# Patient Record
Sex: Male | Born: 1970 | Race: White | Hispanic: Yes | Smoking: Former smoker
Health system: Southern US, Community
[De-identification: ages and names within clinical notes are randomized; demographics above are authoritative.]

## PROBLEM LIST (undated history)

## (undated) DIAGNOSIS — A539 Syphilis, unspecified: Secondary | ICD-10-CM

## (undated) DIAGNOSIS — N186 End stage renal disease: Secondary | ICD-10-CM

## (undated) DIAGNOSIS — G479 Sleep disorder, unspecified: Secondary | ICD-10-CM

## (undated) DIAGNOSIS — K219 Gastro-esophageal reflux disease without esophagitis: Secondary | ICD-10-CM

## (undated) DIAGNOSIS — Z7189 Other specified counseling: Secondary | ICD-10-CM

## (undated) DIAGNOSIS — E1165 Type 2 diabetes mellitus with hyperglycemia: Secondary | ICD-10-CM

## (undated) DIAGNOSIS — R0989 Other specified symptoms and signs involving the circulatory and respiratory systems: Secondary | ICD-10-CM

## (undated) DIAGNOSIS — L299 Pruritus, unspecified: Secondary | ICD-10-CM

## (undated) DIAGNOSIS — F329 Major depressive disorder, single episode, unspecified: Secondary | ICD-10-CM

## (undated) DIAGNOSIS — R5381 Other malaise: Secondary | ICD-10-CM

## (undated) DIAGNOSIS — D72829 Elevated white blood cell count, unspecified: Secondary | ICD-10-CM

## (undated) DIAGNOSIS — I10 Essential (primary) hypertension: Secondary | ICD-10-CM

## (undated) DIAGNOSIS — D638 Anemia in other chronic diseases classified elsewhere: Secondary | ICD-10-CM

## (undated) DIAGNOSIS — F32A Depression, unspecified: Secondary | ICD-10-CM

## (undated) DIAGNOSIS — I639 Cerebral infarction, unspecified: Secondary | ICD-10-CM

## (undated) DIAGNOSIS — E785 Hyperlipidemia, unspecified: Secondary | ICD-10-CM

## (undated) DIAGNOSIS — D649 Anemia, unspecified: Secondary | ICD-10-CM

## (undated) DIAGNOSIS — R519 Headache, unspecified: Secondary | ICD-10-CM

## (undated) DIAGNOSIS — Z515 Encounter for palliative care: Secondary | ICD-10-CM

## (undated) DIAGNOSIS — F419 Anxiety disorder, unspecified: Secondary | ICD-10-CM

## (undated) DIAGNOSIS — R0600 Dyspnea, unspecified: Secondary | ICD-10-CM

## (undated) DIAGNOSIS — Z89512 Acquired absence of left leg below knee: Secondary | ICD-10-CM

## (undated) DIAGNOSIS — N5082 Scrotal pain: Secondary | ICD-10-CM

## (undated) DIAGNOSIS — E1151 Type 2 diabetes mellitus with diabetic peripheral angiopathy without gangrene: Secondary | ICD-10-CM

## (undated) DIAGNOSIS — K5901 Slow transit constipation: Secondary | ICD-10-CM

## (undated) DIAGNOSIS — G4730 Sleep apnea, unspecified: Secondary | ICD-10-CM

## (undated) DIAGNOSIS — N5089 Other specified disorders of the male genital organs: Secondary | ICD-10-CM

## (undated) DIAGNOSIS — R339 Retention of urine, unspecified: Secondary | ICD-10-CM

## (undated) HISTORY — DX: Acquired absence of left leg below knee: Z89.512

## (undated) HISTORY — DX: Pruritus, unspecified: L29.9

## (undated) HISTORY — DX: Other specified symptoms and signs involving the circulatory and respiratory systems: R09.89

## (undated) HISTORY — DX: Retention of urine, unspecified: R33.9

## (undated) HISTORY — DX: End stage renal disease: N18.6

## (undated) HISTORY — DX: Encounter for palliative care: Z51.5

## (undated) HISTORY — DX: Other disorders of calcium metabolism: E83.59

## (undated) HISTORY — DX: Sleep disorder, unspecified: G47.9

## (undated) HISTORY — DX: Essential (primary) hypertension: I10

## (undated) HISTORY — PX: EYE SURGERY: SHX253

## (undated) HISTORY — DX: Elevated white blood cell count, unspecified: D72.829

## (undated) HISTORY — DX: Anemia in other chronic diseases classified elsewhere: D63.8

## (undated) HISTORY — DX: Other specified counseling: Z71.89

## (undated) HISTORY — DX: Other specified disorders of the male genital organs: N50.89

## (undated) HISTORY — DX: Slow transit constipation: K59.01

## (undated) HISTORY — DX: Scrotal pain: N50.82

## (undated) HISTORY — DX: Type 2 diabetes mellitus with hyperglycemia: E11.65

## (undated) HISTORY — PX: OTHER SURGICAL HISTORY: SHX169

---
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
## 2013-07-04 DIAGNOSIS — F1090 Alcohol use, unspecified, uncomplicated: Secondary | ICD-10-CM

## 2013-07-04 DIAGNOSIS — Z7289 Other problems related to lifestyle: Secondary | ICD-10-CM | POA: Insufficient documentation

## 2013-07-04 DIAGNOSIS — IMO0001 Reserved for inherently not codable concepts without codable children: Secondary | ICD-10-CM

## 2013-07-04 DIAGNOSIS — E1169 Type 2 diabetes mellitus with other specified complication: Secondary | ICD-10-CM

## 2013-07-04 DIAGNOSIS — E669 Obesity, unspecified: Secondary | ICD-10-CM

## 2013-07-04 HISTORY — DX: Reserved for inherently not codable concepts without codable children: IMO0001

## 2013-07-04 HISTORY — DX: Alcohol use, unspecified, uncomplicated: F10.90

## 2013-07-04 HISTORY — DX: Type 2 diabetes mellitus with other specified complication: E11.69

## 2013-07-04 HISTORY — DX: Other problems related to lifestyle: Z72.89

## 2013-07-04 HISTORY — DX: Type 2 diabetes mellitus with other specified complication: E66.9

## 2013-07-20 DIAGNOSIS — Z89512 Acquired absence of left leg below knee: Secondary | ICD-10-CM | POA: Insufficient documentation

## 2013-09-22 DIAGNOSIS — I87309 Chronic venous hypertension (idiopathic) without complications of unspecified lower extremity: Secondary | ICD-10-CM

## 2013-09-22 HISTORY — DX: Chronic venous hypertension (idiopathic) without complications of unspecified lower extremity: I87.309

## 2013-12-14 DIAGNOSIS — E113599 Type 2 diabetes mellitus with proliferative diabetic retinopathy without macular edema, unspecified eye: Secondary | ICD-10-CM

## 2013-12-14 HISTORY — DX: Type 2 diabetes mellitus with proliferative diabetic retinopathy without macular edema, unspecified eye: E11.3599

## 2015-08-21 DIAGNOSIS — D509 Iron deficiency anemia, unspecified: Secondary | ICD-10-CM | POA: Diagnosis not present

## 2015-08-21 DIAGNOSIS — E119 Type 2 diabetes mellitus without complications: Secondary | ICD-10-CM | POA: Diagnosis not present

## 2017-02-12 ENCOUNTER — Ambulatory Visit (INDEPENDENT_AMBULATORY_CARE_PROVIDER_SITE_OTHER): Payer: Medicaid Other | Admitting: Sports Medicine

## 2017-02-12 ENCOUNTER — Encounter: Payer: Self-pay | Admitting: Sports Medicine

## 2017-02-12 VITALS — Ht 70.0 in | Wt 280.0 lb

## 2017-02-12 DIAGNOSIS — Z89512 Acquired absence of left leg below knee: Secondary | ICD-10-CM | POA: Diagnosis not present

## 2017-02-12 DIAGNOSIS — L601 Onycholysis: Secondary | ICD-10-CM

## 2017-02-12 DIAGNOSIS — B351 Tinea unguium: Secondary | ICD-10-CM

## 2017-02-12 DIAGNOSIS — M79676 Pain in unspecified toe(s): Secondary | ICD-10-CM

## 2017-02-12 DIAGNOSIS — E1142 Type 2 diabetes mellitus with diabetic polyneuropathy: Secondary | ICD-10-CM | POA: Diagnosis not present

## 2017-02-12 DIAGNOSIS — M79674 Pain in right toe(s): Secondary | ICD-10-CM

## 2017-02-12 NOTE — Progress Notes (Signed)
   Subjective:    [name], male    DOB: [birth year], 46 y.o.   MRN: [id number]  HPI   I taking a shower yesterday when I noticed my right great toe nail had turned black when I touched it the whole nail moved I am Diabetic so I panicked and came start here this morning for an appointment.     Review of Systems  Constitutional: Positive for fatigue.  HENT: Positive for ear pain.   Gastrointestinal: Positive for diarrhea.  Musculoskeletal: Positive for back pain.  Neurological: Positive for headaches.  All other systems reviewed and are negative.      Objective:   Physical Exam        Assessment & Plan:

## 2017-02-12 NOTE — Progress Notes (Signed)
Subjective: Michael Riggs is a 46 y.o. male patient with history of diabetes who presents to office today complaining of long, painful nails especially right 1st toenail that is loose while ambulating in shoes; unable to trim. Patient states that the glucose reading this morning was not recorded. Patient denies any new changes in medication or new problems. Patient admits to some numbness on the right and a history of Left BKA 2 years ago. States that when he saw the nail was loose he became worried and wanted to have it checked.  There are no active problems to display for this patient.  No current outpatient prescriptions on file prior to visit.   No current facility-administered medications on file prior to visit.    No Known Allergies  No results found for this or any previous visit (from the past 2160 hour(s)).  Objective: General: Patient is awake, alert, and oriented x 3 and in no acute distress.  Integument: Skin is warm, dry and supple on right.  Nails are tender, long, thickened and dystrophic with subungual debris, consistent with onychomycosis, 1-5 on right with distal lysis and dry blood at right 1st toe. No signs of infection. No open lesions or preulcerative lesions present on right. Remaining integument unremarkable.  Vasculature:  Dorsalis Pedis pulse 1/4 right. Posterior Tibial pulse  1/4 right.  Capillary fill time <3 sec 1-5 on right. Positive hair growth to the level of the digits.Temperature gradient within normal limits. No varicosities present on right. No edema present on right.  Neurology: The patient has absent sensation measured with a 5.07/10g Semmes Weinstein Monofilament at all pedal sites on right. Vibratory sensation absent on right with tuning fork.  Musculoskeletal: Left BKA.  Assessment and Plan: Problem List Items Addressed This Visit    None    Visit Diagnoses    Onycholysis of toenail    -  Primary   Dermatophytosis of nail       Toe  pain, right       Diabetic polyneuropathy associated with type 2 diabetes mellitus (HCC)       Relevant Medications   glipiZIDE (GLUCOTROL) 10 MG tablet   citalopram (CELEXA) 40 MG tablet   losartan-hydrochlorothiazide (HYZAAR) 100-12.5 MG tablet   Hx of BKA, left (HCC)         -Examined patient. -Discussed and educated patient on diabetic foot care, especially with  regards to the vascular, neurological and musculoskeletal systems.  -Stressed the importance of good glycemic control and the detriment of not  controlling glucose levels in relation to the foot. -Mechanically debrided all nails 1-5 on right using sterile nail nipper and filed with dremel without incident  -Applied antibiotic cream to right 1st toenail bed for preventative measures and advised patient to do the same for 1 week -Answered all patient questions -Patient to return  in 2.5 months for at risk foot care -Patient advised to call the office if any problems or questions arise in the meantime.  Landis Martins, DPM

## 2017-04-21 ENCOUNTER — Encounter (INDEPENDENT_AMBULATORY_CARE_PROVIDER_SITE_OTHER): Payer: Self-pay

## 2017-04-21 ENCOUNTER — Ambulatory Visit (INDEPENDENT_AMBULATORY_CARE_PROVIDER_SITE_OTHER): Payer: Medicaid Other | Admitting: Sports Medicine

## 2017-04-21 DIAGNOSIS — B351 Tinea unguium: Secondary | ICD-10-CM | POA: Diagnosis not present

## 2017-04-21 DIAGNOSIS — Z89512 Acquired absence of left leg below knee: Secondary | ICD-10-CM

## 2017-04-21 DIAGNOSIS — E1142 Type 2 diabetes mellitus with diabetic polyneuropathy: Secondary | ICD-10-CM

## 2017-04-21 DIAGNOSIS — M79676 Pain in unspecified toe(s): Secondary | ICD-10-CM | POA: Diagnosis not present

## 2017-04-21 DIAGNOSIS — M79674 Pain in right toe(s): Secondary | ICD-10-CM

## 2017-04-21 NOTE — Progress Notes (Signed)
Subjective: Michael Riggs is a 46 y.o. male patient with history of diabetes who returns to office today complaining of long, painful nail while ambulating in shoes; unable to trim. Patient states that the glucose reading this morning was 387, on insulin now at bedtime. Patient denies any new changes in medication or new problems.   There are no active problems to display for this patient.  Current Outpatient Prescriptions on File Prior to Visit  Medication Sig Dispense Refill  . citalopram (CELEXA) 40 MG tablet TK 1 T PO ONCE D  0  . glipiZIDE (GLUCOTROL) 10 MG tablet TK 1 T PO BID  2  . losartan-hydrochlorothiazide (HYZAAR) 100-12.5 MG tablet TK 1 T PO QD  2  . omeprazole (PRILOSEC) 40 MG capsule Take 40 mg by mouth daily.     No current facility-administered medications on file prior to visit.    No Known Allergies  No results found for this or any previous visit (from the past 2160 hour(s)).  Objective: General: Patient is awake, alert, and oriented x 3 and in no acute distress.  Integument: Skin is warm, dry and supple on right.  Nails are tender, long, thickened and dystrophic with subungual debris, consistent with onychomycosis, 1-5 on right. There is dry blood in nail bed at right 1st toe from previous lysis of nail. No signs of infection. No open lesions or preulcerative lesions present on right. Remaining integument unremarkable.  Vasculature:  Dorsalis Pedis pulse 1/4 right. Posterior Tibial pulse  1/4 right.  Capillary fill time <3 sec 1-5 on right. Positive hair growth to the level of the digits.Temperature gradient within normal limits. No varicosities present on right. No edema present on right.  Neurology: The patient has absent sensation measured with a 5.07/10g Semmes Weinstein Monofilament at all pedal sites on right. Vibratory sensation absent on right with tuning fork.  Musculoskeletal: Left BKA.  Assessment and Plan: Problem List Items Addressed This Visit     None    Visit Diagnoses    Dermatophytosis of nail    -  Primary   Toe pain, right       Diabetic polyneuropathy associated with type 2 diabetes mellitus (HCC)       Hx of BKA, left ([place])         -Examined patient. -Discussed and educated patient on diabetic foot care, especially with  regards to the vascular, neurological and musculoskeletal systems.  -Stressed the importance of good glycemic control and the detriment of not  controlling glucose levels in relation to the foot. -Mechanically debrided all nails 1-5 on right using sterile nail nipper and filed with dremel without incident  -Answered all patient questions -Patient to return  in 2.5 months for at risk foot care -Patient advised to call the office if any problems or questions arise in the meantime.  Landis Martins, DPM

## 2017-06-23 ENCOUNTER — Ambulatory Visit: Payer: Medicaid Other | Admitting: Sports Medicine

## 2017-08-26 ENCOUNTER — Ambulatory Visit (INDEPENDENT_AMBULATORY_CARE_PROVIDER_SITE_OTHER): Payer: Medicare Other | Admitting: Sports Medicine

## 2017-08-26 ENCOUNTER — Encounter: Payer: Self-pay | Admitting: Sports Medicine

## 2017-08-26 DIAGNOSIS — B351 Tinea unguium: Secondary | ICD-10-CM

## 2017-08-26 DIAGNOSIS — M79674 Pain in right toe(s): Secondary | ICD-10-CM

## 2017-08-26 DIAGNOSIS — Z89512 Acquired absence of left leg below knee: Secondary | ICD-10-CM

## 2017-08-26 DIAGNOSIS — E1142 Type 2 diabetes mellitus with diabetic polyneuropathy: Secondary | ICD-10-CM

## 2017-08-26 NOTE — Progress Notes (Signed)
Subjective: Michael Riggs is a 47 y.o. male patient with history of diabetes who returns to office today complaining of long, painful nail while ambulating in shoes; unable to trim. Patient states that the glucose reading this morning was not recorded, on victoza after having high blood sugars and went to ER because of it and peeing blood. Patient denies any other issues.  There are no active problems to display for this patient.  Current Outpatient Medications on File Prior to Visit  Medication Sig Dispense Refill  . citalopram (CELEXA) 40 MG tablet TK 1 T PO ONCE D  0  . gabapentin (NEURONTIN) 300 MG capsule TK 1 C PO TID  2  . glipiZIDE (GLUCOTROL) 10 MG tablet TK 1 T PO BID  2  . losartan-hydrochlorothiazide (HYZAAR) 100-12.5 MG tablet TK 1 T PO QD  2  . omeprazole (PRILOSEC) 40 MG capsule Take 40 mg by mouth daily.    Marland Kitchen LEVEMIR 100 UNIT/ML injection   0   No current facility-administered medications on file prior to visit.    No Known Allergies  No results found for this or any previous visit (from the past 2160 hour(s)).  Objective: General: Patient is awake, alert, and oriented x 3 and in no acute distress.  Integument: Skin is warm, dry and supple on right.  Nails are tender, long, thickened and dystrophic with subungual debris, consistent with onychomycosis, 1-5 on right. There is dry blood in nail bed at right 1st toe from previous lysis of nail. No signs of infection. No open lesions or preulcerative lesions present on right. Remaining integument unremarkable.  Vasculature:  Dorsalis Pedis pulse 1/4 right. Posterior Tibial pulse  1/4 right.  Capillary fill time <3 sec 1-5 on right. Positive hair growth to the level of the digits.Temperature gradient within normal limits. No varicosities present on right. No edema present on right.  Neurology: The patient has absent sensation measured with a 5.07/10g Semmes Weinstein Monofilament at all pedal sites on right. Vibratory  sensation absent on right with tuning fork.  Musculoskeletal: Left BKA.  Assessment and Plan: Problem List Items Addressed This Visit    None    Visit Diagnoses    Dermatophytosis of nail    -  Primary   Toe pain, right       Diabetic polyneuropathy associated with type 2 diabetes mellitus (HCC)       Hx of BKA, left ([place])         -Examined patient. -Discussed and educated patient on diabetic foot care, especially with  regards to the vascular, neurological and musculoskeletal systems.  -Stressed the importance of good glycemic control and the detriment of not  controlling glucose levels in relation to the foot. -Mechanically debrided all nails 1-5 on right using sterile nail nipper and filed with dremel without incident  -Answered all patient questions -Patient to return  in 2.5 months for at risk foot care -Patient advised to call the office if any problems or questions arise in the meantime.  Landis Martins, DPM

## 2017-11-04 ENCOUNTER — Ambulatory Visit: Payer: Medicare Other | Admitting: Sports Medicine

## 2017-11-18 ENCOUNTER — Ambulatory Visit (INDEPENDENT_AMBULATORY_CARE_PROVIDER_SITE_OTHER): Payer: Medicare Other | Admitting: Sports Medicine

## 2017-11-18 ENCOUNTER — Encounter: Payer: Self-pay | Admitting: Sports Medicine

## 2017-11-18 DIAGNOSIS — E1142 Type 2 diabetes mellitus with diabetic polyneuropathy: Secondary | ICD-10-CM | POA: Diagnosis not present

## 2017-11-18 DIAGNOSIS — B351 Tinea unguium: Secondary | ICD-10-CM

## 2017-11-18 DIAGNOSIS — Z89512 Acquired absence of left leg below knee: Secondary | ICD-10-CM | POA: Diagnosis not present

## 2017-11-18 DIAGNOSIS — M79674 Pain in right toe(s): Secondary | ICD-10-CM

## 2017-11-18 NOTE — Progress Notes (Signed)
Subjective: Michael Riggs is a 47 y.o. male patient with history of diabetes who returns to office today complaining of long, painful nail while ambulating in shoes; unable to trim. Patient states that the glucose reading this morning was 120 and last saw his primary care doctor 2 months ago.  Admits to a history of a fall and scraping the right great toe which is healing very well. Patient denies any other issues.  There are no active problems to display for this patient.  Current Outpatient Medications on File Prior to Visit  Medication Sig Dispense Refill  . citalopram (CELEXA) 40 MG tablet TK 1 T PO ONCE D  0  . gabapentin (NEURONTIN) 300 MG capsule TK 1 C PO TID  2  . glipiZIDE (GLUCOTROL) 10 MG tablet TK 1 T PO BID  2  . LEVEMIR 100 UNIT/ML injection   0  . losartan-hydrochlorothiazide (HYZAAR) 100-12.5 MG tablet TK 1 T PO QD  2  . omeprazole (PRILOSEC) 40 MG capsule Take 40 mg by mouth daily.    Marland Kitchen VICTOZA 18 MG/3ML SOPN   2   No current facility-administered medications on file prior to visit.    No Known Allergies  No results found for this or any previous visit (from the past 2160 hour(s)).  Objective: General: Patient is awake, alert, and oriented x 3 and in no acute distress.  Integument: Skin is warm, dry and supple on right.  Nails are tender, long, thickened and dystrophic with subungual debris, consistent with onychomycosis, 1-5 on right. There is a small scrape the right great toe with no signs of infection.  Moderate dry skin.  Remaining integument unremarkable.  Vasculature:  Dorsalis Pedis pulse 1/4 right. Posterior Tibial pulse  1/4 right.  Capillary fill time <3 sec 1-5 on right. Positive hair growth to the level of the digits.Temperature gradient within normal limits. No varicosities present on right. No edema present on right.  Neurology: The patient has absent sensation measured with a 5.07/10g Semmes Weinstein Monofilament at all pedal sites on right.  Vibratory sensation absent on right with tuning fork.  Musculoskeletal: Left BKA.  Assessment and Plan: Problem List Items Addressed This Visit    None    Visit Diagnoses    Dermatophytosis of nail    -  Primary   Toe pain, right       Diabetic polyneuropathy associated with type 2 diabetes mellitus (HCC)       Hx of BKA, left ([place])         -Examined patient. -Discussed and educated patient on diabetic foot care, especially with  regards to the vascular, neurological and musculoskeletal systems.  -Stressed the importance of good glycemic control and the detriment of not  controlling glucose levels in relation to the foot. -Mechanically debrided all nails 1-5 on right using sterile nail nipper and filed with dremel without incident  -Applied antibiotic cream and Band-Aid to right great toe advised patient to do the same for the next week -Recommend patient to use daily skin emollients for dry skin -Answered all patient questions -Patient to return  in 3 months for at risk foot care -Patient advised to call the office if any problems or questions arise in the meantime.  Landis Martins, DPM

## 2018-02-17 ENCOUNTER — Ambulatory Visit (INDEPENDENT_AMBULATORY_CARE_PROVIDER_SITE_OTHER): Payer: Medicare Other | Admitting: Sports Medicine

## 2018-02-17 ENCOUNTER — Encounter: Payer: Self-pay | Admitting: Sports Medicine

## 2018-02-17 DIAGNOSIS — B351 Tinea unguium: Secondary | ICD-10-CM

## 2018-02-17 DIAGNOSIS — M79674 Pain in right toe(s): Secondary | ICD-10-CM | POA: Diagnosis not present

## 2018-02-17 DIAGNOSIS — Z89512 Acquired absence of left leg below knee: Secondary | ICD-10-CM

## 2018-02-17 DIAGNOSIS — E1142 Type 2 diabetes mellitus with diabetic polyneuropathy: Secondary | ICD-10-CM | POA: Diagnosis not present

## 2018-02-17 NOTE — Progress Notes (Signed)
Subjective: Laroy Mustard is a 47 y.o. male patient with history of diabetes who returns to office today complaining of long, painful nail while ambulating in shoes; unable to trim. Patient states that the glucose reading this morning was 114 and last saw his primary care doctor 1 month ago, a1c 6.5. States that he stopped drinking since 2 sundays ago. Patient denies any other issues.  There are no active problems to display for this patient.  Current Outpatient Medications on File Prior to Visit  Medication Sig Dispense Refill  . citalopram (CELEXA) 40 MG tablet TK 1 T PO ONCE D  0  . gabapentin (NEURONTIN) 300 MG capsule TK 1 C PO TID  2  . glipiZIDE (GLUCOTROL) 10 MG tablet TK 1 T PO BID  2  . LEVEMIR 100 UNIT/ML injection   0  . losartan-hydrochlorothiazide (HYZAAR) 100-12.5 MG tablet TK 1 T PO QD  2  . omeprazole (PRILOSEC) 40 MG capsule Take 40 mg by mouth daily.    Marland Kitchen VICTOZA 18 MG/3ML SOPN   2   No current facility-administered medications on file prior to visit.    No Known Allergies  No results found for this or any previous visit (from the past 2160 hour(s)).  Objective: General: Patient is awake, alert, and oriented x 3 and in no acute distress.  Integument: Skin is warm and dry on right.  Nails are tender, long, thickened and dystrophic with subungual debris, consistent with onychomycosis, 1-5 on right.   Remaining integument unremarkable.  Vasculature:  Dorsalis Pedis pulse 1/4 right. Posterior Tibial pulse  0/4 right.  Capillary fill time <3 sec 1-5 on right. Diminished hair growth to the level of the digits.Temperature gradient within normal limits. No varicosities present on right. No edema present on right but trophic skin changes.  Neurology: The patient has absent sensation measured with a 5.07/10g Semmes Weinstein Monofilament at all pedal sites on right. Vibratory sensation absent on right with tuning fork.  Musculoskeletal: Left BKA.  Assessment and  Plan: Problem List Items Addressed This Visit    None    Visit Diagnoses    Dermatophytosis of nail    -  Primary   Toe pain, right       Diabetic polyneuropathy associated with type 2 diabetes mellitus (HCC)       Hx of BKA, left ([place])         -Examined patient. -Discussed and educated patient on diabetic foot care, especially with  regards to the vascular, neurological and musculoskeletal systems.  -Stressed the importance of good glycemic control and the detriment of not  controlling glucose levels in relation to the foot. -Mechanically debrided all nails 1-5 on right using sterile nail nipper and filed with dremel without incident  -Recommend patient to use daily skin emollients for dry skin -Patient to return  in 3 months for at risk foot care -Patient advised to call the office if any problems or questions arise in the meantime.  Landis Martins, DPM

## 2018-05-19 ENCOUNTER — Ambulatory Visit (INDEPENDENT_AMBULATORY_CARE_PROVIDER_SITE_OTHER): Payer: Medicare Other | Admitting: Sports Medicine

## 2018-05-19 ENCOUNTER — Encounter: Payer: Self-pay | Admitting: Sports Medicine

## 2018-05-19 VITALS — BP 176/115 | HR 93 | Resp 16

## 2018-05-19 DIAGNOSIS — E1142 Type 2 diabetes mellitus with diabetic polyneuropathy: Secondary | ICD-10-CM

## 2018-05-19 DIAGNOSIS — M79674 Pain in right toe(s): Secondary | ICD-10-CM | POA: Diagnosis not present

## 2018-05-19 DIAGNOSIS — Z89512 Acquired absence of left leg below knee: Secondary | ICD-10-CM | POA: Diagnosis not present

## 2018-05-19 DIAGNOSIS — B351 Tinea unguium: Secondary | ICD-10-CM

## 2018-05-19 NOTE — Progress Notes (Signed)
Subjective: Michael Riggs is a 47 y.o. male patient with history of diabetes who returns to office today complaining of long, painful nail while ambulating in shoes; unable to trim. Patient states that the glucose reading this morning was 118 and last saw his primary care doctor a few weeks ago, a1c 6.5. Patient denies any other issues.  Patient Active Problem List   Diagnosis Date Noted  . PDR (proliferative diabetic retinopathy) ([place]) 12/14/2013  . Venous hypertension 09/22/2013  . Diabetes mellitus type 2 in obese ([place]) 07/04/2013  . Habitual alcohol use 07/04/2013  . 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 citalopram (CELEXA) 40 MG tablet TK 1 T PO ONCE D  0  . gabapentin (NEURONTIN) 300 MG capsule TK 1 C PO TID  2  . glipiZIDE (GLUCOTROL) 10 MG tablet TK 1 T PO BID  2  . hydrOXYzine (ATARAX/VISTARIL) 25 MG tablet TK 1 T PO Q 8 H PRA  0  . LEVEMIR 100 UNIT/ML injection   0  . losartan-hydrochlorothiazide (HYZAAR) 100-12.5 MG tablet TK 1 T PO QD  2  . omeprazole (PRILOSEC) 40 MG capsule Take 40 mg by mouth daily.    Marland Kitchen VICTOZA 18 MG/3ML SOPN   2   No current facility-administered medications on file prior to visit.    No Known Allergies  No results found for this or any previous visit (from the past 2160 hour(s)).  Objective: General: Patient is awake, alert, and oriented x 3 and in no acute distress.  Integument: Skin is warm and dry on right.  Nails are tender, long, thickened and dystrophic with subungual debris, consistent with onychomycosis, 1-5 on right.   Remaining integument unremarkable.  Vasculature:  Dorsalis Pedis pulse 1/4 right. Posterior Tibial pulse  0/4 right.  Capillary fill time <3 sec 1-5 on right. Diminished hair growth to the level of the digits.Temperature gradient within normal limits. No varicosities present on right. No edema present on  right but trophic skin changes.  Neurology: The patient has absent sensation measured with a 5.07/10g Semmes Weinstein Monofilament at all pedal sites on right. Vibratory sensation absent on right with tuning fork.  Musculoskeletal: Left BKA.  Assessment and Plan: Problem List Items Addressed This Visit    None    Visit Diagnoses    Dermatophytosis of nail    -  Primary   Toe pain, right       Diabetic polyneuropathy associated with type 2 diabetes mellitus (HCC)       Relevant Medications   hydrOXYzine (ATARAX/VISTARIL) 25 MG tablet   Hx of BKA, left ([place])         -Examined patient. -Discussed and educated patient on diabetic foot care, especially with  regards to the vascular, neurological and musculoskeletal systems.  -Mechanically debrided all nails 1-5 on right using sterile nail nipper and filed with dremel without incident  -Recommend patient to use daily skin emollients for dry skin as previously recommended -Patient to return  in 3 months for at risk foot care -Patient advised to call the office if any problems or questions arise in the meantime.  Landis Martins, DPM

## 2018-08-18 ENCOUNTER — Ambulatory Visit: Payer: Medicare Other | Admitting: Sports Medicine

## 2018-08-19 ENCOUNTER — Encounter: Payer: Self-pay | Admitting: Sports Medicine

## 2018-08-19 ENCOUNTER — Telehealth: Payer: Self-pay | Admitting: *Deleted

## 2018-08-19 ENCOUNTER — Ambulatory Visit (INDEPENDENT_AMBULATORY_CARE_PROVIDER_SITE_OTHER): Payer: Medicare Other | Admitting: Sports Medicine

## 2018-08-19 VITALS — BP 163/106 | HR 98 | Resp 16

## 2018-08-19 DIAGNOSIS — Z89512 Acquired absence of left leg below knee: Secondary | ICD-10-CM

## 2018-08-19 DIAGNOSIS — E1142 Type 2 diabetes mellitus with diabetic polyneuropathy: Secondary | ICD-10-CM | POA: Diagnosis not present

## 2018-08-19 DIAGNOSIS — B351 Tinea unguium: Secondary | ICD-10-CM | POA: Diagnosis not present

## 2018-08-19 DIAGNOSIS — M79674 Pain in right toe(s): Secondary | ICD-10-CM

## 2018-08-19 DIAGNOSIS — I739 Peripheral vascular disease, unspecified: Secondary | ICD-10-CM

## 2018-08-19 NOTE — Telephone Encounter (Signed)
Faxed referral, clinicals and demographics [facility].

## 2018-08-19 NOTE — Telephone Encounter (Signed)
-----   Message from Landis Martins, [place] sent at 08/19/2018 11:36 AM EST ----- Regarding: Vascular consult Patient would like to see Vascular in [place] for check on his right leg, hx of left BKA and PVD -Dr. Cannon Kettle

## 2018-08-19 NOTE — Progress Notes (Signed)
Subjective: Michael Riggs is a 48 y.o. male patient with history of diabetes who returns to office today complaining of long, painful nail while ambulating in shoes; unable to trim. Patient states that the glucose reading this morning was 122 yesterday and last saw his primary care doctor a few weeks ago in Dec, a1c 6.5. Patient denies any other issues.  Patient Active Problem List   Diagnosis Date Noted  . PDR (proliferative diabetic retinopathy) ([place]) 12/14/2013  . Venous hypertension 09/22/2013  . Diabetes mellitus type 2 in obese ([place]) 07/04/2013  . Habitual alcohol use 07/04/2013  . 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 atorvastatin (LIPITOR) 40 MG tablet TK 1 T PO D    . citalopram (CELEXA) 40 MG tablet TK 1 T PO ONCE D  0  . gabapentin (NEURONTIN) 300 MG capsule TK 1 C PO TID  2  . glipiZIDE (GLUCOTROL) 10 MG tablet TK 1 T PO BID  2  . hydrOXYzine (ATARAX/VISTARIL) 25 MG tablet TK 1 T PO Q 8 H PRA  0  . LEVEMIR 100 UNIT/ML injection   0  . losartan-hydrochlorothiazide (HYZAAR) 100-12.5 MG tablet TK 1 T PO QD  2  . NOVOFINE PLUS 32G X 4 MM MISC     . omeprazole (PRILOSEC) 40 MG capsule Take 40 mg by mouth daily.    . ondansetron (ZOFRAN) 4 MG tablet TK 1 T PO D PRN    . VICTOZA 18 MG/3ML SOPN   2   No current facility-administered medications on file prior to visit.    No Known Allergies  No results found for this or any previous visit (from the past 2160 hour(s)).  Objective: General: Patient is awake, alert, and oriented x 3 and in no acute distress.  Integument: Skin is warm and dry on right.  Nails are tender, long, thickened and dystrophic with subungual debris, consistent with onychomycosis, 1-5 on right.   Remaining integument unremarkable.  Vasculature:  Dorsalis Pedis pulse 1/4 right. Posterior Tibial pulse  0/4 right.  Capillary fill time <3 sec  1-5 on right. Diminished hair growth to the level of the digits.Temperature gradient within normal limits. No varicosities present on right. No edema present on right but trophic skin changes.  Neurology: The patient has absent sensation measured with a 5.07/10g Semmes Weinstein Monofilament at all pedal sites on right. Vibratory sensation absent on right with tuning fork.  Musculoskeletal: Left BKA.  Assessment and Plan: Problem List Items Addressed This Visit    None    Visit Diagnoses    Dermatophytosis of nail    -  Primary   Toe pain, right       Diabetic polyneuropathy associated with type 2 diabetes mellitus (HCC)       Relevant Medications   atorvastatin (LIPITOR) 40 MG tablet   Hx of BKA, left ([place])         -Examined patient. -Discussed and educated patient on diabetic foot care, especially with  regards to the vascular, neurological and musculoskeletal systems.  -Mechanically debrided all nails 1-5 on right using sterile nail nipper and filed with dremel without incident  -Recommend patient to use daily skin emollients for dry skin as previously recommended -Consult placed to vascular for preventative measures due to hx of BKA and past ETOH abuse -Patient to return  in 3 months for at risk foot care -Patient advised  to call the office if any problems or questions arise in the meantime.  Landis Martins, DPM

## 2018-08-22 DIAGNOSIS — E118 Type 2 diabetes mellitus with unspecified complications: Secondary | ICD-10-CM | POA: Diagnosis not present

## 2018-08-31 DIAGNOSIS — Z89512 Acquired absence of left leg below knee: Secondary | ICD-10-CM | POA: Diagnosis not present

## 2018-08-31 DIAGNOSIS — E118 Type 2 diabetes mellitus with unspecified complications: Secondary | ICD-10-CM | POA: Diagnosis not present

## 2018-08-31 DIAGNOSIS — M79604 Pain in right leg: Secondary | ICD-10-CM | POA: Diagnosis not present

## 2018-08-31 DIAGNOSIS — E785 Hyperlipidemia, unspecified: Secondary | ICD-10-CM | POA: Diagnosis not present

## 2018-09-05 DIAGNOSIS — E118 Type 2 diabetes mellitus with unspecified complications: Secondary | ICD-10-CM | POA: Diagnosis not present

## 2018-09-05 DIAGNOSIS — E785 Hyperlipidemia, unspecified: Secondary | ICD-10-CM | POA: Diagnosis not present

## 2018-09-05 DIAGNOSIS — M79604 Pain in right leg: Secondary | ICD-10-CM | POA: Diagnosis not present

## 2018-09-05 DIAGNOSIS — Z89512 Acquired absence of left leg below knee: Secondary | ICD-10-CM | POA: Diagnosis not present

## 2018-09-20 DIAGNOSIS — M79604 Pain in right leg: Secondary | ICD-10-CM | POA: Diagnosis not present

## 2018-09-20 DIAGNOSIS — E785 Hyperlipidemia, unspecified: Secondary | ICD-10-CM | POA: Diagnosis not present

## 2018-09-20 DIAGNOSIS — E118 Type 2 diabetes mellitus with unspecified complications: Secondary | ICD-10-CM | POA: Diagnosis not present

## 2018-10-26 DIAGNOSIS — E118 Type 2 diabetes mellitus with unspecified complications: Secondary | ICD-10-CM | POA: Diagnosis not present

## 2018-10-26 DIAGNOSIS — E785 Hyperlipidemia, unspecified: Secondary | ICD-10-CM | POA: Diagnosis not present

## 2018-10-26 DIAGNOSIS — M79604 Pain in right leg: Secondary | ICD-10-CM | POA: Diagnosis not present

## 2018-10-27 DIAGNOSIS — E118 Type 2 diabetes mellitus with unspecified complications: Secondary | ICD-10-CM | POA: Diagnosis not present

## 2018-11-23 DIAGNOSIS — L0390 Cellulitis, unspecified: Secondary | ICD-10-CM | POA: Diagnosis not present

## 2018-11-23 DIAGNOSIS — M79604 Pain in right leg: Secondary | ICD-10-CM | POA: Diagnosis not present

## 2018-11-23 DIAGNOSIS — E785 Hyperlipidemia, unspecified: Secondary | ICD-10-CM | POA: Diagnosis not present

## 2018-11-23 DIAGNOSIS — Z89512 Acquired absence of left leg below knee: Secondary | ICD-10-CM | POA: Diagnosis not present

## 2018-11-23 DIAGNOSIS — E118 Type 2 diabetes mellitus with unspecified complications: Secondary | ICD-10-CM | POA: Diagnosis not present

## 2018-11-30 ENCOUNTER — Other Ambulatory Visit: Payer: Self-pay

## 2018-11-30 NOTE — Patient Outreach (Signed)
[facility]) Care Management  11/30/2018  Michael Riggs 1970-11-14 [id number]   Medication Adherence call to Michael Riggs [place] Identifiers Verify patient is past due on Atorvastatin 40 mg and Losartan/Hctz 100/25 mg patient explain he is taking 1 tablet daily on both medication and needs some he ask if we can call his pharmacy an order both medication patient was inform of 90 days supply he ask to be fill for 90 days [facility] will fill both prescription for 90 days supply and will have it ready ,patient also have question on how can he get help on a nurse for personal hygiene patient felt and can not do many of his normal activities  I refer him to the [place] line and told him that he could call back if he need it more help.Michael Riggs is showing past due under [place].   [facility] Direct Dial [phone]  Fax [phone] [email or web address]

## 2018-12-02 DIAGNOSIS — I10 Essential (primary) hypertension: Secondary | ICD-10-CM | POA: Diagnosis not present

## 2018-12-02 DIAGNOSIS — Z89512 Acquired absence of left leg below knee: Secondary | ICD-10-CM | POA: Diagnosis not present

## 2018-12-02 DIAGNOSIS — E118 Type 2 diabetes mellitus with unspecified complications: Secondary | ICD-10-CM | POA: Diagnosis not present

## 2018-12-02 DIAGNOSIS — R0600 Dyspnea, unspecified: Secondary | ICD-10-CM | POA: Diagnosis not present

## 2018-12-02 DIAGNOSIS — E785 Hyperlipidemia, unspecified: Secondary | ICD-10-CM | POA: Diagnosis not present

## 2018-12-02 DIAGNOSIS — M79604 Pain in right leg: Secondary | ICD-10-CM | POA: Diagnosis not present

## 2018-12-07 DIAGNOSIS — E1151 Type 2 diabetes mellitus with diabetic peripheral angiopathy without gangrene: Secondary | ICD-10-CM | POA: Diagnosis not present

## 2018-12-07 DIAGNOSIS — N184 Chronic kidney disease, stage 4 (severe): Secondary | ICD-10-CM | POA: Diagnosis not present

## 2018-12-07 DIAGNOSIS — Z9119 Patient's noncompliance with other medical treatment and regimen: Secondary | ICD-10-CM | POA: Diagnosis not present

## 2018-12-07 DIAGNOSIS — Z9181 History of falling: Secondary | ICD-10-CM | POA: Diagnosis not present

## 2018-12-07 DIAGNOSIS — I129 Hypertensive chronic kidney disease with stage 1 through stage 4 chronic kidney disease, or unspecified chronic kidney disease: Secondary | ICD-10-CM | POA: Diagnosis not present

## 2018-12-07 DIAGNOSIS — K219 Gastro-esophageal reflux disease without esophagitis: Secondary | ICD-10-CM | POA: Diagnosis not present

## 2018-12-07 DIAGNOSIS — Z89512 Acquired absence of left leg below knee: Secondary | ICD-10-CM | POA: Diagnosis not present

## 2018-12-07 DIAGNOSIS — L89890 Pressure ulcer of other site, unstageable: Secondary | ICD-10-CM | POA: Diagnosis not present

## 2018-12-07 DIAGNOSIS — Z7984 Long term (current) use of oral hypoglycemic drugs: Secondary | ICD-10-CM | POA: Diagnosis not present

## 2018-12-07 DIAGNOSIS — E1122 Type 2 diabetes mellitus with diabetic chronic kidney disease: Secondary | ICD-10-CM | POA: Diagnosis not present

## 2018-12-07 DIAGNOSIS — E785 Hyperlipidemia, unspecified: Secondary | ICD-10-CM | POA: Diagnosis not present

## 2018-12-09 DIAGNOSIS — E785 Hyperlipidemia, unspecified: Secondary | ICD-10-CM | POA: Diagnosis not present

## 2018-12-09 DIAGNOSIS — E1151 Type 2 diabetes mellitus with diabetic peripheral angiopathy without gangrene: Secondary | ICD-10-CM | POA: Diagnosis not present

## 2018-12-09 DIAGNOSIS — Z9119 Patient's noncompliance with other medical treatment and regimen: Secondary | ICD-10-CM | POA: Diagnosis not present

## 2018-12-09 DIAGNOSIS — L89890 Pressure ulcer of other site, unstageable: Secondary | ICD-10-CM | POA: Diagnosis not present

## 2018-12-09 DIAGNOSIS — Z9181 History of falling: Secondary | ICD-10-CM | POA: Diagnosis not present

## 2018-12-09 DIAGNOSIS — K219 Gastro-esophageal reflux disease without esophagitis: Secondary | ICD-10-CM | POA: Diagnosis not present

## 2018-12-09 DIAGNOSIS — N184 Chronic kidney disease, stage 4 (severe): Secondary | ICD-10-CM | POA: Diagnosis not present

## 2018-12-09 DIAGNOSIS — Z7984 Long term (current) use of oral hypoglycemic drugs: Secondary | ICD-10-CM | POA: Diagnosis not present

## 2018-12-09 DIAGNOSIS — I129 Hypertensive chronic kidney disease with stage 1 through stage 4 chronic kidney disease, or unspecified chronic kidney disease: Secondary | ICD-10-CM | POA: Diagnosis not present

## 2018-12-09 DIAGNOSIS — E1122 Type 2 diabetes mellitus with diabetic chronic kidney disease: Secondary | ICD-10-CM | POA: Diagnosis not present

## 2018-12-09 DIAGNOSIS — Z89512 Acquired absence of left leg below knee: Secondary | ICD-10-CM | POA: Diagnosis not present

## 2018-12-10 DIAGNOSIS — R404 Transient alteration of awareness: Secondary | ICD-10-CM | POA: Diagnosis not present

## 2018-12-10 DIAGNOSIS — E162 Hypoglycemia, unspecified: Secondary | ICD-10-CM | POA: Diagnosis not present

## 2018-12-10 DIAGNOSIS — E161 Other hypoglycemia: Secondary | ICD-10-CM | POA: Diagnosis not present

## 2018-12-10 DIAGNOSIS — R410 Disorientation, unspecified: Secondary | ICD-10-CM | POA: Diagnosis not present

## 2018-12-13 DIAGNOSIS — E118 Type 2 diabetes mellitus with unspecified complications: Secondary | ICD-10-CM | POA: Diagnosis not present

## 2018-12-13 DIAGNOSIS — N185 Chronic kidney disease, stage 5: Secondary | ICD-10-CM | POA: Diagnosis not present

## 2018-12-13 DIAGNOSIS — M79604 Pain in right leg: Secondary | ICD-10-CM | POA: Diagnosis not present

## 2018-12-14 DIAGNOSIS — Z7984 Long term (current) use of oral hypoglycemic drugs: Secondary | ICD-10-CM | POA: Diagnosis not present

## 2018-12-14 DIAGNOSIS — E1122 Type 2 diabetes mellitus with diabetic chronic kidney disease: Secondary | ICD-10-CM | POA: Diagnosis not present

## 2018-12-14 DIAGNOSIS — N184 Chronic kidney disease, stage 4 (severe): Secondary | ICD-10-CM | POA: Diagnosis not present

## 2018-12-14 DIAGNOSIS — E785 Hyperlipidemia, unspecified: Secondary | ICD-10-CM | POA: Diagnosis not present

## 2018-12-14 DIAGNOSIS — K219 Gastro-esophageal reflux disease without esophagitis: Secondary | ICD-10-CM | POA: Diagnosis not present

## 2018-12-14 DIAGNOSIS — Z89512 Acquired absence of left leg below knee: Secondary | ICD-10-CM | POA: Diagnosis not present

## 2018-12-14 DIAGNOSIS — I129 Hypertensive chronic kidney disease with stage 1 through stage 4 chronic kidney disease, or unspecified chronic kidney disease: Secondary | ICD-10-CM | POA: Diagnosis not present

## 2018-12-14 DIAGNOSIS — Z9181 History of falling: Secondary | ICD-10-CM | POA: Diagnosis not present

## 2018-12-14 DIAGNOSIS — Z9119 Patient's noncompliance with other medical treatment and regimen: Secondary | ICD-10-CM | POA: Diagnosis not present

## 2018-12-14 DIAGNOSIS — E1151 Type 2 diabetes mellitus with diabetic peripheral angiopathy without gangrene: Secondary | ICD-10-CM | POA: Diagnosis not present

## 2018-12-14 DIAGNOSIS — L89890 Pressure ulcer of other site, unstageable: Secondary | ICD-10-CM | POA: Diagnosis not present

## 2018-12-19 DIAGNOSIS — M79604 Pain in right leg: Secondary | ICD-10-CM | POA: Diagnosis not present

## 2018-12-19 DIAGNOSIS — E118 Type 2 diabetes mellitus with unspecified complications: Secondary | ICD-10-CM | POA: Diagnosis not present

## 2018-12-19 DIAGNOSIS — N185 Chronic kidney disease, stage 5: Secondary | ICD-10-CM | POA: Diagnosis not present

## 2018-12-20 DIAGNOSIS — N179 Acute kidney failure, unspecified: Secondary | ICD-10-CM | POA: Diagnosis not present

## 2018-12-20 DIAGNOSIS — E118 Type 2 diabetes mellitus with unspecified complications: Secondary | ICD-10-CM | POA: Diagnosis not present

## 2018-12-21 DIAGNOSIS — Z89512 Acquired absence of left leg below knee: Secondary | ICD-10-CM | POA: Diagnosis not present

## 2018-12-21 DIAGNOSIS — K219 Gastro-esophageal reflux disease without esophagitis: Secondary | ICD-10-CM | POA: Diagnosis not present

## 2018-12-21 DIAGNOSIS — Z9119 Patient's noncompliance with other medical treatment and regimen: Secondary | ICD-10-CM | POA: Diagnosis not present

## 2018-12-21 DIAGNOSIS — Z7984 Long term (current) use of oral hypoglycemic drugs: Secondary | ICD-10-CM | POA: Diagnosis not present

## 2018-12-21 DIAGNOSIS — E1151 Type 2 diabetes mellitus with diabetic peripheral angiopathy without gangrene: Secondary | ICD-10-CM | POA: Diagnosis not present

## 2018-12-21 DIAGNOSIS — Z9181 History of falling: Secondary | ICD-10-CM | POA: Diagnosis not present

## 2018-12-21 DIAGNOSIS — I129 Hypertensive chronic kidney disease with stage 1 through stage 4 chronic kidney disease, or unspecified chronic kidney disease: Secondary | ICD-10-CM | POA: Diagnosis not present

## 2018-12-21 DIAGNOSIS — L89890 Pressure ulcer of other site, unstageable: Secondary | ICD-10-CM | POA: Diagnosis not present

## 2018-12-21 DIAGNOSIS — N184 Chronic kidney disease, stage 4 (severe): Secondary | ICD-10-CM | POA: Diagnosis not present

## 2018-12-21 DIAGNOSIS — E785 Hyperlipidemia, unspecified: Secondary | ICD-10-CM | POA: Diagnosis not present

## 2018-12-21 DIAGNOSIS — E1122 Type 2 diabetes mellitus with diabetic chronic kidney disease: Secondary | ICD-10-CM | POA: Diagnosis not present

## 2018-12-22 DIAGNOSIS — I129 Hypertensive chronic kidney disease with stage 1 through stage 4 chronic kidney disease, or unspecified chronic kidney disease: Secondary | ICD-10-CM | POA: Diagnosis not present

## 2018-12-22 DIAGNOSIS — R809 Proteinuria, unspecified: Secondary | ICD-10-CM | POA: Diagnosis not present

## 2018-12-22 DIAGNOSIS — N189 Chronic kidney disease, unspecified: Secondary | ICD-10-CM | POA: Diagnosis not present

## 2018-12-22 DIAGNOSIS — N179 Acute kidney failure, unspecified: Secondary | ICD-10-CM | POA: Diagnosis not present

## 2018-12-22 DIAGNOSIS — N183 Chronic kidney disease, stage 3 (moderate): Secondary | ICD-10-CM | POA: Diagnosis not present

## 2018-12-22 DIAGNOSIS — D631 Anemia in chronic kidney disease: Secondary | ICD-10-CM | POA: Diagnosis not present

## 2018-12-22 DIAGNOSIS — N185 Chronic kidney disease, stage 5: Secondary | ICD-10-CM | POA: Diagnosis not present

## 2018-12-23 DIAGNOSIS — N184 Chronic kidney disease, stage 4 (severe): Secondary | ICD-10-CM | POA: Diagnosis not present

## 2018-12-23 DIAGNOSIS — E1151 Type 2 diabetes mellitus with diabetic peripheral angiopathy without gangrene: Secondary | ICD-10-CM | POA: Diagnosis not present

## 2018-12-23 DIAGNOSIS — I129 Hypertensive chronic kidney disease with stage 1 through stage 4 chronic kidney disease, or unspecified chronic kidney disease: Secondary | ICD-10-CM | POA: Diagnosis not present

## 2018-12-23 DIAGNOSIS — E785 Hyperlipidemia, unspecified: Secondary | ICD-10-CM | POA: Diagnosis not present

## 2018-12-23 DIAGNOSIS — Z89512 Acquired absence of left leg below knee: Secondary | ICD-10-CM | POA: Diagnosis not present

## 2018-12-23 DIAGNOSIS — K219 Gastro-esophageal reflux disease without esophagitis: Secondary | ICD-10-CM | POA: Diagnosis not present

## 2018-12-23 DIAGNOSIS — N179 Acute kidney failure, unspecified: Secondary | ICD-10-CM | POA: Diagnosis not present

## 2018-12-23 DIAGNOSIS — Z9119 Patient's noncompliance with other medical treatment and regimen: Secondary | ICD-10-CM | POA: Diagnosis not present

## 2018-12-23 DIAGNOSIS — Z9181 History of falling: Secondary | ICD-10-CM | POA: Diagnosis not present

## 2018-12-23 DIAGNOSIS — Z7984 Long term (current) use of oral hypoglycemic drugs: Secondary | ICD-10-CM | POA: Diagnosis not present

## 2018-12-23 DIAGNOSIS — L89890 Pressure ulcer of other site, unstageable: Secondary | ICD-10-CM | POA: Diagnosis not present

## 2018-12-23 DIAGNOSIS — E1122 Type 2 diabetes mellitus with diabetic chronic kidney disease: Secondary | ICD-10-CM | POA: Diagnosis not present

## 2018-12-23 DIAGNOSIS — E118 Type 2 diabetes mellitus with unspecified complications: Secondary | ICD-10-CM | POA: Diagnosis not present

## 2018-12-26 DIAGNOSIS — N185 Chronic kidney disease, stage 5: Secondary | ICD-10-CM | POA: Diagnosis not present

## 2018-12-26 DIAGNOSIS — N179 Acute kidney failure, unspecified: Secondary | ICD-10-CM | POA: Diagnosis not present

## 2018-12-26 DIAGNOSIS — E118 Type 2 diabetes mellitus with unspecified complications: Secondary | ICD-10-CM | POA: Diagnosis not present

## 2018-12-27 ENCOUNTER — Other Ambulatory Visit (HOSPITAL_COMMUNITY): Payer: Self-pay | Admitting: Nephrology

## 2018-12-27 ENCOUNTER — Other Ambulatory Visit: Payer: Self-pay

## 2018-12-27 DIAGNOSIS — N186 End stage renal disease: Secondary | ICD-10-CM

## 2018-12-27 DIAGNOSIS — N179 Acute kidney failure, unspecified: Secondary | ICD-10-CM

## 2018-12-29 ENCOUNTER — Encounter (HOSPITAL_COMMUNITY): Payer: Self-pay

## 2018-12-29 ENCOUNTER — Encounter: Payer: Self-pay | Admitting: Vascular Surgery

## 2018-12-29 ENCOUNTER — Encounter: Payer: Self-pay | Admitting: Family

## 2018-12-29 ENCOUNTER — Other Ambulatory Visit (HOSPITAL_COMMUNITY): Payer: Self-pay

## 2018-12-29 DIAGNOSIS — L89890 Pressure ulcer of other site, unstageable: Secondary | ICD-10-CM | POA: Diagnosis not present

## 2018-12-29 DIAGNOSIS — Z89512 Acquired absence of left leg below knee: Secondary | ICD-10-CM | POA: Diagnosis not present

## 2018-12-29 DIAGNOSIS — N184 Chronic kidney disease, stage 4 (severe): Secondary | ICD-10-CM | POA: Diagnosis not present

## 2018-12-29 DIAGNOSIS — E1151 Type 2 diabetes mellitus with diabetic peripheral angiopathy without gangrene: Secondary | ICD-10-CM | POA: Diagnosis not present

## 2018-12-29 DIAGNOSIS — Z7984 Long term (current) use of oral hypoglycemic drugs: Secondary | ICD-10-CM | POA: Diagnosis not present

## 2018-12-29 DIAGNOSIS — E785 Hyperlipidemia, unspecified: Secondary | ICD-10-CM | POA: Diagnosis not present

## 2018-12-29 DIAGNOSIS — I129 Hypertensive chronic kidney disease with stage 1 through stage 4 chronic kidney disease, or unspecified chronic kidney disease: Secondary | ICD-10-CM | POA: Diagnosis not present

## 2018-12-29 DIAGNOSIS — Z9181 History of falling: Secondary | ICD-10-CM | POA: Diagnosis not present

## 2018-12-29 DIAGNOSIS — Z9119 Patient's noncompliance with other medical treatment and regimen: Secondary | ICD-10-CM | POA: Diagnosis not present

## 2018-12-29 DIAGNOSIS — E1122 Type 2 diabetes mellitus with diabetic chronic kidney disease: Secondary | ICD-10-CM | POA: Diagnosis not present

## 2018-12-29 DIAGNOSIS — K219 Gastro-esophageal reflux disease without esophagitis: Secondary | ICD-10-CM | POA: Diagnosis not present

## 2018-12-30 ENCOUNTER — Emergency Department (HOSPITAL_COMMUNITY): Payer: Medicare Other

## 2018-12-30 ENCOUNTER — Encounter (HOSPITAL_COMMUNITY): Payer: Self-pay | Admitting: Emergency Medicine

## 2018-12-30 ENCOUNTER — Emergency Department (HOSPITAL_COMMUNITY)
Admission: EM | Admit: 2018-12-30 | Discharge: 2018-12-30 | Disposition: A | Discharge status: Home / Self Care | Payer: Medicare Other | Attending: Emergency Medicine | Admitting: Emergency Medicine

## 2018-12-30 ENCOUNTER — Other Ambulatory Visit: Payer: Self-pay

## 2018-12-30 DIAGNOSIS — N185 Chronic kidney disease, stage 5: Secondary | ICD-10-CM | POA: Diagnosis not present

## 2018-12-30 DIAGNOSIS — R509 Fever, unspecified: Secondary | ICD-10-CM | POA: Diagnosis present

## 2018-12-30 DIAGNOSIS — E119 Type 2 diabetes mellitus without complications: Secondary | ICD-10-CM | POA: Diagnosis not present

## 2018-12-30 DIAGNOSIS — Z79899 Other long term (current) drug therapy: Secondary | ICD-10-CM | POA: Insufficient documentation

## 2018-12-30 DIAGNOSIS — R918 Other nonspecific abnormal finding of lung field: Secondary | ICD-10-CM | POA: Diagnosis not present

## 2018-12-30 DIAGNOSIS — Z20828 Contact with and (suspected) exposure to other viral communicable diseases: Secondary | ICD-10-CM | POA: Insufficient documentation

## 2018-12-30 DIAGNOSIS — J189 Pneumonia, unspecified organism: Secondary | ICD-10-CM | POA: Insufficient documentation

## 2018-12-30 LAB — BASIC METABOLIC PANEL
Anion gap: 12 (ref 5–15)
BUN: 65 mg/dL — ABNORMAL HIGH (ref 6–20)
CO2: 15 mmol/L — ABNORMAL LOW (ref 22–32)
Calcium: 7.5 mg/dL — ABNORMAL LOW (ref 8.9–10.3)
Chloride: 111 mmol/L (ref 98–111)
Creatinine, Ser: 9.32 mg/dL — ABNORMAL HIGH (ref 0.61–1.24)
GFR calc Af Amer: 7 mL/min — ABNORMAL LOW (ref >60)
GFR calc non Af Amer: 6 mL/min — ABNORMAL LOW (ref >60)
Glucose, Bld: 124 mg/dL — ABNORMAL HIGH (ref 70–99)
Potassium: 4.3 mmol/L (ref 3.5–5.1)
Sodium: 138 mmol/L (ref 135–145)

## 2018-12-30 LAB — CBC
HCT: 32.8 % — ABNORMAL LOW (ref 39.0–52.0)
Hemoglobin: 10.4 g/dL — ABNORMAL LOW (ref 13.0–17.0)
MCH: 30.4 pg (ref 26.0–34.0)
MCHC: 31.7 g/dL (ref 30.0–36.0)
MCV: 95.9 fL (ref 80.0–100.0)
Platelets: 260 10*3/uL (ref 150–400)
RBC: 3.42 MIL/uL — ABNORMAL LOW (ref 4.22–5.81)
RDW: 13.5 % (ref 11.5–15.5)
WBC: 9.7 10*3/uL (ref 4.0–10.5)
nRBC: 0 % (ref 0.0–0.2)

## 2018-12-30 LAB — URINALYSIS, ROUTINE W REFLEX MICROSCOPIC
Bilirubin Urine: NEGATIVE
Glucose, UA: 150 mg/dL — AB
Ketones, ur: NEGATIVE mg/dL
Leukocytes,Ua: NEGATIVE
Nitrite: NEGATIVE
Protein, ur: >=300 mg/dL — AB
Specific Gravity, Urine: 1.019 (ref 1.005–1.030)
pH: 6 (ref 5.0–8.0)

## 2018-12-30 LAB — SARS CORONAVIRUS 2 BY RT PCR (HOSPITAL ORDER, PERFORMED IN ~~LOC~~ HOSPITAL LAB): SARS Coronavirus 2: NEGATIVE

## 2018-12-30 IMAGING — DX PORTABLE CHEST - 1 VIEW
1 series · 1 of 1 positions shown · non-contrast
Comparison: [DATE]

CLINICAL DATA: Possible admission

EXAM:
PORTABLE CHEST 1 VIEW

[chest ap]
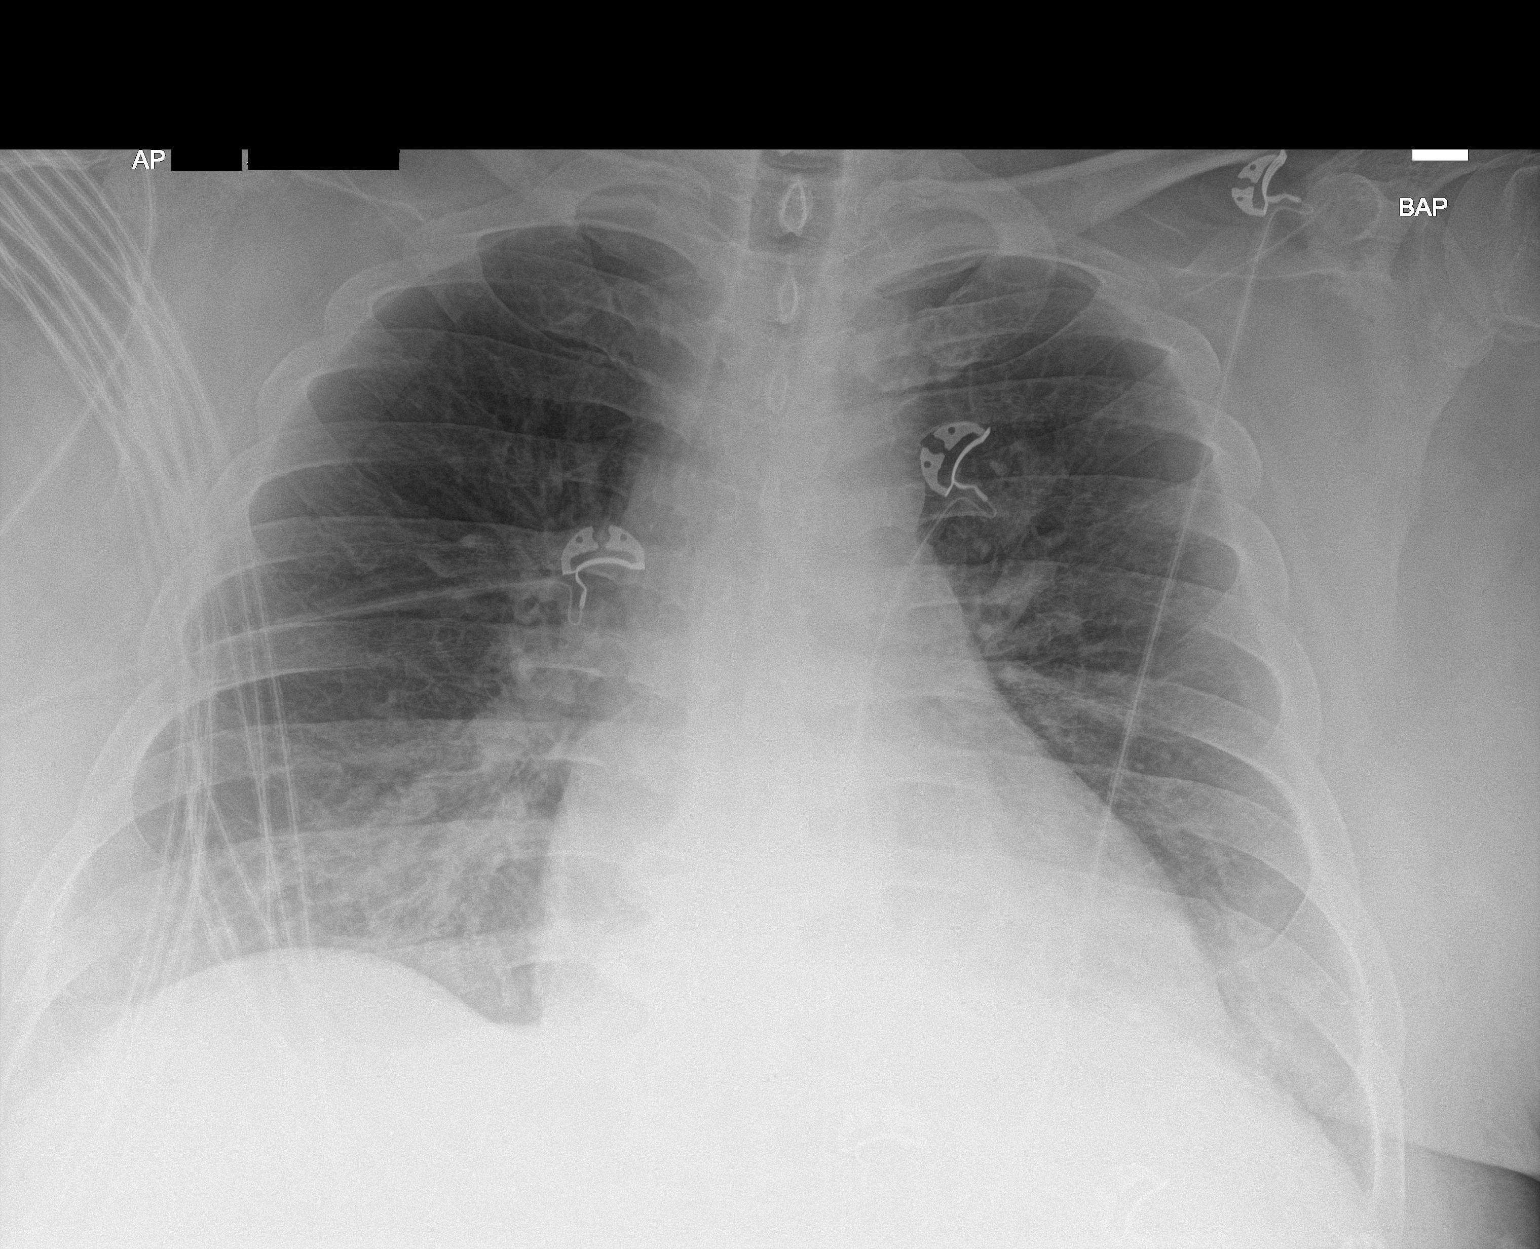

[1 of 1 positions shown; findings below may reference images not displayed]

FINDINGS: Heart is borderline in size. Left basilar airspace opacity could
reflect atelectasis or pneumonia. Right lung clear. No effusions or
acute bony abnormality.
IMPRESSION: Heart upper limits normal in size. Left basilar atelectasis or
infiltrate/pneumonia.

## 2018-12-30 MED ORDER — DOXYCYCLINE HYCLATE 100 MG PO TABS
100.0000 mg | ORAL_TABLET | Freq: Once | ORAL | Status: AC
  Administered 2018-12-30: 100 mg via ORAL
  Filled 2018-12-30: qty 1

## 2018-12-30 MED ORDER — DOXYCYCLINE HYCLATE 100 MG PO CAPS: 100.0000 mg | ORAL_CAPSULE | Freq: Two times a day (BID) | ORAL | 0 refills | Status: DC

## 2018-12-30 NOTE — Discharge Instructions (Signed)
You were seen in the ED today for a fever that you had yesterday; your chest xray showed a possible pneumonia; please take the antibiotic as prescribed for the next 10 days. Please follow up with your PCP regarding this  Your kidney function was 9.32 today but your electrolytes are within normal limits; please follow up with nephrology to start dialysis.

## 2018-12-30 NOTE — ED Notes (Signed)
Handoff report given to Bobby, RN 

## 2018-12-30 NOTE — ED Provider Notes (Signed)
[facility] EMERGENCY DEPARTMENT Provider Note   CSN: [id number] Arrival date & time: 12/30/18  1550    History   Chief Complaint Chief Complaint  Patient presents with  . Fever  . Dialysis set up    HPI Michael Riggs is a 48 y.o. male with PMHx DM who presents to the ED for workup of subjective fever that pt experienced yesterday. Pt was supposed to see nephrologist Dr. Hollie Salk yesterday to begin workup to start dialysis as pt was recently diagnosed with CKD; when he mentioned the fever they did not want to see patient and suggested he be evaluated in the ED first. Pt reports he took Nyquil last night and felt much better this morning. He has no complaints at this time. Denies chills, cough, shortness of breath, URI symptoms, abdominal pain, nausea, vomiting.       History reviewed. No pertinent past medical history.  Patient Active Problem List   Diagnosis Date Noted  . PDR (proliferative diabetic retinopathy) ([place]) 12/14/2013  . Venous hypertension 09/22/2013  . Diabetes mellitus type 2 in obese ([place]) 07/04/2013  . Habitual alcohol use 07/04/2013  . Obesity, Class II, BMI 35-39.9, with comorbidity 07/04/2013    History reviewed. No pertinent surgical history.      Home Medications    Prior to Admission medications   Medication Sig Start Date End Date Taking? Authorizing Provider  atorvastatin (LIPITOR) 40 MG tablet TK 1 T PO D 07/19/18   [provider]  citalopram (CELEXA) 40 MG tablet TK 1 T PO ONCE D 12/13/16   [provider]  doxycycline (VIBRAMYCIN) 100 MG capsule Take 1 capsule (100 mg total) by mouth 2 (two) times daily. 12/30/18   Alroy Bailiff, Jorge Amparo, PA-C  gabapentin (NEURONTIN) 300 MG capsule TK 1 C PO TID 02/15/17   [provider]  glipiZIDE (GLUCOTROL) 10 MG tablet TK 1 T PO BID 12/13/16   [provider]  hydrOXYzine (ATARAX/VISTARIL) 25 MG tablet TK 1 T PO Q 8 H PRA 04/26/18   [provider]   LEVEMIR 100 UNIT/ML injection  03/16/17   [provider]  losartan-hydrochlorothiazide (HYZAAR) 100-12.5 MG tablet TK 1 T PO QD 12/13/16   [provider]  NOVOFINE PLUS 32G X 4 MM Hand  08/17/18   [provider]  omeprazole (PRILOSEC) 40 MG capsule Take 40 mg by mouth daily.    [provider]  ondansetron (ZOFRAN) 4 MG tablet TK 1 T PO D PRN 07/19/18   [provider]  [place] 18 MG/3ML SOPN  08/16/17   [provider]    Family History No family history on file.  Social History Social History   Tobacco Use  . Smoking status: Never Smoker  . Smokeless tobacco: Never Used  Substance Use Topics  . Alcohol use: Not on file  . Drug use: Not on file     Allergies   Patient has no known allergies.   Review of Systems Review of Systems  Constitutional: Positive for fever (subjective; resolved). Negative for chills.  HENT: Negative for congestion and sore throat.   Eyes: Negative for redness.  Respiratory: Negative for cough and shortness of breath.   Cardiovascular: Negative for chest pain.  Gastrointestinal: Negative for abdominal pain, constipation, diarrhea, nausea and vomiting.  Genitourinary: Negative for difficulty urinating.  Musculoskeletal: Negative for myalgias.  Skin: Negative for rash.  Neurological: Negative for headaches.     Physical Exam Updated Vital Signs BP (!) 159/95  Pulse 96   Temp 98.7 F (37.1 C) (Oral)   Resp 18   Ht 5\' 10"  (1.778 m)   Wt 130.2 kg   SpO2 99%   BMI 41.18 kg/m   Physical Exam Vitals signs and nursing note reviewed.  Constitutional:      Appearance: He is not ill-appearing.  HENT:     Head: Normocephalic and atraumatic.     Right Ear: Tympanic membrane normal.     Left Ear: Tympanic membrane normal.     Nose: Nose normal.     Mouth/Throat:     Mouth: Mucous membranes are moist.     Pharynx: No oropharyngeal exudate or posterior oropharyngeal erythema.  Eyes:      General: No scleral icterus.    Conjunctiva/sclera: Conjunctivae normal.  Neck:     Musculoskeletal: Neck supple.  Cardiovascular:     Rate and Rhythm: Normal rate and regular rhythm.     Pulses: Normal pulses.  Pulmonary:     Effort: Pulmonary effort is normal.     Breath sounds: Normal breath sounds. No wheezing, rhonchi or rales.  Abdominal:     Palpations: Abdomen is soft.     Tenderness: There is no abdominal tenderness. There is no guarding or rebound.  Skin:    General: Skin is warm and dry.  Neurological:     Mental Status: He is alert.      ED Treatments / Results  Labs (all labs ordered are listed, but only abnormal results are displayed) Labs Reviewed  URINALYSIS, ROUTINE W REFLEX MICROSCOPIC - Abnormal; Notable for the following components:      Result Value   APPearance HAZY (*)    Glucose, UA 150 (*)    Hgb urine dipstick SMALL (*)    Protein, ur >=300 (*)    Bacteria, UA RARE (*)    All other components within normal limits  BASIC METABOLIC PANEL - Abnormal; Notable for the following components:   CO2 15 (*)    Glucose, Bld 124 (*)    BUN 65 (*)    Creatinine, Ser 9.32 (*)    Calcium 7.5 (*)    GFR calc non Af Amer 6 (*)    GFR calc Af Amer 7 (*)    All other components within normal limits  CBC - Abnormal; Notable for the following components:   RBC 3.42 (*)    Hemoglobin 10.4 (*)    HCT 32.8 (*)    All other components within normal limits  SARS CORONAVIRUS 2 (HOSPITAL ORDER, [place] [facility])    EKG EKG Interpretation  Date/Time:  Friday December 30 2018 19:03:02 EDT Ventricular Rate:  85 PR Interval:    QRS Duration: 96 QT Interval:  408 QTC Calculation: 486 R Axis:   96 Text Interpretation:  Sinus rhythm Atrial premature complex Anteroseptal infarct, acute Baseline wander in lead(s) V1 V2 Partial missing lead(s): V1 V2 poor baseline, will repeat  Confirmed by Wandra Arthurs [phone]) on 12/30/2018 7:09:00 PM   Radiology  Dg Chest Port 1 View  Result Date: 12/30/2018 CLINICAL DATA:  Possible admission EXAM: PORTABLE CHEST 1 VIEW COMPARISON:  03/04/2015 FINDINGS: Heart is borderline in size. Left basilar airspace opacity could reflect atelectasis or pneumonia. Right lung clear. No effusions or acute bony abnormality. IMPRESSION: Heart upper limits normal in size. Left basilar atelectasis or infiltrate/pneumonia. Electronically Signed   By: Rolm Baptise M.D.   On: 12/30/2018 19:38    Procedures Procedures (including  critical care time)  Medications Ordered in ED Medications  doxycycline (VIBRA-TABS) tablet 100 mg (100 mg Oral Given 12/30/18 2101)     Initial Impression / Assessment and Plan / ED Course  I have reviewed the triage vital signs and the nursing notes.  Pertinent labs & imaging results that were available during my care of the patient were reviewed by me and considered in my medical decision making (see chart for details).    Pt is a 48 year old male who presents with complaints of subjective fever that was present yesterday, resolved currently. He is currently being worked up by nephrologist with plans for dialysis very soon; he had an appointment with Dr. Hollie Salk yesterday but when he mentioned the fever he was told to come to the ED for further eval. Pt has no complaints currently.   Baseline bloodwork obtained in triage; creatinine at 9.32 with GFR 6; potassium within normal limits. EKG normal as well. Given complaint of fever will obtain CXR in the ED today and reevaluate; likely patient will be discharged home with close nephro follow up.   CXR with findings of possible pneumonia; given CKD patient is at high risk; will treat with doxycycline outpatient. He is to follow up with PCP for repeat CXR after antibiotics. Also advised to follow up with nephrology; the sooner patient gets started on dialysis the better. Patient is in agreement with plan at this time and stable for discharge home.         Final Clinical Impressions(s) / ED Diagnoses   Final diagnoses:  Community acquired pneumonia of left lower lobe of lung ([place])  Stage 5 chronic kidney disease not on chronic dialysis [facility])    ED Discharge Orders         Ordered    doxycycline (VIBRAMYCIN) 100 MG capsule  2 times daily     12/30/18 2117           Eustaquio Maize, PA-C 12/31/18 0001    Drenda Freeze, MD 12/31/18 1452

## 2018-12-30 NOTE — ED Triage Notes (Signed)
Pt reports he was supposed to go to outpatient set up for dialysis, not a current dialysis patient but is supposed to get set up for it with access. States he was told to come here for work up due to pt reporting feeling like he had a fever and a cough. PT afebrile at triage.

## 2019-01-02 ENCOUNTER — Other Ambulatory Visit: Payer: Self-pay

## 2019-01-02 ENCOUNTER — Ambulatory Visit (HOSPITAL_COMMUNITY): Payer: Medicare Other

## 2019-01-02 DIAGNOSIS — N185 Chronic kidney disease, stage 5: Secondary | ICD-10-CM | POA: Diagnosis not present

## 2019-01-02 DIAGNOSIS — N186 End stage renal disease: Secondary | ICD-10-CM

## 2019-01-02 DIAGNOSIS — N179 Acute kidney failure, unspecified: Secondary | ICD-10-CM | POA: Diagnosis not present

## 2019-01-02 DIAGNOSIS — E118 Type 2 diabetes mellitus with unspecified complications: Secondary | ICD-10-CM | POA: Diagnosis not present

## 2019-01-03 ENCOUNTER — Ambulatory Visit (INDEPENDENT_AMBULATORY_CARE_PROVIDER_SITE_OTHER): Payer: Medicare Other | Admitting: Vascular Surgery

## 2019-01-03 ENCOUNTER — Encounter: Payer: Self-pay | Admitting: *Deleted

## 2019-01-03 ENCOUNTER — Ambulatory Visit (HOSPITAL_COMMUNITY)
Admission: RE | Admit: 2019-01-03 | Discharge: 2019-01-03 | Disposition: A | Discharge status: Home / Self Care | Payer: Medicare Other | Source: Ambulatory Visit | Attending: Vascular Surgery | Admitting: Vascular Surgery

## 2019-01-03 ENCOUNTER — Ambulatory Visit (INDEPENDENT_AMBULATORY_CARE_PROVIDER_SITE_OTHER)
Admission: RE | Admit: 2019-01-03 | Discharge: 2019-01-03 | Disposition: A | Discharge status: Home / Self Care | Payer: Medicare Other | Source: Ambulatory Visit | Attending: Vascular Surgery | Admitting: Vascular Surgery

## 2019-01-03 ENCOUNTER — Other Ambulatory Visit: Payer: Self-pay

## 2019-01-03 ENCOUNTER — Encounter: Payer: Self-pay | Admitting: Vascular Surgery

## 2019-01-03 VITALS — BP 166/100 | HR 86 | Temp 97.4°F | Resp 20 | Ht 70.0 in | Wt 304.0 lb

## 2019-01-03 DIAGNOSIS — N186 End stage renal disease: Secondary | ICD-10-CM | POA: Diagnosis not present

## 2019-01-03 NOTE — H&P (View-Only) (Signed)
[facility] of [place]  Patient name: Michael Riggs MRN: [id number] DOB: 18-Apr-1971 Sex: male  REASON FOR CONSULT: Discuss access for hemodialysis  HPI: Michael Riggs is a 48 y.o. male, who is here today for discussion of hemodialysis access.  He has progressive renal insufficiency and is approaching need for hemodialysis.  He is right-handed.  Never had central access and does not have a pacemaker.  History reviewed. No pertinent past medical history.  History reviewed. No pertinent family history.  SOCIAL HISTORY: Social History   Socioeconomic History  . Marital status: Divorced    Spouse name: Not on file  . Number of children: Not on file  . Years of education: Not on file  . Highest education level: Not on file  Occupational History  . Not on file  Social Needs  . Financial resource strain: Not on file  . Food insecurity    Worry: Not on file    Inability: Not on file  . Transportation needs    Medical: Not on file    Non-medical: Not on file  Tobacco Use  . Smoking status: Never Smoker  . Smokeless tobacco: Never Used  Substance and Sexual Activity  . Alcohol use: Not Currently  . Drug use: Not Currently  . Sexual activity: Not on file  Lifestyle  . Physical activity    Days per week: Not on file    Minutes per session: Not on file  . Stress: Not on file  Relationships  . Social Herbalist on phone: Not on file    Gets together: Not on file    Attends religious service: Not on file    Active member of club or organization: Not on file    Attends meetings of clubs or organizations: Not on file    Relationship status: Not on file  . Intimate partner violence    Fear of current or ex partner: Not on file    Emotionally abused: Not on file    Physically abused: Not on file    Forced sexual activity: Not on file  Other Topics Concern  . Not on file  Social History Narrative   . Not on file    No Known Allergies  Current Outpatient Medications  Medication Sig Dispense Refill  . atorvastatin (LIPITOR) 40 MG tablet TK 1 T PO D    . citalopram (CELEXA) 40 MG tablet TK 1 T PO ONCE D  0  . doxycycline (VIBRAMYCIN) 100 MG capsule Take 1 capsule (100 mg total) by mouth 2 (two) times daily. 20 capsule 0  . gabapentin (NEURONTIN) 300 MG capsule TK 1 C PO TID  2  . hydrOXYzine (ATARAX/VISTARIL) 25 MG tablet TK 1 T PO Q 8 H PRA  0  . losartan-hydrochlorothiazide (HYZAAR) 100-12.5 MG tablet TK 1 T PO QD  2  . NOVOFINE PLUS 32G X 4 MM MISC     . omeprazole (PRILOSEC) 40 MG capsule Take 40 mg by mouth daily.    . ondansetron (ZOFRAN) 4 MG tablet TK 1 T PO D PRN    . glipiZIDE (GLUCOTROL) 10 MG tablet TK 1 T PO BID  2  . LEVEMIR 100 UNIT/ML injection   0  . VICTOZA 18 MG/3ML SOPN   2   No current facility-administered medications for this visit.     REVIEW OF SYSTEMS:  [X]  denotes positive finding, [ ]  denotes negative finding Cardiac  Comments:  Chest  pain or chest pressure: x   Shortness of breath upon exertion: x   Short of breath when lying flat:    Irregular heart rhythm:        Vascular    Pain in calf, thigh, or hip brought on by ambulation: x   Pain in feet at night that wakes you up from your sleep:     Blood clot in your veins:    Leg swelling:  x       Pulmonary    Oxygen at home:    Productive cough:     Wheezing:  x       Neurologic    Sudden weakness in arms or legs:     Sudden numbness in arms or legs:     Sudden onset of difficulty speaking or slurred speech:    Temporary loss of vision in one eye:     Problems with dizziness:         Gastrointestinal    Blood in stool:     Vomited blood:         Genitourinary    Burning when urinating:     Blood in urine:        Psychiatric    Major depression:         Hematologic    Bleeding problems:    Problems with blood clotting too easily:        Skin    Rashes or ulcers:         Constitutional    Fever or chills:      PHYSICAL EXAM: Vitals:   01/03/19 1600  BP: (!) 166/100  Pulse: 86  Resp: 20  Temp: (!) 97.4 F (36.3 C)  SpO2: 98%  Weight: (!) 137.9 kg  Height: 5\' 10"  (1.778 m)    GENERAL: The patient is a well-nourished male, in no acute distress. The vital signs are documented above. CARDIOVASCULAR: Plus radial pulses bilaterally. PULMONARY: There is good air exchange  ABDOMEN: Soft and non-tender  MUSCULOSKELETAL: There are no major deformities or cyanosis.  Left below-knee amputation NEUROLOGIC: No focal weakness or paresthesias are detected. SKIN: There are no ulcers or rashes noted. PSYCHIATRIC: The patient has a normal affect.  DATA:  Moderate size cephalic vein bilaterally.  Large left basilic vein  MEDICAL ISSUES: Discussed options regarding hemodialysis access.  Discussed temporary catheter and AV fistula and AV graft placement.  Would recommend left arm AV fistula since he is right-hand dominant.  We will proceed at his earliest convenience.  Will image his veins at the time of surgery to decide between cephalic vein and basilic vein fistula.  Also discussed the potential need for graft if his veins are inadequate.  We will schedule this at his earliest [place]. Elyana Grabski, MD [facility] V[facility] Specialists of [facility] Tel [phone] Pager [phone]

## 2019-01-03 NOTE — Progress Notes (Signed)
[facility] Specialist of [place]  Patient name: Michael Riggs MRN: [id number] DOB: [birth year] Sex: male  REASON FOR CONSULT: Discuss access for hemodialysis  HPI: Michael Riggs is a 48 y.o. male, who is here today for discussion of hemodialysis access.  He has progressive renal insufficiency and is approaching need for hemodialysis.  He is right-handed.  Never had central access and does not have a pacemaker.  History reviewed. No pertinent past medical history.  History reviewed. No pertinent family history.  SOCIAL HISTORY: Social History   Socioeconomic History  . Marital status: Divorced    Spouse name: Not on file  . Number of children: Not on file  . Years of education: Not on file  . Highest education level: Not on file  Occupational History  . Not on file  Social Needs  . Financial resource strain: Not on file  . Food insecurity    Worry: Not on file    Inability: Not on file  . Transportation needs    Medical: Not on file    Non-medical: Not on file  Tobacco Use  . Smoking status: Never Smoker  . Smokeless tobacco: Never Used  Substance and Sexual Activity  . Alcohol use: Not Currently  . Drug use: Not Currently  . Sexual activity: Not on file  Lifestyle  . Physical activity    Days per week: Not on file    Minutes per session: Not on file  . Stress: Not on file  Relationships  . Social Herbalist on phone: Not on file    Gets together: Not on file    Attends religious service: Not on file    Active member of club or organization: Not on file    Attends meetings of clubs or organizations: Not on file    Relationship status: Not on file  . Intimate partner violence    Fear of current or ex partner: Not on file    Emotionally abused: Not on file    Physically abused: Not on file    Forced sexual activity: Not on file  Other Topics Concern  . Not on file  Social History Narrative   . Not on file    No Known Allergies  Current Outpatient Medications  Medication Sig Dispense Refill  . atorvastatin (LIPITOR) 40 MG tablet TK 1 T PO D    . citalopram (CELEXA) 40 MG tablet TK 1 T PO ONCE D  0  . doxycycline (VIBRAMYCIN) 100 MG capsule Take 1 capsule (100 mg total) by mouth 2 (two) times daily. 20 capsule 0  . gabapentin (NEURONTIN) 300 MG capsule TK 1 C PO TID  2  . hydrOXYzine (ATARAX/VISTARIL) 25 MG tablet TK 1 T PO Q 8 H PRA  0  . losartan-hydrochlorothiazide (HYZAAR) 100-12.5 MG tablet TK 1 T PO QD  2  . NOVOFINE PLUS 32G X 4 MM MISC     . omeprazole (PRILOSEC) 40 MG capsule Take 40 mg by mouth daily.    . ondansetron (ZOFRAN) 4 MG tablet TK 1 T PO D PRN    . glipiZIDE (GLUCOTROL) 10 MG tablet TK 1 T PO BID  2  . LEVEMIR 100 UNIT/ML injection   0  . VICTOZA 18 MG/3ML SOPN   2   No current facility-administered medications for this visit.     REVIEW OF SYSTEMS:  [X]  denotes positive finding, [ ]  denotes negative finding Cardiac  Comments:  Chest  pain or chest pressure: x   Shortness of breath upon exertion: x   Short of breath when lying flat:    Irregular heart rhythm:        Vascular    Pain in calf, thigh, or hip brought on by ambulation: x   Pain in feet at night that wakes you up from your sleep:     Blood clot in your veins:    Leg swelling:  x       Pulmonary    Oxygen at home:    Productive cough:     Wheezing:  x       Neurologic    Sudden weakness in arms or legs:     Sudden numbness in arms or legs:     Sudden onset of difficulty speaking or slurred speech:    Temporary loss of vision in one eye:     Problems with dizziness:         Gastrointestinal    Blood in stool:     Vomited blood:         Genitourinary    Burning when urinating:     Blood in urine:        Psychiatric    Major depression:         Hematologic    Bleeding problems:    Problems with blood clotting too easily:        Skin    Rashes or ulcers:         Constitutional    Fever or chills:      PHYSICAL EXAM: Vitals:   01/03/19 1600  BP: (!) 166/100  Pulse: 86  Resp: 20  Temp: (!) 97.4 F (36.3 C)  SpO2: 98%  Weight: (!) 137.9 kg  Height: 5\' 10"  (1.778 m)    GENERAL: The patient is a well-nourished male, in no acute distress. The vital signs are documented above. CARDIOVASCULAR: Plus radial pulses bilaterally. PULMONARY: There is good air exchange  ABDOMEN: Soft and non-tender  MUSCULOSKELETAL: There are no major deformities or cyanosis.  Left below-knee amputation NEUROLOGIC: No focal weakness or paresthesias are detected. SKIN: There are no ulcers or rashes noted. PSYCHIATRIC: The patient has a normal affect.  DATA:  Moderate size cephalic vein bilaterally.  Large left basilic vein  MEDICAL ISSUES: Discussed options regarding hemodialysis access.  Discussed temporary catheter and AV fistula and AV graft placement.  Would recommend left arm AV fistula since he is right-hand dominant.  We will proceed at his earliest convenience.  Will image his veins at the time of surgery to decide between cephalic vein and basilic vein fistula.  Also discussed the potential need for graft if his veins are inadequate.  We will schedule this at his earliest [place]. Seretha Estabrooks, MD [facility] Vascular and Vein Specialists of [facility] Tel [phone] Pager [phone]

## 2019-01-04 ENCOUNTER — Telehealth: Payer: Self-pay | Admitting: *Deleted

## 2019-01-04 ENCOUNTER — Other Ambulatory Visit: Payer: Self-pay | Admitting: *Deleted

## 2019-01-04 ENCOUNTER — Ambulatory Visit (HOSPITAL_COMMUNITY): Payer: Self-pay

## 2019-01-04 NOTE — Telephone Encounter (Signed)
Voice mail message left with time and doctor change for 01/09/2019 surgery. Instructed to be at [facility] admitting at 7:30 am and surgeon will be Dr. Carlis Abbott. Asked patient to call back to confirm.

## 2019-01-05 ENCOUNTER — Ambulatory Visit (HOSPITAL_COMMUNITY): Payer: Medicaid Other

## 2019-01-05 ENCOUNTER — Encounter (HOSPITAL_COMMUNITY): Payer: Self-pay

## 2019-01-05 DIAGNOSIS — E785 Hyperlipidemia, unspecified: Secondary | ICD-10-CM | POA: Diagnosis not present

## 2019-01-05 DIAGNOSIS — I129 Hypertensive chronic kidney disease with stage 1 through stage 4 chronic kidney disease, or unspecified chronic kidney disease: Secondary | ICD-10-CM | POA: Diagnosis not present

## 2019-01-05 DIAGNOSIS — Z7984 Long term (current) use of oral hypoglycemic drugs: Secondary | ICD-10-CM | POA: Diagnosis not present

## 2019-01-05 DIAGNOSIS — N184 Chronic kidney disease, stage 4 (severe): Secondary | ICD-10-CM | POA: Diagnosis not present

## 2019-01-05 DIAGNOSIS — Z9181 History of falling: Secondary | ICD-10-CM | POA: Diagnosis not present

## 2019-01-05 DIAGNOSIS — Z89512 Acquired absence of left leg below knee: Secondary | ICD-10-CM | POA: Diagnosis not present

## 2019-01-05 DIAGNOSIS — L89890 Pressure ulcer of other site, unstageable: Secondary | ICD-10-CM | POA: Diagnosis not present

## 2019-01-05 DIAGNOSIS — Z9119 Patient's noncompliance with other medical treatment and regimen: Secondary | ICD-10-CM | POA: Diagnosis not present

## 2019-01-05 DIAGNOSIS — K219 Gastro-esophageal reflux disease without esophagitis: Secondary | ICD-10-CM | POA: Diagnosis not present

## 2019-01-05 DIAGNOSIS — E1122 Type 2 diabetes mellitus with diabetic chronic kidney disease: Secondary | ICD-10-CM | POA: Diagnosis not present

## 2019-01-05 DIAGNOSIS — E1151 Type 2 diabetes mellitus with diabetic peripheral angiopathy without gangrene: Secondary | ICD-10-CM | POA: Diagnosis not present

## 2019-01-06 ENCOUNTER — Other Ambulatory Visit: Payer: Self-pay

## 2019-01-06 ENCOUNTER — Encounter (HOSPITAL_COMMUNITY): Payer: Self-pay | Admitting: *Deleted

## 2019-01-06 MED ORDER — DEXTROSE 5 % IV SOLN
3.0000 g | INTRAVENOUS | Status: AC
  Administered 2019-01-09: 3 g via INTRAVENOUS
  Filled 2019-01-06: qty 3

## 2019-01-06 NOTE — Progress Notes (Signed)
Spoke with pt for pre-op call. Pt denies cardiac history. Pt is treated for HTN and is a type 2 Diabetic. Pt doesn't know what or when he had an A1C done. Pt states he is no longer taking his Tresiba insulin or the Glipizide because his blood sugar was dropping to the 30's. Instructed pt to check his blood sugar Monday AM when he gets up and every 2 hours until he leaves for the hospital. If blood sugar is 70 or below, treat with 1/2 cup of clear juice (apple or cranberry) and recheck blood sugar 15 minutes after drinking juice. If blood sugar continues to be 70 or below, call the Short Stay department and ask to speak to a nurse. Pt voiced understanding. Pt states he had a Covid test on 12/30/18 and it was negative. I informed him that he would be getting another one day of surgery.    Coronavirus Screening  Have you experienced the following symptoms:  Cough NO Fever (>100.27F) NO Runny nose NO Sore throat NO Difficulty breathing/shortness of breath  NO  Have you or a family member traveled in the last 14 days and where? NO   Patient reminded that hospital visitation restrictions are in effect and the importance of the restrictions.

## 2019-01-06 NOTE — Progress Notes (Signed)
   01/06/19 1825  OBSTRUCTIVE SLEEP APNEA  Have you ever been diagnosed with sleep apnea through a sleep study? No  Do you snore loudly (loud enough to be heard through closed doors)?  1  Do you often feel tired, fatigued, or sleepy during the daytime (such as falling asleep during driving or talking to someone)? 1  Has anyone observed you stop breathing during your sleep? 0  Do you have, or are you being treated for high blood pressure? 1  BMI more than 35 kg/m2? 1  Age > 46 (1-yes) 0  Male Gender (Yes=1) 1  Obstructive Sleep Apnea Score 5

## 2019-01-09 ENCOUNTER — Encounter (HOSPITAL_COMMUNITY): Payer: Self-pay | Admitting: *Deleted

## 2019-01-09 ENCOUNTER — Other Ambulatory Visit: Payer: Self-pay

## 2019-01-09 ENCOUNTER — Ambulatory Visit (HOSPITAL_COMMUNITY)
Admission: RE | Admit: 2019-01-09 | Discharge: 2019-01-09 | Disposition: A | Discharge status: Home / Self Care | Payer: Medicare Other | Attending: Vascular Surgery | Admitting: Vascular Surgery

## 2019-01-09 ENCOUNTER — Ambulatory Visit (HOSPITAL_COMMUNITY): Payer: Medicare Other | Admitting: Registered Nurse

## 2019-01-09 ENCOUNTER — Encounter (HOSPITAL_COMMUNITY)
Admission: RE | Disposition: A | Discharge status: Home / Self Care | Payer: Self-pay | Source: Home / Self Care | Attending: Vascular Surgery

## 2019-01-09 DIAGNOSIS — I129 Hypertensive chronic kidney disease with stage 1 through stage 4 chronic kidney disease, or unspecified chronic kidney disease: Secondary | ICD-10-CM | POA: Insufficient documentation

## 2019-01-09 DIAGNOSIS — D631 Anemia in chronic kidney disease: Secondary | ICD-10-CM | POA: Diagnosis not present

## 2019-01-09 DIAGNOSIS — Z79899 Other long term (current) drug therapy: Secondary | ICD-10-CM | POA: Insufficient documentation

## 2019-01-09 DIAGNOSIS — R062 Wheezing: Secondary | ICD-10-CM | POA: Diagnosis not present

## 2019-01-09 DIAGNOSIS — F1910 Other psychoactive substance abuse, uncomplicated: Secondary | ICD-10-CM | POA: Insufficient documentation

## 2019-01-09 DIAGNOSIS — M7989 Other specified soft tissue disorders: Secondary | ICD-10-CM | POA: Insufficient documentation

## 2019-01-09 DIAGNOSIS — Z794 Long term (current) use of insulin: Secondary | ICD-10-CM | POA: Insufficient documentation

## 2019-01-09 DIAGNOSIS — E1122 Type 2 diabetes mellitus with diabetic chronic kidney disease: Secondary | ICD-10-CM | POA: Insufficient documentation

## 2019-01-09 DIAGNOSIS — Z6841 Body Mass Index (BMI) 40.0 and over, adult: Secondary | ICD-10-CM | POA: Diagnosis not present

## 2019-01-09 DIAGNOSIS — Z87891 Personal history of nicotine dependence: Secondary | ICD-10-CM | POA: Insufficient documentation

## 2019-01-09 DIAGNOSIS — N185 Chronic kidney disease, stage 5: Secondary | ICD-10-CM | POA: Diagnosis not present

## 2019-01-09 DIAGNOSIS — N189 Chronic kidney disease, unspecified: Secondary | ICD-10-CM | POA: Diagnosis not present

## 2019-01-09 DIAGNOSIS — D649 Anemia, unspecified: Secondary | ICD-10-CM | POA: Diagnosis not present

## 2019-01-09 DIAGNOSIS — Z1159 Encounter for screening for other viral diseases: Secondary | ICD-10-CM | POA: Insufficient documentation

## 2019-01-09 DIAGNOSIS — N186 End stage renal disease: Secondary | ICD-10-CM | POA: Diagnosis not present

## 2019-01-09 DIAGNOSIS — I120 Hypertensive chronic kidney disease with stage 5 chronic kidney disease or end stage renal disease: Secondary | ICD-10-CM | POA: Diagnosis not present

## 2019-01-09 HISTORY — DX: Essential (primary) hypertension: I10

## 2019-01-09 HISTORY — DX: Dyspnea, unspecified: R06.00

## 2019-01-09 HISTORY — DX: Depression, unspecified: F32.A

## 2019-01-09 HISTORY — DX: Anemia, unspecified: D64.9

## 2019-01-09 HISTORY — DX: Gastro-esophageal reflux disease without esophagitis: K21.9

## 2019-01-09 HISTORY — PX: AV FISTULA PLACEMENT: SHX1204

## 2019-01-09 LAB — GLUCOSE, CAPILLARY
Glucose-Capillary: 70 mg/dL (ref 70–99)
Glucose-Capillary: 74 mg/dL (ref 70–99)
Glucose-Capillary: 72 mg/dL (ref 70–99)
Glucose-Capillary: 89 mg/dL (ref 70–99)

## 2019-01-09 LAB — POCT I-STAT 4, (NA,K, GLUC, HGB,HCT)
Glucose, Bld: 76 mg/dL (ref 70–99)
HCT: 29 % — ABNORMAL LOW (ref 39.0–52.0)
Hemoglobin: 9.9 g/dL — ABNORMAL LOW (ref 13.0–17.0)
Potassium: 4.4 mmol/L (ref 3.5–5.1)
Sodium: 140 mmol/L (ref 135–145)

## 2019-01-09 LAB — SARS CORONAVIRUS 2 BY RT PCR (HOSPITAL ORDER, PERFORMED IN ~~LOC~~ HOSPITAL LAB): SARS Coronavirus 2: NEGATIVE

## 2019-01-09 SURGERY — ARTERIOVENOUS (AV) FISTULA CREATION
Anesthesia: Monitor Anesthesia Care | Site: Arm Upper | Laterality: Left

## 2019-01-09 MED ORDER — CHLORHEXIDINE GLUCONATE 4 % EX LIQD: 60.0000 mL | Freq: Once | CUTANEOUS | Status: DC

## 2019-01-09 MED ORDER — LIDOCAINE-EPINEPHRINE 0.5 %-1:200000 IJ SOLN
INTRAMUSCULAR | Status: DC | PRN
  Administered 2019-01-09: 50 mL

## 2019-01-09 MED ORDER — PROTAMINE SULFATE 10 MG/ML IV SOLN
INTRAVENOUS | Status: AC
  Filled 2019-01-09: qty 5

## 2019-01-09 MED ORDER — SODIUM CHLORIDE 0.9 % IV SOLN
INTRAVENOUS | Status: DC | PRN
  Administered 2019-01-09: 500 mL

## 2019-01-09 MED ORDER — PROPOFOL 500 MG/50ML IV EMUL
INTRAVENOUS | Status: DC | PRN
  Administered 2019-01-09: 25 ug/kg/min via INTRAVENOUS

## 2019-01-09 MED ORDER — SODIUM CHLORIDE 0.9 % IV SOLN
INTRAVENOUS | Status: DC
  Administered 2019-01-09: 12:00:00 via INTRAVENOUS

## 2019-01-09 MED ORDER — DEXTROSE 50 % IV SOLN
INTRAVENOUS | Status: DC | PRN
  Administered 2019-01-09: 25 mL via INTRAVENOUS

## 2019-01-09 MED ORDER — SUGAMMADEX SODIUM 500 MG/5ML IV SOLN
INTRAVENOUS | Status: AC
  Filled 2019-01-09: qty 5

## 2019-01-09 MED ORDER — LIDOCAINE 2% (20 MG/ML) 5 ML SYRINGE
INTRAMUSCULAR | Status: AC
  Filled 2019-01-09: qty 5

## 2019-01-09 MED ORDER — FENTANYL CITRATE (PF) 100 MCG/2ML IJ SOLN: 25.0000 ug | INTRAMUSCULAR | Status: DC | PRN

## 2019-01-09 MED ORDER — PROPOFOL 10 MG/ML IV BOLUS
INTRAVENOUS | Status: AC
  Filled 2019-01-09: qty 20

## 2019-01-09 MED ORDER — PROMETHAZINE HCL 25 MG/ML IJ SOLN: 6.2500 mg | INTRAMUSCULAR | Status: DC | PRN

## 2019-01-09 MED ORDER — SUCCINYLCHOLINE CHLORIDE 200 MG/10ML IV SOSY
PREFILLED_SYRINGE | INTRAVENOUS | Status: AC
  Filled 2019-01-09: qty 10

## 2019-01-09 MED ORDER — HEPARIN SODIUM (PORCINE) 1000 UNIT/ML IJ SOLN
INTRAMUSCULAR | Status: AC
  Filled 2019-01-09: qty 1

## 2019-01-09 MED ORDER — MIDAZOLAM HCL 2 MG/2ML IJ SOLN
INTRAMUSCULAR | Status: AC
  Filled 2019-01-09: qty 2

## 2019-01-09 MED ORDER — DEXTROSE 50 % IV SOLN
INTRAVENOUS | Status: AC
  Filled 2019-01-09: qty 50

## 2019-01-09 MED ORDER — FENTANYL CITRATE (PF) 250 MCG/5ML IJ SOLN
INTRAMUSCULAR | Status: AC
  Filled 2019-01-09: qty 5

## 2019-01-09 MED ORDER — OXYCODONE-ACETAMINOPHEN 5-325 MG PO TABS
1.0000 | ORAL_TABLET | ORAL | Status: DC | PRN
  Administered 2019-01-09: 1 via ORAL

## 2019-01-09 MED ORDER — ONDANSETRON HCL 4 MG/2ML IJ SOLN
INTRAMUSCULAR | Status: AC
  Filled 2019-01-09: qty 2

## 2019-01-09 MED ORDER — 0.9 % SODIUM CHLORIDE (POUR BTL) OPTIME
TOPICAL | Status: DC | PRN
  Administered 2019-01-09: 1000 mL

## 2019-01-09 MED ORDER — LIDOCAINE-EPINEPHRINE 0.5 %-1:200000 IJ SOLN
INTRAMUSCULAR | Status: AC
  Filled 2019-01-09: qty 1

## 2019-01-09 MED ORDER — MIDAZOLAM HCL 2 MG/2ML IJ SOLN
INTRAMUSCULAR | Status: AC
  Administered 2019-01-09: 1 mg via INTRAVENOUS
  Filled 2019-01-09: qty 2

## 2019-01-09 MED ORDER — OXYCODONE-ACETAMINOPHEN 5-325 MG PO TABS: 1.0000 | ORAL_TABLET | Freq: Four times a day (QID) | ORAL | 0 refills | Status: DC | PRN

## 2019-01-09 MED ORDER — FENTANYL CITRATE (PF) 100 MCG/2ML IJ SOLN
INTRAMUSCULAR | Status: AC
  Administered 2019-01-09: 50 ug via INTRAVENOUS
  Filled 2019-01-09: qty 2

## 2019-01-09 MED ORDER — DEXAMETHASONE SODIUM PHOSPHATE 10 MG/ML IJ SOLN
INTRAMUSCULAR | Status: AC
  Filled 2019-01-09: qty 1

## 2019-01-09 MED ORDER — SODIUM CHLORIDE 0.9 % IV SOLN
INTRAVENOUS | Status: DC
  Administered 2019-01-09: 10:00:00 via INTRAVENOUS

## 2019-01-09 MED ORDER — ONDANSETRON HCL 4 MG/2ML IJ SOLN
INTRAMUSCULAR | Status: DC | PRN
  Administered 2019-01-09: 4 mg via INTRAVENOUS

## 2019-01-09 MED ORDER — MIDAZOLAM HCL 2 MG/2ML IJ SOLN
1.0000 mg | Freq: Once | INTRAMUSCULAR | Status: AC
  Administered 2019-01-09: 1 mg via INTRAVENOUS

## 2019-01-09 MED ORDER — SODIUM CHLORIDE 0.9 % IV SOLN
INTRAVENOUS | Status: AC
  Filled 2019-01-09: qty 1.2

## 2019-01-09 MED ORDER — PHENYLEPHRINE 40 MCG/ML (10ML) SYRINGE FOR IV PUSH (FOR BLOOD PRESSURE SUPPORT)
PREFILLED_SYRINGE | INTRAVENOUS | Status: AC
  Filled 2019-01-09: qty 10

## 2019-01-09 MED ORDER — EPHEDRINE 5 MG/ML INJ
INTRAVENOUS | Status: AC
  Filled 2019-01-09: qty 10

## 2019-01-09 MED ORDER — ROCURONIUM BROMIDE 10 MG/ML (PF) SYRINGE
PREFILLED_SYRINGE | INTRAVENOUS | Status: AC
  Filled 2019-01-09: qty 10

## 2019-01-09 MED ORDER — OXYCODONE-ACETAMINOPHEN 5-325 MG PO TABS
ORAL_TABLET | ORAL | Status: AC
  Filled 2019-01-09: qty 1

## 2019-01-09 MED ORDER — FENTANYL CITRATE (PF) 100 MCG/2ML IJ SOLN
50.0000 ug | Freq: Once | INTRAMUSCULAR | Status: AC
  Administered 2019-01-09: 50 ug via INTRAVENOUS

## 2019-01-09 SURGICAL SUPPLY — 36 items
ARMBAND PINK RESTRICT EXTREMIT (MISCELLANEOUS) ×4 IMPLANT
CANISTER SUCT 3000ML PPV (MISCELLANEOUS) ×2 IMPLANT
CANNULA VESSEL 3MM 2 BLNT TIP (CANNULA) ×2 IMPLANT
CLIP LIGATING EXTRA MED SLVR (CLIP) ×2 IMPLANT
CLIP LIGATING EXTRA SM BLUE (MISCELLANEOUS) ×2 IMPLANT
COVER PROBE W GEL 5X96 (DRAPES) ×2 IMPLANT
COVER WAND RF STERILE (DRAPES) ×2 IMPLANT
DECANTER SPIKE VIAL GLASS SM (MISCELLANEOUS) ×2 IMPLANT
DERMABOND ADVANCED (GAUZE/BANDAGES/DRESSINGS) ×1
DERMABOND ADVANCED .7 DNX12 (GAUZE/BANDAGES/DRESSINGS) ×1 IMPLANT
ELECT REM PT RETURN 9FT ADLT (ELECTROSURGICAL) ×2
ELECTRODE REM PT RTRN 9FT ADLT (ELECTROSURGICAL) ×1 IMPLANT
GLOVE BIO SURGEON STRL SZ 6.5 (GLOVE) ×1 IMPLANT
GLOVE BIO SURGEON STRL SZ7 (GLOVE) ×1 IMPLANT
GLOVE BIOGEL PI IND STRL 6.5 (GLOVE) IMPLANT
GLOVE BIOGEL PI IND STRL 7.0 (GLOVE) IMPLANT
GLOVE BIOGEL PI IND STRL 8 (GLOVE) IMPLANT
GLOVE BIOGEL PI INDICATOR 6.5 (GLOVE) ×2
GLOVE BIOGEL PI INDICATOR 7.0 (GLOVE) ×1
GLOVE BIOGEL PI INDICATOR 8 (GLOVE) ×1
GLOVE ECLIPSE 6.5 STRL STRAW (GLOVE) ×1 IMPLANT
GLOVE SS BIOGEL STRL SZ 7.5 (GLOVE) ×1 IMPLANT
GLOVE SUPERSENSE BIOGEL SZ 7.5 (GLOVE) ×1
GOWN STRL REUS W/ TWL LRG LVL3 (GOWN DISPOSABLE) ×3 IMPLANT
GOWN STRL REUS W/TWL LRG LVL3 (GOWN DISPOSABLE) ×3
KIT BASIN OR (CUSTOM PROCEDURE TRAY) ×2 IMPLANT
KIT TURNOVER KIT B (KITS) ×2 IMPLANT
NS IRRIG 1000ML POUR BTL (IV SOLUTION) ×2 IMPLANT
PACK CV ACCESS (CUSTOM PROCEDURE TRAY) ×2 IMPLANT
PAD ARMBOARD 7.5X6 YLW CONV (MISCELLANEOUS) ×4 IMPLANT
SUT PROLENE 6 0 CC (SUTURE) ×2 IMPLANT
SUT VIC AB 3-0 SH 27 (SUTURE) ×1
SUT VIC AB 3-0 SH 27X BRD (SUTURE) ×1 IMPLANT
TOWEL GREEN STERILE (TOWEL DISPOSABLE) ×2 IMPLANT
UNDERPAD 30X30 (UNDERPADS AND DIAPERS) ×2 IMPLANT
WATER STERILE IRR 1000ML POUR (IV SOLUTION) ×2 IMPLANT

## 2019-01-09 NOTE — Interval H&P Note (Signed)
History and Physical Interval Note:  01/09/2019 11:49 AM  Michael Riggs  has presented today for surgery, with the diagnosis of CHRONIC KIDNEY DISEASE FOR HEMODIALYSIS ACCESS.  The various methods of treatment have been discussed with the patient and family. After consideration of risks, benefits and other options for treatment, the patient has consented to  Procedure(s): ARTERIOVENOUS (AV) FISTULA CREATION VERSUS INSERTION OF ARTERIOVENOUS GRAFT LEFT ARM (Left) as a surgical intervention.  The patient's history has been reviewed, patient examined, no change in status, stable for surgery.  I have reviewed the patient's chart and labs.  Questions were answered to the patient's satisfaction.     [name]

## 2019-01-09 NOTE — Op Note (Signed)
    OPERATIVE REPORT  DATE OF SURGERY: 01/09/2019  PATIENT: Michael Riggs, 48 y.o. male MRN: [id number]  DOB: [birth year]  PRE-OPERATIVE DIAGNOSIS: Chronic renal insufficiency  POST-OPERATIVE DIAGNOSIS:  Same  PROCEDURE: First stage brachiobasilic fistula left arm  SURGEON:  [name], M.D.  PHYSICIAN ASSISTANT: Nurse  ANESTHESIA: Axillary block and local anesthesia  EBL: per anesthesia record  Total I/O In: 200 [I.V.:200] Out: -   BLOOD ADMINISTERED: none  DRAINS: none  SPECIMEN: none  COUNTS CORRECT:  YES  PATIENT DISPOSITION:  PACU - hemodynamically stable  PROCEDURE DETAILS: Patient was taken the operating placed to position where the area of the left arm left axilla were prepped and draped in usual sterile fashion.  SonoSite ultrasound was used to visualize the veins.  The cephalic vein was small the patient did have a good caliber basilic vein.  The vein branches just above the antecubital space and the branch coursing towards the antecubital area was chosen.  Incision was made transversely over the antecubital space and the basilic vein branch was isolated.  Tributary branches were ligated with 3-0 and 4-0 silk ties and divided.  The brachial artery was exposed through the same incision.  The artery was of good caliber did have atherosclerotic change.  The basilic vein was dissected further proximally to the branch above the antecubital space and the other branch that was not used was clipped with a Hemoclip.  The basilic vein was ligated distally and was mobilized to the level of the brachial artery.  The brachial artery was occluded proximally distally with fistula clamps and was opened with an 11 blade and sent longitudinally with Potts scissors.  The vein was brought into approximation with the artery and was spatulated and sewn end-to-side to the artery with a running 6-0 Prolene suture.  Clamps were removed and excellent thrill was noted.  The wounds were  irrigated with saline.  Hemostasis with cautery.  Wounds were closed with 3-0 Vicryl in the subcutaneous and subcuticular tissue.  Sterile dressing was applied and the patient was transferred to the recovery room in stable condition   [name], M.D., [facility] 01/09/2019 1:34 PM

## 2019-01-09 NOTE — Anesthesia Procedure Notes (Signed)
Anesthesia Procedure Image    

## 2019-01-09 NOTE — Discharge Instructions (Signed)
° °  [facility]  Discharge Instructions  AV Fistula or Graft Surgery for Dialysis Access  Please refer to the following instructions for your post-procedure care. Your surgeon or physician assistant will discuss any changes with you.  Activity  You may drive the day following your surgery, if you are comfortable and no longer taking prescription pain medication. Resume full activity as the soreness in your incision resolves.  Bathing/Showering  You may shower after you go home. Keep your incision dry for 48 hours. Do not soak in a bathtub, hot tub, or swim until the incision heals completely. You may not shower if you have a hemodialysis catheter.  Incision Care  Clean your incision with mild soap and water after 48 hours. Pat the area dry with a clean towel. You do not need a bandage unless otherwise instructed. Do not apply any ointments or creams to your incision. You may have skin glue on your incision. Do not peel it off. It will come off on its own in about one week. Your arm may swell a bit after surgery. To reduce swelling use pillows to elevate your arm so it is above your heart. Your doctor will tell you if you need to lightly wrap your arm with an ACE bandage.  Diet  Resume your normal diet. There are not special food restrictions following this procedure. In order to heal from your surgery, it is CRITICAL to get adequate nutrition. Your body requires vitamins, minerals, and protein. Vegetables are the best source of vitamins and minerals. Vegetables also provide the perfect balance of protein. Processed food has little nutritional value, so try to avoid this.  Medications  Resume taking all of your medications. If your incision is causing pain, you may take over-the counter pain relievers such as acetaminophen (Tylenol). If you were prescribed a stronger pain medication, please be aware these medications can cause nausea and constipation. Prevent  nausea by taking the medication with a snack or meal. Avoid constipation by drinking plenty of fluids and eating foods with high amount of fiber, such as fruits, vegetables, and grains.  Do not take Tylenol if you are taking prescription pain medications.  Follow up Your surgeon may want to see you in the office following your access surgery. If so, this will be arranged at the time of your surgery.  Please call us immediately for any of the following conditions:  Increased pain, redness, drainage (pus) from your incision site Fever of 101 degrees or higher Severe or worsening pain at your incision site Hand pain or numbness.  Reduce your risk of vascular disease:  Stop smoking. If you would like help, call [facility] at 1-800-QUIT-NOW (307)571-8988) or [place] [facility] [place] your cholesterol Maintain a desired weight Control your diabetes Keep your blood pressure down  Dialysis  It will take several weeks to several months for your new dialysis access to be ready for use. Your surgeon will determine when it is okay to use it. Your nephrologist will continue to direct your dialysis. You can continue to use your Permcath until your new access is ready for use.   01/09/2019 Sanjith Siwek [id number] [birth year]  Surgeon(s): Early, Arvilla Meres, MD  Procedure(s): Creation of left 1st stage basilic vein transposition  x Do not stick fistula for 12 weeks    If you have any questions, please call the office at [phone].

## 2019-01-09 NOTE — Transfer of Care (Signed)
Immediate Anesthesia Transfer of Care Note  Patient: Khair Chasteen  Procedure(s) Performed: ARTERIOVENOUS (AV) FISTULA CREATION LEFT UPPER ARM (Left Arm Upper)  Patient Location: PACU  Anesthesia Type:MAC and Regional  Level of Consciousness: drowsy and patient cooperative  Airway & Oxygen Therapy: Patient Spontanous Breathing and Patient connected to face mask oxygen  Post-op Assessment: Report given to RN and Post -op Vital signs reviewed and stable  Post vital signs: Reviewed and stable  Last Vitals:  Vitals Value Taken Time  BP 128/83   Temp    Pulse 88   Resp 16   SpO2 95     Last Pain:  Vitals:   01/09/19 0946  TempSrc:   PainSc: 0-No pain      Patients Stated Pain Goal: 0 (00/37/04 8889)  Complications: No apparent anesthesia complications

## 2019-01-09 NOTE — Anesthesia Procedure Notes (Signed)
Anesthesia Regional Block: Supraclavicular block   Pre-Anesthetic Checklist: ,, timeout performed, Correct Patient, Correct Site, Correct Laterality, Correct Procedure, Correct Position, site marked, Risks and benefits discussed,  Surgical consent,  Pre-op evaluation,  At surgeon's request and post-op pain management  Laterality: Left  Prep: chloraprep       Needles:  Injection technique: Single-shot  Needle Type: Echogenic Needle     Needle Length: 9cm      Additional Needles:   Procedures:,,,, ultrasound used (permanent image in chart),,,,  Narrative:  Start time: 01/09/2019 11:50 AM End time: 01/09/2019 12:03 PM Injection made incrementally with aspirations every 5 mL.  Performed by: Personally  Anesthesiologist: Myrtie Soman, MD  Additional Notes: Patient tolerated the procedure well without complications

## 2019-01-09 NOTE — Anesthesia Preprocedure Evaluation (Addendum)
Anesthesia Evaluation  Patient identified by MRN, date of birth, ID band Patient awake    Reviewed: Allergy & Precautions, NPO status , Patient's Chart, lab work & pertinent test results  History of Anesthesia Complications (+) history of anesthetic complications  Airway Mallampati: II  TM Distance: <3 FB Neck ROM: Full    Dental no notable dental hx.    Pulmonary neg pulmonary ROS, former smoker,    Pulmonary exam normal breath sounds clear to auscultation + decreased breath sounds      Cardiovascular hypertension, Normal cardiovascular exam Rhythm:Regular Rate:Normal     Neuro/Psych negative neurological ROS  negative psych ROS   GI/Hepatic negative GI ROS, (+)     substance abuse  alcohol use,   Endo/Other  diabetesMorbid obesity  Renal/GU ESRFRenal disease  negative genitourinary   Musculoskeletal negative musculoskeletal ROS (+)   Abdominal (+) + obese,   Peds negative pediatric ROS (+)  Hematology negative hematology ROS (+) anemia ,   Anesthesia Other Findings   Reproductive/Obstetrics negative OB ROS                            Anesthesia Physical Anesthesia Plan  ASA: IV  Anesthesia Plan: MAC   Post-op Pain Management:  Regional for Post-op pain   Induction: Intravenous  PONV Risk Score and Plan: 1 and Ondansetron  Airway Management Planned: Simple Face Mask  Additional Equipment:   Intra-op Plan:   Post-operative Plan:   Informed Consent: I have reviewed the patients History and Physical, chart, labs and discussed the procedure including the risks, benefits and alternatives for the proposed anesthesia with the patient or authorized representative who has indicated his/her understanding and acceptance.     Dental advisory given  Plan Discussed with: CRNA and Surgeon  Anesthesia Plan Comments: (Patient agreeable to a SCB with MAC)       Anesthesia  Quick Evaluation

## 2019-01-09 NOTE — Anesthesia Postprocedure Evaluation (Signed)
Anesthesia Post Note  Patient: Michael Riggs  Procedure(s) Performed: ARTERIOVENOUS (AV) FISTULA CREATION LEFT UPPER ARM (Left Arm Upper)     Patient location during evaluation: PACU Anesthesia Type: MAC Level of consciousness: awake and alert Pain management: pain level controlled Vital Signs Assessment: post-procedure vital signs reviewed and stable Respiratory status: spontaneous breathing, nonlabored ventilation, respiratory function stable and patient connected to nasal cannula oxygen Cardiovascular status: stable and blood pressure returned to baseline Postop Assessment: no apparent nausea or vomiting Anesthetic complications: no    Last Vitals:  Vitals:   01/09/19 0911  BP: (!) 167/101  Pulse: 91  Resp: 20  Temp: 36.8 C  SpO2: 99%    Last Pain:  Vitals:   01/09/19 0946  TempSrc:   PainSc: 0-No pain                 Aceson Labell S

## 2019-01-10 ENCOUNTER — Encounter (HOSPITAL_COMMUNITY): Payer: Self-pay | Admitting: Vascular Surgery

## 2019-01-11 ENCOUNTER — Other Ambulatory Visit: Payer: Self-pay | Admitting: Radiology

## 2019-01-11 ENCOUNTER — Other Ambulatory Visit: Payer: Self-pay | Admitting: Student

## 2019-01-12 ENCOUNTER — Ambulatory Visit (HOSPITAL_COMMUNITY): Admission: RE | Admit: 2019-01-12 | Payer: Medicare Other | Source: Ambulatory Visit

## 2019-01-12 DIAGNOSIS — E1122 Type 2 diabetes mellitus with diabetic chronic kidney disease: Secondary | ICD-10-CM | POA: Diagnosis not present

## 2019-01-12 DIAGNOSIS — Z9181 History of falling: Secondary | ICD-10-CM | POA: Diagnosis not present

## 2019-01-12 DIAGNOSIS — N184 Chronic kidney disease, stage 4 (severe): Secondary | ICD-10-CM | POA: Diagnosis not present

## 2019-01-12 DIAGNOSIS — Z9119 Patient's noncompliance with other medical treatment and regimen: Secondary | ICD-10-CM | POA: Diagnosis not present

## 2019-01-12 DIAGNOSIS — L89890 Pressure ulcer of other site, unstageable: Secondary | ICD-10-CM | POA: Diagnosis not present

## 2019-01-12 DIAGNOSIS — Z89512 Acquired absence of left leg below knee: Secondary | ICD-10-CM | POA: Diagnosis not present

## 2019-01-12 DIAGNOSIS — K219 Gastro-esophageal reflux disease without esophagitis: Secondary | ICD-10-CM | POA: Diagnosis not present

## 2019-01-12 DIAGNOSIS — Z7984 Long term (current) use of oral hypoglycemic drugs: Secondary | ICD-10-CM | POA: Diagnosis not present

## 2019-01-12 DIAGNOSIS — E1151 Type 2 diabetes mellitus with diabetic peripheral angiopathy without gangrene: Secondary | ICD-10-CM | POA: Diagnosis not present

## 2019-01-12 DIAGNOSIS — E785 Hyperlipidemia, unspecified: Secondary | ICD-10-CM | POA: Diagnosis not present

## 2019-01-12 DIAGNOSIS — I129 Hypertensive chronic kidney disease with stage 1 through stage 4 chronic kidney disease, or unspecified chronic kidney disease: Secondary | ICD-10-CM | POA: Diagnosis not present

## 2019-01-16 DIAGNOSIS — E118 Type 2 diabetes mellitus with unspecified complications: Secondary | ICD-10-CM | POA: Diagnosis not present

## 2019-01-16 DIAGNOSIS — N179 Acute kidney failure, unspecified: Secondary | ICD-10-CM | POA: Diagnosis not present

## 2019-01-16 DIAGNOSIS — J208 Acute bronchitis due to other specified organisms: Secondary | ICD-10-CM | POA: Diagnosis not present

## 2019-01-17 ENCOUNTER — Other Ambulatory Visit: Payer: Self-pay

## 2019-01-17 DIAGNOSIS — N186 End stage renal disease: Secondary | ICD-10-CM

## 2019-01-18 DIAGNOSIS — Z992 Dependence on renal dialysis: Secondary | ICD-10-CM

## 2019-01-18 DIAGNOSIS — N186 End stage renal disease: Secondary | ICD-10-CM

## 2019-01-18 DIAGNOSIS — A539 Syphilis, unspecified: Secondary | ICD-10-CM

## 2019-01-18 HISTORY — DX: Syphilis, unspecified: A53.9

## 2019-01-18 HISTORY — DX: End stage renal disease: N18.6

## 2019-01-18 HISTORY — DX: Dependence on renal dialysis: Z99.2

## 2019-01-19 DIAGNOSIS — Z9181 History of falling: Secondary | ICD-10-CM | POA: Diagnosis not present

## 2019-01-19 DIAGNOSIS — I129 Hypertensive chronic kidney disease with stage 1 through stage 4 chronic kidney disease, or unspecified chronic kidney disease: Secondary | ICD-10-CM | POA: Diagnosis not present

## 2019-01-19 DIAGNOSIS — Z9119 Patient's noncompliance with other medical treatment and regimen: Secondary | ICD-10-CM | POA: Diagnosis not present

## 2019-01-19 DIAGNOSIS — Z7984 Long term (current) use of oral hypoglycemic drugs: Secondary | ICD-10-CM | POA: Diagnosis not present

## 2019-01-19 DIAGNOSIS — L89890 Pressure ulcer of other site, unstageable: Secondary | ICD-10-CM | POA: Diagnosis not present

## 2019-01-19 DIAGNOSIS — Z89512 Acquired absence of left leg below knee: Secondary | ICD-10-CM | POA: Diagnosis not present

## 2019-01-19 DIAGNOSIS — K219 Gastro-esophageal reflux disease without esophagitis: Secondary | ICD-10-CM | POA: Diagnosis not present

## 2019-01-19 DIAGNOSIS — E785 Hyperlipidemia, unspecified: Secondary | ICD-10-CM | POA: Diagnosis not present

## 2019-01-19 DIAGNOSIS — E1151 Type 2 diabetes mellitus with diabetic peripheral angiopathy without gangrene: Secondary | ICD-10-CM | POA: Diagnosis not present

## 2019-01-19 DIAGNOSIS — E1122 Type 2 diabetes mellitus with diabetic chronic kidney disease: Secondary | ICD-10-CM | POA: Diagnosis not present

## 2019-01-19 DIAGNOSIS — N184 Chronic kidney disease, stage 4 (severe): Secondary | ICD-10-CM | POA: Diagnosis not present

## 2019-01-26 DIAGNOSIS — Z89512 Acquired absence of left leg below knee: Secondary | ICD-10-CM | POA: Diagnosis not present

## 2019-01-26 DIAGNOSIS — E1151 Type 2 diabetes mellitus with diabetic peripheral angiopathy without gangrene: Secondary | ICD-10-CM | POA: Diagnosis not present

## 2019-01-26 DIAGNOSIS — L89890 Pressure ulcer of other site, unstageable: Secondary | ICD-10-CM | POA: Diagnosis not present

## 2019-01-26 DIAGNOSIS — Z9181 History of falling: Secondary | ICD-10-CM | POA: Diagnosis not present

## 2019-01-26 DIAGNOSIS — I129 Hypertensive chronic kidney disease with stage 1 through stage 4 chronic kidney disease, or unspecified chronic kidney disease: Secondary | ICD-10-CM | POA: Diagnosis not present

## 2019-01-26 DIAGNOSIS — E1122 Type 2 diabetes mellitus with diabetic chronic kidney disease: Secondary | ICD-10-CM | POA: Diagnosis not present

## 2019-01-26 DIAGNOSIS — N184 Chronic kidney disease, stage 4 (severe): Secondary | ICD-10-CM | POA: Diagnosis not present

## 2019-01-26 DIAGNOSIS — K219 Gastro-esophageal reflux disease without esophagitis: Secondary | ICD-10-CM | POA: Diagnosis not present

## 2019-01-26 DIAGNOSIS — E785 Hyperlipidemia, unspecified: Secondary | ICD-10-CM | POA: Diagnosis not present

## 2019-01-26 DIAGNOSIS — Z9119 Patient's noncompliance with other medical treatment and regimen: Secondary | ICD-10-CM | POA: Diagnosis not present

## 2019-01-26 DIAGNOSIS — Z7984 Long term (current) use of oral hypoglycemic drugs: Secondary | ICD-10-CM | POA: Diagnosis not present

## 2019-01-30 ENCOUNTER — Other Ambulatory Visit: Payer: Self-pay

## 2019-01-30 ENCOUNTER — Encounter (HOSPITAL_COMMUNITY): Payer: Self-pay

## 2019-01-30 ENCOUNTER — Inpatient Hospital Stay (HOSPITAL_COMMUNITY)
Admission: EM | Admit: 2019-01-30 | Discharge: 2019-02-03 | DRG: 673 | Disposition: A | Discharge status: Home / Self Care | Payer: Medicare Other | Attending: Internal Medicine | Admitting: Internal Medicine

## 2019-01-30 ENCOUNTER — Emergency Department (HOSPITAL_COMMUNITY): Payer: Medicare Other

## 2019-01-30 ENCOUNTER — Inpatient Hospital Stay (HOSPITAL_COMMUNITY): Payer: Medicare Other

## 2019-01-30 DIAGNOSIS — Z4901 Encounter for fitting and adjustment of extracorporeal dialysis catheter: Secondary | ICD-10-CM | POA: Diagnosis not present

## 2019-01-30 DIAGNOSIS — E8770 Fluid overload, unspecified: Secondary | ICD-10-CM | POA: Diagnosis not present

## 2019-01-30 DIAGNOSIS — E785 Hyperlipidemia, unspecified: Secondary | ICD-10-CM | POA: Diagnosis not present

## 2019-01-30 DIAGNOSIS — R339 Retention of urine, unspecified: Secondary | ICD-10-CM | POA: Diagnosis not present

## 2019-01-30 DIAGNOSIS — E162 Hypoglycemia, unspecified: Secondary | ICD-10-CM

## 2019-01-30 DIAGNOSIS — E119 Type 2 diabetes mellitus without complications: Secondary | ICD-10-CM | POA: Diagnosis not present

## 2019-01-30 DIAGNOSIS — Z7984 Long term (current) use of oral hypoglycemic drugs: Secondary | ICD-10-CM

## 2019-01-30 DIAGNOSIS — Z87891 Personal history of nicotine dependence: Secondary | ICD-10-CM

## 2019-01-30 DIAGNOSIS — E669 Obesity, unspecified: Secondary | ICD-10-CM | POA: Diagnosis present

## 2019-01-30 DIAGNOSIS — K219 Gastro-esophageal reflux disease without esophagitis: Secondary | ICD-10-CM | POA: Diagnosis present

## 2019-01-30 DIAGNOSIS — A539 Syphilis, unspecified: Secondary | ICD-10-CM | POA: Diagnosis present

## 2019-01-30 DIAGNOSIS — I10 Essential (primary) hypertension: Secondary | ICD-10-CM | POA: Diagnosis not present

## 2019-01-30 DIAGNOSIS — E662 Morbid (severe) obesity with alveolar hypoventilation: Secondary | ICD-10-CM | POA: Diagnosis present

## 2019-01-30 DIAGNOSIS — I120 Hypertensive chronic kidney disease with stage 5 chronic kidney disease or end stage renal disease: Secondary | ICD-10-CM | POA: Diagnosis not present

## 2019-01-30 DIAGNOSIS — E875 Hyperkalemia: Secondary | ICD-10-CM | POA: Diagnosis present

## 2019-01-30 DIAGNOSIS — E11649 Type 2 diabetes mellitus with hypoglycemia without coma: Secondary | ICD-10-CM | POA: Diagnosis not present

## 2019-01-30 DIAGNOSIS — A530 Latent syphilis, unspecified as early or late: Secondary | ICD-10-CM | POA: Diagnosis present

## 2019-01-30 DIAGNOSIS — E1169 Type 2 diabetes mellitus with other specified complication: Secondary | ICD-10-CM | POA: Diagnosis not present

## 2019-01-30 DIAGNOSIS — I129 Hypertensive chronic kidney disease with stage 1 through stage 4 chronic kidney disease, or unspecified chronic kidney disease: Secondary | ICD-10-CM | POA: Diagnosis not present

## 2019-01-30 DIAGNOSIS — Z9119 Patient's noncompliance with other medical treatment and regimen: Secondary | ICD-10-CM | POA: Diagnosis not present

## 2019-01-30 DIAGNOSIS — Z6841 Body Mass Index (BMI) 40.0 and over, adult: Secondary | ICD-10-CM | POA: Diagnosis not present

## 2019-01-30 DIAGNOSIS — R39198 Other difficulties with micturition: Secondary | ICD-10-CM | POA: Diagnosis not present

## 2019-01-30 DIAGNOSIS — L89890 Pressure ulcer of other site, unstageable: Secondary | ICD-10-CM | POA: Diagnosis not present

## 2019-01-30 DIAGNOSIS — I517 Cardiomegaly: Secondary | ICD-10-CM | POA: Diagnosis not present

## 2019-01-30 DIAGNOSIS — N186 End stage renal disease: Secondary | ICD-10-CM | POA: Diagnosis not present

## 2019-01-30 DIAGNOSIS — R34 Anuria and oliguria: Secondary | ICD-10-CM

## 2019-01-30 DIAGNOSIS — Z79899 Other long term (current) drug therapy: Secondary | ICD-10-CM | POA: Diagnosis not present

## 2019-01-30 DIAGNOSIS — Z89512 Acquired absence of left leg below knee: Secondary | ICD-10-CM | POA: Diagnosis not present

## 2019-01-30 DIAGNOSIS — E1151 Type 2 diabetes mellitus with diabetic peripheral angiopathy without gangrene: Secondary | ICD-10-CM | POA: Diagnosis present

## 2019-01-30 DIAGNOSIS — Z9181 History of falling: Secondary | ICD-10-CM | POA: Diagnosis not present

## 2019-01-30 DIAGNOSIS — R52 Pain, unspecified: Secondary | ICD-10-CM | POA: Diagnosis not present

## 2019-01-30 DIAGNOSIS — J811 Chronic pulmonary edema: Secondary | ICD-10-CM | POA: Diagnosis not present

## 2019-01-30 DIAGNOSIS — N19 Unspecified kidney failure: Secondary | ICD-10-CM | POA: Diagnosis not present

## 2019-01-30 DIAGNOSIS — N179 Acute kidney failure, unspecified: Secondary | ICD-10-CM

## 2019-01-30 DIAGNOSIS — E876 Hypokalemia: Secondary | ICD-10-CM

## 2019-01-30 DIAGNOSIS — Z20828 Contact with and (suspected) exposure to other viral communicable diseases: Secondary | ICD-10-CM | POA: Diagnosis present

## 2019-01-30 DIAGNOSIS — E872 Acidosis: Secondary | ICD-10-CM | POA: Diagnosis not present

## 2019-01-30 DIAGNOSIS — F329 Major depressive disorder, single episode, unspecified: Secondary | ICD-10-CM | POA: Diagnosis present

## 2019-01-30 DIAGNOSIS — G9341 Metabolic encephalopathy: Secondary | ICD-10-CM | POA: Diagnosis not present

## 2019-01-30 DIAGNOSIS — R601 Generalized edema: Secondary | ICD-10-CM | POA: Diagnosis not present

## 2019-01-30 DIAGNOSIS — E1122 Type 2 diabetes mellitus with diabetic chronic kidney disease: Secondary | ICD-10-CM | POA: Diagnosis present

## 2019-01-30 DIAGNOSIS — D72829 Elevated white blood cell count, unspecified: Secondary | ICD-10-CM | POA: Diagnosis not present

## 2019-01-30 DIAGNOSIS — N185 Chronic kidney disease, stage 5: Secondary | ICD-10-CM | POA: Diagnosis not present

## 2019-01-30 DIAGNOSIS — W19XXXA Unspecified fall, initial encounter: Secondary | ICD-10-CM | POA: Diagnosis not present

## 2019-01-30 DIAGNOSIS — N184 Chronic kidney disease, stage 4 (severe): Secondary | ICD-10-CM | POA: Diagnosis not present

## 2019-01-30 DIAGNOSIS — R531 Weakness: Secondary | ICD-10-CM | POA: Diagnosis not present

## 2019-01-30 DIAGNOSIS — R4020 Unspecified coma: Secondary | ICD-10-CM | POA: Diagnosis not present

## 2019-01-30 DIAGNOSIS — D539 Nutritional anemia, unspecified: Secondary | ICD-10-CM

## 2019-01-30 DIAGNOSIS — R609 Edema, unspecified: Secondary | ICD-10-CM | POA: Diagnosis not present

## 2019-01-30 HISTORY — DX: Hypoglycemia, unspecified: E16.2

## 2019-01-30 HISTORY — DX: Nutritional anemia, unspecified: D53.9

## 2019-01-30 HISTORY — DX: Acute kidney failure, unspecified: N17.9

## 2019-01-30 HISTORY — DX: Hypokalemia: E87.6

## 2019-01-30 HISTORY — DX: Hyperkalemia: E87.5

## 2019-01-30 LAB — POCT I-STAT 7, (LYTES, BLD GAS, ICA,H+H)
Acid-base deficit: 17 mmol/L — ABNORMAL HIGH (ref 0.0–2.0)
Bicarbonate: 10.7 mmol/L — ABNORMAL LOW (ref 20.0–28.0)
Calcium, Ion: 1.01 mmol/L — ABNORMAL LOW (ref 1.15–1.40)
HCT: 24 % — ABNORMAL LOW (ref 39.0–52.0)
Hemoglobin: 8.2 g/dL — ABNORMAL LOW (ref 13.0–17.0)
O2 Saturation: 93 %
Potassium: 6.1 mmol/L — ABNORMAL HIGH (ref 3.5–5.1)
Sodium: 136 mmol/L (ref 135–145)
TCO2: 12 mmol/L — ABNORMAL LOW (ref 22–32)
pCO2 arterial: 31 mmHg — ABNORMAL LOW (ref 32.0–48.0)
pH, Arterial: 7.148 — CL (ref 7.350–7.450)
pO2, Arterial: 87 mmHg (ref 83.0–108.0)

## 2019-01-30 LAB — BASIC METABOLIC PANEL
Anion gap: 18 — ABNORMAL HIGH (ref 5–15)
BUN: 154 mg/dL — ABNORMAL HIGH (ref 6–20)
CO2: 9 mmol/L — ABNORMAL LOW (ref 22–32)
Calcium: 6.9 mg/dL — ABNORMAL LOW (ref 8.9–10.3)
Chloride: 109 mmol/L (ref 98–111)
Creatinine, Ser: 19.78 mg/dL — ABNORMAL HIGH (ref 0.61–1.24)
GFR calc Af Amer: 3 mL/min — ABNORMAL LOW (ref >60)
GFR calc non Af Amer: 2 mL/min — ABNORMAL LOW (ref >60)
Glucose, Bld: 73 mg/dL (ref 70–99)
Potassium: 6.4 mmol/L (ref 3.5–5.1)
Sodium: 136 mmol/L (ref 135–145)

## 2019-01-30 LAB — POCT I-STAT EG7
Acid-base deficit: 18 mmol/L — ABNORMAL HIGH (ref 0.0–2.0)
Bicarbonate: 8.9 mmol/L — ABNORMAL LOW (ref 20.0–28.0)
Calcium, Ion: 0.87 mmol/L — CL (ref 1.15–1.40)
HCT: 25 % — ABNORMAL LOW (ref 39.0–52.0)
Hemoglobin: 8.5 g/dL — ABNORMAL LOW (ref 13.0–17.0)
O2 Saturation: 98 %
Potassium: 6.3 mmol/L (ref 3.5–5.1)
Sodium: 136 mmol/L (ref 135–145)
TCO2: 10 mmol/L — ABNORMAL LOW (ref 22–32)
pCO2, Ven: 23 mmHg — ABNORMAL LOW (ref 44.0–60.0)
pH, Ven: 7.196 — CL (ref 7.250–7.430)
pO2, Ven: 125 mmHg — ABNORMAL HIGH (ref 32.0–45.0)

## 2019-01-30 LAB — HEPATIC FUNCTION PANEL
ALT: 10 U/L (ref 0–44)
AST: 17 U/L (ref 15–41)
Albumin: 2 g/dL — ABNORMAL LOW (ref 3.5–5.0)
Alkaline Phosphatase: 68 U/L (ref 38–126)
Bilirubin, Direct: 0.2 mg/dL (ref 0.0–0.2)
Indirect Bilirubin: 0.5 mg/dL (ref 0.3–0.9)
Total Bilirubin: 0.7 mg/dL (ref 0.3–1.2)
Total Protein: 6.8 g/dL (ref 6.5–8.1)

## 2019-01-30 LAB — CBC
HCT: 28.4 % — ABNORMAL LOW (ref 39.0–52.0)
Hemoglobin: 8.3 g/dL — ABNORMAL LOW (ref 13.0–17.0)
MCH: 30.3 pg (ref 26.0–34.0)
MCHC: 29.2 g/dL — ABNORMAL LOW (ref 30.0–36.0)
MCV: 103.6 fL — ABNORMAL HIGH (ref 80.0–100.0)
Platelets: 221 10*3/uL (ref 150–400)
RBC: 2.74 MIL/uL — ABNORMAL LOW (ref 4.22–5.81)
RDW: 13.8 % (ref 11.5–15.5)
WBC: 14.6 10*3/uL — ABNORMAL HIGH (ref 4.0–10.5)
nRBC: 0 % (ref 0.0–0.2)

## 2019-01-30 LAB — SARS CORONAVIRUS 2 BY RT PCR (HOSPITAL ORDER, PERFORMED IN ~~LOC~~ HOSPITAL LAB): SARS Coronavirus 2: NEGATIVE

## 2019-01-30 LAB — AMMONIA: Ammonia: 28 umol/L (ref 9–35)

## 2019-01-30 LAB — CBG MONITORING, ED
Glucose-Capillary: 66 mg/dL — ABNORMAL LOW (ref 70–99)
Glucose-Capillary: 92 mg/dL (ref 70–99)
Glucose-Capillary: 83 mg/dL (ref 70–99)
Glucose-Capillary: 76 mg/dL (ref 70–99)
Glucose-Capillary: 152 mg/dL — ABNORMAL HIGH (ref 70–99)
Glucose-Capillary: 64 mg/dL — ABNORMAL LOW (ref 70–99)

## 2019-01-30 LAB — LACTIC ACID, PLASMA: Lactic Acid, Venous: 0.6 mmol/L (ref 0.5–1.9)

## 2019-01-30 IMAGING — DX PORTABLE CHEST - 1 VIEW
1 series · 1 of 1 positions shown · non-contrast
Comparison: [DATE]

CLINICAL DATA: Weakness

EXAM:
PORTABLE CHEST 1 VIEW

[chest]
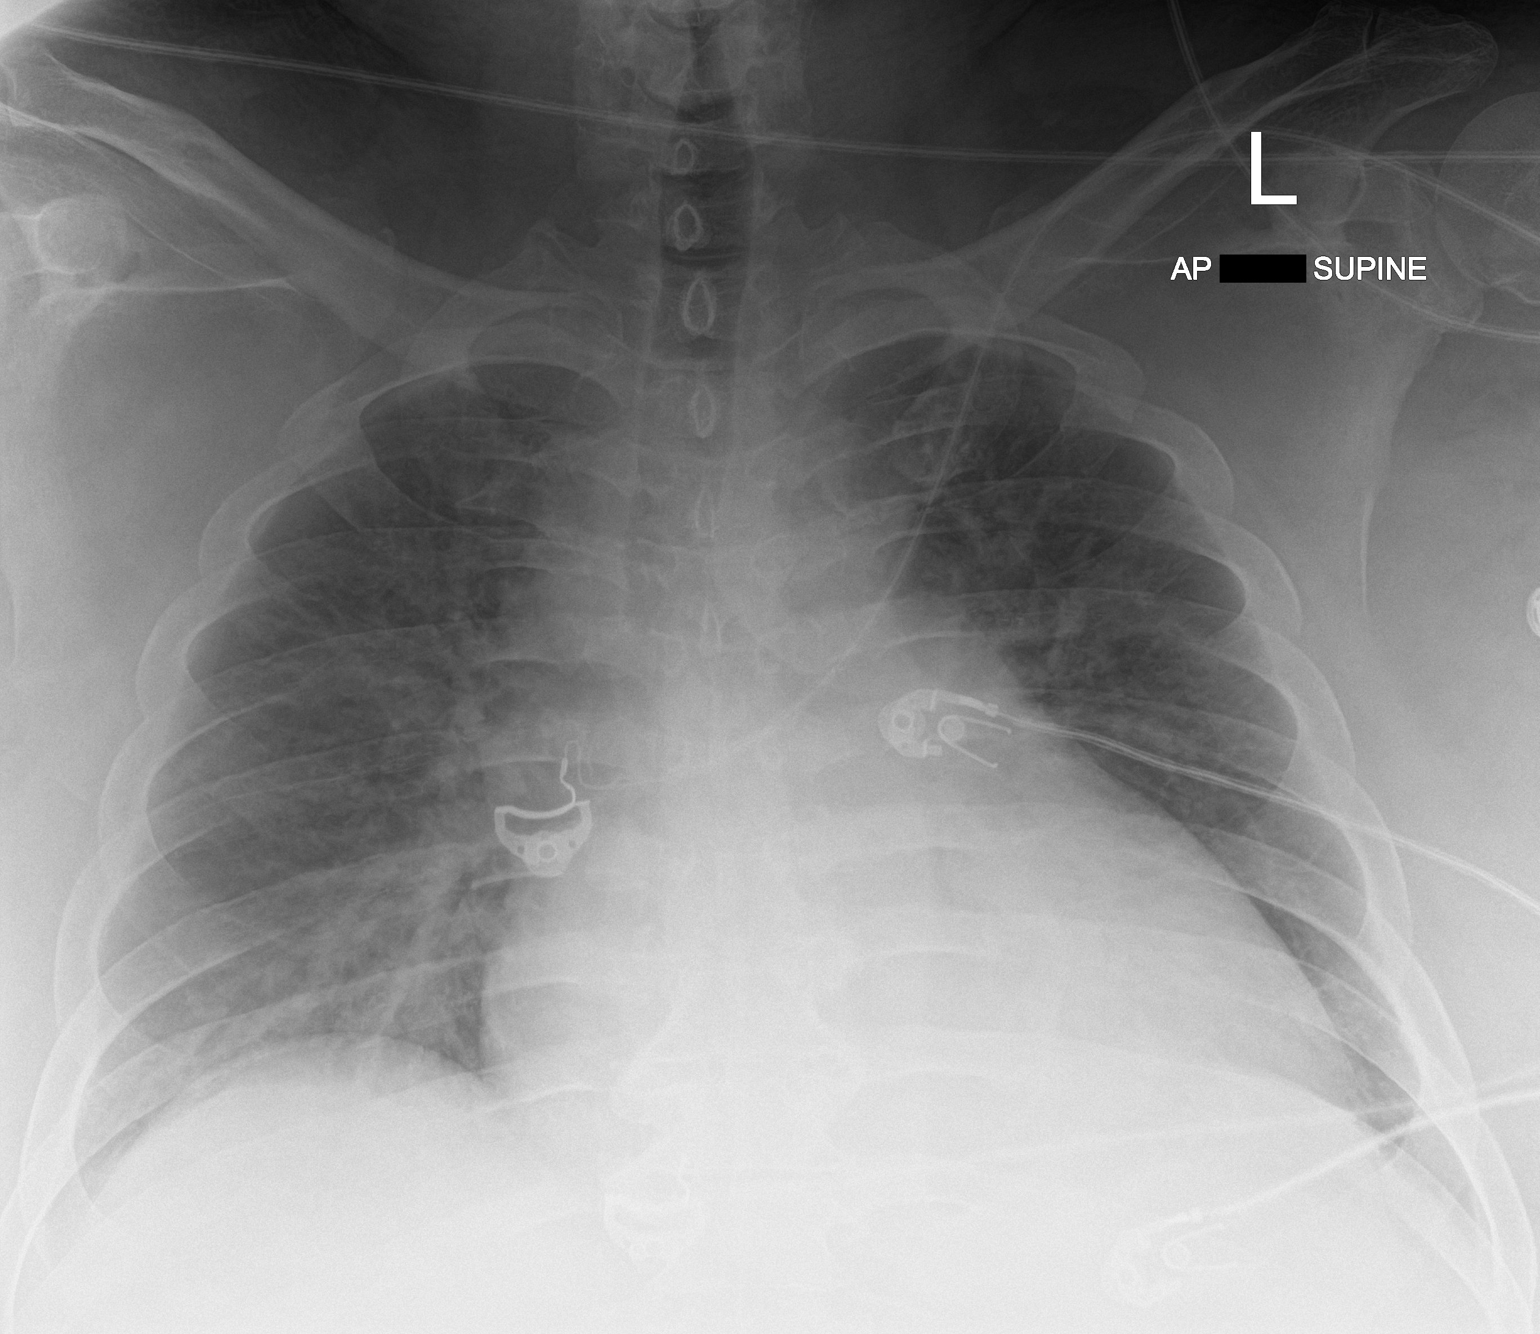

[1 of 1 positions shown; findings below may reference images not displayed]

FINDINGS: Cardiomegaly with vascular congestion. Mild perihilar and bibasilar
opacities concerning for early edema. No effusions. No acute bony
abnormality.
IMPRESSION: Cardiomegaly with vascular congestion and possible early perihilar
edema.

## 2019-01-30 MED ORDER — SODIUM CHLORIDE 0.9 % IV SOLN
1.0000 g | Freq: Once | INTRAVENOUS | Status: AC
  Administered 2019-01-30: 1 g via INTRAVENOUS
  Filled 2019-01-30: qty 10

## 2019-01-30 MED ORDER — DEXTROSE 50 % IV SOLN
25.0000 mL | Freq: Once | INTRAVENOUS | Status: AC
  Administered 2019-01-30: 25 mL via INTRAVENOUS

## 2019-01-30 MED ORDER — ACETAMINOPHEN 650 MG RE SUPP: 650.0000 mg | Freq: Four times a day (QID) | RECTAL | Status: DC | PRN

## 2019-01-30 MED ORDER — SODIUM BICARBONATE 8.4 % IV SOLN
INTRAVENOUS | Status: DC
  Administered 2019-01-30 – 2019-02-01 (×3): via INTRAVENOUS
  Filled 2019-01-30 (×5): qty 150

## 2019-01-30 MED ORDER — ACETAMINOPHEN 325 MG PO TABS
650.0000 mg | ORAL_TABLET | Freq: Four times a day (QID) | ORAL | Status: DC | PRN
  Administered 2019-02-02 (×2): 650 mg via ORAL
  Filled 2019-01-30 (×2): qty 2

## 2019-01-30 MED ORDER — SODIUM ZIRCONIUM CYCLOSILICATE 10 G PO PACK
10.0000 g | PACK | Freq: Three times a day (TID) | ORAL | Status: AC
  Filled 2019-01-30 (×3): qty 1

## 2019-01-30 MED ORDER — HYDRALAZINE HCL 20 MG/ML IJ SOLN
10.0000 mg | INTRAMUSCULAR | Status: DC | PRN
  Administered 2019-01-31: 10 mg via INTRAVENOUS

## 2019-01-30 MED ORDER — SODIUM ZIRCONIUM CYCLOSILICATE 10 G PO PACK
10.0000 g | PACK | Freq: Once | ORAL | Status: DC
  Filled 2019-01-30: qty 1

## 2019-01-30 MED ORDER — SODIUM BICARBONATE 8.4 % IV SOLN
50.0000 meq | Freq: Once | INTRAVENOUS | Status: AC
  Administered 2019-01-30: 50 meq via INTRAVENOUS
  Filled 2019-01-30: qty 50

## 2019-01-30 MED ORDER — DEXTROSE 50 % IV SOLN
INTRAVENOUS | Status: AC
  Administered 2019-01-30: 25 mL via INTRAVENOUS
  Filled 2019-01-30: qty 50

## 2019-01-30 MED ORDER — AMLODIPINE BESYLATE 5 MG PO TABS: 10.0000 mg | ORAL_TABLET | Freq: Every day | ORAL | Status: DC

## 2019-01-30 MED ORDER — CHLORHEXIDINE GLUCONATE CLOTH 2 % EX PADS
6.0000 | MEDICATED_PAD | Freq: Every day | CUTANEOUS | Status: DC
  Administered 2019-01-31 – 2019-02-03 (×4): 6 via TOPICAL

## 2019-01-30 MED ORDER — DEXTROSE 50 % IV SOLN
0.5000 | Freq: Once | INTRAVENOUS | Status: AC
  Administered 2019-01-30: 25 mL via INTRAVENOUS
  Filled 2019-01-30: qty 50

## 2019-01-30 MED ORDER — DARBEPOETIN ALFA 60 MCG/0.3ML IJ SOSY
60.0000 ug | PREFILLED_SYRINGE | INTRAMUSCULAR | Status: DC
  Administered 2019-01-31: 60 ug via INTRAVENOUS

## 2019-01-30 NOTE — ED Notes (Signed)
Attempted to place urethral cath and was unsuccessful.

## 2019-01-30 NOTE — ED Triage Notes (Signed)
Per [place], pt from home with complaint of lethargy x 2 days. Pt had fistula placed in left arm recently in preparation for dialysis next week. Pt has bite mark on side of his tongue, denies seizure or hx. Also has dried blood to nose from recent nosebleed, but no active bleeding now. Family states "he just hasn't been acting right for the past 2 days" Pt has left BKA.

## 2019-01-30 NOTE — H&P (Signed)
History and Physical    Michael Riggs JME:[id number] DOB: [birth year] DOA: 01/30/2019  PCP: [name], MD  Patient coming from: Home.  History obtained from patient's sister-in-law ER physician and previous records.  Chief Complaint: Altered mental status.  HPI: Michael Riggs is a 48 y.o. male with history of poorly controlled diabetes mellitus type 2, chronic kidney disease stage V, hypertension, chronic anemia recently diagnosed syphilis was brought to the ER after patient's family found that patient was getting increasingly confused.  Per family patient has become increasingly weak over the last 2 weeks.  Had a recent left upper extremity fistula placed.  Patient also recently was taken to health care department about a week ago for RPR being positive and was given penicillin injection.  Has 2 more doses scheduled as per the family.  Since patient's weakness was getting more profound and at this time also getting confused and patient hardly eating anything last few days and not taking his medications patient was brought to the ER.  ED Course: In the ER patient has responding to some questions and is oriented to his name moves all extremities.  Patient is afebrile.  Labs show creatinine of 19.7 which is increased from 9.3 last month BUN of 154 which is increased 165.  Potassium was 6.4 EKG was showing normal sinus rhythm.  Hemoglobin was 8.3 which is slightly decreased from previous.  Chest x-ray shows chronic congestion EKG shows sinus rhythm.  On-call nephrology was consulted.  For the hyperkalemia patient was given calcium gluconate 1 ampoule of bicarb.  Patient was having recurrent episodes of hypoglycemia for which patient was given D50.  Patient was started on bicarb drip and admitted for uremic encephalopathy with hyperkalemia and hypoglycemia.  Review of Systems: As per HPI, rest all negative.   Past Medical History:  Diagnosis Date  . Anemia   . Chronic kidney  disease    Stage 5  . Depression   . Diabetes mellitus without complication ([place])   . Dyspnea   . GERD (gastroesophageal reflux disease)   . Hypertension     Past Surgical History:  Procedure Laterality Date  . AV FISTULA PLACEMENT Left 01/09/2019   Procedure: ARTERIOVENOUS (AV) FISTULA CREATION LEFT UPPER ARM;  Surgeon: [name], MD;  Location: [facility] OR;  Service: Vascular;  Laterality: Left;  . below the knee amputation Left   . EYE SURGERY       reports that he has quit smoking. He has never used smokeless tobacco. He reports previous alcohol use. He reports previous drug use.  No Known Allergies  History reviewed. No pertinent family history.  Prior to Admission medications   Medication Sig Start Date End Date Taking? Authorizing Provider  albuterol (VENTOLIN HFA) 108 (90 Base) MCG/ACT inhaler Inhale into the lungs every 6 (six) hours as needed for wheezing or shortness of breath.    [provider]  amLODipine (NORVASC) 10 MG tablet Take 10 mg by mouth daily.    [provider]  atorvastatin (LIPITOR) 40 MG tablet Take 40 mg by mouth daily.  07/19/18   [provider]  calcium carbonate (TUMS - DOSED IN MG ELEMENTAL CALCIUM) 500 MG chewable tablet Chew 1 tablet by mouth daily. As needed    [provider]  citalopram (CELEXA) 40 MG tablet Take 40 mg by mouth daily.  12/13/16   [provider]  doxycycline (VIBRAMYCIN) 100 MG capsule Take 1 capsule (100 mg total) by mouth 2 (two)  times daily. 12/30/18   Alroy Bailiff, Margaux, PA-C  gabapentin (NEURONTIN) 300 MG capsule Take 300 mg by mouth 3 (three) times daily.  02/15/17   [provider]  glipiZIDE (GLUCOTROL) 10 MG tablet Take 10 mg by mouth 2 (two) times daily before a meal.  12/13/16   [provider]  hydrOXYzine (ATARAX/VISTARIL) 25 MG tablet Take 25 mg by mouth every 8 (eight) hours as needed for anxiety.  04/26/18   [provider]  LEVEMIR 100 UNIT/ML  injection  03/16/17   [provider]  liraglutide (VICTOZA) 18 MG/3ML SOPN Inject 1.8 mg into the skin daily.    [provider]  losartan-hydrochlorothiazide (HYZAAR) 100-12.5 MG tablet Take 1 tablet by mouth daily.  12/13/16   [provider]  Melatonin 5 MG CAPS Take 1 capsule by mouth at bedtime.    [provider]  NOVOFINE PLUS 32G X 4 MM MISC  08/17/18   [provider]  omeprazole (PRILOSEC) 40 MG capsule Take 40 mg by mouth daily.    [provider]  ondansetron (ZOFRAN) 4 MG tablet Take 4 mg by mouth daily as needed for nausea or vomiting.  07/19/18   [provider]  oxyCODONE-acetaminophen (PERCOCET) 5-325 MG tablet Take 1 tablet by mouth every 6 (six) hours as needed for severe pain. 01/09/19   Rhyne, Hulen Shouts, PA-C  sodium bicarbonate 650 MG tablet Take 650 mg by mouth 2 (two) times daily.    [provider]  traMADol (ULTRAM) 50 MG tablet Take by mouth every 6 (six) hours as needed.    [provider]    Physical Exam: Constitutional: Moderately built and nourished. Vitals:   01/30/19 2136 01/30/19 2145 01/30/19 2300 01/30/19 2345  BP: (!) 133/104 (!) 139/97 (!) 144/101 (!) 150/104  Pulse: 80 81 80 81  Resp: 12 10 12 11   Temp:      TempSrc:      SpO2: 98% 95% 100% 98%   Eyes: Anicteric no pallor. ENMT: No discharge from the ears eyes nose or mouth. Neck: No mass or.  No neck rigidity. Respiratory: No rhonchi or crepitations. Cardiovascular: S1-S2 heard. Abdomen: Soft nontender bowel sounds present.  Slight pain on exam in the scrotum. Musculoskeletal: Left BKA. Skin: Left BKA some chronic skin changes. Neurologic: Patient is oriented to his name otherwise confused but moves all extremities. Psychiatric: Oriented to his name.   Labs on Admission: I have personally reviewed following labs and imaging studies  CBC: Recent Labs  Lab 01/30/19 1756 01/30/19 2058 01/30/19 2339  WBC 14.6*   --   --   HGB 8.3* 8.5* 8.2*  HCT 28.4* 25.0* 24.0*  MCV 103.6*  --   --   PLT 221  --   --    Basic Metabolic Panel: Recent Labs  Lab 01/30/19 1756 01/30/19 2058 01/30/19 2339  NA 136 136 136  K 6.4* 6.3* 6.1*  CL 109  --   --   CO2 9*  --   --   GLUCOSE 73  --   --   BUN 154*  --   --   CREATININE 19.78*  --   --   CALCIUM 6.9*  --   --    GFR: CrCl cannot be calculated (Unknown ideal weight.). Liver Function Tests: Recent Labs  Lab 01/30/19 2042  AST 17  ALT 10  ALKPHOS 68  BILITOT 0.7  PROT 6.8  ALBUMIN 2.0*   No results for input(s): LIPASE, AMYLASE  in the last 168 hours. Recent Labs  Lab 01/30/19 2042  AMMONIA 28   Coagulation Profile: No results for input(s): INR, PROTIME in the last 168 hours. Cardiac Enzymes: No results for input(s): CKTOTAL, CKMB, CKMBINDEX, TROPONINI in the last 168 hours. BNP (last 3 results) No results for input(s): PROBNP in the last 8760 hours. HbA1C: No results for input(s): HGBA1C in the last 72 hours. CBG: Recent Labs  Lab 01/30/19 1835 01/30/19 1931 01/30/19 2121 01/30/19 2309 01/30/19 2328  GLUCAP 92 83 76 64* 152*   Lipid Profile: No results for input(s): CHOL, HDL, LDLCALC, TRIG, CHOLHDL, LDLDIRECT in the last 72 hours. Thyroid Function Tests: No results for input(s): TSH, T4TOTAL, FREET4, T3FREE, THYROIDAB in the last 72 hours. Anemia Panel: No results for input(s): VITAMINB12, FOLATE, FERRITIN, TIBC, IRON, RETICCTPCT in the last 72 hours. Urine analysis:    Component Value Date/Time   COLORURINE YELLOW 12/30/2018 1939   APPEARANCEUR HAZY (A) 12/30/2018 1939   LABSPEC 1.019 12/30/2018 1939   PHURINE 6.0 12/30/2018 1939   GLUCOSEU 150 (A) 12/30/2018 1939   HGBUR SMALL (A) 12/30/2018 1939   BILIRUBINUR NEGATIVE 12/30/2018 [place] NEGATIVE 12/30/2018 1939   PROTEINUR >=300 (A) 12/30/2018 1939   NITRITE NEGATIVE 12/30/2018 1939   LEUKOCYTESUR NEGATIVE 12/30/2018 1939   Sepsis  Labs: @LABRCNTIP (procalcitonin:4,lacticidven:4) ) Recent Results (from the past 240 hour(s))  SARS Coronavirus 2 (CEPHEID - Performed in [place] hospital lab), Hosp Order     Status: None   Collection Time: 01/30/19  7:58 PM   Specimen: Nasopharyngeal Swab  Result Value Ref Range Status   SARS Coronavirus 2 NEGATIVE NEGATIVE Final    Comment: (NOTE) If result is NEGATIVE SARS-CoV-2 target nucleic acids are NOT DETECTED. The SARS-CoV-2 RNA is generally detectable in upper and lower  respiratory specimens during the acute phase of infection. The lowest  concentration of SARS-CoV-2 viral copies this assay can detect is 250  copies / mL. A negative result does not preclude SARS-CoV-2 infection  and should not be used as the sole basis for treatment or other  patient management decisions.  A negative result may occur with  improper specimen collection / handling, submission of specimen other  than nasopharyngeal swab, presence of viral mutation(s) within the  areas targeted by this assay, and inadequate number of viral copies  (<250 copies / mL). A negative result must be combined with clinical  observations, patient history, and epidemiological information. If result is POSITIVE SARS-CoV-2 target nucleic acids are DETECTED. The SARS-CoV-2 RNA is generally detectable in upper and lower  respiratory specimens dur ing the acute phase of infection.  Positive  results are indicative of active infection with SARS-CoV-2.  Clinical  correlation with patient history and other diagnostic information is  necessary to determine patient infection status.  Positive results do  not rule out bacterial infection or co-infection with other viruses. If result is PRESUMPTIVE POSTIVE SARS-CoV-2 nucleic acids MAY BE PRESENT.   A presumptive positive result was obtained on the submitted specimen  and confirmed on repeat testing.  While 2019 novel coronavirus  (SARS-CoV-2) nucleic acids may be present in  the submitted sample  additional confirmatory testing may be necessary for epidemiological  and / or clinical management purposes  to differentiate between  SARS-CoV-2 and other Sarbecovirus currently known to infect humans.  If clinically indicated additional testing with an alternate test  methodology [phone]) is advised. The SARS-CoV-2 RNA is generally  detectable in upper and lower respiratory sp  ecimens during the acute  phase of infection. The expected result is Negative. Fact Sheet for Patients:  StrictlyIdeas.no Fact Sheet for Healthcare Providers: BankingDealers.co.za This test is not yet approved or cleared by the [place] FDA and has been authorized for detection and/or diagnosis of SARS-CoV-2 by FDA under an Emergency Use Authorization (EUA).  This EUA will remain in effect (meaning this test can be used) for the duration of the COVID-19 declaration under Section 564(b)(1) of the Act, 21 U.S.C. section 360bbb-3(b)(1), unless the authorization is terminated or revoked sooner. Performed at [facility], [place] [address] [place], [place] [address]      Radiological Exams on Admission: Dg Chest Port 1 View  Result Date: 01/30/2019 CLINICAL DATA:  Weakness EXAM: PORTABLE CHEST 1 VIEW COMPARISON:  December 30, 2018 FINDINGS: Cardiomegaly with vascular congestion. Mild perihilar and bibasilar opacities concerning for early edema. No effusions. No acute bony abnormality. IMPRESSION: Cardiomegaly with vascular congestion and possible early perihilar edema. Electronically Signed   By: Rolm Baptise M.D.   On: 01/30/2019 18:50    EKG: Independently reviewed.  Normal sinus rhythm.  Assessment/Plan Principal Problem:   ARF (acute renal failure) (HCC) Active Problems:   Diabetes mellitus type 2 in obese (HCC)   Hyperkalemia   Hypokalemia   Hypoglycemia   Syphilis   Macrocytic anemia    1. Acute renal failure with  hyperkalemia metabolic acidosis in a patient with chronic and disease stage V with a recent left upper extremity fistula placed -appreciate nephrology consult.  For patient's hyperkalemia with no EKG changes patient was given calcium gluconate IV bicarbonate bolus 1 ampoule and started on bicarbonate drip.  Nephrology planning to get dialysis.  Closely follow respiratory status mental status. 2. Acute encephalopathy likely from uremia.  Closely observe. 3. Recurrent hypoglycemic episodes likely from poor oral intake with history of diabetes mellitus type 2 but patient's family states he has not taken his medicines for last few days including insulin.  Patient is on D5 bicarb drip closely follow CBGs every hour. 4. Hypertension we will keep patient on PRN IV hydralazine since patient is slightly confused not sure if he will be able to take his oral amlodipine. 5. Anemia likely from renal disease follow CBC. 6. Recently diagnosed syphilis on treatment per health department.  Had received intramuscular penicillin last week. 7. Left BKA. 8. Leukocytosis cause not clear.  Patient is afebrile COVID-19 test is negative.  Closely observe.  Given the complexity of patient's condition including worsening renal function uremic encephalopathy recurrent hypoglycemia patient will need inpatient status management.   DVT prophylaxis: SCDs in anticipation of procedure. Code Status: Full code. Family Communication: Patient's sister-in-law. Disposition Plan: To be determined. Consults called: Nephrology and critical care. Admission status: Inpatient.   Rise Patience MD [facility] Pager [phone].  If 7PM-7AM, please contact night-coverage [email or web address] Password [facility]  01/30/2019, 11:59 PM

## 2019-01-30 NOTE — ED Notes (Signed)
Michael Salisbury, RN on 5 w was updated by crystal, rn on pt condition. Dr. Hal Hope notified of pt status, abg results and unable to insert foley catheter on previous shift by provider and nurse per [place] to see pt again to determine if pt needs higher level of care.

## 2019-01-30 NOTE — ED Notes (Signed)
Pt continues to remove gown and monitors. Placed pt back on monitor.

## 2019-01-30 NOTE — Progress Notes (Signed)
Abnormal abg results given to RN. RN made MD aware.

## 2019-01-30 NOTE — ED Provider Notes (Signed)
[place] EMERGENCY DEPARTMENT Provider Note   CSN: [id number] Arrival date & time: 01/30/19  1702     History   Chief Complaint Chief Complaint  Patient presents with  . Fatigue    HPI Michael Riggs is a 48 y.o. male.     HPI  Patient is a 48 year old male with obesity, past medical history of DM, HTN, CKD, chronic anemia, syphilis who presented to the emergency department today via [facility] EMS from home with lethargy/altered mental status x2 days.  Patient recently had a left upper extremity fistula placed in preparation for initiation of dialysis, though has not started HD as of yet.  Patient unable to provide further pertinent history, and multiple attempts at reaching the patient's mother were unsuccessful.   Past Medical History:  Diagnosis Date  . Anemia   . Chronic kidney disease    Stage 5  . Depression   . Diabetes mellitus without complication ([place])   . Dyspnea   . GERD (gastroesophageal reflux disease)   . Hypertension     Patient Active Problem List   Diagnosis Date Noted  . PDR (proliferative diabetic retinopathy) ([place]) 12/14/2013  . Venous hypertension 09/22/2013  . Diabetes mellitus type 2 in obese ([place]) 07/04/2013  . Habitual alcohol use 07/04/2013  . Obesity, Class II, BMI 35-39.9, with comorbidity 07/04/2013    Past Surgical History:  Procedure Laterality Date  . AV FISTULA PLACEMENT Left 01/09/2019   Procedure: ARTERIOVENOUS (AV) FISTULA CREATION LEFT UPPER ARM;  Surgeon: [name], MD;  Location: [facility] OR;  Service: Vascular;  Laterality: Left;  . below the knee amputation Left   . EYE SURGERY          Home Medications    Prior to Admission medications   Medication Sig Start Date End Date Taking? Authorizing Provider  albuterol (VENTOLIN HFA) 108 (90 Base) MCG/ACT inhaler Inhale into the lungs every 6 (six) hours as needed for wheezing or shortness of breath.    [provider]  amLODipine  (NORVASC) 10 MG tablet Take 10 mg by mouth daily.    [provider]  atorvastatin (LIPITOR) 40 MG tablet TK 1 T PO D 07/19/18   [provider]  calcium carbonate (TUMS - DOSED IN MG ELEMENTAL CALCIUM) 500 MG chewable tablet Chew 1 tablet by mouth daily. As needed    [provider]  citalopram (CELEXA) 40 MG tablet TK 1 T PO ONCE D 12/13/16   [provider]  doxycycline (VIBRAMYCIN) 100 MG capsule Take 1 capsule (100 mg total) by mouth 2 (two) times daily. 12/30/18   Alroy Bailiff, Margaux, PA-C  gabapentin (NEURONTIN) 300 MG capsule TK 1 C PO TID 02/15/17   [provider]  glipiZIDE (GLUCOTROL) 10 MG tablet TK 1 T PO BID 12/13/16   [provider]  hydrOXYzine (ATARAX/VISTARIL) 25 MG tablet TK 1 T PO Q 8 H PRA 04/26/18   [provider]  LEVEMIR 100 UNIT/ML injection  03/16/17   [provider]  losartan-hydrochlorothiazide (HYZAAR) 100-12.5 MG tablet TK 1 T PO QD 12/13/16   [provider]  Melatonin 5 MG CAPS Take 1 capsule by mouth at bedtime.    [provider]  NOVOFINE PLUS 32G X 4 MM MISC  08/17/18   [provider]  omeprazole (PRILOSEC) 40 MG capsule Take 40 mg by mouth daily.    [provider]  ondansetron (ZOFRAN) 4 MG tablet TK 1 T PO D PRN  07/19/18   [provider]  oxyCODONE-acetaminophen (PERCOCET) 5-325 MG tablet Take 1 tablet by mouth every 6 (six) hours as needed for severe pain. 01/09/19   Rhyne, Samantha J, PA-C  VICTOZA 18 MG/3ML SOPN 12 mg at bedtime.  08/16/17   [provider]    Family History History reviewed. No pertinent family history.  Social History Social History   Tobacco Use  . Smoking status: Former Research scientist (life sciences)  . Smokeless tobacco: Never Used  Substance Use Topics  . Alcohol use: Not Currently    Comment: heavy drinker in the past, none since 07/19/18  . Drug use: Not Currently     Allergies   Patient has no known allergies.   Review  of Systems Review of Systems  Unable to perform ROS: Mental status change     Physical Exam Updated Vital Signs BP (!) 151/111   Pulse 82   Temp 97.8 F (36.6 C) (Oral)   Resp 11   SpO2 97%   Physical Exam Vitals signs and nursing note reviewed.  Constitutional:      Appearance: He is obese. He is ill-appearing.  HENT:     Head: Normocephalic and atraumatic.     Right Ear: External ear normal.     Left Ear: External ear normal.     Nose: Nose normal.     Mouth/Throat:     Mouth: Mucous membranes are moist.     Comments: Dried saliva vs debris of dried emesis seen in facial hair surrounding his mouth. Eyes:     General: No scleral icterus.    Pupils: Pupils are equal, round, and reactive to light.  Neck:     Musculoskeletal: No neck rigidity or injury.  Cardiovascular:     Rate and Rhythm: Normal rate and regular rhythm.     Pulses: Normal pulses.  Pulmonary:     Effort: No accessory muscle usage.     Breath sounds: Normal air entry. Decreased breath sounds present.     Comments: Diminished lung sounds throughout, likely due to low tidal volume respirations, though no obvious rhonchi, wheezes or rales.  Genitourinary:    Penis: Uncircumcised.      Scrotum/Testes:        Right: Swelling present.        Left: Swelling present.     Comments: Significantly enlarged/edematous scrotum bilaterally.  Uncircumcised penis with very small, approximately 1 cm opening at the tip of the foreskin that is unable to be retracted over the glans penis. Musculoskeletal:     Comments: Left BKA  Skin:    General: Skin is warm and dry.     Comments: RLE with dry skin, flaky skin of ankle and foot. DP 1+ RLE      ED Treatments / Results  Labs (all labs ordered are listed, but only abnormal results are displayed) Labs Reviewed  BASIC METABOLIC PANEL - Abnormal; Notable for the following components:      Result Value   Potassium 6.4 (*)    CO2 9 (*)    BUN 154 (*)    Creatinine,  Ser 19.78 (*)    Calcium 6.9 (*)    GFR calc non Af Amer 2 (*)    GFR calc Af Amer 3 (*)    Anion gap 18 (*)    All other components within normal limits  CBC - Abnormal; Notable for the following components:   WBC 14.6 (*)    RBC 2.74 (*)    Hemoglobin  8.3 (*)    HCT 28.4 (*)    MCV 103.6 (*)    MCHC 29.2 (*)    All other components within normal limits  HEPATIC FUNCTION PANEL - Abnormal; Notable for the following components:   Albumin 2.0 (*)    All other components within normal limits  IRON AND TIBC - Abnormal; Notable for the following components:   TIBC 119 (*)    Saturation Ratios 55 (*)    All other components within normal limits  PROTIME-INR - Abnormal; Notable for the following components:   Prothrombin Time 16.7 (*)    INR 1.4 (*)    All other components within normal limits  COMPREHENSIVE METABOLIC PANEL - Abnormal; Notable for the following components:   Potassium 5.9 (*)    CO2 15 (*)    Glucose, Bld 209 (*)    BUN 152 (*)    Creatinine, Ser 19.14 (*)    Calcium 6.7 (*)    Total Protein 6.3 (*)    Albumin 1.9 (*)    GFR calc non Af Amer 3 (*)    GFR calc Af Amer 3 (*)    Anion gap 17 (*)    All other components within normal limits  CBC WITH DIFFERENTIAL/PLATELET - Abnormal; Notable for the following components:   WBC 14.3 (*)    RBC 2.55 (*)    Hemoglobin 7.8 (*)    HCT 24.7 (*)    Neutro Abs 11.8 (*)    Monocytes Absolute 1.2 (*)    Abs Immature Granulocytes 0.40 (*)    All other components within normal limits  GLUCOSE, CAPILLARY - Abnormal; Notable for the following components:   Glucose-Capillary 101 (*)    All other components within normal limits  PHOSPHORUS - Abnormal; Notable for the following components:   Phosphorus >30.0 (*)    All other components within normal limits  GLUCOSE, CAPILLARY - Abnormal; Notable for the following components:   Glucose-Capillary 187 (*)    All other components within normal limits  BLOOD GAS, ARTERIAL -  Abnormal; Notable for the following components:   pH, Arterial 7.221 (*)    pO2, Arterial 80.0 (*)    Bicarbonate 13.8 (*)    Acid-base deficit 12.4 (*)    All other components within normal limits  CBC - Abnormal; Notable for the following components:   WBC 14.8 (*)    RBC 2.60 (*)    Hemoglobin 7.9 (*)    HCT 25.5 (*)    All other components within normal limits  CBG MONITORING, ED - Abnormal; Notable for the following components:   Glucose-Capillary 66 (*)    All other components within normal limits  POCT I-STAT EG7 - Abnormal; Notable for the following components:   pH, Ven 7.196 (*)    pCO2, Ven 23.0 (*)    pO2, Ven 125.0 (*)    Bicarbonate 8.9 (*)    TCO2 10 (*)    Acid-base deficit 18.0 (*)    Potassium 6.3 (*)    Calcium, Ion 0.87 (*)    HCT 25.0 (*)    Hemoglobin 8.5 (*)    All other components within normal limits  CBG MONITORING, ED - Abnormal; Notable for the following components:   Glucose-Capillary 64 (*)    All other components within normal limits  CBG MONITORING, ED - Abnormal; Notable for the following components:   Glucose-Capillary 152 (*)    All other components within normal limits  POCT I-STAT 7, (LYTES,  BLD GAS, ICA,H+H) - Abnormal; Notable for the following components:   pH, Arterial 7.148 (*)    pCO2 arterial 31.0 (*)    Bicarbonate 10.7 (*)    TCO2 12 (*)    Acid-base deficit 17.0 (*)    Potassium 6.1 (*)    Calcium, Ion 1.01 (*)    HCT 24.0 (*)    Hemoglobin 8.2 (*)    All other components within normal limits  CBG MONITORING, ED - Abnormal; Notable for the following components:   Glucose-Capillary 103 (*)    All other components within normal limits  SARS CORONAVIRUS 2 (HOSPITAL ORDER, [facility])  MRSA PCR SCREENING  AMMONIA  LACTIC ACID, PLASMA  LACTIC ACID, PLASMA  FERRITIN  HIV ANTIBODY (ROUTINE TESTING W REFLEX)  GLUCOSE, CAPILLARY  GLUCOSE, CAPILLARY  GLUCOSE, CAPILLARY  GLUCOSE, CAPILLARY   HEMOGLOBIN A1C  GLUCOSE, CAPILLARY  URINALYSIS, ROUTINE W REFLEX MICROSCOPIC  HEPATITIS B SURFACE ANTIGEN  HEPATITIS B CORE ANTIBODY, TOTAL  HEPATITIS B SURFACE ANTIBODY,QUALITATIVE  RENAL FUNCTION PANEL  CBG MONITORING, ED  CBG MONITORING, ED  I-STAT VENOUS BLOOD GAS, ED  CBG MONITORING, ED    EKG EKG Interpretation  Date/Time:  Monday January 30 2019 17:06:25 EDT Ventricular Rate:  83 PR Interval:    QRS Duration: 119 QT Interval:  433 QTC Calculation: 509 R Axis:   102 Text Interpretation:  Sinus rhythm Nonspecific intraventricular conduction delay Low voltage, extremity and precordial leads Nonspecific T wave abnormality Confirmed by Lajean Saver [phone]) on 01/30/2019 6:25:51 PM   Radiology US Renal  Result Date: 01/31/2019 CLINICAL DATA:  Acute renal failure. History of diabetes, hypertension, chronic kidney disease. EXAM: RENAL / URINARY TRACT ULTRASOUND COMPLETE COMPARISON:  None. FINDINGS: Right Kidney: Renal measurements: 10.7 x 6.2 x 6.2 cm = volume: 214 mL. Suboptimal visualization. Echogenicity within normal limits. No mass or hydronephrosis visualized. Left Kidney: Renal measurements: 11.7 x 7.3 x 5.8 cm = volume: 256 mL. Suboptimal visualization. Echogenicity within normal limits. No mass or hydronephrosis visualized. Bladder: Appears normal for degree of bladder distention. IMPRESSION: Suboptimal visualization of both kidneys. No gross abnormality. No hydronephrosis. Electronically Signed   By: Franki Cabot M.D.   On: 01/31/2019 15:59   Ir Fluoro Guide Cv Line Right  Result Date: 01/31/2019 CLINICAL DATA:  Renal failure, needs hemodialysis access. Elevated white blood cell count of indeterminate significance. EXAM: EXAM RIGHT IJ CATHETER PLACEMENT UNDER ULTRASOUND AND FLUOROSCOPIC GUIDANCE TECHNIQUE: The procedure, risks (including but not limited to bleeding, infection, organ damage, pneumothorax), benefits, and alternatives were explained to the family. Questions  regarding the procedure were encouraged and answered. The family understands and consents to the procedure. Patency of the right IJ vein was confirmed with ultrasound with image documentation. An appropriate skin site was determined. Skin site was marked. Region was prepped using maximum barrier technique including cap and mask, sterile gown, sterile gloves, large sterile sheet, and Chlorhexidine as cutaneous antisepsis. The region was infiltrated locally with 1% lidocaine. Under real-time ultrasound guidance, the right IJ vein was accessed with a 21 gauge needle; the needle tip within the vein was confirmed with ultrasound image documentation. The needle exchanged over a 018 guidewire for vascular dilator which allowed advancement of a 16 cm Mahurkar catheter. This was positioned with the tip at the cavoatrial junction. Spot chest radiograph shows good positioning and no pneumothorax. Catheter was flushed and sutured externally with 0-Prolene sutures. Patient tolerated the procedure well. FLUOROSCOPY TIME:  Less than 6 seconds;  10 uGym2 DAP COMPLICATIONS: COMPLICATIONS none IMPRESSION: 1. Technically successful right IJ Mahurkar catheter placement. Electronically Signed   By: Lucrezia Europe M.D.   On: 01/31/2019 12:33   Ir US Guide Vasc Access Right  Result Date: 01/31/2019 CLINICAL DATA:  Renal failure, needs hemodialysis access. Elevated white blood cell count of indeterminate significance.  EXAM: EXAM RIGHT IJ CATHETER PLACEMENT UNDER ULTRASOUND AND FLUOROSCOPIC GUIDANCE  TECHNIQUE: The procedure, risks (including but not limited to bleeding, infection, organ damage, pneumothorax), benefits, and alternatives were explained to the family. Questions regarding the procedure were encouraged and answered. The family understands and consents to the procedure. Patency of the right IJ vein was confirmed with ultrasound with image documentation. An appropriate skin site was determined. Skin site was marked. Region was  prepped using maximum barrier technique including cap and mask, sterile gown, sterile gloves, large sterile sheet, and Chlorhexidine as cutaneous antisepsis. The region was infiltrated locally with 1% lidocaine. Under real-time ultrasound guidance, the right IJ vein was accessed with a 21 gauge needle; the needle tip within the vein was confirmed with ultrasound image documentation. The needle exchanged over a 018 guidewire for vascular dilator which allowed advancement of a 16 cm Mahurkar catheter. This was positioned with the tip at the cavoatrial junction. Spot chest radiograph shows good positioning and no pneumothorax. Catheter was flushed and sutured externally with 0-Prolene sutures. Patient tolerated the procedure well.  FLUOROSCOPY TIME:  Less than 6 seconds; 10 uGym2 DAP  COMPLICATIONS: COMPLICATIONS none  IMPRESSION: 1. Technically successful right IJ Mahurkar catheter placement.   Electronically Signed   By: Lucrezia Europe M.D.   On: 01/31/2019 12:33  Dg Chest Port 1 View  Result Date: 01/30/2019 CLINICAL DATA:  Weakness EXAM: PORTABLE CHEST 1 VIEW COMPARISON:  December 30, 2018 FINDINGS: Cardiomegaly with vascular congestion. Mild perihilar and bibasilar opacities concerning for early edema. No effusions. No acute bony abnormality. IMPRESSION: Cardiomegaly with vascular congestion and possible early perihilar edema. Electronically Signed   By: Rolm Baptise M.D.   On: 01/30/2019 18:50   US Scrotum W/doppler  Result Date: 01/31/2019 CLINICAL DATA:  48 year old male with pain and swelling. EXAM: SCROTAL ULTRASOUND DOPPLER ULTRASOUND OF THE TESTICLES TECHNIQUE: Complete ultrasound examination of the testicles, epididymis, and other scrotal structures was performed. Color and spectral Doppler ultrasound were also utilized to evaluate blood flow to the testicles. COMPARISON:  CT Abdomen and Pelvis 07/31/2017. FINDINGS: Right testicle Measurements: 3.0 x 2.1 x 1.9 centimeters. No mass or microlithiasis  visualized. Left testicle Measurements: 2.8 x 1.9 x 2.2 centimeters. No mass or microlithiasis visualized. Right epididymis:  Normal in size and appearance. Left epididymis:  Normal in size and appearance. Hydrocele:  None visualized. Varicocele:  None visualized. Pulsed Doppler interrogation of both testes demonstrates normal low resistance arterial and venous waveforms bilaterally. Other findings: Generalized scrotal soft tissue thickening or edema. No ultrasound evidence of soft tissue gas. IMPRESSION: No evidence of testicular torsion, and negative aside from generalized scrotal soft tissue thickening/edema. Electronically Signed   By: Genevie Ann M.D.   On: 01/31/2019 00:34    Procedures Procedures (including critical care time)  Medications Ordered in ED Medications  sodium zirconium cyclosilicate (LOKELMA) packet 10 g (has no administration in time range)  sodium bicarbonate 150 mEq in dextrose 5 % 1,000 mL infusion ( Intravenous New Bag/Given 01/31/19 1439)  dextrose 50 % solution 25 mL (25 mLs Intravenous Given 01/30/19 1817)  calcium gluconate 1 g in sodium chloride 0.9 % 100 mL IVPB (  0 g Intravenous Stopped 01/30/19 2224)  sodium bicarbonate injection 50 mEq (50 mEq Intravenous Given 01/30/19 2120)  dextrose 50 % solution 25 mL (25 mLs Intravenous Given 01/30/19 2325)   Of note, this patient was evaluated in the Emergency Department for the symptoms described in the history of present illness. He was evaluated in the context of the global COVID-19 pandemic, which necessitated consideration that the patient might be at risk for infection with the SARS-CoV-2 virus that causes COVID-19. Institutional protocols and algorithms that pertain to the evaluation of patients at risk for COVID-19 are in a state of rapid change based on information released by regulatory bodies including the CDC and federal and state organizations. These policies and algorithms were followed during the patient's care in the ED.   During this patient encounter, the patient was wearing a mask, and throughout this encounter I was wearing at least a surgical mask.  I was not within 6 feet of this patient for more than 15 minutes without eye protection when they were not wearing mask.    Initial Impression / Assessment and Plan / ED Course  I have reviewed the triage vital signs and the nursing notes.  Pertinent labs & imaging results that were available during my care of the patient were reviewed by me and considered in my medical decision making (see chart for details).     Differentials considered: Uremic encephalopathy, infectious encephalopathy, acute renal failure, hypoglycemia, SIRS, acute urinary retention  EM Physician interpretation of Labs & Imaging: . Leukocytosis is present with HC 14.6 . Chronic anemia is present . With Hgb 8.3, HCT 28.4 . BMP with hyperkalemia with serum potassium 6.4 without hemolysis. . Decreased serum bicarb at 9 . Significance AKI on CKD with BUN 154 and serum creatinine 19.7 . Elevated anion gap of 18 . Normal serum ammonia, not elevated . I-STAT venous blood gas with pH 7.19, PCO2 is 23, PO2 125, bicarb 8.9 . I-STAT arterial blood gas with pH 7.14, PCO2 31, PO2 87, bicarb 10.7 and potassium was 6.1 . Single view portable chest x-ray with cardiomegaly and poor lung volume with possible interstitial edema, see radiology interpretation for further  Medical Decision Making:  Michael Riggs is a 48 y.o. male with the above past medical history significant for CKD stage V, uncontrolled diabetes, HTN who presented to the emergency department today via EMS from home for 2 days of progressive lethargy and confusion.  GCS 13-14 on arrival to this facility.  He was afebrile and hemodynamically stable.  He continuously would fall asleep easily and in the middle of conversation, having to be managed or encouraged to wake up to answer questions.  Laboratory and imaging work-up shows  evidence of significant progression of the patient's underlying chronic kidney disease necessitating urgent initiation of hemodialysis.  He had hyperkalemia with a serum potassium of 6.4 without hemolysis.  He was given calcium gluconate, we did not give insulin and dextrose as he was actually hypoglycemic and had to be given several pushes of dextrose in the emergency department.  I contacted the on-call nephrology specialist who requested starting Lokelma, giving calcium as discussed and possibly bicarb, they will plan on starting hemodialysis in the morning after IR can establish permacath or vascular access for dialysis.  Nursing attempted to place a foley catheter to obtain urine sample, however, given the combination of his significantly edematous scrotum and edematous penile tissue with the fold and unable to be retracted over the penis they were  unable to place this.  I attempted to place a urinary catheter as well at the bedside and found that the foreskin was actually attached along the anterior aspect at the tip of the glans penis, I suspect a hypospadia's urethral opening, though was unable to identify its exact location to place a urethral catheter.  He will need urology consult for assistance with this and patient.  His state of lethargy and confusion at this time is most likely due to uremic encephalopathy as he has a wide anion gap metabolic acidosis with hyperkalemia and BUN >150, which has nearly tripled since his last labs one month ago.   While he is lethargic, he is easily arousable and answers some questions appropriately when aroused, he is maintaining his own airway at this time and does not require intubation.  Additionally, at this time he is hemodynamically stable.  Tract hospitalist was contacted for admission of this patient admission orders and recommendations were placed.  The plan for this patient was discussed with my attending physician, Dr. Lajean Saver, who voiced  agreement and who oversaw evaluation and treatment of this patient.    CLINICAL IMPRESSION: 1. ESRD (end stage renal disease) ([place])   2. Pain   3. Acute renal failure ([place])   4. Decreased urine output      Disposition: Admit   Syndey Jaskolski A. Jimmye Norman, MD Resident Physician, PGY-3 Emergency Medicine [facility]    Jefm Petty, MD 01/31/19 1731    Lajean Saver, MD 01/31/19 2352

## 2019-01-30 NOTE — ED Notes (Signed)
ED TO INPATIENT HANDOFF REPORT  ED Nurse Name and Phone #:  Damiel Barthold [phone]  S Name/Age/Gender Michael Riggs 48 y.o. male Room/Bed: TRAAC/TRAAC  Code Status   Code Status: Not on file  Home/SNF/Other Home Patient oriented to: self Is this baseline? No   Triage Complete: Triage complete  Chief Complaint Lethargic  Triage Note Per [facility], pt from home with complaint of lethargy x 2 days. Pt had fistula placed in left arm recently in preparation for dialysis next week. Pt has bite mark on side of his tongue, denies seizure or hx. Also has dried blood to nose from recent nosebleed, but no active bleeding now. Family states "he just hasn't been acting right for the past 2 days" Pt has left BKA.    Allergies No Known Allergies  Level of Care/Admitting Diagnosis ED Disposition    ED Disposition Condition Comment   Admit  Hospital Area: [facility] [[id number]]  Level of Care: Progressive [102]  Covid Evaluation: Asymptomatic Screening Protocol (No Symptoms)  Diagnosis: ARF (acute renal failure) [facility]) [703500]  Admitting Physician: Rise Patience [phone]  Attending Physician: Rise Patience [phone]  Estimated length of stay: past midnight tomorrow  Certification:: I certify this patient will need inpatient services for at least 2 midnights  PT Class (Do Not Modify): Inpatient [101]  PT Acc Code (Do Not Modify): Private [1]       B Medical/Surgery History Past Medical History:  Diagnosis Date  . Anemia   . Chronic kidney disease    Stage 5  . Depression   . Diabetes mellitus without complication ([place])   . Dyspnea   . GERD (gastroesophageal reflux disease)   . Hypertension    Past Surgical History:  Procedure Laterality Date  . AV FISTULA PLACEMENT Left 01/09/2019   Procedure: ARTERIOVENOUS (AV) FISTULA CREATION LEFT UPPER ARM;  Surgeon: [name], MD;  Location: [facility] OR;  Service: Vascular;  Laterality: Left;  . below the  knee amputation Left   . EYE SURGERY       A IV Location/Drains/Wounds Patient Lines/Drains/Airways Status   Active Line/Drains/Airways    Name:   Placement date:   Placement time:   Site:   Days:   Peripheral IV 01/09/19 Right Hand   01/09/19    0948    Hand   21   Peripheral IV 01/30/19 Right Antecubital   01/30/19    1806    Antecubital   less than 1   Fistula / Graft Left Upper arm Arteriovenous fistula   01/09/19    1256    Upper arm   21   Incision (Closed) 01/09/19 Arm Left   01/09/19    1225     21          Intake/Output Last 24 hours No intake or output data in the 24 hours ending 01/30/19 2229  Labs/Imaging Results for orders placed or performed during the hospital encounter of 01/30/19 (from the past 48 hour(s))  Basic metabolic panel     Status: Abnormal   Collection Time: 01/30/19  5:56 PM  Result Value Ref Range   Sodium 136 135 - 145 mmol/L   Potassium 6.4 (HH) 3.5 - 5.1 mmol/L    Comment: NO VISIBLE HEMOLYSIS CRITICAL RESULT CALLED TO, READ BACK BY AND VERIFIED WITH:  C BAINS,RN 1853 01/30/2019 WBOND    Chloride 109 98 - 111 mmol/L   CO2 9 (L) 22 - 32 mmol/L   Glucose,  Bld 73 70 - 99 mg/dL   BUN 154 (H) 6 - 20 mg/dL   Creatinine, Ser 19.78 (H) 0.61 - 1.24 mg/dL   Calcium 6.9 (L) 8.9 - 10.3 mg/dL   GFR calc non Af Amer 2 (L) >60 mL/min   GFR calc Af Amer 3 (L) >60 mL/min   Anion gap 18 (H) 5 - 15    Comment: Performed at [facility] [address] [place], [place] [address]  CBC     Status: Abnormal   Collection Time: 01/30/19  5:56 PM  Result Value Ref Range   WBC 14.6 (H) 4.0 - 10.5 K/uL   RBC 2.74 (L) 4.22 - 5.81 MIL/uL   Hemoglobin 8.3 (L) 13.0 - 17.0 g/dL   HCT 28.4 (L) 39.0 - 52.0 %   MCV 103.6 (H) 80.0 - 100.0 fL   MCH 30.3 26.0 - 34.0 pg   MCHC 29.2 (L) 30.0 - 36.0 g/dL   RDW 13.8 11.5 - 15.5 %   Platelets 221 150 - 400 K/uL   nRBC 0.0 0.0 - 0.2 %    Comment: Performed at [facility], [place] [address] [place], [place]  [address]  CBG monitoring, ED     Status: Abnormal   Collection Time: 01/30/19  6:03 PM  Result Value Ref Range   Glucose-Capillary 66 (L) 70 - 99 mg/dL  CBG monitoring, ED     Status: None   Collection Time: 01/30/19  6:35 PM  Result Value Ref Range   Glucose-Capillary 92 70 - 99 mg/dL  CBG monitoring, ED     Status: None   Collection Time: 01/30/19  7:31 PM  Result Value Ref Range   Glucose-Capillary 83 70 - 99 mg/dL  SARS Coronavirus 2 (CEPHEID - Performed in [place] hospital lab), Hosp Order     Status: None   Collection Time: 01/30/19  7:58 PM   Specimen: Nasopharyngeal Swab  Result Value Ref Range   SARS Coronavirus 2 NEGATIVE NEGATIVE    Comment: (NOTE) If result is NEGATIVE SARS-CoV-2 target nucleic acids are NOT DETECTED. The SARS-CoV-2 RNA is generally detectable in upper and lower  respiratory specimens during the acute phase of infection. The lowest  concentration of SARS-CoV-2 viral copies this assay can detect is 250  copies / mL. A negative result does not preclude SARS-CoV-2 infection  and should not be used as the sole basis for treatment or other  patient management decisions.  A negative result may occur with  improper specimen collection / handling, submission of specimen other  than nasopharyngeal swab, presence of viral mutation(s) within the  areas targeted by this assay, and inadequate number of viral copies  (<250 copies / mL). A negative result must be combined with clinical  observations, patient history, and epidemiological information. If result is POSITIVE SARS-CoV-2 target nucleic acids are DETECTED. The SARS-CoV-2 RNA is generally detectable in upper and lower  respiratory specimens dur ing the acute phase of infection.  Positive  results are indicative of active infection with SARS-CoV-2.  Clinical  correlation with patient history and other diagnostic information is  necessary to determine patient infection status.  Positive results do  not  rule out bacterial infection or co-infection with other viruses. If result is PRESUMPTIVE POSTIVE SARS-CoV-2 nucleic acids MAY BE PRESENT.   A presumptive positive result was obtained on the submitted specimen  and confirmed on repeat testing.  While 2019 novel coronavirus  (SARS-CoV-2) nucleic acids may be present  in the submitted sample  additional confirmatory testing may be necessary for epidemiological  and / or clinical management purposes  to differentiate between  SARS-CoV-2 and other Sarbecovirus currently known to infect humans.  If clinically indicated additional testing with an alternate test  methodology [phone]) is advised. The SARS-CoV-2 RNA is generally  detectable in upper and lower respiratory sp ecimens during the acute  phase of infection. The expected result is Negative. Fact Sheet for Patients:  StrictlyIdeas.no Fact Sheet for Healthcare Providers: BankingDealers.co.za This test is not yet approved or cleared by the [place] FDA and has been authorized for detection and/or diagnosis of SARS-CoV-2 by FDA under an Emergency Use Authorization (EUA).  This EUA will remain in effect (meaning this test can be used) for the duration of the COVID-19 declaration under Section 564(b)(1) of the Act, 21 U.S.C. section 360bbb-3(b)(1), unless the authorization is terminated or revoked sooner. Performed at [facility], [place] [address], [place], [place] [address]   Hepatic function panel     Status: Abnormal   Collection Time: 01/30/19  8:42 PM  Result Value Ref Range   Total Protein 6.8 6.5 - 8.1 g/dL   Albumin 2.0 (L) 3.5 - 5.0 g/dL   AST 17 15 - 41 U/L   ALT 10 0 - 44 U/L   Alkaline Phosphatase 68 38 - 126 U/L   Total Bilirubin 0.7 0.3 - 1.2 mg/dL   Bilirubin, Direct 0.2 0.0 - 0.2 mg/dL   Indirect Bilirubin 0.5 0.3 - 0.9 mg/dL    Comment: Performed at [facility] [address] [place], [place]  [address]  Ammonia     Status: None   Collection Time: 01/30/19  8:42 PM  Result Value Ref Range   Ammonia 28 9 - 35 umol/L    Comment: Performed at [facility], [place] [address], [place], [place] [address]  Lactic acid, plasma     Status: None   Collection Time: 01/30/19  8:44 PM  Result Value Ref Range   Lactic Acid, Venous 0.6 0.5 - 1.9 mmol/L    Comment: Performed at [facility] [address], ,  [address]  POCT I-Stat EG7     Status: Abnormal   Collection Time: 01/30/19  8:58 PM  Result Value Ref Range   pH, Ven 7.196 (LL) 7.250 - 7.430   pCO2, Ven 23.0 (L) 44.0 - 60.0 mmHg   pO2, Ven 125.0 (H) 32.0 - 45.0 mmHg   Bicarbonate 8.9 (L) 20.0 - 28.0 mmol/L   TCO2 10 (L) 22 - 32 mmol/L   O2 Saturation 98.0 %   Acid-base deficit 18.0 (H) 0.0 - 2.0 mmol/L   Sodium 136 135 - 145 mmol/L   Potassium 6.3 (HH) 3.5 - 5.1 mmol/L   Calcium, Ion 0.87 (LL) 1.15 - 1.40 mmol/L   HCT 25.0 (L) 39.0 - 52.0 %   Hemoglobin 8.5 (L) 13.0 - 17.0 g/dL   Patient temperature HIDE    Sample type VENOUS    Comment NOTIFIED PHYSICIAN   CBG monitoring, ED     Status: None   Collection Time: 01/30/19  9:21 PM  Result Value Ref Range   Glucose-Capillary 76 70 - 99 mg/dL   Dg Chest Port 1 View  Result Date: 01/30/2019 CLINICAL DATA:  Weakness EXAM: PORTABLE CHEST 1 VIEW COMPARISON:  December 30, 2018 FINDINGS: Cardiomegaly with vascular congestion. Mild perihilar and bibasilar opacities concerning for early edema. No effusions. No acute  bony abnormality. IMPRESSION: Cardiomegaly with vascular congestion and possible early perihilar edema. Electronically Signed   By: Rolm Baptise M.D.   On: 01/30/2019 18:50    Pending Labs Unresulted Labs (From admission, onward)    Start     Ordered   01/31/19 0500  Renal function panel  Tomorrow morning,   R     01/30/19 2156   01/31/19 0500  CBC  Tomorrow morning,   R     01/30/19 2156   01/31/19 0500  Ferritin  Tomorrow morning,   R     01/30/19  2156   01/31/19 0500  Iron and TIBC  Tomorrow morning,   R     01/30/19 2156   01/31/19 0500  Protime-INR  Tomorrow morning,   R     01/30/19 2157   01/30/19 2044  Lactic acid, plasma  Now then every 2 hours,   STAT     01/30/19 2043   01/30/19 1756  Urinalysis, Routine w reflex microscopic  Once,   STAT     01/30/19 1756   Signed and Held  Hepatitis B surface antigen  (New Patient Hemo Labs (Hepatitis B))  Once,   R     Signed and Held   Signed and Held  Hepatitis B surface antibody  (New Patient [facility] (Hepatitis B))  Once,   R     Signed and Held   Signed and Held  Hepatitis B core antibody, total  (New Patient [facility] (Hepatitis B))  Once,   R     Signed and Held          Vitals/Pain Today's Vitals   01/30/19 1930 01/30/19 2030 01/30/19 2136 01/30/19 2145  BP:   (!) 133/104 (!) 139/97  Pulse:   80 81  Resp: 11 13 12 10   Temp:      TempSrc:      SpO2:   98% 95%  PainSc:        Isolation Precautions No active isolations  Medications Medications  sodium zirconium cyclosilicate (LOKELMA) packet 10 g (has no administration in time range)  amLODipine (NORVASC) tablet 10 mg (has no administration in time range)  sodium bicarbonate 150 mEq in dextrose 5 % 1,000 mL infusion (has no administration in time range)  Chlorhexidine Gluconate Cloth 2 % PADS 6 each (has no administration in time range)  sodium zirconium cyclosilicate (LOKELMA) packet 10 g (has no administration in time range)  Darbepoetin Alfa (ARANESP) injection 60 mcg (has no administration in time range)  dextrose 50 % solution 25 mL (25 mLs Intravenous Given 01/30/19 1817)  calcium gluconate 1 g in sodium chloride 0.9 % 100 mL IVPB (1 g Intravenous New Bag/Given 01/30/19 2124)  sodium bicarbonate injection 50 mEq (50 mEq Intravenous Given 01/30/19 2120)    Mobility manual wheelchair High fall risk   Focused Assessments Neuro Assessment Handoff:  Swallow screen pass? Yes  Cardiac Rhythm: Normal sinus  rhythm       Neuro Assessment:   Neuro Checks:      Last Documented NIHSS Modified Score:   Has TPA been given? No If patient is a Neuro Trauma and patient is going to OR before floor call report to [facility] nurse: [phone] or [phone]     R Recommendations: See Admitting Provider Note  Report given to:   Additional Notes: pt extremely somnolent

## 2019-01-30 NOTE — Consult Note (Addendum)
[name]  Reason for Consultation: uremia, hyperkalemia Requesting Provider: Dr. Darvin Neighbours  HPI: Michael Riggs is an 48 y.o. male with obesity, DM, HTN, CKD, recent + RPR and reactive TP-PA, PVD s/p L BKA who is seen for evaluation and management of uremia.   06/2018 Cr 2.9, in setting of stump infection and hypoglycemia labs at PCPs end of 11/2018 showed Cr 5.7 prompting urgent nephrology referral and he est care with Dr. Hollie Salk at [facility] early 12/2018 at which time Cr 8.9.  Sediment with some acanthocytes, granular casts, OFB, WBCs.  Up/C ~22.  Serologic eval pursued.  ANA + 1:80, ANCA PR3 + 7.1. RPR reactive 1:1.    Referred for VVS access placement and had 1st stage BB AVF LUE on 01/09/19.  It appears per chart multiple attempts were made to f/u (no shows and cancelled appts) and arrange renal biopsy for patient but he did not follow up in clinic.    He presented to the ED today with confusion.  Labs show K 6.4 (no EKG changes), Bicarb 9, BUN 154, Cr 19.8, Hb 8.3.  Hypertensive to 165, O2 sat 98% RA.  Hypoglycemic requiring dextrose administration.  CXR with early congestion.  C/o inability to urinate currently, timeline unclear.  No f/c.    He recently had an appt at [facility] for treatment of +RPR.  He says he went but cannot tell me any details.   Says lives with mom but cannot give me further details about ADLs, etc.    PMH: Past Medical History:  Diagnosis Date  . Anemia   . Chronic kidney disease    Stage 5  . Depression   . Diabetes mellitus without complication ([place])   . Dyspnea   . GERD (gastroesophageal reflux disease)   . Hypertension    PSH: Past Surgical History:  Procedure Laterality Date  . AV FISTULA PLACEMENT Left 01/09/2019   Procedure: ARTERIOVENOUS (AV) FISTULA CREATION LEFT UPPER ARM;  Surgeon: [name], MD;  Location: [facility] OR;  Service: Vascular;  Laterality: Left;  . below the knee amputation Left    . EYE SURGERY      Past Medical History:  Diagnosis Date  . Anemia   . Chronic kidney disease    Stage 5  . Depression   . Diabetes mellitus without complication ([place])   . Dyspnea   . GERD (gastroesophageal reflux disease)   . Hypertension     Medications:  I have reviewed the patient's current medications.  ALLERGIES:  No Known Allergies  FAM HX: History reviewed. No pertinent family history.  Social History:   reports that he has quit smoking. He has never used smokeless tobacco. He reports previous alcohol use. He reports previous drug use.  ROS: 12 system ROS negative except per HPI  Blood pressure (!) 151/111, pulse 82, temperature 97.8 F (36.6 C), temperature source Oral, resp. rate 13, SpO2 97 %. PHYSICAL EXAM: Gen: obese man lying flat on stretcher Eyes: anicteric ENT: MM dry Neck: supple, thick CV:  RRR, no rub Abd: soft, obese Lungs: clear ant and lat, normal WOB GU: no foley - bladder scan 598mL, scrotal edema Extr: trace LE edema, L BKA with what appears to be healing lesion; LUE AVF t/b+ Neuro: somewhat slurred speech, difficulty with concentration. Cannot cooperate enough to check for asterixis.    Results for orders placed or performed during the hospital encounter of 01/30/19 (from the past 48 hour(s))  Basic  metabolic panel     Status: Abnormal   Collection Time: 01/30/19  5:56 PM  Result Value Ref Range   Sodium 136 135 - 145 mmol/L   Potassium 6.4 (HH) 3.5 - 5.1 mmol/L    Comment: NO VISIBLE HEMOLYSIS CRITICAL RESULT CALLED TO, READ BACK BY AND VERIFIED WITH:  C BAINS,RN 1853 01/30/2019 WBOND    Chloride 109 98 - 111 mmol/L   CO2 9 (L) 22 - 32 mmol/L   Glucose, Bld 73 70 - 99 mg/dL   BUN 154 (H) 6 - 20 mg/dL   Creatinine, Ser 19.78 (H) 0.61 - 1.24 mg/dL   Calcium 6.9 (L) 8.9 - 10.3 mg/dL   GFR calc non Af Amer 2 (L) >60 mL/min   GFR calc Af Amer 3 (L) >60 mL/min   Anion gap 18 (H) 5 - 15    Comment: Performed at [facility] [address], [place], [place] [address]  CBC     Status: Abnormal   Collection Time: 01/30/19  5:56 PM  Result Value Ref Range   WBC 14.6 (H) 4.0 - 10.5 K/uL   RBC 2.74 (L) 4.22 - 5.81 MIL/uL   Hemoglobin 8.3 (L) 13.0 - 17.0 g/dL   HCT 28.4 (L) 39.0 - 52.0 %   MCV 103.6 (H) 80.0 - 100.0 fL   MCH 30.3 26.0 - 34.0 pg   MCHC 29.2 (L) 30.0 - 36.0 g/dL   RDW 13.8 11.5 - 15.5 %   Platelets 221 150 - 400 K/uL   nRBC 0.0 0.0 - 0.2 %    Comment: Performed at [facility], [place] [address], [place], [place] [address]  CBG monitoring, ED     Status: Abnormal   Collection Time: 01/30/19  6:03 PM  Result Value Ref Range   Glucose-Capillary 66 (L) 70 - 99 mg/dL  CBG monitoring, ED     Status: None   Collection Time: 01/30/19  6:35 PM  Result Value Ref Range   Glucose-Capillary 92 70 - 99 mg/dL  CBG monitoring, ED     Status: None   Collection Time: 01/30/19  7:31 PM  Result Value Ref Range   Glucose-Capillary 83 70 - 99 mg/dL    Dg Chest Port 1 View  Result Date: 01/30/2019 CLINICAL DATA:  Weakness EXAM: PORTABLE CHEST 1 VIEW COMPARISON:  December 30, 2018 FINDINGS: Cardiomegaly with vascular congestion. Mild perihilar and bibasilar opacities concerning for early edema. No effusions. No acute bony abnormality. IMPRESSION: Cardiomegaly with vascular congestion and possible early perihilar edema. Electronically Signed   By: Rolm Baptise M.D.   On: 01/30/2019 18:50    Assessment/Plan **AMS secondary to uremia:  Will need to start HD ASAP.  AVF not mature for use so will need TDC placed; NPOpMN in prep.  Plan HD #1 tomorrow after catheter placement, #2 Wednesday.  I suspect this is most likely diabetic nephropathy and is ESRD at this point and I don't feel compelled to biopsy however will d/w primary nephrologist as well.   Start CLIP tomorrow.   **Hyperkalemia:  K 6.4 without EKG changes.  S/p IV calcium, ED planning to give dextrose and insulin when BG improved.  Give lokelma 10 TID for now,  1st dose now.    **AGMA:  Serum bicarb 9, ABG with 7.19 / 23.  Secondary to renal failure.  S/p 1 amp bicarb.  Bicarb gtt overnight 75/hr, start after IV calcium given.   **Anemia:  Hb 8.3, iron replete  with T sat 28% in 12/2018.  In light of falling Hb repeat iron studies.  Initiate ESA while admitted - 45mcg qTues.   **DM:  H/o poor control but in setting of worsening renal function recent A1cs have been ok.  Hypoglycemic on presentation.  Per primary.    **HTN:  Moderately hypertensive here.  Home med amlodipine 10.  Continue.    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  Will need to track down health dept records tomorrow.  Unclear to me that he truly went to visit.   **urinary retention:  Unable to void and bladder scan 562mL; per RN patient refused foley earlier.  Discussed and he's agreeable - place foley.  Justin Mend 01/30/2019, 8:47 PM

## 2019-01-31 ENCOUNTER — Inpatient Hospital Stay (HOSPITAL_COMMUNITY): Payer: Medicare Other

## 2019-01-31 ENCOUNTER — Encounter (HOSPITAL_COMMUNITY): Payer: Self-pay | Admitting: Interventional Radiology

## 2019-01-31 DIAGNOSIS — A539 Syphilis, unspecified: Secondary | ICD-10-CM

## 2019-01-31 DIAGNOSIS — E1169 Type 2 diabetes mellitus with other specified complication: Secondary | ICD-10-CM

## 2019-01-31 DIAGNOSIS — E872 Acidosis: Secondary | ICD-10-CM

## 2019-01-31 DIAGNOSIS — E669 Obesity, unspecified: Secondary | ICD-10-CM

## 2019-01-31 DIAGNOSIS — D539 Nutritional anemia, unspecified: Secondary | ICD-10-CM

## 2019-01-31 DIAGNOSIS — E876 Hypokalemia: Secondary | ICD-10-CM

## 2019-01-31 HISTORY — PX: IR US GUIDE VASC ACCESS RIGHT: IMG2390

## 2019-01-31 HISTORY — PX: IR FLUORO GUIDE CV LINE RIGHT: IMG2283

## 2019-01-31 LAB — BLOOD GAS, ARTERIAL
Acid-base deficit: 12.4 mmol/L — ABNORMAL HIGH (ref 0.0–2.0)
Bicarbonate: 13.8 mmol/L — ABNORMAL LOW (ref 20.0–28.0)
Drawn by: 519031
FIO2: 21
O2 Saturation: 93.8 %
Patient temperature: 98.3
pCO2 arterial: 34.9 mmHg (ref 32.0–48.0)
pH, Arterial: 7.221 — ABNORMAL LOW (ref 7.350–7.450)
pO2, Arterial: 80 mmHg — ABNORMAL LOW (ref 83.0–108.0)

## 2019-01-31 LAB — CBC
HCT: 25.5 % — ABNORMAL LOW (ref 39.0–52.0)
Hemoglobin: 7.9 g/dL — ABNORMAL LOW (ref 13.0–17.0)
MCH: 30.4 pg (ref 26.0–34.0)
MCHC: 31 g/dL (ref 30.0–36.0)
MCV: 98.1 fL (ref 80.0–100.0)
Platelets: 219 10*3/uL (ref 150–400)
RBC: 2.6 MIL/uL — ABNORMAL LOW (ref 4.22–5.81)
RDW: 13.7 % (ref 11.5–15.5)
WBC: 14.8 10*3/uL — ABNORMAL HIGH (ref 4.0–10.5)
nRBC: 0 % (ref 0.0–0.2)

## 2019-01-31 LAB — COMPREHENSIVE METABOLIC PANEL
ALT: 11 U/L (ref 0–44)
AST: 16 U/L (ref 15–41)
Albumin: 1.9 g/dL — ABNORMAL LOW (ref 3.5–5.0)
Alkaline Phosphatase: 67 U/L (ref 38–126)
Anion gap: 17 — ABNORMAL HIGH (ref 5–15)
BUN: 152 mg/dL — ABNORMAL HIGH (ref 6–20)
CO2: 15 mmol/L — ABNORMAL LOW (ref 22–32)
Calcium: 6.7 mg/dL — ABNORMAL LOW (ref 8.9–10.3)
Chloride: 105 mmol/L (ref 98–111)
Creatinine, Ser: 19.14 mg/dL — ABNORMAL HIGH (ref 0.61–1.24)
GFR calc Af Amer: 3 mL/min — ABNORMAL LOW (ref >60)
GFR calc non Af Amer: 3 mL/min — ABNORMAL LOW (ref >60)
Glucose, Bld: 209 mg/dL — ABNORMAL HIGH (ref 70–99)
Potassium: 5.9 mmol/L — ABNORMAL HIGH (ref 3.5–5.1)
Sodium: 137 mmol/L (ref 135–145)
Total Bilirubin: 0.7 mg/dL (ref 0.3–1.2)
Total Protein: 6.3 g/dL — ABNORMAL LOW (ref 6.5–8.1)

## 2019-01-31 LAB — HEMOGLOBIN A1C
Hgb A1c MFr Bld: 5 % (ref 4.8–5.6)
Mean Plasma Glucose: 96.8 mg/dL

## 2019-01-31 LAB — CBC WITH DIFFERENTIAL/PLATELET
Abs Immature Granulocytes: 0.4 10*3/uL — ABNORMAL HIGH (ref 0.00–0.07)
Basophils Absolute: 0.1 10*3/uL (ref 0.0–0.1)
Basophils Relative: 0 %
Eosinophils Absolute: 0.2 10*3/uL (ref 0.0–0.5)
Eosinophils Relative: 2 %
HCT: 24.7 % — ABNORMAL LOW (ref 39.0–52.0)
Hemoglobin: 7.8 g/dL — ABNORMAL LOW (ref 13.0–17.0)
Immature Granulocytes: 3 %
Lymphocytes Relative: 5 %
Lymphs Abs: 0.7 10*3/uL (ref 0.7–4.0)
MCH: 30.6 pg (ref 26.0–34.0)
MCHC: 31.6 g/dL (ref 30.0–36.0)
MCV: 96.9 fL (ref 80.0–100.0)
Monocytes Absolute: 1.2 10*3/uL — ABNORMAL HIGH (ref 0.1–1.0)
Monocytes Relative: 8 %
Neutro Abs: 11.8 10*3/uL — ABNORMAL HIGH (ref 1.7–7.7)
Neutrophils Relative %: 82 %
Platelets: 218 10*3/uL (ref 150–400)
RBC: 2.55 MIL/uL — ABNORMAL LOW (ref 4.22–5.81)
RDW: 13.6 % (ref 11.5–15.5)
WBC: 14.3 10*3/uL — ABNORMAL HIGH (ref 4.0–10.5)
nRBC: 0 % (ref 0.0–0.2)

## 2019-01-31 LAB — FERRITIN: Ferritin: 284 ng/mL (ref 24–336)

## 2019-01-31 LAB — GLUCOSE, CAPILLARY
Glucose-Capillary: 101 mg/dL — ABNORMAL HIGH (ref 70–99)
Glucose-Capillary: 187 mg/dL — ABNORMAL HIGH (ref 70–99)
Glucose-Capillary: 89 mg/dL (ref 70–99)
Glucose-Capillary: 96 mg/dL (ref 70–99)
Glucose-Capillary: 93 mg/dL (ref 70–99)
Glucose-Capillary: 89 mg/dL (ref 70–99)
Glucose-Capillary: 79 mg/dL (ref 70–99)
Glucose-Capillary: 79 mg/dL (ref 70–99)
Glucose-Capillary: 84 mg/dL (ref 70–99)

## 2019-01-31 LAB — URINALYSIS, ROUTINE W REFLEX MICROSCOPIC
Bilirubin Urine: NEGATIVE
Glucose, UA: 50 mg/dL — AB
Ketones, ur: NEGATIVE mg/dL
Leukocytes,Ua: NEGATIVE
Nitrite: NEGATIVE
Protein, ur: >=300 mg/dL — AB
Specific Gravity, Urine: 1.012 (ref 1.005–1.030)
pH: 5 (ref 5.0–8.0)

## 2019-01-31 LAB — PROTIME-INR
INR: 1.4 — ABNORMAL HIGH (ref 0.8–1.2)
Prothrombin Time: 16.7 seconds — ABNORMAL HIGH (ref 11.4–15.2)

## 2019-01-31 LAB — IRON AND TIBC
Iron: 65 ug/dL (ref 45–182)
Saturation Ratios: 55 % — ABNORMAL HIGH (ref 17.9–39.5)
TIBC: 119 ug/dL — ABNORMAL LOW (ref 250–450)
UIBC: 54 ug/dL

## 2019-01-31 LAB — PHOSPHORUS: Phosphorus: >30 mg/dL — ABNORMAL HIGH (ref 2.5–4.6)

## 2019-01-31 LAB — HIV ANTIBODY (ROUTINE TESTING W REFLEX): HIV Screen 4th Generation wRfx: NONREACTIVE

## 2019-01-31 LAB — MRSA PCR SCREENING: MRSA by PCR: NEGATIVE

## 2019-01-31 LAB — CBG MONITORING, ED: Glucose-Capillary: 103 mg/dL — ABNORMAL HIGH (ref 70–99)

## 2019-01-31 LAB — LACTIC ACID, PLASMA: Lactic Acid, Venous: 0.7 mmol/L (ref 0.5–1.9)

## 2019-01-31 IMAGING — US ULTRASOUND SCROTUM DOPPLER COMPLETE
1 series · 14 of 25 positions shown · non-contrast
Comparison: CT Abdomen and Pelvis [DATE].

CLINICAL DATA: 47-year-old male with pain and swelling.

EXAM:
SCROTAL ULTRASOUND
DOPPLER ULTRASOUND OF THE TESTICLES
TECHNIQUE: Complete ultrasound examination of the testicles, epididymis, and
other scrotal structures was performed. Color and spectral Doppler
ultrasound were also utilized to evaluate blood flow to the
testicles.

[Series 1: ultrasound scrotum doppler complete · 14 of 55 slices shown]
[im 1/55]
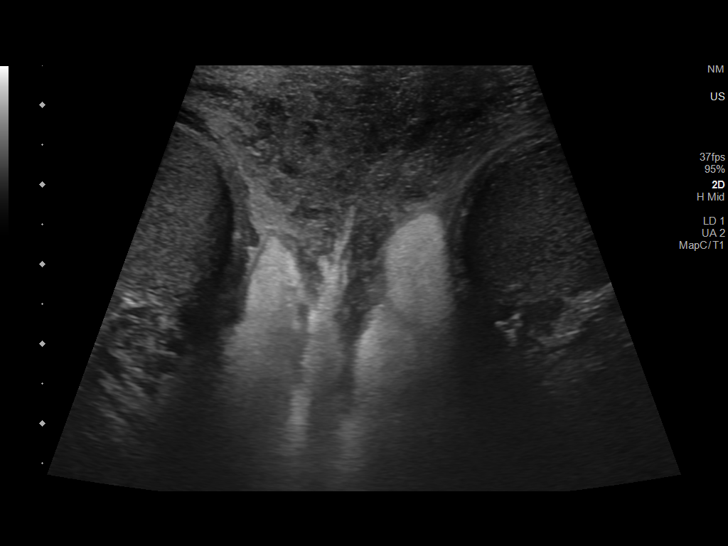
[im 5/55]
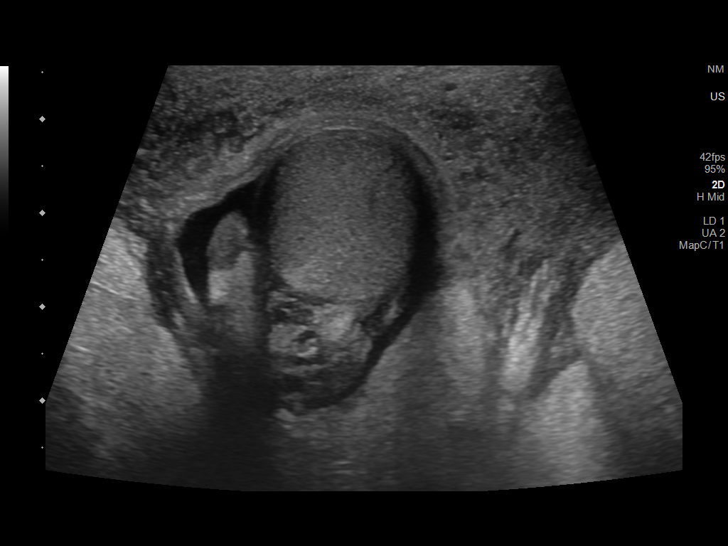
[im 10/55]
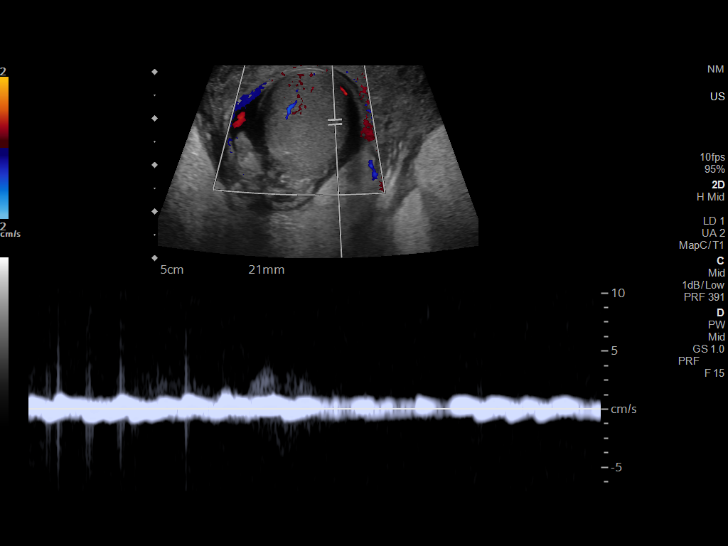
[im 14/55]
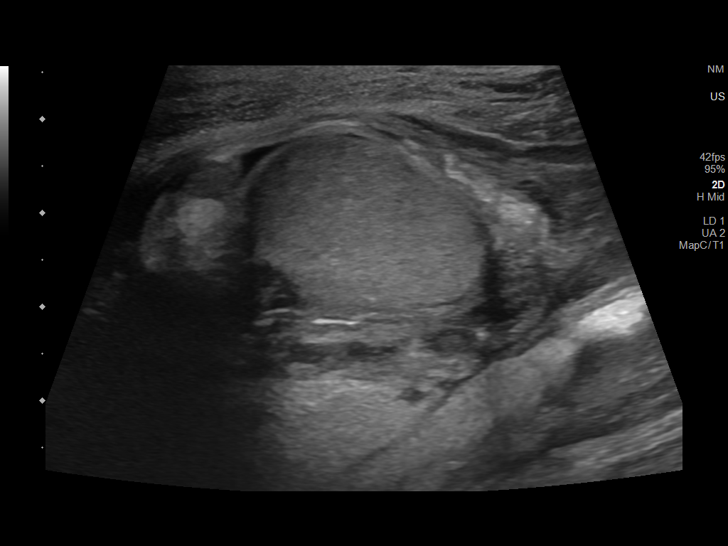
[im 19/55]
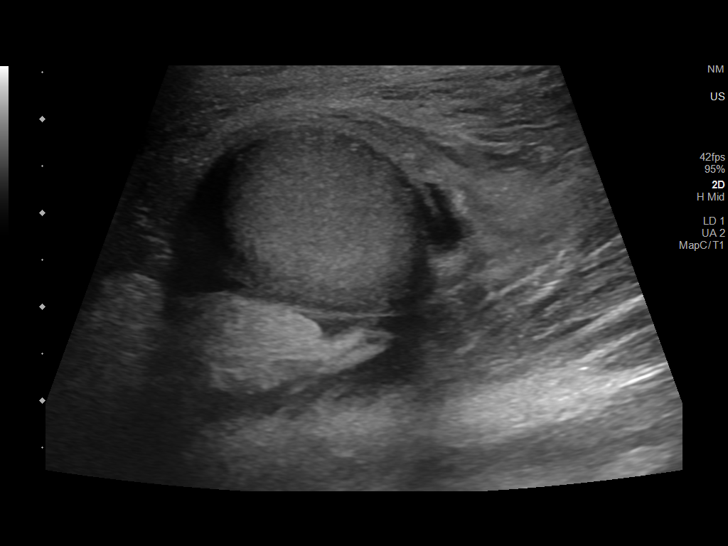
[im 21/55]
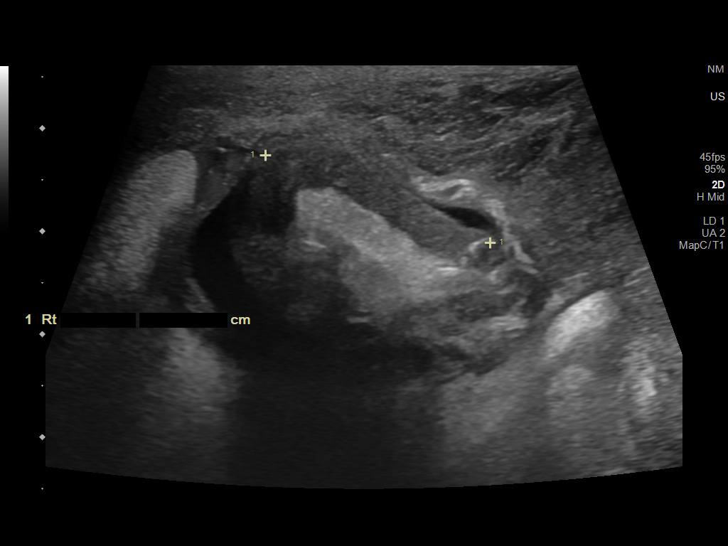
[im 25/55]
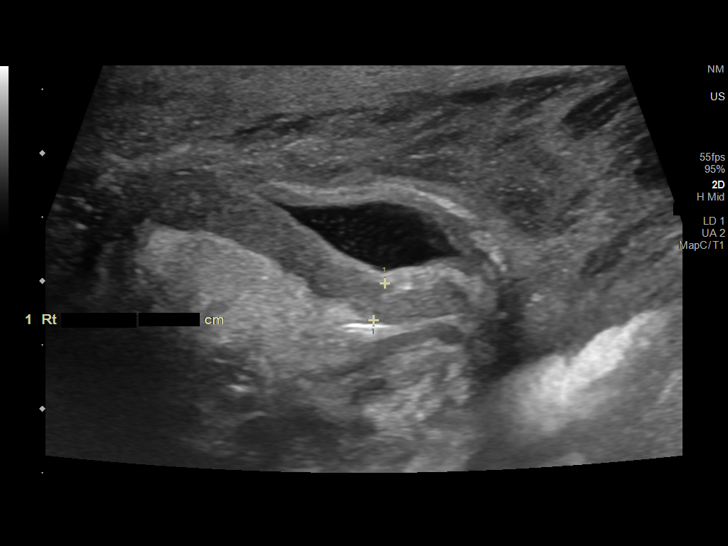
[im 30/55]
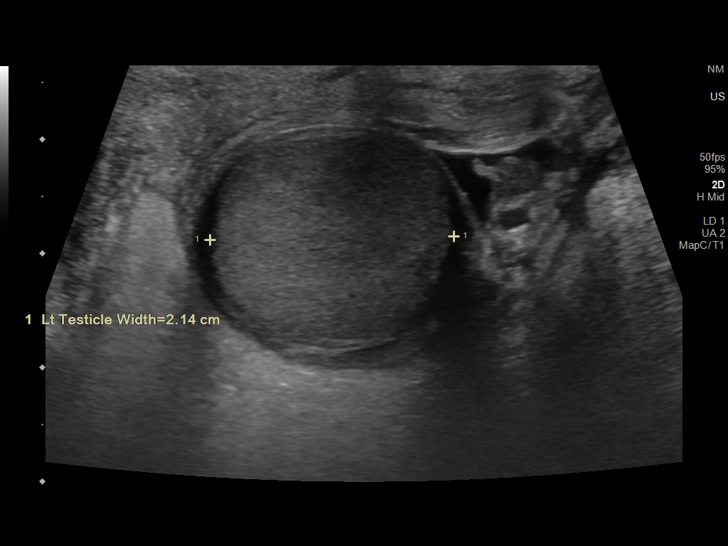
[im 34/55]
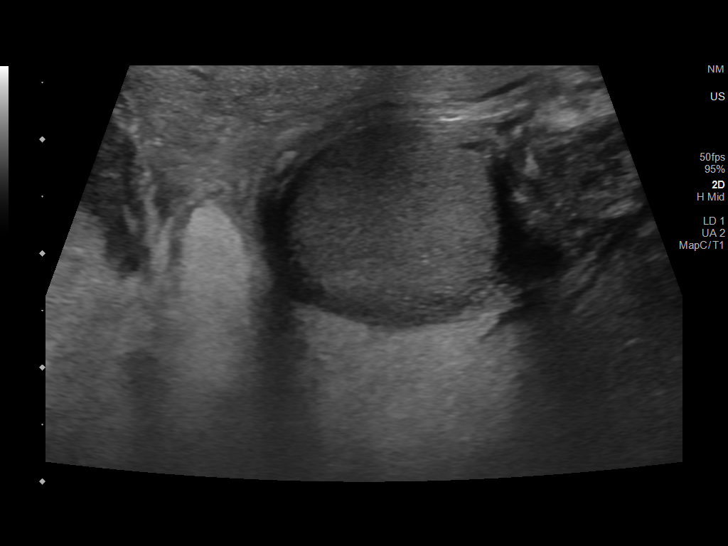
[im 37/55]
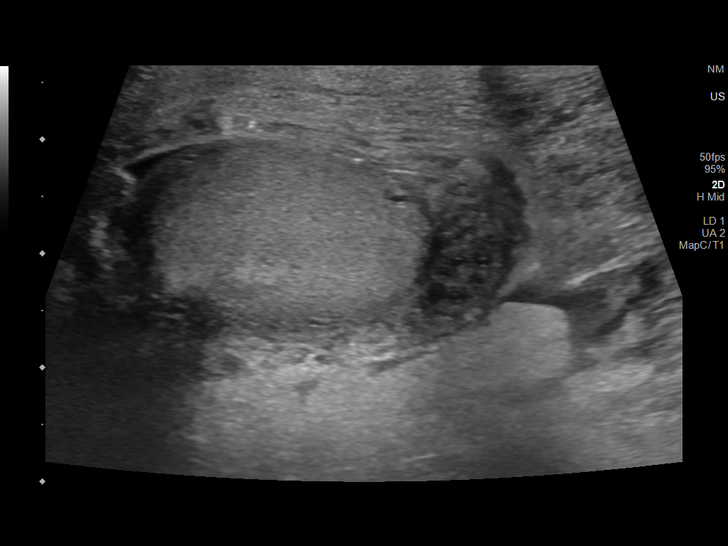
[im 41/55]
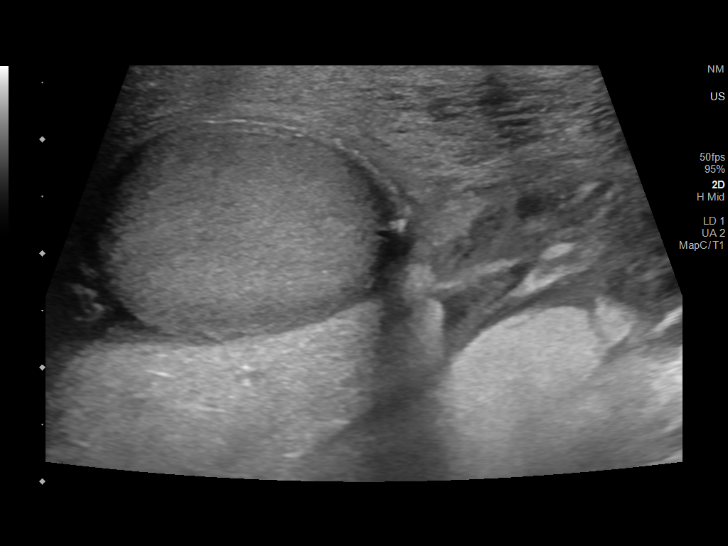
[im 46/55]
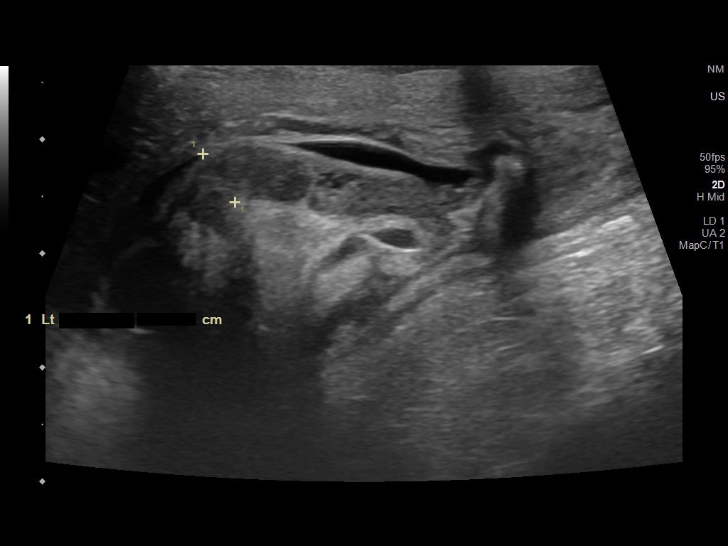
[im 50/55]
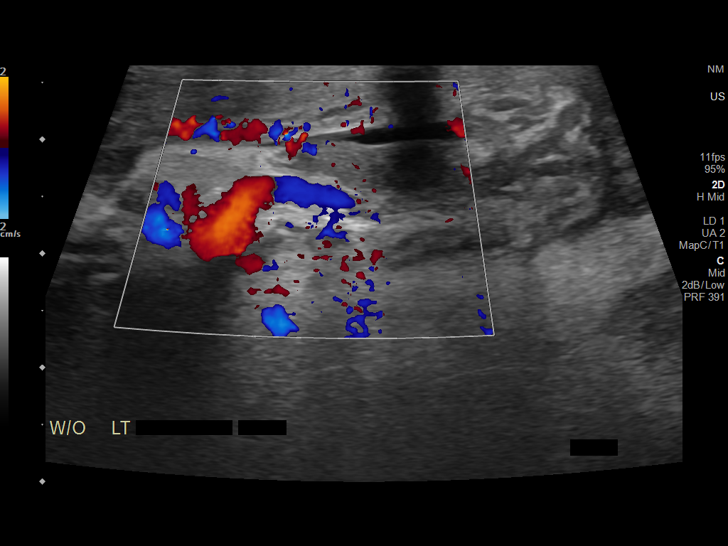
[im 55/55]
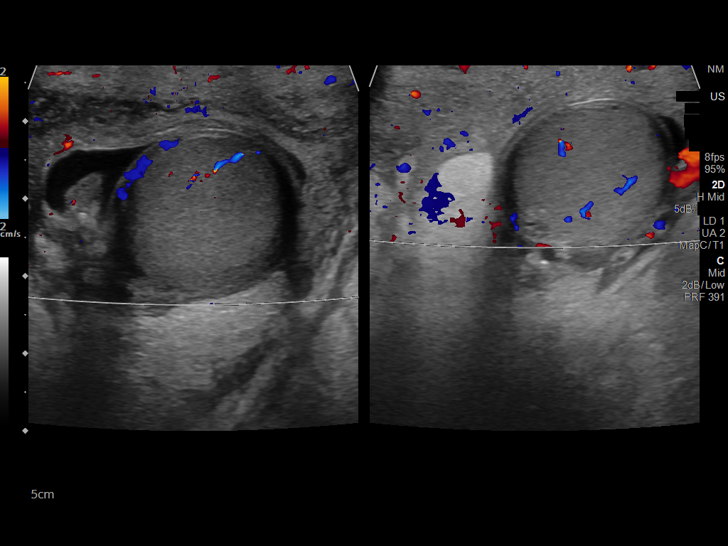

[14 of 25 positions shown; findings below may reference images not displayed]

FINDINGS: Right testicle

Measurements: 3.0 x 2.1 x 1.9 centimeters. No mass or microlithiasis
visualized.

Left testicle

Measurements: 2.8 x 1.9 x 2.2 centimeters. No mass or microlithiasis
visualized.

Right epididymis:  Normal in size and appearance.

Left epididymis:  Normal in size and appearance.

Hydrocele:  None visualized.

Varicocele:  None visualized.

Pulsed Doppler interrogation of both testes demonstrates normal low
resistance arterial and venous waveforms bilaterally.

Other findings: Generalized scrotal soft tissue thickening or edema.
No ultrasound evidence of soft tissue gas.
IMPRESSION: No evidence of testicular torsion, and negative aside from
generalized scrotal soft tissue thickening/edema.

## 2019-01-31 IMAGING — US IR RIGHT FLUORO GUIDE CV LINE
2 series · 3 of 3 positions shown · non-contrast
Comparison: none

CLINICAL DATA: Renal failure, needs hemodialysis access. Elevated
white blood cell count of indeterminate significance.

EXAM:
EXAM
RIGHT IJ CATHETER PLACEMENT UNDER ULTRASOUND AND FLUOROSCOPIC
GUIDANCE
TECHNIQUE: The procedure, risks (including but not limited to bleeding,
infection, organ damage, pneumothorax), benefits, and alternatives
were explained to the family. Questions regarding the procedure were
encouraged and answered. The family understands and consents to the
procedure. Patency of the right IJ vein was confirmed with
ultrasound with image documentation. An appropriate skin site was
determined. Skin site was marked. Region was prepped using maximum
barrier technique including cap and mask, sterile gown, sterile
gloves, large sterile sheet, and Chlorhexidine as cutaneous
antisepsis. The region was infiltrated locally with 1% lidocaine.
Under real-time ultrasound guidance, the right IJ vein was accessed
with a 21 gauge needle; the needle tip within the vein was confirmed
with ultrasound image documentation. The needle exchanged over a 018
guidewire for vascular dilator which allowed advancement of a 16 cm
Mahurkar catheter. This was positioned with the tip at the
cavoatrial junction. Spot chest radiograph shows good positioning
and no pneumothorax. Catheter was flushed and sutured externally
with 0-Prolene sutures. Patient tolerated the procedure well.
FLUOROSCOPY TIME:  Less than 6 seconds; 10 [U0] DAP
COMPLICATIONS:
COMPLICATIONS
none

[Series 1: ir right fluoro guide cv line · 1 of 1 slices shown]
[im 1/1]
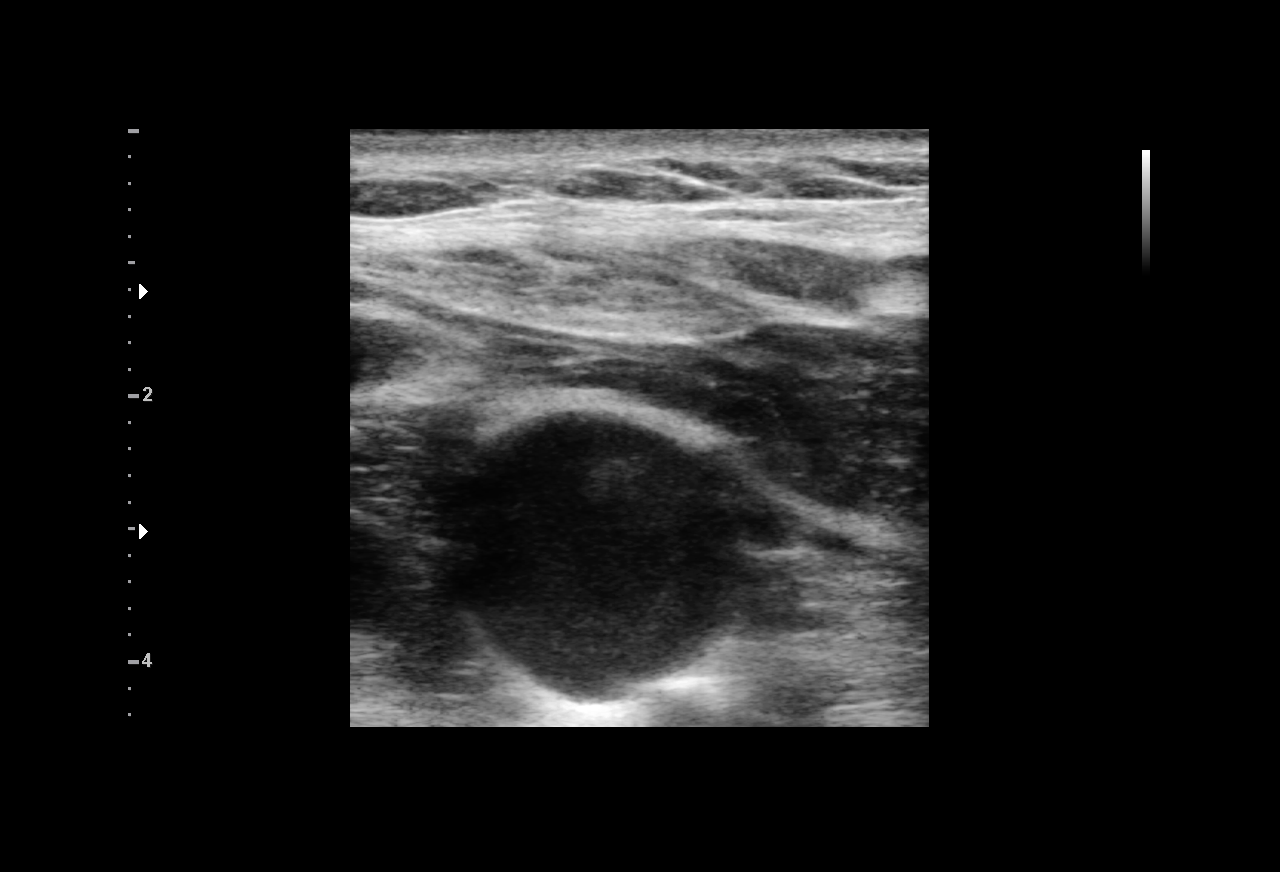

[Series 1: fl (-) angio · 2 of 2 slices shown]
[im 1/2]
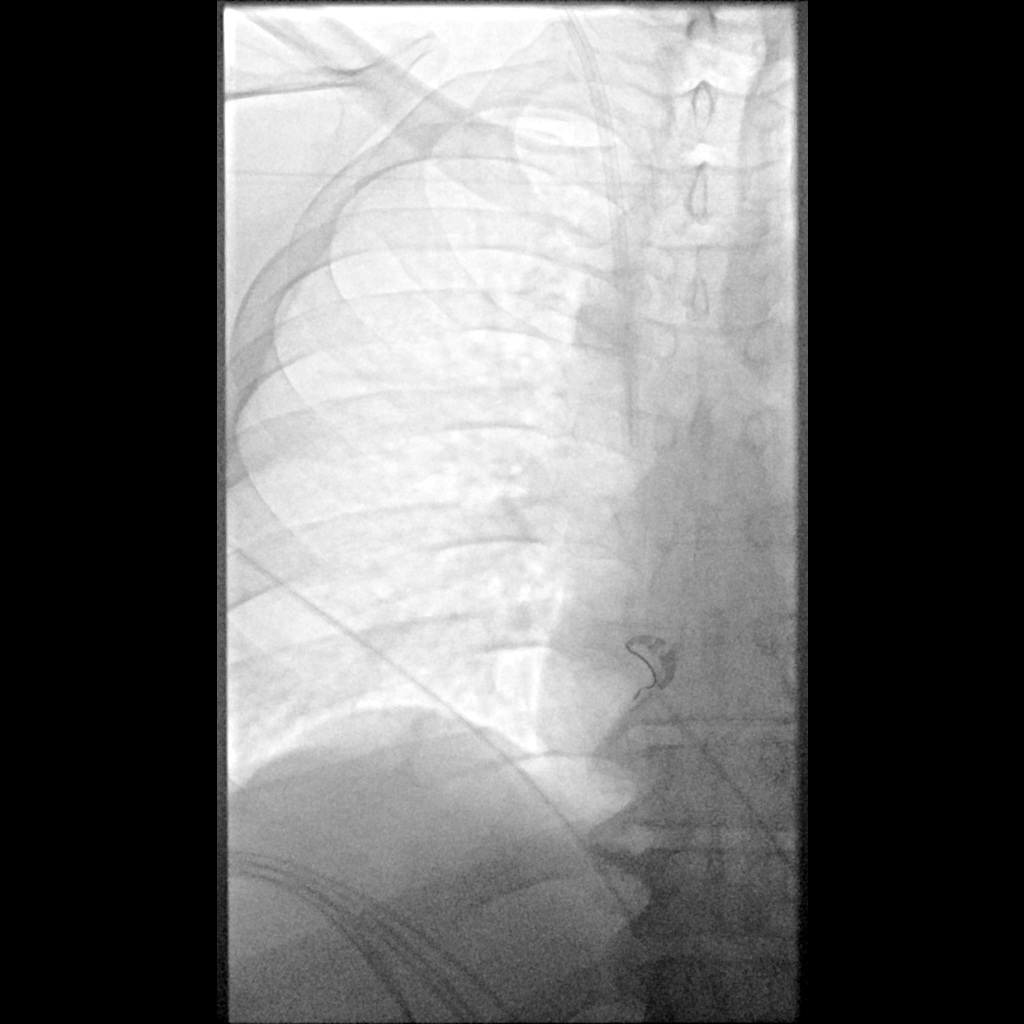
[im 2/2]
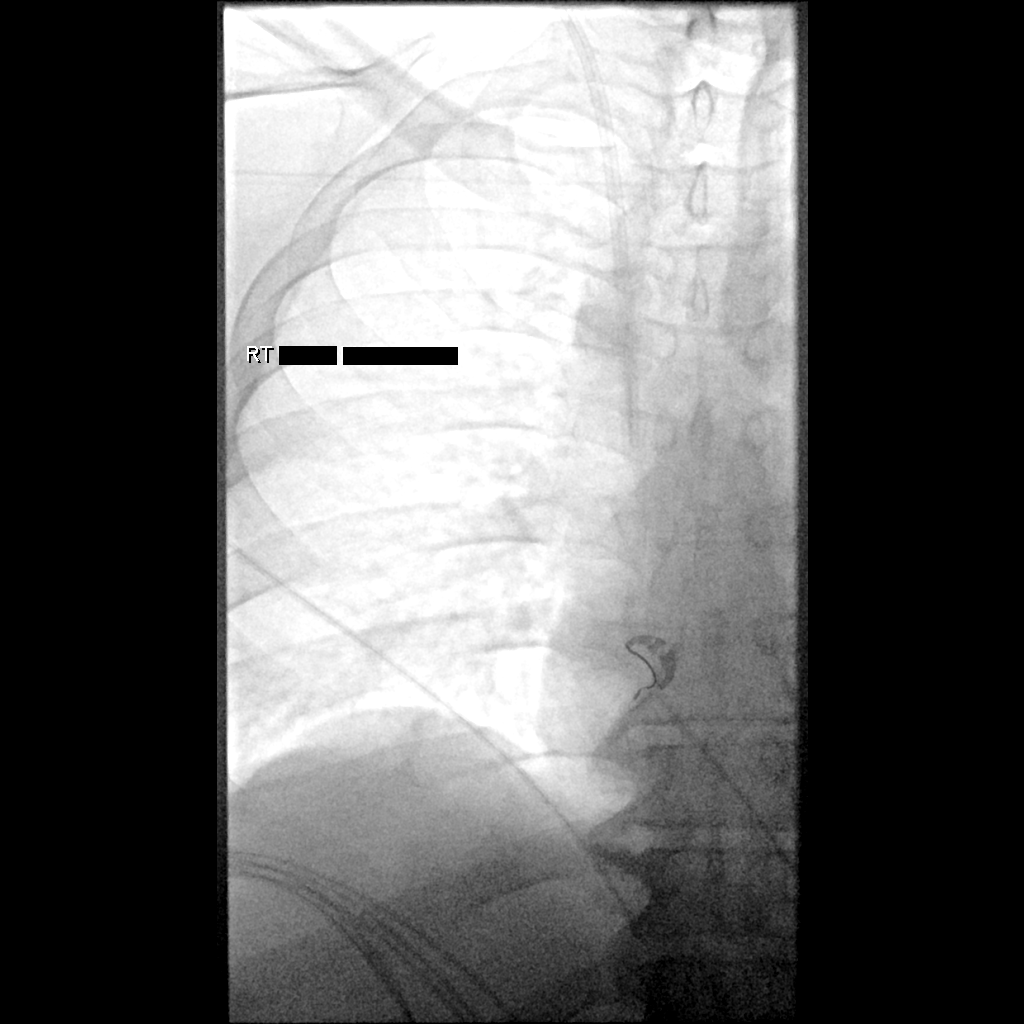

[3 of 3 positions shown; findings below may reference images not displayed]

IMPRESSION: 1. Technically successful right IJ Mahurkar catheter placement.

## 2019-01-31 IMAGING — US US RENAL
1 series · 14 of 25 positions shown · non-contrast
Comparison: None.

CLINICAL DATA: Acute renal failure. History of diabetes,
hypertension, chronic kidney disease.

EXAM:
RENAL / URINARY TRACT ULTRASOUND COMPLETE

[Series 1: us renal · 14 of 34 slices shown]
[im 1/34]
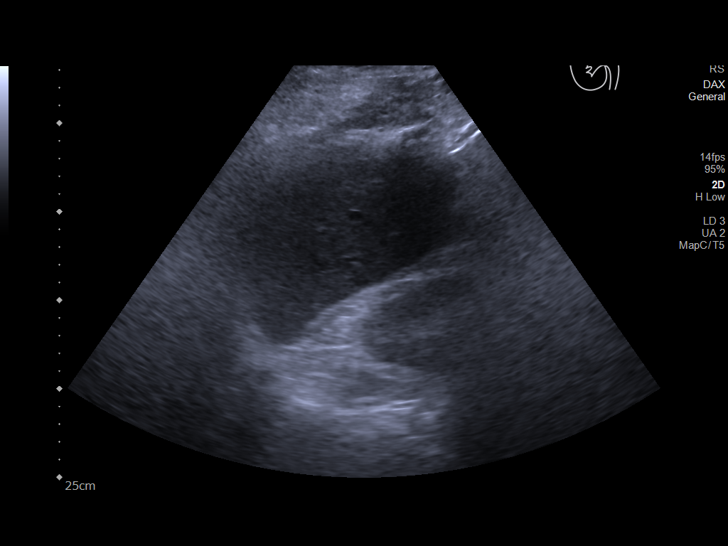
[im 3/34]
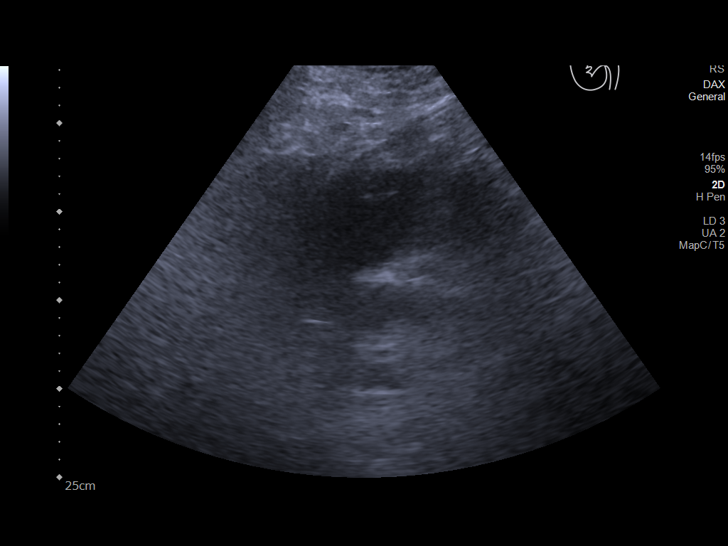
[im 6/34]
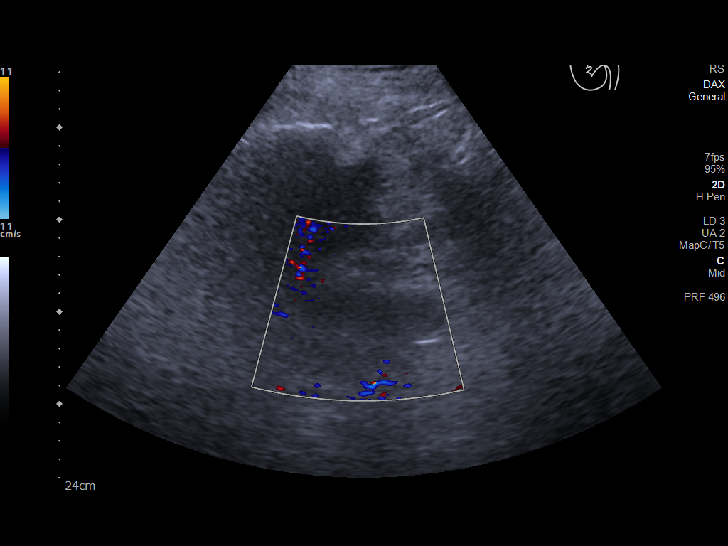
[im 9/34]
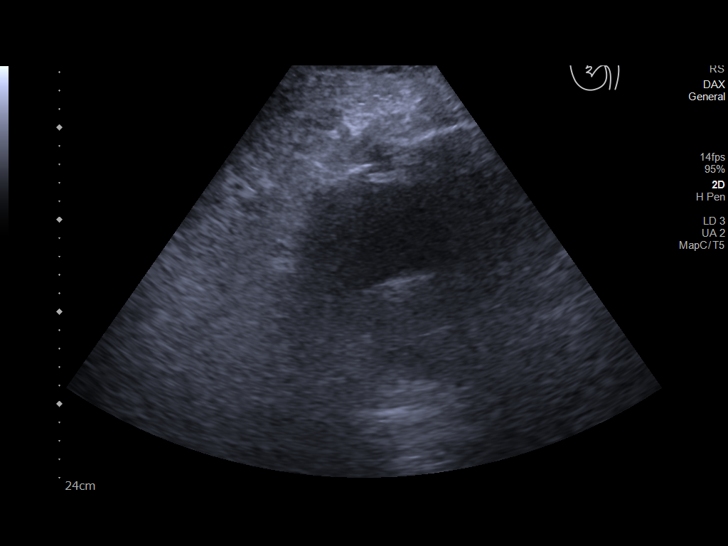
[im 12/34]
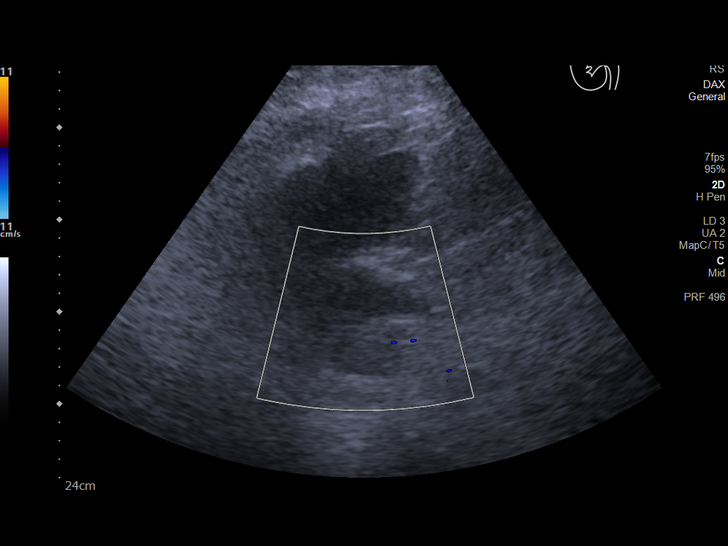
[im 13/34]
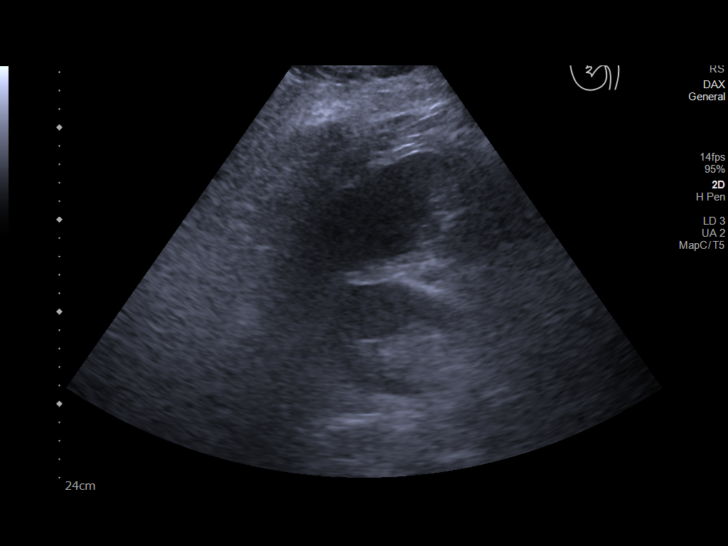
[im 16/34]
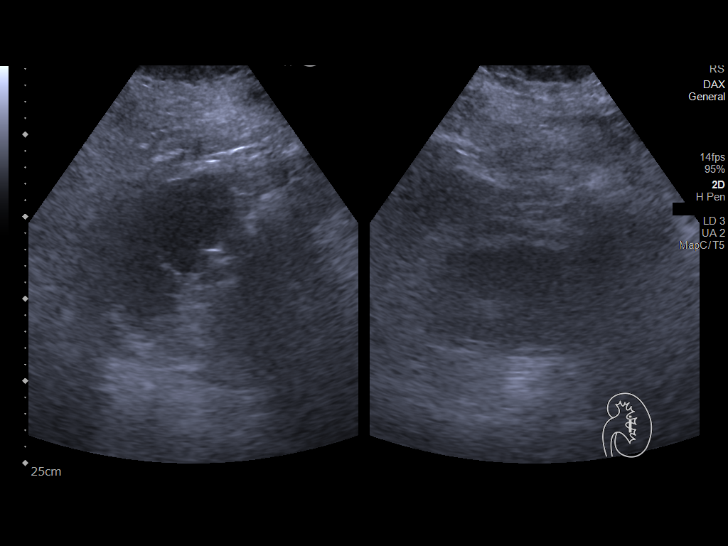
[im 18/34]
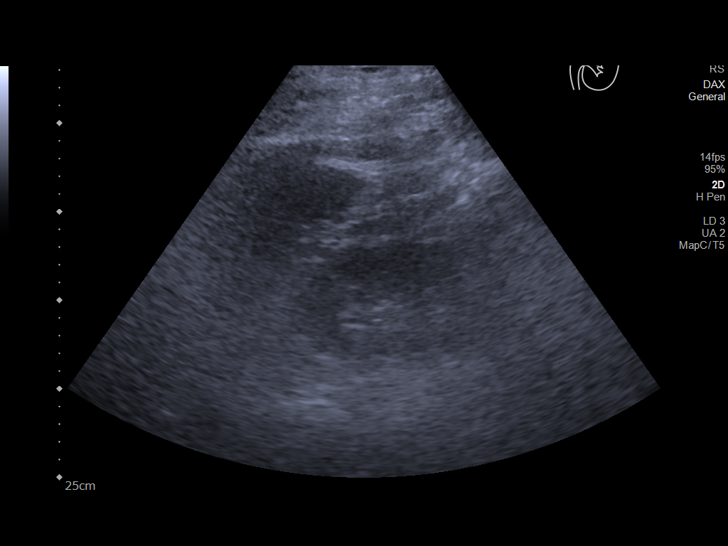
[im 21/34]
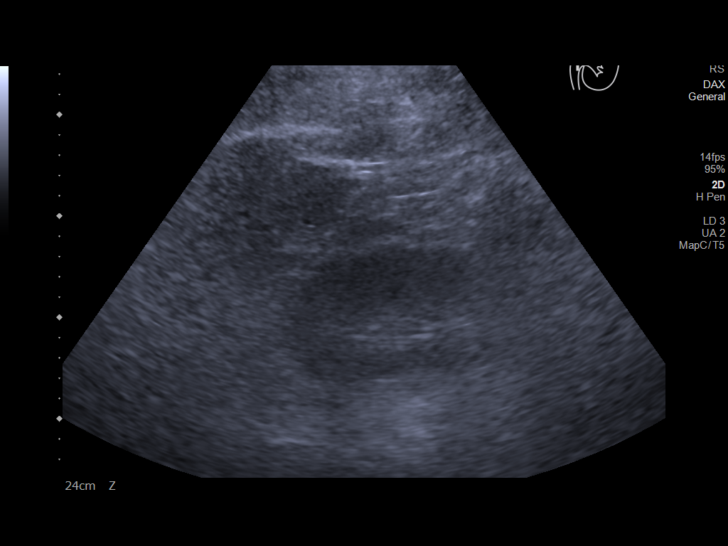
[im 23/34]
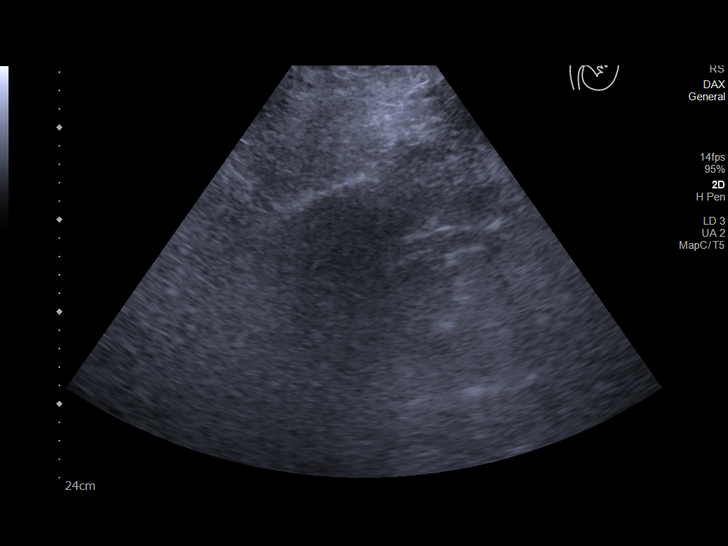
[im 25/34]
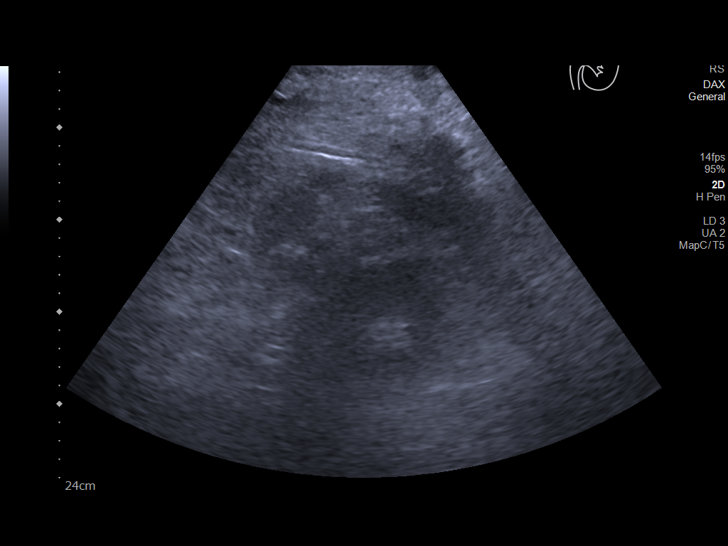
[im 28/34]
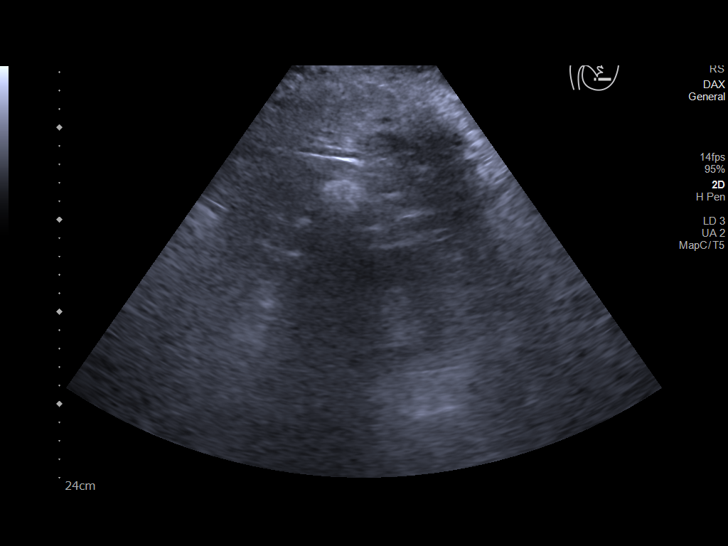
[im 31/34]
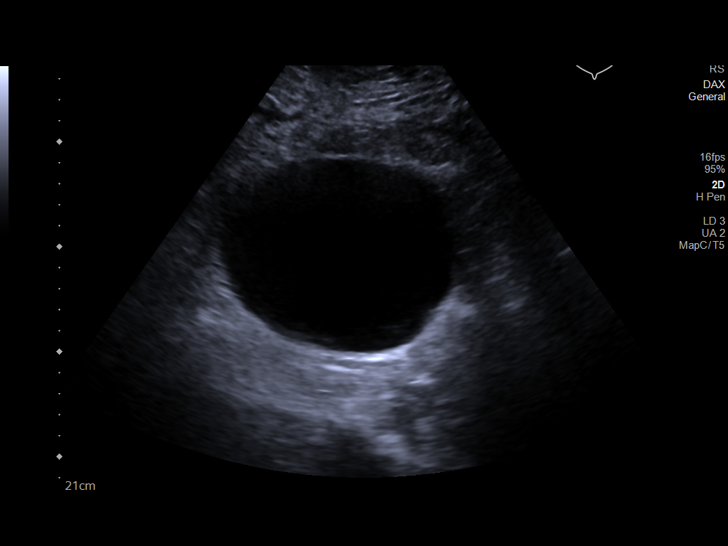
[im 34/34]
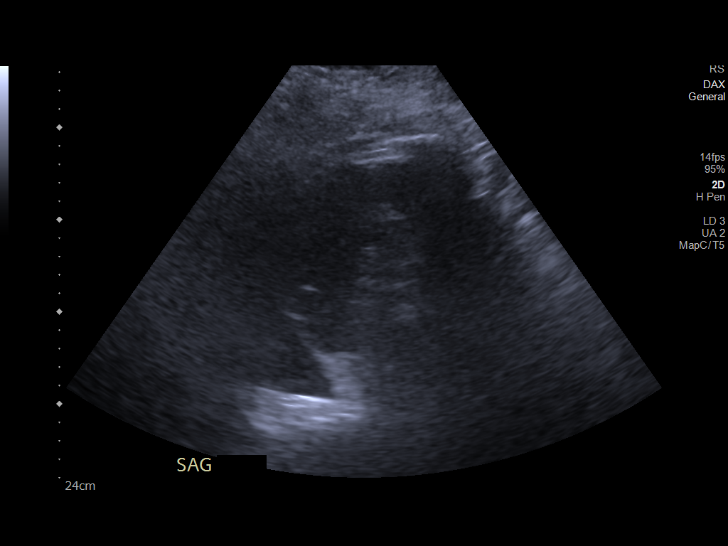

[14 of 25 positions shown; findings below may reference images not displayed]

FINDINGS: Right Kidney:

Renal measurements: 10.7 x 6.2 x 6.2 cm = volume: 214 mL. Suboptimal
visualization. Echogenicity within normal limits. No mass or
hydronephrosis visualized.

Left Kidney:

Renal measurements: 11.7 x 7.3 x 5.8 cm = volume: 256 mL. Suboptimal
visualization. Echogenicity within normal limits. No mass or
hydronephrosis visualized.

Bladder:

Appears normal for degree of bladder distention.
IMPRESSION: Suboptimal visualization of both kidneys. No gross abnormality. No
hydronephrosis.

## 2019-01-31 MED ORDER — SODIUM CHLORIDE 0.9 % IV SOLN: 100.0000 mL | INTRAVENOUS | Status: DC | PRN

## 2019-01-31 MED ORDER — CLONIDINE HCL 0.1 MG PO TABS
0.1000 mg | ORAL_TABLET | Freq: Once | ORAL | Status: AC
  Administered 2019-01-31: 0.1 mg via ORAL
  Filled 2019-01-31 (×2): qty 1

## 2019-01-31 MED ORDER — LIDOCAINE HCL (PF) 1 % IJ SOLN: 5.0000 mL | INTRAMUSCULAR | Status: DC | PRN

## 2019-01-31 MED ORDER — HEPARIN SODIUM (PORCINE) 1000 UNIT/ML IJ SOLN
INTRAMUSCULAR | Status: AC
  Administered 2019-01-31: 2.6 mL
  Filled 2019-01-31: qty 1

## 2019-01-31 MED ORDER — LIDOCAINE HCL 1 % IJ SOLN
INTRAMUSCULAR | Status: AC
  Filled 2019-01-31: qty 20

## 2019-01-31 MED ORDER — HYDRALAZINE HCL 20 MG/ML IJ SOLN
INTRAMUSCULAR | Status: AC
  Filled 2019-01-31: qty 1

## 2019-01-31 MED ORDER — LIDOCAINE HCL (PF) 1 % IJ SOLN
INTRAMUSCULAR | Status: AC | PRN
  Administered 2019-01-31: 5 mL

## 2019-01-31 MED ORDER — PENTAFLUOROPROP-TETRAFLUOROETH EX AERO: 1.0000 "application " | INHALATION_SPRAY | CUTANEOUS | Status: DC | PRN

## 2019-01-31 MED ORDER — HEPARIN SODIUM (PORCINE) 1000 UNIT/ML DIALYSIS
1000.0000 [IU] | INTRAMUSCULAR | Status: DC | PRN
  Administered 2019-01-31 – 2019-02-01 (×2): 2600 [IU] via INTRAVENOUS_CENTRAL
  Filled 2019-01-31: qty 1

## 2019-01-31 MED ORDER — HEPARIN SODIUM (PORCINE) 1000 UNIT/ML IJ SOLN
INTRAMUSCULAR | Status: AC
  Filled 2019-01-31: qty 3

## 2019-01-31 MED ORDER — CALCIUM GLUCONATE-NACL 1-0.675 GM/50ML-% IV SOLN
1.0000 g | Freq: Once | INTRAVENOUS | Status: AC
  Administered 2019-01-31: 1000 mg via INTRAVENOUS
  Filled 2019-01-31: qty 50

## 2019-01-31 MED ORDER — LIDOCAINE-PRILOCAINE 2.5-2.5 % EX CREA: 1.0000 "application " | TOPICAL_CREAM | CUTANEOUS | Status: DC | PRN

## 2019-01-31 MED ORDER — INSULIN ASPART 100 UNIT/ML ~~LOC~~ SOLN
0.0000 [IU] | SUBCUTANEOUS | Status: DC
  Administered 2019-02-01 – 2019-02-02 (×2): 1 [IU] via SUBCUTANEOUS

## 2019-01-31 MED ORDER — ALTEPLASE 2 MG IJ SOLR: 2.0000 mg | Freq: Once | INTRAMUSCULAR | Status: DC | PRN

## 2019-01-31 MED ORDER — DARBEPOETIN ALFA 60 MCG/0.3ML IJ SOSY
PREFILLED_SYRINGE | INTRAMUSCULAR | Status: AC
  Filled 2019-01-31: qty 0.3

## 2019-01-31 MED ORDER — SODIUM BICARBONATE 8.4 % IV SOLN
100.0000 meq | Freq: Once | INTRAVENOUS | Status: AC
  Administered 2019-01-31: 100 meq via INTRAVENOUS
  Filled 2019-01-31: qty 50

## 2019-01-31 NOTE — Consult Note (Signed)
Chief Complaint: Patient was seen in consultation today for renal failure  Referring Physician(s): Dr. Hal Hope  Supervising Physician: Arne Cleveland  Patient Status: [facility] - In-pt  History of Present Illness: Michael Riggs is a 48 y.o. male with past medical history of DM, HTN, GERD, recent + RPR, and chronic kidney disease stage 5 who presented to [facility] ED with altered mental status. Patient with progressive renal failure with ongoing work-up as an outpatient.  Recently seen by vascular surgery with creation of a L arm AV fistula 01/09/19.  Fistula not yet mature or ready for use. Patient now with SCr of 19.1, uremic, in need of urgent dialysis.  IR consulted for temporary vs. Tunneled catheter placement.   Patient NPO. No blood thinners.   Past Medical History:  Diagnosis Date  . Anemia   . Chronic kidney disease    Stage 5  . Depression   . Diabetes mellitus without complication ([place])   . Dyspnea   . GERD (gastroesophageal reflux disease)   . Hypertension     Past Surgical History:  Procedure Laterality Date  . AV FISTULA PLACEMENT Left 01/09/2019   Procedure: ARTERIOVENOUS (AV) FISTULA CREATION LEFT UPPER ARM;  Surgeon: [name], MD;  Location: [facility] OR;  Service: Vascular;  Laterality: Left;  . below the knee amputation Left   . EYE SURGERY      Allergies: Patient has no known allergies.  Medications: Prior to Admission medications   Medication Sig Start Date End Date Taking? Authorizing Provider  albuterol (VENTOLIN HFA) 108 (90 Base) MCG/ACT inhaler Inhale into the lungs every 6 (six) hours as needed for wheezing or shortness of breath.    [provider]  amLODipine (NORVASC) 10 MG tablet Take 10 mg by mouth daily.    [provider]  atorvastatin (LIPITOR) 40 MG tablet Take 40 mg by mouth daily.  07/19/18   [provider]  calcium carbonate (TUMS - DOSED IN MG ELEMENTAL CALCIUM) 500 MG chewable tablet Chew 1 tablet by  mouth daily. As needed    [provider]  citalopram (CELEXA) 40 MG tablet Take 40 mg by mouth daily.  12/13/16   [provider]  doxycycline (VIBRAMYCIN) 100 MG capsule Take 1 capsule (100 mg total) by mouth 2 (two) times daily. 12/30/18   Alroy Bailiff, Margaux, PA-C  gabapentin (NEURONTIN) 300 MG capsule Take 300 mg by mouth 3 (three) times daily.  02/15/17   [provider]  glipiZIDE (GLUCOTROL) 10 MG tablet Take 10 mg by mouth 2 (two) times daily before a meal.  12/13/16   [provider]  hydrOXYzine (ATARAX/VISTARIL) 25 MG tablet Take 25 mg by mouth every 8 (eight) hours as needed for anxiety.  04/26/18   [provider]  LEVEMIR 100 UNIT/ML injection  03/16/17   [provider]  liraglutide (VICTOZA) 18 MG/3ML SOPN Inject 1.8 mg into the skin daily.    [provider]  losartan-hydrochlorothiazide (HYZAAR) 100-12.5 MG tablet Take 1 tablet by mouth daily.  12/13/16   [provider]  Melatonin 5 MG CAPS Take 1 capsule by mouth at bedtime.    [provider]  NOVOFINE PLUS 32G X 4 MM MISC  08/17/18   [provider]  omeprazole (PRILOSEC) 40 MG capsule Take 40 mg by mouth daily.    [provider]  ondansetron (ZOFRAN) 4 MG tablet Take 4 mg by mouth daily as needed for nausea or vomiting.  07/19/18   [provider]  oxyCODONE-acetaminophen (PERCOCET) 5-325 MG tablet Take 1 tablet by mouth every 6 (six) hours as needed for severe pain. 01/09/19   Rhyne, Hulen Shouts, PA-C  sodium bicarbonate 650 MG tablet Take 650 mg by mouth 2 (two) times daily.    [provider]  traMADol (ULTRAM) 50 MG tablet Take by mouth every 6 (six) hours as needed.    [provider]     History reviewed. No pertinent family history.  Social History   Socioeconomic History  . Marital status: Divorced    Spouse name: Not on file  . Number of children: Not on file  . Years of education: Not on file  .  Highest education level: Not on file  Occupational History  . Not on file  Social Needs  . Financial resource strain: Not on file  . Food insecurity    Worry: Not on file    Inability: Not on file  . Transportation needs    Medical: Not on file    Non-medical: Not on file  Tobacco Use  . Smoking status: Former Research scientist (life sciences)  . Smokeless tobacco: Never Used  Substance and Sexual Activity  . Alcohol use: Not Currently    Comment: heavy drinker in the past, none since 07/19/18  . Drug use: Not Currently  . Sexual activity: Not on file  Lifestyle  . Physical activity    Days per week: Not on file    Minutes per session: Not on file  . Stress: Not on file  Relationships  . Social Herbalist on phone: Not on file    Gets together: Not on file    Attends religious service: Not on file    Active member of club or organization: Not on file    Attends meetings of clubs or organizations: Not on file    Relationship status: Not on file  Other Topics Concern  . Not on file  Social History Narrative  . Not on file     Review of Systems: A 12 point ROS discussed and pertinent positives are indicated in the HPI above.  All other systems are negative.  Review of Systems  Unable to perform ROS: Mental status change    Vital Signs: BP (!) 144/92   Pulse 81   Temp (!) 97.5 F (36.4 C) (Axillary)   Resp 11   Wt (!) 304 lb 7.3 oz (138.1 kg)   SpO2 99%   BMI 43.68 kg/m   Physical Exam Vitals signs and nursing note reviewed.  Constitutional:      Appearance: Normal appearance. He is normal weight.  HENT:     Mouth/Throat:     Mouth: Mucous membranes are moist.     Pharynx: Oropharynx is clear.  Cardiovascular:     Rate and Rhythm: Normal rate and regular rhythm.     Pulses: Normal pulses.     Heart sounds: Normal heart sounds. No murmur.  Pulmonary:     Effort: No respiratory distress.     Breath sounds: Normal breath sounds.     Comments: Obtunded.  Periods of  apnea. Deep snores.  Skin:    General: Skin is warm and dry.  Neurological:     General: No focal deficit present.     Mental Status: He is alert and oriented to person, place, and time. Mental status is at baseline.  Psychiatric:        Mood and Affect: Mood normal.  Behavior: Behavior normal.        Thought Content: Thought content normal.        Judgment: Judgment normal.          Imaging: Dg Chest Port 1 View  Result Date: 01/30/2019 CLINICAL DATA:  Weakness EXAM: PORTABLE CHEST 1 VIEW COMPARISON:  December 30, 2018 FINDINGS: Cardiomegaly with vascular congestion. Mild perihilar and bibasilar opacities concerning for early edema. No effusions. No acute bony abnormality. IMPRESSION: Cardiomegaly with vascular congestion and possible early perihilar edema. Electronically Signed   By: Rolm Baptise M.D.   On: 01/30/2019 18:50   Vas [place] Upper Extremity Arterial Duplex  Result Date: 01/03/2019 UPPER EXTREMITY DUPLEX STUDY Indications: Evaluation prior to placement of AVF.  Risk Factors: Hypertension, Diabetes, no history of smoking. Performing Technologist: Delorise Shiner RVT  Examination Guidelines: A complete evaluation includes B-mode imaging, spectral Doppler, color Doppler, and power Doppler as needed of all accessible portions of each vessel. Bilateral testing is considered an integral part of a complete examination. Limited examinations for reoccurring indications may be performed as noted.  Right Pre-Dialysis Findings: +-----------------------+----------+--------------------+---------+--------+ Location               PSV (cm/s)Intralum. Diam. (cm)Waveform Comments +-----------------------+----------+--------------------+---------+--------+ Brachial Antecub. fossa64        0.61                triphasic         +-----------------------+----------+--------------------+---------+--------+ Radial Art at Wrist    62        0.23                triphasic          +-----------------------+----------+--------------------+---------+--------+ Ulnar Art at Wrist     68        0.23                triphasic         +-----------------------+----------+--------------------+---------+--------+ Left Pre-Dialysis Findings: +-----------------------+----------+--------------------+---------+--------+ Location               PSV (cm/s)Intralum. Diam. (cm)Waveform Comments +-----------------------+----------+--------------------+---------+--------+ Brachial Antecub. fossa67        0.61                triphasic         +-----------------------+----------+--------------------+---------+--------+ Radial Art at Wrist    65        0.23                triphasic         +-----------------------+----------+--------------------+---------+--------+ Ulnar Art at Wrist     94        0.30                triphasic         +-----------------------+----------+--------------------+---------+--------+  Summary:  Right: No obstruction visualized in the right upper extremity. Left: No obstruction visualized in the left upper extremity. *See table(s) above for measurements and observations. Electronically signed by [name] MD on 01/03/2019 at 4:07:19 PM.    Final    US Scrotum W/doppler  Result Date: 01/31/2019 CLINICAL DATA:  48 year old male with pain and swelling. EXAM: SCROTAL ULTRASOUND DOPPLER ULTRASOUND OF THE TESTICLES TECHNIQUE: Complete ultrasound examination of the testicles, epididymis, and other scrotal structures was performed. Color and spectral Doppler ultrasound were also utilized to evaluate blood flow to the testicles. COMPARISON:  CT Abdomen and Pelvis 07/31/2017. FINDINGS: Right testicle Measurements: 3.0 x 2.1 x 1.9 centimeters. No mass or microlithiasis visualized. Left testicle Measurements: 2.8  x 1.9 x 2.2 centimeters. No mass or microlithiasis visualized. Right epididymis:  Normal in size and appearance. Left epididymis:  Normal in size and  appearance. Hydrocele:  None visualized. Varicocele:  None visualized. Pulsed Doppler interrogation of both testes demonstrates normal low resistance arterial and venous waveforms bilaterally. Other findings: Generalized scrotal soft tissue thickening or edema. No ultrasound evidence of soft tissue gas. IMPRESSION: No evidence of testicular torsion, and negative aside from generalized scrotal soft tissue thickening/edema. Electronically Signed   By: Genevie Ann M.D.   On: 01/31/2019 00:34   Vas [place] Upper Extremity Vein Mapping  Result Date: 01/03/2019 UPPER EXTREMITY VEIN MAPPING  Indications: Pre-access. History: Evaluation prior to placement of AVF.  Performing Technologist: Delorise Shiner RVT  Examination Guidelines: A complete evaluation includes B-mode imaging, spectral Doppler, color Doppler, and power Doppler as needed of all accessible portions of each vessel. Bilateral testing is considered an integral part of a complete examination. Limited examinations for reoccurring indications may be performed as noted. +-----------------+-------------+----------+--------+ Right Cephalic   Diameter (cm)Depth (cm)Findings +-----------------+-------------+----------+--------+ Shoulder             0.49                        +-----------------+-------------+----------+--------+ Prox upper arm       0.39                        +-----------------+-------------+----------+--------+ Mid upper arm        0.30                        +-----------------+-------------+----------+--------+ Dist upper arm       0.39                        +-----------------+-------------+----------+--------+ Antecubital fossa    0.62                        +-----------------+-------------+----------+--------+ Prox forearm         0.31                        +-----------------+-------------+----------+--------+ Mid forearm          0.35                        +-----------------+-------------+----------+--------+  Dist forearm         0.25                        +-----------------+-------------+----------+--------+ +-----------------+-------------+----------+--------+ Right Basilic    Diameter (cm)Depth (cm)Findings +-----------------+-------------+----------+--------+ Prox upper arm       0.64                        +-----------------+-------------+----------+--------+ Mid upper arm        0.39                        +-----------------+-------------+----------+--------+ Dist upper arm       0.37                        +-----------------+-------------+----------+--------+ Antecubital fossa    0.31                        +-----------------+-------------+----------+--------+  Prox forearm         0.29                        +-----------------+-------------+----------+--------+ +-----------------+-------------+----------+--------+ Left Cephalic    Diameter (cm)Depth (cm)Findings +-----------------+-------------+----------+--------+ Shoulder             0.32                        +-----------------+-------------+----------+--------+ Prox upper arm       0.25                        +-----------------+-------------+----------+--------+ Mid upper arm        0.22                        +-----------------+-------------+----------+--------+ Dist upper arm       0.21                        +-----------------+-------------+----------+--------+ Antecubital fossa    0.25                        +-----------------+-------------+----------+--------+ Prox forearm         0.25                        +-----------------+-------------+----------+--------+ Mid forearm          0.28                        +-----------------+-------------+----------+--------+ Dist forearm         0.34                        +-----------------+-------------+----------+--------+ +-----------------+-------------+----------+--------+ Left Basilic     Diameter (cm)Depth (cm)Findings  +-----------------+-------------+----------+--------+ Prox upper arm       0.43                        +-----------------+-------------+----------+--------+ Mid upper arm        0.50                        +-----------------+-------------+----------+--------+ Dist upper arm       0.57                        +-----------------+-------------+----------+--------+ Antecubital fossa    0.27                        +-----------------+-------------+----------+--------+ There appears to be chronic, 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 Summary: Right: Patent cephalic and basilic veins with diameters as described        above. Left: Patent cephalic vein with diameters as described above. The       basilic vein appears to have chronic, non occlusive thrombus       in distal upper arm/ antecubital fossa segment. *See table(s) above for measurements and observations.  Diagnosing physician: [name] MD Electronically signed by [name] MD on 01/03/2019 at 4:29:13 PM.    Final     Labs:  CBC: Recent Labs    12/30/18 1602  01/30/19 1756 01/30/19 0867 01/30/19 2339 01/31/19 0248  WBC 9.7  --  14.6*  --   --  14.3*  HGB 10.4*   < > 8.3* 8.5* 8.2* 7.8*  HCT 32.8*   < > 28.4* 25.0* 24.0* 24.7*  PLT 260  --  221  --   --  218   < > = values in this interval not displayed.    COAGS: Recent Labs    01/31/19 0248  INR 1.4*    BMP: Recent Labs    12/30/18 1602 01/09/19 0948 01/30/19 1756 01/30/19 2058 01/30/19 2339 01/31/19 0248  NA 138 140 136 136 136 137  K 4.3 4.4 6.4* 6.3* 6.1* 5.9*  CL 111  --  109  --   --  105  CO2 15*  --  9*  --   --  15*  GLUCOSE 124* 76 73  --   --  209*  BUN 65*  --  154*  --   --  152*  CALCIUM 7.5*  --  6.9*  --   --  6.7*  CREATININE 9.32*  --  19.78*  --   --  19.14*  GFRNONAA 6*  --  2*  --   --  3*  GFRAA 7*  --  3*  --   --  3*    LIVER FUNCTION TESTS: Recent Labs     01/30/19 2042 01/31/19 0248  BILITOT 0.7 0.7  AST 17 16  ALT 10 11  ALKPHOS 68 67  PROT 6.8 6.3*  ALBUMIN 2.0* 1.9*    TUMOR MARKERS: No results for input(s): AFPTM, CEA, CA199, CHROMGRNA in the last 8760 hours.  Assessment and Plan: Renal failure Patient admitted with uremia, obtunded.  Has a fistula, however ready for use.   IR consulted for dialysis catheter placement.  Patient also with elevated WBC, however LA WNL, afebrile.  OSA evident on physical exam today.  Advise against sedation at this time.  Discussed with Dr. Vernard Gambles.  Plan to proceed with temporary dialysis catheter placement at this time.  Discussed with brother, Loistine Simas for consent.   Risks and benefits discussed with the patient including, but not limited to bleeding, infection, vascular injury, pneumothorax which may require chest tube placement, air embolism or even death  All of the patient's questions were answered, patient is agreeable to proceed. Consent signed and in chart.  Thank you for this interesting consult.  I greatly enjoyed meeting Byrl Latin and look forward to participating in their care.  A copy of this report was sent to the requesting provider on this date.  Electronically Signed: Docia Barrier, PA 01/31/2019, 8:56 AM   I spent a total of 40 Minutes    in face to face in clinical consultation, greater than 50% of which was counseling/coordinating care for renal failure.

## 2019-01-31 NOTE — Procedures (Signed)
  Procedure: R IJ [place] HD cath 16cm placed to svc/ra jct, ok to use EBL:   minimal Complications:  none immediate  See full dictation in [facility].  Dillard Cannon MD Main # [phone] Pager  [phone]

## 2019-01-31 NOTE — Progress Notes (Signed)
Pt arrived to [facility]. Identified appropriately. Very lethargic, response to voice, oriented to self only. Rumbling speech, pt able to fallow simple commands, moves all extremities. VS stable, no signs of acute distress.  Cardiac monitor and continuous puls ox in place and CCMD notified.  Unable to complete adm ition hx due to pt mental status, very limited when answering question.  Unable to complete education, due to pt mental status. Will continue to monitor and treat pt per MD orders.

## 2019-01-31 NOTE — Progress Notes (Signed)
Renal Navigator received notification to initiate OP HD referral for patient. Renal Navigator attempted to meet with patient in his room, but he was lethargic and somewhat difficult to arouse. Per RN, his family has been contacted regarding medical consents and updates. Patient pleasantly gave permission for Renal Navigator to speak with his family regarding OP HD referral.  Renal Navigator called number listed in chart and patient's brother/Moises Freddrick March answered. He and his wife/Dawn spoke with Renal Navigator regarding referral for patient's OP HD. They state they have a neighbor who receives dialysis at the [facility] clinic, and therefore, are somewhat familiar with it. Patient's sister in law asked, "will he get dialysis through the port until his fistula matures." Renal Navigator confirmed. She states concerns about caring for patient at home, as she states she and patient's brother already come over every day to care for her mother-in-law, who patient lives with. She states concern that he came home from the hospital in the past and was supposed to get PT and "they came a few times and never came back." Renal Navigator suggests she speak with the medical team about all concerns they have regarding patient's needs at discharge. Renal Navigator noted that PT ordered at discharge is not indefinite, but rather ordered for a certain number of visits and then stops, but obviously cannot speak to past orders. Patient's sister in law stated understanding.  Renal Navigator explained that OP HD will be three times per week and asked how patient will get to his appointments. Patient's sister in law stated that patient drives when he is well and that she and patient's brother will be staying with patient for as long as needed once he is discharged. She states they will provide transportation as needed. Renal Navigator also suggests that they may look into [facility] as a transportation supplemental service. She  states their neighbor uses [facility] and they can talk with the neighbor about this service.  OP HD referral submitted to [facility]. Renal Navigator will follow up with Nephrologist/Dr. Johnney Ou and patient/family once seat schedule has been obtained.  Alphonzo Cruise, [place] Renal Navigator [phone]

## 2019-01-31 NOTE — Progress Notes (Signed)
Pharmacy called to get update on calcium gluconate. It will be send shortly.  Sodium bicarb not given, waiting for calcium gluconate to get to the floor.

## 2019-01-31 NOTE — Progress Notes (Signed)
RRT at bedside to assess patient readiness for CPAP for tonight, on my arrival patient is currently unresponsive to voice commands. This RRT shouted loud to follow simple commands and Michael Riggs was unable and unresponsive to my commands. Out of the blue patient shouts "mommie". RN aware. RN/NT currently at bedside w/ me. Staff currently cleaning patient up. Due to confusion and AMS, CPAP will NOT be used per [facility] protocol. Spoken w/ RN to consider oxygen therapy if patient need it throughout the night.

## 2019-01-31 NOTE — Progress Notes (Signed)
[facility] Progress Note   Subjective:   No complaints, lethargic.  Had temp HD catheter placed - due to MS and habitus not felt safe to sedate for TDC.   Objective Vitals:   01/31/19 0204 01/31/19 0408 01/31/19 0808 01/31/19 1045  BP: (!) 144/92  (!) 156/81 (!) 156/78  Pulse:   80 80  Resp:   12 13  Temp: (!) 97.4 F (36.3 C) (!) 97.5 F (36.4 C)  98.3 F (36.8 C)  TempSrc: Oral Axillary    SpO2:   99% 98%  Weight: (!) 138.1 kg      Physical Exam General: lying flat in bed, arousable but lethargic Heart: RRR, no rub Lungs: clear ant Abdomen: soft, obese Extremities: no edema Dialysis Access:  R IJ temp cath  Additional Objective Labs: Basic Metabolic Panel: Recent Labs  Lab 01/30/19 1756 01/30/19 2058 01/30/19 2339 01/31/19 0248  NA 136 136 136 137  K 6.4* 6.3* 6.1* 5.9*  CL 109  --   --  105  CO2 9*  --   --  15*  GLUCOSE 73  --   --  209*  BUN 154*  --   --  152*  CREATININE 19.78*  --   --  19.14*  CALCIUM 6.9*  --   --  6.7*  PHOS  --   --   --  >30.0*   Liver Function Tests: Recent Labs  Lab 01/30/19 2042 01/31/19 0248  AST 17 16  ALT 10 11  ALKPHOS 68 67  BILITOT 0.7 0.7  PROT 6.8 6.3*  ALBUMIN 2.0* 1.9*   No results for input(s): LIPASE, AMYLASE in the last 168 hours. CBC: Recent Labs  Lab 01/30/19 1756 01/30/19 2058 01/30/19 2339 01/31/19 0248  WBC 14.6*  --   --  14.3*  NEUTROABS  --   --   --  11.8*  HGB 8.3* 8.5* 8.2* 7.8*  HCT 28.4* 25.0* 24.0* 24.7*  MCV 103.6*  --   --  96.9  PLT 221  --   --  218   Blood Culture No results found for: SDES, SPECREQUEST, CULT, REPTSTATUS  Cardiac Enzymes: No results for input(s): CKTOTAL, CKMB, CKMBINDEX, TROPONINI in the last 168 hours. CBG: Recent Labs  Lab 01/31/19 0320 01/31/19 0503 01/31/19 0611 01/31/19 0706 01/31/19 0822  GLUCAP 187* 89 96 93 89   Iron Studies:  Recent Labs    01/31/19 0248  IRON 65  TIBC 119*  FERRITIN 284    @lablastinr3 @ Studies/Results: Dg Chest Port 1 View  Result Date: 01/30/2019 CLINICAL DATA:  Weakness EXAM: PORTABLE CHEST 1 VIEW COMPARISON:  December 30, 2018 FINDINGS: Cardiomegaly with vascular congestion. Mild perihilar and bibasilar opacities concerning for early edema. No effusions. No acute bony abnormality. IMPRESSION: Cardiomegaly with vascular congestion and possible early perihilar edema. Electronically Signed   By: Rolm Baptise M.D.   On: 01/30/2019 18:50   US Scrotum W/doppler  Result Date: 01/31/2019 CLINICAL DATA:  48 year old male with pain and swelling. EXAM: SCROTAL ULTRASOUND DOPPLER ULTRASOUND OF THE TESTICLES TECHNIQUE: Complete ultrasound examination of the testicles, epididymis, and other scrotal structures was performed. Color and spectral Doppler ultrasound were also utilized to evaluate blood flow to the testicles. COMPARISON:  CT Abdomen and Pelvis 07/31/2017. FINDINGS: Right testicle Measurements: 3.0 x 2.1 x 1.9 centimeters. No mass or microlithiasis visualized. Left testicle Measurements: 2.8 x 1.9 x 2.2 centimeters. No mass or microlithiasis visualized. Right epididymis:  Normal in size and appearance. Left epididymis:  Normal in size and appearance. Hydrocele:  None visualized. Varicocele:  None visualized. Pulsed Doppler interrogation of both testes demonstrates normal low resistance arterial and venous waveforms bilaterally. Other findings: Generalized scrotal soft tissue thickening or edema. No ultrasound evidence of soft tissue gas. IMPRESSION: No evidence of testicular torsion, and negative aside from generalized scrotal soft tissue thickening/edema. Electronically Signed   By: Genevie Ann M.D.   On: 01/31/2019 00:34   Medications: .  sodium bicarbonate  infusion 1000 mL 75 mL/hr at 01/31/19 0916   . Chlorhexidine Gluconate Cloth  6 each Topical Q0600  . darbepoetin (ARANESP) injection - DIALYSIS  60 mcg Intravenous Q Tue-HD  . lidocaine      . sodium zirconium  cyclosilicate  10 g Oral Once  . sodium zirconium cyclosilicate  10 g Oral TID    Assessment/Plan **AMS secondary to uremia:  AVF immature so s/p temp HD catheter placement - plan HD 1 today, 2 tomorrow. No UF.  Will need TDC conversion when safe.  Start CLIP today.   **Hyperkalemia:  K 6.4 without EKG changes.  Improved to 5.9 with lokelma and shifting.  HD should correct.   **AGMA:  Serum bicarb 9, ABG with 7.19 / 23.  Secondary to renal failure.  On bicarb gtt for now until dialysis.  **Anemia:  Hb 8.3 now 7.8.  Initiate ESA while admitted - 107mcg qTues.  Iron replete 7/14.  **DM:  H/o poor control but in setting of worsening renal function recent A1cs have been ok.  Hypoglycemic on presentation.  Per primary.    **HTN:  Moderately hypertensive here.  Home med amlodipine 10.  Continue.    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  Will need to track down health dept records.  Unclear to me that he truly went to visit.   **urinary retention:  Unable to void and bladder scan 561mL - foley.  TOV prior to discharge.   Jannifer Hick MD 01/31/2019, 11:10 AM  [facility] Pager: [phone]

## 2019-01-31 NOTE — Consult Note (Signed)
NAME:  Michael Riggs, MRN:  [id number], DOB:  Jun 30, 1971, LOS: 1 ADMISSION DATE:  01/30/2019, CONSULTATION DATE:  01/31/19 REFERRING MD:  Hal Hope  CHIEF COMPLAINT:  Hyperkalemia   Brief History   Michael Riggs is a 48 y.o. male who was admitted 7/14 with confusion.  Felt to be due to uremia.  Seen by nephrology who recommended bicarb gtt and TDC placement 7/14 for HD (LUE AVF not matured yet, placed 01/09/19).  History of present illness   Michael Riggs is a 48 y.o. male who has a PMH including but not limited to obesity, DM, HTN, CKD s/p recent LUE AVF placed 01/09/19 in anticipation of dialysis (see "past medical history" for rest).  He presented to [facility] ED 7/14 with confusion.  Found to be uremic, hypoglycemic, acidemic (AGMA).  Apparently recently had appt at health dept and was found to have + RPR, reportedly received syphilis.  Evaluated by nephrology who recommended bicarb infusion with TDC placed 7/14 by IR for HD.  Admitted by [facility]; however, PCCM consulted later due to concern for worsening encephalopathy.  Past Medical History  HTN, GERD, DM, CKD.  Significant Hospital Events   7/13 > admit.  Consults:  Nephrology. PCCM.  Procedures:  None.  Significant Diagnostic Tests:  CXR 7/13 > mild edema.  Micro Data:  SARS CoV 2 7/13 > neg.  Antimicrobials:  None.   Interim history/subjective:  HD stable. No complaints.  Objective:  Blood pressure (!) 150/104, pulse 81, temperature 97.8 F (36.6 C), temperature source Oral, resp. rate 11, SpO2 98 %.       No intake or output data in the 24 hours ending 01/31/19 0014 There were no vitals filed for this visit.  Examination: General: Obese male, in NAD. Neuro: Sleepy but easily arouseable. Answers questions appropriately. HEENT: [place]/AT. Sclerae anicteric.  MM dry. Cardiovascular: RRR, no M/R/G.  Lungs: Respirations even and unlabored.  CTA bilaterally, No W/R/R.  Abdomen: Obese, BS x 4, soft,  NT/ND.  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   LUE AVF with + thrill/bruit. Skin: Intact, warm, no rashes.  Assessment & Plan:   Acute encephalopathy - felt to be due to uremia. - Evaluated by nephrology who recommends bicarb gtt and TDC placement in AM 7/14 for HD (LUE AVF not matured yet). - Avoid sedating meds.  Hyperkalemia - s/p temporizing measures. - Additional 1g Ca gluconate now. - Additional 2 amps HCO3 now, then continue bicarb gtt. - HD per nephrology. - Follow BMP.  AGMA. - Continue bicarb per nephrology.  Presumed OSA / OHS. - Nocturnal CPAP.  Rest per primary team.  Continue supportive care as above.  No indication for ICU admission at this time.  Nothing further to add.  PCCM will sign off.  Please do not hesitate to call us back if we can be of any further assistance.   Labs   CBC: Recent Labs  Lab 01/30/19 1756 01/30/19 2058 01/30/19 2339  WBC 14.6*  --   --   HGB 8.3* 8.5* 8.2*  HCT 28.4* 25.0* 24.0*  MCV 103.6*  --   --   PLT 221  --   --    Basic Metabolic Panel: Recent Labs  Lab 01/30/19 1756 01/30/19 2058 01/30/19 2339  NA 136 136 136  K 6.4* 6.3* 6.1*  CL 109  --   --   CO2 9*  --   --   GLUCOSE 73  --   --   BUN 154*  --   --  CREATININE 19.78*  --   --   CALCIUM 6.9*  --   --    GFR: CrCl cannot be calculated (Unknown ideal weight.). Recent Labs  Lab 01/30/19 1756 01/30/19 2044  WBC 14.6*  --   LATICACIDVEN  --  0.6   Liver Function Tests: Recent Labs  Lab 01/30/19 2042  AST 17  ALT 10  ALKPHOS 68  BILITOT 0.7  PROT 6.8  ALBUMIN 2.0*   No results for input(s): LIPASE, AMYLASE in the last 168 hours. Recent Labs  Lab 01/30/19 2042  AMMONIA 28   ABG    Component Value Date/Time   PHART 7.148 (LL) 01/30/2019 2339   PCO2ART 31.0 (L) 01/30/2019 2339   PO2ART 87.0 01/30/2019 2339   HCO3 10.7 (L) 01/30/2019 2339   TCO2 12 (L) 01/30/2019 2339   ACIDBASEDEF 17.0 (H) 01/30/2019 2339   O2SAT 93.0 01/30/2019 2339     Coagulation Profile: No results for input(s): INR, PROTIME in the last 168 hours. Cardiac Enzymes: No results for input(s): CKTOTAL, CKMB, CKMBINDEX, TROPONINI in the last 168 hours. HbA1C: No results found for: HGBA1C CBG: Recent Labs  Lab 01/30/19 1835 01/30/19 1931 01/30/19 2121 01/30/19 2309 01/30/19 2328  GLUCAP 92 83 76 64* 152*    Review of Systems:   All negative; except for those that are bolded, which indicate positives.  Constitutional: weight loss, weight gain, night sweats, fevers, chills, fatigue, weakness.  HEENT: headaches, sore throat, sneezing, nasal congestion, post nasal drip, difficulty swallowing, tooth/dental problems, visual complaints, visual changes, ear aches. Neuro: difficulty with speech, weakness, numbness, ataxia. CV:  chest pain, orthopnea, PND, swelling in lower extremities, dizziness, palpitations, syncope.  Resp: cough, hemoptysis, dyspnea, wheezing. GI: heartburn, indigestion, abdominal pain, nausea, vomiting, diarrhea, constipation, change in bowel habits, loss of appetite, hematemesis, melena, hematochezia.  GU: dysuria, change in color of urine, urgency or frequency, flank pain, hematuria. MSK: joint pain or swelling, decreased range of motion. Psych: change in mood or affect, depression, anxiety, suicidal ideations, homicidal ideations. Skin: rash, itching, bruising.  Past medical history  He,  has a past medical history of Anemia, Chronic kidney disease, Depression, Diabetes mellitus without complication ([place]), Dyspnea, GERD (gastroesophageal reflux disease), and Hypertension.   Surgical History    Past Surgical History:  Procedure Laterality Date  . AV FISTULA PLACEMENT Left 01/09/2019   Procedure: ARTERIOVENOUS (AV) FISTULA CREATION LEFT UPPER ARM;  Surgeon: [name], MD;  Location: [facility] OR;  Service: Vascular;  Laterality: Left;  . below the knee amputation Left   . EYE SURGERY       Social History   reports that he has quit  smoking. He has never used smokeless tobacco. He reports previous alcohol use. He reports previous drug use.   Family history   His family history is not on file.   Allergies No Known Allergies   Home meds  Prior to Admission medications   Medication Sig Start Date End Date Taking? Authorizing Provider  albuterol (VENTOLIN HFA) 108 (90 Base) MCG/ACT inhaler Inhale into the lungs every 6 (six) hours as needed for wheezing or shortness of breath.    [provider]  amLODipine (NORVASC) 10 MG tablet Take 10 mg by mouth daily.    [provider]  atorvastatin (LIPITOR) 40 MG tablet Take 40 mg by mouth daily.  07/19/18   [provider]  calcium carbonate (TUMS - DOSED IN MG ELEMENTAL CALCIUM) 500 MG chewable tablet Chew 1 tablet by mouth daily. As  needed    [provider]  citalopram (CELEXA) 40 MG tablet Take 40 mg by mouth daily.  12/13/16   [provider]  doxycycline (VIBRAMYCIN) 100 MG capsule Take 1 capsule (100 mg total) by mouth 2 (two) times daily. 12/30/18   Alroy Bailiff, Marg[place], PA-C  gabapentin (NEURONTIN) 300 MG capsule Take 300 mg by mouth 3 (three) times daily.  02/15/17   [provider]  glipiZIDE (GLUCOTROL) 10 MG tablet Take 10 mg by mouth 2 (two) times daily before a meal.  12/13/16   [provider]  hydrOXYzine (ATARAX/VISTARIL) 25 MG tablet Take 25 mg by mouth every 8 (eight) hours as needed for anxiety.  04/26/18   [provider]  LEVEMIR 100 UNIT/ML injection  03/16/17   [provider]  liraglutide (VICTOZA) 18 MG/3ML SOPN Inject 1.8 mg into the skin daily.    [provider]  losartan-hydrochlorothiazide (HYZAAR) 100-12.5 MG tablet Take 1 tablet by mouth daily.  12/13/16   [provider]  Melatonin 5 MG CAPS Take 1 capsule by mouth at bedtime.    [provider]  NOVOFINE PLUS 32G X 4 MM MISC  08/17/18   [provider]  omeprazole (PRILOSEC) 40 MG capsule Take  40 mg by mouth daily.    [provider]  ondansetron (ZOFRAN) 4 MG tablet Take 4 mg by mouth daily as needed for nausea or vomiting.  07/19/18   [provider]  oxyCODONE-acetaminophen (PERCOCET) 5-325 MG tablet Take 1 tablet by mouth every 6 (six) hours as needed for severe pain. 01/09/19   Rhyne, Hulen Shouts, PA-C  sodium bicarbonate 650 MG tablet Take 650 mg by mouth 2 (two) times daily.    [provider]  traMADol (ULTRAM) 50 MG tablet Take by mouth every 6 (six) hours as needed.    [provider]     Montey Hora, [facility] Pager: [phone].  If no answer, [phone] 01/31/2019, 12:14 AM

## 2019-01-31 NOTE — Progress Notes (Signed)
PROGRESS NOTE    Josef Tourigny  BTD:[id number] DOB: [birth year] DOA: 01/30/2019 PCP: [name], MD   Brief Narrative: Leeandre Nordling is a 48 y.o. male with a history of diabetes mellitus type 2, CKD stage V, hypertension, chronic anemia, syphilis.  Patient presented secondary to confusion and found to have acute renal failure in addition to metabolic acidosis.  Plan for patient to start hemodialysis to treat kidney failure.   Assessment & Plan:   Principal Problem:   ARF (acute renal failure) (HCC) Active Problems:   Diabetes mellitus type 2 in obese (HCC)   Hyperkalemia   Hypokalemia   Hypoglycemia   Syphilis   Macrocytic anemia   Acute renal failure on CKD stage V Patient has a recently left upper extremity fistula.  Unknown etiology of acute renal failure at this time.  Patient independent with potassium 6.4, BUN of 154, creatinine of 19.78.  Renal function panel today with a phosphorus of greater than 30.  Anion gap is elevated with continued metabolic acidosis.  Potassium is improved to 5.9 after calcium gluconate, Lokelma.  Nephrology is consulted and plans for hemodialysis. -Nephrology recommendations: Interventional radiology consulted for tunneled dialysis catheter placement; hemodialysis planned for today -Avoid nephrotoxic agents -Daily weights and strict in  Acute metabolic encephalopathy In setting of acute renal failure.  Likely uremia.  Also with significant acidemia.  Will likely improve with management of renal failure and resolution of acidemia.  Acidemia secondary to metabolic acidosis In setting of severe acute renal failure.  pH on last ABG of 7.148.  Patient started on sodium bicarb fluids.  Acidosis improved on BMP but not resolved. -Repeat ABG -IV fluids per nephrology  Anasarca Secondary to renal failure and severe fluid overload.  Patient with decreased urine output as well.  Several attempts at Foley have been unsuccessful.  Unsure if  patient is retaining fluid in bladder.  Plan for hemodialysis today per nephrology  Diabetes mellitus  Patient with hypoglycemia.  Patient with lowest CBG of 64.  Started on D5 fluids with now normal blood sugars.  Patient is on glipizide, Levemir, Victoza as an outpatient. -CBG every 4 hours while n.p.o. -Sliding scale insulin every 4 hours while n.p.o. -Would recommend discontinuing glipizide as an outpatient secondary to concomitant Levemir prescription  History of syphilis Per chart review, patient treated with penicillin as an outpatient  History of left BKA Unknown history behind this.  Patient unable to provide history at this time.  Appears old and well-healed.  Essential hypertension Slightly uncontrol worsened led.  Patient is on amlodipine, Hyzaar as an outpatient. -Hydralazine IV PRN while n.p.o.  Anemia Baseline hemoglobin appears to be around 10.  Acute decreased to 7.8 today from 8.3 on admission.  Possibly related to kidney disease.  No evidence of bleeding at this time.  Leukocytosis WBC mildly elevated and currently stable.  No overt evidence of infection at this time. -Monitor  Hyperlipidemia Patient is on atorvastatin as an outpatient.  Can resume once patient is taking p.o. In the.  Morbid obesity Body mass index is 43.68 kg/m.  DVT prophylaxis: SCDs Code Status:   Code Status: Full Code Family Communication: None at bedside Disposition Plan: Discharge pending management of acute renal failure in addition to improvement of mental status   Consultants:   Nephrology  Interventional radiology  Procedures:   None  Antimicrobials:  None   Subjective: Patient repeats that he cannot pee.  Patient not very clear with his speech or his thoughts.  Objective: Vitals:   01/31/19 0204 01/31/19 0408 01/31/19 0808 01/31/19 1045  BP: (!) 144/92  (!) 156/81 (!) 156/78  Pulse:   80 80  Resp:   12 13  Temp: (!) 97.4 F (36.3 C) (!) 97.5 F (36.4 C)   98.3 F (36.8 C)  TempSrc: Oral Axillary    SpO2:   99% 98%  Weight: (!) 138.1 kg       Intake/Output Summary (Last 24 hours) at 01/31/2019 1058 Last data filed at 01/31/2019 0600 Gross per 24 hour  Intake 546.25 ml  Output -  Net 546.25 ml   Filed Weights   01/31/19 0204  Weight: (!) 138.1 kg    Examination:  General exam: Appears calm and comfortable Respiratory system: Clear to auscultation. Respiratory effort normal. Cardiovascular system: S1 & S2 heard, RRR. No murmurs, rubs, gallops or clicks. Gastrointestinal system: Abdomen is nondistended, soft and nontender.  Obese.  No organomegaly or masses felt. Normal bowel sounds heard. Central nervous system: Alert and oriented to person. No focal neurological deficits. Extremities: No calf tenderness.  2+ pitting edema up to hips bilaterally and in upper extremity Skin: No cyanosis. No rashes Psychiatry: Judgement and insight appear impaired. Mood & affect appropriate.     Data Reviewed: I have personally reviewed following labs and imaging studies  CBC: Recent Labs  Lab 01/30/19 1756 01/30/19 2058 01/30/19 2339 01/31/19 0248  WBC 14.6*  --   --  14.3*  NEUTROABS  --   --   --  11.8*  HGB 8.3* 8.5* 8.2* 7.8*  HCT 28.4* 25.0* 24.0* 24.7*  MCV 103.6*  --   --  96.9  PLT 221  --   --  884   Basic Metabolic Panel: Recent Labs  Lab 01/30/19 1756 01/30/19 2058 01/30/19 2339 01/31/19 0248  NA 136 136 136 137  K 6.4* 6.3* 6.1* 5.9*  CL 109  --   --  105  CO2 9*  --   --  15*  GLUCOSE 73  --   --  209*  BUN 154*  --   --  152*  CREATININE 19.78*  --   --  19.14*  CALCIUM 6.9*  --   --  6.7*  PHOS  --   --   --  >30.0*   GFR: Estimated Creatinine Clearance: 6.8 mL/min (A) (by C-G formula based on SCr of 19.14 mg/dL (H)). Liver Function Tests: Recent Labs  Lab 01/30/19 2042 01/31/19 0248  AST 17 16  ALT 10 11  ALKPHOS 68 67  BILITOT 0.7 0.7  PROT 6.8 6.3*  ALBUMIN 2.0* 1.9*   No results for input(s):  LIPASE, AMYLASE in the last 168 hours. Recent Labs  Lab 01/30/19 2042  AMMONIA 28   Coagulation Profile: Recent Labs  Lab 01/31/19 0248  INR 1.4*   Cardiac Enzymes: No results for input(s): CKTOTAL, CKMB, CKMBINDEX, TROPONINI in the last 168 hours. BNP (last 3 results) No results for input(s): PROBNP in the last 8760 hours. HbA1C: No results for input(s): HGBA1C in the last 72 hours. CBG: Recent Labs  Lab 01/31/19 0320 01/31/19 0503 01/31/19 0611 01/31/19 0706 01/31/19 0822  GLUCAP 187* 89 96 93 89   Lipid Profile: No results for input(s): CHOL, HDL, LDLCALC, TRIG, CHOLHDL, LDLDIRECT in the last 72 hours. Thyroid Function Tests: No results for input(s): TSH, T4TOTAL, FREET4, T3FREE, THYROIDAB in the last 72 hours. Anemia Panel: Recent Labs    01/31/19 0248  FERRITIN 284  TIBC 119*  IRON 65   Sepsis Labs: Recent Labs  Lab 01/30/19 2044 01/31/19 0248  LATICACIDVEN 0.6 0.7    Recent Results (from the past 240 hour(s))  SARS Coronavirus 2 (CEPHEID - Performed in [facility] hospital lab), Hosp Order     Status: None   Collection Time: 01/30/19  7:58 PM   Specimen: Nasopharyngeal Swab  Result Value Ref Range Status   SARS Coronavirus 2 NEGATIVE NEGATIVE Final    Comment: (NOTE) If result is NEGATIVE SARS-CoV-2 target nucleic acids are NOT DETECTED. The SARS-CoV-2 RNA is generally detectable in upper and lower  respiratory specimens during the acute phase of infection. The lowest  concentration of SARS-CoV-2 viral copies this assay can detect is 250  copies / mL. A negative result does not preclude SARS-CoV-2 infection  and should not be used as the sole basis for treatment or other  patient management decisions.  A negative result may occur with  improper specimen collection / handling, submission of specimen other  than nasopharyngeal swab, presence of viral mutation(s) within the  areas targeted by this assay, and inadequate number of viral copies  (<250  copies / mL). A negative result must be combined with clinical  observations, patient history, and epidemiological information. If result is POSITIVE SARS-CoV-2 target nucleic acids are DETECTED. The SARS-CoV-2 RNA is generally detectable in upper and lower  respiratory specimens dur ing the acute phase of infection.  Positive  results are indicative of active infection with SARS-CoV-2.  Clinical  correlation with patient history and other diagnostic information is  necessary to determine patient infection status.  Positive results do  not rule out bacterial infection or co-infection with other viruses. If result is PRESUMPTIVE POSTIVE SARS-CoV-2 nucleic acids MAY BE PRESENT.   A presumptive positive result was obtained on the submitted specimen  and confirmed on repeat testing.  While 2019 novel coronavirus  (SARS-CoV-2) nucleic acids may be present in the submitted sample  additional confirmatory testing may be necessary for epidemiological  and / or clinical management purposes  to differentiate between  SARS-CoV-2 and other Sarbecovirus currently known to infect humans.  If clinically indicated additional testing with an alternate test  methodology [phone]) is advised. The SARS-CoV-2 RNA is generally  detectable in upper and lower respiratory sp ecimens during the acute  phase of infection. The expected result is Negative. Fact Sheet for Patients:  StrictlyIdeas.no Fact Sheet for Healthcare Providers: BankingDealers.co.za This test is not yet approved or cleared by the [place] FDA and has been authorized for detection and/or diagnosis of SARS-CoV-2 by FDA under an Emergency Use Authorization (EUA).  This EUA will remain in effect (meaning this test can be used) for the duration of the COVID-19 declaration under Section 564(b)(1) of the Act, 21 U.S.C. section 360bbb-3(b)(1), unless the authorization is terminated or revoked  sooner. Performed at [facility], [place] [address], [place], [name] [address]   MRSA PCR Screening     Status: None   Collection Time: 01/31/19  3:24 AM   Specimen: Nasopharyngeal  Result Value Ref Range Status   MRSA by PCR NEGATIVE NEGATIVE Final    Comment:        The GeneXpert MRSA Assay (FDA approved for NASAL specimens only), is one component of a comprehensive MRSA colonization surveillance program. It is not intended to diagnose MRSA infection nor to guide or monitor treatment for MRSA infections. Performed at [facility], [place] [address] [place], [place] [address]  Radiology Studies: Dg Chest Port 1 View  Result Date: 01/30/2019 CLINICAL DATA:  Weakness EXAM: PORTABLE CHEST 1 VIEW COMPARISON:  December 30, 2018 FINDINGS: Cardiomegaly with vascular congestion. Mild perihilar and bibasilar opacities concerning for early edema. No effusions. No acute bony abnormality. IMPRESSION: Cardiomegaly with vascular congestion and possible early perihilar edema. Electronically Signed   By: Rolm Baptise M.D.   On: 01/30/2019 18:50   US Scrotum W/doppler  Result Date: 01/31/2019 CLINICAL DATA:  48 year old male with pain and swelling. EXAM: SCROTAL ULTRASOUND DOPPLER ULTRASOUND OF THE TESTICLES TECHNIQUE: Complete ultrasound examination of the testicles, epididymis, and other scrotal structures was performed. Color and spectral Doppler ultrasound were also utilized to evaluate blood flow to the testicles. COMPARISON:  CT Abdomen and Pelvis 07/31/2017. FINDINGS: Right testicle Measurements: 3.0 x 2.1 x 1.9 centimeters. No mass or microlithiasis visualized. Left testicle Measurements: 2.8 x 1.9 x 2.2 centimeters. No mass or microlithiasis visualized. Right epididymis:  Normal in size and appearance. Left epididymis:  Normal in size and appearance. Hydrocele:  None visualized. Varicocele:  None visualized. Pulsed Doppler interrogation of both testes demonstrates normal low  resistance arterial and venous waveforms bilaterally. Other findings: Generalized scrotal soft tissue thickening or edema. No ultrasound evidence of soft tissue gas. IMPRESSION: No evidence of testicular torsion, and negative aside from generalized scrotal soft tissue thickening/edema. Electronically Signed   By: Genevie Ann M.D.   On: 01/31/2019 00:34        Scheduled Meds: . Chlorhexidine Gluconate Cloth  6 each Topical Q0600  . darbepoetin (ARANESP) injection - DIALYSIS  60 mcg Intravenous Q Tue-HD  . lidocaine      . sodium zirconium cyclosilicate  10 g Oral Once  . sodium zirconium cyclosilicate  10 g Oral TID   Continuous Infusions: .  sodium bicarbonate  infusion 1000 mL 75 mL/hr at 01/31/19 0916     LOS: 1 day     Cordelia Poche, MD [facility] 01/31/2019, 10:58 AM  If 7PM-7AM, please contact night-coverage [email or web address]

## 2019-01-31 NOTE — ED Notes (Signed)
[place] complete, pt still groggy, responsive to touch. Awaiting new bed placement

## 2019-01-31 NOTE — Progress Notes (Signed)
Pharmacy notified to send calcium gluconate.

## 2019-02-01 LAB — CBC
HCT: 23.4 % — ABNORMAL LOW (ref 39.0–52.0)
Hemoglobin: 7.6 g/dL — ABNORMAL LOW (ref 13.0–17.0)
MCH: 30.3 pg (ref 26.0–34.0)
MCHC: 32.5 g/dL (ref 30.0–36.0)
MCV: 93.2 fL (ref 80.0–100.0)
Platelets: 190 10*3/uL (ref 150–400)
RBC: 2.51 MIL/uL — ABNORMAL LOW (ref 4.22–5.81)
RDW: 13.4 % (ref 11.5–15.5)
WBC: 15.6 10*3/uL — ABNORMAL HIGH (ref 4.0–10.5)
nRBC: 0.1 % (ref 0.0–0.2)

## 2019-02-01 LAB — GLUCOSE, CAPILLARY
Glucose-Capillary: 100 mg/dL — ABNORMAL HIGH (ref 70–99)
Glucose-Capillary: 102 mg/dL — ABNORMAL HIGH (ref 70–99)
Glucose-Capillary: 125 mg/dL — ABNORMAL HIGH (ref 70–99)
Glucose-Capillary: 93 mg/dL (ref 70–99)
Glucose-Capillary: 95 mg/dL (ref 70–99)

## 2019-02-01 LAB — RENAL FUNCTION PANEL
Albumin: 1.6 g/dL — ABNORMAL LOW (ref 3.5–5.0)
Anion gap: 15 (ref 5–15)
BUN: 122 mg/dL — ABNORMAL HIGH (ref 6–20)
CO2: 19 mmol/L — ABNORMAL LOW (ref 22–32)
Calcium: 6.2 mg/dL — CL (ref 8.9–10.3)
Chloride: 107 mmol/L (ref 98–111)
Creatinine, Ser: 16.01 mg/dL — ABNORMAL HIGH (ref 0.61–1.24)
GFR calc Af Amer: 4 mL/min — ABNORMAL LOW (ref >60)
GFR calc non Af Amer: 3 mL/min — ABNORMAL LOW (ref >60)
Glucose, Bld: 105 mg/dL — ABNORMAL HIGH (ref 70–99)
Phosphorus: 8.9 mg/dL — ABNORMAL HIGH (ref 2.5–4.6)
Potassium: 5 mmol/L (ref 3.5–5.1)
Sodium: 141 mmol/L (ref 135–145)

## 2019-02-01 LAB — HEPATITIS B SURFACE ANTIBODY,QUALITATIVE: Hep B S Ab: NONREACTIVE

## 2019-02-01 LAB — HEPATITIS B SURFACE ANTIGEN: Hepatitis B Surface Ag: NEGATIVE

## 2019-02-01 LAB — HEPATITIS B CORE ANTIBODY, TOTAL: Hep B Core Total Ab: NEGATIVE

## 2019-02-01 MED ORDER — CEFAZOLIN SODIUM-DEXTROSE 2-4 GM/100ML-% IV SOLN
2.0000 g | INTRAVENOUS | Status: AC
  Administered 2019-02-02: 2 g via INTRAVENOUS
  Filled 2019-02-01: qty 100

## 2019-02-01 MED ORDER — HEPARIN SODIUM (PORCINE) 1000 UNIT/ML IJ SOLN
INTRAMUSCULAR | Status: AC
  Administered 2019-02-01: 2600 [IU] via INTRAVENOUS_CENTRAL
  Filled 2019-02-01: qty 3

## 2019-02-01 MED ORDER — AMLODIPINE BESYLATE 10 MG PO TABS
10.0000 mg | ORAL_TABLET | Freq: Every day | ORAL | Status: DC
  Administered 2019-02-01 – 2019-02-03 (×3): 10 mg via ORAL
  Filled 2019-02-01 (×3): qty 1

## 2019-02-01 MED ORDER — ATORVASTATIN CALCIUM 40 MG PO TABS
40.0000 mg | ORAL_TABLET | Freq: Every day | ORAL | Status: DC
  Administered 2019-02-01 – 2019-02-03 (×3): 40 mg via ORAL
  Filled 2019-02-01 (×3): qty 1

## 2019-02-01 MED ORDER — CITALOPRAM HYDROBROMIDE 40 MG PO TABS
40.0000 mg | ORAL_TABLET | Freq: Every day | ORAL | Status: DC
  Administered 2019-02-01 – 2019-02-03 (×3): 40 mg via ORAL
  Filled 2019-02-01 (×4): qty 1

## 2019-02-01 NOTE — Progress Notes (Signed)
Spoke with brother and sister-in-law and provided update per patient request

## 2019-02-01 NOTE — Progress Notes (Signed)
PROGRESS NOTE    Michael Riggs  GNF:[id number] DOB: 03/03/1971 DOA: 01/30/2019 PCP: [name], MD     Brief Narrative:  Michael Riggs is a 48 year old man with history of type 2 diabetes, CKD stage V, hypertension, chronic anemia, latent syphilis.  Patient presented altered mental status, found to have acute kidney injury, metabolic acidosis.  Patient underwent temporary hemodialysis catheter placement on 7/14 and started on dialysis.  New events last 24 hours / Subjective: Patient alert and awake this morning.  Complains of being very thirsty.  States that he usually sleeps during the day instead of at nighttime.  Assessment & Plan:   Principal Problem:   ARF (acute renal failure) (HCC) Active Problems:   Diabetes mellitus type 2 in obese (HCC)   Hyperkalemia   Hypokalemia   Hypoglycemia   Syphilis   Macrocytic anemia  Acute kidney injury on CKD stage IV, now progressed to ESRD -Has recently placed left upper extremity fistula -Temporary dialysis catheter placed 7/14, started on dialysis -Outpatient dialysis established at [facility] clinic Tuesday Thursday Saturday -Per nephrology  Acute metabolic encephalopathy -Likely due to uremia -Improved  Metabolic acidosis -Started on sodium bicarb  Anasarca -Fluid management per hemodialysis  Type 2 diabetes -Hemoglobin A1c 5 -Sliding-scale insulin  History of syphilis -Treated as an outpatient with penicillin   Essential hypertension  -Amlodipine and hydralazine as needed  Hyperlipidemia -Lipitor  Morbid obesity -Body mass index is 44.79 kg/m.  Leukocytosis -No source or evidence of infection at this time.  Continue to trend CBC   DVT prophylaxis: Subcutaneous heparin Code Status: Full Family Communication: None Disposition Plan: Pending improvement   Consultants:   Nephrology  Interventional radiology  Procedures:   Right IJ dialysis catheter placed  7/14  Antimicrobials:  Anti-infectives (From admission, onward)   None       Objective: Vitals:   02/01/19 0329 02/01/19 0525 02/01/19 0755 02/01/19 1147  BP: (!) 150/81  (!) 142/78 132/75  Pulse: 80  79   Resp: 12  20 19   Temp: 98.7 F (37.1 C)  98.4 F (36.9 C) 98.6 F (37 C)  TempSrc: Oral  Oral Oral  SpO2: 98%  98% 97%  Weight:  (!) 141.6 kg      Intake/Output Summary (Last 24 hours) at 02/01/2019 1319 Last data filed at 02/01/2019 1131 Gross per 24 hour  Intake 2115.29 ml  Output 1400 ml  Net 715.29 ml   Filed Weights   01/31/19 1121 01/31/19 1344 02/01/19 0525  Weight: (!) 139.1 kg (!) 139 kg (!) 141.6 kg    Examination:  General exam: Appears calm and comfortable  Respiratory system: Clear to auscultation. Respiratory effort normal. Cardiovascular system: S1 & S2 heard, RRR. No JVD, murmurs, rubs, gallops or clicks.  Gastrointestinal system: Abdomen is nondistended, soft and nontender. No organomegaly or masses felt. Normal bowel sounds heard. Central nervous system: Alert and oriented. No focal neurological deficits. Extremities: Left BKA Skin: No rashes, lesions or ulcers Psychiatry: Judgement and insight appear stable  Data Reviewed: I have personally reviewed following labs and imaging studies  CBC: Recent Labs  Lab 01/30/19 1756 01/30/19 2058 01/30/19 2339 01/31/19 0248 01/31/19 1120 02/01/19 1002  WBC 14.6*  --   --  14.3* 14.8* 15.6*  NEUTROABS  --   --   --  11.8*  --   --   HGB 8.3* 8.5* 8.2* 7.8* 7.9* 7.6*  HCT 28.4* 25.0* 24.0* 24.7* 25.5* 23.4*  MCV 103.6*  --   --  96.9 98.1 93.2  PLT 221  --   --  218 219 892   Basic Metabolic Panel: Recent Labs  Lab 01/30/19 1756 01/30/19 2058 01/30/19 2339 01/31/19 0248 02/01/19 0718  NA 136 136 136 137 141  K 6.4* 6.3* 6.1* 5.9* 5.0  CL 109  --   --  105 107  CO2 9*  --   --  15* 19*  GLUCOSE 73  --   --  209* 105*  BUN 154*  --   --  152* 122*  CREATININE 19.78*  --   --  19.14*  16.01*  CALCIUM 6.9*  --   --  6.7* 6.2*  PHOS  --   --   --  >30.0* 8.9*   GFR: Estimated Creatinine Clearance: 8.2 mL/min (A) (by C-G formula based on SCr of 16.01 mg/dL (H)). Liver Function Tests: Recent Labs  Lab 01/30/19 2042 01/31/19 0248 02/01/19 0718  AST 17 16  --   ALT 10 11  --   ALKPHOS 68 67  --   BILITOT 0.7 0.7  --   PROT 6.8 6.3*  --   ALBUMIN 2.0* 1.9* 1.6*   No results for input(s): LIPASE, AMYLASE in the last 168 hours. Recent Labs  Lab 01/30/19 2042  AMMONIA 28   Coagulation Profile: Recent Labs  Lab 01/31/19 0248  INR 1.4*   Cardiac Enzymes: No results for input(s): CKTOTAL, CKMB, CKMBINDEX, TROPONINI in the last 168 hours. BNP (last 3 results) No results for input(s): PROBNP in the last 8760 hours. HbA1C: Recent Labs    01/31/19 1118  HGBA1C 5.0   CBG: Recent Labs  Lab 01/31/19 2029 02/01/19 0002 02/01/19 0419 02/01/19 0754 02/01/19 1146  GLUCAP 84 93 100* 95 102*   Lipid Profile: No results for input(s): CHOL, HDL, LDLCALC, TRIG, CHOLHDL, LDLDIRECT in the last 72 hours. Thyroid Function Tests: No results for input(s): TSH, T4TOTAL, FREET4, T3FREE, THYROIDAB in the last 72 hours. Anemia Panel: Recent Labs    01/31/19 0248  FERRITIN 284  TIBC 119*  IRON 65   Sepsis Labs: Recent Labs  Lab 01/30/19 2044 01/31/19 0248  LATICACIDVEN 0.6 0.7    Recent Results (from the past 240 hour(s))  SARS Coronavirus 2 (CEPHEID - Performed in [place] hospital lab), Hosp Order     Status: None   Collection Time: 01/30/19  7:58 PM   Specimen: Nasopharyngeal Swab  Result Value Ref Range Status   SARS Coronavirus 2 NEGATIVE NEGATIVE Final    Comment: (NOTE) If result is NEGATIVE SARS-CoV-2 target nucleic acids are NOT DETECTED. The SARS-CoV-2 RNA is generally detectable in upper and lower  respiratory specimens during the acute phase of infection. The lowest  concentration of SARS-CoV-2 viral copies this assay can detect is 250   copies / mL. A negative result does not preclude SARS-CoV-2 infection  and should not be used as the sole basis for treatment or other  patient management decisions.  A negative result may occur with  improper specimen collection / handling, submission of specimen other  than nasopharyngeal swab, presence of viral mutation(s) within the  areas targeted by this assay, and inadequate number of viral copies  (<250 copies / mL). A negative result must be combined with clinical  observations, patient history, and epidemiological information. If result is POSITIVE SARS-CoV-2 target nucleic acids are DETECTED. The SARS-CoV-2 RNA is generally detectable in upper and lower  respiratory specimens dur ing the acute phase of infection.  Positive  results are indicative of active infection with SARS-CoV-2.  Clinical  correlation with patient history and other diagnostic information is  necessary to determine patient infection status.  Positive results do  not rule out bacterial infection or co-infection with other viruses. If result is PRESUMPTIVE POSTIVE SARS-CoV-2 nucleic acids MAY BE PRESENT.   A presumptive positive result was obtained on the submitted specimen  and confirmed on repeat testing.  While 2019 novel coronavirus  (SARS-CoV-2) nucleic acids may be present in the submitted sample  additional confirmatory testing may be necessary for epidemiological  and / or clinical management purposes  to differentiate between  SARS-CoV-2 and other Sarbecovirus currently known to infect humans.  If clinically indicated additional testing with an alternate test  methodology [phone]) is advised. The SARS-CoV-2 RNA is generally  detectable in upper and lower respiratory sp ecimens during the acute  phase of infection. The expected result is Negative. Fact Sheet for Patients:  StrictlyIdeas.no Fact Sheet for Healthcare  Providers: BankingDealers.co.za This test is not yet approved or cleared by the [place] FDA and has been authorized for detection and/or diagnosis of SARS-CoV-2 by FDA under an Emergency Use Authorization (EUA).  This EUA will remain in effect (meaning this test can be used) for the duration of the COVID-19 declaration under Section 564(b)(1) of the Act, 21 U.S.C. section 360bbb-3(b)(1), unless the authorization is terminated or revoked sooner. Performed at [facility], [place] [address] [place], [place] [address]   MRSA PCR Screening     Status: None   Collection Time: 01/31/19  3:24 AM   Specimen: Nasopharyngeal  Result Value Ref Range Status   MRSA by PCR NEGATIVE NEGATIVE Final    Comment:        The GeneXpert MRSA Assay (FDA approved for NASAL specimens only), is one component of a comprehensive MRSA colonization surveillance program. It is not intended to diagnose MRSA infection nor to guide or monitor treatment for MRSA infections. Performed at [facility], [place] [address], [place],  [address]       Radiology Studies: US Renal  Result Date: 01/31/2019 CLINICAL DATA:  Acute renal failure. History of diabetes, hypertension, chronic kidney disease. EXAM: RENAL / URINARY TRACT ULTRASOUND COMPLETE COMPARISON:  None. FINDINGS: Right Kidney: Renal measurements: 10.7 x 6.2 x 6.2 cm = volume: 214 mL. Suboptimal visualization. Echogenicity within normal limits. No mass or hydronephrosis visualized. Left Kidney: Renal measurements: 11.7 x 7.3 x 5.8 cm = volume: 256 mL. Suboptimal visualization. Echogenicity within normal limits. No mass or hydronephrosis visualized. Bladder: Appears normal for degree of bladder distention. IMPRESSION: Suboptimal visualization of both kidneys. No gross abnormality. No hydronephrosis. Electronically Signed   By: Franki Cabot M.D.   On: 01/31/2019 15:59   Ir Fluoro Guide Cv Line Right  Result Date:  01/31/2019 CLINICAL DATA:  Renal failure, needs hemodialysis access. Elevated white blood cell count of indeterminate significance. EXAM: EXAM RIGHT IJ CATHETER PLACEMENT UNDER ULTRASOUND AND FLUOROSCOPIC GUIDANCE TECHNIQUE: The procedure, risks (including but not limited to bleeding, infection, organ damage, pneumothorax), benefits, and alternatives were explained to the family. Questions regarding the procedure were encouraged and answered. The family understands and consents to the procedure. Patency of the right IJ vein was confirmed with ultrasound with image documentation. An appropriate skin site was determined. Skin site was marked. Region was prepped using maximum barrier technique including cap and mask, sterile gown, sterile gloves, large sterile sheet, and Chlorhexidine as cutaneous antisepsis. The region was infiltrated  locally with 1% lidocaine. Under real-time ultrasound guidance, the right IJ vein was accessed with a 21 gauge needle; the needle tip within the vein was confirmed with ultrasound image documentation. The needle exchanged over a 018 guidewire for vascular dilator which allowed advancement of a 16 cm Mahurkar catheter. This was positioned with the tip at the cavoatrial junction. Spot chest radiograph shows good positioning and no pneumothorax. Catheter was flushed and sutured externally with 0-Prolene sutures. Patient tolerated the procedure well. FLUOROSCOPY TIME:  Less than 6 seconds; 10 uGym2 DAP COMPLICATIONS: COMPLICATIONS none IMPRESSION: 1. Technically successful right IJ Mahurkar catheter placement. Electronically Signed   By: Lucrezia Europe M.D.   On: 01/31/2019 12:33   Ir US Guide Vasc Access Right  Result Date: 01/31/2019 CLINICAL DATA:  Renal failure, needs hemodialysis access. Elevated white blood cell count of indeterminate significance.  EXAM: EXAM RIGHT IJ CATHETER PLACEMENT UNDER ULTRASOUND AND FLUOROSCOPIC GUIDANCE  TECHNIQUE: The procedure, risks (including but not  limited to bleeding, infection, organ damage, pneumothorax), benefits, and alternatives were explained to the family. Questions regarding the procedure were encouraged and answered. The family understands and consents to the procedure. Patency of the right IJ vein was confirmed with ultrasound with image documentation. An appropriate skin site was determined. Skin site was marked. Region was prepped using maximum barrier technique including cap and mask, sterile gown, sterile gloves, large sterile sheet, and Chlorhexidine as cutaneous antisepsis. The region was infiltrated locally with 1% lidocaine. Under real-time ultrasound guidance, the right IJ vein was accessed with a 21 gauge needle; the needle tip within the vein was confirmed with ultrasound image documentation. The needle exchanged over a 018 guidewire for vascular dilator which allowed advancement of a 16 cm Mahurkar catheter. This was positioned with the tip at the cavoatrial junction. Spot chest radiograph shows good positioning and no pneumothorax. Catheter was flushed and sutured externally with 0-Prolene sutures. Patient tolerated the procedure well.  FLUOROSCOPY TIME:  Less than 6 seconds; 10 uGym2 DAP  COMPLICATIONS: COMPLICATIONS none  IMPRESSION: 1. Technically successful right IJ Mahurkar catheter placement.   Electronically Signed   By: Lucrezia Europe M.D.   On: 01/31/2019 12:33  Dg Chest Port 1 View  Result Date: 01/30/2019 CLINICAL DATA:  Weakness EXAM: PORTABLE CHEST 1 VIEW COMPARISON:  December 30, 2018 FINDINGS: Cardiomegaly with vascular congestion. Mild perihilar and bibasilar opacities concerning for early edema. No effusions. No acute bony abnormality. IMPRESSION: Cardiomegaly with vascular congestion and possible early perihilar edema. Electronically Signed   By: Rolm Baptise M.D.   On: 01/30/2019 18:50   US Scrotum W/doppler  Result Date: 01/31/2019 CLINICAL DATA:  48 year old male with pain and swelling. EXAM: SCROTAL ULTRASOUND  DOPPLER ULTRASOUND OF THE TESTICLES TECHNIQUE: Complete ultrasound examination of the testicles, epididymis, and other scrotal structures was performed. Color and spectral Doppler ultrasound were also utilized to evaluate blood flow to the testicles. COMPARISON:  CT Abdomen and Pelvis 07/31/2017. FINDINGS: Right testicle Measurements: 3.0 x 2.1 x 1.9 centimeters. No mass or microlithiasis visualized. Left testicle Measurements: 2.8 x 1.9 x 2.2 centimeters. No mass or microlithiasis visualized. Right epididymis:  Normal in size and appearance. Left epididymis:  Normal in size and appearance. Hydrocele:  None visualized. Varicocele:  None visualized. Pulsed Doppler interrogation of both testes demonstrates normal low resistance arterial and venous waveforms bilaterally. Other findings: Generalized scrotal soft tissue thickening or edema. No ultrasound evidence of soft tissue gas. IMPRESSION: No evidence of testicular torsion, and negative aside from generalized scrotal  soft tissue thickening/edema. Electronically Signed   By: Genevie Ann M.D.   On: 01/31/2019 00:34      Scheduled Meds: . amLODipine  10 mg Oral Daily  . atorvastatin  40 mg Oral Daily  . Chlorhexidine Gluconate Cloth  6 each Topical Q0600  . citalopram  40 mg Oral Daily  . darbepoetin (ARANESP) injection - DIALYSIS  60 mcg Intravenous Q Tue-HD  . insulin aspart  0-9 Units Subcutaneous Q4H   Continuous Infusions: .  sodium bicarbonate  infusion 1000 mL 75 mL/hr at 02/01/19 0323     LOS: 2 days      Time spent: 35 minutes   Dessa Phi, DO [facility] [email or web address] 02/01/2019, 1:19 PM

## 2019-02-01 NOTE — Consult Note (Signed)
Chief Complaint: Patient was seen in consultation today for tunneled dialysis catheter placememtn Chief Complaint  Patient presents with  . Fatigue   at the request of Dr Johnney Ou    Supervising Physician: Jacqulynn Cadet  Patient Status: [facility] - In-pt  History of Present Illness: Michael Riggs is a 48 y.o. male   AMS-- uremia AVF immature (01/14/19) Temp cath placed in IR yesterday Much better today after dialysis x 2  Will need tunneled catheter placement per Nephro MD Long term dialysis   Pt is afeb; no signs of infection But Wbc trending higher From 14.6 to 15.6 today  Discussed with Dr Luna Kitchens pt has no source of infection known Will recheck cbc in am If trending down will likely be able to move ahead with tunneled catheter in IR If signes of increasing wbc May need Blood cultures  Past Medical History:  Diagnosis Date  . Anemia   . Chronic kidney disease    Stage 5  . Depression   . Diabetes mellitus without complication ([place])   . Dyspnea   . GERD (gastroesophageal reflux disease)   . Hypertension     Past Surgical History:  Procedure Laterality Date  . AV FISTULA PLACEMENT Left 01/09/2019   Procedure: ARTERIOVENOUS (AV) FISTULA CREATION LEFT UPPER ARM;  Surgeon: [name], MD;  Location: [facility] OR;  Service: Vascular;  Laterality: Left;  . below the knee amputation Left   . EYE SURGERY    . IR FLUORO GUIDE CV LINE RIGHT  01/31/2019  . IR US GUIDE VASC ACCESS RIGHT  01/31/2019    Allergies: Patient has no known allergies.  Medications: Prior to Admission medications   Medication Sig Start Date End Date Taking? Authorizing Provider  albuterol (VENTOLIN HFA) 108 (90 Base) MCG/ACT inhaler Inhale into the lungs every 6 (six) hours as needed for wheezing or shortness of breath.   Yes [provider]  amLODipine (NORVASC) 10 MG tablet Take 10 mg by mouth daily.   Yes [provider]  atorvastatin (LIPITOR) 40 MG tablet  Take 40 mg by mouth daily.  07/19/18  Yes [provider]  calcium carbonate (TUMS - DOSED IN MG ELEMENTAL CALCIUM) 500 MG chewable tablet Chew 1 tablet by mouth daily as needed for indigestion or heartburn.    Yes [provider]  citalopram (CELEXA) 40 MG tablet Take 40 mg by mouth daily.  12/13/16  Yes [provider]  doxycycline (VIBRAMYCIN) 100 MG capsule Take 1 capsule (100 mg total) by mouth 2 (two) times daily. 12/30/18  Yes Venter, Margaux, PA-C  gabapentin (NEURONTIN) 300 MG capsule Take 300 mg by mouth 3 (three) times daily.  02/15/17  Yes [provider]  glipiZIDE (GLUCOTROL) 10 MG tablet Take 10 mg by mouth 2 (two) times daily before a meal.  12/13/16  Yes [provider]  guaiFENesin-dextromethorphan (ROBITUSSIN DM) 100-10 MG/5ML syrup Take 10 mLs by mouth every 4 (four) hours as needed for cough.   Yes [provider]  hydrOXYzine (ATARAX/VISTARIL) 25 MG tablet Take 25 mg by mouth every 8 (eight) hours as needed for anxiety.  04/26/18  Yes [provider]  liraglutide (VICTOZA) 18 MG/3ML SOPN Inject 1.8 mg into the skin daily.   Yes [provider]  losartan-hydrochlorothiazide (HYZAAR) 100-12.5 MG tablet Take 1 tablet by mouth daily.  12/13/16  Yes [provider]  Melatonin 5 MG CAPS Take 1 capsule by mouth at bedtime.   Yes [provider]  NOVOFINE PLUS 32G X 4 MM MISC  08/17/18  Yes [provider]  omeprazole (PRILOSEC) 40 MG capsule Take 40 mg by mouth daily.   Yes [provider]  ondansetron (ZOFRAN) 4 MG tablet Take 4 mg by mouth daily as needed for nausea or vomiting.  07/19/18  Yes [provider]  oxyCODONE-acetaminophen (PERCOCET) 5-325 MG tablet Take 1 tablet by mouth every 6 (six) hours as needed for severe pain. 01/09/19  Yes Rhyne, Samantha J, PA-C  sodium bicarbonate 650 MG tablet Take 650 mg by mouth 2 (two) times daily.   Yes [provider]   traMADol (ULTRAM) 50 MG tablet Take by mouth every 6 (six) hours as needed.   Yes [provider]  LEVEMIR 100 UNIT/ML injection  03/16/17   [provider]     History reviewed. No pertinent family history.  Social History   Socioeconomic History  . Marital status: Divorced    Spouse name: Not on file  . Number of children: Not on file  . Years of education: Not on file  . Highest education level: Not on file  Occupational History  . Not on file  Social Needs  . Financial resource strain: Not on file  . Food insecurity    Worry: Not on file    Inability: Not on file  . Transportation needs    Medical: Not on file    Non-medical: Not on file  Tobacco Use  . Smoking status: Former Research scientist (life sciences)  . Smokeless tobacco: Never Used  Substance and Sexual Activity  . Alcohol use: Not Currently    Comment: heavy drinker in the past, none since 07/19/18  . Drug use: Not Currently  . Sexual activity: Not on file  Lifestyle  . Physical activity    Days per week: Not on file    Minutes per session: Not on file  . Stress: Not on file  Relationships  . Social Herbalist on phone: Not on file    Gets together: Not on file    Attends religious service: Not on file    Active member of club or organization: Not on file    Attends meetings of clubs or organizations: Not on file    Relationship status: Not on file  Other Topics Concern  . Not on file  Social History Narrative  . Not on file    Review of Systems: A 12 point ROS discussed and pertinent positives are indicated in the HPI above.  All other systems are negative.  Review of Systems  Constitutional: Negative for activity change, fatigue and fever.  Respiratory: Negative for cough and shortness of breath.   Cardiovascular: Negative for chest pain.  Psychiatric/Behavioral: Positive for confusion and decreased concentration.    Vital Signs: BP 132/75   Pulse 79   Temp 98.6 F (37 C) (Oral)    Resp 19   Wt (!) 312 lb 2.7 oz (141.6 kg)   SpO2 97%   BMI 44.79 kg/m   Physical Exam Vitals signs reviewed.  Cardiovascular:     Rate and Rhythm: Normal rate and regular rhythm.     Heart sounds: Normal heart sounds.  Pulmonary:     Effort: Pulmonary effort is normal.     Breath sounds: Normal breath sounds.  Abdominal:     Palpations: Abdomen is soft.  Musculoskeletal: Normal range of motion.  Skin:    General: Skin is warm and dry.  Neurological:  Mental Status: He is alert. Mental status is at baseline.  Psychiatric:     Comments: Consented with brother Michael via phone     Imaging: US Renal  Result Date: 01/31/2019 CLINICAL DATA:  Acute renal failure. History of diabetes, hypertension, chronic kidney disease. EXAM: RENAL / URINARY TRACT ULTRASOUND COMPLETE COMPARISON:  None. FINDINGS: Right Kidney: Renal measurements: 10.7 x 6.2 x 6.2 cm = volume: 214 mL. Suboptimal visualization. Echogenicity within normal limits. No mass or hydronephrosis visualized. Left Kidney: Renal measurements: 11.7 x 7.3 x 5.8 cm = volume: 256 mL. Suboptimal visualization. Echogenicity within normal limits. No mass or hydronephrosis visualized. Bladder: Appears normal for degree of bladder distention. IMPRESSION: Suboptimal visualization of both kidneys. No gross abnormality. No hydronephrosis. Electronically Signed   By: Franki Cabot M.D.   On: 01/31/2019 15:59   Ir Fluoro Guide Cv Line Right  Result Date: 01/31/2019 CLINICAL DATA:  Renal failure, needs hemodialysis access. Elevated white blood cell count of indeterminate significance. EXAM: EXAM RIGHT IJ CATHETER PLACEMENT UNDER ULTRASOUND AND FLUOROSCOPIC GUIDANCE TECHNIQUE: The procedure, risks (including but not limited to bleeding, infection, organ damage, pneumothorax), benefits, and alternatives were explained to the family. Questions regarding the procedure were encouraged and answered. The family understands and consents to the procedure.  Patency of the right IJ vein was confirmed with ultrasound with image documentation. An appropriate skin site was determined. Skin site was marked. Region was prepped using maximum barrier technique including cap and mask, sterile gown, sterile gloves, large sterile sheet, and Chlorhexidine as cutaneous antisepsis. The region was infiltrated locally with 1% lidocaine. Under real-time ultrasound guidance, the right IJ vein was accessed with a 21 gauge needle; the needle tip within the vein was confirmed with ultrasound image documentation. The needle exchanged over a 018 guidewire for vascular dilator which allowed advancement of a 16 cm Mahurkar catheter. This was positioned with the tip at the cavoatrial junction. Spot chest radiograph shows good positioning and no pneumothorax. Catheter was flushed and sutured externally with 0-Prolene sutures. Patient tolerated the procedure well. FLUOROSCOPY TIME:  Less than 6 seconds; 10 uGym2 DAP COMPLICATIONS: COMPLICATIONS none IMPRESSION: 1. Technically successful right IJ Mahurkar catheter placement. Electronically Signed   By: Lucrezia Europe M.D.   On: 01/31/2019 12:33   Ir US Guide Vasc Access Right  Result Date: 01/31/2019 CLINICAL DATA:  Renal failure, needs hemodialysis access. Elevated white blood cell count of indeterminate significance.  EXAM: EXAM RIGHT IJ CATHETER PLACEMENT UNDER ULTRASOUND AND FLUOROSCOPIC GUIDANCE  TECHNIQUE: The procedure, risks (including but not limited to bleeding, infection, organ damage, pneumothorax), benefits, and alternatives were explained to the family. Questions regarding the procedure were encouraged and answered. The family understands and consents to the procedure. Patency of the right IJ vein was confirmed with ultrasound with image documentation. An appropriate skin site was determined. Skin site was marked. Region was prepped using maximum barrier technique including cap and mask, sterile gown, sterile gloves, large sterile  sheet, and Chlorhexidine as cutaneous antisepsis. The region was infiltrated locally with 1% lidocaine. Under real-time ultrasound guidance, the right IJ vein was accessed with a 21 gauge needle; the needle tip within the vein was confirmed with ultrasound image documentation. The needle exchanged over a 018 guidewire for vascular dilator which allowed advancement of a 16 cm Mahurkar catheter. This was positioned with the tip at the cavoatrial junction. Spot chest radiograph shows good positioning and no pneumothorax. Catheter was flushed and sutured externally with 0-Prolene sutures. Patient tolerated the procedure  well.  FLUOROSCOPY TIME:  Less than 6 seconds; 10 uGym2 DAP  COMPLICATIONS: COMPLICATIONS none  IMPRESSION: 1. Technically successful right IJ Mahurkar catheter placement.   Electronically Signed   By: Lucrezia Europe M.D.   On: 01/31/2019 12:33  Dg Chest Port 1 View  Result Date: 01/30/2019 CLINICAL DATA:  Weakness EXAM: PORTABLE CHEST 1 VIEW COMPARISON:  December 30, 2018 FINDINGS: Cardiomegaly with vascular congestion. Mild perihilar and bibasilar opacities concerning for early edema. No effusions. No acute bony abnormality. IMPRESSION: Cardiomegaly with vascular congestion and possible early perihilar edema. Electronically Signed   By: Rolm Baptise M.D.   On: 01/30/2019 18:50   Vas [place] Upper Extremity Arterial Duplex  Result Date: 01/03/2019 UPPER EXTREMITY DUPLEX STUDY Indications: Evaluation prior to placement of AVF.  Risk Factors: Hypertension, Diabetes, no history of smoking. Performing Technologist: Delorise Shiner RVT  Examination Guidelines: A complete evaluation includes B-mode imaging, spectral Doppler, color Doppler, and power Doppler as needed of all accessible portions of each vessel. Bilateral testing is considered an integral part of a complete examination. Limited examinations for reoccurring indications may be performed as noted.  Right Pre-Dialysis Findings:  +-----------------------+----------+--------------------+---------+--------+ Location               PSV (cm/s)Intralum. Diam. (cm)Waveform Comments +-----------------------+----------+--------------------+---------+--------+ Brachial Antecub. fossa64        0.61                triphasic         +-----------------------+----------+--------------------+---------+--------+ Radial Art at Wrist    62        0.23                triphasic         +-----------------------+----------+--------------------+---------+--------+ Ulnar Art at Wrist     68        0.23                triphasic         +-----------------------+----------+--------------------+---------+--------+ Left Pre-Dialysis Findings: +-----------------------+----------+--------------------+---------+--------+ Location               PSV (cm/s)Intralum. Diam. (cm)Waveform Comments +-----------------------+----------+--------------------+---------+--------+ Brachial Antecub. fossa67        0.61                triphasic         +-----------------------+----------+--------------------+---------+--------+ Radial Art at Wrist    65        0.23                triphasic         +-----------------------+----------+--------------------+---------+--------+ Ulnar Art at Wrist     94        0.30                triphasic         +-----------------------+----------+--------------------+---------+--------+  Summary:  Right: No obstruction visualized in the right upper extremity. Left: No obstruction visualized in the left upper extremity. *See table(s) above for measurements and observations. Electronically signed by [name] MD on 01/03/2019 at 4:07:19 PM.    Final    US Scrotum W/doppler  Result Date: 01/31/2019 CLINICAL DATA:  48 year old male with pain and swelling. EXAM: SCROTAL ULTRASOUND DOPPLER ULTRASOUND OF THE TESTICLES TECHNIQUE: Complete ultrasound examination of the testicles, epididymis, and other scrotal  structures was performed. Color and spectral Doppler ultrasound were also utilized to evaluate blood flow to the testicles. COMPARISON:  CT Abdomen and Pelvis 07/31/2017. FINDINGS: Right testicle Measurements: 3.0 x 2.1 x 1.9 centimeters.  No mass or microlithiasis visualized. Left testicle Measurements: 2.8 x 1.9 x 2.2 centimeters. No mass or microlithiasis visualized. Right epididymis:  Normal in size and appearance. Left epididymis:  Normal in size and appearance. Hydrocele:  None visualized. Varicocele:  None visualized. Pulsed Doppler interrogation of both testes demonstrates normal low resistance arterial and venous waveforms bilaterally. Other findings: Generalized scrotal soft tissue thickening or edema. No ultrasound evidence of soft tissue gas. IMPRESSION: No evidence of testicular torsion, and negative aside from generalized scrotal soft tissue thickening/edema. Electronically Signed   By: Genevie Ann M.D.   On: 01/31/2019 00:34   Vas [place] Upper Extremity Vein Mapping  Result Date: 01/03/2019 UPPER EXTREMITY VEIN MAPPING  Indications: Pre-access. History: Evaluation prior to placement of AVF.  Performing Technologist: Delorise Shiner RVT  Examination Guidelines: A complete evaluation includes B-mode imaging, spectral Doppler, color Doppler, and power Doppler as needed of all accessible portions of each vessel. Bilateral testing is considered an integral part of a complete examination. Limited examinations for reoccurring indications may be performed as noted. +-----------------+-------------+----------+--------+ Right Cephalic   Diameter (cm)Depth (cm)Findings +-----------------+-------------+----------+--------+ Shoulder             0.49                        +-----------------+-------------+----------+--------+ Prox upper arm       0.39                        +-----------------+-------------+----------+--------+ Mid upper arm        0.30                         +-----------------+-------------+----------+--------+ Dist upper arm       0.39                        +-----------------+-------------+----------+--------+ Antecubital fossa    0.62                        +-----------------+-------------+----------+--------+ Prox forearm         0.31                        +-----------------+-------------+----------+--------+ Mid forearm          0.35                        +-----------------+-------------+----------+--------+ Dist forearm         0.25                        +-----------------+-------------+----------+--------+ +-----------------+-------------+----------+--------+ Right Basilic    Diameter (cm)Depth (cm)Findings +-----------------+-------------+----------+--------+ Prox upper arm       0.64                        +-----------------+-------------+----------+--------+ Mid upper arm        0.39                        +-----------------+-------------+----------+--------+ Dist upper arm       0.37                        +-----------------+-------------+----------+--------+ Antecubital fossa    0.31                        +-----------------+-------------+----------+--------+  Prox forearm         0.29                        +-----------------+-------------+----------+--------+ +-----------------+-------------+----------+--------+ Left Cephalic    Diameter (cm)Depth (cm)Findings +-----------------+-------------+----------+--------+ Shoulder             0.32                        +-----------------+-------------+----------+--------+ Prox upper arm       0.25                        +-----------------+-------------+----------+--------+ Mid upper arm        0.22                        +-----------------+-------------+----------+--------+ Dist upper arm       0.21                        +-----------------+-------------+----------+--------+ Antecubital fossa    0.25                         +-----------------+-------------+----------+--------+ Prox forearm         0.25                        +-----------------+-------------+----------+--------+ Mid forearm          0.28                        +-----------------+-------------+----------+--------+ Dist forearm         0.34                        +-----------------+-------------+----------+--------+ +-----------------+-------------+----------+--------+ Left Basilic     Diameter (cm)Depth (cm)Findings +-----------------+-------------+----------+--------+ Prox upper arm       0.43                        +-----------------+-------------+----------+--------+ Mid upper arm        0.50                        +-----------------+-------------+----------+--------+ Dist upper arm       0.57                        +-----------------+-------------+----------+--------+ Antecubital fossa    0.27                        +-----------------+-------------+----------+--------+ There appears to be chronic, 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 Summary: Right: Patent cephalic and basilic veins with diameters as described        above. Left: Patent cephalic vein with diameters as described above. The       basilic vein appears to have chronic, non occlusive thrombus       in distal upper arm/ antecubital fossa segment. *See table(s) above for measurements and observations.  Diagnosing physician: [name] MD Electronically signed by [name] MD on 01/03/2019 at 4:29:13 PM.    Final     Labs:  CBC: Recent Labs    01/30/19 1756  01/30/19 2339 01/31/19 0248 01/31/19 1120 02/01/19 1002  WBC 14.6*  --   --  14.3* 14.8* 15.6*  HGB 8.3*   < > 8.2* 7.8* 7.9* 7.6*  HCT 28.4*   < > 24.0* 24.7* 25.5* 23.4*  PLT 221  --   --  218 219 190   < > = values in this interval not displayed.    COAGS: Recent Labs    01/31/19 0248  INR 1.4*    BMP: Recent Labs     12/30/18 1602 01/09/19 0948 01/30/19 1756 01/30/19 2058 01/30/19 2339 01/31/19 0248 02/01/19 0718  NA 138 140 136 136 136 137 141  K 4.3 4.4 6.4* 6.3* 6.1* 5.9* 5.0  CL 111  --  109  --   --  105 107  CO2 15*  --  9*  --   --  15* 19*  GLUCOSE 124* 76 73  --   --  209* 105*  BUN 65*  --  154*  --   --  152* 122*  CALCIUM 7.5*  --  6.9*  --   --  6.7* 6.2*  CREATININE 9.32*  --  19.78*  --   --  19.14* 16.01*  GFRNONAA 6*  --  2*  --   --  3* 3*  GFRAA 7*  --  3*  --   --  3* 4*    LIVER FUNCTION TESTS: Recent Labs    01/30/19 2042 01/31/19 0248 02/01/19 0718  BILITOT 0.7 0.7  --   AST 17 16  --   ALT 10 11  --   ALKPHOS 68 67  --   PROT 6.8 6.3*  --   ALBUMIN 2.0* 1.9* 1.6*    TUMOR MARKERS: No results for input(s): AFPTM, CEA, CA199, CHROMGRNA in the last 8760 hours.  Assessment and Plan:  Renal failure Temp cath placed in IR yesterday Has improved uremia with dialysis x 2 Now requesting tunneled catheter  Placement Plan for 7/16 in IR Will recheck wbc in am-- need to be trending down Afeb  Risks and benefits discussed with the patient's brother Michael via phone including, but not limited to bleeding, infection, vascular injury, pneumothorax which may require chest tube placement, air embolism or even death  All  questions were answered, brother  is agreeable to proceed. Conf the patient'ssent signed and in chart.   Thank you for this interesting consult.  I greatly enjoyed meeting Michael Riggs and look forward to participating in their care.  A copy of this report was sent to the requesting provider on this date.  Electronically Signed: Lavonia Drafts, PA-C 02/01/2019, 3:11 PM   I spent a total of 20 Minutes    in face to face in clinical consultation, greater than 50% of which was counseling/coordinating care for tunneled HD

## 2019-02-01 NOTE — Progress Notes (Signed)
[facility] Progress Note   Subjective:   S/p 1st HD yesterday - remained confused yesterday.  This morning much improved. On for 2nd HD today.   Objective Vitals:   02/01/19 0329 02/01/19 0525 02/01/19 0755 02/01/19 1147  BP: (!) 150/81  (!) 142/78 132/75  Pulse: 80  79   Resp: 12  20 19   Temp: 98.7 F (37.1 C)  98.4 F (36.9 C) 98.6 F (37 C)  TempSrc: Oral  Oral Oral  SpO2: 98%  98% 97%  Weight:  (!) 141.6 kg     Physical Exam General: lying flat in bed, arousable and conversant this morning Heart: RRR, no rub Lungs: clear ant Abdomen: soft, obese Extremities: no edema Dialysis Access:  R IJ temp cath  Additional Objective Labs: Basic Metabolic Panel: Recent Labs  Lab 01/30/19 1756  01/30/19 2339 01/31/19 0248 02/01/19 0718  NA 136   < > 136 137 141  K 6.4*   < > 6.1* 5.9* 5.0  CL 109  --   --  105 107  CO2 9*  --   --  15* 19*  GLUCOSE 73  --   --  209* 105*  BUN 154*  --   --  152* 122*  CREATININE 19.78*  --   --  19.14* 16.01*  CALCIUM 6.9*  --   --  6.7* 6.2*  PHOS  --   --   --  >30.0* 8.9*   < > = values in this interval not displayed.   Liver Function Tests: Recent Labs  Lab 01/30/19 2042 01/31/19 0248 02/01/19 0718  AST 17 16  --   ALT 10 11  --   ALKPHOS 68 67  --   BILITOT 0.7 0.7  --   PROT 6.8 6.3*  --   ALBUMIN 2.0* 1.9* 1.6*   No results for input(s): LIPASE, AMYLASE in the last 168 hours. CBC: Recent Labs  Lab 01/30/19 1756  01/31/19 0248 01/31/19 1120 02/01/19 1002  WBC 14.6*  --  14.3* 14.8* 15.6*  NEUTROABS  --   --  11.8*  --   --   HGB 8.3*   < > 7.8* 7.9* 7.6*  HCT 28.4*   < > 24.7* 25.5* 23.4*  MCV 103.6*  --  96.9 98.1 93.2  PLT 221  --  218 219 190   < > = values in this interval not displayed.   Blood Culture No results found for: SDES, SPECREQUEST, CULT, REPTSTATUS  Cardiac Enzymes: No results for input(s): CKTOTAL, CKMB, CKMBINDEX, TROPONINI in the last 168 hours. CBG: Recent Labs  Lab  01/31/19 2029 02/01/19 0002 02/01/19 0419 02/01/19 0754 02/01/19 1146  GLUCAP 84 93 100* 95 102*   Iron Studies:  Recent Labs    01/31/19 0248  IRON 65  TIBC 119*  FERRITIN 284   @[email or web address] @ Studies/Results: US Renal  Result Date: 01/31/2019 CLINICAL DATA:  Acute renal failure. History of diabetes, hypertension, chronic kidney disease. EXAM: RENAL / URINARY TRACT ULTRASOUND COMPLETE COMPARISON:  None. FINDINGS: Right Kidney: Renal measurements: 10.7 x 6.2 x 6.2 cm = volume: 214 mL. Suboptimal visualization. Echogenicity within normal limits. No mass or hydronephrosis visualized. Left Kidney: Renal measurements: 11.7 x 7.3 x 5.8 cm = volume: 256 mL. Suboptimal visualization. Echogenicity within normal limits. No mass or hydronephrosis visualized. Bladder: Appears normal for degree of bladder distention. IMPRESSION: Suboptimal visualization of both kidneys. No gross abnormality. No hydronephrosis. Electronically Signed   By: [name].D.  On: 01/31/2019 15:59   Ir Fluoro Guide Cv Line Right  Result Date: 01/31/2019 CLINICAL DATA:  Renal failure, needs hemodialysis access. Elevated white blood cell count of indeterminate significance. EXAM: EXAM RIGHT IJ CATHETER PLACEMENT UNDER ULTRASOUND AND FLUOROSCOPIC GUIDANCE TECHNIQUE: The procedure, risks (including but not limited to bleeding, infection, organ damage, pneumothorax), benefits, and alternatives were explained to the family. Questions regarding the procedure were encouraged and answered. The family understands and consents to the procedure. Patency of the right IJ vein was confirmed with ultrasound with image documentation. An appropriate skin site was determined. Skin site was marked. Region was prepped using maximum barrier technique including cap and mask, sterile gown, sterile gloves, large sterile sheet, and Chlorhexidine as cutaneous antisepsis. The region was infiltrated locally with 1% lidocaine. Under real-time ultrasound  guidance, the right IJ vein was accessed with a 21 gauge needle; the needle tip within the vein was confirmed with ultrasound image documentation. The needle exchanged over a 018 guidewire for vascular dilator which allowed advancement of a 16 cm Mahurkar catheter. This was positioned with the tip at the cavoatrial junction. Spot chest radiograph shows good positioning and no pneumothorax. Catheter was flushed and sutured externally with 0-Prolene sutures. Patient tolerated the procedure well. FLUOROSCOPY TIME:  Less than 6 seconds; 10 uGym2 DAP COMPLICATIONS: COMPLICATIONS none IMPRESSION: 1. Technically successful right IJ Mahurkar catheter placement. Electronically Signed   By: Lucrezia Europe M.D.   On: 01/31/2019 12:33   Ir US Guide Vasc Access Right  Result Date: 01/31/2019 CLINICAL DATA:  Renal failure, needs hemodialysis access. Elevated white blood cell count of indeterminate significance.  EXAM: EXAM RIGHT IJ CATHETER PLACEMENT UNDER ULTRASOUND AND FLUOROSCOPIC GUIDANCE  TECHNIQUE: The procedure, risks (including but not limited to bleeding, infection, organ damage, pneumothorax), benefits, and alternatives were explained to the family. Questions regarding the procedure were encouraged and answered. The family understands and consents to the procedure. Patency of the right IJ vein was confirmed with ultrasound with image documentation. An appropriate skin site was determined. Skin site was marked. Region was prepped using maximum barrier technique including cap and mask, sterile gown, sterile gloves, large sterile sheet, and Chlorhexidine as cutaneous antisepsis. The region was infiltrated locally with 1% lidocaine. Under real-time ultrasound guidance, the right IJ vein was accessed with a 21 gauge needle; the needle tip within the vein was confirmed with ultrasound image documentation. The needle exchanged over a 018 guidewire for vascular dilator which allowed advancement of a 16 cm Mahurkar catheter.  This was positioned with the tip at the cavoatrial junction. Spot chest radiograph shows good positioning and no pneumothorax. Catheter was flushed and sutured externally with 0-Prolene sutures. Patient tolerated the procedure well.  FLUOROSCOPY TIME:  Less than 6 seconds; 10 uGym2 DAP  COMPLICATIONS: COMPLICATIONS none  IMPRESSION: 1. Technically successful right IJ Mahurkar catheter placement.   Electronically Signed   By: Lucrezia Europe M.D.   On: 01/31/2019 12:33  Dg Chest Port 1 View  Result Date: 01/30/2019 CLINICAL DATA:  Weakness EXAM: PORTABLE CHEST 1 VIEW COMPARISON:  December 30, 2018 FINDINGS: Cardiomegaly with vascular congestion. Mild perihilar and bibasilar opacities concerning for early edema. No effusions. No acute bony abnormality. IMPRESSION: Cardiomegaly with vascular congestion and possible early perihilar edema. Electronically Signed   By: Rolm Baptise M.D.   On: 01/30/2019 18:50   US Scrotum W/doppler  Result Date: 01/31/2019 CLINICAL DATA:  48 year old male with pain and swelling. EXAM: SCROTAL ULTRASOUND DOPPLER ULTRASOUND OF THE TESTICLES TECHNIQUE: Complete  ultrasound examination of the testicles, epididymis, and other scrotal structures was performed. Color and spectral Doppler ultrasound were also utilized to evaluate blood flow to the testicles. COMPARISON:  CT Abdomen and Pelvis 07/31/2017. FINDINGS: Right testicle Measurements: 3.0 x 2.1 x 1.9 centimeters. No mass or microlithiasis visualized. Left testicle Measurements: 2.8 x 1.9 x 2.2 centimeters. No mass or microlithiasis visualized. Right epididymis:  Normal in size and appearance. Left epididymis:  Normal in size and appearance. Hydrocele:  None visualized. Varicocele:  None visualized. Pulsed Doppler interrogation of both testes demonstrates normal low resistance arterial and venous waveforms bilaterally. Other findings: Generalized scrotal soft tissue thickening or edema. No ultrasound evidence of soft tissue gas.  IMPRESSION: No evidence of testicular torsion, and negative aside from generalized scrotal soft tissue thickening/edema. Electronically Signed   By: Genevie Ann M.D.   On: 01/31/2019 00:34   Medications: .  sodium bicarbonate  infusion 1000 mL 75 mL/hr at 02/01/19 0323   . amLODipine  10 mg Oral Daily  . atorvastatin  40 mg Oral Daily  . Chlorhexidine Gluconate Cloth  6 each Topical Q0600  . citalopram  40 mg Oral Daily  . darbepoetin (ARANESP) injection - DIALYSIS  60 mcg Intravenous Q Tue-HD  . insulin aspart  0-9 Units Subcutaneous Q4H    Assessment/Plan **AMS secondary to uremia:  AVF immature so s/p temp HD catheter placement - plan HD 1 7/15, 2nd today. No UF.  Will need TDC conversion - ordered, NPOpMN in prep.  CLIP to [place] - TTS 7am spot.   **Hyperkalemia: resolved with HD.   **AGMA:  resolving with dialysis. Stop bicarb.   **Anemia:  Hb 8.3 > 7.8 > 7.6.  Initiate ESA while admitted - 58mcg qTues.  Iron replete 7/14.  **DM:  H/o poor control but in setting of worsening renal function recent A1cs have been ok.  Hypoglycemic on presentation.  Per primary.    **HTN:  Normotensive now.  Home med amlodipine 10.  Continue.    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  Will need to track down health dept records --> asked my office staff to call.  Unclear to me that he truly went to visit.   **Urinary retention:  At admission unable to void and bladder scan 53mL - foley.  TOV prior to discharge > will remove foley and check PVRs tomorrow to allow another HD treatment to clear uremia a bit more.   Jannifer Hick MD 02/01/2019, 1:50 PM  [facility] Pager: [phone]

## 2019-02-01 NOTE — Progress Notes (Signed)
Patient has been accepted at [facility] for OP HD treatment on a TTS scheduled with a seat time of 7:00am.  If possible, reporting to the clinic the day prior to starting to complete intake paperwork will be best, but Renal Navigator will follow patient through hospitalization until discharge to help coordinate.  Renal Navigator notified Nephrologist/Dr. Johnney Ou of patient's OP HD schedule as well as patient's family. Patient's family stated understanding and appreciation.  Alphonzo Cruise, Deputy Renal Navigator [phone]

## 2019-02-01 NOTE — Progress Notes (Signed)
CRITICAL VALUE ALERT  Critical Value:  Calcium 6.2  Date & Time Notied:  02/01/19 at 0853  Provider Notified: Dr. Maylene Roes  Orders Received/Actions taken: patient should go to dialysis today

## 2019-02-02 LAB — RENAL FUNCTION PANEL
Albumin: 1.6 g/dL — ABNORMAL LOW (ref 3.5–5.0)
Anion gap: 15 (ref 5–15)
BUN: 84 mg/dL — ABNORMAL HIGH (ref 6–20)
CO2: 20 mmol/L — ABNORMAL LOW (ref 22–32)
Calcium: 6.1 mg/dL — CL (ref 8.9–10.3)
Chloride: 102 mmol/L (ref 98–111)
Creatinine, Ser: 11.77 mg/dL — ABNORMAL HIGH (ref 0.61–1.24)
GFR calc Af Amer: 5 mL/min — ABNORMAL LOW (ref >60)
GFR calc non Af Amer: 5 mL/min — ABNORMAL LOW (ref >60)
Glucose, Bld: 116 mg/dL — ABNORMAL HIGH (ref 70–99)
Phosphorus: 6.1 mg/dL — ABNORMAL HIGH (ref 2.5–4.6)
Potassium: 4.1 mmol/L (ref 3.5–5.1)
Sodium: 137 mmol/L (ref 135–145)

## 2019-02-02 LAB — CBC
HCT: 23.3 % — ABNORMAL LOW (ref 39.0–52.0)
Hemoglobin: 7.5 g/dL — ABNORMAL LOW (ref 13.0–17.0)
MCH: 30 pg (ref 26.0–34.0)
MCHC: 32.2 g/dL (ref 30.0–36.0)
MCV: 93.2 fL (ref 80.0–100.0)
Platelets: 201 10*3/uL (ref 150–400)
RBC: 2.5 MIL/uL — ABNORMAL LOW (ref 4.22–5.81)
RDW: 13.4 % (ref 11.5–15.5)
WBC: 14.9 10*3/uL — ABNORMAL HIGH (ref 4.0–10.5)
nRBC: 0.1 % (ref 0.0–0.2)

## 2019-02-02 LAB — GLUCOSE, CAPILLARY
Glucose-Capillary: 104 mg/dL — ABNORMAL HIGH (ref 70–99)
Glucose-Capillary: 116 mg/dL — ABNORMAL HIGH (ref 70–99)
Glucose-Capillary: 124 mg/dL — ABNORMAL HIGH (ref 70–99)
Glucose-Capillary: 44 mg/dL — CL (ref 70–99)
Glucose-Capillary: 90 mg/dL (ref 70–99)
Glucose-Capillary: 97 mg/dL (ref 70–99)

## 2019-02-02 MED ORDER — CHLORHEXIDINE GLUCONATE CLOTH 2 % EX PADS: 6.0000 | MEDICATED_PAD | Freq: Every day | CUTANEOUS | Status: DC

## 2019-02-02 MED ORDER — HEPARIN SODIUM (PORCINE) 1000 UNIT/ML IJ SOLN
INTRAMUSCULAR | Status: AC
  Filled 2019-02-02: qty 3

## 2019-02-02 MED ORDER — DEXTROSE 50 % IV SOLN
INTRAVENOUS | Status: AC
  Administered 2019-02-02: 25 mL
  Filled 2019-02-02: qty 50

## 2019-02-02 MED ORDER — NON FORMULARY: 6.0000 mg | Freq: Once | Status: DC

## 2019-02-02 MED ORDER — CALCIUM CARBONATE ANTACID 500 MG PO CHEW
1.0000 | CHEWABLE_TABLET | Freq: Two times a day (BID) | ORAL | Status: DC
  Administered 2019-02-02 – 2019-02-03 (×3): 200 mg via ORAL
  Filled 2019-02-02 (×3): qty 1

## 2019-02-02 MED ORDER — MELATONIN 3 MG PO TABS
6.0000 mg | ORAL_TABLET | Freq: Every day | ORAL | Status: DC
  Administered 2019-02-02: 6 mg via ORAL
  Filled 2019-02-02 (×2): qty 2

## 2019-02-02 MED ORDER — TRAMADOL HCL 50 MG PO TABS
50.0000 mg | ORAL_TABLET | Freq: Once | ORAL | Status: AC
  Administered 2019-02-02: 50 mg via ORAL
  Filled 2019-02-02: qty 1

## 2019-02-02 MED ORDER — BISACODYL 5 MG PO TBEC
5.0000 mg | DELAYED_RELEASE_TABLET | Freq: Every day | ORAL | Status: DC | PRN
  Administered 2019-02-02: 5 mg via ORAL
  Filled 2019-02-02: qty 1

## 2019-02-02 MED ORDER — CALCITRIOL 0.25 MCG PO CAPS
0.2500 ug | ORAL_CAPSULE | Freq: Every day | ORAL | Status: DC
  Administered 2019-02-02 – 2019-02-03 (×2): 0.25 ug via ORAL
  Filled 2019-02-02 (×2): qty 1

## 2019-02-02 NOTE — Progress Notes (Addendum)
[facility] Progress Note   Subjective:   S/p 2nd HD yesterday - no issues.  Feels ok this AM.  Anxious to go home.  NPO for [facility] placement today.  Remains afebrile.  Objective Vitals:   02/01/19 1858 02/01/19 2226 02/02/19 0412 02/02/19 0533  BP: 136/73 123/67 (!) 146/75   Pulse: 83 83 83   Resp: 12  18   Temp: 98.7 F (37.1 C) 98.7 F (37.1 C) 98.3 F (36.8 C)   TempSrc: Oral Oral Oral   SpO2: 97% 96% 98%   Weight: (!) 140.4 kg   (!) 141.1 kg   Physical Exam General: lying flat in bed, normally conversant this AM Heart: RRR, no rub Lungs: clear ant Abdomen: soft, obese Extremities: no edema, LUE AVF thrill and bruit Dialysis Access:  R IJ temp cath  Additional Objective Labs: Basic Metabolic Panel: Recent Labs  Lab 01/31/19 0248 02/01/19 0718 02/02/19 0322  NA 137 141 137  K 5.9* 5.0 4.1  CL 105 107 102  CO2 15* 19* 20*  GLUCOSE 209* 105* 116*  BUN 152* 122* 84*  CREATININE 19.14* 16.01* 11.77*  CALCIUM 6.7* 6.2* 6.1*  PHOS >30.0* 8.9* 6.1*   Liver Function Tests: Recent Labs  Lab 01/30/19 2042 01/31/19 0248 02/01/19 0718 02/02/19 0322  AST 17 16  --   --   ALT 10 11  --   --   ALKPHOS 68 67  --   --   BILITOT 0.7 0.7  --   --   PROT 6.8 6.3*  --   --   ALBUMIN 2.0* 1.9* 1.6* 1.6*   No results for input(s): LIPASE, AMYLASE in the last 168 hours. CBC: Recent Labs  Lab 01/30/19 1756  01/31/19 0248 01/31/19 1120 02/01/19 1002 02/02/19 0322  WBC 14.6*  --  14.3* 14.8* 15.6* 14.9*  NEUTROABS  --   --  11.8*  --   --   --   HGB 8.3*   < > 7.8* 7.9* 7.6* 7.5*  HCT 28.4*   < > 24.7* 25.5* 23.4* 23.3*  MCV 103.6*  --  96.9 98.1 93.2 93.2  PLT 221  --  218 219 190 201   < > = values in this interval not displayed.   Blood Culture No results found for: SDES, SPECREQUEST, CULT, REPTSTATUS  Cardiac Enzymes: No results for input(s): CKTOTAL, CKMB, CKMBINDEX, TROPONINI in the last 168 hours. CBG: Recent Labs  Lab 02/01/19 2057  02/02/19 0008 02/02/19 0409 02/02/19 0756 02/02/19 0846  GLUCAP 125* 124* 104* 44* 116*   Iron Studies:  Recent Labs    01/31/19 0248  IRON 65  TIBC 119*  FERRITIN 284   @[email or web address] @ Studies/Results: US Renal  Result Date: 01/31/2019 CLINICAL DATA:  Acute renal failure. History of diabetes, hypertension, chronic kidney disease. EXAM: RENAL / URINARY TRACT ULTRASOUND COMPLETE COMPARISON:  None. FINDINGS: Right Kidney: Renal measurements: 10.7 x 6.2 x 6.2 cm = volume: 214 mL. Suboptimal visualization. Echogenicity within normal limits. No mass or hydronephrosis visualized. Left Kidney: Renal measurements: 11.7 x 7.3 x 5.8 cm = volume: 256 mL. Suboptimal visualization. Echogenicity within normal limits. No mass or hydronephrosis visualized. Bladder: Appears normal for degree of bladder distention. IMPRESSION: Suboptimal visualization of both kidneys. No gross abnormality. No hydronephrosis. Electronically Signed   By: Franki Cabot M.D.   On: 01/31/2019 15:59   Ir Fluoro Guide Cv Line Right  Result Date: 01/31/2019 CLINICAL DATA:  Renal failure, needs hemodialysis access. Elevated white blood  cell count of indeterminate significance. EXAM: EXAM RIGHT IJ CATHETER PLACEMENT UNDER ULTRASOUND AND FLUOROSCOPIC GUIDANCE TECHNIQUE: The procedure, risks (including but not limited to bleeding, infection, organ damage, pneumothorax), benefits, and alternatives were explained to the family. Questions regarding the procedure were encouraged and answered. The family understands and consents to the procedure. Patency of the right IJ vein was confirmed with ultrasound with image documentation. An appropriate skin site was determined. Skin site was marked. Region was prepped using maximum barrier technique including cap and mask, sterile gown, sterile gloves, large sterile sheet, and Chlorhexidine as cutaneous antisepsis. The region was infiltrated locally with 1% lidocaine. Under real-time ultrasound guidance,  the right IJ vein was accessed with a 21 gauge needle; the needle tip within the vein was confirmed with ultrasound image documentation. The needle exchanged over a 018 guidewire for vascular dilator which allowed advancement of a 16 cm Mahurkar catheter. This was positioned with the tip at the cavoatrial junction. Spot chest radiograph shows good positioning and no pneumothorax. Catheter was flushed and sutured externally with 0-Prolene sutures. Patient tolerated the procedure well. FLUOROSCOPY TIME:  Less than 6 seconds; 10 uGym2 DAP COMPLICATIONS: COMPLICATIONS none IMPRESSION: 1. Technically successful right IJ Mahurkar catheter placement. Electronically Signed   By: Lucrezia Europe M.D.   On: 01/31/2019 12:33   Ir US Guide Vasc Access Right  Result Date: 01/31/2019 CLINICAL DATA:  Renal failure, needs hemodialysis access. Elevated white blood cell count of indeterminate significance.  EXAM: EXAM RIGHT IJ CATHETER PLACEMENT UNDER ULTRASOUND AND FLUOROSCOPIC GUIDANCE  TECHNIQUE: The procedure, risks (including but not limited to bleeding, infection, organ damage, pneumothorax), benefits, and alternatives were explained to the family. Questions regarding the procedure were encouraged and answered. The family understands and consents to the procedure. Patency of the right IJ vein was confirmed with ultrasound with image documentation. An appropriate skin site was determined. Skin site was marked. Region was prepped using maximum barrier technique including cap and mask, sterile gown, sterile gloves, large sterile sheet, and Chlorhexidine as cutaneous antisepsis. The region was infiltrated locally with 1% lidocaine. Under real-time ultrasound guidance, the right IJ vein was accessed with a 21 gauge needle; the needle tip within the vein was confirmed with ultrasound image documentation. The needle exchanged over a 018 guidewire for vascular dilator which allowed advancement of a 16 cm Mahurkar catheter. This was  positioned with the tip at the cavoatrial junction. Spot chest radiograph shows good positioning and no pneumothorax. Catheter was flushed and sutured externally with 0-Prolene sutures. Patient tolerated the procedure well.  FLUOROSCOPY TIME:  Less than 6 seconds; 10 uGym2 DAP  COMPLICATIONS: COMPLICATIONS none  IMPRESSION: 1. Technically successful right IJ Mahurkar catheter placement.   Electronically Signed   By: Lucrezia Europe M.D.   On: 01/31/2019 12:33  Medications:  . amLODipine  10 mg Oral Daily  . atorvastatin  40 mg Oral Daily  . Chlorhexidine Gluconate Cloth  6 each Topical Q0600  . citalopram  40 mg Oral Daily  . darbepoetin (ARANESP) injection - DIALYSIS  60 mcg Intravenous Q Tue-HD  . insulin aspart  0-9 Units Subcutaneous Q4H    Assessment/Plan **AMS secondary to uremia:  AVF immature so s/p temp HD catheter placement - plan HD 1 - 7/15, 2 - 7/16. No UF.  TDC placement today.  CLIP to [place] - TTS 7am spot.   Plan for HD today after [facility] placement so he can discharge tomorrow (allow time for TOV given urinary retention on presentation).   **  AGMA:  resolving with dialysis. Stopped bicarb.   **Anemia:  Hb 8.3 > 7.8 > 7.6 > 7.5.  Initiate ESA while admitted - 86mcg qTues.  Iron replete 7/14.  **DM:  H/o poor control but in setting of worsening renal function recent A1cs have been ok.  Hypoglycemic on presentation.  Per primary.    **HTN:  Normotensive now.  Home med amlodipine 10.  Continue.    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  Will need to track down health dept records --> asked my office staff to call.  He is more coherent today and says he went to health dept last week and had IM penicillin injection, had f/u today.  Will attempt to find dose and give while admitted.  **Urinary retention:  At admission unable to void and bladder scan 543mL - foley.  TOV prior to discharge > will remove foley now and check PVR.  Hopefully won't need to d/c with foley.    **Hypocalcemia:  Start calcitriol 0.25 daily, ca carbonate BID.  Watch outpatient.  PTH hasn't been checked here - will check tomorrow AM so can be followed up outpt.   **Dispo:  Will plan for HD today after [facility] placement, TOV today and hopefully will discharge tomorrow Morning.  He will need to sign paperwork at [facility] Friday afternoon to guarantee a start time Sat AM.  Jannifer Hick MD 02/02/2019, 9:50 AM  [place] Pager: [phone]

## 2019-02-02 NOTE — Progress Notes (Signed)
PROGRESS NOTE    Michael Riggs  CWU:[id number] DOB: 1971/05/19 DOA: 01/30/2019 PCP: [name], MD     Brief Narrative:  Michael Riggs is a 48 year old man with history of type 2 diabetes, CKD stage V, hypertension, chronic anemia, latent syphilis.  Patient presented altered mental status, found to have acute kidney injury, metabolic acidosis.  Patient underwent temporary hemodialysis catheter placement on 7/14 and started on dialysis.  New events last 24 hours / Subjective: No physical complaints today, joking around in the room, complains of being hungry  Assessment & Plan:   Principal Problem:   ARF (acute renal failure) ([place]) Active Problems:   Diabetes mellitus type 2 in obese ([place])   Hyperkalemia   Hypokalemia   Hypoglycemia   Syphilis   Macrocytic anemia  Acute kidney injury on CKD stage IV, now progressed to ESRD -Has recently placed left upper extremity fistula -Temporary dialysis catheter placed 7/14, started on dialysis  -Plan for tunneled dialysis catheter today 7/16 -Outpatient dialysis established at [facility] clinic Tuesday Thursday Saturday -Per nephrology  Acute metabolic encephalopathy -Likely due to uremia -Resolved  Metabolic acidosis -Resolved  Anasarca -Fluid management per hemodialysis  Type 2 diabetes -Hemoglobin A1c 5 -Had hypoglycemic episode this morning, stop sliding scale insulin and monitor off  History of syphilis -Treated as an outpatient with penicillin at the health department last week, appreciate nephrology assistance with tracking down information from health department   Essential hypertension  -Amlodipine and hydralazine as needed  Hyperlipidemia -Lipitor  Morbid obesity -Body mass index is 44.63 kg/m.  Leukocytosis -No source or evidence of infection at this time.  Continue to trend CBC  Acute urinary retention -Remove Foley today, trial void   DVT prophylaxis: Subcutaneous heparin Code  Status: Full Family Communication: None Disposition Plan: Tunneled dialysis catheter placement today, dialysis today, hopeful discharge home 7/17   Consultants:   Nephrology  Interventional radiology  Procedures:   Right IJ dialysis catheter placed 7/14  Antimicrobials:  Anti-infectives (From admission, onward)   Start     Dose/Rate Route Frequency Ordered Stop   02/02/19 0800  ceFAZolin (ANCEF) IVPB 2g/100 mL premix     2 g 200 mL/hr over 30 Minutes Intravenous To Radiology 02/01/19 1510 02/02/19 0825       Objective: Vitals:   02/01/19 1858 02/01/19 2226 02/02/19 0412 02/02/19 0533  BP: 136/73 123/67 (!) 146/75   Pulse: 83 83 83   Resp: 12  18   Temp: 98.7 F (37.1 C) 98.7 F (37.1 C) 98.3 F (36.8 C)   TempSrc: Oral Oral Oral   SpO2: 97% 96% 98%   Weight: (!) 140.4 kg   (!) 141.1 kg    Intake/Output Summary (Last 24 hours) at 02/02/2019 1015 Last data filed at 02/01/2019 1858 Gross per 24 hour  Intake 1210 ml  Output 700 ml  Net 510 ml   Filed Weights   02/01/19 1620 02/01/19 1858 02/02/19 0533  Weight: (!) 140.7 kg (!) 140.4 kg (!) 141.1 kg    Examination: General exam: Appears calm and comfortable  Respiratory system: Clear to auscultation. Respiratory effort normal. Cardiovascular system: S1 & S2 heard, RRR. No JVD, murmurs, rubs, gallops or clicks.  Gastrointestinal system: Abdomen is nondistended, soft and nontender. No organomegaly or masses felt. Normal bowel sounds heard. Central nervous system: Alert and oriented. No focal neurological deficits. Extremities: Symmetric 5 x 5 power. Skin: No rashes, lesions or ulcers Psychiatry: Judgement and insight appear normal. Mood & affect appropriate.  Data Reviewed: I have personally reviewed following labs and imaging studies  CBC: Recent Labs  Lab 01/30/19 1756  01/30/19 2339 01/31/19 0248 01/31/19 1120 02/01/19 1002 02/02/19 0322  WBC 14.6*  --   --  14.3* 14.8* 15.6* 14.9*  NEUTROABS  --    --   --  11.8*  --   --   --   HGB 8.3*   < > 8.2* 7.8* 7.9* 7.6* 7.5*  HCT 28.4*   < > 24.0* 24.7* 25.5* 23.4* 23.3*  MCV 103.6*  --   --  96.9 98.1 93.2 93.2  PLT 221  --   --  218 219 190 201   < > = values in this interval not displayed.   Basic Metabolic Panel: Recent Labs  Lab 01/30/19 1756 01/30/19 2058 01/30/19 2339 01/31/19 0248 02/01/19 0718 02/02/19 0322  NA 136 136 136 137 141 137  K 6.4* 6.3* 6.1* 5.9* 5.0 4.1  CL 109  --   --  105 107 102  CO2 9*  --   --  15* 19* 20*  GLUCOSE 73  --   --  209* 105* 116*  BUN 154*  --   --  152* 122* 84*  CREATININE 19.78*  --   --  19.14* 16.01* 11.77*  CALCIUM 6.9*  --   --  6.7* 6.2* 6.1*  PHOS  --   --   --  >30.0* 8.9* 6.1*   GFR: Estimated Creatinine Clearance: 11.1 mL/min (A) (by C-G formula based on SCr of 11.77 mg/dL (H)). Liver Function Tests: Recent Labs  Lab 01/30/19 2042 01/31/19 0248 02/01/19 0718 02/02/19 0322  AST 17 16  --   --   ALT 10 11  --   --   ALKPHOS 68 67  --   --   BILITOT 0.7 0.7  --   --   PROT 6.8 6.3*  --   --   ALBUMIN 2.0* 1.9* 1.6* 1.6*   No results for input(s): LIPASE, AMYLASE in the last 168 hours. Recent Labs  Lab 01/30/19 2042  AMMONIA 28   Coagulation Profile: Recent Labs  Lab 01/31/19 0248  INR 1.4*   Cardiac Enzymes: No results for input(s): CKTOTAL, CKMB, CKMBINDEX, TROPONINI in the last 168 hours. BNP (last 3 results) No results for input(s): PROBNP in the last 8760 hours. HbA1C: Recent Labs    01/31/19 1118  HGBA1C 5.0   CBG: Recent Labs  Lab 02/01/19 2057 02/02/19 0008 02/02/19 0409 02/02/19 0756 02/02/19 0846  GLUCAP 125* 124* 104* 44* 116*   Lipid Profile: No results for input(s): CHOL, HDL, LDLCALC, TRIG, CHOLHDL, LDLDIRECT in the last 72 hours. Thyroid Function Tests: No results for input(s): TSH, T4TOTAL, FREET4, T3FREE, THYROIDAB in the last 72 hours. Anemia Panel: Recent Labs    01/31/19 0248  FERRITIN 284  TIBC 119*  IRON 65    Sepsis Labs: Recent Labs  Lab 01/30/19 2044 01/31/19 0248  LATICACIDVEN 0.6 0.7    Recent Results (from the past 240 hour(s))  SARS Coronavirus 2 (CEPHEID - Performed in [place] hospital lab), Hosp Order     Status: None   Collection Time: 01/30/19  7:58 PM   Specimen: Nasopharyngeal Swab  Result Value Ref Range Status   SARS Coronavirus 2 NEGATIVE NEGATIVE Final    Comment: (NOTE) If result is NEGATIVE SARS-CoV-2 target nucleic acids are NOT DETECTED. The SARS-CoV-2 RNA is generally detectable in upper and lower  respiratory specimens during the acute  phase of infection. The lowest  concentration of SARS-CoV-2 viral copies this assay can detect is 250  copies / mL. A negative result does not preclude SARS-CoV-2 infection  and should not be used as the sole basis for treatment or other  patient management decisions.  A negative result may occur with  improper specimen collection / handling, submission of specimen other  than nasopharyngeal swab, presence of viral mutation(s) within the  areas targeted by this assay, and inadequate number of viral copies  (<250 copies / mL). A negative result must be combined with clinical  observations, patient history, and epidemiological information. If result is POSITIVE SARS-CoV-2 target nucleic acids are DETECTED. The SARS-CoV-2 RNA is generally detectable in upper and lower  respiratory specimens dur ing the acute phase of infection.  Positive  results are indicative of active infection with SARS-CoV-2.  Clinical  correlation with patient history and other diagnostic information is  necessary to determine patient infection status.  Positive results do  not rule out bacterial infection or co-infection with other viruses. If result is PRESUMPTIVE POSTIVE SARS-CoV-2 nucleic acids MAY BE PRESENT.   A presumptive positive result was obtained on the submitted specimen  and confirmed on repeat testing.  While 2019 novel coronavirus   (SARS-CoV-2) nucleic acids may be present in the submitted sample  additional confirmatory testing may be necessary for epidemiological  and / or clinical management purposes  to differentiate between  SARS-CoV-2 and other Sarbecovirus currently known to infect humans.  If clinically indicated additional testing with an alternate test  methodology [phone]) is advised. The SARS-CoV-2 RNA is generally  detectable in upper and lower respiratory sp ecimens during the acute  phase of infection. The expected result is Negative. Fact Sheet for Patients:  StrictlyIdeas.no Fact Sheet for Healthcare Providers: BankingDealers.co.za This test is not yet approved or cleared by the [place] FDA and has been authorized for detection and/or diagnosis of SARS-CoV-2 by FDA under an Emergency Use Authorization (EUA).  This EUA will remain in effect (meaning this test can be used) for the duration of the COVID-19 declaration under Section 564(b)(1) of the Act, 21 U.S.C. section 360bbb-3(b)(1), unless the authorization is terminated or revoked sooner. Performed at [facility], [place] [address], [place], [place] [address]   MRSA PCR Screening     Status: None   Collection Time: 01/31/19  3:24 AM   Specimen: Nasopharyngeal  Result Value Ref Range Status   MRSA by PCR NEGATIVE NEGATIVE Final    Comment:        The GeneXpert MRSA Assay (FDA approved for NASAL specimens only), is one component of a comprehensive MRSA colonization surveillance program. It is not intended to diagnose MRSA infection nor to guide or monitor treatment for MRSA infections. Performed at [facility], [place] [address] [place], [place] [address]       Radiology Studies: US Renal  Result Date: 01/31/2019 CLINICAL DATA:  Acute renal failure. History of diabetes, hypertension, chronic kidney disease. EXAM: RENAL / URINARY TRACT ULTRASOUND COMPLETE COMPARISON:   None. FINDINGS: Right Kidney: Renal measurements: 10.7 x 6.2 x 6.2 cm = volume: 214 mL. Suboptimal visualization. Echogenicity within normal limits. No mass or hydronephrosis visualized. Left Kidney: Renal measurements: 11.7 x 7.3 x 5.8 cm = volume: 256 mL. Suboptimal visualization. Echogenicity within normal limits. No mass or hydronephrosis visualized. Bladder: Appears normal for degree of bladder distention. IMPRESSION: Suboptimal visualization of both kidneys. No gross abnormality. No hydronephrosis. Electronically Signed  By: Franki Cabot M.D.   On: 01/31/2019 15:59      Scheduled Meds: . amLODipine  10 mg Oral Daily  . atorvastatin  40 mg Oral Daily  . calcitRIOL  0.25 mcg Oral Daily  . calcium carbonate  1 tablet Oral BID  . Chlorhexidine Gluconate Cloth  6 each Topical Q0600  . citalopram  40 mg Oral Daily  . darbepoetin (ARANESP) injection - DIALYSIS  60 mcg Intravenous Q Tue-HD  . insulin aspart  0-9 Units Subcutaneous Q4H   Continuous Infusions:    LOS: 3 days      Time spent: 25 minutes   Dessa Phi, DO [facility] [email or web address] 02/02/2019, 10:15 AM

## 2019-02-02 NOTE — Progress Notes (Signed)
Renal Navigator received call from Nephrologist/Dr. Johnney Ou with tentative plan for discharge tomorrow. Per Clinic Manager, patient can start in the OP HD clinic/[place] on Saturday, 02/04/19 if he is able to sign paperwork in the clinic tomorrow after discharge, 02/03/19. Therefore, he will need to be discharged fairly early in the day to go to the [place] clinic after discharge. Renal Navigator has communicated this plan to CM/A. [name] and has spoken with patient's sister-in-law/Dawn. Michael Riggs, [facility] Renal Navigator [phone]

## 2019-02-03 ENCOUNTER — Inpatient Hospital Stay (HOSPITAL_COMMUNITY): Payer: Medicare Other

## 2019-02-03 ENCOUNTER — Encounter (HOSPITAL_COMMUNITY): Payer: Self-pay | Admitting: Interventional Radiology

## 2019-02-03 DIAGNOSIS — N179 Acute kidney failure, unspecified: Secondary | ICD-10-CM | POA: Diagnosis not present

## 2019-02-03 DIAGNOSIS — D509 Iron deficiency anemia, unspecified: Secondary | ICD-10-CM | POA: Insufficient documentation

## 2019-02-03 DIAGNOSIS — R0600 Dyspnea, unspecified: Secondary | ICD-10-CM | POA: Insufficient documentation

## 2019-02-03 DIAGNOSIS — K219 Gastro-esophageal reflux disease without esophagitis: Secondary | ICD-10-CM | POA: Insufficient documentation

## 2019-02-03 DIAGNOSIS — E8770 Fluid overload, unspecified: Secondary | ICD-10-CM | POA: Diagnosis not present

## 2019-02-03 DIAGNOSIS — M79604 Pain in right leg: Secondary | ICD-10-CM | POA: Insufficient documentation

## 2019-02-03 DIAGNOSIS — I10 Essential (primary) hypertension: Secondary | ICD-10-CM | POA: Diagnosis not present

## 2019-02-03 DIAGNOSIS — J811 Chronic pulmonary edema: Secondary | ICD-10-CM | POA: Diagnosis not present

## 2019-02-03 DIAGNOSIS — Z87891 Personal history of nicotine dependence: Secondary | ICD-10-CM | POA: Diagnosis not present

## 2019-02-03 DIAGNOSIS — E291 Testicular hypofunction: Secondary | ICD-10-CM | POA: Insufficient documentation

## 2019-02-03 DIAGNOSIS — Z79899 Other long term (current) drug therapy: Secondary | ICD-10-CM | POA: Diagnosis not present

## 2019-02-03 DIAGNOSIS — N2581 Secondary hyperparathyroidism of renal origin: Secondary | ICD-10-CM | POA: Insufficient documentation

## 2019-02-03 DIAGNOSIS — R52 Pain, unspecified: Secondary | ICD-10-CM | POA: Insufficient documentation

## 2019-02-03 DIAGNOSIS — F3341 Major depressive disorder, recurrent, in partial remission: Secondary | ICD-10-CM | POA: Insufficient documentation

## 2019-02-03 DIAGNOSIS — R601 Generalized edema: Secondary | ICD-10-CM | POA: Diagnosis not present

## 2019-02-03 DIAGNOSIS — E119 Type 2 diabetes mellitus without complications: Secondary | ICD-10-CM | POA: Diagnosis not present

## 2019-02-03 DIAGNOSIS — Z7289 Other problems related to lifestyle: Secondary | ICD-10-CM | POA: Insufficient documentation

## 2019-02-03 DIAGNOSIS — E785 Hyperlipidemia, unspecified: Secondary | ICD-10-CM | POA: Insufficient documentation

## 2019-02-03 DIAGNOSIS — F5104 Psychophysiologic insomnia: Secondary | ICD-10-CM | POA: Insufficient documentation

## 2019-02-03 DIAGNOSIS — R531 Weakness: Secondary | ICD-10-CM | POA: Diagnosis not present

## 2019-02-03 DIAGNOSIS — Z7984 Long term (current) use of oral hypoglycemic drugs: Secondary | ICD-10-CM | POA: Diagnosis not present

## 2019-02-03 HISTORY — PX: IR FLUORO GUIDE CV LINE RIGHT: IMG2283

## 2019-02-03 HISTORY — PX: IR US GUIDE VASC ACCESS RIGHT: IMG2390

## 2019-02-03 LAB — CBC
HCT: 25.1 % — ABNORMAL LOW (ref 39.0–52.0)
Hemoglobin: 7.9 g/dL — ABNORMAL LOW (ref 13.0–17.0)
MCH: 29.9 pg (ref 26.0–34.0)
MCHC: 31.5 g/dL (ref 30.0–36.0)
MCV: 95.1 fL (ref 80.0–100.0)
Platelets: 196 10*3/uL (ref 150–400)
RBC: 2.64 MIL/uL — ABNORMAL LOW (ref 4.22–5.81)
RDW: 13.3 % (ref 11.5–15.5)
WBC: 12.6 10*3/uL — ABNORMAL HIGH (ref 4.0–10.5)
nRBC: 0 % (ref 0.0–0.2)

## 2019-02-03 LAB — RENAL FUNCTION PANEL
Albumin: 1.5 g/dL — ABNORMAL LOW (ref 3.5–5.0)
Anion gap: 10 (ref 5–15)
BUN: 50 mg/dL — ABNORMAL HIGH (ref 6–20)
CO2: 24 mmol/L (ref 22–32)
Calcium: 6.4 mg/dL — CL (ref 8.9–10.3)
Chloride: 100 mmol/L (ref 98–111)
Creatinine, Ser: 8.04 mg/dL — ABNORMAL HIGH (ref 0.61–1.24)
GFR calc Af Amer: 8 mL/min — ABNORMAL LOW (ref >60)
GFR calc non Af Amer: 7 mL/min — ABNORMAL LOW (ref >60)
Glucose, Bld: 126 mg/dL — ABNORMAL HIGH (ref 70–99)
Phosphorus: 4.2 mg/dL (ref 2.5–4.6)
Potassium: 4 mmol/L (ref 3.5–5.1)
Sodium: 134 mmol/L — ABNORMAL LOW (ref 135–145)

## 2019-02-03 IMAGING — XA IR RIGHT FLUORO GUIDE CV LINE
1 series · 1 of 1 positions shown · non-contrast
Comparison: None

CLINICAL DATA: Renal failure needs durable venous access for
hemodialysis. Temporary IJ catheter in place.

EXAM:
TUNNELED HEMODIALYSIS CATHETER PLACEMENT WITH ULTRASOUND AND
FLUOROSCOPIC GUIDANCE
TECHNIQUE: The procedure, risks, benefits, and alternatives were explained to
the patient. Questions regarding the procedure were encouraged and
answered. The patient understands and consents to the procedure. As
antibiotic prophylaxis, cefazolin 2 g was ordered pre-procedure and
administered intravenously within one hour of incision. Previously
placed right IJ catheter and surrounding skin prepped using maximum
barrier technique including cap and mask, sterile gown, sterile
gloves, large sterile sheet, and Chlorhexidine as cutaneous
antisepsis. The region was infiltrated locally with 1% lidocaine.

[Series 3: fl (-) angio · 1 of 1 slices shown]
[im 1/1]
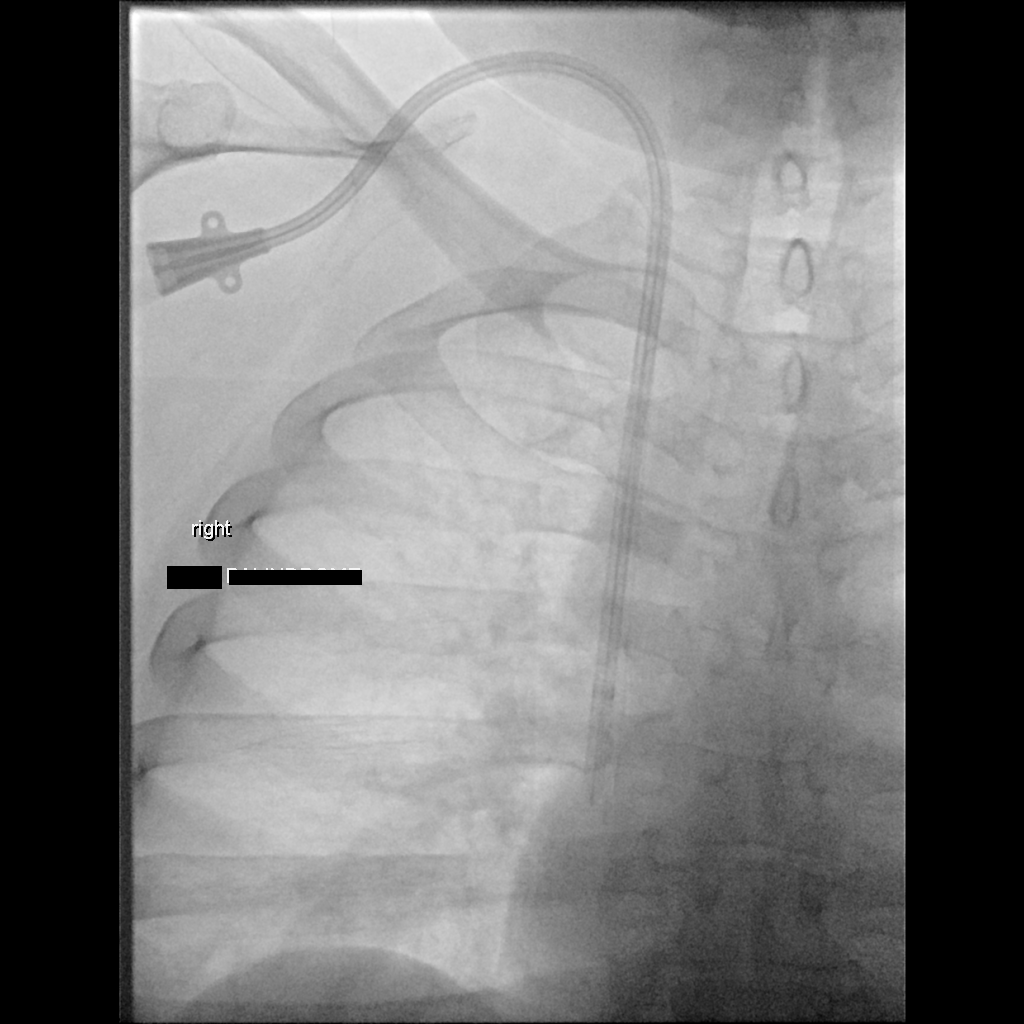

[1 of 1 positions shown; findings below may reference images not displayed]

Intravenous Fentanyl [JU] and Versed 2mg were administered as
conscious sedation during continuous monitoring of the patient's
level of consciousness and physiological / cardiorespiratory status
by the radiology RN, with a total moderate sedation time of 15
minutes.

A Bentson wire was advanced through the catheter into the IVC,
exchanged for an MPA catheter . A Palindrome 23 hemodialysis
catheter was tunneled from the right anterior chest wall approach to
the right IJ dermatotomy site. The MPA catheter was exchanged over
an Amplatz wire for serial vascular dilators which allow placement
of a peel-away sheath, through which the catheter was advanced under
intermittent fluoroscopy, positioned with its tips at the cavoatrial
junction. Spot chest radiograph confirms good catheter position. No
pneumothorax. Catheter was flushed and primed per protocol. Catheter
secured externally with O Prolene sutures.

COMPLICATIONS:
COMPLICATIONS
None immediate

FLUOROSCOPY TIME:  1 minutes; 257  [JU] DAP
IMPRESSION: 1. Technically successful placement of tunneled right IJ
hemodialysis catheter with ultrasound and fluoroscopic guidance.
Ready for routine use.

ACCESS:
Remains approachable for percutaneous intervention as needed.

## 2019-02-03 MED ORDER — CEFAZOLIN SODIUM-DEXTROSE 2-4 GM/100ML-% IV SOLN
INTRAVENOUS | Status: AC
  Filled 2019-02-03: qty 100

## 2019-02-03 MED ORDER — MIDAZOLAM HCL 2 MG/2ML IJ SOLN
INTRAMUSCULAR | Status: AC
  Filled 2019-02-03: qty 4

## 2019-02-03 MED ORDER — SODIUM CHLORIDE 0.9 % IV SOLN
INTRAVENOUS | Status: AC | PRN
  Administered 2019-02-03: 10 mL/h via INTRAVENOUS

## 2019-02-03 MED ORDER — CEFAZOLIN SODIUM-DEXTROSE 1-4 GM/50ML-% IV SOLN
INTRAVENOUS | Status: AC | PRN
  Administered 2019-02-03: 2 g via INTRAVENOUS

## 2019-02-03 MED ORDER — CHLORHEXIDINE GLUCONATE 4 % EX LIQD
CUTANEOUS | Status: AC
  Filled 2019-02-03: qty 15

## 2019-02-03 MED ORDER — FENTANYL CITRATE (PF) 100 MCG/2ML IJ SOLN
INTRAMUSCULAR | Status: AC
  Filled 2019-02-03: qty 4

## 2019-02-03 MED ORDER — HEPARIN SODIUM (PORCINE) 1000 UNIT/ML IJ SOLN
INTRAMUSCULAR | Status: AC
  Filled 2019-02-03: qty 1

## 2019-02-03 MED ORDER — FENTANYL CITRATE (PF) 100 MCG/2ML IJ SOLN
INTRAMUSCULAR | Status: AC | PRN
  Administered 2019-02-03: 50 ug via INTRAVENOUS
  Administered 2019-02-03: 25 ug via INTRAVENOUS

## 2019-02-03 MED ORDER — LIDOCAINE-EPINEPHRINE (PF) 1 %-1:200000 IJ SOLN
INTRAMUSCULAR | Status: AC
  Filled 2019-02-03: qty 30

## 2019-02-03 MED ORDER — LIDOCAINE-EPINEPHRINE 2 %-1:100000 IJ SOLN
INTRAMUSCULAR | Status: AC | PRN
  Administered 2019-02-03: 20 mL

## 2019-02-03 MED ORDER — MIDAZOLAM HCL 2 MG/2ML IJ SOLN
INTRAMUSCULAR | Status: AC | PRN
  Administered 2019-02-03 (×2): 1 mg via INTRAVENOUS

## 2019-02-03 NOTE — Progress Notes (Signed)
RT placed pt on CPAP dream station for the night on auto titrate max 16 min 8 with no oxygen bled into the system. Pt respiratory status stable at this time on 21% RA CPAP. RT will continue to monitor.

## 2019-02-03 NOTE — Discharge Summary (Signed)
Physician Discharge Summary  Michael Riggs ASN:[id number] DOB: 1971-06-21 DOA: 01/30/2019  PCP: [name], MD  Admit date: 01/30/2019 Discharge date: 02/03/2019  Admitted From: Home Disposition:  Home  Recommendations for Outpatient Follow-up:  1. Follow up with PCP in 1 week 2. Follow up with hemodialysis as scheduled Tuesday, Thursday, Saturday  Discharge Condition: Stable CODE STATUS: Full  Diet recommendation: Renal diet   Brief/Interim Summary: Michael Riggs is a 48 year old man with history of type 2 diabetes, CKD stage V, hypertension, chronic anemia, latent syphilis.  Patient presented altered mental status, found to have acute kidney injury, metabolic acidosis.  Patient underwent temporary hemodialysis catheter placement on 7/14 and started on dialysis.  Patient's mentation resolved back to his baseline.  He was able to undergo tunneled dialysis catheter placement on 7/17.  Outpatient hemodialysis established prior to discharge.  Discharge Diagnoses:  Principal Problem:   ARF (acute renal failure) (HCC) Active Problems:   Diabetes mellitus type 2 in obese (HCC)   Hyperkalemia   Hypokalemia   Hypoglycemia   Syphilis   Macrocytic anemia   Acute kidney injury on CKD stage IV, now progressed to ESRD -Has recently placed left upper extremity fistula -Temporary dialysis catheter placed 7/14, started on dialysis  -Tunneled dialysis catheter 7/16 -Outpatient dialysis established at [facility] clinic Tuesday Thursday Saturday -Per nephrology  Acute metabolic encephalopathy -Likely due to uremia -Resolved  Metabolic acidosis -Resolved  Anasarca -Fluid management per hemodialysis  Type 2 diabetes -Hemoglobin A1c 5 -Blood sugars well controlled without sliding scale insulin  History of syphilis -Treated as an outpatient with penicillin at the health department last week, appreciate nephrology assistance with tracking down information from  health department   Essential hypertension  -Amlodipine   Hyperlipidemia -Lipitor  Morbid obesity -Body mass index is 44.63 kg/m.  Leukocytosis -No source or evidence of infection at this time.  Leukocytosis improving without antibiotics  Acute urinary retention -Removed Foley    Discharge Instructions  Discharge Instructions    Call MD for:  difficulty breathing, headache or visual disturbances   Complete by: As directed    Call MD for:  extreme fatigue   Complete by: As directed    Call MD for:  hives   Complete by: As directed    Call MD for:  persistant dizziness or light-headedness   Complete by: As directed    Call MD for:  persistant nausea and vomiting   Complete by: As directed    Call MD for:  severe uncontrolled pain   Complete by: As directed    Call MD for:  temperature >100.4   Complete by: As directed    Increase activity slowly   Complete by: As directed      Allergies as of 02/03/2019   No Known Allergies     Medication List    STOP taking these medications   doxycycline 100 MG capsule Commonly known as: VIBRAMYCIN   glipiZIDE 10 MG tablet Commonly known as: GLUCOTROL   Levemir 100 UNIT/ML injection Generic drug: insulin detemir   liraglutide 18 MG/3ML Sopn Commonly known as: VICTOZA   losartan-hydrochlorothiazide 100-12.5 MG tablet Commonly known as: HYZAAR   NovoFine Plus 32G X 4 MM Misc Generic drug: Insulin Pen Needle   sodium bicarbonate 650 MG tablet     TAKE these medications   albuterol 108 (90 Base) MCG/ACT inhaler Commonly known as: VENTOLIN HFA Inhale into the lungs every 6 (six) hours as needed for wheezing or shortness of breath.  amLODipine 10 MG tablet Commonly known as: NORVASC Take 10 mg by mouth daily.   atorvastatin 40 MG tablet Commonly known as: LIPITOR Take 40 mg by mouth daily.   calcium carbonate 500 MG chewable tablet Commonly known as: TUMS - dosed in mg elemental calcium Chew 1 tablet  by mouth daily as needed for indigestion or heartburn.   citalopram 40 MG tablet Commonly known as: CELEXA Take 40 mg by mouth daily.   gabapentin 300 MG capsule Commonly known as: NEURONTIN Take 300 mg by mouth 3 (three) times daily.   guaiFENesin-dextromethorphan 100-10 MG/5ML syrup Commonly known as: ROBITUSSIN DM Take 10 mLs by mouth every 4 (four) hours as needed for cough.   hydrOXYzine 25 MG tablet Commonly known as: ATARAX/VISTARIL Take 25 mg by mouth every 8 (eight) hours as needed for anxiety.   Melatonin 5 MG Caps Take 1 capsule by mouth at bedtime.   omeprazole 40 MG capsule Commonly known as: PRILOSEC Take 40 mg by mouth daily.   ondansetron 4 MG tablet Commonly known as: ZOFRAN Take 4 mg by mouth daily as needed for nausea or vomiting.   oxyCODONE-acetaminophen 5-325 MG tablet Commonly known as: Percocet Take 1 tablet by mouth every 6 (six) hours as needed for severe pain.   traMADol 50 MG tablet Commonly known as: ULTRAM Take by mouth every 6 (six) hours as needed.      Follow-up Information    [facility] on 02/27/2019.   Why: 9:30 am with Dr. Karle Plumber Contact information: [place] [address] [phone]         No Known Allergies  Consultations:  Nephrology  IR   Procedures/Studies: US Renal  Result Date: 01/31/2019 CLINICAL DATA:  Acute renal failure. History of diabetes, hypertension, chronic kidney disease. EXAM: RENAL / URINARY TRACT ULTRASOUND COMPLETE COMPARISON:  None. FINDINGS: Right Kidney: Renal measurements: 10.7 x 6.2 x 6.2 cm = volume: 214 mL. Suboptimal visualization. Echogenicity within normal limits. No mass or hydronephrosis visualized. Left Kidney: Renal measurements: 11.7 x 7.3 x 5.8 cm = volume: 256 mL. Suboptimal visualization. Echogenicity within normal limits. No mass or hydronephrosis visualized. Bladder: Appears normal for degree of  bladder distention. IMPRESSION: Suboptimal visualization of both kidneys. No gross abnormality. No hydronephrosis. Electronically Signed   By: Franki Cabot M.D.   On: 01/31/2019 15:59   Ir Fluoro Guide Cv Line Right  Result Date: 01/31/2019 CLINICAL DATA:  Renal failure, needs hemodialysis access. Elevated white blood cell count of indeterminate significance. EXAM: EXAM RIGHT IJ CATHETER PLACEMENT UNDER ULTRASOUND AND FLUOROSCOPIC GUIDANCE TECHNIQUE: The procedure, risks (including but not limited to bleeding, infection, organ damage, pneumothorax), benefits, and alternatives were explained to the family. Questions regarding the procedure were encouraged and answered. The family understands and consents to the procedure. Patency of the right IJ vein was confirmed with ultrasound with image documentation. An appropriate skin site was determined. Skin site was marked. Region was prepped using maximum barrier technique including cap and mask, sterile gown, sterile gloves, large sterile sheet, and Chlorhexidine as cutaneous antisepsis. The region was infiltrated locally with 1% lidocaine. Under real-time ultrasound guidance, the right IJ vein was accessed with a 21 gauge needle; the needle tip within the vein was confirmed with ultrasound image documentation. The needle exchanged over a 018 guidewire for vascular dilator which allowed advancement of a 16 cm Mahurkar catheter. This was positioned with the tip at the cavoatrial junction. Spot chest radiograph  shows good positioning and no pneumothorax. Catheter was flushed and sutured externally with 0-Prolene sutures. Patient tolerated the procedure well. FLUOROSCOPY TIME:  Less than 6 seconds; 10 uGym2 DAP COMPLICATIONS: COMPLICATIONS none IMPRESSION: 1. Technically successful right IJ Mahurkar catheter placement. Electronically Signed   By: Lucrezia Europe M.D.   On: 01/31/2019 12:33   Ir US Guide Vasc Access Right  Result Date: 01/31/2019 CLINICAL DATA:  Renal  failure, needs hemodialysis access. Elevated white blood cell count of indeterminate significance.  EXAM: EXAM RIGHT IJ CATHETER PLACEMENT UNDER ULTRASOUND AND FLUOROSCOPIC GUIDANCE  TECHNIQUE: The procedure, risks (including but not limited to bleeding, infection, organ damage, pneumothorax), benefits, and alternatives were explained to the family. Questions regarding the procedure were encouraged and answered. The family understands and consents to the procedure. Patency of the right IJ vein was confirmed with ultrasound with image documentation. An appropriate skin site was determined. Skin site was marked. Region was prepped using maximum barrier technique including cap and mask, sterile gown, sterile gloves, large sterile sheet, and Chlorhexidine as cutaneous antisepsis. The region was infiltrated locally with 1% lidocaine. Under real-time ultrasound guidance, the right IJ vein was accessed with a 21 gauge needle; the needle tip within the vein was confirmed with ultrasound image documentation. The needle exchanged over a 018 guidewire for vascular dilator which allowed advancement of a 16 cm Mahurkar catheter. This was positioned with the tip at the cavoatrial junction. Spot chest radiograph shows good positioning and no pneumothorax. Catheter was flushed and sutured externally with 0-Prolene sutures. Patient tolerated the procedure well.  FLUOROSCOPY TIME:  Less than 6 seconds; 10 uGym2 DAP  COMPLICATIONS: COMPLICATIONS none  IMPRESSION: 1. Technically successful right IJ Mahurkar catheter placement.   Electronically Signed   By: Lucrezia Europe M.D.   On: 01/31/2019 12:33  Dg Chest Port 1 View  Result Date: 01/30/2019 CLINICAL DATA:  Weakness EXAM: PORTABLE CHEST 1 VIEW COMPARISON:  December 30, 2018 FINDINGS: Cardiomegaly with vascular congestion. Mild perihilar and bibasilar opacities concerning for early edema. No effusions. No acute bony abnormality. IMPRESSION: Cardiomegaly with vascular congestion  and possible early perihilar edema. Electronically Signed   By: Rolm Baptise M.D.   On: 01/30/2019 18:50   US Scrotum W/doppler  Result Date: 01/31/2019 CLINICAL DATA:  48 year old male with pain and swelling. EXAM: SCROTAL ULTRASOUND DOPPLER ULTRASOUND OF THE TESTICLES TECHNIQUE: Complete ultrasound examination of the testicles, epididymis, and other scrotal structures was performed. Color and spectral Doppler ultrasound were also utilized to evaluate blood flow to the testicles. COMPARISON:  CT Abdomen and Pelvis 07/31/2017. FINDINGS: Right testicle Measurements: 3.0 x 2.1 x 1.9 centimeters. No mass or microlithiasis visualized. Left testicle Measurements: 2.8 x 1.9 x 2.2 centimeters. No mass or microlithiasis visualized. Right epididymis:  Normal in size and appearance. Left epididymis:  Normal in size and appearance. Hydrocele:  None visualized. Varicocele:  None visualized. Pulsed Doppler interrogation of both testes demonstrates normal low resistance arterial and venous waveforms bilaterally. Other findings: Generalized scrotal soft tissue thickening or edema. No ultrasound evidence of soft tissue gas. IMPRESSION: No evidence of testicular torsion, and negative aside from generalized scrotal soft tissue thickening/edema. Electronically Signed   By: Genevie Ann M.D.   On: 01/31/2019 00:34       Discharge Exam: Vitals:   02/03/19 0946 02/03/19 1023  BP: (!) 143/82 136/76  Pulse: 88 84  Resp: 11 18  Temp:  97.7 F (36.5 C)  SpO2: 100% 97%     General: Pt is  alert, awake, not in acute distress Cardiovascular: RRR, S1/S2 +, no rubs, no gallops Respiratory: CTA bilaterally, no wheezing, no rhonchi Abdominal: Soft, NT, ND, bowel sounds + Extremities: no edema, no cyanosis    The results of significant diagnostics from this hospitalization (including imaging, microbiology, ancillary and laboratory) are listed below for reference.     Microbiology: Recent Results (from the past 240  hour(s))  SARS Coronavirus 2 (CEPHEID - Performed in [facility] hospital lab), Hosp Order     Status: None   Collection Time: 01/30/19  7:58 PM   Specimen: Nasopharyngeal Swab  Result Value Ref Range Status   SARS Coronavirus 2 NEGATIVE NEGATIVE Final    Comment: (NOTE) If result is NEGATIVE SARS-CoV-2 target nucleic acids are NOT DETECTED. The SARS-CoV-2 RNA is generally detectable in upper and lower  respiratory specimens during the acute phase of infection. The lowest  concentration of SARS-CoV-2 viral copies this assay can detect is 250  copies / mL. A negative result does not preclude SARS-CoV-2 infection  and should not be used as the sole basis for treatment or other  patient management decisions.  A negative result may occur with  improper specimen collection / handling, submission of specimen other  than nasopharyngeal swab, presence of viral mutation(s) within the  areas targeted by this assay, and inadequate number of viral copies  (<250 copies / mL). A negative result must be combined with clinical  observations, patient history, and epidemiological information. If result is POSITIVE SARS-CoV-2 target nucleic acids are DETECTED. The SARS-CoV-2 RNA is generally detectable in upper and lower  respiratory specimens dur ing the acute phase of infection.  Positive  results are indicative of active infection with SARS-CoV-2.  Clinical  correlation with patient history and other diagnostic information is  necessary to determine patient infection status.  Positive results do  not rule out bacterial infection or co-infection with other viruses. If result is PRESUMPTIVE POSTIVE SARS-CoV-2 nucleic acids MAY BE PRESENT.   A presumptive positive result was obtained on the submitted specimen  and confirmed on repeat testing.  While 2019 novel coronavirus  (SARS-CoV-2) nucleic acids may be present in the submitted sample  additional confirmatory testing may be necessary for  epidemiological  and / or clinical management purposes  to differentiate between  SARS-CoV-2 and other Sarbecovirus currently known to infect humans.  If clinically indicated additional testing with an alternate test  methodology [phone]) is advised. The SARS-CoV-2 RNA is generally  detectable in upper and lower respiratory sp ecimens during the acute  phase of infection. The expected result is Negative. Fact Sheet for Patients:  StrictlyIdeas.no Fact Sheet for Healthcare Providers: BankingDealers.co.za This test is not yet approved or cleared by the [place] FDA and has been authorized for detection and/or diagnosis of SARS-CoV-2 by FDA under an Emergency Use Authorization (EUA).  This EUA will remain in effect (meaning this test can be used) for the duration of the COVID-19 declaration under Section 564(b)(1) of the Act, 21 U.S.C. section 360bbb-3(b)(1), unless the authorization is terminated or revoked sooner. Performed at [facility], [employer] [address], [place], [place] [address]   MRSA PCR Screening     Status: None   Collection Time: 01/31/19  3:24 AM   Specimen: Nasopharyngeal  Result Value Ref Range Status   MRSA by PCR NEGATIVE NEGATIVE Final    Comment:        The GeneXpert MRSA Assay (FDA approved for NASAL specimens only), is one component  of a comprehensive MRSA colonization surveillance program. It is not intended to diagnose MRSA infection nor to guide or monitor treatment for MRSA infections. Performed at [facility], [place] [address], [place], [place] [address]      Labs: BNP (last 3 results) No results for input(s): BNP in the last 8760 hours. Basic Metabolic Panel: Recent Labs  Lab 01/30/19 1756  01/30/19 2339 01/31/19 0248 02/01/19 0718 02/02/19 0322 02/03/19 0342  NA 136   < > 136 137 141 137 134*  K 6.4*   < > 6.1* 5.9* 5.0 4.1 4.0  CL 109  --   --  105 107 102 100  CO2 9*  --    --  15* 19* 20* 24  GLUCOSE 73  --   --  209* 105* 116* 126*  BUN 154*  --   --  152* 122* 84* 50*  CREATININE 19.78*  --   --  19.14* 16.01* 11.77* 8.04*  CALCIUM 6.9*  --   --  6.7* 6.2* 6.1* 6.4*  PHOS  --   --   --  >30.0* 8.9* 6.1* 4.2   < > = values in this interval not displayed.   Liver Function Tests: Recent Labs  Lab 01/30/19 2042 01/31/19 0248 02/01/19 0718 02/02/19 0322 02/03/19 0342  AST 17 16  --   --   --   ALT 10 11  --   --   --   ALKPHOS 68 67  --   --   --   BILITOT 0.7 0.7  --   --   --   PROT 6.8 6.3*  --   --   --   ALBUMIN 2.0* 1.9* 1.6* 1.6* 1.5*   No results for input(s): LIPASE, AMYLASE in the last 168 hours. Recent Labs  Lab 01/30/19 2042  AMMONIA 28   CBC: Recent Labs  Lab 01/31/19 0248 01/31/19 1120 02/01/19 1002 02/02/19 0322 02/03/19 0342  WBC 14.3* 14.8* 15.6* 14.9* 12.6*  NEUTROABS 11.8*  --   --   --   --   HGB 7.8* 7.9* 7.6* 7.5* 7.9*  HCT 24.7* 25.5* 23.4* 23.3* 25.1*  MCV 96.9 98.1 93.2 93.2 95.1  PLT 218 219 190 201 196   Cardiac Enzymes: No results for input(s): CKTOTAL, CKMB, CKMBINDEX, TROPONINI in the last 168 hours. BNP: Invalid input(s): POCBNP CBG: Recent Labs  Lab 02/02/19 0409 02/02/19 0756 02/02/19 0846 02/02/19 1146 02/02/19 1638  GLUCAP 104* 44* 116* 97 90   D-Dimer No results for input(s): DDIMER in the last 72 hours. Hgb A1c Recent Labs    01/31/19 1118  HGBA1C 5.0   Lipid Profile No results for input(s): CHOL, HDL, LDLCALC, TRIG, CHOLHDL, LDLDIRECT in the last 72 hours. Thyroid function studies No results for input(s): TSH, T4TOTAL, T3FREE, THYROIDAB in the last 72 hours.  Invalid input(s): FREET3 Anemia work up No results for input(s): VITAMINB12, FOLATE, FERRITIN, TIBC, IRON, RETICCTPCT in the last 72 hours. Urinalysis    Component Value Date/Time   COLORURINE YELLOW 01/31/2019 2315   APPEARANCEUR CLOUDY (A) 01/31/2019 2315   LABSPEC 1.012 01/31/2019 2315   PHURINE 5.0 01/31/2019  2315   GLUCOSEU 50 (A) 01/31/2019 2315   HGBUR SMALL (A) 01/31/2019 2315   BILIRUBINUR NEGATIVE 01/31/2019 2315   KETONESUR NEGATIVE 01/31/2019 2315   PROTEINUR >=300 (A) 01/31/2019 2315   NITRITE NEGATIVE 01/31/2019 2315   LEUKOCYTESUR NEGATIVE 01/31/2019 2315   Sepsis Labs Invalid input(s): PROCALCITONIN,  WBC,  LACTICIDVEN  Microbiology Recent Results (from the past 240 hour(s))  SARS Coronavirus 2 (CEPHEID - Performed in [facility] hospital lab), Hosp Order     Status: None   Collection Time: 01/30/19  7:58 PM   Specimen: Nasopharyngeal Swab  Result Value Ref Range Status   SARS Coronavirus 2 NEGATIVE NEGATIVE Final    Comment: (NOTE) If result is NEGATIVE SARS-CoV-2 target nucleic acids are NOT DETECTED. The SARS-CoV-2 RNA is generally detectable in upper and lower  respiratory specimens during the acute phase of infection. The lowest  concentration of SARS-CoV-2 viral copies this assay can detect is 250  copies / mL. A negative result does not preclude SARS-CoV-2 infection  and should not be used as the sole basis for treatment or other  patient management decisions.  A negative result may occur with  improper specimen collection / handling, submission of specimen other  than nasopharyngeal swab, presence of viral mutation(s) within the  areas targeted by this assay, and inadequate number of viral copies  (<250 copies / mL). A negative result must be combined with clinical  observations, patient history, and epidemiological information. If result is POSITIVE SARS-CoV-2 target nucleic acids are DETECTED. The SARS-CoV-2 RNA is generally detectable in upper and lower  respiratory specimens dur ing the acute phase of infection.  Positive  results are indicative of active infection with SARS-CoV-2.  Clinical  correlation with patient history and other diagnostic information is  necessary to determine patient infection status.  Positive results do  not rule out bacterial  infection or co-infection with other viruses. If result is PRESUMPTIVE POSTIVE SARS-CoV-2 nucleic acids MAY BE PRESENT.   A presumptive positive result was obtained on the submitted specimen  and confirmed on repeat testing.  While 2019 novel coronavirus  (SARS-CoV-2) nucleic acids may be present in the submitted sample  additional confirmatory testing may be necessary for epidemiological  and / or clinical management purposes  to differentiate between  SARS-CoV-2 and other Sarbecovirus currently known to infect humans.  If clinically indicated additional testing with an alternate test  methodology [phone]) is advised. The SARS-CoV-2 RNA is generally  detectable in upper and lower respiratory sp ecimens during the acute  phase of infection. The expected result is Negative. Fact Sheet for Patients:  StrictlyIdeas.no Fact Sheet for Healthcare Providers: BankingDealers.co.za This test is not yet approved or cleared by the [place] FDA and has been authorized for detection and/or diagnosis of SARS-CoV-2 by FDA under an Emergency Use Authorization (EUA).  This EUA will remain in effect (meaning this test can be used) for the duration of the COVID-19 declaration under Section 564(b)(1) of the Act, 21 U.S.C. section 360bbb-3(b)(1), unless the authorization is terminated or revoked sooner. Performed at [facility], [place] [address], [place], [place] [address]   MRSA PCR Screening     Status: None   Collection Time: 01/31/19  3:24 AM   Specimen: Nasopharyngeal  Result Value Ref Range Status   MRSA by PCR NEGATIVE NEGATIVE Final    Comment:        The GeneXpert MRSA Assay (FDA approved for NASAL specimens only), is one component of a comprehensive MRSA colonization surveillance program. It is not intended to diagnose MRSA infection nor to guide or monitor treatment for MRSA infections. Performed at [facility], [place] [address], [place], [place] [address]       Patient was seen and examined on the day of discharge and was found to be in  stable condition. Time coordinating discharge: 35 minutes including assessment and coordination of care, as well as examination of the patient.   SIGNED:  Dessa Phi, DO [facility] [email or web address] 02/03/2019, 10:33 AM

## 2019-02-03 NOTE — Procedures (Signed)
  Procedure: R IJ cath revision to tunneled HD 23cm EBL:   minimal Complications:  none immediate  See full dictation in [facility].  Dillard Cannon MD Main # [phone] Pager  [phone]

## 2019-02-03 NOTE — Plan of Care (Signed)

## 2019-02-03 NOTE — Progress Notes (Signed)
Pt expected to discharge with his brother. Pt currently a/o, VSS, showing no signs of distress. All belongings sent with patient. MD aware of discharge. RN to continue to monitor.

## 2019-02-03 NOTE — Progress Notes (Signed)
Bladder scan completed, zero noted. MD made ware.

## 2019-02-04 ENCOUNTER — Observation Stay (HOSPITAL_COMMUNITY): Payer: Medicare Other

## 2019-02-04 ENCOUNTER — Encounter (HOSPITAL_COMMUNITY): Payer: Self-pay | Admitting: *Deleted

## 2019-02-04 ENCOUNTER — Other Ambulatory Visit: Payer: Self-pay

## 2019-02-04 ENCOUNTER — Inpatient Hospital Stay (HOSPITAL_COMMUNITY)
Admission: AD | Admit: 2019-02-04 | Discharge: 2019-02-08 | DRG: 640 | Disposition: A | Discharge status: Rehabilitation Facility | Payer: Medicare Other | Source: Other Acute Inpatient Hospital | Attending: Internal Medicine | Admitting: Internal Medicine

## 2019-02-04 DIAGNOSIS — D631 Anemia in chronic kidney disease: Secondary | ICD-10-CM | POA: Diagnosis not present

## 2019-02-04 DIAGNOSIS — Z6841 Body Mass Index (BMI) 40.0 and over, adult: Secondary | ICD-10-CM

## 2019-02-04 DIAGNOSIS — I10 Essential (primary) hypertension: Secondary | ICD-10-CM | POA: Diagnosis not present

## 2019-02-04 DIAGNOSIS — N186 End stage renal disease: Secondary | ICD-10-CM | POA: Diagnosis not present

## 2019-02-04 DIAGNOSIS — R0602 Shortness of breath: Secondary | ICD-10-CM | POA: Diagnosis not present

## 2019-02-04 DIAGNOSIS — Z87891 Personal history of nicotine dependence: Secondary | ICD-10-CM

## 2019-02-04 DIAGNOSIS — D72829 Elevated white blood cell count, unspecified: Secondary | ICD-10-CM | POA: Diagnosis not present

## 2019-02-04 DIAGNOSIS — K219 Gastro-esophageal reflux disease without esophagitis: Secondary | ICD-10-CM | POA: Diagnosis present

## 2019-02-04 DIAGNOSIS — A539 Syphilis, unspecified: Secondary | ICD-10-CM | POA: Diagnosis present

## 2019-02-04 DIAGNOSIS — E871 Hypo-osmolality and hyponatremia: Secondary | ICD-10-CM | POA: Diagnosis present

## 2019-02-04 DIAGNOSIS — Z992 Dependence on renal dialysis: Secondary | ICD-10-CM | POA: Diagnosis not present

## 2019-02-04 DIAGNOSIS — Z89512 Acquired absence of left leg below knee: Secondary | ICD-10-CM | POA: Diagnosis not present

## 2019-02-04 DIAGNOSIS — E785 Hyperlipidemia, unspecified: Secondary | ICD-10-CM | POA: Diagnosis not present

## 2019-02-04 DIAGNOSIS — Z9181 History of falling: Secondary | ICD-10-CM

## 2019-02-04 DIAGNOSIS — Z7984 Long term (current) use of oral hypoglycemic drugs: Secondary | ICD-10-CM | POA: Diagnosis not present

## 2019-02-04 DIAGNOSIS — Z1159 Encounter for screening for other viral diseases: Secondary | ICD-10-CM | POA: Diagnosis not present

## 2019-02-04 DIAGNOSIS — I120 Hypertensive chronic kidney disease with stage 5 chronic kidney disease or end stage renal disease: Secondary | ICD-10-CM | POA: Diagnosis not present

## 2019-02-04 DIAGNOSIS — E119 Type 2 diabetes mellitus without complications: Secondary | ICD-10-CM | POA: Diagnosis not present

## 2019-02-04 DIAGNOSIS — E1169 Type 2 diabetes mellitus with other specified complication: Secondary | ICD-10-CM | POA: Diagnosis present

## 2019-02-04 DIAGNOSIS — E669 Obesity, unspecified: Secondary | ICD-10-CM | POA: Diagnosis present

## 2019-02-04 DIAGNOSIS — D539 Nutritional anemia, unspecified: Secondary | ICD-10-CM | POA: Diagnosis not present

## 2019-02-04 DIAGNOSIS — Z79899 Other long term (current) drug therapy: Secondary | ICD-10-CM | POA: Diagnosis not present

## 2019-02-04 DIAGNOSIS — E1122 Type 2 diabetes mellitus with diabetic chronic kidney disease: Secondary | ICD-10-CM | POA: Diagnosis not present

## 2019-02-04 DIAGNOSIS — E8770 Fluid overload, unspecified: Principal | ICD-10-CM | POA: Diagnosis present

## 2019-02-04 DIAGNOSIS — N179 Acute kidney failure, unspecified: Secondary | ICD-10-CM | POA: Diagnosis not present

## 2019-02-04 DIAGNOSIS — L97919 Non-pressure chronic ulcer of unspecified part of right lower leg with unspecified severity: Secondary | ICD-10-CM | POA: Diagnosis present

## 2019-02-04 DIAGNOSIS — N5089 Other specified disorders of the male genital organs: Secondary | ICD-10-CM | POA: Diagnosis not present

## 2019-02-04 DIAGNOSIS — R531 Weakness: Secondary | ICD-10-CM | POA: Diagnosis not present

## 2019-02-04 HISTORY — DX: Fluid overload, unspecified: E87.70

## 2019-02-04 LAB — COMPREHENSIVE METABOLIC PANEL
ALT: 7 U/L (ref 0–44)
AST: 26 U/L (ref 15–41)
Albumin: 1.6 g/dL — ABNORMAL LOW (ref 3.5–5.0)
Alkaline Phosphatase: 74 U/L (ref 38–126)
Anion gap: 12 (ref 5–15)
BUN: 57 mg/dL — ABNORMAL HIGH (ref 6–20)
CO2: 21 mmol/L — ABNORMAL LOW (ref 22–32)
Calcium: 6.3 mg/dL — CL (ref 8.9–10.3)
Chloride: 100 mmol/L (ref 98–111)
Creatinine, Ser: 8.82 mg/dL — ABNORMAL HIGH (ref 0.61–1.24)
GFR calc Af Amer: 7 mL/min — ABNORMAL LOW (ref >60)
GFR calc non Af Amer: 6 mL/min — ABNORMAL LOW (ref >60)
Glucose, Bld: 98 mg/dL (ref 70–99)
Potassium: 3.8 mmol/L (ref 3.5–5.1)
Sodium: 133 mmol/L — ABNORMAL LOW (ref 135–145)
Total Bilirubin: 0.3 mg/dL (ref 0.3–1.2)
Total Protein: 5.6 g/dL — ABNORMAL LOW (ref 6.5–8.1)

## 2019-02-04 LAB — CBC WITH DIFFERENTIAL/PLATELET
Abs Immature Granulocytes: 0.48 10*3/uL — ABNORMAL HIGH (ref 0.00–0.07)
Basophils Absolute: 0.1 10*3/uL (ref 0.0–0.1)
Basophils Relative: 0 %
Eosinophils Absolute: 0.6 10*3/uL — ABNORMAL HIGH (ref 0.0–0.5)
Eosinophils Relative: 4 %
HCT: 25.2 % — ABNORMAL LOW (ref 39.0–52.0)
Hemoglobin: 7.8 g/dL — ABNORMAL LOW (ref 13.0–17.0)
Immature Granulocytes: 4 %
Lymphocytes Relative: 8 %
Lymphs Abs: 1.1 10*3/uL (ref 0.7–4.0)
MCH: 29.9 pg (ref 26.0–34.0)
MCHC: 31 g/dL (ref 30.0–36.0)
MCV: 96.6 fL (ref 80.0–100.0)
Monocytes Absolute: 1.4 10*3/uL — ABNORMAL HIGH (ref 0.1–1.0)
Monocytes Relative: 11 %
Neutro Abs: 9.8 10*3/uL — ABNORMAL HIGH (ref 1.7–7.7)
Neutrophils Relative %: 73 %
Platelets: 200 10*3/uL (ref 150–400)
RBC: 2.61 MIL/uL — ABNORMAL LOW (ref 4.22–5.81)
RDW: 13.1 % (ref 11.5–15.5)
WBC: 13.5 10*3/uL — ABNORMAL HIGH (ref 4.0–10.5)
nRBC: 0 % (ref 0.0–0.2)

## 2019-02-04 LAB — CBC
HCT: 26 % — ABNORMAL LOW (ref 39.0–52.0)
Hemoglobin: 8 g/dL — ABNORMAL LOW (ref 13.0–17.0)
MCH: 29.9 pg (ref 26.0–34.0)
MCHC: 30.8 g/dL (ref 30.0–36.0)
MCV: 97 fL (ref 80.0–100.0)
Platelets: 202 10*3/uL (ref 150–400)
RBC: 2.68 MIL/uL — ABNORMAL LOW (ref 4.22–5.81)
RDW: 13.2 % (ref 11.5–15.5)
WBC: 13.3 10*3/uL — ABNORMAL HIGH (ref 4.0–10.5)
nRBC: 0 % (ref 0.0–0.2)

## 2019-02-04 LAB — GLUCOSE, CAPILLARY
Glucose-Capillary: 83 mg/dL (ref 70–99)
Glucose-Capillary: 105 mg/dL — ABNORMAL HIGH (ref 70–99)
Glucose-Capillary: 120 mg/dL — ABNORMAL HIGH (ref 70–99)
Glucose-Capillary: 118 mg/dL — ABNORMAL HIGH (ref 70–99)
Glucose-Capillary: 152 mg/dL — ABNORMAL HIGH (ref 70–99)

## 2019-02-04 LAB — CREATININE, SERUM
Creatinine, Ser: 8.81 mg/dL — ABNORMAL HIGH (ref 0.61–1.24)
GFR calc Af Amer: 8 mL/min — ABNORMAL LOW (ref >60)
GFR calc non Af Amer: 6 mL/min — ABNORMAL LOW (ref >60)

## 2019-02-04 LAB — PARATHYROID HORMONE, INTACT (NO CA): PTH: 144 pg/mL — ABNORMAL HIGH (ref 15–65)

## 2019-02-04 IMAGING — DX PORTABLE CHEST - 1 VIEW
1 series · 1 of 1 positions shown · non-contrast
Comparison: Prior radiograph from [DATE].

CLINICAL DATA: Initial evaluation for acute shortness of breath.

EXAM:
PORTABLE CHEST 1 VIEW

[chest]
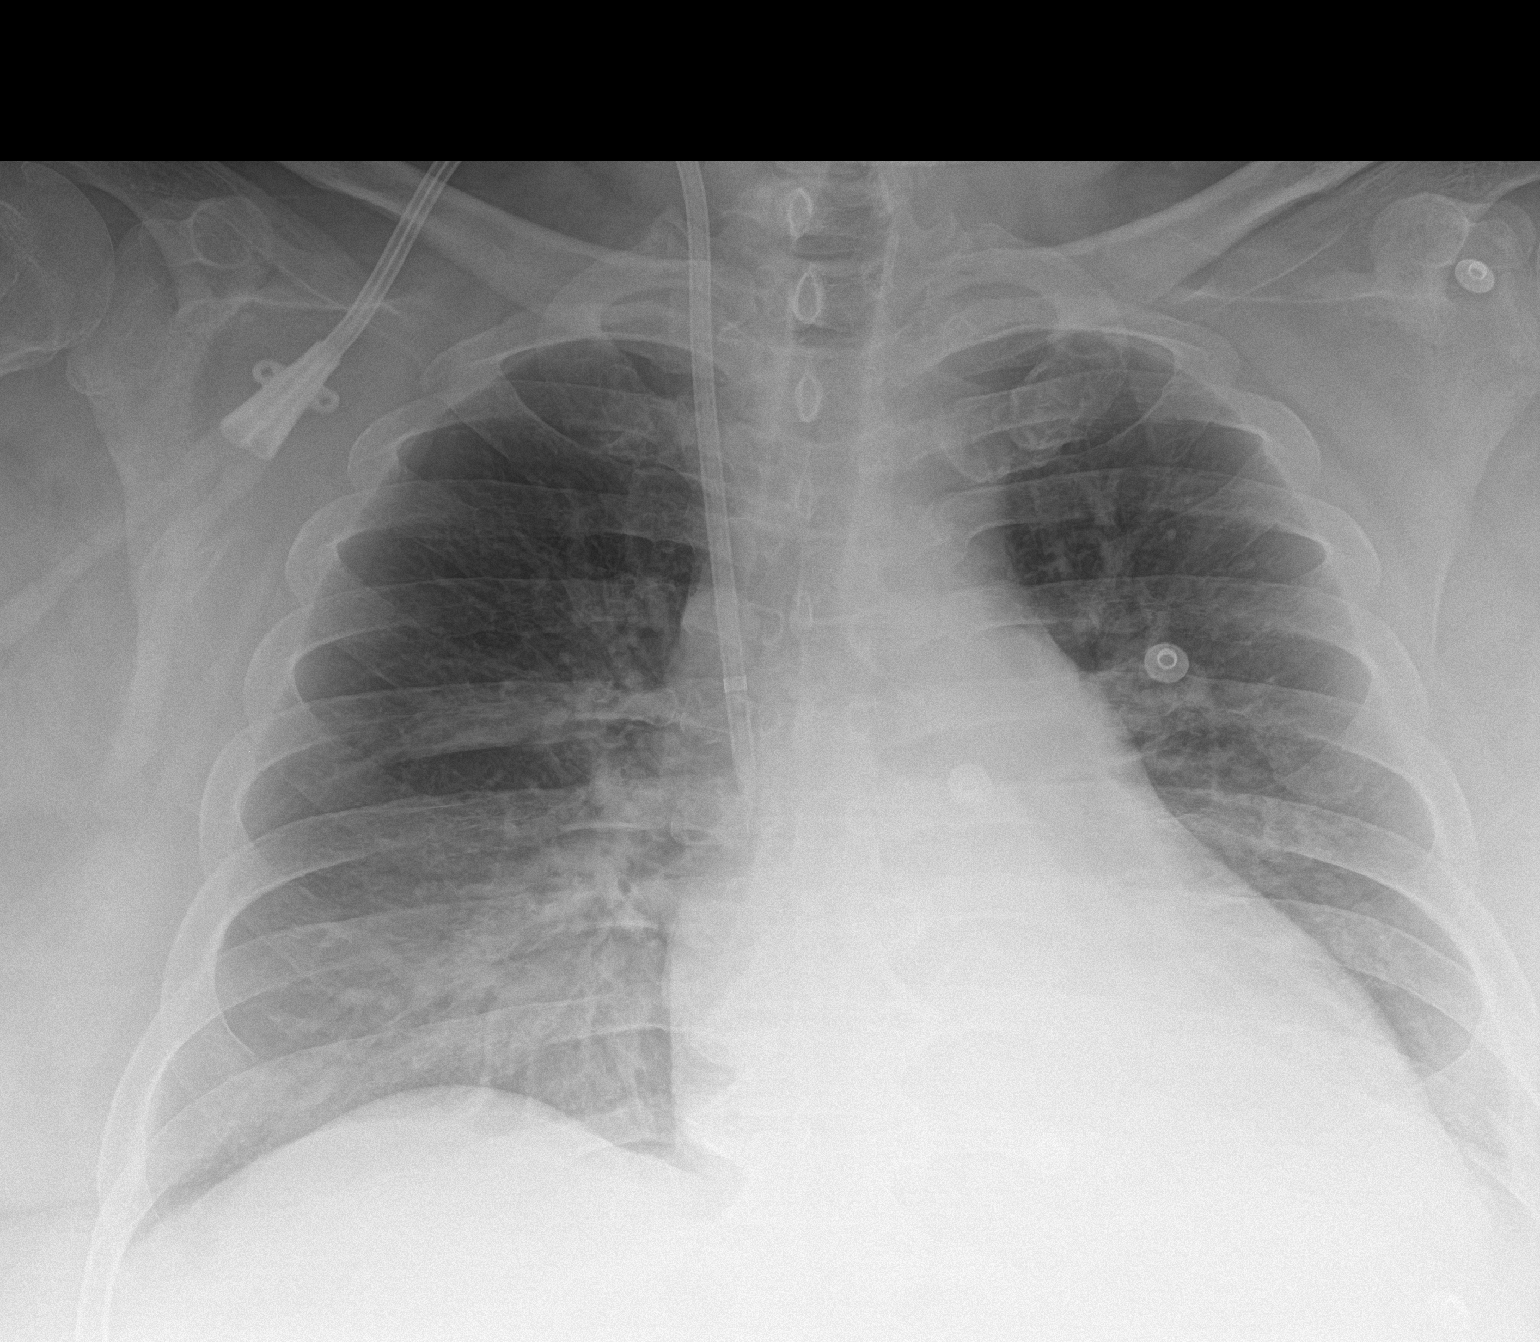

[1 of 1 positions shown; findings below may reference images not displayed]

FINDINGS: Right-sided hemodialysis catheter in place with tip overlying the
cavoatrial junction. Cardiomegaly, stable. Mediastinal silhouette
within normal limits.

Lungs normally inflated. Perihilar vascular congestion without frank
pulmonary edema. Small left pleural effusion. Hazy and patchy
bibasilar opacities could reflect atelectasis, infiltrate, or edema.
No pneumothorax.

No acute osseous finding.
IMPRESSION: 1. Cardiomegaly with mild perihilar vascular congestion without
frank pulmonary edema.
2. Associated small left pleural effusion.
3. Superimposed hazy and patchy bibasilar opacities, favored to
reflect atelectasis and/or edema. Superimposed infiltrates could be
considered in the correct clinical setting.

## 2019-02-04 MED ORDER — ACETAMINOPHEN 650 MG RE SUPP: 650.0000 mg | Freq: Four times a day (QID) | RECTAL | Status: DC | PRN

## 2019-02-04 MED ORDER — MELATONIN 3 MG PO TABS
4.5000 mg | ORAL_TABLET | Freq: Every day | ORAL | Status: DC
  Administered 2019-02-04 – 2019-02-07 (×4): 4.5 mg via ORAL
  Filled 2019-02-04 (×5): qty 1.5

## 2019-02-04 MED ORDER — HEPARIN SODIUM (PORCINE) 1000 UNIT/ML IJ SOLN
INTRAMUSCULAR | Status: AC
  Administered 2019-02-04: 3800 [IU]/h via INTRAVENOUS_CENTRAL
  Filled 2019-02-04: qty 4

## 2019-02-04 MED ORDER — CALCIUM CARBONATE ANTACID 500 MG PO CHEW: 1.0000 | CHEWABLE_TABLET | Freq: Every day | ORAL | Status: DC | PRN

## 2019-02-04 MED ORDER — CHLORHEXIDINE GLUCONATE CLOTH 2 % EX PADS
6.0000 | MEDICATED_PAD | Freq: Every day | CUTANEOUS | Status: DC
  Administered 2019-02-04 – 2019-02-07 (×4): 6 via TOPICAL

## 2019-02-04 MED ORDER — ALTEPLASE 2 MG IJ SOLR: 2.0000 mg | Freq: Once | INTRAMUSCULAR | Status: DC | PRN

## 2019-02-04 MED ORDER — INSULIN ASPART 100 UNIT/ML ~~LOC~~ SOLN: 0.0000 [IU] | Freq: Three times a day (TID) | SUBCUTANEOUS | Status: DC

## 2019-02-04 MED ORDER — ALBUTEROL SULFATE (2.5 MG/3ML) 0.083% IN NEBU: 3.0000 mL | INHALATION_SOLUTION | Freq: Four times a day (QID) | RESPIRATORY_TRACT | Status: DC | PRN

## 2019-02-04 MED ORDER — HEPARIN SODIUM (PORCINE) 1000 UNIT/ML DIALYSIS
1000.0000 [IU] | INTRAMUSCULAR | Status: DC | PRN
  Administered 2019-02-04: 3800 [IU]/h via INTRAVENOUS_CENTRAL

## 2019-02-04 MED ORDER — ONDANSETRON HCL 4 MG PO TABS: 4.0000 mg | ORAL_TABLET | Freq: Four times a day (QID) | ORAL | Status: DC | PRN

## 2019-02-04 MED ORDER — CALCITRIOL 0.25 MCG PO CAPS
0.2500 ug | ORAL_CAPSULE | Freq: Every day | ORAL | Status: DC
  Administered 2019-02-04 – 2019-02-08 (×5): 0.25 ug via ORAL
  Filled 2019-02-04 (×4): qty 1

## 2019-02-04 MED ORDER — SODIUM CHLORIDE 0.9 % IV SOLN: 100.0000 mL | INTRAVENOUS | Status: DC | PRN

## 2019-02-04 MED ORDER — CALCIUM GLUCONATE-NACL 2-0.675 GM/100ML-% IV SOLN
2.0000 g | Freq: Once | INTRAVENOUS | Status: AC
  Administered 2019-02-04: 2000 mg via INTRAVENOUS
  Filled 2019-02-04: qty 100

## 2019-02-04 MED ORDER — HYDROXYZINE HCL 25 MG PO TABS
25.0000 mg | ORAL_TABLET | Freq: Three times a day (TID) | ORAL | Status: DC | PRN
  Administered 2019-02-08: 25 mg via ORAL
  Filled 2019-02-04: qty 1

## 2019-02-04 MED ORDER — ACETAMINOPHEN 325 MG PO TABS
650.0000 mg | ORAL_TABLET | Freq: Four times a day (QID) | ORAL | Status: DC | PRN
  Administered 2019-02-04 – 2019-02-06 (×4): 650 mg via ORAL
  Filled 2019-02-04 (×4): qty 2

## 2019-02-04 MED ORDER — CITALOPRAM HYDROBROMIDE 40 MG PO TABS
40.0000 mg | ORAL_TABLET | Freq: Every day | ORAL | Status: DC
  Administered 2019-02-04 – 2019-02-08 (×5): 40 mg via ORAL
  Filled 2019-02-04 (×5): qty 1

## 2019-02-04 MED ORDER — GABAPENTIN 300 MG PO CAPS
300.0000 mg | ORAL_CAPSULE | Freq: Every day | ORAL | Status: DC
  Administered 2019-02-04 – 2019-02-07 (×4): 300 mg via ORAL
  Filled 2019-02-04 (×4): qty 1

## 2019-02-04 MED ORDER — ATORVASTATIN CALCIUM 40 MG PO TABS
40.0000 mg | ORAL_TABLET | Freq: Every day | ORAL | Status: DC
  Administered 2019-02-04 – 2019-02-08 (×5): 40 mg via ORAL
  Filled 2019-02-04 (×5): qty 1

## 2019-02-04 MED ORDER — AMLODIPINE BESYLATE 10 MG PO TABS
10.0000 mg | ORAL_TABLET | Freq: Every day | ORAL | Status: DC
  Administered 2019-02-04 – 2019-02-08 (×5): 10 mg via ORAL
  Filled 2019-02-04 (×5): qty 1

## 2019-02-04 MED ORDER — HEPARIN SODIUM (PORCINE) 5000 UNIT/ML IJ SOLN
5000.0000 [IU] | Freq: Three times a day (TID) | INTRAMUSCULAR | Status: DC
  Administered 2019-02-04 – 2019-02-08 (×12): 5000 [IU] via SUBCUTANEOUS
  Filled 2019-02-04 (×11): qty 1

## 2019-02-04 MED ORDER — ONDANSETRON HCL 4 MG/2ML IJ SOLN: 4.0000 mg | Freq: Four times a day (QID) | INTRAMUSCULAR | Status: DC | PRN

## 2019-02-04 MED ORDER — PANTOPRAZOLE SODIUM 40 MG PO TBEC
40.0000 mg | DELAYED_RELEASE_TABLET | Freq: Every day | ORAL | Status: DC
  Administered 2019-02-04 – 2019-02-08 (×5): 40 mg via ORAL
  Filled 2019-02-04 (×5): qty 1

## 2019-02-04 NOTE — Progress Notes (Signed)
Results for BEREN, YNIGUEZ (MRN [id number]) as of 02/04/2019 06:26  Ref. Range 02/04/2019 05:32  Calcium Latest Ref Range: 8.9 - 10.3 mg/dL 6.3 (LL)  MD on call paged.

## 2019-02-04 NOTE — Evaluation (Signed)
Physical Therapy Evaluation Patient Details Name: Michael Riggs MRN: [id number] DOB: 02-08-1971 Today's Date: 02/04/2019   History of Present Illness  Pt is a 48 y/o male admitted secondary to increased SOB weakness. Found to be in volume overload and in need of HD. PMH includes CKD, DM, HTN, L AV fistula, and s/p L BKA.   Clinical Impression  Pt admitted secondary to problem above with deficits below. Pt with increased swelling in scrotal area and in BLE that restricted ROM in BLE. Pt is L BKA at baseline and reports he has been unable to use prosthetic secondary to swelling. Pt requiring mod A to roll this session, however, pt with increased pain and "tightness" in scrotal area and LEs which prevented further mobility. Anticipate pt will increase tolerance once swelling and pain improves. Pt very motivated to regain independence and to be able to walk independently again. Feel he would be excellent candidate for CIR. Will continue to follow acutely to maximize functional mobility independence and safety.     Follow Up Recommendations CIR    Equipment Recommendations  None recommended by PT    Recommendations for Other Services Rehab consult;OT consult     Precautions / Restrictions Precautions Precautions: Fall;Other (comment) Precaution Comments: L BKA; prosthetic is at home  Restrictions Weight Bearing Restrictions: No      Mobility  Bed Mobility Overal bed mobility: Needs Assistance Bed Mobility: Rolling Rolling: Mod assist         General bed mobility comments: Mod A to roll this session. Heavy use of bed rails and pt reporting increased pain secondary to swelling in LE and scrotal area, so further mobility deferred.   Transfers                    Ambulation/Gait                Stairs            Wheelchair Mobility    Modified Rankin (Stroke Patients Only)       Balance                                              Pertinent Vitals/Pain Pain Assessment: Faces Faces Pain Scale: Hurts whole lot Pain Location: BLE, scrotal area with movement  Pain Descriptors / Indicators: Grimacing;Guarding;Sharp Pain Intervention(s): Limited activity within patient's tolerance;Monitored during session;Repositioned    Home Living Family/patient expects to be discharged to:: Private residence Living Arrangements: Parent Available Help at Discharge: Family Type of Home: House Home Access: Stairs to enter   Technical brewer of Steps: 1 Home Layout: Two level;Able to live on main level with bedroom/bathroom Home Equipment: Wheelchair - manual Additional Comments: Has prosthetic but reports it is at home. Reports he has not fit in prosthetic lately secondary to swelling.     Prior Function Level of Independence: Independent with assistive device(s)         Comments: Pt reports he used WC for primary mobility since swelling started, and was able to transfer independently. Prior to onset of swelling, pt able to ambulate independently with use of prosthetic.      Hand Dominance        Extremity/Trunk Assessment   Upper Extremity Assessment Upper Extremity Assessment: Defer to OT evaluation    Lower Extremity Assessment Lower Extremity Assessment: RLE deficits/detail;LLE deficits/detail RLE Deficits /  Details: Increased swelling noted in RLE. Unable to perform ankle pump secondary to swelling. Very limited knee ROM secondary to swelling.  RLE Sensation: decreased light touch LLE Deficits / Details: L BKA at baseline. Increased swelling noted which limited knee and hip ROM.        Communication   Communication: No difficulties  Cognition Arousal/Alertness: Awake/alert Behavior During Therapy: WFL for tasks assessed/performed Overall Cognitive Status: Within Functional Limits for tasks assessed                                        General Comments General comments (skin  integrity, edema, etc.): Pt asking multiple questions about current diet and discussed goals that he wants to be able to get back to doing.     Exercises     Assessment/Plan    PT Assessment Patient needs continued PT services  PT Problem List Decreased strength;Decreased balance;Decreased activity tolerance;Decreased range of motion;Decreased mobility;Decreased knowledge of precautions;Pain       PT Treatment Interventions DME instruction;Gait training;Functional mobility training;Therapeutic activities;Therapeutic exercise;Balance training;Patient/family education    PT Goals (Current goals can be found in the Care Plan section)  Acute Rehab PT Goals Patient Stated Goal: "to be able to walk to the bathroom and be able to take care of myself" PT Goal Formulation: With patient Time For Goal Achievement: 02/18/19 Potential to Achieve Goals: Good    Frequency Min 3X/week   Barriers to discharge        Co-evaluation               AM-PAC PT "6 Clicks" Mobility  Outcome Measure Help needed turning from your back to your side while in a flat bed without using bedrails?: A Lot Help needed moving from lying on your back to sitting on the side of a flat bed without using bedrails?: A Lot Help needed moving to and from a bed to a chair (including a wheelchair)?: Total Help needed standing up from a chair using your arms (e.g., wheelchair or bedside chair)?: Total Help needed to walk in hospital room?: Total Help needed climbing 3-5 steps with a railing? : Total 6 Click Score: 8    End of Session   Activity Tolerance: Patient limited by pain Patient left: in bed;with call bell/phone within reach;with bed alarm set Nurse Communication: Mobility status PT Visit Diagnosis: Muscle weakness (generalized) (M62.81);Difficulty in walking, not elsewhere classified (R26.2)    Time: 5643-3295 PT Time Calculation (min) (ACUTE ONLY): 20 min   Charges:   PT Evaluation $PT Eval  Moderate Complexity: [place], PT, DPT  Acute Rehabilitation Services  Pager: [phone] Office: [phone]   Rudean Hitt 02/04/2019, 12:25 PM

## 2019-02-04 NOTE — Procedures (Signed)
I evaluated the patient while on hemodialysis.  I reviewed the patient's treatment plan.  Patient tolerating dialysis well.  Discussed with RN.  

## 2019-02-04 NOTE — H&P (Signed)
History and Physical    Michael Riggs KVQ:[id number] DOB: [birth year] DOA: 02/04/2019  PCP: [name], MD   Patient coming from: Patient was [facility].  Chief Complaint: Shortness of breath and weakness.  HPI: Michael Riggs is a 48 y.o. male with history of chronic kidney disease was recently admitted a week ago for acute encephalopathy in the setting of uremia was started on dialysis at the time patient also was found to be hyperglycemic eventually discharged home yesterday with family was found to have increasing shortness of breath difficult to ambulate feeling weak presented back to the ER at [facility].  In the ER patient was appearing nonfocal afebrile labs were at baseline.  Chest x-ray showed mild vascular congestion.  Patient has skin tears on the right lower extremity.  Patient given that he has anasarca fluid overload will need dialysis and was transferred to [facility].  On exam patient is not in distress.  Appears anasarca.  Labs reviewed done at [facility] appears to be at baseline with anemia.  Patient otherwise denies any chest pain nausea vomiting abdominal pain or diarrhea.  COVID-19 test was negative.  ED Course: Patient was a direct admit.  Review of Systems: As per HPI, rest all negative.   Past Medical History:  Diagnosis Date  . Anemia   . Chronic kidney disease    Stage 5  . Depression   . Diabetes mellitus without complication ([place])   . Dyspnea   . GERD (gastroesophageal reflux disease)   . Hypertension     Past Surgical History:  Procedure Laterality Date  . AV FISTULA PLACEMENT Left 01/09/2019   Procedure: ARTERIOVENOUS (AV) FISTULA CREATION LEFT UPPER ARM;  Surgeon: [name], MD;  Location: [facility] OR;  Service: Vascular;  Laterality: Left;  . below the knee amputation Left   . EYE SURGERY    . IR FLUORO GUIDE CV LINE RIGHT  01/31/2019  . IR FLUORO GUIDE CV LINE RIGHT  02/03/2019  . IR US GUIDE VASC  ACCESS RIGHT  01/31/2019     reports that he has quit smoking. He has never used smokeless tobacco. He reports previous alcohol use. He reports previous drug use.  No Known Allergies  History reviewed. No pertinent family history.  Prior to Admission medications   Medication Sig Start Date End Date Taking? Authorizing Provider  albuterol (VENTOLIN HFA) 108 (90 Base) MCG/ACT inhaler Inhale into the lungs every 6 (six) hours as needed for wheezing or shortness of breath.    [provider]  amLODipine (NORVASC) 10 MG tablet Take 10 mg by mouth daily.    [provider]  atorvastatin (LIPITOR) 40 MG tablet Take 40 mg by mouth daily.  07/19/18   [provider]  calcium carbonate (TUMS - DOSED IN MG ELEMENTAL CALCIUM) 500 MG chewable tablet Chew 1 tablet by mouth daily as needed for indigestion or heartburn.     [provider]  citalopram (CELEXA) 40 MG tablet Take 40 mg by mouth daily.  12/13/16   [provider]  gabapentin (NEURONTIN) 300 MG capsule Take 300 mg by mouth 3 (three) times daily.  02/15/17   [provider]  guaiFENesin-dextromethorphan (ROBITUSSIN DM) 100-10 MG/5ML syrup Take 10 mLs by mouth every 4 (four) hours as needed for cough.    [provider]  hydrOXYzine (ATARAX/VISTARIL) 25 MG tablet Take 25 mg by mouth every 8 (eight) hours as needed for anxiety.  04/26/18  [provider]  Melatonin 5 MG CAPS Take 1 capsule by mouth at bedtime.    [provider]  omeprazole (PRILOSEC) 40 MG capsule Take 40 mg by mouth daily.    [provider]  ondansetron (ZOFRAN) 4 MG tablet Take 4 mg by mouth daily as needed for nausea or vomiting.  07/19/18   [provider]  oxyCODONE-acetaminophen (PERCOCET) 5-325 MG tablet Take 1 tablet by mouth every 6 (six) hours as needed for severe pain. 01/09/19   Rhyne, Hulen Shouts, PA-C  traMADol (ULTRAM) 50 MG tablet Take by mouth every 6 (six) hours as  needed.    [provider]    Physical Exam: Constitutional: Moderately built and nourished. Vitals:   02/04/19 0434  BP: 132/79  Pulse: 82  Resp: (!) 22  Temp: 98.4 F (36.9 C)  TempSrc: Oral  SpO2: 100%  Weight: (!) 142.3 kg  Height: 5\' 10"  (1.778 m)   Eyes: Anicteric no pallor. ENMT: No discharge from the ears eyes nose or mouth. Neck: No mass felt.  No neck rigidity. Respiratory: No rhonchi or crepitations. Cardiovascular: S1-S2 heard. Abdomen: Soft nontender bowel sounds present.  Scrotal edema. Musculoskeletal: Bilateral lower extremity mild left BKA. Skin: Multiple skin tears on the right lower extremity below the knee. Neurologic: Alert awake oriented to time place and person.  Moves all extremities. Psychiatric: Appears normal.  Normal affect.   Labs on Admission: I have personally reviewed following labs and imaging studies  CBC: Recent Labs  Lab 01/31/19 0248 01/31/19 1120 02/01/19 1002 02/02/19 0322 02/03/19 0342  WBC 14.3* 14.8* 15.6* 14.9* 12.6*  NEUTROABS 11.8*  --   --   --   --   HGB 7.8* 7.9* 7.6* 7.5* 7.9*  HCT 24.7* 25.5* 23.4* 23.3* 25.1*  MCV 96.9 98.1 93.2 93.2 95.1  PLT 218 219 190 201 254   Basic Metabolic Panel: Recent Labs  Lab 01/30/19 1756  01/30/19 2339 01/31/19 0248 02/01/19 0718 02/02/19 0322 02/03/19 0342  NA 136   < > 136 137 141 137 134*  K 6.4*   < > 6.1* 5.9* 5.0 4.1 4.0  CL 109  --   --  105 107 102 100  CO2 9*  --   --  15* 19* 20* 24  GLUCOSE 73  --   --  209* 105* 116* 126*  BUN 154*  --   --  152* 122* 84* 50*  CREATININE 19.78*  --   --  19.14* 16.01* 11.77* 8.04*  CALCIUM 6.9*  --   --  6.7* 6.2* 6.1* 6.4*  PHOS  --   --   --  >30.0* 8.9* 6.1* 4.2   < > = values in this interval not displayed.   GFR: Estimated Creatinine Clearance: 16.4 mL/min (A) (by C-G formula based on SCr of 8.04 mg/dL (H)). Liver Function Tests: Recent Labs  Lab 01/30/19 2042 01/31/19 0248 02/01/19 0718 02/02/19 0322  02/03/19 0342  AST 17 16  --   --   --   ALT 10 11  --   --   --   ALKPHOS 68 67  --   --   --   BILITOT 0.7 0.7  --   --   --   PROT 6.8 6.3*  --   --   --   ALBUMIN 2.0* 1.9* 1.6* 1.6* 1.5*   No results for input(s): LIPASE, AMYLASE in the last 168 hours. Recent Labs  Lab 01/30/19 2042  AMMONIA  28   Coagulation Profile: Recent Labs  Lab 01/31/19 0248  INR 1.4*   Cardiac Enzymes: No results for input(s): CKTOTAL, CKMB, CKMBINDEX, TROPONINI in the last 168 hours. BNP (last 3 results) No results for input(s): PROBNP in the last 8760 hours. HbA1C: No results for input(s): HGBA1C in the last 72 hours. CBG: Recent Labs  Lab 02/02/19 0756 02/02/19 0846 02/02/19 1146 02/02/19 1638 02/04/19 0439  GLUCAP 44* 116* 97 90 83   Lipid Profile: No results for input(s): CHOL, HDL, LDLCALC, TRIG, CHOLHDL, LDLDIRECT in the last 72 hours. Thyroid Function Tests: No results for input(s): TSH, T4TOTAL, FREET4, T3FREE, THYROIDAB in the last 72 hours. Anemia Panel: No results for input(s): VITAMINB12, FOLATE, FERRITIN, TIBC, IRON, RETICCTPCT in the last 72 hours. Urine analysis:    Component Value Date/Time   COLORURINE YELLOW 01/31/2019 2315   APPEARANCEUR CLOUDY (A) 01/31/2019 2315   LABSPEC 1.012 01/31/2019 2315   PHURINE 5.0 01/31/2019 2315   GLUCOSEU 50 (A) 01/31/2019 2315   HGBUR SMALL (A) 01/31/2019 2315   BILIRUBINUR NEGATIVE 01/31/2019 2315   KETONESUR NEGATIVE 01/31/2019 2315   PROTEINUR >=300 (A) 01/31/2019 2315   NITRITE NEGATIVE 01/31/2019 2315   LEUKOCYTESUR NEGATIVE 01/31/2019 2315   Sepsis Labs: @LABRCNTIP (procalcitonin:4,lacticidven:4) ) Recent Results (from the past 240 hour(s))  SARS Coronavirus 2 (CEPHEID - Performed in [place] hospital lab), Hosp Order     Status: None   Collection Time: 01/30/19  7:58 PM   Specimen: Nasopharyngeal Swab  Result Value Ref Range Status   SARS Coronavirus 2 NEGATIVE NEGATIVE Final    Comment: (NOTE) If result is  NEGATIVE SARS-CoV-2 target nucleic acids are NOT DETECTED. The SARS-CoV-2 RNA is generally detectable in upper and lower  respiratory specimens during the acute phase of infection. The lowest  concentration of SARS-CoV-2 viral copies this assay can detect is 250  copies / mL. A negative result does not preclude SARS-CoV-2 infection  and should not be used as the sole basis for treatment or other  patient management decisions.  A negative result may occur with  improper specimen collection / handling, submission of specimen other  than nasopharyngeal swab, presence of viral mutation(s) within the  areas targeted by this assay, and inadequate number of viral copies  (<250 copies / mL). A negative result must be combined with clinical  observations, patient history, and epidemiological information. If result is POSITIVE SARS-CoV-2 target nucleic acids are DETECTED. The SARS-CoV-2 RNA is generally detectable in upper and lower  respiratory specimens dur ing the acute phase of infection.  Positive  results are indicative of active infection with SARS-CoV-2.  Clinical  correlation with patient history and other diagnostic information is  necessary to determine patient infection status.  Positive results do  not rule out bacterial infection or co-infection with other viruses. If result is PRESUMPTIVE POSTIVE SARS-CoV-2 nucleic acids MAY BE PRESENT.   A presumptive positive result was obtained on the submitted specimen  and confirmed on repeat testing.  While 2019 novel coronavirus  (SARS-CoV-2) nucleic acids may be present in the submitted sample  additional confirmatory testing may be necessary for epidemiological  and / or clinical management purposes  to differentiate between  SARS-CoV-2 and other Sarbecovirus currently known to infect humans.  If clinically indicated additional testing with an alternate test  methodology [phone]) is advised. The SARS-CoV-2 RNA is generally  detectable  in upper and lower respiratory sp ecimens during the acute  phase of infection. The expected result is Negative.  Fact Sheet for Patients:  StrictlyIdeas.no Fact Sheet for Healthcare Providers: BankingDealers.co.za This test is not yet approved or cleared by the [place] FDA and has been authorized for detection and/or diagnosis of SARS-CoV-2 by FDA under an Emergency Use Authorization (EUA).  This EUA will remain in effect (meaning this test can be used) for the duration of the COVID-19 declaration under Section 564(b)(1) of the Act, 21 U.S.C. section 360bbb-3(b)(1), unless the authorization is terminated or revoked sooner. Performed at [facility], [place] [address] [place], [place] [address]   MRSA PCR Screening     Status: None   Collection Time: 01/31/19  3:24 AM   Specimen: Nasopharyngeal  Result Value Ref Range Status   MRSA by PCR NEGATIVE NEGATIVE Final    Comment:        The GeneXpert MRSA Assay (FDA approved for NASAL specimens only), is one component of a comprehensive MRSA colonization surveillance program. It is not intended to diagnose MRSA infection nor to guide or monitor treatment for MRSA infections. Performed at [facility], [place] [address], [place], [place] [address]      Radiological Exams on Admission: Ir Fluoro Guide Cv Line Right  Result Date: 02/03/2019 CLINICAL DATA:  Renal failure needs durable venous access for hemodialysis. Temporary IJ catheter in place. EXAM: TUNNELED HEMODIALYSIS CATHETER PLACEMENT WITH ULTRASOUND AND FLUOROSCOPIC GUIDANCE TECHNIQUE: The procedure, risks, benefits, and alternatives were explained to the patient. Questions regarding the procedure were encouraged and answered. The patient understands and consents to the procedure. As antibiotic prophylaxis, cefazolin 2 g was ordered pre-procedure and administered intravenously within one hour of incision. Previously  placed right IJ catheter and surrounding skin prepped using maximum barrier technique including cap and mask, sterile gown, sterile gloves, large sterile sheet, and Chlorhexidine as cutaneous antisepsis. The region was infiltrated locally with 1% lidocaine. Intravenous Fentanyl 82mcg and Versed 2mg  were administered as conscious sedation during continuous monitoring of the patient's level of consciousness and physiological / cardiorespiratory status by the radiology RN, with a total moderate sedation time of 15 minutes. A Bentson wire was advanced through the catheter into the IVC, exchanged for an MPA catheter . A Palindrome 23 hemodialysis catheter was tunneled from the right anterior chest wall approach to the right IJ dermatotomy site. The MPA catheter was exchanged over an Amplatz wire for serial vascular dilators which allow placement of a peel-away sheath, through which the catheter was advanced under intermittent fluoroscopy, positioned with its tips at the cavoatrial junction. Spot chest radiograph confirms good catheter position. No pneumothorax. Catheter was flushed and primed per protocol. Catheter secured externally with O Prolene sutures. COMPLICATIONS: COMPLICATIONS None immediate FLUOROSCOPY TIME:  1 minutes; 257  uGym2 DAP COMPARISON:  None IMPRESSION: 1. Technically successful placement of tunneled right IJ hemodialysis catheter with ultrasound and fluoroscopic guidance. Ready for routine use. ACCESS: Remains approachable for percutaneous intervention as needed. Electronically Signed   By: Lucrezia Europe M.D.   On: 02/03/2019 10:34    EKG: Independently reviewed.  Normal sinus rhythm prolonged QTC.  Assessment/Plan Principal Problem:   Fluid overload Active Problems:   Diabetes mellitus type 2 in obese (HCC)   Syphilis   Macrocytic anemia   ESRD (end stage renal disease) ([place])    1. Anasarca fluid overload in a patient with ESRD newly started on dialysis last week -consult nephrology for  dialysis.  Fluid management per nephrology.  Labs reviewed no hyperkalemia and patient is not in acute respiratory distress.  2. Multiple skin tears and ulceration of the right lower extremity for which we will consult wound team.  Does not look infected. 3. Hypertension on amlodipine. 4. Hyperlipidemia on statins. 5. History of diabetes mellitus type 2 with [place] admission patient was hypoglycemic hemoglobin A1c was 5 was managed without any sliding scale coverage during last admission.  Closely observe. 6. Anemia likely from renal disease follow CBC. 7. Recently diagnosed syphilis had received 1 dose of insulin about 2 weeks ago prior to last admission and will need further management for this.   DVT prophylaxis: Heparin. Code Status: Full code. Family Communication: We will need to discuss with family. Disposition Plan: To be determined. Consults called: Physical therapy. Admission status: Observation.   Rise Patience MD [facility] Pager [phone].  If 7PM-7AM, please contact night-coverage [email or web address] Password [facility]  02/04/2019, 5:47 AM

## 2019-02-04 NOTE — Progress Notes (Signed)
New Admission Note:  Arrival Method: Stretcher Mental Orientation: Alert and oriented x 4 Telemetry: N/A Assessment: Completed Skin: Warm and dry. Left BKA, Rt leg with multiple ulcers anterior and posterior, Blister to inner side on rt leg. Very swollen scrotum.  IV: NSL Pain: Denies Tubes: N/s Safety Measures: Safety Fall Prevention Plan initiated.  Admission: Completed 5 M  Orientation: Patient has been orientated to the room, unit and the staff. Welcome booklet given.  Family:N/A Orders have been reviewed and implemented. Will continue to monitor the patient. Call light has been placed within reach and bed alarm has been activated.   Sima Matas BSN, RN  Phone Number: [phone]

## 2019-02-04 NOTE — Progress Notes (Signed)
[facility]    NEPHROLOGY PROGRESS NOTE  SUBJECTIVE: Patient seen and examined.  Patient discharged yesterday morning, and ultimately reported he was having difficulty walking.  He also had increased shortness of breath and generalized weakness, and he went to the ER at [facility].  He was transferred here for dialysis.  He was noted to have skin tears on his lower extremities.  A chest x-ray at [facility] revealed mild vascular congestion.  He reported that he is unable to walk at home due to scrotal swelling.     OBJECTIVE:  Vitals:   02/04/19 0434 02/04/19 1002  BP: 132/79 127/72  Pulse: 82 81  Resp: (!) 22 18  Temp: 98.4 F (36.9 C) 98.7 F (37.1 C)  SpO2: 100% 97%    Intake/Output Summary (Last 24 hours) at 02/04/2019 1152 Last data filed at 02/04/2019 0700 Gross per 24 hour  Intake 420 ml  Output 0 ml  Net 420 ml      General:  AAOx3 NAD HEENT: MMM [place] AT anicteric sclera Neck:  No JVD, no adenopathy CV:  Heart RRR  Lungs:  L/S CTA bilaterally Abd:  abd SNT/ND with normal BS GU:  Bladder non-palpable, positive scrotal edema Extremities: +1 bilateral lower extremity edema, multiple skin tears noted to bilateral lower extremities.  Brawny skin changes noted to bilateral lower extremities. Skin:  No skin rash  MEDICATIONS:  . amLODipine  10 mg Oral Daily  . atorvastatin  40 mg Oral Daily  . Chlorhexidine Gluconate Cloth  6 each Topical Q0600  . citalopram  40 mg Oral Daily  . gabapentin  300 mg Oral QHS  . heparin  5,000 Units Subcutaneous Q8H  . Melatonin  4.5 mg Oral QHS  . pantoprazole  40 mg Oral Daily       LABS:   CBC Latest Ref Rng & Units 02/04/2019 02/04/2019 02/03/2019  WBC 4.0 - 10.5 K/uL 13.3(H) 13.5(H) 12.6(H)  Hemoglobin 13.0 - 17.0 g/dL 8.0(L) 7.8(L) 7.9(L)  Hematocrit 39.0 - 52.0 % 26.0(L) 25.2(L) 25.1(L)  Platelets 150 - 400 K/uL 202 200 196    CMP Latest Ref Rng & Units 02/04/2019 02/04/2019 02/03/2019  Glucose 70 - 99  mg/dL - 98 126(H)  BUN 6 - 20 mg/dL - 57(H) 50(H)  Creatinine 0.61 - 1.24 mg/dL 8.81(H) 8.82(H) 8.04(H)  Sodium 135 - 145 mmol/L - 133(L) 134(L)  Potassium 3.5 - 5.1 mmol/L - 3.8 4.0  Chloride 98 - 111 mmol/L - 100 100  CO2 22 - 32 mmol/L - 21(L) 24  Calcium 8.9 - 10.3 mg/dL - 6.3(LL) 6.4(LL)  Total Protein 6.5 - 8.1 g/dL - 5.6(L) -  Total Bilirubin 0.3 - 1.2 mg/dL - 0.3 -  Alkaline Phos 38 - 126 U/L - 74 -  AST 15 - 41 U/L - 26 -  ALT 0 - 44 U/L - 7 -    Lab Results  Component Value Date   PTH 144 (H) 02/03/2019   CALCIUM 6.3 (LL) 02/04/2019   CAION 1.01 (L) 01/30/2019   PHOS 4.2 02/03/2019       Component Value Date/Time   COLORURINE YELLOW 01/31/2019 2315   APPEARANCEUR CLOUDY (A) 01/31/2019 2315   LABSPEC 1.012 01/31/2019 2315   PHURINE 5.0 01/31/2019 2315   GLUCOSEU 50 (A) 01/31/2019 2315   HGBUR SMALL (A) 01/31/2019 2315   BILIRUBINUR NEGATIVE 01/31/2019 2315   KETONESUR NEGATIVE 01/31/2019 2315   PROTEINUR >=300 (A) 01/31/2019 2315   NITRITE NEGATIVE 01/31/2019 2315  LEUKOCYTESUR NEGATIVE 01/31/2019 2315      Component Value Date/Time   PHART 7.221 (L) 01/31/2019 1115   PCO2ART 34.9 01/31/2019 1115   PO2ART 80.0 (L) 01/31/2019 1115   HCO3 13.8 (L) 01/31/2019 1115   TCO2 12 (L) 01/30/2019 2339   ACIDBASEDEF 12.4 (H) 01/31/2019 1115   O2SAT 93.8 01/31/2019 1115       Component Value Date/Time   IRON 65 01/31/2019 0248   TIBC 119 (L) 01/31/2019 0248   FERRITIN 284 01/31/2019 0248   IRONPCTSAT 55 (H) 01/31/2019 0248       ASSESSMENT/PLAN:    1.  End-stage renal disease on hemodialysis.  Plan for Tuesday, Thursday, Saturday dialysis.  Has dialysis planned in [place].  Is dialyzing via a tunneled catheter while AV fistula matures.  Will attempt to ultrafilter more on hemodialysis.  2.  Scrotal edema.  Hopefully will improve with ultrafiltration.  Would plan to elevate while in bed.  3.  Ambulatory dysfunction.  Recommend PT evaluation.  May need transfer  to rehab.  4.  Hypocalcemia.  Continue calcitriol and calcium carbonate.  PTH on 02/03/2019 was 144.    [place], DO, [place]

## 2019-02-04 NOTE — Progress Notes (Signed)
[name], male   DOB: Jun 16, 1971, 48 y.o.   MRN: [id number] Patient was admitted early this morning for shortness of breath and weakness.  He was discharged on 02/03/2019 but presented back to [facility] ER and subsequently transferred to [facility] for probable need for dialysis.  Patient seen and examined at bedside and plan of care discussed with him.  I have reviewed patient's medical records and this morning is unremarkable.  I have spoken to Dr. Andres Labrum and requested a nephrology consult.  I have reviewed current medications and labs myself.  We will repeat a.m. labs.  Patient is hypocalcemic.  This is being replaced.  Will get physical therapy evaluation for extreme weakness.

## 2019-02-05 DIAGNOSIS — A539 Syphilis, unspecified: Secondary | ICD-10-CM | POA: Diagnosis present

## 2019-02-05 DIAGNOSIS — I120 Hypertensive chronic kidney disease with stage 5 chronic kidney disease or end stage renal disease: Secondary | ICD-10-CM | POA: Diagnosis present

## 2019-02-05 DIAGNOSIS — R5381 Other malaise: Secondary | ICD-10-CM | POA: Diagnosis not present

## 2019-02-05 DIAGNOSIS — F329 Major depressive disorder, single episode, unspecified: Secondary | ICD-10-CM | POA: Diagnosis present

## 2019-02-05 DIAGNOSIS — L97911 Non-pressure chronic ulcer of unspecified part of right lower leg limited to breakdown of skin: Secondary | ICD-10-CM | POA: Diagnosis not present

## 2019-02-05 DIAGNOSIS — D539 Nutritional anemia, unspecified: Secondary | ICD-10-CM | POA: Diagnosis not present

## 2019-02-05 DIAGNOSIS — E1165 Type 2 diabetes mellitus with hyperglycemia: Secondary | ICD-10-CM | POA: Diagnosis not present

## 2019-02-05 DIAGNOSIS — Z87891 Personal history of nicotine dependence: Secondary | ICD-10-CM | POA: Diagnosis not present

## 2019-02-05 DIAGNOSIS — E785 Hyperlipidemia, unspecified: Secondary | ICD-10-CM | POA: Diagnosis not present

## 2019-02-05 DIAGNOSIS — L97919 Non-pressure chronic ulcer of unspecified part of right lower leg with unspecified severity: Secondary | ICD-10-CM | POA: Diagnosis present

## 2019-02-05 DIAGNOSIS — Z9181 History of falling: Secondary | ICD-10-CM | POA: Diagnosis not present

## 2019-02-05 DIAGNOSIS — E669 Obesity, unspecified: Secondary | ICD-10-CM | POA: Diagnosis not present

## 2019-02-05 DIAGNOSIS — N390 Urinary tract infection, site not specified: Secondary | ICD-10-CM | POA: Diagnosis not present

## 2019-02-05 DIAGNOSIS — E1169 Type 2 diabetes mellitus with other specified complication: Secondary | ICD-10-CM | POA: Diagnosis not present

## 2019-02-05 DIAGNOSIS — N5082 Scrotal pain: Secondary | ICD-10-CM | POA: Diagnosis not present

## 2019-02-05 DIAGNOSIS — I10 Essential (primary) hypertension: Secondary | ICD-10-CM | POA: Diagnosis not present

## 2019-02-05 DIAGNOSIS — D638 Anemia in other chronic diseases classified elsewhere: Secondary | ICD-10-CM | POA: Diagnosis not present

## 2019-02-05 DIAGNOSIS — R0989 Other specified symptoms and signs involving the circulatory and respiratory systems: Secondary | ICD-10-CM | POA: Diagnosis not present

## 2019-02-05 DIAGNOSIS — E871 Hypo-osmolality and hyponatremia: Secondary | ICD-10-CM | POA: Diagnosis not present

## 2019-02-05 DIAGNOSIS — I70232 Atherosclerosis of native arteries of right leg with ulceration of calf: Secondary | ICD-10-CM | POA: Diagnosis not present

## 2019-02-05 DIAGNOSIS — N186 End stage renal disease: Secondary | ICD-10-CM | POA: Diagnosis not present

## 2019-02-05 DIAGNOSIS — K5900 Constipation, unspecified: Secondary | ICD-10-CM | POA: Diagnosis not present

## 2019-02-05 DIAGNOSIS — K5901 Slow transit constipation: Secondary | ICD-10-CM | POA: Diagnosis not present

## 2019-02-05 DIAGNOSIS — G4733 Obstructive sleep apnea (adult) (pediatric): Secondary | ICD-10-CM | POA: Diagnosis not present

## 2019-02-05 DIAGNOSIS — Z6841 Body Mass Index (BMI) 40.0 and over, adult: Secondary | ICD-10-CM | POA: Diagnosis not present

## 2019-02-05 DIAGNOSIS — D631 Anemia in chronic kidney disease: Secondary | ICD-10-CM | POA: Diagnosis not present

## 2019-02-05 DIAGNOSIS — E1122 Type 2 diabetes mellitus with diabetic chronic kidney disease: Secondary | ICD-10-CM | POA: Diagnosis not present

## 2019-02-05 DIAGNOSIS — N5089 Other specified disorders of the male genital organs: Secondary | ICD-10-CM | POA: Diagnosis present

## 2019-02-05 DIAGNOSIS — E8770 Fluid overload, unspecified: Secondary | ICD-10-CM | POA: Diagnosis not present

## 2019-02-05 DIAGNOSIS — E1152 Type 2 diabetes mellitus with diabetic peripheral angiopathy with gangrene: Secondary | ICD-10-CM | POA: Diagnosis not present

## 2019-02-05 DIAGNOSIS — Z89512 Acquired absence of left leg below knee: Secondary | ICD-10-CM | POA: Diagnosis not present

## 2019-02-05 DIAGNOSIS — N2581 Secondary hyperparathyroidism of renal origin: Secondary | ICD-10-CM | POA: Diagnosis not present

## 2019-02-05 DIAGNOSIS — Z992 Dependence on renal dialysis: Secondary | ICD-10-CM | POA: Diagnosis not present

## 2019-02-05 DIAGNOSIS — Z1159 Encounter for screening for other viral diseases: Secondary | ICD-10-CM | POA: Diagnosis not present

## 2019-02-05 DIAGNOSIS — R339 Retention of urine, unspecified: Secondary | ICD-10-CM | POA: Diagnosis not present

## 2019-02-05 DIAGNOSIS — Z79899 Other long term (current) drug therapy: Secondary | ICD-10-CM | POA: Diagnosis not present

## 2019-02-05 DIAGNOSIS — A530 Latent syphilis, unspecified as early or late: Secondary | ICD-10-CM | POA: Diagnosis present

## 2019-02-05 DIAGNOSIS — K219 Gastro-esophageal reflux disease without esophagitis: Secondary | ICD-10-CM | POA: Diagnosis not present

## 2019-02-05 DIAGNOSIS — G479 Sleep disorder, unspecified: Secondary | ICD-10-CM | POA: Diagnosis not present

## 2019-02-05 DIAGNOSIS — N471 Phimosis: Secondary | ICD-10-CM | POA: Diagnosis not present

## 2019-02-05 DIAGNOSIS — D72829 Elevated white blood cell count, unspecified: Secondary | ICD-10-CM | POA: Diagnosis present

## 2019-02-05 LAB — BASIC METABOLIC PANEL
Anion gap: 10 (ref 5–15)
BUN: 32 mg/dL — ABNORMAL HIGH (ref 6–20)
CO2: 25 mmol/L (ref 22–32)
Calcium: 6.3 mg/dL — CL (ref 8.9–10.3)
Chloride: 98 mmol/L (ref 98–111)
Creatinine, Ser: 5.96 mg/dL — ABNORMAL HIGH (ref 0.61–1.24)
GFR calc Af Amer: 12 mL/min — ABNORMAL LOW (ref >60)
GFR calc non Af Amer: 10 mL/min — ABNORMAL LOW (ref >60)
Glucose, Bld: 134 mg/dL — ABNORMAL HIGH (ref 70–99)
Potassium: 3.6 mmol/L (ref 3.5–5.1)
Sodium: 133 mmol/L — ABNORMAL LOW (ref 135–145)

## 2019-02-05 LAB — CBC WITH DIFFERENTIAL/PLATELET
Abs Immature Granulocytes: 0.45 10*3/uL — ABNORMAL HIGH (ref 0.00–0.07)
Basophils Absolute: 0 10*3/uL (ref 0.0–0.1)
Basophils Relative: 0 %
Eosinophils Absolute: 0.5 10*3/uL (ref 0.0–0.5)
Eosinophils Relative: 5 %
HCT: 23.6 % — ABNORMAL LOW (ref 39.0–52.0)
Hemoglobin: 7.3 g/dL — ABNORMAL LOW (ref 13.0–17.0)
Immature Granulocytes: 4 %
Lymphocytes Relative: 10 %
Lymphs Abs: 1.2 10*3/uL (ref 0.7–4.0)
MCH: 29.8 pg (ref 26.0–34.0)
MCHC: 30.9 g/dL (ref 30.0–36.0)
MCV: 96.3 fL (ref 80.0–100.0)
Monocytes Absolute: 1.4 10*3/uL — ABNORMAL HIGH (ref 0.1–1.0)
Monocytes Relative: 12 %
Neutro Abs: 8.3 10*3/uL — ABNORMAL HIGH (ref 1.7–7.7)
Neutrophils Relative %: 69 %
Platelets: 146 10*3/uL — ABNORMAL LOW (ref 150–400)
RBC: 2.45 MIL/uL — ABNORMAL LOW (ref 4.22–5.81)
RDW: 12.8 % (ref 11.5–15.5)
WBC: 11.8 10*3/uL — ABNORMAL HIGH (ref 4.0–10.5)
nRBC: 0 % (ref 0.0–0.2)

## 2019-02-05 LAB — GLUCOSE, CAPILLARY
Glucose-Capillary: 157 mg/dL — ABNORMAL HIGH (ref 70–99)
Glucose-Capillary: 126 mg/dL — ABNORMAL HIGH (ref 70–99)
Glucose-Capillary: 109 mg/dL — ABNORMAL HIGH (ref 70–99)
Glucose-Capillary: 135 mg/dL — ABNORMAL HIGH (ref 70–99)

## 2019-02-05 MED ORDER — SODIUM CHLORIDE 0.9 % IV SOLN
62.5000 mg | INTRAVENOUS | Status: DC
  Administered 2019-02-07: 62.5 mg via INTRAVENOUS
  Filled 2019-02-05 (×2): qty 5

## 2019-02-05 MED ORDER — CALCIUM GLUCONATE-NACL 2-0.675 GM/100ML-% IV SOLN
2.0000 g | Freq: Once | INTRAVENOUS | Status: AC
  Administered 2019-02-05: 2000 mg via INTRAVENOUS
  Filled 2019-02-05: qty 100

## 2019-02-05 MED ORDER — GUAIFENESIN-DM 100-10 MG/5ML PO SYRP
10.0000 mL | ORAL_SOLUTION | ORAL | Status: DC | PRN
  Administered 2019-02-05 – 2019-02-06 (×3): 10 mL via ORAL
  Filled 2019-02-05 (×4): qty 10

## 2019-02-05 MED ORDER — DARBEPOETIN ALFA 100 MCG/0.5ML IJ SOSY
100.0000 ug | PREFILLED_SYRINGE | INTRAMUSCULAR | Status: DC
  Administered 2019-02-07: 16:00:00 100 ug via INTRAVENOUS
  Filled 2019-02-05: qty 0.5

## 2019-02-05 NOTE — Progress Notes (Signed)
[name], male   DOB: 08-02-1970, 48 y.o.   MRN: [id number]  PROGRESS NOTE    Michael Riggs  [name] DOB: 04-14-1971 DOA: 02/04/2019 PCP: [name], MD   Brief Narrative:  48 year old male with history of chronic renal disease who was recently admitted for acute metabolic encephalopathy in the setting of uremia, was started on dialysis and discharged home on 02/03/2019 presented on 02/03/2021 [facility] for shortness of breath and weakness and was transferred to [facility] for the need for requiring dialysis.  He was found to have mild vascular congestion on chest x-ray.  Initial COVID-19 testing was negative at [facility].  Assessment & Plan:   End-stage renal disease recently started on dialysis Anasarca along with volume overload -Nephrology following.  Dialysis as per nephrology schedule.  Hypocalcemia -Replace.  Repeat a.m. labs  Generalized deconditioning with ambulatory dysfunction -PT recommends CIR.  CIR consulted.  Hypertension -Continue amlodipine.  Blood pressure stable  Hyperlipidemia -Continue statin  Diabetes mellitus type 2 -Recent hemoglobin was 5.  Blood sugars stable.  Continue CBGs with SSI  Leukocytosis-probably reactive.  Monitor  Multiple skin tears and ulceration of the right lower extremity -Consult wound care team.  Anemia of chronic disease-hemoglobin stable.  Monitor  History of syphilis -Treated as an outpatient with penicillin [facility] recently.  Outpatient follow-up.  Morbid obesity  -Outpatient follow-up  DVT prophylaxis: Heparin Code Status: Full Family Communication: Spoke to patient at bedside Disposition Plan: Probable CIR in 1 to 2 days once cleared by nephrology  Consultants: Nephrology  Procedures: None  Antimicrobials: None   Subjective: Patient seen and examined at bedside.  She feels slightly better.  Complains of intermittent cough.  No overnight fever or  vomiting.  Objective: Vitals:   02/04/19 1743 02/04/19 2004 02/05/19 0333 02/05/19 0853  BP: 130/75 135/78 112/67 115/75  Pulse: 80 81 78 75  Resp: 18 16 16 16   Temp: 98.5 F (36.9 C) 98.2 F (36.8 C) 98.7 F (37.1 C) (!) 97.5 F (36.4 C)  TempSrc: Oral Oral Oral Oral  SpO2: 99% 100% 98% 98%  Weight:   (!) 141.1 kg   Height:        Intake/Output Summary (Last 24 hours) at 02/05/2019 1024 Last data filed at 02/05/2019 0900 Gross per 24 hour  Intake 1100 ml  Output 4000 ml  Net -2900 ml   Filed Weights   02/04/19 1257 02/04/19 1712 02/05/19 0333  Weight: (!) 144.1 kg (!) 140.9 kg (!) 141.1 kg    Examination:  General exam: Appears calm and comfortable.  Looks older than stated age.  Respiratory system: Bilateral decreased breath sounds at bases with basilar crackles Cardiovascular system: S1 & S2 heard, Rate controlled Gastrointestinal system: Abdomen is nondistended, soft and nontender. Normal bowel sounds heard. Extremities: No cyanosis, clubbing; lower extremity edema present Central nervous system: Alert and oriented. No focal neurological deficits. Moving extremities Skin: Multiple skin tears on the right lower extremity below the knee. Psychiatry: Judgement and insight appear normal. Mood & affect appropriate.     Data Reviewed: I have personally reviewed following labs and imaging studies  CBC: Recent Labs  Lab 01/31/19 0248  02/02/19 0322 02/03/19 0342 02/04/19 0532 02/04/19 0625 02/05/19 0310  WBC 14.3*   < > 14.9* 12.6* 13.5* 13.3* 11.8*  NEUTROABS 11.8*  --   --   --  9.8*  --  8.3*  HGB 7.8*   < > 7.5* 7.9* 7.8* 8.0* 7.3*  HCT 24.7*   < > 23.3* 25.1* 25.2* 26.0* 23.6*  MCV 96.9   < > 93.2 95.1 96.6 97.0 96.3  PLT 218   < > 201 196 200 202 146*   < > = values in this interval not displayed.   Basic Metabolic Panel: Recent Labs  Lab 01/31/19 0248 02/01/19 0718 02/02/19 0322 02/03/19 0342 02/04/19 0532 02/04/19 0625 02/05/19 0310  NA 137  141 137 134* 133*  --  133*  K 5.9* 5.0 4.1 4.0 3.8  --  3.6  CL 105 107 102 100 100  --  98  CO2 15* 19* 20* 24 21*  --  25  GLUCOSE 209* 105* 116* 126* 98  --  134*  BUN 152* 122* 84* 50* 57*  --  32*  CREATININE 19.14* 16.01* 11.77* 8.04* 8.82* 8.81* 5.96*  CALCIUM 6.7* 6.2* 6.1* 6.4* 6.3*  --  6.3*  PHOS >30.0* 8.9* 6.1* 4.2  --   --   --    GFR: Estimated Creatinine Clearance: 21.9 mL/min (A) (by C-G formula based on SCr of 5.96 mg/dL (H)). Liver Function Tests: Recent Labs  Lab 01/30/19 2042 01/31/19 0248 02/01/19 0718 02/02/19 0322 02/03/19 0342 02/04/19 0532  AST 17 16  --   --   --  26  ALT 10 11  --   --   --  7  ALKPHOS 68 67  --   --   --  74  BILITOT 0.7 0.7  --   --   --  0.3  PROT 6.8 6.3*  --   --   --  5.6*  ALBUMIN 2.0* 1.9* 1.6* 1.6* 1.5* 1.6*   No results for input(s): LIPASE, AMYLASE in the last 168 hours. Recent Labs  Lab 01/30/19 2042  AMMONIA 28   Coagulation Profile: Recent Labs  Lab 01/31/19 0248  INR 1.4*   Cardiac Enzymes: No results for input(s): CKTOTAL, CKMB, CKMBINDEX, TROPONINI in the last 168 hours. BNP (last 3 results) No results for input(s): PROBNP in the last 8760 hours. HbA1C: No results for input(s): HGBA1C in the last 72 hours. CBG: Recent Labs  Lab 02/04/19 0654 02/04/19 1133 02/04/19 1740 02/04/19 2130 02/05/19 0658  GLUCAP 105* 120* 118* 152* 157*   Lipid Profile: No results for input(s): CHOL, HDL, LDLCALC, TRIG, CHOLHDL, LDLDIRECT in the last 72 hours. Thyroid Function Tests: No results for input(s): TSH, T4TOTAL, FREET4, T3FREE, THYROIDAB in the last 72 hours. Anemia Panel: No results for input(s): VITAMINB12, FOLATE, FERRITIN, TIBC, IRON, RETICCTPCT in the last 72 hours. Sepsis Labs: Recent Labs  Lab 01/30/19 2044 01/31/19 0248  LATICACIDVEN 0.6 0.7    Recent Results (from the past 240 hour(s))  SARS Coronavirus 2 (CEPHEID - Performed in [facility] hospital lab), Hosp Order     Status: None    Collection Time: 01/30/19  7:58 PM   Specimen: Nasopharyngeal Swab  Result Value Ref Range Status   SARS Coronavirus 2 NEGATIVE NEGATIVE Final    Comment: (NOTE) If result is NEGATIVE SARS-CoV-2 target nucleic acids are NOT DETECTED. The SARS-CoV-2 RNA is generally detectable in upper and lower  respiratory specimens during the acute phase of infection. The lowest  concentration of SARS-CoV-2 viral copies this assay can detect is 250  copies / mL. A negative result does not preclude SARS-CoV-2 infection  and should not be used as the sole basis for treatment or other  patient management decisions.  A negative result may occur with  improper specimen  collection / handling, submission of specimen other  than nasopharyngeal swab, presence of viral mutation(s) within the  areas targeted by this assay, and inadequate number of viral copies  (<250 copies / mL). A negative result must be combined with clinical  observations, patient history, and epidemiological information. If result is POSITIVE SARS-CoV-2 target nucleic acids are DETECTED. The SARS-CoV-2 RNA is generally detectable in upper and lower  respiratory specimens dur ing the acute phase of infection.  Positive  results are indicative of active infection with SARS-CoV-2.  Clinical  correlation with patient history and other diagnostic information is  necessary to determine patient infection status.  Positive results do  not rule out bacterial infection or co-infection with other viruses. If result is PRESUMPTIVE POSTIVE SARS-CoV-2 nucleic acids MAY BE PRESENT.   A presumptive positive result was obtained on the submitted specimen  and confirmed on repeat testing.  While 2019 novel coronavirus  (SARS-CoV-2) nucleic acids may be present in the submitted sample  additional confirmatory testing may be necessary for epidemiological  and / or clinical management purposes  to differentiate between  SARS-CoV-2 and other Sarbecovirus  currently known to infect humans.  If clinically indicated additional testing with an alternate test  methodology [phone]) is advised. The SARS-CoV-2 RNA is generally  detectable in upper and lower respiratory sp ecimens during the acute  phase of infection. The expected result is Negative. Fact Sheet for Patients:  StrictlyIdeas.no Fact Sheet for Healthcare Providers: BankingDealers.co.za This test is not yet approved or cleared by the [place] FDA and has been authorized for detection and/or diagnosis of SARS-CoV-2 by FDA under an Emergency Use Authorization (EUA).  This EUA will remain in effect (meaning this test can be used) for the duration of the COVID-19 declaration under Section 564(b)(1) of the Act, 21 U.S.C. section 360bbb-3(b)(1), unless the authorization is terminated or revoked sooner. Performed at [facility], [place] [address] [place], [place] [address]   MRSA PCR Screening     Status: None   Collection Time: 01/31/19  3:24 AM   Specimen: Nasopharyngeal  Result Value Ref Range Status   MRSA by PCR NEGATIVE NEGATIVE Final    Comment:        The GeneXpert MRSA Assay (FDA approved for NASAL specimens only), is one component of a comprehensive MRSA colonization surveillance program. It is not intended to diagnose MRSA infection nor to guide or monitor treatment for MRSA infections. Performed at [facility], [place] [address] [place], [place] [address]          Radiology Studies: Dg Chest Port 1 View  Result Date: 02/04/2019 CLINICAL DATA:  Initial evaluation for acute shortness of breath. EXAM: PORTABLE CHEST 1 VIEW COMPARISON:  Prior radiograph from 02/03/2019. FINDINGS: Right-sided hemodialysis catheter in place with tip overlying the cavoatrial junction. Cardiomegaly, stable. Mediastinal silhouette within normal limits. Lungs normally inflated. Perihilar vascular congestion without frank  pulmonary edema. Small left pleural effusion. Hazy and patchy bibasilar opacities could reflect atelectasis, infiltrate, or edema. No pneumothorax. No acute osseous finding. IMPRESSION: 1. Cardiomegaly with mild perihilar vascular congestion without frank pulmonary edema. 2. Associated small left pleural effusion. 3. Superimposed hazy and patchy bibasilar opacities, favored to reflect atelectasis and/or edema. Superimposed infiltrates could be considered in the correct clinical setting. Electronically Signed   By: Jeannine Boga M.D.   On: 02/04/2019 05:52        Scheduled Meds: . amLODipine  10 mg Oral Daily  . atorvastatin  40 mg Oral Daily  . calcitRIOL  0.25 mcg Oral Daily  . Chlorhexidine Gluconate Cloth  6 each Topical Q0600  . citalopram  40 mg Oral Daily  . gabapentin  300 mg Oral QHS  . heparin  5,000 Units Subcutaneous Q8H  . Melatonin  4.5 mg Oral QHS  . pantoprazole  40 mg Oral Daily   Continuous Infusions:   LOS: 0 days        Aline August, MD [facility] 02/05/2019, 10:24 AM

## 2019-02-05 NOTE — Evaluation (Signed)
Occupational Therapy Evaluation Patient Details Name: Michael Riggs MRN: [id number] DOB: 1971-07-08 Today's Date: 02/05/2019    History of Present Illness Pt is a 48 y/o male admitted secondary to increased SOB weakness. Found to be in volume overload and in need of HD. PMH includes CKD, DM, HTN, L AV fistula, and s/p L BKA.    Clinical Impression   Pt PTA: pt reports independence with ADL and mobility with SPC in bathroom, mostly w/c use depending on prosthetic fitting. Pt currently performing minimal bed mobility and allowed OTR to apply scrotal sling for severely swollen scrotum. Pt reporting pain 9/10 with movement of LB and it finally settled to 7/10- RN in room for tylenol. Pt unable to sit to EOB for fitting and pt is without prosthetic for LLE for sit to stand and severe pain with any movement.Set-upA for UB ADL and maxA to Kevin for LB ADL due to pain. OT hoping that sling will assist with reducing swelling in hopes to increase mobility in the coming therapy sessions. Pt tolerating session well and stating that he is very motivated to return to PLOF. Pt would greatly benefit from continued OT skilled services for ADL, mobility and HEP progression. OT following acutely.  24/7 scrotal sling to be worn and taken off only for hygiene- wash with soap and water. (directions on whiteboard)      Follow Up Recommendations  CIR    Equipment Recommendations  None recommended by OT    Recommendations for Other Services       Precautions / Restrictions Precautions Precautions: Fall;Other (comment) Precaution Comments: L BKA; prosthetic is at home  Restrictions Weight Bearing Restrictions: No      Mobility Bed Mobility Overal bed mobility: Needs Assistance             General bed mobility comments: Pt attempting to lift bottom up to allow for scrotum to be lifted for sling. deferred for scrotal sling placement  Transfers                 General transfer  comment: pt in too severe of pain    Balance                                           ADL either performed or assessed with clinical judgement   ADL Overall ADL's : Needs assistance/impaired Eating/Feeding: Set up;Bed level   Grooming: Set up;Bed level   Upper Body Bathing: Set up;Bed level   Lower Body Bathing: Maximal assistance;Bed level Lower Body Bathing Details (indicate cue type and reason): unable to tolerate movement of LB for scrotal sling Upper Body Dressing : Set up;Sitting   Lower Body Dressing: Maximal assistance;Bed level Lower Body Dressing Details (indicate cue type and reason): unable to tolerate movement of LB for scrotal sling Toilet Transfer: Total assistance   Toileting- Clothing Manipulation and Hygiene: Maximal assistance;+2 for physical assistance;+2 for safety/equipment;Bed level Toileting - Clothing Manipulation Details (indicate cue type and reason): pt reports using bed pain - severe pain     Functional mobility during ADLs: Total assistance General ADL Comments: set-upA for UB ADL and severe pain- pt unable to tolerate movement of LB for scrotal sling     Vision Baseline Vision/History: Wears glasses Wears Glasses: At all times Vision Assessment?: No apparent visual deficits     Perception     Praxis  Pertinent Vitals/Pain Pain Assessment: 0-10 Pain Score: 9  Pain Location: BLE, scrotal area with movement  Pain Descriptors / Indicators: Grimacing;Guarding;Sharp Pain Intervention(s): Monitored during session;RN gave pain meds during session;Limited activity within patient's tolerance     Hand Dominance Right   Extremity/Trunk Assessment Upper Extremity Assessment Upper Extremity Assessment: Generalized weakness   Lower Extremity Assessment Lower Extremity Assessment: Defer to PT evaluation RLE Deficits / Details: swelling LLE Deficits / Details: increased swelling       Communication  Communication Communication: No difficulties   Cognition Arousal/Alertness: Awake/alert Behavior During Therapy: WFL for tasks assessed/performed Overall Cognitive Status: Within Functional Limits for tasks assessed                                 General Comments: Pt fatigued earlier in the day, but more alert at this time- following all commands.   General Comments  Pt reports "I really want to be able to get up and finally pee standing up when I am not in so much pain."    Exercises     Shoulder Instructions      Home Living Family/patient expects to be discharged to:: Private residence Living Arrangements: Parent Available Help at Discharge: Family Type of Home: House Home Access: Stairs to enter Entrance Stairs-Number of Steps: 1   Home Layout: Two level;Able to live on main level with bedroom/bathroom               Home Equipment: Wheelchair - manual   Additional Comments: Has prosthetic but reports it is at home. Reports he has not fit in prosthetic lately secondary to swelling.       Prior Functioning/Environment Level of Independence: Independent with assistive device(s)        Comments: Pt reports he used WC for primary mobility since swelling started, and was able to transfer independently. Prior to onset of swelling, pt able to ambulate independently with use of prosthetic.         OT Problem List: Decreased strength;Decreased activity tolerance;Pain;Increased edema      OT Treatment/Interventions: Self-care/ADL training;Therapeutic exercise;Neuromuscular education;Energy conservation;DME and/or AE instruction;Patient/family education;Balance training;Therapeutic activities;Modalities;Splinting;Other (comment)(scrotal sling maintenance)    OT Goals(Current goals can be found in the care plan section) Acute Rehab OT Goals Patient Stated Goal: "to be able to walk to the bathroom and be able to take care of myself" OT Goal Formulation:  With patient Time For Goal Achievement: 02/19/19 Potential to Achieve Goals: Good ADL Goals Pt Will Perform Grooming: with modified independence;sitting Pt Will Perform Upper Body Dressing: with modified independence;sitting Pt Will Perform Lower Body Dressing: with modified independence;sitting/lateral leans;sit to/from stand Pt Will Transfer to Toilet: with min assist;squat pivot transfer;bedside commode Pt Will Perform Toileting - Clothing Manipulation and hygiene: with min guard assist;sit to/from stand Additional ADL Goal #1: Pt will tolerate scrotal sling 24/7 besides hygiene with ability to direct donning/doffing instructions to staff.  OT Frequency: Min 3X/week   Barriers to D/C: Decreased caregiver support          Co-evaluation              AM-PAC OT "6 Clicks" Daily Activity     Outcome Measure Help from another person eating meals?: None Help from another person taking care of personal grooming?: None Help from another person toileting, which includes using toliet, bedpan, or urinal?: Total Help from another person bathing (including washing, rinsing, drying)?: A Lot  Help from another person to put on and taking off regular upper body clothing?: A Little Help from another person to put on and taking off regular lower body clothing?: Total 6 Click Score: 15   End of Session Nurse Communication: Other (comment)(scrotal sling, wearing schedule on board with directions.)  Activity Tolerance: Patient limited by pain Patient left: in bed;with call bell/phone within reach  OT Visit Diagnosis: Pain;Muscle weakness (generalized) (M62.81) Pain - part of body: (scrotum)                Time: 8375-4237 OT Time Calculation (min): 24 min Charges:  OT General Charges $OT Visit: 1 Visit OT Evaluation $OT Eval Moderate Complexity: 1 Mod OT Treatments $Therapeutic Activity: 8-22 mins  Ebony Hail Harold Hedge) Marsa Aris OTR/L Acute Rehabilitation Services Pager:  [phone] Office: [place] 02/05/2019, 4:18 PM

## 2019-02-05 NOTE — Progress Notes (Signed)
Blount,NP, texted Ca 6.3 result. No change from yesterday.

## 2019-02-05 NOTE — Progress Notes (Addendum)
[facility]    NEPHROLOGY PROGRESS NOTE  SUBJECTIVE: Patient seen and examined. He reports having a cough but no shortness of breath.  He denies any fevers or chills, chest pain, nausea, or other associated symptoms.  The remained of his ROS was negative.  OBJECTIVE:  Vitals:   02/05/19 0333 02/05/19 0853  BP: 112/67 115/75  Pulse: 78 75  Resp: 16 16  Temp: 98.7 F (37.1 C) (!) 97.5 F (36.4 C)  SpO2: 98% 98%    Intake/Output Summary (Last 24 hours) at 02/05/2019 1603 Last data filed at 02/05/2019 0900 Gross per 24 hour  Intake 680 ml  Output 4000 ml  Net -3320 ml      General:  AAOx3 NAD HEENT: MMM [place] AT anicteric sclera Neck:  No JVD, no adenopathy CV:  Heart RRR  Lungs:  L/S CTA bilaterally Abd:  abd SNT/ND with normal BS GU:  Bladder non-palpable, positive scrotal edema Extremities: +1 bilateral lower extremity edema, multiple skin tears noted to bilateral lower extremities.  Brawny skin changes noted to bilateral lower extremities. Skin:  No skin rash  MEDICATIONS:  . amLODipine  10 mg Oral Daily  . atorvastatin  40 mg Oral Daily  . calcitRIOL  0.25 mcg Oral Daily  . Chlorhexidine Gluconate Cloth  6 each Topical Q0600  . citalopram  40 mg Oral Daily  . gabapentin  300 mg Oral QHS  . heparin  5,000 Units Subcutaneous Q8H  . Melatonin  4.5 mg Oral QHS  . pantoprazole  40 mg Oral Daily       LABS:   CBC Latest Ref Rng & Units 02/05/2019 02/04/2019 02/04/2019  WBC 4.0 - 10.5 K/uL 11.8(H) 13.3(H) 13.5(H)  Hemoglobin 13.0 - 17.0 g/dL 7.3(L) 8.0(L) 7.8(L)  Hematocrit 39.0 - 52.0 % 23.6(L) 26.0(L) 25.2(L)  Platelets 150 - 400 K/uL 146(L) 202 200    CMP Latest Ref Rng & Units 02/05/2019 02/04/2019 02/04/2019  Glucose 70 - 99 mg/dL 134(H) - 98  BUN 6 - 20 mg/dL 32(H) - 57(H)  Creatinine 0.61 - 1.24 mg/dL 5.96(H) 8.81(H) 8.82(H)  Sodium 135 - 145 mmol/L 133(L) - 133(L)  Potassium 3.5 - 5.1 mmol/L 3.6 - 3.8  Chloride 98 - 111 mmol/L 98 - 100  CO2  22 - 32 mmol/L 25 - 21(L)  Calcium 8.9 - 10.3 mg/dL 6.3(LL) - 6.3(LL)  Total Protein 6.5 - 8.1 g/dL - - 5.6(L)  Total Bilirubin 0.3 - 1.2 mg/dL - - 0.3  Alkaline Phos 38 - 126 U/L - - 74  AST 15 - 41 U/L - - 26  ALT 0 - 44 U/L - - 7    Lab Results  Component Value Date   PTH 144 (H) 02/03/2019   CALCIUM 6.3 (LL) 02/05/2019   CAION 1.01 (L) 01/30/2019   PHOS 4.2 02/03/2019       Component Value Date/Time   COLORURINE YELLOW 01/31/2019 2315   APPEARANCEUR CLOUDY (A) 01/31/2019 2315   LABSPEC 1.012 01/31/2019 2315   PHURINE 5.0 01/31/2019 2315   GLUCOSEU 50 (A) 01/31/2019 2315   HGBUR SMALL (A) 01/31/2019 2315   BILIRUBINUR NEGATIVE 01/31/2019 2315   KETONESUR NEGATIVE 01/31/2019 2315   PROTEINUR >=300 (A) 01/31/2019 2315   NITRITE NEGATIVE 01/31/2019 2315   LEUKOCYTESUR NEGATIVE 01/31/2019 2315      Component Value Date/Time   PHART 7.221 (L) 01/31/2019 1115   PCO2ART 34.9 01/31/2019 1115   PO2ART 80.0 (L) 01/31/2019 1115   HCO3 13.8 (L) 01/31/2019 1115  TCO2 12 (L) 01/30/2019 2339   ACIDBASEDEF 12.4 (H) 01/31/2019 1115   O2SAT 93.8 01/31/2019 1115       Component Value Date/Time   IRON 65 01/31/2019 0248   TIBC 119 (L) 01/31/2019 0248   FERRITIN 284 01/31/2019 0248   IRONPCTSAT 55 (H) 01/31/2019 0248       ASSESSMENT/PLAN:    1.  End-stage renal disease on hemodialysis.  Plan for Tuesday, Thursday, Saturday dialysis.  Has dialysis planned in [place].  Is dialyzing via a tunneled catheter while AV fistula matures.  Will attempt to ultrafilter more on hemodialysis.  Will plan additional treatment tomorrow (is a TRS patient).  2.  Scrotal edema.  Hopefully will improve with ultrafiltration.  Would plan to elevate while in bed.  3.  Ambulatory dysfunction.  Recommend PT evaluation.  May need transfer to rehab.  4.  Hypocalcemia.  Continue calcitriol and calcium carbonate.  PTH on 02/03/2019 was 144.  5.  Anemia of CKD.  Will add IV iron and Aranesp on  HD.    [place], DO, [place]

## 2019-02-06 ENCOUNTER — Encounter (HOSPITAL_COMMUNITY): Payer: Self-pay

## 2019-02-06 LAB — CBC WITH DIFFERENTIAL/PLATELET
Abs Immature Granulocytes: 0.34 10*3/uL — ABNORMAL HIGH (ref 0.00–0.07)
Basophils Absolute: 0.1 10*3/uL (ref 0.0–0.1)
Basophils Relative: 0 %
Eosinophils Absolute: 0.5 10*3/uL (ref 0.0–0.5)
Eosinophils Relative: 3 %
HCT: 24.4 % — ABNORMAL LOW (ref 39.0–52.0)
Hemoglobin: 7.5 g/dL — ABNORMAL LOW (ref 13.0–17.0)
Immature Granulocytes: 2 %
Lymphocytes Relative: 7 %
Lymphs Abs: 1 10*3/uL (ref 0.7–4.0)
MCH: 30.2 pg (ref 26.0–34.0)
MCHC: 30.7 g/dL (ref 30.0–36.0)
MCV: 98.4 fL (ref 80.0–100.0)
Monocytes Absolute: 1.4 10*3/uL — ABNORMAL HIGH (ref 0.1–1.0)
Monocytes Relative: 9 %
Neutro Abs: 11.9 10*3/uL — ABNORMAL HIGH (ref 1.7–7.7)
Neutrophils Relative %: 79 %
Platelets: 150 10*3/uL (ref 150–400)
RBC: 2.48 MIL/uL — ABNORMAL LOW (ref 4.22–5.81)
RDW: 12.6 % (ref 11.5–15.5)
WBC: 15.2 10*3/uL — ABNORMAL HIGH (ref 4.0–10.5)
nRBC: 0 % (ref 0.0–0.2)

## 2019-02-06 LAB — GLUCOSE, CAPILLARY
Glucose-Capillary: 77 mg/dL (ref 70–99)
Glucose-Capillary: 145 mg/dL — ABNORMAL HIGH (ref 70–99)
Glucose-Capillary: 167 mg/dL — ABNORMAL HIGH (ref 70–99)

## 2019-02-06 LAB — RENAL FUNCTION PANEL
Albumin: 1.5 g/dL — ABNORMAL LOW (ref 3.5–5.0)
Anion gap: 10 (ref 5–15)
BUN: 40 mg/dL — ABNORMAL HIGH (ref 6–20)
CO2: 25 mmol/L (ref 22–32)
Calcium: 6.5 mg/dL — ABNORMAL LOW (ref 8.9–10.3)
Chloride: 96 mmol/L — ABNORMAL LOW (ref 98–111)
Creatinine, Ser: 7.31 mg/dL — ABNORMAL HIGH (ref 0.61–1.24)
GFR calc Af Amer: 9 mL/min — ABNORMAL LOW (ref >60)
GFR calc non Af Amer: 8 mL/min — ABNORMAL LOW (ref >60)
Glucose, Bld: 106 mg/dL — ABNORMAL HIGH (ref 70–99)
Phosphorus: 3.7 mg/dL (ref 2.5–4.6)
Potassium: 3.9 mmol/L (ref 3.5–5.1)
Sodium: 131 mmol/L — ABNORMAL LOW (ref 135–145)

## 2019-02-06 LAB — SARS CORONAVIRUS 2 BY RT PCR (HOSPITAL ORDER, PERFORMED IN ~~LOC~~ HOSPITAL LAB): SARS Coronavirus 2: NEGATIVE

## 2019-02-06 MED ORDER — ALTEPLASE 2 MG IJ SOLR: 2.0000 mg | Freq: Once | INTRAMUSCULAR | Status: DC | PRN

## 2019-02-06 MED ORDER — ACETAMINOPHEN 325 MG PO TABS
ORAL_TABLET | ORAL | Status: AC
  Filled 2019-02-06: qty 2

## 2019-02-06 MED ORDER — TRAMADOL HCL 50 MG PO TABS
50.0000 mg | ORAL_TABLET | Freq: Four times a day (QID) | ORAL | Status: DC | PRN
  Administered 2019-02-06 – 2019-02-08 (×4): 50 mg via ORAL
  Filled 2019-02-06 (×5): qty 1

## 2019-02-06 MED ORDER — CALCITRIOL 0.25 MCG PO CAPS
ORAL_CAPSULE | ORAL | Status: AC
  Administered 2019-02-06: 10:00:00
  Filled 2019-02-06: qty 1

## 2019-02-06 MED ORDER — SODIUM CHLORIDE 0.9 % IV SOLN: 100.0000 mL | INTRAVENOUS | Status: DC | PRN

## 2019-02-06 MED ORDER — CALCIUM GLUCONATE-NACL 2-0.675 GM/100ML-% IV SOLN
2.0000 g | INTRAVENOUS | Status: AC
  Administered 2019-02-06: 2000 mg via INTRAVENOUS
  Filled 2019-02-06: qty 100

## 2019-02-06 MED ORDER — COLLAGENASE 250 UNIT/GM EX OINT
TOPICAL_OINTMENT | Freq: Every day | CUTANEOUS | Status: DC
  Administered 2019-02-06 – 2019-02-08 (×3): via TOPICAL
  Filled 2019-02-06: qty 30

## 2019-02-06 MED ORDER — HEPARIN SODIUM (PORCINE) 1000 UNIT/ML IJ SOLN
INTRAMUSCULAR | Status: AC
  Administered 2019-02-06: 3800 [IU] via INTRAVENOUS_CENTRAL
  Filled 2019-02-06: qty 4

## 2019-02-06 MED ORDER — HEPARIN SODIUM (PORCINE) 1000 UNIT/ML DIALYSIS
1000.0000 [IU] | INTRAMUSCULAR | Status: DC | PRN
  Administered 2019-02-06 – 2019-02-07 (×2): 3800 [IU] via INTRAVENOUS_CENTRAL

## 2019-02-06 NOTE — Consult Note (Signed)
   [facility] CM Inpatient Consult   02/06/2019  Michael Riggs 1971-01-20 [id number]    Patient reviewed for 7 day and 30 day readmission with 24% high risk score for unplanned readmission; has 2 hospitalizations and 1 ED visit in the past 6 months; and to check for potential [facility] care management service needs as benefit from his [employer].   Chart review and MD history and physical dated 02/04/19 show as follows: [name] is a 48 y.o. male with history of chronic kidney disease was recently admitted a week ago for acute encephalopathy in the setting of uremia was started on dialysis at the time, patient also was found to be hyperglycemic eventually discharged home with family 7/17 and on 7/18, was found to have increasing shortness of breath, difficult to ambulate and feeling weak, presented back to the ER at [facility]. He was transferred to [facility] for the need for requiring dialysis.  He was found to have mild vascular congestion on chest x-ray.  Nephrology was consulted.   (End-stage renal disease recently started on dialysis Anasarca along with volume overload, hypocalcemia)  Primary care provider is Dr. [name] with [facility], listed to provide transition of care.    Chart reveals PT and OT note recommending patient for CIR [facility]).   Will followforprogress and disposition, and ifthere are any changesin dispositionand needs for appropriate community follow-up,please referto [facility] care management.  Of note, [facility] Care Management services does not replace or interfere with any servicesarranged by transition of care CM or social work.    For questions and additional information, please contact:  Toluwani Ruder A. Kaiesha Tonner, BSN, RN-BC [facility] Liaison Cell: [phone]

## 2019-02-06 NOTE — Consult Note (Addendum)
[facility] Nurse wound consult note Reason for Consult:Falls at home/debility/resulting in trauma wounds to right lower leg circumferentially.  Wound type:trauma Scrotal edema Pressure Injury POA: NA Measurement: Right posterior lower leg:  2 cm x 2 cm wound with eschar to wound bed Right lateral and anterior lower leg, scattered 1 cm x 1 cm  Wounds with eschar  Wound SMO:[id number] tissue Drainage (amount, consistency, odor) minimal serosanguinous  No odor.  Periwound:Dark dry skin, edema,  Dressing procedure/placement/frequency: Elevate scrotum for edema Cleanse right leg with soap and water and pat dry.  Moisturize leg with barrier creams. Clean  wounds to right lower leg with NS and pat dry.  Apply Santyl to open wounds.  Cover with NS moist 2x2.  Wrap legs with kerlix and tape.  Change daily.  Will not follow at this time.  Please re-consult if needed.  Domenic Moras MSN, RN, FNP-BC CWON Wound, Ostomy, Continence Nurse Pager [phone]

## 2019-02-06 NOTE — Progress Notes (Signed)
Occupational Therapy Treatment Patient Details Name: Michael Riggs MRN: [id number] DOB: [birth year] Today's Date: 02/06/2019    History of present illness Pt is a 48 y/o male admitted secondary to increased SOB weakness. Found to be in volume overload and in need of HD. PMH includes CKD, DM, HTN, L AV fistula, and s/p L BKA.    OT comments  Pt seen for scrotal sling management. Pt mod A for bed mobility and Testicles put in scrotal sling with max A (+2 helpful) also placed towel under scrotum to elevate further. Pt educated on importance of elevation, slight compression, and wearing with sling schedule. RN aware and in room assisting. Pt also set up for grooming tasks at bed level. OT will continue to follow and CIR remains essential to maximize safety and independence in ADL and functional transfers.    Follow Up Recommendations  CIR    Equipment Recommendations  Other (comment)(defer to next venue of care)    Recommendations for Other Services      Precautions / Restrictions Precautions Precautions: Fall;Other (comment) Precaution Comments: L BKA; prosthetic is at home        Mobility Bed Mobility Overal bed mobility: Needs Assistance Bed Mobility: Rolling Rolling: Mod assist         General bed mobility comments: cues for sequencing  Transfers                 General transfer comment: NT this session    Balance                                           ADL either performed or assessed with clinical judgement   ADL Overall ADL's : Needs assistance/impaired     Grooming: Set up;Bed level;Wash/dry hands;Wash/dry face               Lower Body Dressing: Maximal assistance;Bed level Lower Body Dressing Details (indicate cue type and reason): max A to don Scrotal sling in supine                     Vision       Perception     Praxis      Cognition Arousal/Alertness: Awake/alert Behavior During Therapy: WFL for  tasks assessed/performed Overall Cognitive Status: Within Functional Limits for tasks assessed                                          Exercises     Shoulder Instructions       General Comments      Pertinent Vitals/ Pain       Pain Assessment: Faces Faces Pain Scale: Hurts whole lot Pain Location: BLE, scrotal area with movement  Pain Descriptors / Indicators: Grimacing;Guarding;Sharp Pain Intervention(s): Limited activity within patient's tolerance;Repositioned;Other (comment)(elevation and application of scrotal sling)  Home Living                                          Prior Functioning/Environment              Frequency  Min 3X/week        Progress Toward Goals  OT Goals(current goals  can now be found in the care plan section)  Progress towards OT goals: Progressing toward goals  Acute Rehab OT Goals Patient Stated Goal: "to be able to walk to the bathroom and be able to take care of myself" OT Goal Formulation: With patient Time For Goal Achievement: 02/19/19 Potential to Achieve Goals: Good  Plan Discharge plan remains appropriate;Frequency remains appropriate    Co-evaluation                 AM-PAC OT "6 Clicks" Daily Activity     Outcome Measure   Help from another person eating meals?: None Help from another person taking care of personal grooming?: A Little Help from another person toileting, which includes using toliet, bedpan, or urinal?: Total Help from another person bathing (including washing, rinsing, drying)?: A Lot Help from another person to put on and taking off regular upper body clothing?: A Lot Help from another person to put on and taking off regular lower body clothing?: Total 6 Click Score: 13    End of Session    OT Visit Diagnosis: Pain;Muscle weakness (generalized) (M62.81) Pain - Right/Left: Right Pain - part of body: Leg(scrotum)   Activity Tolerance Patient limited by  pain   Patient Left in bed;with call bell/phone within reach   Nurse Communication Other (comment)(scrotal sling, wearing schedule on board with directions.)        Time: 1552-0802 OT Time Calculation (min): 12 min  Charges: OT General Charges $OT Visit: 1 Visit OT Treatments $Self Care/Home Management : 8-22 mins  [name] OTR/L Acute Rehabilitation Services Pager: [phone] Office: [place] 02/06/2019, 5:18 PM

## 2019-02-06 NOTE — Progress Notes (Signed)
[name], male   DOB: 12-28-1970, 48 y.o.   MRN: [id number]  PROGRESS NOTE    Michael Riggs  OHY:[id number] DOB: 21-Feb-1971 DOA: 02/04/2019 PCP: [name], MD   Brief Narrative:  48 year old male with history of chronic renal disease who was recently admitted for acute metabolic encephalopathy in the setting of uremia, was started on dialysis and discharged home on 02/03/2019 presented on 02/03/2021 [facility] for shortness of breath and weakness and was transferred to [facility] for the need for requiring dialysis.  He was found to have mild vascular congestion on chest x-ray.  Initial COVID-19 testing was negative at [place].  Nephrology was consulted.  PT recommended CIR patient.  Assessment & Plan:   End-stage renal disease recently started on dialysis Anasarca along with volume overload -Nephrology following.  Dialysis as per nephrology schedule.  Hypocalcemia -Replace.  Repeat a.m. labs  Generalized deconditioning with ambulatory dysfunction -PT recommends CIR.  CIR consulted.  Hypertension -Continue amlodipine.  Blood pressure stable  Hyperlipidemia -Continue statin  Diabetes mellitus type 2 -Recent hemoglobin was 5.  Blood sugars stable.  Continue CBGs with SSI  Leukocytosis-probably reactive.  Slightly worse today.  Monitor  Multiple skin tears and ulceration of the right lower extremity -Consult wound care team.  Anemia of chronic disease-hemoglobin stable.  Monitor.  Transfuse if hemoglobin is less than 7.  History of syphilis -Treated as an outpatient with penicillin at health department recently.  Outpatient follow-up.  Morbid obesity  -Outpatient follow-up  DVT prophylaxis: Heparin Code Status: Full Family Communication: Spoke to patient at bedside Disposition Plan: Probable CIR once bed is available.  Consultants: Nephrology  Procedures: None  Antimicrobials: None   Subjective: Patient seen and  examined at bedside undergoing dialysis.  Complains of intermittent cough.  No worsening shortness of breath, fever or vomiting.  Objective: Vitals:   02/06/19 0730 02/06/19 0800 02/06/19 0830 02/06/19 0900  BP: 102/73 129/76 129/79 127/89  Pulse: 77 79 78 79  Resp: 14 16 15 16   Temp:      TempSrc:      SpO2:      Weight:      Height:        Intake/Output Summary (Last 24 hours) at 02/06/2019 1025 Last data filed at 02/06/2019 0600 Gross per 24 hour  Intake 660 ml  Output 0 ml  Net 660 ml   Filed Weights   02/04/19 1257 02/04/19 1712 02/05/19 0333  Weight: (!) 144.1 kg (!) 140.9 kg (!) 141.1 kg    Examination:  General exam: No acute distress.  Looks older than stated age.   Respiratory system: Bilateral decreased breath sounds at bases with basilar crackles.  No wheezing Cardiovascular system: Rate controlled, S1-S2 heard Gastrointestinal system: Abdomen is nondistended, soft and nontender. Normal bowel sounds heard. Extremities: No cyanosis; lower extremity edema present Skin: Multiple skin tears on the right lower extremity below the knee.   Data Reviewed: I have personally reviewed following labs and imaging studies  CBC: Recent Labs  Lab 01/31/19 0248  02/03/19 0342 02/04/19 0532 02/04/19 0625 02/05/19 0310 02/06/19 0636  WBC 14.3*   < > 12.6* 13.5* 13.3* 11.8* 15.2*  NEUTROABS 11.8*  --   --  9.8*  --  8.3* 11.9*  HGB 7.8*   < > 7.9* 7.8* 8.0* 7.3* 7.5*  HCT 24.7*   < > 25.1* 25.2* 26.0* 23.6* 24.4*  MCV 96.9   < > 95.1 96.6 97.0 96.3 98.4  PLT 218   < > 196 200 202 146* 150   < > = values in this interval not displayed.   Basic Metabolic Panel: Recent Labs  Lab 01/31/19 0248 02/01/19 0718 02/02/19 0322 02/03/19 0342 02/04/19 0532 02/04/19 0625 02/05/19 0310 02/06/19 0742  NA 137 141 137 134* 133*  --  133* 131*  K 5.9* 5.0 4.1 4.0 3.8  --  3.6 3.9  CL 105 107 102 100 100  --  98 96*  CO2 15* 19* 20* 24 21*  --  25 25  GLUCOSE 209* 105* 116*  126* 98  --  134* 106*  BUN 152* 122* 84* 50* 57*  --  32* 40*  CREATININE 19.14* 16.01* 11.77* 8.04* 8.82* 8.81* 5.96* 7.31*  CALCIUM 6.7* 6.2* 6.1* 6.4* 6.3*  --  6.3* 6.5*  PHOS >30.0* 8.9* 6.1* 4.2  --   --   --  3.7   GFR: Estimated Creatinine Clearance: 17.9 mL/min (A) (by C-G formula based on SCr of 7.31 mg/dL (H)). Liver Function Tests: Recent Labs  Lab 01/30/19 2042 01/31/19 0248 02/01/19 0718 02/02/19 0322 02/03/19 0342 02/04/19 0532 02/06/19 0742  AST 17 16  --   --   --  26  --   ALT 10 11  --   --   --  7  --   ALKPHOS 68 67  --   --   --  74  --   BILITOT 0.7 0.7  --   --   --  0.3  --   PROT 6.8 6.3*  --   --   --  5.6*  --   ALBUMIN 2.0* 1.9* 1.6* 1.6* 1.5* 1.6* 1.5*   No results for input(s): LIPASE, AMYLASE in the last 168 hours. Recent Labs  Lab 01/30/19 2042  AMMONIA 28   Coagulation Profile: Recent Labs  Lab 01/31/19 0248  INR 1.4*   Cardiac Enzymes: No results for input(s): CKTOTAL, CKMB, CKMBINDEX, TROPONINI in the last 168 hours. BNP (last 3 results) No results for input(s): PROBNP in the last 8760 hours. HbA1C: No results for input(s): HGBA1C in the last 72 hours. CBG: Recent Labs  Lab 02/04/19 2130 02/05/19 0658 02/05/19 1116 02/05/19 1606 02/05/19 2118  GLUCAP 152* 157* 126* 109* 135*   Lipid Profile: No results for input(s): CHOL, HDL, LDLCALC, TRIG, CHOLHDL, LDLDIRECT in the last 72 hours. Thyroid Function Tests: No results for input(s): TSH, T4TOTAL, FREET4, T3FREE, THYROIDAB in the last 72 hours. Anemia Panel: No results for input(s): VITAMINB12, FOLATE, FERRITIN, TIBC, IRON, RETICCTPCT in the last 72 hours. Sepsis Labs: Recent Labs  Lab 01/30/19 2044 01/31/19 0248  LATICACIDVEN 0.6 0.7    Recent Results (from the past 240 hour(s))  SARS Coronavirus 2 (CEPHEID - Performed in [facility] hospital lab), Hosp Order     Status: None   Collection Time: 01/30/19  7:58 PM   Specimen: Nasopharyngeal Swab  Result Value Ref  Range Status   SARS Coronavirus 2 NEGATIVE NEGATIVE Final    Comment: (NOTE) If result is NEGATIVE SARS-CoV-2 target nucleic acids are NOT DETECTED. The SARS-CoV-2 RNA is generally detectable in upper and lower  respiratory specimens during the acute phase of infection. The lowest  concentration of SARS-CoV-2 viral copies this assay can detect is 250  copies / mL. A negative result does not preclude SARS-CoV-2 infection  and should not be used as the sole basis for treatment or other  patient management decisions.  A negative result may  occur with  improper specimen collection / handling, submission of specimen other  than nasopharyngeal swab, presence of viral mutation(s) within the  areas targeted by this assay, and inadequate number of viral copies  (<250 copies / mL). A negative result must be combined with clinical  observations, patient history, and epidemiological information. If result is POSITIVE SARS-CoV-2 target nucleic acids are DETECTED. The SARS-CoV-2 RNA is generally detectable in upper and lower  respiratory specimens dur ing the acute phase of infection.  Positive  results are indicative of active infection with SARS-CoV-2.  Clinical  correlation with patient history and other diagnostic information is  necessary to determine patient infection status.  Positive results do  not rule out bacterial infection or co-infection with other viruses. If result is PRESUMPTIVE POSTIVE SARS-CoV-2 nucleic acids MAY BE PRESENT.   A presumptive positive result was obtained on the submitted specimen  and confirmed on repeat testing.  While 2019 novel coronavirus  (SARS-CoV-2) nucleic acids may be present in the submitted sample  additional confirmatory testing may be necessary for epidemiological  and / or clinical management purposes  to differentiate between  SARS-CoV-2 and other Sarbecovirus currently known to infect humans.  If clinically indicated additional testing with an  alternate test  methodology [phone]) is advised. The SARS-CoV-2 RNA is generally  detectable in upper and lower respiratory sp ecimens during the acute  phase of infection. The expected result is Negative. Fact Sheet for Patients:  StrictlyIdeas.no Fact Sheet for Healthcare Providers: BankingDealers.co.za This test is not yet approved or cleared by the [place] FDA and has been authorized for detection and/or diagnosis of SARS-CoV-2 by FDA under an Emergency Use Authorization (EUA).  This EUA will remain in effect (meaning this test can be used) for the duration of the COVID-19 declaration under Section 564(b)(1) of the Act, 21 U.S.C. section 360bbb-3(b)(1), unless the authorization is terminated or revoked sooner. Performed at [facility], [place] [address] [place], [place] [address]   MRSA PCR Screening     Status: None   Collection Time: 01/31/19  3:24 AM   Specimen: Nasopharyngeal  Result Value Ref Range Status   MRSA by PCR NEGATIVE NEGATIVE Final    Comment:        The GeneXpert MRSA Assay (FDA approved for NASAL specimens only), is one component of a comprehensive MRSA colonization surveillance program. It is not intended to diagnose MRSA infection nor to guide or monitor treatment for MRSA infections. Performed at [facility], [place] [address], [place], [place] [address]          Radiology Studies: No results found.      Scheduled Meds: . acetaminophen      . amLODipine  10 mg Oral Daily  . atorvastatin  40 mg Oral Daily  . calcitRIOL      . calcitRIOL  0.25 mcg Oral Daily  . Chlorhexidine Gluconate Cloth  6 each Topical Q0600  . citalopram  40 mg Oral Daily  . [START ON 02/07/2019] darbepoetin (ARANESP) injection - DIALYSIS  100 mcg Intravenous Q Tue-HD  . gabapentin  300 mg Oral QHS  . heparin  5,000 Units Subcutaneous Q8H  . Melatonin  4.5 mg Oral QHS  . pantoprazole  40 mg Oral Daily    Continuous Infusions: . sodium chloride    . sodium chloride    . calcium gluconate    . [START ON 02/07/2019] ferric gluconate (FERRLECIT/NULECIT) IV       LOS:  1 day        Aline August, MD [facility] 02/06/2019, 10:25 AM

## 2019-02-06 NOTE — Plan of Care (Signed)
  Problem: Activity: Goal: Risk for activity intolerance will decrease Outcome: Progressing   

## 2019-02-06 NOTE — PMR Pre-admission (Signed)
PMR Admission Coordinator Pre-Admission Assessment  Patient: Michael Riggs is an 48 y.o., male MRN: [id number] DOB: 03/21/1971 Height: 5' 10" (177.8 cm) Weight: (!) 139.7 kg  Insurance Information HMO: yes    PPO:      PCP:      IPA:      80/20:      OTHER: PRIMARY: UHC Medicare      Policy#: [id number]      Subscriber: Patient CM Name: Michael Riggs      Phone#: [phone] [id number]     Fax#: [phone] Pre-Cert#: [id number]      Employer:  [employer]auf[employer] provided by Michael Riggs on 7/22 for admit to CIR. Pt is approved for 7 days (7/22-7/28). Follow up CM is Princess (p): [phone] Benefits:  Phone #: online     Name: [email or web address] Eff. Date: 01/18/2019     Deduct: $0      Out of Pocket Max: $4,500 [phone] met)      Life Max: NA CIR: $325/day co pay for days 1-5, $0/day for co-pay for days 6+      SNF: $0/day co pay for days 1-20, $160/day co-pay for days 21-49, $0/day co-pay for days 50-100; limited to 100 days/cal yr.  Outpatient: limited by medical necessity     Co-Pay: $35/visit Home Health: 100%; limited by medical necessity       Co-Pay: 0% DME: 80%     Co-Pay: 20% Providers:  SECONDARY: Medicaid [place]      Policy#: [id number]      Subscriber: Patient **Pt is QMB ONLY Coverage Code: MQBQN CM Name:       Phone#:      Fax#:  Pre-Cert#:       Employer:  Benefits:  Phone #: verified via automated system: [phone]     Name: automated system Eff. Date: verified eligibility on 02/07/2019     Deduct:       Out of Pocket Max:       Life Max:  CIR:       SNF:  Outpatient:      Co-Pay:  Home Health:       Co-Pay:  DME:      Co-Pay:   Medicaid Application Date:       Case Manager:  Disability Application Date:       Case Worker:   The "Data Collection Information Summary" for patients in Inpatient Rehabilitation Facilities with attached "Privacy Act [place] Records" was provided and verbally reviewed with: Patient  Emergency Contact Information Contact  Information    Name Relation Home Work Mobile   Michael Riggs Brother [phone]     Michael Riggs, Michael Riggs Mother [phone]        Current Medical History  Patient Admitting Diagnosis: Debility History of Present Illness: Michael Riggs is a 48 year old male history of syphilis, anemia of chronic disease, chronic kidney disease with recent dialysis initiated, diabetes mellitus, morbid obesity with BMI of 43.78, hypertension and left BKA 5 years ago in [facility]. Recent admission 01/30/2019 to 02/03/2019 for uremia with acute kidney injury receiving tunneled dialysis catheter 02/02/2019 and was to begin outpatient hemodialysis at [facility] clinic Tuesday Thursday Saturday.  Patient presented 02/04/2019 with increasing shortness of breath as well as scrotal edema.  Chest x-ray showed mild vascular congestion.  There was noted skin tears of the right lower extremity patient had noted fall since recent discharge home.  Hemoglobin 7.9, creatinine 8.82, calcium 6.3, COVID negative.  Hemodialysis again follow-up and did initially  receive an extra treatment of hemodialysis for suspect volume overload.WOC follow-up for wounds to right lower leg with dressing changes as directed.  Subcutaneous heparin for DVT prophylaxis.  Therapy evaluations completed with recommendations made for CIR. Patient is to be admitted for a comprehensive rehab program on 02/08/2019.    Patient's medical record from [facility] has been reviewed by the rehabilitation admission coordinator and physician.  Past Medical History  Past Medical History:  Diagnosis Date  . Anemia   . Chronic kidney disease    Stage 5  . Depression   . Diabetes mellitus without complication ([place])   . Dyspnea   . GERD (gastroesophageal reflux disease)   . Hypertension     Family History   family history is not on file.  Prior Rehab/Hospitalizations Has the patient had prior rehab or hospitalizations prior to  admission? Yes  Has the patient had major surgery during 100 days prior to admission? Yes   Current Medications  Current Facility-Administered Medications:  .  acetaminophen (TYLENOL) tablet 650 mg, 650 mg, Oral, Q6H PRN, 650 mg at 02/06/19 0848 **OR** acetaminophen (TYLENOL) suppository 650 mg, 650 mg, Rectal, Q6H PRN, Rise Patience, MD .  albuterol (PROVENTIL) (2.5 MG/3ML) 0.083% nebulizer solution 3 mL, 3 mL, Inhalation, Q6H PRN, Rise Patience, MD .  amLODipine (NORVASC) tablet 10 mg, 10 mg, Oral, Daily, Rise Patience, MD, 10 mg at 02/08/19 0913 .  atorvastatin (LIPITOR) tablet 40 mg, 40 mg, Oral, Daily, Rise Patience, MD, 40 mg at 02/08/19 0913 .  calcitRIOL (ROCALTROL) capsule 0.25 mcg, 0.25 mcg, Oral, Daily, Finnigan, Nancy A, DO, 0.25 mcg at 02/08/19 0913 .  calcium carbonate (TUMS - dosed in mg elemental calcium) chewable tablet 200 mg of elemental calcium, 1 tablet, Oral, BID WC, Alekh, Kshitiz, MD, 200 mg of elemental calcium at 02/08/19 1655 .  Chlorhexidine Gluconate Cloth 2 % PADS 6 each, 6 each, Topical, Q0600, Finnigan, Nancy A, DO, 6 each at 02/07/19 [phone] .  Chlorhexidine Gluconate Cloth 2 % PADS 6 each, 6 each, Topical, Q0600, Rexene Agent, MD, 6 each at 02/08/19 0600 .  citalopram (CELEXA) tablet 40 mg, 40 mg, Oral, Daily, Rise Patience, MD, 40 mg at 02/08/19 0913 .  collagenase (SANTYL) ointment, , Topical, Daily, Alekh, Kshitiz, MD .  Darbepoetin Alfa (ARANESP) injection 100 mcg, 100 mcg, Intravenous, Q Tue-HD, Finnigan, Nancy A, DO, 100 mcg at 02/07/19 1546 .  ferric gluconate (NULECIT) 62.5 mg in sodium chloride 0.9 % 100 mL IVPB, 62.5 mg, Intravenous, Q T,Th,Sa-HD, Finnigan, Nancy A, DO, Last Rate: 105 mL/hr at 02/07/19 1500, 62.5 mg at 02/07/19 1500 .  gabapentin (NEURONTIN) capsule 300 mg, 300 mg, Oral, QHS, Rise Patience, MD, 300 mg at 02/07/19 2144 .  guaiFENesin-dextromethorphan (ROBITUSSIN DM) 100-10 MG/5ML syrup 10 mL, 10  mL, Oral, Q4H PRN, Alekh, Kshitiz, MD, 10 mL at 02/06/19 0930 .  heparin injection 5,000 Units, 5,000 Units, Subcutaneous, Q8H, Rise Patience, MD, 5,000 Units at 02/08/19 1259 .  hydrOXYzine (ATARAX/VISTARIL) tablet 25 mg, 25 mg, Oral, Q8H PRN, Rise Patience, MD, 25 mg at 02/08/19 0913 .  Melatonin TABS 4.5 mg, 4.5 mg, Oral, QHS, Rise Patience, MD, 4.5 mg at 02/07/19 2144 .  pantoprazole (PROTONIX) EC tablet 40 mg, 40 mg, Oral, Daily, Rise Patience, MD, 40 mg at 02/08/19 0913 .  polyethylene glycol (MIRALAX / GLYCOLAX) packet 17 g, 17 g, Oral, Daily, Horris Latino, Adline Peals, MD, 17 g at  02/08/19 1118 .  senna-docusate (Senokot-S) tablet 1 tablet, 1 tablet, Oral, BID, Alma Friendly, MD, 1 tablet at 02/08/19 1118 .  traMADol (ULTRAM) tablet 50 mg, 50 mg, Oral, Q6H PRN, Starla Link, Kshitiz, MD, 50 mg at 02/08/19 0913  Patients Current Diet:  Diet Order            Diet renal/carb modified with fluid restriction Diet-HS Snack? Nothing; Fluid restriction: 1200 mL Fluid; Room service appropriate? Yes; Fluid consistency: Thin  Diet effective now              Precautions / Restrictions Precautions Precautions: Fall, Other (comment) Precaution Comments: L BKA; prosthetic is in room Restrictions Weight Bearing Restrictions: No   Has the patient had 2 or more falls or a fall with injury in the past year? Yes  Prior Activity Level Community (5-7x/wk): would go out of apartment to get food for himself and his mother; able to walk with prothesis without AD; but recently had to use wc since recent decline  Prior Functional Level Self Care: Did the patient need help bathing, dressing, using the toilet or eating? Independent  Indoor Mobility: Did the patient need assistance with walking from room to room (with or without device)? Independent  Stairs: Did the patient need assistance with internal or external stairs (with or without device)? Dependent  Functional  Cognition: Did the patient need help planning regular tasks such as shopping or remembering to take medications? Independent  Home Assistive Devices / Equipment Home Assistive Devices/Equipment: CBG Meter, Wheelchair Home Equipment: Wheelchair - manual  Prior Device Use: Indicate devices/aids used by the patient prior to current illness, exacerbation or injury? no AD for ambulation (besides prothesis); has wc at home as needed (has had to use more recently, but unsure if he fits now)  Current Functional Level Cognition  Overall Cognitive Status: Within Functional Limits for tasks assessed Orientation Level: Oriented X4 General Comments: Participating well    Extremity Assessment (includes Sensation/Coordination)  Upper Extremity Assessment: Generalized weakness  Lower Extremity Assessment: Defer to PT evaluation RLE Deficits / Details: swelling RLE Sensation: decreased light touch LLE Deficits / Details: increased swelling    ADLs  Overall ADL's : Needs assistance/impaired Eating/Feeding: Set up, Bed level Grooming: Set up, Bed level, Wash/dry hands, Wash/dry face Grooming Details (indicate cue type and reason): bed more in chair position Upper Body Bathing: Set up, Bed level Lower Body Bathing: Maximal assistance, Bed level Lower Body Bathing Details (indicate cue type and reason): unable to tolerate movement of LB for scrotal sling Upper Body Dressing : Set up, Sitting Lower Body Dressing: Maximal assistance, Bed level Lower Body Dressing Details (indicate cue type and reason): max A to don Scrotal sling in supine Toilet Transfer: Maximal assistance, +2 for physical assistance, +2 for safety/equipment Toileting- Clothing Manipulation and Hygiene: Maximal assistance, +2 for physical assistance, +2 for safety/equipment, Bed level Toileting - Clothing Manipulation Details (indicate cue type and reason): pt reports using bed pain - severe pain Functional mobility during ADLs: Total  assistance General ADL Comments: set-upA for UB ADL and severe pain- pt unable to tolerate movement of LB for scrotal sling    Mobility  Overal bed mobility: Needs Assistance Bed Mobility: Supine to Sit, Sit to Supine Rolling: Mod assist Supine to sit: Max assist, +2 for physical assistance Sit to supine: Max assist, +2 for physical assistance, +2 for safety/equipment General bed mobility comments: max A +2 today due to pain, tolerated short duration of sitting EOB with posterior  lean    Transfers  General transfer comment: unable today due to pain, patient reports eagerness to attempt     Ambulation / Gait / Stairs / Office manager / Balance Dynamic Sitting Balance Sitting balance - Comments: Tending to lean back with difficulty flexing hips to lean forward due to scrotal edema and general body habitus; able to reach and support self with bil UE support on seated surface and holding foot board; very uncomfortable, and requested to lay back down Balance Overall balance assessment: Needs assistance Sitting balance-Leahy Scale: Poor(approaching Fair) Sitting balance - Comments: Tending to lean back with difficulty flexing hips to lean forward due to scrotal edema and general body habitus; able to reach and support self with bil UE support on seated surface and holding foot board; very uncomfortable, and requested to lay back down Postural control: Posterior lean    Special needs/care consideration BiPAP/CPAP : reports use of CPAP during hospital admission  CPM : no Continuous Drip IV : no Dialysis : HD (new start -please call Terri Piedra prior to DC to get seat set up (7am start time)       Days : T/TH/Sat Life Vest : no Oxygen : no Special Bed : no Trach Size : no Wound Vac (area) : no      Location : no Skin : abrasion to right anterior (multiple open wounds) and posterior foot, amputation to LLE, serous blister to right leg, MASD to lower mid abdomen, rash to  mid left chest; swollen, enlarged scrotum                    Bowel mgmt: continent, last BM: 02/05/2019 Bladder mgmt: oliguria  Diabetic mgmt: yes Behavioral consideration : can be inappropriate at times (makes adult jokes often) Chemo/radiation : no   Previous Home Environment (from acute therapy documentation) Living Arrangements: Parent Available Help at Discharge: Family Type of Home: House Home Layout: Two level, Able to live on main level with bedroom/bathroom Home Access: Stairs to enter Entrance Stairs-Number of Steps: 1 Home Care Services: Yes Type of Home Care Services: [place] (if known): unable to recollect Additional Comments: Has prosthetic but reports it is at home. Reports he has not fit in prosthetic lately secondary to swelling.   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, food delivery, supervision)) Contact Information: main contact: sister in law Arrie Aran): [phone] (pt's brother and mother do not speak english and this is pt's preferred contact for exchange of information) Anticipated Caregiver: Sister in law Engineer, manufacturing systems), brother (Moises), neice, other family members Anticipated Ambulance person Information: see above Ability/Limitations of Caregiver: Min A Caregiver Availability: 24/7 Discharge Plan Discussed with Primary Caregiver: Yes Is Caregiver In Agreement with Plan?: Yes Does Caregiver/Family have  Issues with Lodging/Transportation while Pt is in Rehab?: No  Goals/Additional Needs Patient/Family Goal for Rehab: PT/OT: Min A; SLP: NA Expected length of stay: 10-14 days Cultural Considerations: NA Dietary Needs: renal/carb modified with fluid restriction; Diet-HS snack: nothing; fluid restriction 1200 mL fluid. Fluid consistency: thin.  Equipment  Needs: TBD Special Service Needs: HD: T/Th/Sat -has been clipped -contact Terri Piedra for details Pt/Family Agrees to Admission and willing to participate: Yes Program Orientation Provided & Reviewed with Pt/Caregiver Including Roles  & Responsibilities: Yes(with pt's daughter in law (dawn) who is to translate for fam)  Barriers to Discharge: Medical stability, Incontinence, Weight, Hemodialysis, Weight bearing restrictions  Barriers to Discharge Comments: will need to be able to stand and pivot with min G/min A; has not yet started OP HD, old BKA on LLE-hoping to fit into prothesis once swelling goes down to assist with transfers  Decrease burden of Care through IP rehab admission: NA  Possible need for SNF placement upon discharge: Not anticipated. Pt has necessary support at DC to support short CIR stay. Pt has wc accessible DC environment to support safe return home.   Patient Condition: I have reviewed medical records from [facility], spoken with Terri Piedra (Dialysis coordinator), and patient and family member. I met with patient at the bedside for inpatient rehabilitation assessment.  Patient will benefit from ongoing PT and OT, can actively participate in 3 hours of therapy a day 5 days of the week, and can make measurable gains during the admission.  Patient will also benefit from the coordinated team approach during an Inpatient Acute Rehabilitation admission.  The patient will receive intensive therapy as well as Rehabilitation physician, nursing, social worker, and care management interventions.  Due to bladder  management, bowel management, safety, skin/wound care, disease management, medication administration, pain management and patient education the patient requires 24 hour a day rehabilitation nursing.  The patient is currently Max A x2 with bed mobility and Max A x2 for bed level toileting ADLs.  Discharge setting and therapy post discharge at home with home health is anticipated.  Patient has agreed to participate in the Acute Inpatient Rehabilitation Program and will admit 02/08/2019.  Preadmission Screen Completed By:  Jhonnie Garner, 02/08/2019 4:59 PM ______________________________________________________________________   Discussed status with Dr. Letta Pate on 02/08/2019 at 5:00PM and received approval for admission today.  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 Does the need for close, 24 hr/day Medical supervision in concert with the patient's rehab needs make it unreasonable for this patient to be served in a less intensive setting? Yes 2. Co-Morbidities requiring supervision/potential complications: Right BKA, severe scrotal edema 3. Due to bladder management, bowel management, safety, skin/wound care, disease management, medication administration, pain management and patient education, does the patient require 24 hr/day rehab nursing? Yes 4. Does the patient require coordinated care of a physician, rehab nurse, PT (1-2 hrs/day, 5 days/week) and OT (1-2 hrs/day, 5 days/week) to address physical and functional deficits in the context of the above medical diagnosis(es)? Yes Addressing deficits in the following areas: balance, endurance, locomotion, strength, transferring, bowel/bladder control, bathing, dressing, toileting and psychosocial support 5. Can the patient actively participate in an intensive therapy program of at least 3 hrs of therapy 5 days a week? Yes 6. The potential for patient to make measurable gains  while on inpatient rehab is good 7. Anticipated functional outcomes upon discharge from inpatients are: min assist PT, min assist OT, n/a SLP 8. Estimated rehab length of stay to reach the above functional goals is: 10-14d 9. Anticipated D/C setting: Home 10. Anticipated post D/C treatments: [place] therapy 11. Overall Rehab/Functional Prognosis: good  MD Signature: Charlett Blake M.D. [facility] FAAPM&R (Sports Med, Neuromuscular Med)  Diplomate Am Board of Electrodiagnostic Med

## 2019-02-06 NOTE — Procedures (Signed)
I evaluated the patient while on hemodialysis.  I reviewed the patient's treatment plan.  Patient tolerating dialysis well.  Discussed with RN.  

## 2019-02-06 NOTE — Progress Notes (Signed)
Inpatient Rehab Admissions:  Inpatient Rehab Consult received.  I met with pt at the bedside for rehabilitation assessment and to discuss goals and expectations of an inpatient rehab admission. Pt highly interested in CIR. With permission, [facility] contacted his sister-in-law [facility]) to confirm caregiver support. Support confirmed and family would really like for pt to go to CIR to improve endurance, mobility, and safety. They are also concerned about his cognition at this time and are hopeful that can be addressed.   AC will begin insurance authorization process for possible admit.    **Also noted that Covid-19 test is still pending from 7/18. This will need to be completed prior to potential CIR admission.   Jhonnie Garner, OTR/L  Rehab Admissions Coordinator  [phone] 02/06/2019 4:39 PM

## 2019-02-06 NOTE — Progress Notes (Signed)
PT Cancellation Note  Patient Details Name: Michael Riggs MRN: [id number] DOB: 1971/05/17   Cancelled Treatment:    Reason Eval/Treat Not Completed: Patient at procedure or test/unavailable   Currently in HD;  Will follow up later today as time allows;  Otherwise, will follow up for PT tomorrow;   Thank you,  Roney Marion, PT  Acute Rehabilitation Services Pager [phone] Office [place] 02/06/2019, 8:15 AM

## 2019-02-06 NOTE — Progress Notes (Signed)
[facility]    NEPHROLOGY PROGRESS NOTE  SUBJECTIVE: Patient seen and examined on dialysis today.  He denies any fevers or chills, chest pain, nausea, or other associated symptoms.  The remained of his ROS was negative.  OBJECTIVE:  Vitals:   02/06/19 1000 02/06/19 1030  BP: 110/70 (!) 99/40  Pulse: 80 63  Resp: 15 14  Temp:    SpO2:      Intake/Output Summary (Last 24 hours) at 02/06/2019 1117 Last data filed at 02/06/2019 0600 Gross per 24 hour  Intake 660 ml  Output 0 ml  Net 660 ml      General:  AAOx3 NAD HEENT: MMM [place] AT anicteric sclera Neck:  No JVD, no adenopathy CV:  Heart RRR  Lungs:  L/S CTA bilaterally Abd:  abd SNT/ND with normal BS GU:  Bladder non-palpable, positive scrotal edema Extremities: +1 bilateral lower extremity edema, multiple skin tears noted to bilateral lower extremities.  Brawny skin changes noted to bilateral lower extremities. Skin:  No skin rash  MEDICATIONS:  . acetaminophen      . amLODipine  10 mg Oral Daily  . atorvastatin  40 mg Oral Daily  . calcitRIOL      . calcitRIOL  0.25 mcg Oral Daily  . Chlorhexidine Gluconate Cloth  6 each Topical Q0600  . citalopram  40 mg Oral Daily  . [START ON 02/07/2019] darbepoetin (ARANESP) injection - DIALYSIS  100 mcg Intravenous Q Tue-HD  . gabapentin  300 mg Oral QHS  . heparin  5,000 Units Subcutaneous Q8H  . Melatonin  4.5 mg Oral QHS  . pantoprazole  40 mg Oral Daily       LABS:   CBC Latest Ref Rng & Units 02/06/2019 02/05/2019 02/04/2019  WBC 4.0 - 10.5 K/uL 15.2(H) 11.8(H) 13.3(H)  Hemoglobin 13.0 - 17.0 g/dL 7.5(L) 7.3(L) 8.0(L)  Hematocrit 39.0 - 52.0 % 24.4(L) 23.6(L) 26.0(L)  Platelets 150 - 400 K/uL 150 146(L) 202    CMP Latest Ref Rng & Units 02/06/2019 02/05/2019 02/04/2019  Glucose 70 - 99 mg/dL 106(H) 134(H) -  BUN 6 - 20 mg/dL 40(H) 32(H) -  Creatinine 0.61 - 1.24 mg/dL 7.31(H) 5.96(H) 8.81(H)  Sodium 135 - 145 mmol/L 131(L) 133(L) -  Potassium 3.5 - 5.1  mmol/L 3.9 3.6 -  Chloride 98 - 111 mmol/L 96(L) 98 -  CO2 22 - 32 mmol/L 25 25 -  Calcium 8.9 - 10.3 mg/dL 6.5(L) 6.3(LL) -  Total Protein 6.5 - 8.1 g/dL - - -  Total Bilirubin 0.3 - 1.2 mg/dL - - -  Alkaline Phos 38 - 126 U/L - - -  AST 15 - 41 U/L - - -  ALT 0 - 44 U/L - - -    Lab Results  Component Value Date   PTH 144 (H) 02/03/2019   CALCIUM 6.5 (L) 02/06/2019   CAION 1.01 (L) 01/30/2019   PHOS 3.7 02/06/2019       Component Value Date/Time   COLORURINE YELLOW 01/31/2019 2315   APPEARANCEUR CLOUDY (A) 01/31/2019 2315   LABSPEC 1.012 01/31/2019 2315   PHURINE 5.0 01/31/2019 2315   GLUCOSEU 50 (A) 01/31/2019 2315   HGBUR SMALL (A) 01/31/2019 2315   BILIRUBINUR NEGATIVE 01/31/2019 2315   KETONESUR NEGATIVE 01/31/2019 2315   PROTEINUR >=300 (A) 01/31/2019 2315   NITRITE NEGATIVE 01/31/2019 2315   LEUKOCYTESUR NEGATIVE 01/31/2019 2315      Component Value Date/Time   PHART 7.221 (L) 01/31/2019 1115   PCO2ART 34.9  01/31/2019 1115   PO2ART 80.0 (L) 01/31/2019 1115   HCO3 13.8 (L) 01/31/2019 1115   TCO2 12 (L) 01/30/2019 2339   ACIDBASEDEF 12.4 (H) 01/31/2019 1115   O2SAT 93.8 01/31/2019 1115       Component Value Date/Time   IRON 65 01/31/2019 0248   TIBC 119 (L) 01/31/2019 0248   FERRITIN 284 01/31/2019 0248   IRONPCTSAT 55 (H) 01/31/2019 0248       ASSESSMENT/PLAN:    1.  End-stage renal disease on hemodialysis.  Plan for Tuesday, Thursday, Saturday dialysis.  Has dialysis planned in [place].  Is dialyzing via a tunneled catheter while AV fistula matures.  Will attempt to ultrafilter more on hemodialysis.  Receiving an additional treatment today, and will resume Tuesday, Thursday, Saturday Alysis schedule tomorrow.   2.  Scrotal edema.  Hopefully will improve with ultrafiltration.  Would plan to elevate while in bed.  3.  Ambulatory dysfunction.  Recommend PT evaluation.  May need transfer to rehab.  4.  Hypocalcemia.  Continue calcitriol and calcium  carbonate.  PTH on 02/03/2019 was 144.  5.  Anemia of CKD.  Will add IV iron and Aranesp on HD.    [place], DO, [place]

## 2019-02-06 NOTE — Progress Notes (Signed)
OT Cancellation Note  Patient Details Name: Michael Riggs MRN: [id number] DOB: 01-16-1971   Cancelled Treatment:    Reason Eval/Treat Not Completed: Patient at procedure or test/ unavailable(HD).  [name] [name] 02/06/2019, 8:27 AM   [name] OTR/L Acute Rehabilitation Services Pager: [phone] Office: [phone]

## 2019-02-07 ENCOUNTER — Inpatient Hospital Stay: Payer: Medicaid Other | Admitting: Vascular Surgery

## 2019-02-07 ENCOUNTER — Inpatient Hospital Stay (HOSPITAL_COMMUNITY): Admit: 2019-02-07 | Payer: Medicaid Other

## 2019-02-07 DIAGNOSIS — E669 Obesity, unspecified: Secondary | ICD-10-CM

## 2019-02-07 DIAGNOSIS — E8770 Fluid overload, unspecified: Principal | ICD-10-CM

## 2019-02-07 DIAGNOSIS — N186 End stage renal disease: Secondary | ICD-10-CM

## 2019-02-07 DIAGNOSIS — E1169 Type 2 diabetes mellitus with other specified complication: Secondary | ICD-10-CM

## 2019-02-07 LAB — MAGNESIUM: Magnesium: 1.6 mg/dL — ABNORMAL LOW (ref 1.7–2.4)

## 2019-02-07 LAB — BASIC METABOLIC PANEL
Anion gap: 9 (ref 5–15)
BUN: 27 mg/dL — ABNORMAL HIGH (ref 6–20)
CO2: 24 mmol/L (ref 22–32)
Calcium: 6.8 mg/dL — ABNORMAL LOW (ref 8.9–10.3)
Chloride: 96 mmol/L — ABNORMAL LOW (ref 98–111)
Creatinine, Ser: 5.2 mg/dL — ABNORMAL HIGH (ref 0.61–1.24)
GFR calc Af Amer: 14 mL/min — ABNORMAL LOW (ref >60)
GFR calc non Af Amer: 12 mL/min — ABNORMAL LOW (ref >60)
Glucose, Bld: 168 mg/dL — ABNORMAL HIGH (ref 70–99)
Potassium: 3.9 mmol/L (ref 3.5–5.1)
Sodium: 129 mmol/L — ABNORMAL LOW (ref 135–145)

## 2019-02-07 LAB — CBC WITH DIFFERENTIAL/PLATELET
Abs Immature Granulocytes: 0.35 10*3/uL — ABNORMAL HIGH (ref 0.00–0.07)
Basophils Absolute: 0.1 10*3/uL (ref 0.0–0.1)
Basophils Relative: 0 %
Eosinophils Absolute: 0.5 10*3/uL (ref 0.0–0.5)
Eosinophils Relative: 4 %
HCT: 22.9 % — ABNORMAL LOW (ref 39.0–52.0)
Hemoglobin: 7 g/dL — ABNORMAL LOW (ref 13.0–17.0)
Immature Granulocytes: 2 %
Lymphocytes Relative: 7 %
Lymphs Abs: 1.1 10*3/uL (ref 0.7–4.0)
MCH: 29.5 pg (ref 26.0–34.0)
MCHC: 30.6 g/dL (ref 30.0–36.0)
MCV: 96.6 fL (ref 80.0–100.0)
Monocytes Absolute: 1.5 10*3/uL — ABNORMAL HIGH (ref 0.1–1.0)
Monocytes Relative: 10 %
Neutro Abs: 11.3 10*3/uL — ABNORMAL HIGH (ref 1.7–7.7)
Neutrophils Relative %: 77 %
Platelets: 120 10*3/uL — ABNORMAL LOW (ref 150–400)
RBC: 2.37 MIL/uL — ABNORMAL LOW (ref 4.22–5.81)
RDW: 12.4 % (ref 11.5–15.5)
WBC: 14.8 10*3/uL — ABNORMAL HIGH (ref 4.0–10.5)
nRBC: 0 % (ref 0.0–0.2)

## 2019-02-07 LAB — GLUCOSE, CAPILLARY
Glucose-Capillary: 114 mg/dL — ABNORMAL HIGH (ref 70–99)
Glucose-Capillary: 114 mg/dL — ABNORMAL HIGH (ref 70–99)
Glucose-Capillary: 338 mg/dL — ABNORMAL HIGH (ref 70–99)
Glucose-Capillary: 160 mg/dL — ABNORMAL HIGH (ref 70–99)

## 2019-02-07 MED ORDER — MAGNESIUM SULFATE 2 GM/50ML IV SOLN
2.0000 g | Freq: Once | INTRAVENOUS | Status: AC
  Administered 2019-02-07: 2 g via INTRAVENOUS
  Filled 2019-02-07: qty 50

## 2019-02-07 MED ORDER — DARBEPOETIN ALFA 100 MCG/0.5ML IJ SOSY
PREFILLED_SYRINGE | INTRAMUSCULAR | Status: AC
  Administered 2019-02-07: 100 ug via INTRAVENOUS
  Filled 2019-02-07: qty 0.5

## 2019-02-07 MED ORDER — CHLORHEXIDINE GLUCONATE CLOTH 2 % EX PADS
6.0000 | MEDICATED_PAD | Freq: Every day | CUTANEOUS | Status: DC
  Administered 2019-02-08: 6 via TOPICAL

## 2019-02-07 MED ORDER — CALCIUM CARBONATE ANTACID 500 MG PO CHEW
1.0000 | CHEWABLE_TABLET | Freq: Two times a day (BID) | ORAL | Status: DC
  Administered 2019-02-07 – 2019-02-08 (×4): 200 mg via ORAL
  Filled 2019-02-07 (×4): qty 1

## 2019-02-07 MED ORDER — HEPARIN SODIUM (PORCINE) 1000 UNIT/ML IJ SOLN
INTRAMUSCULAR | Status: AC
  Administered 2019-02-07: 3800 [IU] via INTRAVENOUS_CENTRAL
  Filled 2019-02-07: qty 4

## 2019-02-07 MED ORDER — CALCIUM GLUCONATE-NACL 2-0.675 GM/100ML-% IV SOLN
2.0000 g | INTRAVENOUS | Status: AC
  Administered 2019-02-07: 2000 mg via INTRAVENOUS
  Filled 2019-02-07: qty 100

## 2019-02-07 NOTE — Progress Notes (Signed)
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  Pt now resting well on 8.0 CM H20

## 2019-02-07 NOTE — Progress Notes (Signed)
Inpatient Rehabilitation-Admissions Coordinator   [facility] still waiting on determination from insurance regarding CIR request. Will follow up tomorrow.   Jhonnie Garner, OTR/L  Rehab Admissions Coordinator  [phone] 02/07/2019 5:08 PM

## 2019-02-07 NOTE — Progress Notes (Signed)
VAST consulted to place IV. VAST RN has gone by pt's room 4 times since order placed and pt has been out of room each time. VAST RN called and spoke to pt's nurse who stated he is still in Dialysis at this time. VAST RN educated that [facility] currently has no active orders for IV meds or fluids. Advised unit nurse to contact IVT if pt needs an IV placed upon return from dialysis.

## 2019-02-07 NOTE — Progress Notes (Signed)
[name], male   DOB: 08-21-1970, 48 y.o.   MRN: [id number]  PROGRESS NOTE    Michael Riggs  ZHG:[id number] DOB: Nov 06, 1970 DOA: 02/04/2019 PCP: [name], MD   Brief Narrative:  48 year old male with history of chronic renal disease who was recently admitted for acute metabolic encephalopathy in the setting of uremia, was started on dialysis and discharged home on 02/03/2019 presented on 02/03/2021 [facility] for shortness of breath and weakness and was transferred to [facility] for the need for requiring dialysis.  He was found to have mild vascular congestion on chest x-ray.  Initial COVID-19 testing was negative at [place].  Nephrology was consulted.  PT recommended CIR patient.  Assessment & Plan:   End-stage renal disease recently started on dialysis Anasarca along with volume overload -Nephrology following.  Dialysis as per nephrology schedule.  Hypocalcemia -Replace.  Repeat a.m. labs  Generalized deconditioning with ambulatory dysfunction -PT recommends CIR.  CIR consulted.  Hypertension -Continue amlodipine.  Blood pressure stable  Hyperlipidemia -Continue statin  Diabetes mellitus type 2 -Recent hemoglobin was 5.  Blood sugars stable.  Continue CBGs with SSI  Leukocytosis-probably reactive.  Improving.  Monitor  Multiple skin tears and ulceration of the right lower extremity -Wound care as per wound care recommendations.  Anemia of chronic disease-hemoglobin stable.  Monitor.  Transfuse if hemoglobin is less than 7.  History of syphilis -[facility] as an outpatient with penicillin [facility] recently.  Outpatient follow-up.  Morbid obesity  -Outpatient follow-up  DVT prophylaxis: Heparin Code Status: Full Family Communication: Spoke to patient at bedside Disposition Plan: Probable CIR once bed is available.  Consultants: Nephrology  Procedures: None  Antimicrobials: None   Subjective: Patient seen  and examined at bedside.  He denies any worsening shortness of breath, fever, abdominal pain or diarrhea.  Still has intermittent cough.  Objective: Vitals:   02/06/19 2123 02/07/19 0333 02/07/19 0529 02/07/19 0848  BP: 123/80  117/76 123/69  Pulse: 81  79 81  Resp: 20  20 16   Temp: 98.7 F (37.1 C)  98.3 F (36.8 C) 98.4 F (36.9 C)  TempSrc: Oral  Oral Oral  SpO2: 97%  100% 97%  Weight: (!) 138.4 kg (!) 138.4 kg    Height:        Intake/Output Summary (Last 24 hours) at 02/07/2019 1040 Last data filed at 02/07/2019 0558 Gross per 24 hour  Intake 1080 ml  Output 4000 ml  Net -2920 ml   Filed Weights   02/06/19 1123 02/06/19 2123 02/07/19 0333  Weight: (!) 138.5 kg (!) 138.4 kg (!) 138.4 kg    Examination:  General exam: No distress.  Looks older than stated age.   Respiratory system: Bilateral decreased breath sounds at bases with some basilar crackles.   Cardiovascular system: S1-S2 heard, rate controlled. Gastrointestinal system: Abdomen is morbidly obese, nondistended, soft and nontender. Normal bowel sounds heard. Extremities: No cyanosis; right lower extremity dressing present.  Left BKA present.     Data Reviewed: I have personally reviewed following labs and imaging studies  CBC: Recent Labs  Lab 02/04/19 0532 02/04/19 0625 02/05/19 0310 02/06/19 0636 02/07/19 0034  WBC 13.5* 13.3* 11.8* 15.2* 14.8*  NEUTROABS 9.8*  --  8.3* 11.9* 11.3*  HGB 7.8* 8.0* 7.3* 7.5* 7.0*  HCT 25.2* 26.0* 23.6* 24.4* 22.9*  MCV 96.6 97.0 96.3 98.4 96.6  PLT 200 202 146* 150 196*   Basic Metabolic Panel: Recent Labs  Lab 02/01/19 [phone]  02/02/19 0322 02/03/19 0342 02/04/19 0532 02/04/19 0625 02/05/19 0310 02/06/19 0742 02/07/19 0034  NA 141 137 134* 133*  --  133* 131* 129*  K 5.0 4.1 4.0 3.8  --  3.6 3.9 3.9  CL 107 102 100 100  --  98 96* 96*  CO2 19* 20* 24 21*  --  25 25 24   GLUCOSE 105* 116* 126* 98  --  134* 106* 168*  BUN 122* 84* 50* 57*  --  32* 40* 27*   CREATININE 16.01* 11.77* 8.04* 8.82* 8.81* 5.96* 7.31* 5.20*  CALCIUM 6.2* 6.1* 6.4* 6.3*  --  6.3* 6.5* 6.8*  MG  --   --   --   --   --   --   --  1.6*  PHOS 8.9* 6.1* 4.2  --   --   --  3.7  --    GFR: Estimated Creatinine Clearance: 24.9 mL/min (A) (by C-G formula based on SCr of 5.2 mg/dL (H)). Liver Function Tests: Recent Labs  Lab 02/01/19 0718 02/02/19 0322 02/03/19 0342 02/04/19 0532 02/06/19 0742  AST  --   --   --  26  --   ALT  --   --   --  7  --   ALKPHOS  --   --   --  74  --   BILITOT  --   --   --  0.3  --   PROT  --   --   --  5.6*  --   ALBUMIN 1.6* 1.6* 1.5* 1.6* 1.5*   No results for input(s): LIPASE, AMYLASE in the last 168 hours. No results for input(s): AMMONIA in the last 168 hours. Coagulation Profile: No results for input(s): INR, PROTIME in the last 168 hours. Cardiac Enzymes: No results for input(s): CKTOTAL, CKMB, CKMBINDEX, TROPONINI in the last 168 hours. BNP (last 3 results) No results for input(s): PROBNP in the last 8760 hours. HbA1C: No results for input(s): HGBA1C in the last 72 hours. CBG: Recent Labs  Lab 02/05/19 2118 02/06/19 1208 02/06/19 1615 02/06/19 2123 02/07/19 0659  GLUCAP 135* 77 145* 167* 114*   Lipid Profile: No results for input(s): CHOL, HDL, LDLCALC, TRIG, CHOLHDL, LDLDIRECT in the last 72 hours. Thyroid Function Tests: No results for input(s): TSH, T4TOTAL, FREET4, T3FREE, THYROIDAB in the last 72 hours. Anemia Panel: No results for input(s): VITAMINB12, FOLATE, FERRITIN, TIBC, IRON, RETICCTPCT in the last 72 hours. Sepsis Labs: No results for input(s): PROCALCITON, LATICACIDVEN in the last 168 hours.  Recent Results (from the past 240 hour(s))  SARS Coronavirus 2 (CEPHEID - Performed in [place] hospital lab), Hosp Order     Status: None   Collection Time: 01/30/19  7:58 PM   Specimen: Nasopharyngeal Swab  Result Value Ref Range Status   SARS Coronavirus 2 NEGATIVE NEGATIVE Final    Comment:  (NOTE) If result is NEGATIVE SARS-CoV-2 target nucleic acids are NOT DETECTED. The SARS-CoV-2 RNA is generally detectable in upper and lower  respiratory specimens during the acute phase of infection. The lowest  concentration of SARS-CoV-2 viral copies this assay can detect is 250  copies / mL. A negative result does not preclude SARS-CoV-2 infection  and should not be used as the sole basis for treatment or other  patient management decisions.  A negative result may occur with  improper specimen collection / handling, submission of specimen other  than nasopharyngeal swab, presence of viral mutation(s) within the  areas targeted by this  assay, and inadequate number of viral copies  (<250 copies / mL). A negative result must be combined with clinical  observations, patient history, and epidemiological information. If result is POSITIVE SARS-CoV-2 target nucleic acids are DETECTED. The SARS-CoV-2 RNA is generally detectable in upper and lower  respiratory specimens dur ing the acute phase of infection.  Positive  results are indicative of active infection with SARS-CoV-2.  Clinical  correlation with patient history and other diagnostic information is  necessary to determine patient infection status.  Positive results do  not rule out bacterial infection or co-infection with other viruses. If result is PRESUMPTIVE POSTIVE SARS-CoV-2 nucleic acids MAY BE PRESENT.   A presumptive positive result was obtained on the submitted specimen  and confirmed on repeat testing.  While 2019 novel coronavirus  (SARS-CoV-2) nucleic acids may be present in the submitted sample  additional confirmatory testing may be necessary for epidemiological  and / or clinical management purposes  to differentiate between  SARS-CoV-2 and other Sarbecovirus currently known to infect humans.  If clinically indicated additional testing with an alternate test  methodology [phone]) is advised. The SARS-CoV-2 RNA is  generally  detectable in upper and lower respiratory sp ecimens during the acute  phase of infection. The expected result is Negative. Fact Sheet for Patients:  StrictlyIdeas.no Fact Sheet for Healthcare Providers: BankingDealers.co.za This test is not yet approved or cleared by the [place] FDA and has been authorized for detection and/or diagnosis of SARS-CoV-2 by FDA under an Emergency Use Authorization (EUA).  This EUA will remain in effect (meaning this test can be used) for the duration of the COVID-19 declaration under Section 564(b)(1) of the Act, 21 U.S.C. section 360bbb-3(b)(1), unless the authorization is terminated or revoked sooner. Performed at [facility], [place] [address], [place], [place] [address]   MRSA PCR Screening     Status: None   Collection Time: 01/31/19  3:24 AM   Specimen: Nasopharyngeal  Result Value Ref Range Status   MRSA by PCR NEGATIVE NEGATIVE Final    Comment:        The GeneXpert MRSA Assay (FDA approved for NASAL specimens only), is one component of a comprehensive MRSA colonization surveillance program. It is not intended to diagnose MRSA infection nor to guide or monitor treatment for MRSA infections. Performed at [facility], [place] [address], [place], [place] [address]   SARS Coronavirus 2 (CEPHEID - Performed in [place] hospital lab), Hosp Order     Status: None   Collection Time: 02/06/19  6:10 PM   Specimen: Nasopharyngeal Swab  Result Value Ref Range Status   SARS Coronavirus 2 NEGATIVE NEGATIVE Final    Comment: (NOTE) If result is NEGATIVE SARS-CoV-2 target nucleic acids are NOT DETECTED. The SARS-CoV-2 RNA is generally detectable in upper and lower  respiratory specimens during the acute phase of infection. The lowest  concentration of SARS-CoV-2 viral copies this assay can detect is 250  copies / mL. A negative result does not preclude SARS-CoV-2 infection   and should not be used as the sole basis for treatment or other  patient management decisions.  A negative result may occur with  improper specimen collection / handling, submission of specimen other  than nasopharyngeal swab, presence of viral mutation(s) within the  areas targeted by this assay, and inadequate number of viral copies  (<250 copies / mL). A negative result must be combined with clinical  observations, patient history, and epidemiological information. If result  is POSITIVE SARS-CoV-2 target nucleic acids are DETECTED. The SARS-CoV-2 RNA is generally detectable in upper and lower  respiratory specimens dur ing the acute phase of infection.  Positive  results are indicative of active infection with SARS-CoV-2.  Clinical  correlation with patient history and other diagnostic information is  necessary to determine patient infection status.  Positive results do  not rule out bacterial infection or co-infection with other viruses. If result is PRESUMPTIVE POSTIVE SARS-CoV-2 nucleic acids MAY BE PRESENT.   A presumptive positive result was obtained on the submitted specimen  and confirmed on repeat testing.  While 2019 novel coronavirus  (SARS-CoV-2) nucleic acids may be present in the submitted sample  additional confirmatory testing may be necessary for epidemiological  and / or clinical management purposes  to differentiate between  SARS-CoV-2 and other Sarbecovirus currently known to infect humans.  If clinically indicated additional testing with an alternate test  methodology [phone]) is advised. The SARS-CoV-2 RNA is generally  detectable in upper and lower respiratory sp ecimens during the acute  phase of infection. The expected result is Negative. Fact Sheet for Patients:  StrictlyIdeas.no Fact Sheet for Healthcare Providers: BankingDealers.co.za This test is not yet approved or cleared by the [place] FDA  and has been authorized for detection and/or diagnosis of SARS-CoV-2 by FDA under an Emergency Use Authorization (EUA).  This EUA will remain in effect (meaning this test can be used) for the duration of the COVID-19 declaration under Section 564(b)(1) of the Act, 21 U.S.C. section 360bbb-3(b)(1), unless the authorization is terminated or revoked sooner. Performed at [facility], [place] [address] [place], [place] [address]          Radiology Studies: No results found.      Scheduled Meds: . amLODipine  10 mg Oral Daily  . atorvastatin  40 mg Oral Daily  . calcitRIOL  0.25 mcg Oral Daily  . calcium carbonate  1 tablet Oral BID WC  . Chlorhexidine Gluconate Cloth  6 each Topical Q0600  . citalopram  40 mg Oral Daily  . collagenase   Topical Daily  . darbepoetin (ARANESP) injection - DIALYSIS  100 mcg Intravenous Q Tue-HD  . gabapentin  300 mg Oral QHS  . heparin  5,000 Units Subcutaneous Q8H  . Melatonin  4.5 mg Oral QHS  . pantoprazole  40 mg Oral Daily   Continuous Infusions: . calcium gluconate    . ferric gluconate (FERRLECIT/NULECIT) IV       LOS: 2 days        Aline August, MD [facility] 02/07/2019, 10:40 AM

## 2019-02-07 NOTE — Procedures (Signed)
I was present at this dialysis session. I have reviewed the session itself and made appropriate changes.   TOl HD, goal UF 4L.  BP stable. TDC.    Filed Weights   02/06/19 2123 02/07/19 0333 02/07/19 1200  Weight: (!) 138.4 kg (!) 138.4 kg (!) 143.4 kg    Recent Labs  Lab 02/06/19 0742 02/07/19 0034  NA 131* 129*  K 3.9 3.9  CL 96* 96*  CO2 25 24  GLUCOSE 106* 168*  BUN 40* 27*  CREATININE 7.31* 5.20*  CALCIUM 6.5* 6.8*  PHOS 3.7  --     Recent Labs  Lab 02/05/19 0310 02/06/19 0636 02/07/19 0034  WBC 11.8* 15.2* 14.8*  NEUTROABS 8.3* 11.9* 11.3*  HGB 7.3* 7.5* 7.0*  HCT 23.6* 24.4* 22.9*  MCV 96.3 98.4 96.6  PLT 146* 150 120*    Scheduled Meds: . amLODipine  10 mg Oral Daily  . atorvastatin  40 mg Oral Daily  . calcitRIOL  0.25 mcg Oral Daily  . calcium carbonate  1 tablet Oral BID WC  . Chlorhexidine Gluconate Cloth  6 each Topical Q0600  . Chlorhexidine Gluconate Cloth  6 each Topical Q0600  . citalopram  40 mg Oral Daily  . collagenase   Topical Daily  . darbepoetin (ARANESP) injection - DIALYSIS  100 mcg Intravenous Q Tue-HD  . gabapentin  300 mg Oral QHS  . heparin  5,000 Units Subcutaneous Q8H  . Melatonin  4.5 mg Oral QHS  . pantoprazole  40 mg Oral Daily   Continuous Infusions: . ferric gluconate (FERRLECIT/NULECIT) IV     PRN Meds:.acetaminophen **OR** acetaminophen, albuterol, guaiFENesin-dextromethorphan, hydrOXYzine, traMADol   Pearson Grippe  MD 02/07/2019, 2:56 PM

## 2019-02-07 NOTE — H&P (Signed)
Physical Medicine and Rehabilitation Admission H&P    Chief complaint: Weakness HPI: Benjerman Molinelli. Beckford is a 48 year old right-handed male history of syphilis, anemia of chronic disease, chronic kidney disease with recent dialysis initiated, diabetes mellitus, morbid obesity with BMI of 43.78, hypertension and left BKA 5 years ago in [facility].  Per chart review patient lives with his elderly mother.  Reportedly independent with prosthesis and wheelchair prior to admission.  1 level home.  Mother is limited physically and receives CAPS assistance for herself 5 hours a day.  He has a brother that works nights.  Recent admission 01/30/2019 to 02/03/2019 for uremia with acute kidney injury receiving tunneled dialysis catheter 02/02/2019 and was to begin outpatient hemodialysis at [facility] clinic Tuesday Thursday Saturday.  Patient presented 02/04/2019 with increasing shortness of breath as well as scrotal edema.  Chest x-ray showed mild vascular congestion.  There was noted skin tears of the right lower extremity patient had noted fall since recent discharge home.  Hemoglobin 7.9, creatinine 8.82, calcium 6.3, COVID negative.  Hemodialysis again follow-up and did initially receive an extra treatment of hemodialysis for suspect volume overload.WOC follow-up for wounds to right lower leg with dressing changes as directed.  Subcutaneous heparin for DVT prophylaxis.  Therapy evaluations completed and patient was admitted for a comprehensive rehab program.  Review of Systems  Constitutional: Negative for chills and fever.  HENT: Negative for hearing loss.   Eyes: Negative for blurred vision and double vision.  Respiratory: Positive for shortness of breath. Negative for cough and sputum production.   Cardiovascular: Positive for leg swelling. Negative for chest pain and palpitations.  Gastrointestinal: Positive for constipation. Negative for heartburn, nausea and vomiting.       GERD   Genitourinary: Negative for dysuria, flank pain and hematuria.  Musculoskeletal: Positive for falls and myalgias.  Skin: Negative for rash.  Psychiatric/Behavioral: Positive for depression. The patient has insomnia.   All other systems reviewed and are negative.  Past Medical History:  Diagnosis Date  . Anemia   . Chronic kidney disease    Stage 5  . Depression   . Diabetes mellitus without complication ([place])   . Dyspnea   . GERD (gastroesophageal reflux disease)   . Hypertension    Past Surgical History:  Procedure Laterality Date  . AV FISTULA PLACEMENT Left 01/09/2019   Procedure: ARTERIOVENOUS (AV) FISTULA CREATION LEFT UPPER ARM;  Surgeon: [name], MD;  Location: [facility] OR;  Service: Vascular;  Laterality: Left;  . below the knee amputation Left   . EYE SURGERY    . IR FLUORO GUIDE CV LINE RIGHT  01/31/2019  . IR FLUORO GUIDE CV LINE RIGHT  02/03/2019  . IR US GUIDE VASC ACCESS RIGHT  01/31/2019  . IR US GUIDE VASC ACCESS RIGHT  02/03/2019   History reviewed. No pertinent family history. Social History:  reports that he has quit smoking. He has never used smokeless tobacco. He reports previous alcohol use. He reports previous drug use. Allergies: No Known Allergies Medications Prior to Admission  Medication Sig Dispense Refill  . albuterol (VENTOLIN HFA) 108 (90 Base) MCG/ACT inhaler Inhale into the lungs every 6 (six) hours as needed for wheezing or shortness of breath.    Marland Kitchen amLODipine (NORVASC) 10 MG tablet Take 10 mg by mouth daily.    Marland Kitchen atorvastatin (LIPITOR) 40 MG tablet Take 40 mg by mouth daily.     . calcium carbonate (TUMS - DOSED IN MG ELEMENTAL  CALCIUM) 500 MG chewable tablet Chew 1 tablet by mouth daily as needed for indigestion or heartburn.     . citalopram (CELEXA) 40 MG tablet Take 40 mg by mouth daily.   0  . gabapentin (NEURONTIN) 300 MG capsule Take 300 mg by mouth 3 (three) times daily.   2  . guaiFENesin-dextromethorphan (ROBITUSSIN DM) 100-10 MG/5ML  syrup Take 10 mLs by mouth every 4 (four) hours as needed for cough.    . hydrOXYzine (ATARAX/VISTARIL) 25 MG tablet Take 25 mg by mouth every 8 (eight) hours as needed for anxiety.   0  . Melatonin 5 MG CAPS Take 1 capsule by mouth at bedtime.    Marland Kitchen omeprazole (PRILOSEC) 40 MG capsule Take 40 mg by mouth daily.    . ondansetron (ZOFRAN) 4 MG tablet Take 4 mg by mouth daily as needed for nausea or vomiting.     Marland Kitchen oxyCODONE-acetaminophen (PERCOCET) 5-325 MG tablet Take 1 tablet by mouth every 6 (six) hours as needed for severe pain. 8 tablet 0  . traMADol (ULTRAM) 50 MG tablet Take by mouth every 6 (six) hours as needed.      Drug Regimen Review Drug regimen was reviewed and remains appropriate with no significant issues identified  Home: Home Living Family/patient expects to be discharged to:: Private residence Living Arrangements: Parent Available Help at Discharge: Family Type of Home: House Home Access: Stairs to enter Technical brewer of Steps: 1 Home Layout: Two level, Able to live on main level with bedroom/bathroom Home Equipment: Wheelchair - manual Additional Comments: Has prosthetic but reports it is at home. Reports he has not fit in prosthetic lately secondary to swelling.    Functional History: Prior Function Level of Independence: Independent with assistive device(s) Comments: Pt reports he used WC for primary mobility since swelling started, and was able to transfer independently. Prior to onset of swelling, pt able to ambulate independently with use of prosthetic.   Functional Status:  Mobility: Bed Mobility Overal bed mobility: Needs Assistance Bed Mobility: Supine to Sit, Sit to Supine Rolling: Mod assist Supine to sit: Mod assist Sit to supine: Mod assist General bed mobility comments: Maintained bil hips in abduction throughout movement because of scrotal edema; Cues for technique; initiated by using bed pads to scoot hips to EOB; he performed a kind of  modified bridging to scoot as well; Heavy mod handheld assist to pull to sit, and [place] also used bed rails; cues for hand positioning; mod assist for positioning back in teh bed; he is able to hod to head board and assist in scooting up to [place] transfer comment: Hopeful to try next session      ADL: ADL Overall ADL's : Needs assistance/impaired Eating/Feeding: Set up, Bed level Grooming: Set up, Bed level, Wash/dry hands, Wash/dry face Upper Body Bathing: Set up, Bed level Lower Body Bathing: Maximal assistance, Bed level Lower Body Bathing Details (indicate cue type and reason): unable to tolerate movement of LB for scrotal sling Upper Body Dressing : Set up, Sitting Lower Body Dressing: Maximal assistance, Bed level Lower Body Dressing Details (indicate cue type and reason): max A to don Scrotal sling in supine Toilet Transfer: Total assistance Toileting- Clothing Manipulation and Hygiene: Maximal assistance, +2 for physical assistance, +2 for safety/equipment, Bed level Toileting - Clothing Manipulation Details (indicate cue type and reason): pt reports using bed pain - severe pain Functional mobility during ADLs: Total assistance General ADL Comments: set-upA for UB ADL and severe pain- pt  unable to tolerate movement of LB for scrotal sling  Cognition: Cognition Overall Cognitive Status: Within Functional Limits for tasks assessed Orientation Level: Oriented X4 Cognition Arousal/Alertness: Awake/alert Behavior During Therapy: WFL for tasks assessed/performed Overall Cognitive Status: Within Functional Limits for tasks assessed General Comments: Participating well  Physical Exam: Blood pressure 94/64, pulse 86, temperature 98.5 F (36.9 C), temperature source Oral, resp. rate (!) 21, height 5\' 10"  (1.778 m), weight (!) 139.7 kg, SpO2 99 %. Physical Exam  Constitutional: He is oriented to person, place, and time. No distress.  Morbidly obese  HENT:  Head:  Normocephalic and atraumatic.  Eyes: Pupils are equal, round, and reactive to light. EOM are normal.  Neck: Normal range of motion. No thyromegaly present.  Cardiovascular: Normal rate. Exam reveals no friction rub.  No murmur heard. Respiratory: Effort normal. No respiratory distress. He has no wheezes.  GI: Soft. He exhibits no distension. There is no abdominal tenderness.  Musculoskeletal:     Comments: 1+ RLE edema, decreased edema left stump, shrinker on. Scrotum decr to size of large soft ball in size  Neurological: He is alert and oriented to person, place, and time. No cranial nerve deficit. Coordination normal.  UE 5/5. RLE 2-3/5 prox to 3/5 distal. Decreased LT right foot. Good insight and awareness  Skin: Skin is warm. He is not diaphoretic.  Left BKA healed. Skin wound RLE with borken bullae, one remaining bulla on inner thigh. Some other scattered lesions. Callus on patella, tibia  Psychiatric: He has a normal mood and affect. His behavior is normal. Judgment and thought content normal.    Results for orders placed or performed during the hospital encounter of 02/04/19 (from the past 48 hour(s))  CBC with Differential/Platelet     Status: Abnormal   Collection Time: 02/06/19  6:36 AM  Result Value Ref Range   WBC 15.2 (H) 4.0 - 10.5 K/uL   RBC 2.48 (L) 4.22 - 5.81 MIL/uL   Hemoglobin 7.5 (L) 13.0 - 17.0 g/dL   HCT 24.4 (L) 39.0 - 52.0 %   MCV 98.4 80.0 - 100.0 fL   MCH 30.2 26.0 - 34.0 pg   MCHC 30.7 30.0 - 36.0 g/dL   RDW 12.6 11.5 - 15.5 %   Platelets 150 150 - 400 K/uL   nRBC 0.0 0.0 - 0.2 %   Neutrophils Relative % 79 %   Neutro Abs 11.9 (H) 1.7 - 7.7 K/uL   Lymphocytes Relative 7 %   Lymphs Abs 1.0 0.7 - 4.0 K/uL   Monocytes Relative 9 %   Monocytes Absolute 1.4 (H) 0.1 - 1.0 K/uL   Eosinophils Relative 3 %   Eosinophils Absolute 0.5 0.0 - 0.5 K/uL   Basophils Relative 0 %   Basophils Absolute 0.1 0.0 - 0.1 K/uL   Immature Granulocytes 2 %   Abs Immature  Granulocytes 0.34 (H) 0.00 - 0.07 K/uL    Comment: Performed at [facility], [name] [place], [place] [address]  Renal function panel     Status: Abnormal   Collection Time: 02/06/19  7:42 AM  Result Value Ref Range   Sodium 131 (L) 135 - 145 mmol/L   Potassium 3.9 3.5 - 5.1 mmol/L   Chloride 96 (L) 98 - 111 mmol/L   CO2 25 22 - 32 mmol/L   Glucose, Bld 106 (H) 70 - 99 mg/dL   BUN 40 (H) 6 - 20 mg/dL   Creatinine, Ser 7.31 (H) 0.61 - 1.24 mg/dL  Calcium 6.5 (L) 8.9 - 10.3 mg/dL   Phosphorus 3.7 2.5 - 4.6 mg/dL   Albumin 1.5 (L) 3.5 - 5.0 g/dL   GFR calc non Af Amer 8 (L) >60 mL/min   GFR calc Af Amer 9 (L) >60 mL/min   Anion gap 10 5 - 15    Comment: Performed at [facility] [address] [place], [place] [address]  Glucose, capillary     Status: None   Collection Time: 02/06/19 12:08 PM  Result Value Ref Range   Glucose-Capillary 77 70 - 99 mg/dL  Glucose, capillary     Status: Abnormal   Collection Time: 02/06/19  4:15 PM  Result Value Ref Range   Glucose-Capillary 145 (H) 70 - 99 mg/dL  SARS Coronavirus 2 (CEPHEID - Performed in [place] hospital lab), Hosp Order     Status: None   Collection Time: 02/06/19  6:10 PM   Specimen: Nasopharyngeal Swab  Result Value Ref Range   SARS Coronavirus 2 NEGATIVE NEGATIVE    Comment: (NOTE) If result is NEGATIVE SARS-CoV-2 target nucleic acids are NOT DETECTED. The SARS-CoV-2 RNA is generally detectable in upper and lower  respiratory specimens during the acute phase of infection. The lowest  concentration of SARS-CoV-2 viral copies this assay can detect is 250  copies / mL. A negative result does not preclude SARS-CoV-2 infection  and should not be used as the sole basis for treatment or other  patient management decisions.  A negative result may occur with  improper specimen collection / handling, submission of specimen other  than nasopharyngeal swab, presence of viral mutation(s) within the  areas  targeted by this assay, and inadequate number of viral copies  (<250 copies / mL). A negative result must be combined with clinical  observations, patient history, and epidemiological information. If result is POSITIVE SARS-CoV-2 target nucleic acids are DETECTED. The SARS-CoV-2 RNA is generally detectable in upper and lower  respiratory specimens dur ing the acute phase of infection.  Positive  results are indicative of active infection with SARS-CoV-2.  Clinical  correlation with patient history and other diagnostic information is  necessary to determine patient infection status.  Positive results do  not rule out bacterial infection or co-infection with other viruses. If result is PRESUMPTIVE POSTIVE SARS-CoV-2 nucleic acids MAY BE PRESENT.   A presumptive positive result was obtained on the submitted specimen  and confirmed on repeat testing.  While 2019 novel coronavirus  (SARS-CoV-2) nucleic acids may be present in the submitted sample  additional confirmatory testing may be necessary for epidemiological  and / or clinical management purposes  to differentiate between  SARS-CoV-2 and other Sarbecovirus currently known to infect humans.  If clinically indicated additional testing with an alternate test  methodology [phone]) is advised. The SARS-CoV-2 RNA is generally  detectable in upper and lower respiratory sp ecimens during the acute  phase of infection. The expected result is Negative. Fact Sheet for Patients:  StrictlyIdeas.no Fact Sheet for Healthcare Providers: BankingDealers.co.za This test is not yet approved or cleared by the [place] FDA and has been authorized for detection and/or diagnosis of SARS-CoV-2 by FDA under an Emergency Use Authorization (EUA).  This EUA will remain in effect (meaning this test can be used) for the duration of the COVID-19 declaration under Section 564(b)(1) of the Act, 21 U.S.C. section  360bbb-3(b)(1), unless the authorization is terminated or revoked sooner. Performed at [facility], [place] [address] [place], [place] [address]  Glucose, capillary     Status: Abnormal   Collection Time: 02/06/19  9:23 PM  Result Value Ref Range   Glucose-Capillary 167 (H) 70 - 99 mg/dL  CBC with Differential/Platelet     Status: Abnormal   Collection Time: 02/07/19 12:34 AM  Result Value Ref Range   WBC 14.8 (H) 4.0 - 10.5 K/uL   RBC 2.37 (L) 4.22 - 5.81 MIL/uL   Hemoglobin 7.0 (L) 13.0 - 17.0 g/dL   HCT 22.9 (L) 39.0 - 52.0 %   MCV 96.6 80.0 - 100.0 fL   MCH 29.5 26.0 - 34.0 pg   MCHC 30.6 30.0 - 36.0 g/dL   RDW 12.4 11.5 - 15.5 %   Platelets 120 (L) 150 - 400 K/uL   nRBC 0.0 0.0 - 0.2 %   Neutrophils Relative % 77 %   Neutro Abs 11.3 (H) 1.7 - 7.7 K/uL   Lymphocytes Relative 7 %   Lymphs Abs 1.1 0.7 - 4.0 K/uL   Monocytes Relative 10 %   Monocytes Absolute 1.5 (H) 0.1 - 1.0 K/uL   Eosinophils Relative 4 %   Eosinophils Absolute 0.5 0.0 - 0.5 K/uL   Basophils Relative 0 %   Basophils Absolute 0.1 0.0 - 0.1 K/uL   Immature Granulocytes 2 %   Abs Immature Granulocytes 0.35 (H) 0.00 - 0.07 K/uL    Comment: Performed at [facility], [address], [place], [place] [address]  Basic metabolic panel     Status: Abnormal   Collection Time: 02/07/19 12:34 AM  Result Value Ref Range   Sodium 129 (L) 135 - 145 mmol/L   Potassium 3.9 3.5 - 5.1 mmol/L   Chloride 96 (L) 98 - 111 mmol/L   CO2 24 22 - 32 mmol/L   Glucose, Bld 168 (H) 70 - 99 mg/dL   BUN 27 (H) 6 - 20 mg/dL   Creatinine, Ser 5.20 (H) 0.61 - 1.24 mg/dL   Calcium 6.8 (L) 8.9 - 10.3 mg/dL   GFR calc non Af Amer 12 (L) >60 mL/min   GFR calc Af Amer 14 (L) >60 mL/min   Anion gap 9 5 - 15    Comment: Performed at [facility] [address] [place], [place] [address]  Magnesium     Status: Abnormal   Collection Time: 02/07/19 12:34 AM  Result Value Ref Range   Magnesium 1.6 (L) 1.7 - 2.4 mg/dL     Comment: Performed at [facility] [address] [place], [place] [address]  Glucose, capillary     Status: Abnormal   Collection Time: 02/07/19  6:59 AM  Result Value Ref Range   Glucose-Capillary 114 (H) 70 - 99 mg/dL  Glucose, capillary     Status: Abnormal   Collection Time: 02/07/19 11:39 AM  Result Value Ref Range   Glucose-Capillary 114 (H) 70 - 99 mg/dL  Glucose, capillary     Status: Abnormal   Collection Time: 02/07/19  4:54 PM  Result Value Ref Range   Glucose-Capillary 338 (H) 70 - 99 mg/dL  Glucose, capillary     Status: Abnormal   Collection Time: 02/07/19  9:08 PM  Result Value Ref Range   Glucose-Capillary 160 (H) 70 - 99 mg/dL   No results found.     Medical Problem List and Plan: 1.  Debility secondary to end-stage renal disease/multi-medical.  Hemodialysis recently initiated.  -Patient is beginning CIR therapies today including PT and OT   -will ask  [employer] to assess socket, needs new liner 2.  Antithrombotics: -DVT/anticoagulation: Subcutaneous heparin  -antiplatelet therapy: N/A 3. Pain Management: Neurontin 300 mg nightly, Ultram as needed 4. Mood: Melatonin 4.5 mg nightly, Celexa 40 mg daily  -antipsychotic agents: N/A 5. Neuropsych: This patient is capable of making decisions on his own behalf. 6. Skin/Wound Care:  Scrotal sling  -local care to bullae 7. Fluids/Electrolytes/Nutrition: Routine in and outs with follow-up chemistries 8.  End-stage renal disease.  Status post AV fistula 01/09/2019.  Hemodialysis directed.  Danley Danker Weights   02/09/19 0510  Weight: (!) 145 kg    9.  Diabetes mellitus.  Recent hemoglobin A1c 5.0.  Blood sugar stable and discontinued. 10.  Anemia of chronic disease.  Continue Aranesp  -hgb 7.0 11.  History of left BKA 5 years ago in [facility].  Patient does have a prosthesis followed by [employer]. (see above) 12.  Hypertension.  Norvasc 10 mg daily.  Monitor with increased mobility 13.  Morbid  obesity.  BMI 43.78.  Dietary follow-up 14.  Hyperlipidemia.  Lipitor 15.  History of syphilis.  Treated outpatient with penicillin to the healthcare department recently.  Outpatient follow-up     Elizabeth Sauer 02/08/2019

## 2019-02-07 NOTE — Progress Notes (Signed)
Physical Therapy Treatment Patient Details Name: Katie Moch MRN: [id number] DOB: [birth year] Today's Date: 02/07/2019    History of Present Illness Pt is a 48 y/o male admitted secondary to increased SOB weakness. Found to be in volume overload and in need of HD. PMH includes CKD, DM, HTN, L AV fistula, and s/p L BKA.     PT Comments    Continuing work on functional mobility and activity tolerance;  Able to get up to EOB with moderate assist; Anxious with anticipation of pain/discomfort, but still participating; Hopeful to work on standing next session -- even if it's R single limb stance in prep for transfers, and when his prosthesis arrives;   I contacted [employer] to see if a shrinker can be delivered for Floyd to sleep in to help manage LLE swelling for a better prosthesis fit when his brother brings his in; Contacted Dr. Starla Link for an order (Thanks!)  Follow Up Recommendations  CIR     Equipment Recommendations  None recommended by PT    Recommendations for Other Services       Precautions / Restrictions Precautions Precautions: Fall;Other (comment) Precaution Comments: L BKA; prosthetic is at home     Mobility  Bed Mobility Overal bed mobility: Needs Assistance Bed Mobility: Supine to Sit;Sit to Supine     Supine to sit: Mod assist Sit to supine: Mod assist   General bed mobility comments: Maintained bil hips in abduction throughout movement because of scrotal edema; Cues for technique; initiated by using bed pads to scoot hips to EOB; he performed a kind of modified bridging to scoot as well; Heavy mod handheld assist to pull to sit, and [place] also used bed rails; cues for hand positioning; mod assist for positioning back in teh bed; he is able to hod to head board and assist in scooting up to [facility]  Transfers                 General transfer comment: Hopeful to try next session  Ambulation/Gait                 Stairs              Wheelchair Mobility    Modified Rankin (Stroke Patients Only)       Balance Overall balance assessment: Needs assistance   Sitting balance-Leahy Scale: Poor(approaching Fair) Sitting balance - Comments: Tending to lean back with difficulty flexing hips to lean forward due to scrotal edema and general body habitus; able to reach and support self with bil UE support on seated surface and holding foot board; very uncomfortable, and requested to lay back down Postural control: Posterior lean                                  Cognition Arousal/Alertness: Awake/alert Behavior During Therapy: WFL for tasks assessed/performed Overall Cognitive Status: Within Functional Limits for tasks assessed                                 General Comments: Participating well      Exercises      General Comments General comments (skin integrity, edema, etc.): Took slack out of scrotal sling, and added using a pillow case to support scrotum while moving; Ended session with scrotum elevated on blanket roll inside pillow case; we explored options for positioning in the  bed, and Brayden told me the bed to chair position will work ok for eating      Pertinent Vitals/Pain Pain Assessment: 0-10 Pain Score: 10-Worst pain ever Pain Location: BLE, scrotal area with movement  Pain Descriptors / Indicators: Grimacing;Guarding;Sharp Pain Intervention(s): Monitored during session;Repositioned    Home Living                      Prior Function            PT Goals (current goals can now be found in the care plan section) Acute Rehab PT Goals Patient Stated Goal: "to be able to walk to the bathroom and be able to take care of myself" PT Goal Formulation: With patient Time For Goal Achievement: 02/18/19 Potential to Achieve Goals: Good Progress towards PT goals: Progressing toward goals    Frequency    Min 3X/week      PT Plan Current plan remains  appropriate    Co-evaluation              AM-PAC PT "6 Clicks" Mobility   Outcome Measure  Help needed turning from your back to your side while in a flat bed without using bedrails?: A Lot Help needed moving from lying on your back to sitting on the side of a flat bed without using bedrails?: A Lot Help needed moving to and from a bed to a chair (including a wheelchair)?: A Lot Help needed standing up from a chair using your arms (e.g., wheelchair or bedside chair)?: A Lot Help needed to walk in hospital room?: Total Help needed climbing 3-5 steps with a railing? : Total 6 Click Score: 10    End of Session   Activity Tolerance: Patient limited by pain Patient left: in bed;with call bell/phone within reach;with bed alarm set Nurse Communication: Mobility status PT Visit Diagnosis: Muscle weakness (generalized) (M62.81);Difficulty in walking, not elsewhere classified (R26.2)     Time: 1020-1055 PT Time Calculation (min) (ACUTE ONLY): 35 min  Charges:  $Therapeutic Activity: 23-37 mins                     Roney Marion, PT  Acute Rehabilitation Services Pager [phone] Office [place] 02/07/2019, 11:58 AM

## 2019-02-07 NOTE — Plan of Care (Signed)

## 2019-02-08 ENCOUNTER — Inpatient Hospital Stay (HOSPITAL_COMMUNITY)
Admission: RE | Admit: 2019-02-08 | Discharge: 2019-03-14 | DRG: 939 | Disposition: A | Discharge status: Skilled Nursing Facility | Payer: Medicare Other | Source: Intra-hospital | Attending: Physical Medicine & Rehabilitation | Admitting: Physical Medicine & Rehabilitation

## 2019-02-08 ENCOUNTER — Encounter (HOSPITAL_COMMUNITY): Payer: Medicare Other

## 2019-02-08 ENCOUNTER — Other Ambulatory Visit: Payer: Self-pay

## 2019-02-08 ENCOUNTER — Inpatient Hospital Stay (HOSPITAL_COMMUNITY): Payer: Medicare Other

## 2019-02-08 DIAGNOSIS — R5381 Other malaise: Secondary | ICD-10-CM | POA: Diagnosis not present

## 2019-02-08 DIAGNOSIS — B952 Enterococcus as the cause of diseases classified elsewhere: Secondary | ICD-10-CM | POA: Diagnosis not present

## 2019-02-08 DIAGNOSIS — I10 Essential (primary) hypertension: Secondary | ICD-10-CM | POA: Diagnosis not present

## 2019-02-08 DIAGNOSIS — R339 Retention of urine, unspecified: Secondary | ICD-10-CM | POA: Diagnosis not present

## 2019-02-08 DIAGNOSIS — R609 Edema, unspecified: Secondary | ICD-10-CM | POA: Diagnosis not present

## 2019-02-08 DIAGNOSIS — E1122 Type 2 diabetes mellitus with diabetic chronic kidney disease: Secondary | ICD-10-CM | POA: Diagnosis not present

## 2019-02-08 DIAGNOSIS — I96 Gangrene, not elsewhere classified: Secondary | ICD-10-CM | POA: Diagnosis not present

## 2019-02-08 DIAGNOSIS — R0602 Shortness of breath: Secondary | ICD-10-CM | POA: Diagnosis not present

## 2019-02-08 DIAGNOSIS — E1152 Type 2 diabetes mellitus with diabetic peripheral angiopathy with gangrene: Secondary | ICD-10-CM | POA: Diagnosis not present

## 2019-02-08 DIAGNOSIS — Z992 Dependence on renal dialysis: Secondary | ICD-10-CM

## 2019-02-08 DIAGNOSIS — A530 Latent syphilis, unspecified as early or late: Secondary | ICD-10-CM | POA: Diagnosis present

## 2019-02-08 DIAGNOSIS — E785 Hyperlipidemia, unspecified: Secondary | ICD-10-CM | POA: Diagnosis present

## 2019-02-08 DIAGNOSIS — L97911 Non-pressure chronic ulcer of unspecified part of right lower leg limited to breakdown of skin: Secondary | ICD-10-CM | POA: Diagnosis present

## 2019-02-08 DIAGNOSIS — R34 Anuria and oliguria: Secondary | ICD-10-CM | POA: Diagnosis not present

## 2019-02-08 DIAGNOSIS — R338 Other retention of urine: Secondary | ICD-10-CM | POA: Diagnosis not present

## 2019-02-08 DIAGNOSIS — Z1159 Encounter for screening for other viral diseases: Secondary | ICD-10-CM | POA: Diagnosis not present

## 2019-02-08 DIAGNOSIS — E1165 Type 2 diabetes mellitus with hyperglycemia: Secondary | ICD-10-CM | POA: Diagnosis present

## 2019-02-08 DIAGNOSIS — Z87891 Personal history of nicotine dependence: Secondary | ICD-10-CM

## 2019-02-08 DIAGNOSIS — R0989 Other specified symptoms and signs involving the circulatory and respiratory systems: Secondary | ICD-10-CM

## 2019-02-08 DIAGNOSIS — R05 Cough: Secondary | ICD-10-CM

## 2019-02-08 DIAGNOSIS — M2550 Pain in unspecified joint: Secondary | ICD-10-CM | POA: Diagnosis not present

## 2019-02-08 DIAGNOSIS — G4730 Sleep apnea, unspecified: Secondary | ICD-10-CM | POA: Diagnosis not present

## 2019-02-08 DIAGNOSIS — E8770 Fluid overload, unspecified: Secondary | ICD-10-CM | POA: Diagnosis not present

## 2019-02-08 DIAGNOSIS — R238 Other skin changes: Secondary | ICD-10-CM | POA: Diagnosis not present

## 2019-02-08 DIAGNOSIS — G8929 Other chronic pain: Secondary | ICD-10-CM | POA: Diagnosis present

## 2019-02-08 DIAGNOSIS — E11622 Type 2 diabetes mellitus with other skin ulcer: Secondary | ICD-10-CM | POA: Diagnosis present

## 2019-02-08 DIAGNOSIS — Z7401 Bed confinement status: Secondary | ICD-10-CM | POA: Diagnosis not present

## 2019-02-08 DIAGNOSIS — N320 Bladder-neck obstruction: Secondary | ICD-10-CM | POA: Diagnosis not present

## 2019-02-08 DIAGNOSIS — E1129 Type 2 diabetes mellitus with other diabetic kidney complication: Secondary | ICD-10-CM | POA: Diagnosis not present

## 2019-02-08 DIAGNOSIS — K5900 Constipation, unspecified: Secondary | ICD-10-CM | POA: Diagnosis not present

## 2019-02-08 DIAGNOSIS — E871 Hypo-osmolality and hyponatremia: Secondary | ICD-10-CM | POA: Diagnosis not present

## 2019-02-08 DIAGNOSIS — K8020 Calculus of gallbladder without cholecystitis without obstruction: Secondary | ICD-10-CM | POA: Diagnosis not present

## 2019-02-08 DIAGNOSIS — L299 Pruritus, unspecified: Secondary | ICD-10-CM | POA: Diagnosis not present

## 2019-02-08 DIAGNOSIS — G479 Sleep disorder, unspecified: Secondary | ICD-10-CM | POA: Diagnosis not present

## 2019-02-08 DIAGNOSIS — D631 Anemia in chronic kidney disease: Secondary | ICD-10-CM | POA: Diagnosis not present

## 2019-02-08 DIAGNOSIS — G4733 Obstructive sleep apnea (adult) (pediatric): Secondary | ICD-10-CM | POA: Diagnosis not present

## 2019-02-08 DIAGNOSIS — L989 Disorder of the skin and subcutaneous tissue, unspecified: Secondary | ICD-10-CM | POA: Diagnosis not present

## 2019-02-08 DIAGNOSIS — R601 Generalized edema: Secondary | ICD-10-CM | POA: Diagnosis not present

## 2019-02-08 DIAGNOSIS — N471 Phimosis: Secondary | ICD-10-CM | POA: Diagnosis present

## 2019-02-08 DIAGNOSIS — K219 Gastro-esophageal reflux disease without esophagitis: Secondary | ICD-10-CM | POA: Diagnosis not present

## 2019-02-08 DIAGNOSIS — Z6841 Body Mass Index (BMI) 40.0 and over, adult: Secondary | ICD-10-CM

## 2019-02-08 DIAGNOSIS — R262 Difficulty in walking, not elsewhere classified: Secondary | ICD-10-CM | POA: Diagnosis not present

## 2019-02-08 DIAGNOSIS — E669 Obesity, unspecified: Secondary | ICD-10-CM | POA: Diagnosis not present

## 2019-02-08 DIAGNOSIS — I120 Hypertensive chronic kidney disease with stage 5 chronic kidney disease or end stage renal disease: Secondary | ICD-10-CM | POA: Diagnosis not present

## 2019-02-08 DIAGNOSIS — N186 End stage renal disease: Secondary | ICD-10-CM

## 2019-02-08 DIAGNOSIS — N390 Urinary tract infection, site not specified: Secondary | ICD-10-CM | POA: Diagnosis not present

## 2019-02-08 DIAGNOSIS — M6281 Muscle weakness (generalized): Secondary | ICD-10-CM | POA: Diagnosis not present

## 2019-02-08 DIAGNOSIS — D638 Anemia in other chronic diseases classified elsewhere: Secondary | ICD-10-CM | POA: Diagnosis not present

## 2019-02-08 DIAGNOSIS — Z89512 Acquired absence of left leg below knee: Secondary | ICD-10-CM | POA: Diagnosis not present

## 2019-02-08 DIAGNOSIS — E872 Acidosis: Secondary | ICD-10-CM | POA: Diagnosis not present

## 2019-02-08 DIAGNOSIS — D539 Nutritional anemia, unspecified: Secondary | ICD-10-CM

## 2019-02-08 DIAGNOSIS — N2581 Secondary hyperparathyroidism of renal origin: Secondary | ICD-10-CM | POA: Diagnosis present

## 2019-02-08 DIAGNOSIS — R531 Weakness: Secondary | ICD-10-CM | POA: Diagnosis not present

## 2019-02-08 DIAGNOSIS — R279 Unspecified lack of coordination: Secondary | ICD-10-CM | POA: Diagnosis not present

## 2019-02-08 DIAGNOSIS — N185 Chronic kidney disease, stage 5: Secondary | ICD-10-CM | POA: Diagnosis not present

## 2019-02-08 DIAGNOSIS — R278 Other lack of coordination: Secondary | ICD-10-CM | POA: Diagnosis not present

## 2019-02-08 DIAGNOSIS — E875 Hyperkalemia: Secondary | ICD-10-CM | POA: Diagnosis not present

## 2019-02-08 DIAGNOSIS — N5089 Other specified disorders of the male genital organs: Secondary | ICD-10-CM | POA: Diagnosis not present

## 2019-02-08 DIAGNOSIS — N5082 Scrotal pain: Secondary | ICD-10-CM | POA: Diagnosis not present

## 2019-02-08 DIAGNOSIS — K5901 Slow transit constipation: Secondary | ICD-10-CM | POA: Diagnosis not present

## 2019-02-08 DIAGNOSIS — R059 Cough, unspecified: Secondary | ICD-10-CM

## 2019-02-08 DIAGNOSIS — F329 Major depressive disorder, single episode, unspecified: Secondary | ICD-10-CM | POA: Diagnosis present

## 2019-02-08 DIAGNOSIS — R269 Unspecified abnormalities of gait and mobility: Secondary | ICD-10-CM | POA: Diagnosis not present

## 2019-02-08 DIAGNOSIS — D72829 Elevated white blood cell count, unspecified: Secondary | ICD-10-CM | POA: Diagnosis not present

## 2019-02-08 DIAGNOSIS — I70232 Atherosclerosis of native arteries of right leg with ulceration of calf: Secondary | ICD-10-CM | POA: Diagnosis not present

## 2019-02-08 DIAGNOSIS — A539 Syphilis, unspecified: Secondary | ICD-10-CM

## 2019-02-08 DIAGNOSIS — E1169 Type 2 diabetes mellitus with other specified complication: Secondary | ICD-10-CM | POA: Diagnosis not present

## 2019-02-08 LAB — CBC WITH DIFFERENTIAL/PLATELET
Abs Immature Granulocytes: 0.41 10*3/uL — ABNORMAL HIGH (ref 0.00–0.07)
Basophils Absolute: 0.1 10*3/uL (ref 0.0–0.1)
Basophils Relative: 0 %
Eosinophils Absolute: 0.6 10*3/uL — ABNORMAL HIGH (ref 0.0–0.5)
Eosinophils Relative: 5 %
HCT: 22.8 % — ABNORMAL LOW (ref 39.0–52.0)
Hemoglobin: 7 g/dL — ABNORMAL LOW (ref 13.0–17.0)
Immature Granulocytes: 3 %
Lymphocytes Relative: 11 %
Lymphs Abs: 1.5 10*3/uL (ref 0.7–4.0)
MCH: 30.3 pg (ref 26.0–34.0)
MCHC: 30.7 g/dL (ref 30.0–36.0)
MCV: 98.7 fL (ref 80.0–100.0)
Monocytes Absolute: 1.7 10*3/uL — ABNORMAL HIGH (ref 0.1–1.0)
Monocytes Relative: 12 %
Neutro Abs: 9.7 10*3/uL — ABNORMAL HIGH (ref 1.7–7.7)
Neutrophils Relative %: 69 %
Platelets: 138 10*3/uL — ABNORMAL LOW (ref 150–400)
RBC: 2.31 MIL/uL — ABNORMAL LOW (ref 4.22–5.81)
RDW: 12.4 % (ref 11.5–15.5)
WBC: 14 10*3/uL — ABNORMAL HIGH (ref 4.0–10.5)
nRBC: 0 % (ref 0.0–0.2)

## 2019-02-08 LAB — BASIC METABOLIC PANEL
Anion gap: 9 (ref 5–15)
BUN: 22 mg/dL — ABNORMAL HIGH (ref 6–20)
CO2: 24 mmol/L (ref 22–32)
Calcium: 7.1 mg/dL — ABNORMAL LOW (ref 8.9–10.3)
Chloride: 97 mmol/L — ABNORMAL LOW (ref 98–111)
Creatinine, Ser: 4.48 mg/dL — ABNORMAL HIGH (ref 0.61–1.24)
GFR calc Af Amer: 17 mL/min — ABNORMAL LOW (ref >60)
GFR calc non Af Amer: 15 mL/min — ABNORMAL LOW (ref >60)
Glucose, Bld: 137 mg/dL — ABNORMAL HIGH (ref 70–99)
Potassium: 4 mmol/L (ref 3.5–5.1)
Sodium: 130 mmol/L — ABNORMAL LOW (ref 135–145)

## 2019-02-08 LAB — GLUCOSE, CAPILLARY
Glucose-Capillary: 116 mg/dL — ABNORMAL HIGH (ref 70–99)
Glucose-Capillary: 132 mg/dL — ABNORMAL HIGH (ref 70–99)
Glucose-Capillary: 147 mg/dL — ABNORMAL HIGH (ref 70–99)

## 2019-02-08 LAB — MAGNESIUM: Magnesium: 1.6 mg/dL — ABNORMAL LOW (ref 1.7–2.4)

## 2019-02-08 MED ORDER — CHLORHEXIDINE GLUCONATE CLOTH 2 % EX PADS: 6.0000 | MEDICATED_PAD | Freq: Every day | CUTANEOUS | Status: DC

## 2019-02-08 MED ORDER — HEPARIN SODIUM (PORCINE) 5000 UNIT/ML IJ SOLN: 5000.0000 [IU] | Freq: Three times a day (TID) | INTRAMUSCULAR | Status: DC

## 2019-02-08 MED ORDER — CALCIUM CARBONATE ANTACID 500 MG PO CHEW: 1.0000 | CHEWABLE_TABLET | Freq: Two times a day (BID) | ORAL | Status: DC

## 2019-02-08 MED ORDER — PANTOPRAZOLE SODIUM 40 MG PO TBEC: 40.0000 mg | DELAYED_RELEASE_TABLET | Freq: Every day | ORAL | Status: DC

## 2019-02-08 MED ORDER — DARBEPOETIN ALFA 100 MCG/0.5ML IJ SOSY: 100.0000 ug | PREFILLED_SYRINGE | INTRAMUSCULAR | Status: DC

## 2019-02-08 MED ORDER — SENNOSIDES-DOCUSATE SODIUM 8.6-50 MG PO TABS
1.0000 | ORAL_TABLET | Freq: Two times a day (BID) | ORAL | Status: DC
  Administered 2019-02-08: 1 via ORAL
  Filled 2019-02-08: qty 1

## 2019-02-08 MED ORDER — MELATONIN 3 MG PO TABS: 4.5000 mg | ORAL_TABLET | Freq: Every day | ORAL | 0 refills | Status: DC

## 2019-02-08 MED ORDER — INSULIN ASPART 100 UNIT/ML ~~LOC~~ SOLN: 0.0000 [IU] | Freq: Every day | SUBCUTANEOUS | Status: DC

## 2019-02-08 MED ORDER — POLYETHYLENE GLYCOL 3350 17 G PO PACK: 17.0000 g | PACK | Freq: Every day | ORAL | 0 refills | Status: DC

## 2019-02-08 MED ORDER — SENNOSIDES-DOCUSATE SODIUM 8.6-50 MG PO TABS: 1.0000 | ORAL_TABLET | Freq: Two times a day (BID) | ORAL | Status: DC

## 2019-02-08 MED ORDER — COLLAGENASE 250 UNIT/GM EX OINT: TOPICAL_OINTMENT | Freq: Every day | CUTANEOUS | 0 refills | Status: DC

## 2019-02-08 MED ORDER — SODIUM CHLORIDE 0.9 % IV SOLN: 62.5000 mg | INTRAVENOUS | Status: DC

## 2019-02-08 MED ORDER — MAGNESIUM SULFATE 2 GM/50ML IV SOLN
2.0000 g | Freq: Once | INTRAVENOUS | Status: AC
  Administered 2019-02-08: 2 g via INTRAVENOUS
  Filled 2019-02-08: qty 50

## 2019-02-08 MED ORDER — INSULIN ASPART 100 UNIT/ML ~~LOC~~ SOLN: 0.0000 [IU] | Freq: Three times a day (TID) | SUBCUTANEOUS | Status: DC

## 2019-02-08 MED ORDER — GABAPENTIN 300 MG PO CAPS: 300.0000 mg | ORAL_CAPSULE | Freq: Every day | ORAL | Status: DC

## 2019-02-08 MED ORDER — CALCITRIOL 0.25 MCG PO CAPS: 0.2500 ug | ORAL_CAPSULE | Freq: Every day | ORAL | Status: DC

## 2019-02-08 MED ORDER — POLYETHYLENE GLYCOL 3350 17 G PO PACK
17.0000 g | PACK | Freq: Every day | ORAL | Status: DC
  Administered 2019-02-08: 17 g via ORAL
  Filled 2019-02-08: qty 1

## 2019-02-08 MED ORDER — ALBUTEROL SULFATE (2.5 MG/3ML) 0.083% IN NEBU: 3.0000 mL | INHALATION_SOLUTION | Freq: Four times a day (QID) | RESPIRATORY_TRACT | 12 refills | Status: DC | PRN

## 2019-02-08 NOTE — Plan of Care (Signed)

## 2019-02-08 NOTE — Progress Notes (Addendum)
[facility]  HPI: 48 y/o male with ESRD s/p left radio basilic fistula creation 6/ 22/20.    Subjective  - He denise symptoms of steal without weakness, loss of sensation or pain.      Objective 110/69 82 99.6 F (37.6 C) (Oral) 18 95%  Intake/Output Summary (Last 24 hours) at 02/08/2019 0958 Last data filed at 02/08/2019 0900 Gross per 24 hour  Intake 810 ml  Output 4000 ml  Net -3190 ml    Palpable Left radial pulse and palpable thrill in fistula Left UE motor and sensation intact Gen NAD Lungs with mild labored breathing when talking  Assessment/Planning: S/P fistula stage basilic fistula creations. We will order a fistula duplex and plan seconds stage basilic procedure when he has stabilization of his COPD/respiratory issues possibly this hospitalization.    [name] 02/08/2019 9:58 AM --  Laboratory Lab Results: Recent Labs    02/07/19 0034 02/08/19 0622  WBC 14.8* 14.0*  HGB 7.0* 7.0*  HCT 22.9* 22.8*  PLT 120* 138*   BMET Recent Labs    02/07/19 0034 02/08/19 0622  NA 129* 130*  K 3.9 4.0  CL 96* 97*  CO2 24 24  GLUCOSE 168* 137*  BUN 27* 22*  CREATININE 5.20* 4.48*  CALCIUM 6.8* 7.1*    COAG Lab Results  Component Value Date   INR 1.4 (H) 01/31/2019   No results found for: PTT  I have examined the patient, reviewed and agree with above.  The patient was to see me as an outpatient yesterday in the office but had been admitted with volume overload.  He does have an excellent thrill in his brachiobasilic fistula.  Will obtain duplex of the fistula.  Will need second stage basilic vein transposition.  Can be done as an outpatient or scheduled while inpatient as he stabilized.  Will follow with you.  [name], MD 02/08/2019 10:23 AM

## 2019-02-08 NOTE — Progress Notes (Signed)
Admit: 02/04/2019 LOS: 3  74M recent start ESRD (at [facility]) admitted with hypervolemia, poor physical stamina  Subjective:  . HD yesterday, 4L UF, . Feels better . Possibly to CIR . Edema still present  07/21 0701 - 07/22 0700 In: 390 [P.O.:240; IV Piggyback:150] Out: 4000   Filed Weights   02/07/19 1608 02/07/19 2108 02/08/19 0500  Weight: (!) 139.7 kg (!) 139.7 kg (!) 139.7 kg    Scheduled Meds: . amLODipine  10 mg Oral Daily  . atorvastatin  40 mg Oral Daily  . calcitRIOL  0.25 mcg Oral Daily  . calcium carbonate  1 tablet Oral BID WC  . Chlorhexidine Gluconate Cloth  6 each Topical Q0600  . Chlorhexidine Gluconate Cloth  6 each Topical Q0600  . citalopram  40 mg Oral Daily  . collagenase   Topical Daily  . darbepoetin (ARANESP) injection - DIALYSIS  100 mcg Intravenous Q Tue-HD  . gabapentin  300 mg Oral QHS  . heparin  5,000 Units Subcutaneous Q8H  . Melatonin  4.5 mg Oral QHS  . pantoprazole  40 mg Oral Daily  . polyethylene glycol  17 g Oral Daily  . senna-docusate  1 tablet Oral BID   Continuous Infusions: . ferric gluconate (FERRLECIT/NULECIT) IV 62.5 mg (02/07/19 1500)  . magnesium sulfate bolus IVPB 2 g (02/08/19 1121)   PRN Meds:.acetaminophen **OR** acetaminophen, albuterol, guaiFENesin-dextromethorphan, hydrOXYzine, traMADol  Current Labs: reviewed   Physical Exam:  Blood pressure 110/69, pulse 82, temperature 99.6 F (37.6 C), temperature source Oral, resp. rate 18, height 5\' 10"  (1.778 m), weight (!) 139.7 kg, SpO2 95 %. Pleasant nad RRR nl s1s2 L 1st stage BC AVF +B/T 3+ LEE, pitting in RLE, stump swollen CTAB Nonfocal, AAO x3 EOMI  A 1. ESRD, new, for [facility] upon DC 2. HTN / Vol: sig edema, cont to push down post HD weights 3. S/p L BC AVF, will need 2nd stag,e using TDC 4. Ambulatory dysfunction, PT/OT, ?CIR 5. CKD BMD: cont C3, PTH ok 6. Anemia: aranesp 100 qTues, CTM  P . HD tomorrow on schedule: 5L UF, TDC, 4h, 2K, Tight  heparin . Medication Issues; o Preferred narcotic agents for pain control are hydromorphone, fentanyl, and methadone. Morphine should not be used.  o Baclofen should be avoided o Avoid oral sodium phosphate and magnesium citrate based laxatives / bowel preps    Pearson Grippe MD 02/08/2019, 11:25 AM  Recent Labs  Lab 02/02/19 0322 02/03/19 0342  02/06/19 0742 02/07/19 0034 02/08/19 0622  NA 137 134*   < > 131* 129* 130*  K 4.1 4.0   < > 3.9 3.9 4.0  CL 102 100   < > 96* 96* 97*  CO2 20* 24   < > 25 24 24   GLUCOSE 116* 126*   < > 106* 168* 137*  BUN 84* 50*   < > 40* 27* 22*  CREATININE 11.77* 8.04*   < > 7.31* 5.20* 4.48*  CALCIUM 6.1* 6.4*   < > 6.5* 6.8* 7.1*  PHOS 6.1* 4.2  --  3.7  --   --    < > = values in this interval not displayed.   Recent Labs  Lab 02/06/19 0636 02/07/19 0034 02/08/19 0622  WBC 15.2* 14.8* 14.0*  NEUTROABS 11.9* 11.3* 9.7*  HGB 7.5* 7.0* 7.0*  HCT 24.4* 22.9* 22.8*  MCV 98.4 96.6 98.7  PLT 150 120* 138*

## 2019-02-08 NOTE — Care Management Important Message (Signed)
Important Message  Patient Details  Name: Michael Riggs MRN: [id number] Date of Birth: [birth year]   Medicare Important Message Given:  Yes     Orbie Pyo 02/08/2019, 1:13 PM

## 2019-02-08 NOTE — Discharge Summary (Signed)
Discharge Summary  Sullivan Jacuinde TKW:[id number] DOB: [birth year]  PCP: [name], MD  Admit date: 02/04/2019 Discharge date: 02/08/2019  Time spent: 40 mins  Recommendations for Outpatient Follow-up:  1. PCP upon discharge 2. Nephrology upon d/c  Discharge Diagnoses:  Active Hospital Problems   Diagnosis Date Noted  . Fluid overload 02/04/2019  . Macrocytic anemia 01/30/2019  . Syphilis 01/30/2019  . ESRD (end stage renal disease) (Early)   . Diabetes mellitus type 2 in obese [facility]) 07/04/2013    Resolved Hospital Problems  No resolved problems to display.    Discharge Condition: Stable  Diet recommendation: Renal diet  Vitals:   02/08/19 0901 02/08/19 1628  BP: 110/69 119/80  Pulse: 82 84  Resp: 18 18  Temp: 99.6 F (37.6 C) 98.8 F (37.1 C)  SpO2: 95% 100%    History of present illness:  48 year old male with history of chronic renal disease who was recently admitted for acute metabolic encephalopathy in the setting of uremia, was started on dialysis and discharged home on 02/03/2019 presented on 02/03/2021 [facility] for shortness of breath and weakness and was transferred to [facility] for the need for requiring dialysis.  He was found to have mild vascular congestion on chest x-ray.  Initial COVID-19 testing was negative at [facility].  Nephrology was consulted.  PT recommended CIR. Patient accepted at [facility].   Today, patient denies any new complaints.  Stable to be transferred to CIR for further rehab needs.  Hospital Course:  Principal Problem:   Fluid overload Active Problems:   Diabetes mellitus type 2 in obese (HCC)   Syphilis   Macrocytic anemia   ESRD (end stage renal disease) (HCC)  End-stage renal disease recently started on dialysis Anasarca along with volume overload -Nephrology following.  Dialysis as per nephrology schedule.  Hypocalcemia/hyponatremia/hypomagnesemia -Nephrology on board, replace PRN -Daily  BMP  Generalized deconditioning with ambulatory dysfunction -Transfer to CIR  Hypertension -Continue amlodipine.  Blood pressure stable  Hyperlipidemia -Continue statin  Diabetes mellitus type 2 -Recent hemoglobin was 5 Continue CBGs with SSI  Leukocytosis Probably reactive. Monitor closely  Multiple skin tears and ulceration of the right lower extremity Wound care as per wound care recommendations.  Anemia of chronic kidney disease hemoglobin stable.  Monitor.  Transfuse if hemoglobin is less than 7 Type and screen pending  History of syphilis -Treated as an outpatient with penicillin at health department recently.  Outpatient follow-up.  Morbid obesity  -Lifestyle modification advised  -outpatient follow-up         Malnutrition Type:      Malnutrition Characteristics:      Nutrition Interventions:      Estimated body mass index is 44.19 kg/m as calculated from the following:   Height as of this encounter: 5\' 10"  (1.778 m).   Weight as of this encounter: 139.7 kg.    Procedures:  None  Consultations:  Nephrology  Vascular surgery  Discharge Exam: BP 119/80 (BP Location: Right Arm)   Pulse 84   Temp 98.8 F (37.1 C) (Oral)   Resp 18   Ht 5\' 10"  (1.778 m)   Wt (!) 139.7 kg   SpO2 100%   BMI 44.19 kg/m   General: NAD Cardiovascular: S1, S2 present Respiratory: Decreased breath sounds bilaterally with some bibasilar crackles  Discharge Instructions You were cared for by a hospitalist during your hospital stay. If you have any questions about your discharge medications or the care you received while you were in the  hospital after you are discharged, you can call the unit and asked to speak with the hospitalist on call if the hospitalist that took care of you is not available. Once you are discharged, your primary care physician will handle any further medical issues. Please note that NO REFILLS for any discharge medications  will be authorized once you are discharged, as it is imperative that you return to your primary care physician (or establish a relationship with a primary care physician if you do not have one) for your aftercare needs so that they can reassess your need for medications and monitor your lab values.   Allergies as of 02/08/2019   No Known Allergies     Medication List    STOP taking these medications   albuterol 108 (90 Base) MCG/ACT inhaler Commonly known as: VENTOLIN HFA Replaced by: albuterol (2.5 MG/3ML) 0.083% nebulizer solution   guaiFENesin-dextromethorphan 100-10 MG/5ML syrup Commonly known as: ROBITUSSIN DM   Melatonin 5 MG Caps Replaced by: Melatonin 3 MG Tabs   omeprazole 40 MG capsule Commonly known as: PRILOSEC Replaced by: pantoprazole 40 MG tablet   oxyCODONE-acetaminophen 5-325 MG tablet Commonly known as: Percocet     TAKE these medications   albuterol (2.5 MG/3ML) 0.083% nebulizer solution Commonly known as: PROVENTIL Inhale 3 mLs into the lungs every 6 (six) hours as needed for wheezing or shortness of breath. Replaces: albuterol 108 (90 Base) MCG/ACT inhaler   amLODipine 10 MG tablet Commonly known as: NORVASC Take 10 mg by mouth daily.   atorvastatin 40 MG tablet Commonly known as: LIPITOR Take 40 mg by mouth daily.   calcitRIOL 0.25 MCG capsule Commonly known as: ROCALTROL Take 1 capsule (0.25 mcg total) by mouth daily. Start taking on: February 09, 2019   calcium carbonate 500 MG chewable tablet Commonly known as: TUMS - dosed in mg elemental calcium Chew 1 tablet (200 mg of elemental calcium total) by mouth 2 (two) times daily with a meal. Start taking on: February 09, 2019 What changed:   when to take this  reasons to take this   Chlorhexidine Gluconate Cloth 2 % Pads Apply 6 each topically daily at 6 (six) AM. Start taking on: February 09, 2019   citalopram 40 MG tablet Commonly known as: CELEXA Take 40 mg by mouth daily.   collagenase  ointment Commonly known as: SANTYL Apply topically daily. Start taking on: February 09, 2019   Darbepoetin Alfa 100 MCG/0.5ML Sosy injection Commonly known as: ARANESP Inject 0.5 mLs (100 mcg total) into the vein every Tuesday with hemodialysis. Start taking on: February 14, 2019   ferric gluconate 62.5 mg in sodium chloride 0.9 % 100 mL Inject 62.5 mg into the vein Every Tuesday,Thursday,and Saturday with dialysis. Start taking on: February 09, 2019   gabapentin 300 MG capsule Commonly known as: NEURONTIN Take 1 capsule (300 mg total) by mouth at bedtime. What changed: when to take this   heparin 5000 UNIT/ML injection Inject 1 mL (5,000 Units total) into the skin every 8 (eight) hours.   hydrOXYzine 25 MG tablet Commonly known as: ATARAX/VISTARIL Take 25 mg by mouth every 8 (eight) hours as needed for anxiety.   insulin aspart 100 UNIT/ML injection Commonly known as: novoLOG Inject 0-5 Units into the skin at bedtime.   insulin aspart 100 UNIT/ML injection Commonly known as: novoLOG Inject 0-9 Units into the skin 3 (three) times daily with meals. Start taking on: February 09, 2019   Melatonin 3 MG Tabs Take 1.5 tablets (4.5  mg total) by mouth at bedtime. Replaces: Melatonin 5 MG Caps   ondansetron 4 MG tablet Commonly known as: ZOFRAN Take 4 mg by mouth daily as needed for nausea or vomiting.   pantoprazole 40 MG tablet Commonly known as: PROTONIX Take 1 tablet (40 mg total) by mouth daily. Start taking on: February 09, 2019 Replaces: omeprazole 40 MG capsule   polyethylene glycol 17 g packet Commonly known as: MIRALAX / GLYCOLAX Take 17 g by mouth daily. Start taking on: February 09, 2019   senna-docusate 8.6-50 MG tablet Commonly known as: Senokot-S Take 1 tablet by mouth 2 (two) times daily.   traMADol 50 MG tablet Commonly known as: ULTRAM Take by mouth every 6 (six) hours as needed.      No Known Allergies    The results of significant diagnostics from this  hospitalization (including imaging, microbiology, ancillary and laboratory) are listed below for reference.    Significant Diagnostic Studies: US Renal  Result Date: 01/31/2019 CLINICAL DATA:  Acute renal failure. History of diabetes, hypertension, chronic kidney disease. EXAM: RENAL / URINARY TRACT ULTRASOUND COMPLETE COMPARISON:  None. FINDINGS: Right Kidney: Renal measurements: 10.7 x 6.2 x 6.2 cm = volume: 214 mL. Suboptimal visualization. Echogenicity within normal limits. No mass or hydronephrosis visualized. Left Kidney: Renal measurements: 11.7 x 7.3 x 5.8 cm = volume: 256 mL. Suboptimal visualization. Echogenicity within normal limits. No mass or hydronephrosis visualized. Bladder: Appears normal for degree of bladder distention. IMPRESSION: Suboptimal visualization of both kidneys. No gross abnormality. No hydronephrosis. Electronically Signed   By: Franki Cabot M.D.   On: 01/31/2019 15:59   Ir Fluoro Guide Cv Line Right  Result Date: 02/03/2019 CLINICAL DATA:  Renal failure needs durable venous access for hemodialysis. Temporary IJ catheter in place. EXAM: TUNNELED HEMODIALYSIS CATHETER PLACEMENT WITH ULTRASOUND AND FLUOROSCOPIC GUIDANCE TECHNIQUE: The procedure, risks, benefits, and alternatives were explained to the patient. Questions regarding the procedure were encouraged and answered. The patient understands and consents to the procedure. As antibiotic prophylaxis, cefazolin 2 g was ordered pre-procedure and administered intravenously within one hour of incision. Previously placed right IJ catheter and surrounding skin prepped using maximum barrier technique including cap and mask, sterile gown, sterile gloves, large sterile sheet, and Chlorhexidine as cutaneous antisepsis. The region was infiltrated locally with 1% lidocaine. Intravenous Fentanyl 53mcg and Versed 2mg  were administered as conscious sedation during continuous monitoring of the patient's level of consciousness and  physiological / cardiorespiratory status by the radiology RN, with a total moderate sedation time of 15 minutes. A Bentson wire was advanced through the catheter into the IVC, exchanged for an MPA catheter . A Palindrome 23 hemodialysis catheter was tunneled from the right anterior chest wall approach to the right IJ dermatotomy site. The MPA catheter was exchanged over an Amplatz wire for serial vascular dilators which allow placement of a peel-away sheath, through which the catheter was advanced under intermittent fluoroscopy, positioned with its tips at the cavoatrial junction. Spot chest radiograph confirms good catheter position. No pneumothorax. Catheter was flushed and primed per protocol. Catheter secured externally with O Prolene sutures. COMPLICATIONS: COMPLICATIONS None immediate FLUOROSCOPY TIME:  1 minutes; 257  uGym2 DAP COMPARISON:  None IMPRESSION: 1. Technically successful placement of tunneled right IJ hemodialysis catheter with ultrasound and fluoroscopic guidance. Ready for routine use. ACCESS: Remains approachable for percutaneous intervention as needed. Electronically Signed   By: Lucrezia Europe M.D.   On: 02/03/2019 10:34   Ir Fluoro Guide Cv Line Right  Result  Date: 01/31/2019 CLINICAL DATA:  Renal failure, needs hemodialysis access. Elevated white blood cell count of indeterminate significance. EXAM: EXAM RIGHT IJ CATHETER PLACEMENT UNDER ULTRASOUND AND FLUOROSCOPIC GUIDANCE TECHNIQUE: The procedure, risks (including but not limited to bleeding, infection, organ damage, pneumothorax), benefits, and alternatives were explained to the family. Questions regarding the procedure were encouraged and answered. The family understands and consents to the procedure. Patency of the right IJ vein was confirmed with ultrasound with image documentation. An appropriate skin site was determined. Skin site was marked. Region was prepped using maximum barrier technique including cap and mask, sterile gown,  sterile gloves, large sterile sheet, and Chlorhexidine as cutaneous antisepsis. The region was infiltrated locally with 1% lidocaine. Under real-time ultrasound guidance, the right IJ vein was accessed with a 21 gauge needle; the needle tip within the vein was confirmed with ultrasound image documentation. The needle exchanged over a 018 guidewire for vascular dilator which allowed advancement of a 16 cm Mahurkar catheter. This was positioned with the tip at the cavoatrial junction. Spot chest radiograph shows good positioning and no pneumothorax. Catheter was flushed and sutured externally with 0-Prolene sutures. Patient tolerated the procedure well. FLUOROSCOPY TIME:  Less than 6 seconds; 10 uGym2 DAP COMPLICATIONS: COMPLICATIONS none IMPRESSION: 1. Technically successful right IJ Mahurkar catheter placement. Electronically Signed   By: Lucrezia Europe M.D.   On: 01/31/2019 12:33   Ir US Guide Vasc Access Right  Result Date: 02/06/2019 CLINICAL DATA:  Renal failure needs durable venous access for hemodialysis. Temporary IJ catheter in place.  EXAM: TUNNELED HEMODIALYSIS CATHETER PLACEMENT WITH ULTRASOUND AND FLUOROSCOPIC GUIDANCE  TECHNIQUE: The procedure, risks, benefits, and alternatives were explained to the patient. Questions regarding the procedure were encouraged and answered. The patient understands and consents to the procedure. As antibiotic prophylaxis, cefazolin 2 g was ordered pre-procedure and administered intravenously within one hour of incision. Previously placed right IJ catheter and surrounding skin prepped using maximum barrier technique including cap and mask, sterile gown, sterile gloves, large sterile sheet, and Chlorhexidine as cutaneous antisepsis. The region was infiltrated locally with 1% lidocaine.  Intravenous Fentanyl 32mcg and Versed 2mg  were administered as conscious sedation during continuous monitoring of the patient's level of consciousness and physiological / cardiorespiratory  status by the radiology RN, with a total moderate sedation time of 15 minutes.  A Bentson wire was advanced through the catheter into the IVC, exchanged for an MPA catheter . A Palindrome 23 hemodialysis catheter was tunneled from the right anterior chest wall approach to the right IJ dermatotomy site. The MPA catheter was exchanged over an Amplatz wire for serial vascular dilators which allow placement of a peel-away sheath, through which the catheter was advanced under intermittent fluoroscopy, positioned with its tips at the cavoatrial junction. Spot chest radiograph confirms good catheter position. No pneumothorax. Catheter was flushed and primed per protocol. Catheter secured externally with O Prolene sutures.  COMPLICATIONS: COMPLICATIONS None immediate  FLUOROSCOPY TIME:  1 minutes; 257  uGym2 DAP  COMPARISON:  None  IMPRESSION: 1. Technically successful placement of tunneled right IJ hemodialysis catheter with ultrasound and fluoroscopic guidance. Ready for routine use.  ACCESS: Remains approachable for percutaneous intervention as needed.   Electronically Signed   By: Lucrezia Europe M.D.   On: 02/03/2019 10:34   Ir US Guide Vasc Access Right  Result Date: 01/31/2019 CLINICAL DATA:  Renal failure, needs hemodialysis access. Elevated white blood cell count of indeterminate significance.  EXAM: EXAM RIGHT IJ CATHETER PLACEMENT UNDER ULTRASOUND AND  FLUOROSCOPIC GUIDANCE  TECHNIQUE: The procedure, risks (including but not limited to bleeding, infection, organ damage, pneumothorax), benefits, and alternatives were explained to the family. Questions regarding the procedure were encouraged and answered. The family understands and consents to the procedure. Patency of the right IJ vein was confirmed with ultrasound with image documentation. An appropriate skin site was determined. Skin site was marked. Region was prepped using maximum barrier technique including cap and mask, sterile gown, sterile gloves,  large sterile sheet, and Chlorhexidine as cutaneous antisepsis. The region was infiltrated locally with 1% lidocaine. Under real-time ultrasound guidance, the right IJ vein was accessed with a 21 gauge needle; the needle tip within the vein was confirmed with ultrasound image documentation. The needle exchanged over a 018 guidewire for vascular dilator which allowed advancement of a 16 cm Mahurkar catheter. This was positioned with the tip at the cavoatrial junction. Spot chest radiograph shows good positioning and no pneumothorax. Catheter was flushed and sutured externally with 0-Prolene sutures. Patient tolerated the procedure well.  FLUOROSCOPY TIME:  Less than 6 seconds; 10 uGym2 DAP  COMPLICATIONS: COMPLICATIONS none  IMPRESSION: 1. Technically successful right IJ Mahurkar catheter placement.   Electronically Signed   By: Lucrezia Europe M.D.   On: 01/31/2019 12:33  Dg Chest Port 1 View  Result Date: 02/04/2019 CLINICAL DATA:  Initial evaluation for acute shortness of breath. EXAM: PORTABLE CHEST 1 VIEW COMPARISON:  Prior radiograph from 02/03/2019. FINDINGS: Right-sided hemodialysis catheter in place with tip overlying the cavoatrial junction. Cardiomegaly, stable. Mediastinal silhouette within normal limits. Lungs normally inflated. Perihilar vascular congestion without frank pulmonary edema. Small left pleural effusion. Hazy and patchy bibasilar opacities could reflect atelectasis, infiltrate, or edema. No pneumothorax. No acute osseous finding. IMPRESSION: 1. Cardiomegaly with mild perihilar vascular congestion without frank pulmonary edema. 2. Associated small left pleural effusion. 3. Superimposed hazy and patchy bibasilar opacities, favored to reflect atelectasis and/or edema. Superimposed infiltrates could be considered in the correct clinical setting. Electronically Signed   By: Jeannine Boga M.D.   On: 02/04/2019 05:52   Dg Chest Port 1 View  Result Date: 01/30/2019 CLINICAL DATA:   Weakness EXAM: PORTABLE CHEST 1 VIEW COMPARISON:  December 30, 2018 FINDINGS: Cardiomegaly with vascular congestion. Mild perihilar and bibasilar opacities concerning for early edema. No effusions. No acute bony abnormality. IMPRESSION: Cardiomegaly with vascular congestion and possible early perihilar edema. Electronically Signed   By: Rolm Baptise M.D.   On: 01/30/2019 18:50   US Scrotum W/doppler  Result Date: 01/31/2019 CLINICAL DATA:  48 year old male with pain and swelling. EXAM: SCROTAL ULTRASOUND DOPPLER ULTRASOUND OF THE TESTICLES TECHNIQUE: Complete ultrasound examination of the testicles, epididymis, and other scrotal structures was performed. Color and spectral Doppler ultrasound were also utilized to evaluate blood flow to the testicles. COMPARISON:  CT Abdomen and Pelvis 07/31/2017. FINDINGS: Right testicle Measurements: 3.0 x 2.1 x 1.9 centimeters. No mass or microlithiasis visualized. Left testicle Measurements: 2.8 x 1.9 x 2.2 centimeters. No mass or microlithiasis visualized. Right epididymis:  Normal in size and appearance. Left epididymis:  Normal in size and appearance. Hydrocele:  None visualized. Varicocele:  None visualized. Pulsed Doppler interrogation of both testes demonstrates normal low resistance arterial and venous waveforms bilaterally. Other findings: Generalized scrotal soft tissue thickening or edema. No ultrasound evidence of soft tissue gas. IMPRESSION: No evidence of testicular torsion, and negative aside from generalized scrotal soft tissue thickening/edema. Electronically Signed   By: Genevie Ann M.D.   On: 01/31/2019 00:34  Microbiology: Recent Results (from the past 240 hour(s))  SARS Coronavirus 2 (CEPHEID - Performed in [place] hospital lab), Hosp Order     Status: None   Collection Time: 01/30/19  7:58 PM   Specimen: Nasopharyngeal Swab  Result Value Ref Range Status   SARS Coronavirus 2 NEGATIVE NEGATIVE Final    Comment: (NOTE) If result is  NEGATIVE SARS-CoV-2 target nucleic acids are NOT DETECTED. The SARS-CoV-2 RNA is generally detectable in upper and lower  respiratory specimens during the acute phase of infection. The lowest  concentration of SARS-CoV-2 viral copies this assay can detect is 250  copies / mL. A negative result does not preclude SARS-CoV-2 infection  and should not be used as the sole basis for treatment or other  patient management decisions.  A negative result may occur with  improper specimen collection / handling, submission of specimen other  than nasopharyngeal swab, presence of viral mutation(s) within the  areas targeted by this assay, and inadequate number of viral copies  (<250 copies / mL). A negative result must be combined with clinical  observations, patient history, and epidemiological information. If result is POSITIVE SARS-CoV-2 target nucleic acids are DETECTED. The SARS-CoV-2 RNA is generally detectable in upper and lower  respiratory specimens dur ing the acute phase of infection.  Positive  results are indicative of active infection with SARS-CoV-2.  Clinical  correlation with patient history and other diagnostic information is  necessary to determine patient infection status.  Positive results do  not rule out bacterial infection or co-infection with other viruses. If result is PRESUMPTIVE POSTIVE SARS-CoV-2 nucleic acids MAY BE PRESENT.   A presumptive positive result was obtained on the submitted specimen  and confirmed on repeat testing.  While 2019 novel coronavirus  (SARS-CoV-2) nucleic acids may be present in the submitted sample  additional confirmatory testing may be necessary for epidemiological  and / or clinical management purposes  to differentiate between  SARS-CoV-2 and other Sarbecovirus currently known to infect humans.  If clinically indicated additional testing with an alternate test  methodology [phone]) is advised. The SARS-CoV-2 RNA is generally  detectable  in upper and lower respiratory sp ecimens during the acute  phase of infection. The expected result is Negative. Fact Sheet for Patients:  StrictlyIdeas.no Fact Sheet for Healthcare Providers: BankingDealers.co.za This test is not yet approved or cleared by the [place] FDA and has been authorized for detection and/or diagnosis of SARS-CoV-2 by FDA under an Emergency Use Authorization (EUA).  This EUA will remain in effect (meaning this test can be used) for the duration of the COVID-19 declaration under Section 564(b)(1) of the Act, 21 U.S.C. section 360bbb-3(b)(1), unless the authorization is terminated or revoked sooner. Performed at [facility], [place] [address] [place], [place] [address]   MRSA PCR Screening     Status: None   Collection Time: 01/31/19  3:24 AM   Specimen: Nasopharyngeal  Result Value Ref Range Status   MRSA by PCR NEGATIVE NEGATIVE Final    Comment:        The GeneXpert MRSA Assay (FDA approved for NASAL specimens only), is one component of a comprehensive MRSA colonization surveillance program. It is not intended to diagnose MRSA infection nor to guide or monitor treatment for MRSA infections. Performed at [facility], [place] [address] [place], [place] [address]   SARS Coronavirus 2 (CEPHEID - Performed in [place] hospital lab), [facility] Order     Status: None  Collection Time: 02/06/19  6:10 PM   Specimen: Nasopharyngeal Swab  Result Value Ref Range Status   SARS Coronavirus 2 NEGATIVE NEGATIVE Final    Comment: (NOTE) If result is NEGATIVE SARS-CoV-2 target nucleic acids are NOT DETECTED. The SARS-CoV-2 RNA is generally detectable in upper and lower  respiratory specimens during the acute phase of infection. The lowest  concentration of SARS-CoV-2 viral copies this assay can detect is 250  copies / mL. A negative result does not preclude SARS-CoV-2 infection  and should not be used  as the sole basis for treatment or other  patient management decisions.  A negative result may occur with  improper specimen collection / handling, submission of specimen other  than nasopharyngeal swab, presence of viral mutation(s) within the  areas targeted by this assay, and inadequate number of viral copies  (<250 copies / mL). A negative result must be combined with clinical  observations, patient history, and epidemiological information. If result is POSITIVE SARS-CoV-2 target nucleic acids are DETECTED. The SARS-CoV-2 RNA is generally detectable in upper and lower  respiratory specimens dur ing the acute phase of infection.  Positive  results are indicative of active infection with SARS-CoV-2.  Clinical  correlation with patient history and other diagnostic information is  necessary to determine patient infection status.  Positive results do  not rule out bacterial infection or co-infection with other viruses. If result is PRESUMPTIVE POSTIVE SARS-CoV-2 nucleic acids MAY BE PRESENT.   A presumptive positive result was obtained on the submitted specimen  and confirmed on repeat testing.  While 2019 novel coronavirus  (SARS-CoV-2) nucleic acids may be present in the submitted sample  additional confirmatory testing may be necessary for epidemiological  and / or clinical management purposes  to differentiate between  SARS-CoV-2 and other Sarbecovirus currently known to infect humans.  If clinically indicated additional testing with an alternate test  methodology [phone]) is advised. The SARS-CoV-2 RNA is generally  detectable in upper and lower respiratory sp ecimens during the acute  phase of infection. The expected result is Negative. Fact Sheet for Patients:  StrictlyIdeas.no Fact Sheet for Healthcare Providers: BankingDealers.co.za This test is not yet approved or cleared by the [place] FDA and has been authorized for  detection and/or diagnosis of SARS-CoV-2 by FDA under an Emergency Use Authorization (EUA).  This EUA will remain in effect (meaning this test can be used) for the duration of the COVID-19 declaration under Section 564(b)(1) of the Act, 21 U.S.C. section 360bbb-3(b)(1), unless the authorization is terminated or revoked sooner. Performed at [facility], [place] [address], [place], [place] [address]      Labs: Basic Metabolic Panel: Recent Labs  Lab 02/02/19 0962 02/03/19 0342 02/04/19 0532 02/04/19 8366 02/05/19 0310 02/06/19 0742 02/07/19 0034 02/08/19 0622  NA 137 134* 133*  --  133* 131* 129* 130*  K 4.1 4.0 3.8  --  3.6 3.9 3.9 4.0  CL 102 100 100  --  98 96* 96* 97*  CO2 20* 24 21*  --  25 25 24 24   GLUCOSE 116* 126* 98  --  134* 106* 168* 137*  BUN 84* 50* 57*  --  32* 40* 27* 22*  CREATININE 11.77* 8.04* 8.82* 8.81* 5.96* 7.31* 5.20* 4.48*  CALCIUM 6.1* 6.4* 6.3*  --  6.3* 6.5* 6.8* 7.1*  MG  --   --   --   --   --   --  1.6* 1.6*  PHOS 6.1* 4.2  --   --   --  3.7  --   --    Liver Function Tests: Recent Labs  Lab 02/02/19 0322 02/03/19 0342 02/04/19 0532 02/06/19 0742  AST  --   --  26  --   ALT  --   --  7  --   ALKPHOS  --   --  74  --   BILITOT  --   --  0.3  --   PROT  --   --  5.6*  --   ALBUMIN 1.6* 1.5* 1.6* 1.5*   No results for input(s): LIPASE, AMYLASE in the last 168 hours. No results for input(s): AMMONIA in the last 168 hours. CBC: Recent Labs  Lab 02/04/19 0532 02/04/19 0625 02/05/19 0310 02/06/19 0636 02/07/19 0034 02/08/19 0622  WBC 13.5* 13.3* 11.8* 15.2* 14.8* 14.0*  NEUTROABS 9.8*  --  8.3* 11.9* 11.3* 9.7*  HGB 7.8* 8.0* 7.3* 7.5* 7.0* 7.0*  HCT 25.2* 26.0* 23.6* 24.4* 22.9* 22.8*  MCV 96.6 97.0 96.3 98.4 96.6 98.7  PLT 200 202 146* 150 120* 138*   Cardiac Enzymes: No results for input(s): CKTOTAL, CKMB, CKMBINDEX, TROPONINI in the last 168 hours. BNP: BNP (last 3 results) No results for input(s): BNP in the last  8760 hours.  ProBNP (last 3 results) No results for input(s): PROBNP in the last 8760 hours.  CBG: Recent Labs  Lab 02/07/19 1654 02/07/19 2108 02/08/19 0721 02/08/19 1123 02/08/19 1629  GLUCAP 338* 160* 116* 132* 147*       Signed:  Alma Friendly, MD [facility] 02/08/2019, 5:17 PM

## 2019-02-08 NOTE — Progress Notes (Addendum)
DISCHARGE NOTE  Michael Riggs to be discharged to In patient  Rehab per MD order. Patient verbalized understanding.  Alert and oriented x 4. Skin warm and dry. Denies pain or any discomfort. Report given to receiving RN.  All belongings moved to his new room.    Viviano Simas, RN

## 2019-02-08 NOTE — Progress Notes (Signed)
Inpatient Diabetes Program Recommendations  AACE/ADA: New Consensus Statement on Inpatient Glycemic Control   Target Ranges:  Prepandial:   less than 140 mg/dL      Peak postprandial:   less than 180 mg/dL (1-2 hours)      Critically ill patients:  140 - 180 mg/dL  Results for VIRGEL, HARO (MRN [id number]) as of 02/08/2019 13:01  Ref. Range 02/07/2019 06:59 02/07/2019 11:39 02/07/2019 16:54 02/07/2019 21:08 02/08/2019 07:21 02/08/2019 11:23  Glucose-Capillary Latest Ref Range: 70 - 99 mg/dL 114 (H) 114 (H) 338 (H) 160 (H) 116 (H) 132 (H)   Results for LERAY, GARVERICK (MRN [id number]) as of 02/08/2019 13:01  Ref. Range 01/31/2019 11:18  Hemoglobin A1C Latest Ref Range: 4.8 - 5.6 % 5.0   Review of Glycemic Control  Diabetes history: DM2 Outpatient Diabetes medications: None Current orders for Inpatient glycemic control: CBGs AC&HS  Inpatient Diabetes Program Recommendations:   Correction (SSI):  CBGs being checked AC&HS but no insulin correction ordered. Please consider ordering Novolog 0-9 units TID with meals and Novolog 0-5 units QHS.  Thanks, Barnie Alderman, RN, MSN, CDE Diabetes Coordinator Inpatient Diabetes Program [phone] (Team Pager from 8am to 5pm)

## 2019-02-08 NOTE — Progress Notes (Signed)
Left dialysis access duplex       has been completed. Preliminary results can be found under CV proc through chart review. June Leap, BS, RDMS, RVT

## 2019-02-08 NOTE — Plan of Care (Signed)
  Problem: Education: Goal: Knowledge of General Education information will improve Description: Including pain rating scale, medication(s)/side effects and non-pharmacologic comfort measures Outcome: Adequate for Discharge   Problem: Health Behavior/Discharge Planning: Goal: Ability to manage health-related needs will improve Outcome: Adequate for Discharge   Problem: Clinical Measurements: Goal: Ability to maintain clinical measurements within normal limits will improve Outcome: Adequate for Discharge Goal: Diagnostic test results will improve Outcome: Adequate for Discharge Goal: Respiratory complications will improve Outcome: Adequate for Discharge Goal: Cardiovascular complication will be avoided Outcome: Adequate for Discharge   Problem: Activity: Goal: Risk for activity intolerance will decrease Outcome: Adequate for Discharge   Problem: Nutrition: Goal: Adequate nutrition will be maintained Outcome: Adequate for Discharge   Problem: Coping: Goal: Level of anxiety will decrease Outcome: Adequate for Discharge   Problem: Elimination: Goal: Will not experience complications related to bowel motility Outcome: Adequate for Discharge Goal: Will not experience complications related to urinary retention Outcome: Adequate for Discharge   Problem: Safety: Goal: Ability to remain free from injury will improve Outcome: Adequate for Discharge   Problem: Skin Integrity: Goal: Risk for impaired skin integrity will decrease Outcome: Adequate for Discharge   Problem: Education: Goal: Knowledge of disease and its progression will improve Outcome: Adequate for Discharge Goal: Individualized Educational Video(s) Outcome: Adequate for Discharge   Problem: Fluid Volume: Goal: Compliance with measures to maintain balanced fluid volume will improve Outcome: Adequate for Discharge   Problem: Health Behavior/Discharge Planning: Goal: Ability to manage health-related needs will  improve Outcome: Adequate for Discharge   Problem: Nutritional: Goal: Ability to make healthy dietary choices will improve Outcome: Adequate for Discharge   Problem: Clinical Measurements: Goal: Complications related to the disease process, condition or treatment will be avoided or minimized Outcome: Adequate for Discharge

## 2019-02-08 NOTE — Progress Notes (Signed)
Inpatient Rehabilitation-Admissions Coordinator   Spoke to in house HD nurse via phone- notified her that this patient will be admitted to [facility] today.   Jhonnie Garner, OTR/L  Rehab Admissions Coordinator  [phone] 02/08/2019 7:06 PM

## 2019-02-08 NOTE — Progress Notes (Signed)
Inpatient Rehabilitation-Admissions Coordinator   Pt's insurance has issued an initial denial for CIR request. PM&R MD, Dr. Naaman Plummer has agreed to complete peer to peer as an attempt to overturn the decision. Will follow up once peer to peer completed and final determination has been made.   Jhonnie Garner, OTR/L  Rehab Admissions Coordinator  [phone] 02/08/2019 10:56 AM

## 2019-02-08 NOTE — Progress Notes (Signed)
Inpatient Rehabilitation-Admissions Coordinator   [facility] has received insurance approval and medical clearance by Dr. Horris Latino. Pt will admit to CIR today. RN updated on plan. Pt would like to proceed with CIR. All paperwork reviewed and consents signed.   Please call if questions.   Jhonnie Garner, OTR/L  Rehab Admissions Coordinator  [phone] 02/08/2019 5:53 PM

## 2019-02-08 NOTE — Progress Notes (Signed)
Occupational Therapy Treatment Patient Details Name: Michael Riggs MRN: [id number] DOB: 05-31-1971 Today's Date: 02/08/2019    History of present illness Pt is a 48 y/o male admitted secondary to increased SOB weakness. Found to be in volume overload and in need of HD. PMH includes CKD, DM, HTN, L AV fistula, and s/p L BKA.    OT comments  Pt progressing towards OT goals this session. Initiated session with donning scrotal sling in supine. Significant pain, but stated improved with sling. Educated Pt with use of ice to assist as well and he declined at this time. Pt then was able to come EOB with max A +2, Pt initiating all aspects of movement despite pain and eager to work with therapy. Able to sit briefly EOB with strong posterior lean and requiring BUE support and use of bed rail so required return to supine before engaging in grooming at bed level. OT will continue to follow - CIR remains appropriate dc venue and essential to maximize independence and safety.    Follow Up Recommendations  CIR    Equipment Recommendations  Other (comment)(defer to next venue of care)    Recommendations for Other Services      Precautions / Restrictions Precautions Precautions: Fall;Other (comment) Precaution Comments: L BKA; prosthetic is in room Restrictions Weight Bearing Restrictions: No       Mobility Bed Mobility Overal bed mobility: Needs Assistance Bed Mobility: Supine to Sit;Sit to Supine     Supine to sit: Max assist;+2 for physical assistance Sit to supine: Max assist;+2 for physical assistance;+2 for safety/equipment   General bed mobility comments: max A +2 today due to pain, tolerated short duration of sitting EOB with posterior lean  Transfers                 General transfer comment: unable today due to pain, patient reports eagerness to attempt     Balance Overall balance assessment: Needs assistance   Sitting balance-Leahy Scale: Poor(approaching  Fair) Sitting balance - Comments: Tending to lean back with difficulty flexing hips to lean forward due to scrotal edema and general body habitus; able to reach and support self with bil UE support on seated surface and holding foot board; very uncomfortable, and requested to lay back down Postural control: Posterior lean                                 ADL either performed or assessed with clinical judgement   ADL Overall ADL's : Needs assistance/impaired     Grooming: Set up;Bed level;Wash/dry hands;Wash/dry face Grooming Details (indicate cue type and reason): bed more in chair position                 Toilet Transfer: Maximal assistance;+2 for physical assistance;+2 for safety/equipment   Toileting- Clothing Manipulation and Hygiene: Maximal assistance;+2 for physical assistance;+2 for safety/equipment;Bed level       Functional mobility during ADLs: Total assistance       Vision       Perception     Praxis      Cognition Arousal/Alertness: Awake/alert Behavior During Therapy: WFL for tasks assessed/performed Overall Cognitive Status: Within Functional Limits for tasks assessed                                 General Comments: Participating well  Exercises     Shoulder Instructions       General Comments      Pertinent Vitals/ Pain       Pain Assessment: Faces Faces Pain Scale: Hurts whole lot Pain Location: BLE, scrotal area with movement, back hips and stomach  Pain Descriptors / Indicators: Grimacing;Guarding;Sharp Pain Intervention(s): Monitored during session;Repositioned;Other (comment)(scrotal sling - declined ice)  Home Living                                          Prior Functioning/Environment              Frequency  Min 3X/week        Progress Toward Goals  OT Goals(current goals can now be found in the care plan section)  Progress towards OT goals: Progressing toward  goals  Acute Rehab OT Goals Patient Stated Goal: "to be able to walk to the bathroom and be able to take care of myself" OT Goal Formulation: With patient Time For Goal Achievement: 02/19/19 Potential to Achieve Goals: Good  Plan Discharge plan remains appropriate;Frequency remains appropriate    Co-evaluation    PT/OT/SLP Co-Evaluation/Treatment: Yes Reason for Co-Treatment: For patient/therapist safety;To address functional/ADL transfers PT goals addressed during session: Balance;Strengthening/ROM OT goals addressed during session: ADL's and self-care;Proper use of Adaptive equipment and DME;Strengthening/ROM;Other (comment)(scrotal sling management)      AM-PAC OT "6 Clicks" Daily Activity     Outcome Measure   Help from another person eating meals?: None Help from another person taking care of personal grooming?: A Little Help from another person toileting, which includes using toliet, bedpan, or urinal?: A Lot Help from another person bathing (including washing, rinsing, drying)?: A Lot Help from another person to put on and taking off regular upper body clothing?: A Lot Help from another person to put on and taking off regular lower body clothing?: Total 6 Click Score: 14    End of Session Equipment Utilized During Treatment: Other (comment)(scrotal sling)  OT Visit Diagnosis: Pain;Muscle weakness (generalized) (M62.81) Pain - Right/Left: Right Pain - part of body: Leg(abdomen, scrotum)   Activity Tolerance Patient limited by pain   Patient Left in bed;with call bell/phone within reach   Nurse Communication Other (comment);Mobility status(scrotal sling back on)        Time: 1400-1430 OT Time Calculation (min): 30 min  Charges: OT General Charges $OT Visit: 1 Visit OT Treatments $Self Care/Home Management : 8-22 mins  [name] OTR/L Acute Rehabilitation Services Pager: [phone] Office: [place] 02/08/2019, 4:48 PM

## 2019-02-08 NOTE — Progress Notes (Signed)
Physical Therapy Treatment Patient Details Name: Michael Riggs MRN: [id number] DOB: 05/23/1971 Today's Date: 02/08/2019    History of Present Illness Pt is a 48 y/o male admitted secondary to increased SOB weakness. Found to be in volume overload and in need of HD. PMH includes CKD, DM, HTN, L AV fistula, and s/p L BKA.     PT Comments    Patient eager to work with therapy today, expressing he wishes to get out of bed and walk again. Unfortunately limited greatly by ongoing pain now in back/stomach/hips and scrotal area. Max A to bring to sitting and not tolerating upright posture at this time. Repositioned in bed with chair mode to patients comfort.    Follow Up Recommendations  CIR     Equipment Recommendations  None recommended by PT    Recommendations for Other Services       Precautions / Restrictions Precautions Precautions: Fall;Other (comment) Precaution Comments: L BKA; prosthetic is at home  Restrictions Weight Bearing Restrictions: No    Mobility  Bed Mobility Overal bed mobility: Needs Assistance Bed Mobility: Supine to Sit;Sit to Supine     Supine to sit: Max assist Sit to supine: Max assist   General bed mobility comments: max A today due to pain, tolerated short duration of sitting EOB with posterior lean  Transfers                 General transfer comment: unable today due to pain, patient reports eagerness to attempt   Ambulation/Gait                 Stairs             Wheelchair Mobility    Modified Rankin (Stroke Patients Only)       Balance Overall balance assessment: Needs assistance   Sitting balance-Leahy Scale: Poor(approaching Fair) Sitting balance - Comments: Tending to lean back with difficulty flexing hips to lean forward due to scrotal edema and general body habitus; able to reach and support self with bil UE support on seated surface and holding foot board; very uncomfortable, and requested to lay  back down Postural control: Posterior lean                                  Cognition Arousal/Alertness: Awake/alert Behavior During Therapy: WFL for tasks assessed/performed Overall Cognitive Status: Within Functional Limits for tasks assessed                                 General Comments: Participating well      Exercises      General Comments        Pertinent Vitals/Pain Pain Assessment: Faces Faces Pain Scale: Hurts whole lot Pain Location: BLE, scrotal area with movement, back hips and stomach  Pain Descriptors / Indicators: Grimacing;Guarding;[place]                      Prior Function            PT Goals (current goals can now be found in the care plan section) Acute Rehab PT Goals Patient Stated Goal: "to be able to walk to the bathroom and be able to take care of myself" PT Goal Formulation: With patient Time For Goal Achievement: 02/18/19 Potential to Achieve Goals: Good  Frequency    Min 3X/week      PT Plan Current plan remains appropriate    Co-evaluation              AM-PAC PT "6 Clicks" Mobility   Outcome Measure  Help needed turning from your back to your side while in a flat bed without using bedrails?: A Lot Help needed moving from lying on your back to sitting on the side of a flat bed without using bedrails?: A Lot Help needed moving to and from a bed to a chair (including a wheelchair)?: A Lot Help needed standing up from a chair using your arms (e.g., wheelchair or bedside chair)?: A Lot Help needed to walk in hospital room?: Total Help needed climbing 3-5 steps with a railing? : Total 6 Click Score: 10    End of Session   Activity Tolerance: Patient limited by pain Patient left: in bed;with call bell/phone within reach;with bed alarm set Nurse Communication: Mobility status PT Visit Diagnosis: Muscle weakness (generalized) (M62.81);Difficulty in walking, not elsewhere  classified (R26.2)     Time: 2919-1660 PT Time Calculation (min) (ACUTE ONLY): 23 min  Charges:  $Therapeutic Activity: 8-22 mins                    Reinaldo Berber, PT, DPT Acute Rehabilitation Services Pager: [phone] Office: [phone]     Reinaldo Berber 02/08/2019, 3:19 PM

## 2019-02-09 ENCOUNTER — Other Ambulatory Visit: Payer: Self-pay

## 2019-02-09 ENCOUNTER — Inpatient Hospital Stay (HOSPITAL_COMMUNITY): Payer: Medicare Other

## 2019-02-09 ENCOUNTER — Encounter (HOSPITAL_COMMUNITY): Payer: Self-pay

## 2019-02-09 DIAGNOSIS — R238 Other skin changes: Secondary | ICD-10-CM

## 2019-02-09 DIAGNOSIS — I10 Essential (primary) hypertension: Secondary | ICD-10-CM

## 2019-02-09 DIAGNOSIS — E669 Obesity, unspecified: Secondary | ICD-10-CM

## 2019-02-09 DIAGNOSIS — E1169 Type 2 diabetes mellitus with other specified complication: Secondary | ICD-10-CM

## 2019-02-09 DIAGNOSIS — R601 Generalized edema: Secondary | ICD-10-CM

## 2019-02-09 DIAGNOSIS — R5381 Other malaise: Secondary | ICD-10-CM | POA: Diagnosis present

## 2019-02-09 HISTORY — DX: Other malaise: R53.81

## 2019-02-09 LAB — CBC WITH DIFFERENTIAL/PLATELET
Abs Immature Granulocytes: 0.32 10*3/uL — ABNORMAL HIGH (ref 0.00–0.07)
Basophils Absolute: 0.1 10*3/uL (ref 0.0–0.1)
Basophils Relative: 0 %
Eosinophils Absolute: 0.8 10*3/uL — ABNORMAL HIGH (ref 0.0–0.5)
Eosinophils Relative: 5 %
HCT: 21.8 % — ABNORMAL LOW (ref 39.0–52.0)
Hemoglobin: 6.6 g/dL — CL (ref 13.0–17.0)
Immature Granulocytes: 2 %
Lymphocytes Relative: 9 %
Lymphs Abs: 1.4 10*3/uL (ref 0.7–4.0)
MCH: 30.3 pg (ref 26.0–34.0)
MCHC: 30.3 g/dL (ref 30.0–36.0)
MCV: 100 fL (ref 80.0–100.0)
Monocytes Absolute: 1.6 10*3/uL — ABNORMAL HIGH (ref 0.1–1.0)
Monocytes Relative: 10 %
Neutro Abs: 11.7 10*3/uL — ABNORMAL HIGH (ref 1.7–7.7)
Neutrophils Relative %: 74 %
Platelets: 188 10*3/uL (ref 150–400)
RBC: 2.18 MIL/uL — ABNORMAL LOW (ref 4.22–5.81)
RDW: 12.4 % (ref 11.5–15.5)
WBC: 15.8 10*3/uL — ABNORMAL HIGH (ref 4.0–10.5)
nRBC: 0 % (ref 0.0–0.2)

## 2019-02-09 LAB — GLUCOSE, CAPILLARY
Glucose-Capillary: 143 mg/dL — ABNORMAL HIGH (ref 70–99)
Glucose-Capillary: 123 mg/dL — ABNORMAL HIGH (ref 70–99)
Glucose-Capillary: 125 mg/dL — ABNORMAL HIGH (ref 70–99)
Glucose-Capillary: 159 mg/dL — ABNORMAL HIGH (ref 70–99)

## 2019-02-09 LAB — CREATININE, SERUM
Creatinine, Ser: 6.42 mg/dL — ABNORMAL HIGH (ref 0.61–1.24)
GFR calc Af Amer: 11 mL/min — ABNORMAL LOW (ref >60)
GFR calc non Af Amer: 9 mL/min — ABNORMAL LOW (ref >60)

## 2019-02-09 LAB — ABO/RH: ABO/RH(D): O POS

## 2019-02-09 LAB — PREPARE RBC (CROSSMATCH)

## 2019-02-09 MED ORDER — HYDROXYZINE HCL 25 MG PO TABS
25.0000 mg | ORAL_TABLET | Freq: Three times a day (TID) | ORAL | Status: DC | PRN
  Administered 2019-03-09 – 2019-03-10 (×2): 25 mg via ORAL
  Filled 2019-02-09 (×4): qty 1

## 2019-02-09 MED ORDER — COLLAGENASE 250 UNIT/GM EX OINT
TOPICAL_OINTMENT | Freq: Every day | CUTANEOUS | Status: DC
  Administered 2019-02-09 – 2019-03-14 (×30): via TOPICAL
  Filled 2019-02-09 (×6): qty 30

## 2019-02-09 MED ORDER — ACETAMINOPHEN 325 MG PO TABS
650.0000 mg | ORAL_TABLET | Freq: Four times a day (QID) | ORAL | Status: DC | PRN
  Administered 2019-02-09 – 2019-03-14 (×41): 650 mg via ORAL
  Filled 2019-02-09 (×37): qty 2

## 2019-02-09 MED ORDER — TRAMADOL HCL 50 MG PO TABS
50.0000 mg | ORAL_TABLET | Freq: Four times a day (QID) | ORAL | Status: DC | PRN
  Administered 2019-02-09 – 2019-02-12 (×7): 50 mg via ORAL
  Filled 2019-02-09 (×7): qty 1

## 2019-02-09 MED ORDER — SODIUM CHLORIDE 0.9 % IV SOLN
62.5000 mg | INTRAVENOUS | Status: DC
  Administered 2019-02-11: 62.5 mg via INTRAVENOUS
  Filled 2019-02-09 (×2): qty 5

## 2019-02-09 MED ORDER — PANTOPRAZOLE SODIUM 40 MG PO TBEC
40.0000 mg | DELAYED_RELEASE_TABLET | Freq: Every day | ORAL | Status: DC
  Administered 2019-02-09 – 2019-03-14 (×33): 40 mg via ORAL
  Filled 2019-02-09 (×34): qty 1

## 2019-02-09 MED ORDER — GABAPENTIN 300 MG PO CAPS
300.0000 mg | ORAL_CAPSULE | Freq: Every day | ORAL | Status: DC
  Administered 2019-02-09 – 2019-03-13 (×34): 300 mg via ORAL
  Filled 2019-02-09 (×34): qty 1

## 2019-02-09 MED ORDER — HEPARIN SODIUM (PORCINE) 5000 UNIT/ML IJ SOLN: 5000.0000 [IU] | Freq: Three times a day (TID) | INTRAMUSCULAR | Status: DC

## 2019-02-09 MED ORDER — HEPARIN SODIUM (PORCINE) 1000 UNIT/ML IJ SOLN
INTRAMUSCULAR | Status: AC
  Filled 2019-02-09: qty 1

## 2019-02-09 MED ORDER — CITALOPRAM HYDROBROMIDE 10 MG PO TABS
40.0000 mg | ORAL_TABLET | Freq: Every day | ORAL | Status: DC
  Administered 2019-02-09 – 2019-03-14 (×33): 40 mg via ORAL
  Filled 2019-02-09 (×37): qty 4

## 2019-02-09 MED ORDER — MELATONIN 3 MG PO TABS
4.5000 mg | ORAL_TABLET | Freq: Every day | ORAL | Status: DC
  Administered 2019-02-09 – 2019-03-13 (×32): 4.5 mg via ORAL
  Filled 2019-02-09 (×34): qty 1.5

## 2019-02-09 MED ORDER — HEPARIN SODIUM (PORCINE) 5000 UNIT/ML IJ SOLN
5000.0000 [IU] | Freq: Three times a day (TID) | INTRAMUSCULAR | Status: DC
  Administered 2019-02-09 – 2019-03-14 (×82): 5000 [IU] via SUBCUTANEOUS
  Filled 2019-02-09 (×84): qty 1

## 2019-02-09 MED ORDER — AMLODIPINE BESYLATE 10 MG PO TABS
10.0000 mg | ORAL_TABLET | Freq: Every day | ORAL | Status: DC
  Administered 2019-02-09 – 2019-02-16 (×7): 10 mg via ORAL
  Filled 2019-02-09 (×8): qty 1

## 2019-02-09 MED ORDER — CALCITRIOL 0.25 MCG PO CAPS
0.2500 ug | ORAL_CAPSULE | Freq: Every day | ORAL | Status: DC
  Administered 2019-02-09 – 2019-03-07 (×27): 0.25 ug via ORAL
  Filled 2019-02-09 (×27): qty 1

## 2019-02-09 MED ORDER — ACETAMINOPHEN 325 MG PO TABS
ORAL_TABLET | ORAL | Status: AC
  Filled 2019-02-09: qty 2

## 2019-02-09 MED ORDER — SODIUM CHLORIDE 0.9% IV SOLUTION: Freq: Once | INTRAVENOUS | Status: DC

## 2019-02-09 MED ORDER — ATORVASTATIN CALCIUM 40 MG PO TABS
40.0000 mg | ORAL_TABLET | Freq: Every day | ORAL | Status: DC
  Administered 2019-02-09 – 2019-03-14 (×33): 40 mg via ORAL
  Filled 2019-02-09 (×34): qty 1

## 2019-02-09 MED ORDER — SORBITOL 70 % SOLN
30.0000 mL | Freq: Every day | Status: DC | PRN
  Administered 2019-02-09 – 2019-02-13 (×4): 30 mL via ORAL
  Filled 2019-02-09 (×4): qty 30

## 2019-02-09 MED ORDER — DARBEPOETIN ALFA 100 MCG/0.5ML IJ SOSY
100.0000 ug | PREFILLED_SYRINGE | INTRAMUSCULAR | Status: DC
  Filled 2019-02-09: qty 0.5

## 2019-02-09 MED ORDER — HEPARIN SODIUM (PORCINE) 1000 UNIT/ML DIALYSIS
20.0000 [IU]/kg | INTRAMUSCULAR | Status: DC | PRN
  Filled 2019-02-09: qty 3

## 2019-02-09 MED ORDER — ALBUTEROL SULFATE (2.5 MG/3ML) 0.083% IN NEBU
3.0000 mL | INHALATION_SOLUTION | Freq: Four times a day (QID) | RESPIRATORY_TRACT | Status: DC | PRN
  Administered 2019-03-06: 14:00:00 3 mL via RESPIRATORY_TRACT
  Filled 2019-02-09: qty 3

## 2019-02-09 MED ORDER — CALCIUM CARBONATE ANTACID 500 MG PO CHEW
1.0000 | CHEWABLE_TABLET | Freq: Two times a day (BID) | ORAL | Status: DC
  Administered 2019-02-09 – 2019-03-04 (×41): 200 mg via ORAL
  Filled 2019-02-09 (×45): qty 1

## 2019-02-09 MED ORDER — ACETAMINOPHEN 650 MG RE SUPP: 650.0000 mg | Freq: Four times a day (QID) | RECTAL | Status: DC | PRN

## 2019-02-09 NOTE — Evaluation (Signed)
Occupational Therapy Assessment and Plan  Patient Details  Name: Michael Riggs MRN: [id number] Date of Birth: 1971/07/13  OT Diagnosis: abnormal posture, acute pain, muscle weakness (generalized), swelling of limb and swelling of scrotum Rehab Potential:   ELOS: 10-14   Today's Date: 02/09/2019 OT Individual Time: 0700-0800 OT Individual Time Calculation (min): 60 min     Problem List:  Patient Active Problem List   Diagnosis Date Noted  . Debility 02/09/2019  . Fluid overload 02/04/2019  . Hyperkalemia 01/30/2019  . ARF (acute renal failure) ([place]) 01/30/2019  . Hypokalemia 01/30/2019  . Hypoglycemia 01/30/2019  . Syphilis 01/30/2019  . Macrocytic anemia 01/30/2019  . ESRD (end stage renal disease) ([place])   . PDR (proliferative diabetic retinopathy) ([place]) 12/14/2013  . Venous hypertension 09/22/2013  . Diabetes mellitus type 2 in obese ([place]) 07/04/2013  . Habitual alcohol use 07/04/2013  . Obesity, Class II, BMI 35-39.9, with comorbidity 07/04/2013    Past Medical History:  Past Medical History:  Diagnosis Date  . Anemia   . Chronic kidney disease    Stage 5  . Depression   . Diabetes mellitus without complication ([place])   . Dyspnea   . GERD (gastroesophageal reflux disease)   . Hypertension    Past Surgical History:  Past Surgical History:  Procedure Laterality Date  . AV FISTULA PLACEMENT Left 01/09/2019   Procedure: ARTERIOVENOUS (AV) FISTULA CREATION LEFT UPPER ARM;  Surgeon: [name], MD;  Location: [facility] OR;  Service: Vascular;  Laterality: Left;  . below the knee amputation Left   . EYE SURGERY    . IR FLUORO GUIDE CV LINE RIGHT  01/31/2019  . IR FLUORO GUIDE CV LINE RIGHT  02/03/2019  . IR US GUIDE VASC ACCESS RIGHT  01/31/2019  . IR US GUIDE VASC ACCESS RIGHT  02/03/2019    Assessment & Plan Clinical Impression:   Michael Riggs is a 48 year old right-handed male history of syphilis, anemia of chronic disease, chronic kidney disease with recent  dialysis initiated, diabetes mellitus, morbid obesity with BMI of 43.78, hypertension and left BKA 5 years ago in [facility]. Per chart review patient lives with his elderly mother. Reportedly independent with prosthesis and wheelchair prior to admission. 1 level home. Mother is limited physically and receives CAPSassistance for herself 5 hours a day. He has a brother that works nights. Recent admission 01/30/2019 to 02/03/2019 for uremia with acute kidney injury receiving tunneled dialysis catheter 02/02/2019 and was to begin outpatient hemodialysis at [facility] clinic Tuesday Thursday Saturday. Patient presented 02/04/2019 with increasing shortness of breath as well as scrotal edema. Chest x-ray showed mild vascular congestion. There was noted skin tears of the right lower extremity patient had noted fall since recent discharge home. Hemoglobin 7.9, creatinine 8.82, calcium 6.3, COVID negative. Hemodialysis again follow-up and did initially receive an extra treatment of hemodialysis for suspect volume overload.WOCfollow-up for wounds to right lower leg with dressing changes as directed. Subcutaneous heparin for DVT prophylaxis.   Patient currently requires max with basic self-care skills secondary to muscle weakness, decreased cardiorespiratoy endurance and decreased sitting balance, decreased standing balance and decreased balance strategies.  Prior to hospitalization, patient could complete BADL with modified independent .  Patient will benefit from skilled intervention to decrease level of assist with basic self-care skills and increase independence with basic self-care skills prior to discharge home with care partner.  Anticipate patient will require 24 hour supervision and follow up home health.  OT - End of  Session Activity Tolerance: Tolerates 10 - 20 min activity with multiple rests Endurance Deficit: Yes OT Assessment Rehab Potential (ACUTE ONLY): Good OT  Barriers to Discharge: Inaccessible home environment;Decreased caregiver support;Lack of/limited family support OT Patient demonstrates impairments in the following area(s): Balance;Cognition;Edema;Endurance;Nutrition;Pain;Safety OT Basic ADL's Functional Problem(s): Grooming;Bathing;Dressing;Toileting OT Advanced ADL's Functional Problem(s): Simple Meal Preparation OT Transfers Functional Problem(s): Tub/Shower;Toilet OT Additional Impairment(s): Fuctional Use of Upper Extremity OT Plan OT Intensity: Minimum of 1-2 x/day, 45 to 90 minutes OT Frequency: 5 out of 7 days OT Duration/Estimated Length of Stay: 10-14 OT Treatment/Interventions: Balance/vestibular training;Discharge planning;Pain management;Self Care/advanced ADL retraining;UE/LE Coordination activities;Therapeutic Activities;Functional mobility training;Disease mangement/prevention;Patient/family education;Skin care/wound managment;Therapeutic Exercise;Visual/perceptual remediation/compensation;DME/adaptive equipment instruction;Community reintegration;Neuromuscular re-education;Psychosocial support;Splinting/orthotics;UE/LE Strength taining/ROM OT Self Feeding Anticipated Outcome(s): S OT Basic Self-Care Anticipated Outcome(s): S OT Toileting Anticipated Outcome(s): S OT Bathroom Transfers Anticipated Outcome(s): S OT Recommendation Patient destination: Home Follow Up Recommendations: Home health OT Equipment Recommended: 3 in 1 bedside comode;Tub/shower bench;To be determined   Skilled Therapeutic Intervention 1:1. Pt received in bed with 10/10 pain in scrotum. RN aware. Pt very verbose throughout session and insistent that he knows how to do everything, he isnt able to because he is in pain. OT continues to educate on role/purpose of OT, CIR, ELOS, edema management, and goal setting. Pt completes bathing and oral care with total A for LB bathing and washing back. Set up for oral care. Pt dons new hospital gown with mod A for  fastening in back. Pt rolls using bed rails with MOD A while OT washes back and buttocks and fastens gown in behind. Pt sits EOB with MAX A pulling up on bed rails and OT despite cues for sequencing. Pt requires significantly long rest breaks d/t pain with VC for deep breathing after all mobility. Exited session with pt seate din bed,exit alarm on and call light in reach and RN in room  OT Evaluation Precautions/Restrictions  Precautions Precautions: Fall;Other (comment) Precaution Comments: L BKA; prosthetic is in room Restrictions Weight Bearing Restrictions: No General Chart Reviewed: Yes Family/Caregiver Present: No Vital Signs Therapy Vitals Temp: 98.5 F (36.9 C) Pulse Rate: 87 Resp: 20 BP: 132/66 Patient Position (if appropriate): Lying Oxygen Therapy SpO2: 98 % O2 Device: Room Air Pain Pain Assessment Pain Score: 10-Worst pain ever Pain Location: Scrotum Home Living/Prior Functioning Home Living Available Help at Discharge: Family Type of Home: House Home Access: Stairs to enter Technical brewer of Steps: 1 Home Layout: Two level, Able to live on main level with bedroom/bathroom Prior Function Comments: Pt reports he used WC for primary mobility since swelling started, and was able to transfer independently. Prior to onset of swelling, pt able to ambulate independently with use of prosthetic.  ADL   Vision Baseline Vision/History: Wears glasses Wears Glasses: At all times Vision Assessment?: No apparent visual deficits Perception  Perception: Within Functional Limits Praxis Praxis: Intact Cognition Overall Cognitive Status: Within Functional Limits for tasks assessed Orientation Level: Person;Place;Situation Person: Oriented Place: Oriented Situation: Oriented Year: 2020 Month: July Day of Week: Incorrect Memory: Appears intact Immediate Memory Recall: Sock;Blue;Bed Memory Recall Sock: Without Cue Memory Recall Blue: Without Cue Memory Recall  Bed: With Cue Awareness: Appears intact Problem Solving: Appears intact Safety/Judgment: Impaired Sensation Sensation Light Touch: Impaired Detail Light Touch Impaired Details: Impaired RLE Coordination Gross Motor Movements are Fluid and Coordinated: Yes Fine Motor Movements are Fluid and Coordinated: Yes Motor  Motor Motor: Within Functional Limits Mobility  Bed Mobility Bed Mobility: Rolling Right;Rolling Left;Right Sidelying to  Sit Rolling Right: Maximal Assistance - Patient 25-49% Rolling Left: Maximal Assistance - Patient 25-49% Right Sidelying to Sit: Maximal Assistance - Patient 25-49%  Trunk/Postural Assessment  Cervical Assessment Cervical Assessment: Exceptions to WFL(head forward) Thoracic Assessment Thoracic Assessment: Exceptions to WFL(rounded shoulders) Lumbar Assessment Lumbar Assessment: Exceptions to WFL(posterior pelvic tilt) Postural Control Postural Control: Deficits on evaluation Righting Reactions: delayed Protective Responses: delayed  Balance Static Sitting Balance Static Sitting - Balance Support: Bilateral upper extremity supported Static Sitting - Level of Assistance: 2: Max assist Static Sitting - Comment/# of Minutes: difficulty flexing hips d/t scrotal edema and pain; only able to maintain position for 30 seconds Extremity/Trunk Assessment RUE Assessment RUE Assessment: Within Functional Limits General Strength Comments: swollen LUE Assessment LUE Assessment: Within Functional Limits General Strength Comments: swollen     Refer to Care Plan for Long Term Goals  Recommendations for other services: None    Discharge Criteria: Patient will be discharged from OT if patient refuses treatment 3 consecutive times without medical reason, if treatment goals not met, if there is a change in medical status, if patient makes no progress towards goals or if patient is discharged from hospital.  The above assessment, treatment plan, treatment  alternatives and goals were discussed and mutually agreed upon: by patient  Tonny Branch 02/09/2019, 7:25 AM

## 2019-02-09 NOTE — Progress Notes (Signed)
Inpatient Rehabilitation  Patient information reviewed and entered into eRehab system by Amro Winebarger M. Iyesha Such, M.A., CCC/SLP, PPS Coordinator.  Information including medical coding, functional ability and quality indicators will be reviewed and updated through discharge.    

## 2019-02-09 NOTE — Progress Notes (Signed)
Was given report by 71M nurse, Michael Riggs. Pt came from 70M to 49M @2000 . RN called ED registration top get pt into system but they were unable to do so bc pt was apparently discharged to home on 7/18 and was no longer in epic. Was instructed to call bed placement, which I did, and they  called ED registration to resolve issue. They got in contact with my charge nurse, and RN was informed  to call [phone] for Michael Riggs, the [facility]ad nurse. RN spoke with Michael Riggs and told her the events that led up to this, and she made call to Michael Riggs who works floor coverage for [facility]. Michael Riggs called RN and was told the situation. Was told that problem should be resolved when pt gets new medical record number. RN to call night shift doctor to get pt admitted but doctor was unable to do so since she wasn't the original one to put in orders. RN called doctor who was on day shift about the issue, and he was unable to get pt admitted bc couldn't find pt in epic. Charge nurse got involved again and made several calls. Was told to call [phone] to get in contact with Michael Riggs who is the team lead for pt access for 3rd shift. Michael Riggs was able to get pt into system @0000 . Michael Riggs had RN recount the entire night of events to possible understand what went wrong with the admission process. Will get in contact with RN if any more questions needed to be answered. Charge nurse is aware of the events  Pt is fine and resting, albeit irritated because of the delay in care. Will continue to monitor.

## 2019-02-09 NOTE — Progress Notes (Signed)
Orthopedic Tech Progress Note Patient Details:  Michael Riggs 06-Jul-1971 [id number] Called in order to [name] for patient [name], male   DOB: July 12, 1971, 48 y.o.   MRN: [id number]   Michael Riggs 02/09/2019, 9:31 AM

## 2019-02-09 NOTE — Care Management (Signed)
[place] Individual Statement of Services  Patient Name:  Michael Riggs  Date:  02/09/2019  Welcome to the [place].  Our goal is to provide you with an individualized program based on your diagnosis and situation, designed to meet your specific needs.  With this comprehensive rehabilitation program, you will be expected to participate in at least 3 hours of rehabilitation therapies Monday-Friday, with modified therapy programming on the weekends.  Your rehabilitation program will include the following services:  Physical Therapy (PT), Occupational Therapy (OT), 24 hour per day rehabilitation nursing, Therapeutic Recreaction (TR), Neuropsychology, Case Management (Social Worker), Rehabilitation Medicine, Nutrition Services and Pharmacy Services  Weekly team conferences will be held on Wednesdays to discuss your progress.  Your Social Worker will talk with you frequently to get your input and to update you on team discussions.  Team conferences with you and your family in attendance may also be held.  Expected length of stay: 10-14 days   Overall anticipated outcome: supervision @ wheelchair  Depending on your progress and recovery, your program may change. Your Social Worker will coordinate services and will keep you informed of any changes. Your Social Worker's name and contact numbers are listed  below.  The following services may also be recommended but are not provided by the [place] will be made to provide these services after discharge if needed.  Arrangements include referral to agencies that provide these services.  Your insurance has been verified to be:  [facility] Medicare; Medicaid Your primary doctor is:  Joylene Igo  Pertinent information will be shared with your doctor and your insurance  company.  Social Worker:  [place], [place] or [phone]   Information discussed with and copy given to patient by: Lennart Pall, 02/09/2019, 3:57 PM

## 2019-02-09 NOTE — Patient Outreach (Signed)
[facility]) Care Management  02/09/2019  Rushawn Capshaw [birth year] [id number]   Medication Adherence call to Mr. Katrina Brosh patient was admitted to the hospital patient is showing past due under [facility] ins.  [place] Management Direct Dial [phone]  Fax [phone] [email or web address]

## 2019-02-09 NOTE — Progress Notes (Signed)
Michael Blake, MD  Physician  Physical Medicine and Rehabilitation  PMR Pre-admission  Signed  Date of Service:  02/06/2019 4:55 PM      Related encounter: Admission (Discharged) from 02/04/2019 in [facility]         PMR Admission Coordinator Pre-Admission Assessment  Patient: Michael Riggs is an 48 y.o., male MRN: [id number] DOB: [birth year] Height: 5' 10" (177.8 cm) Weight: (!) 139.7 kg  Insurance Information HMO: yes    PPO:      PCP:      IPA:      80/20:      OTHER: PRIMARY: UHC Medicare      Policy#: [id number]      Subscriber: Patient CM Name: Michael Riggs      Phone#: [phone] [id number]     Fax#: [phone] Pre-Cert#: [id number]      Employer:  [employer] provided by Michael Riggs on 7/22 for admit to CIR. Pt is approved for 7 days (7/22-7/28). Follow up CM is Princess (p): [phone] Benefits:  Phone #: online     Name: [email or web address] Eff. Date: 01/18/2019     Deduct: $0      Out of Pocket Max: $4,500 [phone] met)      Life Max: NA CIR: $325/day co pay for days 1-5, $0/day for co-pay for days 6+      SNF: $0/day co pay for days 1-20, $160/day co-pay for days 21-49, $0/day co-pay for days 50-100; limited to 100 days/cal yr.  Outpatient: limited by medical necessity     Co-Pay: $35/visit Home Health: 100%; limited by medical necessity       Co-Pay: 0% DME: 80%     Co-Pay: 20% Providers:  SECONDARY: Medicaid [place]      Policy#: [id number]      Subscriber: Patient **Pt is QMB ONLY Coverage Code: MQBQN CM Name:       Phone#:      Fax#:  Pre-Cert#:       Employer:  Benefits:  Phone #: verified via automated system: [phone]     Name: automated system Eff. Date: verified eligibility on 02/07/2019     Deduct:       Out of Pocket Max:       Life Max:  CIR:       SNF:  Outpatient:      Co-Pay:  Home Health:       Co-Pay:  DME:      Co-Pay:   Medicaid Application Date:       Case Manager:  Disability Application  Date:       Case Worker:   The "Data Collection Information Summary" for patients in Inpatient Rehabilitation Facilities with attached "Privacy Act [place] Records" was provided and verbally reviewed with: Patient  Emergency Contact Information         Contact Information    Name Relation Home Work Mobile   Michael Riggs Brother [phone]     Michael Riggs, Horsey Mother [phone]        Current Medical History  Patient Admitting Diagnosis: Debility History of Present Illness: [name] is a 48 year old male history of syphilis, anemia of chronic disease, chronic kidney disease with recent dialysis initiated, diabetes mellitus, morbid obesity with BMI of 43.78, hypertension and left BKA 5 years ago in [facility]. Recent admission 01/30/2019 to 02/03/2019 for uremia with acute kidney injury receiving tunneled dialysis catheter 02/02/2019 and was to begin outpatient hemodialysis  at [facility] clinic Tuesday Thursday Saturday. Patient presented 02/04/2019 with increasing shortness of breath as well as scrotal edema. Chest x-ray showed mild vascular congestion. There was noted skin tears of the right lower extremity patient had noted fall since recent discharge home. Hemoglobin 7.9, creatinine 8.82, calcium 6.3, COVID negative. Hemodialysis again follow-up and did initially receive an extra treatment of hemodialysis for suspect volume overload.WOCfollow-up for wounds to right lower leg with dressing changes as directed. Subcutaneous heparin for DVT prophylaxis. Therapy evaluations completed with recommendations made for CIR. Patient is to be admitted for a comprehensive rehab program on 02/08/2019.    Patient's medical record from [facility] has been reviewed by the rehabilitation admission coordinator and physician.  Past Medical History      Past Medical History:  Diagnosis Date  . Anemia   . Chronic kidney disease     Stage 5  . Depression   . Diabetes mellitus without complication ([place])   . Dyspnea   . GERD (gastroesophageal reflux disease)   . Hypertension     Family History   family history is not on file.  Prior Rehab/Hospitalizations Has the patient had prior rehab or hospitalizations prior to admission? Yes  Has the patient had major surgery during 100 days prior to admission? Yes             Current Medications  Current Facility-Administered Medications:  .  acetaminophen (TYLENOL) tablet 650 mg, 650 mg, Oral, Q6H PRN, 650 mg at 02/06/19 0848 **OR** acetaminophen (TYLENOL) suppository 650 mg, 650 mg, Rectal, Q6H PRN, Rise Patience, MD .  albuterol (PROVENTIL) (2.5 MG/3ML) 0.083% nebulizer solution 3 mL, 3 mL, Inhalation, Q6H PRN, Rise Patience, MD .  amLODipine (NORVASC) tablet 10 mg, 10 mg, Oral, Daily, Rise Patience, MD, 10 mg at 02/08/19 0913 .  atorvastatin (LIPITOR) tablet 40 mg, 40 mg, Oral, Daily, Rise Patience, MD, 40 mg at 02/08/19 0913 .  calcitRIOL (ROCALTROL) capsule 0.25 mcg, 0.25 mcg, Oral, Daily, Finnigan, Nancy A, DO, 0.25 mcg at 02/08/19 0913 .  calcium carbonate (TUMS - dosed in mg elemental calcium) chewable tablet 200 mg of elemental calcium, 1 tablet, Oral, BID WC, Alekh, Kshitiz, MD, 200 mg of elemental calcium at 02/08/19 1655 .  Chlorhexidine Gluconate Cloth 2 % PADS 6 each, 6 each, Topical, Q0600, Finnigan, Nancy A, DO, 6 each at 02/07/19 [id number] .  Chlorhexidine Gluconate Cloth 2 % PADS 6 each, 6 each, Topical, Q0600, Rexene Agent, MD, 6 each at 02/08/19 0600 .  citalopram (CELEXA) tablet 40 mg, 40 mg, Oral, Daily, Rise Patience, MD, 40 mg at 02/08/19 0913 .  collagenase (SANTYL) ointment, , Topical, Daily, Alekh, Kshitiz, MD .  Darbepoetin Alfa (ARANESP) injection 100 mcg, 100 mcg, Intravenous, Q Tue-HD, Finnigan, Nancy A, DO, 100 mcg at 02/07/19 1546 .  ferric gluconate (NULECIT) 62.5 mg in sodium chloride 0.9 % 100 mL  IVPB, 62.5 mg, Intravenous, Q T,Th,Sa-HD, Finnigan, Nancy A, DO, Last Rate: 105 mL/hr at 02/07/19 1500, 62.5 mg at 02/07/19 1500 .  gabapentin (NEURONTIN) capsule 300 mg, 300 mg, Oral, QHS, Rise Patience, MD, 300 mg at 02/07/19 2144 .  guaiFENesin-dextromethorphan (ROBITUSSIN DM) 100-10 MG/5ML syrup 10 mL, 10 mL, Oral, Q4H PRN, Alekh, Kshitiz, MD, 10 mL at 02/06/19 0930 .  heparin injection 5,000 Units, 5,000 Units, Subcutaneous, Q8H, Rise Patience, MD, 5,000 Units at 02/08/19 1259 .  hydrOXYzine (ATARAX/VISTARIL) tablet 25 mg, 25 mg, Oral, Q8H PRN, Gean Birchwood  N, MD, 25 mg at 02/08/19 0913 .  Melatonin TABS 4.5 mg, 4.5 mg, Oral, QHS, Rise Patience, MD, 4.5 mg at 02/07/19 2144 .  pantoprazole (PROTONIX) EC tablet 40 mg, 40 mg, Oral, Daily, Rise Patience, MD, 40 mg at 02/08/19 0913 .  polyethylene glycol (MIRALAX / GLYCOLAX) packet 17 g, 17 g, Oral, Daily, Alma Friendly, MD, 17 g at 02/08/19 1118 .  senna-docusate (Senokot-S) tablet 1 tablet, 1 tablet, Oral, BID, Alma Friendly, MD, 1 tablet at 02/08/19 1118 .  traMADol (ULTRAM) tablet 50 mg, 50 mg, Oral, Q6H PRN, Starla Link, Kshitiz, MD, 50 mg at 02/08/19 0913  Patients Current Diet:     Diet Order                  Diet renal/carb modified with fluid restriction Diet-HS Snack? Nothing; Fluid restriction: 1200 mL Fluid; Room service appropriate? Yes; Fluid consistency: Thin  Diet effective now               Precautions / Restrictions Precautions Precautions: Fall, Other (comment) Precaution Comments: L BKA; prosthetic is in room Restrictions Weight Bearing Restrictions: No   Has the patient had 2 or more falls or a fall with injury in the past year? Yes  Prior Activity Level Community (5-7x/wk): would go out of apartment to get food for himself and his mother; able to walk with prothesis without AD; but recently had to use wc since recent decline  Prior Functional Level Self  Care: Did the patient need help bathing, dressing, using the toilet or eating? Independent  Indoor Mobility: Did the patient need assistance with walking from room to room (with or without device)? Independent  Stairs: Did the patient need assistance with internal or external stairs (with or without device)? Dependent  Functional Cognition: Did the patient need help planning regular tasks such as shopping or remembering to take medications? Independent  Home Assistive Devices / Equipment Home Assistive Devices/Equipment: CBG Meter, Wheelchair Home Equipment: Wheelchair - manual  Prior Device Use: Indicate devices/aids used by the patient prior to current illness, exacerbation or injury? no AD for ambulation (besides prothesis); has wc at home as needed (has had to use more recently, but unsure if he fits now)  Current Functional Level Cognition  Overall Cognitive Status: Within Functional Limits for tasks assessed Orientation Level: Oriented X4 General Comments: Participating well    Extremity Assessment (includes Sensation/Coordination)  Upper Extremity Assessment: Generalized weakness  Lower Extremity Assessment: Defer to PT evaluation RLE Deficits / Details: swelling RLE Sensation: decreased light touch LLE Deficits / Details: increased swelling    ADLs  Overall ADL's : Needs assistance/impaired Eating/Feeding: Set up, Bed level Grooming: Set up, Bed level, Wash/dry hands, Wash/dry face Grooming Details (indicate cue type and reason): bed more in chair position Upper Body Bathing: Set up, Bed level Lower Body Bathing: Maximal assistance, Bed level Lower Body Bathing Details (indicate cue type and reason): unable to tolerate movement of LB for scrotal sling Upper Body Dressing : Set up, Sitting Lower Body Dressing: Maximal assistance, Bed level Lower Body Dressing Details (indicate cue type and reason): max A to don Scrotal sling in supine Toilet Transfer: Maximal  assistance, +2 for physical assistance, +2 for safety/equipment Toileting- Clothing Manipulation and Hygiene: Maximal assistance, +2 for physical assistance, +2 for safety/equipment, Bed level Toileting - Clothing Manipulation Details (indicate cue type and reason): pt reports using bed pain - severe pain Functional mobility during ADLs: Total assistance General ADL Comments:  set-upA for UB ADL and severe pain- pt unable to tolerate movement of LB for scrotal sling    Mobility  Overal bed mobility: Needs Assistance Bed Mobility: Supine to Sit, Sit to Supine Rolling: Mod assist Supine to sit: Max assist, +2 for physical assistance Sit to supine: Max assist, +2 for physical assistance, +2 for safety/equipment General bed mobility comments: max A +2 today due to pain, tolerated short duration of sitting EOB with posterior lean    Transfers  General transfer comment: unable today due to pain, patient reports eagerness to attempt     Ambulation / Gait / Stairs / Office manager / Balance Dynamic Sitting Balance Sitting balance - Comments: Tending to lean back with difficulty flexing hips to lean forward due to scrotal edema and general body habitus; able to reach and support self with bil UE support on seated surface and holding foot board; very uncomfortable, and requested to lay back down Balance Overall balance assessment: Needs assistance Sitting balance-Leahy Scale: Poor(approaching Fair) Sitting balance - Comments: Tending to lean back with difficulty flexing hips to lean forward due to scrotal edema and general body habitus; able to reach and support self with bil UE support on seated surface and holding foot board; very uncomfortable, and requested to lay back down Postural control: Posterior lean    Special needs/care consideration BiPAP/CPAP : reports use of CPAP during hospital admission  CPM : no Continuous Drip IV : no Dialysis : HD (new start -please  call Terri Piedra prior to DC to get seat set up (7am start time)       Days : T/TH/Sat Life Vest : no Oxygen : no Special Bed : no Trach Size : no Wound Vac (area) : no      Location : no Skin : abrasion to right anterior (multiple open wounds) and posterior foot, amputation to LLE, serous blister to right leg, MASD to lower mid abdomen, rash to mid left chest; swollen, enlarged scrotum                    Bowel mgmt: continent, last BM: 02/05/2019 Bladder mgmt: oliguria  Diabetic mgmt: yes Behavioral consideration : can be inappropriate at times (makes adult jokes often) Chemo/radiation : no   Previous Home Environment (from acute therapy documentation) Living Arrangements: Parent Available Help at Discharge: Family Type of Home: House Home Layout: Two level, Able to live on main level with bedroom/bathroom Home Access: Stairs to enter Entrance Stairs-Number of Steps: 1 Home Care Services: Yes Type of Home Care Services: [place] (if known): unable to recollect Additional Comments: Has prosthetic but reports it is at home. Reports he has not fit in prosthetic lately secondary to swelling.   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, food delivery, supervision)) Contact Information: main contact: sister in law Arrie Aran): [phone] (pt's brother and mother do not speak english and this is pt's preferred contact for exchange of  information) Anticipated Caregiver: Sister in law ([place]), brother ([place]), neice, other family members Anticipated Ambulance person  Information: see above Ability/Limitations of Caregiver: Min A Caregiver Availability: 24/7 Discharge Plan Discussed with Primary Caregiver: Yes Is Caregiver In Agreement with Plan?: Yes Does Caregiver/Family have Issues with Lodging/Transportation while Pt is in Rehab?: No  Goals/Additional Needs Patient/Family Goal for Rehab: PT/OT: Min A; SLP: NA Expected length of stay: 10-14 days Cultural Considerations: NA Dietary Needs: renal/carb modified with fluid restriction; Diet-HS snack: nothing; fluid restriction 1200 mL fluid. Fluid consistency: thin.  Equipment Needs: TBD Special Service Needs: HD: T/Th/Sat -has been clipped -contact Terri Piedra for details Pt/Family Agrees to Admission and willing to participate: Yes Program Orientation Provided & Reviewed with Pt/Caregiver Including Roles  & Responsibilities: Yes(with pt's daughter in law (dawn) who is to translate for fam)  Barriers to Discharge: Medical stability, Incontinence, Weight, Hemodialysis, Weight bearing restrictions  Barriers to Discharge Comments: will need to be able to stand and pivot with min G/min A; has not yet started OP HD, old BKA on LLE-hoping to fit into prothesis once swelling goes down to assist with transfers  Decrease burden of Care through IP rehab admission: NA  Possible need for SNF placement upon discharge: Not anticipated. Pt has necessary support at DC to support short CIR stay. Pt has wc accessible DC environment to support safe return home.   Patient Condition: I have reviewed medical records from [facility], spoken with Terri Piedra (Dialysis coordinator), and patient and family member. I met with patient at the bedside for inpatient rehabilitation assessment.  Patient will benefit from ongoing PT and OT, can actively participate in 3 hours of  therapy a day 5 days of the week, and can make measurable gains during the admission.  Patient will also benefit from the coordinated team approach during an Inpatient Acute Rehabilitation admission.  The patient will receive intensive therapy as well as Rehabilitation physician, nursing, social worker, and care management interventions.  Due to bladder management, bowel management, safety, skin/wound care, disease management, medication administration, pain management and patient education the patient requires 24 hour a day rehabilitation nursing.  The patient is currently Max A x2 with bed mobility and Max A x2 for bed level toileting ADLs.  Discharge setting and therapy post discharge at home with home health is anticipated.  Patient has agreed to participate in the Acute Inpatient Rehabilitation Program and will admit 02/08/2019.  Preadmission Screen Completed By:  Jhonnie Garner, 02/08/2019 4:59 PM ______________________________________________________________________   Discussed status with Dr. Letta Pate on 02/08/2019 at 5:00PM and received approval for admission today.  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 Does the need for close, 24 hr/day Medical supervision in concert with the patient's rehab needs make it unreasonable for this patient to be served in a less intensive setting? Yes 2. Co-Morbidities requiring supervision/potential complications: Right BKA, severe scrotal edema 3. Due to bladder management, bowel management, safety, skin/wound care, disease management, medication administration, pain management and patient education, does the patient require 24 hr/day rehab nursing? Yes 4. Does the patient require coordinated care of a physician, rehab nurse, PT (1-2 hrs/day, 5 days/week) and OT (1-2 hrs/day, 5 days/week) to address physical and functional deficits in the context of the above medical diagnosis(es)?  Yes Addressing deficits in the following areas: balance, endurance, locomotion, strength, transferring, bowel/bladder control, bathing, dressing, toileting and psychosocial support 5. Can the patient actively participate in an intensive therapy program of at least 3 hrs of therapy 5 days a week? Yes 6.  The potential for patient to make measurable gains while on inpatient rehab is good 7. Anticipated functional outcomes upon discharge from inpatients are: min assist PT, min assist OT, n/a SLP 8. Estimated rehab length of stay to reach the above functional goals is: 10-14d 9. Anticipated D/C setting: Home 10. Anticipated post D/C treatments: [place] therapy 11. Overall Rehab/Functional Prognosis: good  MD Signature: Michael Riggs M.D. [place] FAAPM&R (Sports Med, Neuromuscular Med) Diplomate Am Board of Electrodiagnostic Med         Revision History Date/Time User Provider Type Action  02/08/2019 6:58 PM Kirsteins, Luanna Salk, MD Physician Sign  02/08/2019 5:02 PM Jhonnie Garner, OT Rehab Admission Coordinator Share  02/08/2019 5:01 PM Jhonnie Garner, [place] Rehab Admission Coordinator Share  View Details Report

## 2019-02-09 NOTE — Progress Notes (Signed)
Admit: 02/08/2019 LOS: 1  54M recent start ESRD (at [facility]) admitted with hypervolemia, poor physical stamina  Subjective:  . Now in CIR  07/22 0701 - 07/23 0700 In: 70  Out: -   Filed Weights   02/09/19 0510  Weight: (!) 145 kg    Scheduled Meds: . amLODipine  10 mg Oral Daily  . atorvastatin  40 mg Oral Daily  . calcitRIOL  0.25 mcg Oral Daily  . calcium carbonate  1 tablet Oral BID WC  . citalopram  40 mg Oral Daily  . collagenase   Topical Daily  . [START ON 02/14/2019] darbepoetin (ARANESP) injection - DIALYSIS  100 mcg Intravenous Q Tue-HD  . gabapentin  300 mg Oral QHS  . heparin  5,000 Units Subcutaneous Q8H  . Melatonin  4.5 mg Oral QHS  . pantoprazole  40 mg Oral Daily   Continuous Infusions: . [START ON 02/11/2019] ferric gluconate (FERRLECIT/NULECIT) IV     PRN Meds:.acetaminophen **OR** acetaminophen, albuterol, heparin, heparin, hydrOXYzine, sorbitol, traMADol  Current Labs: reviewed   Physical Exam:  Blood pressure 132/66, pulse 87, temperature 98.5 F (36.9 C), resp. rate 20, height 5\' 10"  (1.778 m), weight (!) 145 kg, SpO2 98 %. Pleasant nad RRR nl s1s2 L 1st stage BC AVF +B/T 3+ LEE, pitting in RLE, stump swollen CTAB Nonfocal, AAO x3 EOMI  A 1. ESRD, new, for [facility] THS upon DC 2. HTN / Vol: sig edema, cont to push down post HD weights 3. S/p L BC AVF, will need 2nd stag,e using TDC 4. Ambulatory dysfunction, PT/OT, in CIR 5. CKD BMD: cont C3, PTH ok 6. Anemia: aranesp 100 qTues, CTM  P . HD today on THS schedule: 5L UF, TDC, 4h, 2K, Tight heparin . Medication Issues; o Preferred narcotic agents for pain control are hydromorphone, fentanyl, and methadone. Morphine should not be used.  o Baclofen should be avoided o Avoid oral sodium phosphate and magnesium citrate based laxatives / bowel preps    Pearson Grippe MD 02/09/2019, 11:27 AM  Recent Labs  Lab 02/03/19 0342  02/06/19 0742 02/07/19 0034 02/08/19 0622  NA 134*   < > 131* 129* 130*   K 4.0   < > 3.9 3.9 4.0  CL 100   < > 96* 96* 97*  CO2 24   < > 25 24 24   GLUCOSE 126*   < > 106* 168* 137*  BUN 50*   < > 40* 27* 22*  CREATININE 8.04*   < > 7.31* 5.20* 4.48*  CALCIUM 6.4*   < > 6.5* 6.8* 7.1*  PHOS 4.2  --  3.7  --   --    < > = values in this interval not displayed.   Recent Labs  Lab 02/06/19 0636 02/07/19 0034 02/08/19 0622  WBC 15.2* 14.8* 14.0*  NEUTROABS 11.9* 11.3* 9.7*  HGB 7.5* 7.0* 7.0*  HCT 24.4* 22.9* 22.8*  MCV 98.4 96.6 98.7  PLT 150 120* 138*

## 2019-02-09 NOTE — Progress Notes (Signed)
Patient given additional dose of sorbitol per instruction by Judeth Porch due to constipation with reported LBM-7/19. Dan Angiulli-PA notified of patient's inability to void with bladder scan greater than 600 ml. Multiple attempts made to catheterize patient with no success. Urology contacted per Linna Hoff, awaiting arrival at this time. Pt continues to c/o pain to scrotum, perineal, and abdomen areas, given tylenol and ultram x1 with minimal effects. Pt presently off unit at dialysis.

## 2019-02-09 NOTE — Progress Notes (Signed)
Social Work Assessment and Plan   Patient Details  Name: Michael Riggs MRN: [id number] Date of Birth: [birth year]  Today's Date: 02/09/2019  Problem List:  Patient Active Problem List   Diagnosis Date Noted  . Debility 02/09/2019  . Fluid overload 02/04/2019  . Hyperkalemia 01/30/2019  . ARF (acute renal failure) ([place]) 01/30/2019  . Hypokalemia 01/30/2019  . Hypoglycemia 01/30/2019  . Syphilis 01/30/2019  . Macrocytic anemia 01/30/2019  . ESRD (end stage renal disease) ([place])   . PDR (proliferative diabetic retinopathy) ([place]) 12/14/2013  . Venous hypertension 09/22/2013  . Diabetes mellitus type 2 in obese ([place]) 07/04/2013  . Habitual alcohol use 07/04/2013  . Obesity, Class II, BMI 35-39.9, with comorbidity 07/04/2013   Past Medical History:  Past Medical History:  Diagnosis Date  . Anemia   . Chronic kidney disease    Stage 5  . Depression   . Diabetes mellitus without complication ([place])   . Dyspnea   . GERD (gastroesophageal reflux disease)   . Hypertension    Past Surgical History:  Past Surgical History:  Procedure Laterality Date  . AV FISTULA PLACEMENT Left 01/09/2019   Procedure: ARTERIOVENOUS (AV) FISTULA CREATION LEFT UPPER ARM;  Surgeon: [name], MD;  Location: [facility] OR;  Service: Vascular;  Laterality: Left;  . below the knee amputation Left   . EYE SURGERY    . IR FLUORO GUIDE CV LINE RIGHT  01/31/2019  . IR FLUORO GUIDE CV LINE RIGHT  02/03/2019  . IR US GUIDE VASC ACCESS RIGHT  01/31/2019  . IR US GUIDE VASC ACCESS RIGHT  02/03/2019   Social History:  reports that he quit smoking about 3 weeks ago. His smoking use included cigarettes. He started smoking about 2 months ago. He has never used smokeless tobacco. He reports previous alcohol use. He reports previous drug use.  Family / Support Systems Marital Status: Single Patient Roles: Other (Comment)(has local mother and siblings) Other Supports: mother, Daylon Lafavor, living in the home with pt  but is not able to provide assistance. Anticipated Caregiver: Sister in law Engineer, manufacturing systems), brother (Moises), neice, other family members Ability/Limitations of Caregiver: Min A Caregiver Availability: 24/7 Family Dynamics: Pt describes brother and sister-in-law as supportive.  Social History Preferred language: English Religion:  Cultural Background: NA Read: Yes Write: Yes Employment Status: Disabled Public relations account executive Issues: None Guardian/Conservator: None - per MD, pt is capable of making decisions on his own behalf.   Abuse/Neglect Abuse/Neglect Assessment Can Be Completed: Yes Physical Abuse: Denies Verbal Abuse: Denies Sexual Abuse: Denies Exploitation of patient/patient's resources: Denies Self-Neglect: Denies  Emotional Status Pt's affect, behavior and adjustment status: Pt lying in bed and c/o much pain "all over" but agreeable to complete assessment interview.  Pt clearly in pain with grimacing and strained voicing of answers at times.  Offered to come back, however, pt insists on "let's get this done."  Pt expressing frustration that he feels medical staff "don't believe" the level of pain he is having.  He is grateful to have "someone actually listen to me."  Provided encouragement, however, will need to monitor mood while here.  Hopeful that medical issues and swelling will improve and pain will be more controlled. Recent Psychosocial Issues: Caring for his mother;  medical issues/ decline. Psychiatric History: None Substance Abuse History: None  Patient / Family Perceptions, Expectations & Goals Pt/Family understanding of illness & functional limitations: Pt with good, general understanding of his medical issues.  Feels that the excessive swelling  is the primary barrier to his mobility and cause of his pain. Premorbid pt/family roles/activities: Pt was independent overall.  Assisting mother with home management, meals, etc. Anticipated changes in  roles/activities/participation: Awaiting goals from team.  Brother and sister-in-law likely to be primary caregivers. Pt/family expectations/goals: "I want the pain to stop."  [employer]: Other (Comment)(new pt and Reader) Transportation available at discharge: yes - per pt, need to confirm Resource referrals recommended: Neuropsychology  Discharge Planning Living Arrangements: Parent Support Systems: Other relatives Type of Residence: Private residence Insurance Resources: Medicare, [place] (specify county) Pensions consultant: SSD, SSI Financial Screen Referred: No Living Expenses: Education officer, community Management: Patient Does the patient have any problems obtaining your medications?: No Home Management: pt primarily responsible Patient/Family Preliminary Plans: Pt to return to his own home with family to assist as needed. Social Work Anticipated Follow Up Needs: HH/OP Expected length of stay: 10-14 days  Clinical Impression Very unfortunate gentleman here for debility r/t CKD, new HD pt ad volume overload causing significant pain and physical movement restriction.  He is able to complete interview, however, obvious that he is experiencing pain throughout.  Good family support.  Awaiting goals of team and need to confirm if they can truly provide 24/7 support.  Will follow for d/c planning needs.  Deshane Cotroneo 02/09/2019, 3:53 PM

## 2019-02-09 NOTE — H&P (Signed)
Physical Medicine and Rehabilitation Admission H&P     Chief complaint: Weakness HPI: Michael Haro. Salguero is a 48 year old right-handed male history of syphilis, anemia of chronic disease, chronic kidney disease with recent dialysis initiated, diabetes mellitus, morbid obesity with BMI of 43.78, hypertension and left BKA 5 years ago in [facility].  Per chart review patient lives with his elderly mother.  Reportedly independent with prosthesis and wheelchair prior to admission.  1 level home.  Mother is limited physically and receives CAPS assistance for herself 5 hours a day.  He has a brother that works nights.  Recent admission 01/30/2019 to 02/03/2019 for uremia with acute kidney injury receiving tunneled dialysis catheter 02/02/2019 and was to begin outpatient hemodialysis at [facility] clinic Tuesday Thursday Saturday.  Patient presented 02/04/2019 with increasing shortness of breath as well as scrotal edema.  Chest x-ray showed mild vascular congestion.  There was noted skin tears of the right lower extremity patient had noted fall since recent discharge home.  Hemoglobin 7.9, creatinine 8.82, calcium 6.3, COVID negative.  Hemodialysis again follow-up and did initially receive an extra treatment of hemodialysis for suspect volume overload.WOC follow-up for wounds to right lower leg with dressing changes as directed.  Subcutaneous heparin for DVT prophylaxis.  Therapy evaluations completed and patient was admitted for a comprehensive rehab program.   Review of Systems  Constitutional: Negative for chills and fever.  HENT: Negative for hearing loss.   Eyes: Negative for blurred vision and double vision.  Respiratory: Positive for shortness of breath. Negative for cough and sputum production.   Cardiovascular: Positive for leg swelling. Negative for chest pain and palpitations.  Gastrointestinal: Positive for constipation. Negative for heartburn, nausea and vomiting.       GERD   Genitourinary: Negative for dysuria, flank pain and hematuria.  Musculoskeletal: Positive for falls and myalgias.  Skin: Negative for rash.  Psychiatric/Behavioral: Positive for depression. The patient has insomnia.   All other systems reviewed and are negative.       Past Medical History:  Diagnosis Date  . Anemia    . Chronic kidney disease      Stage 5  . Depression    . Diabetes mellitus without complication ([place])    . Dyspnea    . GERD (gastroesophageal reflux disease)    . Hypertension          Past Surgical History:  Procedure Laterality Date  . AV FISTULA PLACEMENT Left 01/09/2019    Procedure: ARTERIOVENOUS (AV) FISTULA CREATION LEFT UPPER ARM;  Surgeon: [name], MD;  Location: [facility] OR;  Service: Vascular;  Laterality: Left;  . below the knee amputation Left    . EYE SURGERY      . IR FLUORO GUIDE CV LINE RIGHT   01/31/2019  . IR FLUORO GUIDE CV LINE RIGHT   02/03/2019  . IR US GUIDE VASC ACCESS RIGHT   01/31/2019  . IR US GUIDE VASC ACCESS RIGHT   02/03/2019   History reviewed. No pertinent family history. Social History:  reports that he has quit smoking. He has never used smokeless tobacco. He reports previous alcohol use. He reports previous drug use. Allergies: No Known Allergies Medications Prior to Admission  Medication Sig Dispense Refill  . albuterol (VENTOLIN HFA) 108 (90 Base) MCG/ACT inhaler Inhale into the lungs every 6 (six) hours as needed for wheezing or shortness of breath.      Marland Kitchen amLODipine (NORVASC) 10 MG tablet Take 10  mg by mouth daily.      Marland Kitchen atorvastatin (LIPITOR) 40 MG tablet Take 40 mg by mouth daily.       . calcium carbonate (TUMS - DOSED IN MG ELEMENTAL CALCIUM) 500 MG chewable tablet Chew 1 tablet by mouth daily as needed for indigestion or heartburn.       . citalopram (CELEXA) 40 MG tablet Take 40 mg by mouth daily.    0  . gabapentin (NEURONTIN) 300 MG capsule Take 300 mg by mouth 3 (three) times daily.    2  .  guaiFENesin-dextromethorphan (ROBITUSSIN DM) 100-10 MG/5ML syrup Take 10 mLs by mouth every 4 (four) hours as needed for cough.      . hydrOXYzine (ATARAX/VISTARIL) 25 MG tablet Take 25 mg by mouth every 8 (eight) hours as needed for anxiety.    0  . Melatonin 5 MG CAPS Take 1 capsule by mouth at bedtime.      Marland Kitchen omeprazole (PRILOSEC) 40 MG capsule Take 40 mg by mouth daily.      . ondansetron (ZOFRAN) 4 MG tablet Take 4 mg by mouth daily as needed for nausea or vomiting.       Marland Kitchen oxyCODONE-acetaminophen (PERCOCET) 5-325 MG tablet Take 1 tablet by mouth every 6 (six) hours as needed for severe pain. 8 tablet 0  . traMADol (ULTRAM) 50 MG tablet Take by mouth every 6 (six) hours as needed.         Drug Regimen Review Drug regimen was reviewed and remains appropriate with no significant issues identified   Home: Home Living Family/patient expects to be discharged to:: Private residence Living Arrangements: Parent Available Help at Discharge: Family Type of Home: House Home Access: Stairs to enter Technical brewer of Steps: 1 Home Layout: Two level, Able to live on main level with bedroom/bathroom Home Equipment: Wheelchair - manual Additional Comments: Has prosthetic but reports it is at home. Reports he has not fit in prosthetic lately secondary to swelling.    Functional History: Prior Function Level of Independence: Independent with assistive device(s) Comments: Pt reports he used WC for primary mobility since swelling started, and was able to transfer independently. Prior to onset of swelling, pt able to ambulate independently with use of prosthetic.    Functional Status:  Mobility: Bed Mobility Overal bed mobility: Needs Assistance Bed Mobility: Supine to Sit, Sit to Supine Rolling: Mod assist Supine to sit: Mod assist Sit to supine: Mod assist General bed mobility comments: Maintained bil hips in abduction throughout movement because of scrotal edema; Cues for technique;  initiated by using bed pads to scoot hips to EOB; he performed a kind of modified bridging to scoot as well; Heavy mod handheld assist to pull to sit, and Emiel also used bed rails; cues for hand positioning; mod assist for positioning back in teh bed; he is able to hod to head board and assist in scooting up to [place] transfer comment: Hopeful to try next session       ADL: ADL Overall ADL's : Needs assistance/impaired Eating/Feeding: Set up, Bed level Grooming: Set up, Bed level, Wash/dry hands, Wash/dry face Upper Body Bathing: Set up, Bed level Lower Body Bathing: Maximal assistance, Bed level Lower Body Bathing Details (indicate cue type and reason): unable to tolerate movement of LB for scrotal sling Upper Body Dressing : Set up, Sitting Lower Body Dressing: Maximal assistance, Bed level Lower Body Dressing Details (indicate cue type and reason): max A to don Scrotal sling in supine  Toilet Transfer: Total assistance Toileting- Clothing Manipulation and Hygiene: Maximal assistance, +2 for physical assistance, +2 for safety/equipment, Bed level Toileting - Clothing Manipulation Details (indicate cue type and reason): pt reports using bed pain - severe pain Functional mobility during ADLs: Total assistance General ADL Comments: set-upA for UB ADL and severe pain- pt unable to tolerate movement of LB for scrotal sling   Cognition: Cognition Overall Cognitive Status: Within Functional Limits for tasks assessed Orientation Level: Oriented X4 Cognition Arousal/Alertness: Awake/alert Behavior During Therapy: WFL for tasks assessed/performed Overall Cognitive Status: Within Functional Limits for tasks assessed General Comments: Participating well   Physical Exam: Blood pressure 94/64, pulse 86, temperature 98.5 F (36.9 C), temperature source Oral, resp. rate (!) 21, height 5\' 10"  (1.778 m), weight (!) 139.7 kg, SpO2 99 %. Physical Exam  Constitutional: He is  oriented to person, place, and time. No distress.  Morbidly obese  HENT:  Head: Normocephalic and atraumatic.  Eyes: Pupils are equal, round, and reactive to light. EOM are normal.  Neck: Normal range of motion. No thyromegaly present.  Cardiovascular: Normal rate. Exam reveals no friction rub.  No murmur heard. Respiratory: Effort normal. No respiratory distress. He has no wheezes.  GI: Soft. He exhibits no distension. There is no abdominal tenderness.  Musculoskeletal:     Comments: 1+ RLE edema, decreased edema left stump, shrinker on. Scrotum decr to size of large soft ball in size  Neurological: He is alert and oriented to person, place, and time. No cranial nerve deficit. Coordination normal.  UE 5/5. RLE 2-3/5 prox to 3/5 distal. Decreased LT right foot. Good insight and awareness  Skin: Skin is warm. He is not diaphoretic.  Left BKA healed. Skin wound RLE with borken bullae, one remaining bulla on inner thigh. Some other scattered lesions. Callus on patella, tibia  Psychiatric: He has a normal mood and affect. His behavior is normal. Judgment and thought content normal.      Lab Results Last 48 Hours  Results for orders placed or performed during the hospital encounter of 02/04/19 (from the past 48 hour(s))  CBC with Differential/Platelet     Status: Abnormal    Collection Time: 02/06/19  6:36 AM  Result Value Ref Range    WBC 15.2 (H) 4.0 - 10.5 K/uL    RBC 2.48 (L) 4.22 - 5.81 MIL/uL    Hemoglobin 7.5 (L) 13.0 - 17.0 g/dL    HCT 24.4 (L) 39.0 - 52.0 %    MCV 98.4 80.0 - 100.0 fL    MCH 30.2 26.0 - 34.0 pg    MCHC 30.7 30.0 - 36.0 g/dL    RDW 12.6 11.5 - 15.5 %    Platelets 150 150 - 400 K/uL    nRBC 0.0 0.0 - 0.2 %    Neutrophils Relative % 79 %    Neutro Abs 11.9 (H) 1.7 - 7.7 K/uL    Lymphocytes Relative 7 %    Lymphs Abs 1.0 0.7 - 4.0 K/uL    Monocytes Relative 9 %    Monocytes Absolute 1.4 (H) 0.1 - 1.0 K/uL    Eosinophils Relative 3 %    Eosinophils Absolute  0.5 0.0 - 0.5 K/uL    Basophils Relative 0 %    Basophils Absolute 0.1 0.0 - 0.1 K/uL    Immature Granulocytes 2 %    Abs Immature Granulocytes 0.34 (H) 0.00 - 0.07 K/uL      Comment: Performed at [facility], [address] [place], [place] [address]  Renal function panel     Status: Abnormal    Collection Time: 02/06/19  7:42 AM  Result Value Ref Range    Sodium 131 (L) 135 - 145 mmol/L    Potassium 3.9 3.5 - 5.1 mmol/L    Chloride 96 (L) 98 - 111 mmol/L    CO2 25 22 - 32 mmol/L    Glucose, Bld 106 (H) 70 - 99 mg/dL    BUN 40 (H) 6 - 20 mg/dL    Creatinine, Ser 7.31 (H) 0.61 - 1.24 mg/dL    Calcium 6.5 (L) 8.9 - 10.3 mg/dL    Phosphorus 3.7 2.5 - 4.6 mg/dL    Albumin 1.5 (L) 3.5 - 5.0 g/dL    GFR calc non Af Amer 8 (L) >60 mL/min    GFR calc Af Amer 9 (L) >60 mL/min    Anion gap 10 5 - 15      Comment: Performed at [facility], [address] [place], [place] [address]  Glucose, capillary     Status: None    Collection Time: 02/06/19 12:08 PM  Result Value Ref Range    Glucose-Capillary 77 70 - 99 mg/dL  Glucose, capillary     Status: Abnormal    Collection Time: 02/06/19  4:15 PM  Result Value Ref Range    Glucose-Capillary 145 (H) 70 - 99 mg/dL  SARS Coronavirus 2 (CEPHEID - Performed in [facility] hospital lab), Hosp Order     Status: None    Collection Time: 02/06/19  6:10 PM    Specimen: Nasopharyngeal Swab  Result Value Ref Range    SARS Coronavirus 2 NEGATIVE NEGATIVE      Comment: (NOTE) If result is NEGATIVE SARS-CoV-2 target nucleic acids are NOT DETECTED. The SARS-CoV-2 RNA is generally detectable in upper and lower  respiratory specimens during the acute phase of infection. The lowest  concentration of SARS-CoV-2 viral copies this assay can detect is 250  copies / mL. A negative result does not preclude SARS-CoV-2 infection  and should not be used as the sole basis for treatment or other  patient management decisions.  A negative result may  occur with  improper specimen collection / handling, submission of specimen other  than nasopharyngeal swab, presence of viral mutation(s) within the  areas targeted by this assay, and inadequate number of viral copies  (<250 copies / mL). A negative result must be combined with clinical  observations, patient history, and epidemiological information. If result is POSITIVE SARS-CoV-2 target nucleic acids are DETECTED. The SARS-CoV-2 RNA is generally detectable in upper and lower  respiratory specimens dur ing the acute phase of infection.  Positive  results are indicative of active infection with SARS-CoV-2.  Clinical  correlation with patient history and other diagnostic information is  necessary to determine patient infection status.  Positive results do  not rule out bacterial infection or co-infection with other viruses. If result is PRESUMPTIVE POSTIVE SARS-CoV-2 nucleic acids MAY BE PRESENT.   A presumptive positive result was obtained on the submitted specimen  and confirmed on repeat testing.  While 2019 novel coronavirus  (SARS-CoV-2) nucleic acids may be present in the submitted sample  additional confirmatory testing may be necessary for epidemiological  and / or clinical management purposes  to differentiate between  SARS-CoV-2 and other Sarbecovirus currently known to infect humans.  If clinically indicated additional testing with an alternate test  methodology [phone]) is advised. The SARS-CoV-2 RNA is  generally  detectable in upper and lower respiratory sp ecimens during the acute  phase of infection. The expected result is Negative. Fact Sheet for Patients:  StrictlyIdeas.no Fact Sheet for Healthcare Providers: BankingDealers.co.za This test is not yet approved or cleared by the [place] FDA and has been authorized for detection and/or diagnosis of SARS-CoV-2 by FDA under an Emergency Use Authorization (EUA).  This  EUA will remain in effect (meaning this test can be used) for the duration of the COVID-19 declaration under Section 564(b)(1) of the Act, 21 U.S.C. section 360bbb-3(b)(1), unless the authorization is terminated or revoked sooner. Performed at [facility], [place] [address], [place], [place] [address]    Glucose, capillary     Status: Abnormal    Collection Time: 02/06/19  9:23 PM  Result Value Ref Range    Glucose-Capillary 167 (H) 70 - 99 mg/dL  CBC with Differential/Platelet     Status: Abnormal    Collection Time: 02/07/19 12:34 AM  Result Value Ref Range    WBC 14.8 (H) 4.0 - 10.5 K/uL    RBC 2.37 (L) 4.22 - 5.81 MIL/uL    Hemoglobin 7.0 (L) 13.0 - 17.0 g/dL    HCT 22.9 (L) 39.0 - 52.0 %    MCV 96.6 80.0 - 100.0 fL    MCH 29.5 26.0 - 34.0 pg    MCHC 30.6 30.0 - 36.0 g/dL    RDW 12.4 11.5 - 15.5 %    Platelets 120 (L) 150 - 400 K/uL    nRBC 0.0 0.0 - 0.2 %    Neutrophils Relative % 77 %    Neutro Abs 11.3 (H) 1.7 - 7.7 K/uL    Lymphocytes Relative 7 %    Lymphs Abs 1.1 0.7 - 4.0 K/uL    Monocytes Relative 10 %    Monocytes Absolute 1.5 (H) 0.1 - 1.0 K/uL    Eosinophils Relative 4 %    Eosinophils Absolute 0.5 0.0 - 0.5 K/uL    Basophils Relative 0 %    Basophils Absolute 0.1 0.0 - 0.1 K/uL    Immature Granulocytes 2 %    Abs Immature Granulocytes 0.35 (H) 0.00 - 0.07 K/uL      Comment: Performed at [facility], [address], [place], [place] [address]  Basic metabolic panel     Status: Abnormal    Collection Time: 02/07/19 12:34 AM  Result Value Ref Range    Sodium 129 (L) 135 - 145 mmol/L    Potassium 3.9 3.5 - 5.1 mmol/L    Chloride 96 (L) 98 - 111 mmol/L    CO2 24 22 - 32 mmol/L    Glucose, Bld 168 (H) 70 - 99 mg/dL    BUN 27 (H) 6 - 20 mg/dL    Creatinine, Ser 5.20 (H) 0.61 - 1.24 mg/dL    Calcium 6.8 (L) 8.9 - 10.3 mg/dL    GFR calc non Af Amer 12 (L) >60 mL/min    GFR calc Af Amer 14 (L) >60 mL/min    Anion gap 9 5 - 15      Comment:  Performed at [facility] [address] [place], [place] [address]  Magnesium     Status: Abnormal    Collection Time: 02/07/19 12:34 AM  Result Value Ref Range    Magnesium 1.6 (L) 1.7 - 2.4 mg/dL      Comment: Performed at [facility] [address], [place], [place]  [address]  Glucose, capillary     Status: Abnormal    Collection Time: 02/07/19  6:59 AM  Result Value Ref Range    Glucose-Capillary 114 (H) 70 - 99 mg/dL  Glucose, capillary     Status: Abnormal    Collection Time: 02/07/19 11:39 AM  Result Value Ref Range    Glucose-Capillary 114 (H) 70 - 99 mg/dL  Glucose, capillary     Status: Abnormal    Collection Time: 02/07/19  4:54 PM  Result Value Ref Range    Glucose-Capillary 338 (H) 70 - 99 mg/dL  Glucose, capillary     Status: Abnormal    Collection Time: 02/07/19  9:08 PM  Result Value Ref Range    Glucose-Capillary 160 (H) 70 - 99 mg/dL     Imaging Results (Last 48 hours)  No results found.           Medical Problem List and Plan: 1.  Debility secondary to end-stage renal disease/multi-medical.  Hemodialysis recently initiated.             -Patient is beginning CIR therapies today including PT and OT              -will ask [employer] to assess socket, needs new liner 2.  Antithrombotics: -DVT/anticoagulation: Subcutaneous heparin             -antiplatelet therapy: N/A 3. Pain Management: Neurontin 300 mg nightly, Ultram as needed 4. Mood: Melatonin 4.5 mg nightly, Celexa 40 mg daily             -antipsychotic agents: N/A 5. Neuropsych: This patient is capable of making decisions on his own behalf. 6. Skin/Wound Care:  Scrotal sling             -local care to bullae 7. Fluids/Electrolytes/Nutrition: Routine in and outs with follow-up chemistries 8.  End-stage renal disease.  Status post AV fistula 01/09/2019.  Hemodialysis directed.             -continue to diurese Filed Weights    02/09/19 0510  Weight: (!) 145 kg    9.  Diabetes  mellitus.  Recent hemoglobin A1c 5.0.  Blood sugar stable and discontinued. 10.  Anemia of chronic disease.  Continue Aranesp             -hgb 7.0 11.  History of left BKA 5 years ago in [facility].  Patient does have a prosthesis followed by [employer]. (see above) 12.  Hypertension.  Norvasc 10 mg daily.  Monitor with increased mobility 13.  Morbid obesity.  BMI 43.78.  Dietary follow-up 14.  Hyperlipidemia.  Lipitor 15.  History of syphilis.  Treated outpatient with penicillin to the healthcare department recently.  Outpatient follow-up      Post Admission Physician Evaluation: 1. Functional deficits secondary  to debility. 2. Patient is admitted to receive collaborative, interdisciplinary care between the physiatrist, rehab nursing staff, and therapy team. 3. Patient's level of medical complexity and substantial therapy needs in context of that medical necessity cannot be provided at a lesser intensity of care such as a SNF. 4. Patient has experienced substantial functional loss from his/her baseline which was documented above under the "Functional History" and "Functional Status" headings.  Judging by the patient's diagnosis, physical exam, and functional history, the patient has potential for functional progress which will result in measurable gains while on inpatient rehab.  These gains will be of substantial and practical use upon discharge  in facilitating mobility and self-care  at the household level. 5. Physiatrist will provide 24 hour management of medical needs as well as oversight of the therapy plan/treatment and provide guidance as appropriate regarding the interaction of the two. 6. The Preadmission Screening has been reviewed and patient status is unchanged unless otherwise stated above. 7. 24 hour rehab nursing will assist with bladder management, bowel management, safety, skin/wound care, disease management, medication administration, pain management and patient  education  and help integrate therapy concepts, techniques,education, etc. 8. PT will assess and treat for/with: Lower extremity strength, range of motion, stamina, balance, functional mobility, safety, adaptive techniques and equipment, pain mgt, wound care.   Goals are: min assist to supervision. 9. OT will assess and treat for/with: ADL's, functional mobility, safety, upper extremity strength, adaptive techniques and equipment, pain mgt, wound care.   Goals are: min assist to supervision. Therapy may proceed with showering this patient. 10. SLP will assess and treat for/with: n/a.  Goals are: n/a. 11. Case Management and Social Worker will assess and treat for psychological issues and discharge planning. 12. Team conference will be held weekly to assess progress toward goals and to determine barriers to discharge. 13. Patient will receive at least 3 hours of therapy per day at least 5 days per week. 14. ELOS: 10-14 days       15. Prognosis:  excellent   I have personally performed a face to face diagnostic evaluation of this patient and formulated the key components of the plan.  Additionally, I have personally reviewed laboratory data, imaging studies, as well as relevant notes and concur with the physician assistant's documentation above.  Meredith Staggers, MD, FAAPMR     Lavon Paganini Sherman, PA-C 02/08/2019

## 2019-02-09 NOTE — Evaluation (Signed)
Physical Therapy Assessment and Plan  Patient Details  Name: Michael Riggs MRN: [id number] Date of Birth: 1971-03-19  PT Diagnosis: Difficulty walking, Edema, Impaired sensation, Muscle weakness and Pain in abdomen/scrotum Rehab Potential: Fair ELOS: 10-14 days   Today's Date: 02/09/2019  Session #1: PT Individual Time: 845-915 (30 min); missed 30 min due to medical issues with catheterization attempt and 10/10 pain   Problem List:  Patient Active Problem List   Diagnosis Date Noted  . Debility 02/09/2019  . Fluid overload 02/04/2019  . Hyperkalemia 01/30/2019  . ARF (acute renal failure) ([place]) 01/30/2019  . Hypokalemia 01/30/2019  . Hypoglycemia 01/30/2019  . Syphilis 01/30/2019  . Macrocytic anemia 01/30/2019  . ESRD (end stage renal disease) ([place])   . PDR (proliferative diabetic retinopathy) ([place]) 12/14/2013  . Venous hypertension 09/22/2013  . Diabetes mellitus type 2 in obese ([place]) 07/04/2013  . Habitual alcohol use 07/04/2013  . Obesity, Class II, BMI 35-39.9, with comorbidity 07/04/2013    Past Medical History:  Past Medical History:  Diagnosis Date  . Anemia   . Chronic kidney disease    Stage 5  . Depression   . Diabetes mellitus without complication ([place])   . Dyspnea   . GERD (gastroesophageal reflux disease)   . Hypertension    Past Surgical History:  Past Surgical History:  Procedure Laterality Date  . AV FISTULA PLACEMENT Left 01/09/2019   Procedure: ARTERIOVENOUS (AV) FISTULA CREATION LEFT UPPER ARM;  Surgeon: [name], MD;  Location: [facility] OR;  Service: Vascular;  Laterality: Left;  . below the knee amputation Left   . EYE SURGERY    . IR FLUORO GUIDE CV LINE RIGHT  01/31/2019  . IR FLUORO GUIDE CV LINE RIGHT  02/03/2019  . IR US GUIDE VASC ACCESS RIGHT  01/31/2019  . IR US GUIDE VASC ACCESS RIGHT  02/03/2019    Assessment & Plan Clinical Impression: Patient is a 48 year old male history of syphilis, anemia of chronic disease, chronic  kidney disease with recent dialysis initiated, diabetes mellitus, morbid obesity with BMI of 43.78, hypertension and left BKA 5 years ago in [facility]. Recent admission 01/30/2019 to 02/03/2019 for uremia with acute kidney injury receiving tunneled dialysis catheter 02/02/2019 and was to begin outpatient hemodialysis at [facility] clinic Tuesday Thursday Saturday. Patient presented 02/04/2019 with increasing shortness of breath as well as scrotal edema. Chest x-ray showed mild vascular congestion. There was noted skin tears of the right lower extremity patient had noted fall since recent discharge home. Hemoglobin 7.9, creatinine 8.82, calcium 6.3, COVID negative. Hemodialysis again follow-up and did initially receive an extra treatment of hemodialysis for suspect volume overload.WOCfollow-up for wounds to right lower leg with dressing changes as directed. Subcutaneous heparin for DVT prophylaxis. Therapy evaluations completed with recommendations made for CIR. Patient is to be admitted for a comprehensive rehab program on 02/08/2019. Patient transferred to CIR on 02/08/2019 .   Patient currently requires total with mobility secondary to muscle weakness and muscle joint tightness, decreased cardiorespiratoy endurance and decreased sitting balance, decreased standing balance, decreased balance strategies and significant pain and scrotal edema.  Prior to hospitalization, patient was modified independent  with mobility and lived with Family in a [place] home.  Home access is 1Level entry.  Patient will benefit from skilled PT intervention to maximize safe functional mobility, minimize fall risk and decrease caregiver burden for planned discharge home with intermittent assist.  Anticipate patient will benefit from follow up [facility] at discharge.  PT -  End of Session Activity Tolerance: Decreased this session Endurance Deficit: Yes PT Assessment Rehab Potential (ACUTE/IP ONLY): Fair PT  Barriers to Discharge: Decreased caregiver support;Medical stability;Hemodialysis PT Barriers to Discharge Comments: mother requires care; brother intermittenlty available to assist PT Patient demonstrates impairments in the following area(s): Balance;Edema;Endurance;Motor;Pain;Sensory;Skin Integrity PT Transfers Functional Problem(s): Bed Mobility;Bed to Chair;Car;Furniture PT Locomotion Functional Problem(s): Ambulation;Wheelchair Mobility PT Plan PT Intensity: Minimum of 1-2 x/day ,45 to 90 minutes PT Frequency: 5 out of 7 days PT Duration Estimated Length of Stay: 10-14 days PT Treatment/Interventions: Ambulation/gait training;Balance/vestibular training;Community reintegration;Discharge planning;Disease management/prevention;DME/adaptive equipment instruction;Functional mobility training;Neuromuscular re-education;Pain management;Patient/family education;Psychosocial support;Skin care/wound management;Splinting/orthotics;Stair training;Therapeutic Activities;Therapeutic Exercise;UE/LE Strength taining/ROM;UE/LE Coordination activities;Wheelchair propulsion/positioning PT Transfers Anticipated Outcome(s): supervision w/c level; CGA sit <> stand and car PT Locomotion Anticipated Outcome(s): mod I w/c mobility PT Recommendation Recommendations for Other Services: Neuropsych consult Follow Up Recommendations: Home health PT Patient destination: Home Equipment Recommended: Wheelchair cushion (measurements);Wheelchair (measurements)(RW?)  Skilled Therapeutic Intervention Evaluation initiated (see details above and below)  Pt presents in bed reporting he is "ticked off" due to not getting honey with oatmeal and being in so much pain. Pt unable to even tolerate sitting with HOB elevated enough to get to breakfast tray reporting stomach pain, spasms through his anus and pressure in his scrotum. In questioning and discussion with patient about prior level of function and hospital course/admission,  asked about toileting. Pt reports not being able to urinate in 3 days and appears to be having what seems like bladder spasms. Pt reporting he is a new HD patient and had no issues with bowel/bladder function prior to admission. Pt reporting he feels the urge to urinate but cannot and is yelling out in pain. Discussed with RN and asked about scanning bladder. Pt scanned for 591 mL. RN attempting to perform catheterization with PT assisting with positioning due to difficulty and ultimately unsuccessful. PT will return to complete evaluation once urination issue resolved.  Scrotal sling present in room but not on patient (in bed) and consulted with OT who has experience with various wrapping techniques for scrotal edema management to see later today potentially to address and advise. PT obtained w/c with L amputee pad and cushion for improved positioning and OOB tolerance in preparation for OOB attempt in later session. Pt missed 30 min skilled PT due to medical issues with catheterization and 10/10 pain.  PT Evaluation Precautions/Restrictions Precautions Precautions: Fall;Other (comment) Precaution Comments: L BKA with prosthesis; significant scrotal edema Restrictions Weight Bearing Restrictions: No' \Pain'  Pain Assessment Pain Scale: 0-10 Pain Score: 10-Worst pain ever Pain Location: Bladder Pain Orientation: Lower Pain Descriptors / Indicators: Aching Pain Onset: On-going Pain Intervention(s): Repositioned Home Living/Prior Functioning Home Living Available Help at Discharge: Family;Available PRN/intermittently Type of Home: Apartment Home Access: Level entry Additional Comments: pt reports no stairs; chart states 1  Lives With: Family Prior Function Level of Independence: Independent with basic ADLs;Independent with transfers;Independent with gait;Requires assistive device for independence(uses prosthesis and w/c) Driving: Yes Comments: Pt reports he used WC for primary mobility since  swelling started, and was able to transfer independently. Prior to onset of swelling, pt able to ambulate independently with use of prosthetic.  Vision/Perception  Perception Perception: Within Functional Limits Praxis Praxis: Intact  Cognition Overall Cognitive Status: Within Functional Limits for tasks assessed Memory: Appears intact Awareness: Appears intact Problem Solving: Appears intact Safety/Judgment: Appears intact Sensation Sensation Light Touch: Impaired Detail Peripheral sensation comments: reporting tingling/numbness Light Touch Impaired Details: Impaired RLE Coordination Gross Motor Movements  are Fluid and Coordinated: No(so limited by pain) Motor  Motor Motor: Other (comment) Motor - Skilled Clinical Observations: generalized weakness and significant pain and scrotal edema limiting mobility  Mobility Bed Mobility Bed Mobility: Rolling Right;Rolling Left;Right Sidelying to Sit Rolling Right: Maximal Assistance - Patient 25-49% Rolling Left: Maximal Assistance - Patient 25-49% Right Sidelying to Sit: Maximal Assistance - Patient 25-49% Transfers Transfers: (unable due to pain and scrotal and bladder issues) Transfer (Assistive device): None  Info per OT eval. Pt unable to perform any functional mobility during PT eval to second attempt to be seen Locomotion  Stairs / Additional Locomotion Stairs: No(unable due to pain and scrotal and bladder issues)  Trunk/Postural Assessment  Cervical Assessment Cervical Assessment: Within Functional Limits Thoracic Assessment Thoracic Assessment: Exceptions to WFL(rounded shoulders) Lumbar Assessment Lumbar Assessment: Exceptions to WFL(posterior pelvic tilt) Postural Control Postural Control: Deficits on evaluation Righting Reactions: delayed Protective Responses: delayed  Balance Static Sitting Balance Static Sitting - Balance Support: Bilateral upper extremity supported Static Sitting - Level of Assistance: 2: Max  assist Static Sitting - Comment/# of Minutes: difficulty flexing hips d/t scrotal edema and pain; only able to maintain position for 30 seconds  Info per OT eval. Unable to attempt during PT eval or session. Extremity Assessment  RUE Assessment RUE Assessment: Within Functional Limits General Strength Comments: swollen LUE Assessment LUE Assessment: Within Functional Limits General Strength Comments: swollen RLE Assessment General Strength Comments: difficult to assess due to pain due to scrotal edema and bladder pain(grossly able to initiate movement in supine at hip/knee/ankl) LLE Assessment General Strength Comments: difficult to assess due to pain due to scrotal edema and bladder pain(grossly able to move extremity against gravity (h/o BKA))    Session #2: PT Individual Time: 9030-1499 PT Individual Time Calculation (min): 9 min ; missed 21 min due to medical issues and 10/10 pain Attempted to see patient again to complete evaluation but still unable to be catheterized by nursing and awaiting nephrology. Discussed general goals for PT and plan of care. Pt's prosthetic in room and asked about liner and socks. Pt reports he has not been able to try it on since being in the hospital but due to swelling, does not anticipate it to fit. Will need to assess in future session when pt able to participate. Educated on w/c level goals at this time and can adjust as pt able to tolerate more participation.    Refer to Care Plan for Long Term Goals  Recommendations for other services: Neuropsych  Discharge Criteria: Patient will be discharged from PT if patient refuses treatment 3 consecutive times without medical reason, if treatment goals not met, if there is a change in medical status, if patient makes no progress towards goals or if patient is discharged from hospital.  The above assessment, treatment plan, treatment alternatives and goals were discussed and mutually agreed upon: by  patient  Allayne Gitelman 02/09/2019, 1:02 PM

## 2019-02-09 NOTE — Plan of Care (Signed)
  Problem: Consults Goal: RH GENERAL PATIENT EDUCATION Description: See Patient Education module for education specifics. Outcome: Progressing   Problem: RH SAFETY Goal: RH STG ADHERE TO SAFETY PRECAUTIONS W/ASSISTANCE/DEVICE Description: STG Adhere to Safety Precautions With Assistance/Device. Outcome: Progressing   Problem: Consults Goal: RH GENERAL PATIENT EDUCATION Description: See Patient Education module for education specifics. Outcome: Progressing   Problem: RH SAFETY Goal: RH STG ADHERE TO SAFETY PRECAUTIONS W/ASSISTANCE/DEVICE Description: STG Adhere to Safety Precautions With Assistance/Device. Outcome: Progressing

## 2019-02-09 NOTE — Progress Notes (Signed)
Occupational Therapy Session Note  Patient Details  Name: Michael Riggs MRN: [id number] Date of Birth: [birth year]  Today's Date: 02/09/2019 OT Individual Time: 1130-1153 OT Individual Time Calculation (min): 23 min    Short Term Goals: Week 1:  OT Short Term Goal 1 (Week 1): Pt will tolerate sitting EOB wiht MIN A for sitting balance for 10 min during functional activity OT Short Term Goal 2 (Week 1): Pt will transfer to [facility] with MOD A of 1 caregiver to decrease caregiver burden OT Short Term Goal 3 (Week 1): Pt will don sock with AE PRN and min VC for technique OT Short Term Goal 4 (Week 1): Pt will bathe with MIN A  Skilled Therapeutic Interventions/Progress Updates:  Balance/vestibular training;Discharge planning;Pain management;Self Care/advanced ADL retraining;UE/LE Coordination activities;Therapeutic Activities;Functional mobility training;Disease mangement/prevention;Patient/family education;Skin care/wound managment;Therapeutic Exercise;Visual/perceptual remediation/compensation;DME/adaptive equipment instruction;Community reintegration;Neuromuscular re-education;Psychosocial support;Splinting/orthotics;UE/LE Strength taining/ROM   OT treatment session focused on scrotal edema management. Pt greeted semi-reclined in bed in 10/10 pain. Pt could hardly make out words he was in so much pain in bladder. Pt agreeable to OT making compression sock for scrotal edema management. OT utilized tubigrip to fashion sock and placed on scrotum for gentle compression. OT also elevated scrotum using towel fold with pt tolerating well. Pt left semi-reclined in bed with needs met.  Therapy Documentation Precautions:  Precautions Precautions: Fall, Other (comment) Precaution Comments: L BKA with prosthesis; significant scrotal edema Restrictions Weight Bearing Restrictions: No Pain: Pain Assessment Pain Scale: 0-10 Pain Score: 10-Worst pain ever Pain Location: Bladder Pain Orientation:  Lower Pain Descriptors / Indicators: Aching Pain Onset: On-going Pain Intervention(s): Repositioned Other Treatments:     Therapy/Group: Individual Therapy  Valma Cava 02/09/2019, 12:37 PM

## 2019-02-10 ENCOUNTER — Inpatient Hospital Stay (HOSPITAL_COMMUNITY): Payer: Medicare Other | Admitting: Occupational Therapy

## 2019-02-10 ENCOUNTER — Inpatient Hospital Stay (HOSPITAL_COMMUNITY): Payer: Medicare Other

## 2019-02-10 ENCOUNTER — Inpatient Hospital Stay (HOSPITAL_COMMUNITY): Payer: Medicare Other | Admitting: Physical Therapy

## 2019-02-10 DIAGNOSIS — Z89512 Acquired absence of left leg below knee: Secondary | ICD-10-CM

## 2019-02-10 DIAGNOSIS — Z992 Dependence on renal dialysis: Secondary | ICD-10-CM

## 2019-02-10 DIAGNOSIS — N186 End stage renal disease: Secondary | ICD-10-CM

## 2019-02-10 DIAGNOSIS — R339 Retention of urine, unspecified: Secondary | ICD-10-CM

## 2019-02-10 LAB — GLUCOSE, CAPILLARY
Glucose-Capillary: 121 mg/dL — ABNORMAL HIGH (ref 70–99)
Glucose-Capillary: 142 mg/dL — ABNORMAL HIGH (ref 70–99)

## 2019-02-10 LAB — BPAM RBC
Blood Product Expiration Date: 202008202359
Blood Product Expiration Date: 202008202359
ISSUE DATE / TIME: 202007231607
ISSUE DATE / TIME: 202007231607
Unit Type and Rh: 5100
Unit Type and Rh: 5100

## 2019-02-10 LAB — TYPE AND SCREEN
ABO/RH(D): O POS
Antibody Screen: NEGATIVE
Unit division: 0
Unit division: 0

## 2019-02-10 IMAGING — US US PELVIS COMPLETE
1 series · 8 of 8 positions shown · non-contrast
Comparison: None.

CLINICAL DATA: Urinary retention.  Assess for bladder volume.

EXAM:
ULTRASOUND OF THE MALE PELVIS

[Series 1: us pelvis complete · 8 acquisitions, 8 frames shown]
[im 1/8]
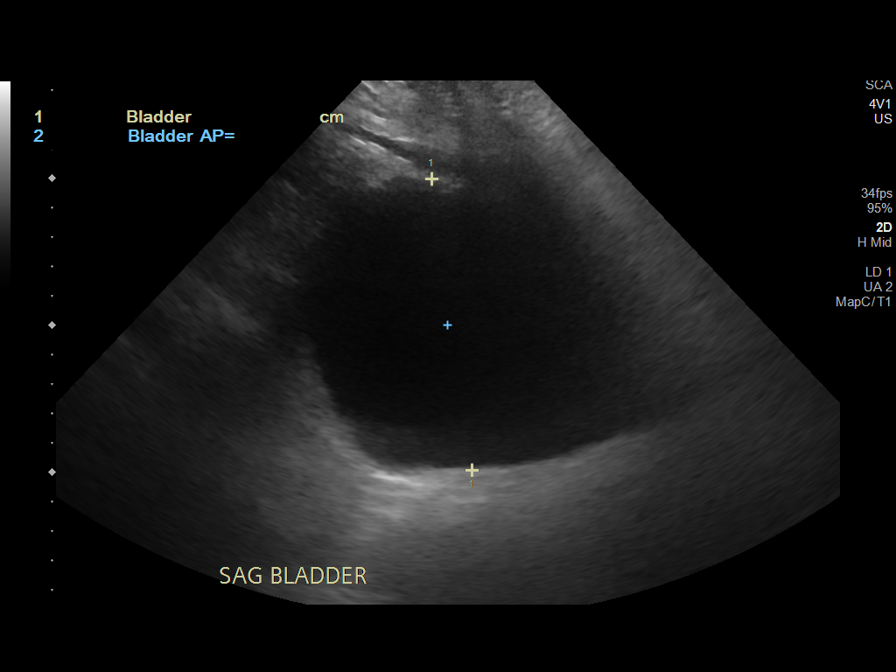
[im 2/8]
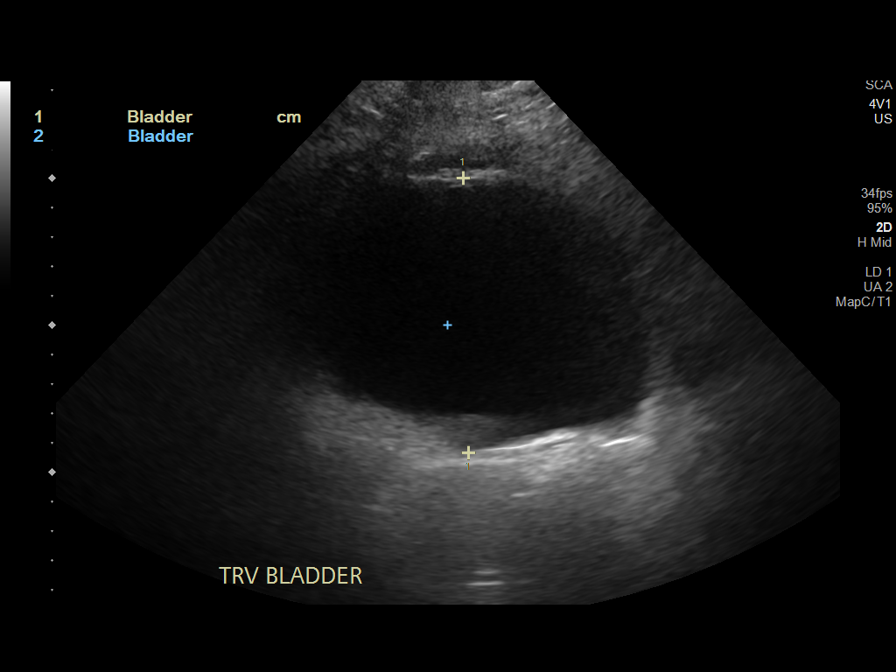
[im 3/8]
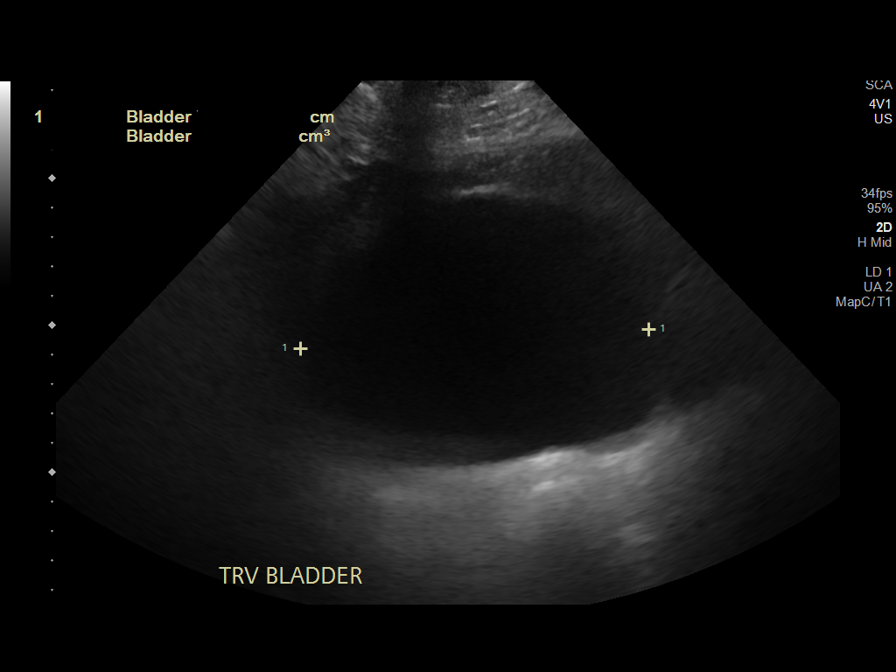
[im 4/8]
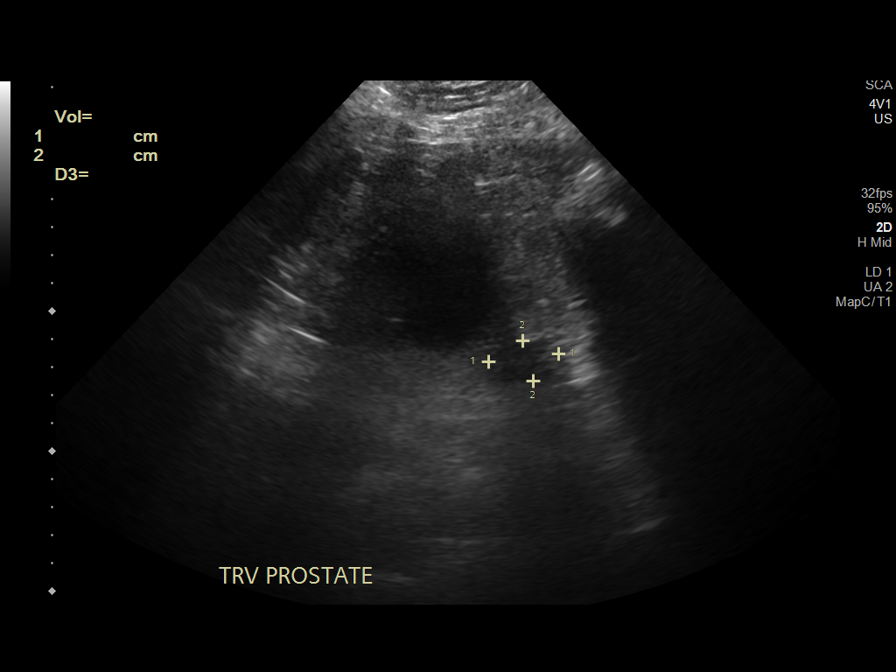
[im 5/8]
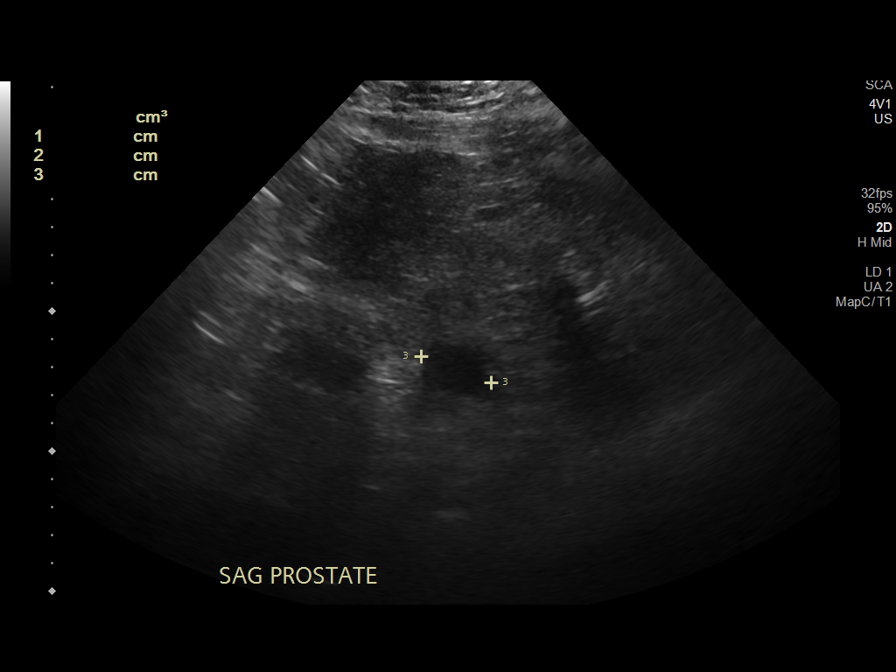
[im 6/8]
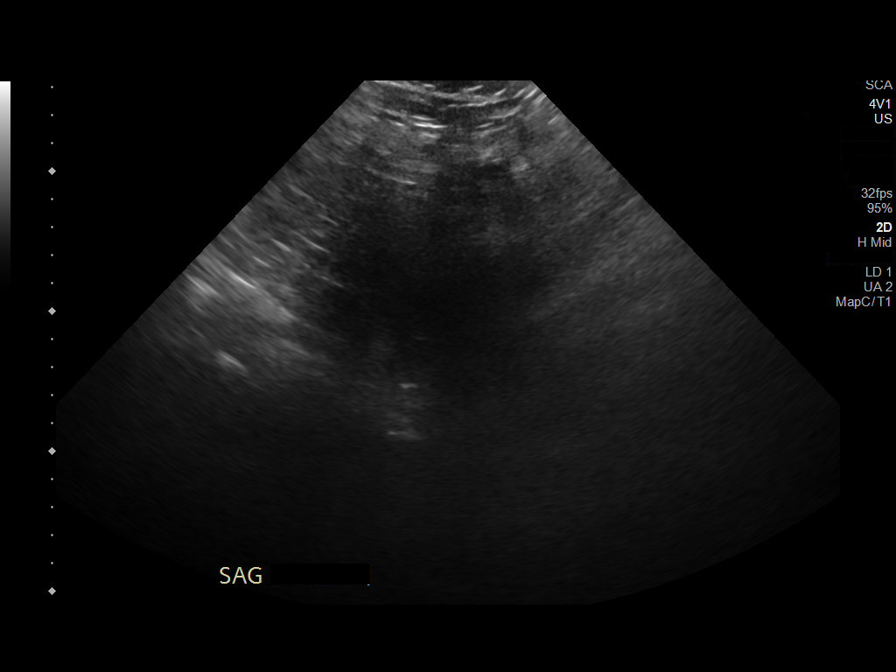
[im 7/8]
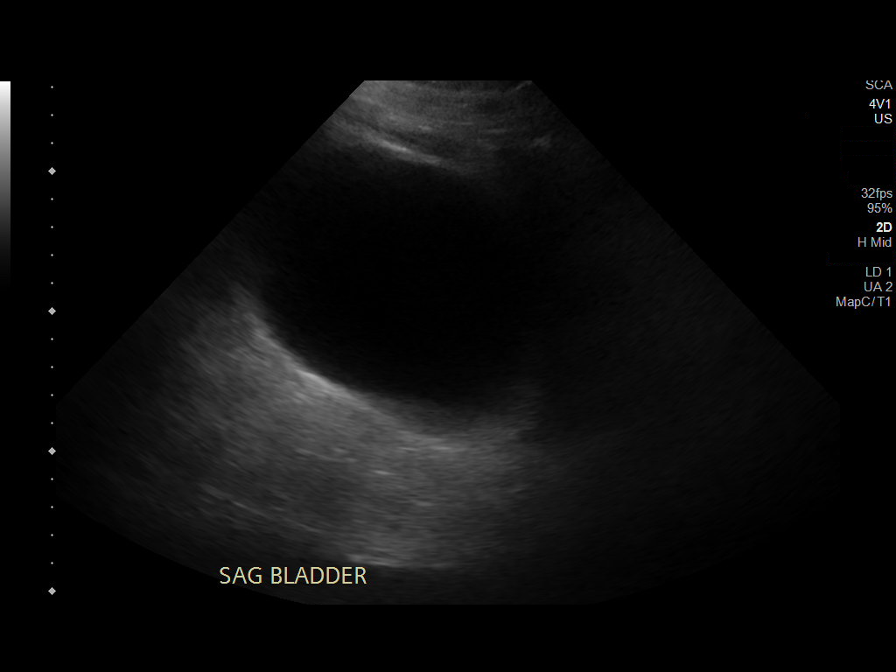
[im 8/8]
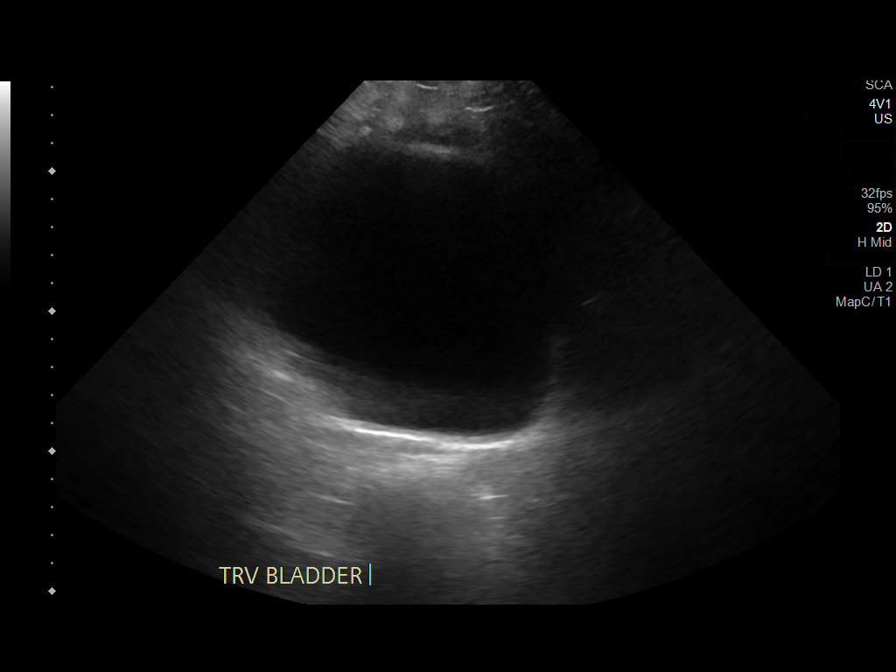

[8 of 8 positions shown; findings below may reference images not displayed]

FINDINGS: Bladder: Bladder volume 580 cc.

Prostate gland: 2.7 x 2.5 x 1.5 cm, volume 5.3 mm.

Seminal vesicles:  Not visualized.
IMPRESSION: Bladder volume of 580 cc.

## 2019-02-10 MED ORDER — POLYETHYLENE GLYCOL 3350 17 G PO PACK
17.0000 g | PACK | Freq: Every day | ORAL | Status: DC
  Administered 2019-02-11 – 2019-02-14 (×3): 17 g via ORAL
  Filled 2019-02-10 (×5): qty 1

## 2019-02-10 MED ORDER — FLEET ENEMA 7-19 GM/118ML RE ENEM: 1.0000 | ENEMA | Freq: Every day | RECTAL | Status: DC | PRN

## 2019-02-10 MED ORDER — SORBITOL 70 % SOLN
60.0000 mL | Status: AC
  Administered 2019-02-10: 60 mL via ORAL
  Filled 2019-02-10: qty 60

## 2019-02-10 MED ORDER — DEXTROSE 5 % IV SOLN
3.0000 g | Freq: Once | INTRAVENOUS | Status: AC
  Administered 2019-02-10: 3 g via INTRAVENOUS
  Filled 2019-02-10 (×2): qty 3000

## 2019-02-10 NOTE — Discharge Instructions (Signed)
Inpatient Rehab Discharge Instructions  Michael Riggs Discharge date and time: No discharge date for patient encounter.   Activities/Precautions/ Functional Status: Activity: activity as tolerated Diet: Renal diet Wound Care: keep wound clean and dry Functional status:  ___ No restrictions     ___ Walk up steps independently ___ 24/7 supervision/assistance   ___ Walk up steps with assistance ___ Intermittent supervision/assistance  ___ Bathe/dress independently ___ Walk with walker     _x__ Bathe/dress with assistance ___ Walk Independently    ___ Shower independently ___ Walk with assistance    ___ Shower with assistance ___ No alcohol     ___ Return to work/school ________  Special Instructions: No driving smoking or alcohol  Continue hemodialysis as directed  Continue Foley tube until follow-up with urology services   My questions have been answered and I understand these instructions. I will adhere to these goals and the provided educational materials after my discharge from the hospital.  Patient/Caregiver Signature _______________________________ Date __________  Clinician Signature _______________________________________ Date __________  Please bring this form and your medication list with you to all your follow-up doctor's appointments.

## 2019-02-10 NOTE — Consult Note (Addendum)
Urology Consult Note   Requesting Attending Physician:  Default, Provider, MD Service Providing Consult: Urology  Consulting Attending: Estoria Geary  Reason for Consult: Urinary retention  HPI: Michael Riggs is seen in consultation for reasons noted above at the request of Default, Provider, MD   This is a 48 y.o. male with with history of end-stage renal disease, diabetes, morbid obesity, hypertension, GERD, OSA, who is dialysis dependent.  He was recently admitted to [facility] and is now transferred to rehab.  During his hospital stay between 02/04/2019 and 02/08/2019 there was no documented urine output and patient was thought to be anuric.   Complained of bladder fullness on 02/09/19.  Urology was consulted.  Bladder scan showed 651mls but due to morbid obesity a pelvic ultrasound was obtained for more accurate measurement.  This showed 580 cc. RUS during recent admission showed no hydro.   After sterile prepping and draping I attempted to retract the foreskin and expose the glans of the penis.  Unable to do so due to phimosis.  After several attempts at blind catheterization decision was made to proceed with cystoscopy.  Again after prepping and draping a 16 French flexible cystoscope was advanced into the opening of the foreskin. Visualization was very difficult due to edematous tissues an pannus. Once inside the foreskin we visualized abnormal penile anatomy with pale glans tissue that was adherent to the foreskin at the bottom of the glans.    With much difficulty we were able to eventually visualize the meatus by pulling up upward traction on the foreskin. A sensor wire was advanced under direct vision into the meatus which caused the patient significant discomfort.  Attempted to pass a 17 [place] council over the wire but due to either meatal or urethral stricture we were unable to place this catheter.  Using a needle we poked a hole in a 12 French catheter and then were able to pass  the 12 French catheter over the wire and into the bladder with minimal resistance.  Return of light yellow urine indicated proper placement and the wire was removed.  10 cc were placed in the balloon and the catheter was attached to a drainage bag.  The patient was given Ancef for procedure prophylaxis  Past Medical History: Past Medical History:  Diagnosis Date  . Anemia   . Chronic kidney disease    Stage 5  . Depression   . Diabetes mellitus without complication ([place])   . Dyspnea   . GERD (gastroesophageal reflux disease)   . Hypertension     Past Surgical History:  Past Surgical History:  Procedure Laterality Date  . AV FISTULA PLACEMENT Left 01/09/2019   Procedure: ARTERIOVENOUS (AV) FISTULA CREATION LEFT UPPER ARM;  Surgeon: [name], MD;  Location: [facility] OR;  Service: Vascular;  Laterality: Left;  . below the knee amputation Left   . EYE SURGERY    . IR FLUORO GUIDE CV LINE RIGHT  01/31/2019  . IR FLUORO GUIDE CV LINE RIGHT  02/03/2019  . IR US GUIDE VASC ACCESS RIGHT  01/31/2019  . IR US GUIDE VASC ACCESS RIGHT  02/03/2019    Medication: Current Facility-Administered Medications  Medication Dose Route Frequency Provider Last Rate Last Dose  . 0.9 %  sodium chloride infusion (Manually program via Guardrails IV Fluids)   Intravenous Once Elmarie Shiley, MD      . acetaminophen (TYLENOL) 325 MG tablet           . acetaminophen (TYLENOL) tablet  650 mg  650 mg Oral Q6H PRN Cathlyn Parsons, PA-C   650 mg at 02/09/19 3382   Or  . acetaminophen (TYLENOL) suppository 650 mg  650 mg Rectal Q6H PRN Angiulli, Lavon Paganini, PA-C      . albuterol (PROVENTIL) (2.5 MG/3ML) 0.083% nebulizer solution 3 mL  3 mL Inhalation Q6H PRN Angiulli, Lavon Paganini, PA-C      . amLODipine (NORVASC) tablet 10 mg  10 mg Oral Daily AngiulliLavon Paganini, PA-C   10 mg at 02/09/19 0800  . atorvastatin (LIPITOR) tablet 40 mg  40 mg Oral Daily Cathlyn Parsons, PA-C   40 mg at 02/09/19 0800  . calcitRIOL (ROCALTROL)  capsule 0.25 mcg  0.25 mcg Oral Daily Cathlyn Parsons, PA-C   0.25 mcg at 02/09/19 0801  . calcium carbonate (TUMS - dosed in mg elemental calcium) chewable tablet 200 mg of elemental calcium  1 tablet Oral BID WC Angiulli, Daniel J, PA-C   200 mg of elemental calcium at 02/09/19 0800  . ceFAZolin (ANCEF) 3 g in dextrose 5 % 50 mL IVPB  3 g Intravenous Once Tharon Aquas, MD      . citalopram (CELEXA) tablet 40 mg  40 mg Oral Daily Cathlyn Parsons, PA-C   40 mg at 02/09/19 0759  . collagenase (SANTYL) ointment   Topical Daily Angiulli, Lavon Paganini, PA-C      . [START ON 02/14/2019] Darbepoetin Alfa (ARANESP) injection 100 mcg  100 mcg Intravenous Q Tue-HD Angiulli, Lavon Paganini, PA-C      . [START ON 02/11/2019] ferric gluconate (NULECIT) 62.5 mg in sodium chloride 0.9 % 100 mL IVPB  62.5 mg Intravenous Q T,Th,Sa-HD Angiulli, Lavon Paganini, PA-C      . gabapentin (NEURONTIN) capsule 300 mg  300 mg Oral QHS AngiulliLavon Paganini, PA-C   300 mg at 02/09/19 2307  . heparin 1000 UNIT/ML injection           . heparin injection 2,800 Units  20 Units/kg Dialysis PRN Rexene Agent, MD      . heparin injection 2,800 Units  20 Units/kg Dialysis PRN Rexene Agent, MD      . heparin injection 5,000 Units  5,000 Units Subcutaneous Q8H Cathlyn Parsons, PA-C   5,000 Units at 02/09/19 2307  . hydrOXYzine (ATARAX/VISTARIL) tablet 25 mg  25 mg Oral Q8H PRN Angiulli, Lavon Paganini, PA-C      . Melatonin TABS 4.5 mg  4.5 mg Oral QHS AngiulliLavon Paganini, PA-C   4.5 mg at 02/09/19 2308  . pantoprazole (PROTONIX) EC tablet 40 mg  40 mg Oral Daily Cathlyn Parsons, PA-C   40 mg at 02/09/19 0759  . sorbitol 70 % solution 30 mL  30 mL Oral Daily PRN Cathlyn Parsons, PA-C   30 mL at 02/09/19 1422  . traMADol (ULTRAM) tablet 50 mg  50 mg Oral Q6H PRN Cathlyn Parsons, PA-C   50 mg at 02/09/19 2311    Allergies: No Known Allergies  Social History: Social History   Tobacco Use  . Smoking status: Former Smoker    Types:  Cigarettes    Start date: 11/18/2018    Quit date: 01/18/2019    Years since quitting: 0.0  . Smokeless tobacco: Never Used  . Tobacco comment: pt stated got bored with it  Substance Use Topics  . Alcohol use: Not Currently    Comment: heavy drinker in the past, none since 07/19/18  . Drug  use: Not Currently    Family History History reviewed. No pertinent family history.  Review of Systems 10 systems were reviewed and are negative except as noted specifically in the HPI.  Objective   Vital signs in last 24 hours: BP 104/83 (BP Location: Right Wrist)   Pulse 88   Temp 98.5 F (36.9 C)   Resp 20   Ht 5\' 10"  (1.778 m)   Wt (!) 146.2 kg   SpO2 98%   BMI 46.25 kg/m   Physical Exam General Appearance: Morbidly obese male.  English speaking.  Alternated between sleeping and yelling in pain during the procedure which was unusual  Pulmonary: Increased respiratory effort.  Snoring. Cardiovascular: Regular rate Abdomen: Obese Musculoskeletal: Left leg amputation below the knee GU: Uncircumcised, swollen foreskin and scrotum. Neurologic:  No motor abnormalities noted.   Most Recent Labs: Lab Results  Component Value Date   WBC 15.8 (H) 02/09/2019   HGB 6.6 (LL) 02/09/2019   HCT 21.8 (L) 02/09/2019   PLT 188 02/09/2019    Lab Results  Component Value Date   NA 130 (L) 02/08/2019   K 4.0 02/08/2019   CL 97 (L) 02/08/2019   CO2 24 02/08/2019   BUN 22 (H) 02/08/2019   CREATININE 6.42 (H) 02/09/2019   CALCIUM 7.1 (L) 02/08/2019   MG 1.6 (L) 02/08/2019   PHOS 3.7 02/06/2019    Lab Results  Component Value Date   INR 1.4 (H) 01/31/2019     IMAGING: Pelvic ultrasound 7/24: 580 mL's  ------  Assessment:  48 y.o. male with dialysis dependent renal disease.  Has several urologic problems including urinary retention, phimosis, meatal stenosis/possible urethral stricture  Underwent bedside cystoscopy with difficult placement of 12 French catheter over wire on  02/10/19  Recommendations: -Foley catheter drainage, do not remove without speaking to urology  - Trend urine output over the next several days to determine how much urine the patient makes. This will help determine duration of foley  -Periprocedural Ancef given  -We will arrange follow-up for urinary retention and discussion of circumcision as an outpatient. Would be hesitent to pursue circ because of risk of buried penis when it heals  - If another catheter is needed in the future: Pull upward on foreskin (cannot expose glans but pushing back) and then use a 44fr coude catheter to find meatus.   Thank you for this consult. Please contact the urology consult pager with any further questions/concerns.  I participated in all aspects of this pt's procedure. Agree with note/assessment/plan

## 2019-02-10 NOTE — Progress Notes (Signed)
Physical Therapy Session Note  Patient Details  Name: Michael Riggs MRN: [id number] Date of Birth: 03/01/1971  Today's Date: 02/10/2019   Short Term Goals: Week 1:  PT Short Term Goal 1 (Week 1): Pt will be able to perform bed mobility with max assist PT Short Term Goal 2 (Week 1): Pt will be able to perform functional transfer bed <> w/c with max assist PT Short Term Goal 3 (Week 1): Pt will be able to perform w/c propulsion for functional mobility with supervision x 50'  Skilled Therapeutic Interventions/Progress Updates:   Pt received supine in bed, asleep. Pt aroused with significant effort from PT and then immediately fell back asleep. Pt aroused again with great effort and begins to speak unintelligibly and fall back asleep mid sentence. Pt left in bed with all needs met, as unable to stay awake at this time for PT. Re re-attempt at later time/date pending medical stability.       Therapy Documentation Precautions:  Precautions Precautions: Fall, Other (comment) Precaution Comments: L BKA with prosthesis; significant scrotal edema Restrictions Weight Bearing Restrictions: No General: PT Amount of Missed Time (min): 60 Minutes PT Missed Treatment Reason: Patient fatigue Vital Signs: Therapy Vitals Temp: 98.6 F (37 C) Pulse Rate: 84 Resp: 18 BP: (!) 150/75 Patient Position (if appropriate): Lying Oxygen Therapy SpO2: 92 % O2 Device: Room Air   Therapy/Group: Individual Therapy  Lorie Phenix 02/10/2019, 7:57 AM

## 2019-02-10 NOTE — Progress Notes (Signed)
Physical Therapy Session Note  Patient Details  Name: Yoshimi Sarr MRN: [id number] Date of Birth: 03/18/1971  Today's Date: 02/10/2019 PT Missed Time: 70 Minutes Missed Time Reason: Pain;Patient fatigue  PT asleep upon arrival. Per OT and RN, pt unable to stay awake to talk and was up all night, pain also continues to be 10/10 in scrotum. Per OT, pt requesting a day off from therapies 2/2 pain/fatigue. Missed 60 min of skilled PT.   Maelyn Berrey [name] 02/10/2019, 10:32 AM

## 2019-02-10 NOTE — IPOC Note (Signed)
Overall Plan of Care [facility]) Patient Details Name: Michael Riggs MRN: [id number] DOB: 1970-12-13  Admitting Diagnosis: <principal problem not specified>  Hospital Problems: Active Problems:   Debility     Functional Problem List: Nursing    PT Balance, Edema, Endurance, Motor, Pain, Sensory, Skin Integrity  OT Balance, Cognition, Edema, Endurance, Nutrition, Pain, Safety  SLP    TR         Basic ADL's: OT Grooming, Bathing, Dressing, Toileting     Advanced  ADL's: OT Simple Meal Preparation     Transfers: PT Bed Mobility, Bed to Chair, Car, Patent attorney, Agricultural engineer: PT Ambulation, Emergency planning/management officer     Additional Impairments: OT Fuctional Use of Upper Extremity  SLP        TR      Anticipated Outcomes Item Anticipated Outcome  Self Feeding S  Swallowing      Basic self-care  S  Toileting  S   Bathroom Transfers S  Bowel/Bladder     Transfers  supervision w/c level; CGA sit <> stand and car  Locomotion  mod I w/c mobility  Communication     Cognition     Pain     Safety/Judgment      Therapy Plan: PT Intensity: Minimum of 1-2 x/day ,45 to 90 minutes PT Frequency: 5 out of 7 days PT Duration Estimated Length of Stay: 10-14 days OT Intensity: Minimum of 1-2 x/day, 45 to 90 minutes OT Frequency: 5 out of 7 days OT Duration/Estimated Length of Stay: 10-14     Due to the current state of emergency, patients may not be receiving their 3-hours of Medicare-mandated therapy.   Team Interventions: Nursing Interventions    PT interventions Ambulation/gait training, Training and development officer, Community reintegration, Discharge planning, Disease management/prevention, DME/adaptive equipment instruction, Functional mobility training, Neuromuscular re-education, Pain management, Patient/family education, Psychosocial support, Skin care/wound management, Splinting/orthotics, Stair training, Therapeutic Activities,  Therapeutic Exercise, UE/LE Strength taining/ROM, UE/LE Coordination activities, Wheelchair propulsion/positioning  OT Interventions Balance/vestibular training, Discharge planning, Pain management, Self Care/advanced ADL retraining, UE/LE Coordination activities, Therapeutic Activities, Functional mobility training, Disease mangement/prevention, Patient/family education, Skin care/wound managment, Therapeutic Exercise, Visual/perceptual remediation/compensation, DME/adaptive equipment instruction, Community reintegration, Neuromuscular re-education, Psychosocial support, Splinting/orthotics, UE/LE Strength taining/ROM  SLP Interventions    TR Interventions    SW/CM Interventions Discharge Planning, Psychosocial Support, Patient/Family Education   Barriers to Discharge MD  Medical stability  Nursing      PT Decreased caregiver support, Medical stability, Hemodialysis mother requires care; brother intermittenlty available to assist  OT Inaccessible home environment, Decreased caregiver support, Lack of/limited family support    SLP      SW       Team Discharge Planning: Destination: PT-Home ,OT- Home , SLP-  Projected Follow-up: PT-Home health PT, OT-  Home health OT, SLP-  Projected Equipment Needs: PT-Wheelchair cushion (measurements), Wheelchair (measurements)(RW?), OT- 3 in 1 bedside comode, Tub/shower bench, To be determined, SLP-  Equipment Details: PT- , OT-  Patient/family involved in discharge planning: PT- Patient,  OT-Patient, SLP-   MD ELOS: 10-14 days Medical Rehab Prognosis:  Excellent Assessment: The patient has been admitted for CIR therapies with the diagnosis of debility. The team will be addressing functional mobility, strength, stamina, balance, safety, adaptive techniques and equipment, self-care, bowel and bladder mgt, patient and caregiver education, wound care, pain mgt, community reentry, prosthetic mgt. Goals have been set at supervision with self-care and  transfers and mod I with w/c mobility.  Prosthetist to assess for revisions to prosthesis.   Due to the current state of emergency, patients may not be receiving their 3 hours per day of Medicare-mandated therapy.    Meredith Staggers, MD, FAAPMR      See Team Conference Notes for weekly updates to the plan of care

## 2019-02-10 NOTE — Progress Notes (Signed)
[place] PHYSICAL MEDICINE & REHABILITATION PROGRESS NOTE   Subjective/Complaints: Pt had a miserable night with urine retention with inability to pass catheter. Pt really struggled d/t pain but was apologetic "for what I said"  ROS: Patient denies fever, rash, sore throat, blurred vision, nausea, vomiting, diarrhea, cough, shortness of breath or chest pain, joint or back pain, headache, or mood change.    Objective:   US Pelvis (transabdominal Only)  Result Date: 02/10/2019 CLINICAL DATA:  Urinary retention.  Assess for bladder volume. EXAM: ULTRASOUND OF THE MALE PELVIS COMPARISON:  None. FINDINGS: Bladder: Bladder volume 580 cc. Prostate gland: 2.7 x 2.5 x 1.5 cm, volume 5.3 mm. Seminal vesicles:  Not visualized. IMPRESSION: Bladder volume of 580 cc. Electronically Signed   By: Keith Rake M.D.   On: 02/10/2019 01:15   Vas US Duplex Dialysis Access (avf, Avg)  Result Date: 02/08/2019 DIALYSIS ACCESS Reason for Exam: Non-maturation of AVF. Access Site: Left Upper Extremity. Access Type: Radiobasilic AVF. Performing Technologist: June Leap RDMS, RVT  Examination Guidelines: A complete evaluation includes B-mode imaging, spectral Doppler, color Doppler, and power Doppler as needed of all accessible portions of each vessel. Unilateral testing is considered an integral part of a complete examination. Limited examinations for reoccurring indications may be performed as noted.  Findings: +--------------------+----------+-----------------+--------+ AVF                 PSV (cm/s)Flow Vol (mL/min)Comments +--------------------+----------+-----------------+--------+ Native artery inflow   120           720                +--------------------+----------+-----------------+--------+  +------------+----------+-------------+----------+--------+ OUTFLOW VEINPSV (cm/s)Diameter (cm)Depth (cm)Describe +------------+----------+-------------+----------+--------+ Shoulder       106         0.86        2.11            +------------+----------+-------------+----------+--------+ Prox UA         88        0.69        1.98            +------------+----------+-------------+----------+--------+ Mid UA         161        0.81        1.69            +------------+----------+-------------+----------+--------+ Dist UA        403        0.55        1.32            +------------+----------+-------------+----------+--------+ AC Fossa       496        0.27        1.15            +------------+----------+-------------+----------+--------+   Summary: Patent left AVF. No stenosis or obstruction noted.  *See table(s) above for measurements and observations.  Diagnosing physician: [name] MD Electronically signed by [name] MD on 02/08/2019 at 6:06:33 PM.   --------------------------------------------------------------------------------   Final    Recent Labs    02/08/19 0622 02/09/19 1500  WBC 14.0* 15.8*  HGB 7.0* 6.6*  HCT 22.8* 21.8*  PLT 138* 188   Recent Labs    02/08/19 0622 02/09/19 1500  NA 130*  --   K 4.0  --   CL 97*  --   CO2 24  --   GLUCOSE 137*  --   BUN 22*  --   CREATININE 4.48* 6.42*  CALCIUM  7.1*  --     Intake/Output Summary (Last 24 hours) at 02/10/2019 0946 Last data filed at 02/10/2019 0500 Gross per 24 hour  Intake 1043 ml  Output 5604 ml  Net -4561 ml     Physical Exam: Vital Signs Blood pressure (!) 150/75, pulse 84, temperature 98.6 F (37 C), resp. rate 18, height 5\' 10"  (1.778 m), weight (!) 139.4 kg, SpO2 92 %. Constitutional: No distress . Vital signs reviewed. Morbidly obese HEENT: EOMI, oral membranes moist Neck: supple Cardiovascular: RRR without murmur. No JVD    Respiratory: CTA Bilaterally without wheezes or rales. Normal effort    GI: BS +, non-tender, non-distended   Musculoskeletal:  Comments: 1+ RLE edema, trace edema left lower residual limb.  Scrotum softball size +, tender Neurological: He is  alertand oriented to person, place, and time. Nocranial nerve deficit.Coordinationnormal.  UE 5/5. RLE 2-3/5 prox to 3/5 distal. Decreased LT right foot.  No changes Skin: Skin iswarm. He isnot diaphoretic. Left BKA healed. Skin wound RLE with borken bullae, one remaining bulla on inner thigh. Some other scattered lesions. Callus on patella, tibia---skin stable Urology: has foley in place, rose colored urine, scrotal edema Psychiatric: pleasant    Assessment/Plan: 1. Functional deficits secondary to debility which require 3+ hours per day of interdisciplinary therapy in a comprehensive inpatient rehab setting.  Physiatrist is providing close team supervision and 24 hour management of active medical problems listed below.  Physiatrist and rehab team continue to assess barriers to discharge/monitor patient progress toward functional and medical goals  Care Tool:  Bathing    Body parts bathed by patient: Right arm, Left arm, Chest, Abdomen, Face   Body parts bathed by helper: Front perineal area, Buttocks, Right upper leg, Left upper leg, Right lower leg Body parts n/a: Left lower leg   Bathing assist Assist Level: Maximal Assistance - Patient 24 - 49%     Upper Body Dressing/Undressing Upper body dressing   What is the patient wearing?: Hospital gown only    Upper body assist Assist Level: Moderate Assistance - Patient 50 - 74%    Lower Body Dressing/Undressing Lower body dressing      What is the patient wearing?: Hospital gown only     Lower body assist       Toileting Toileting    Toileting assist       Transfers Chair/bed transfer  Transfers assist  Chair/bed transfer activity did not occur: Safety/medical concerns(unable due to pain and scrotal and bladder issues)        Locomotion Ambulation   Ambulation assist   Ambulation activity did not occur: Safety/medical concerns(unable due to pain and scrotal and bladder issues)          Walk  10 feet activity   Assist  Walk 10 feet activity did not occur: Safety/medical concerns(unable due to pain and scrotal and bladder issues)        Walk 50 feet activity   Assist Walk 50 feet with 2 turns activity did not occur: Safety/medical concerns(unable due to pain and scrotal and bladder issues)         Walk 150 feet activity   Assist Walk 150 feet activity did not occur: Safety/medical concerns(unable due to pain and scrotal and bladder issues)         Walk 10 feet on uneven surface  activity   Assist Walk 10 feet on uneven surfaces activity did not occur: Safety/medical concerns(unable due to pain and scrotal and bladder issues)  Programmer, multimedia activity did not occur: Safety/medical concerns(unable due to pain and scrotal and bladder issues)         Wheelchair 50 feet with 2 turns activity    Assist    Wheelchair 50 feet with 2 turns activity did not occur: Safety/medical concerns(unable due to pain and scrotal and bladder issues)       Wheelchair 150 feet activity     Assist Wheelchair 150 feet activity did not occur: Safety/medical concerns(unable due to pain and scrotal and bladder issues)        Medical Problem List and Plan: 1.Debilitysecondary to end-stage renal disease/multi-medical. Hemodialysis recently initiated. --Continue CIR therapies including PT, OT  -have asked bi[employer] to assess socket, needs new liner 2. Antithrombotics: -DVT/anticoagulation:Subcutaneous heparin -antiplatelet therapy: N/A 3. Pain Management:Neurontin 300 mg nightly, Ultram as needed 4. Mood:Melatonin 4.5 mg nightly, Celexa 40 mg daily -antipsychotic agents: N/A 5. Neuropsych: This patientiscapable of making decisions on hisown behalf. 6. Skin/Wound Care:Scrotal sling -local care to bullae, foam dressings 7.  Fluids/Electrolytes/Nutrition:Routine in and outs with follow-up chemistries 8. End-stage renal disease. Status post AV fistula 01/09/2019. Hemodialysis directed. -continue to diurese with HD   Filed Weights   02/09/19 1951 02/09/19 2108 02/10/19 0458  Weight: (!) 140.1 kg (!) 146.2 kg (!) 139.4 kg   9. Diabetes mellitus. Recent hemoglobin A1c 5.0. Blood sugar stable and discontinued. 10. Anemia of chronic disease. Continue Aranesp -hgb down to 6.6 yesterday  -defer potential transfusion to nephrology 11. History of left BKA 5 years ago in [place].   12. Hypertension. Norvasc 10 mg daily.    -should improve with decreased pain, foley in place 13. Morbid obesity. BMI 43.78. Dietary follow-up 14. Hyperlipidemia. Lipitor 15. History of syphilis. Treated outpatient with penicillin to the healthcare department recently. Outpatient follow-up 16. Urine retention:   -uncircumcised, difficult to pass catheter with edema  -urology finally was able to pass 12 Fr coude cath early this morning  -Foley to be left in place per urology, outpt follow up  -appreciate urology assistance  -wbc's 15k, but not too far off baseline, afebrile---observe      LOS: 2 days A FACE TO FACE EVALUATION WAS PERFORMED  Meredith Staggers 02/10/2019, 9:46 AM

## 2019-02-10 NOTE — Progress Notes (Addendum)
Pt c/o bladder pain/pressure. Bladder scan showed >600. Unable to in and out cath patient due to edema. Reesa Chew PA-C notified via telephone. Orders received to notify urology. On call Urologist Dr. Tharon Aquas, MD notified. Pelvic US ordered. Dr. Anthony Sar made aware that Pelvic US was done. Dr. Anthony Sar stated he will be in to place urinary catheter.

## 2019-02-10 NOTE — Progress Notes (Signed)
Occupational Therapy Session Note  Patient Details  Name: Michael Riggs MRN: [id number] Date of Birth: [birth year]  Today's Date: 02/10/2019  Session 1 OT Individual Time: 1517-6160 OT Individual Time Calculation (min): 39 min   Session 2 OT Individual Time: 1415-1430 OT Individual Time Calculation (min): 15 min    Short Term Goals: Week 1:  OT Short Term Goal 1 (Week 1): Pt will tolerate sitting EOB wiht MIN A for sitting balance for 10 min during functional activity OT Short Term Goal 2 (Week 1): Pt will transfer to [facility] with MOD A of 1 caregiver to decrease caregiver burden OT Short Term Goal 3 (Week 1): Pt will don sock with AE PRN and min VC for technique OT Short Term Goal 4 (Week 1): Pt will bathe with MIN A  Skilled Therapeutic Interventions/Progress Updates:  Session 1   Pt greeted semi-reclined in bed asleep. OT able to wake pt, but he constantly falls back asleep. OT changed environment with turning on lights and raised HOB with pt then able to maintain alertness. Pt reported 10/10 pain in his bottom 2/2 not being able to have a BM, although he feels like he has to. Encouraged pt to try to sit on [facility] with OT, but he refused. OT educated pt on importance of mobility to help with bowel issues as well as scrotal edema. With encouragement, pt agreeable to try to sit EOB. On first attempt, pt only able to get his R LE off the bed, and body 1/2 way towards EOB before refusing to go further 2/2 scrotal pain. Pt agreeable to OT placing scrotal sling prior to bed mobility to decrease discomfort. Scrotal sling placed on scrotum and around neck. Pt able to get a little further toward EOB, but again stopped when he was almost there and said he couldn't do it 2/2 pain. Bed placed in trendelenburg and pt able to pull himself up in bed. Pt requests to eat breakfast, so breakfast tray set-up in bed and pt able to self-feed. OT left scrotal sling placed around scrotum but not tied up in prep  for next therapy session. Pt left with bed alarm on and needs met.   Session 2 Pt greeted semi-reclined in bed asleep. OT able to wake patient with him stating "not you again, I told you I wouldn't do anything today." Pt upset that he hasn't pooped and his scrotum hurts. For the second time today, OT explained the importance of mobility and encouraged him to try to get to [facility] to try to go to the bathroom. Pt continued to refuse and became agitated with OT. Pt agreeable to OT elevating scrotum, however, he was not able to tolerate elevation 2/2 pain. OT set pt up for lunch and he was left semi-reclined in bed with bed alarm on eating lunch.   Therapy Documentation Precautions:  Precautions Precautions: Fall, Other (comment) Precaution Comments: L BKA with prosthesis; significant scrotal edema Restrictions Weight Bearing Restrictions: No General: General OT Amount of Missed Time: 60 Minutes PT Missed Treatment Reason: Patient fatigue, Pain Pain: Pain Assessment Pain Scale: 0-10 Pain Score: 10-Worst pain ever Pain Type: Acute pain Pain Location: Scrotum Pain Orientation: Left Pain Descriptors / Indicators: Aching Pain Onset: On-going Pain Intervention(s): Repositioned   Therapy/Group: Individual Therapy  Valma Cava 02/10/2019, 2:44 PM

## 2019-02-10 NOTE — Progress Notes (Signed)
Came back at 10 pm as patient had requested earlier, but patient did not want to go on CPAP at that time.  Patient asked if he could wait, I informed patient that I had to do rounds on another floor at 11p and that it may take till midnight before I could come back.  Patient stated that would be fine.  Will try again at midnight to get patient to wear CPAP.

## 2019-02-10 NOTE — Progress Notes (Signed)
Admit: 02/08/2019 LOS: 2  16M recent start ESRD (at [facility]) admitted with hypervolemia, poor physical stamina  Subjective:   Difficult Foley placement yesterday  HD yesterday, 5 L UF; post weight 140.1 kg  07/23 0701 - 07/24 0700 In: 1523 [P.O.:1200; Blood:323] Out: 5604 [Urine:600]  Filed Weights   02/09/19 1951 02/09/19 2108 02/10/19 0458  Weight: (!) 140.1 kg (!) 146.2 kg (!) 139.4 kg    Scheduled Meds: . sodium chloride   Intravenous Once  . amLODipine  10 mg Oral Daily  . atorvastatin  40 mg Oral Daily  . calcitRIOL  0.25 mcg Oral Daily  . calcium carbonate  1 tablet Oral BID WC  . citalopram  40 mg Oral Daily  . collagenase   Topical Daily  . [START ON 02/14/2019] darbepoetin (ARANESP) injection - DIALYSIS  100 mcg Intravenous Q Tue-HD  . gabapentin  300 mg Oral QHS  . heparin  5,000 Units Subcutaneous Q8H  . Melatonin  4.5 mg Oral QHS  . pantoprazole  40 mg Oral Daily   Continuous Infusions: . [START ON 02/11/2019] ferric gluconate (FERRLECIT/NULECIT) IV     PRN Meds:.acetaminophen **OR** acetaminophen, albuterol, heparin, heparin, hydrOXYzine, sorbitol, traMADol  Current Labs: reviewed   Physical Exam:  Blood pressure (!) 150/75, pulse 84, temperature 98.6 F (37 C), resp. rate 18, height 5\' 10"  (1.778 m), weight (!) 139.4 kg, SpO2 92 %. Pleasant somnolent, RRR nl s1s2 L 1st stage BC AVF +B/T 3+ LEE, pitting in RLE, stump swollen CTAB Nonfocal, AAO x3 EOMI  A 1. ESRD, new, for [facility] THS upon DC 2. HTN / Vol: sig edema, cont to push down post HD weights 3. S/p L BC AVF, will need 2nd stag,e using TDC 4. Ambulatory dysfunction, PT/OT, in CIR 5. CKD BMD: cont C3, PTH ok 6. Anemia: On Aranesp  P . HD tomorrow on THS schedule: 5-6L UF, TDC, 4.5h, 2K, Tight heparin . Increase Aranesp to 200 mcg a week . Transfuse during dialysis if hemoglobin less than 7 . Medication Issues; o Preferred narcotic agents for pain control are hydromorphone, fentanyl, and  methadone. Morphine should not be used.  o Baclofen should be avoided o Avoid oral sodium phosphate and magnesium citrate based laxatives / bowel preps    Pearson Grippe MD 02/10/2019, 12:38 PM  Recent Labs  Lab 02/06/19 0742 02/07/19 0034 02/08/19 0622 02/09/19 1500  NA 131* 129* 130*  --   K 3.9 3.9 4.0  --   CL 96* 96* 97*  --   CO2 25 24 24   --   GLUCOSE 106* 168* 137*  --   BUN 40* 27* 22*  --   CREATININE 7.31* 5.20* 4.48* 6.42*  CALCIUM 6.5* 6.8* 7.1*  --   PHOS 3.7  --   --   --    Recent Labs  Lab 02/07/19 0034 02/08/19 0622 02/09/19 1500  WBC 14.8* 14.0* 15.8*  NEUTROABS 11.3* 9.7* 11.7*  HGB 7.0* 7.0* 6.6*  HCT 22.9* 22.8* 21.8*  MCV 96.6 98.7 100.0  PLT 120* 138* 188

## 2019-02-11 ENCOUNTER — Inpatient Hospital Stay (HOSPITAL_COMMUNITY): Payer: Medicare Other

## 2019-02-11 ENCOUNTER — Inpatient Hospital Stay (HOSPITAL_COMMUNITY): Payer: Medicare Other | Admitting: Occupational Therapy

## 2019-02-11 ENCOUNTER — Inpatient Hospital Stay (HOSPITAL_COMMUNITY): Payer: Medicare Other | Admitting: Rehabilitation

## 2019-02-11 DIAGNOSIS — E1129 Type 2 diabetes mellitus with other diabetic kidney complication: Secondary | ICD-10-CM

## 2019-02-11 DIAGNOSIS — R34 Anuria and oliguria: Secondary | ICD-10-CM

## 2019-02-11 LAB — CBC
HCT: 25.4 % — ABNORMAL LOW (ref 39.0–52.0)
Hemoglobin: 7.7 g/dL — ABNORMAL LOW (ref 13.0–17.0)
MCH: 29.1 pg (ref 26.0–34.0)
MCHC: 30.3 g/dL (ref 30.0–36.0)
MCV: 95.8 fL (ref 80.0–100.0)
Platelets: 220 10*3/uL (ref 150–400)
RBC: 2.65 MIL/uL — ABNORMAL LOW (ref 4.22–5.81)
RDW: 13.8 % (ref 11.5–15.5)
WBC: 14 10*3/uL — ABNORMAL HIGH (ref 4.0–10.5)
nRBC: 0 % (ref 0.0–0.2)

## 2019-02-11 LAB — RENAL FUNCTION PANEL
Albumin: 1.5 g/dL — ABNORMAL LOW (ref 3.5–5.0)
Anion gap: 10 (ref 5–15)
BUN: 32 mg/dL — ABNORMAL HIGH (ref 6–20)
CO2: 25 mmol/L (ref 22–32)
Calcium: 7.6 mg/dL — ABNORMAL LOW (ref 8.9–10.3)
Chloride: 94 mmol/L — ABNORMAL LOW (ref 98–111)
Creatinine, Ser: 5.88 mg/dL — ABNORMAL HIGH (ref 0.61–1.24)
GFR calc Af Amer: 12 mL/min — ABNORMAL LOW (ref >60)
GFR calc non Af Amer: 11 mL/min — ABNORMAL LOW (ref >60)
Glucose, Bld: 168 mg/dL — ABNORMAL HIGH (ref 70–99)
Phosphorus: 3.6 mg/dL (ref 2.5–4.6)
Potassium: 3.9 mmol/L (ref 3.5–5.1)
Sodium: 129 mmol/L — ABNORMAL LOW (ref 135–145)

## 2019-02-11 IMAGING — CT CT ABDOMEN AND PELVIS WITHOUT CONTRAST
2 of 4 series · 16 of 46 positions shown, 18 images · non-contrast
Comparison: CT the abdomen and pelvis [DATE].

CLINICAL DATA: 47-year-old male with history of diffuse abdominal
pain.

EXAM:
CT ABDOMEN AND PELVIS WITHOUT CONTRAST
TECHNIQUE: Multidetector CT imaging of the abdomen and pelvis was performed
following the standard protocol without IV contrast.

[Series 3: ap without · axial · non-contrast · 0.98mm/px · z∈[+757,+1287]mm · 13 of 120 slices shown, 15 images]
[im 7/120  soft-tissue]
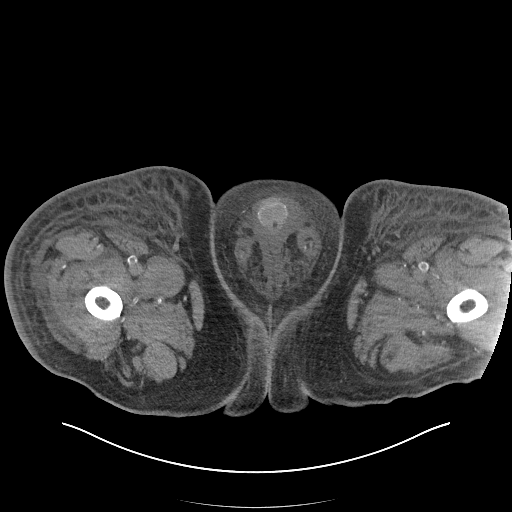
[im 7/120  bone]
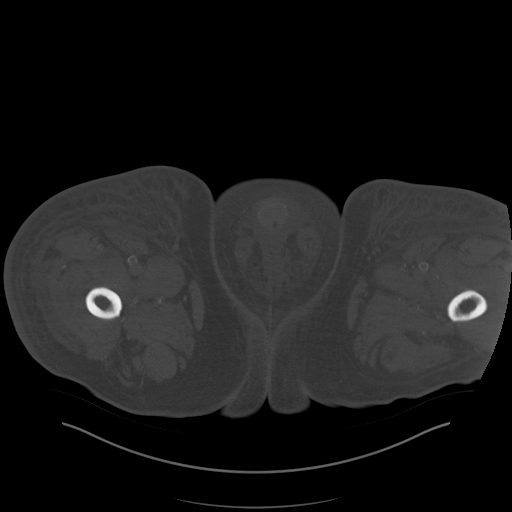
[im 14/120  soft-tissue]
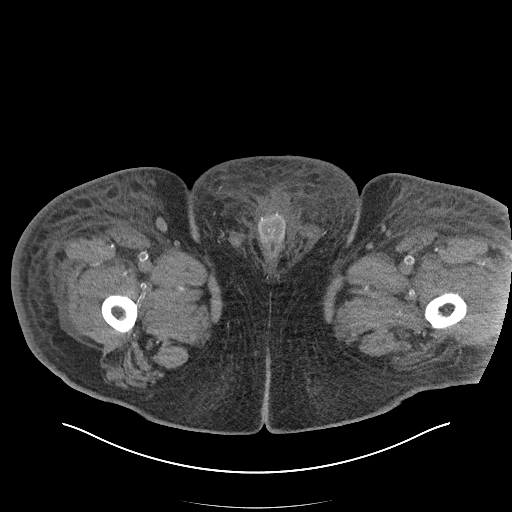
[im 27/120  soft-tissue]
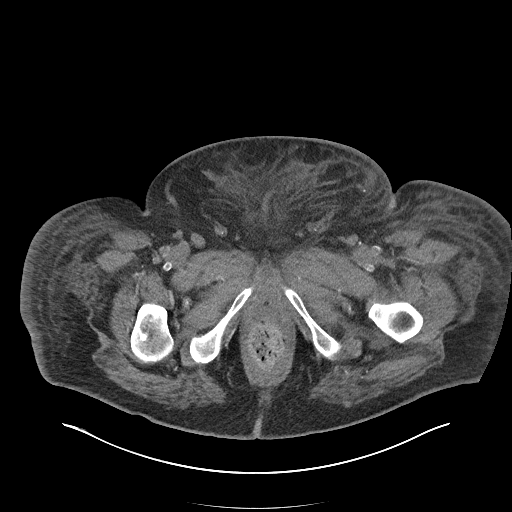
[im 34/120  soft-tissue]
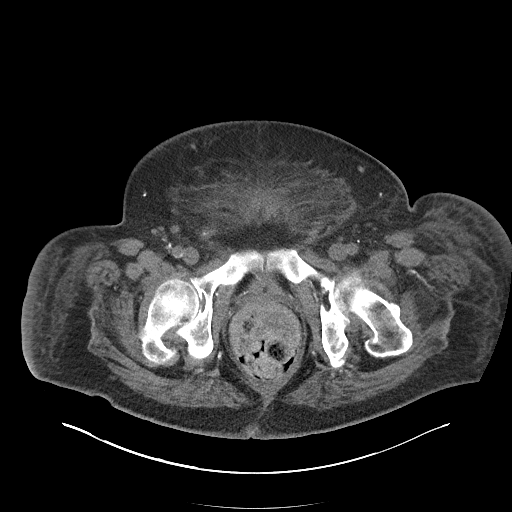
[im 40/120  soft-tissue]
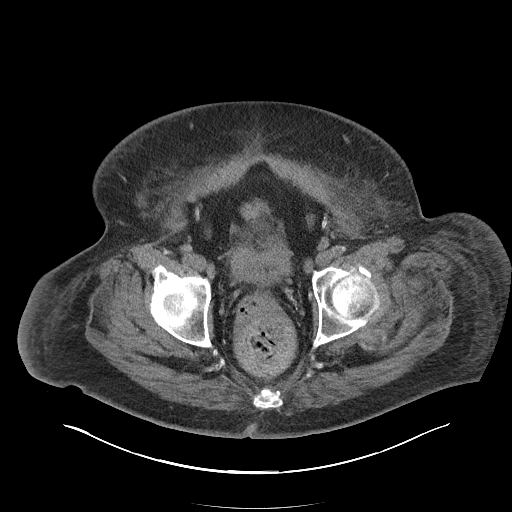
[im 53/120  soft-tissue]
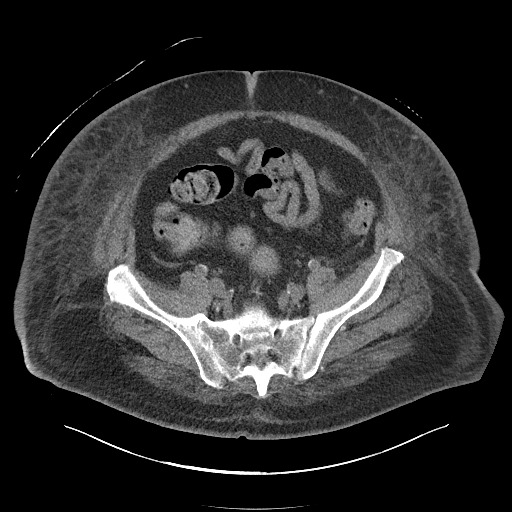
[im 60/120  soft-tissue]
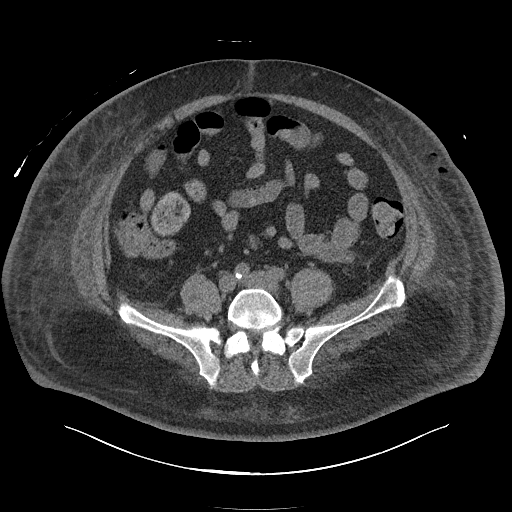
[im 67/120  soft-tissue]
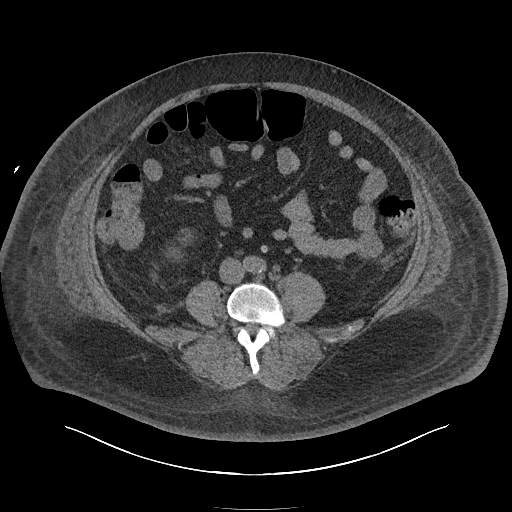
[im 80/120  soft-tissue]
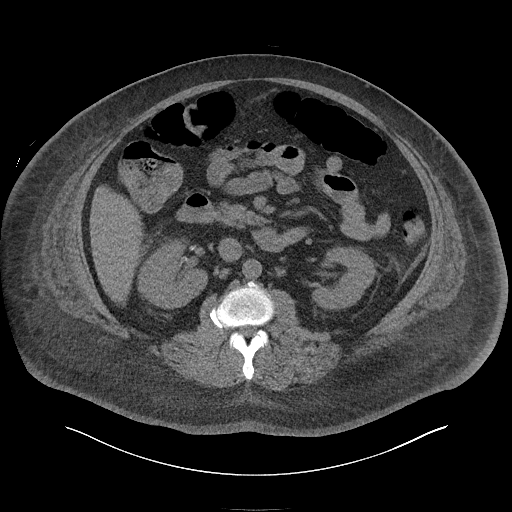
[im 80/120  bone]
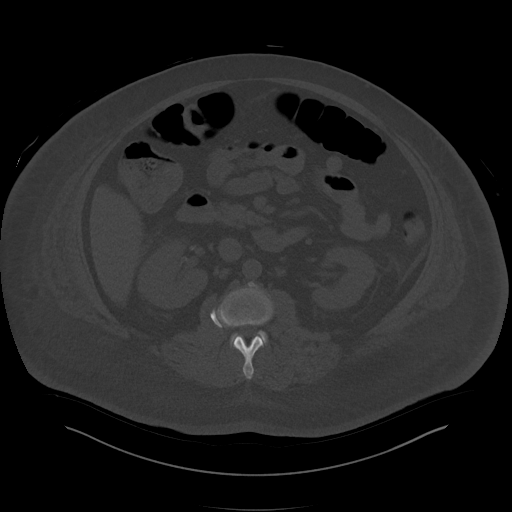
[im 86/120  soft-tissue]
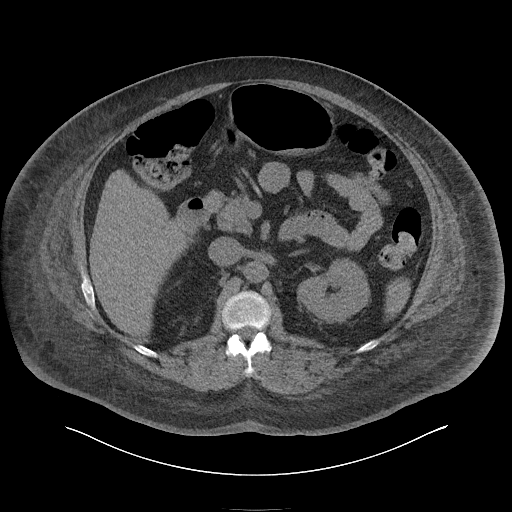
[im 93/120  soft-tissue]
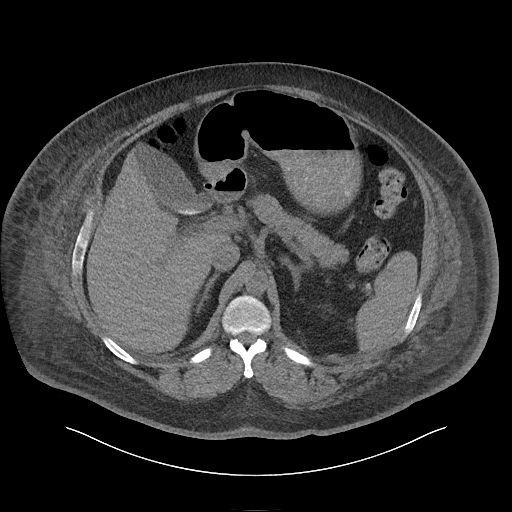
[im 106/120  soft-tissue]
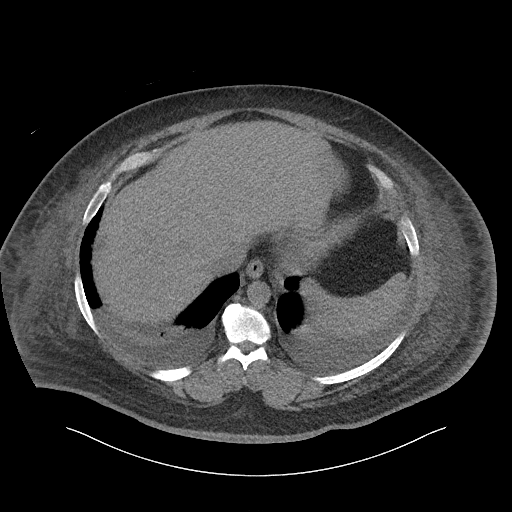
[im 113/120  soft-tissue]
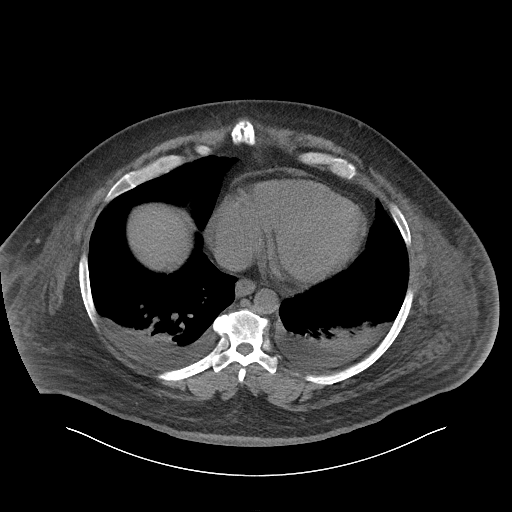

[Series 6: cor · coronal · 1.09mm/px · 3 of 110 slices shown]
[im 37/110  soft-tissue]
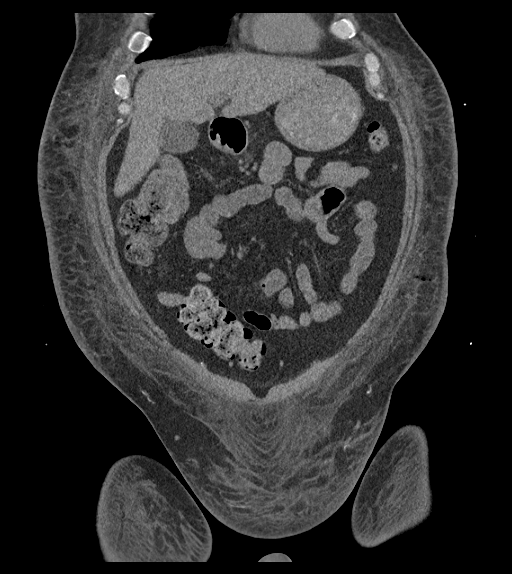
[im 49/110  soft-tissue]
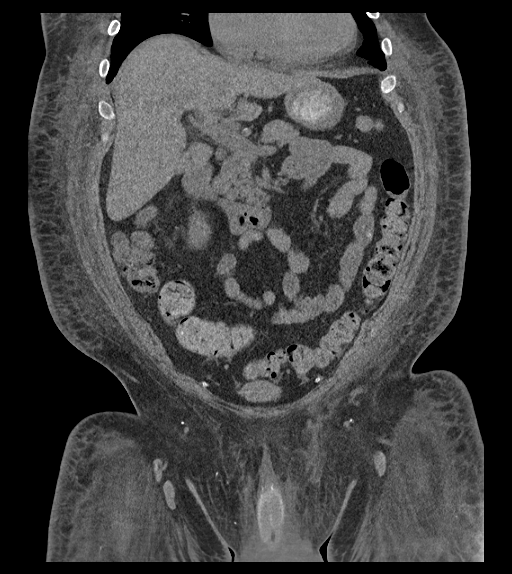
[im 61/110  soft-tissue]
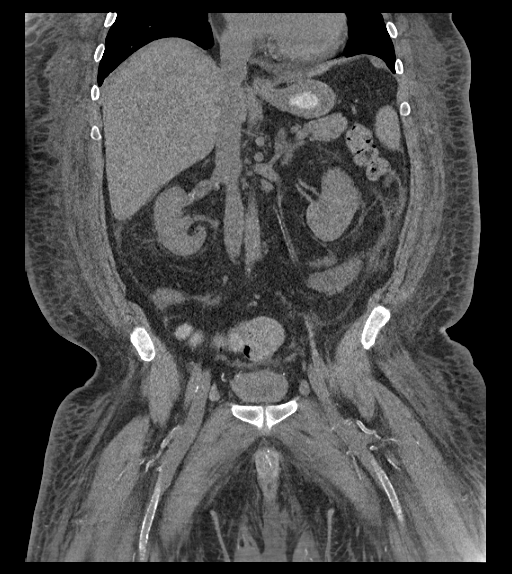

[16 of 46 positions shown; findings below may reference images not displayed]

FINDINGS: Lower chest: Small bilateral pleural effusions lying dependently
with associated passive subsegmental atelectasis in the lower lobes
of the lungs bilaterally.

Hepatobiliary: No definite suspicious cystic or solid hepatic
lesions are confidently identified on today's noncontrast CT
examination. Amorphous dependent high attenuation material in the
gallbladder, compatible with tiny calcified gallstones. Gallbladder
is not distended. No pericholecystic fluid or inflammatory changes.

Pancreas: No pancreatic mass. No pancreatic ductal dilatation. No
pancreatic or peripancreatic fluid collections or inflammatory
changes.

Spleen: Unremarkable.

Adrenals/Urinary Tract: No calcifications are identified within the
collecting system of either kidney, along the course of either
ureter, or within the lumen of the urinary bladder. Unenhanced
appearance of the kidneys and bilateral adrenal glands is normal.
Foley balloon catheter within the urinary bladder which appears
nearly completely decompressed. Small amount of gas in the urinary
bladder is iatrogenic.

Stomach/Bowel: Normal appearance of the stomach. No pathologic
dilatation of small bowel or colon. Normal appendix.

Vascular/Lymphatic: Aortic atherosclerosis. No lymphadenopathy noted
in the abdomen or pelvis.

Reproductive: Prostate gland and seminal vesicles are unremarkable
in appearance.

Other: Trace volume of ascites. Mild diffuse mesenteric edema. No
pneumoperitoneum.

Musculoskeletal: Severe diffuse body wall edema. Severe scrotal
edema (incompletely imaged). There are no aggressive appearing lytic
or blastic lesions noted in the visualized portions of the skeleton.
IMPRESSION: 1. There is a spectrum of findings, compatible with a state of
anasarca, including bilateral pleural effusions, diffuse body wall
edema, scrotal edema and trace volume of ascites.
2. No acute findings are noted in the abdomen or pelvis. Small
amount of tiny calcified gallstones lying dependently in the
gallbladder. No findings to suggest an acute cholecystitis at this
time.
3. Normal appendix.
4. Aortic atherosclerosis.
5. Additional incidental findings, as above.

## 2019-02-11 MED ORDER — HEPARIN SODIUM (PORCINE) 1000 UNIT/ML DIALYSIS
20.0000 [IU]/kg | INTRAMUSCULAR | Status: DC | PRN
  Administered 2019-02-11: 2800 [IU] via INTRAVENOUS_CENTRAL
  Filled 2019-02-11: qty 2.8

## 2019-02-11 MED ORDER — HEPARIN SODIUM (PORCINE) 1000 UNIT/ML IJ SOLN
INTRAMUSCULAR | Status: AC
  Administered 2019-02-11: 2800 [IU] via INTRAVENOUS_CENTRAL
  Filled 2019-02-11: qty 3

## 2019-02-11 MED ORDER — TRAMADOL HCL 50 MG PO TABS
ORAL_TABLET | ORAL | Status: AC
  Filled 2019-02-11: qty 1

## 2019-02-11 MED ORDER — HEPARIN SODIUM (PORCINE) 1000 UNIT/ML IJ SOLN
INTRAMUSCULAR | Status: AC
  Administered 2019-02-11: 3800 [IU] via ARTERIOVENOUS_FISTULA
  Filled 2019-02-11: qty 4

## 2019-02-11 NOTE — Progress Notes (Signed)
Patient had c/o stomach pain, full bladder, and inability to pass gas. Last void was 02/10/2019 @ 1930 for 150 mL, tea colored. No further output.   (0449 vitals: Temp 99.2, BP 125/88, Pulse 79, SpO2 88)  Called doctor on call (Dr. Inda Merlin) @ [phone]. Doctor ordered irrigation of foley catheter and sorbitol 54mL.   Irrigated foley catheter, saline solution returned clear with no resistance. Sorbitol given. Patient in bed in lowest position, call bell within reach.  Follow up call to Dr. Inda Merlin. Instructed to call urologist on call for further assistance.

## 2019-02-11 NOTE — Progress Notes (Signed)
Occupational Therapy Session Note  Patient Details  Name: Michael Riggs MRN: [id number] Date of Birth: 1971-04-22  Today's Date: 02/11/2019 OT Individual Time: 3729-0211 OT Individual Time Calculation (min): 75 min   Short Term Goals: Week 1:  OT Short Term Goal 1 (Week 1): Pt will tolerate sitting EOB wiht MIN A for sitting balance for 10 min during functional activity OT Short Term Goal 2 (Week 1): Pt will transfer to [facility] with MOD A of 1 caregiver to decrease caregiver burden OT Short Term Goal 3 (Week 1): Pt will don sock with AE PRN and min VC for technique OT Short Term Goal 4 (Week 1): Pt will bathe with MIN A  Skilled Therapeutic Interventions/Progress Updates:  Session 1   Pt greeted semi-reclined in bed, again, refusing therapy 2/2 abdominal/scrotal pain and bladder fullness. Pt stated the nursing staff got him on the [facility] using the lift last night and was able to have a BM. OT encouraged pt to try to get to EOB and transfer to [facility] with OT assist and to try without lift today. Pt became agitated stating "How can you expect me to do that right now when I am hurting this bad?" Pt agreeable to get to [facility] using maxisky lift. Pt able to roll L and R with min A to place sling. Dependent lift to bariatric BSC. Pt encouraged not to strain when trying to have a BM, he was eventually successful. OT lifted him off BSC for dependent peri-care. Pt initially agreeable to then get in the wc, but stated he was in too much pain and requested to return to bed. Dependent lift back to bed. Pt left semi-reclined in bed with breakfast tray set-up and needs met.   Therapy Documentation Precautions:  Precautions Precautions: Fall, Other (comment) Precaution Comments: L BKA with prosthesis; significant scrotal edema Restrictions Weight Bearing Restrictions: No Pain:  10/10 pain in scrotum- OT repositioned pt for comfort.      Therapy/Group: Individual Therapy  Valma Cava 02/11/2019, 8:33  AM

## 2019-02-11 NOTE — Progress Notes (Signed)
Physical Therapy Session Note  Patient Details  Name: Michael Riggs MRN: [id number] Date of Birth: May 14, 1971  Today's Date: 02/11/2019 PT Individual Time: 1000-1030 PT Individual Time Calculation (min): 30 min   Short Term Goals: Week 1:  PT Short Term Goal 1 (Week 1): Pt will be able to perform bed mobility with max assist PT Short Term Goal 2 (Week 1): Pt will be able to perform functional transfer bed <> w/c with max assist PT Short Term Goal 3 (Week 1): Pt will be able to perform w/c propulsion for functional mobility with supervision x 50'  Skilled Therapeutic Interventions/Progress Updates:   Pt received lying in bed, breakfast tray in front of him.  Pt reports he wants to eat and needs to rest as he just got done with OT and is in pain.  Gave pt 30 mins per request and returned to room.  Pt still had not eaten reporting he will wait until we are done.  Pt attempted to get EOB at max A, however had to return to supine due to pain.  He requested to don prosthesis (PT was going to wait until at [facility]), therefore PT assisted with donning prosthesis.  Again, max A with assist to scoot bed pad to get hips to EOB.  Pt performed lateral scooting at mod A towards w/c.  Transferred to w/c via squat pivot at mod A level.  Needed cues and assist for safety with assist for placement of prosthesis during transfer.  Pt left in w/c with all needs in reach.  RN informed to maybe try and get bari recliner so that he may be safer/more comfortable.    Therapy Documentation Precautions:  Precautions Precautions: Fall, Other (comment) Precaution Comments: L BKA with prosthesis; significant scrotal edema Restrictions Weight Bearing Restrictions: No    Pain: 10/10 when moving legs closer together.     Therapy/Group: Individual Therapy  Denice Bors 02/11/2019, 9:50 AM

## 2019-02-11 NOTE — Progress Notes (Signed)
Admit: 02/08/2019 LOS: 3  77M recent start ESRD (at [facility]) admitted with hypervolemia, poor physical stamina  Subjective:   Had loss of UOP, imaging showing good function  Finished with therapy for today  07/24 0701 - 07/25 0700 In: 438 [P.O.:438] Out: 150 [Urine:150]  Filed Weights   02/09/19 1951 02/09/19 2108 02/10/19 0458  Weight: (!) 140.1 kg (!) 146.2 kg (!) 139.4 kg    Scheduled Meds: . sodium chloride   Intravenous Once  . amLODipine  10 mg Oral Daily  . atorvastatin  40 mg Oral Daily  . calcitRIOL  0.25 mcg Oral Daily  . calcium carbonate  1 tablet Oral BID WC  . citalopram  40 mg Oral Daily  . collagenase   Topical Daily  . [START ON 02/14/2019] darbepoetin (ARANESP) injection - DIALYSIS  100 mcg Intravenous Q Tue-HD  . gabapentin  300 mg Oral QHS  . heparin  5,000 Units Subcutaneous Q8H  . Melatonin  4.5 mg Oral QHS  . pantoprazole  40 mg Oral Daily  . polyethylene glycol  17 g Oral Daily   Continuous Infusions: . ferric gluconate (FERRLECIT/NULECIT) IV     PRN Meds:.acetaminophen **OR** acetaminophen, albuterol, heparin, heparin, hydrOXYzine, sodium phosphate, sorbitol, traMADol  Current Labs: reviewed   Physical Exam:  Blood pressure 122/82, pulse 79, temperature 99.2 F (37.3 C), temperature source Oral, resp. rate 18, height 5\' 10"  (1.778 m), weight (!) 139.4 kg, SpO2 (!) 88 %. Pleasant somnolent, RRR nl s1s2 L 1st stage BC AVF +B/T 3+ LEE, pitting in RLE, stump swollen CTAB Nonfocal, AAO x3 EOMI  A 1. ESRD, new, for [facility] THS upon DC 2. HTN / Vol: sig edema, cont to push down post HD weights 3. S/p L BC AVF, will need 2nd stage using TDC 4. Ambulatory dysfunction, PT/OT, in CIR 5. CKD BMD: cont C3, PTH ok 6. Anemia: On Aranesp  P . HD today on THS schedule: 5-6L UF, TDC, 4.5h, 2K, Tight heparin . Increase Aranesp to 200 mcg a week . Transfuse during dialysis if hemoglobin less than 7 . Medication Issues; o Preferred narcotic agents for pain  control are hydromorphone, fentanyl, and methadone. Morphine should not be used.  o Baclofen should be avoided o Avoid oral sodium phosphate and magnesium citrate based laxatives / bowel preps    Pearson Grippe MD 02/11/2019, 11:39 AM  Recent Labs  Lab 02/06/19 0742 02/07/19 0034 02/08/19 0622 02/09/19 1500  NA 131* 129* 130*  --   K 3.9 3.9 4.0  --   CL 96* 96* 97*  --   CO2 25 24 24   --   GLUCOSE 106* 168* 137*  --   BUN 40* 27* 22*  --   CREATININE 7.31* 5.20* 4.48* 6.42*  CALCIUM 6.5* 6.8* 7.1*  --   PHOS 3.7  --   --   --    Recent Labs  Lab 02/07/19 0034 02/08/19 0622 02/09/19 1500  WBC 14.8* 14.0* 15.8*  NEUTROABS 11.3* 9.7* 11.7*  HGB 7.0* 7.0* 6.6*  HCT 22.9* 22.8* 21.8*  MCV 96.6 98.7 100.0  PLT 120* 138* 188

## 2019-02-11 NOTE — Progress Notes (Signed)
Subjective: I was called this morning regarding a lack of urine output and an [place] that showed an elevated PVR.  The catheter irrigated quantitatively.  I suggested a CT pelvis to insure proper foley placement.  The foley is properly positioned and the bladder is decompressed.  Obstruction is not the cause of the oliguria.  ROS:  ROS  Anti-infectives: Anti-infectives (From admission, onward)   Start     Dose/Rate Route Frequency Ordered Stop   02/10/19 0330  ceFAZolin (ANCEF) 3 g in dextrose 5 % 50 mL IVPB     3 g 100 mL/hr over 30 Minutes Intravenous  Once 02/10/19 0324 02/10/19 1254      Current Facility-Administered Medications  Medication Dose Route Frequency Provider Last Rate Last Dose  . 0.9 %  sodium chloride infusion (Manually program via Guardrails IV Fluids)   Intravenous Once Elmarie Shiley, MD      . acetaminophen (TYLENOL) tablet 650 mg  650 mg Oral Q6H PRN Cathlyn Parsons, PA-C   650 mg at 02/09/19 9390   Or  . acetaminophen (TYLENOL) suppository 650 mg  650 mg Rectal Q6H PRN Angiulli, Lavon Paganini, PA-C      . albuterol (PROVENTIL) (2.5 MG/3ML) 0.083% nebulizer solution 3 mL  3 mL Inhalation Q6H PRN Angiulli, Lavon Paganini, PA-C      . amLODipine (NORVASC) tablet 10 mg  10 mg Oral Daily Cathlyn Parsons, PA-C   10 mg at 02/11/19 0805  . atorvastatin (LIPITOR) tablet 40 mg  40 mg Oral Daily Cathlyn Parsons, PA-C   40 mg at 02/11/19 0804  . calcitRIOL (ROCALTROL) capsule 0.25 mcg  0.25 mcg Oral Daily Cathlyn Parsons, PA-C   0.25 mcg at 02/11/19 3009  . calcium carbonate (TUMS - dosed in mg elemental calcium) chewable tablet 200 mg of elemental calcium  1 tablet Oral BID WC Angiulli, Daniel J, PA-C   200 mg of elemental calcium at 02/10/19 1842  . citalopram (CELEXA) tablet 40 mg  40 mg Oral Daily Cathlyn Parsons, PA-C   40 mg at 02/11/19 0805  . collagenase (SANTYL) ointment   Topical Daily Angiulli, Lavon Paganini, PA-C      . [START ON 02/14/2019] Darbepoetin Alfa (ARANESP)  injection 100 mcg  100 mcg Intravenous Q Tue-HD Angiulli, Lavon Paganini, PA-C      . ferric gluconate (NULECIT) 62.5 mg in sodium chloride 0.9 % 100 mL IVPB  62.5 mg Intravenous Q T,Th,Sa-HD Angiulli, Lavon Paganini, PA-C      . gabapentin (NEURONTIN) capsule 300 mg  300 mg Oral QHS AngiulliLavon Paganini, PA-C   300 mg at 02/10/19 2136  . heparin injection 2,800 Units  20 Units/kg Dialysis PRN Rexene Agent, MD      . heparin injection 2,800 Units  20 Units/kg Dialysis PRN Rexene Agent, MD      . heparin injection 5,000 Units  5,000 Units Subcutaneous Q8H Cathlyn Parsons, PA-C   5,000 Units at 02/10/19 2136  . hydrOXYzine (ATARAX/VISTARIL) tablet 25 mg  25 mg Oral Q8H PRN Cathlyn Parsons, PA-C      . Melatonin TABS 4.5 mg  4.5 mg Oral QHS AngiulliLavon Paganini, PA-C   4.5 mg at 02/10/19 2135  . pantoprazole (PROTONIX) EC tablet 40 mg  40 mg Oral Daily Cathlyn Parsons, PA-C   40 mg at 02/11/19 0805  . polyethylene glycol (MIRALAX / GLYCOLAX) packet 17 g  17 g Oral Daily Love, Ivan Anchors, PA-C  17 g at 02/11/19 0045  . sodium phosphate (FLEET) 7-19 GM/118ML enema 1 enema  1 enema Rectal Daily PRN Love, Pamela S, PA-C      . sorbitol 70 % solution 30 mL  30 mL Oral Daily PRN Cathlyn Parsons, PA-C   30 mL at 02/11/19 0517  . traMADol (ULTRAM) tablet 50 mg  50 mg Oral Q6H PRN Cathlyn Parsons, PA-C   50 mg at 02/10/19 2136     Objective: Vital signs in last 24 hours: Temp:  [98.2 F (36.8 C)-99.2 F (37.3 C)] 99.2 F (37.3 C) (07/25 0449) Pulse Rate:  [79-81] 79 (07/25 0449) Resp:  [16-20] 18 (07/25 0024) BP: (116-128)/(71-88) 122/82 (07/25 0804) SpO2:  [88 %-96 %] 88 % (07/25 0449)  Intake/Output from previous day: 07/24 0701 - 07/25 0700 In: 438 [P.O.:438] Out: 150 [Urine:150] Intake/Output this shift: Total I/O In: 240 [P.O.:240] Out: -    Physical Exam Vitals signs reviewed.  Constitutional:      Appearance: Normal appearance. He is obese.  Genitourinary:    Comments: Marked  penoscrotal edema with a foley at the meatus and a small amount of clear urine in the bag.  Neurological:     Mental Status: He is alert.     Lab Results:  Recent Labs    02/09/19 1500  WBC 15.8*  HGB 6.6*  HCT 21.8*  PLT 188   BMET Recent Labs    02/09/19 1500  CREATININE 6.42*   PT/INR No results for input(s): LABPROT, INR in the last 72 hours. ABG No results for input(s): PHART, HCO3 in the last 72 hours.  Invalid input(s): PCO2, PO2  Studies/Results: Ct Abdomen Pelvis Wo Contrast  Result Date: 02/11/2019 CLINICAL DATA:  48 year old male with history of diffuse abdominal pain. EXAM: CT ABDOMEN AND PELVIS WITHOUT CONTRAST TECHNIQUE: Multidetector CT imaging of the abdomen and pelvis was performed following the standard protocol without IV contrast. COMPARISON:  CT the abdomen and pelvis 07/31/2017. FINDINGS: Lower chest: Small bilateral pleural effusions lying dependently with associated passive subsegmental atelectasis in the lower lobes of the lungs bilaterally. Hepatobiliary: No definite suspicious cystic or solid hepatic lesions are confidently identified on today's noncontrast CT examination. Amorphous dependent high attenuation material in the gallbladder, compatible with tiny calcified gallstones. Gallbladder is not distended. No pericholecystic fluid or inflammatory changes. Pancreas: No pancreatic mass. No pancreatic ductal dilatation. No pancreatic or peripancreatic fluid collections or inflammatory changes. Spleen: Unremarkable. Adrenals/Urinary Tract: No calcifications are identified within the collecting system of either kidney, along the course of either ureter, or within the lumen of the urinary bladder. Unenhanced appearance of the kidneys and bilateral adrenal glands is normal. Foley balloon catheter within the urinary bladder which appears nearly completely decompressed. Small amount of gas in the urinary bladder is iatrogenic. Stomach/Bowel: Normal appearance of  the stomach. No pathologic dilatation of small bowel or colon. Normal appendix. Vascular/Lymphatic: Aortic atherosclerosis. No lymphadenopathy noted in the abdomen or pelvis. Reproductive: Prostate gland and seminal vesicles are unremarkable in appearance. Other: Trace volume of ascites. Mild diffuse mesenteric edema. No pneumoperitoneum. Musculoskeletal: Severe diffuse body wall edema. Severe scrotal edema (incompletely imaged). There are no aggressive appearing lytic or blastic lesions noted in the visualized portions of the skeleton. IMPRESSION: 1. There is a spectrum of findings, compatible with a state of anasarca, including bilateral pleural effusions, diffuse body wall edema, scrotal edema and trace volume of ascites. 2. No acute findings are noted in the abdomen or pelvis. Small amount  of tiny calcified gallstones lying dependently in the gallbladder. No findings to suggest an acute cholecystitis at this time. 3. Normal appendix. 4. Aortic atherosclerosis. 5. Additional incidental findings, as above. Electronically Signed   By: Vinnie Langton M.D.   On: 02/11/2019 07:33   US Pelvis (transabdominal Only)  Result Date: 02/10/2019 CLINICAL DATA:  Urinary retention.  Assess for bladder volume. EXAM: ULTRASOUND OF THE MALE PELVIS COMPARISON:  None. FINDINGS: Bladder: Bladder volume 580 cc. Prostate gland: 2.7 x 2.5 x 1.5 cm, volume 5.3 mm. Seminal vesicles:  Not visualized. IMPRESSION: Bladder volume of 580 cc. Electronically Signed   By: Keith Rake M.D.   On: 02/10/2019 01:15     Assessment and Plan:  Difficult foley placement with concern for improper placement.  CT confirms the foley is in good position and the bladder is decompressed.       LOS: 3 days    Irine Seal 02/11/2019 [phone]

## 2019-02-11 NOTE — Progress Notes (Signed)
Occupational Therapy Session Note  Patient Details  Name: Michael Riggs MRN: [id number] Date of Birth: Nov 03, 1970  Today's Date: 02/11/2019 OT Individual Time: 1300-1309 OT Individual Time Calculation (min): 9 min  and Today's Date: 02/11/2019 OT Missed Time: 36 Minutes Missed Time Reason: Pain   Short Term Goals: Week 1:  OT Short Term Goal 1 (Week 1): Pt will tolerate sitting EOB wiht MIN A for sitting balance for 10 min during functional activity OT Short Term Goal 2 (Week 1): Pt will transfer to [facility] with MOD A of 1 caregiver to decrease caregiver burden OT Short Term Goal 3 (Week 1): Pt will don sock with AE PRN and min VC for technique OT Short Term Goal 4 (Week 1): Pt will bathe with MIN A  Skilled Therapeutic Interventions/Progress Updates:    Pt greeted semi-reclined in bed, still reporting 10/10 pain in scrotum and bladder. Pt declined OOB activity. OT cut tubigrip to size and placed gentle compression on scrotum for scrotal edema. HOB lowered to pt comfort and lower part of bed raised to elevate scrotum slightly. Pt left in this position in comfort with bed alarm on and needs  Met. Pt missed 36 minutes of skill OT treatment session. Will follow up per plan of care.   Therapy Documentation Precautions:  Precautions Precautions: Fall, Other (comment) Precaution Comments: L BKA with prosthesis; significant scrotal edema Restrictions Weight Bearing Restrictions: No General: General OT Amount of Missed Time: 36 Minutes OT Missed Treatment Reason: Pain and fatigue Pain: Pain Assessment Pain Scale: 0-10 Pain Score: 10 Pain in scrotum and bottom. Repositioned and compression applied.   Therapy/Group: Individual Therapy  Valma Cava 02/11/2019, 1:30 PM

## 2019-02-11 NOTE — Progress Notes (Signed)
Michael Riggs is a 48 y.o. male 12/20/1970 [id number]  Subjective: No new complaints. The patient is still very upset about his bladder problem.  Urinary output is decreased.  Abdomen feels bloated.  He had a bowel movement this morning  Objective: Vital signs in last 24 hours: Temp:  [98.2 F (36.8 C)-99.2 F (37.3 C)] 99.2 F (37.3 C) (07/25 0449) Pulse Rate:  [79-81] 79 (07/25 0449) Resp:  [16-20] 18 (07/25 0024) BP: (116-128)/(71-88) 122/82 (07/25 0804) SpO2:  [88 %-96 %] 88 % (07/25 0449) Weight change:  Last BM Date: 02/11/19  Intake/Output from previous day: 07/24 0701 - 07/25 0700 In: 438 [P.O.:438] Out: 150 [Urine:150] Last cbgs: CBG (last 3)  Recent Labs    02/09/19 2109 02/10/19 0633 02/10/19 1612  GLUCAP 159* 121* 142*     Physical Exam General: No apparent distress.  Obese HEENT: Somewhat dry Lungs: Normal effort. Lungs clear to auscultation, no crackles or wheezes. Cardiovascular: Regular rate and rhythm, no edema Abdomen: S/ND; BS(+).  The abdomen is obese, sensitive to palpation in the lower half.  Foley catheter is present.  Genitals are swollen, especially scrotum.  Scant amount of pink urine in the Foley bag Musculoskeletal:  unchanged Neurological: No new neurological deficits Wounds: Clean, dressed Skin: clear  Mental state: Alert, oriented, cooperative    Lab Results: BMET    Component Value Date/Time   NA 130 (L) 02/08/2019 0622   K 4.0 02/08/2019 0622   CL 97 (L) 02/08/2019 0622   CO2 24 02/08/2019 0622   GLUCOSE 137 (H) 02/08/2019 0622   BUN 22 (H) 02/08/2019 0622   CREATININE 6.42 (H) 02/09/2019 1500   CALCIUM 7.1 (L) 02/08/2019 0622   GFRNONAA 9 (L) 02/09/2019 1500   GFRAA 11 (L) 02/09/2019 1500   CBC    Component Value Date/Time   WBC 15.8 (H) 02/09/2019 1500   RBC 2.18 (L) 02/09/2019 1500   HGB 6.6 (LL) 02/09/2019 1500   HCT 21.8 (L) 02/09/2019 1500   PLT 188 02/09/2019 1500   MCV 100.0 02/09/2019 1500   MCH  30.3 02/09/2019 1500   MCHC 30.3 02/09/2019 1500   RDW 12.4 02/09/2019 1500   LYMPHSABS 1.4 02/09/2019 1500   MONOABS 1.6 (H) 02/09/2019 1500   EOSABS 0.8 (H) 02/09/2019 1500   BASOSABS 0.1 02/09/2019 1500    Studies/Results: Ct Abdomen Pelvis Wo Contrast  Result Date: 02/11/2019 CLINICAL DATA:  48 year old male with history of diffuse abdominal pain. EXAM: CT ABDOMEN AND PELVIS WITHOUT CONTRAST TECHNIQUE: Multidetector CT imaging of the abdomen and pelvis was performed following the standard protocol without IV contrast. COMPARISON:  CT the abdomen and pelvis 07/31/2017. FINDINGS: Lower chest: Small bilateral pleural effusions lying dependently with associated passive subsegmental atelectasis in the lower lobes of the lungs bilaterally. Hepatobiliary: No definite suspicious cystic or solid hepatic lesions are confidently identified on today's noncontrast CT examination. Amorphous dependent high attenuation material in the gallbladder, compatible with tiny calcified gallstones. Gallbladder is not distended. No pericholecystic fluid or inflammatory changes. Pancreas: No pancreatic mass. No pancreatic ductal dilatation. No pancreatic or peripancreatic fluid collections or inflammatory changes. Spleen: Unremarkable. Adrenals/Urinary Tract: No calcifications are identified within the collecting system of either kidney, along the course of either ureter, or within the lumen of the urinary bladder. Unenhanced appearance of the kidneys and bilateral adrenal glands is normal. Foley balloon catheter within the urinary bladder which appears nearly completely decompressed. Small amount of gas in the urinary bladder is iatrogenic. Stomach/Bowel: Normal appearance  of the stomach. No pathologic dilatation of small bowel or colon. Normal appendix. Vascular/Lymphatic: Aortic atherosclerosis. No lymphadenopathy noted in the abdomen or pelvis. Reproductive: Prostate gland and seminal vesicles are unremarkable in  appearance. Other: Trace volume of ascites. Mild diffuse mesenteric edema. No pneumoperitoneum. Musculoskeletal: Severe diffuse body wall edema. Severe scrotal edema (incompletely imaged). There are no aggressive appearing lytic or blastic lesions noted in the visualized portions of the skeleton. IMPRESSION: 1. There is a spectrum of findings, compatible with a state of anasarca, including bilateral pleural effusions, diffuse body wall edema, scrotal edema and trace volume of ascites. 2. No acute findings are noted in the abdomen or pelvis. Small amount of tiny calcified gallstones lying dependently in the gallbladder. No findings to suggest an acute cholecystitis at this time. 3. Normal appendix. 4. Aortic atherosclerosis. 5. Additional incidental findings, as above. Electronically Signed   By: Vinnie Langton M.D.   On: 02/11/2019 07:33   US Pelvis (transabdominal Only)  Result Date: 02/10/2019 CLINICAL DATA:  Urinary retention.  Assess for bladder volume. EXAM: ULTRASOUND OF THE MALE PELVIS COMPARISON:  None. FINDINGS: Bladder: Bladder volume 580 cc. Prostate gland: 2.7 x 2.5 x 1.5 cm, volume 5.3 mm. Seminal vesicles:  Not visualized. IMPRESSION: Bladder volume of 580 cc. Electronically Signed   By: Keith Rake M.D.   On: 02/10/2019 01:15    Medications: I have reviewed the patient's current medications.  Assessment/Plan:  1.  Debility secondary to end-stage renal disease and other medical problems.  Currently on hemodialysis.  Continue CIR 2.  Anticoagulation with the subcu heparin for DVT prophylaxis 3.  Pain management with gabapentin and tramadol 4.  Depression.  On Celexa.  Melatonin at night 5.  The patient is capable of making his own decisions 6.  Wound care.  Skin care 7.  End-stage renal disease.  Status post IV fistula on 01/09/2019.  On hemodialysis.  Oliguria 8.  Diabetes mellitus.  Recent hemoglobin A1c was 5.0 9.  Anemia of chronic disease.  On Aranesp.  Transfusion if  needed per nephrology 10.  Status post left BKA 5 years ago 70.  Hypertension.  He is on Norvasc 10 mg daily 12.  Morbid obesity.  BMI is 43.78.  Dietary follow-up 13.  Hyperlipidemia on Lipitor 14.  History of syphilis.  Treated outpatient with penicillin 15.  Urinary retention.  He had a catheter placed with difficulty.  He was seen by Dr. Jeffie Pollock this morning who confirmed a proper placement of the catheter.  His help is very appreciated 16.  Elevated white blood cell count.  Continue to monitor 17.  Severe scrotal edema.  Fluid management per nephrology   Length of stay, days: 3  Walker Kehr , MD 02/11/2019, 10:43 AM

## 2019-02-11 NOTE — Progress Notes (Signed)
Dr. Jeffie Pollock, urologist on call contacted per Dr. Inda Merlin and updated on patient's current condition. Received order for CT of abdomen/pelvis without contrast to verify foley catheter placement and verify bladder volume. Per Dr. Jeffie Pollock, will be in later today to followup with patient.

## 2019-02-12 DIAGNOSIS — R339 Retention of urine, unspecified: Secondary | ICD-10-CM

## 2019-02-12 LAB — GLUCOSE, CAPILLARY: Glucose-Capillary: 153 mg/dL — ABNORMAL HIGH (ref 70–99)

## 2019-02-12 MED ORDER — TRAMADOL HCL 50 MG PO TABS
50.0000 mg | ORAL_TABLET | Freq: Four times a day (QID) | ORAL | Status: DC | PRN
  Administered 2019-02-12 – 2019-02-13 (×4): 50 mg via ORAL
  Filled 2019-02-12 (×4): qty 1

## 2019-02-12 MED ORDER — SIMETHICONE 80 MG PO CHEW
80.0000 mg | CHEWABLE_TABLET | Freq: Four times a day (QID) | ORAL | Status: DC | PRN
  Administered 2019-02-12 – 2019-02-23 (×3): 80 mg via ORAL
  Filled 2019-02-12 (×3): qty 1

## 2019-02-12 NOTE — Progress Notes (Signed)
Admit: 02/08/2019 LOS: 4  6M recent start ESRD (at [facility]) admitted with hypervolemia, poor physical stamina  Subjective:   HD yesterday, 6L UF  C/o pain and abd bloating today, upset  07/25 0701 - 07/26 0700 In: 720 [P.O.:720] Out: 6075 [Urine:75]  Filed Weights   02/09/19 2108 02/10/19 0458 02/11/19 1825  Weight: (!) 146.2 kg (!) 139.4 kg 134 kg    Scheduled Meds: . sodium chloride   Intravenous Once  . amLODipine  10 mg Oral Daily  . atorvastatin  40 mg Oral Daily  . calcitRIOL  0.25 mcg Oral Daily  . calcium carbonate  1 tablet Oral BID WC  . citalopram  40 mg Oral Daily  . collagenase   Topical Daily  . [START ON 02/14/2019] darbepoetin (ARANESP) injection - DIALYSIS  100 mcg Intravenous Q Tue-HD  . gabapentin  300 mg Oral QHS  . heparin  5,000 Units Subcutaneous Q8H  . Melatonin  4.5 mg Oral QHS  . pantoprazole  40 mg Oral Daily  . polyethylene glycol  17 g Oral Daily   Continuous Infusions: . ferric gluconate (FERRLECIT/NULECIT) IV 62.5 mg (02/11/19 1657)   PRN Meds:.acetaminophen **OR** acetaminophen, albuterol, hydrOXYzine, simethicone, sodium phosphate, sorbitol, traMADol  Current Labs: reviewed   Physical Exam:  Blood pressure (!) 148/98, pulse 78, temperature 98.9 F (37.2 C), temperature source Oral, resp. rate 16, height 5\' 10"  (1.778 m), weight 134 kg, SpO2 98 %. Pleasant somnolent, RRR nl s1s2 L 1st stage BC AVF +B/T 3+ LEE, pitting in RLE, stump swollen CTAB Nonfocal, AAO x3 EOMI  A 1. ESRD, new, for [facility] upon DC 2. HTN / Vol: sig edema, cont to push down post HD weights -- not consistently recorded 3. S/p L BC AVF, will need 2nd stage using TDC 4. Ambulatory dysfunction, PT/OT, in CIR 5. CKD BMD: cont C3, PTH ok 6. Anemia: On Aranesp  P . HD on THS schedule: 5-6L UF, TDC, 4.5h, 2K, Tight heparin . Cont Aranesp to 200 mcg a week . Rx simethicone for him . Medication Issues; o Preferred narcotic agents for pain control are  hydromorphone, fentanyl, and methadone. Morphine should not be used.  o Baclofen should be avoided o Avoid oral sodium phosphate and magnesium citrate based laxatives / bowel preps    Pearson Grippe MD 02/12/2019, 10:51 AM  Recent Labs  Lab 02/06/19 0742 02/07/19 0034 02/08/19 0622 02/09/19 1500 02/11/19 1523  NA 131* 129* 130*  --  129*  K 3.9 3.9 4.0  --  3.9  CL 96* 96* 97*  --  94*  CO2 25 24 24   --  25  GLUCOSE 106* 168* 137*  --  168*  BUN 40* 27* 22*  --  32*  CREATININE 7.31* 5.20* 4.48* 6.42* 5.88*  CALCIUM 6.5* 6.8* 7.1*  --  7.6*  PHOS 3.7  --   --   --  3.6   Recent Labs  Lab 02/07/19 0034 02/08/19 0622 02/09/19 1500 02/11/19 1523  WBC 14.8* 14.0* 15.8* 14.0*  NEUTROABS 11.3* 9.7* 11.7*  --   HGB 7.0* 7.0* 6.6* 7.7*  HCT 22.9* 22.8* 21.8* 25.4*  MCV 96.6 98.7 100.0 95.8  PLT 120* 138* 188 220

## 2019-02-12 NOTE — Progress Notes (Addendum)
Pt has had <50 ml of urine output during the shift. Urology was informed yesterday and verified placement of foley with no obstruction. Pt is continuing to complain of lower abdominal pain and pain around the recal area. Will continue to monitor. Doy Hutching, LPN

## 2019-02-12 NOTE — Progress Notes (Addendum)
Patient refuses to keep CPAP on.   Education given of importance and safety  of using CPAP.   Respiratory called. Respiratory came to readjust machine settings and bring new mask to try to make patient more comfortable.   Patient wears CPAP for a few minutes and then removes it.   SPO2: 92   Bed in lowest position, Call bell within reach. Will continue to monitor and collect SPO2.

## 2019-02-12 NOTE — Progress Notes (Signed)
Pt refusing to wear cpap tonight. Pt stated "he would rather not." RT will continue to monitor as needed.

## 2019-02-12 NOTE — Progress Notes (Signed)
Michael Riggs is a 48 y.o. male 06-27-1971 [id number]  Subjective: The patient is complaining of severe pain all over.  He is talkative.  He is still fixated on his "horrible bladder catheterization" and his "6 pound dump that I took" and how it made "my A hole throb".  He continues to use foul language, however, does not seem to be angry.  He appears comfortable.  He tends to repeat the same story over and over again.  Objective: Vital signs in last 24 hours: Temp:  [98.4 F (36.9 C)-98.9 F (37.2 C)] 98.9 F (37.2 C) (07/26 0434) Pulse Rate:  [63-96] 78 (07/26 0434) Resp:  [16-20] 16 (07/26 0434) BP: (108-151)/(47-131) 148/98 (07/26 0747) SpO2:  [92 %-98 %] 98 % (07/26 0434) Weight:  [134 kg] 134 kg (07/25 1825) Weight change:  Last BM Date: 02/11/19  Intake/Output from previous day: 07/25 0701 - 07/26 0700 In: 720 [P.O.:720] Out: 6075 [Urine:75] Last cbgs: CBG (last 3)  Recent Labs    02/10/19 0633 02/10/19 1612 02/12/19 0611  GLUCAP 121* 142* 153*     Physical Exam General: No apparent distress.  Morbidly obese HEENT: not dry Lungs: Normal effort. Lungs clear to auscultation, no crackles or wheezes. Cardiovascular: Regular rate and rhythm, no edema Abdomen: S/NT/ND; BS(+) Musculoskeletal:  unchanged Neurological: No new neurological deficits Wounds: Clean Skin: clear   Mental state: Alert, oriented, cooperative    Lab Results: BMET    Component Value Date/Time   NA 129 (L) 02/11/2019 1523   K 3.9 02/11/2019 1523   CL 94 (L) 02/11/2019 1523   CO2 25 02/11/2019 1523   GLUCOSE 168 (H) 02/11/2019 1523   BUN 32 (H) 02/11/2019 1523   CREATININE 5.88 (H) 02/11/2019 1523   CALCIUM 7.6 (L) 02/11/2019 1523   GFRNONAA 11 (L) 02/11/2019 1523   GFRAA 12 (L) 02/11/2019 1523   CBC    Component Value Date/Time   WBC 14.0 (H) 02/11/2019 1523   RBC 2.65 (L) 02/11/2019 1523   HGB 7.7 (L) 02/11/2019 1523   HCT 25.4 (L) 02/11/2019 1523   PLT 220 02/11/2019  1523   MCV 95.8 02/11/2019 1523   MCH 29.1 02/11/2019 1523   MCHC 30.3 02/11/2019 1523   RDW 13.8 02/11/2019 1523   LYMPHSABS 1.4 02/09/2019 1500   MONOABS 1.6 (H) 02/09/2019 1500   EOSABS 0.8 (H) 02/09/2019 1500   BASOSABS 0.1 02/09/2019 1500    Studies/Results: Ct Abdomen Pelvis Wo Contrast  Result Date: 02/11/2019 CLINICAL DATA:  48 year old male with history of diffuse abdominal pain. EXAM: CT ABDOMEN AND PELVIS WITHOUT CONTRAST TECHNIQUE: Multidetector CT imaging of the abdomen and pelvis was performed following the standard protocol without IV contrast. COMPARISON:  CT the abdomen and pelvis 07/31/2017. FINDINGS: Lower chest: Small bilateral pleural effusions lying dependently with associated passive subsegmental atelectasis in the lower lobes of the lungs bilaterally. Hepatobiliary: No definite suspicious cystic or solid hepatic lesions are confidently identified on today's noncontrast CT examination. Amorphous dependent high attenuation material in the gallbladder, compatible with tiny calcified gallstones. Gallbladder is not distended. No pericholecystic fluid or inflammatory changes. Pancreas: No pancreatic mass. No pancreatic ductal dilatation. No pancreatic or peripancreatic fluid collections or inflammatory changes. Spleen: Unremarkable. Adrenals/Urinary Tract: No calcifications are identified within the collecting system of either kidney, along the course of either ureter, or within the lumen of the urinary bladder. Unenhanced appearance of the kidneys and bilateral adrenal glands is normal. Foley balloon catheter within the urinary bladder which appears nearly  completely decompressed. Small amount of gas in the urinary bladder is iatrogenic. Stomach/Bowel: Normal appearance of the stomach. No pathologic dilatation of small bowel or colon. Normal appendix. Vascular/Lymphatic: Aortic atherosclerosis. No lymphadenopathy noted in the abdomen or pelvis. Reproductive: Prostate gland and  seminal vesicles are unremarkable in appearance. Other: Trace volume of ascites. Mild diffuse mesenteric edema. No pneumoperitoneum. Musculoskeletal: Severe diffuse body wall edema. Severe scrotal edema (incompletely imaged). There are no aggressive appearing lytic or blastic lesions noted in the visualized portions of the skeleton. IMPRESSION: 1. There is a spectrum of findings, compatible with a state of anasarca, including bilateral pleural effusions, diffuse body wall edema, scrotal edema and trace volume of ascites. 2. No acute findings are noted in the abdomen or pelvis. Small amount of tiny calcified gallstones lying dependently in the gallbladder. No findings to suggest an acute cholecystitis at this time. 3. Normal appendix. 4. Aortic atherosclerosis. 5. Additional incidental findings, as above. Electronically Signed   By: Vinnie Langton M.D.   On: 02/11/2019 07:33    Medications: I have reviewed the patient's current medications.  Assessment/Plan:  1.  Debility secondary to end-stage renal disease and other medical problems.  Currently on hemodialysis.  Continue CIR 2.  Anticoagulation with heparin for DVT prophylaxis 3.  Pain management with gabapentin and tramadol. Per nephrology: Preferred narcotic agents for pain control are hydromorphone, fentanyl, and methadone. Morphine should not be used.  Baclofen should be avoided 4.  Depression.  On citalopram.  Melatonin as needed 5.  The patient is capable of making his own decisions 6.  Wound care.  Skin care per routine 7.  End-stage renal disease.  Status post IV fistula placement on 01/09/2019.  On hemodialysis.  Oliguria  is persistent and related to his renal failure 8.  Type 2 diabetes.  His recent A1c was 5.0% 9.  Anemia of chronic disease.  On Aranesp.  Transfusions if needed per nephrology 10.  Status post left BKA 5 years ago 35.  Hypertension.  He is on Norvasc 10 mg daily 12.  Morbid obesity his BMI is 43.78.  Dietary  follow-up 13.  Hyperlipidemia.  On Lipitor 14.  History of syphilis.  Treated outpatient with penicillin 15.  Urinary retention.  He had a catheter placed with difficulty.  He was seen by Dr. Jeffie Pollock yesterday who confirmed appropriate placement of the catheter.  Dr. Ralene Muskrat help is very appreciated.  There is no urinary obstruction present any longer.  The patient has decreased urinary output is related to his renal failure 16.  Elevated white cell count.  Continue to monitor 17.  Severe scrotal edema.  Fluid management per nephrology     Length of stay, days: [place] , MD 02/12/2019, 10:34 AM

## 2019-02-13 ENCOUNTER — Inpatient Hospital Stay (HOSPITAL_COMMUNITY): Payer: Medicare Other | Admitting: Physical Therapy

## 2019-02-13 ENCOUNTER — Inpatient Hospital Stay (HOSPITAL_COMMUNITY): Payer: Medicare Other | Admitting: Occupational Therapy

## 2019-02-13 LAB — URINALYSIS, COMPLETE (UACMP) WITH MICROSCOPIC
Bilirubin Urine: NEGATIVE
Glucose, UA: 150 mg/dL — AB
Ketones, ur: NEGATIVE mg/dL
Nitrite: NEGATIVE
Protein, ur: >=300 mg/dL — AB
RBC / HPF: >50 RBC/hpf — ABNORMAL HIGH (ref 0–5)
Specific Gravity, Urine: 1.022 (ref 1.005–1.030)
pH: 5 (ref 5.0–8.0)

## 2019-02-13 MED ORDER — SODIUM CHLORIDE 0.9 % IV SOLN
1.0000 g | INTRAVENOUS | Status: DC
  Administered 2019-02-13 – 2019-02-20 (×8): 1 g via INTRAVENOUS
  Filled 2019-02-13 (×7): qty 1
  Filled 2019-02-13: qty 10
  Filled 2019-02-13: qty 1

## 2019-02-13 NOTE — Progress Notes (Signed)
Patient refused CPAP for tonight 

## 2019-02-13 NOTE — Progress Notes (Signed)
Occupational Therapy Note  Patient Details  Name: Michael Riggs MRN: [id number] Date of Birth: 1971-02-18  Today's Date: 02/13/2019 OT Missed Time: 15 Minutes Missed Time Reason: Pain  Pt resting in bed upon arrival.  Pt c/o ongoing pain in scrotum and RLE.  Attempted to engage pt in EOB tasks and/or bathing at bed level.  Pt persistent that he couldn't because of his pain.  Pt missed 20 mins skilled OT services 2/2 pain.   Leotis Shames [facility] 02/13/2019, 10:07 AM

## 2019-02-13 NOTE — Progress Notes (Addendum)
Physical Therapy Session Note  Patient Details  Name: Michael Riggs MRN: [id number] Date of Birth: 14-Jan-1971  Today's Date: 02/13/2019 PT Individual Time: 1300-1325 PT Individual Time Calculation (min): 25 min  and Today's Date: 02/13/2019 PT Missed Time: 35 Minutes Missed Time Reason: Pain  Short Term Goals: Week 1:  PT Short Term Goal 1 (Week 1): Pt will be able to perform bed mobility with max assist PT Short Term Goal 2 (Week 1): Pt will be able to perform functional transfer bed <> w/c with max assist PT Short Term Goal 3 (Week 1): Pt will be able to perform w/c propulsion for functional mobility with supervision x 50'  Skilled Therapeutic Interventions/Progress Updates:   Pt asleep in supine, easily woken up. Pt also noted to be slurring his speech and he was difficult to understand at first. Pt more alert and speech more clear as conversation went on. Per RN and PA, this is pt's baseline mental status. Pt denied pain at rest but stated "as soon as I move, my butt will be hurting a whole lot". Pt described pain felt to be in gluteal fold. Educated on bed positioning techniques for pain relief including supported sidelying and hooklying. During discussion, pt began yelling out in pain. Attempted to reposition into sidelying for comfort, however pt unable to tolerate position 2/2 scrotal edema. Relief achieved in hooklying position. Also educated on positioning and supine therex for LE/scrotal swelling management. Pt verbalized understanding and in agreement to try whatever would relieve his pain. Once pain subsided, pt refused further intervention 2/2 fear of pain w/ sitting up to EOB despite encouragement. Pt stated "I can't do anything when I'm in this pain". Attempted supine therex, however pt falling asleep and unable to meaningfully participate despite stimulation. Missed 35 min of skilled PT 2/2 pain.   Therapy Documentation Precautions:  Precautions Precautions: Fall, Other  (comment) Precaution Comments: L BKA with prosthesis; significant scrotal edema Restrictions Weight Bearing Restrictions: No Pain: Pain Assessment Pain Scale: 0-10 Pain Score: 10-Worst pain ever Faces Pain Scale: Hurts worst Pain Type: Acute pain Pain Location: Buttocks Pain Orientation: Medial Multiple Pain Sites: No  Therapy/Group: Individual Therapy  [name] [name] 02/13/2019, 1:27 PM

## 2019-02-13 NOTE — Progress Notes (Signed)
Occupational Therapy Session Note  Patient Details  Name: Michael Riggs MRN: [id number] Date of Birth: 1970/08/04  Today's Date: 02/13/2019 OT Individual Time: 0721-8288 OT Individual Time Calculation (min): 27 min    Short Term Goals: Week 1:  OT Short Term Goal 1 (Week 1): Pt will tolerate sitting EOB wiht MIN A for sitting balance for 10 min during functional activity OT Short Term Goal 2 (Week 1): Pt will transfer to [facility] with MOD A of 1 caregiver to decrease caregiver burden OT Short Term Goal 3 (Week 1): Pt will don sock with AE PRN and min VC for technique OT Short Term Goal 4 (Week 1): Pt will bathe with MIN A  Skilled Therapeutic Interventions/Progress Updates:    Pt greeted semi-reclined in bed, reporting 10/10 pain in his bottom and abdomen. Pt initially agreeable to try to sit EOB with max encouragement. Pt began advancing towards EOB, then stopped and began yelling in pain saying it was gas pain. Pt declined to attempt sitting EOB again despite encouragement. Pt agreeable to bed level UB there-ex. OT administered orange level 2 theraband. Pt completed 3 sets of 10 bicep curls, chest pull, and horizontal shoulder pulls. Pt left semi-reclined in bed with neeeds met.  Therapy Documentation Precautions:  Precautions Precautions: Fall, Other (comment) Precaution Comments: L BKA with prosthesis; significant scrotal edema Restrictions Weight Bearing Restrictions: No Pain: Pain Assessment Pain Scale: 0-10 Pain Score: 10  Pain Type: Acute pain Pain Location: Bottom Pain Descriptors / Indicators: Aching;Discomfort Pain Frequency: Constant Pain Onset: On-going Patients Stated Pain Goal: 3 Pain Intervention(s): Repositioned  Therapy/Group: Individual Therapy  Valma Cava 02/13/2019, 8:51 AM

## 2019-02-13 NOTE — Progress Notes (Signed)
Occupational Therapy Session Note  Patient Details  Name: Michael Riggs MRN: [id number] Date of Birth: 12-29-1970  Today's Date: 02/13/2019 OT Individual Time: 4388-8757 OT Individual Time Calculation (min): 10 min  and Today's Date: 02/13/2019 OT Missed Time: 65 Minutes Missed Time Reason: Pain   Short Term Goals: Week 1:  OT Short Term Goal 1 (Week 1): Pt will tolerate sitting EOB wiht MIN A for sitting balance for 10 min during functional activity OT Short Term Goal 2 (Week 1): Pt will transfer to [facility] with MOD A of 1 caregiver to decrease caregiver burden OT Short Term Goal 3 (Week 1): Pt will don sock with AE PRN and min VC for technique OT Short Term Goal 4 (Week 1): Pt will bathe with MIN A  Skilled Therapeutic Interventions/Progress Updates:    Attempted to engage pt in any functional activity from bathing at bed level to any EOB or OOB activity.  Pt reports extreme pain and refusing any functional tasks, despite encouragement.  Discussed pain management with pt willing to attempt tylenol until able to take next dose of tramadol to attempt to decrease pain.  Pt continuing to refuse any mobility even at bed level.    Returned later to attempt to engage pt in therapeutic activity.  Pt continuing to refuse at this time due to pain.  Discussed with RN.  Therapy Documentation Precautions:  Precautions Precautions: Fall, Other (comment) Precaution Comments: L BKA with prosthesis; significant scrotal edema Restrictions Weight Bearing Restrictions: No General: General OT Amount of Missed Time: 65 Minutes Pain: Pain Assessment Pain Scale: 0-10 Pain Score: 10-Worst pain ever Faces Pain Scale: Hurts worst Pain Type: Acute pain Pain Location: Buttocks Pain Orientation: Medial Multiple Pain Sites: No   Therapy/Group: Individual Therapy  [name] 02/13/2019, 11:57 AM

## 2019-02-13 NOTE — Progress Notes (Signed)
Cross covered for patient this morning after being informed of ongoing pain. When I saw the patient at 1300 he told me he was comfortable but was "seeing birds." I had a very difficult time redirecting him or having a meaningful conversation. RN told me that tramadol was covering his pain when it became worse. He has had an ongoing leukocytosis since acute but has developed a low grade temp over 24+ hours.    Assessment:  Increased confusion/delirium  Plan:  1. Will order portable cxr and UA/UCX 2. Continue to monitor pain levels and response to tramadol 3. Spoke with nephrology today re: pain levels. Dr. Elmarie Shiley agreed that he was being very vague regarding pain and that his mental status appeared to be playing a role.   Reviewed plan with PA Dan Angiulli. Dr. Delice Lesch to see in AM  Meredith Staggers, MD, [facility] 02/13/2019

## 2019-02-13 NOTE — Progress Notes (Signed)
[name], male   DOB: 1971/05/19, 48 y.o.   MRN: [id number]  [facility] Progress Note   Assessment/ Plan:   1.  Physical debilitation/deconditioning following hospitalization resulting in initiation of hemodialysis: Currently ongoing inpatient rehabilitation. 2. ESRD: New start to hemodialysis, continue dialysis on a TTS schedule via right IJ TDC.  He is status post left first stage BBT.  Continue efforts at trying to lower dry weight by aggressive ultrafiltration with hemodialysis. 3. Anemia: Hemoglobin/hematocrit improved status post PRBC, continue to monitor trend on ESA/IV iron. 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  Do not use sodium phosphate enema. 5. Nutrition: Continue renal diet with renal multivitamin. 6.  Chronic pain: Management per PM&R service.  Subjective:   Complains of pain all over and rectal pain.  Recounts history that he has given to other providers before.   Objective:   BP 116/74 (BP Location: Right Arm)   Pulse 80   Temp 99.8 F (37.7 C) (Oral)   Resp 20   Ht 5\' 10"  (1.778 m)   Wt 134.2 kg   SpO2 94%   BMI 42.45 kg/m   Physical Exam: Gen: Comfortably resting in bed CVS: Pulse regular rhythm, normal rate, S1 and S2 normal Resp: Anteriorly clear to auscultation, no rales/rhonchi Abd: Soft, obese, nontender Ext: 2-3+ lower extremity/dependent edema  Labs: BMET Recent Labs  Lab 02/07/19 0034 02/08/19 0622 02/09/19 1500 02/11/19 1523  NA 129* 130*  --  129*  K 3.9 4.0  --  3.9  CL 96* 97*  --  94*  CO2 24 24  --  25  GLUCOSE 168* 137*  --  168*  BUN 27* 22*  --  32*  CREATININE 5.20* 4.48* 6.42* 5.88*  CALCIUM 6.8* 7.1*  --  7.6*  PHOS  --   --   --  3.6   CBC Recent Labs  Lab 02/07/19 0034 02/08/19 0622 02/09/19 1500 02/11/19 1523  WBC 14.8* 14.0* 15.8* 14.0*  NEUTROABS 11.3* 9.7* 11.7*  --   HGB 7.0* 7.0* 6.6* 7.7*  HCT 22.9* 22.8*  21.8* 25.4*  MCV 96.6 98.7 100.0 95.8  PLT 120* 138* 188 220   Medications:    . sodium chloride   Intravenous Once  . amLODipine  10 mg Oral Daily  . atorvastatin  40 mg Oral Daily  . calcitRIOL  0.25 mcg Oral Daily  . calcium carbonate  1 tablet Oral BID WC  . citalopram  40 mg Oral Daily  . collagenase   Topical Daily  . [START ON 02/14/2019] darbepoetin (ARANESP) injection - DIALYSIS  100 mcg Intravenous Q Tue-HD  . gabapentin  300 mg Oral QHS  . heparin  5,000 Units Subcutaneous Q8H  . Melatonin  4.5 mg Oral QHS  . pantoprazole  40 mg Oral Daily  . polyethylene glycol  17 g Oral Daily   Elmarie Shiley, MD 02/13/2019, 9:48 AM

## 2019-02-14 ENCOUNTER — Inpatient Hospital Stay (HOSPITAL_COMMUNITY): Payer: Medicare Other | Admitting: Physical Therapy

## 2019-02-14 ENCOUNTER — Inpatient Hospital Stay (HOSPITAL_COMMUNITY): Payer: Medicare Other

## 2019-02-14 ENCOUNTER — Inpatient Hospital Stay (HOSPITAL_COMMUNITY): Payer: Medicare Other | Admitting: Occupational Therapy

## 2019-02-14 DIAGNOSIS — I10 Essential (primary) hypertension: Secondary | ICD-10-CM

## 2019-02-14 DIAGNOSIS — N186 End stage renal disease: Secondary | ICD-10-CM

## 2019-02-14 DIAGNOSIS — G479 Sleep disorder, unspecified: Secondary | ICD-10-CM

## 2019-02-14 DIAGNOSIS — D72829 Elevated white blood cell count, unspecified: Secondary | ICD-10-CM

## 2019-02-14 DIAGNOSIS — E1165 Type 2 diabetes mellitus with hyperglycemia: Secondary | ICD-10-CM

## 2019-02-14 DIAGNOSIS — K5901 Slow transit constipation: Secondary | ICD-10-CM

## 2019-02-14 DIAGNOSIS — D638 Anemia in other chronic diseases classified elsewhere: Secondary | ICD-10-CM

## 2019-02-14 LAB — CBC
HCT: 25.6 % — ABNORMAL LOW (ref 39.0–52.0)
Hemoglobin: 7.9 g/dL — ABNORMAL LOW (ref 13.0–17.0)
MCH: 28.9 pg (ref 26.0–34.0)
MCHC: 30.9 g/dL (ref 30.0–36.0)
MCV: 93.8 fL (ref 80.0–100.0)
Platelets: 283 10*3/uL (ref 150–400)
RBC: 2.73 MIL/uL — ABNORMAL LOW (ref 4.22–5.81)
RDW: 13.2 % (ref 11.5–15.5)
WBC: 13.8 10*3/uL — ABNORMAL HIGH (ref 4.0–10.5)
nRBC: 0 % (ref 0.0–0.2)

## 2019-02-14 LAB — RENAL FUNCTION PANEL
Albumin: 1.6 g/dL — ABNORMAL LOW (ref 3.5–5.0)
Anion gap: 9 (ref 5–15)
BUN: 22 mg/dL — ABNORMAL HIGH (ref 6–20)
CO2: 27 mmol/L (ref 22–32)
Calcium: 7.9 mg/dL — ABNORMAL LOW (ref 8.9–10.3)
Chloride: 96 mmol/L — ABNORMAL LOW (ref 98–111)
Creatinine, Ser: 4.66 mg/dL — ABNORMAL HIGH (ref 0.61–1.24)
GFR calc Af Amer: 16 mL/min — ABNORMAL LOW (ref >60)
GFR calc non Af Amer: 14 mL/min — ABNORMAL LOW (ref >60)
Glucose, Bld: 120 mg/dL — ABNORMAL HIGH (ref 70–99)
Phosphorus: 2.4 mg/dL — ABNORMAL LOW (ref 2.5–4.6)
Potassium: 3.8 mmol/L (ref 3.5–5.1)
Sodium: 132 mmol/L — ABNORMAL LOW (ref 135–145)

## 2019-02-14 LAB — URINE CULTURE: Culture: <10000 — AB

## 2019-02-14 MED ORDER — POLYETHYLENE GLYCOL 3350 17 G PO PACK
17.0000 g | PACK | Freq: Two times a day (BID) | ORAL | Status: DC
  Administered 2019-02-14 – 2019-02-17 (×5): 17 g via ORAL
  Filled 2019-02-14 (×6): qty 1

## 2019-02-14 MED ORDER — HEPARIN SODIUM (PORCINE) 1000 UNIT/ML IJ SOLN
INTRAMUSCULAR | Status: AC
  Administered 2019-02-14: 3800 [IU]
  Filled 2019-02-14: qty 4

## 2019-02-14 MED ORDER — DARBEPOETIN ALFA 150 MCG/0.3ML IJ SOSY
150.0000 ug | PREFILLED_SYRINGE | INTRAMUSCULAR | Status: DC
  Administered 2019-02-14 – 2019-03-07 (×4): 150 ug via INTRAVENOUS
  Filled 2019-02-14 (×3): qty 0.3

## 2019-02-14 MED ORDER — BISACODYL 10 MG RE SUPP
10.0000 mg | Freq: Every day | RECTAL | Status: DC | PRN
  Administered 2019-02-14: 10 mg via RECTAL
  Filled 2019-02-14 (×2): qty 1

## 2019-02-14 MED ORDER — BISACODYL 5 MG PO TBEC
10.0000 mg | DELAYED_RELEASE_TABLET | Freq: Once | ORAL | Status: AC
  Administered 2019-02-14: 10 mg via ORAL
  Filled 2019-02-14: qty 2

## 2019-02-14 MED ORDER — DARBEPOETIN ALFA 150 MCG/0.3ML IJ SOSY
PREFILLED_SYRINGE | INTRAMUSCULAR | Status: AC
  Filled 2019-02-14: qty 0.3

## 2019-02-14 MED ORDER — SENNOSIDES-DOCUSATE SODIUM 8.6-50 MG PO TABS
2.0000 | ORAL_TABLET | Freq: Two times a day (BID) | ORAL | Status: DC
  Administered 2019-02-14 – 2019-02-17 (×6): 2 via ORAL
  Filled 2019-02-14 (×6): qty 2

## 2019-02-14 MED ORDER — TRAMADOL HCL 50 MG PO TABS
50.0000 mg | ORAL_TABLET | Freq: Four times a day (QID) | ORAL | Status: DC | PRN
  Administered 2019-02-15 – 2019-03-14 (×73): 50 mg via ORAL
  Filled 2019-02-14 (×72): qty 1

## 2019-02-14 NOTE — Plan of Care (Signed)
Pt therapy frequency changed to QD due to poor participation and frequent refusal of any OOB activity secondary to pain and LE and scrotal edema. Will reassess frequency of therapies if pt participation improves.   Burnard Bunting, PT, DPT

## 2019-02-14 NOTE — Progress Notes (Signed)
Pt pulled out the IV in R Forearm, blood noted on the sheets. This nurse and the tech tried to change the sheets for the pt. This nurse attempted to give the pt a suppository as requested to aid with a BM. Pt yelled at staff to leave him alone and get the hell out of his room. This nurse explained the risk and benefits to the pt about refusing the medication. Pt did not verbalize understanding, just continued to yell at staff.

## 2019-02-14 NOTE — Progress Notes (Signed)
Physical Therapy Session Note  Patient Details  Name: Michael Riggs MRN: [id number] Date of Birth: 04-07-1971  Today's Date: 02/14/2019 PT Individual Time: 0800-0840 PT Individual Time Calculation (min): 40 min  and Today's Date: 02/14/2019 PT Missed Time: 47 Minutes Missed Time Reason: Patient unwilling to participate;Pain  Short Term Goals: Week 1:  PT Short Term Goal 1 (Week 1): Pt will be able to perform bed mobility with max assist PT Short Term Goal 2 (Week 1): Pt will be able to perform functional transfer bed <> w/c with max assist PT Short Term Goal 3 (Week 1): Pt will be able to perform w/c propulsion for functional mobility with supervision x 50'  Skilled Therapeutic Interventions/Progress Updates:   Pt asleep in supine, easily woken up. Pt denies pain at rest but would then yell out in pain w/ any active movement. Pt very tangential in his speech today, perseverating on his pain and why no one is helping him. Reminded pt of the multiple options that have been offered to him to assist w/ pain likely related to constipation. Pt eventually agreeable to take suppository from RN. Pt also agreeable to attempt OOB activity w/ encouragement. Supine>sit w/ max assist, max assist to maintain static sitting w/ heavy posterior lean - suspect 2/2 scrotal edema. Pt yelling out in pain, stating "lay me down" repeatedly. Returned to supine and pt stated he felt he was having a BM. R/L rolling w/ min assist while therapist performed pericare, pt incontinent of BM. After pericare, pt continued to refuse any activity 2/2 pain in bottom. Pt would not rate pain. Ended session in R sidelying, all needs in reach.   Therapy Documentation Precautions:  Precautions Precautions: Fall, Other (comment) Precaution Comments: L BKA with prosthesis; significant scrotal edema Restrictions Weight Bearing Restrictions: No General: PT Amount of Missed Time (min): 35 Minutes PT Missed Treatment Reason:  Patient unwilling to participate;Pain  Therapy/Group: Individual Therapy  Ladasia Sircy [name] 02/14/2019, 12:50 PM

## 2019-02-14 NOTE — Progress Notes (Signed)
[name], male   DOB: 11/13/1970, 48 y.o.   MRN: [id number]  [facility] Progress Note   Assessment/ Plan:   1.  Physical debilitation/deconditioning following hospitalization resulting in initiation of hemodialysis: Currently ongoing inpatient rehabilitation, some difficulties noted with pain control/management of his mood. 2. ESRD: New start to hemodialysis, continue dialysis on a TTS schedule via right IJ TDC as his left first stage BBF matures (created 01/09/2019 by Dr. early).  Next hemodialysis will be today and will continue efforts at lowering dry weight. 3. Anemia: Hemoglobin/hematocrit improved status post PRBC, iron stores replete, will re-dose ESA today. 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  Do not use phosphate/magnesium based laxatives or enemas. 5. Nutrition: Continue renal diet with renal multivitamin. 6.  Chronic pain: Management per PM&R service.  Subjective:   States that he slept poorly overnight due to discomfort but feels better at this time after pain medications earlier.   Objective:   BP 120/70 (BP Location: Left Arm)   Pulse 82   Temp 98.3 F (36.8 C) (Oral)   Resp 20   Ht 5\' 10"  (1.778 m)   Wt 134.2 kg   SpO2 94%   BMI 42.45 kg/m   Physical Exam: Gen: Comfortably resting in bed CVS: Pulse regular rhythm, normal rate, S1 and S2 normal Resp: Anteriorly clear to auscultation, no rales/rhonchi Abd: Soft, obese, nontender Ext: 2+-3+ edema over lower back/pretibial.  Left BBT with audible bruit  Labs: BMET Recent Labs  Lab 02/08/19 0622 02/09/19 1500 02/11/19 1523  NA 130*  --  129*  K 4.0  --  3.9  CL 97*  --  94*  CO2 24  --  25  GLUCOSE 137*  --  168*  BUN 22*  --  32*  CREATININE 4.48* 6.42* 5.88*  CALCIUM 7.1*  --  7.6*  PHOS  --   --  3.6   CBC Recent Labs  Lab 02/08/19 0622 02/09/19 1500 02/11/19 1523  WBC 14.0* 15.8* 14.0*   NEUTROABS 9.7* 11.7*  --   HGB 7.0* 6.6* 7.7*  HCT 22.8* 21.8* 25.4*  MCV 98.7 100.0 95.8  PLT 138* 188 220   Medications:    . sodium chloride   Intravenous Once  . amLODipine  10 mg Oral Daily  . atorvastatin  40 mg Oral Daily  . bisacodyl  10 mg Oral Once  . calcitRIOL  0.25 mcg Oral Daily  . calcium carbonate  1 tablet Oral BID WC  . citalopram  40 mg Oral Daily  . collagenase   Topical Daily  . darbepoetin (ARANESP) injection - DIALYSIS  100 mcg Intravenous Q Tue-HD  . gabapentin  300 mg Oral QHS  . heparin  5,000 Units Subcutaneous Q8H  . Melatonin  4.5 mg Oral QHS  . pantoprazole  40 mg Oral Daily  . polyethylene glycol  17 g Oral BID  . senna-docusate  2 tablet Oral BID   Elmarie Shiley, MD 02/14/2019, 12:06 PM

## 2019-02-14 NOTE — Progress Notes (Addendum)
Physical Therapy Session Note  Patient Details  Name: Michael Riggs MRN: [id number] Date of Birth: October 29, 1970  Today's Date: 02/14/2019 PT Individual Time: 2355-7322 PT Individual Time Calculation (min): 28 min   Short Term Goals: Week 1:  PT Short Term Goal 1 (Week 1): Pt will be able to perform bed mobility with max assist PT Short Term Goal 2 (Week 1): Pt will be able to perform functional transfer bed <> w/c with max assist PT Short Term Goal 3 (Week 1): Pt will be able to perform w/c propulsion for functional mobility with supervision x 50'  Skilled Therapeutic Interventions/Progress Updates:    Pt on phone call upon therapist entering room and ignoring therapist at first when trying to engage patient to ask if he needed a few min. Pt got off the phone and starts going off tangentially in various directions about care for him (contrafictory in that "no one is helping" him but "everyone is "So great", his phone being lost, people asking him to do things all the time, why everyone wants him to get up, why he has 5 therapies a day, and intermittently throughout session appearing to hallucinate about things in his bed ("I thought there was a sponge next to my head" when nothing was there and having a conversation during bed mobility with "someone"). Attempted to redirect and educate on role of rehab unit/therapies, goals of care, and importance of mobility while encouraging to participate in EOB activities or try to get up to w/c to be able to get out of the room. Pt states " I know all this and I walked earlier to the door several times." when asked how he did it and with who, patient starts laughing hysterically and states later "I didn't really walk anywhere". Focused on rolling in the bed for pressure relief and education on risk of skin breakdown if not getting OOB regularly. Engaged in sidelying hip abduction exercises x 6 reps each side. Pt able to perform bridge with cues and roll to  either side with use of bedrails with CGA and verbal cues. In supine, performed scooting up in bed with bridging technique and using UE's to pull up on the bedrail. Agreeable to stay in sidelying for pressure relief and positioned with pillows. NT notified that after pt eats lunch in seated position in bed, to encourage R sidelying.   Therapy Documentation Precautions:  Precautions Precautions: Fall, Other (comment) Precaution Comments: L BKA with prosthesis; significant scrotal edema Restrictions Weight Bearing Restrictions: No General: PT Amount of Missed Time (min): 17 Minutes PT Missed Treatment Reason: Patient unwilling to participate;Pain   Pain:  reports 10/10 pain still in scrotum and anus. Premedicated per report and states nothing is helping.     Therapy/Group: Individual Therapy  Canary Brim Ivory Broad, PT, DPT, CBIS  02/14/2019, 12:04 PM

## 2019-02-14 NOTE — Progress Notes (Signed)
Patient returned from hemodialysis continues to be fatigued. Denies abdominal or rectal pain at time. Resting in bed at time will continue to monitor. Adria Devon, LPN

## 2019-02-14 NOTE — Progress Notes (Addendum)
Urology Progress Note   48 year old male with end-stage renal disease, morbid obesity, GERD, OSA, who was recently admitted for initiation of dialysis.  Now in rehab.  Was initially thought to be newly anuric but he is still making about 100 cc of urine daily.    Urology was consulted on 02/09/2019 due to abdominal pain and bladder scan showed 600 cc.  79 French catheter was eventually placed over wire but catheterization was extremely difficult due to edematous foreskin, edematous scrotum, pannus, severe phimosis, meatal stenosis.   Interval: Doing well with catheter.  Able to get to wheelchair today which is closer to his baseline (does not ambulation).  Normally voids sitting in his wheelchair with aid of a urinal.  Making approximately 100 cc of urine per day currently   Objective: Vital signs in last 24 hours: Temp:  [98.1 F (36.7 C)-99 F (37.2 C)] 98.1 F (36.7 C) (07/28 1927) Pulse Rate:  [77-119] 79 (07/28 1927) Resp:  [16-20] 18 (07/28 1927) BP: (96-149)/(28-118) 136/83 (07/28 1927) SpO2:  [92 %-100 %] 92 % (07/28 1927) Weight:  [131.2 kg-137.7 kg] 131.2 kg (07/28 1757)  Intake/Output from previous day: 07/27 0701 - 07/28 0700 In: 660 [P.O.:660] Out: 200 [Urine:200] Intake/Output this shift: No intake/output data recorded.  Physical Exam:  GU: Scrotal edema improved but still moderate to severe.  Edematous foreskin.  Unable to visualize meatus.  12 French catheter with clear yellow urine in the bag  Lab Results: Recent Labs    02/14/19 1946  HGB 7.9*  HCT 25.6*   BMET Recent Labs    02/14/19 1946  NA 132*  K 3.8  CL 96*  CO2 27  GLUCOSE 120*  BUN 22*  CREATININE 4.66*  CALCIUM 7.9*     Studies/Results: No results found.  Assessment/Plan:  48 y.o. male with low volume urine output in the setting of end-stage renal disease.  Extremely difficult catheterization with now 12 French Foley in place  -Recommend keeping Foley until edema improves and  patient is back at his functional baseline  -Tentative plan to remove catheter on 02/20/2019.  Patient was be agreeable to catheter placement should he fail trial void which may take 24 to 48 hours given low urine output  - If patient is discharged prior to 8/3 please page urology resident at [phone] to arrange for outpatient follow-up with [facility].   Dispo: Floor   LOS: 6 days   Tharon Aquas 02/14/2019, 9:36 PM

## 2019-02-14 NOTE — Progress Notes (Signed)
Occupational Therapy Session Note  Patient Details  Name: Michael Riggs MRN: [id number] Date of Birth: 1970-09-09  Today's Date: 02/14/2019 OT Individual Time: 6599-3570 OT Individual Time Calculation (min): 54 min    Short Term Goals: Week 1:  OT Short Term Goal 1 (Week 1): Pt will tolerate sitting EOB wiht MIN A for sitting balance for 10 min during functional activity OT Short Term Goal 2 (Week 1): Pt will transfer to [facility] with MOD A of 1 caregiver to decrease caregiver burden OT Short Term Goal 3 (Week 1): Pt will don sock with AE PRN and min VC for technique OT Short Term Goal 4 (Week 1): Pt will bathe with MIN A  Skilled Therapeutic Interventions/Progress Updates:    Treatment session with focus on activity tolerance and participation. Pt received in sidelying asleep, requiring increased time to fully arouse.  Pt speaking garbled until fully aroused.  Pt initially attempting to refuse therapy due to need to have BM.  Max encouragement to participate in mobility to assist with evacuating bowels.  Ultimately agreeable to utilize lift to transfer OOB to toilet.  Pt with increased complaints of pain in scrotum during transfer to toilet, however able to have continent BM on BSC.  Returned to bed and engaged in rolling Mod assist, cues for hand placement, to complete hygiene total assist.  Pt more alert and willing to attempt eating.  Left semi-reclined in bed (to tolerance) to engage in eating breakfast.    Therapy Documentation Precautions:  Precautions Precautions: Fall, Other (comment) Precaution Comments: L BKA with prosthesis; significant scrotal edema Restrictions Weight Bearing Restrictions: No Pain:  Pt with c/o pain in scrotum and buttocks, however tolerated mobility and hygiene post BM.   Therapy/Group: Individual Therapy  [name] 02/14/2019, 11:14 AM

## 2019-02-14 NOTE — Progress Notes (Signed)
Pt continues to yell loudly from his room. Pt is requesting to talk to any doctor in the hospital because he wants to leave to go to another facility where the people care. Pt also stated " I would rather kill my self than be in this pain". This nurse informed the pt about contacting the on call provider for a solution to the problem and pain control. Pt does not have a plan to self hard at this time. Will continue to monitor, and wait for further instructions from the on call provider.

## 2019-02-14 NOTE — Progress Notes (Addendum)
[place] PHYSICAL MEDICINE & REHABILITATION PROGRESS NOTE   Subjective/Complaints: Patient seen laying in bed this morning.  He states he did not sleep well overnight with limited vocalization and mumbling initially.  Then stated that he wished to kill himself.  No other reported mention of this, and patient appears more agitated and angry versus willing to inflict self-harm.  Then, patient became immediately agitated and cursing regarding his bottom hurting.  Discussed with therapies as well.  Prolonged discussion with sister-in-law yesterday regarding several psychosocial as well as medical issues.  ROS: + Bottom pain today.  Denies CP, SOB, N/V/D  Objective:   No results found. Recent Labs    02/11/19 1523  WBC 14.0*  HGB 7.7*  HCT 25.4*  PLT 220   Recent Labs    02/11/19 1523  NA 129*  K 3.9  CL 94*  CO2 25  GLUCOSE 168*  BUN 32*  CREATININE 5.88*  CALCIUM 7.6*    Intake/Output Summary (Last 24 hours) at 02/14/2019 0827 Last data filed at 02/14/2019 1308 Gross per 24 hour  Intake 420 ml  Output 200 ml  Net 220 ml     Physical Exam: Vital Signs Blood pressure 120/70, pulse 82, temperature 98.3 F (36.8 C), temperature source Oral, resp. rate 20, height 5\' 10"  (1.778 m), weight 134.2 kg, SpO2 94 %. Constitutional: No distress . Vital signs reviewed.  Morbidly obese. HENT: Normocephalic.  Atraumatic. Eyes: EOMI. No discharge. Cardiovascular: No JVD. Respiratory: Normal effort. GI: Non-distended. GU: Foley in place Musc: Bilateral lower extremity and scrotal edema Neurological: Alert and oriented Motor: Bilateral upper extremities 4/5 (limited by participation).  RLE: 4-/5 proximal to distal Left lower extremity: Hip flexion, knee extension 4/5 Skin: Skin iswarm. He isnot diaphoretic. Left BKA healed.  RLE wound with dressing C/D/I  Psychiatric: Agitated   Assessment/Plan: 1. Functional deficits secondary to debility which require 3+ hours per day of  interdisciplinary therapy in a comprehensive inpatient rehab setting.  Physiatrist is providing close team supervision and 24 hour management of active medical problems listed below.  Physiatrist and rehab team continue to assess barriers to discharge/monitor patient progress toward functional and medical goals  Care Tool:  Bathing    Body parts bathed by patient: Right arm, Left arm, Chest, Abdomen, Face   Body parts bathed by helper: Front perineal area, Buttocks, Right upper leg, Left upper leg, Right lower leg Body parts n/a: Left lower leg   Bathing assist Assist Level: Maximal Assistance - Patient 24 - 49%     Upper Body Dressing/Undressing Upper body dressing Upper body dressing/undressing activity did not occur (including orthotics): N/A(patient already had a gown) What is the patient wearing?: Hospital gown only    Upper body assist Assist Level: Minimal Assistance - Patient > 75%    Lower Body Dressing/Undressing Lower body dressing    Lower body dressing activity did not occur: Refused What is the patient wearing?: Hospital gown only     Lower body assist       Toileting Toileting Toileting Activity did not occur (Clothing management and hygiene only): Safety/medical concerns  Toileting assist Assist for toileting: Dependent - Patient 0%     Transfers Chair/bed transfer  Transfers assist  Chair/bed transfer activity did not occur: Safety/medical concerns  Chair/bed transfer assist level: Moderate Assistance - Patient 50 - 74%     Locomotion Ambulation   Ambulation assist   Ambulation activity did not occur: Safety/medical concerns(unable due to pain and scrotal and bladder issues)  Walk 10 feet activity   Assist  Walk 10 feet activity did not occur: Safety/medical concerns(unable due to pain and scrotal and bladder issues)        Walk 50 feet activity   Assist Walk 50 feet with 2 turns activity did not occur: Safety/medical  concerns(unable due to pain and scrotal and bladder issues)         Walk 150 feet activity   Assist Walk 150 feet activity did not occur: Safety/medical concerns(unable due to pain and scrotal and bladder issues)         Walk 10 feet on uneven surface  activity   Assist Walk 10 feet on uneven surfaces activity did not occur: Safety/medical concerns(unable due to pain and scrotal and bladder issues)         Wheelchair     Assist     Wheelchair activity did not occur: Safety/medical concerns(unable due to pain and scrotal and bladder issues)         Wheelchair 50 feet with 2 turns activity    Assist    Wheelchair 50 feet with 2 turns activity did not occur: Safety/medical concerns(unable due to pain and scrotal and bladder issues)       Wheelchair 150 feet activity     Assist Wheelchair 150 feet activity did not occur: Safety/medical concerns(unable due to pain and scrotal and bladder issues)        Medical Problem List and Plan: 1.Debilitysecondary to end-stage renal disease/multi-medical. Hemodialysis recently initiated.  Continue CIR  Notes reviewed, including weekend notes as well as history from family- debility due to kidney disease and wound issues, labs personally reviewed. 2. Antithrombotics: -DVT/anticoagulation:Subcutaneous heparin -antiplatelet therapy: N/A 3. Pain Management:Neurontin 300 mg nightly, Ultram as needed  Pain appears to be persistent issue, however transient in location 4. Mood:Celexa 40 mg daily -antipsychotic agents: N/A  Does not appear to have true suicidal ideation without a plan and more agitated and irritated, however will consult psych. 5. Neuropsych: This patientiscapable of making decisions on hisown behalf. 6. Skin/Wound Care:Scrotal sling -local care to bullae, foam dressings 7. Fluids/Electrolytes/Nutrition:Routine in and outs 8. End-stage renal disease.  Status post AV fistula 01/09/2019. Hemodialysis directed. -continue to diurese with HD, recs per nephro Filed Weights   02/10/19 0458 02/11/19 1825 02/13/19 0500  Weight: (!) 139.4 kg 134 kg 134.2 kg   9. Diabetes mellitus. Recent hemoglobin A1c 5.0.   Elevated on 7/26,?  Secondary to dextrose  Continue to monitor 10. Anemia of chronic disease. Continue Aranesp  Hemoglobin 7.7 on 7/25, recs per nephro 11. History of left BKA 5 years ago in [place].   12. Hypertension. Norvasc 10 mg daily.    Controlled on 7/28 13. Morbid obesity. BMI 43.78. Dietary follow-up 14. Hyperlipidemia. Lipitor 15. History of syphilis. Treated outpatient with penicillin to the healthcare department recently. Outpatient follow-up 16. Urine retention:   Urology finally was able to pass 12 Fr coude cath   Foley to be left in place per urology, outpt follow up  UA equivocal, urine culture pending 17.  Sleep disturbance  Melatonin 4.5 mg nightly   18.  Leukocytosis  WBCs 14.0 on 7/25  Afebrile  Continue to monitor 19.  Constipation  Bowel meds increased on 7/28  >35 minutes spent with patient and family with >30 minutes in counseling and coordination of care regarding pain, cognition, and psychosocial issues  LOS: 6 days A FACE TO FACE EVALUATION WAS PERFORMED  Michael Riggs Michael Riggs Michael Riggs Michael Riggs 02/14/2019, 8:27 AM

## 2019-02-14 NOTE — Progress Notes (Signed)
Received a call from [place] at 03:30 am, reporting Michael Riggs complaining of constipation, She stated there was order for a fleets enema , but it was cancelled by Nephrology. Note was reviewed, Dr. Elmarie Shiley stated do not use sodium phosphate enema. This provider spoke with pharmacy and dulcolax suppository ordered. Michael Riggs verbalizes understanding.

## 2019-02-14 NOTE — Procedures (Signed)
Patient seen on Hemodialysis. BP 120/70 (BP Location: Left Arm)   Pulse 82   Temp 98.3 F (36.8 C) (Oral)   Resp 20   Ht 5\' 10"  (1.778 m)   Wt 134.2 kg   SpO2 94%   BMI 42.45 kg/m   QB 350, UF goal 6L Tolerating treatment without complaints at this time.   Elmarie Shiley MD [facility]. Office # [phone] Pager # [phone] 1:33 PM

## 2019-02-14 NOTE — Progress Notes (Signed)
Pt continued to c/o of buttock pain. This nurse tried to assess the area, pt was yelling in pain saying to stop when barely touching the left side of buttock. Pt states he feels like he has to have a bowel movement but it will not come out. Sorbitol was given to the pt. Will continue to monitor.

## 2019-02-14 NOTE — Consult Note (Signed)
Attempted to see patient but in dialysis until 5 per nurse. Will attempt to see patient again tomorrow.   Buford Dresser, DO 02/14/19 4:01 PM

## 2019-02-15 ENCOUNTER — Inpatient Hospital Stay (HOSPITAL_COMMUNITY): Payer: Medicare Other | Admitting: Physical Therapy

## 2019-02-15 ENCOUNTER — Encounter (HOSPITAL_COMMUNITY): Payer: Medicare Other | Admitting: Psychology

## 2019-02-15 ENCOUNTER — Inpatient Hospital Stay (HOSPITAL_COMMUNITY): Payer: Medicare Other

## 2019-02-15 DIAGNOSIS — Z89512 Acquired absence of left leg below knee: Secondary | ICD-10-CM

## 2019-02-15 NOTE — Progress Notes (Signed)
Physical Therapy Session Note  Patient Details  Name: Michael Riggs MRN: [id number] Date of Birth: 17-Mar-1971  Today's Date: 02/15/2019 PT Individual Time: 1300-1327 PT Individual Time Calculation (min): 27 min   Short Term Goals: Week 1:  PT Short Term Goal 1 (Week 1): Pt will be able to perform bed mobility with max assist PT Short Term Goal 2 (Week 1): Pt will be able to perform functional transfer bed <> w/c with max assist PT Short Term Goal 3 (Week 1): Pt will be able to perform w/c propulsion for functional mobility with supervision x 50'  Skilled Therapeutic Interventions/Progress Updates:   Pt in supine and agreeable to therapy, denies pain at rest. Pt much more engaging and willing to participate this sessions. Pt stated "I need to learn to get up and move around". Supine>sit w/ max assist and maintained static sitting balance for 30 sec w/ mod-max assist. Attempted to don prosthesis, however pt unable to tolerate 2/2 increased LLE swelling. Will contact prosthesis vendor to discuss adjusting size of prosthesis to allow for fluctuations in swelling. Pt then returning to supine impulsively 2/2 scrotal pain in sitting. Pt agreeable to re-attempt. Placed slide board prior to transferring to sitting. Slide board placed well using this technique, however pt unwilling to try once back sitting EOB. Pt fearful of falling and states "getting up to the side of the bed was enough for today". Impulsively returned to supine despite encouragement from this therapist. Min assist to boost up in bed using hospital bed features. Ended session in supine, all needs in reach.   Therapy Documentation Precautions:  Precautions Precautions: Fall, Other (comment) Precaution Comments: L BKA with prosthesis; significant scrotal edema Restrictions Weight Bearing Restrictions: No  Therapy/Group: Individual Therapy  Amandajo Gonder K Henning Ehle 02/15/2019, 1:28 PM

## 2019-02-15 NOTE — Progress Notes (Signed)
Pt continues to ask where his personal cell phone is. Pt states "the last time i seen it was when they put the foley in". This nurse looked in the room, did not find the cell phone. Dept Admin will be notified.

## 2019-02-15 NOTE — Progress Notes (Signed)
Occupational Therapy Session Note  Patient Details  Name: Kamau Weatherall MRN: [id number] Date of Birth: 08-22-1970  Today's Date: 02/15/2019 OT Individual Time: 1030-1048 OT Individual Time Calculation (min): 18 min    Short Term Goals: Week 1:  OT Short Term Goal 1 (Week 1): Pt will tolerate sitting EOB wiht MIN A for sitting balance for 10 min during functional activity OT Short Term Goal 2 (Week 1): Pt will transfer to [facility] with MOD A of 1 caregiver to decrease caregiver burden OT Short Term Goal 3 (Week 1): Pt will don sock with AE PRN and min VC for technique OT Short Term Goal 4 (Week 1): Pt will bathe with MIN A  Skilled Therapeutic Interventions/Progress Updates:    1:1. Pt received in bed reporting 10/10 pain in scrotum. Pt tangential in nature when asked how things were going in therapy. Pt agreeable to sit up on EOB with MAX A "only because its you [OT]." Pt maintains position EOB with MIN A and ignores cues for weight shifting to improve alignment/posterior bias for ~30 seconds  Before lying back down. Pt requst RN look at dressing on leg and refuses other tx missing 12 min. Exited session with pt seated in bed, exit alarm on and call light in reach  Therapy Documentation Precautions:  Precautions Precautions: Fall, Other (comment) Precaution Comments: L BKA with prosthesis; significant scrotal edema Restrictions Weight Bearing Restrictions: No General: General OT Amount of Missed Time: 12 Minutes Vital Signs:  Pain: Pain Assessment Pain Scale: 0-10 Pain Score: 6  Pain Type: Acute pain Pain Location: Leg Pain Orientation: Right Pain Descriptors / Indicators: Discomfort Pain Intervention(s): Repositioned ADL: ADL Eating: Independent Grooming: Setup Upper Body Bathing: Minimal assistance Where Assessed-Upper Body Bathing: Bed level Lower Body Bathing: Maximal assistance Where Assessed-Lower Body Bathing: Bed level Upper Body Dressing: Moderate  assistance Where Assessed-Upper Body Dressing: Bed level Toileting: Not assessed Toilet Transfer: Not assessed Vision   Perception    Praxis   Exercises:   Other Treatments:     Therapy/Group: Individual Therapy  Tonny Branch 02/15/2019, 10:50 AM

## 2019-02-15 NOTE — Consult Note (Signed)
Neuropsychological Consultation   Patient:   Mekhai Riggs   DOB:   July 19, 1971  MR Number:  [id number]  Location:  [facility] [address] CENTER [address] [place] [id number] [place] [id number] Dept: [place]: [phone]           Date of Service:   02/15/2019  Start Time:   9 AM End Time:   10:10 AM  Provider/Observer:  Ilean Skill, Psy.D.       Clinical Neuropsychologist       Billing Code/Service: Diagnostic Clinical Interview  Chief Complaint:    Michael Riggs is a 48 year old male with a history of syphilis, anemia of chronic disease, chronic kidney disease, diabetes, morbid obesity, hypertension and left BKA 5 years ago.  The patient had been independent prior to admission with prosthesis and wheelchair prior to admission.  The patient had a recent admission on 01/29/2021 02/03/2023 uremia with acute kidney injury receiving tunneled dialysis catheter 02/02/2019 and was to begin outpatient hemodialysis on Tuesday, Thursday and Saturdays.  The patient re-presented on 02/04/2019 with increased shortness of breath as well as scrotal edema.  Chest CT showed mild vascular congestion.  The patient had apparently fallen after the previous discharge and there were some skin tears to the right lower extremity.  The patient is dealing with debility and numerous medical complications.  Today, during visit the patient was asleep resting between therapy sessions.  He was showing signs of OSA and trouble moving air.  He did not have his CPAP on.  He was very hard to arouse and once awake it took time for him to become oriented and alert.  The patient did fully awake and was oriented once awake.  The patient had on prior day verbalized that he wished to kill self and was agitated and angry.  Prolonged discussion today with patient regarding issues of possible suicidal ideation, possible plans or intent.  Patient reports continued  pain and frustration with everything he is dealing with.  Struggling with BKA, CKD, and other medical issues along with new issues related to overall debility.    Reason for Service:  Patient was referred for neuropsychological consultation due to coping and adjustment issuses and recent expression of SI/possible intent.  Below is the HPI for the current admission.  [name] is a 48 year old right-handed male history of syphilis, anemia of chronic disease, chronic kidney disease with recent dialysis initiated, diabetes mellitus, morbid obesity with BMI of 43.78, hypertension and left BKA 5 years ago in [facility]. Per chart review patient lives with his elderly mother. Reportedly independent with prosthesis and wheelchair prior to admission. 1 level home. Mother is limited physically and receives CAPSassistance for herself 5 hours a day. He has a brother that works nights. Recent admission 01/30/2019 to 02/03/2019 for uremia with acute kidney injury receiving tunneled dialysis catheter 02/02/2019 and was to begin outpatient hemodialysis at [facility] clinic Tuesday Thursday Saturday. Patient presented 02/04/2019 with increasing shortness of breath as well as scrotal edema. Chest x-ray showed mild vascular congestion. There was noted skin tears of the right lower extremity patient had noted fall since recent discharge home. Hemoglobin 7.9, creatinine 8.82, calcium 6.3, COVID negative. Hemodialysis again follow-up and did initially receive an extra treatment of hemodialysis for suspect volume overload.WOCfollow-up for wounds to right lower leg with dressing changes as directed. Subcutaneous heparin for DVT prophylaxis. Therapy evaluations completed and patient was admitted for a comprehensive rehab program.  Current Status:  Once the patient fully awoke, it was well oriented but remained reclined in bed.  He was hard to awake and orient initially.  Breathing was  labored and visible signs of difficulty moving air.  Did not have CPAP on but was in his room.  Patient very clearly stated that he did not have intent, goal or plans to harm self.  Clearly stating desire to live and get back to his family.  Stated that he was in a lot of pain prior day and while pain continues he is coping a little better with pain.  Stated that the prior days statement was more of expression of agitation, anger, pain, lack of sleep from long medical procedure stating at 3 AM that morning and lasting for 3 hours.  Patient is not suicidal or an immenant risk to self or others.     Behavioral Observation: Michael Riggs  presents as a 48 y.o.-year-old Right Hispanic Male who appeared his stated age. his dress was Appropriate, inappropriate and he was sparsely dressed and his manners were Appropriate to the situation.  his participation was indicative of Appropriate and Redirectable behaviors.  There were any physical disabilities noted.  he displayed an appropriate level of cooperation and motivation.     Interactions:    Active Appropriate and Redirectable  Attention:   abnormal and attention span appeared shorter than expected for age  Memory:   within normal limits; recent and remote memory intact  Visuo-spatial:  not examined  Speech (Volume):  normal  Speech:   normal; normal  Thought Process:  Coherent and Circumstantial  Though Content:  Rumination and hyper focus on pain and was congruent with situation; not suicidal and not homicidal  Orientation:   person, place, time/date and situation  Judgment:   Fair  Planning:   Poor  Affect:    Lethargic initially with congruent mood once awake.  Mood:    Dysphoric  Insight:   Fair  Medical History:   Past Medical History:  Diagnosis Date  . Anemia   . Chronic kidney disease    Stage 5  . Depression   . Diabetes mellitus without complication ([place])   . Dyspnea   . GERD (gastroesophageal reflux disease)    . Hypertension        Psychiatric History:  Patient does have history of depression.  Family Med/Psych History: History reviewed. No pertinent family history.  Risk of Suicide/Violence: low Patient denies SI or HI but has stated wanted to kill self yesterday but reports that was due to his acute frustration, pain, and being up from 3 AM to 6 AM having painful procedure.    Impression/DX:  Michael Riggs is a 48 year old male with a history of syphilis, anemia of chronic disease, chronic kidney disease, diabetes, morbid obesity, hypertension and left BKA 5 years ago.  The patient had been independent prior to admission with prosthesis and wheelchair prior to admission.  The patient had a recent admission on 01/29/2021 02/03/2023 uremia with acute kidney injury receiving tunneled dialysis catheter 02/02/2019 and was to begin outpatient hemodialysis on Tuesday, Thursday and Saturdays.  The patient re-presented on 02/04/2019 with increased shortness of breath as well as scrotal edema.  Chest CT showed mild vascular congestion.  The patient had apparently fallen after the previous discharge and there were some skin tears to the right lower extremity.  The patient is dealing with debility and numerous medical complications.  Today, during visit the patient was asleep  resting between therapy sessions.  He was showing signs of OSA and trouble moving air.  He did not have his CPAP on.  He was very hard to arouse and once awake it took time for him to become oriented and alert.  The patient did fully awake and was oriented once awake.  The patient had on prior day verbalized that he wished to kill self and was agitated and angry.  Prolonged discussion today with patient regarding issues of possible suicidal ideation, possible plans or intent.  Patient reports continued pain and frustration with everything he is dealing with.  Struggling with BKA, CKD, and other medical issues along with new issues related to overall  debility.    Once the patient fully awoke, it was well oriented but remained reclined in bed.  He was hard to awake and orient initially.  Breathing was labored and visible signs of difficulty moving air.  Did not have CPAP on but was in his room.  Patient very clearly stated that he did not have intent, goal or plans to harm self.  Clearly stating desire to live and get back to his family.  Stated that he was in a lot of pain prior day and while pain continues he is coping a little better with pain.  Stated that the prior days statement was more of expression of agitation, anger, pain, lack of sleep from long medical procedure stating at 3 AM that morning and lasting for 3 hours.  Patient is not suicidal or an immenant risk to self or others.    Disposition/Plan:  Will follow-up with patient if needed.           Electronically Signed   _______________________ Ilean Skill, Psy.D.

## 2019-02-15 NOTE — Progress Notes (Signed)
[place] PHYSICAL MEDICINE & REHABILITATION PROGRESS NOTE   Subjective/Complaints: P patient seen laying in bed this morning.  He states he slept well overnight, he is still sleepy this morning.  He denies complaints however.  Several bowel movements noted yesterday.  He had HD yesterday.  He was seen by urology yesterday, discussed plans for foley. Discussed with Psych as well, plan to see patient today.   ROS: Denies CP, SOB, N/V/D  Objective:   No results found. Recent Labs    02/14/19 1946  WBC 13.8*  HGB 7.9*  HCT 25.6*  PLT 283   Recent Labs    02/14/19 1946  NA 132*  K 3.8  CL 96*  CO2 27  GLUCOSE 120*  BUN 22*  CREATININE 4.66*  CALCIUM 7.9*    Intake/Output Summary (Last 24 hours) at 02/15/2019 0825 Last data filed at 02/15/2019 0500 Gross per 24 hour  Intake 440 ml  Output 6275 ml  Net -5835 ml     Physical Exam: Vital Signs Blood pressure (!) 141/91, pulse 82, temperature 98.1 F (36.7 C), temperature source Oral, resp. rate 18, height 5\' 10"  (1.778 m), weight 131 kg, SpO2 92 %. Constitutional: No distress . Vital signs reviewed.  Morbidly obese. HENT: Normocephalic.  Atraumatic. Eyes: EOMI.  No discharge. Cardiovascular: No JVD. Respiratory: Normal effort. GI: Non-distended. GU: Foley in place Musc: Bilateral lower extremity and scrotal edema Neurological: Alert and oriented Motor: Bilateral upper extremities 4/5 (limited by participation).  RLE: 4/5 proximal to distal Left lower extremity: Hip flexion, knee extension 4+/5 Skin: Skin iswarm. He isnot diaphoretic. Left BKA healed.  RLE wound with dressing C/D/I  Psychiatric: Normal mood.  Normal affect.  Assessment/Plan: 1. Functional deficits secondary to debility which require 3+ hours per day of interdisciplinary therapy in a comprehensive inpatient rehab setting.  Physiatrist is providing close team supervision and 24 hour management of active medical problems listed  below.  Physiatrist and rehab team continue to assess barriers to discharge/monitor patient progress toward functional and medical goals  Care Tool:  Bathing    Body parts bathed by patient: Right arm, Left arm, Chest, Abdomen, Face   Body parts bathed by helper: Front perineal area, Buttocks, Right upper leg, Left upper leg, Right lower leg Body parts n/a: Left lower leg   Bathing assist Assist Level: Maximal Assistance - Patient 24 - 49%     Upper Body Dressing/Undressing Upper body dressing Upper body dressing/undressing activity did not occur (including orthotics): N/A(patient already had a gown) What is the patient wearing?: Hospital gown only    Upper body assist Assist Level: Total Assistance - Patient < 25%    Lower Body Dressing/Undressing Lower body dressing    Lower body dressing activity did not occur: Refused What is the patient wearing?: Hospital gown only     Lower body assist Assist for lower body dressing: Total Assistance - Patient < 25%     Toileting Toileting Toileting Activity did not occur Landscape architect and hygiene only): Safety/medical concerns  Toileting assist Assist for toileting: Dependent - Patient 0%     Transfers Chair/bed transfer  Transfers assist  Chair/bed transfer activity did not occur: Safety/medical concerns  Chair/bed transfer assist level: 2 Helpers(3+maxi )     Locomotion Ambulation   Ambulation assist   Ambulation activity did not occur: Safety/medical concerns(unable due to pain and scrotal and bladder issues)          Walk 10 feet activity   Assist  Walk  10 feet activity did not occur: Safety/medical concerns(unable due to pain and scrotal and bladder issues)        Walk 50 feet activity   Assist Walk 50 feet with 2 turns activity did not occur: Safety/medical concerns(unable due to pain and scrotal and bladder issues)         Walk 150 feet activity   Assist Walk 150 feet activity did  not occur: Safety/medical concerns(unable due to pain and scrotal and bladder issues)         Walk 10 feet on uneven surface  activity   Assist Walk 10 feet on uneven surfaces activity did not occur: Safety/medical concerns(unable due to pain and scrotal and bladder issues)         Wheelchair     Assist     Wheelchair activity did not occur: Safety/medical concerns(unable due to pain and scrotal and bladder issues)         Wheelchair 50 feet with 2 turns activity    Assist    Wheelchair 50 feet with 2 turns activity did not occur: Safety/medical concerns(unable due to pain and scrotal and bladder issues)       Wheelchair 150 feet activity     Assist Wheelchair 150 feet activity did not occur: Safety/medical concerns(unable due to pain and scrotal and bladder issues)        Medical Problem List and Plan: 1.Debilitysecondary to end-stage renal disease/multi-medical. Hemodialysis recently initiated.  Continue CIR  Team conference today to discuss current and goals and coordination of care, home and environmental barriers, and discharge planning with nursing, case manager, and therapies.  2. Antithrombotics: -DVT/anticoagulation:Subcutaneous heparin -antiplatelet therapy: N/A 3. Pain Management:Neurontin 300 mg nightly, Ultram as needed  Stable with medications the same. 4. Mood:Celexa 40 mg daily -antipsychotic agents: N/A  Does not appear to have true suicidal ideation without a plan and more agitated and irritated, however will consult psych, discussed with Psych, plan to see pt today. 5. Neuropsych: This patientiscapable of making decisions on hisown behalf. 6. Skin/Wound Care:Scrotal sling -local care to bullae, foam dressings 7. Fluids/Electrolytes/Nutrition:Routine in and outs 8. End-stage renal disease. Status post AV fistula 01/09/2019. Hemodialysis directed. -continue to diurese  with HD, recs per nephro Filed Weights   02/14/19 1315 02/14/19 1757 02/15/19 0311  Weight: (!) 137.7 kg 131.2 kg 131 kg   9. Diabetes mellitus. Recent hemoglobin A1c 5.0.   Labile on 7/29  Continue to monitor 10. Anemia of chronic disease. Continue Aranesp  Hemoglobin 7.9 on 7/28, recs per nephro 11. History of left BKA 5 years ago in [place].   12. Hypertension. Norvasc 10 mg daily.    Labile on 7/29 13. Morbid obesity. BMI 43.78. Dietary follow-up 14. Hyperlipidemia. Lipitor 15. History of syphilis. Treated outpatient with penicillin to the healthcare department recently. Outpatient follow-up 16. Urine retention:   Urology finally was able to pass 12 Fr coude cath   Foley to be left in place per urology, discussed with urology, plan for voiding trial on Monday if patient more mobile and less edematous  UA equivocal, urine culture with insignificant growth 17.  Sleep disturbance  Melatonin 4.5 mg nightly   18.  Leukocytosis  WBCs 13.8 on 7/28  Afebrile  Continue to monitor 19.  Constipation  Bowel meds increased on 7/28  Improving  LOS: 7 days A FACE TO FACE EVALUATION WAS PERFORMED  Michael Riggs Lorie Phenix 02/15/2019, 8:25 AM

## 2019-02-15 NOTE — Progress Notes (Signed)
[name], male   DOB: Dec 03, 1970, 48 y.o.   MRN: [id number]  [facility] Progress Note   Assessment/ Plan:   1.  Physical debilitation/deconditioning following hospitalization resulting in initiation of hemodialysis: Currently ongoing inpatient rehabilitation, appears to be gradually making progress with improved participation.  Neuropsychology note reviewed. 2. ESRD: New start to hemodialysis, continue dialysis on a TTS schedule via right IJ TDC as his left first stage BBF matures (created 01/09/2019 by Dr. Donnetta Hutching).  Continue hemodialysis on a Tuesday/Thursday/Saturday schedule with his next dialysis tomorrow with efforts to lower dry weight with ultrafiltration.  Plans noted for removal of Foley catheter on 02/20/2019. 3. Anemia: Hemoglobin/hematocrit improved status post PRBC, iron stores replete, will re-dose ESA today. 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  Do not use phosphate/magnesium based laxatives or enemas. 5. Nutrition: Continue renal diet with renal multivitamin. 6.  Chronic pain: Management per PM&R service.  Subjective:   Reports to have slept better last night but still feeling tired this morning.  Seen earlier by neuropsychology.   Objective:   BP (!) 141/91 (BP Location: Right Arm)   Pulse 82   Temp 98.1 F (36.7 C) (Oral)   Resp 18   Ht 5\' 10"  (1.778 m)   Wt 131 kg   SpO2 92%   BMI 41.44 kg/m   Physical Exam: Gen: Comfortably resting in bed (seemingly talking to himself), awakens to voice CVS: Pulse regular rhythm, normal rate, S1 and S2 normal Resp: Anteriorly clear to auscultation, no rales/rhonchi Abd: Soft, obese, nontender Ext: 2+ edema over lower back/pretibial area.  Left BBT with audible bruit  Labs: BMET Recent Labs  Lab 02/09/19 1500 02/11/19 1523 02/14/19 1946  NA  --  129* 132*  K  --  3.9 3.8  CL  --  94* 96*  CO2  --  25 27  GLUCOSE  --  168*  120*  BUN  --  32* 22*  CREATININE 6.42* 5.88* 4.66*  CALCIUM  --  7.6* 7.9*  PHOS  --  3.6 2.4*   CBC Recent Labs  Lab 02/09/19 1500 02/11/19 1523 02/14/19 1946  WBC 15.8* 14.0* 13.8*  NEUTROABS 11.7*  --   --   HGB 6.6* 7.7* 7.9*  HCT 21.8* 25.4* 25.6*  MCV 100.0 95.8 93.8  PLT 188 220 283   Medications:    . sodium chloride   Intravenous Once  . amLODipine  10 mg Oral Daily  . atorvastatin  40 mg Oral Daily  . calcitRIOL  0.25 mcg Oral Daily  . calcium carbonate  1 tablet Oral BID WC  . citalopram  40 mg Oral Daily  . collagenase   Topical Daily  . darbepoetin (ARANESP) injection - DIALYSIS  150 mcg Intravenous Q Tue-HD  . gabapentin  300 mg Oral QHS  . heparin  5,000 Units Subcutaneous Q8H  . Melatonin  4.5 mg Oral QHS  . pantoprazole  40 mg Oral Daily  . polyethylene glycol  17 g Oral BID  . senna-docusate  2 tablet Oral BID   Elmarie Shiley, MD 02/15/2019, 12:06 PM

## 2019-02-16 ENCOUNTER — Inpatient Hospital Stay (HOSPITAL_COMMUNITY): Payer: Medicare Other | Admitting: Occupational Therapy

## 2019-02-16 ENCOUNTER — Inpatient Hospital Stay (HOSPITAL_COMMUNITY): Payer: Medicare Other | Admitting: Physical Therapy

## 2019-02-16 DIAGNOSIS — R0989 Other specified symptoms and signs involving the circulatory and respiratory systems: Secondary | ICD-10-CM

## 2019-02-16 DIAGNOSIS — N5089 Other specified disorders of the male genital organs: Secondary | ICD-10-CM

## 2019-02-16 LAB — RENAL FUNCTION PANEL
Albumin: 1.6 g/dL — ABNORMAL LOW (ref 3.5–5.0)
Anion gap: 12 (ref 5–15)
BUN: 41 mg/dL — ABNORMAL HIGH (ref 6–20)
CO2: 23 mmol/L (ref 22–32)
Calcium: 7.7 mg/dL — ABNORMAL LOW (ref 8.9–10.3)
Chloride: 91 mmol/L — ABNORMAL LOW (ref 98–111)
Creatinine, Ser: 6.76 mg/dL — ABNORMAL HIGH (ref 0.61–1.24)
GFR calc Af Amer: 10 mL/min — ABNORMAL LOW (ref >60)
GFR calc non Af Amer: 9 mL/min — ABNORMAL LOW (ref >60)
Glucose, Bld: 143 mg/dL — ABNORMAL HIGH (ref 70–99)
Phosphorus: 3.1 mg/dL (ref 2.5–4.6)
Potassium: 4 mmol/L (ref 3.5–5.1)
Sodium: 126 mmol/L — ABNORMAL LOW (ref 135–145)

## 2019-02-16 LAB — CBC
HCT: 26.1 % — ABNORMAL LOW (ref 39.0–52.0)
Hemoglobin: 7.9 g/dL — ABNORMAL LOW (ref 13.0–17.0)
MCH: 29 pg (ref 26.0–34.0)
MCHC: 30.3 g/dL (ref 30.0–36.0)
MCV: 96 fL (ref 80.0–100.0)
Platelets: 369 10*3/uL (ref 150–400)
RBC: 2.72 MIL/uL — ABNORMAL LOW (ref 4.22–5.81)
RDW: 13.2 % (ref 11.5–15.5)
WBC: 12.4 10*3/uL — ABNORMAL HIGH (ref 4.0–10.5)
nRBC: 0 % (ref 0.0–0.2)

## 2019-02-16 MED ORDER — AMLODIPINE BESYLATE 5 MG PO TABS
5.0000 mg | ORAL_TABLET | Freq: Every day | ORAL | Status: DC
  Administered 2019-02-17 – 2019-02-20 (×4): 5 mg via ORAL
  Filled 2019-02-16 (×4): qty 1

## 2019-02-16 MED ORDER — ALBUMIN HUMAN 25 % IV SOLN
25.0000 g | Freq: Once | INTRAVENOUS | Status: AC
  Administered 2019-02-16: 14:00:00 25 g via INTRAVENOUS

## 2019-02-16 MED ORDER — ALBUMIN HUMAN 25 % IV SOLN
INTRAVENOUS | Status: AC
  Administered 2019-02-16: 25 g via INTRAVENOUS
  Filled 2019-02-16: qty 100

## 2019-02-16 MED ORDER — HEPARIN SODIUM (PORCINE) 1000 UNIT/ML DIALYSIS: 40.0000 [IU]/kg | INTRAMUSCULAR | Status: DC | PRN

## 2019-02-16 MED ORDER — HEPARIN SODIUM (PORCINE) 1000 UNIT/ML IJ SOLN
INTRAMUSCULAR | Status: AC
  Filled 2019-02-16: qty 4

## 2019-02-16 NOTE — Progress Notes (Signed)
[name], male   DOB: 10-27-1970, 48 y.o.   MRN: [id number]  [facility] Progress Note   Assessment/ Plan:   1.  Physical debilitation/deconditioning following hospitalization resulting in initiation of hemodialysis: Currently ongoing inpatient rehabilitation, appearing to make progress with restful sleep overnight and improving mentation/mood. 2. ESRD: New start to hemodialysis, continue dialysis on a TTS schedule via right IJ TDC as his left first stage BBF matures (created 01/09/2019 by Dr. Donnetta Hutching).  He is on schedule for hemodialysis later today.  Plans noted for removal of Foley catheter on 02/20/2019. 3. Anemia: Hemoglobin/hematocrit appear stable status post PRBC without overt loss, iron stores replete, titrate ESA. 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  Do not use phosphate/magnesium based laxatives or enemas. 5. Nutrition: Continue renal diet with renal multivitamin. 6.  Chronic pain: Management per PM&R service.  Subjective:   Reports feeling better today with restful sleep overnight on CPAP, some scrotal discomfort secondary to edema.   Objective:   BP 138/87 (BP Location: Right Arm)   Pulse 75   Temp 98.7 F (37.1 C) (Axillary)   Resp 18   Ht 5\' 10"  (1.778 m)   Wt 101.1 kg Comment: possible incorrect reading on previous day.   SpO2 96%   BMI 31.98 kg/m   Physical Exam: Gen: Sitting up in wheelchair, getting ready for therapy CVS: Pulse regular rhythm, normal rate, S1 and S2 normal Resp: Anteriorly clear to auscultation, no rales/rhonchi Abd: Soft, obese, nontender Ext: 1-2+ edema of thigh/lower back/scrotum.  Status post left BKA.  Left BBT with audible bruit  Labs: BMET Recent Labs  Lab 02/09/19 1500 02/11/19 1523 02/14/19 1946  NA  --  129* 132*  K  --  3.9 3.8  CL  --  94* 96*  CO2  --  25 27  GLUCOSE  --  168* 120*  BUN  --  32* 22*  CREATININE 6.42* 5.88*  4.66*  CALCIUM  --  7.6* 7.9*  PHOS  --  3.6 2.4*   CBC Recent Labs  Lab 02/09/19 1500 02/11/19 1523 02/14/19 1946  WBC 15.8* 14.0* 13.8*  NEUTROABS 11.7*  --   --   HGB 6.6* 7.7* 7.9*  HCT 21.8* 25.4* 25.6*  MCV 100.0 95.8 93.8  PLT 188 220 283   Medications:    . sodium chloride   Intravenous Once  . amLODipine  10 mg Oral Daily  . atorvastatin  40 mg Oral Daily  . calcitRIOL  0.25 mcg Oral Daily  . calcium carbonate  1 tablet Oral BID WC  . citalopram  40 mg Oral Daily  . collagenase   Topical Daily  . darbepoetin (ARANESP) injection - DIALYSIS  150 mcg Intravenous Q Tue-HD  . gabapentin  300 mg Oral QHS  . heparin  5,000 Units Subcutaneous Q8H  . Melatonin  4.5 mg Oral QHS  . pantoprazole  40 mg Oral Daily  . polyethylene glycol  17 g Oral BID  . senna-docusate  2 tablet Oral BID   Elmarie Shiley, MD 02/16/2019, 10:35 AM

## 2019-02-16 NOTE — Procedures (Signed)
Patient was seen on dialysis and the procedure was supervised.  BFR 400  Via TDC BP is  030 systolic.   Patient immediately dropped his BP on machine with nausea and diarrhea, appears much uf last several  Treatments- will give albumin and dec goal from 4000 to 3000- also will dec amlodipine at night to 5 mg as still has dep pitting edema  Michael Riggs 02/16/2019'

## 2019-02-16 NOTE — Plan of Care (Signed)
  Problem: RH Balance Goal: LTG Patient will maintain dynamic sitting balance (PT) Description: LTG:  Patient will maintain dynamic sitting balance with assistance during mobility activities (PT) Flowsheets (Taken 02/16/2019 1240) LTG: Pt will maintain dynamic sitting balance during mobility activities with:: (goal downgraded 7/30 due to lack of progress - AAT) Supervision/Verbal cueing Note: Goal downgraded 7/30 due to lack of progress - AAT Goal: LTG Patient will maintain dynamic standing balance (PT) Description: LTG:  Patient will maintain dynamic standing balance with assistance during mobility activities (PT) Outcome: Not Applicable Flowsheets (Taken 02/16/2019 1240) LTG: Pt will maintain dynamic standing balance during mobility activities with:: (Goal discontinued 7/30 due to lack of progress - AAT) -- Note: Goal discontinued 7/30 due to lack of progress - AAT   Problem: RH Balance Goal: LTG Patient will maintain dynamic standing balance (PT) Description: LTG:  Patient will maintain dynamic standing balance with assistance during mobility activities (PT) Outcome: Not Applicable Flowsheets (Taken 02/16/2019 1240) LTG: Pt will maintain dynamic standing balance during mobility activities with:: (Goal discontinued 7/30 due to lack of progress - AAT) -- Note: Goal discontinued 7/30 due to lack of progress - AAT   Problem: Sit to Stand Goal: LTG:  Patient will perform sit to stand with assistance level (PT) Description: LTG:  Patient will perform sit to stand with assistance level (PT) Flowsheets (Taken 02/16/2019 1240) LTG: PT will perform sit to stand in preparation for functional mobility with assistance level: (Goal downgraded 7/30 due to lack of progress - AAT) Minimal Assistance - Patient > 75% Note: Goal downgraded 7/30 due to lack of progress - AAT

## 2019-02-16 NOTE — Progress Notes (Addendum)
Report given to [place], RN in HD. Pt transported to HD via bed, side rails x 4. Will f/up when pt returns back to unit.   Erie Noe, RN

## 2019-02-16 NOTE — Patient Care Conference (Signed)
Inpatient RehabilitationTeam Conference and Plan of Care Update Date: 02/15/2019   Time: 2:45 PM    Patient Name: Michael Riggs      Medical Record Number: [id number]  Date of Birth: August 09, 1970 Sex: Male         Room/Bed: HD04C/HD04C Payor Info: Payor: Marine scientist / Plan: [employer] MEDICARE / Product Type: *No Product type* /    Admitting Diagnosis: 6. ABI Team  Debility, malaise; 17-19days  Admit Date/Time:  02/08/2019 11:54 PM Admission Comments: No comment available   Primary Diagnosis:  <principal problem not specified> Principal Problem: <principal problem not specified>  Patient Active Problem List   Diagnosis Date Noted  . Labile blood pressure   . Scrotal edema   . Status post below-knee amputation of left lower extremity ([place])   . Slow transit constipation   . Leukocytosis   . Sleep disturbance   . Essential hypertension   . Anemia of chronic disease   . Controlled type 2 diabetes mellitus with hyperglycemia, without long-term current use of insulin ([place])   . ESRD on dialysis ([place])   . Urinary retention   . Debility 02/09/2019  . Fluid overload 02/04/2019  . Hyperkalemia 01/30/2019  . ARF (acute renal failure) ([place]) 01/30/2019  . Hypokalemia 01/30/2019  . Hypoglycemia 01/30/2019  . Syphilis 01/30/2019  . Macrocytic anemia 01/30/2019  . End stage renal disease ([place])   . PDR (proliferative diabetic retinopathy) ([place]) 12/14/2013  . Venous hypertension 09/22/2013  . Diabetes mellitus type 2 in obese ([place]) 07/04/2013  . Habitual alcohol use 07/04/2013  . Obesity, Class II, BMI 35-39.9, with comorbidity 07/04/2013    Expected Discharge Date: Expected Discharge Date: (TBD)  Team Members Present: Physician leading conference: Dr. Delice Lesch Social Worker Present: Lennart Pall, LCSW Nurse Present: Rosita Fire, RN PT Present: Burnard Bunting, PT OT Present: Mariane Masters, OT SLP Present: Weston Anna, SLP PPS Coordinator present : Gunnar Fusi,  SLP     Current Status/Progress Goal Weekly Team [place] secondary to end-stage renal disease/multi-medical.  Hemodialysis recently initiated.  Improve mobility, pain, cognition, lethargy, DM, HTN, leukocytosis, constipation, urinary retention, ESRD  See above   Bowel/Bladder   Pt is cont/incont of bowel, LBm 02/13/19, Pt has foley cath in with little output.  Pt will be continent of B/B, with no pain during elimination.  Timed toileting, proper foley care   Swallow/Nutrition/ Hydration             ADL's   Max A overall- refuses to participate most of the time 2/2 pain and discomfort  Supervision- may have to downgrade pending pt participation  participation, bed mobility, transfers, self-care retraining, scrotal edema management, dc planning   Mobility   max-total assist bed mobility, maximove transfers, pt continues to refuse most OOB activity 2/2 pain in scrotum and bottom  supervision-mod/i, w/c level  OOB tolerance, edema management, endurance, all functional mobility, global strengthening   Communication             Safety/Cognition/ Behavioral Observations            Pain   Pt c/o of 8/10 pain on multiple areas (buttocks, scrotum, back, R leg)  Pt will have pain level <3  Assess pain Qshift/PRN, address needs as needed with medication and repositioning.   Skin   Pt has blisters/dry skin/ulcer like areas on RLE. Daily dressing change with santyl cream. Pts sacrum has small discoloration.  Prevent further skin breakdown, and prevent infection  on the open areas.  Assess skin Qshift/ PRN, dressing changes as ordered.    Rehab Goals Patient on target to meet rehab goals: No Rehab Goals Revised: limited participation so far *See Care Plan and progress notes for long and short-term goals.     Barriers to Discharge  Current Status/Progress Possible Resolutions Date Resolved   Physician    Medical stability;Weight;Other (comments)  Hx of BKA, cognitive issues   See above  Therapies, optimize pain, bowel meds      Nursing                  PT                    OT                  SLP                SW                Discharge Planning/Teaching Needs:  Pt to return home his mother who cannot assist.  Per pt, brother and sister-in-law to assist but still need to confirm LOA they can provide.  Teaching needs TBD.   Team Discussion:  Behavioral/ mental health issues over the past few days - neuropsychology consulted.  Better today/ stable.  Plan for foley to remain with possible removal by urology on Monday.  Encourage CPAP hs and when napping.  Poor initiation to lear dsg changes.  Poor participation;  EOB with max assist;  Poor tolerance for transfers.  Decreased schedule to qd.  Doubtful if will reach supervision goals.  Refusing to use scrotal sling.  SW to confirm LOC available at home.  Revisions to Treatment Plan:  NA - possible downgrading of goals.    Continued Need for Acute Rehabilitation Level of Care: The patient requires daily medical management by a physician with specialized training in physical medicine and rehabilitation for the following conditions: Daily direction of a multidisciplinary physical rehabilitation program to ensure safe treatment while eliciting the highest outcome that is of practical value to the patient.: Yes Daily medical management of patient stability for increased activity during participation in an intensive rehabilitation regime.: Yes Daily analysis of laboratory values and/or radiology reports with any subsequent need for medication adjustment of medical intervention for : Renal problems;Urological problems;Diabetes problems;Blood pressure problems;Mood/behavior problems   I attest that I was present, lead the team conference, and concur with the assessment and plan of the team.   Lennart Pall 02/16/2019, 2:18 PM    Team conference was held via web/ teleconference due to [place] - 19

## 2019-02-16 NOTE — Progress Notes (Signed)
[place] PHYSICAL MEDICINE & REHABILITATION PROGRESS NOTE   Subjective/Complaints: Patient seen laying in bed this morning.  He states he slept well overnight.  He states he wore his CPAP.  He is much more alert and comfortable appearing this morning.  Educated patient on compliance with scrotal sling.  He states he had a large bowel movement overnight and feels better, noting that, "it was the size of a baby".  He was seen by nephrology yesterday, notes reviewed.  ROS: + Scrotal edema.  Denies CP, SOB, N/V/D  Objective:   No results found. Recent Labs    02/14/19 1946  WBC 13.8*  HGB 7.9*  HCT 25.6*  PLT 283   Recent Labs    02/14/19 1946  NA 132*  K 3.8  CL 96*  CO2 27  GLUCOSE 120*  BUN 22*  CREATININE 4.66*  CALCIUM 7.9*    Intake/Output Summary (Last 24 hours) at 02/16/2019 0915 Last data filed at 02/16/2019 0512 Gross per 24 hour  Intake 720 ml  Output 250 ml  Net 470 ml     Physical Exam: Vital Signs Blood pressure 138/87, pulse 75, temperature 98.7 F (37.1 C), temperature source Axillary, resp. rate 18, height 5\' 10"  (1.778 m), weight 101.1 kg, SpO2 96 %. Constitutional: No distress . Vital signs reviewed.  Morbidly obese. HENT: Normocephalic.  Atraumatic. Eyes: EOMI.  No discharge. Cardiovascular: No JVD. Respiratory: Normal effort. GI: Non-distended. GU: Foley in place Musc: Bilateral lower extremity and scrotal edema, stable Neurological: Alert and oriented Motor: Bilateral upper extremities 4-4+/5  RLE: 4-4+/5 proximal to distal Left lower extremity: Hip flexion, knee extension 4+/5 Skin: Skin iswarm. He isnot diaphoretic. Left BKA healed.  RLE wound with dressing C/D/I  Psychiatric: Normal mood.  Normal affect.  Assessment/Plan: 1. Functional deficits secondary to debility which require 3+ hours per day of interdisciplinary therapy in a comprehensive inpatient rehab setting.  Physiatrist is providing close team supervision and 24 hour  management of active medical problems listed below.  Physiatrist and rehab team continue to assess barriers to discharge/monitor patient progress toward functional and medical goals  Care Tool:  Bathing    Body parts bathed by patient: Right arm, Left arm, Chest, Abdomen, Face   Body parts bathed by helper: Front perineal area, Buttocks, Right upper leg, Left upper leg, Right lower leg Body parts n/a: Left lower leg   Bathing assist Assist Level: Maximal Assistance - Patient 24 - 49%     Upper Body Dressing/Undressing Upper body dressing Upper body dressing/undressing activity did not occur (including orthotics): N/A(patient already had a gown) What is the patient wearing?: Hospital gown only    Upper body assist Assist Level: Total Assistance - Patient < 25%    Lower Body Dressing/Undressing Lower body dressing    Lower body dressing activity did not occur: Refused What is the patient wearing?: Hospital gown only     Lower body assist Assist for lower body dressing: Total Assistance - Patient < 25%     Toileting Toileting Toileting Activity did not occur Landscape architect and hygiene only): Safety/medical concerns  Toileting assist Assist for toileting: Dependent - Patient 0%     Transfers Chair/bed transfer  Transfers assist  Chair/bed transfer activity did not occur: Safety/medical concerns  Chair/bed transfer assist level: 2 Helpers(3+maxi )     Locomotion Ambulation   Ambulation assist   Ambulation activity did not occur: Safety/medical concerns(unable due to pain and scrotal and bladder issues)  Walk 10 feet activity   Assist  Walk 10 feet activity did not occur: Safety/medical concerns(unable due to pain and scrotal and bladder issues)        Walk 50 feet activity   Assist Walk 50 feet with 2 turns activity did not occur: Safety/medical concerns(unable due to pain and scrotal and bladder issues)         Walk 150 feet  activity   Assist Walk 150 feet activity did not occur: Safety/medical concerns(unable due to pain and scrotal and bladder issues)         Walk 10 feet on uneven surface  activity   Assist Walk 10 feet on uneven surfaces activity did not occur: Safety/medical concerns(unable due to pain and scrotal and bladder issues)         Wheelchair     Assist     Wheelchair activity did not occur: Safety/medical concerns(unable due to pain and scrotal and bladder issues)         Wheelchair 50 feet with 2 turns activity    Assist    Wheelchair 50 feet with 2 turns activity did not occur: Safety/medical concerns(unable due to pain and scrotal and bladder issues)       Wheelchair 150 feet activity     Assist Wheelchair 150 feet activity did not occur: Safety/medical concerns(unable due to pain and scrotal and bladder issues)        Medical Problem List and Plan: 1.Debilitysecondary to end-stage renal disease/multi-medical. Hemodialysis recently initiated.  Continue CIR 2. Antithrombotics: -DVT/anticoagulation:Subcutaneous heparin -antiplatelet therapy: N/A 3. Pain Management:Neurontin 300 mg nightly, Ultram as needed  Stable with medications the same. 4. Mood:Celexa 40 mg daily -antipsychotic agents: N/A  Does not appear to have true suicidal ideation without a plan and more agitated and irritated, however will consult psych, discussed with Psych, plan to see pt today. 5. Neuropsych: This patientiscapable of making decisions on hisown behalf.  Discussed with neuropsychology-appreciate recs, no SI 6. Skin/Wound Care:Scrotal sling -local care to bullae, foam dressings 7. Fluids/Electrolytes/Nutrition:Routine in and outs 8. End-stage renal disease. Status post AV fistula 01/09/2019. Hemodialysis directed. -continue to diurese with HD, recs per nephro Filed Weights   02/14/19 1757 02/15/19 0311  02/16/19 0500  Weight: 131.2 kg 131 kg 101.1 kg   9. Diabetes mellitus. Recent hemoglobin A1c 5.0.   Elevated on 7/29  Continue to monitor 10. Anemia of chronic disease. Continue Aranesp  Hemoglobin 7.9 on 7/28, recs per nephro, labs with HD 11. History of left BKA 5 years ago in [place].   12. Hypertension. Norvasc 10 mg daily.    Labile on 7/30 13. Morbid obesity. BMI 43.78. Dietary follow-up 14. Hyperlipidemia. Lipitor 15. History of syphilis. Treated outpatient with penicillin to the healthcare department recently. Outpatient follow-up 16. Urine retention:   Urology finally was able to pass 12 Fr coude cath   Foley to be left in place per urology, discussed with urology, plan for voiding trial on Monday if patient more mobile and less edematous  UA equivocal, urine culture with insignificant growth 17.  Sleep disturbance  Melatonin 4.5 mg nightly   18.  Leukocytosis  WBCs 13.8 on 7/28, labs with HD today  Afebrile  Continue to monitor 19.  Constipation  Bowel meds increased on 7/28  Improving  LOS: 8 days A FACE TO FACE EVALUATION WAS PERFORMED  Michael Riggs Lorie Phenix 02/16/2019, 9:15 AM

## 2019-02-16 NOTE — Progress Notes (Signed)
Physical Therapy Weekly Progress Note  Patient Details  Name: Michael Riggs MRN: [id number] Date of Birth: 1970/09/15  Beginning of progress report period: February 09, 2019 End of progress report period: February 16, 2019  Today's Date: 02/16/2019 PT Individual Time: 1005-1035 PT Individual Time Calculation (min): 30 min  P Patient has met 2 of 3 short term goals. Pt has made minimal progress towards LTGs over last week 2/2 ongoing scrotal pain/edema and buttocks pain. Pt refused most therapy sessions at beginning of reporting period, therefore frequency of therapy was downgraded to QD. Over last day, pt has increased participation and willingness to push through pain. He is performing bed mobility w/ max assist and slide board transfers w/ max assist. He has also initiated w/c mobility.   Patient continues to demonstrate the following deficits muscle weakness and muscle joint tightness, decreased cardiorespiratoy endurance, decreased awareness, decreased safety awareness and decreased memory and decreased sitting balance, decreased postural control and decreased balance strategies and therefore will continue to benefit from skilled PT intervention to increase functional independence with mobility.  Patient not progressing toward long term goals.  See goal revision..  Plan of care revisions:   Standing goals downgraded 2/2 lack of progress.   PT Short Term Goals Week 1:  PT Short Term Goal 1 (Week 1): Pt will be able to perform bed mobility w/ max assist PT Short Term Goal 1 - Progress (Week 1): Met PT Short Term Goal 2 (Week 1): Pt will be able to perform functional transfer bed<>w/c w/ max assist PT Short Term Goal 2 - Progress (Week 1): Met PT Short Term Goal 3 (Week 1): Pt will be able to perform w/c propulsion for functional mobility w/ supervision x50' PT Short Term Goal 3 - Progress (Week 1): Partly met Week 2:  PT Short Term Goal 1 (Week 2): Pt will perform bed<>chair transfer w/  mod assist PT Short Term Goal 2 (Week 2): Pt will perform bed mobility w/ mod assist PT Short Term Goal 3 (Week 2): Pt will participate in 60 min of activity  Skilled Therapeutic Interventions/Progress Updates:   Pt in supine and agreeable to therapy, pain as detailed below. Pt overall much more alert and engaging with this therapist today. Supine>sit w/ max assist and mod assist to maintain static sitting balance 2/2 posterior lean bias w/ scrotal edema. Set-up to perform slide board transfer w/ pt on bed pad to minimize skin breakdown. Slide board transfer to w/c w/ max assist overall and frequent verbal, tactile, and visual cues for head/hips relationship and technique. Manual assist needed to scoot and prevent sliding forward on board. Once in chair, max assist to reposition and scoot back, although pt able to utilize UEs on armrests well. Worked on w/c mobility in hallway, pt self-propelled 25' x2 w/ supervision. Pt limited in distance by fatigue. Returned to room total assist and repositioned again in chair. Pt felt as if he was sliding forward in chair 2/2 posterior lean and body habitus. Secured pt's hips back in chair w/ gait belt, no pain w/ this technique at rest and pt felt better as he was no longer sliding forward. Step placed under RLE for improved R foot contact w/ floor and pt's ability to scoot himself up in chair. Ended session in w/c, all needs in reach. RN and NT agreeable w/ plan to stay up in chair and plan to check in on pt frequently.    Therapy Documentation Precautions:  Precautions Precautions: Fall, Other (  comment) Precaution Comments: L BKA with prosthesis; significant scrotal edema Restrictions Weight Bearing Restrictions: No Pain: Pain Assessment Pain Scale: 0-10 Pain Score: 9  Pain Type: Acute pain Pain Location: Leg Pain Orientation: Right Pain Descriptors / Indicators: Discomfort Pain Onset: On-going Patients Stated Pain Goal: 6 Pain Intervention(s):  Medication (See eMAR)  Therapy/Group: Individual Therapy   [name] 02/16/2019, 12:38 PM

## 2019-02-16 NOTE — Progress Notes (Addendum)
Occupational Therapy Note  [employer] [phone] Total individual minutes=41 (2 minoutes chart review)  Upon approach for OT patient was seated in w/c and complaining that his bottom was hurting and that he needed to get back in bed.     He was educated that when in w/c staff can assist with w/c tilts to relief pressure on buttocks when staff feels it is safe to do so.   Patient was fastened in chair with a gait belt and with right foot positioned on a non skid stool.  He stated his number one priority was to get onto bed for pressure relief, even though his lunch was to arrive soon.   He also stated he was too fatigued to try a sliding board transfer; so, this clinician, patient nurse and rehab tech assisted him back to bed via the lift in his bathroom.  He completed bed mobility with moderate assistance x2.    This clinican and nursing staff changed his slightly soiled brief, giving him opportunities to work on core strength (back and abdominals, along with hip/glute muscles).  Next this clinician educated paitent on using bed controls to help position his top, middle and lower bed.  He was able to pull himself up in bed with the bed was positioned in a tilt position.  Then he utilized controls to adjust head, hips and feet to allow pressure off buttocks but to also position his upper body for safe 'close to upright' self feeding position.   This clinician assisted patient with trayset up such as cutting meat (He was positioned rather far from his tray in which his hands could not reach top of plate for cutting meat.)   Hwever, he was able to complete scooping,jabbing and hand to mouth once tray was set up.  This clinician retrieved an extra drink as requested by patient before exiting the session.  His bed alrm was engaged; however, soon after he started eating, transport for hemodialysis came in.   He was allowed to complete his lunch before being transported.  Continue OT plan of care.

## 2019-02-17 ENCOUNTER — Inpatient Hospital Stay (HOSPITAL_COMMUNITY): Payer: Medicare Other | Admitting: Physical Therapy

## 2019-02-17 ENCOUNTER — Inpatient Hospital Stay (HOSPITAL_COMMUNITY): Payer: Medicare Other | Admitting: Occupational Therapy

## 2019-02-17 MED ORDER — SENNOSIDES-DOCUSATE SODIUM 8.6-50 MG PO TABS
1.0000 | ORAL_TABLET | Freq: Every day | ORAL | Status: DC
  Administered 2019-02-18 – 2019-02-21 (×4): 1 via ORAL
  Filled 2019-02-17 (×4): qty 1

## 2019-02-17 MED ORDER — POLYETHYLENE GLYCOL 3350 17 G PO PACK
17.0000 g | PACK | Freq: Every day | ORAL | Status: DC
  Administered 2019-02-18 – 2019-03-03 (×11): 17 g via ORAL
  Filled 2019-02-17 (×14): qty 1

## 2019-02-17 NOTE — Progress Notes (Signed)
Social Work [name], male   DOB: 10-Oct-1970, 48 y.o.   MRN: [id number]  Spoke with pt's sister-in-law, Arrie Aran, yesterday to review team conference.  She confirms that she and pt's brother are staying in the home with pt and his mother and currently providing support to mother.  They fully intend to remain there when pt returns home and can provide 24/7 light assistance. Brother works 3rd shift and sister-in-law is on SSD but states she can assist.  Discussed participation concerns team has had about pt but, per notes, maybe this is improving?  She understands that no d/c date set but will plan to follow up with all on Monday to determine if this can be done and if progress is being made.  As I am out of office, my co-workers will follow up with pt about conference information.  Continue to follow.  Yurika Pereda, LCSW

## 2019-02-17 NOTE — Progress Notes (Signed)
Physical Therapy Session Note  Patient Details  Name: Michael Riggs MRN: [id number] Date of Birth: 06/09/1971  Today's Date: 02/17/2019 PT Individual Time: 1345-1440 PT Individual Time Calculation (min): 55 min   Short Term Goals: Week 2:  PT Short Term Goal 1 (Week 2): Pt will perform bed<>chair transfer w/ mod assist PT Short Term Goal 2 (Week 2): Pt will perform bed mobility w/ mod assist PT Short Term Goal 3 (Week 2): Pt will participate in 60 min of activity  Skilled Therapeutic Interventions/Progress Updates:   Pt in supine and agreeable to attempt OOB activity w/ max encouragement. Pt stated "I already did that today". Explained policies of CIR regarding participation and importance of fully participating in order to d/c home. Supine>sit w/ mod assist and supervision for static sitting balance at EOB. Attempted to don prosthesis at EOB, however it continues to be too tight and pt's L residual limb too swollen to don completely. Performed multiple sit<>stands w/ min-mod assist and use of bed rails for support, however prosthesis still unable to don. Therapist suggesting pt perform slide board transfer towards w/c, at that point pt impulsively stood and transferred to w/c via stand pivot, using prosthesis as leverage. Educated pt on importance of waiting until therapist was ready and discussed safety issues. Pt stated "well I made it didn't I?" Pt in TIS and tilted back for pressure relief off bottom and scrotum. No pain at rest, pain only felt when in full upright sitting. Total assist w/c transport to/from therapy gym. Attempted to perform squat/stand pivot from w/c to mat, however w/o prosthesis. Pt unable to lift bottom high enough off w/c and pt stated "I don't feel safe". Returned to room and performed slide board transfer back to EOB, max-total assist to boost, and pt impulsively laying down in bed prior to getting all the way to seated EOB. Ongoing education regarding safety,  protecting himself, protecting caregivers, etc. Pt verbalized understanding, however continues to demonstrate little awareness of his deficits. Ended session in supine, all needs in reach.   Therapy Documentation Precautions:  Precautions Precautions: Fall, Other (comment) Precaution Comments: L BKA with prosthesis; significant scrotal edema Restrictions Weight Bearing Restrictions: No Vital Signs: Therapy Vitals Temp: 98.4 F (36.9 C) Temp Source: Oral Pulse Rate: 83 Resp: 20 BP: (!) 141/80 Patient Position (if appropriate): Lying Oxygen Therapy SpO2: 97 % O2 Device: Room Air  Therapy/Group: Individual Therapy  Alianna Wurster [name] 02/17/2019, 3:56 PM

## 2019-02-17 NOTE — Progress Notes (Signed)
[facility] PHYSICAL MEDICINE & REHABILITATION PROGRESS NOTE   Subjective/Complaints: Patient seen sitting up in bed this morning.  He states he slept fairly overnight.  Discussed CPAP and patient's request for different machine.  He is upset that he was asked to do morning therapies yesterday, educated on importance of participation.  He has questions regarding his phone.  Encourage patient to search for follow-through online access as well.  Patient states he had a large bowel movement again.  Encourage compliance with scrotal sling again.  He was seen by nephro yesterday and had HD, notes reviewed.  ROS: + Scrotal edema.  Denies CP, SOB, N/V/D  Objective:   No results found. Recent Labs    02/14/19 1946 02/16/19 1400  WBC 13.8* 12.4*  HGB 7.9* 7.9*  HCT 25.6* 26.1*  PLT 283 369   Recent Labs    02/14/19 1946 02/16/19 1405  NA 132* 126*  K 3.8 4.0  CL 96* 91*  CO2 27 23  GLUCOSE 120* 143*  BUN 22* 41*  CREATININE 4.66* 6.76*  CALCIUM 7.9* 7.7*    Intake/Output Summary (Last 24 hours) at 02/17/2019 1000 Last data filed at 02/17/2019 0342 Gross per 24 hour  Intake 440 ml  Output 3275 ml  Net -2835 ml     Physical Exam: Vital Signs Blood pressure 135/76, pulse 80, temperature (!) 96.9 F (36.1 C), resp. rate 19, height 5\' 10"  (1.778 m), weight (!) 137.7 kg, SpO2 96 %. Constitutional: No distress . Vital signs reviewed.  Morbidly obese. HENT: Normocephalic.  Atraumatic. Eyes: EOMI.  No discharge. Cardiovascular: No JVD. Respiratory: Normal effort. GI: Non-distended. GU: Foley in place Musc: Bilateral lower extremity and scrotal edema, unchanged Neurological: Alert and oriented Motor: Bilateral upper extremities 4-4+/5, stable RLE: 4-4+/5 proximal to distal, stable Left lower extremity: Hip flexion, knee extension 4+/5, stable Skin: Skin iswarm. He isnot diaphoretic. Left BKA healed.  RLE wound with dressing C/D/I  Psychiatric: Normal mood.  Normal  affect.  Assessment/Plan: 1. Functional deficits secondary to debility which require 3+ hours per day of interdisciplinary therapy in a comprehensive inpatient rehab setting.  Physiatrist is providing close team supervision and 24 hour management of active medical problems listed below.  Physiatrist and rehab team continue to assess barriers to discharge/monitor patient progress toward functional and medical goals  Care Tool:  Bathing    Body parts bathed by patient: Right arm, Left arm, Chest, Abdomen, Face   Body parts bathed by helper: Front perineal area, Buttocks, Right upper leg, Left upper leg, Right lower leg Body parts n/a: Left lower leg   Bathing assist Assist Level: Maximal Assistance - Patient 24 - 49%     Upper Body Dressing/Undressing Upper body dressing Upper body dressing/undressing activity did not occur (including orthotics): N/A(patient already had a gown) What is the patient wearing?: Hospital gown only    Upper body assist Assist Level: Total Assistance - Patient < 25%    Lower Body Dressing/Undressing Lower body dressing    Lower body dressing activity did not occur: Refused What is the patient wearing?: Hospital gown only     Lower body assist Assist for lower body dressing: Total Assistance - Patient < 25%     Toileting Toileting Toileting Activity did not occur Landscape architect and hygiene only): Safety/medical concerns  Toileting assist Assist for toileting: Dependent - Patient 0%     Transfers Chair/bed transfer  Transfers assist  Chair/bed transfer activity did not occur: Safety/medical concerns  Chair/bed transfer assist level:  Maximal Assistance - Patient 25 - 49%(slide board)     Locomotion Ambulation   Ambulation assist   Ambulation activity did not occur: Safety/medical concerns(unable due to pain and scrotal and bladder issues)          Walk 10 feet activity   Assist  Walk 10 feet activity did not occur:  Safety/medical concerns(unable due to pain and scrotal and bladder issues)        Walk 50 feet activity   Assist Walk 50 feet with 2 turns activity did not occur: Safety/medical concerns(unable due to pain and scrotal and bladder issues)         Walk 150 feet activity   Assist Walk 150 feet activity did not occur: Safety/medical concerns(unable due to pain and scrotal and bladder issues)         Walk 10 feet on uneven surface  activity   Assist Walk 10 feet on uneven surfaces activity did not occur: Safety/medical concerns(unable due to pain and scrotal and bladder issues)         Wheelchair     Assist   Type of Wheelchair: Manual Wheelchair activity did not occur: Safety/medical concerns(unable due to pain and scrotal and bladder issues)  Wheelchair assist level: Supervision/Verbal cueing Max wheelchair distance: 25'    Wheelchair 50 feet with 2 turns activity    Assist    Wheelchair 50 feet with 2 turns activity did not occur: Safety/medical concerns(unable due to pain and scrotal and bladder issues)       Wheelchair 150 feet activity     Assist Wheelchair 150 feet activity did not occur: Safety/medical concerns(unable due to pain and scrotal and bladder issues)        Medical Problem List and Plan: 1.Debilitysecondary to end-stage renal disease/multi-medical. Hemodialysis recently initiated.  Continue CIR 2. Antithrombotics: -DVT/anticoagulation:Subcutaneous heparin -antiplatelet therapy: N/A 3. Pain Management:Neurontin 300 mg nightly, Ultram as needed  Stable with medications on 7/31. 4. Mood:Celexa 40 mg daily -antipsychotic agents: N/A 5. Neuropsych: This patientiscapable of making decisions on hisown behalf.  Discussed with neuropsychology-appreciate recs, no SI 6. Skin/Wound Care:Scrotal sling, encourage compliance -local care to bullae, foam dressings 7.  Fluids/Electrolytes/Nutrition:Routine in and outs 8. End-stage renal disease. Status post AV fistula 01/09/2019. Hemodialysis directed. -continue to diurese with HD, recs per nephro Filed Weights   02/16/19 1247 02/16/19 1711 02/17/19 0343  Weight: (!) 139.8 kg (!) 136.9 kg (!) 137.7 kg   9. Diabetes mellitus. Recent hemoglobin A1c 5.0.   Elevated on 7/30  Continue to monitor 10. Anemia of chronic disease. Continue Aranesp  Hemoglobin 7.9 on 7/30, recs per nephro, labs with HD 11. History of left BKA 5 years ago in [place].   12. Hypertension. Norvasc 10 mg daily.    Labile on 7/31 13. Morbid obesity. BMI 43.78. Dietary follow-up 14. Hyperlipidemia. Lipitor 15. History of syphilis. Treated outpatient with penicillin to the healthcare department recently. Outpatient follow-up 16. Urine retention:   Urology finally was able to pass 12 Fr coude cath   Foley to be left in place per urology, discussed with urology, plan for voiding trial on Monday if patient more mobile and less edematous  UA equivocal, urine culture with insignificant growth 17.  Sleep disturbance  Melatonin 4.5 mg nightly   18.  Leukocytosis  WBCs 12.4 on 7/30, labs with HD today  Afebrile  Continue to monitor 19.  Constipation  Bowel meds increased on 7/28, decreased on 7/31  Improving  LOS: 9 days A  FACE TO FACE EVALUATION WAS PERFORMED  Raynah Gomes Lorie Phenix 02/17/2019, 10:00 AM

## 2019-02-17 NOTE — Progress Notes (Signed)
[name], male   DOB: [birth year], 48 y.o.   MRN: [id number]  [facility] Progress Note   Assessment/ Plan:   1.  Physical debilitation/deconditioning following hospitalization resulting in initiation of hemodialysis: Currently ongoing inpatient rehabilitation, appearing to make progress with restful sleep overnight and improving mentation/mood. 2. ESRD: New start to hemodialysis, continue dialysis on a TTS schedule via right IJ TDC as his left first stage BBF matures (created 01/09/2019 by Dr. Donnetta Hutching).  Will order for next hemodialysis tomorrow.  Plans noted for removal of Foley catheter on 02/20/2019. 3. Anemia: Hemoglobin/hematocrit appear stable status post PRBC without overt loss, iron stores replete, titrate ESA. 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  Do not use phosphate/magnesium based laxatives or enemas. 5. Nutrition: Continue renal diet with renal multivitamin.  Consider discontinuing scheduled MiraLAX. 6.  Chronic pain: Management per PM&R service.  Subjective:   Reports that dialysis went well yesterday and he is concerned that his bowel regimen may be too strong as he had a large watery bowel movement after dialysis yesterday.   Objective:   BP 135/76 (BP Location: Left Arm)   Pulse 80   Temp (!) 96.9 F (36.1 C)   Resp 19   Ht 5\' 10"  (1.778 m)   Wt (!) 137.7 kg   SpO2 96%   BMI 43.56 kg/m   Physical Exam: Gen: Sleeping in bed watching cartoons on laptop CVS: Pulse regular rhythm, normal rate, S1 and S2 normal Resp: Anteriorly clear to auscultation, no rales/rhonchi Abd: Soft, obese, nontender Ext: Improving scrotal/thigh edema.  Status post left BKA.  Left BBT with audible bruit  Labs: BMET Recent Labs  Lab 02/11/19 1523 02/14/19 1946 02/16/19 1405  NA 129* 132* 126*  K 3.9 3.8 4.0  CL 94* 96* 91*  CO2 25 27 23   GLUCOSE 168* 120* 143*  BUN 32* 22* 41*   CREATININE 5.88* 4.66* 6.76*  CALCIUM 7.6* 7.9* 7.7*  PHOS 3.6 2.4* 3.1   CBC Recent Labs  Lab 02/11/19 1523 02/14/19 1946 02/16/19 1400  WBC 14.0* 13.8* 12.4*  HGB 7.7* 7.9* 7.9*  HCT 25.4* 25.6* 26.1*  MCV 95.8 93.8 96.0  PLT 220 283 369   Medications:    . sodium chloride   Intravenous Once  . amLODipine  5 mg Oral Daily  . atorvastatin  40 mg Oral Daily  . calcitRIOL  0.25 mcg Oral Daily  . calcium carbonate  1 tablet Oral BID WC  . citalopram  40 mg Oral Daily  . collagenase   Topical Daily  . darbepoetin (ARANESP) injection - DIALYSIS  150 mcg Intravenous Q Tue-HD  . gabapentin  300 mg Oral QHS  . heparin  5,000 Units Subcutaneous Q8H  . Melatonin  4.5 mg Oral QHS  . pantoprazole  40 mg Oral Daily  . polyethylene glycol  17 g Oral BID  . senna-docusate  2 tablet Oral BID   Elmarie Shiley, MD 02/17/2019, 9:30 AM

## 2019-02-17 NOTE — Progress Notes (Signed)
Occupational Therapy Weekly Progress Note  Patient Details  Name: Michael Riggs MRN: [id number] Date of Birth: [birth year]  Beginning of progress report period: February 09, 2019 End of progress report period: February 17, 2019  Today's Date: 02/17/2019 OT Individual Time: 5993-5701 OT Individual Time Calculation (min): 39 min    Patient has met 0 of 4 short term goals.  Pt has made minimal progress towards goals.  At the beginning of the week pt was refusing OOB therapy and participating in very limited bed level therapy - therefore therapy schedule modified to QD to allow for increased participation.  Pt has been able to tolerate increased OOB activity, however still complains of pain in scrotum impacting sitting tolerance.  Pt continues to require +2 total assist for slide board transfer as residual limb is swollen and prosthesis does not fit appropriately.  Pt will continue to benefit from OT services, now that he is demonstrating increased participation with hope that he will continue to improve.  Patient continues to demonstrate the following deficits: muscle weakness, decreased cardiorespiratoy endurance and decreased sitting balance, decreased standing balance and decreased balance strategies, edema and therefore will continue to benefit from skilled OT intervention to enhance overall performance with BADL and Reduce care partner burden.  Patient progressing toward long term goals..  Continue plan of care.  OT Short Term Goals Week 1:  OT Short Term Goal 1 (Week 1): Pt will tolerate sitting EOB wiht MIN A for sitting balance for 10 min during functional activity OT Short Term Goal 1 - Progress (Week 1): Progressing toward goal OT Short Term Goal 2 (Week 1): Pt will transfer to [facility] with MOD A of 1 caregiver to decrease caregiver burden OT Short Term Goal 2 - Progress (Week 1): Progressing toward goal OT Short Term Goal 3 (Week 1): Pt will don sock with AE PRN and min VC for technique OT  Short Term Goal 3 - Progress (Week 1): Progressing toward goal OT Short Term Goal 4 (Week 1): Pt will bathe with MIN A OT Short Term Goal 4 - Progress (Week 1): Progressing toward goal Week 2:  OT Short Term Goal 1 (Week 2): Pt will tolerate sitting EOB with MIN A for sitting balance for 10 min during functional activity OT Short Term Goal 2 (Week 2): Pt will transfer to [facility] with MOD A of 1 caregiver to decrease caregiver burden OT Short Term Goal 3 (Week 2): Pt will don sock with AE PRN and min VC for technique OT Short Term Goal 4 (Week 2): Pt will bathe with MIN A  Skilled Therapeutic Interventions/Progress Updates:    Treatment session with focus on activity tolerance and participation in self-care tasks and OOB mobility.  Pt received supine in bed agreeable to therapy session.  Pt more alert and oriented this session (compared to previous sessions with this therapist) and willing to engage in activity OOB.  Mod assist rolling and sidelying to sitting with 2nd person present for safety due to body habitus and prior decreased participation.  Completed slide board transfer with +2 to w/c towards Rt.  Max multimodal cues for anterior weight shift to allow for increased participation in weight shift and transfer.  Engaged in bathing seated in w/c with pt completing UB bathing with min cues for attention, as pt easily distracted.  Due to posterior lean, pt unsafe to remain upright in w/c (PT to obtain tilt in space to attempt).  Utilized maximove for transfer back to bed due  to increase in lean and fatigue.  Pt left in supine with RN arriving to administer meds.  Therapy Documentation Precautions:  Precautions Precautions: Fall, Other (comment) Precaution Comments: L BKA with prosthesis; significant scrotal edema Restrictions Weight Bearing Restrictions: No Pain: Pain Assessment Pain Scale: 0-10 Pain Score: 6  Faces Pain Scale: Hurts even more Pain Intervention(s): Medication (See  eMAR) ADL: ADL Eating: Independent Grooming: Setup Upper Body Bathing: Minimal assistance Where Assessed-Upper Body Bathing: Bed level Lower Body Bathing: Maximal assistance Where Assessed-Lower Body Bathing: Bed level Upper Body Dressing: Moderate assistance Where Assessed-Upper Body Dressing: Bed level Toileting: Not assessed Toilet Transfer: Not assessed Vision   Perception    Praxis   Exercises:   Other Treatments:     Therapy/Group: Individual Therapy  [name] 02/17/2019, 12:02 PM

## 2019-02-18 ENCOUNTER — Inpatient Hospital Stay (HOSPITAL_COMMUNITY): Payer: Medicare Other | Admitting: Occupational Therapy

## 2019-02-18 LAB — CBC
HCT: 24.4 % — ABNORMAL LOW (ref 39.0–52.0)
Hemoglobin: 7.5 g/dL — ABNORMAL LOW (ref 13.0–17.0)
MCH: 29.2 pg (ref 26.0–34.0)
MCHC: 30.7 g/dL (ref 30.0–36.0)
MCV: 94.9 fL (ref 80.0–100.0)
Platelets: 387 10*3/uL (ref 150–400)
RBC: 2.57 MIL/uL — ABNORMAL LOW (ref 4.22–5.81)
RDW: 13.2 % (ref 11.5–15.5)
WBC: 12.7 10*3/uL — ABNORMAL HIGH (ref 4.0–10.5)
nRBC: 0 % (ref 0.0–0.2)

## 2019-02-18 LAB — RENAL FUNCTION PANEL
Albumin: 1.7 g/dL — ABNORMAL LOW (ref 3.5–5.0)
Anion gap: 10 (ref 5–15)
BUN: 43 mg/dL — ABNORMAL HIGH (ref 6–20)
CO2: 25 mmol/L (ref 22–32)
Calcium: 7.9 mg/dL — ABNORMAL LOW (ref 8.9–10.3)
Chloride: 91 mmol/L — ABNORMAL LOW (ref 98–111)
Creatinine, Ser: 6.68 mg/dL — ABNORMAL HIGH (ref 0.61–1.24)
GFR calc Af Amer: 10 mL/min — ABNORMAL LOW (ref >60)
GFR calc non Af Amer: 9 mL/min — ABNORMAL LOW (ref >60)
Glucose, Bld: 124 mg/dL — ABNORMAL HIGH (ref 70–99)
Phosphorus: 3.2 mg/dL (ref 2.5–4.6)
Potassium: 4.1 mmol/L (ref 3.5–5.1)
Sodium: 126 mmol/L — ABNORMAL LOW (ref 135–145)

## 2019-02-18 MED ORDER — HEPARIN SODIUM (PORCINE) 1000 UNIT/ML IJ SOLN
INTRAMUSCULAR | Status: AC
  Filled 2019-02-18: qty 4

## 2019-02-18 MED ORDER — HEPARIN SODIUM (PORCINE) 1000 UNIT/ML DIALYSIS
40.0000 [IU]/kg | INTRAMUSCULAR | Status: DC | PRN
  Administered 2019-02-18: 5500 [IU] via INTRAVENOUS_CENTRAL
  Filled 2019-02-18: qty 5.5

## 2019-02-18 NOTE — Progress Notes (Addendum)
Rt note: patient refused cpap qhs for the reasoning of the mask is not what he wants patient is stable on room air but snores while asleep patient refuses to use xl mask

## 2019-02-18 NOTE — Progress Notes (Signed)
Occupational Therapy Session Note  Patient Details  Name: Michael Riggs MRN: [id number] Date of Birth: July 14, 1971  Today's Date: 02/18/2019 OT Individual Time: 0930-1002 OT Individual Time Calculation (min): 32 min    Short Term Goals: Week 2:  OT Short Term Goal 1 (Week 2): Pt will tolerate sitting EOB with MIN A for sitting balance for 10 min during functional activity OT Short Term Goal 2 (Week 2): Pt will transfer to [facility] with MOD A of 1 caregiver to decrease caregiver burden OT Short Term Goal 3 (Week 2): Pt will don sock with AE PRN and min VC for technique OT Short Term Goal 4 (Week 2): Pt will bathe with MIN A  Skilled Therapeutic Interventions/Progress Updates:    Treatment session with focus on bed mobility and participation in self-care tasks.  Pt received supine in bed asleep, requiring increased time to fully arouse.  Pt refusing OOB activity, but asking to have scrotal sling donned.  Required +2 assist to ensure proper placement and fit.  Pt reports improved sensation and relief.  Engaged in [place] and [place] to allow for donning of sling and to engage in UB bathing and donning clean hospital gown.  Pt tolerated rolling this session, requiring only min assist to complete.  Pt remained semi-reclined in bed with all needs in reach.  Therapy Documentation Precautions:  Precautions Precautions: Fall, Other (comment) Precaution Comments: L BKA with prosthesis; significant scrotal edema Restrictions Weight Bearing Restrictions: No Pain: Pain Assessment Pain Scale: 0-10 Pain Score: 0-No pain   Therapy/Group: Individual Therapy  [name] 02/18/2019, 11:24 AM

## 2019-02-18 NOTE — Procedures (Signed)
Patient seen on Hemodialysis. BP (!) 128/92 (BP Location: Right Arm)   Pulse 95   Temp 98.9 F (37.2 C) (Oral)   Resp 16   Ht 5\' 10"  (1.778 m)   Wt (!) 141.3 kg   SpO2 99%   BMI 44.70 kg/m   QB 390, UF goal 5L Tolerating treatment without complaints at this time.   Elmarie Shiley MD [facility]. Office # [phone] Pager # [phone] 2:56 PM

## 2019-02-18 NOTE — Progress Notes (Signed)
Patient is requesting a whole face mask because even the XL Full face mask is not working for him.  I told him that I would put in a note.  No distress noted at this time.

## 2019-02-18 NOTE — Progress Notes (Signed)
[facility] PHYSICAL MEDICINE & REHABILITATION PROGRESS NOTE   Subjective/Complaints: Patient seen laying in bed this morning.  He states he slept fairly overnight.  He states he cannot tolerate the CPAP and discussed it with respiratory therapy.  He notes decrease in scrotal edema.  He states that he does not have a scrotal sling, but is willing to wear it, discussed with therapies.  ROS: + Scrotal edema, mild SOB.  Denies CP, N/V/D  Objective:   No results found. Recent Labs    02/16/19 1400 02/18/19 1332  WBC 12.4* 12.7*  HGB 7.9* 7.5*  HCT 26.1* 24.4*  PLT 369 387   Recent Labs    02/16/19 1405 02/18/19 1332  NA 126* 126*  K 4.0 4.1  CL 91* 91*  CO2 23 25  GLUCOSE 143* 124*  BUN 41* 43*  CREATININE 6.76* 6.68*  CALCIUM 7.7* 7.9*    Intake/Output Summary (Last 24 hours) at 02/18/2019 1534 Last data filed at 02/18/2019 1241 Gross per 24 hour  Intake 1012 ml  Output 495 ml  Net 517 ml     Physical Exam: Vital Signs Blood pressure (!) 128/92, pulse 95, temperature 98.9 F (37.2 C), temperature source Oral, resp. rate 16, height 5\' 10"  (1.778 m), weight (!) 141.3 kg, SpO2 99 %. Constitutional: No distress . Vital signs reviewed.  Morbidly obese. HENT: Normocephalic.  Atraumatic. Eyes: EOMI.  No discharge. Cardiovascular: No JVD. Respiratory: Normal effort. GI: Non-distended. GU: Foley in place Musc: Bilateral lower extremity and scrotal edema, improving Neurological: Alert and oriented Motor: Bilateral upper extremities 4-4+/5, unchanged RLE: 4-4+/5 proximal to distal, unchanged Left lower extremity: Hip flexion, knee extension 4+/5, stable Skin: Skin iswarm. He isnot diaphoretic. Left BKA healed.  RLE wound with dressing C/D/I  Psychiatric: Normal mood.  Normal affect.  Assessment/Plan: 1. Functional deficits secondary to debility which require 3+ hours per day of interdisciplinary therapy in a comprehensive inpatient rehab setting.  Physiatrist is  providing close team supervision and 24 hour management of active medical problems listed below.  Physiatrist and rehab team continue to assess barriers to discharge/monitor patient progress toward functional and medical goals  Care Tool:  Bathing    Body parts bathed by patient: Right arm, Left arm, Chest, Abdomen, Face   Body parts bathed by helper: Front perineal area, Buttocks, Right upper leg, Left upper leg, Right lower leg Body parts n/a: Left lower leg   Bathing assist Assist Level: Maximal Assistance - Patient 24 - 49%     Upper Body Dressing/Undressing Upper body dressing Upper body dressing/undressing activity did not occur (including orthotics): N/A(patient already had a gown) What is the patient wearing?: Hospital gown only    Upper body assist Assist Level: Total Assistance - Patient < 25%    Lower Body Dressing/Undressing Lower body dressing    Lower body dressing activity did not occur: Refused What is the patient wearing?: Hospital gown only     Lower body assist Assist for lower body dressing: Total Assistance - Patient < 25%     Toileting Toileting Toileting Activity did not occur Landscape architect and hygiene only): Safety/medical concerns  Toileting assist Assist for toileting: Dependent - Patient 0%     Transfers Chair/bed transfer  Transfers assist  Chair/bed transfer activity did not occur: Safety/medical concerns  Chair/bed transfer assist level: Moderate Assistance - Patient 50 - 74%     Locomotion Ambulation   Ambulation assist   Ambulation activity did not occur: Safety/medical concerns(unable due to pain and  scrotal and bladder issues)          Walk 10 feet activity   Assist  Walk 10 feet activity did not occur: Safety/medical concerns(unable due to pain and scrotal and bladder issues)        Walk 50 feet activity   Assist Walk 50 feet with 2 turns activity did not occur: Safety/medical concerns(unable due to pain  and scrotal and bladder issues)         Walk 150 feet activity   Assist Walk 150 feet activity did not occur: Safety/medical concerns(unable due to pain and scrotal and bladder issues)         Walk 10 feet on uneven surface  activity   Assist Walk 10 feet on uneven surfaces activity did not occur: Safety/medical concerns(unable due to pain and scrotal and bladder issues)         Wheelchair     Assist   Type of Wheelchair: Manual Wheelchair activity did not occur: Safety/medical concerns(unable due to pain and scrotal and bladder issues)  Wheelchair assist level: Supervision/Verbal cueing Max wheelchair distance: 25'    Wheelchair 50 feet with 2 turns activity    Assist    Wheelchair 50 feet with 2 turns activity did not occur: Safety/medical concerns(unable due to pain and scrotal and bladder issues)       Wheelchair 150 feet activity     Assist Wheelchair 150 feet activity did not occur: Safety/medical concerns(unable due to pain and scrotal and bladder issues)        Medical Problem List and Plan: 1.Debilitysecondary to end-stage renal disease/multi-medical. Hemodialysis recently initiated.  Continue CIR 2. Antithrombotics: -DVT/anticoagulation:Subcutaneous heparin -antiplatelet therapy: N/A 3. Pain Management:Neurontin 300 mg nightly, Ultram as needed  Stable with medications on 7/31. 4. Mood:Celexa 40 mg daily -antipsychotic agents: N/A 5. Neuropsych: This patientiscapable of making decisions on hisown behalf.  Discussed with neuropsychology-appreciate recs, no SI 6. Skin/Wound Care:Scrotal sling, encourage compliance -local care to bullae, foam dressings 7. Fluids/Electrolytes/Nutrition:Routine in and outs 8. ESRD. Status post AV fistula 01/09/2019. Hemodialysis directed. -continue to diurese with HD, recs per nephro Filed Weights   02/17/19 0343 02/18/19 0456 02/18/19  1305  Weight: (!) 137.7 kg (!) 138.8 kg (!) 141.3 kg   ?  Reliability on 8/1 9. Diabetes mellitus. Recent hemoglobin A1c 5.0.   Mildly elevated on 8/1  Continue to monitor 10. Anemia of chronic disease. Continue Aranesp  Hemoglobin 7.9 on 7/30, recs per nephro, labs with HD 11. History of left BKA 5 years ago in [place].   12. Hypertension. Norvasc 10 mg daily.    Relatively stable on 8/1 13. Morbid obesity. BMI 43.78. Dietary follow-up 14. Hyperlipidemia. Lipitor 15. History of syphilis. Treated outpatient with penicillin to the healthcare department recently. Outpatient follow-up 16. Urine retention:   Urology finally was able to pass 12 Fr coude cath   Foley to be left in place per urology, discussed with urology, plan for voiding trial on Monday if patient more mobile and less edematous  UA equivocal, urine culture with insignificant growth 17.  Sleep disturbance  Melatonin 4.5 mg nightly    Unable to tolerate CPAP 18.  Leukocytosis  WBCs 12.4 on 7/30, labs with HD today  Afebrile  Continue to monitor 19.  Constipation  Bowel meds increased on 7/28, decreased on 7/31  Improving  LOS: 10 days A FACE TO FACE EVALUATION WAS PERFORMED  Tahmid Stonehocker Lorie Phenix 02/18/2019, 3:34 PM

## 2019-02-19 ENCOUNTER — Inpatient Hospital Stay (HOSPITAL_COMMUNITY): Payer: Medicare Other

## 2019-02-19 NOTE — Progress Notes (Addendum)
[place] PHYSICAL MEDICINE & REHABILITATION PROGRESS NOTE   Subjective/Complaints: Patient seen laying in bed this morning.  He states he slept well overnight.  He is questions regarding his Foley.  He notes decreasing pain swelling and pain.  He was seen by nephrology yesterday, appreciate recs.  ROS: + Scrotal edema, improving.  Denies CP, shortness of breath, N/V/D  Objective:   No results found. Recent Labs    02/16/19 1400 02/18/19 1332  WBC 12.4* 12.7*  HGB 7.9* 7.5*  HCT 26.1* 24.4*  PLT 369 387   Recent Labs    02/16/19 1405 02/18/19 1332  NA 126* 126*  K 4.0 4.1  CL 91* 91*  CO2 23 25  GLUCOSE 143* 124*  BUN 41* 43*  CREATININE 6.76* 6.68*  CALCIUM 7.7* 7.9*    Intake/Output Summary (Last 24 hours) at 02/19/2019 1251 Last data filed at 02/19/2019 0730 Gross per 24 hour  Intake 820 ml  Output 5050 ml  Net -4230 ml     Physical Exam: Vital Signs Blood pressure 138/78, pulse 87, temperature 99.2 F (37.3 C), resp. rate 20, height 5\' 10"  (1.778 m), weight (!) 136.2 kg, SpO2 95 %. Constitutional: No distress . Vital signs reviewed.  Morbidly obese. HENT: Normocephalic.  Atraumatic. Eyes: EOMI.  No discharge. Cardiovascular: No JVD. Respiratory: Normal effort. GI: Non-distended. GU: Foley in place Musc: Bilateral lower extremity and scrotal edema, improving Neurological: Alert and oriented Motor: Bilateral upper extremities 4-4+/5, stable RLE: 4-4+/5 proximal to distal, stable Left lower extremity: Hip flexion, knee extension 4+/5, stable Skin: Skin iswarm. He isnot diaphoretic. Left BKA healed.  RLE wound with vascular changes and scattered ulcers on anterior surface Psychiatric: Normal mood.  Normal affect.  Assessment/Plan: 1. Functional deficits secondary to debility which require 3+ hours per day of interdisciplinary therapy in a comprehensive inpatient rehab setting.  Physiatrist is providing close team supervision and 24 hour management of  active medical problems listed below.  Physiatrist and rehab team continue to assess barriers to discharge/monitor patient progress toward functional and medical goals  Care Tool:  Bathing    Body parts bathed by patient: Right arm, Left arm, Chest, Abdomen, Face   Body parts bathed by helper: Front perineal area, Buttocks, Right upper leg, Left upper leg, Right lower leg Body parts n/a: Left lower leg   Bathing assist Assist Level: Maximal Assistance - Patient 24 - 49%     Upper Body Dressing/Undressing Upper body dressing Upper body dressing/undressing activity did not occur (including orthotics): N/A(patient already had a gown) What is the patient wearing?: Hospital gown only    Upper body assist Assist Level: Total Assistance - Patient < 25%    Lower Body Dressing/Undressing Lower body dressing    Lower body dressing activity did not occur: Refused What is the patient wearing?: Hospital gown only     Lower body assist Assist for lower body dressing: Total Assistance - Patient < 25%     Toileting Toileting Toileting Activity did not occur Landscape architect and hygiene only): Safety/medical concerns  Toileting assist Assist for toileting: Dependent - Patient 0%     Transfers Chair/bed transfer  Transfers assist  Chair/bed transfer activity did not occur: Safety/medical concerns  Chair/bed transfer assist level: Moderate Assistance - Patient 50 - 74%     Locomotion Ambulation   Ambulation assist   Ambulation activity did not occur: Safety/medical concerns(unable due to pain and scrotal and bladder issues)          Walk  10 feet activity   Assist  Walk 10 feet activity did not occur: Safety/medical concerns(unable due to pain and scrotal and bladder issues)        Walk 50 feet activity   Assist Walk 50 feet with 2 turns activity did not occur: Safety/medical concerns(unable due to pain and scrotal and bladder issues)         Walk 150 feet  activity   Assist Walk 150 feet activity did not occur: Safety/medical concerns(unable due to pain and scrotal and bladder issues)         Walk 10 feet on uneven surface  activity   Assist Walk 10 feet on uneven surfaces activity did not occur: Safety/medical concerns(unable due to pain and scrotal and bladder issues)         Wheelchair     Assist   Type of Wheelchair: Manual Wheelchair activity did not occur: Safety/medical concerns(unable due to pain and scrotal and bladder issues)  Wheelchair assist level: Supervision/Verbal cueing Max wheelchair distance: 25'    Wheelchair 50 feet with 2 turns activity    Assist    Wheelchair 50 feet with 2 turns activity did not occur: Safety/medical concerns(unable due to pain and scrotal and bladder issues)       Wheelchair 150 feet activity     Assist Wheelchair 150 feet activity did not occur: Safety/medical concerns(unable due to pain and scrotal and bladder issues)        Medical Problem List and Plan: 1.Debilitysecondary to end-stage renal disease/multi-medical. Hemodialysis recently initiated.  Continue CIR 2. Antithrombotics: -DVT/anticoagulation:Subcutaneous heparin -antiplatelet therapy: N/A 3. Pain Management:Neurontin 300 mg nightly, Ultram as needed  Stable with medications on 8/2. 4. Mood:Celexa 40 mg daily -antipsychotic agents: N/A 5. Neuropsych: This patientiscapable of making decisions on hisown behalf.  Discussed with neuropsychology-appreciate recs, no SI 6. Skin/Wound Care:Scrotal sling, encourage compliance -local care to bullae, foam dressings 7. Fluids/Electrolytes/Nutrition:Routine in and outs 8. ESRD. Status post AV fistula 01/09/2019. Hemodialysis directed. -continue to diurese with HD, recs per nephro Filed Weights   02/18/19 1305 02/18/19 1715 02/19/19 0514  Weight: (!) 141.3 kg 134.9 kg (!) 136.2 kg   ?   Reliability on 8/2 9. Diabetes mellitus. Recent hemoglobin A1c 5.0.   Mildly elevated on 8/1  Continue to monitor 10. Anemia of chronic disease. Continue Aranesp  Hemoglobin 7.5 on 8/1, recs per nephro, labs with HD 11. History of left BKA 5 years ago in [place].   12. Hypertension. Norvasc 10 mg daily.    Relatively stable on 8/2 13. Morbid obesity. BMI 43.78. Dietary follow-up 14. Hyperlipidemia. Lipitor 15. History of syphilis. Treated outpatient with penicillin to the healthcare department recently. Outpatient follow-up 16. Urine retention:   Urology finally was able to pass 12 Fr coude cath   Foley to be left in place per urology, discussed with urology, plan for voiding trial on tomorrow if patient more mobile and less edematous  UA equivocal, urine culture with insignificant growth 17.  Sleep disturbance  Melatonin 4.5 mg nightly    Unable to tolerate CPAP 18.  Leukocytosis  WBCs 12.7 on 8/1, labs with HD  Afebrile  Continue to monitor 19.  Constipation  Bowel meds increased on 7/28, decreased on 7/31  Improving  LOS: 11 days A FACE TO FACE EVALUATION WAS PERFORMED  Ipek Westra Lorie Phenix 02/19/2019, 12:51 PM

## 2019-02-19 NOTE — Progress Notes (Signed)
Occupational Therapy Session Note  Patient Details  Name: [name] MRN: [id number] Date of Birth: 1971-03-31  Today's Date: 02/19/2019 OT Individual Time: 1500-1530 OT Individual Time Calculation (min): 30 min    Short Term Goals: Week 2:  OT Short Term Goal 1 (Week 2): Pt will tolerate sitting EOB with MIN A for sitting balance for 10 min during functional activity OT Short Term Goal 2 (Week 2): Pt will transfer to [facility] with MOD A of 1 caregiver to decrease caregiver burden OT Short Term Goal 3 (Week 2): Pt will don sock with AE PRN and min VC for technique OT Short Term Goal 4 (Week 2): Pt will bathe with MIN A  Skilled Therapeutic Interventions/Progress Updates:    Pt received at end of bathing session with RN and NT.Discussed scrotal swelling and need for sling. Initially donned small sling with thin strap but this sling did not provide enough support and NT reported it left indentation on scrotum yesterday. Obtained more flexible scrotal sling and it was donned. Edu pt on use and indication. Pt completed bed mobility to EOB, demonstrating unsafe behaviors, grabbing at therapists back to stedy/pull. Pt edu on keeping therapist safe during transfers. Pt able to sit EOB for 3-4 minutes with min A before requesting to return supine. Pt brushed teeth supine with set up assistance. Pt left supine with all needs met, bed alarm set.   Therapy Documentation Precautions:  Precautions Precautions: Fall, Other (comment) Precaution Comments: L BKA with prosthesis; significant scrotal edema Restrictions Weight Bearing Restrictions: No   Therapy/Group: Individual Therapy  Curtis Sites 02/19/2019, 4:11 PM

## 2019-02-19 NOTE — Plan of Care (Signed)
  Problem: Consults Goal: RH GENERAL PATIENT EDUCATION Description: See Patient Education module for education specifics. Outcome: Progressing   Problem: RH SKIN INTEGRITY Goal: RH STG SKIN FREE OF INFECTION/BREAKDOWN Description: Min assist Outcome: Progressing Goal: RH STG MAINTAIN SKIN INTEGRITY WITH ASSISTANCE Description: STG Maintain Skin Integrity With min Assistance. Outcome: Progressing   Problem: RH SAFETY Goal: RH STG ADHERE TO SAFETY PRECAUTIONS W/ASSISTANCE/DEVICE Description: STG Adhere to Safety Precautions With min Assistance/Device. Outcome: Progressing

## 2019-02-19 NOTE — Progress Notes (Signed)
[name], male   DOB: 04-06-1971, 48 y.o.   MRN: [id number]  [facility] Progress Note   Assessment/ Plan:   1.  Physical debilitation/deconditioning following hospitalization resulting in initiation of hemodialysis: Ongoing physical therapy/Occupational Therapy with inpatient rehabilitation, continues to require coaxing. 2. ESRD: New start to hemodialysis, continue dialysis on a TTS schedule via right IJ TDC as his left first stage BBF matures (created 01/09/2019 by Dr. Donnetta Hutching). Plans noted for removal of Foley catheter tomorrow per urology.  He does not have any acute indications for dialysis today. 3. Anemia: Hemoglobin/hematocrit appear stable status post PRBC without overt loss, will continue to adjust ESA dosing and give PRBCs when hemoglobin is <7.0. 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  Do not use phosphate/magnesium based laxatives or enemas. 5. Nutrition: Continue renal diet with renal multivitamin.  6.  Chronic pain: Management per PM&R service.  Subjective:   Reports an uneventful dialysis and slept well overnight without CPAP (dissatisfied with mask)..   Objective:   BP 138/78 (BP Location: Right Arm)   Pulse 87   Temp 99.2 F (37.3 C)   Resp 20   Ht 5\' 10"  (1.778 m)   Wt (!) 136.2 kg   SpO2 95%   BMI 43.08 kg/m   Physical Exam: Gen: Resting in bed, watching videos on laptop CVS: Pulse regular rhythm, normal rate, S1 and S2 normal Resp: Anteriorly clear to auscultation, no rales/rhonchi Abd: Soft, obese, nontender Ext: Improving scrotal/thigh edema.  Status post left BKA.  Left BBT with audible bruit  Labs: BMET Recent Labs  Lab 02/14/19 1946 02/16/19 1405 02/18/19 1332  NA 132* 126* 126*  K 3.8 4.0 4.1  CL 96* 91* 91*  CO2 27 23 25   GLUCOSE 120* 143* 124*  BUN 22* 41* 43*  CREATININE 4.66* 6.76* 6.68*  CALCIUM 7.9* 7.7* 7.9*  PHOS 2.4* 3.1 3.2    CBC Recent Labs  Lab 02/14/19 1946 02/16/19 1400 02/18/19 1332  WBC 13.8* 12.4* 12.7*  HGB 7.9* 7.9* 7.5*  HCT 25.6* 26.1* 24.4*  MCV 93.8 96.0 94.9  PLT 283 369 387   Medications:    . sodium chloride   Intravenous Once  . amLODipine  5 mg Oral Daily  . atorvastatin  40 mg Oral Daily  . calcitRIOL  0.25 mcg Oral Daily  . calcium carbonate  1 tablet Oral BID WC  . citalopram  40 mg Oral Daily  . collagenase   Topical Daily  . darbepoetin (ARANESP) injection - DIALYSIS  150 mcg Intravenous Q Tue-HD  . gabapentin  300 mg Oral QHS  . heparin  5,000 Units Subcutaneous Q8H  . Melatonin  4.5 mg Oral QHS  . pantoprazole  40 mg Oral Daily  . polyethylene glycol  17 g Oral Daily  . senna-docusate  1 tablet Oral QHS   Elmarie Shiley, MD 02/19/2019, 9:58 AM

## 2019-02-19 NOTE — Progress Notes (Signed)
Slept well throughout the night. Medicated x2 with ultram for c/o right leg and scrotal pain-partially effective. CHG bath completed. Continues on IV ABT for UTI, no adverse effects noted. Refused CPAP at hs, no respiratory distress noted, O2 sats maintained above 90% on room air.

## 2019-02-20 ENCOUNTER — Inpatient Hospital Stay (HOSPITAL_COMMUNITY): Payer: Medicare Other

## 2019-02-20 ENCOUNTER — Inpatient Hospital Stay (HOSPITAL_COMMUNITY): Payer: Medicare Other | Admitting: Occupational Therapy

## 2019-02-20 ENCOUNTER — Inpatient Hospital Stay (HOSPITAL_COMMUNITY): Payer: Medicare Other | Admitting: Physical Therapy

## 2019-02-20 DIAGNOSIS — I70232 Atherosclerosis of native arteries of right leg with ulceration of calf: Secondary | ICD-10-CM

## 2019-02-20 DIAGNOSIS — N5082 Scrotal pain: Secondary | ICD-10-CM

## 2019-02-20 LAB — CBC
HCT: 24.6 % — ABNORMAL LOW (ref 39.0–52.0)
Hemoglobin: 7.3 g/dL — ABNORMAL LOW (ref 13.0–17.0)
MCH: 28.9 pg (ref 26.0–34.0)
MCHC: 29.7 g/dL — ABNORMAL LOW (ref 30.0–36.0)
MCV: 97.2 fL (ref 80.0–100.0)
Platelets: 435 10*3/uL — ABNORMAL HIGH (ref 150–400)
RBC: 2.53 MIL/uL — ABNORMAL LOW (ref 4.22–5.81)
RDW: 13.4 % (ref 11.5–15.5)
WBC: 12.7 10*3/uL — ABNORMAL HIGH (ref 4.0–10.5)
nRBC: 0 % (ref 0.0–0.2)

## 2019-02-20 LAB — RENAL FUNCTION PANEL
Albumin: 1.5 g/dL — ABNORMAL LOW (ref 3.5–5.0)
Anion gap: 12 (ref 5–15)
BUN: 42 mg/dL — ABNORMAL HIGH (ref 6–20)
CO2: 23 mmol/L (ref 22–32)
Calcium: 7.7 mg/dL — ABNORMAL LOW (ref 8.9–10.3)
Chloride: 91 mmol/L — ABNORMAL LOW (ref 98–111)
Creatinine, Ser: 6.94 mg/dL — ABNORMAL HIGH (ref 0.61–1.24)
GFR calc Af Amer: 10 mL/min — ABNORMAL LOW (ref >60)
GFR calc non Af Amer: 9 mL/min — ABNORMAL LOW (ref >60)
Glucose, Bld: 140 mg/dL — ABNORMAL HIGH (ref 70–99)
Phosphorus: 4 mg/dL (ref 2.5–4.6)
Potassium: 4 mmol/L (ref 3.5–5.1)
Sodium: 126 mmol/L — ABNORMAL LOW (ref 135–145)

## 2019-02-20 MED ORDER — HEPARIN SODIUM (PORCINE) 1000 UNIT/ML IJ SOLN
INTRAMUSCULAR | Status: AC
  Filled 2019-02-20: qty 4

## 2019-02-20 MED ORDER — CHLORHEXIDINE GLUCONATE CLOTH 2 % EX PADS
6.0000 | MEDICATED_PAD | Freq: Every day | CUTANEOUS | Status: DC
  Administered 2019-02-20: 6 via TOPICAL

## 2019-02-20 MED ORDER — HEPARIN SODIUM (PORCINE) 1000 UNIT/ML IJ SOLN
INTRAMUSCULAR | Status: AC
  Administered 2019-02-20: 2800 [IU] via INTRAVENOUS_CENTRAL
  Filled 2019-02-20: qty 3

## 2019-02-20 MED ORDER — HEPARIN SODIUM (PORCINE) 1000 UNIT/ML DIALYSIS
20.0000 [IU]/kg | INTRAMUSCULAR | Status: DC | PRN
  Administered 2019-02-20: 15:00:00 2800 [IU] via INTRAVENOUS_CENTRAL
  Filled 2019-02-20: qty 2.8

## 2019-02-20 NOTE — Progress Notes (Signed)
Physical Therapy Session Note  Patient Details  Name: Michael Riggs MRN: [id number] Date of Birth: July 25, 1970  Today's Date: 02/20/2019  Short Term Goals: Week 2:  PT Short Term Goal 1 (Week 2): Pt will perform bed<>chair transfer w/ mod assist PT Short Term Goal 2 (Week 2): Pt will perform bed mobility w/ mod assist PT Short Term Goal 3 (Week 2): Pt will participate in 60 min of activity  Skilled Therapeutic Interventions/Progress Updates:  Pt received in bed, with therapist encouraging pt to get OOB to w/c or sit EOB as pt reports he completed upper body, lower body, and core exercises during prior session. Pt reluctantly willing to transfer to sitting EOB and upon being uncovered therapist observed pt was incontinent of large BM. Pt left in care of NT for hygiene purposes. Pt missed 30 minutes of skilled PT treatment, will f/u per POC.   Therapy Documentation Precautions:  Precautions Precautions: Fall, Other (comment) Precaution Comments: L BKA with prosthesis; significant scrotal edema Restrictions Weight Bearing Restrictions: No General: PT Amount of Missed Time (min): 30 Minutes PT Missed Treatment Reason: Bowel/bladder accident   Therapy/Group: Individual Therapy  [name] 02/20/2019, 10:09 AM

## 2019-02-20 NOTE — Procedures (Signed)
Patient was seen on dialysis and the procedure was supervised.  BFR 350  Via TDC BP is  158/92.   Patient appears to be tolerating treatment well- have pulled over 4 already   [employer] [facility] 02/20/2019

## 2019-02-20 NOTE — Progress Notes (Signed)
Occupational Therapy Session Note  Patient Details  Name: Michael Riggs MRN: [id number] Date of Birth: 1970-08-22  Today's Date: 02/20/2019 OT Individual Time: 4920-1007 OT Individual Time Calculation (min): 34 min    Short Term Goals: Week 2:  OT Short Term Goal 1 (Week 2): Pt will tolerate sitting EOB with MIN A for sitting balance for 10 min during functional activity OT Short Term Goal 2 (Week 2): Pt will transfer to [facility] with MOD A of 1 caregiver to decrease caregiver burden OT Short Term Goal 3 (Week 2): Pt will don sock with AE PRN and min VC for technique OT Short Term Goal 4 (Week 2): Pt will bathe with MIN A  Skilled Therapeutic Interventions/Progress Updates:    Patient supine in bed, alert, declines bathing or dressing.  Declines transfer to w/c as he is unable to tolerate sitting OOB.  Completed repositioning in bed, UB/LB conditioning activities, core strength and mobility exercises with focus on increasing upright/forward lean/tolerance.  Patient remained in bed at close of session with bed alarm set and encouraged to continue with UB conditioning activities t/o the day.  Therapy Documentation Precautions:  Precautions Precautions: Fall, Other (comment) Precaution Comments: L BKA with prosthesis; significant scrotal edema Restrictions Weight Bearing Restrictions: No General:   Vital Signs: Therapy Vitals Temp: 98.9 F (37.2 C) Temp Source: Oral Pulse Rate: 79 Resp: 20 BP: 127/81 Patient Position (if appropriate): Sitting Oxygen Therapy SpO2: 98 % O2 Device: Room Air Pain: Pain Assessment Pain Score: 3  Pain Location: Scrotum Pain Descriptors / Indicators: Constant Pain Intervention(s): Repositioned   Other Treatments:     Therapy/Group: Individual Therapy  Carlos Levering 02/20/2019, 8:24 AM

## 2019-02-20 NOTE — Consult Note (Addendum)
Hospital Consult    Reason for Consult:  Non healing wounds right leg Requesting Physician:  Love MRN #:  [id number]  History of Present Illness: This is a 48 y.o. male who was admitted 11 days ago with confusion.  His cxr revealed early congestion.  He also had complaints of not being able to void.  He has hx of CKD and recently underwent 1st stage BVT on the left by Dr. Donnetta Hutching on 01/09/2019.   He denies any pain in his left hand.   He underwent placement of TDC by IR on 02/03/2019.  VVS is consulted for RLE wounds.  Pt states he had some blisters on his right lower leg.  The day before he had his access surgery, he states he was in his wheelchair and went to stand and fell.  His sister said to him that he was bleeding everywhere.   His wounds have worsened.  He has hx of left BKA about 5 years ago.  He states that he gets around with wheelchair and does have a prosthesis.    The pt is  on a statin for cholesterol management.  The pt is not on a daily aspirin.   Other AC:  Sq heparin The pt was on CCB for hypertension PTA The pt is diabetic.  On insulin Tobacco hx:  Recently quit   Past Medical History:  Diagnosis Date  . Anemia   . Chronic kidney disease    Stage 5  . Depression   . Diabetes mellitus without complication ([place])   . Dyspnea   . GERD (gastroesophageal reflux disease)   . Hypertension     Past Surgical History:  Procedure Laterality Date  . AV FISTULA PLACEMENT Left 01/09/2019   Procedure: ARTERIOVENOUS (AV) FISTULA CREATION LEFT UPPER ARM;  Surgeon: [name], MD;  Location: [facility] OR;  Service: Vascular;  Laterality: Left;  . below the knee amputation Left   . EYE SURGERY    . IR FLUORO GUIDE CV LINE RIGHT  01/31/2019  . IR FLUORO GUIDE CV LINE RIGHT  02/03/2019  . IR US GUIDE VASC ACCESS RIGHT  01/31/2019  . IR US GUIDE VASC ACCESS RIGHT  02/03/2019    No Known Allergies  Prior to Admission medications   Medication Sig Start Date End Date Taking? Authorizing  Provider  albuterol (PROVENTIL) (2.5 MG/3ML) 0.083% nebulizer solution Inhale 3 mLs into the lungs every 6 (six) hours as needed for wheezing or shortness of breath. 02/08/19   Alma Friendly, MD  amLODipine (NORVASC) 10 MG tablet Take 10 mg by mouth daily.    [provider]  atorvastatin (LIPITOR) 40 MG tablet Take 40 mg by mouth daily.  07/19/18   [provider]  calcitRIOL (ROCALTROL) 0.25 MCG capsule Take 1 capsule (0.25 mcg total) by mouth daily. 02/09/19   Alma Friendly, MD  calcium carbonate (TUMS - DOSED IN MG ELEMENTAL CALCIUM) 500 MG chewable tablet Chew 1 tablet (200 mg of elemental calcium total) by mouth 2 (two) times daily with a meal. 02/09/19   Alma Friendly, MD  Chlorhexidine Gluconate Cloth 2 % PADS Apply 6 each topically daily at 6 (six) AM. 02/09/19   Alma Friendly, MD  citalopram (CELEXA) 40 MG tablet Take 40 mg by mouth daily.  12/13/16   [provider]  collagenase (SANTYL) ointment Apply topically daily. 02/09/19   Alma Friendly, MD  Darbepoetin Alfa (ARANESP) 100 MCG/0.5ML SOSY injection Inject 0.5 mLs (100 mcg  total) into the vein every Tuesday with hemodialysis. 02/14/19   Alma Friendly, MD  ferric gluconate 62.5 mg in sodium chloride 0.9 % 100 mL Inject 62.5 mg into the vein Every Tuesday,Thursday,and Saturday with dialysis. 02/09/19   Alma Friendly, MD  gabapentin (NEURONTIN) 300 MG capsule Take 1 capsule (300 mg total) by mouth at bedtime. 02/08/19   Alma Friendly, MD  heparin 5000 UNIT/ML injection Inject 1 mL (5,000 Units total) into the skin every 8 (eight) hours. 02/08/19   Alma Friendly, MD  hydrOXYzine (ATARAX/VISTARIL) 25 MG tablet Take 25 mg by mouth every 8 (eight) hours as needed for anxiety.  04/26/18   [provider]  insulin aspart (NOVOLOG) 100 UNIT/ML injection Inject 0-9 Units into the skin 3 (three) times daily with meals. 02/09/19   Alma Friendly, MD  insulin  aspart (NOVOLOG) 100 UNIT/ML injection Inject 0-5 Units into the skin at bedtime. 02/08/19   Alma Friendly, MD  Melatonin 3 MG TABS Take 1.5 tablets (4.5 mg total) by mouth at bedtime. 02/08/19   Alma Friendly, MD  ondansetron (ZOFRAN) 4 MG tablet Take 4 mg by mouth daily as needed for nausea or vomiting.  07/19/18   [provider]  pantoprazole (PROTONIX) 40 MG tablet Take 1 tablet (40 mg total) by mouth daily. 02/09/19   Alma Friendly, MD  polyethylene glycol (MIRALAX / GLYCOLAX) 17 g packet Take 17 g by mouth daily. 02/09/19   Alma Friendly, MD  senna-docusate (SENOKOT-S) 8.6-50 MG tablet Take 1 tablet by mouth 2 (two) times daily. 02/08/19   Alma Friendly, MD  traMADol (ULTRAM) 50 MG tablet Take by mouth every 6 (six) hours as needed.    [provider]    Social History   Socioeconomic History  . Marital status: Divorced    Spouse name: Not on file  . Number of children: Not on file  . Years of education: Not on file  . Highest education level: Not on file  Occupational History  . Not on file  Social Needs  . Financial resource strain: Not on file  . Food insecurity    Worry: Not on file    Inability: Not on file  . Transportation needs    Medical: Not on file    Non-medical: Not on file  Tobacco Use  . Smoking status: Former Smoker    Types: Cigarettes    Start date: 11/18/2018    Quit date: 01/18/2019    Years since quitting: 0.0  . Smokeless tobacco: Never Used  . Tobacco comment: pt stated got bored with it  Substance and Sexual Activity  . Alcohol use: Not Currently    Comment: heavy drinker in the past, none since 07/19/18  . Drug use: Not Currently  . Sexual activity: Not Currently  Lifestyle  . Physical activity    Days per week: Not on file    Minutes per session: Not on file  . Stress: Not on file  Relationships  . Social Herbalist on phone: Not on file    Gets together: Not on file    Attends  religious service: Not on file    Active member of club or organization: Not on file    Attends meetings of clubs or organizations: Not on file    Relationship status: Not on file  . Intimate partner violence    Fear of current or ex partner: Not on file  Emotionally abused: Not on file    Physically abused: Not on file    Forced sexual activity: Not on file  Other Topics Concern  . Not on file  Social History Narrative  . Not on file     ROS: [x]  Positive   [ ]  Negative   [ ]  All sytems reviewed and are negative Cardiac: [x]  SOB [x]  high blood pressure  Vascular: [x]  non-healing ulcers  Pulmonary: []  productive cough []  asthma/wheezing []  home O2  Neurologic: []  hx of CVA []  mini stroke  Hematologic: [x]  anemia  Endocrine:   [x]  diabetes []  thyroid disease  GI [x]  GERD   GU: [x]  CKD/renal failure [x]  HD--[]  M/W/F or []  T/T/S  Psychiatric: [x]  depression  Musculoskeletal: []  arthritis []  joint pain  Integumentary: []  rashes []  ulcers  Constitutional: [x]  confusion   Physical Examination  Vitals:   02/19/19 1941 02/20/19 0526  BP: 132/82 127/81  Pulse: 85 79  Resp: 19 20  Temp: 99.5 F (37.5 C) 98.9 F (37.2 C)  SpO2: 97% 98%   Body mass index is 43.53 kg/m.  General:  WDWN in NAD seen on HD Gait: Not observed HENT: WNL, normocephalic Pulmonary: normal non-labored breathing Cardiac: regular2 Abdomen:  soft, NT/ND, no masses Skin: without rashes Vascular Exam/Pulses:  Right Left  Radial 2+ (normal) Unable to palpate   DP 2+ (normal) BKA  PT Unable to palpate  BKA   Extremities: discoloration of right leg below the knee with area with some eschar; well healed left BKA; excellent thrill in left arm fistula Musculoskeletal: no muscle wasting or atrophy  Neurologic: A&O X 3;  No focal weakness or paresthesias are detected; speech is fluent/normal Psychiatric:  The pt has Normal affect.  CBC    Component Value Date/Time   WBC  12.7 (H) 02/18/2019 1332   RBC 2.57 (L) 02/18/2019 1332   HGB 7.5 (L) 02/18/2019 1332   HCT 24.4 (L) 02/18/2019 1332   PLT 387 02/18/2019 1332   MCV 94.9 02/18/2019 1332   MCH 29.2 02/18/2019 1332   MCHC 30.7 02/18/2019 1332   RDW 13.2 02/18/2019 1332   LYMPHSABS 1.4 02/09/2019 1500   MONOABS 1.6 (H) 02/09/2019 1500   EOSABS 0.8 (H) 02/09/2019 1500   BASOSABS 0.1 02/09/2019 1500    BMET    Component Value Date/Time   NA 126 (L) 02/18/2019 1332   K 4.1 02/18/2019 1332   CL 91 (L) 02/18/2019 1332   CO2 25 02/18/2019 1332   GLUCOSE 124 (H) 02/18/2019 1332   BUN 43 (H) 02/18/2019 1332   CREATININE 6.68 (H) 02/18/2019 1332   CALCIUM 7.9 (L) 02/18/2019 1332   GFRNONAA 9 (L) 02/18/2019 1332   GFRAA 10 (L) 02/18/2019 1332    COAGS: Lab Results  Component Value Date   INR 1.4 (H) 01/31/2019     Non-Invasive Vascular Imaging:   CT abdomen/pelvis 02/11/2019: IMPRESSION: 1. There is a spectrum of findings, compatible with a state of anasarca, including bilateral pleural effusions, diffuse body wall edema, scrotal edema and trace volume of ascites. 2. No acute findings are noted in the abdomen or pelvis. Small amount of tiny calcified gallstones lying dependently in the gallbladder. No findings to suggest an acute cholecystitis at this time. 3. Normal appendix. 4. Aortic atherosclerosis. 5. Additional incidental findings, as above.   ASSESSMENT/PLAN: This is a 48 y.o. male with ESRD who now has non healing wounds on RLE  -pt has easily palpable right DP pulse.  PT may  have small vessel disease due to his diabetes.  He may need arteriogram to evaluate arterial anatomy.  Dr. Oneida Alar to see pt later today or tomorrow. -pt 1st stage left BVT with excellent thrill.  Will need appointment to f/u in clinic to evaluate maturity of fistula for 2nd stage BVT.  No evidence of steal sx as motor and sensory are in tact and he does not have pain in left hand.    Leontine Locket,  PA-C [facility] [phone]  Agree with above  Ruta Hinds, MD [facility] [place] Office: [phone] Pager: [phone]

## 2019-02-20 NOTE — Consult Note (Signed)
Referring Physician:Dr Posey Pronto  Patient name: Michael Riggs MRN: [id number] DOB: 1971-05-10 Sex: male  REASON FOR CONSULT: right leg wounds  HPI: Michael Riggs is a 48 y.o. male, wityh a several month history of painful right leg ulcers.  He had prior toe infection in left leg that eventually resulted in amputation of left leg.  Other medical problems include ESRD, diabetes hypertension, obesity.  Past Medical History:  Diagnosis Date  . Anemia   . Chronic kidney disease    Stage 5  . Depression   . Diabetes mellitus without complication ([place])   . Dyspnea   . GERD (gastroesophageal reflux disease)   . Hypertension    Past Surgical History:  Procedure Laterality Date  . AV FISTULA PLACEMENT Left 01/09/2019   Procedure: ARTERIOVENOUS (AV) FISTULA CREATION LEFT UPPER ARM;  Surgeon: [name], MD;  Location: [facility];  Service: Vascular;  Laterality: Left;  . below the knee amputation Left   . EYE SURGERY    . IR FLUORO GUIDE CV LINE RIGHT  01/31/2019  . IR FLUORO GUIDE CV LINE RIGHT  02/03/2019  . IR US GUIDE VASC ACCESS RIGHT  01/31/2019  . IR US GUIDE VASC ACCESS RIGHT  02/03/2019    History reviewed. No pertinent family history.  SOCIAL HISTORY: Social History   Socioeconomic History  . Marital status: Divorced    Spouse name: Not on file  . Number of children: Not on file  . Years of education: Not on file  . Highest education level: Not on file  Occupational History  . Not on file  Social Needs  . Financial resource strain: Not on file  . Food insecurity    Worry: Not on file    Inability: Not on file  . Transportation needs    Medical: Not on file    Non-medical: Not on file  Tobacco Use  . Smoking status: Former Smoker    Types: Cigarettes    Start date: 11/18/2018    Quit date: 01/18/2019    Years since quitting: 0.0  . Smokeless tobacco: Never Used  . Tobacco comment: pt stated got bored with it  Substance and Sexual Activity  . Alcohol  use: Not Currently    Comment: heavy drinker in the past, none since 07/19/18  . Drug use: Not Currently  . Sexual activity: Not Currently  Lifestyle  . Physical activity    Days per week: Not on file    Minutes per session: Not on file  . Stress: Not on file  Relationships  . Social Herbalist on phone: Not on file    Gets together: Not on file    Attends religious service: Not on file    Active member of club or organization: Not on file    Attends meetings of clubs or organizations: Not on file    Relationship status: Not on file  . Intimate partner violence    Fear of current or ex partner: Not on file    Emotionally abused: Not on file    Physically abused: Not on file    Forced sexual activity: Not on file  Other Topics Concern  . Not on file  Social History Narrative  . Not on file    No Known Allergies  Current Facility-Administered Medications  Medication Dose Route Frequency Provider Last Rate Last Dose  . 0.9 %  sodium chloride infusion (Manually program via Guardrails IV Fluids)   Intravenous  Once Elmarie Shiley, MD      . acetaminophen (TYLENOL) tablet 650 mg  650 mg Oral Q6H PRN Cathlyn Parsons, PA-C   650 mg at 02/20/19 7209   Or  . acetaminophen (TYLENOL) suppository 650 mg  650 mg Rectal Q6H PRN Angiulli, Lavon Paganini, PA-C      . albuterol (PROVENTIL) (2.5 MG/3ML) 0.083% nebulizer solution 3 mL  3 mL Inhalation Q6H PRN Angiulli, Lavon Paganini, PA-C      . atorvastatin (LIPITOR) tablet 40 mg  40 mg Oral Daily Cathlyn Parsons, PA-C   40 mg at 02/20/19 0810  . bisacodyl (DULCOLAX) suppository 10 mg  10 mg Rectal Daily PRN Bayard Hugger, NP   10 mg at 02/14/19 0837  . calcitRIOL (ROCALTROL) capsule 0.25 mcg  0.25 mcg Oral Daily Cathlyn Parsons, PA-C   0.25 mcg at 02/20/19 0810  . calcium carbonate (TUMS - dosed in mg elemental calcium) chewable tablet 200 mg of elemental calcium  1 tablet Oral BID WC Angiulli, Daniel J, PA-C   200 mg of elemental calcium  at 02/20/19 0810  . cefTRIAXone (ROCEPHIN) 1 g in sodium chloride 0.9 % 100 mL IVPB  1 g Intravenous Q24H Alger Simons T, MD 200 mL/hr at 02/19/19 2110 1 g at 02/19/19 2110  . Chlorhexidine Gluconate Cloth 2 % PADS 6 each  6 each Topical Q0600 Corliss Parish, MD      . Chlorhexidine Gluconate Cloth 2 % PADS 6 each  6 each Topical Q0600 Corliss Parish, MD      . citalopram (CELEXA) tablet 40 mg  40 mg Oral Daily Cathlyn Parsons, PA-C   40 mg at 02/20/19 0810  . collagenase (SANTYL) ointment   Topical Daily Angiulli, Lavon Paganini, PA-C      . Darbepoetin Alfa (ARANESP) injection 150 mcg  150 mcg Intravenous Q Jayme Cloud, MD   150 mcg at 02/14/19 1816  . gabapentin (NEURONTIN) capsule 300 mg  300 mg Oral QHS AngiulliLavon Paganini, PA-C   300 mg at 02/19/19 2117  . heparin 1000 UNIT/ML injection           . heparin injection 2,800 Units  20 Units/kg Dialysis PRN Corliss Parish, MD      . heparin injection 5,000 Units  5,000 Units Subcutaneous Q8H Cathlyn Parsons, PA-C   5,000 Units at 02/20/19 4709  . hydrOXYzine (ATARAX/VISTARIL) tablet 25 mg  25 mg Oral Q8H PRN Cathlyn Parsons, PA-C      . Melatonin TABS 4.5 mg  4.5 mg Oral QHS AngiulliLavon Paganini, PA-C   4.5 mg at 02/19/19 2116  . pantoprazole (PROTONIX) EC tablet 40 mg  40 mg Oral Daily Cathlyn Parsons, PA-C   40 mg at 02/20/19 0810  . polyethylene glycol (MIRALAX / GLYCOLAX) packet 17 g  17 g Oral Daily Jamse Arn, MD   17 g at 02/20/19 0810  . senna-docusate (Senokot-S) tablet 1 tablet  1 tablet Oral QHS Jamse Arn, MD   1 tablet at 02/19/19 2115  . simethicone (MYLICON) chewable tablet 80 mg  80 mg Oral Q6H PRN Rexene Agent, MD   80 mg at 02/12/19 2139  . sorbitol 70 % solution 30 mL  30 mL Oral Daily PRN Cathlyn Parsons, PA-C   30 mL at 02/13/19 2044  . traMADol (ULTRAM) tablet 50 mg  50 mg Oral Q6H PRN Cathlyn Parsons, PA-C   50 mg at 02/20/19 1056  ROS:   General:  No weight loss,  Fever, chills  Cardiac: No recent episodes of chest pain/pressure, no shortness of breath at rest.  +shortness of breath with exertion.  Denies history of atrial fibrillation or irregular heartbeat  Vascular: No history of rest pain in feet.  No history of claudication.  + history of non-healing ulcer, No history of DVT   Pulmonary: No home oxygen, no productive cough, no hemoptysis,  No asthma or wheezing   Physical Examination  Vitals:   02/20/19 1315 02/20/19 1320 02/20/19 1330 02/20/19 1430  BP: 136/74 130/74 112/64 131/71  Pulse: 80 81 84 87  Resp: 14     Temp: 98.9 F (37.2 C)     TempSrc: Oral     SpO2: 98%     Weight: (!) 139.3 kg     Height:        Body mass index is 44.06 kg/m.  General:  Alert and oriented, no acute distress Abdomen: obese Skin: large areas of dry black eschar covering most of posterior right calf and anterior calf, foot is spared Extremity Pulses:  2+femoral, dorsalis pedis right leg Musculoskeletal: left BKA Neurologic: Upper and lower extremity motor 5/5 and symmetric  DATA:  CBC    Component Value Date/Time   WBC 12.7 (H) 02/20/2019 1430   RBC 2.53 (L) 02/20/2019 1430   HGB 7.3 (L) 02/20/2019 1430   HCT 24.6 (L) 02/20/2019 1430   PLT 435 (H) 02/20/2019 1430   MCV 97.2 02/20/2019 1430   MCH 28.9 02/20/2019 1430   MCHC 29.7 (L) 02/20/2019 1430   RDW 13.4 02/20/2019 1430   LYMPHSABS 1.4 02/09/2019 1500   MONOABS 1.6 (H) 02/09/2019 1500   EOSABS 0.8 (H) 02/09/2019 1500   BASOSABS 0.1 02/09/2019 1500    BMET    Component Value Date/Time   NA 126 (L) 02/20/2019 1430   K 4.0 02/20/2019 1430   CL 91 (L) 02/20/2019 1430   CO2 23 02/20/2019 1430   GLUCOSE 140 (H) 02/20/2019 1430   BUN 42 (H) 02/20/2019 1430   CREATININE 6.94 (H) 02/20/2019 1430   CALCIUM 7.7 (L) 02/20/2019 1430   GFRNONAA 9 (L) 02/20/2019 1430   GFRAA 10 (L) 02/20/2019 1430     ASSESSMENT:  Pt with necrotic skin ulcer right lateral and posterior leg with pain.   These do not look like typical venous ulcers.  Doubt arterial etiology as he has a palpable DP pulse.   PLAN:  Right leg wounds non vascular in etiology.  Not sure how long he has had renal failure but certainly calciphylaxis could be considered or other diabetes skin manifestations  Would suggest referral to wound care center on d/c   No further vascular intervention at this point   Call if questions   Ruta Hinds, MD [facility] [place]: [phone] Pager: [phone]

## 2019-02-20 NOTE — Progress Notes (Signed)
[name], male   DOB: 11-02-1970, 48 y.o.   MRN: [id number]  [facility] Progress Note   Assessment/ Plan:   1.  Physical debilitation/deconditioning following hospitalization resulting in initiation of hemodialysis: Ongoing physical therapy/Occupational Therapy with inpatient rehabilitation, continues to require coaxing. 2. ESRD: New start to hemodialysis, continue dialysis on a TTS schedule via right IJ TDC as his left first stage BBF matures (created 01/09/2019 by Dr. Donnetta Hutching). Wants to run MTTS this week 3. Anemia: Hemoglobin/hematocrit appear stable status post PRBC without overt loss, will continue to adjust ESA dosing and give PRBCs when hemoglobin is <7.0. 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  Do not use phosphate/magnesium based laxatives or enemas. 5. Nutrition: Continue renal diet with renal multivitamin.  6.  Chronic pain: Management per PM&R service.  Subjective:   Very talkative...Marland KitchenMarland KitchenMarland Kitchen says scrotum is less swollen-  Really wants to run HD today to get this fluid off- consents to run 4 days a week   Objective:   BP 127/81 (BP Location: Right Arm)   Pulse 79   Temp 98.9 F (37.2 C) (Oral)   Resp 20   Ht 5\' 10"  (1.778 m)   Wt (!) 137.6 kg   SpO2 98%   BMI 43.53 kg/m   Physical Exam: Gen: Resting in bed, watching videos on laptop CVS: Pulse regular rhythm, normal rate, S1 and S2 normal Resp: Anteriorly clear to auscultation, no rales/rhonchi Abd: Soft, obese, nontender Ext: Improving scrotal/thigh edema but still there .  Status post left BKA.  Left BBT with audible bruit  Labs: BMET Recent Labs  Lab 02/14/19 1946 02/16/19 1405 02/18/19 1332  NA 132* 126* 126*  K 3.8 4.0 4.1  CL 96* 91* 91*  CO2 27 23 25   GLUCOSE 120* 143* 124*  BUN 22* 41* 43*  CREATININE 4.66* 6.76* 6.68*  CALCIUM 7.9* 7.7* 7.9*  PHOS 2.4* 3.1 3.2   CBC Recent Labs   Lab 02/14/19 1946 02/16/19 1400 02/18/19 1332  WBC 13.8* 12.4* 12.7*  HGB 7.9* 7.9* 7.5*  HCT 25.6* 26.1* 24.4*  MCV 93.8 96.0 94.9  PLT 283 369 387   Medications:    . sodium chloride   Intravenous Once  . amLODipine  5 mg Oral Daily  . atorvastatin  40 mg Oral Daily  . calcitRIOL  0.25 mcg Oral Daily  . calcium carbonate  1 tablet Oral BID WC  . citalopram  40 mg Oral Daily  . collagenase   Topical Daily  . darbepoetin (ARANESP) injection - DIALYSIS  150 mcg Intravenous Q Tue-HD  . gabapentin  300 mg Oral QHS  . heparin  5,000 Units Subcutaneous Q8H  . Melatonin  4.5 mg Oral QHS  . pantoprazole  40 mg Oral Daily  . polyethylene glycol  17 g Oral Daily  . senna-docusate  1 tablet Oral QHS  Cutberto Winfree A Sevanna Ballengee  02/20/2019, 10:57 AM

## 2019-02-20 NOTE — Consult Note (Addendum)
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 I do feel a weak dorsalis pedis and the foot is cool but no other color changes   Wound type: Unclear etiology; reported trauma however I do not think the wounds he presents with today are related to trauma.  More likely related to arterial compromise.  Pressure Injury POA: NA Measurement: Scattered areas over the right lateral calf but one larger area that is evolving to eschar. Unable to measure due to location and patient not able to hold leg up for assistance to [facility] nurse  Wound bed: eschar; the posterior tibia has a small area with yellow base  Drainage (amount, consistency, odor) minimal  Periwound: 1+ edema  Dressing procedure/placement/frequency: Current orders are for enzymatic debridement ointment however I have notified the MD the wounds are worsening and appear to be more extensive.  Consider vascular consultation given the fact the etiology is unclear and the patient has a limb amputation on the left lower extremity.   The penile wound should be evaluated by urologist since they are following this patient  Discussed POC with patient and bedside nurse.  Re consult if needed, will not follow at this time. Thanks  Carlene Bickley [employer], RN,CWOCN, CNS, [place] [phone])

## 2019-02-20 NOTE — Progress Notes (Addendum)
[place] PHYSICAL MEDICINE & REHABILITATION PROGRESS NOTE   Subjective/Complaints: Patient seen laying in bed this morning eating breakfast.  He states he slept well overnight.  He states that he was seen by urology this a.m. and was told to keep Foley in place for another 1 to 2 weeks.  Urology notes reviewed- follow-up in 1 week.  Discussed with patient again how he may be able to track his cell phone.  ROS: + Scrotal edema, stable.  Denies CP, shortness of breath, N/V/D  Objective:   No results found. Recent Labs    02/18/19 1332  WBC 12.7*  HGB 7.5*  HCT 24.4*  PLT 387   Recent Labs    02/18/19 1332  NA 126*  K 4.1  CL 91*  CO2 25  GLUCOSE 124*  BUN 43*  CREATININE 6.68*  CALCIUM 7.9*    Intake/Output Summary (Last 24 hours) at 02/20/2019 0835 Last data filed at 02/20/2019 0527 Gross per 24 hour  Intake 560 ml  Output 250 ml  Net 310 ml     Physical Exam: Vital Signs Blood pressure 127/81, pulse 79, temperature 98.9 F (37.2 C), temperature source Oral, resp. rate 20, height 5\' 10"  (1.778 m), weight (!) 137.6 kg, SpO2 98 %. Constitutional: No distress . Vital signs reviewed.  Morbidly obese. HENT: Normocephalic.  Atraumatic. Eyes: EOMI.  No discharge. Cardiovascular: No JVD. Respiratory: Normal effort. GI: Non-distended. GU: Foley in place Musc: Bilateral lower extremity and scrotal edema, stable Neurological: Alert Motor:  RLE: 4-4+/5 proximal to distal, unchanged Left lower extremity: Hip flexion, knee extension 4+/5, unchanged Skin: Skin iswarm. He isnot diaphoretic. Left BKA healed.  RLE wound with vascular changes and scattered ulcers with dressing Psychiatric: Normal mood.  Normal affect.  Assessment/Plan: 1. Functional deficits secondary to debility which require 3+ hours per day of interdisciplinary therapy in a comprehensive inpatient rehab setting.  Physiatrist is providing close team supervision and 24 hour management of active medical  problems listed below.  Physiatrist and rehab team continue to assess barriers to discharge/monitor patient progress toward functional and medical goals  Care Tool:  Bathing    Body parts bathed by patient: Right arm, Left arm, Chest, Abdomen, Face   Body parts bathed by helper: Front perineal area, Buttocks, Right upper leg, Left upper leg, Right lower leg Body parts n/a: Left lower leg   Bathing assist Assist Level: Maximal Assistance - Patient 24 - 49%     Upper Body Dressing/Undressing Upper body dressing Upper body dressing/undressing activity did not occur (including orthotics): N/A(patient already had a gown) What is the patient wearing?: Hospital gown only    Upper body assist Assist Level: Total Assistance - Patient < 25%    Lower Body Dressing/Undressing Lower body dressing    Lower body dressing activity did not occur: Refused What is the patient wearing?: Hospital gown only     Lower body assist Assist for lower body dressing: Total Assistance - Patient < 25%     Toileting Toileting Toileting Activity did not occur Landscape architect and hygiene only): Safety/medical concerns  Toileting assist Assist for toileting: Dependent - Patient 0%     Transfers Chair/bed transfer  Transfers assist  Chair/bed transfer activity did not occur: Safety/medical concerns  Chair/bed transfer assist level: Moderate Assistance - Patient 50 - 74%     Locomotion Ambulation   Ambulation assist   Ambulation activity did not occur: Safety/medical concerns(unable due to pain and scrotal and bladder issues)  Walk 10 feet activity   Assist  Walk 10 feet activity did not occur: Safety/medical concerns(unable due to pain and scrotal and bladder issues)        Walk 50 feet activity   Assist Walk 50 feet with 2 turns activity did not occur: Safety/medical concerns(unable due to pain and scrotal and bladder issues)         Walk 150 feet  activity   Assist Walk 150 feet activity did not occur: Safety/medical concerns(unable due to pain and scrotal and bladder issues)         Walk 10 feet on uneven surface  activity   Assist Walk 10 feet on uneven surfaces activity did not occur: Safety/medical concerns(unable due to pain and scrotal and bladder issues)         Wheelchair     Assist   Type of Wheelchair: Manual Wheelchair activity did not occur: Safety/medical concerns(unable due to pain and scrotal and bladder issues)  Wheelchair assist level: Supervision/Verbal cueing Max wheelchair distance: 25'    Wheelchair 50 feet with 2 turns activity    Assist    Wheelchair 50 feet with 2 turns activity did not occur: Safety/medical concerns(unable due to pain and scrotal and bladder issues)       Wheelchair 150 feet activity     Assist Wheelchair 150 feet activity did not occur: Safety/medical concerns(unable due to pain and scrotal and bladder issues)        Medical Problem List and Plan: 1.Debilitysecondary to end-stage renal disease/multi-medical. Hemodialysis recently initiated.  Continue CIR 2. Antithrombotics: -DVT/anticoagulation:Subcutaneous heparin -antiplatelet therapy: N/A 3. Pain Management:Neurontin 300 mg nightly, Ultram as needed  Stable with medications on 8/3 4. Mood:Celexa 40 mg daily -antipsychotic agents: N/A 5. Neuropsych: This patientiscapable of making decisions on hisown behalf.  Discussed with neuropsychology-appreciate recs, no SI 6. Skin/Wound Care:Scrotal sling, encourage compliance -local care to bullae, foam dressings 7. Fluids/Electrolytes/Nutrition:Routine in and outs 8.  ESRD:. Status post AV fistula 01/09/2019. Hemodialysis directed. -continue to diurese with HD, recs per nephro Filed Weights   02/18/19 1715 02/19/19 0514 02/20/19 0526  Weight: 134.9 kg (!) 136.2 kg (!) 137.6 kg   ? Trending  up on 8/3 9. Diabetes mellitus. Recent hemoglobin A1c 5.0.   Mildly elevated on 8/1  Continue to monitor 10. Anemia of chronic disease. Continue Aranesp  Hemoglobin 7.5 on 8/1, recs per nephro, labs with HD 11. History of left BKA 5 years ago in [place].   12. Hypertension. Norvasc 10 mg daily.    Relatively stable on 8/3 13. Morbid obesity. BMI 43.78. Dietary follow-up 14. Hyperlipidemia. Lipitor 15. History of syphilis. Treated outpatient with penicillin to the healthcare department recently. Outpatient follow-up 16. Urine retention:   Urology finally was able to pass 12 Fr coude cath   Foley to be left in place per urology, discussed with urology, due to ongoing edema and patient insistence, Foley to stay in place with follow-up on 8/10 by urology  UA equivocal, urine culture with insignificant growth 17.  Sleep disturbance  Melatonin 4.5 mg nightly    Unable to tolerate CPAP at present 18.  Leukocytosis  WBCs 12.7 on 8/1, labs with HD  Afebrile  Continue to monitor 19.  Constipation  Bowel meds increased on 7/28, decreased on 7/31  Improving  LOS: 12 days A FACE TO FACE EVALUATION WAS PERFORMED  Sofiya Ezelle Lorie Phenix 02/20/2019, 8:35 AM

## 2019-02-20 NOTE — Progress Notes (Addendum)
Urology Progress Note   48 year old male with end-stage renal disease, morbid obesity, GERD, OSA, who was admitted in July 2020 for initiation of dialysis.  Now in rehab.  Was initially thought to be newly anuric but was still making urine despite being on dialysis.  Urology was consulted on 02/09/2019 due to abdominal pain and bladder scan showed 600 cc.  34 French catheter was eventually placed over wire but catheterization was extremely difficult due to edematous foreskin, edematous scrotum, pannus, severe phimosis, meatal stenosis.  Interval: Upon entering the room the patient immediatly said that "he would kill me if I touched him". I asked for his permission to exam him visually to assess his swelling and he agreed to this.  Foreskin swelling has decreases but scrotal swelling has not yet improved.  Doing well with catheter.  Urine output most days is about 250cc.At baseline he is unable to retract his foreskin and pannus enough to visualize his meatus but does says he normally voids volitionally.   Objective: Vital signs in last 24 hours: Temp:  [98.8 F (37.1 C)-99.5 F (37.5 C)] 98.9 F (37.2 C) (08/03 0526) Pulse Rate:  [79-85] 79 (08/03 0526) Resp:  [19-20] 20 (08/03 0526) BP: (126-132)/(69-82) 127/81 (08/03 0526) SpO2:  [97 %-98 %] 98 % (08/03 0526) Weight:  [137.6 kg] 137.6 kg (08/03 0526)  Intake/Output from previous day: 08/02 0701 - 08/03 0700 In: 920 [P.O.:920] Out: 250 [Urine:250] Intake/Output this shift: Total I/O In: 120 [P.O.:120] Out: 250 [Urine:250]  Physical Exam:  GU: Scrotal edema moderate. Less foreskin edema but exam limited by patient not being agreeable to a full exam.    Lab Results: Recent Labs    02/18/19 1332  HGB 7.5*  HCT 24.4*   BMET Recent Labs    02/18/19 1332  NA 126*  K 4.1  CL 91*  CO2 25  GLUCOSE 124*  BUN 43*  CREATININE 6.68*  CALCIUM 7.9*     Studies/Results: No results found.  Assessment/Plan:  48 y.o. male  with low volume urine output in the setting of end-stage renal disease.  Extremely difficult catheterization, now 36 French Foley placed over a wire.  Difficult situation with low urine output and anatomy that would make re-catheterization difficult. Additionally the patient is very aggressive toward me and would be unwilling to attempt any type of bedside urologic procedure again.   Plan:  -Recommend keeping Foley until edema improves further and patient is willing to have foley removed.   - Urology can reassess in about a week, 02/27/19. Foley should be removed or replaced by 03/12/19, 1 month after initial placement.  - If patient is planning to be discharged prior to 8/10 please page urology resident at [phone] to discuss catheter plan and potentially arrage outpatient follow-up with [facility].   We will need to followup in office as discussed above.  Dispo: Floor   LOS: 12 days   Tharon Aquas 02/20/2019, 6:35 AM

## 2019-02-21 ENCOUNTER — Inpatient Hospital Stay (HOSPITAL_COMMUNITY): Payer: Medicare Other | Admitting: Physical Therapy

## 2019-02-21 ENCOUNTER — Inpatient Hospital Stay (HOSPITAL_COMMUNITY): Payer: Medicare Other | Admitting: Rehabilitation

## 2019-02-21 ENCOUNTER — Encounter: Payer: Self-pay | Admitting: *Deleted

## 2019-02-21 ENCOUNTER — Inpatient Hospital Stay (HOSPITAL_COMMUNITY): Payer: Medicare Other | Admitting: Occupational Therapy

## 2019-02-21 LAB — RENAL FUNCTION PANEL
Albumin: 1.6 g/dL — ABNORMAL LOW (ref 3.5–5.0)
Anion gap: 10 (ref 5–15)
BUN: 32 mg/dL — ABNORMAL HIGH (ref 6–20)
CO2: 25 mmol/L (ref 22–32)
Calcium: 7.7 mg/dL — ABNORMAL LOW (ref 8.9–10.3)
Chloride: 94 mmol/L — ABNORMAL LOW (ref 98–111)
Creatinine, Ser: 5.45 mg/dL — ABNORMAL HIGH (ref 0.61–1.24)
GFR calc Af Amer: 13 mL/min — ABNORMAL LOW (ref >60)
GFR calc non Af Amer: 12 mL/min — ABNORMAL LOW (ref >60)
Glucose, Bld: 144 mg/dL — ABNORMAL HIGH (ref 70–99)
Phosphorus: 3.1 mg/dL (ref 2.5–4.6)
Potassium: 4 mmol/L (ref 3.5–5.1)
Sodium: 129 mmol/L — ABNORMAL LOW (ref 135–145)

## 2019-02-21 LAB — CBC
HCT: 24 % — ABNORMAL LOW (ref 39.0–52.0)
Hemoglobin: 7.3 g/dL — ABNORMAL LOW (ref 13.0–17.0)
MCH: 28.9 pg (ref 26.0–34.0)
MCHC: 30.4 g/dL (ref 30.0–36.0)
MCV: 94.9 fL (ref 80.0–100.0)
Platelets: 399 10*3/uL (ref 150–400)
RBC: 2.53 MIL/uL — ABNORMAL LOW (ref 4.22–5.81)
RDW: 13.2 % (ref 11.5–15.5)
WBC: 12.4 10*3/uL — ABNORMAL HIGH (ref 4.0–10.5)
nRBC: 0 % (ref 0.0–0.2)

## 2019-02-21 MED ORDER — HEPARIN SODIUM (PORCINE) 1000 UNIT/ML DIALYSIS: 20.0000 [IU]/kg | INTRAMUSCULAR | Status: DC | PRN

## 2019-02-21 MED ORDER — DARBEPOETIN ALFA 150 MCG/0.3ML IJ SOSY
PREFILLED_SYRINGE | INTRAMUSCULAR | Status: AC
  Administered 2019-02-21: 150 ug via INTRAVENOUS
  Filled 2019-02-21: qty 0.3

## 2019-02-21 NOTE — Procedures (Signed)
Patient was seen on dialysis and the procedure was supervised.  BFR 400  Via TDC BP is  159/69.   Patient appears to be tolerating treatment well  Louis Meckel 02/21/2019

## 2019-02-21 NOTE — Consult Note (Signed)
WOC reviewed vascular note, does not feel arterial in nature. If confirmed dx desired would need punch bx from general surgery.   Will need long term follow up in wound care center of the patient's choice as well.   Continue santyl and dry dressings daily.    Re consult if needed, will not follow at this time. Thanks  Lajune Perine [employer], RN,CWOCN, CNS, [place] [phone])

## 2019-02-21 NOTE — Progress Notes (Signed)
[place] PHYSICAL MEDICINE & REHABILITATION PROGRESS NOTE   Subjective/Complaints: Patient seen sitting up in bed this AM.  He states he slept well overnight.  He notes decrease in scrotal edema.  He states he was not seen by Vasc yesterday, however, notes reviewed in chart - suggesting nonvascular etiology for wounds. Discussed scrotal sling with therapies.  Discussed CPAP and shaving with patient and nursing as well.  Patient states he was seen by Nephro yesterday.   ROS: + Scrotal edema, improving.  Denies CP, shortness of breath, N/V/D  Objective:   No results found. Recent Labs    02/18/19 1332 02/20/19 1430  WBC 12.7* 12.7*  HGB 7.5* 7.3*  HCT 24.4* 24.6*  PLT 387 435*   Recent Labs    02/18/19 1332 02/20/19 1430  NA 126* 126*  K 4.1 4.0  CL 91* 91*  CO2 25 23  GLUCOSE 124* 140*  BUN 43* 42*  CREATININE 6.68* 6.94*  CALCIUM 7.9* 7.7*    Intake/Output Summary (Last 24 hours) at 02/21/2019 0921 Last data filed at 02/21/2019 0811 Gross per 24 hour  Intake 820 ml  Output 4200 ml  Net -3380 ml     Physical Exam: Vital Signs Blood pressure 131/73, pulse 73, temperature 98.2 F (36.8 C), temperature source Oral, resp. rate 19, height 5\' 10"  (1.778 m), weight (!) 136.9 kg, SpO2 98 %. Constitutional: No distress . Vital signs reviewed. HENT: Normocephalic.  Atraumatic. Eyes: EOMI. No discharge. Cardiovascular: No JVD. Respiratory: Normal effort.  No stridor. GI: Non-distended. Musc: RLE and scrotal edema, improving Skin: Warm and dry.  Intact. Psych: Normal mood.  Normal behavior. Neurological: Alert Motor:  RLE: 4-4+/5 proximal to distal, stable (pain inhibition) Left lower extremity: Hip flexion, knee extension 4+/5, stable Skin: Skin iswarm. He isnot diaphoretic. Left BKA healed.  RLE wound with wounds with dressing c/d/i Psychiatric: Normal mood.  Normal affect.  Assessment/Plan: 1. Functional deficits secondary to debility which require 3+ hours  per day of interdisciplinary therapy in a comprehensive inpatient rehab setting.  Physiatrist is providing close team supervision and 24 hour management of active medical problems listed below.  Physiatrist and rehab team continue to assess barriers to discharge/monitor patient progress toward functional and medical goals  Care Tool:  Bathing    Body parts bathed by patient: Right arm, Left arm, Chest, Abdomen, Face   Body parts bathed by helper: Front perineal area, Buttocks, Right upper leg, Left upper leg, Right lower leg Body parts n/a: Left lower leg   Bathing assist Assist Level: Maximal Assistance - Patient 24 - 49%     Upper Body Dressing/Undressing Upper body dressing Upper body dressing/undressing activity did not occur (including orthotics): N/A(patient already had a gown) What is the patient wearing?: Hospital gown only    Upper body assist Assist Level: Total Assistance - Patient < 25%    Lower Body Dressing/Undressing Lower body dressing    Lower body dressing activity did not occur: Refused What is the patient wearing?: Hospital gown only     Lower body assist Assist for lower body dressing: Total Assistance - Patient < 25%     Toileting Toileting Toileting Activity did not occur Landscape architect and hygiene only): Safety/medical concerns  Toileting assist Assist for toileting: Dependent - Patient 0%     Transfers Chair/bed transfer  Transfers assist  Chair/bed transfer activity did not occur: Safety/medical concerns  Chair/bed transfer assist level: Moderate Assistance - Patient 50 - 74%     Locomotion Ambulation  Ambulation assist   Ambulation activity did not occur: Safety/medical concerns(unable due to pain and scrotal and bladder issues)          Walk 10 feet activity   Assist  Walk 10 feet activity did not occur: Safety/medical concerns(unable due to pain and scrotal and bladder issues)        Walk 50 feet  activity   Assist Walk 50 feet with 2 turns activity did not occur: Safety/medical concerns(unable due to pain and scrotal and bladder issues)         Walk 150 feet activity   Assist Walk 150 feet activity did not occur: Safety/medical concerns(unable due to pain and scrotal and bladder issues)         Walk 10 feet on uneven surface  activity   Assist Walk 10 feet on uneven surfaces activity did not occur: Safety/medical concerns(unable due to pain and scrotal and bladder issues)         Wheelchair     Assist   Type of Wheelchair: Manual Wheelchair activity did not occur: Safety/medical concerns(unable due to pain and scrotal and bladder issues)  Wheelchair assist level: Supervision/Verbal cueing Max wheelchair distance: 25'    Wheelchair 50 feet with 2 turns activity    Assist    Wheelchair 50 feet with 2 turns activity did not occur: Safety/medical concerns(unable due to pain and scrotal and bladder issues)       Wheelchair 150 feet activity     Assist Wheelchair 150 feet activity did not occur: Safety/medical concerns(unable due to pain and scrotal and bladder issues)        Medical Problem List and Plan: 1.Debilitysecondary to end-stage renal disease/multi-medical. Hemodialysis recently initiated.  Continue CIR 2. Antithrombotics: -DVT/anticoagulation:Subcutaneous heparin -antiplatelet therapy: N/A 3. Pain Management:Neurontin 300 mg nightly, Ultram as needed  Stable with medications on 8/4 4. Mood:Celexa 40 mg daily -antipsychotic agents: N/A 5. Neuropsych: This patientiscapable of making decisions on hisown behalf.  Discussed with neuropsychology-appreciate recs, no SI 6. Skin/Wound Care:Scrotal sling, discussed with therapies Local care to bullae, foam dressings  Appreciate Vasc recs - nonvascular RLE wounds  Appreciate further WOC recs 7. Fluids/Electrolytes/Nutrition:Routine in and  outs 8.  ESRD:. Status post AV fistula 01/09/2019. Hemodialysis directed. -continue to diurese with HD, recs per nephro Filed Weights   02/20/19 1315 02/20/19 1724 02/21/19 0518  Weight: (!) 139.3 kg 135 kg (!) 136.9 kg   Stable on 8/4 9. Diabetes mellitus. Recent hemoglobin A1c 5.0.   Elevated on 8/3  Continue to monitor 10. Anemia of chronic disease. Continue Aranesp  Hemoglobin 7.3 on 8/3, recs per nephro, labs with HD 11. History of left BKA 5 years ago in [place].   12. Hypertension. Norvasc 10 mg daily.    Relatively stable on 8/4 13. Morbid obesity. BMI 43.78. Dietary follow-up 14. Hyperlipidemia. Lipitor 15. History of syphilis. Treated outpatient with penicillin to the healthcare department recently. Outpatient follow-up 16. Urine retention:   Urology finally was able to pass 12 Fr coude cath   Foley to be left in place per urology, discussed with urology, due to ongoing edema and patient insistence, Foley to stay in place with follow-up on 8/10 by urology  UA equivocal, urine culture with insignificant growth 17.  Sleep disturbance  Melatonin 4.5 mg nightly    Unable to tolerate CPAP at present, trial with mask after shaving 18.  Leukocytosis  WBCs 12.7 on 8/3, labs with HD  Afebrile  Continue to monitor 19.  Constipation  Bowel meds increased on 7/28, decreased on 7/31  Improving  LOS: 13 days A FACE TO FACE EVALUATION WAS PERFORMED  Josefa Syracuse Lorie Phenix 02/21/2019, 9:21 AM

## 2019-02-21 NOTE — Progress Notes (Signed)
Patient refused use of CPAP for the evening. Patient asking for a full face mask and will not wear the XL mask that he has available.

## 2019-02-21 NOTE — Progress Notes (Addendum)
Physical Therapy Session Note  Patient Details  Name: Michael Riggs MRN: [id number] Date of Birth: 11-Jan-1971  Today's Date: 02/21/2019 PT Individual Time: [phone] (Addendum: corrected time) PT Individual Time Calculation (min): 32 min   Short Term Goals: Week 2:  PT Short Term Goal 1 (Week 2): Pt will perform bed<>chair transfer w/ mod assist PT Short Term Goal 2 (Week 2): Pt will perform bed mobility w/ mod assist PT Short Term Goal 3 (Week 2): Pt will participate in 60 min of activity  Skilled Therapeutic Interventions/Progress Updates:   Pt received supine in bed and with encouragement agreeable to therapy session. Pt wearing L LE shrinker and appeared to be assisting edema management. Performed supine>sit mod A for trunk upright and mod cuing for sequencing - pt reaching for therapist's hands in assistance but encouraged to use bedrails for increased independence. Pt spontaneously laid back sideways on the bed after getting into sitting reporting increased scrotal pain and requesting to rest for a minute prior to continuing. Therapist donned L LE prosthetic total assist - appeared to fit this session. Sit<>stand using bedrails with mod A for balance and lifting; however, pt unable to come fully to standing reporting pain and feeling as though his R LE cannot support him. Pt requesting to return to supine deferring any further standing or transfer to TIS w/c - supine>sit with min A for R LE management onto bed. In supine pt able to use bedrails and slide towards [facility] without assistance.  Performed the following supine exercises with cuing for proper form/technique: - R LE heel slides, ankle pumps, short arc quads 2x10 reps each - L LE hip/knee flexion/extension Pt left supine in bed with needs in reach, bed alarm on, and nursing staff notified of pt's increased pain levels.  Therapy Documentation Precautions:  Precautions Precautions: Fall, Other (comment) Precaution Comments: L  BKA with prosthesis; significant scrotal edema Restrictions Weight Bearing Restrictions: No  Pain:   Reports pain level in scrotum of 10/10 when moving and especially when sitting on EOB - provided rest breaks and repositioning throughout session for pain management - RN notified of increase pain at end.   Therapy/Group: Individual Therapy  Tawana Scale, PT, DPT 02/21/2019, 7:57 AM

## 2019-02-21 NOTE — Progress Notes (Signed)
Occupational Therapy Session Note  Patient Details  Name: Michael Riggs MRN: [id number] Date of Birth: Jul 02, 1971  Today's Date: 02/21/2019 OT Individual Time: 0830-0900 OT Individual Time Calculation (min): 30 min    Short Term Goals: Week 2:  OT Short Term Goal 1 (Week 2): Pt will tolerate sitting EOB with MIN A for sitting balance for 10 min during functional activity OT Short Term Goal 2 (Week 2): Pt will transfer to [facility] with MOD A of 1 caregiver to decrease caregiver burden OT Short Term Goal 3 (Week 2): Pt will don sock with AE PRN and min VC for technique OT Short Term Goal 4 (Week 2): Pt will bathe with MIN A  Skilled Therapeutic Interventions/Progress Updates:    Pt greeted semi-reclined in bed watching a movie on his laptop. Pt agreeable to OT treatment session. OT fashioned pt new scrotal sling and compression for scrotal edema. OT went to don sling, and pt noted to be incontinent of large BM. Pt able to roll in bed with min A for total A peri-care and chuck pad change. OT attempted to don sling again, but material was not stretchy enough to provide adequate support/compression. OT will search for new material to try for next session. Pt left semi-reclined in bed with bed alarm on and needs met.   Therapy Documentation Precautions:  Precautions Precautions: Fall, Other (comment) Precaution Comments: L BKA with prosthesis; significant scrotal edema Restrictions Weight Bearing Restrictions: No Pain: Pain Assessment Pain Scale: 0-10 Pain Score: 0-No pain   Therapy/Group: Individual Therapy  Valma Cava 02/21/2019, 9:31 AM

## 2019-02-21 NOTE — Progress Notes (Signed)
Patient stated that he wanted to leave unit because his brother was not allowed to visit. Patient had had fiancee visit day before. Educated patient about visitor access.

## 2019-02-21 NOTE — Progress Notes (Signed)
[name], male   DOB: 06-12-1971, 48 y.o.   MRN: [id number]  [name]   Assessment/ Plan:   1.  Physical debilitation/deconditioning following hospitalization resulting in initiation of hemodialysis: Ongoing physical therapy/Occupational Therapy with inpatient rehabilitation, continues to require coaxing. 2. ESRD: New start to hemodialysis, continue dialysis on a TTS schedule via right IJ TDC as his left first stage BBF matures (created 01/09/2019 by Dr. Donnetta Hutching). Wants to run MTTS this week.  Will eventually be going to [place]  3. Anemia: Hemoglobin/hematocrit appear stable status post PRBC without overt loss, will continue to adjust ESA dosing and give PRBCs when hemoglobin is <7.0. 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  Do not use phosphate/magnesium based laxatives or enemas. 5. Nutrition: Continue renal diet with renal multivitamin.  6.  Chronic pain: Management per PM&R service.  Subjective:   Seen on HD- removed 4000 with HD yest- going for  4000 to 5000 today with HD   Objective:   BP (!) 159/69   Pulse 85   Temp 99 F (37.2 C) (Oral)   Resp (!) 83   Ht 5\' 10"  (1.778 m)   Wt (!) 137.3 kg   SpO2 98%   BMI 43.43 kg/m   Physical Exam: Gen: Resting in bed, watching videos on laptop CVS: Pulse regular rhythm, normal rate, S1 and S2 normal Resp: Anteriorly clear to auscultation, no rales/rhonchi Abd: Soft, obese, nontender Ext: Improving scrotal/thigh edema but still there .  Status post left BKA.  Left BVT with audible bruit  Labs: BMET Recent Labs  Lab 02/14/19 1946 02/16/19 1405 02/18/19 1332 02/20/19 1430 02/21/19 1256  NA 132* 126* 126* 126* 129*  K 3.8 4.0 4.1 4.0 4.0  CL 96* 91* 91* 91* 94*  CO2 27 23 25 23 25   GLUCOSE 120* 143* 124* 140* 144*  BUN 22* 41* 43* 42* 32*  CREATININE 4.66* 6.76* 6.68* 6.94* 5.45*   CALCIUM 7.9* 7.7* 7.9* 7.7* 7.7*  PHOS 2.4* 3.1 3.2 4.0 3.1   CBC Recent Labs  Lab 02/16/19 1400 02/18/19 1332 02/20/19 1430 02/21/19 1256  WBC 12.4* 12.7* 12.7* 12.4*  HGB 7.9* 7.5* 7.3* 7.3*  HCT 26.1* 24.4* 24.6* 24.0*  MCV 96.0 94.9 97.2 94.9  PLT 369 387 435* 399   Medications:    . sodium chloride   Intravenous Once  . atorvastatin  40 mg Oral Daily  . calcitRIOL  0.25 mcg Oral Daily  . calcium carbonate  1 tablet Oral BID WC  . Chlorhexidine Gluconate Cloth  6 each Topical Q0600  . Chlorhexidine Gluconate Cloth  6 each Topical Q0600  . citalopram  40 mg Oral Daily  . collagenase   Topical Daily  . darbepoetin (ARANESP) injection - DIALYSIS  150 mcg Intravenous Q Tue-HD  . gabapentin  300 mg Oral QHS  . heparin  5,000 Units Subcutaneous Q8H  . Melatonin  4.5 mg Oral QHS  . pantoprazole  40 mg Oral Daily  . polyethylene glycol  17 g Oral Daily  . senna-docusate  1 tablet Oral QHS  Michael Riggs A Michael Riggs  02/21/2019, 2:44 PM

## 2019-02-21 NOTE — Progress Notes (Signed)
Pt placed on CPAP XL full face mask.  This is the only mask patient can tolerate at this time.  When he states he needs a mask that covers his entire face this it what he is relating to.  Please do not try to put a medium mask back on patient.  This makes him uncomfortable and he is less likely to wear all night.

## 2019-02-21 NOTE — Progress Notes (Signed)
Per Dr.Early. Patient may be scheduled for second stage BVT without office appointment when medical condition improved from current admission status.

## 2019-02-22 ENCOUNTER — Inpatient Hospital Stay (HOSPITAL_COMMUNITY): Payer: Medicare Other | Admitting: Occupational Therapy

## 2019-02-22 ENCOUNTER — Inpatient Hospital Stay (HOSPITAL_COMMUNITY): Payer: Medicare Other | Admitting: Physical Therapy

## 2019-02-22 DIAGNOSIS — I96 Gangrene, not elsewhere classified: Secondary | ICD-10-CM | POA: Diagnosis not present

## 2019-02-22 LAB — GLUCOSE, CAPILLARY
Glucose-Capillary: 98 mg/dL (ref 70–99)
Glucose-Capillary: 131 mg/dL — ABNORMAL HIGH (ref 70–99)

## 2019-02-22 MED ORDER — CHLORHEXIDINE GLUCONATE CLOTH 2 % EX PADS
6.0000 | MEDICATED_PAD | Freq: Every day | CUTANEOUS | Status: DC
  Administered 2019-02-23 – 2019-03-08 (×14): 6 via TOPICAL

## 2019-02-22 MED ORDER — LIDOCAINE-EPINEPHRINE 2 %-1:100000 IJ SOLN
20.0000 mL | Freq: Once | INTRAMUSCULAR | Status: DC
  Filled 2019-02-22: qty 20

## 2019-02-22 NOTE — Progress Notes (Signed)
[facility] PHYSICAL MEDICINE & REHABILITATION PROGRESS NOTE   Subjective/Complaints: Patient seen laying in bed this morning.  He states he slept fairly overnight due to issues with his mask slipping off.  He notes he received his razor, with plans for shaving with OT today, discussed with OT.  Also discussed with OT scrotal sling, ordering from acute due to size required. She was seen by Nephro yesterday, notes reviewed.    ROS: +Scrotal edema, improving.  Denies CP, shortness of breath, N/V/D  Objective:   No results found. Recent Labs    02/20/19 1430 02/21/19 1256  WBC 12.7* 12.4*  HGB 7.3* 7.3*  HCT 24.6* 24.0*  PLT 435* 399   Recent Labs    02/20/19 1430 02/21/19 1256  NA 126* 129*  K 4.0 4.0  CL 91* 94*  CO2 23 25  GLUCOSE 140* 144*  BUN 42* 32*  CREATININE 6.94* 5.45*  CALCIUM 7.7* 7.7*    Intake/Output Summary (Last 24 hours) at 02/22/2019 0834 Last data filed at 02/22/2019 0440 Gross per 24 hour  Intake 460 ml  Output 5251 ml  Net -4791 ml     Physical Exam: Vital Signs Blood pressure (!) 144/83, pulse 93, temperature 98.7 F (37.1 C), temperature source Oral, resp. rate 18, height 5\' 10"  (1.778 m), weight 133.5 kg, SpO2 98 %. Constitutional: No distress . Vital signs reviewed. HENT: Normocephalic.  Atraumatic. Eyes: EOMI. No discharge. Cardiovascular: No JVD. Respiratory: Normal effort.  No stridor. GI: Non-distended. Skin: Warm and dry.  Intact. Psych: Normal mood.  Normal behavior. Musc: RLE and scrotal edema, improving Psych: Normal mood.  Normal behavior. Neurological: Alert Motor:  RLE: 4-4+/5 proximal to distal, unchanged (pain inhibition) Left lower extremity: Hip flexion, knee extension 4+/5, unchanged Skin: Left BKA healed.  RLE wound with wounds with dressing c/d/i Psychiatric: Normal mood.  Normal affect.  Assessment/Plan: 1. Functional deficits secondary to debility which require 3+ hours per day of interdisciplinary therapy in a  comprehensive inpatient rehab setting.  Physiatrist is providing close team supervision and 24 hour management of active medical problems listed below.  Physiatrist and rehab team continue to assess barriers to discharge/monitor patient progress toward functional and medical goals  Care Tool:  Bathing    Body parts bathed by patient: Right arm, Left arm, Chest, Abdomen, Face   Body parts bathed by helper: Front perineal area, Buttocks, Right upper leg, Left upper leg, Right lower leg Body parts n/a: Left lower leg   Bathing assist Assist Level: Maximal Assistance - Patient 24 - 49%     Upper Body Dressing/Undressing Upper body dressing Upper body dressing/undressing activity did not occur (including orthotics): N/A(patient already had a gown) What is the patient wearing?: Hospital gown only    Upper body assist Assist Level: Total Assistance - Patient < 25%    Lower Body Dressing/Undressing Lower body dressing    Lower body dressing activity did not occur: Refused What is the patient wearing?: Hospital gown only     Lower body assist Assist for lower body dressing: Total Assistance - Patient < 25%     Toileting Toileting Toileting Activity did not occur Landscape architect and hygiene only): Safety/medical concerns  Toileting assist Assist for toileting: Dependent - Patient 0%     Transfers Chair/bed transfer  Transfers assist  Chair/bed transfer activity did not occur: Safety/medical concerns  Chair/bed transfer assist level: Moderate Assistance - Patient 50 - 74%     Locomotion Ambulation   Ambulation assist  Ambulation activity did not occur: Safety/medical concerns(unable due to pain and scrotal and bladder issues)          Walk 10 feet activity   Assist  Walk 10 feet activity did not occur: Safety/medical concerns(unable due to pain and scrotal and bladder issues)        Walk 50 feet activity   Assist Walk 50 feet with 2 turns activity  did not occur: Safety/medical concerns(unable due to pain and scrotal and bladder issues)         Walk 150 feet activity   Assist Walk 150 feet activity did not occur: Safety/medical concerns(unable due to pain and scrotal and bladder issues)         Walk 10 feet on uneven surface  activity   Assist Walk 10 feet on uneven surfaces activity did not occur: Safety/medical concerns(unable due to pain and scrotal and bladder issues)         Wheelchair     Assist   Type of Wheelchair: Manual Wheelchair activity did not occur: Safety/medical concerns(unable due to pain and scrotal and bladder issues)  Wheelchair assist level: Supervision/Verbal cueing Max wheelchair distance: 25'    Wheelchair 50 feet with 2 turns activity    Assist    Wheelchair 50 feet with 2 turns activity did not occur: Safety/medical concerns(unable due to pain and scrotal and bladder issues)       Wheelchair 150 feet activity     Assist Wheelchair 150 feet activity did not occur: Safety/medical concerns(unable due to pain and scrotal and bladder issues)        Medical Problem List and Plan: 1.Debilitysecondary to end-stage renal disease/multi-medical. Hemodialysis recently initiated.  Continue CIR  Team conference today to discuss current and goals and coordination of care, home and environmental barriers, and discharge planning with nursing, case manager, and therapies.  2. Antithrombotics: -DVT/anticoagulation:Subcutaneous heparin -antiplatelet therapy: N/A 3. Pain Management:Neurontin 300 mg nightly, Ultram as needed  Stable with medications on 8/5 4. Mood:Celexa 40 mg daily -antipsychotic agents: N/A 5. Neuropsych: This patientiscapable of making decisions on hisown behalf.  Discussed with neuropsychology-appreciate recs, no SI 6. Skin/Wound Care:Scrotal sling, discussed with therapies again, working on new sling Local care to  bullae, foam dressings  Appreciate Vasc recs - nonvascular RLE wounds  Appreciate further WOC recs, will consult surgery for bx 7. Fluids/Electrolytes/Nutrition:Routine in and outs 8.  ESRD:. Status post AV fistula 01/09/2019. Hemodialysis directed. -continue to diurese with HD, recs per nephro Filed Weights   02/21/19 1235 02/21/19 1643 02/22/19 0440  Weight: (!) 137.3 kg 133.2 kg 133.5 kg   Stable on 8/5 9. Diabetes mellitus. Recent hemoglobin A1c 5.0.   Elevated on 8/3  Continue to monitor 10. Anemia of chronic disease. Continue Aranesp  Hemoglobin 7.3 on 8/4, recs per nephro, labs with HD 11. History of left BKA 5 years ago in [place].   12. Hypertension. Norvasc 10 mg daily.    Relatively stable on 8/5 13. Morbid obesity. BMI 43.78. Dietary follow-up 14. Hyperlipidemia. Lipitor 15. History of syphilis. Treated outpatient with penicillin to the healthcare department recently. Outpatient follow-up 16. Urine retention:   Urology finally was able to pass 12 Fr coude cath   Foley to be left in place per urology, discussed with urology, due to ongoing edema and patient insistence, Foley to stay in place with follow-up on 8/10 by urology  UA equivocal, urine culture with insignificant growth 17.  Sleep disturbance  Melatonin 4.5 mg nightly  Unable to tolerate CPAP at present, trial with mask after shaving today 18.  Leukocytosis  WBCs 12.7 on 8/3, labs with HD  Afebrile  Continue to monitor 19.  Constipation  Bowel meds increased on 7/28, decreased on 7/31, decreased on 8/5  LOS: 14 days A FACE TO FACE EVALUATION WAS PERFORMED  Cleatus Gabriel Lorie Phenix 02/22/2019, 8:34 AM

## 2019-02-22 NOTE — Progress Notes (Signed)
[name], male   DOB: 07/13/1971, 48 y.o.   MRN: [id number]   [facility] Progress Note   Assessment/ Plan:   1.  Physical debilitation/deconditioning following hospitalization resulting in initiation of hemodialysis: Ongoing physical therapy/Occupational Therapy with inpatient rehabilitation, continues to require coaxing. 2. ESRD: New start to hemodialysis, continue dialysis on a TTS schedule via right IJ TDC as his left first stage BBF matures (created 01/09/2019 by Dr. Donnetta Hutching). Wants to run MTTS this week.  Will eventually be going to [place] [facility] 3. Anemia: Hemoglobin/hematocrit appear stable status post PRBC without overt loss, will continue to adjust ESA dosing- on darbe 150 q week and give PRBCs when hemoglobin is <7.0. 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  Do not use phosphate/magnesium based laxatives or enemas. 5. Nutrition: Continue renal diet with renal multivitamin.  6.  Chronic pain: Management per PM&R service.  Subjective:   HD yest- removed 5000 tolerated well- wants to go today but we do not need to    Objective:   BP (!) 144/83 (BP Location: Right Arm)   Pulse 93   Temp 98.7 F (37.1 C) (Oral)   Resp 18   Ht 5\' 10"  (1.778 m)   Wt 133.5 kg   SpO2 98%   BMI 42.23 kg/m   Physical Exam: Gen: Resting in bed, no new c/o's  CVS: Pulse regular rhythm, normal rate, S1 and S2 normal Resp: Anteriorly clear to auscultation, no rales/rhonchi Abd: Soft, obese, nontender Ext: Improving scrotal/thigh edema but still there .  Status post left BKA.  Left BVT with audible bruit  Labs: BMET Recent Labs  Lab 02/16/19 1405 02/18/19 1332 02/20/19 1430 02/21/19 1256  NA 126* 126* 126* 129*  K 4.0 4.1 4.0 4.0  CL 91* 91* 91* 94*  CO2 23 25 23 25   GLUCOSE 143* 124* 140* 144*  BUN 41* 43* 42* 32*  CREATININE 6.76* 6.68* 6.94* 5.45*   CALCIUM 7.7* 7.9* 7.7* 7.7*  PHOS 3.1 3.2 4.0 3.1   CBC Recent Labs  Lab 02/16/19 1400 02/18/19 1332 02/20/19 1430 02/21/19 1256  WBC 12.4* 12.7* 12.7* 12.4*  HGB 7.9* 7.5* 7.3* 7.3*  HCT 26.1* 24.4* 24.6* 24.0*  MCV 96.0 94.9 97.2 94.9  PLT 369 387 435* 399   Medications:    . sodium chloride   Intravenous Once  . atorvastatin  40 mg Oral Daily  . calcitRIOL  0.25 mcg Oral Daily  . calcium carbonate  1 tablet Oral BID WC  . Chlorhexidine Gluconate Cloth  6 each Topical Q0600  . Chlorhexidine Gluconate Cloth  6 each Topical Q0600  . citalopram  40 mg Oral Daily  . collagenase   Topical Daily  . darbepoetin (ARANESP) injection - DIALYSIS  150 mcg Intravenous Q Tue-HD  . gabapentin  300 mg Oral QHS  . heparin  5,000 Units Subcutaneous Q8H  . lidocaine-EPINEPHrine  20 mL Infiltration Once  . Melatonin  4.5 mg Oral QHS  . pantoprazole  40 mg Oral Daily  . polyethylene glycol  17 g Oral Daily  Tennelle Taflinger A Greg Eckrich  02/22/2019, 2:13 PM

## 2019-02-22 NOTE — Progress Notes (Signed)
Occupational Therapy Session Note  Patient Details  Name: Michael Riggs MRN: [id number] Date of Birth: 05/28/1971  Today's Date: 02/22/2019 OT Individual Time: 8242-3536 OT Individual Time Calculation (min): 53 min    Short Term Goals: Week 2:  OT Short Term Goal 1 (Week 2): Pt will tolerate sitting EOB with MIN A for sitting balance for 10 min during functional activity OT Short Term Goal 2 (Week 2): Pt will transfer to [facility] with MOD A of 1 caregiver to decrease caregiver burden OT Short Term Goal 3 (Week 2): Pt will don sock with AE PRN and min VC for technique OT Short Term Goal 4 (Week 2): Pt will bathe with MIN A  Skilled Therapeutic Interventions/Progress Updates:    Pt greeted semi-reclined in bed, complaining of R LE pain, but agreeable to therapy. Pt states he wants to shave today. Pt agreeable to get up to wc and go to the sink with encouragement. Pt very particular about how you help him move. HOB raised all the way up, then pt able to bring LEs to EOB. OT placed prosthetic sleeve on LLE and L prosthesis donned in sitting with OT assist. Pt declined using slideboard to transfer despite OT encouragement. Pt stating "see you don't listen to me on how I like to do things." Pt then completed stand-pivot with mod A, but lost his balance and almost did not make it to the chair. LEs raised and pillow placed under R LE for comfort. Pt brought to the sink for shaving task. Pt needed set-up A 2/2 unable to reach the sink with LEs elevated and poor core strength to lean forward. Pt took a long time shaving .2/2 only using regular razors. Pt handoff to PT for next session.  Therapy Documentation Precautions:  Precautions Precautions: Fall, Other (comment) Precaution Comments: L BKA with prosthesis; significant scrotal edema Restrictions Weight Bearing Restrictions: No Pain: Reports pain in R LE , unable to give number, states it is manageable   Therapy/Group: Individual  Therapy  Valma Cava 02/22/2019, 9:25 AM

## 2019-02-22 NOTE — Progress Notes (Signed)
Occupational Therapy Session Note  Patient Details  Name: Michael Riggs MRN: [id number] Date of Birth: [birth year]  Today's Date: 02/22/2019 OT Individual Time: 6895-7022 OT Individual Time Calculation (min): 9 min    Short Term Goals: Week 2:  OT Short Term Goal 1 (Week 2): Pt will tolerate sitting EOB with MIN A for sitting balance for 10 min during functional activity OT Short Term Goal 2 (Week 2): Pt will transfer to [facility] with MOD A of 1 caregiver to decrease caregiver burden OT Short Term Goal 3 (Week 2): Pt will don sock with AE PRN and min VC for technique OT Short Term Goal 4 (Week 2): Pt will bathe with MIN A  Skilled Therapeutic Interventions/Progress Updates:    Pt greeted semi-reclined in bed. OT fashioned pt new scrotal sling to be tried in therapy tomorrow as physician entering room to assess R LE wounds. Pt left semi-reclined in bed with needs met.   Therapy Documentation Precautions:  Precautions Precautions: Fall, Other (comment) Precaution Comments: L BKA with prosthesis; significant scrotal edema Restrictions Weight Bearing Restrictions: No Pain: No pain reported at this time  Therapy/Group: Individual Therapy  Valma Cava 02/22/2019, 3:19 PM

## 2019-02-22 NOTE — Consult Note (Signed)
[facility] Surgery Consult Note  Michael Riggs 12/07/1970  [id number].    Requesting MD: Olin Hauser love PA-C Chief Complaint: Nonhealing ulcer Reason for Consult: Skin biopsy   HPI: Patient is a 48 year old male with chronic kidney disease on dialysis, left BKA, morbid obesity, who was admitted on 01/30/2019 with a history of acute encephalopathy in the settings of uremia; he was placed on dialysis at that time.  He returned on 02/04/2019 with increasing shortness of breath and difficulty ambulating, feeling weak.  He was seen and [facility] with a nonfocal exam he was afebrile.  At baseline chest x-ray showed some mild vascular congestion.  He had skin tears the right lower extremity and severe fluid overload with anasarca.  He was readmitted for dialysis.  He was also treated for his additional problems including hypertension, hyperlipidemia type 2 diabetes, anemia, chronic pain and a recent diagnosis of syphilis.  He was transferred to rehab on 02/09/2019, with debility secondary to end-stage renal disease and multiple medical problems.  The skin breakdowns on his right lower extremity has not healed.  He was evaluated by Dr. Murrell Converse vascular surgery on 02/20/2019.  It was his opinion that the right leg wound was nonvascular in etiology calciphylaxis was considered a possibility.  No further vascular work-up was required. We are asked to see and do a skin biopsy to rule out calciphylaxis.     ROS: Review of Systems  All other systems reviewed and are negative.   History reviewed. No pertinent family history.  Past Medical History:  Diagnosis Date  . Anemia   . Chronic kidney disease    Stage 5  . Depression   . Diabetes mellitus without complication ([place])   . Dyspnea   . GERD (gastroesophageal reflux disease)   . Hypertension     Past Surgical History:  Procedure Laterality Date  . AV FISTULA PLACEMENT Left 01/09/2019   Procedure: ARTERIOVENOUS (AV) FISTULA  CREATION LEFT UPPER ARM;  Surgeon: [name], MD;  Location: [facility] OR;  Service: Vascular;  Laterality: Left;  . below the knee amputation Left   . EYE SURGERY    . IR FLUORO GUIDE CV LINE RIGHT  01/31/2019  . IR FLUORO GUIDE CV LINE RIGHT  02/03/2019  . IR US GUIDE VASC ACCESS RIGHT  01/31/2019  . IR US GUIDE VASC ACCESS RIGHT  02/03/2019    Social History:  reports that he quit smoking about 5 weeks ago. His smoking use included cigarettes. He started smoking about 3 months ago. He has never used smokeless tobacco. He reports previous alcohol use. He reports previous drug use.  Allergies: No Known Allergies  Medications Prior to Admission  Medication Sig Dispense Refill  . albuterol (PROVENTIL) (2.5 MG/3ML) 0.083% nebulizer solution Inhale 3 mLs into the lungs every 6 (six) hours as needed for wheezing or shortness of breath. 75 mL 12  . amLODipine (NORVASC) 10 MG tablet Take 10 mg by mouth daily.    Marland Kitchen atorvastatin (LIPITOR) 40 MG tablet Take 40 mg by mouth daily.     . calcitRIOL (ROCALTROL) 0.25 MCG capsule Take 1 capsule (0.25 mcg total) by mouth daily.    . calcium carbonate (TUMS - DOSED IN MG ELEMENTAL CALCIUM) 500 MG chewable tablet Chew 1 tablet (200 mg of elemental calcium total) by mouth 2 (two) times daily with a meal.    . Chlorhexidine Gluconate Cloth 2 % PADS Apply 6 each topically daily at 6 (six) AM.    .  citalopram (CELEXA) 40 MG tablet Take 40 mg by mouth daily.   0  . collagenase (SANTYL) ointment Apply topically daily. 15 g 0  . Darbepoetin Alfa (ARANESP) 100 MCG/0.5ML SOSY injection Inject 0.5 mLs (100 mcg total) into the vein every Tuesday with hemodialysis. 4.2 mL   . ferric gluconate 62.5 mg in sodium chloride 0.9 % 100 mL Inject 62.5 mg into the vein Every Tuesday,Thursday,and Saturday with dialysis.    Marland Kitchen gabapentin (NEURONTIN) 300 MG capsule Take 1 capsule (300 mg total) by mouth at bedtime.    . heparin 5000 UNIT/ML injection Inject 1 mL (5,000 Units total) into  the skin every 8 (eight) hours. 1 mL   . hydrOXYzine (ATARAX/VISTARIL) 25 MG tablet Take 25 mg by mouth every 8 (eight) hours as needed for anxiety.   0  . insulin aspart (NOVOLOG) 100 UNIT/ML injection Inject 0-9 Units into the skin 3 (three) times daily with meals. 10 mL   . insulin aspart (NOVOLOG) 100 UNIT/ML injection Inject 0-5 Units into the skin at bedtime. 10 mL   . Melatonin 3 MG TABS Take 1.5 tablets (4.5 mg total) by mouth at bedtime.  0  . ondansetron (ZOFRAN) 4 MG tablet Take 4 mg by mouth daily as needed for nausea or vomiting.     . pantoprazole (PROTONIX) 40 MG tablet Take 1 tablet (40 mg total) by mouth daily.    . polyethylene glycol (MIRALAX / GLYCOLAX) 17 g packet Take 17 g by mouth daily. 14 each 0  . senna-docusate (SENOKOT-S) 8.6-50 MG tablet Take 1 tablet by mouth 2 (two) times daily.    . traMADol (ULTRAM) 50 MG tablet Take by mouth every 6 (six) hours as needed.      Blood pressure (!) 144/83, pulse 93, temperature 98.7 F (37.1 C), temperature source Oral, resp. rate 18, height 5\' 10"  (1.778 m), weight 133.5 kg, SpO2 98 %. Physical Exam: Physical Exam Constitutional:      General: He is not in acute distress.    Appearance: He is obese. He is not ill-appearing, toxic-appearing or diaphoretic.  HENT:     Head: Normocephalic and atraumatic.     Nose: Nose normal. No congestion or rhinorrhea.  Eyes:     General: No scleral icterus.    Conjunctiva/sclera: Conjunctivae normal.     Comments: Pupils are equal  Neck:     Musculoskeletal: Normal range of motion and neck supple.  Cardiovascular:     Rate and Rhythm: Normal rate and regular rhythm.     Heart sounds: No murmur.  Pulmonary:     Effort: Pulmonary effort is normal. No respiratory distress.     Breath sounds: Normal breath sounds. No stridor. No wheezing, rhonchi or rales.  Chest:     Chest wall: No tenderness.  Abdominal:     General: Bowel sounds are normal. There is no distension.     Palpations:  Abdomen is soft. There is no mass.     Tenderness: There is no abdominal tenderness. There is no right CVA tenderness, left CVA tenderness, guarding or rebound.     Hernia: No hernia is present.  Musculoskeletal:     Comments: S/p left BKA  Skin:    General: Skin is warm and dry.     Capillary Refill: Capillary refill takes less than 2 seconds.     Comments: See picture of right lower extremity below.  Neurological:     General: No focal deficit present.     Mental  Status: He is alert and oriented to person, place, and time.     Cranial Nerves: No cranial nerve deficit.  Psychiatric:        Mood and Affect: Mood normal.        Behavior: Behavior normal.        Thought Content: Thought content normal.        Judgment: Judgment normal.    Biopsy taken at the top right site      Results for orders placed or performed during the hospital encounter of 02/08/19 (from the past 48 hour(s))  Renal function panel     Status: Abnormal   Collection Time: 02/20/19  2:30 PM  Result Value Ref Range   Sodium 126 (L) 135 - 145 mmol/L   Potassium 4.0 3.5 - 5.1 mmol/L   Chloride 91 (L) 98 - 111 mmol/L   CO2 23 22 - 32 mmol/L   Glucose, Bld 140 (H) 70 - 99 mg/dL   BUN 42 (H) 6 - 20 mg/dL   Creatinine, Ser 6.94 (H) 0.61 - 1.24 mg/dL   Calcium 7.7 (L) 8.9 - 10.3 mg/dL   Phosphorus 4.0 2.5 - 4.6 mg/dL   Albumin 1.5 (L) 3.5 - 5.0 g/dL   GFR calc non Af Amer 9 (L) >60 mL/min   GFR calc Af Amer 10 (L) >60 mL/min   Anion gap 12 5 - 15    Comment: Performed at [facility], [address] [place], [place] [address]  CBC     Status: Abnormal   Collection Time: 02/20/19  2:30 PM  Result Value Ref Range   WBC 12.7 (H) 4.0 - 10.5 K/uL   RBC 2.53 (L) 4.22 - 5.81 MIL/uL   Hemoglobin 7.3 (L) 13.0 - 17.0 g/dL   HCT 24.6 (L) 39.0 - 52.0 %   MCV 97.2 80.0 - 100.0 fL   MCH 28.9 26.0 - 34.0 pg   MCHC 29.7 (L) 30.0 - 36.0 g/dL   RDW 13.4 11.5 - 15.5 %   Platelets 435 (H) 150 - 400 K/uL   nRBC  0.0 0.0 - 0.2 %    Comment: Performed at [facility] [address] [place], [place] [address]  Renal function panel     Status: Abnormal   Collection Time: 02/21/19 12:56 PM  Result Value Ref Range   Sodium 129 (L) 135 - 145 mmol/L   Potassium 4.0 3.5 - 5.1 mmol/L   Chloride 94 (L) 98 - 111 mmol/L   CO2 25 22 - 32 mmol/L   Glucose, Bld 144 (H) 70 - 99 mg/dL   BUN 32 (H) 6 - 20 mg/dL   Creatinine, Ser 5.45 (H) 0.61 - 1.24 mg/dL   Calcium 7.7 (L) 8.9 - 10.3 mg/dL   Phosphorus 3.1 2.5 - 4.6 mg/dL   Albumin 1.6 (L) 3.5 - 5.0 g/dL   GFR calc non Af Amer 12 (L) >60 mL/min   GFR calc Af Amer 13 (L) >60 mL/min   Anion gap 10 5 - 15    Comment: Performed at [facility], [address] [place] [address]  CBC     Status: Abnormal   Collection Time: 02/21/19 12:56 PM  Result Value Ref Range   WBC 12.4 (H) 4.0 - 10.5 K/uL   RBC 2.53 (L) 4.22 - 5.81 MIL/uL   Hemoglobin 7.3 (L) 13.0 - 17.0 g/dL   HCT 24.0 (L) 39.0 - 52.0 %   MCV 94.9 80.0 -  100.0 fL   MCH 28.9 26.0 - 34.0 pg   MCHC 30.4 30.0 - 36.0 g/dL   RDW 13.2 11.5 - 15.5 %   Platelets 399 150 - 400 K/uL   nRBC 0.0 0.0 - 0.2 %    Comment: Performed at [facility], [place] [address], [place], [place] [address]  Glucose, capillary     Status: None   Collection Time: 02/22/19 12:33 PM  Result Value Ref Range   Glucose-Capillary 98 70 - 99 mg/dL   No results found.    Assessment/Plan  Non healing skin ulcer right lower extremity, rule out calciphylaxis CKD on dialysis  Type 2 diabetes S/p left BKA Severe disability placed on rehab Hypertension Chronic pain Morbid obesity BMI 42.23 Hx syphilis GERD  Plan: Patient was seen with a request for skin biopsy.  This was completed see note below.  Procedure: Skin biopsy right lower extremity  Patient was in bed.  A side right lower extremity was chosen.  We explained the procedure to the patient in detail and obtained operative consent.  The site was  cleaned with Betadine.  1% Xylocaine with epinephrine was infiltrated into the site.  Anesthesia was obtained.  Using a 4 mm skin punch a specimen was taken.  This was placed in formalin.  There is no significant bleeding from the site.  It was cleaned and dressed with Betadine.  At the completion of the procedure we dressed the entire leg with Xeroform gauze and Kerlix.  Patient tolerated procedure well, and the specimen was delivered to the histology lab.   [facility] [place] Surgery Pager:  [phone]    02/22/2019 2:22 PM

## 2019-02-22 NOTE — Progress Notes (Signed)
Physical Therapy Session Note  Patient Details  Name: Michael Riggs MRN: [id number] Date of Birth: 10/20/1970  Today's Date: 02/22/2019 PT Individual Time: 0830-0900 AND 1005-1030 PT Individual Time Calculation (min): 30 min AND 25 min  Short Term Goals: Week 2:  PT Short Term Goal 1 (Week 2): Pt will perform bed<>chair transfer w/ mod assist PT Short Term Goal 2 (Week 2): Pt will perform bed mobility w/ mod assist PT Short Term Goal 3 (Week 2): Pt will participate in 60 min of activity  Skilled Therapeutic Interventions/Progress Updates:   Session 1: Pt received in TIS at sink, requesting assistance to finish shaving. Needed total assist to rinse razor as pt unable to perform any anterior weight shifting to reach sink/water. Pt able to shave most of his face w/o physical assist, total assist needed for parts at bottom of neck. Pt very reluctant to try things himself, things he perceives that he is unable to do such as reaching forward to sink or reaching high enough to shave parts of his face. Pt continually states "If I could do it, I would, that's what I keep telling you people". Educated on PT/OT's role in facilitating increased independence and pushing pt past his comfort zone. After this education, pt agreeable to attempt slide board transfer back to EOB instead of maximove. Performed slide board transfer w/ mod assist overall for LE placement and slide board management. Once to EOB, pt impulsively laid down instead of finishing slide board transfer, mod assist to bring legs up onto bed. Ended session in supine, all needs in reach.   Session 2:  Pt in supine, agreeable to therapy, pain 10/10 in RLE and scrotum (premedicated). PA-C present to perform dressing change on R lower leg, assisted w/ lifting up leg, pt able to perform ~50% himself. Session focused on BLE strengthening. Performed supine therex as detailed below. Intermittent rest 2/2 pain w/ motions. Ended session in supine,  all needs in reach. Encouraged pt to perform similar motions outside of therapy, educated on benefits of muscle pump in RLE for fluid return. Pt verbalized understanding.   BLE strengthening -R SAQ 2x10 -R ankle pump 3x10 -L SLR 2x10 -L knee-to-chest  2x10 -R assisted heel slide 2x10  Therapy Documentation Precautions:  Precautions Precautions: Fall, Other (comment) Precaution Comments: L BKA with prosthesis; significant scrotal edema Restrictions Weight Bearing Restrictions: No Pain: Pain Assessment Pain Score: 0-No pain  Therapy/Group: Individual Therapy  [name] [name] 02/22/2019, 9:27 AM

## 2019-02-22 NOTE — Progress Notes (Signed)
[facility] consulted for skin biopsy of lesions on right shin due to question calciphylaxis.  RLE--laterally     RLE- medially

## 2019-02-23 ENCOUNTER — Inpatient Hospital Stay (HOSPITAL_COMMUNITY): Payer: Medicare Other

## 2019-02-23 ENCOUNTER — Inpatient Hospital Stay (HOSPITAL_COMMUNITY): Payer: Medicare Other | Admitting: Physical Therapy

## 2019-02-23 ENCOUNTER — Inpatient Hospital Stay (HOSPITAL_COMMUNITY): Payer: Medicare Other | Admitting: Occupational Therapy

## 2019-02-23 LAB — CBC
HCT: 24.7 % — ABNORMAL LOW (ref 39.0–52.0)
Hemoglobin: 7.5 g/dL — ABNORMAL LOW (ref 13.0–17.0)
MCH: 29 pg (ref 26.0–34.0)
MCHC: 30.4 g/dL (ref 30.0–36.0)
MCV: 95.4 fL (ref 80.0–100.0)
Platelets: 402 10*3/uL — ABNORMAL HIGH (ref 150–400)
RBC: 2.59 MIL/uL — ABNORMAL LOW (ref 4.22–5.81)
RDW: 13.6 % (ref 11.5–15.5)
WBC: 13.7 10*3/uL — ABNORMAL HIGH (ref 4.0–10.5)
nRBC: 0 % (ref 0.0–0.2)

## 2019-02-23 LAB — RENAL FUNCTION PANEL
Albumin: 1.6 g/dL — ABNORMAL LOW (ref 3.5–5.0)
Anion gap: 11 (ref 5–15)
BUN: 36 mg/dL — ABNORMAL HIGH (ref 6–20)
CO2: 24 mmol/L (ref 22–32)
Calcium: 7.9 mg/dL — ABNORMAL LOW (ref 8.9–10.3)
Chloride: 90 mmol/L — ABNORMAL LOW (ref 98–111)
Creatinine, Ser: 6.18 mg/dL — ABNORMAL HIGH (ref 0.61–1.24)
GFR calc Af Amer: 12 mL/min — ABNORMAL LOW (ref >60)
GFR calc non Af Amer: 10 mL/min — ABNORMAL LOW (ref >60)
Glucose, Bld: 133 mg/dL — ABNORMAL HIGH (ref 70–99)
Phosphorus: 3.6 mg/dL (ref 2.5–4.6)
Potassium: 4.6 mmol/L (ref 3.5–5.1)
Sodium: 125 mmol/L — ABNORMAL LOW (ref 135–145)

## 2019-02-23 MED ORDER — HEPARIN SODIUM (PORCINE) 1000 UNIT/ML IJ SOLN
INTRAMUSCULAR | Status: AC
  Administered 2019-02-23: 3800 [IU] via INTRAVENOUS_CENTRAL
  Filled 2019-02-23: qty 4

## 2019-02-23 MED ORDER — HEPARIN SODIUM (PORCINE) 1000 UNIT/ML DIALYSIS
20.0000 [IU]/kg | INTRAMUSCULAR | Status: DC | PRN
  Administered 2019-02-23: 3800 [IU] via INTRAVENOUS_CENTRAL
  Filled 2019-02-23: qty 2.7

## 2019-02-23 NOTE — Progress Notes (Signed)
Occupational Therapy Note  Patient Details  Name: Eldridge Rielly MRN: [id number] Date of Birth: [birth year]  Today's Date: 02/23/2019 OT Missed Time: 99 Minutes Missed Time Reason: Patient fatigue  Pt greeted semi-reclined in bed asleep with C-PAP on. OT woke pt and tried to engage him in therapy session. Pt refused to participate stating " I am sleeping, come back later." OT continued to encourage pt but he would not participate. OT to follow up as time allows.   Daneen Schick Taiyo Kozma 02/23/2019, 9:20 AM

## 2019-02-23 NOTE — Progress Notes (Signed)
Physical Therapy Weekly Progress Note  Patient Details  Name: Michael Riggs MRN: [id number] Date of Birth: 1971/05/18  Beginning of progress report period: February 16, 2019 End of progress report period: February 23, 2019  Today's Date: 02/23/2019 PT Individual Time: 0905-1000 PT Individual Time Calculation (min): 55 min   Patient has met 3 of 3 short term goals. Pt has made more steady and consistent progress towards LTGs over last week. He is participating and making every effort to perform functional tasks requested of him. He continues to be primarily limited by significant scrotal and abdominal edema, making upright sitting intolerable for more than a few seconds. He also requires max assist to maintain static sitting 2/2 posterior lean bias w/ edema. Therapy staff has been focusing on edema management w/ OOB activity, global exercises, and use of scrotal sling. As a result, pt requires mod-max assist for bed mobility and slide board transfers. He has performed 2 stand pivots using RW and use of LLE prosthetic w/ min-mod assist, however requires +2 assist for safety 2/2 pt size.   Patient continues to demonstrate the following deficits muscle weakness and muscle joint tightness, decreased cardiorespiratoy endurance and decreased sitting balance, decreased standing balance, decreased postural control and decreased balance strategies and therefore will continue to benefit from skilled PT intervention to increase functional independence with mobility.  Patient progressing toward long term goals..  Continue plan of care. Plan of care remains appropriate given pt's diagnosis and likelihood of rapid progression in function should abdominal and scrotal edema resolve. Pt upgraded to 15/7 therapies 2/2 increased participation.   PT Short Term Goals Week 2:  PT Short Term Goal 1 (Week 2): Pt will perform bed<>chair transfer w/ mod assist PT Short Term Goal 1 - Progress (Week 2): Met PT Short Term  Goal 2 (Week 2): Pt will perform bed mobility w/ mod assist PT Short Term Goal 2 - Progress (Week 2): Met PT Short Term Goal 3 (Week 2): Pt will participate in 60 min of activity PT Short Term Goal 3 - Progress (Week 2): Met Week 3:  PT Short Term Goal 1 (Week 3): =LTGs due to ELOS  Skilled Therapeutic Interventions/Progress Updates:   Pt in supine and agreeable to therapy w/ encouragement, pain in scrotum but does not rate. Pt reports being incontinent of bowel. R/L rolling w/ CGA while therapist performs pericare and brief management. Pt reports he cannot sense when he needs to have a BM. Remainder of session focused on problem solving transfer technique. Multiple supine>sit at EOB w/ mod-max assist, however pt unable to maintain static sitting >5 sec before needing to return to supine 2/2 scrotal pain in sitting. Made multiple attempts at slide board transfer to recliner, however pt unable to boost up enough, even w/ utilizing LLE prosthesis. Discussed using RW for transfer and demonstrated technique to pt. Sit>stand w/ pt pulling up on RW w/ 2 helpers bracing RW. Otherwise no physical assist needed for pt to boost into standing. Stand pivot using RW and extremely wide stance within bariatric RW 2/2 scrotal edema. Both of pt's LEs able to hold his weight and take multiple pivot steps. Min assist x2 for upright balance and RW management. Stand pivot to recliner and reclined back w/ LEs elevated, pain relieved in scrotum. Ended session in recliner, all needs in reach. Pt agreeable to stay up in recliner until next therapy session.   Therapy Documentation Precautions:  Precautions Precautions: Fall, Other (comment) Precaution Comments: L BKA with prosthesis;  significant scrotal edema Restrictions Weight Bearing Restrictions: No Vital Signs: Therapy Vitals Temp: 99.1 F (37.3 C) Temp Source: Oral Pulse Rate: 87 Resp: 16 BP: (!) 159/87 Patient Position (if appropriate): Lying Oxygen  Therapy SpO2: 100 % O2 Device: Room Air Pain: Pain Assessment Pain Scale: 0-10 Pain Score: 0-No pain  Therapy/Group: Individual Therapy  Nico Rogness K Kaleiyah Polsky 02/23/2019, 5:10 PM

## 2019-02-23 NOTE — Progress Notes (Signed)
[name], male   DOB: 09/18/1970, 48 y.o.   MRN: [id number]  [facility] Progress Note   Assessment/ Plan:   1.  Physical debilitation/deconditioning following hospitalization resulting in initiation of hemodialysis: Ongoing physical therapy/Occupational Therapy with inpatient rehabilitation, continues to require coaxing. 2. ESRD: New start to hemodialysis, continue dialysis on a TTS schedule via right IJ TDC as his left first stage BBF matures (created 01/09/2019 by Dr. Donnetta Hutching). Wants to run MTTS this week.  Will eventually be going to [place] TTS 3. Anemia: Hemoglobin/hematocrit appear stable status post PRBC without overt loss, will continue to adjust ESA dosing- on darbe 150 q week and give PRBCs when hemoglobin is <7.0. 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  Do not use phosphate/magnesium based laxatives or enemas. 5. Nutrition: Continue renal diet with renal multivitamin.  6.  Chronic pain: Management per PM&R service.  Subjective:   NO new c/o's - reminded about fluid restriction    Objective:   BP (!) 156/96 (BP Location: Right Arm)   Pulse 80   Temp (!) 97.4 F (36.3 C) (Oral)   Resp 18   Ht 5\' 10"  (1.778 m)   Wt (!) 137 kg   SpO2 97%   BMI 43.34 kg/m   Physical Exam: Gen: Resting in bed, no new c/o's  CVS: Pulse regular rhythm, normal rate, S1 and S2 normal Resp: Anteriorly clear to auscultation, no rales/rhonchi Abd: Soft, obese, nontender Ext: Improving scrotal/thigh edema but still there .  Status post left BKA.  Left BVT with audible bruit  Labs: BMET Recent Labs  Lab 02/16/19 1405 02/18/19 1332 02/20/19 1430 02/21/19 1256  NA 126* 126* 126* 129*  K 4.0 4.1 4.0 4.0  CL 91* 91* 91* 94*  CO2 23 25 23 25   GLUCOSE 143* 124* 140* 144*  BUN 41* 43* 42* 32*  CREATININE 6.76* 6.68* 6.94* 5.45*  CALCIUM 7.7* 7.9*  7.7* 7.7*  PHOS 3.1 3.2 4.0 3.1   CBC Recent Labs  Lab 02/16/19 1400 02/18/19 1332 02/20/19 1430 02/21/19 1256  WBC 12.4* 12.7* 12.7* 12.4*  HGB 7.9* 7.5* 7.3* 7.3*  HCT 26.1* 24.4* 24.6* 24.0*  MCV 96.0 94.9 97.2 94.9  PLT 369 387 435* 399   Medications:    . sodium chloride   Intravenous Once  . atorvastatin  40 mg Oral Daily  . calcitRIOL  0.25 mcg Oral Daily  . calcium carbonate  1 tablet Oral BID WC  . Chlorhexidine Gluconate Cloth  6 each Topical Q0600  . citalopram  40 mg Oral Daily  . collagenase   Topical Daily  . darbepoetin (ARANESP) injection - DIALYSIS  150 mcg Intravenous Q Tue-HD  . gabapentin  300 mg Oral QHS  . heparin  5,000 Units Subcutaneous Q8H  . lidocaine-EPINEPHrine  20 mL Infiltration Once  . Melatonin  4.5 mg Oral QHS  . pantoprazole  40 mg Oral Daily  . polyethylene glycol  17 g Oral Daily  Erma Raiche A Shya Kovatch  02/23/2019, 10:45 AM

## 2019-02-23 NOTE — Progress Notes (Signed)
Pt placed on CPAP tolerating well. 

## 2019-02-23 NOTE — Progress Notes (Signed)
Occupational Therapy Session Note  Patient Details  Name: Michael Riggs MRN: [id number] Date of Birth: [birth year]  Today's Date: 02/23/2019 OT Individual Time: 1030-1100 OT Individual Time Calculation (min): 30 min    Short Term Goals: Week 2:  OT Short Term Goal 1 (Week 2): Pt will tolerate sitting EOB with MIN A for sitting balance for 10 min during functional activity OT Short Term Goal 2 (Week 2): Pt will transfer to [facility] with MOD A of 1 caregiver to decrease caregiver burden OT Short Term Goal 3 (Week 2): Pt will don sock with AE PRN and min VC for technique OT Short Term Goal 4 (Week 2): Pt will bathe with MIN A  Skilled Therapeutic Interventions/Progress Updates:    Pt resting in recliner upon arrival and requested return to bed to prepare for HD. Pt performed sit<>stand with +2 to steady RW but pt able to power up without assistance. Pt required +2 for RW management.  Pt required assistance placing RLE onto bed but able to reposition in bed without assistance. Pt remained in bed with bed alarm activated and all needs within reach.  Therapy Documentation Precautions:  Precautions Precautions: Fall, Other (comment) Precaution Comments: L BKA with prosthesis; significant scrotal edema Restrictions Weight Bearing Restrictions: No Pain:  Pt c/o increased pain in scrotum when sitting upright in chair; repositioned in bed  Therapy/Group: Individual Therapy  Leroy Libman 02/23/2019, 1:47 PM

## 2019-02-23 NOTE — Progress Notes (Signed)
[facility] PHYSICAL MEDICINE & REHABILITATION PROGRESS NOTE   Subjective/Complaints: Patient seen laying in bed this AM.  He states he slept well overnight and feels better.  He notes decrease in edema and pain.  He said he was seen by surgery yesterday and bx performed.   ROS: +Scrotal edema and pain, improving.  Denies CP, shortness of breath, N/V/D  Objective:   No results found. Recent Labs    02/20/19 1430 02/21/19 1256  WBC 12.7* 12.4*  HGB 7.3* 7.3*  HCT 24.6* 24.0*  PLT 435* 399   Recent Labs    02/20/19 1430 02/21/19 1256  NA 126* 129*  K 4.0 4.0  CL 91* 94*  CO2 23 25  GLUCOSE 140* 144*  BUN 42* 32*  CREATININE 6.94* 5.45*  CALCIUM 7.7* 7.7*    Intake/Output Summary (Last 24 hours) at 02/23/2019 1121 Last data filed at 02/23/2019 0730 Gross per 24 hour  Intake 840 ml  Output 300 ml  Net 540 ml     Physical Exam: Vital Signs Blood pressure (!) 156/96, pulse 80, temperature (!) 97.4 F (36.3 C), temperature source Oral, resp. rate 18, height 5\' 10"  (1.778 m), weight (!) 137 kg, SpO2 97 %. Constitutional: No distress . Vital signs reviewed. HENT: Normocephalic. Atraumatic. Eyes: EOMI. No discharge. Cardiovascular: No JVD. Respiratory: Normal effort.  No stridor. GI: Non-distended. Psych: Normal mood.  Normal behavior. Musc: RLE and scrotal edema Neurological: Alert Motor:  RLE: 4-4+/5 proximal to distal, stable (pain inhibition) Left lower extremity: Hip flexion, knee extension 4+/5, unchanged, stable Skin: Left BKA healed.  RLE wound with wounds with dressing c/d/i (please see PA note for pictures) Psychiatric: Normal mood. Normal affect.  Assessment/Plan: 1. Functional deficits secondary to debility which require 3+ hours per day of interdisciplinary therapy in a comprehensive inpatient rehab setting.  Physiatrist is providing close team supervision and 24 hour management of active medical problems listed below.  Physiatrist and rehab team  continue to assess barriers to discharge/monitor patient progress toward functional and medical goals  Care Tool:  Bathing    Body parts bathed by patient: Right arm, Left arm, Chest, Abdomen, Face   Body parts bathed by helper: Front perineal area, Buttocks, Right upper leg, Left upper leg, Right lower leg Body parts n/a: Left lower leg   Bathing assist Assist Level: Maximal Assistance - Patient 24 - 49%     Upper Body Dressing/Undressing Upper body dressing Upper body dressing/undressing activity did not occur (including orthotics): N/A(patient already had a gown) What is the patient wearing?: Hospital gown only    Upper body assist Assist Level: Total Assistance - Patient < 25%    Lower Body Dressing/Undressing Lower body dressing    Lower body dressing activity did not occur: Refused What is the patient wearing?: Hospital gown only     Lower body assist Assist for lower body dressing: Total Assistance - Patient < 25%     Toileting Toileting Toileting Activity did not occur Landscape architect and hygiene only): Safety/medical concerns  Toileting assist Assist for toileting: Dependent - Patient 0%     Transfers Chair/bed transfer  Transfers assist  Chair/bed transfer activity did not occur: Safety/medical concerns  Chair/bed transfer assist level: Moderate Assistance - Patient 50 - 74%(slide board)     Locomotion Ambulation   Ambulation assist   Ambulation activity did not occur: Safety/medical concerns(unable due to pain and scrotal and bladder issues)          Walk 10 feet activity  Assist  Walk 10 feet activity did not occur: Safety/medical concerns(unable due to pain and scrotal and bladder issues)        Walk 50 feet activity   Assist Walk 50 feet with 2 turns activity did not occur: Safety/medical concerns(unable due to pain and scrotal and bladder issues)         Walk 150 feet activity   Assist Walk 150 feet activity did not  occur: Safety/medical concerns(unable due to pain and scrotal and bladder issues)         Walk 10 feet on uneven surface  activity   Assist Walk 10 feet on uneven surfaces activity did not occur: Safety/medical concerns(unable due to pain and scrotal and bladder issues)         Wheelchair     Assist   Type of Wheelchair: Manual Wheelchair activity did not occur: Safety/medical concerns(unable due to pain and scrotal and bladder issues)  Wheelchair assist level: Supervision/Verbal cueing Max wheelchair distance: 25'    Wheelchair 50 feet with 2 turns activity    Assist    Wheelchair 50 feet with 2 turns activity did not occur: Safety/medical concerns(unable due to pain and scrotal and bladder issues)       Wheelchair 150 feet activity     Assist Wheelchair 150 feet activity did not occur: Safety/medical concerns(unable due to pain and scrotal and bladder issues)        Medical Problem List and Plan: 1.Debilitysecondary to end-stage renal disease/multi-medical. Hemodialysis recently initiated.  Continue CIR 2. Antithrombotics: -DVT/anticoagulation:Subcutaneous heparin -antiplatelet therapy: N/A 3. Pain Management:Neurontin 300 mg nightly, Ultram as needed  Stable with medications on 8/6 4. Mood:Celexa 40 mg daily -antipsychotic agents: N/A 5. Neuropsych: This patientiscapable of making decisions on hisown behalf.  Discussed with neuropsychology-appreciate recs, no SI 6. Skin/Wound Care:Scrotal sling Local care to bullae, foam dressings  Appreciate Vasc recs - nonvascular RLE wounds  Appreciate further [place] recs  Surgery performed bx, results ?pending - discussed with surgery 7. Fluids/Electrolytes/Nutrition:Routine in and outs 8.  ESRD:. Status post AV fistula 01/09/2019. Hemodialysis directed. -continue to diurese with HD, recs per nephro Filed Weights   02/21/19 1643 02/22/19 0440  02/23/19 0519  Weight: 133.2 kg 133.5 kg (!) 137 kg   ?Reliability on 8/7 9. Diabetes mellitus. Recent hemoglobin A1c 5.0.   Labile 8/5  Continue to monitor 10. Anemia of chronic disease. Continue Aranesp  Hemoglobin 7.3 on 8/4, recs per nephro, labs with HD 11. History of left BKA 5 years ago in [place].   12. Hypertension. Norvasc 10 mg daily.    Elevated on 8/6 13. Morbid obesity. BMI 43.78. Dietary follow-up 14. Hyperlipidemia. Lipitor 15. History of syphilis. Treated outpatient with penicillin to the healthcare department recently. Outpatient follow-up 16. Urine retention:   Urology finally was able to pass 12 Fr coude cath   Foley to be left in place per urology, discussed with urology, due to ongoing edema and patient insistence, Foley to stay in place with follow-up on 8/10 by urology  UA equivocal, urine culture with insignificant growth 17.  Sleep disturbance  Melatonin 4.5 mg nightly    Cont CPAP 18.  Leukocytosis  WBCs 12.7 on 8/3, labs with HD  Afebrile  Continue to monitor 19.  Constipation  Bowel meds increased on 7/28, decreased on 7/31, decreased on 8/5  LOS: 15 days A FACE TO FACE EVALUATION WAS PERFORMED  Danyella Mcginty Lorie Phenix 02/23/2019, 11:21 AM

## 2019-02-23 NOTE — Progress Notes (Signed)
Patient placed on auto titrate CPAP at this time. Patient is currently on a pressure of 10 and is tolerating well.

## 2019-02-23 NOTE — Final Consult Note (Signed)
Consultant Final Sign-Off Note    Assessment/Final recommendations  Michael Riggs is a 48 y.o. male followed by me for skin biopsy of right lower extremity.    Wound care (if applicable): Can continue Xeroform dressings   Diet at discharge: per primary team   Activity at discharge: per primary team   Follow-up appointment:  PRN   Pending results:  Unresulted Labs (From admission, onward)    Start     Ordered   Signed and Held  Renal function panel  Once,   R    Question:  Specimen collection method  Answer:  IV Team=IV Team collect   Signed and Held   Signed and Held  CBC  Once,   R    Question:  Specimen collection method  Answer:  IV Team=IV Team collect   Signed and Held           Medication recommendations: N/A   Other recommendations: Primary team to follow up on biopsy results    Thank you for allowing [place] to participate in the care of your patient!  Please consult [place] again if you have further needs for your patient.  Barth Kirks [facility] 02/23/2019 8:50 AM    Subjective   On CPAP. No complaints.   Objective  Vital signs in last 24 hours: Temp:  [97.4 F (36.3 C)-99.4 F (37.4 C)] 97.4 F (36.3 C) (08/06 0519) Pulse Rate:  [80-87] 80 (08/06 0519) Resp:  [18] 18 (08/06 0519) BP: (148-156)/(83-96) 156/96 (08/06 0519) SpO2:  [95 %-97 %] 97 % (08/06 0519) Weight:  [id number] kg] 137 kg (08/06 0519)  General: On CPAP MSK: Right lower extremity biopsy site visualized and without signs of bleeding or infection.   Pertinent labs and Studies: Recent Labs    02/20/19 1430 02/21/19 1256  WBC 12.7* 12.4*  HGB 7.3* 7.3*  HCT 24.6* 24.0*   BMET Recent Labs    02/20/19 1430 02/21/19 1256  NA 126* 129*  K 4.0 4.0  CL 91* 94*  CO2 23 25  GLUCOSE 140* 144*  BUN 42* 32*  CREATININE 6.94* 5.45*  CALCIUM 7.7* 7.7*   No results for input(s): LABURIN in the last 72 hours. Results for orders placed or performed during the hospital encounter of 02/08/19   Urine Culture     Status: Abnormal   Collection Time: 02/13/19  1:25 PM   Specimen: Urine, Random  Result Value Ref Range Status   Specimen Description URINE, RANDOM  Final   Special Requests NONE  Final   Culture (A)  Final    <10,000 COLONIES/mL INSIGNIFICANT GROWTH Performed at [facility], [place] [address] [place], [place] [address]    Report Status 02/14/2019 FINAL  Final    Imaging: No results found.

## 2019-02-24 ENCOUNTER — Inpatient Hospital Stay (HOSPITAL_COMMUNITY): Payer: Medicare Other | Admitting: Occupational Therapy

## 2019-02-24 ENCOUNTER — Inpatient Hospital Stay (HOSPITAL_COMMUNITY): Payer: Medicare Other | Admitting: Physical Therapy

## 2019-02-24 ENCOUNTER — Other Ambulatory Visit: Payer: Self-pay

## 2019-02-24 ENCOUNTER — Inpatient Hospital Stay (HOSPITAL_COMMUNITY): Payer: Medicare Other

## 2019-02-24 DIAGNOSIS — L299 Pruritus, unspecified: Secondary | ICD-10-CM

## 2019-02-24 MED ORDER — DIPHENHYDRAMINE-ZINC ACETATE 2-0.1 % EX CREA
TOPICAL_CREAM | Freq: Two times a day (BID) | CUTANEOUS | Status: DC | PRN
  Filled 2019-02-24: qty 28

## 2019-02-24 MED ORDER — FUROSEMIDE 40 MG PO TABS
160.0000 mg | ORAL_TABLET | Freq: Two times a day (BID) | ORAL | Status: DC
  Administered 2019-02-24 – 2019-03-01 (×8): 160 mg via ORAL
  Filled 2019-02-24 (×9): qty 4

## 2019-02-24 MED ORDER — CHLORHEXIDINE GLUCONATE CLOTH 2 % EX PADS: 6.0000 | MEDICATED_PAD | Freq: Every day | CUTANEOUS | Status: DC

## 2019-02-24 NOTE — Progress Notes (Signed)
Occupational Therapy Weekly Progress Note  Patient Details  Name: Michael Riggs MRN: [id number] Date of Birth: 1970/11/08  Beginning of progress report period: February 09, 2019 End of progress report period: February 24, 2019  Today's Date: 02/24/2019 OT Individual Time: 2919-1660 OT Individual Time Calculation (min): 43 min   Patient has met 0 of 4 short term goals.  Pt has made progress this week, despite not meeting his short term goals. Pt has tolerated being out of bed in the recliner more this week, although he can only tolerate being out of bed for short periods of time 2/2 scrotal edema and R LE pain. Pt has had difficulty maintaining compression on scrotum 2/2 bowel incontinence and it coming off when he moves. Pt has been able to tolerate leaning forward a little more this week within functional tasks. He completed stand-pivot transfer with RW and +2 assist one day this week, but at times, is unable to don L prosthesis 2/2 swelling, so the maximove is still needed to transfer him when he can't don his prosthesis. Mental status has greatly improved and pt is more willing to participate in therapies.  Patient continues to demonstrate the following deficits: muscle weakness, decreased cardiorespiratoy endurance and decreased sitting balance, decreased standing balance, decreased postural control and decreased balance strategies and therefore will continue to benefit from skilled OT intervention to enhance overall performance with BADL and Reduce care partner burden.  Patient not progressing toward long term goals.  See goal revision..  Plan of care revisions: Goals downgraded to min A overall.  OT Short Term Goals Week 2:  OT Short Term Goal 1 (Week 2): Pt will tolerate sitting EOB with MIN A for sitting balance for 10 min during functional activity OT Short Term Goal 1 - Progress (Week 2): Progressing toward goal OT Short Term Goal 2 (Week 2): Pt will transfer to [facility] with MOD A of 1  caregiver to decrease caregiver burden OT Short Term Goal 2 - Progress (Week 2): Progressing toward goal OT Short Term Goal 3 (Week 2): Pt will don sock with AE PRN and min VC for technique OT Short Term Goal 3 - Progress (Week 2): Progressing toward goal OT Short Term Goal 4 (Week 2): Pt will bathe with MIN A OT Short Term Goal 4 - Progress (Week 2): Progressing toward goal Week 3:  OT Short Term Goal 1 (Week 3): Pt will tolerate sitting in wc or recliner for 1 hour in between therapy sessions OT Short Term Goal 2 (Week 3): Pt will complete transfer to commode with LRAD and Mod A of 1 caregiver OT Short Term Goal 3 (Week 3): Pt will tolerate leaning forward at the sink to shave OT Short Term Goal 4 (Week 3): Pt will tolerate wearing scrotal sling during transfer.  Skilled Therapeutic Interventions/Progress Updates:      Pt greeted sitting in recliner and agreeable to OT treatment session. Pt requests to go to the sink in recliner to shave. Pt with improved forward weight shift and anterior leaning tolerance today to reach the sink on 7/10 opportunities. Pt declined bathing at the sink, but agreeable to don new gown with set-up A. Pt requests to return to bed s/p shaving. Discussed options for transfer including slideboard transfer. OT attempted to don L prosthesis, but L LE too swollen for prosthesis to click into place. Pt declined slideboard transfer despite max encourgement. Only agreeable to using the lift. Discussed use of lift at home and how this  would greatly increase burden of care upon family. Discussed also that he would have to leave the house multiple times and week for dialysis, and logistically how this would work. Pt stated " I don;t know if my brother could do this." Discussed dc options and possible need for SNF for continued rehabilitation s/p CIR stay, pt agreed. Will discuss options with care team. Maximove transfer back to bed dependently. Pt left semi-reclined in bed with needs  met.   Therapy Documentation Precautions:  Precautions Precautions: Fall, Other (comment) Precaution Comments: L BKA with prosthesis; significant scrotal edema Restrictions Weight Bearing Restrictions: No Pain: Pain Assessment Pain Scale: 0-10 Pain Score: 8  Pain Type: Acute pain Pain Location: Leg Pain Orientation: Right Pain Radiating Towards: scrotum  Pain Descriptors / Indicators: Aching;Discomfort Pain Frequency: Constant Pain Onset: On-going Pain Intervention(s): Repositioned;Rest  Therapy/Group: Individual Therapy  Valma Cava 02/24/2019, 12:33 PM

## 2019-02-24 NOTE — Progress Notes (Signed)
Occupational Therapy Session Note  Patient Details  Name: Michael Riggs MRN: [id number] Date of Birth: [birth year]  Today's Date: 02/24/2019 OT Individual Time: 0930-1000 OT Individual Time Calculation (min): 30 min    Short Term Goals: Week 2:  OT Short Term Goal 1 (Week 2): Pt will tolerate sitting EOB with MIN A for sitting balance for 10 min during functional activity OT Short Term Goal 2 (Week 2): Pt will transfer to [facility] with MOD A of 1 caregiver to decrease caregiver burden OT Short Term Goal 3 (Week 2): Pt will don sock with AE PRN and min VC for technique OT Short Term Goal 4 (Week 2): Pt will bathe with MIN A  Skilled Therapeutic Interventions/Progress Updates:    Pt resting in bed upon arrival. OT intervention with focus on bed mobility and transfer to recliner.  Pt very direct in directing care and requesting assistance, often using very colorful language.  Pt directed donning prosthesis.  Pt required max A for supine>sit EOB to don prosthesis.  Pt required max A for squat pivot to recliner.  Pt required assistance repositioning in recliner.  Pt remained in recliner with all needs within reach.   Therapy Documentation Precautions:  Precautions Precautions: Fall, Other (comment) Precaution Comments: L BKA with prosthesis; significant scrotal edema Restrictions Weight Bearing Restrictions: No   Pain: Pain Assessment Pain Scale: 0-10 Pain Score: 8  Pain Type: Acute pain Pain Location: Leg Pain Orientation: Right Pain Radiating Towards: scrotum  Pain Descriptors / Indicators: Aching;Discomfort Pain Frequency: Constant Pain Onset: On-going Pain Intervention(s): Repositioned;Rest   Therapy/Group: Individual Therapy  Leroy Libman 02/24/2019, 10:39 AM

## 2019-02-24 NOTE — Plan of Care (Signed)
  Problem: RH Dressing Goal: LTG Patient will perform lower body dressing w/assist (OT) Description: LTG: Patient will perform lower body dressing with assist, with/without cues in positioning using equipment (OT) Outcome: Not Applicable Flowsheets (Taken 02/24/2019 1251) LTG: Pt will perform lower body dressing with assistance level of: (d/c goal 8/7) -- Note: Dc goal 8/7 2/2 lack of progress and pt unable to wear pants 2/2 scrotal edema   Problem: RH Tub/Shower Transfers Goal: LTG Patient will perform tub/shower transfers w/assist (OT) Description: LTG: Patient will perform tub/shower transfers with assist, with/without cues using equipment (OT) Outcome: Not Applicable Flowsheets (Taken 02/24/2019 1251) LTG: Pt will perform tub/shower stall transfers with assistance level of: (dc goal 8/7) -- Note: D/c goal 8/7 2/2 pt unable to shower with dialysis port-ESD

## 2019-02-24 NOTE — Progress Notes (Signed)
[name], male   DOB: [birth year], 48 y.o.   MRN: [id number]  [facility] Progress Note   Assessment/ Plan:   1.  Physical debilitation/deconditioning following hospitalization resulting in initiation of hemodialysis: Ongoing physical therapy/Occupational Therapy with inpatient rehabilitation, continues to require coaxing. 2. ESRD: New start to hemodialysis, continue dialysis on a TTS schedule via right IJ TDC as his left first stage BBF matures (created 01/09/2019 by Dr. Donnetta Hutching). Wants to run MTTS this week- may need to do next week as well ?  Will eventually be going to [place] TTS.  Take off a lot with each treatment but dont really get anywhere- he claims compliance with fluid restriction but something isnt adding up- just third spacing I guess   3. Anemia: Hemoglobin/hematocrit appear stable status post PRBC without overt loss, will continue to adjust ESA dosing- on darbe 150 q week and give PRBCs when hemoglobin is <7.0. 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  Do not use phosphate/magnesium based laxatives or enemas. 5. Nutrition: Continue renal diet with renal multivitamin.  6.  Chronic pain: Management per PM&R service. 7. BOO-  Foley in place-  Followed by urology- still making some urine - will try some lasix   Subjective:   HD yest- removed 3500   Objective:   BP (!) 147/83 (BP Location: Right Arm)   Pulse 87   Temp 98 F (36.7 C)   Resp 18   Ht 5\' 10"  (1.778 m)   Wt (!) 136.6 kg   SpO2 95%   BMI 43.21 kg/m   Physical Exam: Gen: Resting in bed, no new c/o's  CVS: Pulse regular rhythm, normal rate, S1 and S2 normal Resp: Anteriorly clear to auscultation, no rales/rhonchi Abd: Soft, obese, nontender Ext: Improving scrotal/thigh edema but still there .  Status post left BKA.  Left BVT with audible bruit  Labs: BMET Recent Labs   Lab 02/18/19 1332 02/20/19 1430 02/21/19 1256 02/23/19 1338  NA 126* 126* 129* 125*  K 4.1 4.0 4.0 4.6  CL 91* 91* 94* 90*  CO2 25 23 25 24   GLUCOSE 124* 140* 144* 133*  BUN 43* 42* 32* 36*  CREATININE 6.68* 6.94* 5.45* 6.18*  CALCIUM 7.9* 7.7* 7.7* 7.9*  PHOS 3.2 4.0 3.1 3.6   CBC Recent Labs  Lab 02/18/19 1332 02/20/19 1430 02/21/19 1256 02/23/19 1338  WBC 12.7* 12.7* 12.4* 13.7*  HGB 7.5* 7.3* 7.3* 7.5*  HCT 24.4* 24.6* 24.0* 24.7*  MCV 94.9 97.2 94.9 95.4  PLT 387 435* 399 402*   Medications:    . sodium chloride   Intravenous Once  . atorvastatin  40 mg Oral Daily  . calcitRIOL  0.25 mcg Oral Daily  . calcium carbonate  1 tablet Oral BID WC  . Chlorhexidine Gluconate Cloth  6 each Topical Q0600  . citalopram  40 mg Oral Daily  . collagenase   Topical Daily  . darbepoetin (ARANESP) injection - DIALYSIS  150 mcg Intravenous Q Tue-HD  . gabapentin  300 mg Oral QHS  . heparin  5,000 Units Subcutaneous Q8H  . lidocaine-EPINEPHrine  20 mL Infiltration Once  . Melatonin  4.5 mg Oral QHS  . pantoprazole  40 mg Oral Daily  . polyethylene glycol  17 g Oral Daily  Michael Riggs A Michael Riggs  02/24/2019, 11:36 AM

## 2019-02-24 NOTE — Patient Outreach (Signed)
[facility]) Care Management  02/24/2019  Dani Mikrut 12-24-1970 [id number]   Medication Adherence call to Mr. Altonio Kuznia HIPPA Compliant Voice message left with a call back number. Mr. Scoggins is showing past due on Victoza under [place].  [facility] Management Direct Dial [phone]  Fax [phone] [email or web address]

## 2019-02-24 NOTE — Progress Notes (Addendum)
[place] PHYSICAL MEDICINE & REHABILITATION PROGRESS NOTE   Subjective/Complaints: Patient seen sitting up in bed this morning.  He states he slept fairly well overnight.  He is upset about his breakfast being incorrect and then delayed this morning.  He notes decreasing edema.  ROS: +Scrotal edema and pain in right leg pain, pruritus on back.  Denies CP, shortness of breath, N/V/D  Objective:   No results found. Recent Labs    02/21/19 1256 02/23/19 1338  WBC 12.4* 13.7*  HGB 7.3* 7.5*  HCT 24.0* 24.7*  PLT 399 402*   Recent Labs    02/21/19 1256 02/23/19 1338  NA 129* 125*  K 4.0 4.6  CL 94* 90*  CO2 25 24  GLUCOSE 144* 133*  BUN 32* 36*  CREATININE 5.45* 6.18*  CALCIUM 7.7* 7.9*    Intake/Output Summary (Last 24 hours) at 02/24/2019 0919 Last data filed at 02/24/2019 0506 Gross per 24 hour  Intake 240 ml  Output 3705 ml  Net -3465 ml     Physical Exam: Vital Signs Blood pressure (!) 147/83, pulse 87, temperature 98 F (36.7 C), resp. rate 18, height 5\' 10"  (1.778 m), weight (!) 136.6 kg, SpO2 95 %. Constitutional: No distress . Vital signs reviewed. HENT: Normocephalic.  Atraumatic. Eyes: EOMI. No discharge. Cardiovascular: No JVD. Respiratory: Normal effort.  No stridor. GI: Non-distended. Skin: Right lower extremity with dressing C/D/I Left BKA healed No rash on back Psych: Normal mood.  Normal behavior. Musc: Scrotal edema Neurological: Alert Motor:  RLE: 4-4+/5 proximal to distal, unchanged (pain inhibition) Left lower extremity: Hip flexion, knee extension 4+/5, unchanged, unchanged  Assessment/Plan: 1. Functional deficits secondary to debility which require 3+ hours per day of interdisciplinary therapy in a comprehensive inpatient rehab setting.  Physiatrist is providing close team supervision and 24 hour management of active medical problems listed below.  Physiatrist and rehab team continue to assess barriers to discharge/monitor patient  progress toward functional and medical goals  Care Tool:  Bathing    Body parts bathed by patient: Right arm, Left arm, Chest, Abdomen, Face   Body parts bathed by helper: Front perineal area, Buttocks, Right upper leg, Left upper leg, Right lower leg Body parts n/a: Left lower leg   Bathing assist Assist Level: Maximal Assistance - Patient 24 - 49%     Upper Body Dressing/Undressing Upper body dressing Upper body dressing/undressing activity did not occur (including orthotics): N/A(patient already had a gown) What is the patient wearing?: Hospital gown only    Upper body assist Assist Level: Total Assistance - Patient < 25%    Lower Body Dressing/Undressing Lower body dressing    Lower body dressing activity did not occur: Refused What is the patient wearing?: Hospital gown only     Lower body assist Assist for lower body dressing: Total Assistance - Patient < 25%     Toileting Toileting Toileting Activity did not occur Landscape architect and hygiene only): Safety/medical concerns  Toileting assist Assist for toileting: Dependent - Patient 0%     Transfers Chair/bed transfer  Transfers assist  Chair/bed transfer activity did not occur: Safety/medical concerns  Chair/bed transfer assist level: 2 Helpers     Locomotion Ambulation   Ambulation assist   Ambulation activity did not occur: Safety/medical concerns(unable due to pain and scrotal and bladder issues)          Walk 10 feet activity   Assist  Walk 10 feet activity did not occur: Safety/medical concerns(unable due to pain and  scrotal and bladder issues)        Walk 50 feet activity   Assist Walk 50 feet with 2 turns activity did not occur: Safety/medical concerns(unable due to pain and scrotal and bladder issues)         Walk 150 feet activity   Assist Walk 150 feet activity did not occur: Safety/medical concerns(unable due to pain and scrotal and bladder issues)         Walk  10 feet on uneven surface  activity   Assist Walk 10 feet on uneven surfaces activity did not occur: Safety/medical concerns(unable due to pain and scrotal and bladder issues)         Wheelchair     Assist   Type of Wheelchair: Manual Wheelchair activity did not occur: Safety/medical concerns(unable due to pain and scrotal and bladder issues)  Wheelchair assist level: Supervision/Verbal cueing Max wheelchair distance: 25'    Wheelchair 50 feet with 2 turns activity    Assist    Wheelchair 50 feet with 2 turns activity did not occur: Safety/medical concerns(unable due to pain and scrotal and bladder issues)       Wheelchair 150 feet activity     Assist Wheelchair 150 feet activity did not occur: Safety/medical concerns(unable due to pain and scrotal and bladder issues)        Medical Problem List and Plan: 1.Debilitysecondary to end-stage renal disease/multi-medical. Hemodialysis recently initiated.  Continue CIR 2. Antithrombotics: -DVT/anticoagulation:Subcutaneous heparin -antiplatelet therapy: N/A 3. Pain Management:Neurontin 300 mg nightly, Ultram as needed  Stable with medications on 8/7 4. Mood:Celexa 40 mg daily -antipsychotic agents: N/A  Overall poor coping mechanisms, cont to attempt to provide support 5. Neuropsych: This patientiscapable of making decisions on hisown behalf.  Discussed with neuropsychology-appreciate recs, no SI 6. Skin/Wound Care:Scrotal sling Local care to bullae, foam dressings  Appreciate Vasc recs - nonvascular RLE wounds  Appreciate further [place] recs  Surgery performed bx, results ?pending - discussed with surgery previously 7. Fluids/Electrolytes/Nutrition:Routine in and outs 8.  ESRD:. Status post AV fistula 01/09/2019. Hemodialysis directed. -continue to diurese with HD, recs per nephro Filed Weights   02/23/19 1240 02/23/19 1645 02/24/19 0506  Weight:  (!) 137.8 kg 135.7 kg (!) 136.6 kg   Relatively stable on 8/7 9. Diabetes mellitus. Recent hemoglobin A1c 5.0.   Elevated on 8/6  Continue to monitor 10. Anemia of chronic disease. Continue Aranesp  Hemoglobin 7.5 on 8/6, recs per nephro, labs with HD 11. History of left BKA 5 years ago in [place].   12. Hypertension. Norvasc 10 mg daily.    Slightly elevated on 8/6 13. Morbid obesity. BMI 43.78. Dietary follow-up 14. Hyperlipidemia. Lipitor 15. History of syphilis. Treated outpatient with penicillin to the healthcare department recently. Outpatient follow-up 16. Urine retention:   Urology finally was able to pass 12 Fr coude cath   Foley to be left in place per urology, discussed with urology, due to ongoing edema and patient insistence, Foley to stay in place with follow-up on 8/10 by urology  UA equivocal, urine culture with insignificant growth 17.  Sleep disturbance  Melatonin 4.5 mg nightly    Cont CPAP 18.  Leukocytosis  WBCs 13.7 on 8/6, labs with HD  Afebrile  Continue to monitor 19.  Constipation  Bowel meds increased on 7/28, decreased on 7/31, decreased on 8/5 20.  Pruritus  Hygiene for back  Benadryl cream ordered  LOS: 16 days A FACE TO FACE EVALUATION WAS PERFORMED  Michael Riggs Michael Riggs  02/24/2019, 9:19 AM

## 2019-02-24 NOTE — Patient Care Conference (Signed)
Inpatient RehabilitationTeam Conference and Plan of Care Update Date: 02/22/2019   Time: 11:40 AM    Patient Name: Michael Riggs      Medical Record Number: [id number]  Date of Birth: June 28, 1971 Sex: Male         Room/Bed: 4M03C/4M03C-01 Payor Info: Payor: [employer] scientist / Plan: [employer] MEDICARE / Product Type: *No Product type* /    Admitting Diagnosis: 2. ABI Team  Debility, malaise; 17-19days  Admit Date/Time:  02/08/2019 11:54 PM Admission Comments: No comment available   Primary Diagnosis:  <principal problem not specified> Principal Problem: <principal problem not specified>  Patient Active Problem List   Diagnosis Date Noted  . Pruritus   . Scrotal pain   . Labile blood pressure   . Scrotal edema   . Status post below-knee amputation of left lower extremity ([place])   . Slow transit constipation   . Leukocytosis   . Sleep disturbance   . Essential hypertension   . Anemia of chronic disease   . Controlled type 2 diabetes mellitus with hyperglycemia, without long-term current use of insulin ([place])   . ESRD on dialysis ([place])   . Urinary retention   . Debility 02/09/2019  . Fluid overload 02/04/2019  . Hyperkalemia 01/30/2019  . ARF (acute renal failure) ([place]) 01/30/2019  . Hypokalemia 01/30/2019  . Hypoglycemia 01/30/2019  . Syphilis 01/30/2019  . Macrocytic anemia 01/30/2019  . End stage renal disease ([place])   . PDR (proliferative diabetic retinopathy) ([place]) 12/14/2013  . Venous hypertension 09/22/2013  . Diabetes mellitus type 2 in obese ([place]) 07/04/2013  . Habitual alcohol use 07/04/2013  . Obesity, Class II, BMI 35-39.9, with comorbidity 07/04/2013    Expected Discharge Date: Expected Discharge Date: 03/02/19  Team Members Present: Physician leading conference: Dr. Delice Lesch Social Worker Present: Lennart Pall, LCSW Nurse Present: Judee Clara, LPN PT Present: Burnard Bunting, PT OT Present: Cherylynn Ridges, OT SLP Present: Weston Anna, SLP PPS  Coordinator present : Gunnar Fusi, SLP     Current Status/Progress Goal Weekly Team [employer] secondary to end-stage renal disease/multi-medical.  Hemodialysis recently initiated.  Improve mobility, pain, cognition, lethargy, DM, HTN, leukocytosis, constipation, ESRD, urinary retention, wounds, scrotal edema  See above   Bowel/Bladder   Patient is cont/incont of bowel. LBM 02/24/19. Pt has foley cath small amount of output 276ml 7p-7a shift  pt will be continent of bowel/bladder  proper foley care will continue to assess  qshift/prn   Swallow/Nutrition/ Hydration             ADL's   Continues to be Max A for BADLs, participation improving, change to 15/7  Min A wc level  bed mobility, transfers, scrotal edema, self-care retraining, scrotal edema management, dc planning   Mobility   mod-max assist overall, stand pivot or slide board transfers, remains limited by scrotal edema causing a posterior lean bias w/ all activity and decreased upright tolerance, participation slowly improving  CGA-S at w/c level  OOB tolerance, bed mobility and transfers, BLE strengthening and LLE prosthetic use   Communication             Safety/Cognition/ Behavioral Observations            Pain   Pt c/o of pain 10/10 to Rt leg and scrotum.  Pt pain level <3  Assess pain qshift/prn   Skin   Pt has blisters/dry skin/ulcers on RLE. Daily dressing change with santyl.  prevent further skin breakdown and prevent  infection  assess skin qshift/prn    Rehab Goals Rehab Goals Revised: swelling continues to interfere with pt's ability to do therapy *See Care Plan and progress notes for long and short-term goals.     Barriers to Discharge  Current Status/Progress Possible Resolutions Date Resolved   Physician    Medical stability;Weight;Other (comments)  Hx of left BKA  See above  Therapies, optimize DM/HTN meds, optimize bowel meds, bx of wounds, Urology recs      Nursing                  PT                     OT                  SLP                SW                Discharge Planning/Teaching Needs:  Pt to return home with mother who is unable to assist.  Brother and sister-in-law are now in the home as well and able to provide 24/7 assistance.  Teaching needs TBD.   Team Discussion:  Some behavioral issues continue but better participation overall.  Plans to remove foley next week?  Pt resistent to having this removed and potentially will d/c with foley per MD.  Cont bowel.  Mod assist with transfers.  Inconsistent with wearing CPAP.  At this point, tx keeping CGA w/c level goals.  Still addressing scrotal edema/ management.    Revisions to Treatment Plan:  NA    Continued Need for Acute Rehabilitation Level of Care: The patient requires daily medical management by a physician with specialized training in physical medicine and rehabilitation for the following conditions: Daily direction of a multidisciplinary physical rehabilitation program to ensure safe treatment while eliciting the highest outcome that is of practical value to the patient.: Yes Daily medical management of patient stability for increased activity during participation in an intensive rehabilitation regime.: Yes Daily analysis of laboratory values and/or radiology reports with any subsequent need for medication adjustment of medical intervention for : Renal problems;Urological problems;Diabetes problems;Blood pressure problems;Mood/behavior problems;Other   I attest that I was present, lead the team conference, and concur with the assessment and plan of the team.   [name], Janeen Watson 02/24/2019, 12:20 PM    Team conference was held via web/ teleconference due to [place] - 19

## 2019-02-24 NOTE — Progress Notes (Signed)
Physical Therapy Session Note  Patient Details  Name: Dayyan Sahin MRN: [id number] Date of Birth: 27-Jan-1971  Today's Date: 02/24/2019 PT Individual Time: 1120-1130 AND 1255-1340 PT Individual Time Calculation (min): 10 min AND 45 min  Short Term Goals: Week 3: PT Short Term Goals = LTGs due to ELOS   Skilled Therapeutic Interventions/Progress Updates:   Session 1:  Pt received in supine, unwilling to participate in therapy at this time, agreeable to try OOB activity during afternoon session. Pt c/o increasing L residual limb swelling today, ongoing education regarding swelling management and importance of using stump shrinker at all times. Pt immediately stating it was not his fault that he didn't have it on last night. Encouraged pt to take ownership on his medical care and advocate for himself, and to ask nursing to put it on for him when necessary. Pt verbalized understanding and in agreement.   Session 2:  Pt in supine and continued to decline OOB activity 2/2 L residual limb swelling/pain. Had prolonged discussion regarding discharge planning and education of his deficits. Pt agreeable to SNF option, however agrees that he would not be able to tolerate sitting fully upright for transport to/from dialysis. Therapist reminded him that he would need to do that at home too when going to/from dialysis. At this time, pt simply does not tolerate any sitting of fully upright as in a w/c. Pt continues to state that we are pushing him too hard and asking too much of him, unwilling to push through increased pain even briefly while sitting up. Discussed making achievable, but short term goals for him to work on daily or weekly in order to reach a safe functional status to go home. This primarily has to do with decreasing scrotal/abdominal swelling or increasing his tolerance to upright sitting. This therapist and pt made plan together that would including a goal to bring HOB up to 60 deg for 1  hour x4 daily, sit EOB x2 daily, wear stump shrinker at all times, and to perform LE exercises throughout the day including ankle pumps, heel slides, and SLRs. Pt agreeable to this. Ended session in supine, all needs in reach.   Therapy Documentation Precautions:  Precautions Precautions: Fall, Other (comment) Precaution Comments: L BKA with prosthesis; significant scrotal edema Restrictions Weight Bearing Restrictions: No Pain: Pain Assessment Pain Scale: 0-10 Pain Score: 10-Worst pain ever Pain Type: Acute pain Pain Location: Leg Pain Orientation: Right Pain Radiating Towards: scrotum Pain Descriptors / Indicators: Aching;Discomfort Pain Frequency: Constant Pain Onset: On-going Pain Intervention(s): Medication (See eMAR)  Therapy/Group: Individual Therapy  Raysa Bosak K Edlyn Rosenburg 02/24/2019, 10:26 AM

## 2019-02-24 NOTE — Progress Notes (Signed)
Social Work [name], male   DOB: Dec 04, 1970, 48 y.o.   MRN: [id number]  Met with pt today to discuss current functional status and conference discussions.  Per therapy reports, pt is actively participating in sessions, however, the swelling continues to be a major inhibitor with any mobility.  Swelling may be slightly down but still a major issue.  There had been some discussion between therapies and pt this morning about possible SNF placement to allow more time for swelling to decrease.  I have discussed with all, however, that the current issues will still be there for a SNF as pt would have to be able to sit up in a w/c for transport to the HD center.  He would need to be able to transfer to a recliner once at the center and stay up for treatment session.  I will follow up with the HD SW coordinator to further discuss the set up at the [facility] site just to make sure I am not making incorrect assumptions and will update team as needed.   Pt very much wishes to have HD tx here 5 days/ week and reports he has discussed this with Renal MD.  Will plan to follow up with HD SW and again with pt on Monday.  Lark Langenfeld, LCSW

## 2019-02-25 ENCOUNTER — Inpatient Hospital Stay (HOSPITAL_COMMUNITY): Payer: Medicare Other | Admitting: Physical Therapy

## 2019-02-25 ENCOUNTER — Inpatient Hospital Stay (HOSPITAL_COMMUNITY): Payer: Medicare Other | Admitting: Occupational Therapy

## 2019-02-25 LAB — RENAL FUNCTION PANEL
Albumin: 1.6 g/dL — ABNORMAL LOW (ref 3.5–5.0)
Anion gap: 12 (ref 5–15)
BUN: 40 mg/dL — ABNORMAL HIGH (ref 6–20)
CO2: 23 mmol/L (ref 22–32)
Calcium: 7.9 mg/dL — ABNORMAL LOW (ref 8.9–10.3)
Chloride: 91 mmol/L — ABNORMAL LOW (ref 98–111)
Creatinine, Ser: 6.13 mg/dL — ABNORMAL HIGH (ref 0.61–1.24)
GFR calc Af Amer: 12 mL/min — ABNORMAL LOW (ref >60)
GFR calc non Af Amer: 10 mL/min — ABNORMAL LOW (ref >60)
Glucose, Bld: 135 mg/dL — ABNORMAL HIGH (ref 70–99)
Phosphorus: 3.9 mg/dL (ref 2.5–4.6)
Potassium: 4.3 mmol/L (ref 3.5–5.1)
Sodium: 126 mmol/L — ABNORMAL LOW (ref 135–145)

## 2019-02-25 LAB — CBC
HCT: 24.8 % — ABNORMAL LOW (ref 39.0–52.0)
Hemoglobin: 7.6 g/dL — ABNORMAL LOW (ref 13.0–17.0)
MCH: 28.6 pg (ref 26.0–34.0)
MCHC: 30.6 g/dL (ref 30.0–36.0)
MCV: 93.2 fL (ref 80.0–100.0)
Platelets: 380 10*3/uL (ref 150–400)
RBC: 2.66 MIL/uL — ABNORMAL LOW (ref 4.22–5.81)
RDW: 13.7 % (ref 11.5–15.5)
WBC: 13.3 10*3/uL — ABNORMAL HIGH (ref 4.0–10.5)
nRBC: 0 % (ref 0.0–0.2)

## 2019-02-25 MED ORDER — HEPARIN SODIUM (PORCINE) 1000 UNIT/ML IJ SOLN
INTRAMUSCULAR | Status: AC
  Filled 2019-02-25: qty 2

## 2019-02-25 MED ORDER — HEPARIN SODIUM (PORCINE) 1000 UNIT/ML IJ SOLN
INTRAMUSCULAR | Status: AC
  Administered 2019-02-25: 1000 [IU]
  Filled 2019-02-25: qty 4

## 2019-02-25 NOTE — Progress Notes (Signed)
Physical Therapy Session Note  Patient Details  Name: Michael Riggs MRN: [id number] Date of Birth: March 22, 1971  Today's Date: 02/25/2019 PT Individual Time: 0900-0945 PT Individual Time Calculation (min): 45 min   Short Term Goals: Week 3:  PT Short Term Goal 1 (Week 3): =LTGs due to ELOS  Skilled Therapeutic Interventions/Progress Updates:    Pt received seated in bed, agreeable with encouragement to participate in therapy session. Pt reports 10/10 pain in scrotum. Encouraged pt to perform sit to stands at EOB, pt declines to perform standing unless he is able to don his scrotal sling. Pt declines to don sling that is in his room because it is too small, per primary PT his OT is working on obtaining material for a larger sling. Pt agreeable to come to EOB. Supine to sit with min A for trunk control with HOB maximally elevated. Pt tolerates sitting EOB x 10 sec then returns to supine with min A for RLE management. Pt declines any further bed mobility. Pt agreeable to bed level therex. Supine hip abd and quad sets 3 x 10 reps B. Pt left semi-reclined in bed with needs in reach at end of session.  Therapy Documentation Precautions:  Precautions Precautions: Fall, Other (comment) Precaution Comments: L BKA with prosthesis; significant scrotal edema Restrictions Weight Bearing Restrictions: No    Therapy/Group: Individual Therapy   Excell Seltzer, PT, DPT  02/25/2019, 12:45 PM

## 2019-02-25 NOTE — Progress Notes (Signed)
Occupational Therapy Session Note  Patient Details  Name: Darran Franklin MRN: [id number] Date of Birth: 07/23/1970  Today's Date: 02/25/2019 OT Individual Time: 1045-1110 OT Individual Time Calculation (min): 25 min  50 minutes missed due to HD  Skilled Therapeutic Interventions/Progress Updates:    Pt greeted in bed with a pain rating of "10.5". RN notified and in to provide pain medicine during session. He described at length his physical limitations, including how scrotal pain impedes function. Adamant that he would not attempt transfer OOB. Pt requested bedlevel therapy and was agreeable to brush his teeth. Pt able to tolerate HOB elevation to 51 degrees to complete oral care with setup and increased time. He reported his record so far has been tolerating HOB elevation to 45 degrees. At this time HD transport came to take him to HD. Therefore tx time missed.   Therapy Documentation Precautions:  Precautions Precautions: Fall, Other (comment) Precaution Comments: L BKA with prosthesis; significant scrotal edema Restrictions Weight Bearing Restrictions: No ADL: ADL Eating: Independent Grooming: Setup Upper Body Bathing: Minimal assistance Where Assessed-Upper Body Bathing: Bed level Lower Body Bathing: Maximal assistance Where Assessed-Lower Body Bathing: Bed level Upper Body Dressing: Moderate assistance Where Assessed-Upper Body Dressing: Bed level Toileting: Not assessed Toilet Transfer: Not assessed     Therapy/Group: Individual Therapy  Cardale Dorer A Yasira Engelson 02/25/2019, 11:21 AM

## 2019-02-25 NOTE — Progress Notes (Signed)
[facility] PHYSICAL MEDICINE & REHABILITATION PROGRESS NOTE   Subjective/Complaints: C/o of back rash and ongoing pain in leg and scrotum.   ROS: Patient denies fever, rash, sore throat, blurred vision, nausea, vomiting, diarrhea, cough, shortness of breath or chest pain,   headache, or mood change.    Objective:   No results found. Recent Labs    02/23/19 1338  WBC 13.7*  HGB 7.5*  HCT 24.7*  PLT 402*   Recent Labs    02/23/19 1338  NA 125*  K 4.6  CL 90*  CO2 24  GLUCOSE 133*  BUN 36*  CREATININE 6.18*  CALCIUM 7.9*    Intake/Output Summary (Last 24 hours) at 02/25/2019 0820 Last data filed at 02/24/2019 1922 Gross per 24 hour  Intake 682 ml  Output 200 ml  Net 482 ml     Physical Exam: Vital Signs Blood pressure (!) 135/93, pulse 87, temperature 98.2 F (36.8 C), temperature source Oral, resp. rate 18, height 5\' 10"  (1.778 m), weight (!) 137 kg, SpO2 97 %. Constitutional: No distress . Vital signs reviewed. HEENT: EOMI, oral membranes moist Neck: supple Cardiovascular: RRR without murmur. No JVD    Respiratory: CTA Bilaterally without wheezes or rales. Normal effort    GI: BS +, non-tender, non-distended . Skin: Right lower extremity with dressing C/D/I Left BKA healed   Psych: Normal mood.  Normal behavior. Musc: Scrotal edema ongoing Neurological: Alert Motor:  RLE: 4-4+/5 proximal to distal, unchanged (pain inhibition) Left lower extremity: Hip flexion, knee extension 4+/5, unchanged, unchanged Psych: anxious   Assessment/Plan: 1. Functional deficits secondary to debility which require 3+ hours per day of interdisciplinary therapy in a comprehensive inpatient rehab setting.  Physiatrist is providing close team supervision and 24 hour management of active medical problems listed below.  Physiatrist and rehab team continue to assess barriers to discharge/monitor patient progress toward functional and medical goals  Care Tool:  Bathing    Body  parts bathed by patient: Right arm, Left arm, Chest, Abdomen, Face   Body parts bathed by helper: Front perineal area, Buttocks, Right upper leg, Left upper leg, Right lower leg Body parts n/a: Left lower leg   Bathing assist Assist Level: Maximal Assistance - Patient 24 - 49%     Upper Body Dressing/Undressing Upper body dressing Upper body dressing/undressing activity did not occur (including orthotics): N/A(patient already had a gown) What is the patient wearing?: Hospital gown only    Upper body assist Assist Level: Total Assistance - Patient < 25%    Lower Body Dressing/Undressing Lower body dressing    Lower body dressing activity did not occur: Refused What is the patient wearing?: Hospital gown only     Lower body assist Assist for lower body dressing: Total Assistance - Patient < 25%     Toileting Toileting Toileting Activity did not occur Landscape architect and hygiene only): Safety/medical concerns  Toileting assist Assist for toileting: Dependent - Patient 0%     Transfers Chair/bed transfer  Transfers assist  Chair/bed transfer activity did not occur: Safety/medical concerns  Chair/bed transfer assist level: 2 Helpers     Locomotion Ambulation   Ambulation assist   Ambulation activity did not occur: Safety/medical concerns(unable due to pain and scrotal and bladder issues)          Walk 10 feet activity   Assist  Walk 10 feet activity did not occur: Safety/medical concerns(unable due to pain and scrotal and bladder issues)  Walk 50 feet activity   Assist Walk 50 feet with 2 turns activity did not occur: Safety/medical concerns(unable due to pain and scrotal and bladder issues)         Walk 150 feet activity   Assist Walk 150 feet activity did not occur: Safety/medical concerns(unable due to pain and scrotal and bladder issues)         Walk 10 feet on uneven surface  activity   Assist Walk 10 feet on uneven surfaces  activity did not occur: Safety/medical concerns(unable due to pain and scrotal and bladder issues)         Wheelchair     Assist   Type of Wheelchair: Manual Wheelchair activity did not occur: Safety/medical concerns(unable due to pain and scrotal and bladder issues)  Wheelchair assist level: Supervision/Verbal cueing Max wheelchair distance: 25'    Wheelchair 50 feet with 2 turns activity    Assist    Wheelchair 50 feet with 2 turns activity did not occur: Safety/medical concerns(unable due to pain and scrotal and bladder issues)       Wheelchair 150 feet activity     Assist Wheelchair 150 feet activity did not occur: Safety/medical concerns(unable due to pain and scrotal and bladder issues)        Medical Problem List and Plan: 1.Debilitysecondary to end-stage renal disease/multi-medical. Hemodialysis recently initiated.  Continue CIR 2. Antithrombotics: -DVT/anticoagulation:Subcutaneous heparin -antiplatelet therapy: N/A 3. Pain Management:Neurontin 300 mg nightly, Ultram as needed  Stable 8/8. Education provided today to patient re: pain control 4. Mood:Celexa 40 mg daily -antipsychotic agents: N/A  -team providing support 5. Neuropsych: This patientiscapable of making decisions on hisown behalf.  Discussed with neuropsychology-appreciate recs, no SI 6. Skin/Wound Care:Scrotal sling Local care to bullae, foam dressings--assume these are related to chronic edema  Appreciate Vasc recs - nonvascular RLE wounds  Appreciate further WOC recs  Surgery performed bx: "necrotic tissue with acute inflammation"===consvt mgt likely---will defer to surgery recs 7. Fluids/Electrolytes/Nutrition:Routine in and outs 8.  ESRD:. Status post AV fistula 01/09/2019. Hemodialysis directed. -continue to diurese with HD, recs per nephro  Filed Weights   02/23/19 1645 02/24/19 0506 02/25/19 0556  Weight: 135.7 kg  (!) 136.6 kg (!) 137 kg   Relatively stable on 8/8 9. Diabetes mellitus. Recent hemoglobin A1c 5.0.   controlled  Continue to monitor 10. Anemia of chronic disease. Continue Aranesp  Hemoglobin 7.5 on 8/6, recs per nephro, labs with HD 11. History of left BKA 5 years ago in [place].   12. Hypertension. Norvasc 10 mg daily.    Slightly elevated on 8/6 13. Morbid obesity. BMI 43.78. Dietary follow-up 14. Hyperlipidemia. Lipitor 15. History of syphilis. Treated outpatient with penicillin to the healthcare department recently. Outpatient follow-up 16. Urine retention:   Urology finally was able to pass 12 Fr coude cath   Foley to be left in place per urology, discussed with urology, due to ongoing edema and patient insistence, Foley to stay in place with follow-up on 8/10 by urology    17.  Sleep disturbance  Melatonin 4.5 mg nightly    Cont CPAP 18.  Leukocytosis  WBCs 13.7 on 8/6, labs with HD  Afebrile  Continue to monitor 19.  Constipation  Bowel meds increased on 7/28, decreased on 7/31, decreased on 8/5 20.  Pruritus  Hygiene for back  Benadryl cream ordered  -encouraged pt to be out of bed  LOS: 17 days A FACE TO FACE EVALUATION WAS PERFORMED  Meredith Staggers 02/25/2019,  8:20 AM

## 2019-02-25 NOTE — Progress Notes (Signed)
[name], male   DOB: Oct 05, 1970, 48 y.o.   MRN: [id number]   [facility] Progress Note   Assessment/ Plan:   1.  Physical debilitation/deconditioning following hospitalization resulting in initiation of hemodialysis: Ongoing physical therapy/Occupational Therapy with inpatient rehabilitation, continues to require coaxing. Not meeting goals- per pt he may need to go to SNF 2. ESRD: New start to hemodialysis, continue dialysis on a TTS schedule via right IJ TDC as his left first stage BBF matures (created 01/09/2019 by Dr. Donnetta Hutching).  Ran MTTS this past week- will do next week as well .  Will eventually be going to [facility].  Take off a lot with each treatment but dont really get anywhere- he claims compliance with fluid restriction but something isnt adding up- just third spacing I guess   3. Anemia: Hemoglobin/hematocrit appear stable status post PRBC without overt loss, will continue to adjust ESA dosing- on darbe 150 q week and give PRBCs when hemoglobin is <7.0. 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  Do not use phosphate/magnesium based laxatives or enemas. 5. Nutrition: Continue renal diet with renal multivitamin.  6.  Chronic pain: Management per PM&R service. 7. BOO-  Foley in place-  Followed by urology- still making some urine - will try some lasix  8. Hyponatremia- due to excess volume-  Aggressive UF with HD   Will not see pt tomorrow, will see him on Monday-  Hd will be arranged for Monday.  Call if any renal issues tomorrow   Subjective:   Had 200 of uop with lasix, still with many complaints/issues-- due for HD later today    Objective:   BP (!) 135/93 (BP Location: Right Arm)   Pulse 87   Temp 98.2 F (36.8 C) (Oral)   Resp 18   Ht 5\' 10"  (1.778 m)   Wt (!) 137 kg   SpO2 97%   BMI 43.34 kg/m   Physical Exam: Gen: Resting in  bed, no new c/o's  CVS: Pulse regular rhythm, normal rate, S1 and S2 normal Resp: Anteriorly clear to auscultation, no rales/rhonchi Abd: Soft, obese, nontender Ext: Improving scrotal/thigh edema but still there .  Status post left BKA.  Left BVT with audible bruit  Labs: BMET Recent Labs  Lab 02/18/19 1332 02/20/19 1430 02/21/19 1256 02/23/19 1338  NA 126* 126* 129* 125*  K 4.1 4.0 4.0 4.6  CL 91* 91* 94* 90*  CO2 25 23 25 24   GLUCOSE 124* 140* 144* 133*  BUN 43* 42* 32* 36*  CREATININE 6.68* 6.94* 5.45* 6.18*  CALCIUM 7.9* 7.7* 7.7* 7.9*  PHOS 3.2 4.0 3.1 3.6   CBC Recent Labs  Lab 02/18/19 1332 02/20/19 1430 02/21/19 1256 02/23/19 1338  WBC 12.7* 12.7* 12.4* 13.7*  HGB 7.5* 7.3* 7.3* 7.5*  HCT 24.4* 24.6* 24.0* 24.7*  MCV 94.9 97.2 94.9 95.4  PLT 387 435* 399 402*   Medications:    . sodium chloride   Intravenous Once  . atorvastatin  40 mg Oral Daily  . calcitRIOL  0.25 mcg Oral Daily  . calcium carbonate  1 tablet Oral BID WC  . Chlorhexidine Gluconate Cloth  6 each Topical Q0600  . citalopram  40 mg Oral Daily  . collagenase   Topical Daily  . darbepoetin (ARANESP) injection - DIALYSIS  150 mcg Intravenous Q Tue-HD  . furosemide  160 mg  Oral BID  . gabapentin  300 mg Oral QHS  . heparin  5,000 Units Subcutaneous Q8H  . lidocaine-EPINEPHrine  20 mL Infiltration Once  . Melatonin  4.5 mg Oral QHS  . pantoprazole  40 mg Oral Daily  . polyethylene glycol  17 g Oral Daily  Yazmina Pareja A Truth Wolaver  02/25/2019, 10:32 AM

## 2019-02-26 ENCOUNTER — Inpatient Hospital Stay (HOSPITAL_COMMUNITY): Payer: Medicare Other | Admitting: Physical Therapy

## 2019-02-26 ENCOUNTER — Inpatient Hospital Stay (HOSPITAL_COMMUNITY): Payer: Medicare Other

## 2019-02-26 MED ORDER — CHLORHEXIDINE GLUCONATE CLOTH 2 % EX PADS: 6.0000 | MEDICATED_PAD | Freq: Every day | CUTANEOUS | Status: DC

## 2019-02-26 MED ORDER — LIDOCAINE 5 % EX OINT
TOPICAL_OINTMENT | Freq: Three times a day (TID) | CUTANEOUS | Status: DC
  Administered 2019-02-26 – 2019-02-27 (×4): via TOPICAL
  Filled 2019-02-26: qty 35.44

## 2019-02-26 NOTE — Progress Notes (Addendum)
Physical Therapy Session Note  Patient Details  Name: Michael Riggs MRN: [id number] Date of Birth: September 22, 1970  Today's Date: 02/26/2019 PT Individual Time: 1013-1036 PT Individual Time Calculation (min): 23 min   Short Term Goals: Week 3:  PT Short Term Goal 1 (Week 3): =LTGs due to ELOS  Skilled Therapeutic Interventions/Progress Updates:  Pt received in bed & reporting inability to get OOB or perform BLE exercises 2/2 scrotal & penile pain. Pt willing to perform BUE exercises & performs bicep curls, chest press, & overhead press all 3 sets x 10-15 reps with 5# weighted bar with cuing for technique. Pt c/o LUE pain (unrated) with exercises & rest breaks provided. Pt reports "I"m exhausted" after BUE exercises. Reviewed weekend goal sheet (as posted on bathroom door) with pt reporting he's been sitting up in bed frequently & is wearing LLE shrinker at all times; educated pt on need to sit up in w/c as much as possible & encouraged him to ask nurses to assist him with sitting EOB later today if he felt he could. Pt left in bed with alarm set & all needs at hand.   Therapy Documentation Precautions:  Precautions Precautions: Fall, Other (comment) Precaution Comments: L BKA with prosthesis; significant scrotal edema Restrictions Weight Bearing Restrictions: No   General: PT Amount of Missed Time (min): 37 Minutes PT Missed Treatment Reason: Patient unwilling to participate;Pain(pt reports inability to participate 2/2 scrotal & penile pain)  Pain: Pt reports scrotal & penile pain (does not rate) but states he has medication applied to area.    Therapy/Group: Individual Therapy  [name] 02/26/2019, 12:21 PM

## 2019-02-26 NOTE — Progress Notes (Signed)
Michael Riggs PHYSICAL MEDICINE & REHABILITATION PROGRESS NOTE   Subjective/Complaints: Anxious about multiple things including pain in groin/penile area.   ROS: Limited due to cognitive/behavioral     Objective:   No results found. Recent Labs    02/23/19 1338 02/25/19 1157  WBC 13.7* 13.3*  HGB 7.5* 7.6*  HCT 24.7* 24.8*  PLT 402* 380   Recent Labs    02/23/19 1338 02/25/19 1157  NA 125* 126*  K 4.6 4.3  CL 90* 91*  CO2 24 23  GLUCOSE 133* 135*  BUN 36* 40*  CREATININE 6.18* 6.13*  CALCIUM 7.9* 7.9*    Intake/Output Summary (Last 24 hours) at 02/26/2019 0810 Last data filed at 02/25/2019 2056 Gross per 24 hour  Intake 220 ml  Output 5455 ml  Net -5235 ml     Physical Exam: Vital Signs Blood pressure (!) 151/80, pulse 82, temperature 98.5 F (36.9 C), temperature source Oral, resp. rate 19, height 5\' 10"  (1.778 m), weight (!) 138.1 kg, SpO2 96 %. Constitutional: No distress . Vital signs reviewed. HEENT: EOMI, oral membranes moist Neck: supple Cardiovascular: RRR without murmur. No JVD    Respiratory: CTA Bilaterally without wheezes or rales. Normal effort    GI: BS +, non-tender, non-distended  Skin: Right lower extremity with dressing C/D/I Left BKA healed Uro: scrotum still edematous but decreased from 2 weeks ago, some irritation where cath is entering urethral meatus Psych: Normal mood.  Normal behavior. Musc: LE edema Neurological: Alert Motor:  RLE: 4-4+/5 proximal to distal, unchanged (still with pain inhibition) Left lower extremity: Hip flexion, knee extension 4+/5, unchanged, unchanged Psych: anxious, distracted, paranoid   Assessment/Plan: 1. Functional deficits secondary to debility which require 3+ hours per day of interdisciplinary therapy in a comprehensive inpatient rehab setting.  Physiatrist is providing close team supervision and 24 hour management of active medical problems listed below.  Physiatrist and rehab team continue to  assess barriers to discharge/monitor patient progress toward functional and medical goals  Care Tool:  Bathing    Body parts bathed by patient: Right arm, Left arm, Chest, Abdomen, Face   Body parts bathed by helper: Front perineal area, Buttocks, Right upper leg, Left upper leg, Right lower leg Body parts n/a: Left lower leg   Bathing assist Assist Level: Maximal Assistance - Patient 24 - 49%     Upper Body Dressing/Undressing Upper body dressing Upper body dressing/undressing activity did not occur (including orthotics): N/A(patient already had a gown) What is the patient wearing?: Hospital gown only    Upper body assist Assist Level: Total Assistance - Patient < 25%    Lower Body Dressing/Undressing Lower body dressing    Lower body dressing activity did not occur: Refused What is the patient wearing?: Hospital gown only     Lower body assist Assist for lower body dressing: Total Assistance - Patient < 25%     Toileting Toileting Toileting Activity did not occur Landscape architect and hygiene only): Safety/medical concerns  Toileting assist Assist for toileting: Dependent - Patient 0%     Transfers Chair/bed transfer  Transfers assist  Chair/bed transfer activity did not occur: Safety/medical concerns  Chair/bed transfer assist level: 2 Helpers     Locomotion Ambulation   Ambulation assist   Ambulation activity did not occur: Safety/medical concerns(unable due to pain and scrotal and bladder issues)          Walk 10 feet activity   Assist  Walk 10 feet activity did not occur: Safety/medical concerns(unable due  to pain and scrotal and bladder issues)        Walk 50 feet activity   Assist Walk 50 feet with 2 turns activity did not occur: Safety/medical concerns(unable due to pain and scrotal and bladder issues)         Walk 150 feet activity   Assist Walk 150 feet activity did not occur: Safety/medical concerns(unable due to pain and  scrotal and bladder issues)         Walk 10 feet on uneven surface  activity   Assist Walk 10 feet on uneven surfaces activity did not occur: Safety/medical concerns(unable due to pain and scrotal and bladder issues)         Wheelchair     Assist   Type of Wheelchair: Manual Wheelchair activity did not occur: Safety/medical concerns(unable due to pain and scrotal and bladder issues)  Wheelchair assist level: Supervision/Verbal cueing Max wheelchair distance: 25'    Wheelchair 50 feet with 2 turns activity    Assist    Wheelchair 50 feet with 2 turns activity did not occur: Safety/medical concerns(unable due to pain and scrotal and bladder issues)       Wheelchair 150 feet activity     Assist Wheelchair 150 feet activity did not occur: Safety/medical concerns(unable due to pain and scrotal and bladder issues)        Medical Problem List and Plan: 1.Debilitysecondary to end-stage renal disease/multi-medical. Hemodialysis recently initiated.  Continue CIR  -pt needs daily Positive reinforcement 2. Antithrombotics: -DVT/anticoagulation:Subcutaneous heparin -antiplatelet therapy: N/A 3. Pain Management:Neurontin 300 mg nightly, Ultram as needed  Stable 8/8. Education provided today to patient re: pain control 4. Mood:Celexa 40 mg daily -antipsychotic agents: N/A  -team providing support 5. Neuropsych: This patientiscapable of making decisions on hisown behalf.  Discussed with neuropsychology-appreciate recs, no SI 6. Skin/Wound Care:Scrotal sling Local care to bullae, foam dressings--assume these are related to chronic edema  Appreciate Vasc recs - nonvascular RLE wounds  Appreciate further WOC recs  Surgery performed bx: "necrotic tissue with acute inflammation"===consvt mgt likely---will defer to surgery recs 7. Fluids/Electrolytes/Nutrition:Routine in and outs 8.  ESRD:. Status post AV fistula  01/09/2019. Hemodialysis directed. -continue to diurese with HD, recs per nephro  Filed Weights   02/25/19 1135 02/25/19 1550 02/26/19 0652  Weight: (!) 139.9 kg 134.9 kg (!) 138.1 kg   Weight up today despite being dialyzed yesterday 9. Diabetes mellitus. Recent hemoglobin A1c 5.0.   controlled  Continue to monitor 10. Anemia of chronic disease. Continue Aranesp  Hemoglobin 7.5 on 8/6, recs per nephro, labs with HD 11. History of left BKA 5 years ago in [place].   12. Hypertension. Norvasc 10 mg daily.    Slightly elevated on 8/9 13. Morbid obesity. BMI 43.78. Dietary follow-up 14. Hyperlipidemia. Lipitor 15. History of syphilis. Treated outpatient with penicillin to the healthcare department recently. Outpatient follow-up 16. Urine retention:   Urology finally was able to pass 12 Fr coude cath   Foley to be left in place per urology, discussed with urology, due to ongoing edema and patient insistence, Foley to stay in place with follow-up on 8/10 by urology   -add lidocaine gel for urethral meatus  -encouraged pt that scrotum is improved 17.  Sleep disturbance  Melatonin 4.5 mg nightly    Cont CPAP 18.  Leukocytosis  WBCs 13.7 on 8/6, labs with HD  Afebrile  Continue to monitor 19.  Constipation  Bowel meds increased on 7/28, decreased on 7/31, decreased on 8/5  20.  Pruritus  Hygiene for back  Benadryl cream ordered  -encouraged pt to be out of bed  LOS: 18 days A FACE TO [place] 02/26/2019, 8:10 AM

## 2019-02-26 NOTE — Progress Notes (Signed)
Occupational Therapy Session Note  Patient Details  Name: Michael Riggs MRN: [id number] Date of Birth: September 15, 1970  75 min skilled OT missed.   Skilled Therapeutic Interventions/Progress Updates:     Upon entering room, pt adamantly refusing therapy. Stated penis pain was too high to attempt any activity- RN aware. Pt reporting MD was putting in a med medication that would be arriving soon. Asked therapist to return "around 2". OT will follow up if schedule allows.     Update: attempted to see pt again in afternoon, 2:15 pm. Pt still declining any participation in therapy.    Therapy/Group: Individual Therapy  Curtis Sites 02/26/2019, 7:01 AM

## 2019-02-27 ENCOUNTER — Inpatient Hospital Stay: Payer: Medicaid Other | Admitting: Internal Medicine

## 2019-02-27 ENCOUNTER — Inpatient Hospital Stay (HOSPITAL_COMMUNITY): Payer: Medicare Other | Admitting: Physical Therapy

## 2019-02-27 ENCOUNTER — Inpatient Hospital Stay (HOSPITAL_COMMUNITY): Payer: Medicare Other | Admitting: Occupational Therapy

## 2019-02-27 ENCOUNTER — Inpatient Hospital Stay (HOSPITAL_COMMUNITY): Payer: Medicare Other

## 2019-02-27 LAB — RENAL FUNCTION PANEL
Albumin: 1.6 g/dL — ABNORMAL LOW (ref 3.5–5.0)
Anion gap: 12 (ref 5–15)
BUN: 42 mg/dL — ABNORMAL HIGH (ref 6–20)
CO2: 24 mmol/L (ref 22–32)
Calcium: 8 mg/dL — ABNORMAL LOW (ref 8.9–10.3)
Chloride: 90 mmol/L — ABNORMAL LOW (ref 98–111)
Creatinine, Ser: 6.42 mg/dL — ABNORMAL HIGH (ref 0.61–1.24)
GFR calc Af Amer: 11 mL/min — ABNORMAL LOW (ref >60)
GFR calc non Af Amer: 9 mL/min — ABNORMAL LOW (ref >60)
Glucose, Bld: 139 mg/dL — ABNORMAL HIGH (ref 70–99)
Phosphorus: 4.3 mg/dL (ref 2.5–4.6)
Potassium: 4.5 mmol/L (ref 3.5–5.1)
Sodium: 126 mmol/L — ABNORMAL LOW (ref 135–145)

## 2019-02-27 LAB — CBC
HCT: 24.5 % — ABNORMAL LOW (ref 39.0–52.0)
Hemoglobin: 7.6 g/dL — ABNORMAL LOW (ref 13.0–17.0)
MCH: 29 pg (ref 26.0–34.0)
MCHC: 31 g/dL (ref 30.0–36.0)
MCV: 93.5 fL (ref 80.0–100.0)
Platelets: 323 10*3/uL (ref 150–400)
RBC: 2.62 MIL/uL — ABNORMAL LOW (ref 4.22–5.81)
RDW: 13.8 % (ref 11.5–15.5)
WBC: 13 10*3/uL — ABNORMAL HIGH (ref 4.0–10.5)
nRBC: 0 % (ref 0.0–0.2)

## 2019-02-27 MED ORDER — LIDOCAINE 5 % EX OINT
TOPICAL_OINTMENT | Freq: Every day | CUTANEOUS | Status: DC
  Administered 2019-02-27 – 2019-03-04 (×15): via TOPICAL
  Administered 2019-03-05 (×2): 1 via TOPICAL
  Administered 2019-03-05 (×3): via TOPICAL
  Administered 2019-03-06: 1 via TOPICAL
  Administered 2019-03-06 – 2019-03-14 (×18): via TOPICAL
  Filled 2019-02-27 (×2): qty 35.44

## 2019-02-27 MED ORDER — TAMSULOSIN HCL 0.4 MG PO CAPS
0.4000 mg | ORAL_CAPSULE | Freq: Every day | ORAL | Status: DC
  Administered 2019-02-27 – 2019-03-14 (×15): 0.4 mg via ORAL
  Filled 2019-02-27 (×16): qty 1

## 2019-02-27 MED ORDER — HEPARIN SODIUM (PORCINE) 1000 UNIT/ML DIALYSIS
20.0000 [IU]/kg | INTRAMUSCULAR | Status: DC | PRN
  Administered 2019-02-28: 17:00:00 2800 [IU] via INTRAVENOUS_CENTRAL
  Filled 2019-02-27 (×2): qty 3

## 2019-02-27 MED ORDER — HEPARIN SODIUM (PORCINE) 1000 UNIT/ML IJ SOLN
INTRAMUSCULAR | Status: AC
  Filled 2019-02-27: qty 4

## 2019-02-27 MED ORDER — CEPHALEXIN 250 MG PO CAPS
250.0000 mg | ORAL_CAPSULE | Freq: Two times a day (BID) | ORAL | Status: AC
  Administered 2019-02-27 – 2019-03-03 (×9): 250 mg via ORAL
  Filled 2019-02-27 (×10): qty 1

## 2019-02-27 MED ORDER — HEPARIN SODIUM (PORCINE) 1000 UNIT/ML DIALYSIS
20.0000 [IU]/kg | INTRAMUSCULAR | Status: DC | PRN
  Filled 2019-02-27: qty 3

## 2019-02-27 NOTE — Progress Notes (Signed)
Patient is on CPAP at this time and tolerating it well.

## 2019-02-27 NOTE — Progress Notes (Signed)
Physical Therapy Session Note  Patient Details  Name: Michael Riggs MRN: [id number] Date of Birth: 1971-05-26  Today's Date: 02/27/2019 PT Individual Time: 1300-1320 PT Individual Time Calculation (min): 20 min   Short Term Goals:  Week 3:  PT Short Term Goal 1 (Week 3): =LTGs due to ELOS  Skilled Therapeutic Interventions/Progress Updates:   Pt resting in bed.  He was unwilling to get OOB due to scrotal and penile pain.  Pt willing to participate in LLE strengthening : 10 x 2 R quad set,  10 x 1 R small excursion ankle pumps.  Pt verbose and inappropriate regarding edema/pain.  At end of session, [name] entering to change RLE dressing.  She stated that he is awaiting HD; today is not his usual day, but has been added due to edema.     Therapy Documentation Precautions:  Precautions Precautions: Fall, Other (comment) Precaution Comments: L BKA with prosthesis; significant scrotal edema Restrictions Weight Bearing Restrictions: No General: PT Amount of Missed Time (min): 25 Minutes PT Missed Treatment Reason: Other (Comment)(HD)  Pain: 5/10 scrotum and penis, resting; premedicated         Therapy/Group: Individual Therapy  Shailen Thielen 02/27/2019, 2:58 PM

## 2019-02-27 NOTE — Progress Notes (Signed)
[name], male   DOB: [birth year], 48 y.o.   MRN: [id number] S:He is very upset and anxious about removing the foley catheter as its placement was very painful O:BP (!) 153/87 (BP Location: Right Arm)   Pulse 88   Temp 98 F (36.7 C) (Oral)   Resp 16   Ht 5\' 10"  (1.778 m)   Wt (!) 139.5 kg   SpO2 96%   BMI 44.13 kg/m   Intake/Output Summary (Last 24 hours) at 02/27/2019 1202 Last data filed at 02/27/2019 0900 Gross per 24 hour  Intake 720 ml  Output 200 ml  Net 520 ml   Intake/Output: I/O last 3 completed shifts: In: 680 [P.O.:680] Out: 425 [Urine:425]  Intake/Output this shift:  Total I/O In: 240 [P.O.:240] Out: -  Weight change: -0.4 kg Gen: NAD CVS: no rub Resp: cta Abd: benign Ext: LUE AVF +T/B, +scrotal edema  Recent Labs  Lab 02/20/19 1430 02/21/19 1256 02/23/19 1338 02/25/19 1157  NA 126* 129* 125* 126*  K 4.0 4.0 4.6 4.3  CL 91* 94* 90* 91*  CO2 23 25 24 23   GLUCOSE 140* 144* 133* 135*  BUN 42* 32* 36* 40*  CREATININE 6.94* 5.45* 6.18* 6.13*  ALBUMIN 1.5* 1.6* 1.6* 1.6*  CALCIUM 7.7* 7.7* 7.9* 7.9*  PHOS 4.0 3.1 3.6 3.9   Liver Function Tests: Recent Labs  Lab 02/21/19 1256 02/23/19 1338 02/25/19 1157  ALBUMIN 1.6* 1.6* 1.6*   No results for input(s): LIPASE, AMYLASE in the last 168 hours. No results for input(s): AMMONIA in the last 168 hours. CBC: Recent Labs  Lab 02/20/19 1430 02/21/19 1256 02/23/19 1338 02/25/19 1157  WBC 12.7* 12.4* 13.7* 13.3*  HGB 7.3* 7.3* 7.5* 7.6*  HCT 24.6* 24.0* 24.7* 24.8*  MCV 97.2 94.9 95.4 93.2  PLT 435* 399 402* 380   Cardiac Enzymes: No results for input(s): CKTOTAL, CKMB, CKMBINDEX, TROPONINI in the last 168 hours. CBG: Recent Labs  Lab 02/22/19 1233 02/22/19 1712  GLUCAP 98 131*    Iron Studies: No results for input(s): IRON, TIBC, TRANSFERRIN, FERRITIN in the last 72 hours. Studies/Results: No results found. . sodium chloride   Intravenous Once  . atorvastatin  40  mg Oral Daily  . calcitRIOL  0.25 mcg Oral Daily  . calcium carbonate  1 tablet Oral BID WC  . cephALEXin  250 mg Oral Q12H  . Chlorhexidine Gluconate Cloth  6 each Topical Q0600  . citalopram  40 mg Oral Daily  . collagenase   Topical Daily  . darbepoetin (ARANESP) injection - DIALYSIS  150 mcg Intravenous Q Tue-HD  . furosemide  160 mg Oral BID  . gabapentin  300 mg Oral QHS  . heparin  5,000 Units Subcutaneous Q8H  . lidocaine   Topical 5 X Daily  . lidocaine-EPINEPHrine  20 mL Infiltration Once  . Melatonin  4.5 mg Oral QHS  . pantoprazole  40 mg Oral Daily  . polyethylene glycol  17 g Oral Daily  . tamsulosin  0.4 mg Oral QPC breakfast    BMET    Component Value Date/Time   NA 126 (L) 02/25/2019 1157   K 4.3 02/25/2019 1157   CL 91 (L) 02/25/2019 1157   CO2 23 02/25/2019 1157   GLUCOSE 135 (H) 02/25/2019 1157   BUN 40 (H) 02/25/2019 1157   CREATININE 6.13 (H) 02/25/2019 1157   CALCIUM 7.9 (L) 02/25/2019 1157   GFRNONAA 10 (L) 02/25/2019 1157   GFRAA 12 (L) 02/25/2019 1157  CBC    Component Value Date/Time   WBC 13.3 (H) 02/25/2019 1157   RBC 2.66 (L) 02/25/2019 1157   HGB 7.6 (L) 02/25/2019 1157   HCT 24.8 (L) 02/25/2019 1157   PLT 380 02/25/2019 1157   MCV 93.2 02/25/2019 1157   MCH 28.6 02/25/2019 1157   MCHC 30.6 02/25/2019 1157   RDW 13.7 02/25/2019 1157   LYMPHSABS 1.4 02/09/2019 1500   MONOABS 1.6 (H) 02/09/2019 1500   EOSABS 0.8 (H) 02/09/2019 1500   BASOSABS 0.1 02/09/2019 1500     Assessment/Plan:  1. ESRD- Initiated HD on 01/31/19 and has been tolerating HD well and will eventually go to [facility] after discharge on a TTS schedule.  Plan for HD today per his schedule but will require daily HD for volume management for a few treatments. 2. Volume overload- has significant idwg and scrotal edema.  Will plan for daily HD to help with volume management but I did stress the need for fluid restriction 3. Bladder outlet obstruction and  edema of foreskin and scrotum.  Urology has been consulted and plan for removal of foley catheter today.  If he fails trial off of cath will likely require operative procedure.  Will plan for daily HD to help improve edema 4. Anemia of ESRD- h/h stable.  On ESA 5. SHPTH- on calcitriol 6. Latent syphilis- per primary svc 7. 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  Wound care consulted. 8. Deconditioning- cont with PT/OT  Donetta Potts, MD [facility] [phone]

## 2019-02-27 NOTE — Progress Notes (Signed)
[facility] PHYSICAL MEDICINE & REHABILITATION PROGRESS NOTE   Subjective/Complaints: Had questions about foley. Ok with it coming out today. Lidocaine gel really helpful.   ROS: Patient denies fever, rash, sore throat, blurred vision, nausea, vomiting, diarrhea, cough, shortness of breath or chest pain, joint or back pain, headache, or mood change.      Objective:   No results found. Recent Labs    02/25/19 1157  WBC 13.3*  HGB 7.6*  HCT 24.8*  PLT 380   Recent Labs    02/25/19 1157  NA 126*  K 4.3  CL 91*  CO2 23  GLUCOSE 135*  BUN 40*  CREATININE 6.13*  CALCIUM 7.9*    Intake/Output Summary (Last 24 hours) at 02/27/2019 0850 Last data filed at 02/26/2019 1958 Gross per 24 hour  Intake 480 ml  Output 200 ml  Net 280 ml     Physical Exam: Vital Signs Blood pressure (!) 153/87, pulse 88, temperature 98 F (36.7 C), temperature source Oral, resp. rate 16, height 5\' 10"  (1.778 m), weight (!) 139.5 kg, SpO2 96 %. Constitutional: No distress . Vital signs reviewed. HEENT: EOMI, oral membranes moist Neck: supple Cardiovascular: RRR without murmur. No JVD    Respiratory: CTA Bilaterally without wheezes or rales. Normal effort    GI: BS +, non-tender, non-distended  Skin: Right lower extremity with dressing C/D/I Left BKA healed Uro: scrotum a little more edematous today. Urethral meatus still raw.  Psych: Normal mood.  Normal behavior. Musc: LE edema ongoing Neurological: Alert Motor:  RLE: 4-4+/5 proximal to distal, unchanged Left lower extremity: Hip flexion, knee extension 4+/5, unchanged  Psych: anxious   Assessment/Plan: 1. Functional deficits secondary to debility which require 3+ hours per day of interdisciplinary therapy in a comprehensive inpatient rehab setting.  Physiatrist is providing close team supervision and 24 hour management of active medical problems listed below.  Physiatrist and rehab team continue to assess barriers to  discharge/monitor patient progress toward functional and medical goals  Care Tool:  Bathing    Body parts bathed by patient: Right arm, Left arm, Chest, Abdomen, Face   Body parts bathed by helper: Front perineal area, Buttocks, Right upper leg, Left upper leg, Right lower leg Body parts n/a: Left lower leg   Bathing assist Assist Level: Maximal Assistance - Patient 24 - 49%     Upper Body Dressing/Undressing Upper body dressing Upper body dressing/undressing activity did not occur (including orthotics): N/A(patient already had a gown) What is the patient wearing?: Hospital gown only    Upper body assist Assist Level: Total Assistance - Patient < 25%    Lower Body Dressing/Undressing Lower body dressing    Lower body dressing activity did not occur: Refused What is the patient wearing?: Hospital gown only     Lower body assist Assist for lower body dressing: Total Assistance - Patient < 25%     Toileting Toileting Toileting Activity did not occur Landscape architect and hygiene only): Safety/medical concerns  Toileting assist Assist for toileting: Dependent - Patient 0%     Transfers Chair/bed transfer  Transfers assist  Chair/bed transfer activity did not occur: Safety/medical concerns  Chair/bed transfer assist level: 2 Helpers     Locomotion Ambulation   Ambulation assist   Ambulation activity did not occur: Safety/medical concerns(unable due to pain and scrotal and bladder issues)          Walk 10 feet activity   Assist  Walk 10 feet activity did not occur: Safety/medical concerns(unable  due to pain and scrotal and bladder issues)        Walk 50 feet activity   Assist Walk 50 feet with 2 turns activity did not occur: Safety/medical concerns(unable due to pain and scrotal and bladder issues)         Walk 150 feet activity   Assist Walk 150 feet activity did not occur: Safety/medical concerns(unable due to pain and scrotal and bladder  issues)         Walk 10 feet on uneven surface  activity   Assist Walk 10 feet on uneven surfaces activity did not occur: Safety/medical concerns(unable due to pain and scrotal and bladder issues)         Wheelchair     Assist   Type of Wheelchair: Manual Wheelchair activity did not occur: Safety/medical concerns(unable due to pain and scrotal and bladder issues)  Wheelchair assist level: Supervision/Verbal cueing Max wheelchair distance: 25'    Wheelchair 50 feet with 2 turns activity    Assist    Wheelchair 50 feet with 2 turns activity did not occur: Safety/medical concerns(unable due to pain and scrotal and bladder issues)       Wheelchair 150 feet activity     Assist Wheelchair 150 feet activity did not occur: Safety/medical concerns(unable due to pain and scrotal and bladder issues)        Medical Problem List and Plan: 1.Debilitysecondary to end-stage renal disease/multi-medical. Hemodialysis recently initiated.  Continue CIR  -pt requires daily positive reinforcement  -dispo plan? 2. Antithrombotics: -DVT/anticoagulation:Subcutaneous heparin -antiplatelet therapy: N/A 3. Pain Management:Neurontin 300 mg nightly, Ultram as needed  Stable 8/8. Education provided today to patient re: pain control 4. Mood:Celexa 40 mg daily -antipsychotic agents: N/A  -team providing support 5. Neuropsych: This patientiscapable of making decisions on hisown behalf.  Discussed with neuropsychology-appreciate recs, no SI 6. Skin/Wound Care:Scrotal sling Local care to bullae, foam dressings--assume these are related to chronic edema  Appreciate Vasc recs - nonvascular RLE wounds  Appreciate further WOC recs  Surgery performed bx: "necrotic tissue with acute inflammation" ---will defer to surgery recs 7. Fluids/Electrolytes/Nutrition:Routine in and outs 8.  ESRD:. Status post AV fistula 01/09/2019. Hemodialysis  directed. -continue to diurese per nephro Filed Weights   02/25/19 1550 02/26/19 0652 02/27/19 0500  Weight: 134.9 kg (!) 138.1 kg (!) 139.5 kg   Weight up again today, 9. Diabetes mellitus. Recent hemoglobin A1c 5.0.   controlled  Continue to monitor 10. Anemia of chronic disease. Continue Aranesp  Hemoglobin 7.5 on 8/6, recs per nephro, labs with HD 11. History of left BKA 5 years ago in [place].   12. Hypertension. Norvasc 10 mg daily.    Slightly elevated on 8/10 13. Morbid obesity. BMI 43.78. Dietary follow-up 14. Hyperlipidemia. Lipitor 15. History of syphilis. Treated outpatient with penicillin to the healthcare department recently. Outpatient follow-up 16. Urine retention:    12 Fr coude cath presently  Urology saw pt, I connected with resident.   -dc foley today, see what he's able to put out  -replace foley in am tomorrow under sedation if no output  -begin keflex for bladder proph  -flomax to help with emptying   -continue lidocaine gel for urethral meatus---this helped  -reviewed with patient. He's ok with plan 17.  Sleep disturbance  Melatonin 4.5 mg nightly    Cont CPAP 18.  Leukocytosis  WBCs 13.7 on 8/6, labs with HD  Afebrile  Continue to monitor 19.  Constipation  Bowel meds increased on 7/28,  decreased on 7/31, decreased on 8/5 20.  Pruritus  Local care, air to back  Benadryl    LOS: 19 days A FACE TO [place] 02/27/2019, 8:50 AM

## 2019-02-27 NOTE — Progress Notes (Signed)
Foley catheter removed as per order. Pressure injury/skin tear noted at head of penis, between 9 and 12 o'clock. Painful. Scant serous drainage. Per PA, urology aware and following. Patient understands he is NPO from midnight. No voids after foley removed; patient in dialysis and 6 L removed. BSC upon return to unit 50 mL.

## 2019-02-27 NOTE — Progress Notes (Addendum)
Urology Progress Note   **New recommendations at end of note**  48 year old male with end-stage renal disease, morbid obesity, GERD, OSA, who was admitted in July 2020 for initiation of dialysis.  Now in rehab.  Was initially thought to be newly anuric but was still making urine despite being on dialysis.  Urology was consulted on 02/09/2019 due to abdominal pain and bladder scan showed 600 cc.  48 French catheter was eventually placed over wire but catheterization was extremely difficult due to edematous foreskin, edematous scrotum, pannus, severe phimosis, meatal stenosis.  Interval 8/10: Catheter is been in place for approximately 2 weeks.  Still has penoscrotal swelling but it has improved slightly.  He is much closer to his baseline status both mentally and physically.   I had a discussion with him this morning regarding possible Foley catheter removal.  I specifically explained that he is not able to pee on his own catheter would likely need to be replaced.  He will likely not be amenable to doing this at the bedside should he fail TOV   Making between 300-500 mL's of urine per day   Objective: Vital signs in last 24 hours: Temp:  [98 F (36.7 C)-98.6 F (37 C)] 98 F (36.7 C) (08/10 0540) Pulse Rate:  [79-91] 88 (08/10 0540) Resp:  [16-19] 16 (08/10 0540) BP: (143-153)/(81-87) 153/87 (08/10 0540) SpO2:  [96 %-100 %] 96 % (08/10 0540) Weight:  [139.5 kg] 139.5 kg (08/10 0500)  Intake/Output from previous day: 08/09 0701 - 08/10 0700 In: 480 [P.O.:480] Out: 200 [Urine:200] Intake/Output this shift: No intake/output data recorded.  Physical Exam:  GU: Scrotal edema moderate. Less foreskin edema but exam limited by patient not being agreeable to a full exam.    Lab Results: Recent Labs    02/25/19 1157  HGB 7.6*  HCT 24.8*   BMET Recent Labs    02/25/19 1157  NA 126*  K 4.3  CL 91*  CO2 23  GLUCOSE 135*  BUN 40*  CREATININE 6.13*  CALCIUM 7.9*      Studies/Results: No results found.  Assessment/Plan:  48 y.o. male with low volume urine output in the setting of end-stage renal disease.  Extremely difficult catheterization, now 57 French Foley placed over a wire.  Difficult situation with low urine output and anatomy that would make re-catheterization difficult. Additionally the patient is very aggressive toward me and would be unwilling to attempt any type of bedside urologic procedure again.   Plan:  -Foley on the morning of 8/10.  Will likely take 24 hours for the patient to accumulate enough urine to void.  -Please obtain post void residual bladder scan and measure voided output if able  -Make patient n.p.o. at 1159PM on 8/10.  In case he needs a procedure to replace his catheter on 8/11.  Should his trial void continue 3/11 please make n.p.o. at midnight at 11:59 PM on 8/11 as well  -Start tamsulosin 0.4 mg daily to assist with trial of void  -Start Keflex for 5 days today  given longstanding duration of catheter and possible bacterial colonization  Will continue to follow along  Dispo: Floor   LOS: 19 days   Tharon Aquas 02/27/2019, 7:15 AM

## 2019-02-27 NOTE — Progress Notes (Signed)
Physical Therapy Session Note  Patient Details  Name: Michael Riggs MRN: [id number] Date of Birth: [birth year]  Today's Date: 02/27/2019 PT Individual Time: [id number] PT Individual Time Calculation (min): 10 min  and Today's Date: 02/27/2019 PT Missed Time: (35) Missed Time Reason: Patient unwilling to participate  Short Term Goals: Week 3:  PT Short Term Goal 1 (Week 3): =LTGs due to ELOS  Skilled Therapeutic Interventions/Progress Updates:   Pt in supine and reports increased scrotal edema and pain today. Reports pain at penis and scrotum. Therapist visually inspected pt's penis and scrotum, scrotum noticeably larger in size since last seen by this therapist a few days ago. Penis also noted to have skin break down on skin of R lateral side. RN and MD already aware. Pt states he cannot perform any OOB activity or supine exercises today 2/2 pain, unable to tolerate HOB past 30-45 deg 2/2 scrotal edema/pain. He reports compliance w/ weekend goals including progessively bring HOB up to tolerate upright positioning more, wearing L residual limb shrinker, and BLE therex. Missed 35 min of skilled PT 2/2 pain/refusal.   Therapy Documentation Precautions:  Precautions Precautions: Fall, Other (comment) Precaution Comments: L BKA with prosthesis; significant scrotal edema Restrictions Weight Bearing Restrictions: No General: PT Amount of Missed Time (min): (35) PT Missed Treatment Reason: Patient unwilling to participate Pain: Pain Assessment Pain Scale: 0-10 Pain Score: 10-Worst pain ever Pain Type: Acute pain Pain Location: Scrotum Pain Descriptors / Indicators: Aching;Burning Pain Onset: On-going Pain Intervention(s): Repositioned  Therapy/Group: Individual Therapy  Shakeena Kafer [name] 02/27/2019, 11:37 AM

## 2019-02-27 NOTE — Progress Notes (Signed)
Occupational Therapy Session Note  Patient Details  Name: Michael Riggs MRN: [id number] Date of Birth: August 31, 1970  Today's Date: 02/27/2019 OT Individual Time: 1001-1031 OT Individual Time Calculation (min): 30 min  and Today's Date: 02/27/2019 OT Missed Time: 15 Minutes Missed Time Reason: Pain   Short Term Goals: Week 3:  OT Short Term Goal 1 (Week 3): Pt will tolerate sitting in wc or recliner for 1 hour in between therapy sessions OT Short Term Goal 2 (Week 3): Pt will complete transfer to commode with LRAD and Mod A of 1 caregiver OT Short Term Goal 3 (Week 3): Pt will tolerate leaning forward at the sink to shave OT Short Term Goal 4 (Week 3): Pt will tolerate wearing scrotal sling during transfer.  Skilled Therapeutic Interventions/Progress Updates:    Pt greeted semi-reclined in bed. Pt reports 10/10+ pain in penis/scrotal area after Foley catheter removed this morning. Pt declined any OOB activity 2/2 pain. Pt reports feeling like is scortum in bigger today. Pt declined OT to provide compression with stockinette. OT placed towel roll under scrotum to elevate slightly. Discussed trying to sit up higher in the bed today, pt at 35 degrees during therapy session. Pt completed UB there-ex, 4 sets of 20 overhead reaching. Pt left semi-reclined in bed with bed alarm on and needs met.   Therapy Documentation Precautions:  Precautions Precautions: Fall, Other (comment) Precaution Comments: L BKA with prosthesis; significant scrotal edema Restrictions Weight Bearing Restrictions: No General: General OT Amount of Missed Time: 15 Minutes Pain: Pain Assessment Pain Scale: 0-10 Pain Score: 10-Worst pain ever Pain Type: Acute pain Pain Location: Scrotum Pain Descriptors / Indicators: Aching;Burning Pain Onset: On-going Pain Intervention(s): Repositioned   Therapy/Group: Individual Therapy  Valma Cava 02/27/2019, 10:35 AM

## 2019-02-28 ENCOUNTER — Inpatient Hospital Stay (HOSPITAL_COMMUNITY): Payer: Medicare Other | Admitting: Occupational Therapy

## 2019-02-28 ENCOUNTER — Inpatient Hospital Stay (HOSPITAL_COMMUNITY): Payer: Medicare Other | Admitting: Physical Therapy

## 2019-02-28 LAB — CBC
HCT: 25.5 % — ABNORMAL LOW (ref 39.0–52.0)
Hemoglobin: 8 g/dL — ABNORMAL LOW (ref 13.0–17.0)
MCH: 28.8 pg (ref 26.0–34.0)
MCHC: 31.4 g/dL (ref 30.0–36.0)
MCV: 91.7 fL (ref 80.0–100.0)
Platelets: 319 K/uL (ref 150–400)
RBC: 2.78 MIL/uL — ABNORMAL LOW (ref 4.22–5.81)
RDW: 13.7 % (ref 11.5–15.5)
WBC: 11.7 K/uL — ABNORMAL HIGH (ref 4.0–10.5)
nRBC: 0 % (ref 0.0–0.2)

## 2019-02-28 MED ORDER — LIDOCAINE HCL 1 % IJ SOLN
20.0000 mL | Freq: Once | INTRAMUSCULAR | Status: DC
  Filled 2019-02-28: qty 20

## 2019-02-28 MED ORDER — HEPARIN SODIUM (PORCINE) 1000 UNIT/ML IJ SOLN
INTRAMUSCULAR | Status: AC
  Administered 2019-02-28: 2800 [IU] via INTRAVENOUS_CENTRAL
  Filled 2019-02-28: qty 4

## 2019-02-28 MED ORDER — SODIUM CHLORIDE 0.9 % IR SOLN: 3000.0000 mL | Status: DC

## 2019-02-28 MED ORDER — LIDOCAINE 1% INJECTION FOR CIRCUMCISION
20.0000 mL | INJECTION | Freq: Once | INTRAVENOUS | Status: DC
  Filled 2019-02-28: qty 20

## 2019-02-28 MED ORDER — DARBEPOETIN ALFA 150 MCG/0.3ML IJ SOSY
PREFILLED_SYRINGE | INTRAMUSCULAR | Status: AC
  Filled 2019-02-28: qty 0.3

## 2019-02-28 MED ORDER — CALCITRIOL 0.25 MCG PO CAPS
ORAL_CAPSULE | ORAL | Status: AC
  Filled 2019-02-28: qty 1

## 2019-02-28 MED ORDER — TRAMADOL HCL 50 MG PO TABS
ORAL_TABLET | ORAL | Status: AC
  Filled 2019-02-28: qty 1

## 2019-02-28 NOTE — Progress Notes (Signed)
[place] PHYSICAL MEDICINE & REHABILITATION PROGRESS NOTE   Subjective/Complaints: Pt reports no voiding since foley out. Had 390 on scan this morning. He's NPO and hungry. Still some penile pain.   ROS: Patient denies fever, rash, sore throat, blurred vision, nausea, vomiting, diarrhea, cough, shortness of breath or chest pain, joint or back pain, headache, or mood change.      Objective:   No results found. Recent Labs    02/27/19 1529 02/28/19 0556  WBC 13.0* 11.7*  HGB 7.6* 8.0*  HCT 24.5* 25.5*  PLT 323 319   Recent Labs    02/25/19 1157 02/27/19 1528  NA 126* 126*  K 4.3 4.5  CL 91* 90*  CO2 23 24  GLUCOSE 135* 139*  BUN 40* 42*  CREATININE 6.13* 6.42*  CALCIUM 7.9* 8.0*    Intake/Output Summary (Last 24 hours) at 02/28/2019 0908 Last data filed at 02/28/2019 [id number] Gross per 24 hour  Intake 400 ml  Output 6105 ml  Net -5705 ml     Physical Exam: Vital Signs Blood pressure 138/78, pulse 84, temperature 98.4 F (36.9 C), temperature source Oral, resp. rate 18, height 5\' 10"  (1.778 m), weight (!) 143.9 kg, SpO2 97 %. Constitutional: No distress . Vital signs reviewed. HEENT: EOMI, oral membranes moist Neck: supple Cardiovascular: RRR without murmur. No JVD    Respiratory: CTA Bilaterally without wheezes or rales. Normal effort    GI: BS +, mild hypogastric pain, non-distended  Skin: Right lower extremity with dressing C/D/I, sl serous drainage Left BKA healed Uro: scrotum remains swollen, no change. Urethral meatus still raw.  Psych: Normal mood.  Normal behavior. Musc: LE edema ongoing Neurological: Alert Motor:  RLE: 4-4+/5 proximal to distal, stabler Left lower extremity: Hip flexion, knee extension 4+/5, unchanged  Psych: remains anxious   Assessment/Plan: 1. Functional deficits secondary to debility which require 3+ hours per day of interdisciplinary therapy in a comprehensive inpatient rehab setting.  Physiatrist is providing close team  supervision and 24 hour management of active medical problems listed below.  Physiatrist and rehab team continue to assess barriers to discharge/monitor patient progress toward functional and medical goals  Care Tool:  Bathing    Body parts bathed by patient: Right arm, Left arm, Chest, Abdomen, Face   Body parts bathed by helper: Front perineal area, Buttocks, Right upper leg, Left upper leg, Right lower leg Body parts n/a: Left lower leg   Bathing assist Assist Level: Maximal Assistance - Patient 24 - 49%     Upper Body Dressing/Undressing Upper body dressing Upper body dressing/undressing activity did not occur (including orthotics): N/A(patient already had a gown) What is the patient wearing?: Hospital gown only    Upper body assist Assist Level: Total Assistance - Patient < 25%    Lower Body Dressing/Undressing Lower body dressing    Lower body dressing activity did not occur: Refused What is the patient wearing?: Hospital gown only     Lower body assist Assist for lower body dressing: Total Assistance - Patient < 25%     Toileting Toileting Toileting Activity did not occur Landscape architect and hygiene only): Safety/medical concerns  Toileting assist Assist for toileting: Dependent - Patient 0%     Transfers Chair/bed transfer  Transfers assist  Chair/bed transfer activity did not occur: Safety/medical concerns  Chair/bed transfer assist level: 2 Helpers     Locomotion Ambulation   Ambulation assist   Ambulation activity did not occur: Safety/medical concerns(unable due to pain and scrotal and bladder  issues)          Walk 10 feet activity   Assist  Walk 10 feet activity did not occur: Safety/medical concerns(unable due to pain and scrotal and bladder issues)        Walk 50 feet activity   Assist Walk 50 feet with 2 turns activity did not occur: Safety/medical concerns(unable due to pain and scrotal and bladder issues)         Walk  150 feet activity   Assist Walk 150 feet activity did not occur: Safety/medical concerns(unable due to pain and scrotal and bladder issues)         Walk 10 feet on uneven surface  activity   Assist Walk 10 feet on uneven surfaces activity did not occur: Safety/medical concerns(unable due to pain and scrotal and bladder issues)         Wheelchair     Assist   Type of Wheelchair: Manual Wheelchair activity did not occur: Safety/medical concerns(unable due to pain and scrotal and bladder issues)  Wheelchair assist level: Supervision/Verbal cueing Max wheelchair distance: 25'    Wheelchair 50 feet with 2 turns activity    Assist    Wheelchair 50 feet with 2 turns activity did not occur: Safety/medical concerns(unable due to pain and scrotal and bladder issues)       Wheelchair 150 feet activity     Assist Wheelchair 150 feet activity did not occur: Safety/medical concerns(unable due to pain and scrotal and bladder issues)        Medical Problem List and Plan: 1.Debilitysecondary to end-stage renal disease/multi-medical. Hemodialysis recently initiated.  Continue CIR  -pt requires daily positive reinforcement  -dispo plan?, team conference today 2. Antithrombotics: -DVT/anticoagulation:Subcutaneous heparin -antiplatelet therapy: N/A 3. Pain Management:Neurontin 300 mg nightly, Ultram as needed  Stable 8/8. Education provided today to patient re: pain control 4. Mood:Celexa 40 mg daily -antipsychotic agents: N/A  -team providing support 5. Neuropsych: This patientiscapable of making decisions on hisown behalf.  Discussed with neuropsychology-appreciate recs, no SI 6. Skin/Wound Care:Scrotal sling Local care to bullae, foam dressings--assume these are related to chronic edema  Appreciate Vasc recs - nonvascular RLE wounds  Appreciate further WOC recs  Surgery performed bx: "necrotic tissue with acute  inflammation" ---will defer to surgery recs 7. Fluids/Electrolytes/Nutrition:Routine in and outs 8.  ESRD:. Status post AV fistula 01/09/2019. Hemodialysis directed. -continue to diurese per nephro Filed Weights   02/27/19 1415 02/27/19 1820 02/28/19 0657  Weight: (!) 140.3 kg 134.3 kg (!) 143.9 kg   Weight up again today, 9. Diabetes mellitus. Recent hemoglobin A1c 5.0.   controlled  Continue to monitor 10. Anemia of chronic disease. Continue Aranesp  Hemoglobin 7.5 on 8/6, recs per nephro, labs with HD 11. History of left BKA 5 years ago in [address].   12. Hypertension. Norvasc 10 mg daily.    Slightly elevated on 8/10 13. Morbid obesity. BMI 43.78. Dietary follow-up 14. Hyperlipidemia. Lipitor 15. History of syphilis. Treated outpatient with penicillin to the healthcare department recently. Outpatient follow-up 16. Urine retention:    foley out. No void, scan for 400cc  -urology wants him to reach 600cc before considering replacement of cath  -keep NPO  -continues on keflex for bladder proph  -flomax to help with emptying   -continue lidocaine gel for urethral meatus---this has helped   17.  Sleep disturbance  Melatonin 4.5 mg nightly    Cont CPAP 18.  Leukocytosis  WBCs 11.7  Afebrile  Continue to monitor 19.  Constipation  Bowel meds increased on 7/28, decreased on 7/31, decreased on 8/5 20.  Pruritus--generally improved  Local care, air to back  Benadryl    LOS: 20 days A FACE TO [place] 02/28/2019, 9:08 AM

## 2019-02-28 NOTE — Progress Notes (Addendum)
Urology Progress Note:   Evaluated patient this evening after he got back from dialysis. He was upset about being NPO and I explained why we did this (in case he needed a procedure in the OR).  Bladder scan at 5:30PM was 464mls. He has no bladder discomfort and admitted to me that before coming into the hospital he usually only voided once every 1-2 days. He previously told me that he voided multiple times a day (says he lied before because he was mad at me).  I spoke to him in detail about our plan outlined below.  He was somewhat agreeable to performing catheter placement at bedside if we used topical and injectable lidocaine   Plan: - Urology cart can go back to the [place] for diet now - No need for patient to be NPO - Bladder scan next at 6am on 8/12 - If bladder scans become unsafe levels (>800cc) or patient is in pain from bladder fullness, next stop would likely be a procedure at bedside.   We will evaluate him next tomorrow morning.

## 2019-02-28 NOTE — Progress Notes (Addendum)
Renal Navigator received call from CIR CSW/L. Hoyle yesterday afternoon to discuss concerns that patient's swelling and pain in groin area prevent him from being able to sit. She reports that they are considering SNF placement after CIR discharge to further allow for swelling to decrease. We discussed patient's need to sit in recliner for OP HD treatment. (This should be tried inpatient to ensure he is able).  She reports he cannot sit up in a wheelchair for transport. Renal Navigator to investigate the possibility for ambulance transportation so that patient can transport to OP HD on a stretcher instead of a wheelchair while he is unable to sit.  Renal Navigator called OP HD clinic/[facility] MSW A. Clifton James to inform of current situation. She is currently not aware of any patients being transported by ambulance, but will look in to this possibility and get back to Renal Navigator. Renal Navigator is aware that [facility] [employer]) has provided ambulance transport to OP HD treatment for patients in [place] in the past and called [facility] to speak with Chief/P. Andres Labrum. She reports that they have a truck in [place] and that ambulance transport for patient is a possibility. She reports that they would need him to be on a MWF OP HD schedule and to work with hospital staff to provide medical records in order for their Financial Personnel to get prior authorization from Medicare.  Renal Navigator left message to update clinic MSW and spoke with CIR CSW to discuss. Renal Navigator available to assist as needed to provide support and services to meet patient's needs.  Alphonzo Cruise, [place] Renal Navigator [phone]

## 2019-02-28 NOTE — Progress Notes (Signed)
Physical Therapy Session Note  Patient Details  Name: Michael Riggs MRN: [id number] Date of Birth: 05/22/1971  Pt declining any activity today. States he is NPO, waiting to go to surgery to insert new catheter. Per MD and RN, waiting until pt is >600 mL of urine in bladder. Pt continues to decline activity 2/2 being NPO, fatigue, and scrotal pain. Missed 60 min of skilled PT.   Lawonda Pretlow [name] 02/28/2019, 11:49 AM

## 2019-02-28 NOTE — Progress Notes (Addendum)
Urology Progress Note:  Patient foley was removed at 10:00 on 8/10. Has not yet voided. Bladder scan 394cc this AM.  Plan: - Bladder scan Q3hrs - Once scan >600cc, we can decide if patient needs bedside vs OR foley (likely OR as patient has said he would refuse bedside foley - Keep NPO until we can decide about OR  Contact 7AM to 5PM - [place], Urology Resident

## 2019-02-28 NOTE — Progress Notes (Addendum)
Verbal order per Linna Hoff ok to administer PRN pain medication. Pt asleep when returned to give medication

## 2019-02-28 NOTE — Progress Notes (Addendum)
Occupational Therapy Session Note  Patient Details  Name: Michael Riggs MRN: [id number] Date of Birth: 02/28/1971  Today's Date: 02/28/2019 OT Individual Time: 9290-9030 OT Individual Time Calculation (min): 10 min  and Today's Date: 02/28/2019 OT Missed Time: 50 Minutes Missed Time Reason: Patient fatigue   Session 2  OT Missed Time: 30 Minutes Missed Time Reason: Patient fatigue  Short Term Goals: Week 3:  OT Short Term Goal 1 (Week 3): Pt will tolerate sitting in wc or recliner for 1 hour in between therapy sessions OT Short Term Goal 2 (Week 3): Pt will complete transfer to commode with LRAD and Mod A of 1 caregiver OT Short Term Goal 3 (Week 3): Pt will tolerate leaning forward at the sink to shave OT Short Term Goal 4 (Week 3): Pt will tolerate wearing scrotal sling during transfer.  Skilled Therapeutic Interventions/Progress Updates:    Pt greeted semi-reclined in bed asleep, easy to wake, but complains of scrotal pain, R LE pain, and being NPO for possible urology procedure. Pt very frustrated that he has not eaten and refuses to participate in therapy. OT reviewed goals for therapy and pt stated "sweetie, you're not getting me to do a damn thing today." OT donned LLE shrinker and left pt semi-reclined in bed with needs met.   Session 2 Pt declined to participate in therapy 2/2 pain, fatigue, and being NPO. OT to follow up per plan of care.  Therapy Documentation Precautions:  Precautions Precautions: Fall, Other (comment) Precaution Comments: L BKA with prosthesis; significant scrotal edema Restrictions Weight Bearing Restrictions: No General: Session 1 General OT Amount of Missed Time: 49 Minutes Session 2 General OT Amount of Missed Time: 30 Minutes Pain: Pain Assessment Pain Scale: 0-10 Pain Score: 8  Pain Type: Acute pain Pain Location: Leg Pain Orientation: Right Pain Intervention(s): REPOSITIONED);Emotional support   Therapy/Group: Individual  Therapy  Valma Cava 02/28/2019, 9:59 AM

## 2019-02-28 NOTE — Progress Notes (Signed)
Patient ID: Mieczyslaw Zappa, male   DOB: 1970/07/29, 48 y.o.   MRN: [id number] S: Mr. Breaux was not happy today as he has been npo since midnight and is "starving".  He declined PT due to being uncomfortable.  Bladder scan was 325 cc this am and is getting bladder scans every 3 hours per urology O:BP 138/78 (BP Location: Right Arm)   Pulse 84   Temp 98.4 F (36.9 C) (Oral)   Resp 18   Ht 5\' 10"  (1.778 m)   Wt (!) 143.9 kg   SpO2 97%   BMI 45.52 kg/m   Intake/Output Summary (Last 24 hours) at 02/28/2019 1231 Last data filed at 02/28/2019 [id number] Gross per 24 hour  Intake 400 ml  Output 6000 ml  Net -5600 ml   Intake/Output: I/O last 3 completed shifts: In: 67 [P.O.:880] Out: 6105 [Urine:105; Other:6000]  Intake/Output this shift:  No intake/output data recorded. Weight change: 0.8 kg Gen: obese HM in NAD CVS: no rub Resp: decreased BS at bases Abd: +BS, + abd wall edema Ext: right lower ext with dressing, LBKA, 1+ edema  Recent Labs  Lab 02/21/19 1256 02/23/19 1338 02/25/19 1157 02/27/19 1528  NA 129* 125* 126* 126*  K 4.0 4.6 4.3 4.5  CL 94* 90* 91* 90*  CO2 25 24 23 24   GLUCOSE 144* 133* 135* 139*  BUN 32* 36* 40* 42*  CREATININE 5.45* 6.18* 6.13* 6.42*  ALBUMIN 1.6* 1.6* 1.6* 1.6*  CALCIUM 7.7* 7.9* 7.9* 8.0*  PHOS 3.1 3.6 3.9 4.3   Liver Function Tests: Recent Labs  Lab 02/23/19 1338 02/25/19 1157 02/27/19 1528  ALBUMIN 1.6* 1.6* 1.6*   No results for input(s): LIPASE, AMYLASE in the last 168 hours. No results for input(s): AMMONIA in the last 168 hours. CBC: Recent Labs  Lab 02/21/19 1256 02/23/19 1338 02/25/19 1157 02/27/19 1529 02/28/19 0556  WBC 12.4* 13.7* 13.3* 13.0* 11.7*  HGB 7.3* 7.5* 7.6* 7.6* 8.0*  HCT 24.0* 24.7* 24.8* 24.5* 25.5*  MCV 94.9 95.4 93.2 93.5 91.7  PLT 399 402* 380 323 319   Cardiac Enzymes: No results for input(s): CKTOTAL, CKMB, CKMBINDEX, TROPONINI in the last 168 hours. CBG: Recent Labs  Lab 02/22/19 1233  02/22/19 1712  GLUCAP 98 131*    Iron Studies: No results for input(s): IRON, TIBC, TRANSFERRIN, FERRITIN in the last 72 hours. Studies/Results: No results found. . sodium chloride   Intravenous Once  . atorvastatin  40 mg Oral Daily  . calcitRIOL  0.25 mcg Oral Daily  . calcium carbonate  1 tablet Oral BID WC  . cephALEXin  250 mg Oral Q12H  . Chlorhexidine Gluconate Cloth  6 each Topical Q0600  . citalopram  40 mg Oral Daily  . collagenase   Topical Daily  . darbepoetin (ARANESP) injection - DIALYSIS  150 mcg Intravenous Q Tue-HD  . furosemide  160 mg Oral BID  . gabapentin  300 mg Oral QHS  . heparin  5,000 Units Subcutaneous Q8H  . lidocaine   Topical 5 X Daily  . lidocaine-EPINEPHrine  20 mL Infiltration Once  . Melatonin  4.5 mg Oral QHS  . pantoprazole  40 mg Oral Daily  . polyethylene glycol  17 g Oral Daily  . tamsulosin  0.4 mg Oral QPC breakfast    BMET    Component Value Date/Time   NA 126 (L) 02/27/2019 1528   K 4.5 02/27/2019 1528   CL 90 (L) 02/27/2019 1528   CO2 24 02/27/2019 1528  GLUCOSE 139 (H) 02/27/2019 1528   BUN 42 (H) 02/27/2019 1528   CREATININE 6.42 (H) 02/27/2019 1528   CALCIUM 8.0 (L) 02/27/2019 1528   GFRNONAA 9 (L) 02/27/2019 1528   GFRAA 11 (L) 02/27/2019 1528   CBC    Component Value Date/Time   WBC 11.7 (H) 02/28/2019 0556   RBC 2.78 (L) 02/28/2019 0556   HGB 8.0 (L) 02/28/2019 0556   HCT 25.5 (L) 02/28/2019 0556   PLT 319 02/28/2019 0556   MCV 91.7 02/28/2019 0556   MCH 28.8 02/28/2019 0556   MCHC 31.4 02/28/2019 0556   RDW 13.7 02/28/2019 0556   LYMPHSABS 1.4 02/09/2019 1500   MONOABS 1.6 (H) 02/09/2019 1500   EOSABS 0.8 (H) 02/09/2019 1500   BASOSABS 0.1 02/09/2019 1500     Assessment/Plan:  1. ESRD- Initiated HD on 01/31/19 and has been tolerating HD well and will eventually go to [facility] after discharge on a TTS schedule.   1. Given anasarca will continue with daily HD for volume management  2. Will  get back on TTS schedule by this weekend 2. Volume overload- has significant idwg and scrotal and abdominal wall edema.  his weight is up 9kg since HD yesterday.  Question bed scale and will need to weigh him standing if possible 1. Will plan for daily HD to help with volume management but I did stress the need for fluid restriction 3. 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  Urology has been consulted and plan for removal of foley catheter today.  If he fails trial off of cath will likely require operative procedure.  Will plan for daily HD to help improve edema 4. Anemia of ESRD- h/h stable.  On ESA 5. SHPTH- on calcitriol 6. Latent syphilis- per primary svc 7. 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  Wound care consulted. 8. Deconditioning- cont with PT/OT  Donetta Potts, MD [facility] [phone]

## 2019-03-01 ENCOUNTER — Inpatient Hospital Stay (HOSPITAL_COMMUNITY): Payer: Medicare Other

## 2019-03-01 ENCOUNTER — Inpatient Hospital Stay (HOSPITAL_COMMUNITY): Payer: Medicare Other | Admitting: Physical Therapy

## 2019-03-01 LAB — RENAL FUNCTION PANEL
Albumin: 1.8 g/dL — ABNORMAL LOW (ref 3.5–5.0)
Anion gap: 12 (ref 5–15)
BUN: 23 mg/dL — ABNORMAL HIGH (ref 6–20)
CO2: 24 mmol/L (ref 22–32)
Calcium: 8.1 mg/dL — ABNORMAL LOW (ref 8.9–10.3)
Chloride: 94 mmol/L — ABNORMAL LOW (ref 98–111)
Creatinine, Ser: 4.49 mg/dL — ABNORMAL HIGH (ref 0.61–1.24)
GFR calc Af Amer: 17 mL/min — ABNORMAL LOW (ref >60)
GFR calc non Af Amer: 15 mL/min — ABNORMAL LOW (ref >60)
Glucose, Bld: 158 mg/dL — ABNORMAL HIGH (ref 70–99)
Phosphorus: 3.4 mg/dL (ref 2.5–4.6)
Potassium: 3.7 mmol/L (ref 3.5–5.1)
Sodium: 130 mmol/L — ABNORMAL LOW (ref 135–145)

## 2019-03-01 LAB — CBC
HCT: 27.5 % — ABNORMAL LOW (ref 39.0–52.0)
Hemoglobin: 8.5 g/dL — ABNORMAL LOW (ref 13.0–17.0)
MCH: 28.9 pg (ref 26.0–34.0)
MCHC: 30.9 g/dL (ref 30.0–36.0)
MCV: 93.5 fL (ref 80.0–100.0)
Platelets: 302 10*3/uL (ref 150–400)
RBC: 2.94 MIL/uL — ABNORMAL LOW (ref 4.22–5.81)
RDW: 13.7 % (ref 11.5–15.5)
WBC: 10.2 10*3/uL (ref 4.0–10.5)
nRBC: 0 % (ref 0.0–0.2)

## 2019-03-01 MED ORDER — HEPARIN SODIUM (PORCINE) 1000 UNIT/ML IJ SOLN
INTRAMUSCULAR | Status: AC
  Administered 2019-03-01: 3800 [IU] via INTRAVENOUS_CENTRAL
  Filled 2019-03-01: qty 4

## 2019-03-01 MED ORDER — HEPARIN SODIUM (PORCINE) 1000 UNIT/ML DIALYSIS: 20.0000 [IU]/kg | INTRAMUSCULAR | Status: DC | PRN

## 2019-03-01 MED ORDER — HEPARIN SODIUM (PORCINE) 1000 UNIT/ML DIALYSIS
20.0000 [IU]/kg | INTRAMUSCULAR | Status: DC | PRN
  Administered 2019-03-01: 17:00:00 3800 [IU] via INTRAVENOUS_CENTRAL
  Filled 2019-03-01 (×2): qty 3

## 2019-03-01 MED ORDER — HEPARIN SODIUM (PORCINE) 1000 UNIT/ML DIALYSIS
20.0000 [IU]/kg | INTRAMUSCULAR | Status: DC | PRN
  Filled 2019-03-01: qty 3

## 2019-03-01 MED ORDER — BETHANECHOL CHLORIDE 25 MG PO TABS
25.0000 mg | ORAL_TABLET | Freq: Three times a day (TID) | ORAL | Status: DC
  Administered 2019-03-01 – 2019-03-14 (×32): 25 mg via ORAL
  Filled 2019-03-01 (×33): qty 1

## 2019-03-01 NOTE — Progress Notes (Signed)
  Subjective: Patient reports no urgency to urinate currently. Not in pain. Just finished UF in HDU. Objective: Vital signs in last 24 hours: Temp:  [98.4 F (36.9 C)-99 F (37.2 C)] 98.8 F (37.1 C) (08/12 1728) Pulse Rate:  [80-95] 86 (08/12 1728) Resp:  [16-20] 19 (08/12 1728) BP: (113-148)/(69-102) 118/80 (08/12 1728) SpO2:  [96 %] 96 % (08/12 1728) Weight:  [128.7 kg-131.8 kg] 128.7 kg (08/12 1728)  Intake/Output from previous day: 08/11 0701 - 08/12 0700 In: 0  Out: 3500  Intake/Output this shift: Total I/O In: 360 [P.O.:360] Out: 3500 [Other:3500]  Physical Exam:  Constitutional: Vital signs reviewed. WD WN in NAD   Eyes: PERRL, No scleral icterus.   Pulmonary/Chest: Normal effort Abdominal: Soft, obese. Mild urgency w/ sp pressure but no discomfort. Genitourinary: Buried penis. Some irritation of distal foreskin.     Lab Results: Recent Labs    02/27/19 1529 02/28/19 0556 03/01/19 0953  HGB 7.6* 8.0* 8.5*  HCT 24.5* 25.5* 27.5*   BMET Recent Labs    02/27/19 1528 03/01/19 0953  NA 126* 130*  K 4.5 3.7  CL 90* 94*  CO2 24 24  GLUCOSE 139* 158*  BUN 42* 23*  CREATININE 6.42* 4.49*  CALCIUM 8.0* 8.1*   No results for input(s): LABPT, INR in the last 72 hours. No results for input(s): LABURIN in the last 72 hours. Results for orders placed or performed during the hospital encounter of 02/08/19  Urine Culture     Status: Abnormal   Collection Time: 02/13/19  1:25 PM   Specimen: Urine, Random  Result Value Ref Range Status   Specimen Description URINE, RANDOM  Final   Special Requests NONE  Final   Culture (A)  Final    <10,000 COLONIES/mL INSIGNIFICANT GROWTH Performed at [facility], [place] [address], [place],  [address]    Report Status 02/14/2019 FINAL  Final    Studies/Results: No results found.  Assessment/Plan:   Still no voluntary void after cath removal, elevated bladder volume but no discomfort.   Will start  bethanechol to hopefully stimulate bladder contraction  I'll check in on him in am. 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 Seeing that he is on chronic HD, no physiologic reason @ present to urgently intervene   LOS: 21 days   Jorja Loa 03/01/2019, 5:50 PM

## 2019-03-01 NOTE — Progress Notes (Signed)
[name], male   DOB: 30-Apr-1971, 48 y.o.   MRN: [id number] S: Feeling a little better today O:BP 134/74 (BP Location: Right Arm)   Pulse 84   Temp 99 F (37.2 C)   Resp 20   Ht 5\' 10"  (1.778 m)   Wt 130.4 kg   SpO2 96%   BMI 41.25 kg/m   Intake/Output Summary (Last 24 hours) at 03/01/2019 1214 Last data filed at 03/01/2019 0750 Gross per 24 hour  Intake 360 ml  Output 3500 ml  Net -3140 ml   Intake/Output: I/O last 3 completed shifts: In: 0  Out: 3500 [Other:3500]  Intake/Output this shift:  Total I/O In: 360 [P.O.:360] Out: -  Weight change: -8.2 kg Gen: NAD CVS: no rub Resp: cta Abd: +BS, soft, NT Ext: s/p L BKA with edema, right leg dressing in place, 2+ presacral edema/abdominal wall edema  Recent Labs  Lab 02/23/19 1338 02/25/19 1157 02/27/19 1528 03/01/19 0953  NA 125* 126* 126* 130*  K 4.6 4.3 4.5 3.7  CL 90* 91* 90* 94*  CO2 24 23 24 24   GLUCOSE 133* 135* 139* 158*  BUN 36* 40* 42* 23*  CREATININE 6.18* 6.13* 6.42* 4.49*  ALBUMIN 1.6* 1.6* 1.6* 1.8*  CALCIUM 7.9* 7.9* 8.0* 8.1*  PHOS 3.6 3.9 4.3 3.4   Liver Function Tests: Recent Labs  Lab 02/25/19 1157 02/27/19 1528 03/01/19 0953  ALBUMIN 1.6* 1.6* 1.8*   No results for input(s): LIPASE, AMYLASE in the last 168 hours. No results for input(s): AMMONIA in the last 168 hours. CBC: Recent Labs  Lab 02/23/19 1338 02/25/19 1157 02/27/19 1529 02/28/19 0556 03/01/19 0953  WBC 13.7* 13.3* 13.0* 11.7* 10.2  HGB 7.5* 7.6* 7.6* 8.0* 8.5*  HCT 24.7* 24.8* 24.5* 25.5* 27.5*  MCV 95.4 93.2 93.5 91.7 93.5  PLT 402* 380 323 319 302   Cardiac Enzymes: No results for input(s): CKTOTAL, CKMB, CKMBINDEX, TROPONINI in the last 168 hours. CBG: Recent Labs  Lab 02/22/19 1233 02/22/19 1712  GLUCAP 98 131*    Iron Studies: No results for input(s): IRON, TIBC, TRANSFERRIN, FERRITIN in the last 72 hours. Studies/Results: No results found. . sodium chloride   Intravenous Once   . atorvastatin  40 mg Oral Daily  . calcitRIOL  0.25 mcg Oral Daily  . calcium carbonate  1 tablet Oral BID WC  . cephALEXin  250 mg Oral Q12H  . Chlorhexidine Gluconate Cloth  6 each Topical Q0600  . citalopram  40 mg Oral Daily  . collagenase   Topical Daily  . darbepoetin (ARANESP) injection - DIALYSIS  150 mcg Intravenous Q Tue-HD  . gabapentin  300 mg Oral QHS  . heparin  5,000 Units Subcutaneous Q8H  . lidocaine  20 mL Other Once  . lidocaine   Topical 5 X Daily  . Melatonin  4.5 mg Oral QHS  . pantoprazole  40 mg Oral Daily  . polyethylene glycol  17 g Oral Daily  . tamsulosin  0.4 mg Oral QPC breakfast    BMET    Component Value Date/Time   NA 130 (L) 03/01/2019 0953   K 3.7 03/01/2019 0953   CL 94 (L) 03/01/2019 0953   CO2 24 03/01/2019 0953   GLUCOSE 158 (H) 03/01/2019 0953   BUN 23 (H) 03/01/2019 0953   CREATININE 4.49 (H) 03/01/2019 0953   CALCIUM 8.1 (L) 03/01/2019 0953   GFRNONAA 15 (L) 03/01/2019 0953   GFRAA 17 (L) 03/01/2019 0953   CBC  Component Value Date/Time   WBC 10.2 03/01/2019 0953   RBC 2.94 (L) 03/01/2019 0953   HGB 8.5 (L) 03/01/2019 0953   HCT 27.5 (L) 03/01/2019 0953   PLT 302 03/01/2019 0953   MCV 93.5 03/01/2019 0953   MCH 28.9 03/01/2019 0953   MCHC 30.9 03/01/2019 0953   RDW 13.7 03/01/2019 0953   LYMPHSABS 1.4 02/09/2019 1500   MONOABS 1.6 (H) 02/09/2019 1500   EOSABS 0.8 (H) 02/09/2019 1500   BASOSABS 0.1 02/09/2019 1500     Assessment/Plan:  1. ESRD- Initiated HD on 01/31/19 and has been tolerating HD well and will eventually go to [facility] after discharge on a TTS schedule.  1. Given anasarca will continue with daily HD for volume management  2. Will get back on TTS schedule by this weekend 2. Volume overload- has significant idwg and scrotal and abdominal wall edema. his weight is up 9kg since HD yesterday.  Question bed scale and will need to weigh him standing if possible 1. Will plan for daily HD to  help with volume management but I did stress the need for fluid restriction 2. He needs to be able to sit up in a wheelchair for outpatient dialysis and he reports that his scrotal edema prevents this, hence aggressive HD/UF. 3. 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 Urology has been consulted and plan for removal of foley catheter today. If he fails trial off of cath will likely require operative procedure. Will plan for daily HD to help improve edema 1. Will stop IV lasix as his UOP has declined since starting on dialysis. 4. Anemia of ESRD- h/h stable. On ESA 5. SHPTH- on calcitriol 6. Latent syphilis- per primary svc 7. 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 Wound care consulted. 8. Deconditioning- cont with PT/OT Donetta Potts, MD [facility] [phone]

## 2019-03-01 NOTE — Patient Care Conference (Signed)
Inpatient RehabilitationTeam Conference and Plan of Care Update Date: 02/28/2019   Time: 10:45 AM    Patient Name: Michael Riggs      Medical Record Number: [id number]  Date of Birth: 12/23/1970 Sex: Male         Room/Bed: 4M03C/4M03C-01 Payor Info: Payor: [employer] scientist / Plan: [employer] MEDICARE / Product Type: *No Product type* /    Admitting Diagnosis: 2. ABI Team  Debility, malaise; 17-19days  Admit Date/Time:  02/08/2019 11:54 PM Admission Comments: No comment available   Primary Diagnosis:  <principal problem not specified> Principal Problem: <principal problem not specified>  Patient Active Problem List   Diagnosis Date Noted  . Pruritus   . Scrotal pain   . Labile blood pressure   . Scrotal edema   . Status post below-knee amputation of left lower extremity ([place])   . Slow transit constipation   . Leukocytosis   . Sleep disturbance   . Essential hypertension   . Anemia of chronic disease   . Controlled type 2 diabetes mellitus with hyperglycemia, without long-term current use of insulin ([place])   . ESRD on dialysis ([place])   . Urinary retention   . Debility 02/09/2019  . Fluid overload 02/04/2019  . Hyperkalemia 01/30/2019  . ARF (acute renal failure) ([place]) 01/30/2019  . Hypokalemia 01/30/2019  . Hypoglycemia 01/30/2019  . Syphilis 01/30/2019  . Macrocytic anemia 01/30/2019  . End stage renal disease ([place])   . PDR (proliferative diabetic retinopathy) ([place]) 12/14/2013  . Venous hypertension 09/22/2013  . Diabetes mellitus type 2 in obese ([place]) 07/04/2013  . Habitual alcohol use 07/04/2013  . Obesity, Class II, BMI 35-39.9, with comorbidity 07/04/2013    Expected Discharge Date: Expected Discharge Date: (TBD)  Team Members Present: Physician leading conference: Dr. Alger Simons Social Worker Present: Lennart Pall, LCSW Nurse Present: Judee Clara, LPN PT Present: Burnard Bunting, PT OT Present: Cherylynn Ridges, OT PPS Coordinator present : Gunnar Fusi,  SLP     Current Status/Progress Goal Weekly Team Focus  Medical   ongoing pain, edema/scrotal edema remains present. foley removed, no voiding---likely will require foley replacement  bladder/voiding plan finalization  see above.   Bowel/Bladder   continent of bowel, scan less than <500cc with daily dialysis  continent of bowel and bladder, not requiring I+O cath or daily dialysis  assess urinary retention and need for replacement of foley once on regular dialysis   Swallow/Nutrition/ Hydration             ADL's   Max A overall, scrotal edema and scrotal pain not improving  Min A wc level  working on upright tolerance, scrotal edema, and stand/pivot or slideboard transfers, dc planning   Mobility   max assist overall - bed mobility and transfers via slide board or stand pivot (assist primarily 2/2 scrotal/abdominal edema creating significant posterior lean bias)  CGA-supervision, w/c level  OOB tolerance, getting to full 90 deg upright in order to sit in w/c, edema mangement   Communication             Safety/Cognition/ Behavioral Observations            Pain   Pain in rt leg/scrotum scheduled ultram and prn tylenol  Pain at or below level 4  assess pain q shift and offer prn medication   Skin   continue daily dressing changes to ulcers with santyl  no new skin issues and healing ulcers on leg no longer requiring santyl ointment  assess skin  q shift and complete daily dressing changes    Rehab Goals Patient on target to meet rehab goals: No Rehab Goals Revised: swelling continues to interfere with pt's ability to do therapy *See Care Plan and progress notes for long and short-term goals.     Barriers to Discharge  Current Status/Progress Possible Resolutions Date Resolved   Physician    Medical stability  pain, anxiety, ongoing edema     see medical progress notes      Nursing                  PT  Wound Care;Hemodialysis;Weight;Insurance for SNF coverage;Behavior                  OT                  SLP                SW                Discharge Planning/Teaching Needs:  Pt to return home with mother who is unable to assist.  Brother and sister-in-law are now in the home as well and able to provide 24/7 assistance.  Teaching needs TBD.   Team Discussion:  Foley out yesterday;  Still NPO for urology may need to replace - monitoring.  Scrotal edema continues to be significant.  Only able to tolerate ~ 30 degree elevation of head.  Goals is at 72.  Reluctant to use scrotal sling.  Hopeful that receiving qd HD will encourage decreased swelling.  Revisions to Treatment Plan:  NA    Continued Need for Acute Rehabilitation Level of Care: The patient requires daily medical management by a physician with specialized training in physical medicine and rehabilitation for the following conditions: Daily direction of a multidisciplinary physical rehabilitation program to ensure safe treatment while eliciting the highest outcome that is of practical value to the patient.: Yes Daily medical management of patient stability for increased activity during participation in an intensive rehabilitation regime.: Yes Daily analysis of laboratory values and/or radiology reports with any subsequent need for medication adjustment of medical intervention for : Mood/behavior problems;Renal problems;Urological problems   I attest that I was present, lead the team conference, and concur with the assessment and plan of the team.   Lennart Pall 03/02/2019, 2:08 PM    Team conference was held via web/ teleconference due to [place] - 19

## 2019-03-01 NOTE — Progress Notes (Signed)
Physical Therapy Session Note  Patient Details  Name: Michael Riggs MRN: [id number] Date of Birth: 1971-05-30  Today's Date: 03/01/2019 PT Individual Time: 1030-1145 PT Individual Time Calculation (min): 75 min   Short Term Goals: Week 3:  PT Short Term Goal 1 (Week 3): =LTGs due to ELOS  Skilled Therapeutic Interventions/Progress Updates:   Pt in supine and agreeable to therapy w/ encouragement, pain 10/10 in scrotum. After prolonged discussion regarding the benefits, pt agreeable to attempt EOB sitting. Supine>sit w/ min assist and maintain static sitting w/ min assist for ~25 sec. Pt yelling out in pain, however able to maintain good breathing w/ cues. 25 sec was his max, min assist back to supine. Pt agreeable to get OOB only if he can use maximove. R/L rolling w/ supervision while therapist donned sling. Maximove transfer to recliner after which point pt stated "I think I've pooped". Maximove back to supine, pt incontinent of bowel, R/L rolling w/ supervision while therapist performed pericare and brief management. With max encouragement, pt agreeable to attempt OOB activity again if he can shave. Maximove transfer to recliner and set-up at sink to shave face. Set-up in recliner sideways to sink so pt can reach water and sink, handheld mirror in other hand to see his face. Pt performed all shaving tasks w/ supervision in reclined position in recliner ~45-50 deg angle. Total assist for very bottom of neck 2/2 body habitus. Pt agreeable to stay up in recliner until dialysis. Ended session in recliner, all needs in reach.   Therapy Documentation Precautions:  Precautions Precautions: Fall, Other (comment) Precaution Comments: L BKA with prosthesis; significant scrotal edema Restrictions Weight Bearing Restrictions: No  Therapy/Group: Individual Therapy  Osias Resnick [name] 03/01/2019, 11:56 AM

## 2019-03-01 NOTE — Progress Notes (Signed)
Occupational Therapy Session Note  Patient Details  Name: Michael Riggs MRN: [id number] Date of Birth: 19-Mar-1971  Pt was taken down to HD during scheduled OT session. 60 minutes missed. OT will continue to follow.   Laverle Hobby, OTR/L

## 2019-03-01 NOTE — Progress Notes (Signed)
[place] PHYSICAL MEDICINE & REHABILITATION PROGRESS NOTE   Subjective/Complaints: Patient seen sitting up in bed this morning, he states he slept well overnight.  He is very upset regarding being n.p.o. yesterday regarding not having his Foley placed.  Discussed with nursing retaining 550 cc of urine, but patient refusing to be cathed.  He states the only way that he will allow catheterization is under anesthesia.  ROS: Denies CP, SOB, N/V/D Friday Objective:   No results found. Recent Labs    02/27/19 1529 02/28/19 0556  WBC 13.0* 11.7*  HGB 7.6* 8.0*  HCT 24.5* 25.5*  PLT 323 319   Recent Labs    02/27/19 1528  NA 126*  K 4.5  CL 90*  CO2 24  GLUCOSE 139*  BUN 42*  CREATININE 6.42*  CALCIUM 8.0*    Intake/Output Summary (Last 24 hours) at 03/01/2019 0908 Last data filed at 02/28/2019 1649 Gross per 24 hour  Intake 0 ml  Output 3500 ml  Net -3500 ml     Physical Exam: Vital Signs Blood pressure 134/74, pulse 84, temperature 99 F (37.2 C), resp. rate 20, height 5\' 10"  (1.778 m), weight 130.4 kg, SpO2 96 %. Constitutional: No distress . Vital signs reviewed. HENT: Normocephalic.  Atraumatic. Eyes: EOMI. No discharge. Cardiovascular: No JVD. RRR Respiratory: Normal effort.  No stridor. Clear to auscultation.  GI: Distended. BS+ Skin: RLE dressing c/d/i. Left BKA healed. Psych: Normal mood.  Normal behavior. Musc: Scrotal edema. Psych: Easily agitated Neurological: Alert. Motor:  RLE: 4-4+/5 proximal to distal, stable Left lower extremity: Hip flexion, knee extension 4+/5, stable  Assessment/Plan: 1. Functional deficits secondary to debility which require 3+ hours per day of interdisciplinary therapy in a comprehensive inpatient rehab setting.  Physiatrist is providing close team supervision and 24 hour management of active medical problems listed below.  Physiatrist and rehab team continue to assess barriers to discharge/monitor patient progress  toward functional and medical goals  Care Tool:  Bathing    Body parts bathed by patient: Right arm, Left arm, Chest, Abdomen, Face   Body parts bathed by helper: Front perineal area, Buttocks, Right upper leg, Left upper leg, Right lower leg Body parts n/a: Left lower leg   Bathing assist Assist Level: Maximal Assistance - Patient 24 - 49%     Upper Body Dressing/Undressing Upper body dressing Upper body dressing/undressing activity did not occur (including orthotics): N/A(patient already had a gown) What is the patient wearing?: Hospital gown only    Upper body assist Assist Level: Total Assistance - Patient < 25%    Lower Body Dressing/Undressing Lower body dressing    Lower body dressing activity did not occur: Refused What is the patient wearing?: Hospital gown only     Lower body assist Assist for lower body dressing: Total Assistance - Patient < 25%     Toileting Toileting Toileting Activity did not occur Landscape architect and hygiene only): Safety/medical concerns  Toileting assist Assist for toileting: Dependent - Patient 0%     Transfers Chair/bed transfer  Transfers assist  Chair/bed transfer activity did not occur: Safety/medical concerns  Chair/bed transfer assist level: 2 Helpers     Locomotion Ambulation   Ambulation assist   Ambulation activity did not occur: Safety/medical concerns(unable due to pain and scrotal and bladder issues)          Walk 10 feet activity   Assist  Walk 10 feet activity did not occur: Safety/medical concerns(unable due to pain and scrotal and bladder issues)  Walk 50 feet activity   Assist Walk 50 feet with 2 turns activity did not occur: Safety/medical concerns(unable due to pain and scrotal and bladder issues)         Walk 150 feet activity   Assist Walk 150 feet activity did not occur: Safety/medical concerns(unable due to pain and scrotal and bladder issues)         Walk 10 feet  on uneven surface  activity   Assist Walk 10 feet on uneven surfaces activity did not occur: Safety/medical concerns(unable due to pain and scrotal and bladder issues)         Wheelchair     Assist   Type of Wheelchair: Manual Wheelchair activity did not occur: Safety/medical concerns(unable due to pain and scrotal and bladder issues)  Wheelchair assist level: Supervision/Verbal cueing Max wheelchair distance: 25'    Wheelchair 50 feet with 2 turns activity    Assist    Wheelchair 50 feet with 2 turns activity did not occur: Safety/medical concerns(unable due to pain and scrotal and bladder issues)       Wheelchair 150 feet activity     Assist Wheelchair 150 feet activity did not occur: Safety/medical concerns(unable due to pain and scrotal and bladder issues)        Medical Problem List and Plan: 1.Debilitysecondary to end-stage renal disease/multi-medical. Hemodialysis recently initiated.  Cont CIR 2. Antithrombotics: -DVT/anticoagulation:Subcutaneous heparin -antiplatelet therapy: N/A 3. Pain Management:Neurontin 300 mg nightly, Ultram as needed  Stable 8/12 4. Mood:Celexa 40 mg daily -antipsychotic agents: N/A  -team providing support 5. Neuropsych: This patientiscapable of making decisions on hisown behalf.  Discussed with neuropsychology-appreciate recs, no SI 6. Skin/Wound Care:Scrotal sling Local care to bullae, foam dressings--assume these are related to chronic edema  Appreciate Vasc recs - nonvascular RLE wounds  Appreciate further WOC recs  Surgery performed bx: "necrotic tissue with acute inflammation" - defer to surgery recs 7. Fluids/Electrolytes/Nutrition:Routine in and outs 8.  ESRD:. Status post AV fistula 01/09/2019. Hemodialysis directed. -continue to diurese per nephro Filed Weights   02/28/19 1310 02/28/19 1649 03/01/19 0500  Weight: 132.1 kg 128.8 kg 130.4 kg    ?Reliability on 8/12 9. Diabetes mellitus. Recent hemoglobin A1c 5.0.   Controlled on 8/10  Continue to monitor 10. Anemia of chronic disease. Continue Aranesp  Hemoglobin 8.0 on 8/11, recs per nephro, labs with HD 11. History of left BKA 5 years ago in [place].   12. Hypertension. Norvasc 10 mg daily.    Labile on 8/12 13. Morbid obesity. BMI 43.78. Dietary follow-up 14. Hyperlipidemia. Lipitor 15. History of syphilis. Treated outpatient with penicillin to the healthcare department recently. Outpatient follow-up 16. Urine retention:   Foley out. No void, scan for 550cc  urology wants him to reach 800cc, per note review today, before considering replacement of cath, await further recs today  continues on keflex for bladder proph  flomax to help with emptying  continue lidocaine gel for urethral meatus 17.  Sleep disturbance  Melatonin 4.5 mg nightly   Cont CPAP 18.  Leukocytosis  WBCs 11.7 on 8/11  Afebrile  Continue to monitor 19.  Constipation  Bowel meds increased on 7/28, decreased on 7/31, decreased on 8/5  Improving 20.  Pruritus--generally improved  Local care, air to back  Benadryl   Improving  LOS: 21 days A FACE TO FACE EVALUATION WAS PERFORMED  Takyra Cantrall Lorie Phenix 03/01/2019, 9:08 AM

## 2019-03-01 NOTE — Progress Notes (Signed)
Physical Therapy Session Note  Patient Details  Name: Michael Riggs MRN: [id number] Date of Birth: [birth year]  Today's Date: 03/01/2019 PT Individual Time: 0850-0900 PT Individual Time Calculation (min): 10 min   Short Term Goals: Week 3:  PT Short Term Goal 1 (Week 3): =LTGs due to ELOS  Skilled Therapeutic Interventions/Progress Updates: Upon entering room pt eating breakfast, indicating currently in too much pain to participate in therapy. Pt did have HOB at max height (64degrees) and pt indicating is trying to increase tolerance in upright position. Pt set up for oral care after breakfast which pt performed independently and PTA donned L shrinker. Pt missed 35 min skilled therapy due to pain.      Therapy Documentation Precautions:  Precautions Precautions: Fall, Other (comment) Precaution Comments: L BKA with prosthesis; significant scrotal edema Restrictions Weight Bearing Restrictions: No    Therapy/Group: Individual Therapy  Michael Riggs  Charissa Knowles, PTA  03/01/2019, 4:14 PM

## 2019-03-01 NOTE — Progress Notes (Signed)
Patient refused in/out cath for no void because of excessive pain in the area. Continue plan of care.  Audie Clear, LPN

## 2019-03-01 NOTE — Progress Notes (Addendum)
Urology Progress Note:   Catheter was removed at 10 AM on 02/27/2019.  The patient has not yet voided.  Last bladder scan was yesterday evening, about 460 cc.  He was making between 250-500 cc of urine per day prior to Foley removal  This morning the patient says he may have dribble some urine overnight but had no specific void.  Does NOT feel any suprapubic fullness or pressure.  Still needs bladder scan this morning.    Plan: - Ok for diet, no need for patient n.p.o. - Bladder scan now - If bladder scans become unsafe levels (>800cc) or patient is in pain from bladder fullness, next stop would likely be a procedure at bedside.  TBD  Please call [phone] with questions or concerns  -Michael Riggs, urology resident I have seen pt and agree with note

## 2019-03-02 ENCOUNTER — Inpatient Hospital Stay (HOSPITAL_COMMUNITY): Payer: Medicare Other | Admitting: Physical Therapy

## 2019-03-02 ENCOUNTER — Inpatient Hospital Stay (HOSPITAL_COMMUNITY): Payer: Medicare Other | Admitting: Occupational Therapy

## 2019-03-02 ENCOUNTER — Inpatient Hospital Stay (HOSPITAL_COMMUNITY): Payer: Medicare Other

## 2019-03-02 LAB — HEPATITIS B SURFACE ANTIGEN: Hepatitis B Surface Ag: NEGATIVE

## 2019-03-02 LAB — CBC
HCT: 25.6 % — ABNORMAL LOW (ref 39.0–52.0)
Hemoglobin: 7.6 g/dL — ABNORMAL LOW (ref 13.0–17.0)
MCH: 28.5 pg (ref 26.0–34.0)
MCHC: 29.7 g/dL — ABNORMAL LOW (ref 30.0–36.0)
MCV: 95.9 fL (ref 80.0–100.0)
Platelets: 324 10*3/uL (ref 150–400)
RBC: 2.67 MIL/uL — ABNORMAL LOW (ref 4.22–5.81)
RDW: 14 % (ref 11.5–15.5)
WBC: 12.6 10*3/uL — ABNORMAL HIGH (ref 4.0–10.5)
nRBC: 0 % (ref 0.0–0.2)

## 2019-03-02 LAB — RENAL FUNCTION PANEL
Albumin: 1.7 g/dL — ABNORMAL LOW (ref 3.5–5.0)
Anion gap: 12 (ref 5–15)
BUN: 23 mg/dL — ABNORMAL HIGH (ref 6–20)
CO2: 24 mmol/L (ref 22–32)
Calcium: 7.9 mg/dL — ABNORMAL LOW (ref 8.9–10.3)
Chloride: 93 mmol/L — ABNORMAL LOW (ref 98–111)
Creatinine, Ser: 4.28 mg/dL — ABNORMAL HIGH (ref 0.61–1.24)
GFR calc Af Amer: 18 mL/min — ABNORMAL LOW (ref >60)
GFR calc non Af Amer: 15 mL/min — ABNORMAL LOW (ref >60)
Glucose, Bld: 180 mg/dL — ABNORMAL HIGH (ref 70–99)
Phosphorus: 2.8 mg/dL (ref 2.5–4.6)
Potassium: 4 mmol/L (ref 3.5–5.1)
Sodium: 129 mmol/L — ABNORMAL LOW (ref 135–145)

## 2019-03-02 MED ORDER — TRAMADOL HCL 50 MG PO TABS
ORAL_TABLET | ORAL | Status: AC
  Filled 2019-03-02: qty 1

## 2019-03-02 MED ORDER — HEPARIN SODIUM (PORCINE) 1000 UNIT/ML IJ SOLN
INTRAMUSCULAR | Status: AC
  Administered 2019-03-02: 3800 [IU] via INTRAVENOUS_CENTRAL
  Filled 2019-03-02: qty 4

## 2019-03-02 MED ORDER — HEPARIN SODIUM (PORCINE) 1000 UNIT/ML DIALYSIS
20.0000 [IU]/kg | INTRAMUSCULAR | Status: DC | PRN
  Administered 2019-03-02: 18:00:00 3800 [IU] via INTRAVENOUS_CENTRAL
  Filled 2019-03-02 (×2): qty 3

## 2019-03-02 MED ORDER — CAMPHOR-MENTHOL 0.5-0.5 % EX LOTN
TOPICAL_LOTION | CUTANEOUS | Status: DC | PRN
  Administered 2019-03-02 – 2019-03-03 (×2): via TOPICAL
  Administered 2019-03-04: 1 via TOPICAL
  Administered 2019-03-06: 21:00:00 via TOPICAL
  Filled 2019-03-02: qty 222

## 2019-03-02 MED ORDER — HYDROCERIN EX CREA
TOPICAL_CREAM | Freq: Two times a day (BID) | CUTANEOUS | Status: DC
  Administered 2019-03-02 – 2019-03-14 (×23): via TOPICAL
  Filled 2019-03-02: qty 113

## 2019-03-02 NOTE — Progress Notes (Signed)
03/02/2019 2030: Jordan Hawks, LPN approaches charge nurse with issue concerning pt. LPN reports that pt is making lewd and inappropriate comments while she is attempting to provide patient care. Charge asks LPN how long has this occurred. LPN states, "for the past 3 nights." Charge asks LPN has she expressed to pt that the comments are inappropriate and make her feel uncomfortable. LPN states, "Yes, I did tonight." Charge enters pt room and reiterates that comments made to his nurse made her feel uncomfortable and inappropriate language will not be tolerated on this unit. Charge continued to educate pt on verbal boundaries. Pt stated that he understood. Charge tells LPN to use the "buddy system" when entering the pt's room and to alert if behavior continues.   Will suggest a behavior modification plan for pt to day shift. Charge will continue to educate and monitor.

## 2019-03-02 NOTE — Plan of Care (Signed)
LTGs updated to supervision assist overall at w/c level for consistency across all LTGs  Burnard Bunting, PT, DPT  Problem: Sit to Stand Goal: LTG:  Patient will perform sit to stand with assistance level (PT) Description: LTG:  Patient will perform sit to stand with assistance level (PT) Flowsheets (Taken 03/02/2019 1146) LTG: PT will perform sit to stand in preparation for functional mobility with assistance level: (upgraded 8/13 - AAT) Supervision/Verbal cueing Note: Upgraded 8/13 -AAT   Problem: RH Bed Mobility Goal: LTG Patient will perform bed mobility with assist (PT) Description: LTG: Patient will perform bed mobility with assistance, with/without cues (PT). Flowsheets (Taken 03/02/2019 1146) LTG: Pt will perform bed mobility with assistance level of: (downgraded 8/13 - AAT) Supervision/Verbal cueing Note: Downgraded 8/13 - AAT   Problem: RH Wheelchair Mobility Goal: LTG Patient will propel w/c in controlled environment (PT) Description: LTG: Patient will propel wheelchair in controlled environment, # of feet with assist (PT) Flowsheets (Taken 03/02/2019 1146) LTG: Pt will propel w/c in controlled environ  assist needed:: (downgraded 8/13 - AAT) Supervision/Verbal cueing Note: Downgraded 8/13 - AAT Goal: LTG Patient will propel w/c in home environment (PT) Description: LTG: Patient will propel wheelchair in home environment, # of feet with assistance (PT). Flowsheets (Taken 03/02/2019 1146) LTG: Pt will propel w/c in home environ  assist needed:: (downgraded 8/13 -AAT) Supervision/Verbal cueing Note: Downgraded 8/13 - AAT   Problem: RH Wheelchair Mobility Goal: LTG Patient will propel w/c in home environment (PT) Description: LTG: Patient will propel wheelchair in home environment, # of feet with assistance (PT). Flowsheets (Taken 03/02/2019 1146) LTG: Pt will propel w/c in home environ  assist needed:: (downgraded 8/13 -AAT) Supervision/Verbal cueing Note: Downgraded 8/13 - AAT

## 2019-03-02 NOTE — Progress Notes (Signed)
Occupational Therapy Session Note  Patient Details  Name: Morry Veiga MRN: [id number] Date of Birth: 06-01-1971  Today's Date: 03/02/2019 OT Individual Time: 1130-1200 OT Individual Time Calculation (min): 30 min    Short Term Goals: Week 1:  OT Short Term Goal 1 (Week 1): Pt will tolerate sitting EOB wiht MIN A for sitting balance for 10 min during functional activity OT Short Term Goal 1 - Progress (Week 1): Progressing toward goal OT Short Term Goal 2 (Week 1): Pt will transfer to [facility] with MOD A of 1 caregiver to decrease caregiver burden OT Short Term Goal 2 - Progress (Week 1): Progressing toward goal OT Short Term Goal 3 (Week 1): Pt will don sock with AE PRN and min VC for technique OT Short Term Goal 3 - Progress (Week 1): Progressing toward goal OT Short Term Goal 4 (Week 1): Pt will bathe with MIN A OT Short Term Goal 4 - Progress (Week 1): Progressing toward goal  Skilled Therapeutic Interventions/Progress Updates:    1:1. Pt received in bed agreeable to supine therex. Ed on prioritizing LE therex v UB therex to work muscles to move fluid out of LEs. Pt completes 2x15 hip flex/ext, ab/adduct and knee flex/ext with manual resistance applied to each motion. Pt with prolonged rest breaks d/t verbosity with VC for redirection to task. Exited session with pt seated in w/c, call light in reach and all needs met  Therapy Documentation Precautions:  Precautions Precautions: Fall, Other (comment) Precaution Comments: L BKA with prosthesis; significant scrotal edema Restrictions Weight Bearing Restrictions: No General: General OT Amount of Missed Time: 17 Minutes Vital Signs:  Pain: Pain Assessment Pain Scale: 0-10 Pain Score: 5  Pain Type: Acute pain Pain Location: Leg Pain Orientation: Right Pain Descriptors / Indicators: Aching Pain Frequency: Intermittent Pain Onset: Gradual Patients Stated Pain Goal: 0 Pain Intervention(s): Medication (See  eMAR);Repositioned Multiple Pain Sites: No ADL: ADL Eating: Independent Grooming: Setup Upper Body Bathing: Minimal assistance Where Assessed-Upper Body Bathing: Bed level Lower Body Bathing: Maximal assistance Where Assessed-Lower Body Bathing: Bed level Upper Body Dressing: Moderate assistance Where Assessed-Upper Body Dressing: Bed level Toileting: Not assessed Toilet Transfer: Not assessed Vision   Perception    Praxis   Exercises:   Other Treatments:     Therapy/Group: Individual Therapy  Tonny Branch 03/02/2019, 12:05 PM

## 2019-03-02 NOTE — Progress Notes (Signed)
Patient placed on BIPAP for night time rest.  Tolerating well at this time.

## 2019-03-02 NOTE — Progress Notes (Signed)
Physical Therapy Weekly Progress Note  Patient Details  Name: Eliot Popper MRN: [id number] Date of Birth: 1971/02/09  Beginning of progress report period: February 23, 2019 End of progress report period: March 02, 2019  Today's Date: 03/02/2019 PT Individual Time: 0940-1050 PT Individual Time Calculation (min): 70 min   Patient has met 1 of 7 long term goals. Pt continues to make slow and steady progress towards LTGs. Pt remains limited by increased scrotal and abdominal edema, although it has improved since last reporting period. He is consistently performing bed mobility w/ min assist and, when edema and pain are management, performing stand pivot transfers w/ RW and CGA-min assist. When edema and pain are not management, he cannot tolerate any OOB activity. Goals of therapy remain to increase tolerance to upright and sitting in w/c for transport to/from dialysis on outpatient basis. He tolerated sitting in w/c for 20 min safely today, goal would be to tolerate ~1 hour.   Patient continues to demonstrate the following deficits muscle weakness and muscle joint tightness, decreased cardiorespiratoy endurance and decreased sitting balance, decreased postural control and decreased balance strategies and therefore will continue to benefit from skilled PT intervention to increase functional independence with mobility and to increase tolerance to OOB activity.   Patient progressing toward long term goals..  Plan of care revisions: LTGs updated to supervision overall, w/c level.  PT Short Term Goals Week 3:  PT Short Term Goal 1 (Week 3): =LTGs due to ELOS Week 4:  PT Short Term Goal 1 (Week 4): =LTGs due to ELOS  Skilled Therapeutic Interventions/Progress Updates:   Pt in supine and immediately getting upset at this therapist for "leaving him" up in the recliner after yesterday's session. Had prolonged discussion regarding events of yesterday's session and pt's agreement to sit up in  recliner until dialysis. Pt verbalized understanding, however is very tangential bout how he has been wronged and "used as a piece of meat". Requires ongoing education regarding benefits of OOB activity, edema management including compression on scrotum, and intentions/motivations of all hospital staff trying to ultimately help him and make him better. Pt eventually agreeable to work with this therapist and willing to attempt OOB activity. Supine>sit w/ min assist and maintain static sitting balance w/ min assist while therapist donned LLE prosthesis. Sit>stand from elevated bed surface w/ CGA and stand pivot transfer to w/c w/ min assist x2 for safety. Pt taking steps and managing RW w/ min assist this session. Pt stated he felt better overall sitting in w/c this week vs last week (although scrotal pain continues), pt also noticeably more comfortable, sitting more upright, and BLEs less confined 2/2 decreased swelling. Pt self-propelled w/c in hallway w/ BUEs, 20-40' x5 in total w/ rest breaks 2/2 fatigue and moderate increase in work of breathing. Tolerated ~20 min in total of sitting safely in w/c. Returned to room total assist and performed stand pivot transfer back to EOB w/ CGA. Needed min assist to boost into standing with pt pulling up on therapist's hands.  Ended session in supine, all needs in reach. Dicussed w/ pt getting measurements for doorways at home to make sure a w/c would fit, should he go home.   Therapy Documentation Precautions:  Precautions Precautions: Fall, Other (comment) Precaution Comments: L BKA with prosthesis; significant scrotal edema Restrictions Weight Bearing Restrictions: No Pain: Pain Assessment Pain Scale: 0-10 Pain Score: 5  Pain Type: Acute pain Pain Location: Leg Pain Orientation: Right Pain Descriptors / Indicators: Aching Pain  Frequency: Intermittent Pain Onset: Gradual Patients Stated Pain Goal: 0 Pain Intervention(s): Medication (See  eMAR);Repositioned Multiple Pain Sites: No  Therapy/Group: Individual Therapy  Micca Matura K Donnald Tabar 03/02/2019, 11:11 AM

## 2019-03-02 NOTE — Progress Notes (Signed)
Social Work [name], male   DOB: 07-27-1970, 48 y.o.   MRN: [id number]   Have reviewed team conference with pt and discussed PT report that morning session/ transfer went much better today with overall decreased swelling.  Pt very hopeful the HD MD will continue to do daily HD treatments and that his scrotal swelling will continue to improve.  "I'm trying very hard."  Will monitor progress.  Continue to follow.  Derin Granquist, LCSW

## 2019-03-02 NOTE — Progress Notes (Signed)
[facility] PHYSICAL MEDICINE & REHABILITATION PROGRESS NOTE   Subjective/Complaints: Happy that he emptied his bladder with bm early this morning. However was scanned for 469 at 0845  ROS: Patient denies fever, rash, sore throat, blurred vision, nausea, vomiting, diarrhea, cough, shortness of breath or chest pain, joint or back pain, headache, or mood change.     Objective:   No results found. Recent Labs    02/28/19 0556 03/01/19 0953  WBC 11.7* 10.2  HGB 8.0* 8.5*  HCT 25.5* 27.5*  PLT 319 302   Recent Labs    02/27/19 1528 03/01/19 0953  NA 126* 130*  K 4.5 3.7  CL 90* 94*  CO2 24 24  GLUCOSE 139* 158*  BUN 42* 23*  CREATININE 6.42* 4.49*  CALCIUM 8.0* 8.1*    Intake/Output Summary (Last 24 hours) at 03/02/2019 1105 Last data filed at 03/02/2019 1000 Gross per 24 hour  Intake 600 ml  Output 3500 ml  Net -2900 ml     Physical Exam: Vital Signs Blood pressure (!) 159/86, pulse 92, temperature 98.5 F (36.9 C), temperature source Oral, resp. rate 17, height 5\' 10"  (1.778 m), weight 128.7 kg, SpO2 99 %. Constitutional: No distress . Vital signs reviewed. HENT: Normocephalic.  Atraumatic. Eyes: EOMI. No discharge. Cardiovascular: No JVD. RRR Respiratory: Normal effort.  No stridor. Clear to auscultation.  GI: Distended. BS+ Skin: RLE dressing c/d/i. Left BKA healed. Psych: Normal mood.  Normal behavior. Musc: Scrotal edema. Psych: Easily agitated Neurological: Alert. Motor:  RLE: 4-4+/5 proximal to distal, stable Left lower extremity: Hip flexion, knee extension 4+/5, stable  Assessment/Plan: 1. Functional deficits secondary to debility which require 3+ hours per day of interdisciplinary therapy in a comprehensive inpatient rehab setting.  Physiatrist is providing close team supervision and 24 hour management of active medical problems listed below.  Physiatrist and rehab team continue to assess barriers to discharge/monitor patient progress toward  functional and medical goals  Care Tool:  Bathing    Body parts bathed by patient: Right arm, Left arm, Chest, Abdomen, Face   Body parts bathed by helper: Front perineal area, Buttocks, Right upper leg, Left upper leg, Right lower leg Body parts n/a: Left lower leg   Bathing assist Assist Level: Maximal Assistance - Patient 24 - 49%     Upper Body Dressing/Undressing Upper body dressing Upper body dressing/undressing activity did not occur (including orthotics): N/A(patient already had a gown) What is the patient wearing?: Hospital gown only    Upper body assist Assist Level: Total Assistance - Patient < 25%    Lower Body Dressing/Undressing Lower body dressing    Lower body dressing activity did not occur: Refused What is the patient wearing?: Hospital gown only     Lower body assist Assist for lower body dressing: Total Assistance - Patient < 25%     Toileting Toileting Toileting Activity did not occur Landscape architect and hygiene only): Safety/medical concerns  Toileting assist Assist for toileting: Dependent - Patient 0%     Transfers Chair/bed transfer  Transfers assist  Chair/bed transfer activity did not occur: Safety/medical concerns  Chair/bed transfer assist level: Dependent - mechanical lift     Locomotion Ambulation   Ambulation assist   Ambulation activity did not occur: Safety/medical concerns(unable due to pain and scrotal and bladder issues)          Walk 10 feet activity   Assist  Walk 10 feet activity did not occur: Safety/medical concerns(unable due to pain and scrotal and bladder  issues)        Walk 50 feet activity   Assist Walk 50 feet with 2 turns activity did not occur: Safety/medical concerns(unable due to pain and scrotal and bladder issues)         Walk 150 feet activity   Assist Walk 150 feet activity did not occur: Safety/medical concerns(unable due to pain and scrotal and bladder issues)          Walk 10 feet on uneven surface  activity   Assist Walk 10 feet on uneven surfaces activity did not occur: Safety/medical concerns(unable due to pain and scrotal and bladder issues)         Wheelchair     Assist   Type of Wheelchair: Manual Wheelchair activity did not occur: Safety/medical concerns(unable due to pain and scrotal and bladder issues)  Wheelchair assist level: Supervision/Verbal cueing Max wheelchair distance: 25'    Wheelchair 50 feet with 2 turns activity    Assist    Wheelchair 50 feet with 2 turns activity did not occur: Safety/medical concerns(unable due to pain and scrotal and bladder issues)       Wheelchair 150 feet activity     Assist Wheelchair 150 feet activity did not occur: Safety/medical concerns(unable due to pain and scrotal and bladder issues)        Medical Problem List and Plan: 1.Debilitysecondary to end-stage renal disease/multi-medical. Hemodialysis recently initiated.  Cont CIR therapies  -dispo pending 2. Antithrombotics: -DVT/anticoagulation:Subcutaneous heparin -antiplatelet therapy: N/A 3. Pain Management:Neurontin 300 mg nightly, Ultram as needed  Stable 8/12 4. Mood:Celexa 40 mg daily -antipsychotic agents: N/A  -team providing support 5. Neuropsych: This patientiscapable of making decisions on hisown behalf.  Discussed with neuropsychology-appreciate recs, no SI 6. Skin/Wound Care:Scrotal sling Local care to bullae, foam dressings--assume these are related to chronic edema  Appreciate Vasc recs - nonvascular RLE wounds  Appreciate further WOC recs  Surgery performed bx: "necrotic tissue with acute inflammation" - defer to surgery recs 7. Fluids/Electrolytes/Nutrition:Routine in and outs 8.  ESRD:. Status post AV fistula 01/09/2019. Hemodialysis directed. -continue to diurese per nephro Filed Weights   03/01/19 0500 03/01/19 1350 03/01/19 1728   Weight: 130.4 kg 131.8 kg 128.7 kg   ?weights inconsistent 9. Diabetes mellitus. Recent hemoglobin A1c 5.0.   Controlled on 8/13  Continue to monitor 10. Anemia of chronic disease. Continue Aranesp  Hemoglobin 8.0 on 8/11, recs per nephro, labs with HD 11. History of left BKA 5 years ago in [place].   12. Hypertension. Norvasc 10 mg daily.    Labile on 8/12 13. Morbid obesity. BMI 43.78. Dietary follow-up 14. Hyperlipidemia. Lipitor 15. History of syphilis. Treated outpatient with penicillin to the healthcare department recently. Outpatient follow-up 16. Urine retention:   Foley out. Voided some overnight. Still with 500cc on scan today  Urology following for now. May not need cysto,cath  continues on keflex for bladder proph  flomax to help with emptying, urecholine 25mg  tid, increased yesterday  continue lidocaine gel for urethral meatus 17.  Sleep disturbance  Melatonin 4.5 mg nightly   Cont CPAP 18.  Leukocytosis  WBCs 10.2  Afebrile  Continue to monitor 19.  Constipation  Bowel meds increased on 7/28, decreased on 7/31, decreased on 8/5  Had bm today, mushy 20.  Pruritus--generally improved  Local care, air to back  Benadryl   Add sarna and eucerin  LOS: 22 days A FACE TO [place] 03/02/2019, 11:05 AM

## 2019-03-02 NOTE — Progress Notes (Signed)
[name], male   DOB: [birth year], 48 y.o.   MRN: [id number] S: Feels better today and able to sit up for a brief time.  O:BP (!) 159/86 (BP Location: Right Arm)   Pulse 92   Temp 98.5 F (36.9 C) (Oral)   Resp 17   Ht 5\' 10"  (1.778 m)   Wt 128.7 kg   SpO2 99%   BMI 40.71 kg/m   Intake/Output Summary (Last 24 hours) at 03/02/2019 1202 Last data filed at 03/02/2019 1000 Gross per 24 hour  Intake 600 ml  Output 3500 ml  Net -2900 ml   Intake/Output: I/O last 3 completed shifts: In: 840 [P.O.:840] Out: 3500 [Other:3500]  Intake/Output this shift:  Total I/O In: 120 [P.O.:120] Out: -  Weight change: -0.3 kg Gen: NAD CVS: no rub Resp: cta Abd: +BS, soft, NT, + abd wall edema Ext: s/p L BKA, right leg dressing in place, 2+ presacral edema, trace edema of lower extremities  Recent Labs  Lab 02/23/19 1338 02/25/19 1157 02/27/19 1528 03/01/19 0953  NA 125* 126* 126* 130*  K 4.6 4.3 4.5 3.7  CL 90* 91* 90* 94*  CO2 24 23 24 24   GLUCOSE 133* 135* 139* 158*  BUN 36* 40* 42* 23*  CREATININE 6.18* 6.13* 6.42* 4.49*  ALBUMIN 1.6* 1.6* 1.6* 1.8*  CALCIUM 7.9* 7.9* 8.0* 8.1*  PHOS 3.6 3.9 4.3 3.4   Liver Function Tests: Recent Labs  Lab 02/25/19 1157 02/27/19 1528 03/01/19 0953  ALBUMIN 1.6* 1.6* 1.8*   No results for input(s): LIPASE, AMYLASE in the last 168 hours. No results for input(s): AMMONIA in the last 168 hours. CBC: Recent Labs  Lab 02/23/19 1338 02/25/19 1157 02/27/19 1529 02/28/19 0556 03/01/19 0953  WBC 13.7* 13.3* 13.0* 11.7* 10.2  HGB 7.5* 7.6* 7.6* 8.0* 8.5*  HCT 24.7* 24.8* 24.5* 25.5* 27.5*  MCV 95.4 93.2 93.5 91.7 93.5  PLT 402* 380 323 319 302   Cardiac Enzymes: No results for input(s): CKTOTAL, CKMB, CKMBINDEX, TROPONINI in the last 168 hours. CBG: No results for input(s): GLUCAP in the last 168 hours.  Iron Studies: No results for input(s): IRON, TIBC, TRANSFERRIN, FERRITIN in the last 72  hours. Studies/Results: No results found. . sodium chloride   Intravenous Once  . atorvastatin  40 mg Oral Daily  . bethanechol  25 mg Oral TID  . calcitRIOL  0.25 mcg Oral Daily  . calcium carbonate  1 tablet Oral BID WC  . cephALEXin  250 mg Oral Q12H  . Chlorhexidine Gluconate Cloth  6 each Topical Q0600  . citalopram  40 mg Oral Daily  . collagenase   Topical Daily  . darbepoetin (ARANESP) injection - DIALYSIS  150 mcg Intravenous Q Tue-HD  . gabapentin  300 mg Oral QHS  . heparin  5,000 Units Subcutaneous Q8H  . hydrocerin   Topical BID  . lidocaine  20 mL Other Once  . lidocaine   Topical 5 X Daily  . Melatonin  4.5 mg Oral QHS  . pantoprazole  40 mg Oral Daily  . polyethylene glycol  17 g Oral Daily  . tamsulosin  0.4 mg Oral QPC breakfast    BMET    Component Value Date/Time   NA 130 (L) 03/01/2019 0953   K 3.7 03/01/2019 0953   CL 94 (L) 03/01/2019 0953   CO2 24 03/01/2019 0953   GLUCOSE 158 (H) 03/01/2019 0953   BUN 23 (H) 03/01/2019 0953   CREATININE 4.49 (H)  03/01/2019 0953   CALCIUM 8.1 (L) 03/01/2019 0953   GFRNONAA 15 (L) 03/01/2019 0953   GFRAA 17 (L) 03/01/2019 0953   CBC    Component Value Date/Time   WBC 10.2 03/01/2019 0953   RBC 2.94 (L) 03/01/2019 0953   HGB 8.5 (L) 03/01/2019 0953   HCT 27.5 (L) 03/01/2019 0953   PLT 302 03/01/2019 0953   MCV 93.5 03/01/2019 0953   MCH 28.9 03/01/2019 0953   MCHC 30.9 03/01/2019 0953   RDW 13.7 03/01/2019 0953   LYMPHSABS 1.4 02/09/2019 1500   MONOABS 1.6 (H) 02/09/2019 1500   EOSABS 0.8 (H) 02/09/2019 1500   BASOSABS 0.1 02/09/2019 1500     Assessment/Plan:  1. ESRD- Initiated HD on 01/31/19 and has been tolerating HD well and will eventually go to [facility] after discharge on a TTS schedule. 1. Given anasarca will continue with dailyHD for volume management  2. Will get back on TTS schedule when swelling has improved for him to sit up in a wheelchair/recliner 2. Volume overload-  has significant idwg and scrotaland abdominal edema. Question bed scale and will need to weigh him standing if possible or on hoyer lift. 1. Will plan for daily HD to help with volume management but I did stress the need for fluid restriction 2. He needs to be able to sit up in a wheelchair for outpatient dialysis and he reports that his scrotal edema prevents this, hence aggressive HD/UF. 3. 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 Urology has been consulted and plan for removal of foley catheter today. If he fails trial off of cath will likely require operative procedure. Will plan for daily HD to help improve edema 1. Will stop IV lasix as his UOP has declined since starting on dialysis. 4. Anemia of ESRD- h/h stable. On ESA 5. SHPTH- on calcitriol 6. Latent syphilis- per primary svc 7. 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 Wound care consulted. 8. Deconditioning- cont with PT/OT 9. Disposition- pending ability to sit in a chair for prolonged periods of time to facilitate transfer to and from HD from SNF.  Donetta Potts, MD [employer] [phone]

## 2019-03-02 NOTE — Progress Notes (Signed)
Occupational Therapy Session Note  Patient Details  Name: Michael Riggs MRN: [id number] Date of Birth: 06/28/1971  Today's Date: 03/02/2019 OT Individual Time: [id number] OT Individual Time Calculation (min): 28 min  and Today's Date: 03/02/2019 OT Missed Time: 17 Minutes Missed Time Reason: Other (comment)(Pt on the phone, then Urologist in with patient)  Short Term Goals: Week 3:  OT Short Term Goal 1 (Week 3): Pt will tolerate sitting in wc or recliner for 1 hour in between therapy sessions OT Short Term Goal 2 (Week 3): Pt will complete transfer to commode with LRAD and Mod A of 1 caregiver OT Short Term Goal 3 (Week 3): Pt will tolerate leaning forward at the sink to shave OT Short Term Goal 4 (Week 3): Pt will tolerate wearing scrotal sling during transfer.  Skilled Therapeutic Interventions/Progress Updates:    Pt on the phone when OT arrived, requesting to finish conversation with his mother, the Urology in with pt. OT fashioned pt compression for scrotum using stockinette. Pt's scrotal edema was much better today and OT was able to place compression for continued edema management. Pt completed UB there-ex 3 sets of 20 arm raises. Discussed getting OOB with PT at 10:00, then returned to bed with next OT session at 11:30. Pt agreeable. Pt tolerated HOB raised to 30 degrees. Pt left with nurse present administering medications.   Therapy Documentation Precautions:  Precautions Precautions: Fall, Other (comment) Precaution Comments: L BKA with prosthesis; significant scrotal edema Restrictions Weight Bearing Restrictions: No General: General OT Amount of Missed Time: 17 Minutes Pain: Pain Assessment Pain Scale: 0-10 Pain Score: 10-Worst pain ever Pain Type: Acute pain Pain Location: Scrotum Pain Descriptors / Indicators: Constant;Burning;Discomfort Pain Frequency: Constant Pain Onset: On-going Patients Stated Pain Goal: 1 Pain Intervention(s):  Repositioned  Therapy/Group: Individual Therapy  Valma Cava 03/02/2019, 9:23 AM

## 2019-03-02 NOTE — Progress Notes (Signed)
Asked patient if he planned to wear CPAP tonight.  He stated that he was planning on wearing it, but did not want it right now, stated closer to midnight.  I asked him to have his RN call when he was ready to go on.  He stated that he would and was waiting on medications from his RN.  No distress noted, will continue to monitor.

## 2019-03-02 NOTE — Progress Notes (Signed)
  Subjective: Patient reports having a saturated diaper early this am w/ BM. Amt of urine unmeasured. He does not feel urge at all now.  Objective: Vital signs in last 24 hours: Temp:  [98 F (36.7 C)-98.8 F (37.1 C)] 98.5 F (36.9 C) (08/13 0413) Pulse Rate:  [80-98] 92 (08/13 0413) Resp:  [16-19] 17 (08/13 0413) BP: (113-159)/(69-102) 159/86 (08/13 0413) SpO2:  [96 %-99 %] 99 % (08/13 0413) Weight:  [128.7 kg-131.8 kg] 128.7 kg (08/12 1728)  Intake/Output from previous day: 08/12 0701 - 08/13 0700 In: 600 [P.O.:600] Out: 3500  Intake/Output this shift: No intake/output data recorded.    Lab Results: Recent Labs    02/27/19 1529 02/28/19 0556 03/01/19 0953  HGB 7.6* 8.0* 8.5*  HCT 24.5* 25.5* 27.5*   BMET Recent Labs    02/27/19 1528 03/01/19 0953  NA 126* 130*  K 4.5 3.7  CL 90* 94*  CO2 24 24  GLUCOSE 139* 158*  BUN 42* 23*  CREATININE 6.42* 4.49*  CALCIUM 8.0* 8.1*   No results for input(s): LABPT, INR in the last 72 hours. No results for input(s): LABURIN in the last 72 hours. Results for orders placed or performed during the hospital encounter of 02/08/19  Urine Culture     Status: Abnormal   Collection Time: 02/13/19  1:25 PM   Specimen: Urine, Random  Result Value Ref Range Status   Specimen Description URINE, RANDOM  Final   Special Requests NONE  Final   Culture (A)  Final    <10,000 COLONIES/mL INSIGNIFICANT GROWTH Performed at [facility], [place] [address] [place], [place] [address]    Report Status 02/14/2019 FINAL  Final    Studies/Results: No results found.  Assessment/Plan:   Some urine out overnight? Volume. May be from bethanechol.  Will scan bladder q 12 hrs.  If repeat cath needed consider I/o cath following premed w/ valium, opioid and topical agent. Spent a long time reassuring him about this--may not need though   LOS: 22 days   Jorja Loa 03/02/2019, 7:59 AM

## 2019-03-03 ENCOUNTER — Inpatient Hospital Stay (HOSPITAL_COMMUNITY): Payer: Medicare Other | Admitting: Occupational Therapy

## 2019-03-03 ENCOUNTER — Inpatient Hospital Stay (HOSPITAL_COMMUNITY): Payer: Medicare Other | Admitting: Physical Therapy

## 2019-03-03 LAB — GLUCOSE, CAPILLARY: Glucose-Capillary: 135 mg/dL — ABNORMAL HIGH (ref 70–99)

## 2019-03-03 LAB — HEPATITIS B SURFACE ANTIGEN: Hepatitis B Surface Ag: NEGATIVE

## 2019-03-03 MED ORDER — ALTEPLASE 2 MG IJ SOLR
2.0000 mg | Freq: Once | INTRAMUSCULAR | Status: DC | PRN
  Filled 2019-03-03: qty 2

## 2019-03-03 MED ORDER — HEPARIN SODIUM (PORCINE) 1000 UNIT/ML DIALYSIS
1000.0000 [IU] | INTRAMUSCULAR | Status: DC | PRN
  Administered 2019-03-03: 4000 [IU] via INTRAVENOUS_CENTRAL
  Filled 2019-03-03 (×2): qty 1

## 2019-03-03 MED ORDER — HEPARIN SODIUM (PORCINE) 1000 UNIT/ML DIALYSIS
1000.0000 [IU] | INTRAMUSCULAR | Status: DC | PRN
  Filled 2019-03-03: qty 1

## 2019-03-03 MED ORDER — ACETAMINOPHEN 325 MG PO TABS
ORAL_TABLET | ORAL | Status: AC
  Filled 2019-03-03: qty 2

## 2019-03-03 MED ORDER — SODIUM CHLORIDE 0.9 % IV SOLN: 100.0000 mL | INTRAVENOUS | Status: DC | PRN

## 2019-03-03 NOTE — Progress Notes (Signed)
[facility]    NEPHROLOGY PROGRESS NOTE  SUBJECTIVE: Patient seen and examined.  Working with physical therapy to get out of bed.  Denies any chest pain, shortness of breath, nausea, vomiting, diarrhea or dysuria.  No other specific complaints noted.  Continues to have significant scrotal and sacral edema limiting movement.  For dialysis again today.    OBJECTIVE:  Vitals:   03/03/19 0034 03/03/19 0616  BP:  (!) 146/85  Pulse: 83 88  Resp: 18 18  Temp:  98 F (36.7 C)  SpO2: 98% 100%    Intake/Output Summary (Last 24 hours) at 03/03/2019 1215 Last data filed at 03/03/2019 0810 Gross per 24 hour  Intake 360 ml  Output 4000 ml  Net -3640 ml      General:  AAOx3 NAD HEENT: MMM [place] AT anicteric sclera Neck:  No JVD, no adenopathy CV:  Heart RRR  Lungs:  L/S CTA bilaterally Abd:  abd SNT/ND with normal BS, obese GU:  Bladder non-palpable, positive scrotal edema Extremities: Left BKA, trace bilateral edema, right lower extremity wound bandaged Skin:  No skin rash  MEDICATIONS:  . sodium chloride   Intravenous Once  . atorvastatin  40 mg Oral Daily  . bethanechol  25 mg Oral TID  . calcitRIOL  0.25 mcg Oral Daily  . calcium carbonate  1 tablet Oral BID WC  . cephALEXin  250 mg Oral Q12H  . Chlorhexidine Gluconate Cloth  6 each Topical Q0600  . citalopram  40 mg Oral Daily  . collagenase   Topical Daily  . darbepoetin (ARANESP) injection - DIALYSIS  150 mcg Intravenous Q Tue-HD  . gabapentin  300 mg Oral QHS  . heparin  5,000 Units Subcutaneous Q8H  . hydrocerin   Topical BID  . lidocaine  20 mL Other Once  . lidocaine   Topical 5 X Daily  . Melatonin  4.5 mg Oral QHS  . pantoprazole  40 mg Oral Daily  . polyethylene glycol  17 g Oral Daily  . tamsulosin  0.4 mg Oral QPC breakfast       LABS:   CBC Latest Ref Rng & Units 03/02/2019 03/01/2019 02/28/2019  WBC 4.0 - 10.5 K/uL 12.6(H) 10.2 11.7(H)  Hemoglobin 13.0 - 17.0 g/dL 7.6(L) 8.5(L) 8.0(L)   Hematocrit 39.0 - 52.0 % 25.6(L) 27.5(L) 25.5(L)  Platelets 150 - 400 K/uL 324 302 319    CMP Latest Ref Rng & Units 03/02/2019 03/01/2019 02/27/2019  Glucose 70 - 99 mg/dL 180(H) 158(H) 139(H)  BUN 6 - 20 mg/dL 23(H) 23(H) 42(H)  Creatinine 0.61 - 1.24 mg/dL 4.28(H) 4.49(H) 6.42(H)  Sodium 135 - 145 mmol/L 129(L) 130(L) 126(L)  Potassium 3.5 - 5.1 mmol/L 4.0 3.7 4.5  Chloride 98 - 111 mmol/L 93(L) 94(L) 90(L)  CO2 22 - 32 mmol/L 24 24 24   Calcium 8.9 - 10.3 mg/dL 7.9(L) 8.1(L) 8.0(L)  Total Protein 6.5 - 8.1 g/dL - - -  Total Bilirubin 0.3 - 1.2 mg/dL - - -  Alkaline Phos 38 - 126 U/L - - -  AST 15 - 41 U/L - - -  ALT 0 - 44 U/L - - -    Lab Results  Component Value Date   PTH 144 (H) 02/03/2019   CALCIUM 7.9 (L) 03/02/2019   CAION 1.01 (L) 01/30/2019   PHOS 2.8 03/02/2019       Component Value Date/Time   COLORURINE AMBER (A) 02/13/2019 1325   APPEARANCEUR CLOUDY (A) 02/13/2019 1325   LABSPEC  1.022 02/13/2019 1325   PHURINE 5.0 02/13/2019 1325   GLUCOSEU 150 (A) 02/13/2019 1325   HGBUR LARGE (A) 02/13/2019 1325   BILIRUBINUR NEGATIVE 02/13/2019 1325   KETONESUR NEGATIVE 02/13/2019 1325   PROTEINUR >=300 (A) 02/13/2019 1325   NITRITE NEGATIVE 02/13/2019 1325   LEUKOCYTESUR TRACE (A) 02/13/2019 1325      Component Value Date/Time   PHART 7.221 (L) 01/31/2019 1115   PCO2ART 34.9 01/31/2019 1115   PO2ART 80.0 (L) 01/31/2019 1115   HCO3 13.8 (L) 01/31/2019 1115   TCO2 12 (L) 01/30/2019 2339   ACIDBASEDEF 12.4 (H) 01/31/2019 1115   O2SAT 93.8 01/31/2019 1115       Component Value Date/Time   IRON 65 01/31/2019 0248   TIBC 119 (L) 01/31/2019 0248   FERRITIN 284 01/31/2019 0248   IRONPCTSAT 55 (H) 01/31/2019 0248       ASSESSMENT/PLAN:    1. ESRD- Initiated HD on 01/31/19 and has been tolerating HD well and will eventually go to [facility] after discharge on a TTS schedule. 1. Given anasarca will continue with dailyHD for volume management  2. Will  get back on TTS schedule when swelling has improved for him to sit up in a wheelchair/recliner 2. Volume overload- has significant idwg and scrotaland abdominal edema. Question bed scale and will need to weigh him standing if possible or on hoyer lift. 1. Will plan for daily HD to help with volume management but continue to reinforce the need for fluid restriction 2. He needs to be able to sit up in a wheelchair for outpatient dialysis and he reports that his scrotal edema prevents this, hence aggressive HD/UF. 3. 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 Urology has been consulted and plan for removal of foley catheter today. If he fails trial off of cath will likely require operative procedure. Will plan for daily HD to help improve edema 4. Anemia of ESRD- h/h stable. On ESA.  Recheck iron studies 5. SHPTH- on calcitriol.  Recheck PTH.  Phos stable 6. Latent syphilis- per primary svc 7. 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 Wound care consulted. 8. Deconditioning- cont with PT/OT 9. Disposition- pending ability to sit in a chair for prolonged periods of time to facilitate transfer to and from HD from SNF.    [place], DO, [place]

## 2019-03-03 NOTE — Procedures (Signed)
I evaluated the patient while on hemodialysis.  I reviewed the patient's treatment plan.  Patient tolerating dialysis well.  Discussed with RN.  

## 2019-03-03 NOTE — Progress Notes (Signed)
Pt called for pain medication and to clean bowel movement, and  for CPAP.Marland Kitchen  Pain medication administered, Peri care completed. Respiratory called for CPAP. Pt in bed, fowlers position watching computer. Call bell within reach. Denies further needs.

## 2019-03-03 NOTE — Consult Note (Signed)
Asked to review documentation on possible device related pressure injury to the penis.  I have seen this patient in consultation also for his LE wounds.  Feel based on urology notes and multiple FC placement this a wound related to trauma rather than constant pressure from a medical device. 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   [place], [place], [place]

## 2019-03-03 NOTE — Progress Notes (Signed)
Occupational Therapy Weekly Progress Note  Patient Details  Name: Michael Riggs MRN: [id number] Date of Birth: 1971/03/16  Beginning of progress report period: February 09, 2019 End of progress report period: March 03, 2019  Patient has met 3 of 3 short term goals.  Pt has made good progress towards OT goals this week. Pt has been getting OOB to wc more consistently and has tolerated sitting up in wc for about 1 hour in between therapies. Pt's scrotal edema has also decreased and he has been able to even wear pants. Still working on upright tolerance for pt to be able to tolerate outpatient dialysis. Continue current POC.  Patient continues to demonstrate the following deficits: muscle weakness and decreased sitting balance, decreased standing balance and difficulty tolerating upright position 2/2 scrotal edema and therefore will continue to benefit from skilled OT intervention to enhance overall performance with BADL and Reduce care partner burden.  Patient progressing toward long term goals..  Continue plan of care.  OT Short Term Goals Week 3:  OT Short Term Goal 1 (Week 3): Pt will tolerate sitting in wc or recliner for 1 hour in between therapy sessions OT Short Term Goal 2 (Week 3): Pt will complete transfer to commode with LRAD and Mod A of 1 caregiver OT Short Term Goal 2 - Progress (Week 3): Met OT Short Term Goal 3 (Week 3): Pt will tolerate leaning forward at the sink to shave OT Short Term Goal 3 - Progress (Week 3): Met OT Short Term Goal 4 (Week 3): Pt will tolerate wearing scrotal sling during transfer. OT Short Term Goal 4 - Progress (Week 3): Met Week 4:  OT Short Term Goal 1 (Week 4): Pt will tolerate wearing scrotal edema stockinette for 5 hours during the day OT Short Term Goal 2 (Week 4): Pt will complete grooming tasks from wc at the sink with set-up A OT Short Term Goal 3 (Week 4): Pt will tolerate sitting EOB for 2 mins in preparation for BADL tasks  Therapy  Documentation Precautions:  Precautions Precautions: Fall, Other (comment) Precaution Comments: L BKA with prosthesis; significant scrotal edema Restrictions Weight Bearing Restrictions: No Pain: Pain Assessment Pain Scale: 0-10 Pain Score: 6  Pain Type: Acute pain Pain Location: Leg Pain Orientation: Right Pain Descriptors / Indicators: Aching Pain Frequency: Intermittent Pain Onset: On-going Patients Stated Pain Goal: 0 Pain Intervention(s):Repositioned Multiple Pain Sites: No   Therapy/Group: Individual Therapy  Valma Cava 03/03/2019, 12:42 PM

## 2019-03-03 NOTE — Progress Notes (Signed)
Upon entering room at shift change with   Maudry Mayhew, RN to greet Mr. Michael Riggs, patient responded with inappropriate comment. Educated patient on appropriate language to use toward staff and inappropriate behavior and comments would not be tolerated.   During medication pass, entered room with NT's, patient expressed that he was not allowed to joke, and talk. Educated patient on patient/staff interaction,  to notify staff of all needs, and concerns without hesitation. Patient continued to notify LPN that he would not speak to her and that nurse was not able to take a joke. Would not cooperate with med pass. Pt was asked if he felt that he needed a different nurse. Pt responds that he is not doing anything and that myself and other females on the staff could not take jokes and would not let him "cuss".   LPN left room to speak to charge nurse. Suggested to charge nurse that a NT would be asked to accompany into room.   Patient care continued with patient no longer making lewd, inappropriate remarks.

## 2019-03-03 NOTE — Progress Notes (Signed)
Occupational Therapy Session Note  Patient Details  Name: Michael Riggs MRN: [id number] Date of Birth: Oct 24, 1970  Today's Date: 03/03/2019  Session 1 OT Individual Time: 3875-6433 OT Individual Time Calculation (min): 38 min   Session 2 OT Individual Time: 2951-8841 OT Individual Time Calculation (min): 40 min   Short Term Goals: Week 3:  OT Short Term Goal 1 (Week 3): Pt will tolerate sitting in wc or recliner for 1 hour in between therapy sessions OT Short Term Goal 2 (Week 3): Pt will complete transfer to commode with LRAD and Mod A of 1 caregiver OT Short Term Goal 3 (Week 3): Pt will tolerate leaning forward at the sink to shave OT Short Term Goal 4 (Week 3): Pt will tolerate wearing scrotal sling during transfer.  Skilled Therapeutic Interventions/Progress Updates:  Session 1   Pt greeted semi-reclined in bed, pt requesting to finish eating breakfast. OT discussed OT goals and POC while pt eating breakfast. Pt tolerated HOB at 50 degrees today. OT washed stockinette used for compression yesterday, and fashioned a second stockinette to be traded in and out as one is being washed. Compression applied to scrotum with scrotal edema greatly improved!. Pt agreeable to even put on shorts today. Pt needed OT assist to thread pant legs, then he was able to bridge and roll enough to pull pants up himself. Pt agreeable to get up at next session, then plan for OT to get him back to bed at 11:15 session. Pt left sitting in bed with HOB elevated to 52 degrees.   Session 2 Pt greeted sitting in wc and agreeable to OT treatment session. Pt tolerated being OOB in wc for ~1 hour in between therapies and ready to get back in the bed. Pt completed stand-pivot back to bed with min A And RW. Min A to stand w/ RW by pulling up and refused to try to push up from wc hand rests for more safe technique. OT had to hold RW steady when standing. Pt needed OT assist to doff L prosthesis, then returned to bed  with supervision. OT assist to doff pants, then pt reported feeling like he had had a BM. Pt rolled to the L with supervision and had been incontinent of BM. Total A for peri-care and brief change. OT re-adjusted scrotal stockinette for edema management. Pt given shampoo cap and washed hair semi-reclined in bed. Pt left semi-reclined in bed at end of session with needs met.  Therapy Documentation Precautions:  Precautions Precautions: Fall, Other (comment) Precaution Comments: L BKA with prosthesis; significant scrotal edema Restrictions Weight Bearing Restrictions: No Pain: Pain Assessment Pain Scale: 0-10 Pain Score: 6  Pain Type: Acute pain Pain Location: Leg Pain Orientation: Right Pain Descriptors / Indicators: Aching Pain Frequency: Intermittent Pain Onset: On-going Patients Stated Pain Goal: 0 Pain Intervention(s):Repositioned Multiple Pain Sites: No   Therapy/Group: Individual Therapy  Valma Cava 03/03/2019, 12:28 PM

## 2019-03-03 NOTE — Progress Notes (Signed)
[place] PHYSICAL MEDICINE & REHABILITATION PROGRESS NOTE   Subjective/Complaints: Moved better with therapy yesterday. Scrotum feels better. Apparently had urination with BM.   ROS: Patient denies fever, rash, sore throat, blurred vision, nausea, vomiting, diarrhea, cough, shortness of breath or chest pain, headache, or mood change.    Objective:   No results found. Recent Labs    03/01/19 0953 03/02/19 1400  WBC 10.2 12.6*  HGB 8.5* 7.6*  HCT 27.5* 25.6*  PLT 302 324   Recent Labs    03/01/19 0953 03/02/19 1400  NA 130* 129*  K 3.7 4.0  CL 94* 93*  CO2 24 24  GLUCOSE 158* 180*  BUN 23* 23*  CREATININE 4.49* 4.28*  CALCIUM 8.1* 7.9*    Intake/Output Summary (Last 24 hours) at 03/03/2019 0853 Last data filed at 03/02/2019 1737 Gross per 24 hour  Intake 240 ml  Output 4000 ml  Net -3760 ml     Physical Exam: Vital Signs Blood pressure (!) 146/85, pulse 88, temperature 98 F (36.7 C), temperature source Oral, resp. rate 18, height 5\' 10"  (1.778 m), weight 127.7 kg, SpO2 100 %. Constitutional: No distress . Vital signs reviewed. HEENT: EOMI, oral membranes moist Neck: supple Cardiovascular: RRR without murmur. No JVD    Respiratory: CTA Bilaterally without wheezes or rales. Normal effort    GI: BS +, non-tender, non-distended  Skin: RLE clean with minimal s/s drainage, dressed Uro: scrotum swelling much improved Left BKA healed. Psych: Normal mood.  Normal behavior. Musc: Scrotal edema. Psych: still anxious but in better spirits Neurological: Alert. Motor:  RLE: 4-4+/5 proximal to distal, no changes Left lower extremity: Hip flexion, knee extension 4+/5, stable  Assessment/Plan: 1. Functional deficits secondary to debility which require 3+ hours per day of interdisciplinary therapy in a comprehensive inpatient rehab setting.  Physiatrist is providing close team supervision and 24 hour management of active medical problems listed below.  Physiatrist  and rehab team continue to assess barriers to discharge/monitor patient progress toward functional and medical goals  Care Tool:  Bathing    Body parts bathed by patient: Right arm, Left arm, Chest, Abdomen, Face   Body parts bathed by helper: Front perineal area, Buttocks, Right upper leg, Left upper leg, Right lower leg Body parts n/a: Left lower leg   Bathing assist Assist Level: Maximal Assistance - Patient 24 - 49%     Upper Body Dressing/Undressing Upper body dressing Upper body dressing/undressing activity did not occur (including orthotics): N/A(patient already had a gown) What is the patient wearing?: Hospital gown only    Upper body assist Assist Level: Total Assistance - Patient < 25%    Lower Body Dressing/Undressing Lower body dressing    Lower body dressing activity did not occur: Refused What is the patient wearing?: Hospital gown only     Lower body assist Assist for lower body dressing: Total Assistance - Patient < 25%     Toileting Toileting Toileting Activity did not occur Landscape architect and hygiene only): Safety/medical concerns  Toileting assist Assist for toileting: Dependent - Patient 0%     Transfers Chair/bed transfer  Transfers assist  Chair/bed transfer activity did not occur: Safety/medical concerns  Chair/bed transfer assist level: Minimal Assistance - Patient > 75%     Locomotion Ambulation   Ambulation assist   Ambulation activity did not occur: Safety/medical concerns(unable due to pain and scrotal and bladder issues)          Walk 10 feet activity   Assist  Walk 10 feet activity did not occur: Safety/medical concerns(unable due to pain and scrotal and bladder issues)        Walk 50 feet activity   Assist Walk 50 feet with 2 turns activity did not occur: Safety/medical concerns(unable due to pain and scrotal and bladder issues)         Walk 150 feet activity   Assist Walk 150 feet activity did not  occur: Safety/medical concerns(unable due to pain and scrotal and bladder issues)         Walk 10 feet on uneven surface  activity   Assist Walk 10 feet on uneven surfaces activity did not occur: Safety/medical concerns(unable due to pain and scrotal and bladder issues)         Wheelchair     Assist Will patient use wheelchair at discharge?: Yes Type of Wheelchair: Manual Wheelchair activity did not occur: Safety/medical concerns(unable due to pain and scrotal and bladder issues)  Wheelchair assist level: Supervision/Verbal cueing Max wheelchair distance: 40'    Wheelchair 50 feet with 2 turns activity    Assist    Wheelchair 50 feet with 2 turns activity did not occur: Safety/medical concerns(unable due to pain and scrotal and bladder issues)       Wheelchair 150 feet activity     Assist Wheelchair 150 feet activity did not occur: Safety/medical concerns(unable due to pain and scrotal and bladder issues)        Medical Problem List and Plan: 1.Debilitysecondary to end-stage renal disease/multi-medical. Hemodialysis recently initiated.  Cont CIR therapies  -with improvement in scrotal edema, decreased pain he's able to do more with therapies 2. Antithrombotics: -DVT/anticoagulation:Subcutaneous heparin -antiplatelet therapy: N/A 3. Pain Management:Neurontin 300 mg nightly, Ultram as needed  Overall improving 4. Mood:Celexa 40 mg daily -antipsychotic agents: N/A  -needs daily ego support 5. Neuropsych: This patientiscapable of making decisions on hisown behalf.  Discussed with neuropsychology-appreciate recs, no SI 6. Skin/Wound Care:Scrotal sling Local care to bullae, foam dressings--assume these are related to chronic edema  Appreciate Vasc recs - nonvascular RLE wounds  Appreciate further WOC recs  Surgery performed bx: "necrotic tissue with acute inflammation" - defer to surgery recs 7.  Fluids/Electrolytes/Nutrition:Routine in and outs 8.  ESRD:. Status post AV fistula 01/09/2019. Hemodialysis directed. -continue to diurese per nephro Filed Weights   03/02/19 1321 03/02/19 1737 03/03/19 0500  Weight: 130.4 kg 126.2 kg 127.7 kg   ?weights stable 9. Diabetes mellitus. Recent hemoglobin A1c 5.0.   Controlled on 8/14  Continue to monitor 10. Anemia of chronic disease. Continue Aranesp  Hemoglobin 8.0 on 8/11, recs per nephro, labs with HD 11. History of left BKA 5 years ago in [place].   12. Hypertension. Norvasc 10 mg daily.    Fair control 8/14 13. Morbid obesity. BMI 43.78. Dietary follow-up 14. Hyperlipidemia. Lipitor 15. History of syphilis. Treated outpatient with penicillin to the healthcare department recently. Outpatient follow-up 16. Urine retention:   Foley remains out  Urology following for now. May not need cysto,cath  continues on keflex for bladder proph  flomax to help with emptying, urecholine 25mg  tid---continue  -hopeful increased OOB will help with emptying  -continue lidocaine gel for urethral meatus for now 17.  Sleep disturbance  Melatonin 4.5 mg nightly   Cont CPAP 18.  Leukocytosis  WBCs 10.2  Afebrile  Continue to monitor 19.  Constipation  Bowel meds increased on 7/28, decreased on 7/31, decreased on 8/5  Had bm last night.   -still incontinent however  20.  Pruritus--generally improved  Local care, air to back  Benadryl   Added sarna and eucerin with good results  LOS: 23 days A FACE TO [place] 03/03/2019, 8:53 AM

## 2019-03-03 NOTE — Progress Notes (Signed)
Physical Therapy Session Note  Patient Details  Name: Michael Riggs MRN: [id number] Date of Birth: [birth year]  Today's Date: 03/03/2019 PT Individual Time: [id number] PT Individual Time Calculation (min): 38 min   Short Term Goals: Week 4:  PT Short Term Goal 1 (Week 4): =LTGs due to ELOS  Skilled Therapeutic Interventions/Progress Updates:   Pt in supine and agreeable to therapy, ongoing c/o R leg pain and scrotum pain as detailed below. Agreeable to get OOB this session. Supine>sit w/ supervision and heavy bedrail use. Static sitting balance w/ supervision as well. Therapist donned LLE prosthesis. Sit>stand from slightly elevated bed surface w/ CGA and therapist bracing RW. Stand pivot to w/c w/ CGA. Pt sat up in w/c to brush teeth and perform other self-care tasks at sink w/ supervision. Total assist w/c transport to/from gym. Worked on w/c parts management and mobility. Able to reposition him self in the chair minimally, total assist needed from therapist. Independent w/ brakes. Self-propelled w/c 25' x2 w/ BUEs. Ended session in w/c, all needs in reach. Agreeable to stay in w/c until 11:15 when next therapist comes.   Therapy Documentation Precautions:  Precautions Precautions: Fall, Other (comment) Precaution Comments: L BKA with prosthesis; significant scrotal edema Restrictions Weight Bearing Restrictions: No Vital Signs:  Pain: Pain Assessment Pain Scale: 0-10 Pain Score: 6  Pain Type: Acute pain Pain Location: Leg Pain Orientation: Right Pain Descriptors / Indicators: Aching Pain Frequency: Intermittent Pain Onset: On-going Patients Stated Pain Goal: 0 Pain Intervention(s): Medication (See eMAR);Repositioned Multiple Pain Sites: No  Therapy/Group: Individual Therapy  Lamoine Fredricksen [name] 03/03/2019, 11:45 AM

## 2019-03-04 ENCOUNTER — Inpatient Hospital Stay (HOSPITAL_COMMUNITY): Payer: Medicare Other

## 2019-03-04 ENCOUNTER — Inpatient Hospital Stay (HOSPITAL_COMMUNITY): Payer: Medicare Other | Admitting: Occupational Therapy

## 2019-03-04 ENCOUNTER — Inpatient Hospital Stay (HOSPITAL_COMMUNITY): Payer: Medicare Other | Admitting: Physical Therapy

## 2019-03-04 LAB — RENAL FUNCTION PANEL
Albumin: 1.8 g/dL — ABNORMAL LOW (ref 3.5–5.0)
Anion gap: 11 (ref 5–15)
BUN: 26 mg/dL — ABNORMAL HIGH (ref 6–20)
CO2: 24 mmol/L (ref 22–32)
Calcium: 8 mg/dL — ABNORMAL LOW (ref 8.9–10.3)
Chloride: 95 mmol/L — ABNORMAL LOW (ref 98–111)
Creatinine, Ser: 4.14 mg/dL — ABNORMAL HIGH (ref 0.61–1.24)
GFR calc Af Amer: 19 mL/min — ABNORMAL LOW (ref >60)
GFR calc non Af Amer: 16 mL/min — ABNORMAL LOW (ref >60)
Glucose, Bld: 174 mg/dL — ABNORMAL HIGH (ref 70–99)
Phosphorus: 2.2 mg/dL — ABNORMAL LOW (ref 2.5–4.6)
Potassium: 4.2 mmol/L (ref 3.5–5.1)
Sodium: 130 mmol/L — ABNORMAL LOW (ref 135–145)

## 2019-03-04 LAB — CBC
HCT: 27.1 % — ABNORMAL LOW (ref 39.0–52.0)
Hemoglobin: 8.2 g/dL — ABNORMAL LOW (ref 13.0–17.0)
MCH: 28.4 pg (ref 26.0–34.0)
MCHC: 30.3 g/dL (ref 30.0–36.0)
MCV: 93.8 fL (ref 80.0–100.0)
Platelets: 270 10*3/uL (ref 150–400)
RBC: 2.89 MIL/uL — ABNORMAL LOW (ref 4.22–5.81)
RDW: 14.2 % (ref 11.5–15.5)
WBC: 12 10*3/uL — ABNORMAL HIGH (ref 4.0–10.5)
nRBC: 0 % (ref 0.0–0.2)

## 2019-03-04 LAB — IRON AND TIBC
Iron: 19 ug/dL — ABNORMAL LOW (ref 45–182)
Saturation Ratios: 10 % — ABNORMAL LOW (ref 17.9–39.5)
TIBC: 199 ug/dL — ABNORMAL LOW (ref 250–450)
UIBC: 180 ug/dL

## 2019-03-04 LAB — FERRITIN: Ferritin: 244 ng/mL (ref 24–336)

## 2019-03-04 IMAGING — DX CHEST - 2 VIEW
2 series · 2 of 2 positions shown · non-contrast
Comparison: Chest x-rays dated [DATE] and [DATE].

CLINICAL DATA: Severe cough and weakness.

EXAM:
CHEST - 2 VIEW

[chest lat]
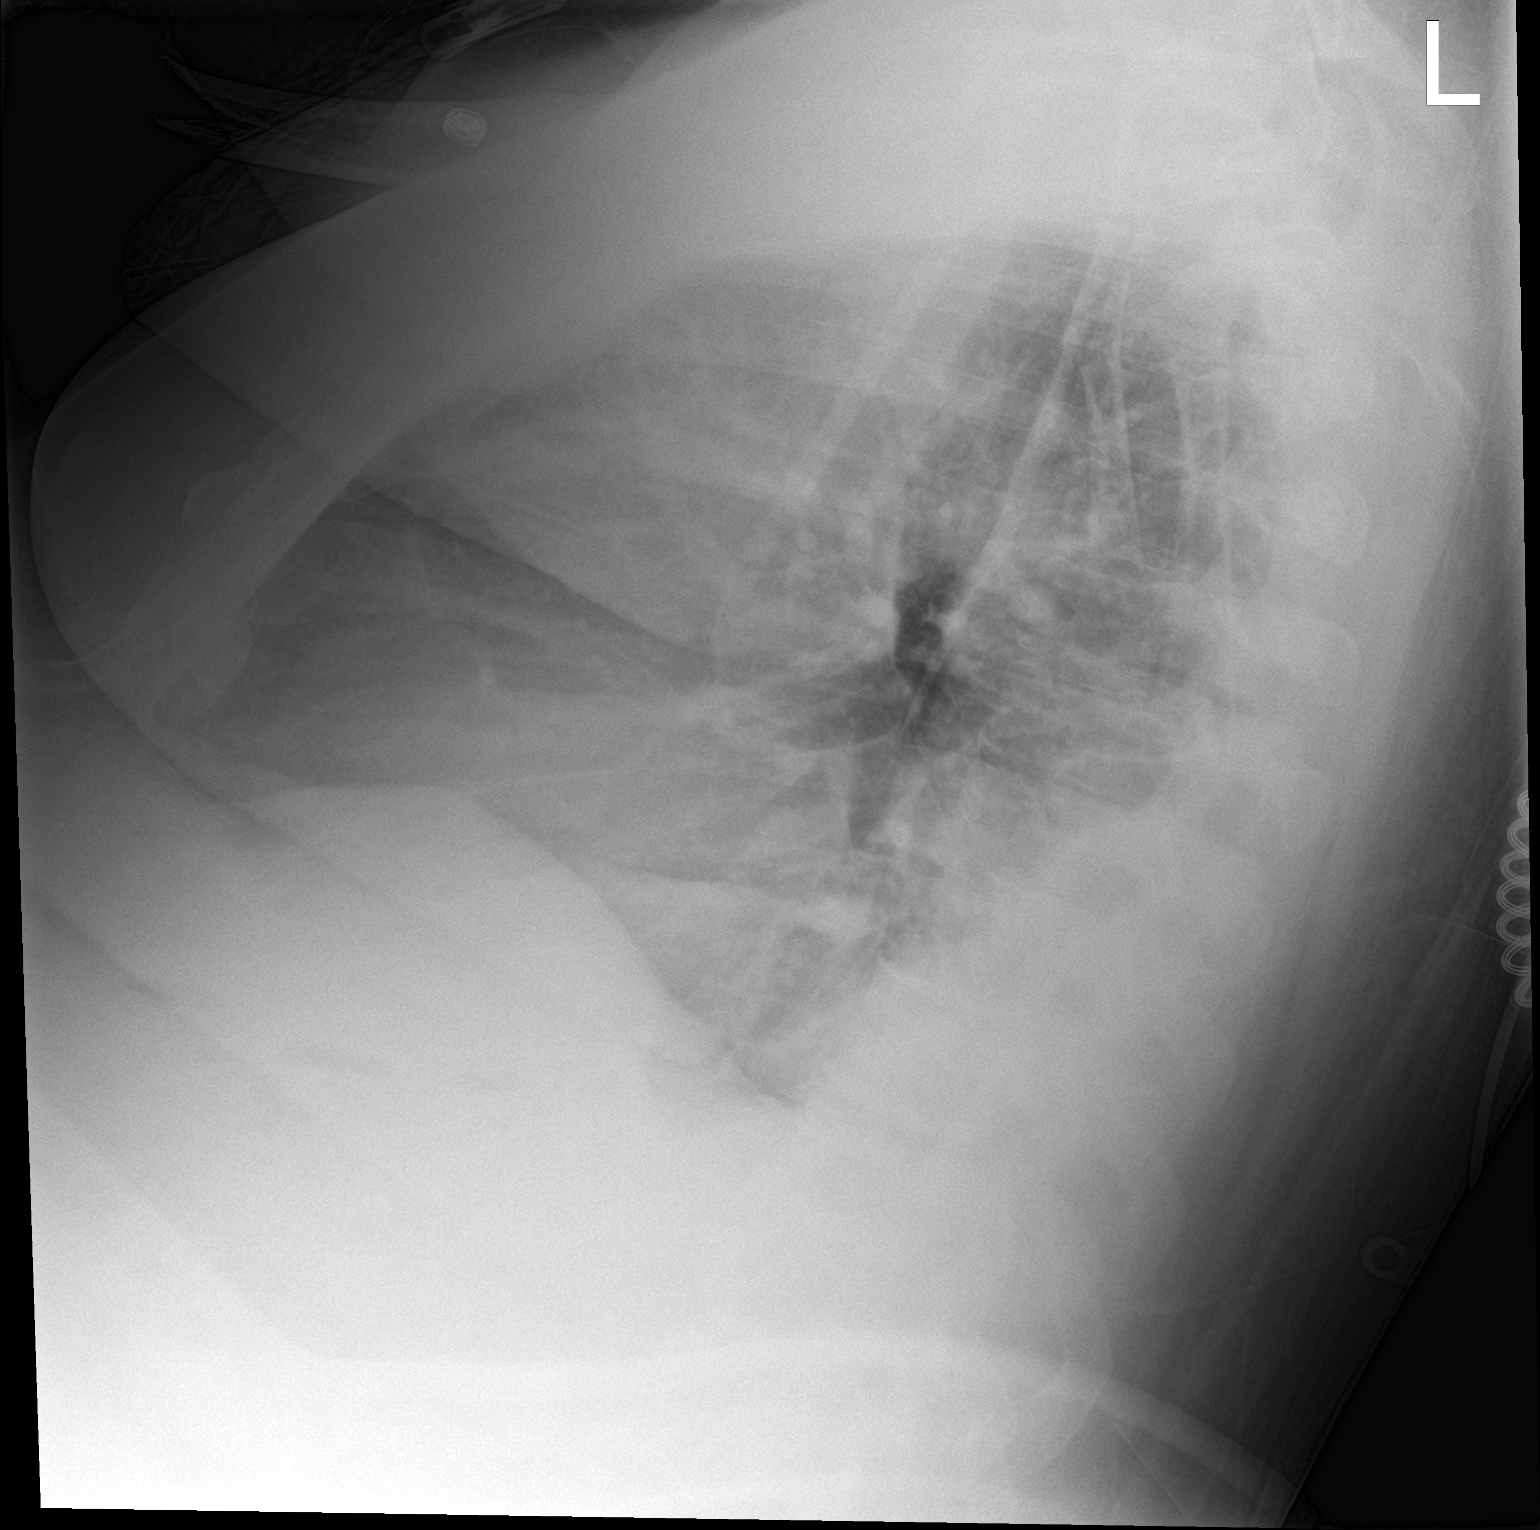

[chest ap]
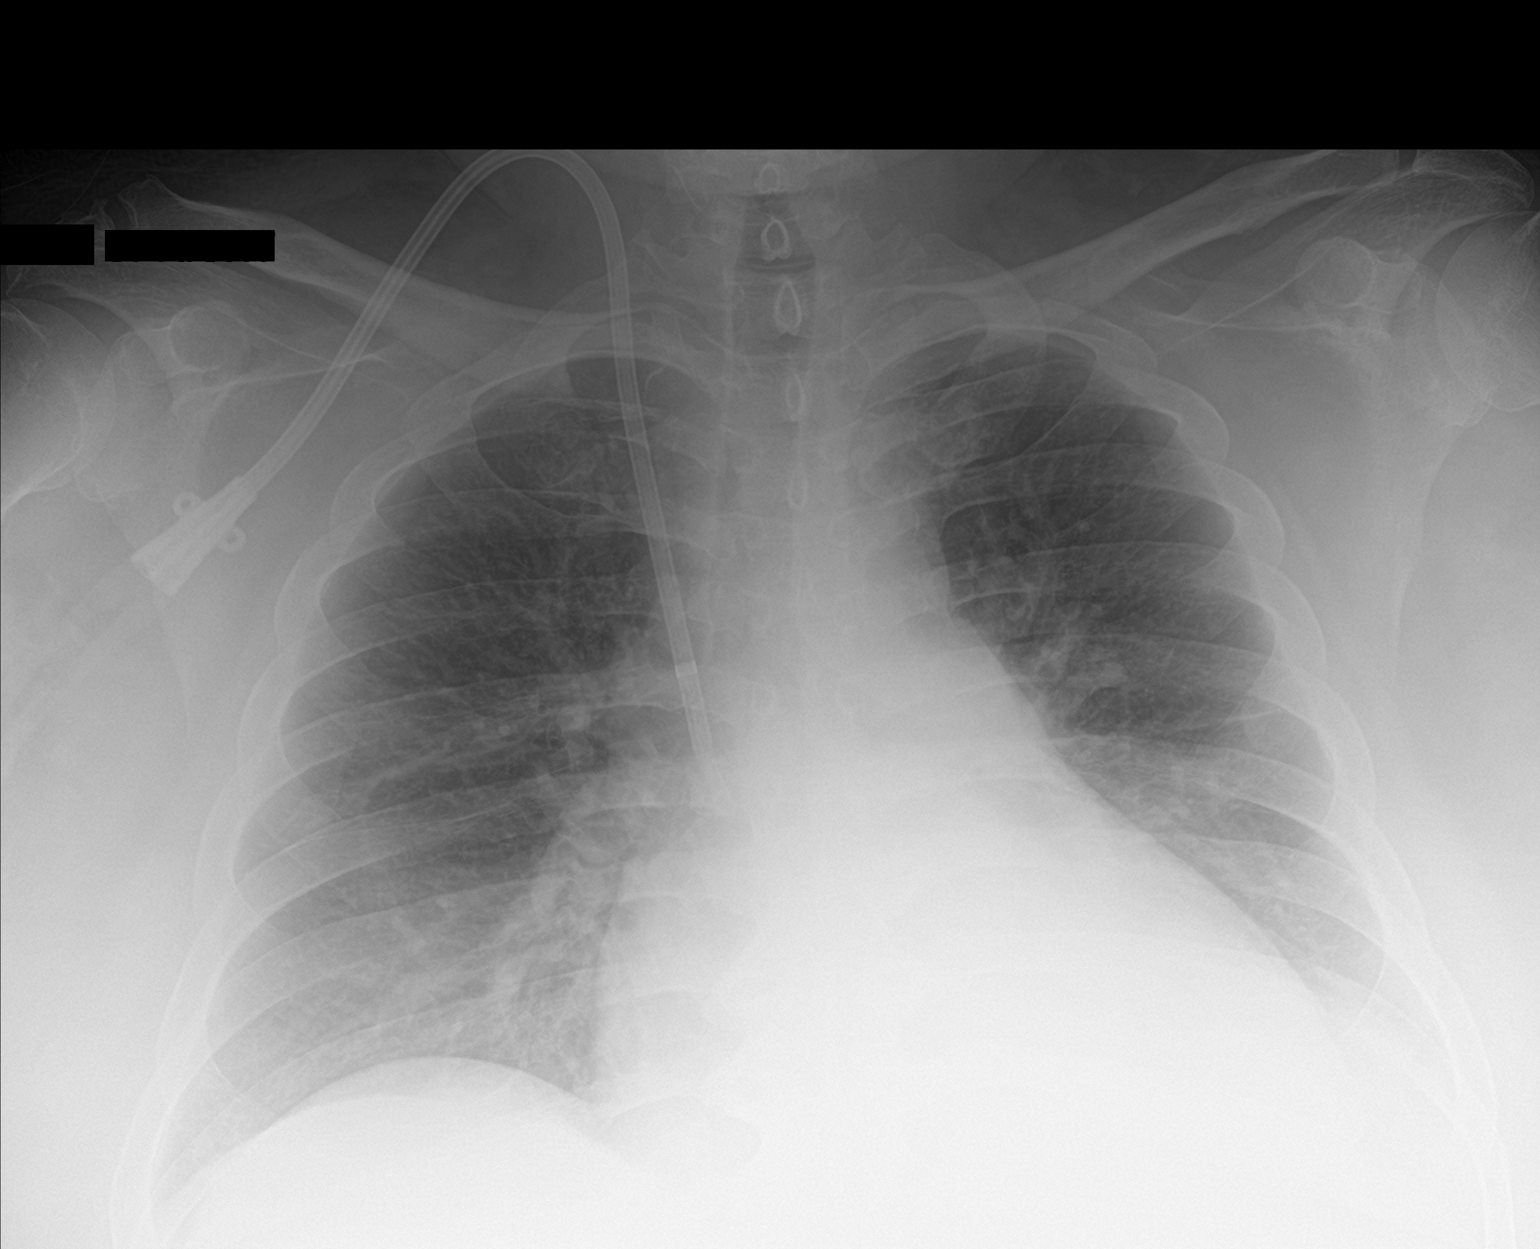

[2 of 2 positions shown; findings below may reference images not displayed]

FINDINGS: Stable cardiomegaly. LEFT basilar opacity, compatible with the small
pleural effusion and passive atelectasis demonstrated on earlier CT
abdomen. No new lung findings. RIGHT-sided central catheter is
stable in position with tip at the level of the mid/lower SVC.
IMPRESSION: Stable chest x-ray. Small LEFT pleural effusion and passive
atelectasis, as also demonstrated on earlier CT abdomen. No new lung
findings. Stable cardiomegaly.

## 2019-03-04 MED ORDER — FUROSEMIDE 40 MG PO TABS
80.0000 mg | ORAL_TABLET | Freq: Every day | ORAL | Status: DC
  Administered 2019-03-04 – 2019-03-14 (×11): 80 mg via ORAL
  Filled 2019-03-04 (×11): qty 2

## 2019-03-04 MED ORDER — POLYETHYLENE GLYCOL 3350 17 G PO PACK: 17.0000 g | PACK | Freq: Every day | ORAL | Status: DC | PRN

## 2019-03-04 MED ORDER — ACETAMINOPHEN 325 MG PO TABS
ORAL_TABLET | ORAL | Status: AC
  Filled 2019-03-04: qty 2

## 2019-03-04 NOTE — Progress Notes (Signed)
Patient alert and oriented; medications and treatment as ordered this shift; CPAP on as ordered. Patient removed CPAP throughout shift and writer educated patient about importance of keeping CPAP on. Per  patient requested RT called and assisted patient with CPAP placement. Pain medications as requested this shift.Patient in bed resting, no distress observed and CPAP on and intact.Will continue to monitor.

## 2019-03-04 NOTE — Progress Notes (Signed)
Physical Therapy Session Note  Patient Details  Name: Michael Riggs MRN: [id number] Date of Birth: [birth year]  Today's Date: 03/04/2019 PT Individual Time: 1049-1200 PT Individual Time Calculation (min): 71 min   Short Term Goals: Week 3:  PT Short Term Goal 1 (Week 3): =LTGs due to ELOS  Skilled Therapeutic Interventions/Progress Updates:    Pt received asleep, supine in bed easily awakens and agreeable to therapy session with encouragement. Supine>sit, HOB partially elevated and heavy reliance on bedrails, with min assist for trunk upright. Therapist donned R LE prosthetic. Pt tolerated sitting on EOB ~6-74minutes with supervision reporting less pain in his scrotum than he has had in the past. Pt agreeable to performing bed>w/c stand pivot transfer but then reported he had a BM. Pt not agreeable to attempt stand pivot to [facility] despite max encouragement and education on the similarities between [facility] transfer and w/c transfer. Therapist doffed R LE prosthetic. Sit>supine with supervision. Rolling R using bedrails with supervision. Pt able to maintain R sidelying using bedrails without assistance while therapist performed total assist LB clothing management and peri-care with increased time as pt incontinent of large BM.   Pt performed the following supine exercises with multimodal cuing throughout for proper technique: - R LE ankle PF/DF focusing on increased DF ROM 2x10 reps - L and R LE hip flexion with therapist providing manual resistance against L LE 2x10 reps - L LE quad/glute sets with 5 second hold 2x10 reps - R LE short arc quads 2x10 reps - pt reports increased pain with this exercise Provided rest breaks between exercises for pain management. Pt left supine in bed, HOB maximally elevated, meal tray set-up, and needs in reach.   Therapy Documentation Precautions:  Precautions Precautions: Fall, Other (comment) Precaution Comments: L BKA with prosthesis; significant scrotal  edema Restrictions Weight Bearing Restrictions: No  Pain: Pt reports pain in R LE is greater than in scrotum and that the scrotum pain while sitting in w/c has significantly improved. Reports R LE pain rated as 10/10 at rest increasing to 10+/10 during activity - RN notified and present for medication administration.   Therapy/Group: Individual Therapy  Tawana Scale, PT, DPT 03/04/2019, 8:02 AM

## 2019-03-04 NOTE — Progress Notes (Addendum)
[place] PHYSICAL MEDICINE & REHABILITATION PROGRESS NOTE   Subjective/Complaints: Pt c/o 2-3 BMs per day , would like to reduce miralax , no abd pain   Right big toe numbness and pain   C/o cough at night , no reflux, non productive   ROS: Patient denies  sore throat, blurred vision, nausea, vomiting, diarrhea, cough, shortness of breath or chest pain, headache, or mood change.    Objective:   No results found. Recent Labs    03/01/19 0953 03/02/19 1400  WBC 10.2 12.6*  HGB 8.5* 7.6*  HCT 27.5* 25.6*  PLT 302 324   Recent Labs    03/01/19 0953 03/02/19 1400  NA 130* 129*  K 3.7 4.0  CL 94* 93*  CO2 24 24  GLUCOSE 158* 180*  BUN 23* 23*  CREATININE 4.49* 4.28*  CALCIUM 8.1* 7.9*    Intake/Output Summary (Last 24 hours) at 03/04/2019 0754 Last data filed at 03/03/2019 1900 Gross per 24 hour  Intake 1320 ml  Output 4000 ml  Net -2680 ml     Physical Exam: Vital Signs Blood pressure (!) 145/88, pulse 87, temperature 97.6 F (36.4 C), resp. rate 16, height 5\' 10"  (1.778 m), weight 124.1 kg, SpO2 100 %. Constitutional: No distress . Vital signs reviewed. HEENT: EOMI, oral membranes moist Neck: supple Cardiovascular: RRR without murmur. No JVD    Respiratory: CTA Bilaterally without wheezes or rales. Normal effort    GI: BS +, non-tender, non-distended  Skin: RLE clean with minimal s/s drainage, dressed Uro: scrotum swelling much improved Left BKA healed. Psych: Normal mood.  Normal behavior. Musc: Scrotal edema. Psych: still anxious but in better spirits Neurological: Alert. Motor:  RLE: 4-4+/5 proximal to distal, no changes Left lower extremity: Hip flexion, knee extension 4+/5, stable  Assessment/Plan: 1. Functional deficits secondary to debility which require 3+ hours per day of interdisciplinary therapy in a comprehensive inpatient rehab setting.  Physiatrist is providing close team supervision and 24 hour management of active medical problems  listed below.  Physiatrist and rehab team continue to assess barriers to discharge/monitor patient progress toward functional and medical goals  Care Tool:  Bathing    Body parts bathed by patient: Right arm, Left arm, Chest, Abdomen, Face   Body parts bathed by helper: Front perineal area, Buttocks, Right upper leg, Left upper leg, Right lower leg Body parts n/a: Left lower leg   Bathing assist Assist Level: Maximal Assistance - Patient 24 - 49%     Upper Body Dressing/Undressing Upper body dressing Upper body dressing/undressing activity did not occur (including orthotics): N/A(patient already had a gown) What is the patient wearing?: Hospital gown only    Upper body assist Assist Level: Total Assistance - Patient < 25%    Lower Body Dressing/Undressing Lower body dressing    Lower body dressing activity did not occur: Refused What is the patient wearing?: Hospital gown only     Lower body assist Assist for lower body dressing: Total Assistance - Patient < 25%     Toileting Toileting Toileting Activity did not occur Landscape architect and hygiene only): Safety/medical concerns  Toileting assist Assist for toileting: Dependent - Patient 0%     Transfers Chair/bed transfer  Transfers assist  Chair/bed transfer activity did not occur: Safety/medical concerns  Chair/bed transfer assist level: Contact Guard/Touching assist     Locomotion Ambulation   Ambulation assist   Ambulation activity did not occur: Safety/medical concerns(unable due to pain and scrotal and bladder issues)  Walk 10 feet activity   Assist  Walk 10 feet activity did not occur: Safety/medical concerns(unable due to pain and scrotal and bladder issues)        Walk 50 feet activity   Assist Walk 50 feet with 2 turns activity did not occur: Safety/medical concerns(unable due to pain and scrotal and bladder issues)         Walk 150 feet activity   Assist Walk 150  feet activity did not occur: Safety/medical concerns(unable due to pain and scrotal and bladder issues)         Walk 10 feet on uneven surface  activity   Assist Walk 10 feet on uneven surfaces activity did not occur: Safety/medical concerns(unable due to pain and scrotal and bladder issues)         Wheelchair     Assist Will patient use wheelchair at discharge?: Yes Type of Wheelchair: Manual Wheelchair activity did not occur: Safety/medical concerns(unable due to pain and scrotal and bladder issues)  Wheelchair assist level: Supervision/Verbal cueing Max wheelchair distance: 40'    Wheelchair 50 feet with 2 turns activity    Assist    Wheelchair 50 feet with 2 turns activity did not occur: Safety/medical concerns(unable due to pain and scrotal and bladder issues)       Wheelchair 150 feet activity     Assist Wheelchair 150 feet activity did not occur: Safety/medical concerns(unable due to pain and scrotal and bladder issues)        Medical Problem List and Plan: 1.Debilitysecondary to end-stage renal disease/multi-medical. Hemodialysis recently initiated.  Cont CIR therapies PT, OT,   2. Antithrombotics: -DVT/anticoagulation:Subcutaneous heparin -antiplatelet therapy: N/A 3. Pain Management:Neurontin 300 mg nightly, Ultram as needed  Overall improving 4. Mood:Celexa 40 mg daily -antipsychotic agents: N/A  -needs daily ego support 5. Neuropsych: This patientiscapable of making decisions on hisown behalf.  Discussed with neuropsychology-appreciate recs, no SI 6. Skin/Wound Care:Scrotal sling Local care to bullae, foam dressings--assume these are related to chronic edema  Appreciate Vasc recs - nonvascular RLE wounds  Appreciate further WOC recs  Surgery performed bx: "necrotic tissue with acute inflammation" - defer to surgery recs 7. Fluids/Electrolytes/Nutrition:Routine in and outs 8.  ESRD:.  Status post AV fistula 01/09/2019. Hemodialysis directed. -continue to diurese per nephro Filed Weights   03/03/19 1400 03/03/19 1740 03/04/19 0320  Weight: 129.5 kg 125.5 kg 124.1 kg   ?weights stable 9. Diabetes mellitus. Recent hemoglobin A1c 5.0.   Controlled on 8/14  Continue to monitor 10. Anemia of chronic disease. Continue Aranesp  Hemoglobin 8.0 on 8/11, recs per nephro, labs with HD 11. History of left BKA 5 years ago in [place].   12. Hypertension. Norvasc 10 mg daily.    Fair control 8/14 13. Morbid obesity. BMI 43.78. Dietary follow-up 14. Hyperlipidemia. Lipitor 15. History of syphilis. Treated outpatient with penicillin to the healthcare department recently. Outpatient follow-up 16. Urine retention:   Foley remains out  Urology following for now. May not need cysto,cath  continues on keflex for bladder proph  flomax to help with emptying, urecholine 25mg  tid---continue  -hopeful increased OOB will help with emptying  -continue lidocaine gel for urethral meatus for now 17.  Sleep disturbance  Melatonin 4.5 mg nightly   Cont CPAP 18.  Leukocytosis  WBCs 12K  Afebrile  Continue to monitor 19.  Constipation  Bowel meds increased on 7/28, decreased on 7/31, decreased on 8/5  Had bm several times, change miralax to prn 20.  Pruritus--generally improved  Local care, air to back  Benadryl   Added sarna and eucerin with good results 21 Hyponatremia as per nephro LOS: 24 days A FACE TO [place] Brillhart 03/04/2019, 7:54 AM

## 2019-03-04 NOTE — Procedures (Signed)
I evaluated the patient while on hemodialysis.  I reviewed the patient's treatment plan.  Patient tolerating dialysis well.  Discussed with RN.  

## 2019-03-04 NOTE — Progress Notes (Signed)
Occupational Therapy Session Note  Patient Details  Name: Michael Riggs MRN: [id number] Date of Birth: Dec 02, 1970  Today's Date: 03/04/2019 OT Individual Time: [id number] OT Individual Time Calculation (min): 54 min    Short Term Goals: Week 2:  OT Short Term Goal 1 (Week 2): Pt will tolerate sitting EOB with MIN A for sitting balance for 10 min during functional activity OT Short Term Goal 1 - Progress (Week 2): Progressing toward goal OT Short Term Goal 2 (Week 2): Pt will transfer to [facility] with MOD A of 1 caregiver to decrease caregiver burden OT Short Term Goal 2 - Progress (Week 2): Progressing toward goal OT Short Term Goal 3 (Week 2): Pt will don sock with AE PRN and min VC for technique OT Short Term Goal 3 - Progress (Week 2): Progressing toward goal OT Short Term Goal 4 (Week 2): Pt will bathe with MIN A OT Short Term Goal 4 - Progress (Week 2): Progressing toward goal  Skilled Therapeutic Interventions/Progress Updates: Patient stated he was fatigued and really was not up to therapy today and that he was fatigued from not enough rest or comfort and further stated his body would hurt of feel uncomfortable moving without his ,  'scrotum protective slings'  He was able to wash a portion of his upper body with Min A due to body girth and patient stating movements were uncomfortable for his scrotum and right leg.   He refused to work edge of bed again concern of moving without protective slings (They were in the bathroom drying as he stated they were soiled from a BM that occurred over night or early morning). Some discussion was made that he slings would shrink in dryer; so he asked that they remain in the bathroom hanging to dry.  He did complete oral care sitting supine in bed as he stated he could not sit edge of bed without slings.    He voiced concerns that he has difficulty moving himself about in bed and concurred to work on that.    However as he started to move  around, a foul odor was noticed.    He needed a brief change.    This clinician provided cues and encouragement to wash his front periarea.   He was cued for washing his buttocks and this clinician to clean 'in the back middle' but his arms were too short for his girth to thoroughly clean.    He required min A for lateral leans to ease caregiver burden of cleanup.   He was able to wash a portion of upper legs and asked this clinician to apply ointment on his right quadricep which he stated was sore.     He reached a portion with cues on how to use his bed controls and move his right leg into positioning.  He stated it was difficult and fatiguing.     Primary therapist gave suggestion of working on bed mobility, and since patient stated he has some difficulty with this task.    This clinician allowed him more opportunities to work on core strengthening to help him increase strength and ability to pull up in bed and roll and attempt to move trunk forward (including a bit of hip flexion) to help with independence with moving in bed and lateral rolls for mobility.    He demonstrated understanding already on utilizing bed controls.       Patient coughed during session and spoke with the MD regarding the cough  during MD around this am.     Patient stated he was fatigued and needed to rest after the bed mobility     His call bell and phone were within reach at the end of the session.  He was left with his nurse for care  Continue OT Plan of Care  Therapy Documentation Precautions:  Precautions Precautions: Fall, Other (comment) Precaution Comments: L BKA with prosthesis; significant scrotal edema Restrictions Weight Bearing Restrictions: No General: Amount of OT missed time=11 minutes  Pain:denied but stated, his leg was sore with all his movements but did not request pain meds.   He asked this clinician to not touch his leg except to help him apply ointment to the area all around his quadricep  area   Therapy/Group: Individual Therapy  Alfredia Ferguson [facility] 03/04/2019, 12:29 PM

## 2019-03-04 NOTE — Progress Notes (Signed)
[facility]    NEPHROLOGY PROGRESS NOTE  SUBJECTIVE: Patient seen and examined.  Working with physical therapy to get out of bed.  Denies any chest pain, shortness of breath, nausea, vomiting, diarrhea or dysuria.  No other specific complaints noted.  Continues to have significant scrotal and sacral edema limiting movement but reports it is significantly improved.  For dialysis again today.    OBJECTIVE:  Vitals:   03/04/19 0320 03/04/19 1222  BP: (!) 145/88 (!) 131/105  Pulse: 87 92  Resp: 16 20  Temp: 97.6 F (36.4 C) 98 F (36.7 C)  SpO2: 100% 99%    Intake/Output Summary (Last 24 hours) at 03/04/2019 1332 Last data filed at 03/04/2019 [id number] Gross per 24 hour  Intake 720 ml  Output 4000 ml  Net -3280 ml      General:  AAOx3 NAD HEENT: MMM [place] AT anicteric sclera Neck:  No JVD, no adenopathy CV:  Heart RRR  Lungs:  L/S CTA bilaterally Abd:  abd SNT/ND with normal BS, obese GU:  Bladder non-palpable, positive scrotal edema Extremities: Left BKA, trace bilateral edema, right lower extremity wound bandaged Skin:  No skin rash  MEDICATIONS:  . sodium chloride   Intravenous Once  . atorvastatin  40 mg Oral Daily  . bethanechol  25 mg Oral TID  . calcitRIOL  0.25 mcg Oral Daily  . calcium carbonate  1 tablet Oral BID WC  . Chlorhexidine Gluconate Cloth  6 each Topical Q0600  . citalopram  40 mg Oral Daily  . collagenase   Topical Daily  . darbepoetin (ARANESP) injection - DIALYSIS  150 mcg Intravenous Q Tue-HD  . gabapentin  300 mg Oral QHS  . heparin  5,000 Units Subcutaneous Q8H  . hydrocerin   Topical BID  . lidocaine  20 mL Other Once  . lidocaine   Topical 5 X Daily  . Melatonin  4.5 mg Oral QHS  . pantoprazole  40 mg Oral Daily  . tamsulosin  0.4 mg Oral QPC breakfast       LABS:   CBC Latest Ref Rng & Units 03/04/2019 03/02/2019 03/01/2019  WBC 4.0 - 10.5 K/uL 12.0(H) 12.6(H) 10.2  Hemoglobin 13.0 - 17.0 g/dL 8.2(L) 7.6(L) 8.5(L)   Hematocrit 39.0 - 52.0 % 27.1(L) 25.6(L) 27.5(L)  Platelets 150 - 400 K/uL 270 324 302    CMP Latest Ref Rng & Units 03/04/2019 03/02/2019 03/01/2019  Glucose 70 - 99 mg/dL 174(H) 180(H) 158(H)  BUN 6 - 20 mg/dL 26(H) 23(H) 23(H)  Creatinine 0.61 - 1.24 mg/dL 4.14(H) 4.28(H) 4.49(H)  Sodium 135 - 145 mmol/L 130(L) 129(L) 130(L)  Potassium 3.5 - 5.1 mmol/L 4.2 4.0 3.7  Chloride 98 - 111 mmol/L 95(L) 93(L) 94(L)  CO2 22 - 32 mmol/L 24 24 24   Calcium 8.9 - 10.3 mg/dL 8.0(L) 7.9(L) 8.1(L)  Total Protein 6.5 - 8.1 g/dL - - -  Total Bilirubin 0.3 - 1.2 mg/dL - - -  Alkaline Phos 38 - 126 U/L - - -  AST 15 - 41 U/L - - -  ALT 0 - 44 U/L - - -    Lab Results  Component Value Date   PTH 144 (H) 02/03/2019   CALCIUM 8.0 (L) 03/04/2019   CAION 1.01 (L) 01/30/2019   PHOS 2.2 (L) 03/04/2019       Component Value Date/Time   COLORURINE AMBER (A) 02/13/2019 1325   APPEARANCEUR CLOUDY (A) 02/13/2019 1325   LABSPEC 1.022 02/13/2019 [place]  5.0 02/13/2019 1325   GLUCOSEU 150 (A) 02/13/2019 1325   HGBUR LARGE (A) 02/13/2019 1325   BILIRUBINUR NEGATIVE 02/13/2019 1325   KETONESUR NEGATIVE 02/13/2019 1325   PROTEINUR >=300 (A) 02/13/2019 1325   NITRITE NEGATIVE 02/13/2019 1325   LEUKOCYTESUR TRACE (A) 02/13/2019 1325      Component Value Date/Time   PHART 7.221 (L) 01/31/2019 1115   PCO2ART 34.9 01/31/2019 1115   PO2ART 80.0 (L) 01/31/2019 1115   HCO3 13.8 (L) 01/31/2019 1115   TCO2 12 (L) 01/30/2019 2339   ACIDBASEDEF 12.4 (H) 01/31/2019 1115   O2SAT 93.8 01/31/2019 1115       Component Value Date/Time   IRON 19 (L) 03/04/2019 1013   TIBC 199 (L) 03/04/2019 1013   FERRITIN 244 03/04/2019 1013   IRONPCTSAT 10 (L) 03/04/2019 1013       ASSESSMENT/PLAN:    1. ESRD- Initiated HD on 01/31/19 and has been tolerating HD well and will eventually go to [place] [facility] after discharge on a TTS schedule. 1. Given anasarca will continue with dailyHD for volume  management 2. We will add furosemide orally to assist with volume removal. 3. Will get back on TTS schedule when swelling has improved for him to sit up in a wheelchair/recliner 2. Volume overload- has significant idwg and scrotaland abdominal edema. Question bed scale and will need to weigh him standing if possible or on hoyer lift. 1. Will plan for daily HD to help with volume management but continue to reinforce the need for fluid restriction 2. He needs to be able to sit up in a wheelchair for outpatient dialysis and he reports that his scrotal edema prevents this, hence aggressive HD/UF. 3. We will add furosemide. 3. 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 Urology has been consulted and plan for removal of foley catheter today. If he fails trial off of cath will likely require operative procedure. Will plan for daily HD to help improve edema 4. Anemia of ESRD- h/h stable. On ESA.  Recheck iron studies 5. SHPTH- on calcitriol.  Recheck PTH (pending).  Phos stable 6. Latent syphilis- per primary svc 7. 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 Wound care consulted. 8. Deconditioning- cont with PT/OT 9. Disposition- pending ability to sit in a chair for prolonged periods of time to facilitate transfer to and from HD from SNF.    [place], DO, [place]

## 2019-03-05 ENCOUNTER — Inpatient Hospital Stay (HOSPITAL_COMMUNITY): Payer: Medicare Other

## 2019-03-05 LAB — PARATHYROID HORMONE, INTACT (NO CA): PTH: 100 pg/mL — ABNORMAL HIGH (ref 15–65)

## 2019-03-05 NOTE — Progress Notes (Signed)
[facility] PHYSICAL MEDICINE & REHABILITATION PROGRESS NOTE   Subjective/Complaints: Received Miralx yesterday am , now changed to prn.  Pt states BM trigers incont urination at night   C/o cough at night , no reflux, non productive   ROS: Patient denies   nausea, vomiting, diarrhea, cough, shortness of breath or chest pain, headache, or mood change.    Objective:   Dg Chest 2 View  Result Date: 03/04/2019 CLINICAL DATA:  Severe cough and weakness. EXAM: CHEST - 2 VIEW COMPARISON:  Chest x-rays dated 02/04/2019 and 02/03/2019. FINDINGS: Stable cardiomegaly. LEFT basilar opacity, compatible with the small pleural effusion and passive atelectasis demonstrated on earlier CT abdomen. No new lung findings. RIGHT-sided central catheter is stable in position with tip at the level of the mid/lower SVC. IMPRESSION: Stable chest x-ray. Small LEFT pleural effusion and passive atelectasis, as also demonstrated on earlier CT abdomen. No new lung findings. Stable cardiomegaly. Electronically Signed   By: Franki Cabot M.D.   On: 03/04/2019 11:33   Recent Labs    03/02/19 1400 03/04/19 1013  WBC 12.6* 12.0*  HGB 7.6* 8.2*  HCT 25.6* 27.1*  PLT 324 270   Recent Labs    03/02/19 1400 03/04/19 1013  NA 129* 130*  K 4.0 4.2  CL 93* 95*  CO2 24 24  GLUCOSE 180* 174*  BUN 23* 26*  CREATININE 4.28* 4.14*  CALCIUM 7.9* 8.0*    Intake/Output Summary (Last 24 hours) at 03/05/2019 0806 Last data filed at 03/04/2019 1853 Gross per 24 hour  Intake 600 ml  Output 5200 ml  Net -4600 ml     Physical Exam: Vital Signs Blood pressure (!) 144/86, pulse 88, temperature 98.3 F (36.8 C), temperature source Oral, resp. rate 20, height 5\' 10"  (1.778 m), weight 122.7 kg, SpO2 100 %. Constitutional: No distress . Vital signs reviewed. HEENT: EOMI, oral membranes moist Neck: supple Cardiovascular: RRR without murmur. No JVD    Respiratory: CTA Bilaterally without wheezes or rales. Normal effort    GI:  BS +, non-tender, non-distended  Skin: RLE clean with minimal s/s drainage, dressed Uro: scrotum swelling much improved Left BKA healed. Psych: Normal mood.  Normal behavior. Musc: Scrotal edema. Psych: still anxious but in better spirits Neurological: Alert. Motor:  RLE: 4-4+/5 proximal to distal, no changes Left lower extremity: Hip flexion, knee extension 4+/5, stable  Assessment/Plan: 1. Functional deficits secondary to debility which require 3+ hours per day of interdisciplinary therapy in a comprehensive inpatient rehab setting.  Physiatrist is providing close team supervision and 24 hour management of active medical problems listed below.  Physiatrist and rehab team continue to assess barriers to discharge/monitor patient progress toward functional and medical goals  Care Tool:  Bathing    Body parts bathed by patient: Right arm, Left arm, Chest, Abdomen, Face   Body parts bathed by helper: Front perineal area, Buttocks, Right upper leg, Left upper leg, Right lower leg Body parts n/a: Left lower leg   Bathing assist Assist Level: Maximal Assistance - Patient 24 - 49%     Upper Body Dressing/Undressing Upper body dressing Upper body dressing/undressing activity did not occur (including orthotics): N/A(patient already had a gown) What is the patient wearing?: Hospital gown only    Upper body assist Assist Level: Total Assistance - Patient < 25%    Lower Body Dressing/Undressing Lower body dressing    Lower body dressing activity did not occur: Refused What is the patient wearing?: Hospital gown only  Lower body assist Assist for lower body dressing: Total Assistance - Patient < 25%     Toileting Toileting Toileting Activity did not occur Landscape architect and hygiene only): Safety/medical concerns  Toileting assist Assist for toileting: Dependent - Patient 0%     Transfers Chair/bed transfer  Transfers assist  Chair/bed transfer activity did not  occur: Safety/medical concerns  Chair/bed transfer assist level: Contact Guard/Touching assist     Locomotion Ambulation   Ambulation assist   Ambulation activity did not occur: Safety/medical concerns(unable due to pain and scrotal and bladder issues)          Walk 10 feet activity   Assist  Walk 10 feet activity did not occur: Safety/medical concerns(unable due to pain and scrotal and bladder issues)        Walk 50 feet activity   Assist Walk 50 feet with 2 turns activity did not occur: Safety/medical concerns(unable due to pain and scrotal and bladder issues)         Walk 150 feet activity   Assist Walk 150 feet activity did not occur: Safety/medical concerns(unable due to pain and scrotal and bladder issues)         Walk 10 feet on uneven surface  activity   Assist Walk 10 feet on uneven surfaces activity did not occur: Safety/medical concerns(unable due to pain and scrotal and bladder issues)         Wheelchair     Assist Will patient use wheelchair at discharge?: Yes Type of Wheelchair: Manual Wheelchair activity did not occur: Safety/medical concerns(unable due to pain and scrotal and bladder issues)  Wheelchair assist level: Supervision/Verbal cueing Max wheelchair distance: 40'    Wheelchair 50 feet with 2 turns activity    Assist    Wheelchair 50 feet with 2 turns activity did not occur: Safety/medical concerns(unable due to pain and scrotal and bladder issues)       Wheelchair 150 feet activity     Assist Wheelchair 150 feet activity did not occur: Safety/medical concerns(unable due to pain and scrotal and bladder issues)        Medical Problem List and Plan: 1.Debilitysecondary to end-stage renal disease/multi-medical. Hemodialysis recently initiated.  Cont CIR therapies PT, OT,   2. Antithrombotics: -DVT/anticoagulation:Subcutaneous heparin -antiplatelet therapy: N/A 3. Pain  Management:Neurontin 300 mg nightly, Ultram as needed  Overall improving 4. Mood:Celexa 40 mg daily -antipsychotic agents: N/A  -needs daily ego support 5. Neuropsych: This patientiscapable of making decisions on hisown behalf.  Discussed with neuropsychology-appreciate recs, no SI 6. Skin/Wound Care:Scrotal sling Local care to bullae, foam dressings--assume these are related to chronic edema  Appreciate Vasc recs - nonvascular RLE wounds  Appreciate further WOC recs  Surgery performed bx: "necrotic tissue with acute inflammation" - defer to surgery recs 7. Fluids/Electrolytes/Nutrition:Routine in and outs 8.  ESRD:. Status post AV fistula 01/09/2019. Hemodialysis daily with large volume drawn off  -continue to diurese per nephro Filed Weights   03/04/19 1340 03/04/19 1745 03/05/19 0500  Weight: 126.7 kg 121.5 kg 122.7 kg   ?weights stable 9. Diabetes mellitus. Recent hemoglobin A1c 5.0.   Controlled on 8/14  Continue to monitor 10. Anemia of chronic disease. Continue Aranesp  Hemoglobin 8.0 on 8/11, recs per nephro, labs with HD 11. History of left BKA 5 years ago in [place].   12. Hypertension. Norvasc 10 mg daily.    Fair control 8/14 13. Morbid obesity. BMI 43.78. Dietary follow-up 14. Hyperlipidemia. Lipitor 15. History of syphilis. Treated outpatient with  penicillin to the healthcare department recently. Outpatient follow-up 16. Urine retention:   Foley remains out  Urology following for now. May not need cysto,cath  continues on keflex for bladder proph  flomax to help with emptying, urecholine 25mg  tid---continue   increased OOB will help with emptying- bladder scans <232ml since 8/13  -continue lidocaine gel for urethral meatus for now 17.  Sleep disturbance  Melatonin 4.5 mg nightly   Cont CPAP 18.  Leukocytosis  WBCs 12K  Afebrile  Continue to monitor 19.  Constipation  Bowel meds  increased on 7/28, decreased on 7/31, decreased on 8/5  Had bm several times, change miralax to prn 20.  Pruritus--generally improved  Local care, air to back  Benadryl   Added sarna and eucerin with good results 21 Hyponatremia as per nephro LOS: 25 days A FACE TO Ila E [facility] 03/05/2019, 8:06 AM

## 2019-03-05 NOTE — Progress Notes (Signed)
[facility]    NEPHROLOGY PROGRESS NOTE  SUBJECTIVE: Patient seen and examined. Denies any chest pain, shortness of breath, nausea, vomiting, diarrhea or dysuria.  No other specific complaints noted.  Continues to have significant scrotal and sacral edema limiting movement but reports it is significantly improved.  Had dialysis yesterday without difficulty.    OBJECTIVE:  Vitals:   03/04/19 2016 03/05/19 0442  BP: 130/80 (!) 144/86  Pulse: 86 88  Resp: 20 20  Temp: 98.7 F (37.1 C) 98.3 F (36.8 C)  SpO2: 100% 100%    Intake/Output Summary (Last 24 hours) at 03/05/2019 1148 Last data filed at 03/05/2019 0745 Gross per 24 hour  Intake 480 ml  Output 5200 ml  Net -4720 ml      General:  AAOx3 NAD HEENT: MMM [name] AT anicteric sclera Neck:  No JVD, no adenopathy CV:  Heart RRR  Lungs:  L/S CTA bilaterally Abd:  abd SNT/ND with normal BS, obese GU:  Bladder non-palpable, positive scrotal edema Extremities: Left BKA, trace bilateral edema, right lower extremity wound bandaged Skin:  No skin rash  MEDICATIONS:  . sodium chloride   Intravenous Once  . atorvastatin  40 mg Oral Daily  . bethanechol  25 mg Oral TID  . calcitRIOL  0.25 mcg Oral Daily  . Chlorhexidine Gluconate Cloth  6 each Topical Q0600  . citalopram  40 mg Oral Daily  . collagenase   Topical Daily  . darbepoetin (ARANESP) injection - DIALYSIS  150 mcg Intravenous Q Tue-HD  . furosemide  80 mg Oral Daily  . gabapentin  300 mg Oral QHS  . heparin  5,000 Units Subcutaneous Q8H  . hydrocerin   Topical BID  . lidocaine  20 mL Other Once  . lidocaine   Topical 5 X Daily  . Melatonin  4.5 mg Oral QHS  . pantoprazole  40 mg Oral Daily  . tamsulosin  0.4 mg Oral QPC breakfast       LABS:   CBC Latest Ref Rng & Units 03/04/2019 03/02/2019 03/01/2019  WBC 4.0 - 10.5 K/uL 12.0(H) 12.6(H) 10.2  Hemoglobin 13.0 - 17.0 g/dL 8.2(L) 7.6(L) 8.5(L)  Hematocrit 39.0 - 52.0 % 27.1(L) 25.6(L) 27.5(L)   Platelets 150 - 400 K/uL 270 324 302    CMP Latest Ref Rng & Units 03/04/2019 03/02/2019 03/01/2019  Glucose 70 - 99 mg/dL 174(H) 180(H) 158(H)  BUN 6 - 20 mg/dL 26(H) 23(H) 23(H)  Creatinine 0.61 - 1.24 mg/dL 4.14(H) 4.28(H) 4.49(H)  Sodium 135 - 145 mmol/L 130(L) 129(L) 130(L)  Potassium 3.5 - 5.1 mmol/L 4.2 4.0 3.7  Chloride 98 - 111 mmol/L 95(L) 93(L) 94(L)  CO2 22 - 32 mmol/L 24 24 24   Calcium 8.9 - 10.3 mg/dL 8.0(L) 7.9(L) 8.1(L)  Total Protein 6.5 - 8.1 g/dL - - -  Total Bilirubin 0.3 - 1.2 mg/dL - - -  Alkaline Phos 38 - 126 U/L - - -  AST 15 - 41 U/L - - -  ALT 0 - 44 U/L - - -    Lab Results  Component Value Date   PTH 144 (H) 02/03/2019   CALCIUM 8.0 (L) 03/04/2019   CAION 1.01 (L) 01/30/2019   PHOS 2.2 (L) 03/04/2019       Component Value Date/Time   COLORURINE AMBER (A) 02/13/2019 1325   APPEARANCEUR CLOUDY (A) 02/13/2019 1325   LABSPEC 1.022 02/13/2019 1325   PHURINE 5.0 02/13/2019 1325   GLUCOSEU 150 (A) 02/13/2019 1325   HGBUR  LARGE (A) 02/13/2019 1325   BILIRUBINUR NEGATIVE 02/13/2019 1325   KETONESUR NEGATIVE 02/13/2019 1325   PROTEINUR >=300 (A) 02/13/2019 1325   NITRITE NEGATIVE 02/13/2019 1325   LEUKOCYTESUR TRACE (A) 02/13/2019 1325      Component Value Date/Time   PHART 7.221 (L) 01/31/2019 1115   PCO2ART 34.9 01/31/2019 1115   PO2ART 80.0 (L) 01/31/2019 1115   HCO3 13.8 (L) 01/31/2019 1115   TCO2 12 (L) 01/30/2019 2339   ACIDBASEDEF 12.4 (H) 01/31/2019 1115   O2SAT 93.8 01/31/2019 1115       Component Value Date/Time   IRON 19 (L) 03/04/2019 1013   TIBC 199 (L) 03/04/2019 1013   FERRITIN 244 03/04/2019 1013   IRONPCTSAT 10 (L) 03/04/2019 1013       ASSESSMENT/PLAN:    1. ESRD- Initiated HD on 01/31/19 and has been tolerating HD well and will eventually go to [place] [facility] after discharge on a TTS schedule. 1. Given anasarca will continue with dailyHD for volume management 2. Added furosemide orally to assist with volume  removal. 3. Will get back on TTS schedule when swelling has improved for him to sit up in a wheelchair/recliner 4. Will plan dialysis again tomorrow 2. Volume overload- has significant idwg and scrotaland abdominal edema. Question bed scale and will need to weigh him standing if possible or on hoyer lift. 1. Will plan for daily HD to help with volume management but continue to reinforce the need for fluid restriction 2. He needs to be able to sit up in a wheelchair for outpatient dialysis and he reports that his scrotal edema prevents this, hence aggressive HD/UF. 3. Added furosemide 3. 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 Urology managing.  Will plan for daily HD to help improve edema 4. Anemia of ESRD- h/h stable. On ESA.  Recheck iron studies 5. SHPTH- on calcitriol.  Recheck PTH (pending).  Phos stable off binder 6. Latent syphilis- per primary svc 7. 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 Wound care consulted. 8. Deconditioning- cont with PT/OT 9. Disposition- pending ability to sit in a chair for prolonged periods of time to facilitate transfer to and from HD from SNF.    [place], DO, [place]

## 2019-03-05 NOTE — Progress Notes (Signed)
Physical Therapy Note  Patient Details  Name: Adryen Johansson MRN: [id number] Date of Birth: [birth year] Today's Date: 03/05/2019    Patient missed 60 minutes of skilled PT due to refusal secondary to it's his birthday.  Unable to redirect or encourage.  Stated possibly would allow ADL later with OT.   Reginia Naas  [place], PT 03/05/2019, 10:45 AM

## 2019-03-05 NOTE — Progress Notes (Signed)
Occupational Therapy Note  Patient Details  Name: Zakory Zucconi MRN: [id number] Date of Birth: 08-Apr-1971  Today's Date: 03/05/2019 OT Missed Time: 11 Minutes Missed Time Reason: Patient unwilling/refused to participate without medical reason  Pt proudly stating that because it is his birthday he is "taking the day off." Refused any participation in scheduled 75 min session.    Curtis Sites 03/05/2019, 1:23 PM

## 2019-03-06 ENCOUNTER — Inpatient Hospital Stay (HOSPITAL_COMMUNITY): Payer: Medicare Other | Admitting: Occupational Therapy

## 2019-03-06 ENCOUNTER — Inpatient Hospital Stay (HOSPITAL_COMMUNITY): Payer: Medicare Other | Admitting: Physical Therapy

## 2019-03-06 MED ORDER — SODIUM CHLORIDE 0.9 % IV SOLN: 100.0000 mL | INTRAVENOUS | Status: DC | PRN

## 2019-03-06 MED ORDER — TRAMADOL HCL 50 MG PO TABS
ORAL_TABLET | ORAL | Status: AC
  Administered 2019-03-06: 50 mg via ORAL
  Filled 2019-03-06: qty 1

## 2019-03-06 MED ORDER — HEPARIN SODIUM (PORCINE) 1000 UNIT/ML DIALYSIS: 1000.0000 [IU] | INTRAMUSCULAR | Status: DC | PRN

## 2019-03-06 MED ORDER — ACETAMINOPHEN 325 MG PO TABS
ORAL_TABLET | ORAL | Status: AC
  Administered 2019-03-06: 650 mg via ORAL
  Filled 2019-03-06: qty 2

## 2019-03-06 MED ORDER — HEPARIN SODIUM (PORCINE) 1000 UNIT/ML IJ SOLN
INTRAMUSCULAR | Status: AC
  Filled 2019-03-06: qty 4

## 2019-03-06 MED ORDER — SODIUM CHLORIDE 0.9 % IV SOLN
125.0000 mg | INTRAVENOUS | Status: DC
  Administered 2019-03-06 – 2019-03-13 (×2): 125 mg via INTRAVENOUS
  Filled 2019-03-06 (×6): qty 10

## 2019-03-06 MED ORDER — ALTEPLASE 2 MG IJ SOLR: 2.0000 mg | Freq: Once | INTRAMUSCULAR | Status: DC | PRN

## 2019-03-06 MED ORDER — HEPARIN SODIUM (PORCINE) 1000 UNIT/ML IJ SOLN
INTRAMUSCULAR | Status: AC
  Filled 2019-03-06: qty 3

## 2019-03-06 NOTE — Progress Notes (Signed)
[facility]    NEPHROLOGY PROGRESS NOTE  SUBJECTIVE: Patient seen and examined. Denies any chest pain, shortness of breath, nausea, vomiting, diarrhea or dysuria.  No other specific complaints noted.  Continues to have significant scrotal and sacral edema limiting movement but reports it is significantly improved.  Scheduled for dialysis today.    OBJECTIVE:  Vitals:   03/05/19 1944 03/06/19 0402  BP: (!) 144/87 (!) 127/93  Pulse: 93 89  Resp: 18 18  Temp: 98 F (36.7 C) 98.3 F (36.8 C)  SpO2: 100% 100%    Intake/Output Summary (Last 24 hours) at 03/06/2019 1139 Last data filed at 03/06/2019 0740 Gross per 24 hour  Intake 600 ml  Output 1 ml  Net 599 ml      General:  AAOx3 NAD HEENT: MMM [place] AT anicteric sclera Neck:  No JVD, no adenopathy CV:  Heart RRR  Lungs:  L/S CTA bilaterally Abd:  abd SNT/ND with normal BS, obese GU:  Bladder non-palpable, positive scrotal edema Extremities: Left BKA, trace bilateral edema, right lower extremity wound bandaged Skin:  No skin rash  MEDICATIONS:  . sodium chloride   Intravenous Once  . atorvastatin  40 mg Oral Daily  . bethanechol  25 mg Oral TID  . calcitRIOL  0.25 mcg Oral Daily  . Chlorhexidine Gluconate Cloth  6 each Topical Q0600  . citalopram  40 mg Oral Daily  . collagenase   Topical Daily  . darbepoetin (ARANESP) injection - DIALYSIS  150 mcg Intravenous Q Tue-HD  . furosemide  80 mg Oral Daily  . gabapentin  300 mg Oral QHS  . heparin  5,000 Units Subcutaneous Q8H  . hydrocerin   Topical BID  . lidocaine  20 mL Other Once  . lidocaine   Topical 5 X Daily  . Melatonin  4.5 mg Oral QHS  . pantoprazole  40 mg Oral Daily  . tamsulosin  0.4 mg Oral QPC breakfast       LABS:   CBC Latest Ref Rng & Units 03/04/2019 03/02/2019 03/01/2019  WBC 4.0 - 10.5 K/uL 12.0(H) 12.6(H) 10.2  Hemoglobin 13.0 - 17.0 g/dL 8.2(L) 7.6(L) 8.5(L)  Hematocrit 39.0 - 52.0 % 27.1(L) 25.6(L) 27.5(L)  Platelets 150 -  400 K/uL 270 324 302    CMP Latest Ref Rng & Units 03/04/2019 03/02/2019 03/01/2019  Glucose 70 - 99 mg/dL 174(H) 180(H) 158(H)  BUN 6 - 20 mg/dL 26(H) 23(H) 23(H)  Creatinine 0.61 - 1.24 mg/dL 4.14(H) 4.28(H) 4.49(H)  Sodium 135 - 145 mmol/L 130(L) 129(L) 130(L)  Potassium 3.5 - 5.1 mmol/L 4.2 4.0 3.7  Chloride 98 - 111 mmol/L 95(L) 93(L) 94(L)  CO2 22 - 32 mmol/L 24 24 24   Calcium 8.9 - 10.3 mg/dL 8.0(L) 7.9(L) 8.1(L)  Total Protein 6.5 - 8.1 g/dL - - -  Total Bilirubin 0.3 - 1.2 mg/dL - - -  Alkaline Phos 38 - 126 U/L - - -  AST 15 - 41 U/L - - -  ALT 0 - 44 U/L - - -    Lab Results  Component Value Date   PTH 100 (H) 03/04/2019   CALCIUM 8.0 (L) 03/04/2019   CAION 1.01 (L) 01/30/2019   PHOS 2.2 (L) 03/04/2019       Component Value Date/Time   COLORURINE AMBER (A) 02/13/2019 1325   APPEARANCEUR CLOUDY (A) 02/13/2019 1325   LABSPEC 1.022 02/13/2019 1325   PHURINE 5.0 02/13/2019 1325   GLUCOSEU 150 (A) 02/13/2019 1325   HGBUR  LARGE (A) 02/13/2019 1325   BILIRUBINUR NEGATIVE 02/13/2019 1325   KETONESUR NEGATIVE 02/13/2019 1325   PROTEINUR >=300 (A) 02/13/2019 1325   NITRITE NEGATIVE 02/13/2019 1325   LEUKOCYTESUR TRACE (A) 02/13/2019 1325      Component Value Date/Time   PHART 7.221 (L) 01/31/2019 1115   PCO2ART 34.9 01/31/2019 1115   PO2ART 80.0 (L) 01/31/2019 1115   HCO3 13.8 (L) 01/31/2019 1115   TCO2 12 (L) 01/30/2019 2339   ACIDBASEDEF 12.4 (H) 01/31/2019 1115   O2SAT 93.8 01/31/2019 1115       Component Value Date/Time   IRON 19 (L) 03/04/2019 1013   TIBC 199 (L) 03/04/2019 1013   FERRITIN 244 03/04/2019 1013   IRONPCTSAT 10 (L) 03/04/2019 1013       ASSESSMENT/PLAN:    1. ESRD- Initiated HD on 01/31/19 and has been tolerating HD well and will eventually go to [place] [facility] after discharge on a TTS schedule. 1. Given anasarca will continue with dailyHD for volume management 2. Added furosemide orally to assist with volume removal. 3. Will get  back on TTS schedule when swelling has improved for him to sit up in a wheelchair/recliner 4. Will plan dialysis again today and tomorrow 2. Volume overload- has significant idwg and scrotaland abdominal edema. Question bed scale and will need to weigh him standing if possible or on hoyer lift. 1. Will plan for daily HD to help with volume management but continue to reinforce the need for fluid restriction 2. He needs to be able to sit up in a wheelchair for outpatient dialysis and he reports that his scrotal edema prevents this, hence aggressive HD/UF. 3. Added furosemide over the weekend. 3. 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 Urology managing.  Will plan for daily HD to help improve edema 4. Anemia of ESRD- h/h stable. On ESA.  Add iron with dialysis. 5. SHPTH- on calcitriol.  Recheck PTH (pending).  Phos stable off binder 6. Latent syphilis- per primary svc 7. 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 Wound care consulted. 8. Deconditioning- cont with PT/OT 9. Disposition- pending ability to sit in a chair for prolonged periods of time to facilitate transfer to and from HD from SNF.    [place], DO, [place]

## 2019-03-06 NOTE — Progress Notes (Signed)
Occupational Therapy Session Note  Patient Details  Name: Michael Riggs MRN: [id number] Date of Birth: 02-23-1971  Today's Date: 03/06/2019  Session 1 OT Individual Time: 3212-2482 OT Individual Time Calculation (min): 44 min   Session 2 OT Individual Time: 5003-7048 OT Individual Time Calculation (min): 28 min    Short Term Goals: Week 4:  OT Short Term Goal 1 (Week 4): Pt will tolerate wearing scrotal edema stockinette for 5 hours during the day OT Short Term Goal 2 (Week 4): Pt will complete grooming tasks from wc at the sink with set-up A OT Short Term Goal 3 (Week 4): Pt will tolerate sitting EOB for 2 mins in preparation for BADL tasks  Skilled Therapeutic Interventions/Progress Updates:  Session 1   Pt greeted semi-reclined in bed and agreeable to OT treatment session. Pt reports feeling like he has had a BM. Pt rolled to the R in bed with supervision for brief change, but pt had not had a BM. OT washed buttocks and donned clean brief. Pt with much improved scrotal edema today. OT applied compression stockinette to scrotum for continued edema control. Pt declined putting pants on today. Pt came to sitting EOB with min A and HOB elevated. Pt then needed OT assistance to doff shrinker sock and don prosthetic sleeve and prosthesis. OT encouraged pt to try to assist but he said he would fall back and had to hold himself up. OT tried to encourage pt to try while therapist assisted with balance but he refused. Pt completed sit<>stand from raised bed and RW with heavy pull on RW despite cues to push up from bed. Min A to then pivot to the wc, but did not control his descent, falling backwards into wc. Pt set-up for breakfast and ate breakfast sitting in wc.   Session 2 Pt greeted semi-reclined in bed. Pt reported he had a coughing spell and had an "accident." Pt incontinent of BM. Pt rolled L and R with supervision while OT provided total A peri-care and brief change. Pt also soiled  is compression on scrotum so OT washed out stockinette and placed it in the bathroom to dry. While pt rolled on his side, pt with incontinent episode of bladder all over the bed. OT provided pt with new clean gown as he had urine on his old one. Supervision rolling L and R to place new fitted bed sheet. Pt left semi-reclined in bed with bed alarm on and needs met.   Therapy Documentation Precautions:  Precautions Precautions: Fall, Other (comment) Precaution Comments: L BKA with prosthesis; significant scrotal edema Restrictions Weight Bearing Restrictions: No Pain: Pain Assessment Pain Scale: 0-10 Pain Score: 10-Worst pain ever Pain Type: Acute pain Pain Location: Leg Pain Orientation: Right Pain Descriptors / Indicators: Aching Pain Frequency: Constant Pain Onset: On-going Patients Stated Pain Goal: 6 Pain Intervention(s): Medication (See eMAR)   Therapy/Group: Individual Therapy  Valma Cava 03/06/2019, 12:29 PM

## 2019-03-06 NOTE — Progress Notes (Signed)
Physical Therapy Session Note  Patient Details  Name: Michael Riggs MRN: [id number] Date of Birth: July 13, 1971  Today's Date: 03/06/2019 PT Individual Time: [id number] PT Individual Time Calculation (min): 55 min   Short Term Goals: Week 4:  PT Short Term Goal 1 (Week 4): =LTGs due to ELOS  Skilled Therapeutic Interventions/Progress Updates:   Pt in w/c and agreeable to therapy, pain as detailed below. Set-up assist to brush teeth and shave face from seated level at sink. Total assist to reach lower parts of neck 2/2 body habitus in seated position preventing him from reaching. Discussed readiness to d/c to SNF, pt prefers going to SNF vs home so he can continue to get rehab and get stronger prior to d/c to home where he will only receive PRN assist. He is consistently CGA-min assist for stand pivot transfers and is tolerating sitting up in w/c for >1 hour. Set-up assist to doff and don new gown. Min assist sit<>stand after pt made several attempts at standing w/o assist from therapist. Pt pulled up on therapist's hands to stand, transferred his hands to RW w/o assist from therapist, CGA stand pivot to EOB. Sit>supine w/ supervision. Pt performed 10+ R SLR for therapist to remove lower leg bandages, per RN request. Educated pt on performing these and ankle pumps outside of therapy to facilitate increased blood flow to RLE for wound healing. Both are painful for pt but tolerable and he states he will do them. Ended session in supine, all needs in reach.   Therapy Documentation Precautions:  Precautions Precautions: Fall, Other (comment) Precaution Comments: L BKA with prosthesis; significant scrotal edema Restrictions Weight Bearing Restrictions: No Pain: Pain Assessment Pain Scale: 0-10 Pain Score: 10-Worst pain ever Pain Type: Acute pain Pain Location: Leg Pain Orientation: Right Pain Descriptors / Indicators: Aching;Discomfort Pain Frequency: Constant Pain Onset:  On-going Patients Stated Pain Goal: 5 Pain Intervention(s): Medication (See eMAR);Repositioned  Therapy/Group: Individual Therapy  Kina Shiffman K Dijon Kohlman 03/06/2019, 10:05 AM

## 2019-03-06 NOTE — Progress Notes (Signed)
[place] PHYSICAL MEDICINE & REHABILITATION PROGRESS NOTE   Subjective/Complaints: Up in chair. Concerned that wound dressing hasn't been changed yet. Scrotum feels better.   ROS: Patient denies fever, rash, sore throat, blurred vision, nausea, vomiting, diarrhea, cough, shortness of breath or chest pain, headache, or mood change.   Objective:   Dg Chest 2 View  Result Date: 03/04/2019 CLINICAL DATA:  Severe cough and weakness. EXAM: CHEST - 2 VIEW COMPARISON:  Chest x-rays dated 02/04/2019 and 02/03/2019. FINDINGS: Stable cardiomegaly. LEFT basilar opacity, compatible with the small pleural effusion and passive atelectasis demonstrated on earlier CT abdomen. No new lung findings. RIGHT-sided central catheter is stable in position with tip at the level of the mid/lower SVC. IMPRESSION: Stable chest x-ray. Small LEFT pleural effusion and passive atelectasis, as also demonstrated on earlier CT abdomen. No new lung findings. Stable cardiomegaly. Electronically Signed   By: Franki Cabot M.D.   On: 03/04/2019 11:33   Recent Labs    03/04/19 1013  WBC 12.0*  HGB 8.2*  HCT 27.1*  PLT 270   Recent Labs    03/04/19 1013  NA 130*  K 4.2  CL 95*  CO2 24  GLUCOSE 174*  BUN 26*  CREATININE 4.14*  CALCIUM 8.0*    Intake/Output Summary (Last 24 hours) at 03/06/2019 0844 Last data filed at 03/06/2019 0412 Gross per 24 hour  Intake 240 ml  Output 1 ml  Net 239 ml     Physical Exam: Vital Signs Blood pressure (!) 127/93, pulse 89, temperature 98.3 F (36.8 C), temperature source Oral, resp. rate 18, height 5\' 10"  (1.778 m), weight 126.4 kg, SpO2 100 %. Constitutional: No distress . Vital signs reviewed. HEENT: EOMI, oral membranes moist Neck: supple Cardiovascular: RRR without murmur. No JVD    Respiratory: CTA Bilaterally without wheezes or rales. Normal effort    GI: BS +, non-tender, non-distended  Skin: RLE clean with minimal s/s drainage, dressed Uro: scrotum swelling much  improved Left BKA healed. Psych: Normal mood.  Normal behavior. Musc: Scrotal edema. Psych: still anxious but in better spirits Neurological: Alert. Motor:  RLE: 4-4+/5 proximal to distal, no changes Left lower extremity: Hip flexion, knee extension 4+/5, no change  Assessment/Plan: 1. Functional deficits secondary to debility which require 3+ hours per day of interdisciplinary therapy in a comprehensive inpatient rehab setting.  Physiatrist is providing close team supervision and 24 hour management of active medical problems listed below.  Physiatrist and rehab team continue to assess barriers to discharge/monitor patient progress toward functional and medical goals  Care Tool:  Bathing    Body parts bathed by patient: Right arm, Left arm, Chest, Abdomen, Face   Body parts bathed by helper: Front perineal area, Buttocks, Right upper leg, Left upper leg, Right lower leg Body parts n/a: Left lower leg   Bathing assist Assist Level: Maximal Assistance - Patient 24 - 49%     Upper Body Dressing/Undressing Upper body dressing Upper body dressing/undressing activity did not occur (including orthotics): N/A(patient already had a gown) What is the patient wearing?: Hospital gown only    Upper body assist Assist Level: Total Assistance - Patient < 25%    Lower Body Dressing/Undressing Lower body dressing    Lower body dressing activity did not occur: Refused What is the patient wearing?: Hospital gown only     Lower body assist Assist for lower body dressing: Total Assistance - Patient < 25%     Toileting Toileting Toileting Activity did not occur Arboriculturist  management and hygiene only): Safety/medical concerns  Toileting assist Assist for toileting: Dependent - Patient 0%     Transfers Chair/bed transfer  Transfers assist  Chair/bed transfer activity did not occur: Safety/medical concerns  Chair/bed transfer assist level: Contact Guard/Touching assist      Locomotion Ambulation   Ambulation assist   Ambulation activity did not occur: Safety/medical concerns(unable due to pain and scrotal and bladder issues)          Walk 10 feet activity   Assist  Walk 10 feet activity did not occur: Safety/medical concerns(unable due to pain and scrotal and bladder issues)        Walk 50 feet activity   Assist Walk 50 feet with 2 turns activity did not occur: Safety/medical concerns(unable due to pain and scrotal and bladder issues)         Walk 150 feet activity   Assist Walk 150 feet activity did not occur: Safety/medical concerns(unable due to pain and scrotal and bladder issues)         Walk 10 feet on uneven surface  activity   Assist Walk 10 feet on uneven surfaces activity did not occur: Safety/medical concerns(unable due to pain and scrotal and bladder issues)         Wheelchair     Assist Will patient use wheelchair at discharge?: Yes Type of Wheelchair: Manual Wheelchair activity did not occur: Safety/medical concerns(unable due to pain and scrotal and bladder issues)  Wheelchair assist level: Supervision/Verbal cueing Max wheelchair distance: 40'    Wheelchair 50 feet with 2 turns activity    Assist    Wheelchair 50 feet with 2 turns activity did not occur: Safety/medical concerns(unable due to pain and scrotal and bladder issues)       Wheelchair 150 feet activity     Assist Wheelchair 150 feet activity did not occur: Safety/medical concerns(unable due to pain and scrotal and bladder issues)        Medical Problem List and Plan: 1.Debilitysecondary to end-stage renal disease/multi-medical. Hemodialysis recently initiated.  Cont CIR therapies PT, OT,   2. Antithrombotics: -DVT/anticoagulation:Subcutaneous heparin -antiplatelet therapy: N/A 3. Pain Management:Neurontin 300 mg nightly, Ultram as needed  Overall improved 4. Mood:Celexa 40 mg  daily -antipsychotic agents: N/A  -needs daily ego support 5. Neuropsych: This patientiscapable of making decisions on hisown behalf.  Discussed with neuropsychology-appreciate recs, no SI 6. Skin/Wound Care:Scrotal sling Local care to bullae--examine today when RN changes dressings  Appreciate Vasc recs - nonvascular RLE wounds  Appreciate further WOC recs  Surgery performed bx: "necrotic tissue with acute inflammation" - defer to surgery recs 7. Fluids/Electrolytes/Nutrition:Routine in and outs 8.  ESRD:. Status post AV fistula 01/09/2019. Hemodialysis daily with large volume drawn off  -continue to diurese per nephro Filed Weights   03/04/19 1745 03/05/19 0500 03/06/19 0402  Weight: 121.5 kg 122.7 kg 126.4 kg   weights stable 9. Diabetes mellitus. Recent hemoglobin A1c 5.0.   Controlled on 8/14  Continue to monitor 10. Anemia of chronic disease. Continue Aranesp  Hemoglobin 8.0 on 8/11, recs per nephro, labs with HD 11. History of left BKA 5 years ago in [place].   12. Hypertension. Norvasc 10 mg daily.    Fair control 8/14 13. Morbid obesity. BMI 43.78. Dietary follow-up 14. Hyperlipidemia. Lipitor 15. History of syphilis. Treated outpatient with penicillin to the healthcare department recently. Outpatient follow-up 16. Urine retention: has been voiding, usually incontinent  Foley remains out  Urology following for now.    continues  on keflex for bladder proph  flomax to help with emptying, urecholine 25mg  tid---continue   increased OOB will help with emptying- bladder scans <29ml since 8/13  -continue lidocaine gel for urethral meatus for now 17.  Sleep disturbance  Melatonin 4.5 mg nightly   Cont CPAP 18.  Leukocytosis  WBCs 12K  Afebrile  Continue to monitor 19.  Constipation  Bowel meds increased on 7/28, decreased on 7/31, decreased on 8/5  Multiple bm's-- changed miralax to prn 20.   Pruritus--generally improved  Local care, air to back  Benadryl   Added sarna and eucerin with good results 21.  Hyponatremia as per nephro    LOS: 26 days A FACE TO [place] 03/06/2019, 8:44 AM

## 2019-03-06 NOTE — Plan of Care (Signed)
  Problem: Consults Goal: RH GENERAL PATIENT EDUCATION Description: See Patient Education module for education specifics. Outcome: Progressing   Problem: RH BOWEL ELIMINATION Goal: RH STG MANAGE BOWEL WITH ASSISTANCE Description: STG Manage Bowel with mod Assistance. Outcome: Progressing   Problem: RH SKIN INTEGRITY Goal: RH STG SKIN FREE OF INFECTION/BREAKDOWN Description: Min assist Outcome: Progressing Goal: RH STG MAINTAIN SKIN INTEGRITY WITH ASSISTANCE Description: STG Maintain Skin Integrity With min Assistance. Outcome: Progressing   Problem: RH SAFETY Goal: RH STG ADHERE TO SAFETY PRECAUTIONS W/ASSISTANCE/DEVICE Description: STG Adhere to Safety Precautions With min Assistance/Device. Outcome: Progressing   Problem: RH PAIN MANAGEMENT Goal: RH STG PAIN MANAGED AT OR BELOW PT'S PAIN GOAL Description: Less than 4 on 0-10 scale Outcome: Not Progressing

## 2019-03-07 ENCOUNTER — Ambulatory Visit: Payer: Medicare Other | Admitting: Vascular Surgery

## 2019-03-07 ENCOUNTER — Inpatient Hospital Stay (HOSPITAL_COMMUNITY): Payer: Medicare Other | Admitting: Occupational Therapy

## 2019-03-07 ENCOUNTER — Inpatient Hospital Stay (HOSPITAL_COMMUNITY): Payer: Medicare Other | Admitting: Physical Therapy

## 2019-03-07 ENCOUNTER — Inpatient Hospital Stay (HOSPITAL_COMMUNITY): Payer: Medicare Other

## 2019-03-07 LAB — RENAL FUNCTION PANEL
Albumin: 1.3 g/dL — ABNORMAL LOW (ref 3.5–5.0)
Anion gap: 12 (ref 5–15)
BUN: 25 mg/dL — ABNORMAL HIGH (ref 6–20)
CO2: 20 mmol/L — ABNORMAL LOW (ref 22–32)
Calcium: 6.1 mg/dL — CL (ref 8.9–10.3)
Chloride: 106 mmol/L (ref 98–111)
Creatinine, Ser: 3.83 mg/dL — ABNORMAL HIGH (ref 0.61–1.24)
GFR calc Af Amer: 20 mL/min — ABNORMAL LOW (ref >60)
GFR calc non Af Amer: 18 mL/min — ABNORMAL LOW (ref >60)
Glucose, Bld: 125 mg/dL — ABNORMAL HIGH (ref 70–99)
Phosphorus: 2.2 mg/dL — ABNORMAL LOW (ref 2.5–4.6)
Potassium: 3 mmol/L — ABNORMAL LOW (ref 3.5–5.1)
Sodium: 138 mmol/L (ref 135–145)

## 2019-03-07 LAB — CBC
HCT: 25.4 % — ABNORMAL LOW (ref 39.0–52.0)
Hemoglobin: 7.7 g/dL — ABNORMAL LOW (ref 13.0–17.0)
MCH: 27.7 pg (ref 26.0–34.0)
MCHC: 30.3 g/dL (ref 30.0–36.0)
MCV: 91.4 fL (ref 80.0–100.0)
Platelets: 244 10*3/uL (ref 150–400)
RBC: 2.78 MIL/uL — ABNORMAL LOW (ref 4.22–5.81)
RDW: 14.3 % (ref 11.5–15.5)
WBC: 15.4 10*3/uL — ABNORMAL HIGH (ref 4.0–10.5)
nRBC: 0 % (ref 0.0–0.2)

## 2019-03-07 MED ORDER — TRAMADOL HCL 50 MG PO TABS
ORAL_TABLET | ORAL | Status: AC
  Filled 2019-03-07: qty 1

## 2019-03-07 MED ORDER — POTASSIUM CHLORIDE CRYS ER 20 MEQ PO TBCR
20.0000 meq | EXTENDED_RELEASE_TABLET | Freq: Once | ORAL | Status: AC
  Administered 2019-03-07: 19:00:00 20 meq via ORAL
  Filled 2019-03-07: qty 1

## 2019-03-07 MED ORDER — CALCIUM GLUCONATE-NACL 1-0.675 GM/50ML-% IV SOLN
1.0000 g | Freq: Once | INTRAVENOUS | Status: AC
  Administered 2019-03-07: 1000 mg via INTRAVENOUS
  Filled 2019-03-07: qty 50

## 2019-03-07 MED ORDER — HEPARIN SODIUM (PORCINE) 1000 UNIT/ML IJ SOLN
INTRAMUSCULAR | Status: AC
  Filled 2019-03-07: qty 4

## 2019-03-07 MED ORDER — CHLORHEXIDINE GLUCONATE CLOTH 2 % EX PADS
6.0000 | MEDICATED_PAD | Freq: Every day | CUTANEOUS | Status: DC
  Administered 2019-03-08: 06:00:00 6 via TOPICAL

## 2019-03-07 MED ORDER — DARBEPOETIN ALFA 150 MCG/0.3ML IJ SOSY
PREFILLED_SYRINGE | INTRAMUSCULAR | Status: AC
  Filled 2019-03-07: qty 0.3

## 2019-03-07 MED ORDER — METHOCARBAMOL 500 MG PO TABS
500.0000 mg | ORAL_TABLET | Freq: Three times a day (TID) | ORAL | Status: DC | PRN
  Administered 2019-03-07 – 2019-03-14 (×16): 500 mg via ORAL
  Filled 2019-03-07 (×16): qty 1

## 2019-03-07 MED ORDER — HEPARIN SODIUM (PORCINE) 1000 UNIT/ML IJ SOLN
3.8000 mL | Freq: Once | INTRAMUSCULAR | Status: AC
  Administered 2019-03-07: 3800 [IU] via INTRAVENOUS

## 2019-03-07 MED ORDER — CALCITRIOL 0.25 MCG PO CAPS
0.2500 ug | ORAL_CAPSULE | ORAL | Status: DC
  Administered 2019-03-08 – 2019-03-13 (×3): 0.25 ug via ORAL
  Filled 2019-03-07 (×3): qty 1

## 2019-03-07 NOTE — Progress Notes (Signed)
[facility]    NEPHROLOGY PROGRESS NOTE  SUBJECTIVE: Patient seen and examined. Denies any chest pain, shortness of breath, nausea, vomiting, diarrhea or dysuria.  No other specific complaints noted.  Continues to have significant scrotal and sacral edema limiting movement but reports it is significantly improved.  Scheduled for dialysis today.  Had HD on 8/17 with 5 kg removed.  Per HD SW he may eventually be discharged from rehab to a SNF.   Seen on HD at 2:24 pm with HR 127/78 and HR 88.  Still not able to sit comfortably.  Thinks that another treatment tomorrow would help.    OBJECTIVE:  Vitals:   03/06/19 1916 03/07/19 0613  BP: 130/63 125/79  Pulse: 100 88  Resp: 18 20  Temp: 99.8 F (37.7 C) 98.2 F (36.8 C)  SpO2: 97% 97%    Intake/Output Summary (Last 24 hours) at 03/07/2019 1407 Last data filed at 03/07/2019 1330 Gross per 24 hour  Intake 1060 ml  Output 5000 ml  Net -3940 ml      General:  AAOx3 NAD HEENT: MMM [place] AT anicteric sclera Neck:  Increased circumference  CV:  Heart RRR  Lungs:  Clear to auscultation bilaterally  Abd:  abd SNT/distended obese habitus  GU:  positive scrotal edema Extremities: Left BKA, trace to 1+ bilateral edema, right lower extremity wound bandaged Skin:  No skin rash Access: RIJ tunneled catheter in place; LUE AVF with bruit and thrill   MEDICATIONS:  . sodium chloride   Intravenous Once  . atorvastatin  40 mg Oral Daily  . bethanechol  25 mg Oral TID  . calcitRIOL  0.25 mcg Oral Daily  . Chlorhexidine Gluconate Cloth  6 each Topical Q0600  . citalopram  40 mg Oral Daily  . collagenase   Topical Daily  . darbepoetin (ARANESP) injection - DIALYSIS  150 mcg Intravenous Q Tue-HD  . furosemide  80 mg Oral Daily  . gabapentin  300 mg Oral QHS  . heparin  5,000 Units Subcutaneous Q8H  . hydrocerin   Topical BID  . lidocaine  20 mL Other Once  . lidocaine   Topical 5 X Daily  . Melatonin  4.5 mg Oral QHS  .  pantoprazole  40 mg Oral Daily  . tamsulosin  0.4 mg Oral QPC breakfast       LABS:   CBC Latest Ref Rng & Units 03/07/2019 03/04/2019 03/02/2019  WBC 4.0 - 10.5 K/uL 15.4(H) 12.0(H) 12.6(H)  Hemoglobin 13.0 - 17.0 g/dL 7.7(L) 8.2(L) 7.6(L)  Hematocrit 39.0 - 52.0 % 25.4(L) 27.1(L) 25.6(L)  Platelets 150 - 400 K/uL 244 270 324    CMP Latest Ref Rng & Units 03/04/2019 03/02/2019 03/01/2019  Glucose 70 - 99 mg/dL 174(H) 180(H) 158(H)  BUN 6 - 20 mg/dL 26(H) 23(H) 23(H)  Creatinine 0.61 - 1.24 mg/dL 4.14(H) 4.28(H) 4.49(H)  Sodium 135 - 145 mmol/L 130(L) 129(L) 130(L)  Potassium 3.5 - 5.1 mmol/L 4.2 4.0 3.7  Chloride 98 - 111 mmol/L 95(L) 93(L) 94(L)  CO2 22 - 32 mmol/L 24 24 24   Calcium 8.9 - 10.3 mg/dL 8.0(L) 7.9(L) 8.1(L)  Total Protein 6.5 - 8.1 g/dL - - -  Total Bilirubin 0.3 - 1.2 mg/dL - - -  Alkaline Phos 38 - 126 U/L - - -  AST 15 - 41 U/L - - -  ALT 0 - 44 U/L - - -    Lab Results  Component Value Date   PTH 100 (H) 03/04/2019  CALCIUM 8.0 (L) 03/04/2019   CAION 1.01 (L) 01/30/2019   PHOS 2.2 (L) 03/04/2019       Component Value Date/Time   COLORURINE AMBER (A) 02/13/2019 1325   APPEARANCEUR CLOUDY (A) 02/13/2019 1325   LABSPEC 1.022 02/13/2019 1325   PHURINE 5.0 02/13/2019 1325   GLUCOSEU 150 (A) 02/13/2019 1325   HGBUR LARGE (A) 02/13/2019 1325   BILIRUBINUR NEGATIVE 02/13/2019 1325   KETONESUR NEGATIVE 02/13/2019 1325   PROTEINUR >=300 (A) 02/13/2019 1325   NITRITE NEGATIVE 02/13/2019 1325   LEUKOCYTESUR TRACE (A) 02/13/2019 1325      Component Value Date/Time   PHART 7.221 (L) 01/31/2019 1115   PCO2ART 34.9 01/31/2019 1115   PO2ART 80.0 (L) 01/31/2019 1115   HCO3 13.8 (L) 01/31/2019 1115   TCO2 12 (L) 01/30/2019 2339   ACIDBASEDEF 12.4 (H) 01/31/2019 1115   O2SAT 93.8 01/31/2019 1115       Component Value Date/Time   IRON 19 (L) 03/04/2019 1013   TIBC 199 (L) 03/04/2019 1013   FERRITIN 244 03/04/2019 1013   IRONPCTSAT 10 (L) 03/04/2019 1013        ASSESSMENT/PLAN:    1. ESRD- Initiated HD on 01/31/19 and has been tolerating HD well and will eventually go to [place] [facility] after discharge on a TTS schedule. Confirmed he does have a TTS spot in   1. HD today, 8/18 and plan for UF only treatment on 8/19 as well.  Still has an outpatient TTS schedule as of 8/18.  This may need to change depending on his final dispo plan.  (To SNF etc)  Discussed with HD social worker 2. Will get back on TTS schedule when volume status improved (when swelling has improved for him to sit up in a wheelchair/recliner) 3. Renal panel ordered  2. Volume overload- has significant idwg and scrotaland abdominal edema. Question bed scale and will need to weigh him standing if possible or on hoyer lift. 1. On lasix PO daily  2. Has been getting daily HD/UF  3. He needs to be able to sit up in a wheelchair for outpatient dialysis and he reports that his scrotal edema prevents this, hence aggressive HD/UF. 3. 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 Urology managing.  Optimize volume status as above   4. Anemia of ESRD- h/h stable. On ESA.  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  Decreased calcitriol to 0.25 mcg MWF dosing (from daily).  Phos stable off binder 6. Latent syphilis- per primary svc 7. 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 Wound care consulted. 8. Deconditioning- cont with PT/OT 9. Disposition- pending ability to sit in a chair for prolonged periods of time to facilitate transfer to and from HD from SNF.  If discharged to a SNF will need to ensure that he has transportation to and from HD   Claudia Desanctis 03/07/2019 2:07 PM

## 2019-03-07 NOTE — Plan of Care (Signed)
  Problem: Consults Goal: RH GENERAL PATIENT EDUCATION Description: See Patient Education module for education specifics. 03/07/2019 1547 by Gerald Stabs, RN Outcome: Progressing 03/07/2019 1336 by Gerald Stabs, RN Outcome: Progressing   Problem: RH BOWEL ELIMINATION Goal: RH STG MANAGE BOWEL WITH ASSISTANCE Description: STG Manage Bowel with mod Assistance. 03/07/2019 1547 by Gerald Stabs, RN Outcome: Progressing 03/07/2019 1336 by Gerald Stabs, RN Outcome: Progressing   Problem: RH SKIN INTEGRITY Goal: RH STG SKIN FREE OF INFECTION/BREAKDOWN Description: Min assist 03/07/2019 1547 by Gerald Stabs, RN Outcome: Progressing 03/07/2019 1336 by Gerald Stabs, RN Outcome: Progressing Goal: RH STG MAINTAIN SKIN INTEGRITY WITH ASSISTANCE Description: STG Maintain Skin Integrity With min Assistance. 03/07/2019 1547 by Gerald Stabs, RN Outcome: Progressing 03/07/2019 1336 by Gerald Stabs, RN Outcome: Progressing   Problem: RH SAFETY Goal: RH STG ADHERE TO SAFETY PRECAUTIONS W/ASSISTANCE/DEVICE Description: STG Adhere to Safety Precautions With min Assistance/Device. 03/07/2019 1547 by Gerald Stabs, RN Outcome: Progressing 03/07/2019 1336 by Gerald Stabs, RN Outcome: Progressing   Problem: RH PAIN MANAGEMENT Goal: RH STG PAIN MANAGED AT OR BELOW PT'S PAIN GOAL Description: Less than 4 on 0-10 scale 03/07/2019 1547 by Gerald Stabs, RN Outcome: Progressing 03/07/2019 1336 by Gerald Stabs, RN Outcome: Progressing

## 2019-03-07 NOTE — Progress Notes (Signed)
Occupational Therapy Session Note  Patient Details  Name: Rohit Cisney MRN: [id number] Date of Birth: 10/09/1970  Today's Date: 03/07/2019 OT Individual Time: [id number] OT Individual Time Calculation (min): 40 min    Short Term Goals: Week 4:  OT Short Term Goal 1 (Week 4): Pt will tolerate wearing scrotal edema stockinette for 5 hours during the day OT Short Term Goal 2 (Week 4): Pt will complete grooming tasks from wc at the sink with set-up A OT Short Term Goal 3 (Week 4): Pt will tolerate sitting EOB for 2 mins in preparation for BADL tasks  Skilled Therapeutic Interventions/Progress Updates:    Pt stated he "couldn't" get OOB today because of increased BLE pain this morning (see Pain note below). Pt agreeable to brushing teeth and BUE therex.  Pt provided supplies for washing face and brushing teeth.  Pt issued green theraband and demonstrated exercises-diagonals, chest presses, curls, chest pulls 3X 10 each. Pt remained in bed with all needs within reach and bed alarm activated.  Therapy Documentation Precautions:  Precautions Precautions: Fall, Other (comment) Precaution Comments: L BKA with prosthesis; significant scrotal edema Restrictions Weight Bearing Restrictions: No Pain:   Pt c/o increase BLE pain which was addressed but MD earlier     Therapy/Group: Individual Therapy  Leroy Libman 03/07/2019, 8:48 AM

## 2019-03-07 NOTE — Plan of Care (Signed)
  Problem: Consults Goal: RH GENERAL PATIENT EDUCATION Description: See Patient Education module for education specifics. Outcome: Progressing   Problem: RH BOWEL ELIMINATION Goal: RH STG MANAGE BOWEL WITH ASSISTANCE Description: STG Manage Bowel with mod Assistance. Outcome: Progressing   Problem: RH SKIN INTEGRITY Goal: RH STG SKIN FREE OF INFECTION/BREAKDOWN Description: Min assist Outcome: Progressing Goal: RH STG MAINTAIN SKIN INTEGRITY WITH ASSISTANCE Description: STG Maintain Skin Integrity With min Assistance. Outcome: Progressing   Problem: RH SAFETY Goal: RH STG ADHERE TO SAFETY PRECAUTIONS W/ASSISTANCE/DEVICE Description: STG Adhere to Safety Precautions With min Assistance/Device. Outcome: Progressing   Problem: RH PAIN MANAGEMENT Goal: RH STG PAIN MANAGED AT OR BELOW PT'S PAIN GOAL Description: Less than 4 on 0-10 scale Outcome: Progressing

## 2019-03-07 NOTE — Progress Notes (Signed)
Occupational Therapy Session Note  Patient Details  Name: Michael Riggs MRN: [id number] Date of Birth: 1970-12-04  Today's Date: 03/07/2019 OT Individual Time: [id number] OT Individual Time Calculation (min): 39 min    Short Term Goals: Week 4:  OT Short Term Goal 1 (Week 4): Pt will tolerate wearing scrotal edema stockinette for 5 hours during the day OT Short Term Goal 2 (Week 4): Pt will complete grooming tasks from wc at the sink with set-up A OT Short Term Goal 3 (Week 4): Pt will tolerate sitting EOB for 2 mins in preparation for BADL tasks  Skilled Therapeutic Interventions/Progress Updates:    Pt in bed to start session stating that he cannot get up secondary to increased leg pain and stiffness.  He agreed to work on washing his hair with shampoo cap as well as BUE exercises with medium resistance green therapy band.  Setup for washing his hair in supine with HOB elevated using cap.  He was also able to comb his hair with setup as well.  He then completed 1 set of 15 repetitions for shoulder horizontal abduction as well as elbow flexion and elbow extension bilaterally.  Min instructional cueing for technique.  Finished session with pt still in bed as he declined even transferring to the EOB for any activity.  Call button and phone in reach with safety alarm in place.    Therapy Documentation Precautions:  Precautions Precautions: Fall, Other (comment) Precaution Comments: L BKA with prosthesis; significant scrotal edema Restrictions Weight Bearing Restrictions: No   Pain: Pain Assessment Pain Scale: Faces Faces Pain Scale: Hurts little more Pain Type: Acute pain Pain Location: Leg Pain Orientation: Right Pain Descriptors / Indicators: Discomfort Pain Onset: On-going Pain Intervention(s): Repositioned ADL: See Care Tool for some details of ADL  Therapy/Group: Individual Therapy  Parks Czajkowski OTR/L 03/07/2019, 10:23 AM

## 2019-03-07 NOTE — Progress Notes (Signed)
Received a critical lab value of Ca= 6.1, Nephrologist made aware.

## 2019-03-07 NOTE — Progress Notes (Signed)
[place] PHYSICAL MEDICINE & REHABILITATION PROGRESS NOTE   Subjective/Complaints: In bed. Having leg pain, cramps. Not getting tylenol timed like he wants  ROS: Patient denies fever, rash, sore throat, blurred vision, nausea, vomiting, diarrhea, cough, shortness of breath or chest pain,      Objective:   No results found. Recent Labs    03/04/19 1013 03/07/19 0545  WBC 12.0* 15.4*  HGB 8.2* 7.7*  HCT 27.1* 25.4*  PLT 270 244   Recent Labs    03/04/19 1013  NA 130*  K 4.2  CL 95*  CO2 24  GLUCOSE 174*  BUN 26*  CREATININE 4.14*  CALCIUM 8.0*    Intake/Output Summary (Last 24 hours) at 03/07/2019 0918 Last data filed at 03/07/2019 0800 Gross per 24 hour  Intake 700 ml  Output 5000 ml  Net -4300 ml     Physical Exam: Vital Signs Blood pressure 125/79, pulse 88, temperature 98.2 F (36.8 C), temperature source Oral, resp. rate 20, height 5\' 10"  (1.778 m), weight 122 kg, SpO2 97 %. Constitutional: No distress . Vital signs reviewed. HEENT: EOMI, oral membranes moist Neck: supple Cardiovascular: RRR without murmur. No JVD    Respiratory: CTA Bilaterally without wheezes or rales. Normal effort    GI: BS +, non-tender, non-distended  Skin: RLE clean with ongoing eschar. Mild drainage s/s       Uro: scrotum swelling  improved Left BKA healed. RLE edema resolved Psych: Normal mood.  Normal behavior. Musc: legs tight, tender with PROM. Psych: still anxious but in better spirits Neurological: Alert. Motor:  RLE: 4-4+/5 proximal to distal, no changes Left lower extremity: Hip flexion, knee extension 4+/5==decreased today d/t pain  Assessment/Plan: 1. Functional deficits secondary to debility which require 3+ hours per day of interdisciplinary therapy in a comprehensive inpatient rehab setting.  Physiatrist is providing close team supervision and 24 hour management of active medical problems listed below.  Physiatrist and rehab team continue to assess  barriers to discharge/monitor patient progress toward functional and medical goals  Care Tool:  Bathing    Body parts bathed by patient: Right arm, Left arm, Chest, Abdomen, Face   Body parts bathed by helper: Front perineal area, Buttocks, Right upper leg, Left upper leg, Right lower leg Body parts n/a: Left lower leg   Bathing assist Assist Level: Maximal Assistance - Patient 24 - 49%     Upper Body Dressing/Undressing Upper body dressing Upper body dressing/undressing activity did not occur (including orthotics): N/A(patient already had a gown) What is the patient wearing?: Hospital gown only    Upper body assist Assist Level: Total Assistance - Patient < 25%    Lower Body Dressing/Undressing Lower body dressing    Lower body dressing activity did not occur: Refused What is the patient wearing?: Hospital gown only     Lower body assist Assist for lower body dressing: Total Assistance - Patient < 25%     Toileting Toileting Toileting Activity did not occur Landscape architect and hygiene only): Safety/medical concerns  Toileting assist Assist for toileting: Dependent - Patient 0%     Transfers Chair/bed transfer  Transfers assist  Chair/bed transfer activity did not occur: Safety/medical concerns  Chair/bed transfer assist level: Contact Guard/Touching assist     Locomotion Ambulation   Ambulation assist   Ambulation activity did not occur: Safety/medical concerns(unable due to pain and scrotal and bladder issues)          Walk 10 feet activity   Assist  Walk 10  feet activity did not occur: Safety/medical concerns(unable due to pain and scrotal and bladder issues)        Walk 50 feet activity   Assist Walk 50 feet with 2 turns activity did not occur: Safety/medical concerns(unable due to pain and scrotal and bladder issues)         Walk 150 feet activity   Assist Walk 150 feet activity did not occur: Safety/medical concerns(unable due  to pain and scrotal and bladder issues)         Walk 10 feet on uneven surface  activity   Assist Walk 10 feet on uneven surfaces activity did not occur: Safety/medical concerns(unable due to pain and scrotal and bladder issues)         Wheelchair     Assist Will patient use wheelchair at discharge?: Yes Type of Wheelchair: Manual Wheelchair activity did not occur: Safety/medical concerns(unable due to pain and scrotal and bladder issues)  Wheelchair assist level: Supervision/Verbal cueing Max wheelchair distance: 40'    Wheelchair 50 feet with 2 turns activity    Assist    Wheelchair 50 feet with 2 turns activity did not occur: Safety/medical concerns(unable due to pain and scrotal and bladder issues)       Wheelchair 150 feet activity     Assist Wheelchair 150 feet activity did not occur: Safety/medical concerns(unable due to pain and scrotal and bladder issues)        Medical Problem List and Plan: 1.Debilitysecondary to end-stage renal disease/multi-medical. Hemodialysis recently initiated.  Cont CIR therapies PT, OT  -team conference today, finalizing dc planning--->SNF 2. Antithrombotics: -DVT/anticoagulation:Subcutaneous heparin -antiplatelet therapy: N/A 3. Pain Management:Neurontin 300 mg nightly---at max given ESRD  -Ultram as needed  -Will add low dose robaxin  -encouraged pt to continue working on ROM 4. Mood:Celexa 40 mg daily -antipsychotic agents: N/A  -needs daily ego support 5. Neuropsych: This patientiscapable of making decisions on hisown behalf.  Discussed with neuropsychology-appreciate recs, no SI 6. Skin/Wound Care:Scrotal sling Local care to bullae--examine today when RN changes dressings  Appreciate Vasc recs - nonvascular RLE wounds  Appreciate further WOC recs  Surgery performed bx: "necrotic tissue with acute inflammation" - defer to surgery recs 7.  Fluids/Electrolytes/Nutrition:Routine in and outs 8.  ESRD:. Status post AV fistula 01/09/2019. Hemodialysis daily with large volume drawn off  -continue to diurese per nephro Filed Weights   03/06/19 0402 03/06/19 1313 03/06/19 1724  Weight: 126.4 kg 127.1 kg 122 kg   Weights trending down  -change to T TH S schedule? 9. Diabetes mellitus. Recent hemoglobin A1c 5.0.   Controlled on 8/18  Continue to monitor 10. Anemia of chronic disease. Continue Aranesp  Hemoglobin 8.0 on 8/11, recs per nephro, labs with HD 11. History of left BKA 5 years ago in [place].   12. Hypertension. Norvasc 10 mg daily.    Fair control 8/18 13. Morbid obesity. BMI 43.78. Dietary follow-up 14. Hyperlipidemia. Lipitor 15. History of syphilis. Treated outpatient with penicillin to the healthcare department recently. Outpatient follow-up 16. Urine retention: has been voiding, usually incontinent  Foley remains out  Urology following for now.    continues on keflex for bladder proph  flomax to help with emptying, urecholine 25mg  tid---continue   increased OOB will help with emptying- bladder scans <23ml since 8/13  -continue lidocaine gel for urethral meatus for now 17.  Sleep disturbance  Melatonin 4.5 mg nightly   Cont CPAP 18.  Leukocytosis  WBCs 12K--up to 15k today but no clinical  changes  Afebrile  Continue to monitor 19.  Constipation  Bowel meds increased on 7/28, decreased on 7/31, decreased on 8/5  Multiple bm's-- changed miralax to prn 20.  Pruritus--generally improved  Local care, air to back  Benadryl   Added sarna and eucerin with good results 21.  Hyponatremia as per nephro    LOS: 27 days A FACE TO FACE EVALUATION WAS PERFORMED  Meredith Staggers 03/07/2019, 9:18 AM

## 2019-03-07 NOTE — Progress Notes (Signed)
Physical Therapy Note  Patient Details  Name: Michael Riggs MRN: [id number] Date of Birth: [birth year] Today's Date: 03/07/2019    Pt refusing to participate in PT session, reports that he is in too much pain with activity. Nursing confirms that the patient did received pain medication earlier. Despite encouragement pt continued to refuse any mobility or bed activity. Pt left resting comfortably in bed, all needs in reach and denies any needs.    [name] 03/07/2019, 11:33 AM

## 2019-03-08 ENCOUNTER — Inpatient Hospital Stay (HOSPITAL_COMMUNITY): Payer: Medicare Other | Admitting: Physical Therapy

## 2019-03-08 ENCOUNTER — Inpatient Hospital Stay (HOSPITAL_COMMUNITY): Payer: Medicare Other | Admitting: Occupational Therapy

## 2019-03-08 LAB — RENAL FUNCTION PANEL
Albumin: 1.9 g/dL — ABNORMAL LOW (ref 3.5–5.0)
Anion gap: 11 (ref 5–15)
BUN: 26 mg/dL — ABNORMAL HIGH (ref 6–20)
CO2: 25 mmol/L (ref 22–32)
Calcium: 8.2 mg/dL — ABNORMAL LOW (ref 8.9–10.3)
Chloride: 94 mmol/L — ABNORMAL LOW (ref 98–111)
Creatinine, Ser: 4.04 mg/dL — ABNORMAL HIGH (ref 0.61–1.24)
GFR calc Af Amer: 19 mL/min — ABNORMAL LOW (ref >60)
GFR calc non Af Amer: 16 mL/min — ABNORMAL LOW (ref >60)
Glucose, Bld: 133 mg/dL — ABNORMAL HIGH (ref 70–99)
Phosphorus: 3.1 mg/dL (ref 2.5–4.6)
Potassium: 4.2 mmol/L (ref 3.5–5.1)
Sodium: 130 mmol/L — ABNORMAL LOW (ref 135–145)

## 2019-03-08 LAB — CBC
HCT: 24.6 % — ABNORMAL LOW (ref 39.0–52.0)
Hemoglobin: 7.5 g/dL — ABNORMAL LOW (ref 13.0–17.0)
MCH: 28.8 pg (ref 26.0–34.0)
MCHC: 30.5 g/dL (ref 30.0–36.0)
MCV: 94.6 fL (ref 80.0–100.0)
Platelets: 261 10*3/uL (ref 150–400)
RBC: 2.6 MIL/uL — ABNORMAL LOW (ref 4.22–5.81)
RDW: 14.4 % (ref 11.5–15.5)
WBC: 12.1 10*3/uL — ABNORMAL HIGH (ref 4.0–10.5)
nRBC: 0 % (ref 0.0–0.2)

## 2019-03-08 MED ORDER — ACETAMINOPHEN 325 MG PO TABS
ORAL_TABLET | ORAL | Status: AC
  Filled 2019-03-08: qty 2

## 2019-03-08 MED ORDER — HEPARIN SODIUM (PORCINE) 1000 UNIT/ML IJ SOLN
INTRAMUSCULAR | Status: AC
  Filled 2019-03-08: qty 4

## 2019-03-08 MED ORDER — CHLORHEXIDINE GLUCONATE CLOTH 2 % EX PADS
6.0000 | MEDICATED_PAD | Freq: Every day | CUTANEOUS | Status: DC
  Administered 2019-03-09 – 2019-03-14 (×3): 6 via TOPICAL

## 2019-03-08 NOTE — Progress Notes (Signed)
[place] PHYSICAL MEDICINE & REHABILITATION PROGRESS NOTE   Subjective/Complaints: Up in bed. Legs better but right thigh still sensitive.   ROS: Patient denies fever, rash, sore throat, blurred vision, nausea, vomiting, diarrhea, cough, shortness of breath or chest pain,  headache, or mood change.    Objective:   No results found. Recent Labs    03/07/19 0545  WBC 15.4*  HGB 7.7*  HCT 25.4*  PLT 244   Recent Labs    03/07/19 1422 03/08/19 0549  NA 138 130*  K 3.0* 4.2  CL 106 94*  CO2 20* 25  GLUCOSE 125* 133*  BUN 25* 26*  CREATININE 3.83* 4.04*  CALCIUM 6.1* 8.2*    Intake/Output Summary (Last 24 hours) at 03/08/2019 0800 Last data filed at 03/07/2019 1936 Gross per 24 hour  Intake 480 ml  Output 4250 ml  Net -3770 ml     Physical Exam: Vital Signs Blood pressure (!) 142/80, pulse 97, temperature 99.3 F (37.4 C), temperature source Oral, resp. rate 10, height 5\' 10"  (1.778 m), weight 119.9 kg, SpO2 97 %. Constitutional: No distress . Vital signs reviewed. HEENT: EOMI, oral membranes moist Neck: supple Cardiovascular: RRR without murmur. No JVD    Respiratory: CTA Bilaterally without wheezes or rales. Normal effort    GI: BS +, non-tender, non-distended   Skin: RLE clean with ongoing eschar. Mild drainage s/s only   Uro: scrotum swelling  improved Left BKA healed. RLE edema resolved Psych: Normal mood.  Normal behavior. Musc: muscles more relaxed in legs. Right thigh with sq, indurated area still Psych: anxious Neurological: Alert. Motor:  RLE: 4-4+/5 proximal to distal, no changes Left lower extremity: Hip flexion, knee extension 4+/5==decreased today d/t pain  Assessment/Plan: 1. Functional deficits secondary to debility which require 3+ hours per day of interdisciplinary therapy in a comprehensive inpatient rehab setting.  Physiatrist is providing close team supervision and 24 hour management of active medical problems listed  below.  Physiatrist and rehab team continue to assess barriers to discharge/monitor patient progress toward functional and medical goals  Care Tool:  Bathing    Body parts bathed by patient: Right arm, Left arm, Chest, Abdomen, Face   Body parts bathed by helper: Front perineal area, Buttocks, Right upper leg, Left upper leg, Right lower leg Body parts n/a: Left lower leg   Bathing assist Assist Level: Maximal Assistance - Patient 24 - 49%     Upper Body Dressing/Undressing Upper body dressing Upper body dressing/undressing activity did not occur (including orthotics): N/A(patient already had a gown) What is the patient wearing?: Hospital gown only    Upper body assist Assist Level: Total Assistance - Patient < 25%    Lower Body Dressing/Undressing Lower body dressing    Lower body dressing activity did not occur: Refused What is the patient wearing?: Hospital gown only     Lower body assist Assist for lower body dressing: Total Assistance - Patient < 25%     Toileting Toileting Toileting Activity did not occur Landscape architect and hygiene only): Safety/medical concerns  Toileting assist Assist for toileting: Dependent - Patient 0%     Transfers Chair/bed transfer  Transfers assist  Chair/bed transfer activity did not occur: Safety/medical concerns  Chair/bed transfer assist level: Contact Guard/Touching assist     Locomotion Ambulation   Ambulation assist   Ambulation activity did not occur: Safety/medical concerns(unable due to pain and scrotal and bladder issues)          Walk 10 feet activity  Assist  Walk 10 feet activity did not occur: Safety/medical concerns(unable due to pain and scrotal and bladder issues)        Walk 50 feet activity   Assist Walk 50 feet with 2 turns activity did not occur: Safety/medical concerns(unable due to pain and scrotal and bladder issues)         Walk 150 feet activity   Assist Walk 150 feet  activity did not occur: Safety/medical concerns(unable due to pain and scrotal and bladder issues)         Walk 10 feet on uneven surface  activity   Assist Walk 10 feet on uneven surfaces activity did not occur: Safety/medical concerns(unable due to pain and scrotal and bladder issues)         Wheelchair     Assist Will patient use wheelchair at discharge?: Yes Type of Wheelchair: Manual Wheelchair activity did not occur: Safety/medical concerns(unable due to pain and scrotal and bladder issues)  Wheelchair assist level: Supervision/Verbal cueing Max wheelchair distance: 40'    Wheelchair 50 feet with 2 turns activity    Assist    Wheelchair 50 feet with 2 turns activity did not occur: Safety/medical concerns(unable due to pain and scrotal and bladder issues)       Wheelchair 150 feet activity     Assist Wheelchair 150 feet activity did not occur: Safety/medical concerns(unable due to pain and scrotal and bladder issues)        Medical Problem List and Plan: 1.Debilitysecondary to end-stage renal disease/multi-medical. Hemodialysis recently initiated.  Cont CIR therapies PT, OT  -SNF pending 2. Antithrombotics: -DVT/anticoagulation:Subcutaneous heparin -antiplatelet therapy: N/A 3. Pain Management:Neurontin 300 mg nightly---at max given ESRD  -Ultram as needed  -added low dose robaxin with improved  -encouraged pt to continue working on ROM and that pain will take some time to truly resolve 4. Mood:Celexa 40 mg daily -antipsychotic agents: N/A  -needs daily ego support 5. Neuropsych: This patientiscapable of making decisions on hisown behalf.  Discussed with neuropsychology-appreciate recs, no SI 6. Skin/Wound Care:Scrotal sling Local care to bullae--examine today when RN changes dressings  Appreciate Vasc recs - nonvascular RLE wounds  Appreciate further WOC recs    bx: "necrotic tissue with acute  inflammation"  7. Fluids/Electrolytes/Nutrition:Routine in and outs 8.  ESRD:. Status post AV fistula 01/09/2019. Hemodialysis daily with large volume drawn off  -continue to diurese per nephro Filed Weights   03/07/19 1340 03/07/19 1750 03/08/19 0500  Weight: 123.7 kg 119.4 kg 119.9 kg   Weights trending down  -changed to T TH S schedule as of 8/18  -appreciate nephro help 9. Diabetes mellitus. Recent hemoglobin A1c 5.0.   Controlled on 8/18  Continue to monitor 10. Anemia of chronic disease. Continue Aranesp  Hemoglobin 8.0 on 8/11, recs per nephro, labs with HD 11. History of left BKA 5 years ago in [place].   12. Hypertension. Norvasc 10 mg daily.    Fair control 8/19 13. Morbid obesity. BMI 43.78. Dietary follow-up 14. Hyperlipidemia. Lipitor 15. History of syphilis. Treated outpatient with penicillin to the healthcare department recently. Outpatient follow-up 16. Urine retention: has been voiding, usually incontinent  Foley remains out  Urology following for now.    continues on keflex for bladder proph  flomax to help with emptying, urecholine 25mg  tid---continue   increased OOB will help with emptying- bladder scans <252ml since 8/13  -continue lidocaine gel for urethral meatus for now 17.  Sleep disturbance  Melatonin 4.5 mg nightly  Cont CPAP 18.  Leukocytosis  WBCs 12K--up to 15k today but no clinical changes  Afebrile  Continue to monitor 19.  Constipation  Bowel meds increased on 7/28, decreased on 7/31, decreased on 8/5  Multiple bm's-- changed miralax to prn 20.  Pruritus--generally improved  Local care, air to back  Benadryl   Added sarna and eucerin with good results 21.  Hyponatremia as per nephro    LOS: 28 days A FACE TO [place] 03/08/2019, 8:00 AM

## 2019-03-08 NOTE — Progress Notes (Signed)
Occupational Therapy Session Note  Patient Details  Name: Michael Riggs MRN: [id number] Date of Birth: 04-Dec-1970  Today's Date: 03/08/2019 OT Individual Time: 0921-1001 OT Individual Time Calculation (min): 40 min    Short Term Goals: Week 4:  OT Short Term Goal 1 (Week 4): Pt will tolerate wearing scrotal edema stockinette for 5 hours during the day OT Short Term Goal 2 (Week 4): Pt will complete grooming tasks from wc at the sink with set-up A OT Short Term Goal 3 (Week 4): Pt will tolerate sitting EOB for 2 mins in preparation for BADL tasks  Skilled Therapeutic Interventions/Progress Updates:    Pt still with increased pain in his upper and lower right leg and slight pain in the upper left.  Initially, resistant to sitting EOB but finally agreed to try.  Supervision for transfer from supine to sitting EOB with HOB elevated 30 degrees.  He was able to sit with supervision for approximately 15 mins while engaged in oral hygiene and therapist assist to wash his back.  Transitioned back to supine after donning new gown in order to donn new brief.  Pt was able to roll side to side with supervision in order for therapist to donn new brief.  Slight blood noted on bed sheet from pt scratching his buttocks and upper back.  Pt left in bed with call button and phone in reach with pt back in the bed.    Therapy Documentation Precautions:  Precautions Precautions: Fall, Other (comment) Precaution Comments: L BKA with prosthesis; significant scrotal edema Restrictions Weight Bearing Restrictions: No  Pain: Pain Assessment Pain Scale: Faces Faces Pain Scale: Hurts little more Pain Type: Chronic pain Pain Location: Leg Pain Orientation: Right;Left Pain Descriptors / Indicators: Discomfort Pain Onset: On-going Pain Intervention(s): Repositioned ADL: See Care Tool for some details of ADL  Therapy/Group: Individual Therapy  Karesa Maultsby OTR/L 03/08/2019, 10:02 AM

## 2019-03-08 NOTE — Progress Notes (Signed)
Physical Therapy Session Note  Patient Details  Name: Michael Riggs MRN: [id number] Date of Birth: [birth year]  Today's Date: 03/08/2019 PT Individual Time: 0258-5277 PT Individual Time Calculation (min): 40 min   Short Term Goals: Week 2:  PT Short Term Goal 1 (Week 2): Pt will perform bed<>chair transfer w/ mod assist PT Short Term Goal 1 - Progress (Week 2): Met PT Short Term Goal 2 (Week 2): Pt will perform bed mobility w/ mod assist PT Short Term Goal 2 - Progress (Week 2): Met PT Short Term Goal 3 (Week 2): Pt will participate in 60 min of activity PT Short Term Goal 3 - Progress (Week 2): Met  Skilled Therapeutic Interventions/Progress Updates:    Pt in bed upon arrival, reports that he is still in too much pain to do anything with his LEs or try sitting EOB. Focus was on trunk/UE strengthening. TE: 5lb straight weight bilateral curls, shoulder flexion, overhead press - 2x20, unilateral triceps overhead press 1X20, single arm shoulder press 1X10. Sitting in bed - working on trunk activation with reaching bilaterally - activity included reaching for bean bags and tossing into bin. Performed bilaterally with focus and cues for reaching to maximal range for core activation into flexion and rotation. Following session, pt remaining in bed with all needs in reach. Denies any concerns or needs.   Therapy Documentation Precautions:  Precautions Precautions: Fall, Other (comment) Precaution Comments: L BKA with prosthesis; significant scrotal edema Restrictions Weight Bearing Restrictions: No General:   Vital Signs:  Pain: 9/10 - nursing giving pain meds during session. Monitored throughout.    Therapy/Group: Individual Therapy  [name] 03/08/2019, 12:01 PM

## 2019-03-08 NOTE — Progress Notes (Signed)
[facility]    NEPHROLOGY PROGRESS NOTE  SUBJECTIVE: Patient seen and examined on UF treatment at 15:10 with BP 135/77 and HR 92.  UF decreased to 2 kg as tolerated.  He had hypotension earlier this treatment and UF was decreased.  Had cramping and nausea as well.  Still feels like he has fluid.  States he is able to sit now but does have leg pain.  His primary team is managing.   OBJECTIVE:  Vitals:   03/08/19 1500 03/08/19 1505  BP: (!) 78/56 (!) 92/51  Pulse: 79 92  Resp:    Temp:    SpO2:      Intake/Output Summary (Last 24 hours) at 03/08/2019 1526 Last data filed at 03/08/2019 1300 Gross per 24 hour  Intake 600 ml  Output 4250 ml  Net -3650 ml      General:  AAOx3 NAD HEENT: MMM [place] AT anicteric sclera Neck:  Increased circumference  CV:  Heart RRR  Lungs:  Clear to auscultation bilaterally  Abd:  abd SNT/distended obese habitus  GU:  Has scrotal edema Extremities: Left BKA, trace to 1+ bilateral edema, right lower extremity wound bandaged Skin:  No skin rash Access: RIJ tunneled catheter in place; LUE AVF with bruit and thrill   MEDICATIONS:  . sodium chloride   Intravenous Once  . atorvastatin  40 mg Oral Daily  . bethanechol  25 mg Oral TID  . calcitRIOL  0.25 mcg Oral Q M,W,F-1800  . Chlorhexidine Gluconate Cloth  6 each Topical Q0600  . Chlorhexidine Gluconate Cloth  6 each Topical Q0600  . citalopram  40 mg Oral Daily  . collagenase   Topical Daily  . darbepoetin (ARANESP) injection - DIALYSIS  150 mcg Intravenous Q Tue-HD  . furosemide  80 mg Oral Daily  . gabapentin  300 mg Oral QHS  . heparin  5,000 Units Subcutaneous Q8H  . hydrocerin   Topical BID  . lidocaine  20 mL Other Once  . lidocaine   Topical 5 X Daily  . Melatonin  4.5 mg Oral QHS  . pantoprazole  40 mg Oral Daily  . tamsulosin  0.4 mg Oral QPC breakfast       LABS:   CBC Latest Ref Rng & Units 03/08/2019 03/07/2019 03/04/2019  WBC 4.0 - 10.5 K/uL 12.1(H) 15.4(H)  12.0(H)  Hemoglobin 13.0 - 17.0 g/dL 7.5(L) 7.7(L) 8.2(L)  Hematocrit 39.0 - 52.0 % 24.6(L) 25.4(L) 27.1(L)  Platelets 150 - 400 K/uL 261 244 270    CMP Latest Ref Rng & Units 03/08/2019 03/07/2019 03/04/2019  Glucose 70 - 99 mg/dL 133(H) 125(H) 174(H)  BUN 6 - 20 mg/dL 26(H) 25(H) 26(H)  Creatinine 0.61 - 1.24 mg/dL 4.04(H) 3.83(H) 4.14(H)  Sodium 135 - 145 mmol/L 130(L) 138 130(L)  Potassium 3.5 - 5.1 mmol/L 4.2 3.0(L) 4.2  Chloride 98 - 111 mmol/L 94(L) 106 95(L)  CO2 22 - 32 mmol/L 25 20(L) 24  Calcium 8.9 - 10.3 mg/dL 8.2(L) 6.1(LL) 8.0(L)  Total Protein 6.5 - 8.1 g/dL - - -  Total Bilirubin 0.3 - 1.2 mg/dL - - -  Alkaline Phos 38 - 126 U/L - - -  AST 15 - 41 U/L - - -  ALT 0 - 44 U/L - - -    Lab Results  Component Value Date   PTH 100 (H) 03/04/2019   CALCIUM 8.2 (L) 03/08/2019   CAION 1.01 (L) 01/30/2019   PHOS 3.1 03/08/2019  Component Value Date/Time   COLORURINE AMBER (A) 02/13/2019 1325   APPEARANCEUR CLOUDY (A) 02/13/2019 1325   LABSPEC 1.022 02/13/2019 1325   PHURINE 5.0 02/13/2019 1325   GLUCOSEU 150 (A) 02/13/2019 1325   HGBUR LARGE (A) 02/13/2019 1325   BILIRUBINUR NEGATIVE 02/13/2019 1325   KETONESUR NEGATIVE 02/13/2019 1325   PROTEINUR >=300 (A) 02/13/2019 1325   NITRITE NEGATIVE 02/13/2019 1325   LEUKOCYTESUR TRACE (A) 02/13/2019 1325      Component Value Date/Time   PHART 7.221 (L) 01/31/2019 1115   PCO2ART 34.9 01/31/2019 1115   PO2ART 80.0 (L) 01/31/2019 1115   HCO3 13.8 (L) 01/31/2019 1115   TCO2 12 (L) 01/30/2019 2339   ACIDBASEDEF 12.4 (H) 01/31/2019 1115   O2SAT 93.8 01/31/2019 1115       Component Value Date/Time   IRON 19 (L) 03/04/2019 1013   TIBC 199 (L) 03/04/2019 1013   FERRITIN 244 03/04/2019 1013   IRONPCTSAT 10 (L) 03/04/2019 1013       ASSESSMENT/PLAN:    1. ESRD- Initiated HD on 01/31/19 and has been tolerating HD well and will eventually go to [facility] after discharge on a TTS schedule. Confirmed he  does have a TTS spot in [place]   UF only treatment on 8/19 and then transition off of aggressive UF and back to TTS schedule.  Still has an outpatient TTS spot as of 8/18.  This may need to change depending on his final dispo plan.  (To SNF etc)  Discussed with HD social worker 2. Volume overload- improving; does have scrotaland abdominal edema.  Optimize volume with HD.  On lasix.  3. 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 Urology managing.  Optimize volume status as above   4. Anemia of ESRD- On ESA.  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  Decreased calcitriol to 0.25 mcg MWF dosing (from daily).  Phos stable off binder 6. Latent syphilis- per primary svc 7. 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 Wound care consulted. 8. Deconditioning- cont with PT/OT 9. Disposition- pending ability to sit in a chair for prolonged periods of time to facilitate transfer to and from HD from SNF.  If discharged to a SNF will need to ensure that he has transportation to and from HD   Claudia Desanctis 03/08/2019 3:26 PM

## 2019-03-08 NOTE — NC FL2 (Signed)
[place] LEVEL OF CARE SCREENING TOOL     IDENTIFICATION  Patient Name: Michael Riggs Birthdate: Jun 12, 1971 Sex: male Admission Date (Current Location): 02/08/2019  [place] and [place] Number:  [id number] [place] and Address:  The [place]. [facility], [place] [address], [place], [place] [address]      Provider Number: [id number]  Attending Physician Name and Address:  Meredith Staggers, MD  Relative Name and Phone Number:       Current Level of Care: Other (Comment)(Acute Inpatient Rehabilitation) Recommended Level of Care: [place] Prior Approval Number:    Date Approved/Denied:   PASRR Number: [id number]  Discharge Plan: SNF    Current Diagnoses: Patient Active Problem List   Diagnosis Date Noted  . Pruritus   . Scrotal pain   . Labile blood pressure   . Scrotal edema   . Status post below-knee amputation of left lower extremity ([place])   . Slow transit constipation   . Leukocytosis   . Sleep disturbance   . Essential hypertension   . Anemia of chronic disease   . Controlled type 2 diabetes mellitus with hyperglycemia, without long-term current use of insulin ([place])   . ESRD on dialysis ([place])   . Urinary retention   . Debility 02/09/2019  . Fluid overload 02/04/2019  . Hyperkalemia 01/30/2019  . ARF (acute renal failure) ([place]) 01/30/2019  . Hypokalemia 01/30/2019  . Hypoglycemia 01/30/2019  . Syphilis 01/30/2019  . Macrocytic anemia 01/30/2019  . End stage renal disease ([place])   . PDR (proliferative diabetic retinopathy) ([place]) 12/14/2013  . Venous hypertension 09/22/2013  . Diabetes mellitus type 2 in obese ([place]) 07/04/2013  . Habitual alcohol use 07/04/2013  . Obesity, Class II, BMI 35-39.9, with comorbidity 07/04/2013    Orientation RESPIRATION BLADDER Height & Weight     Self, Time, Situation, Place  Normal Continent(but HD patient) Weight: 266 lb 12.1 oz (121 kg) Height:  5\' 10"  (177.8 cm)   BEHAVIORAL SYMPTOMS/MOOD NEUROLOGICAL BOWEL NUTRITION STATUS      Continent Diet(renal)  AMBULATORY STATUS COMMUNICATION OF NEEDS Skin   Total Care   Other (Comment)(Pt has blisters/dry skin/ulcers on RLE. Daily dressing change with santyl.)                       Personal Care Assistance Level of Assistance  Bathing, Dressing Bathing Assistance: Limited assistance   Dressing Assistance: Limited assistance     Functional Limitations Info             SPECIAL CARE FACTORS FREQUENCY  PT (By licensed PT), OT (By licensed OT)     PT Frequency: 5x/wk OT Frequency: 5x/wk            Contractures Contractures Info: Not present    Additional Factors Info                  Current Medications (03/08/2019):  This is the current hospital active medication list Current Facility-Administered Medications  Medication Dose Route Frequency Provider Last Rate Last Dose  . 0.9 %  sodium chloride infusion (Manually program via Guardrails IV Fluids)   Intravenous Once Elmarie Shiley, MD      . acetaminophen (TYLENOL) tablet 650 mg  650 mg Oral Q6H PRN Cathlyn Parsons, PA-C   650 mg at 03/08/19 [id number]   Or  . acetaminophen (TYLENOL) suppository 650 mg  650 mg Rectal Q6H PRN Cathlyn Parsons, PA-C      .  albuterol (PROVENTIL) (2.5 MG/3ML) 0.083% nebulizer solution 3 mL  3 mL Inhalation Q6H PRN Cathlyn Parsons, PA-C   3 mL at 03/06/19 1423  . atorvastatin (LIPITOR) tablet 40 mg  40 mg Oral Daily Cathlyn Parsons, PA-C   40 mg at 03/08/19 0905  . bethanechol (URECHOLINE) tablet 25 mg  25 mg Oral TID Franchot Gallo, MD   25 mg at 03/08/19 0905  . bisacodyl (DULCOLAX) suppository 10 mg  10 mg Rectal Daily PRN Bayard Hugger, NP   10 mg at 02/14/19 0837  . calcitRIOL (ROCALTROL) capsule 0.25 mcg  0.25 mcg Oral Q M,W,F-1800 Claudia Desanctis, MD      . camphor-menthol [facility]) lotion   Topical PRN Meredith Staggers, MD      . Chlorhexidine Gluconate Cloth 2 % PADS 6 each  6 each  Topical Q0600 Corliss Parish, MD   6 each at 03/08/19 0535  . Chlorhexidine Gluconate Cloth 2 % PADS 6 each  6 each Topical Q0600 Claudia Desanctis, MD   6 each at 03/08/19 0535  . citalopram (CELEXA) tablet 40 mg  40 mg Oral Daily Cathlyn Parsons, PA-C   40 mg at 03/08/19 0905  . collagenase (SANTYL) ointment   Topical Daily Angiulli, Lavon Paganini, PA-C      . Darbepoetin Alfa (ARANESP) injection 150 mcg  150 mcg Intravenous Q Jayme Cloud, MD   150 mcg at 03/07/19 1533  . diphenhydrAMINE-zinc acetate (BENADRYL) 2-0.1 % cream   Topical BID PRN Jamse Arn, MD      . ferric gluconate (NULECIT) 125 mg in sodium chloride 0.9 % 100 mL IVPB  125 mg Intravenous Q M,W,F-HD Finnigan, Nancy A, DO 110 mL/hr at 03/06/19 1600 125 mg at 03/06/19 1600  . furosemide (LASIX) tablet 80 mg  80 mg Oral Daily Finnigan, Nancy A, DO   80 mg at 03/08/19 0905  . gabapentin (NEURONTIN) capsule 300 mg  300 mg Oral QHS Cathlyn Parsons, PA-C   300 mg at 03/07/19 2227  . heparin injection 5,000 Units  5,000 Units Subcutaneous Q8H Cathlyn Parsons, PA-C   5,000 Units at 03/08/19 [id number]  . hydrocerin (EUCERIN) cream   Topical BID Meredith Staggers, MD      . hydrOXYzine (ATARAX/VISTARIL) tablet 25 mg  25 mg Oral Q8H PRN Angiulli, Lavon Paganini, PA-C      . lidocaine (XYLOCAINE) 1 % (with pres) injection 20 mL  20 mL Other Once Tharon Aquas, MD      . lidocaine (XYLOCAINE) 5 % ointment   Topical 5 X Daily Meredith Staggers, MD      . Melatonin TABS 4.5 mg  4.5 mg Oral QHS AngiulliLavon Paganini, PA-C   4.5 mg at 03/07/19 2227  . methocarbamol (ROBAXIN) tablet 500 mg  500 mg Oral Q8H PRN Meredith Staggers, MD   500 mg at 03/08/19 1122  . pantoprazole (PROTONIX) EC tablet 40 mg  40 mg Oral Daily Cathlyn Parsons, PA-C   40 mg at 03/08/19 [id number]  . polyethylene glycol (MIRALAX / GLYCOLAX) packet 17 g  17 g Oral Daily PRN Kirsteins, Luanna Salk, MD      . simethicone [facility]) chewable tablet 80 mg  80 mg Oral Q6H PRN Rexene Agent, MD   80 mg at 02/23/19 0955  . sorbitol 70 % solution 30 mL  30 mL Oral Daily PRN Cathlyn Parsons, PA-C   30 mL at 02/13/19 2044  .  tamsulosin (FLOMAX) capsule 0.4 mg  0.4 mg Oral QPC breakfast Alger Simons T, MD   0.4 mg at 03/08/19 0904  . traMADol (ULTRAM) tablet 50 mg  50 mg Oral Q6H PRN Cathlyn Parsons, PA-C   50 mg at 03/08/19 1120     Discharge Medications: Please see discharge summary for a list of discharge medications.  Relevant Imaging Results:  Relevant Lab Results:   Additional Information significant scrotal and sacral edema limiting movement but it is significantly improved  Mecca Guitron, LCSW

## 2019-03-08 NOTE — Progress Notes (Signed)
Physical Therapy Weekly Progress Note  Patient Details  Name: Michael Riggs MRN: [id number] Date of Birth: Dec 18, 1970  Beginning of progress report period: March 02, 2019 End of progress report period: March 09, 2019  Patient has met 1 of 7 long term goals. Pt has demonstrated improved OOB tolerance over last reporting period as he is now able to tolerate >1 hr sitting in w/c when he can tolerate standing to get to w/c w/ RLE pain. Scrotal and BLE edema has significantly improved, allowing for increased tolerance to upright and 90 deg hip positioning in a chair. He is performing bed mobility w/ supervision, sit<>stands w/ min assist to RW, and CGA-min assist stand pivot transfers with RW. It appears that daily dialysis, combined w/ daily OOB activity and scrotal compression sling use, has assisted w/ edema management and thus has improved his functional independence. He remains mostly limited by RLE pain in WB and in standing.   Patient continues to demonstrate the following deficits muscle weakness and muscle joint tightness, decreased cardiorespiratoy endurance and decreased sitting balance, decreased standing balance, decreased postural control and decreased balance strategies and therefore will continue to benefit from skilled PT intervention to increase functional independence with mobility.  Patient progressing toward long term goals..  Continue plan of care. Pt wishes to pursue SNF instead of d/c to home at this time. Will continue to work towards [place] of supervision overall to decrease burden of care at next facility and to be able to tolerate sitting up in a chair for longer periods of time at outpatient dialysis.   PT Short Term Goals Week 4:  PT Short Term Goal 1 (Week 4): =LTGs due to ELOS Week 5:  PT Short Term Goal 1 (Week 5): =LTGs due to ELOS  [name] [name] 03/09/2019, 7:48 AM

## 2019-03-08 NOTE — Patient Care Conference (Signed)
Inpatient RehabilitationTeam Conference and Plan of Care Update Date: 03/07/2019   Time: 10:35 AM    Patient Name: Michael Riggs      Medical Record Number: [id number]  Date of Birth: 01-08-1971 Sex: Male         Room/Bed: 4M03C/4M03C-01 Payor Info: Payor: Marine scientist / Plan: [employer]HC MEDICARE / Product Type: *No Product type* /    Admitting Diagnosis: 2. ABI Team  Debility, malaise; 17-19days  Admit Date/Time:  02/08/2019 11:54 PM Admission Comments: No comment available   Primary Diagnosis:  <principal problem not specified> Principal Problem: <principal problem not specified>  Patient Active Problem List   Diagnosis Date Noted  . Pruritus   . Scrotal pain   . Labile blood pressure   . Scrotal edema   . Status post below-knee amputation of left lower extremity ([place])   . Slow transit constipation   . Leukocytosis   . Sleep disturbance   . Essential hypertension   . Anemia of chronic disease   . Controlled type 2 diabetes mellitus with hyperglycemia, without long-term current use of insulin ([place])   . ESRD on dialysis ([place])   . Urinary retention   . Debility 02/09/2019  . Fluid overload 02/04/2019  . Hyperkalemia 01/30/2019  . ARF (acute renal failure) ([place]) 01/30/2019  . Hypokalemia 01/30/2019  . Hypoglycemia 01/30/2019  . Syphilis 01/30/2019  . Macrocytic anemia 01/30/2019  . End stage renal disease ([place])   . PDR (proliferative diabetic retinopathy) ([place]) 12/14/2013  . Venous hypertension 09/22/2013  . Diabetes mellitus type 2 in obese ([place]) 07/04/2013  . Habitual alcohol use 07/04/2013  . Obesity, Class II, BMI 35-39.9, with comorbidity 07/04/2013    Expected Discharge Date: Expected Discharge Date: (SNF)  Team Members Present: Physician leading conference: Dr. Alger Simons Social Worker Present: Lennart Pall, LCSW Nurse Present: Mohammed Kindle, RN PT Present: Burnard Bunting, PT OT Present: Cherylynn Ridges, OT SLP Present: Weston Anna, SLP PPS  Coordinator present : Gunnar Fusi, SLP     Current Status/Progress Goal Weekly Team Focus  Medical   swelling better, weight down, skin stable to improved. hd daily  finalize medical mgt for discharge  see medical progress notes   Bowel/Bladder             Swallow/Nutrition/ Hydration             ADL's   Scrotal Edema improved, Min A stand-pivots more consistently with L prosthesis donned, Mod A BADL tasks  Min A wc level  Improved upright tolerance in wc, scrotal edema, stand-pivots, dc planning, general strengthening   Mobility   supervision bed mobility, CGA-min assist stand pivot w/ RW, supervision w/c mobility, tolerating >1 hr in w/c  supervision overall  OOB tolerance, transfers, edema management and education, discharge planning   Communication             Safety/Cognition/ Behavioral Observations            Pain             Skin              Rehab Goals Patient on target to meet rehab goals: Yes *See Care Plan and progress notes for long and short-term goals.     Barriers to Discharge  Current Status/Progress Possible Resolutions Date Resolved   Physician    Medical stability        see medical progress note      Nursing  PT                    OT                  SLP                SW                Discharge Planning/Teaching Needs:  Plan changed  to SNF.  NA   Team Discussion:  Better emptying of bladder;  Swelling decreased but still with daily HD.  Chronic right leg wound.  CG - min for standing and min with ADLs.  Improved overall tolerance for sitting up in w/c and feel he could manage w/c transport van.  SW notes plan changed to SNF.  Revisions to Treatment Plan:  Dc plan changed.    Continued Need for Acute Rehabilitation Level of Care: The patient requires daily medical management by a physician with specialized training in physical medicine and rehabilitation for the following conditions: Daily direction of a  multidisciplinary physical rehabilitation program to ensure safe treatment while eliciting the highest outcome that is of practical value to the patient.: Yes Daily medical management of patient stability for increased activity during participation in an intensive rehabilitation regime.: Yes Daily analysis of laboratory values and/or radiology reports with any subsequent need for medication adjustment of medical intervention for : Neurological problems   I attest that I was present, lead the team conference, and concur with the assessment and plan of the team.   Misty Foutz 03/09/2019, 11:53 AM    Team conference was held via web/ teleconference due to [place] - 19

## 2019-03-09 ENCOUNTER — Inpatient Hospital Stay (HOSPITAL_COMMUNITY): Payer: Medicare Other | Admitting: Physical Therapy

## 2019-03-09 ENCOUNTER — Inpatient Hospital Stay (HOSPITAL_COMMUNITY): Payer: Medicare Other | Admitting: Occupational Therapy

## 2019-03-09 LAB — RENAL FUNCTION PANEL
Albumin: 2 g/dL — ABNORMAL LOW (ref 3.5–5.0)
Anion gap: 12 (ref 5–15)
BUN: 45 mg/dL — ABNORMAL HIGH (ref 6–20)
CO2: 22 mmol/L (ref 22–32)
Calcium: 8.6 mg/dL — ABNORMAL LOW (ref 8.9–10.3)
Chloride: 96 mmol/L — ABNORMAL LOW (ref 98–111)
Creatinine, Ser: 5.92 mg/dL — ABNORMAL HIGH (ref 0.61–1.24)
GFR calc Af Amer: 12 mL/min — ABNORMAL LOW (ref >60)
GFR calc non Af Amer: 10 mL/min — ABNORMAL LOW (ref >60)
Glucose, Bld: 145 mg/dL — ABNORMAL HIGH (ref 70–99)
Phosphorus: 3.9 mg/dL (ref 2.5–4.6)
Potassium: 4.3 mmol/L (ref 3.5–5.1)
Sodium: 130 mmol/L — ABNORMAL LOW (ref 135–145)

## 2019-03-09 MED ORDER — HEPARIN SODIUM (PORCINE) 1000 UNIT/ML DIALYSIS
1000.0000 [IU] | INTRAMUSCULAR | Status: DC | PRN
  Administered 2019-03-11: 3800 [IU] via INTRAVENOUS_CENTRAL

## 2019-03-09 MED ORDER — HEPARIN SODIUM (PORCINE) 1000 UNIT/ML IJ SOLN
INTRAMUSCULAR | Status: AC
  Administered 2019-03-09: 18:00:00
  Filled 2019-03-09: qty 6

## 2019-03-09 MED ORDER — ACETAMINOPHEN 325 MG PO TABS
ORAL_TABLET | ORAL | Status: AC
  Administered 2019-03-09: 19:00:00
  Filled 2019-03-09: qty 2

## 2019-03-09 MED ORDER — SODIUM CHLORIDE 0.9 % IV SOLN: 100.0000 mL | INTRAVENOUS | Status: DC | PRN

## 2019-03-09 MED ORDER — ALTEPLASE 2 MG IJ SOLR: 2.0000 mg | Freq: Once | INTRAMUSCULAR | Status: DC | PRN

## 2019-03-09 MED ORDER — HEPARIN SODIUM (PORCINE) 1000 UNIT/ML DIALYSIS
1000.0000 [IU] | INTRAMUSCULAR | Status: DC | PRN
  Administered 2019-03-09: 18:00:00 1000 [IU] via INTRAVENOUS_CENTRAL

## 2019-03-09 NOTE — Progress Notes (Signed)
[facility]    NEPHROLOGY PROGRESS NOTE  SUBJECTIVE:  Patient had 2.5 kg UF with HD on 8/19.  UF was reduced with hypotension and nausea.  He tried to sit in a chair three days ago and did this for an hour and a half or so.  Limited by scrotal edema and pain; scrotal edema is improving   Review of systems:  Denies current n/v No shortness of breath  Edema improving   OBJECTIVE:  Vitals:   03/09/19 0615 03/09/19 0909  BP: 126/82 118/75  Pulse: 85 84  Resp: 18   Temp: 98.6 F (37 C)   SpO2: 98%     Intake/Output Summary (Last 24 hours) at 03/09/2019 1415 Last data filed at 03/09/2019 1351 Gross per 24 hour  Intake 360 ml  Output 2500 ml  Net -2140 ml      General:  AAOx3 NAD  HEENT: MMM [place] AT anicteric sclera Neck:  Increased circumference  CV:  Heart RRR  Lungs:  Clear to auscultation bilaterally  Abd:  abd SNT/distended obese habitus  GU:  Has scrotal edema Extremities: Left BKA, trace to 1+ edema, right lower extremity wound bandaged Skin:  No skin rash Access: RIJ tunneled catheter in place; LUE AVF with bruit and thrill   MEDICATIONS:  . sodium chloride   Intravenous Once  . atorvastatin  40 mg Oral Daily  . bethanechol  25 mg Oral TID  . calcitRIOL  0.25 mcg Oral Q M,W,F-1800  . Chlorhexidine Gluconate Cloth  6 each Topical Q0600  . Chlorhexidine Gluconate Cloth  6 each Topical Q0600  . Chlorhexidine Gluconate Cloth  6 each Topical Q0600  . citalopram  40 mg Oral Daily  . collagenase   Topical Daily  . darbepoetin (ARANESP) injection - DIALYSIS  150 mcg Intravenous Q Tue-HD  . furosemide  80 mg Oral Daily  . gabapentin  300 mg Oral QHS  . heparin  5,000 Units Subcutaneous Q8H  . hydrocerin   Topical BID  . lidocaine  20 mL Other Once  . lidocaine   Topical 5 X Daily  . Melatonin  4.5 mg Oral QHS  . pantoprazole  40 mg Oral Daily  . tamsulosin  0.4 mg Oral QPC breakfast       LABS:   CBC Latest Ref Rng & Units 03/08/2019 03/07/2019  03/04/2019  WBC 4.0 - 10.5 K/uL 12.1(H) 15.4(H) 12.0(H)  Hemoglobin 13.0 - 17.0 g/dL 7.5(L) 7.7(L) 8.2(L)  Hematocrit 39.0 - 52.0 % 24.6(L) 25.4(L) 27.1(L)  Platelets 150 - 400 K/uL 261 244 270    CMP Latest Ref Rng & Units 03/09/2019 03/08/2019 03/07/2019  Glucose 70 - 99 mg/dL 145(H) 133(H) 125(H)  BUN 6 - 20 mg/dL 45(H) 26(H) 25(H)  Creatinine 0.61 - 1.24 mg/dL 5.92(H) 4.04(H) 3.83(H)  Sodium 135 - 145 mmol/L 130(L) 130(L) 138  Potassium 3.5 - 5.1 mmol/L 4.3 4.2 3.0(L)  Chloride 98 - 111 mmol/L 96(L) 94(L) 106  CO2 22 - 32 mmol/L 22 25 20(L)  Calcium 8.9 - 10.3 mg/dL 8.6(L) 8.2(L) 6.1(LL)  Total Protein 6.5 - 8.1 g/dL - - -  Total Bilirubin 0.3 - 1.2 mg/dL - - -  Alkaline Phos 38 - 126 U/L - - -  AST 15 - 41 U/L - - -  ALT 0 - 44 U/L - - -    Lab Results  Component Value Date   PTH 100 (H) 03/04/2019   CALCIUM 8.6 (L) 03/09/2019   CAION 1.01 (L) 01/30/2019  PHOS 3.9 03/09/2019       Component Value Date/Time   COLORURINE AMBER (A) 02/13/2019 1325   APPEARANCEUR CLOUDY (A) 02/13/2019 1325   LABSPEC 1.022 02/13/2019 1325   PHURINE 5.0 02/13/2019 1325   GLUCOSEU 150 (A) 02/13/2019 1325   HGBUR LARGE (A) 02/13/2019 1325   BILIRUBINUR NEGATIVE 02/13/2019 1325   KETONESUR NEGATIVE 02/13/2019 1325   PROTEINUR >=300 (A) 02/13/2019 1325   NITRITE NEGATIVE 02/13/2019 1325   LEUKOCYTESUR TRACE (A) 02/13/2019 1325      Component Value Date/Time   PHART 7.221 (L) 01/31/2019 1115   PCO2ART 34.9 01/31/2019 1115   PO2ART 80.0 (L) 01/31/2019 1115   HCO3 13.8 (L) 01/31/2019 1115   TCO2 12 (L) 01/30/2019 2339   ACIDBASEDEF 12.4 (H) 01/31/2019 1115   O2SAT 93.8 01/31/2019 1115       Component Value Date/Time   IRON 19 (L) 03/04/2019 1013   TIBC 199 (L) 03/04/2019 1013   FERRITIN 244 03/04/2019 1013   IRONPCTSAT 10 (L) 03/04/2019 1013       ASSESSMENT/PLAN:    1. ESRD- Initiated HD on 01/31/19 and has been tolerating HD well and will eventually go to [facility]  after discharge on a TTS schedule. Confirmed he does have a TTS spot in [place] as of 8/18 - Transition back to his routine TTS schedule  This may need to change depending on his final dispo plan.  (I.e. if goes to SNF etc)  Discussed with HD social worker 2. Volume overload- improving; does have scrotaland abdominal edema.  Optimize volume with HD.  On lasix.  S/p several sessions of aggressive UF and we are now at or approaching his EDW 3. 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 Urology managing.  Optimize volume status as above   4. Anemia of ESRD- On ESA.  Iron with dialysis x 5 doses.  No acute indication for PRBC's.  5. 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  Decreased calcitriol to 0.25 mcg MWF dosing (from daily).  Phos stable off binder 6. Latent syphilis- per primary svc 7. 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 Wound care consulted. 8. Deconditioning- cont with PT/OT 9. Disposition- pending ability to sit in a chair for prolonged periods of time to facilitate transfer to and from HD from SNF.  He is going to work on sitting in a chair.  If discharged to a SNF will need to ensure that he has transportation to and from HD   Claudia Desanctis 03/09/2019 2:15 PM

## 2019-03-09 NOTE — Progress Notes (Signed)
Occupational Therapy Session Note  Patient Details  Name: Michael Riggs MRN: [id number] Date of Birth: 1970-11-12  Today's Date: 03/09/2019 OT Individual Time: [id number] OT Individual Time Calculation (min): 54 min    Short Term Goals: Week 4:  OT Short Term Goal 1 (Week 4): Pt will tolerate wearing scrotal edema stockinette for 5 hours during the day OT Short Term Goal 2 (Week 4): Pt will complete grooming tasks from wc at the sink with set-up A OT Short Term Goal 3 (Week 4): Pt will tolerate sitting EOB for 2 mins in preparation for BADL tasks  Skilled Therapeutic Interventions/Progress Updates:    Pt in bed to start session sleeping.  Increased drowsiness noted as pt would start talking and then drift off to sleep.  He reports just getting muscle relaxer around 8:30, but therapist did not confirm this with nursing.  Pt declining to transfer or try sitting EOB but agreeable to completion of BUE strengthening exercises with HOB elevated.  Pt completed 2 sets of 15 repetitions of each of the exercises listed below with mod instructional cueing for technique.  Noted pt falling asleep at times during completion of exercises.  Pt left in the bed with call button and phone in reach and safety bed alarm in place.    Therapy Documentation Precautions:  Precautions Precautions: Fall, Other (comment) Precaution Comments: L BKA with prosthesis; significant scrotal edema Restrictions Weight Bearing Restrictions: No   Vital Signs: Therapy Vitals Temp: 98.6 F (37 C) Temp Source: Oral Pulse Rate: 84 Resp: 18 BP: 118/75 Patient Position (if appropriate): Lying Oxygen Therapy SpO2: 98 % O2 Device: Room Air Pain: Pain Assessment Pain Scale: Faces Faces Pain Scale: Hurts little more Pain Type: Acute pain Pain Location: Leg Pain Orientation: Right;Left Pain Descriptors / Indicators: Discomfort;Grimacing Pain Onset: On-going Pain Intervention(s): Emotional  support  Exercises: General Exercises - Upper Extremity Shoulder Flexion: Strengthening;15 reps;Both;Supine;Theraband Theraband Level (Shoulder Flexion): Level 3 (Green) Elbow Flexion: Strengthening;Both;15 reps;Theraband Theraband Level (Elbow Flexion): Level 3 (Green) Elbow Extension: Strengthening;Both;15 reps;Supine;Theraband Theraband Level (Elbow Extension): Level 3 (Green) Shoulder Exercises Shoulder Extension: Strengthening;Both;15 reps;Supine;Theraband Theraband Level (Shoulder Extension): Level 3 (Green) Shoulder External Rotation: Strengthening;Both;15 reps;Supine;Theraband Theraband Level (Shoulder External Rotation): Level 3 (Green) Other Treatments:     Therapy/Group: Individual Therapy  Henri Guedes OTR/L 03/09/2019, 9:55 AM

## 2019-03-09 NOTE — Progress Notes (Signed)
[facility] PHYSICAL MEDICINE & REHABILITATION PROGRESS NOTE   Subjective/Complaints: Had a fair night. Still with leg pain. Emptying bladder with bm's. Saying that he might want to try to go home now  ROS: Patient denies fever, rash, sore throat, blurred vision, nausea, vomiting, diarrhea, cough, shortness of breath or chest pain,  headache, or mood change.     Objective:   No results found. Recent Labs    03/07/19 0545 03/08/19 1410  WBC 15.4* 12.1*  HGB 7.7* 7.5*  HCT 25.4* 24.6*  PLT 244 261   Recent Labs    03/07/19 1422 03/08/19 0549  NA 138 130*  K 3.0* 4.2  CL 106 94*  CO2 20* 25  GLUCOSE 125* 133*  BUN 25* 26*  CREATININE 3.83* 4.04*  CALCIUM 6.1* 8.2*    Intake/Output Summary (Last 24 hours) at 03/09/2019 0710 Last data filed at 03/08/2019 1647 Gross per 24 hour  Intake 480 ml  Output 2500 ml  Net -2020 ml     Physical Exam: Vital Signs Blood pressure 126/81, pulse 94, temperature 98.8 F (37.1 C), temperature source Oral, resp. rate 19, height 5\' 10"  (1.778 m), weight 119 kg, SpO2 98 %. Constitutional: No distress . Vital signs reviewed. HEENT: EOMI, oral membranes moist Neck: supple Cardiovascular: RRR without murmur. No JVD    Respiratory: CTA Bilaterally without wheezes or rales. Normal effort    GI: BS +, non-tender, non-distended   Skin: RLE clean with ongoing eschar. Slow improvement   Uro: scrotum swelling  improved Left BKA healed. RLE edema resolved Psych: Normal mood.  Normal behavior. Musc: muscles more relaxed in legs. Right thigh with sq, indurated area still Psych: anxious Neurological: Alert. Motor:  RLE: 4-4+/5 proximal to distal, no changes Left lower extremity: Hip flexion, knee extension 4+/5==decreased today d/t pain  Assessment/Plan: 1. Functional deficits secondary to debility which require 3+ hours per day of interdisciplinary therapy in a comprehensive inpatient rehab setting.  Physiatrist is providing close team  supervision and 24 hour management of active medical problems listed below.  Physiatrist and rehab team continue to assess barriers to discharge/monitor patient progress toward functional and medical goals  Care Tool:  Bathing    Body parts bathed by patient: Right arm, Left arm, Chest, Abdomen, Face   Body parts bathed by helper: Front perineal area, Buttocks, Right upper leg, Left upper leg, Right lower leg Body parts n/a: Left lower leg   Bathing assist Assist Level: Maximal Assistance - Patient 24 - 49%     Upper Body Dressing/Undressing Upper body dressing Upper body dressing/undressing activity did not occur (including orthotics): N/A(patient already had a gown) What is the patient wearing?: Hospital gown only    Upper body assist Assist Level: Total Assistance - Patient < 25%    Lower Body Dressing/Undressing Lower body dressing    Lower body dressing activity did not occur: Refused What is the patient wearing?: Hospital gown only, Incontinence brief     Lower body assist Assist for lower body dressing: Moderate Assistance - Patient 50 - 74%     Toileting Toileting Toileting Activity did not occur Landscape architect and hygiene only): Safety/medical concerns  Toileting assist Assist for toileting: Dependent - Patient 0%     Transfers Chair/bed transfer  Transfers assist  Chair/bed transfer activity did not occur: Safety/medical concerns  Chair/bed transfer assist level: Contact Guard/Touching assist     Locomotion Ambulation   Ambulation assist   Ambulation activity did not occur: Safety/medical concerns(unable due to  pain and scrotal and bladder issues)          Walk 10 feet activity   Assist  Walk 10 feet activity did not occur: Safety/medical concerns(unable due to pain and scrotal and bladder issues)        Walk 50 feet activity   Assist Walk 50 feet with 2 turns activity did not occur: Safety/medical concerns(unable due to pain and  scrotal and bladder issues)         Walk 150 feet activity   Assist Walk 150 feet activity did not occur: Safety/medical concerns(unable due to pain and scrotal and bladder issues)         Walk 10 feet on uneven surface  activity   Assist Walk 10 feet on uneven surfaces activity did not occur: Safety/medical concerns(unable due to pain and scrotal and bladder issues)         Wheelchair     Assist Will patient use wheelchair at discharge?: Yes Type of Wheelchair: Manual Wheelchair activity did not occur: Safety/medical concerns(unable due to pain and scrotal and bladder issues)  Wheelchair assist level: Supervision/Verbal cueing Max wheelchair distance: 40'    Wheelchair 50 feet with 2 turns activity    Assist    Wheelchair 50 feet with 2 turns activity did not occur: Safety/medical concerns(unable due to pain and scrotal and bladder issues)       Wheelchair 150 feet activity     Assist Wheelchair 150 feet activity did not occur: Safety/medical concerns(unable due to pain and scrotal and bladder issues)        Medical Problem List and Plan: 1.Debilitysecondary to end-stage renal disease/multi-medical. Hemodialysis recently initiated.  Cont CIR therapies PT, OT  -SNF is now pending, told him that if he plans to go home he would need to have a plan in place for care 2. Antithrombotics: -DVT/anticoagulation:Subcutaneous heparin -antiplatelet therapy: N/A 3. Pain Management:Neurontin 300 mg nightly---at max given ESRD  -Ultram as needed  -continue low dose robaxin prn   - pt to continue working on ROM on own and with therapy 4. Mood:Celexa 40 mg daily -antipsychotic agents: N/A  -needs daily ego support 5. Neuropsych: This patientiscapable of making decisions on hisown behalf.  Discussed with neuropsychology-appreciate recs, no SI 6. Skin/Wound Care:Scrotal sling Local care to bullae--examine today  when RN changes dressings  Appreciate Vasc recs - nonvascular RLE wounds  Appreciate further WOC recs    bx: "necrotic tissue with acute inflammation"  7. Fluids/Electrolytes/Nutrition:Routine in and outs 8.  ESRD:. Status post AV fistula 01/09/2019.   Filed Weights   03/08/19 1647 03/09/19 0004 03/09/19 0500  Weight: 120 kg 121 kg 119 kg   Weights trending down 8/20  -changed to T TH S schedule as of 8/18  -appreciate nephro help 9. Diabetes mellitus. Recent hemoglobin A1c 5.0.   Controlled    Continue to monitor 10. Anemia of chronic disease. Continue Aranesp  Hemoglobin 8.0 on 8/11, recs per nephro, labs with HD 11. History of left BKA 5 years ago in [place].   12. Hypertension. Norvasc 10 mg daily.    Fair control 8/20 13. Morbid obesity. BMI 43.78. Dietary follow-up 14. Hyperlipidemia. Lipitor 15. History of syphilis. Treated outpatient with penicillin to the healthcare department recently. Outpatient follow-up 16. Urine retention:    -now voiding, usually incontinently with bm  -flomax to help with emptying, urecholine 25mg  tid     17.  Sleep disturbance  Melatonin 4.5 mg nightly   Cont CPAP 18.  Leukocytosis  WBCs 12K as of 8/19  Afebrile  Continue to monitor 19.  Constipation   large bm this morning  -   miralax prn 20.  Pruritus--generally improved  Local care, air to back  Benadryl   Added sarna and eucerin with good results 21.  Hyponatremia as per nephro    LOS: 29 days A FACE TO [place] 03/09/2019, 7:10 AM

## 2019-03-09 NOTE — Progress Notes (Signed)
Urology Treatment Plan Note:  Patient has been voiding multiple times a day with post void residuals now less than 172mls (as of 8/17).  Urology Plan: - Continue tamsulosin - Continue bethanechol - Patient should call [facility] for follow up 1 month after discharge.

## 2019-03-09 NOTE — Progress Notes (Addendum)
Occupational Therapy Weekly Progress Note  Patient Details  Name: Michael Riggs MRN: [id number] Date of Birth: 1971/07/04  Beginning of progress report period: February 09, 2019 End of progress report period: March 09, 2019   Patient has met 1 of 4 long term goals.  Short term goals not set due to estimated length of stay.  Pt made progress in the beginning of the week and was transferring OOB to wc more consistently with min A, RW and LLE prosthesis. Scrotal edema had greatly improved as well, which allowed pt to transfer with less pain and tolerate sitting in wc for over an hour at a time. However, in the last few days, pt has refused to transfer OOB 2/2 burning pain in R LE now. Continuing to work towards goals and hopeful pt can dc to SNF for continued OT services in next venue of care.  Patient continues to demonstrate the following deficits: muscle weakness, decreased cardiorespiratoy endurance and decreased sitting balance, decreased standing balance, decreased postural control and decreased balance strategies and therefore will continue to benefit from skilled OT intervention to enhance overall performance with BADL and Reduce care partner burden.  See Patient's Care Plan for progression toward long term goals.    Therapy/Group: Individual Therapy  Valma Cava 03/09/2019, 12:52 PM

## 2019-03-09 NOTE — Progress Notes (Signed)
Physical Therapy Session Note  Patient Details  Name: Michael Riggs MRN: [id number] Date of Birth: [birth year]  Today's Date: 03/09/2019 PT Individual Time: 1120-1129 PT Individual Time Calculation (min): 9 min   Short Term Goals: Week 3:  PT Short Term Goal 1 (Week 3): =LTGs due to ELOS  Skilled Therapeutic Interventions/Progress Updates:   Pt received supine in bed, asleep. Aroused with effort from PT. Pt refusing PT reporting extreme pain in BLE, mid thigh LLE and ankle and foot in the LLE. PT offered pt to perform UE or LE at bed level for pain control and continued strengthening, but pt continued to decline. PT then instructed pt in scooting to [facility] with heavy use of BUE and cues for improved use of LE as tolerated. Throughout conversation, pt noted to be falling asleep mid sentence, but aroused easily each time. Pt left in bed with call bell in reach and all needs met.          Therapy Documentation Precautions:  Precautions Precautions: Fall, Other (comment) Precaution Comments: L BKA with prosthesis; significant scrotal edema Restrictions Weight Bearing Restrictions: No General: PT Amount of Missed Time (min): 36 Minutes PT Missed Treatment Reason: Patient unwilling to participate Vital Signs: Therapy Vitals Temp: 97.9 F (36.6 C) Temp Source: Oral Pulse Rate: 85 Resp: 18 BP: 120/72 Patient Position (if appropriate): Lying Oxygen Therapy SpO2: 98 % O2 Device: Room Air Pain: Pain Assessment Pain Scale: 0-10 Pain Score: Asleep    Therapy/Group: Individual Therapy  Lorie Phenix 03/09/2019, 5:26 PM

## 2019-03-09 NOTE — Plan of Care (Signed)
  Problem: RH Balance Goal: LTG: Patient will maintain dynamic sitting balance (OT) Description: LTG:  Patient will maintain dynamic sitting balance with assistance during activities of daily living (OT) Outcome: Completed/Met   Problem: RH Toileting Goal: LTG Patient will perform toileting task (3/3 steps) with assistance level (OT) Description: LTG: Patient will perform toileting task (3/3 steps) with assistance level (OT)  Outcome: Not Applicable Flowsheets (Taken 03/09/2019 1250) LTG: Pt will perform toileting task (3/3 steps) with assistance level: (dc goal 8/20) -- Note: D/c goal 2/2 lack of progress -ESD 8/20   Problem: RH Toilet Transfers Goal: LTG Patient will perform toilet transfers w/assist (OT) Description: LTG: Patient will perform toilet transfers with assist, with/without cues using equipment (OT) Outcome: Not Applicable Flowsheets (Taken 03/09/2019 1250) LTG: Pt will perform toilet transfers with assistance level of: (dc goal 8/20) -- Note: D/c goal 8/20 2/2 lack of progress and motivation-ESD

## 2019-03-10 ENCOUNTER — Inpatient Hospital Stay (HOSPITAL_COMMUNITY): Payer: Medicare Other | Admitting: Occupational Therapy

## 2019-03-10 ENCOUNTER — Inpatient Hospital Stay (HOSPITAL_COMMUNITY): Payer: Medicare Other

## 2019-03-10 ENCOUNTER — Inpatient Hospital Stay (HOSPITAL_COMMUNITY): Payer: Medicare Other | Admitting: Physical Therapy

## 2019-03-10 LAB — RENAL FUNCTION PANEL
Albumin: 1.9 g/dL — ABNORMAL LOW (ref 3.5–5.0)
Anion gap: 12 (ref 5–15)
BUN: 32 mg/dL — ABNORMAL HIGH (ref 6–20)
CO2: 24 mmol/L (ref 22–32)
Calcium: 8.3 mg/dL — ABNORMAL LOW (ref 8.9–10.3)
Chloride: 93 mmol/L — ABNORMAL LOW (ref 98–111)
Creatinine, Ser: 4.8 mg/dL — ABNORMAL HIGH (ref 0.61–1.24)
GFR calc Af Amer: 16 mL/min — ABNORMAL LOW
GFR calc non Af Amer: 13 mL/min — ABNORMAL LOW
Glucose, Bld: 136 mg/dL — ABNORMAL HIGH (ref 70–99)
Phosphorus: 3.2 mg/dL (ref 2.5–4.6)
Potassium: 4.1 mmol/L (ref 3.5–5.1)
Sodium: 129 mmol/L — ABNORMAL LOW (ref 135–145)

## 2019-03-10 LAB — URINALYSIS, COMPLETE (UACMP) WITH MICROSCOPIC
Bilirubin Urine: NEGATIVE
Glucose, UA: 50 mg/dL — AB
Ketones, ur: NEGATIVE mg/dL
Nitrite: NEGATIVE
Protein, ur: >=300 mg/dL — AB
Specific Gravity, Urine: 1.02 (ref 1.005–1.030)
WBC, UA: >50 WBC/hpf — ABNORMAL HIGH (ref 0–5)
pH: 5 (ref 5.0–8.0)

## 2019-03-10 MED ORDER — CHLORHEXIDINE GLUCONATE CLOTH 2 % EX PADS
6.0000 | MEDICATED_PAD | Freq: Every day | CUTANEOUS | Status: DC
  Administered 2019-03-11 – 2019-03-12 (×2): 6 via TOPICAL

## 2019-03-10 MED ORDER — SODIUM CHLORIDE 0.9 % IV SOLN
1.0000 g | INTRAVENOUS | Status: DC
  Administered 2019-03-10 – 2019-03-11 (×2): 1 g via INTRAVENOUS
  Filled 2019-03-10: qty 1
  Filled 2019-03-10: qty 10
  Filled 2019-03-10: qty 1

## 2019-03-10 NOTE — Progress Notes (Signed)
Renal Navigator received call from CIR CSW/L. Hoyle who reports plan for patient to discharge to [facility] in [place] from [facility]. She reports he will need a MWF OP HD schedule in order for SNF to accommodate transportation to appointments. Renal Navigator informed Nephrologist/Dr. Royce Macadamia of change in schedule and sent message to OP HD clinic/[facility] for request to change to MWF schedule due to change in disposition. Renal Navigator spoke with CSW, Nephrologist, and HD unit Charge RN regarding patient's need to complete HD treatments in the hospital in a recliner to ensure he can tolerate this prior to discharge since this is necessary to be appropriate for treatment in the outpatient setting. All in agreement that patient will trial all upcoming inpt HD treatments in a recliner to ensure tolerance prior to discharge. Patient also needs to be able to sit in wheelchair in order for transport from SNF to OP HD clinic. Renal Navigator to follow closely.  Alphonzo Cruise, [place] Renal Navigator [phone]

## 2019-03-10 NOTE — Plan of Care (Signed)
  Problem: Consults Goal: RH GENERAL PATIENT EDUCATION Description: See Patient Education module for education specifics. Outcome: Progressing   Problem: RH BOWEL ELIMINATION Goal: RH STG MANAGE BOWEL WITH ASSISTANCE Description: STG Manage Bowel with mod Assistance. Outcome: Progressing   Problem: RH SKIN INTEGRITY Goal: RH STG SKIN FREE OF INFECTION/BREAKDOWN Description: Min assist Outcome: Progressing Goal: RH STG MAINTAIN SKIN INTEGRITY WITH ASSISTANCE Description: STG Maintain Skin Integrity With min Assistance. Outcome: Progressing   Problem: RH SAFETY Goal: RH STG ADHERE TO SAFETY PRECAUTIONS W/ASSISTANCE/DEVICE Description: STG Adhere to Safety Precautions With min Assistance/Device. Outcome: Progressing   Problem: RH PAIN MANAGEMENT Goal: RH STG PAIN MANAGED AT OR BELOW PT'S PAIN GOAL Description: Less than 4 on 0-10 scale Outcome: Not Progressing pain level at 8-10 range even with pain meds.

## 2019-03-10 NOTE — Progress Notes (Signed)
Occupational Therapy Session Note  Patient Details  Name: Michael Riggs MRN: [id number] Date of Birth: 09/24/1970  Today's Date: 03/10/2019 OT Individual Time: [id number] OT Individual Time Calculation (min): 41 min    Short Term Goals: Week 4:  OT Short Term Goal 1 (Week 4): Pt will tolerate wearing scrotal edema stockinette for 5 hours during the day OT Short Term Goal 2 (Week 4): Pt will complete grooming tasks from wc at the sink with set-up A OT Short Term Goal 3 (Week 4): Pt will tolerate sitting EOB for 2 mins in preparation for BADL tasks  Skilled Therapeutic Interventions/Progress Updates:    Pt with limited participation in therapy session secondary to increased bilateral LE pain in his medial upper legs.  He continues to delcline sitting EOB or attempted transfer OOB via stand pivot or sliding board.  He agreed to try to get OOB with use of the Maximove.  When attempted, he was unable to tolerate the pressure on his legs.  Finished session with pt rolling side to side with supervision to remove sling as well as slightly soiled brief.  Max assist for toilet hygiene and for donning new brief as well.  Pt left in the bed with call button and phone in reach.  Discussed pt's current level of progress and lack of ability to get OOB with SW.    Therapy Documentation Precautions:  Precautions Precautions: Fall Precaution Comments: L BKA with prosthesis; significant scrotal edema Restrictions Weight Bearing Restrictions: No  Pain: Pain Assessment Pain Scale: Faces Faces Pain Scale: Hurts little more Pain Type: Acute pain Pain Location: Leg Pain Orientation: Right;Left Pain Descriptors / Indicators: Discomfort;Penetrating Pain Onset: With Activity Pain Intervention(s): Repositioned;Emotional support   Therapy/Group: Individual Therapy  Abdulaziz Toman OTR/L 03/10/2019, 12:15 PM

## 2019-03-10 NOTE — Progress Notes (Signed)
[facility]    NEPHROLOGY PROGRESS NOTE  SUBJECTIVE:  Patient is to be discharged ultimately to SNF per HD SW.  He had 1.2 kg UF on 8/20 with HD.  Still difficult to sit in a chair due to leg pain.  Scrotal edema is better.  His leg pain is better today than yesterday.   Review of systems:  Denies current n/v Some shortness of breath  Edema improving overall  BM today    OBJECTIVE:  Vitals:   03/10/19 0434 03/10/19 1428  BP: 140/81 126/73  Pulse: 94 89  Resp: 18 18  Temp: (!) 100.4 F (38 C) 98.5 F (36.9 C)  SpO2: 100% 96%    Intake/Output Summary (Last 24 hours) at 03/10/2019 1551 Last data filed at 03/10/2019 1330 Gross per 24 hour  Intake 960 ml  Output 1331 ml  Net -371 ml      General:  AAOx3 NAD  HEENT: MMM [place] AT anicteric sclera Neck:  Increased neck circumference  CV:  Heart RRR  Lungs:  Clear to auscultation bilaterally  Abd:  abd SNT/distended obese habitus  GU:  Has scrotal edema Extremities: Left BKA, trace to 1+ edema, right lower extremity wound bandaged Skin:  No skin rash Access: RIJ tunneled catheter in place; LUE AVF with bruit and thrill   MEDICATIONS:  . sodium chloride   Intravenous Once  . atorvastatin  40 mg Oral Daily  . bethanechol  25 mg Oral TID  . calcitRIOL  0.25 mcg Oral Q M,W,F-1800  . Chlorhexidine Gluconate Cloth  6 each Topical Q0600  . Chlorhexidine Gluconate Cloth  6 each Topical Q0600  . Chlorhexidine Gluconate Cloth  6 each Topical Q0600  . citalopram  40 mg Oral Daily  . collagenase   Topical Daily  . darbepoetin (ARANESP) injection - DIALYSIS  150 mcg Intravenous Q Tue-HD  . furosemide  80 mg Oral Daily  . gabapentin  300 mg Oral QHS  . heparin  5,000 Units Subcutaneous Q8H  . hydrocerin   Topical BID  . lidocaine  20 mL Other Once  . lidocaine   Topical 5 X Daily  . Melatonin  4.5 mg Oral QHS  . pantoprazole  40 mg Oral Daily  . tamsulosin  0.4 mg Oral QPC breakfast       LABS:   CBC  Latest Ref Rng & Units 03/08/2019 03/07/2019 03/04/2019  WBC 4.0 - 10.5 K/uL 12.1(H) 15.4(H) 12.0(H)  Hemoglobin 13.0 - 17.0 g/dL 7.5(L) 7.7(L) 8.2(L)  Hematocrit 39.0 - 52.0 % 24.6(L) 25.4(L) 27.1(L)  Platelets 150 - 400 K/uL 261 244 270    CMP Latest Ref Rng & Units 03/10/2019 03/09/2019 03/08/2019  Glucose 70 - 99 mg/dL 136(H) 145(H) 133(H)  BUN 6 - 20 mg/dL 32(H) 45(H) 26(H)  Creatinine 0.61 - 1.24 mg/dL 4.80(H) 5.92(H) 4.04(H)  Sodium 135 - 145 mmol/L 129(L) 130(L) 130(L)  Potassium 3.5 - 5.1 mmol/L 4.1 4.3 4.2  Chloride 98 - 111 mmol/L 93(L) 96(L) 94(L)  CO2 22 - 32 mmol/L 24 22 25   Calcium 8.9 - 10.3 mg/dL 8.3(L) 8.6(L) 8.2(L)  Total Protein 6.5 - 8.1 g/dL - - -  Total Bilirubin 0.3 - 1.2 mg/dL - - -  Alkaline Phos 38 - 126 U/L - - -  AST 15 - 41 U/L - - -  ALT 0 - 44 U/L - - -    Lab Results  Component Value Date   PTH 100 (H) 03/04/2019   CALCIUM 8.3 (L)  03/10/2019   CAION 1.01 (L) 01/30/2019   PHOS 3.2 03/10/2019       Component Value Date/Time   COLORURINE YELLOW 03/10/2019 1054   APPEARANCEUR TURBID (A) 03/10/2019 1054   LABSPEC 1.020 03/10/2019 1054   PHURINE 5.0 03/10/2019 1054   GLUCOSEU 50 (A) 03/10/2019 1054   HGBUR SMALL (A) 03/10/2019 1054   BILIRUBINUR NEGATIVE 03/10/2019 1054   [place] 03/10/2019 1054   PROTEINUR >=300 (A) 03/10/2019 1054   NITRITE NEGATIVE 03/10/2019 1054   LEUKOCYTESUR LARGE (A) 03/10/2019 1054      Component Value Date/Time   PHART 7.221 (L) 01/31/2019 1115   PCO2ART 34.9 01/31/2019 1115   PO2ART 80.0 (L) 01/31/2019 1115   HCO3 13.8 (L) 01/31/2019 1115   TCO2 12 (L) 01/30/2019 2339   ACIDBASEDEF 12.4 (H) 01/31/2019 1115   O2SAT 93.8 01/31/2019 1115       Component Value Date/Time   IRON 19 (L) 03/04/2019 1013   TIBC 199 (L) 03/04/2019 1013   FERRITIN 244 03/04/2019 1013   IRONPCTSAT 10 (L) 03/04/2019 1013       ASSESSMENT/PLAN:    ESRD- Initiated HD on 01/31/19.  Had been on a TTS schedule here then increased  to daily HD to optimize volume -He is to be discharged ultimately to SNF and has now been set up at [facility] on MWF schedule.  - HD on 8/22 per recent TTS schedule then transition to MWF for a treatment on 8/24  1. Fever - noted.  Blood cultures obtained x 2.  UTI suggested per UA.  Start ceftriaxone - defer to primary if alternate agent preferred.  Culture pending. CBC in AM on 8/22  2. Volume overload- improving; does have scrotaland abdominal edema.  Optimizing volume with HD.  On lasix.  S/p several sessions of aggressive UF and we are now at or approaching his EDW 3. 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 Urology managing.  Optimize volume status as above   4. Anemia of ESRD- On ESA.  Iron with dialysis x 5 doses.  No acute indication for PRBC's.  5. 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  Decreased calcitriol to 0.25 mcg MWF dosing (from daily).  Phos stable off binder 6. Latent syphilis- per primary svc 7. 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 Wound care consulted. 8. Deconditioning- cont with PT/OT 9. Disposition- pending ability to sit in a chair for prolonged periods of time to facilitate transfer to and from HD from SNF.  He is going to work on sitting in a chair.  If discharged to a SNF will need to ensure that he has transportation to and from HD   Claudia Desanctis 03/10/2019 3:51 PM

## 2019-03-10 NOTE — Progress Notes (Signed)
Physical Therapy Discharge Summary  Patient Details  Name: Michael Riggs MRN: [id number] Date of Birth: [birth year]  Patient has met 4 of 7 long term goals due to improved activity tolerance, increased strength, increased range of motion, ability to compensate for deficits and functional use of  right lower extremity and left lower extremity.  Patient to discharge at a wheelchair level Supervision for mobility and min assist for transfers.    Reasons goals not met: Pt w/ limited OOB tolerance during this admission 2/2 significant scrotal edema and BLE limiting his participation in therapies.   Recommendation:  Patient will benefit from ongoing skilled PT services in skilled nursing facility setting to continue to advance safe functional mobility, address ongoing impairments in functional endurance and strength, OOB tolerance, and , and minimize fall risk.  Equipment: No equipment provided  Reasons for discharge: treatment goals met and discharge from hospital  Patient/family agrees with progress made and goals achieved: Yes   SESSION:  Pt refusing any activity today, reporting that he is leaving and wants to rest. Despite encouragement, pt continuing to refuse all mobility. Pt denies any questions or concerns.   PT Discharge Precautions/Restrictions Precautions Precautions: (P) Fall Restrictions Weight Bearing Restrictions: (P) No   Pain Reports no pain at rest.  Vision/Perception    WFL Cognition Overall Cognitive Status: (P) Within Functional Limits for tasks assessed Sensation Sensation Light Touch: (P) Appears Intact Coordination Gross Motor Movements are Fluid and Coordinated: (P) Yes Motor  Motor Motor: (P) Within Functional Limits  Mobility Bed Mobility Bed Mobility: (P) Rolling Right;Rolling Left Rolling Right: (P) Supervision/verbal cueing Rolling Left: (P) Supervision/Verbal cueing Right Sidelying to Sit: (P) Minimal Assistance - Patient > 75% Sit to  Supine: (P) Supervision/Verbal cueing Transfers Transfers: (P) Sit to Stand;Stand Pivot Transfers Sit to Stand: (P) Minimal Assistance - Patient > 75% Stand Pivot Transfers: (P) Minimal Assistance - Patient > 75% Transfer (Assistive device): (P) Rolling walker  Trunk/Postural Assessment  Postural Control Postural Control: (P) Within Functional Limits  Balance   Extremity Assessment  RLE Assessment General Strength Comments: (P) (Pt refusing testing) LLE Assessment General Strength Comments: (P) (Pt refusing testing)    [name], PT, CSCS 03/14/2019, 3:45 PM

## 2019-03-10 NOTE — Progress Notes (Signed)
Social Work [name], male   DOB: Jun 19, 1971, 48 y.o.   MRN: [id number]  Have received  SNF bed offer today from [place] for possible admit on Monday.  Have alerted therapies, MD/ PA and have spoken with HD SW who will alert renal MD.  Must be on a M-W-F HD schedule to accommodate transportation service.  HD SW requests that our unit insure he is in a recliner for all remaining HD txs here to make sure he can tolerate the OP set up.  Will alert nursing.  Pt aware of all this info and will alert his family.  Facility to begin process of insurance authorization.  Milford Cilento, LCSW

## 2019-03-10 NOTE — Progress Notes (Signed)
Physical Therapy Note  Patient Details  Name: Michael Riggs MRN: [id number] Date of Birth: 1971/02/06 Today's Date: 03/10/2019    Pt refusing any therapy at this time. Various options offered to modify activity and exercise but all options refused. Pt reports that his legs hurt when trying to move. States he is in no pain at rest though. Discussed option of using sliding board to get to chair to minimize LE weightbearing. Pt verbalized understand but continued to refuse participation. Pt left in bed with all need in reach.    [name] 03/10/2019, 11:25 AM

## 2019-03-10 NOTE — Progress Notes (Signed)
Renal Navigator received notification from OP HD clinic/[facility] that they can accommodate patient on a MWF schedule with a seat time of 9:45am/9:30am arrival while patient is in SNF. Renal Navigator faxed updated hospital records to [facility] Admissions. Patient will need to sign new consents for OP HD treatment since his previous consents will be greater than 21 days old by the time he arrives for outpatient treatment. He will need to arrive to the clinic on his first day of treatment at 8:30am to sign intake paperwork.  Alphonzo Cruise, [place] Renal Navigator [phone]

## 2019-03-10 NOTE — Progress Notes (Signed)
Pt noted with temp of 100.4 this morning. C/o pain to lower right leg-given tylenol and robaxin this morning-resting quietly upon reassessment. Dan Angiulli-PA notified. No new orders noted.

## 2019-03-10 NOTE — Progress Notes (Signed)
[place] PHYSICAL MEDICINE & REHABILITATION PROGRESS NOTE   Subjective/Complaints: Says right leg still sensitive. Low grade temp this morning 100.4.   ROS: Patient denies fever, rash, sore throat, blurred vision, nausea, vomiting, diarrhea, cough, shortness of breath or chest pain,  headache, or mood change.    Objective:   No results found. Recent Labs    03/08/19 1410  WBC 12.1*  HGB 7.5*  HCT 24.6*  PLT 261   Recent Labs    03/09/19 0824 03/10/19 0631  NA 130* 129*  K 4.3 4.1  CL 96* 93*  CO2 22 24  GLUCOSE 145* 136*  BUN 45* 32*  CREATININE 5.92* 4.80*  CALCIUM 8.6* 8.3*    Intake/Output Summary (Last 24 hours) at 03/10/2019 0827 Last data filed at 03/10/2019 0748 Gross per 24 hour  Intake 840 ml  Output 1231 ml  Net -391 ml     Physical Exam: Vital Signs Blood pressure 140/81, pulse 94, temperature (!) 100.4 F (38 C), temperature source Oral, resp. rate 18, height 5\' 10"  (1.778 m), weight 119.5 kg, SpO2 100 %. Constitutional: No distress . Vital signs reviewed. HEENT: EOMI, oral membranes moist Neck: supple Cardiovascular: RRR without murmur. No JVD    Respiratory: CTA Bilaterally without wheezes or rales. Normal effort    GI: BS +, non-tender, non-distended   Skin: RLE wound was just dressed prior to me coming in. Did not remove dressing.  Uro: scrotum swelling  improved Left BKA healed. RLE edema essentially resolved Psych: Normal mood.  Normal behavior. Musc:  . Right thigh with sq, indurated area still esp medially. Right leg really was not overly sensitive to touch Psych: anxious Neurological: Alert. Motor:  LLE: 4-4+/5 proximal to distal, no changes Right lower extremity: Hip flexion, knee extension 2/5---distally 3/5==decreased still somewhat d/t pain  Assessment/Plan: 1. Functional deficits secondary to debility which require 3+ hours per day of interdisciplinary therapy in a comprehensive inpatient rehab setting.  Physiatrist is  providing close team supervision and 24 hour management of active medical problems listed below.  Physiatrist and rehab team continue to assess barriers to discharge/monitor patient progress toward functional and medical goals  Care Tool:  Bathing    Body parts bathed by patient: Right arm, Left arm, Chest, Abdomen, Face   Body parts bathed by helper: Front perineal area, Buttocks, Right upper leg, Left upper leg, Right lower leg Body parts n/a: Left lower leg   Bathing assist Assist Level: Maximal Assistance - Patient 24 - 49%     Upper Body Dressing/Undressing Upper body dressing Upper body dressing/undressing activity did not occur (including orthotics): N/A(patient already had a gown) What is the patient wearing?: Hospital gown only    Upper body assist Assist Level: Total Assistance - Patient < 25%    Lower Body Dressing/Undressing Lower body dressing    Lower body dressing activity did not occur: Refused What is the patient wearing?: Hospital gown only, Incontinence brief     Lower body assist Assist for lower body dressing: Moderate Assistance - Patient 50 - 74%     Toileting Toileting Toileting Activity did not occur Landscape architect and hygiene only): Safety/medical concerns  Toileting assist Assist for toileting: Dependent - Patient 0%     Transfers Chair/bed transfer  Transfers assist  Chair/bed transfer activity did not occur: Safety/medical concerns  Chair/bed transfer assist level: Contact Guard/Touching assist     Locomotion Ambulation   Ambulation assist   Ambulation activity did not occur: Safety/medical concerns(unable due to  pain and scrotal and bladder issues)          Walk 10 feet activity   Assist  Walk 10 feet activity did not occur: Safety/medical concerns(unable due to pain and scrotal and bladder issues)        Walk 50 feet activity   Assist Walk 50 feet with 2 turns activity did not occur: Safety/medical  concerns(unable due to pain and scrotal and bladder issues)         Walk 150 feet activity   Assist Walk 150 feet activity did not occur: Safety/medical concerns(unable due to pain and scrotal and bladder issues)         Walk 10 feet on uneven surface  activity   Assist Walk 10 feet on uneven surfaces activity did not occur: Safety/medical concerns(unable due to pain and scrotal and bladder issues)         Wheelchair     Assist Will patient use wheelchair at discharge?: Yes Type of Wheelchair: Manual Wheelchair activity did not occur: Safety/medical concerns(unable due to pain and scrotal and bladder issues)  Wheelchair assist level: Supervision/Verbal cueing Max wheelchair distance: 40'    Wheelchair 50 feet with 2 turns activity    Assist    Wheelchair 50 feet with 2 turns activity did not occur: Safety/medical concerns(unable due to pain and scrotal and bladder issues)       Wheelchair 150 feet activity     Assist Wheelchair 150 feet activity did not occur: Safety/medical concerns(unable due to pain and scrotal and bladder issues)        Medical Problem List and Plan: 1.Debilitysecondary to end-stage renal disease/multi-medical. Hemodialysis recently initiated.  Cont CIR therapies PT, OT  -SNF placement still pending 2. Antithrombotics: -DVT/anticoagulation:Subcutaneous heparin -antiplatelet therapy: N/A 3. Pain Management:Neurontin 300 mg nightly---at max given ESRD  -Ultram as needed  -continue low dose robaxin prn   - pt to continue working on ROM on own and with therapy 4. Mood:Celexa 40 mg daily -antipsychotic agents: N/A  -needs daily ego support 5. Neuropsych: This patientiscapable of making decisions on hisown behalf.  Discussed with neuropsychology-appreciate recs, no SI 6. Skin/Wound Care:Scrotal sling Local care to bullae--examine today when RN changes dressings  Appreciate Vasc  recs - nonvascular RLE wounds  Appreciate further WOC recs    bx: "necrotic tissue with acute inflammation"  7. Fluids/Electrolytes/Nutrition:Routine in and outs 8.  ESRD:. Status post AV fistula 01/09/2019.   Filed Weights   03/09/19 1425 03/09/19 1800 03/10/19 0434  Weight: 120.7 kg 119.8 kg 119.5 kg   Weights trending down 8/20  -changed to T TH S schedule as of 8/18  -appreciate nephro help 9. Diabetes mellitus. Recent hemoglobin A1c 5.0.   Controlled    Continue to monitor 10. Anemia of chronic disease. Continue Aranesp  Hemoglobin 8.0 on 8/11, recs per nephro, labs with HD 11. History of left BKA 5 years ago in [place].   12. Hypertension. Norvasc 10 mg daily.    Fair control 8/21 13. Morbid obesity. BMI 43.78. Dietary follow-up 14. Hyperlipidemia. Lipitor 15. History of syphilis. Treated outpatient with penicillin to the healthcare department recently. Outpatient follow-up 16. Urine retention:    -now voiding, usually incontinently with bm  -flomax to help with emptying, urecholine 25mg  tid   -check ua ucx  17.  Sleep disturbance  Melatonin 4.5 mg nightly   Cont CPAP 18.  Leukocytosis  WBCs 12K as of 8/19  Low grade temp today  -check ua,ucx  -lungs sound  clear, no changes in skin 19.  Constipation   moving bowels daily  - miralax prn 20.  Pruritus--generally improved  Local care, air to back  Benadryl   Added sarna and eucerin with good results 21.  Hyponatremia as per nephro    LOS: 30 days A FACE TO [place] 03/10/2019, 8:27 AM

## 2019-03-10 NOTE — Progress Notes (Signed)
Occupational Therapy Session Note  Patient Details  Name: Ebenezer Bruinsma MRN: [id number] Date of Birth: [birth year]  Today's Date: 03/10/2019 OT Individual Time: 1045-1110 OT Individual Time Calculation (min): 25 min    Short Term Goals: Week 4:  OT Short Term Goal 1 (Week 4): Pt will tolerate wearing scrotal edema stockinette for 5 hours during the day OT Short Term Goal 2 (Week 4): Pt will complete grooming tasks from wc at the sink with set-up A OT Short Term Goal 3 (Week 4): Pt will tolerate sitting EOB for 2 mins in preparation for BADL tasks  Skilled Therapeutic Interventions/Progress Updates:    Pt resting in bed upon arrival.  Pt states his BLE "hurt too much" to move them.  Pt agreed to BUE therex whle supine with 5# bar-chest presses 3x10, curls 3x10; and batting ball 3x20. Pt required increased rest breaks during session.  Pt remained in bed with all needs within reach and bed alarm activated.   Therapy Documentation Precautions:  Precautions Precautions: Fall Precaution Comments: L BKA with prosthesis; significant scrotal edema Restrictions Weight Bearing Restrictions: No  Pain:  Pt c/o increased BLE discomfort; RN admin meds prior to therapy   Therapy/Group: Individual Therapy  Leroy Libman 03/10/2019, 12:58 PM

## 2019-03-11 ENCOUNTER — Inpatient Hospital Stay (HOSPITAL_COMMUNITY): Payer: Medicare Other | Admitting: Occupational Therapy

## 2019-03-11 ENCOUNTER — Inpatient Hospital Stay (HOSPITAL_COMMUNITY): Payer: Medicare Other

## 2019-03-11 LAB — CBC
HCT: 25 % — ABNORMAL LOW (ref 39.0–52.0)
Hemoglobin: 7.5 g/dL — ABNORMAL LOW (ref 13.0–17.0)
MCH: 27.9 pg (ref 26.0–34.0)
MCHC: 30 g/dL (ref 30.0–36.0)
MCV: 92.9 fL (ref 80.0–100.0)
Platelets: 333 10*3/uL (ref 150–400)
RBC: 2.69 MIL/uL — ABNORMAL LOW (ref 4.22–5.81)
RDW: 15 % (ref 11.5–15.5)
WBC: 13 10*3/uL — ABNORMAL HIGH (ref 4.0–10.5)
nRBC: 0 % (ref 0.0–0.2)

## 2019-03-11 LAB — RENAL FUNCTION PANEL
Albumin: 2 g/dL — ABNORMAL LOW (ref 3.5–5.0)
Anion gap: 14 (ref 5–15)
BUN: 53 mg/dL — ABNORMAL HIGH (ref 6–20)
CO2: 22 mmol/L (ref 22–32)
Calcium: 8.3 mg/dL — ABNORMAL LOW (ref 8.9–10.3)
Chloride: 90 mmol/L — ABNORMAL LOW (ref 98–111)
Creatinine, Ser: 6.64 mg/dL — ABNORMAL HIGH (ref 0.61–1.24)
GFR calc Af Amer: 10 mL/min — ABNORMAL LOW (ref >60)
GFR calc non Af Amer: 9 mL/min — ABNORMAL LOW (ref >60)
Glucose, Bld: 123 mg/dL — ABNORMAL HIGH (ref 70–99)
Phosphorus: 4.1 mg/dL (ref 2.5–4.6)
Potassium: 4.4 mmol/L (ref 3.5–5.1)
Sodium: 126 mmol/L — ABNORMAL LOW (ref 135–145)

## 2019-03-11 MED ORDER — TRAMADOL HCL 50 MG PO TABS
ORAL_TABLET | ORAL | Status: AC
  Administered 2019-03-11: 19:00:00 50 mg via ORAL
  Filled 2019-03-11: qty 1

## 2019-03-11 MED ORDER — HEPARIN SODIUM (PORCINE) 1000 UNIT/ML IJ SOLN
INTRAMUSCULAR | Status: AC
  Administered 2019-03-11: 19:00:00 3800 [IU] via INTRAVENOUS_CENTRAL
  Filled 2019-03-11: qty 4

## 2019-03-11 NOTE — Progress Notes (Signed)
Occupational Therapy Session Note  Patient Details  Name: Michael Riggs MRN: [id number] Date of Birth: [birth year]  Today's Date: 03/11/2019 OT Individual Time: 0815-0900 OT Individual Time Calculation (min): 45 min  and Today's Date: 03/11/2019 OT Missed Time: 15 Minutes Missed Time Reason: Pain  Short Term Goals: Week 5:  OT Short Term Goal 1 (Week 5): STG=LTG 2/2 ELOS  Skilled Therapeutic Interventions/Progress Updates:    Pt greeted semi-reclined in bed watching TV on his computer. Pt reported high pain in R LE. He says it is a burning, stabbing pain all of the time and hurts even more when he puts pressure on it. Pt adamantly refuses to get OOB. Majority of session spent discussing dc plan. OT discussed the need for pt to be able to tolerate sitting in recliner and being able to transfer to wc to get to dialysis, and then to the recliner once he gets to dialysis. Pt stated that he feels he can still transfer to recliner with min A using his RW and L prosthesis, even though he refuses to do it with therapy. Explained importance of practicing this transfer prior, but he stated he will only do the transfer to get to dialysis and back. He says he will only do it if he HAS to and does not want to practice 2/2 pain. Pt feels tolerating sitting up in recliner for dialysis will not be an issue now as his scrotal edema is much improved. UB there ex-3 sets of 15 triceps press, bicep curls, and chest pull using level 3 green theraband. Pt eventually agreeable to try to at least stand with next therapy.   Therapy Documentation Precautions:  Precautions Precautions: Fall Precaution Comments: L BKA with prosthesis; significant scrotal edema Restrictions Weight Bearing Restrictions: No General: General OT Amount of Missed Time: 15 Minutes Pain: Pain Assessment Pain Scale: 0-10 Pain Score: 8 Pain Type: Acute pain Pain Location: Leg Pain Orientation: Right Pain Descriptors / Indicators:  Stabbing Pain Frequency: Intermittent Pain Onset: On-going Pain Intervention(s): Repositioned   Therapy/Group: Individual Therapy  Valma Cava 03/11/2019, 9:19 AM

## 2019-03-11 NOTE — Progress Notes (Signed)
[employer] PHYSICAL MEDICINE & REHABILITATION PROGRESS NOTE   Subjective/Complaints: Continues with some right leg discomfort.  It is worse with physical therapy.  May be slightly improving.   Objective:   No results found. Recent Labs    03/08/19 1410 03/11/19 0645  WBC 12.1* 13.0*  HGB 7.5* 7.5*  HCT 24.6* 25.0*  PLT 261 333   Recent Labs    03/10/19 0631 03/11/19 0645  NA 129* 126*  K 4.1 4.4  CL 93* 90*  CO2 24 22  GLUCOSE 136* 123*  BUN 32* 53*  CREATININE 4.80* 6.64*  CALCIUM 8.3* 8.3*     Physical Exam: Vital Signs Blood pressure 129/83, pulse 92, temperature 98.1 F (36.7 C), temperature source Oral, resp. rate 18, height 5\' 10"  (1.778 m), weight 121.6 kg, SpO2 98 %.  Morbidly obese male in no acute distress. HEENT exam atraumatic, normocephalic, extraocular muscles are intact Chest is clear to auscultation Cardiac exam S1 and S2 are regular Abdominal exam overweight, active bowel sounds, soft, nontender. Extremities: Right lower extremity below the knee is wrapped.  There are clear signs of venous insufficiency and hyperpigmentation of the right lower extremity.  Left lower extremity with BKA.  There is no significant right lower extremity edema. Assessment/Plan: 1. Functional deficits secondary to debility    Medical Problem List and Plan: 1.Debilitysecondary to end-stage renal disease/multi-medical. Hemodialysis recently initiated.  Cont CIR therapies PT, OT  -SNF placement still pending 2. Antithrombotics: -DVT/anticoagulation:Subcutaneous heparin -antiplatelet therapy: N/A 3. Pain Management: Continue current pain management.  He does describe some right lower extremity sensitivity it is likely that that discomfort will not be really resolved with more medications. 4. Mood: Continue antidepressants. 5. Neuropsych: This patientiscapable of making decisions on hisown behalf.  Discussed with neuropsychology-appreciate recs, no  SI 6. Skin/Wound Care:Continue current therapies. 7. Fluids/Electrolytes/Nutrition: Basic Metabolic Panel:    Component Value Date/Time   NA 126 (L) 03/11/2019 0645   K 4.4 03/11/2019 0645   CL 90 (L) 03/11/2019 0645   CO2 22 03/11/2019 0645   BUN 53 (H) 03/11/2019 0645   CREATININE 6.64 (H) 03/11/2019 0645   GLUCOSE 123 (H) 03/11/2019 0645   CALCIUM 8.3 (L) 03/11/2019 0645   Electrolytes followed and managed by nephrology. 8.  ESRD:. Status post AV fistula 01/09/2019.   Filed Weights   03/09/19 1800 03/10/19 0434 03/11/19 0504  Weight: 119.8 kg 119.5 kg 121.6 kg    9. Diabetes mellitus. Recent hemoglobin A1c 5.0.   Controlled    Continue to monitor  10. Anemia of chronic disease- ESRD. Continue Aranesp  Hemoglobin 8.0 on 8/11, recs per nephro, labs with HD 11. History of left BKA 5 years ago in [place].   12. Hypertension. Norvasc 10 mg daily.    Fair control 8/21 13. Morbid obesity. BMI 43.78. Discussed.  He has multiple fatty, sugary snacks on his table. 14. Hyperlipidemia. Lipitor 15. History of syphilis. Treated outpatient with penicillin to the healthcare department recently. Outpatient follow-up 16. Urine retention:    Resolved. 17.  Sleep disturbance  Continue melatonin.  Continue CPAP.  Patient has obstructive sleep apnea. 18.  Leukocytosis Low-grade fever resolved. 19.  Constipation  Resolved. 20.  Pruritus--patient denies 21.  Hyponatremia as per nephro    LOS: 31 days A FACE TO FACE EVALUATION WAS PERFORMED  Michael Riggs H Rourke Mcquitty 03/11/2019, 11:13 AM

## 2019-03-11 NOTE — Progress Notes (Signed)
Physical Therapy Session Note  Patient Details  Name: Michael Riggs MRN: [id number] Date of Birth: [birth year]  Today's Date: 03/11/2019 PT Individual Time: 1100-1129 PT Individual Time Calculation (min): 29 min   Short Term Goals: Week 4:  PT Short Term Goal 1 (Week 4): =LTGs due to ELOS  Skilled Therapeutic Interventions/Progress Updates:   Pt states " I just defecated and was going to call the nurse."  Assisted pt with hygiene with total assist and pt performed rolling to R and L with supervision using bedrails. Pt declining any OOB activity or exercise due to "my leg being so messed up, don't you know!" education provided on importance of OOB due to HD schedule and overall activity tolerance/strengthening and health but just continues to provide various reasons as to why he can't participate. Finally agreeable to sit up to EOB and noted the sheet was wet, so with significant persuasion, performed min assist sit <> stand with RW (total assist to don L prosthetic) while NT assisted with changing bed linen. Pt declined anything further including placement of the chuck pad underneath him in the bed. Repositioned with supervision and left with all needs in reach. Missed 31 min due to unwilling to participate in functional therapy.   Therapy Documentation Precautions:  Precautions Precautions: Fall Precaution Comments: L BKA with prosthesis; significant scrotal edema Restrictions Weight Bearing Restrictions: No General: PT Amount of Missed Time (min): 31 Minutes PT Missed Treatment Reason: Pain;Patient unwilling to participate Pain: Pain Assessment Pain Scale: 0-10 Pain Score: 10-Worst pain ever Pain Type: Acute pain Pain Location: Leg Pain Orientation: Right;Left Pain Descriptors / Indicators: Aching Pain Frequency: Constant Pain Onset: On-going Pain Intervention(s): Medication (See eMAR)    Therapy/Group: Individual Therapy  Canary Brim Ivory Broad, PT,  DPT, CBIS  03/11/2019, 12:11 PM

## 2019-03-11 NOTE — Plan of Care (Signed)
  Problem: RH BOWEL ELIMINATION Goal: RH STG MANAGE BOWEL WITH ASSISTANCE Description: STG Manage Bowel with mod Assistance. Outcome: Not Progressing; incontinent   Problem: RH PAIN MANAGEMENT Goal: RH STG PAIN MANAGED AT OR BELOW PT'S PAIN GOAL Description: Less than 4 on 0-10 scale Outcome: Not Progressing; pt call for pain med constantly c/o leg pain

## 2019-03-11 NOTE — Progress Notes (Signed)
[facility]    NEPHROLOGY PROGRESS NOTE  SUBJECTIVE:  States hasn't sat in a chair in four days due to leg pain.  He reports that his scrotal swelling is improving and is less painful.    Review of systems:  Denies current n/v Some shortness of breath  Edema improving overall  BM today    OBJECTIVE:  Vitals:   03/10/19 2231 03/11/19 0504  BP:  129/83  Pulse: 92 92  Resp: 20 18  Temp:  98.1 F (36.7 C)  SpO2: 96% 98%    Intake/Output Summary (Last 24 hours) at 03/11/2019 1413 Last data filed at 03/11/2019 0700 Gross per 24 hour  Intake 960 ml  Output -  Net 960 ml      General:  AAOx3 NAD  HEENT: MMM [place] AT anicteric sclera Neck:  Increased neck circumference  CV:  Heart RRR  Lungs:  Clear to auscultation bilaterally  Abd:  abd SNT/distended obese habitus  GU:  Has scrotal edema Extremities: Left BKA, trace to 1+ edema, right lower extremity wound bandaged Skin:  No skin rash Access: RIJ tunneled catheter in place; LUE AVF with bruit and thrill   MEDICATIONS:  . sodium chloride   Intravenous Once  . atorvastatin  40 mg Oral Daily  . bethanechol  25 mg Oral TID  . calcitRIOL  0.25 mcg Oral Q M,W,F-1800  . Chlorhexidine Gluconate Cloth  6 each Topical Q0600  . Chlorhexidine Gluconate Cloth  6 each Topical Q0600  . Chlorhexidine Gluconate Cloth  6 each Topical Q0600  . Chlorhexidine Gluconate Cloth  6 each Topical Q0600  . citalopram  40 mg Oral Daily  . collagenase   Topical Daily  . darbepoetin (ARANESP) injection - DIALYSIS  150 mcg Intravenous Q Tue-HD  . furosemide  80 mg Oral Daily  . gabapentin  300 mg Oral QHS  . heparin  5,000 Units Subcutaneous Q8H  . hydrocerin   Topical BID  . lidocaine  20 mL Other Once  . lidocaine   Topical 5 X Daily  . Melatonin  4.5 mg Oral QHS  . pantoprazole  40 mg Oral Daily  . tamsulosin  0.4 mg Oral QPC breakfast       LABS:   CBC Latest Ref Rng & Units 03/11/2019 03/08/2019 03/07/2019  WBC 4.0 -  10.5 K/uL 13.0(H) 12.1(H) 15.4(H)  Hemoglobin 13.0 - 17.0 g/dL 7.5(L) 7.5(L) 7.7(L)  Hematocrit 39.0 - 52.0 % 25.0(L) 24.6(L) 25.4(L)  Platelets 150 - 400 K/uL 333 261 244    CMP Latest Ref Rng & Units 03/11/2019 03/10/2019 03/09/2019  Glucose 70 - 99 mg/dL 123(H) 136(H) 145(H)  BUN 6 - 20 mg/dL 53(H) 32(H) 45(H)  Creatinine 0.61 - 1.24 mg/dL 6.64(H) 4.80(H) 5.92(H)  Sodium 135 - 145 mmol/L 126(L) 129(L) 130(L)  Potassium 3.5 - 5.1 mmol/L 4.4 4.1 4.3  Chloride 98 - 111 mmol/L 90(L) 93(L) 96(L)  CO2 22 - 32 mmol/L 22 24 22   Calcium 8.9 - 10.3 mg/dL 8.3(L) 8.3(L) 8.6(L)  Total Protein 6.5 - 8.1 g/dL - - -  Total Bilirubin 0.3 - 1.2 mg/dL - - -  Alkaline Phos 38 - 126 U/L - - -  AST 15 - 41 U/L - - -  ALT 0 - 44 U/L - - -    Lab Results  Component Value Date   PTH 100 (H) 03/04/2019   CALCIUM 8.3 (L) 03/11/2019   CAION 1.01 (L) 01/30/2019   PHOS 4.1 03/11/2019  Component Value Date/Time   COLORURINE YELLOW 03/10/2019 1054   APPEARANCEUR TURBID (A) 03/10/2019 1054   LABSPEC 1.020 03/10/2019 1054   PHURINE 5.0 03/10/2019 1054   GLUCOSEU 50 (A) 03/10/2019 1054   HGBUR SMALL (A) 03/10/2019 1054   BILIRUBINUR NEGATIVE 03/10/2019 [place] 03/10/2019 1054   PROTEINUR >=300 (A) 03/10/2019 1054   NITRITE NEGATIVE 03/10/2019 1054   LEUKOCYTESUR LARGE (A) 03/10/2019 1054      Component Value Date/Time   PHART 7.221 (L) 01/31/2019 1115   PCO2ART 34.9 01/31/2019 1115   PO2ART 80.0 (L) 01/31/2019 1115   HCO3 13.8 (L) 01/31/2019 1115   TCO2 12 (L) 01/30/2019 2339   ACIDBASEDEF 12.4 (H) 01/31/2019 1115   O2SAT 93.8 01/31/2019 1115       Component Value Date/Time   IRON 19 (L) 03/04/2019 1013   TIBC 199 (L) 03/04/2019 1013   FERRITIN 244 03/04/2019 1013   IRONPCTSAT 10 (L) 03/04/2019 1013       ASSESSMENT/PLAN:     1. Initiated HD on 01/31/19.  Had been on a TTS schedule here then increased to daily HD to optimize volume -He is to be discharged ultimately  to SNF and has now been set up at [facility] on MWF schedule.  - HD on 8/22 per recent TTS schedule then transition to MWF for a treatment on 8/24  2. Fever - noted.  Blood cultures obtained x 2 on 8/21 and NGTD.  UTI suggested per UA.  Started ceftriaxone - defer to primary if alternate agent preferred.  Urine culture with enterococcus.   3. Leg pain - per primary team  4. Volume overload- improving; does have scrotaland abdominal edema.  Optimizing volume with HD.  On lasix.  S/p several sessions of aggressive UF and we are now at or approaching his EDW 5. 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 Urology managing.  Optimize volume status as above   6. Anemia of ESRD- On ESA.  Iron with dialysis x 5 doses.  No acute indication for PRBC's.  7. 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  Decreased calcitriol to 0.25 mcg MWF dosing (from daily).  Phos stable off binder 8. Latent syphilis- per primary svc 9. 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 Wound care consulted. 10. Deconditioning- cont with PT/OT 11. Disposition- pending ability to sit in a chair for prolonged periods of time to facilitate transfer to and from HD from SNF.  He is going to work on sitting in a chair.  If discharged to a SNF will need to ensure that he has transportation to and from HD   Claudia Desanctis 03/11/2019 2:13 PM

## 2019-03-11 NOTE — Progress Notes (Signed)
Patient went to HD per bed; Per report this morning pt needs to go per recliner but HD RN said he needs to be in bed this time.

## 2019-03-12 ENCOUNTER — Inpatient Hospital Stay (HOSPITAL_COMMUNITY): Payer: Medicare Other | Admitting: Physical Therapy

## 2019-03-12 ENCOUNTER — Inpatient Hospital Stay (HOSPITAL_COMMUNITY): Payer: Medicare Other

## 2019-03-12 LAB — GLUCOSE, CAPILLARY: Glucose-Capillary: 129 mg/dL — ABNORMAL HIGH (ref 70–99)

## 2019-03-12 LAB — URINE CULTURE: Culture: >=100000 — AB

## 2019-03-12 LAB — RENAL FUNCTION PANEL
Albumin: 2 g/dL — ABNORMAL LOW (ref 3.5–5.0)
Anion gap: 12 (ref 5–15)
BUN: 37 mg/dL — ABNORMAL HIGH (ref 6–20)
CO2: 26 mmol/L (ref 22–32)
Calcium: 8.5 mg/dL — ABNORMAL LOW (ref 8.9–10.3)
Chloride: 93 mmol/L — ABNORMAL LOW (ref 98–111)
Creatinine, Ser: 5.16 mg/dL — ABNORMAL HIGH (ref 0.61–1.24)
GFR calc Af Amer: 14 mL/min — ABNORMAL LOW (ref >60)
GFR calc non Af Amer: 12 mL/min — ABNORMAL LOW (ref >60)
Glucose, Bld: 129 mg/dL — ABNORMAL HIGH (ref 70–99)
Phosphorus: 4.1 mg/dL (ref 2.5–4.6)
Potassium: 4.2 mmol/L (ref 3.5–5.1)
Sodium: 131 mmol/L — ABNORMAL LOW (ref 135–145)

## 2019-03-12 MED ORDER — CHLORHEXIDINE GLUCONATE CLOTH 2 % EX PADS
6.0000 | MEDICATED_PAD | Freq: Every day | CUTANEOUS | Status: DC
  Administered 2019-03-13: 07:00:00 6 via TOPICAL

## 2019-03-12 MED ORDER — VANCOMYCIN HCL IN DEXTROSE 1-5 GM/200ML-% IV SOLN
1000.0000 mg | INTRAVENOUS | Status: DC
  Administered 2019-03-13: 15:00:00 1000 mg via INTRAVENOUS
  Filled 2019-03-12: qty 200

## 2019-03-12 MED ORDER — VANCOMYCIN HCL 10 G IV SOLR
2000.0000 mg | Freq: Once | INTRAVENOUS | Status: AC
  Administered 2019-03-12: 2000 mg via INTRAVENOUS
  Filled 2019-03-12: qty 2000

## 2019-03-12 NOTE — Progress Notes (Signed)
[facility]    NEPHROLOGY PROGRESS NOTE  SUBJECTIVE:  He states that "dialysis went great yesterday".  Had 3 kg UF with HD on 8/22 and denied any dizziness or cramping.  Transitioned to vanc for UTI and with adjustment recommended per pharmacy and first dose today.  He sat in a wheelchair for 15 minutes yesterday and sat on the edge of the bed; he is trying to be more active.  Last post-void 76 mL.  Review of systems: Swelling improving  Denies shortness of breath  Denies n/v  OBJECTIVE:  Vitals:   03/11/19 2350 03/12/19 0545  BP:  134/82  Pulse: 92 85  Resp: 18 16  Temp:  97.8 F (36.6 C)  SpO2: 98% 99%    Intake/Output Summary (Last 24 hours) at 03/12/2019 1442 Last data filed at 03/12/2019 0730 Gross per 24 hour  Intake 240 ml  Output 3000 ml  Net -2760 ml      General:  AAOx3 NAD  HEENT: MMM [place] AT anicteric sclera Neck:  Increased neck circumference  CV:  Heart RRR  Lungs:  Clear to auscultation bilaterally  Abd:  abd SNT/distended obese habitus  GU:  scrotal edema improving Extremities: Left BKA, trace to 1+ edema, right lower extremity wound bandaged 1+ edema; tender to palpation Skin:  No skin rash Access: RIJ tunneled catheter in place; LUE AVF with bruit and thrill   MEDICATIONS:  . sodium chloride   Intravenous Once  . atorvastatin  40 mg Oral Daily  . bethanechol  25 mg Oral TID  . calcitRIOL  0.25 mcg Oral Q M,W,F-1800  . Chlorhexidine Gluconate Cloth  6 each Topical Q0600  . Chlorhexidine Gluconate Cloth  6 each Topical Q0600  . Chlorhexidine Gluconate Cloth  6 each Topical Q0600  . Chlorhexidine Gluconate Cloth  6 each Topical Q0600  . citalopram  40 mg Oral Daily  . collagenase   Topical Daily  . darbepoetin (ARANESP) injection - DIALYSIS  150 mcg Intravenous Q Tue-HD  . furosemide  80 mg Oral Daily  . gabapentin  300 mg Oral QHS  . heparin  5,000 Units Subcutaneous Q8H  . hydrocerin   Topical BID  . lidocaine  20 mL Other Once   . lidocaine   Topical 5 X Daily  . Melatonin  4.5 mg Oral QHS  . pantoprazole  40 mg Oral Daily  . tamsulosin  0.4 mg Oral QPC breakfast       LABS:   CBC Latest Ref Rng & Units 03/11/2019 03/08/2019 03/07/2019  WBC 4.0 - 10.5 K/uL 13.0(H) 12.1(H) 15.4(H)  Hemoglobin 13.0 - 17.0 g/dL 7.5(L) 7.5(L) 7.7(L)  Hematocrit 39.0 - 52.0 % 25.0(L) 24.6(L) 25.4(L)  Platelets 150 - 400 K/uL 333 261 244    CMP Latest Ref Rng & Units 03/12/2019 03/11/2019 03/10/2019  Glucose 70 - 99 mg/dL 129(H) 123(H) 136(H)  BUN 6 - 20 mg/dL 37(H) 53(H) 32(H)  Creatinine 0.61 - 1.24 mg/dL 5.16(H) 6.64(H) 4.80(H)  Sodium 135 - 145 mmol/L 131(L) 126(L) 129(L)  Potassium 3.5 - 5.1 mmol/L 4.2 4.4 4.1  Chloride 98 - 111 mmol/L 93(L) 90(L) 93(L)  CO2 22 - 32 mmol/L 26 22 24   Calcium 8.9 - 10.3 mg/dL 8.5(L) 8.3(L) 8.3(L)  Total Protein 6.5 - 8.1 g/dL - - -  Total Bilirubin 0.3 - 1.2 mg/dL - - -  Alkaline Phos 38 - 126 U/L - - -  AST 15 - 41 U/L - - -  ALT 0 - 44  U/L - - -    Lab Results  Component Value Date   PTH 100 (H) 03/04/2019   CALCIUM 8.5 (L) 03/12/2019   CAION 1.01 (L) 01/30/2019   PHOS 4.1 03/12/2019       Component Value Date/Time   COLORURINE YELLOW 03/10/2019 1054   APPEARANCEUR TURBID (A) 03/10/2019 1054   LABSPEC 1.020 03/10/2019 1054   PHURINE 5.0 03/10/2019 1054   GLUCOSEU 50 (A) 03/10/2019 1054   HGBUR SMALL (A) 03/10/2019 1054   BILIRUBINUR NEGATIVE 03/10/2019 [place] 03/10/2019 1054   PROTEINUR >=300 (A) 03/10/2019 1054   NITRITE NEGATIVE 03/10/2019 1054   LEUKOCYTESUR LARGE (A) 03/10/2019 1054      Component Value Date/Time   PHART 7.221 (L) 01/31/2019 1115   PCO2ART 34.9 01/31/2019 1115   PO2ART 80.0 (L) 01/31/2019 1115   HCO3 13.8 (L) 01/31/2019 1115   TCO2 12 (L) 01/30/2019 2339   ACIDBASEDEF 12.4 (H) 01/31/2019 1115   O2SAT 93.8 01/31/2019 1115       Component Value Date/Time   IRON 19 (L) 03/04/2019 1013   TIBC 199 (L) 03/04/2019 1013   FERRITIN  244 03/04/2019 1013   IRONPCTSAT 10 (L) 03/04/2019 1013       ASSESSMENT/PLAN:     1. Initiated HD on 01/31/19.  Had been on a TTS schedule here then increased to daily HD to optimize volume -He is to be discharged ultimately to SNF and has now been set up at [facility] on MWF schedule.  -Transition to a MWF for next treatment on 8/24  2. Enterococcus UTI - transitioned to vanc - end date 8/31.  Blood cultures obtained x 2 on 8/21 and NGTD.   3. Leg pain - per primary team  4. Volume overload- improving; does have scrotaland abdominal edema.  Optimizing volume with HD.  On lasix.  S/p several sessions of aggressive UF and we are now at or approaching his EDW 5. 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 Urology managing.  Optimize volume status as above.  Last PVR negligible as above  6. Anemia of ESRD- On ESA. Iron with dialysis x 5 doses.  No acute indication for PRBC's.  7. 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  Decreased calcitriol to 0.25 mcg MWF dosing (from daily).  Phos stable off binder 8. Latent syphilis- per primary svc 9. 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 Wound care consulted. 10. Deconditioning- cont with PT/OT  11. Disposition-  pending ability to sit in a chair for prolonged periods of time to facilitate transfer to and from HD from SNF.  He is going to work on sitting in a chair.  If discharged to a SNF will need to ensure that he has transportation to and from HD.   Claudia Desanctis 03/12/2019 2:42 PM

## 2019-03-12 NOTE — Discharge Summary (Signed)
Physician Discharge Summary  [name] MRN: [id number] DOB/AGE: 47-03-1971 47 y.o.  Admit date: 02/08/2019 Discharge date: 03/14/2019  Discharge Diagnoses:  Active Problems:   End stage renal disease (HCC)   Debility   Urinary retention   Slow transit constipation   Leukocytosis   Sleep disturbance   Essential hypertension   Anemia of chronic disease   Controlled type 2 diabetes mellitus with hyperglycemia, without long-term current use of insulin (HCC)   ESRD on dialysis [facility])   Status post below-knee amputation of left lower extremity (HCC)   Labile blood pressure   Scrotal edema   Scrotal pain   Pruritus Enterococcus UTI  Discharged Condition: Stable  Significant Diagnostic Studies: Ct Abdomen Pelvis Wo Contrast  Result Date: 02/11/2019 CLINICAL DATA:  48 year old male with history of diffuse abdominal pain. EXAM: CT ABDOMEN AND PELVIS WITHOUT CONTRAST TECHNIQUE: Multidetector CT imaging of the abdomen and pelvis was performed following the standard protocol without IV contrast. COMPARISON:  CT the abdomen and pelvis 07/31/2017. FINDINGS: Lower chest: Small bilateral pleural effusions lying dependently with associated passive subsegmental atelectasis in the lower lobes of the lungs bilaterally. Hepatobiliary: No definite suspicious cystic or solid hepatic lesions are confidently identified on today's noncontrast CT examination. Amorphous dependent high attenuation material in the gallbladder, compatible with tiny calcified gallstones. Gallbladder is not distended. No pericholecystic fluid or inflammatory changes. Pancreas: No pancreatic mass. No pancreatic ductal dilatation. No pancreatic or peripancreatic fluid collections or inflammatory changes. Spleen: Unremarkable. Adrenals/Urinary Tract: No calcifications are identified within the collecting system of either kidney, along the course of either ureter, or within the lumen of the urinary bladder. Unenhanced  appearance of the kidneys and bilateral adrenal glands is normal. Foley balloon catheter within the urinary bladder which appears nearly completely decompressed. Small amount of gas in the urinary bladder is iatrogenic. Stomach/Bowel: Normal appearance of the stomach. No pathologic dilatation of small bowel or colon. Normal appendix. Vascular/Lymphatic: Aortic atherosclerosis. No lymphadenopathy noted in the abdomen or pelvis. Reproductive: Prostate gland and seminal vesicles are unremarkable in appearance. Other: Trace volume of ascites. Mild diffuse mesenteric edema. No pneumoperitoneum. Musculoskeletal: Severe diffuse body wall edema. Severe scrotal edema (incompletely imaged). There are no aggressive appearing lytic or blastic lesions noted in the visualized portions of the skeleton. IMPRESSION: 1. There is a spectrum of findings, compatible with a state of anasarca, including bilateral pleural effusions, diffuse body wall edema, scrotal edema and trace volume of ascites. 2. No acute findings are noted in the abdomen or pelvis. Small amount of tiny calcified gallstones lying dependently in the gallbladder. No findings to suggest an acute cholecystitis at this time. 3. Normal appendix. 4. Aortic atherosclerosis. 5. Additional incidental findings, as above. Electronically Signed   By: Vinnie Langton M.D.   On: 02/11/2019 07:33   Dg Chest 2 View  Result Date: 03/04/2019 CLINICAL DATA:  Severe cough and weakness. EXAM: CHEST - 2 VIEW COMPARISON:  Chest x-rays dated 02/04/2019 and 02/03/2019. FINDINGS: Stable cardiomegaly. LEFT basilar opacity, compatible with the small pleural effusion and passive atelectasis demonstrated on earlier CT abdomen. No new lung findings. RIGHT-sided central catheter is stable in position with tip at the level of the mid/lower SVC. IMPRESSION: Stable chest x-ray. Small LEFT pleural effusion and passive atelectasis, as also demonstrated on earlier CT abdomen. No new lung findings.  Stable cardiomegaly. Electronically Signed   By: Franki Cabot M.D.   On: 03/04/2019 11:33    Labs:  Basic Metabolic Panel: Recent Labs  Lab 03/07/19 1422 03/08/19 0549 03/09/19 0824 03/10/19 0631 03/11/19 0645 03/12/19 0712  NA 138 130* 130* 129* 126* 131*  K 3.0* 4.2 4.3 4.1 4.4 4.2  CL 106 94* 96* 93* 90* 93*  CO2 20* 25 22 24 22 26   GLUCOSE 125* 133* 145* 136* 123* 129*  BUN 25* 26* 45* 32* 53* 37*  CREATININE 3.83* 4.04* 5.92* 4.80* 6.64* 5.16*  CALCIUM 6.1* 8.2* 8.6* 8.3* 8.3* 8.5*  PHOS 2.2* 3.1 3.9 3.2 4.1 4.1    CBC: Recent Labs  Lab 03/07/19 0545 03/08/19 1410 03/11/19 0645  WBC 15.4* 12.1* 13.0*  HGB 7.7* 7.5* 7.5*  HCT 25.4* 24.6* 25.0*  MCV 91.4 94.6 92.9  PLT 244 261 333    CBG: No results for input(s): GLUCAP in the last 168 hours.  Family history.  Mother and father with hypertension hyperlipidemia.  Grandmother with diabetes.  Denies any colon cancer  Brief HPI:   Michael Riggs is a 48 year old right-handed male with history of syphilis, anemia of chronic disease, chronic kidney disease with recent dialysis initiated, diabetes mellitus, morbid obesity with BMI 43.78, hypertension and left BKA 5 years ago in [facility].  Per chart review lives with elderly mother.  Reportedly independent with prosthesis and wheelchair prior to admission.  Mother is limited physically.  He has a brother that works nights.  Recent admission 01/30/2019 to 02/03/2019 for uremia with acute kidney injury receiving tunneled catheter 02/02/2019 and was to begin outpatient hemodialysis.  Patient presented 02/04/2019 with increasing shortness of breath as well as scrotal edema.  Chest x-ray showed mild vascular congestion.  There was noted skin tears of the right lower extremity patient had noted recent fall since his recent discharge.  Hemoglobin 7.9, creatinine 8.82, calcium 6.3, COVID negative.  Hemodialysis again follow-up and did initially receive an extra treatment  of hemodialysis for suspect volume overload.  Wound care nurse follow-up for wounds to right lower extremity with dressing changes as directed.  Subcutaneous heparin for DVT prophylaxis.  A Foley catheter tube was in place.  Patient was admitted for a comprehensive rehab program  Hospital Course: Michael Riggs was admitted to rehab 02/08/2019 for inpatient therapies to consist of PT, ST and OT at least three hours five days a week. Past admission physiatrist, therapy team and rehab RN have worked together to provide customized collaborative inpatient rehab.  Pertaining to patient's debility secondary to end-stage renal disease/multi-medical.  Renal service follow-up hemodialysis now scheduled and monitoring for any fluid overload as patient remained on Lasix.  Anemia of chronic disease hemoglobin 6.9-8.2 over the past 10 days.  Patient asymptomatic.  Followed closely by hemodialysis can transfuse as needed with hemodialysis and patient remained on Aranesp weekly with hemodialysis.  Patient scrotal edema had greatly improved with scheduled hemodialysis.  Patient had recently had AV fistula placed 01/09/2019.  Subcutaneous heparin for DVT prophylaxis.  Pain management with the use of Neurontin 300 mg nightly and Ultram as needed as well as Robaxin as needed with good results.  Mood stabilization with Celexa as well as melatonin and follow-up per neuropsychology.  Patient did need encouragement at times to participate with therapies.  Nonvascular right lower extremity wounds with wound care and nurse follow-up with skin dressing changes as directed.  Blood sugars overall monitored hemoglobin A1c of 5.0.  Sliding scale insulin is since been discontinued.  Noted upon admission to rehab services elevated postvoid residuals with urinary retention Flomax initiated.  Patient was refusing in and  out catheterization there was some difficulty in passing the catheter.  Urology services consulted with Foley tube placed  under fluoroscopy.  As of 03/17/2019 patient voiding multiple times post void residual is much improved he continued on Flomax as well as low-dose Urecholine and plan was to follow-up outpatient urology.  Patient with low-grade fever blood cultures no growth to date 03/10/2019.  Most recent urinalysis and urine culture 03/10/2019 greater than 100,000 enterococcus placed on vancomycin x7 days   Physical exam.  Blood pressure 94/64 pulse 86 temperature 98.5 respiration 21 oxygen saturation 99% room air Constitutional.  Alert and oriented morbidly obese HEENT Head.  Normocephalic and atraumatic Eyes.  Pupils round and reactive to light EOMs normal no nystagmus Neck.  Supple nontender no JVD no thyromegaly present Cardiovascular normal rate exam reveals no friction rub or murmur heard Respiratory effort normal no respiratory distress no wheezes GI.  Soft exhibits no distention nontender Musculoskeletal +1 right lower extremity edema decreased edema left stump shrinker in place.  Scrotal edema size of a large softball Neurological alert and oriented coordination normal upper extremities 5 out of 5 right lower extremity 2- to 3 out of 5 proximal to 3 out of 5 distal.  Decreased light touch right foot.  Good insight and awareness.  Left BKA healed.  Skin wound right lower extremity with broken bulla one remaining bulla on inner thigh some other scattered lesions callus on patella and tibia  Rehab course: During patient's stay in rehab weekly team conferences were held to monitor patient's progress, set goals and discuss barriers to discharge. At admission, patient required moderate assist supine to sit moderate assist sit to supine.  Max assist lower body bathing set up upper body bathing max assist lower body dressing set up upper body dressing  He/She  has had improvement in activity tolerance, balance, postural control as well as ability to compensate for deficits. He/She has had improvement in functional  use RUE/LUE  and RLE/LLE as well as improvement in awareness.  Assisted patient with hygiene with total assist and patient performed rolling to the right and the left with supervision using bed rails.  He would decline some out of bed activities needed ongoing encouragement.  He would sit edge of bed with supervision.  Perform minimal assist sit to stand with rolling walker total assist to don left prosthetic.  Occupational Therapy had ongoing discussions with patient to be able to tolerate sitting in recliner being able to transfer to wheelchair to get his dialysis.  He was able to transfer out of bed to wheelchair more consistently with minimal assist rolling walker left lower extremity prosthesis however he did need heavy encouragement.  Due to limited assistance at home was felt skilled nursing facility was needed bed become available 03/13/2019.       Disposition: Discharge to skilled nursing facility   Diet: Carb modified renal diet with 1200 mL fluid restriction  Special Instructions: Continue hemodialysis as scheduled per renal services and has been set up at [facility]  Wound care.   Cleanse right leg with soap and water and pat dry.  Moisturize leg with barrier cream.  Clean wounds to right lower leg with normal saline and pat dry.  Apply Santyl to open wounds.  Cover with normal saline moist 2 x 2.  Wrap legs with Kerlix and tape.  Change daily  Medications at discharge. 1.  Tylenol as needed 2.  Lipitor 40 mg p.o. daily 3.  Urecholine 25 mg p.o.  3 times daily 4.  Dulcolax suppository 10 mg daily as needed moderate constipation 5.  Rocalatrol 0.25 mcg p.o. Monday Wednesday Friday 6.  Celexa 40 mg p.o. daily 7.  Aranesp 150 mcg every Tuesday with hemodialysis 8.  Lasix 80 mg p.o. daily 9.  Neurontin 300 mg p.o. nightly 10.  Melatonin 4.5 mg p.o. nightly 11.  Robaxin 500 mg every 8 hours as needed muscle spasms 12.  Protonix 40 mg p.o. daily 13.  MiraLAX daily as  needed hold for loose stools 14.  Flomax 0.4 mg p.o. daily after breakfast 15.  Tramadol 50 mg every 6 as needed moderate pain 16.  Vancomycin 1000 mg intravenous every Monday Wednesday Friday with hemodialysis beginning 03/13/2019 x6 days     Contact information for follow-up providers    Rexene Agent, MD Follow up.   Specialty: Nephrology Why: Call for appointment Contact information: [address] [place] [address] [phone]        [name], MD Follow up.   Specialties: Vascular Surgery, Cardiology Why: Call for appointment Contact information: [place] [address] [phone]        Jamse Arn, MD Follow up.   Specialty: Physical Medicine and Rehabilitation Why: office to call for appointment Contact information: [place] [address] [phone]        Franchot Gallo, MD Follow up.   Specialty: Urology Why: Call for appointment Contact information: [place] [address] [phone]            Contact information for after-discharge care    Destination    [facility] Preferred SNF .   Service: Skilled Nursing Contact information: [address] [place] [address] [phone]                  Signed: Lavon Paganini [place] 03/12/2019, 10:02 AM

## 2019-03-12 NOTE — Progress Notes (Signed)
RT placed pt on CPAP dream station for the night on auto titrate w/settings of 10 max 5 min for pt comfort w/no oxygen bled into the system. Pt respiratory status is stable at this time. RT will continue to monitor.

## 2019-03-12 NOTE — Progress Notes (Signed)
[place] PHYSICAL MEDICINE & REHABILITATION PROGRESS NOTE   Subjective/Complaints: Patient continues to complain of some right leg discomfort.  He is also noticed bruise on his left upper thigh.  That area is somewhat uncomfortable.  He states it is difficult for him to participate in all therapies secondary to pain.  Objective:   No results found. Recent Labs    03/11/19 0645  WBC 13.0*  HGB 7.5*  HCT 25.0*  PLT 333   Recent Labs    03/10/19 0631 03/11/19 0645  NA 129* 126*  K 4.1 4.4  CL 93* 90*  CO2 24 22  GLUCOSE 136* 123*  BUN 32* 53*  CREATININE 4.80* 6.64*  CALCIUM 8.3* 8.3*     Physical Exam: Vital Signs Blood pressure 134/82, pulse 85, temperature 97.8 F (36.6 C), temperature source Oral, resp. rate 16, height 5\' 10"  (1.778 m), weight 119.5 kg, SpO2 99 %.   Chronically ill-appearing male in no acute distress. HEENT exam atraumatic, normocephalic, neck supple without jugular venous distention.  Extraocular muscles are intact.  Chest clear to auscultation cardiac exam S1-S2 are regular. Abdominal exam morbidly obese with bowel sounds, soft and nontender. Extremities : Right lower extremity below the knee is wrapped with Kerlix.  There is significant thickening and hyperpigmentation of the skin.  There is tenderness to light touch of the right calf.  Status post left BKA.  There is a 2 x 2 centimeter ecchymosis on the medial aspect of the left thigh.  On palpation there is some thickening beneath the ecchymosis.  Most likely hematoma.  Neurologic exam is alert   Assessment/Plan: 1. Functional deficits secondary to debility    Medical Problem List and Plan: 1.Debilitysecondary to end-stage renal disease/multi-medical. Hemodialysis recently initiated.  Continue inpatient rehab.  At a long discussion with the patient about his need to participate actively in therapy.  He complains of discomfort.  We had a discussion that he will need to tolerate some of the  discomfort.  He seems to understand and be engaged in therapy. Plan is for skilled nursing facility.  Reviewed nephrology note. 2. Antithrombotics: -DVT/anticoagulation:Subcutaneous heparin -antiplatelet therapy: N/A 3. Pain Management: Discussed pain in detail with the patient.  I do not think there is any reason to change his pain medication.  He understands that the pain is related to his current medical condition.  Chronic pain will be relieved with aggressive weight loss and active participation in physical therapy. 4. Mood: Continue antidepressants. 5. Neuropsych: Patient can make his own decisions. 6. Skin/Wound Care:Continue current therapies. 7. Fluids/Electrolytes/Nutrition: Basic Metabolic Panel:    Component Value Date/Time   NA 126 (L) 03/11/2019 0645   K 4.4 03/11/2019 0645   CL 90 (L) 03/11/2019 0645   CO2 22 03/11/2019 0645   BUN 53 (H) 03/11/2019 0645   CREATININE 6.64 (H) 03/11/2019 0645   GLUCOSE 123 (H) 03/11/2019 0645   CALCIUM 8.3 (L) 03/11/2019 0645   Patient on hemodialysis and followed by nephrology. 8.  ESRD:. Status post AV fistula 01/09/2019.   Filed Weights   03/11/19 1527 03/11/19 1905 03/12/19 0545  Weight: 121.4 kg 118.5 kg 119.5 kg    9. Diabetes mellitus. Recent hemoglobin A1c 5.0.   Controlled    Continue to monitor  10. Anemia of chronic disease- ESRD. Continue Aranesp  Hemoglobin 8.0 on 8/11, recs per nephro, labs with HD 11. History of left BKA 5 years ago in [place].   12. Hypertension. Norvasc 10 mg daily.  113/72-182/88    Patient had 1 markedly elevated blood pressure yesterday.  Previous blood pressures and repeat blood pressure seem to be okay.  I will not adjust medications at this time. 13. Morbid obesity. BMI 37.8.  Discussed need for aggressive weight loss 14. Hyperlipidemia. Lipitor 15. History of syphilis. Treated outpatient with penicillin to the healthcare  department recently. Outpatient follow-up 16. Urine retention:    Resolved. 17.  Sleep disturbance  Patient states he sleeps well.  We will continue CPAP. 18.  Leukocytosis Lab Results  Component Value Date   WBC 13.0 (H) 03/11/2019   No signs or symptoms of infection.  We will continue to monitor. 19.  Constipation  Resolved. 20.  Pruritus--patient denies 21.  Hyponatremia as per nephro    LOS: 32 days A FACE TO FACE EVALUATION WAS PERFORMED  Danyelle Brookover H Taevin Mcferran 03/12/2019, 8:18 AM

## 2019-03-12 NOTE — Progress Notes (Signed)
Physical Therapy Session Note  Patient Details  Name: Michael Riggs MRN: [id number] Date of Birth: 02/11/1971  Today's Date: 03/12/2019 PT Individual Time: 1345-1415 PT Individual Time Calculation (min): 30 min   Short Term Goals: Week 1:  PT Short Term Goal 1 (Week 1): Pt will be able to perform bed mobility w/ max assist PT Short Term Goal 1 - Progress (Week 1): Met PT Short Term Goal 2 (Week 1): Pt will be able to perform functional transfer bed<>w/c w/ max assist PT Short Term Goal 2 - Progress (Week 1): Met PT Short Term Goal 3 (Week 1): Pt will be able to perform w/c propulsion for functional mobility w/ supervision x50' PT Short Term Goal 3 - Progress (Week 1): Partly met  Skilled Therapeutic Interventions/Progress Updates:  Pt was seen bedside in the pm. Pt initially reluctant to participate but agreed to only demonstrate transfer from bed to w/c with rolling walker. Pt transferred supine to edge of bed with side rail, head of bed elevated and min A with verbal cues. Pt dependent with donning and doffing prosthesis. Pt transferred sit to stand from edge of bed with rolling walker and min A with verbal cues. Pt performed stand pivot transfer with rolling walker and min to mod A and verbal cues. Pt transferred w/c to stand with rolling walker and min to mod A with verbal cues. Pt transferred to edge of bed with rolling walker and min to mod A. Pt transferred edge of bed to supine with S. Pt left sitting up in bed with call bell within reach.  Therapy Documentation Precautions:  Precautions Precautions: Fall Precaution Comments: L BKA with prosthesis; significant scrotal edema Restrictions Weight Bearing Restrictions: No General: PT Amount of Missed Time (min): 30 Minutes PT Missed Treatment Reason: Patient fatigue;Pain;Patient unwilling to participate Vital Signs:   Pain: Pt c/o burning B LEs with mobility.    Therapy/Group: Individual Therapy  Dub Amis 03/12/2019, 2:20 PM

## 2019-03-12 NOTE — Progress Notes (Addendum)
Occupational Therapy Session Note  Patient Details  Name: Sneijder Strey MRN: [id number] Date of Birth: Mar 31, 1971  Today's Date: 03/12/2019 OT Individual Time: [id number] Individual Time Calculation (min): 59 min    Short Term Goals: Week 1:  OT Short Term Goal 1 (Week 1): Pt will tolerate sitting EOB wiht MIN A for sitting balance for 10 min during functional activity OT Short Term Goal 1 - Progress (Week 1): Progressing toward goal OT Short Term Goal 2 (Week 1): Pt will transfer to [facility] with MOD A of 1 caregiver to decrease caregiver burden OT Short Term Goal 2 - Progress (Week 1): Progressing toward goal OT Short Term Goal 3 (Week 1): Pt will don sock with AE PRN and min VC for technique OT Short Term Goal 3 - Progress (Week 1): Progressing toward goal OT Short Term Goal 4 (Week 1): Pt will bathe with MIN A OT Short Term Goal 4 - Progress (Week 1): Progressing toward goal  Skilled Therapeutic Interventions/Progress Updates:    1;1. Pt received in bed with pain 7/10 pain in RLE which is better than yesterday. Pt agreeable to sitting EOB for shaving and changing gowns. Pt declines using prosthesis to transfer into chair via stand pivot with RW d/t RLE pain despite max encouragement. Pt states, "im only doing that in an emergency or until I have to." Pt supine>sitting EOB with A for trunk elevation and HOB in most upright position. Pt shaves with set up seated EOB and OT demo use of wide sock aide to don sock while pt is shaving. Pt completes grooming seated EOB for oral care with setup. Pt washes UB with S and LB with A for foot. Pt dons new gown with set up. Pt dons sock with wide sock aide with VC. Exited session with pt seated in bed, exit alarm on and call light in reach. Pt missed 16 min d/t refusal to participate in any other activity OOB.  Therapy Documentation Precautions:  Precautions Precautions: Fall Precaution Comments: L BKA with prosthesis; significant scrotal  edema Restrictions Weight Bearing Restrictions: No General:   Vital Signs:   Pain: Pain Assessment Pain Scale: 0-10 Pain Score: 10-Worst pain ever Pain Type: Chronic pain Pain Location: Leg Pain Orientation: Right Pain Descriptors / Indicators: Aching Pain Frequency: Constant Pain Onset: On-going Pain Intervention(s): Medication (See eMAR) ADL: ADL Eating: Independent Grooming: Setup Upper Body Bathing: Minimal assistance Where Assessed-Upper Body Bathing: Bed level Lower Body Bathing: Maximal assistance Where Assessed-Lower Body Bathing: Bed level Upper Body Dressing: Moderate assistance Where Assessed-Upper Body Dressing: Bed level Toileting: Not assessed Toilet Transfer: Not assessed Vision   Perception    Praxis   Exercises:   Other Treatments:     Therapy/Group: Individual Therapy  Tonny Branch 03/12/2019, 9:59 AM

## 2019-03-13 ENCOUNTER — Inpatient Hospital Stay (HOSPITAL_COMMUNITY): Payer: Medicare Other

## 2019-03-13 ENCOUNTER — Inpatient Hospital Stay (HOSPITAL_COMMUNITY): Payer: Medicare Other | Admitting: Occupational Therapy

## 2019-03-13 LAB — CBC
HCT: 23.5 % — ABNORMAL LOW (ref 39.0–52.0)
Hemoglobin: 7 g/dL — ABNORMAL LOW (ref 13.0–17.0)
MCH: 27.9 pg (ref 26.0–34.0)
MCHC: 29.8 g/dL — ABNORMAL LOW (ref 30.0–36.0)
MCV: 93.6 fL (ref 80.0–100.0)
Platelets: 357 10*3/uL (ref 150–400)
RBC: 2.51 MIL/uL — ABNORMAL LOW (ref 4.22–5.81)
RDW: 14.7 % (ref 11.5–15.5)
WBC: 12 10*3/uL — ABNORMAL HIGH (ref 4.0–10.5)
nRBC: 0 % (ref 0.0–0.2)

## 2019-03-13 LAB — RENAL FUNCTION PANEL
Albumin: 1.9 g/dL — ABNORMAL LOW (ref 3.5–5.0)
Anion gap: 14 (ref 5–15)
BUN: 59 mg/dL — ABNORMAL HIGH (ref 6–20)
CO2: 24 mmol/L (ref 22–32)
Calcium: 8.5 mg/dL — ABNORMAL LOW (ref 8.9–10.3)
Chloride: 92 mmol/L — ABNORMAL LOW (ref 98–111)
Creatinine, Ser: 6.79 mg/dL — ABNORMAL HIGH (ref 0.61–1.24)
GFR calc Af Amer: 10 mL/min — ABNORMAL LOW (ref >60)
GFR calc non Af Amer: 9 mL/min — ABNORMAL LOW (ref >60)
Glucose, Bld: 114 mg/dL — ABNORMAL HIGH (ref 70–99)
Phosphorus: 4.4 mg/dL (ref 2.5–4.6)
Potassium: 4.6 mmol/L (ref 3.5–5.1)
Sodium: 130 mmol/L — ABNORMAL LOW (ref 135–145)

## 2019-03-13 LAB — PREPARE RBC (CROSSMATCH)

## 2019-03-13 LAB — SARS CORONAVIRUS 2 (TAT 6-24 HRS): SARS Coronavirus 2: NEGATIVE

## 2019-03-13 MED ORDER — SODIUM CHLORIDE 0.9% IV SOLUTION: Freq: Once | INTRAVENOUS | Status: DC

## 2019-03-13 MED ORDER — ACETAMINOPHEN 325 MG PO TABS: 650.0000 mg | ORAL_TABLET | Freq: Four times a day (QID) | ORAL | Status: DC | PRN

## 2019-03-13 MED ORDER — METHOCARBAMOL 500 MG PO TABS: 500.0000 mg | ORAL_TABLET | Freq: Three times a day (TID) | ORAL | Status: DC | PRN

## 2019-03-13 MED ORDER — CALCITRIOL 0.25 MCG PO CAPS: 0.2500 ug | ORAL_CAPSULE | ORAL | Status: DC

## 2019-03-13 MED ORDER — VANCOMYCIN HCL IN DEXTROSE 1-5 GM/200ML-% IV SOLN
INTRAVENOUS | Status: AC
  Administered 2019-03-13: 1000 mg via INTRAVENOUS
  Filled 2019-03-13: qty 200

## 2019-03-13 MED ORDER — ATORVASTATIN CALCIUM 40 MG PO TABS: 40.0000 mg | ORAL_TABLET | Freq: Every day | ORAL | Status: DC

## 2019-03-13 MED ORDER — TAMSULOSIN HCL 0.4 MG PO CAPS: 0.4000 mg | ORAL_CAPSULE | Freq: Every day | ORAL | Status: DC

## 2019-03-13 MED ORDER — BISACODYL 10 MG RE SUPP: 10.0000 mg | Freq: Every day | RECTAL | 0 refills | Status: DC | PRN

## 2019-03-13 MED ORDER — PANTOPRAZOLE SODIUM 40 MG PO TBEC: 40.0000 mg | DELAYED_RELEASE_TABLET | Freq: Every day | ORAL | Status: DC

## 2019-03-13 MED ORDER — HEPARIN SODIUM (PORCINE) 1000 UNIT/ML IJ SOLN
INTRAMUSCULAR | Status: AC
  Administered 2019-03-13: 3800 [IU]
  Filled 2019-03-13: qty 3

## 2019-03-13 MED ORDER — HEPARIN SODIUM (PORCINE) 1000 UNIT/ML IJ SOLN
INTRAMUSCULAR | Status: AC
  Filled 2019-03-13: qty 4

## 2019-03-13 MED ORDER — TRAMADOL HCL 50 MG PO TABS: 50.0000 mg | ORAL_TABLET | Freq: Four times a day (QID) | ORAL | 0 refills | Status: DC | PRN

## 2019-03-13 MED ORDER — FUROSEMIDE 80 MG PO TABS: 80.0000 mg | ORAL_TABLET | Freq: Every day | ORAL | Status: DC

## 2019-03-13 MED ORDER — DARBEPOETIN ALFA 150 MCG/0.3ML IJ SOSY: 150.0000 ug | PREFILLED_SYRINGE | INTRAMUSCULAR | Status: DC

## 2019-03-13 MED ORDER — VANCOMYCIN HCL IN DEXTROSE 1-5 GM/200ML-% IV SOLN: 1000.0000 mg | INTRAVENOUS | Status: DC

## 2019-03-13 MED ORDER — BETHANECHOL CHLORIDE 25 MG PO TABS: 25.0000 mg | ORAL_TABLET | Freq: Three times a day (TID) | ORAL | Status: DC

## 2019-03-13 NOTE — Progress Notes (Signed)
RT placed patient on CPAP. Patient tolerating well at this time. RT will monitor as needed. 

## 2019-03-13 NOTE — Progress Notes (Signed)
Pharmacy Antibiotic Note  Michael Riggs is a 48 y.o. male admitted on 02/08/2019 to CIR now found to have Enterococcal UTI with plans to treat for 7 days with Vancomycin.  The patient is ESRD - transitioning to MWF HD schedule. Doses currently entered appropriate, stop date in place.   Plan: - Cont Vancomycin 1g/HD-MWF - Stop date s/p dose with HD on 8/28 - Will continue to follow HD schedule/duration, culture results, LOT, and antibiotic de-escalation plans   Height: 5\' 10"  (177.8 cm) Weight: (pt in HD chair) IBW/kg (Calculated) : 73  Temp (24hrs), Avg:99 F (37.2 C), Min:98.5 F (36.9 C), Max:99.9 F (37.7 C)  Recent Labs  Lab 03/07/19 0545  03/08/19 1410 03/09/19 0824 03/10/19 0631 03/11/19 0645 03/12/19 0712 03/13/19 0716 03/13/19 1256  WBC 15.4*  --  12.1*  --   --  13.0*  --   --  12.0*  CREATININE  --    < >  --  5.92* 4.80* 6.64* 5.16* 6.79*  --    < > = values in this interval not displayed.    Estimated Creatinine Clearance: 17.7 mL/min (A) (by C-G formula based on SCr of 6.79 mg/dL (H)).    No Known Allergies   Thank you for allowing pharmacy to be a part of this patient's care.  Alycia Rossetti, PharmD, BCPS Clinical Pharmacist Clinical phone for 03/13/2019: [phone] 03/13/2019 1:35 PM   **Pharmacist phone directory can now be found on [email or web address] ([email or web address]).  Listed under [place].

## 2019-03-13 NOTE — Progress Notes (Signed)
Occupational Therapy Session Note  Patient Details  Name: Michael Riggs MRN: [id number] Date of Birth: July 17, 1971  Today's Date: 03/13/2019 OT Individual Time: [id number] OT Individual Time Calculation (min): 42 min    Short Term Goals: Week 5:  OT Short Term Goal 1 (Week 5): STG=LTG 2/2 ELOS  Skilled Therapeutic Interventions/Progress Updates:    Pt reluctantly agreed to participate in bathing session and grooming tasks at the EOB.  Supervision with increased time for transfer from supine HOB elevated 50 degrees to sitting EOB.  Increased pain in his right leg noted during the transition.  He was then able to complete all UB bathing with setup as well as grooming task of brushing his teeth.  Pt sat EOB for approximately 30 mins and agreed to try and transfer out of the bed next session with OT at around 10:30 or so.  Pt transferred back to supine with supervision and was left in bed with call button and phone in reach with safety belt in place.  Therapy Documentation Precautions:  Precautions Precautions: Fall Precaution Comments: L BKA with prosthesis Restrictions Weight Bearing Restrictions: No  Pain: Pain Assessment Pain Scale: 0-10 Pain Score: 0-No pain Faces Pain Scale: Hurts a little bit Pain Type: Acute pain Pain Location: Leg Pain Orientation: Right Pain Descriptors / Indicators: Discomfort Pain Frequency: Constant Pain Onset: With Activity Patients Stated Pain Goal: 6 Pain Intervention(s): Repositioned;Emotional support ADL: See Care Tool Section for some details of ADL  Therapy/Group: Individual Therapy  Nelta Caudill OTR/L 03/13/2019, 12:10 PM

## 2019-03-13 NOTE — Progress Notes (Signed)
[facility] ROUNDING NOTE   Subjective:   This is a very pleasant 67 gentleman with a recent start to dialysis 01/31/2019 TTS schedule.  He is tentatively set up for discharge to [facility] on a Monday Wednesday Friday dialysis schedule.  Unfortunately he has been weak and immobile and is now been sitting on dialysis.  He needs to do this prior to being able to proceed with outpatient dialysis treatments.  He has been receiving aggressive ultrafiltration.  His last dialysis treatment is 03/11/2019 with 3 L removed.  He has bladder outlet obstruction and edema of his genitalia.  Urology has been following.  He has a right lower extremity nonhealing wound with concern for calciphylaxis in the skin punch biopsy 02/22/2019 with no calciphylaxis noted.  He is to be discharged to SNF when appropriate  Blood pressure 130/86 pulse 97 temperature 99.5 O2 sats 98% room air  Sodium 130 potassium 4.6 chloride 92 CO2 24 BUN 59 creatinine 6.79 glucose 114 calcium 8.5 phosphorus 4.4 albumin 1.9 WBC 13.0 hemoglobin 7.5 platelets  333  Lipitor 40 mg daily calcitriol 0.25 mcg Monday Wednesday Friday, Celexa 40 mg daily, darbepoetin 150 mcg every Tuesday Lasix 80 mg daily, Neurontin 300 mg daily, Protonix 40 mg daily, Flomax 0.4 mg daily, ferrilicit 0000000 mg Monday Wednesday Friday x 5 doses, vancomycin dosing per pharmacy   Objective:  Vital signs in last 24 hours:  Temp:  [98.6 F (37 C)-99.9 F (37.7 C)] 99.5 F (37.5 C) (08/24 0400) Pulse Rate:  [90-100] 97 (08/24 0400) Resp:  [18-20] 20 (08/24 0400) BP: (118-130)/(73-86) 130/86 (08/24 0400) SpO2:  [97 %-100 %] 98 % (08/24 0400) FiO2 (%):  [21 %] 21 % (08/23 2314) Weight:  [122.8 kg] 122.8 kg (08/24 0410)  Weight change: 1.4 kg Filed Weights   03/11/19 1905 03/12/19 0545 03/13/19 0410  Weight: 118.5 kg 119.5 kg 122.8 kg    Intake/Output: I/O last 3 completed shifts: In: 480 [P.O.:480] Out: 3000 [Other:3000]   Intake/Output  this shift:  Total I/O In: 400 [P.O.:400] Out: -   General:  AAOx3 NAD  HEENT: MMM [place] AT anicteric sclera Neck:  Increased neck circumference  CV:  Heart RRR  Lungs:  Clear to auscultation bilaterally  Abd:  abd SNT/distended obese habitus  GU:  scrotal edema improving Extremities: Left BKA, trace to 1+ edema, right lower extremity wound bandaged 1+ edema; tender to palpation Skin:  No skin rash Access: RIJ tunneled catheter in place; LUE AVF with bruit and thrill    Basic Metabolic Panel: Recent Labs  Lab 03/09/19 0824 03/10/19 0631 03/11/19 0645 03/12/19 0712 03/13/19 0716  NA 130* 129* 126* 131* 130*  K 4.3 4.1 4.4 4.2 4.6  CL 96* 93* 90* 93* 92*  CO2 22 24 22 26 24   GLUCOSE 145* 136* 123* 129* 114*  BUN 45* 32* 53* 37* 59*  CREATININE 5.92* 4.80* 6.64* 5.16* 6.79*  CALCIUM 8.6* 8.3* 8.3* 8.5* 8.5*  PHOS 3.9 3.2 4.1 4.1 4.4    Liver Function Tests: Recent Labs  Lab 03/09/19 0824 03/10/19 0631 03/11/19 0645 03/12/19 0712 03/13/19 0716  ALBUMIN 2.0* 1.9* 2.0* 2.0* 1.9*   No results for input(s): LIPASE, AMYLASE in the last 168 hours. No results for input(s): AMMONIA in the last 168 hours.  CBC: Recent Labs  Lab 03/07/19 0545 03/08/19 1410 03/11/19 0645  WBC 15.4* 12.1* 13.0*  HGB 7.7* 7.5* 7.5*  HCT 25.4* 24.6* 25.0*  MCV 91.4 94.6 92.9  PLT 244  261 333    Cardiac Enzymes: No results for input(s): CKTOTAL, CKMB, CKMBINDEX, TROPONINI in the last 168 hours.  BNP: Invalid input(s): POCBNP  CBG: Recent Labs  Lab 03/12/19 1611  GLUCAP 129*    Microbiology: Results for orders placed or performed during the hospital encounter of 02/08/19  Urine Culture     Status: Abnormal   Collection Time: 02/13/19  1:25 PM   Specimen: Urine, Random  Result Value Ref Range Status   Specimen Description URINE, RANDOM  Final   Special Requests NONE  Final   Culture (A)  Final    <10,000 COLONIES/mL INSIGNIFICANT GROWTH Performed at [place], [place] [address], [place], [place] [address]    Report Status 02/14/2019 FINAL  Final  Urine Culture     Status: Abnormal   Collection Time: 03/10/19  8:36 AM   Specimen: Urine, Random  Result Value Ref Range Status   Specimen Description URINE, RANDOM  Final   Special Requests   Final    NONE Performed at [facility], [place] [address] [place], [place] [address]    Culture >=100,000 COLONIES/mL ENTEROCOCCUS FAECIUM (A)  Final   Report Status 03/12/2019 FINAL  Final   Organism ID, Bacteria ENTEROCOCCUS FAECIUM (A)  Final      Susceptibility   Enterococcus faecium - MIC*    AMPICILLIN >=32 RESISTANT Resistant     LEVOFLOXACIN 4 INTERMEDIATE Intermediate     NITROFURANTOIN 64 INTERMEDIATE Intermediate     VANCOMYCIN <=0.5 SENSITIVE Sensitive     * >=100,000 COLONIES/mL ENTEROCOCCUS FAECIUM  Culture, blood (routine x 2)     Status: None (Preliminary result)   Collection Time: 03/10/19  2:08 PM   Specimen: BLOOD  Result Value Ref Range Status   Specimen Description BLOOD RIGHT ANTECUBITAL  Final   Special Requests   Final    BOTTLES DRAWN AEROBIC AND ANAEROBIC Blood Culture adequate volume   Culture   Final    NO GROWTH 2 DAYS Performed at [facility], [place] [address] [place], [place] [address]    Report Status PENDING  Incomplete  Culture, blood (routine x 2)     Status: None (Preliminary result)   Collection Time: 03/10/19  2:15 PM   Specimen: BLOOD RIGHT HAND  Result Value Ref Range Status   Specimen Description BLOOD RIGHT HAND  Final   Special Requests   Final    BOTTLES DRAWN AEROBIC ONLY Blood Culture results may not be optimal due to an inadequate volume of blood received in culture bottles   Culture   Final    NO GROWTH 2 DAYS Performed at [facility], [place] [address], [place], [place] [address]    Report Status PENDING  Incomplete    Coagulation Studies: No results for input(s): LABPROT, INR in the last 72 hours.  Urinalysis: Recent Labs     03/10/19 1054  COLORURINE YELLOW  LABSPEC 1.020  PHURINE 5.0  GLUCOSEU 50*  HGBUR SMALL*  BILIRUBINUR NEGATIVE  KETONESUR NEGATIVE  PROTEINUR >=300*  NITRITE NEGATIVE  LEUKOCYTESUR LARGE*      Imaging: No results found.   Medications:   . ferric gluconate (FERRLECIT/NULECIT) IV 125 mg (03/06/19 1600)  . vancomycin     . sodium chloride   Intravenous Once  . atorvastatin  40 mg Oral Daily  . bethanechol  25 mg Oral TID  . calcitRIOL  0.25 mcg Oral Q M,W,F-1800  . Chlorhexidine Gluconate Cloth  6  each Topical [id number]  . Chlorhexidine Gluconate Cloth  6 each Topical Q0600  . Chlorhexidine Gluconate Cloth  6 each Topical Q0600  . Chlorhexidine Gluconate Cloth  6 each Topical Q0600  . citalopram  40 mg Oral Daily  . collagenase   Topical Daily  . darbepoetin (ARANESP) injection - DIALYSIS  150 mcg Intravenous Q Tue-HD  . furosemide  80 mg Oral Daily  . gabapentin  300 mg Oral QHS  . heparin  5,000 Units Subcutaneous Q8H  . hydrocerin   Topical BID  . lidocaine  20 mL Other Once  . lidocaine   Topical 5 X Daily  . Melatonin  4.5 mg Oral QHS  . pantoprazole  40 mg Oral Daily  . tamsulosin  0.4 mg Oral QPC breakfast   acetaminophen **OR** acetaminophen, albuterol, bisacodyl, camphor-menthol, diphenhydrAMINE-zinc acetate, hydrOXYzine, methocarbamol, polyethylene glycol, simethicone, sorbitol, traMADol  Assessment/ Plan:   End-stage renal disease dialysis schedule has been changed to Monday Wednesday Friday dialysis at [facility] he will continue dialysis treatments 03/13/2019 to getting back on schedule.  He will need to be stronger and be able to sit for his dialysis treatment today reclining chair.  He will need to do this prior to being able to be discharged.  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

## 2019-03-13 NOTE — Progress Notes (Signed)
[place] PHYSICAL MEDICINE & REHABILITATION PROGRESS NOTE   Subjective/Complaints: No new issues. Has concerns about HD/transferring to chair. Ongoing RLE discomfort. Low grade temp  ROS: Patient denies fever, rash, sore throat, blurred vision, nausea, vomiting, diarrhea, cough, shortness of breath or chest pain,   headache, or mood change.    Objective:   No results found. Recent Labs    03/11/19 0645  WBC 13.0*  HGB 7.5*  HCT 25.0*  PLT 333   Recent Labs    03/12/19 0712 03/13/19 0716  NA 131* 130*  K 4.2 4.6  CL 93* 92*  CO2 26 24  GLUCOSE 129* 114*  BUN 37* 59*  CREATININE 5.16* 6.79*  CALCIUM 8.5* 8.5*    Intake/Output Summary (Last 24 hours) at 03/13/2019 0850 Last data filed at 03/13/2019 0830 Gross per 24 hour  Intake 640 ml  Output -  Net 640 ml     Physical Exam: Vital Signs Blood pressure 130/86, pulse 97, temperature 99.5 F (37.5 C), temperature source Oral, resp. rate 20, height 5\' 10"  (1.778 m), weight 122.8 kg, SpO2 98 %. Constitutional: No distress . Vital signs reviewed. HEENT: EOMI, oral membranes moist Neck: supple Cardiovascular: RRR without murmur. No JVD    Respiratory: CTA Bilaterally without wheezes or rales. Normal effort    GI: BS +, non-tender, non-distended   Skin: RLE with chronic areas of eschar, sl drainage.  Uro: scrotum swelling  improved Left BKA healed. RLE edema essentially resolved Psych: Normal mood.  Normal behavior. Musc:  . Right thigh with sq, indurated area still esp medially. Right leg really was not overly sensitive to touch Psych: anxious Neurological: Alert. Motor:  LLE: 4-4+/5 proximal to distal, no changes Right lower extremity: Hip flexion, knee extension 2/5---distally 3/5==decreased still somewhat d/t pain--no change  Assessment/Plan: 1. Functional deficits secondary to debility which require 3+ hours per day of interdisciplinary therapy in a comprehensive inpatient rehab setting.  Physiatrist is  providing close team supervision and 24 hour management of active medical problems listed below.  Physiatrist and rehab team continue to assess barriers to discharge/monitor patient progress toward functional and medical goals  Care Tool:  Bathing    Body parts bathed by patient: Right arm, Left arm, Chest, Abdomen, Face, Front perineal area, Right upper leg, Left upper leg   Body parts bathed by helper: Buttocks, Right lower leg Body parts n/a: Left lower leg   Bathing assist Assist Level: Minimal Assistance - Patient > 75%     Upper Body Dressing/Undressing Upper body dressing Upper body dressing/undressing activity did not occur (including orthotics): N/A(patient already had a gown) What is the patient wearing?: Hospital gown only    Upper body assist Assist Level: Set up assist    Lower Body Dressing/Undressing Lower body dressing    Lower body dressing activity did not occur: Refused What is the patient wearing?: Hospital gown only, Incontinence brief     Lower body assist Assist for lower body dressing: Moderate Assistance - Patient 50 - 74%     Toileting Toileting Toileting Activity did not occur Landscape architect and hygiene only): Safety/medical concerns  Toileting assist Assist for toileting: Dependent - Patient 0%     Transfers Chair/bed transfer  Transfers assist  Chair/bed transfer activity did not occur: Safety/medical concerns  Chair/bed transfer assist level: Moderate Assistance - Patient 50 - 74%     Locomotion Ambulation   Ambulation assist   Ambulation activity did not occur: Safety/medical concerns(unable due to pain and scrotal  and bladder issues)          Walk 10 feet activity   Assist  Walk 10 feet activity did not occur: Safety/medical concerns(unable due to pain and scrotal and bladder issues)        Walk 50 feet activity   Assist Walk 50 feet with 2 turns activity did not occur: Safety/medical concerns(unable due to  pain and scrotal and bladder issues)         Walk 150 feet activity   Assist Walk 150 feet activity did not occur: Safety/medical concerns(unable due to pain and scrotal and bladder issues)         Walk 10 feet on uneven surface  activity   Assist Walk 10 feet on uneven surfaces activity did not occur: Safety/medical concerns(unable due to pain and scrotal and bladder issues)         Wheelchair     Assist Will patient use wheelchair at discharge?: Yes Type of Wheelchair: Manual Wheelchair activity did not occur: Safety/medical concerns(unable due to pain and scrotal and bladder issues)  Wheelchair assist level: Supervision/Verbal cueing Max wheelchair distance: 40'    Wheelchair 50 feet with 2 turns activity    Assist    Wheelchair 50 feet with 2 turns activity did not occur: Safety/medical concerns(unable due to pain and scrotal and bladder issues)       Wheelchair 150 feet activity     Assist Wheelchair 150 feet activity did not occur: Safety/medical concerns(unable due to pain and scrotal and bladder issues)        Medical Problem List and Plan: 1.Debilitysecondary to end-stage renal disease/multi-medical. Hemodialysis recently initiated.     -SNF placement today?.  HD MWF sched. Has to be able to tolerate/transfer to HD chair. Can get IV vanc in outpt HD  -follow up with SW re: plan 2. Antithrombotics: -DVT/anticoagulation:Subcutaneous heparin -antiplatelet therapy: N/A 3. Pain Management:Neurontin 300 mg nightly---at max given ESRD  -Ultram as needed  -continue low dose robaxin prn   - pt to continue working on ROM on own and with therapy 4. Mood:Celexa 40 mg daily -antipsychotic agents: N/A  -needs daily ego support 5. Neuropsych: This patientiscapable of making decisions on hisown behalf.  Discussed with neuropsychology-appreciate recs, no SI 6. Skin/Wound Care:Scrotal sling Local care  to bullae--examine today when RN changes dressings  Appreciate Vasc recs - nonvascular RLE wounds  Appreciate further WOC recs    bx: "necrotic tissue with acute inflammation"  7. Fluids/Electrolytes/Nutrition:Routine in and outs 8.  ESRD:. Status post AV fistula 01/09/2019.   Filed Weights   03/11/19 1905 03/12/19 0545 03/13/19 0410  Weight: 118.5 kg 119.5 kg 122.8 kg   Weights stable  -MWF HD 9. Diabetes mellitus. Recent hemoglobin A1c 5.0.   Controlled    Continue to monitor 10. Anemia of chronic disease. Continue Aranesp  Hemoglobin 8.0 on 8/11, recs per nephro, labs with HD 11. History of left BKA 5 years ago in [place].   12. Hypertension. Norvasc 10 mg daily.    Fair control 8/21 13. Morbid obesity. BMI 43.78. Dietary follow-up 14. Hyperlipidemia. Lipitor 15. History of syphilis. Treated outpatient with penicillin to the healthcare department recently. Outpatient follow-up 16. Urine retention:    -now voiding, usually incontinently with bm  -flomax to help with emptying, urecholine 25mg  tid   -check ua ucx  17.  Sleep disturbance  Melatonin 4.5 mg nightly   Cont CPAP 18.  Low grade temp. Enterococcus UTI  -on IV vanc with HD  -  temp 99 19.  Constipation   moving bowels daily  - miralax prn 20.  Pruritus--generally improved  Local care, air to back  Benadryl   Added sarna and eucerin with good results      LOS: 33 days A FACE TO [place] 03/13/2019, 8:50 AM

## 2019-03-13 NOTE — Plan of Care (Signed)
  Problem: Consults Goal: RH GENERAL PATIENT EDUCATION Description: See Patient Education module for education specifics. Outcome: Progressing   Problem: RH BOWEL ELIMINATION Goal: RH STG MANAGE BOWEL WITH ASSISTANCE Description: STG Manage Bowel with mod Assistance. Outcome: Progressing   Problem: RH SKIN INTEGRITY Goal: RH STG SKIN FREE OF INFECTION/BREAKDOWN Description: Min assist Outcome: Progressing Goal: RH STG MAINTAIN SKIN INTEGRITY WITH ASSISTANCE Description: STG Maintain Skin Integrity With min Assistance. Outcome: Progressing   Problem: RH SAFETY Goal: RH STG ADHERE TO SAFETY PRECAUTIONS W/ASSISTANCE/DEVICE Description: STG Adhere to Safety Precautions With min Assistance/Device. Outcome: Progressing   Problem: RH PAIN MANAGEMENT Goal: RH STG PAIN MANAGED AT OR BELOW PT'S PAIN GOAL Description: Less than 4 on 0-10 scale Outcome: Not Progressing pain level above 7

## 2019-03-13 NOTE — Progress Notes (Signed)
Physical Therapy Session Note  Patient Details  Name: Michael Riggs MRN: [id number] Date of Birth: June 29, 1971  Today's Date: 03/13/2019     Short Term Goals:  Week 3:  PT Short Term Goal 1 (Week 3): =LTGs due to ELOS  Skilled Therapeutic Interventions/Progress Updates:  Pt gone from floor; to HD.  Pt missed 30 min PT tx.     Therapy Documentation Precautions:  Precautions Precautions: Fall Precaution Comments: L BKA with prosthesis; significant scrotal edema Restrictions Weight Bearing Restrictions: No           Therapy/Group: Individual Therapy  Wava Kildow 03/13/2019, 10:16 AM

## 2019-03-13 NOTE — Progress Notes (Signed)
Renal Navigator received call from CIR CSW/L. [name] stating that patient did not receive HD in recliner over the weekend as we discussed with team on Friday. Renal Navigator spoke with Charge RN in HD unit this morning to request that patient be in recliner for HD today to see if he can tolerate. Renal Navigator again stressed to team that patient needs to be able to tolerate HD in chair in order to be appropriate for discharge/treatment in OP HD clinic. Renal Navigator discussed with Dr. Webb/Nephrologist.  [name], [name], [name] Renal Navigator [phone]

## 2019-03-13 NOTE — Progress Notes (Addendum)
Occupational Therapy Session Note  Patient Details  Name: Michael Riggs MRN: [id number] Date of Birth: 1971/07/11  Today's Date: 03/13/2019 OT Individual Time: [id number] OT Individual Time Calculation (min): 30 min  and Today's Date: 03/13/2019 OT Missed Time: 30 Minutes Missed Time Reason: Patient unwilling/refused to participate without medical reason   Short Term Goals: Week 5:  OT Short Term Goal 1 (Week 5): STG=LTG 2/2 ELOS  Skilled Therapeutic Interventions/Progress Updates:    Pt resting in bed upon arrival.  Pt verbalized understanding of plan to don prosthesis and transfer to w/c for dialysis.  Pt required tot A for donning LLE prosthesis once seated EOB.  Pt supine>sit EOB with HOB elevated and use of bed rails. Pt required mod A for sit<>stand from EOB and CGA for stand pivot transfer to w/c with RW. Pt stated he wasn't going to do anything else because he was waiting to dialysis to arrive.  Pt remained seated in w/c with all needs within reach and table placed in front.  Pt missed 30 mins skilled OT services.   Therapy Documentation Precautions:  Precautions Precautions: Fall Precaution Comments: L BKA with prosthesis; significant scrotal edema Restrictions Weight Bearing Restrictions: No General: General OT Amount of Missed Time: 30 Minutes   Pain: Pain Assessment Pain Scale: 0-10 Pain Score: 9  Pain Type: Chronic pain Pain Location: Leg Pain Orientation: Right Pain Descriptors / Indicators: Aching Pain Frequency: Constant Pain Onset: On-going Patients Stated Pain Goal: 6 Pain Intervention(s): repositioned  Therapy/Group: Individual Therapy  Leroy Libman 03/13/2019, 10:42 AM

## 2019-03-14 ENCOUNTER — Inpatient Hospital Stay (HOSPITAL_COMMUNITY): Payer: Medicare Other | Admitting: Physical Therapy

## 2019-03-14 ENCOUNTER — Inpatient Hospital Stay (HOSPITAL_COMMUNITY): Payer: Medicare Other | Admitting: Occupational Therapy

## 2019-03-14 DIAGNOSIS — M2550 Pain in unspecified joint: Secondary | ICD-10-CM | POA: Diagnosis not present

## 2019-03-14 DIAGNOSIS — M6281 Muscle weakness (generalized): Secondary | ICD-10-CM | POA: Diagnosis not present

## 2019-03-14 DIAGNOSIS — N5089 Other specified disorders of the male genital organs: Secondary | ICD-10-CM | POA: Insufficient documentation

## 2019-03-14 DIAGNOSIS — R4182 Altered mental status, unspecified: Secondary | ICD-10-CM | POA: Diagnosis not present

## 2019-03-14 DIAGNOSIS — N2581 Secondary hyperparathyroidism of renal origin: Secondary | ICD-10-CM | POA: Diagnosis not present

## 2019-03-14 DIAGNOSIS — K5900 Constipation, unspecified: Secondary | ICD-10-CM | POA: Diagnosis not present

## 2019-03-14 DIAGNOSIS — D638 Anemia in other chronic diseases classified elsewhere: Secondary | ICD-10-CM | POA: Diagnosis not present

## 2019-03-14 DIAGNOSIS — N179 Acute kidney failure, unspecified: Secondary | ICD-10-CM | POA: Diagnosis not present

## 2019-03-14 DIAGNOSIS — E46 Unspecified protein-calorie malnutrition: Secondary | ICD-10-CM | POA: Insufficient documentation

## 2019-03-14 DIAGNOSIS — R0902 Hypoxemia: Secondary | ICD-10-CM | POA: Diagnosis not present

## 2019-03-14 DIAGNOSIS — E1151 Type 2 diabetes mellitus with diabetic peripheral angiopathy without gangrene: Secondary | ICD-10-CM | POA: Diagnosis not present

## 2019-03-14 DIAGNOSIS — Z515 Encounter for palliative care: Secondary | ICD-10-CM | POA: Diagnosis not present

## 2019-03-14 DIAGNOSIS — M79661 Pain in right lower leg: Secondary | ICD-10-CM | POA: Diagnosis not present

## 2019-03-14 DIAGNOSIS — L03115 Cellulitis of right lower limb: Secondary | ICD-10-CM | POA: Diagnosis not present

## 2019-03-14 DIAGNOSIS — T148XXA Other injury of unspecified body region, initial encounter: Secondary | ICD-10-CM | POA: Diagnosis not present

## 2019-03-14 DIAGNOSIS — R6511 Systemic inflammatory response syndrome (SIRS) of non-infectious origin with acute organ dysfunction: Secondary | ICD-10-CM | POA: Diagnosis not present

## 2019-03-14 DIAGNOSIS — R05 Cough: Secondary | ICD-10-CM | POA: Diagnosis not present

## 2019-03-14 DIAGNOSIS — E872 Acidosis: Secondary | ICD-10-CM | POA: Diagnosis not present

## 2019-03-14 DIAGNOSIS — E875 Hyperkalemia: Secondary | ICD-10-CM | POA: Diagnosis not present

## 2019-03-14 DIAGNOSIS — E1165 Type 2 diabetes mellitus with hyperglycemia: Secondary | ICD-10-CM | POA: Diagnosis not present

## 2019-03-14 DIAGNOSIS — Z20828 Contact with and (suspected) exposure to other viral communicable diseases: Secondary | ICD-10-CM | POA: Diagnosis present

## 2019-03-14 DIAGNOSIS — R509 Fever, unspecified: Secondary | ICD-10-CM | POA: Diagnosis not present

## 2019-03-14 DIAGNOSIS — R52 Pain, unspecified: Secondary | ICD-10-CM | POA: Diagnosis not present

## 2019-03-14 DIAGNOSIS — D689 Coagulation defect, unspecified: Secondary | ICD-10-CM | POA: Diagnosis not present

## 2019-03-14 DIAGNOSIS — R262 Difficulty in walking, not elsewhere classified: Secondary | ICD-10-CM | POA: Diagnosis not present

## 2019-03-14 DIAGNOSIS — D509 Iron deficiency anemia, unspecified: Secondary | ICD-10-CM | POA: Diagnosis not present

## 2019-03-14 DIAGNOSIS — Z992 Dependence on renal dialysis: Secondary | ICD-10-CM | POA: Diagnosis not present

## 2019-03-14 DIAGNOSIS — R5381 Other malaise: Secondary | ICD-10-CM | POA: Diagnosis not present

## 2019-03-14 DIAGNOSIS — N186 End stage renal disease: Secondary | ICD-10-CM | POA: Diagnosis not present

## 2019-03-14 DIAGNOSIS — R278 Other lack of coordination: Secondary | ICD-10-CM | POA: Diagnosis not present

## 2019-03-14 DIAGNOSIS — L988 Other specified disorders of the skin and subcutaneous tissue: Secondary | ICD-10-CM | POA: Diagnosis not present

## 2019-03-14 DIAGNOSIS — I120 Hypertensive chronic kidney disease with stage 5 chronic kidney disease or end stage renal disease: Secondary | ICD-10-CM | POA: Diagnosis not present

## 2019-03-14 DIAGNOSIS — Z23 Encounter for immunization: Secondary | ICD-10-CM | POA: Diagnosis not present

## 2019-03-14 DIAGNOSIS — G253 Myoclonus: Secondary | ICD-10-CM | POA: Diagnosis not present

## 2019-03-14 DIAGNOSIS — E662 Morbid (severe) obesity with alveolar hypoventilation: Secondary | ICD-10-CM | POA: Diagnosis present

## 2019-03-14 DIAGNOSIS — R531 Weakness: Secondary | ICD-10-CM | POA: Diagnosis not present

## 2019-03-14 DIAGNOSIS — M79604 Pain in right leg: Secondary | ICD-10-CM | POA: Diagnosis not present

## 2019-03-14 DIAGNOSIS — R269 Unspecified abnormalities of gait and mobility: Secondary | ICD-10-CM | POA: Diagnosis not present

## 2019-03-14 DIAGNOSIS — D631 Anemia in chronic kidney disease: Secondary | ICD-10-CM | POA: Diagnosis not present

## 2019-03-14 DIAGNOSIS — I469 Cardiac arrest, cause unspecified: Secondary | ICD-10-CM | POA: Diagnosis not present

## 2019-03-14 DIAGNOSIS — Z7189 Other specified counseling: Secondary | ICD-10-CM | POA: Diagnosis not present

## 2019-03-14 DIAGNOSIS — G8929 Other chronic pain: Secondary | ICD-10-CM | POA: Diagnosis not present

## 2019-03-14 DIAGNOSIS — K219 Gastro-esophageal reflux disease without esophagitis: Secondary | ICD-10-CM | POA: Diagnosis not present

## 2019-03-14 DIAGNOSIS — J90 Pleural effusion, not elsewhere classified: Secondary | ICD-10-CM | POA: Diagnosis not present

## 2019-03-14 DIAGNOSIS — J9601 Acute respiratory failure with hypoxia: Secondary | ICD-10-CM | POA: Diagnosis not present

## 2019-03-14 DIAGNOSIS — R61 Generalized hyperhidrosis: Secondary | ICD-10-CM | POA: Diagnosis not present

## 2019-03-14 DIAGNOSIS — L299 Pruritus, unspecified: Secondary | ICD-10-CM | POA: Diagnosis not present

## 2019-03-14 DIAGNOSIS — E119 Type 2 diabetes mellitus without complications: Secondary | ICD-10-CM | POA: Diagnosis not present

## 2019-03-14 DIAGNOSIS — R0989 Other specified symptoms and signs involving the circulatory and respiratory systems: Secondary | ICD-10-CM | POA: Insufficient documentation

## 2019-03-14 DIAGNOSIS — B952 Enterococcus as the cause of diseases classified elsewhere: Secondary | ICD-10-CM | POA: Diagnosis not present

## 2019-03-14 DIAGNOSIS — R609 Edema, unspecified: Secondary | ICD-10-CM | POA: Diagnosis not present

## 2019-03-14 DIAGNOSIS — E1121 Type 2 diabetes mellitus with diabetic nephropathy: Secondary | ICD-10-CM | POA: Diagnosis not present

## 2019-03-14 DIAGNOSIS — G92 Toxic encephalopathy: Secondary | ICD-10-CM | POA: Diagnosis not present

## 2019-03-14 DIAGNOSIS — R197 Diarrhea, unspecified: Secondary | ICD-10-CM | POA: Diagnosis not present

## 2019-03-14 DIAGNOSIS — J9811 Atelectasis: Secondary | ICD-10-CM | POA: Diagnosis not present

## 2019-03-14 DIAGNOSIS — E1122 Type 2 diabetes mellitus with diabetic chronic kidney disease: Secondary | ICD-10-CM | POA: Diagnosis not present

## 2019-03-14 DIAGNOSIS — Z89512 Acquired absence of left leg below knee: Secondary | ICD-10-CM | POA: Diagnosis not present

## 2019-03-14 DIAGNOSIS — R339 Retention of urine, unspecified: Secondary | ICD-10-CM | POA: Diagnosis not present

## 2019-03-14 DIAGNOSIS — N390 Urinary tract infection, site not specified: Secondary | ICD-10-CM | POA: Diagnosis not present

## 2019-03-14 DIAGNOSIS — G4730 Sleep apnea, unspecified: Secondary | ICD-10-CM | POA: Diagnosis not present

## 2019-03-14 DIAGNOSIS — L089 Local infection of the skin and subcutaneous tissue, unspecified: Secondary | ICD-10-CM | POA: Diagnosis not present

## 2019-03-14 DIAGNOSIS — I10 Essential (primary) hypertension: Secondary | ICD-10-CM | POA: Diagnosis not present

## 2019-03-14 DIAGNOSIS — I517 Cardiomegaly: Secondary | ICD-10-CM | POA: Diagnosis not present

## 2019-03-14 DIAGNOSIS — E43 Unspecified severe protein-calorie malnutrition: Secondary | ICD-10-CM | POA: Diagnosis not present

## 2019-03-14 DIAGNOSIS — E113599 Type 2 diabetes mellitus with proliferative diabetic retinopathy without macular edema, unspecified eye: Secondary | ICD-10-CM | POA: Diagnosis not present

## 2019-03-14 DIAGNOSIS — F05 Delirium due to known physiological condition: Secondary | ICD-10-CM | POA: Diagnosis not present

## 2019-03-14 DIAGNOSIS — N185 Chronic kidney disease, stage 5: Secondary | ICD-10-CM | POA: Diagnosis not present

## 2019-03-14 DIAGNOSIS — Z7401 Bed confinement status: Secondary | ICD-10-CM | POA: Diagnosis not present

## 2019-03-14 DIAGNOSIS — R279 Unspecified lack of coordination: Secondary | ICD-10-CM | POA: Diagnosis not present

## 2019-03-14 DIAGNOSIS — E785 Hyperlipidemia, unspecified: Secondary | ICD-10-CM | POA: Diagnosis not present

## 2019-03-14 DIAGNOSIS — A4181 Sepsis due to Enterococcus: Secondary | ICD-10-CM | POA: Diagnosis not present

## 2019-03-14 DIAGNOSIS — R6521 Severe sepsis with septic shock: Secondary | ICD-10-CM | POA: Diagnosis not present

## 2019-03-14 DIAGNOSIS — R0602 Shortness of breath: Secondary | ICD-10-CM | POA: Diagnosis not present

## 2019-03-14 LAB — CBC
HCT: 22.5 % — ABNORMAL LOW (ref 39.0–52.0)
Hemoglobin: 6.9 g/dL — CL (ref 13.0–17.0)
MCH: 27.8 pg (ref 26.0–34.0)
MCHC: 30.7 g/dL (ref 30.0–36.0)
MCV: 90.7 fL (ref 80.0–100.0)
Platelets: 323 10*3/uL (ref 150–400)
RBC: 2.48 MIL/uL — ABNORMAL LOW (ref 4.22–5.81)
RDW: 14.6 % (ref 11.5–15.5)
WBC: 10.6 10*3/uL — ABNORMAL HIGH (ref 4.0–10.5)
nRBC: 0 % (ref 0.0–0.2)

## 2019-03-14 LAB — RENAL FUNCTION PANEL
Albumin: 1.9 g/dL — ABNORMAL LOW (ref 3.5–5.0)
Anion gap: 11 (ref 5–15)
BUN: 37 mg/dL — ABNORMAL HIGH (ref 6–20)
CO2: 26 mmol/L (ref 22–32)
Calcium: 8.2 mg/dL — ABNORMAL LOW (ref 8.9–10.3)
Chloride: 93 mmol/L — ABNORMAL LOW (ref 98–111)
Creatinine, Ser: 4.64 mg/dL — ABNORMAL HIGH (ref 0.61–1.24)
GFR calc Af Amer: 16 mL/min — ABNORMAL LOW (ref >60)
GFR calc non Af Amer: 14 mL/min — ABNORMAL LOW (ref >60)
Glucose, Bld: 134 mg/dL — ABNORMAL HIGH (ref 70–99)
Phosphorus: 4.1 mg/dL (ref 2.5–4.6)
Potassium: 3.6 mmol/L (ref 3.5–5.1)
Sodium: 130 mmol/L — ABNORMAL LOW (ref 135–145)

## 2019-03-14 NOTE — Progress Notes (Signed)
Occupational Therapy Discharge Summary  Patient Details  Name: Michael Riggs MRN: [id number] Date of Birth: October 28, 1970  Today's Date: 03/14/2019    Session Note:  Pt declined therapy this am stating "I'm leaving today so you don't need to see me, and I'm eating breakfast anyway."  Pt told that he would likely not leave until late morning or afternoon and that it wouldn't be soon.  He still declined stating he wanted to be "fresh" for his trip.  Pt left with call button and phone in reach, he missed 30 mins OT secondary to refusal.  Patient has met 3 of 3 long term goals due to improved activity tolerance, improved balance and postural control.  Patient to discharge at [facility] Assist level.  Patient's care partner unavailable to provide the necessary physical assistance at discharge.    Reasons goals not met: NA  Recommendation:  Patient will benefit from ongoing skilled OT services in skilled nursing facility setting to continue to advance functional skills in the area of BADL and Reduce care partner burden.  Pt still with increased pain in his right and left thighs which limit his ability to participate actively and complete OOB tasks or functional transfers.  Recommend continued OT secondary to pt currently needing min assist or more for basic selfcare tasks.    Equipment: No equipment provided  Reasons for discharge: treatment goals met and discharge from hospital  Patient/family agrees with progress made and goals achieved: Yes  OT Discharge Precautions/Restrictions  Precautions Precautions: Fall Restrictions Weight Bearing Restrictions: No General OT Amount of Missed Time: 30 Minutes  Pain Pain Assessment Pain Scale: Faces Pain Score: Asleep Faces Pain Scale: Hurts a little bit Pain Type: Acute pain Pain Location: Leg Pain Orientation: Right Pain Descriptors / Indicators: Discomfort Pain Onset: Unable to tell Pain Intervention(s): Emotional  support ADL ADL Eating: Independent Where Assessed-Eating: Bed level Grooming: Independent Where Assessed-Grooming: Wheelchair Upper Body Bathing: Setup Where Assessed-Upper Body Bathing: Edge of bed Lower Body Bathing: Minimal assistance Where Assessed-Lower Body Bathing: Bed level, Edge of bed Upper Body Dressing: Setup Where Assessed-Upper Body Dressing: Edge of bed, Wheelchair Lower Body Dressing: Maximal assistance Where Assessed-Lower Body Dressing: Bed level Toileting: Maximal assistance Where Assessed-Toileting: Bed level Toilet Transfer: Moderate assistance Toilet Transfer Method: Stand pivot Toilet Transfer Equipment: Bedside commode Vision Baseline Vision/History: Wears glasses Wears Glasses: At all times Vision Assessment?: No apparent visual deficits Perception  Perception: Within Functional Limits Praxis Praxis: Intact Cognition Overall Cognitive Status: Within Functional Limits for tasks assessed Orientation Level: Oriented X4 Memory: Appears intact Safety/Judgment: Appears intact Sensation Sensation Light Touch: Appears Intact(sensation intact in BUEs) Hot/Cold: Appears Intact Proprioception: Appears Intact Stereognosis: Appears Intact Coordination Gross Motor Movements are Fluid and Coordinated: Yes(coordination WFLS in BUEs) Fine Motor Movements are Fluid and Coordinated: Yes Motor  Motor Motor: Within Functional Limits Motor - Discharge Observations: Generalized weakness throughout Mobility  Bed Mobility Bed Mobility: Rolling Right;Rolling Left;Right Sidelying to Sit Rolling Right: Supervision/verbal cueing Rolling Left: Supervision/Verbal cueing Right Sidelying to Sit: Supervision/Verbal cueing  Trunk/Postural Assessment  Cervical Assessment Cervical Assessment: Within Functional Limits Thoracic Assessment Thoracic Assessment: Exceptions to WFL(rounded posture) Lumbar Assessment Lumbar Assessment: Within Functional Limits Postural  Control Postural Control: Within Functional Limits  Balance Balance Balance Assessed: Yes Static Sitting Balance Static Sitting - Balance Support: Bilateral upper extremity supported Static Sitting - Level of Assistance: 5: Stand by assistance Dynamic Sitting Balance Dynamic Sitting - Balance Support: Bilateral upper extremity supported Dynamic Sitting - Level of  Assistance: 5: Stand by assistance Extremity/Trunk Assessment RUE Assessment RUE Assessment: Within Functional Limits LUE Assessment LUE Assessment: Within Functional Limits   [name] OTR/L 03/14/2019, 8:19 AM

## 2019-03-14 NOTE — Significant Event (Signed)
CRITICAL VALUE ALERT  Critical Value: Hgb  6.9  Date & Time Notied:  03/14/19 [id number]  Provider Notified: Linna Hoff Angiulli-PA  Orders Received/Actions taken: Will follow up with Dr. Naaman Plummer this am. No new orders noted at this time.

## 2019-03-14 NOTE — Progress Notes (Signed)
Report was given to [place] at nursing facility.Family got pt's belongings.Pt. ready to be transfer.

## 2019-03-14 NOTE — Progress Notes (Signed)
[facility] PHYSICAL MEDICINE & REHABILITATION PROGRESS NOTE   Subjective/Complaints: Eating breakfast. Won't get up for therapy because he's leaving. No issues with HD yesterday  ROS: Patient denies fever, rash, sore throat, blurred vision, nausea, vomiting, diarrhea, cough, shortness of breath or chest pain,  headache, or mood change.     Objective:   No results found. Recent Labs    03/13/19 1256 03/14/19 0611  WBC 12.0* 10.6*  HGB 7.0* 6.9*  HCT 23.5* 22.5*  PLT 357 323   Recent Labs    03/13/19 0716 03/14/19 0611  NA 130* 130*  K 4.6 3.6  CL 92* 93*  CO2 24 26  GLUCOSE 114* 134*  BUN 59* 37*  CREATININE 6.79* 4.64*  CALCIUM 8.5* 8.2*    Intake/Output Summary (Last 24 hours) at 03/14/2019 0823 Last data filed at 03/14/2019 0549 Gross per 24 hour  Intake 960 ml  Output 3300 ml  Net -2340 ml     Physical Exam: Vital Signs Blood pressure 136/75, pulse 93, temperature 98.6 F (37 C), temperature source Oral, resp. rate 19, height 5\' 10"  (1.778 m), weight 120.6 kg, SpO2 94 %. Constitutional: No distress . Vital signs reviewed. HEENT: EOMI, oral membranes moist Neck: supple Cardiovascular: RRR without murmur. No JVD    Respiratory: CTA Bilaterally without wheezes or rales. Normal effort    GI: BS +, non-tender, non-distended   Skin: RLE with chronic areas of eschar, areas drying. No draiange Uro: scrotum swelling resolved  Left BKA healed. RLE edema essentially resolved Psych: Normal mood.  Normal behavior. Musc:  . Right thigh with sq, indurated area still esp medially. Right leg really was not overly sensitive to touch Psych: anxious Neurological: Alert. Motor:  LLE: 4-4+/5 proximal to distal, no changes Right lower extremity: Hip flexion, knee extension 2/5---distally 3/5==decreased still somewhat d/t pain--stable  Assessment/Plan: 1. Functional deficits secondary to debility which require 3+ hours per day of interdisciplinary therapy in a comprehensive  inpatient rehab setting.  Physiatrist is providing close team supervision and 24 hour management of active medical problems listed below.  Physiatrist and rehab team continue to assess barriers to discharge/monitor patient progress toward functional and medical goals  Care Tool:  Bathing    Body parts bathed by patient: Right arm, Left arm, Chest, Abdomen, Face, Front perineal area, Right upper leg, Left upper leg, Right lower leg   Body parts bathed by helper: Left lower leg, Buttocks Body parts n/a: Front perineal area, Buttocks, Right upper leg, Left lower leg, Right lower leg, Left upper leg   Bathing assist Assist Level: Minimal Assistance - Patient > 75%     Upper Body Dressing/Undressing Upper body dressing Upper body dressing/undressing activity did not occur (including orthotics): N/A(patient already had a gown) What is the patient wearing?: Hospital gown only    Upper body assist Assist Level: Set up assist    Lower Body Dressing/Undressing Lower body dressing    Lower body dressing activity did not occur: Refused What is the patient wearing?: Hospital gown only, Incontinence brief     Lower body assist Assist for lower body dressing: Maximal Assistance - Patient 25 - 49%     Toileting Toileting Toileting Activity did not occur (Clothing management and hygiene only): Safety/medical concerns  Toileting assist Assist for toileting: Maximal Assistance - Patient 25 - 49%     Transfers Chair/bed transfer  Transfers assist  Chair/bed transfer activity did not occur: Safety/medical concerns  Chair/bed transfer assist level: Moderate Assistance - Patient 50 -  74%     Locomotion Ambulation   Ambulation assist   Ambulation activity did not occur: Safety/medical concerns(unable due to pain and scrotal and bladder issues)          Walk 10 feet activity   Assist  Walk 10 feet activity did not occur: Safety/medical concerns(unable due to pain and scrotal  and bladder issues)        Walk 50 feet activity   Assist Walk 50 feet with 2 turns activity did not occur: Safety/medical concerns(unable due to pain and scrotal and bladder issues)         Walk 150 feet activity   Assist Walk 150 feet activity did not occur: Safety/medical concerns(unable due to pain and scrotal and bladder issues)         Walk 10 feet on uneven surface  activity   Assist Walk 10 feet on uneven surfaces activity did not occur: Safety/medical concerns(unable due to pain and scrotal and bladder issues)         Wheelchair     Assist Will patient use wheelchair at discharge?: Yes Type of Wheelchair: Manual Wheelchair activity did not occur: Safety/medical concerns(unable due to pain and scrotal and bladder issues)  Wheelchair assist level: Supervision/Verbal cueing Max wheelchair distance: 40'    Wheelchair 50 feet with 2 turns activity    Assist    Wheelchair 50 feet with 2 turns activity did not occur: Safety/medical concerns(unable due to pain and scrotal and bladder issues)       Wheelchair 150 feet activity     Assist Wheelchair 150 feet activity did not occur: Safety/medical concerns(unable due to pain and scrotal and bladder issues)        Medical Problem List and Plan: 1.Debilitysecondary to end-stage renal disease/multi-medical. Hemodialysis recently initiated.     -SNF placement today   HD MWF sched. Has to be able to tolerate/transfer to HD chair. Can get IV vanc in outpt HD  -follow up with nephro, primary 2. Antithrombotics: -DVT/anticoagulation:Subcutaneous heparin -antiplatelet therapy: N/A 3. Pain Management:Neurontin 300 mg nightly---at max given ESRD  -Ultram as needed  -continue low dose robaxin prn   - pt to continue working on ROM on own and with therapy 4. Mood:Celexa 40 mg daily -antipsychotic agents: N/A  -needs daily ego support 5. Neuropsych: This  patientiscapable of making decisions on hisown behalf.  Discussed with neuropsychology-appreciate recs, no SI 6. Skin/Wound Care:Scrotal sling Local care to bullae--examine today when RN changes dressings  Appreciate Vasc recs - nonvascular RLE wounds  Appreciate further WOC recs    bx: "necrotic tissue with acute inflammation"   -areas examined and gradually improving 7. Fluids/Electrolytes/Nutrition:Routine in and outs 8.  ESRD:. Status post AV fistula 01/09/2019.   Filed Weights   03/12/19 0545 03/13/19 0410 03/14/19 0512  Weight: 119.5 kg 122.8 kg 120.6 kg   Weights stable  -MWF HD 9. Diabetes mellitus. Recent hemoglobin A1c 5.0.   Controlled    Continue to monitor 10. Anemia of chronic disease. Continue Aranesp  Hemoglobin 6.9 (7 yesterday)  -for some reason wasn't transfused in HD yesterday  -asymptomatic  -rec 1uPRBC with HD tomorrow 11. History of left BKA 5 years ago in [place].   12. Hypertension. Norvasc 10 mg daily.    Fair control 8/25 13. Morbid obesity. BMI 43.78. Dietary follow-up 14. Hyperlipidemia. Lipitor 15. History of syphilis. Treated outpatient with penicillin to the healthcare department recently. Outpatient follow-up 16. Urine retention:    -now voiding, usually incontinently with  bm  -flomax to help with emptying, urecholine 25mg  tid   -check ua ucx  17.  Sleep disturbance  Melatonin 4.5 mg nightly   Cont CPAP 18.  Low grade temp. Enterococcus UTI  -on IV vanc with HD  -currently afebrile 19.  Constipation   moving bowels daily  - miralax prn 20.  Pruritus--generally improved  Local care, air to back  Benadryl   Added sarna and eucerin with good results      LOS: 34 days A FACE TO [place] 03/14/2019, 8:23 AM

## 2019-03-14 NOTE — Progress Notes (Signed)
Social Work Patient ID: Bon Reason, male   DOB: 07-25-1970, 48 y.o.   MRN: [id number]   Let message for SNF yesterday about need to confirm they received pre-auth from insurance.  Reached out again this morning and they are trying to confirm in order for [place] to proceed with transfer today.  Will keep team posted.  Emmamarie Kluender, LCSW

## 2019-03-14 NOTE — Progress Notes (Signed)
Social Work Discharge Note   The overall goal for the admission was met for:   Discharge location: No - plan changed to SNF due to care needs  Length of Stay: No - extended LOS due to medical issues, poor tolerance for therapy and changing d/c plans.  Final LOS = 34 days  Discharge activity level: No - did not meet original goals of supervision w/c level  Home/community participation: No  Services provided included: MD, RD, PT, OT, RN, TR, Pharmacy, [place]: [employer] Medicare and Medicaid  Follow-up services arranged: Other: SNF @ [facility]  Comments (or additional information):    Contact info:: pt @ [phone]  Patient/Family verbalized understanding of follow-up arrangements: Yes  Individual responsible for coordination of the follow-up plan: pt  Confirmed correct DME delivered:  NA    Kyrstan Gotwalt

## 2019-03-14 NOTE — Progress Notes (Signed)
[facility] ROUNDING NOTE   Subjective:   This is a very pleasant 61 gentleman with a recent start to dialysis 01/31/2019 TTS schedule.  He is tentatively set up for discharge to [place] [facility] on a Monday Wednesday Friday dialysis schedule.  Unfortunately he has been weak and immobile and is now been sitting on dialysis.  He needs to do this prior to being able to proceed with outpatient dialysis treatments.  He has been receiving aggressive ultrafiltration.  His last dialysis treatment was 03/13/2019 with 3.3 L removed he has bladder outlet obstruction and edema of his genitalia.  Urology has been following.  He has a right lower extremity nonhealing wound with concern for calciphylaxis in the skin punch biopsy 02/22/2019 with no calciphylaxis noted.  He is to be discharged to SNF when appropriate.  Blood pressure 136/75 pulse 93 temperature 98.6 O2 sats 94% on room air  Sodium 130 potassium 3.6 chloride 93 CO2 26 BUN 37 creatinine 4.64 glucose 134 calcium 8.2 phosphorus 4.9 albumin 1.9 WBC 10.6 hemoglobin 6.9 platelets 323  Lipitor 40 mg daily calcitriol 0.25 mcg Monday Wednesday Friday, Celexa 40 mg daily, darbepoetin 150 mcg every Wednesday Lasix 80 mg daily, Neurontin 300 mg daily, Protonix 40 mg daily, Flomax 0.4 mg daily, ferrilicit 0000000 mg Monday Wednesday Friday x 5 doses, vancomycin dosing per pharmacy   Objective:  Vital signs in last 24 hours:  Temp:  [98.2 F (36.8 C)-99.9 F (37.7 C)] 98.6 F (37 C) (08/25 0512) Pulse Rate:  [78-96] 93 (08/25 0512) Resp:  [12-19] 19 (08/25 0512) BP: (108-143)/(57-87) 136/75 (08/25 0512) SpO2:  [94 %-96 %] 94 % (08/25 0512) Weight:  [120.6 kg] 120.6 kg (08/25 0512)  Weight change: -2.2 kg Filed Weights   03/12/19 0545 03/13/19 0410 03/14/19 0512  Weight: 119.5 kg 122.8 kg 120.6 kg    Intake/Output: I/O last 3 completed shifts: In: 960 [P.O.:960] Out: 3300 [Other:3300]   Intake/Output this shift:  No intake/output  data recorded.  General:  AAOx3 NAD  HEENT: MMM [place] AT anicteric sclera Neck:  Increased neck circumference  CV:  Heart RRR  Lungs:  Clear to auscultation bilaterally  Abd:  abd SNT/distended obese habitus  GU:  scrotal edema improving Extremities: Left BKA, trace to 1+ edema, right lower extremity wound bandaged 1+ edema; tender to palpation Skin:  No skin rash Access: RIJ tunneled catheter in place; LUE AVF with bruit and thrill    Basic Metabolic Panel: Recent Labs  Lab 03/10/19 0631 03/11/19 0645 03/12/19 0712 03/13/19 0716 03/14/19 0611  NA 129* 126* 131* 130* 130*  K 4.1 4.4 4.2 4.6 3.6  CL 93* 90* 93* 92* 93*  CO2 24 22 26 24 26   GLUCOSE 136* 123* 129* 114* 134*  BUN 32* 53* 37* 59* 37*  CREATININE 4.80* 6.64* 5.16* 6.79* 4.64*  CALCIUM 8.3* 8.3* 8.5* 8.5* 8.2*  PHOS 3.2 4.1 4.1 4.4 4.1    Liver Function Tests: Recent Labs  Lab 03/10/19 0631 03/11/19 0645 03/12/19 0712 03/13/19 0716 03/14/19 0611  ALBUMIN 1.9* 2.0* 2.0* 1.9* 1.9*   No results for input(s): LIPASE, AMYLASE in the last 168 hours. No results for input(s): AMMONIA in the last 168 hours.  CBC: Recent Labs  Lab 03/08/19 1410 03/11/19 0645 03/13/19 1256 03/14/19 0611  WBC 12.1* 13.0* 12.0* 10.6*  HGB 7.5* 7.5* 7.0* 6.9*  HCT 24.6* 25.0* 23.5* 22.5*  MCV 94.6 92.9 93.6 90.7  PLT 261 333 357 323    Cardiac Enzymes:  No results for input(s): CKTOTAL, CKMB, CKMBINDEX, TROPONINI in the last 168 hours.  BNP: Invalid input(s): POCBNP  CBG: Recent Labs  Lab 03/12/19 1611  GLUCAP 129*    Microbiology: Results for orders placed or performed during the hospital encounter of 02/08/19  Urine Culture     Status: Abnormal   Collection Time: 02/13/19  1:25 PM   Specimen: Urine, Random  Result Value Ref Range Status   Specimen Description URINE, RANDOM  Final   Special Requests NONE  Final   Culture (A)  Final    <10,000 COLONIES/mL INSIGNIFICANT GROWTH Performed at [facility], [place] [address] [place], [place] [address]    Report Status 02/14/2019 FINAL  Final  Urine Culture     Status: Abnormal   Collection Time: 03/10/19  8:36 AM   Specimen: Urine, Random  Result Value Ref Range Status   Specimen Description URINE, RANDOM  Final   Special Requests   Final    NONE Performed at [facility], [place] [address] [place], [place] [address]    Culture >=100,000 COLONIES/mL ENTEROCOCCUS FAECIUM (A)  Final   Report Status 03/12/2019 FINAL  Final   Organism ID, Bacteria ENTEROCOCCUS FAECIUM (A)  Final      Susceptibility   Enterococcus faecium - MIC*    AMPICILLIN >=32 RESISTANT Resistant     LEVOFLOXACIN 4 INTERMEDIATE Intermediate     NITROFURANTOIN 64 INTERMEDIATE Intermediate     VANCOMYCIN <=0.5 SENSITIVE Sensitive     * >=100,000 COLONIES/mL ENTEROCOCCUS FAECIUM  Culture, blood (routine x 2)     Status: None (Preliminary result)   Collection Time: 03/10/19  2:08 PM   Specimen: BLOOD  Result Value Ref Range Status   Specimen Description BLOOD RIGHT ANTECUBITAL  Final   Special Requests   Final    BOTTLES DRAWN AEROBIC AND ANAEROBIC Blood Culture adequate volume   Culture   Final    NO GROWTH 3 DAYS Performed at [facility], [address], [place], [place] [address]    Report Status PENDING  Incomplete  Culture, blood (routine x 2)     Status: None (Preliminary result)   Collection Time: 03/10/19  2:15 PM   Specimen: BLOOD RIGHT HAND  Result Value Ref Range Status   Specimen Description BLOOD RIGHT HAND  Final   Special Requests   Final    BOTTLES DRAWN AEROBIC ONLY Blood Culture results may not be optimal due to an inadequate volume of blood received in culture bottles   Culture   Final    NO GROWTH 3 DAYS Performed at [facility], [place] [address] [place], [place] [address]    Report Status PENDING  Incomplete  SARS CORONAVIRUS 2 Nasal Swab Aptima Multi Swab     Status: None   Collection Time: 03/13/19  8:14 AM   Specimen:  Aptima Multi Swab; Nasal Swab  Result Value Ref Range Status   SARS Coronavirus 2 NEGATIVE NEGATIVE Final    Comment: (NOTE) SARS-CoV-2 target nucleic acids are NOT DETECTED. The SARS-CoV-2 RNA is generally detectable in upper and lower respiratory specimens during the acute phase of infection. Negative results do not preclude SARS-CoV-2 infection, do not rule out co-infections with other pathogens, and should not be used as the sole basis for treatment or other patient management decisions. Negative results must be combined with clinical observations, patient history, and epidemiological information. The expected result is Negative. Fact Sheet for Patients: SugarRoll.be Fact Sheet  for Healthcare Providers: https://www.woods-mathews.com/ This test is not yet approved or cleared by the [place] and  has been authorized for detection and/or diagnosis of SARS-CoV-2 by FDA under an Emergency Use Authorization (EUA). This EUA will remain  in effect (meaning this test can be used) for the duration of the COVID-19 declaration under Section 56 4(b)(1) of the Act, 21 U.S.C. section 360bbb-3(b)(1), unless the authorization is terminated or revoked sooner. Performed at [facility], [place] [address] [place], [place] [address]     Coagulation Studies: No results for input(s): LABPROT, INR in the last 72 hours.  Urinalysis: No results for input(s): COLORURINE, LABSPEC, PHURINE, GLUCOSEU, HGBUR, BILIRUBINUR, KETONESUR, PROTEINUR, UROBILINOGEN, NITRITE, LEUKOCYTESUR in the last 72 hours.  Invalid input(s): APPERANCEUR    Imaging: No results found.   Medications:   . ferric gluconate (FERRLECIT/NULECIT) IV 125 mg (03/13/19 1310)  . vancomycin 1,000 mg (03/13/19 1435)   . sodium chloride   Intravenous Once  . sodium chloride   Intravenous Once  . atorvastatin  40 mg Oral Daily  . bethanechol  25 mg Oral TID  . calcitRIOL  0.25  mcg Oral Q M,W,F-1800  . Chlorhexidine Gluconate Cloth  6 each Topical Q0600  . citalopram  40 mg Oral Daily  . collagenase   Topical Daily  . [START ON 03/15/2019] darbepoetin (ARANESP) injection - DIALYSIS  150 mcg Intravenous Q Wed-HD  . furosemide  80 mg Oral Daily  . gabapentin  300 mg Oral QHS  . heparin  5,000 Units Subcutaneous Q8H  . hydrocerin   Topical BID  . lidocaine  20 mL Other Once  . lidocaine   Topical 5 X Daily  . Melatonin  4.5 mg Oral QHS  . pantoprazole  40 mg Oral Daily  . tamsulosin  0.4 mg Oral QPC breakfast   acetaminophen **OR** acetaminophen, albuterol, bisacodyl, camphor-menthol, diphenhydrAMINE-zinc acetate, hydrOXYzine, methocarbamol, polyethylene glycol, simethicone, sorbitol, traMADol  Assessment/ Plan:   End-stage renal disease dialysis schedule has been changed to Monday Wednesday Friday dialysis at [facility] kidney center he had a successful dialysis treatment.  With ultrafiltration of 3.3 L 03/13/2019.  He is ready for discharge from a renal standpoint.  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  As well as darbepoetin which was to be administered 03/15/2019.  He will receive iron and ESA at his dialysis unit  Disposition patient ready for discharge from renal standpoint   LOS: [place] @TODAY @8 :38 AM

## 2019-03-14 NOTE — Progress Notes (Signed)
Patient ready for discharge from renal standpoint per Dr. Webb/Nephrologist. Renal Navigator discussed with CIR CSW/L. Hoyle. Renal Navigator notified OP HD clinic/[place]. Patient to start in OP HD clinic tomorrow, 03/15/19. He needs to arrive at clinic at 8:30am to sign new consents prior to first HD treatment.   Alphonzo Cruise, [place] Renal Navigator [phone]

## 2019-03-15 DIAGNOSIS — N390 Urinary tract infection, site not specified: Secondary | ICD-10-CM | POA: Diagnosis not present

## 2019-03-15 DIAGNOSIS — R52 Pain, unspecified: Secondary | ICD-10-CM | POA: Diagnosis not present

## 2019-03-15 DIAGNOSIS — N2581 Secondary hyperparathyroidism of renal origin: Secondary | ICD-10-CM | POA: Diagnosis not present

## 2019-03-15 DIAGNOSIS — D689 Coagulation defect, unspecified: Secondary | ICD-10-CM | POA: Diagnosis not present

## 2019-03-15 DIAGNOSIS — N186 End stage renal disease: Secondary | ICD-10-CM | POA: Diagnosis not present

## 2019-03-15 DIAGNOSIS — G8929 Other chronic pain: Secondary | ICD-10-CM | POA: Diagnosis not present

## 2019-03-15 DIAGNOSIS — B952 Enterococcus as the cause of diseases classified elsewhere: Secondary | ICD-10-CM | POA: Diagnosis not present

## 2019-03-15 DIAGNOSIS — E1121 Type 2 diabetes mellitus with diabetic nephropathy: Secondary | ICD-10-CM | POA: Diagnosis not present

## 2019-03-15 DIAGNOSIS — E785 Hyperlipidemia, unspecified: Secondary | ICD-10-CM | POA: Diagnosis not present

## 2019-03-15 DIAGNOSIS — D631 Anemia in chronic kidney disease: Secondary | ICD-10-CM | POA: Diagnosis not present

## 2019-03-15 DIAGNOSIS — D509 Iron deficiency anemia, unspecified: Secondary | ICD-10-CM | POA: Diagnosis not present

## 2019-03-15 DIAGNOSIS — Z992 Dependence on renal dialysis: Secondary | ICD-10-CM | POA: Diagnosis not present

## 2019-03-15 LAB — BPAM RBC
Blood Product Expiration Date: 202009222359
Unit Type and Rh: 5100

## 2019-03-15 LAB — CULTURE, BLOOD (ROUTINE X 2)
Culture: NO GROWTH
Culture: NO GROWTH
Special Requests: ADEQUATE

## 2019-03-15 LAB — TYPE AND SCREEN
ABO/RH(D): O POS
Antibody Screen: NEGATIVE
Unit division: 0

## 2019-03-17 DIAGNOSIS — D509 Iron deficiency anemia, unspecified: Secondary | ICD-10-CM | POA: Diagnosis not present

## 2019-03-17 DIAGNOSIS — N186 End stage renal disease: Secondary | ICD-10-CM | POA: Diagnosis not present

## 2019-03-17 DIAGNOSIS — E1121 Type 2 diabetes mellitus with diabetic nephropathy: Secondary | ICD-10-CM | POA: Diagnosis not present

## 2019-03-17 DIAGNOSIS — R52 Pain, unspecified: Secondary | ICD-10-CM | POA: Diagnosis not present

## 2019-03-17 DIAGNOSIS — N2581 Secondary hyperparathyroidism of renal origin: Secondary | ICD-10-CM | POA: Diagnosis not present

## 2019-03-17 DIAGNOSIS — D689 Coagulation defect, unspecified: Secondary | ICD-10-CM | POA: Diagnosis not present

## 2019-03-17 DIAGNOSIS — N390 Urinary tract infection, site not specified: Secondary | ICD-10-CM | POA: Diagnosis not present

## 2019-03-17 DIAGNOSIS — B952 Enterococcus as the cause of diseases classified elsewhere: Secondary | ICD-10-CM | POA: Diagnosis not present

## 2019-03-17 DIAGNOSIS — D631 Anemia in chronic kidney disease: Secondary | ICD-10-CM | POA: Diagnosis not present

## 2019-03-17 DIAGNOSIS — Z992 Dependence on renal dialysis: Secondary | ICD-10-CM | POA: Diagnosis not present

## 2019-03-19 DIAGNOSIS — E785 Hyperlipidemia, unspecified: Secondary | ICD-10-CM | POA: Diagnosis not present

## 2019-03-19 DIAGNOSIS — I10 Essential (primary) hypertension: Secondary | ICD-10-CM | POA: Diagnosis not present

## 2019-03-19 DIAGNOSIS — K219 Gastro-esophageal reflux disease without esophagitis: Secondary | ICD-10-CM | POA: Diagnosis not present

## 2019-03-19 DIAGNOSIS — E119 Type 2 diabetes mellitus without complications: Secondary | ICD-10-CM | POA: Diagnosis not present

## 2019-03-20 DIAGNOSIS — E1121 Type 2 diabetes mellitus with diabetic nephropathy: Secondary | ICD-10-CM | POA: Diagnosis not present

## 2019-03-20 DIAGNOSIS — N186 End stage renal disease: Secondary | ICD-10-CM | POA: Diagnosis not present

## 2019-03-20 DIAGNOSIS — R52 Pain, unspecified: Secondary | ICD-10-CM | POA: Diagnosis not present

## 2019-03-20 DIAGNOSIS — B952 Enterococcus as the cause of diseases classified elsewhere: Secondary | ICD-10-CM | POA: Diagnosis not present

## 2019-03-20 DIAGNOSIS — D631 Anemia in chronic kidney disease: Secondary | ICD-10-CM | POA: Diagnosis not present

## 2019-03-20 DIAGNOSIS — N2581 Secondary hyperparathyroidism of renal origin: Secondary | ICD-10-CM | POA: Diagnosis not present

## 2019-03-20 DIAGNOSIS — E1122 Type 2 diabetes mellitus with diabetic chronic kidney disease: Secondary | ICD-10-CM | POA: Diagnosis not present

## 2019-03-20 DIAGNOSIS — D689 Coagulation defect, unspecified: Secondary | ICD-10-CM | POA: Diagnosis not present

## 2019-03-20 DIAGNOSIS — N390 Urinary tract infection, site not specified: Secondary | ICD-10-CM | POA: Diagnosis not present

## 2019-03-20 DIAGNOSIS — Z992 Dependence on renal dialysis: Secondary | ICD-10-CM | POA: Diagnosis not present

## 2019-03-20 DIAGNOSIS — D509 Iron deficiency anemia, unspecified: Secondary | ICD-10-CM | POA: Diagnosis not present

## 2019-03-21 ENCOUNTER — Other Ambulatory Visit: Payer: Self-pay

## 2019-03-21 NOTE — Patient Outreach (Signed)
[facility]) Care Management  03/21/2019  Michael Riggs Sep 10, 1970 [id number]   Medication Adherence call to [name] HIPPA Compliant Voice message left with a call back number. [name] is showing past due on Losartan/Hctz 100/12.5 mg and Atorvastatin 40 mg under [facility].   [place] Management Direct Dial [phone]  Fax [phone] [name]

## 2019-03-22 DIAGNOSIS — Z992 Dependence on renal dialysis: Secondary | ICD-10-CM | POA: Diagnosis not present

## 2019-03-22 DIAGNOSIS — D689 Coagulation defect, unspecified: Secondary | ICD-10-CM | POA: Diagnosis not present

## 2019-03-22 DIAGNOSIS — D509 Iron deficiency anemia, unspecified: Secondary | ICD-10-CM | POA: Diagnosis not present

## 2019-03-22 DIAGNOSIS — R52 Pain, unspecified: Secondary | ICD-10-CM | POA: Diagnosis not present

## 2019-03-22 DIAGNOSIS — N2581 Secondary hyperparathyroidism of renal origin: Secondary | ICD-10-CM | POA: Diagnosis not present

## 2019-03-22 DIAGNOSIS — N186 End stage renal disease: Secondary | ICD-10-CM | POA: Diagnosis not present

## 2019-03-23 ENCOUNTER — Other Ambulatory Visit: Payer: Self-pay | Admitting: *Deleted

## 2019-03-23 ENCOUNTER — Encounter: Payer: Self-pay | Admitting: *Deleted

## 2019-03-23 DIAGNOSIS — E785 Hyperlipidemia, unspecified: Secondary | ICD-10-CM | POA: Diagnosis not present

## 2019-03-23 DIAGNOSIS — K219 Gastro-esophageal reflux disease without esophagitis: Secondary | ICD-10-CM | POA: Diagnosis not present

## 2019-03-23 DIAGNOSIS — N186 End stage renal disease: Secondary | ICD-10-CM | POA: Diagnosis not present

## 2019-03-23 NOTE — Progress Notes (Signed)
Spoke with Michael Riggs at [facility] to schedule and confirm pre-op letter received for 04/20/2019 surgery for second stage BVT left arm. They will review instruction letter with patient.

## 2019-03-24 DIAGNOSIS — N186 End stage renal disease: Secondary | ICD-10-CM | POA: Diagnosis not present

## 2019-03-24 DIAGNOSIS — R52 Pain, unspecified: Secondary | ICD-10-CM | POA: Diagnosis not present

## 2019-03-24 DIAGNOSIS — Z992 Dependence on renal dialysis: Secondary | ICD-10-CM | POA: Diagnosis not present

## 2019-03-24 DIAGNOSIS — D689 Coagulation defect, unspecified: Secondary | ICD-10-CM | POA: Diagnosis not present

## 2019-03-24 DIAGNOSIS — D509 Iron deficiency anemia, unspecified: Secondary | ICD-10-CM | POA: Diagnosis not present

## 2019-03-24 DIAGNOSIS — N2581 Secondary hyperparathyroidism of renal origin: Secondary | ICD-10-CM | POA: Diagnosis not present

## 2019-03-28 ENCOUNTER — Encounter (HOSPITAL_COMMUNITY): Payer: Self-pay | Admitting: Internal Medicine

## 2019-03-28 ENCOUNTER — Emergency Department (HOSPITAL_COMMUNITY): Payer: Medicare Other

## 2019-03-28 ENCOUNTER — Inpatient Hospital Stay (HOSPITAL_COMMUNITY)
Admission: EM | Admit: 2019-03-28 | Discharge: 2019-04-27 | DRG: 871 | Disposition: A | Discharge status: Skilled Nursing Facility | Payer: Medicare Other | Source: Skilled Nursing Facility | Attending: Internal Medicine | Admitting: Internal Medicine

## 2019-03-28 ENCOUNTER — Other Ambulatory Visit: Payer: Self-pay

## 2019-03-28 DIAGNOSIS — M79604 Pain in right leg: Secondary | ICD-10-CM | POA: Diagnosis not present

## 2019-03-28 DIAGNOSIS — F05 Delirium due to known physiological condition: Secondary | ICD-10-CM | POA: Diagnosis not present

## 2019-03-28 DIAGNOSIS — Z6835 Body mass index (BMI) 35.0-35.9, adult: Secondary | ICD-10-CM

## 2019-03-28 DIAGNOSIS — J9811 Atelectasis: Secondary | ICD-10-CM | POA: Diagnosis not present

## 2019-03-28 DIAGNOSIS — F329 Major depressive disorder, single episode, unspecified: Secondary | ICD-10-CM | POA: Diagnosis present

## 2019-03-28 DIAGNOSIS — N179 Acute kidney failure, unspecified: Secondary | ICD-10-CM | POA: Diagnosis not present

## 2019-03-28 DIAGNOSIS — R4182 Altered mental status, unspecified: Secondary | ICD-10-CM | POA: Diagnosis not present

## 2019-03-28 DIAGNOSIS — A4181 Sepsis due to Enterococcus: Secondary | ICD-10-CM | POA: Diagnosis not present

## 2019-03-28 DIAGNOSIS — K5900 Constipation, unspecified: Secondary | ICD-10-CM | POA: Diagnosis not present

## 2019-03-28 DIAGNOSIS — E113599 Type 2 diabetes mellitus with proliferative diabetic retinopathy without macular edema, unspecified eye: Secondary | ICD-10-CM | POA: Diagnosis present

## 2019-03-28 DIAGNOSIS — Z23 Encounter for immunization: Secondary | ICD-10-CM

## 2019-03-28 DIAGNOSIS — E43 Unspecified severe protein-calorie malnutrition: Secondary | ICD-10-CM | POA: Diagnosis not present

## 2019-03-28 DIAGNOSIS — E662 Morbid (severe) obesity with alveolar hypoventilation: Secondary | ICD-10-CM | POA: Diagnosis present

## 2019-03-28 DIAGNOSIS — G4730 Sleep apnea, unspecified: Secondary | ICD-10-CM | POA: Diagnosis not present

## 2019-03-28 DIAGNOSIS — E785 Hyperlipidemia, unspecified: Secondary | ICD-10-CM | POA: Diagnosis present

## 2019-03-28 DIAGNOSIS — J90 Pleural effusion, not elsewhere classified: Secondary | ICD-10-CM | POA: Diagnosis not present

## 2019-03-28 DIAGNOSIS — R197 Diarrhea, unspecified: Secondary | ICD-10-CM | POA: Diagnosis not present

## 2019-03-28 DIAGNOSIS — N2581 Secondary hyperparathyroidism of renal origin: Secondary | ICD-10-CM | POA: Diagnosis not present

## 2019-03-28 DIAGNOSIS — N390 Urinary tract infection, site not specified: Secondary | ICD-10-CM | POA: Diagnosis not present

## 2019-03-28 DIAGNOSIS — Z4682 Encounter for fitting and adjustment of non-vascular catheter: Secondary | ICD-10-CM | POA: Diagnosis not present

## 2019-03-28 DIAGNOSIS — E1165 Type 2 diabetes mellitus with hyperglycemia: Secondary | ICD-10-CM | POA: Diagnosis not present

## 2019-03-28 DIAGNOSIS — L988 Other specified disorders of the skin and subcutaneous tissue: Secondary | ICD-10-CM | POA: Diagnosis not present

## 2019-03-28 DIAGNOSIS — Z79891 Long term (current) use of opiate analgesic: Secondary | ICD-10-CM

## 2019-03-28 DIAGNOSIS — Z89512 Acquired absence of left leg below knee: Secondary | ICD-10-CM

## 2019-03-28 DIAGNOSIS — Z20828 Contact with and (suspected) exposure to other viral communicable diseases: Secondary | ICD-10-CM | POA: Diagnosis present

## 2019-03-28 DIAGNOSIS — I10 Essential (primary) hypertension: Secondary | ICD-10-CM | POA: Diagnosis present

## 2019-03-28 DIAGNOSIS — R109 Unspecified abdominal pain: Secondary | ICD-10-CM

## 2019-03-28 DIAGNOSIS — M2550 Pain in unspecified joint: Secondary | ICD-10-CM | POA: Diagnosis not present

## 2019-03-28 DIAGNOSIS — J9690 Respiratory failure, unspecified, unspecified whether with hypoxia or hypercapnia: Secondary | ICD-10-CM

## 2019-03-28 DIAGNOSIS — E1122 Type 2 diabetes mellitus with diabetic chronic kidney disease: Secondary | ICD-10-CM | POA: Diagnosis not present

## 2019-03-28 DIAGNOSIS — D631 Anemia in chronic kidney disease: Secondary | ICD-10-CM | POA: Diagnosis not present

## 2019-03-28 DIAGNOSIS — R61 Generalized hyperhidrosis: Secondary | ICD-10-CM | POA: Diagnosis not present

## 2019-03-28 DIAGNOSIS — Z87891 Personal history of nicotine dependence: Secondary | ICD-10-CM

## 2019-03-28 DIAGNOSIS — R339 Retention of urine, unspecified: Secondary | ICD-10-CM | POA: Diagnosis present

## 2019-03-28 DIAGNOSIS — J181 Lobar pneumonia, unspecified organism: Secondary | ICD-10-CM | POA: Diagnosis not present

## 2019-03-28 DIAGNOSIS — K219 Gastro-esophageal reflux disease without esophagitis: Secondary | ICD-10-CM | POA: Diagnosis not present

## 2019-03-28 DIAGNOSIS — R609 Edema, unspecified: Secondary | ICD-10-CM | POA: Diagnosis not present

## 2019-03-28 DIAGNOSIS — L8990 Pressure ulcer of unspecified site, unspecified stage: Secondary | ICD-10-CM | POA: Insufficient documentation

## 2019-03-28 DIAGNOSIS — Z992 Dependence on renal dialysis: Secondary | ICD-10-CM | POA: Diagnosis not present

## 2019-03-28 DIAGNOSIS — D638 Anemia in other chronic diseases classified elsewhere: Secondary | ICD-10-CM | POA: Diagnosis not present

## 2019-03-28 DIAGNOSIS — L03115 Cellulitis of right lower limb: Secondary | ICD-10-CM | POA: Diagnosis not present

## 2019-03-28 DIAGNOSIS — M79661 Pain in right lower leg: Secondary | ICD-10-CM | POA: Diagnosis not present

## 2019-03-28 DIAGNOSIS — Z515 Encounter for palliative care: Secondary | ICD-10-CM | POA: Diagnosis not present

## 2019-03-28 DIAGNOSIS — E1151 Type 2 diabetes mellitus with diabetic peripheral angiopathy without gangrene: Secondary | ICD-10-CM | POA: Diagnosis not present

## 2019-03-28 DIAGNOSIS — I4581 Long QT syndrome: Secondary | ICD-10-CM | POA: Diagnosis present

## 2019-03-28 DIAGNOSIS — Z01818 Encounter for other preprocedural examination: Secondary | ICD-10-CM

## 2019-03-28 DIAGNOSIS — R52 Pain, unspecified: Secondary | ICD-10-CM | POA: Diagnosis not present

## 2019-03-28 DIAGNOSIS — G253 Myoclonus: Secondary | ICD-10-CM | POA: Diagnosis present

## 2019-03-28 DIAGNOSIS — Z79899 Other long term (current) drug therapy: Secondary | ICD-10-CM

## 2019-03-28 DIAGNOSIS — R5381 Other malaise: Secondary | ICD-10-CM | POA: Diagnosis not present

## 2019-03-28 DIAGNOSIS — N186 End stage renal disease: Secondary | ICD-10-CM

## 2019-03-28 DIAGNOSIS — R6521 Severe sepsis with septic shock: Secondary | ICD-10-CM | POA: Diagnosis not present

## 2019-03-28 DIAGNOSIS — Z8619 Personal history of other infectious and parasitic diseases: Secondary | ICD-10-CM

## 2019-03-28 DIAGNOSIS — Z79890 Hormone replacement therapy (postmenopausal): Secondary | ICD-10-CM

## 2019-03-28 DIAGNOSIS — J9601 Acute respiratory failure with hypoxia: Secondary | ICD-10-CM | POA: Diagnosis not present

## 2019-03-28 DIAGNOSIS — Z7401 Bed confinement status: Secondary | ICD-10-CM | POA: Diagnosis not present

## 2019-03-28 DIAGNOSIS — I469 Cardiac arrest, cause unspecified: Secondary | ICD-10-CM | POA: Diagnosis not present

## 2019-03-28 DIAGNOSIS — R262 Difficulty in walking, not elsewhere classified: Secondary | ICD-10-CM | POA: Diagnosis not present

## 2019-03-28 DIAGNOSIS — R6511 Systemic inflammatory response syndrome (SIRS) of non-infectious origin with acute organ dysfunction: Secondary | ICD-10-CM | POA: Diagnosis not present

## 2019-03-28 DIAGNOSIS — I517 Cardiomegaly: Secondary | ICD-10-CM | POA: Diagnosis not present

## 2019-03-28 DIAGNOSIS — E1142 Type 2 diabetes mellitus with diabetic polyneuropathy: Secondary | ICD-10-CM | POA: Diagnosis present

## 2019-03-28 DIAGNOSIS — E875 Hyperkalemia: Secondary | ICD-10-CM | POA: Diagnosis not present

## 2019-03-28 DIAGNOSIS — I872 Venous insufficiency (chronic) (peripheral): Secondary | ICD-10-CM | POA: Diagnosis present

## 2019-03-28 DIAGNOSIS — K5901 Slow transit constipation: Secondary | ICD-10-CM | POA: Diagnosis present

## 2019-03-28 DIAGNOSIS — R0902 Hypoxemia: Secondary | ICD-10-CM | POA: Diagnosis not present

## 2019-03-28 DIAGNOSIS — E876 Hypokalemia: Secondary | ICD-10-CM | POA: Diagnosis not present

## 2019-03-28 DIAGNOSIS — G92 Toxic encephalopathy: Secondary | ICD-10-CM | POA: Diagnosis not present

## 2019-03-28 DIAGNOSIS — Z7189 Other specified counseling: Secondary | ICD-10-CM

## 2019-03-28 DIAGNOSIS — L089 Local infection of the skin and subcutaneous tissue, unspecified: Secondary | ICD-10-CM | POA: Diagnosis not present

## 2019-03-28 DIAGNOSIS — F32A Depression, unspecified: Secondary | ICD-10-CM | POA: Diagnosis present

## 2019-03-28 DIAGNOSIS — Z452 Encounter for adjustment and management of vascular access device: Secondary | ICD-10-CM

## 2019-03-28 DIAGNOSIS — L299 Pruritus, unspecified: Secondary | ICD-10-CM | POA: Diagnosis not present

## 2019-03-28 DIAGNOSIS — R05 Cough: Secondary | ICD-10-CM | POA: Diagnosis not present

## 2019-03-28 DIAGNOSIS — R509 Fever, unspecified: Secondary | ICD-10-CM | POA: Diagnosis not present

## 2019-03-28 DIAGNOSIS — A419 Sepsis, unspecified organism: Secondary | ICD-10-CM | POA: Diagnosis not present

## 2019-03-28 DIAGNOSIS — R531 Weakness: Secondary | ICD-10-CM | POA: Diagnosis not present

## 2019-03-28 DIAGNOSIS — I82401 Acute embolism and thrombosis of unspecified deep veins of right lower extremity: Secondary | ICD-10-CM | POA: Diagnosis not present

## 2019-03-28 DIAGNOSIS — T148XXA Other injury of unspecified body region, initial encounter: Secondary | ICD-10-CM | POA: Diagnosis not present

## 2019-03-28 DIAGNOSIS — L298 Other pruritus: Secondary | ICD-10-CM | POA: Diagnosis present

## 2019-03-28 DIAGNOSIS — I120 Hypertensive chronic kidney disease with stage 5 chronic kidney disease or end stage renal disease: Secondary | ICD-10-CM | POA: Diagnosis not present

## 2019-03-28 DIAGNOSIS — R0602 Shortness of breath: Secondary | ICD-10-CM | POA: Diagnosis not present

## 2019-03-28 DIAGNOSIS — R6510 Systemic inflammatory response syndrome (SIRS) of non-infectious origin without acute organ dysfunction: Secondary | ICD-10-CM

## 2019-03-28 DIAGNOSIS — Z87440 Personal history of urinary (tract) infections: Secondary | ICD-10-CM

## 2019-03-28 DIAGNOSIS — J811 Chronic pulmonary edema: Secondary | ICD-10-CM | POA: Diagnosis not present

## 2019-03-28 DIAGNOSIS — Z0189 Encounter for other specified special examinations: Secondary | ICD-10-CM

## 2019-03-28 HISTORY — DX: Local infection of the skin and subcutaneous tissue, unspecified: L08.9

## 2019-03-28 HISTORY — DX: Hyperlipidemia, unspecified: E78.5

## 2019-03-28 HISTORY — DX: Type 2 diabetes mellitus with diabetic peripheral angiopathy without gangrene: E11.51

## 2019-03-28 HISTORY — DX: Other malaise: R53.81

## 2019-03-28 LAB — CBC WITH DIFFERENTIAL/PLATELET
Abs Immature Granulocytes: 0.09 10*3/uL — ABNORMAL HIGH (ref 0.00–0.07)
Basophils Absolute: 0.1 10*3/uL (ref 0.0–0.1)
Basophils Relative: 1 %
Eosinophils Absolute: 0.8 10*3/uL — ABNORMAL HIGH (ref 0.0–0.5)
Eosinophils Relative: 5 %
HCT: 23.8 % — ABNORMAL LOW (ref 39.0–52.0)
Hemoglobin: 7.1 g/dL — ABNORMAL LOW (ref 13.0–17.0)
Immature Granulocytes: 1 %
Lymphocytes Relative: 16 %
Lymphs Abs: 2.3 10*3/uL (ref 0.7–4.0)
MCH: 27.3 pg (ref 26.0–34.0)
MCHC: 29.8 g/dL — ABNORMAL LOW (ref 30.0–36.0)
MCV: 91.5 fL (ref 80.0–100.0)
Monocytes Absolute: 1.3 10*3/uL — ABNORMAL HIGH (ref 0.1–1.0)
Monocytes Relative: 9 %
Neutro Abs: 10.1 10*3/uL — ABNORMAL HIGH (ref 1.7–7.7)
Neutrophils Relative %: 68 %
Platelets: 360 10*3/uL (ref 150–400)
RBC: 2.6 MIL/uL — ABNORMAL LOW (ref 4.22–5.81)
RDW: 15.3 % (ref 11.5–15.5)
WBC: 14.6 10*3/uL — ABNORMAL HIGH (ref 4.0–10.5)
nRBC: 0 % (ref 0.0–0.2)

## 2019-03-28 LAB — LACTIC ACID, PLASMA
Lactic Acid, Venous: 1.4 mmol/L (ref 0.5–1.9)
Lactic Acid, Venous: 2.2 mmol/L (ref 0.5–1.9)
Lactic Acid, Venous: 0.9 mmol/L (ref 0.5–1.9)

## 2019-03-28 LAB — COMPREHENSIVE METABOLIC PANEL
ALT: 23 U/L (ref 0–44)
AST: 15 U/L (ref 15–41)
Albumin: 2.3 g/dL — ABNORMAL LOW (ref 3.5–5.0)
Alkaline Phosphatase: 125 U/L (ref 38–126)
Anion gap: 19 — ABNORMAL HIGH (ref 5–15)
BUN: 74 mg/dL — ABNORMAL HIGH (ref 6–20)
CO2: 21 mmol/L — ABNORMAL LOW (ref 22–32)
Calcium: 8.5 mg/dL — ABNORMAL LOW (ref 8.9–10.3)
Chloride: 95 mmol/L — ABNORMAL LOW (ref 98–111)
Creatinine, Ser: 9.93 mg/dL — ABNORMAL HIGH (ref 0.61–1.24)
GFR calc Af Amer: 6 mL/min — ABNORMAL LOW (ref >60)
GFR calc non Af Amer: 6 mL/min — ABNORMAL LOW (ref >60)
Glucose, Bld: 78 mg/dL (ref 70–99)
Potassium: 4.1 mmol/L (ref 3.5–5.1)
Sodium: 135 mmol/L (ref 135–145)
Total Bilirubin: 1 mg/dL (ref 0.3–1.2)
Total Protein: 9.2 g/dL — ABNORMAL HIGH (ref 6.5–8.1)

## 2019-03-28 LAB — VANCOMYCIN, RANDOM
Vancomycin Rm: 7
Vancomycin Rm: 5

## 2019-03-28 LAB — PREALBUMIN: Prealbumin: 9.1 mg/dL — ABNORMAL LOW (ref 18–38)

## 2019-03-28 LAB — PROTIME-INR
INR: 1.3 — ABNORMAL HIGH (ref 0.8–1.2)
Prothrombin Time: 16.2 seconds — ABNORMAL HIGH (ref 11.4–15.2)

## 2019-03-28 LAB — C-REACTIVE PROTEIN: CRP: 19.4 mg/dL — ABNORMAL HIGH (ref <1.0)

## 2019-03-28 LAB — SARS CORONAVIRUS 2 BY RT PCR (HOSPITAL ORDER, PERFORMED IN ~~LOC~~ HOSPITAL LAB): SARS Coronavirus 2: NEGATIVE

## 2019-03-28 LAB — SEDIMENTATION RATE: Sed Rate: >140 mm/hr — ABNORMAL HIGH (ref 0–16)

## 2019-03-28 LAB — GLUCOSE, CAPILLARY: Glucose-Capillary: 88 mg/dL (ref 70–99)

## 2019-03-28 LAB — APTT: aPTT: 33 seconds (ref 24–36)

## 2019-03-28 IMAGING — DX DG TIBIA/FIBULA 2V*R*
2 series · 2 of 2 positions shown · non-contrast
Comparison: None.

CLINICAL DATA: Right leg pain and burning sensation

EXAM:
RIGHT TIBIA AND FIBULA - 2 VIEW

[tibia ap]
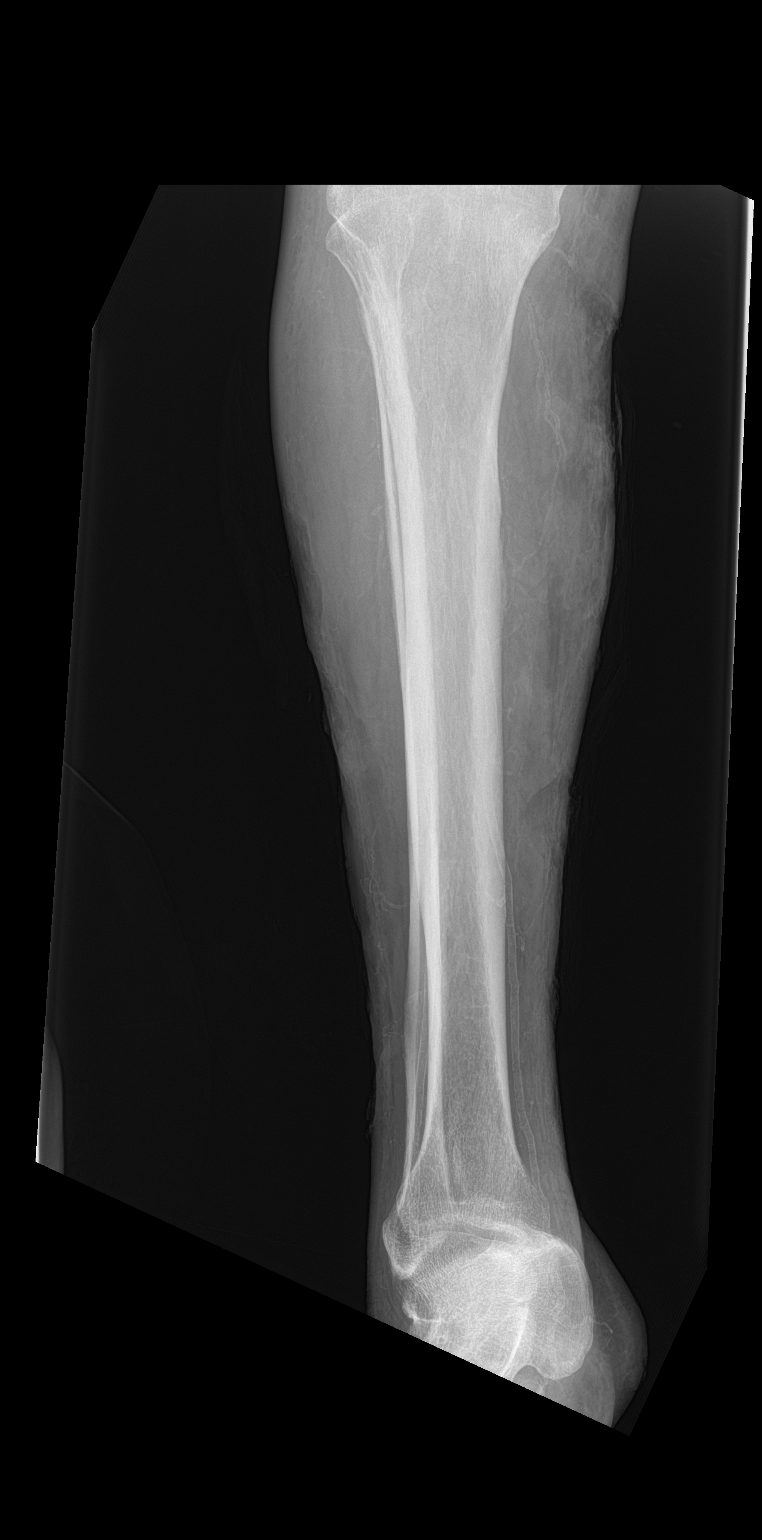

[tibia lat]
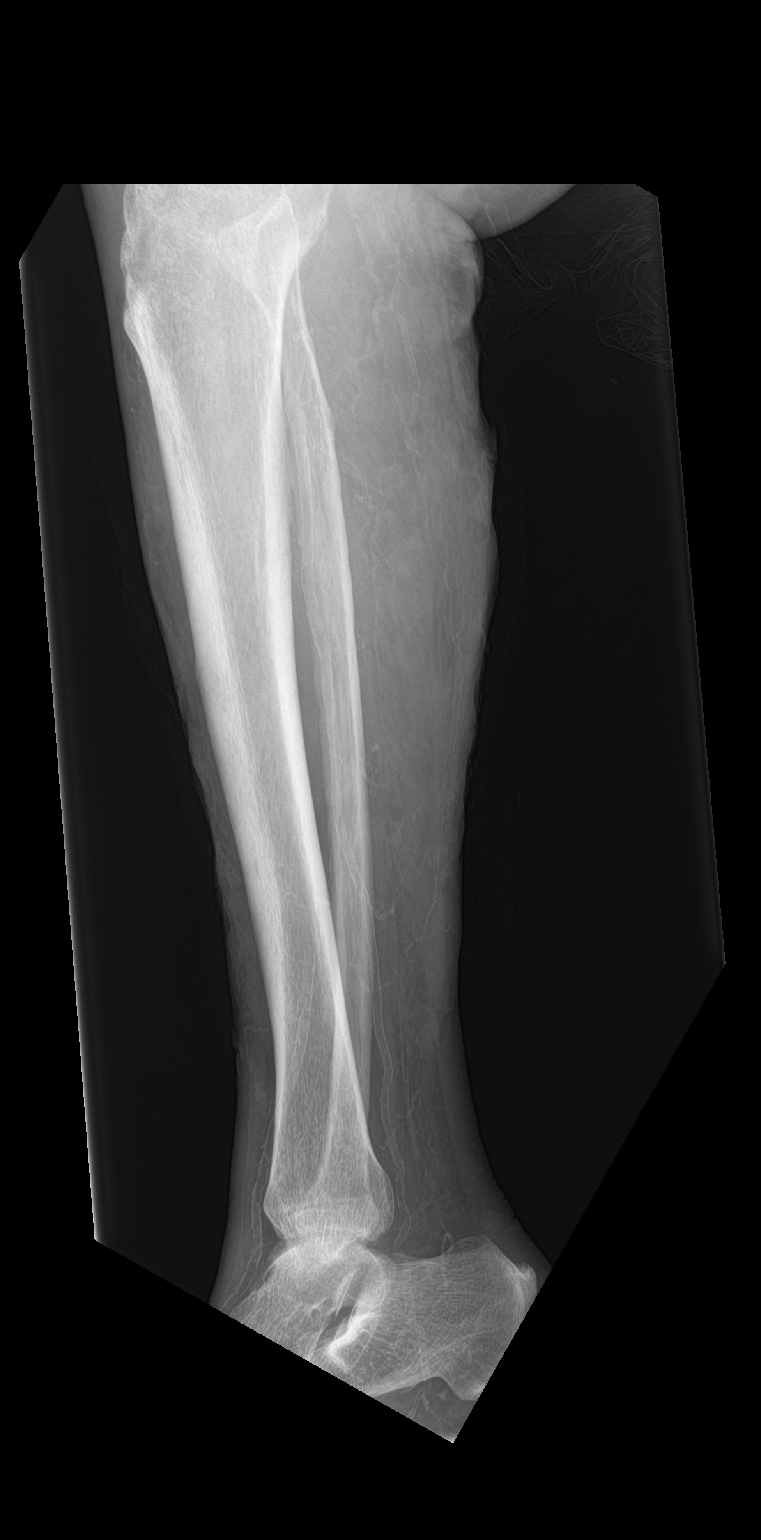

[2 of 2 positions shown; findings below may reference images not displayed]

FINDINGS: There is no evidence of fracture or other focal bone lesions. Soft
tissues are unremarkable. No soft tissue emphysema. Peripheral
vascular atherosclerotic disease.
IMPRESSION: No acute osseous abnormality of the right tibia and fibula.

## 2019-03-28 IMAGING — DX DG CHEST 1V PORT
2 series · 2 of 2 positions shown · non-contrast
Comparison: [DATE]

CLINICAL DATA: Acute dyspnea, malaise, cough, chest discomfort

EXAM:
PORTABLE CHEST 1 VIEW

[chest ap (1 of 2)]
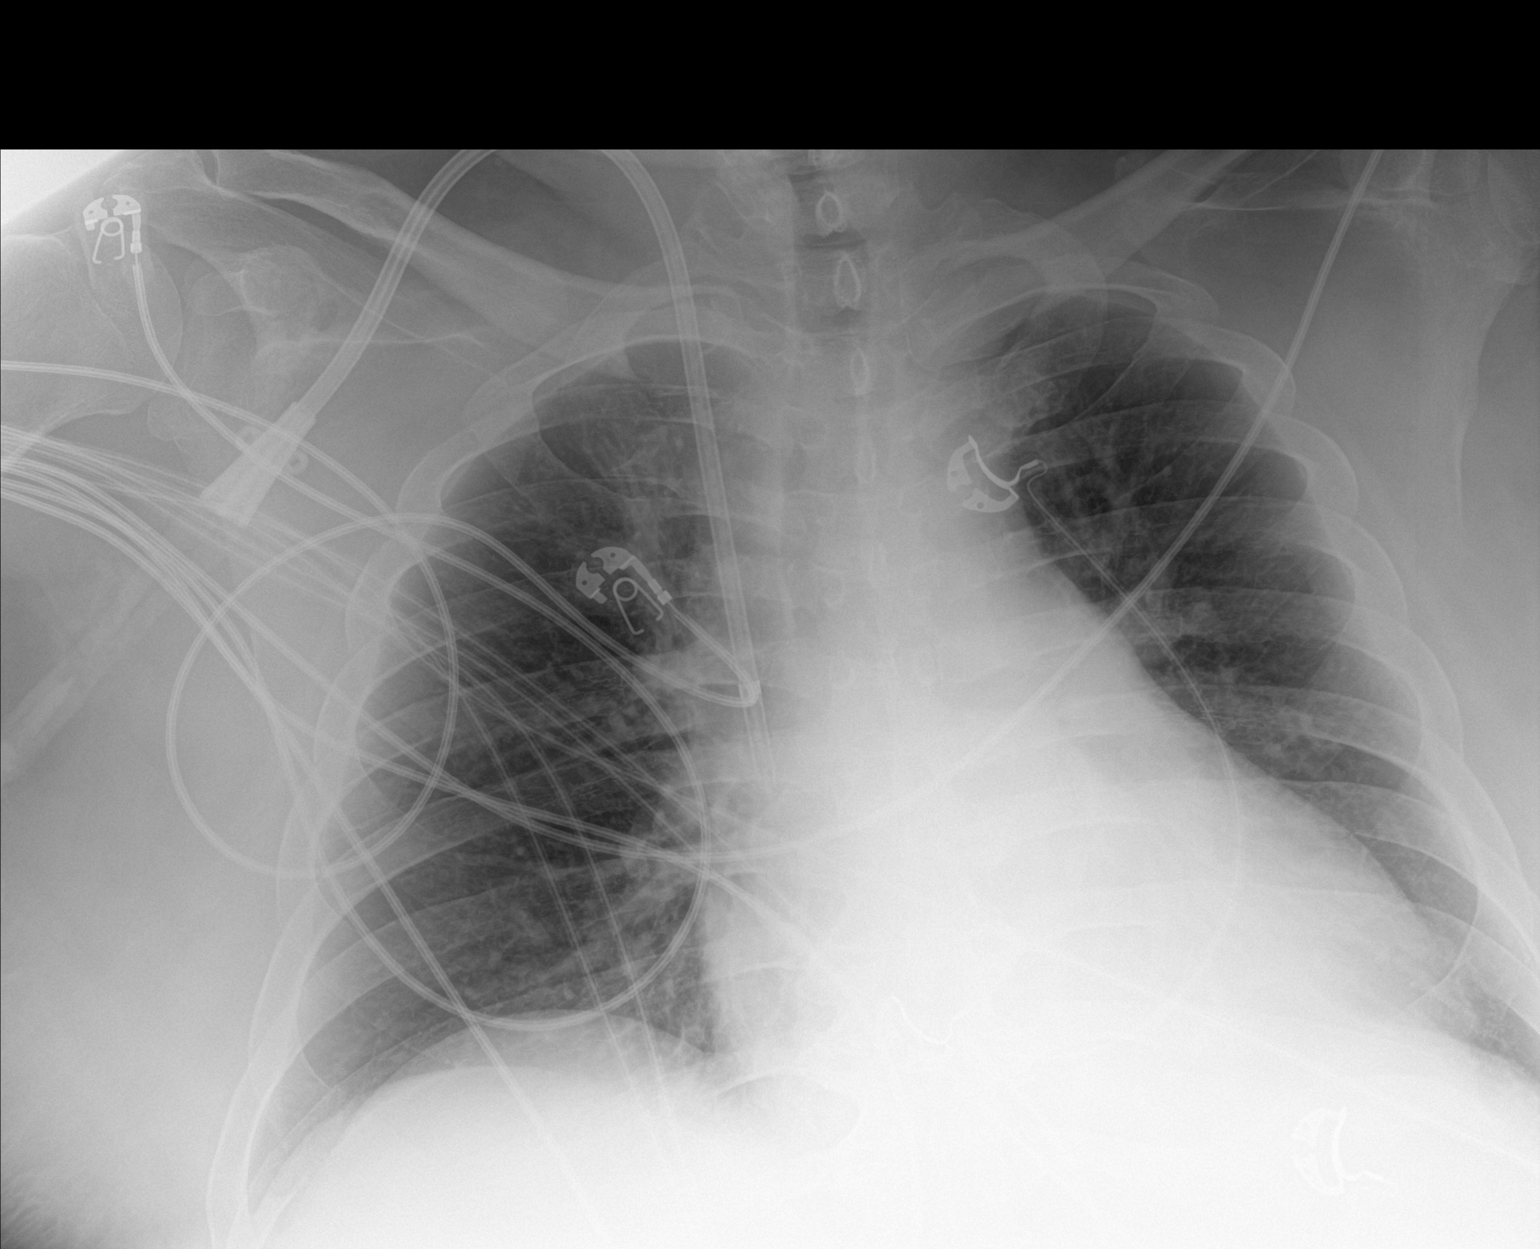

[chest ap (2 of 2)]
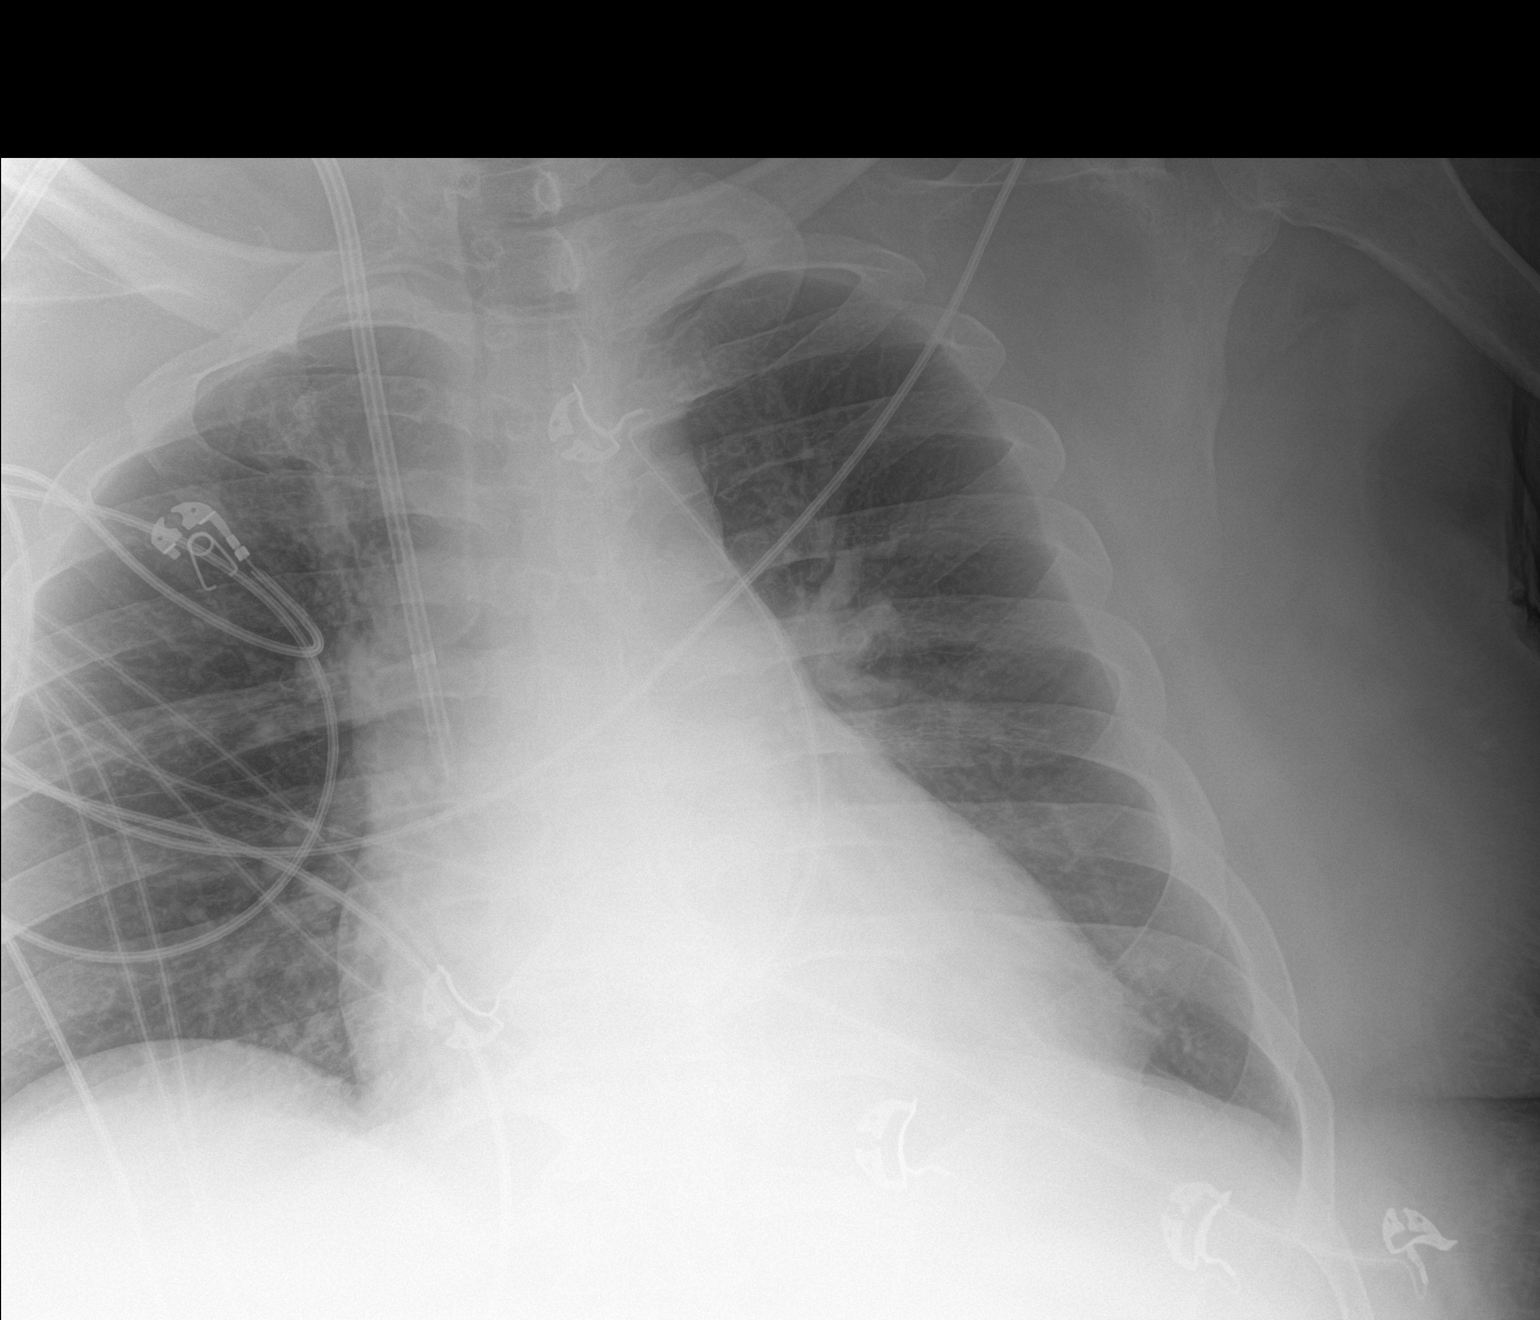

[2 of 2 positions shown; findings below may reference images not displayed]

FINDINGS: There is a dual lumen right-sided central venous catheter with the
tip projecting over the SVC. There is no focal consolidation. There
is no pleural effusion or pneumothorax. The heart and mediastinal
contours are stable.

There is no acute osseous abnormality.
IMPRESSION: No active disease.

## 2019-03-28 MED ORDER — TRAMADOL HCL 50 MG PO TABS: 50.0000 mg | ORAL_TABLET | Freq: Four times a day (QID) | ORAL | Status: DC | PRN

## 2019-03-28 MED ORDER — DOCUSATE SODIUM 283 MG RE ENEM
1.0000 | ENEMA | RECTAL | Status: DC | PRN
  Filled 2019-03-28: qty 1

## 2019-03-28 MED ORDER — ATORVASTATIN CALCIUM 40 MG PO TABS
40.0000 mg | ORAL_TABLET | Freq: Every day | ORAL | Status: DC
  Administered 2019-03-28 – 2019-04-26 (×29): 40 mg via ORAL
  Filled 2019-03-28 (×30): qty 1

## 2019-03-28 MED ORDER — SODIUM CHLORIDE 0.9 % IV SOLN
1.0000 g | INTRAVENOUS | Status: DC
  Filled 2019-03-28: qty 1

## 2019-03-28 MED ORDER — NEPRO/CARBSTEADY PO LIQD: 237.0000 mL | Freq: Three times a day (TID) | ORAL | Status: DC | PRN

## 2019-03-28 MED ORDER — DARBEPOETIN ALFA 150 MCG/0.3ML IJ SOSY
150.0000 ug | PREFILLED_SYRINGE | INTRAMUSCULAR | Status: DC
  Administered 2019-03-29 – 2019-04-26 (×4): 150 ug via INTRAVENOUS
  Filled 2019-03-28 (×4): qty 0.3

## 2019-03-28 MED ORDER — MELATONIN 3 MG PO TABS
4.5000 mg | ORAL_TABLET | Freq: Every day | ORAL | Status: DC
  Administered 2019-03-28 – 2019-04-10 (×13): 4.5 mg via ORAL
  Filled 2019-03-28 (×17): qty 1.5

## 2019-03-28 MED ORDER — BISACODYL 10 MG RE SUPP: 10.0000 mg | Freq: Every day | RECTAL | Status: DC | PRN

## 2019-03-28 MED ORDER — POLYETHYLENE GLYCOL 3350 17 G PO PACK
17.0000 g | PACK | Freq: Every day | ORAL | Status: DC
  Filled 2019-03-28: qty 1

## 2019-03-28 MED ORDER — HEPARIN SODIUM (PORCINE) 1000 UNIT/ML IJ SOLN
3.8000 mL | Freq: Once | INTRAMUSCULAR | Status: AC
  Administered 2019-03-28: 3800 [IU] via INTRAVENOUS

## 2019-03-28 MED ORDER — ONDANSETRON HCL 4 MG/2ML IJ SOLN: 4.0000 mg | Freq: Four times a day (QID) | INTRAMUSCULAR | Status: DC | PRN

## 2019-03-28 MED ORDER — SODIUM CHLORIDE 0.9 % IV SOLN
2.0000 g | INTRAVENOUS | Status: DC
  Administered 2019-03-29 – 2019-04-07 (×5): 2 g via INTRAVENOUS
  Filled 2019-03-28 (×5): qty 2

## 2019-03-28 MED ORDER — SODIUM CHLORIDE 0.9 % IV SOLN
2.0000 g | INTRAVENOUS | Status: DC
  Filled 2019-03-28: qty 2

## 2019-03-28 MED ORDER — METHOCARBAMOL 500 MG PO TABS
500.0000 mg | ORAL_TABLET | Freq: Three times a day (TID) | ORAL | Status: DC | PRN
  Administered 2019-03-31 – 2019-04-07 (×3): 500 mg via ORAL
  Filled 2019-03-28 (×3): qty 1

## 2019-03-28 MED ORDER — TAMSULOSIN HCL 0.4 MG PO CAPS
0.4000 mg | ORAL_CAPSULE | Freq: Every day | ORAL | Status: DC
  Administered 2019-03-29 – 2019-04-13 (×15): 0.4 mg via ORAL
  Filled 2019-03-28 (×14): qty 1

## 2019-03-28 MED ORDER — RENA-VITE PO TABS
1.0000 | ORAL_TABLET | Freq: Every day | ORAL | Status: DC
  Administered 2019-03-28 – 2019-04-09 (×13): 1 via ORAL
  Filled 2019-03-28 (×13): qty 1

## 2019-03-28 MED ORDER — CALCIUM CARBONATE ANTACID 1250 MG/5ML PO SUSP
500.0000 mg | Freq: Four times a day (QID) | ORAL | Status: DC | PRN
  Filled 2019-03-28: qty 5

## 2019-03-28 MED ORDER — SODIUM CHLORIDE 0.9 % IV SOLN
2.0000 g | Freq: Once | INTRAVENOUS | Status: AC
  Administered 2019-03-28: 2 g via INTRAVENOUS
  Filled 2019-03-28: qty 20

## 2019-03-28 MED ORDER — CHLORHEXIDINE GLUCONATE CLOTH 2 % EX PADS: 6.0000 | MEDICATED_PAD | Freq: Every day | CUTANEOUS | Status: DC

## 2019-03-28 MED ORDER — SORBITOL 70 % SOLN: 30.0000 mL | Status: DC | PRN

## 2019-03-28 MED ORDER — PANTOPRAZOLE SODIUM 40 MG PO TBEC
40.0000 mg | DELAYED_RELEASE_TABLET | Freq: Every day | ORAL | Status: DC
  Administered 2019-03-28 – 2019-04-27 (×31): 40 mg via ORAL
  Filled 2019-03-28 (×31): qty 1

## 2019-03-28 MED ORDER — METRONIDAZOLE IN NACL 5-0.79 MG/ML-% IV SOLN
500.0000 mg | Freq: Three times a day (TID) | INTRAVENOUS | Status: DC
  Administered 2019-03-28 – 2019-03-31 (×10): 500 mg via INTRAVENOUS
  Filled 2019-03-28 (×10): qty 100

## 2019-03-28 MED ORDER — ACETAMINOPHEN 325 MG PO TABS
650.0000 mg | ORAL_TABLET | Freq: Four times a day (QID) | ORAL | Status: DC | PRN
  Administered 2019-03-28 – 2019-04-24 (×17): 650 mg via ORAL
  Filled 2019-03-28 (×20): qty 2

## 2019-03-28 MED ORDER — CALCITRIOL 0.25 MCG PO CAPS
0.2500 ug | ORAL_CAPSULE | ORAL | Status: DC
  Administered 2019-03-29 (×2): 0.25 ug via ORAL
  Filled 2019-03-28: qty 1

## 2019-03-28 MED ORDER — CITALOPRAM HYDROBROMIDE 20 MG PO TABS
40.0000 mg | ORAL_TABLET | Freq: Every day | ORAL | Status: DC
  Administered 2019-03-28 – 2019-04-15 (×19): 40 mg via ORAL
  Filled 2019-03-28: qty 1
  Filled 2019-03-28: qty 4
  Filled 2019-03-28: qty 1
  Filled 2019-03-28: qty 2
  Filled 2019-03-28 (×3): qty 1
  Filled 2019-03-28: qty 4
  Filled 2019-03-28: qty 1
  Filled 2019-03-28: qty 2
  Filled 2019-03-28 (×2): qty 1
  Filled 2019-03-28: qty 4
  Filled 2019-03-28 (×6): qty 1

## 2019-03-28 MED ORDER — OXYCODONE HCL 5 MG PO TABS
5.0000 mg | ORAL_TABLET | Freq: Three times a day (TID) | ORAL | Status: DC
  Administered 2019-03-29 – 2019-03-31 (×6): 5 mg via ORAL
  Filled 2019-03-28 (×7): qty 1

## 2019-03-28 MED ORDER — ACETAMINOPHEN 650 MG RE SUPP: 650.0000 mg | Freq: Four times a day (QID) | RECTAL | Status: DC | PRN

## 2019-03-28 MED ORDER — HEPARIN SODIUM (PORCINE) 1000 UNIT/ML IJ SOLN
INTRAMUSCULAR | Status: AC
  Filled 2019-03-28: qty 4

## 2019-03-28 MED ORDER — HYDROXYZINE HCL 25 MG PO TABS
25.0000 mg | ORAL_TABLET | Freq: Three times a day (TID) | ORAL | Status: DC | PRN
  Administered 2019-03-29 – 2019-03-31 (×2): 25 mg via ORAL
  Filled 2019-03-28 (×3): qty 1

## 2019-03-28 MED ORDER — CHLORHEXIDINE GLUCONATE CLOTH 2 % EX PADS
6.0000 | MEDICATED_PAD | Freq: Every day | CUTANEOUS | Status: DC
  Administered 2019-03-29 – 2019-04-01 (×4): 6 via TOPICAL

## 2019-03-28 MED ORDER — ZOLPIDEM TARTRATE 5 MG PO TABS
5.0000 mg | ORAL_TABLET | Freq: Every evening | ORAL | Status: DC | PRN
  Administered 2019-04-02: 5 mg via ORAL
  Filled 2019-03-28: qty 1

## 2019-03-28 MED ORDER — BETHANECHOL CHLORIDE 25 MG PO TABS
25.0000 mg | ORAL_TABLET | Freq: Three times a day (TID) | ORAL | Status: DC
  Administered 2019-03-28 – 2019-04-10 (×39): 25 mg via ORAL
  Filled 2019-03-28 (×43): qty 1

## 2019-03-28 MED ORDER — ALBUTEROL SULFATE HFA 108 (90 BASE) MCG/ACT IN AERS: 2.0000 | INHALATION_SPRAY | Freq: Four times a day (QID) | RESPIRATORY_TRACT | Status: DC | PRN

## 2019-03-28 MED ORDER — LORAZEPAM 2 MG/ML IJ SOLN
INTRAMUSCULAR | Status: AC
  Filled 2019-03-28: qty 1

## 2019-03-28 MED ORDER — VANCOMYCIN HCL 10 G IV SOLR
2000.0000 mg | INTRAVENOUS | Status: AC
  Administered 2019-03-28: 2000 mg via INTRAVENOUS
  Filled 2019-03-28: qty 2000

## 2019-03-28 MED ORDER — GABAPENTIN 300 MG PO CAPS
300.0000 mg | ORAL_CAPSULE | Freq: Every day | ORAL | Status: DC
  Administered 2019-03-28 – 2019-04-12 (×15): 300 mg via ORAL
  Filled 2019-03-28 (×16): qty 1

## 2019-03-28 MED ORDER — SENNOSIDES-DOCUSATE SODIUM 8.6-50 MG PO TABS
1.0000 | ORAL_TABLET | Freq: Two times a day (BID) | ORAL | Status: DC
  Administered 2019-03-28 – 2019-03-29 (×2): 1 via ORAL
  Filled 2019-03-28 (×5): qty 1

## 2019-03-28 MED ORDER — LORAZEPAM 2 MG/ML IJ SOLN
0.5000 mg | Freq: Once | INTRAMUSCULAR | Status: AC
  Administered 2019-03-28: 0.5 mg via INTRAVENOUS

## 2019-03-28 MED ORDER — TRAMADOL HCL 50 MG PO TABS
ORAL_TABLET | ORAL | Status: AC
  Filled 2019-03-28: qty 1

## 2019-03-28 MED ORDER — HEPARIN SODIUM (PORCINE) 5000 UNIT/ML IJ SOLN
5000.0000 [IU] | Freq: Three times a day (TID) | INTRAMUSCULAR | Status: DC
  Administered 2019-03-28 – 2019-04-09 (×31): 5000 [IU] via SUBCUTANEOUS
  Filled 2019-03-28 (×31): qty 1

## 2019-03-28 MED ORDER — ALBUTEROL SULFATE (2.5 MG/3ML) 0.083% IN NEBU
2.5000 mg | INHALATION_SOLUTION | Freq: Four times a day (QID) | RESPIRATORY_TRACT | Status: DC | PRN
  Filled 2019-03-28: qty 3

## 2019-03-28 MED ORDER — ONDANSETRON HCL 4 MG PO TABS: 4.0000 mg | ORAL_TABLET | Freq: Four times a day (QID) | ORAL | Status: DC | PRN

## 2019-03-28 MED ORDER — VANCOMYCIN HCL IN DEXTROSE 1-5 GM/200ML-% IV SOLN
1000.0000 mg | INTRAVENOUS | Status: DC
  Administered 2019-03-29 – 2019-03-31 (×2): 1000 mg via INTRAVENOUS
  Filled 2019-03-28 (×3): qty 200

## 2019-03-28 MED ORDER — CAMPHOR-MENTHOL 0.5-0.5 % EX LOTN
1.0000 "application " | TOPICAL_LOTION | Freq: Three times a day (TID) | CUTANEOUS | Status: DC | PRN
  Administered 2019-04-04: 1 via TOPICAL
  Filled 2019-03-28: qty 222

## 2019-03-28 MED ORDER — FENTANYL CITRATE (PF) 100 MCG/2ML IJ SOLN
50.0000 ug | Freq: Once | INTRAMUSCULAR | Status: AC
  Administered 2019-03-28: 50 ug via INTRAVENOUS
  Filled 2019-03-28: qty 2

## 2019-03-28 MED ORDER — SODIUM CHLORIDE 0.9 % IV SOLN
2.0000 g | INTRAVENOUS | Status: AC
  Administered 2019-03-28: 2 g via INTRAVENOUS
  Filled 2019-03-28 (×2): qty 2

## 2019-03-28 MED ORDER — IPRATROPIUM-ALBUTEROL 0.5-2.5 (3) MG/3ML IN SOLN
RESPIRATORY_TRACT | Status: AC
  Administered 2019-03-28: 3 mL
  Filled 2019-03-28: qty 3

## 2019-03-28 NOTE — Progress Notes (Signed)
Dr. Lorin Mercy notified of patient jerking and slurring/stuttering speech. Dr. Lorin Mercy requested contacting nephrology/dialysis so he can go to dialysis ASAP.

## 2019-03-28 NOTE — Progress Notes (Signed)
Received patient from ED. Patient alert and oriented. Complaining of being hungry. Patient made comfortable in bed oriented to call bell. Will continue to monitor.

## 2019-03-28 NOTE — Consult Note (Signed)
[place] Nurse wound consult note Consultation was completed by review of records, images and assistance from the bedside nurse/clinical staff.  Reason for Consult: LE wounds This patient is known to the [facility] service from previous admission and I last saw the the patient in Aug.  The wounds have progressed and are extensive over the RLE and it is noted that the patient has similar presentation noted on the left inner thigh seen in the images taken during this admission Vascular did not feel the wounds were arterial in nature at the time of the last admission.  I requested a punch bx, to consider the confirmation of calciphylaxis in ESRD patient.  This was performed by general surgery 02/23/19: Skin , right lower extremity - NECROTIC TISSUE WITH ACUTE INFLAMMATION  Wound type: Unclear etiology; progressive necrotizing ulcerations of the right LE and left thigh. 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 Thanks  Dalonte Hardage [employer], RN,CWOCN, CNS, [place] [phone])

## 2019-03-28 NOTE — Progress Notes (Signed)
Initial Nutrition Assessment  DOCUMENTATION CODES:   Morbid obesity  INTERVENTION:   -Continue renal MVI daily -30 ml Prostat TID, each supplement provides 100 kcals and 15 grams protein  NUTRITION DIAGNOSIS:   Increased nutrient needs related to wound healing as evidenced by estimated needs.  GOAL:   Patient will meet greater than or equal to 90% of their needs  MONITOR:   PO intake, Supplement acceptance, Labs, Weight trends, Skin, I & O's  REASON FOR ASSESSMENT:   Consult Wound healing  ASSESSMENT:   Michael Riggs is a 48 y.o. male with medical history significant of HTN; HLD; and ESRD on TTS HD presenting with SOB.  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   He wanted pain medicine that day due to R leg pain.  +SOB, starting last night.  +cough.  +fever.  R leg infection.  Pt admitted with wound infection with concern for sepsis.   Per [facility] notes, pt with necrotic ulcerations on lt thigh and RLE with concern for calciphylaxis (per PV navigator, pt may require another punch biopsy). Per orthopedics notes, pt may require rt BKA.   Pt out of room at time of visit. Unable to obtain further nutrition-related history at this time.   Reviewed wt hx; noted pt has experienced a 5.5% wt loss over the past 3 months.   Lab Results  Component Value Date   HGBA1C 5.0 01/31/2019   PTA DM medications are none.   Labs reviewed.  Diet Order:   Diet Order            Diet renal with fluid restriction Fluid restriction: 1200 mL Fluid; Room service appropriate? Yes; Fluid consistency: Thin  Diet effective now              EDUCATION NEEDS:   No education needs have been identified at this time  Skin:  Skin Assessment: Skin Integrity Issues: Skin Integrity Issues:: Other (Comment) Other: RLE and lt thigh necrotic tissue with acute inflammation, possible calciphylaxis  Last BM:  03/28/19  Height:   Ht Readings  from Last 1 Encounters:  03/28/19 5\' 10"  (1.778 m)    Weight:   Wt Readings from Last 1 Encounters:  03/28/19 123.1 kg    Ideal Body Weight:  70.6 kg  BMI:  Body mass index is 38.94 kg/m.  Estimated Nutritional Needs:   Kcal:  1900-2100  Protein:  115-130 grams  Fluid:  1000 ml +UOP    Ade Stmarie A. Jimmye Norman, RD, LDN, [place] Registered Dietitian II Certified Diabetes Care and Education Specialist Pager: [phone] After hours Pager: [phone]

## 2019-03-28 NOTE — Procedures (Signed)
Patient seen on Hemodialysis. BP 111/87 (BP Location: Right Arm)   Pulse 86   Temp 98.9 F (37.2 C) (Oral)   Resp 18   Ht 5\' 10"  (1.778 m)   Wt 120.2 kg   SpO2 100%   BMI 38.02 kg/m   QB 400, UF goal 5L Tolerating treatment without complaints at this time.   Elmarie Shiley MD [facility]. Office # [phone] Pager # [phone] 1:07 PM

## 2019-03-28 NOTE — ED Provider Notes (Signed)
[facility] EMERGENCY DEPARTMENT Provider Note   CSN: [id number] Arrival date & time:        History   Chief Complaint Chief Complaint  Patient presents with  . malaise, right leg pain, cough    HPI Michael Riggs is a 48 y.o. male.     HPI  48 year old with history of diabetes, hypertension, CKD/ESRD comes in a chief complaint of weakness, right leg pain and cough.  Patient is residing at a nursing facility.  According to the nursing home staff patient has been getting confused and had a fever.  Patient reports that he has been having some pain in his leg and that the nursing home staff did not change the dressing in his leg as ordered.  Patient admits to having a cough that is mainly dry. No n/v/f/c.   Past Medical History:  Diagnosis Date  . Anemia   . Chronic kidney disease    Stage 5  . Depression   . Diabetes mellitus without complication ([place])   . Dyspnea   . GERD (gastroesophageal reflux disease)   . Hypertension     Patient Active Problem List   Diagnosis Date Noted  . Pruritus   . Scrotal pain   . Labile blood pressure   . Scrotal edema   . Status post below-knee amputation of left lower extremity ([place])   . Slow transit constipation   . Leukocytosis   . Sleep disturbance   . Essential hypertension   . Anemia of chronic disease   . Controlled type 2 diabetes mellitus with hyperglycemia, without long-term current use of insulin ([place])   . ESRD on dialysis ([place])   . Urinary retention   . Debility 02/09/2019  . Fluid overload 02/04/2019  . Hyperkalemia 01/30/2019  . ARF (acute renal failure) ([place]) 01/30/2019  . Hypokalemia 01/30/2019  . Hypoglycemia 01/30/2019  . Syphilis 01/30/2019  . Macrocytic anemia 01/30/2019  . End stage renal disease ([place])   . PDR (proliferative diabetic retinopathy) ([place]) 12/14/2013  . Venous hypertension 09/22/2013  . Diabetes mellitus type 2 in obese ([place]) 07/04/2013  . Habitual alcohol use  07/04/2013  . Obesity, Class II, BMI 35-39.9, with comorbidity 07/04/2013    Past Surgical History:  Procedure Laterality Date  . AV FISTULA PLACEMENT Left 01/09/2019   Procedure: ARTERIOVENOUS (AV) FISTULA CREATION LEFT UPPER ARM;  Surgeon: [name], MD;  Location: [facility] OR;  Service: Vascular;  Laterality: Left;  . below the knee amputation Left   . EYE SURGERY    . IR FLUORO GUIDE CV LINE RIGHT  01/31/2019  . IR FLUORO GUIDE CV LINE RIGHT  02/03/2019  . IR US GUIDE VASC ACCESS RIGHT  01/31/2019  . IR US GUIDE VASC ACCESS RIGHT  02/03/2019        Home Medications    Prior to Admission medications   Medication Sig Start Date End Date Taking? Authorizing Provider  acetaminophen (TYLENOL) 325 MG tablet Take 2 tablets (650 mg total) by mouth every 6 (six) hours as needed for mild pain (or Fever >/= 101). 03/13/19   Angiulli, Lavon Paganini, PA-C  atorvastatin (LIPITOR) 40 MG tablet Take 1 tablet (40 mg total) by mouth daily. 03/13/19   Angiulli, Lavon Paganini, PA-C  bethanechol (URECHOLINE) 25 MG tablet Take 1 tablet (25 mg total) by mouth 3 (three) times daily. 03/13/19   Angiulli, Lavon Paganini, PA-C  bisacodyl (DULCOLAX) 10 MG suppository Place 1 suppository (10 mg total) rectally daily as needed  for moderate constipation. 03/13/19   Angiulli, Lavon Paganini, PA-C  calcitRIOL (ROCALTROL) 0.25 MCG capsule Take 1 capsule (0.25 mcg total) by mouth every Monday, Wednesday, and Friday at 6 PM. 03/13/19   Angiulli, Lavon Paganini, PA-C  citalopram (CELEXA) 40 MG tablet Take 40 mg by mouth daily.  12/13/16   [provider]  furosemide (LASIX) 80 MG tablet Take 1 tablet (80 mg total) by mouth daily. 03/13/19   Angiulli, Lavon Paganini, PA-C  gabapentin (NEURONTIN) 300 MG capsule Take 1 capsule (300 mg total) by mouth at bedtime. 02/08/19   Alma Friendly, MD  Melatonin 3 MG TABS Take 1.5 tablets (4.5 mg total) by mouth at bedtime. 02/08/19   Alma Friendly, MD  methocarbamol (ROBAXIN) 500 MG tablet Take 1 tablet  (500 mg total) by mouth every 8 (eight) hours as needed for muscle spasms. 03/13/19   Angiulli, Lavon Paganini, PA-C  pantoprazole (PROTONIX) 40 MG tablet Take 1 tablet (40 mg total) by mouth daily. 03/13/19   Angiulli, Lavon Paganini, PA-C  polyethylene glycol (MIRALAX / GLYCOLAX) 17 g packet Take 17 g by mouth daily. 02/09/19   Alma Friendly, MD  senna-docusate (SENOKOT-S) 8.6-50 MG tablet Take 1 tablet by mouth 2 (two) times daily. 02/08/19   Alma Friendly, MD  tamsulosin (FLOMAX) 0.4 MG CAPS capsule Take 1 capsule (0.4 mg total) by mouth daily after breakfast. 03/13/19   Angiulli, Lavon Paganini, PA-C  traMADol (ULTRAM) 50 MG tablet Take 1 tablet (50 mg total) by mouth every 6 (six) hours as needed for moderate pain or severe pain. 03/13/19   Angiulli, Lavon Paganini, PA-C  vancomycin (VANCOCIN) 1-5 GM/200ML-% SOLN Inject 200 mLs (1,000 mg total) into the vein every Monday, Wednesday, and Friday with hemodialysis. 03/13/19   Angiulli, Lavon Paganini, PA-C    Family History No family history on file.  Social History Social History   Tobacco Use  . Smoking status: Former Smoker    Types: Cigarettes    Start date: 11/18/2018    Quit date: 01/18/2019    Years since quitting: 0.1  . Smokeless tobacco: Never Used  . Tobacco comment: pt stated got bored with it  Substance Use Topics  . Alcohol use: Not Currently    Comment: heavy drinker in the past, none since 07/19/18  . Drug use: Not Currently     Allergies   Patient has no known allergies.   Review of Systems Review of Systems  Constitutional: Positive for activity change.  Respiratory: Positive for cough. Negative for shortness of breath.   Cardiovascular: Negative for chest pain.  Gastrointestinal: Negative for abdominal pain.  Skin: Positive for rash and wound.  Allergic/Immunologic: Positive for immunocompromised state.  Hematological: Does not bruise/bleed easily.  All other systems reviewed and are negative.    Physical Exam Updated Vital  Signs BP (!) 169/151   Pulse 86   Temp 100.1 F (37.8 C) (Oral)   Resp 18   Ht 5\' 10"  (1.778 m)   Wt 120.2 kg   SpO2 (!) 85%   BMI 38.02 kg/m   Physical Exam Vitals signs and nursing note reviewed.  Constitutional:      Appearance: He is well-developed.  HENT:     Head: Atraumatic.  Neck:     Musculoskeletal: Neck supple.  Cardiovascular:     Rate and Rhythm: Normal rate.  Pulmonary:     Effort: Pulmonary effort is normal.  Musculoskeletal:     Comments: Patient has left lower extremity  BKA. The right lower extremity has a large open wound.  There is necrotic appearing tissue that we can appreciate.  There is also drainage that appears exudative.  Patient has 2+ dorsalis pedis.  Skin:    General: Skin is warm.  Neurological:     Mental Status: He is alert and oriented to person, place, and time.      ED Treatments / Results  Labs (all labs ordered are listed, but only abnormal results are displayed) Labs Reviewed  COMPREHENSIVE METABOLIC PANEL - Abnormal; Notable for the following components:      Result Value   Chloride 95 (*)    CO2 21 (*)    BUN 74 (*)    Creatinine, Ser 9.93 (*)    Calcium 8.5 (*)    Total Protein 9.2 (*)    Albumin 2.3 (*)    GFR calc non Af Amer 6 (*)    GFR calc Af Amer 6 (*)    Anion gap 19 (*)    All other components within normal limits  CBC WITH DIFFERENTIAL/PLATELET - Abnormal; Notable for the following components:   WBC 14.6 (*)    RBC 2.60 (*)    Hemoglobin 7.1 (*)    HCT 23.8 (*)    MCHC 29.8 (*)    Neutro Abs 10.1 (*)    Monocytes Absolute 1.3 (*)    Eosinophils Absolute 0.8 (*)    Abs Immature Granulocytes 0.09 (*)    All other components within normal limits  PROTIME-INR - Abnormal; Notable for the following components:   Prothrombin Time 16.2 (*)    INR 1.3 (*)    All other components within normal limits  CULTURE, BLOOD (ROUTINE X 2)  CULTURE, BLOOD (ROUTINE X 2)  SARS CORONAVIRUS 2 (HOSPITAL ORDER, [facility])  URINE CULTURE  LACTIC ACID, PLASMA  APTT  VANCOMYCIN, RANDOM  LACTIC ACID, PLASMA  URINALYSIS, ROUTINE W REFLEX MICROSCOPIC  TYPE AND SCREEN    EKG EKG Interpretation  Date/Time:  Tuesday March 28 2019 06:37:47 EDT Ventricular Rate:  86 PR Interval:    QRS Duration: 94 QT Interval:  413 QTC Calculation: 494 R Axis:   96 Text Interpretation:  Sinus rhythm Borderline right axis deviation Borderline low voltage, extremity leads Borderline prolonged QT interval No acute changes No significant change since last tracing Confirmed by Varney Biles [phone]) on 03/28/2019 7:04:03 AM   Radiology Dg Tibia/fibula Right  Result Date: 03/28/2019 CLINICAL DATA:  Right leg pain and burning sensation EXAM: RIGHT TIBIA AND FIBULA - 2 VIEW COMPARISON:  None. FINDINGS: There is no evidence of fracture or other focal bone lesions. Soft tissues are unremarkable. No soft tissue emphysema. Peripheral vascular atherosclerotic disease. IMPRESSION: No acute osseous abnormality of the right tibia and fibula. Electronically Signed   By: Kathreen Devoid   On: 03/28/2019 06:54   Dg Chest Port 1 View  Result Date: 03/28/2019 CLINICAL DATA:  Acute dyspnea, malaise, cough, chest discomfort EXAM: PORTABLE CHEST 1 VIEW COMPARISON:  03/04/2019 FINDINGS: There is a dual lumen right-sided central venous catheter with the tip projecting over the SVC. There is no focal consolidation. There is no pleural effusion or pneumothorax. The heart and mediastinal contours are stable. There is no acute osseous abnormality. IMPRESSION: No active disease. Electronically Signed   By: Kathreen Devoid   On: 03/28/2019 06:52    Procedures .Critical Care Performed by: Varney Biles, MD Authorized by: Varney Biles, MD   Critical care  provider statement:    Critical care time (minutes):  36   Critical care time was exclusive of:  Separately billable procedures and treating other patients   Critical care  was necessary to treat or prevent imminent or life-threatening deterioration of the following conditions:  Renal failure, sepsis and CNS failure or compromise   Critical care was time spent personally by me on the following activities:  Discussions with consultants, evaluation of patient's response to treatment, examination of patient, ordering and performing treatments and interventions, ordering and review of laboratory studies, ordering and review of radiographic studies, pulse oximetry, re-evaluation of patient's condition, obtaining history from patient or surrogate and review of old charts   (including critical care time)  Medications Ordered in ED Medications  cefTRIAXone (ROCEPHIN) 2 g in sodium chloride 0.9 % 100 mL IVPB (2 g Intravenous New Bag/Given 03/28/19 0636)  fentaNYL (SUBLIMAZE) injection 50 mcg (50 mcg Intravenous Given 03/28/19 0707)     Initial Impression / Assessment and Plan / ED Course  I have reviewed the triage vital signs and the nursing notes.  Pertinent labs & imaging results that were available during my care of the patient were reviewed by me and considered in my medical decision making (see chart for details).        48 year old male comes in a chief complaint of cough, fever, confusion. He has history of diabetes, ESRD on hemodialysis and left lower extremity BKA with open wound in the right lower extremity.  Differential diagnosis includes cellulitis, osteomyelitis, pneumonia, bronchitis. Patient is actually AO x3 for me.  He has no meningismus.  The wound in the leg looks dirty, but the dialysis site looks fine.  Sepsis work-up initiated.  Final Clinical Impressions(s) / ED Diagnoses   Final diagnoses:  None    ED Discharge Orders    None       Varney Biles, MD 03/28/19 0730

## 2019-03-28 NOTE — Consult Note (Signed)
PV Navigator Consult acknowledged and chart reviewed. Patient is in dialysis at this time.   Admitted through [facility] emergency dept 03/28/2019 with wound infection right lower extremity. Pictures show full thickness wounds right lower leg with eschar and slough. Etilology unknown although wounds were not felt to be arterial in origin by Vascular from last admission. Patient also received a punch biopsy to rule out calcyphylaxis last admission due to ESRD and results were negative. I noted that [facility] requested additional punch biopsy this admission due to new evolving wound to left inner thigh per wound pictures obtained on this admission.   Past medical history to include DM, ESRD on HD, HTN, anemia, habitual alcohol use and left below the knee amputation.   Pertinent Labs: WBC- 14.6  RBC- 2.60  Hgb- 7.1  HCT- 23.8  BUN-74  Creatinine- 9.93  Albumin - 2.3 Last hemoglobin a1c 01/31/2019- 5.0  Active consults in place include Social work, [place], Nutrition and ortho. He already receives [facility]reach for medication adherence.   Will continue to follow. Thank you for this consult.  Cletis Media RN BSN CWS [place] [phone]

## 2019-03-28 NOTE — Progress Notes (Signed)
Patient transferred to [facility] from HD. Report given to Mariea Clonts, [place].

## 2019-03-28 NOTE — Progress Notes (Signed)
Received patient from Dialysis.  Iv running. Dressing on right leg intact.  Will continue to monitor.

## 2019-03-28 NOTE — H&P (Signed)
History and Physical    Michael Riggs LSL:[id number] DOB: 1970-12-09 DOA: 03/28/2019  PCP: [name], MD Consultants:  None Patient coming from: [facility]; NOK: [place], [place], [phone]  Chief Complaint: Fever  HPI: Michael Riggs is a 48 y.o. male with medical history significant of HTN; HLD; and ESRD on TTS HD presenting with SOB.  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   He wanted pain medicine that day due to R leg pain.  +SOB, starting last night.  +cough.  +fever.  R leg infection.  Patient was cantankerous at the time of evaluation.  His English is somewhat difficult to understand, and an interpreter was offered which was highly offensive to the patient.  History was limited.  He was admitted from 7/13-17 for progressive renal dysfunction, new ESRD with uremia-induced metabolic encephalopathy.  He had TDC placed on 7/14 and started on HD.  He was discharged to home on 7/17 and returned to the ER and was admitted from 7/18-22 for anasarca and volume overload and was discharged to CIR on 7/22 because of generalized deconditioning with ambulatory dysfunction.  He was noted to have multiple skin tears and ulceration of the RLE and received wound care while inpatient.  He was admitted to CIR from 7/22-8/25 due to decreased mobility.  At the time of d/c, he had limited assistance at home and so the decision was made to discharge to SNF.  He went to [facility] on 8/25. His wounds appeared to be healing reasonably well at that time.  ED Course:  Fever, confusion starting yesterday with worsening cough, 85% with EMS and now ok on 2-3L.  Also worsening leg pain - has drainage with necrotic tissue, no apparent cellulitis and no gas on xray.  No apparent deep space infection.  COVID pending, none there at this time.  Now more alert and answering questions appropriately.  Review of Systems: As per HPI; otherwise  review of systems reviewed and negative.     Past Medical History:  Diagnosis Date  . Anemia   . Depression   . Diabetes mellitus with peripheral vascular disease ([place])   . Dyslipidemia   . Dyspnea   . ESRD (end stage renal disease) on dialysis ([place]) 01/2019   MWF  . GERD (gastroesophageal reflux disease)   . Hypertension   . Physical deconditioning   . Syphilis 01/2019    Past Surgical History:  Procedure Laterality Date  . AV FISTULA PLACEMENT Left 01/09/2019   Procedure: ARTERIOVENOUS (AV) FISTULA CREATION LEFT UPPER ARM;  Surgeon: [name], MD;  Location: [facility] OR;  Service: Vascular;  Laterality: Left;  . below the knee amputation Left   . EYE SURGERY    . IR FLUORO GUIDE CV LINE RIGHT  01/31/2019  . IR FLUORO GUIDE CV LINE RIGHT  02/03/2019  . IR US GUIDE VASC ACCESS RIGHT  01/31/2019  . IR US GUIDE VASC ACCESS RIGHT  02/03/2019    Social History   Socioeconomic History  . Marital status: Divorced    Spouse name: Not on file  . Number of children: Not on file  . Years of education: Not on file  . Highest education level: Not on file  Occupational History  . Not on file  Social Needs  . Financial resource strain: Not on file  . Food insecurity    Worry: Not on file    Inability: Not on  file  . Transportation needs    Medical: Not on file    Non-medical: Not on file  Tobacco Use  . Smoking status: Former Smoker    Types: Cigarettes    Start date: 11/18/2018    Quit date: 01/18/2019    Years since quitting: 0.1  . Smokeless tobacco: Never Used  . Tobacco comment: pt stated got bored with it  Substance and Sexual Activity  . Alcohol use: Not Currently    Comment: heavy drinker in the past, none since 07/19/18  . Drug use: Not Currently  . Sexual activity: Not Currently  Lifestyle  . Physical activity    Days per week: Not on file    Minutes per session: Not on file  . Stress: Not on file  Relationships  . Social Herbalist on phone: Not on  file    Gets together: Not on file    Attends religious service: Not on file    Active member of club or organization: Not on file    Attends meetings of clubs or organizations: Not on file    Relationship status: Not on file  . Intimate partner violence    Fear of current or ex partner: Not on file    Emotionally abused: Not on file    Physically abused: Not on file    Forced sexual activity: Not on file  Other Topics Concern  . Not on file  Social History Narrative  . Not on file    No Known Allergies  No family history on file.  Prior to Admission medications   Medication Sig Start Date End Date Taking? Authorizing Provider  acetaminophen (TYLENOL) 325 MG tablet Take 2 tablets (650 mg total) by mouth every 6 (six) hours as needed for mild pain (or Fever >/= 101). 03/13/19   Angiulli, Lavon Paganini, PA-C  atorvastatin (LIPITOR) 40 MG tablet Take 1 tablet (40 mg total) by mouth daily. 03/13/19   Angiulli, Lavon Paganini, PA-C  bethanechol (URECHOLINE) 25 MG tablet Take 1 tablet (25 mg total) by mouth 3 (three) times daily. 03/13/19   Angiulli, Lavon Paganini, PA-C  bisacodyl (DULCOLAX) 10 MG suppository Place 1 suppository (10 mg total) rectally daily as needed for moderate constipation. 03/13/19   Angiulli, Lavon Paganini, PA-C  calcitRIOL (ROCALTROL) 0.25 MCG capsule Take 1 capsule (0.25 mcg total) by mouth every Monday, Wednesday, and Friday at 6 PM. 03/13/19   Angiulli, Lavon Paganini, PA-C  citalopram (CELEXA) 40 MG tablet Take 40 mg by mouth daily.  12/13/16   [provider]  furosemide (LASIX) 80 MG tablet Take 1 tablet (80 mg total) by mouth daily. 03/13/19   Angiulli, Lavon Paganini, PA-C  gabapentin (NEURONTIN) 300 MG capsule Take 1 capsule (300 mg total) by mouth at bedtime. 02/08/19   Alma Friendly, MD  Melatonin 3 MG TABS Take 1.5 tablets (4.5 mg total) by mouth at bedtime. 02/08/19   Alma Friendly, MD  methocarbamol (ROBAXIN) 500 MG tablet Take 1 tablet (500 mg total) by mouth every 8  (eight) hours as needed for muscle spasms. 03/13/19   Angiulli, Lavon Paganini, PA-C  pantoprazole (PROTONIX) 40 MG tablet Take 1 tablet (40 mg total) by mouth daily. 03/13/19   Angiulli, Lavon Paganini, PA-C  polyethylene glycol (MIRALAX / GLYCOLAX) 17 g packet Take 17 g by mouth daily. 02/09/19   Alma Friendly, MD  senna-docusate (SENOKOT-S) 8.6-50 MG tablet Take 1 tablet by mouth 2 (two) times daily. 02/08/19  Alma Friendly, MD  tamsulosin [facility]) 0.4 MG CAPS capsule Take 1 capsule (0.4 mg total) by mouth daily after breakfast. 03/13/19   Angiulli, Lavon Paganini, PA-C  traMADol (ULTRAM) 50 MG tablet Take 1 tablet (50 mg total) by mouth every 6 (six) hours as needed for moderate pain or severe pain. 03/13/19   Angiulli, Lavon Paganini, PA-C  vancomycin (VANCOCIN) 1-5 GM/200ML-% SOLN Inject 200 mLs (1,000 mg total) into the vein every Monday, Wednesday, and Friday with hemodialysis. 03/13/19   Cathlyn Parsons, PA-C    Physical Exam: Vitals:   03/28/19 0730 03/28/19 0800 03/28/19 0815 03/28/19 0944  BP: 94/78 107/72 117/70 111/87  Pulse: 85   86  Resp: _0 Temp:    98.9 F (37.2 C)  TempSrc:    Oral  SpO2: 99%   100%  Weight:      Height:         . General:  Appears calm and comfortable and is NAD; he is quite cantankerous . Eyes:  PERRL, EOMI, normal lids, iris . ENT:  grossly normal hearing, lips & tongue, mmm . Neck:  no LAD, masses or thyromegaly; no carotid bruits . Cardiovascular:  RRR, no m/r/g. No LE edema.  Marland Kitchen Respiratory:   CTA bilaterally with no wheezes/rales/rhonchi.  Normal respiratory effort.  No hypoxia on [place] O2. . Abdomen:  soft, NT, ND, NABS . Skin:  See MSK below Musculoskeletal:  S/p L BKA with well healing stump RLE with diffuse eschar of most of the lower leg with purulent drainage that is foul-smelling         . Lower extremity:  No LE edema.  Limited foot exam with no ulcerations.  2+ distal pulses. Marland Kitchen Psychiatric:  grossly normal mood and affect, speech  fluent and appropriate, AOx3 . Neurologic:  CN 2-12 grossly intact, moves all extremities in coordinated fashion, sensation intact    Radiological Exams on Admission: Dg Tibia/fibula Right  Result Date: 03/28/2019 CLINICAL DATA:  Right leg pain and burning sensation EXAM: RIGHT TIBIA AND FIBULA - 2 VIEW COMPARISON:  None. FINDINGS: There is no evidence of fracture or other focal bone lesions. Soft tissues are unremarkable. No soft tissue emphysema. Peripheral vascular atherosclerotic disease. IMPRESSION: No acute osseous abnormality of the right tibia and fibula. Electronically Signed   By: Kathreen Devoid   On: 03/28/2019 06:54   Dg Chest Port 1 View  Result Date: 03/28/2019 CLINICAL DATA:  Acute dyspnea, malaise, cough, chest discomfort EXAM: PORTABLE CHEST 1 VIEW COMPARISON:  03/04/2019 FINDINGS: There is a dual lumen right-sided central venous catheter with the tip projecting over the SVC. There is no focal consolidation. There is no pleural effusion or pneumothorax. The heart and mediastinal contours are stable. There is no acute osseous abnormality. IMPRESSION: No active disease. Electronically Signed   By: Kathreen Devoid   On: 03/28/2019 06:52    EKG: Independently reviewed.  NSR with rate 86; nonspecific ST changes with no evidence of acute ischemia; NSCSLT   Labs on Admission: I have personally reviewed the available labs and imaging studies at the time of the admission.  Pertinent labs:   CO2 21 BUN 74/Creatinine 9.93/GFR 6 Anion gap 19 Lactate 1.4 WBC 14.6 Hgb 7.1 INR 1.3 COVID negative today, 8/24, 7/20, 7/13, 6/22, 6/12   Assessment/Plan Principal Problem:   Wound infection Active Problems:   Hypertension   ESRD (end stage renal disease) on dialysis ([place])   Dyslipidemia   Diabetes mellitus with  peripheral vascular disease ([place])   Depression   Physical deconditioning   Wound infection -Patient presented with concern for sepsis- reported fever, hypoxia requiring new [place]  O2, leukocytosis -Initial lactate was normal but repeat was 2.2 -His SOB and hypoxia may be associated with wound infection, as his lung sound clear and his CXR was normal -COVID negative -For now, source of infection appears to be his RLE with purulent and foul-smelling drainage -Xray is negative for obvious osteo or subcutaneous air but the LE is quite concerning -Will treat with IV antibiotics (Rocephin, Flagyl, Vanc as per the lower extremity wound infection algorithm) -Orthopedics consulted, although vascular would also be a reasonable consideration -I am concerned that the patient may be heading for additional amputation -LE wound order set utilized including labs (CRP, ESR, prealbumin, and blood cultures) and consults (CM; diabetes coordinator; peripheral vascular navigator; SW; wound care; and nutrition) -He is hemodynamically stable and also stable on 2L  O2 at this time; will admit to Med Surg at this time -His poor history skills appear to be the result of language barrier, acute on chronic illness, and possibly depression; if there are ongoing concerns, consider further neurologic evaluation - currently low concern for acute encephalopathy  ESRD -Patient on chronic MWF HD -Nephrology prn order set utilized -He does not appear to be volume overloaded or otherwise in need of acute HD -Maintenance HD telephone line called and left message that patient will need HD -AV fistula placed on 6/22, second stage surgery currently planned for 10/1  DM -Recent A1c 5 -He does not appear to require surveillance or treatment for DM at this time  HTN -Continue Norvasc  HLD -Continue Lipitor  Recent syphilis -Treated with PCN at the health department in early July -Will need ongoing monitoring -If ongoing concern about mental status, consider evaluation for neurosyphilis  Depression   Deconditioning   Note: This patient has been tested and is negative for the novel coronavirus  COVID-19.  DVT prophylaxis: Heparin Code Status:  Full - confirmed with patient Family Communication: None present Disposition Plan: Likely back to SNF Consults called: Orthopedics; nephrology; peripheral vascular navigator; SW; wound care; and nutrition Admission status: Admit - It is my clinical opinion that admission to INPATIENT is reasonable and necessary because of the expectation that this patient will require hospital care that crosses at least 2 midnights to treat this condition based on the medical complexity of the problems presented.  Given the aforementioned information, the predictability of an adverse outcome is felt to be significant.     Karmen Bongo MD [facility]   How to contact the [facility] Attending or Consulting provider [place] or covering provider during after hours [place], for this patient?  1. Check the care team in [facility] and look for a) attending/consulting [facility] provider listed and b) the [facility] team listed 2. Log into [email or web address] and use [place]'s universal password to access. If you do not have the password, please contact the hospital operator. 3. Locate the [facility] provider you are looking for under [facility] and page to a number that you can be directly reached. 4. If you still have difficulty reaching the provider, please page the [facility] (Director on Call) for the Hospitalists listed on amion for assistance.   03/28/2019, 10:53 AM

## 2019-03-28 NOTE — Progress Notes (Signed)
Patient has  Eschar on right leg from knee to ankle.  Has wound on inner and outer thigh.  Wrapped lower leg with non adhesive wrap and gauze.

## 2019-03-28 NOTE — ED Triage Notes (Signed)
Pt arrived via [place] from [facility] complaining of acute dyspnea, malaise, cough, chest discomfort, and right leg pain/burning sensation. VS WNL except for SPO2 90% RA. Nursing home wants him evaluated for sepsis workup. Due for dialysis this AM.

## 2019-03-28 NOTE — Progress Notes (Signed)
Patient transported to HD. Report given to [facility], RN

## 2019-03-28 NOTE — Progress Notes (Signed)
Patient with progressive stuttering speech.  Had mild myoclonic jerks at the time of my evaluation and this appears to be progressing, as well.  Likely needs ASAP HD due to developing uremia.  Nephrology has been notified.   Carlyon Shadow, M.D.

## 2019-03-28 NOTE — Progress Notes (Signed)
CRITICAL VALUE ALERT  Critical Value:  Lactic Acid 2.2  Date & Time Notied:  03/28/19, 1019  Provider Notified: Dr. Lorin Mercy   Orders Received/Actions taken: Continue to monitor. Will repeat lactic.

## 2019-03-28 NOTE — Consult Note (Signed)
Pharmacy Antibiotic Note  Michael Riggs is a 48 y.o. male admitted on 03/28/2019 with LE wound infection.  Pharmacy has been consulted for vanc/cefepime dosing.  PT has CKD/ESRD, CrCl 12.1 mL/min. WBC 14.6, afeb  Plan: Vanc 2000 mg IV x1 then vanc 1000 mg MWF qHD Cefepime 2 g IV x1 then cefepime 2 g MWF qHD  Height: 5\' 10"  (177.8 cm) Weight: 271 lb 6.2 oz (123.1 kg) IBW/kg (Calculated) : 73  Temp (24hrs), Avg:99.4 F (37.4 C), Min:98.9 F (37.2 C), Max:100.1 F (37.8 C)  Recent Labs  Lab 03/28/19 0538 03/28/19 0933 03/28/19 1352  WBC 14.6*  --   --   CREATININE 9.93*  --   --   LATICACIDVEN 1.4 2.2*  --   VANCORANDOM 7  --  5    Estimated Creatinine Clearance: 12.1 mL/min (A) (by C-G formula based on SCr of 9.93 mg/dL (H)).    No Known Allergies  Antimicrobials this admission: 9/8 Flagy >>  9/8 Cefepime >>  9/8 Vanc >>  Dose adjustments this admission:   Microbiology results: 9/8 BCx: pending 9/8 UCx: sent  Thank you for allowing pharmacy to be a part of this patient's care.  Gwenlyn Fudge 03/28/2019 2:53 PM

## 2019-03-28 NOTE — Consult Note (Signed)
WOC will hold off on any topical care until after orthopedic evaluation occurs and recommendations from that service occur  [place], [place], [place]

## 2019-03-28 NOTE — Consult Note (Addendum)
[facility] Renal Consultation Note    Indication for Consultation:  Management of ESRD/hemodialysis; anemia, hypertension/volume and secondary hyperparathyroidism PCP:  HPI: Michael Riggs is a 48 y.o. male with ESRD relatively new start on HD since July of this year s/p recent d/c from CIR to SNF who presents today with worsening right leg pain from wounds and c/o of SOB, cough and fever.   Punch bx 02/22/19 showed necrotic tissue with inflammation but no calciphylaxis per se therefore he continued on calcitriol. Marland Kitchen PMHx also significant forDM, HTN,  left BKA, recent enterococcal UTI - last Vanc 8/31, scrotal edema and anasarca last admission, bladder outlet obstruction and edema of foreskin/;/scrotum. Latent syphilis, severe deconditioning with d/c to SNF.  Last dialysis treatment was 9/4 and he ran his full time with a net UF of 5 L and post wt 119.5 (EDW 120- staff titrating volume down). His lowest BP was 109/85.  He missed his Monday HD treatment. He has been prone to high IDWG 4-5 kg at least.    History is somewhat difficult to obtain.  He in summary was a recent d/c from CIR to SNF having previously lved at home with his mother prior to events of July/August. He tells me he was d/c to [facility]. The admission H & P indicates [facility]. During my discussion with him, his O2 was off "I don't need it now" he was supine in bed. It sounds like from what he describes he has been on CPAP in the past.  He was noted to have some myoclonus. Upon query. He indicates that is normal but I'm not sure that is true.  Past Medical History:  Diagnosis Date  . Anemia   . Depression   . Diabetes mellitus with peripheral vascular disease ([place])   . Dyslipidemia   . Dyspnea   . ESRD (end stage renal disease) on dialysis ([place]) 01/2019   MWF  . GERD (gastroesophageal reflux disease)   . Hypertension   . Physical deconditioning   . Syphilis 01/2019   Past Surgical History:  Procedure  Laterality Date  . AV FISTULA PLACEMENT Left 01/09/2019   Procedure: ARTERIOVENOUS (AV) FISTULA CREATION LEFT UPPER ARM;  Surgeon: [name], MD;  Location: [facility] OR;  Service: Vascular;  Laterality: Left;  . below the knee amputation Left   . EYE SURGERY    . IR FLUORO GUIDE CV LINE RIGHT  01/31/2019  . IR FLUORO GUIDE CV LINE RIGHT  02/03/2019  . IR US GUIDE VASC ACCESS RIGHT  01/31/2019  . IR US GUIDE VASC ACCESS RIGHT  02/03/2019   No family history on file. Social History:  reports that he quit smoking about 2 months ago. His smoking use included cigarettes. He started smoking about 4 months ago. He has never used smokeless tobacco. He reports previous alcohol use. He reports previous drug use. No Known Allergies Prior to Admission medications   Medication Sig Start Date End Date Taking? Authorizing Provider  acetaminophen (TYLENOL) 325 MG tablet Take 2 tablets (650 mg total) by mouth every 6 (six) hours as needed for mild pain (or Fever >/= 101). 03/13/19   Angiulli, Lavon Paganini, PA-C  atorvastatin (LIPITOR) 40 MG tablet Take 1 tablet (40 mg total) by mouth daily. 03/13/19   Angiulli, Lavon Paganini, PA-C  bethanechol (URECHOLINE) 25 MG tablet Take 1 tablet (25 mg total) by mouth 3 (three) times daily. 03/13/19   Angiulli, Lavon Paganini, PA-C  bisacodyl (DULCOLAX) 10 MG suppository Place  1 suppository (10 mg total) rectally daily as needed for moderate constipation. 03/13/19   Angiulli, Lavon Paganini, PA-C  calcitRIOL (ROCALTROL) 0.25 MCG capsule Take 1 capsule (0.25 mcg total) by mouth every Monday, Wednesday, and Friday at 6 PM. 03/13/19   Angiulli, Lavon Paganini, PA-C  citalopram (CELEXA) 40 MG tablet Take 40 mg by mouth daily.  12/13/16   [provider]  furosemide (LASIX) 80 MG tablet Take 1 tablet (80 mg total) by mouth daily. 03/13/19   Angiulli, Lavon Paganini, PA-C  gabapentin (NEURONTIN) 300 MG capsule Take 1 capsule (300 mg total) by mouth at bedtime. 02/08/19   Alma Friendly, MD  Melatonin 3 MG TABS  Take 1.5 tablets (4.5 mg total) by mouth at bedtime. 02/08/19   Alma Friendly, MD  methocarbamol (ROBAXIN) 500 MG tablet Take 1 tablet (500 mg total) by mouth every 8 (eight) hours as needed for muscle spasms. 03/13/19   Angiulli, Lavon Paganini, PA-C  pantoprazole (PROTONIX) 40 MG tablet Take 1 tablet (40 mg total) by mouth daily. 03/13/19   Angiulli, Lavon Paganini, PA-C  polyethylene glycol (MIRALAX / GLYCOLAX) 17 g packet Take 17 g by mouth daily. 02/09/19   Alma Friendly, MD  senna-docusate (SENOKOT-S) 8.6-50 MG tablet Take 1 tablet by mouth 2 (two) times daily. 02/08/19   Alma Friendly, MD  tamsulosin (FLOMAX) 0.4 MG CAPS capsule Take 1 capsule (0.4 mg total) by mouth daily after breakfast. 03/13/19   Angiulli, Lavon Paganini, PA-C  traMADol (ULTRAM) 50 MG tablet Take 1 tablet (50 mg total) by mouth every 6 (six) hours as needed for moderate pain or severe pain. 03/13/19   Angiulli, Lavon Paganini, PA-C  vancomycin (VANCOCIN) 1-5 GM/200ML-% SOLN Inject 200 mLs (1,000 mg total) into the vein every Monday, Wednesday, and Friday with hemodialysis. 03/13/19   Angiulli, Lavon Paganini, PA-C   Current Facility-Administered Medications  Medication Dose Route Frequency Provider Last Rate Last Dose  . acetaminophen (TYLENOL) tablet 650 mg  650 mg Oral Q6H PRN Karmen Bongo, MD       Or  . acetaminophen (TYLENOL) suppository 650 mg  650 mg Rectal Q6H PRN Karmen Bongo, MD      . atorvastatin (LIPITOR) tablet 40 mg  40 mg Oral Daily Karmen Bongo, MD      . bethanechol (URECHOLINE) tablet 25 mg  25 mg Oral TID Karmen Bongo, MD      . bisacodyl (DULCOLAX) suppository 10 mg  10 mg Rectal Daily PRN Karmen Bongo, MD      . Derrill Memo ON 03/29/2019] calcitRIOL (ROCALTROL) capsule 0.25 mcg  0.25 mcg Oral Q M,W,F-1800 Karmen Bongo, MD      . calcium carbonate (dosed in mg elemental calcium) suspension 500 mg of elemental calcium  500 mg of elemental calcium Oral Q6H PRN Karmen Bongo, MD      . camphor-menthol  [facility]) lotion 1 application  1 application Topical [id number] PRN Karmen Bongo, MD       And  . hydrOXYzine (ATARAX/VISTARIL) tablet 25 mg  25 mg Oral Q8H PRN Karmen Bongo, MD      . ceFEPIme (MAXIPIME) 1 g in sodium chloride 0.9 % 100 mL IVPB  1 g Intravenous NOW Karren Cobble, RPH      . [START ON 03/29/2019] ceFEPIme (MAXIPIME) 2 g in sodium chloride 0.9 % 100 mL IVPB  2 g Intravenous Q M,W,F-HD Karren Cobble, RPH      . citalopram (CELEXA) tablet 40 mg  40 mg  Oral Daily Karmen Bongo, MD      . docusate sodium [facility]) enema 283 mg  1 enema Rectal PRN Karmen Bongo, MD      . feeding supplement (NEPRO CARB STEADY) liquid 237 mL  237 mL Oral TID PRN Karmen Bongo, MD      . gabapentin (NEURONTIN) capsule 300 mg  300 mg Oral QHS Karmen Bongo, MD      . heparin injection 5,000 Units  5,000 Units Subcutaneous Camelia Phenes Karmen Bongo, MD      . Melatonin TABS 4.5 mg  4.5 mg Oral QHS Karmen Bongo, MD      . methocarbamol (ROBAXIN) tablet 500 mg  500 mg Oral Q8H PRN Karmen Bongo, MD      . metroNIDAZOLE (FLAGYL) IVPB 500 mg  500 mg Intravenous Lynne Logan, MD      . ondansetron [facility]) tablet 4 mg  4 mg Oral Q6H PRN Karmen Bongo, MD       Or  . ondansetron [facility]) injection 4 mg  4 mg Intravenous Q6H PRN Karmen Bongo, MD      . pantoprazole (PROTONIX) EC tablet 40 mg  40 mg Oral Daily Karmen Bongo, MD      . polyethylene glycol (MIRALAX / GLYCOLAX) packet 17 g  17 g Oral Daily Karmen Bongo, MD      . senna-docusate (Senokot-S) tablet 1 tablet  1 tablet Oral BID Karmen Bongo, MD      . sorbitol 70 % solution 30 mL  30 mL Oral PRN Karmen Bongo, MD      . tamsulosin [facility]) capsule 0.4 mg  0.4 mg Oral QPC breakfast Karmen Bongo, MD      . traMADol Veat[facility] Bourbon) tablet 50 mg  50 mg Oral Q6H PRN Karmen Bongo, MD      . zolpidem (AMBIEN) tablet 5 mg  5 mg Oral QHS PRN Karmen Bongo, MD       Labs: Basic Metabolic Panel: Recent Labs  Lab  03/28/19 0538  NA 135  K 4.1  CL 95*  CO2 21*  GLUCOSE 78  BUN 74*  CREATININE 9.93*  CALCIUM 8.5*   Liver Function Tests: Recent Labs  Lab 03/28/19 0538  AST 15  ALT 23  ALKPHOS 125  BILITOT 1.0  PROT 9.2*  ALBUMIN 2.3*   No results for input(s): LIPASE, AMYLASE in the last 168 hours. No results for input(s): AMMONIA in the last 168 hours. CBC: Recent Labs  Lab 03/28/19 0538  WBC 14.6*  NEUTROABS 10.1*  HGB 7.1*  HCT 23.8*  MCV 91.5  PLT 360   Cardiac Enzymes: No results for input(s): CKTOTAL, CKMB, CKMBINDEX, TROPONINI in the last 168 hours. CBG: No results for input(s): GLUCAP in the last 168 hours. Iron Studies: No results for input(s): IRON, TIBC, TRANSFERRIN, FERRITIN in the last 72 hours. Studies/Results: Dg Tibia/fibula Right  Result Date: 03/28/2019 CLINICAL DATA:  Right leg pain and burning sensation EXAM: RIGHT TIBIA AND FIBULA - 2 VIEW COMPARISON:  None. FINDINGS: There is no evidence of fracture or other focal bone lesions. Soft tissues are unremarkable. No soft tissue emphysema. Peripheral vascular atherosclerotic disease. IMPRESSION: No acute osseous abnormality of the right tibia and fibula. Electronically Signed   By: Kathreen Devoid   On: 03/28/2019 06:54   Dg Chest Port 1 View  Result Date: 03/28/2019 CLINICAL DATA:  Acute dyspnea, malaise, cough, chest discomfort EXAM: PORTABLE CHEST 1 VIEW COMPARISON:  03/04/2019 FINDINGS: There is a dual lumen right-sided central venous  catheter with the tip projecting over the SVC. There is no focal consolidation. There is no pleural effusion or pneumothorax. The heart and mediastinal contours are stable. There is no acute osseous abnormality. IMPRESSION: No active disease. Electronically Signed   By: Kathreen Devoid   On: 03/28/2019 06:52    ROS: As per HPI otherwise negative.  Physical Exam: Vitals:   03/28/19 0730 03/28/19 0800 03/28/19 0815 03/28/19 0944  BP: 94/78 107/72 117/70 111/87  Pulse: 85   86  Resp:  16 18 19 18   Temp:    98.9 F (37.2 C)  TempSrc:    Oral  SpO2: 99%   100%  Weight:      Height:         General: WDWN NAD Head: NCAT sclera not icteric MMM Neck: thick Lungs: CTA bilaterally without wheezes, rales, or rhonchi. Breathing is unlabored.(he took his O2 off) Heart: RRR with S1 S2.  Abdomen: soft distended obese + BS + anasarca with some dependent edema Lower extremities: left BKA well healed - some left medial thigh wound and diffuse RLE wounds (see admission H and P)  exquisitely painful. Neuro: A & O  X 3. Moves all extremities spontaneously. Intermittent myoclonic jerks noted Dialysis Access: right IJ TDC and left upper AVF maturing + bruit (placed 6/22) very medial - has IV below AVF in left arm  Dialysis Orders: [facility] MWF 4 hr EDW [phone]  left upper AVF maturing and right IJ TDC heaprin 4K venofer 100 x 2 more doses Micrea 100 q 2 wk last 8/.26 calcitriol 0.25  Recent labs: hgb 7.7 14% sat iPTH 258 Ca/P ok  Assessment/Plan: 1. RLE wounds - prior path from punch bx 8/5 showed necrotic tissue with acute inflammation-seen by ortho - needs plastics consult/abtx per primary BC ordered. 2. ESRD -  MWF --missed last HD - plan HD today and tomorrow back on schedule- continue to titrate EDW down  while here - AVF maturing - have discussed placement of IV with student RN who will notify primary RN for removal- also has right IV 3. Hypertension/volume  - still has some anasarca/ titrate volume down - prone to high IDWG - no BP meds only lasix 80 mg per day 4. Anemia  - hgb 7.1 declining -d/c on 100 Mircera - last dose 8/26  - tsat low  14% - repleting due two more doses- hold for now will ^ Aranesp to 150 - suspect some ESA resistance due to inflammation 5. Metabolic bone disease -  Continue low dose calcitriol 0.25 - no calciphylaxis on bx and iPTH not high but wounds and degree of pain parallel this 6. Nutrition - renal carb mod diet/vits 7. Hx left BKA  8. DM - per primary   9.   Urinary retention issues - d/c on flomax and low dose urecholine - w/ instruction to f/u with urology after last d/c  Myriam Jacobson, PA-C [place] [phone] 03/28/2019, 11:05 AM

## 2019-03-28 NOTE — Consult Note (Addendum)
I examined the patient and discussed the case with Hilbert Odor, PA-C.  I agree with his assessment and recommendations.  At this juncture I do not see any need for urgent operative intervention.  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  This is very diffuse in nature but stable in appearance.  I do not see any obvious signs of infection.  There is likely some low-grade cellulitis at a minimum.  That being said I think if limb salvage is an option we would need the plastic surgeons to weigh in.  The eschars in general are protective of the immature tissue underneath.  At this time my recommendation would be to follow-up with their recommendations and then if he has a situation that is not salvageable I would recommend [place] consulting with Meridee Score next week when he returns.  Furthermore, I agree with the nutrition and wound care consults.  Also agree with elevating the heels and low pressure positioning to the posterior leg and ankles.  The bulk of his pain currently is neuropathic in nature due to peripheral neuropathy.  Nicholes Stairs, MD Orthopedic Surgery   Reason for Consult:RLE wounds Referring Physician: [name] is an 48 y.o. male.  HPI: Michael Riggs was admitted today with SOB. He has a complicated medical history including DM with complications and ESRD on HD. 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 He was recently admitted on 7/18 with anasarca. He notes that he began to develop lower extremity wounds about 2 weeks prior to that and they have continued to worsen. He c/o significant burning pain in the lower leg that is persistent. He denies fevers, chills, sweats, N/V.  Past Medical History:  Diagnosis Date  . Anemia   . Chronic kidney disease    Stage 5  . Depression   . Diabetes mellitus without complication ([place])   . Dyspnea   . GERD (gastroesophageal reflux disease)   . Hypertension      Past Surgical History:  Procedure Laterality Date  . AV FISTULA PLACEMENT Left 01/09/2019   Procedure: ARTERIOVENOUS (AV) FISTULA CREATION LEFT UPPER ARM;  Surgeon: [name], MD;  Location: [facility] OR;  Service: Vascular;  Laterality: Left;  . below the knee amputation Left   . EYE SURGERY    . IR FLUORO GUIDE CV LINE RIGHT  01/31/2019  . IR FLUORO GUIDE CV LINE RIGHT  02/03/2019  . IR US GUIDE VASC ACCESS RIGHT  01/31/2019  . IR US GUIDE VASC ACCESS RIGHT  02/03/2019    No family history on file.  Social History:  reports that he quit smoking about 2 months ago. His smoking use included cigarettes. He started smoking about 4 months ago. He has never used smokeless tobacco. He reports previous alcohol use. He reports previous drug use.  Allergies: No Known Allergies  Medications: I have reviewed the patient's current medications.  Results for orders placed or performed during the hospital encounter of 03/28/19 (from the past 48 hour(s))  Culture, blood (Routine x 2)     Status: None (Preliminary result)   Collection Time: 03/28/19  5:20 AM   Specimen: BLOOD LEFT ARM  Result Value Ref Range   Specimen Description BLOOD LEFT ARM    Special Requests      BOTTLES DRAWN AEROBIC AND ANAEROBIC Blood Culture adequate volume Performed at [facility] [address], [place], [place] [address]  Culture PENDING    Report Status PENDING   Culture, blood (Routine x 2)     Status: None (Preliminary result)   Collection Time: 03/28/19  5:30 AM   Specimen: BLOOD LEFT ARM  Result Value Ref Range   Specimen Description BLOOD LEFT ARM    Special Requests      BOTTLES DRAWN AEROBIC AND ANAEROBIC Blood Culture results may not be optimal due to an excessive volume of blood received in culture bottles Performed at [facility], [place] [address], [place], [place] [address]    Culture PENDING    Report Status PENDING   Comprehensive metabolic panel     Status: Abnormal   Collection  Time: 03/28/19  5:38 AM  Result Value Ref Range   Sodium 135 135 - 145 mmol/L   Potassium 4.1 3.5 - 5.1 mmol/L   Chloride 95 (L) 98 - 111 mmol/L   CO2 21 (L) 22 - 32 mmol/L   Glucose, Bld 78 70 - 99 mg/dL   BUN 74 (H) 6 - 20 mg/dL   Creatinine, Ser 9.93 (H) 0.61 - 1.24 mg/dL   Calcium 8.5 (L) 8.9 - 10.3 mg/dL   Total Protein 9.2 (H) 6.5 - 8.1 g/dL   Albumin 2.3 (L) 3.5 - 5.0 g/dL   AST 15 15 - 41 U/L   ALT 23 0 - 44 U/L   Alkaline Phosphatase 125 38 - 126 U/L   Total Bilirubin 1.0 0.3 - 1.2 mg/dL   GFR calc non Af Amer 6 (L) >60 mL/min   GFR calc Af Amer 6 (L) >60 mL/min   Anion gap 19 (H) 5 - 15    Comment: Performed at [facility], [place] [address] [place], [place] [address]  Lactic acid, plasma     Status: None   Collection Time: 03/28/19  5:38 AM  Result Value Ref Range   Lactic Acid, Venous 1.4 0.5 - 1.9 mmol/L    Comment: Performed at [facility] [address] [place], [place] [address]  CBC with Differential     Status: Abnormal   Collection Time: 03/28/19  5:38 AM  Result Value Ref Range   WBC 14.6 (H) 4.0 - 10.5 K/uL   RBC 2.60 (L) 4.22 - 5.81 MIL/uL   Hemoglobin 7.1 (L) 13.0 - 17.0 g/dL   HCT 23.8 (L) 39.0 - 52.0 %   MCV 91.5 80.0 - 100.0 fL   MCH 27.3 26.0 - 34.0 pg   MCHC 29.8 (L) 30.0 - 36.0 g/dL   RDW 15.3 11.5 - 15.5 %   Platelets 360 150 - 400 K/uL   nRBC 0.0 0.0 - 0.2 %   Neutrophils Relative % 68 %   Neutro Abs 10.1 (H) 1.7 - 7.7 K/uL   Lymphocytes Relative 16 %   Lymphs Abs 2.3 0.7 - 4.0 K/uL   Monocytes Relative 9 %   Monocytes Absolute 1.3 (H) 0.1 - 1.0 K/uL   Eosinophils Relative 5 %   Eosinophils Absolute 0.8 (H) 0.0 - 0.5 K/uL   Basophils Relative 1 %   Basophils Absolute 0.1 0.0 - 0.1 K/uL   Immature Granulocytes 1 %   Abs Immature Granulocytes 0.09 (H) 0.00 - 0.07 K/uL    Comment: Performed at [facility] [address] [place], [place] [address]  Protime-INR     Status: Abnormal   Collection Time: 03/28/19  5:38  AM  Result Value Ref Range   Prothrombin Time 16.2 (  H) 11.4 - 15.2 seconds   INR 1.3 (H) 0.8 - 1.2    Comment: (NOTE) INR goal varies based on device and disease states. Performed at [facility], [place] [address] [place], [place] [address]   APTT     Status: None   Collection Time: 03/28/19  5:38 AM  Result Value Ref Range   aPTT 33 24 - 36 seconds    Comment: Performed at [facility] [address], [place], [place] [address]  Vancomycin, random     Status: None   Collection Time: 03/28/19  5:38 AM  Result Value Ref Range   Vancomycin Rm 7     Comment:        Random Vancomycin therapeutic range is dependent on dosage and time of specimen collection. A peak range is 20.0-40.0 ug/mL A trough range is 5.0-15.0 ug/mL        Performed at [facility] [address] [place],  [address]   SARS Coronavirus 2 [facility] order, Performed in [facility] hospital lab) Nasopharyngeal Nasopharyngeal Swab     Status: None   Collection Time: 03/28/19  6:05 AM   Specimen: Nasopharyngeal Swab  Result Value Ref Range   SARS Coronavirus 2 NEGATIVE NEGATIVE    Comment: (NOTE) If result is NEGATIVE SARS-CoV-2 target nucleic acids are NOT DETECTED. The SARS-CoV-2 RNA is generally detectable in upper and lower  respiratory specimens during the acute phase of infection. The lowest  concentration of SARS-CoV-2 viral copies this assay can detect is 250  copies / mL. A negative result does not preclude SARS-CoV-2 infection  and should not be used as the sole basis for treatment or other  patient management decisions.  A negative result may occur with  improper specimen collection / handling, submission of specimen other  than nasopharyngeal swab, presence of viral mutation(s) within the  areas targeted by this assay, and inadequate number of viral copies  (<250 copies / mL). A negative result must be combined with clinical  observations, patient history, and  epidemiological information. If result is POSITIVE SARS-CoV-2 target nucleic acids are DETECTED. The SARS-CoV-2 RNA is generally detectable in upper and lower  respiratory specimens dur ing the acute phase of infection.  Positive  results are indicative of active infection with SARS-CoV-2.  Clinical  correlation with patient history and other diagnostic information is  necessary to determine patient infection status.  Positive results do  not rule out bacterial infection or co-infection with other viruses. If result is PRESUMPTIVE POSTIVE SARS-CoV-2 nucleic acids MAY BE PRESENT.   A presumptive positive result was obtained on the submitted specimen  and confirmed on repeat testing.  While 2019 novel coronavirus  (SARS-CoV-2) nucleic acids may be present in the submitted sample  additional confirmatory testing may be necessary for epidemiological  and / or clinical management purposes  to differentiate between  SARS-CoV-2 and other Sarbecovirus currently known to infect humans.  If clinically indicated additional testing with an alternate test  methodology [phone]) is advised. The SARS-CoV-2 RNA is generally  detectable in upper and lower respiratory sp ecimens during the acute  phase of infection. The expected result is Negative. Fact Sheet for Patients:  StrictlyIdeas.no Fact Sheet for Healthcare Providers: BankingDealers.co.za This test is not yet approved or cleared by the [place] FDA and has been authorized for detection and/or diagnosis of SARS-CoV-2 by FDA under an Emergency Use Authorization (EUA).  This EUA will remain in effect (meaning this  test can be used) for the duration of the COVID-19 declaration under Section 564(b)(1) of the Act, 21 U.S.C. section 360bbb-3(b)(1), unless the authorization is terminated or revoked sooner. Performed at [facility], [place] [address], [place], [place] [address]   Type and  screen     Status: None   Collection Time: 03/28/19  6:20 AM  Result Value Ref Range   ABO/RH(D) O POS    Antibody Screen NEG    Sample Expiration      03/31/2019,2359 Performed at [facility], [place] [address] [place], [place] [address]   Lactic acid, plasma     Status: Abnormal   Collection Time: 03/28/19  9:33 AM  Result Value Ref Range   Lactic Acid, Venous 2.2 (HH) 0.5 - 1.9 mmol/L    Comment: CRITICAL RESULT CALLED TO, READ BACK BY AND VERIFIED WITH: L CROVIER RN [phone] FD:[id number] BY A BENNETT Performed at [facility], [place] [address], [place], [place] [address]     Dg Tibia/fibula Right  Result Date: 03/28/2019 CLINICAL DATA:  Right leg pain and burning sensation EXAM: RIGHT TIBIA AND FIBULA - 2 VIEW COMPARISON:  None. FINDINGS: There is no evidence of fracture or other focal bone lesions. Soft tissues are unremarkable. No soft tissue emphysema. Peripheral vascular atherosclerotic disease. IMPRESSION: No acute osseous abnormality of the right tibia and fibula. Electronically Signed   By: Kathreen Devoid   On: 03/28/2019 06:54   Dg Chest Port 1 View  Result Date: 03/28/2019 CLINICAL DATA:  Acute dyspnea, malaise, cough, chest discomfort EXAM: PORTABLE CHEST 1 VIEW COMPARISON:  03/04/2019 FINDINGS: There is a dual lumen right-sided central venous catheter with the tip projecting over the SVC. There is no focal consolidation. There is no pleural effusion or pneumothorax. The heart and mediastinal contours are stable. There is no acute osseous abnormality. IMPRESSION: No active disease. Electronically Signed   By: Kathreen Devoid   On: 03/28/2019 06:52    Review of Systems  Constitutional: Negative for chills, fever and weight loss.  HENT: Negative for ear discharge, ear pain, hearing loss and tinnitus.   Eyes: Negative for blurred vision, double vision, photophobia and pain.  Respiratory: Negative for cough, sputum production and shortness of breath.   Cardiovascular: Negative  for chest pain.  Gastrointestinal: Negative for abdominal pain, nausea and vomiting.  Genitourinary: Negative for dysuria, flank pain, frequency and urgency.  Musculoskeletal: Positive for myalgias (BLE). Negative for back pain, falls, joint pain and neck pain.  Neurological: Negative for dizziness, tingling, sensory change, focal weakness, loss of consciousness and headaches.  Endo/Heme/Allergies: Does not bruise/bleed easily.  Psychiatric/Behavioral: Negative for depression, memory loss and substance abuse. The patient is not nervous/anxious.    Blood pressure 111/87, pulse 86, temperature 98.9 F (37.2 C), temperature source Oral, resp. rate 18, height 5\' 10"  (1.778 m), weight 120.2 kg, SpO2 100 %. Physical Exam  Constitutional: He appears well-developed and well-nourished. No distress.  HENT:  Head: Normocephalic and atraumatic.  Eyes: Conjunctivae are normal. Right eye exhibits no discharge. Left eye exhibits no discharge. No scleral icterus.  Neck: Normal range of motion.  Cardiovascular: Normal rate and regular rhythm.  Respiratory: Effort normal. No respiratory distress.  Musculoskeletal:     Comments: RLE No traumatic wounds or rash  Lower leg circumferential ulcerations, many necrotic with malodor, mod TTP proximally, scattered discrete petechial lesions noted medial thigh  No knee or ankle effusion  Knee stable to varus/ valgus and anterior/posterior stress  Sens DPN, SPN absent, TN intact  Motor EHL, ext, flex, evers 5/5  DP 2+, PT 1+, No significant edema  LLE No traumatic wounds or rash  S/p BKA, petechial lesions noted thigh  No knee effusion  Neurological: He is alert.  Skin: Skin is warm and dry. He is not diaphoretic.  Psychiatric: He has a normal mood and affect. His behavior is normal.    Assessment/Plan: RLE wounds -- I think at minimum this will need surgical debridement with plastics involvement for skin grafts. This would need to be done in multiple staged  procedures. Given his poorly controlled DM and ESRD there would be additional issues with healing STSG sites. It may be that his best option at this point is another BKA. Dr. Stann Mainland to evaluate later today or tomorrow. Multiple medical problems including DM, ESRD on HD, and obesity -- per primary service    Lisette Abu, PA-C Orthopedic Surgery [phone] 03/28/2019, 10:43 AM

## 2019-03-28 NOTE — ED Notes (Signed)
Pt cleansed of stool pulled up in bed and placed back on O2

## 2019-03-29 LAB — BASIC METABOLIC PANEL
Anion gap: 12 (ref 5–15)
BUN: 37 mg/dL — ABNORMAL HIGH (ref 6–20)
CO2: 23 mmol/L (ref 22–32)
Calcium: 7.9 mg/dL — ABNORMAL LOW (ref 8.9–10.3)
Chloride: 99 mmol/L (ref 98–111)
Creatinine, Ser: 6.03 mg/dL — ABNORMAL HIGH (ref 0.61–1.24)
GFR calc Af Amer: 12 mL/min — ABNORMAL LOW (ref >60)
GFR calc non Af Amer: 10 mL/min — ABNORMAL LOW (ref >60)
Glucose, Bld: 112 mg/dL — ABNORMAL HIGH (ref 70–99)
Potassium: 3.9 mmol/L (ref 3.5–5.1)
Sodium: 134 mmol/L — ABNORMAL LOW (ref 135–145)

## 2019-03-29 LAB — CBC
HCT: 22 % — ABNORMAL LOW (ref 39.0–52.0)
Hemoglobin: 6.6 g/dL — CL (ref 13.0–17.0)
MCH: 27.5 pg (ref 26.0–34.0)
MCHC: 30 g/dL (ref 30.0–36.0)
MCV: 91.7 fL (ref 80.0–100.0)
Platelets: 320 10*3/uL (ref 150–400)
RBC: 2.4 MIL/uL — ABNORMAL LOW (ref 4.22–5.81)
RDW: 15.5 % (ref 11.5–15.5)
WBC: 14.5 10*3/uL — ABNORMAL HIGH (ref 4.0–10.5)
nRBC: 0 % (ref 0.0–0.2)

## 2019-03-29 LAB — PREPARE RBC (CROSSMATCH)

## 2019-03-29 LAB — LACTIC ACID, PLASMA: Lactic Acid, Venous: 1 mmol/L (ref 0.5–1.9)

## 2019-03-29 MED ORDER — ACETAMINOPHEN 325 MG PO TABS
ORAL_TABLET | ORAL | Status: AC
  Filled 2019-03-29: qty 2

## 2019-03-29 MED ORDER — CALCITRIOL 0.25 MCG PO CAPS
ORAL_CAPSULE | ORAL | Status: AC
  Filled 2019-03-29: qty 1

## 2019-03-29 MED ORDER — SODIUM CHLORIDE 0.9 % IV SOLN: 100.0000 mL | INTRAVENOUS | Status: DC | PRN

## 2019-03-29 MED ORDER — VANCOMYCIN HCL IN DEXTROSE 1-5 GM/200ML-% IV SOLN
INTRAVENOUS | Status: AC
  Filled 2019-03-29: qty 200

## 2019-03-29 MED ORDER — ALTEPLASE 2 MG IJ SOLR: 2.0000 mg | Freq: Once | INTRAMUSCULAR | Status: DC | PRN

## 2019-03-29 MED ORDER — LIDOCAINE HCL (PF) 1 % IJ SOLN: 5.0000 mL | INTRAMUSCULAR | Status: DC | PRN

## 2019-03-29 MED ORDER — HEPARIN SODIUM (PORCINE) 1000 UNIT/ML DIALYSIS: 20.0000 [IU]/kg | INTRAMUSCULAR | Status: DC | PRN

## 2019-03-29 MED ORDER — SODIUM CHLORIDE 0.9% IV SOLUTION: Freq: Once | INTRAVENOUS | Status: DC

## 2019-03-29 MED ORDER — PENTAFLUOROPROP-TETRAFLUOROETH EX AERO: 1.0000 "application " | INHALATION_SPRAY | CUTANEOUS | Status: DC | PRN

## 2019-03-29 MED ORDER — HEPARIN SODIUM (PORCINE) 1000 UNIT/ML DIALYSIS: 1000.0000 [IU] | INTRAMUSCULAR | Status: DC | PRN

## 2019-03-29 MED ORDER — LIDOCAINE-PRILOCAINE 2.5-2.5 % EX CREA: 1.0000 "application " | TOPICAL_CREAM | CUTANEOUS | Status: DC | PRN

## 2019-03-29 MED ORDER — HEPARIN SODIUM (PORCINE) 1000 UNIT/ML IJ SOLN
INTRAMUSCULAR | Status: AC
  Filled 2019-03-29: qty 4

## 2019-03-29 MED ORDER — OXYCODONE HCL 5 MG PO TABS
ORAL_TABLET | ORAL | Status: AC
  Filled 2019-03-29: qty 1

## 2019-03-29 MED ORDER — DARBEPOETIN ALFA 150 MCG/0.3ML IJ SOSY
PREFILLED_SYRINGE | INTRAMUSCULAR | Status: AC
  Filled 2019-03-29: qty 0.3

## 2019-03-29 NOTE — Progress Notes (Signed)
Temperature at initiation of blood 101.1 oral.  Andrew Au, PA on the unit and notified.  Orders received to give Tylenol 650mg  po.  Orders followed as given.

## 2019-03-29 NOTE — Progress Notes (Signed)
PROGRESS NOTE    Michael Riggs  [name] DOB: 12-13-1970 DOA: 03/28/2019 PCP: [name], MD   Brief Narrative:  Michael Riggs is a 48 y.o. male with medical history significant of HTN; HLD; and ESRD on TTS HD presented to ED with SOB starting the night before presentation,  +cough.  +fever.  R leg infection.  Essentially, he was admitted from 7/13-17 for progressive renal dysfunction, new ESRD with uremia-induced metabolic encephalopathy.  He had TDC placed on 7/14 and started on HD.  He was discharged to home on 7/17 and returned to the ER and was admitted from 7/18-22 for anasarca and volume overload and was discharged to CIR on 7/22 because of generalized deconditioning with ambulatory dysfunction.  He was noted to have multiple skin tears and ulceration of the RLE and received wound care while inpatient.  He was admitted to CIR from 7/22-8/25 due to decreased mobility.  At the time of d/c, he had limited assistance at home and so the decision was made to discharge to SNF.  He went to [facility] on 8/25. His wounds appeared to be healing reasonably well at that time.  He was sent from his skilled nursing facility for fever and confusion.  Upon arrival to the ED, he was febrile, and hypoxic with oxygen saturation of 85% with EMS requiring 2 to 3 L of oxygen. Also worsening leg pain - has drainage with necrotic tissue, no apparent cellulitis and no gas on xray.  No apparent deep space infection.  COVID negative.  Patient was admitted secondary to sepsis likely secondary to right lower extremity wound infection/cellulitis.  Initial lactic acid was normal but repeat was 2.2.  Chest x-ray was normal.  Patient was started on Rocephin, Flagyl and vancomycin.  He was evaluated by orthopedics in the ED who had said that patient does not seem to need emergent surgical intervention and they had recommended and eventually consulted plastic surgeon to be on board.  Assessment &  Plan:   Principal Problem:   Wound infection Active Problems:   Hypertension   ESRD (end stage renal disease) on dialysis (HCC)   Dyslipidemia   Diabetes mellitus with peripheral vascular disease ([place])   Depression   Physical deconditioning  Sepsis likely secondary to right lower extremity wound infection/cellulitis: He has remained afebrile since having fever in the emergency department.  Feels much better.  Leukocytosis stable.  Lactic acidosis resolved.  Blood culture negative.  Continue cefepime, Flagyl and vancomycin per pharmacy and follow blood culture and tailor antibiotics accordingly.  Await plastic surgery evaluation and further recommendations.  I spoke to Hilbert Odor, PA from orthopedics and according to him, patient can be discharged anytime if medically cleared.  He also mentioned that Dr. Sharol Given will be the one who usually takes care of cases like this and he is on vacation this week and will return on Monday the earliest to see the patient.  ESRD: On MWF HD schedule.  Nephrology consulted.  Management per them.  Essential hypertension: Controlled.  Continue Norvasc.  Hyperlipidemia: Continue Lipitor.  Recent syphilis: Treated with penicillin at health department in early July.  DVT prophylaxis: Heparin Code Status: Full code Family Communication:  None present at bedside.  Plan of care discussed with patient in length and he verbalized understanding and agreed with it. Disposition Plan: Back to SNF when medically stable and cleared by consultants.  Consultants:   Orthopedics  Plastic surgery  Procedures:   None  Antimicrobials:  IV cefepime 03/28/2019>  IV Flagyl 03/28/2019>  IV vancomycin 03/28/2019>   Subjective: Patient seen and examined in dialysis unit.  Although he is alert and oriented but he asked me " I do not understand why I am in the hospital, I just asked for pain medication from the nurse and I have excruciating right lower extremity pain".    Objective: Vitals:   03/29/19 1000 03/29/19 1030 03/29/19 1100 03/29/19 1130  BP: 133/90 106/64 124/68 114/71  Pulse: 63 93 86 81  Resp:      Temp:      TempSrc:      SpO2:      Weight:      Height:        Intake/Output Summary (Last 24 hours) at 03/29/2019 1146 Last data filed at 03/29/2019 0940 Gross per 24 hour  Intake 1055 ml  Output 4300 ml  Net -3245 ml   Filed Weights   03/28/19 1215 03/28/19 1700 03/29/19 0745  Weight: 123.1 kg 118.8 kg 119.6 kg    Examination:  General exam: Appears calm and comfortable, morbidly obese Respiratory system: Clear to auscultation. Respiratory effort normal. Cardiovascular system: S1 & S2 heard, RRR. No JVD, murmurs, rubs, gallops or clicks. No pedal edema. Gastrointestinal system: Abdomen is nondistended, soft and nontender. No organomegaly or masses felt. Normal bowel sounds heard. Central nervous system: Alert and oriented. No focal neurological deficits. Extremities: Status post left BKA with well-healing stump. Skin: Right lower extremity has diffuse Isharani of most of the right leg with some drainage.  No more foul-smelling.  Also has black eschar on the medial thigh of the left leg.  This is tender to palpation. Psychiatry: Judgement and insight appear normal. Mood & affect appropriate.    Data Reviewed: I have personally reviewed following labs and imaging studies  CBC: Recent Labs  Lab 03/28/19 0538 03/29/19 0459  WBC 14.6* 14.5*  NEUTROABS 10.1*  --   HGB 7.1* 6.6*  HCT 23.8* 22.0*  MCV 91.5 91.7  PLT 360 [id number]   Basic Metabolic Panel: Recent Labs  Lab 03/28/19 0538 03/29/19 0459  NA 135 134*  K 4.1 3.9  CL 95* 99  CO2 21* 23  GLUCOSE 78 112*  BUN 74* 37*  CREATININE 9.93* 6.03*  CALCIUM 8.5* 7.9*   GFR: Estimated Creatinine Clearance: 19.6 mL/min (A) (by C-G formula based on SCr of 6.03 mg/dL (H)). Liver Function Tests: Recent Labs  Lab 03/28/19 0538  AST 15  ALT 23  ALKPHOS 125  BILITOT 1.0   PROT 9.2*  ALBUMIN 2.3*   No results for input(s): LIPASE, AMYLASE in the last 168 hours. No results for input(s): AMMONIA in the last 168 hours. Coagulation Profile: Recent Labs  Lab 03/28/19 0538  INR 1.3*   Cardiac Enzymes: No results for input(s): CKTOTAL, CKMB, CKMBINDEX, TROPONINI in the last 168 hours. BNP (last 3 results) No results for input(s): PROBNP in the last 8760 hours. HbA1C: No results for input(s): HGBA1C in the last 72 hours. CBG: Recent Labs  Lab 03/28/19 2238  GLUCAP 88   Lipid Profile: No results for input(s): CHOL, HDL, LDLCALC, TRIG, CHOLHDL, LDLDIRECT in the last 72 hours. Thyroid Function Tests: No results for input(s): TSH, T4TOTAL, FREET4, T3FREE, THYROIDAB in the last 72 hours. Anemia Panel: No results for input(s): VITAMINB12, FOLATE, FERRITIN, TIBC, IRON, RETICCTPCT in the last 72 hours. Sepsis Labs: Recent Labs  Lab 03/28/19 0538 03/28/19 0933 03/28/19 2028 03/28/19 2349  LATICACIDVEN 1.4 2.2* 0.9 1.0  Recent Results (from the past 240 hour(s))  Culture, blood (Routine x 2)     Status: None (Preliminary result)   Collection Time: 03/28/19  5:20 AM   Specimen: BLOOD LEFT ARM  Result Value Ref Range Status   Specimen Description BLOOD LEFT ARM  Final   Special Requests   Final    BOTTLES DRAWN AEROBIC AND ANAEROBIC Blood Culture adequate volume   Culture   Final    NO GROWTH 1 DAY Performed at [facility], [address] [place], [place] [address]    Report Status PENDING  Incomplete  Culture, blood (Routine x 2)     Status: None (Preliminary result)   Collection Time: 03/28/19  5:30 AM   Specimen: BLOOD LEFT ARM  Result Value Ref Range Status   Specimen Description BLOOD LEFT ARM  Final   Special Requests   Final    BOTTLES DRAWN AEROBIC AND ANAEROBIC Blood Culture results may not be optimal due to an excessive volume of blood received in culture bottles   Culture   Final    NO GROWTH 1 DAY Performed at [facility], [place] [address], [place], [place] [address]    Report Status PENDING  Incomplete  SARS Coronavirus 2 [facility] order, Performed in [facility] hospital lab) Nasopharyngeal Nasopharyngeal Swab     Status: None   Collection Time: 03/28/19  6:05 AM   Specimen: Nasopharyngeal Swab  Result Value Ref Range Status   SARS Coronavirus 2 NEGATIVE NEGATIVE Final    Comment: (NOTE) If result is NEGATIVE SARS-CoV-2 target nucleic acids are NOT DETECTED. The SARS-CoV-2 RNA is generally detectable in upper and lower  respiratory specimens during the acute phase of infection. The lowest  concentration of SARS-CoV-2 viral copies this assay can detect is 250  copies / mL. A negative result does not preclude SARS-CoV-2 infection  and should not be used as the sole basis for treatment or other  patient management decisions.  A negative result may occur with  improper specimen collection / handling, submission of specimen other  than nasopharyngeal swab, presence of viral mutation(s) within the  areas targeted by this assay, and inadequate number of viral copies  (<250 copies / mL). A negative result must be combined with clinical  observations, patient history, and epidemiological information. If result is POSITIVE SARS-CoV-2 target nucleic acids are DETECTED. The SARS-CoV-2 RNA is generally detectable in upper and lower  respiratory specimens dur ing the acute phase of infection.  Positive  results are indicative of active infection with SARS-CoV-2.  Clinical  correlation with patient history and other diagnostic information is  necessary to determine patient infection status.  Positive results do  not rule out bacterial infection or co-infection with other viruses. If result is PRESUMPTIVE POSTIVE SARS-CoV-2 nucleic acids MAY BE PRESENT.   A presumptive positive result was obtained on the submitted specimen  and confirmed on repeat testing.  While 2019 novel coronavirus  (SARS-CoV-2) nucleic  acids may be present in the submitted sample  additional confirmatory testing may be necessary for epidemiological  and / or clinical management purposes  to differentiate between  SARS-CoV-2 and other Sarbecovirus currently known to infect humans.  If clinically indicated additional testing with an alternate test  methodology [phone]) is advised. The SARS-CoV-2 RNA is generally  detectable in upper and lower respiratory sp ecimens during the acute  phase of infection. The expected result is Negative. Fact Sheet for Patients:  StrictlyIdeas.no Fact Sheet  for Healthcare Providers: BankingDealers.co.za This test is not yet approved or cleared by the [place] and has been authorized for detection and/or diagnosis of SARS-CoV-2 by FDA under an Emergency Use Authorization (EUA).  This EUA will remain in effect (meaning this test can be used) for the duration of the COVID-19 declaration under Section 564(b)(1) of the Act, 21 U.S.C. section 360bbb-3(b)(1), unless the authorization is terminated or revoked sooner. Performed at [facility], [place] [address], [place], [place] [address]       Radiology Studies: Dg Tibia/fibula Right  Result Date: 03/28/2019 CLINICAL DATA:  Right leg pain and burning sensation EXAM: RIGHT TIBIA AND FIBULA - 2 VIEW COMPARISON:  None. FINDINGS: There is no evidence of fracture or other focal bone lesions. Soft tissues are unremarkable. No soft tissue emphysema. Peripheral vascular atherosclerotic disease. IMPRESSION: No acute osseous abnormality of the right tibia and fibula. Electronically Signed   By: Kathreen Devoid   On: 03/28/2019 06:54   Dg Chest Port 1 View  Result Date: 03/28/2019 CLINICAL DATA:  Acute dyspnea, malaise, cough, chest discomfort EXAM: PORTABLE CHEST 1 VIEW COMPARISON:  03/04/2019 FINDINGS: There is a dual lumen right-sided central venous catheter with the tip projecting over the SVC.  There is no focal consolidation. There is no pleural effusion or pneumothorax. The heart and mediastinal contours are stable. There is no acute osseous abnormality. IMPRESSION: No active disease. Electronically Signed   By: Kathreen Devoid   On: 03/28/2019 06:52    Scheduled Meds: . sodium chloride   Intravenous Once  . sodium chloride   Intravenous Once  . atorvastatin  40 mg Oral Q2000  . bethanechol  25 mg Oral TID  . calcitRIOL  0.25 mcg Oral Q M,W,F-1800  . Chlorhexidine Gluconate Cloth  6 each Topical Q0600  . citalopram  40 mg Oral Daily  . darbepoetin (ARANESP) injection - DIALYSIS  150 mcg Intravenous Q Wed-HD  . gabapentin  300 mg Oral QHS  . heparin  5,000 Units Subcutaneous Q8H  . Melatonin  4.5 mg Oral QHS  . multivitamin  1 tablet Oral QHS  . oxyCODONE  5 mg Oral TID  . pantoprazole  40 mg Oral Daily  . polyethylene glycol  17 g Oral Daily  . senna-docusate  1 tablet Oral BID  . tamsulosin  0.4 mg Oral QPC breakfast   Continuous Infusions: . sodium chloride    . sodium chloride    . sodium chloride    . sodium chloride    . ceFEPime (MAXIPIME) IV    . metronidazole 500 mg (03/29/19 0336)  . vancomycin    . vancomycin    . vancomycin 1,000 mg (03/29/19 1116)     LOS: 1 day   Time spent: 41 minutes  Darliss Cheney, MD [facility] Pager [phone]  If 7PM-7AM, please contact night-coverage [email or web address] Password [email or web address] 03/29/2019, 11:46 AM

## 2019-03-29 NOTE — Plan of Care (Signed)
  Problem: Education: Goal: Knowledge of General Education information will improve Description Including pain rating scale, medication(s)/side effects and non-pharmacologic comfort measures Outcome: Progressing   

## 2019-03-29 NOTE — Progress Notes (Signed)
During hemodialysis patient had a very large stool that soiled all bed linen.  Patient was cleaned and new bed linen applied.

## 2019-03-29 NOTE — Consult Note (Signed)
Orthopedics consulted, recommends plastic surgery evaluation and if limb salvage is not an option to have Dr. Sharol Given evaluate patient next week when he returns.  Much worse in apperance since last admission with new onset of necrotic tissue on the left inner thigh.    I do not believe topical wound care is the answer at this time will order conservative tx at this time.   Will sign off [place], [place], [place]

## 2019-03-29 NOTE — Progress Notes (Signed)
[facility] Progress Note   Dialysis Orders: [facility] MWF 4 hr EDW 120 499.800  left upper AVF maturing and right IJ TDC heaprin 4K venofer 100 x 2 more doses Micrea 100 q 2 wk last 8/.26 calcitriol 0.25  Recent labs: hgb 7.7 14% sat iPTH 258 Ca/P ok  Assessment/Plan: 1. RLE wounds - prior path from punch bx 8/5 showed necrotic tissue with acute inflammation-seen by ortho - needs plastics consult/abtx per primary BC ordered.- given hx of DM/prior BKA - surgical consult for BKA might be more appropriate ? wound healing issues  2. ESRD -  MWF --back on schedule today  3.9 - use 3 K bath 3. Hypertension/volume  -  prone to high IDWG - no BP meds only lasix 80 mg per day - net UF 4.3 Tuesday - back on schedule today - lowering volume 4. Anemia  - hgb 7.1on admission declining -d/c on 100 Mircera - last dose 8/26  - tsat low  14% - repleting due two more doses- hold for now will ^ Aranesp to 150 - suspect some ESA resistance due to inflammation; hgb to 6.6 this am - plan transfuse one unit PRBC - check FOBT -  5. Metabolic bone disease -  Continue low dose calcitriol 0.25 - no calciphylaxis on bx and iPTH not high but wounds and degree of pain parallel this 6. Nutrition - renal carb mod diet/vits 7. Hx left BKA  8. DM - per primary  9.   Urinary retention issues - d/c on flomax and low dose urecholine - w/ instruction to f/u with urology after last d/c  Myriam Jacobson, PA-C [facility] Beeper [phone] 03/29/2019,8:19 AM  LOS: 1 day   Subjective:   Coughing some but supine on HD - no SOB  Objective Vitals:   03/28/19 1812 03/28/19 2052 03/29/19 0354 03/29/19 0745  BP: 130/72 131/79 122/72   Pulse: 90 100 87   Resp:  18 16   Temp: 98.5 F (36.9 C) 98.4 F (36.9 C) 98.2 F (36.8 C)   TempSrc: Oral Oral Oral   SpO2: (!) 78% 98% 98% 97%  Weight:      Height:       Physical Exam General: obese male chronically ill supine on HD Heart: RRR Lungs: clear  anteriorly  Abdomen: obese with anasarca/dependent flank edema Extremities: right LE wounds wrapped  Left BKA no edema Dialysis Access:  Right IJ TDC left upper AVF + bruit   Additional Objective Labs: Basic Metabolic Panel: Recent Labs  Lab 03/28/19 0538 03/29/19 0459  NA 135 134*  K 4.1 3.9  CL 95* 99  CO2 21* 23  GLUCOSE 78 112*  BUN 74* 37*  CREATININE 9.93* 6.03*  CALCIUM 8.5* 7.9*   Liver Function Tests: Recent Labs  Lab 03/28/19 0538  AST 15  ALT 23  ALKPHOS 125  BILITOT 1.0  PROT 9.2*  ALBUMIN 2.3*   No results for input(s): LIPASE, AMYLASE in the last 168 hours. CBC: Recent Labs  Lab 03/28/19 0538 03/29/19 0459  WBC 14.6* 14.5*  NEUTROABS 10.1*  --   HGB 7.1* 6.6*  HCT 23.8* 22.0*  MCV 91.5 91.7  PLT 360 320   Blood Culture    Component Value Date/Time   SDES BLOOD LEFT ARM 03/28/2019 0530   SPECREQUEST  03/28/2019 0530    BOTTLES DRAWN AEROBIC AND ANAEROBIC Blood Culture results may not be optimal due to an excessive volume of blood received in culture bottles  CULT  03/28/2019 0530    NO GROWTH 1 DAY Performed at [facility] [address], [place], [place] [address]    REPTSTATUS PENDING 03/28/2019 0530    Cardiac Enzymes: No results for input(s): CKTOTAL, CKMB, CKMBINDEX, TROPONINI in the last 168 hours. CBG: Recent Labs  Lab 03/28/19 2238  GLUCAP 88   Iron Studies: No results for input(s): IRON, TIBC, TRANSFERRIN, FERRITIN in the last 72 hours. Lab Results  Component Value Date   INR 1.3 (H) 03/28/2019   INR 1.4 (H) 01/31/2019   Studies/Results: Dg Tibia/fibula Right  Result Date: 03/28/2019 CLINICAL DATA:  Right leg pain and burning sensation EXAM: RIGHT TIBIA AND FIBULA - 2 VIEW COMPARISON:  None. FINDINGS: There is no evidence of fracture or other focal bone lesions. Soft tissues are unremarkable. No soft tissue emphysema. Peripheral vascular atherosclerotic disease. IMPRESSION: No acute osseous abnormality of the  right tibia and fibula. Electronically Signed   By: Kathreen Devoid   On: 03/28/2019 06:54   Dg Chest Port 1 View  Result Date: 03/28/2019 CLINICAL DATA:  Acute dyspnea, malaise, cough, chest discomfort EXAM: PORTABLE CHEST 1 VIEW COMPARISON:  03/04/2019 FINDINGS: There is a dual lumen right-sided central venous catheter with the tip projecting over the SVC. There is no focal consolidation. There is no pleural effusion or pneumothorax. The heart and mediastinal contours are stable. There is no acute osseous abnormality. IMPRESSION: No active disease. Electronically Signed   By: Kathreen Devoid   On: 03/28/2019 06:52   Medications: . ceFEPime (MAXIPIME) IV    . metronidazole 500 mg (03/29/19 0336)  . vancomycin     . sodium chloride   Intravenous Once  . sodium chloride   Intravenous Once  . atorvastatin  40 mg Oral Q2000  . bethanechol  25 mg Oral TID  . calcitRIOL  0.25 mcg Oral Q M,W,F-1800  . Chlorhexidine Gluconate Cloth  6 each Topical Q0600  . citalopram  40 mg Oral Daily  . darbepoetin (ARANESP) injection - DIALYSIS  150 mcg Intravenous Q Wed-HD  . gabapentin  300 mg Oral QHS  . heparin  5,000 Units Subcutaneous Q8H  . Melatonin  4.5 mg Oral QHS  . multivitamin  1 tablet Oral QHS  . oxyCODONE  5 mg Oral TID  . pantoprazole  40 mg Oral Daily  . polyethylene glycol  17 g Oral Daily  . senna-docusate  1 tablet Oral BID  . tamsulosin  0.4 mg Oral QPC breakfast

## 2019-03-29 NOTE — Procedures (Signed)
Patient seen on Hemodialysis. BP 132/72   Pulse 89   Temp 97.7 F (36.5 C) (Oral)   Resp 16   Ht 5\' 10"  (1.778 m)   Wt 119.6 kg   SpO2 97%   BMI 37.83 kg/m   QB 400, UF goal 4.85L Tolerating treatment without complaints at this time.   Elmarie Shiley MD [facility]. Office # [phone] Pager # [phone] 10:03 AM

## 2019-03-30 LAB — COMPREHENSIVE METABOLIC PANEL
ALT: 15 U/L (ref 0–44)
AST: 16 U/L (ref 15–41)
Albumin: 2 g/dL — ABNORMAL LOW (ref 3.5–5.0)
Alkaline Phosphatase: 91 U/L (ref 38–126)
Anion gap: 10 (ref 5–15)
BUN: 24 mg/dL — ABNORMAL HIGH (ref 6–20)
CO2: 26 mmol/L (ref 22–32)
Calcium: 8.4 mg/dL — ABNORMAL LOW (ref 8.9–10.3)
Chloride: 98 mmol/L (ref 98–111)
Creatinine, Ser: 4.97 mg/dL — ABNORMAL HIGH (ref 0.61–1.24)
GFR calc Af Amer: 15 mL/min — ABNORMAL LOW (ref >60)
GFR calc non Af Amer: 13 mL/min — ABNORMAL LOW (ref >60)
Glucose, Bld: 86 mg/dL (ref 70–99)
Potassium: 3.4 mmol/L — ABNORMAL LOW (ref 3.5–5.1)
Sodium: 134 mmol/L — ABNORMAL LOW (ref 135–145)
Total Bilirubin: 1 mg/dL (ref 0.3–1.2)
Total Protein: 8.1 g/dL (ref 6.5–8.1)

## 2019-03-30 LAB — TYPE AND SCREEN
ABO/RH(D): O POS
Antibody Screen: NEGATIVE
Unit division: 0

## 2019-03-30 LAB — BPAM RBC
Blood Product Expiration Date: 202010132359
ISSUE DATE / TIME: 202009090849
Unit Type and Rh: 5100

## 2019-03-30 MED ORDER — SODIUM CHLORIDE 0.9 % IV SOLN
125.0000 mg | INTRAVENOUS | Status: DC
  Filled 2019-03-30 (×2): qty 10

## 2019-03-30 MED ORDER — CHLORHEXIDINE GLUCONATE CLOTH 2 % EX PADS
6.0000 | MEDICATED_PAD | Freq: Every day | CUTANEOUS | Status: DC
  Administered 2019-03-30 – 2019-04-02 (×4): 6 via TOPICAL

## 2019-03-30 MED ORDER — PRO-STAT SUGAR FREE PO LIQD
30.0000 mL | Freq: Two times a day (BID) | ORAL | Status: DC
  Administered 2019-03-30 – 2019-04-10 (×22): 30 mL via ORAL
  Filled 2019-03-30 (×23): qty 30

## 2019-03-30 NOTE — Progress Notes (Signed)
MD notified that patient has had three episodes of diarrhea. MD notified. MD stated to hold all medications that cause diarrhea. Orders followed. Will continue to monitor.

## 2019-03-30 NOTE — Consult Note (Signed)
   [facility] CM Inpatient Consult   03/30/2019  [name] September 11, 1970 [id number]    [facility] needed with a39%extreme high risk score forunplanned readmission, and has 4 hospitalizations and 1 ED visit in the past 6 months, under his[employer] Health[employer] Medicare plan.  Patient had been outreached by Sandy tech for medication adherence.  Chart review andMD history and physical on9/8/20 revealas follows: Michael Riggs is a 48 y.o. male with medical history significant of HTN; HLD; and ESRD on TTS HD presenting with SOB, R leg infection. He was admitted from 7/13-17 for progressive renal dysfunction, new ESRD with uremia-induced metabolic encephalopathy.  He had TDC placed on 7/14 and started on HD.  He was discharged to home on 7/17 and returned to the ER and was admitted from 7/18-22 for anasarca and volume overload and was discharged to CIR on 7/22 because of generalized deconditioning with ambulatory dysfunction.  He was noted to have multiple skin tears and ulceration of the RLE and received wound care while inpatient.  He was admitted to CIR from 7/22-8/25 due to decreased mobility.  At the time of d/c, he had limited assistance at home and so the decision was made to discharge to SNF. He went to [facility] on 8/25. His wounds appeared to be healing reasonably well at that time.   [Sepsis likely secondary to right lower extremity wound infection/ cellulitis]   Primary care providerisDr. [name] with [facility], listed as providing transition of care.  Per MD note, disposition plan is to go back to SNF (skilled nursing facility) when medically stable and cleared by consultants.  Per medical record review, [place] is from [place].   If there are any changes in disposition or needs, please refer to [facility] care management for follow-up as  appropriate.   For questions and referral, please call:  Edwena Felty A. Starr Urias, BSN, RN-BC [facility] Liaison Cell: [phone]

## 2019-03-30 NOTE — Progress Notes (Addendum)
[facility] Progress Note   Dialysis Orders: [facility] MWF 4 hr EDW 120 400/800  left upper AVF maturing and right IJ TDC heaprin 4K venofer 100 x 2 more doses Micrea 100 q 2 wk last 8/.26 calcitriol 0.25  Recent labs: hgb 7.7 14% sat iPTH 258 Ca/P ok  Assessment/Plan: 1. RLE wounds - prior path from punch bx 8/5 showed necrotic tissue with acute inflammation-seen by ortho - needs plastics consult/abtx per primary.- given hx of prior BKA - surgical consult for BKA might be more appropriate ? wound healing issues BC pending - low grade temp 2. ESRD -  MWF -next HD Friday 3. Hypertension/volume  -  prone to high IDWG/ with anasarca - no BP meds only lasix 80 mg per day - net UF 4.3 Tuesday  And 4 L Wed - continue to lower volume serially - re-establish EDW  4. Anemia  - hgb 7.1 on admission declining -d/c on 100 Mircera - last dose 8/26  - tsat low  14% - repleting - will resume IV Fe and complete doses as he is not overtly septicl ^ Aranesp to 150 - suspect some ESA resistance due to inflammation; hgb to 6.6 9/9  -  transfused one unit PRBC - check FOBT - no labs today yet 5. Metabolic bone disease -  Continue low dose calcitriol 0.25 - no calciphylaxis on bx and iPTH not high but wounds and degree of pain parallel this 6. Nutrition - renal diet/vits/restrict fluids alb low - add prostat 7. Hx left BKA    8.   Urinary retention issues - d/c on flomax and low dose urecholine - w/ instruction to f/u with urology after last d/c  Myriam Jacobson, PA-C [place] [phone] 03/30/2019,10:04 AM  LOS: 2 days   Subjective:   Denies SOB or any leg pain  Objective Vitals:   03/29/19 1816 03/29/19 2117 03/30/19 0429 03/30/19 0917  BP: 123/70 130/66 118/68 116/68  Pulse: 88 88 81 85  Resp: 18 16 20 18   Temp: 100.3 F (37.9 C) 99.2 F (37.3 C) 99.1 F (37.3 C) 98.7 F (37.1 C)  TempSrc: Oral Oral Oral Oral  SpO2: 93% 95% 93% 94%  Weight:  115.9 kg    Height:        Physical Exam General: sleeping supine in room no O2 rouses easily - breathing easily Heart: RRR Lungs: clear anteriorly  Abdomen: obese with anasarca/dependent flank edema improving + BS Extremities: right LE wounds wrapped  Left BKA no edema some dependent bilateral upper thigh edema Dialysis Access:  Right IJ TDC left upper AVF + bruit   Additional Objective Labs: Basic Metabolic Panel: Recent Labs  Lab 03/28/19 0538 03/29/19 0459 03/30/19 0824  NA 135 134* 134*  K 4.1 3.9 3.4*  CL 95* 99 98  CO2 21* 23 26  GLUCOSE 78 112* 86  BUN 74* 37* 24*  CREATININE 9.93* 6.03* 4.97*  CALCIUM 8.5* 7.9* 8.4*   Liver Function Tests: Recent Labs  Lab 03/28/19 0538 03/30/19 0824  AST 15 16  ALT 23 15  ALKPHOS 125 91  BILITOT 1.0 1.0  PROT 9.2* 8.1  ALBUMIN 2.3* 2.0*   No results for input(s): LIPASE, AMYLASE in the last 168 hours. CBC: Recent Labs  Lab 03/28/19 0538 03/29/19 0459  WBC 14.6* 14.5*  NEUTROABS 10.1*  --   HGB 7.1* 6.6*  HCT 23.8* 22.0*  MCV 91.5 91.7  PLT 360 320   Blood Culture  Component Value Date/Time   SDES BLOOD LEFT ARM 03/28/2019 0530   SPECREQUEST  03/28/2019 0530    BOTTLES DRAWN AEROBIC AND ANAEROBIC Blood Culture results may not be optimal due to an excessive volume of blood received in culture bottles   CULT  03/28/2019 0530    NO GROWTH 1 DAY Performed at [facility], [place] [address] [place], [place] [address]    REPTSTATUS PENDING 03/28/2019 0530    Cardiac Enzymes: No results for input(s): CKTOTAL, CKMB, CKMBINDEX, TROPONINI in the last 168 hours. CBG: Recent Labs  Lab 03/28/19 2238  GLUCAP 88   Iron Studies: No results for input(s): IRON, TIBC, TRANSFERRIN, FERRITIN in the last 72 hours. Lab Results  Component Value Date   INR 1.3 (H) 03/28/2019   INR 1.4 (H) 01/31/2019   Studies/Results: No results found. Medications: . ceFEPime (MAXIPIME) IV 2 g (03/29/19 1648)  . metronidazole 500 mg (03/30/19 0535)   . vancomycin 1,000 mg (03/29/19 1116)   . sodium chloride   Intravenous Once  . sodium chloride   Intravenous Once  . atorvastatin  40 mg Oral Q2000  . bethanechol  25 mg Oral TID  . calcitRIOL  0.25 mcg Oral Q M,W,F-1800  . Chlorhexidine Gluconate Cloth  6 each Topical Q0600  . citalopram  40 mg Oral Daily  . darbepoetin (ARANESP) injection - DIALYSIS  150 mcg Intravenous Q Wed-HD  . gabapentin  300 mg Oral QHS  . heparin  5,000 Units Subcutaneous Q8H  . Melatonin  4.5 mg Oral QHS  . multivitamin  1 tablet Oral QHS  . oxyCODONE  5 mg Oral TID  . pantoprazole  40 mg Oral Daily  . polyethylene glycol  17 g Oral Daily  . senna-docusate  1 tablet Oral BID  . tamsulosin  0.4 mg Oral QPC breakfast

## 2019-03-30 NOTE — Progress Notes (Signed)
PROGRESS NOTE    Michael Riggs  [name] DOB: 1971/06/12 DOA: 03/28/2019 PCP: [name], MD   Brief Narrative:  Michael Riggs is a 48 y.o. male with medical history significant of HTN; HLD; and ESRD on TTS HD presented to ED with SOB starting the night before presentation,  +cough.  +fever.  R leg infection.  Essentially, he was admitted from 7/13-17 for progressive renal dysfunction, new ESRD with uremia-induced metabolic encephalopathy.  He had TDC placed on 7/14 and started on HD.  He was discharged to home on 7/17 and returned to the ER and was admitted from 7/18-22 for anasarca and volume overload and was discharged to CIR on 7/22 because of generalized deconditioning with ambulatory dysfunction.  He was noted to have multiple skin tears and ulceration of the RLE and received wound care while inpatient.  He was admitted to CIR from 7/22-8/25 due to decreased mobility.  At the time of d/c, he had limited assistance at home and so the decision was made to discharge to SNF.  He went to [facility] on 8/25. His wounds appeared to be healing reasonably well at that time.  He was sent from his skilled nursing facility for fever and confusion.  Upon arrival to the ED, he was febrile, and hypoxic with oxygen saturation of 85% with EMS requiring 2 to 3 L of oxygen. Also worsening leg pain - has drainage with necrotic tissue, no apparent cellulitis and no gas on xray.  No apparent deep space infection.  COVID negative.  Patient was admitted secondary to sepsis likely secondary to right lower extremity wound infection/cellulitis.  Initial lactic acid was normal but repeat was 2.2.  Chest x-ray was normal.  Patient was started on Rocephin, Flagyl and vancomycin.  He was evaluated by orthopedics in the ED who had said that patient does not seem to need emergent surgical intervention and consulted plastic surgeon to be on board.  Assessment & Plan:   Principal Problem:   Wound  infection Active Problems:   Hypertension   ESRD (end stage renal disease) on dialysis (HCC)   Dyslipidemia   Diabetes mellitus with peripheral vascular disease ([place])   Depression   Physical deconditioning  Sepsis likely secondary to right lower extremity wound infection/cellulitis: He was febrile in the emergency department and once again he had fever on the morning of 03/29/2019 which was 101.1.  Continues to complain of right lower extremity pain.  CBC this morning pending.  Lactic acidosis resolved.  Leukocytosis stable.  Lactic acidosis resolved.  Blood culture negative.  Continue cefepime, Flagyl and vancomycin per pharmacy and follow blood culture and tailor antibiotics accordingly.  Await plastic surgery evaluation and further recommendations.  Left a message to orthopedics about his fever.  I spoke to Michael Odor, PA from orthopedics on 03/29/2019 and according to him, patient can be discharged anytime if medically cleared.  He also mentioned that Michael Riggs will be the one who usually takes care of cases like this and he is on vacation this week and will return on Monday the earliest to see the patient.  ESRD: On MWF HD schedule.  Nephrology consulted.  Management per them.  Essential hypertension: Controlled.  Continue Norvasc.  Hyperlipidemia: Continue Lipitor.  Recent syphilis: Treated with penicillin [facility] department in early July.  DVT prophylaxis: Heparin Code Status: Full code Family Communication:  None present at bedside.  Plan of care discussed with patient in length and he verbalized understanding and agreed with  it. Disposition Plan: Back to SNF when medically stable and cleared by consultants.  Consultants:   Orthopedics  Plastic surgery  Procedures:   None  Antimicrobials:   IV cefepime 03/28/2019>  IV Flagyl 03/28/2019>  IV vancomycin 03/28/2019>   Subjective: Seen and examined.  Continues to complain of right lower extremity pain.  No new  complaint.  Objective: Vitals:   03/29/19 1816 03/29/19 2117 03/30/19 0429 03/30/19 0917  BP: 123/70 130/66 118/68 116/68  Pulse: 88 88 81 85  Resp: 18 16 20 18   Temp: 100.3 F (37.9 C) 99.2 F (37.3 C) 99.1 F (37.3 C) 98.7 F (37.1 C)  TempSrc: Oral Oral Oral Oral  SpO2: 93% 95% 93% 94%  Weight:  115.9 kg    Height:        Intake/Output Summary (Last 24 hours) at 03/30/2019 1137 Last data filed at 03/30/2019 1030 Gross per 24 hour  Intake 1060 ml  Output 4045 ml  Net -2985 ml   Filed Weights   03/29/19 0745 03/29/19 1217 03/29/19 2117  Weight: 119.6 kg 114.6 kg 115.9 kg    Examination:  General exam: Appears calm and comfortable, morbidly obese Respiratory system: Clear to auscultation. Respiratory effort normal. Cardiovascular system: S1 & S2 heard, RRR. No JVD, murmurs, rubs, gallops or clicks. No pedal edema. Gastrointestinal system: Abdomen is nondistended, soft and nontender. No organomegaly or masses felt. Normal bowel sounds heard. Central nervous system: Alert and oriented. No focal neurological deficits. Extremities: Status post left BKA with well-healing stump. Skin: Right lower extremity has diffuse eschar of the most of the right leg with some drainage.  Also has black eschar on the medial side of the left leg.  No drainage from there.  Slightly tender to palpation. Psychiatry: Judgement and insight appear poor, mood & affect flat.   Data Reviewed: I have personally reviewed following labs and imaging studies  CBC: Recent Labs  Lab 03/28/19 0538 03/29/19 0459  WBC 14.6* 14.5*  NEUTROABS 10.1*  --   HGB 7.1* 6.6*  HCT 23.8* 22.0*  MCV 91.5 91.7  PLT 360 [id number]   Basic Metabolic Panel: Recent Labs  Lab 03/28/19 0538 03/29/19 0459 03/30/19 0824  NA 135 134* 134*  K 4.1 3.9 3.4*  CL 95* 99 98  CO2 21* 23 26  GLUCOSE 78 112* 86  BUN 74* 37* 24*  CREATININE 9.93* 6.03* 4.97*  CALCIUM 8.5* 7.9* 8.4*   GFR: Estimated Creatinine Clearance: 23.4  mL/min (A) (by C-G formula based on SCr of 4.97 mg/dL (H)). Liver Function Tests: Recent Labs  Lab 03/28/19 0538 03/30/19 0824  AST 15 16  ALT 23 15  ALKPHOS 125 91  BILITOT 1.0 1.0  PROT 9.2* 8.1  ALBUMIN 2.3* 2.0*   No results for input(s): LIPASE, AMYLASE in the last 168 hours. No results for input(s): AMMONIA in the last 168 hours. Coagulation Profile: Recent Labs  Lab 03/28/19 0538  INR 1.3*   Cardiac Enzymes: No results for input(s): CKTOTAL, CKMB, CKMBINDEX, TROPONINI in the last 168 hours. BNP (last 3 results) No results for input(s): PROBNP in the last 8760 hours. HbA1C: No results for input(s): HGBA1C in the last 72 hours. CBG: Recent Labs  Lab 03/28/19 2238  GLUCAP 88   Lipid Profile: No results for input(s): CHOL, HDL, LDLCALC, TRIG, CHOLHDL, LDLDIRECT in the last 72 hours. Thyroid Function Tests: No results for input(s): TSH, T4TOTAL, FREET4, T3FREE, THYROIDAB in the last 72 hours. Anemia Panel: No results for input(s): VITAMINB12, FOLATE,  FERRITIN, TIBC, IRON, RETICCTPCT in the last 72 hours. Sepsis Labs: Recent Labs  Lab 03/28/19 0538 03/28/19 0933 03/28/19 2028 03/28/19 2349  LATICACIDVEN 1.4 2.2* 0.9 1.0    Recent Results (from the past 240 hour(s))  Culture, blood (Routine x 2)     Status: None (Preliminary result)   Collection Time: 03/28/19  5:20 AM   Specimen: BLOOD LEFT ARM  Result Value Ref Range Status   Specimen Description BLOOD LEFT ARM  Final   Special Requests   Final    BOTTLES DRAWN AEROBIC AND ANAEROBIC Blood Culture adequate volume   Culture   Final    NO GROWTH 1 DAY Performed at [facility], [place] [address], [place], [place] [address]    Report Status PENDING  Incomplete  Culture, blood (Routine x 2)     Status: None (Preliminary result)   Collection Time: 03/28/19  5:30 AM   Specimen: BLOOD LEFT ARM  Result Value Ref Range Status   Specimen Description BLOOD LEFT ARM  Final   Special Requests   Final     BOTTLES DRAWN AEROBIC AND ANAEROBIC Blood Culture results may not be optimal due to an excessive volume of blood received in culture bottles   Culture   Final    NO GROWTH 1 DAY Performed at [facility], [place] [address], [place], [place] [address]    Report Status PENDING  Incomplete  SARS Coronavirus 2 [facility] order, Performed in [facility] hospital lab) Nasopharyngeal Nasopharyngeal Swab     Status: None   Collection Time: 03/28/19  6:05 AM   Specimen: Nasopharyngeal Swab  Result Value Ref Range Status   SARS Coronavirus 2 NEGATIVE NEGATIVE Final    Comment: (NOTE) If result is NEGATIVE SARS-CoV-2 target nucleic acids are NOT DETECTED. The SARS-CoV-2 RNA is generally detectable in upper and lower  respiratory specimens during the acute phase of infection. The lowest  concentration of SARS-CoV-2 viral copies this assay can detect is 250  copies / mL. A negative result does not preclude SARS-CoV-2 infection  and should not be used as the sole basis for treatment or other  patient management decisions.  A negative result may occur with  improper specimen collection / handling, submission of specimen other  than nasopharyngeal swab, presence of viral mutation(s) within the  areas targeted by this assay, and inadequate number of viral copies  (<250 copies / mL). A negative result must be combined with clinical  observations, patient history, and epidemiological information. If result is POSITIVE SARS-CoV-2 target nucleic acids are DETECTED. The SARS-CoV-2 RNA is generally detectable in upper and lower  respiratory specimens dur ing the acute phase of infection.  Positive  results are indicative of active infection with SARS-CoV-2.  Clinical  correlation with patient history and other diagnostic information is  necessary to determine patient infection status.  Positive results do  not rule out bacterial infection or co-infection with other viruses. If result is PRESUMPTIVE  POSTIVE SARS-CoV-2 nucleic acids MAY BE PRESENT.   A presumptive positive result was obtained on the submitted specimen  and confirmed on repeat testing.  While 2019 novel coronavirus  (SARS-CoV-2) nucleic acids may be present in the submitted sample  additional confirmatory testing may be necessary for epidemiological  and / or clinical management purposes  to differentiate between  SARS-CoV-2 and other Sarbecovirus currently known to infect humans.  If clinically indicated additional testing with an alternate test  methodology [id number]) is advised. The SARS-CoV-2  RNA is generally  detectable in upper and lower respiratory sp ecimens during the acute  phase of infection. The expected result is Negative. Fact Sheet for Patients:  StrictlyIdeas.no Fact Sheet for Healthcare Providers: BankingDealers.co.za This test is not yet approved or cleared by the [place] FDA and has been authorized for detection and/or diagnosis of SARS-CoV-2 by FDA under an Emergency Use Authorization (EUA).  This EUA will remain in effect (meaning this test can be used) for the duration of the COVID-19 declaration under Section 564(b)(1) of the Act, 21 U.S.C. section 360bbb-3(b)(1), unless the authorization is terminated or revoked sooner. Performed at [facility], [place] [address], [place], [place] [address]       Radiology Studies: No results found.  Scheduled Meds: . sodium chloride   Intravenous Once  . sodium chloride   Intravenous Once  . atorvastatin  40 mg Oral Q2000  . bethanechol  25 mg Oral TID  . calcitRIOL  0.25 mcg Oral Q M,W,F-1800  . Chlorhexidine Gluconate Cloth  6 each Topical Q0600  . Chlorhexidine Gluconate Cloth  6 each Topical Q0600  . citalopram  40 mg Oral Daily  . darbepoetin (ARANESP) injection - DIALYSIS  150 mcg Intravenous Q Wed-HD  . feeding supplement (PRO-STAT SUGAR FREE 64)  30 mL Oral BID  . gabapentin  300 mg  Oral QHS  . heparin  5,000 Units Subcutaneous Q8H  . Melatonin  4.5 mg Oral QHS  . multivitamin  1 tablet Oral QHS  . oxyCODONE  5 mg Oral TID  . pantoprazole  40 mg Oral Daily  . polyethylene glycol  17 g Oral Daily  . senna-docusate  1 tablet Oral BID  . tamsulosin  0.4 mg Oral QPC breakfast   Continuous Infusions: . ceFEPime (MAXIPIME) IV 2 g (03/29/19 1648)  . [START ON 03/31/2019] ferric gluconate (FERRLECIT/NULECIT) IV    . metronidazole 500 mg (03/30/19 1047)  . vancomycin 1,000 mg (03/29/19 1116)     LOS: 2 days   Time spent: 32 minutes  Darliss Cheney, MD [facility] Pager [phone]  If 7PM-7AM, please contact night-coverage [email or web address] Password [facility] 03/30/2019, 11:37 AM

## 2019-03-30 NOTE — Plan of Care (Signed)
  Problem: Education: Goal: Knowledge of General Education information will improve Description: Including pain rating scale, medication(s)/side effects and non-pharmacologic comfort measures Outcome: Progressing   Problem: Pain Managment: Goal: General experience of comfort will improve Outcome: Progressing   

## 2019-03-31 LAB — CBC WITH DIFFERENTIAL/PLATELET
Abs Immature Granulocytes: 0.13 10*3/uL — ABNORMAL HIGH (ref 0.00–0.07)
Basophils Absolute: 0.1 10*3/uL (ref 0.0–0.1)
Basophils Relative: 1 %
Eosinophils Absolute: 1.1 10*3/uL — ABNORMAL HIGH (ref 0.0–0.5)
Eosinophils Relative: 9 %
HCT: 24.5 % — ABNORMAL LOW (ref 39.0–52.0)
Hemoglobin: 7.4 g/dL — ABNORMAL LOW (ref 13.0–17.0)
Immature Granulocytes: 1 %
Lymphocytes Relative: 14 %
Lymphs Abs: 1.7 10*3/uL (ref 0.7–4.0)
MCH: 27.3 pg (ref 26.0–34.0)
MCHC: 30.2 g/dL (ref 30.0–36.0)
MCV: 90.4 fL (ref 80.0–100.0)
Monocytes Absolute: 1.2 10*3/uL — ABNORMAL HIGH (ref 0.1–1.0)
Monocytes Relative: 10 %
Neutro Abs: 7.9 10*3/uL — ABNORMAL HIGH (ref 1.7–7.7)
Neutrophils Relative %: 65 %
Platelets: 325 10*3/uL (ref 150–400)
RBC: 2.71 MIL/uL — ABNORMAL LOW (ref 4.22–5.81)
RDW: 15.1 % (ref 11.5–15.5)
WBC: 12 10*3/uL — ABNORMAL HIGH (ref 4.0–10.5)
nRBC: 0 % (ref 0.0–0.2)

## 2019-03-31 LAB — COMPREHENSIVE METABOLIC PANEL
ALT: 14 U/L (ref 0–44)
AST: 16 U/L (ref 15–41)
Albumin: 1.9 g/dL — ABNORMAL LOW (ref 3.5–5.0)
Alkaline Phosphatase: 93 U/L (ref 38–126)
Anion gap: 12 (ref 5–15)
BUN: 39 mg/dL — ABNORMAL HIGH (ref 6–20)
CO2: 23 mmol/L (ref 22–32)
Calcium: 8.3 mg/dL — ABNORMAL LOW (ref 8.9–10.3)
Chloride: 98 mmol/L (ref 98–111)
Creatinine, Ser: 6.88 mg/dL — ABNORMAL HIGH (ref 0.61–1.24)
GFR calc Af Amer: 10 mL/min — ABNORMAL LOW (ref >60)
GFR calc non Af Amer: 9 mL/min — ABNORMAL LOW (ref >60)
Glucose, Bld: 92 mg/dL (ref 70–99)
Potassium: 3.2 mmol/L — ABNORMAL LOW (ref 3.5–5.1)
Sodium: 133 mmol/L — ABNORMAL LOW (ref 135–145)
Total Bilirubin: 0.9 mg/dL (ref 0.3–1.2)
Total Protein: 8.2 g/dL — ABNORMAL HIGH (ref 6.5–8.1)

## 2019-03-31 MED ORDER — HEPARIN SODIUM (PORCINE) 1000 UNIT/ML IJ SOLN
INTRAMUSCULAR | Status: AC
  Filled 2019-03-31: qty 4

## 2019-03-31 MED ORDER — OXYCODONE HCL 5 MG PO TABS
10.0000 mg | ORAL_TABLET | Freq: Three times a day (TID) | ORAL | Status: DC | PRN
  Administered 2019-03-31 – 2019-04-05 (×9): 10 mg via ORAL
  Filled 2019-03-31 (×9): qty 2

## 2019-03-31 MED ORDER — OXYCODONE HCL 5 MG PO TABS: 10.0000 mg | ORAL_TABLET | Freq: Three times a day (TID) | ORAL | Status: DC

## 2019-03-31 MED ORDER — METRONIDAZOLE 500 MG PO TABS
500.0000 mg | ORAL_TABLET | Freq: Three times a day (TID) | ORAL | Status: DC
  Administered 2019-03-31 – 2019-04-02 (×5): 500 mg via ORAL
  Filled 2019-03-31 (×5): qty 1

## 2019-03-31 NOTE — Plan of Care (Signed)
  Problem: Education: Goal: Knowledge of General Education information will improve Description: Including pain rating scale, medication(s)/side effects and non-pharmacologic comfort measures Outcome: Progressing   Problem: Pain Managment: Goal: General experience of comfort will improve Outcome: Progressing   

## 2019-03-31 NOTE — Progress Notes (Signed)
PROGRESS NOTE    Michael Riggs  [name] DOB: July 19, 1971 DOA: 03/28/2019 PCP: [name], MD   Brief Narrative:  Michael Riggs is a 48 y.o. male with medical history significant of HTN; HLD; and ESRD on TTS HD presented to ED with SOB starting the night before presentation,  +cough.  +fever.  R leg infection.  Essentially, he was admitted from 7/13-17 for progressive renal dysfunction, new ESRD with uremia-induced metabolic encephalopathy.  He had TDC placed on 7/14 and started on HD.  He was discharged to home on 7/17 and returned to the ER and was admitted from 7/18-22 for anasarca and volume overload and was discharged to CIR on 7/22 because of generalized deconditioning with ambulatory dysfunction.  He was noted to have multiple skin tears and ulceration of the RLE and received wound care while inpatient.  He was admitted to CIR from 7/22-8/25 due to decreased mobility.  At the time of d/c, he had limited assistance at home and so the decision was made to discharge to SNF.  He went to [facility] on 8/25. His wounds appeared to be healing reasonably well at that time.  He was sent from his skilled nursing facility for fever and confusion.  Upon arrival to the ED, he was febrile, and hypoxic with oxygen saturation of 85% with EMS requiring 2 to 3 L of oxygen. Also worsening leg pain - has drainage with necrotic tissue, no apparent cellulitis and no gas on xray.  No apparent deep space infection.  COVID negative.  Patient was admitted secondary to sepsis likely secondary to right lower extremity wound infection/cellulitis.  Initial lactic acid was normal but repeat was 2.2.  Chest x-ray was normal.  Patient was started on Rocephin, Flagyl and vancomycin.  He was evaluated by orthopedics in the ED who had said that patient does not seem to need emergent surgical intervention and consulted plastic surgeon to be on board however no plastic surgery has seen this patient since  admission which has been more than 2 days now.  Assessment & Plan:   Principal Problem:   Wound infection Active Problems:   Hypertension   ESRD (end stage renal disease) on dialysis (HCC)   Dyslipidemia   Diabetes mellitus with peripheral vascular disease ([place])   Depression   Physical deconditioning  Sepsis likely secondary to right lower extremity wound infection/cellulitis: He was febrile in the emergency department and once again he had fever on the morning of 03/29/2019 which was 101.1 but he has remained afebrile for 24 hours now.  Continues to complain of right lower extremity pain.  CBC shows a stable leukocytosis.  Lactic acidosis resolved.  Leukocytosis stable.  Lactic acidosis resolved.  Blood culture negative.  Continue cefepime and vancomycin per pharmacy and discontinue Flagyl and follow blood culture and tailor antibiotics accordingly.  Await plastic surgery evaluation and further recommendations.  Left a message to orthopedics about his fever and  spoke to Hilbert Odor, PA from orthopedics on 03/29/2019 and again on 03/30/2019 and again today that plastic surgery has not seen the patient yet according to him, patient can be discharged anytime if medically cleared.  He also mentioned that Dr. Sharol Given will be the one who usually takes care of cases like this and he is on vacation this week and will return on Monday the earliest to see the patient.  Since patient had fever yesterday and we do not have any solid plan for his right lower extremity, I am going  to keep him here at least for next 24 hours which will provide ample time for consulting physicians to come up with the plans.  Will increase his oxycodone to 10 mg but may get every 8 as needed instead of scheduled.  ESRD: On MWF HD schedule.  Nephrology consulted.  Management per them.  Essential hypertension: Controlled.  Continue Norvasc.  Hyperlipidemia: Continue Lipitor.  Recent syphilis: Treated with penicillin at health  department in early July.  DVT prophylaxis: Heparin Code Status: Full code Family Communication:  None present at bedside.  Plan of care discussed with patient in length and he verbalized understanding and agreed with it. Disposition Plan: Back to SNF when medically stable and cleared by consultants.  Consultants:   Orthopedics  Plastic surgery  Procedures:   None  Antimicrobials:   IV cefepime 03/28/2019>  IV Flagyl 03/28/2019>  IV vancomycin 03/28/2019>   Subjective: Patient seen and examined.  Continues to complain of right lower extremity pain.  Continues to complain of the care that he is receiving from nurses here and also the care that he received from nurses at his nursing home.  Requesting to change his medications.  Objective: Vitals:   03/31/19 0428 03/31/19 0436 03/31/19 1016 03/31/19 1300  BP: 98/81 107/68 104/63 (P) 121/76  Pulse: 79 81 79 (P) 75  Resp: 18 18 18    Temp: 98.4 F (36.9 C)  98.7 F (37.1 C)   TempSrc: Oral  Oral   SpO2: 100% 94% 96%   Weight:      Height:        Intake/Output Summary (Last 24 hours) at 03/31/2019 1309 Last data filed at 03/31/2019 1230 Gross per 24 hour  Intake 1440.04 ml  Output 0 ml  Net 1440.04 ml   Filed Weights   03/29/19 0745 03/29/19 1217 03/29/19 2117  Weight: 119.6 kg 114.6 kg 115.9 kg    Examination:  General exam: Appears calm and comfortable, obese Respiratory system: Clear to auscultation. Respiratory effort normal. Cardiovascular system: S1 & S2 heard, RRR. No JVD, murmurs, rubs, gallops or clicks. No pedal edema. Gastrointestinal system: Abdomen is nondistended, soft and nontender. No organomegaly or masses felt. Normal bowel sounds heard. Central nervous system: Alert and oriented. No focal neurological deficits. Extremities: Symmetric 5 x 5 power. Skin: Right lower extremity has diffuse eschar of most of the right leg.  He also has some eschar on the medial side of left leg.  He has dressing in the  right lower extremity now. Psychiatry: Judgement and insight appear poor. Mood & affect appropriate.   Data Reviewed: I have personally reviewed following labs and imaging studies  CBC: Recent Labs  Lab 03/28/19 0538 03/29/19 0459 03/31/19 0537  WBC 14.6* 14.5* 12.0*  NEUTROABS 10.1*  --  7.9*  HGB 7.1* 6.6* 7.4*  HCT 23.8* 22.0* 24.5*  MCV 91.5 91.7 90.4  PLT 360 320 [id number]   Basic Metabolic Panel: Recent Labs  Lab 03/28/19 0538 03/29/19 0459 03/30/19 0824 03/31/19 0537  NA 135 134* 134* 133*  K 4.1 3.9 3.4* 3.2*  CL 95* 99 98 98  CO2 21* 23 26 23   GLUCOSE 78 112* 86 92  BUN 74* 37* 24* 39*  CREATININE 9.93* 6.03* 4.97* 6.88*  CALCIUM 8.5* 7.9* 8.4* 8.3*   GFR: Estimated Creatinine Clearance: 16.9 mL/min (A) (by C-G formula based on SCr of 6.88 mg/dL (H)). Liver Function Tests: Recent Labs  Lab 03/28/19 0538 03/30/19 0824 03/31/19 0537  AST 15 16 16   ALT  23 15 14   ALKPHOS 125 91 93  BILITOT 1.0 1.0 0.9  PROT 9.2* 8.1 8.2*  ALBUMIN 2.3* 2.0* 1.9*   No results for input(s): LIPASE, AMYLASE in the last 168 hours. No results for input(s): AMMONIA in the last 168 hours. Coagulation Profile: Recent Labs  Lab 03/28/19 0538  INR 1.3*   Cardiac Enzymes: No results for input(s): CKTOTAL, CKMB, CKMBINDEX, TROPONINI in the last 168 hours. BNP (last 3 results) No results for input(s): PROBNP in the last 8760 hours. HbA1C: No results for input(s): HGBA1C in the last 72 hours. CBG: Recent Labs  Lab 03/28/19 2238  GLUCAP 88   Lipid Profile: No results for input(s): CHOL, HDL, LDLCALC, TRIG, CHOLHDL, LDLDIRECT in the last 72 hours. Thyroid Function Tests: No results for input(s): TSH, T4TOTAL, FREET4, T3FREE, THYROIDAB in the last 72 hours. Anemia Panel: No results for input(s): VITAMINB12, FOLATE, FERRITIN, TIBC, IRON, RETICCTPCT in the last 72 hours. Sepsis Labs: Recent Labs  Lab 03/28/19 0538 03/28/19 0933 03/28/19 2028 03/28/19 2349  LATICACIDVEN 1.4  2.2* 0.9 1.0    Recent Results (from the past 240 hour(s))  Culture, blood (Routine x 2)     Status: None (Preliminary result)   Collection Time: 03/28/19  5:20 AM   Specimen: BLOOD LEFT ARM  Result Value Ref Range Status   Specimen Description BLOOD LEFT ARM  Final   Special Requests   Final    BOTTLES DRAWN AEROBIC AND ANAEROBIC Blood Culture adequate volume   Culture   Final    NO GROWTH 3 DAYS Performed at [facility], [place] [address] [place], [place] [address]    Report Status PENDING  Incomplete  Culture, blood (Routine x 2)     Status: None (Preliminary result)   Collection Time: 03/28/19  5:30 AM   Specimen: BLOOD LEFT ARM  Result Value Ref Range Status   Specimen Description BLOOD LEFT ARM  Final   Special Requests   Final    BOTTLES DRAWN AEROBIC AND ANAEROBIC Blood Culture results may not be optimal due to an excessive volume of blood received in culture bottles   Culture   Final    NO GROWTH 3 DAYS Performed at [facility], [place] [address], [place], [place] [address]    Report Status PENDING  Incomplete  SARS Coronavirus 2 [facility] order, Performed in [facility] hospital lab) Nasopharyngeal Nasopharyngeal Swab     Status: None   Collection Time: 03/28/19  6:05 AM   Specimen: Nasopharyngeal Swab  Result Value Ref Range Status   SARS Coronavirus 2 NEGATIVE NEGATIVE Final    Comment: (NOTE) If result is NEGATIVE SARS-CoV-2 target nucleic acids are NOT DETECTED. The SARS-CoV-2 RNA is generally detectable in upper and lower  respiratory specimens during the acute phase of infection. The lowest  concentration of SARS-CoV-2 viral copies this assay can detect is 250  copies / mL. A negative result does not preclude SARS-CoV-2 infection  and should not be used as the sole basis for treatment or other  patient management decisions.  A negative result may occur with  improper specimen collection / handling, submission of specimen other  than nasopharyngeal  swab, presence of viral mutation(s) within the  areas targeted by this assay, and inadequate number of viral copies  (<250 copies / mL). A negative result must be combined with clinical  observations, patient history, and epidemiological information. If result is POSITIVE SARS-CoV-2 target nucleic acids are DETECTED. The SARS-CoV-2 RNA is  generally detectable in upper and lower  respiratory specimens dur ing the acute phase of infection.  Positive  results are indicative of active infection with SARS-CoV-2.  Clinical  correlation with patient history and other diagnostic information is  necessary to determine patient infection status.  Positive results do  not rule out bacterial infection or co-infection with other viruses. If result is PRESUMPTIVE POSTIVE SARS-CoV-2 nucleic acids MAY BE PRESENT.   A presumptive positive result was obtained on the submitted specimen  and confirmed on repeat testing.  While 2019 novel coronavirus  (SARS-CoV-2) nucleic acids may be present in the submitted sample  additional confirmatory testing may be necessary for epidemiological  and / or clinical management purposes  to differentiate between  SARS-CoV-2 and other Sarbecovirus currently known to infect humans.  If clinically indicated additional testing with an alternate test  methodology [phone]) is advised. The SARS-CoV-2 RNA is generally  detectable in upper and lower respiratory sp ecimens during the acute  phase of infection. The expected result is Negative. Fact Sheet for Patients:  StrictlyIdeas.no Fact Sheet for Healthcare Providers: BankingDealers.co.za This test is not yet approved or cleared by the [place] FDA and has been authorized for detection and/or diagnosis of SARS-CoV-2 by FDA under an Emergency Use Authorization (EUA).  This EUA will remain in effect (meaning this test can be used) for the duration of the COVID-19 declaration  under Section 564(b)(1) of the Act, 21 U.S.C. section 360bbb-3(b)(1), unless the authorization is terminated or revoked sooner. Performed at [facility], [place] [address], [place], [place] [address]       Radiology Studies: No results found.  Scheduled Meds: . sodium chloride   Intravenous Once  . sodium chloride   Intravenous Once  . atorvastatin  40 mg Oral Q2000  . bethanechol  25 mg Oral TID  . calcitRIOL  0.25 mcg Oral Q M,W,F-1800  . Chlorhexidine Gluconate Cloth  6 each Topical Q0600  . Chlorhexidine Gluconate Cloth  6 each Topical Q0600  . citalopram  40 mg Oral Daily  . darbepoetin (ARANESP) injection - DIALYSIS  150 mcg Intravenous Q Wed-HD  . feeding supplement (PRO-STAT SUGAR FREE 64)  30 mL Oral BID  . gabapentin  300 mg Oral QHS  . heparin  5,000 Units Subcutaneous Q8H  . Melatonin  4.5 mg Oral QHS  . metroNIDAZOLE  500 mg Oral Q8H  . multivitamin  1 tablet Oral QHS  . pantoprazole  40 mg Oral Daily  . polyethylene glycol  17 g Oral Daily  . senna-docusate  1 tablet Oral BID  . tamsulosin  0.4 mg Oral QPC breakfast   Continuous Infusions: . ceFEPime (MAXIPIME) IV 2 g (03/29/19 1648)  . ferric gluconate (FERRLECIT/NULECIT) IV    . vancomycin 1,000 mg (03/29/19 1116)     LOS: 3 days   Time spent: 34 minutes  Darliss Cheney, MD [facility] Pager [phone]  If 7PM-7AM, please contact night-coverage [email or web address] Password [email or web address] 03/31/2019, 1:09 PM

## 2019-03-31 NOTE — Progress Notes (Signed)
Reason for Consult: Leg wound Referring Physician: Hilbert Odor, PA-C  Michael Riggs is an 48 y.o. male  HPI: Michael Riggs is a 48 yo male with a medical history significant for HTN, HLD, ESRD on TTS HD.   Briefly, he was admitted on 7/13-7/17 for renal dysfunction and uremia-induced encephalopathy.  He was discharged from that visit on 17 July and then return to the emergency room the following day (7/18) and remained in the hospital until 7/22.  During the visit from 7/18 to 7/22 he was noted to have multiple skin tears and ulceration of his right lower extremity.  He then spent approximately 1 month from 7/22-8/25 in inpatient rehab for decreased mobility. He reported the wound was improving at that time.  Patient discharged to [facility] on 8/25 and had been there until admission. He reported the wounds were doing fine and then suddenly it felt as if things began to worsen. He noted fevers and that he began to feel sick.  Patient arrived to the ED on 03/28/2019 with a chief complaint of weakness, right leg pain and cough. Sent by SNF for fever and confusion.  Currently he is seen at hemodialysis. He is resting in bed on evaluation. No current fevers or chills. He has significant right leg pain. Kerlix bandage wrapped over RLE. Visible eschar/scabbing noted over medial right thigh.  Patient sleepy on evaluation.    Past Medical History:  Diagnosis Date  . Anemia   . Depression   . Diabetes mellitus with peripheral vascular disease ([place])   . Dyslipidemia   . Dyspnea   . ESRD (end stage renal disease) on dialysis ([place]) 01/2019   MWF  . GERD (gastroesophageal reflux disease)   . Hypertension   . Physical deconditioning   . Syphilis 01/2019    Past Surgical History:  Procedure Laterality Date  . AV FISTULA PLACEMENT Left 01/09/2019   Procedure: ARTERIOVENOUS (AV) FISTULA CREATION LEFT UPPER ARM;  Surgeon: [name], MD;  Location: [facility] OR;  Service: Vascular;   Laterality: Left;  . below the knee amputation Left   . EYE SURGERY    . IR FLUORO GUIDE CV LINE RIGHT  01/31/2019  . IR FLUORO GUIDE CV LINE RIGHT  02/03/2019  . IR US GUIDE VASC ACCESS RIGHT  01/31/2019  . IR US GUIDE VASC ACCESS RIGHT  02/03/2019    No family history on file.  Social History:  reports that he quit smoking about 2 months ago. His smoking use included cigarettes. He started smoking about 4 months ago. He has never used smokeless tobacco. He reports previous alcohol use. He reports previous drug use.  Allergies: No Known Allergies  Medications: I have reviewed the patient's current medications.  Results for orders placed or performed during the hospital encounter of 03/28/19 (from the past 48 hour(s))  Comprehensive metabolic panel     Status: Abnormal   Collection Time: 03/30/19  8:24 AM  Result Value Ref Range   Sodium 134 (L) 135 - 145 mmol/L   Potassium 3.4 (L) 3.5 - 5.1 mmol/L   Chloride 98 98 - 111 mmol/L   CO2 26 22 - 32 mmol/L   Glucose, Bld 86 70 - 99 mg/dL   BUN 24 (H) 6 - 20 mg/dL   Creatinine, Ser 4.97 (H) 0.61 - 1.24 mg/dL   Calcium 8.4 (L) 8.9 - 10.3 mg/dL   Total Protein 8.1 6.5 - 8.1 g/dL   Albumin 2.0 (L) 3.5 - 5.0 g/dL  AST 16 15 - 41 U/L   ALT 15 0 - 44 U/L   Alkaline Phosphatase 91 38 - 126 U/L   Total Bilirubin 1.0 0.3 - 1.2 mg/dL   GFR calc non Af Amer 13 (L) >60 mL/min   GFR calc Af Amer 15 (L) >60 mL/min   Anion gap 10 5 - 15    Comment: Performed at [facility] [address], [place], [place] [address]  CBC with Differential/Platelet     Status: Abnormal   Collection Time: 03/31/19  5:37 AM  Result Value Ref Range   WBC 12.0 (H) 4.0 - 10.5 K/uL   RBC 2.71 (L) 4.22 - 5.81 MIL/uL   Hemoglobin 7.4 (L) 13.0 - 17.0 g/dL   HCT 24.5 (L) 39.0 - 52.0 %   MCV 90.4 80.0 - 100.0 fL   MCH 27.3 26.0 - 34.0 pg   MCHC 30.2 30.0 - 36.0 g/dL   RDW 15.1 11.5 - 15.5 %   Platelets 325 150 - 400 K/uL   nRBC 0.0 0.0 - 0.2 %   Neutrophils  Relative % 65 %   Neutro Abs 7.9 (H) 1.7 - 7.7 K/uL   Lymphocytes Relative 14 %   Lymphs Abs 1.7 0.7 - 4.0 K/uL   Monocytes Relative 10 %   Monocytes Absolute 1.2 (H) 0.1 - 1.0 K/uL   Eosinophils Relative 9 %   Eosinophils Absolute 1.1 (H) 0.0 - 0.5 K/uL   Basophils Relative 1 %   Basophils Absolute 0.1 0.0 - 0.1 K/uL   Immature Granulocytes 1 %   Abs Immature Granulocytes 0.13 (H) 0.00 - 0.07 K/uL    Comment: Performed at [facility] [address] [place], [place] [address]  Comprehensive metabolic panel     Status: Abnormal   Collection Time: 03/31/19  5:37 AM  Result Value Ref Range   Sodium 133 (L) 135 - 145 mmol/L   Potassium 3.2 (L) 3.5 - 5.1 mmol/L   Chloride 98 98 - 111 mmol/L   CO2 23 22 - 32 mmol/L   Glucose, Bld 92 70 - 99 mg/dL   BUN 39 (H) 6 - 20 mg/dL   Creatinine, Ser 6.88 (H) 0.61 - 1.24 mg/dL   Calcium 8.3 (L) 8.9 - 10.3 mg/dL   Total Protein 8.2 (H) 6.5 - 8.1 g/dL   Albumin 1.9 (L) 3.5 - 5.0 g/dL   AST 16 15 - 41 U/L   ALT 14 0 - 44 U/L   Alkaline Phosphatase 93 38 - 126 U/L   Total Bilirubin 0.9 0.3 - 1.2 mg/dL   GFR calc non Af Amer 9 (L) >60 mL/min   GFR calc Af Amer 10 (L) >60 mL/min   Anion gap 12 5 - 15    Comment: Performed at [facility], [place] [address] [place],  [address]    No results found.  Review of Systems  Constitutional: Negative for chills and fever.  Respiratory: Negative for cough, shortness of breath and wheezing.   Cardiovascular: Negative for chest pain, palpitations and orthopnea.  Musculoskeletal: Positive for myalgias.  Skin: Negative for itching and rash.       + wound   Neurological: Positive for weakness. Negative for dizziness and headaches.   Blood pressure 121/76, pulse 75, temperature 98.7 F (37.1 C), temperature source Oral, resp. rate 16, height 5\' 10"  (1.778 m), weight 117.6 kg, SpO2 96 %. Physical Exam  Constitutional: He is oriented to person, place, and  time. He appears well-developed.   HENT:  Head: Normocephalic and atraumatic.  Cardiovascular: Normal rate.  Pulses:      Dorsalis pedis pulses are 2+ on the right side.  Respiratory: Effort normal. No respiratory distress.  GI: Soft. He exhibits no distension.  Musculoskeletal:        General: Deformity (Left BKA) present.       Legs:  Neurological: He is alert and oriented to person, place, and time.  Skin: Skin is warm. He is not diaphoretic.  Psychiatric: He has a normal mood and affect. His behavior is normal.    Assessment/Plan:  No plan for surgical intervention at this time.  Maximize nutrition status for optimal healing. Recommended Elevation as able. Recommendations based on wound care nurse.  If something changes, feel free to consult. We remain available if situation changes.  Carola Rhine Shanayah Kaffenberger 03/31/2019, 2:03 PM

## 2019-03-31 NOTE — Procedures (Signed)
Patient seen on Hemodialysis. BP 121/76   Pulse 75   Temp 98.7 F (37.1 C) (Oral)   Resp 16   Ht 5\' 10"  (1.778 m)   Wt 117.6 kg   SpO2 96%   BMI 37.20 kg/m   QB 400, UF goal 4L Tolerating treatment without complaints at this time.   Elmarie Shiley MD [facility]. Office # [phone] Pager # [phone] 1:35 PM

## 2019-03-31 NOTE — Progress Notes (Signed)
PROGRESS NOTE    Michael Riggs  [name]  DOB: 10-07-1970  DOA: 03/28/2019 PCP: [name], MD  Brief Narrative:  48 y.o.malewith medical history significant ofHTN; HLD; and ESRD on TTS HD presented to ED from SNF with fever, confusion,worsening right leg pain cough/SOB and noted to have R leg infection.  Patient has had a complicated clinical course since July. He was admitted from 7/13-17 for progressive renal dysfunction, new ESRD with uremia-induced metabolic encephalopathy. He had TDC placed on 7/14 and started on HD. He was discharged to home on 7/17 and returned to the ER and was admitted from 7/18-22 for anasarca and volume overload and was discharged to CIR on 7/22 because of generalized deconditioning with ambulatory dysfunction. He was noted to have multiple skin tears and ulceration of the RLE and received wound care while inpatient.At the time of d/c from CIR on 8/25, he had limited assistance at home, hence  discharged to SNF. He went to [facility] on 8/25. His wounds reportedly were healing reasonably well at that time.  Upon arrival to the ED, he was febrile 101.1, and hypoxic with oxygen saturation of 85% with EMS requiring 2 to 3 L of oxygen. He had RLE drainage with necrotic tissue, no apparent cellulitis and no gas on xray. No apparent deep space infection. COVID negative.  Patient was admitted for sepsis likely secondary to right lower extremity wound infection.  Chest x-ray was normal.  Patient was started on Rocephin, Flagyl and vancomycin.  He was evaluated by orthopedics in the ED who had said that patient does not seem to need emergent surgical intervention and consulted plastic surgeon to be on board however no plastic surgeon has seen yet. Dr Sharol Given on vacation, antibiotics tapered , ?been spiking temp with persistent pain limiting discharge.Leukocytosis stable.  Lactic acidosis resolved.  Blood culture negative.  Subjective: Patient reporting  several loose stools overnight.  Objective: Vitals:   03/31/19 1630 03/31/19 1648 03/31/19 1805 03/31/19 1934  BP: (!) 134/49 (!) 131/49 (!) 114/58 121/73  Pulse: 77 79 87 84  Resp:  14 18 18   Temp:  98.2 F (36.8 C) 99 F (37.2 C) 98.9 F (37.2 C)  TempSrc:  Oral Oral Oral  SpO2:  96% 98% 96%  Weight:  113.8 kg    Height:        Intake/Output Summary (Last 24 hours) at 03/31/2019 2140 Last data filed at 03/31/2019 1829 Gross per 24 hour  Intake 1274.48 ml  Output 4000 ml  Net -2725.52 ml   Filed Weights   03/29/19 2117 03/31/19 1240 03/31/19 1648  Weight: 115.9 kg 117.6 kg 113.8 kg    Physical Examination:  General exam: Appears calm and comfortable  Respiratory system: Clear to auscultation. Respiratory effort normal. Cardiovascular system: S1 & S2 heard, RRR. No JVD, murmurs, rubs, gallops or clicks. No pedal edema. Gastrointestinal system: Abdomen is nondistended, soft and nontender. No organomegaly or masses felt. Normal bowel sounds heard. Central nervous system: Alert and oriented. No focal neurological deficits. Extremities:Right lower extremity has diffuse eschar of most of the right leg.  He also has some eschar on the medial side of left leg.  He has dressing in the right lower extremity now Dressing along. Psychiatry: Judgement and insight appear normal. Mood & affect appropriate.     Data Reviewed: I have personally reviewed following labs and imaging studies  CBC: Recent Labs  Lab 03/28/19 0538 03/29/19 0459 03/31/19 0537  WBC 14.6* 14.5* 12.0*  NEUTROABS  10.1*  --  7.9*  HGB 7.1* 6.6* 7.4*  HCT 23.8* 22.0* 24.5*  MCV 91.5 91.7 90.4  PLT 360 320 [id number]   Basic Metabolic Panel: Recent Labs  Lab 03/28/19 0538 03/29/19 0459 03/30/19 0824 03/31/19 0537  NA 135 134* 134* 133*  K 4.1 3.9 3.4* 3.2*  CL 95* 99 98 98  CO2 21* 23 26 23   GLUCOSE 78 112* 86 92  BUN 74* 37* 24* 39*  CREATININE 9.93* 6.03* 4.97* 6.88*  CALCIUM 8.5* 7.9* 8.4* 8.3*    GFR: Estimated Creatinine Clearance: 16.8 mL/min (A) (by C-G formula based on SCr of 6.88 mg/dL (H)). Liver Function Tests: Recent Labs  Lab 03/28/19 0538 03/30/19 0824 03/31/19 0537  AST 15 16 16   ALT 23 15 14   ALKPHOS 125 91 93  BILITOT 1.0 1.0 0.9  PROT 9.2* 8.1 8.2*  ALBUMIN 2.3* 2.0* 1.9*   No results for input(s): LIPASE, AMYLASE in the last 168 hours. No results for input(s): AMMONIA in the last 168 hours. Coagulation Profile: Recent Labs  Lab 03/28/19 0538  INR 1.3*   Cardiac Enzymes: No results for input(s): CKTOTAL, CKMB, CKMBINDEX, TROPONINI in the last 168 hours. BNP (last 3 results) No results for input(s): PROBNP in the last 8760 hours. HbA1C: No results for input(s): HGBA1C in the last 72 hours. CBG: Recent Labs  Lab 03/28/19 2238  GLUCAP 88   Lipid Profile: No results for input(s): CHOL, HDL, LDLCALC, TRIG, CHOLHDL, LDLDIRECT in the last 72 hours. Thyroid Function Tests: No results for input(s): TSH, T4TOTAL, FREET4, T3FREE, THYROIDAB in the last 72 hours. Anemia Panel: No results for input(s): VITAMINB12, FOLATE, FERRITIN, TIBC, IRON, RETICCTPCT in the last 72 hours. Sepsis Labs: Recent Labs  Lab 03/28/19 0538 03/28/19 0933 03/28/19 2028 03/28/19 2349  LATICACIDVEN 1.4 2.2* 0.9 1.0    Recent Results (from the past 240 hour(s))  Culture, blood (Routine x 2)     Status: None (Preliminary result)   Collection Time: 03/28/19  5:20 AM   Specimen: BLOOD LEFT ARM  Result Value Ref Range Status   Specimen Description BLOOD LEFT ARM  Final   Special Requests   Final    BOTTLES DRAWN AEROBIC AND ANAEROBIC Blood Culture adequate volume   Culture   Final    NO GROWTH 3 DAYS Performed at [facility], [place] [address], [place], [place] [address]    Report Status PENDING  Incomplete  Culture, blood (Routine x 2)     Status: None (Preliminary result)   Collection Time: 03/28/19  5:30 AM   Specimen: BLOOD LEFT ARM  Result Value Ref Range  Status   Specimen Description BLOOD LEFT ARM  Final   Special Requests   Final    BOTTLES DRAWN AEROBIC AND ANAEROBIC Blood Culture results may not be optimal due to an excessive volume of blood received in culture bottles   Culture   Final    NO GROWTH 3 DAYS Performed at [facility], [place] [address] [place], [place] [address]    Report Status PENDING  Incomplete  SARS Coronavirus 2 [facility] order, Performed in [facility] hospital lab) Nasopharyngeal Nasopharyngeal Swab     Status: None   Collection Time: 03/28/19  6:05 AM   Specimen: Nasopharyngeal Swab  Result Value Ref Range Status   SARS Coronavirus 2 NEGATIVE NEGATIVE Final    Comment: (NOTE) If result is NEGATIVE SARS-CoV-2 target nucleic acids are NOT DETECTED. The SARS-CoV-2 RNA is generally detectable in  upper and lower  respiratory specimens during the acute phase of infection. The lowest  concentration of SARS-CoV-2 viral copies this assay can detect is 250  copies / mL. A negative result does not preclude SARS-CoV-2 infection  and should not be used as the sole basis for treatment or other  patient management decisions.  A negative result may occur with  improper specimen collection / handling, submission of specimen other  than nasopharyngeal swab, presence of viral mutation(s) within the  areas targeted by this assay, and inadequate number of viral copies  (<250 copies / mL). A negative result must be combined with clinical  observations, patient history, and epidemiological information. If result is POSITIVE SARS-CoV-2 target nucleic acids are DETECTED. The SARS-CoV-2 RNA is generally detectable in upper and lower  respiratory specimens dur ing the acute phase of infection.  Positive  results are indicative of active infection with SARS-CoV-2.  Clinical  correlation with patient history and other diagnostic information is  necessary to determine patient infection status.  Positive results do  not rule out  bacterial infection or co-infection with other viruses. If result is PRESUMPTIVE POSTIVE SARS-CoV-2 nucleic acids MAY BE PRESENT.   A presumptive positive result was obtained on the submitted specimen  and confirmed on repeat testing.  While 2019 novel coronavirus  (SARS-CoV-2) nucleic acids may be present in the submitted sample  additional confirmatory testing may be necessary for epidemiological  and / or clinical management purposes  to differentiate between  SARS-CoV-2 and other Sarbecovirus currently known to infect humans.  If clinically indicated additional testing with an alternate test  methodology [phone]) is advised. The SARS-CoV-2 RNA is generally  detectable in upper and lower respiratory sp ecimens during the acute  phase of infection. The expected result is Negative. Fact Sheet for Patients:  StrictlyIdeas.no Fact Sheet for Healthcare Providers: BankingDealers.co.za This test is not yet approved or cleared by the [place] FDA and has been authorized for detection and/or diagnosis of SARS-CoV-2 by FDA under an Emergency Use Authorization (EUA).  This EUA will remain in effect (meaning this test can be used) for the duration of the COVID-19 declaration under Section 564(b)(1) of the Act, 21 U.S.C. section 360bbb-3(b)(1), unless the authorization is terminated or revoked sooner. Performed at [facility], [place] [address], [place], [place] [address]       Radiology Studies: No results found.      Scheduled Meds: . sodium chloride   Intravenous Once  . sodium chloride   Intravenous Once  . atorvastatin  40 mg Oral Q2000  . bethanechol  25 mg Oral TID  . Chlorhexidine Gluconate Cloth  6 each Topical Q0600  . Chlorhexidine Gluconate Cloth  6 each Topical Q0600  . citalopram  40 mg Oral Daily  . darbepoetin (ARANESP) injection - DIALYSIS  150 mcg Intravenous Q Wed-HD  . feeding supplement (PRO-STAT SUGAR  FREE 64)  30 mL Oral BID  . gabapentin  300 mg Oral QHS  . heparin  5,000 Units Subcutaneous Q8H  . Melatonin  4.5 mg Oral QHS  . metroNIDAZOLE  500 mg Oral Q8H  . multivitamin  1 tablet Oral QHS  . pantoprazole  40 mg Oral Daily  . polyethylene glycol  17 g Oral Daily  . senna-docusate  1 tablet Oral BID  . tamsulosin  0.4 mg Oral QPC breakfast   Continuous Infusions: . ceFEPime (MAXIPIME) IV 2 g (03/31/19 1752)  . vancomycin 1,000 mg (03/31/19 2022)  Assessment & Plan:    1.Sepsis likely secondary to right lower extremity wound infection/cellulitis: Patient had temp spikes during the hospital course with T-max of 101.84F.  He is complaining of severe right lower extremity pain.  States Neurontin does not help.CBC shows a stable leukocytosis.  Lactic acidosis resolved.Blood culture negative.  Continue cefepime and vancomycin per pharmacy and discontinue Flagyl and follow blood culture and tailor antibiotics accordingly.  Await plastic surgery evaluation and further recommendations.  Oxycodone was changed to PRN use on 9/11.  2.ESRD: On MWF HD schedule.  Nephrology consulted.  Management per them.  3. Essential hypertension: Controlled.  Continue Norvasc.  4. Hyperlipidemia: Continue Lipitor.  5. Recent syphilis: [facility] July.  6.  Diarrhea: Hold laxatives (MiraLAX and Colace) and monitor.  If continues to have diarrhea, will check C. difficile  DVT prophylaxis: Heparin Code Status: Full code Family / Patient Communication: Discussed with patient and bedside nurse Disposition Plan: Home when medically cleared     LOS: 3 days    Time spent: [place]    Guilford Shi, MD [facility] Pager [phone]  If 7PM-7AM, please contact night-coverage [email or web address] Password [facility] 03/31/2019, 9:40 PM

## 2019-03-31 NOTE — Progress Notes (Signed)
[facility] Progress Note   Subjective:  Seen in room - for HD later today. C/o frequent soft BMs overnight, no abd pain currently. No CP/dyspnea at the moment. Wants to speak to his brother - assisted him to call.  Objective Vitals:   03/30/19 2145 03/31/19 0428 03/31/19 0436 03/31/19 1016  BP: (!) 149/81 98/81 107/68 104/63  Pulse: 79 79 81 79  Resp: 18 18 18 18   Temp: 99.3 F (37.4 C) 98.4 F (36.9 C)  98.7 F (37.1 C)  TempSrc: Oral Oral  Oral  SpO2: 96% 100% 94% 96%  Weight:      Height:       Physical Exam General: Obese man, NAD Heart: RRR; no murmur Lungs: CTA anteriorly Abdomen: soft, non-tender; near resolved flank edema Extremities: RLE wound bandaged; L BKA without stump edema Dialysis Access: TDC in R chest; no erythema. Maturing LUE AVF + thrill  Additional Objective Labs: Basic Metabolic Panel: Recent Labs  Lab 03/29/19 0459 03/30/19 0824 03/31/19 0537  NA 134* 134* 133*  K 3.9 3.4* 3.2*  CL 99 98 98  CO2 23 26 23   GLUCOSE 112* 86 92  BUN 37* 24* 39*  CREATININE 6.03* 4.97* 6.88*  CALCIUM 7.9* 8.4* 8.3*   Liver Function Tests: Recent Labs  Lab 03/28/19 0538 03/30/19 0824 03/31/19 0537  AST 15 16 16   ALT 23 15 14   ALKPHOS 125 91 93  BILITOT 1.0 1.0 0.9  PROT 9.2* 8.1 8.2*  ALBUMIN 2.3* 2.0* 1.9*   CBC: Recent Labs  Lab 03/28/19 0538 03/29/19 0459 03/31/19 0537  WBC 14.6* 14.5* 12.0*  NEUTROABS 10.1*  --  7.9*  HGB 7.1* 6.6* 7.4*  HCT 23.8* 22.0* 24.5*  MCV 91.5 91.7 90.4  PLT 360 320 325   Blood Culture    Component Value Date/Time   SDES BLOOD LEFT ARM 03/28/2019 0530   SPECREQUEST  03/28/2019 0530    BOTTLES DRAWN AEROBIC AND ANAEROBIC Blood Culture results may not be optimal due to an excessive volume of blood received in culture bottles   CULT  03/28/2019 0530    NO GROWTH 1 DAY Performed at [facility], [place] [address] [place], [place] [address]    REPTSTATUS PENDING 03/28/2019 0530     Medications: . ceFEPime (MAXIPIME) IV 2 g (03/29/19 1648)  . ferric gluconate (FERRLECIT/NULECIT) IV    . vancomycin 1,000 mg (03/29/19 1116)   . sodium chloride   Intravenous Once  . sodium chloride   Intravenous Once  . atorvastatin  40 mg Oral Q2000  . bethanechol  25 mg Oral TID  . calcitRIOL  0.25 mcg Oral Q M,W,F-1800  . Chlorhexidine Gluconate Cloth  6 each Topical Q0600  . Chlorhexidine Gluconate Cloth  6 each Topical Q0600  . citalopram  40 mg Oral Daily  . darbepoetin (ARANESP) injection - DIALYSIS  150 mcg Intravenous Q Wed-HD  . feeding supplement (PRO-STAT SUGAR FREE 64)  30 mL Oral BID  . gabapentin  300 mg Oral QHS  . heparin  5,000 Units Subcutaneous Q8H  . Melatonin  4.5 mg Oral QHS  . metroNIDAZOLE  500 mg Oral Q8H  . multivitamin  1 tablet Oral QHS  . oxyCODONE  5 mg Oral TID  . pantoprazole  40 mg Oral Daily  . polyethylene glycol  17 g Oral Daily  . senna-docusate  1 tablet Oral BID  . tamsulosin  0.4 mg Oral QPC breakfast    Dialysis Orders: [facility] MWF  4 hr EDW 120 400/800 left upper AVF maturing and right IJ TDC heaprin 4K venofer 100 x 2 more doses Micrea 100 q 2 wk last 8/.26 calcitriol 0.25  Recent labs: hgb 7.7 14% sat iPTH 258 Ca/P ok  Assessment/Plan: 1. RLE wounds - prior path from punch bx 8/5 showed necrotic tissue with acute inflammation. Wound care, ortho following. Remains on Vanc/Cefepime/Flagyl. 2. ESRD - Continue HD per MWF schedule - for HD today. 3. Hypertension/volume-  prone to high IDWG/ with anasarca, lowering EDW as tolerated. 4. Anemia- Hgb 7.4 - finishing course ofIV iron and giving Aranesp weekly, follow. FOBT pending. 5. Metabolic bone disease: Corr Ca ok, Phos pending. No calciphylaxis on wound Bx - fine to continue low dose calcitriol for now - but have low threshold to hold.  6. Nutrition- Alb low, continue supplements/MVI. 7. Hx left BKA    8. Urinary retention issues- d/c on flomax and low dose urecholine - w/  instruction to f/u with urology after last d/c  Veneta Penton, PA-C 03/31/2019, 10:28 AM  [place] Pager: [phone]

## 2019-03-31 NOTE — Consult Note (Signed)
Pharmacy Antibiotic Note  Michael Riggs is a 48 y.o. male admitted on 03/28/2019 with LE wound infection.   Continues on Cefepime / Vancomycin  Blood cultures negative to date ESRD - HD on schedule  Plan: Continue  vanc 1000 mg MWF qHD Continue cefepime 2 g MWF qHD  Height: 5\' 10"  (177.8 cm) Weight: 255 lb 8.2 oz (115.9 kg) IBW/kg (Calculated) : 73  Temp (24hrs), Avg:99.1 F (37.3 C), Min:98.4 F (36.9 C), Max:99.5 F (37.5 C)  Recent Labs  Lab 03/28/19 0538 03/28/19 0933 03/28/19 1352 03/28/19 2028 03/28/19 2349 03/29/19 0459 03/30/19 0824 03/31/19 0537  WBC 14.6*  --   --   --   --  14.5*  --  12.0*  CREATININE 9.93*  --   --   --   --  6.03* 4.97* 6.88*  LATICACIDVEN 1.4 2.2*  --  0.9 1.0  --   --   --   VANCORANDOM 7  --  5  --   --   --   --   --     Estimated Creatinine Clearance: 16.9 mL/min (A) (by C-G formula based on SCr of 6.88 mg/dL (H)).    No Known Allergies   Thank you for allowing pharmacy to be a part of this patient's care.  Tad Moore 03/31/2019 10:15 AM

## 2019-04-01 DIAGNOSIS — E785 Hyperlipidemia, unspecified: Secondary | ICD-10-CM

## 2019-04-01 DIAGNOSIS — I10 Essential (primary) hypertension: Secondary | ICD-10-CM

## 2019-04-01 DIAGNOSIS — R197 Diarrhea, unspecified: Secondary | ICD-10-CM

## 2019-04-01 LAB — CBC WITH DIFFERENTIAL/PLATELET
Abs Immature Granulocytes: 0.19 10*3/uL — ABNORMAL HIGH (ref 0.00–0.07)
Basophils Absolute: 0.1 10*3/uL (ref 0.0–0.1)
Basophils Relative: 1 %
Eosinophils Absolute: 1.1 10*3/uL — ABNORMAL HIGH (ref 0.0–0.5)
Eosinophils Relative: 8 %
HCT: 27 % — ABNORMAL LOW (ref 39.0–52.0)
Hemoglobin: 8.1 g/dL — ABNORMAL LOW (ref 13.0–17.0)
Immature Granulocytes: 1 %
Lymphocytes Relative: 15 %
Lymphs Abs: 2.1 10*3/uL (ref 0.7–4.0)
MCH: 27.1 pg (ref 26.0–34.0)
MCHC: 30 g/dL (ref 30.0–36.0)
MCV: 90.3 fL (ref 80.0–100.0)
Monocytes Absolute: 1.6 10*3/uL — ABNORMAL HIGH (ref 0.1–1.0)
Monocytes Relative: 12 %
Neutro Abs: 8.4 10*3/uL — ABNORMAL HIGH (ref 1.7–7.7)
Neutrophils Relative %: 63 %
Platelets: 308 10*3/uL (ref 150–400)
RBC: 2.99 MIL/uL — ABNORMAL LOW (ref 4.22–5.81)
RDW: 15 % (ref 11.5–15.5)
WBC: 13.5 10*3/uL — ABNORMAL HIGH (ref 4.0–10.5)
nRBC: 0 % (ref 0.0–0.2)

## 2019-04-01 LAB — COMPREHENSIVE METABOLIC PANEL
ALT: 12 U/L (ref 0–44)
AST: 17 U/L (ref 15–41)
Albumin: 1.9 g/dL — ABNORMAL LOW (ref 3.5–5.0)
Alkaline Phosphatase: 100 U/L (ref 38–126)
Anion gap: 11 (ref 5–15)
BUN: 25 mg/dL — ABNORMAL HIGH (ref 6–20)
CO2: 24 mmol/L (ref 22–32)
Calcium: 8 mg/dL — ABNORMAL LOW (ref 8.9–10.3)
Chloride: 95 mmol/L — ABNORMAL LOW (ref 98–111)
Creatinine, Ser: 4.87 mg/dL — ABNORMAL HIGH (ref 0.61–1.24)
GFR calc Af Amer: 15 mL/min — ABNORMAL LOW (ref >60)
GFR calc non Af Amer: 13 mL/min — ABNORMAL LOW (ref >60)
Glucose, Bld: 110 mg/dL — ABNORMAL HIGH (ref 70–99)
Potassium: 3.2 mmol/L — ABNORMAL LOW (ref 3.5–5.1)
Sodium: 130 mmol/L — ABNORMAL LOW (ref 135–145)
Total Bilirubin: 0.9 mg/dL (ref 0.3–1.2)
Total Protein: 8.5 g/dL — ABNORMAL HIGH (ref 6.5–8.1)

## 2019-04-01 MED ORDER — SODIUM THIOSULFATE 25 % IV SOLN
25.0000 g | INTRAVENOUS | Status: DC
  Administered 2019-04-03 – 2019-04-26 (×9): 25 g via INTRAVENOUS
  Filled 2019-04-01 (×15): qty 100

## 2019-04-01 MED ORDER — HYDROCORTISONE ACETATE 25 MG RE SUPP
25.0000 mg | Freq: Two times a day (BID) | RECTAL | Status: DC | PRN
  Administered 2019-04-01: 25 mg via RECTAL
  Filled 2019-04-01 (×2): qty 1

## 2019-04-01 NOTE — Progress Notes (Signed)
Pt keeps calling out asking for hemorrhoid cream. RN applied barrier cream to bottom due to patient stating that his butt is sore from having frequent BMs. Pt stated that is helped some but it was sore inside, hurting and burning. Messaged on call to see if we can get an order.   Eleanora Neighbor, RN

## 2019-04-01 NOTE — Progress Notes (Signed)
[facility] Progress Note   Dialysis Orders: [facility] MWF 4 hr EDW 120 400/800  left upper AVF maturing and right IJ TDC heaprin 4K venofer 100 x 2 more doses Micrea 100 q 2 wk last 8/.26 calcitriol 0.25  Recent labs: hgb 7.7 14% sat iPTH 258 Ca/P ok  Assessment/Plan: 1. RLE wounds and left thigh wound - prior path from punch bx 8/5 showed necrotic tissue with acute inflammation; though not deemed calciphylaxis, appearance and degree of pain very similar -seen by ortho -Plastics saw 9/11 - indicates no plan for surgical intervention and recommends care as per wound care RN. given hx of prior BKA - surgical consult for BKA might be more appropriate  wound healing issues- would ask Ortho to reconsult given then prior opinions.  BC pending - low grade temp on Vanc/Maxepime 2. ESRD -  MWF -next HD Monday - K 3.2  3. Hypertension/volume  -  prone to high IDWG/ with anasarca - no BP meds only lasix 80 mg per day - net UF 4 L Friday  10 113.8 post HD - continue to lower volume serially - re-establish EDW at d/c  4. Anemia  - hgb 9.4 transfused 1 unit PRBC 9/9  -d/c on 100 Mircera - last dose 8/26  - ^ Aranesp to 150 - suspect some ESA resistance due to inflammation;  5. Metabolic bone disease -   no calciphylaxis on bx, but wounds similar to that; holding calcitriol for now "just in case"  and iPTH not high but wounds and degree of pain parallel this 6. Nutrition - renal diet/vits/restrict fluids alb low - add prostat 7. Hx left BKA    8. Urinary retention issues - d/c on flomax and low dose urecholine - w/ instruction to f/u with urology after last d/c 9.     Disp - not clear of d/c plans   Myriam Jacobson, PA-C [place] 04/01/2019,9:09 AM  LOS: 4 days   Subjective:   Wounds are painful.  Feels like needles sticking into legs.  Objective Vitals:   03/31/19 1648 03/31/19 1805 03/31/19 1934 04/01/19 0344  BP: (!) 131/49 (!) 114/58 121/73 122/72   Pulse: 79 87 84 77  Resp: 14 18 18 18   Temp: 98.2 F (36.8 C) 99 F (37.2 C) 98.9 F (37.2 C) 99.4 F (37.4 C)  TempSrc: Oral Oral Oral Oral  SpO2: 96% 98% 96% 98%  Weight: 113.8 kg   113.5 kg  Height:       Physical Exam General: sleeping supine in room no O2 rouses easily - breathing easily Heart: RRR Lungs: clear anteriorly  Abdomen: obese with anasarca/dependent flank edema improving + BS Extremities: right LE wounds wrapped  Left BKA no edema some dependent bilateral upper thigh edema Dialysis Access:  Right IJ TDC left upper AVF + bruit   Additional Objective Labs: Basic Metabolic Panel: Recent Labs  Lab 03/30/19 0824 03/31/19 0537 04/01/19 0523  NA 134* 133* 130*  K 3.4* 3.2* 3.2*  CL 98 98 95*  CO2 26 23 24   GLUCOSE 86 92 110*  BUN 24* 39* 25*  CREATININE 4.97* 6.88* 4.87*  CALCIUM 8.4* 8.3* 8.0*   Liver Function Tests: Recent Labs  Lab 03/30/19 0824 03/31/19 0537 04/01/19 0523  AST 16 16 17   ALT 15 14 12   ALKPHOS 91 93 100  BILITOT 1.0 0.9 0.9  PROT 8.1 8.2* 8.5*  ALBUMIN 2.0* 1.9* 1.9*   No results for input(s): LIPASE,  AMYLASE in the last 168 hours. CBC: Recent Labs  Lab 03/28/19 0538 03/29/19 0459 03/31/19 0537 04/01/19 0523  WBC 14.6* 14.5* 12.0* 13.5*  NEUTROABS 10.1*  --  7.9* 8.4*  HGB 7.1* 6.6* 7.4* 8.1*  HCT 23.8* 22.0* 24.5* 27.0*  MCV 91.5 91.7 90.4 90.3  PLT 360 320 325 308   Blood Culture    Component Value Date/Time   SDES BLOOD LEFT ARM 03/28/2019 0530   SPECREQUEST  03/28/2019 0530    BOTTLES DRAWN AEROBIC AND ANAEROBIC Blood Culture results may not be optimal due to an excessive volume of blood received in culture bottles   CULT  03/28/2019 0530    NO GROWTH 4 DAYS Performed at [facility] [address] [place], [place] [address]    REPTSTATUS PENDING 03/28/2019 0530    Cardiac Enzymes: No results for input(s): CKTOTAL, CKMB, CKMBINDEX, TROPONINI in the last 168 hours. CBG: Recent Labs  Lab  03/28/19 2238  GLUCAP 88   Iron Studies: No results for input(s): IRON, TIBC, TRANSFERRIN, FERRITIN in the last 72 hours. Lab Results  Component Value Date   INR 1.3 (H) 03/28/2019   INR 1.4 (H) 01/31/2019   Studies/Results: No results found. Medications: . ceFEPime (MAXIPIME) IV 2 g (03/31/19 1752)  . vancomycin 1,000 mg (03/31/19 2022)   . sodium chloride   Intravenous Once  . sodium chloride   Intravenous Once  . atorvastatin  40 mg Oral Q2000  . bethanechol  25 mg Oral TID  . Chlorhexidine Gluconate Cloth  6 each Topical Q0600  . Chlorhexidine Gluconate Cloth  6 each Topical Q0600  . citalopram  40 mg Oral Daily  . darbepoetin (ARANESP) injection - DIALYSIS  150 mcg Intravenous Q Wed-HD  . feeding supplement (PRO-STAT SUGAR FREE 64)  30 mL Oral BID  . gabapentin  300 mg Oral QHS  . heparin  5,000 Units Subcutaneous Q8H  . Melatonin  4.5 mg Oral QHS  . metroNIDAZOLE  500 mg Oral Q8H  . multivitamin  1 tablet Oral QHS  . pantoprazole  40 mg Oral Daily  . polyethylene glycol  17 g Oral Daily  . senna-docusate  1 tablet Oral BID  . tamsulosin  0.4 mg Oral QPC breakfast

## 2019-04-02 DIAGNOSIS — R5381 Other malaise: Secondary | ICD-10-CM

## 2019-04-02 DIAGNOSIS — M79604 Pain in right leg: Secondary | ICD-10-CM

## 2019-04-02 LAB — CBC WITH DIFFERENTIAL/PLATELET
Abs Immature Granulocytes: 0.4 10*3/uL — ABNORMAL HIGH (ref 0.00–0.07)
Basophils Absolute: 0.1 10*3/uL (ref 0.0–0.1)
Basophils Relative: 1 %
Eosinophils Absolute: 1 10*3/uL — ABNORMAL HIGH (ref 0.0–0.5)
Eosinophils Relative: 6 %
HCT: 28.4 % — ABNORMAL LOW (ref 39.0–52.0)
Hemoglobin: 8.4 g/dL — ABNORMAL LOW (ref 13.0–17.0)
Immature Granulocytes: 2 %
Lymphocytes Relative: 12 %
Lymphs Abs: 2 10*3/uL (ref 0.7–4.0)
MCH: 26.8 pg (ref 26.0–34.0)
MCHC: 29.6 g/dL — ABNORMAL LOW (ref 30.0–36.0)
MCV: 90.7 fL (ref 80.0–100.0)
Monocytes Absolute: 1.7 10*3/uL — ABNORMAL HIGH (ref 0.1–1.0)
Monocytes Relative: 10 %
Neutro Abs: 11.2 10*3/uL — ABNORMAL HIGH (ref 1.7–7.7)
Neutrophils Relative %: 69 %
Platelets: 350 10*3/uL (ref 150–400)
RBC: 3.13 MIL/uL — ABNORMAL LOW (ref 4.22–5.81)
RDW: 15.1 % (ref 11.5–15.5)
WBC: 16.4 10*3/uL — ABNORMAL HIGH (ref 4.0–10.5)
nRBC: 0 % (ref 0.0–0.2)

## 2019-04-02 LAB — CULTURE, BLOOD (ROUTINE X 2)
Culture: NO GROWTH
Culture: NO GROWTH
Special Requests: ADEQUATE

## 2019-04-02 LAB — C DIFFICILE QUICK SCREEN W PCR REFLEX
C Diff antigen: NEGATIVE
C Diff interpretation: NOT DETECTED
C Diff toxin: NEGATIVE

## 2019-04-02 MED ORDER — CHLORHEXIDINE GLUCONATE CLOTH 2 % EX PADS
6.0000 | MEDICATED_PAD | Freq: Every day | CUTANEOUS | Status: DC
  Administered 2019-04-04: 6 via TOPICAL

## 2019-04-02 MED ORDER — LOPERAMIDE HCL 2 MG PO CAPS
2.0000 mg | ORAL_CAPSULE | ORAL | Status: DC | PRN
  Administered 2019-04-02 – 2019-04-22 (×17): 2 mg via ORAL
  Filled 2019-04-02 (×17): qty 1

## 2019-04-02 NOTE — Progress Notes (Signed)
Changed dressing to right leg per orders.

## 2019-04-02 NOTE — Progress Notes (Signed)
[facility] Progress Note   Dialysis Orders: [facility] MWF 4 hr EDW 120 400/800  left upper AVF maturing and right IJ TDC heaprin 4K venofer 100 x 2 more doses Micrea 100 q 2 wk last 8/.26 calcitriol 0.25  Recent labs: hgb 7.7 14% sat iPTH 258 Ca/P ok  Assessment/Plan: 1. RLE wounds and left thigh wound - prior path from punch bx 8/5 showed necrotic tissue with acute inflammation; though not deemed calciphylaxis, appearance and degree of pain very similar -seen by ortho -Plastics saw 9/11 - indicates no plan for surgical intervention and recommends care as per wound care RN. given hx of prior BKA - surgical consult for BKA might be more appropriate  wound healing issues- would ask Ortho to reconsult given then prior opinions.  BC no growth  on Vanc/Maxepime/flagyl; based on appearance of wounds and severity of pain, will empirically begina Na thiosulfate MWF with HD to limit further calcific uremic arteriopathy by avoiding calcitriol/Ca containing binders  2. ESRD -  MWF -next HD Monday - K 3.2 9/12- start on 3 K bath 3. Hypertension/volume  -  prone to high IDWG/ with anasarca - no BP meds only lasix 80 mg per day - net UF 4 L Friday  10 113.8 post HD - continue to lower volume serially - re-establish EDW at d/c  4. Anemia  - hgb 9.4 transfused 1 unit PRBC 9/9  -d/c on 100 Mircera - last dose 8/26  - ^ Aranesp to 150 q Wed - suspect some ESA resistance due to inflammation; hgb 8.4  9/13 5. Metabolic bone disease -   no calciphylaxis on bx, but wounds similar to that; holding calcitriol for now iPTH not high but wound exam  and degree of pain mimic calciphylaxis- see above plans 6. Nutrition - renal diet/vits/restrict fluids alb low - add prostat 7. Hx left BKA    8. Urinary retention issues - d/c on flomax and low dose urecholine - w/ instruction to f/u with urology after last d/c 9.     Disp - not clear of d/c plans - return to SNF vs home  Michael Jacobson, PA-C [place] [phone] 04/02/2019,8:21 AM  LOS: 5 days   Subjective:   Wounds are painful. Bottoms hurts from frequent BMs- nursing addressing - says not diarrhea - just soft  - customer complaints today.   Objective Vitals:   04/01/19 0344 04/01/19 0900 04/01/19 2021 04/02/19 0630  BP: 122/72 128/76 107/77 120/78  Pulse: 77 78 82 80  Resp: 18 18 18 18   Temp: 99.4 F (37.4 C) 98 F (36.7 C) 98.8 F (37.1 C) 98.8 F (37.1 C)  TempSrc: Oral Oral Oral Oral  SpO2: 98% 99% 95% 99%  Weight: 113.5 kg     Height:       Physical Exam General: alert and talkative Heart: RRR Lungs: clear anteriorly  Abdomen: obese with anasarca/dependent flank edema improving + BS Extremities: right LE wounds wrapped  Left BKA no edema - wounds on left thigh hard and with eschar.  some dependent bilateral upper thigh edema Dialysis Access:  Right IJ TDC left upper AVF + bruit placed 6/22   Additional Objective Labs: Basic Metabolic Panel: Recent Labs  Lab 03/30/19 0824 03/31/19 0537 04/01/19 0523  NA 134* 133* 130*  K 3.4* 3.2* 3.2*  CL 98 98 95*  CO2 26 23 24   GLUCOSE 86 92 110*  BUN 24* 39* 25*  CREATININE 4.97* 6.88* 4.87*  CALCIUM 8.4* 8.3* 8.0*   Liver Function Tests: Recent Labs  Lab 03/30/19 0824 03/31/19 0537 04/01/19 0523  AST 16 16 17   ALT 15 14 12   ALKPHOS 91 93 100  BILITOT 1.0 0.9 0.9  PROT 8.1 8.2* 8.5*  ALBUMIN 2.0* 1.9* 1.9*   No results for input(s): LIPASE, AMYLASE in the last 168 hours. CBC: Recent Labs  Lab 03/28/19 0538 03/29/19 0459 03/31/19 0537 04/01/19 0523 04/02/19 0401  WBC 14.6* 14.5* 12.0* 13.5* 16.4*  NEUTROABS 10.1*  --  7.9* 8.4* 11.2*  HGB 7.1* 6.6* 7.4* 8.1* 8.4*  HCT 23.8* 22.0* 24.5* 27.0* 28.4*  MCV 91.5 91.7 90.4 90.3 90.7  PLT 360 320 325 308 350   Blood Culture    Component Value Date/Time   SDES BLOOD LEFT ARM 03/28/2019 0530   SPECREQUEST  03/28/2019 0530    BOTTLES DRAWN AEROBIC AND ANAEROBIC Blood Culture results  may not be optimal due to an excessive volume of blood received in culture bottles   CULT  03/28/2019 0530    NO GROWTH 5 DAYS Performed at [facility], [place] [address], [place], [place] [address]    REPTSTATUS 04/02/2019 FINAL 03/28/2019 0530    Cardiac Enzymes: No results for input(s): CKTOTAL, CKMB, CKMBINDEX, TROPONINI in the last 168 hours. CBG: Recent Labs  Lab 03/28/19 2238  GLUCAP 88   Iron Studies: No results for input(s): IRON, TIBC, TRANSFERRIN, FERRITIN in the last 72 hours. Lab Results  Component Value Date   INR 1.3 (H) 03/28/2019   INR 1.4 (H) 01/31/2019   Studies/Results: No results found. Medications: . ceFEPime (MAXIPIME) IV 2 g (03/31/19 1752)  . [START ON 04/03/2019] sodium thiosulfate infusion for calciphylaxis    . vancomycin 1,000 mg (03/31/19 2022)   . sodium chloride   Intravenous Once  . sodium chloride   Intravenous Once  . atorvastatin  40 mg Oral Q2000  . bethanechol  25 mg Oral TID  . Chlorhexidine Gluconate Cloth  6 each Topical Q0600  . Chlorhexidine Gluconate Cloth  6 each Topical Q0600  . citalopram  40 mg Oral Daily  . darbepoetin (ARANESP) injection - DIALYSIS  150 mcg Intravenous Q Wed-HD  . feeding supplement (PRO-STAT SUGAR FREE 64)  30 mL Oral BID  . gabapentin  300 mg Oral QHS  . heparin  5,000 Units Subcutaneous Q8H  . Melatonin  4.5 mg Oral QHS  . metroNIDAZOLE  500 mg Oral Q8H  . multivitamin  1 tablet Oral QHS  . pantoprazole  40 mg Oral Daily  . tamsulosin  0.4 mg Oral QPC breakfast

## 2019-04-02 NOTE — Progress Notes (Addendum)
PROGRESS NOTE    Michael Riggs  [name]  DOB: 1971/01/03  DOA: 03/28/2019 PCP: [name], MD  Brief Narrative:  48 y.o.malewith medical history significant ofHTN; HLD; and ESRD on TTS HD presented to ED from SNF with fever, confusion,worsening right leg pain cough/SOB and noted to have R leg infection.  Patient has had a complicated clinical course since July. He was admitted from 7/13-17 for progressive renal dysfunction, new ESRD with uremia-induced metabolic encephalopathy. He had TDC placed on 7/14 and started on HD. He was discharged to home on 7/17 and returned to the ER and was admitted from 7/18-22 for anasarca and volume overload and was discharged to CIR on 7/22 because of generalized deconditioning with ambulatory dysfunction. He was noted to have multiple skin tears and ulceration of the RLE and received wound care while inpatient.At the time of d/c from CIR on 8/25, he had limited assistance at home, hence  discharged to SNF. He went to [facility] on 8/25. His wounds reportedly were healing reasonably well at that time.  Upon arrival to the ED, he was febrile 101.1, and hypoxic with oxygen saturation of 85% with EMS requiring 2 to 3 L of oxygen. He had RLE drainage with necrotic tissue, no apparent cellulitis and no gas on xray. No apparent deep space infection. COVID negative.  Patient was admitted for sepsis likely secondary to right lower extremity wound infection.  Chest x-ray was normal.  Patient was started on Rocephin, Flagyl and vancomycin.  He was evaluated by orthopedics in the ED who had said that patient does not seem to need emergent surgical intervention and consulted plastic surgeon to be on board however no plastic surgeon has seen yet. Dr Sharol Given on vacation, antibiotics tapered , ?been spiking temp with persistent pain limiting discharge.Leukocytosis stable.  Lactic acidosis resolved.  Blood culture negative.  Subjective: Patient continues to  c/o severe pain in RLE 10/10 and also reports persistent diarrhea albeit improved from yesterday. Seen by Plastic surgery yesterday  Objective: Vitals:   04/01/19 0900 04/01/19 2021 04/02/19 0630 04/02/19 0905  BP: 128/76 107/77 120/78 123/80  Pulse: 78 82 80 72  Resp: 18 18 18 18   Temp: 98 F (36.7 C) 98.8 F (37.1 C) 98.8 F (37.1 C) 98.6 F (37 C)  TempSrc: Oral Oral Oral Oral  SpO2: 99% 95% 99% 99%  Weight:      Height:        Intake/Output Summary (Last 24 hours) at 04/02/2019 1337 Last data filed at 04/02/2019 1231 Gross per 24 hour  Intake 1080 ml  Output 0 ml  Net 1080 ml   Filed Weights   03/31/19 1240 03/31/19 1648 04/01/19 0344  Weight: 117.6 kg 113.8 kg 113.5 kg    Physical Examination:  General exam: Appears calm and comfortable  Respiratory system: Clear to auscultation. Respiratory effort normal. Cardiovascular system: S1 & S2 heard, RRR. No JVD, murmurs, rubs, gallops or clicks. Gastrointestinal system: Abdomen is nondistended, soft and nontender. No organomegaly or masses felt. Normal bowel sounds heard. Central nervous system: Alert and oriented. No focal neurological deficits. Extremities:Right lower extremity has diffuse eschar of most of the right leg.  He also has some eschar on the medial side of left leg.  He has dressing in the right lower extremity now. S/P left BKA. Psychiatry: Judgement and insight appear normal. Mood & affect anxious     Data Reviewed: I have personally reviewed following labs and imaging studies  CBC: Recent Labs  Lab 03/28/19 0538 03/29/19 0459 03/31/19 0537 04/01/19 0523 04/02/19 0401  WBC 14.6* 14.5* 12.0* 13.5* 16.4*  NEUTROABS 10.1*  --  7.9* 8.4* 11.2*  HGB 7.1* 6.6* 7.4* 8.1* 8.4*  HCT 23.8* 22.0* 24.5* 27.0* 28.4*  MCV 91.5 91.7 90.4 90.3 90.7  PLT 360 320 325 308 [id number]   Basic Metabolic Panel: Recent Labs  Lab 03/28/19 0538 03/29/19 0459 03/30/19 0824 03/31/19 0537 04/01/19 0523  NA 135 134* 134*  133* 130*  K 4.1 3.9 3.4* 3.2* 3.2*  CL 95* 99 98 98 95*  CO2 21* 23 26 23 24   GLUCOSE 78 112* 86 92 110*  BUN 74* 37* 24* 39* 25*  CREATININE 9.93* 6.03* 4.97* 6.88* 4.87*  CALCIUM 8.5* 7.9* 8.4* 8.3* 8.0*   GFR: Estimated Creatinine Clearance: 23.7 mL/min (A) (by C-G formula based on SCr of 4.87 mg/dL (H)). Liver Function Tests: Recent Labs  Lab 03/28/19 0538 03/30/19 0824 03/31/19 0537 04/01/19 0523  AST 15 16 16 17   ALT 23 15 14 12   ALKPHOS 125 91 93 100  BILITOT 1.0 1.0 0.9 0.9  PROT 9.2* 8.1 8.2* 8.5*  ALBUMIN 2.3* 2.0* 1.9* 1.9*   No results for input(s): LIPASE, AMYLASE in the last 168 hours. No results for input(s): AMMONIA in the last 168 hours. Coagulation Profile: Recent Labs  Lab 03/28/19 0538  INR 1.3*   Cardiac Enzymes: No results for input(s): CKTOTAL, CKMB, CKMBINDEX, TROPONINI in the last 168 hours. BNP (last 3 results) No results for input(s): PROBNP in the last 8760 hours. HbA1C: No results for input(s): HGBA1C in the last 72 hours. CBG: Recent Labs  Lab 03/28/19 2238  GLUCAP 88   Lipid Profile: No results for input(s): CHOL, HDL, LDLCALC, TRIG, CHOLHDL, LDLDIRECT in the last 72 hours. Thyroid Function Tests: No results for input(s): TSH, T4TOTAL, FREET4, T3FREE, THYROIDAB in the last 72 hours. Anemia Panel: No results for input(s): VITAMINB12, FOLATE, FERRITIN, TIBC, IRON, RETICCTPCT in the last 72 hours. Sepsis Labs: Recent Labs  Lab 03/28/19 0538 03/28/19 0933 03/28/19 2028 03/28/19 2349  LATICACIDVEN 1.4 2.2* 0.9 1.0    Recent Results (from the past 240 hour(s))  Culture, blood (Routine x 2)     Status: None   Collection Time: 03/28/19  5:20 AM   Specimen: BLOOD LEFT ARM  Result Value Ref Range Status   Specimen Description BLOOD LEFT ARM  Final   Special Requests   Final    BOTTLES DRAWN AEROBIC AND ANAEROBIC Blood Culture adequate volume   Culture   Final    NO GROWTH 5 DAYS Performed at [facility], [address] [place], [place] [address]    Report Status 04/02/2019 FINAL  Final  Culture, blood (Routine x 2)     Status: None   Collection Time: 03/28/19  5:30 AM   Specimen: BLOOD LEFT ARM  Result Value Ref Range Status   Specimen Description BLOOD LEFT ARM  Final   Special Requests   Final    BOTTLES DRAWN AEROBIC AND ANAEROBIC Blood Culture results may not be optimal due to an excessive volume of blood received in culture bottles   Culture   Final    NO GROWTH 5 DAYS Performed at [facility], [place] [address] [place],  [address]    Report Status 04/02/2019 FINAL  Final  SARS Coronavirus 2 [facility] order, Performed in [facility] hospital lab) Nasopharyngeal Nasopharyngeal Swab     Status: None   Collection Time:  03/28/19  6:05 AM   Specimen: Nasopharyngeal Swab  Result Value Ref Range Status   SARS Coronavirus 2 NEGATIVE NEGATIVE Final    Comment: (NOTE) If result is NEGATIVE SARS-CoV-2 target nucleic acids are NOT DETECTED. The SARS-CoV-2 RNA is generally detectable in upper and lower  respiratory specimens during the acute phase of infection. The lowest  concentration of SARS-CoV-2 viral copies this assay can detect is 250  copies / mL. A negative result does not preclude SARS-CoV-2 infection  and should not be used as the sole basis for treatment or other  patient management decisions.  A negative result may occur with  improper specimen collection / handling, submission of specimen other  than nasopharyngeal swab, presence of viral mutation(s) within the  areas targeted by this assay, and inadequate number of viral copies  (<250 copies / mL). A negative result must be combined with clinical  observations, patient history, and epidemiological information. If result is POSITIVE SARS-CoV-2 target nucleic acids are DETECTED. The SARS-CoV-2 RNA is generally detectable in upper and lower  respiratory specimens dur ing the acute phase of infection.  Positive  results are  indicative of active infection with SARS-CoV-2.  Clinical  correlation with patient history and other diagnostic information is  necessary to determine patient infection status.  Positive results do  not rule out bacterial infection or co-infection with other viruses. If result is PRESUMPTIVE POSTIVE SARS-CoV-2 nucleic acids MAY BE PRESENT.   A presumptive positive result was obtained on the submitted specimen  and confirmed on repeat testing.  While 2019 novel coronavirus  (SARS-CoV-2) nucleic acids may be present in the submitted sample  additional confirmatory testing may be necessary for epidemiological  and / or clinical management purposes  to differentiate between  SARS-CoV-2 and other Sarbecovirus currently known to infect humans.  If clinically indicated additional testing with an alternate test  methodology [phone]) is advised. The SARS-CoV-2 RNA is generally  detectable in upper and lower respiratory sp ecimens during the acute  phase of infection. The expected result is Negative. Fact Sheet for Patients:  StrictlyIdeas.no Fact Sheet for Healthcare Providers: BankingDealers.co.za This test is not yet approved or cleared by the [place] FDA and has been authorized for detection and/or diagnosis of SARS-CoV-2 by FDA under an Emergency Use Authorization (EUA).  This EUA will remain in effect (meaning this test can be used) for the duration of the COVID-19 declaration under Section 564(b)(1) of the Act, 21 U.S.C. section 360bbb-3(b)(1), unless the authorization is terminated or revoked sooner. Performed at [facility], [place] [address] [place], [place] [address]   C difficile quick scan w PCR reflex     Status: None   Collection Time: 04/02/19  6:35 AM   Specimen: STOOL  Result Value Ref Range Status   C Diff antigen NEGATIVE NEGATIVE Final   C Diff toxin NEGATIVE NEGATIVE Final   C Diff interpretation No C.  difficile detected.  Final    Comment: Performed at [facility], [place] [address], [place], [place] [address]      Radiology Studies: No results found.      Scheduled Meds: . sodium chloride   Intravenous Once  . sodium chloride   Intravenous Once  . atorvastatin  40 mg Oral Q2000  . bethanechol  25 mg Oral TID  . Chlorhexidine Gluconate Cloth  6 each Topical Q0600  . citalopram  40 mg Oral Daily  . darbepoetin (ARANESP) injection - DIALYSIS  150  mcg Intravenous Q Wed-HD  . feeding supplement (PRO-STAT SUGAR FREE 64)  30 mL Oral BID  . gabapentin  300 mg Oral QHS  . heparin  5,000 Units Subcutaneous Q8H  . Melatonin  4.5 mg Oral QHS  . multivitamin  1 tablet Oral QHS  . pantoprazole  40 mg Oral Daily  . tamsulosin  0.4 mg Oral QPC breakfast   Continuous Infusions: . ceFEPime (MAXIPIME) IV 2 g (03/31/19 1752)  . [START ON 04/03/2019] sodium thiosulfate infusion for calciphylaxis      Assessment & Plan:    1.Sepsis likely secondary to right lower extremity wound infection/cellulitis: Patient had temp spike of 101.94F on 9/9. Afebrile since then. He is still complaining of severe right lower extremity pain.  Seen by Nephrology who raise the possibility of calciphylaxis given severe pain and clinical appearance. Patient was seen by Ortho Dr Stann Mainland on 9/8 who felt there was minimal cellulitis if any around the chronic eschars. He recommended Plastic surgery eval, continued local wound care and abx for mild infection. CBC shows persistent leukocytosis which could be inflammatory as well.  Lactic acidosis resolved.Blood culture negative.Continue cefepime and will d/c vancomycin/Flagyl. Appreciate plastic surgery evaluation who do not recommend any intervention from their stand pont. Accordingly, per last Ortho note and per nephrology suggestion, Hospitalist team will consult Dr Sharol Given in am (returns from vacation tomorrow) for further recommendations.    2.ESRD: On MWF HD schedule.   Nephrology consulted.  Management per them.  3. Severe Right Lower extremity pain: Nephrology suspects calciphylaxis. States Neurontin does not help.Oxycodone was changed to PRN use on 9/11.Will add long acting scheduled meds for better pain control. Nephrology will empirically begina Na thiosulfate MWF with HD to limit further calcific uremic arteriopathy.  4. Diarrhea: Discontinued laxatives (MiraLAX and Colace) yesterday and monitoring. Patient reports 2 small loose stools today. Check C. Difficile, if -ve, will add loperamide.  5. Recent syphilis: [facility] in early July.  6.  Essential hypertension: Controlled.  Continue Norvasc.  7. Hyperlipidemia: Continue Lipitor  DVT prophylaxis: Heparin Code Status: Full code Family / Patient Communication: Discussed with patient and bedside nurse Disposition Plan: Back to SNF when medically cleared. Will repeat COVID screening in case he is cleared for discharge in next 72 hours.     LOS: 5 days    Time spent: [place]    Guilford Shi, MD [facility] Pager [phone]  If 7PM-7AM, please contact night-coverage [email or web address] Password [facility] 04/02/2019, 1:37 PM

## 2019-04-02 NOTE — TOC Initial Note (Signed)
Transition of Care (TOC) - Initial/Assessment Note    Patient Details  Name: Michael Riggs MRN: [id number] Date of Birth: 08/17/1970  Transition of Care (TOC) CM/SW Contact:     B , LCSWA Phone Number: 04/02/2019, 10:45 AM  Clinical Narrative:              CSW met with the patient at bedside. CSW introduced herself and explained her role. CSW asked if the patient was agreeable to returning to [facility] once he is medically stable. The patient stated that he was agreeable. CSW asked if he had any other questions, he stated no.   CSW will continue to follow and assist with disposition planning.        Expected Discharge Plan: Skilled Nursing Facility Barriers to Discharge: Continued Medical Work up   Patient Goals and CMS Choice Patient states their goals for this hospitalization and ongoing recovery are:: Pt is agreeable to return back to [facility] once he is medically stable. CMS Medicare.gov Compare Post Acute Care list provided to:: (Pt to return back to his SNF) Choice offered to / list presented to : NA  Expected Discharge Plan and Services Expected Discharge Plan: Skilled Nursing Facility In-house Referral: Clinical Social Work Discharge Planning Services: NA Post Acute Care Choice: Skilled Nursing Facility Living arrangements for the past 2 months: Skilled Nursing Facility                 DME Arranged: N/A DME Agency: NA       HH Arranged: NA HH Agency: NA        Prior Living Arrangements/Services Living arrangements for the past 2 months: Skilled Nursing Facility Lives with:: Facility Resident Patient language and need for interpreter reviewed:: No Do you feel safe going back to the place where you live?: Yes      Need for Family Participation in Patient Care: No (Comment) Care giver support system in place?: Yes (comment)   Criminal Activity/Legal Involvement Pertinent to Current Situation/Hospitalization: No - Comment  as needed  Activities of Daily Living      Permission Sought/Granted Permission sought to share information with : Facility Contact Representative Permission granted to share information with : Yes, Verbal Permission Granted  Share Information with NAME: Maria  Permission granted to share info w AGENCY: [facility]  Permission granted to share info w Relationship: Mother     Emotional Assessment Appearance:: Appears stated age Attitude/Demeanor/Rapport: Engaged Affect (typically observed): Calm Orientation: : Oriented to Self, Oriented to Place, Oriented to  Time, Oriented to Situation Alcohol / Substance Use: Not Applicable Psych Involvement: No (comment)  Admission diagnosis:  Hypoxia [R09.02] SIRS (systemic inflammatory response syndrome) (HCC) [R65.10] Febrile illness [R50.9] Patient Active Problem List   Diagnosis Date Noted  . Wound infection 03/28/2019  . Hypertension   . Dyslipidemia   . Diabetes mellitus with peripheral vascular disease (HCC)   . Depression   . Physical deconditioning   . Pruritus   . Scrotal pain   . Labile blood pressure   . Scrotal edema   . Status post below-knee amputation of left lower extremity (HCC)   . Slow transit constipation   . Leukocytosis   . Sleep disturbance   . Essential hypertension   . Anemia of chronic disease   . Controlled type 2 diabetes mellitus with hyperglycemia, without long-term current use of insulin (HCC)   . ESRD on dialysis (HCC)   . Urinary retention   . Debility 02/09/2019  .   Fluid overload 02/04/2019  . Hyperkalemia 01/30/2019  . ARF (acute renal failure) ([place]) 01/30/2019  . Hypokalemia 01/30/2019  . Hypoglycemia 01/30/2019  . Syphilis 01/30/2019  . Macrocytic anemia 01/30/2019  . End stage renal disease ([place])   . ESRD (end stage renal disease) on dialysis ([place]) 01/2019  . PDR (proliferative diabetic retinopathy) ([place]) 12/14/2013  . Venous hypertension 09/22/2013  . Diabetes mellitus  type 2 in obese ([place]) 07/04/2013  . Habitual alcohol use 07/04/2013  . Obesity, Class II, BMI 35-39.9, with comorbidity 07/04/2013   PCP:  [name], MD Pharmacy:  No Pharmacies Listed    Social Determinants of Health (SDOH) Interventions    Readmission Risk Interventions No flowsheet data found.

## 2019-04-02 NOTE — NC FL2 (Signed)
[place] LEVEL OF CARE SCREENING TOOL     IDENTIFICATION  Patient Name: Michael Riggs Birthdate: 04-25-1971 Sex: male Admission Date (Current Location): 03/28/2019  [place] and [place] Number:  [place]val Linsey [id number] [place] and Address:  [facility], [place] [address] [place], [place] [address]      Provider Number: [id number]  Attending Physician Name and Address:  Guilford Shi, MD  Relative Name and Phone Number:  Nikoloz Kurowski, Mother, [place], Brother, [phone]    Current Level of Care: Hospital Recommended Level of Care: [place] Prior Approval Number:    Date Approved/Denied:   PASRR Number: [id number]  Discharge Plan: SNF    Current Diagnoses: Patient Active Problem List   Diagnosis Date Noted  . Wound infection 03/28/2019  . Hypertension   . Dyslipidemia   . Diabetes mellitus with peripheral vascular disease ([place])   . Depression   . Physical deconditioning   . Pruritus   . Scrotal pain   . Labile blood pressure   . Scrotal edema   . Status post below-knee amputation of left lower extremity ([place])   . Slow transit constipation   . Leukocytosis   . Sleep disturbance   . Essential hypertension   . Anemia of chronic disease   . Controlled type 2 diabetes mellitus with hyperglycemia, without long-term current use of insulin ([place])   . ESRD on dialysis ([place])   . Urinary retention   . Debility 02/09/2019  . Fluid overload 02/04/2019  . Hyperkalemia 01/30/2019  . ARF (acute renal failure) ([place]) 01/30/2019  . Hypokalemia 01/30/2019  . Hypoglycemia 01/30/2019  . Syphilis 01/30/2019  . Macrocytic anemia 01/30/2019  . End stage renal disease ([place])   . ESRD (end stage renal disease) on dialysis ([place]) 01/2019  . PDR (proliferative diabetic retinopathy) ([place]) 12/14/2013  . Venous hypertension 09/22/2013  . Diabetes mellitus type 2 in obese ([place]) 07/04/2013  . Habitual  alcohol use 07/04/2013  . Obesity, Class II, BMI 35-39.9, with comorbidity 07/04/2013    Orientation RESPIRATION BLADDER Height & Weight     Self, Time, Situation, Place  Normal Incontinent Weight: 250 lb 3.6 oz (113.5 kg) Height:  5\' 10"  (177.8 cm)  BEHAVIORAL SYMPTOMS/MOOD NEUROLOGICAL BOWEL NUTRITION STATUS      Incontinent Diet(Renal diet, thin liquids, fluid restriciton 1224ml)  AMBULATORY STATUS COMMUNICATION OF NEEDS Skin   Limited Assist Verbally Skin abrasions(abrasion on leg/hand)                       Personal Care Assistance Level of Assistance  Bathing, Feeding, Dressing, Total care Bathing Assistance: Limited assistance Feeding assistance: Independent Dressing Assistance: Limited assistance Total Care Assistance: Limited assistance   Functional Limitations Info  Sight, Hearing, Speech Sight Info: Adequate Hearing Info: Adequate Speech Info: Adequate    SPECIAL CARE FACTORS FREQUENCY                       Contractures Contractures Info: Not present    Additional Factors Info  Code Status, Allergies, Psychotropic Code Status Info: Full Code Allergies Info: No Known Allergies Psychotropic Info: celexa tablet 40mg  daily         Current Medications (04/02/2019):  This is the current hospital active medication list Current Facility-Administered Medications  Medication Dose Route Frequency Provider Last Rate Last Dose  . 0.9 %  sodium chloride infusion (Manually program via Guardrails IV Fluids)   Intravenous Once  Schorr, Rhetta Mura, NP      . 0.9 %  sodium chloride infusion (Manually program via Guardrails IV Fluids)   Intravenous Once Schorr, Rhetta Mura, NP      . acetaminophen (TYLENOL) tablet 650 mg  650 mg Oral Q6H PRN Karmen Bongo, MD   650 mg at 03/31/19 2023   Or  . acetaminophen (TYLENOL) suppository 650 mg  650 mg Rectal Q6H PRN Karmen Bongo, MD      . albuterol (PROVENTIL) (2.5 MG/3ML) 0.083% nebulizer solution 2.5 mg  2.5 mg  Nebulization Q6H PRN Steenwyk, Yujing Z, RPH      . atorvastatin (LIPITOR) tablet 40 mg  40 mg Oral Q2000 Karmen Bongo, MD   40 mg at 04/01/19 2238  . bethanechol (URECHOLINE) tablet 25 mg  25 mg Oral TID Karmen Bongo, MD   25 mg at 04/02/19 [id number]  . bisacodyl (DULCOLAX) suppository 10 mg  10 mg Rectal Daily PRN Karmen Bongo, MD      . camphor-menthol [facility]) lotion 1 application  1 application Topical [id number] PRN Karmen Bongo, MD       And  . hydrOXYzine (ATARAX/VISTARIL) tablet 25 mg  25 mg Oral Q8H PRN Karmen Bongo, MD   25 mg at 03/31/19 2026  . ceFEPIme (MAXIPIME) 2 g in sodium chloride 0.9 % 100 mL IVPB  2 g Intravenous Q M,W,F-1800 Rolla Flatten, RPH 200 mL/hr at 03/31/19 1752 2 g at 03/31/19 1752  . Chlorhexidine Gluconate Cloth 2 % PADS 6 each  6 each Topical Q0600 Alric Seton, PA-C      . citalopram (CELEXA) tablet 40 mg  40 mg Oral Daily Karmen Bongo, MD   40 mg at 04/02/19 [id number]  . Darbepoetin Alfa (ARANESP) injection 150 mcg  150 mcg Intravenous Q Wed-HD Alric Seton, PA-C   150 mcg at 03/29/19 0906  . docusate sodium (ENEMEEZ) enema 283 mg  1 enema Rectal PRN Karmen Bongo, MD      . feeding supplement (NEPRO CARB STEADY) liquid 237 mL  237 mL Oral TID PRN Karmen Bongo, MD      . feeding supplement (PRO-STAT SUGAR FREE 64) liquid 30 mL  30 mL Oral BID Alric Seton, PA-C   30 mL at 04/02/19 0812  . gabapentin (NEURONTIN) capsule 300 mg  300 mg Oral QHS Karmen Bongo, MD   300 mg at 04/01/19 2238  . heparin injection 5,000 Units  5,000 Units Subcutaneous Lynne Logan, MD   5,000 Units at 04/02/19 [id number]  . hydrocortisone (ANUSOL-HC) suppository 25 mg  25 mg Rectal BID PRN Lovey Newcomer T, NP   25 mg at 04/01/19 2306  . Melatonin TABS 4.5 mg  4.5 mg Oral QHS Karmen Bongo, MD   4.5 mg at 04/01/19 2238  . methocarbamol (ROBAXIN) tablet 500 mg  500 mg Oral Q8H PRN Karmen Bongo, MD   500 mg at 03/31/19 1537  . metroNIDAZOLE (FLAGYL) tablet 500  mg  500 mg Oral Q8H Darliss Cheney, MD   500 mg at 04/02/19 [id number]  . multivitamin (RENA-VIT) tablet 1 tablet  1 tablet Oral QHS Alric Seton, PA-C   1 tablet at 04/01/19 2238  . ondansetron (ZOFRAN) tablet 4 mg  4 mg Oral Q6H PRN Karmen Bongo, MD       Or  . ondansetron [facility]) injection 4 mg  4 mg Intravenous Q6H PRN Karmen Bongo, MD      . oxyCODONE (Oxy IR/ROXICODONE) immediate release tablet 10 mg  10 mg Oral  Q8H PRN Darliss Cheney, MD   10 mg at 04/02/19 0406  . pantoprazole (PROTONIX) EC tablet 40 mg  40 mg Oral Daily Karmen Bongo, MD   40 mg at 04/02/19 [id number]  . 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 sorbitol 70 % solution 30 mL  30 mL Oral PRN Karmen Bongo, MD      . tamsulosin [facility]) capsule 0.4 mg  0.4 mg Oral QPC breakfast Karmen Bongo, MD   0.4 mg at 04/02/19 [id number]  . vancomycin (VANCOCIN) IVPB 1000 mg/200 mL premix  1,000 mg Intravenous Q M,W,F-HD Karren Cobble, RPH 200 mL/hr at 03/31/19 2022 1,000 mg at 03/31/19 2022  . zolpidem (AMBIEN) tablet 5 mg  5 mg Oral QHS PRN Karmen Bongo, MD         Discharge Medications: Please see discharge summary for a list of discharge medications.  Relevant Imaging Results:  Relevant Lab Results:   Additional Information SSN: [id number]; HD schedule M, W, F  Violett Hobbs B Dhillon [place], LCSWA

## 2019-04-03 LAB — RENAL FUNCTION PANEL
Albumin: 1.8 g/dL — ABNORMAL LOW (ref 3.5–5.0)
Anion gap: 14 (ref 5–15)
BUN: 57 mg/dL — ABNORMAL HIGH (ref 6–20)
CO2: 23 mmol/L (ref 22–32)
Calcium: 8.2 mg/dL — ABNORMAL LOW (ref 8.9–10.3)
Chloride: 93 mmol/L — ABNORMAL LOW (ref 98–111)
Creatinine, Ser: 8.38 mg/dL — ABNORMAL HIGH (ref 0.61–1.24)
GFR calc Af Amer: 8 mL/min — ABNORMAL LOW (ref >60)
GFR calc non Af Amer: 7 mL/min — ABNORMAL LOW (ref >60)
Glucose, Bld: 153 mg/dL — ABNORMAL HIGH (ref 70–99)
Phosphorus: 6.4 mg/dL — ABNORMAL HIGH (ref 2.5–4.6)
Potassium: 3.3 mmol/L — ABNORMAL LOW (ref 3.5–5.1)
Sodium: 130 mmol/L — ABNORMAL LOW (ref 135–145)

## 2019-04-03 LAB — CBC WITH DIFFERENTIAL/PLATELET
Abs Immature Granulocytes: 0.46 10*3/uL — ABNORMAL HIGH (ref 0.00–0.07)
Basophils Absolute: 0.1 10*3/uL (ref 0.0–0.1)
Basophils Relative: 1 %
Eosinophils Absolute: 1 10*3/uL — ABNORMAL HIGH (ref 0.0–0.5)
Eosinophils Relative: 6 %
HCT: 27.5 % — ABNORMAL LOW (ref 39.0–52.0)
Hemoglobin: 8.2 g/dL — ABNORMAL LOW (ref 13.0–17.0)
Immature Granulocytes: 3 %
Lymphocytes Relative: 12 %
Lymphs Abs: 2.1 10*3/uL (ref 0.7–4.0)
MCH: 27.1 pg (ref 26.0–34.0)
MCHC: 29.8 g/dL — ABNORMAL LOW (ref 30.0–36.0)
MCV: 90.8 fL (ref 80.0–100.0)
Monocytes Absolute: 1.8 10*3/uL — ABNORMAL HIGH (ref 0.1–1.0)
Monocytes Relative: 10 %
Neutro Abs: 12.6 10*3/uL — ABNORMAL HIGH (ref 1.7–7.7)
Neutrophils Relative %: 68 %
Platelets: 342 10*3/uL (ref 150–400)
RBC: 3.03 MIL/uL — ABNORMAL LOW (ref 4.22–5.81)
RDW: 15.1 % (ref 11.5–15.5)
WBC: 18.1 10*3/uL — ABNORMAL HIGH (ref 4.0–10.5)
nRBC: 0.1 % (ref 0.0–0.2)

## 2019-04-03 LAB — NOVEL CORONAVIRUS, NAA (HOSP ORDER, SEND-OUT TO REF LAB; TAT 18-24 HRS): SARS-CoV-2, NAA: NOT DETECTED

## 2019-04-03 LAB — GLUCOSE, CAPILLARY
Glucose-Capillary: 153 mg/dL — ABNORMAL HIGH (ref 70–99)
Glucose-Capillary: 138 mg/dL — ABNORMAL HIGH (ref 70–99)

## 2019-04-03 MED ORDER — PENTAFLUOROPROP-TETRAFLUOROETH EX AERO: 1.0000 "application " | INHALATION_SPRAY | CUTANEOUS | Status: DC | PRN

## 2019-04-03 MED ORDER — SODIUM CHLORIDE 0.9 % IV SOLN: 100.0000 mL | INTRAVENOUS | Status: DC | PRN

## 2019-04-03 MED ORDER — LIDOCAINE HCL (PF) 1 % IJ SOLN: 5.0000 mL | INTRAMUSCULAR | Status: DC | PRN

## 2019-04-03 MED ORDER — LIDOCAINE-PRILOCAINE 2.5-2.5 % EX CREA: 1.0000 "application " | TOPICAL_CREAM | CUTANEOUS | Status: DC | PRN

## 2019-04-03 MED ORDER — HEPARIN SODIUM (PORCINE) 1000 UNIT/ML DIALYSIS
1000.0000 [IU] | INTRAMUSCULAR | Status: DC | PRN
  Administered 2019-04-03: 1000 [IU] via INTRAVENOUS_CENTRAL

## 2019-04-03 MED ORDER — HEPARIN SODIUM (PORCINE) 1000 UNIT/ML IJ SOLN
INTRAMUSCULAR | Status: AC
  Administered 2019-04-03: 1000 [IU]
  Filled 2019-04-03: qty 4

## 2019-04-03 MED ORDER — OXYCODONE HCL 5 MG PO TABS
ORAL_TABLET | ORAL | Status: AC
  Filled 2019-04-03: qty 2

## 2019-04-03 MED ORDER — ALTEPLASE 2 MG IJ SOLR: 2.0000 mg | Freq: Once | INTRAMUSCULAR | Status: DC | PRN

## 2019-04-03 NOTE — Progress Notes (Signed)
RN found pt lying in bed with spit all around mouth. RN called pt and he responded. Pt's voice sounded a little slurred. Pt is alert and oriented. Vitals are WNL. Pt states that he is fine, however he is clammy, shaking, and he has a bad cough. Pt will cough and seem like he is about to throw up. CBG is in 150s. Pt has been awake all night as well, so he is very sleepy at this time. On call was notified to make aware. RN has made HD aware. Will continue to monitor.    Eleanora Neighbor, RN

## 2019-04-03 NOTE — Progress Notes (Signed)
PROGRESS NOTE    Michael Riggs  [name] DOB: 1970-09-18 DOA: 03/28/2019 PCP: [name], MD   Brief Narrative:  48 year old with history of ESRD on TTS HD, hypertension hyperlipidemia sent from SNF for evaluation of fever, confusion right lower extremity pain concerning for infection.  Within last 2 months he had been admitting in the hospital for worsening of renal function and fluid overload.  Started on dialysis and eventually sent to rehab.  This time admitted for sepsis secondary to right lower extremity cellulitis with concerns for necrotic tissue.  Orthopedic and plastic surgery consulted who recommended medical management and have Dr. Sharol Given follow-up once he returns from his vacation.   Assessment & Plan:   Principal Problem:   Wound infection Active Problems:   Hypertension   ESRD (end stage renal disease) on dialysis (HCC)   Dyslipidemia   Diabetes mellitus with peripheral vascular disease (HCC)   Depression   Physical deconditioning   Sepsis secondary to right lower extremity cellulitis with some pruritus Chronic Eschar - Does have chronic shortness with low-grade infection -Continue cefepime, discontinued vancomycin and Flagyl -Seen by orthopedic and plastic surgery -We will consult Dr. Sharol Given as previously recommended by orthopedic.  He will see the patient tomorrow morning -Pain control  ESRD on hemodialysis -Nephrology team following.  Essential hypertension - Appears to be on Lasix at home otherwise diet controlled?  Hyperlipidemia - On Lipitor 40 mg daily  History of recent syphilis treated with penicillin  Diabetes mellitus type 2 Peripheral neuropathy -Recent hemoglobin A1c 5.0. -Insulin sliding scale.  Continue gabapentin  DVT prophylaxis: Subcutaneous heparin Code Status: Full code Family Communication: None at bedside Disposition Plan: Maintain in hospital stay for further evaluation of right lower extremity cellulitis  Consultants:    Plastic surgery  Orthopedic  Procedures:   None  Antimicrobials:   Cefepime   Subjective: Reporting of right lower extremity pain especially with movement. Seen and examined on dialysis.  Review of Systems Otherwise negative except as per HPI, including: General: Denies fever, chills, night sweats or unintended weight loss. Resp: Denies cough, wheezing, shortness of breath. Cardiac: Denies chest pain, palpitations, orthopnea, paroxysmal nocturnal dyspnea. GI: Denies abdominal pain, nausea, vomiting, diarrhea or constipation GU: Denies dysuria, frequency, hesitancy or incontinence MS: Denies muscle aches, joint pain or swelling Neuro: Denies headache, neurologic deficits (focal weakness, numbness, tingling), abnormal gait Psych: Denies anxiety, depression, SI/HI/AVH Skin: Denies new rashes or lesions ID: Denies sick contacts, exotic exposures, travel  Objective: Vitals:   04/03/19 1200 04/03/19 1230 04/03/19 1250 04/03/19 1325  BP: 112/68 (!) 107/56 101/66 116/62  Pulse: 60 99 84 82  Resp:   18 18  Temp:   97.9 F (36.6 C) 98.2 F (36.8 C)  TempSrc:   Oral Oral  SpO2:   96% 92%  Weight:   110.9 kg   Height:        Intake/Output Summary (Last 24 hours) at 04/03/2019 1327 Last data filed at 04/03/2019 1250 Gross per 24 hour  Intake 360 ml  Output 3900 ml  Net -3540 ml   Filed Weights   04/01/19 0344 04/03/19 0820 04/03/19 1250  Weight: 113.5 kg 114.7 kg 110.9 kg    Examination:  General exam: Appears calm and comfortable  Respiratory system: Clear to auscultation. Respiratory effort normal. Cardiovascular system: S1 & S2 heard, RRR. No JVD, murmurs, rubs, gallops or clicks. No pedal edema. Gastrointestinal system: Abdomen is nondistended, soft and nontender. No organomegaly or masses felt. Normal bowel sounds heard.  Central nervous system: Alert and oriented. No focal neurological deficits. Extremities: Left-sided BKA noted.  Right lower extremity  dressing in place slightly tender to palpation. Skin: No rashes, lesions or ulcers Psychiatry: Judgement and insight appear normal. Mood & affect appropriate.   Hemodialysis catheter noted in his right neck Left upper extremity AVF  Data Reviewed:   CBC: Recent Labs  Lab 03/28/19 0538 03/29/19 0459 03/31/19 0537 04/01/19 0523 04/02/19 0401 04/03/19 0447  WBC 14.6* 14.5* 12.0* 13.5* 16.4* 18.1*  NEUTROABS 10.1*  --  7.9* 8.4* 11.2* 12.6*  HGB 7.1* 6.6* 7.4* 8.1* 8.4* 8.2*  HCT 23.8* 22.0* 24.5* 27.0* 28.4* 27.5*  MCV 91.5 91.7 90.4 90.3 90.7 90.8  PLT 360 320 325 308 350 [id number]   Basic Metabolic Panel: Recent Labs  Lab 03/29/19 0459 03/30/19 0824 03/31/19 0537 04/01/19 0523 04/03/19 0934  NA 134* 134* 133* 130* 130*  K 3.9 3.4* 3.2* 3.2* 3.3*  CL 99 98 98 95* 93*  CO2 23 26 23 24 23   GLUCOSE 112* 86 92 110* 153*  BUN 37* 24* 39* 25* 57*  CREATININE 6.03* 4.97* 6.88* 4.87* 8.38*  CALCIUM 7.9* 8.4* 8.3* 8.0* 8.2*  PHOS  --   --   --   --  6.4*   GFR: Estimated Creatinine Clearance: 13.6 mL/min (A) (by C-G formula based on SCr of 8.38 mg/dL (H)). Liver Function Tests: Recent Labs  Lab 03/28/19 0538 03/30/19 0824 03/31/19 0537 04/01/19 0523 04/03/19 0934  AST 15 16 16 17   --   ALT 23 15 14 12   --   ALKPHOS 125 91 93 100  --   BILITOT 1.0 1.0 0.9 0.9  --   PROT 9.2* 8.1 8.2* 8.5*  --   ALBUMIN 2.3* 2.0* 1.9* 1.9* 1.8*   No results for input(s): LIPASE, AMYLASE in the last 168 hours. No results for input(s): AMMONIA in the last 168 hours. Coagulation Profile: Recent Labs  Lab 03/28/19 0538  INR 1.3*   Cardiac Enzymes: No results for input(s): CKTOTAL, CKMB, CKMBINDEX, TROPONINI in the last 168 hours. BNP (last 3 results) No results for input(s): PROBNP in the last 8760 hours. HbA1C: No results for input(s): HGBA1C in the last 72 hours. CBG: Recent Labs  Lab 03/28/19 2238 04/03/19 0623 04/03/19 0839  GLUCAP 88 153* 138*   Lipid Profile: No  results for input(s): CHOL, HDL, LDLCALC, TRIG, CHOLHDL, LDLDIRECT in the last 72 hours. Thyroid Function Tests: No results for input(s): TSH, T4TOTAL, FREET4, T3FREE, THYROIDAB in the last 72 hours. Anemia Panel: No results for input(s): VITAMINB12, FOLATE, FERRITIN, TIBC, IRON, RETICCTPCT in the last 72 hours. Sepsis Labs: Recent Labs  Lab 03/28/19 0538 03/28/19 0933 03/28/19 2028 03/28/19 2349  LATICACIDVEN 1.4 2.2* 0.9 1.0    Recent Results (from the past 240 hour(s))  Culture, blood (Routine x 2)     Status: None   Collection Time: 03/28/19  5:20 AM   Specimen: BLOOD LEFT ARM  Result Value Ref Range Status   Specimen Description BLOOD LEFT ARM  Final   Special Requests   Final    BOTTLES DRAWN AEROBIC AND ANAEROBIC Blood Culture adequate volume   Culture   Final    NO GROWTH 5 DAYS Performed at [facility], [address], [place], [place] [address]    Report Status 04/02/2019 FINAL  Final  Culture, blood (Routine x 2)     Status: None   Collection Time: 03/28/19  5:30 AM   Specimen: BLOOD  LEFT ARM  Result Value Ref Range Status   Specimen Description BLOOD LEFT ARM  Final   Special Requests   Final    BOTTLES DRAWN AEROBIC AND ANAEROBIC Blood Culture results may not be optimal due to an excessive volume of blood received in culture bottles   Culture   Final    NO GROWTH 5 DAYS Performed at [facility], [place] [address] [place], [place] [address]    Report Status 04/02/2019 FINAL  Final  SARS Coronavirus 2 [facility] order, Performed in [facility] hospital lab) Nasopharyngeal Nasopharyngeal Swab     Status: None   Collection Time: 03/28/19  6:05 AM   Specimen: Nasopharyngeal Swab  Result Value Ref Range Status   SARS Coronavirus 2 NEGATIVE NEGATIVE Final    Comment: (NOTE) If result is NEGATIVE SARS-CoV-2 target nucleic acids are NOT DETECTED. The SARS-CoV-2 RNA is generally detectable in upper and lower  respiratory specimens during the acute phase  of infection. The lowest  concentration of SARS-CoV-2 viral copies this assay can detect is 250  copies / mL. A negative result does not preclude SARS-CoV-2 infection  and should not be used as the sole basis for treatment or other  patient management decisions.  A negative result may occur with  improper specimen collection / handling, submission of specimen other  than nasopharyngeal swab, presence of viral mutation(s) within the  areas targeted by this assay, and inadequate number of viral copies  (<250 copies / mL). A negative result must be combined with clinical  observations, patient history, and epidemiological information. If result is POSITIVE SARS-CoV-2 target nucleic acids are DETECTED. The SARS-CoV-2 RNA is generally detectable in upper and lower  respiratory specimens dur ing the acute phase of infection.  Positive  results are indicative of active infection with SARS-CoV-2.  Clinical  correlation with patient history and other diagnostic information is  necessary to determine patient infection status.  Positive results do  not rule out bacterial infection or co-infection with other viruses. If result is PRESUMPTIVE POSTIVE SARS-CoV-2 nucleic acids MAY BE PRESENT.   A presumptive positive result was obtained on the submitted specimen  and confirmed on repeat testing.  While 2019 novel coronavirus  (SARS-CoV-2) nucleic acids may be present in the submitted sample  additional confirmatory testing may be necessary for epidemiological  and / or clinical management purposes  to differentiate between  SARS-CoV-2 and other Sarbecovirus currently known to infect humans.  If clinically indicated additional testing with an alternate test  methodology [phone]) is advised. The SARS-CoV-2 RNA is generally  detectable in upper and lower respiratory sp ecimens during the acute  phase of infection. The expected result is Negative. Fact Sheet for Patients:   StrictlyIdeas.no Fact Sheet for Healthcare Providers: BankingDealers.co.za This test is not yet approved or cleared by the [place] FDA and has been authorized for detection and/or diagnosis of SARS-CoV-2 by FDA under an Emergency Use Authorization (EUA).  This EUA will remain in effect (meaning this test can be used) for the duration of the COVID-19 declaration under Section 564(b)(1) of the Act, 21 U.S.C. section 360bbb-3(b)(1), unless the authorization is terminated or revoked sooner. Performed at [facility], [place] [address] [place], [place] [address]   C difficile quick scan w PCR reflex     Status: None   Collection Time: 04/02/19  6:35 AM   Specimen: STOOL  Result Value Ref Range Status   C Diff antigen NEGATIVE NEGATIVE Final  C Diff toxin NEGATIVE NEGATIVE Final   C Diff interpretation No C. difficile detected.  Final    Comment: Performed at [facility], [place] [address], [place], [place] [address]         Radiology Studies: No results found.      Scheduled Meds: . sodium chloride   Intravenous Once  . sodium chloride   Intravenous Once  . atorvastatin  40 mg Oral Q2000  . bethanechol  25 mg Oral TID  . Chlorhexidine Gluconate Cloth  6 each Topical Q0600  . citalopram  40 mg Oral Daily  . darbepoetin (ARANESP) injection - DIALYSIS  150 mcg Intravenous Q Wed-HD  . feeding supplement (PRO-STAT SUGAR FREE 64)  30 mL Oral BID  . gabapentin  300 mg Oral QHS  . heparin  5,000 Units Subcutaneous Q8H  . Melatonin  4.5 mg Oral QHS  . multivitamin  1 tablet Oral QHS  . oxyCODONE      . pantoprazole  40 mg Oral Daily  . tamsulosin  0.4 mg Oral QPC breakfast   Continuous Infusions: . ceFEPime (MAXIPIME) IV 2 g (03/31/19 1752)  . sodium thiosulfate infusion for calciphylaxis 25 g (04/03/19 1133)     LOS: 6 days   Time spent= 35 mins    Thadeus Gandolfi Arsenio Loader, MD [facility]  If 7PM-7AM,  please contact night-coverage [email or web address] 04/03/2019, 1:27 PM

## 2019-04-03 NOTE — Plan of Care (Signed)
  Problem: Health Behavior/Discharge Planning: Goal: Ability to manage health-related needs will improve Outcome: Progressing   Problem: Clinical Measurements: Goal: Diagnostic test results will improve Outcome: Progressing   Problem: Elimination: Goal: Will not experience complications related to bowel motility Outcome: Progressing   

## 2019-04-03 NOTE — Progress Notes (Addendum)
Subjective:  Seen in Hd, Co Right Lower Extremity discomfort  Controlled with meds , tolerating UF  On HD  Now   Objective Vital signs in last 24 hours: Vitals:   04/03/19 1200 04/03/19 1230 04/03/19 1250 04/03/19 1325  BP: 112/68 (!) 107/56 101/66 116/62  Pulse: 60 99 84 82  Resp:   18 18  Temp:   97.9 F (36.6 C) 98.2 F (36.8 C)  TempSrc:   Oral Oral  SpO2:   96% 92%  Weight:   110.9 kg   Height:       Weight change:   Physical Exam: General: Alert,  on HD Chronically ill appearing Male, currently NAD   Heart: RRR no rub Lungs: CTA  Anter. , non labored Abdomen: obese, soft, NT, ND  Withsome  abd wall edema  Extremities: R LE Wounds wrapped dressing clean /dry/ Left  BKA  No stump edema , Left inner thigh hard dark Eschar wound . Some bilat thigh edema  Dialysis Access: R IJ Perm cath  Patent on HD / LUA AVF  + bruit     Dialysis Orders: [facility] MWF 4 hr EDW 120 400/800 left upper AVF maturing and right IJ TDC heaprin 4K venofer 100 x 2 more doses Micrea 100 q 2 wk last 8/.26 calcitriol 0.25  Recent labs: hgb 7.7 14% sat iPTH 258 Ca/P ok  Problem/Plan: 1. RLE wounds and left thigh wound - prior path from punch bx 8/5 showed necrotic tissue with acute inflammation. Seen by ortho -Plastics saw 9/11 - indicates no plan for surgical intervention and recommends care as per wound care RN .  BC no growth  on Vanc/Maxepime/flagyl; based on appearance of wounds and severity of pain, will empirically begin Na thiosulfate MWF with HD to limit further calcific uremic arteriopathy by avoiding calcitriol/Ca containing binders. 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 Will start Na thio, avoid all Ca/ Vit D products, use low Ca (2.0) bath and hope that these wounds heal.  Antibiotics generally speaking aren't usually needed for this unless there is an obvious secondary infection.  2. ESRD- MWF -next HD Monday - K 3.3 3. Hypertension/volume-   prone to high IDWG/ with anasarca - no BP meds only lasix 80 mg per day ( LASIX Dc IN ESRD pt with Vol ^  Not helping ) - net UF 4 L Friday  10 113.8 post HD - continue to lower volume serially - re-establish EDW at d/c  4. Anemia- hgb 8.2 was  transfused 1 unit PRBC 9/9  -d/c on 100 Mircera - last dose 8/26 - ^ Aranesp to 150 q Wed - suspect some ESA resistance due to inflammation 5. Metabolic bone disease-  no calciphylaxis on bx, but wounds similar to that; holding calcitriol for now iPTH not high but wound exam  and degree of pain mimic calciphylaxis- see above plans 6. Nutrition- renal diet/vits/restrict fluids alb low - add prostat 7. Hx left BKA    8.Urinary retention issues- d/c on flomax and low dose urecholine - w/ instruction to f/u with urology after last d/c 9.     Disp  - return to SNF vs home  Ernest Haber, PA-C [place] [phone] 04/03/2019,2:19 PM  LOS: 6 days   Pt seen, examined and agree w assess/plan as above with additions as indicated.  [facility] 04/03/2019, 3:30 PM     Labs: Basic Metabolic Panel: Recent Labs  Lab 03/31/19 0537 04/01/19 0523 04/03/19 0934  NA 133* 130* 130*  K 3.2* 3.2* 3.3*  CL 98 95* 93*  CO2 23 24 23   GLUCOSE 92 110* 153*  BUN 39* 25* 57*  CREATININE 6.88* 4.87* 8.38*  CALCIUM 8.3* 8.0* 8.2*  PHOS  --   --  6.4*   Liver Function Tests: Recent Labs  Lab 03/30/19 0824 03/31/19 0537 04/01/19 0523 04/03/19 0934  AST 16 16 17   --   ALT 15 14 12   --   ALKPHOS 91 93 100  --   BILITOT 1.0 0.9 0.9  --   PROT 8.1 8.2* 8.5*  --   ALBUMIN 2.0* 1.9* 1.9* 1.8*   No results for input(s): LIPASE, AMYLASE in the last 168 hours. No results for input(s): AMMONIA in the last 168 hours. CBC: Recent Labs  Lab 03/29/19 0459 03/31/19 0537 04/01/19 0523 04/02/19 0401 04/03/19 0447  WBC 14.5* 12.0* 13.5* 16.4* 18.1*  NEUTROABS  --  7.9* 8.4* 11.2* 12.6*  HGB 6.6* 7.4* 8.1* 8.4*  8.2*  HCT 22.0* 24.5* 27.0* 28.4* 27.5*  MCV 91.7 90.4 90.3 90.7 90.8  PLT 320 325 308 350 342   Cardiac Enzymes: No results for input(s): CKTOTAL, CKMB, CKMBINDEX, TROPONINI in the last 168 hours. CBG: Recent Labs  Lab 03/28/19 2238 04/03/19 0623 04/03/19 0839  GLUCAP 88 153* 138*    Studies/Results: No results found. Medications: . ceFEPime (MAXIPIME) IV 2 g (03/31/19 1752)  . sodium thiosulfate infusion for calciphylaxis 25 g (04/03/19 1133)   . sodium chloride   Intravenous Once  . sodium chloride   Intravenous Once  . atorvastatin  40 mg Oral Q2000  . bethanechol  25 mg Oral TID  . Chlorhexidine Gluconate Cloth  6 each Topical Q0600  . citalopram  40 mg Oral Daily  . darbepoetin (ARANESP) injection - DIALYSIS  150 mcg Intravenous Q Wed-HD  . feeding supplement (PRO-STAT SUGAR FREE 64)  30 mL Oral BID  . gabapentin  300 mg Oral QHS  . heparin  5,000 Units Subcutaneous Q8H  . Melatonin  4.5 mg Oral QHS  . multivitamin  1 tablet Oral QHS  . oxyCODONE      . pantoprazole  40 mg Oral Daily  . tamsulosin  0.4 mg Oral QPC breakfast

## 2019-04-03 NOTE — Consult Note (Signed)
Pharmacy Antibiotic Note  Michael Riggs is a 48 y.o. male admitted on 03/28/2019 with LE wound infection.   Metronidazole / Vancomycin stopped  Plan: Continue cefepime 2 g MWF qHD Awaiting re-evaluation of leg wounds Pharmacy to sign off  Height: 5\' 10"  (177.8 cm) Weight: 244 lb 7.8 oz (110.9 kg) IBW/kg (Calculated) : 73  Temp (24hrs), Avg:98.2 F (36.8 C), Min:97.9 F (36.6 C), Max:98.8 F (37.1 C)  Recent Labs  Lab 03/28/19 0538 03/28/19 0933 03/28/19 1352 03/28/19 2028 03/28/19 2349 03/29/19 0459 03/30/19 0824 03/31/19 0537 04/01/19 0523 04/02/19 0401 04/03/19 0447 04/03/19 0934  WBC 14.6*  --   --   --   --  14.5*  --  12.0* 13.5* 16.4* 18.1*  --   CREATININE 9.93*  --   --   --   --  6.03* 4.97* 6.88* 4.87*  --   --  8.38*  LATICACIDVEN 1.4 2.2*  --  0.9 1.0  --   --   --   --   --   --   --   VANCORANDOM 7  --  5  --   --   --   --   --   --   --   --   --     Estimated Creatinine Clearance: 13.6 mL/min (A) (by C-G formula based on SCr of 8.38 mg/dL (H)).    No Known Allergies   Thank you for allowing pharmacy to be a part of this patient's care.  Tad Moore 04/03/2019 1:57 PM

## 2019-04-03 NOTE — Progress Notes (Signed)
Spoke with floor Rn Stated patient was stable for HD. Pt was up all night and was sleeping hard.

## 2019-04-04 ENCOUNTER — Other Ambulatory Visit: Payer: Self-pay | Admitting: Family

## 2019-04-04 DIAGNOSIS — Z992 Dependence on renal dialysis: Secondary | ICD-10-CM

## 2019-04-04 DIAGNOSIS — E1151 Type 2 diabetes mellitus with diabetic peripheral angiopathy without gangrene: Secondary | ICD-10-CM

## 2019-04-04 DIAGNOSIS — T148XXA Other injury of unspecified body region, initial encounter: Secondary | ICD-10-CM

## 2019-04-04 DIAGNOSIS — N186 End stage renal disease: Secondary | ICD-10-CM

## 2019-04-04 DIAGNOSIS — L089 Local infection of the skin and subcutaneous tissue, unspecified: Secondary | ICD-10-CM

## 2019-04-04 LAB — CBC WITH DIFFERENTIAL/PLATELET
Abs Immature Granulocytes: 0.85 10*3/uL — ABNORMAL HIGH (ref 0.00–0.07)
Basophils Absolute: 0.1 10*3/uL (ref 0.0–0.1)
Basophils Relative: 1 %
Eosinophils Absolute: 0.9 10*3/uL — ABNORMAL HIGH (ref 0.0–0.5)
Eosinophils Relative: 5 %
HCT: 26 % — ABNORMAL LOW (ref 39.0–52.0)
Hemoglobin: 8.2 g/dL — ABNORMAL LOW (ref 13.0–17.0)
Immature Granulocytes: 4 %
Lymphocytes Relative: 16 %
Lymphs Abs: 3.3 10*3/uL (ref 0.7–4.0)
MCH: 27.8 pg (ref 26.0–34.0)
MCHC: 31.5 g/dL (ref 30.0–36.0)
MCV: 88.1 fL (ref 80.0–100.0)
Monocytes Absolute: 2.3 10*3/uL — ABNORMAL HIGH (ref 0.1–1.0)
Monocytes Relative: 11 %
Neutro Abs: 12.9 10*3/uL — ABNORMAL HIGH (ref 1.7–7.7)
Neutrophils Relative %: 63 %
Platelets: 361 10*3/uL (ref 150–400)
RBC: 2.95 MIL/uL — ABNORMAL LOW (ref 4.22–5.81)
RDW: 15.6 % — ABNORMAL HIGH (ref 11.5–15.5)
WBC: 20.3 10*3/uL — ABNORMAL HIGH (ref 4.0–10.5)
nRBC: 0 % (ref 0.0–0.2)

## 2019-04-04 LAB — BASIC METABOLIC PANEL
Anion gap: 20 — ABNORMAL HIGH (ref 5–15)
BUN: 37 mg/dL — ABNORMAL HIGH (ref 6–20)
CO2: 21 mmol/L — ABNORMAL LOW (ref 22–32)
Calcium: 8.7 mg/dL — ABNORMAL LOW (ref 8.9–10.3)
Chloride: 94 mmol/L — ABNORMAL LOW (ref 98–111)
Creatinine, Ser: 5.98 mg/dL — ABNORMAL HIGH (ref 0.61–1.24)
GFR calc Af Amer: 12 mL/min — ABNORMAL LOW (ref >60)
GFR calc non Af Amer: 10 mL/min — ABNORMAL LOW (ref >60)
Glucose, Bld: 125 mg/dL — ABNORMAL HIGH (ref 70–99)
Potassium: 3.2 mmol/L — ABNORMAL LOW (ref 3.5–5.1)
Sodium: 135 mmol/L (ref 135–145)

## 2019-04-04 LAB — MAGNESIUM: Magnesium: 1.8 mg/dL (ref 1.7–2.4)

## 2019-04-04 MED ORDER — CHLORHEXIDINE GLUCONATE CLOTH 2 % EX PADS
6.0000 | MEDICATED_PAD | Freq: Every day | CUTANEOUS | Status: DC
  Administered 2019-04-04 – 2019-04-08 (×5): 6 via TOPICAL

## 2019-04-04 MED ORDER — FENTANYL 50 MCG/HR TD PT72
1.0000 | MEDICATED_PATCH | TRANSDERMAL | Status: DC
  Administered 2019-04-04 – 2019-04-10 (×3): 1 via TRANSDERMAL
  Filled 2019-04-04 (×4): qty 1

## 2019-04-04 MED ORDER — NEPRO/CARBSTEADY PO LIQD
237.0000 mL | ORAL | Status: DC
  Administered 2019-04-04 – 2019-04-10 (×5): 237 mL via ORAL

## 2019-04-04 MED ORDER — POTASSIUM CHLORIDE CRYS ER 20 MEQ PO TBCR
20.0000 meq | EXTENDED_RELEASE_TABLET | Freq: Once | ORAL | Status: AC
  Administered 2019-04-04: 20 meq via ORAL
  Filled 2019-04-04: qty 1

## 2019-04-04 NOTE — Plan of Care (Signed)
  Problem: Education: Goal: Knowledge of General Education information will improve Description Including pain rating scale, medication(s)/side effects and non-pharmacologic comfort measures Outcome: Progressing   

## 2019-04-04 NOTE — TOC Progression Note (Signed)
Transition of Care [facility]) - Progression Note    Patient Details  Name: Cristen Lebeau MRN: [id number] Date of Birth: 08-28-1970  Transition of Care [name]) CM/SW Contact  Sharlet Salina Mila Homer, LCSW Phone Number: 04/04/2019, 11:53 AM  Clinical Narrative:  CSW talked with the patient at bedside regarding his return to SNF vs home and contact with family. Patient still plans to return to [facility] if he needs to and he expressed awareness that he is not medically ready for discharge at this time. Mr. Beatley gave CSW permission to talk with his sister-in-law Arrie Aran [phone]), even though he commented that he "can't stand her".   Call made [facility] and she was informed that patient not medically stable for discharge at this time. Dawn explained that they are working on getting  POA and this was discussed with patient  and agreed upon by him per [employer] CSW advised that she and her husband (patient's brother) moved into the mother's home to help take care of her and they are still paying bills for their own home.     Expected Discharge Plan: [place] Barriers to Discharge: Continued Medical Work up  Expected Discharge Plan and Services Expected Discharge Plan: [place] In-house Referral: Clinical Social Work Discharge Planning Services: NA Post Acute Care Choice: [place] Living arrangements for the past 2 months: [place]                 DME Arranged: N/A DME Agency: NA       HH Arranged: NA HH Agency: NA         Social Determinants of Health (SDOH) Interventions  No SDOH interventions needed at this time  Readmission Risk Interventions No flowsheet data found.

## 2019-04-04 NOTE — Consult Note (Signed)
ORTHOPAEDIC CONSULTATION  REQUESTING PHYSICIAN: Damita Lack, MD  Chief Complaint: Necrotic painful ulceration right leg.  HPI: Michael Riggs is a 48 y.o. male who presents with type 2 diabetes end-stage renal disease on dialysis who states that he has had prolonged ulcerations on the right leg he states there is been weeping draining blisters.  He states that he did not notice this himself but it was noticed by family members several weeks ago.  Past Medical History:  Diagnosis Date  . Anemia   . Depression   . Diabetes mellitus with peripheral vascular disease ([place])   . Dyslipidemia   . Dyspnea   . ESRD (end stage renal disease) on dialysis ([place]) 01/2019   MWF  . GERD (gastroesophageal reflux disease)   . Hypertension   . Physical deconditioning   . Syphilis 01/2019   Past Surgical History:  Procedure Laterality Date  . AV FISTULA PLACEMENT Left 01/09/2019   Procedure: ARTERIOVENOUS (AV) FISTULA CREATION LEFT UPPER ARM;  Surgeon: [name], MD;  Location: [facility] OR;  Service: Vascular;  Laterality: Left;  . below the knee amputation Left   . EYE SURGERY    . IR FLUORO GUIDE CV LINE RIGHT  01/31/2019  . IR FLUORO GUIDE CV LINE RIGHT  02/03/2019  . IR US GUIDE VASC ACCESS RIGHT  01/31/2019  . IR US GUIDE VASC ACCESS RIGHT  02/03/2019   Social History   Socioeconomic History  . Marital status: Divorced    Spouse name: Not on file  . Number of children: Not on file  . Years of education: Not on file  . Highest education level: Not on file  Occupational History  . Not on file  Social Needs  . Financial resource strain: Not on file  . Food insecurity    Worry: Not on file    Inability: Not on file  . Transportation needs    Medical: Not on file    Non-medical: Not on file  Tobacco Use  . Smoking status: Former Smoker    Types: Cigarettes    Start date: 11/18/2018    Quit date: 01/18/2019    Years since quitting: 0.2  . Smokeless tobacco: Never Used  .  Tobacco comment: pt stated got bored with it  Substance and Sexual Activity  . Alcohol use: Not Currently    Comment: heavy drinker in the past, none since 07/19/18  . Drug use: Not Currently  . Sexual activity: Not Currently  Lifestyle  . Physical activity    Days per week: Not on file    Minutes per session: Not on file  . Stress: Not on file  Relationships  . Social Herbalist on phone: Not on file    Gets together: Not on file    Attends religious service: Not on file    Active member of club or organization: Not on file    Attends meetings of clubs or organizations: Not on file    Relationship status: Not on file  Other Topics Concern  . Not on file  Social History Narrative  . Not on file   No family history on file. - negative except otherwise stated in the family history section No Known Allergies Prior to Admission medications   Medication Sig Start Date End Date Taking? Authorizing Provider  acetaminophen (TYLENOL) 325 MG tablet Take 2 tablets (650 mg total) by mouth every 6 (six) hours as needed for mild pain (or Fever >/=  101). 03/13/19  Yes Angiulli, Lavon Paganini, PA-C  albuterol (VENTOLIN HFA) 108 (90 Base) MCG/ACT inhaler Inhale 2 puffs into the lungs every 6 (six) hours as needed for wheezing or shortness of breath.   Yes [provider]  atorvastatin (LIPITOR) 40 MG tablet Take 1 tablet (40 mg total) by mouth daily. 03/13/19  Yes Angiulli, Lavon Paganini, PA-C  bethanechol (URECHOLINE) 25 MG tablet Take 1 tablet (25 mg total) by mouth 3 (three) times daily. 03/13/19  Yes Angiulli, Lavon Paganini, PA-C  bisacodyl (DULCOLAX) 5 MG EC tablet Take 10 mg by mouth daily.   Yes [provider]  citalopram (CELEXA) 40 MG tablet Take 40 mg by mouth daily.  12/13/16  Yes [provider]  furosemide (LASIX) 80 MG tablet Take 1 tablet (80 mg total) by mouth daily. 03/13/19  Yes Angiulli, Lavon Paganini, PA-C  gabapentin (NEURONTIN) 300 MG capsule Take 1 capsule (300  mg total) by mouth at bedtime. Patient taking differently: Take 200 mg by mouth 3 (three) times daily.  02/08/19  Yes Alma Friendly, MD  Lidocaine-Prilocaine &Lido HCl 2.5-2.5 & 3.88 % KIT Apply 1 application topically every Monday, Wednesday, and Friday. Apply to fistula and wrap with saran wrap site prior to dialysys   Yes [provider]  Melatonin 3 MG TABS Take 1.5 tablets (4.5 mg total) by mouth at bedtime. 02/08/19  Yes Alma Friendly, MD  methocarbamol (ROBAXIN) 500 MG tablet Take 1 tablet (500 mg total) by mouth every 8 (eight) hours as needed for muscle spasms. 03/13/19  Yes Angiulli, Lavon Paganini, PA-C  oxyCODONE (OXY IR/ROXICODONE) 5 MG immediate release tablet Take 5 mg by mouth 3 (three) times daily. Take an additional 5 mg every 8 hours as needed.   Yes [provider]  pantoprazole (PROTONIX) 40 MG tablet Take 1 tablet (40 mg total) by mouth daily. 03/13/19  Yes Angiulli, Lavon Paganini, PA-C  polyethylene glycol (MIRALAX / GLYCOLAX) 17 g packet Take 17 g by mouth daily. Patient taking differently: Take 17 g by mouth daily as needed for mild constipation or moderate constipation. 4-8 ounces 02/09/19  Yes Alma Friendly, MD  tamsulosin [facility]) 0.4 MG CAPS capsule Take 1 capsule (0.4 mg total) by mouth daily after breakfast. 03/13/19  Yes Angiulli, Lavon Paganini, PA-C   No results found. - pertinent xrays, CT, MRI studies were reviewed and independently interpreted  Positive ROS: All other systems have been reviewed and were otherwise negative with the exception of those mentioned in the HPI and as above.  Physical Exam: General: Alert, no acute distress Psychiatric: Patient is competent for consent with normal mood and affect Lymphatic: No axillary or cervical lymphadenopathy Cardiovascular: No pedal edema Respiratory: No cyanosis, no use of accessory musculature GI: No organomegaly, abdomen is soft and non-tender    Images:  _0 @  Labs:  Lab  Results  Component Value Date   HGBA1C 5.0 01/31/2019   ESRSEDRATE >140 (H) 03/28/2019   CRP 19.4 (H) 03/28/2019   REPTSTATUS 04/02/2019 FINAL 03/28/2019   CULT  03/28/2019    NO GROWTH 5 DAYS Performed at [facility], [place] [address], [place], [place] [address]    Doy Hutching ENTEROCOCCUS FAECIUM (A) 03/10/2019    Lab Results  Component Value Date   ALBUMIN 1.8 (L) 04/03/2019   ALBUMIN 1.9 (L) 04/01/2019   ALBUMIN 1.9 (L) 03/31/2019   PREALBUMIN 9.1 (L) 03/28/2019    Neurologic: Patient does not have protective sensation bilateral lower extremities.  MUSCULOSKELETAL:   Skin: Examination patient has black indurated ulceration of the right leg and medial right thigh.  The right thigh has hard indurated fascia which is tender to palpation there is no crepitation.  Patient has a palpable dorsalis pedis pulse he does have venous stasis changes in his leg but does not have pitting edema  Patient's recent hemoglobin A1c was 5.0 and his albumin was 1.8 with severe protein caloric malnutrition.  Assessment: Assessment: End-stage renal disease on dialysis with diabetes and severe protein caloric malnutrition with necrotic ulceration of the right thigh and right leg.  Plan: Recommended proceeding with surgery for debridement of the ulceration with the hard induration of the thigh ulcer I do not feel this will heal with conservative wound care.  Discussed with the patient that he may require several surgical interventions for limb salvage and patient may require an amputation of the entire right lower extremity.  Will plan to proceed with initial surgery debridement on Friday.  Thank you for the consult and the opportunity to see Michael Riggs, [place] [phone] 7:17 AM

## 2019-04-04 NOTE — Progress Notes (Addendum)
Subjective:  Seen in room co leg pain / tolerated uf on HD yesterday   Objective Vital signs in last 24 hours: Vitals:   04/03/19 1250 04/03/19 1325 04/04/19 0322 04/04/19 0942  BP: 101/66 116/62 108/71 114/68  Pulse: 84 82 83 80  Resp: 18 18 16 18   Temp: 97.9 F (36.6 C) 98.2 F (36.8 C) 97.9 F (36.6 C) 98 F (36.7 C)  TempSrc: Oral Oral Oral Oral  SpO2: 96% 92% 91% 93%  Weight: 110.9 kg     Height:       Weight change:   Physical Exam: General: Alert, Chronically ill appearing Male, NAD   Heart: RRR no rub Lungs: CTA  Anter. , non labored Abdomen: obese, soft, NT, ND  Withsome  abd wall edema  Extremities: R LE Wounds  black indurated ulcerations /dry eschar with edema trace / Left  BKA  No stump edema , Left inner thigh hard dark Eschar wound . Some bilat thigh edema  Dialysis Access: R IJ Perm cath  Patent on HD / LUA AVF  + bruit     Dialysis Orders: [facility] MWF 4 hr EDW 120 400/800 left upper AVF maturing and right IJ TDC heaprin 4K venofer 100 x 2 more doses Micrea 100 q 2 wk last 8/.26 calcitriol 0.25  Recent labs: hgb 7.7 14% sat iPTH 258 Ca/P ok  Problem/Plan: 1. RLE wounds and left thigh wound - prior path from punch bx 8/5 showed necrotic tissue with acute  inflammation. Seen by ortho -Plastics saw 9/11 - indicates no plan for surgical intervention and recommends care as per wound care RN .  And Dr DUDA today noted for Debridement  of ulcers this Friday/BC no growthon Vanc/Maxepime/flagyl; based on appearance of wounds and severity of pain, Have  empirically begin Na thiosulfate MWF with HD to limit further calcific uremic arteriopathy by avoiding calcitriol/Ca containing binders. 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 Will start Na thio, avoid all Ca/ Vit D products, use low Ca (2.0) bath and hope that these wounds heal.  Antibiotics generally speaking aren't usually needed for this unless there is an obvious  secondary infection. Also surgical debridement should be reserved for active secondary infection w/ calciphylaxis, can make it worse otherwise. Have d/w Dr Sharol Given and agreed we will call him if infection becomes a concern.     Also needing more daily  HD  for his Calciphylaxis  Therapy  2. ESRD- MWF -next HD  Tomorrow on schedule / will dw pt tomr more hd for tx of his Calciphylaxis  3. Hypertension/volume- prone to high IDWG/ with anasarca - no BP meds  - net UF 4 L Friday /and 3.9 l yesterday  - continue to lower volume serially- re-establish EDW at d/c  4. Anemia- hgb8.2 was  transfused 1 unit PRBC 9/9 -d/c on 100 Mircera - last dose 8/26 - ^ Aranesp to 150q Wed- suspect some ESA resistance due to inflammation 5. Metabolic bone disease- no calciphylaxis on bx, but wounds similar to that; holding calcitriol for now iPTH not high but wound examand degree of pain mimic calciphylaxis- see above plans 6. Nutrition- renal diet/vits/restrict fluids alb low - add prostat 7. Hx left BKA  8.Urinary retention issues- d/c on flomax and low dose urecholine - w/ instruction to f/u with urology after last d/c 9. Disp - return to SNF vs home  BUT will need rehab  probably   Ernest Haber, PA-C  [place] [phone] 04/04/2019,3:15 PM  LOS: 7 days   Pt seen, examined and agree w assess/plan as above with additions as indicated.  [facility] 04/04/2019, 4:45 PM     Labs: Basic Metabolic Panel: Recent Labs  Lab 04/01/19 0523 04/03/19 0934 04/04/19 0504  NA 130* 130* 135  K 3.2* 3.3* 3.2*  CL 95* 93* 94*  CO2 24 23 21*  GLUCOSE 110* 153* 125*  BUN 25* 57* 37*  CREATININE 4.87* 8.38* 5.98*  CALCIUM 8.0* 8.2* 8.7*  PHOS  --  6.4*  --    Liver Function Tests: Recent Labs  Lab 03/30/19 0824 03/31/19 0537 04/01/19 0523 04/03/19 0934  AST 16 16 17   --   ALT 15 14 12   --   ALKPHOS 91 93 100  --   BILITOT 1.0 0.9 0.9  --    PROT 8.1 8.2* 8.5*  --   ALBUMIN 2.0* 1.9* 1.9* 1.8*   No results for input(s): LIPASE, AMYLASE in the last 168 hours. No results for input(s): AMMONIA in the last 168 hours. CBC: Recent Labs  Lab 03/31/19 0537 04/01/19 0523 04/02/19 0401 04/03/19 0447 04/04/19 0504  WBC 12.0* 13.5* 16.4* 18.1* 20.3*  NEUTROABS 7.9* 8.4* 11.2* 12.6* 12.9*  HGB 7.4* 8.1* 8.4* 8.2* 8.2*  HCT 24.5* 27.0* 28.4* 27.5* 26.0*  MCV 90.4 90.3 90.7 90.8 88.1  PLT 325 308 350 342 361   Cardiac Enzymes: No results for input(s): CKTOTAL, CKMB, CKMBINDEX, TROPONINI in the last 168 hours. CBG: Recent Labs  Lab 03/28/19 2238 04/03/19 0623 04/03/19 0839  GLUCAP 88 153* 138*    Studies/Results: No results found. Medications: . ceFEPime (MAXIPIME) IV 2 g (04/03/19 1731)  . sodium thiosulfate infusion for calciphylaxis Stopped (04/03/19 1239)   . sodium chloride   Intravenous Once  . sodium chloride   Intravenous Once  . atorvastatin  40 mg Oral Q2000  . bethanechol  25 mg Oral TID  . Chlorhexidine Gluconate Cloth  6 each Topical Q0600  . citalopram  40 mg Oral Daily  . darbepoetin (ARANESP) injection - DIALYSIS  150 mcg Intravenous Q Wed-HD  . feeding supplement (NEPRO CARB STEADY)  237 mL Oral Q24H  . feeding supplement (PRO-STAT SUGAR FREE 64)  30 mL Oral BID  . fentaNYL  1 patch Transdermal Q72H  . gabapentin  300 mg Oral QHS  . heparin  5,000 Units Subcutaneous Q8H  . Melatonin  4.5 mg Oral QHS  . multivitamin  1 tablet Oral QHS  . pantoprazole  40 mg Oral Daily  . tamsulosin  0.4 mg Oral QPC breakfast

## 2019-04-04 NOTE — Progress Notes (Signed)
PROGRESS NOTE    Michael Riggs  [name] DOB: [birth year] DOA: 03/28/2019 PCP: [name], MD   Brief Narrative:  48 year old with history of ESRD on TTS HD, hypertension hyperlipidemia sent from SNF for evaluation of fever, confusion right lower extremity pain concerning for infection.  Within last 2 months he had been admitting in the hospital for worsening of renal function and fluid overload.  Started on dialysis and eventually sent to rehab.  This time admitted for sepsis secondary to right lower extremity cellulitis with concerns for necrotic tissue.  Orthopedic and plastic surgery consulted who recommended medical management and have Dr. Sharol Given follow-up once he returns from his vacation.   Assessment & Plan:   Principal Problem:   Wound infection Active Problems:   Hypertension   ESRD (end stage renal disease) on dialysis (HCC)   Dyslipidemia   Diabetes mellitus with peripheral vascular disease (HCC)   Depression   Physical deconditioning   Sepsis secondary to right lower extremity cellulitis with some pruritus Chronic Eschar Acute calciphylaxis with pain - Does have chronic shortness with low-grade infection -Continue cefepime, discontinued vancomycin and Flagyl -Seen by orthopedic and plastic surgery -Orthopedic seeing the patient, plans for surgery on Friday -Pain control-continue oral oxycodone, I will also add fentanyl patch.  ESRD on hemodialysis -Nephrology team following.  Essential hypertension - Appears to be on Lasix at home otherwise diet controlled?  Hyperlipidemia - On Lipitor 40 mg daily  History of recent syphilis treated with penicillin  Diabetes mellitus type 2 Peripheral neuropathy -Recent hemoglobin A1c 5.0. -Insulin sliding scale.  Continue gabapentin  DVT prophylaxis: Subcutaneous heparin Code Status: Full code Family Communication: None at bedside Disposition Plan: Maintain hospital stay for surgical intervention on upcoming  Friday  Consultants:   Plastic surgery  Orthopedic  Procedures:   None  Antimicrobials:   Cefepime   Subjective: Sleeping but when woken up he was complaining of lots of right lower extremity pain.  Review of Systems Otherwise negative except as per HPI, including: General = no fevers, chills, dizziness, malaise, fatigue HEENT/EYES = negative for pain, redness, loss of vision, double vision, blurred vision, loss of hearing, sore throat, hoarseness, dysphagia Cardiovascular= negative for chest pain, palpitation, murmurs, lower extremity swelling Respiratory/lungs= negative for shortness of breath, cough, hemoptysis, wheezing, mucus production Gastrointestinal= negative for nausea, vomiting,, abdominal pain, melena, hematemesis Genitourinary= negative for Dysuria, Hematuria, Change in Urinary Frequency MSK = right lower extremity pain Neurology= Negative for headache, seizures, numbness, tingling  Psychiatry= Negative for anxiety, depression, suicidal and homocidal ideation Allergy/Immunology= Medication/Food allergy as listed  Skin= Negative for Rash, lesions, ulcers, itching   Objective: Vitals:   04/03/19 1250 04/03/19 1325 04/04/19 0322 04/04/19 0942  BP: 101/66 116/62 108/71 114/68  Pulse: 84 82 83 80  Resp: 18 18 16 18   Temp: 97.9 F (36.6 C) 98.2 F (36.8 C) 97.9 F (36.6 C) 98 F (36.7 C)  TempSrc: Oral Oral Oral Oral  SpO2: 96% 92% 91% 93%  Weight: 110.9 kg     Height:        Intake/Output Summary (Last 24 hours) at 04/04/2019 1103 Last data filed at 04/04/2019 0900 Gross per 24 hour  Intake 1078.03 ml  Output 3903 ml  Net -2824.97 ml   Filed Weights   04/01/19 0344 04/03/19 0820 04/03/19 1250  Weight: 113.5 kg 114.7 kg 110.9 kg    Examination:  Constitutional: NAD, calm, comfortable Eyes: PERRL, lids and conjunctivae normal ENMT: Mucous membranes are moist. Posterior pharynx clear  of any exudate or lesions.Normal dentition.  Neck: normal,  supple, no masses, no thyromegaly Respiratory: clear to auscultation bilaterally, no wheezing, no crackles. Normal respiratory effort. No accessory muscle use.  Cardiovascular: Regular rate and rhythm, no murmurs / rubs / gallops. No extremity edema. 2+ pedal pulses. No carotid bruits.  Abdomen: no tenderness, no masses palpated. No hepatosplenomegaly. Bowel sounds positive.  Musculoskeletal: Left-sided BKA noted Skin: Right lower extremity shows black indurated ulceration of the right leg and medial thigh.  Indurated fascia tender to palpation. Neurologic: CN 2-12 grossly intact. Sensation intact, DTR normal. Strength 5/5 in all 4.  Psychiatric: Normal judgment and insight. Alert and oriented x 3. Normal mood.   Hemodialysis catheter noted in his right neck Left upper extremity AVF  Data Reviewed:   CBC: Recent Labs  Lab 03/31/19 0537 04/01/19 0523 04/02/19 0401 04/03/19 0447 04/04/19 0504  WBC 12.0* 13.5* 16.4* 18.1* 20.3*  NEUTROABS 7.9* 8.4* 11.2* 12.6* 12.9*  HGB 7.4* 8.1* 8.4* 8.2* 8.2*  HCT 24.5* 27.0* 28.4* 27.5* 26.0*  MCV 90.4 90.3 90.7 90.8 88.1  PLT 325 308 350 342 [id number]   Basic Metabolic Panel: Recent Labs  Lab 03/30/19 0824 03/31/19 0537 04/01/19 0523 04/03/19 0934 04/04/19 0504  NA 134* 133* 130* 130* 135  K 3.4* 3.2* 3.2* 3.3* 3.2*  CL 98 98 95* 93* 94*  CO2 26 23 24 23  21*  GLUCOSE 86 92 110* 153* 125*  BUN 24* 39* 25* 57* 37*  CREATININE 4.97* 6.88* 4.87* 8.38* 5.98*  CALCIUM 8.4* 8.3* 8.0* 8.2* 8.7*  MG  --   --   --   --  1.8  PHOS  --   --   --  6.4*  --    GFR: Estimated Creatinine Clearance: 19.1 mL/min (A) (by C-G formula based on SCr of 5.98 mg/dL (H)). Liver Function Tests: Recent Labs  Lab 03/30/19 0824 03/31/19 0537 04/01/19 0523 04/03/19 0934  AST 16 16 17   --   ALT 15 14 12   --   ALKPHOS 91 93 100  --   BILITOT 1.0 0.9 0.9  --   PROT 8.1 8.2* 8.5*  --   ALBUMIN 2.0* 1.9* 1.9* 1.8*   No results for input(s): LIPASE, AMYLASE in  the last 168 hours. No results for input(s): AMMONIA in the last 168 hours. Coagulation Profile: No results for input(s): INR, PROTIME in the last 168 hours. Cardiac Enzymes: No results for input(s): CKTOTAL, CKMB, CKMBINDEX, TROPONINI in the last 168 hours. BNP (last 3 results) No results for input(s): PROBNP in the last 8760 hours. HbA1C: No results for input(s): HGBA1C in the last 72 hours. CBG: Recent Labs  Lab 03/28/19 2238 04/03/19 0623 04/03/19 0839  GLUCAP 88 153* 138*   Lipid Profile: No results for input(s): CHOL, HDL, LDLCALC, TRIG, CHOLHDL, LDLDIRECT in the last 72 hours. Thyroid Function Tests: No results for input(s): TSH, T4TOTAL, FREET4, T3FREE, THYROIDAB in the last 72 hours. Anemia Panel: No results for input(s): VITAMINB12, FOLATE, FERRITIN, TIBC, IRON, RETICCTPCT in the last 72 hours. Sepsis Labs: Recent Labs  Lab 03/28/19 2028 03/28/19 2349  LATICACIDVEN 0.9 1.0    Recent Results (from the past 240 hour(s))  Culture, blood (Routine x 2)     Status: None   Collection Time: 03/28/19  5:20 AM   Specimen: BLOOD LEFT ARM  Result Value Ref Range Status   Specimen Description BLOOD LEFT ARM  Final   Special Requests   Final    BOTTLES DRAWN  AEROBIC AND ANAEROBIC Blood Culture adequate volume   Culture   Final    NO GROWTH 5 DAYS Performed at [facility], [place] [address] [name], [place] [address]    Report Status 04/02/2019 FINAL  Final  Culture, blood (Routine x 2)     Status: None   Collection Time: 03/28/19  5:30 AM   Specimen: BLOOD LEFT ARM  Result Value Ref Range Status   Specimen Description BLOOD LEFT ARM  Final   Special Requests   Final    BOTTLES DRAWN AEROBIC AND ANAEROBIC Blood Culture results may not be optimal due to an excessive volume of blood received in culture bottles   Culture   Final    NO GROWTH 5 DAYS Performed at [facility], [place] [address], [place], [place] [address]    Report Status 04/02/2019 FINAL  Final   SARS Coronavirus 2 [facility] order, Performed in [facility] hospital lab) Nasopharyngeal Nasopharyngeal Swab     Status: None   Collection Time: 03/28/19  6:05 AM   Specimen: Nasopharyngeal Swab  Result Value Ref Range Status   SARS Coronavirus 2 NEGATIVE NEGATIVE Final    Comment: (NOTE) If result is NEGATIVE SARS-CoV-2 target nucleic acids are NOT DETECTED. The SARS-CoV-2 RNA is generally detectable in upper and lower  respiratory specimens during the acute phase of infection. The lowest  concentration of SARS-CoV-2 viral copies this assay can detect is 250  copies / mL. A negative result does not preclude SARS-CoV-2 infection  and should not be used as the sole basis for treatment or other  patient management decisions.  A negative result may occur with  improper specimen collection / handling, submission of specimen other  than nasopharyngeal swab, presence of viral mutation(s) within the  areas targeted by this assay, and inadequate number of viral copies  (<250 copies / mL). A negative result must be combined with clinical  observations, patient history, and epidemiological information. If result is POSITIVE SARS-CoV-2 target nucleic acids are DETECTED. The SARS-CoV-2 RNA is generally detectable in upper and lower  respiratory specimens dur ing the acute phase of infection.  Positive  results are indicative of active infection with SARS-CoV-2.  Clinical  correlation with patient history and other diagnostic information is  necessary to determine patient infection status.  Positive results do  not rule out bacterial infection or co-infection with other viruses. If result is PRESUMPTIVE POSTIVE SARS-CoV-2 nucleic acids MAY BE PRESENT.   A presumptive positive result was obtained on the submitted specimen  and confirmed on repeat testing.  While 2019 novel coronavirus  (SARS-CoV-2) nucleic acids may be present in the submitted sample  additional confirmatory testing may be  necessary for epidemiological  and / or clinical management purposes  to differentiate between  SARS-CoV-2 and other Sarbecovirus currently known to infect humans.  If clinically indicated additional testing with an alternate test  methodology [phone]) is advised. The SARS-CoV-2 RNA is generally  detectable in upper and lower respiratory sp ecimens during the acute  phase of infection. The expected result is Negative. Fact Sheet for Patients:  StrictlyIdeas.no Fact Sheet for Healthcare Providers: BankingDealers.co.za This test is not yet approved or cleared by the [place] FDA and has been authorized for detection and/or diagnosis of SARS-CoV-2 by FDA under an Emergency Use Authorization (EUA).  This EUA will remain in effect (meaning this test can be used) for the duration of the COVID-19 declaration under Section 564(b)(1) of the Act, 21  U.S.C. section 360bbb-3(b)(1), unless the authorization is terminated or revoked sooner. Performed at [facility], [place] [address] [place], [place] [address]   C difficile quick scan w PCR reflex     Status: None   Collection Time: 04/02/19  6:35 AM   Specimen: STOOL  Result Value Ref Range Status   C Diff antigen NEGATIVE NEGATIVE Final   C Diff toxin NEGATIVE NEGATIVE Final   C Diff interpretation No C. difficile detected.  Final    Comment: Performed at [facility], [place] [address] [place], [place] [address]  Novel Coronavirus, NAA (hospital order; send-out to ref lab)     Status: None   Collection Time: 04/02/19  3:38 PM   Specimen: Nasopharyngeal Swab; Respiratory  Result Value Ref Range Status   SARS-CoV-2, NAA NOT DETECTED NOT DETECTED Final    Comment: (NOTE) This nucleic acid amplification test was developed and its performance characteristics determined by [employer]. Nucleic acid amplification tests include PCR and TMA. This test has not been FDA cleared or  approved. This test has been authorized by FDA under an Emergency Use Authorization (EUA). This test is only authorized for the duration of time the declaration that circumstances exist justifying the authorization of the emergency use of in vitro diagnostic tests for detection of SARS-CoV-2 virus and/or diagnosis of COVID-19 infection under section 564(b)(1) of the Act, 21 U.S.C. [id number]) (1), unless the authorization is terminated or revoked sooner. When diagnostic testing is negative, the possibility of a false negative result should be considered in the context of a patient's recent exposures and the presence of clinical signs and symptoms consistent with COVID-19. An individual without symptoms of COVID- 19 and who is not shedding SARS-CoV-2 vi rus would expect to have a negative (not detected) result in this assay. Performed At: [facility] [address] [place], [place] [id number] Rush Farmer MD [id number]    [place]  Final    Comment: Performed at [facility], [place] [address] [place], [place] [address]         Radiology Studies: No results found.      Scheduled Meds: . sodium chloride   Intravenous Once  . sodium chloride   Intravenous Once  . atorvastatin  40 mg Oral Q2000  . bethanechol  25 mg Oral TID  . Chlorhexidine Gluconate Cloth  6 each Topical Q0600  . citalopram  40 mg Oral Daily  . darbepoetin (ARANESP) injection - DIALYSIS  150 mcg Intravenous Q Wed-HD  . feeding supplement (PRO-STAT SUGAR FREE 64)  30 mL Oral BID  . fentaNYL  1 patch Transdermal Q72H  . gabapentin  300 mg Oral QHS  . heparin  5,000 Units Subcutaneous Q8H  . Melatonin  4.5 mg Oral QHS  . multivitamin  1 tablet Oral QHS  . pantoprazole  40 mg Oral Daily  . tamsulosin  0.4 mg Oral QPC breakfast   Continuous Infusions: . ceFEPime (MAXIPIME) IV 2 g (04/03/19 1731)  . sodium thiosulfate infusion for calciphylaxis Stopped (04/03/19  1239)     LOS: 7 days   Time spent= 35 mins    Gaelen Brager Arsenio Loader, MD [facility]  If 7PM-7AM, please contact night-coverage [email or web address] 04/04/2019, 11:03 AM

## 2019-04-04 NOTE — Progress Notes (Signed)
Nutrition Follow-up  DOCUMENTATION CODES:   Morbid obesity  INTERVENTION:   -Continue renal MVI daily -Add Nepro Shake po daily, each supplement provides 425 kcal and 19 grams protein -Continue ml Prostat TID, each supplement provides 100 kcals and 15 grams protein  NUTRITION DIAGNOSIS:   Increased nutrient needs related to wound healing as evidenced by estimated needs.  Ongoing  GOAL:   Patient will meet greater than or equal to 90% of their needs  Progressing  MONITOR:   PO intake, Supplement acceptance, Labs, Weight trends, Skin, I & O's  REASON FOR ASSESSMENT:   Consult Wound healing  ASSESSMENT:   Michael Riggs is a 48 y.o. male with medical history significant of HTN; HLD; and ESRD on TTS HD presenting with SOB.  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   He wanted pain medicine that day due to R leg pain.  +SOB, starting last night.  +cough.  +fever.  R leg infection.   Plan for debridement of right lower extremity Friday. L thigh lesion consistent with calciphylaxis.   Spoke with pt at bedside. Going in and out of sleep during follow up. States appetite is poor. Meal completions charted as 50-100% for pt's last six meals. Discussed the importance of protein intake for preservation of lean body mass and promote healing. Will continue with Prostat and trial Nepro.   EDW: 120 kg Current weight: 110.9 kg  I/O: -8,635 ml since admit Last HD yesterday: 3900 ml net UF  Drips: sodium thiosulfate Medications: aranesp, rena-vit Labs: K 3.2 (L) Phosphorus 6.4 (H) corrected calcium 10 (wdl)   Diet Order:   Diet Order            Diet renal with fluid restriction Fluid restriction: 1200 mL Fluid; Room service appropriate? Yes; Fluid consistency: Thin  Diet effective now              EDUCATION NEEDS:   No education needs have been identified at this time  Skin:  Skin Assessment: Reviewed RN  Assessment Skin Integrity Issues:: Other (Comment) Other: R leg/R pretibial wound, L leg lesion (calciphylaxis), pressure injury penis  Last BM:  9/14  Height:   Ht Readings from Last 1 Encounters:  03/28/19 5\' 10"  (1.778 m)    Weight:   Wt Readings from Last 1 Encounters:  04/03/19 110.9 kg    Ideal Body Weight:  70.6 kg  BMI:  Body mass index is 35.08 kg/m.  Estimated Nutritional Needs:   Kcal:  1900-2100  Protein:  115-130 grams  Fluid:  1000 ml +UOP   Mariana Single RD, LDN Clinical Nutrition Pager # [phone]

## 2019-04-05 LAB — BASIC METABOLIC PANEL
Anion gap: 18 — ABNORMAL HIGH (ref 5–15)
BUN: 50 mg/dL — ABNORMAL HIGH (ref 6–20)
CO2: 18 mmol/L — ABNORMAL LOW (ref 22–32)
Calcium: 8.7 mg/dL — ABNORMAL LOW (ref 8.9–10.3)
Chloride: 96 mmol/L — ABNORMAL LOW (ref 98–111)
Creatinine, Ser: 7.34 mg/dL — ABNORMAL HIGH (ref 0.61–1.24)
GFR calc Af Amer: 9 mL/min — ABNORMAL LOW (ref >60)
GFR calc non Af Amer: 8 mL/min — ABNORMAL LOW (ref >60)
Glucose, Bld: 138 mg/dL — ABNORMAL HIGH (ref 70–99)
Potassium: 3.8 mmol/L (ref 3.5–5.1)
Sodium: 132 mmol/L — ABNORMAL LOW (ref 135–145)

## 2019-04-05 LAB — CBC
HCT: 26.1 % — ABNORMAL LOW (ref 39.0–52.0)
Hemoglobin: 7.9 g/dL — ABNORMAL LOW (ref 13.0–17.0)
MCH: 26.9 pg (ref 26.0–34.0)
MCHC: 30.3 g/dL (ref 30.0–36.0)
MCV: 88.8 fL (ref 80.0–100.0)
Platelets: 372 10*3/uL (ref 150–400)
RBC: 2.94 MIL/uL — ABNORMAL LOW (ref 4.22–5.81)
RDW: 15.9 % — ABNORMAL HIGH (ref 11.5–15.5)
WBC: 19.4 10*3/uL — ABNORMAL HIGH (ref 4.0–10.5)
nRBC: 0 % (ref 0.0–0.2)

## 2019-04-05 LAB — MAGNESIUM: Magnesium: 2 mg/dL (ref 1.7–2.4)

## 2019-04-05 MED ORDER — DARBEPOETIN ALFA 150 MCG/0.3ML IJ SOSY
PREFILLED_SYRINGE | INTRAMUSCULAR | Status: AC
  Administered 2019-04-05: 150 ug via INTRAVENOUS
  Filled 2019-04-05: qty 0.3

## 2019-04-05 MED ORDER — POTASSIUM CHLORIDE CRYS ER 20 MEQ PO TBCR
20.0000 meq | EXTENDED_RELEASE_TABLET | Freq: Two times a day (BID) | ORAL | Status: DC
  Administered 2019-04-05 – 2019-04-09 (×10): 20 meq via ORAL
  Filled 2019-04-05 (×12): qty 1

## 2019-04-05 MED ORDER — OXYCODONE HCL 5 MG PO TABS
10.0000 mg | ORAL_TABLET | Freq: Four times a day (QID) | ORAL | Status: DC | PRN
  Administered 2019-04-06 – 2019-04-11 (×9): 10 mg via ORAL
  Filled 2019-04-05 (×8): qty 2

## 2019-04-05 MED ORDER — HEPARIN SODIUM (PORCINE) 1000 UNIT/ML IJ SOLN
INTRAMUSCULAR | Status: AC
  Administered 2019-04-05: 3800 [IU] via ARTERIOVENOUS_FISTULA
  Filled 2019-04-05: qty 1

## 2019-04-05 MED ORDER — MORPHINE SULFATE (PF) 2 MG/ML IV SOLN
2.0000 mg | Freq: Once | INTRAVENOUS | Status: AC
  Administered 2019-04-05: 11:00:00 2 mg via INTRAVENOUS

## 2019-04-05 MED ORDER — MORPHINE SULFATE (PF) 2 MG/ML IV SOLN
2.0000 mg | INTRAVENOUS | Status: DC | PRN
  Administered 2019-04-05 – 2019-04-12 (×7): 2 mg via INTRAVENOUS
  Filled 2019-04-05 (×6): qty 1

## 2019-04-05 MED ORDER — CHLORHEXIDINE GLUCONATE CLOTH 2 % EX PADS: 6.0000 | MEDICATED_PAD | Freq: Every day | CUTANEOUS | Status: DC

## 2019-04-05 MED ORDER — MORPHINE SULFATE (PF) 2 MG/ML IV SOLN
INTRAVENOUS | Status: AC
  Administered 2019-04-05: 2 mg via INTRAVENOUS
  Filled 2019-04-05: qty 1

## 2019-04-05 NOTE — Plan of Care (Signed)
  Problem: Education: Goal: Knowledge of General Education information will improve Description: Including pain rating scale, medication(s)/side effects and non-pharmacologic comfort measures Outcome: Progressing   Problem: Pain Managment: Goal: General experience of comfort will improve Outcome: Progressing   

## 2019-04-05 NOTE — Progress Notes (Addendum)
Subjective:  Seen on hd , leg pain now appears stable with current pain med's, dw pt need for HD Increase daily  for Calciphylaxis tx  He agrees  Objective Vital signs in last 24 hours: Vitals:   04/05/19 1115 04/05/19 1130 04/05/19 1200 04/05/19 1222  BP: (!) 89/62 106/66 107/68 (!) 118/91  Pulse: 79 78 79 78  Resp:    16  Temp:    98.9 F (37.2 C)  TempSrc:    Oral  SpO2:    94%  Weight:      Height:       Weight change: -4.2 kg  Physical Exam: General:Alert, Chronically ill appearing Male, NAD  Heart:RRR no rub Lungs:CTA Anter. , non labored Abdomen:obese, soft, NT, ND With some abd wall edema resolving   Extremities:R LE Wounds  black indurated ulcerations /dry eschar with edema trace / Left BKA No stump edema , Left inner thigh hard dark Eschar wound . Some bilat thigh edema  Dialysis Access:R IJ Perm cathPatent on HD / LUA AVF + bruit   Dialysis Orders: [facility] MWF 4 hr EDW 120 400/800 left upper AVF maturing and right IJ TDC heaprin 4K venofer 100 x 2 more doses Micrea 100 q 2 wk last 8/.26 calcitriol 0.25  Recent labs: hgb 7.7 14% sat iPTH 258 Ca/P ok  Problem/Plan: 1. RLE wounds and left thigh wound - prior path from punch bx 8/5 showed necrotic tissue with acute  inflammation. Seen by ortho -Plastics saw 9/11 -   And Dr DUDA 9/15 consulted  noted for Debridement  of ulcers this Friday but see note below /BC no growthon Vanc/Maxepime/flagyl; based on appearance of wounds and severity of pain, Have  empirically begin Na thiosulfate MWF with HD to limit further calcific uremic arteriopathy by avoiding calcitriol/Ca containing binders also increase to daily hd  To lower ca load this week  . Leg lesions looks very much like calciphylaxis. We have started patient on IV Na thio tiw w/ HD. Cont to avoid all Ca/ Vit D products, and use low Ca (2.0) bath. Avoid abx or surgical I&D unless obvious secondary infection. Pain control is important and can be difficult.  Extra dialysis is recommended as well, as long as pt can tolerate, see below.   2. ESRD- MWF -next HD  Tomorrow on schedule /more hd for tx of his Calciphylaxis  3. Hypertension/volume- as op  high IDWG/ with anasarca on admit  - no BP meds  - net UF 4 L Friday /and 3.9 l9/14  Today bp dropping some=? ? Getting closer to new lower edw Vs pain med effect /back off uf today as he will have serial hd this week   - re-establish EDW at d/c  4. Anemia- hgb8.2 >7.9 wastransfused 1 unit PRBC 9/9 -d/c on 100 Mircera - last dose 8/26 - ^ Aranesp to 150q Wed- suspect some ESA resistance due to inflammation 5. Metabolic bone disease- no calciphylaxis on bx, but wounds similar to that; holding calcitriol for now iPTH not high but wound examand degree of pain mimic calciphylaxis- see above plans 6. Nutrition- renal diet/vits/restrict fluids alb low - added prostat 7. Hx left BKA  8.Urinary retention issues- d/c on flomax and low dose urecholine - w/ instruction to f/u with urology after last d/c 9. Disp - return to SNF vs home  BUT will need rehab  probably   Ernest Haber, PA-C [place] [phone] 04/05/2019,1:23 PM  LOS: 8 days   Pt  seen, examined and agree w assess/plan as above with additions as indicated.  [facility] 04/05/2019, 4:16 PM     Labs: Basic Metabolic Panel: Recent Labs  Lab 04/03/19 0934 04/04/19 0504 04/05/19 0459  NA 130* 135 132*  K 3.3* 3.2* 3.8  CL 93* 94* 96*  CO2 23 21* 18*  GLUCOSE 153* 125* 138*  BUN 57* 37* 50*  CREATININE 8.38* 5.98* 7.34*  CALCIUM 8.2* 8.7* 8.7*  PHOS 6.4*  --   --    Liver Function Tests: Recent Labs  Lab 03/30/19 0824 03/31/19 0537 04/01/19 0523 04/03/19 0934  AST 16 16 17   --   ALT 15 14 12   --   ALKPHOS 91 93 100  --   BILITOT 1.0 0.9 0.9  --   PROT 8.1 8.2* 8.5*  --   ALBUMIN 2.0* 1.9* 1.9* 1.8*   No results for input(s): LIPASE, AMYLASE in the last 168  hours. No results for input(s): AMMONIA in the last 168 hours. CBC: Recent Labs  Lab 04/01/19 0523 04/02/19 0401 04/03/19 0447 04/04/19 0504 04/05/19 0459  WBC 13.5* 16.4* 18.1* 20.3* 19.4*  NEUTROABS 8.4* 11.2* 12.6* 12.9*  --   HGB 8.1* 8.4* 8.2* 8.2* 7.9*  HCT 27.0* 28.4* 27.5* 26.0* 26.1*  MCV 90.3 90.7 90.8 88.1 88.8  PLT 308 350 342 361 372   Cardiac Enzymes: No results for input(s): CKTOTAL, CKMB, CKMBINDEX, TROPONINI in the last 168 hours. CBG: Recent Labs  Lab 04/03/19 0623 04/03/19 0839  GLUCAP 153* 138*    Studies/Results: No results found. Medications: . ceFEPime (MAXIPIME) IV 2 g (04/03/19 1731)  . sodium thiosulfate infusion for calciphylaxis 25 g (04/05/19 1149)   . atorvastatin  40 mg Oral Q2000  . bethanechol  25 mg Oral TID  . Chlorhexidine Gluconate Cloth  6 each Topical Q0600  . citalopram  40 mg Oral Daily  . darbepoetin (ARANESP) injection - DIALYSIS  150 mcg Intravenous Q Wed-HD  . feeding supplement (NEPRO CARB STEADY)  237 mL Oral Q24H  . feeding supplement (PRO-STAT SUGAR FREE 64)  30 mL Oral BID  . fentaNYL  1 patch Transdermal Q72H  . gabapentin  300 mg Oral QHS  . heparin  5,000 Units Subcutaneous Q8H  . Melatonin  4.5 mg Oral QHS  . multivitamin  1 tablet Oral QHS  . pantoprazole  40 mg Oral Daily  . potassium chloride  20 mEq Oral BID  . tamsulosin  0.4 mg Oral QPC breakfast

## 2019-04-05 NOTE — Progress Notes (Signed)
PROGRESS NOTE    Michael Riggs  [id number] DOB: 1971/03/26 DOA: 03/28/2019 PCP: [name], MD   Brief Narrative:  48 year old with history of ESRD on TTS HD, hypertension hyperlipidemia sent from SNF for evaluation of fever, confusion right lower extremity pain concerning for infection.  Within last 2 months he had been admitting in the hospital for worsening of renal function and fluid overload.  Started on dialysis and eventually sent to rehab.  This time admitted for sepsis secondary to right lower extremity cellulitis with concerns for necrotic tissue.  Orthopedic and plastic surgery consulted who recommended medical management and have Dr. Sharol Given follow-up once he returns from his vacation.  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  Continue cefepime - Orthopedic following.  Seen by plastic surgery -Surgical plan to be discussed with an orthopedic and nephrology -Pain control, bowel regimen. -Continue fentanyl patch, oxycodone IR.  Will add IV morphine.  ESRD on hemodialysis -Nephrology team following.  Essential hypertension - Appears to be on Lasix at home otherwise diet controlled?  Hyperlipidemia - On Lipitor 40 mg daily  History of recent syphilis treated with penicillin  Diabetes mellitus type 2 Peripheral neuropathy -Recent hemoglobin A1c 5.0. -Insulin sliding scale.  Continue gabapentin  DVT prophylaxis: Subcutaneous heparin Code Status: Full code Family Communication: None at bedside Disposition Plan: Maintain hospital stay for surgical  intervention and aggressive management of the foot  Consultants:   Plastic surgery  Orthopedic  Procedures:   None  Antimicrobials:   Cefepime   Subjective: Seen at dialysis.  Complaining of significant amount of right lower extremity pain.  Review of Systems Otherwise negative except as per HPI, including: General = no fevers, chills, dizziness, malaise, fatigue HEENT/EYES = negative for pain, redness, loss of vision, double vision, blurred vision, loss of hearing, sore throat, hoarseness, dysphagia Cardiovascular= negative for chest pain, palpitation, murmurs, lower extremity swelling Respiratory/lungs= negative for shortness of breath, cough, hemoptysis, wheezing, mucus production Gastrointestinal= negative for nausea, vomiting,, abdominal pain, melena, hematemesis Genitourinary= negative for Dysuria, Hematuria, Change in Urinary Frequency MSK = Negative for arthralgia, myalgias, Back Pain, Joint swelling  Neurology= Negative for headache, seizures, numbness, tingling  Psychiatry= Negative for anxiety, depression, suicidal and homocidal ideation Allergy/Immunology= Medication/Food allergy as listed  Skin= Negative for Rash, lesions, ulcers, itching   Objective: Vitals:   04/05/19 0750 04/05/19 0800 04/05/19 0830 04/05/19 0900  BP: 121/73 (!) 86/68 106/65 100/69  Pulse: 78 77 78 64  Resp:      Temp:      TempSrc:      SpO2:      Weight:      Height:        Intake/Output Summary (Last 24 hours) at 04/05/2019 1140 Last data filed at 04/05/2019 0545 Gross per 24 hour  Intake 1497 ml  Output 0 ml  Net 1497 ml   Filed Weights   04/03/19 1250 04/04/19 2028 04/05/19 0746  Weight: 110.9 kg 110.5 kg 111.8 kg    Examination:  Constitutional: NAD, calm, comfortable Eyes: PERRL, lids and conjunctivae normal ENMT: Mucous membranes are moist. Posterior pharynx  clear of any exudate or lesions.Normal dentition.  Neck: normal, supple, no masses, no  thyromegaly Respiratory: clear to auscultation bilaterally, no wheezing, no crackles. Normal respiratory effort. No accessory muscle use.  Cardiovascular: Regular rate and rhythm, no murmurs / rubs / gallops. No extremity edema. 2+ pedal pulses. No carotid bruits.  Abdomen: no tenderness, no masses palpated. No hepatosplenomegaly. Bowel sounds positive.  Musculoskeletal: no clubbing / cyanosis. No joint deformity upper and lower extremities. Good ROM, no contractures. Normal muscle tone.  Skin: Left sided AKA noted.  Significant amount of right lower extremity rash are noted.  Significant tender to palpation. Neurologic: CN 2-12 grossly intact. Sensation intact, DTR normal. Strength 4/5 in all 4.  Psychiatric: Normal judgment and insight. Alert and oriented x 3. Normal mood.   Hemodialysis catheter noted in his right neck Left upper extremity AVF  Data Reviewed:   CBC: Recent Labs  Lab 03/31/19 0537 04/01/19 0523 04/02/19 0401 04/03/19 0447 04/04/19 0504 04/05/19 0459  WBC 12.0* 13.5* 16.4* 18.1* 20.3* 19.4*  NEUTROABS 7.9* 8.4* 11.2* 12.6* 12.9*  --   HGB 7.4* 8.1* 8.4* 8.2* 8.2* 7.9*  HCT 24.5* 27.0* 28.4* 27.5* 26.0* 26.1*  MCV 90.4 90.3 90.7 90.8 88.1 88.8  PLT 325 308 350 342 361 [id number]   Basic Metabolic Panel: Recent Labs  Lab 03/31/19 0537 04/01/19 0523 04/03/19 0934 04/04/19 0504 04/05/19 0459  NA 133* 130* 130* 135 132*  K 3.2* 3.2* 3.3* 3.2* 3.8  CL 98 95* 93* 94* 96*  CO2 23 24 23  21* 18*  GLUCOSE 92 110* 153* 125* 138*  BUN 39* 25* 57* 37* 50*  CREATININE 6.88* 4.87* 8.38* 5.98* 7.34*  CALCIUM 8.3* 8.0* 8.2* 8.7* 8.7*  MG  --   --   --  1.8 2.0  PHOS  --   --  6.4*  --   --    GFR: Estimated Creatinine Clearance: 15.6 mL/min (A) (by C-G formula based on SCr of 7.34 mg/dL (H)). Liver Function Tests: Recent Labs  Lab 03/30/19 0824 03/31/19 0537 04/01/19 0523 04/03/19 0934  AST 16 16 17   --   ALT 15 14 12   --   ALKPHOS 91 93 100  --   BILITOT 1.0 0.9  0.9  --   PROT 8.1 8.2* 8.5*  --   ALBUMIN 2.0* 1.9* 1.9* 1.8*   No results for input(s): LIPASE, AMYLASE in the last 168 hours. No results for input(s): AMMONIA in the last 168 hours. Coagulation Profile: No results for input(s): INR, PROTIME in the last 168 hours. Cardiac Enzymes: No results for input(s): CKTOTAL, CKMB, CKMBINDEX, TROPONINI in the last 168 hours. BNP (last 3 results) No results for input(s): PROBNP in the last 8760 hours. HbA1C: No results for input(s): HGBA1C in the last 72 hours. CBG: Recent Labs  Lab 04/03/19 0623 04/03/19 0839  GLUCAP 153* 138*   Lipid Profile: No results for input(s): CHOL, HDL, LDLCALC, TRIG, CHOLHDL, LDLDIRECT in the last 72 hours. Thyroid Function Tests: No results for input(s): TSH, T4TOTAL, FREET4, T3FREE, THYROIDAB in the last 72 hours. Anemia Panel: No results for input(s): VITAMINB12, FOLATE, FERRITIN, TIBC, IRON, RETICCTPCT in the last 72 hours. Sepsis Labs: No results for input(s): PROCALCITON, LATICACIDVEN in the last 168 hours.  Recent Results (from the past 240 hour(s))  Culture, blood (Routine x 2)     Status: None   Collection Time: 03/28/19  5:20 AM   Specimen: BLOOD LEFT ARM  Result Value Ref Range Status   Specimen  Description BLOOD LEFT ARM  Final   Special Requests   Final    BOTTLES DRAWN AEROBIC AND ANAEROBIC Blood Culture adequate volume   Culture   Final    NO GROWTH 5 DAYS Performed at [facility], [address], [place], [place] [address]    Report Status 04/02/2019 FINAL  Final  Culture, blood (Routine x 2)     Status: None   Collection Time: 03/28/19  5:30 AM   Specimen: BLOOD LEFT ARM  Result Value Ref Range Status   Specimen Description BLOOD LEFT ARM  Final   Special Requests   Final    BOTTLES DRAWN AEROBIC AND ANAEROBIC Blood Culture results may not be optimal due to an excessive volume of blood received in culture bottles   Culture   Final    NO GROWTH 5 DAYS Performed at [facility], [place] [address], [place], [place] [address]    Report Status 04/02/2019 FINAL  Final  SARS Coronavirus 2 [facility] order, Performed in [facility] hospital lab) Nasopharyngeal Nasopharyngeal Swab     Status: None   Collection Time: 03/28/19  6:05 AM   Specimen: Nasopharyngeal Swab  Result Value Ref Range Status   SARS Coronavirus 2 NEGATIVE NEGATIVE Final    Comment: (NOTE) If result is NEGATIVE SARS-CoV-2 target nucleic acids are NOT DETECTED. The SARS-CoV-2 RNA is generally detectable in upper and lower  respiratory specimens during the acute phase of infection. The lowest  concentration of SARS-CoV-2 viral copies this assay can detect is 250  copies / mL. A negative result does not preclude SARS-CoV-2 infection  and should not be used as the sole basis for treatment or other  patient management decisions.  A negative result may occur with  improper specimen collection / handling, submission of specimen other  than nasopharyngeal swab, presence of viral mutation(s) within the  areas targeted by this assay, and inadequate number of viral copies  (<250 copies / mL). A negative result must be combined with clinical  observations, patient history, and epidemiological information. If result is POSITIVE SARS-CoV-2 target nucleic acids are DETECTED. The SARS-CoV-2 RNA is generally detectable in upper and lower  respiratory specimens dur ing the acute phase of infection.  Positive  results are indicative of active infection with SARS-CoV-2.  Clinical  correlation with patient history and other diagnostic information is  necessary to determine patient infection status.  Positive results do  not rule out bacterial infection or co-infection with other viruses. If result is PRESUMPTIVE POSTIVE SARS-CoV-2 nucleic acids MAY BE PRESENT.   A presumptive positive result was obtained on the submitted specimen  and confirmed on repeat testing.  While 2019 novel coronavirus  (SARS-CoV-2)  nucleic acids may be present in the submitted sample  additional confirmatory testing may be necessary for epidemiological  and / or clinical management purposes  to differentiate between  SARS-CoV-2 and other Sarbecovirus currently known to infect humans.  If clinically indicated additional testing with an alternate test  methodology [phone]) is advised. The SARS-CoV-2 RNA is generally  detectable in upper and lower respiratory sp ecimens during the acute  phase of infection. The expected result is Negative. Fact Sheet for Patients:  StrictlyIdeas.no Fact Sheet for Healthcare Providers: BankingDealers.co.za This test is not yet approved or cleared by the [place] FDA and has been authorized for detection and/or diagnosis of SARS-CoV-2 by FDA under an Emergency Use Authorization (EUA).  This EUA will remain in effect (meaning this  test can be used) for the duration of the COVID-19 declaration under Section 564(b)(1) of the Act, 21 U.S.C. section 360bbb-3(b)(1), unless the authorization is terminated or revoked sooner. Performed at [facility], [place] [address], [place], [place] [address]   C difficile quick scan w PCR reflex     Status: None   Collection Time: 04/02/19  6:35 AM   Specimen: STOOL  Result Value Ref Range Status   C Diff antigen NEGATIVE NEGATIVE Final   C Diff toxin NEGATIVE NEGATIVE Final   C Diff interpretation No C. difficile detected.  Final    Comment: Performed at [facility], [place] [address], [place], [place] [address]  Novel Coronavirus, NAA (hospital order; send-out to ref lab)     Status: None   Collection Time: 04/02/19  3:38 PM   Specimen: Nasopharyngeal Swab; Respiratory  Result Value Ref Range Status   SARS-CoV-2, NAA NOT DETECTED NOT DETECTED Final    Comment: (NOTE) This nucleic acid amplification test was developed and its performance characteristics determined by [employer]. Nucleic acid amplification tests include PCR and TMA. This test has not been FDA cleared or approved. This test has been authorized by FDA under an Emergency Use Authorization (EUA). This test is only authorized for the duration of time the declaration that circumstances exist justifying the authorization of the emergency use of in vitro diagnostic tests for detection of SARS-CoV-2 virus and/or diagnosis of COVID-19 infection under section 564(b)(1) of the Act, 21 U.S.C. [id number]) (1), unless the authorization is terminated or revoked sooner. When diagnostic testing is negative, the possibility of a false negative result should be considered in the context of a patient's recent exposures and the presence of clinical signs and symptoms consistent with COVID-19. An individual without symptoms of COVID- 19 and who is not shedding SARS-CoV-2 vi rus would expect to have a negative (not detected) result in this assay. Performed At: [facility] [address] [place], [place] [id number] Rush Farmer MD [id number]    [place]  Final    Comment: Performed at [facility], [place] [address], [place], [place] [address]         Radiology Studies: No results found.      Scheduled Meds: . atorvastatin  40 mg Oral Q2000  . bethanechol  25 mg Oral TID  . Chlorhexidine Gluconate Cloth  6 each Topical Q0600  . citalopram  40 mg Oral Daily  . darbepoetin (ARANESP) injection - DIALYSIS  150 mcg Intravenous Q Wed-HD  . feeding supplement (NEPRO CARB STEADY)  237 mL Oral Q24H  . feeding supplement (PRO-STAT SUGAR FREE 64)  30 mL Oral BID  . fentaNYL  1 patch Transdermal Q72H  . gabapentin  300 mg Oral QHS  . heparin  5,000 Units Subcutaneous Q8H  . Melatonin  4.5 mg Oral QHS  . multivitamin  1 tablet Oral QHS  . pantoprazole  40 mg Oral Daily  . potassium chloride  20 mEq Oral BID  . tamsulosin  0.4 mg Oral QPC breakfast    Continuous Infusions: . ceFEPime (MAXIPIME) IV 2 g (04/03/19 1731)  . sodium thiosulfate infusion for calciphylaxis Stopped (04/03/19 1239)     LOS: 8 days   Time spent= 35 mins    Tylyn Stankovich Arsenio Loader, MD [facility]  If 7PM-7AM, please contact night-coverage [email or web address] 04/05/2019, 11:40 AM

## 2019-04-05 NOTE — Care Management Important Message (Signed)
Important Message  Patient Details  Name: Michael Riggs MRN: [id number] Date of Birth: 1971-06-14   Medicare Important Message Given:  Yes     Orbie Pyo 04/05/2019, 12:37 PM

## 2019-04-06 LAB — BASIC METABOLIC PANEL
Anion gap: 20 — ABNORMAL HIGH (ref 5–15)
BUN: 30 mg/dL — ABNORMAL HIGH (ref 6–20)
CO2: 20 mmol/L — ABNORMAL LOW (ref 22–32)
Calcium: 8.6 mg/dL — ABNORMAL LOW (ref 8.9–10.3)
Chloride: 96 mmol/L — ABNORMAL LOW (ref 98–111)
Creatinine, Ser: 5.19 mg/dL — ABNORMAL HIGH (ref 0.61–1.24)
GFR calc Af Amer: 14 mL/min — ABNORMAL LOW (ref >60)
GFR calc non Af Amer: 12 mL/min — ABNORMAL LOW (ref >60)
Glucose, Bld: 113 mg/dL — ABNORMAL HIGH (ref 70–99)
Potassium: 3.6 mmol/L (ref 3.5–5.1)
Sodium: 136 mmol/L (ref 135–145)

## 2019-04-06 LAB — MAGNESIUM: Magnesium: 1.9 mg/dL (ref 1.7–2.4)

## 2019-04-06 LAB — CBC
HCT: 25.5 % — ABNORMAL LOW (ref 39.0–52.0)
Hemoglobin: 8.1 g/dL — ABNORMAL LOW (ref 13.0–17.0)
MCH: 27.6 pg (ref 26.0–34.0)
MCHC: 31.8 g/dL (ref 30.0–36.0)
MCV: 86.7 fL (ref 80.0–100.0)
Platelets: 335 10*3/uL (ref 150–400)
RBC: 2.94 MIL/uL — ABNORMAL LOW (ref 4.22–5.81)
RDW: 15.9 % — ABNORMAL HIGH (ref 11.5–15.5)
WBC: 16.8 10*3/uL — ABNORMAL HIGH (ref 4.0–10.5)
nRBC: 0 % (ref 0.0–0.2)

## 2019-04-06 MED ORDER — MORPHINE SULFATE (PF) 2 MG/ML IV SOLN
INTRAVENOUS | Status: AC
  Filled 2019-04-06: qty 1

## 2019-04-06 MED ORDER — POLYETHYLENE GLYCOL 3350 17 G PO PACK: 17.0000 g | PACK | Freq: Every day | ORAL | Status: DC | PRN

## 2019-04-06 MED ORDER — FERRIC CITRATE 1 GM 210 MG(FE) PO TABS
420.0000 mg | ORAL_TABLET | Freq: Three times a day (TID) | ORAL | Status: DC
  Administered 2019-04-06 – 2019-04-26 (×50): 420 mg via ORAL
  Filled 2019-04-06 (×21): qty 2
  Filled 2019-04-06: qty 1
  Filled 2019-04-06 (×46): qty 2

## 2019-04-06 MED ORDER — HEPARIN SODIUM (PORCINE) 1000 UNIT/ML IJ SOLN
INTRAMUSCULAR | Status: AC
  Filled 2019-04-06: qty 4

## 2019-04-06 MED ORDER — CHLORHEXIDINE GLUCONATE 4 % EX LIQD
60.0000 mL | Freq: Once | CUTANEOUS | Status: DC
  Filled 2019-04-06: qty 60

## 2019-04-06 MED ORDER — HYDROXYZINE HCL 25 MG PO TABS
25.0000 mg | ORAL_TABLET | Freq: Once | ORAL | Status: AC
  Administered 2019-04-06: 25 mg via ORAL

## 2019-04-06 MED ORDER — HYDROXYZINE HCL 25 MG PO TABS
ORAL_TABLET | ORAL | Status: AC
  Filled 2019-04-06: qty 1

## 2019-04-06 MED ORDER — SENNOSIDES-DOCUSATE SODIUM 8.6-50 MG PO TABS: 2.0000 | ORAL_TABLET | Freq: Every evening | ORAL | Status: DC | PRN

## 2019-04-06 NOTE — Progress Notes (Signed)
Patient has refused to go on CPAP at this time.  He stated that he wants to wait awhile.  I let patient know to have RN to call me when he is ready to go on.  No distress noted at this time.  Will continue to monitor.

## 2019-04-06 NOTE — Progress Notes (Addendum)
Subjective:  Co some  Leg discomfort  And customer service cos'  Objective Vital signs in last 24 hours: Vitals:   04/05/19 1611 04/05/19 2059 04/05/19 2224 04/06/19 0627  BP: 110/74 113/76  116/68  Pulse: 80 82 84 88  Resp: 18 18 16 18   Temp: 98.6 F (37 C) 99.3 F (37.4 C)  98.6 F (37 C)  TempSrc: Oral Oral  Oral  SpO2: 96% 95% 96% 97%  Weight:      Height:       Weight change: 1.3 kg   Physical Exam: General:Alert, Chronically ill appearing Male, NAD  Heart:RRR no rub Lungs:CTA Anter. , non labored Abdomen:obese, soft, NT, ND With some abd wall edema resolving  / rectal tube  present  Extremities:R LE Woundsblack indurated ulcerations/dryeschar with edema trace/ Left BKA No stump edema , Left inner thigh hard dark Eschar wound . Some bilat thigh edema /  Dialysis Access:R IJ Perm cath / LUA AVF + bruit (developing  )  Dialysis Orders: [facility] MWF 4 hr EDW 120 400/800 left upper AVF maturing and right IJ TDC heaprin 4K venofer 100 x 2 more doses Micrea 100 q 2 wk last 8/.26 calcitriol 0.25  Recent labs: hgb 7.7 14% sat iPTH 258 Ca/P ok  Problem/Plan: 1. RLE wounds and left thigh wounds cw Calciphylaxis - prior path from punch bx 8/5 showed necrotic tissue with acute inflammation. Seen by ortho -Plastics saw 9/11 -  And Dr DUDA 9/15 consulted  possible  Debridement of ulcers  But . Leg lesions looks very much like calciphylaxis. We have started patient on IV Na thio tiw w/ HD. Cont to avoid all Ca/ Vit D products, and use low Ca (2.0) bath. Avoid surgical I&D unless obvious secondary infection. Pain control is important and can be difficult. Extra dialysis is recommended as well, as long as pt can tolerate currently q day hd until this sat then 4 days hd stating next week / Havestarted  Na thiosulfate with HD to limit further calcific uremic arteriopathy by avoiding calcitriol/Ca containing binders also increase to daily hd  To lower ca load  //BC no  growthcurrerntly on/Maxepime with UTI  ;recent Vanco /flagyl; based on appearance of wounds and severity of pain,   2. ESRD- Usual MWF - but for Calciphylaxis RX =daily hd as above and then 4x/week starting  9/21 / because of need for Dialysis bath lowest ca / has needed now  to add po k replacement  kdur 20 bid (2/2 hd bath with low ca /low k )t  Am K 3.6  3. Hypertension/volume- as op  high IDWG/ with anasarca on admit  - no BP meds - net UF 4 L Friday /and 3.9 l9/14  Today bp dropping some=? ? Getting closer to new lower edw Vs pain med effect /back off uf today as he will have serial hd this week  - re-establish EDW at d/c  4. Anemia- hgb8.2 >7.9 >8.1 wastransfused 1 unit PRBC 9/9 -d/c on 100 Mircera - last dose 8/26 - ^ Aranesp to 150q Wed- suspect some ESA resistance due to inflammation 5. Metabolic bone disease- no calciphylaxis on bx, but wounds similar to that; holding calcitriol for now/ iPTH not high but wound examand degree of pain mimic calciphylaxis- see above plans phos 6.4  No binder ,add Auryxia as binder  6. Nutrition- renal diet/vits/restrict fluids alb low - added prostat 7. Hx left BKA  8.Urinary retention issues-  uti Enterococcus  facialis  On  antib ,d/c on flomax and low dose urecholine - w/ instruction to f/u with urology after last d/c 9. Disp - return to SNF vs homeBUT will need rehab probably    Ernest Haber, PA-C [place] [phone] 04/06/2019,10:16 AM  LOS: 9 days   Pt seen, examined and agree w A/P as above.  Kelly Splinter  MD 04/06/2019, 1:44 PM    Labs: Basic Metabolic Panel: Recent Labs  Lab 04/03/19 0934 04/04/19 0504 04/05/19 0459 04/06/19 0404  NA 130* 135 132* 136  K 3.3* 3.2* 3.8 3.6  CL 93* 94* 96* 96*  CO2 23 21* 18* 20*  GLUCOSE 153* 125* 138* 113*  BUN 57* 37* 50* 30*  CREATININE 8.38* 5.98* 7.34* 5.19*  CALCIUM 8.2* 8.7* 8.7* 8.6*  PHOS 6.4*  --   --   --    Liver Function  Tests: Recent Labs  Lab 03/31/19 0537 04/01/19 0523 04/03/19 0934  AST 16 17  --   ALT 14 12  --   ALKPHOS 93 100  --   BILITOT 0.9 0.9  --   PROT 8.2* 8.5*  --   ALBUMIN 1.9* 1.9* 1.8*   No results for input(s): LIPASE, AMYLASE in the last 168 hours. No results for input(s): AMMONIA in the last 168 hours. CBC: Recent Labs  Lab 04/02/19 0401 04/03/19 0447 04/04/19 0504 04/05/19 0459 04/06/19 0404  WBC 16.4* 18.1* 20.3* 19.4* 16.8*  NEUTROABS 11.2* 12.6* 12.9*  --   --   HGB 8.4* 8.2* 8.2* 7.9* 8.1*  HCT 28.4* 27.5* 26.0* 26.1* 25.5*  MCV 90.7 90.8 88.1 88.8 86.7  PLT 350 342 361 372 335   Cardiac Enzymes: No results for input(s): CKTOTAL, CKMB, CKMBINDEX, TROPONINI in the last 168 hours. CBG: Recent Labs  Lab 04/03/19 0623 04/03/19 0839  GLUCAP 153* 138*    Studies/Results: No results found. Medications: . ceFEPime (MAXIPIME) IV 2 g (04/05/19 2104)  . sodium thiosulfate infusion for calciphylaxis 25 g (04/05/19 1149)   . atorvastatin  40 mg Oral Q2000  . bethanechol  25 mg Oral TID  . Chlorhexidine Gluconate Cloth  6 each Topical Q0600  . citalopram  40 mg Oral Daily  . darbepoetin (ARANESP) injection - DIALYSIS  150 mcg Intravenous Q Wed-HD  . feeding supplement (NEPRO CARB STEADY)  237 mL Oral Q24H  . feeding supplement (PRO-STAT SUGAR FREE 64)  30 mL Oral BID  . fentaNYL  1 patch Transdermal Q72H  . gabapentin  300 mg Oral QHS  . heparin  5,000 Units Subcutaneous Q8H  . Melatonin  4.5 mg Oral QHS  . multivitamin  1 tablet Oral QHS  . pantoprazole  40 mg Oral Daily  . potassium chloride  20 mEq Oral BID  . tamsulosin  0.4 mg Oral QPC breakfast

## 2019-04-06 NOTE — Plan of Care (Signed)
  Problem: Education: Goal: Knowledge of General Education information will improve Description Including pain rating scale, medication(s)/side effects and non-pharmacologic comfort measures Outcome: Progressing   

## 2019-04-06 NOTE — Progress Notes (Signed)
PROGRESS NOTE    Michael Riggs  [id number] DOB: [birth year] DOA: 03/28/2019 PCP: [name], MD   Brief Narrative:  48 year old with history of ESRD on TTS HD, hypertension hyperlipidemia sent from SNF for evaluation of fever, confusion right lower extremity pain concerning for infection.  Within last 2 months he had been admitting in the hospital for worsening of renal function and fluid overload.  Started on dialysis and eventually sent to rehab.  This time admitted for sepsis secondary to right lower extremity cellulitis with concerns for necrotic tissue.  Orthopedic and plastic surgery consulted who recommended medical management and have Dr. Sharol Given follow-up once he returns from his vacation.  Surgical plans per orthopedic and nephrology   Assessment & Plan:   Principal Problem:   Wound infection Active Problems:   Hypertension   ESRD (end stage renal disease) on dialysis (HCC)   Dyslipidemia   Diabetes mellitus with peripheral vascular disease (HCC)   Depression   Physical deconditioning   Sepsis secondary to right lower extremity cellulitis with some pruritus Chronic Eschar Acute calciphylaxis with pain - Concern for low-grade infection, on cefepime. - Orthopedic following.  Seen by plastic surgery -Surgical plan to be discussed with an orthopedic and nephrology -Pain control, bowel regimen. -Fentanyl patch, oxycodone IR, IV morphine  ESRD on hemodialysis -Nephrology team following.  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  Hyperlipidemia - On Lipitor 40 mg daily  History of recent syphilis treated with penicillin  Diabetes mellitus type 2 Peripheral neuropathy -Recent hemoglobin A1c 5.0. -Insulin sliding scale.  Continue gabapentin  DVT prophylaxis: Subcutaneous heparin Code Status: Full code Family Communication: None at bedside Disposition Plan: Maintain  hospital stay for aggressive diuresis for calciphylaxis  Consultants:   Plastic surgery  Orthopedic  Procedures:   None  Antimicrobials:   Cefepime   Subjective: Still complaining of right lower extremity pain with better.  This morning when I saw him he complained of thirst and demanded to drink water doing allow any further treatment.  I gave him couple glasses of water and he felt better.  Review of Systems Otherwise negative except as per HPI, including: General = no fevers, chills, dizziness, malaise, fatigue HEENT/EYES = negative for pain, redness, loss of vision, double vision, blurred vision, loss of hearing, sore throat, hoarseness, dysphagia Cardiovascular= negative for chest pain, palpitation, murmurs, lower extremity swelling Respiratory/lungs= negative for shortness of breath, cough, hemoptysis, wheezing, mucus production Gastrointestinal= negative for nausea, vomiting,, abdominal pain, melena, hematemesis Genitourinary= negative for Dysuria, Hematuria, Change in Urinary Frequency MSK = Negative for arthralgia, myalgias, Back Pain, Joint swelling  Neurology= Negative for headache, seizures, numbness, tingling  Psychiatry= Negative for anxiety, depression, suicidal and homocidal ideation Allergy/Immunology= Medication/Food allergy as listed  Skin= Negative for Rash, lesions, ulcers, itching  Objective: Vitals:   04/06/19 1015 04/06/19 1238 04/06/19 1244 04/06/19 1300  BP: 109/74 113/71 118/80 108/70  Pulse: 83 83 82 79  Resp: 18 16    Temp: 99.1 F (37.3 C) 98.9 F (37.2 C)    TempSrc: Oral Oral    SpO2: 95% 96%    Weight:  113.7 kg    Height:        Intake/Output Summary (Last 24 hours) at 04/06/2019 1342 Last data filed at 04/06/2019 1224 Gross per 24 hour  Intake 1831.28 ml  Output 245 ml  Net 1586.28 ml   Filed Weights   04/05/19 0746 04/05/19  1222 04/06/19 1238  Weight: 111.8 kg 110.6 kg 113.7 kg    Examination:  Constitutional: NAD, calm,  comfortable Eyes: PERRL, lids and conjunctivae normal ENMT: Mucous membranes are moist. Posterior pharynx clear of any exudate or lesions.Normal dentition.  Neck: normal, supple, no masses, no thyromegaly Respiratory: clear to auscultation bilaterally, no wheezing, no crackles. Normal respiratory effort. No accessory muscle use.  Cardiovascular: Regular rate and rhythm, no murmurs / rubs / gallops. No extremity edema. 2+ pedal pulses. No carotid bruits.  Abdomen: no tenderness, no masses palpated. No hepatosplenomegaly. Bowel sounds positive.  Musculoskeletal: no clubbing / cyanosis. No joint deformity upper and lower extremities. Good ROM, no contractures. Normal muscle tone.  Skin: Left-sided BKA right lower extremity dressing in place Neurologic: CN 2-12 grossly intact. Sensation intact, DTR normal.  Psychiatric: Normal judgment and insight. Alert and oriented x 3. Normal mood.   Hemodialysis catheter noted in his right neck Left upper extremity AVF  Data Reviewed:   CBC: Recent Labs  Lab 03/31/19 0537 04/01/19 0523 04/02/19 0401 04/03/19 0447 04/04/19 0504 04/05/19 0459 04/06/19 0404  WBC 12.0* 13.5* 16.4* 18.1* 20.3* 19.4* 16.8*  NEUTROABS 7.9* 8.4* 11.2* 12.6* 12.9*  --   --   HGB 7.4* 8.1* 8.4* 8.2* 8.2* 7.9* 8.1*  HCT 24.5* 27.0* 28.4* 27.5* 26.0* 26.1* 25.5*  MCV 90.4 90.3 90.7 90.8 88.1 88.8 86.7  PLT 325 308 350 342 361 372 [id number]   Basic Metabolic Panel: Recent Labs  Lab 04/01/19 0523 04/03/19 0934 04/04/19 0504 04/05/19 0459 04/06/19 0404  NA 130* 130* 135 132* 136  K 3.2* 3.3* 3.2* 3.8 3.6  CL 95* 93* 94* 96* 96*  CO2 24 23 21* 18* 20*  GLUCOSE 110* 153* 125* 138* 113*  BUN 25* 57* 37* 50* 30*  CREATININE 4.87* 8.38* 5.98* 7.34* 5.19*  CALCIUM 8.0* 8.2* 8.7* 8.7* 8.6*  MG  --   --  1.8 2.0 1.9  PHOS  --  6.4*  --   --   --    GFR: Estimated Creatinine Clearance: 22.2 mL/min (A) (by C-G formula based on SCr of 5.19 mg/dL (H)). Liver Function  Tests: Recent Labs  Lab 03/31/19 0537 04/01/19 0523 04/03/19 0934  AST 16 17  --   ALT 14 12  --   ALKPHOS 93 100  --   BILITOT 0.9 0.9  --   PROT 8.2* 8.5*  --   ALBUMIN 1.9* 1.9* 1.8*   No results for input(s): LIPASE, AMYLASE in the last 168 hours. No results for input(s): AMMONIA in the last 168 hours. Coagulation Profile: No results for input(s): INR, PROTIME in the last 168 hours. Cardiac Enzymes: No results for input(s): CKTOTAL, CKMB, CKMBINDEX, TROPONINI in the last 168 hours. BNP (last 3 results) No results for input(s): PROBNP in the last 8760 hours. HbA1C: No results for input(s): HGBA1C in the last 72 hours. CBG: Recent Labs  Lab 04/03/19 0623 04/03/19 0839  GLUCAP 153* 138*   Lipid Profile: No results for input(s): CHOL, HDL, LDLCALC, TRIG, CHOLHDL, LDLDIRECT in the last 72 hours. Thyroid Function Tests: No results for input(s): TSH, T4TOTAL, FREET4, T3FREE, THYROIDAB in the last 72 hours. Anemia Panel: No results for input(s): VITAMINB12, FOLATE, FERRITIN, TIBC, IRON, RETICCTPCT in the last 72 hours. Sepsis Labs: No results for input(s): PROCALCITON, LATICACIDVEN in the last 168 hours.  Recent Results (from the past 240 hour(s))  Culture, blood (Routine x 2)     Status: None   Collection Time: 03/28/19  5:20 AM   Specimen: BLOOD LEFT ARM  Result Value Ref Range Status   Specimen Description BLOOD LEFT ARM  Final   Special Requests   Final    BOTTLES DRAWN AEROBIC AND ANAEROBIC Blood Culture adequate volume   Culture   Final    NO GROWTH 5 DAYS Performed at [facility], [address] [place], [place] [address]    Report Status 04/02/2019 FINAL  Final  Culture, blood (Routine x 2)     Status: None   Collection Time: 03/28/19  5:30 AM   Specimen: BLOOD LEFT ARM  Result Value Ref Range Status   Specimen Description BLOOD LEFT ARM  Final   Special Requests   Final    BOTTLES DRAWN AEROBIC AND ANAEROBIC Blood Culture results may not be  optimal due to an excessive volume of blood received in culture bottles   Culture   Final    NO GROWTH 5 DAYS Performed at [facility], [place] [address], [place], [place] [address]    Report Status 04/02/2019 FINAL  Final  SARS Coronavirus 2 [facility] order, Performed in [facility] hospital lab) Nasopharyngeal Nasopharyngeal Swab     Status: None   Collection Time: 03/28/19  6:05 AM   Specimen: Nasopharyngeal Swab  Result Value Ref Range Status   SARS Coronavirus 2 NEGATIVE NEGATIVE Final    Comment: (NOTE) If result is NEGATIVE SARS-CoV-2 target nucleic acids are NOT DETECTED. The SARS-CoV-2 RNA is generally detectable in upper and lower  respiratory specimens during the acute phase of infection. The lowest  concentration of SARS-CoV-2 viral copies this assay can detect is 250  copies / mL. A negative result does not preclude SARS-CoV-2 infection  and should not be used as the sole basis for treatment or other  patient management decisions.  A negative result may occur with  improper specimen collection / handling, submission of specimen other  than nasopharyngeal swab, presence of viral mutation(s) within the  areas targeted by this assay, and inadequate number of viral copies  (<250 copies / mL). A negative result must be combined with clinical  observations, patient history, and epidemiological information. If result is POSITIVE SARS-CoV-2 target nucleic acids are DETECTED. The SARS-CoV-2 RNA is generally detectable in upper and lower  respiratory specimens dur ing the acute phase of infection.  Positive  results are indicative of active infection with SARS-CoV-2.  Clinical  correlation with patient history and other diagnostic information is  necessary to determine patient infection status.  Positive results do  not rule out bacterial infection or co-infection with other viruses. If result is PRESUMPTIVE POSTIVE SARS-CoV-2 nucleic acids MAY BE PRESENT.   A presumptive  positive result was obtained on the submitted specimen  and confirmed on repeat testing.  While 2019 novel coronavirus  (SARS-CoV-2) nucleic acids may be present in the submitted sample  additional confirmatory testing may be necessary for epidemiological  and / or clinical management purposes  to differentiate between  SARS-CoV-2 and other Sarbecovirus currently known to infect humans.  If clinically indicated additional testing with an alternate test  methodology [phone]) is advised. The SARS-CoV-2 RNA is generally  detectable in upper and lower respiratory sp ecimens during the acute  phase of infection. The expected result is Negative. Fact Sheet for Patients:  StrictlyIdeas.no Fact Sheet for Healthcare Providers: BankingDealers.co.za This test is not yet approved or cleared by the [place] FDA and has been authorized for detection and/or diagnosis of SARS-CoV-2  by FDA under an Emergency Use Authorization (EUA).  This EUA will remain in effect (meaning this test can be used) for the duration of the COVID-19 declaration under Section 564(b)(1) of the Act, 21 U.S.C. section 360bbb-3(b)(1), unless the authorization is terminated or revoked sooner. Performed at [facility], [place] [address] [place], [place] [address]   C difficile quick scan w PCR reflex     Status: None   Collection Time: 04/02/19  6:35 AM   Specimen: STOOL  Result Value Ref Range Status   C Diff antigen NEGATIVE NEGATIVE Final   C Diff toxin NEGATIVE NEGATIVE Final   C Diff interpretation No C. difficile detected.  Final    Comment: Performed at [facility], [place] [address], [place], [place] [address]  Novel Coronavirus, NAA (hospital order; send-out to ref lab)     Status: None   Collection Time: 04/02/19  3:38 PM   Specimen: Nasopharyngeal Swab; Respiratory  Result Value Ref Range Status   SARS-CoV-2, NAA NOT DETECTED NOT DETECTED Final     Comment: (NOTE) This nucleic acid amplification test was developed and its performance characteristics determined by [employer]. Nucleic acid amplification tests include PCR and TMA. This test has not been FDA cleared or approved. This test has been authorized by FDA under an Emergency Use Authorization (EUA). This test is only authorized for the duration of time the declaration that circumstances exist justifying the authorization of the emergency use of in vitro diagnostic tests for detection of SARS-CoV-2 virus and/or diagnosis of COVID-19 infection under section 564(b)(1) of the Act, 21 U.S.C. [id number]) (1), unless the authorization is terminated or revoked sooner. When diagnostic testing is negative, the possibility of a false negative result should be considered in the context of a patient's recent exposures and the presence of clinical signs and symptoms consistent with COVID-19. An individual without symptoms of COVID- 19 and who is not shedding SARS-CoV-2 vi rus would expect to have a negative (not detected) result in this assay. Performed At: [facility] [address] [place], [place] [id number] Rush Farmer MD [id number]    [place]  Final    Comment: Performed at [facility], [place] [address], [place], [place] [address]         Radiology Studies: No results found.      Scheduled Meds: . atorvastatin  40 mg Oral Q2000  . bethanechol  25 mg Oral TID  . chlorhexidine  60 mL Topical Once  . Chlorhexidine Gluconate Cloth  6 each Topical Q0600  . citalopram  40 mg Oral Daily  . darbepoetin (ARANESP) injection - DIALYSIS  150 mcg Intravenous Q Wed-HD  . feeding supplement (NEPRO CARB STEADY)  237 mL Oral Q24H  . feeding supplement (PRO-STAT SUGAR FREE 64)  30 mL Oral BID  . fentaNYL  1 patch Transdermal Q72H  . ferric citrate  420 mg Oral TID WC  . gabapentin  300 mg Oral QHS  . heparin  5,000 Units  Subcutaneous Q8H  . Melatonin  4.5 mg Oral QHS  . multivitamin  1 tablet Oral QHS  . pantoprazole  40 mg Oral Daily  . potassium chloride  20 mEq Oral BID  . tamsulosin  0.4 mg Oral QPC breakfast   Continuous Infusions: . ceFEPime (MAXIPIME) IV 2 g (04/05/19 2104)  . sodium thiosulfate infusion for calciphylaxis 25 g (04/05/19 1149)     LOS: 9 days   Time spent= 35 mins  Dejan Angert Arsenio Loader, MD [facility]  If 7PM-7AM, please contact night-coverage [email or web address] 04/06/2019, 1:42 PM

## 2019-04-06 NOTE — Progress Notes (Signed)
Bedside shift report, fentanyl patch intact to RUE

## 2019-04-07 ENCOUNTER — Encounter (HOSPITAL_COMMUNITY)
Admission: EM | Disposition: A | Discharge status: Skilled Nursing Facility | Payer: Self-pay | Source: Skilled Nursing Facility | Attending: Internal Medicine

## 2019-04-07 LAB — BASIC METABOLIC PANEL
Anion gap: 13 (ref 5–15)
BUN: 20 mg/dL (ref 6–20)
CO2: 25 mmol/L (ref 22–32)
Calcium: 8.6 mg/dL — ABNORMAL LOW (ref 8.9–10.3)
Chloride: 96 mmol/L — ABNORMAL LOW (ref 98–111)
Creatinine, Ser: 4.28 mg/dL — ABNORMAL HIGH (ref 0.61–1.24)
GFR calc Af Amer: 18 mL/min — ABNORMAL LOW (ref >60)
GFR calc non Af Amer: 15 mL/min — ABNORMAL LOW (ref >60)
Glucose, Bld: 121 mg/dL — ABNORMAL HIGH (ref 70–99)
Potassium: 3.6 mmol/L (ref 3.5–5.1)
Sodium: 134 mmol/L — ABNORMAL LOW (ref 135–145)

## 2019-04-07 LAB — CBC
HCT: 26.2 % — ABNORMAL LOW (ref 39.0–52.0)
Hemoglobin: 8.1 g/dL — ABNORMAL LOW (ref 13.0–17.0)
MCH: 27.7 pg (ref 26.0–34.0)
MCHC: 30.9 g/dL (ref 30.0–36.0)
MCV: 89.7 fL (ref 80.0–100.0)
Platelets: 332 10*3/uL (ref 150–400)
RBC: 2.92 MIL/uL — ABNORMAL LOW (ref 4.22–5.81)
RDW: 15.8 % — ABNORMAL HIGH (ref 11.5–15.5)
WBC: 13.6 10*3/uL — ABNORMAL HIGH (ref 4.0–10.5)
nRBC: 0 % (ref 0.0–0.2)

## 2019-04-07 LAB — MAGNESIUM: Magnesium: 1.9 mg/dL (ref 1.7–2.4)

## 2019-04-07 SURGERY — IRRIGATION AND DEBRIDEMENT EXTREMITY
Anesthesia: General | Laterality: Right

## 2019-04-07 MED ORDER — CHLORHEXIDINE GLUCONATE CLOTH 2 % EX PADS
6.0000 | MEDICATED_PAD | Freq: Every day | CUTANEOUS | Status: DC
  Administered 2019-04-08 – 2019-04-11 (×3): 6 via TOPICAL

## 2019-04-07 MED ORDER — MORPHINE SULFATE (PF) 2 MG/ML IV SOLN
INTRAVENOUS | Status: AC
  Filled 2019-04-07: qty 1

## 2019-04-07 MED ORDER — OXYCODONE HCL 5 MG PO TABS
ORAL_TABLET | ORAL | Status: AC
  Filled 2019-04-07: qty 2

## 2019-04-07 MED ORDER — HEPARIN SODIUM (PORCINE) 1000 UNIT/ML IJ SOLN
INTRAMUSCULAR | Status: AC
  Filled 2019-04-07: qty 4

## 2019-04-07 MED ORDER — HEPARIN SODIUM (PORCINE) 1000 UNIT/ML IJ SOLN
2.8000 mL | Freq: Once | INTRAMUSCULAR | Status: AC
  Administered 2019-04-07: 2800 [IU] via INTRAVENOUS

## 2019-04-07 MED ORDER — CHLORHEXIDINE GLUCONATE CLOTH 2 % EX PADS
6.0000 | MEDICATED_PAD | Freq: Every day | CUTANEOUS | Status: DC
  Administered 2019-04-07: 6 via TOPICAL

## 2019-04-07 NOTE — Progress Notes (Addendum)
[facility] Progress Note   Subjective: Seen earlier today in room. Rated pain from leg wounds 10/10. Denied SOB. Now on HD tolerating well.   Objective Vitals:   04/07/19 1155 04/07/19 1204 04/07/19 1230 04/07/19 1300  BP: 121/72 128/75 113/62 119/67  Pulse: 78 73 72 73  Resp: 16  14 15   Temp: 99.1 F (37.3 C)     TempSrc: Oral     SpO2: 96%     Weight: 112.7 kg     Height:       Physical Exam General: Chronically ill appearing male in NAD Heart: S1,S2 RRR Lungs: CTAB Abdomen: obese, active BS Extremities: Blackened ulcerations RLE, L BKA with blackened ulcerations L thigh. Exquisitely painful to touch. No RLE edema, no stump edema.  Dialysis Access: RIJ TDC Drsg-blood lines connected. CDI. L AVF + bruit   Additional Objective Labs: Basic Metabolic Panel: Recent Labs  Lab 04/03/19 0934  04/05/19 0459 04/06/19 0404 04/07/19 0402  NA 130*   < > 132* 136 134*  K 3.3*   < > 3.8 3.6 3.6  CL 93*   < > 96* 96* 96*  CO2 23   < > 18* 20* 25  GLUCOSE 153*   < > 138* 113* 121*  BUN 57*   < > 50* 30* 20  CREATININE 8.38*   < > 7.34* 5.19* 4.28*  CALCIUM 8.2*   < > 8.7* 8.6* 8.6*  PHOS 6.4*  --   --   --   --    < > = values in this interval not displayed.   Liver Function Tests: Recent Labs  Lab 04/01/19 0523 04/03/19 0934  AST 17  --   ALT 12  --   ALKPHOS 100  --   BILITOT 0.9  --   PROT 8.5*  --   ALBUMIN 1.9* 1.8*   No results for input(s): LIPASE, AMYLASE in the last 168 hours. CBC: Recent Labs  Lab 04/02/19 0401 04/03/19 0447 04/04/19 0504 04/05/19 0459 04/06/19 0404 04/07/19 0402  WBC 16.4* 18.1* 20.3* 19.4* 16.8* 13.6*  NEUTROABS 11.2* 12.6* 12.9*  --   --   --   HGB 8.4* 8.2* 8.2* 7.9* 8.1* 8.1*  HCT 28.4* 27.5* 26.0* 26.1* 25.5* 26.2*  MCV 90.7 90.8 88.1 88.8 86.7 89.7  PLT 350 342 361 372 335 332   Blood Culture    Component Value Date/Time   SDES BLOOD LEFT ARM 03/28/2019 0530   SPECREQUEST  03/28/2019 0530    BOTTLES  DRAWN AEROBIC AND ANAEROBIC Blood Culture results may not be optimal due to an excessive volume of blood received in culture bottles   CULT  03/28/2019 0530    NO GROWTH 5 DAYS Performed at [facility], [place] [address] [place], [place] [address]    REPTSTATUS 04/02/2019 FINAL 03/28/2019 0530    Cardiac Enzymes: No results for input(s): CKTOTAL, CKMB, CKMBINDEX, TROPONINI in the last 168 hours. CBG: Recent Labs  Lab 04/03/19 0623 04/03/19 0839  GLUCAP 153* 138*   Iron Studies: No results for input(s): IRON, TIBC, TRANSFERRIN, FERRITIN in the last 72 hours. @lablastinr3 @ Studies/Results: No results found. Medications: . ceFEPime (MAXIPIME) IV 2 g (04/05/19 2104)  . sodium thiosulfate infusion for calciphylaxis 25 g (04/05/19 1149)   . atorvastatin  40 mg Oral Q2000  . bethanechol  25 mg Oral TID  . chlorhexidine  60 mL Topical Once  . Chlorhexidine Gluconate Cloth  6 each Topical Q0600  . Chlorhexidine  Gluconate Cloth  6 each Topical [id number]  . citalopram  40 mg Oral Daily  . darbepoetin (ARANESP) injection - DIALYSIS  150 mcg Intravenous Q Wed-HD  . feeding supplement (NEPRO CARB STEADY)  237 mL Oral Q24H  . feeding supplement (PRO-STAT SUGAR FREE 64)  30 mL Oral BID  . fentaNYL  1 patch Transdermal Q72H  . ferric citrate  420 mg Oral TID WC  . gabapentin  300 mg Oral QHS  . heparin      . heparin  5,000 Units Subcutaneous Q8H  . Melatonin  4.5 mg Oral QHS  . multivitamin  1 tablet Oral QHS  . pantoprazole  40 mg Oral Daily  . potassium chloride  20 mEq Oral BID  . tamsulosin  0.4 mg Oral QPC breakfast   HD orders: Colon MWF 4 hrs 180NRe 400/800 120 kg 2.0 K/ 2.25 Ca  -Heparin 4000 units IV TIW -Calcitriol 0.25 mcg PO TIW  Assessment/Plan: 1. 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 Daily HD this week, low Ca+ bath, avoid VDRA and calcium based binders. Start on IV sodium thiosulfate 25 grams IV TIW. Pain control per  primary.  2. ESRD -MWF HD today and again tomorrow, then resume MWF schedule next week. Use 4.0 K/2.0 Ca bath.  3. Anemia - HGB 8.1 Rec'd Aranesp 150 mcg IV 04/05/19. Follow HGB.  4. Secondary hyperparathyroidism - Needs RFP. Avoid calcium based binders, use 2.0 Ca bath. Ca 8.6 C Ca 10.3.  5. HTN/volume - BP and volume well controlled. Now on HD. Pre wt 112.7. Well under OP EDW. Will need lower EDW on discharge. Minimal UF goal tomorrow, avoid hypotension.  6. Nutrition - renal diet, fld restrictions, prostat. 7. UTI-Enterocococcus facialis. Per primary 8. DMT2-per primary  Jimmye Norman. Brown NP-C 04/07/2019, 1:48 PM  [facility] [phone]  Pt seen, examined and agree w A/P as above.  Kelly Splinter  MD 04/07/2019, 3:12 PM

## 2019-04-07 NOTE — Plan of Care (Signed)
  Problem: Education: Goal: Knowledge of General Education information will improve Description: Including pain rating scale, medication(s)/side effects and non-pharmacologic comfort measures Outcome: Progressing   Problem: Pain Managment: Goal: General experience of comfort will improve Outcome: Progressing   

## 2019-04-07 NOTE — Progress Notes (Signed)
[name], male   DOB: 09-04-1970, 48 y.o.   MRN: [id number]   Seen for follow up of right lower leg ulcers which have worsened over the past month- Patient reports burning pains of both legs.   History of left BKA and reports burning of the residual limb.  He is taking gabapentin at bedtime and would increase if possible to TID.   The right lower leg ulcers are dark black necrotic ulcers with scant drainage, some odor of necrosis noted.Palpable dorsalis pedis pulse.  He has black necrotic firm areas of bilateral thighs as well.   The leg ulcers are felt to be consistent with calciphylaxis per renal and  treating with Na thiosulfate in HD.  No plans for debridement currently unless develops obvious infection as noted.   Will follow at a distance. Please re-consult prn for worsening/poor response to conservative management as may require surgical intervention in the near future with possible need for debridement/grafting vs. transtibial amputation.  Thank you  Sadler Teschner,PA-C [facility] [phone]

## 2019-04-07 NOTE — Progress Notes (Signed)
PROGRESS NOTE    Michael Riggs  [id number] DOB: 11-12-1970 DOA: 03/28/2019 PCP: [name], MD   Brief Narrative:  48 year old with history of ESRD on TTS HD, hypertension hyperlipidemia sent from SNF for evaluation of fever, confusion right lower extremity pain concerning for infection.  Within last 2 months he had been admitting in the hospital for worsening of renal function and fluid overload.  Started on dialysis and eventually sent to rehab.  This time admitted for sepsis secondary to right lower extremity cellulitis with concerns for necrotic tissue.  Orthopedic and plastic surgery consulted who recommended medical management and have Dr. Sharol Given follow-up once he returns from his vacation.  Surgical plans per orthopedic and nephrology   Assessment & Plan:   Principal Problem:   Wound infection Active Problems:   Hypertension   ESRD (end stage renal disease) on dialysis (HCC)   Dyslipidemia   Diabetes mellitus with peripheral vascular disease (HCC)   Depression   Physical deconditioning   Sepsis secondary to right lower extremity cellulitis with some pruritus improved Chronic Eschar Acute calciphylaxis with pain - Would like to stop cefepime: We will await nephrology - Orthopedic following.  Seen by plastic surgery -Surgical plan to be discussed with an orthopedic and nephrology -Pain control, bowel regimen. -Continue fentanyl patch, IV morphine and oxycodone IR  ESRD on hemodialysis -Neurology back regardless as per their service.  Currently undergoing aggressive dialysis  Essential hypertension - Not on medications besides dialysis and Lasix  Hyperlipidemia - On Lipitor 40 mg daily  History of recent syphilis treated with penicillin  Diabetes mellitus type 2 Peripheral neuropathy -Recent hemoglobin A1c 5.0. -Insulin sliding scale.  Continue gabapentin  DVT prophylaxis: Subcutaneous heparin Code Status: Full code Family Communication: None at  bedside Disposition Plan: During hospital stay for aggressive dialysis  Consultants:   Plastic surgery  Orthopedic  Procedures:   None  Antimicrobials:   Cefepime   Subjective: Still complains of right lower extremity pain but no other complaints.  Review of Systems Otherwise negative except as per HPI, including: General = no fevers, chills, dizziness, malaise, fatigue HEENT/EYES = negative for pain, redness, loss of vision, double vision, blurred vision, loss of hearing, sore throat, hoarseness, dysphagia Cardiovascular= negative for chest pain, palpitation, murmurs, lower extremity swelling Respiratory/lungs= negative for shortness of breath, cough, hemoptysis, wheezing, mucus production Gastrointestinal= negative for nausea, vomiting,, abdominal pain, melena, hematemesis Genitourinary= negative for Dysuria, Hematuria, Change in Urinary Frequency MSK = Negative for arthralgia, myalgias, Back Pain, Joint swelling  Neurology= Negative for headache, seizures, numbness, tingling  Psychiatry= Negative for anxiety, depression, suicidal and homocidal ideation Allergy/Immunology= Medication/Food allergy as listed  Skin= Negative for Rash, lesions, ulcers, itching   Objective: Vitals:   04/06/19 2147 04/07/19 0100 04/07/19 0608 04/07/19 1032  BP: 119/75  110/75 107/67  Pulse: 80 77 81 79  Resp: 19 18 18 18   Temp: 98.5 F (36.9 C)  98 F (36.7 C) 99.3 F (37.4 C)  TempSrc:   Oral Oral  SpO2: 91% 96% 92% 95%  Weight: 112.5 kg     Height:        Intake/Output Summary (Last 24 hours) at 04/07/2019 1210 Last data filed at 04/07/2019 0800 Gross per 24 hour  Intake 660 ml  Output 1000 ml  Net -340 ml   Filed Weights   04/06/19 1238 04/06/19 1620 04/06/19 2147  Weight: 113.7 kg 112.5 kg 112.5 kg    Examination:  Constitutional: NAD, calm, comfortable Eyes: PERRL, lids and  conjunctivae normal ENMT: Mucous membranes are moist. Posterior pharynx clear of any exudate  or lesions.Normal dentition.  Neck: normal, supple, no masses, no thyromegaly Respiratory: clear to auscultation bilaterally, no wheezing, no crackles. Normal respiratory effort. No accessory muscle use.  Cardiovascular: Regular rate and rhythm, no murmurs / rubs / gallops. No extremity edema. 2+ pedal pulses. No carotid bruits.  Abdomen: no tenderness, no masses palpated. No hepatosplenomegaly. Bowel sounds positive.  Musculoskeletal: Left lower extremity BKA Skin: Left lower extremity BKA, right lower extremity dressing in place Neurologic: CN 2-12 grossly intact. Sensation intact, DTR normal. Strength 5/5 in all 4.  Psychiatric: Normal judgment and insight. Alert and oriented x 3. Normal mood.    Hemodialysis catheter noted in his right neck Left upper extremity AVF  Data Reviewed:   CBC: Recent Labs  Lab 04/01/19 0523 04/02/19 0401 04/03/19 0447 04/04/19 0504 04/05/19 0459 04/06/19 0404 04/07/19 0402  WBC 13.5* 16.4* 18.1* 20.3* 19.4* 16.8* 13.6*  NEUTROABS 8.4* 11.2* 12.6* 12.9*  --   --   --   HGB 8.1* 8.4* 8.2* 8.2* 7.9* 8.1* 8.1*  HCT 27.0* 28.4* 27.5* 26.0* 26.1* 25.5* 26.2*  MCV 90.3 90.7 90.8 88.1 88.8 86.7 89.7  PLT 308 350 342 361 372 335 [id number]   Basic Metabolic Panel: Recent Labs  Lab 04/03/19 0934 04/04/19 0504 04/05/19 0459 04/06/19 0404 04/07/19 0402  NA 130* 135 132* 136 134*  K 3.3* 3.2* 3.8 3.6 3.6  CL 93* 94* 96* 96* 96*  CO2 23 21* 18* 20* 25  GLUCOSE 153* 125* 138* 113* 121*  BUN 57* 37* 50* 30* 20  CREATININE 8.38* 5.98* 7.34* 5.19* 4.28*  CALCIUM 8.2* 8.7* 8.7* 8.6* 8.6*  MG  --  1.8 2.0 1.9 1.9  PHOS 6.4*  --   --   --   --    GFR: Estimated Creatinine Clearance: 26.8 mL/min (A) (by C-G formula based on SCr of 4.28 mg/dL (H)). Liver Function Tests: Recent Labs  Lab 04/01/19 0523 04/03/19 0934  AST 17  --   ALT 12  --   ALKPHOS 100  --   BILITOT 0.9  --   PROT 8.5*  --   ALBUMIN 1.9* 1.8*   No results for input(s): LIPASE,  AMYLASE in the last 168 hours. No results for input(s): AMMONIA in the last 168 hours. Coagulation Profile: No results for input(s): INR, PROTIME in the last 168 hours. Cardiac Enzymes: No results for input(s): CKTOTAL, CKMB, CKMBINDEX, TROPONINI in the last 168 hours. BNP (last 3 results) No results for input(s): PROBNP in the last 8760 hours. HbA1C: No results for input(s): HGBA1C in the last 72 hours. CBG: Recent Labs  Lab 04/03/19 0623 04/03/19 0839  GLUCAP 153* 138*   Lipid Profile: No results for input(s): CHOL, HDL, LDLCALC, TRIG, CHOLHDL, LDLDIRECT in the last 72 hours. Thyroid Function Tests: No results for input(s): TSH, T4TOTAL, FREET4, T3FREE, THYROIDAB in the last 72 hours. Anemia Panel: No results for input(s): VITAMINB12, FOLATE, FERRITIN, TIBC, IRON, RETICCTPCT in the last 72 hours. Sepsis Labs: No results for input(s): PROCALCITON, LATICACIDVEN in the last 168 hours.  Recent Results (from the past 240 hour(s))  C difficile quick scan w PCR reflex     Status: None   Collection Time: 04/02/19  6:35 AM   Specimen: STOOL  Result Value Ref Range Status   C Diff antigen NEGATIVE NEGATIVE Final   C Diff toxin NEGATIVE NEGATIVE Final   C Diff interpretation No C. difficile detected.  Final    Comment: Performed at [facility], [place] [address], [place], [place] [address]  Novel Coronavirus, NAA (hospital order; send-out to ref lab)     Status: None   Collection Time: 04/02/19  3:38 PM   Specimen: Nasopharyngeal Swab; Respiratory  Result Value Ref Range Status   SARS-CoV-2, NAA NOT DETECTED NOT DETECTED Final    Comment: (NOTE) This nucleic acid amplification test was developed and its performance characteristics determined by [employer]. Nucleic acid amplification tests include PCR and TMA. This test has not been FDA cleared or approved. This test has been authorized by FDA under an Emergency Use Authorization (EUA). This test is only authorized  for the duration of time the declaration that circumstances exist justifying the authorization of the emergency use of in vitro diagnostic tests for detection of SARS-CoV-2 virus and/or diagnosis of COVID-19 infection under section 564(b)(1) of the Act, 21 U.S.C. [id number]) (1), unless the authorization is terminated or revoked sooner. When diagnostic testing is negative, the possibility of a false negative result should be considered in the context of a patient's recent exposures and the presence of clinical signs and symptoms consistent with COVID-19. An individual without symptoms of COVID- 19 and who is not shedding SARS-CoV-2 vi rus would expect to have a negative (not detected) result in this assay. Performed At: [facility] [address] [place], [place] [id number] Rush Farmer MD [id number]    [place]  Final    Comment: Performed at [facility], [place] [address], [place], [place] [address]         Radiology Studies: No results found.      Scheduled Meds: . atorvastatin  40 mg Oral Q2000  . bethanechol  25 mg Oral TID  . chlorhexidine  60 mL Topical Once  . Chlorhexidine Gluconate Cloth  6 each Topical Q0600  . Chlorhexidine Gluconate Cloth  6 each Topical Q0600  . citalopram  40 mg Oral Daily  . darbepoetin (ARANESP) injection - DIALYSIS  150 mcg Intravenous Q Wed-HD  . feeding supplement (NEPRO CARB STEADY)  237 mL Oral Q24H  . feeding supplement (PRO-STAT SUGAR FREE 64)  30 mL Oral BID  . fentaNYL  1 patch Transdermal Q72H  . ferric citrate  420 mg Oral TID WC  . gabapentin  300 mg Oral QHS  . heparin  5,000 Units Subcutaneous Q8H  . Melatonin  4.5 mg Oral QHS  . multivitamin  1 tablet Oral QHS  . pantoprazole  40 mg Oral Daily  . potassium chloride  20 mEq Oral BID  . tamsulosin  0.4 mg Oral QPC breakfast   Continuous Infusions: . ceFEPime (MAXIPIME) IV 2 g (04/05/19 2104)  . sodium thiosulfate infusion  for calciphylaxis 25 g (04/05/19 1149)     LOS: 10 days   Time spent= 25 mins    Chrystine Frogge Arsenio Loader, MD [facility]  If 7PM-7AM, please contact night-coverage [email or web address] 04/07/2019, 12:10 PM

## 2019-04-07 NOTE — Care Management Important Message (Signed)
Important Message  Patient Details  Name: Michael Riggs MRN: [id number] Date of Birth: 1970/12/06   Medicare Important Message Given:  Yes     Memory Argue 04/07/2019, 4:14 PM

## 2019-04-07 NOTE — Clinical Social Work Note (Signed)
Patient is from [facility] and plans to return once medically stable. CSW continuing to follow, will provide SW intervention services as needed and will facilitate discharge back to SNF once medically stable.  Oryan Winterton Givens, MSW, LCSW Licensed Clinical Social Worker [place] [phone]

## 2019-04-08 LAB — RENAL FUNCTION PANEL
Albumin: 1.7 g/dL — ABNORMAL LOW (ref 3.5–5.0)
Anion gap: 13 (ref 5–15)
BUN: 20 mg/dL (ref 6–20)
CO2: 25 mmol/L (ref 22–32)
Calcium: 8.6 mg/dL — ABNORMAL LOW (ref 8.9–10.3)
Chloride: 96 mmol/L — ABNORMAL LOW (ref 98–111)
Creatinine, Ser: 4.02 mg/dL — ABNORMAL HIGH (ref 0.61–1.24)
GFR calc Af Amer: 19 mL/min — ABNORMAL LOW (ref >60)
GFR calc non Af Amer: 17 mL/min — ABNORMAL LOW (ref >60)
Glucose, Bld: 99 mg/dL (ref 70–99)
Phosphorus: 3.4 mg/dL (ref 2.5–4.6)
Potassium: 4.2 mmol/L (ref 3.5–5.1)
Sodium: 134 mmol/L — ABNORMAL LOW (ref 135–145)

## 2019-04-08 LAB — CBC
HCT: 24.4 % — ABNORMAL LOW (ref 39.0–52.0)
Hemoglobin: 7.2 g/dL — ABNORMAL LOW (ref 13.0–17.0)
MCH: 27 pg (ref 26.0–34.0)
MCHC: 29.5 g/dL — ABNORMAL LOW (ref 30.0–36.0)
MCV: 91.4 fL (ref 80.0–100.0)
Platelets: 291 10*3/uL (ref 150–400)
RBC: 2.67 MIL/uL — ABNORMAL LOW (ref 4.22–5.81)
RDW: 15.9 % — ABNORMAL HIGH (ref 11.5–15.5)
WBC: 13 10*3/uL — ABNORMAL HIGH (ref 4.0–10.5)
nRBC: 0 % (ref 0.0–0.2)

## 2019-04-08 LAB — PREPARE RBC (CROSSMATCH)

## 2019-04-08 LAB — MAGNESIUM: Magnesium: 2 mg/dL (ref 1.7–2.4)

## 2019-04-08 MED ORDER — HEPARIN SODIUM (PORCINE) 1000 UNIT/ML DIALYSIS: 1000.0000 [IU] | INTRAMUSCULAR | Status: DC | PRN

## 2019-04-08 MED ORDER — LIDOCAINE-PRILOCAINE 2.5-2.5 % EX CREA: 1.0000 "application " | TOPICAL_CREAM | CUTANEOUS | Status: DC | PRN

## 2019-04-08 MED ORDER — HEPARIN SODIUM (PORCINE) 1000 UNIT/ML IJ SOLN
INTRAMUSCULAR | Status: AC
  Filled 2019-04-08: qty 4

## 2019-04-08 MED ORDER — ALTEPLASE 2 MG IJ SOLR: 2.0000 mg | Freq: Once | INTRAMUSCULAR | Status: DC | PRN

## 2019-04-08 MED ORDER — SODIUM CHLORIDE 0.9 % IV SOLN: 100.0000 mL | INTRAVENOUS | Status: DC | PRN

## 2019-04-08 MED ORDER — SODIUM CHLORIDE 0.9% IV SOLUTION
Freq: Once | INTRAVENOUS | Status: AC
  Administered 2019-04-08: 14:00:00 via INTRAVENOUS

## 2019-04-08 MED ORDER — PENTAFLUOROPROP-TETRAFLUOROETH EX AERO: 1.0000 "application " | INHALATION_SPRAY | CUTANEOUS | Status: DC | PRN

## 2019-04-08 MED ORDER — LIDOCAINE HCL (PF) 1 % IJ SOLN: 5.0000 mL | INTRAMUSCULAR | Status: DC | PRN

## 2019-04-08 MED ORDER — HEPARIN SODIUM (PORCINE) 1000 UNIT/ML DIALYSIS: 4000.0000 [IU] | Freq: Once | INTRAMUSCULAR | Status: DC

## 2019-04-08 NOTE — Progress Notes (Signed)
PROGRESS NOTE    Michael Riggs  [name] DOB: Jun 11, 1971 DOA: 03/28/2019 PCP: [name], MD   Brief Narrative:  48 year old with history of ESRD on TTS HD, hypertension hyperlipidemia sent from SNF for evaluation of fever, confusion right lower extremity pain concerning for infection.  Within last 2 months he had been admitting in the hospital for worsening of renal function and fluid overload.  Started on dialysis and eventually sent to rehab.  This time admitted for sepsis secondary to right lower extremity cellulitis with concerns for necrotic tissue.  Orthopedic and plastic surgery consulted who recommended medical management and have Dr. Sharol Given follow-up once he returns from his vacation.  Surgical plans per orthopedic and nephrology   Assessment & Plan:   Principal Problem:   Wound infection Active Problems:   Hypertension   ESRD (end stage renal disease) on dialysis (HCC)   Dyslipidemia   Diabetes mellitus with peripheral vascular disease (HCC)   Depression   Physical deconditioning   Sepsis secondary to right lower extremity cellulitis with some pruritus improved Chronic Eschar Acute calciphylaxis with pain - Discontinue antibiotics. - Orthopedic following.  Seen by plastic surgery -Surgical plan to be discussed with an orthopedic and nephrology -Pain control, bowel regimen. -Continue fentanyl patch, IV morphine and oxycodone IR  ESRD on hemodialysis -Ongoing dialysis per nephrology team  Essential hypertension - Not on medications besides dialysis and Lasix  Acute on chronic anemia -PRBC transfusion ordered.  No obvious signs of blood loss.  Hyperlipidemia - On Lipitor 40 mg daily  History of recent syphilis treated with penicillin  Diabetes mellitus type 2 Peripheral neuropathy -Recent hemoglobin A1c 5.0. -Insulin sliding scale.  Continue gabapentin  We will consult PT/OT for safe disposition  DVT prophylaxis: Subcutaneous heparin Code  Status: Full code Family Communication: None at bedside Disposition Plan: Maintain hospital stay for dialysis until cleared by nephrology  Consultants:   Plastic surgery  Orthopedic  Procedures:   None  Antimicrobials:   Cefepime stopped 9/19   Subjective: Still complaining of right lower extremity pain especially with movement.  Tolerating dialysis during my evaluation.  Review of Systems Otherwise negative except as per HPI, including: General = no fevers, chills, dizziness, malaise, fatigue HEENT/EYES = negative for pain, redness, loss of vision, double vision, blurred vision, loss of hearing, sore throat, hoarseness, dysphagia Cardiovascular= negative for chest pain, palpitation, murmurs, lower extremity swelling Respiratory/lungs= negative for shortness of breath, cough, hemoptysis, wheezing, mucus production Gastrointestinal= negative for nausea, vomiting,, abdominal pain, melena, hematemesis Genitourinary= negative for Dysuria, Hematuria, Change in Urinary Frequency MSK = Negative for arthralgia, myalgias Neurology= Negative for headache, seizures, numbness, tingling  Psychiatry= Negative for anxiety, depression, suicidal and homocidal ideation Allergy/Immunology= Medication/Food allergy as listed  Skin= Negative  itching   Objective: Vitals:   04/08/19 0702 04/08/19 0730 04/08/19 0800 04/08/19 0830  BP:  114/72 111/69 (!) 117/44  Pulse: 78 79 79 (!) 55  Resp:      Temp:      TempSrc:      SpO2:      Weight:      Height:        Intake/Output Summary (Last 24 hours) at 04/08/2019 1212 Last data filed at 04/08/2019 0600 Gross per 24 hour  Intake 625.69 ml  Output 350 ml  Net 275.69 ml   Filed Weights   04/07/19 1534 04/08/19 0522 04/08/19 0653  Weight: 112.7 kg 113.1 kg 113.1 kg    Examination: Constitutional: NAD, calm, comfortable Eyes: PERRL,  lids and conjunctivae normal ENMT: Mucous membranes are moist. Posterior pharynx clear of any exudate or  lesions.Normal dentition.  Neck: normal, supple, no masses, no thyromegaly Respiratory: clear to auscultation bilaterally, no wheezing, no crackles. Normal respiratory effort. No accessory muscle use.  Cardiovascular: Regular rate and rhythm, no murmurs / rubs / gallops. No extremity edema. 2+ pedal pulses. No carotid bruits.  Abdomen: no tenderness, no masses palpated. No hepatosplenomegaly. Bowel sounds positive.  Musculoskeletal: Left-sided BKA Skin: Right lower extremity dark skin/necrosis noted. Neurologic: CN 2-12 grossly intact. Sensation intact, DTR normal. Strength 4/5 in all 4.  Psychiatric: Normal judgment and insight. Alert and oriented x 3. Normal mood.     Hemodialysis catheter noted in his right neck Left upper extremity AVF  Data Reviewed:   CBC: Recent Labs  Lab 04/02/19 0401 04/03/19 0447 04/04/19 0504 04/05/19 0459 04/06/19 0404 04/07/19 0402 04/08/19 0932  WBC 16.4* 18.1* 20.3* 19.4* 16.8* 13.6* 13.0*  NEUTROABS 11.2* 12.6* 12.9*  --   --   --   --   HGB 8.4* 8.2* 8.2* 7.9* 8.1* 8.1* 7.2*  HCT 28.4* 27.5* 26.0* 26.1* 25.5* 26.2* 24.4*  MCV 90.7 90.8 88.1 88.8 86.7 89.7 91.4  PLT 350 342 361 372 335 332 [id number]   Basic Metabolic Panel: Recent Labs  Lab 04/03/19 0934 04/04/19 0504 04/05/19 0459 04/06/19 0404 04/07/19 0402 04/08/19 0932  NA 130* 135 132* 136 134* 134*  K 3.3* 3.2* 3.8 3.6 3.6 4.2  CL 93* 94* 96* 96* 96* 96*  CO2 23 21* 18* 20* 25 25  GLUCOSE 153* 125* 138* 113* 121* 99  BUN 57* 37* 50* 30* 20 20  CREATININE 8.38* 5.98* 7.34* 5.19* 4.28* 4.02*  CALCIUM 8.2* 8.7* 8.7* 8.6* 8.6* 8.6*  MG  --  1.8 2.0 1.9 1.9 2.0  PHOS 6.4*  --   --   --   --  3.4   GFR: Estimated Creatinine Clearance: 28.6 mL/min (A) (by C-G formula based on SCr of 4.02 mg/dL (H)). Liver Function Tests: Recent Labs  Lab 04/03/19 0934 04/08/19 0932  ALBUMIN 1.8* 1.7*   No results for input(s): LIPASE, AMYLASE in the last 168 hours. No results for input(s):  AMMONIA in the last 168 hours. Coagulation Profile: No results for input(s): INR, PROTIME in the last 168 hours. Cardiac Enzymes: No results for input(s): CKTOTAL, CKMB, CKMBINDEX, TROPONINI in the last 168 hours. BNP (last 3 results) No results for input(s): PROBNP in the last 8760 hours. HbA1C: No results for input(s): HGBA1C in the last 72 hours. CBG: Recent Labs  Lab 04/03/19 0623 04/03/19 0839  GLUCAP 153* 138*   Lipid Profile: No results for input(s): CHOL, HDL, LDLCALC, TRIG, CHOLHDL, LDLDIRECT in the last 72 hours. Thyroid Function Tests: No results for input(s): TSH, T4TOTAL, FREET4, T3FREE, THYROIDAB in the last 72 hours. Anemia Panel: No results for input(s): VITAMINB12, FOLATE, FERRITIN, TIBC, IRON, RETICCTPCT in the last 72 hours. Sepsis Labs: No results for input(s): PROCALCITON, LATICACIDVEN in the last 168 hours.  Recent Results (from the past 240 hour(s))  C difficile quick scan w PCR reflex     Status: None   Collection Time: 04/02/19  6:35 AM   Specimen: STOOL  Result Value Ref Range Status   C Diff antigen NEGATIVE NEGATIVE Final   C Diff toxin NEGATIVE NEGATIVE Final   C Diff interpretation No C. difficile detected.  Final    Comment: Performed at [facility], [place] [address] [place], [place] [address]  Novel Coronavirus, NAA (hospital order; send-out to ref lab)     Status: None   Collection Time: 04/02/19  3:38 PM   Specimen: Nasopharyngeal Swab; Respiratory  Result Value Ref Range Status   SARS-CoV-2, NAA NOT DETECTED NOT DETECTED Final    Comment: (NOTE) This nucleic acid amplification test was developed and its performance characteristics determined by [employer]. Nucleic acid amplification tests include PCR and TMA. This test has not been FDA cleared or approved. This test has been authorized by FDA under an Emergency Use Authorization (EUA). This test is only authorized for the duration of time the declaration  that circumstances exist justifying the authorization of the emergency use of in vitro diagnostic tests for detection of SARS-CoV-2 virus and/or diagnosis of COVID-19 infection under section 564(b)(1) of the Act, 21 U.S.C. [id number]) (1), unless the authorization is terminated or revoked sooner. When diagnostic testing is negative, the possibility of a false negative result should be considered in the context of a patient's recent exposures and the presence of clinical signs and symptoms consistent with COVID-19. An individual without symptoms of COVID- 19 and who is not shedding SARS-CoV-2 vi rus would expect to have a negative (not detected) result in this assay. Performed At: [facility] [address] [place], [place] [id number] Rush Farmer MD [id number]    [place]  Final    Comment: Performed at [facility], [place] [address] [place], [place] [address]         Radiology Studies: No results found.      Scheduled Meds: . sodium chloride   Intravenous Once  . atorvastatin  40 mg Oral Q2000  . bethanechol  25 mg Oral TID  . chlorhexidine  60 mL Topical Once  . Chlorhexidine Gluconate Cloth  6 each Topical Q0600  . Chlorhexidine Gluconate Cloth  6 each Topical Q0600  . Chlorhexidine Gluconate Cloth  6 each Topical Q0600  . citalopram  40 mg Oral Daily  . darbepoetin (ARANESP) injection - DIALYSIS  150 mcg Intravenous Q Wed-HD  . feeding supplement (NEPRO CARB STEADY)  237 mL Oral Q24H  . feeding supplement (PRO-STAT SUGAR FREE 64)  30 mL Oral BID  . fentaNYL  1 patch Transdermal Q72H  . ferric citrate  420 mg Oral TID WC  . gabapentin  300 mg Oral QHS  . heparin      . heparin  5,000 Units Subcutaneous Q8H  . Melatonin  4.5 mg Oral QHS  . multivitamin  1 tablet Oral QHS  . pantoprazole  40 mg Oral Daily  . potassium chloride  20 mEq Oral BID  . tamsulosin  0.4 mg Oral QPC breakfast   Continuous Infusions: .  ceFEPime (MAXIPIME) IV 2 g (04/07/19 1710)  . sodium thiosulfate infusion for calciphylaxis 25 g (04/07/19 1434)     LOS: 11 days   Time spent= 25 mins    Keili Hasten Arsenio Loader, MD [facility]  If 7PM-7AM, please contact night-coverage [email or web address] 04/08/2019, 12:12 PM

## 2019-04-08 NOTE — Plan of Care (Signed)
  Problem: Education: Goal: Knowledge of General Education information will improve Description Including pain rating scale, medication(s)/side effects and non-pharmacologic comfort measures Outcome: Progressing   Problem: Nutrition: Goal: Adequate nutrition will be maintained Outcome: Progressing   Problem: Pain Managment: Goal: General experience of comfort will improve Outcome: Progressing   

## 2019-04-08 NOTE — Progress Notes (Addendum)
[facility] Progress Note   Subjective: Seen on HD. C/O pain and burning in LE. 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 Continue pain meds.  Emotional support to patient.   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 Exquisitely painful to touch. No RLE edema, no stump edema.  Dialysis Access: RIJ TDC Drsg-blood lines connected. CDI. L AVF + bruit   Additional Objective Labs: Basic Metabolic Panel: Recent Labs  Lab 04/03/19 0934  04/06/19 0404 04/07/19 0402 04/08/19 0932  NA 130*   < > 136 134* 134*  K 3.3*   < > 3.6 3.6 4.2  CL 93*   < > 96* 96* 96*  CO2 23   < > 20* 25 25  GLUCOSE 153*   < > 113* 121* 99  BUN 57*   < > 30* 20 20  CREATININE 8.38*   < > 5.19* 4.28* 4.02*  CALCIUM 8.2*   < > 8.6* 8.6* 8.6*  PHOS 6.4*  --   --   --  3.4   < > = values in this interval not displayed.   Liver Function Tests: Recent Labs  Lab 04/03/19 0934 04/08/19 0932  ALBUMIN 1.8* 1.7*   No results for input(s): LIPASE, AMYLASE in the last 168 hours. CBC: Recent Labs  Lab 04/02/19 0401 04/03/19 0447 04/04/19 0504 04/05/19 0459 04/06/19 0404 04/07/19 0402 04/08/19 0932  WBC 16.4* 18.1* 20.3* 19.4* 16.8* 13.6* 13.0*  NEUTROABS 11.2* 12.6* 12.9*  --   --   --   --   HGB 8.4* 8.2* 8.2* 7.9* 8.1* 8.1* 7.2*  HCT 28.4* 27.5* 26.0* 26.1* 25.5* 26.2* 24.4*  MCV 90.7 90.8 88.1 88.8 86.7 89.7 91.4  PLT 350 342 361 372 335 332 291   Blood Culture    Component Value Date/Time   SDES BLOOD LEFT ARM 03/28/2019 0530   SPECREQUEST  03/28/2019 0530    BOTTLES DRAWN  AEROBIC AND ANAEROBIC Blood Culture results may not be optimal due to an excessive volume of blood received in culture bottles   CULT  03/28/2019 0530    NO GROWTH 5 DAYS Performed at [facility], [place] [address] [place], [place] [address]    REPTSTATUS 04/02/2019 FINAL 03/28/2019 0530    Cardiac Enzymes: No results for input(s): CKTOTAL, CKMB, CKMBINDEX, TROPONINI in the last 168 hours. CBG: Recent Labs  Lab 04/03/19 0623 04/03/19 0839  GLUCAP 153* 138*   Iron Studies: No results for input(s): IRON, TIBC, TRANSFERRIN, FERRITIN in the last 72 hours. @lablastinr3 @ Studies/Results: No results found. Medications: . sodium chloride    . sodium chloride    . ceFEPime (MAXIPIME) IV 2 g (04/07/19 1710)  . sodium thiosulfate infusion for calciphylaxis 25 g (04/07/19 1434)   . atorvastatin  40 mg Oral Q2000  . bethanechol  25 mg Oral TID  . chlorhexidine  60 mL Topical Once  . Chlorhexidine Gluconate Cloth  6 each Topical Q0600  . Chlorhexidine Gluconate Cloth  6 each Topical Q0600  . Chlorhexidine Gluconate Cloth  6 each Topical Q0600  . citalopram  40 mg Oral Daily  . darbepoetin (ARANESP) injection - DIALYSIS  150 mcg Intravenous Q Wed-HD  . feeding supplement (NEPRO CARB STEADY)  237 mL Oral Q24H  . feeding supplement (PRO-STAT SUGAR FREE 64)  30 mL Oral BID  . fentaNYL  1 patch Transdermal Q72H  . ferric citrate  420 mg Oral TID WC  . gabapentin  300 mg Oral QHS  . heparin  4,000 Units Dialysis Once in dialysis  . heparin  5,000 Units Subcutaneous Q8H  . Melatonin  4.5 mg Oral QHS  . multivitamin  1 tablet Oral QHS  . pantoprazole  40 mg Oral Daily  . potassium chloride  20 mEq Oral BID  . 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 Daily HD  this week, low Ca+ bath, avoid VDRA and calcium based binders. Start on IV sodium thiosulfate 25 grams IV TIW. Pain control per primary.  2. ESRD -MWF HD. Has had serial HD D/T calciphylaxis. HD again today then resume MWF schedule next week and will consider adding 4th HD on Sat.  Use 4.0 K/2.0 Ca bath.  3. Anemia - HGB 7.2. Transfuse 1 unit PRBCs today. Rec'd Aranesp 150 mcg IV 04/05/19. Follow HGB.  4. Secondary hyperparathyroidism -Avoid calcium based binders, use 2.0 Ca bath. Ca 8.6 C Ca 10.3. Phos at goal. Continue Auryxia.  5. HTN/volume - BP and volume well controlled. Now on HD. Pre wt 113.1 kg  Well under OP EDW. Will need lower EDW on discharge. Minimal UF today 6. Nutrition - renal diet, fld restrictions, prostat. 7. UTI-Enterocococcus facialis. Per primary 8. DMT2-per primary  Jimmye Norman. Brown NP-C 04/08/2019, 10:56 AM  [facility] [phone]  Pt seen, examined and agree w assess/plan as above with additions as indicated.  [facility] 04/08/2019, 5:42 PM

## 2019-04-08 NOTE — Progress Notes (Signed)
Attempted to place on CPAP two times, patient stated he wants to continue to wait at this time, patient understands to contact respiratory when he is ready, no distress noted.

## 2019-04-08 NOTE — Progress Notes (Signed)
Patient refused CPAP for tonight. RT instructed patient to have RT called if he changes his mind. RT will monitor as needed. 

## 2019-04-09 ENCOUNTER — Encounter (HOSPITAL_COMMUNITY): Payer: Self-pay | Admitting: Anesthesiology

## 2019-04-09 LAB — GLUCOSE, CAPILLARY: Glucose-Capillary: 128 mg/dL — ABNORMAL HIGH (ref 70–99)

## 2019-04-09 LAB — RENAL FUNCTION PANEL
Albumin: 1.9 g/dL — ABNORMAL LOW (ref 3.5–5.0)
Anion gap: 13 (ref 5–15)
BUN: 25 mg/dL — ABNORMAL HIGH (ref 6–20)
CO2: 24 mmol/L (ref 22–32)
Calcium: 8.3 mg/dL — ABNORMAL LOW (ref 8.9–10.3)
Chloride: 94 mmol/L — ABNORMAL LOW (ref 98–111)
Creatinine, Ser: 4.1 mg/dL — ABNORMAL HIGH (ref 0.61–1.24)
GFR calc Af Amer: 19 mL/min — ABNORMAL LOW (ref >60)
GFR calc non Af Amer: 16 mL/min — ABNORMAL LOW (ref >60)
Glucose, Bld: 116 mg/dL — ABNORMAL HIGH (ref 70–99)
Phosphorus: 3.7 mg/dL (ref 2.5–4.6)
Potassium: 3.9 mmol/L (ref 3.5–5.1)
Sodium: 131 mmol/L — ABNORMAL LOW (ref 135–145)

## 2019-04-09 LAB — CBC
HCT: 28.3 % — ABNORMAL LOW (ref 39.0–52.0)
Hemoglobin: 8.2 g/dL — ABNORMAL LOW (ref 13.0–17.0)
MCH: 27.2 pg (ref 26.0–34.0)
MCHC: 29 g/dL — ABNORMAL LOW (ref 30.0–36.0)
MCV: 94 fL (ref 80.0–100.0)
Platelets: 288 10*3/uL (ref 150–400)
RBC: 3.01 MIL/uL — ABNORMAL LOW (ref 4.22–5.81)
RDW: 16 % — ABNORMAL HIGH (ref 11.5–15.5)
WBC: 14.5 10*3/uL — ABNORMAL HIGH (ref 4.0–10.5)
nRBC: 0 % (ref 0.0–0.2)

## 2019-04-09 LAB — MAGNESIUM: Magnesium: 1.8 mg/dL (ref 1.7–2.4)

## 2019-04-09 MED ORDER — CHLORHEXIDINE GLUCONATE CLOTH 2 % EX PADS
6.0000 | MEDICATED_PAD | Freq: Every day | CUTANEOUS | Status: DC
  Administered 2019-04-09 – 2019-04-11 (×3): 6 via TOPICAL

## 2019-04-09 MED ORDER — ACETAMINOPHEN 325 MG PO TABS
325.0000 mg | ORAL_TABLET | Freq: Once | ORAL | Status: AC
  Administered 2019-04-09: 325 mg via ORAL
  Filled 2019-04-09: qty 1

## 2019-04-09 MED ORDER — KETOROLAC TROMETHAMINE 30 MG/ML IJ SOLN
30.0000 mg | Freq: Once | INTRAMUSCULAR | Status: AC
  Administered 2019-04-09: 30 mg via INTRAVENOUS
  Filled 2019-04-09: qty 1

## 2019-04-09 NOTE — Anesthesia Preprocedure Evaluation (Deleted)
Anesthesia Evaluation    Airway        Dental   Pulmonary COPD,  COPD inhaler, former smoker (quit 01/2019),  04/02/2019 novel coronavirus NEG          Cardiovascular hypertension, + Peripheral Vascular Disease       Neuro/Psych Depression    GI/Hepatic GERD  Medicated,  Endo/Other  diabetesobese  Renal/GU Dialysis and ESRFRenal disease (MWF dialysis)     Musculoskeletal   Abdominal   Peds  Hematology   Anesthesia Other Findings   Reproductive/Obstetrics                             Anesthesia Physical Anesthesia Plan  ASA: III  Anesthesia Plan: MAC   Post-op Pain Management:    Induction:   PONV Risk Score and Plan: 1 and Treatment may vary due to age or medical condition  Airway Management Planned:   Additional Equipment:   Intra-op Plan:   Post-operative Plan:   Informed Consent:   Plan Discussed with:   Anesthesia Plan Comments:         Anesthesia Quick Evaluation

## 2019-04-09 NOTE — Progress Notes (Addendum)
[facility] Progress Note   Subjective: Appears more comfortable today but still C/Os pain. 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 Exquisitely painful to touch. No RLE edema, no stump edema. Dialysis Access:RIJ TDC Drsg CDI. L AVF + bruit   Additional Objective Labs: Basic Metabolic Panel: Recent Labs  Lab 04/03/19 0934  04/07/19 0402 04/08/19 0932 04/09/19 0802  NA 130*   < > 134* 134* 131*  K 3.3*   < > 3.6 4.2 3.9  CL 93*   < > 96* 96* 94*  CO2 23   < > 25 25 24   GLUCOSE 153*   < > 121* 99 116*  BUN 57*   < > 20 20 25*  CREATININE 8.38*   < > 4.28* 4.02* 4.10*  CALCIUM 8.2*   < > 8.6* 8.6* 8.3*  PHOS 6.4*  --   --  3.4 3.7   < > = values in this interval not displayed.   Liver Function Tests: Recent Labs  Lab 04/03/19 0934 04/08/19 0932 04/09/19 0802  ALBUMIN 1.8* 1.7* 1.9*   No results for input(s): LIPASE, AMYLASE in the last 168 hours. CBC: Recent Labs  Lab 04/03/19 0447 04/04/19 0504 04/05/19 0459 04/06/19 0404 04/07/19 0402 04/08/19 0932 04/09/19 0802  WBC 18.1* 20.3* 19.4* 16.8* 13.6* 13.0* 14.5*  NEUTROABS 12.6* 12.9*  --   --   --   --   --   HGB 8.2* 8.2* 7.9* 8.1* 8.1* 7.2* 8.2*  HCT 27.5* 26.0* 26.1* 25.5* 26.2* 24.4* 28.3*  MCV 90.8 88.1 88.8 86.7 89.7 91.4 94.0  PLT 342 361 372 335 332 291 288   Blood Culture    Component Value Date/Time   SDES BLOOD LEFT ARM 03/28/2019 0530   SPECREQUEST  03/28/2019 0530    BOTTLES DRAWN AEROBIC AND ANAEROBIC Blood Culture results may not be optimal due to an excessive volume of blood received in culture bottles   CULT  03/28/2019 0530    NO GROWTH 5 DAYS Performed at [facility], [place] [address], [place], [place] [address]    REPTSTATUS 04/02/2019 FINAL 03/28/2019 0530    Cardiac Enzymes: No results for input(s): CKTOTAL, CKMB, CKMBINDEX, TROPONINI in the last 168 hours. CBG: Recent Labs  Lab 04/03/19 0623 04/03/19 0839  GLUCAP 153* 138*   Iron Studies: No results for input(s): IRON, TIBC, TRANSFERRIN, FERRITIN in the last 72 hours. @lablastinr3 @ Studies/Results: No results found. Medications: . sodium thiosulfate infusion for calciphylaxis 25 g (04/07/19 1434)   . atorvastatin  40 mg Oral Q2000  . bethanechol  25 mg Oral TID  . chlorhexidine  60 mL Topical Once  . Chlorhexidine Gluconate Cloth  6 each Topical Q0600  . Chlorhexidine Gluconate Cloth  6 each Topical Q0600  . Chlorhexidine Gluconate Cloth  6 each Topical Q0600  . citalopram  40 mg Oral Daily  . darbepoetin (ARANESP) injection -  DIALYSIS  150 mcg Intravenous Q Wed-HD  . feeding supplement (NEPRO CARB STEADY)  237 mL Oral Q24H  . feeding supplement (PRO-STAT SUGAR FREE 64)  30 mL Oral BID  . fentaNYL  1 patch Transdermal Q72H  . ferric citrate  420 mg Oral TID WC  . gabapentin  300 mg Oral QHS  . heparin  5,000 Units Subcutaneous Q8H  . Melatonin  4.5 mg Oral QHS  . multivitamin  1 tablet Oral QHS  . pantoprazole  40 mg Oral Daily  . potassium chloride  20 mEq Oral BID  . 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 Daily HD this week, low Ca+ bath, avoid VDRA and calcium based  binders. Start on IV sodium thiosulfate 25 grams IV TIW. Pain control per primary.Will need wound care/pain management consults on DC.  2. ESRD -MWF HD. Has had serial HD D/T calciphylaxis. HD again today then resume MWF schedule next week and add 4th HD on Sat.  Use 4.0 K/2.0 Ca bath.Will order HD here tomorrow in case he does not go home today. Do 1st shift.  3. Anemia -HGB 8.2. Transfused 1 unit PRBCs 09/19 HGB 7.2. Rec'd Aranesp 150 mcg IV 04/05/19. Follow HGB. 4. Secondary hyperparathyroidism -Avoid calcium based binders, use 2.0 Ca bath. Ca 8.6 C Ca 10.3.Phos at goal. Continue Auryxia.  5. HTN/volume -BP and volume well controlled. Serial HD last week, weight down to 111.2 kg. Lower EDW on DC.  6. Nutrition -renal diet, fld restrictions, prostat. 7. UTI-Enterocococcus facialis. No ABX Per primary 8. DMT2-per primary   Disposition: Discussed with primary. Plans to DC today to SNF. Will need HD MWFS at OP clinic. Primary to arrange wound care and pain management.   Rita H. Brown NP-C 04/09/2019, 10:08 AM  [facility] [phone]  Pt seen, examined and agree w A/P as above.  Kelly Splinter  MD 04/09/2019, 7:00 PM

## 2019-04-09 NOTE — Progress Notes (Signed)
PT Cancellation Note  Patient Details Name: Michael Riggs MRN: [id number] DOB: 1970/11/25   Cancelled Treatment:    Reason Eval/Treat Not Completed: Other (comment)   Declining PT eval at this time;   Will reattempt,   Roney Marion, [place] Pager [phone] Office [place] 04/09/2019, 4:00 PM

## 2019-04-09 NOTE — Progress Notes (Addendum)
Notified by bedside RN RN that patient's temp was 103.1.  Patient tachycardic into the 120s as well but believe this is fever driven.  On entering room patient experiencing some chills and rigor as well.  He is alert to self able to tell me he is in the hospital but unable to state [facility].  He does follow commands.  Will order 1 g of Tylenol x1.  If fever persist and tachycardia persist RN informed to repage provider  Arby Barrette, APRN-C [facility] Pager [phone]   Addendum: Pt still febrile at 102.9. Ordered a CBC and Lactic Acid as well as Toradol 30mg  IV X1

## 2019-04-09 NOTE — Progress Notes (Signed)
Pt has refused cpap for seberal days.  RT removed and Dc'd at this time.  Circuit left in room in case pt decides to have device brought back.  RT will monitor.

## 2019-04-09 NOTE — Progress Notes (Signed)
PROGRESS NOTE    Michael Riggs  [name] DOB: [birth year] DOA: 03/28/2019 PCP: [name], MD   Brief Narrative:  48 year old with history of ESRD on TTS HD, hypertension hyperlipidemia sent from SNF for evaluation of fever, confusion right lower extremity pain concerning for infection.  Within last 2 months he had been admitting in the hospital for worsening of renal function and fluid overload.  Started on dialysis and eventually sent to rehab.  This time admitted for sepsis secondary to right lower extremity cellulitis with concerns for necrotic tissue.  Orthopedic and plastic surgery consulted who recommended medical management and have Dr. Sharol Given follow-up once he returns from his vacation.  Surgical plans per orthopedic and nephrology   Assessment & Plan:   Principal Problem:   Wound infection Active Problems:   Hypertension   ESRD (end stage renal disease) on dialysis (HCC)   Dyslipidemia   Diabetes mellitus with peripheral vascular disease (HCC)   Depression   Physical deconditioning   Sepsis secondary to right lower extremity cellulitis with some pruritus improved Chronic Eschar Acute calciphylaxis with pain -Hold Abx.  - Orthopedic following.  Seen by plastic surgery -Surgical plan to be discussed with an orthopedic and nephrology -Pain control, bowel regimen. -Continue fentanyl patch, IV morphine and oxycodone IR. Will need outpatient Pain management especially if he will continue to need high doses of Pain medications.   ESRD on hemodialysis -Ongoing dialysis per nephrology team  Essential hypertension - Not on medications besides dialysis and Lasix  Acute on chronic anemia -PRBC transfusion ordered.  No obvious signs of blood loss.  Hyperlipidemia - On Lipitor 40 mg daily  History of recent syphilis treated with penicillin  Diabetes mellitus type 2 Peripheral neuropathy -Recent hemoglobin A1c 5.0. -Insulin sliding scale.  Continue  gabapentin  Will transfer him back to SNF  DVT prophylaxis: Subcutaneous heparin Code Status: Full code Family Communication: None at bedside Disposition Plan: Placement back to his SNF. Repeat COVID ordered.   Consultants:   Plastic surgery  Orthopedic  Procedures:   None  Antimicrobials:   Cefepime stopped 9/19   Subjective: Still complaining of right leg pain but better.   Review of Systems Otherwise negative except as per HPI, including: General = no fevers, chills, dizziness, malaise, fatigue HEENT/EYES = negative for pain, redness, loss of vision, double vision, blurred vision, loss of hearing, sore throat, hoarseness, dysphagia Cardiovascular= negative for chest pain, palpitation, murmurs, lower extremity swelling Respiratory/lungs= negative for shortness of breath, cough, hemoptysis, wheezing, mucus production Gastrointestinal= negative for nausea, vomiting,, abdominal pain, melena, hematemesis Genitourinary= negative for Dysuria, Hematuria, Change in Urinary Frequency MSK = right leg pain.  Neurology= Negative for headache, seizures, numbness, tingling  Psychiatry= Negative for anxiety, depression, suicidal and homocidal ideation Allergy/Immunology= Medication/Food allergy as listed  Skin= Negative for Rash, lesions, ulcers, itching   Objective: Vitals:   04/08/19 1729 04/08/19 1951 04/09/19 0345 04/09/19 0816  BP: 126/82 124/78 126/79 132/74  Pulse: 89 85 86 84  Resp: 18 18 18 20   Temp: 99.4 F (37.4 C) 99.1 F (37.3 C) 100 F (37.8 C) 99.4 F (37.4 C)  TempSrc: Oral Oral Oral Oral  SpO2: 95% 92% 94% 98%  Weight:   113 kg   Height:        Intake/Output Summary (Last 24 hours) at 04/09/2019 1206 Last data filed at 04/09/2019 0600 Gross per 24 hour  Intake 798 ml  Output 0 ml  Net 798 ml   [employer]  04/08/19 0653 04/08/19 1106 04/09/19 0345  Weight: 113.1 kg 111.9 kg 113 kg    Examination: Constitutional: NAD, calm,  comfortable Eyes: PERRL, lids and conjunctivae normal ENMT: Mucous membranes are moist. Posterior pharynx clear of any exudate or lesions.Normal dentition.  Neck: normal, supple, no masses, no thyromegaly Respiratory: bibasilar crackles.  Cardiovascular: Regular rate and rhythm, no murmurs / rubs / gallops. No extremity edema. 2+ pedal pulses. No carotid bruits.  Abdomen: no tenderness, no masses palpated. No hepatosplenomegaly. Bowel sounds positive.  Musculoskeletal: no clubbing / cyanosis. No joint deformity upper and lower extremities. Good ROM, no contractures. Normal muscle tone.  Skin: L BKA, RLE has necrotic dark appearing scars- improved.  Neurologic: CN 2-12 grossly intact. Sensation intact, DTR normal. Strength 4/5 in all 4.  Psychiatric: Normal judgment and insight. Alert and oriented x 3. Normal mood.    Hemodialysis catheter noted in his right neck Left upper extremity AVF  Data Reviewed:   CBC: Recent Labs  Lab 04/03/19 0447 04/04/19 0504 04/05/19 0459 04/06/19 0404 04/07/19 0402 04/08/19 0932 04/09/19 0802  WBC 18.1* 20.3* 19.4* 16.8* 13.6* 13.0* 14.5*  NEUTROABS 12.6* 12.9*  --   --   --   --   --   HGB 8.2* 8.2* 7.9* 8.1* 8.1* 7.2* 8.2*  HCT 27.5* 26.0* 26.1* 25.5* 26.2* 24.4* 28.3*  MCV 90.8 88.1 88.8 86.7 89.7 91.4 94.0  PLT 342 361 372 335 332 291 [id number]   Basic Metabolic Panel: Recent Labs  Lab 04/03/19 0934  04/05/19 0459 04/06/19 0404 04/07/19 0402 04/08/19 0932 04/09/19 0802  NA 130*   < > 132* 136 134* 134* 131*  K 3.3*   < > 3.8 3.6 3.6 4.2 3.9  CL 93*   < > 96* 96* 96* 96* 94*  CO2 23   < > 18* 20* 25 25 24   GLUCOSE 153*   < > 138* 113* 121* 99 116*  BUN 57*   < > 50* 30* 20 20 25*  CREATININE 8.38*   < > 7.34* 5.19* 4.28* 4.02* 4.10*  CALCIUM 8.2*   < > 8.7* 8.6* 8.6* 8.6* 8.3*  MG  --    < > 2.0 1.9 1.9 2.0 1.8  PHOS 6.4*  --   --   --   --  3.4 3.7   < > = values in this interval not displayed.   GFR: Estimated Creatinine Clearance:  28 mL/min (A) (by C-G formula based on SCr of 4.1 mg/dL (H)). Liver Function Tests: Recent Labs  Lab 04/03/19 0934 04/08/19 0932 04/09/19 0802  ALBUMIN 1.8* 1.7* 1.9*   No results for input(s): LIPASE, AMYLASE in the last 168 hours. No results for input(s): AMMONIA in the last 168 hours. Coagulation Profile: No results for input(s): INR, PROTIME in the last 168 hours. Cardiac Enzymes: No results for input(s): CKTOTAL, CKMB, CKMBINDEX, TROPONINI in the last 168 hours. BNP (last 3 results) No results for input(s): PROBNP in the last 8760 hours. HbA1C: No results for input(s): HGBA1C in the last 72 hours. CBG: Recent Labs  Lab 04/03/19 0623 04/03/19 0839  GLUCAP 153* 138*   Lipid Profile: No results for input(s): CHOL, HDL, LDLCALC, TRIG, CHOLHDL, LDLDIRECT in the last 72 hours. Thyroid Function Tests: No results for input(s): TSH, T4TOTAL, FREET4, T3FREE, THYROIDAB in the last 72 hours. Anemia Panel: No results for input(s): VITAMINB12, FOLATE, FERRITIN, TIBC, IRON, RETICCTPCT in the last 72 hours. Sepsis Labs: No results for input(s): PROCALCITON, LATICACIDVEN in the last 168  hours.  Recent Results (from the past 240 hour(s))  C difficile quick scan w PCR reflex     Status: None   Collection Time: 04/02/19  6:35 AM   Specimen: STOOL  Result Value Ref Range Status   C Diff antigen NEGATIVE NEGATIVE Final   C Diff toxin NEGATIVE NEGATIVE Final   C Diff interpretation No C. difficile detected.  Final    Comment: Performed at [facility], [place] [address], [place], [place] [address]  Novel Coronavirus, NAA (hospital order; send-out to ref lab)     Status: None   Collection Time: 04/02/19  3:38 PM   Specimen: Nasopharyngeal Swab; Respiratory  Result Value Ref Range Status   SARS-CoV-2, NAA NOT DETECTED NOT DETECTED Final    Comment: (NOTE) This nucleic acid amplification test was developed and its performance characteristics determined by [employer]. Nucleic acid amplification tests include PCR and TMA. This test has not been FDA cleared or approved. This test has been authorized by FDA under an Emergency Use Authorization (EUA). This test is only authorized for the duration of time the declaration that circumstances exist justifying the authorization of the emergency use of in vitro diagnostic tests for detection of SARS-CoV-2 virus and/or diagnosis of COVID-19 infection under section 564(b)(1) of the Act, 21 U.S.C. [id number]) (1), unless the authorization is terminated or revoked sooner. When diagnostic testing is negative, the possibility of a false negative result should be considered in the context of a patient's recent exposures and the presence of clinical signs and symptoms consistent with COVID-19. An individual without symptoms of COVID- 19 and who is not shedding SARS-CoV-2 vi rus would expect to have a negative (not detected) result in this assay. Performed At: [facility] [address] [place], [place] [id number] Rush Farmer MD [id number]    [place]  Final    Comment: Performed at [facility], [place] [address], [place], [place] [address]         Radiology Studies: No results found.      Scheduled Meds: . atorvastatin  40 mg Oral Q2000  . bethanechol  25 mg Oral TID  . chlorhexidine  60 mL Topical Once  . Chlorhexidine Gluconate Cloth  6 each Topical Q0600  . Chlorhexidine Gluconate Cloth  6 each Topical Q0600  . citalopram  40 mg Oral Daily  . darbepoetin (ARANESP) injection - DIALYSIS  150 mcg Intravenous Q Wed-HD  . feeding supplement (NEPRO CARB STEADY)  237 mL Oral Q24H  . feeding supplement (PRO-STAT SUGAR FREE 64)  30 mL Oral BID  . fentaNYL  1 patch Transdermal Q72H  . ferric citrate  420 mg Oral TID WC  . gabapentin  300 mg Oral QHS  . heparin  5,000 Units Subcutaneous Q8H  . Melatonin  4.5 mg Oral QHS  . multivitamin  1 tablet  Oral QHS  . pantoprazole  40 mg Oral Daily  . potassium chloride  20 mEq Oral BID  . tamsulosin  0.4 mg Oral QPC breakfast   Continuous Infusions: . sodium thiosulfate infusion for calciphylaxis 25 g (04/07/19 1434)     LOS: 12 days   Time spent= 25 mins    Niurka Benecke Arsenio Loader, MD [facility]  If 7PM-7AM, please contact night-coverage [email or web address] 04/09/2019, 12:06 PM

## 2019-04-09 NOTE — Plan of Care (Signed)
  Problem: Health Behavior/Discharge Planning: Goal: Ability to manage health-related needs will improve Outcome: Progressing   Problem: Activity: Goal: Risk for activity intolerance will decrease Outcome: Progressing   Problem: Pain Managment: Goal: General experience of comfort will improve Outcome: Progressing   Problem: Skin Integrity: Goal: Risk for impaired skin integrity will decrease Outcome: Progressing   

## 2019-04-10 ENCOUNTER — Inpatient Hospital Stay (HOSPITAL_COMMUNITY): Payer: Medicare Other | Admitting: Registered Nurse

## 2019-04-10 ENCOUNTER — Inpatient Hospital Stay (HOSPITAL_COMMUNITY): Payer: Medicare Other

## 2019-04-10 ENCOUNTER — Encounter (HOSPITAL_COMMUNITY): Payer: Self-pay | Admitting: General Practice

## 2019-04-10 ENCOUNTER — Ambulatory Visit (HOSPITAL_COMMUNITY): Admission: RE | Admit: 2019-04-10 | Payer: Medicare Other | Source: Home / Self Care | Admitting: Vascular Surgery

## 2019-04-10 ENCOUNTER — Encounter (HOSPITAL_COMMUNITY)
Admission: EM | Disposition: A | Discharge status: Skilled Nursing Facility | Payer: Self-pay | Source: Skilled Nursing Facility | Attending: Internal Medicine

## 2019-04-10 DIAGNOSIS — R6521 Severe sepsis with septic shock: Secondary | ICD-10-CM

## 2019-04-10 DIAGNOSIS — A419 Sepsis, unspecified organism: Secondary | ICD-10-CM

## 2019-04-10 DIAGNOSIS — J9601 Acute respiratory failure with hypoxia: Secondary | ICD-10-CM

## 2019-04-10 LAB — RENAL FUNCTION PANEL
Albumin: 1.8 g/dL — ABNORMAL LOW (ref 3.5–5.0)
Albumin: 1.7 g/dL — ABNORMAL LOW (ref 3.5–5.0)
Anion gap: 12 (ref 5–15)
Anion gap: 12 (ref 5–15)
BUN: 38 mg/dL — ABNORMAL HIGH (ref 6–20)
BUN: 39 mg/dL — ABNORMAL HIGH (ref 6–20)
CO2: 23 mmol/L (ref 22–32)
CO2: 20 mmol/L — ABNORMAL LOW (ref 22–32)
Calcium: 8.2 mg/dL — ABNORMAL LOW (ref 8.9–10.3)
Calcium: 8.3 mg/dL — ABNORMAL LOW (ref 8.9–10.3)
Chloride: 95 mmol/L — ABNORMAL LOW (ref 98–111)
Chloride: 103 mmol/L (ref 98–111)
Creatinine, Ser: 5.54 mg/dL — ABNORMAL HIGH (ref 0.61–1.24)
Creatinine, Ser: 5.7 mg/dL — ABNORMAL HIGH (ref 0.61–1.24)
GFR calc Af Amer: 13 mL/min — ABNORMAL LOW (ref >60)
GFR calc Af Amer: 13 mL/min — ABNORMAL LOW (ref >60)
GFR calc non Af Amer: 11 mL/min — ABNORMAL LOW (ref >60)
GFR calc non Af Amer: 11 mL/min — ABNORMAL LOW (ref >60)
Glucose, Bld: 130 mg/dL — ABNORMAL HIGH (ref 70–99)
Glucose, Bld: 152 mg/dL — ABNORMAL HIGH (ref 70–99)
Phosphorus: 3.4 mg/dL (ref 2.5–4.6)
Phosphorus: 4.4 mg/dL (ref 2.5–4.6)
Potassium: 4.3 mmol/L (ref 3.5–5.1)
Potassium: 4.4 mmol/L (ref 3.5–5.1)
Sodium: 130 mmol/L — ABNORMAL LOW (ref 135–145)
Sodium: 135 mmol/L (ref 135–145)

## 2019-04-10 LAB — CBC
HCT: 27.7 % — ABNORMAL LOW (ref 39.0–52.0)
HCT: 28.1 % — ABNORMAL LOW (ref 39.0–52.0)
HCT: 29 % — ABNORMAL LOW (ref 39.0–52.0)
Hemoglobin: 8.5 g/dL — ABNORMAL LOW (ref 13.0–17.0)
Hemoglobin: 8.6 g/dL — ABNORMAL LOW (ref 13.0–17.0)
Hemoglobin: 8.8 g/dL — ABNORMAL LOW (ref 13.0–17.0)
MCH: 28 pg (ref 26.0–34.0)
MCH: 28 pg (ref 26.0–34.0)
MCH: 28.3 pg (ref 26.0–34.0)
MCHC: 30.2 g/dL (ref 30.0–36.0)
MCHC: 30.3 g/dL (ref 30.0–36.0)
MCHC: 31 g/dL (ref 30.0–36.0)
MCV: 91.1 fL (ref 80.0–100.0)
MCV: 92.4 fL (ref 80.0–100.0)
MCV: 92.4 fL (ref 80.0–100.0)
Platelets: 292 10*3/uL (ref 150–400)
Platelets: 300 10*3/uL (ref 150–400)
Platelets: 333 10*3/uL (ref 150–400)
RBC: 3.04 MIL/uL — ABNORMAL LOW (ref 4.22–5.81)
RBC: 3.04 MIL/uL — ABNORMAL LOW (ref 4.22–5.81)
RBC: 3.14 MIL/uL — ABNORMAL LOW (ref 4.22–5.81)
RDW: 16.4 % — ABNORMAL HIGH (ref 11.5–15.5)
RDW: 16.5 % — ABNORMAL HIGH (ref 11.5–15.5)
RDW: 16.6 % — ABNORMAL HIGH (ref 11.5–15.5)
WBC: 29.9 10*3/uL — ABNORMAL HIGH (ref 4.0–10.5)
WBC: 33 10*3/uL — ABNORMAL HIGH (ref 4.0–10.5)
WBC: 35.1 10*3/uL — ABNORMAL HIGH (ref 4.0–10.5)
nRBC: 0 % (ref 0.0–0.2)
nRBC: 0 % (ref 0.0–0.2)
nRBC: 0 % (ref 0.0–0.2)

## 2019-04-10 LAB — GLUCOSE, CAPILLARY
Glucose-Capillary: 127 mg/dL — ABNORMAL HIGH (ref 70–99)
Glucose-Capillary: 133 mg/dL — ABNORMAL HIGH (ref 70–99)
Glucose-Capillary: 127 mg/dL — ABNORMAL HIGH (ref 70–99)
Glucose-Capillary: 106 mg/dL — ABNORMAL HIGH (ref 70–99)
Glucose-Capillary: 147 mg/dL — ABNORMAL HIGH (ref 70–99)

## 2019-04-10 LAB — POCT I-STAT 7, (LYTES, BLD GAS, ICA,H+H)
Bicarbonate: 24 mmol/L (ref 20.0–28.0)
Calcium, Ion: 1.16 mmol/L (ref 1.15–1.40)
HCT: 29 % — ABNORMAL LOW (ref 39.0–52.0)
Hemoglobin: 9.9 g/dL — ABNORMAL LOW (ref 13.0–17.0)
O2 Saturation: 100 %
Patient temperature: 98
Potassium: 4.5 mmol/L (ref 3.5–5.1)
Sodium: 137 mmol/L (ref 135–145)
TCO2: 25 mmol/L (ref 22–32)
pCO2 arterial: 37.4 mmHg (ref 32.0–48.0)
pH, Arterial: 7.415 (ref 7.350–7.450)
pO2, Arterial: 257 mmHg — ABNORMAL HIGH (ref 83.0–108.0)

## 2019-04-10 LAB — BLOOD GAS, ARTERIAL
Acid-base deficit: 2.8 mmol/L — ABNORMAL HIGH (ref 0.0–2.0)
Bicarbonate: 22.4 mmol/L (ref 20.0–28.0)
Drawn by: 362771
FIO2: 100
O2 Saturation: 97.5 %
Patient temperature: 102
pCO2 arterial: 48.6 mmHg — ABNORMAL HIGH (ref 32.0–48.0)
pH, Arterial: 7.296 — ABNORMAL LOW (ref 7.350–7.450)
pO2, Arterial: 132 mmHg — ABNORMAL HIGH (ref 83.0–108.0)

## 2019-04-10 LAB — PROTIME-INR
INR: 1.6 — ABNORMAL HIGH (ref 0.8–1.2)
Prothrombin Time: 18.5 seconds — ABNORMAL HIGH (ref 11.4–15.2)

## 2019-04-10 LAB — HEPARIN LEVEL (UNFRACTIONATED)
Heparin Unfractionated: 0.12 IU/mL — ABNORMAL LOW (ref 0.30–0.70)
Heparin Unfractionated: <0.1 IU/mL — ABNORMAL LOW (ref 0.30–0.70)

## 2019-04-10 LAB — MAGNESIUM: Magnesium: 1.7 mg/dL (ref 1.7–2.4)

## 2019-04-10 LAB — SARS CORONAVIRUS 2 (TAT 6-24 HRS): SARS Coronavirus 2: NEGATIVE

## 2019-04-10 LAB — LACTIC ACID, PLASMA
Lactic Acid, Venous: 1.6 mmol/L (ref 0.5–1.9)
Lactic Acid, Venous: 1.7 mmol/L (ref 0.5–1.9)

## 2019-04-10 LAB — MRSA PCR SCREENING: MRSA by PCR: NEGATIVE

## 2019-04-10 IMAGING — CT CT HEAD W/O CM
4 series · 16 of 47 positions shown, 18 images · non-contrast
Comparison: None.

CLINICAL DATA: Altered mental status with unclear cause

EXAM:
CT HEAD WITHOUT CONTRAST
TECHNIQUE: Contiguous axial images were obtained from the base of the skull
through the vertex without intravenous contrast.

[Series 3: head without · axial · non-contrast · 0.47mm/px · z∈[-109,+26]mm · 7 of 37 slices shown, 9 images]
[im 5/37  brain]
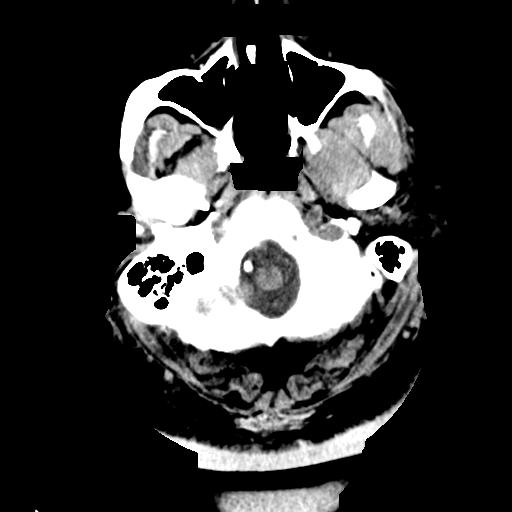
[im 5/37  bone]
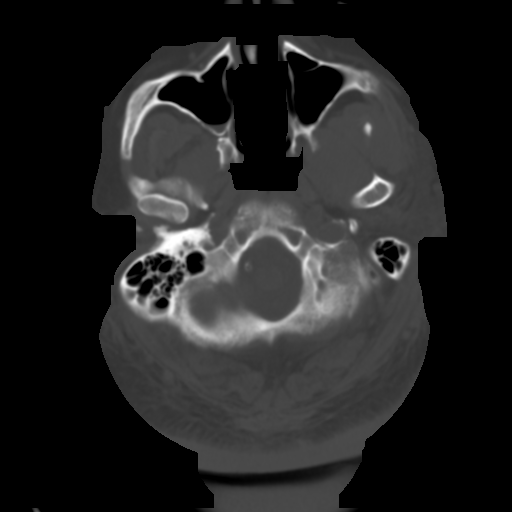
[im 10/37  brain]
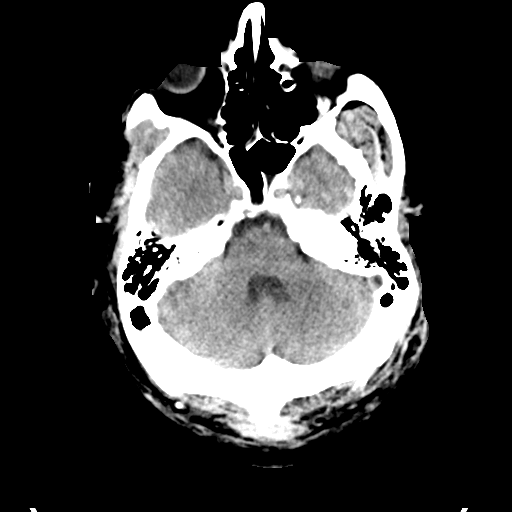
[im 14/37  brain]
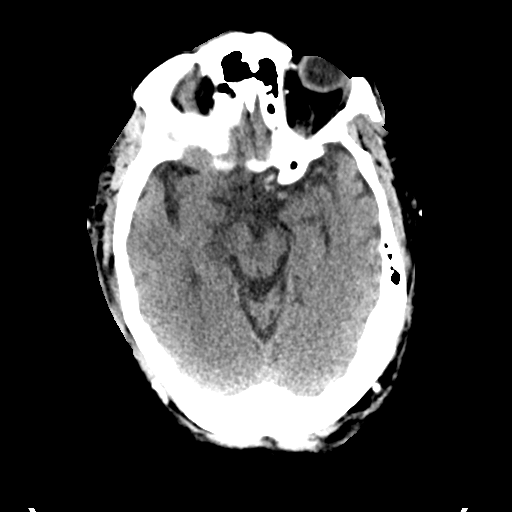
[im 19/37  brain]
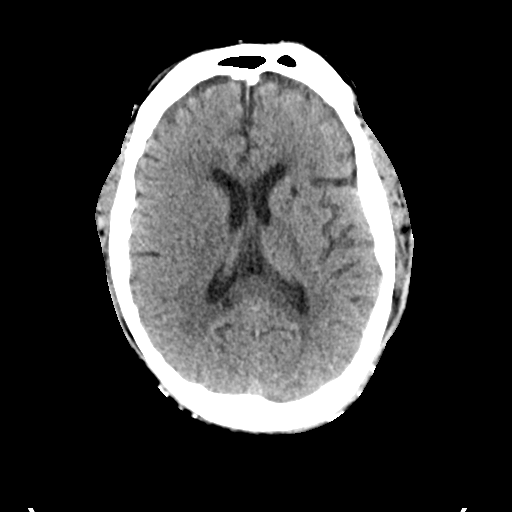
[im 23/37  brain]
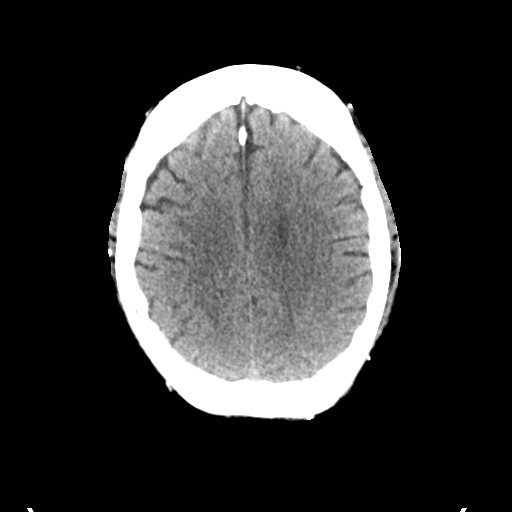
[im 23/37  bone]
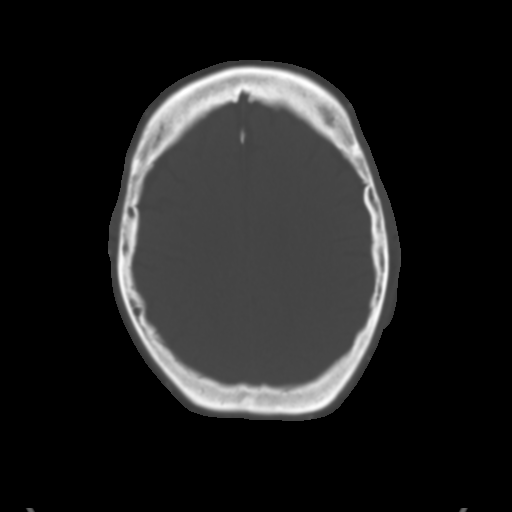
[im 28/37  brain]
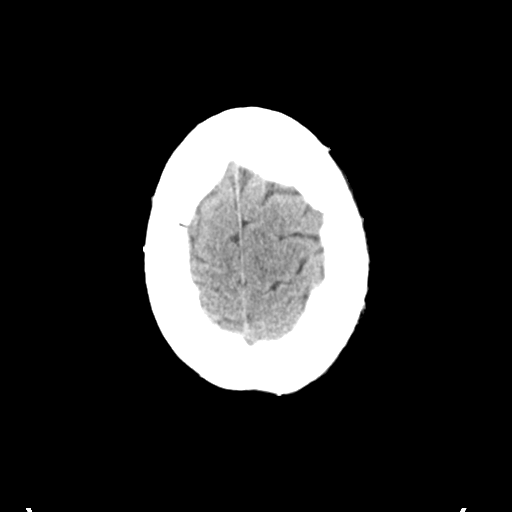
[im 32/37  brain]
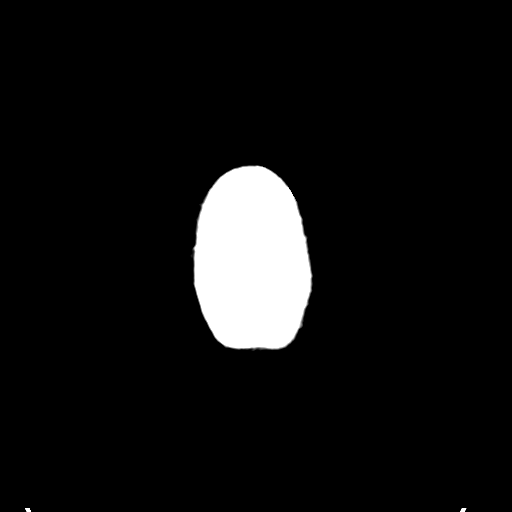

[Series 4: head bone · axial · 0.47mm/px · z∈[-111,-75]mm · 3 of 91 slices shown]
[im 10/91  bone]
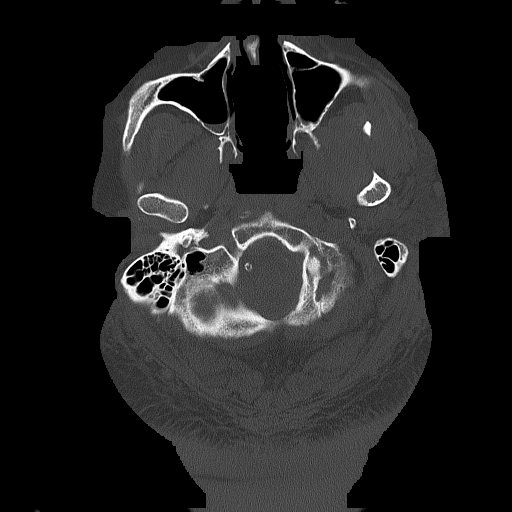
[im 19/91  bone]
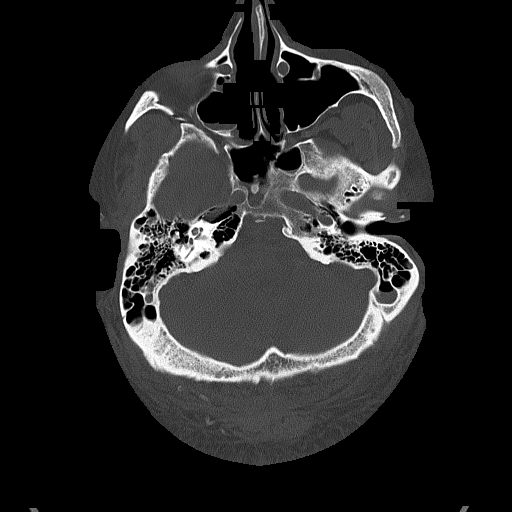
[im 28/91  bone]
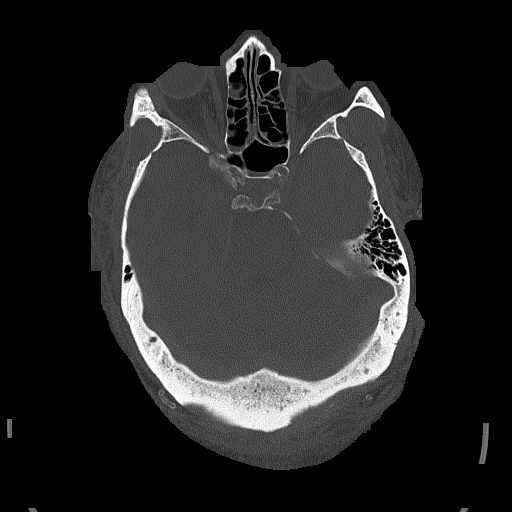

[Series 5: head without cor · coronal · non-contrast · 0.36mm/px · 3 of 78 slices shown]
[im 26/78  brain]
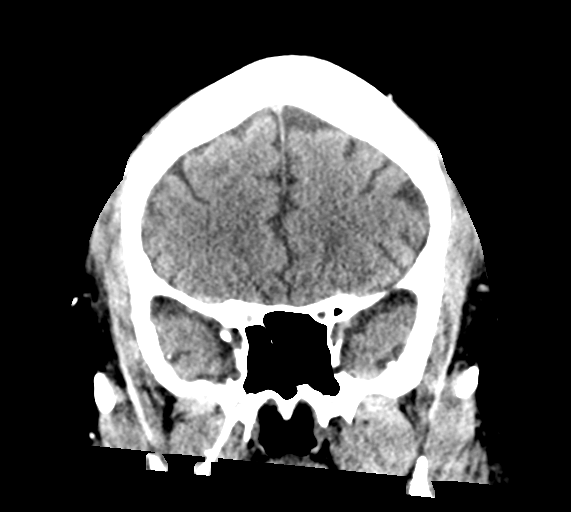
[im 35/78  brain]
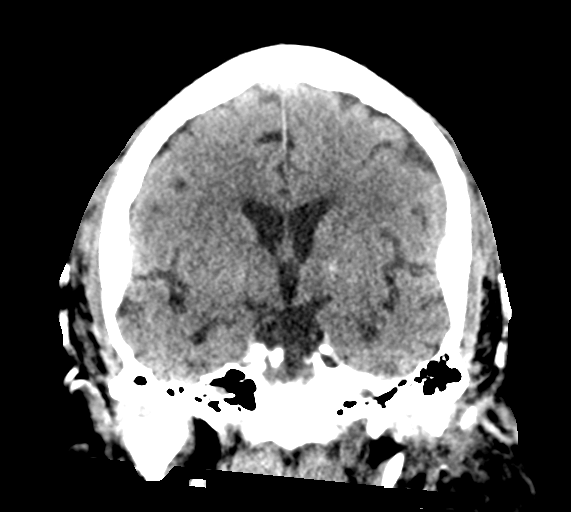
[im 43/78  brain]
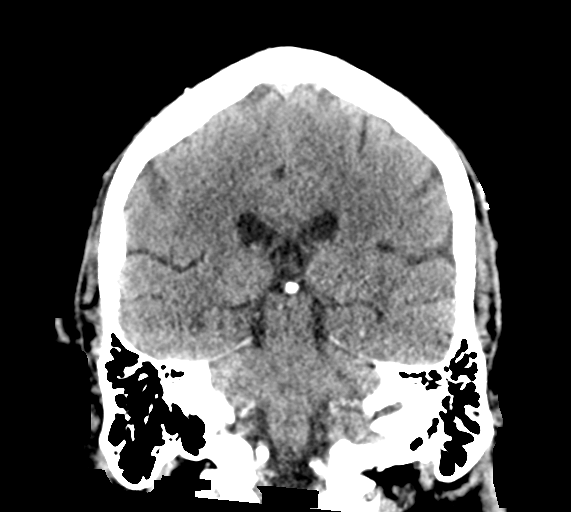

[Series 6: head without sag · sagittal · non-contrast · 0.37mm/px · 3 of 71 slices shown]
[im 25/71  brain]
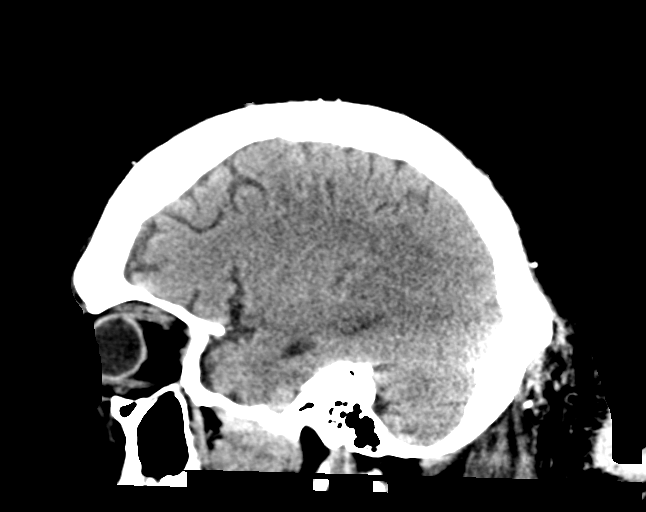
[im 35/71  brain]
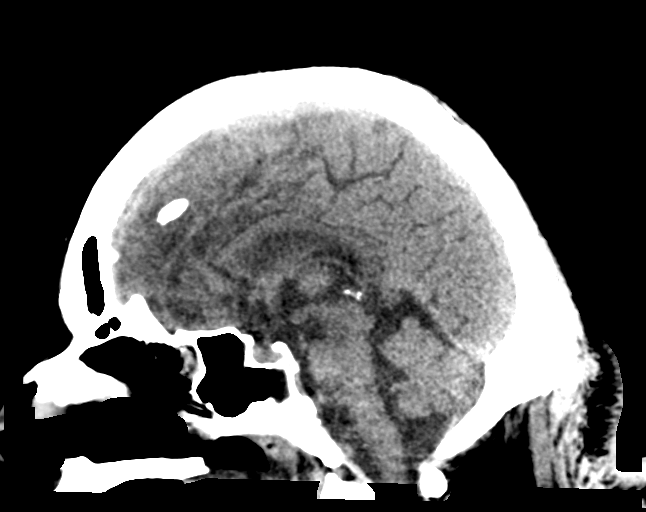
[im 46/71  brain]
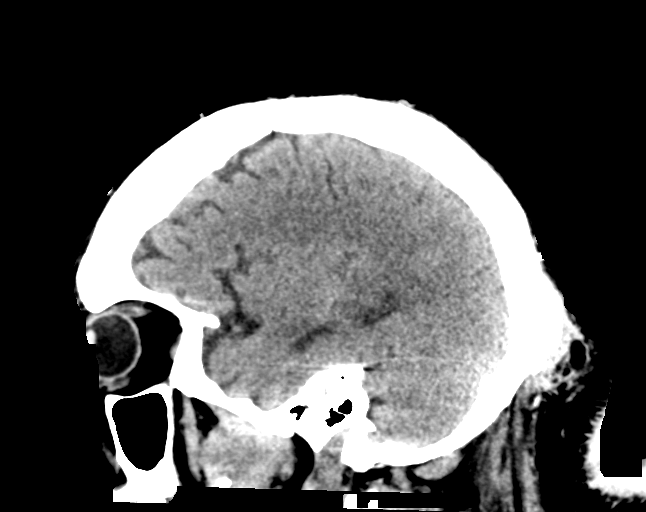

[16 of 47 positions shown; findings below may reference images not displayed]

FINDINGS: Brain: No evidence of acute infarction, hemorrhage, hydrocephalus,
extra-axial collection or mass lesion/mass effect. Remote lacunar
infarct the left anterior corona radiata.

Vascular: No hyperdense vessel. End-stage renal disease with
extensive vessel calcification in the scalp.

Skull: Normal. Negative for fracture or focal lesion.

Sinuses/Orbits: No acute finding.
IMPRESSION: No acute finding.

## 2019-04-10 IMAGING — DX DG CHEST 1V PORT
2 series · 2 of 2 positions shown · non-contrast
Comparison: Radiograph [DATE]

CLINICAL DATA: Intubation.

EXAM:
PORTABLE CHEST 1 VIEW

[chest ap (1 of 2)]
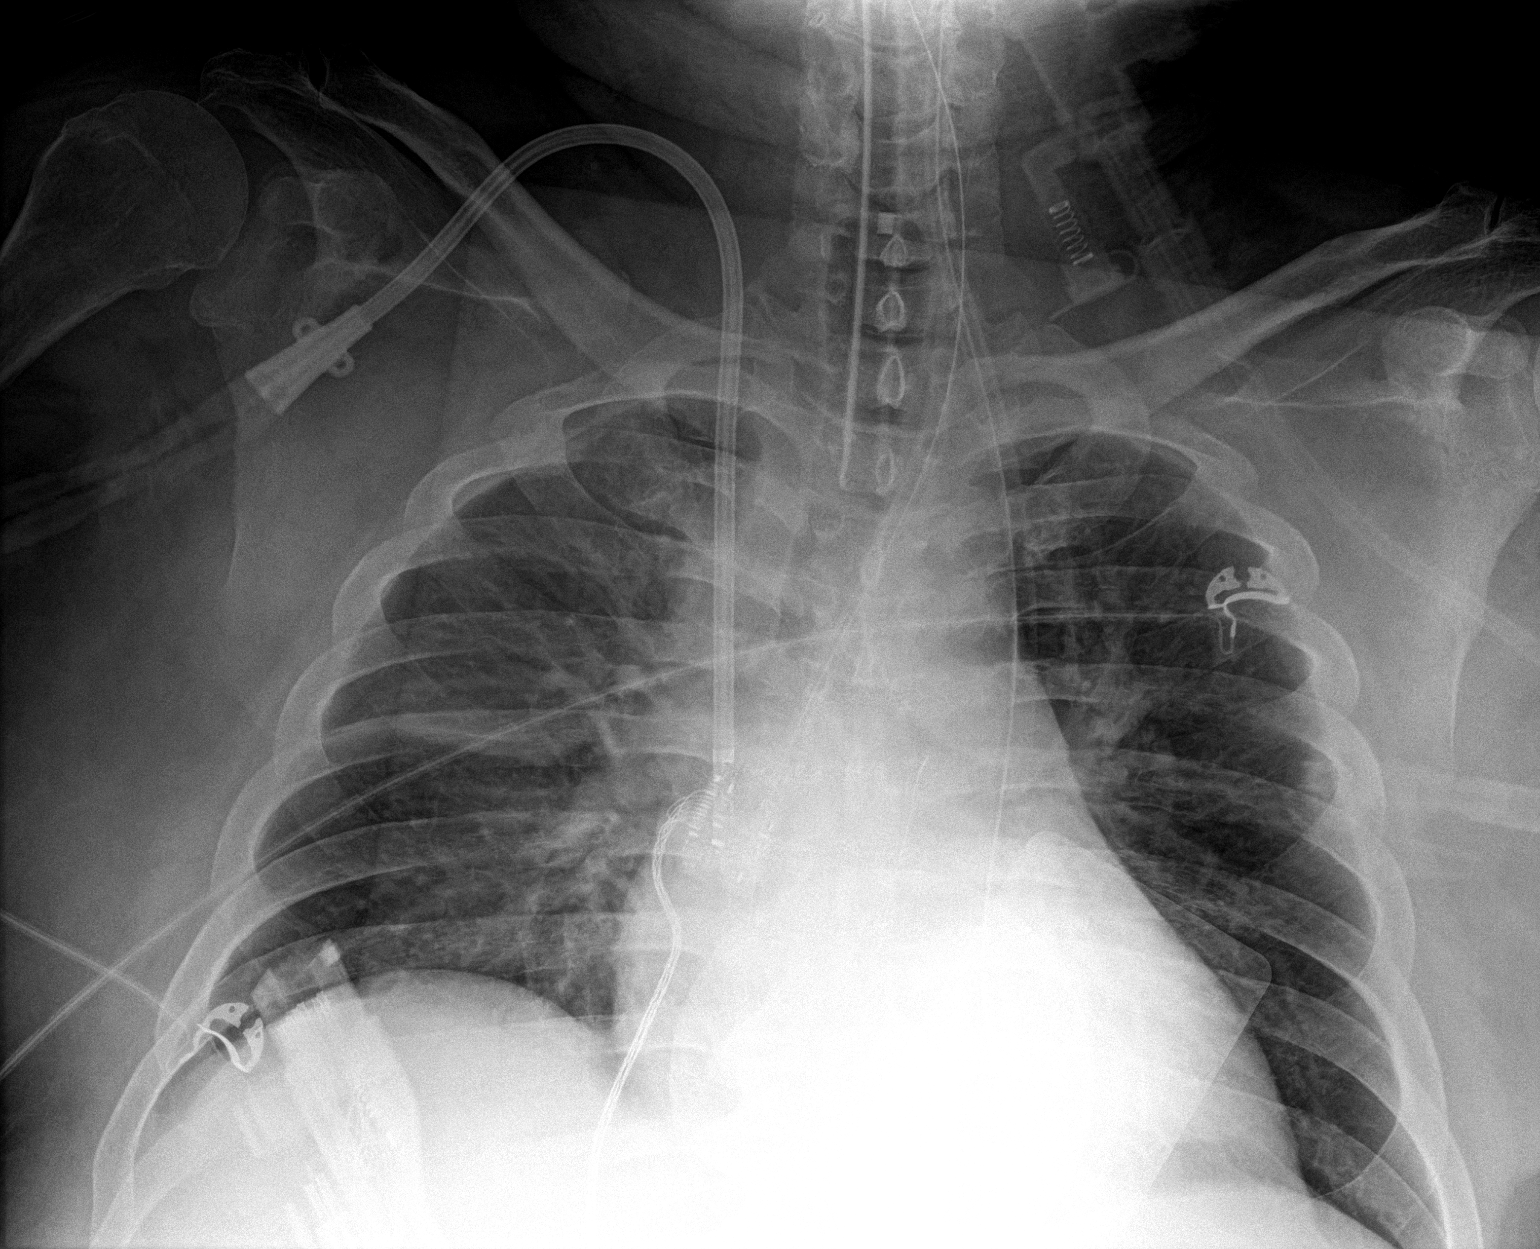

[chest ap (2 of 2)]
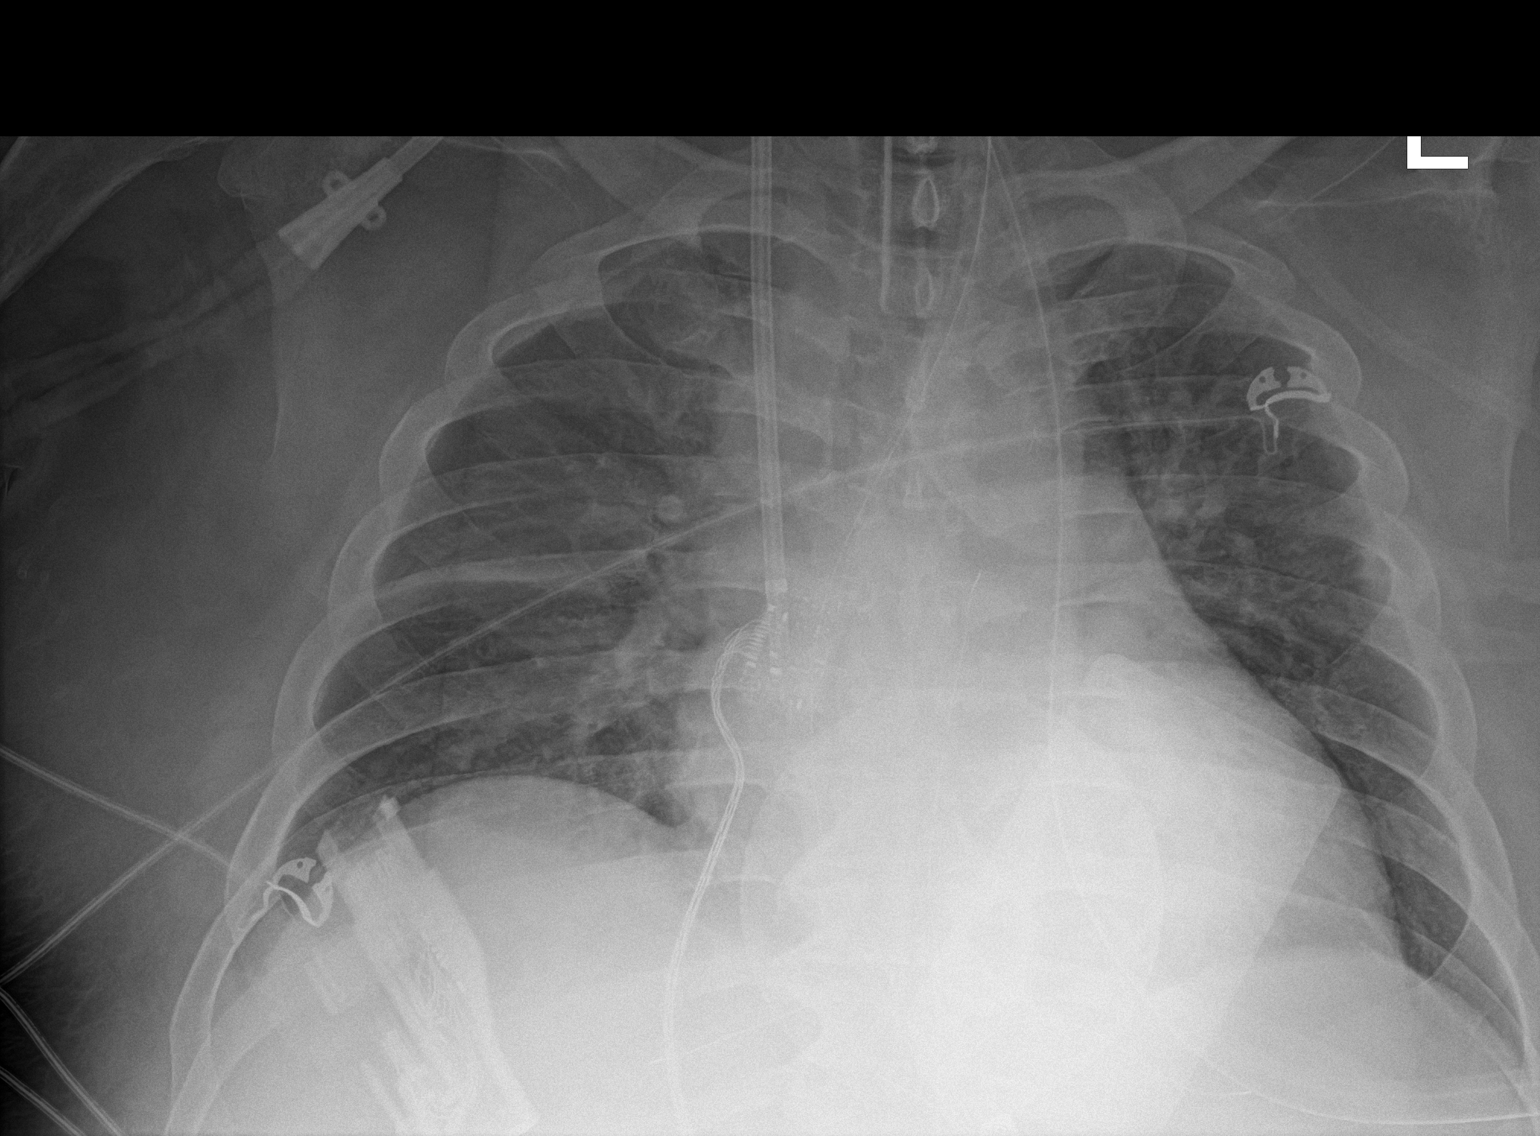

[2 of 2 positions shown; findings below may reference images not displayed]

FINDINGS: Endotracheal tube tip 4.9 cm from the carina. Enteric tube in place,
tip below the diaphragm not included in the field of view.
Right-sided dialysis catheter tip in the SVC. Stable cardiomegaly.
Unchanged mediastinal contours. Linear opacity in the right mid lung
likely fluid in the fissure. Blunting of the right costophrenic
angle. Small peripheral opacity in the left mid lung zone. Overlying
artifact limits detailed assessment. Suspect central vascular
congestion. No pneumothorax.
IMPRESSION: 1. Endotracheal tube tip 4.9 cm from the carina.
2. Enteric tube in place, tip below the diaphragm not included in
the field of view.
3. Vascular congestion with right pleural effusion of fluid in the
fissures. Cardiomegaly is stable.
4. Peripheral opacity in the left mid lung zone nonspecific. This
may be infectious, atelectasis, or pleural fluid in the fissure.

## 2019-04-10 IMAGING — DX DG ABD PORTABLE 1V
1 series · 1 of 1 positions shown · non-contrast
Comparison: CT [DATE]

CLINICAL DATA: NG tube placement.

EXAM:
PORTABLE ABDOMEN - 1 VIEW

[abdomen kub]
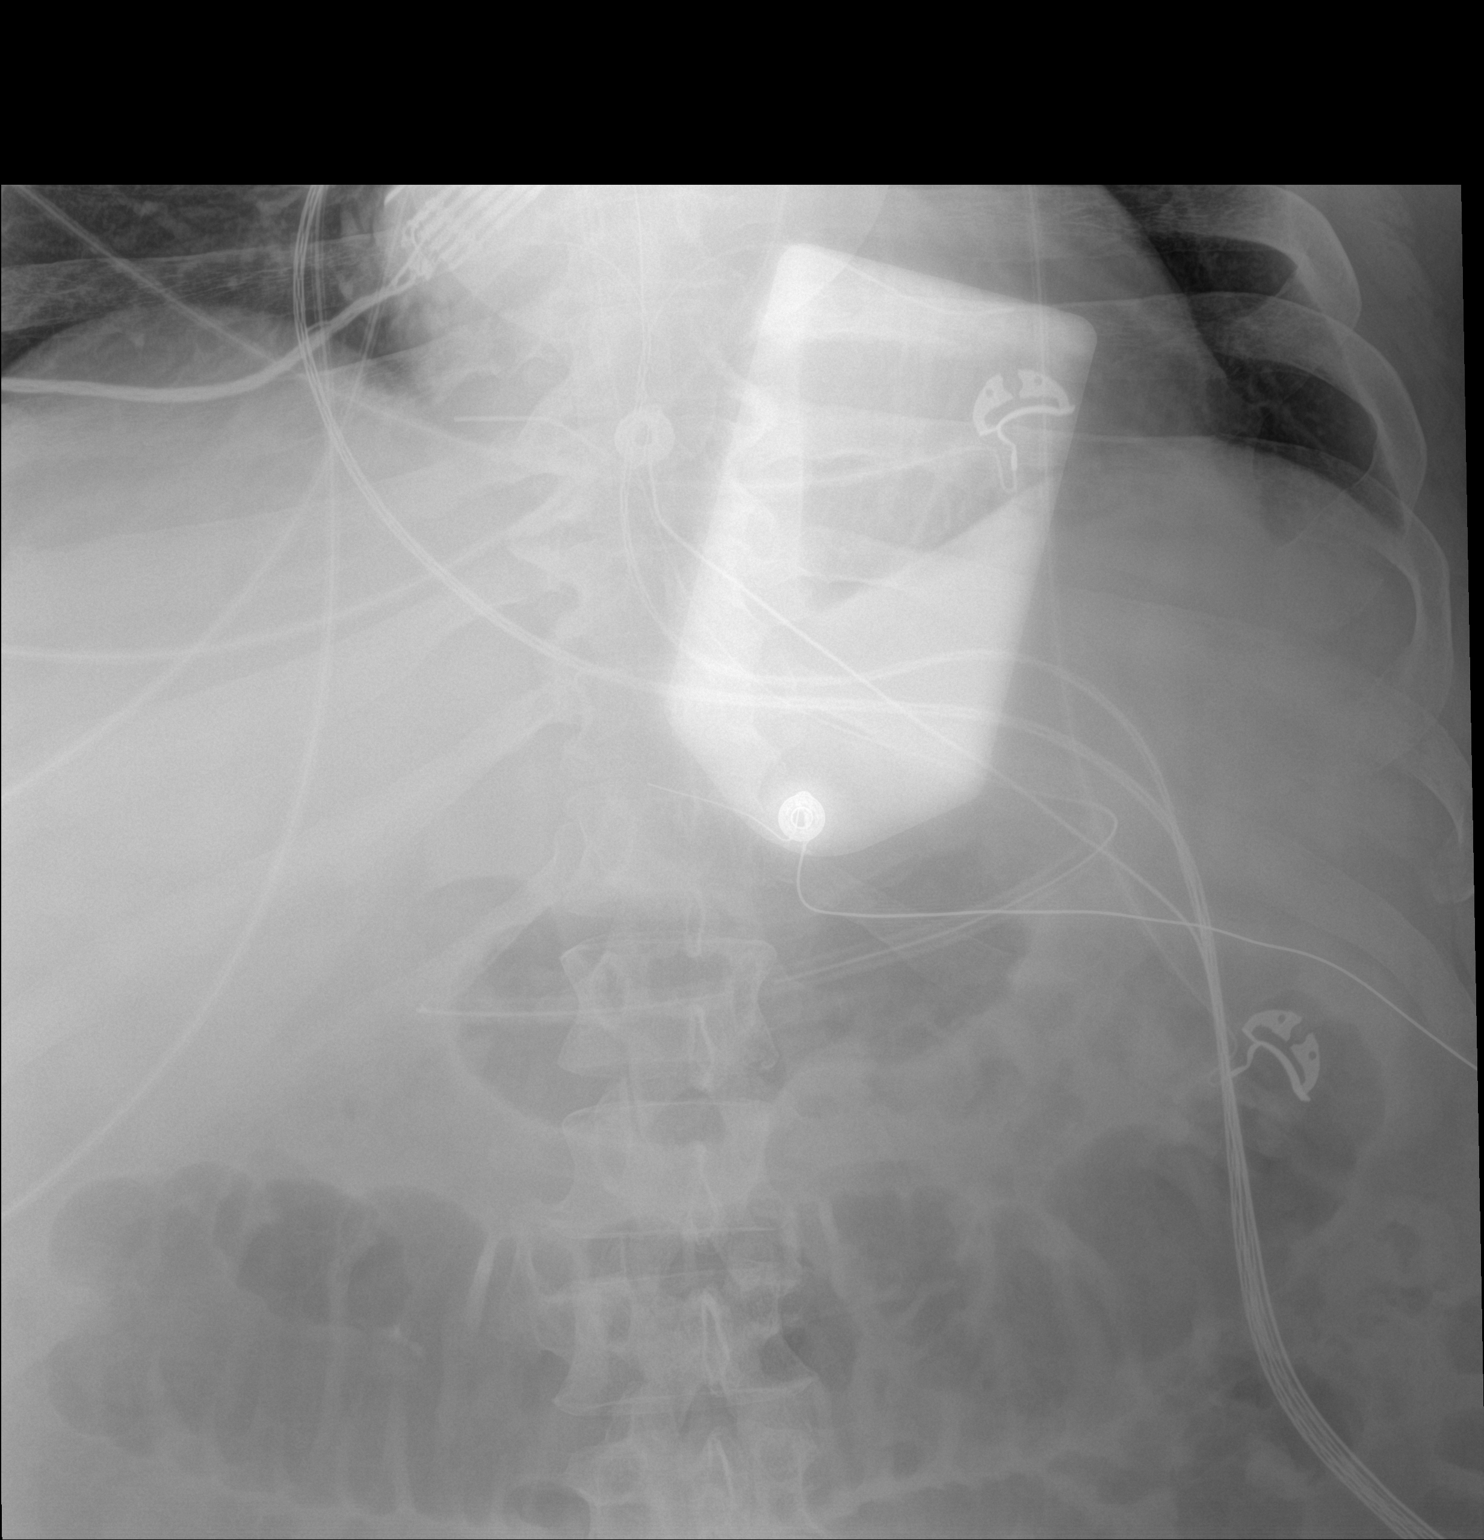

[1 of 1 positions shown; findings below may reference images not displayed]

FINDINGS: Tip and side port of the enteric tube below the diaphragm in the
stomach. No small bowel dilatation in the upper abdomen. Mild
gaseous distension of transverse colon which appears similar to
prior CT. No evidence of free air. Multiple monitoring devices
overlie the lower chest.
IMPRESSION: Tip and side port of the enteric tube below the diaphragm in the
stomach.

## 2019-04-10 IMAGING — DX DG CHEST 1V PORT
1 series · 1 of 1 positions shown · non-contrast
Comparison: [DATE], [DATE]

CLINICAL DATA: Central line placement

EXAM:
PORTABLE CHEST 1 VIEW

[chest ap]
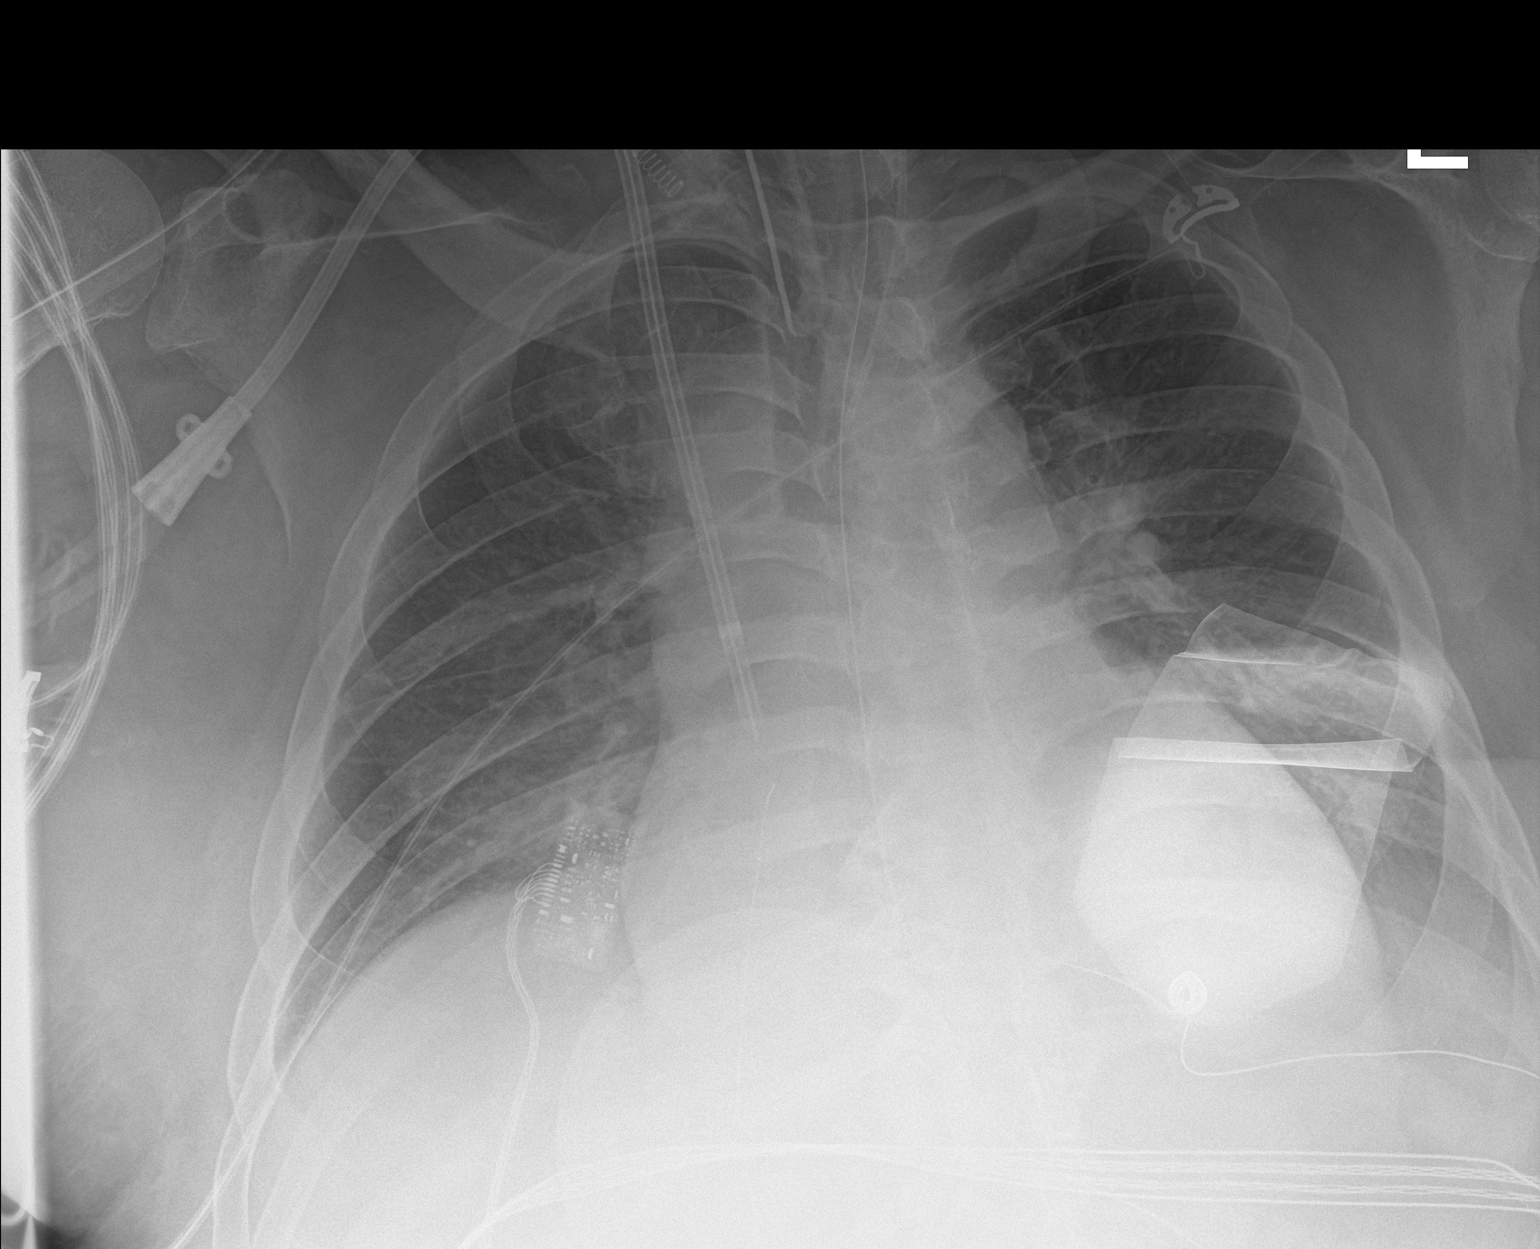

[1 of 1 positions shown; findings below may reference images not displayed]

FINDINGS: Endotracheal tube tip is about 4.5 cm superior to the carina.
Esophageal tube tip is below the diaphragm but non included.
Right-sided central venous catheter tip at the cavoatrial region.
New left IJ central venous catheter with tip visible medial to the
aortic arch, presumably over brachiocephalic region. No left
pneumothorax.

Cardiomegaly with vascular congestion and hazy perihilar edema. New
left lung base consolidation, atelectasis versus pneumonia.
IMPRESSION: 1. New left IJ central venous catheter with tip visible to the right
of the aortic arch, presumably over brachiocephalic region. No left
pneumothorax.
2. Support lines and tubes as above
3. Cardiomegaly with vascular congestion and hazy perihilar edema.
New consolidation in the left lung base, favor atelectasis.

## 2019-04-10 SURGERY — TRANSPOSITION, VEIN, BASILIC
Anesthesia: Monitor Anesthesia Care | Laterality: Left

## 2019-04-10 MED ORDER — LIDOCAINE-PRILOCAINE 2.5-2.5 % EX CREA
1.0000 "application " | TOPICAL_CREAM | CUTANEOUS | Status: DC | PRN
  Filled 2019-04-10: qty 5

## 2019-04-10 MED ORDER — ADULT MULTIVITAMIN LIQUID CH
15.0000 mL | Freq: Every day | ORAL | Status: DC
  Administered 2019-04-10 – 2019-04-12 (×2): 15 mL
  Filled 2019-04-10 (×4): qty 15

## 2019-04-10 MED ORDER — FENTANYL CITRATE (PF) 100 MCG/2ML IJ SOLN
50.0000 ug | INTRAMUSCULAR | Status: DC | PRN
  Administered 2019-04-10: 100 ug via INTRAVENOUS
  Administered 2019-04-10: 200 ug via INTRAVENOUS
  Administered 2019-04-10 (×3): 100 ug via INTRAVENOUS
  Administered 2019-04-11: 50 ug via INTRAVENOUS
  Administered 2019-04-11: 100 ug via INTRAVENOUS
  Filled 2019-04-10 (×3): qty 2
  Filled 2019-04-10: qty 4
  Filled 2019-04-10 (×3): qty 2

## 2019-04-10 MED ORDER — CHLORHEXIDINE GLUCONATE 4 % EX LIQD
60.0000 mL | Freq: Once | CUTANEOUS | Status: DC
  Filled 2019-04-10: qty 60

## 2019-04-10 MED ORDER — FENTANYL CITRATE (PF) 100 MCG/2ML IJ SOLN
50.0000 ug | INTRAMUSCULAR | Status: DC | PRN
  Administered 2019-04-10: 50 ug via INTRAVENOUS
  Filled 2019-04-10: qty 2

## 2019-04-10 MED ORDER — SODIUM CHLORIDE 0.9 % IV BOLUS
500.0000 mL | Freq: Once | INTRAVENOUS | Status: AC
  Administered 2019-04-10: 500 mL via INTRAVENOUS

## 2019-04-10 MED ORDER — PENTAFLUOROPROP-TETRAFLUOROETH EX AERO: 1.0000 "application " | INHALATION_SPRAY | CUTANEOUS | Status: DC | PRN

## 2019-04-10 MED ORDER — HEPARIN BOLUS VIA INFUSION
3000.0000 [IU] | Freq: Once | INTRAVENOUS | Status: AC
  Administered 2019-04-10: 14:00:00 3000 [IU] via INTRAVENOUS
  Filled 2019-04-10: qty 3000

## 2019-04-10 MED ORDER — JUVEN PO PACK: 1.0000 | PACK | Freq: Two times a day (BID) | ORAL | Status: DC

## 2019-04-10 MED ORDER — PIPERACILLIN-TAZOBACTAM IN DEX 2-0.25 GM/50ML IV SOLN
2.2500 g | Freq: Four times a day (QID) | INTRAVENOUS | Status: DC
  Administered 2019-04-10 – 2019-04-11 (×4): 2.25 g via INTRAVENOUS
  Filled 2019-04-10 (×5): qty 50

## 2019-04-10 MED ORDER — SODIUM CHLORIDE 0.9 % IV SOLN: 100.0000 mL | INTRAVENOUS | Status: DC | PRN

## 2019-04-10 MED ORDER — HEPARIN SODIUM (PORCINE) 1000 UNIT/ML DIALYSIS: 1000.0000 [IU] | INTRAMUSCULAR | Status: DC | PRN

## 2019-04-10 MED ORDER — PRO-STAT SUGAR FREE PO LIQD: 30.0000 mL | Freq: Three times a day (TID) | ORAL | Status: DC

## 2019-04-10 MED ORDER — HEPARIN SODIUM (PORCINE) 1000 UNIT/ML IJ SOLN
INTRAMUSCULAR | Status: AC
  Filled 2019-04-10: qty 4

## 2019-04-10 MED ORDER — B COMPLEX-C PO TABS
1.0000 | ORAL_TABLET | Freq: Every day | ORAL | Status: DC
  Administered 2019-04-10 – 2019-04-12 (×2): 1
  Filled 2019-04-10 (×2): qty 1

## 2019-04-10 MED ORDER — PIPERACILLIN-TAZOBACTAM 3.375 G IVPB
3.3750 g | Freq: Two times a day (BID) | INTRAVENOUS | Status: DC
  Filled 2019-04-10: qty 50

## 2019-04-10 MED ORDER — HEPARIN SODIUM (PORCINE) 1000 UNIT/ML DIALYSIS
4000.0000 [IU] | Freq: Once | INTRAMUSCULAR | Status: AC
  Administered 2019-04-10: 12:00:00 4000 [IU] via INTRAVENOUS_CENTRAL

## 2019-04-10 MED ORDER — VANCOMYCIN HCL 10 G IV SOLR
2000.0000 mg | Freq: Once | INTRAVENOUS | Status: AC
  Administered 2019-04-10: 2000 mg via INTRAVENOUS
  Filled 2019-04-10: qty 2000

## 2019-04-10 MED ORDER — VITAL HIGH PROTEIN PO LIQD: 1000.0000 mL | ORAL | Status: DC

## 2019-04-10 MED ORDER — FAMOTIDINE IN NACL 20-0.9 MG/50ML-% IV SOLN
20.0000 mg | Freq: Two times a day (BID) | INTRAVENOUS | Status: DC
  Administered 2019-04-10: 20 mg via INTRAVENOUS
  Filled 2019-04-10 (×2): qty 50

## 2019-04-10 MED ORDER — VANCOMYCIN HCL IN DEXTROSE 1-5 GM/200ML-% IV SOLN
1000.0000 mg | INTRAVENOUS | Status: DC
  Administered 2019-04-10: 1000 mg via INTRAVENOUS
  Filled 2019-04-10 (×2): qty 200

## 2019-04-10 MED ORDER — HEPARIN BOLUS VIA INFUSION
3000.0000 [IU] | Freq: Once | INTRAVENOUS | Status: AC
  Administered 2019-04-10: 3000 [IU] via INTRAVENOUS
  Filled 2019-04-10: qty 3000

## 2019-04-10 MED ORDER — LIDOCAINE HCL (PF) 1 % IJ SOLN: 5.0000 mL | INTRAMUSCULAR | Status: DC | PRN

## 2019-04-10 MED ORDER — CHLORHEXIDINE GLUCONATE 0.12% ORAL RINSE (MEDLINE KIT)
15.0000 mL | Freq: Two times a day (BID) | OROMUCOSAL | Status: DC
  Administered 2019-04-10 – 2019-04-11 (×3): 15 mL via OROMUCOSAL

## 2019-04-10 MED ORDER — PIPERACILLIN-TAZOBACTAM 3.375 G IVPB 30 MIN
3.3750 g | Freq: Once | INTRAVENOUS | Status: AC
  Administered 2019-04-10: 3.375 g via INTRAVENOUS
  Filled 2019-04-10: qty 50

## 2019-04-10 MED ORDER — ALTEPLASE 2 MG IJ SOLR: 2.0000 mg | Freq: Once | INTRAMUSCULAR | Status: DC | PRN

## 2019-04-10 MED ORDER — ORAL CARE MOUTH RINSE
15.0000 mL | OROMUCOSAL | Status: DC
  Administered 2019-04-10 – 2019-04-11 (×14): 15 mL via OROMUCOSAL

## 2019-04-10 MED ORDER — SODIUM CHLORIDE 0.9 % IV SOLN
INTRAVENOUS | Status: DC
  Administered 2019-04-10: 125 mL/h via INTRAVENOUS
  Administered 2019-04-10 (×3): via INTRAVENOUS
  Administered 2019-04-11: 05:00:00 125 mL/h via INTRAVENOUS

## 2019-04-10 MED ORDER — FAMOTIDINE IN NACL 20-0.9 MG/50ML-% IV SOLN
20.0000 mg | INTRAVENOUS | Status: DC
  Administered 2019-04-10: 20 mg via INTRAVENOUS

## 2019-04-10 MED ORDER — HEPARIN BOLUS VIA INFUSION
4000.0000 [IU] | Freq: Once | INTRAVENOUS | Status: AC
  Administered 2019-04-10: 21:00:00 4000 [IU] via INTRAVENOUS
  Filled 2019-04-10: qty 4000

## 2019-04-10 MED ORDER — PHENYLEPHRINE HCL-NACL 10-0.9 MG/250ML-% IV SOLN
0.0000 ug/min | INTRAVENOUS | Status: DC
  Administered 2019-04-10: 40 ug/min via INTRAVENOUS
  Administered 2019-04-10 (×2): 50 ug/min via INTRAVENOUS
  Filled 2019-04-10 (×2): qty 250

## 2019-04-10 MED ORDER — HEPARIN (PORCINE) 25000 UT/250ML-% IV SOLN
2750.0000 [IU]/h | INTRAVENOUS | Status: DC
  Administered 2019-04-10: 1600 [IU]/h via INTRAVENOUS
  Administered 2019-04-10: 1800 [IU]/h via INTRAVENOUS
  Administered 2019-04-11: 2200 [IU]/h via INTRAVENOUS
  Administered 2019-04-11: 2500 [IU]/h via INTRAVENOUS
  Administered 2019-04-12: 2750 [IU]/h via INTRAVENOUS
  Administered 2019-04-12: 2700 [IU]/h via INTRAVENOUS
  Administered 2019-04-12 – 2019-04-13 (×2): 2750 [IU]/h via INTRAVENOUS
  Filled 2019-04-10 (×8): qty 250

## 2019-04-10 MED FILL — Medication: Qty: 1 | Status: AC

## 2019-04-10 NOTE — Plan of Care (Signed)
  Problem: Clinical Measurements: Goal: Respiratory complications will improve Outcome: Progressing   Problem: Safety: Goal: Ability to remain free from injury will improve Outcome: Progressing   Problem: Pain Managment: Goal: General experience of comfort will improve Outcome: Not Progressing   Problem: Skin Integrity: Goal: Risk for impaired skin integrity will decrease Outcome: Not Progressing

## 2019-04-10 NOTE — Progress Notes (Addendum)
Nutrition Follow-up  DOCUMENTATION CODES:   Morbid obesity  INTERVENTION:   Recommend Vital HP _0 /hr + Prostat 3m TID via tube  Recommend free water flushes 313mq4 hours to maintain tube patency   Regimen provides 1500kcal/day, 150g/day protein, 118328may free water  B-complex with C daily via tube   Liquid MVI daily via tube   Recommend Juven Fruit Punch BID via tube, each serving provides 95kcal and 2.5g of protein (amino acids glutamine and arginine)  NUTRITION DIAGNOSIS:   Increased nutrient needs related to wound healing as evidenced by increased estimated needs. Ongoing  GOAL:   Provide needs based on ASPEN/SCCM guidelines -Not met  MONITOR:   Vent status, Labs, Weight trends, Skin, I & O's  ASSESSMENT:   [age]ar old with history of ESRD on TTS HD, hypertension, hyperlipidemia sent from SNF for evaluation of fever, confusion right lower extremity pain concerning for infection.   Pt sedated and ventilated. OGT in place. No plans to initiate tube feeds today. Recommend add supplements and vitamins to support wound healing and replace losses from HD. Per chart, pt down ~25lbs since admit; pt's weight stable for the past week.   Medications reviewed and include: Celexa, darbopoetin, ferric citrate, fentanyl, melatonin, rena-vite, protonix, pepcid, heparin, neo-synephrine, zosyn, Na thiosulfate, vancomycin    Labs reviewed: K 4.4 wnl, BUN 39(H), creat 5.70(H), P 4.4 wnl Wbc- 29.9(H), Hgb 8.6(L), Hct 27.7(L) iPTH- 100(H)- 8/15  Patient is currently intubated on ventilator support MV: 9.8 L/min Temp (24hrs), Avg:99.7 F (37.6 C), Min:98.2 F (36.8 C), Max:103.2 F (39.6 C)  Propofol: none   MAP- >70m4m Diet Order:   Diet Order            Diet NPO time specified  Diet effective midnight        Diet NPO time specified  Diet effective now             EDUCATION NEEDS:   No education needs have been identified at this time  Skin:  Skin  Assessment: Reviewed RN Assessment Skin Integrity Issues:: Other (Comment) Other: R leg/R pretibial wound, L leg lesion (calciphylaxis), pressure injury penis  Last BM:  9/21  Height:   Ht Readings from Last 1 Encounters:  04/10/19 _1  (1.778 m)    Weight:   Wt Readings from Last 1 Encounters:  04/10/19 111.5 kg    Ideal Body Weight:  71 kg(Adjusted for L BKA)  BMI:  Body mass index is 35.27 kg/m.  Estimated Nutritional Needs:   Kcal:  1225-1559kcal/day  Protein:  >150g/day  Fluid:  UOP + 1L  CaseKoleen Distance RD, LDN Pager #- [phone]#- [phone] Hours Pager: [phone]

## 2019-04-10 NOTE — Progress Notes (Signed)
[facility] NEPHROLOGY PROGRESS NOTE  Assessment/ Plan: Pt is a 48 y.o. yo male with ESRD on HD, hypertension, admitted with sepsis due to right lower extremity cellulitis, calciphylaxis wound.  Patient had cardiac arrest on 9/21 and moved to ICU. HD orders: [facility] MWF 4 hrs 180NRe 400/800 120 kg 2.0 K/ 2.25 Ca (Change to 4.0 K /2.0 Ca bath on DC) -Heparin 4000 units IV TIW -Calcitriol 0.25 mcg PO TIW  #Shock suspect septic shock: Currently on vancomycin and Zosyn pending culture data.  Also receiving heparin infusion.  Plan for CT chest angiogram noted.  Only small dose of phenylephrine.  # ESRD: Patient had serial dialysis in the beginning now on MWF schedule.  Patient on low-dose phenylephrine with acceptable blood pressure.  Hopefully he can tolerate intermittent hemodialysis.  If blood pressure drops then he will need CRRT.  I have discussed with ICU team.  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  Continue low calcium bath, avoid VDRA.  Continue sodium thiosulfate.  Wound care and pain management per primary team.  #Acute respiratory failure with hypoxia: Currently on mechanical ventilator.  PCCM is following.  # Anemia: Monitor hemoglobin.  Continue ESA.  # Secondary hyperparathyroidism: Use low calcium bath, avoid calcium based binders.  Continue Auryxia able to take orally.  #Hypotension/volume: Currently on low-dose pressor.  Monitor blood pressure.  Subjective: Seen and examined in ICU.  Overnight event noted.  Currently on mechanical ventilator and on pressor.  FiO2 40%.  Patient is sedated. Objective Vital signs in last 24 hours: Vitals:   04/10/19 0824 04/10/19 0845 04/10/19 0900 04/10/19 0912  BP: 111/73 124/77 121/77 121/82  Pulse: 68 68 68   Resp: '16 16 16   ' Temp:    98.5 F (36.9 C)  TempSrc:    Axillary  SpO2: 100% 100% 100%   Weight:   111.5 kg   Height:       Weight change: -0.4  kg  Intake/Output Summary (Last 24 hours) at 04/10/2019 0957 Last data filed at 04/10/2019 0700 Gross per 24 hour  Intake 2446.83 ml  Output 0 ml  Net 2446.83 ml       Labs: Basic Metabolic Panel: Recent Labs  Lab 04/09/19 0802 04/10/19 0007 04/10/19 0340 04/10/19 0518  NA 131* 130* 137 135  K 3.9 4.3 4.5 4.4  CL 94* 95*  --  103  CO2 24 23  --  20*  GLUCOSE 116* 130*  --  152*  BUN 25* 38*  --  39*  CREATININE 4.10* 5.54*  --  5.70*  CALCIUM 8.3* 8.2*  --  8.3*  PHOS 3.7 3.4  --  4.4   Liver Function Tests: Recent Labs  Lab 04/09/19 0802 04/10/19 0007 04/10/19 0518  ALBUMIN 1.9* 1.8* 1.7*   No results for input(s): LIPASE, AMYLASE in the last 168 hours. No results for input(s): AMMONIA in the last 168 hours. CBC: Recent Labs  Lab 04/04/19 0504  04/07/19 0402 04/08/19 0932 04/09/19 0802 04/10/19 0007 04/10/19 0340 04/10/19 0518  WBC 20.3*   < > 13.6* 13.0* 14.5* 35.1*  --  33.0*  NEUTROABS 12.9*  --   --   --   --   --   --   --   HGB 8.2*   < > 8.1* 7.2* 8.2* 8.8* 9.9* 8.5*  HCT 26.0*   < > 26.2* 24.4* 28.3* 29.0* 29.0* 28.1*  MCV 88.1   < > 89.7 91.4 94.0  92.4  --  92.4  PLT 361   < > 332 291 288 333  --  300   < > = values in this interval not displayed.   Cardiac Enzymes: No results for input(s): CKTOTAL, CKMB, CKMBINDEX, TROPONINI in the last 168 hours. CBG: Recent Labs  Lab 04/09/19 2259 04/10/19 0356 04/10/19 0753  GLUCAP 128* 127* 133*    Iron Studies: No results for input(s): IRON, TIBC, TRANSFERRIN, FERRITIN in the last 72 hours. Studies/Results: Ct Head Wo Contrast  Result Date: 04/10/2019 CLINICAL DATA:  Altered mental status with unclear cause EXAM: CT HEAD WITHOUT CONTRAST TECHNIQUE: Contiguous axial images were obtained from the base of the skull through the vertex without intravenous contrast. COMPARISON:  None. FINDINGS: Brain: No evidence of acute infarction, hemorrhage, hydrocephalus, extra-axial collection or mass lesion/mass  effect. 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 Vascular: No hyperdense vessel. End-stage renal disease with extensive vessel calcification in the scalp. Skull: Normal. Negative for fracture or focal lesion. Sinuses/Orbits: No acute finding. IMPRESSION: No acute finding. Electronically Signed   By: Monte Fantasia M.D.   On: 04/10/2019 06:02   Dg Chest Port 1 View  Result Date: 04/10/2019 CLINICAL DATA:  Central line placement EXAM: PORTABLE CHEST 1 VIEW COMPARISON:  04/10/2019, 03/28/2019 FINDINGS: Endotracheal tube tip is about 4.5 cm superior to the carina. Esophageal tube tip is below the diaphragm but non included. Right-sided central venous catheter tip at the cavoatrial region. New left IJ central venous catheter with tip visible medial to the aortic arch, presumably over brachiocephalic region. No left pneumothorax. Cardiomegaly with vascular congestion and hazy perihilar edema. New left lung base consolidation, atelectasis versus pneumonia. IMPRESSION: 1. New left IJ central venous catheter with tip visible to the right of the aortic arch, presumably over brachiocephalic region. No left pneumothorax. 2. Support lines and tubes as above 3. Cardiomegaly with vascular congestion and hazy perihilar edema. New consolidation in the left lung base, favor atelectasis. Electronically Signed   By: Donavan Foil M.D.   On: 04/10/2019 03:18   Dg Chest Port 1 View  Result Date: 04/10/2019 CLINICAL DATA:  Intubation. EXAM: PORTABLE CHEST 1 VIEW COMPARISON:  Radiograph 03/28/2019 FINDINGS: Endotracheal tube tip 4.9 cm from the carina. Enteric tube in place, tip below the diaphragm not included in the field of view. Right-sided dialysis catheter tip in the SVC. Stable cardiomegaly. Unchanged mediastinal contours. Linear opacity in the right mid lung likely fluid in the fissure. Blunting of the right costophrenic angle. Small peripheral opacity in the left mid lung zone. Overlying artifact limits  detailed assessment. Suspect central vascular congestion. No pneumothorax. IMPRESSION: 1. Endotracheal tube tip 4.9 cm from the carina. 2. Enteric tube in place, tip below the diaphragm not included in the field of view. 3. Vascular congestion with right pleural effusion of fluid in the fissures. Cardiomegaly is stable. 4. Peripheral opacity in the left mid lung zone nonspecific. This may be infectious, atelectasis, or pleural fluid in the fissure. Electronically Signed   By: Keith Rake M.D.   On: 04/10/2019 01:36   Dg Abd Portable 1v  Result Date: 04/10/2019 CLINICAL DATA:  NG tube placement. EXAM: PORTABLE ABDOMEN - 1 VIEW COMPARISON:  CT 02/11/2019 FINDINGS: Tip and side port of the enteric tube below the diaphragm in the stomach. No small bowel dilatation in the upper abdomen. Mild gaseous distension of transverse colon which appears similar to prior CT. No evidence of free air. Multiple monitoring devices overlie the lower chest.  IMPRESSION: Tip and side port of the enteric tube below the diaphragm in the stomach. Electronically Signed   By: Keith Rake M.D.   On: 04/10/2019 01:37    Medications: Infusions: . sodium chloride    . sodium chloride    . sodium chloride 125 mL/hr at 04/10/19 0700  . famotidine (PEPCID) IV 20 mg (04/10/19 0225)  . heparin 1,600 Units/hr (04/10/19 0700)  . phenylephrine (NEO-SYNEPHRINE) Adult infusion 10 mcg/min (04/10/19 0915)  . piperacillin-tazobactam (ZOSYN)  IV    . sodium thiosulfate infusion for calciphylaxis 25 g (04/07/19 1434)  . vancomycin      Scheduled Medications: . atorvastatin  40 mg Oral Q2000  . bethanechol  25 mg Oral TID  . chlorhexidine  60 mL Topical Once  . chlorhexidine  60 mL Topical Once   And  . [START ON 04/11/2019] chlorhexidine  60 mL Topical Once  . chlorhexidine gluconate (MEDLINE KIT)  15 mL Mouth Rinse BID  . Chlorhexidine Gluconate Cloth  6 each Topical Q0600  . Chlorhexidine Gluconate Cloth  6 each Topical  Q0600  . citalopram  40 mg Oral Daily  . darbepoetin (ARANESP) injection - DIALYSIS  150 mcg Intravenous Q Wed-HD  . feeding supplement (NEPRO CARB STEADY)  237 mL Oral Q24H  . feeding supplement (PRO-STAT SUGAR FREE 64)  30 mL Oral BID  . fentaNYL  1 patch Transdermal Q72H  . ferric citrate  420 mg Oral TID WC  . gabapentin  300 mg Oral QHS  . heparin      . heparin  4,000 Units Dialysis Once in dialysis  . mouth rinse  15 mL Mouth Rinse 10 times per day  . Melatonin  4.5 mg Oral QHS  . multivitamin  1 tablet Oral QHS  . pantoprazole  40 mg Oral Daily  . potassium chloride  20 mEq Oral BID  . tamsulosin  0.4 mg Oral QPC breakfast    have reviewed scheduled and prn medications.  Physical Exam: General: Intubated and sedated. Heart:RRR, s1s2 nl Lungs: Coarse breath sound bilateral, no wheezing Abdomen:soft,  non-distended Extremities: Left BKA with dressing applied, no edema. Dialysis Access: Right IJ TDC, left aVF   Antara Brecheisen Prasad Lorry Anastasi 04/10/2019,9:57 AM  LOS: 13 days  Pager: [phone]

## 2019-04-10 NOTE — Progress Notes (Signed)
OT Cancellation Note  Patient Details Name: Michael Riggs MRN: [id number] DOB: [birth year]   Cancelled Treatment:    Reason Eval/Treat Not Completed: Medical issues which prohibited therapy; pt newly intubated. Will follow up for OT eval as able.  Lou Cal, OT [facility] Pager [phone] Office [phone]   Raymondo Band 04/10/2019, 11:32 AM

## 2019-04-10 NOTE — Consult Note (Signed)
NAME:  Michael Riggs, MRN:  [id number], DOB:  Sep 10, 1970, LOS: 20 ADMISSION DATE:  03/28/2019, CONSULTATION DATE: 04/10/2019 REFERRING MD: [facility] hospitalist, CHIEF COMPLAINT: Fever hypotension arrest  Brief History   Patient is a 48 year old male with a complex recent medical history essentially admitted 3 months ago almost continuous stay in the hospital with complications related to renal failure initiation of dialysis.  History of present illness   Patient is a 48 year old male admitted in late July end-stage renal disease and started on dialysis at that time.  Time in early August he developed some blistering on his right leg.  Over the last 2 months the right lower extremity has become more necrotic felt secondary to a combination of chronic infection, diabetes mellitus, peripheral vascular disease.  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  He was discharged to rehab for further physical therapy with severe deconditioning and intermittent confusion felt secondary to uremia sometime in late August.  He was readmitted earlier this month with worsening in the right lower extremity wound infection.  Earlier this evening he became progressively delirious, spiked a temperature to 103 with sudden hypotension and complete unresponsiveness.  He was resuscitated intubated moved to the ICU where I am seeing him.  At the time of my evaluation the patient is on phenylephrine with a systolic blood pressure of 80.  He only has peripheral IV access at this time.  The etiology of his fever and acute decompensation is not entirely clear.  Due to worsening hypotension will be unable to get a CT scan of the chest to rule out the possibility of pulmonary embolus.  We are in the process of culturing him and resuming vancomycin and Zosyn empirically.  We will also start full dose heparinization for possible pulmonary embolus.  If more stable in the morning we can confirm her disavow a PE at that time.  If there is clinical  situation will deteriorate through the night.  Past Medical History   Past Medical History:  Diagnosis Date  . Anemia   . Chronic kidney disease    Stage 5  . Depression   . Diabetes mellitus without complication ([place])   . Dyspnea   . GERD (gastroesophageal reflux disease)   . Hypertension    Patient Active Problem List   Diagnosis Date Noted  . Pruritus   . Scrotal pain   . Labile blood pressure   . Scrotal edema   . Status post below-knee amputation of left lower extremity ([place])   . Slow transit constipation   . Leukocytosis   . Sleep disturbance   . Essential hypertension   . Anemia of chronic disease   . Controlled type 2 diabetes mellitus with hyperglycemia, without long-term current use of insulin ([place])   . ESRD on dialysis ([place])   . Urinary retention   . Debility 02/09/2019  . Fluid overload 02/04/2019  . Hyperkalemia 01/30/2019  . ARF (acute renal failure) ([place]) 01/30/2019  . Hypokalemia 01/30/2019  . Hypoglycemia 01/30/2019  . Syphilis 01/30/2019  . Macrocytic anemia 01/30/2019  . End stage renal disease ([place])   . PDR (proliferative diabetic retinopathy) ([place]) 12/14/2013  . Venous hypertension 09/22/2013  . Diabetes mellitus type 2 in obese ([place]) 07/04/2013  . Habitual alcohol use 07/04/2013  . Obesity, Class II, BMI 35-39.9, with comorbidity 07/04/2013     Significant Hospital Events   Resuscitation and admission to the ICU 04/10/2019  Consults:  Vascular Nephrology  Procedures:  NA  Significant  Diagnostic Tests:  NA  Micro Data:  NA  Antimicrobials:  We will initiate vancomycin and Zosyn  Interim history/subjective:  NA  Objective   Blood pressure (!) 172/145, pulse (!) 129, temperature (!) 103.2 F (39.6 C), temperature source Axillary, resp. rate (!) 22, height _0  (1.778 m), weight 111.5 kg, SpO2 92 %.    Vent Mode: PRVC FiO2 (%):  [100 %] 100 % Set Rate:  [16 bmp] 16 bmp Vt Set:  [580 mL] 580 mL PEEP:   [5 cmH20] 5 cmH20 Plateau Pressure:  [21 cmH20] 21 cmH20   Intake/Output Summary (Last 24 hours) at 04/10/2019 9244 Last data filed at 04/09/2019 2200 Gross per 24 hour  Intake 560 ml  Output 0 ml  Net 560 ml   Filed Weights   04/08/19 1106 04/09/19 0345 04/10/19 0105  Weight: 111.9 kg 113 kg 111.5 kg    Examination: General: Pale clammy but diaphoretic obese male intubated and unresponsive HENT: Within normal limits Lungs: Diminished bilateral breath sounds Cardiovascular: Regular rhythm no murmurs or gallops Abdomen: Benign bowel sounds positive Extremities: Left AKA right leg with necrotic appearing wounds over the lower one half of the leg Neuro: Unresponsive except to deep pain GU: Unremarkable  Resolved Hospital Problem list   NA  Assessment & Plan:  1.  Status post arrest: Outlined above the most likely 2 etiologies are line sepsis or a pulmonary embolus.  Will be unable to get a CT scan at this time due to his tenuous blood pressure.  If this improves we could get when then.  Empirically start heparin vancomycin and Zosyn.  Best practice:  Diet: N.p.o. Pain/Anxiety/Delirium protocol (if indicated): Fentanyl PRN VAP protocol (if indicated): Yes DVT prophylaxis: Full dose heparin GI prophylaxis: Yes Glucose control: CBG Mobility: Bedrest Code Status: Full Family Communication: Discussed with patient's next of kin his brother and his sister-in-law Disposition: ICU  Labs   CBC: Recent Labs  Lab 04/03/19 0447 04/04/19 0504  04/06/19 0404 04/07/19 0402 04/08/19 0932 04/09/19 0802 04/10/19 0007  WBC 18.1* 20.3*   < > 16.8* 13.6* 13.0* 14.5* 35.1*  NEUTROABS 12.6* 12.9*  --   --   --   --   --   --   HGB 8.2* 8.2*   < > 8.1* 8.1* 7.2* 8.2* 8.8*  HCT 27.5* 26.0*   < > 25.5* 26.2* 24.4* 28.3* 29.0*  MCV 90.8 88.1   < > 86.7 89.7 91.4 94.0 92.4  PLT 342 361   < > 335 332 291 288 333   < > = values in this interval not displayed.    Basic Metabolic  Panel: Recent Labs  Lab 04/03/19 0934  04/06/19 0404 04/07/19 0402 04/08/19 0932 04/09/19 0802 04/10/19 0007  NA 130*   < > 136 134* 134* 131* 130*  K 3.3*   < > 3.6 3.6 4.2 3.9 4.3  CL 93*   < > 96* 96* 96* 94* 95*  CO2 23   < > 20* _1 GLUCOSE 153*   < > 113* 121* 99 116* 130*  BUN 57*   < > 30* 20 20 25* 38*  CREATININE 8.38*   < > 5.19* 4.28* 4.02* 4.10* 5.54*  CALCIUM 8.2*   < > 8.6* 8.6* 8.6* 8.3* 8.2*  MG  --    < > 1.9 1.9 2.0 1.8 1.7  PHOS 6.4*  --   --   --  3.4 3.7 3.4   < > =  values in this interval not displayed.   GFR: Estimated Creatinine Clearance: 20.6 mL/min (A) (by C-G formula based on SCr of 5.54 mg/dL (H)). Recent Labs  Lab 04/07/19 0402 04/08/19 0932 04/09/19 0802 04/10/19 0007  WBC 13.6* 13.0* 14.5* 35.1*  LATICACIDVEN  --   --   --  1.6    Liver Function Tests: Recent Labs  Lab 04/03/19 0934 04/08/19 0932 04/09/19 0802 04/10/19 0007  ALBUMIN 1.8* 1.7* 1.9* 1.8*   No results for input(s): LIPASE, AMYLASE in the last 168 hours. No results for input(s): AMMONIA in the last 168 hours.  ABG    Component Value Date/Time   PHART 7.296 (L) 04/10/2019 0045   PCO2ART 48.6 (H) 04/10/2019 0045   PO2ART 132 (H) 04/10/2019 0045   HCO3 22.4 04/10/2019 0045   TCO2 12 (L) 01/30/2019 2339   ACIDBASEDEF 2.8 (H) 04/10/2019 0045   O2SAT 97.5 04/10/2019 0045     Coagulation Profile: No results for input(s): INR, PROTIME in the last 168 hours.  Cardiac Enzymes: No results for input(s): CKTOTAL, CKMB, CKMBINDEX, TROPONINI in the last 168 hours.  HbA1C: Hgb A1c MFr Bld  Date/Time Value Ref Range Status  01/31/2019 11:18 AM 5.0 4.8 - 5.6 % Final    Comment:    (NOTE) Pre diabetes:          5.7%-6.4% Diabetes:              >6.4% Glycemic control for   <7.0% adults with diabetes     CBG: Recent Labs  Lab 04/03/19 0623 04/03/19 0839 04/09/19 2259  GLUCAP 153* 138* 128*    Review of Systems:   Unable to obtain  Past Medical  History  He,  has a past medical history of Anemia, Depression, Diabetes mellitus with peripheral vascular disease ([place]), Dyslipidemia, Dyspnea, ESRD (end stage renal disease) on dialysis ([place]) (01/2019), GERD (gastroesophageal reflux disease), Hypertension, Physical deconditioning, and Syphilis (01/2019).   Surgical History    Past Surgical History:  Procedure Laterality Date  . AV FISTULA PLACEMENT Left 01/09/2019   Procedure: ARTERIOVENOUS (AV) FISTULA CREATION LEFT UPPER ARM;  Surgeon: [name], MD;  Location: [facility] OR;  Service: Vascular;  Laterality: Left;  . below the knee amputation Left   . EYE SURGERY    . IR FLUORO GUIDE CV LINE RIGHT  01/31/2019  . IR FLUORO GUIDE CV LINE RIGHT  02/03/2019  . IR US GUIDE VASC ACCESS RIGHT  01/31/2019  . IR US GUIDE VASC ACCESS RIGHT  02/03/2019     Social History   reports that he quit smoking about 2 months ago. His smoking use included cigarettes. He started smoking about 4 months ago. He has never used smokeless tobacco. He reports previous alcohol use. He reports previous drug use.   Family History   His family history is not on file.   Allergies No Known Allergies   Home Medications  Prior to Admission medications   Medication Sig Start Date End Date Taking? Authorizing Provider  acetaminophen (TYLENOL) 325 MG tablet Take 2 tablets (650 mg total) by mouth every 6 (six) hours as needed for mild pain (or Fever >/= 101). 03/13/19  Yes Angiulli, Lavon Paganini, PA-C  albuterol (VENTOLIN HFA) 108 (90 Base) MCG/ACT inhaler Inhale 2 puffs into the lungs every 6 (six) hours as needed for wheezing or shortness of breath.   Yes [provider]  atorvastatin (LIPITOR) 40 MG tablet Take 1 tablet (40 mg total) by mouth daily. 03/13/19  Yes Angiulli, Lavon Paganini, PA-C  bethanechol (URECHOLINE) 25 MG tablet Take 1 tablet (25 mg total) by mouth 3 (three) times daily. 03/13/19  Yes Angiulli, Lavon Paganini, PA-C  bisacodyl (DULCOLAX) 5 MG EC tablet Take 10 mg by  mouth daily.   Yes [provider]  citalopram (CELEXA) 40 MG tablet Take 40 mg by mouth daily.  12/13/16  Yes [provider]  furosemide (LASIX) 80 MG tablet Take 1 tablet (80 mg total) by mouth daily. 03/13/19  Yes Angiulli, Lavon Paganini, PA-C  gabapentin (NEURONTIN) 300 MG capsule Take 1 capsule (300 mg total) by mouth at bedtime. Patient taking differently: Take 200 mg by mouth 3 (three) times daily.  02/08/19  Yes Alma Friendly, MD  Lidocaine-Prilocaine &Lido HCl 2.5-2.5 & 3.88 % KIT Apply 1 application topically every Monday, Wednesday, and Friday. Apply to fistula and wrap with saran wrap site prior to dialysys   Yes [provider]  Melatonin 3 MG TABS Take 1.5 tablets (4.5 mg total) by mouth at bedtime. 02/08/19  Yes Alma Friendly, MD  methocarbamol (ROBAXIN) 500 MG tablet Take 1 tablet (500 mg total) by mouth every 8 (eight) hours as needed for muscle spasms. 03/13/19  Yes Angiulli, Lavon Paganini, PA-C  oxyCODONE (OXY IR/ROXICODONE) 5 MG immediate release tablet Take 5 mg by mouth 3 (three) times daily. Take an additional 5 mg every 8 hours as needed.   Yes [provider]  pantoprazole (PROTONIX) 40 MG tablet Take 1 tablet (40 mg total) by mouth daily. 03/13/19  Yes Angiulli, Lavon Paganini, PA-C  polyethylene glycol (MIRALAX / GLYCOLAX) 17 g packet Take 17 g by mouth daily. Patient taking differently: Take 17 g by mouth daily as needed for mild constipation or moderate constipation. 4-8 ounces 02/09/19  Yes Alma Friendly, MD  tamsulosin [facility]) 0.4 MG CAPS capsule Take 1 capsule (0.4 mg total) by mouth daily after breakfast. 03/13/19  Yes Angiulli, Lavon Paganini, PA-C     Critical care time: Over 35 minutes was spent in chart review, bedside evaluation and critical care planning.  Patient's prognosis seems poor at this point time.  Case was also discussed as above with his next of kin.

## 2019-04-10 NOTE — Progress Notes (Signed)
Since the beginning of the shift, pt has not looked well. Pt has been pale, confused, and running a fever. NP Bodenheimer was called to the bedside to assess pt earlier in the shift and again around 2300 and had ordered stat labs.   RN was called by NT to come into room quickly because pt was not responding. RN comes to assess and finds pt with frothy sputum coming out of mouth, pale, shaking, and having agonal breathing. A pulse was not felt on assessment. A code blue was called and CPR started. Rapid was also notified by RN. After 2 minutes of CPR, pulse was regained and pt was intubated. Pt was then stabilized and transferred to ICU room 3M11 around 0100. Family (brother and sister-in-law) were notified and given room number, as well as, number to the unit.   Prior to code, family (brother and sister-in-law) had just called this RN around 0000, voicing concerns of patients pain level and how he is always yelling out in pain when they call or see him. RN informed family that we are trying to manage his pain while, not making him anymore confused. RN explained that giving a patient a strong narcotic while they are already confused and lethargic can make those symptoms worse. Family stated that they do not care about his altered mental status and the risk of oversedation when it comes to him being in pain. Family stated that they wanted pt to be comfortable because it is very nerve racking to them to hear him in pain. RN explained that the MD would be made aware of family wanting comfort more than anything right now. Family was satisfied with this.   Eleanora Neighbor, RN

## 2019-04-10 NOTE — Progress Notes (Signed)
ANTICOAGULATION CONSULT NOTE - Initial Consult  Pharmacy Consult for heparin Indication: r/o PE  No Known Allergies  Patient Measurements: Height: 5\' 10"  (177.8 cm) Weight: 245 lb 13 oz (111.5 kg) IBW/kg (Calculated) : 73 Heparin Dosing Weight: 100kg  Vital Signs: Temp: 99.9 F (37.7 C) (09/21 1958) Temp Source: Oral (09/21 1958) BP: 99/60 (09/21 2030) Pulse Rate: 74 (09/21 2030)  Labs: Recent Labs    04/09/19 0802 04/10/19 0007 04/10/19 0309 04/10/19 0340 04/10/19 0518 04/10/19 0953 04/10/19 1130 04/10/19 2030  HGB 8.2* 8.8*  --  9.9* 8.5* 8.6*  --   --   HCT 28.3* 29.0*  --  29.0* 28.1* 27.7*  --   --   PLT 288 333  --   --  300 292  --   --   LABPROT  --   --  18.5*  --   --   --   --   --   INR  --   --  1.6*  --   --   --   --   --   HEPARINUNFRC  --   --   --   --   --   --  0.12* <0.10*  CREATININE 4.10* 5.54*  --   --  5.70*  --   --   --     Estimated Creatinine Clearance: 20 mL/min (A) (by C-G formula based on SCr of 5.7 mg/dL (H)).   Assessment: [id number] male w/ complicated hospital stay decompensated overnight requiring intubation and tx to ICU, concern for PE and awaiting imaging when more stable, to begin heparin.  Follow up heparin level undetectable after rate adjustment this morning.  No bleeding or IV issues noted per RN.  Goal of Therapy:  Heparin level 0.3-0.7 units/ml Monitor platelets by anticoagulation protocol: Yes   Plan:  Repeat heparin 4000 units iv bolus x 1 Increase rate to 2200 8 hour heparin level  Thank you, Erin Hearing PharmD., BCPS Clinical Pharmacist 04/10/2019 9:08 PM

## 2019-04-10 NOTE — Progress Notes (Signed)
PCCM interval note  Subjective/interval events: Started on heparin infusion as per Dr. Earlie Raveling note overnight Left IJ CVC placed Head CT early morning 9/21 without any acute findings to explain altered mental status Phenylephrine being weaned, currently at 42 and planning for hemodialysis today  Vitals:   04/10/19 0645 04/10/19 0700 04/10/19 0800 04/10/19 0824  BP: 108/69 108/70  111/73  Pulse: 69 69  68  Resp: 16 16  16   Temp:   98.5 F (36.9 C)   TempSrc:   Oral   SpO2: 100% 100%  100%  Weight:      Height:      Ill-appearing obese man, ventilated Wakes to voice, tracks, pupils equal and react, did follow commands.  Question impaired sensation right lower extremity, foot Coarse bilateral breath sounds Heart regular, borderline tachycardic, no murmur Abdomen obese, soft, nondistended with positive bowel sounds Left AKA with dressed left thigh wound.  Right lower extremity wound dressed with some serous drainage.  Not undressed to examine this morning  CBC Latest Ref Rng & Units 04/10/2019 04/10/2019 04/10/2019  WBC 4.0 - 10.5 K/uL 33.0(H) - 35.1(H)  Hemoglobin 13.0 - 17.0 g/dL 8.5(L) 9.9(L) 8.8(L)  Hematocrit 39.0 - 52.0 % 28.1(L) 29.0(L) 29.0(L)  Platelets 150 - 400 K/uL 300 - 333   BMP Latest Ref Rng & Units 04/10/2019 04/10/2019 04/10/2019  Glucose 70 - 99 mg/dL 152(H) - 130(H)  BUN 6 - 20 mg/dL 39(H) - 38(H)  Creatinine 0.61 - 1.24 mg/dL 5.70(H) - 5.54(H)  Sodium 135 - 145 mmol/L 135 137 130(L)  Potassium 3.5 - 5.1 mmol/L 4.4 4.5 4.3  Chloride 98 - 111 mmol/L 103 - 95(L)  CO2 22 - 32 mmol/L 20(L) - 23  Calcium 8.9 - 10.3 mg/dL 8.3(L) - 8.2(L)    Shock and overall decompensation 9/21 early morning.  Etiology unclear but with associated fever suspect septic shock.  Pulmonary embolism also considered -Vancomycin and Zosyn initiated, continue pending any culture data. -Will need to review plans regarding right lower extremity wounds with orthopedics, has been evaluated and  followed peripherally by Dr. Sharol Given -Continue heparin infusion for now.  Should be able to go for CT-PA as he stabilizes hemodynamically  Acute respiratory failure in the setting of shock.  Question some associated evolving left lower lobe infiltrate versus vascular congestion -Continue current PRVC, has been weaned to 0.40, PEEP 5 -Assess for SBT once stable hemodynamically  Acute renal failure. -Nephrology planning for intermittent HD 9/21 even on low-dose phenylephrine.  Hopefully will tolerate, may have to consider transition to CVVHD -Was planning for dialysis access with vascular surgery, currently on hold given his acute decompensation  Independent CC time 32 minutes  Baltazar Apo, MD, PhD 04/10/2019, 9:16 AM [facility] [phone] or if no answer [phone]

## 2019-04-10 NOTE — Progress Notes (Signed)
Patient had been scheduled for elective second stage basilic vein transposition.  Events noted with altered mental status and intubation.  Will obviously cancel elective surgery for today.  Please reconsult when patient stable for reconsideration

## 2019-04-10 NOTE — Anesthesia Procedure Notes (Addendum)
Procedure Name: Intubation Date/Time: 04/10/2019 12:36 AM Performed by: Jearld Pies, CRNA Pre-anesthesia Checklist: Patient identified, Emergency Drugs available, Suction available and Patient being monitored Patient Re-evaluated:Patient Re-evaluated prior to induction Oxygen Delivery Method: Ambu bag Preoxygenation: Pre-oxygenation with 100% oxygen Laryngoscope Size: Mac and 4 (Copious frothy secretions noted on DL) Grade View: Grade II Tube type: Oral Tube size: 7.5 mm Number of attempts: 1 Airway Equipment and Method: Stylet Placement Confirmation: ETT inserted through vocal cords under direct vision,  breath sounds checked- equal and bilateral and CO2 detector Secured at: 24 cm Tube secured with: Tape Dental Injury: Teeth and Oropharynx as per pre-operative assessment

## 2019-04-10 NOTE — TOC Benefit Eligibility Note (Signed)
Transition of Care [facility]) Benefit Eligibility Note    Patient Details  Name: Michael Riggs MRN: [id number] Date of Birth: 1971/04/19   Medication/Dose: Enoxaparin  Covered?: Yes     Spoke with Person/Company/Phone Number:: [employer]  Co-Pay: $3.60  Prior Approval: No   Additional Notes: Name brand NOT covered, but generic is at co-pay listed   Delorse Lek Phone Number: 04/10/2019, 11:12 AM

## 2019-04-10 NOTE — Progress Notes (Addendum)
Pharmacy Antibiotic Note  Michael Riggs is a 48 y.o. male admitted [facility]/28/2019 from [facility] with fever, extremity pain and wound infection.  Now with concern for sepsis s/p code blue.  Pharmacy has been consulted for vanc and zosyn dosing.  ESRD - last HD 9/19, planned MWF  Plan: Vancomycin 2000mg  IV x1, then 1000mg  IV qHD Zosyn 3.375g IV every 12 hours Monitor HD schedule, Cx and clinical progression to narrow Vancomycin levels as needed  Height: 5\' 10"  (177.8 cm) Weight: 249 lb 1.9 oz (113 kg) IBW/kg (Calculated) : 73  Temp (24hrs), Avg:101 F (38.3 C), Min:99 F (37.2 C), Max:103.2 F (39.6 C)  Recent Labs  Lab 04/06/19 0404 04/07/19 0402 04/08/19 0932 04/09/19 0802 04/10/19 0007  WBC 16.8* 13.6* 13.0* 14.5* 35.1*  CREATININE 5.19* 4.28* 4.02* 4.10* 5.54*  LATICACIDVEN  --   --   --   --  1.6    Estimated Creatinine Clearance: 20.8 mL/min (A) (by C-G formula based on SCr of 5.54 mg/dL (H)).    No Known Allergies  Bertis Ruddy, PharmD Clinical Pharmacist Please check AMION for all [facility] numbers 04/10/2019 1:28 AM

## 2019-04-10 NOTE — Procedures (Signed)
Central Venous Catheter Insertion Procedure Note Gareth Porteous [id number] 1971-02-27  Procedure: Insertion of Central Venous Catheter Indications: Drug and/or fluid administration  Procedure Details Consent: Risks of procedure as well as the alternatives and risks of each were explained to the (patient/caregiver).  Consent for procedure obtained.from brother. Time Out: Verified patient identification, verified procedure, site/side was marked, verified correct patient position, special equipment/implants available, medications/allergies/relevent history reviewed, required imaging and test results available.  Performed using US guidance  Maximum sterile technique was used including antiseptics, cap, gloves, gown, hand hygiene, mask and sheet. Skin prep: Chlorhexidine; local anesthetic administered A antimicrobial bonded/coated triple lumen catheter was placed in the left internal jugular vein using the Seldinger technique.  Evaluation Blood flow good Complications: No apparent complications Patient did tolerate procedure well. Chest X-ray ordered to verify placement.  CXR: pending.  Shellia Cleverly 04/10/2019, 3:05 AM

## 2019-04-10 NOTE — Progress Notes (Signed)
ANTICOAGULATION CONSULT NOTE - Initial Consult  Pharmacy Consult for heparin Indication: r/o PE  No Known Allergies  Patient Measurements: Height: 5' 10" (177.8 cm) Weight: 245 lb 13 oz (111.5 kg) IBW/kg (Calculated) : 73 Heparin Dosing Weight: 100kg  Vital Signs: Temp: 103.2 F (39.6 C) (09/21 0105) Temp Source: Axillary (09/21 0105) BP: 172/145 (09/20 2020) Pulse Rate: 129 (09/20 2020)  Labs: Recent Labs    04/08/19 0932 04/09/19 0802 04/10/19 0007  HGB 7.2* 8.2* 8.8*  HCT 24.4* 28.3* 29.0*  PLT 291 288 333  CREATININE 4.02* 4.10* 5.54*    Estimated Creatinine Clearance: 20.6 mL/min (A) (by C-G formula based on SCr of 5.54 mg/dL (H)).   Medical History: Past Medical History:  Diagnosis Date  . Anemia   . Depression   . Diabetes mellitus with peripheral vascular disease ([place])   . Dyslipidemia   . Dyspnea   . ESRD (end stage renal disease) on dialysis ([place]) 01/2019   MWF  . GERD (gastroesophageal reflux disease)   . Hypertension   . Physical deconditioning   . Syphilis 01/2019    Medications:  Medications Prior to Admission  Medication Sig Dispense Refill Last Dose  . acetaminophen (TYLENOL) 325 MG tablet Take 2 tablets (650 mg total) by mouth every 6 (six) hours as needed for mild pain (or Fever >/= 101).   unknown at prn  . albuterol (VENTOLIN HFA) 108 (90 Base) MCG/ACT inhaler Inhale 2 puffs into the lungs every 6 (six) hours as needed for wheezing or shortness of breath.   unknown at prn  . atorvastatin (LIPITOR) 40 MG tablet Take 1 tablet (40 mg total) by mouth daily.   03/27/2019 at Unknown time  . bethanechol (URECHOLINE) 25 MG tablet Take 1 tablet (25 mg total) by mouth 3 (three) times daily.   03/27/2019 at Unknown time  . bisacodyl (DULCOLAX) 5 MG EC tablet Take 10 mg by mouth daily.   03/27/2019 at Unknown time  . citalopram (CELEXA) 40 MG tablet Take 40 mg by mouth daily.   0 03/27/2019 at Unknown time  . furosemide (LASIX) 80 MG tablet Take 1 tablet  (80 mg total) by mouth daily. 30 tablet  03/27/2019 at Unknown time  . gabapentin (NEURONTIN) 300 MG capsule Take 1 capsule (300 mg total) by mouth at bedtime. (Patient taking differently: Take 200 mg by mouth 3 (three) times daily. )   03/27/2019 at Unknown time  . Lidocaine-Prilocaine &Lido HCl 2.5-2.5 & 3.88 % KIT Apply 1 application topically every Monday, Wednesday, and Friday. Apply to fistula and wrap with saran wrap site prior to dialysys   03/27/2019 at Unknown time  . Melatonin 3 MG TABS Take 1.5 tablets (4.5 mg total) by mouth at bedtime.  0 03/27/2019 at Unknown time  . methocarbamol (ROBAXIN) 500 MG tablet Take 1 tablet (500 mg total) by mouth every 8 (eight) hours as needed for muscle spasms.   03/27/2019 at prn  . oxyCODONE (OXY IR/ROXICODONE) 5 MG immediate release tablet Take 5 mg by mouth 3 (three) times daily. Take an additional 5 mg every 8 hours as needed.   03/27/2019 at Unknown time  . pantoprazole (PROTONIX) 40 MG tablet Take 1 tablet (40 mg total) by mouth daily.   03/27/2019 at Unknown time  . polyethylene glycol (MIRALAX / GLYCOLAX) 17 g packet Take 17 g by mouth daily. (Patient taking differently: Take 17 g by mouth daily as needed for mild constipation or moderate constipation. 4-8 ounces) 14 each 0 unknown  at prn  . tamsulosin (FLOMAX) 0.4 MG CAPS capsule Take 1 capsule (0.4 mg total) by mouth daily after breakfast. 30 capsule  03/27/2019 at Unknown time   Scheduled:  . atorvastatin  40 mg Oral Q2000  . bethanechol  25 mg Oral TID  . chlorhexidine  60 mL Topical Once  . chlorhexidine gluconate (MEDLINE KIT)  15 mL Mouth Rinse BID  . Chlorhexidine Gluconate Cloth  6 each Topical Q0600  . Chlorhexidine Gluconate Cloth  6 each Topical Q0600  . citalopram  40 mg Oral Daily  . darbepoetin (ARANESP) injection - DIALYSIS  150 mcg Intravenous Q Wed-HD  . feeding supplement (NEPRO CARB STEADY)  237 mL Oral Q24H  . feeding supplement (PRO-STAT SUGAR FREE 64)  30 mL Oral BID  . fentaNYL  1  patch Transdermal Q72H  . ferric citrate  420 mg Oral TID WC  . gabapentin  300 mg Oral QHS  . mouth rinse  15 mL Mouth Rinse 10 times per day  . Melatonin  4.5 mg Oral QHS  . multivitamin  1 tablet Oral QHS  . pantoprazole  40 mg Oral Daily  . potassium chloride  20 mEq Oral BID  . tamsulosin  0.4 mg Oral QPC breakfast   Infusions:  . sodium chloride 125 mL/hr at 04/10/19 0228  . famotidine (PEPCID) IV 20 mg (04/10/19 0225)  . phenylephrine (NEO-SYNEPHRINE) Adult infusion 50 mcg/min (04/10/19 0216)  . piperacillin-tazobactam 3.375 g (04/10/19 0256)  . piperacillin-tazobactam (ZOSYN)  IV    . sodium thiosulfate infusion for calciphylaxis 25 g (04/07/19 1434)  . vancomycin    . vancomycin      Assessment: 48yo male w/ complicated hospital stay decompensated overnight requiring intubation and tx to ICU, concern for PE and awaiting imaging when more stable, to begin heparin.  Goal of Therapy:  Heparin level 0.3-0.7 units/ml Monitor platelets by anticoagulation protocol: Yes   Plan:  Rec'd SQ heparin at ~10p; will give heparin bolus of 3000 units followed by gtt at 1600 units/hr and monitor heparin levels and CBC.   , PharmD, BCPS  04/10/2019,3:13 AM  

## 2019-04-10 NOTE — Progress Notes (Signed)
Responded to Code Blue.  Michael Riggs nurse from [facility] called his brother and sister-in-law.  Family was given number to [facility] unit and told to call to find out visiting possibilities and Michael Riggs's current state. Family was made aware that Michael Riggs is intubated. Let nurses and medical staff on [facility] know that family had been called and to please page chaplain as needed to follow-up with family and offer support.

## 2019-04-10 NOTE — Progress Notes (Signed)
ANTICOAGULATION CONSULT NOTE - Initial Consult  Pharmacy Consult for heparin Indication: r/o PE  No Known Allergies  Patient Measurements: Height: 5\' 10"  (177.8 cm) Weight: 245 lb 13 oz (111.5 kg) IBW/kg (Calculated) : 73 Heparin Dosing Weight: 100kg  Vital Signs: Temp: 98.5 F (36.9 C) (09/21 0912) Temp Source: Axillary (09/21 0912) BP: 102/67 (09/21 1145) Pulse Rate: 74 (09/21 1214)  Labs: Recent Labs    04/09/19 0802 04/10/19 0007 04/10/19 0309 04/10/19 0340 04/10/19 0518 04/10/19 0953 04/10/19 1130  HGB 8.2* 8.8*  --  9.9* 8.5* 8.6*  --   HCT 28.3* 29.0*  --  29.0* 28.1* 27.7*  --   PLT 288 333  --   --  300 292  --   LABPROT  --   --  18.5*  --   --   --   --   INR  --   --  1.6*  --   --   --   --   HEPARINUNFRC  --   --   --   --   --   --  0.12*  CREATININE 4.10* 5.54*  --   --  5.70*  --   --     Estimated Creatinine Clearance: 20 mL/min (A) (by C-G formula based on SCr of 5.7 mg/dL (H)).   Assessment: [id number] male w/ complicated hospital stay decompensated overnight requiring intubation and tx to ICU, concern for PE and awaiting imaging when more stable, to begin heparin.  Initial heparin level low at 0.12  Goal of Therapy:  Heparin level 0.3-0.7 units/ml Monitor platelets by anticoagulation protocol: Yes   Plan:  Repeat heparin 3000 units iv bolus x 1 Drip to 1800 units / hr 8 hour heparin level  Thank you Anette Guarneri, PharmD [phone] 04/10/2019,12:38 PM

## 2019-04-10 NOTE — Code Documentation (Signed)
  Patient Name: Michael Riggs   MRN: [id number]   Date of Birth/ Sex: 1971/04/07 , male      Admission Date: 03/28/2019  Attending Provider: Damita Lack, MD  Primary Diagnosis: Wound infection   Indication: Pt was in his usual state of health until this AM, when he was noted to be unresponsive. Code blue was subsequently called. At the time of arrival on scene, ACLS protocol was underway.   Technical Description:  - CPR performance duration:  6 minutes  - Was defibrillation or cardioversion used? No   - Was external pacer placed? No  - Was patient intubated pre/post CPR? Yes   Medications Administered: Y = Yes; Blank = No Amiodarone    Atropine    Calcium    Epinephrine    Lidocaine    Magnesium    Norepinephrine    Phenylephrine    Sodium bicarbonate    Vasopressin     Post CPR evaluation:  - Final Status - Was patient successfully resuscitated ? Yes - What is current rhythm? NSR - What is current hemodynamic status? Stable  Miscellaneous Information:  - Labs sent, including: ABG (BMP, CBC, Mag already pending)  - Primary team notified?  Yes  - Family Notified? No  - Additional notes/ transfer status: None     Ina Homes, MD  04/10/2019, 1:11 AM

## 2019-04-10 NOTE — Progress Notes (Signed)
PT Cancellation Note  Patient Details Name: Michael Riggs MRN: [id number] DOB: Nov 16, 1970   Cancelled Treatment:    Reason Eval/Treat Not Completed: Patient not medically ready.  Intubated, AMS.  RN asks to hold today. 04/10/2019  [name], PT Acute Rehabilitation Services [phone]  (pager) [phone]  (office)   [name] 04/10/2019, 11:32 AM

## 2019-04-11 ENCOUNTER — Inpatient Hospital Stay (HOSPITAL_COMMUNITY): Payer: Medicare Other

## 2019-04-11 DIAGNOSIS — R0902 Hypoxemia: Secondary | ICD-10-CM

## 2019-04-11 DIAGNOSIS — I469 Cardiac arrest, cause unspecified: Secondary | ICD-10-CM

## 2019-04-11 LAB — HEPATIC FUNCTION PANEL
ALT: 15 U/L (ref 0–44)
AST: 22 U/L (ref 15–41)
Albumin: 1.6 g/dL — ABNORMAL LOW (ref 3.5–5.0)
Alkaline Phosphatase: 84 U/L (ref 38–126)
Bilirubin, Direct: 0.2 mg/dL (ref 0.0–0.2)
Indirect Bilirubin: 0.2 mg/dL — ABNORMAL LOW (ref 0.3–0.9)
Total Bilirubin: 0.4 mg/dL (ref 0.3–1.2)
Total Protein: 7.3 g/dL (ref 6.5–8.1)

## 2019-04-11 LAB — TYPE AND SCREEN
ABO/RH(D): O POS
Antibody Screen: NEGATIVE
Unit division: 0
Unit division: 0

## 2019-04-11 LAB — RENAL FUNCTION PANEL
Albumin: 1.5 g/dL — ABNORMAL LOW (ref 3.5–5.0)
Anion gap: 8 (ref 5–15)
BUN: 28 mg/dL — ABNORMAL HIGH (ref 6–20)
CO2: 24 mmol/L (ref 22–32)
Calcium: 7.7 mg/dL — ABNORMAL LOW (ref 8.9–10.3)
Chloride: 102 mmol/L (ref 98–111)
Creatinine, Ser: 4.54 mg/dL — ABNORMAL HIGH (ref 0.61–1.24)
GFR calc Af Amer: 17 mL/min — ABNORMAL LOW (ref >60)
GFR calc non Af Amer: 14 mL/min — ABNORMAL LOW (ref >60)
Glucose, Bld: 95 mg/dL (ref 70–99)
Phosphorus: 3.5 mg/dL (ref 2.5–4.6)
Potassium: 3.4 mmol/L — ABNORMAL LOW (ref 3.5–5.1)
Sodium: 134 mmol/L — ABNORMAL LOW (ref 135–145)

## 2019-04-11 LAB — BPAM RBC
Blood Product Expiration Date: 202010222359
Blood Product Expiration Date: 202010242359
ISSUE DATE / TIME: 202009191431
Unit Type and Rh: 5100
Unit Type and Rh: 5100

## 2019-04-11 LAB — BLOOD CULTURE ID PANEL (REFLEXED)

## 2019-04-11 LAB — CBC
HCT: 25.9 % — ABNORMAL LOW (ref 39.0–52.0)
Hemoglobin: 7.8 g/dL — ABNORMAL LOW (ref 13.0–17.0)
MCH: 28.1 pg (ref 26.0–34.0)
MCHC: 30.1 g/dL (ref 30.0–36.0)
MCV: 93.2 fL (ref 80.0–100.0)
Platelets: 279 10*3/uL (ref 150–400)
RBC: 2.78 MIL/uL — ABNORMAL LOW (ref 4.22–5.81)
RDW: 16.5 % — ABNORMAL HIGH (ref 11.5–15.5)
WBC: 26.3 10*3/uL — ABNORMAL HIGH (ref 4.0–10.5)
nRBC: 0 % (ref 0.0–0.2)

## 2019-04-11 LAB — ECHOCARDIOGRAM COMPLETE
Height: 70 in
Weight: 3933.01 oz

## 2019-04-11 LAB — GLUCOSE, CAPILLARY
Glucose-Capillary: 106 mg/dL — ABNORMAL HIGH (ref 70–99)
Glucose-Capillary: 69 mg/dL — ABNORMAL LOW (ref 70–99)
Glucose-Capillary: 78 mg/dL (ref 70–99)

## 2019-04-11 LAB — PROTIME-INR
INR: 1.5 — ABNORMAL HIGH (ref 0.8–1.2)
Prothrombin Time: 18.3 seconds — ABNORMAL HIGH (ref 11.4–15.2)

## 2019-04-11 LAB — HEPARIN LEVEL (UNFRACTIONATED)
Heparin Unfractionated: 0.14 IU/mL — ABNORMAL LOW (ref 0.30–0.70)
Heparin Unfractionated: 0.23 IU/mL — ABNORMAL LOW (ref 0.30–0.70)

## 2019-04-11 LAB — MAGNESIUM: Magnesium: 1.8 mg/dL (ref 1.7–2.4)

## 2019-04-11 MED ORDER — PIPERACILLIN-TAZOBACTAM 3.375 G IVPB
3.3750 g | Freq: Two times a day (BID) | INTRAVENOUS | Status: DC
  Administered 2019-04-11 – 2019-04-13 (×4): 3.375 g via INTRAVENOUS
  Filled 2019-04-11 (×6): qty 50

## 2019-04-11 MED ORDER — DEXTROSE 50 % IV SOLN
INTRAVENOUS | Status: AC
  Administered 2019-04-11: 25 mL
  Filled 2019-04-11: qty 50

## 2019-04-11 MED ORDER — PERFLUTREN LIPID MICROSPHERE
1.0000 mL | INTRAVENOUS | Status: AC | PRN
  Administered 2019-04-11: 2 mL via INTRAVENOUS
  Filled 2019-04-11: qty 10

## 2019-04-11 MED ORDER — POTASSIUM CHLORIDE 20 MEQ/15ML (10%) PO SOLN
20.0000 meq | Freq: Once | ORAL | Status: AC
  Administered 2019-04-11: 20 meq
  Filled 2019-04-11: qty 15

## 2019-04-11 MED ORDER — ORAL CARE MOUTH RINSE
15.0000 mL | Freq: Two times a day (BID) | OROMUCOSAL | Status: DC
  Administered 2019-04-12 – 2019-04-27 (×27): 15 mL via OROMUCOSAL

## 2019-04-11 MED ORDER — INFLUENZA VAC SPLIT QUAD 0.5 ML IM SUSY
0.5000 mL | PREFILLED_SYRINGE | INTRAMUSCULAR | Status: AC
  Administered 2019-04-12: 0.5 mL via INTRAMUSCULAR
  Filled 2019-04-11: qty 0.5

## 2019-04-11 MED ORDER — CHLORHEXIDINE GLUCONATE CLOTH 2 % EX PADS
6.0000 | MEDICATED_PAD | Freq: Every day | CUTANEOUS | Status: DC
  Administered 2019-04-11 – 2019-04-22 (×9): 6 via TOPICAL

## 2019-04-11 NOTE — Progress Notes (Addendum)
Pharmacy Antibiotic Note  Michael Riggs is a 48 y.o. male with a complicated hospital admission. Pt is s/p PEA arrest on 9/21, presumed etiology of sepsis or PE. Empirically started on vancomycin and Zosyn.  Patient has ESRD on MWF HD.  Tmax 100, WBC improving.  Plan: Increase Zosyn to 3.375gm EID IV Q12H Continue vancomycin 1gm IV qHD MWF Monitor HD schedule/tolerane, clinical progress, vanc level as indicated  Height: 5\' 10"  (177.8 cm) Weight: 245 lb 13 oz (111.5 kg) IBW/kg (Calculated) : 73  Temp (24hrs), Avg:99.2 F (37.3 C), Min:98.2 F (36.8 C), Max:100 F (37.8 C)  Recent Labs  Lab 04/08/19 0932 04/09/19 0802 04/10/19 0007 04/10/19 0309 04/10/19 0518 04/10/19 0953 04/11/19 0500  WBC 13.0* 14.5* 35.1*  --  33.0* 29.9* 26.3*  CREATININE 4.02* 4.10* 5.54*  --  5.70*  --  4.54*  LATICACIDVEN  --   --  1.6 1.7  --   --   --     Estimated Creatinine Clearance: 25.2 mL/min (A) (by C-G formula based on SCr of 4.54 mg/dL (H)).    No Known Allergies  Zosyn 9/21 >> Vancomycin 9/21 >>  9/8 BCx: ngtd 9/21 Bcx x2: ngx12 hours  Thank you for allowing pharmacy to be a part of this patient's care.  Jonathon Bellows, PharmD Candidate 04/11/2019 10:11 AM   Michael Riggs, PharmD, BCPS, [place] 04/11/2019, 1:53 PM

## 2019-04-11 NOTE — TOC Progression Note (Signed)
Transition of Care [facility]) - Progression Note    Patient Details  Name: Rameek Grasser MRN: [id number] Date of Birth: [birth year]  Transition of Care [facility]) CM/SW Contact  Bartholomew Crews, RN Phone Number: [phone] 04/11/2019, 11:49 AM  Clinical Narrative:    CM currently following at a distance. Patient moved to ICU yesterday. Remains on vent, but may extubate later today.    Expected Discharge Plan: [place] Barriers to Discharge: Continued Medical Work up  Expected Discharge Plan and Services Expected Discharge Plan: [place] In-house Referral: Clinical Social Work Discharge Planning Services: NA Post Acute Care Choice: [place] Living arrangements for the past 2 months: [place]                 DME Arranged: N/A DME Agency: NA       HH Arranged: NA HH Agency: NA         Social Determinants of Health (SDOH) Interventions    Readmission Risk Interventions No flowsheet data found.

## 2019-04-11 NOTE — Progress Notes (Signed)
PHARMACY - PHYSICIAN COMMUNICATION CRITICAL VALUE ALERT - BLOOD CULTURE IDENTIFICATION (BCID)  Michael Riggs is an 48 y.o. male who presented to [facility] on 03/28/[facility] from SNF with fever, extremity pain and wound infection.    Assessment:  1/4 coag negative staph, likely contaminant  Name of physician (or Provider) Contacted: Oletta Darter   Current antibiotics: Vanc/Zosyn  Changes to prescribed antibiotics recommended:  None, f/u infectious workup and further Cx data  Results for orders placed or performed during the hospital encounter of 03/28/19  Blood Culture ID Panel (Reflexed) (Collected: 04/10/2019  2:07 AM)  Result Value Ref Range   Enterococcus species NOT DETECTED NOT DETECTED   Listeria monocytogenes NOT DETECTED NOT DETECTED   Staphylococcus species DETECTED (A) NOT DETECTED   Staphylococcus aureus (BCID) NOT DETECTED NOT DETECTED   Methicillin resistance DETECTED (A) NOT DETECTED   Streptococcus species NOT DETECTED NOT DETECTED   Streptococcus agalactiae NOT DETECTED NOT DETECTED   Streptococcus pneumoniae NOT DETECTED NOT DETECTED   Streptococcus pyogenes NOT DETECTED NOT DETECTED   Acinetobacter baumannii NOT DETECTED NOT DETECTED   Enterobacteriaceae species NOT DETECTED NOT DETECTED   Enterobacter cloacae complex NOT DETECTED NOT DETECTED   Escherichia coli NOT DETECTED NOT DETECTED   Klebsiella oxytoca NOT DETECTED NOT DETECTED   Klebsiella pneumoniae NOT DETECTED NOT DETECTED   Proteus species NOT DETECTED NOT DETECTED   Serratia marcescens NOT DETECTED NOT DETECTED   Haemophilus influenzae NOT DETECTED NOT DETECTED   Neisseria meningitidis NOT DETECTED NOT DETECTED   Pseudomonas aeruginosa NOT DETECTED NOT DETECTED   Candida albicans NOT DETECTED NOT DETECTED   Candida glabrata NOT DETECTED NOT DETECTED   Candida krusei NOT DETECTED NOT DETECTED   Candida parapsilosis NOT DETECTED NOT DETECTED   Candida tropicalis NOT DETECTED NOT DETECTED     Bertis Ruddy 04/11/2019  6:18 AM

## 2019-04-11 NOTE — Procedures (Signed)
Extubation Procedure Note  Patient Details:   Name: Elioenai Hinkle DOB: January 24, 1971 MRN: [id number]   Airway Documentation:  Airway 7.5 mm (Active)  Secured at (cm) 25 cm 04/11/19 0742  Measured From Lips 04/11/19 [place] 04/11/19 0742  Secured By [employer] 04/11/19 0742  Tube Holder Repositioned Yes 04/11/19 0742  Cuff Pressure (cm H2O) 26 cm H2O 04/11/19 0742  Site Condition Dry 04/11/19 0742   Vent end date: (not recorded) Vent end time: (not recorded)   Evaluation  O2 sats: stable throughout Complications: No apparent complications Patient did tolerate procedure well. Bilateral Breath Sounds: Clear, Diminished   Yes   Patient had a positive cuff leak prior to extubation. Patient was extubated to 2 LPM nasal cannula. Strong productive cough and able to speak name. No stridor noted. BBS were clear; diminished. Vitals stable. RN at bedside. RT will continue to monitor.    Rayn Enderson M 04/11/2019, 11:52 AM

## 2019-04-11 NOTE — Progress Notes (Signed)
ANTICOAGULATION CONSULT NOTE  Pharmacy Consult:  Heparin Indication:  Rule out  No Known Allergies  Patient Measurements: Height: 5\' 10"  (177.8 cm) Weight: 245 lb 13 oz (111.5 kg) IBW/kg (Calculated) : 73 Heparin Dosing Weight: 100 kg  Vital Signs: Temp: 99 F (37.2 C) (09/22 1200) Temp Source: Oral (09/22 1200) BP: 128/46 (09/22 1400) Pulse Rate: 72 (09/22 1400)  Labs: Recent Labs    04/10/19 0007 04/10/19 0309  04/10/19 0518 04/10/19 0953  04/10/19 2030 04/11/19 0500 04/11/19 1430  HGB 8.8*  --    < > 8.5* 8.6*  --   --  7.8*  --   HCT 29.0*  --    < > 28.1* 27.7*  --   --  25.9*  --   PLT 333  --   --  300 292  --   --  279  --   LABPROT  --  18.5*  --   --   --   --   --  18.3*  --   INR  --  1.6*  --   --   --   --   --  1.5*  --   HEPARINUNFRC  --   --   --   --   --    < > <0.10* 0.14* 0.23*  CREATININE 5.54*  --   --  5.70*  --   --   --  4.54*  --    < > = values in this interval not displayed.    Estimated Creatinine Clearance: 25.2 mL/min (A) (by C-G formula based on SCr of 4.54 mg/dL (H)).   Assessment: 68 YOM with complicated hospital stay decompensated, requiring intubation and transfer to the ICU on 04/10/19.  Pharmacy consulted to dose IV heparin for rule out PE.  Unable to do CTA given renal dysfunction.  Doppler ordered.  Heparin level is sub-therapeutic but trending up.  No issue with infusion nor bleeding per RN.  Goal of Therapy:  Heparin level 0.3-0.7 units/ml Monitor platelets by anticoagulation protocol: Yes   Plan:  Increase heparin gtt to 2700 units/hr Check 8 hr heparin level Daily heparin level and CBC  Michael Riggs, PharmD, BCPS, [place] 04/11/2019, 3:15 PM

## 2019-04-11 NOTE — Progress Notes (Signed)
  Echocardiogram 2D Echocardiogram with definity has been performed.  Darlina Sicilian M 04/11/2019, 11:40 AM

## 2019-04-11 NOTE — Progress Notes (Signed)
Pt PT Cancellation Note  Patient Details Name: Michael Riggs MRN: [id number] DOB: 1971-02-21   Cancelled Treatment:    Reason Eval/Treat Not Completed: Medical issues which prohibited therapy, remains on ventilator. R/O PE, Check back when medically ready.   Claretha Cooper 04/11/2019, 8:23 AM [place] Pager [phone] Office [phone]

## 2019-04-11 NOTE — Progress Notes (Signed)
Hypoglycemic Event  CBG: 69  Treatment: D50 25 mL (12.5 gm)  Symptoms: None  Follow-up CBG: Time: 2148 CBG Result:106   Possible Reasons for Event: Inadequate meal intake  Comments/MD notified:     Michael Riggs

## 2019-04-11 NOTE — Progress Notes (Signed)
ANTICOAGULATION CONSULT NOTE - Follow Up Consult  Pharmacy Consult for heparin Indication: r/o PE  Labs: Recent Labs    04/09/19 0802 04/10/19 0007 04/10/19 0309  04/10/19 0518 04/10/19 0953 04/10/19 1130 04/10/19 2030 04/11/19 0500  HGB 8.2* 8.8*  --    < > 8.5* 8.6*  --   --  7.8*  HCT 28.3* 29.0*  --    < > 28.1* 27.7*  --   --  25.9*  PLT 288 333  --   --  300 292  --   --  279  LABPROT  --   --  18.5*  --   --   --   --   --  18.3*  INR  --   --  1.6*  --   --   --   --   --  1.5*  HEPARINUNFRC  --   --   --   --   --   --  0.12* <0.10* 0.14*  CREATININE 4.10* 5.54*  --   --  5.70*  --   --   --   --    < > = values in this interval not displayed.    Assessment: 48yo male remains subtherapeutic on heparin after rate increases; no gtt issues or signs of bleeding per RN.  Goal of Therapy:  Heparin level 0.3-0.7 units/ml   Plan:  Will increase heparin gtt by 3 units/kg/hr to 2500 units/hr and check level in 8 hours.    Wynona Neat, PharmD, BCPS  04/11/2019,6:25 AM

## 2019-04-11 NOTE — Progress Notes (Signed)
NAME:  Michael Riggs, MRN:  [id number], DOB:  [birth year], LOS: 36 ADMISSION DATE:  03/28/2019, CONSULTATION DATE: 04/10/2019 REFERRING MD: [facility] hospitalist, CHIEF COMPLAINT: Fever hypotension arrest  Brief History   Patient is a 48 year old male with a complex recent medical history essentially admitted 3 months ago almost continuous stay in the hospital with complications related to renal failure initiation of dialysis.  History of present illness   Patient is a 48 year old male admitted in late July end-stage renal disease and started on dialysis at that time.  Time in early August he developed some blistering on his right leg.  Over the last 2 months the right lower extremity has become more necrotic felt secondary to a combination of chronic infection, diabetes mellitus, peripheral vascular disease.  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  He was discharged to rehab for further physical therapy with severe deconditioning and intermittent confusion felt secondary to uremia sometime in late August.  He was readmitted earlier this month with worsening in the right lower extremity wound infection.  Earlier this evening he became progressively delirious, spiked a temperature to 103 with sudden hypotension and complete unresponsiveness.  He was resuscitated intubated moved to the ICU where I am seeing him.  At the time of my evaluation the patient is on phenylephrine with a systolic blood pressure of 80.  He only has peripheral IV access at this time.  The etiology of his fever and acute decompensation is not entirely clear.  Due to worsening hypotension will be unable to get a CT scan of the chest to rule out the possibility of pulmonary embolus.  We are in the process of culturing him and resuming vancomycin and Zosyn empirically.  We will also start full dose heparinization for possible pulmonary embolus.  If more stable in the morning we can confirm her disavow a PE at that time.  If there is clinical  situation will deteriorate through the night.  Past Medical History   Past Medical History:  Diagnosis Date  . Anemia   . Chronic kidney disease    Stage 5  . Depression   . Diabetes mellitus without complication ([place])   . Dyspnea   . GERD (gastroesophageal reflux disease)   . Hypertension    Patient Active Problem List   Diagnosis Date Noted  . Pruritus   . Scrotal pain   . Labile blood pressure   . Scrotal edema   . Status post below-knee amputation of left lower extremity ([place])   . Slow transit constipation   . Leukocytosis   . Sleep disturbance   . Essential hypertension   . Anemia of chronic disease   . Controlled type 2 diabetes mellitus with hyperglycemia, without long-term current use of insulin ([place])   . ESRD on dialysis ([place])   . Urinary retention   . Debility 02/09/2019  . Fluid overload 02/04/2019  . Hyperkalemia 01/30/2019  . ARF (acute renal failure) ([place]) 01/30/2019  . Hypokalemia 01/30/2019  . Hypoglycemia 01/30/2019  . Syphilis 01/30/2019  . Macrocytic anemia 01/30/2019  . End stage renal disease ([place])   . PDR (proliferative diabetic retinopathy) ([place]) 12/14/2013  . Venous hypertension 09/22/2013  . Diabetes mellitus type 2 in obese ([place]) 07/04/2013  . Habitual alcohol use 07/04/2013  . Obesity, Class II, BMI 35-39.9, with comorbidity 07/04/2013     Significant Hospital Events   Resuscitation and admission to the ICU 04/10/2019  Consults:  Vascular Nephrology  Procedures:  NA  Significant  Diagnostic Tests:  NA  Micro Data:  04/08/2002 blood cultures x2>> 04/09/2019 sputum>>  Antimicrobials:  04/10/2019 vancomycin>> 04/09/2019 Zosyn>>  Interim history/subjective:  Awake alert off vasopressor support.  Objective   Blood pressure (!) 115/91, pulse 79, temperature 98.9 F (37.2 C), temperature source Oral, resp. rate 15, height 5\' 10"  (1.778 m), weight 111.5 kg, SpO2 100 %.    Vent Mode: PSV;CPAP FiO2 (%):  [40  %] 40 % Set Rate:  [16 bmp] 16 bmp Vt Set:  [580 mL] 580 mL PEEP:  [5 cmH20] 5 cmH20 Pressure Support:  [8 cmH20] 8 cmH20 Plateau Pressure:  [19 cmH20-28 cmH20] 24 cmH20   Intake/Output Summary (Last 24 hours) at 04/11/2019 [id number] Last data filed at 04/11/2019 0900 Gross per 24 hour  Intake 3750.96 ml  Output 450 ml  Net 3300.96 ml   Filed Weights   04/10/19 0105 04/10/19 0900 04/10/19 1215  Weight: 111.5 kg 111.5 kg 111.5 kg    Examination: General: Morbid obese male who is awake alert on full mechanical ventilatory support HEENT: Orogastric tube in place endotracheal tube in place pupils equal reactive to light Neuro: Tracks follows moves all extremities x3 t1/2 CV: Heart sounds are regular regular rate and rhythm PULM: Creased breath sounds in the bases GI: soft, bsx4 active  Extremities: Left basilic AV graft positive pulse, left BKA is noted.,  Right lower extremity foot mottled dark dressings dry and intact Skin: no rashes or lesions  04/11/2019 chest x-ray reviewed.  Support apparatus was in good position.  Improved aeration. Resolved Hospital Problem list   NA  Assessment & Plan:  Ventilator dependent respiratory failure intubated 0 300 04/10/2019 due to cardiac arrest and inability to protect airway. Wean per protocol Serial chest x-ray Pulmonary toilet 04/11/2019 awake and alert may be asked debatable.   Status post cardiac arrest 04/09/99 with CPR performed for 6 minutes no defibrillation no drugs.  Questionable etiology.  Currently on heparin drip for suspected PE. Not requiring vasopressor support Awake and alert Continue to monitor electrolytes Consider 2D echo to evaluate cardiac function With chronic kidney disease CT PE protocol not recommended Therefore continue heparin for now can consider VQ scan Check lower extremity Doppler studies   Chronic kidney disease with left AV graft maturing, right tunneled catheter in place.  Questionable she will need CRRT  or hemodialysis he does have a low blood pressure at this time. Lab Results  Component Value Date   CREATININE 4.54 (H) 04/11/2019   CREATININE 5.70 (H) 04/10/2019   CREATININE 5.54 (H) 04/10/2019   Recent Labs  Lab 04/10/19 0340 04/10/19 0518 04/11/19 0500  K 4.5 4.4 3.4*  Replete potassium 20 mEq potassium per feeding tube CRRT or hemodialysis per renal Vascular surgery was planning on second stage of AV graft this is on hold currently.  Questionable sepsis currently on empirical antimicrobial therapy Check procalcitonin Panculture 1 of 4 blood cultures most likely contaminant Check sputum culture Wean pressor support as tolerated   Peripheral vascular disease status post left AKA chronic changes to right lower extremity. Continue to monitor     Best practice:  Diet: N.p.o./nutrition consult Pain/Anxiety/Delirium protocol (if indicated): Fentanyl PRN VAP protocol (if indicated): Yes DVT prophylaxis: Full dose heparin GI prophylaxis: Yes Glucose control: CBG Mobility: Bedrest Code Status: Full Family Communication: Discussed with patient's next of kin his brother and his sister-in-law Disposition: ICU  Labs   CBC: Recent Labs  Lab 04/09/19 0802 04/10/19 0007 04/10/19 0340 04/10/19 0518 04/10/19 [id number] 04/11/19  0500  WBC 14.5* 35.1*  --  33.0* 29.9* 26.3*  HGB 8.2* 8.8* 9.9* 8.5* 8.6* 7.8*  HCT 28.3* 29.0* 29.0* 28.1* 27.7* 25.9*  MCV 94.0 92.4  --  92.4 91.1 93.2  PLT 288 333  --  300 292 [id number]    Basic Metabolic Panel: Recent Labs  Lab 04/06/19 0404 04/07/19 0402 04/08/19 0932 04/09/19 0802 04/10/19 0007 04/10/19 0340 04/10/19 0518 04/11/19 0500  NA 136 134* 134* 131* 130* 137 135 134*  K 3.6 3.6 4.2 3.9 4.3 4.5 4.4 3.4*  CL 96* 96* 96* 94* 95*  --  103 102  CO2 20* 25 25 24 23   --  20* 24  GLUCOSE 113* 121* 99 116* 130*  --  152* 95  BUN 30* 20 20 25* 38*  --  39* 28*  CREATININE 5.19* 4.28* 4.02* 4.10* 5.54*  --  5.70* 4.54*  CALCIUM 8.6*  8.6* 8.6* 8.3* 8.2*  --  8.3* 7.7*  MG 1.9 1.9 2.0 1.8 1.7  --   --   --   PHOS  --   --  3.4 3.7 3.4  --  4.4 3.5   GFR: Estimated Creatinine Clearance: 25.2 mL/min (A) (by C-G formula based on SCr of 4.54 mg/dL (H)). Recent Labs  Lab 04/10/19 0007 04/10/19 0309 04/10/19 0518 04/10/19 0953 04/11/19 0500  WBC 35.1*  --  33.0* 29.9* 26.3*  LATICACIDVEN 1.6 1.7  --   --   --     Liver Function Tests: Recent Labs  Lab 04/08/19 0932 04/09/19 0802 04/10/19 0007 04/10/19 0518 04/11/19 0500  ALBUMIN 1.7* 1.9* 1.8* 1.7* 1.5*   No results for input(s): LIPASE, AMYLASE in the last 168 hours. No results for input(s): AMMONIA in the last 168 hours.  ABG    Component Value Date/Time   PHART 7.415 04/10/2019 0340   PCO2ART 37.4 04/10/2019 0340   PO2ART 257.0 (H) 04/10/2019 0340   HCO3 24.0 04/10/2019 0340   TCO2 25 04/10/2019 0340   ACIDBASEDEF 2.8 (H) 04/10/2019 0045   O2SAT 100.0 04/10/2019 0340     Coagulation Profile: Recent Labs  Lab 04/10/19 0309 04/11/19 0500  INR 1.6* 1.5*    Cardiac Enzymes: No results for input(s): CKTOTAL, CKMB, CKMBINDEX, TROPONINI in the last 168 hours.  HbA1C: Hgb A1c MFr Bld  Date/Time Value Ref Range Status  01/31/2019 11:18 AM 5.0 4.8 - 5.6 % Final    Comment:    (NOTE) Pre diabetes:          5.7%-6.4% Diabetes:              >6.4% Glycemic control for   <7.0% adults with diabetes     CBG: Recent Labs  Lab 04/10/19 0055 04/10/19 0356 04/10/19 0753 04/10/19 1155 04/10/19 1548  GLUCAP 127* 127* 133* 106* 147*     Social History   reports that he quit smoking about 2 months ago. His smoking use included cigarettes. He started smoking about 4 months ago. He has never used smokeless tobacco. He reports previous alcohol use. He reports previous drug use.    App cct 36 min      Richardson Landry Arthuro Canelo ACNP Maryanna Shape PCCM Pager [phone] till 1 pm If no answer page [phone] 04/11/2019, 9:25 AM

## 2019-04-11 NOTE — Progress Notes (Addendum)
[facility] NEPHROLOGY PROGRESS NOTE  Assessment/ Plan: Pt is a 48 y.o. yo male with ESRD on HD, hypertension, admitted with sepsis due to right lower extremity cellulitis, calciphylaxis wound.  Patient had cardiac arrest on 9/21 and moved to ICU. HD orders: [facility] MWF 4 hrs 180NRe 400/800 120 kg 2.0 K/ 2.25 Ca (Change to 4.0 K /2.0 Ca bath on DC) -Heparin 4000 units IV TIW -Calcitriol 0.25 mcg PO TIW  #Shock suspect septic shock: Currently on vancomycin and Zosyn pending culture data.  1 bottle is growing coag negative methicillin-resistant staph.  Also receiving heparin infusion.  Patient is off of pressors with acceptable blood pressure reading this morning.  # ESRD: Patient had serial dialysis in the beginning now on MWF schedule.  Status post dialysis yesterday with no net UF.  Tolerated well.  No indication for dialysis today.  We will assess tomorrow morning for intermittent hemodialysis versus CRRT depending on blood pressure.  Discontinue IVF.  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  Continue low calcium bath, avoid VDRA.  Continue sodium thiosulfate.  Wound care and pain management per primary team.  #Acute respiratory failure with hypoxia: Currently on mechanical ventilator.  PCCM is following.  # Anemia: Monitor hemoglobin.  Continue ESA.  Transfuse as needed.  # Secondary hyperparathyroidism: Use low calcium bath, avoid calcium based binders.  Continue Auryxia able to take orally.  #Hypotension/volume: Blood pressure improved, off pressor today.  #Dialysis Access: Patient was scheduled for elective second stage basilic vein transposition on 9/21 which was postponed because of cardiac arrest.  [facility] is following  Subjective: Seen and examined in ICU.  Patient is alert awake and following simple commands.  Remains on mechanical ventilator.  Off sedation.  Objective Vital signs in last 24  hours: Vitals:   04/11/19 0700 04/11/19 0742 04/11/19 0800 04/11/19 0900  BP:   93/60 (!) 115/91  Pulse: 80 79  79  Resp: _0 Temp:  98.9 F (37.2 C)    TempSrc:  Oral    SpO2: 100% 100% 100% 100%  Weight:      Height:       Weight change: 0 kg  Intake/Output Summary (Last 24 hours) at 04/11/2019 0955 Last data filed at 04/11/2019 0900 Gross per 24 hour  Intake 3750.96 ml  Output 450 ml  Net 3300.96 ml       Labs: Basic Metabolic Panel: Recent Labs  Lab 04/10/19 0007 04/10/19 0340 04/10/19 0518 04/11/19 0500  NA 130* 137 135 134*  K 4.3 4.5 4.4 3.4*  CL 95*  --  103 102  CO2 23  --  20* 24  GLUCOSE 130*  --  152* 95  BUN 38*  --  39* 28*  CREATININE 5.54*  --  5.70* 4.54*  CALCIUM 8.2*  --  8.3* 7.7*  PHOS 3.4  --  4.4 3.5   Liver Function Tests: Recent Labs  Lab 04/10/19 0007 04/10/19 0518 04/11/19 0500  ALBUMIN 1.8* 1.7* 1.5*   No results for input(s): LIPASE, AMYLASE in the last 168 hours. No results for input(s): AMMONIA in the last 168 hours. CBC: Recent Labs  Lab 04/09/19 0802 04/10/19 0007  04/10/19 0518 04/10/19 0953 04/11/19 0500  WBC 14.5* 35.1*  --  33.0* 29.9* 26.3*  HGB 8.2* 8.8*   < > 8.5* 8.6* 7.8*  HCT 28.3* 29.0*   < > 28.1* 27.7* 25.9*  MCV 94.0 92.4  --  92.4  91.1 93.2  PLT 288 333  --  300 292 279   < > = values in this interval not displayed.   Cardiac Enzymes: No results for input(s): CKTOTAL, CKMB, CKMBINDEX, TROPONINI in the last 168 hours. CBG: Recent Labs  Lab 04/10/19 0055 04/10/19 0356 04/10/19 0753 04/10/19 1155 04/10/19 1548  GLUCAP 127* 127* 133* 106* 147*    Iron Studies: No results for input(s): IRON, TIBC, TRANSFERRIN, FERRITIN in the last 72 hours. Studies/Results: Ct Head Wo Contrast  Result Date: 04/10/2019 CLINICAL DATA:  Altered mental status with unclear cause EXAM: CT HEAD WITHOUT CONTRAST TECHNIQUE: Contiguous axial images were obtained from the base of the skull through the vertex  without intravenous contrast. COMPARISON:  None. FINDINGS: Brain: No evidence of acute infarction, hemorrhage, hydrocephalus, extra-axial collection or mass lesion/mass effect. 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 Vascular: No hyperdense vessel. End-stage renal disease with extensive vessel calcification in the scalp. Skull: Normal. Negative for fracture or focal lesion. Sinuses/Orbits: No acute finding. IMPRESSION: No acute finding. Electronically Signed   By: Monte Fantasia M.D.   On: 04/10/2019 06:02   Dg Chest Port 1 View  Result Date: 04/10/2019 CLINICAL DATA:  Central line placement EXAM: PORTABLE CHEST 1 VIEW COMPARISON:  04/10/2019, 03/28/2019 FINDINGS: Endotracheal tube tip is about 4.5 cm superior to the carina. Esophageal tube tip is below the diaphragm but non included. Right-sided central venous catheter tip at the cavoatrial region. New left IJ central venous catheter with tip visible medial to the aortic arch, presumably over brachiocephalic region. No left pneumothorax. Cardiomegaly with vascular congestion and hazy perihilar edema. New left lung base consolidation, atelectasis versus pneumonia. IMPRESSION: 1. New left IJ central venous catheter with tip visible to the right of the aortic arch, presumably over brachiocephalic region. No left pneumothorax. 2. Support lines and tubes as above 3. Cardiomegaly with vascular congestion and hazy perihilar edema. New consolidation in the left lung base, favor atelectasis. Electronically Signed   By: Donavan Foil M.D.   On: 04/10/2019 03:18   Dg Chest Port 1 View  Result Date: 04/10/2019 CLINICAL DATA:  Intubation. EXAM: PORTABLE CHEST 1 VIEW COMPARISON:  Radiograph 03/28/2019 FINDINGS: Endotracheal tube tip 4.9 cm from the carina. Enteric tube in place, tip below the diaphragm not included in the field of view. Right-sided dialysis catheter tip in the SVC. Stable cardiomegaly. Unchanged mediastinal contours. Linear opacity in  the right mid lung likely fluid in the fissure. Blunting of the right costophrenic angle. Small peripheral opacity in the left mid lung zone. Overlying artifact limits detailed assessment. Suspect central vascular congestion. No pneumothorax. IMPRESSION: 1. Endotracheal tube tip 4.9 cm from the carina. 2. Enteric tube in place, tip below the diaphragm not included in the field of view. 3. Vascular congestion with right pleural effusion of fluid in the fissures. Cardiomegaly is stable. 4. Peripheral opacity in the left mid lung zone nonspecific. This may be infectious, atelectasis, or pleural fluid in the fissure. Electronically Signed   By: Keith Rake M.D.   On: 04/10/2019 01:36   Dg Abd Portable 1v  Result Date: 04/10/2019 CLINICAL DATA:  NG tube placement. EXAM: PORTABLE ABDOMEN - 1 VIEW COMPARISON:  CT 02/11/2019 FINDINGS: Tip and side port of the enteric tube below the diaphragm in the stomach. No small bowel dilatation in the upper abdomen. Mild gaseous distension of transverse colon which appears similar to prior CT. No evidence of free air. Multiple monitoring devices overlie the lower chest. IMPRESSION: Tip  and side port of the enteric tube below the diaphragm in the stomach. Electronically Signed   By: Keith Rake M.D.   On: 04/10/2019 01:37    Medications: Infusions: . sodium chloride    . sodium chloride    . sodium chloride 125 mL/hr at 04/11/19 0900  . famotidine (PEPCID) IV Stopped (04/10/19 1249)  . heparin 2,500 Units/hr (04/11/19 0900)  . phenylephrine (NEO-SYNEPHRINE) Adult infusion Stopped (04/10/19 1118)  . piperacillin-tazobactam (ZOSYN)  IV    . sodium thiosulfate infusion for calciphylaxis 25 g (04/07/19 1434)  . vancomycin 1,000 mg (04/10/19 1143)    Scheduled Medications: . atorvastatin  40 mg Oral Q2000  . B-complex with vitamin C  1 tablet Per Tube Daily  . bethanechol  25 mg Oral TID  . chlorhexidine  60 mL Topical Once  . chlorhexidine  60 mL Topical  Once   And  . chlorhexidine  60 mL Topical Once  . chlorhexidine gluconate (MEDLINE KIT)  15 mL Mouth Rinse BID  . Chlorhexidine Gluconate Cloth  6 each Topical Q0600  . Chlorhexidine Gluconate Cloth  6 each Topical Q0600  . citalopram  40 mg Oral Daily  . darbepoetin (ARANESP) injection - DIALYSIS  150 mcg Intravenous Q Wed-HD  . fentaNYL  1 patch Transdermal Q72H  . ferric citrate  420 mg Oral TID WC  . gabapentin  300 mg Oral QHS  . mouth rinse  15 mL Mouth Rinse 10 times per day  . Melatonin  4.5 mg Oral QHS  . multivitamin  15 mL Per Tube Daily  . pantoprazole  40 mg Oral Daily  . potassium chloride  20 mEq Per Tube Once  . tamsulosin  0.4 mg Oral QPC breakfast    have reviewed scheduled and prn medications.  Physical Exam: General: Intubated, alert awake and following simple commands Heart:RRR, s1s2 nl Lungs: Coarse breath sound bilateral, no wheezing Abdomen:soft,  non-distended Extremities: Left BKA with dressing applied, no edema. Dialysis Access: Right IJ TDC, left aVF   Kamel Haven Prasad Lilly Gasser 04/11/2019,9:55 AM  LOS: 14 days  Pager: [phone]

## 2019-04-12 ENCOUNTER — Inpatient Hospital Stay (HOSPITAL_COMMUNITY): Payer: Medicare Other

## 2019-04-12 DIAGNOSIS — R509 Fever, unspecified: Secondary | ICD-10-CM

## 2019-04-12 DIAGNOSIS — I82401 Acute embolism and thrombosis of unspecified deep veins of right lower extremity: Secondary | ICD-10-CM

## 2019-04-12 LAB — CBC WITH DIFFERENTIAL/PLATELET
Abs Immature Granulocytes: 0.88 10*3/uL — ABNORMAL HIGH (ref 0.00–0.07)
Basophils Absolute: 0.2 10*3/uL — ABNORMAL HIGH (ref 0.0–0.1)
Basophils Relative: 1 %
Eosinophils Absolute: 0.6 10*3/uL — ABNORMAL HIGH (ref 0.0–0.5)
Eosinophils Relative: 2 %
HCT: 27.1 % — ABNORMAL LOW (ref 39.0–52.0)
Hemoglobin: 7.6 g/dL — ABNORMAL LOW (ref 13.0–17.0)
Immature Granulocytes: 3 %
Lymphocytes Relative: 9 %
Lymphs Abs: 2.4 10*3/uL (ref 0.7–4.0)
MCH: 27.2 pg (ref 26.0–34.0)
MCHC: 28 g/dL — ABNORMAL LOW (ref 30.0–36.0)
MCV: 97.1 fL (ref 80.0–100.0)
Monocytes Absolute: 1.7 10*3/uL — ABNORMAL HIGH (ref 0.1–1.0)
Monocytes Relative: 7 %
Neutro Abs: 20 10*3/uL — ABNORMAL HIGH (ref 1.7–7.7)
Neutrophils Relative %: 78 %
Platelets: 307 10*3/uL (ref 150–400)
RBC: 2.79 MIL/uL — ABNORMAL LOW (ref 4.22–5.81)
RDW: 16.7 % — ABNORMAL HIGH (ref 11.5–15.5)
WBC: 25.2 10*3/uL — ABNORMAL HIGH (ref 4.0–10.5)
nRBC: 0 % (ref 0.0–0.2)

## 2019-04-12 LAB — PROTIME-INR
INR: 1.5 — ABNORMAL HIGH (ref 0.8–1.2)
Prothrombin Time: 17.5 seconds — ABNORMAL HIGH (ref 11.4–15.2)

## 2019-04-12 LAB — CBC
HCT: 26.7 % — ABNORMAL LOW (ref 39.0–52.0)
Hemoglobin: 8.1 g/dL — ABNORMAL LOW (ref 13.0–17.0)
MCH: 28.2 pg (ref 26.0–34.0)
MCHC: 30.3 g/dL (ref 30.0–36.0)
MCV: 93 fL (ref 80.0–100.0)
Platelets: 285 10*3/uL (ref 150–400)
RBC: 2.87 MIL/uL — ABNORMAL LOW (ref 4.22–5.81)
RDW: 16.5 % — ABNORMAL HIGH (ref 11.5–15.5)
WBC: 19.9 10*3/uL — ABNORMAL HIGH (ref 4.0–10.5)
nRBC: 0 % (ref 0.0–0.2)

## 2019-04-12 LAB — GLUCOSE, CAPILLARY
Glucose-Capillary: 78 mg/dL (ref 70–99)
Glucose-Capillary: 61 mg/dL — ABNORMAL LOW (ref 70–99)
Glucose-Capillary: 80 mg/dL (ref 70–99)
Glucose-Capillary: 77 mg/dL (ref 70–99)

## 2019-04-12 LAB — CULTURE, BLOOD (ROUTINE X 2): Special Requests: ADEQUATE

## 2019-04-12 LAB — RENAL FUNCTION PANEL
Albumin: 1.6 g/dL — ABNORMAL LOW (ref 3.5–5.0)
Anion gap: 12 (ref 5–15)
BUN: 36 mg/dL — ABNORMAL HIGH (ref 6–20)
CO2: 22 mmol/L (ref 22–32)
Calcium: 8.1 mg/dL — ABNORMAL LOW (ref 8.9–10.3)
Chloride: 101 mmol/L (ref 98–111)
Creatinine, Ser: 6.02 mg/dL — ABNORMAL HIGH (ref 0.61–1.24)
GFR calc Af Amer: 12 mL/min — ABNORMAL LOW (ref >60)
GFR calc non Af Amer: 10 mL/min — ABNORMAL LOW (ref >60)
Glucose, Bld: 77 mg/dL (ref 70–99)
Phosphorus: 5.3 mg/dL — ABNORMAL HIGH (ref 2.5–4.6)
Potassium: 3.7 mmol/L (ref 3.5–5.1)
Sodium: 135 mmol/L (ref 135–145)

## 2019-04-12 LAB — MAGNESIUM: Magnesium: 1.8 mg/dL (ref 1.7–2.4)

## 2019-04-12 LAB — PROCALCITONIN: Procalcitonin: 31.65 ng/mL

## 2019-04-12 LAB — HEPARIN LEVEL (UNFRACTIONATED)
Heparin Unfractionated: 0.33 IU/mL (ref 0.30–0.70)
Heparin Unfractionated: 0.47 IU/mL (ref 0.30–0.70)

## 2019-04-12 IMAGING — CT CT ANGIO CHEST
2 of 7 series · 17 of 46 positions shown · IV contrast (APPLIED)
Comparison: None.

CLINICAL DATA: Shortness of breath, chest pain

EXAM:
CT ANGIOGRAPHY CHEST WITH CONTRAST
TECHNIQUE: Multidetector CT imaging of the chest was performed using the
standard protocol during bolus administration of intravenous
contrast. Multiplanar CT image reconstructions and MIPs were
obtained to evaluate the vascular anatomy.
CONTRAST:  100mL OMNIPAQUE IOHEXOL 350 MG/ML SOLN

[Series 9: thins · axial · 0.87mm/px · z∈[-136,+133]mm · 14 of 433 slices shown]
[im 25/433  lung]
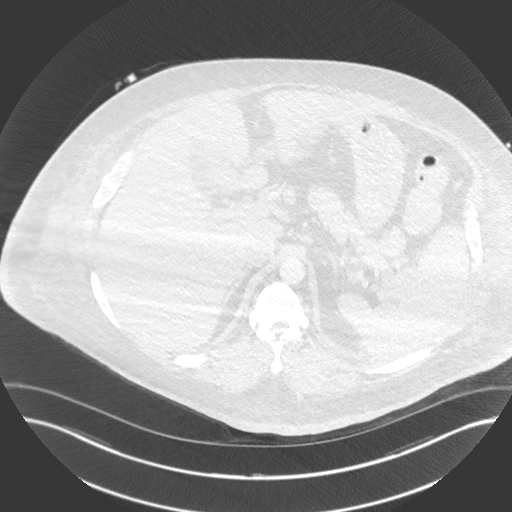
[im 49/433  soft-tissue]
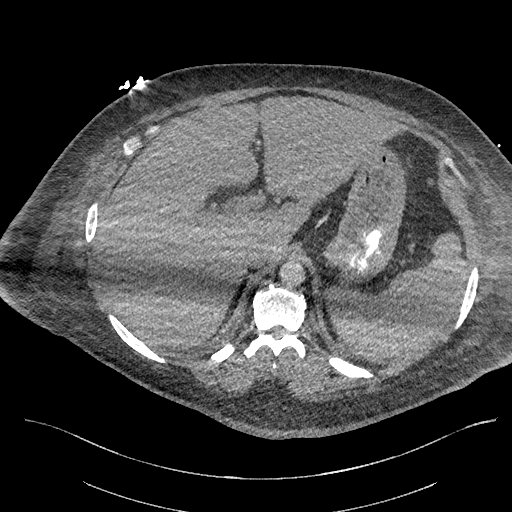
[im 97/433  lung]
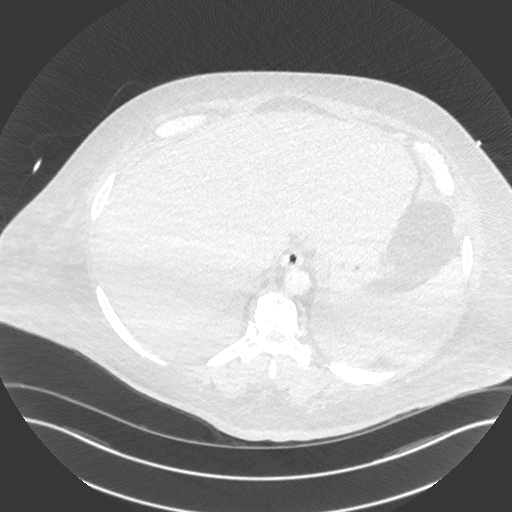
[im 121/433  soft-tissue]
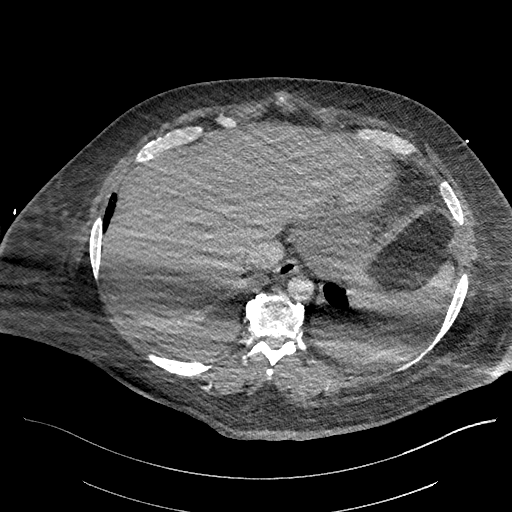
[im 145/433  lung]
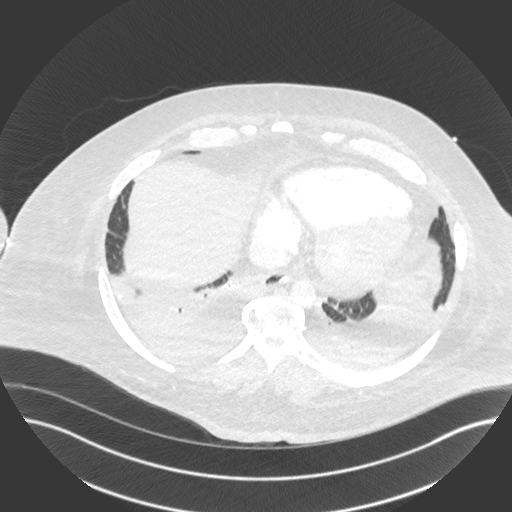
[im 169/433  soft-tissue]
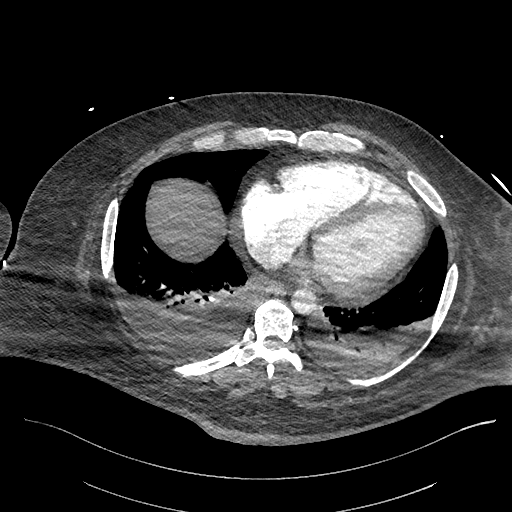
[im 193/433  lung]
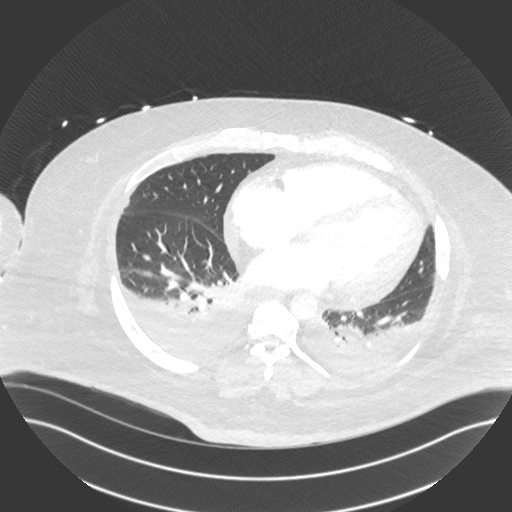
[im 241/433  soft-tissue]
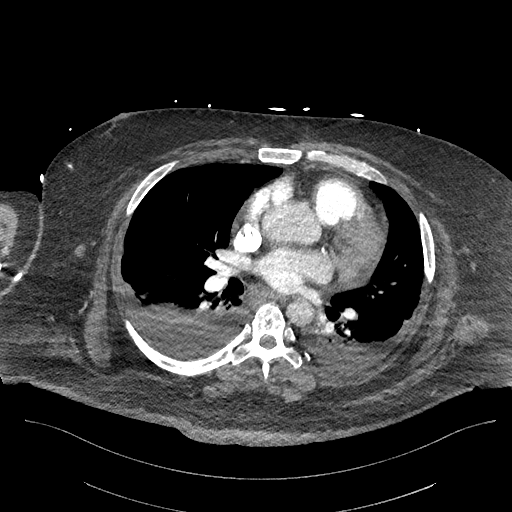
[im 265/433  lung]
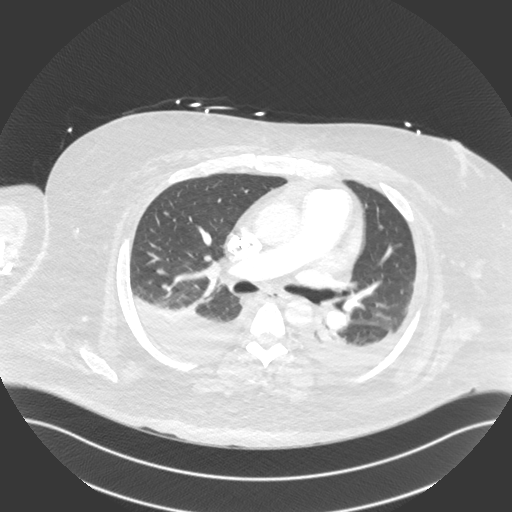
[im 289/433  soft-tissue]
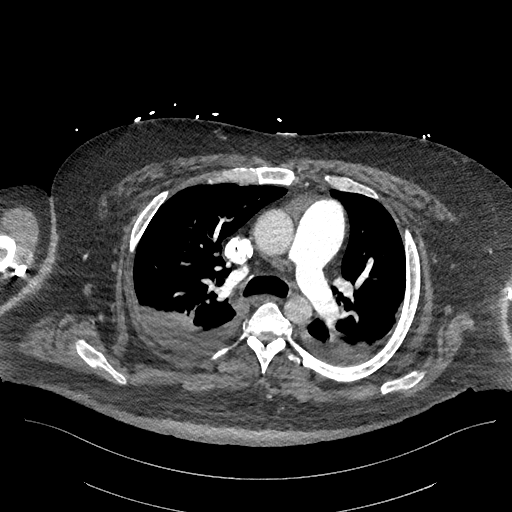
[im 313/433  lung]
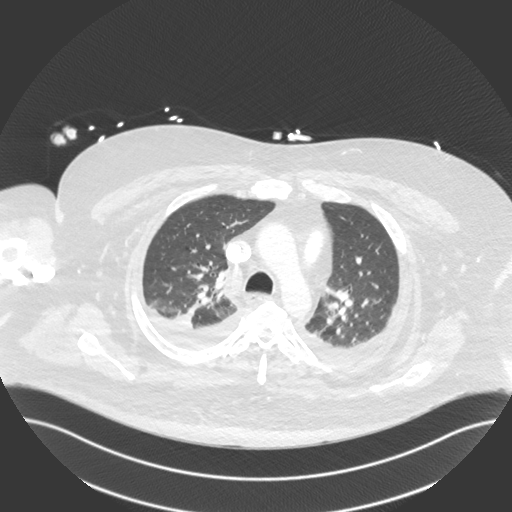
[im 337/433  soft-tissue]
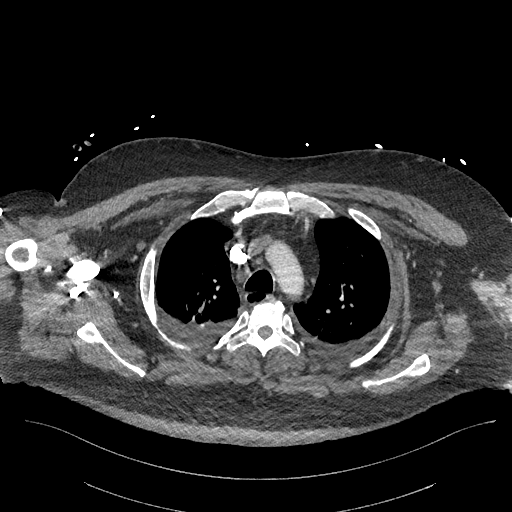
[im 385/433  lung]
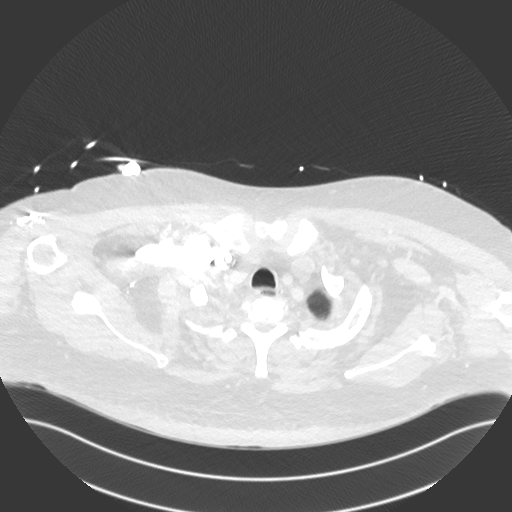
[im 409/433  soft-tissue]
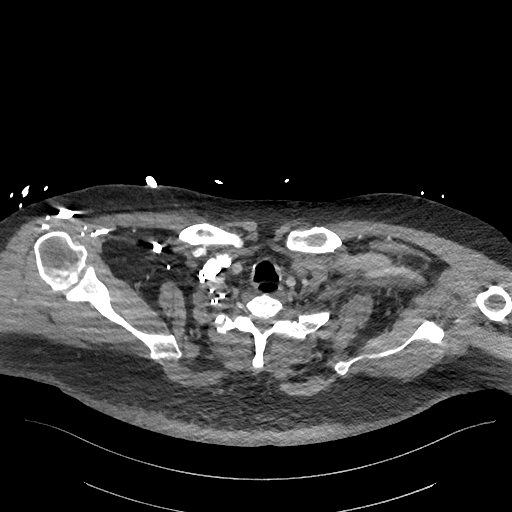

[Series 10: cor · coronal · 0.66mm/px · 3 of 171 slices shown]
[im 43/171  soft-tissue]
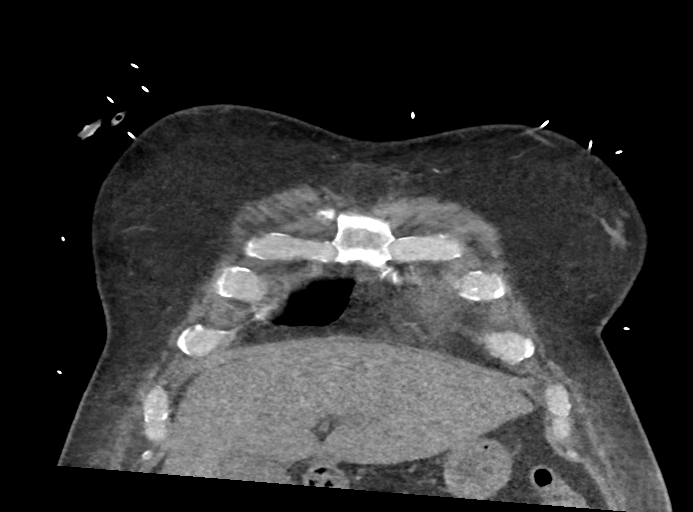
[im 86/171  soft-tissue]
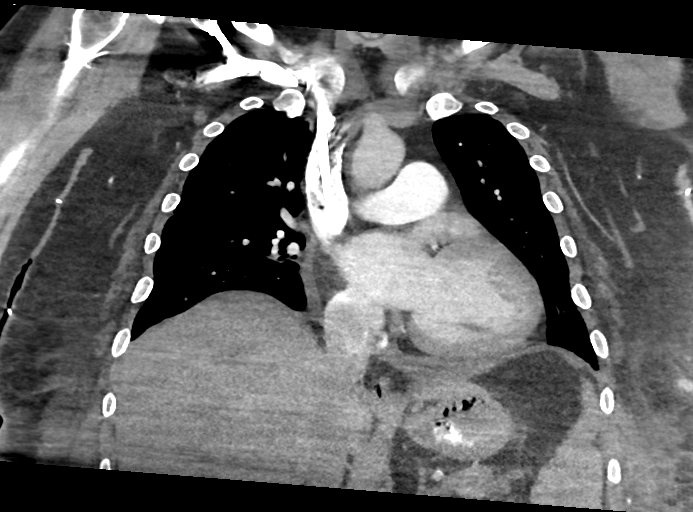
[im 128/171  soft-tissue]
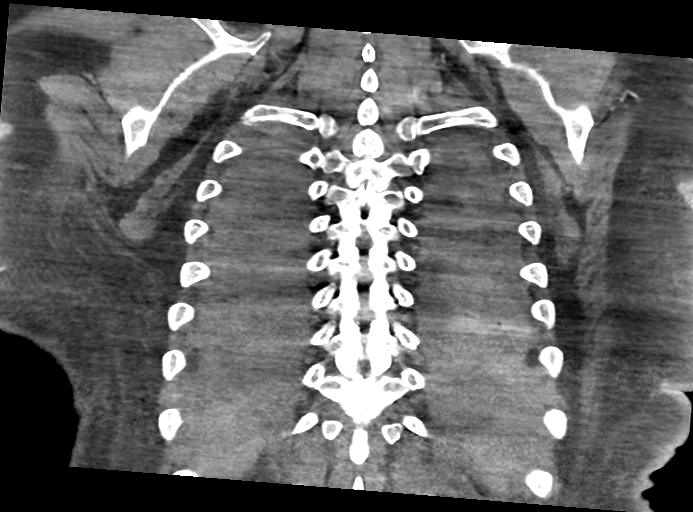

[17 of 46 positions shown; findings below may reference images not displayed]

FINDINGS: Cardiovascular: Hemodialysis catheter extends to the distal SVC.
Heart size upper limits normal. Small pericardial effusion. Dilated
main pulmonary artery. Satisfactory opacification of pulmonary
arteries noted, and there is no evidence of pulmonary emboli. Motion
degradation degrades some of the images. Coronary calcifications.
Adequate contrast opacification of the thoracic aorta with no
evidence of dissection, aneurysm, or stenosis. There is classic
3-vessel brachiocephalic arch anatomy without proximal stenosis.

Mediastinum/Nodes: No hilar or mediastinal adenopathy.

Lungs/Pleura: Small pleural effusions right greater than left.
Dependent atelectasis/consolidation in both lung bases. No overt
interstitial edema.

Upper Abdomen: Multiple small partially calcified stones layer in
the dependent aspect the gallbladder. No acute findings.

Musculoskeletal: No chest wall abnormality. No acute or significant
osseous findings.

Review of the MIP images confirms the above findings.
IMPRESSION: 1. Negative for acute PE or thoracic aortic dissection.
2. Small bilateral pleural effusions right greater than left with
dependent atelectasis/consolidation in both lung bases.
3. Coronary artery disease. Please note that although the presence
of coronary artery calcium documents the presence of coronary artery
disease, the severity of this disease and any potential stenosis
cannot be assessed on this non-gated CT examination. Assessment for
potential risk factor modification, dietary therapy or pharmacologic
therapy may be warranted, if clinically indicated.
4. Cholelithiasis.

## 2019-04-12 IMAGING — DX DG CHEST 1V PORT
1 series · 1 of 1 positions shown · non-contrast
Comparison: Radiograph [DATE].

CLINICAL DATA: Respiratory failure.

EXAM:
PORTABLE CHEST 1 VIEW

[chest]
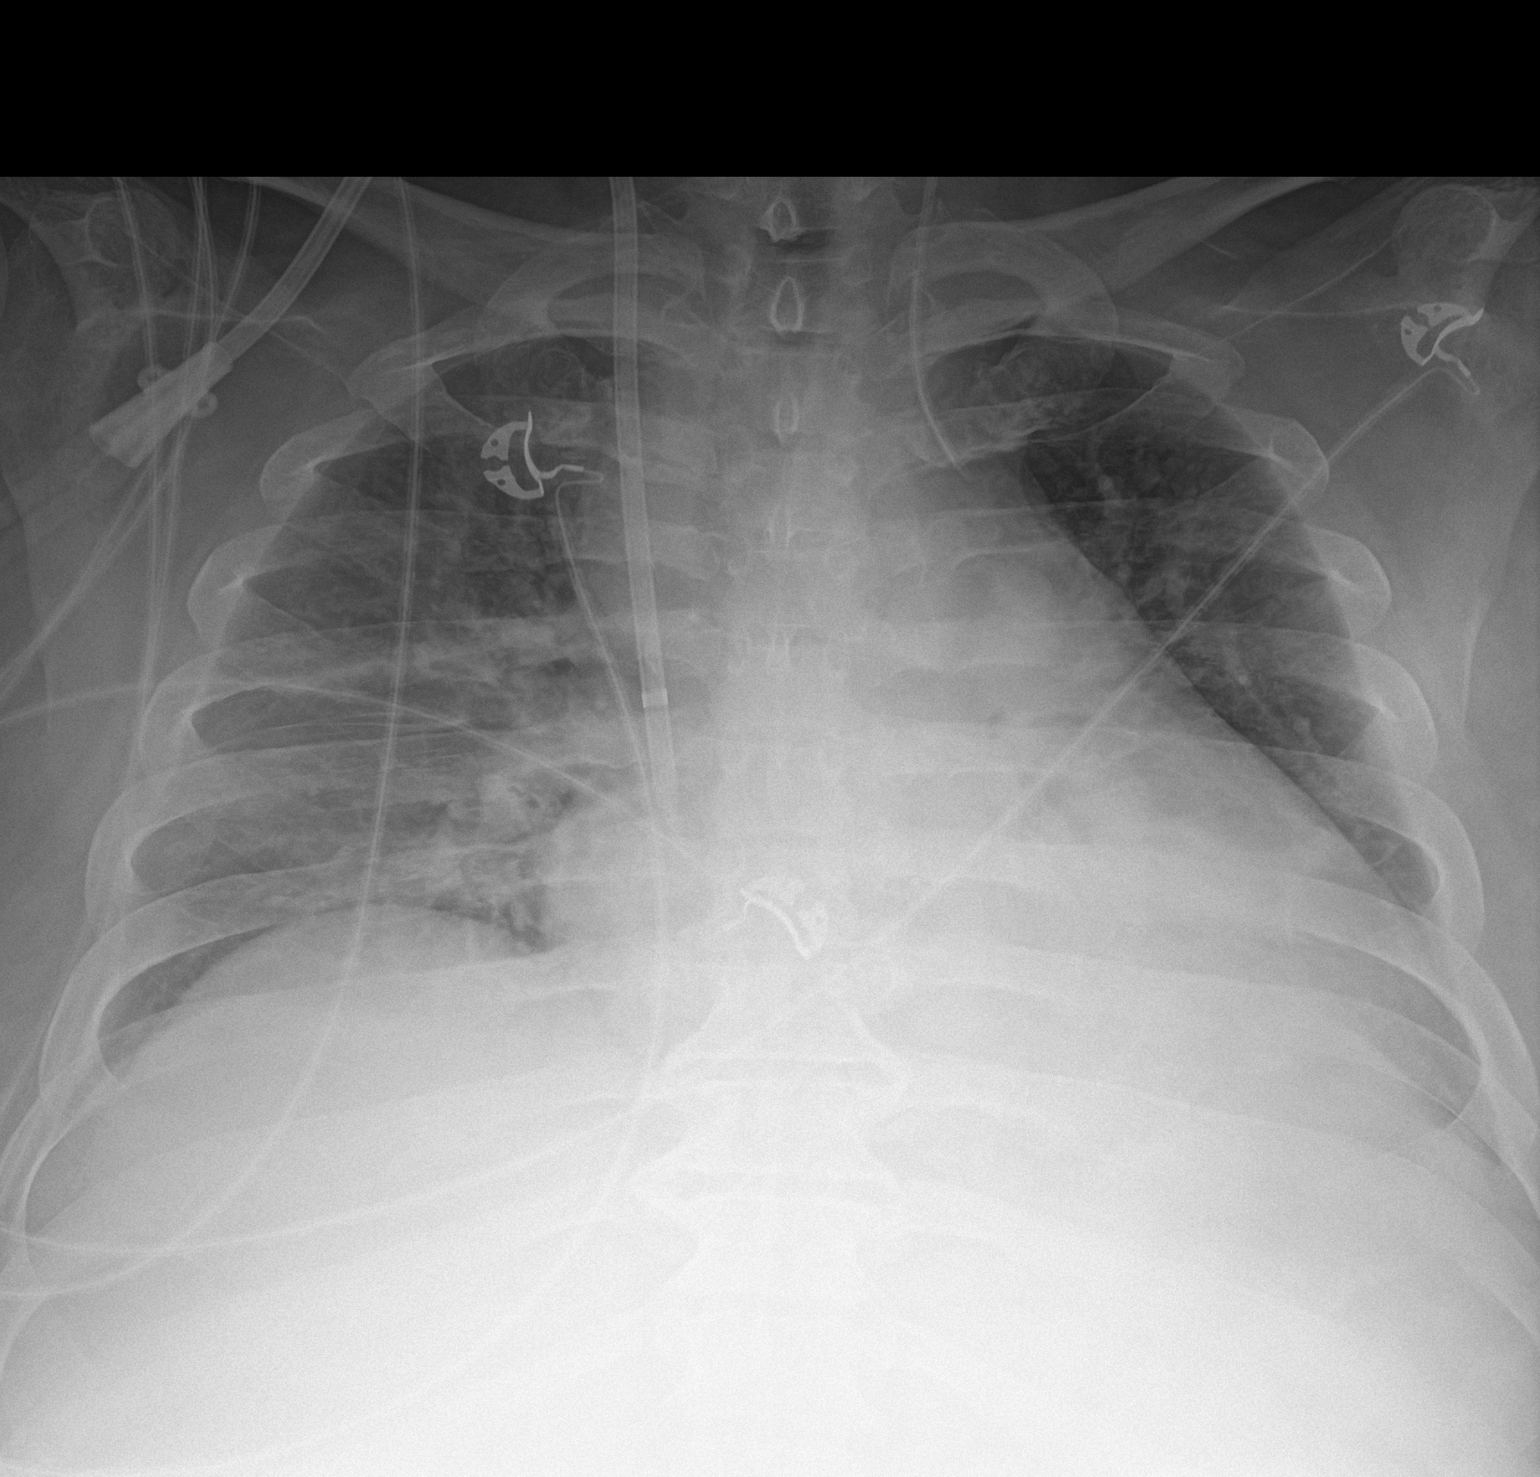

[1 of 1 positions shown; findings below may reference images not displayed]

FINDINGS: Stable cardiomegaly. Endotracheal and nasogastric tubes have been
removed. Right internal jugular dialysis catheter is unchanged. Left
internal jugular catheter is noted with tip in left subclavian vein.
No pneumothorax or pleural effusion is noted. Mild bibasilar
subsegmental atelectasis is noted. Bony thorax unremarkable.
IMPRESSION: Endotracheal and nasogastric tubes have been removed. Mild bibasilar
subsegmental atelectasis.

## 2019-04-12 MED ORDER — MAGNESIUM SULFATE 2 GM/50ML IV SOLN
2.0000 g | Freq: Once | INTRAVENOUS | Status: AC
  Administered 2019-04-12: 2 g via INTRAVENOUS
  Filled 2019-04-12: qty 50

## 2019-04-12 MED ORDER — OXYCODONE HCL 5 MG PO TABS
5.0000 mg | ORAL_TABLET | Freq: Four times a day (QID) | ORAL | Status: DC | PRN
  Administered 2019-04-13 – 2019-04-14 (×2): 5 mg via ORAL
  Filled 2019-04-12: qty 1

## 2019-04-12 MED ORDER — VANCOMYCIN HCL IN DEXTROSE 1-5 GM/200ML-% IV SOLN
1000.0000 mg | INTRAVENOUS | Status: DC
  Administered 2019-04-12: 1000 mg via INTRAVENOUS
  Filled 2019-04-12 (×2): qty 200

## 2019-04-12 MED ORDER — DEXTROSE 50 % IV SOLN
INTRAVENOUS | Status: AC
Start: 2019-04-12 — End: 2019-04-12
  Administered 2019-04-12: 50 mL via INTRAVENOUS
  Filled 2019-04-12: qty 50

## 2019-04-12 MED ORDER — DEXTROSE 50 % IV SOLN
50.0000 mL | Freq: Once | INTRAVENOUS | Status: AC
  Administered 2019-04-12: 09:00:00 50 mL via INTRAVENOUS

## 2019-04-12 MED ORDER — LIDOCAINE HCL (PF) 1 % IJ SOLN: 5.0000 mL | INTRAMUSCULAR | Status: DC | PRN

## 2019-04-12 MED ORDER — MIDODRINE HCL 5 MG PO TABS
10.0000 mg | ORAL_TABLET | ORAL | Status: DC
  Administered 2019-04-12 – 2019-04-26 (×7): 10 mg via ORAL
  Filled 2019-04-12 (×15): qty 2

## 2019-04-12 MED ORDER — SODIUM CHLORIDE 0.9 % IV SOLN: 100.0000 mL | INTRAVENOUS | Status: DC | PRN

## 2019-04-12 MED ORDER — LORAZEPAM 2 MG/ML IJ SOLN
1.0000 mg | Freq: Once | INTRAMUSCULAR | Status: AC
  Administered 2019-04-12: 1 mg via INTRAVENOUS
  Filled 2019-04-12: qty 1

## 2019-04-12 MED ORDER — HEPARIN SODIUM (PORCINE) 1000 UNIT/ML DIALYSIS
20.0000 [IU]/kg | INTRAMUSCULAR | Status: DC | PRN
  Filled 2019-04-12: qty 3

## 2019-04-12 MED ORDER — IOHEXOL 350 MG/ML SOLN
100.0000 mL | Freq: Once | INTRAVENOUS | Status: AC | PRN
  Administered 2019-04-12: 100 mL via INTRAVENOUS

## 2019-04-12 MED ORDER — LIDOCAINE-PRILOCAINE 2.5-2.5 % EX CREA: 1.0000 "application " | TOPICAL_CREAM | CUTANEOUS | Status: DC | PRN

## 2019-04-12 MED ORDER — DARBEPOETIN ALFA 150 MCG/0.3ML IJ SOSY
PREFILLED_SYRINGE | INTRAMUSCULAR | Status: AC
Start: 2019-04-12 — End: 2019-04-12
  Administered 2019-04-12: 150 ug
  Filled 2019-04-12: qty 0.3

## 2019-04-12 MED ORDER — HEPARIN SODIUM (PORCINE) 1000 UNIT/ML IJ SOLN
INTRAMUSCULAR | Status: AC
  Filled 2019-04-12: qty 4

## 2019-04-12 MED ORDER — PENTAFLUOROPROP-TETRAFLUOROETH EX AERO: 1.0000 "application " | INHALATION_SPRAY | CUTANEOUS | Status: DC | PRN

## 2019-04-12 MED ORDER — ALTEPLASE 2 MG IJ SOLR: 2.0000 mg | Freq: Once | INTRAMUSCULAR | Status: DC | PRN

## 2019-04-12 MED ORDER — HEPARIN SODIUM (PORCINE) 1000 UNIT/ML DIALYSIS
1000.0000 [IU] | INTRAMUSCULAR | Status: DC | PRN
  Filled 2019-04-12: qty 1

## 2019-04-12 MED ORDER — FENTANYL CITRATE (PF) 100 MCG/2ML IJ SOLN
25.0000 ug | INTRAMUSCULAR | Status: DC | PRN
  Administered 2019-04-12 (×2): 25 ug via INTRAVENOUS
  Filled 2019-04-12 (×3): qty 2

## 2019-04-12 MED ORDER — DARBEPOETIN ALFA 150 MCG/0.3ML IJ SOSY
PREFILLED_SYRINGE | INTRAMUSCULAR | Status: AC
  Filled 2019-04-12: qty 0.3

## 2019-04-12 NOTE — Progress Notes (Signed)
ANTICOAGULATION CONSULT NOTE  Pharmacy Consult:  Heparin Indication:  Rule out  No Known Allergies  Patient Measurements: Height: 5\' 10"  (177.8 cm) Weight: 245 lb 13 oz (111.5 kg) IBW/kg (Calculated) : 73 Heparin Dosing Weight: 100 kg  Vital Signs: Temp: 97.7 F (36.5 C) (09/22 2347) Temp Source: Oral (09/22 2347) BP: 95/68 (09/23 0000) Pulse Rate: 44 (09/23 0000)  Labs: Recent Labs    04/10/19 0007 04/10/19 0309  04/10/19 0518 04/10/19 0953  04/11/19 0500 04/11/19 1430 04/11/19 2359  HGB 8.8*  --    < > 8.5* 8.6*  --  7.8*  --   --   HCT 29.0*  --    < > 28.1* 27.7*  --  25.9*  --   --   PLT 333  --   --  300 292  --  279  --   --   LABPROT  --  18.5*  --   --   --   --  18.3*  --   --   INR  --  1.6*  --   --   --   --  1.5*  --   --   HEPARINUNFRC  --   --   --   --   --    < > 0.14* 0.23* 0.33  CREATININE 5.54*  --   --  5.70*  --   --  4.54*  --   --    < > = values in this interval not displayed.    Estimated Creatinine Clearance: 25.2 mL/min (A) (by C-G formula based on SCr of 4.54 mg/dL (H)).   Assessment: 12 YOM with complicated hospital stay decompensated, requiring intubation and transfer to the ICU on 04/10/19.  Pharmacy consulted to dose IV heparin for rule out PE.  Unable to do CTA given renal dysfunction.  Doppler ordered.  Heparin level therapeutic at 0.33 s/p rate increase to 2700 units/hr  Goal of Therapy:  Heparin level 0.3-0.7 units/ml Monitor platelets by anticoagulation protocol: Yes   Plan:  Increase heparin gtt to 2750 units/hr Confirm with AM labs  Bertis Ruddy, PharmD Clinical Pharmacist Please check AMION for all [place] numbers 04/12/2019 12:52 AM

## 2019-04-12 NOTE — Progress Notes (Signed)
Notified Kirby,NP that patient is trying to  jump out of bed and pull his IV out stating "I want to go". Attempt to redirect patient failed. Patient requested to speak to his brother Loistine Simas  and is c on the phone with him right now. Will continue to monitor.   Erandi Lemma,RN.

## 2019-04-12 NOTE — Progress Notes (Signed)
[facility] NEPHROLOGY PROGRESS NOTE  Assessment/ Plan: Pt is a 48 y.o. yo male with ESRD on HD, hypertension, admitted with sepsis due to right lower extremity cellulitis, calciphylaxis wound.  Patient had cardiac arrest on 9/21 and moved to ICU. HD orders: [facility] MWF 4 hrs 180NRe 400/800 120 kg 2.0 K/ 2.25 Ca (Change to 4.0 K /2.0 Ca bath on DC) -Heparin 4000 units IV TIW -Calcitriol 0.25 mcg PO TIW  #Shock suspect septic shock: Currently on vancomycin and Zosyn pending culture data.  1 bottle is growing coag negative methicillin-resistant staph.  Also receiving heparin infusion.  Patient is off of pressors with acceptable BP this morning.  # ESRD: Patient had serial dialysis in the beginning now on MWF schedule.  He tolerated intermittent dialysis on Monday.  He is now extubated with acceptable blood pressure reading.  Plan for intermittent hemodialysis today at bedside.  UF goal 1 to 1.5 kg, order a dose of midodrine.  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  Continue low calcium bath, avoid VDRA.  Continue sodium thiosulfate.  Wound care and pain management per primary team.  Orthopedics was consulted.  #Acute respiratory failure with hypoxia: Extubated now on nasal cannula.  PCCM is following.  # Anemia: Monitor hemoglobin.  Continue ESA.  Transfuse as needed.  # Secondary hyperparathyroidism: Use low calcium bath, avoid calcium based binders.  Continue Auryxia able to take orally.  #Hypotension/volume: Blood pressure improved, off pressor today.  #Dialysis Access: Patient was scheduled for elective second stage basilic vein transposition on 9/21 which was postponed because of cardiac arrest.  [facility] is following  Subjective: Seen and examined in ICU.  He is extubated.  Off pressor.  Blood pressure acceptable. He is alert awake.  Has some shortness of breath.  Denies chest pain, nausea vomiting.  Concern  about leg wounds. Objective Vital signs in last 24 hours: Vitals:   04/12/19 0630 04/12/19 0700 04/12/19 0730 04/12/19 0800  BP: 109/60 105/60 92/61   Pulse: 78 75 68   Resp: 17 15 15    Temp:    98.8 F (37.1 C)  TempSrc:    Oral  SpO2: 95% 100% 98%   Weight:      Height:       Weight change:   Intake/Output Summary (Last 24 hours) at 04/12/2019 1033 Last data filed at 04/12/2019 0600 Gross per 24 hour  Intake 1129.26 ml  Output 125 ml  Net 1004.26 ml       Labs: Basic Metabolic Panel: Recent Labs  Lab 04/10/19 0518 04/11/19 0500 04/12/19 0522  NA 135 134* 135  K 4.4 3.4* 3.7  CL 103 102 101  CO2 20* 24 22  GLUCOSE 152* 95 77  BUN 39* 28* 36*  CREATININE 5.70* 4.54* 6.02*  CALCIUM 8.3* 7.7* 8.1*  PHOS 4.4 3.5 5.3*   Liver Function Tests: Recent Labs  Lab 04/11/19 0500 04/11/19 1001 04/12/19 0522  AST  --  22  --   ALT  --  15  --   ALKPHOS  --  84  --   BILITOT  --  0.4  --   PROT  --  7.3  --   ALBUMIN 1.5* 1.6* 1.6*   No results for input(s): LIPASE, AMYLASE in the last 168 hours. No results for input(s): AMMONIA in the last 168 hours. CBC: Recent Labs  Lab 04/10/19 0007  04/10/19 0518 04/10/19 0953 04/11/19 0500 04/12/19 0549  WBC 35.1*  --  33.0* 29.9* 26.3* 25.2*  NEUTROABS  --   --   --   --   --  20.0*  HGB 8.8*   < > 8.5* 8.6* 7.8* 7.6*  HCT 29.0*   < > 28.1* 27.7* 25.9* 27.1*  MCV 92.4  --  92.4 91.1 93.2 97.1  PLT 333  --  300 292 279 307   < > = values in this interval not displayed.   Cardiac Enzymes: No results for input(s): CKTOTAL, CKMB, CKMBINDEX, TROPONINI in the last 168 hours. CBG: Recent Labs  Lab 04/11/19 2116 04/11/19 2148 04/11/19 2353 04/12/19 0347 04/12/19 0823  GLUCAP 69* 106* 78 78 61*    Iron Studies: No results for input(s): IRON, TIBC, TRANSFERRIN, FERRITIN in the last 72 hours. Studies/Results: Dg Chest Port 1 View  Result Date: 04/12/2019 CLINICAL DATA:  Respiratory failure. EXAM: PORTABLE CHEST  1 VIEW COMPARISON:  Radiograph April 10, 2019. FINDINGS: Stable cardiomegaly. Endotracheal and nasogastric tubes have been removed. Right internal jugular dialysis catheter is unchanged. Left internal jugular catheter is noted with tip in left subclavian vein. No pneumothorax or pleural effusion is noted. Mild bibasilar subsegmental atelectasis is noted. Bony thorax unremarkable. IMPRESSION: Endotracheal and nasogastric tubes have been removed. Mild bibasilar subsegmental atelectasis. Electronically Signed   By: Marijo Conception M.D.   On: 04/12/2019 07:32    Medications: Infusions: . sodium chloride    . sodium chloride    . heparin 2,750 Units/hr (04/12/19 1025)  . piperacillin-tazobactam (ZOSYN)  IV 12.5 mL/hr at 04/12/19 0600  . sodium thiosulfate infusion for calciphylaxis 25 g (04/07/19 1434)  . vancomycin 1,000 mg (04/10/19 1143)    Scheduled Medications: . atorvastatin  40 mg Oral Q2000  . B-complex with vitamin C  1 tablet Per Tube Daily  . chlorhexidine  60 mL Topical Once  . chlorhexidine  60 mL Topical Once   And  . chlorhexidine  60 mL Topical Once  . Chlorhexidine Gluconate Cloth  6 each Topical Q0600  . citalopram  40 mg Oral Daily  . darbepoetin (ARANESP) injection - DIALYSIS  150 mcg Intravenous Q Wed-HD  . fentaNYL  1 patch Transdermal Q72H  . ferric citrate  420 mg Oral TID WC  . gabapentin  300 mg Oral QHS  . heparin      . mouth rinse  15 mL Mouth Rinse BID  . Melatonin  4.5 mg Oral QHS  . multivitamin  15 mL Per Tube Daily  . pantoprazole  40 mg Oral Daily  . tamsulosin  0.4 mg Oral QPC breakfast    have reviewed scheduled and prn medications.  Physical Exam: General: Extubated, on nasal cannula, lying in bed, not in distress Heart:RRR, s1s2 nl Lungs: Coarse breath sound bilateral, no wheezing Abdomen:soft,  non-distended Extremities: Bilateral legs with multiple calciphylaxis wounds and excoriation. Dialysis Access: Right IJ TDC, left aVF   Lynesha Bango  Prasad Raylan Troiani 04/12/2019,10:33 AM  LOS: 15 days  Pager: [phone]

## 2019-04-12 NOTE — Progress Notes (Signed)
Notified Dr. Tyrell Antonio that pt. cannot go to CT stat due to dialysis just started. MD Aware.

## 2019-04-12 NOTE — Progress Notes (Signed)
[name], male   DOB: 05-Sep-1970, 48 y.o.   MRN: [id number] Patient is seen in follow-up for the calciphylaxis involving both lower extremities.  Examination of the right lower extremity patient has had interval healing since the last exam with improvement of the calciphylaxis.  The black gangrenous eschar has decreased.  There is no purulent drainage no cellulitis he does have tenderness to palpation.  I feel that we can continue to watch these lesions and continue with the current care which seems to be working.

## 2019-04-12 NOTE — Progress Notes (Signed)
Bilateral lower extremity venous duplex has been completed. Preliminary results can be found in CV Proc through chart review.   04/12/19 11:19 AM Carlos Levering RVT

## 2019-04-12 NOTE — Progress Notes (Addendum)
PROGRESS NOTE    Michael Riggs  [id number] DOB: 02-Nov-1970 DOA: 03/28/2019 PCP: [name], MD   Brief Narrative: 48 year old with past medical history significant for end-stage renal disease on hemodialysis TTS, hypertension, hyperlipidemia who was sent from a skilled nursing facility for evaluation of fever, confusion and right lower extremity pain concerning for infection.  Over the last 2 months he had been admitted to the hospital for worsening renal function and fluid overload.  Patient was a started on dialysis and eventually sent to rehab.  This time he was admitted for sepsis secondary to right lower extremity cellulitis with concern for necrotic tissue.  Orthopedic and plastic surgery was consulted they recommend medical management and have Dr. Sharol Given follow once he returns from his vacation. Hospital course complicated by profound hypotension and shock with loss of consciousness and require intubation.  Patient received CPR, required intubation.  Transfer to the ICU.  He was a started on IV pressors subsequently weaned off IV pressors.  He was extubated on 04/11/2019.  Patient continues to have severe lower extremity pain, Dr. Sharol Given will follow-up in consultation today.    Assessment & Plan:   Principal Problem:   Wound infection Active Problems:   Hypertension   ESRD (end stage renal disease) on dialysis ([place])   Dyslipidemia   Diabetes mellitus with peripheral vascular disease ([place])   Depression   Physical deconditioning   1-Acute hypoxic respiratory failure; Patient lost consciousness on 9/21, became severely hypotensive.  CODE BLUE was called and he was intubated and received CPR. Patient was extubated on 9/22. Respiratory status stable. He was a started on IV heparin to cover for PE.  Echo with mild dilation of right ventricle.  CCM is recommending to continue with heparin and to discuss with nephrology possibility of performing CTA to rule out PE Doppler lower  extremity limited status, negative for DVT Discussed with nephrology, okay to proceed with ordering CT angios to rule out PE  2-acute metabolic encephalopathy: Patient loss of consciousness, might be related to hypotension versus pain medication. Alert and oriented x3. Improved.  Shock,  Patient requiring IV pressors. Now off of pressors. Continue with IV antibiotics for now. 1 of 2 blood cultures positive for staph, awaiting final culture report.  Chronic normocytic anemia: Continue to monitor closely.  End-stage renal disease on hemodialysis. Per nephrology. Immature fistula getting dialysis through hemodialysis tunneled catheter.  Right lower extremity wound, likely calciphylaxis: He continued to have leukocytosis mild fever.  He continues to have lot of pain. I have consulted Dr. Sharol Given  Today.  Nutrition: Speech swallow evaluation. Diarrhea: C. difficile test -10 days ago. Peripheral vascular disease, status post left AKA.  Pressure Injury 02/27/19 Penis WOC evaluation day of PIP data, this wound is trauma related to [facility] insertions and removals and documented abnormal male penile tissue per urology (Active)  02/27/19 1000  Location: Penis  Location Orientation:   Staging:   Wound Description (Comments): WOC evaluation day of PIP data, this wound is trauma related to [facility] insertions and removals and documented abnormal male penile tissue per urology  Present on Admission: No     Nutrition Problem: Increased nutrient needs Etiology: wound healing    Signs/Symptoms: estimated needs    Interventions: MVI, Prostat  Estimated body mass index is 35.27 kg/m as calculated from the following:   Height as of this encounter: 5\' 10"  (1.778 m).   Weight as of this encounter: 111.5 kg.   DVT prophylaxis: On heparin drip Code  Status: Full code Family Communication: Care discussed with patient Disposition Plan: Transfer to the stepdown unit Consultants:   Dr.  Sharol Given  Nephrology  CCM  Procedures:   Echo: Normal ejection fraction mild dilated right ventricle  Antimicrobials:  Vancomycin and Zosyn  Subjective: He is alert, he is complaining of generalized pain, he report worsening right lower extremity pain.  He reported mild abdominal discomfort.  Objective: Vitals:   04/12/19 0500 04/12/19 0530 04/12/19 0609 04/12/19 0630  BP: (!) 109/56 106/72 (!) 95/30 109/60  Pulse:  77 75 78  Resp: 15 16 14 17   Temp:      TempSrc:      SpO2:  99% 93% 95%  Weight:      Height:        Intake/Output Summary (Last 24 hours) at 04/12/2019 0741 Last data filed at 04/12/2019 0600 Gross per 24 hour  Intake 1566.61 ml  Output 125 ml  Net 1441.61 ml   Filed Weights   04/10/19 0105 04/10/19 0900 04/10/19 1215  Weight: 111.5 kg 111.5 kg 111.5 kg    Examination:  General exam: Appears calm and comfortable  Respiratory system: Clear to auscultation. Respiratory effort normal. Cardiovascular system: S1 & S2 heard, RRR. No JVD, murmurs, rubs, gallops or clicks. No pedal edema. Gastrointestinal system: Abdomen is nondistended, soft and nontender. No organomegaly or masses felt. Normal bowel sounds heard. Central nervous system: Alert and oriented.  Extremities: Dressing right lower extremity, left AKA. Skin: Necrotic skin of right lower extremity   Data Reviewed: I have personally reviewed following labs and imaging studies  CBC: Recent Labs  Lab 04/10/19 0007 04/10/19 0340 04/10/19 0518 04/10/19 0953 04/11/19 0500 04/12/19 0549  WBC 35.1*  --  33.0* 29.9* 26.3* 25.2*  NEUTROABS  --   --   --   --   --  20.0*  HGB 8.8* 9.9* 8.5* 8.6* 7.8* 7.6*  HCT 29.0* 29.0* 28.1* 27.7* 25.9* 27.1*  MCV 92.4  --  92.4 91.1 93.2 97.1  PLT 333  --  300 292 279 [id number]   Basic Metabolic Panel: Recent Labs  Lab 04/08/19 0932 04/09/19 0802 04/10/19 0007 04/10/19 0340 04/10/19 0518 04/11/19 0500 04/11/19 1001 04/12/19 0522  NA 134* 131* 130* 137 135  134*  --  135  K 4.2 3.9 4.3 4.5 4.4 3.4*  --  3.7  CL 96* 94* 95*  --  103 102  --  101  CO2 25 24 23   --  20* 24  --  22  GLUCOSE 99 116* 130*  --  152* 95  --  77  BUN 20 25* 38*  --  39* 28*  --  36*  CREATININE 4.02* 4.10* 5.54*  --  5.70* 4.54*  --  6.02*  CALCIUM 8.6* 8.3* 8.2*  --  8.3* 7.7*  --  8.1*  MG 2.0 1.8 1.7  --   --   --  1.8 1.8  PHOS 3.4 3.7 3.4  --  4.4 3.5  --  5.3*   GFR: Estimated Creatinine Clearance: 19 mL/min (A) (by C-G formula based on SCr of 6.02 mg/dL (H)). Liver Function Tests: Recent Labs  Lab 04/10/19 0007 04/10/19 0518 04/11/19 0500 04/11/19 1001 04/12/19 0522  AST  --   --   --  22  --   ALT  --   --   --  15  --   ALKPHOS  --   --   --  84  --  BILITOT  --   --   --  0.4  --   PROT  --   --   --  7.3  --   ALBUMIN 1.8* 1.7* 1.5* 1.6* 1.6*   No results for input(s): LIPASE, AMYLASE in the last 168 hours. No results for input(s): AMMONIA in the last 168 hours. Coagulation Profile: Recent Labs  Lab 04/10/19 0309 04/11/19 0500 04/12/19 0522  INR 1.6* 1.5* 1.5*   Cardiac Enzymes: No results for input(s): CKTOTAL, CKMB, CKMBINDEX, TROPONINI in the last 168 hours. BNP (last 3 results) No results for input(s): PROBNP in the last 8760 hours. HbA1C: No results for input(s): HGBA1C in the last 72 hours. CBG: Recent Labs  Lab 04/10/19 1548 04/11/19 2116 04/11/19 2148 04/11/19 2353 04/12/19 0347  GLUCAP 147* 69* 106* 78 78   Lipid Profile: No results for input(s): CHOL, HDL, LDLCALC, TRIG, CHOLHDL, LDLDIRECT in the last 72 hours. Thyroid Function Tests: No results for input(s): TSH, T4TOTAL, FREET4, T3FREE, THYROIDAB in the last 72 hours. Anemia Panel: No results for input(s): VITAMINB12, FOLATE, FERRITIN, TIBC, IRON, RETICCTPCT in the last 72 hours. Sepsis Labs: Recent Labs  Lab 04/10/19 0007 04/10/19 0309  LATICACIDVEN 1.6 1.7    Recent Results (from the past 240 hour(s))  Novel Coronavirus, NAA (hospital order; send-out  to ref lab)     Status: None   Collection Time: 04/02/19  3:38 PM   Specimen: Nasopharyngeal Swab; Respiratory  Result Value Ref Range Status   SARS-CoV-2, NAA NOT DETECTED NOT DETECTED Final    Comment: (NOTE) This nucleic acid amplification test was developed and its performance characteristics determined by [employer]. Nucleic acid amplification tests include PCR and TMA. This test has not been FDA cleared or approved. This test has been authorized by FDA under an Emergency Use Authorization (EUA). This test is only authorized for the duration of time the declaration that circumstances exist justifying the authorization of the emergency use of in vitro diagnostic tests for detection of SARS-CoV-2 virus and/or diagnosis of COVID-19 infection under section 564(b)(1) of the Act, 21 U.S.C. [id number]) (1), unless the authorization is terminated or revoked sooner. When diagnostic testing is negative, the possibility of a false negative result should be considered in the context of a patient's recent exposures and the presence of clinical signs and symptoms consistent with COVID-19. An individual without symptoms of COVID- 19 and who is not shedding SARS-CoV-2 vi rus would expect to have a negative (not detected) result in this assay. Performed At: [facility] [address] [place], [place] [id number] Rush Farmer MD [id number]    [place]  Final    Comment: Performed at [facility], [place] [address], [place], [place] [address]  SARS CORONAVIRUS 2 (TAT 6-24 HRS) Nasopharyngeal Nasopharyngeal Swab     Status: None   Collection Time: 04/09/19  3:15 PM   Specimen: Nasopharyngeal Swab  Result Value Ref Range Status   SARS Coronavirus 2 NEGATIVE NEGATIVE Final    Comment: (NOTE) SARS-CoV-2 target nucleic acids are NOT DETECTED. The SARS-CoV-2 RNA is generally detectable in upper and lower respiratory specimens during the acute phase  of infection. Negative results do not preclude SARS-CoV-2 infection, do not rule out co-infections with other pathogens, and should not be used as the sole basis for treatment or other patient management decisions. Negative results must be combined with clinical observations, patient history, and epidemiological information. The expected result is Negative. Fact Sheet for Patients: SugarRoll.be Fact Sheet for  Healthcare Providers: https://www.woods-mathews.com/ This test is not yet approved or cleared by the [place] and  has been authorized for detection and/or diagnosis of SARS-CoV-2 by FDA under an Emergency Use Authorization (EUA). This EUA will remain  in effect (meaning this test can be used) for the duration of the COVID-19 declaration under Section 56 4(b)(1) of the Act, 21 U.S.C. section 360bbb-3(b)(1), unless the authorization is terminated or revoked sooner. Performed at [facility], [place] [address], [place], [place] [address]   MRSA PCR Screening     Status: None   Collection Time: 04/10/19  1:48 AM   Specimen: Nasal Mucosa; Nasopharyngeal  Result Value Ref Range Status   MRSA by PCR NEGATIVE NEGATIVE Final    Comment:        The GeneXpert MRSA Assay (FDA approved for NASAL specimens only), is one component of a comprehensive MRSA colonization surveillance program. It is not intended to diagnose MRSA infection nor to guide or monitor treatment for MRSA infections. Performed at [facility], [place] [address] [place], [place] [address]   Culture, blood (Routine X 2) w Reflex to ID Panel     Status: None (Preliminary result)   Collection Time: 04/10/19  2:07 AM   Specimen: BLOOD RIGHT HAND  Result Value Ref Range Status   Specimen Description BLOOD RIGHT HAND  Final   Special Requests   Final    BOTTLES DRAWN AEROBIC AND ANAEROBIC Blood Culture adequate volume   Culture  Setup Time   Final    GRAM  POSITIVE COCCI AEROBIC BOTTLE ONLY Organism ID to follow CRITICAL RESULT CALLED TO, READ BACK BY AND VERIFIED WITH: PHRMD J ORIET @ 0612 04/11/19 BY S GEZAHEGN    Culture   Final    NO GROWTH 1 DAY Performed at [facility], [place] [address], [place], [place] [address]    Report Status PENDING  Incomplete  Blood Culture ID Panel (Reflexed)     Status: Abnormal   Collection Time: 04/10/19  2:07 AM  Result Value Ref Range Status   Enterococcus species NOT DETECTED NOT DETECTED Final   Listeria monocytogenes NOT DETECTED NOT DETECTED Final   Staphylococcus species DETECTED (A) NOT DETECTED Final    Comment: Methicillin (oxacillin) resistant coagulase negative staphylococcus. Possible blood culture contaminant (unless isolated from more than one blood culture draw or clinical case suggests pathogenicity). No antibiotic treatment is indicated for blood  culture contaminants. CRITICAL RESULT CALLED TO, READ BACK BY AND VERIFIED WITH: PHRMD J ORIET @ 0612 04/11/19 BY S GEZAHEGN    Staphylococcus aureus (BCID) NOT DETECTED NOT DETECTED Final   Methicillin resistance DETECTED (A) NOT DETECTED Final    Comment: CRITICAL RESULT CALLED TO, READ BACK BY AND VERIFIED WITH: PHRMD J ORIET @ [id number] 04/11/19 BY S GEZAHEGN    Streptococcus species NOT DETECTED NOT DETECTED Final   Streptococcus agalactiae NOT DETECTED NOT DETECTED Final   Streptococcus pneumoniae NOT DETECTED NOT DETECTED Final   Streptococcus pyogenes NOT DETECTED NOT DETECTED Final   Acinetobacter baumannii NOT DETECTED NOT DETECTED Final   Enterobacteriaceae species NOT DETECTED NOT DETECTED Final   Enterobacter cloacae complex NOT DETECTED NOT DETECTED Final   Escherichia coli NOT DETECTED NOT DETECTED Final   Klebsiella oxytoca NOT DETECTED NOT DETECTED Final   Klebsiella pneumoniae NOT DETECTED NOT DETECTED Final   Proteus species NOT DETECTED NOT DETECTED Final   Serratia marcescens NOT DETECTED NOT DETECTED Final    Haemophilus influenzae  NOT DETECTED NOT DETECTED Final   Neisseria meningitidis NOT DETECTED NOT DETECTED Final   Pseudomonas aeruginosa NOT DETECTED NOT DETECTED Final   Candida albicans NOT DETECTED NOT DETECTED Final   Candida glabrata NOT DETECTED NOT DETECTED Final   Candida krusei NOT DETECTED NOT DETECTED Final   Candida parapsilosis NOT DETECTED NOT DETECTED Final   Candida tropicalis NOT DETECTED NOT DETECTED Final    Comment: Performed at [facility], [place] [address], [place], [place] [address]  Culture, blood (Routine X 2) w Reflex to ID Panel     Status: None (Preliminary result)   Collection Time: 04/10/19  4:00 AM   Specimen: BLOOD  Result Value Ref Range Status   Specimen Description BLOOD CENTRAL LINE  Final   Special Requests   Final    BOTTLES DRAWN AEROBIC AND ANAEROBIC Blood Culture adequate volume   Culture   Final    NO GROWTH 1 DAY Performed at [facility], [place] [address] [place], [place] [address]    Report Status PENDING  Incomplete  Culture, respiratory (non-expectorated)     Status: None (Preliminary result)   Collection Time: 04/11/19 10:20 AM   Specimen: Tracheal Aspirate; Respiratory  Result Value Ref Range Status   Specimen Description TRACHEAL ASPIRATE  Final   Special Requests NONE  Final   Gram Stain   Final    RARE WBC PRESENT,BOTH PMN AND MONONUCLEAR NO ORGANISMS SEEN Performed at [facility], [address] [place], [place] [address]    Culture PENDING  Incomplete   Report Status PENDING  Incomplete         Radiology Studies: Dg Chest Port 1 View  Result Date: 04/12/2019 CLINICAL DATA:  Respiratory failure. EXAM: PORTABLE CHEST 1 VIEW COMPARISON:  Radiograph April 10, 2019. FINDINGS: Stable cardiomegaly. Endotracheal and nasogastric tubes have been removed. Right internal jugular dialysis catheter is unchanged. Left internal jugular catheter is noted with tip in left subclavian vein. No pneumothorax or pleural  effusion is noted. Mild bibasilar subsegmental atelectasis is noted. Bony thorax unremarkable. IMPRESSION: Endotracheal and nasogastric tubes have been removed. Mild bibasilar subsegmental atelectasis. Electronically Signed   By: Marijo Conception M.D.   On: 04/12/2019 07:32        Scheduled Meds: . atorvastatin  40 mg Oral Q2000  . B-complex with vitamin C  1 tablet Per Tube Daily  . chlorhexidine  60 mL Topical Once  . chlorhexidine  60 mL Topical Once   And  . chlorhexidine  60 mL Topical Once  . Chlorhexidine Gluconate Cloth  6 each Topical Q0600  . citalopram  40 mg Oral Daily  . darbepoetin (ARANESP) injection - DIALYSIS  150 mcg Intravenous Q Wed-HD  . fentaNYL  1 patch Transdermal Q72H  . ferric citrate  420 mg Oral TID WC  . gabapentin  300 mg Oral QHS  . heparin      . influenza vac split quadrivalent PF  0.5 mL Intramuscular Tomorrow-1000  . mouth rinse  15 mL Mouth Rinse BID  . Melatonin  4.5 mg Oral QHS  . multivitamin  15 mL Per Tube Daily  . pantoprazole  40 mg Oral Daily  . tamsulosin  0.4 mg Oral QPC breakfast   Continuous Infusions: . sodium chloride    . sodium chloride    . heparin 2,750 Units/hr (04/12/19 0600)  . phenylephrine (NEO-SYNEPHRINE) Adult infusion Stopped (04/10/19 1118)  . piperacillin-tazobactam (ZOSYN)  IV 12.5 mL/hr at 04/12/19  0600  . sodium thiosulfate infusion for calciphylaxis 25 g (04/07/19 1434)  . vancomycin 1,000 mg (04/10/19 1143)     LOS: 15 days    Time spent: 35 minutes.     Elmarie Shiley, MD [facility] Pager [phone]  If 7PM-7AM, please contact night-coverage [email or web address] Password [facility] 04/12/2019, 7:41 AM

## 2019-04-12 NOTE — Evaluation (Signed)
Occupational Therapy Evaluation Patient Details Name: Michael Riggs MRN: [id number] DOB: 04-Oct-1970 Today's Date: 04/12/2019    History of Present Illness Patient is a 48 year old male with a complex recent medical history essentially admitted 3 months ago almost continuous stay in the hospital with complications related to renal failure initiation of dialysis. Intubated from 9/21 to 9/22.    Clinical Impression   Pt re-admit from CIR, today presents with decreased cognition, activity tolerance, balance, and overall requiring at least mod A for UB ADL, max to total A for LB ADL. At this time recommending SNF post-acute and continued OT services while hospitalized to maximize safety and independence in ADL. Next session to focus on direction following, seated balance as precursor to participation in ADL and activity tolerance.     Follow Up Recommendations  SNF;Supervision/Assistance - 24 hour    Equipment Recommendations  Other (comment)(defer to next venue of care)    Recommendations for Other Services Other (comment)(Palliative)     Precautions / Restrictions Precautions Precautions: Fall Precaution Comments: L BKA with prosthesis Restrictions Weight Bearing Restrictions: No      Mobility Bed Mobility Overal bed mobility: Needs Assistance Bed Mobility: Rolling;Supine to Sit;Sit to Sidelying Rolling: Mod assist;+2 for safety/equipment   Supine to sit: Max assist;+2 for physical assistance;+2 for safety/equipment;HOB elevated   Sit to sidelying: Max assist;+2 for physical assistance;+2 for safety/equipment General bed mobility comments: multimodal cues for all aspects of bed mobility, assist for BLE, use of pad to assist with hips, trunk elevation and support for back down- total A for legs to return to bed, use of bed pad to assist with turning. Pt does attempt to physically assist with bed rails  Transfers                 General transfer comment: Nt this  session    Balance Overall balance assessment: Needs assistance Sitting-balance support: Single extremity supported;Bilateral upper extremity supported(RLE supported) Sitting balance-Leahy Scale: Poor Sitting balance - Comments: dependent on at least mod A to sit EOB                                   ADL either performed or assessed with clinical judgement   ADL Overall ADL's : Needs assistance/impaired Eating/Feeding: NPO   Grooming: Minimal assistance;Bed level   Upper Body Bathing: Moderate assistance;Bed level   Lower Body Bathing: Total assistance   Upper Body Dressing : Maximal assistance   Lower Body Dressing: Total assistance   Toilet Transfer: Total assistance Toilet Transfer Details (indicate cue type and reason): currently with flexiseal Toileting- Clothing Manipulation and Hygiene: Total assistance         General ADL Comments: confused throughout session, requires multimodal cues for all aspects of bed mobility and ADL     Vision Baseline Vision/History: Wears glasses Wears Glasses: At all times       Perception     Praxis      Pertinent Vitals/Pain Pain Assessment: Faces Faces Pain Scale: Hurts little more Pain Location: legs, throat Pain Descriptors / Indicators: Discomfort;Grimacing;Moaning;Sore;Tender Pain Intervention(s): Limited activity within patient's tolerance;Monitored during session;Repositioned;Premedicated before session     Hand Dominance Right   Extremity/Trunk Assessment Upper Extremity Assessment Upper Extremity Assessment: Generalized weakness(overall 4-/5)   Lower Extremity Assessment Lower Extremity Assessment: Generalized weakness(BKA LLE, wounds BLE)   Cervical / Trunk Assessment Cervical / Trunk Assessment: Other exceptions Cervical / Trunk Exceptions: excess  body habitus   Communication Communication Communication: No difficulties   Cognition Arousal/Alertness: Awake/alert Behavior During Therapy:  WFL for tasks assessed/performed Overall Cognitive Status: Impaired/Different from baseline Area of Impairment: Orientation;Attention;Memory;Following commands;Safety/judgement;Awareness;Problem solving                 Orientation Level: Disoriented to;Place Current Attention Level: Selective Memory: Decreased recall of precautions;Decreased short-term memory Following Commands: Follows one step commands with increased time;Follows one step commands inconsistently Safety/Judgement: Decreased awareness of safety;Decreased awareness of deficits Awareness: Emergent Problem Solving: Slow processing;Decreased initiation;Difficulty sequencing;Requires verbal cues General Comments: Pt disoriented, "where am I?" requires multimodal cues often performing (or attempting to perform) the opposite of what was requested   General Comments       Exercises     Shoulder Instructions      Home Living Family/patient expects to be discharged to:: Skilled nursing facility                                        Prior Functioning/Environment Level of Independence: Independent with assistive device(s)        Comments: prior to admission 3 months ago        OT Problem List: Decreased strength;Decreased activity tolerance;Impaired balance (sitting and/or standing);Decreased cognition;Decreased safety awareness;Decreased knowledge of precautions;Obesity;Pain;Increased edema      OT Treatment/Interventions: Self-care/ADL training;Therapeutic exercise;DME and/or AE instruction;Therapeutic activities;Patient/family education;Balance training    OT Goals(Current goals can be found in the care plan section) Acute Rehab OT Goals Patient Stated Goal: decrease pain OT Goal Formulation: With patient Time For Goal Achievement: 04/26/19 Potential to Achieve Goals: Fair ADL Goals Pt Will Perform Grooming: with set-up;sitting Pt Will Transfer to Toilet: with mod assist;with +2  assist;stand pivot transfer;bedside commode Pt Will Perform Toileting - Clothing Manipulation and hygiene: sitting/lateral leans;with max assist Additional ADL Goal #1: Pt will perform bed mobility at min A prior to engaging in ADL  OT Frequency: Min 2X/week   Barriers to D/C:            Co-evaluation              AM-PAC OT "6 Clicks" Daily Activity     Outcome Measure Help from another person eating meals?: Total(NPO) Help from another person taking care of personal grooming?: A Lot Help from another person toileting, which includes using toliet, bedpan, or urinal?: Total Help from another person bathing (including washing, rinsing, drying)?: A Lot Help from another person to put on and taking off regular upper body clothing?: A Lot Help from another person to put on and taking off regular lower body clothing?: Total 6 Click Score: 9   End of Session Equipment Utilized During Treatment: Oxygen(2L) Nurse Communication: Mobility status  Activity Tolerance: Patient tolerated treatment well Patient left: in bed;with call bell/phone within reach;with bed alarm set  OT Visit Diagnosis: Unsteadiness on feet (R26.81);Other abnormalities of gait and mobility (R26.89);Muscle weakness (generalized) (M62.81);Other symptoms and signs involving cognitive function;Pain Pain - Right/Left: Right(bilateral) Pain - part of body: Leg                Time: [id number] OT Time Calculation (min): 30 min Charges:  OT General Charges $OT Visit: 1 Visit OT Evaluation $OT Eval Moderate Complexity: [place] OTR/L Acute Rehabilitation Services Pager: [phone] Office: [place] 04/12/2019, 1:11 PM

## 2019-04-12 NOTE — Progress Notes (Addendum)
Physical Therapy Evaluation Patient Details Name: Michael Riggs MRN: [id number] DOB: 1970/08/28 Today's Date: 04/12/2019   History of Present Illness  Pt adm from SNF with acute hypoxic respiratory failure and metabolic encephalopathy. Patient is a 48 year old male with a complex recent medical history essentially admitted 3 months ago almost continuous stay in the hospital with complications related to renal failure initiation of dialysis.  Intubated from 9/21 to 9/22.  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   Clinical Impression  Pt admitted with above diagnosis and presents to PT with functional limitations due to deficits listed below (See PT problem list). Pt needs skilled PT to maximize independence and safety to allow discharge back to SNF. Recommend palliative medicine consult for chronic illnesses.      Follow Up Recommendations SNF    Equipment Recommendations  Other (comment)(TBD at next venue)    Recommendations for Other Services       Precautions / Restrictions Precautions Precautions: Fall Precaution Comments: L BKA with prosthesis Restrictions Weight Bearing Restrictions: No      Mobility  Bed Mobility Overal bed mobility: Needs Assistance Bed Mobility: Rolling;Supine to Sit;Sit to Sidelying Rolling: Mod assist;+2 for safety/equipment   Supine to sit: Max assist;+2 for physical assistance;+2 for safety/equipment;HOB elevated   Sit to sidelying: Max assist;+2 for physical assistance;+2 for safety/equipment General bed mobility comments: multimodal cues for all aspects of bed mobility, assist for BLE, use of pad to assist with hips, trunk elevation and support for back down- total A for legs to return to bed, use of bed pad to assist with turning. Pt does attempt to physically assist with bed rails  Transfers                 General transfer comment: Nt this session  Ambulation/Gait                Stairs             Wheelchair Mobility    Modified Rankin (Stroke Patients Only)       Balance Overall balance assessment: Needs assistance Sitting-balance support: Single extremity supported;Bilateral upper extremity supported(RLE supported) Sitting balance-Leahy Scale: Poor Sitting balance - Comments: sat EOB x 5-6 minutes with mod assist. Verbal/tactile cue to shift weight forward  Postural control: Posterior lean                                   Pertinent Vitals/Pain Pain Assessment: Faces Faces Pain Scale: Hurts even more Pain Location: legs, back, scrotum Pain Descriptors / Indicators: Grimacing;Moaning;Sore;Tender Pain Intervention(s): Limited activity within patient's tolerance;Monitored during session;Repositioned    Home Living Family/patient expects to be discharged to:: Skilled nursing facility                      Prior Function Level of Independence: Needs assistance   Gait / Transfers Assistance Needed: During rehab in August pt was performing bed to chair transfers with rolling walker with min assist. Unsure of pt's function at SNF since that time.     Comments: prior to admission 3 months ago     Hand Dominance   Dominant Hand: Right    Extremity/Trunk Assessment   Upper Extremity Assessment Upper Extremity Assessment: Defer to OT evaluation    Lower Extremity Assessment Lower Extremity Assessment: RLE deficits/detail;LLE deficits/detail RLE Deficits / Details: strength <3/5. Pt with decr knee and  hip ROM LLE Deficits / Details: BKA, Strength <3/5. Decr knee/hip ROM.    Cervical / Trunk Assessment Cervical / Trunk Assessment: Other exceptions Cervical / Trunk Exceptions: obese  Communication   Communication: No difficulties  Cognition Arousal/Alertness: Awake/alert Behavior During Therapy: WFL for tasks assessed/performed Overall Cognitive Status: Impaired/Different from baseline Area of Impairment:  Orientation;Attention;Memory;Following commands;Safety/judgement;Awareness;Problem solving                 Orientation Level: Disoriented to;Place;Time;Situation Current Attention Level: Selective Memory: Decreased recall of precautions;Decreased short-term memory Following Commands: Follows one step commands with increased time;Follows one step commands inconsistently Safety/Judgement: Decreased awareness of safety;Decreased awareness of deficits Awareness: Emergent Problem Solving: Slow processing;Decreased initiation;Difficulty sequencing;Requires verbal cues General Comments: Pt disoriented, "where am I?" requires multimodal cues often performing (or attempting to perform) the opposite of what was requested      General Comments      Exercises     Assessment/Plan    PT Assessment Patient needs continued PT services  PT Problem List Decreased strength;Decreased range of motion;Decreased activity tolerance;Decreased balance;Decreased mobility;Decreased cognition;Decreased safety awareness;Obesity;Pain       PT Treatment Interventions DME instruction;Functional mobility training;Therapeutic activities;Therapeutic exercise;Balance training;Patient/family education;Cognitive remediation    PT Goals (Current goals can be found in the Care Plan section)  Acute Rehab PT Goals Patient Stated Goal: decrease pain PT Goal Formulation: Patient unable to participate in goal setting Time For Goal Achievement: 04/26/19 Potential to Achieve Goals: Fair    Frequency Min 2X/week   Barriers to discharge        Co-evaluation               AM-PAC PT "6 Clicks" Mobility  Outcome Measure Help needed turning from your back to your side while in a flat bed without using bedrails?: Total Help needed moving from lying on your back to sitting on the side of a flat bed without using bedrails?: Total Help needed moving to and from a bed to a chair (including a wheelchair)?:  Total Help needed standing up from a chair using your arms (e.g., wheelchair or bedside chair)?: Total Help needed to walk in hospital room?: Total Help needed climbing 3-5 steps with a railing? : Total 6 Click Score: 6    End of Session Equipment Utilized During Treatment: Oxygen Activity Tolerance: Patient limited by pain;Patient limited by fatigue Patient left: in bed;with call bell/phone within reach;with bed alarm set Nurse Communication: Mobility status PT Visit Diagnosis: Other abnormalities of gait and mobility (R26.89);Muscle weakness (generalized) (M62.81);Pain Pain - Right/Left: (bilateral ) Pain - part of body: Leg(back)    Time: [id number] PT Time Calculation (min) (ACUTE ONLY): 29 min   Charges:   PT Evaluation $PT Eval Moderate Complexity: 1 [place] Pager [phone] Office [place] 04/12/2019, 2:24 PM

## 2019-04-12 NOTE — Progress Notes (Signed)
ANTICOAGULATION CONSULT NOTE  Pharmacy Consult:  Heparin Indication:  Rule out PE  No Known Allergies  Patient Measurements: Height: 5\' 10"  (177.8 cm) Weight: 245 lb 13 oz (111.5 kg) IBW/kg (Calculated) : 73 Heparin Dosing Weight: 100 kg  Vital Signs: Temp: 98.8 F (37.1 C) (09/23 0800) Temp Source: Oral (09/23 0800) BP: 92/61 (09/23 0730) Pulse Rate: 68 (09/23 0730)  Labs: Recent Labs    04/10/19 0309  04/10/19 0518 04/10/19 0953  04/11/19 0500 04/11/19 1430 04/11/19 2359 04/12/19 0522 04/12/19 0549  HGB  --    < > 8.5* 8.6*  --  7.8*  --   --   --  7.6*  HCT  --    < > 28.1* 27.7*  --  25.9*  --   --   --  27.1*  PLT  --   --  300 292  --  279  --   --   --  307  LABPROT 18.5*  --   --   --   --  18.3*  --   --  17.5*  --   INR 1.6*  --   --   --   --  1.5*  --   --  1.5*  --   HEPARINUNFRC  --   --   --   --    < > 0.14* 0.23* 0.33 0.47  --   CREATININE  --   --  5.70*  --   --  4.54*  --   --  6.02*  --    < > = values in this interval not displayed.    Estimated Creatinine Clearance: 19 mL/min (A) (by C-G formula based on SCr of 6.02 mg/dL (H)).   Assessment: 24 YOM with complicated hospital stay decompensated, requiring intubation and transfer to the ICU on 04/10/19.  Pharmacy consulted to dose IV heparin for rule out PE.  Unable to do CTA given renal dysfunction.  LE Doppler shows no sign of DVT.  Confirmatory heparin level is therapeutic.H/H low, but stable. No issue with infusion nor bleeding per RN.  Goal of Therapy:  Heparin level 0.3-0.7 units/ml Monitor platelets by anticoagulation protocol: Yes   Plan:  Continue heparin gtt at 2750 units/hr Daily heparin level and CBC Monitor for signs of bleeding  Jonathon Bellows, PharmD Candidate 04/12/2019 10:44 AM

## 2019-04-12 NOTE — Evaluation (Signed)
Clinical/Bedside Swallow Evaluation Patient Details  Name: Michael Riggs MRN: [id number] Date of Birth: 08-10-1970  Today's Date: 04/12/2019 Time: SLP Start Time (ACUTE ONLY): 0940 SLP Stop Time (ACUTE ONLY): 0950 SLP Time Calculation (min) (ACUTE ONLY): 10 min  Past Medical History:  Past Medical History:  Diagnosis Date  . Anemia   . Depression   . Diabetes mellitus with peripheral vascular disease ([place])   . Dyslipidemia   . Dyspnea   . ESRD (end stage renal disease) on dialysis ([place]) 01/2019   MWF  . GERD (gastroesophageal reflux disease)   . Hypertension   . Physical deconditioning   . Syphilis 01/2019   Past Surgical History:  Past Surgical History:  Procedure Laterality Date  . AV FISTULA PLACEMENT Left 01/09/2019   Procedure: ARTERIOVENOUS (AV) FISTULA CREATION LEFT UPPER ARM;  Surgeon: [name], MD;  Location: [facility] OR;  Service: Vascular;  Laterality: Left;  . below the knee amputation Left   . EYE SURGERY    . IR FLUORO GUIDE CV LINE RIGHT  01/31/2019  . IR FLUORO GUIDE CV LINE RIGHT  02/03/2019  . IR US GUIDE VASC ACCESS RIGHT  01/31/2019  . IR US GUIDE VASC ACCESS RIGHT  02/03/2019   HPI:  Patient is a 48 year old male with a complex recent medical history essentially admitted 3 months ago almost continuous stay in the hospital with complications related to renal failure initiation of dialysis. Intubated from 9/21 to 9/22.    Assessment / Plan / Recommendation Clinical Impression  Pt demonstrates impaired cognition which make subjective assessment difficult; pt has been hacking and coughing all morning, to the point where it seems perseverative. Hi voice was clear and he was very briefly intubated and is relatively young, so there are few clinical indicators for aspiration. Regardless pt has immediate and delayed coughing after all bites and sips, with occasional expectoration of puree or water. The more PO that is given, the more pts coughs. He also endorses  very sore throat. He is currently on CRRT and was just extubated yesterday. Pt may have meds in puree and bites of ice today. SLP will f/u tomorrow to hopefully advance diet if pt baseline coughing has improved and better subjective results are available. If not, and pt continues on CRRT, FEES may be needed.  SLP Visit Diagnosis: Dysphagia, oropharyngeal phase (R13.12)    Aspiration Risk  Mild aspiration risk    Diet Recommendation NPO;NPO except meds;Ice chips PRN after oral care   Medication Administration: Whole meds with puree    Other  Recommendations     Follow up Recommendations Skilled Nursing facility      Frequency and Duration min 2x/week  2 weeks       Prognosis Prognosis for Safe Diet Advancement: Good      Swallow Study   General HPI: Patient is a 48 year old male with a complex recent medical history essentially admitted 3 months ago almost continuous stay in the hospital with complications related to renal failure initiation of dialysis. Intubated from 9/21 to 9/22.  Type of Study: Bedside Swallow Evaluation Diet Prior to this Study: NPO Temperature Spikes Noted: No Respiratory Status: Room air History of Recent Intubation: No Behavior/Cognition: Alert;Cooperative;Confused Oral Cavity Assessment: Within Functional Limits Oral Care Completed by SLP: No Oral Cavity - Dentition: Adequate natural dentition Vision: Functional for self-feeding Self-Feeding Abilities: Able to feed self Patient Positioning: Upright in bed Baseline Vocal Quality: Normal Volitional Cough: Strong Volitional Swallow: Able to elicit  Oral/Motor/Sensory Function Overall Oral Motor/Sensory Function: Within functional limits   Ice Chips Ice chips: Not tested   Thin Liquid Thin Liquid: Impaired Presentation: Straw Oral Phase Functional Implications: Left anterior spillage;Right anterior spillage Pharyngeal  Phase Impairments: Cough - Delayed;Cough - Immediate;Throat Clearing -  Immediate;Throat Clearing - Delayed    Nectar Thick Nectar Thick Liquid: Not tested   Honey Thick Honey Thick Liquid: Not tested   Puree Puree: Impaired Pharyngeal Phase Impairments: Cough - Immediate   Solid     Solid: Not tested     Herbie [place], MA CCC-SLP  Acute Rehabilitation Services Pager [phone] Office [phone]  Lynann Beaver 04/12/2019,10:44 AM

## 2019-04-12 NOTE — Progress Notes (Signed)
NAME:  Michael Riggs, MRN:  [id number], DOB:  [birth year], LOS: 17 ADMISSION DATE:  03/28/2019, CONSULTATION DATE: 04/10/2019 REFERRING MD: [facility] hospitalist, CHIEF COMPLAINT: Fever hypotension arrest  Brief History   Patient is a 48 year old male with a complex recent medical history essentially admitted 3 months ago almost continuous stay in the hospital with complications related to renal failure initiation of dialysis.  History of present illness   Patient is a 48 year old male admitted in late July end-stage renal disease and started on dialysis at that time.  Time in early August he developed some blistering on his right leg.  Over the last 2 months the right lower extremity has become more necrotic felt secondary to a combination of chronic infection, diabetes mellitus, peripheral vascular disease.  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  He was discharged to rehab for further physical therapy with severe deconditioning and intermittent confusion felt secondary to uremia sometime in late August.  He was readmitted earlier this month with worsening in the right lower extremity wound infection.  Earlier this evening he became progressively delirious, spiked a temperature to 103 with sudden hypotension and complete unresponsiveness.  He was resuscitated intubated moved to the ICU where I am seeing him.  At the time of my evaluation the patient is on phenylephrine with a systolic blood pressure of 80.  He only has peripheral IV access at this time.  The etiology of his fever and acute decompensation is not entirely clear.  Due to worsening hypotension will be unable to get a CT scan of the chest to rule out the possibility of pulmonary embolus.  We are in the process of culturing him and resuming vancomycin and Zosyn empirically.  We will also start full dose heparinization for possible pulmonary embolus.  If more stable in the morning we can confirm her disavow a PE at that time.  If there is clinical  situation will deteriorate through the night.  Past Medical History   Past Medical History:  Diagnosis Date  . Anemia   . Chronic kidney disease    Stage 5  . Depression   . Diabetes mellitus without complication ([place])   . Dyspnea   . GERD (gastroesophageal reflux disease)   . Hypertension    Patient Active Problem List   Diagnosis Date Noted  . Pruritus   . Scrotal pain   . Labile blood pressure   . Scrotal edema   . Status post below-knee amputation of left lower extremity ([place])   . Slow transit constipation   . Leukocytosis   . Sleep disturbance   . Essential hypertension   . Anemia of chronic disease   . Controlled type 2 diabetes mellitus with hyperglycemia, without long-term current use of insulin ([place])   . ESRD on dialysis ([place])   . Urinary retention   . Debility 02/09/2019  . Fluid overload 02/04/2019  . Hyperkalemia 01/30/2019  . ARF (acute renal failure) ([place]) 01/30/2019  . Hypokalemia 01/30/2019  . Hypoglycemia 01/30/2019  . Syphilis 01/30/2019  . Macrocytic anemia 01/30/2019  . End stage renal disease ([place])   . PDR (proliferative diabetic retinopathy) ([place]) 12/14/2013  . Venous hypertension 09/22/2013  . Diabetes mellitus type 2 in obese ([place]) 07/04/2013  . Habitual alcohol use 07/04/2013  . Obesity, Class II, BMI 35-39.9, with comorbidity 07/04/2013     Significant Hospital Events   Resuscitation and admission to the ICU 04/10/2019  Consults:  Vascular Nephrology  Procedures:  NA  Significant  Diagnostic Tests:  NA  Micro Data:  04/08/2002 blood cultures x2>> 04/09/2019 sputum>>  Antimicrobials:  04/10/2019 vancomycin>> 04/09/2019 Zosyn>>  Interim history/subjective:  Remains extubated.  Complains of pain with these extensive vascular necrosis of lower extremity  Objective   Blood pressure 92/61, pulse 68, temperature 98.8 F (37.1 C), temperature source Oral, resp. rate 15, height 5\' 10"  (1.778 m), weight 111.5 kg,  SpO2 98 %.        Intake/Output Summary (Last 24 hours) at 04/12/2019 0939 Last data filed at 04/12/2019 0600 Gross per 24 hour  Intake 1279.26 ml  Output 125 ml  Net 1154.26 ml   Filed Weights   04/10/19 0105 04/10/19 0900 04/10/19 1215  Weight: 111.5 kg 111.5 kg 111.5 kg    Examination: General: Obese male who is awake and alert HEENT: Short neck no JVD lymphadenopathy is appreciated.  Left internal jugular CVL in place right tunneled HD catheter in place Neuro: Follows commands complains of significant pain in the lower extremity CV: Heart sounds are regular regular rate and rhythm PULM: Decreased breath sounds throughout.  Noted to have gurgling at times GI: Obese soft positive bowel sounds Extremities: Left lower extremity with BKA noted to have vascular insufficiency wounds on the left and in the right lower extremity and ri ght thigh Skin: No pictures         Resolved Hospital Problem list   NA  Assessment & Plan:  Ventilator dependent respiratory failure intubated 0 300 04/10/2019 due to cardiac arrest and inability to protect airway. Extubated 04/11/2019 Follow-up chest x-ray Pulmonary toilet Unfortunately due to the pain he suffers from his vascular disease he requires narcotics with stents leads to respiratory distress.  Suspect he will be continuously in that cycle of pain medicines intubation and extubation. Suggest palliative care consult   Status post cardiac arrest 04/09/99 with CPR performed for 6 minutes no defibrillation no drugs.  Questionable etiology.  Currently on heparin drip for suspected PE. Vasopressor support Awake and alert ED echo was performed on 04/11/2019 demonstrates an EF 55 to 60%.  Right ventricular size mildly enlarged. With chronic kidney disease CT PE protocol not recommended Therefore continue heparin for now can consider VQ scan Lower extremity Doppler studies remain pending   Chronic kidney disease with left AV graft  maturing, right tunneled catheter in place.  Questionable she will need CRRT or hemodialysis he does have a low blood pressure at this time. Lab Results  Component Value Date   CREATININE 6.02 (H) 04/12/2019   CREATININE 4.54 (H) 04/11/2019   CREATININE 5.70 (H) 04/10/2019   Recent Labs  Lab 04/10/19 0518 04/11/19 0500 04/12/19 0522  K 4.4 3.4* 3.7  Potassium is adequate Per renal Left bicep AV graft is non-maturing  .  Questionable sepsis currently on empirical antimicrobial therapy Currently off pressor support Monitor culture data, no growth to date Currently on vancomycin and Zosyn with multiple antibiotics in the past   Peripheral vascular disease status post left AKA chronic changes to right lower extremity. Calciphylaxis with wounds Multiple wounds on right leg and left leg that has a BKA Wounds are poorly healing Is being followed by vascular surgery Suggest palliative care consult  Suspect the cycle of being intubated extubated treated with pain medications and then reintubated will continue.  May be time to consider palliative care with goal of care being comfort.   Best practice:  Diet: N.p.o./nutrition consult, swallow evaluation pending Pain/Anxiety/Delirium protocol (if indicated): Fentanyl PRN VAP protocol (if indicated):  Yes DVT prophylaxis: Full dose heparin GI prophylaxis: Yes Glucose control: CBG Mobility: Bedrest Code Status: Full Family Communication: 04/12/2019 updated patient at bedside Disposition: ICU  Labs   CBC: Recent Labs  Lab 04/10/19 0007 04/10/19 0340 04/10/19 0518 04/10/19 0953 04/11/19 0500 04/12/19 0549  WBC 35.1*  --  33.0* 29.9* 26.3* 25.2*  NEUTROABS  --   --   --   --   --  20.0*  HGB 8.8* 9.9* 8.5* 8.6* 7.8* 7.6*  HCT 29.0* 29.0* 28.1* 27.7* 25.9* 27.1*  MCV 92.4  --  92.4 91.1 93.2 97.1  PLT 333  --  300 292 279 [id number]    Basic Metabolic Panel: Recent Labs  Lab 04/08/19 0932 04/09/19 0802 04/10/19 0007  04/10/19 0340 04/10/19 0518 04/11/19 0500 04/11/19 1001 04/12/19 0522  NA 134* 131* 130* 137 135 134*  --  135  K 4.2 3.9 4.3 4.5 4.4 3.4*  --  3.7  CL 96* 94* 95*  --  103 102  --  101  CO2 25 24 23   --  20* 24  --  22  GLUCOSE 99 116* 130*  --  152* 95  --  77  BUN 20 25* 38*  --  39* 28*  --  36*  CREATININE 4.02* 4.10* 5.54*  --  5.70* 4.54*  --  6.02*  CALCIUM 8.6* 8.3* 8.2*  --  8.3* 7.7*  --  8.1*  MG 2.0 1.8 1.7  --   --   --  1.8 1.8  PHOS 3.4 3.7 3.4  --  4.4 3.5  --  5.3*   GFR: Estimated Creatinine Clearance: 19 mL/min (A) (by C-G formula based on SCr of 6.02 mg/dL (H)). Recent Labs  Lab 04/10/19 0007 04/10/19 0309 04/10/19 0518 04/10/19 0953 04/11/19 0500 04/12/19 0549  WBC 35.1*  --  33.0* 29.9* 26.3* 25.2*  LATICACIDVEN 1.6 1.7  --   --   --   --     Liver Function Tests: Recent Labs  Lab 04/10/19 0007 04/10/19 0518 04/11/19 0500 04/11/19 1001 04/12/19 0522  AST  --   --   --  22  --   ALT  --   --   --  15  --   ALKPHOS  --   --   --  84  --   BILITOT  --   --   --  0.4  --   PROT  --   --   --  7.3  --   ALBUMIN 1.8* 1.7* 1.5* 1.6* 1.6*   No results for input(s): LIPASE, AMYLASE in the last 168 hours. No results for input(s): AMMONIA in the last 168 hours.  ABG    Component Value Date/Time   PHART 7.415 04/10/2019 0340   PCO2ART 37.4 04/10/2019 0340   PO2ART 257.0 (H) 04/10/2019 0340   HCO3 24.0 04/10/2019 0340   TCO2 25 04/10/2019 0340   ACIDBASEDEF 2.8 (H) 04/10/2019 0045   O2SAT 100.0 04/10/2019 0340     Coagulation Profile: Recent Labs  Lab 04/10/19 0309 04/11/19 0500 04/12/19 0522  INR 1.6* 1.5* 1.5*    Cardiac Enzymes: No results for input(s): CKTOTAL, CKMB, CKMBINDEX, TROPONINI in the last 168 hours.  HbA1C: Hgb A1c MFr Bld  Date/Time Value Ref Range Status  01/31/2019 11:18 AM 5.0 4.8 - 5.6 % Final    Comment:    (NOTE) Pre diabetes:          5.7%-6.4% Diabetes:              >  6.4% Glycemic control for    <7.0% adults with diabetes     CBG: Recent Labs  Lab 04/11/19 2116 04/11/19 2148 04/11/19 2353 04/12/19 0347 04/12/19 0823  GLUCAP 69* 106* 78 78 61*      App cct 36 min      Richardson Landry Pooja Camuso ACNP [facility] Pager [phone] till 1 pm If no answer page [phone] 04/12/2019, 9:39 AM

## 2019-04-13 ENCOUNTER — Inpatient Hospital Stay (HOSPITAL_COMMUNITY)
Admit: 2019-04-13 | Discharge: 2019-04-13 | Disposition: A | Discharge status: Home / Self Care | Payer: Medicare Other | Attending: Internal Medicine | Admitting: Internal Medicine

## 2019-04-13 ENCOUNTER — Inpatient Hospital Stay (HOSPITAL_COMMUNITY): Payer: Medicare Other

## 2019-04-13 DIAGNOSIS — R4182 Altered mental status, unspecified: Secondary | ICD-10-CM

## 2019-04-13 LAB — RENAL FUNCTION PANEL
Albumin: 1.6 g/dL — ABNORMAL LOW (ref 3.5–5.0)
Albumin: 1.6 g/dL — ABNORMAL LOW (ref 3.5–5.0)
Albumin: 1.6 g/dL — ABNORMAL LOW (ref 3.5–5.0)
Anion gap: 13 (ref 5–15)
Anion gap: 16 — ABNORMAL HIGH (ref 5–15)
Anion gap: 17 — ABNORMAL HIGH (ref 5–15)
BUN: 15 mg/dL (ref 6–20)
BUN: 18 mg/dL (ref 6–20)
BUN: 23 mg/dL — ABNORMAL HIGH (ref 6–20)
CO2: 24 mmol/L (ref 22–32)
CO2: 21 mmol/L — ABNORMAL LOW (ref 22–32)
CO2: 20 mmol/L — ABNORMAL LOW (ref 22–32)
Calcium: 7.8 mg/dL — ABNORMAL LOW (ref 8.9–10.3)
Calcium: 8.1 mg/dL — ABNORMAL LOW (ref 8.9–10.3)
Calcium: 8.3 mg/dL — ABNORMAL LOW (ref 8.9–10.3)
Chloride: 99 mmol/L (ref 98–111)
Chloride: 98 mmol/L (ref 98–111)
Chloride: 97 mmol/L — ABNORMAL LOW (ref 98–111)
Creatinine, Ser: 3.46 mg/dL — ABNORMAL HIGH (ref 0.61–1.24)
Creatinine, Ser: 4.44 mg/dL — ABNORMAL HIGH (ref 0.61–1.24)
Creatinine, Ser: 5.52 mg/dL — ABNORMAL HIGH (ref 0.61–1.24)
GFR calc Af Amer: 23 mL/min — ABNORMAL LOW (ref >60)
GFR calc Af Amer: 17 mL/min — ABNORMAL LOW (ref >60)
GFR calc Af Amer: 13 mL/min — ABNORMAL LOW (ref >60)
GFR calc non Af Amer: 20 mL/min — ABNORMAL LOW (ref >60)
GFR calc non Af Amer: 15 mL/min — ABNORMAL LOW (ref >60)
GFR calc non Af Amer: 11 mL/min — ABNORMAL LOW (ref >60)
Glucose, Bld: 80 mg/dL (ref 70–99)
Glucose, Bld: 73 mg/dL (ref 70–99)
Glucose, Bld: 74 mg/dL (ref 70–99)
Phosphorus: 2.3 mg/dL — ABNORMAL LOW (ref 2.5–4.6)
Phosphorus: 2.9 mg/dL (ref 2.5–4.6)
Phosphorus: 3.8 mg/dL (ref 2.5–4.6)
Potassium: 3.2 mmol/L — ABNORMAL LOW (ref 3.5–5.1)
Potassium: 3.3 mmol/L — ABNORMAL LOW (ref 3.5–5.1)
Potassium: 3.3 mmol/L — ABNORMAL LOW (ref 3.5–5.1)
Sodium: 136 mmol/L (ref 135–145)
Sodium: 135 mmol/L (ref 135–145)
Sodium: 134 mmol/L — ABNORMAL LOW (ref 135–145)

## 2019-04-13 LAB — BLOOD GAS, ARTERIAL
Acid-base deficit: 3.1 mmol/L — ABNORMAL HIGH (ref 0.0–2.0)
Bicarbonate: 21.1 mmol/L (ref 20.0–28.0)
FIO2: 0.21
O2 Saturation: 84.5 %
Patient temperature: 98.6
pCO2 arterial: 35.7 mmHg (ref 32.0–48.0)
pH, Arterial: 7.388 (ref 7.350–7.450)
pO2, Arterial: 55.2 mmHg — ABNORMAL LOW (ref 83.0–108.0)

## 2019-04-13 LAB — CBC
HCT: 24.7 % — ABNORMAL LOW (ref 39.0–52.0)
HCT: 25.6 % — ABNORMAL LOW (ref 39.0–52.0)
Hemoglobin: 7.6 g/dL — ABNORMAL LOW (ref 13.0–17.0)
Hemoglobin: 7.8 g/dL — ABNORMAL LOW (ref 13.0–17.0)
MCH: 28 pg (ref 26.0–34.0)
MCH: 27.9 pg (ref 26.0–34.0)
MCHC: 30.8 g/dL (ref 30.0–36.0)
MCHC: 30.5 g/dL (ref 30.0–36.0)
MCV: 91.1 fL (ref 80.0–100.0)
MCV: 91.4 fL (ref 80.0–100.0)
Platelets: 304 10*3/uL (ref 150–400)
Platelets: 311 10*3/uL (ref 150–400)
RBC: 2.71 MIL/uL — ABNORMAL LOW (ref 4.22–5.81)
RBC: 2.8 MIL/uL — ABNORMAL LOW (ref 4.22–5.81)
RDW: 16.8 % — ABNORMAL HIGH (ref 11.5–15.5)
RDW: 17.4 % — ABNORMAL HIGH (ref 11.5–15.5)
WBC: 20.1 10*3/uL — ABNORMAL HIGH (ref 4.0–10.5)
WBC: 17.8 10*3/uL — ABNORMAL HIGH (ref 4.0–10.5)
nRBC: 0.1 % (ref 0.0–0.2)
nRBC: 0 % (ref 0.0–0.2)

## 2019-04-13 LAB — PROTIME-INR
INR: 1.6 — ABNORMAL HIGH (ref 0.8–1.2)
Prothrombin Time: 19.2 seconds — ABNORMAL HIGH (ref 11.4–15.2)

## 2019-04-13 LAB — HEPARIN LEVEL (UNFRACTIONATED): Heparin Unfractionated: 0.34 IU/mL (ref 0.30–0.70)

## 2019-04-13 LAB — GLUCOSE, CAPILLARY: Glucose-Capillary: 70 mg/dL (ref 70–99)

## 2019-04-13 LAB — PROCALCITONIN: Procalcitonin: 25.13 ng/mL

## 2019-04-13 IMAGING — DX DG CHEST 1V PORT
1 series · 1 of 1 positions shown · non-contrast
Comparison: Radiographs [DATE].

CLINICAL DATA: Hypoxemia, shortness of breath.

EXAM:
PORTABLE CHEST 1 VIEW

[chest ap]
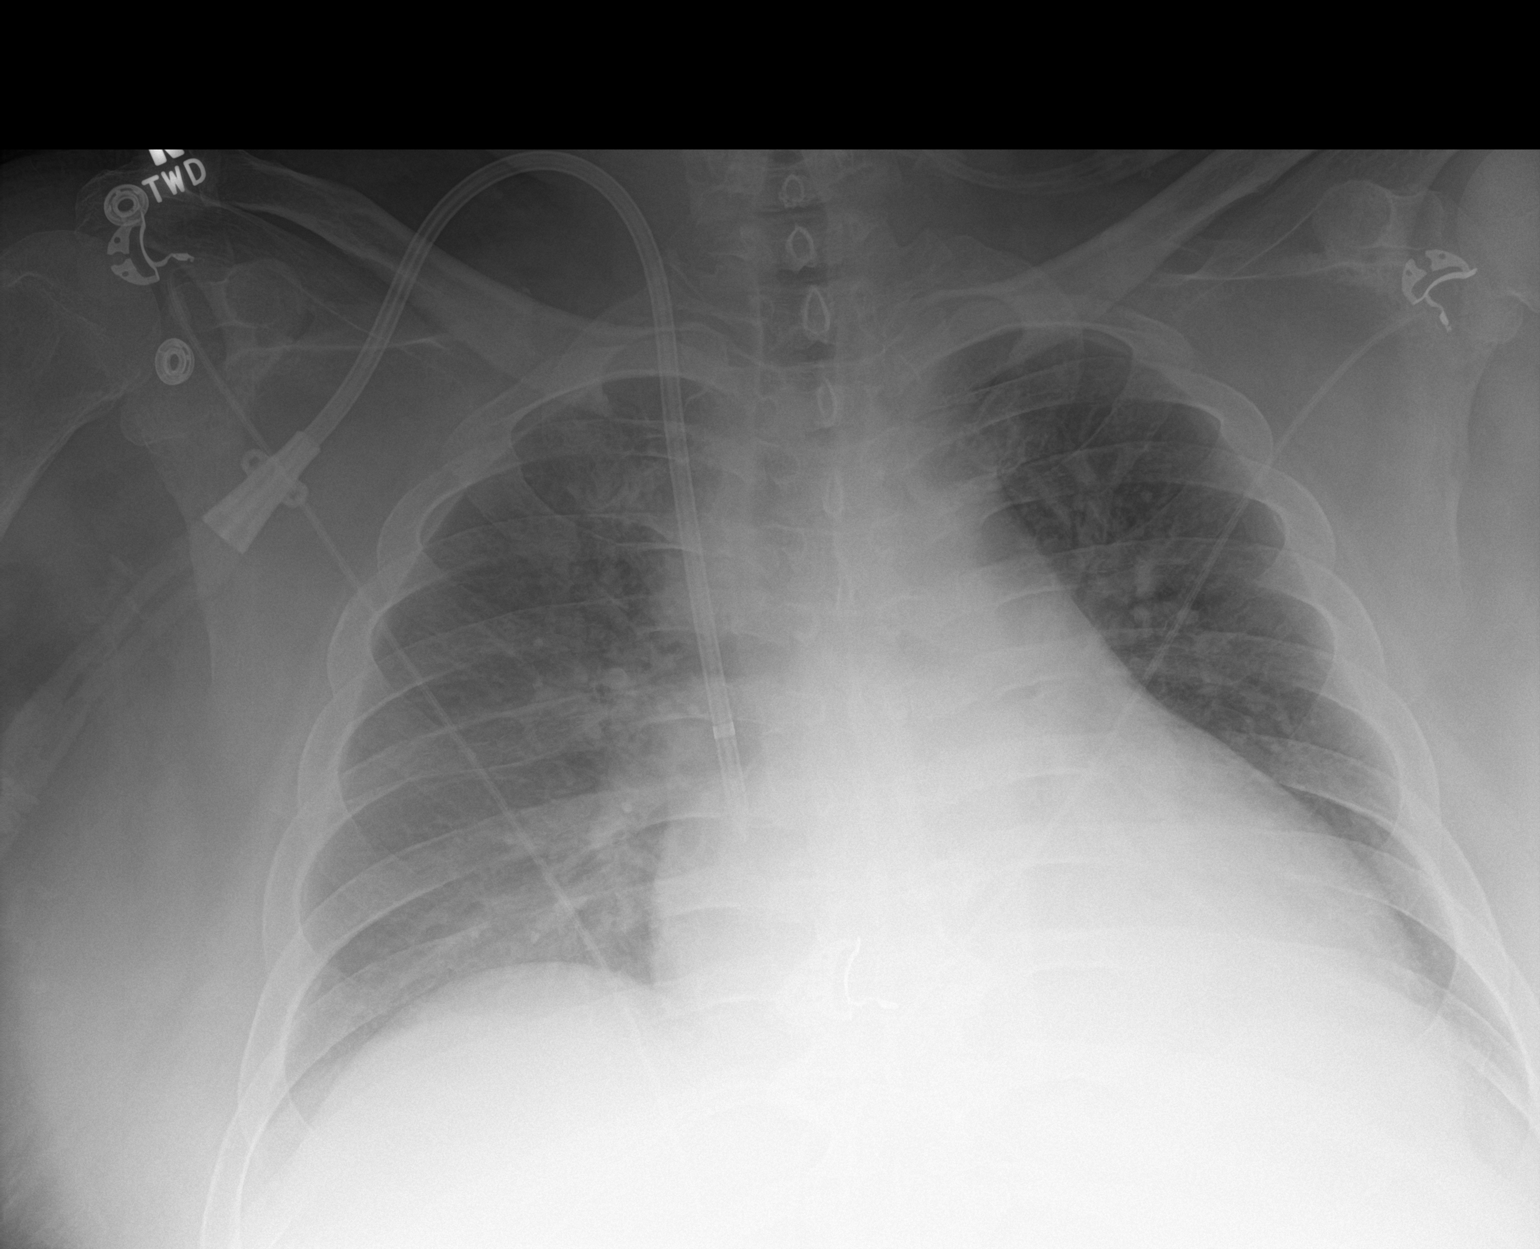

[1 of 1 positions shown; findings below may reference images not displayed]

FINDINGS: Stable cardiomegaly. No pneumothorax or pleural effusion is noted.
Minimal bibasilar subsegmental atelectasis is noted. Right internal
jugular dialysis catheter is unchanged in position. Bony thorax is
unremarkable.
IMPRESSION: Minimal bibasilar subsegmental atelectasis.

## 2019-04-13 MED ORDER — RENA-VITE PO TABS
1.0000 | ORAL_TABLET | Freq: Every day | ORAL | Status: DC
  Administered 2019-04-13 – 2019-04-26 (×14): 1 via ORAL
  Filled 2019-04-13 (×14): qty 1

## 2019-04-13 MED ORDER — LIDOCAINE-PRILOCAINE 2.5-2.5 % EX CREA
1.0000 "application " | TOPICAL_CREAM | CUTANEOUS | Status: DC | PRN
  Filled 2019-04-13: qty 5

## 2019-04-13 MED ORDER — PENTAFLUOROPROP-TETRAFLUOROETH EX AERO: 1.0000 "application " | INHALATION_SPRAY | CUTANEOUS | Status: DC | PRN

## 2019-04-13 MED ORDER — ALTEPLASE 2 MG IJ SOLR: 2.0000 mg | Freq: Once | INTRAMUSCULAR | Status: DC | PRN

## 2019-04-13 MED ORDER — NEPRO/CARBSTEADY PO LIQD: 237.0000 mL | Freq: Three times a day (TID) | ORAL | Status: DC

## 2019-04-13 MED ORDER — HEPARIN SODIUM (PORCINE) 1000 UNIT/ML DIALYSIS
1000.0000 [IU] | INTRAMUSCULAR | Status: DC | PRN
  Filled 2019-04-13: qty 1

## 2019-04-13 MED ORDER — GABAPENTIN 100 MG PO CAPS: 100.0000 mg | ORAL_CAPSULE | Freq: Every day | ORAL | Status: DC

## 2019-04-13 MED ORDER — HEPARIN SODIUM (PORCINE) 1000 UNIT/ML DIALYSIS
4000.0000 [IU] | Freq: Once | INTRAMUSCULAR | Status: DC
  Filled 2019-04-13: qty 4

## 2019-04-13 MED ORDER — SODIUM CHLORIDE 0.9 % IV SOLN: 100.0000 mL | INTRAVENOUS | Status: DC | PRN

## 2019-04-13 MED ORDER — NEPRO/CARBSTEADY PO LIQD
237.0000 mL | Freq: Two times a day (BID) | ORAL | Status: DC
  Administered 2019-04-15 – 2019-04-25 (×11): 237 mL via ORAL
  Filled 2019-04-13: qty 237

## 2019-04-13 MED ORDER — HEPARIN SODIUM (PORCINE) 5000 UNIT/ML IJ SOLN
5000.0000 [IU] | Freq: Three times a day (TID) | INTRAMUSCULAR | Status: DC
  Administered 2019-04-13 – 2019-04-27 (×35): 5000 [IU] via SUBCUTANEOUS
  Filled 2019-04-13 (×36): qty 1

## 2019-04-13 MED ORDER — PRO-STAT SUGAR FREE PO LIQD
30.0000 mL | Freq: Two times a day (BID) | ORAL | Status: DC
  Administered 2019-04-13 – 2019-04-26 (×26): 30 mL via ORAL
  Filled 2019-04-13 (×27): qty 30

## 2019-04-13 MED ORDER — LIDOCAINE HCL (PF) 1 % IJ SOLN: 5.0000 mL | INTRAMUSCULAR | Status: DC | PRN

## 2019-04-13 MED ORDER — KETOROLAC TROMETHAMINE 15 MG/ML IJ SOLN: 15.0000 mg | Freq: Three times a day (TID) | INTRAMUSCULAR | Status: DC | PRN

## 2019-04-13 MED ORDER — CHLORHEXIDINE GLUCONATE CLOTH 2 % EX PADS
6.0000 | MEDICATED_PAD | Freq: Every day | CUTANEOUS | Status: DC
  Administered 2019-04-13 – 2019-04-16 (×4): 6 via TOPICAL

## 2019-04-13 NOTE — Progress Notes (Signed)
Nutrition Follow-up  DOCUMENTATION CODES:   Morbid obesity  INTERVENTION:   -D/C B complex with vitamin C  -D/C liquid MVI   Add Renal MVI daily  Add Nepro Shake po BID, each supplement provides 425 kcal and 19 grams protein  Add 30 ml Prostat BID, each supplement provides 100 kcals and 15 grams protein.   NUTRITION DIAGNOSIS:   Increased nutrient needs related to wound healing as evidenced by increased estimated needs.  Ongoing  GOAL:   Provide needs based on ASPEN/SCCM guidelines  Diet advanced   MONITOR:   Vent status, Labs, Weight trends, Skin, I & O's  ASSESSMENT:   49 year old with history of ESRD on TTS HD, hypertension, hyperlipidemia sent from SNF for evaluation of fever, confusion right lower extremity pain concerning for infection.   9/21- cardiac arrest, intubated 9/22- extubated   Diet advanced to regular consistency with thin liquids this am. Mental status remains a barrier to adequate PO intake. No meal completions charted s/p extubation. RD to provide supplements to maximize kcal and protein. Cnsider Cortrak with initiation of enteral nutrition if PO intake remains poor given severity of wounds.   EDW: 120 kg   Current weight: 115 kg   I/O: +5,773 ml since 9/10 Last HD today: 1550 ml net UF Stool: 505 ml x 24 hrs   Medications: aranesp, liquid MVI Labs: K 3.3 (L) corrected calcium 10 (wdl)   Diet Order:   Diet Order            Diet renal/carb modified with fluid restriction Diet-HS Snack? Nothing; Fluid restriction: 1200 mL Fluid; Room service appropriate? Yes; Fluid consistency: Thin  Diet effective now             EDUCATION NEEDS:   No education needs have been identified at this time  Skin:  Skin Assessment: Reviewed RN Assessment Skin Integrity Issues:: Other (Comment) Other: R leg/R pretibial wound, L leg lesion (calciphylaxis), pressure injury penis  Last BM:  9/23  Height:   Ht Readings from Last 1 Encounters:   04/10/19 5\' 10"  (1.778 m)    Weight:   Wt Readings from Last 1 Encounters:  04/12/19 115 kg    Ideal Body Weight:  71 kg(Adjusted for L BKA)  BMI:  Body mass index is 36.38 kg/m.  Estimated Nutritional Needs:   Kcal:  2200-2400 kcal  Protein:  110-130 grams  Fluid:  1000 ml + UOP  Mariana Single RD, LDN Clinical Nutrition Pager # [phone]

## 2019-04-13 NOTE — Progress Notes (Signed)
EEG complete - results pending 

## 2019-04-13 NOTE — Progress Notes (Signed)
ABG results called Regalado, Belkys A, MD. Placed on 4lpm [place] SpO2 now 92%.

## 2019-04-13 NOTE — Progress Notes (Signed)
  Speech Language Pathology Treatment: Dysphagia  Patient Details Name: Michael Riggs MRN: [id number] DOB: October 05, 1970 Today's Date: 04/13/2019 Time: [id number] SLP Time Calculation (min) (ACUTE ONLY): 13 min  Assessment / Plan / Recommendation Clinical Impression  Pt alert, but seemingly confused on SLP arrival.  Tangential responses to social conversation noted.  RN describes waxing and waning mental status and level of alertness.  Consider Cognitive-Linguistic Evaluation should symptoms persist.  Pt tolerated all consistencies trialed with no clinical s/s of aspiration and exhibited good oral clearance of regular solids.  Pt tolerated serial straw sips of thin liquid.  Pt took multiple pills with thin liquid as well. There was no baseline cough or coughing with trials during today's session.  Pt did intermittently belch following PO trials. Recommend regular texture diet with thin liquid.    HPI HPI: Patient is a 48 year old male with a complex recent medical history essentially admitted 3 months ago almost continuous stay in the hospital with complications related to renal failure initiation of dialysis. Intubated from 9/21 to 9/22.       SLP Plan  Continue with current plan of care       Recommendations  Diet recommendations: Regular;Thin liquid Liquids provided via: Cup;Straw Medication Administration: Whole meds with liquid Supervision: Staff to assist with self feeding Compensations: Slow rate;Small sips/bites Postural Changes and/or Swallow Maneuvers: Seated upright 90 degrees                General recommendations: (Consider Cognitive-Linguistic Evaluation) Oral Care Recommendations: Oral care BID Follow up Recommendations: Skilled Nursing facility SLP Visit Diagnosis: Dysphagia, oropharyngeal phase (R13.12) Plan: Continue with current plan of care       [place], [place], [place] Office:  [phone] 04/13/2019, 11:23 AM

## 2019-04-13 NOTE — Progress Notes (Signed)
Pt arrived from [place] at 6pm. Pt lethargic, wakes to repeated verbal and physical stimuli. Pain with any movement. MASD to buttocks noted along with wounds to right lower extremity. Low loss air mattress ordered.

## 2019-04-13 NOTE — Plan of Care (Signed)
  Problem: Education: Goal: Knowledge of General Education information will improve Description Including pain rating scale, medication(s)/side effects and non-pharmacologic comfort measures Outcome: Progressing   

## 2019-04-13 NOTE — Progress Notes (Addendum)
NAME:  Michael Riggs, MRN:  [id number], DOB:  18-Apr-1971, LOS: 5 ADMISSION DATE:  03/28/2019, CONSULTATION DATE: 04/10/2019 REFERRING MD: [facility] hospitalist, CHIEF COMPLAINT: Fever hypotension arrest  Brief History   Patient is a 48 year old male with a complex recent medical history admitted 3 months ago with complications related to renal failure initiation of dialysis.  History of present illness   48 year old male admitted in late July end-stage renal disease and started on dialysis at that time. In early August, he developed some blistering on his right leg.  Over the last 2 months the right lower extremity has become more necrotic felt secondary to a combination of chronic infection, diabetes mellitus, peripheral vascular disease.  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  He was discharged to rehab for further physical therapy with severe deconditioning and intermittent confusion felt secondary to uremia sometime in late August.  He was readmitted earlier this month with worsening in the right lower extremity wound infection.  On 9/20, he became progressively delirious, spiked a temperature to 103 with sudden hypotension and complete unresponsiveness.  He was resuscitated intubated moved to the ICU. Broad spectrum abx: vancomycin and Zosyn empirically. Transferred out of ICU 9/23.  Today he is being transferred to prog after being found minimally responsive by primary team.  ABG obtained with normal pCo2.  O2: 55.  He was placed on 5 L [place].  On arrival to evaluate patient, he is alert and interactive, delirious.   Past Medical History   Past Medical History:  Diagnosis Date  . Anemia   . Chronic kidney disease    Stage 5  . Depression   . Diabetes mellitus without complication ([place])   . Dyspnea   . GERD (gastroesophageal reflux disease)   . Hypertension    Patient Active Problem List   Diagnosis Date Noted  . Pruritus   . Scrotal pain   . Labile blood pressure   . Scrotal  edema   . Status post below-knee amputation of left lower extremity ([place])   . Slow transit constipation   . Leukocytosis   . Sleep disturbance   . Essential hypertension   . Anemia of chronic disease   . Controlled type 2 diabetes mellitus with hyperglycemia, without long-term current use of insulin ([place])   . ESRD on dialysis ([place])   . Urinary retention   . Debility 02/09/2019  . Fluid overload 02/04/2019  . Hyperkalemia 01/30/2019  . ARF (acute renal failure) ([place]) 01/30/2019  . Hypokalemia 01/30/2019  . Hypoglycemia 01/30/2019  . Syphilis 01/30/2019  . Macrocytic anemia 01/30/2019  . End stage renal disease ([place])   . PDR (proliferative diabetic retinopathy) ([place]) 12/14/2013  . Venous hypertension 09/22/2013  . Diabetes mellitus type 2 in obese ([place]) 07/04/2013  . Habitual alcohol use 07/04/2013  . Obesity, Class II, BMI 35-39.9, with comorbidity 07/04/2013     Significant Hospital Events   Resuscitation and admission to the ICU 04/10/2019  Consults:  Vascular Nephrology  Procedures:  NA  Significant Diagnostic Tests:  NA  Micro Data:  04/08/2002 blood cultures x2>> 04/09/2019 sputum>>  Antimicrobials:  04/10/2019 vancomycin>> 04/09/2019 Zosyn>>  Interim history/subjective:  Hemodynamics stable, alert and interactive. Delirious.  Pale. Scattered rhonchi throughout  Objective   Blood pressure (!) 144/83, pulse 84, temperature 98.4 F (36.9 C), temperature source Oral, resp. rate 18, height 5\' 10"  (1.778 m), weight 115 kg, SpO2 98 %.        Intake/Output Summary (Last 24 hours) at  04/13/2019 1259 Last data filed at 04/13/2019 [id number] Gross per 24 hour  Intake 758.49 ml  Output 1580 ml  Net -821.51 ml   Filed Weights   04/12/19 1402 04/12/19 1748 04/12/19 2011  Weight: 116.5 kg 115 kg 115 kg    Examination: General: alert but delirious HEENT: No JVD  NT/[place] Neuro: Alert, follows commands, unable to appreciate focal deficits. CV: S1, S2, no  edema PULM: Lungs scattered rhonchi, symmetric expansion GI: Abdomen soft and non-distended Extremities: LLE BKA, vascular insufficiency wounds on the left and in the right lower extremity and right thigh  Resolved Hospital Problem list   NA  Assessment & Plan:  Acute respiratory insufficiency with 5 L [place] Suspected OSA Intubation 9/21-9/22 - CXR pending - Pulmonary hygiene with flutter and IS - SLP evaluated and no evidence of dysphagia - Recommend overnight SpO2 monitoring or am ABG once medically stable to evaluate for OSA and possible qualification of CPAP machine - Patient continues to require PRN narcotics and suspect he has underlying OSA. Unfortunately this will continue to lead to episodes of respiratory insufficiency and hypercarbia. D/c fentanyl patch, melatonin, and PRN fentanyl.  Reduce neurontin dose.  Use multimodal pain management. Recommend palliative care consult  Delirium - Multifactorial: medications, hospitalization/ICU stay - Continue to encourage appropriate sleep wake cycle - Limit narcotics as able. - Check Glucose, BG 73 this am  Status post cardiac arrest 04/09/99 with CPR performed for 6 minutes no defibrillation no drugs.   - Suspect respiratory etiology   ESRD Hypokalemia - Nephrology following  - Access: R Permcath - s/p HD 9/23,  Plan for HD 9/25 per Neph  Sepsis, unclear source  Leukocytosis, improving - Cx data unremarkable thus far: C Diff Negative,  - Procalcitonin level improving - Empiric abx: Vanc/Zosyn.  Continue.  - Hemodynamics stable  Peripheral vascular disease status post left AKA chronic changes to right lower extremity. Calciphylaxis with wounds (non bx proven) - Wound care and ortho following - Pain management, palliative care consult  Anemia - Goal Hgb > 7   Best practice:  Diet: Diet as tolerated if remains alert Pain/Anxiety/Delirium protocol (if indicated):  VAP protocol (if indicated): Yes DVT prophylaxis:  Heparin subq GI prophylaxis: Yes Glucose control: CBG Mobility: Bedrest Code Status: Full Family Communication:  Disposition: Prog status  Labs   CBC: Recent Labs  Lab 04/10/19 0953 04/11/19 0500 04/12/19 0549 04/12/19 1922 04/13/19 0520  WBC 29.9* 26.3* 25.2* 19.9* 20.1*  NEUTROABS  --   --  20.0*  --   --   HGB 8.6* 7.8* 7.6* 8.1* 7.6*  HCT 27.7* 25.9* 27.1* 26.7* 24.7*  MCV 91.1 93.2 97.1 93.0 91.1  PLT 292 279 307 [id number]    Basic Metabolic Panel: Recent Labs  Lab 04/08/19 0932 04/09/19 0802 04/10/19 0007  04/10/19 0518 04/11/19 0500 04/11/19 1001 04/12/19 0522 04/12/19 1922 04/13/19 0520  NA 134* 131* 130*   < > 135 134*  --  135 136 135  K 4.2 3.9 4.3   < > 4.4 3.4*  --  3.7 3.2* 3.3*  CL 96* 94* 95*  --  103 102  --  101 99 98  CO2 25 24 23   --  20* 24  --  22 24 21*  GLUCOSE 99 116* 130*  --  152* 95  --  77 80 73  BUN 20 25* 38*  --  39* 28*  --  36* 15 18  CREATININE 4.02* 4.10* 5.54*  --  5.70* 4.54*  --  6.02* 3.46* 4.44*  CALCIUM 8.6* 8.3* 8.2*  --  8.3* 7.7*  --  8.1* 7.8* 8.1*  MG 2.0 1.8 1.7  --   --   --  1.8 1.8  --   --   PHOS 3.4 3.7 3.4  --  4.4 3.5  --  5.3* 2.3* 2.9   < > = values in this interval not displayed.   GFR: Estimated Creatinine Clearance: 26.1 mL/min (A) (by C-G formula based on SCr of 4.44 mg/dL (H)). Recent Labs  Lab 04/10/19 0007 04/10/19 0309  04/11/19 0500 04/12/19 0549 04/12/19 0955 04/12/19 1922 04/13/19 0520  PROCALCITON  --   --   --   --   --  31.65  --  25.13  WBC 35.1*  --    < > 26.3* 25.2*  --  19.9* 20.1*  LATICACIDVEN 1.6 1.7  --   --   --   --   --   --    < > = values in this interval not displayed.    Liver Function Tests: Recent Labs  Lab 04/11/19 0500 04/11/19 1001 04/12/19 0522 04/12/19 1922 04/13/19 0520  AST  --  22  --   --   --   ALT  --  15  --   --   --   ALKPHOS  --  84  --   --   --   BILITOT  --  0.4  --   --   --   PROT  --  7.3  --   --   --   ALBUMIN 1.5* 1.6* 1.6* 1.6*  1.6*   No results for input(s): LIPASE, AMYLASE in the last 168 hours. No results for input(s): AMMONIA in the last 168 hours.  ABG    Component Value Date/Time   PHART 7.388 04/13/2019 1205   PCO2ART 35.7 04/13/2019 1205   PO2ART 55.2 (L) 04/13/2019 1205   HCO3 21.1 04/13/2019 1205   TCO2 25 04/10/2019 0340   ACIDBASEDEF 3.1 (H) 04/13/2019 1205   O2SAT 84.5 04/13/2019 1205     Coagulation Profile: Recent Labs  Lab 04/10/19 0309 04/11/19 0500 04/12/19 0522 04/13/19 0520  INR 1.6* 1.5* 1.5* 1.6*    Cardiac Enzymes: No results for input(s): CKTOTAL, CKMB, CKMBINDEX, TROPONINI in the last 168 hours.  HbA1C: Hgb A1c MFr Bld  Date/Time Value Ref Range Status  01/31/2019 11:18 AM 5.0 4.8 - 5.6 % Final    Comment:    (NOTE) Pre diabetes:          5.7%-6.4% Diabetes:              >6.4% Glycemic control for   <7.0% adults with diabetes     CBG: Recent Labs  Lab 04/11/19 2353 04/12/19 0347 04/12/19 0823 04/12/19 1231 04/12/19 1551  GLUCAP 78 78 61* 80 77    Paulita Fujita, ACNP [facility]

## 2019-04-13 NOTE — Progress Notes (Addendum)
PROGRESS NOTE    Michael Riggs  [id number] DOB: Dec 12, 1970 DOA: 03/28/2019 PCP: [name], MD   Brief Narrative: 48 year old with past medical history significant for end-stage renal disease on hemodialysis TTS, hypertension, hyperlipidemia who was sent from a skilled nursing facility for evaluation of fever, confusion and right lower extremity pain concerning for infection.  Over the last 2 months he had been admitted to the hospital for worsening renal function and fluid overload.  Patient was a started on dialysis and eventually sent to rehab.  This time he was admitted for sepsis secondary to right lower extremity cellulitis with concern for necrotic tissue.  Orthopedic and plastic surgery was consulted they recommend medical management and have Dr. Sharol Given follow once he returns from his vacation. Hospital course complicated by profound hypotension and shock with loss of consciousness and require intubation.  Patient received CPR, required intubation.  Transfer to the ICU.  He was a started on IV pressors subsequently weaned off IV pressors.  He was extubated on 04/11/2019.  Patient continues to have severe lower extremity pain, Dr. Sharol Given evaluated wound on 9/23, notice some improvement. Recommended medical management.   Patient continue to have a lot of pain. He received pain medication overnight and ativan. He is more lethargic this am and confuse. ABG was ordered and showed P02 at  50. Place on oxygen. CCM consulted again. I have ordered CPAP at HS. He wake up some after been place on oxygen. Transfer to step down unit. Will consult palliative care for goals of care.      Assessment & Plan:   Principal Problem:   Wound infection Active Problems:   Hypertension   ESRD (end stage renal disease) on dialysis ([place])   Dyslipidemia   Diabetes mellitus with peripheral vascular disease ([place])   Depression   Physical deconditioning   Calciphylaxis   1-Acute Hypoxic respiratory  failure; Patient lost consciousness on 9/21, became severely hypotensive.  [facility] was called and he was intubated and received CPR. Patient was extubated on 9/22. He was a started on IV heparin to cover for PE.  Echo with mild dilation of right ventricle. CTA negative for PE. Doppler lower extremity limited status, negative for DVT CT angio negative for PE.  Hypoxemia, worse today. IV fentanyl discontinue. Transfer to step down unit. Place on oxygen, mentation improved.  Place of CPAP at hs.  CCM recommending palliative care consult for goals of care, high risk deterioration. difficult situation with patient in pain, requiring pain medication and resp. status.   2-Acute metabolic encephalopathy: Patient loss of consciousness, might be related to hypotension versus pain medication. Worse today, worsening hypoxemia. Place on oxygen.   Shock,  Patient requiring IV pressors. Now off of pressors. 1 of 2 blood cultures positive for staph coagulase negative.  Patient was on IV antibiotics since 9/08---Case was discussed with ID and orthopedic. Plan to hold IV antibiotics.    Right lower extremity wound, likely calciphylaxis: He continued to have leukocytosis .  He continues to have lot of pain. Dr Sharol Given wants to continue with conservative management at this time. He notice some improvement of the calciphylaxis.  Patient continue to have pain. He gets lethargic and hypoventilation with IV pain medications due to underline OSA. Palliative care consulted.   Chronic normocytic anemia: Continue to monitor closely.  End-stage renal disease on hemodialysis. Per nephrology. Immature fistula getting dialysis through hemodialysis tunneled catheter.   Nutrition: pass swallow.  Diarrhea: C. difficile test -10 days ago.  He denies abdominal pain.  Peripheral vascular disease, status post left AKA.  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   Weight as of this encounter: 115 kg.   DVT prophylaxis: On heparin drip Code Status: Full code Family Communication: I was able to contact patient's mother through brother. I explain to her that Tia is very sick, due to his wound L:E and multiples medical problems. I recommended palliative care consult for goals of care.  Disposition Plan: Transfer to the stepdown unit Consultants:   Dr. Sharol Given  Nephrology  CCM  Procedures:   Echo: Normal ejection fraction mild dilated right ventricle  Antimicrobials:  Vancomycin and Zosyn  Subjective: He is more lethargic today.  He is more confuse.  Still having a lot of pain LE.   Objective: Vitals:   04/13/19 0613 04/13/19 0903 04/13/19 0915 04/13/19 1237  BP: 133/72 128/75    Pulse: 70 84 86   Resp: 20 18    Temp: 98.3 F (36.8 C) 99.3 F (37.4 C)    TempSrc:  Oral    SpO2:  (!) 86% 90% 92%  Weight:      Height:        Intake/Output Summary (Last 24 hours) at 04/13/2019 1244 Last data filed at 04/13/2019 [id number] Gross per 24 hour  Intake 758.49 ml  Output 1580 ml  Net -821.51 ml   Filed Weights   04/12/19 1402 04/12/19 1748 04/12/19 2011  Weight: 116.5 kg 115 kg 115 kg    Examination:  General exam: Lethargic Respiratory system: Decrease breath sounds Cardiovascular system: S 1, S 2  RRR Gastrointestinal system: BS present, soft, nt Central nervous system: sleepy, confuse Extremities: Dressing right lower extremity, left AKA. Skin: Necrotic skin of right lower extremity   Data Reviewed: I have personally reviewed following labs and imaging studies  CBC: Recent Labs  Lab 04/10/19 0953 04/11/19 0500 04/12/19 0549 04/12/19 1922 04/13/19 0520  WBC 29.9* 26.3* 25.2* 19.9* 20.1*  NEUTROABS  --   --  20.0*  --   --   HGB 8.6* 7.8* 7.6* 8.1* 7.6*  HCT 27.7* 25.9* 27.1* 26.7* 24.7*  MCV 91.1 93.2 97.1 93.0 91.1  PLT 292 279 307 285 [id number]   Basic Metabolic Panel: Recent Labs  Lab 04/08/19 0932 04/09/19 0802 04/10/19 0007  04/10/19 0518 04/11/19 0500 04/11/19 1001 04/12/19 0522 04/12/19 1922 04/13/19 0520  NA 134* 131* 130*   < > 135 134*  --  135 136 135  K 4.2 3.9 4.3   < > 4.4 3.4*  --  3.7 3.2* 3.3*  CL 96* 94* 95*  --  103 102  --  101 99 98  CO2 25 24 23   --  20* 24  --  22 24 21*  GLUCOSE 99 116* 130*  --  152*  95  --  77 80 73  BUN 20 25* 38*  --  39* 28*  --  36* 15 18  CREATININE 4.02* 4.10* 5.54*  --  5.70* 4.54*  --  6.02* 3.46* 4.44*  CALCIUM 8.6* 8.3* 8.2*  --  8.3* 7.7*  --  8.1* 7.8* 8.1*  MG 2.0 1.8 1.7  --   --   --  1.8 1.8  --   --   PHOS 3.4 3.7 3.4  --  4.4 3.5  --  5.3* 2.3* 2.9   < > = values in this interval not displayed.   GFR: Estimated Creatinine Clearance: 26.1 mL/min (A) (by C-G formula based on SCr of 4.44 mg/dL (H)). Liver Function Tests: Recent Labs  Lab 04/11/19 0500 04/11/19 1001 04/12/19 0522 04/12/19 1922 04/13/19 0520  AST  --  22  --   --   --   ALT  --  15  --   --   --   ALKPHOS  --  84  --   --   --   BILITOT  --  0.4  --   --   --   PROT  --  7.3  --   --   --   ALBUMIN 1.5* 1.6* 1.6* 1.6* 1.6*   No results for input(s): LIPASE, AMYLASE in the last 168 hours. No results for input(s): AMMONIA in the last 168 hours. Coagulation Profile: Recent Labs  Lab 04/10/19 0309 04/11/19 0500 04/12/19 0522  04/13/19 0520  INR 1.6* 1.5* 1.5* 1.6*   Cardiac Enzymes: No results for input(s): CKTOTAL, CKMB, CKMBINDEX, TROPONINI in the last 168 hours. BNP (last 3 results) No results for input(s): PROBNP in the last 8760 hours. HbA1C: No results for input(s): HGBA1C in the last 72 hours. CBG: Recent Labs  Lab 04/11/19 2353 04/12/19 0347 04/12/19 0823 04/12/19 1231 04/12/19 1551  GLUCAP 78 78 61* 80 77   Lipid Profile: No results for input(s): CHOL, HDL, LDLCALC, TRIG, CHOLHDL, LDLDIRECT in the last 72 hours. Thyroid Function Tests: No results for input(s): TSH, T4TOTAL, FREET4, T3FREE, THYROIDAB in the last 72 hours. Anemia Panel: No results for input(s): VITAMINB12, FOLATE, FERRITIN, TIBC, IRON, RETICCTPCT in the last 72 hours. Sepsis Labs: Recent Labs  Lab 04/10/19 0007 04/10/19 0309 04/12/19 0955 04/13/19 0520  PROCALCITON  --   --  31.65 25.13  LATICACIDVEN 1.6 1.7  --   --     Recent Results (from the past 240 hour(s))  SARS CORONAVIRUS 2 (TAT 6-24 HRS) Nasopharyngeal Nasopharyngeal Swab     Status: None   Collection Time: 04/09/19  3:15 PM   Specimen: Nasopharyngeal Swab  Result Value Ref Range Status   SARS Coronavirus 2 NEGATIVE NEGATIVE Final    Comment: (NOTE) SARS-CoV-2 target nucleic acids are NOT DETECTED. The SARS-CoV-2 RNA is generally detectable in upper and lower respiratory specimens during the acute phase of infection. Negative results do not preclude SARS-CoV-2 infection, do not rule out co-infections with other pathogens, and should not be used as the sole basis for treatment or other patient management decisions. Negative results must be combined with clinical observations, patient history, and epidemiological information. The expected result is Negative. Fact Sheet for Patients: SugarRoll.be Fact Sheet for Healthcare Providers: https://www.woods-mathews.com/ This test is not yet approved or cleared by the  [place] FDA and  has been authorized for detection and/or diagnosis of SARS-CoV-2 by FDA under an Emergency Use Authorization (EUA). This EUA will remain  in effect (  meaning this test can be used) for the duration of the COVID-19 declaration under Section 56 4(b)(1) of the Act, 21 U.S.C. section 360bbb-3(b)(1), unless the authorization is terminated or revoked sooner. Performed at [facility], [place] [address], [place], [place] [address]   MRSA PCR Screening     Status: None   Collection Time: 04/10/19  1:48 AM   Specimen: Nasal Mucosa; Nasopharyngeal  Result Value Ref Range Status   MRSA by PCR NEGATIVE NEGATIVE Final    Comment:        The GeneXpert MRSA Assay (FDA approved for NASAL specimens only), is one component of a comprehensive MRSA colonization surveillance program. It is not intended to diagnose MRSA infection nor to guide or monitor treatment for MRSA infections. Performed at [facility], [place] [address] [place], [place] [address]   Culture, blood (Routine X 2) w Reflex to ID Panel     Status: Abnormal   Collection Time: 04/10/19  2:07 AM   Specimen: BLOOD RIGHT HAND  Result Value Ref Range Status   Specimen Description BLOOD RIGHT HAND  Final   Special Requests   Final    BOTTLES DRAWN AEROBIC AND ANAEROBIC Blood Culture adequate volume   Culture  Setup Time   Final    GRAM POSITIVE COCCI AEROBIC BOTTLE ONLY CRITICAL RESULT CALLED TO, READ BACK BY AND VERIFIED WITH: PHRMD J ORIET @ [id number] 04/11/19 BY S GEZAHEGN    Culture (A)  Final    STAPHYLOCOCCUS SPECIES (COAGULASE NEGATIVE) THE SIGNIFICANCE OF ISOLATING THIS ORGANISM FROM A SINGLE SET OF BLOOD CULTURES WHEN MULTIPLE SETS ARE DRAWN IS UNCERTAIN. PLEASE NOTIFY THE MICROBIOLOGY DEPARTMENT WITHIN ONE WEEK IF SPECIATION AND SENSITIVITIES ARE REQUIRED. Performed at [facility], [place] [address] [place], [place] [address]    Report Status 04/12/2019 FINAL  Final  Blood Culture ID Panel  (Reflexed)     Status: Abnormal   Collection Time: 04/10/19  2:07 AM  Result Value Ref Range Status   Enterococcus species NOT DETECTED NOT DETECTED Final   Listeria monocytogenes NOT DETECTED NOT DETECTED Final   Staphylococcus species DETECTED (A) NOT DETECTED Final    Comment: Methicillin (oxacillin) resistant coagulase negative staphylococcus. Possible blood culture contaminant (unless isolated from more than one blood culture draw or clinical case suggests pathogenicity). No antibiotic treatment is indicated for blood  culture contaminants. CRITICAL RESULT CALLED TO, READ BACK BY AND VERIFIED WITH: PHRMD J ORIET @ 0612 04/11/19 BY S GEZAHEGN    Staphylococcus aureus (BCID) NOT DETECTED NOT DETECTED Final   Methicillin resistance DETECTED (A) NOT DETECTED Final    Comment: CRITICAL RESULT CALLED TO, READ BACK BY AND VERIFIED WITH: PHRMD J ORIET @ [id number] 04/11/19 BY S GEZAHEGN    Streptococcus species NOT DETECTED NOT DETECTED Final   Streptococcus agalactiae NOT DETECTED NOT DETECTED Final   Streptococcus pneumoniae NOT DETECTED NOT DETECTED Final   Streptococcus pyogenes NOT DETECTED NOT DETECTED Final   Acinetobacter baumannii NOT DETECTED NOT DETECTED Final   Enterobacteriaceae species NOT DETECTED NOT DETECTED Final   Enterobacter cloacae complex NOT DETECTED NOT DETECTED Final   Escherichia coli NOT DETECTED NOT DETECTED Final   Klebsiella oxytoca NOT DETECTED NOT DETECTED Final   Klebsiella pneumoniae NOT DETECTED NOT DETECTED Final   Proteus species NOT DETECTED NOT DETECTED Final   Serratia marcescens NOT DETECTED NOT DETECTED Final   Haemophilus influenzae NOT DETECTED NOT DETECTED Final   Neisseria meningitidis NOT DETECTED NOT DETECTED Final  Pseudomonas aeruginosa NOT DETECTED NOT DETECTED Final   Candida albicans NOT DETECTED NOT DETECTED Final   Candida glabrata NOT DETECTED NOT DETECTED Final   Candida krusei NOT DETECTED NOT DETECTED Final   Candida parapsilosis NOT  DETECTED NOT DETECTED Final   Candida tropicalis NOT DETECTED NOT DETECTED Final    Comment: Performed at [facility], [place] [address], [place], [place] [address]  Culture, blood (Routine X 2) w Reflex to ID Panel     Status: None (Preliminary result)   Collection Time: 04/10/19  4:00 AM   Specimen: BLOOD  Result Value Ref Range Status   Specimen Description BLOOD CENTRAL LINE  Final   Special Requests   Final    BOTTLES DRAWN AEROBIC AND ANAEROBIC Blood Culture adequate volume   Culture   Final    NO GROWTH 3 DAYS Performed at [facility], [place] [address], [place], [place] [address]    Report Status PENDING  Incomplete  Culture, respiratory (non-expectorated)     Status: None (Preliminary result)   Collection Time: 04/11/19 10:20 AM   Specimen: Tracheal Aspirate; Respiratory  Result Value Ref Range Status   Specimen Description TRACHEAL ASPIRATE  Final   Special Requests NONE  Final   Gram Stain   Final    RARE WBC PRESENT,BOTH PMN AND MONONUCLEAR NO ORGANISMS SEEN    Culture   Final    CULTURE REINCUBATED FOR BETTER GROWTH Performed at [facility], [place] [address], [place], [place] [address]    Report Status PENDING  Incomplete         Radiology Studies: Ct Angio Chest Pe W Or Wo Contrast  Result Date: 04/12/2019 CLINICAL DATA:  Shortness of breath, chest pain EXAM: CT ANGIOGRAPHY CHEST WITH CONTRAST TECHNIQUE: Multidetector CT imaging of the chest was performed using the standard protocol during bolus administration of intravenous contrast. Multiplanar CT image reconstructions and MIPs were obtained to evaluate the vascular anatomy. CONTRAST:  156mL OMNIPAQUE IOHEXOL 350 MG/ML SOLN COMPARISON:  None. FINDINGS: Cardiovascular: Hemodialysis catheter extends to the distal SVC. Heart size upper limits normal. Small pericardial effusion. Dilated main pulmonary artery. Satisfactory opacification of pulmonary arteries noted, and there is no evidence of pulmonary  emboli. Motion degradation degrades some of the images. Coronary calcifications. Adequate contrast opacification of the thoracic aorta with no evidence of dissection, aneurysm, or stenosis. There is classic 3-vessel brachiocephalic arch anatomy without proximal stenosis. Mediastinum/Nodes: No hilar or mediastinal adenopathy. Lungs/Pleura: Small pleural effusions right greater than left. Dependent atelectasis/consolidation in both lung bases. No overt interstitial edema. Upper Abdomen: Multiple small partially calcified stones layer in the dependent aspect the gallbladder. No acute findings. Musculoskeletal: No chest wall abnormality. No acute or significant osseous findings. Review of the MIP images confirms the above findings. IMPRESSION: 1. Negative for acute PE or thoracic aortic dissection. 2. Small bilateral pleural effusions right greater than left with dependent atelectasis/consolidation in both lung bases. 3. Coronary artery disease. Please note that although the presence of coronary artery calcium documents the presence of coronary artery disease, the severity of this disease and any potential stenosis cannot be assessed on this non-gated CT examination. Assessment for potential risk factor modification, dietary therapy or pharmacologic therapy may be warranted, if clinically indicated. 4. Cholelithiasis. Electronically Signed   By: Lucrezia Europe M.D.   On: 04/12/2019 19:28   Dg Chest Port 1 View  Result Date: 04/12/2019 CLINICAL DATA:  Respiratory failure. EXAM: PORTABLE CHEST 1 VIEW COMPARISON:  Radiograph April 10, 2019. FINDINGS: Stable cardiomegaly. Endotracheal and nasogastric tubes have been removed. Right internal jugular dialysis catheter is unchanged. Left internal jugular catheter is noted with tip in left subclavian vein. No pneumothorax or pleural effusion is noted. Mild bibasilar subsegmental atelectasis is noted. Bony thorax unremarkable. IMPRESSION: Endotracheal and nasogastric tubes  have been removed. Mild bibasilar subsegmental atelectasis. Electronically Signed   By: Marijo Conception M.D.   On: 04/12/2019 07:32   Vas [place] Lower Extremity Venous (dvt)  Result Date: 04/12/2019  Lower Venous Study Indications: Edema.  Risk Factors: None identified. Limitations: Body habitus, poor ultrasound/tissue interface, bandages and open wound. Comparison Study: No prior studies. Performing Technologist: Oliver Hum RVT  Examination Guidelines: A complete evaluation includes B-mode imaging, spectral Doppler, color Doppler, and power Doppler as needed of all accessible portions of each vessel. Bilateral testing is considered an integral part of a complete examination. Limited examinations for reoccurring indications may be performed as noted.  +---------+---------------+---------+-----------+----------+--------------+ RIGHT    CompressibilityPhasicitySpontaneityPropertiesThrombus Aging +---------+---------------+---------+-----------+----------+--------------+ CFV      Full           Yes      Yes                                 +---------+---------------+---------+-----------+----------+--------------+ SFJ      Full                                                        +---------+---------------+---------+-----------+----------+--------------+ FV Prox  Full                                                        +---------+---------------+---------+-----------+----------+--------------+ FV Mid                                                Not visualized +---------+---------------+---------+-----------+----------+--------------+ FV Distal                                             Not visualized +---------+---------------+---------+-----------+----------+--------------+ PFV      Full                                                        +---------+---------------+---------+-----------+----------+--------------+ POP      Full           Yes      Yes                                  +---------+---------------+---------+-----------+----------+--------------+ PTV      Full                                                        +---------+---------------+---------+-----------+----------+--------------+  PERO     Full                                                        +---------+---------------+---------+-----------+----------+--------------+   +----+---------------+---------+-----------+----------+--------------+ LEFTCompressibilityPhasicitySpontaneityPropertiesThrombus Aging +----+---------------+---------+-----------+----------+--------------+ CFV Full           Yes      Yes                                 +----+---------------+---------+-----------+----------+--------------+     Summary: Right: There is no evidence of deep vein thrombosis in the lower extremity. However, portions of this examination were limited- see technologist comments above. No cystic structure found in the popliteal fossa. Left: No evidence of common femoral vein obstruction.  *See table(s) above for measurements and observations. Electronically signed by Harold Barban MD on 04/12/2019 at 2:13:57 PM.    Final         Scheduled Meds: . atorvastatin  40 mg Oral Q2000  . B-complex with vitamin C  1 tablet Per Tube Daily  . Chlorhexidine Gluconate Cloth  6 each Topical Q0600  . Chlorhexidine Gluconate Cloth  6 each Topical Q0600  . citalopram  40 mg Oral Daily  . darbepoetin (ARANESP) injection - DIALYSIS  150 mcg Intravenous Q Wed-HD  . feeding supplement (NEPRO CARB STEADY)  237 mL Oral TID BM  . ferric citrate  420 mg Oral TID WC  . gabapentin  300 mg Oral QHS  . heparin injection (subcutaneous)  5,000 Units Subcutaneous Q8H  . mouth rinse  15 mL Mouth Rinse BID  . Melatonin  4.5 mg Oral QHS  . midodrine  10 mg Oral Q M,W,F  . multivitamin  15 mL Per Tube Daily  . pantoprazole  40 mg Oral Daily  . tamsulosin  0.4 mg Oral QPC breakfast    Continuous Infusions: . sodium chloride    . sodium chloride    . sodium thiosulfate infusion for calciphylaxis Stopped (04/12/19 1744)     LOS: 16 days    Time spent: 35 minutes.     Elmarie Shiley, MD [facility] Pager [phone]  If 7PM-7AM, please contact night-coverage [email or web address] Password [facility] 04/13/2019, 12:44 PM

## 2019-04-13 NOTE — Progress Notes (Signed)
Michael Riggs  Assessment/ Plan: Pt is a 48 y.o. yo male with ESRD on HD, hypertension, admitted with sepsis due to right lower extremity cellulitis, calciphylaxis wound.  Patient had cardiac arrest on 9/21 and moved to ICU. HD orders: [facility] MWF 4 hrs 180NRe 400/800 120 kg 2.0 K/ 2.25 Ca (Change to 4.0 K /2.0 Ca bath on DC) -Heparin 4000 units IV TIW -Calcitriol 0.25 mcg PO TIW  #Shock suspect septic shock: Currently on vancomycin and Zosyn pending culture data.  1 bottle is growing coag negative methicillin-resistant staph.  CT scan negative for PE.  Blood pressure is better.  # ESRD: Patient had serial dialysis in the beginning now on MWF schedule.  Status post dialysis yesterday with 1.5 L UF, tolerated well.  Plan for next HD tomorrow.  Order  a dose of midodrine.  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  Continue low calcium bath, avoid VDRA.  Continue sodium thiosulfate.  Wound care and pain management per primary team.  Seen by orthopedics, plan to continue current wound management.  #Acute respiratory failure with hypoxia: Extubated now on nasal cannula.   # Anemia: Monitor hemoglobin.  Continue ESA.  Transfuse as needed.  # Secondary hyperparathyroidism: Use low calcium bath, avoid calcium based binders.  Continue Auryxia able to take orally.  #Hypotension/volume: Blood pressure improved, off pressor today.  #Dialysis Access: Patient was scheduled for elective second stage basilic vein transposition on 9/21 which was postponed because of cardiac arrest.  [facility] is following.  #Hypokalemia: No need to replete.  We will use 4K bath during dialysis.  Subjective: Seen and examined in the floor.  Denies nausea vomiting chest pain.  Some delirium overnight.  No new event.    Objective Vital signs in last 24 hours: Vitals:   04/12/19 2011 04/13/19 0613 04/13/19 0903 04/13/19 0915   BP: 108/68 133/72 128/75   Pulse: 79 70 84 86  Resp:  20 18   Temp:  98.3 F (36.8 C) 99.3 F (37.4 C)   TempSrc:   Oral   SpO2:   (!) 86% 90%  Weight: 115 kg     Height:       Weight change:   Intake/Output Summary (Last 24 hours) at 04/13/2019 0915 Last data filed at 04/13/2019 [id number] Gross per 24 hour  Intake 840.37 ml  Output 2055 ml  Net -1214.63 ml       Labs: Basic Metabolic Panel: Recent Labs  Lab 04/12/19 0522 04/12/19 1922 04/13/19 0520  NA 135 136 135  K 3.7 3.2* 3.3*  CL 101 99 98  CO2 22 24 21*  GLUCOSE 77 80 73  BUN 36* 15 18  CREATININE 6.02* 3.46* 4.44*  CALCIUM 8.1* 7.8* 8.1*  PHOS 5.3* 2.3* 2.9   Liver Function Tests: Recent Labs  Lab 04/11/19 1001 04/12/19 0522 04/12/19 1922 04/13/19 0520  AST 22  --   --   --   ALT 15  --   --   --   ALKPHOS 84  --   --   --   BILITOT 0.4  --   --   --   PROT 7.3  --   --   --   ALBUMIN 1.6* 1.6* 1.6* 1.6*   No results for input(s): LIPASE, AMYLASE in the last 168 hours. No results for input(s): AMMONIA in the last 168 hours. CBC: Recent Labs  Lab 04/10/19 0953 04/11/19 0500 04/12/19 0549 04/12/19  1922 04/13/19 0520  WBC 29.9* 26.3* 25.2* 19.9* 20.1*  NEUTROABS  --   --  20.0*  --   --   HGB 8.6* 7.8* 7.6* 8.1* 7.6*  HCT 27.7* 25.9* 27.1* 26.7* 24.7*  MCV 91.1 93.2 97.1 93.0 91.1  PLT 292 279 307 285 304   Cardiac Enzymes: No results for input(s): CKTOTAL, CKMB, CKMBINDEX, TROPONINI in the last 168 hours. CBG: Recent Labs  Lab 04/11/19 2353 04/12/19 0347 04/12/19 0823 04/12/19 1231 04/12/19 1551  GLUCAP 78 78 61* 80 77    Iron Studies: No results for input(s): IRON, TIBC, TRANSFERRIN, FERRITIN in the last 72 hours. Studies/Results: Ct Angio Chest Pe W Or Wo Contrast  Result Date: 04/12/2019 CLINICAL DATA:  Shortness of breath, chest pain EXAM: CT ANGIOGRAPHY CHEST WITH CONTRAST TECHNIQUE: Multidetector CT imaging of the chest was performed using the standard protocol during  bolus administration of intravenous contrast. Multiplanar CT image reconstructions and MIPs were obtained to evaluate the vascular anatomy. CONTRAST:  138mL OMNIPAQUE IOHEXOL 350 MG/ML SOLN COMPARISON:  None. FINDINGS: Cardiovascular: Hemodialysis catheter extends to the distal SVC. Heart size upper limits normal. Small pericardial effusion. Dilated main pulmonary artery. Satisfactory opacification of pulmonary arteries noted, and there is no evidence of pulmonary emboli. Motion degradation degrades some of the images. Coronary calcifications. Adequate contrast opacification of the thoracic aorta with no evidence of dissection, aneurysm, or stenosis. There is classic 3-vessel brachiocephalic arch anatomy without proximal stenosis. Mediastinum/Nodes: No hilar or mediastinal adenopathy. Lungs/Pleura: Small pleural effusions right greater than left. Dependent atelectasis/consolidation in both lung bases. No overt interstitial edema. Upper Abdomen: Multiple small partially calcified stones layer in the dependent aspect the gallbladder. No acute findings. Musculoskeletal: No chest wall abnormality. No acute or significant osseous findings. Review of the MIP images confirms the above findings. IMPRESSION: 1. Negative for acute PE or thoracic aortic dissection. 2. Small bilateral pleural effusions right greater than left with dependent atelectasis/consolidation in both lung bases. 3. Coronary artery disease. Please Riggs that although the presence of coronary artery calcium documents the presence of coronary artery disease, the severity of this disease and any potential stenosis cannot be assessed on this non-gated CT examination. Assessment for potential risk factor modification, dietary therapy or pharmacologic therapy may be warranted, if clinically indicated. 4. Cholelithiasis. Electronically Signed   By: Lucrezia Europe M.D.   On: 04/12/2019 19:28   Dg Chest Port 1 View  Result Date: 04/12/2019 CLINICAL DATA:   Respiratory failure. EXAM: PORTABLE CHEST 1 VIEW COMPARISON:  Radiograph April 10, 2019. FINDINGS: Stable cardiomegaly. Endotracheal and nasogastric tubes have been removed. Right internal jugular dialysis catheter is unchanged. Left internal jugular catheter is noted with tip in left subclavian vein. No pneumothorax or pleural effusion is noted. Mild bibasilar subsegmental atelectasis is noted. Bony thorax unremarkable. IMPRESSION: Endotracheal and nasogastric tubes have been removed. Mild bibasilar subsegmental atelectasis. Electronically Signed   By: Marijo Conception M.D.   On: 04/12/2019 07:32   Vas [place] Lower Extremity Venous (dvt)  Result Date: 04/12/2019  Lower Venous Study Indications: Edema.  Risk Factors: None identified. Limitations: Body habitus, poor ultrasound/tissue interface, bandages and open wound. Comparison Study: No prior studies. Performing Technologist: Oliver Hum RVT  Examination Guidelines: A complete evaluation includes B-mode imaging, spectral Doppler, color Doppler, and power Doppler as needed of all accessible portions of each vessel. Bilateral testing is considered an integral part of a complete examination. Limited examinations for reoccurring indications may be performed as noted.  +---------+---------------+---------+-----------+----------+--------------+ RIGHT  CompressibilityPhasicitySpontaneityPropertiesThrombus Aging +---------+---------------+---------+-----------+----------+--------------+ CFV      Full           Yes      Yes                                 +---------+---------------+---------+-----------+----------+--------------+ SFJ      Full                                                        +---------+---------------+---------+-----------+----------+--------------+ FV Prox  Full                                                        +---------+---------------+---------+-----------+----------+--------------+ FV Mid                                                 Not visualized +---------+---------------+---------+-----------+----------+--------------+ FV Distal                                             Not visualized +---------+---------------+---------+-----------+----------+--------------+ PFV      Full                                                        +---------+---------------+---------+-----------+----------+--------------+ POP      Full           Yes      Yes                                 +---------+---------------+---------+-----------+----------+--------------+ PTV      Full                                                        +---------+---------------+---------+-----------+----------+--------------+ PERO     Full                                                        +---------+---------------+---------+-----------+----------+--------------+   +----+---------------+---------+-----------+----------+--------------+ LEFTCompressibilityPhasicitySpontaneityPropertiesThrombus Aging +----+---------------+---------+-----------+----------+--------------+ CFV Full           Yes      Yes                                 +----+---------------+---------+-----------+----------+--------------+     Summary: Right: There is no evidence of deep  vein thrombosis in the lower extremity. However, portions of this examination were limited- see technologist comments above. No cystic structure found in the popliteal fossa. Left: No evidence of common femoral vein obstruction.  *See table(s) above for measurements and observations. Electronically signed by Harold Barban MD on 04/12/2019 at 2:13:57 PM.    Final     Medications: Infusions: . sodium chloride    . sodium chloride    . piperacillin-tazobactam (ZOSYN)  IV 3.375 g (04/13/19 0244)  . sodium thiosulfate infusion for calciphylaxis Stopped (04/12/19 1744)  . vancomycin 1,000 mg (04/12/19 2040)    Scheduled Medications: .  atorvastatin  40 mg Oral Q2000  . B-complex with vitamin C  1 tablet Per Tube Daily  . Chlorhexidine Gluconate Cloth  6 each Topical Q0600  . citalopram  40 mg Oral Daily  . darbepoetin (ARANESP) injection - DIALYSIS  150 mcg Intravenous Q Wed-HD  . fentaNYL  1 patch Transdermal Q72H  . ferric citrate  420 mg Oral TID WC  . gabapentin  300 mg Oral QHS  . heparin injection (subcutaneous)  5,000 Units Subcutaneous Q8H  . mouth rinse  15 mL Mouth Rinse BID  . Melatonin  4.5 mg Oral QHS  . midodrine  10 mg Oral Q M,W,F  . multivitamin  15 mL Per Tube Daily  . pantoprazole  40 mg Oral Daily  . tamsulosin  0.4 mg Oral QPC breakfast    have reviewed scheduled and prn medications.  Physical Exam: General: Lying in bed comfortable, not in distress Heart:RRR, s1s2 nl, no rub Lungs: Coarse breath sound bilateral, no wheezing Abdomen:soft,  non-distended Extremities: Bilateral legs with multiple calciphylaxis wounds and excoriation. Dialysis Access: Right IJ TDC, left aVF   Carrissa Taitano Prasad Leotha Voeltz 04/13/2019,9:15 AM  LOS: 16 days  Pager: ID:[phone]

## 2019-04-14 DIAGNOSIS — Z515 Encounter for palliative care: Secondary | ICD-10-CM

## 2019-04-14 LAB — RENAL FUNCTION PANEL
Albumin: 1.6 g/dL — ABNORMAL LOW (ref 3.5–5.0)
Anion gap: 18 — ABNORMAL HIGH (ref 5–15)
BUN: 26 mg/dL — ABNORMAL HIGH (ref 6–20)
CO2: 20 mmol/L — ABNORMAL LOW (ref 22–32)
Calcium: 8.6 mg/dL — ABNORMAL LOW (ref 8.9–10.3)
Chloride: 98 mmol/L (ref 98–111)
Creatinine, Ser: 6.09 mg/dL — ABNORMAL HIGH (ref 0.61–1.24)
GFR calc Af Amer: 12 mL/min — ABNORMAL LOW (ref >60)
GFR calc non Af Amer: 10 mL/min — ABNORMAL LOW (ref >60)
Glucose, Bld: 109 mg/dL — ABNORMAL HIGH (ref 70–99)
Phosphorus: 4.8 mg/dL — ABNORMAL HIGH (ref 2.5–4.6)
Potassium: 3.3 mmol/L — ABNORMAL LOW (ref 3.5–5.1)
Sodium: 136 mmol/L (ref 135–145)

## 2019-04-14 LAB — CULTURE, RESPIRATORY W GRAM STAIN: Culture: NORMAL

## 2019-04-14 LAB — CBC
HCT: 27 % — ABNORMAL LOW (ref 39.0–52.0)
Hemoglobin: 8 g/dL — ABNORMAL LOW (ref 13.0–17.0)
MCH: 27.8 pg (ref 26.0–34.0)
MCHC: 29.6 g/dL — ABNORMAL LOW (ref 30.0–36.0)
MCV: 93.8 fL (ref 80.0–100.0)
Platelets: 319 10*3/uL (ref 150–400)
RBC: 2.88 MIL/uL — ABNORMAL LOW (ref 4.22–5.81)
RDW: 17.5 % — ABNORMAL HIGH (ref 11.5–15.5)
WBC: 16.5 10*3/uL — ABNORMAL HIGH (ref 4.0–10.5)
nRBC: 0 % (ref 0.0–0.2)

## 2019-04-14 LAB — GLUCOSE, CAPILLARY
Glucose-Capillary: 104 mg/dL — ABNORMAL HIGH (ref 70–99)
Glucose-Capillary: 105 mg/dL — ABNORMAL HIGH (ref 70–99)
Glucose-Capillary: 110 mg/dL — ABNORMAL HIGH (ref 70–99)
Glucose-Capillary: 112 mg/dL — ABNORMAL HIGH (ref 70–99)
Glucose-Capillary: 192 mg/dL — ABNORMAL HIGH (ref 70–99)
Glucose-Capillary: 84 mg/dL (ref 70–99)

## 2019-04-14 LAB — PROCALCITONIN: Procalcitonin: 19.37 ng/mL

## 2019-04-14 MED ORDER — SACCHAROMYCES BOULARDII 250 MG PO CAPS
250.0000 mg | ORAL_CAPSULE | Freq: Two times a day (BID) | ORAL | Status: DC
  Administered 2019-04-14 – 2019-04-27 (×26): 250 mg via ORAL
  Filled 2019-04-14 (×26): qty 1

## 2019-04-14 MED ORDER — OXYCODONE HCL 5 MG PO TABS
ORAL_TABLET | ORAL | Status: AC
  Filled 2019-04-14: qty 1

## 2019-04-14 MED ORDER — HYDROMORPHONE HCL 2 MG PO TABS: 2.0000 mg | ORAL_TABLET | Freq: Four times a day (QID) | ORAL | Status: DC | PRN

## 2019-04-14 MED ORDER — LIDOCAINE HCL URETHRAL/MUCOSAL 2 % EX GEL
1.0000 "application " | Freq: Once | CUTANEOUS | Status: DC
  Filled 2019-04-14 (×2): qty 20

## 2019-04-14 MED ORDER — KETOROLAC TROMETHAMINE 15 MG/ML IJ SOLN
15.0000 mg | Freq: Four times a day (QID) | INTRAMUSCULAR | Status: AC | PRN
  Administered 2019-04-14 – 2019-04-16 (×5): 15 mg via INTRAVENOUS
  Filled 2019-04-14 (×5): qty 1

## 2019-04-14 MED ORDER — HEPARIN SODIUM (PORCINE) 1000 UNIT/ML IJ SOLN
INTRAMUSCULAR | Status: AC
  Filled 2019-04-14: qty 4

## 2019-04-14 MED ORDER — LIDOCAINE HCL URETHRAL/MUCOSAL 2 % EX GEL
1.0000 "application " | Freq: Once | CUTANEOUS | Status: AC
  Administered 2019-04-15: 1 via TOPICAL
  Filled 2019-04-14 (×2): qty 20

## 2019-04-14 NOTE — Progress Notes (Signed)
PROGRESS NOTE    Michael Riggs  [id number] DOB: 1971-02-08 DOA: 03/28/2019 PCP: [name], MD   Brief Narrative: 48 year old with past medical history significant for end-stage renal disease on hemodialysis TTS, hypertension, hyperlipidemia who was sent from a skilled nursing facility for evaluation of fever, confusion and right lower extremity pain concerning for infection.  Over the last 2 months he had been admitted to the hospital for worsening renal function and fluid overload.  Patient was a started on dialysis and eventually sent to rehab.  This time he was admitted for sepsis secondary to right lower extremity cellulitis with concern for necrotic tissue.  Orthopedic and plastic surgery was consulted they recommend medical management and have Dr. Sharol Given follow once he returns from his vacation. Hospital course complicated by profound hypotension and shock with loss of consciousness and require intubation.  Patient received CPR, required intubation.  Transfer to the ICU.  He was a started on IV pressors subsequently weaned off IV pressors.  He was extubated on 04/11/2019.  Patient continues to have severe lower extremity pain, Dr. Sharol Given evaluated wound on 9/23, notice some improvement. Recommended medical management.   Patient continue to have a lot of pain. He received pain medication overnight and ativan. He is more lethargic this am and confuse. ABG was ordered and showed P02 at  50. Place on oxygen. CCM consulted again. I have ordered CPAP at HS. He wake up some after been place on oxygen. Transfer to step down unit. Will consult palliative care for goals of care.      Assessment & Plan:   Principal Problem:   Wound infection Active Problems:   Hypertension   ESRD (end stage renal disease) on dialysis ([place])   Dyslipidemia   Diabetes mellitus with peripheral vascular disease ([place])   Depression   Physical deconditioning   Calciphylaxis   1-Acute Hypoxic Respiratory  Failure; Patient lost consciousness on 9/21, became severely hypotensive.  [facility] was called and he was intubated and received CPR. Patient was extubated on 9/22. He was a started on IV heparin to cover for PE.  Echo with mild dilation of right ventricle. CTA negative for PE. Doppler lower extremity limited status, negative for DVT CT angio negative for PE.  Hypoxemia, worse today. IV fentanyl discontinue. Transfer to step down unit. Place on oxygen, mentation improved.  Place of CPAP at hs.  CCM recommending palliative care consult for goals of care, high risk deterioration. difficult situation with patient in pain, requiring pain medication and resp. status.  He is more alert today, and following command. he did well with 4 L oxygen.  CPAP at HS ordered.   2-Acute metabolic, toxic. Encephalopathy: related to hypoxemia.  Patient loss of consciousness, might be related to hypotension versus pain medication. Worse today, worsening hypoxemia. Place on oxygen.  He is more alert today.   Shock,  Patient requiring IV pressors. Now off of pressors. 1 of 2 blood cultures positive for staph coagulase negative.  Patient was on IV antibiotics since 9/08---Case was discussed with ID and orthopedic. Plan to hold IV antibiotics.  Vitals stable.  White blood cell count is starting to trend down.  Right lower extremity wound, likely calciphylaxis: He continued to have leukocytosis .  He continues to have lot of pain. Dr Sharol Given wants to continue with conservative management at this time. He notice some improvement of the calciphylaxis.  Patient continue to have pain. He gets lethargic and hypoventilation with IV pain medications due to  underline OSA. Palliative care consulted.   Chronic normocytic anemia: Continue to monitor closely.  End-stage renal disease on hemodialysis. Per nephrology. Immature fistula getting dialysis through hemodialysis tunneled catheter.   Nutrition: pass swallow.   Diarrhea: C. difficile test -10 days ago. He denies abdominal pain.  imodium PRN.   Peripheral vascular disease, status post left AKA.  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   Weight as of this encounter: 117.5 kg.   DVT prophylaxis: On heparin drip Code Status: Full code Family Communication: Family updated 9-24 Disposition Plan: Transfer to the stepdown unit Consultants:   Dr. Sharol Given  Nephrology  CCM  Procedures:   Echo: Normal ejection fraction mild dilated right ventricle  Antimicrobials:  Vancomycin and Zosyn  Subjective: He is more alert today, conversant and able to carry conversation.  He does not remember what happened yesterday.  He still having a lot of pain in lower extremities.  Objective: Vitals:   04/13/19 1931 04/14/19 0005 04/14/19 0439 04/14/19 0806  BP: 137/77 129/78 128/78 128/81  Pulse: 80 75 74 72  Resp:    13  Temp: 99.5 F (37.5 C) 98.7 F (37.1 C) 98.7 F (37.1 C) 98.5 F (36.9 C)  TempSrc: Oral Oral Oral   SpO2: 100% 100% 100% 100%  Weight:   117.5 kg   Height:        Intake/Output Summary (Last 24 hours) at 04/14/2019 0811 Last data filed at 04/14/2019 0615 Gross per 24 hour  Intake 270 ml  Output 800 ml  Net -530 ml   Filed Weights   04/12/19  1748 04/12/19 2011 04/14/19 0439  Weight: 115 kg 115 kg 117.5 kg    Examination:  General exam: Alert following commands Respiratory system: Bilateral air movement crackles at the bases Cardiovascular system: S1-S2, regular rhythm and rate Gastrointestinal system: Bowel sounds present, soft nontender obese Central nervous system: Alert, following commands Extremities: AKA, dressing on right lower extremity Skin: Thin skin lesions right lower extremity and left thigh   Data Reviewed: I have personally reviewed following labs and imaging studies  CBC: Recent Labs  Lab 04/12/19 0549 04/12/19 1922 04/13/19 0520 04/13/19 1936 04/14/19 0354  WBC 25.2* 19.9* 20.1* 17.8* 16.5*  NEUTROABS 20.0*  --   --   --   --   HGB 7.6* 8.1* 7.6* 7.8* 8.0*  HCT 27.1* 26.7* 24.7* 25.6* 27.0*  MCV 97.1 93.0 91.1 91.4 93.8  PLT 307 285 304 311 [id number]   Basic Metabolic Panel: Recent Labs  Lab 04/08/19 0932 04/09/19 0802 04/10/19 0007  04/11/19 1001 04/12/19 0522 04/12/19 1922 04/13/19 0520 04/13/19 1936 04/14/19 0354  NA 134* 131* 130*   < >  --  135 136 135 134* 136  K 4.2 3.9 4.3   < >  --  3.7 3.2* 3.3* 3.3*  3.3*  CL 96* 94* 95*   < >  --  101 99 98 97* 98  CO2 25 24 23    < >  --  22 24 21* 20* 20*  GLUCOSE 99 116* 130*   < >  --  77 80 73 74 109*  BUN 20 25* 38*   < >  --  36* 15 18 23* 26*  CREATININE 4.02* 4.10* 5.54*   < >  --  6.02* 3.46* 4.44* 5.52* 6.09*  CALCIUM 8.6* 8.3* 8.2*   < >  --  8.1* 7.8* 8.1* 8.3* 8.6*  MG 2.0 1.8 1.7  --  1.8 1.8  --   --   --   --   PHOS 3.4 3.7 3.4   < >  --  5.3* 2.3* 2.9 3.8 4.8*   < > = values in this interval not displayed.   GFR: Estimated Creatinine Clearance: 19.3 mL/min (A) (by C-G formula based on SCr of 6.09 mg/dL (H)). Liver Function Tests: Recent Labs  Lab 04/11/19 1001 04/12/19 0522 04/12/19 1922 04/13/19 0520 04/13/19 1936 04/14/19 0354  AST 22  --   --   --   --   --   ALT 15  --   --   --   --   --   ALKPHOS 84  --   --    --   --   --   BILITOT 0.4  --   --   --   --   --   PROT 7.3  --   --   --   --   --   ALBUMIN 1.6* 1.6* 1.6* 1.6* 1.6* 1.6*   No results for input(s): LIPASE, AMYLASE in the last 168 hours. No results for input(s): AMMONIA in the last 168 hours. Coagulation Profile: Recent Labs  Lab 04/10/19 0309 04/11/19 0500 04/12/19 0522 04/13/19 0520  INR 1.6* 1.5* 1.5* 1.6*   Cardiac Enzymes: No results for input(s): CKTOTAL, CKMB, CKMBINDEX, TROPONINI in the last 168 hours. BNP (last 3 results) No results for input(s): PROBNP in the last 8760 hours. HbA1C: No results for input(s): HGBA1C in the last 72 hours. CBG: Recent Labs  Lab 04/12/19 1551 04/13/19 2108 04/14/19 0049 04/14/19 0420 04/14/19 0759  GLUCAP 77 70 110* 104* 84   Lipid Profile: No results for input(s): CHOL, HDL, LDLCALC, TRIG, CHOLHDL, LDLDIRECT in the last 72 hours. Thyroid Function Tests: No results for input(s): TSH, T4TOTAL, FREET4, T3FREE, THYROIDAB in the last 72 hours. Anemia Panel: No results for input(s): VITAMINB12, FOLATE, FERRITIN, TIBC, IRON, RETICCTPCT in the last 72 hours. Sepsis Labs: Recent Labs  Lab 04/10/19 0007 04/10/19 0309 04/12/19 0955 04/13/19 0520 04/14/19 0354  PROCALCITON  --   --  31.65 25.13 19.37  LATICACIDVEN 1.6 1.7  --   --   --     Recent Results (from the past 240 hour(s))  SARS CORONAVIRUS 2 (TAT 6-24 HRS) Nasopharyngeal Nasopharyngeal Swab     Status: None   Collection Time: 04/09/19  3:15 PM   Specimen: Nasopharyngeal Swab  Result Value Ref Range Status   SARS Coronavirus 2 NEGATIVE NEGATIVE Final    Comment: (NOTE) SARS-CoV-2 target nucleic acids are NOT DETECTED. The SARS-CoV-2 RNA is generally detectable in upper and lower respiratory specimens during the acute phase of infection. Negative results do not preclude SARS-CoV-2 infection, do not rule out co-infections with other pathogens, and should not be used as the sole basis for treatment or  other patient  management decisions. Negative results must be combined with clinical observations, patient history, and epidemiological information. The expected result is Negative. Fact Sheet for Patients: SugarRoll.be Fact Sheet for Healthcare Providers: https://www.woods-mathews.com/ This test is not yet approved or cleared by the [place] FDA and  has been authorized for detection and/or diagnosis of SARS-CoV-2 by FDA under an Emergency Use Authorization (EUA). This EUA will remain  in effect (meaning this test can be used) for the duration of the COVID-19 declaration under Section 56 4(b)(1) of the Act, 21 U.S.C. section 360bbb-3(b)(1), unless the authorization is terminated or revoked sooner. Performed at [facility], [place] [address] [place], [place] [address]   MRSA PCR Screening     Status: None   Collection Time: 04/10/19  1:48 AM   Specimen: Nasal Mucosa; Nasopharyngeal  Result Value Ref Range Status   MRSA by PCR NEGATIVE NEGATIVE Final    Comment:        The GeneXpert MRSA Assay (FDA approved for NASAL specimens only), is one component of a comprehensive MRSA colonization surveillance program. It is not intended to diagnose MRSA infection nor to guide or monitor treatment for MRSA infections. Performed at [facility], [place] [address] [place],  [address]   Culture, blood (Routine X 2) w Reflex to ID Panel     Status: Abnormal   Collection Time: 04/10/19  2:07 AM   Specimen: BLOOD RIGHT HAND  Result Value Ref Range Status   Specimen Description BLOOD RIGHT HAND  Final   Special Requests   Final    BOTTLES DRAWN AEROBIC AND ANAEROBIC Blood Culture adequate volume   Culture  Setup Time   Final    GRAM POSITIVE COCCI AEROBIC BOTTLE ONLY CRITICAL RESULT CALLED TO, READ BACK BY AND VERIFIED WITH: PHRMD J ORIET @ [id number] 04/11/19 BY S GEZAHEGN    Culture (A)  Final    STAPHYLOCOCCUS SPECIES (COAGULASE NEGATIVE) THE  SIGNIFICANCE OF ISOLATING THIS ORGANISM FROM A SINGLE SET OF BLOOD CULTURES WHEN MULTIPLE SETS ARE DRAWN IS UNCERTAIN. PLEASE NOTIFY THE MICROBIOLOGY DEPARTMENT WITHIN ONE WEEK IF SPECIATION AND SENSITIVITIES ARE REQUIRED. Performed at [facility], [place] [address] [place],  [address]    Report Status 04/12/2019 FINAL  Final  Blood Culture ID Panel (Reflexed)     Status: Abnormal   Collection Time: 04/10/19  2:07 AM  Result Value Ref Range Status   Enterococcus species NOT DETECTED NOT DETECTED Final   Listeria monocytogenes NOT DETECTED NOT DETECTED Final   Staphylococcus species DETECTED (A) NOT DETECTED Final    Comment: Methicillin (oxacillin) resistant coagulase negative staphylococcus. Possible blood culture contaminant (unless isolated from more than one blood culture draw or clinical case suggests pathogenicity). No antibiotic treatment is indicated for blood  culture contaminants. CRITICAL RESULT CALLED TO, READ BACK BY AND VERIFIED WITH: PHRMD J ORIET @ 0612 04/11/19 BY S GEZAHEGN    Staphylococcus aureus (BCID) NOT DETECTED NOT DETECTED Final   Methicillin resistance DETECTED (A) NOT DETECTED Final    Comment: CRITICAL RESULT CALLED TO, READ BACK BY AND VERIFIED WITH: PHRMD J ORIET @ [id number] 04/11/19 BY S GEZAHEGN    Streptococcus species NOT DETECTED NOT DETECTED Final   Streptococcus agalactiae NOT DETECTED NOT DETECTED Final   Streptococcus pneumoniae NOT DETECTED NOT DETECTED Final   Streptococcus pyogenes NOT DETECTED NOT DETECTED Final   Acinetobacter baumannii NOT DETECTED NOT DETECTED Final   Enterobacteriaceae species NOT DETECTED NOT DETECTED Final  Enterobacter cloacae complex NOT DETECTED NOT DETECTED Final   Escherichia coli NOT DETECTED NOT DETECTED Final   Klebsiella oxytoca NOT DETECTED NOT DETECTED Final   Klebsiella pneumoniae NOT DETECTED NOT DETECTED Final   Proteus species NOT DETECTED NOT DETECTED Final   Serratia marcescens NOT DETECTED NOT  DETECTED Final   Haemophilus influenzae NOT DETECTED NOT DETECTED Final   Neisseria meningitidis NOT DETECTED NOT DETECTED Final   Pseudomonas aeruginosa NOT DETECTED NOT DETECTED Final   Candida albicans NOT DETECTED NOT DETECTED Final   Candida glabrata NOT DETECTED NOT DETECTED Final   Candida krusei NOT DETECTED NOT DETECTED Final   Candida parapsilosis NOT DETECTED NOT DETECTED Final   Candida tropicalis NOT DETECTED NOT DETECTED Final    Comment: Performed at [facility], [place] [address] [place], [place] [address]  Culture, blood (Routine X 2) w Reflex to ID Panel     Status: None (Preliminary result)   Collection Time: 04/10/19  4:00 AM   Specimen: BLOOD  Result Value Ref Range Status   Specimen Description BLOOD CENTRAL LINE  Final   Special Requests   Final    BOTTLES DRAWN AEROBIC AND ANAEROBIC Blood Culture adequate volume   Culture   Final    NO GROWTH 3 DAYS Performed at [facility], [place] [address], [place], [place] [address]    Report Status PENDING  Incomplete  Culture, respiratory (non-expectorated)     Status: None (Preliminary result)   Collection Time: 04/11/19 10:20 AM   Specimen: Tracheal Aspirate; Respiratory  Result Value Ref Range Status   Specimen Description TRACHEAL ASPIRATE  Final   Special Requests NONE  Final   Gram Stain   Final    RARE WBC PRESENT,BOTH PMN AND MONONUCLEAR NO ORGANISMS SEEN    Culture   Final    CULTURE REINCUBATED FOR BETTER GROWTH Performed at [facility], [place] [address], [place],  [address]    Report Status PENDING  Incomplete         Radiology Studies: Ct Angio Chest Pe W Or Wo Contrast  Result Date: 04/12/2019 CLINICAL DATA:  Shortness of breath, chest pain EXAM: CT ANGIOGRAPHY CHEST WITH CONTRAST TECHNIQUE: Multidetector CT imaging of the chest was performed using the standard protocol during bolus administration of intravenous contrast. Multiplanar CT image reconstructions and MIPs were  obtained to evaluate the vascular anatomy. CONTRAST:  129mL OMNIPAQUE IOHEXOL 350 MG/ML SOLN COMPARISON:  None. FINDINGS: Cardiovascular: Hemodialysis catheter extends to the distal SVC. Heart size upper limits normal. Small pericardial effusion. Dilated main pulmonary artery. Satisfactory opacification of pulmonary arteries noted, and there is no evidence of pulmonary emboli. Motion degradation degrades some of the images. Coronary calcifications. Adequate contrast opacification of the thoracic aorta with no evidence of dissection, aneurysm, or stenosis. There is classic 3-vessel brachiocephalic arch anatomy without proximal stenosis. Mediastinum/Nodes: No hilar or mediastinal adenopathy. Lungs/Pleura: Small pleural effusions right greater than left. Dependent atelectasis/consolidation in both lung bases. No overt interstitial edema. Upper Abdomen: Multiple small partially calcified stones layer in the dependent aspect the gallbladder. No acute findings. Musculoskeletal: No chest wall abnormality. No acute or significant osseous findings. Review of the MIP images confirms the above findings. IMPRESSION: 1. Negative for acute PE or thoracic aortic dissection. 2. Small bilateral pleural effusions right greater than left with dependent atelectasis/consolidation in both lung bases. 3. Coronary artery disease. Please note that although the presence of coronary artery calcium documents the presence of coronary artery disease,  the severity of this disease and any potential stenosis cannot be assessed on this non-gated CT examination. Assessment for potential risk factor modification, dietary therapy or pharmacologic therapy may be warranted, if clinically indicated. 4. Cholelithiasis. Electronically Signed   By: Lucrezia Europe M.D.   On: 04/12/2019 19:28   Dg Chest Port 1 View  Result Date: 04/13/2019 CLINICAL DATA:  Hypoxemia, shortness of breath. EXAM: PORTABLE CHEST 1 VIEW COMPARISON:  Radiographs of April 12, 2019. FINDINGS: Stable cardiomegaly. No pneumothorax or pleural effusion is noted. Minimal bibasilar subsegmental atelectasis is noted. Right internal jugular dialysis catheter is unchanged in position. Bony thorax is unremarkable. IMPRESSION: Minimal bibasilar subsegmental atelectasis. Electronically Signed   By: Marijo Conception M.D.   On: 04/13/2019 13:38   Vas [place] Lower Extremity Venous (dvt)  Result Date: 04/12/2019  Lower Venous Study Indications: Edema.  Risk Factors: None identified. Limitations: Body habitus, poor ultrasound/tissue interface, bandages and open wound. Comparison Study: No prior studies. Performing Technologist: Oliver Hum RVT  Examination Guidelines: A complete evaluation includes B-mode imaging, spectral Doppler, color Doppler, and power Doppler as needed of all accessible portions of each vessel. Bilateral testing is considered an integral part of a complete examination. Limited examinations for reoccurring indications may be performed as noted.  +---------+---------------+---------+-----------+----------+--------------+ RIGHT    CompressibilityPhasicitySpontaneityPropertiesThrombus Aging +---------+---------------+---------+-----------+----------+--------------+ CFV      Full           Yes      Yes                                 +---------+---------------+---------+-----------+----------+--------------+ SFJ      Full                                                        +---------+---------------+---------+-----------+----------+--------------+ FV Prox  Full                                                        +---------+---------------+---------+-----------+----------+--------------+ FV Mid                                                Not visualized +---------+---------------+---------+-----------+----------+--------------+ FV Distal                                             Not visualized  +---------+---------------+---------+-----------+----------+--------------+ PFV      Full                                                        +---------+---------------+---------+-----------+----------+--------------+ POP      Full           Yes      Yes                                 +---------+---------------+---------+-----------+----------+--------------+  PTV      Full                                                        +---------+---------------+---------+-----------+----------+--------------+ PERO     Full                                                        +---------+---------------+---------+-----------+----------+--------------+   +----+---------------+---------+-----------+----------+--------------+ LEFTCompressibilityPhasicitySpontaneityPropertiesThrombus Aging +----+---------------+---------+-----------+----------+--------------+ CFV Full           Yes      Yes                                 +----+---------------+---------+-----------+----------+--------------+     Summary: Right: There is no evidence of deep vein thrombosis in the lower extremity. However, portions of this examination were limited- see technologist comments above. No cystic structure found in the popliteal fossa. Left: No evidence of common femoral vein obstruction.  *See table(s) above for measurements and observations. Electronically signed by Harold Barban MD on 04/12/2019 at 2:13:57 PM.    Final         Scheduled Meds: . atorvastatin  40 mg Oral Q2000  . Chlorhexidine Gluconate Cloth  6 each Topical Q0600  . Chlorhexidine Gluconate Cloth  6 each Topical Q0600  . citalopram  40 mg Oral Daily  . darbepoetin (ARANESP) injection - DIALYSIS  150 mcg Intravenous Q Wed-HD  . feeding supplement (NEPRO CARB STEADY)  237 mL Oral BID BM  . feeding supplement (PRO-STAT SUGAR FREE 64)  30 mL Oral BID  . ferric citrate  420 mg Oral TID WC  . heparin  4,000 Units Dialysis Once in  dialysis  . heparin injection (subcutaneous)  5,000 Units Subcutaneous Q8H  . mouth rinse  15 mL Mouth Rinse BID  . midodrine  10 mg Oral Q M,W,F  . multivitamin  1 tablet Oral QHS  . pantoprazole  40 mg Oral Daily   Continuous Infusions: . sodium chloride    . sodium chloride    . sodium thiosulfate infusion for calciphylaxis Stopped (04/12/19 1744)     LOS: 17 days    Time spent: 35 minutes.     Elmarie Shiley, MD [facility] Pager [phone]  If 7PM-7AM, please contact night-coverage [email or web address] Password [email or web address] 04/14/2019, 8:11 AM

## 2019-04-14 NOTE — Progress Notes (Signed)
[facility] NEPHROLOGY PROGRESS NOTE  Assessment/ Plan: Pt is a 48 y.o. yo male with ESRD on HD, hypertension, admitted with sepsis due to right lower extremity cellulitis, calciphylaxis wound.  Patient had cardiac arrest on 9/21 and moved to ICU. HD orders: [facility] MWF 4 hrs 180NRe 400/800 120 kg 2.0 K/ 2.25 Ca (Change to 4.0 K /2.0 Ca bath on DC) -Heparin 4000 units IV TIW -Calcitriol 0.25 mcg PO TIW  #Shock suspect septic shock: Clinically improved.  Antibiotics stopped by primary team after discussion with ID.  CT scan negative for PE.  Blood pressure is better.  # ESRD: Patient had serial dialysis in the beginning now on MWF schedule.  Plan for dialysis today, 4K bath.  He needs midodrine before HD.  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  Continue low calcium bath, avoid VDRA.  Continue sodium thiosulfate.  Wound care and pain management per primary team.  Seen by orthopedics, plan to continue current wound management.  #Acute respiratory failure with hypoxia: Initially required mechanical ventilation, now on nasal cannula.  # Anemia: Monitor hemoglobin.  Continue ESA.  Transfuse as needed.  # Secondary hyperparathyroidism: Use low calcium bath, avoid calcium based binders.  Continue Auryxia able to take orally.  #Hypotension/volume: Blood pressure improved, off pressor today.  #Dialysis Access: Patient was scheduled for elective second stage basilic vein transposition on 9/21 which was postponed because of cardiac arrest.  [facility] is following.  #Hypokalemia: No need to replete.  We will use 4K bath during dialysis.  Subjective: Seen and examined in the floor.  Overnight became lethargic therefore reevaluated by PCCM.  Today he looks more alert awake and following commands.  Denies chest pain or shortness of breath.  Plan for dialysis today.   Objective Vital signs in last 24 hours: Vitals:   04/13/19  1931 04/14/19 0005 04/14/19 0439 04/14/19 0806  BP: 137/77 129/78 128/78 128/81  Pulse: 80 75 74 72  Resp:    13  Temp: 99.5 F (37.5 C) 98.7 F (37.1 C) 98.7 F (37.1 C) 98.5 F (36.9 C)  TempSrc: Oral Oral Oral   SpO2: 100% 100% 100% 100%  Weight:   117.5 kg   Height:       Weight change: 0.982 kg  Intake/Output Summary (Last 24 hours) at 04/14/2019 1130 Last data filed at 04/14/2019 1100 Gross per 24 hour  Intake 1230 ml  Output 800 ml  Net 430 ml       Labs: Basic Metabolic Panel: Recent Labs  Lab 04/13/19 0520 04/13/19 1936 04/14/19 0354  NA 135 134* 136  K 3.3* 3.3* 3.3*  CL 98 97* 98  CO2 21* 20* 20*  GLUCOSE 73 74 109*  BUN 18 23* 26*  CREATININE 4.44* 5.52* 6.09*  CALCIUM 8.1* 8.3* 8.6*  PHOS 2.9 3.8 4.8*   Liver Function Tests: Recent Labs  Lab 04/11/19 1001  04/13/19 0520 04/13/19 1936 04/14/19 0354  AST 22  --   --   --   --   ALT 15  --   --   --   --   ALKPHOS 84  --   --   --   --   BILITOT 0.4  --   --   --   --   PROT 7.3  --   --   --   --   ALBUMIN 1.6*   < > 1.6* 1.6* 1.6*   < > = values in  this interval not displayed.   No results for input(s): LIPASE, AMYLASE in the last 168 hours. No results for input(s): AMMONIA in the last 168 hours. CBC: Recent Labs  Lab 04/12/19 0549 04/12/19 1922 04/13/19 0520 04/13/19 1936 04/14/19 0354  WBC 25.2* 19.9* 20.1* 17.8* 16.5*  NEUTROABS 20.0*  --   --   --   --   HGB 7.6* 8.1* 7.6* 7.8* 8.0*  HCT 27.1* 26.7* 24.7* 25.6* 27.0*  MCV 97.1 93.0 91.1 91.4 93.8  PLT 307 285 304 311 319   Cardiac Enzymes: No results for input(s): CKTOTAL, CKMB, CKMBINDEX, TROPONINI in the last 168 hours. CBG: Recent Labs  Lab 04/13/19 2108 04/14/19 0049 04/14/19 0420 04/14/19 0759 04/14/19 1109  GLUCAP 70 110* 104* 84 105*    Iron Studies: No results for input(s): IRON, TIBC, TRANSFERRIN, FERRITIN in the last 72 hours. Studies/Results: Ct Angio Chest Pe W Or Wo Contrast  Result Date:  04/12/2019 CLINICAL DATA:  Shortness of breath, chest pain EXAM: CT ANGIOGRAPHY CHEST WITH CONTRAST TECHNIQUE: Multidetector CT imaging of the chest was performed using the standard protocol during bolus administration of intravenous contrast. Multiplanar CT image reconstructions and MIPs were obtained to evaluate the vascular anatomy. CONTRAST:  183mL OMNIPAQUE IOHEXOL 350 MG/ML SOLN COMPARISON:  None. FINDINGS: Cardiovascular: Hemodialysis catheter extends to the distal SVC. Heart size upper limits normal. Small pericardial effusion. Dilated main pulmonary artery. Satisfactory opacification of pulmonary arteries noted, and there is no evidence of pulmonary emboli. Motion degradation degrades some of the images. Coronary calcifications. Adequate contrast opacification of the thoracic aorta with no evidence of dissection, aneurysm, or stenosis. There is classic 3-vessel brachiocephalic arch anatomy without proximal stenosis. Mediastinum/Nodes: No hilar or mediastinal adenopathy. Lungs/Pleura: Small pleural effusions right greater than left. Dependent atelectasis/consolidation in both lung bases. No overt interstitial edema. Upper Abdomen: Multiple small partially calcified stones layer in the dependent aspect the gallbladder. No acute findings. Musculoskeletal: No chest wall abnormality. No acute or significant osseous findings. Review of the MIP images confirms the above findings. IMPRESSION: 1. Negative for acute PE or thoracic aortic dissection. 2. Small bilateral pleural effusions right greater than left with dependent atelectasis/consolidation in both lung bases. 3. Coronary artery disease. Please note that although the presence of coronary artery calcium documents the presence of coronary artery disease, the severity of this disease and any potential stenosis cannot be assessed on this non-gated CT examination. Assessment for potential risk factor modification, dietary therapy or pharmacologic therapy may be  warranted, if clinically indicated. 4. Cholelithiasis. Electronically Signed   By: Lucrezia Europe M.D.   On: 04/12/2019 19:28   Dg Chest Port 1 View  Result Date: 04/13/2019 CLINICAL DATA:  Hypoxemia, shortness of breath. EXAM: PORTABLE CHEST 1 VIEW COMPARISON:  Radiographs of April 12, 2019. FINDINGS: Stable cardiomegaly. No pneumothorax or pleural effusion is noted. Minimal bibasilar subsegmental atelectasis is noted. Right internal jugular dialysis catheter is unchanged in position. Bony thorax is unremarkable. IMPRESSION: Minimal bibasilar subsegmental atelectasis. Electronically Signed   By: Marijo Conception M.D.   On: 04/13/2019 13:38    Medications: Infusions: . sodium chloride    . sodium chloride    . sodium thiosulfate infusion for calciphylaxis Stopped (04/12/19 1744)    Scheduled Medications: . atorvastatin  40 mg Oral Q2000  . Chlorhexidine Gluconate Cloth  6 each Topical Q0600  . Chlorhexidine Gluconate Cloth  6 each Topical Q0600  . citalopram  40 mg Oral Daily  . darbepoetin (ARANESP) injection - DIALYSIS  150 mcg Intravenous Q Wed-HD  . feeding supplement (NEPRO CARB STEADY)  237 mL Oral BID BM  . feeding supplement (PRO-STAT SUGAR FREE 64)  30 mL Oral BID  . ferric citrate  420 mg Oral TID WC  . heparin  4,000 Units Dialysis Once in dialysis  . heparin injection (subcutaneous)  5,000 Units Subcutaneous Q8H  . mouth rinse  15 mL Mouth Rinse BID  . midodrine  10 mg Oral Q M,W,F  . multivitamin  1 tablet Oral QHS  . pantoprazole  40 mg Oral Daily  . saccharomyces boulardii  250 mg Oral BID    have reviewed scheduled and prn medications.  Physical Exam: General: Lying in bed comfortable, not in distress Heart:RRR, s1s2 nl, no rub Lungs: Coarse breath sound bilateral, no wheezing Abdomen:soft,  non-distended Extremities: Bilateral legs with multiple calciphylaxis wounds and excoriation. Neurology: Alert, awake, following commands and pleasant. Dialysis Access:  Right IJ TDC, left aVF   Michael Riggs 04/14/2019,11:30 AM  LOS: 17 days  Pager: [phone]

## 2019-04-14 NOTE — Care Management Important Message (Signed)
Important Message  Patient Details  Name: Michael Riggs MRN: [id number] Date of Birth: Nov 28, 1970   Medicare Important Message Given:  Yes     [name] 04/14/2019, 11:53 AM

## 2019-04-14 NOTE — Progress Notes (Signed)
Subjective: Seen for f/u of lower extremity calciphylaxis.   Alert and appropriate. Reports pain with movement/dressing change to right lower leg.   Objective: Vital signs in last 24 hours: Temp:  [98.4 F (36.9 C)-99.5 F (37.5 C)] 98.5 F (36.9 C) (09/25 0806) Pulse Rate:  [72-86] 72 (09/25 0806) Resp:  [13-21] 13 (09/25 0806) BP: (128-144)/(75-86) 128/81 (09/25 0806) SpO2:  [86 %-100 %] 100 % (09/25 0806) Weight:  [117.5 kg] 117.5 kg (09/25 0439)  Intake/Output from previous day: 09/24 0701 - 09/25 0700 In: 270 [P.O.:270] Out: 800 [Stool:800] Intake/Output this shift: No intake/output data recorded.  Recent Labs    04/12/19 0549 04/12/19 1922 04/13/19 0520 04/13/19 1936 04/14/19 0354  HGB 7.6* 8.1* 7.6* 7.8* 8.0*   Recent Labs    04/13/19 1936 04/14/19 0354  WBC 17.8* 16.5*  RBC 2.80* 2.88*  HCT 25.6* 27.0*  PLT 311 319   Recent Labs    04/13/19 1936 04/14/19 0354  NA 134* 136  K 3.3* 3.3*  CL 97* 98  CO2 20* 20*  BUN 23* 26*  CREATININE 5.52* 6.09*  GLUCOSE 74 109*  CALCIUM 8.3* 8.6*   Recent Labs    04/12/19 0522 04/13/19 0520  INR 1.5* 1.6*    Right lower leg and bilateral upper legs with black eschar as noted with some healing of lower leg . No signs of cellulitis/infection. Palpable dorsalis pedis pulse right foot.    Assessment/Plan: Bilateral lower extremity calciphylaxis- Continue local care with xeroform dressing changes daily.  Will continue to monitor periodically.   Erlinda Hong, PA-C 04/14/2019, 8:26 AM  [facility] [phone]

## 2019-04-14 NOTE — Consult Note (Signed)
Consultation Note Date: 04/14/2019   Patient Name: Michael Riggs  DOB: 02-20-1971  MRN: [id number]  Age / Sex: 48 y.o., male  PCP: Michael Nakai, MD Referring Physician: Elmarie Shiley, MD  Reason for Consultation: Establishing goals of care  HPI/Patient Profile: 48 y.o. male  with past medical history of DM, PVD, Left BKA, who started hemodialysis in July 2020, now with calciphylaxis who was admitted on 03/28/2019 with respiratory failure.  His hospitalization has been complicated by fever, delirium, and cardiac arrest requiring 6 min of CPR on 9/21.  Clinical Assessment and Goals of Care:  I have reviewed medical records including EPIC notes, labs and imaging, received report from the bedside RN, assessed the patient and set a time  to discuss diagnosis prognosis, GOC, EOL wishes, disposition and options with he and his loved ones.  I introduced Palliative Medicine as specialized medical care for people living with serious illness. It focuses on providing relief from the symptoms and stress of a serious illness. The goal is to improve quality of life for both the patient and the family.  We discussed a brief life review of the patient. He is originally from [place], and grew up in [place].  He is not married and has no children but has a very close relationship with his siblings and mother.  [name] was in hemodialysis when I spoke with him.  Consequently we did not have an indepth conversation.  He was very concerned with the discomfort being caused by his rectal tube and requested that it be exchanged.  We also talked about his wound pain on both legs.  It feels like a deep burning.  It is intermittent and worse with any movement.  [name] found temporary relief when the lidocaine jelly was used in his wounds prior to dressing them today.    Unfortunately [name] had respiratory  depression and arrest on 9/21 while receiving fentanyl for pain. This makes his pain very challenging to address.  He had a 50 mcg patch on and was receiving 25 - 50 mcg IV PRN.     Primary Decision Maker:  PATIENT    SUMMARY OF RECOMMENDATIONS    Will talk with psych about substituting cymbalta for celexa to help with neuropathic pain.  May try low dose gabapentin as well if ok with nephrology.  Thiosulfate appears to have been stopped on 9/23.  Will follow up to see if this medication can be re-started with hemodialysis.  Will arrange a [place] meeting with patient's family present for support.  Hopefully to take place 9/26.  Recommend he be followed by Palliative Care outpatient at discharge.  Code Status/Advance Care Planning:  Full code   Symptom Management:   Lidocaine jelly applied directly to wounds.  Switch oxycodone for low dose oral dilaudid as dilaudid has less side effects in renal patients (less likely to cause delirium) and may have a better effect.  Will consider adding cymbalta and low dose gabapentin.  Thiosulfate with HD.  Excellent  wound care.  Can he receive follow up at the [facility] outpatient?  Palliative Prophylaxis:   Frequent Pain Assessment  Psycho-social/Spiritual:   Desire for further Chaplaincy support: Patient is Catholic  Prognosis:  Given rapid decline, ESRD with Calciphylaxis and recent septic shock it would not be unexpected for [name] to have a prognosis of less than 1 year.  Discharge [facility] for rehab with Palliative care service follow-up      Primary Diagnoses: Present on Admission: . Wound infection . Hypertension . Dyslipidemia . Diabetes mellitus with peripheral vascular disease ([place]) . Depression . Physical deconditioning   I have reviewed the medical record, interviewed the patient and family, and examined the patient. The following aspects are pertinent.  Past Medical  History:  Diagnosis Date  . Anemia   . Depression   . Diabetes mellitus with peripheral vascular disease ([place])   . Dyslipidemia   . Dyspnea   . ESRD (end stage renal disease) on dialysis ([place]) 01/2019   MWF  . GERD (gastroesophageal reflux disease)   . Hypertension   . Physical deconditioning   . Syphilis 01/2019   Social History   Socioeconomic History  . Marital status: Divorced    Spouse name: Not on file  . Number of children: Not on file  . Years of education: Not on file  . Highest education level: Not on file  Occupational History  . Not on file  Social Needs  . Financial resource strain: Not on file  . Food insecurity    Worry: Not on file    Inability: Not on file  . Transportation needs    Medical: Not on file    Non-medical: Not on file  Tobacco Use  . Smoking status: Former Smoker    Types: Cigarettes    Start date: 11/18/2018    Quit date: 01/18/2019    Years since quitting: 0.2  . Smokeless tobacco: Never Used  . Tobacco comment: pt stated got bored with it  Substance and Sexual Activity  . Alcohol use: Not Currently    Comment: heavy drinker in the past, none since 07/19/18  . Drug use: Not Currently  . Sexual activity: Not Currently  Lifestyle  . Physical activity    Days per week: Not on file    Minutes per session: Not on file  . Stress: Not on file  Relationships  . Social Herbalist on phone: Not on file    Gets together: Not on file    Attends religious service: Not on file    Active member of club or organization: Not on file    Attends meetings of clubs or organizations: Not on file    Relationship status: Not on file  Other Topics Concern  . Not on file  Social History Narrative  . Not on file   No family history on file. Scheduled Meds: . atorvastatin  40 mg Oral Q2000  . Chlorhexidine Gluconate Cloth  6 each Topical Q0600  . Chlorhexidine Gluconate Cloth  6 each Topical Q0600  . citalopram  40 mg Oral Daily  .  darbepoetin (ARANESP) injection - DIALYSIS  150 mcg Intravenous Q Wed-HD  . feeding supplement (NEPRO CARB STEADY)  237 mL Oral BID BM  . feeding supplement (PRO-STAT SUGAR FREE 64)  30 mL Oral BID  . ferric citrate  420 mg Oral TID WC  . heparin  4,000 Units Dialysis Once in dialysis  . heparin  injection (subcutaneous)  5,000 Units Subcutaneous Q8H  . mouth rinse  15 mL Mouth Rinse BID  . midodrine  10 mg Oral Q M,W,F  . multivitamin  1 tablet Oral QHS  . pantoprazole  40 mg Oral Daily  . saccharomyces boulardii  250 mg Oral BID   Continuous Infusions: . sodium chloride    . sodium chloride    . sodium thiosulfate infusion for calciphylaxis Stopped (04/12/19 1744)   PRN Meds:.sodium chloride, sodium chloride, acetaminophen **OR** acetaminophen, albuterol, alteplase, bisacodyl, docusate sodium, heparin, heparin, hydrocortisone, ketorolac, lidocaine (PF), lidocaine-prilocaine, loperamide, ondansetron **OR** ondansetron (ZOFRAN) IV, oxyCODONE, pentafluoroprop-tetrafluoroeth, polyethylene glycol, senna-docusate No Known Allergies Review of Systems complains of rectal tube pain, diarrhea, wound pain, hunger.  Physical Exam  Awake, alert, orientated, friendly. CV rrr resp no distress  Abdomen soft, nt, nd LLE BKA Skin/wounds not examined in HD  Vital Signs: BP 128/81   Pulse 72   Temp 98.5 F (36.9 C)   Resp 13   Ht 5\' 10"  (1.778 m)   Wt 117.5 kg   SpO2 100%   BMI 37.16 kg/m  Pain Scale: 0-10 POSS *See Group Information*: S-Acceptable,Sleep, easy to arouse Pain Score: Asleep   SpO2: SpO2: 100 % O2 Device:SpO2: 100 % O2 Flow Rate: .O2 Flow Rate (L/min): 3 L/min  IO: Intake/output summary:   Intake/Output Summary (Last 24 hours) at 04/14/2019 0836 Last data filed at 04/14/2019 0615 Gross per 24 hour  Intake 270 ml  Output 800 ml  Net -530 ml    LBM: Last BM Date: 04/13/19 Baseline Weight: Weight: 120.2 kg Most recent weight: Weight: 117.5 kg     Palliative  Assessment/Data: 30%     Time In: 4:00 Time Out: 4:50 Time Total: 50 min Visit consisted of counseling and education dealing with the complex and emotionally intense issues surrounding the need for palliative care and symptom management in the setting of serious and potentially life-threatening illness. Greater than 50%  of this time was spent counseling and coordinating care related to the above assessment and plan.  Signed by: Florentina Jenny, PA-C Palliative Medicine Pager: [phone]  Please contact Palliative Medicine Team phone at [phone] for questions and concerns.  For individual provider: See Shea Evans

## 2019-04-14 NOTE — Procedures (Addendum)
Patient Name: Michael Riggs  MRN: [id number]  Epilepsy Attending: Lora Havens  Referring Physician/Provider: Dr Niel Hummer Date: 04/13/2019 Duration: 26.12 mins  Patient history: 48 yo M with ams. EEG to evaluate for seizure.   Level of alertness: obtunded  AEDs during EEG study: None  Technical aspects: This EEG study was done with scalp electrodes positioned according to the 10-20 International system of electrode placement. Electrical activity was acquired at a sampling rate of 500Hz  and reviewed with a high frequency filter of 70Hz  and a low frequency filter of 1Hz . EEG data were recorded continuously and digitally stored.   DESCRIPTION: EEG showed continuous generalized 2-6Hz  theta-delta slowing. EEG was reactive to tactile stimulation. Photic driving was not seen during photic stimulation. Hyperventilation was not performed.  Abnormality 1.  Continuous slow, generalized  IMPRESSION: This study is suggestive of severe diffuse encephalopathy, nonspecific to etiology. No seizures or epileptiform discharges were seen throughout the recording.  Maysin Carstens Barbra Sarks

## 2019-04-14 NOTE — Progress Notes (Signed)
PT Cancellation Note  Patient Details Name: Michael Riggs MRN: [id number] DOB: 11-17-1970   Cancelled Treatment:    Reason Eval/Treat Not Completed: (P) Patient at procedure or test/unavailable Pt off floor for HD. PT will follow back for Re-Eval tomorrow.   Avaiah Stempel B. Migdalia Dk PT, DPT Acute Rehabilitation Services Pager [phone] Office [phone]  [place] 04/14/2019, 1:30 PM

## 2019-04-14 NOTE — Progress Notes (Signed)
OT Cancellation Note  Patient Details Name: Michael Riggs MRN: [id number] DOB: [birth year]   Cancelled Treatment:    Reason Eval/Treat Not Completed: Patient at procedure or test/ unavailable(HD)  [name] Joda Braatz 04/14/2019, 6:57 PM   [name] OTR/L Acute Rehabilitation Services Pager: [phone] Office: [phone]

## 2019-04-15 DIAGNOSIS — Z7189 Other specified counseling: Secondary | ICD-10-CM

## 2019-04-15 DIAGNOSIS — Z515 Encounter for palliative care: Secondary | ICD-10-CM

## 2019-04-15 LAB — CBC
HCT: 27.5 % — ABNORMAL LOW (ref 39.0–52.0)
Hemoglobin: 8.3 g/dL — ABNORMAL LOW (ref 13.0–17.0)
MCH: 27.8 pg (ref 26.0–34.0)
MCHC: 30.2 g/dL (ref 30.0–36.0)
MCV: 92 fL (ref 80.0–100.0)
Platelets: 309 10*3/uL (ref 150–400)
RBC: 2.99 MIL/uL — ABNORMAL LOW (ref 4.22–5.81)
RDW: 17.4 % — ABNORMAL HIGH (ref 11.5–15.5)
WBC: 13 10*3/uL — ABNORMAL HIGH (ref 4.0–10.5)
nRBC: 0.2 % (ref 0.0–0.2)

## 2019-04-15 LAB — GLUCOSE, CAPILLARY
Glucose-Capillary: 107 mg/dL — ABNORMAL HIGH (ref 70–99)
Glucose-Capillary: 134 mg/dL — ABNORMAL HIGH (ref 70–99)
Glucose-Capillary: 136 mg/dL — ABNORMAL HIGH (ref 70–99)
Glucose-Capillary: 137 mg/dL — ABNORMAL HIGH (ref 70–99)
Glucose-Capillary: 140 mg/dL — ABNORMAL HIGH (ref 70–99)
Glucose-Capillary: 142 mg/dL — ABNORMAL HIGH (ref 70–99)

## 2019-04-15 LAB — CULTURE, BLOOD (ROUTINE X 2)
Culture: NO GROWTH
Special Requests: ADEQUATE

## 2019-04-15 MED ORDER — OXYCODONE HCL 5 MG PO TABS
5.0000 mg | ORAL_TABLET | Freq: Four times a day (QID) | ORAL | Status: DC | PRN
  Administered 2019-04-16 – 2019-04-27 (×23): 5 mg via ORAL
  Filled 2019-04-15 (×23): qty 1

## 2019-04-15 MED ORDER — TRAMADOL HCL 50 MG PO TABS
50.0000 mg | ORAL_TABLET | Freq: Four times a day (QID) | ORAL | Status: DC | PRN
  Administered 2019-04-16 – 2019-04-24 (×10): 50 mg via ORAL
  Filled 2019-04-15 (×10): qty 1

## 2019-04-15 MED ORDER — DULOXETINE HCL 30 MG PO CPEP
30.0000 mg | ORAL_CAPSULE | Freq: Every day | ORAL | Status: DC
  Administered 2019-04-16 – 2019-04-22 (×7): 30 mg via ORAL
  Filled 2019-04-15 (×7): qty 1

## 2019-04-15 NOTE — Progress Notes (Signed)
PROGRESS NOTE    Michael Riggs  [name] DOB: 03-28-1971 DOA: 03/28/2019 PCP: [name], MD   Brief Narrative: 48 year old with past medical history significant for end-stage renal disease on hemodialysis TTS, hypertension, hyperlipidemia who was sent from a skilled nursing facility for evaluation of fever, confusion and right lower extremity pain concerning for infection.  Over the last 2 months he had been admitted to the hospital for worsening renal function and fluid overload.  Patient was a started on dialysis and eventually sent to rehab.  This time he was admitted for sepsis secondary to right lower extremity cellulitis with concern for necrotic tissue.  Orthopedic and plastic surgery was consulted they recommend medical management and have Dr. Sharol Given follow once he returns from his vacation. Hospital course complicated by profound hypotension and shock with loss of consciousness and require intubation.  Patient received CPR, required intubation.  Transfer to the ICU.  He was a started on IV pressors subsequently weaned off IV pressors.  He was extubated on 04/11/2019.  Patient continues to have severe lower extremity pain, Dr. Sharol Given evaluated wound on 9/23, notice some improvement. Recommended medical management.   Patient continue to have a lot of pain. He received pain medication overnight and ativan. He is more lethargic this am and confuse. ABG was ordered and showed P02 at  50. Place on oxygen. CCM consulted again. I have ordered CPAP at HS. He wake up some after been place on oxygen. Transfer to step down unit. Will consult palliative care for goals of care.      Assessment & Plan:   Principal Problem:   Wound infection Active Problems:   Hypertension   ESRD (end stage renal disease) on dialysis ([place])   Dyslipidemia   Diabetes mellitus with peripheral vascular disease ([place])   Depression   Physical deconditioning   Calciphylaxis   Palliative care  encounter   1-Acute Hypoxic Respiratory Failure; Patient lost consciousness on 9/21, became severely hypotensive.  [facility] was called and he was intubated and received CPR. Patient was extubated on 9/22. He was a started on IV heparin to cover for PE.  Echo with mild dilation of right ventricle. CTA negative for PE. Doppler lower extremity limited status, negative for DVT CT angio negative for PE.  Hypoxemia, worse 9-24. IV fentanyl discontinue. Transfer to step down unit. Place on oxygen, mentation improved.  Place of CPAP at hs.  CCM recommending palliative care consult for goals of care, high risk deterioration. difficult situation with patient in pain, requiring pain medication and resp. status.  CPAP at HS ordered.   2-Acute metabolic, toxic. Encephalopathy: related to hypoxemia.  Patient loss of consciousness, might be related to hypotension versus pain medication. worsening hypoxemia 9-24. Place on oxygen.  He is more alert and conversant. Needs to use oxygen. Needs to be cautious with pain medications.   Shock,  Patient requiring IV pressors. Now off of pressors. 1 of 2 blood cultures positive for staph coagulase negative.  Patient was on IV antibiotics since 9/08---Case was discussed with ID and orthopedic. Plan to hold IV antibiotics.  Vitals stable.  White blood cell count is starting to trend down.  Right lower extremity wound, likely calciphylaxis: He continues to have lot of pain. Dr Sharol Given wants to continue with conservative management at this time. He notice some improvement of the calciphylaxis.  Patient continue to have pain. He gets lethargic and hypoventilation with IV pain medications due to underline OSA. Palliative care consulted.  Chronic normocytic anemia: Continue to monitor closely.  End-stage renal disease on hemodialysis. Per nephrology. Immature fistula getting dialysis through hemodialysis tunneled catheter.   Nutrition: pass swallow.  Diarrhea:  C. difficile test -10 days ago. He denies abdominal pain.  imodium PRN. Improved.   Peripheral vascular disease, status post left AKA.  Pressure Injury 02/27/19 Penis WOC evaluation day of PIP data, this wound is trauma related to [facility] insertions and removals and documented abnormal male penile tissue per urology (Active)  02/27/19 1000  Location: Penis  Location Orientation:   Staging:   Wound Description (Comments): WOC evaluation day of PIP data, this wound is trauma related to [facility] insertions and removals and documented abnormal male penile tissue per urology  Present on Admission: No     Nutrition Problem: Increased nutrient needs Etiology: wound healing    Signs/Symptoms: estimated needs    Interventions: MVI, Prostat  Estimated body mass index is 36.45 kg/m as calculated from the following:   Height as of this encounter: 5\' 10"  (1.778 m).   Weight as of this encounter: 115.2 kg.   DVT prophylaxis: On heparin drip Code Status: Full code Family Communication: Family updated 9-24 Disposition Plan: Transfer to the stepdown unit Consultants:   Dr. Sharol Given  Nephrology  CCM  Procedures:   Echo: Normal ejection fraction mild dilated right ventricle  Antimicrobials:  Vancomycin and Zosyn  Subjective: He is alert and conversant. He is complaining of pain LE.  Diarrhea has more consistency.   Objective: Vitals:   04/15/19 0436 04/15/19 0804 04/15/19 0855 04/15/19 1139  BP: 139/83 133/71 139/79 135/74  Pulse: 76 80 78 80  Resp: 14 14 14 16   Temp: 99.3 F (37.4 C) 98.8 F (37.1 C)  98.4 F (36.9 C)  TempSrc: Oral Oral  Oral  SpO2: 100% 94% 100%   Weight: 115.2 kg     Height:        Intake/Output Summary (Last 24 hours) at 04/15/2019 1311 Last data filed at 04/15/2019 0915 Gross per 24 hour  Intake 360 ml  Output 3000 ml  Net -2640 ml   Filed Weights   04/12/19 2011 04/14/19 0439 04/15/19 0436  Weight: 115 kg 117.5 kg 115.2 kg     Examination:  General exam: Alert, following command Respiratory system: Decreased breath sounds.  Cardiovascular system: S 1, S 2 RRR Gastrointestinal system: BS present, soft, nt Central nervous system: Alert, following command Extremities: S/P AKA, right LE with dressing.  Skin: Thin skin lesions right lower extremity and left thigh   Data Reviewed: I have personally reviewed following labs and imaging studies  CBC: Recent Labs  Lab 04/12/19 0549 04/12/19 1922 04/13/19 0520 04/13/19 1936 04/14/19 0354 04/15/19 0301  WBC 25.2* 19.9* 20.1* 17.8* 16.5* 13.0*  NEUTROABS 20.0*  --   --   --   --   --   HGB 7.6* 8.1* 7.6* 7.8* 8.0* 8.3*  HCT 27.1* 26.7* 24.7* 25.6* 27.0* 27.5*  MCV 97.1 93.0 91.1 91.4 93.8 92.0  PLT 307 285 304 311 319 [id number]   Basic Metabolic Panel: Recent Labs  Lab 04/09/19 0802 04/10/19 0007  04/11/19 1001 04/12/19 0522 04/12/19 1922 04/13/19 0520 04/13/19 1936 04/14/19 0354  NA 131* 130*   < >  --  135 136 135 134* 136  K 3.9 4.3   < >  --  3.7 3.2* 3.3* 3.3* 3.3*  CL 94* 95*   < >  --  101 99 98 97* 98  CO2 24  23   < >  --  22 24 21* 20* 20*  GLUCOSE 116* 130*   < >  --  77 80 73 74 109*  BUN 25* 38*   < >  --  36* 15 18 23* 26*  CREATININE 4.10* 5.54*   < >  --  6.02* 3.46* 4.44* 5.52* 6.09*  CALCIUM 8.3* 8.2*   < >  --  8.1* 7.8* 8.1* 8.3* 8.6*  MG 1.8 1.7  --  1.8 1.8  --   --   --   --   PHOS 3.7 3.4   < >  --  5.3* 2.3* 2.9 3.8 4.8*   < > = values in this interval not displayed.   GFR: Estimated Creatinine Clearance: 19.1 mL/min (A) (by C-G formula based on SCr of 6.09 mg/dL (H)). Liver Function Tests: Recent Labs  Lab 04/11/19 1001 04/12/19 0522 04/12/19 1922 04/13/19 0520 04/13/19 1936 04/14/19 0354  AST 22  --   --   --   --   --   ALT 15  --   --   --   --   --   ALKPHOS 84  --   --   --   --   --   BILITOT 0.4  --   --   --   --   --   PROT 7.3  --   --   --   --   --   ALBUMIN 1.6* 1.6* 1.6* 1.6* 1.6* 1.6*   No  results for input(s): LIPASE, AMYLASE in the last 168 hours. No results for input(s): AMMONIA in the last 168 hours. Coagulation Profile: Recent Labs  Lab 04/10/19 0309 04/11/19 0500 04/12/19 0522 04/13/19 0520  INR 1.6* 1.5* 1.5* 1.6*   Cardiac Enzymes: No results for input(s): CKTOTAL, CKMB, CKMBINDEX, TROPONINI in the last 168 hours. BNP (last 3 results) No results for input(s): PROBNP in the last 8760 hours. HbA1C: No results for input(s): HGBA1C in the last 72 hours. CBG: Recent Labs  Lab 04/14/19 2033 04/14/19 2359 04/15/19 0438 04/15/19 0803 04/15/19 1137  GLUCAP 192* 134* 137* 107* 136*   Lipid Profile: No results for input(s): CHOL, HDL, LDLCALC, TRIG, CHOLHDL, LDLDIRECT in the last 72 hours. Thyroid Function Tests: No results for input(s): TSH, T4TOTAL, FREET4, T3FREE, THYROIDAB in the last 72 hours. Anemia Panel: No results for input(s): VITAMINB12, FOLATE, FERRITIN, TIBC, IRON, RETICCTPCT in the last 72 hours. Sepsis Labs: Recent Labs  Lab 04/10/19 0007 04/10/19 0309 04/12/19 0955 04/13/19 0520 04/14/19 0354  PROCALCITON  --   --  31.65 25.13 19.37  LATICACIDVEN 1.6 1.7  --   --   --     Recent Results (from the past 240 hour(s))  SARS CORONAVIRUS 2 (TAT 6-24 HRS) Nasopharyngeal Nasopharyngeal Swab     Status: None   Collection Time: 04/09/19  3:15 PM   Specimen: Nasopharyngeal Swab  Result Value Ref Range Status   SARS Coronavirus 2 NEGATIVE NEGATIVE Final    Comment: (NOTE) SARS-CoV-2 target nucleic acids are NOT DETECTED. The SARS-CoV-2 RNA is generally detectable in upper and lower respiratory specimens during the acute phase of infection. Negative results do not preclude SARS-CoV-2 infection, do not rule out co-infections with other pathogens, and should not be used as the sole basis for treatment or other patient management decisions. Negative results must be combined with clinical observations, patient history, and epidemiological  information. The expected result is Negative. Fact Sheet for Patients: SugarRoll.be  Fact Sheet for Healthcare Providers: https://www.woods-mathews.com/ This test is not yet approved or cleared by the [place] FDA and  has been authorized for detection and/or diagnosis of SARS-CoV-2 by FDA under an Emergency Use Authorization (EUA). This EUA will remain  in effect (meaning this test can be used) for the duration of the COVID-19 declaration under Section 56 4(b)(1) of the Act, 21 U.S.C. section 360bbb-3(b)(1), unless the authorization is terminated or revoked sooner. Performed at [facility], [place] [address], [place], [place] [address]   MRSA PCR Screening     Status: None   Collection Time: 04/10/19  1:48 AM   Specimen: Nasal Mucosa; Nasopharyngeal  Result Value Ref Range Status   MRSA by PCR NEGATIVE NEGATIVE Final    Comment:        The GeneXpert MRSA Assay (FDA approved for NASAL specimens only), is one component of a comprehensive MRSA colonization surveillance program. It is not intended to diagnose MRSA infection nor to guide or monitor treatment for MRSA infections. Performed at [facility], [place] [address] [place], [place] [address]   Culture, blood (Routine X 2) w Reflex to ID Panel     Status: Abnormal   Collection Time: 04/10/19  2:07 AM   Specimen: BLOOD RIGHT HAND  Result Value Ref Range Status   Specimen Description BLOOD RIGHT HAND  Final   Special Requests   Final    BOTTLES DRAWN AEROBIC AND ANAEROBIC Blood Culture adequate volume   Culture  Setup Time   Final    GRAM POSITIVE COCCI AEROBIC BOTTLE ONLY CRITICAL RESULT CALLED TO, READ BACK BY AND VERIFIED WITH: PHRMD J ORIET @ [id number] 04/11/19 BY S GEZAHEGN    Culture (A)  Final    STAPHYLOCOCCUS SPECIES (COAGULASE NEGATIVE) THE SIGNIFICANCE OF ISOLATING THIS ORGANISM FROM A SINGLE SET OF BLOOD CULTURES WHEN MULTIPLE SETS ARE DRAWN IS UNCERTAIN.  PLEASE NOTIFY THE MICROBIOLOGY DEPARTMENT WITHIN ONE WEEK IF SPECIATION AND SENSITIVITIES ARE REQUIRED. Performed at [facility], [place] [address] [place], [place] [address]    Report Status 04/12/2019 FINAL  Final  Blood Culture ID Panel (Reflexed)     Status: Abnormal   Collection Time: 04/10/19  2:07 AM  Result Value Ref Range Status   Enterococcus species NOT DETECTED NOT DETECTED Final   Listeria monocytogenes NOT DETECTED NOT DETECTED Final   Staphylococcus species DETECTED (A) NOT DETECTED Final    Comment: Methicillin (oxacillin) resistant coagulase negative staphylococcus. Possible blood culture contaminant (unless isolated from more than one blood culture draw or clinical case suggests pathogenicity). No antibiotic treatment is indicated for blood  culture contaminants. CRITICAL RESULT CALLED TO, READ BACK BY AND VERIFIED WITH: PHRMD J ORIET @ 0612 04/11/19 BY S GEZAHEGN    Staphylococcus aureus (BCID) NOT DETECTED NOT DETECTED Final   Methicillin resistance DETECTED (A) NOT DETECTED Final    Comment: CRITICAL RESULT CALLED TO, READ BACK BY AND VERIFIED WITH: PHRMD J ORIET @ [id number] 04/11/19 BY S GEZAHEGN    Streptococcus species NOT DETECTED NOT DETECTED Final   Streptococcus agalactiae NOT DETECTED NOT DETECTED Final   Streptococcus pneumoniae NOT DETECTED NOT DETECTED Final   Streptococcus pyogenes NOT DETECTED NOT DETECTED Final   Acinetobacter baumannii NOT DETECTED NOT DETECTED Final   Enterobacteriaceae species NOT DETECTED NOT DETECTED Final   Enterobacter cloacae complex NOT DETECTED NOT DETECTED Final   Escherichia coli NOT DETECTED NOT DETECTED Final   Klebsiella oxytoca NOT DETECTED NOT DETECTED Final  Klebsiella pneumoniae NOT DETECTED NOT DETECTED Final   Proteus species NOT DETECTED NOT DETECTED Final   Serratia marcescens NOT DETECTED NOT DETECTED Final   Haemophilus influenzae NOT DETECTED NOT DETECTED Final   Neisseria meningitidis NOT DETECTED NOT  DETECTED Final   Pseudomonas aeruginosa NOT DETECTED NOT DETECTED Final   Candida albicans NOT DETECTED NOT DETECTED Final   Candida glabrata NOT DETECTED NOT DETECTED Final   Candida krusei NOT DETECTED NOT DETECTED Final   Candida parapsilosis NOT DETECTED NOT DETECTED Final   Candida tropicalis NOT DETECTED NOT DETECTED Final    Comment: Performed at [facility], [place] [address], [place], [place] [address]  Culture, blood (Routine X 2) w Reflex to ID Panel     Status: None   Collection Time: 04/10/19  4:00 AM   Specimen: BLOOD  Result Value Ref Range Status   Specimen Description BLOOD CENTRAL LINE  Final   Special Requests   Final    BOTTLES DRAWN AEROBIC AND ANAEROBIC Blood Culture adequate volume   Culture   Final    NO GROWTH 5 DAYS Performed at [facility], [place] [address], [place], [place] [address]    Report Status 04/15/2019 FINAL  Final  Culture, respiratory (non-expectorated)     Status: None   Collection Time: 04/11/19 10:20 AM   Specimen: Tracheal Aspirate; Respiratory  Result Value Ref Range Status   Specimen Description TRACHEAL ASPIRATE  Final   Special Requests NONE  Final   Gram Stain   Final    RARE WBC PRESENT,BOTH PMN AND MONONUCLEAR NO ORGANISMS SEEN    Culture   Final    RARE Consistent with normal respiratory flora. Performed at [facility], [place] [address], [place], [place] [address]    Report Status 04/14/2019 FINAL  Final         Radiology Studies: Dg Chest Port 1 View  Result Date: 04/13/2019 CLINICAL DATA:  Hypoxemia, shortness of breath. EXAM: PORTABLE CHEST 1 VIEW COMPARISON:  Radiographs of April 12, 2019. FINDINGS: Stable cardiomegaly. No pneumothorax or pleural effusion is noted. Minimal bibasilar subsegmental atelectasis is noted. Right internal jugular dialysis catheter is unchanged in position. Bony thorax is unremarkable. IMPRESSION: Minimal bibasilar subsegmental atelectasis. Electronically Signed   By: Marijo Conception M.D.   On: 04/13/2019 13:38        Scheduled Meds: . atorvastatin  40 mg Oral Q2000  . Chlorhexidine Gluconate Cloth  6 each Topical Q0600  . Chlorhexidine Gluconate Cloth  6 each Topical Q0600  . darbepoetin (ARANESP) injection - DIALYSIS  150 mcg Intravenous Q Wed-HD  . DULoxetine  30 mg Oral Daily  . feeding supplement (NEPRO CARB STEADY)  237 mL Oral BID BM  . feeding supplement (PRO-STAT SUGAR FREE 64)  30 mL Oral BID  . ferric citrate  420 mg Oral TID WC  . heparin injection (subcutaneous)  5,000 Units Subcutaneous Q8H  . lidocaine  1 application Topical Once  . mouth rinse  15 mL Mouth Rinse BID  . midodrine  10 mg Oral Q M,W,F  . multivitamin  1 tablet Oral QHS  . pantoprazole  40 mg Oral Daily  . saccharomyces boulardii  250 mg Oral BID   Continuous Infusions: . sodium thiosulfate infusion for calciphylaxis Stopped (04/12/19 1744)     LOS: 18 days    Time spent: 35 minutes.     Elmarie Shiley, MD [facility] Pager [phone]  If 7PM-7AM, please  contact night-coverage [email or web address] Password [email or web address] 04/15/2019, 1:11 PM

## 2019-04-15 NOTE — Progress Notes (Addendum)
[facility] Progress Note   Subjective:   Patient seen in room. Reports pain is significantly improved. Denies any issues with HD yesterday. Denies SOB, dyspnea, CP, palpitations, abdominal pain, N/V. Has rectal tube and reports diarrhea.   Objective Vitals:   04/15/19 0436 04/15/19 0804 04/15/19 0855 04/15/19 1139  BP: 139/83 133/71 139/79 135/74  Pulse: 76 80 78 80  Resp: 14 14 14 16   Temp: 99.3 F (37.4 C) 98.8 F (37.1 C)  98.4 F (36.9 C)  TempSrc: Oral Oral  Oral  SpO2: 100% 94% 100%   Weight: 115.2 kg     Height:       Physical Exam General: Alert, well developed, in NAD Heart: RRR, no murmurs, rubs or gallops Lungs: Lungs CTA b/l without wheezing, rhonchi or rales Abdomen: Soft, non-tender, non-distended. + stool in ned Extremities: B/l legs with multiple bandaged calciphylaxis wounds. No edema Dialysis Access: R IJ TDC, L aVF + bruit  Additional Objective Labs: Basic Metabolic Panel: Recent Labs  Lab 04/13/19 0520 04/13/19 1936 04/14/19 0354  NA 135 134* 136  K 3.3* 3.3* 3.3*  CL 98 97* 98  CO2 21* 20* 20*  GLUCOSE 73 74 109*  BUN 18 23* 26*  CREATININE 4.44* 5.52* 6.09*  CALCIUM 8.1* 8.3* 8.6*  PHOS 2.9 3.8 4.8*   Liver Function Tests: Recent Labs  Lab 04/11/19 1001  04/13/19 0520 04/13/19 1936 04/14/19 0354  AST 22  --   --   --   --   ALT 15  --   --   --   --   ALKPHOS 84  --   --   --   --   BILITOT 0.4  --   --   --   --   PROT 7.3  --   --   --   --   ALBUMIN 1.6*   < > 1.6* 1.6* 1.6*   < > = values in this interval not displayed.   No results for input(s): LIPASE, AMYLASE in the last 168 hours. CBC: Recent Labs  Lab 04/12/19 0549 04/12/19 1922 04/13/19 0520 04/13/19 1936 04/14/19 0354 04/15/19 0301  WBC 25.2* 19.9* 20.1* 17.8* 16.5* 13.0*  NEUTROABS 20.0*  --   --   --   --   --   HGB 7.6* 8.1* 7.6* 7.8* 8.0* 8.3*  HCT 27.1* 26.7* 24.7* 25.6* 27.0* 27.5*  MCV 97.1 93.0 91.1 91.4 93.8 92.0  PLT 307 285 304  311 319 309   Blood Culture    Component Value Date/Time   SDES TRACHEAL ASPIRATE 04/11/2019 1020   SPECREQUEST NONE 04/11/2019 1020   CULT  04/11/2019 1020    RARE Consistent with normal respiratory flora. Performed at [facility], [place] [address] [place], [place] [address]    REPTSTATUS 04/14/2019 FINAL 04/11/2019 1020    CBG: Recent Labs  Lab 04/14/19 2033 04/14/19 2359 04/15/19 0438 04/15/19 0803 04/15/19 1137  GLUCAP 192* 134* 137* 107* 136*   Medications: . sodium thiosulfate infusion for calciphylaxis Stopped (04/12/19 1744)   . atorvastatin  40 mg Oral Q2000  . Chlorhexidine Gluconate Cloth  6 each Topical Q0600  . Chlorhexidine Gluconate Cloth  6 each Topical Q0600  . darbepoetin (ARANESP) injection - DIALYSIS  150 mcg Intravenous Q Wed-HD  . DULoxetine  30 mg Oral Daily  . feeding supplement (NEPRO CARB STEADY)  237 mL Oral BID BM  . feeding supplement (PRO-STAT SUGAR FREE 64)  30 mL Oral  BID  . ferric citrate  420 mg Oral TID WC  . heparin injection (subcutaneous)  5,000 Units Subcutaneous Q8H  . lidocaine  1 application Topical Once  . mouth rinse  15 mL Mouth Rinse BID  . midodrine  10 mg Oral Q M,W,F  . multivitamin  1 tablet Oral QHS  . pantoprazole  40 mg Oral Daily  . saccharomyces boulardii  250 mg Oral BID    Dialysis Orders:  MWF 4 hrs 180NRe 400/800 120 kg 2.0 K/ 2.25 Ca(Change to 4.0 K /2.0 Ca bath on DC) -Heparin 4000 units IV TIW -Calcitriol 0.25 mcg PO TIW  Assessment/Plan: 1. Septic shock: Clinically improved.  Antibiotics stopped by primary team/ ID.  CT scan negative for PE.  Blood pressure is stable. Afebrile.  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  Continue low calcium bath, avoid VDRA.  Continue sodium thiosulfate.  Wound care and pain management per primary team. Got just over 2 weeks IV abx from 9/8- 9/23. WBC improving.  Seen by orthopedics, plan to continue  current wound management.  3. ESRD: Initially required serial dialysis, now on MWF schedule. Completed HD yesterday with 4K bath, K+ 3.3 yesterday. Check RFP in the AM, will likely need to continue added K+ bath with HD on Monday.  4. Acute respiratory failure with hypoxia: Initially required mechanical ventilation, now improved. CPAP ordered at night.  5. HTN/volume:   BP stable on midodrine. No evidence of volume overload on exam. Net UF 3L with HD yesterday, tolerated well. Continue volume management with HD.  6. Anemia: Hemoglobin 8.3. Continue aranesp 150 mcg q Wednesday. Follow hemoglobin.  7. Secondary hyperparathyroidism:  Avoid calcium/vit D in setting of calciphylaxis. Phos 4.8. Continue auryxia.  8. Dialysis Access: Patient was scheduled for elective second stage basilic vein transposition on 9/21 which was postponed because of cardiac arrest.  [facility] is following.  Anice Paganini, PA-C 04/15/2019, 3:14 PM  [facility] Pager: [phone]  Pt seen, examined and agree w A/P as above.  Kelly Splinter  MD 04/15/2019, 4:37 PM

## 2019-04-15 NOTE — Progress Notes (Signed)
Daily Progress Note   Patient Name: Michael Riggs       Date: 04/15/2019 DOB: 24-May-1971  Age: 48 y.o. MRN#: [id number] Attending Physician: Elmarie Shiley, MD Primary Care Physician: [name], MD Admit Date: 03/28/2019  Reason for Consultation/Follow-up: Establishing goals of care  Subjective: Talked with patient's SIL Michael Riggs.  She tells me that Michael Riggs had a very bad night and was ready to "give up" this morning.  She encouraged him to hang on to get a kidney transplant.  She states he has "almost PTSD" regarding his previous amputation.  She and the patient's brother are now living in the patients house and caring for his mother (who has dementia).  If the patient is able to transfer on his own from bed to wheel chair and wheel chair to toilet they want him to come home.  Otherwise he will need SNF.  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  I spoke with Michael Riggs at bedside.  He would like for his bother and SIL (Moses and Michael Riggs) to be his joint HCPOA.  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  He wants to get out of the hospital and get a job.   I explained that his family was coming to visit him at 1:00 today and at that time we would fill out HCPOA paper work and talk in detail about his health.   Mr. Dicristina was happy his family was coming in to visit.  Brother (Moses), SIL (Michael Riggs), and Mother arrived.  Family was very concerned that patient's mother not be told just how sick he is as she has dementia and they felt it would upset her.  Patient's mother  only speaks spanish.  Dr. Tyrell Antonio was present, she provided great re-assurance to the patient's mother and answered multiple pointed medical questions posed by Michael Riggs and Michael Riggs.   Afterward we completed a Living Will.   The patient designated his bother Michael Riggs and his SIL Michael Riggs as Land.  We regard to the living will he does not want to receive life support in any of the  scenarios mentioned.  He does not want a feeding tube.  He did give the option for his HCPOA to make decisions that vary from his documented choices.  We discussed his condition and specifically calciphylaxis.  After talking with nephrology I explained to the family that the condition is very painful.  Sometimes we are fortunate and can get it to heal.  Other times we are not fortunate and it worsens or spreads.  I advised the family to give it 3 to 6 months to be able to make a decision.  If after 3-6 months the calciphylaxis is spreading and worse then it is unlikely it will ever improve and decisions should be made with that knowledge in mind.   The family and [name] seemed to understand and accept this.  We talked a lot about oxygen and CPAP.  Mr. Greiwe will quickly become confused when he does not wear oxygen.  He will likely need it on-going.    Assessment:  48 yo gentleman with DM, PVD, depression, ESRD, Calciphylaxis, acute respiratory failure with hypoxia.    Patient Profile/HPI:  48 y.o. male  with past medical history of DM, PVD, Left BKA, who started hemodialysis in July 2020, now with calciphylaxis who was admitted on 03/28/2019 with respiratory failure.  His hospitalization has been complicated by fever, delirium, and cardiac arrest requiring 6 min of CPR on 9/21.  Length of Stay: 18  Current Medications: Scheduled Meds:  . atorvastatin  40 mg Oral Q2000  . Chlorhexidine Gluconate Cloth  6 each Topical Q0600  . Chlorhexidine Gluconate Cloth  6 each Topical Q0600  . darbepoetin (ARANESP) injection -  DIALYSIS  150 mcg Intravenous Q Wed-HD  . DULoxetine  30 mg Oral Daily  . feeding supplement (NEPRO CARB STEADY)  237 mL Oral BID BM  . feeding supplement (PRO-STAT SUGAR FREE 64)  30 mL Oral BID  . ferric citrate  420 mg Oral TID WC  . heparin injection (subcutaneous)  5,000 Units Subcutaneous Q8H  . lidocaine  1 application Topical Once  . mouth rinse  15 mL Mouth Rinse BID  . midodrine  10 mg Oral Q M,W,F  . multivitamin  1 tablet Oral QHS  . pantoprazole  40 mg Oral Daily  . saccharomyces boulardii  250 mg Oral BID    Continuous Infusions: . sodium thiosulfate infusion for calciphylaxis Stopped (04/12/19 1744)    PRN Meds: acetaminophen **OR** acetaminophen, albuterol, bisacodyl, docusate sodium, heparin, hydrocortisone, ketorolac, loperamide, ondansetron **OR** ondansetron (ZOFRAN) IV, oxyCODONE, polyethylene glycol, senna-docusate, traMADol  Physical Exam        Pleasant, obese, chronically ill gentleman.  Family at bedside CV rrr resp no distress with N/C at 3L Abdomen soft, nt, nd LLE Bka.  All extremities cold with poor color.  Vital Signs: BP 139/79   Pulse 78   Temp 98.8 F (37.1 C) (Oral)   Resp 14   Ht 5\' 10"  (1.778 m)   Wt 115.2 kg   SpO2 100%   BMI 36.45 kg/m  SpO2: SpO2: 100 % O2 Device: O2 Device: Room Air O2 Flow Rate: O2 Flow Rate (L/min): 3 L/min  Intake/output summary:   Intake/Output Summary (Last 24 hours) at 04/15/2019 1029 Last data filed at 04/15/2019 0915 Gross per 24 hour  Intake 1680 ml  Output 3000 ml  Net -1320 ml   LBM: Last BM Date: 04/15/19 Baseline Weight: Weight: 120.2 kg Most recent weight: Weight: 115.2 kg       Palliative  Assessment/Data: 30%      Patient Active Problem List   Diagnosis Date Noted  . Palliative care encounter   . Calciphylaxis   . Wound infection 03/28/2019  . Hypertension   . Dyslipidemia   . Diabetes mellitus with peripheral vascular disease ([place])   . Depression   . Physical deconditioning    . Pruritus   . Scrotal pain   . Labile blood pressure   . Scrotal edema   . Status post below-knee amputation of left lower extremity ([place])   . Slow transit constipation   . Leukocytosis   . Sleep disturbance   . Essential hypertension   . Anemia of chronic disease   . Controlled type 2 diabetes mellitus with hyperglycemia, without long-term current use of insulin ([place])   . ESRD on dialysis ([place])   . Urinary retention   . Debility 02/09/2019  . Fluid overload 02/04/2019  . Hyperkalemia 01/30/2019  . ARF (acute renal failure) ([place]) 01/30/2019  . Hypokalemia 01/30/2019  . Hypoglycemia 01/30/2019  . Syphilis 01/30/2019  . Macrocytic anemia 01/30/2019  . End stage renal disease ([place])   . ESRD (end stage renal disease) on dialysis ([place]) 01/2019  . PDR (proliferative diabetic retinopathy) ([place]) 12/14/2013  . Venous hypertension 09/22/2013  . Diabetes mellitus type 2 in obese ([place]) 07/04/2013  . Habitual alcohol use 07/04/2013  . Obesity, Class II, BMI 35-39.9, with comorbidity 07/04/2013    Palliative Care Plan    Recommendations/Plan: Full code, full scope.  No feeding Tube, No Trach, No long term life support, if he is clearly near EOL he would not want life support. Chaplain requested to notarize HCPOA and Living Will.  This document is particularly important for Mr. Freddrick March. Patient still with severe pain but it appears more tolerable. Tramadol added for moderate pain.   After talking to Psychiatry, I dc'd Celexa 40 mg and started Cymbalta 30 mg.  Would titrate Cymbalta up to 60 mg over the next month if he has no adverse effects. Recommend palliative to follow at SNF  Goals of Care and Additional Recommendations: Limitations on Scope of Treatment: Full Scope Treatment, No Artificial Feeding and No Tracheostomy  Code Status:  Full code  Prognosis:  Unable to determine.  Patient with recurrent encephalopathy, decreased PO intake, Calciphylaxis wounds.  It would not be  unexpected for him to die within the year.  He is at high risk of acute decompensation and death as he has suffered rapid decline over the last 3 months.   Discharge Planning: [place] for rehab with Palliative care service follow-up  Care plan was discussed with Family, RN, Dr. Tyrell Antonio.  Thank you for allowing the Palliative Medicine Team to assist in the care of this patient.  Total time spent:  60 min.     Greater than 50%  of this time was spent counseling and coordinating care related to the above assessment and plan.  Florentina Jenny, PA-C Palliative Medicine  Please contact Palliative MedicineTeam phone at [phone] for questions and concerns between 7 am - 7 pm.   Please see AMION for individual provider pager numbers.

## 2019-04-15 NOTE — Plan of Care (Signed)
  Problem: Clinical Measurements: Goal: Ability to maintain clinical measurements within normal limits will improve Outcome: Not Progressing  Pt had slight fever over night at 99 orally.

## 2019-04-16 ENCOUNTER — Encounter (HOSPITAL_COMMUNITY): Payer: Self-pay | Admitting: *Deleted

## 2019-04-16 LAB — RENAL FUNCTION PANEL
Albumin: 1.7 g/dL — ABNORMAL LOW (ref 3.5–5.0)
Anion gap: 19 — ABNORMAL HIGH (ref 5–15)
BUN: 29 mg/dL — ABNORMAL HIGH (ref 6–20)
CO2: 20 mmol/L — ABNORMAL LOW (ref 22–32)
Calcium: 8.5 mg/dL — ABNORMAL LOW (ref 8.9–10.3)
Chloride: 95 mmol/L — ABNORMAL LOW (ref 98–111)
Creatinine, Ser: 6.63 mg/dL — ABNORMAL HIGH (ref 0.61–1.24)
GFR calc Af Amer: 11 mL/min — ABNORMAL LOW (ref >60)
GFR calc non Af Amer: 9 mL/min — ABNORMAL LOW (ref >60)
Glucose, Bld: 114 mg/dL — ABNORMAL HIGH (ref 70–99)
Phosphorus: 4.3 mg/dL (ref 2.5–4.6)
Potassium: 2.9 mmol/L — ABNORMAL LOW (ref 3.5–5.1)
Sodium: 134 mmol/L — ABNORMAL LOW (ref 135–145)

## 2019-04-16 LAB — GLUCOSE, CAPILLARY
Glucose-Capillary: 142 mg/dL — ABNORMAL HIGH (ref 70–99)
Glucose-Capillary: 113 mg/dL — ABNORMAL HIGH (ref 70–99)
Glucose-Capillary: 107 mg/dL — ABNORMAL HIGH (ref 70–99)

## 2019-04-16 MED ORDER — GUAIFENESIN-DM 100-10 MG/5ML PO SYRP
5.0000 mL | ORAL_SOLUTION | ORAL | Status: DC | PRN
  Administered 2019-04-22: 5 mL via ORAL
  Filled 2019-04-16 (×2): qty 5

## 2019-04-16 MED ORDER — CHOLESTYRAMINE 4 G PO PACK
4.0000 g | PACK | Freq: Every day | ORAL | Status: DC
  Administered 2019-04-16: 4 g via ORAL
  Filled 2019-04-16 (×2): qty 1

## 2019-04-16 MED ORDER — GERHARDT'S BUTT CREAM
TOPICAL_CREAM | Freq: Three times a day (TID) | CUTANEOUS | Status: DC | PRN
  Administered 2019-04-17: 1 via TOPICAL
  Administered 2019-04-26: 07:00:00 via TOPICAL
  Filled 2019-04-16: qty 1

## 2019-04-16 MED ORDER — POTASSIUM CHLORIDE CRYS ER 10 MEQ PO TBCR
10.0000 meq | EXTENDED_RELEASE_TABLET | Freq: Once | ORAL | Status: AC
  Administered 2019-04-16: 10 meq via ORAL
  Filled 2019-04-16: qty 1

## 2019-04-16 NOTE — Progress Notes (Signed)
PROGRESS NOTE    Michael Riggs  [name] DOB: [birth year] DOA: 03/28/2019 PCP: [name], MD   Brief Narrative: 48 year old with past medical history significant for end-stage renal disease on hemodialysis TTS, hypertension, hyperlipidemia who was sent from a skilled nursing facility for evaluation of fever, confusion and right lower extremity pain concerning for infection.  Over the last 2 months he had been admitted to the hospital for worsening renal function and fluid overload.  Patient was a started on dialysis and eventually sent to rehab.  This time he was admitted for sepsis secondary to right lower extremity cellulitis with concern for necrotic tissue.  Orthopedic and plastic surgery was consulted they recommend medical management and have Dr. Sharol Given follow once he returns from his vacation. Hospital course complicated by profound hypotension and shock with loss of consciousness and require intubation.  Patient received CPR, required intubation.  Transfer to the ICU.  He was a started on IV pressors subsequently weaned off IV pressors.  He was extubated on 04/11/2019.  Patient continues to have severe lower extremity pain, Dr. Sharol Given evaluated wound on 9/23, notice some improvement. Recommended medical management.   Patient continue to have a lot of pain. He received pain medication overnight and ativan. He is more lethargic this am and confuse. ABG was ordered and showed P02 at  50. Place on oxygen. CCM consulted again. I have ordered CPAP at HS. He wake up some after been place on oxygen. Transfer to step down unit. Will consult palliative care for goals of care.      Assessment & Plan:   Principal Problem:   Wound infection Active Problems:   Hypertension   ESRD (end stage renal disease) on dialysis ([place])   Dyslipidemia   Diabetes mellitus with peripheral vascular disease ([place])   Depression   Physical deconditioning   Calciphylaxis   Palliative care encounter   Goals  of care, counseling/discussion   1-Acute Hypoxic Respiratory Failure; Patient lost consciousness on 9/21, became severely hypotensive.  [facility] was called and he was intubated and received CPR. Patient was extubated on 9/22. He was a started on IV heparin to cover for PE.  Echo with mild dilation of right ventricle. CTA negative for PE. Doppler lower extremity limited status, negative for DVT CT angio negative for PE.  Hypoxemia, worse 9-24. IV fentanyl discontinue. Transfer to step down unit. Place on oxygen, mentation improved.  Place of CPAP at hs.  CCM recommending palliative care consult for goals of care, high risk deterioration. difficult situation with patient in pain, requiring pain medication and resp. status.  CPAP at HS ordered.  Stable on oxygen supplementation.   2-Acute metabolic, toxic. Encephalopathy: related to hypoxemia.  Patient loss of consciousness, might be related to hypotension versus pain medication. worsening hypoxemia 9-24. Place on oxygen.  He is more alert and conversant. Needs to use oxygen. Needs to be cautious with pain medications.   Shock,  Patient requiring IV pressors. Now off of pressors. 1 of 2 blood cultures positive for staph coagulase negative.  Patient was on IV antibiotics since 9/08---Case was discussed with ID and orthopedic. Plan to hold IV antibiotics.  Vitals stable.  WBC trending down   Right lower extremity wound, likely calciphylaxis: He continues to have lot of pain. Dr Sharol Given wants to continue with conservative management at this time. He notice some improvement of the calciphylaxis.  Patient continue to have pain. He gets lethargic and hypoventilation with IV pain medications due to underline  OSA. Palliative care consulted. Appreciate Palliative help.    Chronic normocytic anemia: Continue to monitor closely.  End-stage renal disease on hemodialysis. Per nephrology. Immature fistula getting dialysis through hemodialysis  tunneled catheter.   Nutrition: pass swallow.  Diarrhea: C. difficile test -10 days ago. He denies abdominal pain.  imodium PRN. Improved.  Will add cholestyramine.   Peripheral vascular disease, status post left AKA.  Pressure Injury 02/27/19 Penis WOC evaluation day of PIP data, this wound is trauma related to [facility] insertions and removals and documented abnormal male penile tissue per urology (Active)  02/27/19 1000  Location: Penis  Location Orientation:   Staging:   Wound Description (Comments): WOC evaluation day of PIP data, this wound is trauma related to [facility] insertions and removals and documented abnormal male penile tissue per urology  Present on Admission: No     Nutrition Problem: Increased nutrient needs Etiology: wound healing    Signs/Symptoms: estimated needs    Interventions: MVI, Prostat  Estimated body mass index is 36.45 kg/m as calculated from the following:   Height as of this encounter: 5\' 10"  (1.778 m).   Weight as of this encounter: 115.2 kg.   DVT prophylaxis: On heparin drip Code Status: Full code Family Communication: Family updated 9-26 Disposition Plan: Transfer to the stepdown unit Consultants:   Dr. Sharol Given  Nephrology  CCM  Procedures:   Echo: Normal ejection fraction mild dilated right ventricle  Antimicrobials:  Vancomycin and Zosyn  Subjective: Alert and conversant, report pain is better 8/10 on RLE 9 on the left  Objective: Vitals:   04/16/19 0731 04/16/19 0755 04/16/19 0855 04/16/19 1140  BP: 139/74 (!) 156/106 (!) 146/87 (!) 162/94  Pulse:  74 80 80  Resp:  15 16 18   Temp: 98.7 F (37.1 C)   98.7 F (37.1 C)  TempSrc: Oral   Oral  SpO2:  100% 100% 100%  Weight:      Height:        Intake/Output Summary (Last 24 hours) at 04/16/2019 1358 Last data filed at 04/16/2019 0100 Gross per 24 hour  Intake 417 ml  Output 800 ml  Net -383 ml   Filed Weights   04/12/19 2011 04/14/19 0439 04/15/19 0436  Weight: 115 kg  117.5 kg 115.2 kg    Examination:  General exam: NAD Respiratory system: Decreased breath sounds.  Cardiovascular system: S 1, S 2 RRR Gastrointestinal system: BS present, soft, nt Central nervous system: alert, following command Extremities: S/P AKA, Righ LE with dressing  Skin: necrosis LE    Data Reviewed: I have personally reviewed following labs and imaging studies  CBC: Recent Labs  Lab 04/12/19 0549 04/12/19 1922 04/13/19 0520 04/13/19 1936 04/14/19 0354 04/15/19 0301  WBC 25.2* 19.9* 20.1* 17.8* 16.5* 13.0*  NEUTROABS 20.0*  --   --   --   --   --   HGB 7.6* 8.1* 7.6* 7.8* 8.0* 8.3*  HCT 27.1* 26.7* 24.7* 25.6* 27.0* 27.5*  MCV 97.1 93.0 91.1 91.4 93.8 92.0  PLT 307 285 304 311 319 [id number]   Basic Metabolic Panel: Recent Labs  Lab 04/10/19 0007  04/11/19 1001 04/12/19 0522 04/12/19 1922 04/13/19 0520 04/13/19 1936 04/14/19 0354 04/16/19 1300  NA 130*   < >  --  135 136 135 134* 136 134*  K 4.3   < >  --  3.7 3.2* 3.3* 3.3* 3.3* 2.9*  CL 95*   < >  --  101 99 98 97* 98 95*  CO2 23   < >  --  22 24 21* 20* 20* 20*  GLUCOSE 130*   < >  --  77 80 73 74 109* 114*  BUN 38*   < >  --  36* 15 18 23* 26* 29*  CREATININE 5.54*   < >  --  6.02* 3.46* 4.44* 5.52* 6.09* 6.63*  CALCIUM 8.2*   < >  --  8.1* 7.8* 8.1* 8.3* 8.6* 8.5*  MG 1.7  --  1.8 1.8  --   --   --   --   --   PHOS 3.4   < >  --  5.3* 2.3* 2.9 3.8 4.8* 4.3   < > = values in this interval not displayed.   GFR: Estimated Creatinine Clearance: 17.5 mL/min (A) (by C-G formula based on SCr of 6.63 mg/dL (H)). Liver Function Tests: Recent Labs  Lab 04/11/19 1001  04/12/19 1922 04/13/19 0520 04/13/19 1936 04/14/19 0354 04/16/19 1300  AST 22  --   --   --   --   --   --   ALT 15  --   --   --   --   --   --   ALKPHOS 84  --   --   --   --   --   --   BILITOT 0.4  --   --   --   --   --   --   PROT 7.3  --   --   --   --   --   --   ALBUMIN 1.6*   < > 1.6* 1.6* 1.6* 1.6* 1.7*   < > = values in  this interval not displayed.   No results for input(s): LIPASE, AMYLASE in the last 168 hours. No results for input(s): AMMONIA in the last 168 hours. Coagulation Profile: Recent Labs  Lab 04/10/19 0309 04/11/19 0500 04/12/19 0522 04/13/19 0520  INR 1.6* 1.5* 1.5* 1.6*   Cardiac Enzymes: No results for input(s): CKTOTAL, CKMB, CKMBINDEX, TROPONINI in the last 168 hours. BNP (last 3 results) No results for input(s): PROBNP in the last 8760 hours. HbA1C: No results for input(s): HGBA1C in the last 72 hours. CBG: Recent Labs  Lab 04/15/19 1639 04/15/19 2009 04/16/19 0512 04/16/19 0728 04/16/19 1138  GLUCAP 142* 140* 142* 113* 107*   Lipid Profile: No results for input(s): CHOL, HDL, LDLCALC, TRIG, CHOLHDL, LDLDIRECT in the last 72 hours. Thyroid Function Tests: No results for input(s): TSH, T4TOTAL, FREET4, T3FREE, THYROIDAB in the last 72 hours. Anemia Panel: No results for input(s): VITAMINB12, FOLATE, FERRITIN, TIBC, IRON, RETICCTPCT in the last 72 hours. Sepsis Labs: Recent Labs  Lab 04/10/19 0007 04/10/19 0309 04/12/19 0955 04/13/19 0520 04/14/19 0354  PROCALCITON  --   --  31.65 25.13 19.37  LATICACIDVEN 1.6 1.7  --   --   --     Recent Results (from the past 240 hour(s))  SARS CORONAVIRUS 2 (TAT 6-24 HRS) Nasopharyngeal Nasopharyngeal Swab     Status: None   Collection Time: 04/09/19  3:15 PM   Specimen: Nasopharyngeal Swab  Result Value Ref Range Status   SARS Coronavirus 2 NEGATIVE NEGATIVE Final    Comment: (NOTE) SARS-CoV-2 target nucleic acids are NOT DETECTED. The SARS-CoV-2 RNA is generally detectable in upper and lower respiratory specimens during the acute phase of infection. Negative results do not preclude SARS-CoV-2 infection, do not rule out co-infections with other pathogens, and should not be used  as the sole basis for treatment or other patient management decisions. Negative results must be combined with clinical observations, patient  history, and epidemiological information. The expected result is Negative. Fact Sheet for Patients: SugarRoll.be Fact Sheet for Healthcare Providers: https://www.woods-mathews.com/ This test is not yet approved or cleared by the [place] FDA and  has been authorized for detection and/or diagnosis of SARS-CoV-2 by FDA under an Emergency Use Authorization (EUA). This EUA will remain  in effect (meaning this test can be used) for the duration of the COVID-19 declaration under Section 56 4(b)(1) of the Act, 21 U.S.C. section 360bbb-3(b)(1), unless the authorization is terminated or revoked sooner. Performed at [facility], [place] [address] [place], [place] [address]   MRSA PCR Screening     Status: None   Collection Time: 04/10/19  1:48 AM   Specimen: Nasal Mucosa; Nasopharyngeal  Result Value Ref Range Status   MRSA by PCR NEGATIVE NEGATIVE Final    Comment:        The GeneXpert MRSA Assay (FDA approved for NASAL specimens only), is one component of a comprehensive MRSA colonization surveillance program. It is not intended to diagnose MRSA infection nor to guide or monitor treatment for MRSA infections. Performed at [facility], [place] [address], [place], [place] [address]   Culture, blood (Routine X 2) w Reflex to ID Panel     Status: Abnormal   Collection Time: 04/10/19  2:07 AM   Specimen: BLOOD RIGHT HAND  Result Value Ref Range Status   Specimen Description BLOOD RIGHT HAND  Final   Special Requests   Final    BOTTLES DRAWN AEROBIC AND ANAEROBIC Blood Culture adequate volume   Culture  Setup Time   Final    GRAM POSITIVE COCCI AEROBIC BOTTLE ONLY CRITICAL RESULT CALLED TO, READ BACK BY AND VERIFIED WITH: PHRMD J ORIET @ [id number] 04/11/19 BY S GEZAHEGN    Culture (A)  Final    STAPHYLOCOCCUS SPECIES (COAGULASE NEGATIVE) THE SIGNIFICANCE OF ISOLATING THIS ORGANISM FROM A SINGLE SET OF BLOOD CULTURES WHEN MULTIPLE SETS  ARE DRAWN IS UNCERTAIN. PLEASE NOTIFY THE MICROBIOLOGY DEPARTMENT WITHIN ONE WEEK IF SPECIATION AND SENSITIVITIES ARE REQUIRED. Performed at [facility], [place] [address], [place], [place] [address]    Report Status 04/12/2019 FINAL  Final  Blood Culture ID Panel (Reflexed)     Status: Abnormal   Collection Time: 04/10/19  2:07 AM  Result Value Ref Range Status   Enterococcus species NOT DETECTED NOT DETECTED Final   Listeria monocytogenes NOT DETECTED NOT DETECTED Final   Staphylococcus species DETECTED (A) NOT DETECTED Final    Comment: Methicillin (oxacillin) resistant coagulase negative staphylococcus. Possible blood culture contaminant (unless isolated from more than one blood culture draw or clinical case suggests pathogenicity). No antibiotic treatment is indicated for blood  culture contaminants. CRITICAL RESULT CALLED TO, READ BACK BY AND VERIFIED WITH: PHRMD J ORIET @ 0612 04/11/19 BY S GEZAHEGN    Staphylococcus aureus (BCID) NOT DETECTED NOT DETECTED Final   Methicillin resistance DETECTED (A) NOT DETECTED Final    Comment: CRITICAL RESULT CALLED TO, READ BACK BY AND VERIFIED WITH: PHRMD J ORIET @ [id number] 04/11/19 BY S GEZAHEGN    Streptococcus species NOT DETECTED NOT DETECTED Final   Streptococcus agalactiae NOT DETECTED NOT DETECTED Final   Streptococcus pneumoniae NOT DETECTED NOT DETECTED Final   Streptococcus pyogenes NOT DETECTED NOT DETECTED Final   Acinetobacter baumannii NOT DETECTED NOT DETECTED Final  Enterobacteriaceae species NOT DETECTED NOT DETECTED Final   Enterobacter cloacae complex NOT DETECTED NOT DETECTED Final   Escherichia coli NOT DETECTED NOT DETECTED Final   Klebsiella oxytoca NOT DETECTED NOT DETECTED Final   Klebsiella pneumoniae NOT DETECTED NOT DETECTED Final   Proteus species NOT DETECTED NOT DETECTED Final   Serratia marcescens NOT DETECTED NOT DETECTED Final   Haemophilus influenzae NOT DETECTED NOT DETECTED Final   Neisseria  meningitidis NOT DETECTED NOT DETECTED Final   Pseudomonas aeruginosa NOT DETECTED NOT DETECTED Final   Candida albicans NOT DETECTED NOT DETECTED Final   Candida glabrata NOT DETECTED NOT DETECTED Final   Candida krusei NOT DETECTED NOT DETECTED Final   Candida parapsilosis NOT DETECTED NOT DETECTED Final   Candida tropicalis NOT DETECTED NOT DETECTED Final    Comment: Performed at [facility], [place] [address] [place], [place] [address]  Culture, blood (Routine X 2) w Reflex to ID Panel     Status: None   Collection Time: 04/10/19  4:00 AM   Specimen: BLOOD  Result Value Ref Range Status   Specimen Description BLOOD CENTRAL LINE  Final   Special Requests   Final    BOTTLES DRAWN AEROBIC AND ANAEROBIC Blood Culture adequate volume   Culture   Final    NO GROWTH 5 DAYS Performed at [facility], [place] [address], [place], [place] [address]    Report Status 04/15/2019 FINAL  Final  Culture, respiratory (non-expectorated)     Status: None   Collection Time: 04/11/19 10:20 AM   Specimen: Tracheal Aspirate; Respiratory  Result Value Ref Range Status   Specimen Description TRACHEAL ASPIRATE  Final   Special Requests NONE  Final   Gram Stain   Final    RARE WBC PRESENT,BOTH PMN AND MONONUCLEAR NO ORGANISMS SEEN    Culture   Final    RARE Consistent with normal respiratory flora. Performed at [facility], [place] [address] [place], [place] [address]    Report Status 04/14/2019 FINAL  Final         Radiology Studies: No results found.      Scheduled Meds: . atorvastatin  40 mg Oral Q2000  . Chlorhexidine Gluconate Cloth  6 each Topical Q0600  . cholestyramine  4 g Oral Daily  . darbepoetin (ARANESP) injection - DIALYSIS  150 mcg Intravenous Q Wed-HD  . DULoxetine  30 mg Oral Daily  . feeding supplement (NEPRO CARB STEADY)  237 mL Oral BID BM  . feeding supplement (PRO-STAT SUGAR FREE 64)  30 mL Oral BID  . ferric citrate  420 mg Oral TID WC  . heparin  injection (subcutaneous)  5,000 Units Subcutaneous Q8H  . lidocaine  1 application Topical Once  . mouth rinse  15 mL Mouth Rinse BID  . midodrine  10 mg Oral Q M,W,F  . multivitamin  1 tablet Oral QHS  . pantoprazole  40 mg Oral Daily  . saccharomyces boulardii  250 mg Oral BID   Continuous Infusions: . sodium thiosulfate infusion for calciphylaxis Stopped (04/12/19 1744)     LOS: 19 days    Time spent: 35 minutes.     Elmarie Shiley, MD [facility] Pager [phone]  If 7PM-7AM, please contact night-coverage [email or web address] Password [email or web address] 04/16/2019, 1:58 PM

## 2019-04-16 NOTE — Progress Notes (Addendum)
[facility] Progress Note   Subjective:   Patient seen in room. Feeling well, no new concerns. LLE wound examined, appears to be stable without surrounding erythema/edema. Pain overall improved. Denies SOB, dyspnea, cough, CP, abdominal pain, N/V.   Objective Vitals:   04/16/19 0018 04/16/19 0440 04/16/19 0731 04/16/19 1140  BP: (!) 155/87 (!) 144/86 139/74   Pulse: 75 76    Resp: 13 16    Temp: 97.9 F (36.6 C) 98.7 F (37.1 C) 98.7 F (37.1 C) 98.7 F (37.1 C)  TempSrc: Oral Oral Oral Oral  SpO2: 100% 100%    Weight:      Height:       Physical Exam General: Well developed, alert male in NAD Heart: RRR, no murmurs, rubs or gallops Lungs: CTA b/l without wheezing, rhonchi or rales Abdomen: Soft, non-tender, non-distended. +BS Extremities: L anterior thigh with stable eschar, no drainage or surrounding erythema noted. No peripheral edema.  Dialysis Access: R IJ TDC, L aVF   Additional Objective Labs: Basic Metabolic Panel: Recent Labs  Lab 04/13/19 0520 04/13/19 1936 04/14/19 0354  NA 135 134* 136  K 3.3* 3.3* 3.3*  CL 98 97* 98  CO2 21* 20* 20*  GLUCOSE 73 74 109*  BUN 18 23* 26*  CREATININE 4.44* 5.52* 6.09*  CALCIUM 8.1* 8.3* 8.6*  PHOS 2.9 3.8 4.8*   Liver Function Tests: Recent Labs  Lab 04/11/19 1001  04/13/19 0520 04/13/19 1936 04/14/19 0354  AST 22  --   --   --   --   ALT 15  --   --   --   --   ALKPHOS 84  --   --   --   --   BILITOT 0.4  --   --   --   --   PROT 7.3  --   --   --   --   ALBUMIN 1.6*   < > 1.6* 1.6* 1.6*   < > = values in this interval not displayed.   CBC: Recent Labs  Lab 04/12/19 0549 04/12/19 1922 04/13/19 0520 04/13/19 1936 04/14/19 0354 04/15/19 0301  WBC 25.2* 19.9* 20.1* 17.8* 16.5* 13.0*  NEUTROABS 20.0*  --   --   --   --   --   HGB 7.6* 8.1* 7.6* 7.8* 8.0* 8.3*  HCT 27.1* 26.7* 24.7* 25.6* 27.0* 27.5*  MCV 97.1 93.0 91.1 91.4 93.8 92.0  PLT 307 285 304 311 319 309   Blood Culture     Component Value Date/Time   SDES TRACHEAL ASPIRATE 04/11/2019 1020   SPECREQUEST NONE 04/11/2019 1020   CULT  04/11/2019 1020    RARE Consistent with normal respiratory flora. Performed at [facility], [place] [address] [place], [place] [address]    REPTSTATUS 04/14/2019 FINAL 04/11/2019 1020    CBG: Recent Labs  Lab 04/15/19 1639 04/15/19 2009 04/16/19 0512 04/16/19 0728 04/16/19 1138  GLUCAP 142* 140* 142* 113* 107*    Studies/Results: No results found. Medications: . sodium thiosulfate infusion for calciphylaxis Stopped (04/12/19 1744)   . atorvastatin  40 mg Oral Q2000  . Chlorhexidine Gluconate Cloth  6 each Topical Q0600  . Chlorhexidine Gluconate Cloth  6 each Topical Q0600  . cholestyramine  4 g Oral Daily  . darbepoetin (ARANESP) injection - DIALYSIS  150 mcg Intravenous Q Wed-HD  . DULoxetine  30 mg Oral Daily  . feeding supplement (NEPRO CARB STEADY)  237 mL Oral BID BM  .  feeding supplement (PRO-STAT SUGAR FREE 64)  30 mL Oral BID  . ferric citrate  420 mg Oral TID WC  . heparin injection (subcutaneous)  5,000 Units Subcutaneous Q8H  . lidocaine  1 application Topical Once  . mouth rinse  15 mL Mouth Rinse BID  . midodrine  10 mg Oral Q M,W,F  . multivitamin  1 tablet Oral QHS  . pantoprazole  40 mg Oral Daily  . saccharomyces boulardii  250 mg Oral BID    Dialysis Orders: [facility] MWF 4 hrs 180NRe 400/800 120 kg 2.0 K/ 2.25 Ca -Heparin 4000 units IV TIW -Calcitriol 0.25 mcg PO TIW  Assessment/Plan: 1. Septic shock: Clinically improved. Antibiotics stopped by primary team/ ID. CT scan negative for PE. Blood pressure is stable. Afebrile.  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 Lesions do not appear to be worsening. Continue low calcium bath, avoid VDRA. Continue sodium thiosulfate. Received over 2 weeks IV abx from 9/8- 9/23. WBC improving. Seen by orthopedics, plan to continue current  wound management.  3. ESRD: Initially required serial dialysis, now on MWF schedule. K+ 3.3 but last checked 9/25- will order RFP and plan to use high K bath with HD tomorrow.  4. Acute respiratory failure with hypoxia: Initially required mechanical ventilation, now improved. CPAP ordered at night.  5. HTN/volume:   BP stable on midodrine. No evidence of volume overload on exam. Continue volume management with HD.  6. Anemia: Hemoglobin 8.3. Continue aranesp 150 mcg q Wednesday. Follow hemoglobin.  7. Secondary hyperparathyroidism:  Avoid calcium/vit D in setting of calciphylaxis. Phos 4.8. Continue auryxia.  8. Dialysis Access: Patient was scheduled for elective second stage basilic vein transposition on 9/21 which was postponed because of cardiac arrest. [facility] is following.  Anice Paganini, PA-C 04/16/2019, 12:21 PM  [facility] Pager: [phone]

## 2019-04-17 LAB — CBC
HCT: 31.4 % — ABNORMAL LOW (ref 39.0–52.0)
Hemoglobin: 9.6 g/dL — ABNORMAL LOW (ref 13.0–17.0)
MCH: 28.7 pg (ref 26.0–34.0)
MCHC: 30.6 g/dL (ref 30.0–36.0)
MCV: 93.7 fL (ref 80.0–100.0)
Platelets: 346 10*3/uL (ref 150–400)
RBC: 3.35 MIL/uL — ABNORMAL LOW (ref 4.22–5.81)
RDW: 19.2 % — ABNORMAL HIGH (ref 11.5–15.5)
WBC: 14.8 10*3/uL — ABNORMAL HIGH (ref 4.0–10.5)
nRBC: 0 % (ref 0.0–0.2)

## 2019-04-17 MED ORDER — CHOLESTYRAMINE 4 G PO PACK
4.0000 g | PACK | Freq: Two times a day (BID) | ORAL | Status: DC
  Administered 2019-04-17 – 2019-04-19 (×5): 4 g via ORAL
  Filled 2019-04-17 (×4): qty 1

## 2019-04-17 MED ORDER — OXYCODONE HCL 5 MG PO TABS
ORAL_TABLET | ORAL | Status: AC
  Administered 2019-04-17: 5 mg via ORAL
  Filled 2019-04-17: qty 1

## 2019-04-17 MED ORDER — HEPARIN SODIUM (PORCINE) 1000 UNIT/ML IJ SOLN
INTRAMUSCULAR | Status: AC
  Filled 2019-04-17: qty 4

## 2019-04-17 NOTE — Progress Notes (Signed)
PROGRESS NOTE    Michael Riggs  [name] DOB: 1970-08-01 DOA: 03/28/2019 PCP: [name], MD   Brief Narrative: 48 year old with past medical history significant for end-stage renal disease on hemodialysis TTS, hypertension, hyperlipidemia who was sent from a skilled nursing facility for evaluation of fever, confusion and right lower extremity pain concerning for infection.  Over the last 2 months he had been admitted to the hospital for worsening renal function and fluid overload.  Patient was a started on dialysis and eventually sent to rehab.  This time he was admitted for sepsis secondary to right lower extremity cellulitis with concern for necrotic tissue.  Orthopedic and plastic surgery was consulted they recommend medical management and have Dr. Sharol Given follow once he returns from his vacation. Hospital course complicated by profound hypotension and shock with loss of consciousness and require intubation.  Patient received CPR, required intubation.  Transfer to the ICU.  He was a started on IV pressors subsequently weaned off IV pressors.  He was extubated on 04/11/2019.  Patient continues to have severe lower extremity pain, Dr. Sharol Given evaluated wound on 9/23, notice some improvement. Recommended medical management.   Patient continue to have a lot of pain. He received pain medication overnight and ativan. He is more lethargic this am and confuse. ABG was ordered and showed P02 at  50. Place on oxygen. CCM consulted again. I have ordered CPAP at HS. He wake up some after been place on oxygen. Transfer to step down unit. Will consult palliative care for goals of care.      Assessment & Plan:   Principal Problem:   Wound infection Active Problems:   Hypertension   ESRD (end stage renal disease) on dialysis ([place])   Dyslipidemia   Diabetes mellitus with peripheral vascular disease ([place])   Depression   Physical deconditioning   Calciphylaxis   Palliative care encounter   Goals  of care, counseling/discussion   1-Acute Hypoxic Respiratory Failure; Patient lost consciousness on 9/21, became severely hypotensive.  [facility] was called and he was intubated and received CPR. Patient was extubated on 9/22. He was a started on IV heparin to cover for PE.  Echo with mild dilation of right ventricle. CTA negative for PE. Doppler lower extremity limited status, negative for DVT CT angio negative for PE.  Hypoxemia, worse 9-24. IV fentanyl discontinue. Transfer to step down unit. Place on oxygen, mentation improved.  Place of CPAP at hs.  CCM recommending palliative care consult for goals of care, high risk deterioration. difficult situation with patient in pain, requiring pain medication and resp. status.  CPAP at HS ordered. Ask Care management to help arrange CPAP at discharge Stable on oxygen supplementation.   2-Acute metabolic, toxic. Encephalopathy: related to hypoxemia.  Patient loss of consciousness, might be related to hypotension versus pain medication. worsening hypoxemia 9-24. Place on oxygen.  He is more alert and conversant. Needs to be on  oxygen. Needs to be cautious with pain medications.   Diarrhea: C. difficile test -10 days ago. He denies abdominal pain.  imodium PRN. Adde cholestyramine.  Persist, report incontinence.  Will ask GI to evaluate. Check GI pathogen.   Shock,  Patient requiring IV pressors. Now off of pressors. 1 of 2 blood cultures positive for staph coagulase negative.  Patient was on IV antibiotics since 9/08---Case was discussed with ID and orthopedic. Plan to hold IV antibiotics.  Vitals stable.  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 Dr Sharol Given wants to continue with conservative management at this time. He notice some improvement of the calciphylaxis.  Patient continue to have pain. He gets lethargic and hypoventilation with IV pain medications due to underline  OSA. Palliative care consulted. Appreciate Palliative help.    Chronic normocytic anemia: Continue to monitor closely.  End-stage renal disease on hemodialysis. Per nephrology. Immature fistula getting dialysis through hemodialysis tunneled catheter.   Nutrition: pass swallow.    Peripheral vascular disease, status post left AKA.  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   Weight as of this encounter: 117 kg.   DVT prophylaxis: On heparin drip Code Status: Full code Family Communication: Family updated 9-26 Disposition Plan: Transfer to the stepdown unit Consultants:   Dr. Sharol Given  Nephrology  CCM  Procedures:   Echo: Normal ejection fraction mild dilated right ventricle  Antimicrobials:  Vancomycin and Zosyn  Subjective: Alert, conversant, denies abdominal pain. Complaining pain BL lower extremities, feels dressing right thigh is too tight.   Objective: Vitals:   04/17/19 1400 04/17/19 1430 04/17/19 1500 04/17/19 1600  BP: 130/87 135/83 (!) 144/86 (!) 144/97  Pulse: 80 84 83 81  Resp: 14 14 15 14   Temp:      TempSrc:      SpO2: 98% 98% 100% 100%  Weight:      Height:        Intake/Output Summary (Last 24 hours) at 04/17/2019 1629 Last data filed at 04/17/2019 [id number] Gross per 24  hour  Intake 537 ml  Output 300 ml  Net 237 ml   Filed Weights   04/14/19 0439 04/15/19 0436 04/17/19 1254  Weight: 117.5 kg 115.2 kg 117 kg    Examination:  General exam: NAD, Obesed Respiratory system: CTA  Cardiovascular system: S 1,S 2 RRR Gastrointestinal system: BS presents, soft, mild tender Central nervous system: Alert, following command Extremities: S/P left AKA, right LE with dressing Skin: necrosis LE    Data Reviewed: I have personally reviewed following labs and imaging studies  CBC: Recent Labs  Lab 04/12/19 0549  04/13/19 0520 04/13/19 1936 04/14/19 0354 04/15/19 0301 04/17/19 0843  WBC 25.2*   < > 20.1* 17.8* 16.5* 13.0* 14.8*  NEUTROABS 20.0*  --   --   --   --   --   --   HGB 7.6*   < > 7.6* 7.8* 8.0* 8.3* 9.6*  HCT 27.1*   < > 24.7* 25.6* 27.0* 27.5* 31.4*  MCV 97.1   < > 91.1 91.4 93.8 92.0 93.7  PLT 307   < > 304 311 319 309 346   < > = values in this interval not displayed.   Basic Metabolic Panel: Recent Labs  Lab 04/11/19 1001 04/12/19 0522 04/12/19 1922 04/13/19 0520 04/13/19 1936 04/14/19 0354 04/16/19  1300  NA  --  135 136 135 134* 136 134*  K  --  3.7 3.2* 3.3* 3.3* 3.3* 2.9*  CL  --  101 99 98 97* 98 95*  CO2  --  22 24 21* 20* 20* 20*  GLUCOSE  --  77 80 73 74 109* 114*  BUN  --  36* 15 18 23* 26* 29*  CREATININE  --  6.02* 3.46* 4.44* 5.52* 6.09* 6.63*  CALCIUM  --  8.1* 7.8* 8.1* 8.3* 8.6* 8.5*  MG 1.8 1.8  --   --   --   --   --   PHOS  --  5.3* 2.3* 2.9 3.8 4.8* 4.3   GFR: Estimated Creatinine Clearance: 17.7 mL/min (A) (by C-G formula based on SCr of 6.63 mg/dL (H)). Liver Function Tests: Recent Labs  Lab 04/11/19 1001  04/12/19 1922 04/13/19 0520 04/13/19 1936 04/14/19 0354 04/16/19 1300  AST 22  --   --   --   --   --   --   ALT 15  --   --   --   --   --   --   ALKPHOS 84  --   --   --   --   --   --   BILITOT 0.4  --   --   --   --   --   --   PROT 7.3  --   --   --   --   --   --   ALBUMIN 1.6*   < >  1.6* 1.6* 1.6* 1.6* 1.7*   < > = values in this interval not displayed.   No results for input(s): LIPASE, AMYLASE in the last 168 hours. No results for input(s): AMMONIA in the last 168 hours. Coagulation Profile: Recent Labs  Lab 04/11/19 0500 04/12/19 0522 04/13/19 0520  INR 1.5* 1.5* 1.6*   Cardiac Enzymes: No results for input(s): CKTOTAL, CKMB, CKMBINDEX, TROPONINI in the last 168 hours. BNP (last 3 results) No results for input(s): PROBNP in the last 8760 hours. HbA1C: No results for input(s): HGBA1C in the last 72 hours. CBG: Recent Labs  Lab 04/15/19 1639 04/15/19 2009 04/16/19 0512 04/16/19 0728 04/16/19 1138  GLUCAP 142* 140* 142* 113* 107*   Lipid Profile: No results for input(s): CHOL, HDL, LDLCALC, TRIG, CHOLHDL, LDLDIRECT in the last 72 hours. Thyroid Function Tests: No results for input(s): TSH, T4TOTAL, FREET4, T3FREE, THYROIDAB in the last 72 hours. Anemia Panel: No results for input(s): VITAMINB12, FOLATE, FERRITIN, TIBC, IRON, RETICCTPCT in the last 72 hours. Sepsis Labs: Recent Labs  Lab 04/12/19 0955 04/13/19 0520 04/14/19 0354  PROCALCITON 31.65 25.13 19.37    Recent Results (from the past 240 hour(s))  SARS CORONAVIRUS 2 (TAT 6-24 HRS) Nasopharyngeal Nasopharyngeal Swab     Status: None   Collection Time: 04/09/19  3:15 PM   Specimen: Nasopharyngeal Swab  Result Value Ref Range Status   SARS Coronavirus 2 NEGATIVE NEGATIVE Final    Comment: (NOTE) SARS-CoV-2 target nucleic acids are NOT DETECTED. The SARS-CoV-2 RNA is generally detectable in upper and lower respiratory specimens during the acute phase of infection. Negative results do not preclude SARS-CoV-2 infection, do not rule out co-infections with other pathogens, and should not be used as the sole basis for treatment or other patient management decisions. Negative results must be combined with clinical observations, patient history, and epidemiological information. The  expected result is Negative. Fact Sheet for Patients: SugarRoll.be Fact  Sheet for Healthcare Providers: https://www.woods-mathews.com/ This test is not yet approved or cleared by the [place] FDA and  has been authorized for detection and/or diagnosis of SARS-CoV-2 by FDA under an Emergency Use Authorization (EUA). This EUA will remain  in effect (meaning this test can be used) for the duration of the COVID-19 declaration under Section 56 4(b)(1) of the Act, 21 U.S.C. section 360bbb-3(b)(1), unless the authorization is terminated or revoked sooner. Performed at [facility], [place] [address] [place], [place] [address]   MRSA PCR Screening     Status: None   Collection Time: 04/10/19  1:48 AM   Specimen: Nasal Mucosa; Nasopharyngeal  Result Value Ref Range Status   MRSA by PCR NEGATIVE NEGATIVE Final    Comment:        The GeneXpert MRSA Assay (FDA approved for NASAL specimens only), is one component of a comprehensive MRSA colonization surveillance program. It is not intended to diagnose MRSA infection nor to guide or monitor treatment for MRSA infections. Performed at [facility], [place] [address], [place], [place] [address]   Culture, blood (Routine X 2) w Reflex to ID Panel     Status: Abnormal   Collection Time: 04/10/19  2:07 AM   Specimen: BLOOD RIGHT HAND  Result Value Ref Range Status   Specimen Description BLOOD RIGHT HAND  Final   Special Requests   Final    BOTTLES DRAWN AEROBIC AND ANAEROBIC Blood Culture adequate volume   Culture  Setup Time   Final    GRAM POSITIVE COCCI AEROBIC BOTTLE ONLY CRITICAL RESULT CALLED TO, READ BACK BY AND VERIFIED WITH: PHRMD J ORIET @ [id number] 04/11/19 BY S GEZAHEGN    Culture (A)  Final    STAPHYLOCOCCUS SPECIES (COAGULASE NEGATIVE) THE SIGNIFICANCE OF ISOLATING THIS ORGANISM FROM A SINGLE SET OF BLOOD CULTURES WHEN MULTIPLE SETS ARE DRAWN IS UNCERTAIN. PLEASE NOTIFY THE  MICROBIOLOGY DEPARTMENT WITHIN ONE WEEK IF SPECIATION AND SENSITIVITIES ARE REQUIRED. Performed at [facility], [place] [address] [place], [place] [address]    Report Status 04/12/2019 FINAL  Final  Blood Culture ID Panel (Reflexed)     Status: Abnormal   Collection Time: 04/10/19  2:07 AM  Result Value Ref Range Status   Enterococcus species NOT DETECTED NOT DETECTED Final   Listeria monocytogenes NOT DETECTED NOT DETECTED Final   Staphylococcus species DETECTED (A) NOT DETECTED Final    Comment: Methicillin (oxacillin) resistant coagulase negative staphylococcus. Possible blood culture contaminant (unless isolated from more than one blood culture draw or clinical case suggests pathogenicity). No antibiotic treatment is indicated for blood  culture contaminants. CRITICAL RESULT CALLED TO, READ BACK BY AND VERIFIED WITH: PHRMD J ORIET @ 0612 04/11/19 BY S GEZAHEGN    Staphylococcus aureus (BCID) NOT DETECTED NOT DETECTED Final   Methicillin resistance DETECTED (A) NOT DETECTED Final    Comment: CRITICAL RESULT CALLED TO, READ BACK BY AND VERIFIED WITH: PHRMD J ORIET @ [id number] 04/11/19 BY S GEZAHEGN    Streptococcus species NOT DETECTED NOT DETECTED Final   Streptococcus agalactiae NOT DETECTED NOT DETECTED Final   Streptococcus pneumoniae NOT DETECTED NOT DETECTED Final   Streptococcus pyogenes NOT DETECTED NOT DETECTED Final   Acinetobacter baumannii NOT DETECTED NOT DETECTED Final   Enterobacteriaceae species NOT DETECTED NOT DETECTED Final   Enterobacter cloacae complex NOT DETECTED NOT DETECTED Final   Escherichia coli NOT DETECTED NOT DETECTED Final   Klebsiella oxytoca NOT DETECTED NOT DETECTED Final  Klebsiella pneumoniae NOT DETECTED NOT DETECTED Final   Proteus species NOT DETECTED NOT DETECTED Final   Serratia marcescens NOT DETECTED NOT DETECTED Final   Haemophilus influenzae NOT DETECTED NOT DETECTED Final   Neisseria meningitidis NOT DETECTED NOT DETECTED Final    Pseudomonas aeruginosa NOT DETECTED NOT DETECTED Final   Candida albicans NOT DETECTED NOT DETECTED Final   Candida glabrata NOT DETECTED NOT DETECTED Final   Candida krusei NOT DETECTED NOT DETECTED Final   Candida parapsilosis NOT DETECTED NOT DETECTED Final   Candida tropicalis NOT DETECTED NOT DETECTED Final    Comment: Performed at [facility], [place] [address] [place], [place] [address]  Culture, blood (Routine X 2) w Reflex to ID Panel     Status: None   Collection Time: 04/10/19  4:00 AM   Specimen: BLOOD  Result Value Ref Range Status   Specimen Description BLOOD CENTRAL LINE  Final   Special Requests   Final    BOTTLES DRAWN AEROBIC AND ANAEROBIC Blood Culture adequate volume   Culture   Final    NO GROWTH 5 DAYS Performed at [facility], [place] [address], [place], [place] [address]    Report Status 04/15/2019 FINAL  Final  Culture, respiratory (non-expectorated)     Status: None   Collection Time: 04/11/19 10:20 AM   Specimen: Tracheal Aspirate; Respiratory  Result Value Ref Range Status   Specimen Description TRACHEAL ASPIRATE  Final   Special Requests NONE  Final   Gram Stain   Final    RARE WBC PRESENT,BOTH PMN AND MONONUCLEAR NO ORGANISMS SEEN    Culture   Final    RARE Consistent with normal respiratory flora. Performed at [facility], [place] [address] [place], [place] [address]    Report Status 04/14/2019 FINAL  Final         Radiology Studies: No results found.      Scheduled Meds: . atorvastatin  40 mg Oral Q2000  . Chlorhexidine Gluconate Cloth  6 each Topical Q0600  . cholestyramine  4 g Oral BID  . darbepoetin (ARANESP) injection - DIALYSIS  150 mcg Intravenous Q Wed-HD  . DULoxetine  30 mg Oral Daily  . feeding supplement (NEPRO CARB STEADY)  237 mL Oral BID BM  . feeding supplement (PRO-STAT SUGAR FREE 64)  30 mL Oral BID  . ferric citrate  420 mg Oral TID WC  . heparin injection (subcutaneous)  5,000 Units Subcutaneous  Q8H  . mouth rinse  15 mL Mouth Rinse BID  . midodrine  10 mg Oral Q M,W,F  . multivitamin  1 tablet Oral QHS  . pantoprazole  40 mg Oral Daily  . saccharomyces boulardii  250 mg Oral BID   Continuous Infusions: . sodium thiosulfate infusion for calciphylaxis 25 g (04/17/19 1600)     LOS: 20 days    Time spent: 35 minutes.     Elmarie Shiley, MD [facility] Pager [phone]  If 7PM-7AM, please contact night-coverage [email or web address] Password [facility] 04/17/2019, 4:29 PM

## 2019-04-17 NOTE — Progress Notes (Signed)
Placed patient on CPAP for the night via auto-mode with oxygen set at 2lpm  

## 2019-04-17 NOTE — Progress Notes (Signed)
Physical Therapy Re-Evaluation and Treatment Patient Details Name: Michael Riggs MRN: [id number] DOB: [birth year] Today's Date: 04/17/2019    History of Present Illness Pt adm from SNF with acute hypoxic respiratory failure and metabolic encephalopathy. Patient is a 48 year old male with a complex recent medical history essentially admitted 3 months ago almost continuous stay in the hospital with complications related to renal failure initiation of dialysis.  Intubated from 9/21 to 9/22.  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     PT Comments    Pt agreeable to therapy once he is "cleaned up" despite placement of flexiseal pt reports he is dirty. Pt requires modA for rolling L and R, for clean pad placement. Pt not found to be soiled but has layer of buttock creme that may make it feel like he is dirty. Pt with c/o of pain in bilateral LE but especially in buttocks today. Pt requires maxAx2 and use of bed pad for helicopter transfer to come to [place]. Pt only able to tolerate 4 min sitting EoB due to increased buttock pain. Pt returned to bed with maxAx2. Educated pt on need to sit in wheelchair for dialysis when he goes to SNF. Pt reports he doesn't think he will be able to sit that long anytime soon. D/c plans remain appropriate. PT will continue to follow acutely.    Follow Up Recommendations  SNF     Equipment Recommendations  Other (comment)(TBD at next venue)       Precautions / Restrictions Precautions Precautions: Fall Precaution Comments: L BKA with prosthesis Restrictions Weight Bearing Restrictions: No    Mobility  Bed Mobility Overal bed mobility: Needs Assistance Bed Mobility: Rolling;Supine to Sit;Sit to Sidelying Rolling: Mod assist   Supine to sit: Max assist;+2 for physical assistance;+2 for safety/equipment;HOB elevated   Sit to sidelying: Max assist;+2 for physical assistance;+2 for safety/equipment General bed mobility comments:  modA for pericare, pt with feeling of being soiled, despite flexiseal, determined that the "dirty" feeling he has is from the buttock cream, pt able to utilize bed rails for rolling R and L, utilized bed pad to helicopter transfer supine/sit to decrease shear on bottom. pt able to assist with core activation to bring trunk to upright, pt able to return LE to bed with maxAx2   Transfers                 General transfer comment: Nt this session        Balance Overall balance assessment: Needs assistance Sitting-balance support: Single extremity supported;Bilateral upper extremity supported(RLE supported) Sitting balance-Leahy Scale: Poor Sitting balance - Comments: sat EOB x 4-5 minutes with min guard assist, pt with c/o of increased pain in sitting on his buttocks                                    Cognition Arousal/Alertness: Awake/alert Behavior During Therapy: WFL for tasks assessed/performed Overall Cognitive Status: Within Functional Limits for tasks assessed                                           General Comments General comments (skin integrity, edema, etc.): VSS, both L residual limb and R LE wrapped in clean, dry gauze, pt with c/o dressings being too tight, RN notified. Discussed with pt  the need for him to be able to tolerate sitting in W/c for dialysis in order to go to SNF. Pt replies he doesn't think that he will be able to do that anytime soon.       Pertinent Vitals/Pain Pain Assessment: Faces Faces Pain Scale: Hurts even more Pain Location: buttocks. legs Pain Descriptors / Indicators: Grimacing;Moaning;Sore;Tender Pain Intervention(s): Limited activity within patient's tolerance;Monitored during session;Repositioned    Home Living Family/patient expects to be discharged to:: Skilled nursing facility                    Prior Function Level of Independence: Needs assistance  Gait / Transfers Assistance Needed:  During rehab in August pt was performing bed to chair transfers with rolling walker with min assist. Unsure of pt's function at SNF since that time.       PT Goals (current goals can now be found in the care plan section) Acute Rehab PT Goals Patient Stated Goal: decrease pain PT Goal Formulation: Patient unable to participate in goal setting Time For Goal Achievement: 04/26/19 Potential to Achieve Goals: Fair    Frequency    Min 2X/week      PT Plan  Discharge plans remain appropriate.        AM-PAC PT "6 Clicks" Mobility   Outcome Measure  Help needed turning from your back to your side while in a flat bed without using bedrails?: Total Help needed moving from lying on your back to sitting on the side of a flat bed without using bedrails?: Total Help needed moving to and from a bed to a chair (including a wheelchair)?: Total Help needed standing up from a chair using your arms (e.g., wheelchair or bedside chair)?: Total Help needed to walk in hospital room?: Total Help needed climbing 3-5 steps with a railing? : Total 6 Click Score: 6    End of Session Equipment Utilized During Treatment: Oxygen Activity Tolerance: Patient limited by pain;Patient limited by fatigue Patient left: in bed;with call bell/phone within reach;with bed alarm set Nurse Communication: Mobility status PT Visit Diagnosis: Other abnormalities of gait and mobility (R26.89);Muscle weakness (generalized) (M62.81);Pain Pain - Right/Left: (bilateral ) Pain - part of body: Leg(back)     Time: [id number] PT Time Calculation (min) (ACUTE ONLY): 28 min  Charges:  $Therapeutic Activity: 8-22 mins                     Alylah Blakney B. Migdalia Dk PT, DPT Acute Rehabilitation Services Pager [phone] Office [phone]    [place] 04/17/2019, 10:21 AM

## 2019-04-17 NOTE — Progress Notes (Signed)
Responded to spiritual care consult.  Mr. Michael Riggs has been notarized.

## 2019-04-17 NOTE — Progress Notes (Signed)
Reviewed chart.  Appreciate Chaplain for their assistance in completing HCPOA.  GOALS:  He is full code/full scope but would not want life support if near EOL and would not want feeding tube.  Does not want long term ventilator or trach.  Talked with patient in HD.  He is very sleepy but reports pain is controlled.  Recommendations:  1.  Continue cymbalta.  Titrate up to 40 mg at discharge from hospital 2.  Continue lidocaine jelly on wounds at dressing change 3.  Continue low dose oxycodone. 4.  He is being dialyzed 4 times a week for calciphylaxis 5.  Please have Palliative follow after discharge.  He will need support.    6.  PMT will sign off for now.  Please call us back if we can be of assistance inpatient.  Family understands that if wounds are not healing in 3 months then they will likely not improve - pain will continue to spread and become more severe.  Florentina Jenny, PA-C Palliative Medicine Pager: [phone]  15 min.

## 2019-04-17 NOTE — Progress Notes (Signed)
Placed patient on CPAP for the night via auto-mode with minimum pressure set at 5cm and maximum pressure set at 20cm. Oxygen set at 2lpm with Sp02=95%

## 2019-04-17 NOTE — Progress Notes (Signed)
[facility] Progress Note   Subjective:    Seen in room. Good spirits. Pain controlled.  For HD today.   Objective Vitals:   04/16/19 2355 04/17/19 0005 04/17/19 0428 04/17/19 0724  BP:  129/81 (!) 151/94 (!) 149/90  Pulse: 75 73 70 78  Resp: 19 11 13 14   Temp:    98 F (36.7 C)  TempSrc:    Oral  SpO2: 95% 99% 100% 100%  Weight:      Height:       Physical Exam General: Well developed, alert male in NAD Heart: RRR, no murmurs, rubs or gallops Lungs: CTA b/l without wheezing, rhonchi or rales Abdomen: Soft, non-tender, non-distended. Extremities:  Bilateral leg wounds, bandaged s/p L BKA  Dialysis Access: R IJ TDC, L AVF maturing +bruit   Additional Objective Labs: Basic Metabolic Panel: Recent Labs  Lab 04/13/19 1936 04/14/19 0354 04/16/19 1300  NA 134* 136 134*  K 3.3* 3.3* 2.9*  CL 97* 98 95*  CO2 20* 20* 20*  GLUCOSE 74 109* 114*  BUN 23* 26* 29*  CREATININE 5.52* 6.09* 6.63*  CALCIUM 8.3* 8.6* 8.5*  PHOS 3.8 4.8* 4.3   Liver Function Tests: Recent Labs  Lab 04/11/19 1001  04/13/19 1936 04/14/19 0354 04/16/19 1300  AST 22  --   --   --   --   ALT 15  --   --   --   --   ALKPHOS 84  --   --   --   --   BILITOT 0.4  --   --   --   --   PROT 7.3  --   --   --   --   ALBUMIN 1.6*   < > 1.6* 1.6* 1.7*   < > = values in this interval not displayed.   CBC: Recent Labs  Lab 04/12/19 0549  04/13/19 0520 04/13/19 1936 04/14/19 0354 04/15/19 0301 04/17/19 0843  WBC 25.2*   < > 20.1* 17.8* 16.5* 13.0* 14.8*  NEUTROABS 20.0*  --   --   --   --   --   --   HGB 7.6*   < > 7.6* 7.8* 8.0* 8.3* 9.6*  HCT 27.1*   < > 24.7* 25.6* 27.0* 27.5* 31.4*  MCV 97.1   < > 91.1 91.4 93.8 92.0 93.7  PLT 307   < > 304 311 319 309 346   < > = values in this interval not displayed.   Blood Culture    Component Value Date/Time   SDES TRACHEAL ASPIRATE 04/11/2019 1020   SPECREQUEST NONE 04/11/2019 1020   CULT  04/11/2019 1020    RARE Consistent  with normal respiratory flora. Performed at [facility], [place] [address], [place], [place] [address]    REPTSTATUS 04/14/2019 FINAL 04/11/2019 1020    CBG: Recent Labs  Lab 04/15/19 1639 04/15/19 2009 04/16/19 0512 04/16/19 0728 04/16/19 1138  GLUCAP 142* 140* 142* 113* 107*    Studies/Results: No results found. Medications: . sodium thiosulfate infusion for calciphylaxis Stopped (04/12/19 1744)   . atorvastatin  40 mg Oral Q2000  . Chlorhexidine Gluconate Cloth  6 each Topical Q0600  . cholestyramine  4 g Oral BID  . darbepoetin (ARANESP) injection - DIALYSIS  150 mcg Intravenous Q Wed-HD  . DULoxetine  30 mg Oral Daily  . feeding supplement (NEPRO CARB STEADY)  237 mL Oral BID BM  . feeding supplement (PRO-STAT SUGAR FREE 64)  30  mL Oral BID  . ferric citrate  420 mg Oral TID WC  . heparin injection (subcutaneous)  5,000 Units Subcutaneous Q8H  . mouth rinse  15 mL Mouth Rinse BID  . midodrine  10 mg Oral Q M,W,F  . multivitamin  1 tablet Oral QHS  . pantoprazole  40 mg Oral Daily  . saccharomyces boulardii  250 mg Oral BID    Dialysis Orders: [facility] MWF 4 hrs 180NRe 400/800 120 kg 2.0 K/ 2.25 Ca -Heparin 4000 units IV TIW -Calcitriol 0.25 mcg PO TIW  Assessment/Plan: 1. Shock/Cardiac arrest:  Required intubation/pressor support. Clinically improved. Off pressors. Received antibiotics 9/8-9/23. Per primary.  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 Lesions do not appear to be worsening. Continue low calcium bath, avoid VDRA. Continue sodium thiosulfate, wound care.   ESRD: MWF HD. Back on schedule today. Using added K+ bath. Was receiving serial HD on admission d/t calciphylaxis.  Will add 4th HD on Sat as outpatient.. Plan to continue 4 days/week dialysis as outpatient to improve healing of calciphylaxis wounds.  4. Acute respiratory failure with hypoxia.  Extubated 9/22. Remains on CPAP at night.   5.  HTN/volume:   BP stable on midodrine. No evidence of volume overload on exam. Continue volume management with HD.  6. Anemia: Hemoglobin 9.6. Continue aranesp 150 mcg q Wednesday. 7. Secondary hyperparathyroidism:  Avoid calcium/vit D in setting of calciphylaxis Continue auryxia.  8. Dialysis Access: Patient was scheduled for elective second stage basilic vein transposition on 9/21 which was postponed because of cardiac arrest.  9. GOC - Seen by palliative care 9/26 - Remains full code, full scope of care   Omari Koslosky Larina Earthly PA-C [place] Pager [phone] 04/17/2019,1:22 PM

## 2019-04-17 NOTE — Consult Note (Signed)
Referring Provider: Dr. Tyrell Antonio Primary Care Physician:  [name], MD Primary Gastroenterologist:  Althia Forts  Reason for Consultation:  Diarrhea  HPI: Michael Riggs is a 48 y.o. male with multiple medical problems as stated below who was admitted for a wound infection and managed for septic shock being seen for a consult due to diarrhea. Denies diarrhea prior to admit but during his admission he has been having profuse nonbloody diarrhea and has a rectal tube in place with 800 cc reported from it on 04/15/19 and 300 cc on 04/16/19. He currently is on Cholestyramine 4 g PO QD that was started yesterday. Also on Florastor 250 mg BID. Denies abdominal pain, melena, or hematochezia. Denies any previous colonoscopy. C. Diff negative on 04/02/19. GI pathogen panel pending. Complains of irritation of skin perianally.  Past Medical History:  Diagnosis Date  . Anemia   . Depression   . Diabetes mellitus with peripheral vascular disease ([place])   . Dyslipidemia   . Dyspnea   . ESRD (end stage renal disease) on dialysis ([place]) 01/2019   MWF  . GERD (gastroesophageal reflux disease)   . Hypertension   . Physical deconditioning   . Syphilis 01/2019    Past Surgical History:  Procedure Laterality Date  . AV FISTULA PLACEMENT Left 01/09/2019   Procedure: ARTERIOVENOUS (AV) FISTULA CREATION LEFT UPPER ARM;  Surgeon: [name], MD;  Location: [facility] OR;  Service: Vascular;  Laterality: Left;  . below the knee amputation Left   . EYE SURGERY    . IR FLUORO GUIDE CV LINE RIGHT  01/31/2019  . IR FLUORO GUIDE CV LINE RIGHT  02/03/2019  . IR US GUIDE VASC ACCESS RIGHT  01/31/2019  . IR US GUIDE VASC ACCESS RIGHT  02/03/2019    Prior to Admission medications   Medication Sig Start Date End Date Taking? Authorizing Provider  acetaminophen (TYLENOL) 325 MG tablet Take 2 tablets (650 mg total) by mouth every 6 (six) hours as needed for mild pain (or Fever >/= 101). 03/13/19  Yes Angiulli, Lavon Paganini, PA-C   albuterol (VENTOLIN HFA) 108 (90 Base) MCG/ACT inhaler Inhale 2 puffs into the lungs every 6 (six) hours as needed for wheezing or shortness of breath.   Yes [provider]  atorvastatin (LIPITOR) 40 MG tablet Take 1 tablet (40 mg total) by mouth daily. 03/13/19  Yes Angiulli, Lavon Paganini, PA-C  bethanechol (URECHOLINE) 25 MG tablet Take 1 tablet (25 mg total) by mouth 3 (three) times daily. 03/13/19  Yes Angiulli, Lavon Paganini, PA-C  bisacodyl (DULCOLAX) 5 MG EC tablet Take 10 mg by mouth daily.   Yes [provider]  citalopram (CELEXA) 40 MG tablet Take 40 mg by mouth daily.  12/13/16  Yes [provider]  furosemide (LASIX) 80 MG tablet Take 1 tablet (80 mg total) by mouth daily. 03/13/19  Yes Angiulli, Lavon Paganini, PA-C  gabapentin (NEURONTIN) 300 MG capsule Take 1 capsule (300 mg total) by mouth at bedtime. Patient taking differently: Take 200 mg by mouth 3 (three) times daily.  02/08/19  Yes Alma Friendly, MD  Lidocaine-Prilocaine &Lido HCl 2.5-2.5 & 3.88 % KIT Apply 1 application topically every Monday, Wednesday, and Friday. Apply to fistula and wrap with saran wrap site prior to dialysys   Yes [provider]  Melatonin 3 MG TABS Take 1.5 tablets (4.5 mg total) by mouth at bedtime. 02/08/19  Yes Alma Friendly, MD  methocarbamol (ROBAXIN) 500 MG tablet Take 1 tablet (500 mg  total) by mouth every 8 (eight) hours as needed for muscle spasms. 03/13/19  Yes Angiulli, Lavon Paganini, PA-C  oxyCODONE (OXY IR/ROXICODONE) 5 MG immediate release tablet Take 5 mg by mouth 3 (three) times daily. Take an additional 5 mg every 8 hours as needed.   Yes [provider]  pantoprazole (PROTONIX) 40 MG tablet Take 1 tablet (40 mg total) by mouth daily. 03/13/19  Yes Angiulli, Lavon Paganini, PA-C  polyethylene glycol (MIRALAX / GLYCOLAX) 17 g packet Take 17 g by mouth daily. Patient taking differently: Take 17 g by mouth daily as needed for mild constipation or moderate  constipation. 4-8 ounces 02/09/19  Yes Alma Friendly, MD  tamsulosin [facility]) 0.4 MG CAPS capsule Take 1 capsule (0.4 mg total) by mouth daily after breakfast. 03/13/19  Yes Angiulli, Lavon Paganini, PA-C    Scheduled Meds: . atorvastatin  40 mg Oral Q2000  . Chlorhexidine Gluconate Cloth  6 each Topical Q0600  . cholestyramine  4 g Oral Daily  . darbepoetin (ARANESP) injection - DIALYSIS  150 mcg Intravenous Q Wed-HD  . DULoxetine  30 mg Oral Daily  . feeding supplement (NEPRO CARB STEADY)  237 mL Oral BID BM  . feeding supplement (PRO-STAT SUGAR FREE 64)  30 mL Oral BID  . ferric citrate  420 mg Oral TID WC  . heparin injection (subcutaneous)  5,000 Units Subcutaneous Q8H  . mouth rinse  15 mL Mouth Rinse BID  . midodrine  10 mg Oral Q M,W,F  . multivitamin  1 tablet Oral QHS  . pantoprazole  40 mg Oral Daily  . saccharomyces boulardii  250 mg Oral BID   Continuous Infusions: . sodium thiosulfate infusion for calciphylaxis Stopped (04/12/19 1744)   PRN Meds:.acetaminophen **OR** acetaminophen, albuterol, bisacodyl, docusate sodium, Gerhardt's butt cream, guaiFENesin-dextromethorphan, hydrocortisone, ketorolac, loperamide, ondansetron **OR** ondansetron (ZOFRAN) IV, oxyCODONE, traMADol  Allergies as of 03/28/2019  . (No Known Allergies)    History reviewed. No pertinent family history.  Social History   Socioeconomic History  . Marital status: Divorced    Spouse name: Not on file  . Number of children: Not on file  . Years of education: Not on file  . Highest education level: Not on file  Occupational History  . Not on file  Social Needs  . Financial resource strain: Not on file  . Food insecurity    Worry: Not on file    Inability: Not on file  . Transportation needs    Medical: Not on file    Non-medical: Not on file  Tobacco Use  . Smoking status: Former Smoker    Types: Cigarettes    Start date: 11/18/2018    Quit date: 01/18/2019    Years since quitting: 0.2  .  Smokeless tobacco: Never Used  . Tobacco comment: pt stated got bored with it  Substance and Sexual Activity  . Alcohol use: Not Currently    Comment: heavy drinker in the past, none since 07/19/18  . Drug use: Not Currently  . Sexual activity: Not Currently  Lifestyle  . Physical activity    Days per week: Not on file    Minutes per session: Not on file  . Stress: Not on file  Relationships  . Social Herbalist on phone: Not on file    Gets together: Not on file    Attends religious service: Not on file    Active member of club or organization: Not on file  Attends meetings of clubs or organizations: Not on file    Relationship status: Not on file  . Intimate partner violence    Fear of current or ex partner: Not on file    Emotionally abused: Not on file    Physically abused: Not on file    Forced sexual activity: Not on file  Other Topics Concern  . Not on file  Social History Narrative  . Not on file    Review of Systems: All negative except as stated above in HPI.  Physical Exam: Vital signs: Vitals:   04/17/19 0428 04/17/19 0724  BP: (!) 151/94 (!) 149/90  Pulse: 70 78  Resp: 13 14  Temp:  98 F (36.7 C)  SpO2: 100% 100%   Last BM Date: 04/16/19 General:  Lethargic, obese, pleasant, no acute distress  Head: normocephalic, atraumatic Eyes: anicteric sclera ENT: oropharynx clear Neck: supple, nontender Lungs:  Clear throughout to auscultation.   No wheezes, crackles, or rhonchi. No acute distress. Heart:  Regular rate and rhythm; no murmurs, clicks, rubs,  or gallops. Abdomen: soft, nontender, nondistended, +BS, obese  Rectal:  Deferred Ext: L BKA, R - no edema (wrapped in dressing)  GI:  Lab Results: Recent Labs    04/15/19 0301 04/17/19 0843  WBC 13.0* 14.8*  HGB 8.3* 9.6*  HCT 27.5* 31.4*  PLT 309 346   BMET Recent Labs    04/16/19 1300  NA 134*  K 2.9*  CL 95*  CO2 20*  GLUCOSE 114*  BUN 29*  CREATININE 6.63*  CALCIUM  8.5*   LFT Recent Labs    04/16/19 1300  ALBUMIN 1.7*   PT/INR No results for input(s): LABPROT, INR in the last 72 hours.   Studies/Results: No results found.  Impression/Plan: Diarrhea - nonbloody in setting of recent septic shock; Volume has decreased since 04/15/19. Reported irritation of perianal area likely due to rectal tube but in order to get an accurate measure of how much diarrhea he is having would leave it in for now if he can tolerate it. Will increase Cholestyramine dose to 4 g PO BID and if tolerates increase to 8 g PO BID on 04/19/19 and follow with supportive care. Hold off on flex sig for now and see if volume continues to decline on higher dose of Cholestyramine. Will follow.    LOS: 20 days   Lear Ng  04/17/2019, 10:21 AM  Questions please call [phone]

## 2019-04-18 LAB — CBC
HCT: 29.3 % — ABNORMAL LOW (ref 39.0–52.0)
Hemoglobin: 8.9 g/dL — ABNORMAL LOW (ref 13.0–17.0)
MCH: 28.3 pg (ref 26.0–34.0)
MCHC: 30.4 g/dL (ref 30.0–36.0)
MCV: 93.3 fL (ref 80.0–100.0)
Platelets: 342 10*3/uL (ref 150–400)
RBC: 3.14 MIL/uL — ABNORMAL LOW (ref 4.22–5.81)
RDW: 19.4 % — ABNORMAL HIGH (ref 11.5–15.5)
WBC: 12.7 10*3/uL — ABNORMAL HIGH (ref 4.0–10.5)
nRBC: 0 % (ref 0.0–0.2)

## 2019-04-18 LAB — GLUCOSE, CAPILLARY: Glucose-Capillary: 107 mg/dL — ABNORMAL HIGH (ref 70–99)

## 2019-04-18 MED ORDER — MELATONIN 3 MG PO TABS
3.0000 mg | ORAL_TABLET | Freq: Every evening | ORAL | Status: DC | PRN
  Administered 2019-04-19 – 2019-04-21 (×3): 3 mg via ORAL
  Filled 2019-04-18 (×5): qty 1

## 2019-04-18 NOTE — Progress Notes (Signed)
[facility] Progress Note  Michael Riggs 48 y.o. 02-29-1971   Subjective: Ongoing diarrhea (he feels like it is worse).   Objective: Vital signs: Vitals:   04/18/19 1154 04/18/19 1157  BP: (!) 148/93   Pulse:    Resp:    Temp:  97.8 F (36.6 C)  SpO2:    P 89  Physical Exam: Gen: lethargic, obese, no acute distress  HEENT: anicteric sclera CV: RRR Chest: CTA B Abd: diffuse tenderness with guarding, nondistended, obese, +BS   Lab Results: Recent Labs    04/16/19 1300  NA 134*  K 2.9*  CL 95*  CO2 20*  GLUCOSE 114*  BUN 29*  CREATININE 6.63*  CALCIUM 8.5*  PHOS 4.3   Recent Labs    04/16/19 1300  ALBUMIN 1.7*   Recent Labs    04/17/19 0843 04/18/19 0347  WBC 14.8* 12.7*  HGB 9.6* 8.9*  HCT 31.4* 29.3*  MCV 93.7 93.3  PLT 346 342      Assessment/Plan: Diarrhea - 200 cc reported from rectal tube which is less than the day prior (300 cc). GI pathogen panel pending. Continue Cholestyramine will increase to 8 g PO BID tomorrow. Supportive care.   Michael Riggs 04/18/2019, 11:58 AM  Questions please call [phone] ID: Michael Riggs, male   DOB: Jan 02, 1971, 48 y.o.   MRN: [id number]

## 2019-04-18 NOTE — Progress Notes (Signed)
RT adjusted CPAP settings per patient comfort and bled 2L oxygen into circuit. Patient is tolerating CPAP well at this time. RT will monitor as needed.

## 2019-04-18 NOTE — Progress Notes (Addendum)
[facility] Progress Note   Subjective:    Completed HD yesterday with 2.7L UF. Has breakthrough pain at end of treatment.  Concerned about increased stooling, RN aware   Objective Vitals:   04/17/19 1934 04/17/19 2206 04/18/19 0550 04/18/19 0741  BP: 140/83   (!) 157/97  Pulse:  88  89  Resp:  20  10  Temp: 98.3 F (36.8 C)  97.9 F (36.6 C) 98.6 F (37 C)  TempSrc: Oral  Oral Oral  SpO2:  97%    Weight:      Height:       Physical Exam General: Well developed, alert male in NAD Heart: RRR, no murmurs, rubs or gallops Lungs: CTA b/l without wheezing, rhonchi or rales Abdomen: Soft, non-tender, non-distended. Extremities:  Bilateral leg wounds, bandaged s/p L BKA  Dialysis Access: R IJ TDC, L AVF maturing +bruit   Additional Objective Labs: Basic Metabolic Panel: Recent Labs  Lab 04/13/19 1936 04/14/19 0354 04/16/19 1300  NA 134* 136 134*  K 3.3* 3.3* 2.9*  CL 97* 98 95*  CO2 20* 20* 20*  GLUCOSE 74 109* 114*  BUN 23* 26* 29*  CREATININE 5.52* 6.09* 6.63*  CALCIUM 8.3* 8.6* 8.5*  PHOS 3.8 4.8* 4.3   Liver Function Tests: Recent Labs  Lab 04/11/19 1001  04/13/19 1936 04/14/19 0354 04/16/19 1300  AST 22  --   --   --   --   ALT 15  --   --   --   --   ALKPHOS 84  --   --   --   --   BILITOT 0.4  --   --   --   --   PROT 7.3  --   --   --   --   ALBUMIN 1.6*   < > 1.6* 1.6* 1.7*   < > = values in this interval not displayed.   CBC: Recent Labs  Lab 04/12/19 0549  04/13/19 1936 04/14/19 0354 04/15/19 0301 04/17/19 0843 04/18/19 0347  WBC 25.2*   < > 17.8* 16.5* 13.0* 14.8* 12.7*  NEUTROABS 20.0*  --   --   --   --   --   --   HGB 7.6*   < > 7.8* 8.0* 8.3* 9.6* 8.9*  HCT 27.1*   < > 25.6* 27.0* 27.5* 31.4* 29.3*  MCV 97.1   < > 91.4 93.8 92.0 93.7 93.3  PLT 307   < > 311 319 309 346 342   < > = values in this interval not displayed.   Blood Culture    Component Value Date/Time   SDES TRACHEAL ASPIRATE 04/11/2019 1020   SPECREQUEST NONE 04/11/2019 1020   CULT  04/11/2019 1020    RARE Consistent with normal respiratory flora. Performed at [facility], [place] [address], [place], [place] [address]    REPTSTATUS 04/14/2019 FINAL 04/11/2019 1020    CBG: Recent Labs  Lab 04/15/19 2009 04/16/19 0512 04/16/19 0728 04/16/19 1138 04/18/19 0736  GLUCAP 140* 142* 113* 107* 107*    Studies/Results: No results found. Medications: . sodium thiosulfate infusion for calciphylaxis 25 g (04/17/19 1600)   . atorvastatin  40 mg Oral Q2000  . Chlorhexidine Gluconate Cloth  6 each Topical Q0600  . cholestyramine  4 g Oral BID  . darbepoetin (ARANESP) injection - DIALYSIS  150 mcg Intravenous Q Wed-HD  . DULoxetine  30 mg Oral Daily  . feeding supplement (NEPRO CARB STEADY)  237  mL Oral BID BM  . feeding supplement (PRO-STAT SUGAR FREE 64)  30 mL Oral BID  . ferric citrate  420 mg Oral TID WC  . heparin injection (subcutaneous)  5,000 Units Subcutaneous Q8H  . mouth rinse  15 mL Mouth Rinse BID  . midodrine  10 mg Oral Q M,W,F  . multivitamin  1 tablet Oral QHS  . pantoprazole  40 mg Oral Daily  . saccharomyces boulardii  250 mg Oral BID    Dialysis Orders: [place] MWF 4 hrs 180NRe 400/800 120 kg 2.0 K/ 2.25 Ca -Heparin 4000 units IV TIW -Calcitriol 0.25 mcg PO TIW  Assessment/Plan: 1. Shock/Cardiac arrest:  Required intubation/pressor support. Clinically improved. Off pressors. Received antibiotics 9/8-9/23. Per primary.  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 Lesions do not appear to be worsening. Continue low calcium bath, avoid VDRA. Continue sodium thiosulfate, wound care.   ESRD: MWF HD. Next HD 9/30.  Using added K+ bath. Was receiving serial HD on admission d/t calciphylaxis.  Will add 4th HD on Sat as outpatient.. Plan to continue 4 days/week dialysis as outpatient to improve healing of calciphylaxis wounds.  4. Acute respiratory  failure with hypoxia.  Extubated 9/22. Remains on supp oxygen. CPAP at night.   5. HTN/volume:   BP stable on midodrine. No evidence of volume overload on exam. Continue volume management with HD.  6. Anemia: Hemoglobin 9.6. Continue aranesp 150 mcg q Wednesday. 7. Secondary hyperparathyroidism:  Avoid calcium/vit D in setting of calciphylaxis Continue auryxia.  8. Dialysis Access: Patient was scheduled for elective second stage basilic vein transposition on 9/21 which was postponed because of cardiac arrest.  9. GOC - Seen by palliative care 9/26 - Remains full code, full scope of care   Lynnda Child PA-C [place] Pager [phone] 04/18/2019,9:04 AM

## 2019-04-18 NOTE — Progress Notes (Signed)
PROGRESS NOTE    Michael Riggs  [name] DOB: 1971/02/01 DOA: 03/28/2019 PCP: [name], MD   Brief Narrative: 48 year old with past medical history significant for end-stage renal disease on hemodialysis TTS, hypertension, hyperlipidemia who was sent from a skilled nursing facility for evaluation of fever, confusion and right lower extremity pain concerning for infection.  Over the last 2 months he had been admitted to the hospital for worsening renal function and fluid overload.  Patient was a started on dialysis and eventually sent to rehab.  This time he was admitted for sepsis secondary to right lower extremity cellulitis with concern for necrotic tissue.  Orthopedic and plastic surgery was consulted they recommend medical management and have Dr. Sharol Given follow once he returns from his vacation. Hospital course complicated by profound hypotension and shock with loss of consciousness and require intubation.  Patient received CPR, required intubation.  Transfer to the ICU.  He was a started on IV pressors subsequently weaned off IV pressors.  He was extubated on 04/11/2019.  Patient continues to have severe lower extremity pain, Dr. Sharol Given evaluated wound on 9/23, notice some improvement. Recommended medical management.   Patient continue to have a lot of pain. He received pain medication overnight and ativan. He became more lethargic and confuse. ABG was ordered and showed P02 at  50. Place on oxygen. CCM consulted again. I have ordered CPAP at HS. He wake up some after been place on oxygen. Transfer to step down unit. Palliative care for goals of care.  Patient remain full code, full scope of care. He wouldn't want to be trach or G tube.   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 Pain better manage with oral pain medications. Diarrhea persist but improving. He will required SNF.     Assessment & Plan:   Principal Problem:   Wound infection Active Problems:  Hypertension   ESRD (end stage renal disease) on dialysis ([place])   Dyslipidemia   Diabetes mellitus with peripheral vascular disease ([place])   Depression   Physical deconditioning   Calciphylaxis   Palliative care encounter   Goals of care, counseling/discussion   1-Acute Hypoxic Respiratory Failure; Patient lost consciousness on 9/21, became severely hypotensive.  CODE BLUE was called and he was intubated and received CPR. Patient was extubated on 9/22. He was a started on IV heparin to cover for PE.  Echo with mild dilation of right ventricle. CTA negative for PE. Per nephrology ok to proceed with CTA.  Doppler lower extremity limited status, negative for DVT Hypoxemia, worse 9-24. IV fentanyl discontinue. Transfer to step down unit. Place on oxygen, mentation improved.  Place of CPAP at hs.  CCM recommending palliative care consult for goals of care, high risk deterioration. difficult situation with patient in pain, requiring pain medication and resp. status.  CPAP at HS ordered. Ask Care management to help arrange CPAP at discharge Stable on oxygen supplementation.   2-Acute metabolic, toxic. Encephalopathy: related to hypoxemia.  Patient loss of consciousness, might be related to hypotension versus pain medication. worsening hypoxemia 9-24. Place on oxygen.  He is more alert and conversant. Needs to be on  oxygen. Needs to be cautious with pain medications.   Diarrhea: C. difficile test -10 days ago. He denies abdominal pain.  imodium PRN. Adde cholestyramine.  Persist, report incontinence.  GI pathogen pending.  Appreciate Dr Michail Sermon help.  Shock,  Patient requiring IV pressors. Now off of pressors. 1 of 2 blood cultures positive for  staph coagulase negative.  Patient was on IV antibiotics since 9/08---Case was discussed with ID and orthopedic. Plan to hold IV antibiotics.  Vitals stable.  WBC trending down   Right lower extremity wound, likely calciphylaxis: He  continues to have lot of pain. Dr Sharol Given wants to continue with conservative management at this time. He notice some improvement of the calciphylaxis.  Patient continue to have pain. He gets lethargic and hypoventilation with IV pain medications due to underline OSA. Palliative care consulted. Appreciate Palliative help.   Chronic normocytic anemia: Continue to monitor closely.  End-stage renal disease on hemodialysis. Per nephrology. Immature fistula getting dialysis through hemodialysis tunneled catheter.   Nutrition: pass swallow.    Peripheral vascular disease, status post left AKA.  Pressure Injury 02/27/19 Penis WOC evaluation day of PIP data, this wound is trauma related to [facility] insertions and removals and documented abnormal male penile tissue per urology (Active)  02/27/19 1000  Location: Penis  Location Orientation:   Staging:   Wound Description (Comments): WOC evaluation day of PIP data, this wound is trauma related to [facility] insertions and removals and documented abnormal male penile tissue per urology  Present on Admission: No     Nutrition Problem: Increased nutrient needs Etiology: wound healing    Signs/Symptoms: estimated needs    Interventions: MVI, Prostat  Estimated body mass index is 35.74 kg/m as calculated from the following:   Height as of this encounter: 5\' 10"  (1.778 m).   Weight as of this encounter: 113 kg.   DVT prophylaxis: On heparin drip Code Status: Full code Family Communication: Family updated 9-26 Disposition Plan: Transfer to the stepdown unit Consultants:   Dr. Sharol Given  Nephrology  CCM  Procedures:   Echo: Normal ejection fraction mild dilated right ventricle  Antimicrobials:  Vancomycin and Zosyn  Subjective: He continue to complaint of pain LE. Diarrhea persist.   Objective: Vitals:   04/17/19 1934 04/17/19 2206 04/18/19 0550 04/18/19 0741  BP: 140/83   (!) 157/97  Pulse:  88  89  Resp:  20  10  Temp: 98.3 F (36.8  C)  97.9 F (36.6 C) 98.6 F (37 C)  TempSrc: Oral  Oral Oral  SpO2:  97%    Weight:      Height:        Intake/Output Summary (Last 24 hours) at 04/18/2019 0855 Last data filed at 04/18/2019 [id number] Gross per 24 hour  Intake 1557 ml  Output 3202 ml  Net -1645 ml   Filed Weights   04/15/19 0436 04/17/19 1254 04/17/19 1710  Weight: 115.2 kg 117 kg 113 kg    Examination:  General exam: NAD, Obesed Respiratory system: CTA Cardiovascular system: S 1, S 2 RRR Gastrointestinal system: BS present, soft, nt Central nervous system: Alert, following command Extremities: S/P Left AKA, Right LE with clean dressing.  Skin: necrosis LE    Data Reviewed: I have personally reviewed following labs and imaging studies  CBC: Recent Labs  Lab 04/12/19 0549  04/13/19 1936 04/14/19 0354 04/15/19 0301 04/17/19 0843 04/18/19 0347  WBC 25.2*   < > 17.8* 16.5* 13.0* 14.8* 12.7*  NEUTROABS 20.0*  --   --   --   --   --   --   HGB 7.6*   < > 7.8* 8.0* 8.3* 9.6* 8.9*  HCT 27.1*   < > 25.6* 27.0* 27.5* 31.4* 29.3*  MCV 97.1   < > 91.4 93.8 92.0 93.7 93.3  PLT 307   < >  311 319 309 346 342   < > = values in this interval not displayed.   Basic Metabolic Panel: Recent Labs  Lab 04/11/19 1001  04/12/19 0522 04/12/19 1922 04/13/19 0520 04/13/19 1936 04/14/19 0354 04/16/19 1300  NA  --    < > 135 136 135 134* 136 134*  K  --    < > 3.7 3.2* 3.3* 3.3* 3.3* 2.9*  CL  --    < > 101 99 98 97* 98 95*  CO2  --    < > 22 24 21* 20* 20* 20*  GLUCOSE  --    < > 77 80 73 74 109* 114*  BUN  --    < > 36* 15 18 23* 26* 29*  CREATININE  --    < > 6.02* 3.46* 4.44* 5.52* 6.09* 6.63*  CALCIUM  --    < > 8.1* 7.8* 8.1* 8.3* 8.6* 8.5*  MG 1.8  --  1.8  --   --   --   --   --   PHOS  --    < > 5.3* 2.3* 2.9 3.8 4.8* 4.3   < > = values in this interval not displayed.   GFR: Estimated Creatinine Clearance: 17.3 mL/min (A) (by C-G formula based on SCr of 6.63 mg/dL (H)). Liver Function Tests: Recent  Labs  Lab 04/11/19 1001  04/12/19 1922 04/13/19 0520 04/13/19 1936 04/14/19 0354 04/16/19 1300  AST 22  --   --   --   --   --   --   ALT 15  --   --   --   --   --   --   ALKPHOS 84  --   --   --   --   --   --   BILITOT 0.4  --   --   --   --   --   --   PROT 7.3  --   --   --   --   --   --   ALBUMIN 1.6*   < > 1.6* 1.6* 1.6* 1.6* 1.7*   < > = values in this interval not displayed.   No results for input(s): LIPASE, AMYLASE in the last 168 hours. No results for input(s): AMMONIA in the last 168 hours. Coagulation Profile: Recent Labs  Lab 04/12/19 0522 04/13/19 0520  INR 1.5* 1.6*   Cardiac Enzymes: No results for input(s): CKTOTAL, CKMB, CKMBINDEX, TROPONINI in the last 168 hours. BNP (last 3 results) No results for input(s): PROBNP in the last 8760 hours. HbA1C: No results for input(s): HGBA1C in the last 72 hours. CBG: Recent Labs  Lab 04/15/19 2009 04/16/19 0512 04/16/19 0728 04/16/19 1138 04/18/19 0736  GLUCAP 140* 142* 113* 107* 107*   Lipid Profile: No results for input(s): CHOL, HDL, LDLCALC, TRIG, CHOLHDL, LDLDIRECT in the last 72 hours. Thyroid Function Tests: No results for input(s): TSH, T4TOTAL, FREET4, T3FREE, THYROIDAB in the last 72 hours. Anemia Panel: No results for input(s): VITAMINB12, FOLATE, FERRITIN, TIBC, IRON, RETICCTPCT in the last 72 hours. Sepsis Labs: Recent Labs  Lab 04/12/19 0955 04/13/19 0520 04/14/19 0354  PROCALCITON 31.65 25.13 19.37    Recent Results (from the past 240 hour(s))  SARS CORONAVIRUS 2 (TAT 6-24 HRS) Nasopharyngeal Nasopharyngeal Swab     Status: None   Collection Time: 04/09/19  3:15 PM   Specimen: Nasopharyngeal Swab  Result Value Ref Range Status   SARS Coronavirus 2 NEGATIVE NEGATIVE Final  Comment: (NOTE) SARS-CoV-2 target nucleic acids are NOT DETECTED. The SARS-CoV-2 RNA is generally detectable in upper and lower respiratory specimens during the acute phase of infection. Negative results do  not preclude SARS-CoV-2 infection, do not rule out co-infections with other pathogens, and should not be used as the sole basis for treatment or other patient management decisions. Negative results must be combined with clinical observations, patient history, and epidemiological information. The expected result is Negative. Fact Sheet for Patients: SugarRoll.be Fact Sheet for Healthcare Providers: https://www.woods-mathews.com/ This test is not yet approved or cleared by the [place] FDA and  has been authorized for detection and/or diagnosis of SARS-CoV-2 by FDA under an Emergency Use Authorization (EUA). This EUA will remain  in effect (meaning this test can be used) for the duration of the COVID-19 declaration under Section 56 4(b)(1) of the Act, 21 U.S.C. section 360bbb-3(b)(1), unless the authorization is terminated or revoked sooner. Performed at [facility], [place] [address] [place], [place] [address]   MRSA PCR Screening     Status: None   Collection Time: 04/10/19  1:48 AM   Specimen: Nasal Mucosa; Nasopharyngeal  Result Value Ref Range Status   MRSA by PCR NEGATIVE NEGATIVE Final    Comment:        The GeneXpert MRSA Assay (FDA approved for NASAL specimens only), is one component of a comprehensive MRSA colonization surveillance program. It is not intended to diagnose MRSA infection nor to guide or monitor treatment for MRSA infections. Performed at [facility], [place] [address], [place], [place] [address]   Culture, blood (Routine X 2) w Reflex to ID Panel     Status: Abnormal   Collection Time: 04/10/19  2:07 AM   Specimen: BLOOD RIGHT HAND  Result Value Ref Range Status   Specimen Description BLOOD RIGHT HAND  Final   Special Requests   Final    BOTTLES DRAWN AEROBIC AND ANAEROBIC Blood Culture adequate volume   Culture  Setup Time   Final    GRAM POSITIVE COCCI AEROBIC BOTTLE ONLY CRITICAL RESULT  CALLED TO, READ BACK BY AND VERIFIED WITH: PHRMD J ORIET @ [id number] 04/11/19 BY S GEZAHEGN    Culture (A)  Final    STAPHYLOCOCCUS SPECIES (COAGULASE NEGATIVE) THE SIGNIFICANCE OF ISOLATING THIS ORGANISM FROM A SINGLE SET OF BLOOD CULTURES WHEN MULTIPLE SETS ARE DRAWN IS UNCERTAIN. PLEASE NOTIFY THE MICROBIOLOGY DEPARTMENT WITHIN ONE WEEK IF SPECIATION AND SENSITIVITIES ARE REQUIRED. Performed at [facility], [place] [address], [place], [place] [address]    Report Status 04/12/2019 FINAL  Final  Blood Culture ID Panel (Reflexed)     Status: Abnormal   Collection Time: 04/10/19  2:07 AM  Result Value Ref Range Status   Enterococcus species NOT DETECTED NOT DETECTED Final   Listeria monocytogenes NOT DETECTED NOT DETECTED Final   Staphylococcus species DETECTED (A) NOT DETECTED Final    Comment: Methicillin (oxacillin) resistant coagulase negative staphylococcus. Possible blood culture contaminant (unless isolated from more than one blood culture draw or clinical case suggests pathogenicity). No antibiotic treatment is indicated for blood  culture contaminants. CRITICAL RESULT CALLED TO, READ BACK BY AND VERIFIED WITH: PHRMD J ORIET @ 0612 04/11/19 BY S GEZAHEGN    Staphylococcus aureus (BCID) NOT DETECTED NOT DETECTED Final   Methicillin resistance DETECTED (A) NOT DETECTED Final    Comment: CRITICAL RESULT CALLED TO, READ BACK BY AND VERIFIED WITH: PHRMD J ORIET @ 0612 04/11/19 BY S GEZAHEGN  Streptococcus species NOT DETECTED NOT DETECTED Final   Streptococcus agalactiae NOT DETECTED NOT DETECTED Final   Streptococcus pneumoniae NOT DETECTED NOT DETECTED Final   Streptococcus pyogenes NOT DETECTED NOT DETECTED Final   Acinetobacter baumannii NOT DETECTED NOT DETECTED Final   Enterobacteriaceae species NOT DETECTED NOT DETECTED Final   Enterobacter cloacae complex NOT DETECTED NOT DETECTED Final   Escherichia coli NOT DETECTED NOT DETECTED Final   Klebsiella oxytoca NOT DETECTED NOT  DETECTED Final   Klebsiella pneumoniae NOT DETECTED NOT DETECTED Final   Proteus species NOT DETECTED NOT DETECTED Final   Serratia marcescens NOT DETECTED NOT DETECTED Final   Haemophilus influenzae NOT DETECTED NOT DETECTED Final   Neisseria meningitidis NOT DETECTED NOT DETECTED Final   Pseudomonas aeruginosa NOT DETECTED NOT DETECTED Final   Candida albicans NOT DETECTED NOT DETECTED Final   Candida glabrata NOT DETECTED NOT DETECTED Final   Candida krusei NOT DETECTED NOT DETECTED Final   Candida parapsilosis NOT DETECTED NOT DETECTED Final   Candida tropicalis NOT DETECTED NOT DETECTED Final    Comment: Performed at [facility], [place] [address] [place], [place] [address]  Culture, blood (Routine X 2) w Reflex to ID Panel     Status: None   Collection Time: 04/10/19  4:00 AM   Specimen: BLOOD  Result Value Ref Range Status   Specimen Description BLOOD CENTRAL LINE  Final   Special Requests   Final    BOTTLES DRAWN AEROBIC AND ANAEROBIC Blood Culture adequate volume   Culture   Final    NO GROWTH 5 DAYS Performed at [facility], [place] [address], [place], [place] [address]    Report Status 04/15/2019 FINAL  Final  Culture, respiratory (non-expectorated)     Status: None   Collection Time: 04/11/19 10:20 AM   Specimen: Tracheal Aspirate; Respiratory  Result Value Ref Range Status   Specimen Description TRACHEAL ASPIRATE  Final   Special Requests NONE  Final   Gram Stain   Final    RARE WBC PRESENT,BOTH PMN AND MONONUCLEAR NO ORGANISMS SEEN    Culture   Final    RARE Consistent with normal respiratory flora. Performed at [facility], [place] [address] [place], [place] [address]    Report Status 04/14/2019 FINAL  Final         Radiology Studies: No results found.      Scheduled Meds: . atorvastatin  40 mg Oral Q2000  . Chlorhexidine Gluconate Cloth  6 each Topical Q0600  . cholestyramine  4 g Oral BID  . darbepoetin (ARANESP) injection -  DIALYSIS  150 mcg Intravenous Q Wed-HD  . DULoxetine  30 mg Oral Daily  . feeding supplement (NEPRO CARB STEADY)  237 mL Oral BID BM  . feeding supplement (PRO-STAT SUGAR FREE 64)  30 mL Oral BID  . ferric citrate  420 mg Oral TID WC  . heparin injection (subcutaneous)  5,000 Units Subcutaneous Q8H  . mouth rinse  15 mL Mouth Rinse BID  . midodrine  10 mg Oral Q M,W,F  . multivitamin  1 tablet Oral QHS  . pantoprazole  40 mg Oral Daily  . saccharomyces boulardii  250 mg Oral BID   Continuous Infusions: . sodium thiosulfate infusion for calciphylaxis 25 g (04/17/19 1600)     LOS: 21 days    Time spent: 35 minutes.     Elmarie Shiley, MD [facility] Pager [phone]  If 7PM-7AM, please contact night-coverage [email or web address]  Password [email or web address] 04/18/2019, 8:55 AM

## 2019-04-18 NOTE — Progress Notes (Signed)
  Speech Language Pathology Treatment: Dysphagia  Patient Details Name: Michael Riggs MRN: [id number] DOB: 1970/08/31 Today's Date: 04/18/2019 Time: 2909-0301 SLP Time Calculation (min) (ACUTE ONLY): 14 min  Assessment / Plan / Recommendation Clinical Impression  F/u for dysphagia - pt initially with impaired mental status at time of initial evaluation. Mentation was primary impact on swallowing safety.  Today he presents with notable improvements.  He demonstrates adequate mastication of regular solids, brisk swallow response, no s/s of aspiration.  He is on renal diet, liquids are limited and pt is unhappy with the food choices. Dysphagia is resolved. No further SLP f/ u needed - will sign off.   HPI HPI: Patient is a 48 year old male with a complex recent medical history essentially admitted 3 months ago almost continuous stay in the hospital with complications related to renal failure initiation of dialysis. Intubated from 9/21 to 9/22.       SLP Plan  All goals met       Recommendations  Diet recommendations: Regular;Thin liquid Liquids provided via: Cup;Straw Medication Administration: Whole meds with liquid Supervision: Staff to assist with self feeding Postural Changes and/or Swallow Maneuvers: Seated upright 90 degrees                Oral Care Recommendations: Oral care BID SLP Visit Diagnosis: Dysphagia, unspecified (R13.10) Plan: All goals met       GO               Eimi Viney L. Tivis Ringer, [place] CCC/SLP Acute Rehabilitation Services Office number [phone] Pager [phone]  Juan Quam Laurice 04/18/2019, 4:14 PM

## 2019-04-19 LAB — CBC
HCT: 27.5 % — ABNORMAL LOW (ref 39.0–52.0)
Hemoglobin: 8.3 g/dL — ABNORMAL LOW (ref 13.0–17.0)
MCH: 28.4 pg (ref 26.0–34.0)
MCHC: 30.2 g/dL (ref 30.0–36.0)
MCV: 94.2 fL (ref 80.0–100.0)
Platelets: 344 10*3/uL (ref 150–400)
RBC: 2.92 MIL/uL — ABNORMAL LOW (ref 4.22–5.81)
RDW: 19.3 % — ABNORMAL HIGH (ref 11.5–15.5)
WBC: 13.4 10*3/uL — ABNORMAL HIGH (ref 4.0–10.5)
nRBC: 0 % (ref 0.0–0.2)

## 2019-04-19 LAB — RENAL FUNCTION PANEL
Albumin: 1.7 g/dL — ABNORMAL LOW (ref 3.5–5.0)
Anion gap: 18 — ABNORMAL HIGH (ref 5–15)
BUN: 36 mg/dL — ABNORMAL HIGH (ref 6–20)
CO2: 21 mmol/L — ABNORMAL LOW (ref 22–32)
Calcium: 8.6 mg/dL — ABNORMAL LOW (ref 8.9–10.3)
Chloride: 93 mmol/L — ABNORMAL LOW (ref 98–111)
Creatinine, Ser: 6.23 mg/dL — ABNORMAL HIGH (ref 0.61–1.24)
GFR calc Af Amer: 11 mL/min — ABNORMAL LOW (ref >60)
GFR calc non Af Amer: 10 mL/min — ABNORMAL LOW (ref >60)
Glucose, Bld: 124 mg/dL — ABNORMAL HIGH (ref 70–99)
Phosphorus: 4.4 mg/dL (ref 2.5–4.6)
Potassium: 3.9 mmol/L (ref 3.5–5.1)
Sodium: 132 mmol/L — ABNORMAL LOW (ref 135–145)

## 2019-04-19 LAB — HEPATITIS B SURFACE ANTIGEN: Hepatitis B Surface Ag: NONREACTIVE

## 2019-04-19 MED ORDER — CHOLESTYRAMINE 4 G PO PACK
8.0000 g | PACK | Freq: Two times a day (BID) | ORAL | Status: DC
  Administered 2019-04-19: 8 g via ORAL
  Filled 2019-04-19 (×2): qty 2

## 2019-04-19 MED ORDER — SODIUM CHLORIDE 0.9 % IV SOLN: 100.0000 mL | INTRAVENOUS | Status: DC | PRN

## 2019-04-19 MED ORDER — CHOLESTYRAMINE 4 G PO PACK: 4.0000 g | PACK | Freq: Once | ORAL | Status: DC

## 2019-04-19 MED ORDER — LIDOCAINE-PRILOCAINE 2.5-2.5 % EX CREA: 1.0000 "application " | TOPICAL_CREAM | CUTANEOUS | Status: DC | PRN

## 2019-04-19 MED ORDER — ALTEPLASE 2 MG IJ SOLR: 2.0000 mg | Freq: Once | INTRAMUSCULAR | Status: DC | PRN

## 2019-04-19 MED ORDER — PENTAFLUOROPROP-TETRAFLUOROETH EX AERO: 1.0000 "application " | INHALATION_SPRAY | CUTANEOUS | Status: DC | PRN

## 2019-04-19 MED ORDER — LIDOCAINE HCL (PF) 1 % IJ SOLN: 5.0000 mL | INTRAMUSCULAR | Status: DC | PRN

## 2019-04-19 MED ORDER — DARBEPOETIN ALFA 150 MCG/0.3ML IJ SOSY
PREFILLED_SYRINGE | INTRAMUSCULAR | Status: AC
  Filled 2019-04-19: qty 0.3

## 2019-04-19 MED ORDER — HEPARIN SODIUM (PORCINE) 1000 UNIT/ML DIALYSIS: 1000.0000 [IU] | INTRAMUSCULAR | Status: DC | PRN

## 2019-04-19 MED ORDER — HEPARIN SODIUM (PORCINE) 1000 UNIT/ML DIALYSIS: 4000.0000 [IU] | Freq: Once | INTRAMUSCULAR | Status: DC

## 2019-04-19 MED ORDER — HEPARIN SODIUM (PORCINE) 1000 UNIT/ML IJ SOLN
INTRAMUSCULAR | Status: AC
  Filled 2019-04-19: qty 4

## 2019-04-19 MED ORDER — HEPARIN SODIUM (PORCINE) 1000 UNIT/ML DIALYSIS: 20.0000 [IU]/kg | INTRAMUSCULAR | Status: DC | PRN

## 2019-04-19 NOTE — Progress Notes (Signed)
Nutrition Follow-up  DOCUMENTATION CODES:   Morbid obesity  INTERVENTION:   -Nepro Shake po BID, each supplement provides 425 kcal and 19 grams protein -Prostat liquid protein PO 30 ml BID with meals, each supplement provides 100 kcal, 15 grams protein. -Rena-Vit daily  NUTRITION DIAGNOSIS:   Increased nutrient needs related to wound healing as evidenced by estimated needs.  Ongoing.  GOAL:   Patient will meet greater than or equal to 90% of their needs  Progressing.  MONITOR:   PO intake, Supplement acceptance, Labs, Weight trends, I & O's, Skin  ASSESSMENT:   48 year old with history of ESRD on TTS HD, hypertension, hyperlipidemia sent from SNF for evaluation of fever, confusion right lower extremity pain concerning for infection.  9/21- cardiac arrest, intubated 9/22- extubated  9/24- diet advanced to renal/CHO modified  **RD working remotely**  Per palliative care note on 9/26, pt to remain full code.  Per SLP note on 9/29, dysphagia has resolved.  Patient at HD at this time.  Currently consuming 90-100% of meals at this time. Pt is drinking Nepro and Prostat supplements.  EDW: 120 kg Admission weight: 271 lbs. Current weight: 266 lbs. After last HD on 9/28, pt weighed 249 lbs.   I/Os: +4.3L since 9/16 Last HD 9/28: 2702 ml net UF  Medications: Rena-Vit tablet daily,  Labs reviewed: Low Na  Diet Order:   Diet Order            Diet renal/carb modified with fluid restriction Diet-HS Snack? Nothing; Fluid restriction: 1200 mL Fluid; Room service appropriate? Yes; Fluid consistency: Thin  Diet effective now              EDUCATION NEEDS:   No education needs have been identified at this time  Skin:  Skin Assessment: Reviewed RN Assessment Skin Integrity Issues:: Other (Comment) Other: R leg/R pretibial wound, L leg lesion (calciphylaxis), pressure injury penis  Last BM:  9/30  Height:   Ht Readings from Last 1 Encounters:  04/10/19 5\' 10"   (1.778 m)    Weight:   Wt Readings from Last 1 Encounters:  04/19/19 121 kg    Ideal Body Weight:  71 kg(Adjusted for L BKA)  BMI:  Body mass index is 38.28 kg/m.  Estimated Nutritional Needs:   Kcal:  2200-2400 kcal  Protein:  110-130 grams  Fluid:  1000 ml + UOP  Clayton Bibles, MS, RD, LDN Inpatient Clinical Dietitian Pager: [phone] After Hours Pager: [phone]

## 2019-04-19 NOTE — Progress Notes (Signed)
[facility] Progress Note   Subjective:    Seen in room. Alert, RN changed dsg at bedside. Tolerated, pain controlled.   Objective Vitals:   04/19/19 1011 04/19/19 1030 04/19/19 1100 04/19/19 1130  BP: (!) 150/90 (!) 143/90 (!) 148/87 130/81  Pulse: 78 79 80 89  Resp: 17 13  15   Temp:      TempSrc:      SpO2:      Weight:      Height:       Physical Exam General: Well developed, alert male in NAD Heart: RRR, no murmurs, rubs or gallops Lungs: CTA b/l without wheezing, rhonchi or rales Abdomen: Soft, non-tender, non-distended. Extremities:  Bilateral leg wounds, Extensive wounds w eschar.  See images in media folder (updated 9/30) Dialysis Access: R IJ TDC, L AVF maturing +bruit   Additional Objective Labs: Basic Metabolic Panel: Recent Labs  Lab 04/14/19 0354 04/16/19 1300 04/19/19 1021  NA 136 134* 132*  K 3.3* 2.9* 3.9  CL 98 95* 93*  CO2 20* 20* 21*  GLUCOSE 109* 114* 124*  BUN 26* 29* 36*  CREATININE 6.09* 6.63* 6.23*  CALCIUM 8.6* 8.5* 8.6*  PHOS 4.8* 4.3 4.4   Liver Function Tests: Recent Labs  Lab 04/14/19 0354 04/16/19 1300 04/19/19 1021  ALBUMIN 1.6* 1.7* 1.7*   CBC: Recent Labs  Lab 04/14/19 0354 04/15/19 0301 04/17/19 0843 04/18/19 0347 04/19/19 1021  WBC 16.5* 13.0* 14.8* 12.7* 13.4*  HGB 8.0* 8.3* 9.6* 8.9* 8.3*  HCT 27.0* 27.5* 31.4* 29.3* 27.5*  MCV 93.8 92.0 93.7 93.3 94.2  PLT 319 309 346 342 344   Blood Culture    Component Value Date/Time   SDES TRACHEAL ASPIRATE 04/11/2019 1020   SPECREQUEST NONE 04/11/2019 1020   CULT  04/11/2019 1020    RARE Consistent with normal respiratory flora. Performed at [facility], [place] [address] [place], [place] [address]    REPTSTATUS 04/14/2019 FINAL 04/11/2019 1020    CBG: Recent Labs  Lab 04/15/19 2009 04/16/19 0512 04/16/19 0728 04/16/19 1138 04/18/19 0736  GLUCAP 140* 142* 113* 107* 107*    Studies/Results: No results found. Medications: . [START  ON 04/20/2019] sodium chloride    . [START ON 04/20/2019] sodium chloride    . sodium thiosulfate infusion for calciphylaxis 25 g (04/17/19 1600)   . atorvastatin  40 mg Oral Q2000  . Chlorhexidine Gluconate Cloth  6 each Topical Q0600  . cholestyramine  4 g Oral Once  . cholestyramine  8 g Oral BID  . darbepoetin (ARANESP) injection - DIALYSIS  150 mcg Intravenous Q Wed-HD  . DULoxetine  30 mg Oral Daily  . feeding supplement (NEPRO CARB STEADY)  237 mL Oral BID BM  . feeding supplement (PRO-STAT SUGAR FREE 64)  30 mL Oral BID  . ferric citrate  420 mg Oral TID WC  . [START ON 04/20/2019] heparin  4,000 Units Dialysis Once in dialysis  . heparin injection (subcutaneous)  5,000 Units Subcutaneous Q8H  . mouth rinse  15 mL Mouth Rinse BID  . midodrine  10 mg Oral Q M,W,F  . multivitamin  1 tablet Oral QHS  . pantoprazole  40 mg Oral Daily  . saccharomyces boulardii  250 mg Oral BID    Dialysis Orders: [facility] MWF 4 hrs 180NRe 400/800 120 kg 2.0 K/ 2.25 Ca -Heparin 4000 units IV TIW -Calcitriol 0.25 mcg PO TIW  Assessment/Plan: 1. Shock/Cardiac arrest:  Required intubation/pressor support. Clinically improved. Off pressors. Received  antibiotics 9/8-9/23. Per primary.  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 Lesions do not appear to be worsening. Continue low calcium bath, avoid VDRA. Continue sodium thiosulfate, wound care.   ESRD: MWF HD. HD today.  Using added K+ bath. Was receiving serial HD on admission d/t calciphylaxis.  Will add 4th HD on Sat as outpatient.. Plan to continue 4 days/week dialysis as outpatient to improve healing of calciphylaxis wounds.  4. Acute respiratory failure with hypoxia.  Extubated 9/22. Remains on supp oxygen. CPAP at night.   5. HTN/volume:   BP stable on midodrine. No evidence of volume overload on exam. Continue volume management with HD.  6. Anemia: Hemoglobin 8.3 . Continue aranesp 150 mcg q  Wednesday. 7. Secondary hyperparathyroidism:  Avoid calcium/vit D in setting of calciphylaxis Continue auryxia.  8. Dialysis Access: Patient was scheduled for elective second stage basilic vein transposition on 9/21 which was postponed because of cardiac arrest.  9. GOC - Seen by palliative care 9/26 - Remains full code, full scope of care   Rhyann Berton Larina Earthly PA-C [facility] Pager [phone] 04/19/2019,12:17 PM

## 2019-04-19 NOTE — Progress Notes (Signed)
[facility] Progress Note  Tasman Edelman 48 y.o. [birth year]   Subjective: Feels like diarrhea is less. Denies abdominal pain, nausea, or vomiting.  Objective: Vital signs: Vitals:   04/18/19 2345 04/19/19 0805  BP:  (!) 146/90  Pulse:  82  Resp:  16  Temp: 97.9 F (36.6 C) 97.6 F (36.4 C)  SpO2:  100%    Physical Exam: Gen: lethargic, no acute distress, obese, pleasant HEENT: anicteric sclera CV: RRR Chest: CTA B Abd: diffuse tenderness with guarding, soft, nondistended, obese Ext: no edema  Lab Results: Recent Labs    04/16/19 1300  NA 134*  K 2.9*  CL 95*  CO2 20*  GLUCOSE 114*  BUN 29*  CREATININE 6.63*  CALCIUM 8.5*  PHOS 4.3   Recent Labs    04/16/19 1300  ALBUMIN 1.7*   Recent Labs    04/17/19 0843 04/18/19 0347  WBC 14.8* 12.7*  HGB 9.6* 8.9*  HCT 31.4* 29.3*  MCV 93.7 93.3  PLT 346 342      Assessment/Plan: Diarrhea - slowly improving but unable to objectively assess whether amount of stool has decreased because rectal tube bag has not been emptied per nursing. Will have bag changed to get an assessment of whether volume of stool is decreasing. Stools are loose dark green. Increase Cholestyramine to 8 g PO BID. I do NOT think a flex sig is warranted at this time and he does not want that done even if it was indicated per his response to me today regarding the possibility of doing it in the near future.   Lear Ng 04/19/2019, 10:12 AM  Questions please call [phone] ID: [place] Stiggers, male   DOB: 07-03-1971, 48 y.o.   MRN: [id number]

## 2019-04-19 NOTE — Progress Notes (Signed)
PROGRESS NOTE    Michael Riggs  [name] DOB: 05-24-1971 DOA: 03/28/2019 PCP: [name], MD   Brief Narrative: 48 year old with past medical history significant for end-stage renal disease on hemodialysis TTS, hypertension, hyperlipidemia who was sent from a skilled nursing facility for evaluation of fever, confusion and right lower extremity pain concerning for infection.  Over the last 2 months he had been admitted to the hospital for worsening renal function and fluid overload.  Patient was a started on dialysis and eventually sent to rehab.  This time he was admitted for sepsis secondary to right lower extremity cellulitis with concern for necrotic tissue.  Orthopedic and plastic surgery was consulted they recommend medical management and have Dr. Sharol Given follow. Hospital course complicated by profound hypotension and shock with loss of consciousness and require intubation.  Patient received CPR, required intubation.  Transfer to the ICU.  He was a started on IV pressors subsequently weaned off IV pressors.  He was extubated on 04/11/2019. Patient continue to have a lot of pain with intermittent lethargy and confusion likely due to pain medication. Palliative care, for goals of care.  Patient remain full code, full scope of care. He wouldn't want to be trach or G tube. 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 Pain better manage with oral pain medications.    Assessment & Plan:   Principal Problem:   Wound infection Active Problems:   Hypertension   ESRD (end stage renal disease) on dialysis ([place])   Dyslipidemia   Diabetes mellitus with peripheral vascular disease ([place])   Depression   Physical deconditioning   Calciphylaxis   Palliative care encounter   Goals of care, counseling/discussion    Acute Hypoxic Respiratory Failure Improving Patient lost consciousness on 9/21, became severely hypotensive.  CODE BLUE was called and he was intubated and received  CPR. Patient was extubated on 9/22 CTA chest negative for acute PE or thoracic aortic dissection, small bilateral pleural effusions right greater than left with dependent atelectasis/consolidation in both lung bases PCCM consulted, recommending palliative care consult for goals of care, high risk deterioration. difficult situation with patient in pain, requiring pain medication and resp. status.  CPAP at HS ordered, continue supplemental oxygen PRN  Acute metabolic/toxic encephalopathy Improving Multifactorial, likely due to above in addition to pain medication Monitor closely  Diarrhea Afebrile, with fluctuating leukocytosis C. difficile panel negative, GI panel pending Continue imodium PRN, cholestyramine GI consulted, appreciate recs  Shock, ?septic Resolved Afebrile, with fluctuating leukocytosis Patient required IV pressors, currently off pressors 1 of 2 blood cultures positive for staph coagulase negative.  Patient was on IV antibiotics since 9/08---Case was discussed with ID and orthopedic. Plan to hold IV antibiotics  End-stage renal disease on HD Nephrology on board Patient with immature fistula, vascular surgery on board, plan for second stage basilic vein transition fistula as an outpatient Continue dialysis through hemodialysis tunneled catheter  Right lower extremity wound, likely calciphylaxis Still with pain Dr Sharol Given wants to continue with conservative management Continue sodium thiosulfate during dialysis Palliative care consulted  Anemia of chronic kidney disease Stable Daily CBC  Obesity Lifestyle modification advised  Peripheral vascular disease, status post left AKA.  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   Weight as of this encounter: 117 kg.   DVT prophylaxis: Heparin [place] Code Status: Full code Family Communication: None at bedside Disposition Plan: Likely SNF Consultants:   Dr. Sharol Given  Nephrology  PCCM  Procedures:  None  Antimicrobials:  None for now  Subjective: Still complains of lower extremity pain, some diarrhea  Objective: Vitals:   04/19/19 1400 04/19/19 1411 04/19/19 1449 04/19/19 1600  BP: (!) 146/89 (!) 143/85 131/81   Pulse: 90 91 90 90  Resp:  16  16  Temp:  98.1 F (36.7 C)  98.2 F (36.8 C)  TempSrc:  Oral  Oral  SpO2:  97% 95% 99%  Weight:  117 kg    Height:        Intake/Output Summary (Last 24 hours) at 04/19/2019 1643 Last data filed at 04/19/2019 1411 Gross per 24 hour  Intake 660 ml  Output 2016 ml  Net -1356 ml   Filed Weights   04/17/19 1710 04/19/19 1000 04/19/19 1411  Weight: 113 kg 121 kg 117 kg    Examination:  General: NAD   Cardiovascular: S1, S2 present  Respiratory: CTAB  Abdomen: Soft, nontender, nondistended, bowel sounds present  Musculoskeletal: Status post left AKA, right lower extremity with dressing C/D/I  Skin:  Lower extremity necrosis  Psychiatry: Normal mood   Data Reviewed: I have personally reviewed following labs and imaging studies  CBC: Recent Labs  Lab 04/14/19 0354 04/15/19 0301 04/17/19 0843 04/18/19 0347 04/19/19 1021  WBC 16.5* 13.0* 14.8* 12.7* 13.4*  HGB 8.0* 8.3* 9.6* 8.9* 8.3*  HCT 27.0* 27.5* 31.4* 29.3* 27.5*  MCV 93.8 92.0  93.7 93.3 94.2  PLT 319 309 346 342 [id number]   Basic Metabolic Panel: Recent Labs  Lab 04/13/19 0520 04/13/19 1936 04/14/19 0354 04/16/19 1300 04/19/19 1021  NA 135 134* 136 134* 132*  K 3.3* 3.3* 3.3* 2.9* 3.9  CL 98 97* 98 95* 93*  CO2 21* 20* 20* 20* 21*  GLUCOSE 73 74 109* 114* 124*  BUN 18 23* 26* 29* 36*  CREATININE 4.44* 5.52* 6.09* 6.63* 6.23*  CALCIUM 8.1* 8.3* 8.6* 8.5* 8.6*  PHOS 2.9 3.8 4.8* 4.3 4.4   GFR: Estimated Creatinine Clearance: 18.8 mL/min (A) (by C-G formula based on SCr of 6.23 mg/dL (H)). Liver Function Tests: Recent Labs  Lab 04/13/19 0520 04/13/19 1936 04/14/19 0354 04/16/19 1300 04/19/19 1021  ALBUMIN 1.6* 1.6* 1.6* 1.7* 1.7*   No results for input(s): LIPASE, AMYLASE in the last 168 hours. No results for input(s): AMMONIA in the last 168 hours. Coagulation Profile: Recent Labs  Lab 04/13/19 0520  INR 1.6*   Cardiac Enzymes: No results for input(s): CKTOTAL, CKMB, CKMBINDEX, TROPONINI in the last 168 hours. BNP (last 3 results) No results for input(s): PROBNP in the last 8760 hours. HbA1C: No results for input(s): HGBA1C in the last 72 hours. CBG: Recent Labs  Lab 04/15/19 2009 04/16/19 0512 04/16/19 0728 04/16/19 1138 04/18/19 0736  GLUCAP 140* 142* 113* 107* 107*   Lipid Profile: No results for input(s): CHOL, HDL, LDLCALC,  TRIG, CHOLHDL, LDLDIRECT in the last 72 hours. Thyroid Function Tests: No results for input(s): TSH, T4TOTAL, FREET4, T3FREE, THYROIDAB in the last 72 hours. Anemia Panel: No results for input(s): VITAMINB12, FOLATE, FERRITIN, TIBC, IRON, RETICCTPCT in the last 72 hours. Sepsis Labs: Recent Labs  Lab 04/13/19 0520 04/14/19 0354  PROCALCITON 25.13 19.37    Recent Results (from the past 240 hour(s))  MRSA PCR Screening     Status: None   Collection Time: 04/10/19  1:48 AM   Specimen: Nasal Mucosa; Nasopharyngeal  Result Value Ref Range Status   MRSA by PCR NEGATIVE NEGATIVE Final    Comment:         The GeneXpert MRSA Assay (FDA approved for NASAL specimens only), is one component of a comprehensive MRSA colonization surveillance program. It is not intended to diagnose MRSA infection nor to guide or monitor treatment for MRSA infections. Performed at [facility], [place] [address] [place], [place] [address]   Culture, blood (Routine X 2) w Reflex to ID Panel     Status: Abnormal   Collection Time: 04/10/19  2:07 AM   Specimen: BLOOD RIGHT HAND  Result Value Ref Range Status   Specimen Description BLOOD RIGHT HAND  Final   Special Requests   Final    BOTTLES DRAWN AEROBIC AND ANAEROBIC Blood Culture adequate volume   Culture  Setup Time   Final    GRAM POSITIVE COCCI AEROBIC BOTTLE ONLY CRITICAL RESULT CALLED TO, READ BACK BY AND VERIFIED WITH: PHRMD J ORIET @ [id number] 04/11/19 BY S GEZAHEGN    Culture (A)  Final    STAPHYLOCOCCUS SPECIES (COAGULASE NEGATIVE) THE SIGNIFICANCE OF ISOLATING THIS ORGANISM FROM A SINGLE SET OF BLOOD CULTURES WHEN MULTIPLE SETS ARE DRAWN IS UNCERTAIN. PLEASE NOTIFY THE MICROBIOLOGY DEPARTMENT WITHIN ONE WEEK IF SPECIATION AND SENSITIVITIES ARE REQUIRED. Performed at [facility], [place] [address] [place], [place] [address]    Report Status 04/12/2019 FINAL  Final  Blood Culture ID Panel (Reflexed)     Status: Abnormal   Collection Time: 04/10/19  2:07 AM  Result Value Ref Range Status   Enterococcus species NOT DETECTED NOT DETECTED Final   Listeria monocytogenes NOT DETECTED NOT DETECTED Final   Staphylococcus species DETECTED (A) NOT DETECTED Final    Comment: Methicillin (oxacillin) resistant coagulase negative staphylococcus. Possible blood culture contaminant (unless isolated from more than one blood culture draw or clinical case suggests pathogenicity). No antibiotic treatment is indicated for blood  culture contaminants. CRITICAL RESULT CALLED TO, READ BACK BY AND VERIFIED WITH: PHRMD J ORIET @ 0612 04/11/19 BY S GEZAHEGN     Staphylococcus aureus (BCID) NOT DETECTED NOT DETECTED Final   Methicillin resistance DETECTED (A) NOT DETECTED Final    Comment: CRITICAL RESULT CALLED TO, READ BACK BY AND VERIFIED WITH: PHRMD J ORIET @ [id number] 04/11/19 BY S GEZAHEGN    Streptococcus species NOT DETECTED NOT DETECTED Final   Streptococcus agalactiae NOT DETECTED NOT DETECTED Final   Streptococcus pneumoniae NOT DETECTED NOT DETECTED Final   Streptococcus pyogenes NOT DETECTED NOT DETECTED Final   Acinetobacter baumannii NOT DETECTED NOT DETECTED Final   Enterobacteriaceae species NOT DETECTED NOT DETECTED Final   Enterobacter cloacae complex NOT DETECTED NOT DETECTED Final   Escherichia coli NOT DETECTED NOT DETECTED Final   Klebsiella oxytoca NOT DETECTED NOT DETECTED Final   Klebsiella pneumoniae NOT DETECTED NOT DETECTED Final   Proteus species NOT DETECTED NOT DETECTED Final   Serratia marcescens NOT DETECTED  NOT DETECTED Final   Haemophilus influenzae NOT DETECTED NOT DETECTED Final   Neisseria meningitidis NOT DETECTED NOT DETECTED Final   Pseudomonas aeruginosa NOT DETECTED NOT DETECTED Final   Candida albicans NOT DETECTED NOT DETECTED Final   Candida glabrata NOT DETECTED NOT DETECTED Final   Candida krusei NOT DETECTED NOT DETECTED Final   Candida parapsilosis NOT DETECTED NOT DETECTED Final   Candida tropicalis NOT DETECTED NOT DETECTED Final    Comment: Performed at [facility], [place] [address], [place], [place] [address]  Culture, blood (Routine X 2) w Reflex to ID Panel     Status: None   Collection Time: 04/10/19  4:00 AM   Specimen: BLOOD  Result Value Ref Range Status   Specimen Description BLOOD CENTRAL LINE  Final   Special Requests   Final    BOTTLES DRAWN AEROBIC AND ANAEROBIC Blood Culture adequate volume   Culture   Final    NO GROWTH 5 DAYS Performed at [facility], [place] [address], [place], [place] [address]    Report Status 04/15/2019 FINAL  Final  Culture, respiratory  (non-expectorated)     Status: None   Collection Time: 04/11/19 10:20 AM   Specimen: Tracheal Aspirate; Respiratory  Result Value Ref Range Status   Specimen Description TRACHEAL ASPIRATE  Final   Special Requests NONE  Final   Gram Stain   Final    RARE WBC PRESENT,BOTH PMN AND MONONUCLEAR NO ORGANISMS SEEN    Culture   Final    RARE Consistent with normal respiratory flora. Performed at [facility], [place] [address], [place], [place] [address]    Report Status 04/14/2019 FINAL  Final         Radiology Studies: No results found.      Scheduled Meds: . atorvastatin  40 mg Oral Q2000  . Chlorhexidine Gluconate Cloth  6 each Topical Q0600  . cholestyramine  8 g Oral BID  . darbepoetin (ARANESP) injection - DIALYSIS  150 mcg Intravenous Q Wed-HD  . DULoxetine  30 mg Oral Daily  . feeding supplement (NEPRO CARB STEADY)  237 mL Oral BID BM  . feeding supplement (PRO-STAT SUGAR FREE 64)  30 mL Oral BID  . ferric citrate  420 mg Oral TID WC  . heparin injection (subcutaneous)  5,000 Units Subcutaneous Q8H  . mouth rinse  15 mL Mouth Rinse BID  . midodrine  10 mg Oral Q M,W,F  . multivitamin  1 tablet Oral QHS  . pantoprazole  40 mg Oral Daily  . saccharomyces boulardii  250 mg Oral BID   Continuous Infusions: . sodium thiosulfate infusion for calciphylaxis Stopped (04/19/19 1436)     LOS: 22 days    Time spent: 35 minutes.     Alma Friendly, MD [facility]   If 7PM-7AM, please contact night-coverage [email or web address] 04/19/2019, 4:43 PM

## 2019-04-19 NOTE — Progress Notes (Signed)
Patient receiving meds at this time. RN stated she will place patient on CPAP when he is ready. RT informed RN to call if assistance is needed. RT will monitor as needed.

## 2019-04-19 NOTE — Progress Notes (Signed)
OT Cancellation Note  Patient Details Name: Michael Riggs MRN: [id number] DOB: 01/23/1971   Cancelled Treatment:    Reason Eval/Treat Not Completed: Patient at procedure or test/ unavailable Pt off floor for HD. OT will return as time allows and pt is appropriate.   Dorinda Hill OTR/L Acute Rehabilitation Services Office: [place] 04/19/2019, 10:48 AM

## 2019-04-19 NOTE — Progress Notes (Signed)
PT Cancellation Note  Patient Details Name: Michael Riggs MRN: [id number] DOB: 02/15/1971   Cancelled Treatment:    Reason Eval/Treat Not Completed: (P) Patient at procedure or test/unavailable Pt off floor for HD. PT will follow back this afternoon for treatment as able.  Aaminah Forrester B. Migdalia Dk PT, DPT Acute Rehabilitation Services Pager [phone] Office [phone]    [place] 04/19/2019, 10:28 AM

## 2019-04-19 NOTE — Progress Notes (Signed)
[name], male   DOB: Sep 09, 1970, 48 y.o.   MRN: [id number] Following from sidelines. Will need second stage basilic vein transition fistula once he is stabilized from his multiple medical issues.  Can be coordinated as an outpatient or prior to dialysis when more stable.

## 2019-04-20 LAB — CBC WITH DIFFERENTIAL/PLATELET
Abs Immature Granulocytes: 0.41 10*3/uL — ABNORMAL HIGH (ref 0.00–0.07)
Basophils Absolute: 0.1 10*3/uL (ref 0.0–0.1)
Basophils Relative: 1 %
Eosinophils Absolute: 0.3 10*3/uL (ref 0.0–0.5)
Eosinophils Relative: 3 %
HCT: 28.7 % — ABNORMAL LOW (ref 39.0–52.0)
Hemoglobin: 8.8 g/dL — ABNORMAL LOW (ref 13.0–17.0)
Immature Granulocytes: 4 %
Lymphocytes Relative: 19 %
Lymphs Abs: 1.9 10*3/uL (ref 0.7–4.0)
MCH: 28.6 pg (ref 26.0–34.0)
MCHC: 30.7 g/dL (ref 30.0–36.0)
MCV: 93.2 fL (ref 80.0–100.0)
Monocytes Absolute: 0.9 10*3/uL (ref 0.1–1.0)
Monocytes Relative: 9 %
Neutro Abs: 6.5 10*3/uL (ref 1.7–7.7)
Neutrophils Relative %: 64 %
Platelets: 342 10*3/uL (ref 150–400)
RBC: 3.08 MIL/uL — ABNORMAL LOW (ref 4.22–5.81)
RDW: 19.5 % — ABNORMAL HIGH (ref 11.5–15.5)
WBC: 10.2 10*3/uL (ref 4.0–10.5)
nRBC: 0 % (ref 0.0–0.2)

## 2019-04-20 LAB — BASIC METABOLIC PANEL
Anion gap: 16 — ABNORMAL HIGH (ref 5–15)
BUN: 22 mg/dL — ABNORMAL HIGH (ref 6–20)
CO2: 24 mmol/L (ref 22–32)
Calcium: 8.6 mg/dL — ABNORMAL LOW (ref 8.9–10.3)
Chloride: 94 mmol/L — ABNORMAL LOW (ref 98–111)
Creatinine, Ser: 4.28 mg/dL — ABNORMAL HIGH (ref 0.61–1.24)
GFR calc Af Amer: 18 mL/min — ABNORMAL LOW (ref >60)
GFR calc non Af Amer: 15 mL/min — ABNORMAL LOW (ref >60)
Glucose, Bld: 130 mg/dL — ABNORMAL HIGH (ref 70–99)
Potassium: 4.2 mmol/L (ref 3.5–5.1)
Sodium: 134 mmol/L — ABNORMAL LOW (ref 135–145)

## 2019-04-20 MED ORDER — COLLAGENASE 250 UNIT/GM EX OINT
TOPICAL_OINTMENT | Freq: Every day | CUTANEOUS | Status: DC
  Administered 2019-04-20: 18:00:00 via TOPICAL
  Filled 2019-04-20: qty 30

## 2019-04-20 MED ORDER — CHOLESTYRAMINE 4 G PO PACK
8.0000 g | PACK | Freq: Two times a day (BID) | ORAL | Status: DC
  Administered 2019-04-20 – 2019-04-26 (×12): 8 g via ORAL
  Filled 2019-04-20 (×17): qty 2

## 2019-04-20 NOTE — Progress Notes (Signed)
    General surgery called to see a sacral wound. He has mild skin breakdown of gluteals around rectal tube. No surgical treatment necessary. Defer wound care to [place] team.  Wellington Hampshire, [facility] Surgery 04/20/2019, 2:46 PM Pager: [phone] Mon 7:00 am -11:30 AM Tues-Fri 7:00 am-4:30 pm Sat-Sun 7:00 am-11:30 am

## 2019-04-20 NOTE — Progress Notes (Signed)
PROGRESS NOTE    Michael Riggs  VXB:[id number] DOB: 27-Nov-1970 DOA: 03/28/2019 PCP: [name], MD   Brief Narrative: 48 year old with past medical history significant for end-stage renal disease on hemodialysis TTS, hypertension, hyperlipidemia who was sent from a skilled nursing facility for evaluation of fever, confusion and right lower extremity pain concerning for infection.  Over the last 2 months he had been admitted to the hospital for worsening renal function and fluid overload.  Patient was a started on dialysis and eventually sent to rehab.  This time he was admitted for sepsis secondary to right lower extremity cellulitis with concern for necrotic tissue.  Orthopedic and plastic surgery was consulted they recommend medical management and have Dr. Sharol Given follow. Hospital course complicated by profound hypotension and shock with loss of consciousness and require intubation.  Patient received CPR, required intubation.  Transfer to the ICU.  He was a started on IV pressors subsequently weaned off IV pressors.  He was extubated on 04/11/2019. Patient continue to have a lot of pain with intermittent lethargy and confusion likely due to pain medication. Palliative care, for goals of care.  Patient remain full code, full scope of care. He wouldn't want to be trach or G tube. 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 Pain better manage with oral pain medications.    Assessment & Plan:   Principal Problem:   Wound infection Active Problems:   Hypertension   ESRD (end stage renal disease) on dialysis ([place])   Dyslipidemia   Diabetes mellitus with peripheral vascular disease ([place])   Depression   Physical deconditioning   Calciphylaxis   Palliative care encounter   Goals of care, counseling/discussion    Acute Hypoxic Respiratory Failure Improving Patient lost consciousness on 9/21, became severely hypotensive.  CODE BLUE was called and he was intubated and received  CPR. Patient was extubated on 9/22 CTA chest negative for acute PE or thoracic aortic dissection, small bilateral pleural effusions right greater than left with dependent atelectasis/consolidation in both lung bases PCCM consulted, recommending palliative care consult for goals of care, high risk deterioration. difficult situation with patient in pain, requiring pain medication and resp. status.  CPAP at HS ordered, continue supplemental oxygen PRN  Acute metabolic/toxic encephalopathy Improving Multifactorial, likely due to above in addition to pain medication Monitor closely  Diarrhea Afebrile, with resolving leukocytosis C. difficile panel negative, GI panel pending Continue imodium PRN, cholestyramine GI consulted, appreciate recs  Shock, ?septic Resolved Afebrile, with resolving leukocytosis Patient required IV pressors, currently off pressors 1 of 2 blood cultures positive for staph coagulase negative.  Patient was on IV antibiotics since 9/08---Case was discussed with ID and orthopedic. Plan to d/c IV antibiotics  End-stage renal disease on HD Nephrology on board Patient with immature fistula, vascular surgery on board, plan for second stage basilic vein transition fistula as an outpatient Continue dialysis through hemodialysis tunneled catheter  Right lower extremity wound, likely calciphylaxis Dr Sharol Given wants to continue with conservative management Continue sodium thiosulfate during dialysis Palliative care consulted  Anemia of chronic kidney disease Stable Daily CBC  Obesity Lifestyle modification advised  Peripheral vascular disease, status post left AKA.  Pressure Injury 02/27/19 Penis WOC evaluation day of PIP data, this wound is trauma related to [facility] insertions and removals and documented abnormal male penile tissue per urology (Active)  02/27/19 1000  Location: Penis  Location Orientation:   Staging:   Wound Description (Comments): WOC evaluation day of  PIP data, this  wound is trauma related to [facility] insertions and removals and documented abnormal male penile tissue per urology  Present on Admission: No     Nutrition Problem: Increased nutrient needs Etiology: wound healing    Signs/Symptoms: estimated needs    Interventions: Nepro shake, MVI, Prostat  Estimated body mass index is 37.01 kg/m as calculated from the following:   Height as of this encounter: 5' 10" (1.778 m).   Weight as of this encounter: 117 kg.   DVT prophylaxis: Heparin Ferdinand Code Status: Full code Family Communication: None at bedside Disposition Plan: Likely SNF Consultants:   Dr. Sharol Given  Nephrology  PCCM  Procedures:  None  Antimicrobials:  None for now  Subjective: Met pt speaking on the phone with multiple family members, denies any new complaints   Objective: Vitals:   04/20/19 0605 04/20/19 0806 04/20/19 1107 04/20/19 1300  BP: (!) 143/98 (!) 143/88 (!) 150/94 (!) 151/90  Pulse: 84 100 92   Resp: _0 Temp: 97.9 F (36.6 C) 97.7 F (36.5 C) 98.2 F (36.8 C) 98.6 F (37 C)  TempSrc:  Axillary Oral Oral  SpO2: 99% 100% 99%   Weight:      Height:        Intake/Output Summary (Last 24 hours) at 04/20/2019 1426 Last data filed at 04/20/2019 0807 Gross per 24 hour  Intake 642 ml  Output -  Net 642 ml   Filed Weights   04/17/19 1710 04/19/19 1000 04/19/19 1411  Weight: 113 kg 121 kg 117 kg    Examination:  General: NAD   Cardiovascular: S1, S2 present  Respiratory: CTAB  Abdomen: Soft, nontender, nondistended, bowel sounds present  Musculoskeletal: S/P left AKA, right lower extremity with dressing C/D/I. No bilateral pedal edema noted  Skin: Lower extremity wound, perirectal skin breakdown  Psychiatry: Normal mood  Data Reviewed: I have personally reviewed following labs and imaging studies  CBC: Recent Labs  Lab 04/15/19 0301 04/17/19 0843 04/18/19 0347 04/19/19 1021 04/20/19 0847  WBC 13.0* 14.8* 12.7*  13.4* 10.2  NEUTROABS  --   --   --   --  6.5  HGB 8.3* 9.6* 8.9* 8.3* 8.8*  HCT 27.5* 31.4* 29.3* 27.5* 28.7*  MCV 92.0 93.7 93.3 94.2 93.2  PLT 309 346 342 344 315   Basic Metabolic Panel: Recent Labs  Lab 04/13/19 1936 04/14/19 0354 04/16/19 1300 04/19/19 1021 04/20/19 0847  NA 134* 136 134* 132* 134*  K 3.3* 3.3* 2.9* 3.9 4.2  CL 97* 98 95* 93* 94*  CO2 20* 20* 20* 21* 24  GLUCOSE 74 109* 114* 124* 130*  BUN 23* 26* 29* 36* 22*  CREATININE 5.52* 6.09* 6.63* 6.23* 4.28*  CALCIUM 8.3* 8.6* 8.5* 8.6* 8.6*  PHOS 3.8 4.8* 4.3 4.4  --    GFR: Estimated Creatinine Clearance: 27.3 mL/min (A) (by C-G formula based on SCr of 4.28 mg/dL (H)). Liver Function Tests: Recent Labs  Lab 04/13/19 1936 04/14/19 0354 04/16/19 1300 04/19/19 1021  ALBUMIN 1.6* 1.6* 1.7* 1.7*   No results for input(s): LIPASE, AMYLASE in the last 168 hours. No results for input(s): AMMONIA in the last 168 hours. Coagulation Profile: No results for input(s): INR, PROTIME in the last 168 hours. Cardiac Enzymes: No results for input(s): CKTOTAL, CKMB, CKMBINDEX, TROPONINI in the last 168 hours. BNP (last 3 results) No results for input(s): PROBNP in the last 8760 hours. HbA1C: No results for input(s): HGBA1C in the last 72 hours. CBG: Recent Labs  Lab 04/15/19 2009 04/16/19 0512 04/16/19 0728 04/16/19 1138 04/18/19 0736  GLUCAP 140* 142* 113* 107* 107*   Lipid Profile: No results for input(s): CHOL, HDL, LDLCALC, TRIG, CHOLHDL, LDLDIRECT in the last 72 hours. Thyroid Function Tests: No results for input(s): TSH, T4TOTAL, FREET4, T3FREE, THYROIDAB in the last 72 hours. Anemia Panel: No results for input(s): VITAMINB12, FOLATE, FERRITIN, TIBC, IRON, RETICCTPCT in the last 72 hours. Sepsis Labs: Recent Labs  Lab 04/14/19 0354  PROCALCITON 19.37    Recent Results (from the past 240 hour(s))  Culture, respiratory (non-expectorated)     Status: None   Collection Time: 04/11/19 10:20 AM    Specimen: Tracheal Aspirate; Respiratory  Result Value Ref Range Status   Specimen Description TRACHEAL ASPIRATE  Final   Special Requests NONE  Final   Gram Stain   Final    RARE WBC PRESENT,BOTH PMN AND MONONUCLEAR NO ORGANISMS SEEN    Culture   Final    RARE Consistent with normal respiratory flora. Performed at [facility], [address] [place], [place] [address]    Report Status 04/14/2019 FINAL  Final         Radiology Studies: No results found.      Scheduled Meds: . atorvastatin  40 mg Oral Q2000  . Chlorhexidine Gluconate Cloth  6 each Topical Q0600  . cholestyramine  8 g Oral BID  . collagenase   Topical Daily  . darbepoetin (ARANESP) injection - DIALYSIS  150 mcg Intravenous Q Wed-HD  . DULoxetine  30 mg Oral Daily  . feeding supplement (NEPRO CARB STEADY)  237 mL Oral BID BM  . feeding supplement (PRO-STAT SUGAR FREE 64)  30 mL Oral BID  . ferric citrate  420 mg Oral TID WC  . heparin injection (subcutaneous)  5,000 Units Subcutaneous Q8H  . mouth rinse  15 mL Mouth Rinse BID  . midodrine  10 mg Oral Q M,W,F  . multivitamin  1 tablet Oral QHS  . pantoprazole  40 mg Oral Daily  . saccharomyces boulardii  250 mg Oral BID   Continuous Infusions: . sodium thiosulfate infusion for calciphylaxis Stopped (04/19/19 1436)     LOS: 23 days    Time spent: 35 minutes.      J , MD [facility]   If 7PM-7AM, please contact night-coverage [email or web address] 04/20/2019, 2:26 PM  

## 2019-04-20 NOTE — Progress Notes (Addendum)
[facility] Progress Note   Subjective:    Seen in room. Felt like he was getting overheated in dialysis yesterday. Pain controlled this am, No CP, SOB.   Objective Vitals:   04/19/19 2237 04/20/19 0206 04/20/19 0605 04/20/19 0806  BP: (!) 145/92 (!) 149/95 (!) 143/98 (!) 143/88  Pulse: 85 80 84 100  Resp: 14 (!) 9 16 20   Temp: (!) 97.5 F (36.4 C)  97.9 F (36.6 C) 97.7 F (36.5 C)  TempSrc: Axillary   Axillary  SpO2: 100% 99% 99% 100%  Weight:      Height:       Physical Exam General: Well developed, alert male in NAD Heart: RRR, no murmurs, rubs or gallops Lungs: CTA b/l without wheezing, rhonchi or rales Abdomen: Soft, non-tender, non-distended. Extremities:  Bilateral leg wounds, Extensive wounds w eschar.  See images in media folder (updated 9/30) Dialysis Access: R IJ TDC, L AVF maturing +bruit   Additional Objective Labs: Basic Metabolic Panel: Recent Labs  Lab 04/14/19 0354 04/16/19 1300 04/19/19 1021 04/20/19 0847  NA 136 134* 132* 134*  K 3.3* 2.9* 3.9 4.2  CL 98 95* 93* 94*  CO2 20* 20* 21* 24  GLUCOSE 109* 114* 124* 130*  BUN 26* 29* 36* 22*  CREATININE 6.09* 6.63* 6.23* 4.28*  CALCIUM 8.6* 8.5* 8.6* 8.6*  PHOS 4.8* 4.3 4.4  --    Liver Function Tests: Recent Labs  Lab 04/14/19 0354 04/16/19 1300 04/19/19 1021  ALBUMIN 1.6* 1.7* 1.7*   CBC: Recent Labs  Lab 04/15/19 0301 04/17/19 0843 04/18/19 0347 04/19/19 1021 04/20/19 0847  WBC 13.0* 14.8* 12.7* 13.4* 10.2  NEUTROABS  --   --   --   --  6.5  HGB 8.3* 9.6* 8.9* 8.3* 8.8*  HCT 27.5* 31.4* 29.3* 27.5* 28.7*  MCV 92.0 93.7 93.3 94.2 93.2  PLT 309 346 342 344 342   Blood Culture    Component Value Date/Time   SDES TRACHEAL ASPIRATE 04/11/2019 1020   SPECREQUEST NONE 04/11/2019 1020   CULT  04/11/2019 1020    RARE Consistent with normal respiratory flora. Performed at [facility], [place] [address] [place], [place] [address]    REPTSTATUS 04/14/2019 FINAL  04/11/2019 1020    CBG: Recent Labs  Lab 04/15/19 2009 04/16/19 0512 04/16/19 0728 04/16/19 1138 04/18/19 0736  GLUCAP 140* 142* 113* 107* 107*    Studies/Results: No results found. Medications: . sodium thiosulfate infusion for calciphylaxis Stopped (04/19/19 1436)   . atorvastatin  40 mg Oral Q2000  . Chlorhexidine Gluconate Cloth  6 each Topical Q0600  . cholestyramine  8 g Oral BID  . darbepoetin (ARANESP) injection - DIALYSIS  150 mcg Intravenous Q Wed-HD  . DULoxetine  30 mg Oral Daily  . feeding supplement (NEPRO CARB STEADY)  237 mL Oral BID BM  . feeding supplement (PRO-STAT SUGAR FREE 64)  30 mL Oral BID  . ferric citrate  420 mg Oral TID WC  . heparin injection (subcutaneous)  5,000 Units Subcutaneous Q8H  . mouth rinse  15 mL Mouth Rinse BID  . midodrine  10 mg Oral Q M,W,F  . multivitamin  1 tablet Oral QHS  . pantoprazole  40 mg Oral Daily  . saccharomyces boulardii  250 mg Oral BID    Dialysis Orders: [facility] MWF 4 hrs 180NRe 400/800 120 kg 2.0 K/ 2.25 Ca -Heparin 4000 units IV TIW -Calcitriol 0.25 mcg PO TIW  Assessment/Plan: 1. Shock/Cardiac arrest:  Required  intubation/pressor support. Clinically improved. Off pressors. Received antibiotics 9/8-9/23. Per primary.  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 Lesions do not appear to be worsening. Continue low calcium bath, avoid VDRA. Continue sodium thiosulfate, wound care.   ESRD: MWF HD. Next HD 10/2. Was receiving serial HD on admission d/t calciphylaxis.  Will add 4th HD on Sat as outpatient.. Plan to continue 4 days/week dialysis as outpatient to improve healing of calciphylaxis wounds.  4. Acute respiratory failure with hypoxia.  Extubated 9/22. Remains on supp oxygen. CPAP at night.   5. HTN/volume:   BP stable on midodrine. No evidence of volume overload on exam. Continue volume management with HD.  6. Anemia: Hemoglobin 8.8 . Continue aranesp  150 mcg q Wednesday. 7. Secondary hyperparathyroidism:  Avoid calcium/vit D in setting of calciphylaxis Continue auryxia.  8. Dialysis Access: Patient was scheduled for elective second stage basilic vein transposition on 9/21 which was postponed because of cardiac arrest.  9. GOC - Seen by palliative care 9/26 - Remains full code, full scope of care   Ogechi Larina Earthly PA-C [place] Pager [phone] 04/20/2019,9:34 AM  Pt seen, examined and agree w A/P as above.  Kelly Splinter  MD 04/20/2019, 1:16 PM

## 2019-04-20 NOTE — Progress Notes (Signed)
[facility] Gastroenterology Progress Note  Michael Riggs 48 y.o. 1971/02/26   Subjective: Feels a little better. Feels like diarrhea is less.  Objective: Vital signs: Vitals:   04/20/19 0605 04/20/19 0806  BP: (!) 143/98 (!) 143/88  Pulse: 84 100  Resp: 16 20  Temp: 97.9 F (36.6 C) 97.7 F (36.5 C)  SpO2: 99% 100%    Physical Exam: Gen: alert, no acute distress, obese HEENT: anicteric sclera CV: RRR Chest: CTA B Abd: periumbilical and epigastric tenderness with guarding, nondistended, +BS, obese  Lab Results: Recent Labs    04/19/19 1021 04/20/19 0847  NA 132* 134*  K 3.9 4.2  CL 93* 94*  CO2 21* 24  GLUCOSE 124* 130*  BUN 36* 22*  CREATININE 6.23* 4.28*  CALCIUM 8.6* 8.6*  PHOS 4.4  --    Recent Labs    04/19/19 1021  ALBUMIN 1.7*   Recent Labs    04/19/19 1021 04/20/19 0847  WBC 13.4* 10.2  NEUTROABS  --  6.5  HGB 8.3* 8.8*  HCT 27.5* 28.7*  MCV 94.2 93.2  PLT 344 342      Assessment/Plan: Diarrhea - slowly improving in volume. Rectal tube bag changed yesterday and volume not yet reported in flowsheet (asked nurse to record volume). Tolerating higher dose of Cholestyramine. GI pathogen panel pending. Do not think a flex sig is needed and patient again states that he does not want to have that done. Continue supportive care. Will sign off. Call if questions. F/U with GI prn.   Michael Riggs 04/20/2019, 9:50 AM  Questions please call [phone] ID: Michael Riggs, male   DOB: [birth year], 48 y.o.   MRN: [id number]

## 2019-04-20 NOTE — Plan of Care (Signed)
  Problem: Education: Goal: Knowledge of General Education information will improve Description Including pain rating scale, medication(s)/side effects and non-pharmacologic comfort measures Outcome: Progressing   

## 2019-04-20 NOTE — Progress Notes (Signed)
PT Cancellation Note  Patient Details Name: Michael Riggs MRN: [id number] DOB: 10-02-1970   Cancelled Treatment:    Reason Eval/Treat Not Completed: Patient declined, no reason specified, states he just ordered his lunch and wants to sleep. Despite encouragement, patient declines getting up to recliner today.   Leshea Jaggers 04/20/2019, 12:57 PM

## 2019-04-20 NOTE — Consult Note (Addendum)
[facility] Nurse wound consult note Reason for Consult: Consult requested for buttock wounds.  [place] team is working remotely today.  Reviewed photos and progress notes in the EMR.  WOC consult was completed on 9/9 for leg wounds.  Dr Sharol Given of the ortho team performed surgical debridement on 9/18 and is following for assessment and plan of care for BLE..  Pt has extensive necrotic locations related to calciphalaxis, and now his buttocks are involved, according to consult note.  They are necrotic and have large amt foul-smelling drainage.  This complex medical condition is beyond the scope of practice for [place]; please refer to the surgical team for possible debridement of nonviable tissue. Secure chat message sent to the primary team. Pressure Injury POA: These are full thickness wounds, NOT pressure injuries. Dressing procedure/placement/frequency: Air mattress to reduce pressure. Santyl ointment to provide enzymatic debridement until further recommendations are available from the surgical team Please re-consult if further assistance is needed.  Thank-you,  Julien Girt MSN, [place], [place], [place], [place]

## 2019-04-21 LAB — CBC
HCT: 30.8 % — ABNORMAL LOW (ref 39.0–52.0)
Hemoglobin: 9.1 g/dL — ABNORMAL LOW (ref 13.0–17.0)
MCH: 27.6 pg (ref 26.0–34.0)
MCHC: 29.5 g/dL — ABNORMAL LOW (ref 30.0–36.0)
MCV: 93.3 fL (ref 80.0–100.0)
Platelets: 394 10*3/uL (ref 150–400)
RBC: 3.3 MIL/uL — ABNORMAL LOW (ref 4.22–5.81)
RDW: 19.2 % — ABNORMAL HIGH (ref 11.5–15.5)
WBC: 11.4 10*3/uL — ABNORMAL HIGH (ref 4.0–10.5)
nRBC: 0 % (ref 0.0–0.2)

## 2019-04-21 LAB — BASIC METABOLIC PANEL
Anion gap: 15 (ref 5–15)
BUN: 30 mg/dL — ABNORMAL HIGH (ref 6–20)
CO2: 22 mmol/L (ref 22–32)
Calcium: 9 mg/dL (ref 8.9–10.3)
Chloride: 93 mmol/L — ABNORMAL LOW (ref 98–111)
Creatinine, Ser: 5.46 mg/dL — ABNORMAL HIGH (ref 0.61–1.24)
GFR calc Af Amer: 13 mL/min — ABNORMAL LOW (ref >60)
GFR calc non Af Amer: 11 mL/min — ABNORMAL LOW (ref >60)
Glucose, Bld: 119 mg/dL — ABNORMAL HIGH (ref 70–99)
Potassium: 4.3 mmol/L (ref 3.5–5.1)
Sodium: 130 mmol/L — ABNORMAL LOW (ref 135–145)

## 2019-04-21 MED ORDER — CHLORHEXIDINE GLUCONATE CLOTH 2 % EX PADS
6.0000 | MEDICATED_PAD | Freq: Every day | CUTANEOUS | Status: DC
  Administered 2019-04-22 – 2019-04-27 (×3): 6 via TOPICAL

## 2019-04-21 MED ORDER — HEPARIN SODIUM (PORCINE) 1000 UNIT/ML IJ SOLN
INTRAMUSCULAR | Status: AC
  Administered 2019-04-21: 3800 [IU]
  Filled 2019-04-21: qty 4

## 2019-04-21 MED ORDER — OXYCODONE HCL 5 MG PO TABS
ORAL_TABLET | ORAL | Status: AC
  Filled 2019-04-21: qty 1

## 2019-04-21 NOTE — Progress Notes (Signed)
PT Cancellation Note  Patient Details Name: Michael Riggs MRN: [id number] DOB: 06-Sep-1970   Cancelled Treatment:    Reason Eval/Treat Not Completed: Patient at procedure or test/unavailable. Will re-attempt later if time allows. If not, will return next week.    [name] 04/21/2019, 12:56 PM

## 2019-04-21 NOTE — Progress Notes (Signed)
Rectal tube found in bed. When I attempted reinsertion the patient stated that it was very painful and refused to let me insert.

## 2019-04-21 NOTE — Progress Notes (Signed)
[name], male   DOB: 17-Jan-1971, 48 y.o.   MRN: [id number]  Orthopedic Follow up for bilateral leg calciphylaxis.   Right lower leg ulcerations are stable and seem somewhat less painful with exam today. No signs of cellulitis/infection.  Would continue local care/conservative care and sodium Thiosulfate in HD at this point.   Shawnta Zimbelman,PA-C [place] [facility] [phone]

## 2019-04-21 NOTE — Progress Notes (Signed)
PROGRESS NOTE    Michael Riggs  [id number] DOB: Jun 28, 1971 DOA: 03/28/2019 PCP: [name], MD   Brief Narrative: 48 year old with past medical history significant for end-stage renal disease on hemodialysis TTS, hypertension, hyperlipidemia who was sent from a skilled nursing facility for evaluation of fever, confusion and right lower extremity pain concerning for infection.  Over the last 2 months he had been admitted to the hospital for worsening renal function and fluid overload.  Patient was a started on dialysis and eventually sent to rehab.  This time he was admitted for sepsis secondary to right lower extremity cellulitis with concern for necrotic tissue.  Orthopedic and plastic surgery was consulted they recommend medical management and have Dr. Sharol Given follow. Hospital course complicated by profound hypotension and shock with loss of consciousness and require intubation.  Patient received CPR, required intubation.  Transfer to the ICU.  He was a started on IV pressors subsequently weaned off IV pressors.  He was extubated on 04/11/2019. Patient continue to have a lot of pain with intermittent lethargy and confusion likely due to pain medication. Palliative care, for goals of care.  Patient remain full code, full scope of care. He wouldn't want to be trach or G tube. 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 Pain better manage with oral pain medications.    Assessment & Plan:   Principal Problem:   Wound infection Active Problems:   Hypertension   ESRD (end stage renal disease) on dialysis ([place])   Dyslipidemia   Diabetes mellitus with peripheral vascular disease ([place])   Depression   Physical deconditioning   Calciphylaxis   Palliative care encounter   Goals of care, counseling/discussion    Acute Hypoxic Respiratory Failure Resolved Patient lost consciousness on 9/21, became severely hypotensive.  CODE BLUE was called and he was intubated and received  CPR. Patient was extubated on 9/22 CTA chest negative for acute PE or thoracic aortic dissection, small bilateral pleural effusions right greater than left with dependent atelectasis/consolidation in both lung bases PCCM consulted, recommending palliative care consult for goals of care, high risk deterioration. difficult situation with patient in pain, requiring pain medication and resp. status.  CPAP at HS ordered, continue supplemental oxygen PRN  Acute metabolic/toxic encephalopathy Resolved Multifactorial, likely due to above in addition to pain medication Monitor closely  Diarrhea Improved Afebrile, with resolved leukocytosis C. difficile panel negative, GI panel unremarkable Continue imodium PRN, cholestyramine GI consulted, appreciate recs  Shock, ?septic Resolved Patient required IV pressors, currently off pressors 1 of 2 blood cultures positive for staph coagulase negative.  Patient was on IV antibiotics since 9/08---Case was discussed with ID and orthopedic. IV antibiotics discontinued on 04/12/2019  End-stage renal disease on HD Nephrology on board Patient with immature fistula, vascular surgery on board, plan for second stage basilic vein transition fistula as an outpatient Continue dialysis through hemodialysis tunneled catheter  Right lower extremity wound, likely calciphylaxis Dr Sharol Given wants to continue with conservative management Continue sodium thiosulfate during dialysis Palliative care consulted, wants to be full code  Anemia of chronic kidney disease Stable Daily CBC  Obesity Lifestyle modification advised  Peripheral vascular disease, status post left AKA.  Pressure Injury 02/27/19 Penis WOC evaluation day of PIP data, this wound is trauma related to [facility] insertions and removals and documented abnormal male penile tissue per urology (Active)  02/27/19 1000  Location: Penis  Location Orientation:   Staging:   Wound Description (Comments): WOC  evaluation day of PIP  data, this wound is trauma related to [facility] insertions and removals and documented abnormal male penile tissue per urology  Present on Admission: No     Nutrition Problem: Increased nutrient needs Etiology: wound healing    Signs/Symptoms: estimated needs    Interventions: Nepro shake, MVI, Prostat  Estimated body mass index is 37.33 kg/m (pended) as calculated from the following:   Height as of this encounter: 5\' 10"  (1.778 m).   Weight as of this encounter: (P) 118 kg.   DVT prophylaxis: Heparin [place] Code Status: Full code Family Communication: None at bedside Disposition Plan: Likely SNF Consultants:   Dr. Sharol Given  Nephrology  PCCM  Procedures:  None  Antimicrobials:  None for now  Subjective: Patient seen and examined at bedside, denies any new complaints.  Stated he just wanted to sleep.  Objective: Vitals:   04/21/19 1030 04/21/19 1100 04/21/19 1130 04/21/19 1200  BP: (!) 176/96 (!) 162/92 (!) 167/86 (!) 171/90  Pulse: 89 86 89 94  Resp: 16 16 16 16   Temp:      TempSrc:      SpO2:      Weight:      Height:        Intake/Output Summary (Last 24 hours) at 04/21/2019 1445 Last data filed at 04/21/2019 1408 Gross per 24 hour  Intake 240 ml  Output 0 ml  Net 240 ml   Filed Weights   04/19/19 1411 04/20/19 2309 04/21/19 0945  Weight: 117 kg 118 kg (P) 118 kg    Examination:  General: NAD   Cardiovascular: S1, S2 present  Respiratory: CTAB  Abdomen: Soft, nontender, nondistended, bowel sounds present  Musculoskeletal: S/P L AKA, RLE with dressing C/D/I.  No bilateral pedal edema noted  Skin:  Wound as noted above, with perirectal skin breakdown  Psychiatry: Normal mood   Data Reviewed: I have personally reviewed following labs and imaging studies  CBC: Recent Labs  Lab 04/15/19 0301 04/17/19 0843 04/18/19 0347 04/19/19 1021 04/20/19 0847  WBC 13.0* 14.8* 12.7* 13.4* 10.2  NEUTROABS  --   --   --   --  6.5  HGB  8.3* 9.6* 8.9* 8.3* 8.8*  HCT 27.5* 31.4* 29.3* 27.5* 28.7*  MCV 92.0 93.7 93.3 94.2 93.2  PLT 309 346 342 344 [id number]   Basic Metabolic Panel: Recent Labs  Lab 04/16/19 1300 04/19/19 1021 04/20/19 0847 04/21/19 0751  NA 134* 132* 134* 130*  K 2.9* 3.9 4.2 4.3  CL 95* 93* 94* 93*  CO2 20* 21* 24 22  GLUCOSE 114* 124* 130* 119*  BUN 29* 36* 22* 30*  CREATININE 6.63* 6.23* 4.28* 5.46*  CALCIUM 8.5* 8.6* 8.6* 9.0  PHOS 4.3 4.4  --   --    GFR: Estimated Creatinine Clearance: 21.5 mL/min (A) (by C-G formula based on SCr of 5.46 mg/dL (H)). Liver Function Tests: Recent Labs  Lab 04/16/19 1300 04/19/19 1021  ALBUMIN 1.7* 1.7*   No results for input(s): LIPASE, AMYLASE in the last 168 hours. No results for input(s): AMMONIA in the last 168 hours. Coagulation Profile: No results for input(s): INR, PROTIME in the last 168 hours. Cardiac Enzymes: No results for input(s): CKTOTAL, CKMB, CKMBINDEX, TROPONINI in the last 168 hours. BNP (last 3 results) No results for input(s): PROBNP in the last 8760 hours. HbA1C: No results for input(s): HGBA1C in the last 72 hours. CBG: Recent Labs  Lab 04/15/19 2009 04/16/19 0512 04/16/19 0728 04/16/19 1138 04/18/19 0736  GLUCAP 140* 142* 113*  107* 107*   Lipid Profile: No results for input(s): CHOL, HDL, LDLCALC, TRIG, CHOLHDL, LDLDIRECT in the last 72 hours. Thyroid Function Tests: No results for input(s): TSH, T4TOTAL, FREET4, T3FREE, THYROIDAB in the last 72 hours. Anemia Panel: No results for input(s): VITAMINB12, FOLATE, FERRITIN, TIBC, IRON, RETICCTPCT in the last 72 hours. Sepsis Labs: No results for input(s): PROCALCITON, LATICACIDVEN in the last 168 hours.  No results found for this or any previous visit (from the past 240 hour(s)).       Radiology Studies: No results found.      Scheduled Meds: . atorvastatin  40 mg Oral Q2000  . Chlorhexidine Gluconate Cloth  6 each Topical Q0600  . Chlorhexidine Gluconate  Cloth  6 each Topical Q0600  . cholestyramine  8 g Oral BID  . darbepoetin (ARANESP) injection - DIALYSIS  150 mcg Intravenous Q Wed-HD  . DULoxetine  30 mg Oral Daily  . feeding supplement (NEPRO CARB STEADY)  237 mL Oral BID BM  . feeding supplement (PRO-STAT SUGAR FREE 64)  30 mL Oral BID  . ferric citrate  420 mg Oral TID WC  . heparin injection (subcutaneous)  5,000 Units Subcutaneous Q8H  . mouth rinse  15 mL Mouth Rinse BID  . midodrine  10 mg Oral Q M,W,F  . multivitamin  1 tablet Oral QHS  . oxyCODONE      . pantoprazole  40 mg Oral Daily  . saccharomyces boulardii  250 mg Oral BID   Continuous Infusions: . sodium thiosulfate infusion for calciphylaxis Stopped (04/19/19 1436)     LOS: 24 days      Alma Friendly, MD [facility]   If 7PM-7AM, please contact night-coverage [email or web address] 04/21/2019, 2:45 PM

## 2019-04-21 NOTE — Progress Notes (Signed)
Subjective:  Seen on hd  , pain controlled with meds  Tolerating uf   Objective Vital signs in last 24 hours: Vitals:   04/21/19 1030 04/21/19 1100 04/21/19 1130 04/21/19 1200  BP: (!) 176/96 (!) 162/92 (!) 167/86 (!) 171/90  Pulse: 89 86 89 94  Resp: 16 16 16 16   Temp:      TempSrc:      SpO2:      Weight:      Height:       Weight change: -3 kg  Physical Exam General:  In HD  Alert , Well developed, alert male in NAD Heart: RRR, no murmurs, rubs or gallops Lungs: CTA b/l without wheezing, rhonchi or rales Abdomen: Soft, non-tender, non-distended. Extremities:  Bilateral leg wounds, Extensive wounds w eschar.  See images in media folder (updated 9/30) Dialysis Access: R IJ TDC patent on hd , L AVF maturing +bruit     Dialysis Orders: [place] MWF 4 hrs 180NRe 400/800 120 kg 2.0 K/ 2.25 Ca -Heparin 4000 units IV TIW -Calcitriol 0.25 mcg PO TIW  Problem/Plan: 1.Shock/Cardiac arrest: Required intubation/pressor support. Clinically improved. Off pressors. Received antibiotics 9/8-9/23. Per primary.  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 Lesions do not appear to be worsening. Continue low calcium bath, avoid VDRA. Continue sodium thiosulfate, wound care.  3.ESRD:MWF HD. Next HD 10/2. Was receiving serial HD on admission d/t calciphylaxis.  Will add 4th HD on Sat as outpatient.. Plan to continue 4 days/week dialysis as outpatient to improve healing of calciphylaxis wounds.  4. Acute respiratory failure with hypoxia.  Extubated 9/22. Remains on supp oxygen. CPAP at night.   5.HTN/volume:BP stable on midodrine. No evidence of volume overload on exam. Continue volume management with HD.  6. Anemia:Hemoglobin  9.1  . Continue aranesp 150 mcg q Wednesday. 7. Secondary hyperparathyroidism:Avoid calcium/vit D in setting of calciphylaxis Continue auryxia.  Phos 4.4  Stable  8.Dialysis Access: Patient was scheduled for  elective second stage basilic vein transposition on 9/21 which was postponed because of cardiac arrest.  9. GOC - Seen by palliative care 9/26 - Remains full code, full scope of care    Ernest Haber, PA-C [place] [phone] 04/21/2019,6:07 PM  LOS: 24 days   Labs: Basic Metabolic Panel: Recent Labs  Lab 04/16/19 1300 04/19/19 1021 04/20/19 0847 04/21/19 0751  NA 134* 132* 134* 130*  K 2.9* 3.9 4.2 4.3  CL 95* 93* 94* 93*  CO2 20* 21* 24 22  GLUCOSE 114* 124* 130* 119*  BUN 29* 36* 22* 30*  CREATININE 6.63* 6.23* 4.28* 5.46*  CALCIUM 8.5* 8.6* 8.6* 9.0  PHOS 4.3 4.4  --   --    Liver Function Tests: Recent Labs  Lab 04/16/19 1300 04/19/19 1021  ALBUMIN 1.7* 1.7*   No results for input(s): LIPASE, AMYLASE in the last 168 hours. No results for input(s): AMMONIA in the last 168 hours. CBC: Recent Labs  Lab 04/17/19 0843 04/18/19 0347 04/19/19 1021 04/20/19 0847 04/21/19 1605  WBC 14.8* 12.7* 13.4* 10.2 11.4*  NEUTROABS  --   --   --  6.5  --   HGB 9.6* 8.9* 8.3* 8.8* 9.1*  HCT 31.4* 29.3* 27.5* 28.7* 30.8*  MCV 93.7 93.3 94.2 93.2 93.3  PLT 346 342 344 342 394   Cardiac Enzymes: No results for input(s): CKTOTAL, CKMB, CKMBINDEX, TROPONINI in the last 168 hours. CBG: Recent Labs  Lab 04/15/19 2009 04/16/19 [id number] 04/16/19 0728 04/16/19 1138 04/18/19  0736  GLUCAP 140* 142* 113* 107* 107*    Studies/Results: No results found. Medications: . sodium thiosulfate infusion for calciphylaxis 25 g (04/21/19 1652)   . atorvastatin  40 mg Oral Q2000  . Chlorhexidine Gluconate Cloth  6 each Topical Q0600  . Chlorhexidine Gluconate Cloth  6 each Topical Q0600  . cholestyramine  8 g Oral BID  . darbepoetin (ARANESP) injection - DIALYSIS  150 mcg Intravenous Q Wed-HD  . DULoxetine  30 mg Oral Daily  . feeding supplement (NEPRO CARB STEADY)  237 mL Oral BID BM  . feeding supplement (PRO-STAT SUGAR FREE 64)  30 mL Oral BID  . ferric  citrate  420 mg Oral TID WC  . heparin injection (subcutaneous)  5,000 Units Subcutaneous Q8H  . mouth rinse  15 mL Mouth Rinse BID  . midodrine  10 mg Oral Q M,W,F  . multivitamin  1 tablet Oral QHS  . oxyCODONE      . pantoprazole  40 mg Oral Daily  . saccharomyces boulardii  250 mg Oral BID

## 2019-04-21 NOTE — Consult Note (Addendum)
WOC follow-up: Refer to[previous consult note on 10/1. Pt was assessed by surgical team for buttocks wounds with mod amt tan foul drainage.  Refer to photo in the EMR.  There is no role for debridement, according to surgical team.  There are minimal patchy areas of eschar and pink moist full thickness wounds located across bilat buttocks; appearance is consistent with moisture associated skin damage. Pt is frequently incontinent of stool and it would not be possible to keep a dressing from becoming soiled. Flexiseal had previously been inserted and fell out, according to nursing notes.  Plan: Barrier cream to repel moisture and attempt to protected affected areas. Please re-consult if further assistance is needed.  Thank-you,  Julien Girt MSN, [place], [place], [place], [place]

## 2019-04-22 LAB — BASIC METABOLIC PANEL
Anion gap: 17 — ABNORMAL HIGH (ref 5–15)
BUN: 18 mg/dL (ref 6–20)
CO2: 21 mmol/L — ABNORMAL LOW (ref 22–32)
Calcium: 8.4 mg/dL — ABNORMAL LOW (ref 8.9–10.3)
Chloride: 96 mmol/L — ABNORMAL LOW (ref 98–111)
Creatinine, Ser: 4.15 mg/dL — ABNORMAL HIGH (ref 0.61–1.24)
GFR calc Af Amer: 19 mL/min — ABNORMAL LOW (ref >60)
GFR calc non Af Amer: 16 mL/min — ABNORMAL LOW (ref >60)
Glucose, Bld: 106 mg/dL — ABNORMAL HIGH (ref 70–99)
Potassium: 3.7 mmol/L (ref 3.5–5.1)
Sodium: 134 mmol/L — ABNORMAL LOW (ref 135–145)

## 2019-04-22 MED ORDER — DULOXETINE HCL 20 MG PO CPEP
40.0000 mg | ORAL_CAPSULE | Freq: Every day | ORAL | Status: DC
  Administered 2019-04-23 – 2019-04-27 (×5): 40 mg via ORAL
  Filled 2019-04-22 (×6): qty 2

## 2019-04-22 NOTE — Progress Notes (Signed)
Subjective:  Is hesitant to do HD today , dw him need to do in recliner  But can do bed today   Objective Vital signs in last 24 hours: Vitals:   04/21/19 1959 04/22/19 0700 04/22/19 0830 04/22/19 1151  BP: (!) 159/93  (!) 156/97 (!) 166/91  Pulse: 96 95 94 (!) 103  Resp: 15   19  Temp: 98.9 F (37.2 C)  98.3 F (36.8 C) 98.4 F (36.9 C)  TempSrc: Oral  Oral Oral  SpO2: 96%   95%  Weight:      Height:       Weight change: 0 kg  Physical Exam General:  Alert , Well developed, alert male in NAD Heart: RRR, no murmurs, rubs or gallops Lungs: CTA b/l without wheezing, rhonchi or rales Abdomen: Soft, non-tender, non-distended. Extremities: Bilateral leg wounds, Extensive wounds w eschar. See images in media folder (updated 9/30) Dialysis Access: R IJ TDC, L AVF maturing +bruit    Dialysis Orders: [place] MWF 4 hrs 180NRe 400/800 120 kg 2.0 K/ 2.25 Ca -Heparin 4000 units IV TIW -Calcitriol 0.25 mcg PO TIW  Problem/Plan: 1.Shock/Cardiac arrest:Required intubation/pressor support. Clinically improved. Off pressors. Received antibiotics 9/8-9/23. Per primary.  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 Lesions do not appear to be worsening. Continue low calcium bath, avoid VDRA. Continue sodium thiosulfate, wound care.  3.ESRD:normal MWF HD.But MWF Sat  2/2 Calciphylaxis/ k 3.7 ,Was receiving serial HD on admission d/t calciphylaxis. Will add 4th HD on Sat as outpatient.. Plan to continue 4 days/week dialysis as outpatient to improve healing of calciphylaxis wounds.  Needs Recliner  HD  Before dc  4. Acute respiratory failure with hypoxia. Extubated 9/22. Remains on supp oxygen. CPAP at night.  5.HTN/volume:BP stable on midodrine. No evidence of volume overload on exam 2 l UF yesterday .wt 117kg (BEDWT) Continue volume management with HD.  6. Anemia:Hemoglobin  9.1 . Continue aranesp 150 mcg q Wednesday. 7.  Secondary hyperparathyroidism:Avoid calcium/vit D in setting of calciphylaxis Continue auryxia.  Phos 4.4  Stable  8.Dialysis Access: Patient was scheduled for elective second stage basilic vein transposition on 9/21 which was postponed because of cardiac arrest.  9. GOC - Seen by palliative care 9/26 - Remains full code, full scope of care / P acre sign off today    Ernest Haber, PA-C [place] [phone] 04/22/2019,1:00 PM  LOS: 25 days   Labs: Basic Metabolic Panel: Recent Labs  Lab 04/16/19 1300 04/19/19 1021 04/20/19 0847 04/21/19 0751 04/22/19 0349  NA 134* 132* 134* 130* 134*  K 2.9* 3.9 4.2 4.3 3.7  CL 95* 93* 94* 93* 96*  CO2 20* 21* 24 22 21*  GLUCOSE 114* 124* 130* 119* 106*  BUN 29* 36* 22* 30* 18  CREATININE 6.63* 6.23* 4.28* 5.46* 4.15*  CALCIUM 8.5* 8.6* 8.6* 9.0 8.4*  PHOS 4.3 4.4  --   --   --    Liver Function Tests: Recent Labs  Lab 04/16/19 1300 04/19/19 1021  ALBUMIN 1.7* 1.7*   No results for input(s): LIPASE, AMYLASE in the last 168 hours. No results for input(s): AMMONIA in the last 168 hours. CBC: Recent Labs  Lab 04/17/19 0843 04/18/19 0347 04/19/19 1021 04/20/19 0847 04/21/19 1605  WBC 14.8* 12.7* 13.4* 10.2 11.4*  NEUTROABS  --   --   --  6.5  --   HGB 9.6* 8.9* 8.3* 8.8* 9.1*  HCT 31.4* 29.3* 27.5* 28.7* 30.8*  MCV 93.7 93.3  94.2 93.2 93.3  PLT 346 342 344 342 394   Cardiac Enzymes: No results for input(s): CKTOTAL, CKMB, CKMBINDEX, TROPONINI in the last 168 hours. CBG: Recent Labs  Lab 04/15/19 2009 04/16/19 0512 04/16/19 0728 04/16/19 1138 04/18/19 0736  GLUCAP 140* 142* 113* 107* 107*    Studies/Results: No results found. Medications: . sodium thiosulfate infusion for calciphylaxis 25 g (04/21/19 1652)   . atorvastatin  40 mg Oral Q2000  . Chlorhexidine Gluconate Cloth  6 each Topical Q0600  . Chlorhexidine Gluconate Cloth  6 each Topical Q0600  . cholestyramine  8 g Oral BID  .  darbepoetin (ARANESP) injection - DIALYSIS  150 mcg Intravenous Q Wed-HD  . [START ON 04/23/2019] DULoxetine  40 mg Oral Daily  . feeding supplement (NEPRO CARB STEADY)  237 mL Oral BID BM  . feeding supplement (PRO-STAT SUGAR FREE 64)  30 mL Oral BID  . ferric citrate  420 mg Oral TID WC  . heparin injection (subcutaneous)  5,000 Units Subcutaneous Q8H  . mouth rinse  15 mL Mouth Rinse BID  . midodrine  10 mg Oral Q M,W,F  . multivitamin  1 tablet Oral QHS  . pantoprazole  40 mg Oral Daily  . saccharomyces boulardii  250 mg Oral BID

## 2019-04-22 NOTE — Progress Notes (Signed)
PROGRESS NOTE    Michael Riggs  [id number] DOB: 1971/03/23 DOA: 03/28/2019 PCP: [name], MD   Brief Narrative: 48 year old with past medical history significant for end-stage renal disease on hemodialysis TTS, hypertension, hyperlipidemia who was sent from a skilled nursing facility for evaluation of fever, confusion and right lower extremity pain concerning for infection.  Over the last 2 months he had been admitted to the hospital for worsening renal function and fluid overload.  Patient was a started on dialysis and eventually sent to rehab.  This time he was admitted for sepsis secondary to right lower extremity cellulitis with concern for necrotic tissue.  Orthopedic and plastic surgery was consulted they recommend medical management and have Dr. Sharol Given follow. Hospital course complicated by profound hypotension and shock with loss of consciousness and require intubation.  Patient received CPR, required intubation.  Transfer to the ICU.  He was a started on IV pressors subsequently weaned off IV pressors.  He was extubated on 04/11/2019. Patient continue to have a lot of pain with intermittent lethargy and confusion likely due to pain medication. Palliative care, for goals of care.  Patient remain full code, full scope of care. He wouldn't want to be trach or G tube. 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 Pain better manage with oral pain medications.    Assessment & Plan:   Principal Problem:   Wound infection Active Problems:   Hypertension   ESRD (end stage renal disease) on dialysis ([place])   Dyslipidemia   Diabetes mellitus with peripheral vascular disease ([place])   Depression   Physical deconditioning   Calciphylaxis   Palliative care encounter   Goals of care, counseling/discussion    Acute Hypoxic Respiratory Failure Resolved Patient lost consciousness on 9/21, became severely hypotensive.  CODE BLUE was called and he was intubated and received  CPR. Patient was extubated on 9/22 CTA chest negative for acute PE or thoracic aortic dissection, small bilateral pleural effusions right greater than left with dependent atelectasis/consolidation in both lung bases CPAP at HS ordered, continue supplemental oxygen PRN  Acute metabolic/toxic encephalopathy Resolved Multifactorial, likely due to above in addition to pain medication Monitor closely  Diarrhea Improved Afebrile, with resolved leukocytosis C. difficile panel negative, GI panel unremarkable Continue imodium PRN, cholestyramine GI consulted, appreciate recs  Shock, ?septic Resolved Patient required IV pressors, currently off pressors 1 of 2 blood cultures positive for staph coagulase negative.  Patient was on IV antibiotics since 9/08---Case was discussed with ID and orthopedic. IV antibiotics discontinued on 04/12/2019  End-stage renal disease on HD Nephrology on board Patient with immature fistula, vascular surgery on board, plan for second stage basilic vein transition fistula as an outpatient Continue dialysis through hemodialysis tunneled catheter  Right lower extremity wound, likely calciphylaxis Dr Sharol Given wants to continue with conservative management Continue sodium thiosulfate during dialysis Palliative care consulted, wants to be full code  Anemia of chronic kidney disease Stable Daily CBC  Obesity Lifestyle modification advised  Peripheral vascular disease, status post left AKA.  Pressure Injury 02/27/19 Penis WOC evaluation day of PIP data, this wound is trauma related to [facility] insertions and removals and documented abnormal male penile tissue per urology (Active)  02/27/19 1000  Location: Penis  Location Orientation:   Staging:   Wound Description (Comments): WOC evaluation day of PIP data, this wound is trauma related to [facility] insertions and removals and documented abnormal male penile tissue per urology  Present on Admission: No  Nutrition  Problem: Increased nutrient needs Etiology: wound healing    Signs/Symptoms: estimated needs    Interventions: Nepro shake, MVI, Prostat  Estimated body mass index is 37.33 kg/m as calculated from the following:   Height as of this encounter: '5\' 10"'  (1.778 m).   Weight as of this encounter: 118 kg.   DVT prophylaxis: Heparin [place] Code Status: Full code Family Communication: None at bedside Disposition Plan: Likely SNF Consultants:   Dr. Sharol Given  Nephrology  PCCM  Procedures:  None  Antimicrobials:  None for now  Subjective: Patient seen and examined at bedside, met patient sleeping, easily arousable.  Discussed with patient about the need to sit up in the chair/recliner for a couple of hours a day, so asked to be able to be transitioned for outpatient dialysis.  Patient verbalized understanding.  Objective: Vitals:   04/21/19 1959 04/22/19 0700 04/22/19 0830 04/22/19 1151  BP: (!) 159/93  (!) 156/97 (!) 166/91  Pulse: 96 95 94 (!) 103  Resp: 15   19  Temp: 98.9 F (37.2 C)  98.3 F (36.8 C) 98.4 F (36.9 C)  TempSrc: Oral  Oral Oral  SpO2: 96%   95%  Weight:      Height:        Intake/Output Summary (Last 24 hours) at 04/22/2019 1259 Last data filed at 04/21/2019 1408 Gross per 24 hour  Intake 0 ml  Output 0 ml  Net 0 ml   Filed Weights   04/19/19 1411 04/20/19 2309 04/21/19 0945  Weight: 117 kg 118 kg 118 kg    Examination:  General: NAD   Cardiovascular: S1, S2 present  Respiratory: CTAB  Abdomen: Soft, nontender, nondistended, bowel sounds present  Musculoskeletal: S/P L AKA, RLE with dressing C/D/I, no bilateral pedal edema noted  Skin:  Bilateral extensive leg wounds, perirectal skin breakdown  Psychiatry: Normal mood   Data Reviewed: I have personally reviewed following labs and imaging studies  CBC: Recent Labs  Lab 04/17/19 0843 04/18/19 0347 04/19/19 1021 04/20/19 0847 04/21/19 1605  WBC 14.8* 12.7* 13.4* 10.2 11.4*   NEUTROABS  --   --   --  6.5  --   HGB 9.6* 8.9* 8.3* 8.8* 9.1*  HCT 31.4* 29.3* 27.5* 28.7* 30.8*  MCV 93.7 93.3 94.2 93.2 93.3  PLT 346 342 344 342 564   Basic Metabolic Panel: Recent Labs  Lab 04/16/19 1300 04/19/19 1021 04/20/19 0847 04/21/19 0751 04/22/19 0349  NA 134* 132* 134* 130* 134*  K 2.9* 3.9 4.2 4.3 3.7  CL 95* 93* 94* 93* 96*  CO2 20* 21* 24 22 21*  GLUCOSE 114* 124* 130* 119* 106*  BUN 29* 36* 22* 30* 18  CREATININE 6.63* 6.23* 4.28* 5.46* 4.15*  CALCIUM 8.5* 8.6* 8.6* 9.0 8.4*  PHOS 4.3 4.4  --   --   --    GFR: Estimated Creatinine Clearance: 28.3 mL/min (A) (by C-G formula based on SCr of 4.15 mg/dL (H)). Liver Function Tests: Recent Labs  Lab 04/16/19 1300 04/19/19 1021  ALBUMIN 1.7* 1.7*   No results for input(s): LIPASE, AMYLASE in the last 168 hours. No results for input(s): AMMONIA in the last 168 hours. Coagulation Profile: No results for input(s): INR, PROTIME in the last 168 hours. Cardiac Enzymes: No results for input(s): CKTOTAL, CKMB, CKMBINDEX, TROPONINI in the last 168 hours. BNP (last 3 results) No results for input(s): PROBNP in the last 8760 hours. HbA1C: No results for input(s): HGBA1C in the last 72 hours. CBG:  Recent Labs  Lab 04/15/19 2009 04/16/19 0512 04/16/19 0728 04/16/19 1138 04/18/19 0736  GLUCAP 140* 142* 113* 107* 107*   Lipid Profile: No results for input(s): CHOL, HDL, LDLCALC, TRIG, CHOLHDL, LDLDIRECT in the last 72 hours. Thyroid Function Tests: No results for input(s): TSH, T4TOTAL, FREET4, T3FREE, THYROIDAB in the last 72 hours. Anemia Panel: No results for input(s): VITAMINB12, FOLATE, FERRITIN, TIBC, IRON, RETICCTPCT in the last 72 hours. Sepsis Labs: No results for input(s): PROCALCITON, LATICACIDVEN in the last 168 hours.  No results found for this or any previous visit (from the past 240 hour(s)).       Radiology Studies: No results found.      Scheduled Meds: . atorvastatin  40 mg  Oral Q2000  . Chlorhexidine Gluconate Cloth  6 each Topical Q0600  . Chlorhexidine Gluconate Cloth  6 each Topical Q0600  . cholestyramine  8 g Oral BID  . darbepoetin (ARANESP) injection - DIALYSIS  150 mcg Intravenous Q Wed-HD  . [START ON 04/23/2019] DULoxetine  40 mg Oral Daily  . feeding supplement (NEPRO CARB STEADY)  237 mL Oral BID BM  . feeding supplement (PRO-STAT SUGAR FREE 64)  30 mL Oral BID  . ferric citrate  420 mg Oral TID WC  . heparin injection (subcutaneous)  5,000 Units Subcutaneous Q8H  . mouth rinse  15 mL Mouth Rinse BID  . midodrine  10 mg Oral Q M,W,F  . multivitamin  1 tablet Oral QHS  . pantoprazole  40 mg Oral Daily  . saccharomyces boulardii  250 mg Oral BID   Continuous Infusions: . sodium thiosulfate infusion for calciphylaxis 25 g (04/21/19 1652)     LOS: 25 days      Alma Friendly, MD [facility]   If 7PM-7AM, please contact night-coverage [email or web address] 04/22/2019, 12:59 PM

## 2019-04-22 NOTE — Plan of Care (Signed)
  Problem: Activity: Goal: Risk for activity intolerance will decrease Outcome: Progressing   Problem: Health Behavior/Discharge Planning: Goal: Ability to manage health-related needs will improve Outcome: Progressing   Problem: Clinical Measurements: Goal: Ability to maintain clinical measurements within normal limits will improve Outcome: Progressing Goal: Will remain free from infection Outcome: Progressing Goal: Diagnostic test results will improve Outcome: Progressing Goal: Respiratory complications will improve Outcome: Progressing Goal: Cardiovascular complication will be avoided Outcome: Progressing

## 2019-04-22 NOTE — Progress Notes (Signed)
Advised floor RN Vickie the plan to do his hemodialysis treatment on 10/4

## 2019-04-22 NOTE — Progress Notes (Addendum)
Palliative:    Chart reviewed.  Visited patient at bedside.  He is eating cheese eggs and bacon.  He states he feels stronger and his legs still hurt badly but they have improved.   He has been asked to do HD in the recliner chair but does not feel he can do it today.  I explained that he has to be able to do that in order to leave the hospital.  He understands and intends to do it as soon as he is able.  No further complaints.    Appreciate Chaplain notarizing HCPOA. Patient is full code, full scope. I increased his Cymbalta dose to 40 mg today.  It can be increased to 60 mg in two weeks if he has no adverse effects.  PMT will sign off.  Please call us back if we can be of assistance with Michael Riggs in the future.  Thank you for involving [place] in his care.  Florentina Jenny, PA-C Palliative Medicine Pager: [phone]  Time 15 min.  Greater than 50%  of this time was spent counseling and coordinating care related to the above assessment and plan

## 2019-04-23 LAB — BASIC METABOLIC PANEL
Anion gap: 20 — ABNORMAL HIGH (ref 5–15)
BUN: 28 mg/dL — ABNORMAL HIGH (ref 6–20)
CO2: 19 mmol/L — ABNORMAL LOW (ref 22–32)
Calcium: 8.9 mg/dL (ref 8.9–10.3)
Chloride: 94 mmol/L — ABNORMAL LOW (ref 98–111)
Creatinine, Ser: 5.63 mg/dL — ABNORMAL HIGH (ref 0.61–1.24)
GFR calc Af Amer: 13 mL/min — ABNORMAL LOW (ref >60)
GFR calc non Af Amer: 11 mL/min — ABNORMAL LOW (ref >60)
Glucose, Bld: 83 mg/dL (ref 70–99)
Potassium: 4 mmol/L (ref 3.5–5.1)
Sodium: 133 mmol/L — ABNORMAL LOW (ref 135–145)

## 2019-04-23 MED ORDER — LIDOCAINE HCL (PF) 1 % IJ SOLN: 5.0000 mL | INTRAMUSCULAR | Status: DC | PRN

## 2019-04-23 MED ORDER — HEPARIN SODIUM (PORCINE) 1000 UNIT/ML DIALYSIS: 1000.0000 [IU] | INTRAMUSCULAR | Status: DC | PRN

## 2019-04-23 MED ORDER — HEPARIN SODIUM (PORCINE) 1000 UNIT/ML DIALYSIS: 4000.0000 [IU] | Freq: Once | INTRAMUSCULAR | Status: DC

## 2019-04-23 MED ORDER — LIDOCAINE-PRILOCAINE 2.5-2.5 % EX CREA
1.0000 "application " | TOPICAL_CREAM | CUTANEOUS | Status: DC | PRN
  Filled 2019-04-23: qty 5

## 2019-04-23 MED ORDER — ALTEPLASE 2 MG IJ SOLR: 2.0000 mg | Freq: Once | INTRAMUSCULAR | Status: DC | PRN

## 2019-04-23 MED ORDER — SODIUM CHLORIDE 0.9 % IV SOLN: 100.0000 mL | INTRAVENOUS | Status: DC | PRN

## 2019-04-23 MED ORDER — PENTAFLUOROPROP-TETRAFLUOROETH EX AERO: 1.0000 "application " | INHALATION_SPRAY | CUTANEOUS | Status: DC | PRN

## 2019-04-23 NOTE — Progress Notes (Signed)
Physical Therapy Treatment Patient Details Name: Michael Riggs MRN: [id number] DOB: 15-Aug-1970 Today's Date: 04/23/2019    History of Present Illness Pt adm from SNF with acute hypoxic respiratory failure and metabolic encephalopathy. Patient is a 48 year old male with a complex recent medical history essentially admitted 3 months ago almost continuous stay in the hospital with complications related to renal failure initiation of dialysis.  Intubated from 9/21 to 9/22.  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     PT Comments    Patient progressing very slowly towards PT goals. Reports pain is slightly improved but still reports burning sensation in right quad and buttocks with sitting EOB. Requires max encouragement and coaxing as well as distraction to get pt to come to EOB. Does not require assist with bed mobility this date but does need increased time and cues. Limited mainly by burning pain. Lengthy discussion with pt regarding importance of increasing activity, upright tolerance, strengthening if pt has any chance of getting home. Pt seems to understand. Next session plan to progress mobility (attempted standing/scooting or using lift). Pt made aware of need to make progress each session, agrees with plan. Encouraged there ex. Will follow.     Follow Up Recommendations  SNF     Equipment Recommendations  Other (comment)(TBA next venue)    Recommendations for Other Services       Precautions / Restrictions Precautions Precautions: Fall Precaution Comments: Lft BKA with prosthesis Restrictions Weight Bearing Restrictions: No    Mobility  Bed Mobility Overal bed mobility: Needs Assistance Bed Mobility: Rolling;Sidelying to Sit;Sit to Sidelying Rolling: Min guard Sidelying to sit: Min guard;HOB elevated     Sit to sidelying: Min guard General bed mobility comments: Able to get self to EOB with heavy use of rails and position hips as well as  return to supine. Assist with pulling self up in bed.  Transfers                 General transfer comment: Pt declined  Ambulation/Gait                 Stairs             Wheelchair Mobility    Modified Rankin (Stroke Patients Only)       Balance Overall balance assessment: Needs assistance Sitting-balance support: Feet supported(RLE supported) Sitting balance-Leahy Scale: Poor Sitting balance - Comments: Sat EOB ~8 minutes with Min guard assist; complaining of worsening burning pain in right quad and buttocks. Postural control: Posterior lean                                  Cognition Arousal/Alertness: Awake/alert Behavior During Therapy: WFL for tasks assessed/performed Overall Cognitive Status: Within Functional Limits for tasks assessed                                 General Comments: Pt wants to be able to get home; but needs max explanation that he needs to work with therapy and gert rehab before going hom. "every time i work hard or try something, they send me to rehab and not home."      Exercises General Exercises - Lower Extremity Long Arc Quad: AAROM;Right;5 reps;Seated    General Comments General comments (skin integrity, edema, etc.): VSS throughout.      Pertinent  Vitals/Pain Pain Assessment: Faces Faces Pain Scale: Hurts whole lot Pain Location: buttocks. RLE Pain Descriptors / Indicators: Grimacing;Sore;Guarding;Burning Pain Intervention(s): Repositioned;Monitored during session;Patient requesting pain meds-RN notified;Limited activity within patient's tolerance    Home Living                      Prior Function            PT Goals (current goals can now be found in the care plan section) Progress towards PT goals: Progressing toward goals(slowly)    Frequency    Min 2X/week      PT Plan Current plan remains appropriate    Co-evaluation              AM-PAC PT "6  Clicks" Mobility   Outcome Measure  Help needed turning from your back to your side while in a flat bed without using bedrails?: A Little Help needed moving from lying on your back to sitting on the side of a flat bed without using bedrails?: A Lot Help needed moving to and from a bed to a chair (including a wheelchair)?: Total Help needed standing up from a chair using your arms (e.g., wheelchair or bedside chair)?: Total Help needed to walk in hospital room?: Total Help needed climbing 3-5 steps with a railing? : Total 6 Click Score: 9    End of Session   Activity Tolerance: Patient limited by pain Patient left: in bed;with call bell/phone within reach;with bed alarm set Nurse Communication: Mobility status;Need for lift equipment PT Visit Diagnosis: Other abnormalities of gait and mobility (R26.89);Muscle weakness (generalized) (M62.81);Pain Pain - Right/Left: Right Pain - part of body: Leg(buttocks)     Time: [id number] PT Time Calculation (min) (ACUTE ONLY): 16 min  Charges:  $Therapeutic Activity: 8-22 mins                     Wray Kearns, PT, DPT Acute Rehabilitation Services Pager [phone] Office [phone]       Marguarite Arbour A Sabra Heck 04/23/2019, 12:18 PM

## 2019-04-23 NOTE — Progress Notes (Signed)
PROGRESS NOTE    Michael Riggs  [name] DOB: [birth year] DOA: 03/28/2019 PCP: [name], MD   Brief Narrative: 48 year old with past medical history significant for end-stage renal disease on hemodialysis TTS, hypertension, hyperlipidemia who was sent from a skilled nursing facility for evaluation of fever, confusion and right lower extremity pain concerning for infection.  Over the last 2 months he had been admitted to the hospital for worsening renal function and fluid overload.  Patient was a started on dialysis and eventually sent to rehab.  This time he was admitted for sepsis secondary to right lower extremity cellulitis with concern for necrotic tissue.  Orthopedic and plastic surgery was consulted they recommend medical management and have Dr. Sharol Given follow. Hospital course complicated by profound hypotension and shock with loss of consciousness and require intubation.  Patient received CPR, required intubation.  Transfer to the ICU.  He was a started on IV pressors subsequently weaned off IV pressors.  He was extubated on 04/11/2019. Patient continue to have a lot of pain with intermittent lethargy and confusion likely due to pain medication. Palliative care, for goals of care.  Patient remain full code, full scope of care. He wouldn't want to be trach or G tube. 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 Pain better manage with oral pain medications.    Assessment & Plan:   Principal Problem:   Wound infection Active Problems:   Hypertension   ESRD (end stage renal disease) on dialysis ([place])   Dyslipidemia   Diabetes mellitus with peripheral vascular disease ([place])   Depression   Physical deconditioning   Calciphylaxis   Palliative care encounter   Goals of care, counseling/discussion    Acute Hypoxic Respiratory Failure Resolved Patient lost consciousness on 9/21, became severely hypotensive.  CODE BLUE was called and he was intubated and received  CPR. Patient was extubated on 9/22 CTA chest negative for acute PE or thoracic aortic dissection, small bilateral pleural effusions right greater than left with dependent atelectasis/consolidation in both lung bases CPAP at HS ordered, continue supplemental oxygen PRN  Acute metabolic/toxic encephalopathy Resolved Multifactorial, likely due to above in addition to pain medication Monitor closely  Diarrhea Resolved Afebrile, with resolved leukocytosis C. difficile panel negative, GI panel unremarkable Continue imodium PRN, cholestyramine GI consulted, appreciate recs  Shock, ?septic Resolved Patient required IV pressors, currently off pressors 1 of 2 blood cultures positive for staph coagulase negative.  Patient was on IV antibiotics since 9/08---Case was discussed with ID and orthopedic. IV antibiotics discontinued on 04/12/2019  End-stage renal disease on HD Nephrology on board Patient with immature fistula, vascular surgery on board, plan for second stage basilic vein transition fistula as an outpatient Continue dialysis through hemodialysis tunneled catheter  Right lower extremity wound, likely calciphylaxis Dr Sharol Given wants to continue with conservative management Continue sodium thiosulfate during dialysis Palliative care consulted, wants to be full code  Anemia of chronic kidney disease Stable Daily CBC  Obesity Lifestyle modification advised  Peripheral vascular disease, status post left AKA.  Pressure Injury 02/27/19 Penis WOC evaluation day of PIP data, this wound is trauma related to [facility] insertions and removals and documented abnormal male penile tissue per urology (Active)  02/27/19 1000  Location: Penis  Location Orientation:   Staging:   Wound Description (Comments): WOC evaluation day of PIP data, this wound is trauma related to [facility] insertions and removals and documented abnormal male penile tissue per urology  Present on Admission: No  Nutrition  Problem: Increased nutrient needs Etiology: wound healing    Signs/Symptoms: estimated needs    Interventions: Nepro shake, MVI, Prostat  Estimated body mass index is 36.06 kg/m as calculated from the following:   Height as of this encounter: 5\' 10"  (1.778 m).   Weight as of this encounter: 114 kg.   DVT prophylaxis: Heparin [place] Code Status: Full code Family Communication: None at bedside Disposition Plan: Likely SNF Consultants:   Dr. Sharol Given  Nephrology  PCCM  Procedures:  None  Antimicrobials:  None for now  Subjective: Patient seen and examined at bedside.  Once again advised he needs to get out of bed and sit on a recliner to be able to have outpatient dialysis.  Patient denies any new complaints.  Objective: Vitals:   04/22/19 2057 04/23/19 0407 04/23/19 0924 04/23/19 0925  BP: (!) 163/90 (!) 163/91 (!) 149/97 (!) 149/97  Pulse: 98  93 93  Resp: 16  19 17   Temp: 98.9 F (37.2 C) 98.6 F (37 C) 97.7 F (36.5 C)   TempSrc: Oral Axillary Oral   SpO2: 96%  97% 98%  Weight:  114 kg    Height:       No intake or output data in the 24 hours ending 04/23/19 1510 Filed Weights   04/20/19 2309 04/21/19 0945 04/23/19 0407  Weight: 118 kg 118 kg 114 kg    Examination:  General: NAD   Cardiovascular: S1, S2 present  Respiratory: CTAB  Abdomen: Soft, nontender, nondistended, bowel sounds present  Musculoskeletal: S/P L AKA, RLE with dressing C/D/I, no bilateral pedal edema noted  Skin:  Bilateral extensive leg wounds, perirectal skin breakdown  Psychiatry: Normal mood   Data Reviewed: I have personally reviewed following labs and imaging studies  CBC: Recent Labs  Lab 04/17/19 0843 04/18/19 0347 04/19/19 1021 04/20/19 0847 04/21/19 1605  WBC 14.8* 12.7* 13.4* 10.2 11.4*  NEUTROABS  --   --   --  6.5  --   HGB 9.6* 8.9* 8.3* 8.8* 9.1*  HCT 31.4* 29.3* 27.5* 28.7* 30.8*  MCV 93.7 93.3 94.2 93.2 93.3  PLT 346 342 344 342 [id number]   Basic  Metabolic Panel: Recent Labs  Lab 04/19/19 1021 04/20/19 0847 04/21/19 0751 04/22/19 0349 04/23/19 0757  NA 132* 134* 130* 134* 133*  K 3.9 4.2 4.3 3.7 4.0  CL 93* 94* 93* 96* 94*  CO2 21* 24 22 21* 19*  GLUCOSE 124* 130* 119* 106* 83  BUN 36* 22* 30* 18 28*  CREATININE 6.23* 4.28* 5.46* 4.15* 5.63*  CALCIUM 8.6* 8.6* 9.0 8.4* 8.9  PHOS 4.4  --   --   --   --    GFR: Estimated Creatinine Clearance: 20.5 mL/min (A) (by C-G formula based on SCr of 5.63 mg/dL (H)). Liver Function Tests: Recent Labs  Lab 04/19/19 1021  ALBUMIN 1.7*   No results for input(s): LIPASE, AMYLASE in the last 168 hours. No results for input(s): AMMONIA in the last 168 hours. Coagulation Profile: No results for input(s): INR, PROTIME in the last 168 hours. Cardiac Enzymes: No results for input(s): CKTOTAL, CKMB, CKMBINDEX, TROPONINI in the last 168 hours. BNP (last 3 results) No results for input(s): PROBNP in the last 8760 hours. HbA1C: No results for input(s): HGBA1C in the last 72 hours. CBG: Recent Labs  Lab 04/18/19 0736  GLUCAP 107*   Lipid Profile: No results for input(s): CHOL, HDL, LDLCALC, TRIG, CHOLHDL, LDLDIRECT in the last 72 hours. Thyroid Function Tests: No  results for input(s): TSH, T4TOTAL, FREET4, T3FREE, THYROIDAB in the last 72 hours. Anemia Panel: No results for input(s): VITAMINB12, FOLATE, FERRITIN, TIBC, IRON, RETICCTPCT in the last 72 hours. Sepsis Labs: No results for input(s): PROCALCITON, LATICACIDVEN in the last 168 hours.  No results found for this or any previous visit (from the past 240 hour(s)).       Radiology Studies: No results found.      Scheduled Meds: . atorvastatin  40 mg Oral Q2000  . Chlorhexidine Gluconate Cloth  6 each Topical Q0600  . cholestyramine  8 g Oral BID  . darbepoetin (ARANESP) injection - DIALYSIS  150 mcg Intravenous Q Wed-HD  . DULoxetine  40 mg Oral Daily  . feeding supplement (NEPRO CARB STEADY)  237 mL Oral BID BM   . feeding supplement (PRO-STAT SUGAR FREE 64)  30 mL Oral BID  . ferric citrate  420 mg Oral TID WC  . [START ON 04/24/2019] heparin  4,000 Units Dialysis Once in dialysis  . [START ON 04/24/2019] heparin  4,000 Units Dialysis Once in dialysis  . heparin injection (subcutaneous)  5,000 Units Subcutaneous Q8H  . mouth rinse  15 mL Mouth Rinse BID  . midodrine  10 mg Oral Q M,W,F  . multivitamin  1 tablet Oral QHS  . pantoprazole  40 mg Oral Daily  . saccharomyces boulardii  250 mg Oral BID   Continuous Infusions: . [START ON 04/24/2019] sodium chloride    . [START ON 04/24/2019] sodium chloride    . sodium thiosulfate infusion for calciphylaxis 25 g (04/21/19 1652)     LOS: 26 days      Alma Friendly, MD [facility]   If 7PM-7AM, please contact night-coverage [email or web address] 04/23/2019, 3:10 PM

## 2019-04-23 NOTE — Progress Notes (Addendum)
Subjective:  Refused hd yest , states will do hd today in bed and in reclinwer tomorrow  No new cos , pain controlled with med   Objective Vital signs in last 24 hours: Vitals:   04/22/19 1151 04/22/19 2057 04/23/19 0407 04/23/19 0924  BP: (!) 166/91 (!) 163/90 (!) 163/91 (!) 149/97  Pulse: (!) 103 98  93  Resp: 19 16  19   Temp: 98.4 F (36.9 C) 98.9 F (37.2 C) 98.6 F (37 C) 97.7 F (36.5 C)  TempSrc: Oral Oral Axillary Oral  SpO2: 95% 96%  97%  Weight:   114 kg   Height:       Weight change: -4 kg  Physical Exam General:Alert , alert male in NAD Heart: RRR, no murmurs, rubs or gallops Lungs: CTA b/l without wheezing, rhonchi or rales Abdomen: Soft, non-tender, non-distended. Extremities: Bilateral leg wounds, Extensive wounds w eschar. See images in media folder (updated 9/30) Dialysis Access: R IJ TDC, L AVF maturing +bruit   OP Dialysis Orders: [place] MWF 4 hrs 180NRe 400/800 120 kg 2.0 K/ 2.25 Ca -Heparin 4000 units IV TIW -Calcitriol 0.25 mcg PO TIW  Problem/Plan: 1.Shock/Cardiac arrest:Required intubation/pressor support. Clinically improved. Off pressors. Received antibiotics 9/8-9/23. Per primary.  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 Lesions do not appear to be worsening. Continue low calcium bath, avoid VDRA. Continue sodium thiosulfate, wound care. 3.ESRD:normal MWF HD.But MWF Sat  2/2 Calciphylaxis/ k 3.7 ,Was receiving serial HD on admission d/t calciphylaxis. Have added 4th HD on Sat as outpatient.. Plan to continue 4 days/week dialysis as outpatient to improve healing of calciphylaxis wounds.  Needs Recliner  HD  Before dc  4. Acute respiratory failure with hypoxia. Extubated 9/22. Remains on supp oxygen. CPAP at night.  5.HTN/volume:BP stable on midodrine. No evidence of volume overload on exam 2 l UF 9/30 .wt 117kg (BEDWT) Continue volume management with HD.  6.  Anemia:Hemoglobin9.1. Continue aranesp 150 mcg q Wednesday. 7. Secondary hyperparathyroidism:Avoid calcium/vit D in setting of calciphylaxis Continue auryxia.Phos 4.4 Stable  8.Dialysis Access: Patient was scheduled for elective second stage basilic vein transposition on 9/21 which was postponed because of cardiac arrest.  9. GOC - Seen by palliative care 9/26 - Remains full code, full scope of care/ P care has  signed off    Ernest Haber, PA-C [place] [phone] 04/23/2019,9:41 AM  LOS: 26 days   Pt seen, examined and agree w A/P as above. Pt will need to be mobilized into chair setting for dialysis in order to qualify for OP HD status.  Kelly Splinter  MD 04/23/2019, 4:13 PM       Labs: Basic Metabolic Panel: Recent Labs  Lab 04/16/19 1300 04/19/19 1021  04/21/19 0751 04/22/19 0349 04/23/19 0757  NA 134* 132*   < > 130* 134* 133*  K 2.9* 3.9   < > 4.3 3.7 4.0  CL 95* 93*   < > 93* 96* 94*  CO2 20* 21*   < > 22 21* 19*  GLUCOSE 114* 124*   < > 119* 106* 83  BUN 29* 36*   < > 30* 18 28*  CREATININE 6.63* 6.23*   < > 5.46* 4.15* 5.63*  CALCIUM 8.5* 8.6*   < > 9.0 8.4* 8.9  PHOS 4.3 4.4  --   --   --   --    < > = values in this interval not displayed.   Liver Function Tests: Recent Labs  Lab 04/16/19 1300 04/19/19 1021  ALBUMIN 1.7* 1.7*   No results for input(s): LIPASE, AMYLASE in the last 168 hours. No results for input(s): AMMONIA in the last 168 hours. CBC: Recent Labs  Lab 04/17/19 0843 04/18/19 0347 04/19/19 1021 04/20/19 0847 04/21/19 1605  WBC 14.8* 12.7* 13.4* 10.2 11.4*  NEUTROABS  --   --   --  6.5  --   HGB 9.6* 8.9* 8.3* 8.8* 9.1*  HCT 31.4* 29.3* 27.5* 28.7* 30.8*  MCV 93.7 93.3 94.2 93.2 93.3  PLT 346 342 344 342 394   Cardiac Enzymes: No results for input(s): CKTOTAL, CKMB, CKMBINDEX, TROPONINI in the last 168 hours. CBG: Recent Labs  Lab 04/16/19 1138 04/18/19 0736  GLUCAP 107* 107*     Studies/Results: No results found. Medications: . sodium thiosulfate infusion for calciphylaxis 25 g (04/21/19 1652)   . atorvastatin  40 mg Oral Q2000  . Chlorhexidine Gluconate Cloth  6 each Topical Q0600  . cholestyramine  8 g Oral BID  . darbepoetin (ARANESP) injection - DIALYSIS  150 mcg Intravenous Q Wed-HD  . DULoxetine  40 mg Oral Daily  . feeding supplement (NEPRO CARB STEADY)  237 mL Oral BID BM  . feeding supplement (PRO-STAT SUGAR FREE 64)  30 mL Oral BID  . ferric citrate  420 mg Oral TID WC  . heparin injection (subcutaneous)  5,000 Units Subcutaneous Q8H  . mouth rinse  15 mL Mouth Rinse BID  . midodrine  10 mg Oral Q M,W,F  . multivitamin  1 tablet Oral QHS  . pantoprazole  40 mg Oral Daily  . saccharomyces boulardii  250 mg Oral BID

## 2019-04-24 LAB — GI PATHOGEN PANEL BY PCR, STOOL

## 2019-04-24 LAB — BASIC METABOLIC PANEL
Anion gap: 15 (ref 5–15)
BUN: 18 mg/dL (ref 6–20)
CO2: 23 mmol/L (ref 22–32)
Calcium: 8.8 mg/dL — ABNORMAL LOW (ref 8.9–10.3)
Chloride: 95 mmol/L — ABNORMAL LOW (ref 98–111)
Creatinine, Ser: 4.17 mg/dL — ABNORMAL HIGH (ref 0.61–1.24)
GFR calc Af Amer: 18 mL/min — ABNORMAL LOW (ref >60)
GFR calc non Af Amer: 16 mL/min — ABNORMAL LOW (ref >60)
Glucose, Bld: 90 mg/dL (ref 70–99)
Potassium: 3.3 mmol/L — ABNORMAL LOW (ref 3.5–5.1)
Sodium: 133 mmol/L — ABNORMAL LOW (ref 135–145)

## 2019-04-24 MED ORDER — HEPARIN SODIUM (PORCINE) 1000 UNIT/ML IJ SOLN
INTRAMUSCULAR | Status: AC
  Administered 2019-04-24: 4000 [IU]
  Filled 2019-04-24: qty 1

## 2019-04-24 MED ORDER — HEPARIN SODIUM (PORCINE) 1000 UNIT/ML IJ SOLN
INTRAMUSCULAR | Status: AC
  Filled 2019-04-24: qty 4

## 2019-04-24 MED ORDER — POTASSIUM CHLORIDE CRYS ER 20 MEQ PO TBCR
40.0000 meq | EXTENDED_RELEASE_TABLET | Freq: Once | ORAL | Status: AC
  Administered 2019-04-24: 40 meq via ORAL
  Filled 2019-04-24: qty 2

## 2019-04-24 NOTE — Progress Notes (Signed)
[facility] Progress Note   Subjective: Upset because he had HD so late last PM and has to go again. Emotional support to patient.   Objective Vitals:   04/24/19 0230 04/24/19 0300 04/24/19 0343 04/24/19 0409  BP: (!) 166/99 (!) 157/105 (!) 156/89 (!) 151/97  Pulse: 92 96 95 93  Resp:   13 17  Temp:   97.9 F (36.6 C) 98.1 F (36.7 C)  TempSrc:   Axillary Oral  SpO2:   97% 98%  Weight:   112 kg   Height:       Physical Exam General: Awake, no acute distress Heart: S1,S2 RRR Lungs: CTAB A/P Abdomen: S, NT Extremities: L BKA no stump edema. Multiple wounds with eschar but look better than when last seen 2 weeks ago.  Dialysis Access: RIJ [facility] CDI. L AVF + bruit.    Additional Objective Labs: Basic Metabolic Panel: Recent Labs  Lab 04/19/19 1021  04/22/19 0349 04/23/19 0757 04/24/19 0537  NA 132*   < > 134* 133* 133*  K 3.9   < > 3.7 4.0 3.3*  CL 93*   < > 96* 94* 95*  CO2 21*   < > 21* 19* 23  GLUCOSE 124*   < > 106* 83 90  BUN 36*   < > 18 28* 18  CREATININE 6.23*   < > 4.15* 5.63* 4.17*  CALCIUM 8.6*   < > 8.4* 8.9 8.8*  PHOS 4.4  --   --   --   --    < > = values in this interval not displayed.   Liver Function Tests: Recent Labs  Lab 04/19/19 1021  ALBUMIN 1.7*   No results for input(s): LIPASE, AMYLASE in the last 168 hours. CBC: Recent Labs  Lab 04/18/19 0347 04/19/19 1021 04/20/19 0847 04/21/19 1605  WBC 12.7* 13.4* 10.2 11.4*  NEUTROABS  --   --  6.5  --   HGB 8.9* 8.3* 8.8* 9.1*  HCT 29.3* 27.5* 28.7* 30.8*  MCV 93.3 94.2 93.2 93.3  PLT 342 344 342 394   Blood Culture    Component Value Date/Time   SDES TRACHEAL ASPIRATE 04/11/2019 1020   SPECREQUEST NONE 04/11/2019 1020   CULT  04/11/2019 1020    RARE Consistent with normal respiratory flora. Performed at [facility], [place] [address], [place], [place] [address]    REPTSTATUS 04/14/2019 FINAL 04/11/2019 1020    Cardiac Enzymes: No results for input(s):  CKTOTAL, CKMB, CKMBINDEX, TROPONINI in the last 168 hours. CBG: Recent Labs  Lab 04/18/19 0736  GLUCAP 107*   Iron Studies: No results for input(s): IRON, TIBC, TRANSFERRIN, FERRITIN in the last 72 hours. @lablastinr3 @ Studies/Results: No results found. Medications: . sodium thiosulfate infusion for calciphylaxis 25 g (04/21/19 1652)   . atorvastatin  40 mg Oral Q2000  . Chlorhexidine Gluconate Cloth  6 each Topical Q0600  . cholestyramine  8 g Oral BID  . darbepoetin (ARANESP) injection - DIALYSIS  150 mcg Intravenous Q Wed-HD  . DULoxetine  40 mg Oral Daily  . feeding supplement (NEPRO CARB STEADY)  237 mL Oral BID BM  . feeding supplement (PRO-STAT SUGAR FREE 64)  30 mL Oral BID  . ferric citrate  420 mg Oral TID WC  . heparin      . heparin injection (subcutaneous)  5,000 Units Subcutaneous Q8H  . mouth rinse  15 mL Mouth Rinse BID  . midodrine  10 mg Oral Q M,W,F  .  multivitamin  1 tablet Oral QHS  . pantoprazole  40 mg Oral Daily  . saccharomyces boulardii  250 mg Oral BID     OP Dialysis Orders: [place] MWF 4 hrs 180NRe 400/800 120 kg 2.0 K/ 2.25 Ca -Heparin 4000 units IV TIW -Calcitriol 0.25 mcg PO TIW  Problem/Plan: 1.Shock/Cardiac arrest:Required intubation/pressor support. Clinically improved. Off pressors. Received antibiotics 9/8-9/23. Per primary.  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 Lesions do not appear to be worsening. Continue low calcium bath, avoid VDRA. Continue sodium thiosulfate, wound care. 3.ESRD:normalMWF HD.But MWF Sat 2/2 Calciphylaxis/ k 3.7 ,Was receiving serial HD on admission d/t calciphylaxis. Have added 4th HD on Sat as outpatient.. Plan to continue 4 days/week dialysis as outpatient to improve healing of calciphylaxis wounds.Needs Recliner HD Before dc  4. Acute respiratory failure with hypoxia. Extubated 9/22. Remains on supp oxygen. CPAP at night.   5.HTN/volume:BP stable on midodrine. No evidence of volume overload on exam2 l UF 9/30 .wt 117kg (BEDWT)Continue volume management with HD.  6. Anemia:Hemoglobin9.1 Continue aranesp 150 mcg q Wednesday. 7. Secondary hyperparathyroidism:Avoid calcium/vit D in setting of calciphylaxis Continue auryxia.Phos 4.4 Stable  8.Dialysis Access: Patient was scheduled for elective second stage basilic vein transposition on 9/21 which was postponed because of cardiac arrest.  9. GOC - Seen by palliative care 9/26 - Remains full code, full scope of care.  Elena Cothern H. Giabella Duhart NP-C 04/24/2019, 12:26 PM  [employer] [phone]

## 2019-04-24 NOTE — Care Management Important Message (Signed)
Important Message  Patient Details  Name: Michael Riggs MRN: [id number] Date of Birth: 15-Jan-1971   Medicare Important Message Given:  Yes     [name] 04/24/2019, 2:17 PM

## 2019-04-24 NOTE — Progress Notes (Signed)
Renal Navigator was notified by CSW/B. Cobb that patient refused to sit for HD. Renal Navigator met with patient at bedside to see how he is doing today and discuss need to sit in recliner for HD in order to receive HD treatment in the outpatient setting when ready for discharge. Patient was upset about receiving HD so late last night. Renal Navigator apologized for his experience and asked if we could move forward from this point. Patient seemed agreeable. He states he is tired, but otherwise feeling fairly well. He was eating breakfast and states that he is agreeable to sitting in recliner for HD today, but wants to get up in to a wheelchair after breakfast and before HD. Renal Navigator encouraged this and commends this plan. Renal Navigator explained that we will hold his treatment until later this afternoon, since he just came off early this morning from last nights treatment. Navigator will speak with patient's RN about getting him up to chair prior to HD today. Patient states understanding of need to tolerate HD in recliner as this is the only way to receive outpatient HD treatment. Renal Navigator discussed situation with bedside RN/Rey and updated Nephrologist/Dr. Hollie Salk. Renal Navigator asked HD unit staff to ensure that patient is brought for HD in recliner this afternoon.  Alphonzo Cruise, [place] Renal Navigator [phone]

## 2019-04-24 NOTE — Progress Notes (Signed)
PROGRESS NOTE    Michael Riggs  [id number] DOB: 1971-04-01 DOA: 03/28/2019 PCP: [name], MD   Brief Narrative: 48 year old with past medical history significant for end-stage renal disease on hemodialysis TTS, hypertension, hyperlipidemia who was sent from a skilled nursing facility for evaluation of fever, confusion and right lower extremity pain concerning for infection.  Over the last 2 months he had been admitted to the hospital for worsening renal function and fluid overload.  Patient was a started on dialysis and eventually sent to rehab.  This time he was admitted for sepsis secondary to right lower extremity cellulitis with concern for necrotic tissue.  Orthopedic and plastic surgery was consulted they recommend medical management and have Dr. Sharol Given follow. Hospital course complicated by profound hypotension and shock with loss of consciousness and require intubation.  Patient received CPR, required intubation.  Transfer to the ICU.  He was a started on IV pressors subsequently weaned off IV pressors.  He was extubated on 04/11/2019. Patient continue to have a lot of pain with intermittent lethargy and confusion likely due to pain medication. Palliative care, for goals of care.  Patient remain full code, full scope of care. He wouldn't want to be trach or G tube. 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 Pain better manage with oral pain medications.    Assessment & Plan:   Principal Problem:   Wound infection Active Problems:   Hypertension   ESRD (end stage renal disease) on dialysis ([place])   Dyslipidemia   Diabetes mellitus with peripheral vascular disease ([place])   Depression   Physical deconditioning   Calciphylaxis   Palliative care encounter   Goals of care, counseling/discussion    Acute Hypoxic Respiratory Failure Resolved Patient lost consciousness on 9/21, became severely hypotensive.  CODE BLUE was called and he was intubated and received  CPR. Patient was extubated on 9/22 CTA chest negative for acute PE or thoracic aortic dissection, small bilateral pleural effusions right greater than left with dependent atelectasis/consolidation in both lung bases CPAP at HS ordered, continue supplemental oxygen PRN  Acute metabolic/toxic encephalopathy Resolved Multifactorial, likely due to above in addition to pain medication Monitor closely  Diarrhea Resolved Afebrile, with resolved leukocytosis C. difficile panel negative, GI panel unremarkable Continue imodium PRN, cholestyramine GI consulted, appreciate recs  Shock, ?septic Resolved Patient required IV pressors, currently off pressors 1 of 2 blood cultures positive for staph coagulase negative.  Patient was on IV antibiotics since 9/08---Case was discussed with ID and orthopedic. IV antibiotics discontinued on 04/12/2019  End-stage renal disease on HD Nephrology on board Patient with immature fistula, vascular surgery on board, plan for second stage basilic vein transition fistula as an outpatient Continue dialysis through hemodialysis tunneled catheter Needs to be able to sit in a recliner for dialysis so as to aid transition into outpatient dialysis  Right lower extremity wound, likely calciphylaxis Dr Sharol Given wants to continue with conservative management Continue sodium thiosulfate during dialysis Palliative care consulted, wants to be full code  Anemia of chronic kidney disease Stable Daily CBC  Obesity Lifestyle modification advised  Peripheral vascular disease, status post left AKA.  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   Weight as of this encounter: 112 kg.   DVT prophylaxis: Heparin [place] Code Status: Full code Family Communication: None at bedside Disposition Plan: Likely SNF Consultants:   Dr. Sharol Given  Nephrology  PCCM  Procedures:  None  Antimicrobials:  None   Subjective: Met patient sleeping at bedside, easily arousable.  Denies any new complaints.  Advised for the need to be able to sit on a recliner for HD.  Patient planning to have hemodialysis later on today on a recliner.   Objective: Vitals:   04/24/19 0300 04/24/19 0343 04/24/19 0409 04/24/19 1329  BP: (!) 157/105 (!) 156/89 (!) 151/97   Pulse: 96 95 93   Resp:  13 17   Temp:  97.9 F (36.6 C) 98.1 F (36.7 C) (!) 96.2 F (35.7 C)  TempSrc:  Axillary Oral Oral  SpO2:  97% 98%   Weight:  112 kg    Height:        Intake/Output Summary (Last 24 hours) at 04/24/2019 1333 Last data filed at 04/24/2019 0329 Gross per 24 hour  Intake 120 ml  Output 3000 ml  Net -2880 ml   Filed Weights   04/21/19 0945 04/23/19 0407 04/24/19 0343  Weight: 118 kg 114 kg 112 kg    Examination:  General: NAD   Cardiovascular: S1, S2 present  Respiratory: CTAB  Abdomen: Soft, nontender, nondistended, bowel sounds present  Musculoskeletal: S/p L AKA, RLE with dressing C/D/I, no bilateral pedal edema noted  Skin:  Bilateral extensive leg wounds, perirectal skin breakdown  Psychiatry: Normal mood   Data Reviewed: I have personally reviewed following labs and imaging studies  CBC: Recent Labs  Lab 04/18/19 0347 04/19/19 1021 04/20/19 0847 04/21/19 1605  WBC 12.7*  13.4* 10.2 11.4*  NEUTROABS  --   --  6.5  --   HGB 8.9* 8.3* 8.8* 9.1*  HCT 29.3* 27.5* 28.7* 30.8*  MCV 93.3 94.2 93.2 93.3  PLT 342 344 342 573   Basic Metabolic Panel: Recent Labs  Lab 04/19/19 1021 04/20/19 0847 04/21/19 0751 04/22/19 0349 04/23/19 0757 04/24/19 0537  NA 132* 134* 130* 134* 133* 133*  K 3.9 4.2 4.3 3.7 4.0 3.3*  CL 93* 94* 93* 96* 94* 95*  CO2 21* 24 22 21* 19* 23  GLUCOSE 124* 130* 119* 106* 83 90  BUN 36* 22* 30* 18 28* 18  CREATININE 6.23* 4.28* 5.46* 4.15* 5.63* 4.17*  CALCIUM 8.6* 8.6* 9.0 8.4* 8.9 8.8*  PHOS 4.4  --   --   --   --   --    GFR: Estimated Creatinine Clearance: 27.4 mL/min (A) (by C-G formula based on SCr of 4.17 mg/dL (H)). Liver Function Tests: Recent Labs  Lab 04/19/19 1021  ALBUMIN 1.7*   No results for input(s): LIPASE, AMYLASE in the last 168 hours. No results for input(s): AMMONIA in the last 168 hours. Coagulation Profile: No results for input(s): INR, PROTIME in the last 168 hours. Cardiac Enzymes: No results for input(s): CKTOTAL, CKMB, CKMBINDEX, TROPONINI in the last 168 hours. BNP (last 3 results) No results for input(s): PROBNP in  the last 8760 hours. HbA1C: No results for input(s): HGBA1C in the last 72 hours. CBG: Recent Labs  Lab 04/18/19 0736  GLUCAP 107*   Lipid Profile: No results for input(s): CHOL, HDL, LDLCALC, TRIG, CHOLHDL, LDLDIRECT in the last 72 hours. Thyroid Function Tests: No results for input(s): TSH, T4TOTAL, FREET4, T3FREE, THYROIDAB in the last 72 hours. Anemia Panel: No results for input(s): VITAMINB12, FOLATE, FERRITIN, TIBC, IRON, RETICCTPCT in the last 72 hours. Sepsis Labs: No results for input(s): PROCALCITON, LATICACIDVEN in the last 168 hours.  No results found for this or any previous visit (from the past 240 hour(s)).       Radiology Studies: No results found.      Scheduled Meds: . atorvastatin  40 mg Oral Q2000  . Chlorhexidine Gluconate Cloth  6 each  Topical Q0600  . cholestyramine  8 g Oral BID  . darbepoetin (ARANESP) injection - DIALYSIS  150 mcg Intravenous Q Wed-HD  . DULoxetine  40 mg Oral Daily  . feeding supplement (NEPRO CARB STEADY)  237 mL Oral BID BM  . feeding supplement (PRO-STAT SUGAR FREE 64)  30 mL Oral BID  . ferric citrate  420 mg Oral TID WC  . heparin      . heparin injection (subcutaneous)  5,000 Units Subcutaneous Q8H  . mouth rinse  15 mL Mouth Rinse BID  . midodrine  10 mg Oral Q M,W,F  . multivitamin  1 tablet Oral QHS  . pantoprazole  40 mg Oral Daily  . saccharomyces boulardii  250 mg Oral BID   Continuous Infusions: . sodium thiosulfate infusion for calciphylaxis 25 g (04/21/19 1652)     LOS: 27 days      Alma Friendly, MD [facility]   If 7PM-7AM, please contact night-coverage [email or web address] 04/24/2019, 1:33 PM

## 2019-04-25 LAB — BASIC METABOLIC PANEL
Anion gap: 14 (ref 5–15)
BUN: 14 mg/dL (ref 6–20)
CO2: 24 mmol/L (ref 22–32)
Calcium: 8.5 mg/dL — ABNORMAL LOW (ref 8.9–10.3)
Chloride: 98 mmol/L (ref 98–111)
Creatinine, Ser: 3.33 mg/dL — ABNORMAL HIGH (ref 0.61–1.24)
GFR calc Af Amer: 24 mL/min — ABNORMAL LOW (ref >60)
GFR calc non Af Amer: 21 mL/min — ABNORMAL LOW (ref >60)
Glucose, Bld: 83 mg/dL (ref 70–99)
Potassium: 4.9 mmol/L (ref 3.5–5.1)
Sodium: 136 mmol/L (ref 135–145)

## 2019-04-25 MED ORDER — AMLODIPINE BESYLATE 5 MG PO TABS
5.0000 mg | ORAL_TABLET | Freq: Every day | ORAL | Status: DC
  Administered 2019-04-25 – 2019-04-26 (×2): 5 mg via ORAL
  Filled 2019-04-25 (×2): qty 1

## 2019-04-25 MED ORDER — CHLORHEXIDINE GLUCONATE CLOTH 2 % EX PADS
6.0000 | MEDICATED_PAD | Freq: Every day | CUTANEOUS | Status: DC
  Administered 2019-04-25 – 2019-04-26 (×2): 6 via TOPICAL

## 2019-04-25 NOTE — Progress Notes (Signed)
Renal Navigator spoke with OP HD clinic/[facility] Manager about patient's need for treatment 4x per week. She states she needs notice and confirmation of when patient will return to the clinic as his Sodium Thiosulfate is a medication that they do not keep on hand and is very expensive. She states once they know for sure that he will return to their clinic, she will order it and it will take a couple of days to arrive. Renal Navigator will keep her updated as much as possible and notified Renal NP and Nephrologist of this.  Alphonzo Cruise, [place] Renal Navigator [phone]

## 2019-04-25 NOTE — Progress Notes (Signed)
Renal Navigator realizes that transportation on Saturday to OP HD clinic may be a barrier from SNF and left message for [facility]. Renal Navigator also informed CM and CSW of potential barriers to discharge being transportation to an extra day of OP HD and OP HD clinic needing time to get medication ordered. Renal Navigator to continue to follow closely.   Alphonzo Cruise, [place] Renal Navigator [phone]

## 2019-04-25 NOTE — Progress Notes (Signed)
[facility] Progress Note   Subjective: Hoping to go home soon. No new complaints.     Objective Vitals:   04/24/19 1700 04/24/19 1742 04/24/19 2104 04/25/19 0613  BP: (!) 157/97 (!) 158/95 (!) 166/94 (!) 165/93  Pulse: 92 94  93  Resp:  18  15  Temp:  (!) 96.8 F (36 C) 97.7 F (36.5 C) 97.8 F (36.6 C)  TempSrc:  Oral Oral Oral  SpO2:  96% 100% 97%  Weight:    108 kg  Height:       Physical Exam General: Awake, no acute distress Heart: S1,S2 RRR Lungs: CTAB A/P Abdomen: S, NT Extremities: L BKA no stump edema. Multiple wounds with eschar but look better than when last seen 2 weeks ago.  Dialysis Access: [facility]. L AVF + bruit.    Additional Objective Labs: Basic Metabolic Panel: Recent Labs  Lab 04/19/19 1021  04/23/19 0757 04/24/19 0537 04/25/19 0341  NA 132*   < > 133* 133* 136  K 3.9   < > 4.0 3.3* 4.9  CL 93*   < > 94* 95* 98  CO2 21*   < > 19* 23 24  GLUCOSE 124*   < > 83 90 83  BUN 36*   < > 28* 18 14  CREATININE 6.23*   < > 5.63* 4.17* 3.33*  CALCIUM 8.6*   < > 8.9 8.8* 8.5*  PHOS 4.4  --   --   --   --    < > = values in this interval not displayed.   Liver Function Tests: Recent Labs  Lab 04/19/19 1021  ALBUMIN 1.7*   No results for input(s): LIPASE, AMYLASE in the last 168 hours. CBC: Recent Labs  Lab 04/19/19 1021 04/20/19 0847 04/21/19 1605  WBC 13.4* 10.2 11.4*  NEUTROABS  --  6.5  --   HGB 8.3* 8.8* 9.1*  HCT 27.5* 28.7* 30.8*  MCV 94.2 93.2 93.3  PLT 344 342 394   Blood Culture    Component Value Date/Time   SDES TRACHEAL ASPIRATE 04/11/2019 1020   SPECREQUEST NONE 04/11/2019 1020   CULT  04/11/2019 1020    RARE Consistent with normal respiratory flora. Performed at [facility], [place] [address], [place], [place] [address]    REPTSTATUS 04/14/2019 FINAL 04/11/2019 1020    Cardiac Enzymes: No results for input(s): CKTOTAL, CKMB, CKMBINDEX, TROPONINI in the last 168 hours. CBG: No  results for input(s): GLUCAP in the last 168 hours. Iron Studies: No results for input(s): IRON, TIBC, TRANSFERRIN, FERRITIN in the last 72 hours. @lablastinr3 @ Studies/Results: No results found. Medications: . sodium thiosulfate infusion for calciphylaxis 25 g (04/24/19 1645)   . atorvastatin  40 mg Oral Q2000  . Chlorhexidine Gluconate Cloth  6 each Topical Q0600  . cholestyramine  8 g Oral BID  . darbepoetin (ARANESP) injection - DIALYSIS  150 mcg Intravenous Q Wed-HD  . DULoxetine  40 mg Oral Daily  . feeding supplement (NEPRO CARB STEADY)  237 mL Oral BID BM  . feeding supplement (PRO-STAT SUGAR FREE 64)  30 mL Oral BID  . ferric citrate  420 mg Oral TID WC  . heparin injection (subcutaneous)  5,000 Units Subcutaneous Q8H  . mouth rinse  15 mL Mouth Rinse BID  . midodrine  10 mg Oral Q M,W,F  . multivitamin  1 tablet Oral QHS  . pantoprazole  40 mg Oral Daily  . saccharomyces boulardii  250 mg  Oral BID     OPDialysis Orders: [place] MWF 4 hrs 180NRe 400/800 120 kg 2.0 K/ 2.25 Ca -Heparin 4000 units IV TIW -Calcitriol 0.25 mcg PO TIW  Problem/Plan: 1.Shock/Cardiac arrest:Required intubation/pressor support. Clinically improved. Off pressors. Received antibiotics 9/8-9/23. Per primary.  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 Lesions do not appear to be worsening. Continue low calcium bath, avoid VDRA. Continue sodium thiosulfate, wound care. 3.ESRD:normalMWF HD.But MWF Sat 2/2 Calciphylaxis/ k 3.7 ,Was receiving serial HD on admission d/t calciphylaxis. Haveadded4th HD on Sat as outpatient.. Plan to continue 4 days/week dialysis as outpatient to improve healing of calciphylaxis wounds.Needs Recliner HD Before dc. Next HD 04/26/2019. 4. Acute respiratory failure with hypoxia. Extubated 9/22. Remains on supp oxygen. CPAP at night.  5.HTN/volume:Hypertensive today. BP 165/93- 162/95. Start amlodipine  5 mg PO q HS. HD 10/05 pre wt not done, Net UF 3 liters, post wt 108 kg. Volume seems OK.  6. Anemia:Hemoglobin9.1 Continue aranesp 150 mcg q Wednesday. 7. Secondary hyperparathyroidism:Avoid calcium/vit D in setting of calciphylaxis Continue auryxia.Phos 4.4 Stable  8.Dialysis Access: Patient was scheduled for elective second stage basilic vein transposition on 9/21 which was postponed because of cardiac arrest.  9. GOC - Seen by palliative care 9/26 - Remains full code, full scope of care.  Akylah Hascall H. Kent Braunschweig NP-C 04/25/2019, 9:09 AM  [employer] [phone]

## 2019-04-25 NOTE — Progress Notes (Signed)
Physical Therapy Treatment Patient Details Name: Michael Riggs MRN: [id number] DOB: 05-Feb-1971 Today's Date: 04/25/2019    History of Present Illness Pt adm from SNF with acute hypoxic respiratory failure and metabolic encephalopathy. Patient is a 48 year old male with a complex recent medical history essentially admitted 3 months ago almost continuous stay in the hospital with complications related to renal failure initiation of dialysis.  Intubated from 9/21 to 9/22.  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     PT Comments    Pt requiring max encouragement to participate today but ultimately participated with good effort. Used stedy to trail standing and to complete std pvt to chair. Pt unable to achieve full upright standing without maxAx2 and uanble to tolerate >5 sec due to R LE pain. Began passive bilat knee extension stretching as noted the starting of bilat knee flexion contractures noted. Acute PT to cont to follow.   Follow Up Recommendations  SNF     Equipment Recommendations       Recommendations for Other Services       Precautions / Restrictions Precautions Precautions: Fall Precaution Comments: Lft BKA with prosthesis Restrictions Weight Bearing Restrictions: No    Mobility  Bed Mobility Overal bed mobility: Needs Assistance Bed Mobility: Supine to Sit     Supine to sit: Mod assist     General bed mobility comments: pt able to bring LEs off EOB, pulled up on PT for trunk elevation, good effort today  Transfers Overall transfer level: Needs assistance   Transfers: Sit to/from Stand;Stand Pivot Transfers Sit to Stand: Mod assist;+2 physical assistance Stand pivot transfers: (used stedy)       General transfer comment: modAx2 to stand up in stedy, uanble to achieve terminal knee extension and full upright posture in stedy, completed 2 standing trials, unable to tolerate >5 sec due to R lE pain  Ambulation/Gait              General Gait Details: unable   Stairs             Wheelchair Mobility    Modified Rankin (Stroke Patients Only)       Balance Overall balance assessment: Needs assistance Sitting-balance support: Bilateral upper extremity supported;Feet supported Sitting balance-Leahy Scale: Poor Sitting balance - Comments: dependent on UE support but no physical assist needed                                    Cognition Arousal/Alertness: Awake/alert Behavior During Therapy: WFL for tasks assessed/performed Overall Cognitive Status: Within Functional Limits for tasks assessed                                 General Comments: pt requiring max encouragement to participate, easily redirected and ultimately participated with good effort and appreciated services. depressed mood is noted as he speak moving back here was the worst decision as thats when he go sick and lost his leg      Exercises Other Exercises Other Exercises: passive bilat knee extension stretches, pt noted to have bilat knee contractures beginning.  Other Exercises: initiated quad sets bilat LEs x 5 with hold    General Comments General comments (skin integrity, edema, etc.): VSS      Pertinent Vitals/Pain Pain Assessment: Faces Faces Pain Scale: Hurts even more Pain Location: R  LE with mobility Pain Descriptors / Indicators: Grimacing;Sore;Guarding;Burning Pain Intervention(s): Monitored during session    Home Living                      Prior Function            PT Goals (current goals can now be found in the care plan section) Progress towards PT goals: Progressing toward goals    Frequency    Min 2X/week      PT Plan Current plan remains appropriate    Co-evaluation              AM-PAC PT "6 Clicks" Mobility   Outcome Measure  Help needed turning from your back to your side while in a flat bed without using bedrails?: A Little Help needed  moving from lying on your back to sitting on the side of a flat bed without using bedrails?: A Little Help needed moving to and from a bed to a chair (including a wheelchair)?: Total Help needed standing up from a chair using your arms (e.g., wheelchair or bedside chair)?: Total Help needed to walk in hospital room?: Total Help needed climbing 3-5 steps with a railing? : Total 6 Click Score: 10    End of Session Equipment Utilized During Treatment: Gait belt Activity Tolerance: Patient limited by fatigue Patient left: in chair;with call bell/phone within reach;with chair alarm set Nurse Communication: Mobility status;Need for lift equipment(use stedy to return to bed) PT Visit Diagnosis: Other abnormalities of gait and mobility (R26.89);Muscle weakness (generalized) (M62.81);Pain Pain - Right/Left: Right Pain - part of body: Leg     Time: 0933-1006 PT Time Calculation (min) (ACUTE ONLY): 33 min  Charges:  $Therapeutic Exercise: 8-22 mins $Therapeutic Activity: 8-22 mins                     Kittie Plater, PT, DPT Acute Rehabilitation Services Pager #: [phone] Office #: [phone]    [place] Lopes 04/25/2019, 2:46 PM

## 2019-04-25 NOTE — Progress Notes (Addendum)
PROGRESS NOTE    Michael Riggs  [name] DOB: 10-Nov-1970 DOA: 03/28/2019 PCP: [name], MD   Brief Narrative: 48 year old with past medical history significant for end-stage renal disease on hemodialysis TTS, hypertension, hyperlipidemia who was sent from a skilled nursing facility for evaluation of fever, confusion and right lower extremity pain concerning for infection.  Over the last 2 months he had been admitted to the hospital for worsening renal function and fluid overload.  Patient was a started on dialysis and eventually sent to rehab.  This time he was admitted for sepsis secondary to right lower extremity cellulitis with concern for necrotic tissue.  Orthopedic and plastic surgery was consulted they recommend medical management and have Dr. Sharol Given follow. Hospital course complicated by profound hypotension and shock with loss of consciousness and require intubation.  Patient received CPR, required intubation.  Transfer to the ICU.  He was a started on IV pressors subsequently weaned off IV pressors.  He was extubated on 04/11/2019.     Assessment & Plan:   Principal Problem:   Wound infection Active Problems:   Hypertension   ESRD (end stage renal disease) on dialysis ([place])   Dyslipidemia   Diabetes mellitus with peripheral vascular disease ([place])   Depression   Physical deconditioning   Calciphylaxis   Palliative care encounter   Goals of care, counseling/discussion    Acute Hypoxic Respiratory Failure Resolved Patient lost consciousness on 9/21, became severely hypotensive.  CODE BLUE was called and he was intubated and received CPR. Patient was extubated on 9/22 CTA chest negative for acute PE or thoracic aortic dissection, small bilateral pleural effusions right greater than left with dependent atelectasis/consolidation in both lung bases CPAP at HS ordered, continue supplemental oxygen PRN  Acute metabolic/toxic encephalopathy Resolved Multifactorial,  likely due to above in addition to pain medication Monitor closely  Diarrhea Resolved Afebrile, with resolved leukocytosis C. difficile panel negative, GI panel unremarkable Continue imodium PRN, cholestyramine GI consulted, appreciate recs  Shock, ?septic Resolved Patient required IV pressors, currently off pressors 1 of 2 blood cultures positive for staph coagulase negative.  Patient was on IV antibiotics since 9/08---Case was discussed with ID and orthopedic. IV antibiotics discontinued on 04/12/2019  End-stage renal disease on HD Nephrology on board Patient with immature fistula, vascular surgery on board, plan for second stage basilic vein transition fistula as an outpatient Continue dialysis through hemodialysis tunneled catheter Needs to be able to sit in a recliner for dialysis so as to aid transition into outpatient dialysis  Hypertension Uncontrolled Started amlodipine  Right lower extremity wound, likely calciphylaxis Dr Sharol Given wants to continue with conservative management Continue sodium thiosulfate during dialysis Palliative care consulted, wants to be full code  Anemia of chronic kidney disease Stable Daily CBC  Obesity Lifestyle modification advised  Peripheral vascular disease, status post left AKA.  Pressure Injury 02/27/19 Penis WOC evaluation day of PIP data, this wound is trauma related to [facility] insertions and removals and documented abnormal male penile tissue per urology (Active)  02/27/19 1000  Location: Penis  Location Orientation:   Staging:   Wound Description (Comments): WOC evaluation day of PIP data, this wound is trauma related to [facility] insertions and removals and documented abnormal male penile tissue per urology  Present on Admission: No     Nutrition Problem: Increased nutrient needs Etiology: wound healing    Signs/Symptoms: estimated needs    Interventions: Nepro shake, MVI, Prostat  Estimated body mass index is 34.16 kg/m as  calculated  from the following:   Height as of this encounter: 5\' 10"  (1.778 m).   Weight as of this encounter: 108 kg.   DVT prophylaxis: Heparin Sargeant Code Status: Full code Family Communication: None at bedside Disposition Plan: SNF, pending authorization. Please check in with SW when to order covid for discharge Consultants:   Dr. Sharol Given  Nephrology  PCCM  Procedures:  None  Antimicrobials:  None   Subjective: Patient seen and examined at bedside.  Denies any new complaints.  Has been able to sit in recliner for physical therapy.   Objective: Vitals:   04/24/19 2104 04/25/19 0613 04/25/19 1426 04/25/19 1535  BP: (!) 166/94 (!) 165/93 (!) 170/103   Pulse:  93  (!) 101  Resp:  15 13 14   Temp: 97.7 F (36.5 C) 97.8 F (36.6 C) 98.4 F (36.9 C)   TempSrc: Oral Oral Oral   SpO2: 100% 97% 96% 97%  Weight:  108 kg    Height:        Intake/Output Summary (Last 24 hours) at 04/25/2019 1641 Last data filed at 04/24/2019 2200 Gross per 24 hour  Intake 120 ml  Output 3004 ml  Net -2884 ml   Filed Weights   04/24/19 0343 04/25/19 0613  Weight: 112 kg 108 kg    Examination:  General: NAD   Cardiovascular: S1, S2 present  Respiratory: CTAB  Abdomen: Soft, nontender, nondistended, bowel sounds present  Musculoskeletal: S/P L AKA, RLE with dressing C/D/I, no bilateral pedal edema noted  Skin:  Bilateral extensive leg wounds, perirectal skin breakdown  Psychiatry: Normal mood    Data Reviewed: I have personally reviewed following labs and imaging studies  CBC: Recent Labs  Lab 04/19/19 1021 04/20/19 0847 04/21/19 1605  WBC 13.4* 10.2 11.4*  NEUTROABS  --  6.5  --   HGB 8.3* 8.8* 9.1*  HCT 27.5* 28.7* 30.8*  MCV 94.2 93.2 93.3  PLT 344 342 [id number]   Basic Metabolic Panel: Recent Labs  Lab 04/19/19 1021  04/21/19 0751 04/22/19 0349 04/23/19 0757 04/24/19 0537 04/25/19 0341  NA 132*   < > 130* 134* 133* 133* 136  K 3.9   < > 4.3 3.7 4.0 3.3* 4.9   CL 93*   < > 93* 96* 94* 95* 98  CO2 21*   < > 22 21* 19* 23 24  GLUCOSE 124*   < > 119* 106* 83 90 83  BUN 36*   < > 30* 18 28* 18 14  CREATININE 6.23*   < > 5.46* 4.15* 5.63* 4.17* 3.33*  CALCIUM 8.6*   < > 9.0 8.4* 8.9 8.8* 8.5*  PHOS 4.4  --   --   --   --   --   --    < > = values in this interval not displayed.   GFR: Estimated Creatinine Clearance: 33.7 mL/min (A) (by C-G formula based on SCr of 3.33 mg/dL (H)). Liver Function Tests: Recent Labs  Lab 04/19/19 1021  ALBUMIN 1.7*   No results for input(s): LIPASE, AMYLASE in the last 168 hours. No results for input(s): AMMONIA in the last 168 hours. Coagulation Profile: No results for input(s): INR, PROTIME in the last 168 hours. Cardiac Enzymes: No results for input(s): CKTOTAL, CKMB, CKMBINDEX, TROPONINI in the last 168 hours. BNP (last 3 results) No results for input(s): PROBNP in the last 8760 hours. HbA1C: No results for input(s): HGBA1C in the last 72 hours. CBG: No results for input(s): GLUCAP in the last  168 hours. Lipid Profile: No results for input(s): CHOL, HDL, LDLCALC, TRIG, CHOLHDL, LDLDIRECT in the last 72 hours. Thyroid Function Tests: No results for input(s): TSH, T4TOTAL, FREET4, T3FREE, THYROIDAB in the last 72 hours. Anemia Panel: No results for input(s): VITAMINB12, FOLATE, FERRITIN, TIBC, IRON, RETICCTPCT in the last 72 hours. Sepsis Labs: No results for input(s): PROCALCITON, LATICACIDVEN in the last 168 hours.  No results found for this or any previous visit (from the past 240 hour(s)).       Radiology Studies: No results found.      Scheduled Meds: . amLODipine  5 mg Oral QHS  . atorvastatin  40 mg Oral Q2000  . Chlorhexidine Gluconate Cloth  6 each Topical Q0600  . Chlorhexidine Gluconate Cloth  6 each Topical Q0600  . cholestyramine  8 g Oral BID  . darbepoetin (ARANESP) injection - DIALYSIS  150 mcg Intravenous Q Wed-HD  . DULoxetine  40 mg Oral Daily  . feeding supplement  (NEPRO CARB STEADY)  237 mL Oral BID BM  . feeding supplement (PRO-STAT SUGAR FREE 64)  30 mL Oral BID  . ferric citrate  420 mg Oral TID WC  . heparin injection (subcutaneous)  5,000 Units Subcutaneous Q8H  . mouth rinse  15 mL Mouth Rinse BID  . midodrine  10 mg Oral Q M,W,F  . multivitamin  1 tablet Oral QHS  . pantoprazole  40 mg Oral Daily  . saccharomyces boulardii  250 mg Oral BID   Continuous Infusions: . sodium thiosulfate infusion for calciphylaxis 25 g (04/24/19 1645)     LOS: 28 days      Alma Friendly, MD [facility]   If 7PM-7AM, please contact night-coverage [email or web address] 04/25/2019, 4:41 PM

## 2019-04-26 LAB — RENAL FUNCTION PANEL
Albumin: 2 g/dL — ABNORMAL LOW (ref 3.5–5.0)
Anion gap: 19 — ABNORMAL HIGH (ref 5–15)
BUN: 24 mg/dL — ABNORMAL HIGH (ref 6–20)
CO2: 19 mmol/L — ABNORMAL LOW (ref 22–32)
Calcium: 8.9 mg/dL (ref 8.9–10.3)
Chloride: 95 mmol/L — ABNORMAL LOW (ref 98–111)
Creatinine, Ser: 4.71 mg/dL — ABNORMAL HIGH (ref 0.61–1.24)
GFR calc Af Amer: 16 mL/min — ABNORMAL LOW (ref >60)
GFR calc non Af Amer: 14 mL/min — ABNORMAL LOW (ref >60)
Glucose, Bld: 90 mg/dL (ref 70–99)
Phosphorus: 4.1 mg/dL (ref 2.5–4.6)
Potassium: 4.1 mmol/L (ref 3.5–5.1)
Sodium: 133 mmol/L — ABNORMAL LOW (ref 135–145)

## 2019-04-26 LAB — BASIC METABOLIC PANEL
Anion gap: 15 (ref 5–15)
BUN: 22 mg/dL — ABNORMAL HIGH (ref 6–20)
CO2: 22 mmol/L (ref 22–32)
Calcium: 8.9 mg/dL (ref 8.9–10.3)
Chloride: 95 mmol/L — ABNORMAL LOW (ref 98–111)
Creatinine, Ser: 4.59 mg/dL — ABNORMAL HIGH (ref 0.61–1.24)
GFR calc Af Amer: 16 mL/min — ABNORMAL LOW (ref >60)
GFR calc non Af Amer: 14 mL/min — ABNORMAL LOW (ref >60)
Glucose, Bld: 79 mg/dL (ref 70–99)
Potassium: 4.1 mmol/L (ref 3.5–5.1)
Sodium: 132 mmol/L — ABNORMAL LOW (ref 135–145)

## 2019-04-26 LAB — CBC WITH DIFFERENTIAL/PLATELET
Abs Immature Granulocytes: 0.09 10*3/uL — ABNORMAL HIGH (ref 0.00–0.07)
Basophils Absolute: 0.1 10*3/uL (ref 0.0–0.1)
Basophils Relative: 1 %
Eosinophils Absolute: 0.4 10*3/uL (ref 0.0–0.5)
Eosinophils Relative: 5 %
HCT: 31.3 % — ABNORMAL LOW (ref 39.0–52.0)
Hemoglobin: 9.7 g/dL — ABNORMAL LOW (ref 13.0–17.0)
Immature Granulocytes: 1 %
Lymphocytes Relative: 29 %
Lymphs Abs: 2.4 10*3/uL (ref 0.7–4.0)
MCH: 28.8 pg (ref 26.0–34.0)
MCHC: 31 g/dL (ref 30.0–36.0)
MCV: 92.9 fL (ref 80.0–100.0)
Monocytes Absolute: 0.9 10*3/uL (ref 0.1–1.0)
Monocytes Relative: 11 %
Neutro Abs: 4.4 10*3/uL (ref 1.7–7.7)
Neutrophils Relative %: 53 %
Platelets: 387 10*3/uL (ref 150–400)
RBC: 3.37 MIL/uL — ABNORMAL LOW (ref 4.22–5.81)
RDW: 19 % — ABNORMAL HIGH (ref 11.5–15.5)
WBC: 8.3 10*3/uL (ref 4.0–10.5)
nRBC: 0 % (ref 0.0–0.2)

## 2019-04-26 MED ORDER — MIDODRINE HCL 10 MG PO TABS: 10.0000 mg | ORAL_TABLET | ORAL | 0 refills | Status: DC

## 2019-04-26 MED ORDER — HEPARIN SODIUM (PORCINE) 1000 UNIT/ML IJ SOLN
INTRAMUSCULAR | Status: AC
  Administered 2019-04-26: 3800 [IU]
  Filled 2019-04-26: qty 4

## 2019-04-26 MED ORDER — AMLODIPINE BESYLATE 5 MG PO TABS: 5.0000 mg | ORAL_TABLET | Freq: Every day | ORAL | 0 refills | Status: DC

## 2019-04-26 MED ORDER — GERHARDT'S BUTT CREAM: 1.0000 "application " | TOPICAL_CREAM | Freq: Three times a day (TID) | CUTANEOUS | 0 refills | Status: DC | PRN

## 2019-04-26 MED ORDER — DARBEPOETIN ALFA 150 MCG/0.3ML IJ SOSY
PREFILLED_SYRINGE | INTRAMUSCULAR | Status: AC
  Administered 2019-04-26: 150 ug via INTRAVENOUS
  Filled 2019-04-26: qty 0.3

## 2019-04-26 MED ORDER — TRAMADOL HCL 50 MG PO TABS: 50.0000 mg | ORAL_TABLET | Freq: Four times a day (QID) | ORAL | 0 refills | Status: DC | PRN

## 2019-04-26 MED ORDER — CHOLESTYRAMINE 4 G PO PACK: 8.0000 g | PACK | Freq: Two times a day (BID) | ORAL | 0 refills | Status: DC

## 2019-04-26 MED ORDER — OXYCODONE HCL 5 MG PO TABS: 5.0000 mg | ORAL_TABLET | Freq: Four times a day (QID) | ORAL | 0 refills | Status: DC | PRN

## 2019-04-26 MED ORDER — HYDROCORTISONE ACETATE 25 MG RE SUPP: 25.0000 mg | Freq: Two times a day (BID) | RECTAL | 0 refills | Status: DC | PRN

## 2019-04-26 MED ORDER — DULOXETINE HCL 40 MG PO CPEP: 40.0000 mg | ORAL_CAPSULE | Freq: Every day | ORAL | 0 refills | Status: DC

## 2019-04-26 NOTE — Progress Notes (Signed)
Nutrition Follow-up  DOCUMENTATION CODES:   Morbid obesity  INTERVENTION:   -Nepro Shake po BID, each supplement provides 425 kcal and 19 grams protein -Prostat liquid protein PO 30 ml BID with meals, each supplement provides 100 kcal, 15 grams protein. -Rena-Vit daily  NUTRITION DIAGNOSIS:   Increased nutrient needs related to wound healing as evidenced by estimated needs.  Ongoing.  GOAL:   Patient will meet greater than or equal to 90% of their needs  Progressing.  MONITOR:   PO intake, Supplement acceptance, Labs, Weight trends, I & O's, Skin  ASSESSMENT:   48 year old with history of ESRD on TTS HD, hypertension, hyperlipidemia sent from SNF for evaluation of fever, confusion right lower extremity pain concerning for infection.  9/21- cardiac arrest, intubated 9/22- extubated  9/24- diet advanced to renal/CHO modified  **RD working remotely**  Patient had HD this morning. Currently consuming 100% of meals at this time. Pt is drinking Nepro and Prostat supplements. Per chart review, pt expected to discharge 10/8.  EDW: 120 kg (264 lbs) Admission weight: 271 lbs. Current weight: 246 lbs.   I/Os: -12.2L since 9/23 Last HD 10/7: 2500 ml net UF  Medications: Rena-Vit tablet daily, Ferric citrate, Florastor Labs reviewed: Low Na  Diet Order:   Diet Order            Diet renal 60/70-08-21-1198        Diet renal/carb modified with fluid restriction Diet-HS Snack? Nothing; Fluid restriction: 1200 mL Fluid; Room service appropriate? Yes; Fluid consistency: Thin  Diet effective now              EDUCATION NEEDS:   No education needs have been identified at this time  Skin:  Skin Assessment: Reviewed RN Assessment Skin Integrity Issues:: Other (Comment) Other: R leg/R pretibial wound, L leg lesion (calciphylaxis), pressure injury penis  Last BM:  10/4  Height:   Ht Readings from Last 1 Encounters:  04/10/19 5\' 10"  (1.778 m)    Weight:   Wt  Readings from Last 1 Encounters:  04/26/19 112 kg    Ideal Body Weight:  71 kg(Adjusted for L BKA)  BMI:  Body mass index is 35.43 kg/m.  Estimated Nutritional Needs:   Kcal:  2200-2400 kcal  Protein:  110-130 grams  Fluid:  1000 ml + UOP  Clayton Bibles, MS, RD, LDN Inpatient Clinical Dietitian Pager: [phone] After Hours Pager: [phone]

## 2019-04-26 NOTE — Progress Notes (Signed)
Renal Navigator called CSW/B. Cobb for update on patient's SNF auth. She states patient has received auth to return to [facility]. Renal Navigator has now confirmed that [facility] can transport patient on Saturdays to OP HD clinic for HD 4x per week per [facility] staff. Renal Navigator called OP HD clinic/[place] and confirmed that patient is returning to [facility] and will have transportation to clinic 4x per week. Navigator asked that medication now be ordered as clinic was waiting for confirmation of patient's return, since this is a very expensive medication that is not kept on hand. Per clinic staff, medication will be ordered today.  COVID test can be ordered today and patient can discharge tomorrow (pending COVID result) for return to OP HD clinic for treatment starting Friday.  Alphonzo Cruise, [place] Renal Navigator [phone]

## 2019-04-26 NOTE — TOC Progression Note (Addendum)
Transition of Care [facility]) - Progression Note    Patient Details  Name: Elijaha Goldson MRN: [id number] Date of Birth: Aug 13, 1970  [facility] CM/SW Contact  Eileen Stanford, LCSW Phone Number: 04/26/2019, 11:55 AM  Clinical Narrative:   Pt has received [employer] auth to admit to [facility] today. Pt now in HD. MD notified.   Pt will need updated COVID and meds ordered by HD clinic, pt could d/c tomorrow if meds are available. HD coordinator working on this.    Expected Discharge Plan: [place] Barriers to Discharge: Continued Medical Work up  Expected Discharge Plan and Services Expected Discharge Plan: [place] In-house Referral: Clinical Social Work Discharge Planning Services: NA Post Acute Care Choice: [place] Living arrangements for the past 2 months: [place] Expected Discharge Date: 04/26/19               DME Arranged: N/A DME Agency: NA       HH Arranged: NA HH Agency: NA         Social Determinants of Health (SDOH) Interventions    Readmission Risk Interventions No flowsheet data found.

## 2019-04-26 NOTE — Progress Notes (Signed)
Report given to Marlow & transferred pt.in bed by Brittney CN.to room 5 MW-11 .

## 2019-04-26 NOTE — Progress Notes (Signed)
OT Cancellation Note  Patient Details Name: Michael Riggs MRN: [id number] DOB: March 08, 1971   Cancelled Treatment:    Reason Eval/Treat Not Completed: Other (comment) Pt declined OT session this afternoon; requesting time to visit with his brother. Will check back as time allows.   [place], COTA/L Acute Rehabilitation Services [place] 04/26/2019, 3:44 PM

## 2019-04-26 NOTE — Progress Notes (Signed)
[facility] Progress Note   Subjective: Seen on HD, tolerating well. No new complaints. Started amlodipine last PM.   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 Multiple wounds with eschar but look better than when last seen 2 weeks ago. Dialysis Access:RIJ TDC Drsg CDI Blood lines connected.  L AVF + bruit.   Additional Objective Labs: Basic Metabolic Panel: Recent Labs  Lab 04/19/19 1021  04/24/19 0537 04/25/19 0341 04/26/19 0232  NA 132*   < > 133* 136 132*  K 3.9   < > 3.3* 4.9 4.1  CL 93*   < > 95* 98 95*  CO2 21*   < > 23 24 22   GLUCOSE 124*   < > 90 83 79  BUN 36*   < > 18 14 22*  CREATININE 6.23*   < > 4.17* 3.33* 4.59*  CALCIUM 8.6*   < > 8.8* 8.5* 8.9  PHOS 4.4  --   --   --   --    < > = values in this interval not displayed.   Liver Function Tests: Recent Labs  Lab 04/19/19 1021  ALBUMIN 1.7*   No results for input(s): LIPASE, AMYLASE in the last 168 hours. CBC: Recent Labs  Lab 04/19/19 1021 04/20/19 0847 04/21/19 1605 04/26/19 0232  WBC 13.4* 10.2 11.4* 8.3  NEUTROABS  --  6.5  --  4.4  HGB 8.3* 8.8* 9.1* 9.7*  HCT 27.5* 28.7* 30.8* 31.3*  MCV 94.2 93.2 93.3 92.9  PLT 344 342 394 387   Blood Culture    Component Value Date/Time   SDES TRACHEAL ASPIRATE 04/11/2019 1020   SPECREQUEST NONE 04/11/2019 1020   CULT  04/11/2019 1020    RARE Consistent with normal respiratory flora. Performed at [facility], [place] [address], [place], [place] [address]    REPTSTATUS 04/14/2019 FINAL 04/11/2019 1020    Cardiac Enzymes: No results for input(s): CKTOTAL,  CKMB, CKMBINDEX, TROPONINI in the last 168 hours. CBG: No results for input(s): GLUCAP in the last 168 hours. Iron Studies: No results for input(s): IRON, TIBC, TRANSFERRIN, FERRITIN in the last 72 hours. @lablastinr3 @ Studies/Results: No results found. Medications: . sodium thiosulfate infusion for calciphylaxis 25 g (04/24/19 1645)   . amLODipine  5 mg Oral QHS  . atorvastatin  40 mg Oral Q2000  . Chlorhexidine Gluconate Cloth  6 each Topical Q0600  . Chlorhexidine Gluconate Cloth  6 each Topical Q0600  . cholestyramine  8 g Oral BID  . darbepoetin (ARANESP) injection - DIALYSIS  150 mcg Intravenous Q Wed-HD  . DULoxetine  40 mg Oral Daily  . feeding supplement (NEPRO CARB STEADY)  237 mL Oral BID BM  . feeding supplement (PRO-STAT SUGAR FREE 64)  30 mL Oral BID  . ferric citrate  420 mg Oral TID WC  . heparin injection (subcutaneous)  5,000 Units Subcutaneous Q8H  . mouth rinse  15 mL Mouth Rinse BID  . midodrine  10 mg Oral Q M,W,F  . multivitamin  1 tablet Oral QHS  . pantoprazole  40 mg Oral Daily  . saccharomyces boulardii  250 mg Oral BID     OPDialysis Orders: [facility] MWF 4 hrs 180NRe 400/800 120 kg 2.0 K/ 2.25 Ca -Heparin 4000 units IV TIW -Calcitriol 0.25 mcg PO TIW  Problem/Plan: 1.Shock/Cardiac arrest:Required intubation/pressor support. Clinically improved. Off pressors. Received antibiotics 9/8-9/23. Per primary.  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 Lesions do not appear to be worsening. Continue low calcium bath, avoid VDRA. Continue sodium thiosulfate, wound care. 3.ESRD:normalMWF HD.But MWF Sat 2/2 Calciphylaxis/ k 3.7 ,Was receiving serial HD on admission d/t calciphylaxis. Haveadded4th HD on Sat as outpatient.. Plan to continue 4 days/week dialysis as outpatient to improve healing of calciphylaxis wounds.Needs Recliner HD Before dc. Next HD 04/26/2019. 4. Acute respiratory failure  with hypoxia. Extubated 9/22. Remains on supp oxygen. CPAP at night.  5.HTN/volume:Hypertensive today. BP 165/93- 162/95. Started amlodipine 5 mg PO q HS uptitrate as appropriate. UFG 2.5 liters.  6. Anemia:Hemoglobin ^9.7 Continue aranesp 150 mcg q Wednesday. 7. Secondary hyperparathyroidism:Avoid calcium/vit D in setting of calciphylaxis Continue auryxia.No recent RFP-added today.  8.Dialysis Access: Patient was scheduled for elective second stage basilic vein transposition on 9/21 which was postponed because of cardiac arrest.  9. GOC - Seen by palliative care 9/26 - Remains full code, full scope of care.  Michael Duarte H. Sanchez Hemmer NP-C 04/26/2019, 9:31 AM  [employer] [phone]

## 2019-04-26 NOTE — Procedures (Signed)
Patient seen and examined on Hemodialysis. BP (!) 163/98 (BP Location: Right Arm)   Pulse 97   Temp 97.7 F (36.5 C) (Axillary)   Resp 17   Ht 5\' 10"  (1.778 m)   Wt 112 kg   SpO2 100%   BMI 35.43 kg/m   QB 400 mL/ min via R TDC UF goal 3L  Tolerating treatment without complaints at this time.  In recliner this AM and doing well.     Madelon Lips MD [facility] pgr [phone] 9:57 AM

## 2019-04-26 NOTE — Progress Notes (Signed)
Occupational Therapy Treatment Patient Details Name: Michael Riggs MRN: [id number] DOB: 08/27/1970 Today's Date: 04/26/2019    History of present illness Pt adm from SNF with acute hypoxic respiratory failure and metabolic encephalopathy. Patient is a 48 year old male with a complex recent medical history essentially admitted 3 months ago almost continuous stay in the hospital with complications related to renal failure initiation of dialysis.  Intubated from 9/21 to 9/22.  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    OT comments  Pt initially hesitant to participate in session, but ultimately agreeable. Pt MIN A for bed mobility needing assist from therapist to elevate trunk. Pt declined donning prosthetic for sit>stand d/t wounds on LLE. Pt MAX A +2 with stedy x2 trials. Pt requires increased assist to shift weight anteriorly, unable to maintain stand for more than a couple seconds before fatiguing and needing to sit back down. Pt likely to DC to SNF tomorrow, but will continue to follow acutely.   Follow Up Recommendations  SNF;Supervision/Assistance - 24 hour    Equipment Recommendations  Other (comment)(tbd to next venue)    Recommendations for Other Services      Precautions / Restrictions Precautions Precautions: Fall Precaution Comments: Lft BKA with prosthesis; unable to apply prosthetic d/t wounds on LLE Restrictions Weight Bearing Restrictions: No       Mobility Bed Mobility Overal bed mobility: Needs Assistance Bed Mobility: Supine to Sit;Sit to Supine     Supine to sit: Min assist Sit to supine: Supervision   General bed mobility comments: MIN physical assist from therapist to elevate trunk and scoot hips forward;pt supervision for sit>supine as impulsivley laid back down  Transfers Overall transfer level: Needs assistance   Transfers: Sit to/from Stand Sit to Stand: Max assist;+2 physical assistance         General transfer  comment: stood x2 trials with stedy; MAX A +2 for sit>stand; increased assist to shift weight anteriorly    Balance Overall balance assessment: Needs assistance Sitting-balance support: Bilateral upper extremity supported;Feet supported Sitting balance-Leahy Scale: Poor Sitting balance - Comments: dependent on UE support but no physical assist needed   Standing balance support: Bilateral upper extremity supported Standing balance-Leahy Scale: Poor Standing balance comment: reliant on BUE support                           ADL either performed or assessed with clinical judgement   ADL                                         General ADL Comments: declined ADL participation d/t pain; session focus on sit>stand as precursor to higher level ADLs     Vision Baseline Vision/History: Wears glasses Wears Glasses: At all times     Perception     Praxis      Cognition Arousal/Alertness: Awake/alert Behavior During Therapy: [facility] for tasks assessed/performed;Agitated Overall Cognitive Status: Within Functional Limits for tasks assessed                                 General Comments: pt required MAX encouragment to participate, and was slightly agitated at start of session. Utlimately participated with good effort and appreciated session.        Exercises  Shoulder Instructions       General Comments      Pertinent Vitals/ Pain       Pain Assessment: Faces Faces Pain Scale: Hurts even more Pain Location: R LE with mobility Pain Descriptors / Indicators: Grimacing;Sore;Guarding;Burning Pain Intervention(s): Monitored during session;Limited activity within patient's tolerance;Repositioned  Home Living                                          Prior Functioning/Environment              Frequency  Min 2X/week        Progress Toward Goals  OT Goals(current goals can now be found in the care plan  section)  Progress towards OT goals: Progressing toward goals  Acute Rehab OT Goals Patient Stated Goal: decrease pain OT Goal Formulation: With patient Time For Goal Achievement: 04/26/19 Potential to Achieve Goals: [place] Discharge plan remains appropriate    Co-evaluation                 AM-PAC OT "6 Clicks" Daily Activity     Outcome Measure   Help from another person eating meals?: A Little Help from another person taking care of personal grooming?: A Lot Help from another person toileting, which includes using toliet, bedpan, or urinal?: Total Help from another person bathing (including washing, rinsing, drying)?: A Lot Help from another person to put on and taking off regular upper body clothing?: A Lot Help from another person to put on and taking off regular lower body clothing?: Total 6 Click Score: 11    End of Session Equipment Utilized During Treatment: Gait belt;Other (comment)(stedy)  OT Visit Diagnosis: Unsteadiness on feet (R26.81);Other abnormalities of gait and mobility (R26.89);Muscle weakness (generalized) (M62.81);Other symptoms and signs involving cognitive function;Pain Pain - Right/Left: Right Pain - part of body: Leg   Activity Tolerance Patient tolerated treatment well   Patient Left in bed;with call bell/phone within reach   Nurse Communication Mobility status        Time: [id number] OT Time Calculation (min): 26 min  Charges: OT General Charges $OT Visit: 1 Visit OT Treatments $Therapeutic Activity: 23-37 mins  [place], [place] [phone] [place] 04/26/2019, 5:29 PM

## 2019-04-26 NOTE — Discharge Summary (Addendum)
Physician Discharge Summary  Michael Riggs OYD:[id number] DOB: 1971/06/29 DOA: 03/28/2019  PCP: [name], MD  Admit date: 03/28/2019 Discharge date: 04/27/2019  Disposition:  SNF   Recommendations for Outpatient Follow-up:  1. Follow up with PCP in 1 week 2. Follow-up with Dr. Sharol Given 3. Follow-up with vascular surgery for second stage basilic vein transition fistula 4. Wound care: Lower extremity calciphylaxis, continue local care with Xeroform dressing changes daily, sodium thiosulfate at dialysis  Discharge Condition: Stable CODE STATUS: Full  Diet recommendation: Renal diet   Brief/Interim Summary: Michael Riggs is a 48 year old with past medical history significant for end-stage renal disease on hemodialysis TTS, hypertension, hyperlipidemia who was sent from a skilled nursing facility for evaluation of fever, confusion and right lower extremity pain concerning for infection.  Over the last 2 months he had been admitted to the hospital for worsening renal function and fluid overload.  Patient was a started on dialysis and eventually sent to rehab.  This time he was admitted for sepsis secondary to right lower extremity cellulitis with concern for necrotic tissue.  Orthopedic and plastic surgery was consulted they recommend medical management and have Dr. Sharol Given follow. Hospital course complicated by profound hypotension and shock with loss of consciousness and require intubation.  Patient received CPR, required intubation.  Transfer to the ICU.  He was a started on IV pressors subsequently weaned off IV pressors.  He was extubated on 04/11/2019.  Patient was stabilized, ready for discharge to SNF on 10/7.  Discharge Diagnoses:  Principal Problem:   Wound infection Active Problems:   Hypertension   ESRD (end stage renal disease) on dialysis ([name])   Dyslipidemia   Diabetes mellitus with peripheral vascular disease ([name])   Depression   Physical deconditioning   Calciphylaxis    Palliative care encounter   Goals of care, counseling/discussion   Acute Hypoxic Respiratory Failure - Resolved Patient lost consciousness on 9/21, became severely hypotensive.  CODE BLUE was called and he was intubated and received CPR. Patient was extubated on 9/22 CTA chest negative for acute PE or thoracic aortic dissection, small bilateral pleural effusions right greater than left with dependent atelectasis/consolidation in both lung bases CPAP at HS ordered, continue supplemental oxygen PRN  Acute metabolic/toxic encephalopathy - Resolved Multifactorial, likely due to above in addition to pain medication Monitor closely  Diarrhea - Resolved Afebrile, with resolved leukocytosis C. difficile panel negative, GI panel unremarkable Continue imodium PRN, cholestyramine GI consulted, appreciate recs.  Follow-up with GI as needed.  Shock, ?septic - Resolved Patient required IV pressors, currently off pressors 1 of 2 blood cultures positive for staph coagulase negative.  Patient was on IV antibiotics since 9/08---Case was discussed with ID and orthopedic. IV antibiotics discontinued on 04/12/2019.  End-stage renal disease on HD Nephrology on board Patient with immature fistula, vascular surgery on board, plan for second stage basilic vein transition fistula as an outpatient Continue dialysis through hemodialysis tunneled catheter  Hypertension Started amlodipine  Right lower extremity wound, likely calciphylaxis Dr Sharol Given wants to continue with conservative management Continue sodium thiosulfate during dialysis Palliative care consulted, wants to be full code  Anemia of chronic kidney disease Stable  Obesity Estimated body mass index is 35.43 kg/m as calculated from the following:   Height as of this encounter: '5\' 10"'  (1.778 m).   Weight as of this encounter: 112 kg.  Peripheral vascular disease Status post left BKA   In agreement with assessment of the pressure  ulcer as below:  Pressure Injury 02/27/19 Penis WOC evaluation day of PIP data, this wound is trauma related to [facility] insertions and removals and documented abnormal male penile tissue per urology (Active)  02/27/19 1000  Location: Penis  Location Orientation:   Staging:   Wound Description (Comments): WOC evaluation day of PIP data, this wound is trauma related to [facility] insertions and removals and documented abnormal male penile tissue per urology  Present on Admission: No    Discharge Instructions  Discharge Instructions    Diet renal [id number]   Complete by: As directed    Increase activity slowly   Complete by: As directed      Allergies as of 04/26/2019   No Known Allergies     Medication List    STOP taking these medications   bisacodyl 5 MG EC tablet Commonly known as: DULCOLAX   citalopram 40 MG tablet Commonly known as: CELEXA   furosemide 80 MG tablet Commonly known as: LASIX   gabapentin 300 MG capsule Commonly known as: NEURONTIN   methocarbamol 500 MG tablet Commonly known as: ROBAXIN     TAKE these medications   acetaminophen 325 MG tablet Commonly known as: TYLENOL Take 2 tablets (650 mg total) by mouth every 6 (six) hours as needed for mild pain (or Fever >/= 101).   albuterol 108 (90 Base) MCG/ACT inhaler Commonly known as: VENTOLIN HFA Inhale 2 puffs into the lungs every 6 (six) hours as needed for wheezing or shortness of breath.   amLODipine 5 MG tablet Commonly known as: NORVASC Take 1 tablet (5 mg total) by mouth at bedtime.   atorvastatin 40 MG tablet Commonly known as: LIPITOR Take 1 tablet (40 mg total) by mouth daily.   bethanechol 25 MG tablet Commonly known as: URECHOLINE Take 1 tablet (25 mg total) by mouth 3 (three) times daily.   cholestyramine 4 g packet Commonly known as: QUESTRAN Take 2 packets (8 g total) by mouth 2 (two) times daily.   DULoxetine HCl 40 MG Cpep Take 40 mg by mouth daily.   Gerhardt's butt cream  Crea Apply 1 application topically 3 (three) times daily as needed for irritation.   hydrocortisone 25 MG suppository Commonly known as: ANUSOL-HC Place 1 suppository (25 mg total) rectally 2 (two) times daily as needed for hemorrhoids or anal itching.   Lidocaine-Prilocaine &Lido HCl 2.5-2.5 & 3.88 % Kit Apply 1 application topically every Monday, Wednesday, and Friday. Apply to fistula and wrap with saran wrap site prior to dialysys   Melatonin 3 MG Tabs Take 1.5 tablets (4.5 mg total) by mouth at bedtime.   midodrine 10 MG tablet Commonly known as: PROAMATINE Take 1 tablet (10 mg total) by mouth every Monday, Wednesday, and Friday.   oxyCODONE 5 MG immediate release tablet Commonly known as: Oxy IR/ROXICODONE Take 1 tablet (5 mg total) by mouth every 6 (six) hours as needed for severe pain or breakthrough pain. What changed:   when to take this  reasons to take this  additional instructions   pantoprazole 40 MG tablet Commonly known as: PROTONIX Take 1 tablet (40 mg total) by mouth daily.   polyethylene glycol 17 g packet Commonly known as: MIRALAX / GLYCOLAX Take 17 g by mouth daily. What changed:   when to take this  reasons to take this  additional instructions   tamsulosin 0.4 MG Caps capsule Commonly known as: FLOMAX Take 1 capsule (0.4 mg total) by mouth daily after breakfast.   traMADol 50 MG tablet Commonly known  as: ULTRAM Take 1 tablet (50 mg total) by mouth every 6 (six) hours as needed for moderate pain.       Contact information for follow-up providers    [name], MD Follow up.   Specialty: Internal Medicine Contact information: [address] [place] [address] [phone]        [name], MD Follow up.   Specialty: Orthopedic Surgery Contact information: [place] [address] [phone]            Contact information for after-discharge care    Destination    [facility] Preferred SNF .   Service: Skilled Nursing Contact information: [address] [place] [address] [phone]                 No Known Allergies  Consultations:  Nephrology  Orthopedic surgery  Palliative care medicine  GI  PCCM  General surgery   Procedures/Studies: Dg Tibia/fibula Right  Result Date: 03/28/2019 CLINICAL DATA:  Right leg pain and burning sensation EXAM: RIGHT TIBIA AND FIBULA - 2 VIEW COMPARISON:  None. FINDINGS: There is no evidence of fracture or other focal bone lesions. Soft tissues are unremarkable. No soft tissue emphysema. Peripheral vascular atherosclerotic disease. IMPRESSION: No acute osseous abnormality of the right tibia and fibula. Electronically Signed   By: Kathreen Devoid   On: 03/28/2019 06:54   Ct Head Wo Contrast  Result Date: 04/10/2019 CLINICAL DATA:  Altered mental status with unclear cause EXAM: CT HEAD WITHOUT CONTRAST TECHNIQUE: Contiguous axial images were obtained from the base of the skull through the vertex without intravenous contrast. COMPARISON:  None. FINDINGS: Brain: No evidence of acute infarction, hemorrhage, hydrocephalus, extra-axial collection or mass lesion/mass effect. 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 Vascular: No hyperdense vessel. End-stage renal disease with extensive vessel calcification in the scalp. Skull: Normal. Negative for fracture or focal lesion. Sinuses/Orbits: No acute finding. IMPRESSION: No acute finding. Electronically Signed   By: Monte Fantasia M.D.   On: 04/10/2019 06:02   Ct Angio Chest Pe W Or Wo Contrast  Result Date: 04/12/2019 CLINICAL DATA:  Shortness of breath, chest pain EXAM: CT ANGIOGRAPHY CHEST WITH CONTRAST TECHNIQUE: Multidetector CT imaging of the chest was performed using the standard protocol during bolus administration of intravenous contrast. Multiplanar CT image reconstructions and MIPs were obtained to evaluate the vascular  anatomy. CONTRAST:  175m OMNIPAQUE IOHEXOL 350 MG/ML SOLN COMPARISON:  None. FINDINGS: Cardiovascular: Hemodialysis catheter extends to the distal SVC. Heart size upper limits normal. Small pericardial effusion. Dilated main pulmonary artery. Satisfactory opacification of pulmonary arteries noted, and there is no evidence of pulmonary emboli. Motion degradation degrades some of the images. Coronary calcifications. Adequate contrast opacification of the thoracic aorta with no evidence of dissection, aneurysm, or stenosis. There is classic 3-vessel brachiocephalic arch anatomy without proximal stenosis. Mediastinum/Nodes: No hilar or mediastinal adenopathy. Lungs/Pleura: Small pleural effusions right greater than left. Dependent atelectasis/consolidation in both lung bases. No overt interstitial edema. Upper Abdomen: Multiple small partially calcified stones layer in the dependent aspect the gallbladder. No acute findings. Musculoskeletal: No chest wall abnormality. No acute or significant osseous findings. Review of the MIP images confirms the above findings. IMPRESSION: 1. Negative for acute PE or thoracic aortic dissection. 2. Small bilateral pleural effusions right greater than left with dependent atelectasis/consolidation in both lung bases. 3. Coronary artery disease. Please note that although the presence of coronary artery calcium documents the  presence of coronary artery disease, the severity of this disease and any potential stenosis cannot be assessed on this non-gated CT examination. Assessment for potential risk factor modification, dietary therapy or pharmacologic therapy may be warranted, if clinically indicated. 4. Cholelithiasis. Electronically Signed   By: Lucrezia Europe M.D.   On: 04/12/2019 19:28   Dg Chest Port 1 View  Result Date: 04/13/2019 CLINICAL DATA:  Hypoxemia, shortness of breath. EXAM: PORTABLE CHEST 1 VIEW COMPARISON:  Radiographs of April 12, 2019. FINDINGS: Stable cardiomegaly.  No pneumothorax or pleural effusion is noted. Minimal bibasilar subsegmental atelectasis is noted. Right internal jugular dialysis catheter is unchanged in position. Bony thorax is unremarkable. IMPRESSION: Minimal bibasilar subsegmental atelectasis. Electronically Signed   By: Marijo Conception M.D.   On: 04/13/2019 13:38   Dg Chest Port 1 View  Result Date: 04/12/2019 CLINICAL DATA:  Respiratory failure. EXAM: PORTABLE CHEST 1 VIEW COMPARISON:  Radiograph April 10, 2019. FINDINGS: Stable cardiomegaly. Endotracheal and nasogastric tubes have been removed. Right internal jugular dialysis catheter is unchanged. Left internal jugular catheter is noted with tip in left subclavian vein. No pneumothorax or pleural effusion is noted. Mild bibasilar subsegmental atelectasis is noted. Bony thorax unremarkable. IMPRESSION: Endotracheal and nasogastric tubes have been removed. Mild bibasilar subsegmental atelectasis. Electronically Signed   By: Marijo Conception M.D.   On: 04/12/2019 07:32   Dg Chest Port 1 View  Result Date: 04/10/2019 CLINICAL DATA:  Central line placement EXAM: PORTABLE CHEST 1 VIEW COMPARISON:  04/10/2019, 03/28/2019 FINDINGS: Endotracheal tube tip is about 4.5 cm superior to the carina. Esophageal tube tip is below the diaphragm but non included. Right-sided central venous catheter tip at the cavoatrial region. New left IJ central venous catheter with tip visible medial to the aortic arch, presumably over brachiocephalic region. No left pneumothorax. Cardiomegaly with vascular congestion and hazy perihilar edema. New left lung base consolidation, atelectasis versus pneumonia. IMPRESSION: 1. New left IJ central venous catheter with tip visible to the right of the aortic arch, presumably over brachiocephalic region. No left pneumothorax. 2. Support lines and tubes as above 3. Cardiomegaly with vascular congestion and hazy perihilar edema. New consolidation in the left lung base, favor atelectasis.  Electronically Signed   By: Donavan Foil M.D.   On: 04/10/2019 03:18   Dg Chest Port 1 View  Result Date: 04/10/2019 CLINICAL DATA:  Intubation. EXAM: PORTABLE CHEST 1 VIEW COMPARISON:  Radiograph 03/28/2019 FINDINGS: Endotracheal tube tip 4.9 cm from the carina. Enteric tube in place, tip below the diaphragm not included in the field of view. Right-sided dialysis catheter tip in the SVC. Stable cardiomegaly. Unchanged mediastinal contours. Linear opacity in the right mid lung likely fluid in the fissure. Blunting of the right costophrenic angle. Small peripheral opacity in the left mid lung zone. Overlying artifact limits detailed assessment. Suspect central vascular congestion. No pneumothorax. IMPRESSION: 1. Endotracheal tube tip 4.9 cm from the carina. 2. Enteric tube in place, tip below the diaphragm not included in the field of view. 3. Vascular congestion with right pleural effusion of fluid in the fissures. Cardiomegaly is stable. 4. Peripheral opacity in the left mid lung zone nonspecific. This may be infectious, atelectasis, or pleural fluid in the fissure. Electronically Signed   By: Keith Rake M.D.   On: 04/10/2019 01:36   Dg Chest Port 1 View  Result Date: 03/28/2019 CLINICAL DATA:  Acute dyspnea, malaise, cough, chest discomfort EXAM: PORTABLE CHEST 1 VIEW COMPARISON:  03/04/2019 FINDINGS: There is a dual  lumen right-sided central venous catheter with the tip projecting over the SVC. There is no focal consolidation. There is no pleural effusion or pneumothorax. The heart and mediastinal contours are stable. There is no acute osseous abnormality. IMPRESSION: No active disease. Electronically Signed   By: Kathreen Devoid   On: 03/28/2019 06:52   Dg Abd Portable 1v  Result Date: 04/10/2019 CLINICAL DATA:  NG tube placement. EXAM: PORTABLE ABDOMEN - 1 VIEW COMPARISON:  CT 02/11/2019 FINDINGS: Tip and side port of the enteric tube below the diaphragm in the stomach. No small bowel dilatation  in the upper abdomen. Mild gaseous distension of transverse colon which appears similar to prior CT. No evidence of free air. Multiple monitoring devices overlie the lower chest. IMPRESSION: Tip and side port of the enteric tube below the diaphragm in the stomach. Electronically Signed   By: Keith Rake M.D.   On: 04/10/2019 01:37   Vas [place] Lower Extremity Venous (dvt)  Result Date: 04/12/2019  Lower Venous Study Indications: Edema.  Risk Factors: None identified. Limitations: Body habitus, poor ultrasound/tissue interface, bandages and open wound. Comparison Study: No prior studies. Performing Technologist: Oliver Hum RVT  Examination Guidelines: A complete evaluation includes B-mode imaging, spectral Doppler, color Doppler, and power Doppler as needed of all accessible portions of each vessel. Bilateral testing is considered an integral part of a complete examination. Limited examinations for reoccurring indications may be performed as noted.  +---------+---------------+---------+-----------+----------+--------------+ RIGHT    CompressibilityPhasicitySpontaneityPropertiesThrombus Aging +---------+---------------+---------+-----------+----------+--------------+ CFV      Full           Yes      Yes                                 +---------+---------------+---------+-----------+----------+--------------+ SFJ      Full                                                        +---------+---------------+---------+-----------+----------+--------------+ FV Prox  Full                                                        +---------+---------------+---------+-----------+----------+--------------+ FV Mid                                                Not visualized +---------+---------------+---------+-----------+----------+--------------+ FV Distal                                             Not visualized  +---------+---------------+---------+-----------+----------+--------------+ PFV      Full                                                        +---------+---------------+---------+-----------+----------+--------------+  POP      Full           Yes      Yes                                 +---------+---------------+---------+-----------+----------+--------------+ PTV      Full                                                        +---------+---------------+---------+-----------+----------+--------------+ PERO     Full                                                        +---------+---------------+---------+-----------+----------+--------------+   +----+---------------+---------+-----------+----------+--------------+ LEFTCompressibilityPhasicitySpontaneityPropertiesThrombus Aging +----+---------------+---------+-----------+----------+--------------+ CFV Full           Yes      Yes                                 +----+---------------+---------+-----------+----------+--------------+     Summary: Right: There is no evidence of deep vein thrombosis in the lower extremity. However, portions of this examination were limited- see technologist comments above. No cystic structure found in the popliteal fossa. Left: No evidence of common femoral vein obstruction.  *See table(s) above for measurements and observations. Electronically signed by Harold Barban MD on 04/12/2019 at 2:13:57 PM.    Final     Echo 04/11/2019 IMPRESSIONS  1. Left ventricular ejection fraction, by visual estimation, is 55 to 60%. The left ventricle has normal function. Mildly increased left ventricular size. There is no left ventricular hypertrophy.  2. Global right ventricle has normal systolic function.The right ventricular size is mildly enlarged. No increase in right ventricular wall thickness.  3. Left atrial size was normal.  4. Right atrial size was mildly dilated.  5. Trivial pericardial  effusion is present.  6. The mitral valve is normal in structure. No evidence of mitral valve regurgitation. No evidence of mitral stenosis.  7. The tricuspid valve is normal in structure. Tricuspid valve regurgitation is trivial.  8. The aortic valve is normal in structure. Aortic valve regurgitation was not visualized by color flow Doppler. Structurally normal aortic valve, with no evidence of sclerosis or stenosis.  9. The pulmonic valve was normal in structure. Pulmonic valve regurgitation is not visualized by color flow Doppler. 10. Mildly elevated pulmonary artery systolic pressure (estimated systolic PAP 36 mm Hg). 11. The inferior vena cava is dilated in size with <50% respiratory variability, suggesting right atrial pressure of 15 mmHg. 12. IVC dilation is a nonspecific finding if the patient is receiving positive pressure ventilation.    Lower extremity duplex 04/12/2019 Summary: Right: There is no evidence of deep vein thrombosis in the lower extremity. However, portions of this examination were limited- see technologist comments above. No cystic structure found in the popliteal fossa. Left: No evidence of common femoral vein obstruction.   EEG 04/13/2019 DESCRIPTION: EEG showed continuous generalized 2-'6Hz'  theta-delta slowing. EEG was reactive to tactile stimulation. Photic driving was not seen during photic stimulation. Hyperventilation was not performed.  Abnormality 1.  Continuous slow, generalized  IMPRESSION: This study is suggestive of severe diffuse encephalopathy, nonspecific to etiology. No seizures or epileptiform discharges were seen throughout the recording.   Discharge Exam: Vitals:   04/26/19 1000 04/26/19 1030  BP: 123/77 (!) 157/87  Pulse: 99 (!) 101  Resp: 12 17  Temp:  97.8 F (36.6 C)  SpO2:       General: Pt is alert, awake, not in acute distress Cardiovascular: RRR, S1/S2 +, no edema Respiratory: CTA bilaterally, no wheezing, no rhonchi, no  respiratory distress, no conversational dyspnea  Abdominal: Soft, NT, ND, bowel sounds + Extremities: Left BKA, Right LE wrapped in clean and dry dressing  Psych: Normal mood and affect, stable judgement and insight     The results of significant diagnostics from this hospitalization (including imaging, microbiology, ancillary and laboratory) are listed below for reference.     Microbiology: No results found for this or any previous visit (from the past 240 hour(s)).   Labs: BNP (last 3 results) No results for input(s): BNP in the last 8760 hours. Basic Metabolic Panel: Recent Labs  Lab 04/23/19 0757 04/24/19 0537 04/25/19 0341 04/26/19 0232 04/26/19 0951  NA 133* 133* 136 132* 133*  K 4.0 3.3* 4.9 4.1 4.1  CL 94* 95* 98 95* 95*  CO2 19* '23 24 22 ' 19*  GLUCOSE 83 90 83 79 90  BUN 28* 18 14 22* 24*  CREATININE 5.63* 4.17* 3.33* 4.59* 4.71*  CALCIUM 8.9 8.8* 8.5* 8.9 8.9  PHOS  --   --   --   --  4.1   Liver Function Tests: Recent Labs  Lab 04/26/19 0951  ALBUMIN 2.0*   No results for input(s): LIPASE, AMYLASE in the last 168 hours. No results for input(s): AMMONIA in the last 168 hours. CBC: Recent Labs  Lab 04/20/19 0847 04/21/19 1605 04/26/19 0232  WBC 10.2 11.4* 8.3  NEUTROABS 6.5  --  4.4  HGB 8.8* 9.1* 9.7*  HCT 28.7* 30.8* 31.3*  MCV 93.2 93.3 92.9  PLT 342 394 387   Cardiac Enzymes: No results for input(s): CKTOTAL, CKMB, CKMBINDEX, TROPONINI in the last 168 hours. BNP: Invalid input(s): POCBNP CBG: No results for input(s): GLUCAP in the last 168 hours. D-Dimer No results for input(s): DDIMER in the last 72 hours. Hgb A1c No results for input(s): HGBA1C in the last 72 hours. Lipid Profile No results for input(s): CHOL, HDL, LDLCALC, TRIG, CHOLHDL, LDLDIRECT in the last 72 hours. Thyroid function studies No results for input(s): TSH, T4TOTAL, T3FREE, THYROIDAB in the last 72 hours.  Invalid input(s): FREET3 Anemia work up No results for  input(s): VITAMINB12, FOLATE, FERRITIN, TIBC, IRON, RETICCTPCT in the last 72 hours. Urinalysis    Component Value Date/Time   COLORURINE YELLOW 03/10/2019 1054   APPEARANCEUR TURBID (A) 03/10/2019 1054   LABSPEC 1.020 03/10/2019 1054   PHURINE 5.0 03/10/2019 1054   GLUCOSEU 50 (A) 03/10/2019 1054   HGBUR SMALL (A) 03/10/2019 1054   BILIRUBINUR NEGATIVE 03/10/2019 1054   [place] 03/10/2019 1054   PROTEINUR >=300 (A) 03/10/2019 1054   NITRITE NEGATIVE 03/10/2019 1054   LEUKOCYTESUR LARGE (A) 03/10/2019 1054   Sepsis Labs Invalid input(s): PROCALCITONIN,  WBC,  LACTICIDVEN Microbiology No results found for this or any previous visit (from the past 240 hour(s)).   Patient was seen and examined on the day of discharge and was found to be in stable condition. Time coordinating discharge: 35 minutes including assessment and coordination of care,  as well as examination of the patient.   SIGNED:  Dessa Phi, DO [facility] 04/26/2019, 11:55 AM

## 2019-04-27 ENCOUNTER — Inpatient Hospital Stay (HOSPITAL_COMMUNITY): Payer: Medicare Other

## 2019-04-27 DIAGNOSIS — L299 Pruritus, unspecified: Secondary | ICD-10-CM | POA: Diagnosis not present

## 2019-04-27 DIAGNOSIS — E785 Hyperlipidemia, unspecified: Secondary | ICD-10-CM | POA: Diagnosis not present

## 2019-04-27 DIAGNOSIS — R509 Fever, unspecified: Secondary | ICD-10-CM | POA: Diagnosis not present

## 2019-04-27 DIAGNOSIS — R0602 Shortness of breath: Secondary | ICD-10-CM | POA: Diagnosis not present

## 2019-04-27 DIAGNOSIS — R5381 Other malaise: Secondary | ICD-10-CM | POA: Diagnosis not present

## 2019-04-27 DIAGNOSIS — E11622 Type 2 diabetes mellitus with other skin ulcer: Secondary | ICD-10-CM | POA: Diagnosis not present

## 2019-04-27 DIAGNOSIS — K529 Noninfective gastroenteritis and colitis, unspecified: Secondary | ICD-10-CM | POA: Diagnosis not present

## 2019-04-27 DIAGNOSIS — L8990 Pressure ulcer of unspecified site, unspecified stage: Secondary | ICD-10-CM | POA: Insufficient documentation

## 2019-04-27 DIAGNOSIS — D62 Acute posthemorrhagic anemia: Secondary | ICD-10-CM | POA: Diagnosis not present

## 2019-04-27 DIAGNOSIS — N2581 Secondary hyperparathyroidism of renal origin: Secondary | ICD-10-CM | POA: Diagnosis not present

## 2019-04-27 DIAGNOSIS — D638 Anemia in other chronic diseases classified elsewhere: Secondary | ICD-10-CM | POA: Diagnosis not present

## 2019-04-27 DIAGNOSIS — R61 Generalized hyperhidrosis: Secondary | ICD-10-CM | POA: Diagnosis not present

## 2019-04-27 DIAGNOSIS — Z7401 Bed confinement status: Secondary | ICD-10-CM | POA: Diagnosis not present

## 2019-04-27 DIAGNOSIS — I120 Hypertensive chronic kidney disease with stage 5 chronic kidney disease or end stage renal disease: Secondary | ICD-10-CM | POA: Diagnosis not present

## 2019-04-27 DIAGNOSIS — I10 Essential (primary) hypertension: Secondary | ICD-10-CM | POA: Diagnosis not present

## 2019-04-27 DIAGNOSIS — G8929 Other chronic pain: Secondary | ICD-10-CM | POA: Diagnosis not present

## 2019-04-27 DIAGNOSIS — K8020 Calculus of gallbladder without cholecystitis without obstruction: Secondary | ICD-10-CM | POA: Diagnosis not present

## 2019-04-27 DIAGNOSIS — Z03818 Encounter for observation for suspected exposure to other biological agents ruled out: Secondary | ICD-10-CM | POA: Diagnosis not present

## 2019-04-27 DIAGNOSIS — K5900 Constipation, unspecified: Secondary | ICD-10-CM | POA: Diagnosis not present

## 2019-04-27 DIAGNOSIS — R062 Wheezing: Secondary | ICD-10-CM | POA: Diagnosis not present

## 2019-04-27 DIAGNOSIS — E875 Hyperkalemia: Secondary | ICD-10-CM | POA: Diagnosis not present

## 2019-04-27 DIAGNOSIS — Z515 Encounter for palliative care: Secondary | ICD-10-CM | POA: Insufficient documentation

## 2019-04-27 DIAGNOSIS — E1121 Type 2 diabetes mellitus with diabetic nephropathy: Secondary | ICD-10-CM | POA: Diagnosis not present

## 2019-04-27 DIAGNOSIS — F329 Major depressive disorder, single episode, unspecified: Secondary | ICD-10-CM | POA: Insufficient documentation

## 2019-04-27 DIAGNOSIS — K6289 Other specified diseases of anus and rectum: Secondary | ICD-10-CM | POA: Diagnosis not present

## 2019-04-27 DIAGNOSIS — R58 Hemorrhage, not elsewhere classified: Secondary | ICD-10-CM | POA: Diagnosis not present

## 2019-04-27 DIAGNOSIS — D649 Anemia, unspecified: Secondary | ICD-10-CM | POA: Diagnosis not present

## 2019-04-27 DIAGNOSIS — R609 Edema, unspecified: Secondary | ICD-10-CM | POA: Diagnosis not present

## 2019-04-27 DIAGNOSIS — I9589 Other hypotension: Secondary | ICD-10-CM | POA: Diagnosis not present

## 2019-04-27 DIAGNOSIS — R935 Abnormal findings on diagnostic imaging of other abdominal regions, including retroperitoneum: Secondary | ICD-10-CM | POA: Diagnosis not present

## 2019-04-27 DIAGNOSIS — K921 Melena: Secondary | ICD-10-CM | POA: Diagnosis not present

## 2019-04-27 DIAGNOSIS — N186 End stage renal disease: Secondary | ICD-10-CM | POA: Diagnosis not present

## 2019-04-27 DIAGNOSIS — D689 Coagulation defect, unspecified: Secondary | ICD-10-CM | POA: Diagnosis not present

## 2019-04-27 DIAGNOSIS — Z79899 Other long term (current) drug therapy: Secondary | ICD-10-CM | POA: Diagnosis not present

## 2019-04-27 DIAGNOSIS — K625 Hemorrhage of anus and rectum: Secondary | ICD-10-CM | POA: Diagnosis not present

## 2019-04-27 DIAGNOSIS — M2550 Pain in unspecified joint: Secondary | ICD-10-CM | POA: Diagnosis not present

## 2019-04-27 DIAGNOSIS — K219 Gastro-esophageal reflux disease without esophagitis: Secondary | ICD-10-CM | POA: Diagnosis not present

## 2019-04-27 DIAGNOSIS — I959 Hypotension, unspecified: Secondary | ICD-10-CM | POA: Diagnosis not present

## 2019-04-27 DIAGNOSIS — E669 Obesity, unspecified: Secondary | ICD-10-CM | POA: Diagnosis present

## 2019-04-27 DIAGNOSIS — Z23 Encounter for immunization: Secondary | ICD-10-CM | POA: Diagnosis not present

## 2019-04-27 DIAGNOSIS — E1165 Type 2 diabetes mellitus with hyperglycemia: Secondary | ICD-10-CM | POA: Diagnosis not present

## 2019-04-27 DIAGNOSIS — R531 Weakness: Secondary | ICD-10-CM | POA: Diagnosis not present

## 2019-04-27 DIAGNOSIS — Z992 Dependence on renal dialysis: Secondary | ICD-10-CM | POA: Diagnosis not present

## 2019-04-27 DIAGNOSIS — E1169 Type 2 diabetes mellitus with other specified complication: Secondary | ICD-10-CM | POA: Diagnosis not present

## 2019-04-27 DIAGNOSIS — L97819 Non-pressure chronic ulcer of other part of right lower leg with unspecified severity: Secondary | ICD-10-CM | POA: Diagnosis not present

## 2019-04-27 DIAGNOSIS — K626 Ulcer of anus and rectum: Secondary | ICD-10-CM | POA: Diagnosis not present

## 2019-04-27 DIAGNOSIS — D631 Anemia in chronic kidney disease: Secondary | ICD-10-CM | POA: Diagnosis not present

## 2019-04-27 DIAGNOSIS — Z87891 Personal history of nicotine dependence: Secondary | ICD-10-CM | POA: Diagnosis not present

## 2019-04-27 DIAGNOSIS — D509 Iron deficiency anemia, unspecified: Secondary | ICD-10-CM | POA: Diagnosis not present

## 2019-04-27 DIAGNOSIS — Z20828 Contact with and (suspected) exposure to other viral communicable diseases: Secondary | ICD-10-CM | POA: Diagnosis present

## 2019-04-27 DIAGNOSIS — L97829 Non-pressure chronic ulcer of other part of left lower leg with unspecified severity: Secondary | ICD-10-CM | POA: Diagnosis not present

## 2019-04-27 DIAGNOSIS — E1122 Type 2 diabetes mellitus with diabetic chronic kidney disease: Secondary | ICD-10-CM | POA: Diagnosis not present

## 2019-04-27 DIAGNOSIS — E1151 Type 2 diabetes mellitus with diabetic peripheral angiopathy without gangrene: Secondary | ICD-10-CM | POA: Diagnosis not present

## 2019-04-27 DIAGNOSIS — G4730 Sleep apnea, unspecified: Secondary | ICD-10-CM | POA: Diagnosis not present

## 2019-04-27 DIAGNOSIS — L89152 Pressure ulcer of sacral region, stage 2: Secondary | ICD-10-CM | POA: Diagnosis not present

## 2019-04-27 DIAGNOSIS — Z89512 Acquired absence of left leg below knee: Secondary | ICD-10-CM | POA: Diagnosis not present

## 2019-04-27 DIAGNOSIS — R109 Unspecified abdominal pain: Secondary | ICD-10-CM | POA: Diagnosis not present

## 2019-04-27 DIAGNOSIS — K922 Gastrointestinal hemorrhage, unspecified: Secondary | ICD-10-CM | POA: Diagnosis not present

## 2019-04-27 DIAGNOSIS — R262 Difficulty in walking, not elsewhere classified: Secondary | ICD-10-CM | POA: Diagnosis not present

## 2019-04-27 DIAGNOSIS — R339 Retention of urine, unspecified: Secondary | ICD-10-CM | POA: Diagnosis not present

## 2019-04-27 HISTORY — DX: Pressure ulcer of unspecified site, unspecified stage: L89.90

## 2019-04-27 LAB — BASIC METABOLIC PANEL
Anion gap: 18 — ABNORMAL HIGH (ref 5–15)
BUN: 16 mg/dL (ref 6–20)
CO2: 24 mmol/L (ref 22–32)
Calcium: 8.6 mg/dL — ABNORMAL LOW (ref 8.9–10.3)
Chloride: 94 mmol/L — ABNORMAL LOW (ref 98–111)
Creatinine, Ser: 3.62 mg/dL — ABNORMAL HIGH (ref 0.61–1.24)
GFR calc Af Amer: 22 mL/min — ABNORMAL LOW (ref >60)
GFR calc non Af Amer: 19 mL/min — ABNORMAL LOW (ref >60)
Glucose, Bld: 111 mg/dL — ABNORMAL HIGH (ref 70–99)
Potassium: 3.8 mmol/L (ref 3.5–5.1)
Sodium: 136 mmol/L (ref 135–145)

## 2019-04-27 LAB — NOVEL CORONAVIRUS, NAA (HOSP ORDER, SEND-OUT TO REF LAB; TAT 18-24 HRS): SARS-CoV-2, NAA: NOT DETECTED

## 2019-04-27 IMAGING — CR DG ABDOMEN 1V
2 series · 2 of 2 positions shown · non-contrast
Comparison: None.

CLINICAL DATA: Abdominal pain

EXAM:
ABDOMEN - 1 VIEW

[abdomen kub (1 of 2)]
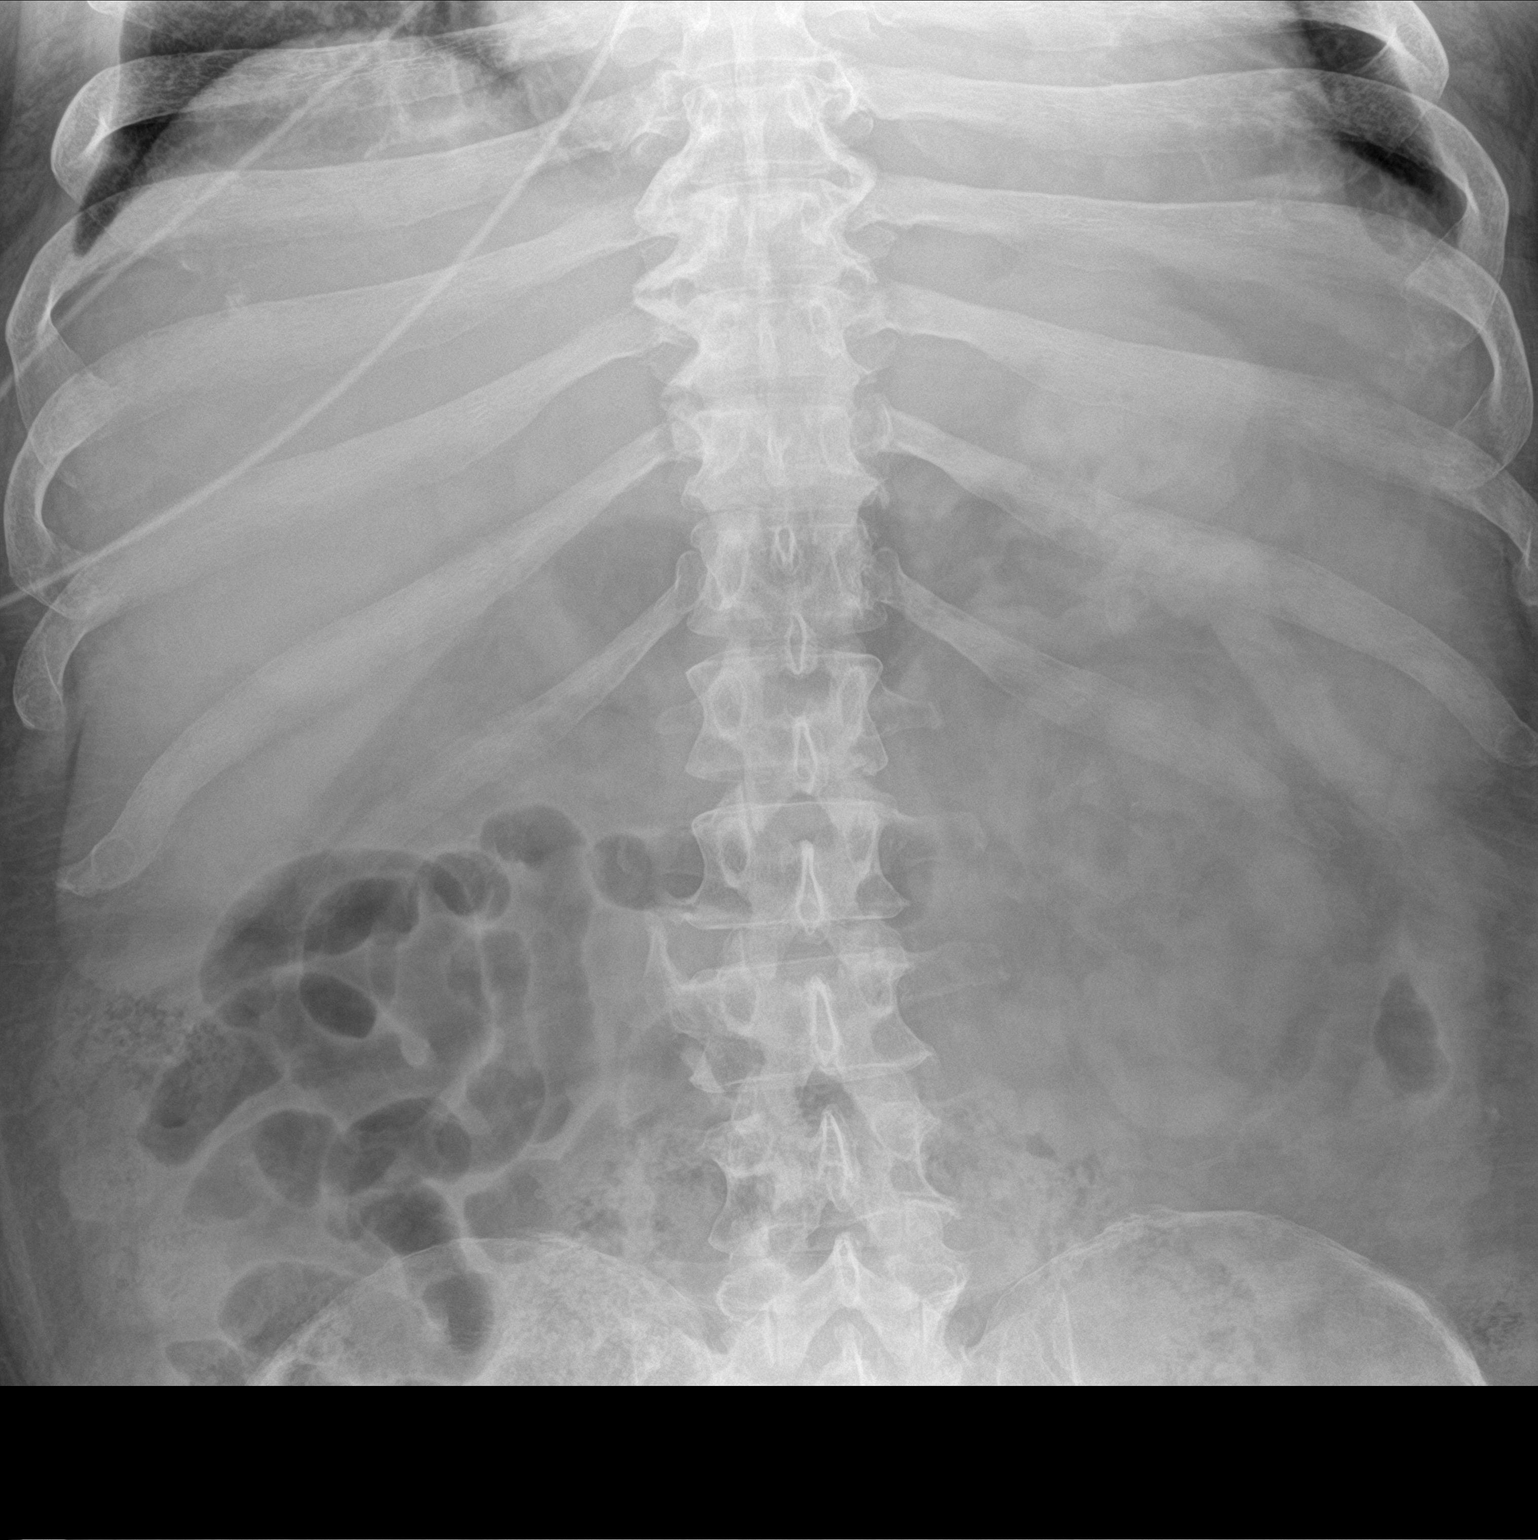

[abdomen kub (2 of 2)]
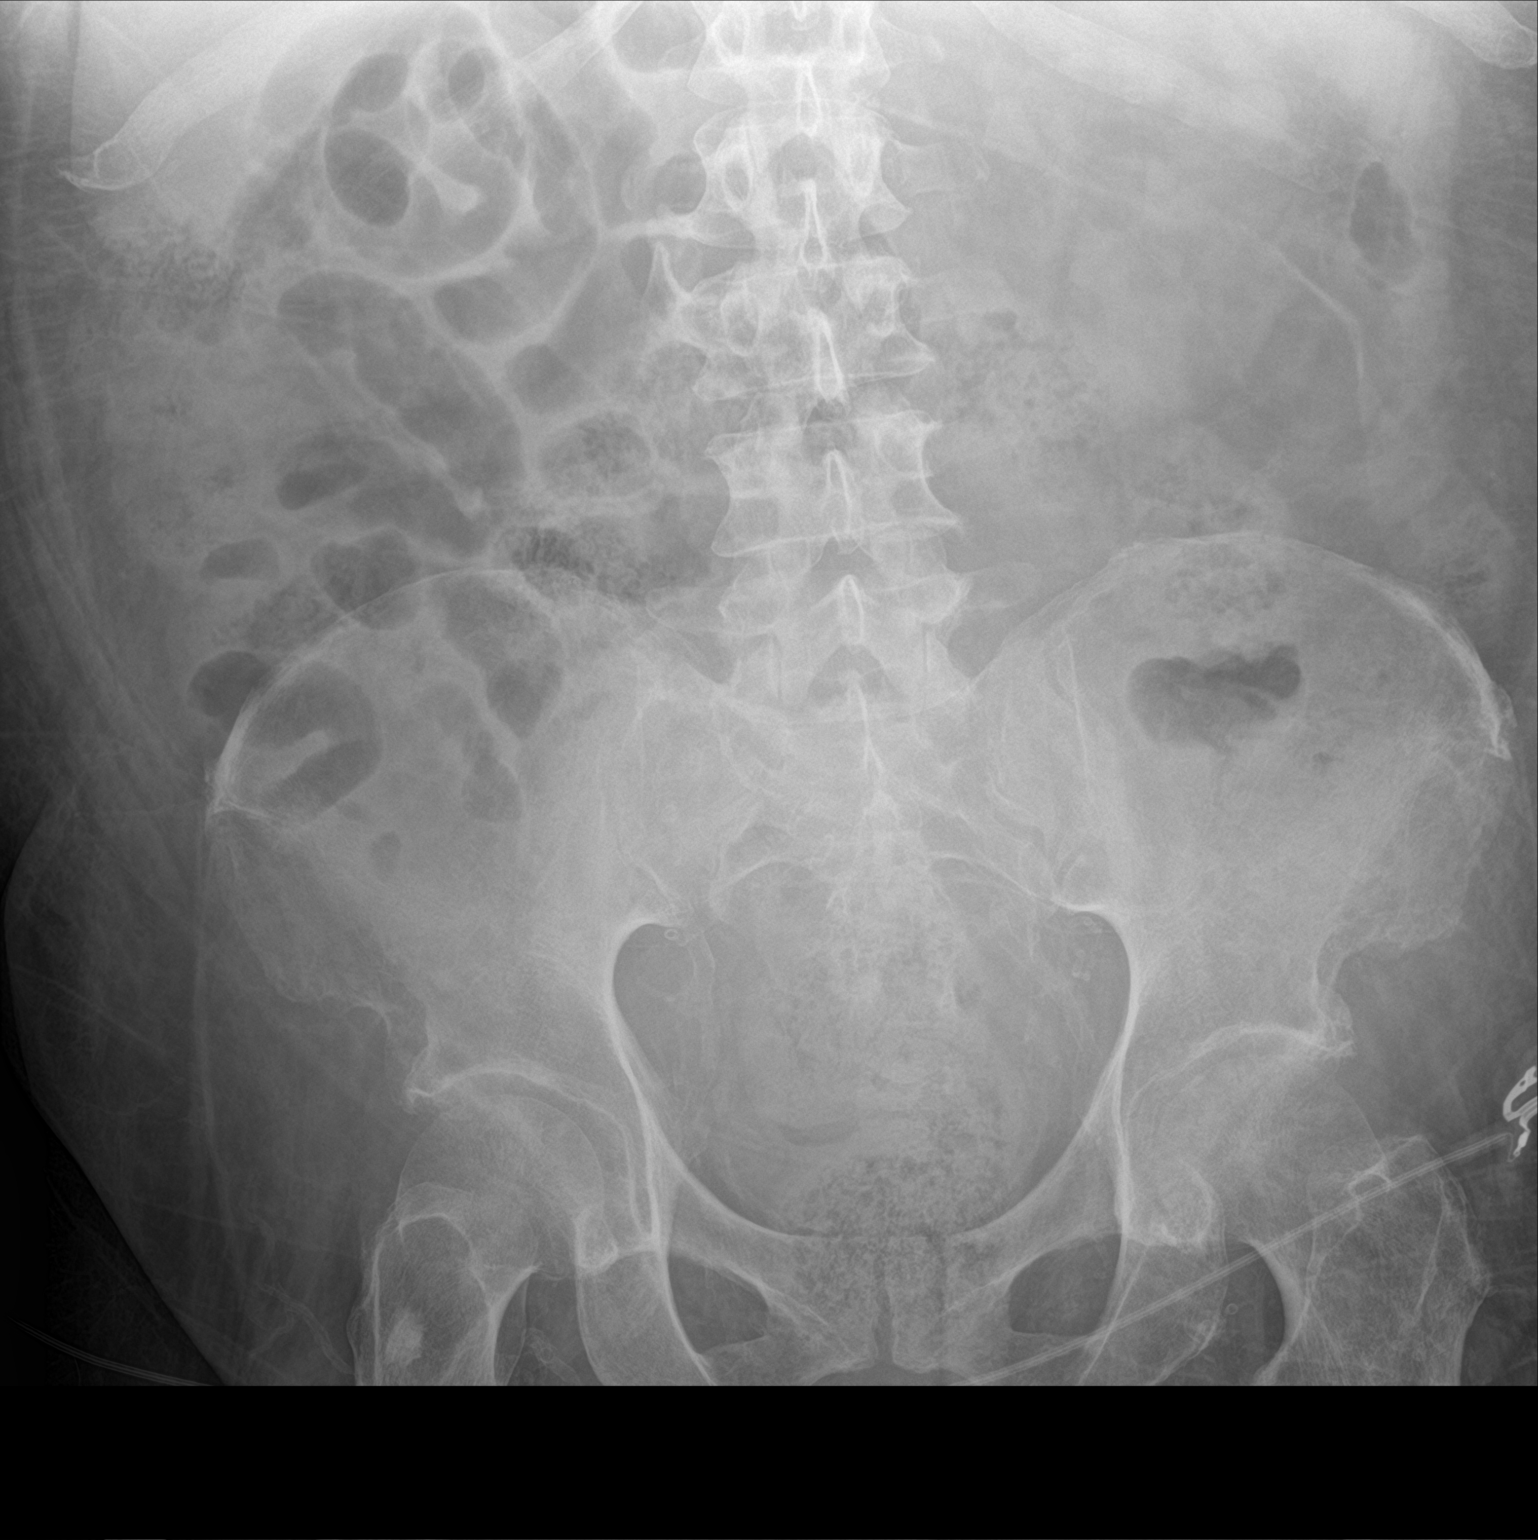

[2 of 2 positions shown; findings below may reference images not displayed]

FINDINGS: The bowel gas pattern is normal. No radio-opaque calculi or other
significant radiographic abnormality are seen. Sclerotic bone lesion
in the right proximal femur likely reflecting a bone island.
IMPRESSION: No acute abdominal abnormality.

## 2019-04-27 MED ORDER — MIDODRINE HCL 10 MG PO TABS: 10.0000 mg | ORAL_TABLET | ORAL | 0 refills | Status: AC

## 2019-04-27 MED ORDER — HYDROCORTISONE ACETATE 25 MG RE SUPP: 25.0000 mg | Freq: Two times a day (BID) | RECTAL | 0 refills | Status: DC | PRN

## 2019-04-27 MED ORDER — DULOXETINE HCL 40 MG PO CPEP: 40.0000 mg | ORAL_CAPSULE | Freq: Every day | ORAL | 0 refills | Status: DC

## 2019-04-27 MED ORDER — AMLODIPINE BESYLATE 5 MG PO TABS: 5.0000 mg | ORAL_TABLET | Freq: Every day | ORAL | 0 refills | Status: DC

## 2019-04-27 MED ORDER — OXYCODONE HCL 5 MG PO TABS: 5.0000 mg | ORAL_TABLET | Freq: Four times a day (QID) | ORAL | 0 refills | Status: DC | PRN

## 2019-04-27 MED ORDER — GERHARDT'S BUTT CREAM: 1.0000 "application " | TOPICAL_CREAM | Freq: Three times a day (TID) | CUTANEOUS | 0 refills | Status: DC | PRN

## 2019-04-27 MED ORDER — TRAMADOL HCL 50 MG PO TABS: 50.0000 mg | ORAL_TABLET | Freq: Four times a day (QID) | ORAL | 0 refills | Status: DC | PRN

## 2019-04-27 MED ORDER — CHOLESTYRAMINE 4 G PO PACK: 8.0000 g | PACK | Freq: Two times a day (BID) | ORAL | 0 refills | Status: DC

## 2019-04-27 NOTE — Plan of Care (Signed)
  Problem: Health Behavior/Discharge Planning: Goal: Ability to manage health-related needs will improve Outcome: Progressing   

## 2019-04-27 NOTE — Progress Notes (Signed)
DISCHARGE NOTE SN[facility] to be discharged Home per MD order. Patient verbalized understanding.  Skin clean, dry and intact without evidence of skin break down, no evidence of skin tears noted. IV catheter discontinued intact. Site without signs and symptoms of complications. Dressing and pressure applied. Pt denies pain at the site currently. No complaints noted.  Patient free of lines, drains, and wounds.   Discharge packet assembled. An After Visit Summary (AVS) was printed and given to the EMS personnel. Patient escorted via stretcher and discharged to [facility] via ambulance. Report called to accepting facility; all questions and concerns addressed.   Aneta Mins BSN, RN3

## 2019-04-27 NOTE — Plan of Care (Signed)
Problem: Education: Goal: Knowledge of General Education information will improve Description: Including pain rating scale, medication(s)/side effects and non-pharmacologic comfort measures Outcome: Adequate for Discharge   Problem: Health Behavior/Discharge Planning: Goal: Ability to manage health-related needs will improve 04/27/2019 1725 by Baldo Ash, RN Outcome: Adequate for Discharge 04/27/2019 0840 by Baldo Ash, RN Outcome: Progressing   Problem: Clinical Measurements: Goal: Ability to maintain clinical measurements within normal limits will improve Outcome: Adequate for Discharge Goal: Will remain free from infection Outcome: Adequate for Discharge Goal: Diagnostic test results will improve Outcome: Adequate for Discharge Goal: Respiratory complications will improve Outcome: Adequate for Discharge Goal: Cardiovascular complication will be avoided Outcome: Adequate for Discharge   Problem: Nutrition: Goal: Adequate nutrition will be maintained Outcome: Adequate for Discharge   Problem: Coping: Goal: Level of anxiety will decrease Outcome: Adequate for Discharge   Problem: Elimination: Goal: Will not experience complications related to bowel motility Outcome: Adequate for Discharge Goal: Will not experience complications related to urinary retention Outcome: Adequate for Discharge   Problem: Pain Managment: Goal: General experience of comfort will improve Outcome: Adequate for Discharge   Problem: Safety: Goal: Ability to remain free from injury will improve Outcome: Adequate for Discharge   Problem: Skin Integrity: Goal: Risk for impaired skin integrity will decrease Outcome: Adequate for Discharge   Problem: Education: Goal: Knowledge of General Education information will improve Description: Including pain rating scale, medication(s)/side effects and non-pharmacologic comfort measures Outcome: Adequate for Discharge   Problem: Health  Behavior/Discharge Planning: Goal: Ability to manage health-related needs will improve Outcome: Adequate for Discharge   Problem: Clinical Measurements: Goal: Ability to maintain clinical measurements within normal limits will improve Outcome: Adequate for Discharge Goal: Will remain free from infection Outcome: Adequate for Discharge Goal: Diagnostic test results will improve Outcome: Adequate for Discharge Goal: Respiratory complications will improve Outcome: Adequate for Discharge Goal: Cardiovascular complication will be avoided Outcome: Adequate for Discharge   Problem: Activity: Goal: Risk for activity intolerance will decrease Outcome: Adequate for Discharge   Problem: Nutrition: Goal: Adequate nutrition will be maintained Outcome: Adequate for Discharge   Problem: Coping: Goal: Level of anxiety will decrease Outcome: Adequate for Discharge   Problem: Elimination: Goal: Will not experience complications related to bowel motility Outcome: Adequate for Discharge Goal: Will not experience complications related to urinary retention Outcome: Adequate for Discharge   Problem: Pain Managment: Goal: General experience of comfort will improve Outcome: Adequate for Discharge   Problem: Safety: Goal: Ability to remain free from injury will improve Outcome: Adequate for Discharge   Problem: Skin Integrity: Goal: Risk for impaired skin integrity will decrease Outcome: Adequate for Discharge   Problem: Increased Nutrient Needs (NI-5.1) Goal: Food and/or nutrient delivery Description: Individualized approach for food/nutrient provision. Outcome: Adequate for Discharge   Problem: SLP Dysphagia Goals Goal: Misc Dysphagia Goal Description: Pt will tolerate regular texture diet with thin liquids without clinical s/s of aspiration or respiratory decline per RN/SLP observation. Outcome: Adequate for Discharge   Problem: Acute Rehab OT Goals (only OT should resolve) Goal:  Pt. Will Perform Grooming Outcome: Adequate for Discharge Goal: Pt. Will Transfer To Toilet Outcome: Adequate for Discharge Goal: Pt. Will Perform Toileting-Clothing Manipulation Outcome: Adequate for Discharge Goal: OT Additional ADL Goal #1 Outcome: Adequate for Discharge   Problem: Acute Rehab PT Goals(only PT should resolve) Goal: Pt will Roll Supine to Side Outcome: Adequate for Discharge Goal: Pt Will Go Supine/Side To Sit Outcome: Adequate for Discharge Goal: Pt Will Go Sit To  Supine/Side Outcome: Adequate for Discharge Goal: Patient Will Perform Sitting Balance Outcome: Adequate for Discharge Goal: Pt Will Transfer Bed To Chair/Chair To Bed Outcome: Adequate for Discharge

## 2019-04-27 NOTE — Progress Notes (Addendum)
  PROGRESS NOTE  Complained of some lower abdominal pain, KUB without acute abnormality.  Ready for SNF discharge today, awaiting repeat COVID results which should be available later today. Discharge summary updated to reflect today's dc date.    Dessa Phi, DO [facility] 04/27/2019, 1:13 PM  Available via Epic secure chat 7am-7pm After these hours, please refer to coverage provider listed on [email or web address]

## 2019-04-27 NOTE — TOC Transition Note (Addendum)
Transition of Care [facility]) - CM/SW Discharge Note   Patient Details  Name: Michael Riggs MRN: [id number] Date of Birth: 28-Jun-1971  [facility] CM/SW Contact:  Bartholomew Crews, RN Phone Number: [phone] 04/27/2019, 5:15 PM   Clinical Narrative:    Received notice that Covid results back - negative. Call to [facility] - he can return today. Faxed dc summary and covid results to [phone]. Nurse to call report at [phone]. Patient is going to room 207. PTAR notified for transport. MD notified. Spoke with patient at bedside about contacting family - he stated that he would call. No further transition of care needs.   Update: Verified with [facility] that fax was received.   Final next level of care: [place] Barriers to Discharge: No Barriers Identified   Patient Goals and CMS Choice Patient states their goals for this hospitalization and ongoing recovery are:: Pt is agreeable to return back to [facility] once he is medically stable. CMS Medicare.gov Compare Post Acute Care list provided to:: (Pt to return back to his SNF) Choice offered to / list presented to : NA  Discharge Placement              Patient chooses bed at: [facility] Patient to be transferred to facility by: [place] Name of family member notified: (Patient stated that he would call his family) Patient and family notified of of transfer: 04/27/19  Discharge Plan and Services In-house Referral: Clinical Social Work Discharge Planning Services: NA Post Acute Care Choice: [place]          DME Arranged: N/A DME Agency: NA       HH Arranged: NA HH Agency: NA        Social Determinants of Health (SDOH) Interventions     Readmission Risk Interventions No flowsheet data found.

## 2019-04-27 NOTE — Progress Notes (Signed)
[facility] Progress Note   Subjective: Planning on discharge today. C/O lower abdominal pain but says he has been constipated, refuses miralax.   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 Multiple wounds with eschar but look better than when last seen 2 weeks ago. Dialysis Access:RIJ TDC Drsg CDI  L AVF + bruit.   Additional Objective Labs: Basic Metabolic Panel: Recent Labs  Lab 04/26/19 0232 04/26/19 0951 04/27/19 0428  NA 132* 133* 136  K 4.1 4.1 3.8  CL 95* 95* 94*  CO2 22 19* 24  GLUCOSE 79 90 111*  BUN 22* 24* 16  CREATININE 4.59* 4.71* 3.62*  CALCIUM 8.9 8.9 8.6*  PHOS  --  4.1  --    Liver Function Tests: Recent Labs  Lab 04/26/19 0951  ALBUMIN 2.0*   No results for input(s): LIPASE, AMYLASE in the last 168 hours. CBC: Recent Labs  Lab 04/21/19 1605 04/26/19 0232  WBC 11.4* 8.3  NEUTROABS  --  4.4  HGB 9.1* 9.7*  HCT 30.8* 31.3*  MCV 93.3 92.9  PLT 394 387   Blood Culture    Component Value Date/Time   SDES TRACHEAL ASPIRATE 04/11/2019 1020   SPECREQUEST NONE 04/11/2019 1020   CULT  04/11/2019 1020    RARE Consistent with normal respiratory flora. Performed at [facility], [place] [address], [place], [place] [address]    REPTSTATUS 04/14/2019 FINAL 04/11/2019 1020    Cardiac Enzymes: No results for input(s): CKTOTAL, CKMB, CKMBINDEX, TROPONINI in the last 168 hours. CBG: No results for input(s): GLUCAP in the last 168 hours. Iron Studies: No results for input(s): IRON, TIBC, TRANSFERRIN,  FERRITIN in the last 72 hours. @lablastinr3 @ Studies/Results: No results found. Medications: . sodium thiosulfate infusion for calciphylaxis Stopped (04/26/19 1322)   . amLODipine  5 mg Oral QHS  . atorvastatin  40 mg Oral Q2000  . Chlorhexidine Gluconate Cloth  6 each Topical Q0600  . Chlorhexidine Gluconate Cloth  6 each Topical Q0600  . cholestyramine  8 g Oral BID  . darbepoetin (ARANESP) injection - DIALYSIS  150 mcg Intravenous Q Wed-HD  . DULoxetine  40 mg Oral Daily  . feeding supplement (NEPRO CARB STEADY)  237 mL Oral BID BM  . feeding supplement (PRO-STAT SUGAR FREE 64)  30 mL Oral BID  . ferric citrate  420 mg Oral TID WC  . heparin injection (subcutaneous)  5,000 Units Subcutaneous Q8H  . mouth rinse  15 mL Mouth Rinse BID  . midodrine  10 mg Oral Q M,W,F  . multivitamin  1 tablet Oral QHS  . pantoprazole  40 mg Oral Daily  . saccharomyces boulardii  250 mg Oral BID     OPDialysis Orders: [place] MWF 4 hrs 180NRe 400/800 120 kg 2.0 K/ 2.25 Ca -Heparin 4000 units IV TIW -Calcitriol 0.25 mcg PO TIW  Problem/Plan: 1.Shock/Cardiac arrest:Required intubation/pressor support. Clinically improved. Off pressors. Received antibiotics 9/8-9/23. Per primary.  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 Lesions do not appear to be worsening. Continue low calcium bath, avoid VDRA. Continue sodium thiosulfate, wound care. 3.ESRD:normalMWF HD.But MWF Sat 2/2 Calciphylaxis/ k 3.7 ,Was receiving serial HD on admission d/t calciphylaxis. Haveadded4th HD on Sat as outpatient.. Plan to continue 4 days/week dialysis as outpatient to improve healing of calciphylaxis wounds.Needs Recliner HD Before dc. Next HD 04/26/2019. 4. Acute respiratory failure with hypoxia. Extubated 9/22. Remains on supp oxygen. CPAP at night.  5.HTN/volume:Hypertensive today. BP 165/93- 162/95. Increase amlodipine to 10 mg PO q HS  HD  10/07 Net UF 2.5 New EDW 112 kg on DC.  6. Anemia:Hemoglobin ^9.7 Continue aranesp 150 mcg q Wednesday. 7. Secondary hyperparathyroidism:Avoid calcium/vit D in setting of calciphylaxis Continue auryxia.No recent RFP-added today.  8.Dialysis Access: Patient was scheduled for elective second stage basilic vein transposition on 9/21 which was postponed because of cardiac arrest.  9. GOC - Seen by palliative care 9/26 - Remains full code, full scope of care.  Disposition: [facility], Clipped to [employer], MWFS. Continue Sodium Thiosulfate post HD.   Kattia Selley H. Clare Casto NP-C 04/27/2019, 9:53 AM  [employer] [phone]

## 2019-04-27 NOTE — Progress Notes (Signed)
Changed patients dressings on bilateral legs. Xeroform ad guaze were applied. On patients sacrum a thick layer of cream was applied. Patient tolerated well.   Farley Ly RN

## 2019-04-27 NOTE — Progress Notes (Signed)
Renal Navigator notes transfer of units and updated CSW and Charge RN on 60M regarding plan for discharge today pending COVID result. Patient is cleared for discharge from an OP HD standpoint.  Alphonzo Cruise, [place] Renal Navigator [phone]

## 2019-04-28 DIAGNOSIS — N186 End stage renal disease: Secondary | ICD-10-CM | POA: Diagnosis not present

## 2019-04-28 DIAGNOSIS — D631 Anemia in chronic kidney disease: Secondary | ICD-10-CM | POA: Diagnosis not present

## 2019-04-28 DIAGNOSIS — D689 Coagulation defect, unspecified: Secondary | ICD-10-CM | POA: Diagnosis not present

## 2019-04-28 DIAGNOSIS — N2581 Secondary hyperparathyroidism of renal origin: Secondary | ICD-10-CM | POA: Diagnosis not present

## 2019-04-28 DIAGNOSIS — Z992 Dependence on renal dialysis: Secondary | ICD-10-CM | POA: Diagnosis not present

## 2019-04-28 DIAGNOSIS — D509 Iron deficiency anemia, unspecified: Secondary | ICD-10-CM | POA: Diagnosis not present

## 2019-04-28 DIAGNOSIS — E875 Hyperkalemia: Secondary | ICD-10-CM | POA: Diagnosis not present

## 2019-04-28 DIAGNOSIS — E1121 Type 2 diabetes mellitus with diabetic nephropathy: Secondary | ICD-10-CM | POA: Diagnosis not present

## 2019-05-01 DIAGNOSIS — E785 Hyperlipidemia, unspecified: Secondary | ICD-10-CM | POA: Diagnosis not present

## 2019-05-01 DIAGNOSIS — N186 End stage renal disease: Secondary | ICD-10-CM | POA: Diagnosis not present

## 2019-05-01 DIAGNOSIS — R339 Retention of urine, unspecified: Secondary | ICD-10-CM | POA: Diagnosis not present

## 2019-05-01 DIAGNOSIS — K219 Gastro-esophageal reflux disease without esophagitis: Secondary | ICD-10-CM | POA: Diagnosis not present

## 2019-05-03 DIAGNOSIS — D689 Coagulation defect, unspecified: Secondary | ICD-10-CM | POA: Diagnosis not present

## 2019-05-03 DIAGNOSIS — N186 End stage renal disease: Secondary | ICD-10-CM | POA: Diagnosis not present

## 2019-05-03 DIAGNOSIS — E875 Hyperkalemia: Secondary | ICD-10-CM | POA: Diagnosis not present

## 2019-05-03 DIAGNOSIS — Z992 Dependence on renal dialysis: Secondary | ICD-10-CM | POA: Diagnosis not present

## 2019-05-03 DIAGNOSIS — N2581 Secondary hyperparathyroidism of renal origin: Secondary | ICD-10-CM | POA: Diagnosis not present

## 2019-05-03 DIAGNOSIS — D631 Anemia in chronic kidney disease: Secondary | ICD-10-CM | POA: Diagnosis not present

## 2019-05-03 DIAGNOSIS — E1121 Type 2 diabetes mellitus with diabetic nephropathy: Secondary | ICD-10-CM | POA: Diagnosis not present

## 2019-05-03 DIAGNOSIS — D509 Iron deficiency anemia, unspecified: Secondary | ICD-10-CM | POA: Diagnosis not present

## 2019-05-05 ENCOUNTER — Other Ambulatory Visit: Payer: Self-pay | Admitting: *Deleted

## 2019-05-05 DIAGNOSIS — N2581 Secondary hyperparathyroidism of renal origin: Secondary | ICD-10-CM | POA: Diagnosis not present

## 2019-05-05 DIAGNOSIS — D631 Anemia in chronic kidney disease: Secondary | ICD-10-CM | POA: Diagnosis not present

## 2019-05-05 DIAGNOSIS — D509 Iron deficiency anemia, unspecified: Secondary | ICD-10-CM | POA: Diagnosis not present

## 2019-05-05 DIAGNOSIS — D689 Coagulation defect, unspecified: Secondary | ICD-10-CM | POA: Diagnosis not present

## 2019-05-05 DIAGNOSIS — Z992 Dependence on renal dialysis: Secondary | ICD-10-CM | POA: Diagnosis not present

## 2019-05-05 DIAGNOSIS — E1121 Type 2 diabetes mellitus with diabetic nephropathy: Secondary | ICD-10-CM | POA: Diagnosis not present

## 2019-05-05 DIAGNOSIS — E875 Hyperkalemia: Secondary | ICD-10-CM | POA: Diagnosis not present

## 2019-05-05 DIAGNOSIS — N186 End stage renal disease: Secondary | ICD-10-CM | POA: Diagnosis not present

## 2019-05-08 DIAGNOSIS — E1121 Type 2 diabetes mellitus with diabetic nephropathy: Secondary | ICD-10-CM | POA: Diagnosis not present

## 2019-05-08 DIAGNOSIS — D631 Anemia in chronic kidney disease: Secondary | ICD-10-CM | POA: Diagnosis not present

## 2019-05-08 DIAGNOSIS — Z992 Dependence on renal dialysis: Secondary | ICD-10-CM | POA: Diagnosis not present

## 2019-05-08 DIAGNOSIS — N186 End stage renal disease: Secondary | ICD-10-CM | POA: Diagnosis not present

## 2019-05-08 DIAGNOSIS — E875 Hyperkalemia: Secondary | ICD-10-CM | POA: Diagnosis not present

## 2019-05-08 DIAGNOSIS — N2581 Secondary hyperparathyroidism of renal origin: Secondary | ICD-10-CM | POA: Diagnosis not present

## 2019-05-08 DIAGNOSIS — D689 Coagulation defect, unspecified: Secondary | ICD-10-CM | POA: Diagnosis not present

## 2019-05-08 DIAGNOSIS — D509 Iron deficiency anemia, unspecified: Secondary | ICD-10-CM | POA: Diagnosis not present

## 2019-05-09 ENCOUNTER — Other Ambulatory Visit: Payer: Self-pay

## 2019-05-09 ENCOUNTER — Encounter: Payer: Self-pay | Admitting: Family

## 2019-05-09 ENCOUNTER — Ambulatory Visit (INDEPENDENT_AMBULATORY_CARE_PROVIDER_SITE_OTHER): Payer: Medicare Other | Admitting: Family

## 2019-05-09 DIAGNOSIS — Z992 Dependence on renal dialysis: Secondary | ICD-10-CM

## 2019-05-09 DIAGNOSIS — N186 End stage renal disease: Secondary | ICD-10-CM

## 2019-05-09 DIAGNOSIS — E1151 Type 2 diabetes mellitus with diabetic peripheral angiopathy without gangrene: Secondary | ICD-10-CM | POA: Diagnosis not present

## 2019-05-09 MED ORDER — COLLAGENASE 250 UNIT/GM EX OINT: 1.0000 "application " | TOPICAL_OINTMENT | Freq: Every day | CUTANEOUS | 3 refills | Status: DC

## 2019-05-09 NOTE — Progress Notes (Addendum)
Office Visit Note   Patient: Michael Riggs           Date of Birth: 29-Feb-1971           MRN: [id number] Visit Date: 05/09/2019              Requested by: [name], MD [address] [place],  [place] [address] PCP: [name], MD  Chief Complaint  Patient presents with  . 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  Patient states that the black eschar is improving.  Assessment & Plan: Visit Diagnoses:  1. Calciphylaxis   2. Diabetes mellitus with peripheral vascular disease ([place])   3. ESRD on dialysis [name])     Plan: Silvadene dressing was applied today patient was given a prescription for Santyl for the Santyl ointment to be applied to the black eschar daily plus dry dressing and change daily.  Follow-Up Instructions: Return in about 3 weeks (around 05/30/2019).   Ortho Exam  Patient is alert, oriented, no adenopathy, well-dressed, normal affect, normal respiratory effort. Examination patient is ambulating in a wheelchair.  He has a stable left transtibial amputation right leg has a good dorsalis pedis pulse he has multiple massive black eschars on both legs from the calciphylaxis.  Around the edges there is good superficial epithelialization there is no cellulitis no drainage no signs of infection.  After informed consent a 10 blade knife was used to debride skin and soft tissue from multiple wounds.  Total surface area debrided was 100 cm on both lower extremities.  There is good healthy granulation tissue good superficial epithelialization around the wound edges.  Imaging: No results found. No images are attached to the encounter.  Labs: Lab Results  Component Value Date   HGBA1C 5.0 01/31/2019   ESRSEDRATE >140 (H) 03/28/2019   CRP 19.4 (H)  03/28/2019   REPTSTATUS 04/14/2019 FINAL 04/11/2019   GRAMSTAIN  04/11/2019    RARE WBC PRESENT,BOTH PMN AND MONONUCLEAR NO ORGANISMS SEEN    CULT  04/11/2019    RARE Consistent with normal respiratory flora. Performed at [facility], [place] [address], [place], [place] [address]    Doy Hutching ENTEROCOCCUS FAECIUM (A) 03/10/2019     Lab Results  Component Value Date   ALBUMIN 2.0 (L) 04/26/2019   ALBUMIN 1.7 (L) 04/19/2019   ALBUMIN 1.7 (L) 04/16/2019   PREALBUMIN 9.1 (L) 03/28/2019    Lab Results  Component Value Date   MG 1.8 04/12/2019   MG 1.8 04/11/2019   MG 1.7 04/10/2019   No results found for: [facility]  Lab Results  Component Value Date   PREALBUMIN 9.1 (L) 03/28/2019   CBC EXTENDED Latest Ref Rng & Units 04/26/2019 04/21/2019 04/20/2019  WBC 4.0 - 10.5 K/uL 8.3 11.4(H) 10.2  RBC 4.22 - 5.81 MIL/uL 3.37(L) 3.30(L) 3.08(L)  HGB 13.0 - 17.0 g/dL 9.7(L) 9.1(L) 8.8(L)  HCT 39.0 - 52.0 % 31.3(L) 30.8(L) 28.7(L)  PLT 150 - 400 K/uL 387 394 342  NEUTROABS 1.7 - 7.7 K/uL 4.4 - 6.5  LYMPHSABS 0.7 - 4.0 K/uL 2.4 - 1.9     Body mass index is 35.43 kg/m.  Orders:  No orders of the defined types were placed in this encounter.  Meds ordered this encounter  Medications  .  collagenase (SANTYL) ointment    Sig: Apply 1 application topically daily. Apply to the affected area daily plus dry dressing    Dispense:  90 g    Refill:  3     Procedures: No procedures performed  Clinical Data: No additional findings.  ROS:  All other systems negative, except as noted in the HPI. Review of Systems  Objective: Vital Signs: Ht 5\' 10"  (1.778 m)   Wt 246 lb 14.7 oz (112 kg)   BMI 35.43 kg/m   Specialty Comments:  No specialty comments available.  PMFS History: Patient Active Problem List   Diagnosis Date Noted  . Pressure injury of skin 04/27/2019  . Palliative care encounter   . Goals of care, counseling/discussion   . Calciphylaxis   . Wound infection  03/28/2019  . Hypertension   . Dyslipidemia   . Diabetes mellitus with peripheral vascular disease ([place])   . Depression   . Physical deconditioning   . Pruritus   . Scrotal pain   . Labile blood pressure   . Scrotal edema   . Status post below-knee amputation of left lower extremity ([place])   . Slow transit constipation   . Leukocytosis   . Sleep disturbance   . Essential hypertension   . Anemia of chronic disease   . Controlled type 2 diabetes mellitus with hyperglycemia, without long-term current use of insulin ([place])   . ESRD on dialysis ([place])   . Urinary retention   . Debility 02/09/2019  . Fluid overload 02/04/2019  . Hyperkalemia 01/30/2019  . ARF (acute renal failure) ([place]) 01/30/2019  . Hypokalemia 01/30/2019  . Hypoglycemia 01/30/2019  . Syphilis 01/30/2019  . Macrocytic anemia 01/30/2019  . End stage renal disease ([place])   . ESRD (end stage renal disease) on dialysis ([place]) 01/2019  . PDR (proliferative diabetic retinopathy) ([place]) 12/14/2013  . Venous hypertension 09/22/2013  . Diabetes mellitus type 2 in obese ([place]) 07/04/2013  . Habitual alcohol use 07/04/2013  . Obesity, Class II, BMI 35-39.9, with comorbidity 07/04/2013   Past Medical History:  Diagnosis Date  . Anemia   . Depression   . Diabetes mellitus with peripheral vascular disease ([place])   . Dyslipidemia   . Dyspnea   . ESRD (end stage renal disease) on dialysis ([place]) 01/2019   MWF  . GERD (gastroesophageal reflux disease)   . Hypertension   . Physical deconditioning   . Syphilis 01/2019    History reviewed. No pertinent family history.  Past Surgical History:  Procedure Laterality Date  . AV FISTULA PLACEMENT Left 01/09/2019   Procedure: ARTERIOVENOUS (AV) FISTULA CREATION LEFT UPPER ARM;  Surgeon: [name], MD;  Location: [facility] OR;  Service: Vascular;  Laterality: Left;  . below the knee amputation Left   . EYE SURGERY    . IR FLUORO GUIDE CV LINE RIGHT  01/31/2019  . IR FLUORO GUIDE CV LINE  RIGHT  02/03/2019  . IR US GUIDE VASC ACCESS RIGHT  01/31/2019  . IR US GUIDE VASC ACCESS RIGHT  02/03/2019   Social History   Occupational History  . Not on file  Tobacco Use  . Smoking status: Former Smoker    Types: Cigarettes    Start date: 11/18/2018    Quit date: 01/18/2019    Years since quitting: 0.3  . Smokeless tobacco: Never Used  . Tobacco comment: pt stated got bored with it  Substance and Sexual Activity  . Alcohol use: Not Currently    Comment: heavy  drinker in the past, none since 07/19/18  . Drug use: Not Currently  . Sexual activity: Not Currently

## 2019-05-11 DIAGNOSIS — G8929 Other chronic pain: Secondary | ICD-10-CM | POA: Diagnosis not present

## 2019-05-11 DIAGNOSIS — K219 Gastro-esophageal reflux disease without esophagitis: Secondary | ICD-10-CM | POA: Diagnosis not present

## 2019-05-11 DIAGNOSIS — N186 End stage renal disease: Secondary | ICD-10-CM | POA: Diagnosis not present

## 2019-05-11 DIAGNOSIS — R339 Retention of urine, unspecified: Secondary | ICD-10-CM | POA: Diagnosis not present

## 2019-05-12 DIAGNOSIS — D509 Iron deficiency anemia, unspecified: Secondary | ICD-10-CM | POA: Diagnosis not present

## 2019-05-12 DIAGNOSIS — D689 Coagulation defect, unspecified: Secondary | ICD-10-CM | POA: Diagnosis not present

## 2019-05-12 DIAGNOSIS — D631 Anemia in chronic kidney disease: Secondary | ICD-10-CM | POA: Diagnosis not present

## 2019-05-12 DIAGNOSIS — E875 Hyperkalemia: Secondary | ICD-10-CM | POA: Diagnosis not present

## 2019-05-12 DIAGNOSIS — N186 End stage renal disease: Secondary | ICD-10-CM | POA: Diagnosis not present

## 2019-05-12 DIAGNOSIS — E1121 Type 2 diabetes mellitus with diabetic nephropathy: Secondary | ICD-10-CM | POA: Diagnosis not present

## 2019-05-12 DIAGNOSIS — N2581 Secondary hyperparathyroidism of renal origin: Secondary | ICD-10-CM | POA: Diagnosis not present

## 2019-05-12 DIAGNOSIS — Z992 Dependence on renal dialysis: Secondary | ICD-10-CM | POA: Diagnosis not present

## 2019-05-14 ENCOUNTER — Emergency Department (HOSPITAL_COMMUNITY): Payer: Medicare Other

## 2019-05-14 ENCOUNTER — Inpatient Hospital Stay (HOSPITAL_COMMUNITY)
Admission: EM | Admit: 2019-05-14 | Discharge: 2019-05-24 | DRG: 393 | Disposition: A | Discharge status: Skilled Nursing Facility | Payer: Medicare Other | Source: Other Acute Inpatient Hospital | Attending: Internal Medicine | Admitting: Internal Medicine

## 2019-05-14 ENCOUNTER — Other Ambulatory Visit: Payer: Self-pay

## 2019-05-14 ENCOUNTER — Encounter (HOSPITAL_COMMUNITY): Payer: Self-pay | Admitting: Emergency Medicine

## 2019-05-14 DIAGNOSIS — N186 End stage renal disease: Secondary | ICD-10-CM | POA: Diagnosis present

## 2019-05-14 DIAGNOSIS — K649 Unspecified hemorrhoids: Secondary | ICD-10-CM | POA: Diagnosis present

## 2019-05-14 DIAGNOSIS — E1169 Type 2 diabetes mellitus with other specified complication: Secondary | ICD-10-CM | POA: Diagnosis not present

## 2019-05-14 DIAGNOSIS — R0602 Shortness of breath: Secondary | ICD-10-CM | POA: Diagnosis not present

## 2019-05-14 DIAGNOSIS — I120 Hypertensive chronic kidney disease with stage 5 chronic kidney disease or end stage renal disease: Secondary | ICD-10-CM | POA: Diagnosis not present

## 2019-05-14 DIAGNOSIS — R58 Hemorrhage, not elsewhere classified: Secondary | ICD-10-CM | POA: Diagnosis not present

## 2019-05-14 DIAGNOSIS — E1151 Type 2 diabetes mellitus with diabetic peripheral angiopathy without gangrene: Secondary | ICD-10-CM | POA: Diagnosis not present

## 2019-05-14 DIAGNOSIS — R61 Generalized hyperhidrosis: Secondary | ICD-10-CM | POA: Diagnosis not present

## 2019-05-14 DIAGNOSIS — M6281 Muscle weakness (generalized): Secondary | ICD-10-CM | POA: Diagnosis not present

## 2019-05-14 DIAGNOSIS — E1165 Type 2 diabetes mellitus with hyperglycemia: Secondary | ICD-10-CM | POA: Diagnosis present

## 2019-05-14 DIAGNOSIS — K219 Gastro-esophageal reflux disease without esophagitis: Secondary | ICD-10-CM | POA: Diagnosis present

## 2019-05-14 DIAGNOSIS — R29898 Other symptoms and signs involving the musculoskeletal system: Secondary | ICD-10-CM | POA: Diagnosis not present

## 2019-05-14 DIAGNOSIS — E669 Obesity, unspecified: Secondary | ICD-10-CM | POA: Diagnosis present

## 2019-05-14 DIAGNOSIS — Z20828 Contact with and (suspected) exposure to other viral communicable diseases: Secondary | ICD-10-CM | POA: Diagnosis present

## 2019-05-14 DIAGNOSIS — K922 Gastrointestinal hemorrhage, unspecified: Secondary | ICD-10-CM

## 2019-05-14 DIAGNOSIS — I9589 Other hypotension: Secondary | ICD-10-CM | POA: Diagnosis present

## 2019-05-14 DIAGNOSIS — Z992 Dependence on renal dialysis: Secondary | ICD-10-CM | POA: Diagnosis not present

## 2019-05-14 DIAGNOSIS — E1122 Type 2 diabetes mellitus with diabetic chronic kidney disease: Secondary | ICD-10-CM | POA: Diagnosis not present

## 2019-05-14 DIAGNOSIS — Z743 Need for continuous supervision: Secondary | ICD-10-CM | POA: Diagnosis not present

## 2019-05-14 DIAGNOSIS — R102 Pelvic and perineal pain: Secondary | ICD-10-CM

## 2019-05-14 DIAGNOSIS — E1121 Type 2 diabetes mellitus with diabetic nephropathy: Secondary | ICD-10-CM | POA: Diagnosis not present

## 2019-05-14 DIAGNOSIS — R935 Abnormal findings on diagnostic imaging of other abdominal regions, including retroperitoneum: Secondary | ICD-10-CM

## 2019-05-14 DIAGNOSIS — K921 Melena: Secondary | ICD-10-CM | POA: Diagnosis not present

## 2019-05-14 DIAGNOSIS — D509 Iron deficiency anemia, unspecified: Secondary | ICD-10-CM | POA: Diagnosis not present

## 2019-05-14 DIAGNOSIS — R5381 Other malaise: Secondary | ICD-10-CM | POA: Diagnosis not present

## 2019-05-14 DIAGNOSIS — K5900 Constipation, unspecified: Secondary | ICD-10-CM | POA: Diagnosis not present

## 2019-05-14 DIAGNOSIS — Z03818 Encounter for observation for suspected exposure to other biological agents ruled out: Secondary | ICD-10-CM | POA: Diagnosis not present

## 2019-05-14 DIAGNOSIS — F329 Major depressive disorder, single episode, unspecified: Secondary | ICD-10-CM | POA: Diagnosis present

## 2019-05-14 DIAGNOSIS — K529 Noninfective gastroenteritis and colitis, unspecified: Secondary | ICD-10-CM

## 2019-05-14 DIAGNOSIS — G4730 Sleep apnea, unspecified: Secondary | ICD-10-CM | POA: Diagnosis not present

## 2019-05-14 DIAGNOSIS — L97829 Non-pressure chronic ulcer of other part of left lower leg with unspecified severity: Secondary | ICD-10-CM | POA: Diagnosis present

## 2019-05-14 DIAGNOSIS — I10 Essential (primary) hypertension: Secondary | ICD-10-CM | POA: Diagnosis not present

## 2019-05-14 DIAGNOSIS — L8990 Pressure ulcer of unspecified site, unspecified stage: Secondary | ICD-10-CM | POA: Diagnosis present

## 2019-05-14 DIAGNOSIS — Z89512 Acquired absence of left leg below knee: Secondary | ICD-10-CM | POA: Diagnosis not present

## 2019-05-14 DIAGNOSIS — R339 Retention of urine, unspecified: Secondary | ICD-10-CM | POA: Diagnosis not present

## 2019-05-14 DIAGNOSIS — K6289 Other specified diseases of anus and rectum: Secondary | ICD-10-CM | POA: Diagnosis not present

## 2019-05-14 DIAGNOSIS — Z9115 Patient's noncompliance with renal dialysis: Secondary | ICD-10-CM

## 2019-05-14 DIAGNOSIS — D638 Anemia in other chronic diseases classified elsewhere: Secondary | ICD-10-CM | POA: Diagnosis not present

## 2019-05-14 DIAGNOSIS — Z79899 Other long term (current) drug therapy: Secondary | ICD-10-CM

## 2019-05-14 DIAGNOSIS — K626 Ulcer of anus and rectum: Principal | ICD-10-CM | POA: Diagnosis present

## 2019-05-14 DIAGNOSIS — I959 Hypotension, unspecified: Secondary | ICD-10-CM | POA: Diagnosis not present

## 2019-05-14 DIAGNOSIS — N2581 Secondary hyperparathyroidism of renal origin: Secondary | ICD-10-CM | POA: Diagnosis present

## 2019-05-14 DIAGNOSIS — Z515 Encounter for palliative care: Secondary | ICD-10-CM

## 2019-05-14 DIAGNOSIS — G4700 Insomnia, unspecified: Secondary | ICD-10-CM | POA: Diagnosis not present

## 2019-05-14 DIAGNOSIS — R531 Weakness: Secondary | ICD-10-CM | POA: Diagnosis not present

## 2019-05-14 DIAGNOSIS — R269 Unspecified abnormalities of gait and mobility: Secondary | ICD-10-CM | POA: Diagnosis not present

## 2019-05-14 DIAGNOSIS — G8929 Other chronic pain: Secondary | ICD-10-CM | POA: Diagnosis present

## 2019-05-14 DIAGNOSIS — R279 Unspecified lack of coordination: Secondary | ICD-10-CM | POA: Diagnosis not present

## 2019-05-14 DIAGNOSIS — D631 Anemia in chronic kidney disease: Secondary | ICD-10-CM | POA: Diagnosis not present

## 2019-05-14 DIAGNOSIS — K625 Hemorrhage of anus and rectum: Secondary | ICD-10-CM | POA: Diagnosis not present

## 2019-05-14 DIAGNOSIS — E876 Hypokalemia: Secondary | ICD-10-CM | POA: Diagnosis present

## 2019-05-14 DIAGNOSIS — E785 Hyperlipidemia, unspecified: Secondary | ICD-10-CM | POA: Diagnosis present

## 2019-05-14 DIAGNOSIS — K8020 Calculus of gallbladder without cholecystitis without obstruction: Secondary | ICD-10-CM | POA: Diagnosis not present

## 2019-05-14 DIAGNOSIS — Z23 Encounter for immunization: Secondary | ICD-10-CM | POA: Diagnosis not present

## 2019-05-14 DIAGNOSIS — Z87891 Personal history of nicotine dependence: Secondary | ICD-10-CM

## 2019-05-14 DIAGNOSIS — R278 Other lack of coordination: Secondary | ICD-10-CM | POA: Diagnosis not present

## 2019-05-14 DIAGNOSIS — R262 Difficulty in walking, not elsewhere classified: Secondary | ICD-10-CM | POA: Diagnosis not present

## 2019-05-14 DIAGNOSIS — L97819 Non-pressure chronic ulcer of other part of right lower leg with unspecified severity: Secondary | ICD-10-CM | POA: Diagnosis present

## 2019-05-14 DIAGNOSIS — D689 Coagulation defect, unspecified: Secondary | ICD-10-CM | POA: Diagnosis not present

## 2019-05-14 DIAGNOSIS — G4733 Obstructive sleep apnea (adult) (pediatric): Secondary | ICD-10-CM | POA: Diagnosis present

## 2019-05-14 DIAGNOSIS — E11622 Type 2 diabetes mellitus with other skin ulcer: Secondary | ICD-10-CM | POA: Diagnosis present

## 2019-05-14 DIAGNOSIS — E875 Hyperkalemia: Secondary | ICD-10-CM | POA: Diagnosis not present

## 2019-05-14 DIAGNOSIS — E11649 Type 2 diabetes mellitus with hypoglycemia without coma: Secondary | ICD-10-CM | POA: Diagnosis not present

## 2019-05-14 DIAGNOSIS — L299 Pruritus, unspecified: Secondary | ICD-10-CM | POA: Diagnosis not present

## 2019-05-14 DIAGNOSIS — D62 Acute posthemorrhagic anemia: Secondary | ICD-10-CM | POA: Diagnosis present

## 2019-05-14 DIAGNOSIS — Z741 Need for assistance with personal care: Secondary | ICD-10-CM | POA: Diagnosis not present

## 2019-05-14 DIAGNOSIS — L89152 Pressure ulcer of sacral region, stage 2: Secondary | ICD-10-CM | POA: Diagnosis not present

## 2019-05-14 HISTORY — DX: Gastrointestinal hemorrhage, unspecified: K92.2

## 2019-05-14 LAB — HEPATIC FUNCTION PANEL
ALT: 12 U/L (ref 0–44)
AST: 26 U/L (ref 15–41)
Albumin: 2 g/dL — ABNORMAL LOW (ref 3.5–5.0)
Alkaline Phosphatase: 72 U/L (ref 38–126)
Bilirubin, Direct: 0.3 mg/dL — ABNORMAL HIGH (ref 0.0–0.2)
Indirect Bilirubin: 0.8 mg/dL (ref 0.3–0.9)
Total Bilirubin: 1.1 mg/dL (ref 0.3–1.2)
Total Protein: 7.1 g/dL (ref 6.5–8.1)

## 2019-05-14 LAB — BASIC METABOLIC PANEL
Anion gap: 11 (ref 5–15)
BUN: 35 mg/dL — ABNORMAL HIGH (ref 6–20)
CO2: 24 mmol/L (ref 22–32)
Calcium: 8.3 mg/dL — ABNORMAL LOW (ref 8.9–10.3)
Chloride: 103 mmol/L (ref 98–111)
Creatinine, Ser: 6.04 mg/dL — ABNORMAL HIGH (ref 0.61–1.24)
GFR calc Af Amer: 12 mL/min — ABNORMAL LOW (ref >60)
GFR calc non Af Amer: 10 mL/min — ABNORMAL LOW (ref >60)
Glucose, Bld: 116 mg/dL — ABNORMAL HIGH (ref 70–99)
Potassium: 4.7 mmol/L (ref 3.5–5.1)
Sodium: 138 mmol/L (ref 135–145)

## 2019-05-14 LAB — LACTIC ACID, PLASMA
Lactic Acid, Venous: 2.1 mmol/L (ref 0.5–1.9)
Lactic Acid, Venous: 1.1 mmol/L (ref 0.5–1.9)

## 2019-05-14 LAB — CBC
HCT: 27.9 % — ABNORMAL LOW (ref 39.0–52.0)
Hemoglobin: 8.4 g/dL — ABNORMAL LOW (ref 13.0–17.0)
MCH: 27.7 pg (ref 26.0–34.0)
MCHC: 30.1 g/dL (ref 30.0–36.0)
MCV: 92.1 fL (ref 80.0–100.0)
Platelets: 351 10*3/uL (ref 150–400)
RBC: 3.03 MIL/uL — ABNORMAL LOW (ref 4.22–5.81)
RDW: 16.7 % — ABNORMAL HIGH (ref 11.5–15.5)
WBC: 13.3 10*3/uL — ABNORMAL HIGH (ref 4.0–10.5)
nRBC: 0 % (ref 0.0–0.2)

## 2019-05-14 LAB — PREPARE RBC (CROSSMATCH)

## 2019-05-14 LAB — CBC WITH DIFFERENTIAL/PLATELET
Abs Immature Granulocytes: 0.06 10*3/uL (ref 0.00–0.07)
Basophils Absolute: 0.1 10*3/uL (ref 0.0–0.1)
Basophils Relative: 1 %
Eosinophils Absolute: 0.1 10*3/uL (ref 0.0–0.5)
Eosinophils Relative: 1 %
HCT: 30.3 % — ABNORMAL LOW (ref 39.0–52.0)
Hemoglobin: 9.2 g/dL — ABNORMAL LOW (ref 13.0–17.0)
Immature Granulocytes: 1 %
Lymphocytes Relative: 11 %
Lymphs Abs: 1.3 10*3/uL (ref 0.7–4.0)
MCH: 28 pg (ref 26.0–34.0)
MCHC: 30.4 g/dL (ref 30.0–36.0)
MCV: 92.1 fL (ref 80.0–100.0)
Monocytes Absolute: 0.7 10*3/uL (ref 0.1–1.0)
Monocytes Relative: 6 %
Neutro Abs: 10 10*3/uL — ABNORMAL HIGH (ref 1.7–7.7)
Neutrophils Relative %: 80 %
Platelets: 317 10*3/uL (ref 150–400)
RBC: 3.29 MIL/uL — ABNORMAL LOW (ref 4.22–5.81)
RDW: 16.7 % — ABNORMAL HIGH (ref 11.5–15.5)
WBC: 12.4 10*3/uL — ABNORMAL HIGH (ref 4.0–10.5)
nRBC: 0 % (ref 0.0–0.2)

## 2019-05-14 LAB — POC OCCULT BLOOD, ED: Fecal Occult Bld: POSITIVE — AB

## 2019-05-14 LAB — SARS CORONAVIRUS 2 BY RT PCR (HOSPITAL ORDER, PERFORMED IN ~~LOC~~ HOSPITAL LAB): SARS Coronavirus 2: NEGATIVE

## 2019-05-14 LAB — LIPASE, BLOOD: Lipase: 40 U/L (ref 11–51)

## 2019-05-14 IMAGING — CT CT ABD-PELV W/O CM
2 of 5 series · 16 of 46 positions shown, 18 images · non-contrast
Comparison: [DATE] abdominal radiograph. [DATE] CT
abdomen/pelvis.

CLINICAL DATA: Rectal bleeding this morning. Constipation.
Abdominal distension.

EXAM:
CT ABDOMEN AND PELVIS WITHOUT CONTRAST
TECHNIQUE: Multidetector CT imaging of the abdomen and pelvis was performed
following the standard protocol without IV contrast.

[Series 6: cor st · coronal · 0.99mm/px · 3 of 121 slices shown]
[im 41/121  soft-tissue]
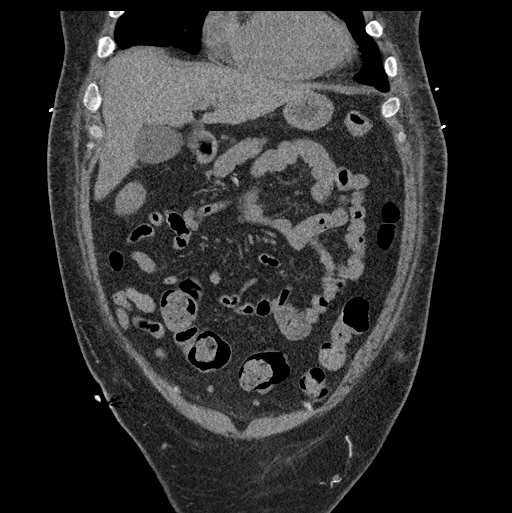
[im 54/121  soft-tissue]
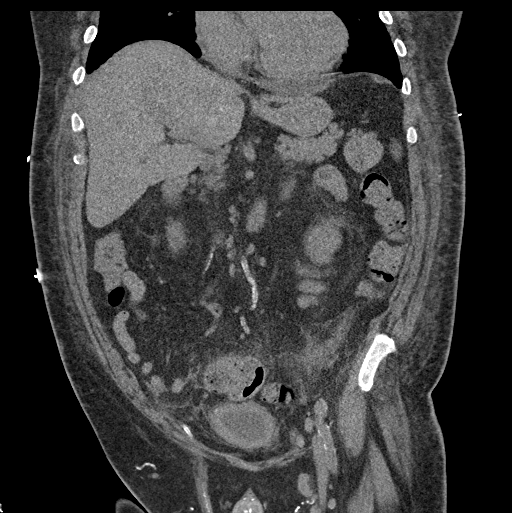
[im 67/121  soft-tissue]
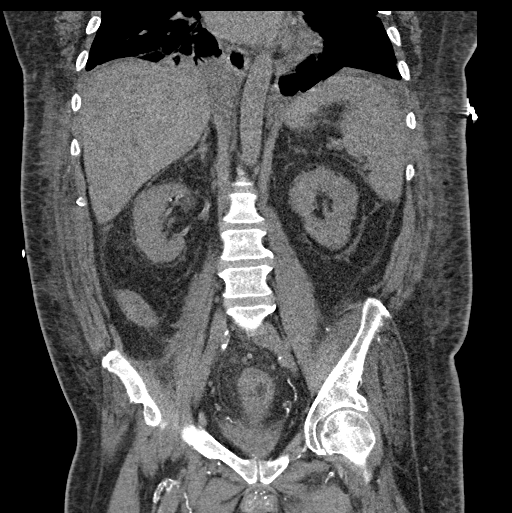

[Series 8: abd/ pelvis 5.0 i30f 2 · axial · 0.98mm/px · z∈[+720,+1175]mm · 13 of 101 slices shown, 15 images]
[im 5/101  soft-tissue]
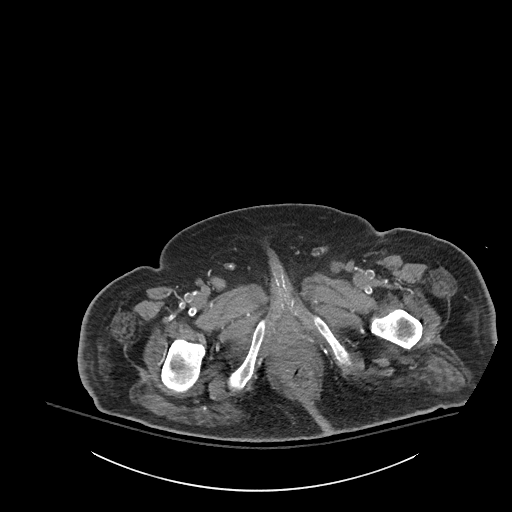
[im 5/101  bone]
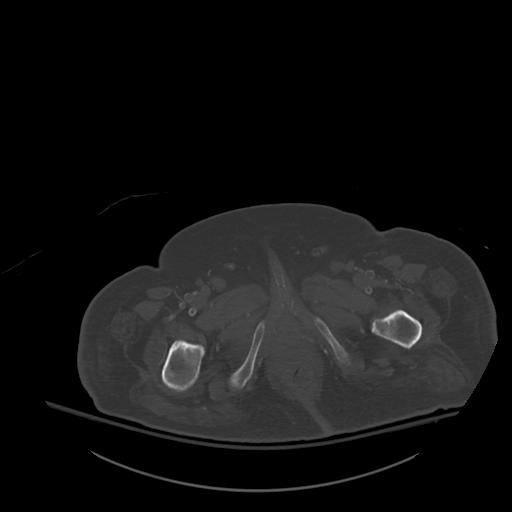
[im 14/101  soft-tissue]
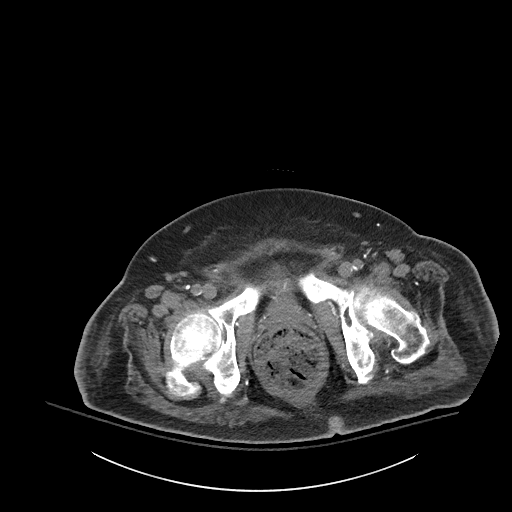
[im 23/101  soft-tissue]
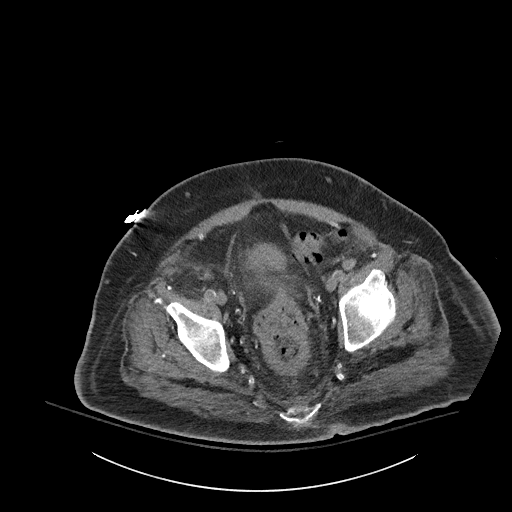
[im 28/101  soft-tissue]
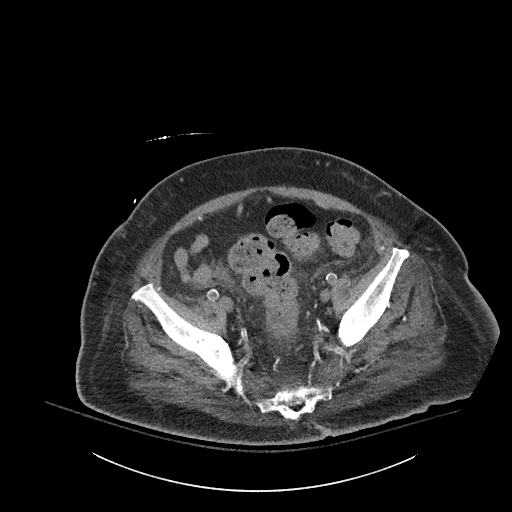
[im 37/101  soft-tissue]
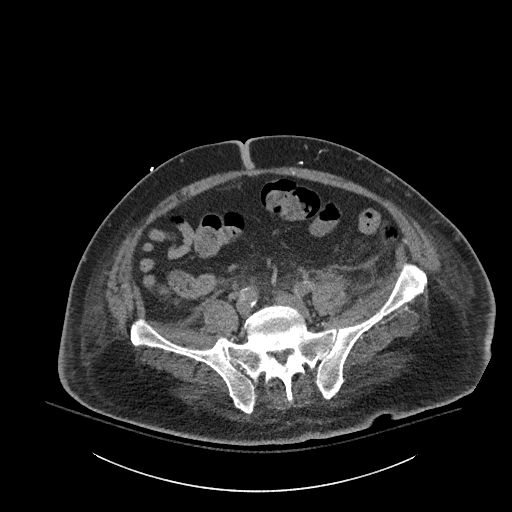
[im 41/101  soft-tissue]
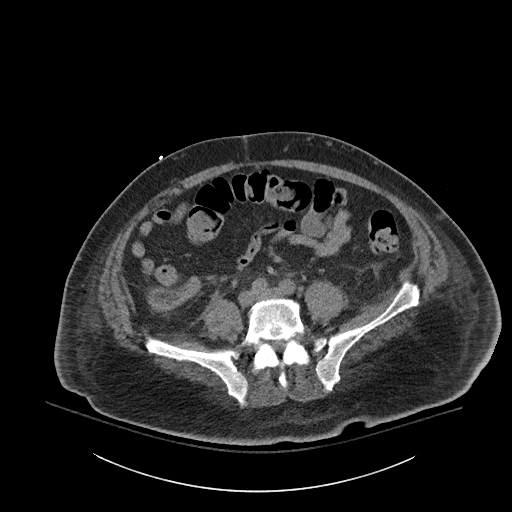
[im 51/101  soft-tissue]
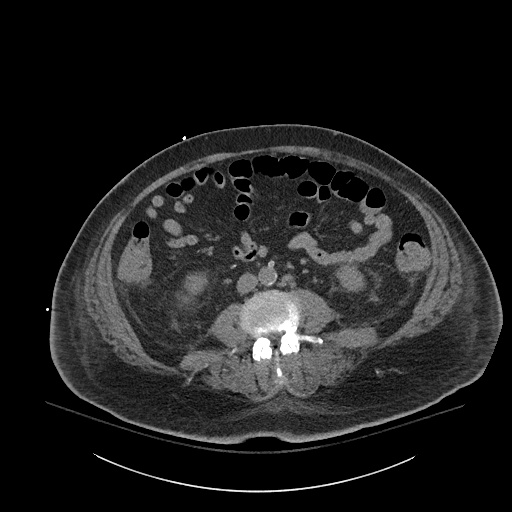
[im 60/101  soft-tissue]
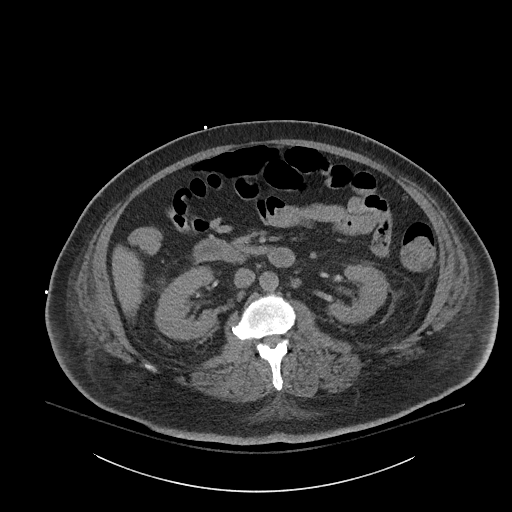
[im 64/101  soft-tissue]
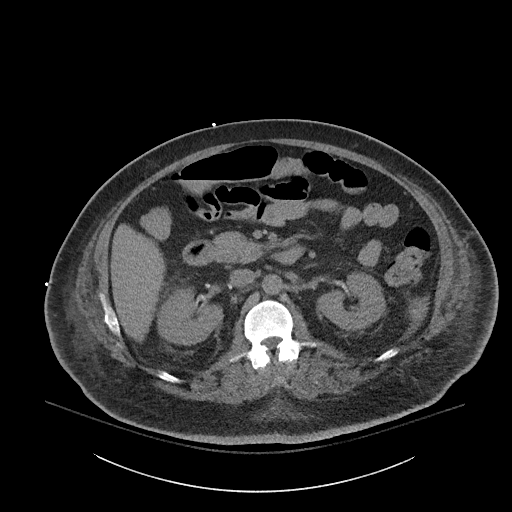
[im 64/101  bone]
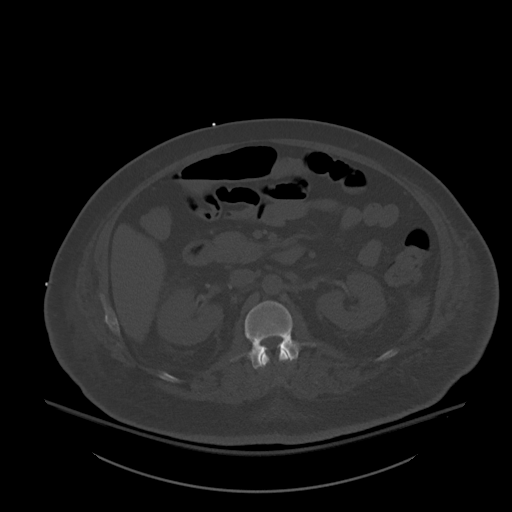
[im 73/101  soft-tissue]
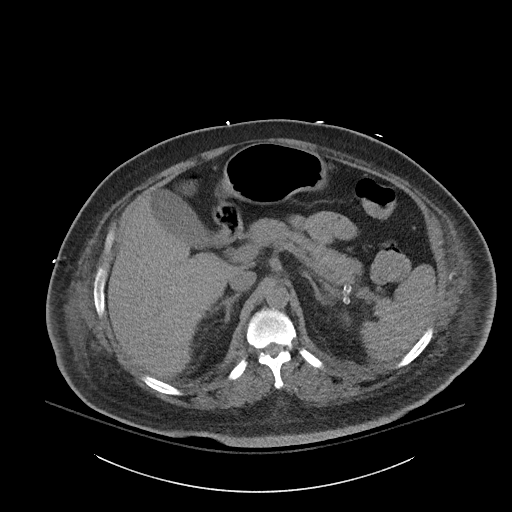
[im 78/101  soft-tissue]
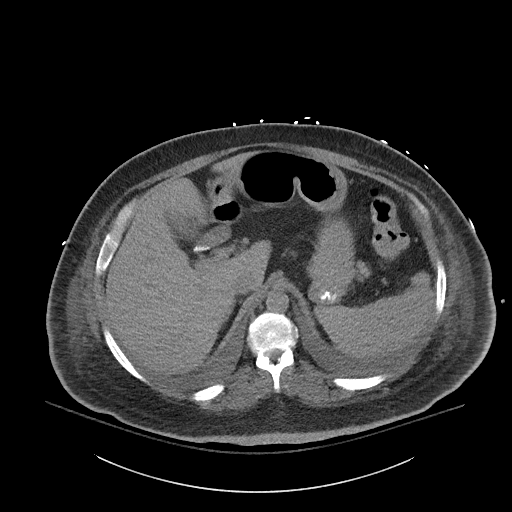
[im 87/101  soft-tissue]
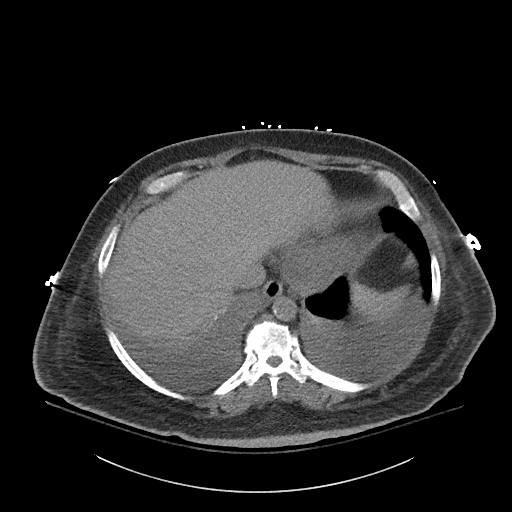
[im 96/101  soft-tissue]
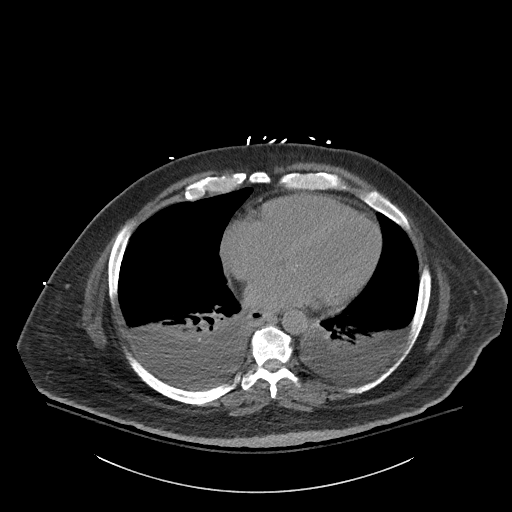

[16 of 46 positions shown; findings below may reference images not displayed]

FINDINGS: Lower chest: Small to moderate bilateral dependent pleural effusions
with associated dependent bibasilar atelectasis. Coronary
atherosclerosis. Trace pericardial effusion/thickening.

Hepatobiliary: Normal liver size. No liver mass. Cholelithiasis. No
significant gallbladder distention. No gallbladder wall thickening.
No pericholecystic fluid. No biliary ductal dilatation.

Pancreas: Normal, with no mass or duct dilation.

Spleen: Normal size. No mass.

Adrenals/Urinary Tract: Normal adrenals. No hydronephrosis. No renal
stones. No contour deforming renal masses. Nondistended bladder with
questionable diffuse bladder wall thickening.

Stomach/Bowel: Normal non-distended stomach. Normal caliber small
bowel with no small bowel wall thickening. Normal appendix. Rectum
moderately distended by stool. Mild circumferential rectal wall
thickening is new with mild haziness of perirectal fat. Otherwise
unremarkable large bowel with no diverticulosis.

Vascular/Lymphatic: Atherosclerotic nonaneurysmal abdominal aorta.
Mild bilateral external iliac lymphadenopathy measuring up to 1.0 cm
on the left (series 8/image 86) and 1.2 cm on the right (series
8/image 85), stable since [DATE] CT. No new pathologically
enlarged abdominopelvic nodes.

Reproductive: Atrophic appearing prostate.

Other: No pneumoperitoneum, ascites or focal fluid collection.

Musculoskeletal: No aggressive appearing focal osseous lesions.
Moderate thoracolumbar spondylosis.
IMPRESSION: 1. Rectum moderately distended by stool. New mild circumferential
rectal wall thickening with mild haziness of perirectal fat, cannot
exclude stercoral colitis or other nonspecific proctitis.
2. Small to moderate bilateral dependent pleural effusions with
associated dependent bibasilar atelectasis. Trace pericardial
effusion/thickening.
3. Cholelithiasis.
4. Stable mild bilateral external iliac lymphadenopathy,
nonspecific, presumably reactive.
5. Coronary atherosclerosis.
6. Aortic Atherosclerosis ([1G]-[1G]).

## 2019-05-14 MED ORDER — LACTATED RINGERS IV SOLN
INTRAVENOUS | Status: DC
  Administered 2019-05-14: 23:00:00 via INTRAVENOUS

## 2019-05-14 MED ORDER — SODIUM CHLORIDE 0.9% IV SOLUTION: Freq: Once | INTRAVENOUS | Status: DC

## 2019-05-14 MED ORDER — HYDROMORPHONE HCL 1 MG/ML IJ SOLN
1.0000 mg | Freq: Once | INTRAMUSCULAR | Status: AC
  Administered 2019-05-14: 15:00:00 1 mg via INTRAVENOUS
  Filled 2019-05-14: qty 1

## 2019-05-14 MED ORDER — BETHANECHOL CHLORIDE 25 MG PO TABS
25.0000 mg | ORAL_TABLET | Freq: Three times a day (TID) | ORAL | Status: DC
  Administered 2019-05-15 – 2019-05-24 (×26): 25 mg via ORAL
  Filled 2019-05-14 (×28): qty 1

## 2019-05-14 MED ORDER — CHOLESTYRAMINE 4 G PO PACK
8.0000 g | PACK | Freq: Two times a day (BID) | ORAL | Status: DC
  Administered 2019-05-15 – 2019-05-16 (×3): 8 g via ORAL
  Filled 2019-05-14 (×22): qty 2

## 2019-05-14 MED ORDER — DULOXETINE HCL 20 MG PO CPEP
40.0000 mg | ORAL_CAPSULE | Freq: Every day | ORAL | Status: DC
  Administered 2019-05-16 – 2019-05-24 (×9): 40 mg via ORAL
  Filled 2019-05-14 (×10): qty 2

## 2019-05-14 MED ORDER — MIDODRINE HCL 5 MG PO TABS
10.0000 mg | ORAL_TABLET | ORAL | Status: DC
  Administered 2019-05-15 – 2019-05-24 (×4): 10 mg via ORAL
  Filled 2019-05-14 (×8): qty 2

## 2019-05-14 MED ORDER — HYDROMORPHONE HCL 1 MG/ML IJ SOLN
1.0000 mg | Freq: Once | INTRAMUSCULAR | Status: AC
  Administered 2019-05-14: 16:00:00 1 mg via INTRAVENOUS
  Filled 2019-05-14: qty 1

## 2019-05-14 MED ORDER — MELATONIN 3 MG PO TABS
4.5000 mg | ORAL_TABLET | Freq: Every day | ORAL | Status: DC
  Administered 2019-05-15 – 2019-05-23 (×10): 4.5 mg via ORAL
  Filled 2019-05-14 (×12): qty 1.5

## 2019-05-14 MED ORDER — ALBUTEROL SULFATE HFA 108 (90 BASE) MCG/ACT IN AERS: 2.0000 | INHALATION_SPRAY | Freq: Four times a day (QID) | RESPIRATORY_TRACT | Status: DC | PRN

## 2019-05-14 MED ORDER — SODIUM CHLORIDE 0.9 % IV SOLN
2.0000 g | Freq: Every day | INTRAVENOUS | Status: DC
  Administered 2019-05-15 – 2019-05-16 (×2): 2 g via INTRAVENOUS
  Filled 2019-05-14: qty 2
  Filled 2019-05-14: qty 20

## 2019-05-14 MED ORDER — LIDOCAINE-PRILOCAINE &LIDO HCL 2.5-2.5 & 3.88 % EX KIT: 1.0000 "application " | PACK | CUTANEOUS | Status: DC

## 2019-05-14 MED ORDER — PANTOPRAZOLE SODIUM 40 MG PO TBEC
40.0000 mg | DELAYED_RELEASE_TABLET | Freq: Every day | ORAL | Status: DC
  Administered 2019-05-15 – 2019-05-24 (×10): 40 mg via ORAL
  Filled 2019-05-14 (×10): qty 1

## 2019-05-14 MED ORDER — OXYCODONE HCL 5 MG PO TABS
5.0000 mg | ORAL_TABLET | Freq: Four times a day (QID) | ORAL | Status: DC | PRN
  Administered 2019-05-14 – 2019-05-24 (×20): 5 mg via ORAL
  Filled 2019-05-14 (×17): qty 1

## 2019-05-14 MED ORDER — PIPERACILLIN-TAZOBACTAM 3.375 G IVPB 30 MIN
3.3750 g | Freq: Once | INTRAVENOUS | Status: AC
  Administered 2019-05-14: 19:00:00 3.375 g via INTRAVENOUS
  Filled 2019-05-14: qty 50

## 2019-05-14 MED ORDER — METRONIDAZOLE IN NACL 5-0.79 MG/ML-% IV SOLN
500.0000 mg | Freq: Three times a day (TID) | INTRAVENOUS | Status: DC
  Administered 2019-05-14 – 2019-05-16 (×4): 500 mg via INTRAVENOUS
  Filled 2019-05-14 (×4): qty 100

## 2019-05-14 MED ORDER — PIPERACILLIN-TAZOBACTAM IN DEX 2-0.25 GM/50ML IV SOLN
2.2500 g | Freq: Three times a day (TID) | INTRAVENOUS | Status: DC
  Filled 2019-05-14: qty 50

## 2019-05-14 MED ORDER — TAMSULOSIN HCL 0.4 MG PO CAPS
0.4000 mg | ORAL_CAPSULE | Freq: Every day | ORAL | Status: DC
  Administered 2019-05-16 – 2019-05-24 (×9): 0.4 mg via ORAL
  Filled 2019-05-14 (×9): qty 1

## 2019-05-14 NOTE — H&P (Addendum)
History and Physical    Michael Riggs:[id number] DOB: 1971/07/05 DOA: 05/14/2019  PCP: [name], MD  Patient coming from: rehab   Chief Complaint: rectal bleeding  HPI: Michael Riggs is a 48 y.o. male with medical history significant for esrd recently started on hemodialysis (m/w/f/sat), dm diet controlled, obesity, who presents with above.  Recent prolonged hospital course. Recently started on hemodialysis, has tunneled catheter in place. Discharged earlier this month after hospital course for LE infection c/b sepsis and code blue with intubation, icu stay, use of pressors. Discharged to rehab. Last dialysis was Friday.  Reports 4 days of constipation, a new problem. Given a stool softener at rehab and had a large output of red blood. Has had 3-4 other bloody bowel movements today. No vomiting or hematochezia. Denies history GI bleed. No abdominal pain. Denies recent med changes. Denies fevers. No chest pain or sob or cough. Denies covid contacts  ED Course: labs, CT abdomen/pelvic, gi consult ([facility]). Also discussed case with gen surg, they said doesn't appear to be a surgical concern at this time, advised GI f/u.  Review of Systems: As per HPI otherwise 10 point review of systems negative.    Past Medical History:  Diagnosis Date  . Anemia   . Depression   . Diabetes mellitus with peripheral vascular disease ([place])   . Dyslipidemia   . Dyspnea   . ESRD (end stage renal disease) on dialysis ([place]) 01/2019   MWF  . GERD (gastroesophageal reflux disease)   . Hypertension   . Physical deconditioning   . Syphilis 01/2019    Past Surgical History:  Procedure Laterality Date  . AV FISTULA PLACEMENT Left 01/09/2019   Procedure: ARTERIOVENOUS (AV) FISTULA CREATION LEFT UPPER ARM;  Surgeon: [name], MD;  Location: [facility] OR;  Service: Vascular;  Laterality: Left;  . below the knee amputation Left   . EYE SURGERY    . IR FLUORO GUIDE CV LINE RIGHT  01/31/2019   . IR FLUORO GUIDE CV LINE RIGHT  02/03/2019  . IR US GUIDE VASC ACCESS RIGHT  01/31/2019  . IR US GUIDE VASC ACCESS RIGHT  02/03/2019     reports that he quit smoking about 3 months ago. His smoking use included cigarettes. He started smoking about 5 months ago. He has never used smokeless tobacco. He reports previous alcohol use. He reports previous drug use.  No Known Allergies  History reviewed. No pertinent family history.  Prior to Admission medications   Medication Sig Start Date End Date Taking? Authorizing Provider  acetaminophen (TYLENOL) 325 MG tablet Take 2 tablets (650 mg total) by mouth every 6 (six) hours as needed for mild pain (or Fever >/= 101). 03/13/19   Angiulli, Lavon Paganini, PA-C  albuterol (VENTOLIN HFA) 108 (90 Base) MCG/ACT inhaler Inhale 2 puffs into the lungs every 6 (six) hours as needed for wheezing or shortness of breath.    [provider]  amLODipine (NORVASC) 5 MG tablet Take 1 tablet (5 mg total) by mouth at bedtime. 04/27/19   Dessa Phi, DO  atorvastatin (LIPITOR) 40 MG tablet Take 1 tablet (40 mg total) by mouth daily. 03/13/19   Angiulli, Lavon Paganini, PA-C  bethanechol (URECHOLINE) 25 MG tablet Take 1 tablet (25 mg total) by mouth 3 (three) times daily. 03/13/19   Angiulli, Lavon Paganini, PA-C  cholestyramine (QUESTRAN) 4 g packet Take 2 packets (8 g total) by mouth 2 (two) times daily. 04/27/19   Dessa Phi, DO  collagenase (SANTYL) ointment Apply 1 application topically daily. Apply to the affected area daily plus dry dressing 05/09/19   [name], MD  DULoxetine HCl 40 MG CPEP Take 40 mg by mouth daily. 04/27/19   Dessa Phi, DO  hydrocortisone (ANUSOL-HC) 25 MG suppository Place 1 suppository (25 mg total) rectally 2 (two) times daily as needed for hemorrhoids or anal itching. 04/27/19   Dessa Phi, DO  Hydrocortisone (GERHARDT'S BUTT CREAM) CREA Apply 1 application topically 3 (three) times daily as needed for irritation. 04/27/19   Dessa Phi, DO  Lidocaine-Prilocaine &Lido HCl 2.5-2.5 & 3.88 % KIT Apply 1 application topically every Monday, Wednesday, and Friday. Apply to fistula and wrap with saran wrap site prior to dialysys    [provider]  Melatonin 3 MG TABS Take 1.5 tablets (4.5 mg total) by mouth at bedtime. 02/08/19   Alma Friendly, MD  midodrine (PROAMATINE) 10 MG tablet Take 1 tablet (10 mg total) by mouth every Monday, Wednesday, and Friday. 04/28/19 05/28/19  Dessa Phi, DO  oxyCODONE (OXY IR/ROXICODONE) 5 MG immediate release tablet Take 1 tablet (5 mg total) by mouth every 6 (six) hours as needed for severe pain or breakthrough pain. 04/27/19   Dessa Phi, DO  pantoprazole (PROTONIX) 40 MG tablet Take 1 tablet (40 mg total) by mouth daily. 03/13/19   Angiulli, Lavon Paganini, PA-C  polyethylene glycol (MIRALAX / GLYCOLAX) 17 g packet Take 17 g by mouth daily. Patient taking differently: Take 17 g by mouth daily as needed for mild constipation or moderate constipation. 4-8 ounces 02/09/19   Alma Friendly, MD  tamsulosin (FLOMAX) 0.4 MG CAPS capsule Take 1 capsule (0.4 mg total) by mouth daily after breakfast. 03/13/19   Angiulli, Lavon Paganini, PA-C  traMADol (ULTRAM) 50 MG tablet Take 1 tablet (50 mg total) by mouth every 6 (six) hours as needed for moderate pain. 04/27/19   Dessa Phi, DO    Physical Exam: Vitals:   05/14/19 1415 05/14/19 1530 05/14/19 1600 05/14/19 1736  BP: 121/72 125/77 111/68 117/61  Pulse: 80 94 91 97  Resp: 16 (!) 21  (!) 24  Temp:      TempSrc:      SpO2: (!) 88% 92% 94% 97%  Weight:      Height:        Constitutional: No acute distress, chronically ill appearing Head: Atraumatic Eyes: Conjunctiva clear ENM: Moist mucous membranes. poordentition.  Neck: Supple Respiratory: Clear to auscultation bilaterally, no wheezing/rales/rhonchi. Normal respiratory effort. No accessory muscle use. . Cardiovascular: Regular rate and rhythm. Soft systolic murmur Abdomen:  obese, mild ttp throughout, no rebound or guarding Musculoskeletal: decreased tone Skin: extensive exchars throughout lower extremities and stage 2 sacral decubitus ulcer Extremities: left bka. See skin  Neurologic: Alert, moving all 4 extremities. Psychiatric: Normal insight and judgement.   Labs on Admission: I have personally reviewed following labs and imaging studies  CBC: Recent Labs  Lab 05/14/19 1323  WBC 12.4*  NEUTROABS 10.0*  HGB 9.2*  HCT 30.3*  MCV 92.1  PLT 786   Basic Metabolic Panel: Recent Labs  Lab 05/14/19 1323  NA 138  K 4.7  CL 103  CO2 24  GLUCOSE 116*  BUN 35*  CREATININE 6.04*  CALCIUM 8.3*   GFR: Estimated Creatinine Clearance: 18.6 mL/min (A) (by C-G formula based on SCr of 6.04 mg/dL (H)). Liver Function Tests: Recent Labs  Lab 05/14/19 1323  AST 26  ALT 12  ALKPHOS 72  BILITOT 1.1  PROT 7.1  ALBUMIN 2.0*   Recent Labs  Lab 05/14/19 1323  LIPASE 40   No results for input(s): AMMONIA in the last 168 hours. Coagulation Profile: No results for input(s): INR, PROTIME in the last 168 hours. Cardiac Enzymes: No results for input(s): CKTOTAL, CKMB, CKMBINDEX, TROPONINI in the last 168 hours. BNP (last 3 results) No results for input(s): PROBNP in the last 8760 hours. HbA1C: No results for input(s): HGBA1C in the last 72 hours. CBG: No results for input(s): GLUCAP in the last 168 hours. Lipid Profile: No results for input(s): CHOL, HDL, LDLCALC, TRIG, CHOLHDL, LDLDIRECT in the last 72 hours. Thyroid Function Tests: No results for input(s): TSH, T4TOTAL, FREET4, T3FREE, THYROIDAB in the last 72 hours. Anemia Panel: No results for input(s): VITAMINB12, FOLATE, FERRITIN, TIBC, IRON, RETICCTPCT in the last 72 hours. Urine analysis:    Component Value Date/Time   COLORURINE YELLOW 03/10/2019 1054   APPEARANCEUR TURBID (A) 03/10/2019 1054   LABSPEC 1.020 03/10/2019 1054   PHURINE 5.0 03/10/2019 1054   GLUCOSEU 50 (A) 03/10/2019  1054   HGBUR SMALL (A) 03/10/2019 1054   BILIRUBINUR NEGATIVE 03/10/2019 1054   [place] 03/10/2019 1054   PROTEINUR >=300 (A) 03/10/2019 1054   NITRITE NEGATIVE 03/10/2019 1054   LEUKOCYTESUR LARGE (A) 03/10/2019 1054    Radiological Exams on Admission: Ct Abdomen Pelvis Wo Contrast  Result Date: 05/14/2019 CLINICAL DATA:  Rectal bleeding this morning. Constipation. Abdominal distension. EXAM: CT ABDOMEN AND PELVIS WITHOUT CONTRAST TECHNIQUE: Multidetector CT imaging of the abdomen and pelvis was performed following the standard protocol without IV contrast. COMPARISON:  04/27/2019 abdominal radiograph. 02/11/2019 CT abdomen/pelvis. FINDINGS: Lower chest: Small to moderate bilateral dependent pleural effusions with associated dependent bibasilar atelectasis. Coronary atherosclerosis. Trace pericardial effusion/thickening. Hepatobiliary: Normal liver size. No liver mass. Cholelithiasis. No significant gallbladder distention. No gallbladder wall thickening. No pericholecystic fluid. No biliary ductal dilatation. Pancreas: Normal, with no mass or duct dilation. Spleen: Normal size. No mass. Adrenals/Urinary Tract: Normal adrenals. No hydronephrosis. No renal stones. No contour deforming renal masses. Nondistended bladder with questionable diffuse bladder wall thickening. Stomach/Bowel: Normal non-distended stomach. Normal caliber small bowel with no small bowel wall thickening. Normal appendix. Rectum moderately distended by stool. Mild circumferential rectal wall thickening is new with mild haziness of perirectal fat. Otherwise unremarkable large bowel with no diverticulosis. Vascular/Lymphatic: Atherosclerotic nonaneurysmal abdominal aorta. Mild bilateral external iliac lymphadenopathy measuring up to 1.0 cm on the left (series 8/image 86) and 1.2 cm on the right (series 8/image 85), stable since 02/11/2019 CT. No new pathologically enlarged abdominopelvic nodes. Reproductive: Atrophic  appearing prostate. Other: No pneumoperitoneum, ascites or focal fluid collection. Musculoskeletal: No aggressive appearing focal osseous lesions. Moderate thoracolumbar spondylosis. IMPRESSION: 1. Rectum moderately distended by stool. New mild circumferential rectal wall thickening with mild haziness of perirectal fat, cannot exclude stercoral colitis or other nonspecific proctitis. 2. Small to moderate bilateral dependent pleural effusions with associated dependent bibasilar atelectasis. Trace pericardial effusion/thickening. 3. Cholelithiasis. 4. Stable mild bilateral external iliac lymphadenopathy, nonspecific, presumably reactive. 5. Coronary atherosclerosis. 6. Aortic Atherosclerosis (ICD10-I70.0). Electronically Signed   By: Ilona Sorrel M.D.   On: 05/14/2019 17:11    EKG: Independently reviewed. qtc 570, nsr  Assessment/Plan Principal Problem:   Lower GI bleed Active Problems:   Diabetes mellitus type 2 in obese ([facility])   End stage renal disease ([place])   Controlled type 2 diabetes mellitus with hyperglycemia, without long-term current use of insulin ([place])   Hypertension  Calciphylaxis   Pressure injury of skin   # Lower GI bleed - 4 days of constipation and one day of significant BRBPR. Constipation/hemorrhoids possible. CT shows rectal wall thickening. Possible ischemic colitis? Patient certainly has risk factors for such. stercol colitis also on ddx. Hemodynamically stable. Has access via hemodialysis catheter - gi consulted, appreciate recs, will see in AM. - 2 units held - npo - s/p zosyn, ordered for ceftriaxone/flagyl  # Anemia - normocytic. h 9.2, stable, in setting of bleeding above - trend h/h  # stage 2 sacral decub # Lower extremity ulcers - extensive. Infected last hospitalization, do not appear so now. Thought to be possibly 2/2 calciphylaxis. - wound care consult  # ESRD - does not appear to be volume overloaded, k normal, breathing comfortably. Says on m/w/f/sat  dialysis, didn't have yesterday - will need nephro consult in AM  # Lower urinary tract symptoms - cont home flomax and urecholine  # chronic pain - cont home oxyodone prn  # hypotension - continue home midodrine w/ dialysis  # htn - hold home amlodipine in setting of GIB - hold home atorvastatin  # OSA - cont home cpap  # Dm - diet controlled. Glucose wnl - daily fasting sugars  # qtc prolongation - 570 - noted, avoid qtc prolongers - tele overnight    DVT prophylaxis: nothing for now  Code Status: full  Family Communication: brother  Disposition Plan: tbd  Consults called: gi ([facility])  Admission status: med/surg    Desma Maxim MD [facility] Pager [phone]  If 7PM-7AM, please contact night-coverage [email or web address] Password [facility]  05/14/2019, 6:28 PM

## 2019-05-14 NOTE — ED Triage Notes (Signed)
Patient arrived via EMS from [facility]. Reports episode of large bleeding from rectum at 1000 this morning. Reports recent constipation last night and took Mira lax. Patient alert and oriented. Complains for intermittent pain in rectum, bright red blood present. EDP at bedside.

## 2019-05-14 NOTE — ED Notes (Signed)
Date and time results received: 05/14/19 (use smartphrase ".now" to insert current time)  Test: lactic acid  Critical Value: 2.1  Name of Provider Notified: wouk, MD  Orders Received? Or Actions Taken?: no new orders at this time

## 2019-05-14 NOTE — Progress Notes (Signed)
Pharmacy Antibiotic Note  Michael Riggs is a 48 y.o. male admitted on 05/14/2019 with intra-abdominal infection.  Pharmacy has been consulted for Zosyn dosing. ESRD on HD.  Plan: Zosyn 3.375g IV (34min infusion) x1; then 2.25g IV q8h Monitor clinical progress, c/s, abx plan/LOT Pre-HD vancomycin level as indicated F/u HD schedule/tolerance inpatient   Height: 5\' 10"  (177.8 cm) Weight: 237 lb (107.5 kg) IBW/kg (Calculated) : 73  Temp (24hrs), Avg:98.3 F (36.8 C), Min:98.3 F (36.8 C), Max:98.3 F (36.8 C)  Recent Labs  Lab 05/14/19 1323  WBC 12.4*  CREATININE 6.04*    Estimated Creatinine Clearance: 18.6 mL/min (A) (by C-G formula based on SCr of 6.04 mg/dL (H)).    No Known Allergies  Elicia Lamp, PharmD, BCPS Please check AMION for all [place] contact numbers Clinical Pharmacist 05/14/2019 6:16 PM

## 2019-05-14 NOTE — ED Notes (Signed)
Notified Dr. Ronnald Nian of patient having a large bloody bowel movement at this time. Patient has had 3 incontinent bloody bm's since arriving. Pt is asymptomatic at this time. VSS

## 2019-05-14 NOTE — ED Provider Notes (Signed)
[facility] EMERGENCY DEPARTMENT Provider Note   CSN: [id number] Arrival date & time: 05/14/19  1249     History   Chief Complaint Chief Complaint  Patient presents with  . GI Bleeding    Rectal Bleeding    HPI Michael Riggs is a 48 y.o. male.     The history is provided by the patient.  Rectal Bleeding Quality:  Bright red Amount:  Copious Duration:  2 hours Timing:  Intermittent Chronicity:  New Context: constipation   Relieved by:  Nothing Worsened by:  Nothing Associated symptoms: abdominal pain   Associated symptoms: no fever and no vomiting   Risk factors: no anticoagulant use   Risk factors comment:  Hx of ESRD   Past Medical History:  Diagnosis Date  . Anemia   . Depression   . Diabetes mellitus with peripheral vascular disease ([place])   . Dyslipidemia   . Dyspnea   . ESRD (end stage renal disease) on dialysis ([place]) 01/2019   MWF  . GERD (gastroesophageal reflux disease)   . Hypertension   . Physical deconditioning   . Syphilis 01/2019    Patient Active Problem List   Diagnosis Date Noted  . Pressure injury of skin 04/27/2019  . Palliative care encounter   . Goals of care, counseling/discussion   . Calciphylaxis   . Wound infection 03/28/2019  . Hypertension   . Dyslipidemia   . Diabetes mellitus with peripheral vascular disease ([place])   . Depression   . Physical deconditioning   . Pruritus   . Scrotal pain   . Labile blood pressure   . Scrotal edema   . Status post below-knee amputation of left lower extremity ([place])   . Slow transit constipation   . Leukocytosis   . Sleep disturbance   . Essential hypertension   . Anemia of chronic disease   . Controlled type 2 diabetes mellitus with hyperglycemia, without long-term current use of insulin ([place])   . ESRD on dialysis ([place])   . Urinary retention   . Debility 02/09/2019  . Fluid overload 02/04/2019  . Hyperkalemia 01/30/2019  . ARF (acute renal failure) ([place])  01/30/2019  . Hypokalemia 01/30/2019  . Hypoglycemia 01/30/2019  . Syphilis 01/30/2019  . Macrocytic anemia 01/30/2019  . End stage renal disease ([place])   . ESRD (end stage renal disease) on dialysis ([place]) 01/2019  . PDR (proliferative diabetic retinopathy) ([place]) 12/14/2013  . Venous hypertension 09/22/2013  . Diabetes mellitus type 2 in obese ([place]) 07/04/2013  . Habitual alcohol use 07/04/2013  . Obesity, Class II, BMI 35-39.9, with comorbidity 07/04/2013    Past Surgical History:  Procedure Laterality Date  . AV FISTULA PLACEMENT Left 01/09/2019   Procedure: ARTERIOVENOUS (AV) FISTULA CREATION LEFT UPPER ARM;  Surgeon: [name], MD;  Location: [facility] OR;  Service: Vascular;  Laterality: Left;  . below the knee amputation Left   . EYE SURGERY    . IR FLUORO GUIDE CV LINE RIGHT  01/31/2019  . IR FLUORO GUIDE CV LINE RIGHT  02/03/2019  . IR US GUIDE VASC ACCESS RIGHT  01/31/2019  . IR US GUIDE VASC ACCESS RIGHT  02/03/2019        Home Medications    Prior to Admission medications   Medication Sig Start Date End Date Taking? Authorizing Provider  acetaminophen (TYLENOL) 325 MG tablet Take 2 tablets (650 mg total) by mouth every 6 (six) hours as needed for mild pain (or Fever >/= 101). 03/13/19  Angiulli, Lavon Paganini, PA-C  albuterol (VENTOLIN HFA) 108 (90 Base) MCG/ACT inhaler Inhale 2 puffs into the lungs every 6 (six) hours as needed for wheezing or shortness of breath.    [provider]  amLODipine (NORVASC) 5 MG tablet Take 1 tablet (5 mg total) by mouth at bedtime. 04/27/19   Dessa Phi, DO  atorvastatin (LIPITOR) 40 MG tablet Take 1 tablet (40 mg total) by mouth daily. 03/13/19   Angiulli, Lavon Paganini, PA-C  bethanechol (URECHOLINE) 25 MG tablet Take 1 tablet (25 mg total) by mouth 3 (three) times daily. 03/13/19   Angiulli, Lavon Paganini, PA-C  cholestyramine (QUESTRAN) 4 g packet Take 2 packets (8 g total) by mouth 2 (two) times daily. 04/27/19   Dessa Phi, DO  collagenase  (SANTYL) ointment Apply 1 application topically daily. Apply to the affected area daily plus dry dressing 05/09/19   [name], MD  DULoxetine HCl 40 MG CPEP Take 40 mg by mouth daily. 04/27/19   Dessa Phi, DO  hydrocortisone (ANUSOL-HC) 25 MG suppository Place 1 suppository (25 mg total) rectally 2 (two) times daily as needed for hemorrhoids or anal itching. 04/27/19   Dessa Phi, DO  Hydrocortisone (GERHARDT'S BUTT CREAM) CREA Apply 1 application topically 3 (three) times daily as needed for irritation. 04/27/19   Dessa Phi, DO  Lidocaine-Prilocaine &Lido HCl 2.5-2.5 & 3.88 % KIT Apply 1 application topically every Monday, Wednesday, and Friday. Apply to fistula and wrap with saran wrap site prior to dialysys    [provider]  Melatonin 3 MG TABS Take 1.5 tablets (4.5 mg total) by mouth at bedtime. 02/08/19   Alma Friendly, MD  midodrine (PROAMATINE) 10 MG tablet Take 1 tablet (10 mg total) by mouth every Monday, Wednesday, and Friday. 04/28/19 05/28/19  Dessa Phi, DO  oxyCODONE (OXY IR/ROXICODONE) 5 MG immediate release tablet Take 1 tablet (5 mg total) by mouth every 6 (six) hours as needed for severe pain or breakthrough pain. 04/27/19   Dessa Phi, DO  pantoprazole (PROTONIX) 40 MG tablet Take 1 tablet (40 mg total) by mouth daily. 03/13/19   Angiulli, Lavon Paganini, PA-C  polyethylene glycol (MIRALAX / GLYCOLAX) 17 g packet Take 17 g by mouth daily. Patient taking differently: Take 17 g by mouth daily as needed for mild constipation or moderate constipation. 4-8 ounces 02/09/19   Alma Friendly, MD  tamsulosin (FLOMAX) 0.4 MG CAPS capsule Take 1 capsule (0.4 mg total) by mouth daily after breakfast. 03/13/19   Angiulli, Lavon Paganini, PA-C  traMADol (ULTRAM) 50 MG tablet Take 1 tablet (50 mg total) by mouth every 6 (six) hours as needed for moderate pain. 04/27/19   Dessa Phi, DO    Family History History reviewed. No pertinent family history.  Social  History Social History   Tobacco Use  . Smoking status: Former Smoker    Types: Cigarettes    Start date: 11/18/2018    Quit date: 01/18/2019    Years since quitting: 0.3  . Smokeless tobacco: Never Used  . Tobacco comment: pt stated got bored with it  Substance Use Topics  . Alcohol use: Not Currently    Comment: heavy drinker in the past, none since 07/19/18  . Drug use: Not Currently     Allergies   Patient has no known allergies.   Review of Systems Review of Systems  Constitutional: Negative for chills and fever.  HENT: Negative for ear pain and sore throat.   Eyes: Negative for pain  and visual disturbance.  Respiratory: Negative for cough and shortness of breath.   Cardiovascular: Negative for chest pain and palpitations.  Gastrointestinal: Positive for abdominal pain, blood in stool, constipation and hematochezia. Negative for vomiting.  Genitourinary: Negative for dysuria and hematuria.  Musculoskeletal: Negative for arthralgias and back pain.  Skin: Negative for color change and rash.  Neurological: Negative for seizures and syncope.  All other systems reviewed and are negative.    Physical Exam Updated Vital Signs BP 117/61   Pulse 97   Temp 98.3 F (36.8 C) (Oral)   Resp (!) 24   Ht '5\' 10"'  (1.778 m)   Wt 107.5 kg   SpO2 97%   BMI 34.01 kg/m   Physical Exam Vitals signs and nursing note reviewed.  Constitutional:      Appearance: He is well-developed.  HENT:     Head: Normocephalic and atraumatic.     Right Ear: Tympanic membrane normal.     Nose: Nose normal.  Eyes:     Extraocular Movements: Extraocular movements intact.     Conjunctiva/sclera: Conjunctivae normal.     Pupils: Pupils are equal, round, and reactive to light.     Comments: Pale conjunctiva  Neck:     Musculoskeletal: Normal range of motion and neck supple.  Cardiovascular:     Rate and Rhythm: Normal rate and regular rhythm.     Pulses: Normal pulses.     Heart sounds: Normal  heart sounds. No murmur.  Pulmonary:     Effort: Pulmonary effort is normal. No respiratory distress.     Breath sounds: Normal breath sounds.  Abdominal:     General: There is no distension.     Palpations: Abdomen is soft.     Tenderness: There is abdominal tenderness.  Genitourinary:    Rectum: Guaiac result positive.     Comments: Large amount of bright red blood intermixed with stool, loose on rectal exam Musculoskeletal: Normal range of motion.  Skin:    General: Skin is warm and dry.     Capillary Refill: Capillary refill takes less than 2 seconds.  Neurological:     General: No focal deficit present.     Mental Status: He is alert.  Psychiatric:        Mood and Affect: Mood normal.      ED Treatments / Results  Labs (all labs ordered are listed, but only abnormal results are displayed) Labs Reviewed  CBC WITH DIFFERENTIAL/PLATELET - Abnormal; Notable for the following components:      Result Value   WBC 12.4 (*)    RBC 3.29 (*)    Hemoglobin 9.2 (*)    HCT 30.3 (*)    RDW 16.7 (*)    Neutro Abs 10.0 (*)    All other components within normal limits  BASIC METABOLIC PANEL - Abnormal; Notable for the following components:   Glucose, Bld 116 (*)    BUN 35 (*)    Creatinine, Ser 6.04 (*)    Calcium 8.3 (*)    GFR calc non Af Amer 10 (*)    GFR calc Af Amer 12 (*)    All other components within normal limits  HEPATIC FUNCTION PANEL - Abnormal; Notable for the following components:   Albumin 2.0 (*)    Bilirubin, Direct 0.3 (*)    All other components within normal limits  POC OCCULT BLOOD, ED - Abnormal; Notable for the following components:   Fecal Occult Bld POSITIVE (*)  All other components within normal limits  SARS CORONAVIRUS 2 BY RT PCR (HOSPITAL ORDER, [facility])  LIPASE, BLOOD  LACTIC ACID, PLASMA  LACTIC ACID, PLASMA  CBC  TYPE AND SCREEN    EKG EKG Interpretation  Date/Time:  Sunday May 14 2019 15:09:40  EDT Ventricular Rate:  91 PR Interval:    QRS Duration: 98 QT Interval:  463 QTC Calculation: 570 R Axis:   106 Text Interpretation:  Sinus rhythm Right axis deviation Abnormal R-wave progression, early transition Nonspecific T abnormalities, lateral leads Prolonged QT interval Confirmed by Lennice Sites [phone]) on 05/14/2019 3:24:51 PM   Radiology Ct Abdomen Pelvis Wo Contrast  Result Date: 05/14/2019 CLINICAL DATA:  Rectal bleeding this morning. Constipation. Abdominal distension. EXAM: CT ABDOMEN AND PELVIS WITHOUT CONTRAST TECHNIQUE: Multidetector CT imaging of the abdomen and pelvis was performed following the standard protocol without IV contrast. COMPARISON:  04/27/2019 abdominal radiograph. 02/11/2019 CT abdomen/pelvis. FINDINGS: Lower chest: Small to moderate bilateral dependent pleural effusions with associated dependent bibasilar atelectasis. Coronary atherosclerosis. Trace pericardial effusion/thickening. Hepatobiliary: Normal liver size. No liver mass. Cholelithiasis. No significant gallbladder distention. No gallbladder wall thickening. No pericholecystic fluid. No biliary ductal dilatation. Pancreas: Normal, with no mass or duct dilation. Spleen: Normal size. No mass. Adrenals/Urinary Tract: Normal adrenals. No hydronephrosis. No renal stones. No contour deforming renal masses. Nondistended bladder with questionable diffuse bladder wall thickening. Stomach/Bowel: Normal non-distended stomach. Normal caliber small bowel with no small bowel wall thickening. Normal appendix. Rectum moderately distended by stool. Mild circumferential rectal wall thickening is new with mild haziness of perirectal fat. Otherwise unremarkable large bowel with no diverticulosis. Vascular/Lymphatic: Atherosclerotic nonaneurysmal abdominal aorta. Mild bilateral external iliac lymphadenopathy measuring up to 1.0 cm on the left (series 8/image 86) and 1.2 cm on the right (series 8/image 85), stable since 02/11/2019  CT. No new pathologically enlarged abdominopelvic nodes. Reproductive: Atrophic appearing prostate. Other: No pneumoperitoneum, ascites or focal fluid collection. Musculoskeletal: No aggressive appearing focal osseous lesions. Moderate thoracolumbar spondylosis. IMPRESSION: 1. Rectum moderately distended by stool. New mild circumferential rectal wall thickening with mild haziness of perirectal fat, cannot exclude stercoral colitis or other nonspecific proctitis. 2. Small to moderate bilateral dependent pleural effusions with associated dependent bibasilar atelectasis. Trace pericardial effusion/thickening. 3. Cholelithiasis. 4. Stable mild bilateral external iliac lymphadenopathy, nonspecific, presumably reactive. 5. Coronary atherosclerosis. 6. Aortic Atherosclerosis (ICD10-I70.0). Electronically Signed   By: Ilona Sorrel M.D.   On: 05/14/2019 17:11    Procedures Procedures (including critical care time)  Medications Ordered in ED Medications  HYDROmorphone (DILAUDID) injection 1 mg (1 mg Intravenous Given 05/14/19 1446)  HYDROmorphone (DILAUDID) injection 1 mg (1 mg Intravenous Given 05/14/19 1615)     Initial Impression / Assessment and Plan / ED Course  I have reviewed the triage vital signs and the nursing notes.  Pertinent labs & imaging results that were available during my care of the patient were reviewed by me and considered in my medical decision making (see chart for details).   Michael Riggs is a 48 year old male with history of end-stage renal disease on hemodialysis who presents the ED with rectal bleeding.  Patient with normal vitals.  No fever.  Patient with multiple large bright red bloody bowel movements this morning.  Patient states that he was constipated for the last 4 days, took MiraLAX and has had now multiple large bloody bowel movements.  On exam has large amount of copious bright red blood/stool on exam.  Hemodynamically stable.  Although  patient does look pale.   Has a history of renal disease so at risk for low hemoglobin.  Has had intense cramping abdominal pain.  Will get lab work including type and screen.  We will get a CT of abdomen and pelvis.  Possibly this is secondary to colitis versus hemorrhoid, polyp.  Anticipate admission.    Patient with positive Hemoccult.  Hemoglobin appears stable at 9.2.  Mild leukocytosis at 12.4.  Electrolytes unremarkable.  Creatinine at baseline.  Has had 2 more episodes of bloody bowel movements.  Awaiting CT scan and anticipate admission to hospitalist service.  CT scan shows likely proctitis, stercoral colitis.  No abscess, no bowel perforation.  Will start IV Zosyn.  Discussed with [facility], Dr. Ardis Hughs, who will evaluate the patient tomorrow.  Has had 4 total bowel movements that were bloody.  Although hemodynamically stable.  We will get a second CBC as patient has been here for 5 hours.  Will admit to the hospitalist for further care.  This chart was dictated using voice recognition software.  Despite best efforts to proofread,  errors can occur which can change the documentation meaning.    Final Clinical Impressions(s) / ED Diagnoses   Final diagnoses:  Hematochezia  Colitis    ED Discharge Orders    None       Lennice Sites, DO 05/14/19 1800

## 2019-05-15 ENCOUNTER — Inpatient Hospital Stay (HOSPITAL_COMMUNITY): Payer: Medicare Other

## 2019-05-15 ENCOUNTER — Encounter (HOSPITAL_COMMUNITY): Admission: EM | Disposition: A | Discharge status: Skilled Nursing Facility | Payer: Self-pay | Attending: Student

## 2019-05-15 ENCOUNTER — Inpatient Hospital Stay (HOSPITAL_COMMUNITY): Payer: Medicare Other | Admitting: Certified Registered Nurse Anesthetist

## 2019-05-15 ENCOUNTER — Encounter (HOSPITAL_COMMUNITY): Payer: Self-pay | Admitting: Gastroenterology

## 2019-05-15 DIAGNOSIS — K625 Hemorrhage of anus and rectum: Secondary | ICD-10-CM

## 2019-05-15 DIAGNOSIS — K5900 Constipation, unspecified: Secondary | ICD-10-CM | POA: Diagnosis not present

## 2019-05-15 DIAGNOSIS — E669 Obesity, unspecified: Secondary | ICD-10-CM

## 2019-05-15 DIAGNOSIS — K6289 Other specified diseases of anus and rectum: Secondary | ICD-10-CM

## 2019-05-15 DIAGNOSIS — K921 Melena: Secondary | ICD-10-CM

## 2019-05-15 DIAGNOSIS — K529 Noninfective gastroenteritis and colitis, unspecified: Secondary | ICD-10-CM | POA: Diagnosis not present

## 2019-05-15 DIAGNOSIS — R935 Abnormal findings on diagnostic imaging of other abdominal regions, including retroperitoneum: Secondary | ICD-10-CM | POA: Diagnosis not present

## 2019-05-15 DIAGNOSIS — E1165 Type 2 diabetes mellitus with hyperglycemia: Secondary | ICD-10-CM

## 2019-05-15 DIAGNOSIS — E1169 Type 2 diabetes mellitus with other specified complication: Secondary | ICD-10-CM

## 2019-05-15 DIAGNOSIS — K626 Ulcer of anus and rectum: Secondary | ICD-10-CM | POA: Diagnosis not present

## 2019-05-15 DIAGNOSIS — I10 Essential (primary) hypertension: Secondary | ICD-10-CM

## 2019-05-15 HISTORY — PX: HEMOSTASIS CLIP PLACEMENT: SHX6857

## 2019-05-15 HISTORY — PX: FLEXIBLE SIGMOIDOSCOPY: SHX5431

## 2019-05-15 HISTORY — PX: HOT HEMOSTASIS: SHX5433

## 2019-05-15 HISTORY — PX: SCLEROTHERAPY: SHX6841

## 2019-05-15 LAB — CBG MONITORING, ED
Glucose-Capillary: 66 mg/dL — ABNORMAL LOW (ref 70–99)
Glucose-Capillary: 127 mg/dL — ABNORMAL HIGH (ref 70–99)
Glucose-Capillary: 74 mg/dL (ref 70–99)

## 2019-05-15 LAB — POCT I-STAT, CHEM 8
BUN: 46 mg/dL — ABNORMAL HIGH (ref 6–20)
Calcium, Ion: 1.09 mmol/L — ABNORMAL LOW (ref 1.15–1.40)
Chloride: 104 mmol/L (ref 98–111)
Creatinine, Ser: 7.6 mg/dL — ABNORMAL HIGH (ref 0.61–1.24)
Glucose, Bld: 79 mg/dL (ref 70–99)
HCT: 23 % — ABNORMAL LOW (ref 39.0–52.0)
Hemoglobin: 7.8 g/dL — ABNORMAL LOW (ref 13.0–17.0)
Potassium: 4.3 mmol/L (ref 3.5–5.1)
Sodium: 138 mmol/L (ref 135–145)
TCO2: 26 mmol/L (ref 22–32)

## 2019-05-15 LAB — CBC
HCT: 21.9 % — ABNORMAL LOW (ref 39.0–52.0)
Hemoglobin: 6.5 g/dL — CL (ref 13.0–17.0)
MCH: 27.8 pg (ref 26.0–34.0)
MCHC: 29.7 g/dL — ABNORMAL LOW (ref 30.0–36.0)
MCV: 93.6 fL (ref 80.0–100.0)
Platelets: 295 10*3/uL (ref 150–400)
RBC: 2.34 MIL/uL — ABNORMAL LOW (ref 4.22–5.81)
RDW: 16.6 % — ABNORMAL HIGH (ref 11.5–15.5)
WBC: 9.4 10*3/uL (ref 4.0–10.5)
nRBC: 0 % (ref 0.0–0.2)

## 2019-05-15 LAB — GLUCOSE, RANDOM: Glucose, Bld: 73 mg/dL (ref 70–99)

## 2019-05-15 LAB — PREPARE RBC (CROSSMATCH)

## 2019-05-15 IMAGING — DX DG ABDOMEN 2V
4 series · 4 of 4 positions shown · non-contrast
Comparison: CT scan [DATE]

CLINICAL DATA: Lower GI bleed.

EXAM:
ABDOMEN - 2 VIEW

[abdomen supine (1 of 2)]
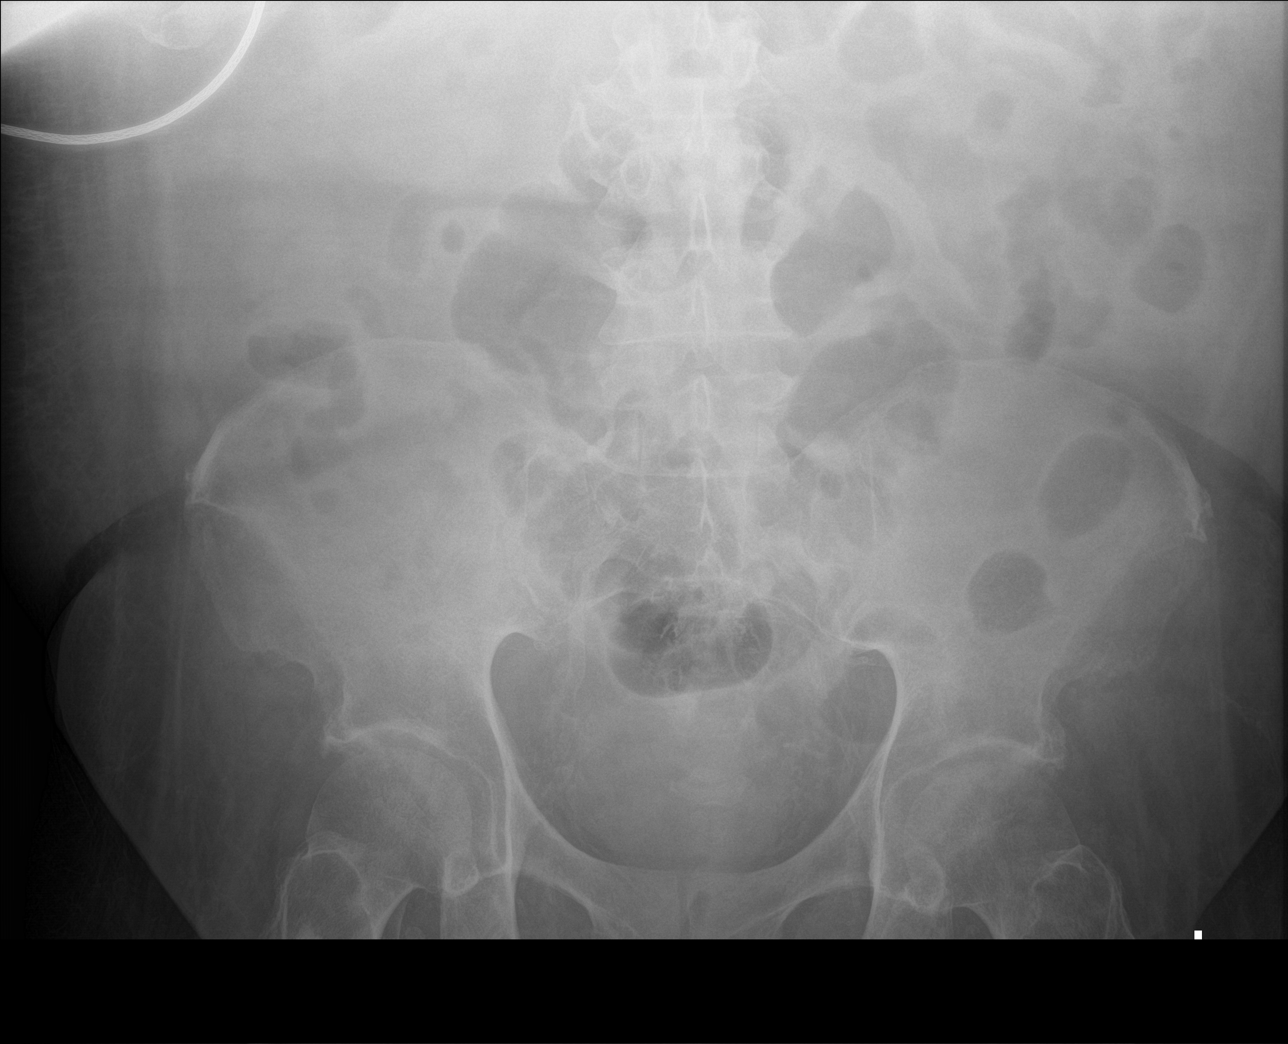

[abdomen supine (2 of 2)]
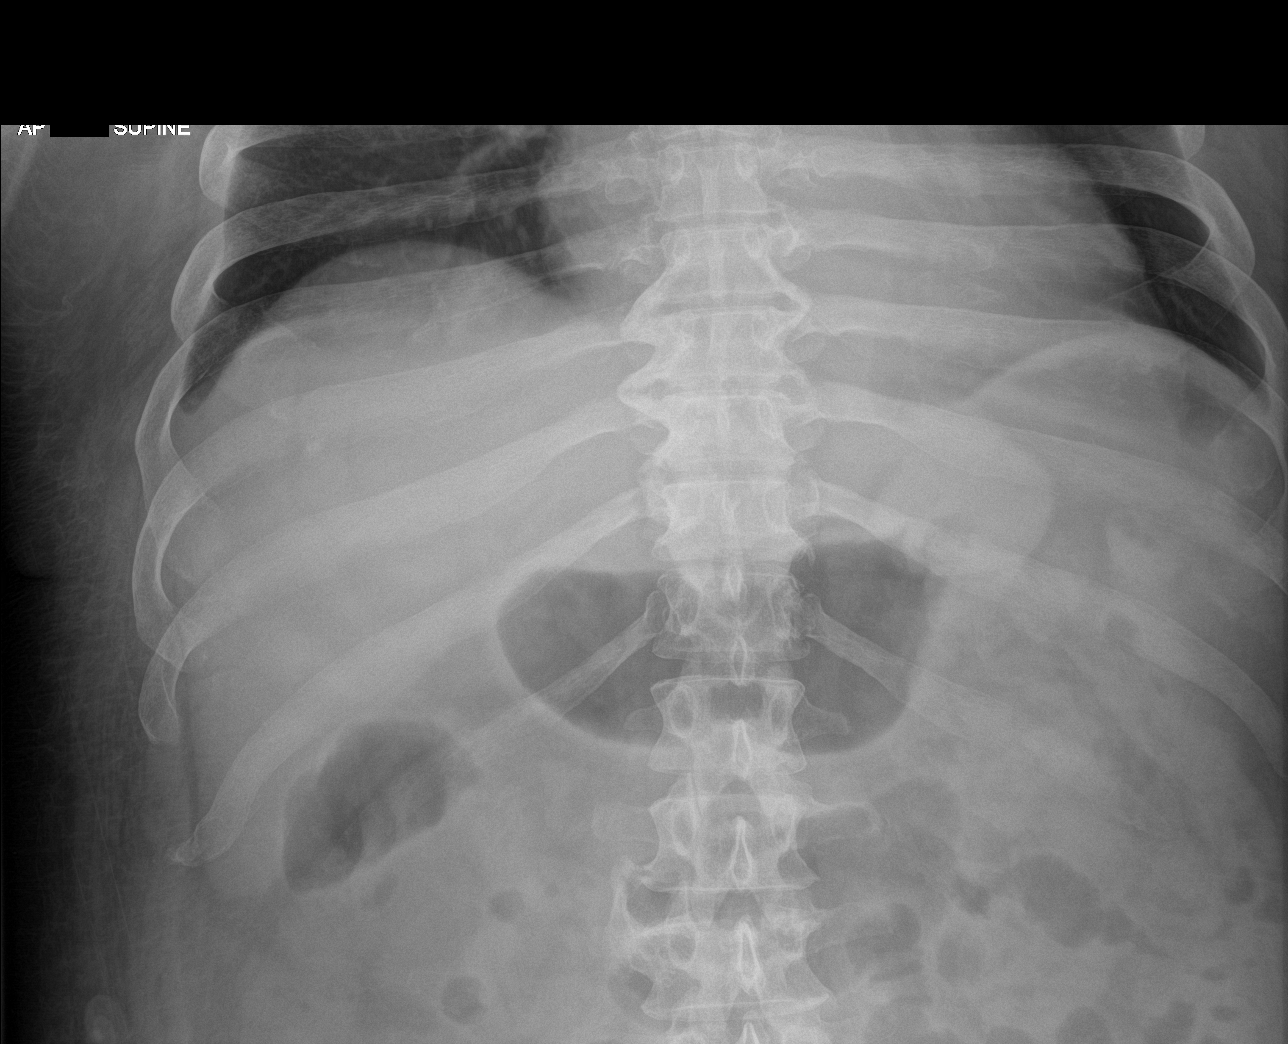

[abdomen erect (1 of 2)]
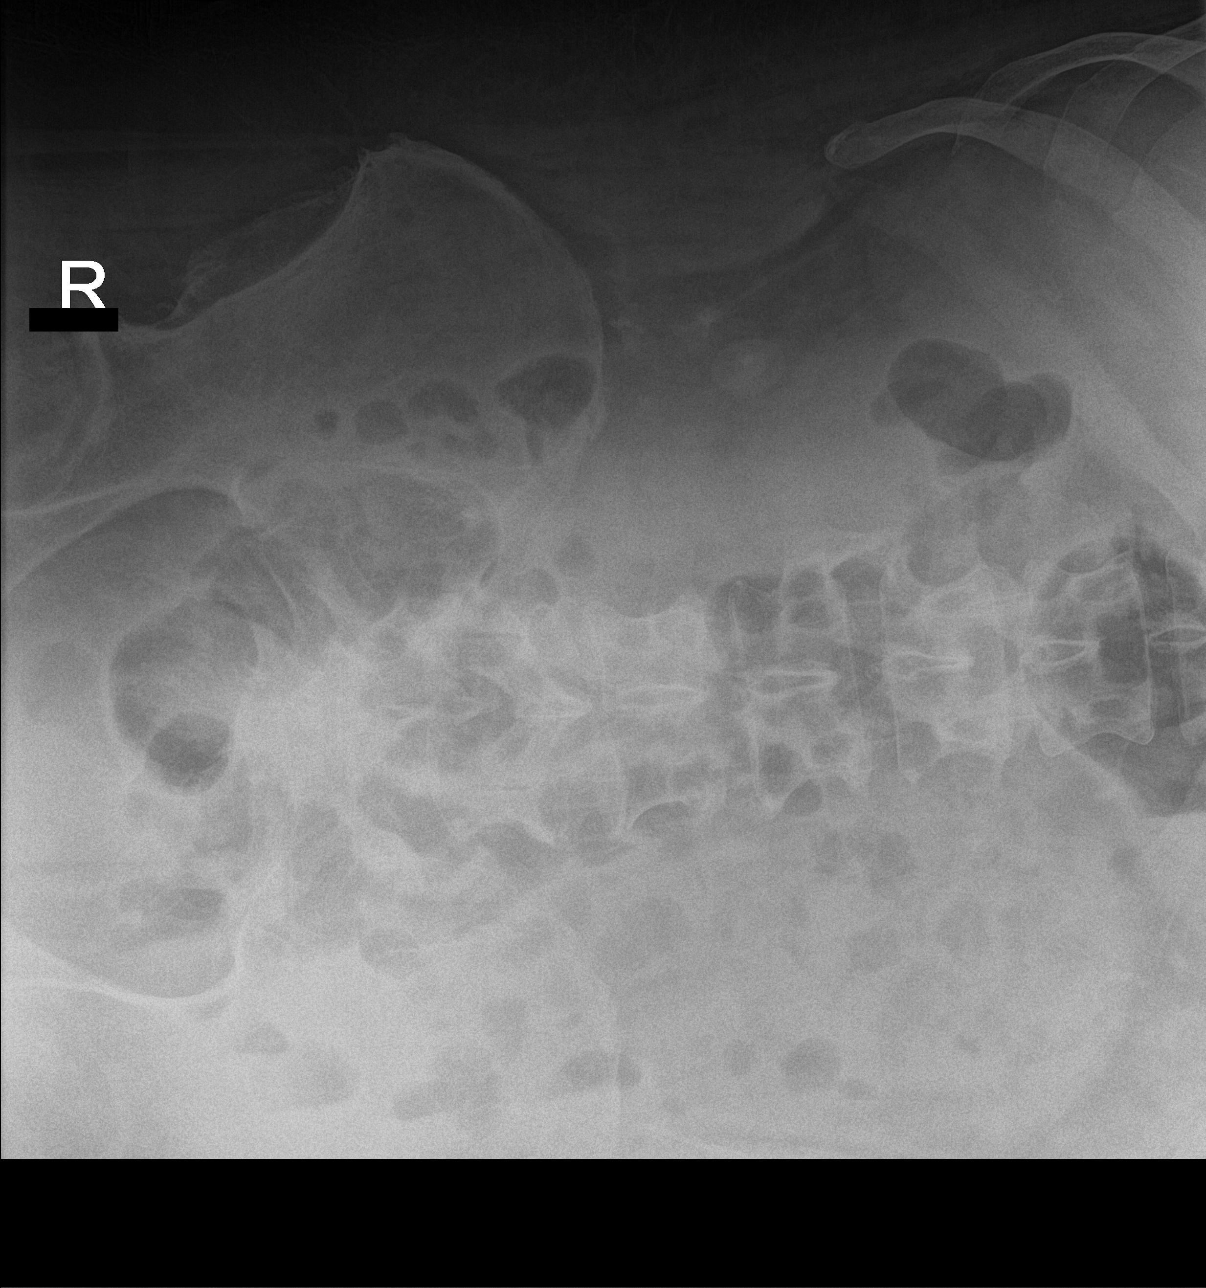

[abdomen erect (2 of 2)]
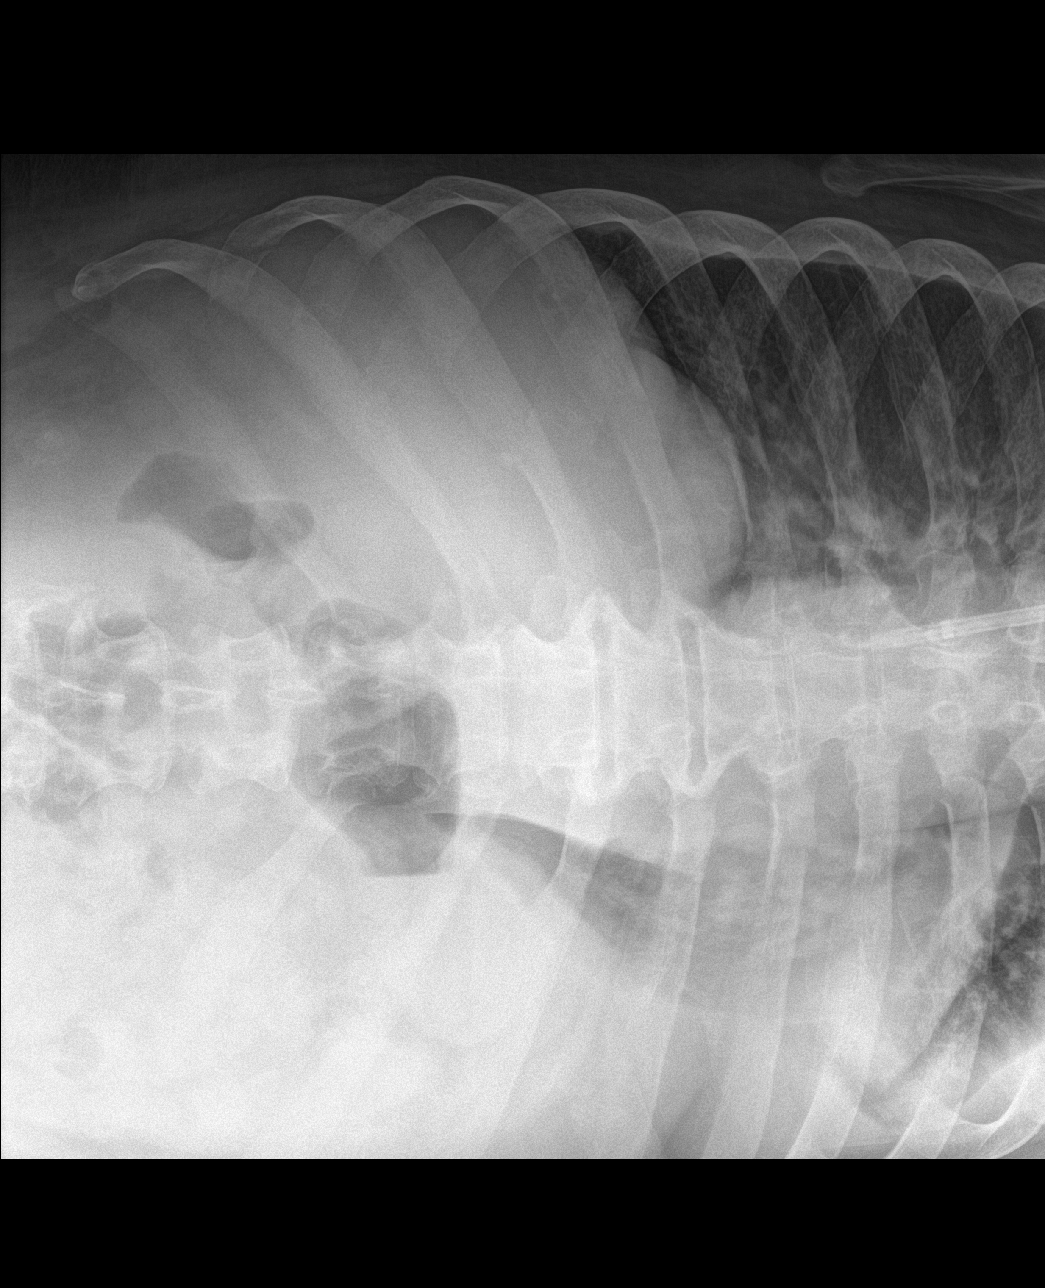

[4 of 4 positions shown; findings below may reference images not displayed]

FINDINGS: Supine and decubitus views of the abdomen show no gaseous bowel
dilatation to suggest obstruction. No non dependent free gas visible
on the decubitus film to suggest perforation. Visualized portions of
the lower lungs appear clear with tiny bilateral pleural effusions.
IMPRESSION: Negative.

## 2019-05-15 SURGERY — SIGMOIDOSCOPY, FLEXIBLE
Anesthesia: Monitor Anesthesia Care

## 2019-05-15 MED ORDER — ONDANSETRON HCL 4 MG/2ML IJ SOLN
INTRAMUSCULAR | Status: DC | PRN
  Administered 2019-05-15: 4 mg via INTRAVENOUS

## 2019-05-15 MED ORDER — SODIUM CHLORIDE (PF) 0.9 % IJ SOLN
PREFILLED_SYRINGE | INTRAMUSCULAR | Status: DC | PRN
  Administered 2019-05-15: 6 mL

## 2019-05-15 MED ORDER — SODIUM CHLORIDE 0.9 % IV SOLN
125.0000 mg | INTRAVENOUS | Status: DC
  Administered 2019-05-17 – 2019-05-22 (×3): 125 mg via INTRAVENOUS
  Filled 2019-05-15 (×7): qty 10

## 2019-05-15 MED ORDER — PROPOFOL 500 MG/50ML IV EMUL
INTRAVENOUS | Status: DC | PRN
  Administered 2019-05-15: 100 ug/kg/min via INTRAVENOUS

## 2019-05-15 MED ORDER — ALBUTEROL SULFATE (2.5 MG/3ML) 0.083% IN NEBU: 2.5000 mg | INHALATION_SOLUTION | Freq: Four times a day (QID) | RESPIRATORY_TRACT | Status: DC | PRN

## 2019-05-15 MED ORDER — SODIUM CHLORIDE 0.9% IV SOLUTION
Freq: Once | INTRAVENOUS | Status: AC
  Administered 2019-05-15: 10:00:00 via INTRAVENOUS

## 2019-05-15 MED ORDER — CHLORHEXIDINE GLUCONATE CLOTH 2 % EX PADS
6.0000 | MEDICATED_PAD | Freq: Every day | CUTANEOUS | Status: DC
  Administered 2019-05-16 – 2019-05-17 (×2): 6 via TOPICAL

## 2019-05-15 MED ORDER — LIDOCAINE VISCOUS HCL 2 % MT SOLN
OROMUCOSAL | Status: DC | PRN
  Administered 2019-05-15: 10 mL

## 2019-05-15 MED ORDER — LIDOCAINE VISCOUS HCL 2 % MT SOLN
OROMUCOSAL | Status: AC
  Filled 2019-05-15: qty 15

## 2019-05-15 MED ORDER — SODIUM THIOSULFATE 25 % IV SOLN
25.0000 g | INTRAVENOUS | Status: DC
  Administered 2019-05-17 – 2019-05-19 (×2): 25 g via INTRAVENOUS
  Filled 2019-05-15 (×5): qty 100

## 2019-05-15 MED ORDER — PHENYLEPHRINE 40 MCG/ML (10ML) SYRINGE FOR IV PUSH (FOR BLOOD PRESSURE SUPPORT)
PREFILLED_SYRINGE | INTRAVENOUS | Status: DC | PRN
  Administered 2019-05-15: 80 ug via INTRAVENOUS
  Administered 2019-05-15: 40 ug via INTRAVENOUS
  Administered 2019-05-15: 80 ug via INTRAVENOUS
  Administered 2019-05-15: 120 ug via INTRAVENOUS
  Administered 2019-05-15: 80 ug via INTRAVENOUS
  Administered 2019-05-15: 120 ug via INTRAVENOUS

## 2019-05-15 MED ORDER — DEXTROSE 50 % IV SOLN
12.5000 g | INTRAVENOUS | Status: AC
  Administered 2019-05-15: 25 g via INTRAVENOUS

## 2019-05-15 MED ORDER — SODIUM CHLORIDE 0.9 % IV SOLN
INTRAVENOUS | Status: DC | PRN
  Administered 2019-05-15 (×2): via INTRAVENOUS

## 2019-05-15 MED ORDER — EPHEDRINE SULFATE-NACL 50-0.9 MG/10ML-% IV SOSY
PREFILLED_SYRINGE | INTRAVENOUS | Status: DC | PRN
  Administered 2019-05-15: 5 mg via INTRAVENOUS

## 2019-05-15 MED ORDER — DEXTROSE 50 % IV SOLN
INTRAVENOUS | Status: AC
  Filled 2019-05-15: qty 50

## 2019-05-15 NOTE — Progress Notes (Signed)
PROGRESS NOTE    Michael Riggs  [name] DOB: 19-Jun-1971 DOA: 05/14/2019 PCP: [name], MD    Brief Narrative:  48 year old male with prior h/o ESRD on HD on MWF SAT, obesity, diet controlled DM, presents with rectal bleeding.    Assessment & Plan:   Principal Problem:   Lower GI bleed Active Problems:   Diabetes mellitus type 2 in obese (HCC)   End stage renal disease ([place])   Controlled type 2 diabetes mellitus with hyperglycemia, without long-term current use of insulin (HCC)   Hypertension   Calciphylaxis   Pressure injury of skin   Rectal bleeding secondary to rectal ulcers. S/p flex sigmoidoscopy by gastroenterology on 10/26/201 where rectal ulcer was found it was successfully injected with epinephrine for hemostasis along with 5 hemostatatic clips were placed. Hemoglobin less than 7 earlier this morning and 2 units of PRBC transfusions ordered.  Repeat H&H tonight.  Liquid diet today. Plan for follow-up colonoscopy in the future. Empiric antibiotics with Rocephin and Flagyl for proctitis  End-stage renal disease on dialysis Monday Wednesday Friday and Saturday. Nephrology consulted.    Diet-controlled diabetes mellitus Continue with sliding scale insulin while hospitalized.    Essential hypertension Well-controlled   GERD Stable on Protonix   DVT prophylaxis: scd's. Code Status: full code Family Communication: none at bedside.  Disposition Plan: pending clinical improvement.    Consultants:   Gastroenterology  Nephrology.   Procedures: sigmoidoscopy.   Antimicrobials: flagyl and rocephin for proctitis.    Subjective:  Pain is controlled. No bleeding post sigmoidoscopy. No nausea ,or vomiting.  Objective: Vitals:   05/15/19 1500 05/15/19 1530 05/15/19 1600 05/15/19 1612  BP: 106/63 103/88 116/67 114/77  Pulse: 84 70 84 84  Resp: 15 15 15 16   Temp:    (!) 97.5 F (36.4 C)  TempSrc:    Oral  SpO2: 92% (!) 88% 96% 95%   Weight:      Height:        Intake/Output Summary (Last 24 hours) at 05/15/2019 1739 Last data filed at 05/15/2019 1313 Gross per 24 hour  Intake 500 ml  Output 100 ml  Net 400 ml   Filed Weights   05/14/19 1308 05/15/19 1121  Weight: 107.5 kg 105.6 kg    Examination:  General exam: Ill appearing. Respiratory system: Clear to auscultation. Respiratory effort normal. Cardiovascular system: S1 & S2 heard, RRR.   Gastrointestinal system: Abdomen is soft NT nd bs+ Central nervous system: Alert and oriented. Grossly non focal.  Extremities: left BKA.  Skin: stage 2 sacral decubitus ulcer.  Psychiatry: Mood & affect appropriate.     Data Reviewed: I have personally reviewed following labs and imaging studies  CBC: Recent Labs  Lab 05/14/19 1323 05/14/19 1801 05/15/19 0406 05/15/19 1227  WBC 12.4* 13.3* 9.4  --   NEUTROABS 10.0*  --   --   --   HGB 9.2* 8.4* 6.5* 7.8*  HCT 30.3* 27.9* 21.9* 23.0*  MCV 92.1 92.1 93.6  --   PLT 317 351 295  --    Basic Metabolic Panel: Recent Labs  Lab 05/14/19 1323 05/15/19 0406 05/15/19 1227  NA 138  --  138  K 4.7  --  4.3  CL 103  --  104  CO2 24  --   --   GLUCOSE 116* 73 79  BUN 35*  --  46*  CREATININE 6.04*  --  7.60*  CALCIUM 8.3*  --   --  GFR: Estimated Creatinine Clearance: 14.6 mL/min (A) (by C-G formula based on SCr of 7.6 mg/dL (H)). Liver Function Tests: Recent Labs  Lab 05/14/19 1323  AST 26  ALT 12  ALKPHOS 72  BILITOT 1.1  PROT 7.1  ALBUMIN 2.0*   Recent Labs  Lab 05/14/19 1323  LIPASE 40   No results for input(s): AMMONIA in the last 168 hours. Coagulation Profile: No results for input(s): INR, PROTIME in the last 168 hours. Cardiac Enzymes: No results for input(s): CKTOTAL, CKMB, CKMBINDEX, TROPONINI in the last 168 hours. BNP (last 3 results) No results for input(s): PROBNP in the last 8760 hours. HbA1C: No results for input(s): HGBA1C in the last 72 hours. CBG: Recent Labs  Lab  05/15/19 1142 05/15/19 1238 05/15/19 1604  GLUCAP 66* 127* 74   Lipid Profile: No results for input(s): CHOL, HDL, LDLCALC, TRIG, CHOLHDL, LDLDIRECT in the last 72 hours. Thyroid Function Tests: No results for input(s): TSH, T4TOTAL, FREET4, T3FREE, THYROIDAB in the last 72 hours. Anemia Panel: No results for input(s): VITAMINB12, FOLATE, FERRITIN, TIBC, IRON, RETICCTPCT in the last 72 hours. Sepsis Labs: Recent Labs  Lab 05/14/19 1801 05/14/19 2240  LATICACIDVEN 2.1* 1.1    Recent Results (from the past 240 hour(s))  SARS Coronavirus 2 by RT PCR (hospital order, performed in [facility] hospital lab) Nasopharyngeal Nasopharyngeal Swab     Status: None   Collection Time: 05/14/19  5:42 PM   Specimen: Nasopharyngeal Swab  Result Value Ref Range Status   SARS Coronavirus 2 NEGATIVE NEGATIVE Final    Comment: (NOTE) If result is NEGATIVE SARS-CoV-2 target nucleic acids are NOT DETECTED. The SARS-CoV-2 RNA is generally detectable in upper and lower  respiratory specimens during the acute phase of infection. The lowest  concentration of SARS-CoV-2 viral copies this assay can detect is 250  copies / mL. A negative result does not preclude SARS-CoV-2 infection  and should not be used as the sole basis for treatment or other  patient management decisions.  A negative result may occur with  improper specimen collection / handling, submission of specimen other  than nasopharyngeal swab, presence of viral mutation(s) within the  areas targeted by this assay, and inadequate number of viral copies  (<250 copies / mL). A negative result must be combined with clinical  observations, patient history, and epidemiological information. If result is POSITIVE SARS-CoV-2 target nucleic acids are DETECTED. The SARS-CoV-2 RNA is generally detectable in upper and lower  respiratory specimens dur ing the acute phase of infection.  Positive  results are indicative of active infection with  SARS-CoV-2.  Clinical  correlation with patient history and other diagnostic information is  necessary to determine patient infection status.  Positive results do  not rule out bacterial infection or co-infection with other viruses. If result is PRESUMPTIVE POSTIVE SARS-CoV-2 nucleic acids MAY BE PRESENT.   A presumptive positive result was obtained on the submitted specimen  and confirmed on repeat testing.  While 2019 novel coronavirus  (SARS-CoV-2) nucleic acids may be present in the submitted sample  additional confirmatory testing may be necessary for epidemiological  and / or clinical management purposes  to differentiate between  SARS-CoV-2 and other Sarbecovirus currently known to infect humans.  If clinically indicated additional testing with an alternate test  methodology [phone]) is advised. The SARS-CoV-2 RNA is generally  detectable in upper and lower respiratory sp ecimens during the acute  phase of infection. The expected result is Negative. Fact Sheet for Patients:  StrictlyIdeas.no Fact Sheet for Healthcare Providers: BankingDealers.co.za This test is not yet approved or cleared by the [place] FDA and has been authorized for detection and/or diagnosis of SARS-CoV-2 by FDA under an Emergency Use Authorization (EUA).  This EUA will remain in effect (meaning this test can be used) for the duration of the COVID-19 declaration under Section 564(b)(1) of the Act, 21 U.S.C. section 360bbb-3(b)(1), unless the authorization is terminated or revoked sooner. Performed at [facility], [place] [address] [place], [place] [address]          Radiology Studies: Ct Abdomen Pelvis Wo Contrast  Result Date: 05/14/2019 CLINICAL DATA:  Rectal bleeding this morning. Constipation. Abdominal distension. EXAM: CT ABDOMEN AND PELVIS WITHOUT CONTRAST TECHNIQUE: Multidetector CT imaging of the abdomen and pelvis was performed  following the standard protocol without IV contrast. COMPARISON:  04/27/2019 abdominal radiograph. 02/11/2019 CT abdomen/pelvis. FINDINGS: Lower chest: Small to moderate bilateral dependent pleural effusions with associated dependent bibasilar atelectasis. Coronary atherosclerosis. Trace pericardial effusion/thickening. Hepatobiliary: Normal liver size. No liver mass. Cholelithiasis. No significant gallbladder distention. No gallbladder wall thickening. No pericholecystic fluid. No biliary ductal dilatation. Pancreas: Normal, with no mass or duct dilation. Spleen: Normal size. No mass. Adrenals/Urinary Tract: Normal adrenals. No hydronephrosis. No renal stones. No contour deforming renal masses. Nondistended bladder with questionable diffuse bladder wall thickening. Stomach/Bowel: Normal non-distended stomach. Normal caliber small bowel with no small bowel wall thickening. Normal appendix. Rectum moderately distended by stool. Mild circumferential rectal wall thickening is new with mild haziness of perirectal fat. Otherwise unremarkable large bowel with no diverticulosis. Vascular/Lymphatic: Atherosclerotic nonaneurysmal abdominal aorta. Mild bilateral external iliac lymphadenopathy measuring up to 1.0 cm on the left (series 8/image 86) and 1.2 cm on the right (series 8/image 85), stable since 02/11/2019 CT. No new pathologically enlarged abdominopelvic nodes. Reproductive: Atrophic appearing prostate. Other: No pneumoperitoneum, ascites or focal fluid collection. Musculoskeletal: No aggressive appearing focal osseous lesions. Moderate thoracolumbar spondylosis. IMPRESSION: 1. Rectum moderately distended by stool. New mild circumferential rectal wall thickening with mild haziness of perirectal fat, cannot exclude stercoral colitis or other nonspecific proctitis. 2. Small to moderate bilateral dependent pleural effusions with associated dependent bibasilar atelectasis. Trace pericardial effusion/thickening. 3.  Cholelithiasis. 4. Stable mild bilateral external iliac lymphadenopathy, nonspecific, presumably reactive. 5. Coronary atherosclerosis. 6. Aortic Atherosclerosis (ICD10-I70.0). Electronically Signed   By: Ilona Sorrel M.D.   On: 05/14/2019 17:11   Dg Abd 2 Views  Result Date: 05/15/2019 CLINICAL DATA:  Lower GI bleed. EXAM: ABDOMEN - 2 VIEW COMPARISON:  CT scan 05/14/2019 FINDINGS: Supine and decubitus views of the abdomen show no gaseous bowel dilatation to suggest obstruction. No non dependent free gas visible on the decubitus film to suggest perforation. Visualized portions of the lower lungs appear clear with tiny bilateral pleural effusions. IMPRESSION: Negative. Electronically Signed   By: Misty Stanley M.D.   On: 05/15/2019 10:39        Scheduled Meds: . sodium chloride   Intravenous Once  . bethanechol  25 mg Oral TID  . Chlorhexidine Gluconate Cloth  6 each Topical Q0600  . cholestyramine  8 g Oral BID  . DULoxetine  40 mg Oral Daily  . Melatonin  4.5 mg Oral QHS  . midodrine  10 mg Oral Q M,W,F  . pantoprazole  40 mg Oral Daily  . tamsulosin  0.4 mg Oral QPC breakfast   Continuous Infusions: . cefTRIAXone (ROCEPHIN)  IV    . ferric gluconate (FERRLECIT/NULECIT) IV    .  lactated ringers 100 mL/hr at 05/14/19 2327  . metronidazole 500 mg (05/15/19 1636)  . sodium thiosulfate infusion for calciphylaxis       LOS: 1 day      Hosie Poisson, MD [facility] Pager [phone]  If 7PM-7AM, please contact night-coverage [email or web address] Password [facility] 05/15/2019, 5:39 PM

## 2019-05-15 NOTE — Consult Note (Addendum)
[facility] Renal Consultation Note    Indication for Consultation:  Management of ESRD/hemodialysis; anemia, hypertension/volume and secondary hyperparathyroidism  PCP:Lee, Joylene Igo, MD  HPI: Michael Riggs is a 48 y.o. male. ESRD on HD MWFS at [facility], first starting in July 2020.  Past medical history significant for DM, HTN, PAD s/p left BKA, latent syphills, Hx bladder outlet obstruction, and calciphylaxis. Recent admit from 03/28/2019-04/27/2019 due to wound infection/calciphylaxis, and LOC requiring CPR followed by intubation.   Of note patient has not been compliant with dialysis schedule since discharge.  He missed 2 of his 4 treatments in the last week.  When he comes he completes his full treatment and typically gains between 1.5-2.5L. Hemoglobin has ranged from 9.5-10 since discharge, most recently 9.7 on 10/23.  Patient presented to the ED due to bloody BM.  Denies SOB, edema, abdominal pain, n/v/d.  No history GI bleed.  Reports pain in LE from wounds.  Pertinent findings include multiple bloody BM overnight in the ED, Hgb drop 9.2>8.4>6.5, hypotension, CT abd showing moderate rectal stool burden, with "new mild circumferential rectal wall thickening with mild haziness of perirectal fat, cannot exclude stercoral colitis or other nonspecific proctitis."  Patient has been admitted for further evaluation and management.    Past Medical History:  Diagnosis Date  . Anemia   . Depression   . Diabetes mellitus with peripheral vascular disease ([place])   . Dyslipidemia   . Dyspnea   . ESRD (end stage renal disease) on dialysis ([place]) 01/2019   MWF  . GERD (gastroesophageal reflux disease)   . Hypertension   . Physical deconditioning   . Syphilis 01/2019   Past Surgical History:  Procedure Laterality Date  . AV FISTULA PLACEMENT Left 01/09/2019   Procedure: ARTERIOVENOUS (AV) FISTULA CREATION LEFT UPPER ARM;  Surgeon: [name], MD;  Location: [facility] OR;   Service: Vascular;  Laterality: Left;  . below the knee amputation Left   . EYE SURGERY    . IR FLUORO GUIDE CV LINE RIGHT  01/31/2019  . IR FLUORO GUIDE CV LINE RIGHT  02/03/2019  . IR US GUIDE VASC ACCESS RIGHT  01/31/2019  . IR US GUIDE VASC ACCESS RIGHT  02/03/2019   History reviewed. No pertinent family history. Social History:  reports that he quit smoking about 3 months ago. His smoking use included cigarettes. He started smoking about 5 months ago. He has never used smokeless tobacco. He reports previous alcohol use. He reports previous drug use. No Known Allergies Prior to Admission medications   Medication Sig Start Date End Date Taking? Authorizing Provider  acetaminophen (TYLENOL) 325 MG tablet Take 2 tablets (650 mg total) by mouth every 6 (six) hours as needed for mild pain (or Fever >/= 101). 03/13/19  Yes Angiulli, Lavon Paganini, PA-C  albuterol (VENTOLIN HFA) 108 (90 Base) MCG/ACT inhaler Inhale 2 puffs into the lungs every 6 (six) hours as needed for wheezing or shortness of breath.   Yes [provider]  amLODipine (NORVASC) 5 MG tablet Take 1 tablet (5 mg total) by mouth at bedtime. 04/27/19  Yes Dessa Phi, DO  atorvastatin (LIPITOR) 40 MG tablet Take 1 tablet (40 mg total) by mouth daily. 03/13/19  Yes Angiulli, Lavon Paganini, PA-C  b complex-vitamin c-folic acid (NEPHRO-VITE) 0.8 MG TABS tablet Take 1 tablet by mouth daily.   Yes [provider]  bethanechol (URECHOLINE) 25 MG tablet Take 1 tablet (25 mg total) by mouth 3 (three) times daily. 03/13/19  Yes Angiulli, Lavon Paganini, PA-C  camphor-menthol [facility]) lotion Apply 1 application topically as needed for itching.   Yes [provider]  cholestyramine (QUESTRAN) 4 g packet Take 2 packets (8 g total) by mouth 2 (two) times daily. 04/27/19  Yes Dessa Phi, DO  collagenase (SANTYL) ointment Apply 1 application topically daily. Apply to the affected area daily plus dry dressing 05/09/19  Yes [name], MD   hydrocortisone (ANUSOL-HC) 25 MG suppository Place 1 suppository (25 mg total) rectally 2 (two) times daily as needed for hemorrhoids or anal itching. 04/27/19  Yes Dessa Phi, DO  ipratropium-albuterol (DUONEB) 0.5-2.5 (3) MG/3ML SOLN Take 3 mLs by nebulization every 6 (six) hours as needed (For shortness of breath).   Yes [provider]  Lidocaine-Prilocaine &Lido HCl 2.5-2.5 & 3.88 % KIT Apply 1 application topically every Monday, Wednesday, and Friday. Apply to fistula and wrap with saran wrap site prior to dialysys   Yes [provider]  Loperamide HCl 1 MG/7.5ML LIQD Take 30 mLs by mouth daily as needed (For loose stools).   Yes [provider]  Melatonin 3 MG TABS Take 1.5 tablets (4.5 mg total) by mouth at bedtime. 02/08/19  Yes Alma Friendly, MD  midodrine (PROAMATINE) 10 MG tablet Take 1 tablet (10 mg total) by mouth every Monday, Wednesday, and Friday. 04/28/19 05/28/19 Yes Dessa Phi, DO  nystatin (NYSTATIN) powder Apply 1 Bottle topically 3 (three) times daily.   Yes [provider]  oxyCODONE (OXY IR/ROXICODONE) 5 MG immediate release tablet Take 1 tablet (5 mg total) by mouth every 6 (six) hours as needed for severe pain or breakthrough pain. 04/27/19  Yes Dessa Phi, DO  pantoprazole (PROTONIX) 40 MG tablet Take 1 tablet (40 mg total) by mouth daily. 03/13/19  Yes Angiulli, Lavon Paganini, PA-C  polyethylene glycol (MIRALAX / GLYCOLAX) 17 g packet Take 17 g by mouth daily. Patient taking differently: Take 17 g by mouth daily as needed for mild constipation or moderate constipation. 4-8 ounces 02/09/19  Yes Alma Friendly, MD  silver sulfADIAZINE (SILVADENE) 1 % cream Apply 1 application topically daily.   Yes [provider]  tamsulosin (FLOMAX) 0.4 MG CAPS capsule Take 1 capsule (0.4 mg total) by mouth daily after breakfast. 03/13/19  Yes Angiulli, Lavon Paganini, PA-C  traMADol (ULTRAM) 50 MG tablet Take 1 tablet (50 mg total) by  mouth every 6 (six) hours as needed for moderate pain. 04/27/19  Yes Dessa Phi, DO  DULoxetine HCl 40 MG CPEP Take 40 mg by mouth daily. 04/27/19   Dessa Phi, DO  Hydrocortisone (GERHARDT'S BUTT CREAM) CREA Apply 1 application topically 3 (three) times daily as needed for irritation. 04/27/19   Dessa Phi, DO   Current Facility-Administered Medications  Medication Dose Route Frequency Provider Last Rate Last Dose  . 0.9 %  sodium chloride infusion (Manually program via Guardrails IV Fluids)   Intravenous Once Gwynne Edinger, MD   Stopped at 05/14/19 2208  . 0.9 %  sodium chloride infusion (Manually program via Guardrails IV Fluids)   Intravenous Once Hosie Poisson, MD      . albuterol (VENTOLIN HFA) 108 (90 Base) MCG/ACT inhaler 2 puff  2 puff Inhalation Q6H PRN Wouk, Ailene Rud, MD      . bethanechol (URECHOLINE) tablet 25 mg  25 mg Oral TID Gwynne Edinger, MD   25 mg at 05/15/19 0247  . cefTRIAXone (ROCEPHIN) 2 g in sodium chloride 0.9 % 100 mL IVPB  2 g Intravenous Daily Wouk, Ailene Rud, MD      . Chlorhexidine Gluconate Cloth 2 % PADS 6 each  6 each Topical Q0600 Penninger, Ria Comment, [place]      . cholestyramine (QUESTRAN) packet 8 g  8 g Oral BID Gwynne Edinger, MD   8 g at 05/15/19 0234  . DULoxetine (CYMBALTA) DR capsule 40 mg  40 mg Oral Daily Wouk, Ailene Rud, MD      . lactated ringers infusion   Intravenous Continuous Gwynne Edinger, MD 100 mL/hr at 05/14/19 2327    . Melatonin TABS 4.5 mg  4.5 mg Oral QHS Wouk, Ailene Rud, MD   4.5 mg at 05/15/19 0247  . metroNIDAZOLE (FLAGYL) IVPB 500 mg  500 mg Intravenous Q8H Wouk, Ailene Rud, MD   Stopped at 05/14/19 2324  . midodrine (PROAMATINE) tablet 10 mg  10 mg Oral Q M,W,F Wouk, Ailene Rud, MD      . oxyCODONE (Oxy IR/ROXICODONE) immediate release tablet 5 mg  5 mg Oral Q6H PRN Gwynne Edinger, MD   5 mg at 05/15/19 0525  . pantoprazole (PROTONIX) EC tablet 40 mg  40 mg Oral Daily Wouk, Ailene Rud, MD       . tamsulosin [facility]) capsule 0.4 mg  0.4 mg Oral QPC breakfast Wouk, Ailene Rud, MD       Current Outpatient Medications  Medication Sig Dispense Refill  . acetaminophen (TYLENOL) 325 MG tablet Take 2 tablets (650 mg total) by mouth every 6 (six) hours as needed for mild pain (or Fever >/= 101).    Marland Kitchen albuterol (VENTOLIN HFA) 108 (90 Base) MCG/ACT inhaler Inhale 2 puffs into the lungs every 6 (six) hours as needed for wheezing or shortness of breath.    Marland Kitchen amLODipine (NORVASC) 5 MG tablet Take 1 tablet (5 mg total) by mouth at bedtime. 30 tablet 0  . atorvastatin (LIPITOR) 40 MG tablet Take 1 tablet (40 mg total) by mouth daily.    Marland Kitchen b complex-vitamin c-folic acid (NEPHRO-VITE) 0.8 MG TABS tablet Take 1 tablet by mouth daily.    . bethanechol (URECHOLINE) 25 MG tablet Take 1 tablet (25 mg total) by mouth 3 (three) times daily.    . camphor-menthol (SARNA) lotion Apply 1 application topically as needed for itching.    . cholestyramine (QUESTRAN) 4 g packet Take 2 packets (8 g total) by mouth 2 (two) times daily. 60 each 0  . collagenase (SANTYL) ointment Apply 1 application topically daily. Apply to the affected area daily plus dry dressing 90 g 3  . hydrocortisone (ANUSOL-HC) 25 MG suppository Place 1 suppository (25 mg total) rectally 2 (two) times daily as needed for hemorrhoids or anal itching. 12 suppository 0  . ipratropium-albuterol (DUONEB) 0.5-2.5 (3) MG/3ML SOLN Take 3 mLs by nebulization every 6 (six) hours as needed (For shortness of breath).    . Lidocaine-Prilocaine &Lido HCl 2.5-2.5 & 3.88 % KIT Apply 1 application topically every Monday, Wednesday, and Friday. Apply to fistula and wrap with saran wrap site prior to dialysys    . Loperamide HCl 1 MG/7.5ML LIQD Take 30 mLs by mouth daily as needed (For loose stools).    . Melatonin 3 MG TABS Take 1.5 tablets (4.5 mg total) by mouth at bedtime.  0  . midodrine (PROAMATINE) 10 MG tablet Take 1 tablet (10 mg total) by mouth every  Monday, Wednesday, and Friday. 12 tablet 0  . nystatin (NYSTATIN) powder Apply 1 Bottle topically 3 (three) times  daily.    . oxyCODONE (OXY IR/ROXICODONE) 5 MG immediate release tablet Take 1 tablet (5 mg total) by mouth every 6 (six) hours as needed for severe pain or breakthrough pain. 28 tablet 0  . pantoprazole (PROTONIX) 40 MG tablet Take 1 tablet (40 mg total) by mouth daily.    . polyethylene glycol (MIRALAX / GLYCOLAX) 17 g packet Take 17 g by mouth daily. (Patient taking differently: Take 17 g by mouth daily as needed for mild constipation or moderate constipation. 4-8 ounces) 14 each 0  . silver sulfADIAZINE (SILVADENE) 1 % cream Apply 1 application topically daily.    . tamsulosin (FLOMAX) 0.4 MG CAPS capsule Take 1 capsule (0.4 mg total) by mouth daily after breakfast. 30 capsule   . traMADol (ULTRAM) 50 MG tablet Take 1 tablet (50 mg total) by mouth every 6 (six) hours as needed for moderate pain. 30 tablet 0  . DULoxetine HCl 40 MG CPEP Take 40 mg by mouth daily. 30 capsule 0  . Hydrocortisone (GERHARDT'S BUTT CREAM) CREA Apply 1 application topically 3 (three) times daily as needed for irritation. 1 each 0   Labs: Basic Metabolic Panel: Recent Labs  Lab 05/14/19 1323 05/15/19 0406  NA 138  --   K 4.7  --   CL 103  --   CO2 24  --   GLUCOSE 116* 73  BUN 35*  --   CREATININE 6.04*  --   CALCIUM 8.3*  --    Liver Function Tests: Recent Labs  Lab 05/14/19 1323  AST 26  ALT 12  ALKPHOS 72  BILITOT 1.1  PROT 7.1  ALBUMIN 2.0*   Recent Labs  Lab 05/14/19 1323  LIPASE 40   CBC: Recent Labs  Lab 05/14/19 1323 05/14/19 1801 05/15/19 0406  WBC 12.4* 13.3* 9.4  NEUTROABS 10.0*  --   --   HGB 9.2* 8.4* 6.5*  HCT 30.3* 27.9* 21.9*  MCV 92.1 92.1 93.6  PLT 317 351 295   Studies/Results: Ct Abdomen Pelvis Wo Contrast  Result Date: 05/14/2019 CLINICAL DATA:  Rectal bleeding this morning. Constipation. Abdominal distension. EXAM: CT ABDOMEN AND PELVIS WITHOUT  CONTRAST TECHNIQUE: Multidetector CT imaging of the abdomen and pelvis was performed following the standard protocol without IV contrast. COMPARISON:  04/27/2019 abdominal radiograph. 02/11/2019 CT abdomen/pelvis. FINDINGS: Lower chest: Small to moderate bilateral dependent pleural effusions with associated dependent bibasilar atelectasis. Coronary atherosclerosis. Trace pericardial effusion/thickening. Hepatobiliary: Normal liver size. No liver mass. Cholelithiasis. No significant gallbladder distention. No gallbladder wall thickening. No pericholecystic fluid. No biliary ductal dilatation. Pancreas: Normal, with no mass or duct dilation. Spleen: Normal size. No mass. Adrenals/Urinary Tract: Normal adrenals. No hydronephrosis. No renal stones. No contour deforming renal masses. Nondistended bladder with questionable diffuse bladder wall thickening. Stomach/Bowel: Normal non-distended stomach. Normal caliber small bowel with no small bowel wall thickening. Normal appendix. Rectum moderately distended by stool. Mild circumferential rectal wall thickening is new with mild haziness of perirectal fat. Otherwise unremarkable large bowel with no diverticulosis. Vascular/Lymphatic: Atherosclerotic nonaneurysmal abdominal aorta. Mild bilateral external iliac lymphadenopathy measuring up to 1.0 cm on the left (series 8/image 86) and 1.2 cm on the right (series 8/image 85), stable since 02/11/2019 CT. No new pathologically enlarged abdominopelvic nodes. Reproductive: Atrophic appearing prostate. Other: No pneumoperitoneum, ascites or focal fluid collection. Musculoskeletal: No aggressive appearing focal osseous lesions. Moderate thoracolumbar spondylosis. IMPRESSION: 1. Rectum moderately distended by stool. New mild circumferential rectal wall thickening with mild haziness of perirectal fat, cannot exclude stercoral  colitis or other nonspecific proctitis. 2. Small to moderate bilateral dependent pleural effusions with  associated dependent bibasilar atelectasis. Trace pericardial effusion/thickening. 3. Cholelithiasis. 4. Stable mild bilateral external iliac lymphadenopathy, nonspecific, presumably reactive. 5. Coronary atherosclerosis. 6. Aortic Atherosclerosis (ICD10-I70.0). Electronically Signed   By: Ilona Sorrel M.D.   On: 05/14/2019 17:11    ROS: All others negative except those listed in HPI.  Physical Exam: Vitals:   05/15/19 0330 05/15/19 0400 05/15/19 0430 05/15/19 0800  BP: 114/77 111/78 (!) 116/94 107/66  Pulse: 82 82 85 84  Resp: 11 (!) _0 Temp:      TempSrc:      SpO2: 100% 100% 91% 96%  Weight:      Height:         General: NAD, pale, chronically ill appearing male, laying in bed Head: NCAT sclera not icteric, slightly injected. MMM Neck: Supple. No lymphadenopathy Lungs: CTA bilaterally. No wheeze, rales or rhonchi. Breathing is unlabored. Heart: RRR. No murmur, rubs or gallops.  Abdomen: soft, nontender, +BS, no guarding, no rebound tenderness Lower extremities:no edema, L BKA, multiple calciphylaxis wounds on b/l thighs, R calf in ace wrap. Neuro: AAOx3. Moves all extremities spontaneously. Psych:  Responds to questions appropriately with a normal affect. Dialysis Access: R IJ TDC, LU AVF maturing  Dialysis Orders:  MWFS - [place]  4hrs, BFR 400, DFR 800,  EDW 107kg, 2K/ 2.25Ca (should be 2K/2.0Ca bath)  Access: R IJ TDC, LU AVF maturing  Heparin 3000 units Mircera 100 mcg q2wks - last 10/16 Venofer 190m x10 - 2 of 10 completed Sodium thiosulfate 225mqHD  Assessment/Plan: 1.  Hematochezia - multiple bloody BM in ED.  HGB drop 9.2>8.4>6.5. 2units pBRC ordered, can be given with dialysis or prior. Per primary 2.  ESRD -  On HD MWFS. Orders written for HD today per regular schedule.  3.  Hypertension/volume  - BP soft. Does not appear volume overloaded. Run even on HD today.  4.  Anemia of CKD - Continue iron load. Due for ESA on 10/30.  5.  Secondary  Hyperparathyroidism -  Ca at goal. Will check phos.  NO calcium/Vit D because of calciphylaxis.  6.  Nutrition - Renal diet w/fluid restriction when no longer NPO 7. Calciphylaxis - cont Na thiosulfate, low Ca bath on HD (important), avoid all Ca/ vit D products 8. DM - per primary  LiJen MowPA-C [facility] Pager: [phone]/26/2020, 8:58 AM   Pt seen, examined and agree w assess/plan as above with additions as indicated.  [facility] 05/15/2019, 10:40 AM

## 2019-05-15 NOTE — Progress Notes (Signed)
Eilene Ghazi, RN called and notified that the HD treatment has been moved to 05/15/19.

## 2019-05-15 NOTE — Anesthesia Postprocedure Evaluation (Signed)
Anesthesia Post Note  Patient: Michael Riggs  Procedure(s) Performed: FLEXIBLE SIGMOIDOSCOPY (N/A ) HOT HEMOSTASIS (ARGON PLASMA COAGULATION/BICAP) (N/A ) HEMOSTASIS CLIP PLACEMENT SCLEROTHERAPY     Patient location during evaluation: Endoscopy Anesthesia Type: MAC Level of consciousness: awake Pain management: pain level controlled Vital Signs Assessment: post-procedure vital signs reviewed and stable Respiratory status: spontaneous breathing Cardiovascular status: stable Postop Assessment: no apparent nausea or vomiting Anesthetic complications: no    Last Vitals:  Vitals:   05/15/19 1600 05/15/19 1612  BP: 116/67 114/77  Pulse: 84 84  Resp: 15 16  Temp:  (!) 36.4 C  SpO2: 96% 95%    Last Pain:  Vitals:   05/15/19 1612  TempSrc: Oral  PainSc:                  Brunetta Newingham

## 2019-05-15 NOTE — ED Notes (Addendum)
Full liquids diet ordered

## 2019-05-15 NOTE — H&P (Signed)
GASTROENTEROLOGY PROCEDURE H&P NOTE   Primary Care Physician: [name], MD  HPI: Michael Riggs is a 48 y.o. male who presents for Flex Sigmoidoscopy.  Past Medical History:  Diagnosis Date  . Anemia   . Depression   . Diabetes mellitus with peripheral vascular disease ([place])   . Dyslipidemia   . Dyspnea   . ESRD (end stage renal disease) on dialysis ([place]) 01/2019   MWF  . GERD (gastroesophageal reflux disease)   . Hypertension   . Physical deconditioning   . Syphilis 01/2019   Past Surgical History:  Procedure Laterality Date  . AV FISTULA PLACEMENT Left 01/09/2019   Procedure: ARTERIOVENOUS (AV) FISTULA CREATION LEFT UPPER ARM;  Surgeon: [name], MD;  Location: [facility] OR;  Service: Vascular;  Laterality: Left;  . below the knee amputation Left   . EYE SURGERY    . IR FLUORO GUIDE CV LINE RIGHT  01/31/2019  . IR FLUORO GUIDE CV LINE RIGHT  02/03/2019  . IR US GUIDE VASC ACCESS RIGHT  01/31/2019  . IR US GUIDE VASC ACCESS RIGHT  02/03/2019   Current Facility-Administered Medications  Medication Dose Route Frequency Provider Last Rate Last Dose  . [MAR Hold] 0.9 %  sodium chloride infusion (Manually program via Guardrails IV Fluids)   Intravenous Once Gwynne Edinger, MD   Stopped at 05/14/19 2208  . [MAR Hold] albuterol (VENTOLIN HFA) 108 (90 Base) MCG/ACT inhaler 2 puff  2 puff Inhalation Q6H PRN Wouk, Ailene Rud, MD      . Doug Sou Hold] bethanechol (URECHOLINE) tablet 25 mg  25 mg Oral TID Gwynne Edinger, MD   25 mg at 05/15/19 0247  . [MAR Hold] cefTRIAXone (ROCEPHIN) 2 g in sodium chloride 0.9 % 100 mL IVPB  2 g Intravenous Daily Wouk, Ailene Rud, MD      . Doug Sou Hold] Chlorhexidine Gluconate Cloth 2 % PADS 6 each  6 each Topical Q0600 Penninger, [place], [place]      . Doug Sou Hold] cholestyramine (QUESTRAN) packet 8 g  8 g Oral BID Gwynne Edinger, MD   8 g at 05/15/19 0234  . [MAR Hold] DULoxetine (CYMBALTA) DR capsule 40 mg  40 mg Oral Daily Wouk, Ailene Rud, MD      . Doug Sou Hold] ferric gluconate (NULECIT) 125 mg in sodium chloride 0.9 % 100 mL IVPB  125 mg Intravenous Q M,W,F-HD Penninger, Ria Comment, [place]      . lactated ringers infusion   Intravenous Continuous Gwynne Edinger, MD 100 mL/hr at 05/14/19 2327    . [MAR Hold] Melatonin TABS 4.5 mg  4.5 mg Oral QHS Wouk, Ailene Rud, MD   4.5 mg at 05/15/19 0247  . [MAR Hold] metroNIDAZOLE (FLAGYL) IVPB 500 mg  500 mg Intravenous Q8H Wouk, Ailene Rud, MD   Stopped at 05/14/19 2324  . [MAR Hold] midodrine (PROAMATINE) tablet 10 mg  10 mg Oral Q M,W,F Wouk, Ailene Rud, MD      . Doug Sou Hold] oxyCODONE (Oxy IR/ROXICODONE) immediate release tablet 5 mg  5 mg Oral Q6H PRN Gwynne Edinger, MD   5 mg at 05/15/19 0525  . [MAR Hold] pantoprazole (PROTONIX) EC tablet 40 mg  40 mg Oral Daily Wouk, Ailene Rud, MD      . Doug Sou Hold] sodium thiosulfate 25 g in sodium chloride 0.9 % 200 mL Infusion for Calciphylaxis  25 g Intravenous Q M,W,F-HD Penninger, Ria Comment, [place]      . Doug Sou Hold] tamsulosin (  FLOMAX) capsule 0.4 mg  0.4 mg Oral QPC breakfast Wouk, Ailene Rud, MD       No Known Allergies History reviewed. No pertinent family history. Social History   Socioeconomic History  . Marital status: Divorced    Spouse name: Not on file  . Number of children: Not on file  . Years of education: Not on file  . Highest education level: Not on file  Occupational History  . Not on file  Social Needs  . Financial resource strain: Not on file  . Food insecurity    Worry: Not on file    Inability: Not on file  . Transportation needs    Medical: Not on file    Non-medical: Not on file  Tobacco Use  . Smoking status: Former Smoker    Types: Cigarettes    Start date: 11/18/2018    Quit date: 01/18/2019    Years since quitting: 0.3  . Smokeless tobacco: Never Used  . Tobacco comment: pt stated got bored with it  Substance and Sexual Activity  . Alcohol use: Not Currently    Comment: heavy drinker in the  past, none since 07/19/18  . Drug use: Not Currently  . Sexual activity: Not Currently  Lifestyle  . Physical activity    Days per week: Not on file    Minutes per session: Not on file  . Stress: Not on file  Relationships  . Social Herbalist on phone: Not on file    Gets together: Not on file    Attends religious service: Not on file    Active member of club or organization: Not on file    Attends meetings of clubs or organizations: Not on file    Relationship status: Not on file  . Intimate partner violence    Fear of current or ex partner: Not on file    Emotionally abused: Not on file    Physically abused: Not on file    Forced sexual activity: Not on file  Other Topics Concern  . Not on file  Social History Narrative  . Not on file    Physical Exam: Vital signs in last 24 hours: Temp:  [97.8 F (36.6 C)-98.3 F (36.8 C)] 98.1 F (36.7 C) (10/26 1121) Pulse Rate:  [80-97] 88 (10/26 1121) Resp:  [9-24] 18 (10/26 1121) BP: (91-134)/(57-94) 120/69 (10/26 1121) SpO2:  [87 %-100 %] 100 % (10/26 1121) Weight:  [105.6 kg-107.5 kg] 105.6 kg (10/26 1121)   GEN: NAD EYE: Sclerae anicteric ENT: MMM CV: Non-tachycardic GI: Soft, NT/ND NEURO:  Alert & Oriented x 3  Lab Results: Recent Labs    05/14/19 1323 05/14/19 1801 05/15/19 0406  WBC 12.4* 13.3* 9.4  HGB 9.2* 8.4* 6.5*  HCT 30.3* 27.9* 21.9*  PLT 317 351 295   BMET Recent Labs    05/14/19 1323 05/15/19 0406  NA 138  --   K 4.7  --   CL 103  --   CO2 24  --   GLUCOSE 116* 73  BUN 35*  --   CREATININE 6.04*  --   CALCIUM 8.3*  --    LFT Recent Labs    05/14/19 1323  PROT 7.1  ALBUMIN 2.0*  AST 26  ALT 12  ALKPHOS 72  BILITOT 1.1  BILIDIR 0.3*  IBILI 0.8   PT/INR No results for input(s): LABPROT, INR in the last 72 hours.   Impression / Plan: This is a 48 y.o.male who  presents for Flexible sigmoidoscopy.  The risks and benefits of endoscopic evaluation were discussed with  the patient; these include but are not limited to the risk of perforation, infection, bleeding, missed lesions, lack of diagnosis, severe illness requiring hospitalization, as well as anesthesia and sedation related illnesses.  The patient is agreeable to proceed.    Justice Britain, MD [facility] Advanced Endoscopy Office # [phone]

## 2019-05-15 NOTE — ED Notes (Signed)
While in with another room I came out and found that this patient was taken to endo.Marland KitchenMarland Kitchen

## 2019-05-15 NOTE — Consult Note (Addendum)
Consultation  Referring Provider: Dr. Karleen Hampshire      Primary Care Physician:  [name], MD Primary Gastroenterologist:  Althia Forts       Reason for Consultation:  GI Bleed            HPI:   Michael Riggs is a 48 y.o. male with a past medical history as listed below including diabetes and ESRD on hemodialysis Monday Wednesday and Friday, who presented to the ER on 05/14/2019 with a complaint of rectal bleeding.    Recently patient had a prolonged hospital course and started on hemodialysis with a tunneled catheter in place.  He was discharged earlier this month after treatment for lower extremity infection with sepsis and CODE BLUE with intubation, ICU stay and use of pressors, discharged to rehab, last dialysis was Friday.    Today, the patient reports 4 days of constipation which is a new problem for him.  He was given a stool softener in rehab and then felt a "pop"/ release of pressure and had a large output of red blood per nursing there.  He had another 3-4 bloody bowel movements yesterday and looks like another one this morning.  Tells me that he is feeling somewhat weak and cold.  Does describe some abdominal pain in his suprapubic region ever since this started.  Denies previous GI bleeds.  Has never had a colonoscopy.    Denies fever, weight loss, nausea or vomiting.  Per nursing they are getting him 1 unit PRBCs as we speak.  ED course: 05/14/2019 CT abdomen pelvis without contrast showed that the rectum is moderately distended by stool, new mild circumferential rectal wall thickening with mild haziness of perirectal fat, cannot exclude stercoral colitis or other nonspecific proctitis, small to moderate bilateral dependent pleural effusions with associated dependent bibasilar atelectasis and trace pericardial effusion chest thickening, cholelithiasis, stable mild bilateral external iliac lymphadenopathy, nonspecific, presumably reactive, coronary atherosclerosis and aortic  atherosclerosis; CBC with a hemoglobin of 9.2--> 8.4 --> 6.5  GI history: 04/17/2019 patient was consulted by Dr. Michail Sermon for diarrhea-Dr. Patient's diarrhea was nonbloody in setting of recent septic shock at that time patient had increase cholestyramine to 4 g p.o. twice daily, diarrhea slowly improved and GI signed off on 04/20/2019  Past Medical History:  Diagnosis Date  . Anemia   . Depression   . Diabetes mellitus with peripheral vascular disease ([place])   . Dyslipidemia   . Dyspnea   . ESRD (end stage renal disease) on dialysis ([place]) 01/2019   MWF  . GERD (gastroesophageal reflux disease)   . Hypertension   . Physical deconditioning   . Syphilis 01/2019    Past Surgical History:  Procedure Laterality Date  . AV FISTULA PLACEMENT Left 01/09/2019   Procedure: ARTERIOVENOUS (AV) FISTULA CREATION LEFT UPPER ARM;  Surgeon: [name], MD;  Location: [facility] OR;  Service: Vascular;  Laterality: Left;  . below the knee amputation Left   . EYE SURGERY    . IR FLUORO GUIDE CV LINE RIGHT  01/31/2019  . IR FLUORO GUIDE CV LINE RIGHT  02/03/2019  . IR US GUIDE VASC ACCESS RIGHT  01/31/2019  . IR US GUIDE VASC ACCESS RIGHT  02/03/2019    History reviewed. No pertinent family history.   Social History   Tobacco Use  . Smoking status: Former Smoker    Types: Cigarettes    Start date: 11/18/2018    Quit date: 01/18/2019    Years since quitting: 0.3  .  Smokeless tobacco: Never Used  . Tobacco comment: pt stated got bored with it  Substance Use Topics  . Alcohol use: Not Currently    Comment: heavy drinker in the past, none since 07/19/18  . Drug use: Not Currently    Prior to Admission medications   Medication Sig Start Date End Date Taking? Authorizing Provider  acetaminophen (TYLENOL) 325 MG tablet Take 2 tablets (650 mg total) by mouth every 6 (six) hours as needed for mild pain (or Fever >/= 101). 03/13/19  Yes Angiulli, Daniel J, PA-C  albuterol (VENTOLIN HFA) 108 (90 Base) MCG/ACT  inhaler Inhale 2 puffs into the lungs every 6 (six) hours as needed for wheezing or shortness of breath.   Yes [provider]  amLODipine (NORVASC) 5 MG tablet Take 1 tablet (5 mg total) by mouth at bedtime. 04/27/19  Yes Choi, , DO  atorvastatin (LIPITOR) 40 MG tablet Take 1 tablet (40 mg total) by mouth daily. 03/13/19  Yes Angiulli, Daniel J, PA-C  b complex-vitamin c-folic acid (NEPHRO-VITE) 0.8 MG TABS tablet Take 1 tablet by mouth daily.   Yes [provider]  bethanechol (URECHOLINE) 25 MG tablet Take 1 tablet (25 mg total) by mouth 3 (three) times daily. 03/13/19  Yes Angiulli, Daniel J, PA-C  camphor-menthol (SARNA) lotion Apply 1 application topically as needed for itching.   Yes [provider]  cholestyramine (QUESTRAN) 4 g packet Take 2 packets (8 g total) by mouth 2 (two) times daily. 04/27/19  Yes Choi, , DO  collagenase (SANTYL) ointment Apply 1 application topically daily. Apply to the affected area daily plus dry dressing 05/09/19  Yes Duda, Marcus V, MD  hydrocortisone (ANUSOL-HC) 25 MG suppository Place 1 suppository (25 mg total) rectally 2 (two) times daily as needed for hemorrhoids or anal itching. 04/27/19  Yes Choi, , DO  ipratropium-albuterol (DUONEB) 0.5-2.5 (3) MG/3ML SOLN Take 3 mLs by nebulization every 6 (six) hours as needed (For shortness of breath).   Yes [provider]  Lidocaine-Prilocaine &Lido HCl 2.5-2.5 & 3.88 % KIT Apply 1 application topically every Monday, Wednesday, and Friday. Apply to fistula and wrap with saran wrap site prior to dialysys   Yes [provider]  Loperamide HCl 1 MG/7.5ML LIQD Take 30 mLs by mouth daily as needed (For loose stools).   Yes [provider]  Melatonin 3 MG TABS Take 1.5 tablets (4.5 mg total) by mouth at bedtime. 02/08/19  Yes Ezenduka, Nkeiruka J, MD  midodrine (PROAMATINE) 10 MG tablet Take 1 tablet (10 mg total) by mouth every Monday, Wednesday, and  Friday. 04/28/19 05/28/19 Yes Choi, , DO  nystatin (NYSTATIN) powder Apply 1 Bottle topically 3 (three) times daily.   Yes [provider]  oxyCODONE (OXY IR/ROXICODONE) 5 MG immediate release tablet Take 1 tablet (5 mg total) by mouth every 6 (six) hours as needed for severe pain or breakthrough pain. 04/27/19  Yes Choi, , DO  pantoprazole (PROTONIX) 40 MG tablet Take 1 tablet (40 mg total) by mouth daily. 03/13/19  Yes Angiulli, Daniel J, PA-C  polyethylene glycol (MIRALAX / GLYCOLAX) 17 g packet Take 17 g by mouth daily. Patient taking differently: Take 17 g by mouth daily as needed for mild constipation or moderate constipation. 4-8 ounces 02/09/19  Yes Ezenduka, Nkeiruka J, MD  silver sulfADIAZINE (SILVADENE) 1 % cream Apply 1 application topically daily.   Yes [provider]  tamsulosin (FLOMAX) 0.4 MG CAPS capsule Take 1 capsule (0.4 mg total) by mouth   daily after breakfast. 03/13/19  Yes Angiulli, Daniel J, PA-C  traMADol (ULTRAM) 50 MG tablet Take 1 tablet (50 mg total) by mouth every 6 (six) hours as needed for moderate pain. 04/27/19  Yes Choi, , DO  DULoxetine HCl 40 MG CPEP Take 40 mg by mouth daily. 04/27/19   Choi, , DO  Hydrocortisone (GERHARDT'S BUTT CREAM) CREA Apply 1 application topically 3 (three) times daily as needed for irritation. 04/27/19   Choi, , DO    Current Facility-Administered Medications  Medication Dose Route Frequency Provider Last Rate Last Dose  . 0.9 %  sodium chloride infusion (Manually program via Guardrails IV Fluids)   Intravenous Once Wouk, Noah Bedford, MD   Stopped at 05/14/19 2208  . 0.9 %  sodium chloride infusion (Manually program via Guardrails IV Fluids)   Intravenous Once Akula, Vijaya, MD      . albuterol (VENTOLIN HFA) 108 (90 Base) MCG/ACT inhaler 2 puff  2 puff Inhalation Q6H PRN Wouk, Noah Bedford, MD      . bethanechol (URECHOLINE) tablet 25 mg  25 mg Oral TID Wouk, Noah Bedford, MD   25 mg  at 05/15/19 0247  . cefTRIAXone (ROCEPHIN) 2 g in sodium chloride 0.9 % 100 mL IVPB  2 g Intravenous Daily Wouk, Noah Bedford, MD      . cholestyramine (QUESTRAN) packet 8 g  8 g Oral BID Wouk, Noah Bedford, MD   8 g at 05/15/19 0234  . DULoxetine (CYMBALTA) DR capsule 40 mg  40 mg Oral Daily Wouk, Noah Bedford, MD      . lactated ringers infusion   Intravenous Continuous Wouk, Noah Bedford, MD 100 mL/hr at 05/14/19 2327    . Melatonin TABS 4.5 mg  4.5 mg Oral QHS Wouk, Noah Bedford, MD   4.5 mg at 05/15/19 0247  . metroNIDAZOLE (FLAGYL) IVPB 500 mg  500 mg Intravenous Q8H Wouk, Noah Bedford, MD   Stopped at 05/14/19 2324  . midodrine (PROAMATINE) tablet 10 mg  10 mg Oral Q M,W,F Wouk, Noah Bedford, MD      . oxyCODONE (Oxy IR/ROXICODONE) immediate release tablet 5 mg  5 mg Oral Q6H PRN Wouk, Noah Bedford, MD   5 mg at 05/15/19 0525  . pantoprazole (PROTONIX) EC tablet 40 mg  40 mg Oral Daily Wouk, Noah Bedford, MD      . tamsulosin (FLOMAX) capsule 0.4 mg  0.4 mg Oral QPC breakfast Wouk, Noah Bedford, MD       Current Outpatient Medications  Medication Sig Dispense Refill  . acetaminophen (TYLENOL) 325 MG tablet Take 2 tablets (650 mg total) by mouth every 6 (six) hours as needed for mild pain (or Fever >/= 101).    . albuterol (VENTOLIN HFA) 108 (90 Base) MCG/ACT inhaler Inhale 2 puffs into the lungs every 6 (six) hours as needed for wheezing or shortness of breath.    . amLODipine (NORVASC) 5 MG tablet Take 1 tablet (5 mg total) by mouth at bedtime. 30 tablet 0  . atorvastatin (LIPITOR) 40 MG tablet Take 1 tablet (40 mg total) by mouth daily.    . b complex-vitamin c-folic acid (NEPHRO-VITE) 0.8 MG TABS tablet Take 1 tablet by mouth daily.    . bethanechol (URECHOLINE) 25 MG tablet Take 1 tablet (25 mg total) by mouth 3 (three) times daily.    . camphor-menthol (SARNA) lotion Apply 1 application topically as needed for itching.    . cholestyramine (QUESTRAN) 4 g packet Take 2 packets (8 g    total) by mouth 2 (two) times daily. 60 each 0  . collagenase (SANTYL) ointment Apply 1 application topically daily. Apply to the affected area daily plus dry dressing 90 g 3  . hydrocortisone (ANUSOL-HC) 25 MG suppository Place 1 suppository (25 mg total) rectally 2 (two) times daily as needed for hemorrhoids or anal itching. 12 suppository 0  . ipratropium-albuterol (DUONEB) 0.5-2.5 (3) MG/3ML SOLN Take 3 mLs by nebulization every 6 (six) hours as needed (For shortness of breath).    . Lidocaine-Prilocaine &Lido HCl 2.5-2.5 & 3.88 % KIT Apply 1 application topically every Monday, Wednesday, and Friday. Apply to fistula and wrap with saran wrap site prior to dialysys    . Loperamide HCl 1 MG/7.5ML LIQD Take 30 mLs by mouth daily as needed (For loose stools).    . Melatonin 3 MG TABS Take 1.5 tablets (4.5 mg total) by mouth at bedtime.  0  . midodrine (PROAMATINE) 10 MG tablet Take 1 tablet (10 mg total) by mouth every Monday, Wednesday, and Friday. 12 tablet 0  . nystatin (NYSTATIN) powder Apply 1 Bottle topically 3 (three) times daily.    . oxyCODONE (OXY IR/ROXICODONE) 5 MG immediate release tablet Take 1 tablet (5 mg total) by mouth every 6 (six) hours as needed for severe pain or breakthrough pain. 28 tablet 0  . pantoprazole (PROTONIX) 40 MG tablet Take 1 tablet (40 mg total) by mouth daily.    . polyethylene glycol (MIRALAX / GLYCOLAX) 17 g packet Take 17 g by mouth daily. (Patient taking differently: Take 17 g by mouth daily as needed for mild constipation or moderate constipation. 4-8 ounces) 14 each 0  . silver sulfADIAZINE (SILVADENE) 1 % cream Apply 1 application topically daily.    . tamsulosin (FLOMAX) 0.4 MG CAPS capsule Take 1 capsule (0.4 mg total) by mouth daily after breakfast. 30 capsule   . traMADol (ULTRAM) 50 MG tablet Take 1 tablet (50 mg total) by mouth every 6 (six) hours as needed for moderate pain. 30 tablet 0  . DULoxetine HCl 40 MG CPEP Take 40 mg by mouth daily. 30  capsule 0  . Hydrocortisone (GERHARDT'S BUTT CREAM) CREA Apply 1 application topically 3 (three) times daily as needed for irritation. 1 each 0    Allergies as of 05/14/2019  . (No Known Allergies)     Review of Systems:    Constitutional: No weight loss, fever or chills Skin: No rash Cardiovascular: No chest pain Respiratory: No SOB  Gastrointestinal: See HPI and otherwise negative Genitourinary: No dysuria Neurological: No headache, dizziness or syncope Musculoskeletal: No new muscle or joint pain Hematologic: No bruising Psychiatric: No history of depression or anxiety    Physical Exam:  Vital signs in last 24 hours: Temp:  [98.3 F (36.8 C)] 98.3 F (36.8 C) (10/25 1312) Pulse Rate:  [80-97] 84 (10/26 0800) Resp:  [9-24] 10 (10/26 0800) BP: (95-134)/(57-94) 107/66 (10/26 0800) SpO2:  [88 %-100 %] 96 % (10/26 0800) Weight:  [107.5 kg] 107.5 kg (10/25 1308)   General:   Pleasant obese hispanic male appears to be in NAD, Well developed, Well nourished, alert and cooperative Head:  Normocephalic and atraumatic. Eyes:   PEERL, EOMI. No icterus. Conjunctiva pink. Ears:  Normal auditory acuity. Neck:  Supple Throat: Oral cavity and pharynx without inflammation, swelling or lesion.  Lungs: Respirations even and unlabored. Lungs clear to auscultation bilaterally.   No wheezes, crackles, or rhonchi.  Heart: Normal S1, S2. No MRG. Regular rate and rhythm. No   peripheral edema, cyanosis or pallor.  Abdomen:  Soft, nondistended, mild suprapubic ttp. No rebound or guarding. Normal bowel sounds. No appreciable masses or hepatomegaly. Rectal:  Not performed.  Msk:  Symmetrical without gross deformities. Peripheral pulses intact.  Extremities:  +left BKA Neurologic:  Alert and  oriented x4;  grossly normal neurologically.   Skin:   +extensive abrasions and scabbing on lower extremitis, stage 2 sacral decubitus ulcer Psychiatric: Demonstrates good judgement and reason without abnormal  affect or behaviors.   LAB RESULTS: Recent Labs    05/14/19 1323 05/14/19 1801 05/15/19 0406  WBC 12.4* 13.3* 9.4  HGB 9.2* 8.4* 6.5*  HCT 30.3* 27.9* 21.9*  PLT 317 351 295   BMET Recent Labs    05/14/19 1323 05/15/19 0406  NA 138  --   K 4.7  --   CL 103  --   CO2 24  --   GLUCOSE 116* 73  BUN 35*  --   CREATININE 6.04*  --   CALCIUM 8.3*  --    LFT Recent Labs    05/14/19 1323  PROT 7.1  ALBUMIN 2.0*  AST 26  ALT 12  ALKPHOS 72  BILITOT 1.1  BILIDIR 0.3*  IBILI 0.8   STUDIES: Ct Abdomen Pelvis Wo Contrast  Result Date: 05/14/2019 CLINICAL DATA:  Rectal bleeding this morning. Constipation. Abdominal distension. EXAM: CT ABDOMEN AND PELVIS WITHOUT CONTRAST TECHNIQUE: Multidetector CT imaging of the abdomen and pelvis was performed following the standard protocol without IV contrast. COMPARISON:  04/27/2019 abdominal radiograph. 02/11/2019 CT abdomen/pelvis. FINDINGS: Lower chest: Small to moderate bilateral dependent pleural effusions with associated dependent bibasilar atelectasis. Coronary atherosclerosis. Trace pericardial effusion/thickening. Hepatobiliary: Normal liver size. No liver mass. Cholelithiasis. No significant gallbladder distention. No gallbladder wall thickening. No pericholecystic fluid. No biliary ductal dilatation. Pancreas: Normal, with no mass or duct dilation. Spleen: Normal size. No mass. Adrenals/Urinary Tract: Normal adrenals. No hydronephrosis. No renal stones. No contour deforming renal masses. Nondistended bladder with questionable diffuse bladder wall thickening. Stomach/Bowel: Normal non-distended stomach. Normal caliber small bowel with no small bowel wall thickening. Normal appendix. Rectum moderately distended by stool. Mild circumferential rectal wall thickening is new with mild haziness of perirectal fat. Otherwise unremarkable large bowel with no diverticulosis. Vascular/Lymphatic: Atherosclerotic nonaneurysmal abdominal aorta. Mild  bilateral external iliac lymphadenopathy measuring up to 1.0 cm on the left (series 8/image 86) and 1.2 cm on the right (series 8/image 85), stable since 02/11/2019 CT. No new pathologically enlarged abdominopelvic nodes. Reproductive: Atrophic appearing prostate. Other: No pneumoperitoneum, ascites or focal fluid collection. Musculoskeletal: No aggressive appearing focal osseous lesions. Moderate thoracolumbar spondylosis. IMPRESSION: 1. Rectum moderately distended by stool. New mild circumferential rectal wall thickening with mild haziness of perirectal fat, cannot exclude stercoral colitis or other nonspecific proctitis. 2. Small to moderate bilateral dependent pleural effusions with associated dependent bibasilar atelectasis. Trace pericardial effusion/thickening. 3. Cholelithiasis. 4. Stable mild bilateral external iliac lymphadenopathy, nonspecific, presumably reactive. 5. Coronary atherosclerosis. 6. Aortic Atherosclerosis (ICD10-I70.0). Electronically Signed   By: Jason A Poff M.D.   On: 05/14/2019 17:11    Impression / Plan:   Impression: 1.  Acute blood loss anemia: Patient with multiple episodes of large-volume bright red blood per rectum, hemoglobin 9.2--> 6.5 since admission to the ER last night, is about to start 1 unit PRBCs, CT with rectal wall thickening, history of constipation for 4 days; consider most likely stercoral ulcer versus other proctitis 2.  ESRD on HD 3.  Hypotension 4.  OSA  Plan: 1.     OrderedAbdominal xray to ensure there is no perforation given pain and abnormal CT 2.  Agree with 2 unit PRBCs, hopefully at least first unit can be hung before time of procedure 3.  We will plan for an unprepped flex sigmoidoscopy this morning with Dr. Mansouraty for further evaluation.  Did discuss risks, benefits, limitations and alternatives and the patient agrees to proceed. 4.  Patient will remain n.p.o. until after time of procedure 5.  Please await any further recommendations from  Dr. Mansouraty after flex sig.  Thank you for your kind consultation, we will continue to follow.   Lynne   05/15/2019, 8:51 AM   

## 2019-05-15 NOTE — Anesthesia Preprocedure Evaluation (Addendum)
Anesthesia Evaluation  Patient identified by MRN, date of birth, ID band Patient awake    Reviewed: Allergy & Precautions, NPO status , Patient's Chart, lab work & pertinent test results  Airway Mallampati: II  TM Distance: >3 FB Neck ROM: Limited    Dental  (+) Teeth Intact, Dental Advisory Given   Pulmonary shortness of breath, former smoker,    breath sounds clear to auscultation       Cardiovascular hypertension, + Peripheral Vascular Disease   Rhythm:Regular Rate:Normal     Neuro/Psych PSYCHIATRIC DISORDERS Depression    GI/Hepatic Neg liver ROS, GERD  ,  Endo/Other  diabetes  Renal/GU Renal disease     Musculoskeletal   Abdominal   Peds  Hematology   Anesthesia Other Findings   Reproductive/Obstetrics                           Anesthesia Physical Anesthesia Plan  ASA: III  Anesthesia Plan: MAC   Post-op Pain Management:    Induction: Intravenous  PONV Risk Score and Plan: 1 and Ondansetron, Dexamethasone and Midazolam  Airway Management Planned: Nasal Cannula and Natural Airway  Additional Equipment:   Intra-op Plan:   Post-operative Plan:   Informed Consent: I have reviewed the patients History and Physical, chart, labs and discussed the procedure including the risks, benefits and alternatives for the proposed anesthesia with the patient or authorized representative who has indicated his/her understanding and acceptance.     Dental advisory given  Plan Discussed with: CRNA and Anesthesiologist  Anesthesia Plan Comments:         Anesthesia Quick Evaluation

## 2019-05-15 NOTE — Op Note (Signed)
[facility] Patient Name: Michael Riggs Procedure Date : 05/15/2019 MRN: [id number] Attending MD: Justice Britain , MD Date of Birth: 1970-10-19 CSN: [id number] Age: 48 Admit Type: Inpatient Procedure:                Flexible Sigmoidoscopy Indications:              Hematochezia, Acute post hemorrhagic anemia Providers:                Justice Britain, MD, Lauralyn Primes Tech., Technician, Garrison [place], CRNA Referring MD:             [facility] Medicines:                Monitored Anesthesia Care Complications:            No immediate complications. Estimated Blood Loss:     Estimated blood loss: none. Procedure:                Pre-Anesthesia Assessment:                           - Prior to the procedure, a History and Physical                            was performed, and patient medications and                            allergies were reviewed. The patient's tolerance of                            previous anesthesia was also reviewed. The risks                            and benefits of the procedure and the sedation                            options and risks were discussed with the patient.                            All questions were answered, and informed consent                            was obtained. Prior Anticoagulants: The patient has                            taken no previous anticoagulant or antiplatelet                            agents. ASA Grade Assessment: III - A patient with                            severe systemic disease. After reviewing the risks  and benefits, the patient was deemed in                            satisfactory condition to undergo the procedure.                           After obtaining informed consent, the scope was                            passed under direct vision. The GIF-H190 ([id number])                            Olympus gastroscope was  introduced through the anus                            and advanced to the the sigmoid colon. The flexible                            sigmoidoscopy was accomplished without difficulty.                            The patient tolerated the procedure. The quality of                            the bowel preparation was fair. Scope In: Scope Out: Findings:      The perianal exam findings include a small skin excoriations.      The digital rectal exam findings include hemorrhoids. Pertinent       negatives include no palpable rectal lesions.      Red blood was found in the rectum, in the recto-sigmoid colon and in the       sigmoid colon. Lavage of the area was performed using copious amounts,       resulting in incomplete clearance with fair visualization.      A single (solitary) ten mm ulcer was found in the distal rectum.       Spurting bleeding was present. Stigmata of recent bleeding were present.       Area was successfully injected with 4 mL of a 1:10,000 solution of       epinephrine for hemostasis. Coagulation for hemostasis using bipolar       probe was successful. For hemostasis, five hemostatic clips were       successfully placed (MR conditional). There was no bleeding at the end       of the procedure. Impression:               - Preparation of the colon was fair with                            significant blood noted. Lavaged significantly.                           - Skin excoriations found on perianal exam.                           - Hemorrhoids found on digital rectal exam.                           -  Blood in the rectum, in the recto-sigmoid colon                            and in the sigmoid colon.                           - A single (solitary) ulcer in the distal rectum.                            Injected. Treated with bipolar cautery. Clips (MR                            conditional) were placed. Recommendation:           - The patient will be observed  post-procedure,                            until all discharge criteria are met.                           - Return patient to hospital ward for ongoing care.                           - Full liquid diet today.                           - Monitor for signs/symptoms of bleeding,                            perforation, and infection. If issues please call                            our number to get further assistance as needed.                           - Trend Hgb/Hct Q6-8H (he is to start his 2nd unit                            of pRBCs now post-procedure).                           - Patient will requilibrate over the next 24-48                            hours so slight decrease in hemoglobin may be                            expected. Patient will also have some old blood per                            rectum and should be monitored. I expect overall                            amount to decrease. If he has recurrent  significant                            bleeding, then please contact GI. We would consider                            relook and potential need for OVESCO or retreatment                            vs if unstable then get to ICU and possible IR                            Intervention for engioembolization.                           - No chemical VTE PPx for 48 hours but should use                            TED/SCDs for now as able.                           - Full Colonoscopy should be completed at some                            point in the future after he has healed to better                            understand his colon and other issues of                            incontinence/decreased rectal sensation.                           - The findings and recommendations were discussed                            with the patient.                           - The findings and recommendations were discussed                            with the referring physician. Procedure  Code(s):        --- Professional ---                           [phone], Sigmoidoscopy, flexible; with control of                            bleeding, any method Diagnosis Code(s):        --- Professional ---                           K64.9, Unspecified hemorrhoids  L89.899, Pressure ulcer of other site, unspecified                            stage                           K62.6, Ulcer of anus and rectum                           K62.5, Hemorrhage of anus and rectum                           K92.2, Gastrointestinal hemorrhage, unspecified                           K92.1, Melena (includes Hematochezia)                           D62, Acute posthemorrhagic anemia CPT copyright 2019 [employer]. All rights reserved. The codes documented in this report are preliminary and upon coder review may  be revised to meet current compliance requirements. Justice Britain, MD 05/15/2019 1:50:42 PM Number of Addenda: 0

## 2019-05-15 NOTE — Anesthesia Procedure Notes (Signed)
Procedure Name: MAC Date/Time: 05/15/2019 12:50 PM Performed by: Harden Mo, CRNA Pre-anesthesia Checklist: Patient identified, Emergency Drugs available, Suction available and Patient being monitored Patient Re-evaluated:Patient Re-evaluated prior to induction Oxygen Delivery Method: Simple face mask Preoxygenation: Pre-oxygenation with 100% oxygen Induction Type: IV induction Placement Confirmation: positive ETCO2 and breath sounds checked- equal and bilateral Dental Injury: Teeth and Oropharynx as per pre-operative assessment

## 2019-05-15 NOTE — Transfer of Care (Signed)
Immediate Anesthesia Transfer of Care Note  Patient: Melecio Cueto  Procedure(s) Performed: FLEXIBLE SIGMOIDOSCOPY (N/A ) HOT HEMOSTASIS (ARGON PLASMA COAGULATION/BICAP) (N/A ) HEMOSTASIS CLIP PLACEMENT SCLEROTHERAPY  Patient Location: Endoscopy Unit  Anesthesia Type:MAC  Level of Consciousness: awake, alert  and oriented  Airway & Oxygen Therapy: Patient Spontanous Breathing and Patient connected to face mask oxygen  Post-op Assessment: Report given to RN and Post -op Vital signs reviewed and stable  Post vital signs: Reviewed and stable  Last Vitals:  Vitals Value Taken Time  BP 94/64 05/15/19 1403  Temp 36.5 C 05/15/19 1350  Pulse 85 05/15/19 1409  Resp 17 05/15/19 1409  SpO2 93 % 05/15/19 1409  Vitals shown include unvalidated device data.  Last Pain:  Vitals:   05/15/19 1400  TempSrc:   PainSc: 5          Complications: No apparent anesthesia complications

## 2019-05-15 NOTE — ED Notes (Signed)
Pt requests a second full liquid tray due to not eating for over 24 hours. Another tray ordered.

## 2019-05-15 NOTE — ED Notes (Signed)
Pt had large BM. Copious amounts of blood and clots were present. RN informed.

## 2019-05-15 NOTE — Consult Note (Signed)
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  [place] team has evaluated  Surgery has consulted and no recommended intervention.   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  MASD to buttocks and bilateral gluteal folds, partial thickness tissue loss Wound [id number] to legs  Pink partial thickness loss to buttocks. Drainage (amount, consistency, odor) moderate serosanguinous  Necrotic odor Periwound:mottled violaceous ischemic zones.  Surrounding skin is reticulated and mottled.   Dressing procedure/placement/frequency:  Xeroform gauze to necrotic wounds.   Gerhardts butt paste to buttocks twice daily.  Will not follow at this time.  Please re-consult if needed.  Domenic Moras MSN, RN, FNP-BC CWON Wound, Ostomy, Continence Nurse Pager [phone]

## 2019-05-15 NOTE — ED Notes (Signed)
Pt just arrived back to ED from endo. Pt is alert and ox4. Called dialysis to inform he is back in department and they state they will call when they have an open bed.

## 2019-05-15 NOTE — ED Notes (Signed)
Admitting paged due to hgb 6.5

## 2019-05-15 NOTE — ED Notes (Signed)
Just informed by hemodialysis he would not be having his treatment until tomorrow morning.

## 2019-05-15 NOTE — Progress Notes (Signed)
Hypoglycemic Event  CBG: 66  Treatment: D50 50 mL (25 gm)  Symptoms: Pale and Sweaty  Follow-up CBG: Time:1240 CBG Result:127  Possible Reasons for Event: Inadequate meal intake-NPO for procedure  Comments/MD notified:MD Green Notified    Cheri Guppy

## 2019-05-15 NOTE — ED Notes (Signed)
Pt has poor IV access ans currently only one line. Will give antibiotics after blood transfusion.

## 2019-05-16 ENCOUNTER — Encounter (HOSPITAL_COMMUNITY): Payer: Self-pay | Admitting: Gastroenterology

## 2019-05-16 DIAGNOSIS — K626 Ulcer of anus and rectum: Secondary | ICD-10-CM | POA: Diagnosis not present

## 2019-05-16 DIAGNOSIS — D62 Acute posthemorrhagic anemia: Secondary | ICD-10-CM | POA: Diagnosis not present

## 2019-05-16 LAB — BASIC METABOLIC PANEL
Anion gap: 10 (ref 5–15)
BUN: 47 mg/dL — ABNORMAL HIGH (ref 6–20)
CO2: 23 mmol/L (ref 22–32)
Calcium: 8.1 mg/dL — ABNORMAL LOW (ref 8.9–10.3)
Chloride: 101 mmol/L (ref 98–111)
Creatinine, Ser: 7.57 mg/dL — ABNORMAL HIGH (ref 0.61–1.24)
GFR calc Af Amer: 9 mL/min — ABNORMAL LOW (ref >60)
GFR calc non Af Amer: 8 mL/min — ABNORMAL LOW (ref >60)
Glucose, Bld: 79 mg/dL (ref 70–99)
Potassium: 4 mmol/L (ref 3.5–5.1)
Sodium: 134 mmol/L — ABNORMAL LOW (ref 135–145)

## 2019-05-16 LAB — CBC
HCT: 24.6 % — ABNORMAL LOW (ref 39.0–52.0)
Hemoglobin: 7.6 g/dL — ABNORMAL LOW (ref 13.0–17.0)
MCH: 28.5 pg (ref 26.0–34.0)
MCHC: 30.9 g/dL (ref 30.0–36.0)
MCV: 92.1 fL (ref 80.0–100.0)
Platelets: 295 10*3/uL (ref 150–400)
RBC: 2.67 MIL/uL — ABNORMAL LOW (ref 4.22–5.81)
RDW: 16.1 % — ABNORMAL HIGH (ref 11.5–15.5)
WBC: 8.7 10*3/uL (ref 4.0–10.5)
nRBC: 0 % (ref 0.0–0.2)

## 2019-05-16 LAB — GLUCOSE, CAPILLARY
Glucose-Capillary: 125 mg/dL — ABNORMAL HIGH (ref 70–99)
Glucose-Capillary: 114 mg/dL — ABNORMAL HIGH (ref 70–99)
Glucose-Capillary: 75 mg/dL (ref 70–99)
Glucose-Capillary: 94 mg/dL (ref 70–99)

## 2019-05-16 LAB — HEMOGLOBIN A1C
Hgb A1c MFr Bld: 5.3 % (ref 4.8–5.6)
Mean Plasma Glucose: 105.41 mg/dL

## 2019-05-16 LAB — HEMOGLOBIN AND HEMATOCRIT, BLOOD
HCT: 24.5 % — ABNORMAL LOW (ref 39.0–52.0)
Hemoglobin: 7.7 g/dL — ABNORMAL LOW (ref 13.0–17.0)

## 2019-05-16 LAB — HEPATITIS B SURFACE ANTIGEN: Hepatitis B Surface Ag: NONREACTIVE

## 2019-05-16 MED ORDER — LIDOCAINE-PRILOCAINE 2.5-2.5 % EX CREA: 1.0000 "application " | TOPICAL_CREAM | CUTANEOUS | Status: DC | PRN

## 2019-05-16 MED ORDER — LIDOCAINE HCL (PF) 1 % IJ SOLN: 5.0000 mL | INTRAMUSCULAR | Status: DC | PRN

## 2019-05-16 MED ORDER — TRAMADOL HCL 50 MG PO TABS
50.0000 mg | ORAL_TABLET | Freq: Four times a day (QID) | ORAL | Status: DC | PRN
  Administered 2019-05-16 – 2019-05-24 (×13): 50 mg via ORAL
  Filled 2019-05-16 (×14): qty 1

## 2019-05-16 MED ORDER — ALTEPLASE 2 MG IJ SOLR: 2.0000 mg | Freq: Once | INTRAMUSCULAR | Status: DC | PRN

## 2019-05-16 MED ORDER — PENTAFLUOROPROP-TETRAFLUOROETH EX AERO: 1.0000 "application " | INHALATION_SPRAY | CUTANEOUS | Status: DC | PRN

## 2019-05-16 MED ORDER — GERHARDT'S BUTT CREAM
TOPICAL_CREAM | Freq: Two times a day (BID) | CUTANEOUS | Status: DC
  Administered 2019-05-16 – 2019-05-17 (×3): via TOPICAL
  Administered 2019-05-17 – 2019-05-18 (×2): 1 via TOPICAL
  Administered 2019-05-18 – 2019-05-24 (×12): via TOPICAL
  Filled 2019-05-16: qty 1

## 2019-05-16 MED ORDER — HEPARIN SODIUM (PORCINE) 1000 UNIT/ML IJ SOLN
INTRAMUSCULAR | Status: AC
  Administered 2019-05-16: 3800 [IU] via INTRAVENOUS_CENTRAL
  Filled 2019-05-16: qty 4

## 2019-05-16 MED ORDER — SODIUM CHLORIDE 0.9 % IV SOLN: 100.0000 mL | INTRAVENOUS | Status: DC | PRN

## 2019-05-16 MED ORDER — OXYCODONE HCL 5 MG PO TABS
ORAL_TABLET | ORAL | Status: AC
  Administered 2019-05-16: 5 mg via ORAL
  Filled 2019-05-16: qty 1

## 2019-05-16 MED ORDER — INSULIN ASPART 100 UNIT/ML ~~LOC~~ SOLN
0.0000 [IU] | Freq: Three times a day (TID) | SUBCUTANEOUS | Status: DC
  Administered 2019-05-22: 17:00:00 2 [IU] via SUBCUTANEOUS

## 2019-05-16 MED ORDER — HEPARIN SODIUM (PORCINE) 1000 UNIT/ML DIALYSIS
1000.0000 [IU] | INTRAMUSCULAR | Status: DC | PRN
  Administered 2019-05-16: 16:00:00 3800 [IU] via INTRAVENOUS_CENTRAL
  Filled 2019-05-16: qty 1

## 2019-05-16 MED ORDER — CHLORHEXIDINE GLUCONATE CLOTH 2 % EX PADS
6.0000 | MEDICATED_PAD | Freq: Every day | CUTANEOUS | Status: DC
  Administered 2019-05-17 – 2019-05-21 (×4): 6 via TOPICAL

## 2019-05-16 MED ORDER — INSULIN ASPART 100 UNIT/ML ~~LOC~~ SOLN: 0.0000 [IU] | Freq: Every day | SUBCUTANEOUS | Status: DC

## 2019-05-16 NOTE — Progress Notes (Signed)
Pt off unit to dialysis via bed, report given to RN.

## 2019-05-16 NOTE — Progress Notes (Signed)
Daily Rounding Note  05/16/2019, 10:54 AM  LOS: 2 days   SUBJECTIVE:   Chief complaint:   Rectal pain, constipation. Bowel movements yesterday, none yet today.  OBJECTIVE:         Vital signs in last 24 hours:    Temp:  [97.1 F (36.2 C)-98.4 F (36.9 C)] 98.3 F (36.8 C) (10/27 0352) Pulse Rate:  [68-93] 78 (10/27 0029) Resp:  [12-27] 17 (10/27 0029) BP: (94-161)/(47-88) 115/71 (10/26 2315) SpO2:  [88 %-100 %] 97 % (10/27 0029) Weight:  [105.6 kg] 105.6 kg (10/26 1121) Last BM Date: 05/16/19 Filed Weights   05/14/19 1308 05/15/19 1121  Weight: 107.5 kg 105.6 kg   General: Obese, somewhat pale, alert, does not look acutely ill Heart: RRR. Chest: No labored breathing or cough Abdomen: Soft, obese, nontender.  Active bowel sounds.  No distention Extremities: Left BKA. Neuro/Psych: Calm, pleasant, cooperative.  Good historian.  No obvious deficits, no tremors.  Intake/Output from previous day: 10/26 0701 - 10/27 0700 In: 1520 [P.O.:720; I.V.:500; IV Piggyback:300] Out: 100 [Blood:100]  Intake/Output this shift: No intake/output data recorded.  Lab Results: Recent Labs    05/14/19 1801 05/15/19 0406 05/15/19 1227 05/15/19 2345 05/16/19 0627  WBC 13.3* 9.4  --   --  8.7  HGB 8.4* 6.5* 7.8* 7.7* 7.6*  HCT 27.9* 21.9* 23.0* 24.5* 24.6*  PLT 351 295  --   --  295   BMET Recent Labs    05/14/19 1323 05/15/19 0406 05/15/19 1227 05/16/19 0627  NA 138  --  138 134*  K 4.7  --  4.3 4.0  CL 103  --  104 101  CO2 24  --   --  23  GLUCOSE 116* 73 79 79  BUN 35*  --  46* 47*  CREATININE 6.04*  --  7.60* 7.57*  CALCIUM 8.3*  --   --  8.1*   LFT Recent Labs    05/14/19 1323  PROT 7.1  ALBUMIN 2.0*  AST 26  ALT 12  ALKPHOS 72  BILITOT 1.1  BILIDIR 0.3*  IBILI 0.8   PT/INR No results for input(s): LABPROT, INR in the last 72 hours. Hepatitis Panel No results for input(s): HEPBSAG, HCVAB,  HEPAIGM, HEPBIGM in the last 72 hours.  Studies/Results: Ct Abdomen Pelvis Wo Contrast  Result Date: 05/14/2019 CLINICAL DATA:  Rectal bleeding this morning. Constipation. Abdominal distension. EXAM: CT ABDOMEN AND PELVIS WITHOUT CONTRAST TECHNIQUE: Multidetector CT imaging of the abdomen and pelvis was performed following the standard protocol without IV contrast. COMPARISON:  04/27/2019 abdominal radiograph. 02/11/2019 CT abdomen/pelvis. FINDINGS: Lower chest: Small to moderate bilateral dependent pleural effusions with associated dependent bibasilar atelectasis. Coronary atherosclerosis. Trace pericardial effusion/thickening. Hepatobiliary: Normal liver size. No liver mass. Cholelithiasis. No significant gallbladder distention. No gallbladder wall thickening. No pericholecystic fluid. No biliary ductal dilatation. Pancreas: Normal, with no mass or duct dilation. Spleen: Normal size. No mass. Adrenals/Urinary Tract: Normal adrenals. No hydronephrosis. No renal stones. No contour deforming renal masses. Nondistended bladder with questionable diffuse bladder wall thickening. Stomach/Bowel: Normal non-distended stomach. Normal caliber small bowel with no small bowel wall thickening. Normal appendix. Rectum moderately distended by stool. Mild circumferential rectal wall thickening is new with mild haziness of perirectal fat. Otherwise unremarkable large bowel with no diverticulosis. Vascular/Lymphatic: Atherosclerotic nonaneurysmal abdominal aorta. Mild bilateral external iliac lymphadenopathy measuring up to 1.0 cm on the left (series 8/image 86) and 1.2 cm on the right (series 8/image  85), stable since 02/11/2019 CT. No new pathologically enlarged abdominopelvic nodes. Reproductive: Atrophic appearing prostate. Other: No pneumoperitoneum, ascites or focal fluid collection. Musculoskeletal: No aggressive appearing focal osseous lesions. Moderate thoracolumbar spondylosis. IMPRESSION: 1. Rectum moderately  distended by stool. New mild circumferential rectal wall thickening with mild haziness of perirectal fat, cannot exclude stercoral colitis or other nonspecific proctitis. 2. Small to moderate bilateral dependent pleural effusions with associated dependent bibasilar atelectasis. Trace pericardial effusion/thickening. 3. Cholelithiasis. 4. Stable mild bilateral external iliac lymphadenopathy, nonspecific, presumably reactive. 5. Coronary atherosclerosis. 6. Aortic Atherosclerosis (ICD10-I70.0). Electronically Signed   By: Ilona Sorrel M.D.   On: 05/14/2019 17:11   Dg Abd 2 Views  Result Date: 05/15/2019 CLINICAL DATA:  Lower GI bleed. EXAM: ABDOMEN - 2 VIEW COMPARISON:  CT scan 05/14/2019 FINDINGS: Supine and decubitus views of the abdomen show no gaseous bowel dilatation to suggest obstruction. No non dependent free gas visible on the decubitus film to suggest perforation. Visualized portions of the lower lungs appear clear with tiny bilateral pleural effusions. IMPRESSION: Negative. Electronically Signed   By: Misty Stanley M.D.   On: 05/15/2019 10:39   Scheduled Meds: . sodium chloride   Intravenous Once  . bethanechol  25 mg Oral TID  . Chlorhexidine Gluconate Cloth  6 each Topical Q0600  . cholestyramine  8 g Oral BID  . DULoxetine  40 mg Oral Daily  . Gerhardt's butt cream   Topical BID  . insulin aspart  0-5 Units Subcutaneous QHS  . insulin aspart  0-9 Units Subcutaneous TID WC  . Melatonin  4.5 mg Oral QHS  . midodrine  10 mg Oral Q M,W,F  . pantoprazole  40 mg Oral Daily  . tamsulosin  0.4 mg Oral QPC breakfast   Continuous Infusions: . cefTRIAXone (ROCEPHIN)  IV 2 g (05/16/19 1024)  . ferric gluconate (FERRLECIT/NULECIT) IV    . metronidazole 500 mg (05/16/19 0812)  . sodium thiosulfate infusion for calciphylaxis     PRN Meds:.albuterol, oxyCODONE, traMADol  ASSESMENT:   *   Painless hematochezia in setting of constipation.  Several months of issues with diarrhea, constipation  is a recent issue. 05/14/2019 CTAP wo contrast: Stool in distended rectum, circumferential rectal wall thickening and associated haziness perirectal fat.  R/O stercoral colitis or other nonspecific proctitis. 05/15/2019 flex sig showing nonbleeding hemorrhoids.  Blood in the rectum and sigmoid, bleeding, solitary, distal rectal ulcer injected, bicapped and clipped (5 clips).  Ulcer consistent with stercoral ulcer.  *    ABL on chronic anemia.  Hgb stable after 2 U blood yesterday 10/26 near baseline levels.  Ferric gluconate 3 times weekly for 6 doses orders in place.  *   ESRD.  Hemodialysis since July 2020 Recent calciphylaxis.     PLAN   *    Future, outpatient colonoscopy.  Should follow up in office with Dr. Rush Landmark prior to this.  We will arrange our OV.  *    Discontinued the Rocephin and metronidazole.  This is not an infectious proctitis and does not require antibiotic therapy. Discontinue Cholestyramine?, which is constipating.  Not clear why he was on this, if for diarrhea, then stop, if for other indication would look for alternative.  If it was used for treating hypercholesterolemia, would look for alternative medication.  *    Advance to diabetic, renal diet w 1800 ml fluid    Azucena Freed  05/16/2019, 10:54 AM Phone [phone]

## 2019-05-16 NOTE — Progress Notes (Signed)
Placed patient on CPAP for the night with pressure set at [facility]

## 2019-05-16 NOTE — Progress Notes (Addendum)
Paged [facility] about patient's PTA meds tramodol which is staggered with oxy, helps better with pain control. Awaiting orders  0034- Order noted. Updated patient.

## 2019-05-16 NOTE — Progress Notes (Addendum)
[facility] Progress Note  Subjective: doing well, on HD now  Vitals:   05/16/19 1236 05/16/19 1300 05/16/19 1322 05/16/19 1330  BP: 106/68 95/64 112/74 (!) 147/102  Pulse: 92 64 96 70  Resp: 13     Temp:      TempSrc:      SpO2:      Weight:      Height:        Inpatient medications: . sodium chloride   Intravenous Once  . bethanechol  25 mg Oral TID  . Chlorhexidine Gluconate Cloth  6 each Topical Q0600  . cholestyramine  8 g Oral BID  . DULoxetine  40 mg Oral Daily  . Gerhardt's butt cream   Topical BID  . insulin aspart  0-5 Units Subcutaneous QHS  . insulin aspart  0-9 Units Subcutaneous TID WC  . Melatonin  4.5 mg Oral QHS  . midodrine  10 mg Oral Q M,W,F  . pantoprazole  40 mg Oral Daily  . tamsulosin  0.4 mg Oral QPC breakfast   . sodium chloride    . sodium chloride    . ferric gluconate (FERRLECIT/NULECIT) IV    . sodium thiosulfate infusion for calciphylaxis     sodium chloride, sodium chloride, albuterol, alteplase, heparin, lidocaine (PF), lidocaine-prilocaine, oxyCODONE, pentafluoroprop-tetrafluoroeth, traMADol    Exam: General: NAD, pale, chronically ill appearing male, laying in bed Head: NCAT sclera not icteric, slightly injected. MMM Neck: Supple. No lymphadenopathy Lungs: CTA bilaterally. No wheeze, rales or rhonchi. Breathing is unlabored. Heart: RRR. No murmur, rubs or gallops.  Abdomen: soft, nontender, +BS, no guarding, no rebound tenderness Lower extremities:no edema, L BKA, multiple calciphylaxis wounds on b/l thighs, R calf in ace wrap. Neuro: AAOx3. Moves all extremities spontaneously. Psych:  Responds to questions appropriately with a normal affect. Dialysis Access: R IJ TDC, LU AVF maturing  Dialysis Orders:  [facility] - [facility]  4hrs, BFR 400, DFR 800,  EDW 107kg, 2K/ 2.25Ca (should be 2K/2.0Ca bath)  Access: R IJ TDC, LU AVF maturing  Heparin 3000 units Mircera 100 mcg q2wks - last 10/16 Venofer 100mg  x10 - 2 of 10  completed Sodium thiosulfate 25mg  qHD  Assessment/Plan: 1.  Hematochezia - multiple bloody BM in ED.  HGB drop 9.2>8.4>6.5. 2units pBRC given 10/26, Hb 7.6 this am. Flex sig done 10/26 showed blood in rectum/ sigmoid and solitary rectal ulcer sp inject/bicap/ clipping per GI. Felt to be stercoral ulcer from recent constipation.  2.  ESRD -  On HD MWFS. On HD today rolled over from yest. Plan HD tomorrow to get back on schedule.  3.  Anemia of CKD - Continue iron load. Due for ESA on 10/30.  4.  Secondary Hyperparathyroidism -  Ca at goal. Will check phos.  NO calcium/Vit D because of calciphylaxis.  5.  Nutrition - Renal diet w/fluid restriction when no longer NPO 6. Calciphylaxis - recent issue, appears to be grossly improving. Cont Na thiosulfate, low Ca bath on HD (important), and avoid all Ca/ vit D products 7. DM - per primary     Rob Rania Prothero 05/16/2019, 1:55 PM  Iron/TIBC/Ferritin/ %Sat    Component Value Date/Time   IRON 19 (L) 03/04/2019 1013   TIBC 199 (L) 03/04/2019 1013   FERRITIN 244 03/04/2019 1013   IRONPCTSAT 10 (L) 03/04/2019 1013   Recent Labs  Lab 05/14/19 1323  05/16/19 0627  NA 138   < > 134*  K 4.7   < > 4.0  CL  103   < > 101  CO2 24  --  23  GLUCOSE 116*   < > 79  BUN 35*   < > 47*  CREATININE 6.04*   < > 7.57*  CALCIUM 8.3*  --  8.1*  ALBUMIN 2.0*  --   --    < > = values in this interval not displayed.   Recent Labs  Lab 05/14/19 1323  AST 26  ALT 12  ALKPHOS 72  BILITOT 1.1  PROT 7.1   Recent Labs  Lab 05/16/19 0627  WBC 8.7  HGB 7.6*  HCT 24.6*  PLT 295

## 2019-05-16 NOTE — Progress Notes (Signed)
PROGRESS NOTE    Michael Riggs  [name] DOB: 06/26/1971 DOA: 05/14/2019 PCP: [name], MD    Brief Narrative:  48 year old male with prior h/o ESRD on HD on MWF SAT, obesity, diet controlled DM, Hypertension, hyperlipidemia from SNF presents with rectal bleeding.  He was recently discharged from the hospital after being treated for sepsis from right lower extremity cellulitis/ calciphylaxis presents this time for rectal bleeding. Gastroenterology Dr Rush Landmark consulted and he underwent flex sigmoidoscopy on the evening of 10/26, was found to have a bleeding rectal ulcer, hemostasis was obtained with epinephrine injections and clips were placed. He received 2 units of prbc transfusion to keep his hemoglobin greater than 7. Meanwhile Nephrology consulted for HD.   Assessment & Plan:   Principal Problem:   Lower GI bleed Active Problems:   Diabetes mellitus type 2 in obese (HCC)   End stage renal disease ([place])   Controlled type 2 diabetes mellitus with hyperglycemia, without long-term current use of insulin (HCC)   Hypertension   Calciphylaxis   Pressure injury of skin   Rectal bleeding secondary to bleeding rectal ulcer. S/p flex sigmoidoscopy by gastroenterology on 10/26/201 A solitary rectal ulcer was found,  it was successfully injected with epinephrine for hemostasis along with 5 hemostatatic clips were placed. He underwent 2 units of prbc transfusion and H&H is >7.  Plan for follow-up colonoscopy in the future. Empiric antibiotics with Rocephin and Flagyl for  infec proctitis, discontinued at this time.  No more bleeding from the rectal ulcer.    Acute anemia of blood loss secondary to GI bleed superimposed on Anemia of chronic disease.  Transfuse to keep hemoglobin greater than 7.  Currently at 7.6 this am, no rectal bleeding today.   End-stage renal disease  on dialysis Monday Wednesday Friday and Saturday. Nephrology consulted. On Midodrine 10 mg on the  days of HD.     Diet-controlled diabetes mellitus Continue with sliding scale insulin while hospitalized. Last A1c is less than 6.5% CBG (last 3)  Recent Labs    05/16/19 0835 05/16/19 1121 05/16/19 1714  GLUCAP 125* 114* 74       Essential hypertension Well controlled.    GERD Stable on Protonix   Pressure ulcers:  Wound care consulted.       DVT prophylaxis: scd's. Code Status: full code Family Communication: none at bedside.  Disposition Plan: possible d/c back to SNF in am.    Consultants:   Gastroenterology  Nephrology.   Procedures:  Flex sigmoidoscopy on 10/26.   Antimicrobials: none.    Subjective:  Pain controlled. No nausea, vomiting or abdominal pain.  No rectal bleeding today.   Objective: Vitals:   05/15/19 2130 05/15/19 2315 05/16/19 0029 05/16/19 0352  BP: 104/72 115/71    Pulse:  68 78   Resp:  20 17   Temp:  98.4 F (36.9 C)  98.3 F (36.8 C)  TempSrc:  Oral  Oral  SpO2:  97% 97%   Weight:      Height:        Intake/Output Summary (Last 24 hours) at 05/16/2019 0932 Last data filed at 05/16/2019 0700 Gross per 24 hour  Intake 1520 ml  Output 100 ml  Net 1420 ml   Filed Weights   05/14/19 1308 05/15/19 1121  Weight: 107.5 kg 105.6 kg    Examination:  General exam: alert and comfortable.  Respiratory system: clear to auscultation, no wheezing or rhonchi.  Cardiovascular system: s1s2, RRR, no  JVD.  Gastrointestinal system: abd is soft, non tender non distended bowel sounds good.  Central nervous system: alert and oriented.  Extremities: left BKA. Right leg wrapped in bandage.  Skin: stage 2 sacral pressure ulcer.  Psychiatry: Mood is appropriate.     Data Reviewed: I have personally reviewed following labs and imaging studies  CBC: Recent Labs  Lab 05/14/19 1323 05/14/19 1801 05/15/19 0406 05/15/19 1227 05/15/19 2345 05/16/19 0627  WBC 12.4* 13.3* 9.4  --   --  8.7  NEUTROABS 10.0*  --   --   --    --   --   HGB 9.2* 8.4* 6.5* 7.8* 7.7* 7.6*  HCT 30.3* 27.9* 21.9* 23.0* 24.5* 24.6*  MCV 92.1 92.1 93.6  --   --  92.1  PLT 317 351 295  --   --  [id number]   Basic Metabolic Panel: Recent Labs  Lab 05/14/19 1323 05/15/19 0406 05/15/19 1227 05/16/19 0627  NA 138  --  138 134*  K 4.7  --  4.3 4.0  CL 103  --  104 101  CO2 24  --   --  23  GLUCOSE 116* 73 79 79  BUN 35*  --  46* 47*  CREATININE 6.04*  --  7.60* 7.57*  CALCIUM 8.3*  --   --  8.1*   GFR: Estimated Creatinine Clearance: 14.7 mL/min (A) (by C-G formula based on SCr of 7.57 mg/dL (H)). Liver Function Tests: Recent Labs  Lab 05/14/19 1323  AST 26  ALT 12  ALKPHOS 72  BILITOT 1.1  PROT 7.1  ALBUMIN 2.0*   Recent Labs  Lab 05/14/19 1323  LIPASE 40   No results for input(s): AMMONIA in the last 168 hours. Coagulation Profile: No results for input(s): INR, PROTIME in the last 168 hours. Cardiac Enzymes: No results for input(s): CKTOTAL, CKMB, CKMBINDEX, TROPONINI in the last 168 hours. BNP (last 3 results) No results for input(s): PROBNP in the last 8760 hours. HbA1C: No results for input(s): HGBA1C in the last 72 hours. CBG: Recent Labs  Lab 05/15/19 1142 05/15/19 1238 05/15/19 1604 05/16/19 0835  GLUCAP 66* 127* 74 125*   Lipid Profile: No results for input(s): CHOL, HDL, LDLCALC, TRIG, CHOLHDL, LDLDIRECT in the last 72 hours. Thyroid Function Tests: No results for input(s): TSH, T4TOTAL, FREET4, T3FREE, THYROIDAB in the last 72 hours. Anemia Panel: No results for input(s): VITAMINB12, FOLATE, FERRITIN, TIBC, IRON, RETICCTPCT in the last 72 hours. Sepsis Labs: Recent Labs  Lab 05/14/19 1801 05/14/19 2240  LATICACIDVEN 2.1* 1.1    Recent Results (from the past 240 hour(s))  SARS Coronavirus 2 by RT PCR (hospital order, performed in [facility] hospital lab) Nasopharyngeal Nasopharyngeal Swab     Status: None   Collection Time: 05/14/19  5:42 PM   Specimen: Nasopharyngeal Swab  Result Value  Ref Range Status   SARS Coronavirus 2 NEGATIVE NEGATIVE Final    Comment: (NOTE) If result is NEGATIVE SARS-CoV-2 target nucleic acids are NOT DETECTED. The SARS-CoV-2 RNA is generally detectable in upper and lower  respiratory specimens during the acute phase of infection. The lowest  concentration of SARS-CoV-2 viral copies this assay can detect is 250  copies / mL. A negative result does not preclude SARS-CoV-2 infection  and should not be used as the sole basis for treatment or other  patient management decisions.  A negative result may occur with  improper specimen collection / handling, submission of specimen other  than nasopharyngeal swab, presence  of viral mutation(s) within the  areas targeted by this assay, and inadequate number of viral copies  (<250 copies / mL). A negative result must be combined with clinical  observations, patient history, and epidemiological information. If result is POSITIVE SARS-CoV-2 target nucleic acids are DETECTED. The SARS-CoV-2 RNA is generally detectable in upper and lower  respiratory specimens dur ing the acute phase of infection.  Positive  results are indicative of active infection with SARS-CoV-2.  Clinical  correlation with patient history and other diagnostic information is  necessary to determine patient infection status.  Positive results do  not rule out bacterial infection or co-infection with other viruses. If result is PRESUMPTIVE POSTIVE SARS-CoV-2 nucleic acids MAY BE PRESENT.   A presumptive positive result was obtained on the submitted specimen  and confirmed on repeat testing.  While 2019 novel coronavirus  (SARS-CoV-2) nucleic acids may be present in the submitted sample  additional confirmatory testing may be necessary for epidemiological  and / or clinical management purposes  to differentiate between  SARS-CoV-2 and other Sarbecovirus currently known to infect humans.  If clinically indicated additional testing with an  alternate test  methodology [phone]) is advised. The SARS-CoV-2 RNA is generally  detectable in upper and lower respiratory sp ecimens during the acute  phase of infection. The expected result is Negative. Fact Sheet for Patients:  StrictlyIdeas.no Fact Sheet for Healthcare Providers: BankingDealers.co.za This test is not yet approved or cleared by the [place] FDA and has been authorized for detection and/or diagnosis of SARS-CoV-2 by FDA under an Emergency Use Authorization (EUA).  This EUA will remain in effect (meaning this test can be used) for the duration of the COVID-19 declaration under Section 564(b)(1) of the Act, 21 U.S.C. section 360bbb-3(b)(1), unless the authorization is terminated or revoked sooner. Performed at [facility], [place] [address], [place],  [address]          Radiology Studies: Ct Abdomen Pelvis Wo Contrast  Result Date: 05/14/2019 CLINICAL DATA:  Rectal bleeding this morning. Constipation. Abdominal distension. EXAM: CT ABDOMEN AND PELVIS WITHOUT CONTRAST TECHNIQUE: Multidetector CT imaging of the abdomen and pelvis was performed following the standard protocol without IV contrast. COMPARISON:  04/27/2019 abdominal radiograph. 02/11/2019 CT abdomen/pelvis. FINDINGS: Lower chest: Small to moderate bilateral dependent pleural effusions with associated dependent bibasilar atelectasis. Coronary atherosclerosis. Trace pericardial effusion/thickening. Hepatobiliary: Normal liver size. No liver mass. Cholelithiasis. No significant gallbladder distention. No gallbladder wall thickening. No pericholecystic fluid. No biliary ductal dilatation. Pancreas: Normal, with no mass or duct dilation. Spleen: Normal size. No mass. Adrenals/Urinary Tract: Normal adrenals. No hydronephrosis. No renal stones. No contour deforming renal masses. Nondistended bladder with questionable diffuse bladder wall thickening.  Stomach/Bowel: Normal non-distended stomach. Normal caliber small bowel with no small bowel wall thickening. Normal appendix. Rectum moderately distended by stool. Mild circumferential rectal wall thickening is new with mild haziness of perirectal fat. Otherwise unremarkable large bowel with no diverticulosis. Vascular/Lymphatic: Atherosclerotic nonaneurysmal abdominal aorta. Mild bilateral external iliac lymphadenopathy measuring up to 1.0 cm on the left (series 8/image 86) and 1.2 cm on the right (series 8/image 85), stable since 02/11/2019 CT. No new pathologically enlarged abdominopelvic nodes. Reproductive: Atrophic appearing prostate. Other: No pneumoperitoneum, ascites or focal fluid collection. Musculoskeletal: No aggressive appearing focal osseous lesions. Moderate thoracolumbar spondylosis. IMPRESSION: 1. Rectum moderately distended by stool. New mild circumferential rectal wall thickening with mild haziness of perirectal fat, cannot exclude stercoral colitis or other nonspecific proctitis. 2. Small to moderate bilateral  dependent pleural effusions with associated dependent bibasilar atelectasis. Trace pericardial effusion/thickening. 3. Cholelithiasis. 4. Stable mild bilateral external iliac lymphadenopathy, nonspecific, presumably reactive. 5. Coronary atherosclerosis. 6. Aortic Atherosclerosis (ICD10-I70.0). Electronically Signed   By: Ilona Sorrel M.D.   On: 05/14/2019 17:11   Dg Abd 2 Views  Result Date: 05/15/2019 CLINICAL DATA:  Lower GI bleed. EXAM: ABDOMEN - 2 VIEW COMPARISON:  CT scan 05/14/2019 FINDINGS: Supine and decubitus views of the abdomen show no gaseous bowel dilatation to suggest obstruction. No non dependent free gas visible on the decubitus film to suggest perforation. Visualized portions of the lower lungs appear clear with tiny bilateral pleural effusions. IMPRESSION: Negative. Electronically Signed   By: Misty Stanley M.D.   On: 05/15/2019 10:39        Scheduled  Meds: . sodium chloride   Intravenous Once  . bethanechol  25 mg Oral TID  . Chlorhexidine Gluconate Cloth  6 each Topical Q0600  . cholestyramine  8 g Oral BID  . DULoxetine  40 mg Oral Daily  . Gerhardt's butt cream   Topical BID  . Melatonin  4.5 mg Oral QHS  . midodrine  10 mg Oral Q M,W,F  . pantoprazole  40 mg Oral Daily  . tamsulosin  0.4 mg Oral QPC breakfast   Continuous Infusions: . cefTRIAXone (ROCEPHIN)  IV Stopped (05/15/19 1857)  . ferric gluconate (FERRLECIT/NULECIT) IV    . metronidazole 500 mg (05/16/19 0812)  . sodium thiosulfate infusion for calciphylaxis       LOS: 2 days      Hosie Poisson, MD [facility] Pager [phone]  If 7PM-7AM, please contact night-coverage [email or web address] Password [facility] 05/16/2019, 9:32 AM

## 2019-05-16 NOTE — Progress Notes (Signed)
CSW unable to complete assessment- patient is off the unit at dialysis.   Thurmond Butts, MSW, [place] Social Worker [phone]

## 2019-05-16 NOTE — Care Management Important Message (Signed)
Important Message  Patient Details  Name: Michael Riggs MRN: [id number] Date of Birth: 1970/10/23   Medicare Important Message Given:  Yes     Memory Argue 05/16/2019, 1:54 PM    PATIENT HAS SIGNED IM

## 2019-05-16 NOTE — Procedures (Signed)
   I was present at this dialysis session, have reviewed the session itself and made  appropriate changes Rob Emmi Wertheim MD [facility] pager [phone]   05/16/2019, 2:03 PM    

## 2019-05-16 NOTE — Consult Note (Signed)
[place] Nurse wound consult note Reason for Consult: re-consulted for partial thickness tissue loss to bilateral gluteal folds,  GI bleed with rectal bleeding Gerhardts butt paste has been ordered.  Bleeding has been controlled.  Skin should be kept clean and dry. No disposable briefs or underpads.  Dermatherapy linen and barrier paste should improve skin microclimate and promote healing Wound type: moisture and friction/shear to buttocks.   Pressure Injury POA: Yes Measurement: 3  cm x 1 cm x 0.2 cm each gluteal fold.  Wound [id number] and moist Drainage (amount, consistency, odor) minimal serosanguinous  weeping Periwound: Calciphylaxis lesions to legs and posterior thigh at gluteal fold.  Dressing procedure/placement/frequency:Gerhardts butt paste twice daily.  No disposable briefs or underpads.  Will not follow at this time.  Please re-consult if needed.  Domenic Moras MSN, RN, FNP-BC CWON Wound, Ostomy, Continence Nurse Pager [phone]

## 2019-05-17 ENCOUNTER — Telehealth: Payer: Self-pay

## 2019-05-17 DIAGNOSIS — I9589 Other hypotension: Secondary | ICD-10-CM

## 2019-05-17 DIAGNOSIS — L8990 Pressure ulcer of unspecified site, unspecified stage: Secondary | ICD-10-CM

## 2019-05-17 LAB — HEMOGLOBIN AND HEMATOCRIT, BLOOD
HCT: 21.4 % — ABNORMAL LOW (ref 39.0–52.0)
HCT: 26.4 % — ABNORMAL LOW (ref 39.0–52.0)
Hemoglobin: 6.7 g/dL — CL (ref 13.0–17.0)
Hemoglobin: 8.7 g/dL — ABNORMAL LOW (ref 13.0–17.0)

## 2019-05-17 LAB — GLUCOSE, CAPILLARY
Glucose-Capillary: 93 mg/dL (ref 70–99)
Glucose-Capillary: 100 mg/dL — ABNORMAL HIGH (ref 70–99)
Glucose-Capillary: 104 mg/dL — ABNORMAL HIGH (ref 70–99)
Glucose-Capillary: 139 mg/dL — ABNORMAL HIGH (ref 70–99)

## 2019-05-17 LAB — PREPARE RBC (CROSSMATCH)

## 2019-05-17 LAB — RETICULOCYTES
Immature Retic Fract: 19.3 % — ABNORMAL HIGH (ref 2.3–15.9)
RBC.: 2.33 MIL/uL — ABNORMAL LOW (ref 4.22–5.81)
Retic Count, Absolute: 44 10*3/uL (ref 19.0–186.0)
Retic Ct Pct: 1.9 % (ref 0.4–3.1)

## 2019-05-17 LAB — IRON AND TIBC
Iron: 19 ug/dL — ABNORMAL LOW (ref 45–182)
Saturation Ratios: 15 % — ABNORMAL LOW (ref 17.9–39.5)
TIBC: 127 ug/dL — ABNORMAL LOW (ref 250–450)
UIBC: 108 ug/dL

## 2019-05-17 LAB — GLUCOSE, RANDOM: Glucose, Bld: 95 mg/dL (ref 70–99)

## 2019-05-17 LAB — FOLATE: Folate: 10.1 ng/mL (ref >5.9)

## 2019-05-17 LAB — FERRITIN: Ferritin: 205 ng/mL (ref 24–336)

## 2019-05-17 LAB — VITAMIN B12: Vitamin B-12: 316 pg/mL (ref 180–914)

## 2019-05-17 MED ORDER — HEPARIN SODIUM (PORCINE) 1000 UNIT/ML IJ SOLN
INTRAMUSCULAR | Status: AC
  Administered 2019-05-17: 12:00:00 3800 [IU] via INTRAVENOUS_CENTRAL
  Filled 2019-05-17: qty 4

## 2019-05-17 MED ORDER — OXYCODONE HCL 5 MG PO TABS
ORAL_TABLET | ORAL | Status: AC
  Administered 2019-05-17: 5 mg via ORAL
  Filled 2019-05-17: qty 1

## 2019-05-17 MED ORDER — SODIUM CHLORIDE 0.9% IV SOLUTION
Freq: Once | INTRAVENOUS | Status: AC
  Administered 2019-05-17: 13:00:00 via INTRAVENOUS

## 2019-05-17 MED ORDER — HEPARIN SODIUM (PORCINE) 1000 UNIT/ML DIALYSIS
3000.0000 [IU] | Freq: Once | INTRAMUSCULAR | Status: AC
  Administered 2019-05-17: 09:00:00 3000 [IU] via INTRAVENOUS_CENTRAL
  Administered 2019-05-17: 12:00:00 3800 [IU] via INTRAVENOUS_CENTRAL

## 2019-05-17 MED ORDER — HEPARIN SODIUM (PORCINE) 1000 UNIT/ML IJ SOLN
INTRAMUSCULAR | Status: AC
  Administered 2019-05-17: 3000 [IU] via INTRAVENOUS_CENTRAL
  Filled 2019-05-17: qty 3

## 2019-05-17 MED ORDER — ONDANSETRON HCL 4 MG/2ML IJ SOLN
4.0000 mg | Freq: Four times a day (QID) | INTRAMUSCULAR | Status: DC | PRN
  Administered 2019-05-17: 4 mg via INTRAVENOUS
  Filled 2019-05-17: qty 2

## 2019-05-17 NOTE — Progress Notes (Signed)
Received a call from lab at 09:28 about critical lab HGB 6.7. Informed lab that pt was in dialysis. Lab tech stated she would call dialysis to report critical lab value.

## 2019-05-17 NOTE — Progress Notes (Signed)
[facility] Progress Note  Subjective: doing well, hb 6.7 > ordered 2u prbc's w/ HD  Vitals:   05/17/19 1151 05/17/19 1200 05/17/19 1206 05/17/19 1247  BP: 136/74 (!) 142/85 (!) 142/85 127/81  Pulse: 100 (!) 101 (!) 101 (!) 101  Resp: 18 18 18 14   Temp: 98.8 F (37.1 C)  99 F (37.2 C) 98.4 F (36.9 C)  TempSrc: Oral  Oral Oral  SpO2:   96% 94%  Weight:   107.5 kg   Height:        Inpatient medications: . sodium chloride   Intravenous Once  . bethanechol  25 mg Oral TID  . Chlorhexidine Gluconate Cloth  6 each Topical Q0600  . Chlorhexidine Gluconate Cloth  6 each Topical Q0600  . cholestyramine  8 g Oral BID  . DULoxetine  40 mg Oral Daily  . Gerhardt's butt cream   Topical BID  . insulin aspart  0-5 Units Subcutaneous QHS  . insulin aspart  0-9 Units Subcutaneous TID WC  . Melatonin  4.5 mg Oral QHS  . midodrine  10 mg Oral Q M,W,F  . pantoprazole  40 mg Oral Daily  . tamsulosin  0.4 mg Oral QPC breakfast   . ferric gluconate (FERRLECIT/NULECIT) IV    . sodium thiosulfate infusion for calciphylaxis     albuterol, oxyCODONE, traMADol    Exam: General: NAD, pale, chronically ill appearing male, laying in bed Head: NCAT sclera not icteric, slightly injected. MMM Neck: Supple. No lymphadenopathy Lungs: CTA bilaterally. No wheeze, rales or rhonchi. Breathing is unlabored. Heart: RRR. No murmur, rubs or gallops.  Abdomen: soft, nontender, +BS, no guarding, no rebound tenderness Lower extremities:no edema, L BKA, multiple calciphylaxis wounds on b/l thighs, R calf in ace wrap. Neuro: AAOx3. Moves all extremities spontaneously. Psych:  Responds to questions appropriately with a normal affect. Dialysis Access: R IJ TDC, LU AVF maturing  Dialysis Orders:  MWFS - [facility]  4hrs, BFR 400, DFR 800,  EDW 107kg, 2K/ 2.25Ca (should be 2K/2.0Ca bath at dc)  Access: R IJ TDC, LU AVF maturing  Heparin 3000 units Mircera 100 mcg q2wks - last 10/16 Venofer 100mg   x10 - 2 of 10 completed Sodium thiosulfate 25mg  qHD  Assessment/Plan: 1.  Hematochezia - multiple bloody BM in ED.  HGB drop 9.2>8.4>6.5. 2units pBRC given 10/26. Hb 6.7 this am, ordered another 2u prbc's.  Flex sig done 10/26 showed blood in rectum/ sigmoid and solitary rectal ulcer sp inject/bicap/ clipping per GI. Felt to be stercoral ulcer from constipation.  2.  ESRD -  On HD MWFS. HD today 3.  Anemia of CKD - Continue iron load. Due for ESA on 10/30.  4.  Secondary Hyperparathyroidism -  Ca at goal. Will check phos.  NO calcium/Vit D because of calciphylaxis.  5.  Nutrition - Renal diet w/fluid restriction when no longer NPO 6. Calciphylaxis - recent issue, appears to be grossly improving. Cont Na thiosulfate, low Ca bath on HD (important), and avoid all Ca/ vit D products 7. DM - per primary     Rob Janalee Grobe 05/17/2019, 2:21 PM  Iron/TIBC/Ferritin/ %Sat    Component Value Date/Time   IRON 19 (L) 05/17/2019 0830   TIBC 127 (L) 05/17/2019 0830   FERRITIN 205 05/17/2019 0830   IRONPCTSAT 15 (L) 05/17/2019 0830   Recent Labs  Lab 05/14/19 1323  05/16/19 0627 05/17/19 0234  NA 138   < > 134*  --   K 4.7   < >  4.0  --   CL 103   < > 101  --   CO2 24  --  23  --   GLUCOSE 116*   < > 79 95  BUN 35*   < > 47*  --   CREATININE 6.04*   < > 7.57*  --   CALCIUM 8.3*  --  8.1*  --   ALBUMIN 2.0*  --   --   --    < > = values in this interval not displayed.   Recent Labs  Lab 05/14/19 1323  AST 26  ALT 12  ALKPHOS 72  BILITOT 1.1  PROT 7.1   Recent Labs  Lab 05/16/19 0627 05/17/19 0830  WBC 8.7  --   HGB 7.6* 6.7*  HCT 24.6* 21.4*  PLT 295  --

## 2019-05-17 NOTE — Procedures (Signed)
   I was present at this dialysis session, have reviewed the session itself and made  appropriate changes Kelly Splinter MD [place] pager [phone]   05/17/2019, 2:23 PM

## 2019-05-17 NOTE — Progress Notes (Signed)
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   Gastroenterology Dr Rush Landmark consulted and he underwent flex sigmoidoscopy on the evening of 10/26, was found to have a bleeding rectal ulcer, hemostasis was obtained with epinephrine injections and clips were placed. He received 2 units of prbc transfusion to keep his hemoglobin greater than 7. Meanwhile Nephrology consulted for HD.   In ED, hemodynamically stable.  Hgb 9.2.  CT abdomen and pelvis revealed rectal wall thickening.  GI consulted.  Empirically started on ceftriaxone and Flagyl for possible proctitis.  Subjective: No major events overnight of this morning.  Currently on HD.  No complaints.  He denies chest pain, dyspnea, palpitation, dizziness, nausea, vomiting or abdominal pain.  Objective: Vitals:   05/17/19 1151 05/17/19 1200 05/17/19 1206 05/17/19 1247  BP: 136/74 (!) 142/85 (!) 142/85 127/81  Pulse: 100 (!) 101 (!) 101 (!) 101  Resp: 18 18 18 14   Temp: 98.8 F (37.1 C)  99 F (37.2 C) 98.4 F (36.9 C)  TempSrc: Oral  Oral Oral  SpO2:   96% 94%  Weight:   107.5 kg   Height:        Intake/Output Summary (Last 24 hours) at 05/17/2019 1516 Last data filed at 05/17/2019 1206 Gross per 24 hour  Intake 1090 ml  Output 1000 ml  Net 90 ml   Filed Weights   05/16/19 1632 05/17/19 0826 05/17/19 1206  Weight: 105.9 kg 108.3 kg 107.5 kg    Examination:  GENERAL: No acute distress.  Appears well.  HEENT: MMM.  Vision and hearing grossly intact.  NECK: Supple.  No apparent JVD.  RESP:  No IWOB. Good air movement bilaterally. CVS:  RRR. Heart sounds normal.  ABD/GI/GU: Bowel sounds present. Soft. Non  tender.  MSK/EXT: Left BKA.   SKIN: Calciphylaxis/skin ulceration over bilateral lower extremities NEURO: Awake, alert and oriented appropriately.  No gross deficit.  PSYCH: Calm. Normal affect.   Assessment & Plan: Acute blood loss anemia due to rectal bleed from bleeding rectal ulcer superimposed on anemia of chronic disease.  Baseline Hgb 8-9. 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  Diet controlled diabetes CBG (last 3)  Recent Labs    05/17/19 0725 05/17/19 1244 05/17/19 1606  GLUCAP 93 100* 104*  -Continue current regimen  Chronic hypotension/essential hypertension -Continue midodrine with HD.  Hyperlipidemia -Continue home statin  Depression/neuropathic pain -Continue Cymbalta, tramadol and as needed oxycodone  BPH/LUTS -Continue tamsulosin and Urecholine  GERD -Continue PPI  Prolonged QTC -Minimize QT prolonging drugs.  Calciphylaxis/lower extremity wound/stage II sacral decubitus Pressure Injury 02/27/19 Penis WOC evaluation day of PIP data, this wound is trauma related to [facility] insertions and removals and documented abnormal male penile tissue per urology (Active)  02/27/19 1000  Location: Penis  Location Orientation:   Staging:   Wound Description (Comments): WOC evaluation day of PIP data, this wound is trauma related to [facility] insertions and removals and documented abnormal male penile tissue per urology  Present on Admission: No  Pressure Injury 04/26/19 Bilateral mutilple wounds on bilateral buttock (mid and inner) (Active)  04/26/19 2319  Location:   Location Orientation: Bilateral  Staging:   Wound Description (Comments): mutilple wounds on bilateral buttock (mid and inner)  Present on Admission:      Pressure  Injury 04/26/19 Left Stage II -  Partial thickness loss of dermis presenting as a shallow open ulcer with a red, pink wound bed without slough. (Active)  04/26/19 2321  Location:   Location Orientation: Left  Staging: Stage II -  Partial thickness loss of dermis presenting as a shallow open ulcer with a red, pink wound bed without slough.  Wound Description (Comments):   Present on Admission:              DVT prophylaxis: None due to GI bleed and BKA and lower extremity ulceration Code Status: Full code Family Communication: Patient and/or RN. Available if any question. Disposition Plan: Remains inpatient.  Hemoglobin dropped 1 g overnight.  Consultants: Nephrology, GI  Procedures:  10/26-Flex sigmoidoscopy with epinephrine injection and placement of clips for hemostasis  Microbiology summarized: COVID-19 negative.  Sch Meds:  Scheduled Meds: . sodium chloride   Intravenous Once  . bethanechol  25 mg Oral TID  . Chlorhexidine Gluconate Cloth  6 each Topical Q0600  . Chlorhexidine Gluconate Cloth  6 each Topical Q0600  . cholestyramine  8 g Oral BID  . DULoxetine  40 mg Oral Daily  . Gerhardt's butt cream   Topical BID  . insulin aspart  0-5 Units Subcutaneous QHS  . insulin aspart  0-9 Units Subcutaneous TID WC  . Melatonin  4.5 mg Oral QHS  . midodrine  10 mg Oral Q M,W,F  . pantoprazole  40 mg Oral Daily  . tamsulosin  0.4 mg Oral QPC breakfast   Continuous Infusions: . ferric gluconate (FERRLECIT/NULECIT) IV    . sodium thiosulfate infusion for calciphylaxis     PRN Meds:.albuterol, oxyCODONE, traMADol  Antimicrobials: Anti-infectives (From admission, onward)   Start     Dose/Rate Route Frequency Ordered Stop   05/15/19 1000  cefTRIAXone (ROCEPHIN) 2 g in sodium chloride 0.9 % 100 mL IVPB  Status:  Discontinued     2 g 200 mL/hr over 30 Minutes Intravenous Daily 05/14/19 2206 05/16/19 1100   05/15/19 0230  piperacillin-tazobactam (ZOSYN) IVPB 2.25 g  Status:   Discontinued     2.25 g 100 mL/hr over 30 Minutes Intravenous Every 8 hours 05/14/19 1815 05/14/19 2206   05/14/19 2215  metroNIDAZOLE (FLAGYL) IVPB 500 mg  Status:  Discontinued     500 mg 100 mL/hr over 60 Minutes Intravenous Every 8 hours 05/14/19 2206 05/16/19 1100   05/14/19 1830  piperacillin-tazobactam (ZOSYN) IVPB 3.375 g     3.375 g 100 mL/hr over 30 Minutes Intravenous  Once 05/14/19 1815 05/14/19 2000       I have personally reviewed the following labs and images: CBC: Recent Labs  Lab 05/14/19 1323 05/14/19 1801 05/15/19 0406 05/15/19 1227 05/15/19 2345 05/16/19 0627 05/17/19 0830  WBC 12.4* 13.3* 9.4  --   --  8.7  --   NEUTROABS 10.0*  --   --   --   --   --   --   HGB 9.2* 8.4* 6.5* 7.8* 7.7* 7.6* 6.7*  HCT 30.3* 27.9* 21.9* 23.0* 24.5* 24.6* 21.4*  MCV 92.1 92.1 93.6  --   --  92.1  --   PLT 317 351 295  --   --  295  --    BMP &GFR Recent Labs  Lab 05/14/19 1323 05/15/19 0406 05/15/19 1227 05/16/19 0627 05/17/19 0234  NA 138  --  138 134*  --   K 4.7  --  4.3 4.0  --   CL 103  --  104 101  --   CO2 24  --   --  23  --   GLUCOSE 116* 73 79 79 95  BUN 35*  --  46* 47*  --   CREATININE 6.04*  --  7.60* 7.57*  --   CALCIUM 8.3*  --   --  8.1*  --    Estimated Creatinine Clearance: 14.8 mL/min (A) (by C-G formula based on SCr of 7.57 mg/dL (H)). Liver & Pancreas: Recent Labs  Lab 05/14/19 1323  AST 26  ALT 12  ALKPHOS 72  BILITOT 1.1  PROT 7.1  ALBUMIN 2.0*   Recent Labs  Lab 05/14/19 1323  LIPASE 40   No results for input(s): AMMONIA in the last 168 hours. Diabetic: Recent Labs    05/16/19 0627  HGBA1C 5.3   Recent Labs  Lab 05/16/19 1121 05/16/19 1714 05/16/19 2145 05/17/19 0725 05/17/19 1244  GLUCAP 114* 75 94 93 100*   Cardiac Enzymes: No results for input(s): CKTOTAL, CKMB, CKMBINDEX, TROPONINI in the last 168 hours. No results for input(s): PROBNP in the last 8760 hours. Coagulation Profile: No results for  input(s): INR, PROTIME in the last 168 hours. Thyroid Function Tests: No results for input(s): TSH, T4TOTAL, FREET4, T3FREE, THYROIDAB in the last 72 hours. Lipid Profile: No results for input(s): CHOL, HDL, LDLCALC, TRIG, CHOLHDL, LDLDIRECT in the last 72 hours. Anemia Panel: Recent Labs    05/17/19 0830  VITAMINB12 316  FOLATE 10.1  FERRITIN 205  TIBC 127*  IRON 19*  RETICCTPCT 1.9   Urine analysis:    Component Value Date/Time   COLORURINE YELLOW 03/10/2019 1054   APPEARANCEUR TURBID (A) 03/10/2019 1054   LABSPEC 1.020 03/10/2019 1054   PHURINE 5.0 03/10/2019 1054   GLUCOSEU 50 (A) 03/10/2019 1054   HGBUR SMALL (A) 03/10/2019 1054   BILIRUBINUR NEGATIVE 03/10/2019 [place] 03/10/2019 1054   PROTEINUR >=300 (A) 03/10/2019 1054   NITRITE NEGATIVE 03/10/2019 1054   LEUKOCYTESUR LARGE (A) 03/10/2019 1054   Sepsis Labs: Invalid input(s): PROCALCITONIN, [place]  Microbiology: Recent Results (from the past 240 hour(s))  SARS Coronavirus 2 by RT PCR (hospital order, performed in [facility] hospital lab) Nasopharyngeal Nasopharyngeal Swab     Status: None   Collection Time: 05/14/19  5:42 PM   Specimen: Nasopharyngeal Swab  Result Value Ref Range Status   SARS Coronavirus 2 NEGATIVE NEGATIVE Final    Comment: (NOTE) If result is NEGATIVE SARS-CoV-2 target nucleic acids are NOT DETECTED. The SARS-CoV-2 RNA is generally detectable in upper and lower  respiratory specimens during the acute phase of infection. The lowest  concentration of SARS-CoV-2 viral copies this assay can detect is 250  copies / mL. A negative result does not preclude SARS-CoV-2 infection  and should not be used as the sole basis for treatment or other  patient management decisions.  A negative result may occur with  improper specimen collection / handling, submission of specimen other  than nasopharyngeal swab, presence of viral mutation(s) within the  areas targeted by this  assay, and inadequate number of viral copies  (<250 copies / mL). A negative result must be combined with clinical  observations, patient history, and  epidemiological information. If result is POSITIVE SARS-CoV-2 target nucleic acids are DETECTED. The SARS-CoV-2 RNA is generally detectable in upper and lower  respiratory specimens dur ing the acute phase of infection.  Positive  results are indicative of active infection with SARS-CoV-2.  Clinical  correlation with patient history and other diagnostic information is  necessary to determine patient infection status.  Positive results do  not rule out bacterial infection or co-infection with other viruses. If result is PRESUMPTIVE POSTIVE SARS-CoV-2 nucleic acids MAY BE PRESENT.   A presumptive positive result was obtained on the submitted specimen  and confirmed on repeat testing.  While 2019 novel coronavirus  (SARS-CoV-2) nucleic acids may be present in the submitted sample  additional confirmatory testing may be necessary for epidemiological  and / or clinical management purposes  to differentiate between  SARS-CoV-2 and other Sarbecovirus currently known to infect humans.  If clinically indicated additional testing with an alternate test  methodology [phone]) is advised. The SARS-CoV-2 RNA is generally  detectable in upper and lower respiratory sp ecimens during the acute  phase of infection. The expected result is Negative. Fact Sheet for Patients:  StrictlyIdeas.no Fact Sheet for Healthcare Providers: BankingDealers.co.za This test is not yet approved or cleared by the [place] FDA and has been authorized for detection and/or diagnosis of SARS-CoV-2 by FDA under an Emergency Use Authorization (EUA).  This EUA will remain in effect (meaning this test can be used) for the duration of the COVID-19 declaration under Section 564(b)(1) of the Act, 21 U.S.C. section 360bbb-3(b)(1),  unless the authorization is terminated or revoked sooner. Performed at [facility], [place] [address] [place], [place] [address]     Radiology Studies: No results found.  35 minutes with more than 50% spent in reviewing records, counseling patient and coordinating care.  Eben Choinski T. [place]  If 7PM-7AM, please contact night-coverage [email or web address] Password [email or web address] 05/17/2019, 3:16 PM

## 2019-05-17 NOTE — Plan of Care (Signed)

## 2019-05-17 NOTE — Telephone Encounter (Signed)
-----   Message from Irving Copas., MD sent at 05/17/2019  3:43 AM EDT ----- Regarding: RE: follow up appt Thanks Clarise Cruz. Agree with follow up in time period noted. Thanks. GM ----- Message ----- From: Clearence Cheek Sent: 05/16/2019   3:43 PM EDT To: Timothy Lasso, RN, Irving Copas., MD Subject: follow up appt                                 Hi.  This pt needs fup with Dr Jerilynn Mages or Anderson Malta PA.  Timing about 3 to 4 weeks, though he is at [facility] rehab currently and may need to delay a bit longer than that time frame.   Fup of rectal ulcer and bleeding, diarrhea, constipation. Thanks, Judson Roch

## 2019-05-17 NOTE — Progress Notes (Signed)
Talked with dialysis nurse about meds on MAR ferric gluconate and sodium thiosulfate that show not given. She stated she had no time to give due to the pt needing to be given 2 units of RBC's. Pt has now been on floor since 1230. Called pharmacy to verify if meds are okay to give on floor, pharmacy states meds should be given in dialysis and to contact the provider. Nephrologist Dr. Jonnie Finner is aware of situation.

## 2019-05-17 NOTE — Progress Notes (Signed)
Giving dialysis dosed ferric gluconate and thiosulfate per Dr. Jonnie Finner request.

## 2019-05-17 NOTE — Telephone Encounter (Signed)
-----   Message from Irving Copas., MD sent at 05/17/2019  3:43 AM EDT ----- Regarding: RE: follow up appt Thanks Clarise Cruz. Agree with follow up in time period noted. Thanks. GM ----- Message ----- From: Clearence Cheek Sent: 05/16/2019   3:43 PM EDT To: Timothy Lasso, RN, Irving Copas., MD Subject: follow up appt                                 Hi.  This pt needs fup with Dr Jerilynn Mages or Anderson Malta PA.  Timing about 3 to 4 weeks, though he is at [facility] rehab currently and may need to delay a bit longer than that time frame.   Fup of rectal ulcer and bleeding, diarrhea, constipation. Thanks, Judson Roch

## 2019-05-17 NOTE — Telephone Encounter (Signed)
Appt made for 12/1 at 3:10 pm

## 2019-05-17 NOTE — Progress Notes (Signed)
PT Cancellation Note  Patient Details Name: Michael Riggs MRN: [id number] DOB: [birth year]   Cancelled Treatment:    Reason Eval/Treat Not Completed: Patient declined, no reason specified.   Back from HD. 05/17/2019  [name], PT Acute Rehabilitation Services [phone]  (pager) [phone]  (office)   [name] 05/17/2019, 6:44 PM

## 2019-05-18 LAB — GLUCOSE, CAPILLARY
Glucose-Capillary: 86 mg/dL (ref 70–99)
Glucose-Capillary: 89 mg/dL (ref 70–99)
Glucose-Capillary: 101 mg/dL — ABNORMAL HIGH (ref 70–99)
Glucose-Capillary: 104 mg/dL — ABNORMAL HIGH (ref 70–99)

## 2019-05-18 LAB — BPAM RBC
Blood Product Expiration Date: 202011272359
Blood Product Expiration Date: 202011272359
Blood Product Expiration Date: 202011292359
Blood Product Expiration Date: 202012022359
ISSUE DATE / TIME: 202010260914
ISSUE DATE / TIME: 202010261326
ISSUE DATE / TIME: 202010281041
ISSUE DATE / TIME: 202010281041
Unit Type and Rh: 5100
Unit Type and Rh: 5100
Unit Type and Rh: 5100
Unit Type and Rh: 5100

## 2019-05-18 LAB — TYPE AND SCREEN
ABO/RH(D): O POS
Antibody Screen: NEGATIVE
Unit division: 0
Unit division: 0
Unit division: 0
Unit division: 0

## 2019-05-18 LAB — MAGNESIUM: Magnesium: 1.8 mg/dL (ref 1.7–2.4)

## 2019-05-18 LAB — GLUCOSE, RANDOM: Glucose, Bld: 70 mg/dL (ref 70–99)

## 2019-05-18 LAB — RENAL FUNCTION PANEL
Albumin: 2.1 g/dL — ABNORMAL LOW (ref 3.5–5.0)
Anion gap: 15 (ref 5–15)
BUN: 13 mg/dL (ref 6–20)
CO2: 24 mmol/L (ref 22–32)
Calcium: 7.5 mg/dL — ABNORMAL LOW (ref 8.9–10.3)
Chloride: 97 mmol/L — ABNORMAL LOW (ref 98–111)
Creatinine, Ser: 3.32 mg/dL — ABNORMAL HIGH (ref 0.61–1.24)
GFR calc Af Amer: 24 mL/min — ABNORMAL LOW (ref >60)
GFR calc non Af Amer: 21 mL/min — ABNORMAL LOW (ref >60)
Glucose, Bld: 72 mg/dL (ref 70–99)
Phosphorus: 3 mg/dL (ref 2.5–4.6)
Potassium: 3.6 mmol/L (ref 3.5–5.1)
Sodium: 136 mmol/L (ref 135–145)

## 2019-05-18 LAB — HEMOGLOBIN AND HEMATOCRIT, BLOOD
HCT: 27 % — ABNORMAL LOW (ref 39.0–52.0)
Hemoglobin: 8.8 g/dL — ABNORMAL LOW (ref 13.0–17.0)

## 2019-05-18 MED ORDER — TRAMADOL HCL 50 MG PO TABS: 50.0000 mg | ORAL_TABLET | Freq: Three times a day (TID) | ORAL | 0 refills | Status: DC | PRN

## 2019-05-18 MED ORDER — CHLORHEXIDINE GLUCONATE CLOTH 2 % EX PADS
6.0000 | MEDICATED_PAD | Freq: Every day | CUTANEOUS | Status: DC
  Administered 2019-05-18 – 2019-05-19 (×2): 6 via TOPICAL

## 2019-05-18 MED ORDER — SENNOSIDES-DOCUSATE SODIUM 8.6-50 MG PO TABS: 1.0000 | ORAL_TABLET | Freq: Every evening | ORAL | Status: DC | PRN

## 2019-05-18 MED ORDER — POTASSIUM CHLORIDE CRYS ER 20 MEQ PO TBCR
20.0000 meq | EXTENDED_RELEASE_TABLET | Freq: Two times a day (BID) | ORAL | Status: DC
  Administered 2019-05-18 – 2019-05-19 (×2): 20 meq via ORAL
  Filled 2019-05-18 (×2): qty 1

## 2019-05-18 NOTE — Evaluation (Signed)
Physical Therapy Evaluation Patient Details Name: Michael Riggs MRN: [id number] DOB: 06-19-1971 Today's Date: 05/18/2019   History of Present Illness  48 y.o. male with medical history significant for esrd recently started on hemodialysis (m/w/f/sat), dm diet controlled, left BKA, obesity, HTN, calciphylaxis who presented 05/14/19 to ED with rectal bleeding. Discharged earlier this month after hospital course for LE infection c/b sepsis and code blue with intubation, icu stay, use of pressors. Discharged to rehab. 05/15/19 flex sigmoidoscopy found rectal ulcer and injected for hemostasis. Transfusion to keep Hgb >7.0  Clinical Impression   Pt admitted with above diagnosis. Patient with 10/10 migraine on initial attempt at evaluation. RN provided pain medication and on return pt able to participate although still having headache and bil LE pain. Patient self-limited evaluation to bed-level only (would not sit at EOB). He is eager to return to SNF where he know the therapists and feels he is making progress.  Pt currently with functional limitations due to the deficits listed below (see PT Problem List). Pt will benefit from skilled PT to increase their independence and safety with mobility to allow discharge to the venue listed below.       Follow Up Recommendations SNF    Equipment Recommendations  Other (comment)(TBA at SNF)    Recommendations for Other Services       Precautions / Restrictions Precautions Precautions: Fall Required Braces or Orthoses: (cannot use prosthesis due to left thigh wound)      Mobility  Bed Mobility Overal bed mobility: Needs Assistance             General bed mobility comments: scoot to [facility] with rails and trendelenburg with mod assist  Transfers                 General transfer comment: pt refused due to migraine  Ambulation/Gait                Stairs            Wheelchair Mobility    Modified Rankin (Stroke  Patients Only)       Balance                                             Pertinent Vitals/Pain Pain Assessment: 0-10 Pain Score: 8  Pain Location: headache Pain Descriptors / Indicators: Headache Pain Intervention(s): Limited activity within patient's tolerance;Monitored during session;Premedicated before session    Home Living Family/patient expects to be discharged to:: Skilled nursing facility                 Additional Comments: PTA was receiving rehab at SNF    Prior Function Level of Independence: Needs assistance   Gait / Transfers Assistance Needed: working on standing and UE strength. Doing pivot to wc no device. Can push himself in the wc "a little"   ADL's / Homemaking Assistance Needed: does sponge bath and needs help with everything        Hand Dominance   Dominant Hand: Right    Extremity/Trunk Assessment   Upper Extremity Assessment Upper Extremity Assessment: Generalized weakness(bil biceps 3+, triceps 3+)    Lower Extremity Assessment Lower Extremity Assessment: RLE deficits/detail;LLE deficits/detail RLE Deficits / Details: bandaged from mid-thigh to foot due to multiple wounds; hip flexion 2+, knee flexion to ~45 degrees LLE Deficits / Details: BKA, large left thigh wound limits  ROM/mobility due to pain; hip flexion 2+       Communication   Communication: No difficulties  Cognition Arousal/Alertness: Awake/alert Behavior During Therapy: WFL for tasks assessed/performed Overall Cognitive Status: No family/caregiver present to determine baseline cognitive functioning Area of Impairment: Attention;Following commands                   Current Attention Level: Selective   Following Commands: Follows one step commands consistently;Follows multi-step commands inconsistently              General Comments General comments (skin integrity, edema, etc.): Pt refused mobility due to ongoing migraine and bil Leg  pain (despite pre-medication)    Exercises     Assessment/Plan    PT Assessment Patient needs continued PT services  PT Problem List Decreased strength;Decreased range of motion;Decreased activity tolerance;Decreased balance;Decreased mobility;Decreased cognition;Decreased safety awareness;Obesity;Pain;Decreased knowledge of use of DME;Decreased skin integrity       PT Treatment Interventions DME instruction;Functional mobility training;Therapeutic activities;Therapeutic exercise;Balance training;Patient/family education;Cognitive remediation    PT Goals (Current goals can be found in the Care Plan section)  Acute Rehab PT Goals Patient Stated Goal: get arms stronger to be able to use wheelchair better PT Goal Formulation: With patient Time For Goal Achievement: 06/01/19 Potential to Achieve Goals: Fair    Frequency Min 2X/week   Barriers to discharge        Co-evaluation               AM-PAC PT "6 Clicks" Mobility  Outcome Measure Help needed turning from your back to your side while in a flat bed without using bedrails?: A Lot Help needed moving from lying on your back to sitting on the side of a flat bed without using bedrails?: A Little Help needed moving to and from a bed to a chair (including a wheelchair)?: Total Help needed standing up from a chair using your arms (e.g., wheelchair or bedside chair)?: Total Help needed to walk in hospital room?: Total Help needed climbing 3-5 steps with a railing? : Total 6 Click Score: 9    End of Session   Activity Tolerance: Patient limited by pain Patient left: in bed;with call bell/phone within reach;with bed alarm set Nurse Communication: Other (comment)(requesting pain medicine (again)) PT Visit Diagnosis: Muscle weakness (generalized) (M62.81);Difficulty in walking, not elsewhere classified (R26.2);Pain Pain - Right/Left: (bil legs) Pain - part of body: Leg    Time: 1520-1539 PT Time Calculation (min) (ACUTE  ONLY): 19 min   Charges:   PT Evaluation $PT Eval Low Complexity: 1 Low           Barry Brunner, PT Pager [phone]   Rexanne Mano 05/18/2019, 6:43 PM

## 2019-05-18 NOTE — Progress Notes (Addendum)
[facility] Progress Note  Subjective: doing well, Hb mid 8's today  Vitals:   05/17/19 2326 05/18/19 0125 05/18/19 0340 05/18/19 0843  BP:    130/89  Pulse:  75    Resp:  19  12  Temp: 98.3 F (36.8 C)  97.8 F (36.6 C) 98.4 F (36.9 C)  TempSrc: Oral  Oral Oral  SpO2:  97%    Weight:      Height:        Inpatient medications: . sodium chloride   Intravenous Once  . bethanechol  25 mg Oral TID  . Chlorhexidine Gluconate Cloth  6 each Topical Q0600  . Chlorhexidine Gluconate Cloth  6 each Topical Q0600  . cholestyramine  8 g Oral BID  . DULoxetine  40 mg Oral Daily  . Gerhardt's butt cream   Topical BID  . insulin aspart  0-5 Units Subcutaneous QHS  . insulin aspart  0-9 Units Subcutaneous TID WC  . Melatonin  4.5 mg Oral QHS  . midodrine  10 mg Oral Q M,W,F  . pantoprazole  40 mg Oral Daily  . tamsulosin  0.4 mg Oral QPC breakfast   . ferric gluconate (FERRLECIT/NULECIT) IV 125 mg (05/17/19 1737)  . sodium thiosulfate infusion for calciphylaxis 25 g (05/17/19 1625)   albuterol, ondansetron (ZOFRAN) IV, oxyCODONE, traMADol    Exam: General: NAD, pale, chronically ill appearing male, laying in bed Head: NCAT sclera not icteric, slightly injected. MMM Neck: Supple. No lymphadenopathy Lungs: CTA bilaterally. No wheeze, rales or rhonchi. Breathing is unlabored. Heart: RRR. No murmur, rubs or gallops.  Abdomen: soft, nontender, +BS, no guarding, no rebound tenderness Lower extremities:no edema, L BKA, multiple calciphylaxis wounds on b/l thighs, R calf in ace wrap. Neuro: AAOx3. Moves all extremities spontaneously. Psych:  Responds to questions appropriately with a normal affect. Dialysis Access: R IJ TDC, LU AVF maturing  Dialysis Orders:  MWFS - [facility]  4h  107kg  2/2 bath Hep 3000  RIJ TDC/ LUA AVF maturing (be sure to change to 2K/2Ca bath at OP unit, needs Ca lowering for calciphylaxis)   Mircera 100 mcg q2wks - last 10/16 Venofer 100mg  x10 - 2  of 10 completed Sodium thiosulfate 25mg  qHD  Assessment/Plan: 1.  Hematochezia - pt had lower GIB.  Flex sig done 10/26 showed blood in rectum/ sigmoid and solitary rectal ulcer sp inject/bicap/ clipping per GI. Felt to be stercoral ulcer from constipation. Got 2u prbc yest and Hb stable mid 8's today.  2.  ESRD -  On HD MWFS. HD Friday.  3.  Anemia of CKD - Continue iron load. Due for ESA on 10/30.  4.  Secondary Hyperparathyroidism -  Ca at goal. Will check phos.  NO calcium/Vit D because of calciphylaxis.  5.  Nutrition - Renal diet w/fluid restriction when no longer NPO 6. Calciphylaxis - recent issue, appears to be grossly improving. Cont Na thiosulfate, low Ca 2.0 bath on HD (important), and avoid all Ca/ vit D products 7. DM - per primary 8. Dispo - ready for dc from renal standpoint. Have d/w primary, will need re-authorization to get back his SNF, and repeat COVID.      Rob Justis Dupas 05/18/2019, 11:02 AM  Iron/TIBC/Ferritin/ %Sat    Component Value Date/Time   IRON 19 (L) 05/17/2019 0830   TIBC 127 (L) 05/17/2019 0830   FERRITIN 205 05/17/2019 0830   IRONPCTSAT 15 (L) 05/17/2019 0830   Recent Labs  Lab 05/18/19 0414  NA 136  K 3.6  CL 97*  CO2 24  GLUCOSE 72  70  BUN 13  CREATININE 3.32*  CALCIUM 7.5*  PHOS 3.0  ALBUMIN 2.1*   Recent Labs  Lab 05/14/19 1323  AST 26  ALT 12  ALKPHOS 72  BILITOT 1.1  PROT 7.1   Recent Labs  Lab 05/16/19 0627  05/18/19 0414  WBC 8.7  --   --   HGB 7.6*   < > 8.8*  HCT 24.6*   < > 27.0*  PLT 295  --   --    < > = values in this interval not displayed.

## 2019-05-18 NOTE — Progress Notes (Signed)
Placed patient on CPAP for the night with pressure set at [facility]

## 2019-05-18 NOTE — Progress Notes (Signed)
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   In ED, hemodynamically stable.  Hgb 9.2.  CT abdomen and pelvis revealed rectal wall thickening.  GI consulted.  Empirically started on ceftriaxone and Flagyl for possible proctitis.  Gastroenterology and nephrology consulted.  Patient underwent flex sigmoidoscopy on the evening of 10/26, was found to have a bleeding rectal ulcer, hemostasis was obtained with epinephrine injections and clips were placed.  Hemoglobin dropped to 6.5.  He received a total of 4 units throughout his hospitalization.  He also received IV iron.  Finally, hemoglobin remained stable.  GI emphasized the importance of avoiding constipation.  GI also recommended colonoscopy outpatient.    Subjective: No major events overnight of this morning.  Received 2 units of blood yesterday with appropriate response.  H&H remained stable.  No complaint this morning.  He is asking when he can go back to SNF.  Objective: Vitals:   05/18/19 0125 05/18/19 0340 05/18/19 0843 05/18/19 1200  BP:   130/89 131/82  Pulse: 75   99  Resp: 19  12 17   Temp:  97.8 F (36.6 C) 98.4 F (36.9 C) 98.4 F (36.9 C)  TempSrc:  Oral Oral Oral  SpO2: 97%   96%  Weight:      Height:        Intake/Output Summary (Last 24 hours) at 05/18/2019 1420 Last data filed at 05/18/2019 0845 Gross per 24 hour  Intake 669.4 ml  Output -  Net 669.4 ml   Filed Weights   05/16/19 1632 05/17/19 0826 05/17/19 1206  Weight: 105.9 kg 108.3 kg 107.5 kg    Examination:  GENERAL: No acute distress.  Appears well.  HEENT: MMM.  Vision and hearing grossly intact.  NECK: Supple.  No apparent JVD.  RESP:   No IWOB. Good air movement bilaterally. CVS:  RRR. Heart sounds normal.  ABD/GI/GU: Bowel sounds present. Soft. Non tender.  MSK/EXT: Left BKA. SKIN: Calciphylaxis and skin ulceration over bilateral lower extremities. NEURO: Awake, alert and oriented appropriately.  No gross deficit.  PSYCH: Calm. Normal affect.   Assessment & Plan: Acute blood loss anemia due to rectal bleed from bleeding rectal ulcer superimposed on anemia of chronic disease.  Baseline Hgb 8-9. 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  Diet controlled diabetes CBG (last 3)  Recent Labs    05/17/19 2246 05/18/19 0843 05/18/19 1201  GLUCAP 139* 86 89  -Continue current regimen  Chronic hypotension/essential hypertension -Continue midodrine with HD.  Hyperlipidemia -Continue home statin  Depression/neuropathic pain -Continue Cymbalta, tramadol and as needed oxycodone  BPH/LUTS -Continue tamsulosin and Urecholine  GERD -Continue PPI  Prolonged QTC -Minimize QT prolonging drugs.  Calciphylaxis/lower extremity wound/stage II sacral decubitus Pressure Injury 02/27/19 Penis WOC evaluation day of PIP data, this wound is trauma related to [facility] insertions and removals and documented abnormal male penile tissue per urology (Active)  02/27/19 1000  Location: Penis  Location Orientation:   Staging:   Wound Description (Comments): WOC evaluation day of PIP  data, this wound is trauma related to [facility] insertions and removals and documented abnormal male penile tissue per urology  Present on Admission: No     Pressure Injury 04/26/19 Bilateral mutilple wounds on bilateral buttock (mid and inner) (Active)  04/26/19 2319  Location:   Location Orientation: Bilateral  Staging:    Wound Description (Comments): mutilple wounds on bilateral buttock (mid and inner)  Present on Admission: Yes     Pressure Injury 04/26/19 Left Stage II -  Partial thickness loss of dermis presenting as a shallow open ulcer with a red, pink wound bed without slough. (Active)  04/26/19 2321  Location:   Location Orientation: Left  Staging: Stage II -  Partial thickness loss of dermis presenting as a shallow open ulcer with a red, pink wound bed without slough.  Wound Description (Comments):   Present on Admission: Yes             DVT prophylaxis: None due to GI bleed and BKA and lower extremity ulceration Code Status: Full code Family Communication: Patient and/or RN. Available if any question. Disposition Plan: Back to SNF after therapy, insurance authorization and COVID test.  Medically stable. Consultants: Nephrology, GI  Procedures:  10/26-Flex sigmoidoscopy with epinephrine injection and placement of clips for hemostasis  Microbiology summarized: COVID-19 negative.  Sch Meds:  Scheduled Meds: . sodium chloride   Intravenous Once  . bethanechol  25 mg Oral TID  . Chlorhexidine Gluconate Cloth  6 each Topical Q0600  . Chlorhexidine Gluconate Cloth  6 each Topical Q0600  . Chlorhexidine Gluconate Cloth  6 each Topical Q0600  . cholestyramine  8 g Oral BID  . DULoxetine  40 mg Oral Daily  . Gerhardt's butt cream   Topical BID  . insulin aspart  0-5 Units Subcutaneous QHS  . insulin aspart  0-9 Units Subcutaneous TID WC  . Melatonin  4.5 mg Oral QHS  . midodrine  10 mg Oral Q M,W,F  . pantoprazole  40 mg Oral Daily  . potassium chloride  20 mEq Oral BID  . tamsulosin  0.4 mg Oral QPC breakfast   Continuous Infusions: . ferric gluconate (FERRLECIT/NULECIT) IV 125 mg (05/17/19 1737)  . sodium thiosulfate infusion for calciphylaxis 25 g (05/17/19 1625)   PRN Meds:.albuterol, ondansetron (ZOFRAN) IV, oxyCODONE, traMADol  Antimicrobials: Anti-infectives (From  admission, onward)   Start     Dose/Rate Route Frequency Ordered Stop   05/15/19 1000  cefTRIAXone (ROCEPHIN) 2 g in sodium chloride 0.9 % 100 mL IVPB  Status:  Discontinued     2 g 200 mL/hr over 30 Minutes Intravenous Daily 05/14/19 2206 05/16/19 1100   05/15/19 0230  piperacillin-tazobactam (ZOSYN) IVPB 2.25 g  Status:  Discontinued     2.25 g 100 mL/hr over 30 Minutes Intravenous Every 8 hours 05/14/19 1815 05/14/19 2206   05/14/19 2215  metroNIDAZOLE (FLAGYL) IVPB 500 mg  Status:  Discontinued     500 mg 100 mL/hr over 60 Minutes Intravenous Every 8 hours 05/14/19 2206 05/16/19 1100   05/14/19 1830  piperacillin-tazobactam (ZOSYN) IVPB 3.375 g     3.375 g 100 mL/hr over 30 Minutes Intravenous  Once 05/14/19 1815 05/14/19 2000       I have personally reviewed the following labs and images: CBC: Recent Labs  Lab 05/14/19 1323 05/14/19 1801 05/15/19 0406  05/15/19 2345 05/16/19 0627 05/17/19 0830 05/17/19 2058 05/18/19 0414  WBC 12.4* 13.3* 9.4  --   --  8.7  --   --   --  NEUTROABS 10.0*  --   --   --   --   --   --   --   --   HGB 9.2* 8.4* 6.5*   < > 7.7* 7.6* 6.7* 8.7* 8.8*  HCT 30.3* 27.9* 21.9*   < > 24.5* 24.6* 21.4* 26.4* 27.0*  MCV 92.1 92.1 93.6  --   --  92.1  --   --   --   PLT 317 351 295  --   --  295  --   --   --    < > = values in this interval not displayed.   BMP &GFR Recent Labs  Lab 05/14/19 1323 05/15/19 0406 05/15/19 1227 05/16/19 0627 05/17/19 0234 05/18/19 0414  NA 138  --  138 134*  --  136  K 4.7  --  4.3 4.0  --  3.6  CL 103  --  104 101  --  97*  CO2 24  --   --  23  --  24  GLUCOSE 116* 73 79 79 95 72  70  BUN 35*  --  46* 47*  --  13  CREATININE 6.04*  --  7.60* 7.57*  --  3.32*  CALCIUM 8.3*  --   --  8.1*  --  7.5*  MG  --   --   --   --   --  1.8  PHOS  --   --   --   --   --  3.0   Estimated Creatinine Clearance: 33.8 mL/min (A) (by C-G formula based on SCr of 3.32 mg/dL (H)). Liver & Pancreas: Recent Labs  Lab  05/14/19 1323 05/18/19 0414  AST 26  --   ALT 12  --   ALKPHOS 72  --   BILITOT 1.1  --   PROT 7.1  --   ALBUMIN 2.0* 2.1*   Recent Labs  Lab 05/14/19 1323  LIPASE 40   No results for input(s): AMMONIA in the last 168 hours. Diabetic: Recent Labs    05/16/19 0627  HGBA1C 5.3   Recent Labs  Lab 05/17/19 1244 05/17/19 1606 05/17/19 2246 05/18/19 0843 05/18/19 1201  GLUCAP 100* 104* 139* 86 89   Cardiac Enzymes: No results for input(s): CKTOTAL, CKMB, CKMBINDEX, TROPONINI in the last 168 hours. No results for input(s): PROBNP in the last 8760 hours. Coagulation Profile: No results for input(s): INR, PROTIME in the last 168 hours. Thyroid Function Tests: No results for input(s): TSH, T4TOTAL, FREET4, T3FREE, THYROIDAB in the last 72 hours. Lipid Profile: No results for input(s): CHOL, HDL, LDLCALC, TRIG, CHOLHDL, LDLDIRECT in the last 72 hours. Anemia Panel: Recent Labs    05/17/19 0830  VITAMINB12 316  FOLATE 10.1  FERRITIN 205  TIBC 127*  IRON 19*  RETICCTPCT 1.9   Urine analysis:    Component Value Date/Time   COLORURINE YELLOW 03/10/2019 1054   APPEARANCEUR TURBID (A) 03/10/2019 1054   LABSPEC 1.020 03/10/2019 1054   PHURINE 5.0 03/10/2019 1054   GLUCOSEU 50 (A) 03/10/2019 1054   HGBUR SMALL (A) 03/10/2019 1054   BILIRUBINUR NEGATIVE 03/10/2019 [place] 03/10/2019 1054   PROTEINUR >=300 (A) 03/10/2019 1054   NITRITE NEGATIVE 03/10/2019 1054   LEUKOCYTESUR LARGE (A) 03/10/2019 1054   Sepsis Labs: Invalid input(s): PROCALCITONIN, [place]  Microbiology: Recent Results (from the past 240 hour(s))  SARS Coronavirus 2 by RT PCR (hospital order, performed in [facility] hospital lab) Nasopharyngeal Nasopharyngeal Swab  Status: None   Collection Time: 05/14/19  5:42 PM   Specimen: Nasopharyngeal Swab  Result Value Ref Range Status   SARS Coronavirus 2 NEGATIVE NEGATIVE Final    Comment: (NOTE) If result is  NEGATIVE SARS-CoV-2 target nucleic acids are NOT DETECTED. The SARS-CoV-2 RNA is generally detectable in upper and lower  respiratory specimens during the acute phase of infection. The lowest  concentration of SARS-CoV-2 viral copies this assay can detect is 250  copies / mL. A negative result does not preclude SARS-CoV-2 infection  and should not be used as the sole basis for treatment or other  patient management decisions.  A negative result may occur with  improper specimen collection / handling, submission of specimen other  than nasopharyngeal swab, presence of viral mutation(s) within the  areas targeted by this assay, and inadequate number of viral copies  (<250 copies / mL). A negative result must be combined with clinical  observations, patient history, and epidemiological information. If result is POSITIVE SARS-CoV-2 target nucleic acids are DETECTED. The SARS-CoV-2 RNA is generally detectable in upper and lower  respiratory specimens dur ing the acute phase of infection.  Positive  results are indicative of active infection with SARS-CoV-2.  Clinical  correlation with patient history and other diagnostic information is  necessary to determine patient infection status.  Positive results do  not rule out bacterial infection or co-infection with other viruses. If result is PRESUMPTIVE POSTIVE SARS-CoV-2 nucleic acids MAY BE PRESENT.   A presumptive positive result was obtained on the submitted specimen  and confirmed on repeat testing.  While 2019 novel coronavirus  (SARS-CoV-2) nucleic acids may be present in the submitted sample  additional confirmatory testing may be necessary for epidemiological  and / or clinical management purposes  to differentiate between  SARS-CoV-2 and other Sarbecovirus currently known to infect humans.  If clinically indicated additional testing with an alternate test  methodology [phone]) is advised. The SARS-CoV-2 RNA is generally  detectable  in upper and lower respiratory sp ecimens during the acute  phase of infection. The expected result is Negative. Fact Sheet for Patients:  StrictlyIdeas.no Fact Sheet for Healthcare Providers: BankingDealers.co.za This test is not yet approved or cleared by the [place] FDA and has been authorized for detection and/or diagnosis of SARS-CoV-2 by FDA under an Emergency Use Authorization (EUA).  This EUA will remain in effect (meaning this test can be used) for the duration of the COVID-19 declaration under Section 564(b)(1) of the Act, 21 U.S.C. section 360bbb-3(b)(1), unless the authorization is terminated or revoked sooner. Performed at [facility], [place] [address], [place], [place] [address]     Radiology Studies: No results found.  Porter Moes T. [place]  If 7PM-7AM, please contact night-coverage [email or web address] Password [email or web address] 05/18/2019, 2:20 PM

## 2019-05-18 NOTE — Progress Notes (Signed)
PT Cancellation Note  Patient Details Name: Michael Riggs MRN: [id number] DOB: 1970/11/22   Cancelled Treatment:    Reason Eval/Treat Not Completed: Pain limiting ability to participate  Patient reporting severe headache and needs something for pain before he can even think about therapy. Notified RN of his request. Will attempt to see after pain meds given   Barry Brunner, PT Pager [phone]   Rexanne Mano 05/18/2019, 2:07 PM

## 2019-05-19 LAB — RENAL FUNCTION PANEL
Albumin: 2.1 g/dL — ABNORMAL LOW (ref 3.5–5.0)
Anion gap: 16 — ABNORMAL HIGH (ref 5–15)
BUN: 21 mg/dL — ABNORMAL HIGH (ref 6–20)
CO2: 22 mmol/L (ref 22–32)
Calcium: 8.5 mg/dL — ABNORMAL LOW (ref 8.9–10.3)
Chloride: 96 mmol/L — ABNORMAL LOW (ref 98–111)
Creatinine, Ser: 4.42 mg/dL — ABNORMAL HIGH (ref 0.61–1.24)
GFR calc Af Amer: 17 mL/min — ABNORMAL LOW (ref >60)
GFR calc non Af Amer: 15 mL/min — ABNORMAL LOW (ref >60)
Glucose, Bld: 68 mg/dL — ABNORMAL LOW (ref 70–99)
Phosphorus: 4.8 mg/dL — ABNORMAL HIGH (ref 2.5–4.6)
Potassium: 4.1 mmol/L (ref 3.5–5.1)
Sodium: 134 mmol/L — ABNORMAL LOW (ref 135–145)

## 2019-05-19 LAB — GLUCOSE, CAPILLARY
Glucose-Capillary: 83 mg/dL (ref 70–99)
Glucose-Capillary: 206 mg/dL — ABNORMAL HIGH (ref 70–99)
Glucose-Capillary: 87 mg/dL (ref 70–99)
Glucose-Capillary: 110 mg/dL — ABNORMAL HIGH (ref 70–99)

## 2019-05-19 LAB — HEMOGLOBIN AND HEMATOCRIT, BLOOD
HCT: 26.6 % — ABNORMAL LOW (ref 39.0–52.0)
Hemoglobin: 8.5 g/dL — ABNORMAL LOW (ref 13.0–17.0)

## 2019-05-19 LAB — GLUCOSE, RANDOM: Glucose, Bld: 66 mg/dL — ABNORMAL LOW (ref 70–99)

## 2019-05-19 LAB — SARS CORONAVIRUS 2 (TAT 6-24 HRS): SARS Coronavirus 2: NEGATIVE

## 2019-05-19 MED ORDER — HEPARIN SODIUM (PORCINE) 1000 UNIT/ML IJ SOLN
INTRAMUSCULAR | Status: AC
  Administered 2019-05-19: 3000 [IU] via INTRAVENOUS_CENTRAL
  Filled 2019-05-19: qty 3

## 2019-05-19 MED ORDER — HEPARIN SODIUM (PORCINE) 1000 UNIT/ML DIALYSIS
3000.0000 [IU] | Freq: Once | INTRAMUSCULAR | Status: AC
  Administered 2019-05-19: 16:00:00 3800 [IU] via INTRAVENOUS_CENTRAL
  Administered 2019-05-19: 13:00:00 3000 [IU] via INTRAVENOUS_CENTRAL

## 2019-05-19 MED ORDER — OXYCODONE HCL 5 MG PO TABS
ORAL_TABLET | ORAL | Status: AC
  Filled 2019-05-19: qty 1

## 2019-05-19 MED ORDER — HEPARIN SODIUM (PORCINE) 1000 UNIT/ML IJ SOLN
INTRAMUSCULAR | Status: AC
  Administered 2019-05-19: 3800 [IU] via INTRAVENOUS_CENTRAL
  Filled 2019-05-19: qty 4

## 2019-05-19 NOTE — TOC Initial Note (Signed)
Transition of Care [facility]) - Initial/Assessment Note    Patient Details  Name: Michael Riggs MRN: [id number] Date of Birth: 1970-12-27  [facility] CM/SW Contact:    Vinie Sill, [place] Phone Number: 05/19/2019, 12:45 PM  Clinical Narrative:            12:49-CSW spoke with Cecille Rubin at [facility] has started insurance authorization. Covid is pending. Patient will need insurance authorization and Covid test results prior to discharge back to [facility].         11:45am-CSW visit with the patient at bedside. Patient confirmed he was from [facility]. Patient states he has been at [facility] for about 2 weeks. Patient is agreeable to returning back to [facility]. Patient states no questions or concerns.  CSW will continue to follow and assist with discharge planning.  Thurmond Butts, MSW, [facility] Clinical Social Worker [phone]     Expected Discharge Plan: Skilled Nursing Facility Barriers to Discharge: Insurance Authorization, Continued Medical Work up, SNF Pending bed offer   Patient Goals and CMS Choice Patient states their goals for this hospitalization and ongoing recovery are:: to go back to SNF      Expected Discharge Plan and Services Expected Discharge Plan: [place] In-house Referral: Clinical Social Work     Living arrangements for the past 2 months: Single Family Home                                      Prior Living Arrangements/Services Living arrangements for the past 2 months: Single Family Home   Patient language and need for interpreter reviewed:: Yes        Need for Family Participation in Patient Care: Yes (Comment) Care giver support system in place?: Yes (comment)   Criminal Activity/Legal Involvement Pertinent to Current Situation/Hospitalization: No - Comment as needed  Activities of Daily Living      Permission Sought/Granted Permission sought to share information with :  Family Supports Permission granted to share information with : Yes, Verbal Permission Granted  Share Information with NAME: [place] granted to share info w AGENCY: SNF  Permission granted to share info w Relationship: brother  Permission granted to share info w Contact Information: [phone]  Emotional Assessment Appearance:: Appears stated age Attitude/Demeanor/Rapport: Engaged Affect (typically observed): Pleasant, Appropriate Orientation: : Oriented to Self, Oriented to Place, Oriented to  Time, Oriented to Situation Alcohol / Substance Use: Not Applicable Psych Involvement: No (comment)  Admission diagnosis:  Hematochezia [K92.1] Colitis [K52.9] Lower GI bleed [K92.2] Abnormal CT of the abdomen [R93.5] Suprapubic pain, acute [R10.2] Patient Active Problem List   Diagnosis Date Noted  . Lower GI bleed 05/14/2019  . Pressure injury of skin 04/27/2019  . Palliative care encounter   . Goals of care, counseling/discussion   . Calciphylaxis   . Wound infection 03/28/2019  . Hypertension   . Dyslipidemia   . Diabetes mellitus with peripheral vascular disease ([place])   . Depression   . Physical deconditioning   . Pruritus   . Scrotal pain   . Labile blood pressure   . Scrotal edema   . Status post below-knee amputation of left lower extremity ([place])   . Slow transit constipation   . Leukocytosis   . Sleep disturbance   . Essential hypertension   . Anemia of chronic disease   . Controlled type 2 diabetes mellitus  with hyperglycemia, without long-term current use of insulin ([place])   . ESRD on dialysis ([place])   . Urinary retention   . Debility 02/09/2019  . Fluid overload 02/04/2019  . Hyperkalemia 01/30/2019  . ARF (acute renal failure) ([place]) 01/30/2019  . Hypokalemia 01/30/2019  . Hypoglycemia 01/30/2019  . Syphilis 01/30/2019  . Macrocytic anemia 01/30/2019  . End stage renal disease ([place])   . ESRD (end stage renal disease) on dialysis ([place]) 01/2019   . PDR (proliferative diabetic retinopathy) ([place]) 12/14/2013  . Venous hypertension 09/22/2013  . Diabetes mellitus type 2 in obese ([place]) 07/04/2013  . Habitual alcohol use 07/04/2013  . Obesity, Class II, BMI 35-39.9, with comorbidity 07/04/2013   PCP:  [name], MD Pharmacy:   [place], [place] [id number] [place] [id number] Phone: [phone] Fax: [phone]     Social Determinants of Health (SDOH) Interventions    Readmission Risk Interventions No flowsheet data found.

## 2019-05-19 NOTE — Progress Notes (Signed)
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   In ED, hemodynamically stable.  Hgb 9.2.  CT abdomen and pelvis revealed rectal wall thickening.  GI consulted.  Empirically started on ceftriaxone and Flagyl for possible proctitis.  Gastroenterology and nephrology consulted.  Patient underwent flex sigmoidoscopy on the evening of 10/26, was found to have a bleeding rectal ulcer, hemostasis was obtained with epinephrine injections and clips were placed.  Hemoglobin dropped to 6.5.  He received a total of 4 units throughout his hospitalization.  He also received IV iron.  Finally, hemoglobin remained stable.  GI emphasized the importance of avoiding constipation.  GI also recommended colonoscopy outpatient.   Patient is a stable for discharge.  Waiting on SNF bed and insurance authorization.   Subjective: No major events overnight of this morning.  Reports some bowel accidents when he coughs.  Had 3 BMs in the last 24 hours.  Denies blood in stool.  Denies chest pain, dyspnea or abdominal pain.  He states he has postnasal drainage but no allergies.  Objective: Vitals:   05/19/19 1300 05/19/19 1330 05/19/19 1400 05/19/19 1429  BP: 139/90 (!) 163/101 (!) 163/97 (!) 169/98  Pulse: 98 98 97 97  Resp: 20 15    Temp:      TempSrc:      SpO2:  100%    Weight:      Height:       No intake or output data in the 24 hours ending 05/19/19 1437 Filed Weights   05/17/19 0826 05/17/19 1206 05/19/19 1252  Weight: 108.3 kg 107.5 kg 109.2 kg    Examination:  GENERAL: No acute distress.  Appears well.  HEENT: MMM.  Vision and hearing grossly intact.  NECK: Supple.  No apparent JVD.  RESP:   No IWOB. Good air movement bilaterally. CVS:  RRR. Heart sounds normal.  ABD/GI/GU: Bowel sounds present. Soft. Non tender.  MSK/EXT: Left BKA SKIN: Calciphylaxis and skin ulceration over bilateral lower extremities NEURO: Awake, alert and oriented appropriately.  No apparent focal neuro deficit. PSYCH: Calm. Normal affect.  Assessment & Plan: Acute blood loss anemia due to rectal bleed from bleeding rectal ulcer superimposed on anemia of chronic disease.  Baseline Hgb 8-9. 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  Diet controlled diabetes CBG (last 3)  Recent Labs    05/18/19 2117 05/19/19 0800 05/19/19 1241  GLUCAP 104* 83 206*  -Continue current regimen  Chronic hypotension/essential hypertension -Continue midodrine with HD.  Hyperlipidemia -Continue home statin  Depression/neuropathic pain -Continue Cymbalta, tramadol and as needed oxycodone  BPH/LUTS: Still makes some urine. -Continue tamsulosin and Urecholine  OSA on CPAP -Continue nightly CPAP.  GERD -Continue PPI  Prolonged QTC -Minimize QT prolonging drugs.  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 (Active)  04/26/19 2321  Location:   Location Orientation: Left  Staging: Stage II -  Partial thickness loss of  dermis presenting as a shallow open ulcer with a red, pink wound bed without slough.  Wound Description (Comments):   Present on Admission: Yes     DVT prophylaxis: None due to GI bleed and BKA and lower extremity ulceration Code Status: Full code Family Communication: Patient and/or RN. Available if any question. Disposition Plan: Back to SNF after therapy, insurance authorization and COVID test.  Medically stable. Consultants: Nephrology, GI  Procedures:  10/26-Flex sigmoidoscopy with epinephrine injection and placement of clips for hemostasis  Microbiology summarized: COVID-19 negative.  Sch Meds:  Scheduled Meds: . sodium chloride   Intravenous Once  . bethanechol  25 mg Oral TID  . Chlorhexidine Gluconate Cloth  6 each Topical Q0600  . Chlorhexidine Gluconate Cloth  6 each Topical Q0600  . Chlorhexidine Gluconate Cloth  6 each Topical Q0600  . cholestyramine  8 g Oral BID  . DULoxetine  40 mg Oral Daily  . Gerhardt's butt cream   Topical BID  . insulin aspart  0-5 Units Subcutaneous QHS  . insulin aspart  0-9 Units Subcutaneous TID WC  . Melatonin  4.5 mg Oral QHS  . midodrine  10 mg Oral Q M,W,F  . oxyCODONE      . pantoprazole  40 mg Oral Daily  . potassium chloride  20 mEq Oral BID  . tamsulosin  0.4 mg Oral QPC breakfast   Continuous Infusions: . ferric gluconate (FERRLECIT/NULECIT) IV 125 mg (05/19/19 1425)  . sodium thiosulfate infusion for calciphylaxis 25 g (05/17/19 1625)   PRN Meds:.albuterol, ondansetron (ZOFRAN) IV, oxyCODONE, traMADol  Antimicrobials: Anti-infectives (From admission, onward)   Start     Dose/Rate Route Frequency Ordered Stop   05/15/19 1000  cefTRIAXone (ROCEPHIN) 2 g in sodium chloride 0.9 % 100 mL IVPB  Status:  Discontinued     2 g 200 mL/hr over 30 Minutes Intravenous Daily 05/14/19 2206 05/16/19 1100   05/15/19 0230  piperacillin-tazobactam (ZOSYN) IVPB 2.25 g  Status:  Discontinued     2.25 g 100 mL/hr over 30 Minutes  Intravenous Every 8 hours 05/14/19 1815 05/14/19 2206   05/14/19 2215  metroNIDAZOLE (FLAGYL) IVPB 500 mg  Status:  Discontinued     500 mg 100 mL/hr over 60 Minutes Intravenous Every 8 hours 05/14/19 2206 05/16/19 1100   05/14/19 1830  piperacillin-tazobactam (ZOSYN) IVPB 3.375 g     3.375 g 100 mL/hr over 30 Minutes Intravenous  Once 05/14/19 1815 05/14/19 2000       I have personally reviewed the following labs and images: CBC: Recent Labs  Lab 05/14/19 1323 05/14/19 1801 05/15/19 0406  05/16/19 0627 05/17/19 0830 05/17/19 2058 05/18/19 0414 05/19/19 0623  WBC 12.4* 13.3* 9.4  --  8.7  --   --   --   --   NEUTROABS 10.0*  --   --   --   --   --   --   --   --   HGB 9.2* 8.4* 6.5*   < > 7.6* 6.7* 8.7* 8.8* 8.5*  HCT 30.3* 27.9* 21.9*   < > 24.6* 21.4* 26.4* 27.0* 26.6*  MCV 92.1 92.1 93.6  --  92.1  --   --   --   --  PLT 317 351 295  --  295  --   --   --   --    < > = values in this interval not displayed.   BMP &GFR Recent Labs  Lab 05/14/19 1323  05/15/19 1227 05/16/19 0627 05/17/19 0234 05/18/19 0414 05/19/19 0623  NA 138  --  138 134*  --  136 134*  K 4.7  --  4.3 4.0  --  3.6 4.1  CL 103  --  104 101  --  97* 96*  CO2 24  --   --  23  --  24 22  GLUCOSE 116*   < > 79 79 95 72  70 68*  66*  BUN 35*  --  46* 47*  --  13 21*  CREATININE 6.04*  --  7.60* 7.57*  --  3.32* 4.42*  CALCIUM 8.3*  --   --  8.1*  --  7.5* 8.5*  MG  --   --   --   --   --  1.8  --   PHOS  --   --   --   --   --  3.0 4.8*   < > = values in this interval not displayed.   Estimated Creatinine Clearance: 25.6 mL/min (A) (by C-G formula based on SCr of 4.42 mg/dL (H)). Liver & Pancreas: Recent Labs  Lab 05/14/19 1323 05/18/19 0414 05/19/19 0623  AST 26  --   --   ALT 12  --   --   ALKPHOS 72  --   --   BILITOT 1.1  --   --   PROT 7.1  --   --   ALBUMIN 2.0* 2.1* 2.1*   Recent Labs  Lab 05/14/19 1323  LIPASE 40   No results for input(s): AMMONIA in the last 168  hours. Diabetic: No results for input(s): HGBA1C in the last 72 hours. Recent Labs  Lab 05/18/19 1201 05/18/19 1525 05/18/19 2117 05/19/19 0800 05/19/19 1241  GLUCAP 89 101* 104* 83 206*   Cardiac Enzymes: No results for input(s): CKTOTAL, CKMB, CKMBINDEX, TROPONINI in the last 168 hours. No results for input(s): PROBNP in the last 8760 hours. Coagulation Profile: No results for input(s): INR, PROTIME in the last 168 hours. Thyroid Function Tests: No results for input(s): TSH, T4TOTAL, FREET4, T3FREE, THYROIDAB in the last 72 hours. Lipid Profile: No results for input(s): CHOL, HDL, LDLCALC, TRIG, CHOLHDL, LDLDIRECT in the last 72 hours. Anemia Panel: Recent Labs    05/17/19 0830  VITAMINB12 316  FOLATE 10.1  FERRITIN 205  TIBC 127*  IRON 19*  RETICCTPCT 1.9   Urine analysis:    Component Value Date/Time   COLORURINE YELLOW 03/10/2019 1054   APPEARANCEUR TURBID (A) 03/10/2019 1054   LABSPEC 1.020 03/10/2019 1054   PHURINE 5.0 03/10/2019 1054   GLUCOSEU 50 (A) 03/10/2019 1054   HGBUR SMALL (A) 03/10/2019 1054   BILIRUBINUR NEGATIVE 03/10/2019 [place] 03/10/2019 1054   PROTEINUR >=300 (A) 03/10/2019 1054   NITRITE NEGATIVE 03/10/2019 1054   LEUKOCYTESUR LARGE (A) 03/10/2019 1054   Sepsis Labs: Invalid input(s): PROCALCITONIN, [place]  Microbiology: Recent Results (from the past 240 hour(s))  SARS Coronavirus 2 by RT PCR (hospital order, performed in [facility] hospital lab) Nasopharyngeal Nasopharyngeal Swab     Status: None   Collection Time: 05/14/19  5:42 PM   Specimen: Nasopharyngeal Swab  Result Value Ref Range Status   SARS Coronavirus 2 NEGATIVE NEGATIVE  Final    Comment: (NOTE) If result is NEGATIVE SARS-CoV-2 target nucleic acids are NOT DETECTED. The SARS-CoV-2 RNA is generally detectable in upper and lower  respiratory specimens during the acute phase of infection. The lowest  concentration of SARS-CoV-2 viral copies this  assay can detect is 250  copies / mL. A negative result does not preclude SARS-CoV-2 infection  and should not be used as the sole basis for treatment or other  patient management decisions.  A negative result may occur with  improper specimen collection / handling, submission of specimen other  than nasopharyngeal swab, presence of viral mutation(s) within the  areas targeted by this assay, and inadequate number of viral copies  (<250 copies / mL). A negative result must be combined with clinical  observations, patient history, and epidemiological information. If result is POSITIVE SARS-CoV-2 target nucleic acids are DETECTED. The SARS-CoV-2 RNA is generally detectable in upper and lower  respiratory specimens dur ing the acute phase of infection.  Positive  results are indicative of active infection with SARS-CoV-2.  Clinical  correlation with patient history and other diagnostic information is  necessary to determine patient infection status.  Positive results do  not rule out bacterial infection or co-infection with other viruses. If result is PRESUMPTIVE POSTIVE SARS-CoV-2 nucleic acids MAY BE PRESENT.   A presumptive positive result was obtained on the submitted specimen  and confirmed on repeat testing.  While 2019 novel coronavirus  (SARS-CoV-2) nucleic acids may be present in the submitted sample  additional confirmatory testing may be necessary for epidemiological  and / or clinical management purposes  to differentiate between  SARS-CoV-2 and other Sarbecovirus currently known to infect humans.  If clinically indicated additional testing with an alternate test  methodology [phone]) is advised. The SARS-CoV-2 RNA is generally  detectable in upper and lower respiratory sp ecimens during the acute  phase of infection. The expected result is Negative. Fact Sheet for Patients:  StrictlyIdeas.no Fact Sheet for Healthcare  Providers: BankingDealers.co.za This test is not yet approved or cleared by the [place] FDA and has been authorized for detection and/or diagnosis of SARS-CoV-2 by FDA under an Emergency Use Authorization (EUA).  This EUA will remain in effect (meaning this test can be used) for the duration of the COVID-19 declaration under Section 564(b)(1) of the Act, 21 U.S.C. section 360bbb-3(b)(1), unless the authorization is terminated or revoked sooner. Performed at [facility], [place] [address] [place], [place] [address]     Radiology Studies: No results found.  Frady Taddeo T. [place]  If 7PM-7AM, please contact night-coverage [email or web address] Password [email or web address] 05/19/2019, 2:37 PM

## 2019-05-19 NOTE — Progress Notes (Signed)
Michael Riggs, Michael Riggs, Michael active bleeding  Vitals:   05/18/19 2130 05/18/19 2340 05/19/19 0423 05/19/19 0800  BP:  (!) 149/89 (!) 148/85 (!) 155/98  Pulse: 97 (!) 102 98 (!) 103  Resp: 19 18 17 16   Temp:  98 F (36.7 C) 97.7 F (36.5 C) 98.7 F (37.1 C)  TempSrc:  Oral Oral Oral  SpO2: 93%   94%  Weight:      Height:        Inpatient medications: . sodium chloride   Intravenous Once  . bethanechol  25 mg Oral TID  . Chlorhexidine Gluconate Cloth  6 each Topical Q0600  . Chlorhexidine Gluconate Cloth  6 each Topical Q0600  . Chlorhexidine Gluconate Cloth  6 each Topical Q0600  . cholestyramine  8 g Oral BID  . DULoxetine  40 mg Oral Daily  . Gerhardt's butt cream   Topical BID  . insulin aspart  0-5 Units Subcutaneous QHS  . insulin aspart  0-9 Units Subcutaneous TID WC  . Melatonin  4.5 mg Oral QHS  . midodrine  10 mg Oral Q M,W,F  . pantoprazole  40 mg Oral Daily  . potassium chloride  20 mEq Oral BID  . tamsulosin  0.4 mg Oral QPC breakfast   . ferric gluconate (FERRLECIT/NULECIT) IV 125 mg (05/17/19 1737)  . sodium thiosulfate infusion for calciphylaxis 25 g (05/17/19 1625)   albuterol, ondansetron (ZOFRAN) IV, oxyCODONE, traMADol    Exam: General: NAD, pale, chronically ill appearing male, laying in bed Head: NCAT sclera not icteric, slightly injected. MMM Neck: Supple. Michael lymphadenopathy Lungs: CTA bilaterally. Michael wheeze, rales or rhonchi. Breathing is unlabored. Heart: RRR. Michael murmur, rubs or gallops.  Abdomen: soft, nontender, +BS, Michael guarding, Michael rebound tenderness Lower extremities:Michael edema, L BKA, multiple calciphylaxis wounds on b/l thighs, R calf in ace wrap. Neuro: AAOx3. Moves all extremities spontaneously. Psych:  Responds to questions appropriately with a normal affect. Dialysis Access: R IJ TDC, LU AVF maturing  Dialysis Orders:  MWFS - [facility]  4h  107kg  2/2 bath Hep 3000  RIJ  TDC/ LUA AVF maturing (be sure to change to 2K/2Ca bath at OP unit, needs Ca lowering for calciphylaxis)   Mircera 100 mcg q2wks - last 10/16 Venofer 100mg  x10 - 2 of 10 completed Sodium thiosulfate 25mg  qHD  Assessment/Plan: 1.  Hematochezia - pt had lower GIB.  Flex sig done 10/26 showed blood in rectum/ sigmoid and solitary rectal ulcer sp inject/bicap/ clipping per GI. Felt to be stercoral ulcer from constipation. Got 4u prbc total while here. Michael Riggs.  2.  ESRD -  On HD MWFS. HD Riggs.  3.  Anemia of CKD - Continue iron load. Due for ESA on 10/30.  4.  Secondary Hyperparathyroidism -  Ca at goal. Will check phos.  Michael calcium/Vit D because of calciphylaxis.  5.  Nutrition - Renal diet w/fluid restriction when Michael longer NPO 6. Calciphylaxis - recent issue, appears to be grossly improving. Cont Na thiosulfate, low Ca 2.0 bath on HD (important), and avoid all Ca/ vit D products 7. DM - per primary 8. Dispo - ready for dc back to his SNF, have d/w primary MD and renal SW, would like to get him back before Monday     Rob [place] 05/19/2019, 10:23 AM  Iron/TIBC/Ferritin/ %Sat    Component Value Date/Time   IRON 19 (L) 05/17/2019 0830  TIBC 127 (L) 05/17/2019 0830   FERRITIN 205 05/17/2019 0830   IRONPCTSAT 15 (L) 05/17/2019 0830   Recent Labs  Lab 05/19/19 0623  NA 134*  K 4.1  CL 96*  CO2 22  GLUCOSE 68*  66*  BUN 21*  CREATININE 4.42*  CALCIUM 8.5*  PHOS 4.8*  ALBUMIN 2.1*   Recent Labs  Lab 05/14/19 1323  AST 26  ALT 12  ALKPHOS 72  BILITOT 1.1  PROT 7.1   Recent Labs  Lab 05/16/19 0627  05/19/19 0623  WBC 8.7  --   --   HGB 7.6*   < > 8.5*  HCT 24.6*   < > 26.6*  PLT 295  --   --    < > = values in this interval not displayed.

## 2019-05-19 NOTE — Progress Notes (Signed)
RT placed pt on CPAP dream station for the night on home setting of 7 cm H2O with 3 Lpm O2 bled into the system. Pt respiratory status is stable at this time. RT will continue to monitor.

## 2019-05-20 DIAGNOSIS — Z992 Dependence on renal dialysis: Secondary | ICD-10-CM | POA: Diagnosis not present

## 2019-05-20 DIAGNOSIS — Z89512 Acquired absence of left leg below knee: Secondary | ICD-10-CM

## 2019-05-20 DIAGNOSIS — E1122 Type 2 diabetes mellitus with diabetic chronic kidney disease: Secondary | ICD-10-CM | POA: Diagnosis not present

## 2019-05-20 DIAGNOSIS — N186 End stage renal disease: Secondary | ICD-10-CM | POA: Diagnosis not present

## 2019-05-20 LAB — RENAL FUNCTION PANEL
Albumin: 2 g/dL — ABNORMAL LOW (ref 3.5–5.0)
Anion gap: 19 — ABNORMAL HIGH (ref 5–15)
BUN: 15 mg/dL (ref 6–20)
CO2: 21 mmol/L — ABNORMAL LOW (ref 22–32)
Calcium: 8.1 mg/dL — ABNORMAL LOW (ref 8.9–10.3)
Chloride: 97 mmol/L — ABNORMAL LOW (ref 98–111)
Creatinine, Ser: 3.35 mg/dL — ABNORMAL HIGH (ref 0.61–1.24)
GFR calc Af Amer: 24 mL/min — ABNORMAL LOW (ref >60)
GFR calc non Af Amer: 21 mL/min — ABNORMAL LOW (ref >60)
Glucose, Bld: 75 mg/dL (ref 70–99)
Phosphorus: 4 mg/dL (ref 2.5–4.6)
Potassium: 3.5 mmol/L (ref 3.5–5.1)
Sodium: 137 mmol/L (ref 135–145)

## 2019-05-20 LAB — GLUCOSE, RANDOM: Glucose, Bld: 75 mg/dL (ref 70–99)

## 2019-05-20 LAB — GLUCOSE, CAPILLARY
Glucose-Capillary: 74 mg/dL (ref 70–99)
Glucose-Capillary: 73 mg/dL (ref 70–99)
Glucose-Capillary: 92 mg/dL (ref 70–99)
Glucose-Capillary: 86 mg/dL (ref 70–99)

## 2019-05-20 LAB — HEMOGLOBIN AND HEMATOCRIT, BLOOD
HCT: 27.2 % — ABNORMAL LOW (ref 39.0–52.0)
Hemoglobin: 8.8 g/dL — ABNORMAL LOW (ref 13.0–17.0)

## 2019-05-20 NOTE — Discharge Summary (Signed)
Physician Discharge Summary  Michael Riggs EKC:[id number] DOB: [birth year] DOA: 05/14/2019  PCP: [name], MD  Admit date: 05/14/2019 Discharge date: 05/20/2019  Admitted From: SNF Disposition: SNF  Recommendations for Outpatient Follow-up:  1. Follow up with PCP in 1-2 weeks 2. Please obtain CBC/BMP/Mag at follow up 3. 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   In ED, hemodynamically stable.  Hgb 9.2.  CT abdomen and pelvis revealed rectal wall thickening.  GI consulted.  Empirically started on ceftriaxone and Flagyl for possible proctitis.  Gastroenterology and nephrology consulted.  Patient underwent flex sigmoidoscopy on the evening of 10/26, was found to have a bleeding rectal ulcer, hemostasis was obtained with epinephrine injections and clips were placed.  Hemoglobin dropped to 6.5.  He received a total of 4 units throughout his hospitalization.  He also received IV iron.  Finally, hemoglobin remained stable.  GI emphasized the importance of avoiding constipation.  GI also recommended colonoscopy outpatient.   Evaluated by PT/OT who recommended SNF for ongoing therapy.  See individual problems below for more hospital course.  Subjective: No major events overnight of this morning.  No complaints.  Feels well and ready to go back to SNF.  Discharge Diagnoses:  Acute blood loss anemia due to rectal bleed from bleeding rectal ulcer superimposed on anemia of chronic disease.  Baseline Hgb 8-9. -Flex sigmoidoscopy, epinephrine injection and hemostatic clips (5) on 05/15/2019 -Hgb 9.2 (admit)>> 6.5>2u> 7.8> 6.7>2u>8.7>> 8.5 -GI  recommended colonoscopy in the future -Recheck CBC in 1 week.  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  Diet controlled diabetes CBG (last 3)  Recent Labs    05/19/19 1712 05/19/19 2146 05/20/19 0834  GLUCAP 87 110* 74    Chronic hypotension/essential hypertension -Continue midodrine with HD.  Hyperlipidemia -Continue home statin  Depression/neuropathic pain -Continue Cymbalta and tramadol.  BPH/LUTS: Still makes some urine. -Continue tamsulosin and Urecholine  OSA on CPAP -Continue nightly CPAP.  GERD -Continue PPI  Prolonged QTC -Minimize QT prolonging drugs.  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 (Active)  04/26/19 2321  Location:   Location Orientation: Left  Staging: Stage II -  Partial thickness loss of dermis presenting as a shallow open ulcer with a red, pink wound bed without slough.  Wound Description (Comments):   Present on Admission: Yes   -Dressing procedure/placement/frequency:Gerhardts butt paste twice daily.  No disposable briefs or underpads.   Discharge Instructions  Discharge Instructions    Diet - low sodium heart healthy   Complete by: As directed    Diet Carb Modified   Complete by: As directed    Increase activity slowly   Complete by: As directed      Allergies as of 05/20/2019   No Known Allergies  Medication List    STOP taking these medications   amLODipine 5 MG tablet Commonly known as: NORVASC   Loperamide HCl 1 MG/7.5ML Liqd   oxyCODONE 5 MG  immediate release tablet Commonly known as: Oxy IR/ROXICODONE     TAKE these medications   acetaminophen 325 MG tablet Commonly known as: TYLENOL Take 2 tablets (650 mg total) by mouth every 6 (six) hours as needed for mild pain (or Fever >/= 101).   albuterol 108 (90 Base) MCG/ACT inhaler Commonly known as: VENTOLIN HFA Inhale 2 puffs into the lungs every 6 (six) hours as needed for wheezing or shortness of breath.   atorvastatin 40 MG tablet Commonly known as: LIPITOR Take 1 tablet (40 mg total) by mouth daily.   b complex-vitamin c-folic acid 0.8 MG Tabs tablet Take 1 tablet by mouth daily.   bethanechol 25 MG tablet Commonly known as: URECHOLINE Take 1 tablet (25 mg total) by mouth 3 (three) times daily.   camphor-menthol lotion Commonly known as: SARNA Apply 1 application topically as needed for itching.   cholestyramine 4 g packet Commonly known as: QUESTRAN Take 2 packets (8 g total) by mouth 2 (two) times daily.   collagenase ointment Commonly known as: SANTYL Apply 1 application topically daily. Apply to the affected area daily plus dry dressing   DULoxetine HCl 40 MG Cpep Take 40 mg by mouth daily.   Gerhardt's butt cream Crea Apply 1 application topically 3 (three) times daily as needed for irritation.   hydrocortisone 25 MG suppository Commonly known as: ANUSOL-HC Place 1 suppository (25 mg total) rectally 2 (two) times daily as needed for hemorrhoids or anal itching.   ipratropium-albuterol 0.5-2.5 (3) MG/3ML Soln Commonly known as: DUONEB Take 3 mLs by nebulization every 6 (six) hours as needed (For shortness of breath).   Lidocaine-Prilocaine &Lido HCl 2.5-2.5 & 3.88 % Kit Apply 1 application topically every Monday, Wednesday, and Friday. Apply to fistula and wrap with saran wrap site prior to dialysys   Melatonin 3 MG Tabs Take 1.5 tablets (4.5 mg total) by mouth at bedtime.   midodrine 10 MG tablet Commonly known as: PROAMATINE Take 1 tablet  (10 mg total) by mouth every Monday, Wednesday, and Friday.   nystatin powder Generic drug: nystatin Apply 1 Bottle topically 3 (three) times daily.   pantoprazole 40 MG tablet Commonly known as: PROTONIX Take 1 tablet (40 mg total) by mouth daily.   polyethylene glycol 17 g packet Commonly known as: MIRALAX / GLYCOLAX Take 17 g by mouth daily. What changed:   when to take this  reasons to take this  additional instructions   senna-docusate 8.6-50 MG tablet Commonly known as: Senokot S Take 1 tablet by mouth at bedtime as needed for mild constipation.   silver sulfADIAZINE 1 % cream Commonly known as: SILVADENE Apply 1 application topically daily.   tamsulosin 0.4 MG Caps capsule Commonly known as: FLOMAX Take 1 capsule (0.4 mg total) by mouth daily after breakfast.   traMADol 50 MG tablet Commonly known as: ULTRAM Take 1 tablet (50 mg total) by mouth every 8 (eight) hours as needed for moderate pain. What changed: when to take this       Consultations:  Nephrology  Gastroenterology  Wound care  Procedures/Studies:  2D Echo: None  Flex sigmoidoscopy, epinephrine injection and hemostatic clips (5) on 05/15/2019  Ct Abdomen Pelvis Wo Contrast  Result Date: 05/14/2019 CLINICAL DATA:  Rectal bleeding this morning. Constipation. Abdominal distension. EXAM: CT ABDOMEN AND  PELVIS WITHOUT CONTRAST TECHNIQUE: Multidetector CT imaging of the abdomen and pelvis was performed following the standard protocol without IV contrast. COMPARISON:  04/27/2019 abdominal radiograph. 02/11/2019 CT abdomen/pelvis. FINDINGS: Lower chest: Small to moderate bilateral dependent pleural effusions with associated dependent bibasilar atelectasis. Coronary atherosclerosis. Trace pericardial effusion/thickening. Hepatobiliary: Normal liver size. No liver mass. Cholelithiasis. No significant gallbladder distention. No gallbladder wall thickening. No pericholecystic fluid. No biliary ductal  dilatation. Pancreas: Normal, with no mass or duct dilation. Spleen: Normal size. No mass. Adrenals/Urinary Tract: Normal adrenals. No hydronephrosis. No renal stones. No contour deforming renal masses. Nondistended bladder with questionable diffuse bladder wall thickening. Stomach/Bowel: Normal non-distended stomach. Normal caliber small bowel with no small bowel wall thickening. Normal appendix. Rectum moderately distended by stool. Mild circumferential rectal wall thickening is new with mild haziness of perirectal fat. Otherwise unremarkable large bowel with no diverticulosis. Vascular/Lymphatic: Atherosclerotic nonaneurysmal abdominal aorta. Mild bilateral external iliac lymphadenopathy measuring up to 1.0 cm on the left (series 8/image 86) and 1.2 cm on the right (series 8/image 85), stable since 02/11/2019 CT. No new pathologically enlarged abdominopelvic nodes. Reproductive: Atrophic appearing prostate. Other: No pneumoperitoneum, ascites or focal fluid collection. Musculoskeletal: No aggressive appearing focal osseous lesions. Moderate thoracolumbar spondylosis. IMPRESSION: 1. Rectum moderately distended by stool. New mild circumferential rectal wall thickening with mild haziness of perirectal fat, cannot exclude stercoral colitis or other nonspecific proctitis. 2. Small to moderate bilateral dependent pleural effusions with associated dependent bibasilar atelectasis. Trace pericardial effusion/thickening. 3. Cholelithiasis. 4. Stable mild bilateral external iliac lymphadenopathy, nonspecific, presumably reactive. 5. Coronary atherosclerosis. 6. Aortic Atherosclerosis (ICD10-I70.0). Electronically Signed   By: Ilona Sorrel M.D.   On: 05/14/2019 17:11   Dg Abd 1 View  Result Date: 04/27/2019 CLINICAL DATA:  Abdominal pain EXAM: ABDOMEN - 1 VIEW COMPARISON:  None. FINDINGS: The bowel gas pattern is normal. No radio-opaque calculi or other significant radiographic abnormality are seen. Sclerotic bone lesion  in the right proximal femur likely reflecting a bone island. IMPRESSION: No acute abdominal abnormality. Electronically Signed   By: Kathreen Devoid   On: 04/27/2019 14:00   Dg Abd 2 Views  Result Date: 05/15/2019 CLINICAL DATA:  Lower GI bleed. EXAM: ABDOMEN - 2 VIEW COMPARISON:  CT scan 05/14/2019 FINDINGS: Supine and decubitus views of the abdomen show no gaseous bowel dilatation to suggest obstruction. No non dependent free gas visible on the decubitus film to suggest perforation. Visualized portions of the lower lungs appear clear with tiny bilateral pleural effusions. IMPRESSION: Negative. Electronically Signed   By: Misty Stanley M.D.   On: 05/15/2019 10:39       Discharge Exam: Vitals:   05/20/19 0004 05/20/19 0400  BP: (!) 153/91 (!) 149/87  Pulse:  97  Resp:  (!) 22  Temp: 98.3 F (36.8 C) 97.8 F (36.6 C)  SpO2:  99%   GENERAL: No acute distress.  Appears well.  HEENT: MMM.  Vision and hearing grossly intact.  NECK: Supple.  No apparent JVD.  RESP:  No IWOB. Good air movement bilaterally. CVS:  RRR. Heart sounds normal.  ABD/GI/GU: Bowel sounds present. Soft. Non tender.  MSK/EXT: Left BKA SKIN: Calciphylaxis and skin ulceration over bilateral lower extremities NEURO: Awake, alert and oriented appropriately.  No apparent focal neuro deficit. PSYCH: Calm. Normal affect.   The results of significant diagnostics from this hospitalization (including imaging, microbiology, ancillary and laboratory) are listed below for reference.     Microbiology: Recent Results (from the past 240 hour(s))  SARS Coronavirus  2 by RT PCR (hospital order, performed in [facility] hospital lab) Nasopharyngeal Nasopharyngeal Swab     Status: None   Collection Time: 05/14/19  5:42 PM   Specimen: Nasopharyngeal Swab  Result Value Ref Range Status   SARS Coronavirus 2 NEGATIVE NEGATIVE Final    Comment: (NOTE) If result is NEGATIVE SARS-CoV-2 target nucleic acids are NOT DETECTED. The  SARS-CoV-2 RNA is generally detectable in upper and lower  respiratory specimens during the acute phase of infection. The lowest  concentration of SARS-CoV-2 viral copies this assay can detect is 250  copies / mL. A negative result does not preclude SARS-CoV-2 infection  and should not be used as the sole basis for treatment or other  patient management decisions.  A negative result may occur with  improper specimen collection / handling, submission of specimen other  than nasopharyngeal swab, presence of viral mutation(s) within the  areas targeted by this assay, and inadequate number of viral copies  (<250 copies / mL). A negative result must be combined with clinical  observations, patient history, and epidemiological information. If result is POSITIVE SARS-CoV-2 target nucleic acids are DETECTED. The SARS-CoV-2 RNA is generally detectable in upper and lower  respiratory specimens dur ing the acute phase of infection.  Positive  results are indicative of active infection with SARS-CoV-2.  Clinical  correlation with patient history and other diagnostic information is  necessary to determine patient infection status.  Positive results do  not rule out bacterial infection or co-infection with other viruses. If result is PRESUMPTIVE POSTIVE SARS-CoV-2 nucleic acids MAY BE PRESENT.   A presumptive positive result was obtained on the submitted specimen  and confirmed on repeat testing.  While 2019 novel coronavirus  (SARS-CoV-2) nucleic acids may be present in the submitted sample  additional confirmatory testing may be necessary for epidemiological  and / or clinical management purposes  to differentiate between  SARS-CoV-2 and other Sarbecovirus currently known to infect humans.  If clinically indicated additional testing with an alternate test  methodology [phone]) is advised. The SARS-CoV-2 RNA is generally  detectable in upper and lower respiratory sp ecimens during the acute   phase of infection. The expected result is Negative. Fact Sheet for Patients:  StrictlyIdeas.no Fact Sheet for Healthcare Providers: BankingDealers.co.za This test is not yet approved or cleared by the [place] FDA and has been authorized for detection and/or diagnosis of SARS-CoV-2 by FDA under an Emergency Use Authorization (EUA).  This EUA will remain in effect (meaning this test can be used) for the duration of the COVID-19 declaration under Section 564(b)(1) of the Act, 21 U.S.C. section 360bbb-3(b)(1), unless the authorization is terminated or revoked sooner. Performed at [facility], [place] [address], [place], [place] [address]   SARS CORONAVIRUS 2 (TAT 6-24 HRS) Nasopharyngeal Nasopharyngeal Swab     Status: None   Collection Time: 05/18/19 10:48 AM   Specimen: Nasopharyngeal Swab  Result Value Ref Range Status   SARS Coronavirus 2 NEGATIVE NEGATIVE Final    Comment: (NOTE) SARS-CoV-2 target nucleic acids are NOT DETECTED. The SARS-CoV-2 RNA is generally detectable in upper and lower respiratory specimens during the acute phase of infection. Negative results do not preclude SARS-CoV-2 infection, do not rule out co-infections with other pathogens, and should not be used as the sole basis for treatment or other patient management decisions. Negative results must be combined with clinical observations, patient history, and epidemiological information. The expected result is Negative. Fact Sheet for Patients: SugarRoll.be  Fact Sheet for Healthcare Providers: https://www.woods-mathews.com/ This test is not yet approved or cleared by the [place] FDA and  has been authorized for detection and/or diagnosis of SARS-CoV-2 by FDA under an Emergency Use Authorization (EUA). This EUA will remain  in effect (meaning this test can be used) for the duration of the COVID-19 declaration  under Section 56 4(b)(1) of the Act, 21 U.S.C. section 360bbb-3(b)(1), unless the authorization is terminated or revoked sooner. Performed at [facility], [place] [address], [place], [place] [address]      Labs: BNP (last 3 results) No results for input(s): BNP in the last 8760 hours. Basic Metabolic Panel: Recent Labs  Lab 05/14/19 1323  05/15/19 1227 05/16/19 1540 05/17/19 0234 05/18/19 0414 05/19/19 0623 05/20/19 0540  NA 138  --  138 134*  --  136 134* 137  K 4.7  --  4.3 4.0  --  3.6 4.1 3.5  CL 103  --  104 101  --  97* 96* 97*  CO2 24  --   --  23  --  24 22 21*  GLUCOSE 116*   < > 79 79 95 72  70 68*  66* 75  75  BUN 35*  --  46* 47*  --  13 21* 15  CREATININE 6.04*  --  7.60* 7.57*  --  3.32* 4.42* 3.35*  CALCIUM 8.3*  --   --  8.1*  --  7.5* 8.5* 8.1*  MG  --   --   --   --   --  1.8  --   --   PHOS  --   --   --   --   --  3.0 4.8* 4.0   < > = values in this interval not displayed.   Liver Function Tests: Recent Labs  Lab 05/14/19 1323 05/18/19 0414 05/19/19 0623 05/20/19 0540  AST 26  --   --   --   ALT 12  --   --   --   ALKPHOS 72  --   --   --   BILITOT 1.1  --   --   --   PROT 7.1  --   --   --   ALBUMIN 2.0* 2.1* 2.1* 2.0*   Recent Labs  Lab 05/14/19 1323  LIPASE 40   No results for input(s): AMMONIA in the last 168 hours. CBC: Recent Labs  Lab 05/14/19 1323 05/14/19 1801 05/15/19 0406  05/16/19 0627 05/17/19 0830 05/17/19 2058 05/18/19 0414 05/19/19 0623 05/20/19 0540  WBC 12.4* 13.3* 9.4  --  8.7  --   --   --   --   --   NEUTROABS 10.0*  --   --   --   --   --   --   --   --   --   HGB 9.2* 8.4* 6.5*   < > 7.6* 6.7* 8.7* 8.8* 8.5* 8.8*  HCT 30.3* 27.9* 21.9*   < > 24.6* 21.4* 26.4* 27.0* 26.6* 27.2*  MCV 92.1 92.1 93.6  --  92.1  --   --   --   --   --   PLT 317 351 295  --  295  --   --   --   --   --    < > = values in this interval not displayed.   Cardiac Enzymes: No results for input(s): CKTOTAL, CKMB,  CKMBINDEX, TROPONINI in the last 168 hours.  BNP: Invalid input(s): POCBNP CBG: Recent Labs  Lab 05/18/19 2117 05/19/19 0800 05/19/19 1241 05/19/19 1712 05/19/19 2146  GLUCAP 104* 83 206* 87 110*   D-Dimer No results for input(s): DDIMER in the last 72 hours. Hgb A1c No results for input(s): HGBA1C in the last 72 hours. Lipid Profile No results for input(s): CHOL, HDL, LDLCALC, TRIG, CHOLHDL, LDLDIRECT in the last 72 hours. Thyroid function studies No results for input(s): TSH, T4TOTAL, T3FREE, THYROIDAB in the last 72 hours.  Invalid input(s): FREET3 Anemia work up Recent Labs    05/17/19 0830  VITAMINB12 316  FOLATE 10.1  FERRITIN 205  TIBC 127*  IRON 19*  RETICCTPCT 1.9   Urinalysis    Component Value Date/Time   COLORURINE YELLOW 03/10/2019 1054   APPEARANCEUR TURBID (A) 03/10/2019 1054   LABSPEC 1.020 03/10/2019 1054   PHURINE 5.0 03/10/2019 1054   GLUCOSEU 50 (A) 03/10/2019 1054   HGBUR SMALL (A) 03/10/2019 1054   BILIRUBINUR NEGATIVE 03/10/2019 1054   [place] 03/10/2019 1054   PROTEINUR >=300 (A) 03/10/2019 1054   NITRITE NEGATIVE 03/10/2019 1054   LEUKOCYTESUR LARGE (A) 03/10/2019 1054   Sepsis Labs Invalid input(s): PROCALCITONIN,  WBC,  LACTICIDVEN   Time coordinating discharge: 35 minutes  SIGNED:  Mercy Riding, MD  [facility] 05/20/2019, 8:04 AM  If 7PM-7AM, please contact night-coverage [email or web address] Password [email or web address]

## 2019-05-20 NOTE — Progress Notes (Signed)
CSW spoke with [place] in admissions at [facility] who states this patient's insurance authorization has not been received yet. Kenney Houseman reports all clinical information was submitted for authorization to [employer].  CSW spoke with Amy, RN to inform her of information.  Madilyn Fireman, MSW, LCSW-A Transitions of Care  Clinical Social Worker  [facility]  Medical ICU [phone]

## 2019-05-20 NOTE — Evaluation (Signed)
Occupational Therapy Evaluation Patient Details Name: Michael Riggs MRN: [id number] DOB: 12/23/1970 Today's Date: 05/20/2019    History of Present Illness 48 y.o. male with medical history significant for esrd recently started on hemodialysis (m/w/f/sat), dm diet controlled, left BKA, obesity, HTN, calciphylaxis who presented 05/14/19 to ED with rectal bleeding. Discharged earlier this month after hospital course for LE infection c/b sepsis and code blue with intubation, icu stay, use of pressors. Discharged to rehab. 05/15/19 flex sigmoidoscopy found rectal ulcer and injected for hemostasis. Transfusion to keep Hgb >7.0   Clinical Impression   Patient admitted for above and limited by problem list below, including pain, decreased activity tolerance, generalized weakness, and impaired coordination in B hands.  PTA he reports needing assist with ADLS from bed level. Patient declined OOB, EOB or rolling in bed activity, therefor eval limited.  From bed level, patient able to engage in grooming with setup assist, anticipate UB ADLs mod assist and LB ADLs max-total assist.  He is eager to return to SNF today to continue making progress with therapists he knows.  Will defer further OT services to SNF to maximize independence with ADLs and mobility.     Follow Up Recommendations  SNF;Supervision/Assistance - 24 hour    Equipment Recommendations  Other (comment)(TBD at next venue of care)    Recommendations for Other Services       Precautions / Restrictions Precautions Precautions: Fall Restrictions Weight Bearing Restrictions: Yes LLE Weight Bearing: Non weight bearing      Mobility Bed Mobility               General bed mobility comments: pt declined to reposition, roll or sit EOB  Transfers                 General transfer comment: pt declined    Balance                                           ADL either performed or assessed with  clinical judgement   ADL Overall ADL's : Needs assistance/impaired     Grooming: Set up;Bed level   Upper Body Bathing: Moderate assistance;Bed level   Lower Body Bathing: Total assistance   Upper Body Dressing : Maximal assistance;Bed level   Lower Body Dressing: Total assistance;Bed level     Toilet Transfer Details (indicate cue type and reason): deferred, pt declined           General ADL Comments: pt declined OOB or bed mobility, ADLs bed level     Vision   Additional Comments: appears [facility]      Perception     Praxis      Pertinent Vitals/Pain Pain Assessment: 0-10 Pain Score: 10-Worst pain ever Pain Location: buttocks, BLEs   Pain Intervention(s): Limited activity within patient's tolerance;Monitored during session     Hand Dominance Right   Extremity/Trunk Assessment Upper Extremity Assessment Upper Extremity Assessment: Generalized weakness;RUE deficits/detail;LUE deficits/detail RUE Deficits / Details: 3+/5 generalized weakness, decreased FMC and fingertip numbness  RUE Sensation: decreased light touch RUE Coordination: decreased fine motor LUE Deficits / Details: 3+/5 generalized weakness, decreased FMC and fingertip numbness  LUE Sensation: decreased light touch LUE Coordination: decreased fine motor   Lower Extremity Assessment Lower Extremity Assessment: Defer to PT evaluation   Cervical / Trunk Assessment Cervical / Trunk Assessment: Other exceptions Cervical / Trunk Exceptions: increased  body habitus   Communication Communication Communication: No difficulties   Cognition Arousal/Alertness: Awake/alert Behavior During Therapy: WFL for tasks assessed/performed Overall Cognitive Status: No family/caregiver present to determine baseline cognitive functioning Area of Impairment: Awareness;Problem solving;Following commands                       Following Commands: Follows one step commands consistently;Follows multi-step commands  with increased time   Awareness: Emergent Problem Solving: Requires verbal cues General Comments: limited assessment, oriented and aware of situation, encouragement to participate    General Comments       Exercises     Shoulder Instructions      Home Living Family/patient expects to be discharged to:: Skilled nursing facility                                 Additional Comments: PTA was receiving rehab at SNF      Prior Functioning/Environment Level of Independence: Needs assistance  Gait / Transfers Assistance Needed: working on standing and UE strength. Doing pivot to wc no device. Can push himself in the wc "a little"  ADL's / Homemaking Assistance Needed: sponge bathing, dressing, toileting from bed level with assist             OT Problem List: Decreased strength;Decreased activity tolerance;Impaired balance (sitting and/or standing);Decreased cognition;Decreased safety awareness;Decreased knowledge of precautions;Obesity;Pain;Decreased coordination      OT Treatment/Interventions:      OT Goals(Current goals can be found in the care plan section) Acute Rehab OT Goals Patient Stated Goal: to get back to rehab OT Goal Formulation: With patient  OT Frequency:     Barriers to D/C:            Co-evaluation              AM-PAC OT "6 Clicks" Daily Activity     Outcome Measure Help from another person eating meals?: A Little Help from another person taking care of personal grooming?: A Little Help from another person toileting, which includes using toliet, bedpan, or urinal?: Total Help from another person bathing (including washing, rinsing, drying)?: A Lot Help from another person to put on and taking off regular upper body clothing?: A Lot Help from another person to put on and taking off regular lower body clothing?: Total 6 Click Score: 12   End of Session Nurse Communication: Mobility status  Activity Tolerance: Patient tolerated  treatment well Patient left: in bed;with call bell/phone within reach  OT Visit Diagnosis: Other abnormalities of gait and mobility (R26.89);Muscle weakness (generalized) (M62.81);Pain Pain - part of body: Leg(buttocks)                Time: [id number] OT Time Calculation (min): 19 min Charges:  OT General Charges $OT Visit: 1 Visit OT Evaluation $OT Eval Moderate Complexity: [place], OT Acute Rehabilitation Services Pager [phone] Office [phone]   Delight Stare 05/20/2019, 9:26 AM

## 2019-05-20 NOTE — Progress Notes (Signed)
Michael Riggs  Subjective: doing well, Hb stable   Vitals:   05/20/19 0400 05/20/19 0832 05/20/19 0835 05/20/19 1150  BP: (!) 149/87 (!) 151/97  (!) 149/96  Pulse: 97 91  90  Resp: (!) 22   16  Temp: 97.8 F (36.6 C)  98 F (36.7 C) 98.2 F (36.8 C)  TempSrc:   Oral Oral  SpO2: 99% 97%  92%  Weight:      Height:        Inpatient medications: . sodium chloride   Intravenous Once  . bethanechol  25 mg Oral TID  . Chlorhexidine Gluconate Cloth  6 each Topical Q0600  . cholestyramine  8 g Oral BID  . DULoxetine  40 mg Oral Daily  . Gerhardt's butt cream   Topical BID  . insulin aspart  0-5 Units Subcutaneous QHS  . insulin aspart  0-9 Units Subcutaneous TID WC  . Melatonin  4.5 mg Oral QHS  . midodrine  10 mg Oral Q M,W,F  . pantoprazole  40 mg Oral Daily  . tamsulosin  0.4 mg Oral QPC breakfast   . ferric gluconate (FERRLECIT/NULECIT) IV 125 mg (05/19/19 1425)  . sodium thiosulfate infusion for calciphylaxis 25 g (05/19/19 1521)   albuterol, ondansetron (ZOFRAN) IV, oxyCODONE, traMADol    Exam: General: NAD, pale, chronically ill appearing male, laying in bed Head: NCAT sclera not icteric, slightly injected. MMM Neck: Supple. No lymphadenopathy Lungs: CTA bilaterally. No wheeze, rales or rhonchi. Breathing is unlabored. Heart: RRR. No murmur, rubs or gallops.  Abdomen: soft, nontender, +BS, no guarding, no rebound tenderness Lower extremities:no edema, L BKA, multiple calciphylaxis wounds on b/l thighs, R calf in ace wrap. Neuro: AAOx3. Moves all extremities spontaneously. Psych:  Responds to questions appropriately with a normal affect. Dialysis Access: R IJ TDC, LU AVF maturing  Dialysis Orders:  [facility] - [facility]  4h  107kg  2/2 bath Hep 3000  RIJ TDC/ LUA AVF maturing (be sure to change to 2K/2Ca bath at OP unit, needs Ca lowering for calciphylaxis)   Mircera 100 mcg q2wks - last 10/16 Venofer 100mg  x10 - 2 of 10 completed Sodium  thiosulfate 25mg  qHD  Assessment/Plan: 1.  Hematochezia - Flex sig done 10/26 showed blood in rectum/ sigmoid and solitary rectal ulcer sp inject/bicap/ clipping per GI. Felt to be stercoral ulcer from constipation. SP 4u prbc total. Hb stable mid 8's. Awaiting return to SNF. 2.  ESRD -  On HD MWFS. No extra HD today due to staffing. HD Monday if still here.   3.  Anemia of CKD - Continue iron load. Due for ESA on 10/30.  4.  Secondary Hyperparathyroidism -  Ca at goal. Will check phos.  NO calcium/Vit D because of calciphylaxis.  5.  Nutrition - Renal diet w/fluid restriction when no longer NPO 6. Calciphylaxis - recent issue, appears to be grossly improving. Cont Na thiosulfate, low Ca 2.0 bath on HD (important), and avoid all Ca/ vit D products 7. DM - per primary 8. Dispo - awaiting authorization to return to SNF     Michael Riggs 05/20/2019, 12:39 PM  Iron/TIBC/Ferritin/ %Sat    Component Value Date/Time   IRON 19 (L) 05/17/2019 0830   TIBC 127 (L) 05/17/2019 0830   FERRITIN 205 05/17/2019 0830   IRONPCTSAT 15 (L) 05/17/2019 0830   Recent Labs  Lab 05/20/19 0540  NA 137  K 3.5  CL 97*  CO2 21*  GLUCOSE 75  75  BUN 15  CREATININE 3.35*  CALCIUM 8.1*  PHOS 4.0  ALBUMIN 2.0*   Recent Labs  Lab 05/14/19 1323  AST 26  ALT 12  ALKPHOS 72  BILITOT 1.1  PROT 7.1   Recent Labs  Lab 05/16/19 0627  05/20/19 0540  WBC 8.7  --   --   HGB 7.6*   < > 8.8*  HCT 24.6*   < > 27.2*  PLT 295  --   --    < > = values in this interval not displayed.

## 2019-05-21 LAB — RENAL FUNCTION PANEL
Albumin: 2.1 g/dL — ABNORMAL LOW (ref 3.5–5.0)
Anion gap: 18 — ABNORMAL HIGH (ref 5–15)
BUN: 21 mg/dL — ABNORMAL HIGH (ref 6–20)
CO2: 19 mmol/L — ABNORMAL LOW (ref 22–32)
Calcium: 8.6 mg/dL — ABNORMAL LOW (ref 8.9–10.3)
Chloride: 97 mmol/L — ABNORMAL LOW (ref 98–111)
Creatinine, Ser: 4.42 mg/dL — ABNORMAL HIGH (ref 0.61–1.24)
GFR calc Af Amer: 17 mL/min — ABNORMAL LOW (ref >60)
GFR calc non Af Amer: 15 mL/min — ABNORMAL LOW (ref >60)
Glucose, Bld: 75 mg/dL (ref 70–99)
Phosphorus: 5.7 mg/dL — ABNORMAL HIGH (ref 2.5–4.6)
Potassium: 4.1 mmol/L (ref 3.5–5.1)
Sodium: 134 mmol/L — ABNORMAL LOW (ref 135–145)

## 2019-05-21 LAB — GLUCOSE, CAPILLARY
Glucose-Capillary: 72 mg/dL (ref 70–99)
Glucose-Capillary: 103 mg/dL — ABNORMAL HIGH (ref 70–99)
Glucose-Capillary: 147 mg/dL — ABNORMAL HIGH (ref 70–99)
Glucose-Capillary: 74 mg/dL (ref 70–99)

## 2019-05-21 LAB — HEMOGLOBIN AND HEMATOCRIT, BLOOD
HCT: 27.2 % — ABNORMAL LOW (ref 39.0–52.0)
Hemoglobin: 8.8 g/dL — ABNORMAL LOW (ref 13.0–17.0)

## 2019-05-21 LAB — SARS CORONAVIRUS 2 (TAT 6-24 HRS): SARS Coronavirus 2: NEGATIVE

## 2019-05-21 MED ORDER — DARBEPOETIN ALFA 100 MCG/0.5ML IJ SOSY: 100.0000 ug | PREFILLED_SYRINGE | INTRAMUSCULAR | Status: DC

## 2019-05-21 MED ORDER — CHLORHEXIDINE GLUCONATE CLOTH 2 % EX PADS
6.0000 | MEDICATED_PAD | Freq: Every day | CUTANEOUS | Status: DC
  Administered 2019-05-21 – 2019-05-23 (×3): 6 via TOPICAL

## 2019-05-21 MED ORDER — DARBEPOETIN ALFA 100 MCG/0.5ML IJ SOSY
100.0000 ug | PREFILLED_SYRINGE | Freq: Once | INTRAMUSCULAR | Status: AC
  Administered 2019-05-21: 14:00:00 100 ug via SUBCUTANEOUS
  Filled 2019-05-21: qty 0.5

## 2019-05-21 NOTE — Plan of Care (Signed)
  Problem: Education: Goal: Knowledge of General Education information will improve Description: Including pain rating scale, medication(s)/side effects and non-pharmacologic comfort measures Outcome: Progressing   Problem: Health Behavior/Discharge Planning: Goal: Ability to manage health-related needs will improve Outcome: Progressing   Problem: Clinical Measurements: Goal: Ability to maintain clinical measurements within normal limits will improve Outcome: Progressing Goal: Will remain free from infection Outcome: Progressing Goal: Diagnostic test results will improve Outcome: Progressing Goal: Respiratory complications will improve Outcome: Progressing Goal: Cardiovascular complication will be avoided Outcome: Progressing   Problem: Coping: Goal: Level of anxiety will decrease Outcome: Progressing   Problem: Pain Managment: Goal: General experience of comfort will improve Outcome: Progressing   Problem: Safety: Goal: Ability to remain free from injury will improve Outcome: Progressing   Problem: Skin Integrity: Goal: Risk for impaired skin integrity will decrease Outcome: Progressing   Problem: Education: Goal: Ability to identify signs and symptoms of gastrointestinal bleeding will improve Outcome: Progressing   Problem: Bowel/Gastric: Goal: Will show no signs and symptoms of gastrointestinal bleeding Outcome: Progressing   Problem: Fluid Volume: Goal: Will show no signs and symptoms of excessive bleeding Outcome: Progressing   Problem: Clinical Measurements: Goal: Complications related to the disease process, condition or treatment will be avoided or minimized Outcome: Progressing

## 2019-05-21 NOTE — Progress Notes (Addendum)
[facility] Progress Note  Subjective: doing well, Hb stable   Vitals:   05/20/19 2130 05/20/19 2213 05/21/19 0031 05/21/19 0536  BP:  (!) 154/93  (!) 152/98  Pulse: 96 96 97 90  Resp:  (!) 22 (!) 24   Temp:  97.7 F (36.5 C)  97.6 F (36.4 C)  TempSrc:  Oral  Oral  SpO2: 100% 100% 97% 99%  Weight:      Height:        Inpatient medications: . sodium chloride   Intravenous Once  . bethanechol  25 mg Oral TID  . Chlorhexidine Gluconate Cloth  6 each Topical Q0600  . cholestyramine  8 g Oral BID  . DULoxetine  40 mg Oral Daily  . Gerhardt's butt cream   Topical BID  . insulin aspart  0-5 Units Subcutaneous QHS  . insulin aspart  0-9 Units Subcutaneous TID WC  . Melatonin  4.5 mg Oral QHS  . midodrine  10 mg Oral Q M,W,F  . pantoprazole  40 mg Oral Daily  . tamsulosin  0.4 mg Oral QPC breakfast   . ferric gluconate (FERRLECIT/NULECIT) IV 125 mg (05/19/19 1425)  . sodium thiosulfate infusion for calciphylaxis 25 g (05/19/19 1521)   albuterol, ondansetron (ZOFRAN) IV, oxyCODONE, traMADol    Exam: General: NAD, pale, chronically ill appearing male, laying in bed Head: NCAT sclera not icteric, slightly injected. MMM Neck: Supple. No lymphadenopathy Lungs: CTA bilaterally. No wheeze, rales or rhonchi. Breathing is unlabored. Heart: RRR. No murmur, rubs or gallops.  Abdomen: soft, nontender, +BS, no guarding, no rebound tenderness Lower extremities: +woody edema bilat abd wall and dependent hip areas, L BKA, multiple calciphylaxis wounds on b/l thighs appear to be slowly healing, R calf in ace wrap. Neuro: AAOx3. Moves all extremities spontaneously. Psych:  Responds to questions appropriately with a normal affect. Dialysis Access: R IJ TDC, LU AVF maturing  Dialysis Orders:  MW[facility] - [facility]  4h  107kg  2/2 bath Hep 3000  RIJ TDC/ LUA AVF maturing (be sure to change to 2K/2Ca bath at OP unit, needs Ca lowering for calciphylaxis)   Mircera 100 mcg q2wks - last  10/16 Venofer 100mg  x10 - 2 of 10 completed Sodium thiosulfate 25mg  qHD  Assessment/Plan: 1.  Hematochezia - Flex sig done 10/26 showed blood in rectum/ sigmoid and solitary rectal ulcer sp inject/bicap/ clipping per GI. Felt to be stercoral ulcer from constipation. SP 4u prbc total. Hb stable mid 8's. Awaiting return to SNF. 2.  ESRD -  On HD MWFS. Skipped extra HD Sat due to staffing. HD Monday. Using [facility], has L AVF maturing.  Use po KCL for low K+ so we can continue using 2/2 bath for #6.  3. HTN/ volume - no BP's but BP's high lately and has sig dependent edema on exam, at dry wt but has prob lost sig body wt > plan ^UF this week and cont pre HD midodrine for now.  4.  Anemia of CKD - Continue iron load. Due for esa, will give darbe 100 ug today sq and then weekly if still here.  5.  Secondary Hyperparathyroidism -  Ca at goal. Will check phos.  NO calcium/Vit D because of calciphylaxis.  6.  Nutrition - Renal diet w/fluid restriction when no longer NPO 7. Calciphylaxis - severe case bilat LE's, recent issue, appears to be grossly improving. Cont Na thiosulfate, low Ca 2.0 bath on HD (important), and avoid all Ca/ vit D products 8. DM -  per primary 9. Dispo - awaiting authorization to return to SNF, repeat COVID neg 10/29     Rob Hazel Leveille 05/21/2019, 11:25 AM  Iron/TIBC/Ferritin/ %Sat    Component Value Date/Time   IRON 19 (L) 05/17/2019 0830   TIBC 127 (L) 05/17/2019 0830   FERRITIN 205 05/17/2019 0830   IRONPCTSAT 15 (L) 05/17/2019 0830   Recent Labs  Lab 05/21/19 0757  NA 134*  K 4.1  CL 97*  CO2 19*  GLUCOSE 75  BUN 21*  CREATININE 4.42*  CALCIUM 8.6*  PHOS 5.7*  ALBUMIN 2.1*   Recent Labs  Lab 05/14/19 1323  AST 26  ALT 12  ALKPHOS 72  BILITOT 1.1  PROT 7.1   Recent Labs  Lab 05/16/19 0627  05/21/19 0757  WBC 8.7  --   --   HGB 7.6*   < > 8.8*  HCT 24.6*   < > 27.2*  PLT 295  --   --    < > = values in this interval not displayed.

## 2019-05-21 NOTE — Progress Notes (Signed)
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   In ED, hemodynamically stable.  Hgb 9.2.  CT abdomen and pelvis revealed rectal wall thickening.  GI consulted.  Empirically started on ceftriaxone and Flagyl for possible proctitis.  Gastroenterology and nephrology consulted.  Patient underwent flex sigmoidoscopy on the evening of 10/26, was found to have a bleeding rectal ulcer, hemostasis was obtained with epinephrine injections and clips were placed.  Hemoglobin dropped to 6.5.  He received a total of 4 units throughout his hospitalization.  He also received IV iron.  Finally, hemoglobin remained stable.  GI emphasized the importance of avoiding constipation.  GI also recommended colonoscopy outpatient.   Patient is a stable for discharge.  Waiting on SNF bed and insurance authorization.   Subjective: No major events overnight of this morning.  Reports some reflux symptoms.  Denies chest pain, dyspnea, nausea, vomiting or abdominal pain.  Having regular bowel movements.  Objective: Vitals:   05/20/19 2213 05/21/19 0031 05/21/19 0536 05/21/19 1345  BP: (!) 154/93  (!) 152/98 (!) 160/96  Pulse: 96 97 90 87  Resp: (!) 22 (!) 24  18  Temp: 97.7 F (36.5 C)  97.6 F (36.4 C) 97.7 F (36.5 C)  TempSrc: Oral  Oral Oral  SpO2: 100% 97% 99% 100%  Weight:      Height:        Intake/Output Summary (Last 24 hours) at 05/21/2019 1607 Last data filed at 05/20/2019 2133 Gross per 24 hour  Intake 472 ml  Output -  Net 472 ml   Filed Weights   05/17/19 1206 05/19/19 1252 05/19/19 1626  Weight: 107.5 kg 109.2 kg 107.9 kg    Examination:  GENERAL: No acute distress.  Appears  well.  HEENT: MMM.  Vision and hearing grossly intact.  NECK: Supple.  No apparent JVD.  RESP:  No IWOB. Good air movement bilaterally. CVS:  RRR. Heart sounds normal.  ABD/GI/GU: Bowel sounds present. Soft. Non tender.  MSK/EXT: Left BKA SKIN: Calciphylaxis and skin ulceration over bilateral lower extremities NEURO: Awake, alert and oriented appropriately.  No apparent focal neuro deficit. PSYCH: Calm. Normal affect.  Assessment & Plan: Acute blood loss anemia due to rectal bleed from bleeding rectal ulcer superimposed on anemia of chronic disease.  Baseline Hgb 8-9. 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  Diet controlled diabetes CBG (last 3)  Recent Labs    05/20/19 2143 05/21/19 0743 05/21/19 1135  GLUCAP 86 72 103*  -Continue current regimen  Chronic hypotension/essential hypertension -Continue midodrine with HD.  Hyperlipidemia -Continue home statin  Depression/neuropathic pain -Continue Cymbalta and as needed tramadol.  BPH/LUTS: still makes some urine. -Continue tamsulosin and urecholine  OSA on CPAP -Continue nightly CPAP.  GERD -Continue PPI  Prolonged QTC -Minimize QT prolonging drugs.  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 (Active)  04/26/19 2321  Location:   Location Orientation: Left  Staging: Stage II -  Partial  thickness loss of dermis presenting as a shallow open ulcer with a red, pink wound bed without slough.  Wound Description (Comments):   Present on Admission: Yes     DVT prophylaxis: None due to GI bleed and BKA and lower extremity ulceration Code Status: Full code Family Communication: Patient and/or RN. Available if any question. Disposition Plan: Back to SNF 11/2 after insurance authorization and Covid result. Consultants: Nephrology, GI  Procedures:  10/26-Flex sigmoidoscopy with epinephrine injection and placement of clips for hemostasis  Microbiology summarized: COVID-19 negative.  Sch Meds:  Scheduled Meds: . sodium chloride   Intravenous Once  . bethanechol  25 mg Oral TID  . Chlorhexidine Gluconate Cloth  6 each Topical Q0600  . Chlorhexidine Gluconate Cloth  6 each Topical Q0600  . cholestyramine  8 g Oral BID  . [START ON 05/29/2019] darbepoetin (ARANESP) injection - DIALYSIS  100 mcg Intravenous Q Mon-HD  . DULoxetine  40 mg Oral Daily  . Gerhardt's butt cream   Topical BID  . insulin aspart  0-5 Units Subcutaneous QHS  . insulin aspart  0-9 Units Subcutaneous TID WC  . Melatonin  4.5 mg Oral QHS  . midodrine  10 mg Oral Q M,W,F  . pantoprazole  40 mg Oral Daily  . tamsulosin  0.4 mg Oral QPC breakfast   Continuous Infusions: . ferric gluconate (FERRLECIT/NULECIT) IV 125 mg (05/19/19 1425)  . sodium thiosulfate infusion for calciphylaxis 25 g (05/19/19 1521)   PRN Meds:.albuterol, ondansetron (ZOFRAN) IV, oxyCODONE, traMADol  Antimicrobials: Anti-infectives (From admission, onward)   Start     Dose/Rate Route Frequency Ordered Stop   05/15/19 1000  cefTRIAXone (ROCEPHIN) 2 g in sodium chloride 0.9 % 100 mL IVPB  Status:  Discontinued     2 g 200 mL/hr over 30 Minutes Intravenous Daily 05/14/19 2206 05/16/19 1100   05/15/19 0230  piperacillin-tazobactam (ZOSYN) IVPB 2.25 g  Status:  Discontinued     2.25 g 100 mL/hr over 30 Minutes Intravenous Every 8 hours  05/14/19 1815 05/14/19 2206   05/14/19 2215  metroNIDAZOLE (FLAGYL) IVPB 500 mg  Status:  Discontinued     500 mg 100 mL/hr over 60 Minutes Intravenous Every 8 hours 05/14/19 2206 05/16/19 1100   05/14/19 1830  piperacillin-tazobactam (ZOSYN) IVPB 3.375 g     3.375 g 100 mL/hr over 30 Minutes Intravenous  Once 05/14/19 1815 05/14/19 2000       I have personally reviewed the following labs and images: CBC: Recent Labs  Lab 05/14/19 1801 05/15/19 0406  05/16/19 0627  05/17/19 2058 05/18/19 0414 05/19/19 0623 05/20/19 0540 05/21/19 0757  WBC 13.3* 9.4  --  8.7  --   --   --   --   --   --   HGB 8.4* 6.5*   < > 7.6*   < > 8.7* 8.8* 8.5* 8.8* 8.8*  HCT 27.9* 21.9*   < > 24.6*   < > 26.4* 27.0* 26.6* 27.2* 27.2*  MCV 92.1 93.6  --  92.1  --   --   --   --   --   --   PLT 351 295  --  295  --   --   --   --   --   --    < > =  values in this interval not displayed.   BMP &GFR Recent Labs  Lab 05/16/19 0627 05/17/19 0234 05/18/19 0414 05/19/19 0623 05/20/19 0540 05/21/19 0757  NA 134*  --  136 134* 137 134*  K 4.0  --  3.6 4.1 3.5 4.1  CL 101  --  97* 96* 97* 97*  CO2 23  --  24 22 21* 19*  GLUCOSE 79 95 72  70 68*  66* 75  75 75  BUN 47*  --  13 21* 15 21*  CREATININE 7.57*  --  3.32* 4.42* 3.35* 4.42*  CALCIUM 8.1*  --  7.5* 8.5* 8.1* 8.6*  MG  --   --  1.8  --   --   --   PHOS  --   --  3.0 4.8* 4.0 5.7*   Estimated Creatinine Clearance: 25.4 mL/min (A) (by C-G formula based on SCr of 4.42 mg/dL (H)). Liver & Pancreas: Recent Labs  Lab 05/18/19 0414 05/19/19 0623 05/20/19 0540 05/21/19 0757  ALBUMIN 2.1* 2.1* 2.0* 2.1*   No results for input(s): LIPASE, AMYLASE in the last 168 hours. No results for input(s): AMMONIA in the last 168 hours. Diabetic: No results for input(s): HGBA1C in the last 72 hours. Recent Labs  Lab 05/20/19 1217 05/20/19 1637 05/20/19 2143 05/21/19 0743 05/21/19 1135  GLUCAP 73 92 86 72 103*   Cardiac Enzymes: No results for  input(s): CKTOTAL, CKMB, CKMBINDEX, TROPONINI in the last 168 hours. No results for input(s): PROBNP in the last 8760 hours. Coagulation Profile: No results for input(s): INR, PROTIME in the last 168 hours. Thyroid Function Tests: No results for input(s): TSH, T4TOTAL, FREET4, T3FREE, THYROIDAB in the last 72 hours. Lipid Profile: No results for input(s): CHOL, HDL, LDLCALC, TRIG, CHOLHDL, LDLDIRECT in the last 72 hours. Anemia Panel: No results for input(s): VITAMINB12, FOLATE, FERRITIN, TIBC, IRON, RETICCTPCT in the last 72 hours. Urine analysis:    Component Value Date/Time   COLORURINE YELLOW 03/10/2019 1054   APPEARANCEUR TURBID (A) 03/10/2019 1054   LABSPEC 1.020 03/10/2019 1054   PHURINE 5.0 03/10/2019 1054   GLUCOSEU 50 (A) 03/10/2019 1054   HGBUR SMALL (A) 03/10/2019 1054   BILIRUBINUR NEGATIVE 03/10/2019 1054   [place] 03/10/2019 1054   PROTEINUR >=300 (A) 03/10/2019 1054   NITRITE NEGATIVE 03/10/2019 1054   LEUKOCYTESUR LARGE (A) 03/10/2019 1054   Sepsis Labs: Invalid input(s): PROCALCITONIN, [place]  Microbiology: Recent Results (from the past 240 hour(s))  SARS Coronavirus 2 by RT PCR (hospital order, performed in [facility] hospital lab) Nasopharyngeal Nasopharyngeal Swab     Status: None   Collection Time: 05/14/19  5:42 PM   Specimen: Nasopharyngeal Swab  Result Value Ref Range Status   SARS Coronavirus 2 NEGATIVE NEGATIVE Final    Comment: (NOTE) If result is NEGATIVE SARS-CoV-2 target nucleic acids are NOT DETECTED. The SARS-CoV-2 RNA is generally detectable in upper and lower  respiratory specimens during the acute phase of infection. The lowest  concentration of SARS-CoV-2 viral copies this assay can detect is 250  copies / mL. A negative result does not preclude SARS-CoV-2 infection  and should not be used as the sole basis for treatment or other  patient management decisions.  A negative result may occur with  improper specimen  collection / handling, submission of specimen other  than nasopharyngeal swab, presence of viral mutation(s) within the  areas targeted by this assay, and inadequate number of viral copies  (<250 copies / mL). A negative result  must be combined with clinical  observations, patient history, and epidemiological information. If result is POSITIVE SARS-CoV-2 target nucleic acids are DETECTED. The SARS-CoV-2 RNA is generally detectable in upper and lower  respiratory specimens dur ing the acute phase of infection.  Positive  results are indicative of active infection with SARS-CoV-2.  Clinical  correlation with patient history and other diagnostic information is  necessary to determine patient infection status.  Positive results do  not rule out bacterial infection or co-infection with other viruses. If result is PRESUMPTIVE POSTIVE SARS-CoV-2 nucleic acids MAY BE PRESENT.   A presumptive positive result was obtained on the submitted specimen  and confirmed on repeat testing.  While 2019 novel coronavirus  (SARS-CoV-2) nucleic acids may be present in the submitted sample  additional confirmatory testing may be necessary for epidemiological  and / or clinical management purposes  to differentiate between  SARS-CoV-2 and other Sarbecovirus currently known to infect humans.  If clinically indicated additional testing with an alternate test  methodology [phone]) is advised. The SARS-CoV-2 RNA is generally  detectable in upper and lower respiratory sp ecimens during the acute  phase of infection. The expected result is Negative. Fact Sheet for Patients:  StrictlyIdeas.no Fact Sheet for Healthcare Providers: BankingDealers.co.za This test is not yet approved or cleared by the [place] FDA and has been authorized for detection and/or diagnosis of SARS-CoV-2 by FDA under an Emergency Use Authorization (EUA).  This EUA will remain in effect  (meaning this test can be used) for the duration of the COVID-19 declaration under Section 564(b)(1) of the Act, 21 U.S.C. section 360bbb-3(b)(1), unless the authorization is terminated or revoked sooner. Performed at [facility], [place] [address], [place], [place] [address]   SARS CORONAVIRUS 2 (TAT 6-24 HRS) Nasopharyngeal Nasopharyngeal Swab     Status: None   Collection Time: 05/18/19 10:48 AM   Specimen: Nasopharyngeal Swab  Result Value Ref Range Status   SARS Coronavirus 2 NEGATIVE NEGATIVE Final    Comment: (NOTE) SARS-CoV-2 target nucleic acids are NOT DETECTED. The SARS-CoV-2 RNA is generally detectable in upper and lower respiratory specimens during the acute phase of infection. Negative results do not preclude SARS-CoV-2 infection, do not rule out co-infections with other pathogens, and should not be used as the sole basis for treatment or other patient management decisions. Negative results must be combined with clinical observations, patient history, and epidemiological information. The expected result is Negative. Fact Sheet for Patients: SugarRoll.be Fact Sheet for Healthcare Providers: https://www.woods-mathews.com/ This test is not yet approved or cleared by the [place] FDA and  has been authorized for detection and/or diagnosis of SARS-CoV-2 by FDA under an Emergency Use Authorization (EUA). This EUA will remain  in effect (meaning this test can be used) for the duration of the COVID-19 declaration under Section 56 4(b)(1) of the Act, 21 U.S.C. section 360bbb-3(b)(1), unless the authorization is terminated or revoked sooner. Performed at [facility], [place] [address] [place], [place] [address]     Radiology Studies: No results found.  Gabriella Guile T. Malcolm  If 7PM-7AM, please contact night-coverage [email or web address] Password [email or web address] 05/21/2019, 4:07 PM

## 2019-05-21 NOTE — Progress Notes (Signed)
Patient arrived to 6N26 from 4N. Placed on 3L O2 [place]. Pt. is A&O x4 with VSS and has no comlaints at this time. Oriented to room and to call bell. Will call RT for pt. to be placed on CPAP.

## 2019-05-21 NOTE — Progress Notes (Signed)
RT placed pt on CPAP dream station for the night on 10 cmH2O for his comfort. Pts normal home setting is 7 cmH2O. Pt slightly tachypneic prior to placement of CPAP. RT filled humidification reservoir and bled in 3 Lpm O2. RT will continue to monitor.

## 2019-05-22 LAB — RENAL FUNCTION PANEL
Albumin: 2.1 g/dL — ABNORMAL LOW (ref 3.5–5.0)
Anion gap: 14 (ref 5–15)
BUN: 26 mg/dL — ABNORMAL HIGH (ref 6–20)
CO2: 22 mmol/L (ref 22–32)
Calcium: 8.5 mg/dL — ABNORMAL LOW (ref 8.9–10.3)
Chloride: 98 mmol/L (ref 98–111)
Creatinine, Ser: 5.25 mg/dL — ABNORMAL HIGH (ref 0.61–1.24)
GFR calc Af Amer: 14 mL/min — ABNORMAL LOW (ref >60)
GFR calc non Af Amer: 12 mL/min — ABNORMAL LOW (ref >60)
Glucose, Bld: 80 mg/dL (ref 70–99)
Phosphorus: 6 mg/dL — ABNORMAL HIGH (ref 2.5–4.6)
Potassium: 4.2 mmol/L (ref 3.5–5.1)
Sodium: 134 mmol/L — ABNORMAL LOW (ref 135–145)

## 2019-05-22 LAB — GLUCOSE, CAPILLARY
Glucose-Capillary: 62 mg/dL — ABNORMAL LOW (ref 70–99)
Glucose-Capillary: 106 mg/dL — ABNORMAL HIGH (ref 70–99)
Glucose-Capillary: 152 mg/dL — ABNORMAL HIGH (ref 70–99)
Glucose-Capillary: 93 mg/dL (ref 70–99)

## 2019-05-22 LAB — CBC
HCT: 27.1 % — ABNORMAL LOW (ref 39.0–52.0)
Hemoglobin: 8.5 g/dL — ABNORMAL LOW (ref 13.0–17.0)
MCH: 28.6 pg (ref 26.0–34.0)
MCHC: 31.4 g/dL (ref 30.0–36.0)
MCV: 91.2 fL (ref 80.0–100.0)
Platelets: 323 10*3/uL (ref 150–400)
RBC: 2.97 MIL/uL — ABNORMAL LOW (ref 4.22–5.81)
RDW: 15.5 % (ref 11.5–15.5)
WBC: 5.9 10*3/uL (ref 4.0–10.5)
nRBC: 0 % (ref 0.0–0.2)

## 2019-05-22 LAB — HEMOGLOBIN AND HEMATOCRIT, BLOOD
HCT: 26.5 % — ABNORMAL LOW (ref 39.0–52.0)
Hemoglobin: 8.5 g/dL — ABNORMAL LOW (ref 13.0–17.0)

## 2019-05-22 MED ORDER — HEPARIN SODIUM (PORCINE) 1000 UNIT/ML IJ SOLN
INTRAMUSCULAR | Status: AC
  Filled 2019-05-22: qty 1

## 2019-05-22 NOTE — Procedures (Signed)
I was present at this dialysis session. I have reviewed the session itself and made appropriate changes.   Vital signs in last 24 hours:  Temp:  [96.7 F (35.9 C)-97.7 F (36.5 C)] 96.9 F (36.1 C) (11/02 0525) Pulse Rate:  [87-95] 88 (11/02 0525) Resp:  [16-20] 20 (11/02 0525) BP: (144-160)/(83-103) 144/83 (11/02 0525) SpO2:  [94 %-100 %] 98 % (11/02 0525) Weight change:  Filed Weights   05/17/19 1206 05/19/19 1252 05/19/19 1626  Weight: 107.5 kg 109.2 kg 107.9 kg    Recent Labs  Lab 05/22/19 0406  NA 134*  K 4.2  CL 98  CO2 22  GLUCOSE 80  BUN 26*  CREATININE 5.25*  CALCIUM 8.5*  PHOS 6.0*    Recent Labs  Lab 05/16/19 0627  05/21/19 0757 05/22/19 0406 05/22/19 0718  WBC 8.7  --   --   --  5.9  HGB 7.6*   < > 8.8* 8.5* 8.5*  HCT 24.6*   < > 27.2* 26.5* 27.1*  MCV 92.1  --   --   --  91.2  PLT 295  --   --   --  323   < > = values in this interval not displayed.    Scheduled Meds: . sodium chloride   Intravenous Once  . bethanechol  25 mg Oral TID  . Chlorhexidine Gluconate Cloth  6 each Topical Q0600  . Chlorhexidine Gluconate Cloth  6 each Topical Q0600  . cholestyramine  8 g Oral BID  . [START ON 05/29/2019] darbepoetin (ARANESP) injection - DIALYSIS  100 mcg Intravenous Q Mon-HD  . DULoxetine  40 mg Oral Daily  . Gerhardt's butt cream   Topical BID  . insulin aspart  0-5 Units Subcutaneous QHS  . insulin aspart  0-9 Units Subcutaneous TID WC  . Melatonin  4.5 mg Oral QHS  . midodrine  10 mg Oral Q M,W,F  . pantoprazole  40 mg Oral Daily  . tamsulosin  0.4 mg Oral QPC breakfast   Continuous Infusions: . ferric gluconate (FERRLECIT/NULECIT) IV 125 mg (05/19/19 1425)  . sodium thiosulfate infusion for calciphylaxis 25 g (05/19/19 1521)   PRN Meds:.albuterol, ondansetron (ZOFRAN) IV, oxyCODONE, traMADol    Dialysis Orders: MWFS- [facility] 4h  107kg  2/2 bath Hep 3000  RIJ TDC/ LUA AVF maturing (be sure to change to 2K/2Ca bath at OP unit, needs Ca  lowering for calciphylaxis)   Mircera11mcg q2wks - last 10/16 Venofer 100mg  x10 - 2 of 10 completed Sodium thiosulfate 25mg  qHD  Assessment and plan: 1. Hematochezia- s/p flex sig 05/15/19 with blood in rectum, solitary rectal ulcer s/p injection. S/p 4 units PRBCs hgb stable 2. ESRD- cont with HD qMWF.   3. HTN/Volume- still with anasarca of abdomen/presacrum and UF as tolerated with HD 4. Anemia of CKD as above 5. SHPTH- cont with outpatient meds and renal diet 6. Calciphylaxis- severe bilateral lower ext, cont with Na thiosulfate and low Ca bath, no vit D. 7. Disposition- hopeful discharge back to SNF today.  Donetta Potts,  MD 05/22/2019, 8:25 AM

## 2019-05-22 NOTE — Progress Notes (Signed)
Physical Therapy Treatment Patient Details Name: Michael Riggs MRN: [id number] DOB: 1970/10/19 Today's Date: 05/22/2019    History of Present Illness 48 y.o. male with medical history significant for esrd recently started on hemodialysis (m/w/f/sat), dm diet controlled, left BKA, obesity, HTN, calciphylaxis who presented 05/14/19 to ED with rectal bleeding. Discharged earlier this month after hospital course for LE infection c/b sepsis and code blue with intubation, icu stay, use of pressors. Discharged to rehab. 05/15/19 flex sigmoidoscopy found rectal ulcer and injected for hemostasis. Transfusion to keep Hgb >7.0    PT Comments    Patient sleeping on arrival. On awakening he was willing to work with PT. Able to progress to EOB as pt with less pain compared to last visit. Tolerated sitting EOB for strengthening and balance activities (able to reach with LUE ~4" anterolaterally to grasp object) x 10+ minutes. Patient eager to return to SNF so he will get more therapy.    Follow Up Recommendations  SNF     Equipment Recommendations  Other (comment)(TBA at SNF)    Recommendations for Other Services       Precautions / Restrictions Precautions Precautions: Fall Precaution Comments: Lft BKA with prosthesis; unable to apply prosthetic d/t wounds on LLE Required Braces or Orthoses: (cannot use prosthesis due to left thigh wound) Restrictions Weight Bearing Restrictions: Yes LLE Weight Bearing: Non weight bearing    Mobility  Bed Mobility Overal bed mobility: Needs Assistance Bed Mobility: Supine to Sit;Sit to Supine Rolling: Min guard Sidelying to sit: Min assist(rail, HOB lowered)   Sit to supine: Supervision      Transfers Overall transfer level: Needs assistance   Transfers: Lateral/Scoot Transfers          Lateral/Scoot Transfers: Min guard General transfer comment: along EOB to his left with bil UE support; slight imbalance, however able to recover  himself  Ambulation/Gait             General Gait Details: unable   Stairs             Wheelchair Mobility    Modified Rankin (Stroke Patients Only)       Balance Overall balance assessment: Needs assistance Sitting-balance support: No upper extremity supported;Feet unsupported Sitting balance-Leahy Scale: Good Sitting balance - Comments: tolerated perturbations and isometric push and release each direction with no LOB; trunk strength at least 3+ to 4/5                                    Cognition Arousal/Alertness: Awake/alert Behavior During Therapy: WFL for tasks assessed/performed Overall Cognitive Status: Within Functional Limits for tasks assessed                                 General Comments: pt answering appropriately, good awareness of lines and deficits      Exercises General Exercises - Lower Extremity Long Arc Quad: AAROM;Right;5 reps;Seated Shoulder Exercises Shoulder Extension: Strengthening;Both;Seated(isometric from shoulders flexed) Other Exercises Other Exercises: passive bilat knee extension stretches, pt noted to have bilat knee contractures beginning; in supine, hamstring stretches Other Exercises: resisted elbow flexion/extension; resisted trunk extension/flexion     General Comments General comments (skin integrity, edema, etc.): Pt reports he has begun to sit EOB to eat his meals. Denies transferring to the chair and he deferred this afternoon due to was napping when PT arrived  and just doesn't feel he can transfer with +1 assist safely      Pertinent Vitals/Pain Pain Assessment: Faces Faces Pain Scale: Hurts even more Pain Location: BLEs   Pain Descriptors / Indicators: Discomfort Pain Intervention(s): Limited activity within patient's tolerance;Monitored during session;Repositioned    Home Living Family/patient expects to be discharged to:: Skilled nursing facility Living Arrangements: Other  (Comment)(SNIF)             Additional Comments: PTA was receiving rehab at SNF    Prior Function Level of Independence: Needs assistance  Gait / Transfers Assistance Needed: working on standing and UE strength. Doing pivot to wc no device. Can push himself in the wc "a little"  ADL's / Homemaking Assistance Needed: sponge bathing, dressing, toileting from bed level with assist  Comments: prior to admission 3 months ago   PT Goals (current goals can now be found in the care plan section) Acute Rehab PT Goals Patient Stated Goal: to get back to rehab PT Goal Formulation: With patient Time For Goal Achievement: 06/01/19 Potential to Achieve Goals: Fair Progress towards PT goals: Progressing toward goals    Frequency    Min 2X/week      PT Plan Current plan remains appropriate    Co-evaluation              AM-PAC PT "6 Clicks" Mobility   Outcome Measure  Help needed turning from your back to your side while in a flat bed without using bedrails?: A Little Help needed moving from lying on your back to sitting on the side of a flat bed without using bedrails?: A Little Help needed moving to and from a bed to a chair (including a wheelchair)?: Total Help needed standing up from a chair using your arms (e.g., wheelchair or bedside chair)?: Total Help needed to walk in hospital room?: Total Help needed climbing 3-5 steps with a railing? : Total 6 Click Score: 10    End of Session   Activity Tolerance: Patient tolerated treatment well Patient left: in bed;with call bell/phone within reach;with bed alarm set   PT Visit Diagnosis: Muscle weakness (generalized) (M62.81);Difficulty in walking, not elsewhere classified (R26.2);Pain Pain - Right/Left: (bil legs) Pain - part of body: Leg     Time: [id number] PT Time Calculation (min) (ACUTE ONLY): 20 min  Charges:  $Therapeutic Exercise: 8-22 mins                      Barry Brunner, PT Pager [phone]    Rexanne Mano 05/22/2019, 3:41 PM

## 2019-05-22 NOTE — TOC Progression Note (Signed)
Transition of Care [facility]) - Progression Note    Patient Details  Name: Michael Riggs MRN: [id number] Date of Birth: 14-Nov-1970  Transition of Care [facility]) CM/SW Contact  Eileen Stanford, LCSW Phone Number: 05/22/2019, 2:58 PM  Clinical Narrative:   Pt needs updated PT notes for insurance purposes.    Expected Discharge Plan: Skilled Nursing Facility Barriers to Discharge: Ship broker, Continued Medical Work up, SNF Pending bed offer  Expected Discharge Plan and Services Expected Discharge Plan: [place] In-house Referral: Clinical Social Work     Living arrangements for the past 2 months: Single Family Home Expected Discharge Date: 05/20/19                                     Social Determinants of Health (SDOH) Interventions    Readmission Risk Interventions No flowsheet data found.

## 2019-05-22 NOTE — Progress Notes (Signed)
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   In ED, hemodynamically stable.  Hgb 9.2.  CT abdomen and pelvis revealed rectal wall thickening.  GI consulted.  Empirically started on ceftriaxone and Flagyl for possible proctitis.  Gastroenterology and nephrology consulted.  Patient underwent flex sigmoidoscopy on the evening of 10/26, was found to have a bleeding rectal ulcer, hemostasis was obtained with epinephrine injections and clips were placed.  Hemoglobin dropped to 6.5.  He received a total of 4 units throughout his hospitalization.  He also received IV iron.  Finally, hemoglobin remained stable.  GI emphasized the importance of avoiding constipation.  GI also recommended colonoscopy outpatient.   Patient is a stable for discharge.  Waiting on SNF bed and insurance authorization.   Subjective: No chest pain or shortness of breath. Patient seen while he was receiving dialysis  Objective: Vitals:   05/22/19 1116 05/22/19 1140 05/22/19 1632 05/22/19 1959  BP: (!) 154/87 (!) 150/93 (!) 150/99 (!) 156/96  Pulse: 93 96 93 92  Resp:   16 16  Temp: 98 F (36.7 C) 99 F (37.2 C) 97.6 F (36.4 C) 97.9 F (36.6 C)  TempSrc: Oral Oral Oral Axillary  SpO2:  97% 96% 94%  Weight: 106.4 kg     Height:        Intake/Output Summary (Last 24 hours) at 05/22/2019 2027 Last data filed at 05/22/2019 1700 Gross per 24 hour  Intake 760 ml  Output 4026 ml  Net -3266 ml   Filed Weights   05/19/19 1626 05/22/19 0653 05/22/19 1116  Weight: 107.9 kg 110.4 kg 106.4 kg    Examination: General exam: Alert, awake, oriented x 3 Respiratory system: Clear to auscultation. Respiratory  effort normal. Cardiovascular system:RRR. No murmurs, rubs, gallops. Gastrointestinal system: Abdomen is nondistended, soft and nontender. No organomegaly or masses felt. Normal bowel sounds heard. Central nervous system: Alert and oriented. No focal neurological deficits. Extremities: left BKA Skin: calciphylaxis and ulcerations over LE bilaterally Psychiatry: Judgement and insight appear normal. Mood & affect appropriate.      Assessment & Plan: Acute blood loss anemia due to rectal bleed from bleeding rectal ulcer superimposed on anemia of chronic disease.  Baseline Hgb 8-9. 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  Diet controlled diabetes CBG (last 3)  Recent Labs    05/22/19 1213 05/22/19 1325 05/22/19 1757  GLUCAP 62* 106* 152*  -Continue current regimen  Chronic hypotension/essential hypertension -Continue midodrine with HD.  Hyperlipidemia -Continue home statin  Depression/neuropathic pain -Continue Cymbalta and as needed tramadol.  BPH/LUTS: still makes some urine. -Continue tamsulosin and urecholine  OSA on CPAP -Continue nightly CPAP.  GERD -Continue PPI  Prolonged QTC -Minimize QT prolonging drugs.  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 (Active)  04/26/19 2321  Location:   Location Orientation: Left  Staging: Stage II -   Partial thickness loss of dermis presenting as a shallow open ulcer with a red, pink wound bed without slough.  Wound Description (Comments):   Present on Admission: Yes     DVT prophylaxis: None due to GI bleed and BKA and lower extremity ulceration Code Status: Full code Family Communication: Patient and/or RN. Available if any question. Disposition Plan: Back to SNF 11/2 after insurance authorization and Covid result. Consultants: Nephrology, GI  Procedures:  10/26-Flex sigmoidoscopy with epinephrine injection and placement of clips for hemostasis  Microbiology summarized: COVID-19 negative.  Sch Meds:  Scheduled Meds: . sodium chloride   Intravenous Once  . bethanechol  25 mg Oral TID  . Chlorhexidine Gluconate Cloth  6 each Topical Q0600  . Chlorhexidine Gluconate Cloth  6 each Topical Q0600  . cholestyramine  8 g Oral BID  . [START ON 05/29/2019] darbepoetin (ARANESP) injection - DIALYSIS  100 mcg Intravenous Q Mon-HD  . DULoxetine  40 mg Oral Daily  . Gerhardt's butt cream   Topical BID  . heparin      . insulin aspart  0-5 Units Subcutaneous QHS  . insulin aspart  0-9 Units Subcutaneous TID WC  . Melatonin  4.5 mg Oral QHS  . midodrine  10 mg Oral Q M,W,F  . pantoprazole  40 mg Oral Daily  . tamsulosin  0.4 mg Oral QPC breakfast   Continuous Infusions: . ferric gluconate (FERRLECIT/NULECIT) IV Stopped (05/22/19 1201)  . sodium thiosulfate infusion for calciphylaxis 25 g (05/19/19 1521)   PRN Meds:.albuterol, ondansetron (ZOFRAN) IV, oxyCODONE, traMADol  Antimicrobials: Anti-infectives (From admission, onward)   Start     Dose/Rate Route Frequency Ordered Stop   05/15/19 1000  cefTRIAXone (ROCEPHIN) 2 g in sodium chloride 0.9 % 100 mL IVPB  Status:  Discontinued     2 g 200 mL/hr over 30 Minutes Intravenous Daily 05/14/19 2206 05/16/19 1100   05/15/19 0230  piperacillin-tazobactam (ZOSYN) IVPB 2.25 g  Status:  Discontinued     2.25 g 100 mL/hr over 30 Minutes  Intravenous Every 8 hours 05/14/19 1815 05/14/19 2206   05/14/19 2215  metroNIDAZOLE (FLAGYL) IVPB 500 mg  Status:  Discontinued     500 mg 100 mL/hr over 60 Minutes Intravenous Every 8 hours 05/14/19 2206 05/16/19 1100   05/14/19 1830  piperacillin-tazobactam (ZOSYN) IVPB 3.375 g     3.375 g 100 mL/hr over 30 Minutes Intravenous  Once 05/14/19 1815 05/14/19 2000       I have personally reviewed the following labs and images: CBC: Recent Labs  Lab 05/16/19 0627  05/19/19 0623 05/20/19 0540 05/21/19 0757 05/22/19 0406 05/22/19 0718  WBC 8.7  --   --   --   --   --  5.9  HGB 7.6*   < > 8.5* 8.8* 8.8* 8.5* 8.5*  HCT 24.6*   < > 26.6* 27.2* 27.2* 26.5* 27.1*  MCV 92.1  --   --   --   --   --  91.2  PLT 295  --   --   --   --   --  323   < > = values in this interval not displayed.   BMP &GFR Recent Labs  Lab 05/18/19 0414 05/19/19 [id number] 05/20/19 0540 05/21/19 0757  05/22/19 0406  NA 136 134* 137 134* 134*  K 3.6 4.1 3.5 4.1 4.2  CL 97* 96* 97* 97* 98  CO2 24 22 21* 19* 22  GLUCOSE 72  70 68*  66* 75  75 75 80  BUN 13 21* 15 21* 26*  CREATININE 3.32* 4.42* 3.35* 4.42* 5.25*  CALCIUM 7.5* 8.5* 8.1* 8.6* 8.5*  MG 1.8  --   --   --   --   PHOS 3.0 4.8* 4.0 5.7* 6.0*   Estimated Creatinine Clearance: 21.3 mL/min (A) (by C-G formula based on SCr of 5.25 mg/dL (H)). Liver & Pancreas: Recent Labs  Lab 05/18/19 0414 05/19/19 0623 05/20/19 0540 05/21/19 0757 05/22/19 0406  ALBUMIN 2.1* 2.1* 2.0* 2.1* 2.1*   No results for input(s): LIPASE, AMYLASE in the last 168 hours. No results for input(s): AMMONIA in the last 168 hours. Diabetic: No results for input(s): HGBA1C in the last 72 hours. Recent Labs  Lab 05/21/19 1621 05/21/19 2110 05/22/19 1213 05/22/19 1325 05/22/19 1757  GLUCAP 147* 74 62* 106* 152*   Cardiac Enzymes: No results for input(s): CKTOTAL, CKMB, CKMBINDEX, TROPONINI in the last 168 hours. No results for input(s): PROBNP in the last 8760  hours. Coagulation Profile: No results for input(s): INR, PROTIME in the last 168 hours. Thyroid Function Tests: No results for input(s): TSH, T4TOTAL, FREET4, T3FREE, THYROIDAB in the last 72 hours. Lipid Profile: No results for input(s): CHOL, HDL, LDLCALC, TRIG, CHOLHDL, LDLDIRECT in the last 72 hours. Anemia Panel: No results for input(s): VITAMINB12, FOLATE, FERRITIN, TIBC, IRON, RETICCTPCT in the last 72 hours. Urine analysis:    Component Value Date/Time   COLORURINE YELLOW 03/10/2019 1054   APPEARANCEUR TURBID (A) 03/10/2019 1054   LABSPEC 1.020 03/10/2019 1054   PHURINE 5.0 03/10/2019 1054   GLUCOSEU 50 (A) 03/10/2019 1054   HGBUR SMALL (A) 03/10/2019 1054   BILIRUBINUR NEGATIVE 03/10/2019 1054   [place] 03/10/2019 1054   PROTEINUR >=300 (A) 03/10/2019 1054   NITRITE NEGATIVE 03/10/2019 1054   LEUKOCYTESUR LARGE (A) 03/10/2019 1054   Sepsis Labs: Invalid input(s): PROCALCITONIN, [place]  Microbiology: Recent Results (from the past 240 hour(s))  SARS Coronavirus 2 by RT PCR (hospital order, performed in [facility] hospital lab) Nasopharyngeal Nasopharyngeal Swab     Status: None   Collection Time: 05/14/19  5:42 PM   Specimen: Nasopharyngeal Swab  Result Value Ref Range Status   SARS Coronavirus 2 NEGATIVE NEGATIVE Final    Comment: (NOTE) If result is NEGATIVE SARS-CoV-2 target nucleic acids are NOT DETECTED. The SARS-CoV-2 RNA is generally detectable in upper and lower  respiratory specimens during the acute phase of infection. The lowest  concentration of SARS-CoV-2 viral copies this assay can detect is 250  copies / mL. A negative result does not preclude SARS-CoV-2 infection  and should not be used as the sole basis for treatment or other  patient management decisions.  A negative result may occur with  improper specimen collection / handling, submission of specimen other  than nasopharyngeal swab, presence of viral mutation(s) within the   areas targeted by this assay, and inadequate number of viral copies  (<250 copies / mL). A negative result must be combined with clinical  observations, patient history, and epidemiological information. If result is POSITIVE SARS-CoV-2 target nucleic acids are DETECTED. The SARS-CoV-2 RNA is generally detectable in upper and lower  respiratory specimens dur ing the acute phase of infection.  Positive  results are indicative of active infection  with SARS-CoV-2.  Clinical  correlation with patient history and other diagnostic information is  necessary to determine patient infection status.  Positive results do  not rule out bacterial infection or co-infection with other viruses. If result is PRESUMPTIVE POSTIVE SARS-CoV-2 nucleic acids MAY BE PRESENT.   A presumptive positive result was obtained on the submitted specimen  and confirmed on repeat testing.  While 2019 novel coronavirus  (SARS-CoV-2) nucleic acids may be present in the submitted sample  additional confirmatory testing may be necessary for epidemiological  and / or clinical management purposes  to differentiate between  SARS-CoV-2 and other Sarbecovirus currently known to infect humans.  If clinically indicated additional testing with an alternate test  methodology [phone]) is advised. The SARS-CoV-2 RNA is generally  detectable in upper and lower respiratory sp ecimens during the acute  phase of infection. The expected result is Negative. Fact Sheet for Patients:  StrictlyIdeas.no Fact Sheet for Healthcare Providers: BankingDealers.co.za This test is not yet approved or cleared by the [place] FDA and has been authorized for detection and/or diagnosis of SARS-CoV-2 by FDA under an Emergency Use Authorization (EUA).  This EUA will remain in effect (meaning this test can be used) for the duration of the COVID-19 declaration under Section 564(b)(1) of the Act, 21  U.S.C. section 360bbb-3(b)(1), unless the authorization is terminated or revoked sooner. Performed at [facility], [place] [address], [place], [place] [address]   SARS CORONAVIRUS 2 (TAT 6-24 HRS) Nasopharyngeal Nasopharyngeal Swab     Status: None   Collection Time: 05/18/19 10:48 AM   Specimen: Nasopharyngeal Swab  Result Value Ref Range Status   SARS Coronavirus 2 NEGATIVE NEGATIVE Final    Comment: (NOTE) SARS-CoV-2 target nucleic acids are NOT DETECTED. The SARS-CoV-2 RNA is generally detectable in upper and lower respiratory specimens during the acute phase of infection. Negative results do not preclude SARS-CoV-2 infection, do not rule out co-infections with other pathogens, and should not be used as the sole basis for treatment or other patient management decisions. Negative results must be combined with clinical observations, patient history, and epidemiological information. The expected result is Negative. Fact Sheet for Patients: SugarRoll.be Fact Sheet for Healthcare Providers: https://www.woods-mathews.com/ This test is not yet approved or cleared by the [place] FDA and  has been authorized for detection and/or diagnosis of SARS-CoV-2 by FDA under an Emergency Use Authorization (EUA). This EUA will remain  in effect (meaning this test can be used) for the duration of the COVID-19 declaration under Section 56 4(b)(1) of the Act, 21 U.S.C. section 360bbb-3(b)(1), unless the authorization is terminated or revoked sooner. Performed at [facility], [place] [address] [place], [place] [address]   SARS CORONAVIRUS 2 (TAT 6-24 HRS) Nasopharyngeal Nasopharyngeal Swab     Status: None   Collection Time: 05/21/19 12:59 PM   Specimen: Nasopharyngeal Swab  Result Value Ref Range Status   SARS Coronavirus 2 NEGATIVE NEGATIVE Final    Comment: (NOTE) SARS-CoV-2 target nucleic acids are NOT DETECTED. The SARS-CoV-2 RNA is  generally detectable in upper and lower respiratory specimens during the acute phase of infection. Negative results do not preclude SARS-CoV-2 infection, do not rule out co-infections with other pathogens, and should not be used as the sole basis for treatment or other patient management decisions. Negative results must be combined with clinical observations, patient history, and epidemiological information. The expected result is Negative. Fact Sheet for Patients: SugarRoll.be Fact Sheet for Healthcare Providers: https://www.woods-mathews.com/ This test  is not yet approved or cleared by the [place] and  has been authorized for detection and/or diagnosis of SARS-CoV-2 by FDA under an Emergency Use Authorization (EUA). This EUA will remain  in effect (meaning this test can be used) for the duration of the COVID-19 declaration under Section 56 4(b)(1) of the Act, 21 U.S.C. section 360bbb-3(b)(1), unless the authorization is terminated or revoked sooner. Performed at [facility], [place] [address], [place], [place] [address]     Radiology Studies: No results found.  Kathie Dike, MD [facility]  If 7PM-7AM, please contact night-coverage [email or web address]  05/22/2019, 8:27 PM

## 2019-05-23 LAB — HEMOGLOBIN AND HEMATOCRIT, BLOOD
HCT: 28 % — ABNORMAL LOW (ref 39.0–52.0)
Hemoglobin: 9 g/dL — ABNORMAL LOW (ref 13.0–17.0)

## 2019-05-23 LAB — GLUCOSE, CAPILLARY
Glucose-Capillary: 67 mg/dL — ABNORMAL LOW (ref 70–99)
Glucose-Capillary: 95 mg/dL (ref 70–99)
Glucose-Capillary: 90 mg/dL (ref 70–99)
Glucose-Capillary: 117 mg/dL — ABNORMAL HIGH (ref 70–99)
Glucose-Capillary: 93 mg/dL (ref 70–99)

## 2019-05-23 LAB — RENAL FUNCTION PANEL
Albumin: 2.1 g/dL — ABNORMAL LOW (ref 3.5–5.0)
Anion gap: 11 (ref 5–15)
BUN: 14 mg/dL (ref 6–20)
CO2: 24 mmol/L (ref 22–32)
Calcium: 8 mg/dL — ABNORMAL LOW (ref 8.9–10.3)
Chloride: 96 mmol/L — ABNORMAL LOW (ref 98–111)
Creatinine, Ser: 3.69 mg/dL — ABNORMAL HIGH (ref 0.61–1.24)
GFR calc Af Amer: 21 mL/min — ABNORMAL LOW (ref >60)
GFR calc non Af Amer: 18 mL/min — ABNORMAL LOW (ref >60)
Glucose, Bld: 74 mg/dL (ref 70–99)
Phosphorus: 3.7 mg/dL (ref 2.5–4.6)
Potassium: 3.5 mmol/L (ref 3.5–5.1)
Sodium: 131 mmol/L — ABNORMAL LOW (ref 135–145)

## 2019-05-23 LAB — GLUCOSE, RANDOM: Glucose, Bld: 74 mg/dL (ref 70–99)

## 2019-05-23 MED ORDER — CHLORHEXIDINE GLUCONATE CLOTH 2 % EX PADS
6.0000 | MEDICATED_PAD | Freq: Every day | CUTANEOUS | Status: DC
  Administered 2019-05-23 – 2019-05-24 (×2): 6 via TOPICAL

## 2019-05-23 NOTE — Progress Notes (Signed)
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   In ED, hemodynamically stable.  Hgb 9.2.  CT abdomen and pelvis revealed rectal wall thickening.  GI consulted.  Empirically started on ceftriaxone and Flagyl for possible proctitis.  Gastroenterology and nephrology consulted.  Patient underwent flex sigmoidoscopy on the evening of 10/26, was found to have a bleeding rectal ulcer, hemostasis was obtained with epinephrine injections and clips were placed.  Hemoglobin dropped to 6.5.  He received a total of 4 units throughout his hospitalization.  He also received IV iron.  Finally, hemoglobin remained stable.  GI emphasized the importance of avoiding constipation.  GI also recommended colonoscopy outpatient.   Patient is a stable for discharge.  Waiting on SNF bed and insurance authorization.   Subjective: Patient was seen in the room, napping with CPAP.  Blood sugars have been running low.  He wakes up, denies any shortness of breath.  Objective: Vitals:   05/22/19 1959 05/22/19 2151 05/23/19 0520 05/23/19 1424  BP: (!) 156/96  (!) 154/98 (!) 153/101  Pulse: 92 90 99 93  Resp: 16 18 18 18   Temp: 97.9 F (36.6 C)  98.5 F (36.9 C) 97.6 F (36.4 C)  TempSrc: Axillary  Oral Axillary  SpO2: 94% 92% 100% 97%  Weight:      Height:        Intake/Output Summary (Last 24 hours) at 05/23/2019 2032 Last data filed at 05/23/2019 1900 Gross per 24 hour  Intake 450 ml  Output 355 ml  Net 95 ml   Filed Weights   05/19/19 1626 05/22/19 0653 05/22/19 1116  Weight: 107.9 kg 110.4 kg 106.4 kg    Examination: General exam: Alert, awake, oriented x 3 Respiratory system: Clear to  auscultation. Respiratory effort normal. Cardiovascular system:RRR. No murmurs, rubs, gallops. Gastrointestinal system: Abdomen is nondistended, soft and nontender. No organomegaly or masses felt. Normal bowel sounds heard. Central nervous system: Alert and oriented. No focal neurological deficits. Extremities: Left below the knee amputation, 1+ edema on the right lower extremity Skin: Calciphylaxis and ulcerations in lower extremities bilaterally Psychiatry: Judgement and insight appear normal. Mood & affect appropriate.       Assessment & Plan: Acute blood loss anemia due to rectal bleed from bleeding rectal ulcer superimposed on anemia of chronic disease.  Baseline Hgb 8-9. 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  Diet controlled diabetes CBG (last 3)  Recent Labs    05/23/19 0813 05/23/19 1130 05/23/19 1724  GLUCAP 95 90 117*  -He is having episodes of hypoglycemia in the mornings.  Discontinue at bedtime coverage  Chronic hypotension/essential hypertension -Continue midodrine with HD.  Hyperlipidemia -Continue home statin  Depression/neuropathic pain -Continue Cymbalta and as needed tramadol.  BPH/LUTS: still makes some urine. -Continue tamsulosin and urecholine  OSA on CPAP -Continue nightly CPAP.  GERD -Continue PPI  Prolonged QTC -Minimize QT prolonging drugs.  Calciphylaxis/lower extremity wound/stage II sacral decubitus   Pressure Injury 04/26/19 Bilateral mutilple wounds on bilateral buttock (mid and inner) (Active)  04/26/19 2319  Location:   Location Orientation:  Bilateral  Staging:   Wound Description (Comments): mutilple wounds on bilateral buttock (mid and inner)  Present on Admission: Yes     Pressure Injury 04/26/19 Left Stage II -  Partial thickness loss of dermis  presenting as a shallow open ulcer with a red, pink wound bed without slough. (Active)  04/26/19 2321  Location:   Location Orientation: Left  Staging: Stage II -  Partial thickness loss of dermis presenting as a shallow open ulcer with a red, pink wound bed without slough.  Wound Description (Comments):   Present on Admission: Yes     DVT prophylaxis: None due to GI bleed and BKA and lower extremity ulceration Code Status: Full code Family Communication: Discussed plan with patient Disposition Plan: Back to SNF 11/4 after insurance authorization  Consultants: Nephrology, GI  Procedures:  10/26-Flex sigmoidoscopy with epinephrine injection and placement of clips for hemostasis  Microbiology summarized: COVID-19 negative.  Sch Meds:  Scheduled Meds: . sodium chloride   Intravenous Once  . bethanechol  25 mg Oral TID  . Chlorhexidine Gluconate Cloth  6 each Topical Q0600  . cholestyramine  8 g Oral BID  . [START ON 05/29/2019] darbepoetin (ARANESP) injection - DIALYSIS  100 mcg Intravenous Q Mon-HD  . DULoxetine  40 mg Oral Daily  . Gerhardt's butt cream   Topical BID  . insulin aspart  0-9 Units Subcutaneous TID WC  . Melatonin  4.5 mg Oral QHS  . midodrine  10 mg Oral Q M,W,F  . pantoprazole  40 mg Oral Daily  . tamsulosin  0.4 mg Oral QPC breakfast   Continuous Infusions: . ferric gluconate (FERRLECIT/NULECIT) IV Stopped (05/22/19 1201)  . sodium thiosulfate infusion for calciphylaxis 25 g (05/19/19 1521)   PRN Meds:.albuterol, ondansetron (ZOFRAN) IV, oxyCODONE, traMADol  Antimicrobials: Anti-infectives (From admission, onward)   Start     Dose/Rate Route Frequency Ordered Stop   05/15/19 1000  cefTRIAXone (ROCEPHIN) 2 g in sodium chloride 0.9 % 100 mL IVPB  Status:  Discontinued     2 g 200 mL/hr over 30 Minutes Intravenous Daily 05/14/19 2206 05/16/19 1100   05/15/19 0230  piperacillin-tazobactam (ZOSYN) IVPB 2.25 g  Status:  Discontinued     2.25 g 100 mL/hr over  30 Minutes Intravenous Every 8 hours 05/14/19 1815 05/14/19 2206   05/14/19 2215  metroNIDAZOLE (FLAGYL) IVPB 500 mg  Status:  Discontinued     500 mg 100 mL/hr over 60 Minutes Intravenous Every 8 hours 05/14/19 2206 05/16/19 1100   05/14/19 1830  piperacillin-tazobactam (ZOSYN) IVPB 3.375 g     3.375 g 100 mL/hr over 30 Minutes Intravenous  Once 05/14/19 1815 05/14/19 2000       I have personally reviewed the following labs and images: CBC: Recent Labs  Lab 05/20/19 0540 05/21/19 0757 05/22/19 0406 05/22/19 0718 05/23/19 0259  WBC  --   --   --  5.9  --   HGB 8.8* 8.8* 8.5* 8.5* 9.0*  HCT 27.2* 27.2* 26.5* 27.1* 28.0*  MCV  --   --   --  91.2  --   PLT  --   --   --  323  --    BMP &GFR Recent Labs  Lab 05/18/19 0414 05/19/19 0623 05/20/19 0540 05/21/19 0757 05/22/19 0406 05/23/19 0259  NA 136 134* 137 134* 134* 131*  K 3.6 4.1 3.5 4.1 4.2 3.5  CL 97* 96* 97* 97* 98 96*  CO2 24 22 21* 19* 22 24  GLUCOSE  72  70 68*  66* 75  75 75 80 74  74  BUN 13 21* 15 21* 26* 14  CREATININE 3.32* 4.42* 3.35* 4.42* 5.25* 3.69*  CALCIUM 7.5* 8.5* 8.1* 8.6* 8.5* 8.0*  MG 1.8  --   --   --   --   --   PHOS 3.0 4.8* 4.0 5.7* 6.0* 3.7   Estimated Creatinine Clearance: 30.2 mL/min (A) (by C-G formula based on SCr of 3.69 mg/dL (H)). Liver & Pancreas: Recent Labs  Lab 05/19/19 0623 05/20/19 0540 05/21/19 0757 05/22/19 0406 05/23/19 0259  ALBUMIN 2.1* 2.0* 2.1* 2.1* 2.1*   No results for input(s): LIPASE, AMYLASE in the last 168 hours. No results for input(s): AMMONIA in the last 168 hours. Diabetic: No results for input(s): HGBA1C in the last 72 hours. Recent Labs  Lab 05/22/19 2215 05/23/19 0737 05/23/19 0813 05/23/19 1130 05/23/19 1724  GLUCAP 93 67* 95 90 117*   Cardiac Enzymes: No results for input(s): CKTOTAL, CKMB, CKMBINDEX, TROPONINI in the last 168 hours. No results for input(s): PROBNP in the last 8760 hours. Coagulation Profile: No results for  input(s): INR, PROTIME in the last 168 hours. Thyroid Function Tests: No results for input(s): TSH, T4TOTAL, FREET4, T3FREE, THYROIDAB in the last 72 hours. Lipid Profile: No results for input(s): CHOL, HDL, LDLCALC, TRIG, CHOLHDL, LDLDIRECT in the last 72 hours. Anemia Panel: No results for input(s): VITAMINB12, FOLATE, FERRITIN, TIBC, IRON, RETICCTPCT in the last 72 hours. Urine analysis:    Component Value Date/Time   COLORURINE YELLOW 03/10/2019 1054   APPEARANCEUR TURBID (A) 03/10/2019 1054   LABSPEC 1.020 03/10/2019 1054   PHURINE 5.0 03/10/2019 1054   GLUCOSEU 50 (A) 03/10/2019 1054   HGBUR SMALL (A) 03/10/2019 1054   BILIRUBINUR NEGATIVE 03/10/2019 1054   [place] 03/10/2019 1054   PROTEINUR >=300 (A) 03/10/2019 1054   NITRITE NEGATIVE 03/10/2019 1054   LEUKOCYTESUR LARGE (A) 03/10/2019 1054   Sepsis Labs: Invalid input(s): PROCALCITONIN, [place]  Microbiology: Recent Results (from the past 240 hour(s))  SARS Coronavirus 2 by RT PCR (hospital order, performed in [facility] hospital lab) Nasopharyngeal Nasopharyngeal Swab     Status: None   Collection Time: 05/14/19  5:42 PM   Specimen: Nasopharyngeal Swab  Result Value Ref Range Status   SARS Coronavirus 2 NEGATIVE NEGATIVE Final    Comment: (NOTE) If result is NEGATIVE SARS-CoV-2 target nucleic acids are NOT DETECTED. The SARS-CoV-2 RNA is generally detectable in upper and lower  respiratory specimens during the acute phase of infection. The lowest  concentration of SARS-CoV-2 viral copies this assay can detect is 250  copies / mL. A negative result does not preclude SARS-CoV-2 infection  and should not be used as the sole basis for treatment or other  patient management decisions.  A negative result may occur with  improper specimen collection / handling, submission of specimen other  than nasopharyngeal swab, presence of viral mutation(s) within the  areas targeted by this assay, and inadequate  number of viral copies  (<250 copies / mL). A negative result must be combined with clinical  observations, patient history, and epidemiological information. If result is POSITIVE SARS-CoV-2 target nucleic acids are DETECTED. The SARS-CoV-2 RNA is generally detectable in upper and lower  respiratory specimens dur ing the acute phase of infection.  Positive  results are indicative of active infection with SARS-CoV-2.  Clinical  correlation with patient history and other diagnostic information is  necessary to determine patient infection status.  Positive results do  not rule out bacterial infection or co-infection with other viruses. If result is PRESUMPTIVE POSTIVE SARS-CoV-2 nucleic acids MAY BE PRESENT.   A presumptive positive result was obtained on the submitted specimen  and confirmed on repeat testing.  While 2019 novel coronavirus  (SARS-CoV-2) nucleic acids may be present in the submitted sample  additional confirmatory testing may be necessary for epidemiological  and / or clinical management purposes  to differentiate between  SARS-CoV-2 and other Sarbecovirus currently known to infect humans.  If clinically indicated additional testing with an alternate test  methodology [phone]) is advised. The SARS-CoV-2 RNA is generally  detectable in upper and lower respiratory sp ecimens during the acute  phase of infection. The expected result is Negative. Fact Sheet for Patients:  StrictlyIdeas.no Fact Sheet for Healthcare Providers: BankingDealers.co.za This test is not yet approved or cleared by the [place] FDA and has been authorized for detection and/or diagnosis of SARS-CoV-2 by FDA under an Emergency Use Authorization (EUA).  This EUA will remain in effect (meaning this test can be used) for the duration of the COVID-19 declaration under Section 564(b)(1) of the Act, 21 U.S.C. section 360bbb-3(b)(1), unless the  authorization is terminated or revoked sooner. Performed at [facility], [place] [address], [place], [place] [address]   SARS CORONAVIRUS 2 (TAT 6-24 HRS) Nasopharyngeal Nasopharyngeal Swab     Status: None   Collection Time: 05/18/19 10:48 AM   Specimen: Nasopharyngeal Swab  Result Value Ref Range Status   SARS Coronavirus 2 NEGATIVE NEGATIVE Final    Comment: (NOTE) SARS-CoV-2 target nucleic acids are NOT DETECTED. The SARS-CoV-2 RNA is generally detectable in upper and lower respiratory specimens during the acute phase of infection. Negative results do not preclude SARS-CoV-2 infection, do not rule out co-infections with other pathogens, and should not be used as the sole basis for treatment or other patient management decisions. Negative results must be combined with clinical observations, patient history, and epidemiological information. The expected result is Negative. Fact Sheet for Patients: SugarRoll.be Fact Sheet for Healthcare Providers: https://www.woods-mathews.com/ This test is not yet approved or cleared by the [place] FDA and  has been authorized for detection and/or diagnosis of SARS-CoV-2 by FDA under an Emergency Use Authorization (EUA). This EUA will remain  in effect (meaning this test can be used) for the duration of the COVID-19 declaration under Section 56 4(b)(1) of the Act, 21 U.S.C. section 360bbb-3(b)(1), unless the authorization is terminated or revoked sooner. Performed at [facility], [place] [address], [place], [place] [address]   SARS CORONAVIRUS 2 (TAT 6-24 HRS) Nasopharyngeal Nasopharyngeal Swab     Status: None   Collection Time: 05/21/19 12:59 PM   Specimen: Nasopharyngeal Swab  Result Value Ref Range Status   SARS Coronavirus 2 NEGATIVE NEGATIVE Final    Comment: (NOTE) SARS-CoV-2 target nucleic acids are NOT DETECTED. The SARS-CoV-2 RNA is generally detectable in upper and  lower respiratory specimens during the acute phase of infection. Negative results do not preclude SARS-CoV-2 infection, do not rule out co-infections with other pathogens, and should not be used as the sole basis for treatment or other patient management decisions. Negative results must be combined with clinical observations, patient history, and epidemiological information. The expected result is Negative. Fact Sheet for Patients: SugarRoll.be Fact Sheet for Healthcare Providers: https://www.woods-mathews.com/ This test is not yet approved or cleared by the [place] FDA and  has been authorized for detection and/or diagnosis of SARS-CoV-2  by FDA under an Emergency Use Authorization (EUA). This EUA will remain  in effect (meaning this test can be used) for the duration of the COVID-19 declaration under Section 56 4(b)(1) of the Act, 21 U.S.C. section 360bbb-3(b)(1), unless the authorization is terminated or revoked sooner. Performed at [facility], [place] [address] [place], [place] [address]     Radiology Studies: No results found.  Kathie Dike, MD [facility]  If 7PM-7AM, please contact night-coverage [email or web address]  05/23/2019, 8:32 PM

## 2019-05-23 NOTE — Plan of Care (Signed)
  Problem: Education: Goal: Knowledge of General Education information will improve Description: Including pain rating scale, medication(s)/side effects and non-pharmacologic comfort measures Outcome: Progressing   Problem: Health Behavior/Discharge Planning: Goal: Ability to manage health-related needs will improve Outcome: Progressing   Problem: Clinical Measurements: Goal: Ability to maintain clinical measurements within normal limits will improve Outcome: Progressing Goal: Will remain free from infection Outcome: Progressing Goal: Diagnostic test results will improve Outcome: Progressing Goal: Respiratory complications will improve Outcome: Progressing Goal: Cardiovascular complication will be avoided Outcome: Progressing   Problem: Coping: Goal: Level of anxiety will decrease Outcome: Progressing   Problem: Pain Managment: Goal: General experience of comfort will improve Outcome: Progressing   Problem: Safety: Goal: Ability to remain free from injury will improve Outcome: Progressing   Problem: Skin Integrity: Goal: Risk for impaired skin integrity will decrease Outcome: Progressing   Problem: Education: Goal: Ability to identify signs and symptoms of gastrointestinal bleeding will improve Outcome: Progressing   Problem: Bowel/Gastric: Goal: Will show no signs and symptoms of gastrointestinal bleeding Outcome: Progressing   Problem: Fluid Volume: Goal: Will show no signs and symptoms of excessive bleeding Outcome: Progressing   Problem: Clinical Measurements: Goal: Complications related to the disease process, condition or treatment will be avoided or minimized Outcome: Progressing   Problem: Education: Goal: Knowledge of General Education information will improve Description: Including pain rating scale, medication(s)/side effects and non-pharmacologic comfort measures Outcome: Progressing   Problem: Health Behavior/Discharge Planning: Goal: Ability to  manage health-related needs will improve Outcome: Progressing   Problem: Clinical Measurements: Goal: Ability to maintain clinical measurements within normal limits will improve Outcome: Progressing Goal: Will remain free from infection Outcome: Progressing Goal: Diagnostic test results will improve Outcome: Progressing Goal: Respiratory complications will improve Outcome: Progressing Goal: Cardiovascular complication will be avoided Outcome: Progressing   Problem: Activity: Goal: Risk for activity intolerance will decrease Outcome: Progressing   Problem: Nutrition: Goal: Adequate nutrition will be maintained Outcome: Progressing   Problem: Coping: Goal: Level of anxiety will decrease Outcome: Progressing   Problem: Elimination: Goal: Will not experience complications related to bowel motility Outcome: Progressing Goal: Will not experience complications related to urinary retention Outcome: Progressing   Problem: Pain Managment: Goal: General experience of comfort will improve Outcome: Progressing   Problem: Safety: Goal: Ability to remain free from injury will improve Outcome: Progressing   Problem: Skin Integrity: Goal: Risk for impaired skin integrity will decrease Outcome: Progressing

## 2019-05-23 NOTE — Progress Notes (Addendum)
Michael Riggs   Subjective:   Patient seen and examined at bedside.  Reports Michael Riggs felt like his kidney's were "fluttering" during dialysis yesterday.  Denies CP, palpitations, n/v/d/c, abdominal pain, fever and chills. Admits to SOB w/exertion and orthopnea, improved with CPAP. No bleeding.   Objective Vitals:   05/22/19 1632 05/22/19 1959 05/22/19 2151 05/23/19 0520  BP: (!) 150/99 (!) 156/96  (!) 154/98  Pulse: 93 92 90 99  Resp: 16 16 18 18   Temp: 97.6 F (36.4 C) 97.9 F (36.6 C)  98.5 F (36.9 C)  TempSrc: Oral Axillary  Oral  SpO2: 96% 94% 92% 100%  Weight:      Height:       Physical Exam General:pale, chronically ill appearing male in NAD, sitting on side of bed Heart:RRR, no mrg Lungs:mostly CTAB, BS decreased in bases Abdomen:soft, NTND, +edema in abd wall  Extremities:1+ edema in RLE - calf dressed - c/d/i, b/l hip edema, L BKA no stump edema, multiple calciphylaxis wounds on b/l thighs Dialysis Access: R IJ TDC, LU AVF maturing +b   Filed Weights   05/19/19 1626 05/22/19 0653 05/22/19 1116  Weight: 107.9 kg 110.4 kg 106.4 kg    Intake/Output Summary (Last 24 hours) at 05/23/2019 1104 Last data filed at 05/23/2019 0944 Gross per 24 hour  Intake 1210 ml  Output 75 ml  Net 1135 ml    Additional Objective Labs: Basic Metabolic Panel: Recent Labs  Lab 05/21/19 0757 05/22/19 0406 05/23/19 0259  NA 134* 134* 131*  K 4.1 4.2 3.5  CL 97* 98 96*  CO2 19* 22 24  GLUCOSE 75 80 74  74  BUN 21* 26* 14  CREATININE 4.42* 5.25* 3.69*  CALCIUM 8.6* 8.5* 8.0*  PHOS 5.7* 6.0* 3.7   Liver Function Tests: Recent Labs  Lab 05/21/19 0757 05/22/19 0406 05/23/19 0259  ALBUMIN 2.1* 2.1* 2.1*   CBC: Recent Labs  Lab 05/22/19 0406 05/22/19 0718 05/23/19 0259  WBC  --  5.9  --   HGB 8.5* 8.5* 9.0*  HCT 26.5* 27.1* 28.0*  MCV  --  91.2  --   PLT  --  323  --    Blood Culture    Component Value Date/Time   SDES TRACHEAL ASPIRATE  04/11/2019 1020   SPECREQUEST NONE 04/11/2019 1020   CULT  04/11/2019 1020    RARE Consistent with normal respiratory flora. Performed at [facility], [place] [address], [place], [place] [address]    REPTSTATUS 04/14/2019 FINAL 04/11/2019 1020    CBG: Recent Labs  Lab 05/22/19 1325 05/22/19 1757 05/22/19 2215 05/23/19 0737 05/23/19 0813  GLUCAP 106* 152* 93 67* 95   Studies/Results: No results found.  Medications: . ferric gluconate (FERRLECIT/NULECIT) IV Stopped (05/22/19 1201)  . sodium thiosulfate infusion for calciphylaxis 25 g (05/19/19 1521)   . sodium chloride   Intravenous Once  . bethanechol  25 mg Oral TID  . Chlorhexidine Gluconate Cloth  6 each Topical Q0600  . Chlorhexidine Gluconate Cloth  6 each Topical Q0600  . cholestyramine  8 g Oral BID  . [START ON 05/29/2019] darbepoetin (ARANESP) injection - DIALYSIS  100 mcg Intravenous Q Mon-HD  . DULoxetine  40 mg Oral Daily  . Gerhardt's butt cream   Topical BID  . insulin aspart  0-9 Units Subcutaneous TID WC  . Melatonin  4.5 mg Oral QHS  . midodrine  10 mg Oral Q M,W,F  . pantoprazole  40 mg Oral  Daily  . tamsulosin  0.4 mg Oral QPC breakfast    Dialysis Orders: MWFS- [facility] 4h  107kg  2/2 bath Hep 3000  RIJ TDC/ LUA AVF maturing (be sure to change to 2K/2Ca bath at OP unit, needs Ca lowering for calciphylaxis)   Mircera139mcg q2wks - last 10/16 Venofer 100mg  x10 - 2 of 10 completed Sodium thiosulfate 25mg  qHD  Assessment/Plan: 1. Hematochezia - Flex sig done 10/26 showed blood in rectum/ sigmoid and solitary bleeding rectal ulcer s/p inject/clips per GI. Felt to be stercoral ulcer from constipation. S/p 4units pRBC during stay. Hb stable 8-9. Awaiting return to SNF. 2. ESRD - On HD MWFS. HD yesterday per regular schedule. Using [facility], has L AVF maturing.   3. Hypokalemia - Improved. K 3.5 today. Use po KCL for low K+ to continue using 2/2 bath for #7.  4. HTN/ volume - BP's high lately and has  sig dependent edema on exam, at dry wt but has prob lost sig body wt > plan ^UF this week and cont pre HD midodrine for now. Will need lower dry on d/c. 5. Anemia of CKD - Hgb 9.0 today. Continue iron load. Given aranesp 137mcg SQ on 11/1, continue IV weekly.  6. Secondary Hyperparathyroidism - Ca at goal. Phos improved today. NO calcium/Vit D because of calciphylaxis.  7. Nutrition - Renal diet w/fluid restriction when no longer NPO 8. Calciphylaxis - severe case bilat LE's, recent issue, appears to be grossly improving. Cont Na thiosulfate, low Ca 2.0 bath on HD (important), and avoid all Ca/ vit D products. Not on binders. 9. DM - per primary 10. Dispo - awaiting authorization to return to SNF, repeat COVID neg 10/29   Michael Riggs, Michael Riggs Pager: [phone] 05/23/2019,11:04 AM  LOS: 9 days   I have seen and examined this patient and agree with plan and assessment in the above Riggs with renal recommendations/intervention highlighted.  Resting comfortably.  Continue with HD on his regular schedule.  Cont to challenge edw as bp tolerates given anasarca. Michael Rooks Zamariya Neal,MD 05/23/2019 3:27 PM

## 2019-05-23 NOTE — Progress Notes (Signed)
Patient is able to self-administer CPAP and is familiar with equipment and procedure.

## 2019-05-24 ENCOUNTER — Telehealth: Payer: Self-pay | Admitting: *Deleted

## 2019-05-24 ENCOUNTER — Encounter: Payer: Self-pay | Admitting: *Deleted

## 2019-05-24 DIAGNOSIS — E119 Type 2 diabetes mellitus without complications: Secondary | ICD-10-CM | POA: Diagnosis not present

## 2019-05-24 DIAGNOSIS — R269 Unspecified abnormalities of gait and mobility: Secondary | ICD-10-CM | POA: Diagnosis not present

## 2019-05-24 DIAGNOSIS — R339 Retention of urine, unspecified: Secondary | ICD-10-CM | POA: Diagnosis not present

## 2019-05-24 DIAGNOSIS — G4730 Sleep apnea, unspecified: Secondary | ICD-10-CM | POA: Diagnosis not present

## 2019-05-24 DIAGNOSIS — E1121 Type 2 diabetes mellitus with diabetic nephropathy: Secondary | ICD-10-CM | POA: Diagnosis not present

## 2019-05-24 DIAGNOSIS — D62 Acute posthemorrhagic anemia: Secondary | ICD-10-CM | POA: Diagnosis not present

## 2019-05-24 DIAGNOSIS — R279 Unspecified lack of coordination: Secondary | ICD-10-CM | POA: Diagnosis not present

## 2019-05-24 DIAGNOSIS — M6281 Muscle weakness (generalized): Secondary | ICD-10-CM | POA: Diagnosis not present

## 2019-05-24 DIAGNOSIS — D509 Iron deficiency anemia, unspecified: Secondary | ICD-10-CM | POA: Diagnosis not present

## 2019-05-24 DIAGNOSIS — N186 End stage renal disease: Secondary | ICD-10-CM | POA: Diagnosis not present

## 2019-05-24 DIAGNOSIS — G8929 Other chronic pain: Secondary | ICD-10-CM | POA: Diagnosis not present

## 2019-05-24 DIAGNOSIS — Z23 Encounter for immunization: Secondary | ICD-10-CM | POA: Diagnosis not present

## 2019-05-24 DIAGNOSIS — E875 Hyperkalemia: Secondary | ICD-10-CM | POA: Diagnosis not present

## 2019-05-24 DIAGNOSIS — R0602 Shortness of breath: Secondary | ICD-10-CM | POA: Diagnosis not present

## 2019-05-24 DIAGNOSIS — R5381 Other malaise: Secondary | ICD-10-CM | POA: Diagnosis not present

## 2019-05-24 DIAGNOSIS — D638 Anemia in other chronic diseases classified elsewhere: Secondary | ICD-10-CM | POA: Diagnosis not present

## 2019-05-24 DIAGNOSIS — G4700 Insomnia, unspecified: Secondary | ICD-10-CM | POA: Diagnosis not present

## 2019-05-24 DIAGNOSIS — I120 Hypertensive chronic kidney disease with stage 5 chronic kidney disease or end stage renal disease: Secondary | ICD-10-CM | POA: Diagnosis not present

## 2019-05-24 DIAGNOSIS — R29898 Other symptoms and signs involving the musculoskeletal system: Secondary | ICD-10-CM | POA: Diagnosis not present

## 2019-05-24 DIAGNOSIS — E1165 Type 2 diabetes mellitus with hyperglycemia: Secondary | ICD-10-CM | POA: Diagnosis not present

## 2019-05-24 DIAGNOSIS — Z743 Need for continuous supervision: Secondary | ICD-10-CM | POA: Diagnosis not present

## 2019-05-24 DIAGNOSIS — K922 Gastrointestinal hemorrhage, unspecified: Secondary | ICD-10-CM | POA: Diagnosis not present

## 2019-05-24 DIAGNOSIS — R262 Difficulty in walking, not elsewhere classified: Secondary | ICD-10-CM | POA: Diagnosis not present

## 2019-05-24 DIAGNOSIS — R531 Weakness: Secondary | ICD-10-CM | POA: Diagnosis not present

## 2019-05-24 DIAGNOSIS — D689 Coagulation defect, unspecified: Secondary | ICD-10-CM | POA: Diagnosis not present

## 2019-05-24 DIAGNOSIS — Z741 Need for assistance with personal care: Secondary | ICD-10-CM | POA: Diagnosis not present

## 2019-05-24 DIAGNOSIS — Z992 Dependence on renal dialysis: Secondary | ICD-10-CM | POA: Diagnosis not present

## 2019-05-24 DIAGNOSIS — D649 Anemia, unspecified: Secondary | ICD-10-CM | POA: Diagnosis not present

## 2019-05-24 DIAGNOSIS — L299 Pruritus, unspecified: Secondary | ICD-10-CM | POA: Diagnosis not present

## 2019-05-24 DIAGNOSIS — E1151 Type 2 diabetes mellitus with diabetic peripheral angiopathy without gangrene: Secondary | ICD-10-CM | POA: Diagnosis not present

## 2019-05-24 DIAGNOSIS — D631 Anemia in chronic kidney disease: Secondary | ICD-10-CM | POA: Diagnosis not present

## 2019-05-24 DIAGNOSIS — K219 Gastro-esophageal reflux disease without esophagitis: Secondary | ICD-10-CM | POA: Diagnosis not present

## 2019-05-24 DIAGNOSIS — Z89512 Acquired absence of left leg below knee: Secondary | ICD-10-CM | POA: Diagnosis not present

## 2019-05-24 DIAGNOSIS — I10 Essential (primary) hypertension: Secondary | ICD-10-CM | POA: Diagnosis not present

## 2019-05-24 DIAGNOSIS — K5900 Constipation, unspecified: Secondary | ICD-10-CM | POA: Diagnosis not present

## 2019-05-24 DIAGNOSIS — R234 Changes in skin texture: Secondary | ICD-10-CM | POA: Diagnosis not present

## 2019-05-24 DIAGNOSIS — E785 Hyperlipidemia, unspecified: Secondary | ICD-10-CM | POA: Diagnosis not present

## 2019-05-24 DIAGNOSIS — N2581 Secondary hyperparathyroidism of renal origin: Secondary | ICD-10-CM | POA: Diagnosis not present

## 2019-05-24 DIAGNOSIS — R278 Other lack of coordination: Secondary | ICD-10-CM | POA: Diagnosis not present

## 2019-05-24 DIAGNOSIS — E1122 Type 2 diabetes mellitus with diabetic chronic kidney disease: Secondary | ICD-10-CM | POA: Diagnosis not present

## 2019-05-24 LAB — GLUCOSE, RANDOM: Glucose, Bld: 79 mg/dL (ref 70–99)

## 2019-05-24 LAB — GLUCOSE, CAPILLARY: Glucose-Capillary: 78 mg/dL (ref 70–99)

## 2019-05-24 NOTE — Progress Notes (Signed)
Discharged pt to [place] at [facility] via [facility] following patient's scheduled dialysis. Report given to receiving RN at the facility. Discharge Instructions given and explained.

## 2019-05-24 NOTE — Progress Notes (Signed)
Report given to receiving RN at [facility].

## 2019-05-24 NOTE — Progress Notes (Addendum)
[facility] Progress Note   Subjective:   Patient seen and examined at bedside. Reports he is "in prison," does not like the food here. Plan to discharge today with HD at outpatient unit. Reports some diaphoresis last night, now resolved. No leukocytosis/fever, not associated with CP. Reports stable SOB. Denies cough, abdominal pain, N/V/D and edema.   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 Dialysis Access: R IJ TDC without erythema/drainage, LU AVF maturing +bruit  Additional Objective Labs: Basic Metabolic Panel: Recent Labs  Lab 05/21/19 0757 05/22/19 0406 05/23/19 0259 05/24/19 0211  NA 134* 134* 131*  --   K 4.1 4.2 3.5  --   CL 97* 98 96*  --   CO2 19* 22 24  --   GLUCOSE 75 80 74  74 79  BUN 21* 26* 14  --   CREATININE 4.42* 5.25* 3.69*  --   CALCIUM 8.6* 8.5* 8.0*  --   PHOS 5.7* 6.0* 3.7  --    Liver Function Tests: Recent Labs  Lab 05/21/19 0757 05/22/19 0406 05/23/19 0259  ALBUMIN 2.1* 2.1* 2.1*   CBC: Recent Labs  Lab 05/22/19 0406 05/22/19 0718 05/23/19 0259  WBC  --  5.9  --   HGB 8.5* 8.5* 9.0*  HCT 26.5* 27.1* 28.0*  MCV  --  91.2  --   PLT  --  323  --    Blood Culture    Component Value Date/Time   SDES TRACHEAL ASPIRATE 04/11/2019 1020   SPECREQUEST NONE 04/11/2019 1020   CULT  04/11/2019 1020    RARE Consistent with normal respiratory flora. Performed at [facility], [place] [address] [place],  [address]    REPTSTATUS 04/14/2019 FINAL 04/11/2019 1020   CBG: Recent Labs  Lab 05/23/19 0813 05/23/19 1130 05/23/19 1724 05/23/19 2133 05/24/19 0728  GLUCAP 95 90 117* 93 78   Medications: . ferric gluconate (FERRLECIT/NULECIT) IV Stopped (05/22/19 1201)  . sodium thiosulfate infusion for calciphylaxis 25 g (05/19/19 1521)   . sodium chloride   Intravenous Once  . bethanechol  25 mg Oral TID  . Chlorhexidine Gluconate Cloth  6 each Topical Q0600  . cholestyramine  8 g Oral BID  . [START ON 05/29/2019] darbepoetin (ARANESP) injection - DIALYSIS  100 mcg Intravenous Q Mon-HD  . DULoxetine  40 mg Oral Daily  . Gerhardt's butt cream   Topical BID  . insulin aspart  0-9 Units Subcutaneous TID WC  . Melatonin  4.5 mg Oral QHS  . midodrine  10 mg Oral Q M,W,F  . pantoprazole  40 mg Oral Daily  . tamsulosin  0.4 mg Oral QPC breakfast    Dialysis Orders: [facility]- [facility] 4h 107kg 2/2 bath Hep 3000 RIJ TDC/ LUA AVF maturing (be sure to change to 2K/2Ca bath at OP unit, needs Ca lowering for calciphylaxis)  Mircera137mcg q2wks - last 10/16 Venofer 100mg  x10 - 2 of 10 completed Sodium thiosulfate 25mg  qHD  Assessment/Plan: 1. Hematochezia - Flex sig done 10/26 showed blood in rectum/ sigmoid and solitary bleeding rectal ulcer s/p inject/clips per GI. Felt to be stercoral ulcer from constipation. S/p 4units pRBC during stay. Hb stable 8-9.  2. ESRD - On HD [facility]FS.HD yesterday per regular schedule. Using [facility], has L AVF maturing.  3. Hypokalemia - Improved. K 3.5 today. Use po KCL for low K+ to continue using 2/2 bath for #7.  4. HTN/ volume - BP's high lately and has sig dependent edema on exam, at dry wt but has prob lost sig body wt > plan ^UF, will lower dry weight to continue to challenge EDW at outpatient unit. Continue midodrine pre-HD. 5. Anemia of CKD - Hgb 9.0 today. Continue iron load.Given aranesp 189mcg SQ on 11/1, will  change to mircera at discharge.  6. Secondary Hyperparathyroidism - Ca at goal. Phos improved today. NO calcium/Vit D because of calciphylaxis.  7. Nutrition - Renal diet w/fluid restriction 8. Calciphylaxis -severe case bilat LE's,recent issue, appears to be grossly improving. Cont Na thiosulfate, low Ca 2.0 bath on HD (important), and avoid all Ca/ vit D products. Not on binders. 9. DM - per primary 10. Dispo - discharging to SNF today  Anice Paganini, PA-C 05/24/2019, 10:13 AM  [facility] Pager: [phone]  I have seen and examined this patient and agree with plan and assessment in the above note with renal recommendations/intervention highlighted.  Will need to get back on schedule at outpatient HD center after discharge to SNF. [place] John A Trenna Kiely,MD 05/24/2019 12:11 PM

## 2019-05-24 NOTE — Discharge Summary (Signed)
Physician Discharge Summary  Michael Riggs VFI:[id number] DOB: [birth year] DOA: 05/14/2019  PCP: [name], MD  Admit date: 05/14/2019 Discharge date: 05/24/2019  Admitted From: SNF Disposition:  SNF  Recommendations for Outpatient Follow-up:  1. Follow up with PCP in 1-2 weeks 2. Please obtain BMP/CBC in one week 3. Please follow up on the following pending results:  Home Health: NO  Equipment/Devices: NONE  Discharge Condition: Stable CODE STATUS: FULL Diet recommendation: Heart Healthy, RENAL.  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   In ED, hemodynamically stable.  Hgb 9.2.  CT abdomen and pelvis revealed rectal wall thickening.  GI consulted.  Empirically started on ceftriaxone and Flagyl for possible proctitis.  Gastroenterology and nephrology consulted.  Patient underwent flex sigmoidoscopy on the evening of 10/26, was found to have a bleeding rectal ulcer, hemostasis was obtained with epinephrine injections and clips were placed.  Hemoglobin dropped to 6.5.  He received a total of 4 units throughout his hospitalization.  He also received IV iron.  Finally, hemoglobin remained stable.  GI emphasized the importance of avoiding constipation.  GI also recommended colonoscopy outpatient.   Patient is a stable for discharge.  Waiting on SNF bed and insurance authorization.  Discharge Diagnoses:  Principal Problem:   Lower GI bleed Active Problems:   Diabetes mellitus type 2 in obese (HCC)   End stage renal disease ([place])   Controlled type 2 diabetes mellitus with hyperglycemia, without long-term current use of insulin (HCC)   Hypertension   Calciphylaxis   Pressure injury of skin  Acute blood loss anemia due to rectal bleed from bleeding rectal ulcer superimposed on anemia of chronic disease.   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 Per nephrology.  Patient is being planned for outpatient dialysis today and return to skilled nursing facility.  Diet controlled diabetes: Blood sugar fairly controlled.He was having episodes of hypoglycemia in the mornings.  sp discontinued at bedtime coverage  Chronic hypotension/essential hypertension:Continue midodrine with HD.  Hyperlipidemia:Continue home statin  Depression/neuropathic pain:Continue Cymbalta and as needed tramadol.  BPH/LUTS: still makes some urine.Continue tamsulosin and urecholine  OSA on CPAP:Continue nightly CPAP.  GERD:Continue PPI  Prolonged CZY:SAYTKZSW QT prolonging drugs.  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 (Active)  04/26/19 2321  Location:   Location Orientation: Left  Staging: Stage II -  Partial thickness loss of dermis presenting as a shallow open ulcer with a red, pink wound bed without slough.  Wound Description (Comments):   Present on Admission: Yes     DVT prophylaxis: None due to GI bleed and BKA and lower extremity ulceration Code  Status: Full code Family Communication: Discussed plan  with patient Disposition Plan: Back to SNF 11/4 as  insurance authorization obtained per CM  Consultants: Nephrology, GI  Procedures:  10/26-Flex sigmoidoscopy with epinephrine injection and placement of clips for hemostasis  Microbiology summarized: COVID-19 negative.   Discharge Instructions  Discharge Instructions    Diet - low sodium heart healthy   Complete by: As directed    Diet Carb Modified   Complete by: As directed    Discharge instructions   Complete by: As directed    Please call call MD or return to ER for similar or worsening recurring problem that brought you to hospital or if any fever,nausea/vomiting,abdominal pain, uncontrolled pain, chest pain,  shortness of breath or any other alarming symptoms.  Please follow-up your doctor as instructed in a week time and call the office for appointment.  Please avoid alcohol, smoking, or any other illicit substance and maintain healthy habits including taking your regular medications as prescribed.  You were cared for by a hospitalist during your hospital stay. If you have any questions about your discharge medications or the care you received while you were in the hospital after you are discharged, you can call the unit and ask to speak with the hospitalist on call if the hospitalist that took care of you is not available.  Once you are discharged, your primary care physician will handle any further medical issues. Please note that NO REFILLS for any discharge medications will be authorized once you are discharged, as it is imperative that you return to your primary care physician (or establish a relationship with a primary care physician if you do not have one) for your aftercare needs so that they can reassess your need for medications and monitor your lab values   Increase activity slowly   Complete by: As directed    Increase activity slowly   Complete by: As directed      Allergies as of 05/24/2019   No Known  Allergies     Medication List    STOP taking these medications   amLODipine 5 MG tablet Commonly known as: NORVASC   Loperamide HCl 1 MG/7.5ML Liqd   oxyCODONE 5 MG immediate release tablet Commonly known as: Oxy IR/ROXICODONE     TAKE these medications   acetaminophen 325 MG tablet Commonly known as: TYLENOL Take 2 tablets (650 mg total) by mouth every 6 (six) hours as needed for mild pain (or Fever >/= 101).   albuterol 108 (90 Base) MCG/ACT inhaler Commonly known as: VENTOLIN HFA Inhale 2 puffs into the lungs every 6 (six) hours as needed for wheezing or shortness of breath.   atorvastatin 40 MG tablet Commonly known as: LIPITOR Take 1 tablet (40 mg total) by mouth daily.   b complex-vitamin c-folic acid 0.8 MG Tabs tablet Take 1 tablet by mouth daily.   bethanechol 25 MG tablet Commonly known as: URECHOLINE Take 1 tablet (25 mg total) by mouth 3 (three) times daily.   camphor-menthol lotion Commonly known as: SARNA Apply 1 application topically as needed for itching.   cholestyramine 4 g packet Commonly known as: QUESTRAN Take 2 packets (8 g total) by mouth 2 (two) times daily.   collagenase ointment Commonly known as: SANTYL Apply 1 application topically daily. Apply to the affected area daily plus dry dressing   DULoxetine HCl 40 MG Cpep Take 40 mg by mouth daily.   Gerhardt's butt cream Crea Apply 1 application topically 3 (three) times daily  as needed for irritation.   hydrocortisone 25 MG suppository Commonly known as: ANUSOL-HC Place 1 suppository (25 mg total) rectally 2 (two) times daily as needed for hemorrhoids or anal itching.   ipratropium-albuterol 0.5-2.5 (3) MG/3ML Soln Commonly known as: DUONEB Take 3 mLs by nebulization every 6 (six) hours as needed (For shortness of breath).   Lidocaine-Prilocaine &Lido HCl 2.5-2.5 & 3.88 % Kit Apply 1 application topically every Monday, Wednesday, and Friday. Apply to fistula and wrap with saran  wrap site prior to dialysys   Melatonin 3 MG Tabs Take 1.5 tablets (4.5 mg total) by mouth at bedtime.   midodrine 10 MG tablet Commonly known as: PROAMATINE Take 1 tablet (10 mg total) by mouth every Monday, Wednesday, and Friday.   nystatin powder Generic drug: nystatin Apply 1 Bottle topically 3 (three) times daily.   pantoprazole 40 MG tablet Commonly known as: PROTONIX Take 1 tablet (40 mg total) by mouth daily.   polyethylene glycol 17 g packet Commonly known as: MIRALAX / GLYCOLAX Take 17 g by mouth daily. What changed:   when to take this  reasons to take this  additional instructions   senna-docusate 8.6-50 MG tablet Commonly known as: Senokot S Take 1 tablet by mouth at bedtime as needed for mild constipation.   silver sulfADIAZINE 1 % cream Commonly known as: SILVADENE Apply 1 application topically daily.   tamsulosin 0.4 MG Caps capsule Commonly known as: FLOMAX Take 1 capsule (0.4 mg total) by mouth daily after breakfast.   traMADol 50 MG tablet Commonly known as: ULTRAM Take 1 tablet (50 mg total) by mouth every 8 (eight) hours as needed for moderate pain. What changed: when to take this       Contact information for follow-up providers    [name], MD Follow up in 1 week(s).   Specialty: Internal Medicine Contact information: [place] [address] [phone]            Contact information for after-discharge care    Destination    [facility] Preferred SNF .   Service: Skilled Nursing Contact information: [address] [place] [address] [phone]                 No Known Allergies  Procedures/Studies: Ct Abdomen Pelvis Wo Contrast  Result Date: 05/14/2019 CLINICAL DATA:  Rectal bleeding this morning. Constipation. Abdominal distension. EXAM: CT ABDOMEN AND PELVIS WITHOUT CONTRAST TECHNIQUE: Multidetector CT imaging of the abdomen and pelvis was performed  following the standard protocol without IV contrast. COMPARISON:  04/27/2019 abdominal radiograph. 02/11/2019 CT abdomen/pelvis. FINDINGS: Lower chest: Small to moderate bilateral dependent pleural effusions with associated dependent bibasilar atelectasis. Coronary atherosclerosis. Trace pericardial effusion/thickening. Hepatobiliary: Normal liver size. No liver mass. Cholelithiasis. No significant gallbladder distention. No gallbladder wall thickening. No pericholecystic fluid. No biliary ductal dilatation. Pancreas: Normal, with no mass or duct dilation. Spleen: Normal size. No mass. Adrenals/Urinary Tract: Normal adrenals. No hydronephrosis. No renal stones. No contour deforming renal masses. Nondistended bladder with questionable diffuse bladder wall thickening. Stomach/Bowel: Normal non-distended stomach. Normal caliber small bowel with no small bowel wall thickening. Normal appendix. Rectum moderately distended by stool. Mild circumferential rectal wall thickening is new with mild haziness of perirectal fat. Otherwise unremarkable large bowel with no diverticulosis. Vascular/Lymphatic: Atherosclerotic nonaneurysmal abdominal aorta. Mild bilateral external iliac lymphadenopathy measuring up to 1.0 cm on the left (series 8/image 86) and 1.2 cm on the right (series 8/image 85), stable  since 02/11/2019 CT. No new pathologically enlarged abdominopelvic nodes. Reproductive: Atrophic appearing prostate. Other: No pneumoperitoneum, ascites or focal fluid collection. Musculoskeletal: No aggressive appearing focal osseous lesions. Moderate thoracolumbar spondylosis. IMPRESSION: 1. Rectum moderately distended by stool. New mild circumferential rectal wall thickening with mild haziness of perirectal fat, cannot exclude stercoral colitis or other nonspecific proctitis. 2. Small to moderate bilateral dependent pleural effusions with associated dependent bibasilar atelectasis. Trace pericardial effusion/thickening. 3.  Cholelithiasis. 4. Stable mild bilateral external iliac lymphadenopathy, nonspecific, presumably reactive. 5. Coronary atherosclerosis. 6. Aortic Atherosclerosis (ICD10-I70.0). Electronically Signed   By: Ilona Sorrel M.D.   On: 05/14/2019 17:11   Dg Abd 1 View  Result Date: 04/27/2019 CLINICAL DATA:  Abdominal pain EXAM: ABDOMEN - 1 VIEW COMPARISON:  None. FINDINGS: The bowel gas pattern is normal. No radio-opaque calculi or other significant radiographic abnormality are seen. Sclerotic bone lesion in the right proximal femur likely reflecting a bone island. IMPRESSION: No acute abdominal abnormality. Electronically Signed   By: Kathreen Devoid   On: 04/27/2019 14:00   Dg Abd 2 Views  Result Date: 05/15/2019 CLINICAL DATA:  Lower GI bleed. EXAM: ABDOMEN - 2 VIEW COMPARISON:  CT scan 05/14/2019 FINDINGS: Supine and decubitus views of the abdomen show no gaseous bowel dilatation to suggest obstruction. No non dependent free gas visible on the decubitus film to suggest perforation. Visualized portions of the lower lungs appear clear with tiny bilateral pleural effusions. IMPRESSION: Negative. Electronically Signed   By: Misty Stanley M.D.   On: 05/15/2019 10:39  Subjective: Resting on RA, wants to return to snf. Wants regular diet.  Discharge Exam: Vitals:   05/23/19 2137 05/24/19 0617  BP: (!) 159/89 (!) 154/82  Pulse: 100 86  Resp: 16 16  Temp: 98.4 F (36.9 C) 98.6 F (37 C)  SpO2: 96% 100%   Vitals:   05/23/19 0520 05/23/19 1424 05/23/19 2137 05/24/19 0617  BP: (!) 154/98 (!) 153/101 (!) 159/89 (!) 154/82  Pulse: 99 93 100 86  Resp: '18 18 16 16  ' Temp: 98.5 F (36.9 C) 97.6 F (36.4 C) 98.4 F (36.9 C) 98.6 F (37 C)  TempSrc: Oral Axillary Oral Oral  SpO2: 100% 97% 96% 100%  Weight:      Height:        General: Pt is alert, awake, not in acute distress Cardiovascular: RRR, S1/S2 +, no rubs, no gallops Respiratory: CTA bilaterally, no wheezing, no rhonchi Abdominal: Soft, NT,  ND, bowel sounds + Extremities: no edema, no cyanosis. LT  BKA 1+ edema, wound on lft bka and rt le.   The results of significant diagnostics from this hospitalization (including imaging, microbiology, ancillary and laboratory) are listed below for reference.     Microbiology: Recent Results (from the past 240 hour(s))  SARS Coronavirus 2 by RT PCR (hospital order, performed in [facility] hospital lab) Nasopharyngeal Nasopharyngeal Swab     Status: None   Collection Time: 05/14/19  5:42 PM   Specimen: Nasopharyngeal Swab  Result Value Ref Range Status   SARS Coronavirus 2 NEGATIVE NEGATIVE Final    Comment: (NOTE) If result is NEGATIVE SARS-CoV-2 target nucleic acids are NOT DETECTED. The SARS-CoV-2 RNA is generally detectable in upper and lower  respiratory specimens during the acute phase of infection. The lowest  concentration of SARS-CoV-2 viral copies this assay can detect is 250  copies / mL. A negative result does not preclude SARS-CoV-2 infection  and should not be used as the sole basis for treatment  or other  patient management decisions.  A negative result may occur with  improper specimen collection / handling, submission of specimen other  than nasopharyngeal swab, presence of viral mutation(s) within the  areas targeted by this assay, and inadequate number of viral copies  (<250 copies / mL). A negative result must be combined with clinical  observations, patient history, and epidemiological information. If result is POSITIVE SARS-CoV-2 target nucleic acids are DETECTED. The SARS-CoV-2 RNA is generally detectable in upper and lower  respiratory specimens dur ing the acute phase of infection.  Positive  results are indicative of active infection with SARS-CoV-2.  Clinical  correlation with patient history and other diagnostic information is  necessary to determine patient infection status.  Positive results do  not rule out bacterial infection or co-infection with  other viruses. If result is PRESUMPTIVE POSTIVE SARS-CoV-2 nucleic acids MAY BE PRESENT.   A presumptive positive result was obtained on the submitted specimen  and confirmed on repeat testing.  While 2019 novel coronavirus  (SARS-CoV-2) nucleic acids may be present in the submitted sample  additional confirmatory testing may be necessary for epidemiological  and / or clinical management purposes  to differentiate between  SARS-CoV-2 and other Sarbecovirus currently known to infect humans.  If clinically indicated additional testing with an alternate test  methodology [phone]) is advised. The SARS-CoV-2 RNA is generally  detectable in upper and lower respiratory sp ecimens during the acute  phase of infection. The expected result is Negative. Fact Sheet for Patients:  StrictlyIdeas.no Fact Sheet for Healthcare Providers: BankingDealers.co.za This test is not yet approved or cleared by the [place] FDA and has been authorized for detection and/or diagnosis of SARS-CoV-2 by FDA under an Emergency Use Authorization (EUA).  This EUA will remain in effect (meaning this test can be used) for the duration of the COVID-19 declaration under Section 564(b)(1) of the Act, 21 U.S.C. section 360bbb-3(b)(1), unless the authorization is terminated or revoked sooner. Performed at [facility], [place] [address] [place], [place] [address]   SARS CORONAVIRUS 2 (TAT 6-24 HRS) Nasopharyngeal Nasopharyngeal Swab     Status: None   Collection Time: 05/18/19 10:48 AM   Specimen: Nasopharyngeal Swab  Result Value Ref Range Status   SARS Coronavirus 2 NEGATIVE NEGATIVE Final    Comment: (NOTE) SARS-CoV-2 target nucleic acids are NOT DETECTED. The SARS-CoV-2 RNA is generally detectable in upper and lower respiratory specimens during the acute phase of infection. Negative results do not preclude SARS-CoV-2 infection, do not rule out co-infections  with other pathogens, and should not be used as the sole basis for treatment or other patient management decisions. Negative results must be combined with clinical observations, patient history, and epidemiological information. The expected result is Negative. Fact Sheet for Patients: SugarRoll.be Fact Sheet for Healthcare Providers: https://www.woods-mathews.com/ This test is not yet approved or cleared by the [place] FDA and  has been authorized for detection and/or diagnosis of SARS-CoV-2 by FDA under an Emergency Use Authorization (EUA). This EUA will remain  in effect (meaning this test can be used) for the duration of the COVID-19 declaration under Section 56 4(b)(1) of the Act, 21 U.S.C. section 360bbb-3(b)(1), unless the authorization is terminated or revoked sooner. Performed at [facility], [place] [address], [place], [place] [address]   SARS CORONAVIRUS 2 (TAT 6-24 HRS) Nasopharyngeal Nasopharyngeal Swab     Status: None   Collection Time: 05/21/19 12:59 PM   Specimen: Nasopharyngeal Swab  Result  Value Ref Range Status   SARS Coronavirus 2 NEGATIVE NEGATIVE Final    Comment: (NOTE) SARS-CoV-2 target nucleic acids are NOT DETECTED. The SARS-CoV-2 RNA is generally detectable in upper and lower respiratory specimens during the acute phase of infection. Negative results do not preclude SARS-CoV-2 infection, do not rule out co-infections with other pathogens, and should not be used as the sole basis for treatment or other patient management decisions. Negative results must be combined with clinical observations, patient history, and epidemiological information. The expected result is Negative. Fact Sheet for Patients: SugarRoll.be Fact Sheet for Healthcare Providers: https://www.woods-mathews.com/ This test is not yet approved or cleared by the [place] FDA and  has been  authorized for detection and/or diagnosis of SARS-CoV-2 by FDA under an Emergency Use Authorization (EUA). This EUA will remain  in effect (meaning this test can be used) for the duration of the COVID-19 declaration under Section 56 4(b)(1) of the Act, 21 U.S.C. section 360bbb-3(b)(1), unless the authorization is terminated or revoked sooner. Performed at [facility], [place] [address], [place], [place] [address]      Labs: BNP (last 3 results) No results for input(s): BNP in the last 8760 hours. Basic Metabolic Panel: Recent Labs  Lab 05/18/19 0414 05/19/19 0623 05/20/19 0540 05/21/19 0757 05/22/19 0406 05/23/19 0259 05/24/19 0211  NA 136 134* 137 134* 134* 131*  --   K 3.6 4.1 3.5 4.1 4.2 3.5  --   CL 97* 96* 97* 97* 98 96*  --   CO2 24 22 21* 19* 22 24  --   GLUCOSE 72  70 68*  66* 75  75 75 80 74  74 79  BUN 13 21* 15 21* 26* 14  --   CREATININE 3.32* 4.42* 3.35* 4.42* 5.25* 3.69*  --   CALCIUM 7.5* 8.5* 8.1* 8.6* 8.5* 8.0*  --   MG 1.8  --   --   --   --   --   --   PHOS 3.0 4.8* 4.0 5.7* 6.0* 3.7  --    Liver Function Tests: Recent Labs  Lab 05/19/19 0623 05/20/19 0540 05/21/19 0757 05/22/19 0406 05/23/19 0259  ALBUMIN 2.1* 2.0* 2.1* 2.1* 2.1*   No results for input(s): LIPASE, AMYLASE in the last 168 hours. No results for input(s): AMMONIA in the last 168 hours. CBC: Recent Labs  Lab 05/20/19 0540 05/21/19 0757 05/22/19 0406 05/22/19 0718 05/23/19 0259  WBC  --   --   --  5.9  --   HGB 8.8* 8.8* 8.5* 8.5* 9.0*  HCT 27.2* 27.2* 26.5* 27.1* 28.0*  MCV  --   --   --  91.2  --   PLT  --   --   --  323  --    Cardiac Enzymes: No results for input(s): CKTOTAL, CKMB, CKMBINDEX, TROPONINI in the last 168 hours. BNP: Invalid input(s): POCBNP CBG: Recent Labs  Lab 05/23/19 0813 05/23/19 1130 05/23/19 1724 05/23/19 2133 05/24/19 0728  GLUCAP 95 90 117* 93 78   D-Dimer No results for input(s): DDIMER in the last 72 hours. Hgb A1c No  results for input(s): HGBA1C in the last 72 hours. Lipid Profile No results for input(s): CHOL, HDL, LDLCALC, TRIG, CHOLHDL, LDLDIRECT in the last 72 hours. Thyroid function studies No results for input(s): TSH, T4TOTAL, T3FREE, THYROIDAB in the last 72 hours.  Invalid input(s): FREET3 Anemia work up No results for input(s): VITAMINB12, FOLATE, FERRITIN, TIBC, IRON, RETICCTPCT in the last 52  hours. Urinalysis    Component Value Date/Time   COLORURINE YELLOW 03/10/2019 1054   APPEARANCEUR TURBID (A) 03/10/2019 1054   LABSPEC 1.020 03/10/2019 1054   PHURINE 5.0 03/10/2019 1054   GLUCOSEU 50 (A) 03/10/2019 1054   HGBUR SMALL (A) 03/10/2019 1054   BILIRUBINUR NEGATIVE 03/10/2019 1054   [place] 03/10/2019 1054   PROTEINUR >=300 (A) 03/10/2019 1054   NITRITE NEGATIVE 03/10/2019 1054   LEUKOCYTESUR LARGE (A) 03/10/2019 1054   Sepsis Labs Invalid input(s): PROCALCITONIN,  WBC,  LACTICIDVEN Microbiology Recent Results (from the past 240 hour(s))  SARS Coronavirus 2 by RT PCR (hospital order, performed in [place] hospital lab) Nasopharyngeal Nasopharyngeal Swab     Status: None   Collection Time: 05/14/19  5:42 PM   Specimen: Nasopharyngeal Swab  Result Value Ref Range Status   SARS Coronavirus 2 NEGATIVE NEGATIVE Final    Comment: (NOTE) If result is NEGATIVE SARS-CoV-2 target nucleic acids are NOT DETECTED. The SARS-CoV-2 RNA is generally detectable in upper and lower  respiratory specimens during the acute phase of infection. The lowest  concentration of SARS-CoV-2 viral copies this assay can detect is 250  copies / mL. A negative result does not preclude SARS-CoV-2 infection  and should not be used as the sole basis for treatment or other  patient management decisions.  A negative result may occur with  improper specimen collection / handling, submission of specimen other  than nasopharyngeal swab, presence of viral mutation(s) within the  areas targeted by this  assay, and inadequate number of viral copies  (<250 copies / mL). A negative result must be combined with clinical  observations, patient history, and epidemiological information. If result is POSITIVE SARS-CoV-2 target nucleic acids are DETECTED. The SARS-CoV-2 RNA is generally detectable in upper and lower  respiratory specimens dur ing the acute phase of infection.  Positive  results are indicative of active infection with SARS-CoV-2.  Clinical  correlation with patient history and other diagnostic information is  necessary to determine patient infection status.  Positive results do  not rule out bacterial infection or co-infection with other viruses. If result is PRESUMPTIVE POSTIVE SARS-CoV-2 nucleic acids MAY BE PRESENT.   A presumptive positive result was obtained on the submitted specimen  and confirmed on repeat testing.  While 2019 novel coronavirus  (SARS-CoV-2) nucleic acids may be present in the submitted sample  additional confirmatory testing may be necessary for epidemiological  and / or clinical management purposes  to differentiate between  SARS-CoV-2 and other Sarbecovirus currently known to infect humans.  If clinically indicated additional testing with an alternate test  methodology [phone]) is advised. The SARS-CoV-2 RNA is generally  detectable in upper and lower respiratory sp ecimens during the acute  phase of infection. The expected result is Negative. Fact Sheet for Patients:  StrictlyIdeas.no Fact Sheet for Healthcare Providers: BankingDealers.co.za This test is not yet approved or cleared by the [place] FDA and has been authorized for detection and/or diagnosis of SARS-CoV-2 by FDA under an Emergency Use Authorization (EUA).  This EUA will remain in effect (meaning this test can be used) for the duration of the COVID-19 declaration under Section 564(b)(1) of the Act, 21 U.S.C. section 360bbb-3(b)(1),  unless the authorization is terminated or revoked sooner. Performed at [facility], [place] [address], [place], [place] [address]   SARS CORONAVIRUS 2 (TAT 6-24 HRS) Nasopharyngeal Nasopharyngeal Swab     Status: None   Collection Time: 05/18/19 10:48 AM  Specimen: Nasopharyngeal Swab  Result Value Ref Range Status   SARS Coronavirus 2 NEGATIVE NEGATIVE Final    Comment: (NOTE) SARS-CoV-2 target nucleic acids are NOT DETECTED. The SARS-CoV-2 RNA is generally detectable in upper and lower respiratory specimens during the acute phase of infection. Negative results do not preclude SARS-CoV-2 infection, do not rule out co-infections with other pathogens, and should not be used as the sole basis for treatment or other patient management decisions. Negative results must be combined with clinical observations, patient history, and epidemiological information. The expected result is Negative. Fact Sheet for Patients: SugarRoll.be Fact Sheet for Healthcare Providers: https://www.woods-mathews.com/ This test is not yet approved or cleared by the [place] FDA and  has been authorized for detection and/or diagnosis of SARS-CoV-2 by FDA under an Emergency Use Authorization (EUA). This EUA will remain  in effect (meaning this test can be used) for the duration of the COVID-19 declaration under Section 56 4(b)(1) of the Act, 21 U.S.C. section 360bbb-3(b)(1), unless the authorization is terminated or revoked sooner. Performed at [facility], [place] [address], [place], [place] [address]   SARS CORONAVIRUS 2 (TAT 6-24 HRS) Nasopharyngeal Nasopharyngeal Swab     Status: None   Collection Time: 05/21/19 12:59 PM   Specimen: Nasopharyngeal Swab  Result Value Ref Range Status   SARS Coronavirus 2 NEGATIVE NEGATIVE Final    Comment: (NOTE) SARS-CoV-2 target nucleic acids are NOT DETECTED. The SARS-CoV-2 RNA is generally detectable in upper and  lower respiratory specimens during the acute phase of infection. Negative results do not preclude SARS-CoV-2 infection, do not rule out co-infections with other pathogens, and should not be used as the sole basis for treatment or other patient management decisions. Negative results must be combined with clinical observations, patient history, and epidemiological information. The expected result is Negative. Fact Sheet for Patients: SugarRoll.be Fact Sheet for Healthcare Providers: https://www.woods-mathews.com/ This test is not yet approved or cleared by the [place] FDA and  has been authorized for detection and/or diagnosis of SARS-CoV-2 by FDA under an Emergency Use Authorization (EUA). This EUA will remain  in effect (meaning this test can be used) for the duration of the COVID-19 declaration under Section 56 4(b)(1) of the Act, 21 U.S.C. section 360bbb-3(b)(1), unless the authorization is terminated or revoked sooner. Performed at [facility], [place] [address], [place], [place] [address]      Time coordinating discharge: 35 minutes  SIGNED:   Antonieta Pert, MD  [facility] 05/24/2019, 9:51 AM  If 7PM-7AM, please contact night-coverage [email or web address]

## 2019-05-24 NOTE — Telephone Encounter (Signed)
Letter of pre-op instructions faxed to [place] at [facility] at [phone]

## 2019-05-24 NOTE — Progress Notes (Signed)
Attempted twice to call for report at [facility] , [place] not available

## 2019-05-24 NOTE — TOC Transition Note (Signed)
Transition of Care [facility]) - CM/SW Discharge Note   Patient Details  Name: Geon Claude MRN: [id number] Date of Birth: 1970/08/09  [facility]) CM/SW Contact:  Domnique Vantine, Francetta Found, LCSW Phone Number: 05/24/2019, 9:48 AM   Clinical Narrative:     Acute Care Supervisor spoke with Terri Piedra, renal navigator, regarding patient plans for discharge.  Patient is medically stable for discharge and awaiting insurance authorization.  ACS contacted [facility] who states that they now have authorization and are ready for patient to admit at any time.  Per Jaclyn Shaggy, transportation has been arranged for patient to receive outpatient dialysis at 12pm and then transport from the dialysis center to [facility] - facility updated.  ACS to provide RN with number for report prior to discharge.    Clinical Social Worker will sign off for now as social work intervention is no longer needed. Please consult [place] again if new need arises.   Final next level of care: Skilled Nursing Facility Barriers to Discharge: Barriers Resolved   Patient Goals and CMS Choice Patient states their goals for this hospitalization and ongoing recovery are:: to go back to SNF      Discharge Placement                  Name of family member notified: Patient to update family of discharge plans Patient and family notified of of transfer: 05/24/19  Discharge Plan and Services In-house Referral: Clinical Social Work                                   Social Determinants of Health (SDOH) Interventions     Readmission Risk Interventions No flowsheet data found.

## 2019-05-24 NOTE — Telephone Encounter (Signed)
Spoke with Hoyle Sauer at [facility]. Surgery for 05/25/2019 postponed and will call back with re-scheduled date and time.

## 2019-05-24 NOTE — Progress Notes (Signed)
Renal Navigator spoke with TOC Supervisor/J. Scinto about patient being medically cleared for d/c since 05/18/19 per MD notes. Supervisor states issues with receiving insurance authorization to return to SNF. Today is patient's HD day and Renal Navigator is trying to get patient discharged early enough to get him to OP HD clinic for treatment today. Supervisor provided phone number to Navigator to call to inquire about SNF auth. Renal Navigator called number [phone] and spoke with representative/Amy who states Michael Riggs has been approved. [facility] states they can accommodate patient as long as he can arrive by 12:00pm. [facility] transportation/Carolyn states she can pick patient up after treatment.  Renal Navigator discussed plan with Primary/Dr. Lupita Leash and asked Renal PA/S. Collins to send orders to OP HD clinic. [facility] to provide transportation to OP HD clinic and will be here at 11:00am for pick up. Patient will not have HD inpatient prior to discharge today.  Alphonzo Cruise, [place] Renal Navigator [phone]

## 2019-05-26 DIAGNOSIS — Z23 Encounter for immunization: Secondary | ICD-10-CM | POA: Diagnosis not present

## 2019-05-26 DIAGNOSIS — D631 Anemia in chronic kidney disease: Secondary | ICD-10-CM | POA: Diagnosis not present

## 2019-05-26 DIAGNOSIS — Z992 Dependence on renal dialysis: Secondary | ICD-10-CM | POA: Diagnosis not present

## 2019-05-26 DIAGNOSIS — D689 Coagulation defect, unspecified: Secondary | ICD-10-CM | POA: Diagnosis not present

## 2019-05-26 DIAGNOSIS — N2581 Secondary hyperparathyroidism of renal origin: Secondary | ICD-10-CM | POA: Diagnosis not present

## 2019-05-26 DIAGNOSIS — E875 Hyperkalemia: Secondary | ICD-10-CM | POA: Diagnosis not present

## 2019-05-26 DIAGNOSIS — D509 Iron deficiency anemia, unspecified: Secondary | ICD-10-CM | POA: Diagnosis not present

## 2019-05-26 DIAGNOSIS — E1121 Type 2 diabetes mellitus with diabetic nephropathy: Secondary | ICD-10-CM | POA: Diagnosis not present

## 2019-05-26 DIAGNOSIS — L299 Pruritus, unspecified: Secondary | ICD-10-CM | POA: Diagnosis not present

## 2019-05-26 DIAGNOSIS — N186 End stage renal disease: Secondary | ICD-10-CM | POA: Diagnosis not present

## 2019-05-27 DIAGNOSIS — E1121 Type 2 diabetes mellitus with diabetic nephropathy: Secondary | ICD-10-CM | POA: Diagnosis not present

## 2019-05-27 DIAGNOSIS — D631 Anemia in chronic kidney disease: Secondary | ICD-10-CM | POA: Diagnosis not present

## 2019-05-27 DIAGNOSIS — L299 Pruritus, unspecified: Secondary | ICD-10-CM | POA: Diagnosis not present

## 2019-05-27 DIAGNOSIS — D509 Iron deficiency anemia, unspecified: Secondary | ICD-10-CM | POA: Diagnosis not present

## 2019-05-27 DIAGNOSIS — E875 Hyperkalemia: Secondary | ICD-10-CM | POA: Diagnosis not present

## 2019-05-27 DIAGNOSIS — N2581 Secondary hyperparathyroidism of renal origin: Secondary | ICD-10-CM | POA: Diagnosis not present

## 2019-05-27 DIAGNOSIS — Z23 Encounter for immunization: Secondary | ICD-10-CM | POA: Diagnosis not present

## 2019-05-27 DIAGNOSIS — D689 Coagulation defect, unspecified: Secondary | ICD-10-CM | POA: Diagnosis not present

## 2019-05-27 DIAGNOSIS — N186 End stage renal disease: Secondary | ICD-10-CM | POA: Diagnosis not present

## 2019-05-27 DIAGNOSIS — Z992 Dependence on renal dialysis: Secondary | ICD-10-CM | POA: Diagnosis not present

## 2019-05-29 DIAGNOSIS — N2581 Secondary hyperparathyroidism of renal origin: Secondary | ICD-10-CM | POA: Diagnosis not present

## 2019-05-29 DIAGNOSIS — N186 End stage renal disease: Secondary | ICD-10-CM | POA: Diagnosis not present

## 2019-05-29 DIAGNOSIS — E875 Hyperkalemia: Secondary | ICD-10-CM | POA: Diagnosis not present

## 2019-05-29 DIAGNOSIS — D631 Anemia in chronic kidney disease: Secondary | ICD-10-CM | POA: Diagnosis not present

## 2019-05-29 DIAGNOSIS — Z23 Encounter for immunization: Secondary | ICD-10-CM | POA: Diagnosis not present

## 2019-05-29 DIAGNOSIS — D509 Iron deficiency anemia, unspecified: Secondary | ICD-10-CM | POA: Diagnosis not present

## 2019-05-29 DIAGNOSIS — Z992 Dependence on renal dialysis: Secondary | ICD-10-CM | POA: Diagnosis not present

## 2019-05-29 DIAGNOSIS — L299 Pruritus, unspecified: Secondary | ICD-10-CM | POA: Diagnosis not present

## 2019-05-29 DIAGNOSIS — D689 Coagulation defect, unspecified: Secondary | ICD-10-CM | POA: Diagnosis not present

## 2019-05-29 DIAGNOSIS — E1121 Type 2 diabetes mellitus with diabetic nephropathy: Secondary | ICD-10-CM | POA: Diagnosis not present

## 2019-05-30 ENCOUNTER — Encounter: Payer: Self-pay | Admitting: Orthopedic Surgery

## 2019-05-30 ENCOUNTER — Ambulatory Visit (INDEPENDENT_AMBULATORY_CARE_PROVIDER_SITE_OTHER): Payer: Medicare Other | Admitting: Orthopedic Surgery

## 2019-05-30 ENCOUNTER — Other Ambulatory Visit: Payer: Self-pay

## 2019-05-30 VITALS — Ht 70.0 in | Wt 234.6 lb

## 2019-05-30 DIAGNOSIS — R234 Changes in skin texture: Secondary | ICD-10-CM

## 2019-05-30 DIAGNOSIS — Z89512 Acquired absence of left leg below knee: Secondary | ICD-10-CM

## 2019-05-30 DIAGNOSIS — E1151 Type 2 diabetes mellitus with diabetic peripheral angiopathy without gangrene: Secondary | ICD-10-CM | POA: Diagnosis not present

## 2019-05-30 MED ORDER — COLLAGENASE 250 UNIT/GM EX OINT: 1.0000 "application " | TOPICAL_OINTMENT | Freq: Every day | CUTANEOUS | 3 refills | Status: DC

## 2019-05-30 NOTE — Progress Notes (Signed)
Office Visit Note   Patient: Michael Riggs           Date of Birth: Oct 22, 1970           MRN: [id number] Visit Date: 05/30/2019              Requested by: [name], MD [address] [place],  [place] [address] PCP: [name], MD  Chief Complaint  Patient presents with  . 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 The patient has been healing slowly.  Using santyl and silvadene dressings at skilled nursing.  Assessment & Plan: Visit Diagnoses:  1. Diabetes mellitus with peripheral vascular disease (HCC)   2. Calciphylaxis   3. Status post below-knee amputation of left lower extremity ([place])   4. Eschar of multiple sites     Plan: Silvadene dressing was applied today. patient was given a new prescription for Santyl ointment, ordered the santyl to be applied to the black eschar daily plus dry dressing and change daily.  Follow-Up Instructions: Return in about 3 weeks (around 06/20/2019).   Ortho Exam  Patient is alert, oriented, no adenopathy, well-dressed, normal affect, normal respiratory effort. Examination patient is ambulating in a wheelchair.  He has a stable left transtibial amputation right leg has a good dorsalis pedis pulse he has multiple massive black eschars on both legs from the calciphylaxis.  Around the edges there is good superficial epithelialization there is no cellulitis no drainage no signs of infection.  After informed consent a 10 blade knife was used to debride skin and soft tissue from multiple wounds.  There is good healthy granulation tissue good superficial epithelialization around the wound edges.   Pleased with interval healing.  Imaging: No results found. No images are attached to the encounter.  Labs: Lab Results  Component Value Date   HGBA1C  5.3 05/16/2019   HGBA1C 5.0 01/31/2019   ESRSEDRATE >140 (H) 03/28/2019   CRP 19.4 (H) 03/28/2019   REPTSTATUS 04/14/2019 FINAL 04/11/2019   GRAMSTAIN  04/11/2019    RARE WBC PRESENT,BOTH PMN AND MONONUCLEAR NO ORGANISMS SEEN    CULT  04/11/2019    RARE Consistent with normal respiratory flora. Performed at [facility], [place] [address] [place], [place] [address]    Doy Hutching ENTEROCOCCUS FAECIUM (A) 03/10/2019     Lab Results  Component Value Date   ALBUMIN 2.1 (L) 05/23/2019   ALBUMIN 2.1 (L) 05/22/2019   ALBUMIN 2.1 (L) 05/21/2019   PREALBUMIN 9.1 (L) 03/28/2019    Lab Results  Component Value Date   MG 1.8 05/18/2019   MG 1.8 04/12/2019   MG 1.8 04/11/2019   No results found for: [facility]  Lab Results  Component Value Date   PREALBUMIN 9.1 (L) 03/28/2019   CBC EXTENDED Latest Ref Rng & Units 05/23/2019 05/22/2019 05/22/2019  WBC 4.0 - 10.5 K/uL - 5.9 -  RBC 4.22 - 5.81 MIL/uL - 2.97(L) -  HGB 13.0 - 17.0 g/dL 9.0(L) 8.5(L) 8.5(L)  HCT 39.0 - 52.0 % 28.0(L) 27.1(L) 26.5(L)  PLT 150 - 400 K/uL - 323 -  NEUTROABS 1.7 - 7.7 K/uL - - -  LYMPHSABS 0.7 - 4.0 K/uL - - -     Body mass index is 33.66 kg/m.  Orders:  No orders of the defined  types were placed in this encounter.  No orders of the defined types were placed in this encounter.    Procedures: No procedures performed  Clinical Data: No additional findings.  ROS:  All other systems negative, except as noted in the HPI. Review of Systems  Constitutional: Negative for chills and fever.  Skin: Positive for wound. Negative for color change.    Objective: Vital Signs: Ht 5\' 10"  (1.778 m)   Wt 234 lb 9.1 oz (106.4 kg)   BMI 33.66 kg/m   Specialty Comments:  No specialty comments available.  PMFS History: Patient Active Problem List   Diagnosis Date Noted  . Lower GI bleed 05/14/2019  . Pressure injury of skin 04/27/2019  . Palliative care encounter   . Goals of care,  counseling/discussion   . Calciphylaxis   . Wound infection 03/28/2019  . Hypertension   . Dyslipidemia   . Diabetes mellitus with peripheral vascular disease ([place])   . Depression   . Physical deconditioning   . Pruritus   . Scrotal pain   . Labile blood pressure   . Scrotal edema   . Status post below-knee amputation of left lower extremity ([place])   . Slow transit constipation   . Leukocytosis   . Sleep disturbance   . Essential hypertension   . Anemia of chronic disease   . Controlled type 2 diabetes mellitus with hyperglycemia, without long-term current use of insulin ([place])   . ESRD on dialysis ([place])   . Urinary retention   . Debility 02/09/2019  . Fluid overload 02/04/2019  . Hyperkalemia 01/30/2019  . ARF (acute renal failure) ([place]) 01/30/2019  . Hypokalemia 01/30/2019  . Hypoglycemia 01/30/2019  . Syphilis 01/30/2019  . Macrocytic anemia 01/30/2019  . End stage renal disease ([place])   . ESRD (end stage renal disease) on dialysis ([place]) 01/2019  . PDR (proliferative diabetic retinopathy) ([place]) 12/14/2013  . Venous hypertension 09/22/2013  . Diabetes mellitus type 2 in obese ([place]) 07/04/2013  . Habitual alcohol use 07/04/2013  . Obesity, Class II, BMI 35-39.9, with comorbidity 07/04/2013   Past Medical History:  Diagnosis Date  . Anemia   . Depression   . Diabetes mellitus with peripheral vascular disease ([place])   . Dyslipidemia   . Dyspnea   . ESRD (end stage renal disease) on dialysis ([place]) 01/2019   MWF  . GERD (gastroesophageal reflux disease)   . Hypertension   . Physical deconditioning   . Syphilis 01/2019    History reviewed. No pertinent family history.  Past Surgical History:  Procedure Laterality Date  . AV FISTULA PLACEMENT Left 01/09/2019   Procedure: ARTERIOVENOUS (AV) FISTULA CREATION LEFT UPPER ARM;  Surgeon: [name], MD;  Location: [facility] OR;  Service: Vascular;  Laterality: Left;  . below the knee amputation Left   . EYE SURGERY    . FLEXIBLE  SIGMOIDOSCOPY N/A 05/15/2019   Procedure: FLEXIBLE SIGMOIDOSCOPY;  Surgeon: Rush Landmark Telford Nab., MD;  Location: [facility];  Service: Gastroenterology;  Laterality: N/A;  . HEMOSTASIS CLIP PLACEMENT  05/15/2019   Procedure: HEMOSTASIS CLIP PLACEMENT;  Surgeon: Irving Copas., MD;  Location: [facility];  Service: Gastroenterology;;  . HOT HEMOSTASIS N/A 05/15/2019   Procedure: HOT HEMOSTASIS (ARGON PLASMA COAGULATION/BICAP);  Surgeon: Irving Copas., MD;  Location: [facility];  Service: Gastroenterology;  Laterality: N/A;  . IR FLUORO GUIDE CV LINE RIGHT  01/31/2019  . IR FLUORO GUIDE CV LINE RIGHT  02/03/2019  . IR US GUIDE VASC ACCESS RIGHT  01/31/2019  . IR US GUIDE VASC ACCESS RIGHT  02/03/2019  . SCLEROTHERAPY  05/15/2019   Procedure: Clide Deutscher;  Surgeon: Mansouraty, Telford Nab., MD;  Location: [facility];  Service: Gastroenterology;;   Social History   Occupational History  . Not on file  Tobacco Use  . Smoking status: Former Smoker    Types: Cigarettes    Start date: 11/18/2018    Quit date: 01/18/2019    Years since quitting: 0.3  . Smokeless tobacco: Never Used  . Tobacco comment: pt stated got bored with it  Substance and Sexual Activity  . Alcohol use: Not Currently    Comment: heavy drinker in the past, none since 07/19/18  . Drug use: Not Currently  . Sexual activity: Not Currently

## 2019-05-31 ENCOUNTER — Telehealth: Payer: Self-pay | Admitting: *Deleted

## 2019-05-31 DIAGNOSIS — N2581 Secondary hyperparathyroidism of renal origin: Secondary | ICD-10-CM | POA: Diagnosis not present

## 2019-05-31 DIAGNOSIS — D509 Iron deficiency anemia, unspecified: Secondary | ICD-10-CM | POA: Diagnosis not present

## 2019-05-31 DIAGNOSIS — E875 Hyperkalemia: Secondary | ICD-10-CM | POA: Diagnosis not present

## 2019-05-31 DIAGNOSIS — Z23 Encounter for immunization: Secondary | ICD-10-CM | POA: Diagnosis not present

## 2019-05-31 DIAGNOSIS — Z992 Dependence on renal dialysis: Secondary | ICD-10-CM | POA: Diagnosis not present

## 2019-05-31 DIAGNOSIS — D689 Coagulation defect, unspecified: Secondary | ICD-10-CM | POA: Diagnosis not present

## 2019-05-31 DIAGNOSIS — D631 Anemia in chronic kidney disease: Secondary | ICD-10-CM | POA: Diagnosis not present

## 2019-05-31 DIAGNOSIS — E1121 Type 2 diabetes mellitus with diabetic nephropathy: Secondary | ICD-10-CM | POA: Diagnosis not present

## 2019-05-31 DIAGNOSIS — N186 End stage renal disease: Secondary | ICD-10-CM | POA: Diagnosis not present

## 2019-05-31 DIAGNOSIS — L299 Pruritus, unspecified: Secondary | ICD-10-CM | POA: Diagnosis not present

## 2019-05-31 NOTE — Telephone Encounter (Signed)
RN from [facility] called to clarify Rx cholestryamine had been d/c'd.  Pt is refusing to take it because he said it was d/c'd at hospital.  [facility] reviewed chart and did not find evidence of Rx being d/c'd.

## 2019-06-01 DIAGNOSIS — K219 Gastro-esophageal reflux disease without esophagitis: Secondary | ICD-10-CM | POA: Diagnosis not present

## 2019-06-01 DIAGNOSIS — G4700 Insomnia, unspecified: Secondary | ICD-10-CM | POA: Diagnosis not present

## 2019-06-01 DIAGNOSIS — K922 Gastrointestinal hemorrhage, unspecified: Secondary | ICD-10-CM | POA: Diagnosis not present

## 2019-06-01 DIAGNOSIS — E119 Type 2 diabetes mellitus without complications: Secondary | ICD-10-CM | POA: Diagnosis not present

## 2019-06-02 DIAGNOSIS — N2581 Secondary hyperparathyroidism of renal origin: Secondary | ICD-10-CM | POA: Diagnosis not present

## 2019-06-02 DIAGNOSIS — Z992 Dependence on renal dialysis: Secondary | ICD-10-CM | POA: Diagnosis not present

## 2019-06-02 DIAGNOSIS — D509 Iron deficiency anemia, unspecified: Secondary | ICD-10-CM | POA: Diagnosis not present

## 2019-06-02 DIAGNOSIS — Z23 Encounter for immunization: Secondary | ICD-10-CM | POA: Diagnosis not present

## 2019-06-02 DIAGNOSIS — E1121 Type 2 diabetes mellitus with diabetic nephropathy: Secondary | ICD-10-CM | POA: Diagnosis not present

## 2019-06-02 DIAGNOSIS — D631 Anemia in chronic kidney disease: Secondary | ICD-10-CM | POA: Diagnosis not present

## 2019-06-02 DIAGNOSIS — N186 End stage renal disease: Secondary | ICD-10-CM | POA: Diagnosis not present

## 2019-06-02 DIAGNOSIS — D689 Coagulation defect, unspecified: Secondary | ICD-10-CM | POA: Diagnosis not present

## 2019-06-02 DIAGNOSIS — E875 Hyperkalemia: Secondary | ICD-10-CM | POA: Diagnosis not present

## 2019-06-02 DIAGNOSIS — L299 Pruritus, unspecified: Secondary | ICD-10-CM | POA: Diagnosis not present

## 2019-06-03 DIAGNOSIS — D509 Iron deficiency anemia, unspecified: Secondary | ICD-10-CM | POA: Diagnosis not present

## 2019-06-03 DIAGNOSIS — N186 End stage renal disease: Secondary | ICD-10-CM | POA: Diagnosis not present

## 2019-06-03 DIAGNOSIS — E1121 Type 2 diabetes mellitus with diabetic nephropathy: Secondary | ICD-10-CM | POA: Diagnosis not present

## 2019-06-03 DIAGNOSIS — D689 Coagulation defect, unspecified: Secondary | ICD-10-CM | POA: Diagnosis not present

## 2019-06-03 DIAGNOSIS — Z23 Encounter for immunization: Secondary | ICD-10-CM | POA: Diagnosis not present

## 2019-06-03 DIAGNOSIS — L299 Pruritus, unspecified: Secondary | ICD-10-CM | POA: Diagnosis not present

## 2019-06-03 DIAGNOSIS — D631 Anemia in chronic kidney disease: Secondary | ICD-10-CM | POA: Diagnosis not present

## 2019-06-03 DIAGNOSIS — E875 Hyperkalemia: Secondary | ICD-10-CM | POA: Diagnosis not present

## 2019-06-03 DIAGNOSIS — N2581 Secondary hyperparathyroidism of renal origin: Secondary | ICD-10-CM | POA: Diagnosis not present

## 2019-06-03 DIAGNOSIS — Z992 Dependence on renal dialysis: Secondary | ICD-10-CM | POA: Diagnosis not present

## 2019-06-05 DIAGNOSIS — D631 Anemia in chronic kidney disease: Secondary | ICD-10-CM | POA: Diagnosis not present

## 2019-06-05 DIAGNOSIS — N2581 Secondary hyperparathyroidism of renal origin: Secondary | ICD-10-CM | POA: Diagnosis not present

## 2019-06-05 DIAGNOSIS — N186 End stage renal disease: Secondary | ICD-10-CM | POA: Diagnosis not present

## 2019-06-05 DIAGNOSIS — E1121 Type 2 diabetes mellitus with diabetic nephropathy: Secondary | ICD-10-CM | POA: Diagnosis not present

## 2019-06-05 DIAGNOSIS — E875 Hyperkalemia: Secondary | ICD-10-CM | POA: Diagnosis not present

## 2019-06-05 DIAGNOSIS — L299 Pruritus, unspecified: Secondary | ICD-10-CM | POA: Diagnosis not present

## 2019-06-05 DIAGNOSIS — D509 Iron deficiency anemia, unspecified: Secondary | ICD-10-CM | POA: Diagnosis not present

## 2019-06-05 DIAGNOSIS — D689 Coagulation defect, unspecified: Secondary | ICD-10-CM | POA: Diagnosis not present

## 2019-06-05 DIAGNOSIS — Z992 Dependence on renal dialysis: Secondary | ICD-10-CM | POA: Diagnosis not present

## 2019-06-05 DIAGNOSIS — Z23 Encounter for immunization: Secondary | ICD-10-CM | POA: Diagnosis not present

## 2019-06-06 ENCOUNTER — Telehealth: Payer: Self-pay

## 2019-06-06 NOTE — Telephone Encounter (Signed)
-----   Message from Timothy Lasso, RN sent at 05/16/2019  3:54 PM EDT ----- Regarding: FW: follow up appt  ----- Message ----- From: Clearence Cheek Sent: 05/16/2019   3:43 PM EDT To: Timothy Lasso, RN, Irving Copas., MD Subject: follow up appt                                 Hi.  This pt needs fup with Dr Jerilynn Mages or Anderson Malta PA.  Timing about 3 to 4 weeks, though he is at [facility] rehab currently and may need to delay a bit longer than that time frame.   Fup of rectal ulcer and bleeding, diarrhea, constipation. Thanks, Judson Roch

## 2019-06-07 DIAGNOSIS — Z992 Dependence on renal dialysis: Secondary | ICD-10-CM | POA: Diagnosis not present

## 2019-06-07 DIAGNOSIS — L299 Pruritus, unspecified: Secondary | ICD-10-CM | POA: Diagnosis not present

## 2019-06-07 DIAGNOSIS — Z23 Encounter for immunization: Secondary | ICD-10-CM | POA: Diagnosis not present

## 2019-06-07 DIAGNOSIS — N2581 Secondary hyperparathyroidism of renal origin: Secondary | ICD-10-CM | POA: Diagnosis not present

## 2019-06-07 DIAGNOSIS — D509 Iron deficiency anemia, unspecified: Secondary | ICD-10-CM | POA: Diagnosis not present

## 2019-06-07 DIAGNOSIS — D689 Coagulation defect, unspecified: Secondary | ICD-10-CM | POA: Diagnosis not present

## 2019-06-07 DIAGNOSIS — N186 End stage renal disease: Secondary | ICD-10-CM | POA: Diagnosis not present

## 2019-06-07 DIAGNOSIS — E1121 Type 2 diabetes mellitus with diabetic nephropathy: Secondary | ICD-10-CM | POA: Diagnosis not present

## 2019-06-07 DIAGNOSIS — D631 Anemia in chronic kidney disease: Secondary | ICD-10-CM | POA: Diagnosis not present

## 2019-06-07 DIAGNOSIS — E875 Hyperkalemia: Secondary | ICD-10-CM | POA: Diagnosis not present

## 2019-06-09 DIAGNOSIS — Z992 Dependence on renal dialysis: Secondary | ICD-10-CM | POA: Diagnosis not present

## 2019-06-09 DIAGNOSIS — D689 Coagulation defect, unspecified: Secondary | ICD-10-CM | POA: Diagnosis not present

## 2019-06-09 DIAGNOSIS — L299 Pruritus, unspecified: Secondary | ICD-10-CM | POA: Diagnosis not present

## 2019-06-09 DIAGNOSIS — N186 End stage renal disease: Secondary | ICD-10-CM | POA: Diagnosis not present

## 2019-06-09 DIAGNOSIS — D509 Iron deficiency anemia, unspecified: Secondary | ICD-10-CM | POA: Diagnosis not present

## 2019-06-09 DIAGNOSIS — E1121 Type 2 diabetes mellitus with diabetic nephropathy: Secondary | ICD-10-CM | POA: Diagnosis not present

## 2019-06-09 DIAGNOSIS — D631 Anemia in chronic kidney disease: Secondary | ICD-10-CM | POA: Diagnosis not present

## 2019-06-09 DIAGNOSIS — N2581 Secondary hyperparathyroidism of renal origin: Secondary | ICD-10-CM | POA: Diagnosis not present

## 2019-06-09 DIAGNOSIS — E875 Hyperkalemia: Secondary | ICD-10-CM | POA: Diagnosis not present

## 2019-06-09 DIAGNOSIS — Z23 Encounter for immunization: Secondary | ICD-10-CM | POA: Diagnosis not present

## 2019-06-10 DIAGNOSIS — L299 Pruritus, unspecified: Secondary | ICD-10-CM | POA: Diagnosis not present

## 2019-06-10 DIAGNOSIS — N186 End stage renal disease: Secondary | ICD-10-CM | POA: Diagnosis not present

## 2019-06-10 DIAGNOSIS — N2581 Secondary hyperparathyroidism of renal origin: Secondary | ICD-10-CM | POA: Diagnosis not present

## 2019-06-10 DIAGNOSIS — E1121 Type 2 diabetes mellitus with diabetic nephropathy: Secondary | ICD-10-CM | POA: Diagnosis not present

## 2019-06-10 DIAGNOSIS — D631 Anemia in chronic kidney disease: Secondary | ICD-10-CM | POA: Diagnosis not present

## 2019-06-10 DIAGNOSIS — Z23 Encounter for immunization: Secondary | ICD-10-CM | POA: Diagnosis not present

## 2019-06-10 DIAGNOSIS — E875 Hyperkalemia: Secondary | ICD-10-CM | POA: Diagnosis not present

## 2019-06-10 DIAGNOSIS — D509 Iron deficiency anemia, unspecified: Secondary | ICD-10-CM | POA: Diagnosis not present

## 2019-06-10 DIAGNOSIS — D689 Coagulation defect, unspecified: Secondary | ICD-10-CM | POA: Diagnosis not present

## 2019-06-10 DIAGNOSIS — Z992 Dependence on renal dialysis: Secondary | ICD-10-CM | POA: Diagnosis not present

## 2019-06-11 DIAGNOSIS — I10 Essential (primary) hypertension: Secondary | ICD-10-CM | POA: Diagnosis not present

## 2019-06-11 DIAGNOSIS — E785 Hyperlipidemia, unspecified: Secondary | ICD-10-CM | POA: Diagnosis not present

## 2019-06-11 DIAGNOSIS — E119 Type 2 diabetes mellitus without complications: Secondary | ICD-10-CM | POA: Diagnosis not present

## 2019-06-11 DIAGNOSIS — K219 Gastro-esophageal reflux disease without esophagitis: Secondary | ICD-10-CM | POA: Diagnosis not present

## 2019-06-12 DIAGNOSIS — E1121 Type 2 diabetes mellitus with diabetic nephropathy: Secondary | ICD-10-CM | POA: Diagnosis not present

## 2019-06-12 DIAGNOSIS — N186 End stage renal disease: Secondary | ICD-10-CM | POA: Diagnosis not present

## 2019-06-12 DIAGNOSIS — E875 Hyperkalemia: Secondary | ICD-10-CM | POA: Diagnosis not present

## 2019-06-12 DIAGNOSIS — D509 Iron deficiency anemia, unspecified: Secondary | ICD-10-CM | POA: Diagnosis not present

## 2019-06-12 DIAGNOSIS — Z23 Encounter for immunization: Secondary | ICD-10-CM | POA: Diagnosis not present

## 2019-06-12 DIAGNOSIS — D689 Coagulation defect, unspecified: Secondary | ICD-10-CM | POA: Diagnosis not present

## 2019-06-12 DIAGNOSIS — N2581 Secondary hyperparathyroidism of renal origin: Secondary | ICD-10-CM | POA: Diagnosis not present

## 2019-06-12 DIAGNOSIS — Z992 Dependence on renal dialysis: Secondary | ICD-10-CM | POA: Diagnosis not present

## 2019-06-12 DIAGNOSIS — D631 Anemia in chronic kidney disease: Secondary | ICD-10-CM | POA: Diagnosis not present

## 2019-06-12 DIAGNOSIS — L299 Pruritus, unspecified: Secondary | ICD-10-CM | POA: Diagnosis not present

## 2019-06-12 NOTE — Telephone Encounter (Signed)
Spoke with Michael Riggs at [facility] and was advised to fax appt to [phone].  Appt faxed

## 2019-06-14 DIAGNOSIS — Z23 Encounter for immunization: Secondary | ICD-10-CM | POA: Diagnosis not present

## 2019-06-14 DIAGNOSIS — D631 Anemia in chronic kidney disease: Secondary | ICD-10-CM | POA: Diagnosis not present

## 2019-06-14 DIAGNOSIS — E1121 Type 2 diabetes mellitus with diabetic nephropathy: Secondary | ICD-10-CM | POA: Diagnosis not present

## 2019-06-14 DIAGNOSIS — N2581 Secondary hyperparathyroidism of renal origin: Secondary | ICD-10-CM | POA: Diagnosis not present

## 2019-06-14 DIAGNOSIS — Z992 Dependence on renal dialysis: Secondary | ICD-10-CM | POA: Diagnosis not present

## 2019-06-14 DIAGNOSIS — D509 Iron deficiency anemia, unspecified: Secondary | ICD-10-CM | POA: Diagnosis not present

## 2019-06-14 DIAGNOSIS — E875 Hyperkalemia: Secondary | ICD-10-CM | POA: Diagnosis not present

## 2019-06-14 DIAGNOSIS — L299 Pruritus, unspecified: Secondary | ICD-10-CM | POA: Diagnosis not present

## 2019-06-14 DIAGNOSIS — N186 End stage renal disease: Secondary | ICD-10-CM | POA: Diagnosis not present

## 2019-06-14 DIAGNOSIS — D689 Coagulation defect, unspecified: Secondary | ICD-10-CM | POA: Diagnosis not present

## 2019-06-16 DIAGNOSIS — L299 Pruritus, unspecified: Secondary | ICD-10-CM | POA: Diagnosis not present

## 2019-06-16 DIAGNOSIS — Z23 Encounter for immunization: Secondary | ICD-10-CM | POA: Diagnosis not present

## 2019-06-16 DIAGNOSIS — N186 End stage renal disease: Secondary | ICD-10-CM | POA: Diagnosis not present

## 2019-06-16 DIAGNOSIS — Z992 Dependence on renal dialysis: Secondary | ICD-10-CM | POA: Diagnosis not present

## 2019-06-16 DIAGNOSIS — D509 Iron deficiency anemia, unspecified: Secondary | ICD-10-CM | POA: Diagnosis not present

## 2019-06-16 DIAGNOSIS — D689 Coagulation defect, unspecified: Secondary | ICD-10-CM | POA: Diagnosis not present

## 2019-06-16 DIAGNOSIS — D631 Anemia in chronic kidney disease: Secondary | ICD-10-CM | POA: Diagnosis not present

## 2019-06-16 DIAGNOSIS — E875 Hyperkalemia: Secondary | ICD-10-CM | POA: Diagnosis not present

## 2019-06-16 DIAGNOSIS — E1121 Type 2 diabetes mellitus with diabetic nephropathy: Secondary | ICD-10-CM | POA: Diagnosis not present

## 2019-06-16 DIAGNOSIS — N2581 Secondary hyperparathyroidism of renal origin: Secondary | ICD-10-CM | POA: Diagnosis not present

## 2019-06-17 DIAGNOSIS — E1121 Type 2 diabetes mellitus with diabetic nephropathy: Secondary | ICD-10-CM | POA: Diagnosis not present

## 2019-06-17 DIAGNOSIS — E875 Hyperkalemia: Secondary | ICD-10-CM | POA: Diagnosis not present

## 2019-06-17 DIAGNOSIS — N2581 Secondary hyperparathyroidism of renal origin: Secondary | ICD-10-CM | POA: Diagnosis not present

## 2019-06-17 DIAGNOSIS — Z23 Encounter for immunization: Secondary | ICD-10-CM | POA: Diagnosis not present

## 2019-06-17 DIAGNOSIS — D631 Anemia in chronic kidney disease: Secondary | ICD-10-CM | POA: Diagnosis not present

## 2019-06-17 DIAGNOSIS — Z992 Dependence on renal dialysis: Secondary | ICD-10-CM | POA: Diagnosis not present

## 2019-06-17 DIAGNOSIS — N186 End stage renal disease: Secondary | ICD-10-CM | POA: Diagnosis not present

## 2019-06-17 DIAGNOSIS — D509 Iron deficiency anemia, unspecified: Secondary | ICD-10-CM | POA: Diagnosis not present

## 2019-06-17 DIAGNOSIS — L299 Pruritus, unspecified: Secondary | ICD-10-CM | POA: Diagnosis not present

## 2019-06-17 DIAGNOSIS — D689 Coagulation defect, unspecified: Secondary | ICD-10-CM | POA: Diagnosis not present

## 2019-06-19 ENCOUNTER — Other Ambulatory Visit: Payer: Self-pay | Admitting: *Deleted

## 2019-06-19 DIAGNOSIS — N186 End stage renal disease: Secondary | ICD-10-CM | POA: Diagnosis not present

## 2019-06-19 DIAGNOSIS — D689 Coagulation defect, unspecified: Secondary | ICD-10-CM | POA: Diagnosis not present

## 2019-06-19 DIAGNOSIS — Z23 Encounter for immunization: Secondary | ICD-10-CM | POA: Diagnosis not present

## 2019-06-19 DIAGNOSIS — D631 Anemia in chronic kidney disease: Secondary | ICD-10-CM | POA: Diagnosis not present

## 2019-06-19 DIAGNOSIS — E1121 Type 2 diabetes mellitus with diabetic nephropathy: Secondary | ICD-10-CM | POA: Diagnosis not present

## 2019-06-19 DIAGNOSIS — E1122 Type 2 diabetes mellitus with diabetic chronic kidney disease: Secondary | ICD-10-CM | POA: Diagnosis not present

## 2019-06-19 DIAGNOSIS — N2581 Secondary hyperparathyroidism of renal origin: Secondary | ICD-10-CM | POA: Diagnosis not present

## 2019-06-19 DIAGNOSIS — Z992 Dependence on renal dialysis: Secondary | ICD-10-CM | POA: Diagnosis not present

## 2019-06-19 DIAGNOSIS — D509 Iron deficiency anemia, unspecified: Secondary | ICD-10-CM | POA: Diagnosis not present

## 2019-06-19 DIAGNOSIS — E875 Hyperkalemia: Secondary | ICD-10-CM | POA: Diagnosis not present

## 2019-06-19 DIAGNOSIS — L299 Pruritus, unspecified: Secondary | ICD-10-CM | POA: Diagnosis not present

## 2019-06-19 NOTE — Patient Outreach (Signed)
[facility]) Care Management  06/19/2019  Michael Riggs May 03, 1971 [id number]   EMMI-General discharge RED ON EMMI ALERT Day # 1 Date:06/17/2019 Red Alert Reason: Pt has not read d/c papers/ No follow up appointment  Outreach #1 RN spoke with pt and verified the above information. Pt has indicated that he has read the discharge paperwork and has an appointment with his primary care provider today. Pt further inquired on DME for a hoyer lift and 3-1 commode that his Social Worker was suppose to manage however not available today. RN strongly encouraged pt to inquire further on the requested equipment and make a request for this on this office visit today. Pt is aware that he needs orders to the company of choice for delivery or pick up of the DME. Pt verbalized an understanding and will inquire further on his visit today.  PLAN: Above EMMI resolved with no additional needs. Will close this case.  Raina Mina, RN Care Management Coordinator [place] Office [phone]

## 2019-06-20 ENCOUNTER — Ambulatory Visit: Payer: Medicare Other | Admitting: Gastroenterology

## 2019-06-20 DIAGNOSIS — R269 Unspecified abnormalities of gait and mobility: Secondary | ICD-10-CM | POA: Diagnosis not present

## 2019-06-20 DIAGNOSIS — M6281 Muscle weakness (generalized): Secondary | ICD-10-CM | POA: Diagnosis not present

## 2019-06-20 DIAGNOSIS — Z89512 Acquired absence of left leg below knee: Secondary | ICD-10-CM | POA: Diagnosis not present

## 2019-06-20 DIAGNOSIS — G8929 Other chronic pain: Secondary | ICD-10-CM | POA: Diagnosis not present

## 2019-06-20 DIAGNOSIS — R262 Difficulty in walking, not elsewhere classified: Secondary | ICD-10-CM | POA: Diagnosis not present

## 2019-06-21 ENCOUNTER — Other Ambulatory Visit: Payer: Self-pay | Admitting: *Deleted

## 2019-06-21 DIAGNOSIS — Z23 Encounter for immunization: Secondary | ICD-10-CM | POA: Diagnosis not present

## 2019-06-21 DIAGNOSIS — E1121 Type 2 diabetes mellitus with diabetic nephropathy: Secondary | ICD-10-CM | POA: Diagnosis not present

## 2019-06-21 DIAGNOSIS — E118 Type 2 diabetes mellitus with unspecified complications: Secondary | ICD-10-CM | POA: Diagnosis not present

## 2019-06-21 DIAGNOSIS — E875 Hyperkalemia: Secondary | ICD-10-CM | POA: Diagnosis not present

## 2019-06-21 DIAGNOSIS — N186 End stage renal disease: Secondary | ICD-10-CM | POA: Diagnosis not present

## 2019-06-21 DIAGNOSIS — N179 Acute kidney failure, unspecified: Secondary | ICD-10-CM | POA: Diagnosis not present

## 2019-06-21 DIAGNOSIS — R52 Pain, unspecified: Secondary | ICD-10-CM | POA: Diagnosis not present

## 2019-06-21 DIAGNOSIS — D509 Iron deficiency anemia, unspecified: Secondary | ICD-10-CM | POA: Diagnosis not present

## 2019-06-21 DIAGNOSIS — D689 Coagulation defect, unspecified: Secondary | ICD-10-CM | POA: Diagnosis not present

## 2019-06-21 DIAGNOSIS — N2581 Secondary hyperparathyroidism of renal origin: Secondary | ICD-10-CM | POA: Diagnosis not present

## 2019-06-21 DIAGNOSIS — L299 Pruritus, unspecified: Secondary | ICD-10-CM | POA: Diagnosis not present

## 2019-06-21 DIAGNOSIS — Z992 Dependence on renal dialysis: Secondary | ICD-10-CM | POA: Diagnosis not present

## 2019-06-21 NOTE — Patient Outreach (Signed)
[facility]) Care Management  06/21/2019  Michael Riggs [birth year] [id number]    EMMI-General Discharge RED ON EMMI ALERT Day #4 Date:06/20/2019 Red Alert Reason:Loss of interest in doing things/questions and problems   Outreach #1 RN spoke with pt and inquired on the above EMMIs. Pt confirmed he has received all DME (Hoyer lift and 3-1 commode) ordered and no longer having any lost of interest. States he is currently receiving dialysis and does not have any needs at this time for RN to address. RN again verified no thoughts of depression or loss of interest and pt response "no I am fine".  Plan: Based upon the above information received case will be closed.  Raina Mina, RN Care Management Coordinator [place] Office [phone]

## 2019-06-22 ENCOUNTER — Ambulatory Visit (INDEPENDENT_AMBULATORY_CARE_PROVIDER_SITE_OTHER): Payer: Medicare Other | Admitting: Orthopedic Surgery

## 2019-06-22 ENCOUNTER — Encounter: Payer: Self-pay | Admitting: Orthopedic Surgery

## 2019-06-22 ENCOUNTER — Other Ambulatory Visit: Payer: Self-pay

## 2019-06-22 DIAGNOSIS — R234 Changes in skin texture: Secondary | ICD-10-CM | POA: Diagnosis not present

## 2019-06-22 DIAGNOSIS — E1151 Type 2 diabetes mellitus with diabetic peripheral angiopathy without gangrene: Secondary | ICD-10-CM | POA: Diagnosis not present

## 2019-06-22 DIAGNOSIS — Z992 Dependence on renal dialysis: Secondary | ICD-10-CM | POA: Diagnosis not present

## 2019-06-22 DIAGNOSIS — N186 End stage renal disease: Secondary | ICD-10-CM | POA: Diagnosis not present

## 2019-06-22 MED ORDER — COLLAGENASE 250 UNIT/GM EX OINT: 1.0000 "application " | TOPICAL_OINTMENT | Freq: Every day | CUTANEOUS | 3 refills | Status: DC

## 2019-06-22 NOTE — Progress Notes (Signed)
Office Visit Note   Patient: Michael Riggs           Date of Birth: 05-13-1971           MRN: [id number] Visit Date: 06/22/2019              Requested by: [name], MD [address] [place],  [place] [address] PCP: [name], MD  Chief Complaint  Patient presents with  . Left Leg - Follow-up    Hx BKA   . 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  Patient has shown remarkable improvement with his current care he states that before he left the hospital he was on Santyl dressing changes he states that this has not been continued.  Assessment & Plan: Visit Diagnoses:  1. Calciphylaxis   2. Diabetes mellitus with peripheral vascular disease ([place])   3. Eschar of multiple sites   4. ESRD on dialysis [facility])     Plan: A new prescription was called in for Santyl dressing changes he will wash his leg with soap and water and apply the Santyl to the eschar plus 4 x 4 plus Ace wrap.  Patient is showing excellent improvement and progression.  Follow-Up Instructions: Return in about 3 weeks (around 07/13/2019).   Ortho Exam  Patient is alert, oriented, no adenopathy, well-dressed, normal affect, normal respiratory effort. Examination patient has multiple eschars on both lower extremities several of the calciphylaxis wounds have healed.  After informed consent the eschar skin and soft tissue was debrided from multiple sites of both lower extremities.  Patient had good granulation tissue around the wound edges there is no purulence no cellulitis no signs of infection.  Iodosorb 4 x 4 and Ace wrap was applied to both lower extremities.  Patient currently cannot wear his prosthesis on the left due to the ulcerations on the left thigh.  Imaging: No results found. No images are attached to the encounter.  Labs: Lab Results  Component Value Date   HGBA1C 5.3  05/16/2019   HGBA1C 5.0 01/31/2019   ESRSEDRATE >140 (H) 03/28/2019   CRP 19.4 (H) 03/28/2019   REPTSTATUS 04/14/2019 FINAL 04/11/2019   GRAMSTAIN  04/11/2019    RARE WBC PRESENT,BOTH PMN AND MONONUCLEAR NO ORGANISMS SEEN    CULT  04/11/2019    RARE Consistent with normal respiratory flora. Performed at [facility], [place] [address], [place], [place] [address]    Doy Hutching ENTEROCOCCUS FAECIUM (A) 03/10/2019     Lab Results  Component Value Date   ALBUMIN 2.1 (L) 05/23/2019   ALBUMIN 2.1 (L) 05/22/2019   ALBUMIN 2.1 (L) 05/21/2019   PREALBUMIN 9.1 (L) 03/28/2019    Lab Results  Component Value Date   MG 1.8 05/18/2019   MG 1.8 04/12/2019   MG 1.8 04/11/2019   No results found for: [facility]  Lab Results  Component Value Date   PREALBUMIN 9.1 (L) 03/28/2019   CBC EXTENDED Latest Ref Rng & Units 05/23/2019 05/22/2019 05/22/2019  WBC 4.0 - 10.5 K/uL - 5.9 -  RBC 4.22 - 5.81 MIL/uL - 2.97(L) -  HGB 13.0 - 17.0 g/dL 9.0(L) 8.5(L) 8.5(L)  HCT 39.0 - 52.0 % 28.0(L) 27.1(L) 26.5(L)  PLT 150 - 400 K/uL - 323 -  NEUTROABS 1.7 - 7.7 K/uL - - -  LYMPHSABS 0.7 - 4.0 K/uL - - -  There is no height or weight on file to calculate BMI.  Orders:  No orders of the defined types were placed in this encounter.  Meds ordered this encounter  Medications  . collagenase (SANTYL) ointment    Sig: Apply 1 application topically daily. Apply to the affected area daily plus dry dressing    Dispense:  90 g    Refill:  3     Procedures: No procedures performed  Clinical Data: No additional findings.  ROS:  All other systems negative, except as noted in the HPI. Review of Systems  Objective: Vital Signs: There were no vitals taken for this visit.  Specialty Comments:  No specialty comments available.  PMFS History: Patient Active Problem List   Diagnosis Date Noted  . Lower GI bleed 05/14/2019  . Pressure injury of skin 04/27/2019  . Palliative care encounter   .  Goals of care, counseling/discussion   . Calciphylaxis   . Wound infection 03/28/2019  . Hypertension   . Dyslipidemia   . Diabetes mellitus with peripheral vascular disease ([place])   . Depression   . Physical deconditioning   . Pruritus   . Scrotal pain   . Labile blood pressure   . Scrotal edema   . Status post below-knee amputation of left lower extremity ([place])   . Slow transit constipation   . Leukocytosis   . Sleep disturbance   . Essential hypertension   . Anemia of chronic disease   . Controlled type 2 diabetes mellitus with hyperglycemia, without long-term current use of insulin ([place])   . ESRD on dialysis ([place])   . Urinary retention   . Debility 02/09/2019  . Fluid overload 02/04/2019  . Hyperkalemia 01/30/2019  . ARF (acute renal failure) ([place]) 01/30/2019  . Hypokalemia 01/30/2019  . Hypoglycemia 01/30/2019  . Syphilis 01/30/2019  . Macrocytic anemia 01/30/2019  . End stage renal disease ([place])   . ESRD (end stage renal disease) on dialysis ([place]) 01/2019  . PDR (proliferative diabetic retinopathy) ([place]) 12/14/2013  . Venous hypertension 09/22/2013  . Diabetes mellitus type 2 in obese ([place]) 07/04/2013  . Habitual alcohol use 07/04/2013  . Obesity, Class II, BMI 35-39.9, with comorbidity 07/04/2013   Past Medical History:  Diagnosis Date  . Anemia   . Depression   . Diabetes mellitus with peripheral vascular disease ([place])   . Dyslipidemia   . Dyspnea   . ESRD (end stage renal disease) on dialysis ([place]) 01/2019   MWF  . GERD (gastroesophageal reflux disease)   . Hypertension   . Physical deconditioning   . Syphilis 01/2019    History reviewed. No pertinent family history.  Past Surgical History:  Procedure Laterality Date  . AV FISTULA PLACEMENT Left 01/09/2019   Procedure: ARTERIOVENOUS (AV) FISTULA CREATION LEFT UPPER ARM;  Surgeon: [name], MD;  Location: [facility] OR;  Service: Vascular;  Laterality: Left;  . below the knee amputation Left   . EYE SURGERY     . FLEXIBLE SIGMOIDOSCOPY N/A 05/15/2019   Procedure: FLEXIBLE SIGMOIDOSCOPY;  Surgeon: Rush Landmark Telford Nab., MD;  Location: [place];  Service: Gastroenterology;  Laterality: N/A;  . HEMOSTASIS CLIP PLACEMENT  05/15/2019   Procedure: HEMOSTASIS CLIP PLACEMENT;  Surgeon: Irving Copas., MD;  Location: [place];  Service: Gastroenterology;;  . HOT HEMOSTASIS N/A 05/15/2019   Procedure: HOT HEMOSTASIS (ARGON PLASMA COAGULATION/BICAP);  Surgeon: Irving Copas., MD;  Location: [place];  Service: Gastroenterology;  Laterality: N/A;  . IR FLUORO GUIDE CV LINE  RIGHT  01/31/2019  . IR FLUORO GUIDE CV LINE RIGHT  02/03/2019  . IR US GUIDE VASC ACCESS RIGHT  01/31/2019  . IR US GUIDE VASC ACCESS RIGHT  02/03/2019  . SCLEROTHERAPY  05/15/2019   Procedure: Clide Deutscher;  Surgeon: Mansouraty, Telford Nab., MD;  Location: [facility];  Service: Gastroenterology;;   Social History   Occupational History  . Not on file  Tobacco Use  . Smoking status: Former Smoker    Types: Cigarettes    Start date: 11/18/2018    Quit date: 01/18/2019    Years since quitting: 0.4  . Smokeless tobacco: Never Used  . Tobacco comment: pt stated got bored with it  Substance and Sexual Activity  . Alcohol use: Not Currently    Comment: heavy drinker in the past, none since 07/19/18  . Drug use: Not Currently  . Sexual activity: Not Currently

## 2019-06-23 DIAGNOSIS — D689 Coagulation defect, unspecified: Secondary | ICD-10-CM | POA: Diagnosis not present

## 2019-06-23 DIAGNOSIS — D509 Iron deficiency anemia, unspecified: Secondary | ICD-10-CM | POA: Diagnosis not present

## 2019-06-23 DIAGNOSIS — L299 Pruritus, unspecified: Secondary | ICD-10-CM | POA: Diagnosis not present

## 2019-06-23 DIAGNOSIS — E1121 Type 2 diabetes mellitus with diabetic nephropathy: Secondary | ICD-10-CM | POA: Diagnosis not present

## 2019-06-23 DIAGNOSIS — E875 Hyperkalemia: Secondary | ICD-10-CM | POA: Diagnosis not present

## 2019-06-23 DIAGNOSIS — Z992 Dependence on renal dialysis: Secondary | ICD-10-CM | POA: Diagnosis not present

## 2019-06-23 DIAGNOSIS — N2581 Secondary hyperparathyroidism of renal origin: Secondary | ICD-10-CM | POA: Diagnosis not present

## 2019-06-23 DIAGNOSIS — Z23 Encounter for immunization: Secondary | ICD-10-CM | POA: Diagnosis not present

## 2019-06-23 DIAGNOSIS — N186 End stage renal disease: Secondary | ICD-10-CM | POA: Diagnosis not present

## 2019-06-23 DIAGNOSIS — R52 Pain, unspecified: Secondary | ICD-10-CM | POA: Diagnosis not present

## 2019-06-24 DIAGNOSIS — N2581 Secondary hyperparathyroidism of renal origin: Secondary | ICD-10-CM | POA: Diagnosis not present

## 2019-06-24 DIAGNOSIS — R52 Pain, unspecified: Secondary | ICD-10-CM | POA: Diagnosis not present

## 2019-06-24 DIAGNOSIS — D509 Iron deficiency anemia, unspecified: Secondary | ICD-10-CM | POA: Diagnosis not present

## 2019-06-24 DIAGNOSIS — D689 Coagulation defect, unspecified: Secondary | ICD-10-CM | POA: Diagnosis not present

## 2019-06-24 DIAGNOSIS — E875 Hyperkalemia: Secondary | ICD-10-CM | POA: Diagnosis not present

## 2019-06-24 DIAGNOSIS — Z23 Encounter for immunization: Secondary | ICD-10-CM | POA: Diagnosis not present

## 2019-06-24 DIAGNOSIS — N186 End stage renal disease: Secondary | ICD-10-CM | POA: Diagnosis not present

## 2019-06-24 DIAGNOSIS — E1121 Type 2 diabetes mellitus with diabetic nephropathy: Secondary | ICD-10-CM | POA: Diagnosis not present

## 2019-06-24 DIAGNOSIS — Z992 Dependence on renal dialysis: Secondary | ICD-10-CM | POA: Diagnosis not present

## 2019-06-24 DIAGNOSIS — L299 Pruritus, unspecified: Secondary | ICD-10-CM | POA: Diagnosis not present

## 2019-06-26 DIAGNOSIS — Z992 Dependence on renal dialysis: Secondary | ICD-10-CM | POA: Diagnosis not present

## 2019-06-26 DIAGNOSIS — E875 Hyperkalemia: Secondary | ICD-10-CM | POA: Diagnosis not present

## 2019-06-26 DIAGNOSIS — R52 Pain, unspecified: Secondary | ICD-10-CM | POA: Diagnosis not present

## 2019-06-26 DIAGNOSIS — E118 Type 2 diabetes mellitus with unspecified complications: Secondary | ICD-10-CM | POA: Diagnosis not present

## 2019-06-26 DIAGNOSIS — E1121 Type 2 diabetes mellitus with diabetic nephropathy: Secondary | ICD-10-CM | POA: Diagnosis not present

## 2019-06-26 DIAGNOSIS — D509 Iron deficiency anemia, unspecified: Secondary | ICD-10-CM | POA: Diagnosis not present

## 2019-06-26 DIAGNOSIS — N186 End stage renal disease: Secondary | ICD-10-CM | POA: Diagnosis not present

## 2019-06-26 DIAGNOSIS — Z23 Encounter for immunization: Secondary | ICD-10-CM | POA: Diagnosis not present

## 2019-06-26 DIAGNOSIS — M159 Polyosteoarthritis, unspecified: Secondary | ICD-10-CM | POA: Diagnosis not present

## 2019-06-26 DIAGNOSIS — R2681 Unsteadiness on feet: Secondary | ICD-10-CM | POA: Diagnosis not present

## 2019-06-26 DIAGNOSIS — G894 Chronic pain syndrome: Secondary | ICD-10-CM | POA: Diagnosis not present

## 2019-06-26 DIAGNOSIS — L299 Pruritus, unspecified: Secondary | ICD-10-CM | POA: Diagnosis not present

## 2019-06-26 DIAGNOSIS — N2581 Secondary hyperparathyroidism of renal origin: Secondary | ICD-10-CM | POA: Diagnosis not present

## 2019-06-26 DIAGNOSIS — D689 Coagulation defect, unspecified: Secondary | ICD-10-CM | POA: Diagnosis not present

## 2019-06-28 DIAGNOSIS — L299 Pruritus, unspecified: Secondary | ICD-10-CM | POA: Diagnosis not present

## 2019-06-28 DIAGNOSIS — R52 Pain, unspecified: Secondary | ICD-10-CM | POA: Diagnosis not present

## 2019-06-28 DIAGNOSIS — N186 End stage renal disease: Secondary | ICD-10-CM | POA: Diagnosis not present

## 2019-06-28 DIAGNOSIS — N2581 Secondary hyperparathyroidism of renal origin: Secondary | ICD-10-CM | POA: Diagnosis not present

## 2019-06-28 DIAGNOSIS — E875 Hyperkalemia: Secondary | ICD-10-CM | POA: Diagnosis not present

## 2019-06-28 DIAGNOSIS — D689 Coagulation defect, unspecified: Secondary | ICD-10-CM | POA: Diagnosis not present

## 2019-06-28 DIAGNOSIS — Z992 Dependence on renal dialysis: Secondary | ICD-10-CM | POA: Diagnosis not present

## 2019-06-28 DIAGNOSIS — E1121 Type 2 diabetes mellitus with diabetic nephropathy: Secondary | ICD-10-CM | POA: Diagnosis not present

## 2019-06-28 DIAGNOSIS — D509 Iron deficiency anemia, unspecified: Secondary | ICD-10-CM | POA: Diagnosis not present

## 2019-06-28 DIAGNOSIS — Z23 Encounter for immunization: Secondary | ICD-10-CM | POA: Diagnosis not present

## 2019-06-30 DIAGNOSIS — D509 Iron deficiency anemia, unspecified: Secondary | ICD-10-CM | POA: Diagnosis not present

## 2019-06-30 DIAGNOSIS — R52 Pain, unspecified: Secondary | ICD-10-CM | POA: Diagnosis not present

## 2019-06-30 DIAGNOSIS — D689 Coagulation defect, unspecified: Secondary | ICD-10-CM | POA: Diagnosis not present

## 2019-06-30 DIAGNOSIS — Z992 Dependence on renal dialysis: Secondary | ICD-10-CM | POA: Diagnosis not present

## 2019-06-30 DIAGNOSIS — N186 End stage renal disease: Secondary | ICD-10-CM | POA: Diagnosis not present

## 2019-06-30 DIAGNOSIS — Z23 Encounter for immunization: Secondary | ICD-10-CM | POA: Diagnosis not present

## 2019-06-30 DIAGNOSIS — N2581 Secondary hyperparathyroidism of renal origin: Secondary | ICD-10-CM | POA: Diagnosis not present

## 2019-06-30 DIAGNOSIS — E1121 Type 2 diabetes mellitus with diabetic nephropathy: Secondary | ICD-10-CM | POA: Diagnosis not present

## 2019-06-30 DIAGNOSIS — L299 Pruritus, unspecified: Secondary | ICD-10-CM | POA: Diagnosis not present

## 2019-06-30 DIAGNOSIS — E875 Hyperkalemia: Secondary | ICD-10-CM | POA: Diagnosis not present

## 2019-07-03 DIAGNOSIS — R52 Pain, unspecified: Secondary | ICD-10-CM | POA: Diagnosis not present

## 2019-07-03 DIAGNOSIS — Z23 Encounter for immunization: Secondary | ICD-10-CM | POA: Diagnosis not present

## 2019-07-03 DIAGNOSIS — E875 Hyperkalemia: Secondary | ICD-10-CM | POA: Diagnosis not present

## 2019-07-03 DIAGNOSIS — D509 Iron deficiency anemia, unspecified: Secondary | ICD-10-CM | POA: Diagnosis not present

## 2019-07-03 DIAGNOSIS — Z992 Dependence on renal dialysis: Secondary | ICD-10-CM | POA: Diagnosis not present

## 2019-07-03 DIAGNOSIS — L299 Pruritus, unspecified: Secondary | ICD-10-CM | POA: Diagnosis not present

## 2019-07-03 DIAGNOSIS — N2581 Secondary hyperparathyroidism of renal origin: Secondary | ICD-10-CM | POA: Diagnosis not present

## 2019-07-03 DIAGNOSIS — D689 Coagulation defect, unspecified: Secondary | ICD-10-CM | POA: Diagnosis not present

## 2019-07-03 DIAGNOSIS — E1121 Type 2 diabetes mellitus with diabetic nephropathy: Secondary | ICD-10-CM | POA: Diagnosis not present

## 2019-07-03 DIAGNOSIS — N186 End stage renal disease: Secondary | ICD-10-CM | POA: Diagnosis not present

## 2019-07-05 ENCOUNTER — Other Ambulatory Visit: Payer: Self-pay

## 2019-07-05 ENCOUNTER — Encounter (HOSPITAL_COMMUNITY): Payer: Self-pay | Admitting: Vascular Surgery

## 2019-07-05 DIAGNOSIS — Z992 Dependence on renal dialysis: Secondary | ICD-10-CM | POA: Diagnosis not present

## 2019-07-05 DIAGNOSIS — L299 Pruritus, unspecified: Secondary | ICD-10-CM | POA: Diagnosis not present

## 2019-07-05 DIAGNOSIS — N2581 Secondary hyperparathyroidism of renal origin: Secondary | ICD-10-CM | POA: Diagnosis not present

## 2019-07-05 DIAGNOSIS — E1121 Type 2 diabetes mellitus with diabetic nephropathy: Secondary | ICD-10-CM | POA: Diagnosis not present

## 2019-07-05 DIAGNOSIS — R52 Pain, unspecified: Secondary | ICD-10-CM | POA: Diagnosis not present

## 2019-07-05 DIAGNOSIS — D689 Coagulation defect, unspecified: Secondary | ICD-10-CM | POA: Diagnosis not present

## 2019-07-05 DIAGNOSIS — Z23 Encounter for immunization: Secondary | ICD-10-CM | POA: Diagnosis not present

## 2019-07-05 DIAGNOSIS — N186 End stage renal disease: Secondary | ICD-10-CM | POA: Diagnosis not present

## 2019-07-05 DIAGNOSIS — D509 Iron deficiency anemia, unspecified: Secondary | ICD-10-CM | POA: Diagnosis not present

## 2019-07-05 DIAGNOSIS — E875 Hyperkalemia: Secondary | ICD-10-CM | POA: Diagnosis not present

## 2019-07-05 NOTE — Anesthesia Preprocedure Evaluation (Addendum)
Anesthesia Evaluation  Patient identified by MRN, date of birth, ID band Patient awake    Reviewed: Allergy & Precautions, NPO status , Patient's Chart, lab work & pertinent test results  Airway Mallampati: III  TM Distance: >3 FB Neck ROM: Full    Dental no notable dental hx.    Pulmonary asthma , sleep apnea and Continuous Positive Airway Pressure Ventilation , former smoker,    Pulmonary exam normal breath sounds clear to auscultation       Cardiovascular hypertension, Pt. on medications + Peripheral Vascular Disease  Normal cardiovascular exam Rhythm:Regular Rate:Normal  ECG: SR, rate 91  ECHO: 1. Left ventricular ejection fraction, by visual estimation, is 55 to 60%. The left ventricle has normal function. Mildly increased left ventricular size. There is no left ventricular hypertrophy.  2. Global right ventricle has normal systolic function.The right ventricular size is mildly enlarged. No increase in right ventricular wall thickness.  3. Left atrial size was normal.  4. Right atrial size was mildly dilated.  5. Trivial pericardial effusion is present.  6. The mitral valve is normal in structure. No evidence of mitral valve regurgitation. No evidence of mitral stenosis.  7. The tricuspid valve is normal in structure. Tricuspid valve regurgitation is trivial.  8. The aortic valve is normal in structure. Aortic valve regurgitation was not visualized by color flow Doppler. Structurally normal aortic valve, with no evidence of sclerosis or stenosis.  9. The pulmonic valve was normal in structure. Pulmonic valve regurgitation is not visualized by color flow Doppler. 10. Mildly elevated pulmonary artery systolic pressure (estimated systolic PAP 36 mm Hg). 11. The inferior vena cava is dilated in size with <50% respiratory variability, suggesting right atrial pressure of 15 mmHg. 12. IVC dilation is a nonspecific finding if the  patient is receiving positive pressure ventilation.   Neuro/Psych  Headaches, PSYCHIATRIC DISORDERS Anxiety Depression TIACVA, No Residual Symptoms    GI/Hepatic GERD  Medicated and Controlled,(+)     substance abuse  ,   Endo/Other  diabetes  Renal/GU ESRF and DialysisRenal disease     Musculoskeletal negative musculoskeletal ROS (+)   Abdominal (+) + obese,   Peds  Hematology   Anesthesia Other Findings end stage renal disease  Reproductive/Obstetrics                          Anesthesia Physical Anesthesia Plan  ASA: IV  Anesthesia Plan: General   Post-op Pain Management:    Induction: Intravenous  PONV Risk Score and Plan: 2 and Ondansetron, Dexamethasone and Treatment may vary due to age or medical condition  Airway Management Planned: LMA  Additional Equipment:   Intra-op Plan:   Post-operative Plan: Extubation in OR  Informed Consent: I have reviewed the patients History and Physical, chart, labs and discussed the procedure including the risks, benefits and alternatives for the proposed anesthesia with the patient or authorized representative who has indicated his/her understanding and acceptance.     Dental advisory given  Plan Discussed with: CRNA and Surgeon  Anesthesia Plan Comments: (TTE 04/11/19:  1. Left ventricular ejection fraction, by visual estimation, is 55 to 60%. The left ventricle has normal function. Mildly increased left ventricular size. There is no left ventricular hypertrophy.  2. Global right ventricle has normal systolic function.The right ventricular size is mildly enlarged. No increase in right ventricular wall thickness.  3. Left atrial size was normal.  4. Right atrial size was mildly dilated.  5. Trivial  pericardial effusion is present.  6. The mitral valve is normal in structure. No evidence of mitral valve regurgitation. No evidence of mitral stenosis.  7. The tricuspid valve is normal in structure.  Tricuspid valve regurgitation is trivial.  8. The aortic valve is normal in structure. Aortic valve regurgitation was not visualized by color flow Doppler. Structurally normal aortic valve, with no evidence of sclerosis or stenosis.  9. The pulmonic valve was normal in structure. Pulmonic valve regurgitation is not visualized by color flow Doppler. 10. Mildly elevated pulmonary artery systolic pressure (estimated systolic PAP 36 mm Hg). 11. The inferior vena cava is dilated in size with <50% respiratory variability, suggesting right atrial pressure of 15 mmHg. 12. IVC dilation is a nonspecific finding if the patient is receiving positive pressure ventilation.)       Anesthesia Quick Evaluation

## 2019-07-05 NOTE — Progress Notes (Signed)
Patient denies shortness of breath, fever, cough and chest pain.  PCP - Dr Truman Hayward Cardiologist - denies  Chest x-ray - 04/13/19, 1 view EKG - 05/22/19 Stress Test - denies ECHO - 04/11/19 Cardiac Cath - Denies  Fasting Blood Sugar - unknown Checks Blood Sugar ___0 __ times a day  Patient does not  Check blood sugar, nodiabetes medications, lost 100 lbs per patient.  OSA - uses CPAP nightly.  Anesthesia review: Yes  STOP now taking any Aspirin (unless otherwise instructed by your surgeon), Aleve, Naproxen, Ibuprofen, Motrin, Advil, Goody's, BC's, all herbal medications, fish oil, and all vitamins.    Coronavirus Screening Have you experienced the following symptoms:  Cough yes/no: No Fever (>100.68F)  yes/no: No Runny nose yes/no: No Sore throat yes/no: No Difficulty breathing/shortness of breath  yes/no: No  Have you traveled in the last 14 days and where? yes/no: No  Patient verbalized understanding of instructions that were given via phone.

## 2019-07-06 ENCOUNTER — Ambulatory Visit (HOSPITAL_COMMUNITY)
Admission: RE | Admit: 2019-07-06 | Discharge: 2019-07-06 | Disposition: A | Discharge status: Home / Self Care | Payer: Medicare Other | Attending: Vascular Surgery | Admitting: Vascular Surgery

## 2019-07-06 ENCOUNTER — Other Ambulatory Visit: Payer: Self-pay

## 2019-07-06 ENCOUNTER — Ambulatory Visit (HOSPITAL_COMMUNITY): Payer: Medicare Other | Admitting: Certified Registered Nurse Anesthetist

## 2019-07-06 ENCOUNTER — Encounter (HOSPITAL_COMMUNITY)
Admission: RE | Disposition: A | Discharge status: Home / Self Care | Payer: Self-pay | Source: Home / Self Care | Attending: Vascular Surgery

## 2019-07-06 ENCOUNTER — Encounter (HOSPITAL_COMMUNITY): Payer: Self-pay | Admitting: Vascular Surgery

## 2019-07-06 ENCOUNTER — Other Ambulatory Visit: Payer: Self-pay | Admitting: Vascular Surgery

## 2019-07-06 DIAGNOSIS — N186 End stage renal disease: Secondary | ICD-10-CM | POA: Diagnosis not present

## 2019-07-06 DIAGNOSIS — Z452 Encounter for adjustment and management of vascular access device: Secondary | ICD-10-CM | POA: Diagnosis not present

## 2019-07-06 DIAGNOSIS — Z87891 Personal history of nicotine dependence: Secondary | ICD-10-CM | POA: Insufficient documentation

## 2019-07-06 DIAGNOSIS — E1151 Type 2 diabetes mellitus with diabetic peripheral angiopathy without gangrene: Secondary | ICD-10-CM | POA: Diagnosis not present

## 2019-07-06 DIAGNOSIS — N185 Chronic kidney disease, stage 5: Secondary | ICD-10-CM | POA: Diagnosis not present

## 2019-07-06 DIAGNOSIS — I120 Hypertensive chronic kidney disease with stage 5 chronic kidney disease or end stage renal disease: Secondary | ICD-10-CM | POA: Diagnosis not present

## 2019-07-06 DIAGNOSIS — Z20828 Contact with and (suspected) exposure to other viral communicable diseases: Secondary | ICD-10-CM | POA: Diagnosis not present

## 2019-07-06 DIAGNOSIS — E1122 Type 2 diabetes mellitus with diabetic chronic kidney disease: Secondary | ICD-10-CM | POA: Diagnosis not present

## 2019-07-06 DIAGNOSIS — Z992 Dependence on renal dialysis: Secondary | ICD-10-CM | POA: Insufficient documentation

## 2019-07-06 HISTORY — DX: Headache, unspecified: R51.9

## 2019-07-06 HISTORY — DX: Cerebral infarction, unspecified: I63.9

## 2019-07-06 HISTORY — DX: Sleep apnea, unspecified: G47.30

## 2019-07-06 HISTORY — PX: BASCILIC VEIN TRANSPOSITION: SHX5742

## 2019-07-06 HISTORY — DX: Anxiety disorder, unspecified: F41.9

## 2019-07-06 LAB — POCT I-STAT, CHEM 8
BUN: 30 mg/dL — ABNORMAL HIGH (ref 6–20)
Calcium, Ion: 1.08 mmol/L — ABNORMAL LOW (ref 1.15–1.40)
Chloride: 98 mmol/L (ref 98–111)
Creatinine, Ser: 5.6 mg/dL — ABNORMAL HIGH (ref 0.61–1.24)
Glucose, Bld: 109 mg/dL — ABNORMAL HIGH (ref 70–99)
HCT: 41 % (ref 39.0–52.0)
Hemoglobin: 13.9 g/dL (ref 13.0–17.0)
Potassium: 4 mmol/L (ref 3.5–5.1)
Sodium: 134 mmol/L — ABNORMAL LOW (ref 135–145)
TCO2: 27 mmol/L (ref 22–32)

## 2019-07-06 LAB — RESPIRATORY PANEL BY RT PCR (FLU A&B, COVID)
Influenza A by PCR: NEGATIVE
Influenza B by PCR: NEGATIVE
SARS Coronavirus 2 by RT PCR: NEGATIVE

## 2019-07-06 LAB — GLUCOSE, CAPILLARY
Glucose-Capillary: 99 mg/dL (ref 70–99)
Glucose-Capillary: 110 mg/dL — ABNORMAL HIGH (ref 70–99)

## 2019-07-06 SURGERY — TRANSPOSITION, VEIN, BASILIC
Anesthesia: General | Laterality: Left

## 2019-07-06 MED ORDER — FENTANYL CITRATE (PF) 250 MCG/5ML IJ SOLN
INTRAMUSCULAR | Status: AC
  Filled 2019-07-06: qty 5

## 2019-07-06 MED ORDER — PROPOFOL 10 MG/ML IV BOLUS
INTRAVENOUS | Status: AC
  Filled 2019-07-06: qty 20

## 2019-07-06 MED ORDER — PHENYLEPHRINE 40 MCG/ML (10ML) SYRINGE FOR IV PUSH (FOR BLOOD PRESSURE SUPPORT)
PREFILLED_SYRINGE | INTRAVENOUS | Status: AC
  Filled 2019-07-06: qty 10

## 2019-07-06 MED ORDER — LIDOCAINE 2% (20 MG/ML) 5 ML SYRINGE
INTRAMUSCULAR | Status: AC
  Filled 2019-07-06: qty 5

## 2019-07-06 MED ORDER — PROPOFOL 10 MG/ML IV BOLUS
INTRAVENOUS | Status: DC | PRN
  Administered 2019-07-06: 200 mg via INTRAVENOUS

## 2019-07-06 MED ORDER — ACETAMINOPHEN 500 MG PO TABS: 500.0000 mg | ORAL_TABLET | Freq: Once | ORAL | Status: AC

## 2019-07-06 MED ORDER — FENTANYL CITRATE (PF) 100 MCG/2ML IJ SOLN
25.0000 ug | INTRAMUSCULAR | Status: DC | PRN
  Administered 2019-07-06 (×4): 25 ug via INTRAVENOUS

## 2019-07-06 MED ORDER — FENTANYL CITRATE (PF) 100 MCG/2ML IJ SOLN
INTRAMUSCULAR | Status: AC
  Filled 2019-07-06: qty 2

## 2019-07-06 MED ORDER — ACETAMINOPHEN 500 MG PO TABS
ORAL_TABLET | ORAL | Status: AC
  Administered 2019-07-06: 09:00:00 500 mg via ORAL
  Filled 2019-07-06: qty 1

## 2019-07-06 MED ORDER — CEFAZOLIN SODIUM-DEXTROSE 2-4 GM/100ML-% IV SOLN
2.0000 g | INTRAVENOUS | Status: AC
  Administered 2019-07-06: 2 g via INTRAVENOUS

## 2019-07-06 MED ORDER — OXYCODONE HCL 5 MG PO CAPS: 5.0000 mg | ORAL_CAPSULE | Freq: Four times a day (QID) | ORAL | 0 refills | Status: DC | PRN

## 2019-07-06 MED ORDER — LIDOCAINE-EPINEPHRINE 0.5 %-1:200000 IJ SOLN
INTRAMUSCULAR | Status: AC
  Filled 2019-07-06: qty 1

## 2019-07-06 MED ORDER — LACTATED RINGERS IV SOLN: INTRAVENOUS | Status: DC | PRN

## 2019-07-06 MED ORDER — FENTANYL CITRATE (PF) 100 MCG/2ML IJ SOLN: 25.0000 ug | INTRAMUSCULAR | Status: DC | PRN

## 2019-07-06 MED ORDER — PHENYLEPHRINE HCL (PRESSORS) 10 MG/ML IV SOLN
INTRAVENOUS | Status: DC | PRN
  Administered 2019-07-06 (×2): 80 ug via INTRAVENOUS

## 2019-07-06 MED ORDER — CEFAZOLIN SODIUM-DEXTROSE 2-4 GM/100ML-% IV SOLN
INTRAVENOUS | Status: AC
  Filled 2019-07-06: qty 100

## 2019-07-06 MED ORDER — DEXAMETHASONE SODIUM PHOSPHATE 10 MG/ML IJ SOLN
INTRAMUSCULAR | Status: DC | PRN
  Administered 2019-07-06: 4 mg via INTRAVENOUS

## 2019-07-06 MED ORDER — 0.9 % SODIUM CHLORIDE (POUR BTL) OPTIME
TOPICAL | Status: DC | PRN
  Administered 2019-07-06: 11:00:00 1000 mL

## 2019-07-06 MED ORDER — MIDAZOLAM HCL 2 MG/2ML IJ SOLN
INTRAMUSCULAR | Status: AC
  Filled 2019-07-06: qty 2

## 2019-07-06 MED ORDER — LIDOCAINE HCL (CARDIAC) PF 100 MG/5ML IV SOSY
PREFILLED_SYRINGE | INTRAVENOUS | Status: DC | PRN
  Administered 2019-07-06: 60 mg via INTRATRACHEAL

## 2019-07-06 MED ORDER — OXYCODONE-ACETAMINOPHEN 5-325 MG PO TABS: 1.0000 | ORAL_TABLET | ORAL | 0 refills | Status: AC | PRN

## 2019-07-06 MED ORDER — ONDANSETRON HCL 4 MG/2ML IJ SOLN
INTRAMUSCULAR | Status: AC
  Filled 2019-07-06: qty 2

## 2019-07-06 MED ORDER — MIDAZOLAM HCL 2 MG/2ML IJ SOLN
INTRAMUSCULAR | Status: DC | PRN
  Administered 2019-07-06: 2 mg via INTRAVENOUS

## 2019-07-06 MED ORDER — DEXAMETHASONE SODIUM PHOSPHATE 10 MG/ML IJ SOLN
INTRAMUSCULAR | Status: AC
  Filled 2019-07-06: qty 1

## 2019-07-06 MED ORDER — SODIUM CHLORIDE 0.9 % IV SOLN: INTRAVENOUS | Status: DC

## 2019-07-06 MED ORDER — SODIUM CHLORIDE 0.9 % IV SOLN
INTRAVENOUS | Status: DC | PRN
  Administered 2019-07-06: 50 ug/min via INTRAVENOUS

## 2019-07-06 MED ORDER — ONDANSETRON HCL 4 MG/2ML IJ SOLN: 4.0000 mg | Freq: Once | INTRAMUSCULAR | Status: DC | PRN

## 2019-07-06 MED ORDER — SODIUM CHLORIDE 0.9 % IV SOLN
INTRAVENOUS | Status: AC
  Filled 2019-07-06: qty 1.2

## 2019-07-06 MED ORDER — FENTANYL CITRATE (PF) 250 MCG/5ML IJ SOLN
INTRAMUSCULAR | Status: DC | PRN
  Administered 2019-07-06 (×6): 25 ug via INTRAVENOUS

## 2019-07-06 MED ORDER — ONDANSETRON HCL 4 MG/2ML IJ SOLN
INTRAMUSCULAR | Status: DC | PRN
  Administered 2019-07-06: 4 mg via INTRAVENOUS

## 2019-07-06 MED ORDER — SODIUM CHLORIDE 0.9 % IV SOLN
INTRAVENOUS | Status: DC | PRN
  Administered 2019-07-06: 500 mL

## 2019-07-06 SURGICAL SUPPLY — 30 items
ARMBAND PINK RESTRICT EXTREMIT (MISCELLANEOUS) ×2 IMPLANT
CANISTER SUCT 3000ML PPV (MISCELLANEOUS) ×2 IMPLANT
CANNULA VESSEL 3MM 2 BLNT TIP (CANNULA) ×2 IMPLANT
CLIP LIGATING EXTRA MED SLVR (CLIP) ×2 IMPLANT
CLIP LIGATING EXTRA SM BLUE (MISCELLANEOUS) ×2 IMPLANT
COVER PROBE W GEL 5X96 (DRAPES) ×2 IMPLANT
COVER WAND RF STERILE (DRAPES) ×2 IMPLANT
DECANTER SPIKE VIAL GLASS SM (MISCELLANEOUS) ×2 IMPLANT
DERMABOND ADVANCED (GAUZE/BANDAGES/DRESSINGS) ×1
DERMABOND ADVANCED .7 DNX12 (GAUZE/BANDAGES/DRESSINGS) ×1 IMPLANT
ELECT REM PT RETURN 9FT ADLT (ELECTROSURGICAL) ×2
ELECTRODE REM PT RTRN 9FT ADLT (ELECTROSURGICAL) ×1 IMPLANT
GLOVE SS BIOGEL STRL SZ 7.5 (GLOVE) ×1 IMPLANT
GLOVE SUPERSENSE BIOGEL SZ 7.5 (GLOVE) ×1
GOWN STRL REUS W/ TWL LRG LVL3 (GOWN DISPOSABLE) ×3 IMPLANT
GOWN STRL REUS W/TWL LRG LVL3 (GOWN DISPOSABLE) ×3
KIT BASIN OR (CUSTOM PROCEDURE TRAY) ×2 IMPLANT
KIT TURNOVER KIT B (KITS) ×2 IMPLANT
NS IRRIG 1000ML POUR BTL (IV SOLUTION) ×2 IMPLANT
PACK CV ACCESS (CUSTOM PROCEDURE TRAY) ×2 IMPLANT
PAD ARMBOARD 7.5X6 YLW CONV (MISCELLANEOUS) ×4 IMPLANT
SUT PROLENE 6 0 CC (SUTURE) ×2 IMPLANT
SUT SILK 2 0 SH (SUTURE) IMPLANT
SUT SILK 3 0 (SUTURE) ×1
SUT SILK 3-0 18XBRD TIE 12 (SUTURE) IMPLANT
SUT VIC AB 3-0 SH 27 (SUTURE) ×3
SUT VIC AB 3-0 SH 27X BRD (SUTURE) ×1 IMPLANT
TOWEL GREEN STERILE (TOWEL DISPOSABLE) ×2 IMPLANT
UNDERPAD 30X30 (UNDERPADS AND DIAPERS) ×2 IMPLANT
WATER STERILE IRR 1000ML POUR (IV SOLUTION) ×2 IMPLANT

## 2019-07-06 NOTE — Op Note (Signed)
    OPERATIVE REPORT  DATE OF SURGERY: 07/06/2019  PATIENT: Michael Riggs, 48 y.o. male MRN: [id number]  DOB: May 31, 1971  PRE-OPERATIVE DIAGNOSIS: End-stage renal disease  POST-OPERATIVE DIAGNOSIS:  Same  PROCEDURE: Left second stage basilic vein transposition fistula  SURGEON:  [name], M.D.  PHYSICIAN ASSISTANT: Matt Eveland, PA-C  ANESTHESIA: General  EBL: per anesthesia record  Total I/O In: 500 [I.V.:500] Out: -   BLOOD ADMINISTERED: none  DRAINS: none  SPECIMEN: none  COUNTS CORRECT:  YES  PATIENT DISPOSITION:  PACU - hemodynamically stable  PROCEDURE DETAILS: Patient was taken up and placed to position where the area of the left arm left axilla were prepped draped you sterile fashion.  SonoSite ultrasound was used to visualize the basilic vein from the antecubital space to the axilla and this was marked.  The old incision at the antecubital space was reopened and the basilic vein to brachial artery anastomosis was encircled with Vesseloops.  3 separate incision was made over the medial aspect of the upper arm and the basilic vein was mobilized from the antecubital space to the axilla.  There is any was a very good caliber.  Tributary branches were ligated with 3-0 silk ties and divided.  The vein was marked to reduce risk of twisting.  The vein was occluded near the brachial artery anastomosis.  Vein was transected and brought out through the native tunnel.  A subcuticular tunnel was created from the antecubital space to the axilla and the vein was brought back through this tunnel.  The basilic vein was sewn into into itself near the brachial artery anastomosis.  The vein was spatulated proximally and distally.  There was sewn end-to-end with a running 6-0 Prolene suture.  Clamps removed and excellent thrill was noted.  The wounds irrigated with saline.  Hemostasis electrocautery.  Wounds were closed with 3-0 Vicryl in the subcutaneous and subcuticular tissue.   Sterile dressing was applied and the patient was transferred to the recovery room in stable condition   [name], M.D., [facility] 07/06/2019 12:19 PM

## 2019-07-06 NOTE — Anesthesia Postprocedure Evaluation (Signed)
Anesthesia Post Note  Patient: Michael Riggs  Procedure(s) Performed: BASILIC VEIN TRANSPOSITION SECOND STAGE LEFT ARM (Left )     Patient location during evaluation: PACU Anesthesia Type: General Level of consciousness: awake Pain management: pain level controlled Vital Signs Assessment: post-procedure vital signs reviewed and stable Respiratory status: spontaneous breathing, nonlabored ventilation, respiratory function stable and patient connected to nasal cannula oxygen Cardiovascular status: blood pressure returned to baseline and stable Postop Assessment: no apparent nausea or vomiting Anesthetic complications: no    Last Vitals:  Vitals:   07/06/19 1334 07/06/19 1345  BP:  136/89  Pulse: 87 89  Resp: 15 16  Temp:    SpO2: 99% 99%    Last Pain:  Vitals:   07/06/19 1300  PainSc: 9                  Adrienne Delay P Latandra Loureiro

## 2019-07-06 NOTE — Anesthesia Procedure Notes (Signed)
Procedure Name: LMA Insertion Date/Time: 07/06/2019 10:37 AM Performed by: Kathryne Hitch, CRNA Pre-anesthesia Checklist: Patient identified, Emergency Drugs available, Suction available and Patient being monitored Patient Re-evaluated:Patient Re-evaluated prior to induction Oxygen Delivery Method: Circle system utilized Preoxygenation: Pre-oxygenation with 100% oxygen Induction Type: IV induction LMA: LMA inserted LMA Size: 5.0 Number of attempts: 1 Tube secured with: Tape Dental Injury: Teeth and Oropharynx as per pre-operative assessment

## 2019-07-06 NOTE — Discharge Instructions (Signed)
° °  [facility] ° °Discharge Instructions ° °AV Fistula or Graft Surgery for Dialysis Access ° °Please refer to the following instructions for your post-procedure care. Your surgeon or physician assistant will discuss any changes with you. ° °Activity ° °You may drive the day following your surgery, if you are comfortable and no longer taking prescription pain medication. Resume full activity as the soreness in your incision resolves. ° °Bathing/Showering ° °You may shower after you go home. Keep your incision dry for 48 hours. Do not soak in a bathtub, hot tub, or swim until the incision heals completely. You may not shower if you have a hemodialysis catheter. ° °Incision Care ° °Clean your incision with mild soap and water after 48 hours. Pat the area dry with a clean towel. You do not need a bandage unless otherwise instructed. Do not apply any ointments or creams to your incision. You may have skin glue on your incision. Do not peel it off. It will come off on its own in about one week. Your arm may swell a bit after surgery. To reduce swelling use pillows to elevate your arm so it is above your heart. Your doctor will tell you if you need to lightly wrap your arm with an ACE bandage. ° °Diet ° °Resume your normal diet. There are not special food restrictions following this procedure. In order to heal from your surgery, it is CRITICAL to get adequate nutrition. Your body requires vitamins, minerals, and protein. Vegetables are the best source of vitamins and minerals. Vegetables also provide the perfect balance of protein. Processed food has little nutritional value, so try to avoid this. ° °Medications ° °Resume taking all of your medications. If your incision is causing pain, you may take over-the counter pain relievers such as acetaminophen (Tylenol). If you were prescribed a stronger pain medication, please be aware these medications can cause nausea and constipation. Prevent  nausea by taking the medication with a snack or meal. Avoid constipation by drinking plenty of fluids and eating foods with high amount of fiber, such as fruits, vegetables, and grains. Do not take Tylenol if you are taking prescription pain medications. ° ° ° ° °Follow up °Your surgeon may want to see you in the office following your access surgery. If so, this will be arranged at the time of your surgery. ° °Please call us immediately for any of the following conditions: ° °Increased pain, redness, drainage (pus) from your incision site °Fever of 101 degrees or higher °Severe or worsening pain at your incision site °Hand pain or numbness. ° °Reduce your risk of vascular disease: ° °Stop smoking. If you would like help, call [facility] at 1-800-QUIT-NOW (1-800-784-8669) or [facility] at 336-586-4000 ° °Manage your cholesterol °Maintain a desired weight °Control your diabetes °Keep your blood pressure down ° °Dialysis ° °It will take several weeks to several months for your new dialysis access to be ready for use. Your surgeon will determine when it is OK to use it. Your nephrologist will continue to direct your dialysis. You can continue to use your Permcath until your new access is ready for use. ° °If you have any questions, please call the office at [phone]. ° °

## 2019-07-06 NOTE — H&P (Signed)
    Michael Posner, MD  Physician  Vascular Surgery  Progress Notes  Addendum  Date of Service:  02/08/2019 9:58 AM          Expand AllCollapse All    Show:Clear all [x] Manual[x] Template[] Copied  Added by: [x] Ulyses Amor, PA-C[x] Tiny Chaudhary, Arvilla Meres, MD  [] Hover for details [facility]  HPI: 48 y/o male with ESRD s/p left radio basilic fistula creation 6/ 22/20.    Subjective  - He denise symptoms of steal without weakness, loss of sensation or pain.      Objective 110/69 82 99.6 F (37.6 C) (Oral) 18 95%  Intake/Output Summary (Last 24 hours) at 02/08/2019 0958 Last data filed at 02/08/2019 0900    Gross per 24 hour  Intake 810 ml  Output 4000 ml  Net -3190 ml    Palpable Left radial pulse and palpable thrill in fistula Left UE motor and sensation intact Gen NAD Lungs with mild labored breathing when talking  Assessment/Planning: S/P fistula stage basilic fistula creations. We will order a fistula duplex and plan seconds stage basilic procedure when he has stabilization of his COPD/respiratory issues possibly this hospitalization.    Michael Riggs 02/08/2019 9:58 AM --  Laboratory Lab Results: Recent Labs (last 2 labs)       Recent Labs    02/07/19 0034 02/08/19 0622  WBC 14.8* 14.0*  HGB 7.0* 7.0*  HCT 22.9* 22.8*  PLT 120* 138*     BMET Recent Labs (last 2 labs)       Recent Labs    02/07/19 0034 02/08/19 0622  NA 129* 130*  K 3.9 4.0  CL 96* 97*  CO2 24 24  GLUCOSE 168* 137*  BUN 27* 22*  CREATININE 5.20* 4.48*  CALCIUM 6.8* 7.1*      COAG Recent Labs       Lab Results  Component Value Date   INR 1.4 (H) 01/31/2019     Recent Labs  No results found for: PTT    I have examined the patient, reviewed and agree with above.  The patient was to see me as an outpatient yesterday in the office but had been admitted with volume overload.  He does have an excellent  thrill in his brachiobasilic fistula.  Will obtain duplex of the fistula.  Will need second stage basilic vein transposition.  Can be done as an outpatient or scheduled while inpatient as he stabilized.  Will follow with you.  Michael Jews, MD 02/08/2019 10:23 AM      Addendum:  The patient has been re-examined and re-evaluated.  The patient's history and physical has been reviewed and is unchanged.    Michael Riggs is a 48 y.o. male is being admitted with end stage renal disease. All the risks, benefits and other treatment options have been discussed with the patient. The patient has consented to proceed with Procedure(s): BASILIC VEIN TRANSPOSITION SECOND STAGE LEFT ARM as a surgical intervention.  Michael Riggs 07/06/2019 9:24 AM Vascular and Vein Surgery

## 2019-07-06 NOTE — Transfer of Care (Signed)
Immediate Anesthesia Transfer of Care Note  Patient: Michael Riggs  Procedure(s) Performed: BASILIC VEIN TRANSPOSITION SECOND STAGE LEFT ARM (Left )  Patient Location: PACU  Anesthesia Type:General  Level of Consciousness: drowsy and patient cooperative  Airway & Oxygen Therapy: Patient Spontanous Breathing and Patient connected to face mask oxygen  Post-op Assessment: Report given to RN and Post -op Vital signs reviewed and stable  Post vital signs: Reviewed and stable  Last Vitals:  Vitals Value Taken Time  BP 137/91 07/06/19 1233  Temp    Pulse 85 07/06/19 1234  Resp 18 07/06/19 1234  SpO2 100 % 07/06/19 1234    Last Pain:  Vitals:   07/06/19 0855  PainSc: 7          Complications: No apparent anesthesia complications

## 2019-07-07 DIAGNOSIS — N2581 Secondary hyperparathyroidism of renal origin: Secondary | ICD-10-CM | POA: Diagnosis not present

## 2019-07-07 DIAGNOSIS — L299 Pruritus, unspecified: Secondary | ICD-10-CM | POA: Diagnosis not present

## 2019-07-07 DIAGNOSIS — N186 End stage renal disease: Secondary | ICD-10-CM | POA: Diagnosis not present

## 2019-07-07 DIAGNOSIS — Z23 Encounter for immunization: Secondary | ICD-10-CM | POA: Diagnosis not present

## 2019-07-07 DIAGNOSIS — D509 Iron deficiency anemia, unspecified: Secondary | ICD-10-CM | POA: Diagnosis not present

## 2019-07-07 DIAGNOSIS — R52 Pain, unspecified: Secondary | ICD-10-CM | POA: Diagnosis not present

## 2019-07-07 DIAGNOSIS — E1121 Type 2 diabetes mellitus with diabetic nephropathy: Secondary | ICD-10-CM | POA: Diagnosis not present

## 2019-07-07 DIAGNOSIS — D689 Coagulation defect, unspecified: Secondary | ICD-10-CM | POA: Diagnosis not present

## 2019-07-07 DIAGNOSIS — E875 Hyperkalemia: Secondary | ICD-10-CM | POA: Diagnosis not present

## 2019-07-07 DIAGNOSIS — Z992 Dependence on renal dialysis: Secondary | ICD-10-CM | POA: Diagnosis not present

## 2019-07-08 DIAGNOSIS — N2581 Secondary hyperparathyroidism of renal origin: Secondary | ICD-10-CM | POA: Diagnosis not present

## 2019-07-08 DIAGNOSIS — L299 Pruritus, unspecified: Secondary | ICD-10-CM | POA: Diagnosis not present

## 2019-07-08 DIAGNOSIS — N186 End stage renal disease: Secondary | ICD-10-CM | POA: Diagnosis not present

## 2019-07-08 DIAGNOSIS — Z23 Encounter for immunization: Secondary | ICD-10-CM | POA: Diagnosis not present

## 2019-07-08 DIAGNOSIS — E875 Hyperkalemia: Secondary | ICD-10-CM | POA: Diagnosis not present

## 2019-07-08 DIAGNOSIS — Z992 Dependence on renal dialysis: Secondary | ICD-10-CM | POA: Diagnosis not present

## 2019-07-08 DIAGNOSIS — E1121 Type 2 diabetes mellitus with diabetic nephropathy: Secondary | ICD-10-CM | POA: Diagnosis not present

## 2019-07-08 DIAGNOSIS — D689 Coagulation defect, unspecified: Secondary | ICD-10-CM | POA: Diagnosis not present

## 2019-07-08 DIAGNOSIS — D509 Iron deficiency anemia, unspecified: Secondary | ICD-10-CM | POA: Diagnosis not present

## 2019-07-08 DIAGNOSIS — R52 Pain, unspecified: Secondary | ICD-10-CM | POA: Diagnosis not present

## 2019-07-10 DIAGNOSIS — E875 Hyperkalemia: Secondary | ICD-10-CM | POA: Diagnosis not present

## 2019-07-10 DIAGNOSIS — D689 Coagulation defect, unspecified: Secondary | ICD-10-CM | POA: Diagnosis not present

## 2019-07-10 DIAGNOSIS — Z23 Encounter for immunization: Secondary | ICD-10-CM | POA: Diagnosis not present

## 2019-07-10 DIAGNOSIS — N186 End stage renal disease: Secondary | ICD-10-CM | POA: Diagnosis not present

## 2019-07-10 DIAGNOSIS — E1121 Type 2 diabetes mellitus with diabetic nephropathy: Secondary | ICD-10-CM | POA: Diagnosis not present

## 2019-07-10 DIAGNOSIS — D509 Iron deficiency anemia, unspecified: Secondary | ICD-10-CM | POA: Diagnosis not present

## 2019-07-10 DIAGNOSIS — Z992 Dependence on renal dialysis: Secondary | ICD-10-CM | POA: Diagnosis not present

## 2019-07-10 DIAGNOSIS — N2581 Secondary hyperparathyroidism of renal origin: Secondary | ICD-10-CM | POA: Diagnosis not present

## 2019-07-10 DIAGNOSIS — L299 Pruritus, unspecified: Secondary | ICD-10-CM | POA: Diagnosis not present

## 2019-07-10 DIAGNOSIS — G894 Chronic pain syndrome: Secondary | ICD-10-CM | POA: Diagnosis not present

## 2019-07-10 DIAGNOSIS — M159 Polyosteoarthritis, unspecified: Secondary | ICD-10-CM | POA: Diagnosis not present

## 2019-07-10 DIAGNOSIS — R52 Pain, unspecified: Secondary | ICD-10-CM | POA: Diagnosis not present

## 2019-07-12 DIAGNOSIS — N186 End stage renal disease: Secondary | ICD-10-CM | POA: Diagnosis not present

## 2019-07-12 DIAGNOSIS — D509 Iron deficiency anemia, unspecified: Secondary | ICD-10-CM | POA: Diagnosis not present

## 2019-07-12 DIAGNOSIS — L299 Pruritus, unspecified: Secondary | ICD-10-CM | POA: Diagnosis not present

## 2019-07-12 DIAGNOSIS — E1121 Type 2 diabetes mellitus with diabetic nephropathy: Secondary | ICD-10-CM | POA: Diagnosis not present

## 2019-07-12 DIAGNOSIS — E875 Hyperkalemia: Secondary | ICD-10-CM | POA: Diagnosis not present

## 2019-07-12 DIAGNOSIS — N2581 Secondary hyperparathyroidism of renal origin: Secondary | ICD-10-CM | POA: Diagnosis not present

## 2019-07-12 DIAGNOSIS — R52 Pain, unspecified: Secondary | ICD-10-CM | POA: Diagnosis not present

## 2019-07-12 DIAGNOSIS — Z992 Dependence on renal dialysis: Secondary | ICD-10-CM | POA: Diagnosis not present

## 2019-07-12 DIAGNOSIS — Z23 Encounter for immunization: Secondary | ICD-10-CM | POA: Diagnosis not present

## 2019-07-12 DIAGNOSIS — D689 Coagulation defect, unspecified: Secondary | ICD-10-CM | POA: Diagnosis not present

## 2019-07-13 DIAGNOSIS — R52 Pain, unspecified: Secondary | ICD-10-CM | POA: Diagnosis not present

## 2019-07-13 DIAGNOSIS — Z23 Encounter for immunization: Secondary | ICD-10-CM | POA: Diagnosis not present

## 2019-07-13 DIAGNOSIS — N186 End stage renal disease: Secondary | ICD-10-CM | POA: Diagnosis not present

## 2019-07-13 DIAGNOSIS — D509 Iron deficiency anemia, unspecified: Secondary | ICD-10-CM | POA: Diagnosis not present

## 2019-07-13 DIAGNOSIS — E1121 Type 2 diabetes mellitus with diabetic nephropathy: Secondary | ICD-10-CM | POA: Diagnosis not present

## 2019-07-13 DIAGNOSIS — L299 Pruritus, unspecified: Secondary | ICD-10-CM | POA: Diagnosis not present

## 2019-07-13 DIAGNOSIS — Z992 Dependence on renal dialysis: Secondary | ICD-10-CM | POA: Diagnosis not present

## 2019-07-13 DIAGNOSIS — N2581 Secondary hyperparathyroidism of renal origin: Secondary | ICD-10-CM | POA: Diagnosis not present

## 2019-07-13 DIAGNOSIS — D689 Coagulation defect, unspecified: Secondary | ICD-10-CM | POA: Diagnosis not present

## 2019-07-13 DIAGNOSIS — E875 Hyperkalemia: Secondary | ICD-10-CM | POA: Diagnosis not present

## 2019-07-15 DIAGNOSIS — N2581 Secondary hyperparathyroidism of renal origin: Secondary | ICD-10-CM | POA: Diagnosis not present

## 2019-07-15 DIAGNOSIS — D509 Iron deficiency anemia, unspecified: Secondary | ICD-10-CM | POA: Diagnosis not present

## 2019-07-15 DIAGNOSIS — D689 Coagulation defect, unspecified: Secondary | ICD-10-CM | POA: Diagnosis not present

## 2019-07-15 DIAGNOSIS — Z992 Dependence on renal dialysis: Secondary | ICD-10-CM | POA: Diagnosis not present

## 2019-07-15 DIAGNOSIS — R52 Pain, unspecified: Secondary | ICD-10-CM | POA: Diagnosis not present

## 2019-07-15 DIAGNOSIS — E1121 Type 2 diabetes mellitus with diabetic nephropathy: Secondary | ICD-10-CM | POA: Diagnosis not present

## 2019-07-15 DIAGNOSIS — E875 Hyperkalemia: Secondary | ICD-10-CM | POA: Diagnosis not present

## 2019-07-15 DIAGNOSIS — Z23 Encounter for immunization: Secondary | ICD-10-CM | POA: Diagnosis not present

## 2019-07-15 DIAGNOSIS — N186 End stage renal disease: Secondary | ICD-10-CM | POA: Diagnosis not present

## 2019-07-15 DIAGNOSIS — L299 Pruritus, unspecified: Secondary | ICD-10-CM | POA: Diagnosis not present

## 2019-07-18 ENCOUNTER — Encounter: Payer: Self-pay | Admitting: Family

## 2019-07-18 ENCOUNTER — Other Ambulatory Visit: Payer: Self-pay

## 2019-07-18 ENCOUNTER — Ambulatory Visit (INDEPENDENT_AMBULATORY_CARE_PROVIDER_SITE_OTHER): Payer: Medicare Other | Admitting: Family

## 2019-07-18 DIAGNOSIS — R234 Changes in skin texture: Secondary | ICD-10-CM | POA: Diagnosis not present

## 2019-07-18 MED ORDER — COLLAGENASE 250 UNIT/GM EX OINT: 1.0000 "application " | TOPICAL_OINTMENT | Freq: Every day | CUTANEOUS | 3 refills | Status: DC

## 2019-07-19 ENCOUNTER — Telehealth: Payer: Self-pay | Admitting: Orthopedic Surgery

## 2019-07-19 DIAGNOSIS — N2581 Secondary hyperparathyroidism of renal origin: Secondary | ICD-10-CM | POA: Diagnosis not present

## 2019-07-19 DIAGNOSIS — E875 Hyperkalemia: Secondary | ICD-10-CM | POA: Diagnosis not present

## 2019-07-19 DIAGNOSIS — D689 Coagulation defect, unspecified: Secondary | ICD-10-CM | POA: Diagnosis not present

## 2019-07-19 DIAGNOSIS — Z992 Dependence on renal dialysis: Secondary | ICD-10-CM | POA: Diagnosis not present

## 2019-07-19 DIAGNOSIS — Z23 Encounter for immunization: Secondary | ICD-10-CM | POA: Diagnosis not present

## 2019-07-19 DIAGNOSIS — E1121 Type 2 diabetes mellitus with diabetic nephropathy: Secondary | ICD-10-CM | POA: Diagnosis not present

## 2019-07-19 DIAGNOSIS — L299 Pruritus, unspecified: Secondary | ICD-10-CM | POA: Diagnosis not present

## 2019-07-19 DIAGNOSIS — D509 Iron deficiency anemia, unspecified: Secondary | ICD-10-CM | POA: Diagnosis not present

## 2019-07-19 DIAGNOSIS — Z9181 History of falling: Secondary | ICD-10-CM | POA: Diagnosis not present

## 2019-07-19 DIAGNOSIS — Z0000 Encounter for general adult medical examination without abnormal findings: Secondary | ICD-10-CM | POA: Diagnosis not present

## 2019-07-19 DIAGNOSIS — R52 Pain, unspecified: Secondary | ICD-10-CM | POA: Diagnosis not present

## 2019-07-19 DIAGNOSIS — N186 End stage renal disease: Secondary | ICD-10-CM | POA: Diagnosis not present

## 2019-07-19 DIAGNOSIS — E785 Hyperlipidemia, unspecified: Secondary | ICD-10-CM | POA: Diagnosis not present

## 2019-07-19 NOTE — Telephone Encounter (Signed)
Michael Riggs from [facility] called. Says medicare requires to know the size of the wounds and notes for the RX to be filled. Their call back number is [phone] and the fax number is [phone]

## 2019-07-19 NOTE — Telephone Encounter (Signed)
I faxed my note to above number

## 2019-07-19 NOTE — Telephone Encounter (Signed)
Please see message below and advise.

## 2019-07-19 NOTE — Progress Notes (Signed)
Office Visit Note   Patient: Michael Riggs           Date of Birth: 1970-10-12           MRN: [id number] Visit Date: 07/18/2019              Requested by: [name], MD [address] [place],  [place] [address] PCP: [name], MD  Chief Complaint  Patient presents with  . Right Leg - Follow-up  . 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  Patient has shown remarkable improvement with his current care. Had been doing santyl dressing changes at one point. Appears to be doing dry dressings or leaving wounds open to air.   Assessment & Plan: Visit Diagnoses:  No diagnosis found.  Plan: A new prescription was called in for Santyl. He is to do daily dressing changes he will wash his leg with soap and water.  Patient is showing excellent improvement and progression.  Follow-Up Instructions: Return in about 2 weeks (around 08/01/2019).   Ortho Exam  Patient is alert, oriented, no adenopathy, well-dressed, normal affect, normal respiratory effort. Examination patient has multiple eschars on both lower extremities several of the calciphylaxis wounds have healed. After informed consent the eschar skin and soft tissue was debrided from multiple sites of both lower extremities with a 10 blade knife. Largest wound is 16 cm x 5 cm on right thigh. Has 5 cm in diameter ulcer to right calf. Left thigh with ulcer that is 6 x 3 cm. Multiple smaller wounds.  Patient had good granulation tissue underlying. there is no purulence no cellulitis no signs of infection.  silvadene 4 x 4 and Ace wrap was applied to both lower extremities.  Patient currently cannot wear his prosthesis on the left due to the ulcerations on the left thigh.  Imaging: No results found. No images are attached to the encounter.  Labs: Lab Results  Component Value Date   HGBA1C 5.3 05/16/2019   HGBA1C 5.0 01/31/2019     ESRSEDRATE >140 (H) 03/28/2019   CRP 19.4 (H) 03/28/2019   REPTSTATUS 04/14/2019 FINAL 04/11/2019   GRAMSTAIN  04/11/2019    RARE WBC PRESENT,BOTH PMN AND MONONUCLEAR NO ORGANISMS SEEN    CULT  04/11/2019    RARE Consistent with normal respiratory flora. Performed at [facility], [place] [address], [place], [place] [address]    Doy Hutching ENTEROCOCCUS FAECIUM (A) 03/10/2019     Lab Results  Component Value Date   ALBUMIN 2.1 (L) 05/23/2019   ALBUMIN 2.1 (L) 05/22/2019   ALBUMIN 2.1 (L) 05/21/2019   PREALBUMIN 9.1 (L) 03/28/2019    Lab Results  Component Value Date   MG 1.8 05/18/2019   MG 1.8 04/12/2019   MG 1.8 04/11/2019   No results found for: [facility]  Lab Results  Component Value Date   PREALBUMIN 9.1 (L) 03/28/2019   CBC EXTENDED Latest Ref Rng & Units 07/06/2019 05/23/2019 05/22/2019  WBC 4.0 - 10.5 K/uL - - 5.9  RBC 4.22 - 5.81 MIL/uL - - 2.97(L)  HGB 13.0 - 17.0 g/dL 13.9 9.0(L) 8.5(L)  HCT 39.0 - 52.0 % 41.0 28.0(L) 27.1(L)  PLT 150 - 400 K/uL - - 323  NEUTROABS 1.7 - 7.7 K/uL - - -  LYMPHSABS 0.7 - 4.0 K/uL - - -     Body mass index is 30.71 kg/m.  Orders:  No orders of the defined types were placed in this encounter.  Meds ordered this encounter  Medications  . collagenase (SANTYL) ointment    Sig: Apply 1 application topically daily. Apply to the affected area daily plus dry dressing    Dispense:  90 g    Refill:  3     Procedures: No procedures performed  Clinical Data: No additional findings.  ROS:  All other systems negative, except as noted in the HPI. Review of Systems  Constitutional: Negative for chills and fever.  Skin: Positive for wound. Negative for color change.    Objective: Vital Signs: Ht 5\' 10"  (1.778 m)   Wt 214 lb (97.1 kg)   BMI 30.71 kg/m   Specialty Comments:  No specialty comments available.  PMFS History: Patient Active Problem List   Diagnosis Date Noted  . Lower GI bleed 05/14/2019  . Pressure  injury of skin 04/27/2019  . Palliative care encounter   . Goals of care, counseling/discussion   . Calciphylaxis   . Wound infection 03/28/2019  . Hypertension   . Dyslipidemia   . Diabetes mellitus with peripheral vascular disease ([place])   . Depression   . Physical deconditioning   . Pruritus   . Scrotal pain   . Labile blood pressure   . Scrotal edema   . Status post below-knee amputation of left lower extremity ([place])   . Slow transit constipation   . Leukocytosis   . Sleep disturbance   . Essential hypertension   . Anemia of chronic disease   . Controlled type 2 diabetes mellitus with hyperglycemia, without long-term current use of insulin ([place])   . ESRD on dialysis ([place])   . Urinary retention   . Debility 02/09/2019  . Fluid overload 02/04/2019  . Hyperkalemia 01/30/2019  . ARF (acute renal failure) ([place]) 01/30/2019  . Hypokalemia 01/30/2019  . Hypoglycemia 01/30/2019  . Syphilis 01/30/2019  . Macrocytic anemia 01/30/2019  . End stage renal disease ([place])   . ESRD (end stage renal disease) on dialysis ([place]) 01/2019  . PDR (proliferative diabetic retinopathy) ([place]) 12/14/2013  . Venous hypertension 09/22/2013  . Diabetes mellitus type 2 in obese ([place]) 07/04/2013  . Habitual alcohol use 07/04/2013  . Obesity, Class II, BMI 35-39.9, with comorbidity 07/04/2013   Past Medical History:  Diagnosis Date  . Anemia   . Anxiety   . Depression   . Diabetes mellitus with peripheral vascular disease (HCC)    type 2 no meds, lost 100 lbs  . Dyslipidemia   . Dyspnea    inhaler  . ESRD (end stage renal disease) on dialysis ([place]) 01/2019   MWFS  . GERD (gastroesophageal reflux disease)   . Headache   . Hypertension   . Physical deconditioning   . Sleep apnea    uses CPAP  . Stroke [facility])    mini -shown on CT scan  . Syphilis 01/2019    History reviewed. No pertinent family history.  Past Surgical History:  Procedure Laterality Date  . AV FISTULA PLACEMENT Left  01/09/2019   Procedure: ARTERIOVENOUS (AV) FISTULA CREATION LEFT UPPER ARM;  Surgeon: [name], MD;  Location: [facility];  Service: Vascular;  Laterality: Left;  . BASCILIC VEIN TRANSPOSITION Left 07/06/2019   Procedure: BASILIC VEIN TRANSPOSITION SECOND STAGE LEFT ARM;  Surgeon: [name], MD;  [facility];  Service: Vascular;  Laterality: Left;  . below the knee amputation Left   . EYE SURGERY Bilateral   .  FLEXIBLE SIGMOIDOSCOPY N/A 05/15/2019   Procedure: FLEXIBLE SIGMOIDOSCOPY;  Surgeon: Rush Landmark Telford Nab., MD;  Location: [facility];  Service: Gastroenterology;  Laterality: N/A;  . HEMOSTASIS CLIP PLACEMENT  05/15/2019   Procedure: HEMOSTASIS CLIP PLACEMENT;  Surgeon: Irving Copas., MD;  Location: [facility];  Service: Gastroenterology;;  . HOT HEMOSTASIS N/A 05/15/2019   Procedure: HOT HEMOSTASIS (ARGON PLASMA COAGULATION/BICAP);  Surgeon: Irving Copas., MD;  Location: [facility];  Service: Gastroenterology;  Laterality: N/A;  . IR FLUORO GUIDE CV LINE RIGHT  01/31/2019  . IR FLUORO GUIDE CV LINE RIGHT  02/03/2019  . IR US GUIDE VASC ACCESS RIGHT  01/31/2019  . IR US GUIDE VASC ACCESS RIGHT  02/03/2019  . SCLEROTHERAPY  05/15/2019   Procedure: Clide Deutscher;  Surgeon: Mansouraty, Telford Nab., MD;  Location: [facility];  Service: Gastroenterology;;   Social History   Occupational History  . Not on file  Tobacco Use  . Smoking status: Former Smoker    Types: Cigarettes    Start date: 11/18/2018    Quit date: 01/18/2019    Years since quitting: 0.4  . Smokeless tobacco: Never Used  . Tobacco comment: pt stated got bored with it  Substance and Sexual Activity  . Alcohol use: Not Currently    Comment: heavy drinker in the past, none since 07/19/18  . Drug use: Not Currently  . Sexual activity: Not Currently

## 2019-07-20 DIAGNOSIS — N186 End stage renal disease: Secondary | ICD-10-CM | POA: Diagnosis not present

## 2019-07-20 DIAGNOSIS — Z992 Dependence on renal dialysis: Secondary | ICD-10-CM | POA: Diagnosis not present

## 2019-07-20 DIAGNOSIS — E1122 Type 2 diabetes mellitus with diabetic chronic kidney disease: Secondary | ICD-10-CM | POA: Diagnosis not present

## 2019-07-21 DIAGNOSIS — R269 Unspecified abnormalities of gait and mobility: Secondary | ICD-10-CM | POA: Diagnosis not present

## 2019-07-21 DIAGNOSIS — Z89512 Acquired absence of left leg below knee: Secondary | ICD-10-CM | POA: Diagnosis not present

## 2019-07-21 DIAGNOSIS — M6281 Muscle weakness (generalized): Secondary | ICD-10-CM | POA: Diagnosis not present

## 2019-07-21 DIAGNOSIS — G8929 Other chronic pain: Secondary | ICD-10-CM | POA: Diagnosis not present

## 2019-07-21 DIAGNOSIS — R262 Difficulty in walking, not elsewhere classified: Secondary | ICD-10-CM | POA: Diagnosis not present

## 2019-07-24 ENCOUNTER — Ambulatory Visit: Payer: Medicare Other | Admitting: Orthopedic Surgery

## 2019-07-24 DIAGNOSIS — R197 Diarrhea, unspecified: Secondary | ICD-10-CM | POA: Insufficient documentation

## 2019-07-24 DIAGNOSIS — G894 Chronic pain syndrome: Secondary | ICD-10-CM | POA: Diagnosis not present

## 2019-07-24 DIAGNOSIS — N186 End stage renal disease: Secondary | ICD-10-CM | POA: Diagnosis not present

## 2019-07-24 DIAGNOSIS — Z992 Dependence on renal dialysis: Secondary | ICD-10-CM | POA: Diagnosis not present

## 2019-07-24 DIAGNOSIS — N2581 Secondary hyperparathyroidism of renal origin: Secondary | ICD-10-CM | POA: Diagnosis not present

## 2019-07-24 DIAGNOSIS — M159 Polyosteoarthritis, unspecified: Secondary | ICD-10-CM | POA: Diagnosis not present

## 2019-07-24 DIAGNOSIS — I951 Orthostatic hypotension: Secondary | ICD-10-CM | POA: Diagnosis not present

## 2019-07-24 DIAGNOSIS — E875 Hyperkalemia: Secondary | ICD-10-CM | POA: Diagnosis not present

## 2019-07-24 DIAGNOSIS — D689 Coagulation defect, unspecified: Secondary | ICD-10-CM | POA: Diagnosis not present

## 2019-07-26 DIAGNOSIS — Z992 Dependence on renal dialysis: Secondary | ICD-10-CM | POA: Diagnosis not present

## 2019-07-26 DIAGNOSIS — E875 Hyperkalemia: Secondary | ICD-10-CM | POA: Diagnosis not present

## 2019-07-26 DIAGNOSIS — N2581 Secondary hyperparathyroidism of renal origin: Secondary | ICD-10-CM | POA: Diagnosis not present

## 2019-07-26 DIAGNOSIS — N186 End stage renal disease: Secondary | ICD-10-CM | POA: Diagnosis not present

## 2019-07-26 DIAGNOSIS — R197 Diarrhea, unspecified: Secondary | ICD-10-CM | POA: Diagnosis not present

## 2019-07-26 DIAGNOSIS — D689 Coagulation defect, unspecified: Secondary | ICD-10-CM | POA: Diagnosis not present

## 2019-07-28 DIAGNOSIS — E875 Hyperkalemia: Secondary | ICD-10-CM | POA: Diagnosis not present

## 2019-07-28 DIAGNOSIS — D689 Coagulation defect, unspecified: Secondary | ICD-10-CM | POA: Diagnosis not present

## 2019-07-28 DIAGNOSIS — R197 Diarrhea, unspecified: Secondary | ICD-10-CM | POA: Diagnosis not present

## 2019-07-28 DIAGNOSIS — Z992 Dependence on renal dialysis: Secondary | ICD-10-CM | POA: Diagnosis not present

## 2019-07-28 DIAGNOSIS — N186 End stage renal disease: Secondary | ICD-10-CM | POA: Diagnosis not present

## 2019-07-28 DIAGNOSIS — N2581 Secondary hyperparathyroidism of renal origin: Secondary | ICD-10-CM | POA: Diagnosis not present

## 2019-07-31 DIAGNOSIS — E875 Hyperkalemia: Secondary | ICD-10-CM | POA: Diagnosis not present

## 2019-07-31 DIAGNOSIS — D689 Coagulation defect, unspecified: Secondary | ICD-10-CM | POA: Diagnosis not present

## 2019-07-31 DIAGNOSIS — Z992 Dependence on renal dialysis: Secondary | ICD-10-CM | POA: Diagnosis not present

## 2019-07-31 DIAGNOSIS — N186 End stage renal disease: Secondary | ICD-10-CM | POA: Diagnosis not present

## 2019-07-31 DIAGNOSIS — R197 Diarrhea, unspecified: Secondary | ICD-10-CM | POA: Diagnosis not present

## 2019-07-31 DIAGNOSIS — N2581 Secondary hyperparathyroidism of renal origin: Secondary | ICD-10-CM | POA: Diagnosis not present

## 2019-08-01 ENCOUNTER — Encounter: Payer: Self-pay | Admitting: Family

## 2019-08-01 ENCOUNTER — Ambulatory Visit (INDEPENDENT_AMBULATORY_CARE_PROVIDER_SITE_OTHER): Payer: Medicare Other | Admitting: Family

## 2019-08-01 ENCOUNTER — Other Ambulatory Visit: Payer: Self-pay

## 2019-08-01 DIAGNOSIS — E1151 Type 2 diabetes mellitus with diabetic peripheral angiopathy without gangrene: Secondary | ICD-10-CM

## 2019-08-01 DIAGNOSIS — Z89512 Acquired absence of left leg below knee: Secondary | ICD-10-CM

## 2019-08-01 NOTE — Progress Notes (Signed)
Office Visit Note   Patient: Michael Riggs           Date of Birth: 12-02-1970           MRN: [id number] Visit Date: 08/01/2019              Requested by: [name], MD [address] [place],  [place] [address] PCP: [name], MD  Chief Complaint  Patient presents with  . Left Leg - Follow-up  . 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  Patient continues to show remarkable improvement with his current care. Has been doing santyl dressing changes.   Assessment & Plan: Visit Diagnoses:  No diagnosis found.  Plan: he will continue with his current care. Recommended silvadene dressings.  Follow-Up Instructions: Return in about 3 weeks (around 08/22/2019).   Ortho Exam  Patient is alert, oriented, no adenopathy, well-dressed, normal affect, normal respiratory effort. Examination patient now has 3 large eschars on both lower extremities several of the calciphylaxis wounds have healed. After informed consent the eschar skin and soft tissue was debrided from multiple sites of both lower extremities with a 10 blade knife. Largest wound is 10 cm x 6 cm on left thigh. Has 5 cm in diameter ulcer to right calf. Right thigh with ulcer that is 6 x 3 cm. smaller wounds are 1 cm in diameter or smaller.  Patient had good granulation tissue underlying. there is no purulence no cellulitis no signs of infection.  silvadene 4 x 4 and Ace wrap was applied to both lower extremities.   Imaging: No results found. No images are attached to the encounter.  Labs: Lab Results  Component Value Date   HGBA1C 5.3 05/16/2019   HGBA1C 5.0 01/31/2019   ESRSEDRATE >140 (H) 03/28/2019   CRP 19.4 (H) 03/28/2019   REPTSTATUS 04/14/2019 FINAL 04/11/2019   GRAMSTAIN  04/11/2019    RARE WBC PRESENT,BOTH PMN AND MONONUCLEAR NO ORGANISMS SEEN    CULT  04/11/2019    RARE Consistent with normal  respiratory flora. Performed at [facility], [place] [address] [place], [place] [address]    Doy Hutching ENTEROCOCCUS FAECIUM (A) 03/10/2019     Lab Results  Component Value Date   ALBUMIN 2.1 (L) 05/23/2019   ALBUMIN 2.1 (L) 05/22/2019   ALBUMIN 2.1 (L) 05/21/2019   PREALBUMIN 9.1 (L) 03/28/2019    Lab Results  Component Value Date   MG 1.8 05/18/2019   MG 1.8 04/12/2019   MG 1.8 04/11/2019   No results found for: [facility]  Lab Results  Component Value Date   PREALBUMIN 9.1 (L) 03/28/2019   CBC EXTENDED Latest Ref Rng & Units 07/06/2019 05/23/2019 05/22/2019  WBC 4.0 - 10.5 K/uL - - 5.9  RBC 4.22 - 5.81 MIL/uL - - 2.97(L)  HGB 13.0 - 17.0 g/dL 13.9 9.0(L) 8.5(L)  HCT 39.0 - 52.0 % 41.0 28.0(L) 27.1(L)  PLT 150 - 400 K/uL - - 323  NEUTROABS 1.7 - 7.7 K/uL - - -  LYMPHSABS 0.7 - 4.0 K/uL - - -     Body mass index is 30.71 kg/m.  Orders:  No orders of the defined types were placed in this encounter.  No orders of the defined types were placed in this encounter.    Procedures: No procedures performed  Clinical Data: No additional findings.  ROS:  All other systems negative,  except as noted in the HPI. Review of Systems  Constitutional: Negative for chills and fever.  Skin: Positive for wound. Negative for color change.    Objective: Vital Signs: Ht 5\' 10"  (1.778 m)   Wt 214 lb (97.1 kg)   BMI 30.71 kg/m   Specialty Comments:  No specialty comments available.  PMFS History: Patient Active Problem List   Diagnosis Date Noted  . Lower GI bleed 05/14/2019  . Pressure injury of skin 04/27/2019  . Palliative care encounter   . Goals of care, counseling/discussion   . Calciphylaxis   . Wound infection 03/28/2019  . Hypertension   . Dyslipidemia   . Diabetes mellitus with peripheral vascular disease ([place])   . Depression   . Physical deconditioning   . Pruritus   . Scrotal pain   . Labile blood pressure   . Scrotal edema   . Status post  below-knee amputation of left lower extremity ([place])   . Slow transit constipation   . Leukocytosis   . Sleep disturbance   . Essential hypertension   . Anemia of chronic disease   . Controlled type 2 diabetes mellitus with hyperglycemia, without long-term current use of insulin ([place])   . ESRD on dialysis ([place])   . Urinary retention   . Debility 02/09/2019  . Fluid overload 02/04/2019  . Hyperkalemia 01/30/2019  . ARF (acute renal failure) ([place]) 01/30/2019  . Hypokalemia 01/30/2019  . Hypoglycemia 01/30/2019  . Syphilis 01/30/2019  . Macrocytic anemia 01/30/2019  . End stage renal disease ([place])   . ESRD (end stage renal disease) on dialysis ([place]) 01/2019  . PDR (proliferative diabetic retinopathy) ([place]) 12/14/2013  . Venous hypertension 09/22/2013  . Diabetes mellitus type 2 in obese ([place]) 07/04/2013  . Habitual alcohol use 07/04/2013  . Obesity, Class II, BMI 35-39.9, with comorbidity 07/04/2013   Past Medical History:  Diagnosis Date  . Anemia   . Anxiety   . Depression   . Diabetes mellitus with peripheral vascular disease (HCC)    type 2 no meds, lost 100 lbs  . Dyslipidemia   . Dyspnea    inhaler  . ESRD (end stage renal disease) on dialysis ([place]) 01/2019   MWFS  . GERD (gastroesophageal reflux disease)   . Headache   . Hypertension   . Physical deconditioning   . Sleep apnea    uses CPAP  . Stroke [facility])    mini -shown on CT scan  . Syphilis 01/2019    History reviewed. No pertinent family history.  Past Surgical History:  Procedure Laterality Date  . AV FISTULA PLACEMENT Left 01/09/2019   Procedure: ARTERIOVENOUS (AV) FISTULA CREATION LEFT UPPER ARM;  Surgeon: [name], MD;  Location: [facility];  Service: Vascular;  Laterality: Left;  . BASCILIC VEIN TRANSPOSITION Left 07/06/2019   Procedure: BASILIC VEIN TRANSPOSITION SECOND STAGE LEFT ARM;  Surgeon: [name], MD;  [facility];  Service: Vascular;  Laterality: Left;  . below the knee amputation  Left   . EYE SURGERY Bilateral   . FLEXIBLE SIGMOIDOSCOPY N/A 05/15/2019   Procedure: FLEXIBLE SIGMOIDOSCOPY;  Surgeon: Rush Landmark Telford Nab., MD;  Location: [facility];  Service: Gastroenterology;  Laterality: N/A;  . HEMOSTASIS CLIP PLACEMENT  05/15/2019   Procedure: HEMOSTASIS CLIP PLACEMENT;  Surgeon: Irving Copas., MD;  Location: [facility];  Service: Gastroenterology;;  . HOT HEMOSTASIS N/A 05/15/2019   Procedure: HOT HEMOSTASIS (ARGON PLASMA COAGULATION/BICAP);  Surgeon: Rush Landmark Telford Nab., MD;  Location: [facility];  Service: Gastroenterology;  Laterality: N/A;  . IR FLUORO GUIDE CV LINE RIGHT  01/31/2019  . IR FLUORO GUIDE CV LINE RIGHT  02/03/2019  . IR US GUIDE VASC ACCESS RIGHT  01/31/2019  . IR US GUIDE VASC ACCESS RIGHT  02/03/2019  . SCLEROTHERAPY  05/15/2019   Procedure: Clide Deutscher;  Surgeon: Mansouraty, Telford Nab., MD;  Location: [facility];  Service: Gastroenterology;;   Social History   Occupational History  . Not on file  Tobacco Use  . Smoking status: Former Smoker    Types: Cigarettes    Start date: 11/18/2018    Quit date: 01/18/2019    Years since quitting: 0.5  . Smokeless tobacco: Never Used  . Tobacco comment: pt stated got bored with it  Substance and Sexual Activity  . Alcohol use: Not Currently    Comment: heavy drinker in the past, none since 07/19/18  . Drug use: Not Currently  . Sexual activity: Not Currently

## 2019-08-02 DIAGNOSIS — N186 End stage renal disease: Secondary | ICD-10-CM | POA: Diagnosis not present

## 2019-08-02 DIAGNOSIS — N2581 Secondary hyperparathyroidism of renal origin: Secondary | ICD-10-CM | POA: Diagnosis not present

## 2019-08-02 DIAGNOSIS — R197 Diarrhea, unspecified: Secondary | ICD-10-CM | POA: Diagnosis not present

## 2019-08-02 DIAGNOSIS — E875 Hyperkalemia: Secondary | ICD-10-CM | POA: Diagnosis not present

## 2019-08-02 DIAGNOSIS — Z992 Dependence on renal dialysis: Secondary | ICD-10-CM | POA: Diagnosis not present

## 2019-08-02 DIAGNOSIS — D689 Coagulation defect, unspecified: Secondary | ICD-10-CM | POA: Diagnosis not present

## 2019-08-04 DIAGNOSIS — N2581 Secondary hyperparathyroidism of renal origin: Secondary | ICD-10-CM | POA: Diagnosis not present

## 2019-08-04 DIAGNOSIS — E875 Hyperkalemia: Secondary | ICD-10-CM | POA: Diagnosis not present

## 2019-08-04 DIAGNOSIS — D689 Coagulation defect, unspecified: Secondary | ICD-10-CM | POA: Diagnosis not present

## 2019-08-04 DIAGNOSIS — N186 End stage renal disease: Secondary | ICD-10-CM | POA: Diagnosis not present

## 2019-08-04 DIAGNOSIS — Z992 Dependence on renal dialysis: Secondary | ICD-10-CM | POA: Diagnosis not present

## 2019-08-04 DIAGNOSIS — R197 Diarrhea, unspecified: Secondary | ICD-10-CM | POA: Diagnosis not present

## 2019-08-07 DIAGNOSIS — D689 Coagulation defect, unspecified: Secondary | ICD-10-CM | POA: Diagnosis not present

## 2019-08-07 DIAGNOSIS — E1121 Type 2 diabetes mellitus with diabetic nephropathy: Secondary | ICD-10-CM | POA: Diagnosis not present

## 2019-08-07 DIAGNOSIS — D631 Anemia in chronic kidney disease: Secondary | ICD-10-CM | POA: Diagnosis not present

## 2019-08-07 DIAGNOSIS — Z992 Dependence on renal dialysis: Secondary | ICD-10-CM | POA: Diagnosis not present

## 2019-08-07 DIAGNOSIS — N2581 Secondary hyperparathyroidism of renal origin: Secondary | ICD-10-CM | POA: Diagnosis not present

## 2019-08-07 DIAGNOSIS — E875 Hyperkalemia: Secondary | ICD-10-CM | POA: Diagnosis not present

## 2019-08-07 DIAGNOSIS — Z23 Encounter for immunization: Secondary | ICD-10-CM | POA: Diagnosis not present

## 2019-08-07 DIAGNOSIS — N186 End stage renal disease: Secondary | ICD-10-CM | POA: Diagnosis not present

## 2019-08-09 DIAGNOSIS — Z992 Dependence on renal dialysis: Secondary | ICD-10-CM | POA: Diagnosis not present

## 2019-08-09 DIAGNOSIS — E1121 Type 2 diabetes mellitus with diabetic nephropathy: Secondary | ICD-10-CM | POA: Diagnosis not present

## 2019-08-09 DIAGNOSIS — E875 Hyperkalemia: Secondary | ICD-10-CM | POA: Diagnosis not present

## 2019-08-09 DIAGNOSIS — D631 Anemia in chronic kidney disease: Secondary | ICD-10-CM | POA: Diagnosis not present

## 2019-08-09 DIAGNOSIS — D689 Coagulation defect, unspecified: Secondary | ICD-10-CM | POA: Diagnosis not present

## 2019-08-09 DIAGNOSIS — N2581 Secondary hyperparathyroidism of renal origin: Secondary | ICD-10-CM | POA: Diagnosis not present

## 2019-08-09 DIAGNOSIS — Z23 Encounter for immunization: Secondary | ICD-10-CM | POA: Diagnosis not present

## 2019-08-09 DIAGNOSIS — N186 End stage renal disease: Secondary | ICD-10-CM | POA: Diagnosis not present

## 2019-08-11 DIAGNOSIS — N186 End stage renal disease: Secondary | ICD-10-CM | POA: Diagnosis not present

## 2019-08-11 DIAGNOSIS — Z23 Encounter for immunization: Secondary | ICD-10-CM | POA: Diagnosis not present

## 2019-08-11 DIAGNOSIS — D689 Coagulation defect, unspecified: Secondary | ICD-10-CM | POA: Diagnosis not present

## 2019-08-11 DIAGNOSIS — D631 Anemia in chronic kidney disease: Secondary | ICD-10-CM | POA: Diagnosis not present

## 2019-08-11 DIAGNOSIS — E875 Hyperkalemia: Secondary | ICD-10-CM | POA: Diagnosis not present

## 2019-08-11 DIAGNOSIS — N2581 Secondary hyperparathyroidism of renal origin: Secondary | ICD-10-CM | POA: Diagnosis not present

## 2019-08-11 DIAGNOSIS — Z992 Dependence on renal dialysis: Secondary | ICD-10-CM | POA: Diagnosis not present

## 2019-08-11 DIAGNOSIS — E1121 Type 2 diabetes mellitus with diabetic nephropathy: Secondary | ICD-10-CM | POA: Diagnosis not present

## 2019-08-14 ENCOUNTER — Ambulatory Visit (INDEPENDENT_AMBULATORY_CARE_PROVIDER_SITE_OTHER): Payer: Self-pay | Admitting: Physician Assistant

## 2019-08-14 ENCOUNTER — Other Ambulatory Visit: Payer: Self-pay

## 2019-08-14 VITALS — BP 135/79 | HR 84 | Temp 98.0°F | Resp 18 | Ht 70.0 in | Wt 220.0 lb

## 2019-08-14 DIAGNOSIS — D689 Coagulation defect, unspecified: Secondary | ICD-10-CM | POA: Diagnosis not present

## 2019-08-14 DIAGNOSIS — Z992 Dependence on renal dialysis: Secondary | ICD-10-CM | POA: Diagnosis not present

## 2019-08-14 DIAGNOSIS — N2581 Secondary hyperparathyroidism of renal origin: Secondary | ICD-10-CM | POA: Diagnosis not present

## 2019-08-14 DIAGNOSIS — E1121 Type 2 diabetes mellitus with diabetic nephropathy: Secondary | ICD-10-CM | POA: Diagnosis not present

## 2019-08-14 DIAGNOSIS — E875 Hyperkalemia: Secondary | ICD-10-CM | POA: Diagnosis not present

## 2019-08-14 DIAGNOSIS — Z23 Encounter for immunization: Secondary | ICD-10-CM | POA: Diagnosis not present

## 2019-08-14 DIAGNOSIS — N186 End stage renal disease: Secondary | ICD-10-CM

## 2019-08-14 DIAGNOSIS — D631 Anemia in chronic kidney disease: Secondary | ICD-10-CM | POA: Diagnosis not present

## 2019-08-14 NOTE — Progress Notes (Signed)
  POST OPERATIVE OFFICE NOTE    CC:  F/u for surgery  HPI:  This is a 49 y.o. male who is s/p left second stage basilic fistula creation.  He is on HD via right [name] for the past 7 months.  He denise pain, loss of motor and has chronic peripheral neuropathy in B UE.    No Known Allergies  Current Outpatient Medications  Medication Sig Dispense Refill  . acetaminophen (TYLENOL) 325 MG tablet Take 2 tablets (650 mg total) by mouth every 6 (six) hours as needed for mild pain (or Fever >/= 101).    Marland Kitchen albuterol (VENTOLIN HFA) 108 (90 Base) MCG/ACT inhaler Inhale 2 puffs into the lungs every 6 (six) hours as needed for wheezing or shortness of breath.    Marland Kitchen amLODipine (NORVASC) 5 MG tablet Take 5 mg by mouth daily.    Marland Kitchen atorvastatin (LIPITOR) 40 MG tablet Take 1 tablet (40 mg total) by mouth daily.    . collagenase (SANTYL) ointment Apply 1 application topically daily. Apply to the affected area daily plus dry dressing 90 g 3  . Hydrocortisone (GERHARDT'S BUTT CREAM) CREA Apply 1 application topically 3 (three) times daily as needed for irritation. 1 each 0  . ipratropium-albuterol (DUONEB) 0.5-2.5 (3) MG/3ML SOLN Take 3 mLs by nebulization every 6 (six) hours as needed (For shortness of breath).    . Melatonin 5 MG TABS Take 5 mg by mouth at bedtime as needed (sleep).    . midodrine (PROAMATINE) 10 MG tablet Take 10 mg by mouth daily.    Marland Kitchen oxycodone (OXY-IR) 5 MG capsule Take 1 capsule (5 mg total) by mouth every 6 (six) hours as needed. 20 capsule 0  . pantoprazole (PROTONIX) 40 MG tablet Take 1 tablet (40 mg total) by mouth daily.    . tamsulosin (FLOMAX) 0.4 MG CAPS capsule Take 1 capsule (0.4 mg total) by mouth daily after breakfast. 30 capsule   . traMADol (ULTRAM) 50 MG tablet Take 1 tablet (50 mg total) by mouth every 8 (eight) hours as needed for moderate pain. 15 tablet 0   No current facility-administered medications for this visit.     ROS:  See HPI  Physical Exam:    Incision:   Well healed left UE incisions.  Visible fistula with excellent palpable thrill. Extremities:  Grip 55, sensation grossly intact with known chronicperipheral neuropathy Lungs: non labored breathing   Assessment/Plan:  This is a 49 y.o. male who is s/p:second stage left basilic fistula.  First stage was 01/09/19. He was in the hospital    The fistula has matured well with excellent thrill.  It can now be accessed for HD.  Once it is working well he will be scheduled for removal of his right TDC. [facility] was placed by IR 02/03/19.   Leontine Locket, PA-C [facility] [phone]  Clinic MD:  Trula Slade

## 2019-08-16 DIAGNOSIS — D689 Coagulation defect, unspecified: Secondary | ICD-10-CM | POA: Diagnosis not present

## 2019-08-16 DIAGNOSIS — N186 End stage renal disease: Secondary | ICD-10-CM | POA: Diagnosis not present

## 2019-08-16 DIAGNOSIS — Z23 Encounter for immunization: Secondary | ICD-10-CM | POA: Diagnosis not present

## 2019-08-16 DIAGNOSIS — N2581 Secondary hyperparathyroidism of renal origin: Secondary | ICD-10-CM | POA: Diagnosis not present

## 2019-08-16 DIAGNOSIS — D631 Anemia in chronic kidney disease: Secondary | ICD-10-CM | POA: Diagnosis not present

## 2019-08-16 DIAGNOSIS — Z992 Dependence on renal dialysis: Secondary | ICD-10-CM | POA: Diagnosis not present

## 2019-08-16 DIAGNOSIS — E1121 Type 2 diabetes mellitus with diabetic nephropathy: Secondary | ICD-10-CM | POA: Diagnosis not present

## 2019-08-16 DIAGNOSIS — E875 Hyperkalemia: Secondary | ICD-10-CM | POA: Diagnosis not present

## 2019-08-18 DIAGNOSIS — Z992 Dependence on renal dialysis: Secondary | ICD-10-CM | POA: Diagnosis not present

## 2019-08-18 DIAGNOSIS — D631 Anemia in chronic kidney disease: Secondary | ICD-10-CM | POA: Diagnosis not present

## 2019-08-18 DIAGNOSIS — N186 End stage renal disease: Secondary | ICD-10-CM | POA: Diagnosis not present

## 2019-08-18 DIAGNOSIS — D689 Coagulation defect, unspecified: Secondary | ICD-10-CM | POA: Diagnosis not present

## 2019-08-18 DIAGNOSIS — E875 Hyperkalemia: Secondary | ICD-10-CM | POA: Diagnosis not present

## 2019-08-18 DIAGNOSIS — N2581 Secondary hyperparathyroidism of renal origin: Secondary | ICD-10-CM | POA: Diagnosis not present

## 2019-08-18 DIAGNOSIS — E1121 Type 2 diabetes mellitus with diabetic nephropathy: Secondary | ICD-10-CM | POA: Diagnosis not present

## 2019-08-18 DIAGNOSIS — Z23 Encounter for immunization: Secondary | ICD-10-CM | POA: Diagnosis not present

## 2019-08-20 DIAGNOSIS — N186 End stage renal disease: Secondary | ICD-10-CM | POA: Diagnosis not present

## 2019-08-20 DIAGNOSIS — E1122 Type 2 diabetes mellitus with diabetic chronic kidney disease: Secondary | ICD-10-CM | POA: Diagnosis not present

## 2019-08-20 DIAGNOSIS — Z992 Dependence on renal dialysis: Secondary | ICD-10-CM | POA: Diagnosis not present

## 2019-08-21 DIAGNOSIS — N186 End stage renal disease: Secondary | ICD-10-CM | POA: Diagnosis not present

## 2019-08-21 DIAGNOSIS — Z992 Dependence on renal dialysis: Secondary | ICD-10-CM | POA: Diagnosis not present

## 2019-08-21 DIAGNOSIS — N2581 Secondary hyperparathyroidism of renal origin: Secondary | ICD-10-CM | POA: Diagnosis not present

## 2019-08-21 DIAGNOSIS — Z89512 Acquired absence of left leg below knee: Secondary | ICD-10-CM | POA: Diagnosis not present

## 2019-08-21 DIAGNOSIS — R269 Unspecified abnormalities of gait and mobility: Secondary | ICD-10-CM | POA: Diagnosis not present

## 2019-08-21 DIAGNOSIS — D689 Coagulation defect, unspecified: Secondary | ICD-10-CM | POA: Diagnosis not present

## 2019-08-21 DIAGNOSIS — D509 Iron deficiency anemia, unspecified: Secondary | ICD-10-CM | POA: Diagnosis not present

## 2019-08-21 DIAGNOSIS — E1121 Type 2 diabetes mellitus with diabetic nephropathy: Secondary | ICD-10-CM | POA: Diagnosis not present

## 2019-08-21 DIAGNOSIS — R52 Pain, unspecified: Secondary | ICD-10-CM | POA: Diagnosis not present

## 2019-08-21 DIAGNOSIS — G8929 Other chronic pain: Secondary | ICD-10-CM | POA: Diagnosis not present

## 2019-08-21 DIAGNOSIS — R262 Difficulty in walking, not elsewhere classified: Secondary | ICD-10-CM | POA: Diagnosis not present

## 2019-08-21 DIAGNOSIS — E875 Hyperkalemia: Secondary | ICD-10-CM | POA: Diagnosis not present

## 2019-08-21 DIAGNOSIS — M6281 Muscle weakness (generalized): Secondary | ICD-10-CM | POA: Diagnosis not present

## 2019-08-21 DIAGNOSIS — D631 Anemia in chronic kidney disease: Secondary | ICD-10-CM | POA: Diagnosis not present

## 2019-08-22 ENCOUNTER — Ambulatory Visit: Payer: Medicare Other | Admitting: Family

## 2019-08-23 DIAGNOSIS — Z992 Dependence on renal dialysis: Secondary | ICD-10-CM | POA: Diagnosis not present

## 2019-08-23 DIAGNOSIS — N186 End stage renal disease: Secondary | ICD-10-CM | POA: Diagnosis not present

## 2019-08-23 DIAGNOSIS — I951 Orthostatic hypotension: Secondary | ICD-10-CM | POA: Diagnosis not present

## 2019-08-23 DIAGNOSIS — M159 Polyosteoarthritis, unspecified: Secondary | ICD-10-CM | POA: Diagnosis not present

## 2019-08-26 DIAGNOSIS — E875 Hyperkalemia: Secondary | ICD-10-CM | POA: Diagnosis not present

## 2019-08-26 DIAGNOSIS — D689 Coagulation defect, unspecified: Secondary | ICD-10-CM | POA: Diagnosis not present

## 2019-08-26 DIAGNOSIS — R52 Pain, unspecified: Secondary | ICD-10-CM | POA: Diagnosis not present

## 2019-08-26 DIAGNOSIS — E1121 Type 2 diabetes mellitus with diabetic nephropathy: Secondary | ICD-10-CM | POA: Diagnosis not present

## 2019-08-26 DIAGNOSIS — D631 Anemia in chronic kidney disease: Secondary | ICD-10-CM | POA: Diagnosis not present

## 2019-08-26 DIAGNOSIS — D509 Iron deficiency anemia, unspecified: Secondary | ICD-10-CM | POA: Diagnosis not present

## 2019-08-26 DIAGNOSIS — N186 End stage renal disease: Secondary | ICD-10-CM | POA: Diagnosis not present

## 2019-08-26 DIAGNOSIS — Z992 Dependence on renal dialysis: Secondary | ICD-10-CM | POA: Diagnosis not present

## 2019-08-26 DIAGNOSIS — N2581 Secondary hyperparathyroidism of renal origin: Secondary | ICD-10-CM | POA: Diagnosis not present

## 2019-08-28 DIAGNOSIS — D689 Coagulation defect, unspecified: Secondary | ICD-10-CM | POA: Diagnosis not present

## 2019-08-28 DIAGNOSIS — N186 End stage renal disease: Secondary | ICD-10-CM | POA: Diagnosis not present

## 2019-08-28 DIAGNOSIS — N2581 Secondary hyperparathyroidism of renal origin: Secondary | ICD-10-CM | POA: Diagnosis not present

## 2019-08-28 DIAGNOSIS — R52 Pain, unspecified: Secondary | ICD-10-CM | POA: Diagnosis not present

## 2019-08-28 DIAGNOSIS — D631 Anemia in chronic kidney disease: Secondary | ICD-10-CM | POA: Diagnosis not present

## 2019-08-28 DIAGNOSIS — D509 Iron deficiency anemia, unspecified: Secondary | ICD-10-CM | POA: Diagnosis not present

## 2019-08-28 DIAGNOSIS — Z992 Dependence on renal dialysis: Secondary | ICD-10-CM | POA: Diagnosis not present

## 2019-08-28 DIAGNOSIS — E875 Hyperkalemia: Secondary | ICD-10-CM | POA: Diagnosis not present

## 2019-08-28 DIAGNOSIS — E1121 Type 2 diabetes mellitus with diabetic nephropathy: Secondary | ICD-10-CM | POA: Diagnosis not present

## 2019-08-29 ENCOUNTER — Ambulatory Visit: Payer: Medicare Other | Admitting: Family

## 2019-08-30 DIAGNOSIS — N2581 Secondary hyperparathyroidism of renal origin: Secondary | ICD-10-CM | POA: Diagnosis not present

## 2019-08-30 DIAGNOSIS — D631 Anemia in chronic kidney disease: Secondary | ICD-10-CM | POA: Diagnosis not present

## 2019-08-30 DIAGNOSIS — N186 End stage renal disease: Secondary | ICD-10-CM | POA: Diagnosis not present

## 2019-08-30 DIAGNOSIS — E875 Hyperkalemia: Secondary | ICD-10-CM | POA: Diagnosis not present

## 2019-08-30 DIAGNOSIS — E1121 Type 2 diabetes mellitus with diabetic nephropathy: Secondary | ICD-10-CM | POA: Diagnosis not present

## 2019-08-30 DIAGNOSIS — Z992 Dependence on renal dialysis: Secondary | ICD-10-CM | POA: Diagnosis not present

## 2019-08-30 DIAGNOSIS — R52 Pain, unspecified: Secondary | ICD-10-CM | POA: Diagnosis not present

## 2019-08-30 DIAGNOSIS — D689 Coagulation defect, unspecified: Secondary | ICD-10-CM | POA: Diagnosis not present

## 2019-08-30 DIAGNOSIS — D509 Iron deficiency anemia, unspecified: Secondary | ICD-10-CM | POA: Diagnosis not present

## 2019-09-01 DIAGNOSIS — D689 Coagulation defect, unspecified: Secondary | ICD-10-CM | POA: Diagnosis not present

## 2019-09-01 DIAGNOSIS — N2581 Secondary hyperparathyroidism of renal origin: Secondary | ICD-10-CM | POA: Diagnosis not present

## 2019-09-01 DIAGNOSIS — Z992 Dependence on renal dialysis: Secondary | ICD-10-CM | POA: Diagnosis not present

## 2019-09-01 DIAGNOSIS — R52 Pain, unspecified: Secondary | ICD-10-CM | POA: Diagnosis not present

## 2019-09-01 DIAGNOSIS — N186 End stage renal disease: Secondary | ICD-10-CM | POA: Diagnosis not present

## 2019-09-01 DIAGNOSIS — E875 Hyperkalemia: Secondary | ICD-10-CM | POA: Diagnosis not present

## 2019-09-01 DIAGNOSIS — D509 Iron deficiency anemia, unspecified: Secondary | ICD-10-CM | POA: Diagnosis not present

## 2019-09-01 DIAGNOSIS — E1121 Type 2 diabetes mellitus with diabetic nephropathy: Secondary | ICD-10-CM | POA: Diagnosis not present

## 2019-09-01 DIAGNOSIS — D631 Anemia in chronic kidney disease: Secondary | ICD-10-CM | POA: Diagnosis not present

## 2019-09-04 DIAGNOSIS — E1121 Type 2 diabetes mellitus with diabetic nephropathy: Secondary | ICD-10-CM | POA: Diagnosis not present

## 2019-09-04 DIAGNOSIS — Z992 Dependence on renal dialysis: Secondary | ICD-10-CM | POA: Diagnosis not present

## 2019-09-04 DIAGNOSIS — D631 Anemia in chronic kidney disease: Secondary | ICD-10-CM | POA: Diagnosis not present

## 2019-09-04 DIAGNOSIS — N2581 Secondary hyperparathyroidism of renal origin: Secondary | ICD-10-CM | POA: Diagnosis not present

## 2019-09-04 DIAGNOSIS — E875 Hyperkalemia: Secondary | ICD-10-CM | POA: Diagnosis not present

## 2019-09-04 DIAGNOSIS — N186 End stage renal disease: Secondary | ICD-10-CM | POA: Diagnosis not present

## 2019-09-04 DIAGNOSIS — D689 Coagulation defect, unspecified: Secondary | ICD-10-CM | POA: Diagnosis not present

## 2019-09-04 DIAGNOSIS — R52 Pain, unspecified: Secondary | ICD-10-CM | POA: Diagnosis not present

## 2019-09-04 DIAGNOSIS — D509 Iron deficiency anemia, unspecified: Secondary | ICD-10-CM | POA: Diagnosis not present

## 2019-09-06 DIAGNOSIS — D509 Iron deficiency anemia, unspecified: Secondary | ICD-10-CM | POA: Diagnosis not present

## 2019-09-06 DIAGNOSIS — Z992 Dependence on renal dialysis: Secondary | ICD-10-CM | POA: Diagnosis not present

## 2019-09-06 DIAGNOSIS — E875 Hyperkalemia: Secondary | ICD-10-CM | POA: Diagnosis not present

## 2019-09-06 DIAGNOSIS — R52 Pain, unspecified: Secondary | ICD-10-CM | POA: Diagnosis not present

## 2019-09-06 DIAGNOSIS — E1121 Type 2 diabetes mellitus with diabetic nephropathy: Secondary | ICD-10-CM | POA: Diagnosis not present

## 2019-09-06 DIAGNOSIS — N2581 Secondary hyperparathyroidism of renal origin: Secondary | ICD-10-CM | POA: Diagnosis not present

## 2019-09-06 DIAGNOSIS — N186 End stage renal disease: Secondary | ICD-10-CM | POA: Diagnosis not present

## 2019-09-06 DIAGNOSIS — D631 Anemia in chronic kidney disease: Secondary | ICD-10-CM | POA: Diagnosis not present

## 2019-09-06 DIAGNOSIS — D689 Coagulation defect, unspecified: Secondary | ICD-10-CM | POA: Diagnosis not present

## 2019-09-08 DIAGNOSIS — N186 End stage renal disease: Secondary | ICD-10-CM | POA: Diagnosis not present

## 2019-09-08 DIAGNOSIS — E875 Hyperkalemia: Secondary | ICD-10-CM | POA: Diagnosis not present

## 2019-09-08 DIAGNOSIS — D689 Coagulation defect, unspecified: Secondary | ICD-10-CM | POA: Diagnosis not present

## 2019-09-08 DIAGNOSIS — E1121 Type 2 diabetes mellitus with diabetic nephropathy: Secondary | ICD-10-CM | POA: Diagnosis not present

## 2019-09-08 DIAGNOSIS — N2581 Secondary hyperparathyroidism of renal origin: Secondary | ICD-10-CM | POA: Diagnosis not present

## 2019-09-08 DIAGNOSIS — R52 Pain, unspecified: Secondary | ICD-10-CM | POA: Diagnosis not present

## 2019-09-08 DIAGNOSIS — D631 Anemia in chronic kidney disease: Secondary | ICD-10-CM | POA: Diagnosis not present

## 2019-09-08 DIAGNOSIS — D509 Iron deficiency anemia, unspecified: Secondary | ICD-10-CM | POA: Diagnosis not present

## 2019-09-08 DIAGNOSIS — Z992 Dependence on renal dialysis: Secondary | ICD-10-CM | POA: Diagnosis not present

## 2019-09-11 DIAGNOSIS — E1121 Type 2 diabetes mellitus with diabetic nephropathy: Secondary | ICD-10-CM | POA: Diagnosis not present

## 2019-09-11 DIAGNOSIS — N186 End stage renal disease: Secondary | ICD-10-CM | POA: Diagnosis not present

## 2019-09-11 DIAGNOSIS — R52 Pain, unspecified: Secondary | ICD-10-CM | POA: Diagnosis not present

## 2019-09-11 DIAGNOSIS — D689 Coagulation defect, unspecified: Secondary | ICD-10-CM | POA: Diagnosis not present

## 2019-09-11 DIAGNOSIS — E875 Hyperkalemia: Secondary | ICD-10-CM | POA: Diagnosis not present

## 2019-09-11 DIAGNOSIS — N2581 Secondary hyperparathyroidism of renal origin: Secondary | ICD-10-CM | POA: Diagnosis not present

## 2019-09-11 DIAGNOSIS — Z992 Dependence on renal dialysis: Secondary | ICD-10-CM | POA: Diagnosis not present

## 2019-09-11 DIAGNOSIS — D509 Iron deficiency anemia, unspecified: Secondary | ICD-10-CM | POA: Diagnosis not present

## 2019-09-11 DIAGNOSIS — D631 Anemia in chronic kidney disease: Secondary | ICD-10-CM | POA: Diagnosis not present

## 2019-09-15 DIAGNOSIS — D689 Coagulation defect, unspecified: Secondary | ICD-10-CM | POA: Diagnosis not present

## 2019-09-15 DIAGNOSIS — D509 Iron deficiency anemia, unspecified: Secondary | ICD-10-CM | POA: Diagnosis not present

## 2019-09-15 DIAGNOSIS — E875 Hyperkalemia: Secondary | ICD-10-CM | POA: Diagnosis not present

## 2019-09-15 DIAGNOSIS — N186 End stage renal disease: Secondary | ICD-10-CM | POA: Diagnosis not present

## 2019-09-15 DIAGNOSIS — R52 Pain, unspecified: Secondary | ICD-10-CM | POA: Diagnosis not present

## 2019-09-15 DIAGNOSIS — E1121 Type 2 diabetes mellitus with diabetic nephropathy: Secondary | ICD-10-CM | POA: Diagnosis not present

## 2019-09-15 DIAGNOSIS — Z992 Dependence on renal dialysis: Secondary | ICD-10-CM | POA: Diagnosis not present

## 2019-09-15 DIAGNOSIS — D631 Anemia in chronic kidney disease: Secondary | ICD-10-CM | POA: Diagnosis not present

## 2019-09-15 DIAGNOSIS — N2581 Secondary hyperparathyroidism of renal origin: Secondary | ICD-10-CM | POA: Diagnosis not present

## 2019-09-17 DIAGNOSIS — Z992 Dependence on renal dialysis: Secondary | ICD-10-CM | POA: Diagnosis not present

## 2019-09-17 DIAGNOSIS — E1122 Type 2 diabetes mellitus with diabetic chronic kidney disease: Secondary | ICD-10-CM | POA: Diagnosis not present

## 2019-09-17 DIAGNOSIS — N186 End stage renal disease: Secondary | ICD-10-CM | POA: Diagnosis not present

## 2019-09-18 DIAGNOSIS — G8929 Other chronic pain: Secondary | ICD-10-CM | POA: Diagnosis not present

## 2019-09-18 DIAGNOSIS — M6281 Muscle weakness (generalized): Secondary | ICD-10-CM | POA: Diagnosis not present

## 2019-09-18 DIAGNOSIS — Z89512 Acquired absence of left leg below knee: Secondary | ICD-10-CM | POA: Diagnosis not present

## 2019-09-18 DIAGNOSIS — D509 Iron deficiency anemia, unspecified: Secondary | ICD-10-CM | POA: Diagnosis not present

## 2019-09-18 DIAGNOSIS — R52 Pain, unspecified: Secondary | ICD-10-CM | POA: Diagnosis not present

## 2019-09-18 DIAGNOSIS — Z992 Dependence on renal dialysis: Secondary | ICD-10-CM | POA: Diagnosis not present

## 2019-09-18 DIAGNOSIS — R262 Difficulty in walking, not elsewhere classified: Secondary | ICD-10-CM | POA: Diagnosis not present

## 2019-09-18 DIAGNOSIS — N2581 Secondary hyperparathyroidism of renal origin: Secondary | ICD-10-CM | POA: Diagnosis not present

## 2019-09-18 DIAGNOSIS — R269 Unspecified abnormalities of gait and mobility: Secondary | ICD-10-CM | POA: Diagnosis not present

## 2019-09-18 DIAGNOSIS — D631 Anemia in chronic kidney disease: Secondary | ICD-10-CM | POA: Diagnosis not present

## 2019-09-18 DIAGNOSIS — D689 Coagulation defect, unspecified: Secondary | ICD-10-CM | POA: Diagnosis not present

## 2019-09-18 DIAGNOSIS — N186 End stage renal disease: Secondary | ICD-10-CM | POA: Diagnosis not present

## 2019-09-18 DIAGNOSIS — E1121 Type 2 diabetes mellitus with diabetic nephropathy: Secondary | ICD-10-CM | POA: Diagnosis not present

## 2019-09-18 DIAGNOSIS — E875 Hyperkalemia: Secondary | ICD-10-CM | POA: Diagnosis not present

## 2019-09-20 DIAGNOSIS — D631 Anemia in chronic kidney disease: Secondary | ICD-10-CM | POA: Diagnosis not present

## 2019-09-20 DIAGNOSIS — D689 Coagulation defect, unspecified: Secondary | ICD-10-CM | POA: Diagnosis not present

## 2019-09-20 DIAGNOSIS — D509 Iron deficiency anemia, unspecified: Secondary | ICD-10-CM | POA: Diagnosis not present

## 2019-09-20 DIAGNOSIS — E1121 Type 2 diabetes mellitus with diabetic nephropathy: Secondary | ICD-10-CM | POA: Diagnosis not present

## 2019-09-20 DIAGNOSIS — N2581 Secondary hyperparathyroidism of renal origin: Secondary | ICD-10-CM | POA: Diagnosis not present

## 2019-09-20 DIAGNOSIS — N186 End stage renal disease: Secondary | ICD-10-CM | POA: Diagnosis not present

## 2019-09-20 DIAGNOSIS — R52 Pain, unspecified: Secondary | ICD-10-CM | POA: Diagnosis not present

## 2019-09-20 DIAGNOSIS — Z992 Dependence on renal dialysis: Secondary | ICD-10-CM | POA: Diagnosis not present

## 2019-09-20 DIAGNOSIS — E875 Hyperkalemia: Secondary | ICD-10-CM | POA: Diagnosis not present

## 2019-09-21 DIAGNOSIS — I951 Orthostatic hypotension: Secondary | ICD-10-CM | POA: Diagnosis not present

## 2019-09-21 DIAGNOSIS — E118 Type 2 diabetes mellitus with unspecified complications: Secondary | ICD-10-CM | POA: Diagnosis not present

## 2019-09-21 DIAGNOSIS — N186 End stage renal disease: Secondary | ICD-10-CM | POA: Diagnosis not present

## 2019-09-21 DIAGNOSIS — M159 Polyosteoarthritis, unspecified: Secondary | ICD-10-CM | POA: Diagnosis not present

## 2019-09-22 DIAGNOSIS — D509 Iron deficiency anemia, unspecified: Secondary | ICD-10-CM | POA: Diagnosis not present

## 2019-09-22 DIAGNOSIS — E1121 Type 2 diabetes mellitus with diabetic nephropathy: Secondary | ICD-10-CM | POA: Diagnosis not present

## 2019-09-22 DIAGNOSIS — D631 Anemia in chronic kidney disease: Secondary | ICD-10-CM | POA: Diagnosis not present

## 2019-09-22 DIAGNOSIS — D689 Coagulation defect, unspecified: Secondary | ICD-10-CM | POA: Diagnosis not present

## 2019-09-22 DIAGNOSIS — N186 End stage renal disease: Secondary | ICD-10-CM | POA: Diagnosis not present

## 2019-09-22 DIAGNOSIS — R52 Pain, unspecified: Secondary | ICD-10-CM | POA: Diagnosis not present

## 2019-09-22 DIAGNOSIS — E875 Hyperkalemia: Secondary | ICD-10-CM | POA: Diagnosis not present

## 2019-09-22 DIAGNOSIS — Z992 Dependence on renal dialysis: Secondary | ICD-10-CM | POA: Diagnosis not present

## 2019-09-22 DIAGNOSIS — N2581 Secondary hyperparathyroidism of renal origin: Secondary | ICD-10-CM | POA: Diagnosis not present

## 2019-09-25 DIAGNOSIS — R52 Pain, unspecified: Secondary | ICD-10-CM | POA: Diagnosis not present

## 2019-09-25 DIAGNOSIS — D509 Iron deficiency anemia, unspecified: Secondary | ICD-10-CM | POA: Diagnosis not present

## 2019-09-25 DIAGNOSIS — N2581 Secondary hyperparathyroidism of renal origin: Secondary | ICD-10-CM | POA: Diagnosis not present

## 2019-09-25 DIAGNOSIS — D689 Coagulation defect, unspecified: Secondary | ICD-10-CM | POA: Diagnosis not present

## 2019-09-25 DIAGNOSIS — N186 End stage renal disease: Secondary | ICD-10-CM | POA: Diagnosis not present

## 2019-09-25 DIAGNOSIS — E1121 Type 2 diabetes mellitus with diabetic nephropathy: Secondary | ICD-10-CM | POA: Diagnosis not present

## 2019-09-25 DIAGNOSIS — Z992 Dependence on renal dialysis: Secondary | ICD-10-CM | POA: Diagnosis not present

## 2019-09-25 DIAGNOSIS — D631 Anemia in chronic kidney disease: Secondary | ICD-10-CM | POA: Diagnosis not present

## 2019-09-25 DIAGNOSIS — E875 Hyperkalemia: Secondary | ICD-10-CM | POA: Diagnosis not present

## 2019-09-26 ENCOUNTER — Other Ambulatory Visit: Payer: Self-pay

## 2019-09-26 NOTE — Patient Outreach (Signed)
[facility]) Care Management  09/26/2019  Devun Anna 08-08-1970 [id number]   Medication Adherence call to Mr. Jessee Mezera HIPPA Compliant Voice message left with a call back number. Mr. Demarest is showing past due on Atorvastatin 40 mg under [place].   [facility] Management Direct Dial [phone]  Fax [phone] [email or web address]

## 2019-09-27 DIAGNOSIS — D689 Coagulation defect, unspecified: Secondary | ICD-10-CM | POA: Diagnosis not present

## 2019-09-27 DIAGNOSIS — N2581 Secondary hyperparathyroidism of renal origin: Secondary | ICD-10-CM | POA: Diagnosis not present

## 2019-09-27 DIAGNOSIS — N186 End stage renal disease: Secondary | ICD-10-CM | POA: Diagnosis not present

## 2019-09-27 DIAGNOSIS — E875 Hyperkalemia: Secondary | ICD-10-CM | POA: Diagnosis not present

## 2019-09-27 DIAGNOSIS — D509 Iron deficiency anemia, unspecified: Secondary | ICD-10-CM | POA: Diagnosis not present

## 2019-09-27 DIAGNOSIS — Z992 Dependence on renal dialysis: Secondary | ICD-10-CM | POA: Diagnosis not present

## 2019-09-27 DIAGNOSIS — E1121 Type 2 diabetes mellitus with diabetic nephropathy: Secondary | ICD-10-CM | POA: Diagnosis not present

## 2019-09-27 DIAGNOSIS — R52 Pain, unspecified: Secondary | ICD-10-CM | POA: Diagnosis not present

## 2019-09-27 DIAGNOSIS — D631 Anemia in chronic kidney disease: Secondary | ICD-10-CM | POA: Diagnosis not present

## 2019-09-29 DIAGNOSIS — D509 Iron deficiency anemia, unspecified: Secondary | ICD-10-CM | POA: Diagnosis not present

## 2019-09-29 DIAGNOSIS — R52 Pain, unspecified: Secondary | ICD-10-CM | POA: Diagnosis not present

## 2019-09-29 DIAGNOSIS — N186 End stage renal disease: Secondary | ICD-10-CM | POA: Diagnosis not present

## 2019-09-29 DIAGNOSIS — Z992 Dependence on renal dialysis: Secondary | ICD-10-CM | POA: Diagnosis not present

## 2019-09-29 DIAGNOSIS — E1121 Type 2 diabetes mellitus with diabetic nephropathy: Secondary | ICD-10-CM | POA: Diagnosis not present

## 2019-09-29 DIAGNOSIS — N2581 Secondary hyperparathyroidism of renal origin: Secondary | ICD-10-CM | POA: Diagnosis not present

## 2019-09-29 DIAGNOSIS — D631 Anemia in chronic kidney disease: Secondary | ICD-10-CM | POA: Diagnosis not present

## 2019-09-29 DIAGNOSIS — E875 Hyperkalemia: Secondary | ICD-10-CM | POA: Diagnosis not present

## 2019-09-29 DIAGNOSIS — D689 Coagulation defect, unspecified: Secondary | ICD-10-CM | POA: Diagnosis not present

## 2019-10-02 DIAGNOSIS — D631 Anemia in chronic kidney disease: Secondary | ICD-10-CM | POA: Diagnosis not present

## 2019-10-02 DIAGNOSIS — D689 Coagulation defect, unspecified: Secondary | ICD-10-CM | POA: Diagnosis not present

## 2019-10-02 DIAGNOSIS — N186 End stage renal disease: Secondary | ICD-10-CM | POA: Diagnosis not present

## 2019-10-02 DIAGNOSIS — E875 Hyperkalemia: Secondary | ICD-10-CM | POA: Diagnosis not present

## 2019-10-02 DIAGNOSIS — N2581 Secondary hyperparathyroidism of renal origin: Secondary | ICD-10-CM | POA: Diagnosis not present

## 2019-10-02 DIAGNOSIS — R52 Pain, unspecified: Secondary | ICD-10-CM | POA: Diagnosis not present

## 2019-10-02 DIAGNOSIS — Z992 Dependence on renal dialysis: Secondary | ICD-10-CM | POA: Diagnosis not present

## 2019-10-02 DIAGNOSIS — D509 Iron deficiency anemia, unspecified: Secondary | ICD-10-CM | POA: Diagnosis not present

## 2019-10-02 DIAGNOSIS — E1121 Type 2 diabetes mellitus with diabetic nephropathy: Secondary | ICD-10-CM | POA: Diagnosis not present

## 2019-10-02 NOTE — Progress Notes (Signed)
Entered pts chart for [employer].

## 2019-10-03 ENCOUNTER — Ambulatory Visit: Payer: Medicare Other | Admitting: Cardiology

## 2019-10-03 DIAGNOSIS — T7840XA Allergy, unspecified, initial encounter: Secondary | ICD-10-CM | POA: Insufficient documentation

## 2019-10-04 DIAGNOSIS — E1121 Type 2 diabetes mellitus with diabetic nephropathy: Secondary | ICD-10-CM | POA: Diagnosis not present

## 2019-10-04 DIAGNOSIS — D689 Coagulation defect, unspecified: Secondary | ICD-10-CM | POA: Diagnosis not present

## 2019-10-04 DIAGNOSIS — D509 Iron deficiency anemia, unspecified: Secondary | ICD-10-CM | POA: Diagnosis not present

## 2019-10-04 DIAGNOSIS — N2581 Secondary hyperparathyroidism of renal origin: Secondary | ICD-10-CM | POA: Diagnosis not present

## 2019-10-04 DIAGNOSIS — D631 Anemia in chronic kidney disease: Secondary | ICD-10-CM | POA: Diagnosis not present

## 2019-10-04 DIAGNOSIS — N186 End stage renal disease: Secondary | ICD-10-CM | POA: Diagnosis not present

## 2019-10-04 DIAGNOSIS — R52 Pain, unspecified: Secondary | ICD-10-CM | POA: Diagnosis not present

## 2019-10-04 DIAGNOSIS — Z992 Dependence on renal dialysis: Secondary | ICD-10-CM | POA: Diagnosis not present

## 2019-10-04 DIAGNOSIS — E875 Hyperkalemia: Secondary | ICD-10-CM | POA: Diagnosis not present

## 2019-10-05 DIAGNOSIS — H43393 Other vitreous opacities, bilateral: Secondary | ICD-10-CM | POA: Diagnosis not present

## 2019-10-05 DIAGNOSIS — E119 Type 2 diabetes mellitus without complications: Secondary | ICD-10-CM | POA: Diagnosis not present

## 2019-10-05 DIAGNOSIS — H524 Presbyopia: Secondary | ICD-10-CM | POA: Diagnosis not present

## 2019-10-05 DIAGNOSIS — H2513 Age-related nuclear cataract, bilateral: Secondary | ICD-10-CM | POA: Diagnosis not present

## 2019-10-06 DIAGNOSIS — Z992 Dependence on renal dialysis: Secondary | ICD-10-CM | POA: Diagnosis not present

## 2019-10-06 DIAGNOSIS — E875 Hyperkalemia: Secondary | ICD-10-CM | POA: Diagnosis not present

## 2019-10-06 DIAGNOSIS — D631 Anemia in chronic kidney disease: Secondary | ICD-10-CM | POA: Diagnosis not present

## 2019-10-06 DIAGNOSIS — R52 Pain, unspecified: Secondary | ICD-10-CM | POA: Diagnosis not present

## 2019-10-06 DIAGNOSIS — E1121 Type 2 diabetes mellitus with diabetic nephropathy: Secondary | ICD-10-CM | POA: Diagnosis not present

## 2019-10-06 DIAGNOSIS — D689 Coagulation defect, unspecified: Secondary | ICD-10-CM | POA: Diagnosis not present

## 2019-10-06 DIAGNOSIS — N186 End stage renal disease: Secondary | ICD-10-CM | POA: Diagnosis not present

## 2019-10-06 DIAGNOSIS — D509 Iron deficiency anemia, unspecified: Secondary | ICD-10-CM | POA: Diagnosis not present

## 2019-10-06 DIAGNOSIS — N2581 Secondary hyperparathyroidism of renal origin: Secondary | ICD-10-CM | POA: Diagnosis not present

## 2019-10-10 ENCOUNTER — Other Ambulatory Visit: Payer: Self-pay

## 2019-10-10 ENCOUNTER — Observation Stay (HOSPITAL_COMMUNITY)
Admission: EM | Admit: 2019-10-10 | Discharge: 2019-10-12 | Disposition: A | Discharge status: Home / Self Care | Payer: Medicare Other | Attending: Internal Medicine | Admitting: Internal Medicine

## 2019-10-10 ENCOUNTER — Encounter (HOSPITAL_COMMUNITY): Payer: Self-pay | Admitting: Emergency Medicine

## 2019-10-10 DIAGNOSIS — E1122 Type 2 diabetes mellitus with diabetic chronic kidney disease: Secondary | ICD-10-CM | POA: Diagnosis not present

## 2019-10-10 DIAGNOSIS — I700 Atherosclerosis of aorta: Secondary | ICD-10-CM | POA: Diagnosis not present

## 2019-10-10 DIAGNOSIS — J90 Pleural effusion, not elsewhere classified: Secondary | ICD-10-CM | POA: Diagnosis not present

## 2019-10-10 DIAGNOSIS — R188 Other ascites: Secondary | ICD-10-CM | POA: Diagnosis not present

## 2019-10-10 DIAGNOSIS — E11649 Type 2 diabetes mellitus with hypoglycemia without coma: Secondary | ICD-10-CM | POA: Insufficient documentation

## 2019-10-10 DIAGNOSIS — J189 Pneumonia, unspecified organism: Secondary | ICD-10-CM | POA: Diagnosis present

## 2019-10-10 DIAGNOSIS — E785 Hyperlipidemia, unspecified: Secondary | ICD-10-CM | POA: Diagnosis not present

## 2019-10-10 DIAGNOSIS — R1012 Left upper quadrant pain: Secondary | ICD-10-CM | POA: Insufficient documentation

## 2019-10-10 DIAGNOSIS — Z20822 Contact with and (suspected) exposure to covid-19: Secondary | ICD-10-CM | POA: Insufficient documentation

## 2019-10-10 DIAGNOSIS — Z6831 Body mass index (BMI) 31.0-31.9, adult: Secondary | ICD-10-CM | POA: Insufficient documentation

## 2019-10-10 DIAGNOSIS — E669 Obesity, unspecified: Secondary | ICD-10-CM | POA: Insufficient documentation

## 2019-10-10 DIAGNOSIS — I1311 Hypertensive heart and chronic kidney disease without heart failure, with stage 5 chronic kidney disease, or end stage renal disease: Secondary | ICD-10-CM | POA: Insufficient documentation

## 2019-10-10 DIAGNOSIS — Z452 Encounter for adjustment and management of vascular access device: Secondary | ICD-10-CM | POA: Diagnosis not present

## 2019-10-10 DIAGNOSIS — E1151 Type 2 diabetes mellitus with diabetic peripheral angiopathy without gangrene: Secondary | ICD-10-CM | POA: Diagnosis not present

## 2019-10-10 DIAGNOSIS — D631 Anemia in chronic kidney disease: Secondary | ICD-10-CM | POA: Diagnosis not present

## 2019-10-10 DIAGNOSIS — N401 Enlarged prostate with lower urinary tract symptoms: Secondary | ICD-10-CM | POA: Insufficient documentation

## 2019-10-10 DIAGNOSIS — R338 Other retention of urine: Secondary | ICD-10-CM | POA: Insufficient documentation

## 2019-10-10 DIAGNOSIS — R0602 Shortness of breath: Secondary | ICD-10-CM | POA: Diagnosis not present

## 2019-10-10 DIAGNOSIS — Z992 Dependence on renal dialysis: Secondary | ICD-10-CM | POA: Insufficient documentation

## 2019-10-10 DIAGNOSIS — Z79891 Long term (current) use of opiate analgesic: Secondary | ICD-10-CM | POA: Insufficient documentation

## 2019-10-10 DIAGNOSIS — N186 End stage renal disease: Secondary | ICD-10-CM | POA: Insufficient documentation

## 2019-10-10 DIAGNOSIS — Z89512 Acquired absence of left leg below knee: Secondary | ICD-10-CM | POA: Diagnosis not present

## 2019-10-10 DIAGNOSIS — I951 Orthostatic hypotension: Secondary | ICD-10-CM | POA: Diagnosis not present

## 2019-10-10 DIAGNOSIS — R6883 Chills (without fever): Secondary | ICD-10-CM | POA: Diagnosis not present

## 2019-10-10 DIAGNOSIS — G4730 Sleep apnea, unspecified: Secondary | ICD-10-CM | POA: Insufficient documentation

## 2019-10-10 DIAGNOSIS — R519 Headache, unspecified: Secondary | ICD-10-CM | POA: Diagnosis not present

## 2019-10-10 DIAGNOSIS — K219 Gastro-esophageal reflux disease without esophagitis: Secondary | ICD-10-CM | POA: Insufficient documentation

## 2019-10-10 DIAGNOSIS — R5382 Chronic fatigue, unspecified: Secondary | ICD-10-CM | POA: Diagnosis not present

## 2019-10-10 DIAGNOSIS — R05 Cough: Secondary | ICD-10-CM | POA: Diagnosis not present

## 2019-10-10 DIAGNOSIS — W06XXXA Fall from bed, initial encounter: Secondary | ICD-10-CM | POA: Diagnosis not present

## 2019-10-10 DIAGNOSIS — B359 Dermatophytosis, unspecified: Secondary | ICD-10-CM | POA: Diagnosis not present

## 2019-10-10 DIAGNOSIS — Z9181 History of falling: Secondary | ICD-10-CM | POA: Insufficient documentation

## 2019-10-10 DIAGNOSIS — D638 Anemia in other chronic diseases classified elsewhere: Secondary | ICD-10-CM | POA: Diagnosis present

## 2019-10-10 DIAGNOSIS — I959 Hypotension, unspecified: Secondary | ICD-10-CM | POA: Diagnosis not present

## 2019-10-10 DIAGNOSIS — M6281 Muscle weakness (generalized): Secondary | ICD-10-CM | POA: Diagnosis not present

## 2019-10-10 DIAGNOSIS — M159 Polyosteoarthritis, unspecified: Secondary | ICD-10-CM | POA: Diagnosis not present

## 2019-10-10 DIAGNOSIS — Z79899 Other long term (current) drug therapy: Secondary | ICD-10-CM | POA: Insufficient documentation

## 2019-10-10 DIAGNOSIS — Z8673 Personal history of transient ischemic attack (TIA), and cerebral infarction without residual deficits: Secondary | ICD-10-CM | POA: Insufficient documentation

## 2019-10-10 DIAGNOSIS — Z87891 Personal history of nicotine dependence: Secondary | ICD-10-CM | POA: Insufficient documentation

## 2019-10-10 DIAGNOSIS — R509 Fever, unspecified: Secondary | ICD-10-CM | POA: Diagnosis not present

## 2019-10-10 DIAGNOSIS — Z833 Family history of diabetes mellitus: Secondary | ICD-10-CM | POA: Insufficient documentation

## 2019-10-10 DIAGNOSIS — R079 Chest pain, unspecified: Secondary | ICD-10-CM | POA: Diagnosis not present

## 2019-10-10 LAB — CBC WITH DIFFERENTIAL/PLATELET
Abs Immature Granulocytes: 0.04 10*3/uL (ref 0.00–0.07)
Basophils Absolute: 0.1 10*3/uL (ref 0.0–0.1)
Basophils Relative: 1 %
Eosinophils Absolute: 0.2 10*3/uL (ref 0.0–0.5)
Eosinophils Relative: 3 %
HCT: 35.3 % — ABNORMAL LOW (ref 39.0–52.0)
Hemoglobin: 11.1 g/dL — ABNORMAL LOW (ref 13.0–17.0)
Immature Granulocytes: 1 %
Lymphocytes Relative: 19 %
Lymphs Abs: 1.6 10*3/uL (ref 0.7–4.0)
MCH: 28.6 pg (ref 26.0–34.0)
MCHC: 31.4 g/dL (ref 30.0–36.0)
MCV: 91 fL (ref 80.0–100.0)
Monocytes Absolute: 0.7 10*3/uL (ref 0.1–1.0)
Monocytes Relative: 9 %
Neutro Abs: 5.7 10*3/uL (ref 1.7–7.7)
Neutrophils Relative %: 67 %
Platelets: 220 10*3/uL (ref 150–400)
RBC: 3.88 MIL/uL — ABNORMAL LOW (ref 4.22–5.81)
RDW: 17 % — ABNORMAL HIGH (ref 11.5–15.5)
WBC: 8.3 10*3/uL (ref 4.0–10.5)
nRBC: 0 % (ref 0.0–0.2)

## 2019-10-10 LAB — COMPREHENSIVE METABOLIC PANEL
ALT: 24 U/L (ref 0–44)
AST: 27 U/L (ref 15–41)
Albumin: 3.5 g/dL (ref 3.5–5.0)
Alkaline Phosphatase: 170 U/L — ABNORMAL HIGH (ref 38–126)
Anion gap: 18 — ABNORMAL HIGH (ref 5–15)
BUN: 78 mg/dL — ABNORMAL HIGH (ref 6–20)
CO2: 24 mmol/L (ref 22–32)
Calcium: 8.4 mg/dL — ABNORMAL LOW (ref 8.9–10.3)
Chloride: 93 mmol/L — ABNORMAL LOW (ref 98–111)
Creatinine, Ser: 10.76 mg/dL — ABNORMAL HIGH (ref 0.61–1.24)
GFR calc Af Amer: 6 mL/min — ABNORMAL LOW (ref >60)
GFR calc non Af Amer: 5 mL/min — ABNORMAL LOW (ref >60)
Glucose, Bld: 68 mg/dL — ABNORMAL LOW (ref 70–99)
Potassium: 4.9 mmol/L (ref 3.5–5.1)
Sodium: 135 mmol/L (ref 135–145)
Total Bilirubin: 1 mg/dL (ref 0.3–1.2)
Total Protein: 7.5 g/dL (ref 6.5–8.1)

## 2019-10-10 NOTE — ED Triage Notes (Signed)
Patient missed his hemodialysis this week , reports chills/diaphoresis with dry cough .

## 2019-10-11 ENCOUNTER — Emergency Department (HOSPITAL_COMMUNITY): Payer: Medicare Other

## 2019-10-11 DIAGNOSIS — E1151 Type 2 diabetes mellitus with diabetic peripheral angiopathy without gangrene: Secondary | ICD-10-CM | POA: Diagnosis not present

## 2019-10-11 DIAGNOSIS — J189 Pneumonia, unspecified organism: Secondary | ICD-10-CM | POA: Diagnosis not present

## 2019-10-11 DIAGNOSIS — Z992 Dependence on renal dialysis: Secondary | ICD-10-CM | POA: Diagnosis not present

## 2019-10-11 DIAGNOSIS — R079 Chest pain, unspecified: Secondary | ICD-10-CM | POA: Diagnosis not present

## 2019-10-11 DIAGNOSIS — R61 Generalized hyperhidrosis: Secondary | ICD-10-CM | POA: Insufficient documentation

## 2019-10-11 DIAGNOSIS — R0602 Shortness of breath: Secondary | ICD-10-CM | POA: Diagnosis not present

## 2019-10-11 DIAGNOSIS — N186 End stage renal disease: Secondary | ICD-10-CM | POA: Diagnosis not present

## 2019-10-11 LAB — CBC
HCT: 32.4 % — ABNORMAL LOW (ref 39.0–52.0)
Hemoglobin: 10.2 g/dL — ABNORMAL LOW (ref 13.0–17.0)
MCH: 28.2 pg (ref 26.0–34.0)
MCHC: 31.5 g/dL (ref 30.0–36.0)
MCV: 89.5 fL (ref 80.0–100.0)
Platelets: 201 10*3/uL (ref 150–400)
RBC: 3.62 MIL/uL — ABNORMAL LOW (ref 4.22–5.81)
RDW: 16.7 % — ABNORMAL HIGH (ref 11.5–15.5)
WBC: 6.8 10*3/uL (ref 4.0–10.5)
nRBC: 0 % (ref 0.0–0.2)

## 2019-10-11 LAB — GLUCOSE, CAPILLARY
Glucose-Capillary: 105 mg/dL — ABNORMAL HIGH (ref 70–99)
Glucose-Capillary: 118 mg/dL — ABNORMAL HIGH (ref 70–99)
Glucose-Capillary: 45 mg/dL — ABNORMAL LOW (ref 70–99)
Glucose-Capillary: 65 mg/dL — ABNORMAL LOW (ref 70–99)
Glucose-Capillary: 65 mg/dL — ABNORMAL LOW (ref 70–99)
Glucose-Capillary: 70 mg/dL (ref 70–99)
Glucose-Capillary: 80 mg/dL (ref 70–99)

## 2019-10-11 LAB — RENAL FUNCTION PANEL
Albumin: 3.2 g/dL — ABNORMAL LOW (ref 3.5–5.0)
Anion gap: 16 — ABNORMAL HIGH (ref 5–15)
BUN: 39 mg/dL — ABNORMAL HIGH (ref 6–20)
CO2: 25 mmol/L (ref 22–32)
Calcium: 7.9 mg/dL — ABNORMAL LOW (ref 8.9–10.3)
Chloride: 94 mmol/L — ABNORMAL LOW (ref 98–111)
Creatinine, Ser: 6.63 mg/dL — ABNORMAL HIGH (ref 0.61–1.24)
GFR calc Af Amer: 11 mL/min — ABNORMAL LOW (ref >60)
GFR calc non Af Amer: 9 mL/min — ABNORMAL LOW (ref >60)
Glucose, Bld: 70 mg/dL (ref 70–99)
Phosphorus: 2.9 mg/dL (ref 2.5–4.6)
Potassium: 3.5 mmol/L (ref 3.5–5.1)
Sodium: 135 mmol/L (ref 135–145)

## 2019-10-11 LAB — RESPIRATORY PANEL BY RT PCR (FLU A&B, COVID)
Influenza A by PCR: NEGATIVE
Influenza B by PCR: NEGATIVE
SARS Coronavirus 2 by RT PCR: NEGATIVE

## 2019-10-11 IMAGING — DX DG CHEST 1V PORT
1 series · 1 of 1 positions shown · non-contrast
Comparison: [DATE]

CLINICAL DATA: Shortness of breath, chest pain

EXAM:
PORTABLE CHEST 1 VIEW

[chest]
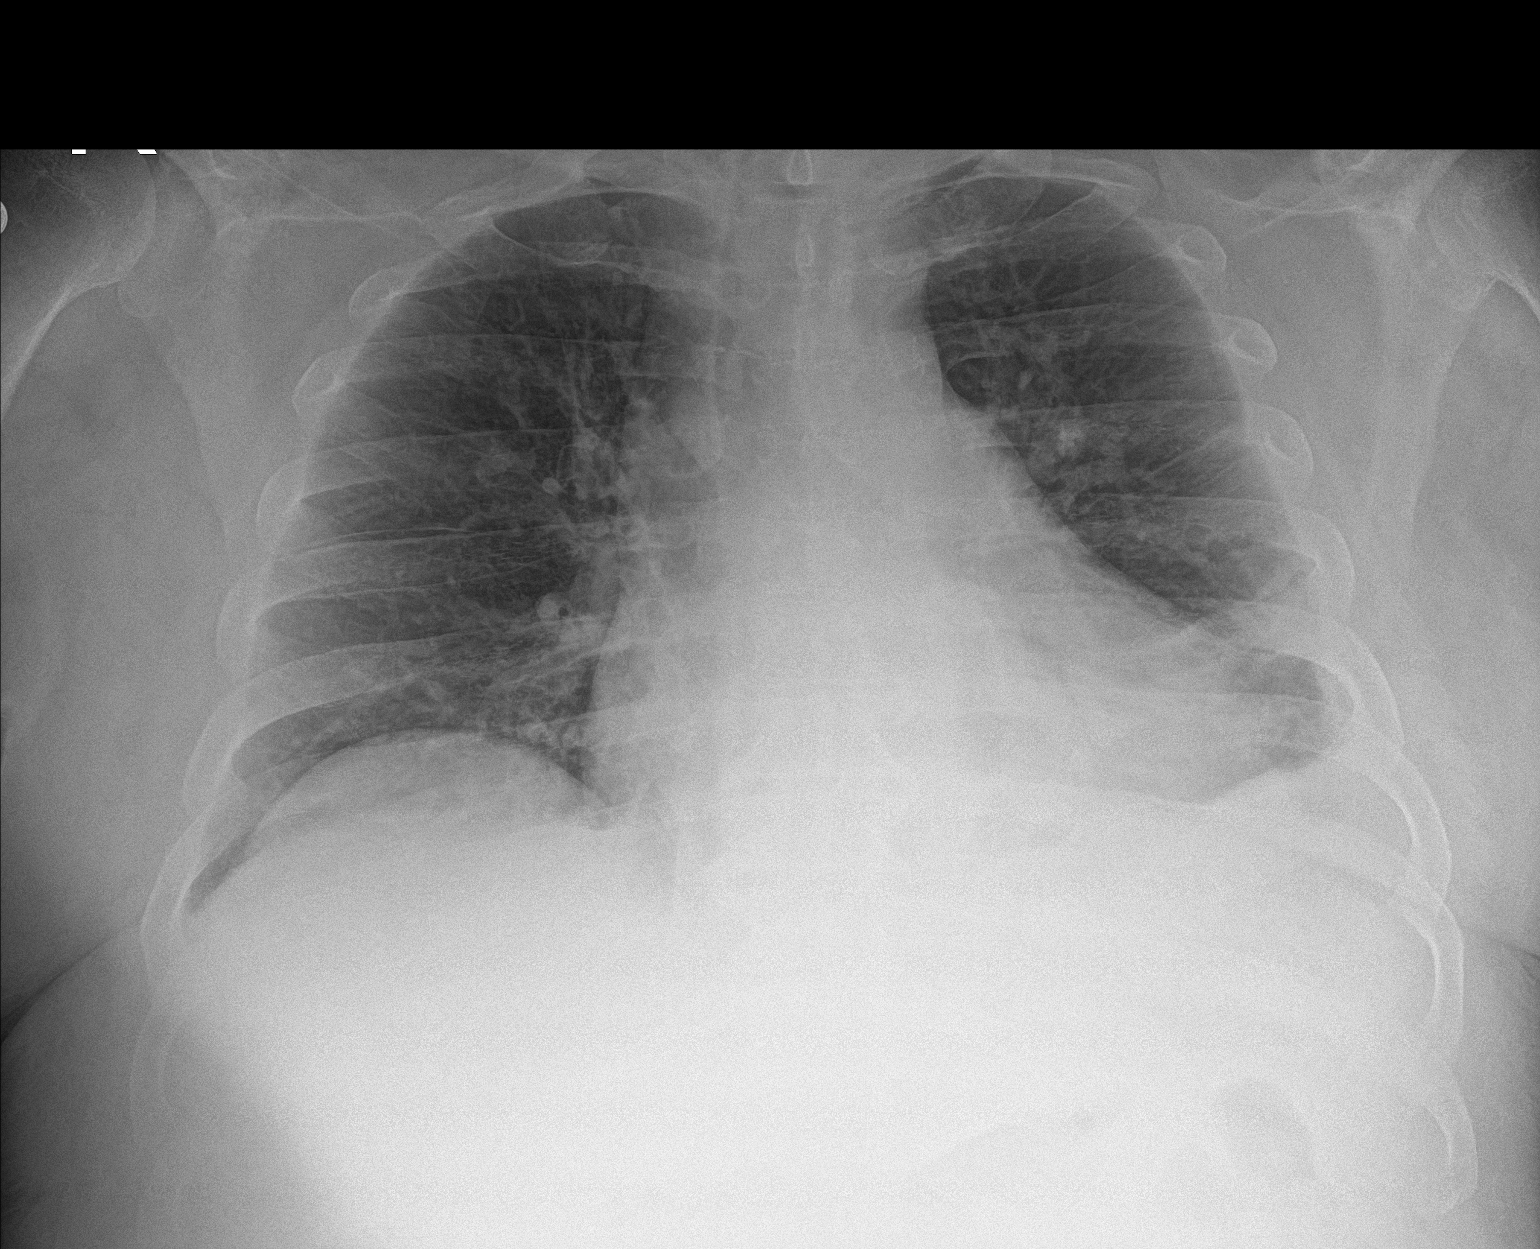

[1 of 1 positions shown; findings below may reference images not displayed]

FINDINGS: Small left pleural effusion. Cardiomegaly. Left base atelectasis or
infiltrate. Right lung clear. No acute bony abnormality.
IMPRESSION: Left base atelectasis or infiltrate with small left pleural
effusion.

Cardiomegaly.

## 2019-10-11 MED ORDER — DEXTROSE 50 % IV SOLN
25.0000 g | INTRAVENOUS | Status: AC
  Administered 2019-10-11: 25 g via INTRAVENOUS

## 2019-10-11 MED ORDER — SUCROFERRIC OXYHYDROXIDE 500 MG PO CHEW
1000.0000 mg | CHEWABLE_TABLET | Freq: Three times a day (TID) | ORAL | Status: DC
  Administered 2019-10-11 – 2019-10-12 (×3): 1000 mg via ORAL
  Filled 2019-10-11 (×3): qty 2

## 2019-10-11 MED ORDER — BUPROPION HCL ER (XL) 300 MG PO TB24
300.0000 mg | ORAL_TABLET | Freq: Every day | ORAL | Status: DC
  Administered 2019-10-11 – 2019-10-12 (×2): 300 mg via ORAL
  Filled 2019-10-11 (×2): qty 1

## 2019-10-11 MED ORDER — CHLORHEXIDINE GLUCONATE CLOTH 2 % EX PADS
6.0000 | MEDICATED_PAD | Freq: Every day | CUTANEOUS | Status: DC
  Administered 2019-10-11 – 2019-10-12 (×2): 6 via TOPICAL

## 2019-10-11 MED ORDER — ACETAMINOPHEN 650 MG RE SUPP: 650.0000 mg | Freq: Four times a day (QID) | RECTAL | Status: DC | PRN

## 2019-10-11 MED ORDER — SODIUM CHLORIDE 0.9 % IV SOLN
1.0000 g | Freq: Once | INTRAVENOUS | Status: AC
  Administered 2019-10-11: 1 g via INTRAVENOUS
  Filled 2019-10-11: qty 10

## 2019-10-11 MED ORDER — HYDRALAZINE HCL 20 MG/ML IJ SOLN: 10.0000 mg | INTRAMUSCULAR | Status: DC | PRN

## 2019-10-11 MED ORDER — ONDANSETRON HCL 4 MG PO TABS: 4.0000 mg | ORAL_TABLET | Freq: Four times a day (QID) | ORAL | Status: DC | PRN

## 2019-10-11 MED ORDER — INSULIN ASPART 100 UNIT/ML ~~LOC~~ SOLN: 0.0000 [IU] | Freq: Three times a day (TID) | SUBCUTANEOUS | Status: DC

## 2019-10-11 MED ORDER — ACETAMINOPHEN 325 MG PO TABS
650.0000 mg | ORAL_TABLET | Freq: Four times a day (QID) | ORAL | Status: DC | PRN
  Administered 2019-10-12: 650 mg via ORAL
  Filled 2019-10-11: qty 2

## 2019-10-11 MED ORDER — OXYCODONE HCL 5 MG PO TABS
5.0000 mg | ORAL_TABLET | Freq: Four times a day (QID) | ORAL | Status: DC | PRN
  Administered 2019-10-11 – 2019-10-12 (×4): 5 mg via ORAL
  Filled 2019-10-11 (×5): qty 1

## 2019-10-11 MED ORDER — RENA-VITE PO TABS
1.0000 | ORAL_TABLET | Freq: Every day | ORAL | Status: DC
  Administered 2019-10-11: 1 via ORAL
  Filled 2019-10-11: qty 1

## 2019-10-11 MED ORDER — SODIUM THIOSULFATE 25 % IV SOLN
25.0000 g | INTRAVENOUS | Status: DC
  Administered 2019-10-11: 25 g via INTRAVENOUS
  Filled 2019-10-11: qty 100

## 2019-10-11 MED ORDER — IPRATROPIUM-ALBUTEROL 0.5-2.5 (3) MG/3ML IN SOLN: 3.0000 mL | Freq: Four times a day (QID) | RESPIRATORY_TRACT | Status: DC | PRN

## 2019-10-11 MED ORDER — ONDANSETRON HCL 4 MG/2ML IJ SOLN
4.0000 mg | Freq: Four times a day (QID) | INTRAMUSCULAR | Status: DC | PRN
  Administered 2019-10-12: 4 mg via INTRAVENOUS
  Filled 2019-10-11: qty 2

## 2019-10-11 MED ORDER — SODIUM CHLORIDE 0.9 % IV SOLN
500.0000 mg | INTRAVENOUS | Status: DC
  Administered 2019-10-12: 500 mg via INTRAVENOUS
  Filled 2019-10-11: qty 500

## 2019-10-11 MED ORDER — AZITHROMYCIN 250 MG PO TABS
500.0000 mg | ORAL_TABLET | Freq: Once | ORAL | Status: AC
  Administered 2019-10-11: 04:00:00 500 mg via ORAL
  Filled 2019-10-11: qty 2

## 2019-10-11 MED ORDER — TRAMADOL HCL 50 MG PO TABS
50.0000 mg | ORAL_TABLET | Freq: Three times a day (TID) | ORAL | Status: DC | PRN
  Administered 2019-10-12: 50 mg via ORAL
  Filled 2019-10-11 (×2): qty 1

## 2019-10-11 MED ORDER — NEPRO/CARBSTEADY PO LIQD: 237.0000 mL | Freq: Two times a day (BID) | ORAL | Status: DC

## 2019-10-11 MED ORDER — DEXTROSE 50 % IV SOLN
INTRAVENOUS | Status: AC
  Filled 2019-10-11: qty 50

## 2019-10-11 MED ORDER — SODIUM CHLORIDE 0.9 % IV SOLN
2.0000 g | INTRAVENOUS | Status: DC
  Administered 2019-10-12: 2 g via INTRAVENOUS
  Filled 2019-10-11: qty 2

## 2019-10-11 NOTE — ED Notes (Signed)
Phoned report to receiving RN. Will arrange transport at this time.

## 2019-10-11 NOTE — ED Notes (Signed)
RT at bedside to place pt on CPAP.

## 2019-10-11 NOTE — Progress Notes (Addendum)
Patient placed in observation after midnight.  Patient came in from home with complaints of chills, cough and falls.  Yesterday patient had a dialysis catheter removed from his right upper chest per chart review. Patient given IV antibiotics for possible pneumonia Await blood cultures PT eval for falls Patient is also having issues with hypoglycemia--we will need to monitor closely as he is still n.p.o. for the CT scan.   Nephrology has been consulted for hemodialysis due today.  Michael Riggs

## 2019-10-11 NOTE — Progress Notes (Signed)
OT Cancellation Note  Patient Details Name: Zailen Albarran MRN: [id number] DOB: 1970-12-05   Cancelled Treatment:    Reason Eval/Treat Not Completed: Patient at procedure or test/ unavailable(HD)  [name] 10/11/2019, 3:43 PM  Nestor Lewandowsky, OTR/L Acute Rehabilitation Services Pager: [phone] Office: [phone]

## 2019-10-11 NOTE — Evaluation (Signed)
Physical Therapy Evaluation Patient Details Name: Michael Riggs MRN: [id number] DOB: Oct 26, 1970 Today's Date: 10/11/2019   History of Present Illness  Pt is a 49 y/o male admitted secondary to fall and CAP. PMH includes ESRD on HD MWF, DM, HTN, L BKA, and CVA.   Clinical Impression  Pt admitted secondary to problem above with deficits below. Requiring min A to stand and take hop steps at EOB this session. Educated about using prosthetic to improve safety and balance. Reports he plans to return home and does not want to go to SNF. Also would like a new WC, however, reports he does not want a manual one and wants a power WC. Educated to speak with primary MD about power WC recommendations. Will continue to follow acutely to maximize functional mobility independence and safety.     Follow Up Recommendations Home health PT;Supervision/Assistance - 24 hour(Refusing SNF )    Equipment Recommendations  None recommended by PT(Pt refusing manual WC; is wanting power WC )    Recommendations for Other Services OT consult     Precautions / Restrictions Precautions Precautions: Fall;Other (comment) Precaution Comments: L BKA Required Braces or Orthoses: Other Brace Other Brace: Reports he has prosthetic for L BKA Restrictions Weight Bearing Restrictions: No      Mobility  Bed Mobility Overal bed mobility: Needs Assistance Bed Mobility: Supine to Sit;Sit to Supine     Supine to sit: Supervision Sit to supine: Supervision   General bed mobility comments: Supervision for safety.   Transfers Overall transfer level: Needs assistance Equipment used: Rolling walker (2 wheeled) Transfers: Sit to/from Stand Sit to Stand: Min assist         General transfer comment: Min A for steadying assist. Pt placing L residual limb back on bed to help with standing. Educated about using prosthetic when performing transfers to increase safety. Performed hop steps towards HOB with min A for  steadying. Further mobility deferred, as pt going to HD.   Ambulation/Gait                Stairs            Wheelchair Mobility    Modified Rankin (Stroke Patients Only)       Balance Overall balance assessment: Needs assistance Sitting-balance support: No upper extremity supported;Feet supported Sitting balance-Leahy Scale: Good     Standing balance support: Bilateral upper extremity supported;During functional activity Standing balance-Leahy Scale: Poor Standing balance comment: Reliant on BUE suppor t                             Pertinent Vitals/Pain Pain Assessment: Faces Faces Pain Scale: Hurts even more Pain Location: L abdomen  Pain Descriptors / Indicators: Grimacing;Guarding Pain Intervention(s): Limited activity within patient's tolerance;Monitored during session;Repositioned    Home Living Family/patient expects to be discharged to:: Private residence Living Arrangements: Parent Available Help at Discharge: Family;Available PRN/intermittently Type of Home: Apartment Home Access: Level entry     Home Layout: One level Home Equipment: Wheelchair - [employer] - 2 wheels Additional Comments: Reports mother cannot physically assist. Reports his WC is broken, but is checking with MD about getting power WC.     Prior Function Level of Independence: Independent with assistive device(s)         Comments: Reports he was using RW to transfer into WC. Has not been able to walk      Hand Dominance  Extremity/Trunk Assessment   Upper Extremity Assessment Upper Extremity Assessment: Defer to OT evaluation    Lower Extremity Assessment Lower Extremity Assessment: RLE deficits/detail;LLE deficits/detail RLE Deficits / Details: Increased PF tightness noted. Pt with previous CVA, so likely baseline.  LLE Deficits / Details: L BKA     Cervical / Trunk Assessment Cervical / Trunk Assessment: Normal  Communication    Communication: No difficulties  Cognition Arousal/Alertness: Awake/alert Behavior During Therapy: WFL for tasks assessed/performed Overall Cognitive Status: History of cognitive impairments - at baseline                                 General Comments: Reports memory deficits since having CVA.       General Comments General comments (skin integrity, edema, etc.): Educated about manual WC, however, pt reports he wants a power WC and plans to speak with his primary.     Exercises Other Exercises Other Exercises: Educated about gastroc stretch using sheet/blanket. Educated to hold 30 seconds and repeat 3 times in a row. Educated to perform this multiple times throughout the day.    Assessment/Plan    PT Assessment Patient needs continued PT services  PT Problem List Decreased strength;Decreased range of motion;Decreased balance;Decreased mobility;Decreased knowledge of use of DME;Decreased cognition;Decreased knowledge of precautions       PT Treatment Interventions DME instruction;Functional mobility training;Therapeutic activities;Therapeutic exercise;Balance training;Neuromuscular re-education;Wheelchair mobility training;Patient/family education    PT Goals (Current goals can be found in the Care Plan section)  Acute Rehab PT Goals Patient Stated Goal: to go home PT Goal Formulation: With patient Time For Goal Achievement: 10/25/19 Potential to Achieve Goals: Good    Frequency Min 3X/week   Barriers to discharge        Co-evaluation               AM-PAC PT "6 Clicks" Mobility  Outcome Measure Help needed turning from your back to your side while in a flat bed without using bedrails?: None Help needed moving from lying on your back to sitting on the side of a flat bed without using bedrails?: None Help needed moving to and from a bed to a chair (including a wheelchair)?: A Little Help needed standing up from a chair using your arms (e.g., wheelchair  or bedside chair)?: A Little Help needed to walk in hospital room?: Total Help needed climbing 3-5 steps with a railing? : Total 6 Click Score: 16    End of Session Equipment Utilized During Treatment: Gait belt Activity Tolerance: Patient tolerated treatment well Patient left: in bed;with call bell/phone within reach;with bed alarm set Nurse Communication: Mobility status PT Visit Diagnosis: History of falling (Z91.81);Muscle weakness (generalized) (M62.81)    Time: 1497-0263 PT Time Calculation (min) (ACUTE ONLY): 27 min   Charges:   PT Evaluation $PT Eval Moderate Complexity: 1 Mod PT Treatments $Therapeutic Activity: 8-22 mins        Lou Miner, DPT  Acute Rehabilitation Services  Pager: [phone] Office: [phone]   Rudean Hitt 10/11/2019, 11:48 AM

## 2019-10-11 NOTE — H&P (Signed)
History and Physical    Michael Riggs NLG:[id number] DOB: 1970/10/31 DOA: 10/10/2019  PCP: [name], MD  Patient coming from: Home.  Chief Complaint: Chills and cough.  HPI: Michael Riggs is a 49 y.o. male with history of ESRD on hemodialysis on Monday Wednesday Friday, diabetes mellitus type 2 on chronic hypotension and anemia BPH left BKA presents to the ER complaints of having subjective feeling of chills and rigors over the last 1 week.  Has been having cough at times productive of sputum which is greenish in color.  No recorded fever.  2 days ago patient also had a fall when he was getting out of his bed and fell onto the floor hit his left side of the chest.  Has been having pain in the left upper quadrant.  Has not missed his dialysis.  Patient had his dialysis catheter removed yesterday from his right upper chest.  Left AV fistula does not look erythematous or indurated.  ED Course: In the ER patient was afebrile and not hypoxic.  Chest x-ray shows features concerning for early pneumonia on the left side with some pleural effusion.  On exam patient has left upper quadrant tenderness.  Patient states that this is where he got hit when he fell 2 days ago.  Covid test was negative.  Labs show potassium of 4.9 creatinine 10.7 glucose 68 hemoglobin 7.1 EKG shows normal sinus rhythm.  Review of Systems: As per HPI, rest all negative.   Past Medical History:  Diagnosis Date  . Anemia   . Anemia of chronic disease   . Anxiety   . ARF (acute renal failure) ([place]) 01/30/2019  . Calciphylaxis   . Controlled type 2 diabetes mellitus with hyperglycemia, without long-term current use of insulin ([place])   . Debility 02/09/2019  . Depression   . Diabetes mellitus type 2 in obese ([place]) 07/04/2013  . Diabetes mellitus with peripheral vascular disease (H[facility])    type 2 no meds, lost 100 lbs  . Dyslipidemia   . Dyspnea    inhaler  . End stage renal disease ([place])   . ESRD (end stage  renal disease) on dialysis ([place]) 01/2019   MWFS  . ESRD on dialysis ([place])   . Essential hypertension   . Fluid overload 02/04/2019  . GERD (gastroesophageal reflux disease)   . Goals of care, counseling/discussion   . Habitual alcohol use 07/04/2013  . Headache   . Hyperkalemia 01/30/2019  . Hypertension   . Hypoglycemia 01/30/2019  . Hypokalemia 01/30/2019  . Labile blood pressure   . Leukocytosis   . Lower GI bleed 05/14/2019  . Macrocytic anemia 01/30/2019  . Obesity, Class II, BMI 35-39.9, with comorbidity 07/04/2013  . Palliative care encounter   . PDR (proliferative diabetic retinopathy) ([place]) 12/14/2013  . Physical deconditioning   . Pressure injury of skin 04/27/2019  . Pruritus   . Scrotal edema   . Scrotal pain   . Sleep apnea    uses CPAP  . Sleep disturbance   . Slow transit constipation   . Status post below-knee amputation of left lower extremity ([place])   . Stroke [facility])    mini -shown on CT scan  . Syphilis 01/2019  . Urinary retention   . Venous hypertension 09/22/2013  . Wound infection 03/28/2019    Past Surgical History:  Procedure Laterality Date  . AV FISTULA PLACEMENT Left 01/09/2019   Procedure: ARTERIOVENOUS (AV) FISTULA CREATION LEFT UPPER ARM;  Surgeon: [name],  MD;  Location: [place];  Service: Vascular;  Laterality: Left;  . BASCILIC VEIN TRANSPOSITION Left 07/06/2019   Procedure: BASILIC VEIN TRANSPOSITION SECOND STAGE LEFT ARM;  Surgeon: [name], MD;  Location: [facility] OR;  Service: Vascular;  Laterality: Left;  . below the knee amputation Left   . EYE SURGERY Bilateral   . FLEXIBLE SIGMOIDOSCOPY N/A 05/15/2019   Procedure: FLEXIBLE SIGMOIDOSCOPY;  Surgeon: Rush Landmark Telford Nab., MD;  Location: [place];  Service: Gastroenterology;  Laterality: N/A;  . HEMOSTASIS CLIP PLACEMENT  05/15/2019   Procedure: HEMOSTASIS CLIP PLACEMENT;  Surgeon: Irving Copas., MD;  Location: [place];  Service: Gastroenterology;;  . HOT HEMOSTASIS  N/A 05/15/2019   Procedure: HOT HEMOSTASIS (ARGON PLASMA COAGULATION/BICAP);  Surgeon: Irving Copas., MD;  Location: [place];  Service: Gastroenterology;  Laterality: N/A;  . IR FLUORO GUIDE CV LINE RIGHT  01/31/2019  . IR FLUORO GUIDE CV LINE RIGHT  02/03/2019  . IR US GUIDE VASC ACCESS RIGHT  01/31/2019  . IR US GUIDE VASC ACCESS RIGHT  02/03/2019  . SCLEROTHERAPY  05/15/2019   Procedure: SCLEROTHERAPY;  Surgeon: Mansouraty, Telford Nab., MD;  Location: [place];  Service: Gastroenterology;;     reports that he quit smoking about 8 months ago. His smoking use included cigarettes. He started smoking about 10 months ago. He has never used smokeless tobacco. He reports previous alcohol use. He reports previous drug use.  No Known Allergies  Family History  Problem Relation Age of Onset  . Diabetes Mother   . Multiple myeloma Mother     Prior to Admission medications   Medication Sig Start Date End Date Taking? Authorizing Provider  acetaminophen (TYLENOL) 325 MG tablet Take 2 tablets (650 mg total) by mouth every 6 (six) hours as needed for mild pain (or Fever >/= 101). 03/13/19   Angiulli, Lavon Paganini, PA-C  albuterol (VENTOLIN HFA) 108 (90 Base) MCG/ACT inhaler Inhale 2 puffs into the lungs every 6 (six) hours as needed for wheezing or shortness of breath.    [provider]  amLODipine (NORVASC) 5 MG tablet Take 5 mg by mouth daily.    [provider]  atorvastatin (LIPITOR) 40 MG tablet Take 1 tablet (40 mg total) by mouth daily. 03/13/19   Angiulli, Lavon Paganini, PA-C  collagenase (SANTYL) ointment Apply 1 application topically daily. Apply to the affected area daily plus dry dressing 07/18/19   Suzan Slick, NP  Hydrocortisone (GERHARDT'S BUTT CREAM) CREA Apply 1 application topically 3 (three) times daily as needed for irritation. 04/27/19   Dessa Phi, DO  ipratropium-albuterol (DUONEB) 0.5-2.5 (3) MG/3ML SOLN Take 3 mLs by nebulization every 6 (six)  hours as needed (For shortness of breath).    [provider]  Melatonin 5 MG TABS Take 5 mg by mouth at bedtime as needed (sleep).    [provider]  midodrine (PROAMATINE) 10 MG tablet Take 10 mg by mouth daily.    [provider]  oxycodone (OXY-IR) 5 MG capsule Take 1 capsule (5 mg total) by mouth every 6 (six) hours as needed. 07/06/19   Dagoberto Ligas, PA-C  pantoprazole (PROTONIX) 40 MG tablet Take 1 tablet (40 mg total) by mouth daily. 03/13/19   Angiulli, Lavon Paganini, PA-C  tamsulosin (FLOMAX) 0.4 MG CAPS capsule Take 1 capsule (0.4 mg total) by mouth daily after breakfast. 03/13/19   Angiulli, Lavon Paganini, PA-C  traMADol (ULTRAM) 50 MG tablet Take 1 tablet (50 mg total) by mouth every 8 (  eight) hours as needed for moderate pain. 05/18/19   Mercy Riding, MD    Physical Exam: Constitutional: Moderately built and nourished. Vitals:   10/11/19 0430 10/11/19 0500 10/11/19 0529 10/11/19 0632  BP: 135/90 139/89  (!) 139/97  Pulse: 77  81 79  Resp: (!) _0 Temp:    98.1 F (36.7 C)  TempSrc:      SpO2: 91%  95%   Weight:    101.6 kg  Height:       Eyes: Anicteric no pallor. ENMT: No discharge from the ears eyes nose or mouth. Neck: No mass felt.  No neck rigidity. Respiratory: No rhonchi or crepitations. Cardiovascular: S1-S2 heard. Abdomen: Tenderness in left upper quadrant. Musculoskeletal: Left BKA. Skin: Chronic skin changes. Neurologic: Alert awake oriented to time place and person.  Moves all extremities. Psychiatric: Appears normal.   Labs on Admission: I have personally reviewed following labs and imaging studies  CBC: Recent Labs  Lab 10/10/19 2001  WBC 8.3  NEUTROABS 5.7  HGB 11.1*  HCT 35.3*  MCV 91.0  PLT 423   Basic Metabolic Panel: Recent Labs  Lab 10/10/19 2001  NA 135  K 4.9  CL 93*  CO2 24  GLUCOSE 68*  BUN 78*  CREATININE 10.76*  CALCIUM 8.4*   GFR: Estimated Creatinine Clearance: 10.1 mL/min (A) (by C-G  formula based on SCr of 10.76 mg/dL (H)). Liver Function Tests: Recent Labs  Lab 10/10/19 2001  AST 27  ALT 24  ALKPHOS 170*  BILITOT 1.0  PROT 7.5  ALBUMIN 3.5   No results for input(s): LIPASE, AMYLASE in the last 168 hours. No results for input(s): AMMONIA in the last 168 hours. Coagulation Profile: No results for input(s): INR, PROTIME in the last 168 hours. Cardiac Enzymes: No results for input(s): CKTOTAL, CKMB, CKMBINDEX, TROPONINI in the last 168 hours. BNP (last 3 results) No results for input(s): PROBNP in the last 8760 hours. HbA1C: No results for input(s): HGBA1C in the last 72 hours. CBG: No results for input(s): GLUCAP in the last 168 hours. Lipid Profile: No results for input(s): CHOL, HDL, LDLCALC, TRIG, CHOLHDL, LDLDIRECT in the last 72 hours. Thyroid Function Tests: No results for input(s): TSH, T4TOTAL, FREET4, T3FREE, THYROIDAB in the last 72 hours. Anemia Panel: No results for input(s): VITAMINB12, FOLATE, FERRITIN, TIBC, IRON, RETICCTPCT in the last 72 hours. Urine analysis:    Component Value Date/Time   COLORURINE YELLOW 03/10/2019 1054   APPEARANCEUR TURBID (A) 03/10/2019 1054   LABSPEC 1.020 03/10/2019 1054   PHURINE 5.0 03/10/2019 1054   GLUCOSEU 50 (A) 03/10/2019 1054   HGBUR SMALL (A) 03/10/2019 1054   BILIRUBINUR NEGATIVE 03/10/2019 1054   [place] 03/10/2019 1054   PROTEINUR >=300 (A) 03/10/2019 1054   NITRITE NEGATIVE 03/10/2019 1054   LEUKOCYTESUR LARGE (A) 03/10/2019 1054   Sepsis Labs: _1 (procalcitonin:4,lacticidven:4) ) Recent Results (from the past 240 hour(s))  Respiratory Panel by RT PCR (Flu A&B, Covid) - Nasopharyngeal Swab     Status: None   Collection Time: 10/11/19  1:23 AM   Specimen: Nasopharyngeal Swab  Result Value Ref Range Status   SARS Coronavirus 2 by RT PCR NEGATIVE NEGATIVE Final    Comment: (NOTE) SARS-CoV-2 target nucleic acids are NOT DETECTED. The SARS-CoV-2 RNA is generally detectable  in upper respiratoy specimens during the acute phase of infection. The lowest concentration of SARS-CoV-2 viral copies this assay can detect is 131 copies/mL. A negative result does not preclude SARS-Cov-2  infection and should not be used as the sole basis for treatment or other patient management decisions. A negative result may occur with  improper specimen collection/handling, submission of specimen other than nasopharyngeal swab, presence of viral mutation(s) within the areas targeted by this assay, and inadequate number of viral copies (<131 copies/mL). A negative result must be combined with clinical observations, patient history, and epidemiological information. The expected result is Negative. Fact Sheet for Patients:  PinkCheek.be Fact Sheet for Healthcare Providers:  GravelBags.it This test is not yet ap proved or cleared by the [place] FDA and  has been authorized for detection and/or diagnosis of SARS-CoV-2 by FDA under an Emergency Use Authorization (EUA). This EUA will remain  in effect (meaning this test can be used) for the duration of the COVID-19 declaration under Section 564(b)(1) of the Act, 21 U.S.C. section 360bbb-3(b)(1), unless the authorization is terminated or revoked sooner.    Influenza A by PCR NEGATIVE NEGATIVE Final   Influenza B by PCR NEGATIVE NEGATIVE Final    Comment: (NOTE) The Xpert Xpress SARS-CoV-2/FLU/RSV assay is intended as an aid in  the diagnosis of influenza from Nasopharyngeal swab specimens and  should not be used as a sole basis for treatment. Nasal washings and  aspirates are unacceptable for Xpert Xpress SARS-CoV-2/FLU/RSV  testing. Fact Sheet for Patients: PinkCheek.be Fact Sheet for Healthcare Providers: GravelBags.it This test is not yet approved or cleared by the [place] FDA and  has been authorized for  detection and/or diagnosis of SARS-CoV-2 by  FDA under an Emergency Use Authorization (EUA). This EUA will remain  in effect (meaning this test can be used) for the duration of the  Covid-19 declaration under Section 564(b)(1) of the Act, 21  U.S.C. section 360bbb-3(b)(1), unless the authorization is  terminated or revoked. Performed at [facility], [place] [address], [place], [place] [address]      Radiological Exams on Admission: DG Chest Port 1 View  Result Date: 10/11/2019 CLINICAL DATA:  Shortness of breath, chest pain EXAM: PORTABLE CHEST 1 VIEW COMPARISON:  04/13/2019 FINDINGS: Small left pleural effusion. Cardiomegaly. Left base atelectasis or infiltrate. Right lung clear. No acute bony abnormality. IMPRESSION: Left base atelectasis or infiltrate with small left pleural effusion. Cardiomegaly. Electronically Signed   By: Rolm Baptise M.D.   On: 10/11/2019 01:08    EKG: Independently reviewed.  Normal sinus rhythm.  Assessment/Plan Principal Problem:   CAP (community acquired pneumonia) Active Problems:   Anemia of chronic disease   ESRD on dialysis ([place])   Diabetes mellitus with peripheral vascular disease (HCC)   Calciphylaxis    1. Community-acquired pneumonia for which patient is on ceftriaxone and Zithromax.  Follow cultures.  Check urine for Legionella strep antigen if patient can make urine.  Covid test was negative. 2. Chills and rigors likely from pneumonia.  Patient also had his dialysis catheter removed yesterday.  Follow cultures. 3. Left upper quadrant abdominal pain after fall 2 days ago.  For which I have ordered CT abdomen pelvis. 4. ESRD on hemodialysis on Monday Wednesday and Friday consult nephrology. 5. Diabetes mellitus type 2 on diet presently blood sugar is around 60 will check a stat CBG. 6. Anemia of chronic disease follow CBC. 7. Chronic hypotension on midodrine. 8. Calciphylaxis on oxycodone and tramadol.  Patient's home medications has to  be reconciled and ordered accordingly.   DVT prophylaxis: For now I am keeping patient on SCD and avoiding anticoagulation we will get a CT abdomen  pelvis. Code Status: Full code. Family Communication: Discussed with patient. Disposition Plan: Home. Consults called: None. Admission status: Observation.   Rise Patience MD [facility] Pager [phone].  If 7PM-7AM, please contact night-coverage [email or web address] Password [facility]  10/11/2019, 6:46 AM

## 2019-10-11 NOTE — Progress Notes (Signed)
49 year old male with ESRD on MWF HD in [place] with DM, PVD, calciphylaxis, admitted with PNA as well as recent fall at home.  Patient neg for COVID.  Labs per usual for dialysis patient.  Hgb 11.1 consistent with outpatient hgb.  Currently admitted to OBS status.  Dialysis orders written for today. Continue Na thiosulfate.  Full consult if changed to inpatient status.  Amalia Hailey, PA-C [facility]

## 2019-10-11 NOTE — ED Provider Notes (Signed)
[name] Provider Note   CSN: [id number] Arrival date & time: 10/10/19  1810     History Chief Complaint  Patient presents with  . Cough/Chills    Missed Hemodialysis    Michael Riggs is a 49 y.o. male.  The history is provided by the patient.  Cough Severity:  Moderate Onset quality:  Gradual Duration:  1 week Timing:  Intermittent Progression:  Worsening Chronicity:  New Relieved by:  Nothing Worsened by:  Nothing Associated symptoms: chills, diaphoresis, myalgias, shortness of breath and sore throat   Associated symptoms: no chest pain and no fever   Patient with history of ESRD on dialysis, diabetes, hypertension presents with cough.  Reports for the past week he has had increasing cough and shortness of breath.  He reports waking up diaphoretic.  No chest pain.  No vomiting or diarrhea.  He was informed by his nephrologist to be evaluated.  He went to urgent care and reportedly tested negative for Covid.      Past Medical History:  Diagnosis Date  . Anemia   . Anemia of chronic disease   . Anxiety   . ARF (acute renal failure) ([place]) 01/30/2019  . Calciphylaxis   . Controlled type 2 diabetes mellitus with hyperglycemia, without long-term current use of insulin ([place])   . Debility 02/09/2019  . Depression   . Diabetes mellitus type 2 in obese ([place]) 07/04/2013  . Diabetes mellitus with peripheral vascular disease ([facility])    type 2 no meds, lost 100 lbs  . Dyslipidemia   . Dyspnea    inhaler  . End stage renal disease ([place])   . ESRD (end stage renal disease) on dialysis ([place]) 01/2019   MWFS  . ESRD on dialysis ([place])   . Essential hypertension   . Fluid overload 02/04/2019  . GERD (gastroesophageal reflux disease)   . Goals of care, counseling/discussion   . Habitual alcohol use 07/04/2013  . Headache   . Hyperkalemia 01/30/2019  . Hypertension   . Hypoglycemia 01/30/2019  . Hypokalemia 01/30/2019  . Labile blood  pressure   . Leukocytosis   . Lower GI bleed 05/14/2019  . Macrocytic anemia 01/30/2019  . Obesity, Class II, BMI 35-39.9, with comorbidity 07/04/2013  . Palliative care encounter   . PDR (proliferative diabetic retinopathy) ([place]) 12/14/2013  . Physical deconditioning   . Pressure injury of skin 04/27/2019  . Pruritus   . Scrotal edema   . Scrotal pain   . Sleep apnea    uses CPAP  . Sleep disturbance   . Slow transit constipation   . Status post below-knee amputation of left lower extremity ([place])   . Stroke [facility])    mini -shown on CT scan  . Syphilis 01/2019  . Urinary retention   . Venous hypertension 09/22/2013  . Wound infection 03/28/2019    Patient Active Problem List   Diagnosis Date Noted  . Lower GI bleed 05/14/2019  . Pressure injury of skin 04/27/2019  . Palliative care encounter   . Goals of care, counseling/discussion   . Calciphylaxis   . Wound infection 03/28/2019  . Hypertension   . Dyslipidemia   . Diabetes mellitus with peripheral vascular disease ([place])   . Depression   . Physical deconditioning   . Pruritus   . Scrotal pain   . Labile blood pressure   . Scrotal edema   . Status post below-knee amputation of left lower extremity ([place])   . Slow  transit constipation   . Leukocytosis   . Sleep disturbance   . Essential hypertension   . Anemia of chronic disease   . Controlled type 2 diabetes mellitus with hyperglycemia, without long-term current use of insulin ([place])   . ESRD on dialysis ([place])   . Urinary retention   . Debility 02/09/2019  . Fluid overload 02/04/2019  . Hyperkalemia 01/30/2019  . ARF (acute renal failure) ([place]) 01/30/2019  . Hypokalemia 01/30/2019  . Hypoglycemia 01/30/2019  . Syphilis 01/30/2019  . Macrocytic anemia 01/30/2019  . End stage renal disease ([place])   . ESRD (end stage renal disease) on dialysis ([place]) 01/2019  . PDR (proliferative diabetic retinopathy) ([place]) 12/14/2013  . Venous hypertension 09/22/2013  . Diabetes  mellitus type 2 in obese ([place]) 07/04/2013  . Habitual alcohol use 07/04/2013  . Obesity, Class II, BMI 35-39.9, with comorbidity 07/04/2013    Past Surgical History:  Procedure Laterality Date  . AV FISTULA PLACEMENT Left 01/09/2019   Procedure: ARTERIOVENOUS (AV) FISTULA CREATION LEFT UPPER ARM;  Surgeon: [name], MD;  Location: [facility];  Service: Vascular;  Laterality: Left;  . BASCILIC VEIN TRANSPOSITION Left 07/06/2019   Procedure: BASILIC VEIN TRANSPOSITION SECOND STAGE LEFT ARM;  Surgeon: [name], MD;  Location[facility];  Service: Vascular;  Laterality: Left;  . below the knee amputation Left   . EYE SURGERY Bilateral   . FLEXIBLE SIGMOIDOSCOPY N/A 05/15/2019   Procedure: FLEXIBLE SIGMOIDOSCOPY;  Surgeon: Rush Landmark Telford Nab., MD;  Location: [place];  Service: Gastroenterology;  Laterality: N/A;  . HEMOSTASIS CLIP PLACEMENT  05/15/2019   Procedure: HEMOSTASIS CLIP PLACEMENT;  Surgeon: Irving Copas., MD;  Location: [facility];  Service: Gastroenterology;;  . HOT HEMOSTASIS N/A 05/15/2019   Procedure: HOT HEMOSTASIS (ARGON PLASMA COAGULATION/BICAP);  Surgeon: Irving Copas., MD;  Location: [facility];  Service: Gastroenterology;  Laterality: N/A;  . IR FLUORO GUIDE CV LINE RIGHT  01/31/2019  . IR FLUORO GUIDE CV LINE RIGHT  02/03/2019  . IR US GUIDE VASC ACCESS RIGHT  01/31/2019  . IR US GUIDE VASC ACCESS RIGHT  02/03/2019  . SCLEROTHERAPY  05/15/2019   Procedure: Clide Deutscher;  Surgeon: Mansouraty, Telford Nab., MD;  Location: [facility] ENDOSCOPY;  Service: Gastroenterology;;       Family History  Problem Relation Age of Onset  . Diabetes Mother   . Multiple myeloma Mother     Social History   Tobacco Use  . Smoking status: Former Smoker    Types: Cigarettes    Start date: 11/18/2018    Quit date: 01/18/2019    Years since quitting: 0.7  . Smokeless tobacco: Never Used  . Tobacco comment: pt stated got bored with it  Substance Use Topics  .  Alcohol use: Not Currently    Comment: heavy drinker in the past, none since 07/19/18  . Drug use: Not Currently    Home Medications Prior to Admission medications   Medication Sig Start Date End Date Taking? Authorizing Provider  acetaminophen (TYLENOL) 325 MG tablet Take 2 tablets (650 mg total) by mouth every 6 (six) hours as needed for mild pain (or Fever >/= 101). 03/13/19   Angiulli, Lavon Paganini, PA-C  albuterol (VENTOLIN HFA) 108 (90 Base) MCG/ACT inhaler Inhale 2 puffs into the lungs every 6 (six) hours as needed for wheezing or shortness of breath.    [provider]  amLODipine (NORVASC) 5 MG tablet Take 5 mg by mouth daily.    [provider]  atorvastatin (LIPITOR) 40 MG tablet Take 1 tablet (40 mg total) by mouth daily. 03/13/19   Angiulli, Lavon Paganini, PA-C  collagenase (SANTYL) ointment Apply 1 application topically daily. Apply to the affected area daily plus dry dressing 07/18/19   Suzan Slick, NP  Hydrocortisone (GERHARDT'S BUTT CREAM) CREA Apply 1 application topically 3 (three) times daily as needed for irritation. 04/27/19   Dessa Phi, DO  ipratropium-albuterol (DUONEB) 0.5-2.5 (3) MG/3ML SOLN Take 3 mLs by nebulization every 6 (six) hours as needed (For shortness of breath).    [provider]  Melatonin 5 MG TABS Take 5 mg by mouth at bedtime as needed (sleep).    [provider]  midodrine (PROAMATINE) 10 MG tablet Take 10 mg by mouth daily.    [provider]  oxycodone (OXY-IR) 5 MG capsule Take 1 capsule (5 mg total) by mouth every 6 (six) hours as needed. 07/06/19   Dagoberto Ligas, PA-C  pantoprazole (PROTONIX) 40 MG tablet Take 1 tablet (40 mg total) by mouth daily. 03/13/19   Angiulli, Lavon Paganini, PA-C  tamsulosin (FLOMAX) 0.4 MG CAPS capsule Take 1 capsule (0.4 mg total) by mouth daily after breakfast. 03/13/19   Angiulli, Lavon Paganini, PA-C  traMADol (ULTRAM) 50 MG tablet Take 1 tablet (50 mg total) by mouth every 8 (eight)  hours as needed for moderate pain. 05/18/19   Mercy Riding, MD    Allergies    Patient has no known allergies.  Review of Systems   Review of Systems  Constitutional: Positive for chills and diaphoresis. Negative for fever.  HENT: Positive for sore throat.   Respiratory: Positive for cough and shortness of breath.   Cardiovascular: Negative for chest pain.  Gastrointestinal: Negative for diarrhea and vomiting.  Musculoskeletal: Positive for myalgias.  All other systems reviewed and are negative.   Physical Exam Updated Vital Signs BP 137/87 (BP Location: Right Arm)   Pulse 80   Temp 97.8 F (36.6 C) (Oral)   Resp 20   Ht 1.778 m ('5\' 10"' )   Wt 100 kg   SpO2 93%   BMI 31.63 kg/m   Physical Exam CONSTITUTIONAL: Chronically ill-appearing HEAD: Normocephalic/atraumatic EYES: EOMI ENMT: Mucous membranes moist NECK: supple no meningeal signs SPINE/BACK:entire spine nontender CV: S1/S2 noted, no murmurs/rubs/gallops noted LUNGS: Coarse breath sounds bilaterally, no acute distress ABDOMEN: soft, nontender, no rebound or guarding, bowel sounds noted throughout abdomen GU:no cva tenderness NEURO: Pt is awake/alert/appropriate, moves all extremities  No facial droop.   EXTREMITIES: pulses normal/equal, dialysis access to left arm with thrill noted SKIN: warm, multiple healing lesions to the legs.  Diaphoretic PSYCH: no abnormalities of mood noted, alert and oriented to situation  ED Results / Procedures / Treatments   Labs (all labs ordered are listed, but only abnormal results are displayed) Labs Reviewed  CBC WITH DIFFERENTIAL/PLATELET - Abnormal; Notable for the following components:      Result Value   RBC 3.88 (*)    Hemoglobin 11.1 (*)    HCT 35.3 (*)    RDW 17.0 (*)    All other components within normal limits  COMPREHENSIVE METABOLIC PANEL - Abnormal; Notable for the following components:   Chloride 93 (*)    Glucose, Bld 68 (*)    BUN 78 (*)    Creatinine,  Ser 10.76 (*)    Calcium 8.4 (*)    Alkaline Phosphatase 170 (*)    GFR calc non Af Amer 5 (*)    GFR calc  Af Amer 6 (*)    Anion gap 18 (*)    All other components within normal limits  RESPIRATORY PANEL BY RT PCR (FLU A&B, COVID)  CULTURE, BLOOD (ROUTINE X 2)  CULTURE, BLOOD (ROUTINE X 2)    EKG EKG Interpretation  Date/Time:  Wednesday October 11 2019 01:12:49 EDT Ventricular Rate:  79 PR Interval:    QRS Duration: 104 QT Interval:  430 QTC Calculation: 493 R Axis:   94 Text Interpretation: Sinus rhythm Borderline right axis deviation Borderline prolonged QT interval Confirmed by Ripley Fraise ([id number]) on 10/11/2019 1:17:58 AM   Radiology DG Chest Port 1 View  Result Date: 10/11/2019 CLINICAL DATA:  Shortness of breath, chest pain EXAM: PORTABLE CHEST 1 VIEW COMPARISON:  04/13/2019 FINDINGS: Small left pleural effusion. Cardiomegaly. Left base atelectasis or infiltrate. Right lung clear. No acute bony abnormality. IMPRESSION: Left base atelectasis or infiltrate with small left pleural effusion. Cardiomegaly. Electronically Signed   By: Rolm Baptise M.D.   On: 10/11/2019 01:08    Procedures Procedures (including critical care time)  Medications Ordered in ED Medications  cefTRIAXone (ROCEPHIN) 1 g in sodium chloride 0.9 % 100 mL IVPB (1 g Intravenous New Bag/Given 10/11/19 0346)  azithromycin (ZITHROMAX) tablet 500 mg (500 mg Oral Given 10/11/19 0344)    ED Course  I have reviewed the triage vital signs and the nursing notes.  Pertinent labs & imaging results that were available during my care of the patient were reviewed by me and considered in my medical decision making (see chart for details).    MDM Rules/Calculators/A&P                      3:50 AM Patient presents for cough, chills, diaphoresis for the past week. Initial presentation he appeared ill as he was diaphoretic coughing.  His pulse ox did drop to the low 90s but is now improving.  COVID-19 test is  negative. However he does evidence of infiltrate as well as effusion in the lungs. Due to his comorbidities, he will be admitted for IV antibiotics.  He has no emergent indications for dialysis this time.  Discussed with Dr. Hal Hope for admission  Calvyn Kurtzman was evaluated in Emergency Department on 10/11/2019 for the symptoms described in the history of present illness. He was evaluated in the context of the global COVID-19 pandemic, which necessitated consideration that the patient might be at risk for infection with the SARS-CoV-2 virus that causes COVID-19. Institutional protocols and algorithms that pertain to the evaluation of patients at risk for COVID-19 are in a state of rapid change based on information released by regulatory bodies including the CDC and federal and state organizations. These policies and algorithms were followed during the patient's care in the ED.  Final Clinical Impression(s) / ED Diagnoses Final diagnoses:  Community acquired pneumonia of left lower lobe of lung    Rx / DC Orders ED Discharge Orders    None       Ripley Fraise, MD 10/11/19 [phone]

## 2019-10-12 ENCOUNTER — Observation Stay (HOSPITAL_COMMUNITY): Payer: Medicare Other

## 2019-10-12 DIAGNOSIS — J189 Pneumonia, unspecified organism: Secondary | ICD-10-CM | POA: Diagnosis not present

## 2019-10-12 DIAGNOSIS — S3991XA Unspecified injury of abdomen, initial encounter: Secondary | ICD-10-CM | POA: Diagnosis not present

## 2019-10-12 LAB — BASIC METABOLIC PANEL
Anion gap: 19 — ABNORMAL HIGH (ref 5–15)
BUN: 25 mg/dL — ABNORMAL HIGH (ref 6–20)
CO2: 24 mmol/L (ref 22–32)
Calcium: 8.2 mg/dL — ABNORMAL LOW (ref 8.9–10.3)
Chloride: 95 mmol/L — ABNORMAL LOW (ref 98–111)
Creatinine, Ser: 6.14 mg/dL — ABNORMAL HIGH (ref 0.61–1.24)
GFR calc Af Amer: 12 mL/min — ABNORMAL LOW (ref >60)
GFR calc non Af Amer: 10 mL/min — ABNORMAL LOW (ref >60)
Glucose, Bld: 112 mg/dL — ABNORMAL HIGH (ref 70–99)
Potassium: 3.9 mmol/L (ref 3.5–5.1)
Sodium: 138 mmol/L (ref 135–145)

## 2019-10-12 LAB — CBC
HCT: 32 % — ABNORMAL LOW (ref 39.0–52.0)
Hemoglobin: 10.4 g/dL — ABNORMAL LOW (ref 13.0–17.0)
MCH: 28.5 pg (ref 26.0–34.0)
MCHC: 32.5 g/dL (ref 30.0–36.0)
MCV: 87.7 fL (ref 80.0–100.0)
Platelets: 202 10*3/uL (ref 150–400)
RBC: 3.65 MIL/uL — ABNORMAL LOW (ref 4.22–5.81)
RDW: 16.9 % — ABNORMAL HIGH (ref 11.5–15.5)
WBC: 6.3 10*3/uL (ref 4.0–10.5)
nRBC: 0 % (ref 0.0–0.2)

## 2019-10-12 LAB — GLUCOSE, CAPILLARY
Glucose-Capillary: 69 mg/dL — ABNORMAL LOW (ref 70–99)
Glucose-Capillary: 109 mg/dL — ABNORMAL HIGH (ref 70–99)
Glucose-Capillary: 90 mg/dL (ref 70–99)

## 2019-10-12 IMAGING — CT CT ABD-PELV W/O CM
2 of 4 series · 17 of 46 positions shown, 19 images · non-contrast
Comparison: [DATE]

CLINICAL DATA: Status post fall with abdominal trauma.

EXAM:
CT ABDOMEN AND PELVIS WITHOUT CONTRAST
TECHNIQUE: Multidetector CT imaging of the abdomen and pelvis was performed
following the standard protocol without IV contrast.

[Series 3: abd/ pelvis 5.0 i30f 2 · axial · 0.88mm/px · z∈[+961,+1406]mm · 14 of 97 slices shown, 16 images]
[im 4/97  soft-tissue]
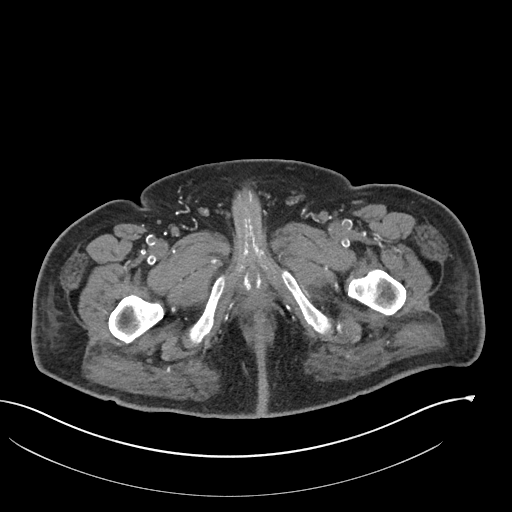
[im 4/97  bone]
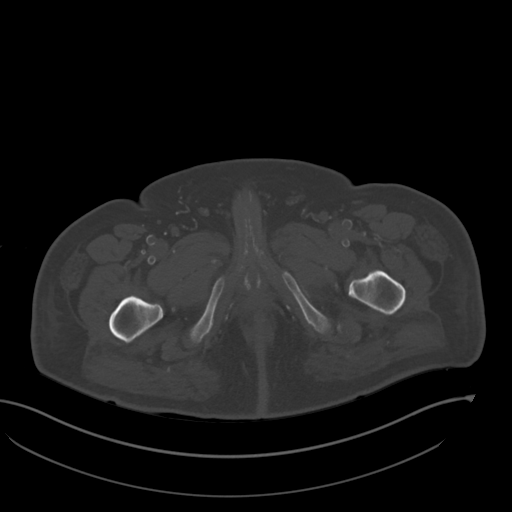
[im 12/97  soft-tissue]
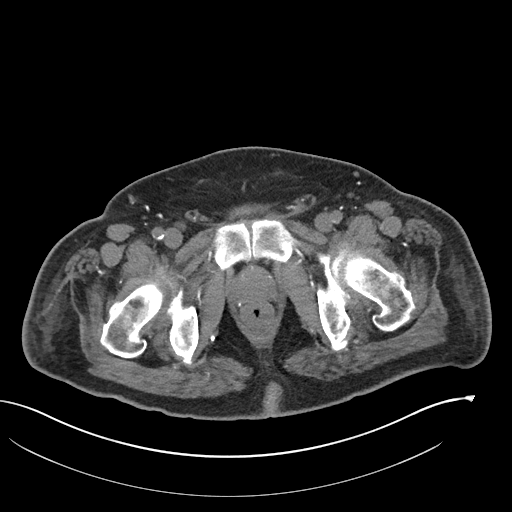
[im 20/97  soft-tissue]
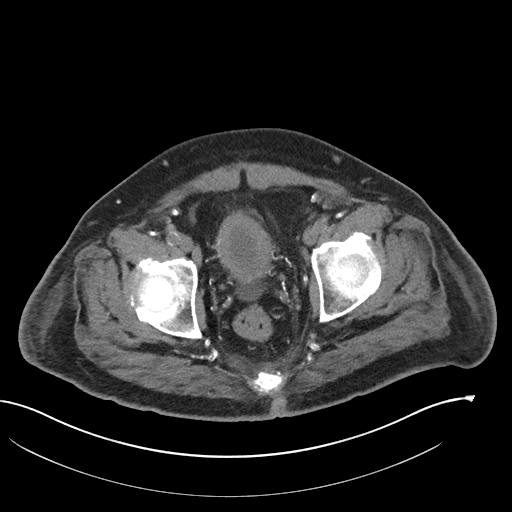
[im 27/97  soft-tissue]
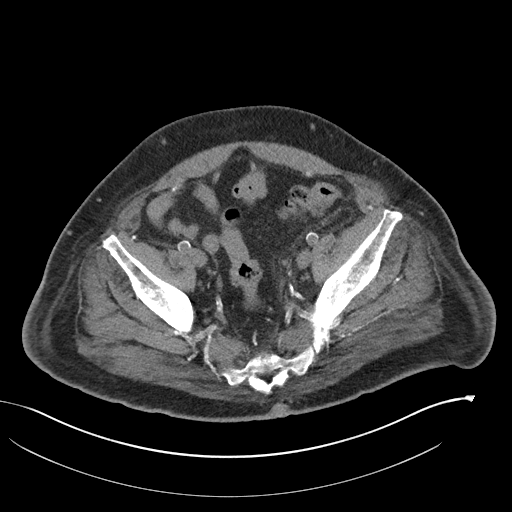
[im 31/97  soft-tissue]
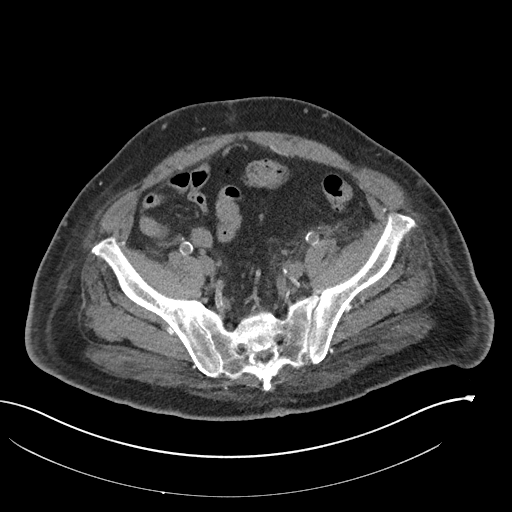
[im 39/97  soft-tissue]
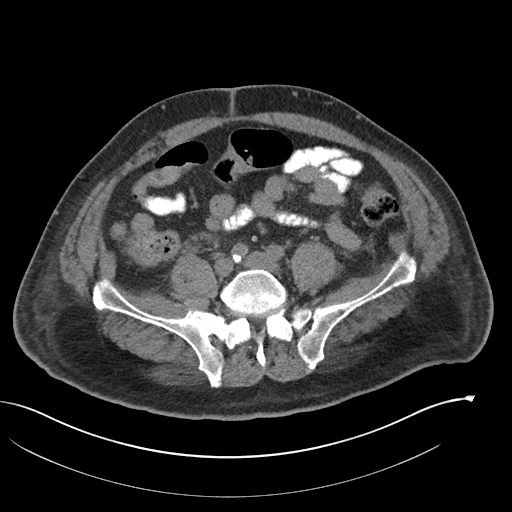
[im 47/97  soft-tissue]
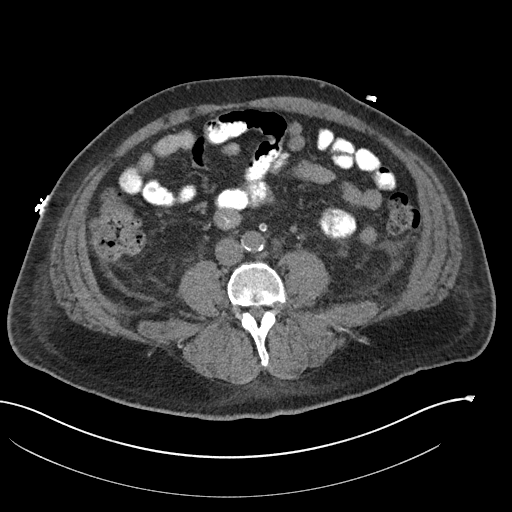
[im 50/97  soft-tissue]
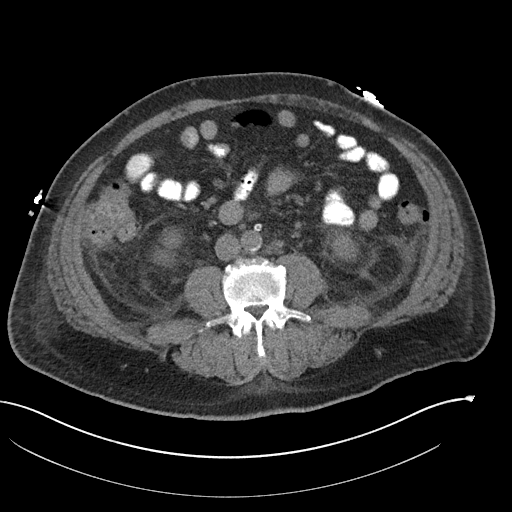
[im 58/97  soft-tissue]
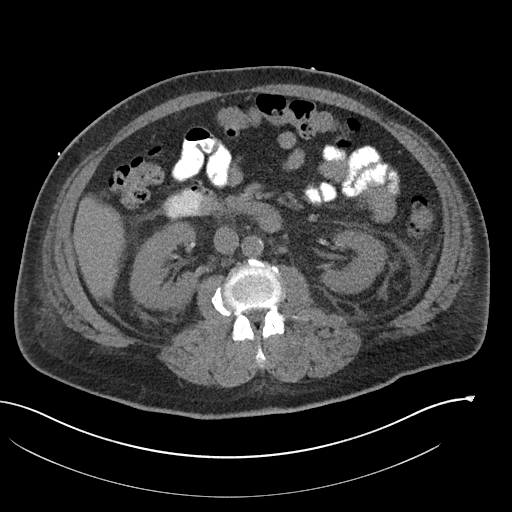
[im 58/97  bone]
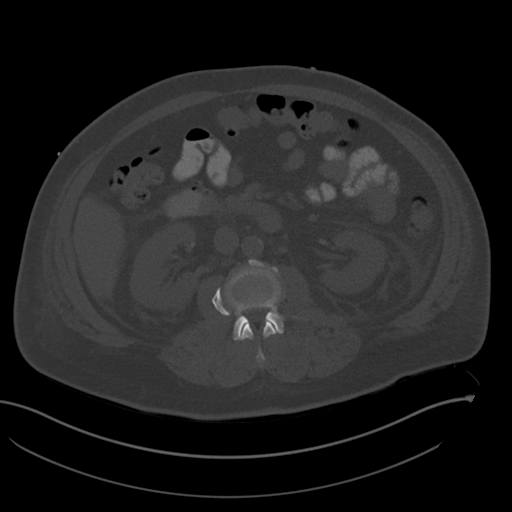
[im 66/97  soft-tissue]
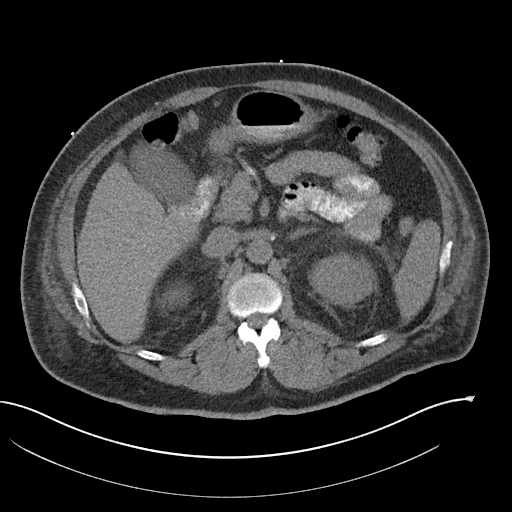
[im 73/97  soft-tissue]
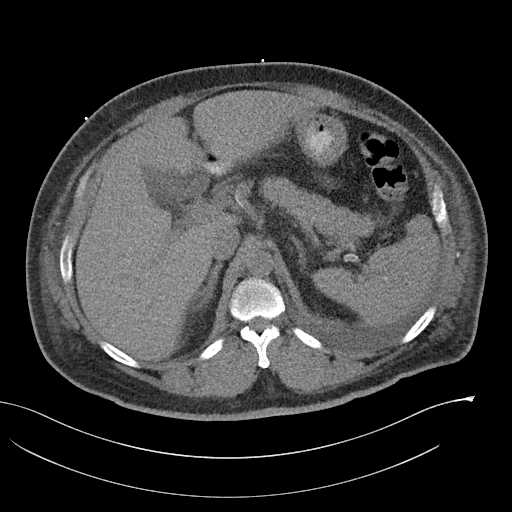
[im 77/97  soft-tissue]
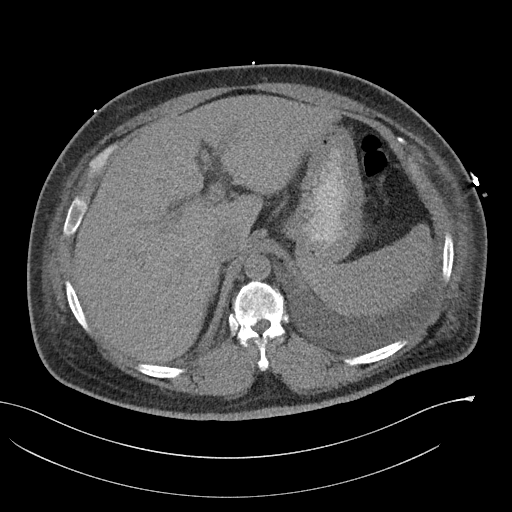
[im 85/97  soft-tissue]
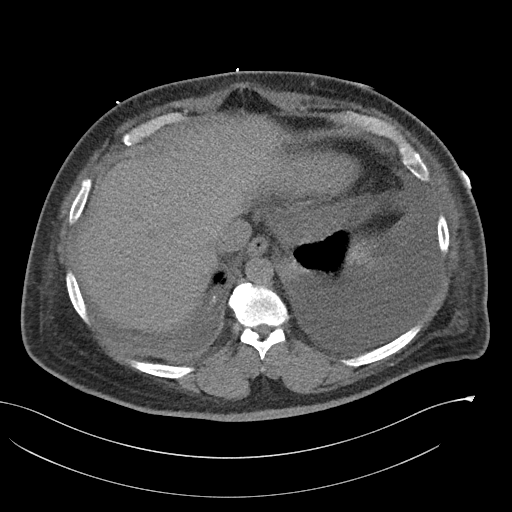
[im 93/97  soft-tissue]
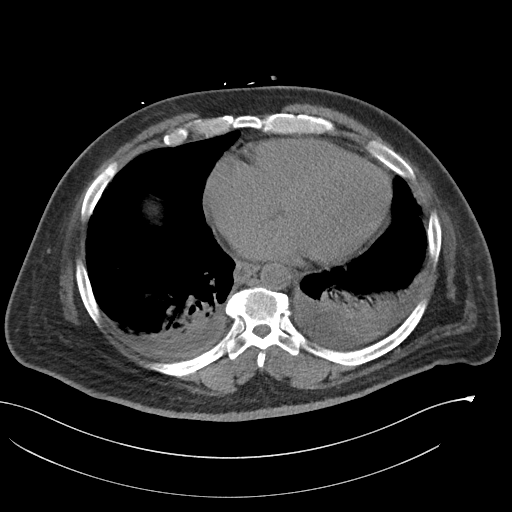

[Series 6: cor st · coronal · 0.85mm/px · 3 of 116 slices shown]
[im 39/116  soft-tissue]
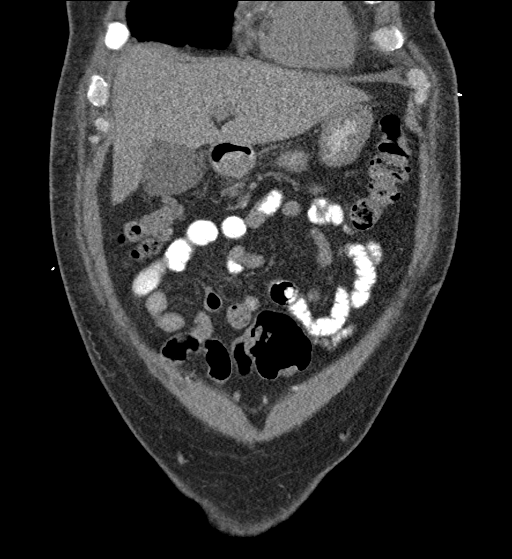
[im 52/116  soft-tissue]
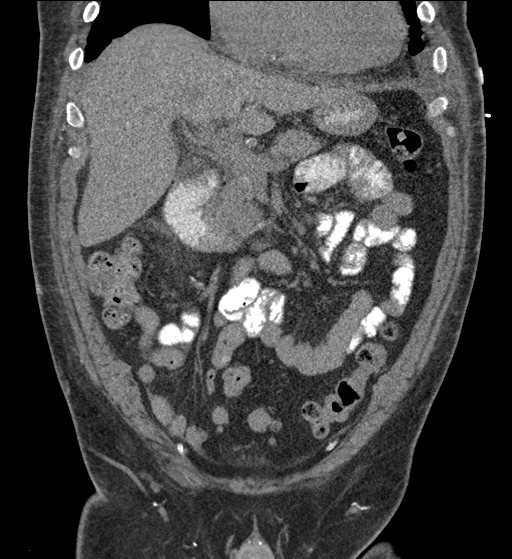
[im 64/116  soft-tissue]
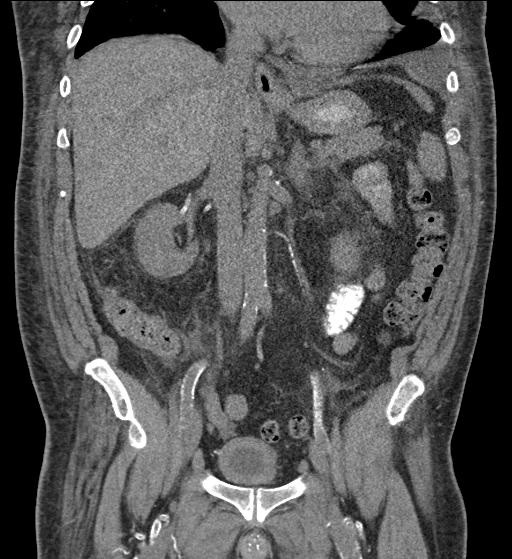

[17 of 46 positions shown; findings below may reference images not displayed]

FINDINGS: Lower chest: Bilateral pleural effusions with atelectasis of
bilateral lung bases are identified. The heart size is normal.

Hepatobiliary: The liver is normal on this noncontrast exam. No
focal liver lesion is identified. Gallstones are noted in the
gallbladder. There is question mild gallbladder wall thickening. The
biliary tree is normal.

Pancreas: Unremarkable. No pancreatic ductal dilatation or
surrounding inflammatory changes.

Spleen: Normal in size without focal abnormality.

Adrenals/Urinary Tract: Adrenal glands are unremarkable. Kidneys are
normal, without renal calculi, focal lesion, or hydronephrosis. The
bladder is decompressed. There is mild diffuse thick walled bladder
which may be due to decompression.

Stomach/Bowel: Stomach is within normal limits. Appendix appears
normal. No evidence of bowel wall thickening, distention, or
inflammatory changes.

Vascular/Lymphatic: Aortic atherosclerosis. No enlarged abdominal or
pelvic lymph nodes.

Reproductive: Prostate is unremarkable.

Other: Mild ascites is noted in the abdomen and pelvis.

Musculoskeletal: Degenerative joint changes of the spine are noted.
IMPRESSION: 1. No acute posttraumatic change identified in the abdomen and
pelvis.
2. Bilateral pleural effusions with atelectasis of bilateral lung
bases.
3. Mild ascites in the abdomen and pelvis.

Aortic Atherosclerosis ([7G]-[7G]).

## 2019-10-12 MED ORDER — DOXYCYCLINE HYCLATE 100 MG PO TABS: 100.0000 mg | ORAL_TABLET | Freq: Two times a day (BID) | ORAL | 0 refills | Status: DC

## 2019-10-12 MED ORDER — DICLOFENAC SODIUM 1 % EX GEL: 2.0000 g | Freq: Four times a day (QID) | CUTANEOUS | 0 refills | Status: DC

## 2019-10-12 MED ORDER — PANTOPRAZOLE SODIUM 40 MG PO TBEC
40.0000 mg | DELAYED_RELEASE_TABLET | Freq: Every day | ORAL | Status: DC
  Administered 2019-10-12: 40 mg via ORAL
  Filled 2019-10-12: qty 1

## 2019-10-12 MED ORDER — DOXYCYCLINE HYCLATE 100 MG PO TABS
100.0000 mg | ORAL_TABLET | Freq: Two times a day (BID) | ORAL | Status: DC
  Administered 2019-10-12: 100 mg via ORAL
  Filled 2019-10-12: qty 1

## 2019-10-12 MED ORDER — DICLOFENAC SODIUM 1 % EX GEL
2.0000 g | Freq: Four times a day (QID) | CUTANEOUS | Status: DC
  Filled 2019-10-12: qty 100

## 2019-10-12 NOTE — TOC Transition Note (Signed)
Transition of Care [facility]) - CM/SW Discharge Note   Patient Details  Name: Michael Riggs MRN: [id number] Date of Birth: 11-Dec-1970  [facility] CM/SW Contact:  Atilano Median, LCSW Phone Number: 10/12/2019, 3:39 PM   Clinical Narrative:     Referral made for home health services. CSW waiting to see if they are able to accept patient.    Discharged home with home health services. Patient aware and agreeable to this plan. Family will provide transport. No other needs at this time. Case closed to this CSW.   Final next level of care: Home w Home Health Services Barriers to Discharge: Barriers Resolved   Patient Goals and CMS Choice   CMS Medicare.gov Compare Post Acute Care list provided to:: Patient Choice offered to / list presented to : Patient  Discharge Placement                       Discharge Plan and Services                          HH Arranged: PT, OT [facility] Agency: [facility] Date [place]: 10/12/19 Time [place]: [place] Representative spoke with at [place]: Cassie  Social Determinants of Health ([place]) Interventions     Readmission Risk Interventions No flowsheet data found.

## 2019-10-12 NOTE — Discharge Summary (Signed)
Physician Discharge Summary  Michael Riggs CHY:[id number] DOB: February 24, 1971 DOA: 10/10/2019  PCP: [name], MD  Admit date: 10/10/2019 Discharge date: 10/12/2019  Admitted From: Home Discharge disposition: Home   Recommendations for Outpatient Follow-Up:   Home health Will need to follow chest x-ray to ensure resolution of suspected pneumonia  Discharge Diagnosis:   Principal Problem:   CAP (community acquired pneumonia) Active Problems:   Anemia of chronic disease   ESRD on dialysis ([place])   Diabetes mellitus with peripheral vascular disease ([place])   Calciphylaxis    Discharge Condition: Improved.  Diet recommendation: Renal   Code status: Full.   History of Present Illness:   Michael Riggs is a 49 y.o. male with history of ESRD on hemodialysis on Monday Wednesday Friday, diabetes mellitus type 2 on chronic hypotension and anemia BPH left BKA presents to the ER complaints of having subjective feeling of chills and rigors over the last 1 week.  Has been having cough at times productive of sputum which is greenish in color.  No recorded fever.  2 days ago patient also had a fall when he was getting out of his bed and fell onto the floor hit his left side of the chest.  Has been having pain in the left upper quadrant.  Has not missed his dialysis.  Patient had his dialysis catheter removed yesterday from his right upper chest.  Left AV fistula does not look erythematous or indurated.    Hospital Course by Problem:   Community-acquired pneumonia  -Improved on IV antibiotics so will transition to p.o. antibiotics.  Chills and rigors likely from pneumonia -Blood cultures no growth to date, please follow to completion -Resolved with antibiotics.  Left upper quadrant abdominal pain  -CT scan does not show any hematomas or fractures  ESRD  -on hemodialysis on Monday Wednesday and Friday   Diabetes mellitus type 2  -Patient does not use any  medications  Anemia of chronic disease -outpatient follow up  Chronic hypotension  -on midodrine.  Calciphylaxis  -on oxycodone and tramadol.    Medical Consultants:   Nephrology   Discharge Exam:   Vitals:   10/11/19 2348 10/12/19 0857  BP: (!) 155/90 (!) 151/88  Pulse: 81 88  Resp:  20  Temp:  99.1 F (37.3 C)  SpO2: 100% 98%   Vitals:   10/11/19 1723 10/11/19 2030 10/11/19 2348 10/12/19 0857  BP: (!) 173/96  (!) 155/90 (!) 151/88  Pulse: 88  81 88  Resp: (!) 21 (!) 23  20  Temp: 98.6 F (37 C)   99.1 F (37.3 C)  TempSrc: Oral     SpO2: 95%  100% 98%  Weight: 98.3 kg     Height:        General exam: Appears calm and comfortable.  The results of significant diagnostics from this hospitalization (including imaging, microbiology, ancillary and laboratory) are listed below for reference.     Procedures and Diagnostic Studies:   DG Chest Port 1 View  Result Date: 10/11/2019 CLINICAL DATA:  Shortness of breath, chest pain EXAM: PORTABLE CHEST 1 VIEW COMPARISON:  04/13/2019 FINDINGS: Small left pleural effusion. Cardiomegaly. Left base atelectasis or infiltrate. Right lung clear. No acute bony abnormality. IMPRESSION: Left base atelectasis or infiltrate with small left pleural effusion. Cardiomegaly. Electronically Signed   By: Rolm Baptise M.D.   On: 10/11/2019 01:08     Labs:   Basic Metabolic Panel: Recent Labs  Lab 10/10/19 2001  10/10/19 2001 10/11/19 1412 10/12/19 0202  NA 135  --  135 138  K 4.9   < > 3.5 3.9  CL 93*  --  94* 95*  CO2 24  --  25 24  GLUCOSE 68*  --  70 112*  BUN 78*  --  39* 25*  CREATININE 10.76*  --  6.63* 6.14*  CALCIUM 8.4*  --  7.9* 8.2*  PHOS  --   --  2.9  --    < > = values in this interval not displayed.   GFR Estimated Creatinine Clearance: 17.5 mL/min (A) (by C-G formula based on SCr of 6.14 mg/dL (H)). Liver Function Tests: Recent Labs  Lab 10/10/19 2001 10/11/19 1412  AST 27  --   ALT 24  --   ALKPHOS  170*  --   BILITOT 1.0  --   PROT 7.5  --   ALBUMIN 3.5 3.2*   No results for input(s): LIPASE, AMYLASE in the last 168 hours. No results for input(s): AMMONIA in the last 168 hours. Coagulation profile No results for input(s): INR, PROTIME in the last 168 hours.  CBC: Recent Labs  Lab 10/10/19 2001 10/11/19 1412 10/12/19 0202  WBC 8.3 6.8 6.3  NEUTROABS 5.7  --   --   HGB 11.1* 10.2* 10.4*  HCT 35.3* 32.4* 32.0*  MCV 91.0 89.5 87.7  PLT 220 201 202   Cardiac Enzymes: No results for input(s): CKTOTAL, CKMB, CKMBINDEX, TROPONINI in the last 168 hours. BNP: Invalid input(s): POCBNP CBG: Recent Labs  Lab 10/11/19 1805 10/11/19 2344 10/12/19 0854 10/12/19 1053 10/12/19 1208  GLUCAP 65* 118* 69* 109* 90   D-Dimer No results for input(s): DDIMER in the last 72 hours. Hgb A1c No results for input(s): HGBA1C in the last 72 hours. Lipid Profile No results for input(s): CHOL, HDL, LDLCALC, TRIG, CHOLHDL, LDLDIRECT in the last 72 hours. Thyroid function studies No results for input(s): TSH, T4TOTAL, T3FREE, THYROIDAB in the last 72 hours.  Invalid input(s): FREET3 Anemia work up No results for input(s): VITAMINB12, FOLATE, FERRITIN, TIBC, IRON, RETICCTPCT in the last 72 hours. Microbiology Recent Results (from the past 240 hour(s))  Respiratory Panel by RT PCR (Flu A&B, Covid) - Nasopharyngeal Swab     Status: None   Collection Time: 10/11/19  1:23 AM   Specimen: Nasopharyngeal Swab  Result Value Ref Range Status   SARS Coronavirus 2 by RT PCR NEGATIVE NEGATIVE Final    Comment: (NOTE) SARS-CoV-2 target nucleic acids are NOT DETECTED. The SARS-CoV-2 RNA is generally detectable in upper respiratoy specimens during the acute phase of infection. The lowest concentration of SARS-CoV-2 viral copies this assay can detect is 131 copies/mL. A negative result does not preclude SARS-Cov-2 infection and should not be used as the sole basis for treatment or other patient  management decisions. A negative result may occur with  improper specimen collection/handling, submission of specimen other than nasopharyngeal swab, presence of viral mutation(s) within the areas targeted by this assay, and inadequate number of viral copies (<131 copies/mL). A negative result must be combined with clinical observations, patient history, and epidemiological information. The expected result is Negative. Fact Sheet for Patients:  PinkCheek.be Fact Sheet for Healthcare Providers:  GravelBags.it This test is not yet ap proved or cleared by the [place] FDA and  has been authorized for detection and/or diagnosis of SARS-CoV-2 by FDA under an Emergency Use Authorization (EUA). This EUA will remain  in effect (meaning this test can be  used) for the duration of the COVID-19 declaration under Section 564(b)(1) of the Act, 21 U.S.C. section 360bbb-3(b)(1), unless the authorization is terminated or revoked sooner.    Influenza A by PCR NEGATIVE NEGATIVE Final   Influenza B by PCR NEGATIVE NEGATIVE Final    Comment: (NOTE) The Xpert Xpress SARS-CoV-2/FLU/RSV assay is intended as an aid in  the diagnosis of influenza from Nasopharyngeal swab specimens and  should not be used as a sole basis for treatment. Nasal washings and  aspirates are unacceptable for Xpert Xpress SARS-CoV-2/FLU/RSV  testing. Fact Sheet for Patients: PinkCheek.be Fact Sheet for Healthcare Providers: GravelBags.it This test is not yet approved or cleared by the [place] FDA and  has been authorized for detection and/or diagnosis of SARS-CoV-2 by  FDA under an Emergency Use Authorization (EUA). This EUA will remain  in effect (meaning this test can be used) for the duration of the  Covid-19 declaration under Section 564(b)(1) of the Act, 21  U.S.C. section 360bbb-3(b)(1), unless the  authorization is  terminated or revoked. Performed at [facility], [place] [address] [place], [place] [address]   Blood culture (routine x 2)     Status: None (Preliminary result)   Collection Time: 10/11/19  3:14 AM   Specimen: BLOOD RIGHT FOREARM  Result Value Ref Range Status   Specimen Description BLOOD RIGHT FOREARM  Final   Special Requests   Final    BOTTLES DRAWN AEROBIC AND ANAEROBIC Blood Culture results may not be optimal due to an inadequate volume of blood received in culture bottles   Culture   Final    NO GROWTH 1 DAY Performed at [facility], [place] [address], [place], [place] [address]    Report Status PENDING  Incomplete  Blood culture (routine x 2)     Status: None (Preliminary result)   Collection Time: 10/11/19  3:15 AM   Specimen: BLOOD RIGHT HAND  Result Value Ref Range Status   Specimen Description BLOOD RIGHT HAND  Final   Special Requests   Final    BOTTLES DRAWN AEROBIC AND ANAEROBIC Blood Culture adequate volume   Culture   Final    NO GROWTH 1 DAY Performed at [facility], [place] [address] [place], [place] [address]    Report Status PENDING  Incomplete     Discharge Instructions:   Discharge Instructions    Discharge instructions   Complete by: As directed    If insurance does not cover, can use OTC volteren cream Renal diet Resume HD as scheduled   Increase activity slowly   Complete by: As directed      Allergies as of 10/12/2019   No Known Allergies     Medication List    TAKE these medications   acetaminophen 325 MG tablet Commonly known as: TYLENOL Take 2 tablets (650 mg total) by mouth every 6 (six) hours as needed for mild pain (or Fever >/= 101).   albuterol 108 (90 Base) MCG/ACT inhaler Commonly known as: VENTOLIN HFA Inhale 2 puffs into the lungs every 6 (six) hours as needed for wheezing or shortness of breath.   amLODipine 5 MG tablet Commonly known as: NORVASC Take 5 mg by mouth daily.     atorvastatin 40 MG tablet Commonly known as: LIPITOR Take 1 tablet (40 mg total) by mouth daily.   buPROPion 300 MG 24 hr tablet Commonly known as: WELLBUTRIN XL Take 300 mg by mouth daily.   collagenase ointment Commonly  known as: SANTYL Apply 1 application topically daily. Apply to the affected area daily plus dry dressing   diclofenac Sodium 1 % Gel Commonly known as: VOLTAREN Apply 2 g topically 4 (four) times daily.   doxycycline 100 MG tablet Commonly known as: VIBRA-TABS Take 1 tablet (100 mg total) by mouth every 12 (twelve) hours.   Gerhardt's butt cream Crea Apply 1 application topically 3 (three) times daily as needed for irritation.   ipratropium-albuterol 0.5-2.5 (3) MG/3ML Soln Commonly known as: DUONEB Take 3 mLs by nebulization every 6 (six) hours as needed (For shortness of breath).   melatonin 5 MG Tabs Take 5 mg by mouth at bedtime as needed (sleep).   midodrine 10 MG tablet Commonly known as: PROAMATINE Take 10 mg by mouth daily.   oxycodone 5 MG capsule Commonly known as: OXY-IR Take 1 capsule (5 mg total) by mouth every 6 (six) hours as needed. What changed: reasons to take this   pantoprazole 40 MG tablet Commonly known as: PROTONIX Take 1 tablet (40 mg total) by mouth daily.   tamsulosin 0.4 MG Caps capsule Commonly known as: FLOMAX Take 1 capsule (0.4 mg total) by mouth daily after breakfast.   traMADol 50 MG tablet Commonly known as: ULTRAM Take 1 tablet (50 mg total) by mouth every 8 (eight) hours as needed for moderate pain.   Velphoro 500 MG chewable tablet Generic drug: sucroferric oxyhydroxide Chew 1,000 mg by mouth 3 (three) times daily with meals.      Follow-up Information    [name], MD Follow up in 1 week(s).   Specialty: Internal Medicine Contact information: [place] [address] [phone]            Time coordinating discharge: 25 minutes  Signed:  Geradine Girt  DO  [facility] 10/12/2019, 3:12 PM

## 2019-10-12 NOTE — Progress Notes (Signed)
Occupational Therapy Treatment Patient Details Name: Michael Riggs MRN: [id number] DOB: 07/08/1971 Today's Date: 10/12/2019    History of present illness Pt is a 49 y/o male admitted secondary to fall and CAP. PMH includes ESRD on HD MWF, DM, HTN, L BKA, and CVA.    OT comments  Patient up in chair, completed full body bath with set up, min assist to reach back.  He says he has full bath set up at home and is able to complete independently.  Did LE while seated with leans.  Educated on deep easy breathing as patient L lower ribs are sore from fall.  Educated on energy conservation techniques.  Will continue to follow with OT acutely to address the deficits listed below.    Follow Up Recommendations  Home health OT;Supervision - Intermittent    Equipment Recommendations  None recommended by OT    Recommendations for Other Services      Precautions / Restrictions Precautions Precautions: Fall Precaution Comments: L BKA Required Braces or Orthoses: Other Brace Other Brace: Prosthetic for L BKA Restrictions Weight Bearing Restrictions: No       Mobility Bed Mobility Overal bed mobility: Modified Independent Bed Mobility: Supine to Sit     Supine to sit: Modified independent (Device/Increase time);HOB elevated     General bed mobility comments: Patient up in chair  Transfers                 Balance Overall balance assessment: Needs assistance;History of Falls Sitting-balance support: Feet supported Sitting balance-Leahy Scale: Good Sitting balance - Comments: ABle to lean and maintian balance                                ADL either performed or assessed with clinical judgement   ADL Overall ADL's : Needs assistance/impaired Eating/Feeding: Set up;Sitting   Grooming: Set up;Sitting;Wash/dry hands;Wash/dry face   Upper Body Bathing: Set up;Sitting   Lower Body Bathing: Set up;Sitting/lateral leans                    General ADL Comments: Patient requesting wash up      Vision       Perception     Praxis      Cognition Arousal/Alertness: Awake/alert Behavior During Therapy: WFL for tasks assessed/performed;Impulsive Overall Cognitive Status: History of cognitive impairments - at baseline                                 General Comments: Reports memory deficits since having CVA.         Exercises     Shoulder Instructions       General Comments      Pertinent Vitals/ Pain       Pain Assessment: Faces Faces Pain Scale: Hurts little more Pain Location: L abdomen (rib area) Pain Descriptors / Indicators: Grimacing;Guarding;Sore Pain Intervention(s): Limited activity within patient's tolerance;Monitored during session;Repositioned  Home Living Family/patient expects to be discharged to:: Private residence Living Arrangements: Parent Available Help at Discharge: Family;Available PRN/intermittently Type of Home: Apartment Home Access: Level entry     Home Layout: One level     Bathroom Shower/Tub: Tub/shower unit         Home Equipment: Wheelchair - [employer] - 2 wheels   Additional Comments: Reports mother cannot physically assist. Reports his WC is broken, but is checking  with MD about getting power WC.       Prior Functioning/Environment Level of Independence: Independent with assistive device(s)        Comments: Reports he was using RW to transfer into WC. Has not been able to walk    Frequency  Min 3X/week        Progress Toward Goals  OT Goals(current goals can now be found in the care plan section)  Progress towards OT goals: Progressing toward goals  Acute Rehab OT Goals Patient Stated Goal: To take a shower OT Goal Formulation: With patient Time For Goal Achievement: 10/26/19 Potential to Achieve Goals: Good  Plan Discharge plan remains appropriate    Co-evaluation                 AM-PAC OT "6 Clicks" Daily  Activity     Outcome Measure   Help from another person eating meals?: None Help from another person taking care of personal grooming?: A Little Help from another person toileting, which includes using toliet, bedpan, or urinal?: A Little Help from another person bathing (including washing, rinsing, drying)?: A Little Help from another person to put on and taking off regular upper body clothing?: A Little Help from another person to put on and taking off regular lower body clothing?: A Little 6 Click Score: 19    End of Session Equipment Utilized During Treatment: Rolling walker  OT Visit Diagnosis: Unsteadiness on feet (R26.81);Repeated falls (R29.6);Muscle weakness (generalized) (M62.81);History of falling (Z91.81)   Activity Tolerance Patient tolerated treatment well   Patient Left in chair;with call bell/phone within reach;with chair alarm set   Nurse Communication Mobility status        Time: 1000-1011 OT Time Calculation (min): 11 min  Charges: OT General Charges $OT Visit: 1 Visit OT Evaluation $OT Eval Moderate Complexity: 1 Mod OT Treatments $Self Care/Home Management : 8-22 mins  August Luz, OTR/L    Michael Riggs 10/12/2019, 12:55 PM

## 2019-10-12 NOTE — Progress Notes (Signed)
Patient given Zofran. Patient was feeling nauseated

## 2019-10-12 NOTE — Progress Notes (Signed)
Occupational Therapy Evaluation Patient Details Name: Michael Riggs MRN: [id number] DOB: 01-06-1971 Today's Date: 10/12/2019    History of Present Illness Pt is a 48 y/o male admitted secondary to fall and CAP. PMH includes ESRD on HD MWF, DM, HTN, L BKA, and CVA.    Clinical Impression   Patient talkative and friendly.  He lives at home with mother who requires a lot of assistance.  Recently got aid to help with mom at home, but he is typically independent with devices and drives.  He has had many falls at home, transfers and walks short distance with RW and uses manual w/c for longer distances.  Today patient min assist with stand and walk to bathroom.  He is quite impulsive and moves quickly, requiring reminders to slow down and keep walker on floor.  Patient set up and seated for all ADLs.  Will continue to follow with OT acutely to address the deficits listed below to increase safety and independence.     Follow Up Recommendations  Home health OT;Supervision - Intermittent    Equipment Recommendations  None recommended by OT    Recommendations for Other Services       Precautions / Restrictions Precautions Precautions: Fall Precaution Comments: L BKA Required Braces or Orthoses: Other Brace Other Brace: Prosthetic for L BKA Restrictions Weight Bearing Restrictions: No      Mobility Bed Mobility Overal bed mobility: Modified Independent Bed Mobility: Supine to Sit     Supine to sit: Modified independent (Device/Increase time);HOB elevated     General bed mobility comments: supervision with use of bed rails, increased time and effort  Transfers Overall transfer level: Needs assistance Equipment used: Rolling walker (2 wheeled) Transfers: Sit to/from Stand Sit to Stand: Min assist;+2 safety/equipment         General transfer comment: Patient is impulsive with standing and walking. Cues to slow down and take his time with mobility to increase safety.     Balance Overall balance assessment: Needs assistance;History of Falls Sitting-balance support: Feet supported Sitting balance-Leahy Scale: Good     Standing balance support: Bilateral upper extremity supported;During functional activity Standing balance-Leahy Scale: Poor Standing balance comment: Reliant on BUE support and external assist with ambulation for safety and fall prevention                           ADL either performed or assessed with clinical judgement   ADL Overall ADL's : Needs assistance/impaired Eating/Feeding: Set up;Sitting   Grooming: Set up;Sitting   Upper Body Bathing: Set up;Sitting   Lower Body Bathing: Set up;Sitting/lateral leans   Upper Body Dressing : Set up;Sitting   Lower Body Dressing: Set up;Sitting/lateral leans Lower Body Dressing Details (indicate cue type and reason): Able to don/doff prosthesis independently Toilet Transfer: Minimal assistance;Ambulation;RW   Toileting- Clothing Manipulation and Hygiene: Supervision/safety;Sitting/lateral lean       Functional mobility during ADLs: Minimal assistance;Rolling walker;+2 for safety/equipment General ADL Comments: Patient is impulsive and moves quickly/jerky when walking     Vision         Perception     Praxis      Pertinent Vitals/Pain Pain Assessment: Faces Faces Pain Scale: Hurts little more Pain Location: L abdomen (rib area) Pain Descriptors / Indicators: Grimacing;Guarding;Sore Pain Intervention(s): Limited activity within patient's tolerance;Monitored during session;Repositioned     Hand Dominance Right   Extremity/Trunk Assessment Upper Extremity Assessment Upper Extremity Assessment: Overall WFL for tasks assessed  Communication Communication Communication: No difficulties   Cognition Arousal/Alertness: Awake/alert Behavior During Therapy: WFL for tasks assessed/performed;Impulsive Overall Cognitive Status: History of cognitive  impairments - at baseline                                 General Comments: Reports memory deficits since having CVA.    General Comments       Exercises     Shoulder Instructions      Home Living Family/patient expects to be discharged to:: Private residence Living Arrangements: Parent Available Help at Discharge: Family;Available PRN/intermittently Type of Home: Apartment Home Access: Level entry     Home Layout: One level     Bathroom Shower/Tub: Tub/shower unit         Home Equipment: Wheelchair - [employer] - 2 wheels   Additional Comments: Reports mother cannot physically assist. Reports his WC is broken, but is checking with MD about getting power WC.       Prior Functioning/Environment Level of Independence: Independent with assistive device(s)        Comments: Reports he was using RW to transfer into WC. Has not been able to walk         OT Problem List: Decreased activity tolerance;Impaired balance (sitting and/or standing);Decreased safety awareness;Decreased cognition;Pain      OT Treatment/Interventions: Self-care/ADL training;Therapeutic exercise;Energy conservation;Therapeutic activities;Cognitive remediation/compensation;Patient/family education;Balance training    OT Goals(Current goals can be found in the care plan section) Acute Rehab OT Goals Patient Stated Goal: To take a shower OT Goal Formulation: With patient Time For Goal Achievement: 10/26/19 Potential to Achieve Goals: Good  OT Frequency: Min 3X/week   Barriers to D/C: Decreased caregiver support          Co-evaluation              AM-PAC OT "6 Clicks" Daily Activity     Outcome Measure Help from another person eating meals?: None Help from another person taking care of personal grooming?: A Little Help from another person toileting, which includes using toliet, bedpan, or urinal?: A Little Help from another person bathing (including washing, rinsing,  drying)?: A Little Help from another person to put on and taking off regular upper body clothing?: A Little Help from another person to put on and taking off regular lower body clothing?: A Little 6 Click Score: 19   End of Session Equipment Utilized During Treatment: Rolling walker Nurse Communication: Mobility status  Activity Tolerance: Patient tolerated treatment well Patient left: in chair;with call bell/phone within reach;with chair alarm set  OT Visit Diagnosis: Unsteadiness on feet (R26.81);Repeated falls (R29.6);Muscle weakness (generalized) (M62.81);History of falling (Z91.81)                Time: 3159-4585 OT Time Calculation (min): 19 min Charges:  OT General Charges $OT Visit: 1 Visit OT Evaluation $OT Eval Moderate Complexity: 1 [address] OTR/L   ASER NYLUND 10/12/2019, 12:42 PM

## 2019-10-12 NOTE — Progress Notes (Signed)
Physical Therapy Treatment Patient Details Name: Michael Riggs MRN: [id number] DOB: 03-07-1971 Today's Date: 10/12/2019    History of Present Illness Pt is a 49 y/o male admitted secondary to fall and CAP. PMH includes ESRD on HD MWF, DM, HTN, L BKA, and CVA.     PT Comments    Patient received in bed, finished breakfast. Agrees to PT session. Patient reports left side rib pain since fall prior to admission. Mod Independent with bed mobility.  Requires set up assist for donning L LE prosthetic.(obtaining from closet.) Patient is able to don independently. He is unable to don right shoe independently. He is very impulsive with sit to stand and ambulation to bathroom and back using RW. Requires physical assist for fall prevention and safety. Cues given to slow down during mobility. Patient will continue to benefit from skilled PT to improve safety with mobility for return home.        Follow Up Recommendations  Home health PT;Supervision for mobility/OOB     Equipment Recommendations  None recommended by PT    Recommendations for Other Services       Precautions / Restrictions Precautions Precautions: Fall Precaution Comments: L BKA Restrictions Weight Bearing Restrictions: No    Mobility  Bed Mobility Overal bed mobility: Modified Independent Bed Mobility: Supine to Sit     Supine to sit: Modified independent (Device/Increase time);HOB elevated     General bed mobility comments: supervision with use of bed rails, increased time and effort  Transfers Overall transfer level: Needs assistance Equipment used: Rolling walker (2 wheeled) Transfers: Sit to/from Stand Sit to Stand: Min assist;+2 safety/equipment         General transfer comment: Patient is impulsive with standing and walking. Cues to slow down and take his time with mobility to increase safety.  Ambulation/Gait Ambulation/Gait assistance: Min assist Gait Distance (Feet): 30 Feet Assistive device:  Rolling walker (2 wheeled) Gait Pattern/deviations: Step-through pattern;Wide base of support Gait velocity: WNL   General Gait Details: patient is impulsive with ambulation. Moves very quickly.   Stairs             Wheelchair Mobility    Modified Rankin (Stroke Patients Only)       Balance Overall balance assessment: Needs assistance;History of Falls Sitting-balance support: Feet supported Sitting balance-Leahy Scale: Good     Standing balance support: Bilateral upper extremity supported;During functional activity Standing balance-Leahy Scale: Poor Standing balance comment: Reliant on BUE support and external assist with ambulation for safety and fall prevention                            Cognition Arousal/Alertness: Awake/alert Behavior During Therapy: WFL for tasks assessed/performed;Impulsive Overall Cognitive Status: History of cognitive impairments - at baseline                                 General Comments: Reports memory deficits since having CVA.       Exercises      General Comments        Pertinent Vitals/Pain Pain Assessment: Faces Faces Pain Scale: Hurts little more Pain Location: L abdomen (rib area) Pain Descriptors / Indicators: Grimacing;Guarding;Sore Pain Intervention(s): Monitored during session    Home Living                      Prior Function  PT Goals (current goals can now be found in the care plan section) Acute Rehab PT Goals Patient Stated Goal: to go home PT Goal Formulation: With patient Time For Goal Achievement: 10/25/19 Potential to Achieve Goals: Good Progress towards PT goals: Progressing toward goals    Frequency    Min 3X/week      PT Plan Current plan remains appropriate    Co-evaluation              AM-PAC PT "6 Clicks" Mobility   Outcome Measure  Help needed turning from your back to your side while in a flat bed without using bedrails?:  None Help needed moving from lying on your back to sitting on the side of a flat bed without using bedrails?: None Help needed moving to and from a bed to a chair (including a wheelchair)?: A Little Help needed standing up from a chair using your arms (e.g., wheelchair or bedside chair)?: A Little Help needed to walk in hospital room?: A Little Help needed climbing 3-5 steps with a railing? : A Lot 6 Click Score: 19    End of Session Equipment Utilized During Treatment: Gait belt Activity Tolerance: Patient tolerated treatment well;Patient limited by fatigue Patient left: in chair;with call bell/phone within reach;with chair alarm set Nurse Communication: Mobility status PT Visit Diagnosis: History of falling (Z91.81);Muscle weakness (generalized) (M62.81);Difficulty in walking, not elsewhere classified (R26.2)     Time: 7371-0626 PT Time Calculation (min) (ACUTE ONLY): 25 min  Charges:  $Gait Training: 8-22 mins $Therapeutic Activity: 8-22 mins                     Jasaun Carn, PT, GCS 10/12/19,10:00 AM

## 2019-10-13 ENCOUNTER — Telehealth (HOSPITAL_COMMUNITY): Payer: Self-pay | Admitting: Nephrology

## 2019-10-13 DIAGNOSIS — E1121 Type 2 diabetes mellitus with diabetic nephropathy: Secondary | ICD-10-CM | POA: Diagnosis not present

## 2019-10-13 DIAGNOSIS — D509 Iron deficiency anemia, unspecified: Secondary | ICD-10-CM | POA: Diagnosis not present

## 2019-10-13 DIAGNOSIS — R52 Pain, unspecified: Secondary | ICD-10-CM | POA: Diagnosis not present

## 2019-10-13 DIAGNOSIS — N186 End stage renal disease: Secondary | ICD-10-CM | POA: Diagnosis not present

## 2019-10-13 DIAGNOSIS — E875 Hyperkalemia: Secondary | ICD-10-CM | POA: Diagnosis not present

## 2019-10-13 DIAGNOSIS — D631 Anemia in chronic kidney disease: Secondary | ICD-10-CM | POA: Diagnosis not present

## 2019-10-13 DIAGNOSIS — D689 Coagulation defect, unspecified: Secondary | ICD-10-CM | POA: Diagnosis not present

## 2019-10-13 DIAGNOSIS — N2581 Secondary hyperparathyroidism of renal origin: Secondary | ICD-10-CM | POA: Diagnosis not present

## 2019-10-13 DIAGNOSIS — Z992 Dependence on renal dialysis: Secondary | ICD-10-CM | POA: Diagnosis not present

## 2019-10-13 NOTE — Telephone Encounter (Signed)
Transition of care contact from inpatient facility  Date of Discharge: 10/12/19 Date of Contact: 10/13/19 - attempted Method of contact: Phone  Attempted to contact patient to discuss transition of care from inpatient admission. Patient did not answer the phone. Message was left on the patient's voicemail with call back number [phone].  Veneta Penton, PA-C [employer] Pager [phone]

## 2019-10-16 LAB — CULTURE, BLOOD (ROUTINE X 2)
Culture: NO GROWTH
Culture: NO GROWTH
Special Requests: ADEQUATE

## 2019-10-17 ENCOUNTER — Telehealth: Payer: Self-pay | Admitting: Nephrology

## 2019-10-17 DIAGNOSIS — M159 Polyosteoarthritis, unspecified: Secondary | ICD-10-CM | POA: Diagnosis not present

## 2019-10-17 DIAGNOSIS — I951 Orthostatic hypotension: Secondary | ICD-10-CM | POA: Diagnosis not present

## 2019-10-17 DIAGNOSIS — E118 Type 2 diabetes mellitus with unspecified complications: Secondary | ICD-10-CM | POA: Diagnosis not present

## 2019-10-17 NOTE — Telephone Encounter (Signed)
Transition of care contact from inpatient facility  Date of discharge: 10/12/19 Date of contact: 10/17/19 Method: Phone Spoke to: Patient  Patient contacted to discuss transition of care from recent inpatient hospitalization. Patient was admitted to [facility] from 3/23  to 3/25 with discharge diagnosis of pneumonia. He has completed his antibiotic course.   Medication changes were reviewed.  Patient will follow up with his/her outpatient HD unit on tomorrow 3/31. He did not go to dialysis on Monday because he felt too weak. Stressed importance of going to dialysis tomorrow.   Other f/u needs include: None at this time

## 2019-10-18 DIAGNOSIS — D631 Anemia in chronic kidney disease: Secondary | ICD-10-CM | POA: Diagnosis not present

## 2019-10-18 DIAGNOSIS — E1121 Type 2 diabetes mellitus with diabetic nephropathy: Secondary | ICD-10-CM | POA: Diagnosis not present

## 2019-10-18 DIAGNOSIS — D689 Coagulation defect, unspecified: Secondary | ICD-10-CM | POA: Diagnosis not present

## 2019-10-18 DIAGNOSIS — N2581 Secondary hyperparathyroidism of renal origin: Secondary | ICD-10-CM | POA: Diagnosis not present

## 2019-10-18 DIAGNOSIS — Z992 Dependence on renal dialysis: Secondary | ICD-10-CM | POA: Diagnosis not present

## 2019-10-18 DIAGNOSIS — D509 Iron deficiency anemia, unspecified: Secondary | ICD-10-CM | POA: Diagnosis not present

## 2019-10-18 DIAGNOSIS — N186 End stage renal disease: Secondary | ICD-10-CM | POA: Diagnosis not present

## 2019-10-18 DIAGNOSIS — E875 Hyperkalemia: Secondary | ICD-10-CM | POA: Diagnosis not present

## 2019-10-18 DIAGNOSIS — E1122 Type 2 diabetes mellitus with diabetic chronic kidney disease: Secondary | ICD-10-CM | POA: Diagnosis not present

## 2019-10-18 DIAGNOSIS — R52 Pain, unspecified: Secondary | ICD-10-CM | POA: Diagnosis not present

## 2019-10-19 DIAGNOSIS — G894 Chronic pain syndrome: Secondary | ICD-10-CM | POA: Diagnosis not present

## 2019-10-19 DIAGNOSIS — R269 Unspecified abnormalities of gait and mobility: Secondary | ICD-10-CM | POA: Diagnosis not present

## 2019-10-19 DIAGNOSIS — R262 Difficulty in walking, not elsewhere classified: Secondary | ICD-10-CM | POA: Diagnosis not present

## 2019-10-19 DIAGNOSIS — G8929 Other chronic pain: Secondary | ICD-10-CM | POA: Diagnosis not present

## 2019-10-19 DIAGNOSIS — E118 Type 2 diabetes mellitus with unspecified complications: Secondary | ICD-10-CM | POA: Diagnosis not present

## 2019-10-19 DIAGNOSIS — E785 Hyperlipidemia, unspecified: Secondary | ICD-10-CM | POA: Diagnosis not present

## 2019-10-19 DIAGNOSIS — Z89512 Acquired absence of left leg below knee: Secondary | ICD-10-CM | POA: Diagnosis not present

## 2019-10-19 DIAGNOSIS — M159 Polyosteoarthritis, unspecified: Secondary | ICD-10-CM | POA: Diagnosis not present

## 2019-10-19 DIAGNOSIS — I951 Orthostatic hypotension: Secondary | ICD-10-CM | POA: Diagnosis not present

## 2019-10-19 DIAGNOSIS — M6281 Muscle weakness (generalized): Secondary | ICD-10-CM | POA: Diagnosis not present

## 2019-10-20 DIAGNOSIS — Z992 Dependence on renal dialysis: Secondary | ICD-10-CM | POA: Diagnosis not present

## 2019-10-20 DIAGNOSIS — N2581 Secondary hyperparathyroidism of renal origin: Secondary | ICD-10-CM | POA: Diagnosis not present

## 2019-10-20 DIAGNOSIS — E875 Hyperkalemia: Secondary | ICD-10-CM | POA: Diagnosis not present

## 2019-10-20 DIAGNOSIS — E1121 Type 2 diabetes mellitus with diabetic nephropathy: Secondary | ICD-10-CM | POA: Diagnosis not present

## 2019-10-20 DIAGNOSIS — D509 Iron deficiency anemia, unspecified: Secondary | ICD-10-CM | POA: Diagnosis not present

## 2019-10-20 DIAGNOSIS — N186 End stage renal disease: Secondary | ICD-10-CM | POA: Diagnosis not present

## 2019-10-20 DIAGNOSIS — R52 Pain, unspecified: Secondary | ICD-10-CM | POA: Diagnosis not present

## 2019-10-23 DIAGNOSIS — D509 Iron deficiency anemia, unspecified: Secondary | ICD-10-CM | POA: Diagnosis not present

## 2019-10-23 DIAGNOSIS — N2581 Secondary hyperparathyroidism of renal origin: Secondary | ICD-10-CM | POA: Diagnosis not present

## 2019-10-23 DIAGNOSIS — E875 Hyperkalemia: Secondary | ICD-10-CM | POA: Diagnosis not present

## 2019-10-23 DIAGNOSIS — Z992 Dependence on renal dialysis: Secondary | ICD-10-CM | POA: Diagnosis not present

## 2019-10-23 DIAGNOSIS — N186 End stage renal disease: Secondary | ICD-10-CM | POA: Diagnosis not present

## 2019-10-23 DIAGNOSIS — R52 Pain, unspecified: Secondary | ICD-10-CM | POA: Diagnosis not present

## 2019-10-23 DIAGNOSIS — E1121 Type 2 diabetes mellitus with diabetic nephropathy: Secondary | ICD-10-CM | POA: Diagnosis not present

## 2019-10-25 DIAGNOSIS — E875 Hyperkalemia: Secondary | ICD-10-CM | POA: Diagnosis not present

## 2019-10-25 DIAGNOSIS — N186 End stage renal disease: Secondary | ICD-10-CM | POA: Diagnosis not present

## 2019-10-25 DIAGNOSIS — Z992 Dependence on renal dialysis: Secondary | ICD-10-CM | POA: Diagnosis not present

## 2019-10-25 DIAGNOSIS — N2581 Secondary hyperparathyroidism of renal origin: Secondary | ICD-10-CM | POA: Diagnosis not present

## 2019-10-25 DIAGNOSIS — R52 Pain, unspecified: Secondary | ICD-10-CM | POA: Diagnosis not present

## 2019-10-25 DIAGNOSIS — E1121 Type 2 diabetes mellitus with diabetic nephropathy: Secondary | ICD-10-CM | POA: Diagnosis not present

## 2019-10-25 DIAGNOSIS — D509 Iron deficiency anemia, unspecified: Secondary | ICD-10-CM | POA: Diagnosis not present

## 2019-10-27 DIAGNOSIS — D509 Iron deficiency anemia, unspecified: Secondary | ICD-10-CM | POA: Diagnosis not present

## 2019-10-27 DIAGNOSIS — N186 End stage renal disease: Secondary | ICD-10-CM | POA: Diagnosis not present

## 2019-10-27 DIAGNOSIS — R52 Pain, unspecified: Secondary | ICD-10-CM | POA: Diagnosis not present

## 2019-10-27 DIAGNOSIS — E1121 Type 2 diabetes mellitus with diabetic nephropathy: Secondary | ICD-10-CM | POA: Diagnosis not present

## 2019-10-27 DIAGNOSIS — Z992 Dependence on renal dialysis: Secondary | ICD-10-CM | POA: Diagnosis not present

## 2019-10-27 DIAGNOSIS — E875 Hyperkalemia: Secondary | ICD-10-CM | POA: Diagnosis not present

## 2019-10-27 DIAGNOSIS — N2581 Secondary hyperparathyroidism of renal origin: Secondary | ICD-10-CM | POA: Diagnosis not present

## 2019-10-29 DIAGNOSIS — J0100 Acute maxillary sinusitis, unspecified: Secondary | ICD-10-CM | POA: Diagnosis not present

## 2019-10-30 DIAGNOSIS — R52 Pain, unspecified: Secondary | ICD-10-CM | POA: Diagnosis not present

## 2019-10-30 DIAGNOSIS — E1121 Type 2 diabetes mellitus with diabetic nephropathy: Secondary | ICD-10-CM | POA: Diagnosis not present

## 2019-10-30 DIAGNOSIS — Z992 Dependence on renal dialysis: Secondary | ICD-10-CM | POA: Diagnosis not present

## 2019-10-30 DIAGNOSIS — E875 Hyperkalemia: Secondary | ICD-10-CM | POA: Diagnosis not present

## 2019-10-30 DIAGNOSIS — D509 Iron deficiency anemia, unspecified: Secondary | ICD-10-CM | POA: Diagnosis not present

## 2019-10-30 DIAGNOSIS — N2581 Secondary hyperparathyroidism of renal origin: Secondary | ICD-10-CM | POA: Diagnosis not present

## 2019-10-30 DIAGNOSIS — N186 End stage renal disease: Secondary | ICD-10-CM | POA: Diagnosis not present

## 2019-11-01 DIAGNOSIS — R52 Pain, unspecified: Secondary | ICD-10-CM | POA: Diagnosis not present

## 2019-11-01 DIAGNOSIS — N186 End stage renal disease: Secondary | ICD-10-CM | POA: Diagnosis not present

## 2019-11-01 DIAGNOSIS — N2581 Secondary hyperparathyroidism of renal origin: Secondary | ICD-10-CM | POA: Diagnosis not present

## 2019-11-01 DIAGNOSIS — E875 Hyperkalemia: Secondary | ICD-10-CM | POA: Diagnosis not present

## 2019-11-01 DIAGNOSIS — E1121 Type 2 diabetes mellitus with diabetic nephropathy: Secondary | ICD-10-CM | POA: Diagnosis not present

## 2019-11-01 DIAGNOSIS — Z992 Dependence on renal dialysis: Secondary | ICD-10-CM | POA: Diagnosis not present

## 2019-11-01 DIAGNOSIS — D509 Iron deficiency anemia, unspecified: Secondary | ICD-10-CM | POA: Diagnosis not present

## 2019-11-03 DIAGNOSIS — N2581 Secondary hyperparathyroidism of renal origin: Secondary | ICD-10-CM | POA: Diagnosis not present

## 2019-11-03 DIAGNOSIS — N186 End stage renal disease: Secondary | ICD-10-CM | POA: Diagnosis not present

## 2019-11-03 DIAGNOSIS — D509 Iron deficiency anemia, unspecified: Secondary | ICD-10-CM | POA: Diagnosis not present

## 2019-11-03 DIAGNOSIS — R52 Pain, unspecified: Secondary | ICD-10-CM | POA: Diagnosis not present

## 2019-11-03 DIAGNOSIS — Z992 Dependence on renal dialysis: Secondary | ICD-10-CM | POA: Diagnosis not present

## 2019-11-03 DIAGNOSIS — E1121 Type 2 diabetes mellitus with diabetic nephropathy: Secondary | ICD-10-CM | POA: Diagnosis not present

## 2019-11-03 DIAGNOSIS — E875 Hyperkalemia: Secondary | ICD-10-CM | POA: Diagnosis not present

## 2019-11-05 ENCOUNTER — Other Ambulatory Visit: Payer: Self-pay

## 2019-11-05 ENCOUNTER — Emergency Department (HOSPITAL_COMMUNITY): Payer: Medicare Other

## 2019-11-05 ENCOUNTER — Inpatient Hospital Stay (HOSPITAL_COMMUNITY)
Admission: EM | Admit: 2019-11-05 | Discharge: 2019-11-10 | DRG: 193 | Disposition: A | Discharge status: Home / Self Care | Payer: Medicare Other | Attending: Internal Medicine | Admitting: Internal Medicine

## 2019-11-05 ENCOUNTER — Encounter (HOSPITAL_COMMUNITY): Payer: Self-pay

## 2019-11-05 DIAGNOSIS — E1151 Type 2 diabetes mellitus with diabetic peripheral angiopathy without gangrene: Secondary | ICD-10-CM | POA: Diagnosis not present

## 2019-11-05 DIAGNOSIS — K219 Gastro-esophageal reflux disease without esophagitis: Secondary | ICD-10-CM | POA: Diagnosis present

## 2019-11-05 DIAGNOSIS — D638 Anemia in other chronic diseases classified elsewhere: Secondary | ICD-10-CM | POA: Diagnosis present

## 2019-11-05 DIAGNOSIS — D631 Anemia in chronic kidney disease: Secondary | ICD-10-CM | POA: Diagnosis present

## 2019-11-05 DIAGNOSIS — Z8701 Personal history of pneumonia (recurrent): Secondary | ICD-10-CM

## 2019-11-05 DIAGNOSIS — Z79899 Other long term (current) drug therapy: Secondary | ICD-10-CM | POA: Diagnosis not present

## 2019-11-05 DIAGNOSIS — Z993 Dependence on wheelchair: Secondary | ICD-10-CM | POA: Diagnosis not present

## 2019-11-05 DIAGNOSIS — Z9714 Presence of artificial left leg (complete) (partial): Secondary | ICD-10-CM

## 2019-11-05 DIAGNOSIS — I9589 Other hypotension: Secondary | ICD-10-CM | POA: Diagnosis not present

## 2019-11-05 DIAGNOSIS — Z87891 Personal history of nicotine dependence: Secondary | ICD-10-CM | POA: Diagnosis not present

## 2019-11-05 DIAGNOSIS — Z20822 Contact with and (suspected) exposure to covid-19: Secondary | ICD-10-CM | POA: Diagnosis not present

## 2019-11-05 DIAGNOSIS — R0602 Shortness of breath: Secondary | ICD-10-CM | POA: Diagnosis not present

## 2019-11-05 DIAGNOSIS — R0600 Dyspnea, unspecified: Secondary | ICD-10-CM

## 2019-11-05 DIAGNOSIS — N186 End stage renal disease: Secondary | ICD-10-CM

## 2019-11-05 DIAGNOSIS — F329 Major depressive disorder, single episode, unspecified: Secondary | ICD-10-CM | POA: Diagnosis present

## 2019-11-05 DIAGNOSIS — R339 Retention of urine, unspecified: Secondary | ICD-10-CM | POA: Diagnosis not present

## 2019-11-05 DIAGNOSIS — E162 Hypoglycemia, unspecified: Secondary | ICD-10-CM | POA: Diagnosis present

## 2019-11-05 DIAGNOSIS — Z992 Dependence on renal dialysis: Secondary | ICD-10-CM | POA: Diagnosis not present

## 2019-11-05 DIAGNOSIS — Z8673 Personal history of transient ischemic attack (TIA), and cerebral infarction without residual deficits: Secondary | ICD-10-CM

## 2019-11-05 DIAGNOSIS — E785 Hyperlipidemia, unspecified: Secondary | ICD-10-CM | POA: Diagnosis present

## 2019-11-05 DIAGNOSIS — Z8639 Personal history of other endocrine, nutritional and metabolic disease: Secondary | ICD-10-CM

## 2019-11-05 DIAGNOSIS — Z833 Family history of diabetes mellitus: Secondary | ICD-10-CM

## 2019-11-05 DIAGNOSIS — J181 Lobar pneumonia, unspecified organism: Principal | ICD-10-CM | POA: Diagnosis present

## 2019-11-05 DIAGNOSIS — J9601 Acute respiratory failure with hypoxia: Secondary | ICD-10-CM | POA: Diagnosis present

## 2019-11-05 DIAGNOSIS — Z89512 Acquired absence of left leg below knee: Secondary | ICD-10-CM | POA: Diagnosis not present

## 2019-11-05 DIAGNOSIS — G4733 Obstructive sleep apnea (adult) (pediatric): Secondary | ICD-10-CM | POA: Diagnosis present

## 2019-11-05 DIAGNOSIS — J918 Pleural effusion in other conditions classified elsewhere: Secondary | ICD-10-CM | POA: Diagnosis present

## 2019-11-05 DIAGNOSIS — I10 Essential (primary) hypertension: Secondary | ICD-10-CM | POA: Diagnosis present

## 2019-11-05 DIAGNOSIS — Z807 Family history of other malignant neoplasms of lymphoid, hematopoietic and related tissues: Secondary | ICD-10-CM

## 2019-11-05 DIAGNOSIS — J90 Pleural effusion, not elsewhere classified: Secondary | ICD-10-CM

## 2019-11-05 DIAGNOSIS — E8889 Other specified metabolic disorders: Secondary | ICD-10-CM | POA: Diagnosis present

## 2019-11-05 DIAGNOSIS — I120 Hypertensive chronic kidney disease with stage 5 chronic kidney disease or end stage renal disease: Secondary | ICD-10-CM | POA: Diagnosis not present

## 2019-11-05 LAB — CBC
HCT: 38.3 % — ABNORMAL LOW (ref 39.0–52.0)
Hemoglobin: 11.7 g/dL — ABNORMAL LOW (ref 13.0–17.0)
MCH: 29.1 pg (ref 26.0–34.0)
MCHC: 30.5 g/dL (ref 30.0–36.0)
MCV: 95.3 fL (ref 80.0–100.0)
Platelets: 230 10*3/uL (ref 150–400)
RBC: 4.02 MIL/uL — ABNORMAL LOW (ref 4.22–5.81)
RDW: 16.3 % — ABNORMAL HIGH (ref 11.5–15.5)
WBC: 6.1 10*3/uL (ref 4.0–10.5)
nRBC: 0 % (ref 0.0–0.2)

## 2019-11-05 LAB — BASIC METABOLIC PANEL
Anion gap: 16 — ABNORMAL HIGH (ref 5–15)
BUN: 35 mg/dL — ABNORMAL HIGH (ref 6–20)
CO2: 26 mmol/L (ref 22–32)
Calcium: 8.7 mg/dL — ABNORMAL LOW (ref 8.9–10.3)
Chloride: 96 mmol/L — ABNORMAL LOW (ref 98–111)
Creatinine, Ser: 7.92 mg/dL — ABNORMAL HIGH (ref 0.61–1.24)
GFR calc Af Amer: 8 mL/min — ABNORMAL LOW (ref >60)
GFR calc non Af Amer: 7 mL/min — ABNORMAL LOW (ref >60)
Glucose, Bld: 136 mg/dL — ABNORMAL HIGH (ref 70–99)
Potassium: 4 mmol/L (ref 3.5–5.1)
Sodium: 138 mmol/L (ref 135–145)

## 2019-11-05 LAB — TROPONIN I (HIGH SENSITIVITY): Troponin I (High Sensitivity): 9 ng/L (ref <18)

## 2019-11-05 IMAGING — CR DG CHEST 2V
2 series · 2 of 2 positions shown · non-contrast
Comparison: [DATE]

CLINICAL DATA: Pneumonia, shortness of breath

EXAM:
CHEST - 2 VIEW

[chest pa]
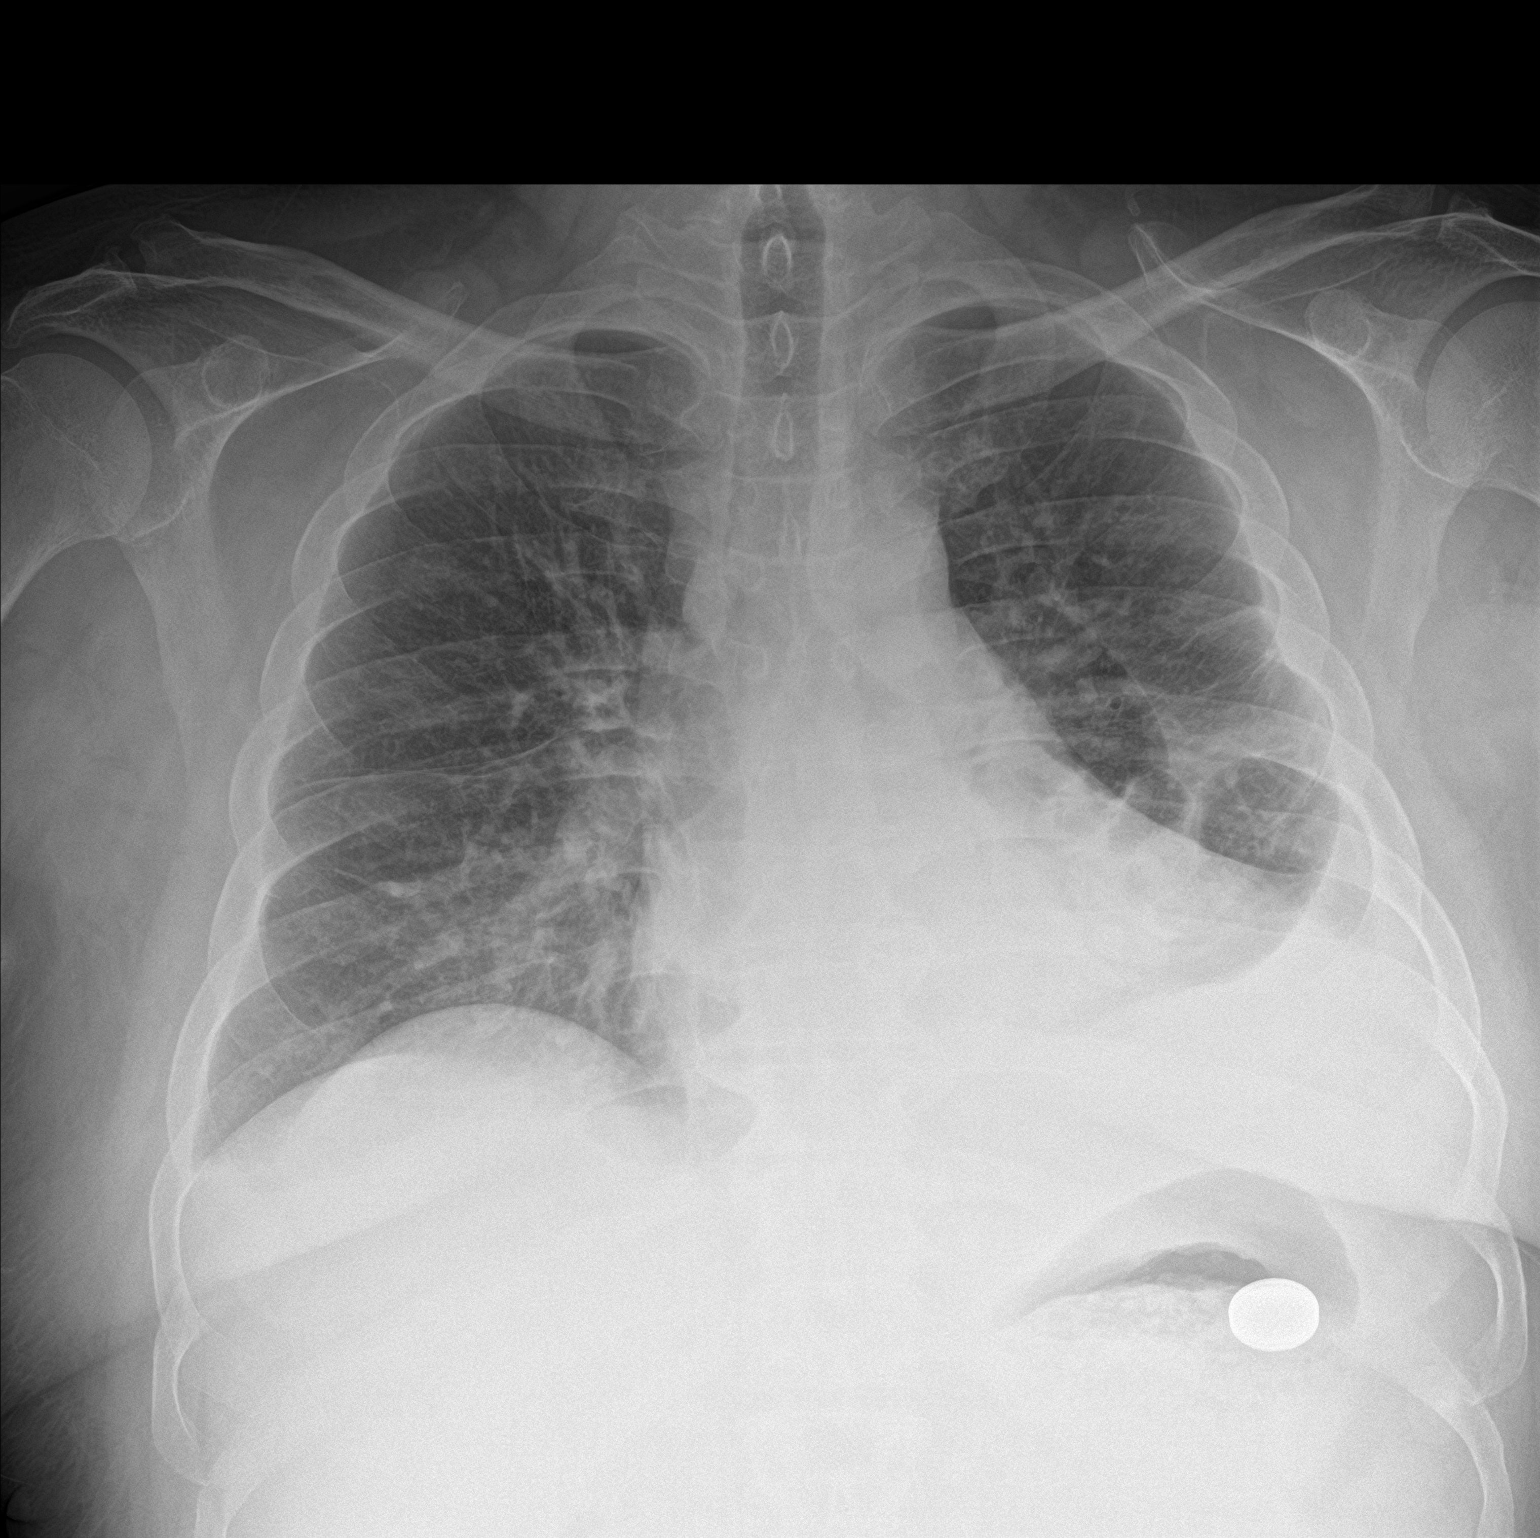

[chest lat]
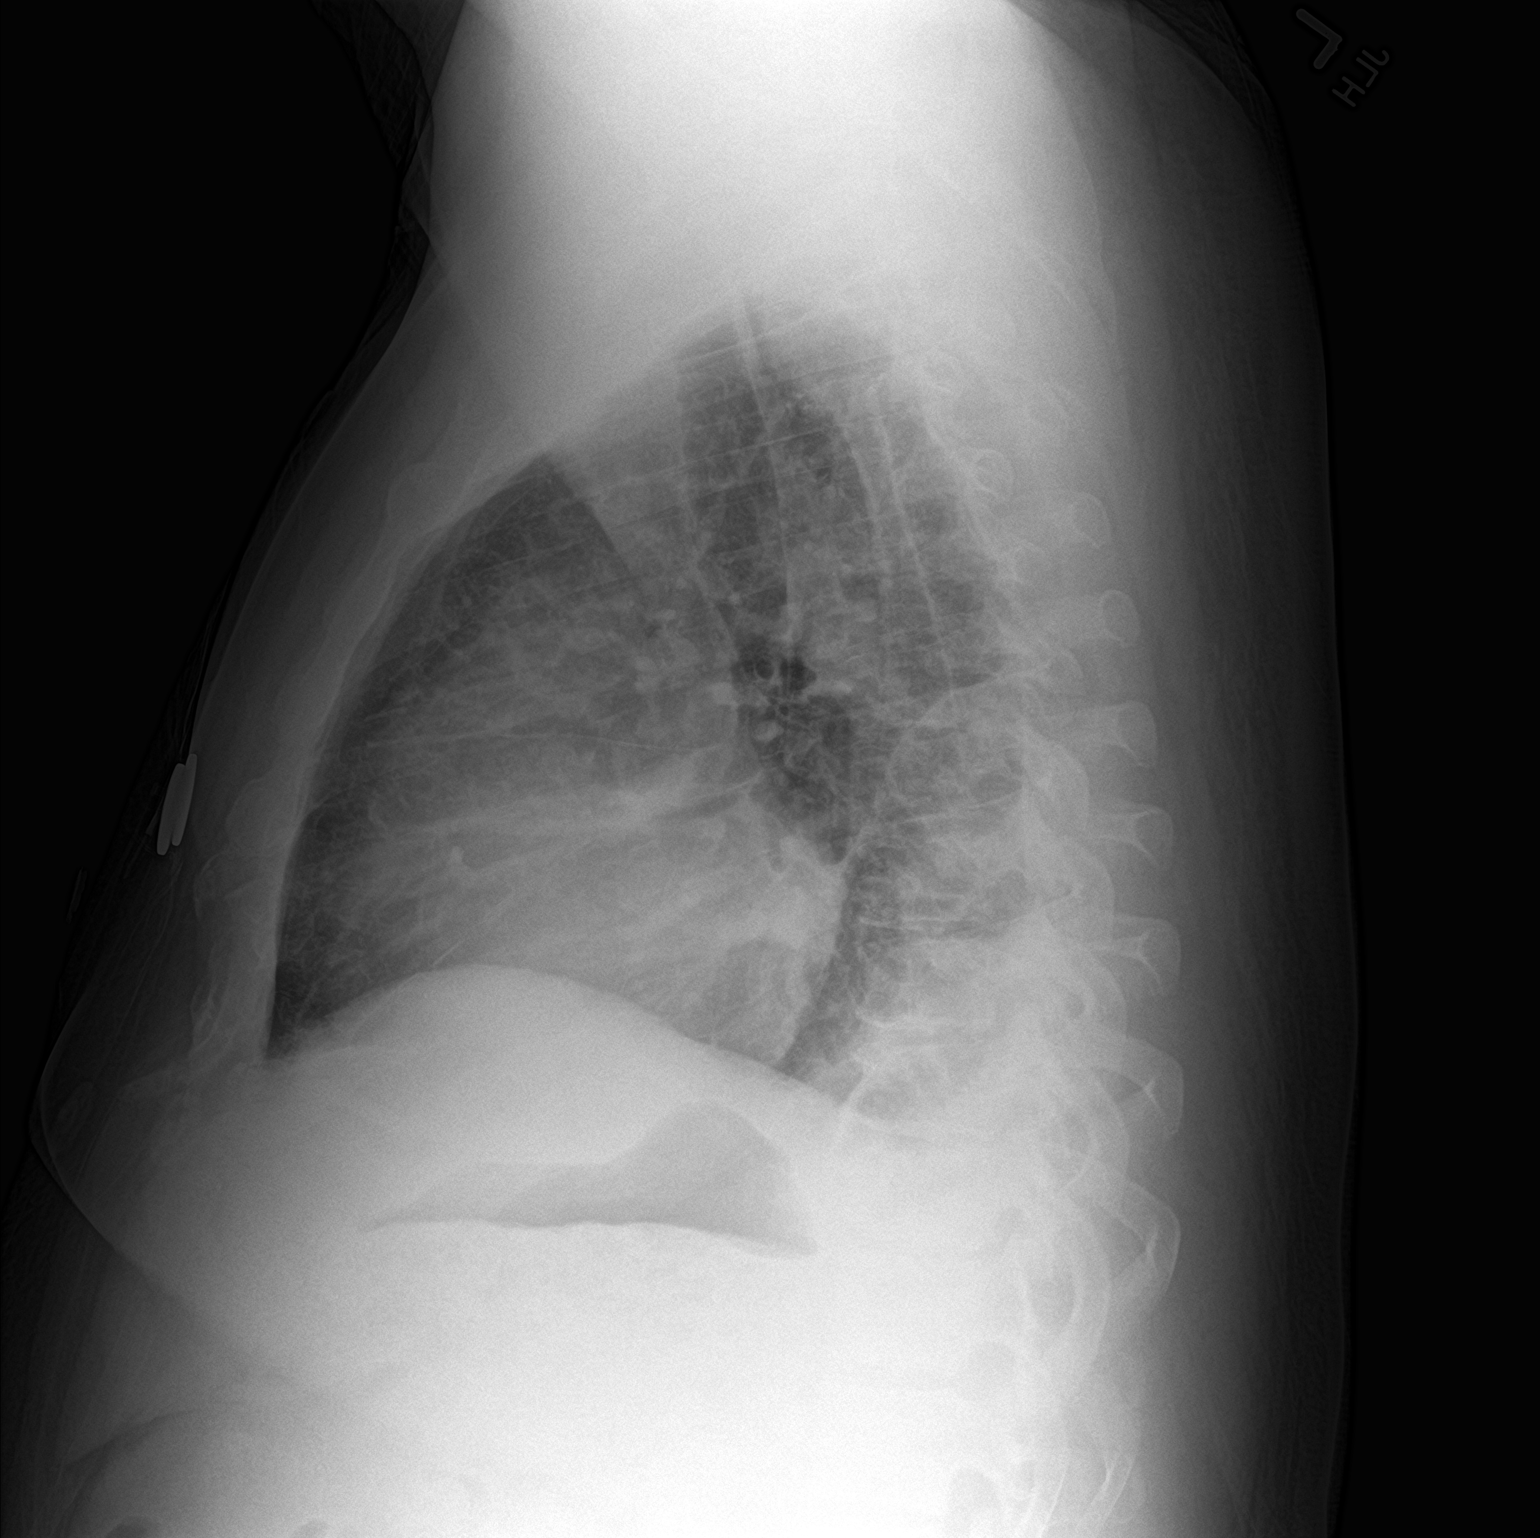

[2 of 2 positions shown; findings below may reference images not displayed]

FINDINGS: Small left pleural effusion. Left lower lung atelectasis or
infiltrate again noted, similar to prior study. Right lung clear.
Heart is borderline in size.
IMPRESSION: Continued small left pleural effusion with left lower lung
atelectasis or infiltrate/pneumonia.

## 2019-11-05 MED ORDER — SODIUM CHLORIDE 0.9% FLUSH
3.0000 mL | Freq: Once | INTRAVENOUS | Status: AC
  Administered 2019-11-06: 06:00:00 3 mL via INTRAVENOUS

## 2019-11-05 NOTE — ED Triage Notes (Signed)
Pt arrives to ED w/ c/o SOB. Pt states recently diagnosed w/ PNA. Pt endorses SOB, night sweats, chills since receiving covid vaccine on Friday. Pt is MWF dialysis pt. Pt afebrile in triage, VSS.

## 2019-11-06 ENCOUNTER — Encounter (HOSPITAL_COMMUNITY): Payer: Self-pay | Admitting: Internal Medicine

## 2019-11-06 ENCOUNTER — Emergency Department (HOSPITAL_COMMUNITY): Payer: Medicare Other

## 2019-11-06 DIAGNOSIS — J918 Pleural effusion in other conditions classified elsewhere: Secondary | ICD-10-CM | POA: Diagnosis present

## 2019-11-06 DIAGNOSIS — R339 Retention of urine, unspecified: Secondary | ICD-10-CM | POA: Diagnosis present

## 2019-11-06 DIAGNOSIS — N186 End stage renal disease: Secondary | ICD-10-CM

## 2019-11-06 DIAGNOSIS — D638 Anemia in other chronic diseases classified elsewhere: Secondary | ICD-10-CM | POA: Diagnosis not present

## 2019-11-06 DIAGNOSIS — E785 Hyperlipidemia, unspecified: Secondary | ICD-10-CM | POA: Diagnosis present

## 2019-11-06 DIAGNOSIS — G4733 Obstructive sleep apnea (adult) (pediatric): Secondary | ICD-10-CM | POA: Diagnosis present

## 2019-11-06 DIAGNOSIS — R918 Other nonspecific abnormal finding of lung field: Secondary | ICD-10-CM | POA: Diagnosis not present

## 2019-11-06 DIAGNOSIS — E8889 Other specified metabolic disorders: Secondary | ICD-10-CM | POA: Diagnosis present

## 2019-11-06 DIAGNOSIS — Z89512 Acquired absence of left leg below knee: Secondary | ICD-10-CM | POA: Diagnosis not present

## 2019-11-06 DIAGNOSIS — Z8639 Personal history of other endocrine, nutritional and metabolic disease: Secondary | ICD-10-CM | POA: Diagnosis not present

## 2019-11-06 DIAGNOSIS — I9589 Other hypotension: Secondary | ICD-10-CM | POA: Diagnosis present

## 2019-11-06 DIAGNOSIS — Z993 Dependence on wheelchair: Secondary | ICD-10-CM | POA: Diagnosis not present

## 2019-11-06 DIAGNOSIS — E1151 Type 2 diabetes mellitus with diabetic peripheral angiopathy without gangrene: Secondary | ICD-10-CM | POA: Diagnosis not present

## 2019-11-06 DIAGNOSIS — Z807 Family history of other malignant neoplasms of lymphoid, hematopoietic and related tissues: Secondary | ICD-10-CM | POA: Diagnosis not present

## 2019-11-06 DIAGNOSIS — J181 Lobar pneumonia, unspecified organism: Secondary | ICD-10-CM | POA: Diagnosis present

## 2019-11-06 DIAGNOSIS — Z992 Dependence on renal dialysis: Secondary | ICD-10-CM | POA: Diagnosis not present

## 2019-11-06 DIAGNOSIS — J9601 Acute respiratory failure with hypoxia: Secondary | ICD-10-CM | POA: Diagnosis not present

## 2019-11-06 DIAGNOSIS — Z79899 Other long term (current) drug therapy: Secondary | ICD-10-CM | POA: Diagnosis not present

## 2019-11-06 DIAGNOSIS — K219 Gastro-esophageal reflux disease without esophagitis: Secondary | ICD-10-CM | POA: Diagnosis present

## 2019-11-06 DIAGNOSIS — Z20822 Contact with and (suspected) exposure to covid-19: Secondary | ICD-10-CM | POA: Diagnosis present

## 2019-11-06 DIAGNOSIS — R0602 Shortness of breath: Secondary | ICD-10-CM | POA: Diagnosis not present

## 2019-11-06 DIAGNOSIS — F329 Major depressive disorder, single episode, unspecified: Secondary | ICD-10-CM | POA: Diagnosis present

## 2019-11-06 DIAGNOSIS — I120 Hypertensive chronic kidney disease with stage 5 chronic kidney disease or end stage renal disease: Secondary | ICD-10-CM | POA: Diagnosis not present

## 2019-11-06 DIAGNOSIS — J90 Pleural effusion, not elsewhere classified: Secondary | ICD-10-CM | POA: Diagnosis not present

## 2019-11-06 DIAGNOSIS — E162 Hypoglycemia, unspecified: Secondary | ICD-10-CM | POA: Diagnosis present

## 2019-11-06 DIAGNOSIS — Z833 Family history of diabetes mellitus: Secondary | ICD-10-CM | POA: Diagnosis not present

## 2019-11-06 DIAGNOSIS — E1129 Type 2 diabetes mellitus with other diabetic kidney complication: Secondary | ICD-10-CM | POA: Diagnosis not present

## 2019-11-06 DIAGNOSIS — D631 Anemia in chronic kidney disease: Secondary | ICD-10-CM | POA: Diagnosis present

## 2019-11-06 DIAGNOSIS — I10 Essential (primary) hypertension: Secondary | ICD-10-CM | POA: Diagnosis not present

## 2019-11-06 DIAGNOSIS — Z87891 Personal history of nicotine dependence: Secondary | ICD-10-CM | POA: Diagnosis not present

## 2019-11-06 LAB — CBG MONITORING, ED
Glucose-Capillary: 65 mg/dL — ABNORMAL LOW (ref 70–99)
Glucose-Capillary: 153 mg/dL — ABNORMAL HIGH (ref 70–99)

## 2019-11-06 LAB — RESPIRATORY PANEL BY RT PCR (FLU A&B, COVID)
Influenza A by PCR: NEGATIVE
Influenza B by PCR: NEGATIVE
SARS Coronavirus 2 by RT PCR: NEGATIVE

## 2019-11-06 LAB — GLUCOSE, CAPILLARY
Glucose-Capillary: 103 mg/dL — ABNORMAL HIGH (ref 70–99)
Glucose-Capillary: 119 mg/dL — ABNORMAL HIGH (ref 70–99)
Glucose-Capillary: 88 mg/dL (ref 70–99)

## 2019-11-06 LAB — TROPONIN I (HIGH SENSITIVITY): Troponin I (High Sensitivity): 10 ng/L (ref <18)

## 2019-11-06 IMAGING — CT CT ANGIO CHEST
2 of 7 series · 17 of 46 positions shown · IV contrast (APPLIED)
Comparison: CTA chest [DATE]. Chest radiographs yesterday.

CLINICAL DATA: 48-year-old male with shortness of breath.

EXAM:
CT ANGIOGRAPHY CHEST WITH CONTRAST
TECHNIQUE: Multidetector CT imaging of the chest was performed using the
standard protocol during bolus administration of intravenous
contrast. Multiplanar CT image reconstructions and MIPs were
obtained to evaluate the vascular anatomy.
CONTRAST:  100mL OMNIPAQUE IOHEXOL 350 MG/ML SOLN

[Series 6: thins · axial · 0.79mm/px · z∈[-292,-43]mm · 14 of 401 slices shown]
[im 23/401  lung]
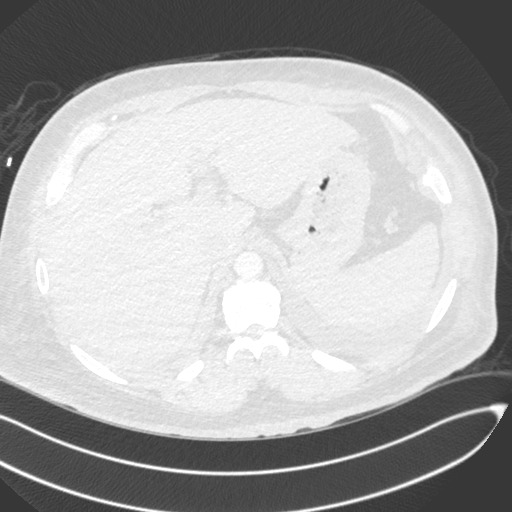
[im 45/401  soft-tissue]
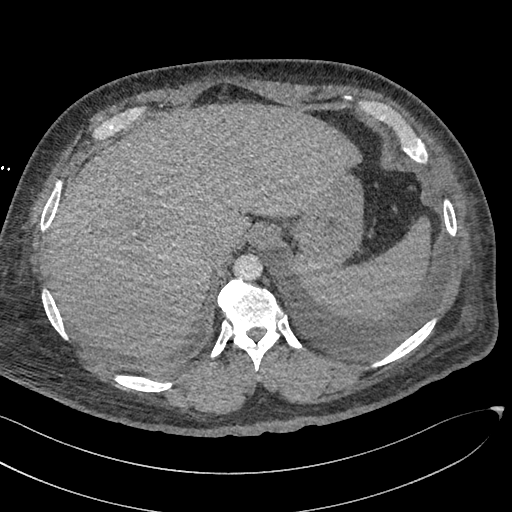
[im 89/401  lung]
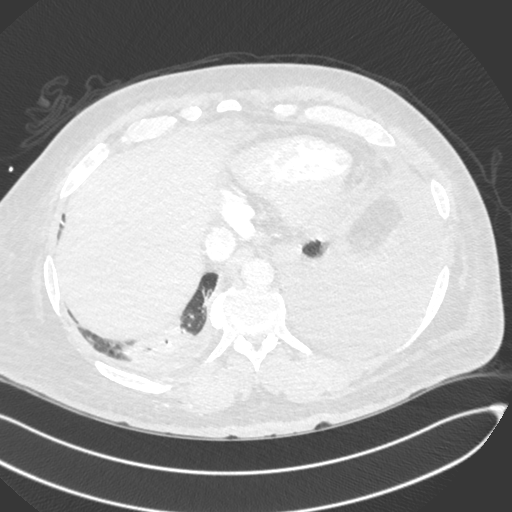
[im 112/401  soft-tissue]
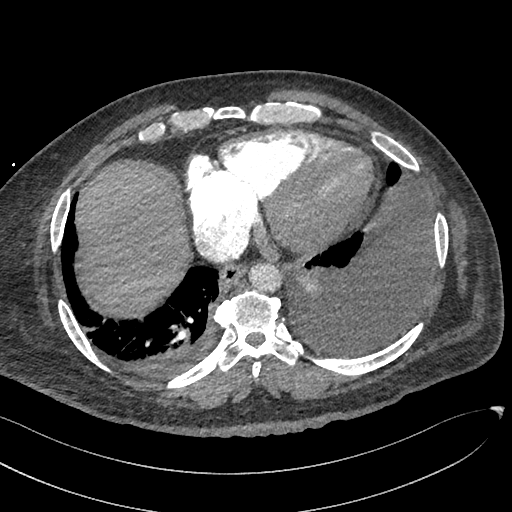
[im 134/401  lung]
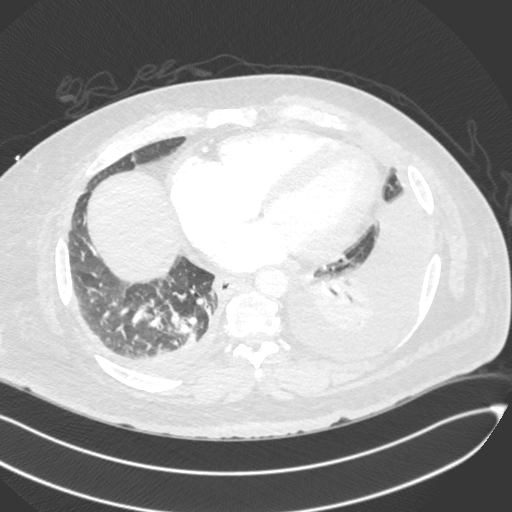
[im 156/401  soft-tissue]
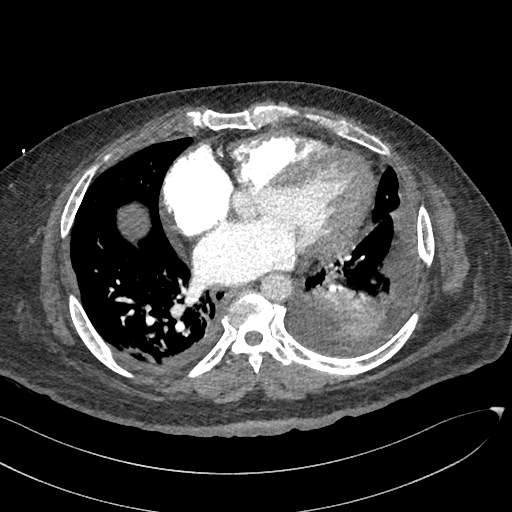
[im 178/401  lung]
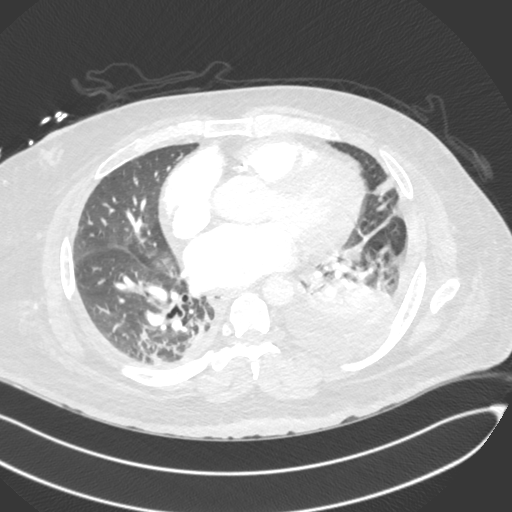
[im 223/401  soft-tissue]
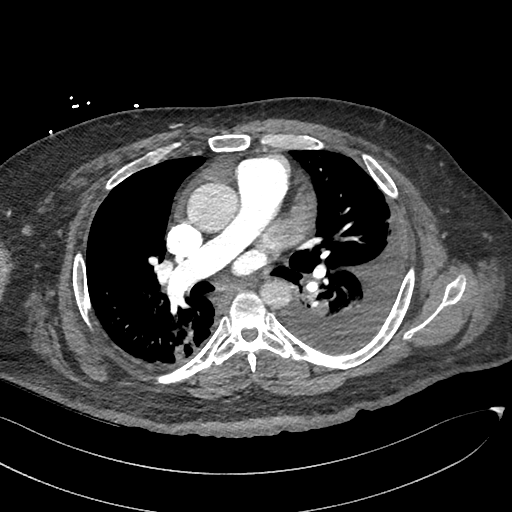
[im 245/401  lung]
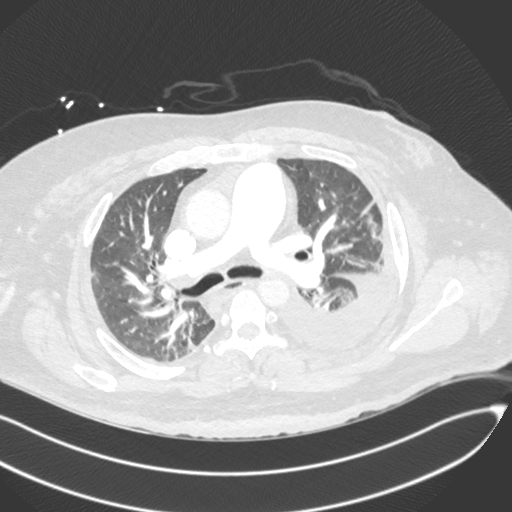
[im 267/401  soft-tissue]
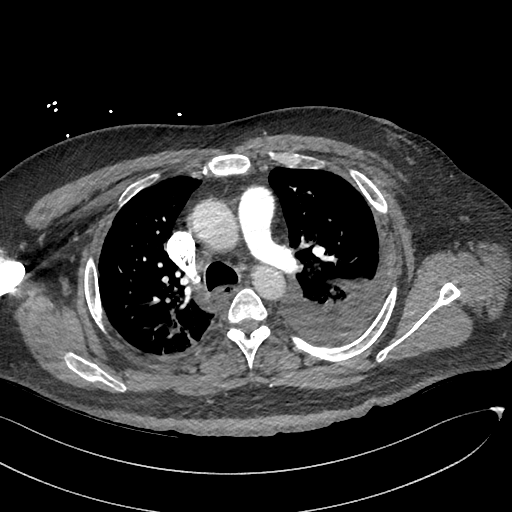
[im 289/401  lung]
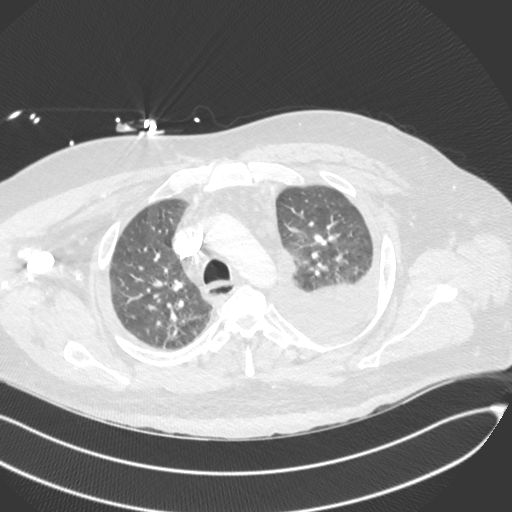
[im 312/401  soft-tissue]
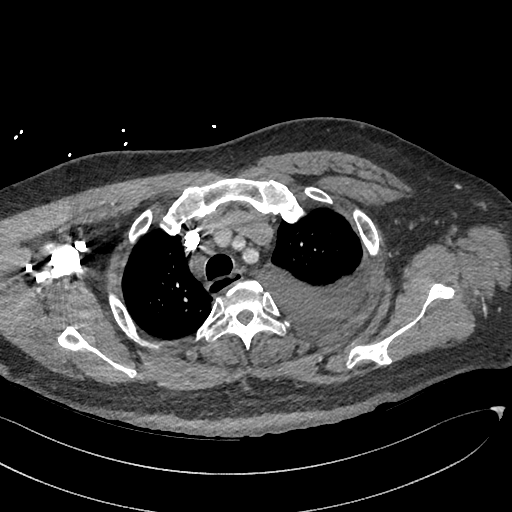
[im 356/401  lung]
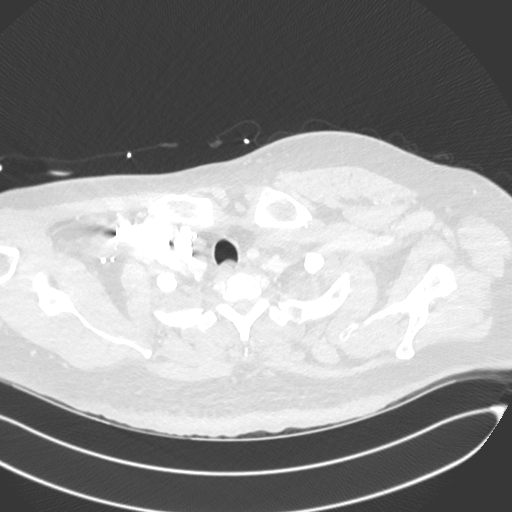
[im 378/401  soft-tissue]
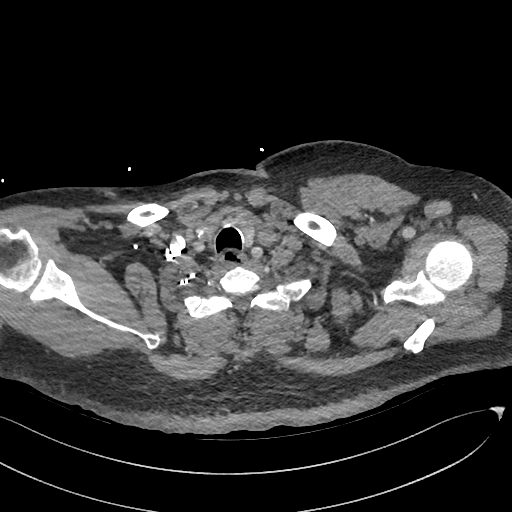

[Series 8: cor · coronal · 0.57mm/px · 3 of 151 slices shown]
[im 38/151  soft-tissue]
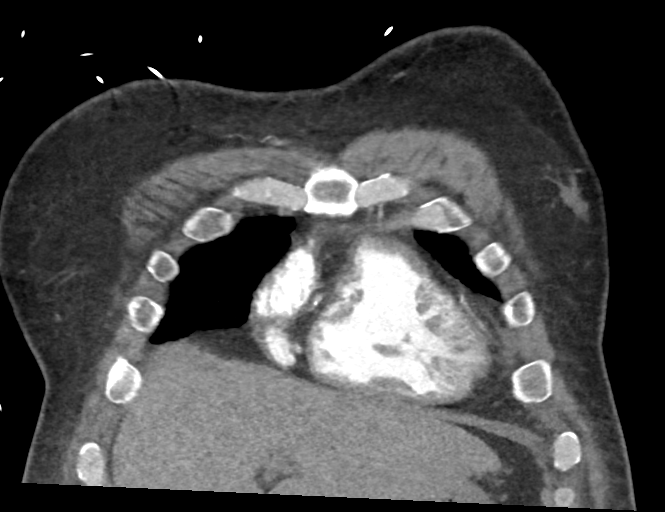
[im 76/151  soft-tissue]
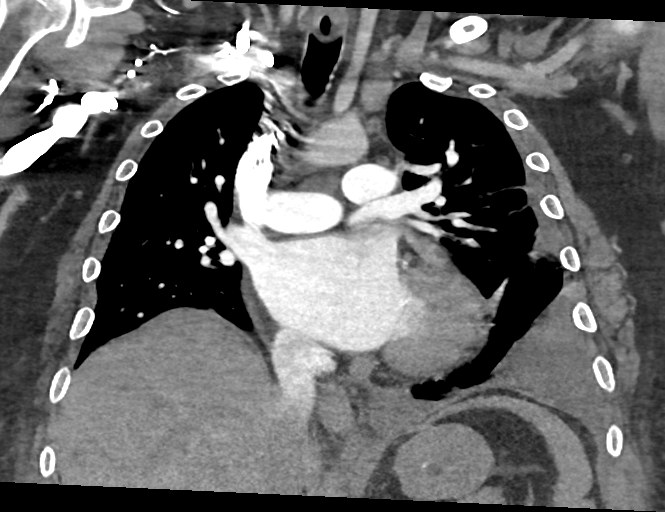
[im 113/151  soft-tissue]
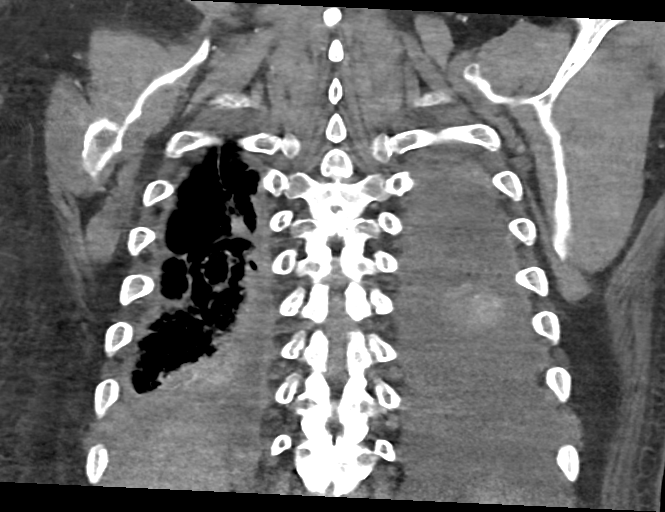

[17 of 46 positions shown; findings below may reference images not displayed]

FINDINGS: Cardiovascular: Good contrast bolus timing in the pulmonary arterial
tree. Stable mild central pulmonary artery enlargement since
[DATE], image 153). Mild respiratory motion.

No focal filling defect identified in the pulmonary arteries to
suggest acute pulmonary embolism.

Little contrast in the aorta today. Mild Calcified aortic
atherosclerosis.

Cardiomegaly with extensive calcified coronary artery
atherosclerosis and/or stents (series 6, image 196). No pericardial
effusion.

Mediastinum/Nodes: Stable small mediastinal lymph nodes, no
lymphadenopathy.

Lungs/Pleura: Small to moderate layering and sub pulmonic left
pleural effusion with simple fluid density favoring a transudate.
Volume of fluid is increased compared to that in [REDACTED]. There is
associated enhancing left lower lobe atelectasis.

Right side pleural effusion has regressed since [REDACTED] with
residual pleural thickening in the costophrenic angle and trace
fluid (series 5, image 107). Dependent and peribronchial right lower
lobe atelectasis which is enhancing. Superimposed mild gas trapping.

Upper Abdomen: Negative visible liver, spleen, stomach.

Musculoskeletal: No acute osseous abnormality identified. T6 benign
vertebral body hemangioma again noted. No acute or suspicious
osseous lesion.

Review of the MIP images confirms the above findings.
IMPRESSION: 1. Negative for acute pulmonary embolus.
2. Small to moderate layering and sub-pulmonic left pleural effusion
has increased since [REDACTED]. Favor transudate. Associated left
lung atelectasis.
Regressed right pleural effusion since [REDACTED] with residual
pleural thickening and trace fluid. And right lower lobe which
opacity more resembles atelectasis than infection.
3. Cardiomegaly.  Calcified coronary artery atherosclerosis.

## 2019-11-06 MED ORDER — SODIUM CHLORIDE 0.9 % IV SOLN
1.0000 g | Freq: Once | INTRAVENOUS | Status: AC
  Administered 2019-11-06: 06:00:00 1 g via INTRAVENOUS
  Filled 2019-11-06: qty 1

## 2019-11-06 MED ORDER — AMLODIPINE BESYLATE 5 MG PO TABS
5.0000 mg | ORAL_TABLET | Freq: Every day | ORAL | Status: DC
  Administered 2019-11-06 – 2019-11-09 (×4): 5 mg via ORAL
  Filled 2019-11-06 (×4): qty 1

## 2019-11-06 MED ORDER — MELATONIN 5 MG PO TABS
5.0000 mg | ORAL_TABLET | Freq: Every evening | ORAL | Status: DC | PRN
  Administered 2019-11-06 – 2019-11-09 (×4): 5 mg via ORAL
  Filled 2019-11-06 (×7): qty 1

## 2019-11-06 MED ORDER — INSULIN ASPART 100 UNIT/ML ~~LOC~~ SOLN
0.0000 [IU] | Freq: Three times a day (TID) | SUBCUTANEOUS | Status: DC
  Administered 2019-11-06: 2 [IU] via SUBCUTANEOUS
  Administered 2019-11-07 – 2019-11-10 (×3): 1 [IU] via SUBCUTANEOUS

## 2019-11-06 MED ORDER — OXYCODONE HCL 5 MG PO TABS
5.0000 mg | ORAL_TABLET | Freq: Once | ORAL | Status: AC
  Administered 2019-11-06: 02:00:00 5 mg via ORAL
  Filled 2019-11-06: qty 1

## 2019-11-06 MED ORDER — ALBUTEROL SULFATE (2.5 MG/3ML) 0.083% IN NEBU: 2.5000 mg | INHALATION_SOLUTION | Freq: Four times a day (QID) | RESPIRATORY_TRACT | Status: DC | PRN

## 2019-11-06 MED ORDER — OXYCODONE HCL 5 MG PO TABS
5.0000 mg | ORAL_TABLET | Freq: Two times a day (BID) | ORAL | Status: DC | PRN
  Administered 2019-11-06 – 2019-11-09 (×7): 5 mg via ORAL
  Filled 2019-11-06 (×7): qty 1

## 2019-11-06 MED ORDER — IOHEXOL 350 MG/ML SOLN: 100.0000 mL | Freq: Once | INTRAVENOUS | Status: DC | PRN

## 2019-11-06 MED ORDER — SUCROFERRIC OXYHYDROXIDE 500 MG PO CHEW
1000.0000 mg | CHEWABLE_TABLET | Freq: Three times a day (TID) | ORAL | Status: DC
  Administered 2019-11-06 – 2019-11-10 (×9): 1000 mg via ORAL
  Filled 2019-11-06 (×12): qty 2

## 2019-11-06 MED ORDER — VANCOMYCIN HCL 2000 MG/400ML IV SOLN
2000.0000 mg | INTRAVENOUS | Status: AC
  Administered 2019-11-06: 2000 mg via INTRAVENOUS
  Filled 2019-11-06: qty 400

## 2019-11-06 MED ORDER — VANCOMYCIN HCL IN DEXTROSE 1-5 GM/200ML-% IV SOLN
1000.0000 mg | INTRAVENOUS | Status: DC
  Administered 2019-11-08: 1000 mg via INTRAVENOUS
  Filled 2019-11-06: qty 200

## 2019-11-06 MED ORDER — IOHEXOL 350 MG/ML SOLN
100.0000 mL | Freq: Once | INTRAVENOUS | Status: AC | PRN
  Administered 2019-11-06: 100 mL via INTRAVENOUS

## 2019-11-06 MED ORDER — SODIUM CHLORIDE 0.9 % IV SOLN: 2.0000 g | INTRAVENOUS | Status: DC

## 2019-11-06 MED ORDER — TAMSULOSIN HCL 0.4 MG PO CAPS
0.4000 mg | ORAL_CAPSULE | Freq: Every day | ORAL | Status: DC
  Administered 2019-11-06 – 2019-11-09 (×4): 0.4 mg via ORAL
  Filled 2019-11-06 (×4): qty 1

## 2019-11-06 MED ORDER — IPRATROPIUM-ALBUTEROL 0.5-2.5 (3) MG/3ML IN SOLN: 3.0000 mL | Freq: Four times a day (QID) | RESPIRATORY_TRACT | Status: DC | PRN

## 2019-11-06 MED ORDER — ONDANSETRON HCL 4 MG PO TABS: 4.0000 mg | ORAL_TABLET | Freq: Four times a day (QID) | ORAL | Status: DC | PRN

## 2019-11-06 MED ORDER — ONDANSETRON HCL 4 MG/2ML IJ SOLN: 4.0000 mg | Freq: Four times a day (QID) | INTRAMUSCULAR | Status: DC | PRN

## 2019-11-06 MED ORDER — PHENOL 1.4 % MT LIQD
1.0000 | OROMUCOSAL | Status: DC | PRN
  Filled 2019-11-06: qty 177

## 2019-11-06 MED ORDER — CHLORHEXIDINE GLUCONATE CLOTH 2 % EX PADS
6.0000 | MEDICATED_PAD | Freq: Every day | CUTANEOUS | Status: DC
  Administered 2019-11-06 – 2019-11-07 (×2): 6 via TOPICAL

## 2019-11-06 MED ORDER — MIDODRINE HCL 5 MG PO TABS
10.0000 mg | ORAL_TABLET | Freq: Every day | ORAL | Status: DC
  Administered 2019-11-06 – 2019-11-07 (×2): 10 mg via ORAL
  Filled 2019-11-06 (×3): qty 2

## 2019-11-06 MED ORDER — ATORVASTATIN CALCIUM 40 MG PO TABS
40.0000 mg | ORAL_TABLET | Freq: Every day | ORAL | Status: DC
  Administered 2019-11-06 – 2019-11-09 (×4): 40 mg via ORAL
  Filled 2019-11-06 (×4): qty 1

## 2019-11-06 MED ORDER — TRAMADOL HCL 50 MG PO TABS
50.0000 mg | ORAL_TABLET | Freq: Three times a day (TID) | ORAL | Status: DC | PRN
  Administered 2019-11-06 – 2019-11-10 (×5): 50 mg via ORAL
  Filled 2019-11-06 (×5): qty 1

## 2019-11-06 MED ORDER — PANTOPRAZOLE SODIUM 40 MG PO TBEC
40.0000 mg | DELAYED_RELEASE_TABLET | Freq: Every day | ORAL | Status: DC
  Administered 2019-11-06 – 2019-11-09 (×4): 40 mg via ORAL
  Filled 2019-11-06 (×4): qty 1

## 2019-11-06 MED ORDER — BUPROPION HCL ER (XL) 300 MG PO TB24
300.0000 mg | ORAL_TABLET | Freq: Every day | ORAL | Status: DC
  Administered 2019-11-06 – 2019-11-09 (×4): 300 mg via ORAL
  Filled 2019-11-06 (×4): qty 1

## 2019-11-06 NOTE — H&P (Signed)
History and Physical    Michael Riggs ZOX:[id number] DOB: [birth year] DOA: 11/05/2019  PCP: [name], MD  Patient coming from: Home.  Chief Complaint: Chills and rigors.  HPI: Michael Riggs is a 49 y.o. 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  Patient states he did well after he recently discharged after being treated for pneumonia.  He took his oral antibiotics.  He got his COVID-19 second dose vaccination around 10 days ago following which he started having chills and rigors again.  Has been some nonproductive cough occasionally short of breath at night.  Since his symptoms been ongoing he presents to the ER.  Denies chest pain.  ED Course: In the ER CT angiogram of the chest was done which was negative for pulmonary embolism but does show worsening left pleural effusion and the right pleural effusion is improved with some pleural thickening and possible atelectasis versus infiltrate on the right side.  Covid test was negative.  Labs show hemoglobin 11.7 platelets 230 creatinine 7.9 potassium 4 high sensitive troponin was 9 and 10.  EKG shows normal sinus rhythm with prolonged QTC.  Review of Systems: As per HPI, rest all negative.   Past Medical History:  Diagnosis Date  . Anemia   . Anemia of chronic disease   . Anxiety   . ARF (acute renal failure) ([place]) 01/30/2019  . Calciphylaxis   . Controlled type 2 diabetes mellitus with hyperglycemia, without long-term current use of insulin ([place])   . Debility 02/09/2019  . Depression   . Diabetes mellitus type 2 in obese ([place]) 07/04/2013  . Diabetes mellitus with peripheral vascular disease (HCC)    type 2 no meds, lost 100 lbs  . Dyslipidemia   . Dyspnea    inhaler  . End stage renal disease ([place])   .  ESRD (end stage renal disease) on dialysis ([place]) 01/2019   MWFS  . ESRD on dialysis ([place])   . Essential hypertension   . Fluid overload 02/04/2019  . GERD (gastroesophageal reflux disease)   . Goals of care, counseling/discussion   . Habitual alcohol use 07/04/2013  . Headache   . Hyperkalemia 01/30/2019  . Hypertension   . Hypoglycemia 01/30/2019  . Hypokalemia 01/30/2019  . Labile blood pressure   . Leukocytosis   . Lower GI bleed 05/14/2019  . Macrocytic anemia 01/30/2019  . Obesity, Class II, BMI 35-39.9, with comorbidity 07/04/2013  . Palliative care encounter   . PDR (proliferative diabetic retinopathy) ([place]) 12/14/2013  . Physical deconditioning   . Pressure injury of skin 04/27/2019  . Pruritus   . Scrotal edema   . Scrotal pain   . Sleep apnea    uses CPAP  . Sleep disturbance   . Slow transit constipation   . Status post below-knee amputation of left lower extremity ([place])   . Stroke [facility])    mini -shown on CT scan  . Syphilis 01/2019  . Urinary retention   . Venous hypertension 09/22/2013  . Wound infection 03/28/2019    Past Surgical History:  Procedure Laterality Date  . AV FISTULA PLACEMENT Left 01/09/2019   Procedure: ARTERIOVENOUS (AV) FISTULA CREATION LEFT UPPER ARM;  Surgeon: [name], MD;  Location: [facility];  Service: Vascular;  Laterality: Left;  . BASCILIC VEIN TRANSPOSITION Left  07/06/2019   Procedure: BASILIC VEIN TRANSPOSITION SECOND STAGE LEFT ARM;  Surgeon: [name], MD;  [facility];  Service: Vascular;  Laterality: Left;  . below the knee amputation Left   . EYE SURGERY Bilateral   . FLEXIBLE SIGMOIDOSCOPY N/A 05/15/2019   Procedure: FLEXIBLE SIGMOIDOSCOPY;  Surgeon: Rush Landmark Telford Nab., MD;  Location: [place];  Service: Gastroenterology;  Laterality: N/A;  . HEMOSTASIS CLIP PLACEMENT  05/15/2019   Procedure: HEMOSTASIS CLIP PLACEMENT;  Surgeon: Irving Copas., MD;  Location: [place];  Service: Gastroenterology;;  .  HOT HEMOSTASIS N/A 05/15/2019   Procedure: HOT HEMOSTASIS (ARGON PLASMA COAGULATION/BICAP);  Surgeon: Irving Copas., MD;  Location: [place];  Service: Gastroenterology;  Laterality: N/A;  . IR FLUORO GUIDE CV LINE RIGHT  01/31/2019  . IR FLUORO GUIDE CV LINE RIGHT  02/03/2019  . IR US GUIDE VASC ACCESS RIGHT  01/31/2019  . IR US GUIDE VASC ACCESS RIGHT  02/03/2019  . SCLEROTHERAPY  05/15/2019   Procedure: SCLEROTHERAPY;  Surgeon: Mansouraty, Telford Nab., MD;  Location: [place];  Service: Gastroenterology;;     reports that he quit smoking about 9 months ago. His smoking use included cigarettes. He started smoking about a year ago. He has never used smokeless tobacco. He reports previous alcohol use. He reports previous drug use.  No Known Allergies  Family History  Problem Relation Age of Onset  . Diabetes Mother   . Multiple myeloma Mother     Prior to Admission medications   Medication Sig Start Date End Date Taking? Authorizing Provider  acetaminophen (TYLENOL) 325 MG tablet Take 2 tablets (650 mg total) by mouth every 6 (six) hours as needed for mild pain (or Fever >/= 101). 03/13/19  Yes Angiulli, Lavon Paganini, PA-C  albuterol (VENTOLIN HFA) 108 (90 Base) MCG/ACT inhaler Inhale 2 puffs into the lungs every 6 (six) hours as needed for wheezing or shortness of breath.   Yes [provider]  amLODipine (NORVASC) 5 MG tablet Take 5 mg by mouth at bedtime.    Yes [provider]  atorvastatin (LIPITOR) 40 MG tablet Take 1 tablet (40 mg total) by mouth daily. Patient taking differently: Take 40 mg by mouth at bedtime.  03/13/19  Yes Angiulli, Lavon Paganini, PA-C  buPROPion (WELLBUTRIN XL) 300 MG 24 hr tablet Take 300 mg by mouth at bedtime.  09/27/19  Yes [provider]  collagenase (SANTYL) ointment Apply 1 application topically daily. Apply to the affected area daily plus dry dressing 07/18/19  Yes Dondra Prader R, NP  diclofenac Sodium (VOLTAREN) 1 % GEL  Apply 2 g topically 4 (four) times daily. 10/12/19  Yes Geradine Girt, DO  Hydrocortisone (GERHARDT'S BUTT CREAM) CREA Apply 1 application topically 3 (three) times daily as needed for irritation. 04/27/19  Yes Dessa Phi, DO  ipratropium-albuterol (DUONEB) 0.5-2.5 (3) MG/3ML SOLN Take 3 mLs by nebulization every 6 (six) hours as needed (For shortness of breath).   Yes [provider]  Melatonin 5 MG TABS Take 5 mg by mouth at bedtime as needed (sleep).   Yes [provider]  midodrine (PROAMATINE) 10 MG tablet Take 10 mg by mouth at bedtime.    Yes [provider]  oxyCODONE (OXY IR/ROXICODONE) 5 MG immediate release tablet Take 5 mg by mouth 2 (two) times daily as needed for moderate pain.  10/19/19  Yes [provider]  pantoprazole (PROTONIX) 40 MG tablet Take 1 tablet (40 mg total) by mouth daily. Patient  taking differently: Take 40 mg by mouth at bedtime.  03/13/19  Yes Angiulli, Lavon Paganini, PA-C  sucroferric oxyhydroxide (VELPHORO) 500 MG chewable tablet Chew 1,000 mg by mouth 3 (three) times daily with meals.   Yes [provider]  tamsulosin (FLOMAX) 0.4 MG CAPS capsule Take 1 capsule (0.4 mg total) by mouth daily after breakfast. Patient taking differently: Take 0.4 mg by mouth at bedtime.  03/13/19  Yes Angiulli, Lavon Paganini, PA-C  traMADol (ULTRAM) 50 MG tablet Take 1 tablet (50 mg total) by mouth every 8 (eight) hours as needed for moderate pain. 05/18/19  Yes Mercy Riding, MD  doxycycline (VIBRA-TABS) 100 MG tablet Take 1 tablet (100 mg total) by mouth every 12 (twelve) hours. Patient not taking: Reported on 11/06/2019 10/12/19   Geradine Girt, DO  oxycodone (OXY-IR) 5 MG capsule Take 1 capsule (5 mg total) by mouth every 6 (six) hours as needed. Patient not taking: Reported on 11/06/2019 07/06/19   Dagoberto Ligas, PA-C    Physical Exam: Constitutional: Moderately built and nourished. Vitals:   11/06/19 0330 11/06/19 0451 11/06/19 0500  11/06/19 0530  BP: (!) 154/93 (!) 161/92 (!) 165/91 (!) 158/92  Pulse: 80 79 79 78  Resp: (!) '24 17 16 13  ' Temp:      SpO2: 97% 98% 96% 97%  Weight:      Height:       Eyes: Anicteric no pallor. ENMT: No discharge from the ears eyes nose or mouth. Neck: No muscle.  No neck rigidity. Respiratory: No rhonchi or crepitations. Cardiovascular: S1-S2 heard. Abdomen: Soft nontender bowel sounds present. Musculoskeletal: Left BKA. Skin: Chronic skin changes. Neurologic: Alert awake oriented time place and person.  Moves all extremities. Psychiatric: Appears normal with normal affect.   Labs on Admission: I have personally reviewed following labs and imaging studies  CBC: Recent Labs  Lab 11/05/19 1926  WBC 6.1  HGB 11.7*  HCT 38.3*  MCV 95.3  PLT 284   Basic Metabolic Panel: Recent Labs  Lab 11/05/19 1926  NA 138  K 4.0  CL 96*  CO2 26  GLUCOSE 136*  BUN 35*  CREATININE 7.92*  CALCIUM 8.7*   GFR: Estimated Creatinine Clearance: 13.5 mL/min (A) (by C-G formula based on SCr of 7.92 mg/dL (H)). Liver Function Tests: No results for input(s): AST, ALT, ALKPHOS, BILITOT, PROT, ALBUMIN in the last 168 hours. No results for input(s): LIPASE, AMYLASE in the last 168 hours. No results for input(s): AMMONIA in the last 168 hours. Coagulation Profile: No results for input(s): INR, PROTIME in the last 168 hours. Cardiac Enzymes: No results for input(s): CKTOTAL, CKMB, CKMBINDEX, TROPONINI in the last 168 hours. BNP (last 3 results) No results for input(s): PROBNP in the last 8760 hours. HbA1C: No results for input(s): HGBA1C in the last 72 hours. CBG: No results for input(s): GLUCAP in the last 168 hours. Lipid Profile: No results for input(s): CHOL, HDL, LDLCALC, TRIG, CHOLHDL, LDLDIRECT in the last 72 hours. Thyroid Function Tests: No results for input(s): TSH, T4TOTAL, FREET4, T3FREE, THYROIDAB in the last 72 hours. Anemia Panel: No results for input(s): VITAMINB12,  FOLATE, FERRITIN, TIBC, IRON, RETICCTPCT in the last 72 hours. Urine analysis:    Component Value Date/Time   COLORURINE YELLOW 03/10/2019 1054   APPEARANCEUR TURBID (A) 03/10/2019 1054   LABSPEC 1.020 03/10/2019 1054   PHURINE 5.0 03/10/2019 1054   GLUCOSEU 50 (A) 03/10/2019 1054   HGBUR SMALL (A) 03/10/2019 1054   BILIRUBINUR NEGATIVE 03/10/2019  [place] 03/10/2019 1054   PROTEINUR >=300 (A) 03/10/2019 1054   NITRITE NEGATIVE 03/10/2019 1054   LEUKOCYTESUR LARGE (A) 03/10/2019 1054   Sepsis Labs: '@LABRCNTIP' (procalcitonin:4,lacticidven:4) ) Recent Results (from the past 240 hour(s))  Respiratory Panel by RT PCR (Flu A&B, Covid) - Nasopharyngeal Swab     Status: None   Collection Time: 11/06/19  1:23 AM   Specimen: Nasopharyngeal Swab  Result Value Ref Range Status   SARS Coronavirus 2 by RT PCR NEGATIVE NEGATIVE Final    Comment: (NOTE) SARS-CoV-2 target nucleic acids are NOT DETECTED. The SARS-CoV-2 RNA is generally detectable in upper respiratoy specimens during the acute phase of infection. The lowest concentration of SARS-CoV-2 viral copies this assay can detect is 131 copies/mL. A negative result does not preclude SARS-Cov-2 infection and should not be used as the sole basis for treatment or other patient management decisions. A negative result may occur with  improper specimen collection/handling, submission of specimen other than nasopharyngeal swab, presence of viral mutation(s) within the areas targeted by this assay, and inadequate number of viral copies (<131 copies/mL). A negative result must be combined with clinical observations, patient history, and epidemiological information. The expected result is Negative. Fact Sheet for Patients:  PinkCheek.be Fact Sheet for Healthcare Providers:  GravelBags.it This test is not yet ap proved or cleared by the [place] FDA and  has been  authorized for detection and/or diagnosis of SARS-CoV-2 by FDA under an Emergency Use Authorization (EUA). This EUA will remain  in effect (meaning this test can be used) for the duration of the COVID-19 declaration under Section 564(b)(1) of the Act, 21 U.S.C. section 360bbb-3(b)(1), unless the authorization is terminated or revoked sooner.    Influenza A by PCR NEGATIVE NEGATIVE Final   Influenza B by PCR NEGATIVE NEGATIVE Final    Comment: (NOTE) The Xpert Xpress SARS-CoV-2/FLU/RSV assay is intended as an aid in  the diagnosis of influenza from Nasopharyngeal swab specimens and  should not be used as a sole basis for treatment. Nasal washings and  aspirates are unacceptable for Xpert Xpress SARS-CoV-2/FLU/RSV  testing. Fact Sheet for Patients: PinkCheek.be Fact Sheet for Healthcare Providers: GravelBags.it This test is not yet approved or cleared by the [place] FDA and  has been authorized for detection and/or diagnosis of SARS-CoV-2 by  FDA under an Emergency Use Authorization (EUA). This EUA will remain  in effect (meaning this test can be used) for the duration of the  Covid-19 declaration under Section 564(b)(1) of the Act, 21  U.S.C. section 360bbb-3(b)(1), unless the authorization is  terminated or revoked. Performed at [facility], [place] [address] [place],  [address]      Radiological Exams on Admission: DG Chest 2 View  Result Date: 11/05/2019 CLINICAL DATA:  Pneumonia, shortness of breath EXAM: CHEST - 2 VIEW COMPARISON:  10/11/2019 FINDINGS: Small left pleural effusion. Left lower lung atelectasis or infiltrate again noted, similar to prior study. Right lung clear. Heart is borderline in size. IMPRESSION: Continued small left pleural effusion with left lower lung atelectasis or infiltrate/pneumonia. Electronically Signed   By: Rolm Baptise M.D.   On: 11/05/2019 20:13   CT Angio Chest PE W  and/or Wo Contrast  Result Date: 11/06/2019 CLINICAL DATA:  49 year old male with shortness of breath. EXAM: CT ANGIOGRAPHY CHEST WITH CONTRAST TECHNIQUE: Multidetector CT imaging of the chest was performed using the standard protocol during bolus administration of intravenous contrast. Multiplanar CT image reconstructions and MIPs were  obtained to evaluate the vascular anatomy. CONTRAST:  163m OMNIPAQUE IOHEXOL 350 MG/ML SOLN COMPARISON:  CTA chest 04/12/2019. Chest radiographs yesterday. FINDINGS: Cardiovascular: Good contrast bolus timing in the pulmonary arterial tree. Stable mild central pulmonary artery enlargement since September (series 6, image 153). Mild respiratory motion. No focal filling defect identified in the pulmonary arteries to suggest acute pulmonary embolism. Little contrast in the aorta today. Mild Calcified aortic atherosclerosis. Cardiomegaly with extensive calcified coronary artery atherosclerosis and/or stents (series 6, image 196). No pericardial effusion. Mediastinum/Nodes: Stable small mediastinal lymph nodes, no lymphadenopathy. Lungs/Pleura: Small to moderate layering and sub pulmonic left pleural effusion with simple fluid density favoring a transudate. Volume of fluid is increased compared to that in September. There is associated enhancing left lower lobe atelectasis. Right side pleural effusion has regressed since September with residual pleural thickening in the costophrenic angle and trace fluid (series 5, image 107). Dependent and peribronchial right lower lobe atelectasis which is enhancing. Superimposed mild gas trapping. Upper Abdomen: Negative visible liver, spleen, stomach. Musculoskeletal: No acute osseous abnormality identified. T6 benign vertebral body hemangioma again noted. No acute or suspicious osseous lesion. Review of the MIP images confirms the above findings. IMPRESSION: 1. Negative for acute pulmonary embolus. 2. Small to moderate layering and sub-pulmonic  left pleural effusion has increased since September. Favor transudate. Associated left lung atelectasis. Regressed right pleural effusion since September with residual pleural thickening and trace fluid. And right lower lobe which opacity more resembles atelectasis than infection. 3. Cardiomegaly.  Calcified coronary artery atherosclerosis. Electronically Signed   By: HGenevie AnnM.D.   On: 11/06/2019 04:41    EKG: Independently reviewed.  Normal sinus rhythm prolonged QTC.  Assessment/Plan Principal Problem:   Acute respiratory failure with hypoxia (HCC) Active Problems:   [place]   Essential hypertension   Anemia of chronic disease   [place] ([place]   Diabetes mellitus with peripheral vascular disease ([place]    1. Chills and rigors with shortness of breath concerning for pneumonia or other infectious etiology for which blood cultures have been drawn patient is on empiric antibiotics.  If patient's pleural effusion does not improve with dialysis may need thoracentesis though the CAT scan she is moving back transudate at this time. 2. ESRD on hemodialysis Monday Wednesday Friday.  Consult nephrology for dialysis. 3. Diabetes mellitus type 2 presently not on any medication.  Will closely follow CBGs with sliding scale coverage. 4. History of hypotension on midodrine. 5. History of calciphylaxis on oxycodone and tramadol. 6. Anemia likely from renal disease follow CBC. 7. Sleep apnea on CPAP.   DVT prophylaxis: SCDs for now.  If in case patient may require thoracentesis and holding of anticoagulation. Code Status: Full code. Family Communication: Discussed with patient. Disposition Plan: Home. Consults called: None. Admission status: Observation.   ARise PatienceMD [facility] Pager [phone]  If 7PM-7AM, please contact night-coverage [email or web address] Password [email or web address]  11/06/2019, 6:01 AM

## 2019-11-06 NOTE — ED Notes (Signed)
Lunch Tray Ordered @ 1111. 

## 2019-11-06 NOTE — Progress Notes (Signed)
Patient seen and examined this morning, admitted overnight, H&P reviewed and agree with the A&P.    49 yo M with ESRD on HD MWF, DM, calciphylaxis, came in to the hospital with URI symptoms and fever/chills, CT with left infiltrate and left pleural effusion. He wa splaced on broad spectrum antibiotics and was admitted to the hospital.  Patient also tells me that he has been liberal with his fluids due to the fact that he was having a lot of night sweats.  He received the second Covid vaccine about 10 days ago and that is when all his symptoms started.   Lobar pneumonia-CT scan shows a possible left lower lobe infiltrate, associated with a left-sided pleural effusion.  The pleural effusion looks slightly larger than in the past.  He has been having respiratory symptoms and this is consistent with pneumonia, continue broad-spectrum antibiotics for now.  Tailor on cultures.  If left-sided pleural effusion is persistent after dialysis will consider thoracentesis -He is Covid negative.  He was vaccinated with COVID-19 vaccine and last dose was about 10 days ago  End-stage renal disease-he is scheduled for dialysis today, Monday.  Nephrology consulted  Type 2 diabetes mellitus-most recent A1c 5.3, I wonder whether diabetes diagnosis is accurate as I do not see any A1c above 6.5.  He has been placed on sliding scale.  Chest pain-pleuritic, worse with coughing, no concerns for cardiac etiology.  High-sensitivity troponins negative x2  Chronic hypotension-continue midodrine  History of calciphylaxis-continue pain control  Anemia of chronic renal disease-hemoglobin stable, no bleeding.  OSA-continue CPAP   Dezirea Mccollister M. Cruzita Lederer, MD, PhD [facility]  Between 7 am - 7 pm I am available, please contact me via Amion or Securechat Between 7 pm - 7 am I am not available, please contact night coverage MD/APP via Amion

## 2019-11-06 NOTE — Progress Notes (Signed)
Pharmacy Antibiotic Note  Michael Riggs is a 49 y.o. male admitted on 11/05/2019 with pneumonia.  Pharmacy has been consulted for Vancomycin and Cefepime dosing.   Recent hospitalization - d/c 10/12/19 after being treated for CAP. Pt with ESRD - HD on MWF as o/p.  Plan: Cefepime 2gm qHD Vancomycin 2gm IV now then 1000 mg IV Q HD Will f/u HD schedule/tolerance, micro data, and pt's clinical condition Vanc levels prn   Height: 5\' 10"  (177.8 cm) Weight: 97 kg (213 lb 13.5 oz) IBW/kg (Calculated) : 73  Temp (24hrs), Avg:98.5 F (36.9 C), Min:98.5 F (36.9 C), Max:98.5 F (36.9 C)  Recent Labs  Lab 11/05/19 1926  WBC 6.1  CREATININE 7.92*    Estimated Creatinine Clearance: 13.5 mL/min (A) (by C-G formula based on SCr of 7.92 mg/dL (H)).    No Known Allergies  Antimicrobials this admission: 4/19 Vanc >>  4/19 Cefepime >>   Microbiology results: 4/19 BCx:   Thank you for allowing pharmacy to be a part of this patient's care.  Sherlon Handing, PharmD, BCPS Please see amion for complete clinical pharmacist phone list 11/06/2019 6:08 AM

## 2019-11-06 NOTE — Consult Note (Signed)
Renal Service Consult Note [facility]  Michael Riggs 11/06/2019 [name] Requesting Physician:  Dr Michael Riggs  Reason for Consult:  ESRD pt w/ chills and abnormal CXR HPI: The patient is a 49 y.o. year-old with hx of ESRD on HD, DM2, calciphylaxis bilat LE's, chronic hypotension on midodrine presented to ED for chills and sweats for last few days.  Was recently here for PNA and felt good when dc'd. Took his po abx.  Has 2nd COVID shot 10 days ago. + cough but not productive. No SOB or CP, no abd pain, no n/v/d.  In ED CTA chest showed no PE but did show worsening L effusion and R effusion.  COVID was neg.  Hb 11.7.  CXR was concerning for more infection in LLL. Pt was admitted and started on IV abx empricially. Asked to see for recurrent effusion and ESRD.   Pt seen in room, eating lunch, no distress.  States his calciphylaxis is "so much better" than when I last saw him.  Denies prod cough, no fevers, no CP.  No abd pain, n/v/d.     ROS  denies CP  no joint pain   no HA  no blurry vision  no rash  no diarrhea  no nausea/ vomiting  no dysuria  no difficulty voiding  no change in urine color    Past Medical History  Past Medical History:  Diagnosis Date  . Anemia   . Anemia of chronic disease   . Anxiety   . ARF (acute renal failure) ([place]) 01/30/2019  . Calciphylaxis   . Controlled type 2 diabetes mellitus with hyperglycemia, without long-term current use of insulin ([place])   . Debility 02/09/2019  . Depression   . Diabetes mellitus type 2 in obese ([place]) 07/04/2013  . Diabetes mellitus with peripheral vascular disease ([facility])    type 2 no meds, lost 100 lbs  . Dyslipidemia   . Dyspnea    inhaler  . End stage renal disease ([place])   . ESRD (end stage renal disease) on dialysis ([place]) 01/2019   MWFS  . ESRD on dialysis ([place])   . Essential hypertension   . Fluid overload 02/04/2019  . GERD (gastroesophageal reflux disease)   . Goals of care,  counseling/discussion   . Habitual alcohol use 07/04/2013  . Headache   . Hyperkalemia 01/30/2019  . Hypertension   . Hypoglycemia 01/30/2019  . Hypokalemia 01/30/2019  . Labile blood pressure   . Leukocytosis   . Lower GI bleed 05/14/2019  . Macrocytic anemia 01/30/2019  . Obesity, Class II, BMI 35-39.9, with comorbidity 07/04/2013  . Palliative care encounter   . PDR (proliferative diabetic retinopathy) ([place]) 12/14/2013  . Physical deconditioning   . Pressure injury of skin 04/27/2019  . Pruritus   . Scrotal edema   . Scrotal pain   . Sleep apnea    uses CPAP  . Sleep disturbance   . Slow transit constipation   . Status post below-knee amputation of left lower extremity ([place])   . Stroke [facility])    mini -shown on CT scan  . Syphilis 01/2019  . Urinary retention   . Venous hypertension 09/22/2013  . Wound infection 03/28/2019   Past Surgical History  Past Surgical History:  Procedure Laterality Date  . AV FISTULA PLACEMENT Left 01/09/2019   Procedure: ARTERIOVENOUS (AV) FISTULA CREATION LEFT UPPER ARM;  Surgeon: [name], MD;  Location: [place];  Service: Vascular;  Laterality: Left;  . BASCILIC  VEIN TRANSPOSITION Left 07/06/2019   Procedure: BASILIC VEIN TRANSPOSITION SECOND STAGE LEFT ARM;  Surgeon: [name], MD;  Location: [facility] OR;  Service: Vascular;  Laterality: Left;  . below the knee amputation Left   . EYE SURGERY Bilateral   . FLEXIBLE SIGMOIDOSCOPY N/A 05/15/2019   Procedure: FLEXIBLE SIGMOIDOSCOPY;  Surgeon: Rush Landmark Telford Nab., MD;  Location: [place];  Service: Gastroenterology;  Laterality: N/A;  . HEMOSTASIS CLIP PLACEMENT  05/15/2019   Procedure: HEMOSTASIS CLIP PLACEMENT;  Surgeon: Irving Copas., MD;  Location: [place];  Service: Gastroenterology;;  . HOT HEMOSTASIS N/A 05/15/2019   Procedure: HOT HEMOSTASIS (ARGON PLASMA COAGULATION/BICAP);  Surgeon: Irving Copas., MD;  Location: [place];  Service: Gastroenterology;   Laterality: N/A;  . IR FLUORO GUIDE CV LINE RIGHT  01/31/2019  . IR FLUORO GUIDE CV LINE RIGHT  02/03/2019  . IR US GUIDE VASC ACCESS RIGHT  01/31/2019  . IR US GUIDE VASC ACCESS RIGHT  02/03/2019  . SCLEROTHERAPY  05/15/2019   Procedure: Clide Deutscher;  Surgeon: Mansouraty, Telford Nab., MD;  Location: [facility] ENDOSCOPY;  Service: Gastroenterology;;   Family History  Family History  Problem Relation Age of Onset  . Diabetes Mother   . Multiple myeloma Mother    Social History  reports that he quit smoking about 9 months ago. His smoking use included cigarettes. He started smoking about a year ago. He has never used smokeless tobacco. He reports previous alcohol use. He reports previous drug use. Allergies No Known Allergies Home medications Prior to Admission medications   Medication Sig Start Date End Date Taking? Authorizing Provider  acetaminophen (TYLENOL) 325 MG tablet Take 2 tablets (650 mg total) by mouth every 6 (six) hours as needed for mild pain (or Fever >/= 101). 03/13/19  Yes Angiulli, Lavon Paganini, PA-C  albuterol (VENTOLIN HFA) 108 (90 Base) MCG/ACT inhaler Inhale 2 puffs into the lungs every 6 (six) hours as needed for wheezing or shortness of breath.   Yes [provider]  amLODipine (NORVASC) 5 MG tablet Take 5 mg by mouth at bedtime.    Yes [provider]  atorvastatin (LIPITOR) 40 MG tablet Take 1 tablet (40 mg total) by mouth daily. Patient taking differently: Take 40 mg by mouth at bedtime.  03/13/19  Yes Angiulli, Lavon Paganini, PA-C  buPROPion (WELLBUTRIN XL) 300 MG 24 hr tablet Take 300 mg by mouth at bedtime.  09/27/19  Yes [provider]  collagenase (SANTYL) ointment Apply 1 application topically daily. Apply to the affected area daily plus dry dressing 07/18/19  Yes Dondra Prader R, NP  diclofenac Sodium (VOLTAREN) 1 % GEL Apply 2 g topically 4 (four) times daily. 10/12/19  Yes Geradine Girt, DO  Hydrocortisone (GERHARDT'S BUTT CREAM) CREA Apply 1  application topically 3 (three) times daily as needed for irritation. 04/27/19  Yes Dessa Phi, DO  ipratropium-albuterol (DUONEB) 0.5-2.5 (3) MG/3ML SOLN Take 3 mLs by nebulization every 6 (six) hours as needed (For shortness of breath).   Yes [provider]  Melatonin 5 MG TABS Take 5 mg by mouth at bedtime as needed (sleep).   Yes [provider]  midodrine (PROAMATINE) 10 MG tablet Take 10 mg by mouth at bedtime.    Yes [provider]  oxyCODONE (OXY IR/ROXICODONE) 5 MG immediate release tablet Take 5 mg by mouth 2 (two) times daily as needed for moderate pain.  10/19/19  Yes [provider]  pantoprazole (PROTONIX) 40 MG tablet Take 1 tablet (  40 mg total) by mouth daily. Patient taking differently: Take 40 mg by mouth at bedtime.  03/13/19  Yes Angiulli, Lavon Paganini, PA-C  sucroferric oxyhydroxide (VELPHORO) 500 MG chewable tablet Chew 1,000 mg by mouth 3 (three) times daily with meals.   Yes [provider]  tamsulosin (FLOMAX) 0.4 MG CAPS capsule Take 1 capsule (0.4 mg total) by mouth daily after breakfast. Patient taking differently: Take 0.4 mg by mouth at bedtime.  03/13/19  Yes Angiulli, Lavon Paganini, PA-C  traMADol (ULTRAM) 50 MG tablet Take 1 tablet (50 mg total) by mouth every 8 (eight) hours as needed for moderate pain. 05/18/19  Yes Mercy Riding, MD  doxycycline (VIBRA-TABS) 100 MG tablet Take 1 tablet (100 mg total) by mouth every 12 (twelve) hours. Patient not taking: Reported on 11/06/2019 10/12/19   Geradine Girt, DO  oxycodone (OXY-IR) 5 MG capsule Take 1 capsule (5 mg total) by mouth every 6 (six) hours as needed. Patient not taking: Reported on 11/06/2019 07/06/19   Dagoberto Ligas, PA-C     Vitals:   11/06/19 6720 11/06/19 0657 11/06/19 0850 11/06/19 1114  BP: (!) 165/91 (!) 155/92 (!) 146/92 (!) 146/103  Pulse: 81 81 78 75  Resp: '13 17 17 16  ' Temp:      SpO2: 98% 99% 97% 100%  Weight:      Height:       Exam Gen alert,  sitting up eating in ED on stretcher No rash, cyanosis or gangrene Sclera anicteric, throat clear  No jvd or bruits Chest clear bilat to bases no rales or wheezing RRR no MRG Abd soft ntnd no mass or ascites +bs GU normal male MS no joint effusions or deformity Ext +L BKA, no LLE edema, old wounds have primarily healed over Neuro is alert, Ox 3 , nf    Home meds:  - norvasc 5/ midodrine 10 hs  - velphoro 1 gm tid ac  - lipitor 40/ flomax 0.4 qd/ PPI 40 qd  - wellbutrin xl 300 hs/ prn oxy IR qid/ prn tramadol 50 tid prn  - duoneb prn qid  - prn's/ vitamins/ supplements    Outpt HD: MWF    5h   400/800   97.5kg   2/2 bath  Hep none   - Na thio 25 gm tid   - venofer 50 mg /wk   Assessment/ Plan: 1. Chills/ SOB - r/o recur PNA, on empiric IV abx.  2. ESRD - on HD MWF. Labs okay, no vol overload. HD tonight, or possibly may be postponed d/t high pt census. 3. HTN/ vol - BP's okay, no gross vol overload, UF to dry wt 4. Calciphylaxis - very good recovery 5. L BKA 6. HL 7. Depression - cont meds 8.  Anemia ckd - Hb good, no esa fornow 9.  MBD ckd - Ca in range.  Cont binder, not on vdra or sensipar      Kelly Splinter  MD 11/06/2019, 3:03 PM  Recent Labs  Lab 11/05/19 1926  WBC 6.1  HGB 11.7*   Recent Labs  Lab 11/05/19 1926  K 4.0  BUN 35*  CREATININE 7.92*  CALCIUM 8.7*

## 2019-11-06 NOTE — ED Notes (Signed)
Dr. Cruzita Lederer paged to [phone] Benjamine Mola, RN paged by Levada Dy

## 2019-11-06 NOTE — Progress Notes (Signed)
Pt. HD tx moved to 11/07/2019. Primary nurse Glenford Bayley, RN made aware

## 2019-11-06 NOTE — ED Provider Notes (Signed)
[facility] EMERGENCY DEPARTMENT Provider Note   CSN: [id number] Arrival date & time: 11/05/19  1832     History Chief Complaint  Patient presents with  . Shortness of Breath    Michael Riggs is a 49 y.o. male.  The history is provided by the patient.  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  This is been ongoing for the past week.  He reports associated chills and diaphoresis.  No fever recorded.  He also reports chest pain that seems to be worse with movement or leaning forward.  No hemoptysis.  No pleuritic chest pain.  Patient was admitted last month for similar episode.  He Was diagnosed with pneumonia and completed antibiotics and felt improved.  His current symptoms began soon after receiving the second dose of his COVID-19 vaccine.  His current symptoms feel very similar to the admission last month    Past Medical History:  Diagnosis Date  . Anemia   . Anemia of chronic disease   . Anxiety   . ARF (acute renal failure) ([place]) 01/30/2019  . Calciphylaxis   . Controlled type 2 diabetes mellitus with hyperglycemia, without long-term current use of insulin ([place])   . Debility 02/09/2019  . Depression   . Diabetes mellitus type 2 in obese ([place]) 07/04/2013  . Diabetes mellitus with peripheral vascular disease ([facility])    type 2 no meds, lost 100 lbs  . Dyslipidemia   . Dyspnea    inhaler  . End stage renal disease ([place])   . ESRD (end stage renal disease) on dialysis ([place]) 01/2019   MWFS  .  ESRD on dialysis ([place])   . Essential hypertension   . Fluid overload 02/04/2019  . GERD (gastroesophageal reflux disease)   . Goals of care, counseling/discussion   . Habitual alcohol use 07/04/2013  . Headache   . Hyperkalemia 01/30/2019  . Hypertension   . Hypoglycemia 01/30/2019  . Hypokalemia 01/30/2019  . Labile blood pressure   . Leukocytosis   . Lower GI bleed 05/14/2019  . Macrocytic anemia 01/30/2019  . Obesity, Class II, BMI 35-39.9, with comorbidity 07/04/2013  . Palliative care encounter   . PDR (proliferative diabetic retinopathy) ([place]) 12/14/2013  . Physical deconditioning   . Pressure injury of skin 04/27/2019  . Pruritus   . Scrotal edema   . Scrotal pain   . Sleep apnea    uses CPAP  . Sleep disturbance   . Slow transit constipation   . Status post below-knee amputation of left lower extremity ([place])   . Stroke [facility])    mini -shown on CT scan  . Syphilis 01/2019  . Urinary retention   . Venous hypertension 09/22/2013  . Wound infection 03/28/2019    Patient Active Problem List   Diagnosis Date Noted  . CAP (community acquired pneumonia) 10/11/2019  . Lower GI bleed 05/14/2019  . Pressure injury of skin 04/27/2019  . Palliative care encounter   . Goals of care, counseling/discussion   . Calciphylaxis   . Wound infection 03/28/2019  . Hypertension   . Dyslipidemia   .  Diabetes mellitus with peripheral vascular disease ([place])   . Depression   . Physical deconditioning   . Pruritus   . Scrotal pain   . Labile blood pressure   . Scrotal edema   . Status post below-knee amputation of left lower extremity ([place])   . Slow transit constipation   . Leukocytosis   . Sleep disturbance   . Essential hypertension   . Anemia of chronic disease   . Controlled type 2 diabetes mellitus with hyperglycemia, without long-term current use of insulin ([place])   . ESRD on dialysis ([place])   . Urinary retention   . Debility 02/09/2019  . Fluid overload 02/04/2019  . Hyperkalemia  01/30/2019  . ARF (acute renal failure) ([place]) 01/30/2019  . Hypokalemia 01/30/2019  . Hypoglycemia 01/30/2019  . Syphilis 01/30/2019  . Macrocytic anemia 01/30/2019  . End stage renal disease ([place])   . ESRD (end stage renal disease) on dialysis ([place]) 01/2019  . PDR (proliferative diabetic retinopathy) ([place]) 12/14/2013  . Venous hypertension 09/22/2013  . Diabetes mellitus type 2 in obese ([place]) 07/04/2013  . Habitual alcohol use 07/04/2013  . Obesity, Class II, BMI 35-39.9, with comorbidity 07/04/2013    Past Surgical History:  Procedure Laterality Date  . AV FISTULA PLACEMENT Left 01/09/2019   Procedure: ARTERIOVENOUS (AV) FISTULA CREATION LEFT UPPER ARM;  Surgeon: [name], MD;  Location: [facility];  Service: Vascular;  Laterality: Left;  . BASCILIC VEIN TRANSPOSITION Left 07/06/2019   Procedure: BASILIC VEIN TRANSPOSITION SECOND STAGE LEFT ARM;  Surgeon: [name], MD;  Location: [facility];  Service: Vascular;  Laterality: Left;  . below the knee amputation Left   . EYE SURGERY Bilateral   . FLEXIBLE SIGMOIDOSCOPY N/A 05/15/2019   Procedure: FLEXIBLE SIGMOIDOSCOPY;  Surgeon: Rush Landmark Telford Nab., MD;  Location: [facility];  Service: Gastroenterology;  Laterality: N/A;  . HEMOSTASIS CLIP PLACEMENT  05/15/2019   Procedure: HEMOSTASIS CLIP PLACEMENT;  Surgeon: Irving Copas., MD;  Location: [facility];  Service: Gastroenterology;;  . HOT HEMOSTASIS N/A 05/15/2019   Procedure: HOT HEMOSTASIS (ARGON PLASMA COAGULATION/BICAP);  Surgeon: Irving Copas., MD;  Location: [facility];  Service: Gastroenterology;  Laterality: N/A;  . IR FLUORO GUIDE CV LINE RIGHT  01/31/2019  . IR FLUORO GUIDE CV LINE RIGHT  02/03/2019  . IR US GUIDE VASC ACCESS RIGHT  01/31/2019  . IR US GUIDE VASC ACCESS RIGHT  02/03/2019  . SCLEROTHERAPY  05/15/2019   Procedure: Clide Deutscher;  Surgeon: Mansouraty, Telford Nab., MD;  Location: [facility] ENDOSCOPY;  Service: Gastroenterology;;        Family History  Problem Relation Age of Onset  . Diabetes Mother   . Multiple myeloma Mother     Social History   Tobacco Use  . Smoking status: Former Smoker    Types: Cigarettes    Start date: 11/18/2018    Quit date: 01/18/2019    Years since quitting: 0.8  . Smokeless tobacco: Never Used  . Tobacco comment: pt stated got bored with it  Substance Use Topics  . Alcohol use: Not Currently    Comment: heavy drinker in the past, none since 07/19/18  . Drug use: Not Currently    Home Medications Prior to Admission medications   Medication Sig Start Date End Date Taking? Authorizing Provider  acetaminophen (TYLENOL) 325 MG tablet Take 2 tablets (650 mg total) by mouth every 6 (six) hours as needed for mild pain (or Fever >/= 101). 03/13/19   Angiulli, Lavon Paganini, PA-C  albuterol (VENTOLIN HFA) 108 (90 Base) MCG/ACT inhaler Inhale 2 puffs into the lungs every 6 (six) hours as needed for wheezing or shortness of breath.    [provider]  amLODipine (NORVASC) 5 MG tablet Take 5 mg by mouth daily.    [provider]  atorvastatin (LIPITOR) 40 MG tablet Take 1 tablet (40 mg total) by mouth daily. 03/13/19   Angiulli, Lavon Paganini, PA-C  buPROPion (WELLBUTRIN XL) 300 MG 24 hr tablet Take 300 mg by mouth daily. 09/27/19   [provider]  collagenase (SANTYL) ointment Apply 1 application topically daily. Apply to the affected area daily plus dry dressing 07/18/19   Dondra Prader R, NP  diclofenac Sodium (VOLTAREN) 1 % GEL Apply 2 g topically 4 (four) times daily. 10/12/19   Geradine Girt, DO  doxycycline (VIBRA-TABS) 100 MG tablet Take 1 tablet (100 mg total) by mouth every 12 (twelve) hours. 10/12/19   Geradine Girt, DO  Hydrocortisone (GERHARDT'S BUTT CREAM) CREA Apply 1 application topically 3 (three) times daily as needed for irritation. 04/27/19   Dessa Phi, DO  ipratropium-albuterol (DUONEB) 0.5-2.5 (3) MG/3ML SOLN Take 3 mLs by nebulization every 6 (six)  hours as needed (For shortness of breath).    [provider]  Melatonin 5 MG TABS Take 5 mg by mouth at bedtime as needed (sleep).    [provider]  midodrine (PROAMATINE) 10 MG tablet Take 10 mg by mouth daily.    [provider]  oxycodone (OXY-IR) 5 MG capsule Take 1 capsule (5 mg total) by mouth every 6 (six) hours as needed. Patient taking differently: Take 5 mg by mouth every 6 (six) hours as needed for pain.  07/06/19   Dagoberto Ligas, PA-C  pantoprazole (PROTONIX) 40 MG tablet Take 1 tablet (40 mg total) by mouth daily. 03/13/19   Angiulli, Lavon Paganini, PA-C  sucroferric oxyhydroxide (VELPHORO) 500 MG chewable tablet Chew 1,000 mg by mouth 3 (three) times daily with meals.    [provider]  tamsulosin (FLOMAX) 0.4 MG CAPS capsule Take 1 capsule (0.4 mg total) by mouth daily after breakfast. 03/13/19   Angiulli, Lavon Paganini, PA-C  traMADol (ULTRAM) 50 MG tablet Take 1 tablet (50 mg total) by mouth every 8 (eight) hours as needed for moderate pain. 05/18/19   Mercy Riding, MD    Allergies    Patient has no known allergies.  Review of Systems   Review of Systems  Constitutional: Positive for diaphoresis. Negative for fever.  Respiratory: Positive for cough and shortness of breath. Negative for hemoptysis.   Cardiovascular: Positive for chest pain.  Gastrointestinal: Negative for abdominal pain and vomiting.  All other systems reviewed and are negative.   Physical Exam Updated Vital Signs BP (!) 160/92   Pulse 82   Temp 98.5 F (36.9 C)   Resp 16   SpO2 95%   Physical Exam CONSTITUTIONAL: Chronically ill-appearing HEAD: Normocephalic/atraumatic EYES: EOMI/PERRL ENMT: Mucous membranes moist NECK: supple no meningeal signs SPINE/BACK:entire spine nontender CV: S1/S2 noted, no murmurs/rubs/gallops noted LUNGS: Lungs are clear to auscultation bilaterally, no apparent distress ABDOMEN: soft, nontender, no rebound or guarding, bowel sounds  noted throughout abdomen GU:no cva tenderness NEURO: Pt is awake/alert/appropriate, moves all extremitiesx4.  No facial droop.  EXTREMITIES: pulses normal/equal, left BKA noted stump is nontender.  Right lower extremity has healing lesions noted, no signs of cellulitis or abscess.  Dialysis access to left arm with thrill noted SKIN: warm, color normal PSYCH: no  abnormalities of mood noted, alert and oriented to situation  ED Results / Procedures / Treatments   Labs (all labs ordered are listed, but only abnormal results are displayed) Labs Reviewed  BASIC METABOLIC PANEL - Abnormal; Notable for the following components:      Result Value   Chloride 96 (*)    Glucose, Bld 136 (*)    BUN 35 (*)    Creatinine, Ser 7.92 (*)    Calcium 8.7 (*)    GFR calc non Af Amer 7 (*)    GFR calc Af Amer 8 (*)    Anion gap 16 (*)    All other components within normal limits  CBC - Abnormal; Notable for the following components:   RBC 4.02 (*)    Hemoglobin 11.7 (*)    HCT 38.3 (*)    RDW 16.3 (*)    All other components within normal limits  RESPIRATORY PANEL BY RT PCR (FLU A&B, COVID)  CULTURE, BLOOD (ROUTINE X 2)  CULTURE, BLOOD (ROUTINE X 2)  TROPONIN I (HIGH SENSITIVITY)  TROPONIN I (HIGH SENSITIVITY)    EKG EKG Interpretation  Date/Time:  Sunday November 05 2019 19:31:13 EDT Ventricular Rate:  83 PR Interval:  172 QRS Duration: 90 QT Interval:  432 QTC Calculation: 507 R Axis:   100 Text Interpretation: Normal sinus rhythm Rightward axis Prolonged QT Abnormal ECG When compared with ECG of 10/11/2019, QT has lengthened Confirmed by Delora Fuel ([id number]) on 11/05/2019 11:31:22 PM   Radiology DG Chest 2 View  Result Date: 11/05/2019 CLINICAL DATA:  Pneumonia, shortness of breath EXAM: CHEST - 2 VIEW COMPARISON:  10/11/2019 FINDINGS: Small left pleural effusion. Left lower lung atelectasis or infiltrate again noted, similar to prior study. Right lung clear. Heart is borderline in size.  IMPRESSION: Continued small left pleural effusion with left lower lung atelectasis or infiltrate/pneumonia. Electronically Signed   By: Rolm Baptise M.D.   On: 11/05/2019 20:13   CT Angio Chest PE W and/or Wo Contrast  Result Date: 11/06/2019 CLINICAL DATA:  49 year old male with shortness of breath. EXAM: CT ANGIOGRAPHY CHEST WITH CONTRAST TECHNIQUE: Multidetector CT imaging of the chest was performed using the standard protocol during bolus administration of intravenous contrast. Multiplanar CT image reconstructions and MIPs were obtained to evaluate the vascular anatomy. CONTRAST:  138m OMNIPAQUE IOHEXOL 350 MG/ML SOLN COMPARISON:  CTA chest 04/12/2019. Chest radiographs yesterday. FINDINGS: Cardiovascular: Good contrast bolus timing in the pulmonary arterial tree. Stable mild central pulmonary artery enlargement since September (series 6, image 153). Mild respiratory motion. No focal filling defect identified in the pulmonary arteries to suggest acute pulmonary embolism. Little contrast in the aorta today. Mild Calcified aortic atherosclerosis. Cardiomegaly with extensive calcified coronary artery atherosclerosis and/or stents (series 6, image 196). No pericardial effusion. Mediastinum/Nodes: Stable small mediastinal lymph nodes, no lymphadenopathy. Lungs/Pleura: Small to moderate layering and sub pulmonic left pleural effusion with simple fluid density favoring a transudate. Volume of fluid is increased compared to that in September. There is associated enhancing left lower lobe atelectasis. Right side pleural effusion has regressed since September with residual pleural thickening in the costophrenic angle and trace fluid (series 5, image 107). Dependent and peribronchial right lower lobe atelectasis which is enhancing. Superimposed mild gas trapping. Upper Abdomen: Negative visible liver, spleen, stomach. Musculoskeletal: No acute osseous abnormality identified. T6 benign vertebral body hemangioma again  noted. No acute or suspicious osseous lesion. Review of the MIP images confirms the above findings. IMPRESSION: 1. Negative for acute pulmonary  embolus. 2. Small to moderate layering and sub-pulmonic left pleural effusion has increased since September. Favor transudate. Associated left lung atelectasis. Regressed right pleural effusion since September with residual pleural thickening and trace fluid. And right lower lobe which opacity more resembles atelectasis than infection. 3. Cardiomegaly.  Calcified coronary artery atherosclerosis. Electronically Signed   By: Genevie Ann M.D.   On: 11/06/2019 04:41    Procedures Procedures    Medications Ordered in ED Medications  iohexol (OMNIPAQUE) 350 MG/ML injection 100 mL (has no administration in time range)  ceFEPIme (MAXIPIME) 1 g in sodium chloride 0.9 % 100 mL IVPB (1 g Intravenous New Bag/Given 11/06/19 0557)  vancomycin (VANCOREADY) IVPB 2000 mg/400 mL (has no administration in time range)  sodium chloride flush (NS) 0.9 % injection 3 mL (3 mLs Intravenous Given 11/06/19 0557)  oxyCODONE (Oxy IR/ROXICODONE) immediate release tablet 5 mg (5 mg Oral Given 11/06/19 0132)  iohexol (OMNIPAQUE) 350 MG/ML injection 100 mL (100 mLs Intravenous Contrast Given 11/06/19 0427)    ED Course  I have reviewed the triage vital signs and the nursing notes.  Pertinent labs & imaging results that were available during my care of the patient were reviewed by me and considered in my medical decision making (see chart for details).    MDM Rules/Calculators/A&P                      1:53 AM Patient with recurrent shortness of breath.  Patient noted last month for similar episode.  X-ray shows minimal change.  Patient is also reporting chest pain.  I feel that advanced imaging of his chest is warranted.  CT chest has been ordered.  Patient has been on dialysis greater than 6 months, and plans to have dialysis later today 5:59 AM Patient stable, no acute distress.   Patient with recurrent infiltrates as well as a pleural effusion.  Patient is symptomatic and reported short of breath.  Patient will be admitted, if effusion is large enough may need thoracentesis.  Discussed with Dr. Hal Hope with [facility].  Broad-spectrum antibiotics have been ordered Pt reports no missed HD sessions   This patient presents to the ED for concern of shortness of breath, this involves an extensive number of treatment options, and is a complaint that carries with it a high risk of complications and morbidity.  The differential diagnosis includes pneumonia, pulmonary embolism, acute coronary syndrome, CHF   Lab Tests:   I Ordered, reviewed, and interpreted labs, which included COVID-19 testing, complete blood count, metabolic panel  Medicines ordered:   I ordered medication oxycodone for pain  Imaging Studies ordered:   I ordered imaging studies which included chest x-ray and CT Chest  I independently visualized and interpreted imaging which showed recurrent pneumonia/pleural effusion  Additional history obtained:    Previous records obtained and reviewed   Consultations Obtained:   I consulted [facility] hospitalist and discussed lab and imaging findings  Reevaluation:  After the interventions stated above, I reevaluated the patient and found he is stable   Final Clinical Impression(s) / ED Diagnoses Final diagnoses:  ESRD (end stage renal disease) ([place])  Pleural effusion on left    Rx / DC Orders ED Discharge Orders    None       Ripley Fraise, MD 11/06/19 0559

## 2019-11-06 NOTE — ED Notes (Signed)
Spoke with service response, Dan Europe. She stated food tray was delayed d/t diet order being changed. Tray ordered again and will be sent shortly.

## 2019-11-06 NOTE — ED Notes (Signed)
Breakfast tray ordered 

## 2019-11-07 ENCOUNTER — Inpatient Hospital Stay (HOSPITAL_COMMUNITY): Payer: Medicare Other

## 2019-11-07 DIAGNOSIS — J90 Pleural effusion, not elsewhere classified: Secondary | ICD-10-CM

## 2019-11-07 DIAGNOSIS — D638 Anemia in other chronic diseases classified elsewhere: Secondary | ICD-10-CM

## 2019-11-07 DIAGNOSIS — E1151 Type 2 diabetes mellitus with diabetic peripheral angiopathy without gangrene: Secondary | ICD-10-CM

## 2019-11-07 LAB — CBC
HCT: 35.2 % — ABNORMAL LOW (ref 39.0–52.0)
Hemoglobin: 10.9 g/dL — ABNORMAL LOW (ref 13.0–17.0)
MCH: 29.2 pg (ref 26.0–34.0)
MCHC: 31 g/dL (ref 30.0–36.0)
MCV: 94.4 fL (ref 80.0–100.0)
Platelets: 218 10*3/uL (ref 150–400)
RBC: 3.73 MIL/uL — ABNORMAL LOW (ref 4.22–5.81)
RDW: 16.5 % — ABNORMAL HIGH (ref 11.5–15.5)
WBC: 7.4 10*3/uL (ref 4.0–10.5)
nRBC: 0 % (ref 0.0–0.2)

## 2019-11-07 LAB — COMPREHENSIVE METABOLIC PANEL
ALT: 28 U/L (ref 0–44)
AST: 24 U/L (ref 15–41)
Albumin: 3.1 g/dL — ABNORMAL LOW (ref 3.5–5.0)
Alkaline Phosphatase: 135 U/L — ABNORMAL HIGH (ref 38–126)
Anion gap: 18 — ABNORMAL HIGH (ref 5–15)
BUN: 50 mg/dL — ABNORMAL HIGH (ref 6–20)
CO2: 23 mmol/L (ref 22–32)
Calcium: 8.3 mg/dL — ABNORMAL LOW (ref 8.9–10.3)
Chloride: 97 mmol/L — ABNORMAL LOW (ref 98–111)
Creatinine, Ser: 9.95 mg/dL — ABNORMAL HIGH (ref 0.61–1.24)
GFR calc Af Amer: 6 mL/min — ABNORMAL LOW (ref >60)
GFR calc non Af Amer: 6 mL/min — ABNORMAL LOW (ref >60)
Glucose, Bld: 76 mg/dL (ref 70–99)
Potassium: 5.1 mmol/L (ref 3.5–5.1)
Sodium: 138 mmol/L (ref 135–145)
Total Bilirubin: 0.8 mg/dL (ref 0.3–1.2)
Total Protein: 6.9 g/dL (ref 6.5–8.1)

## 2019-11-07 LAB — GLUCOSE, CAPILLARY
Glucose-Capillary: 71 mg/dL (ref 70–99)
Glucose-Capillary: 119 mg/dL — ABNORMAL HIGH (ref 70–99)
Glucose-Capillary: 112 mg/dL — ABNORMAL HIGH (ref 70–99)
Glucose-Capillary: 140 mg/dL — ABNORMAL HIGH (ref 70–99)
Glucose-Capillary: 207 mg/dL — ABNORMAL HIGH (ref 70–99)
Glucose-Capillary: 142 mg/dL — ABNORMAL HIGH (ref 70–99)

## 2019-11-07 LAB — MRSA PCR SCREENING: MRSA by PCR: NEGATIVE

## 2019-11-07 IMAGING — DX DG CHEST 2V
3 series · 3 of 3 positions shown · non-contrast
Comparison: [DATE]

CLINICAL DATA: Shortness of breath.

EXAM:
CHEST - 2 VIEW

[chest lat (1 of 2)]
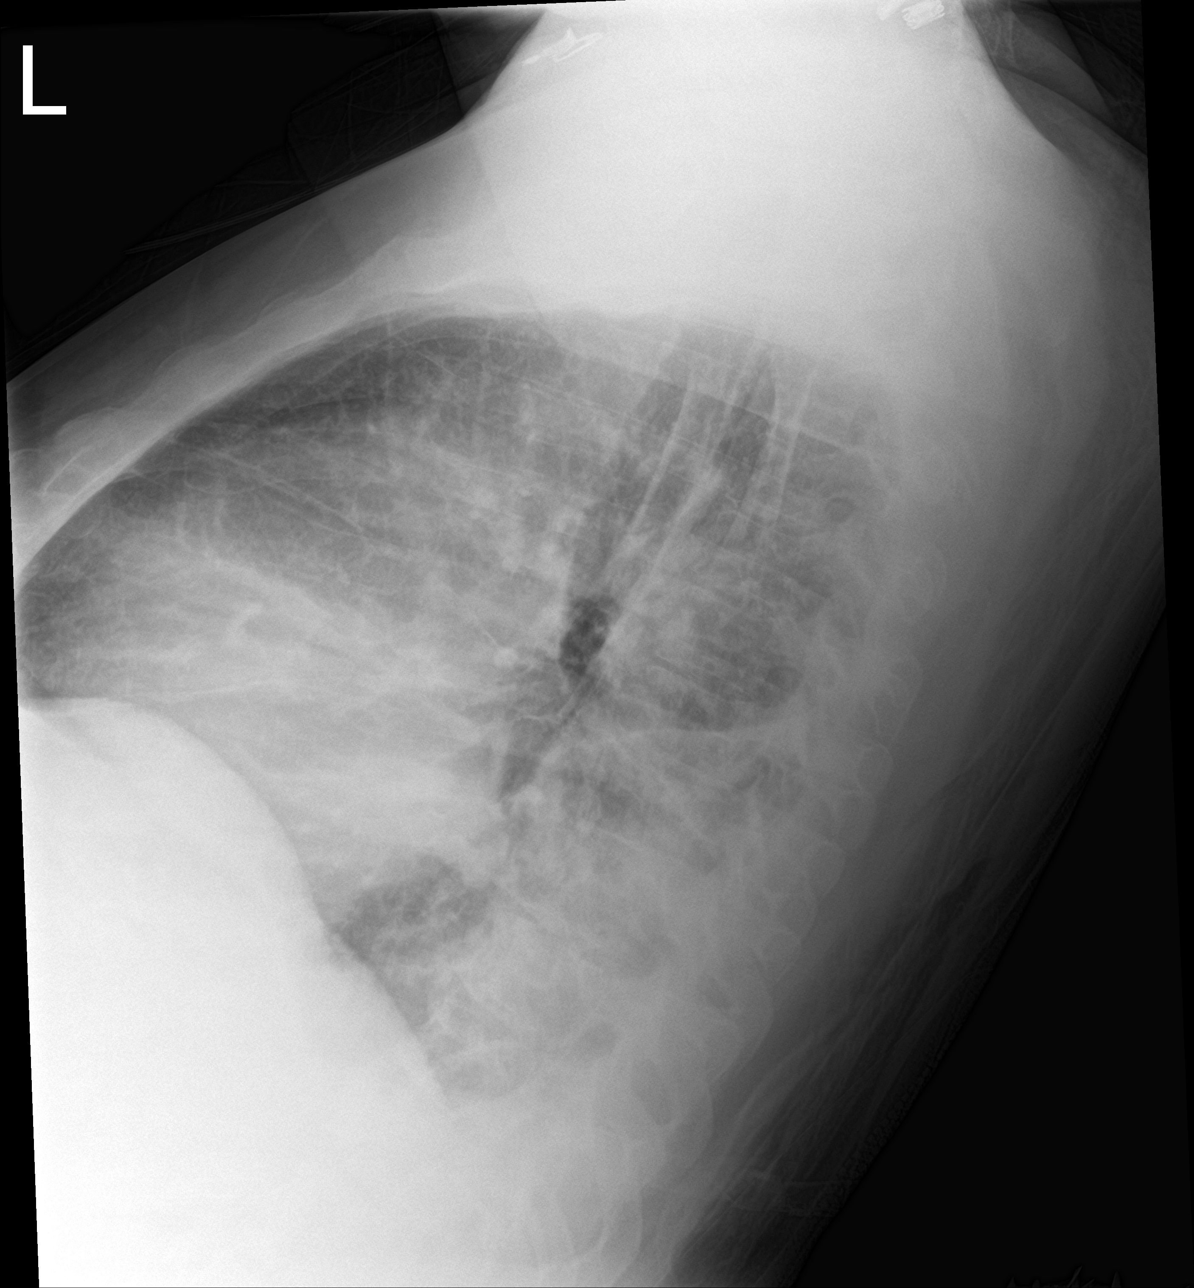

[chest ap]
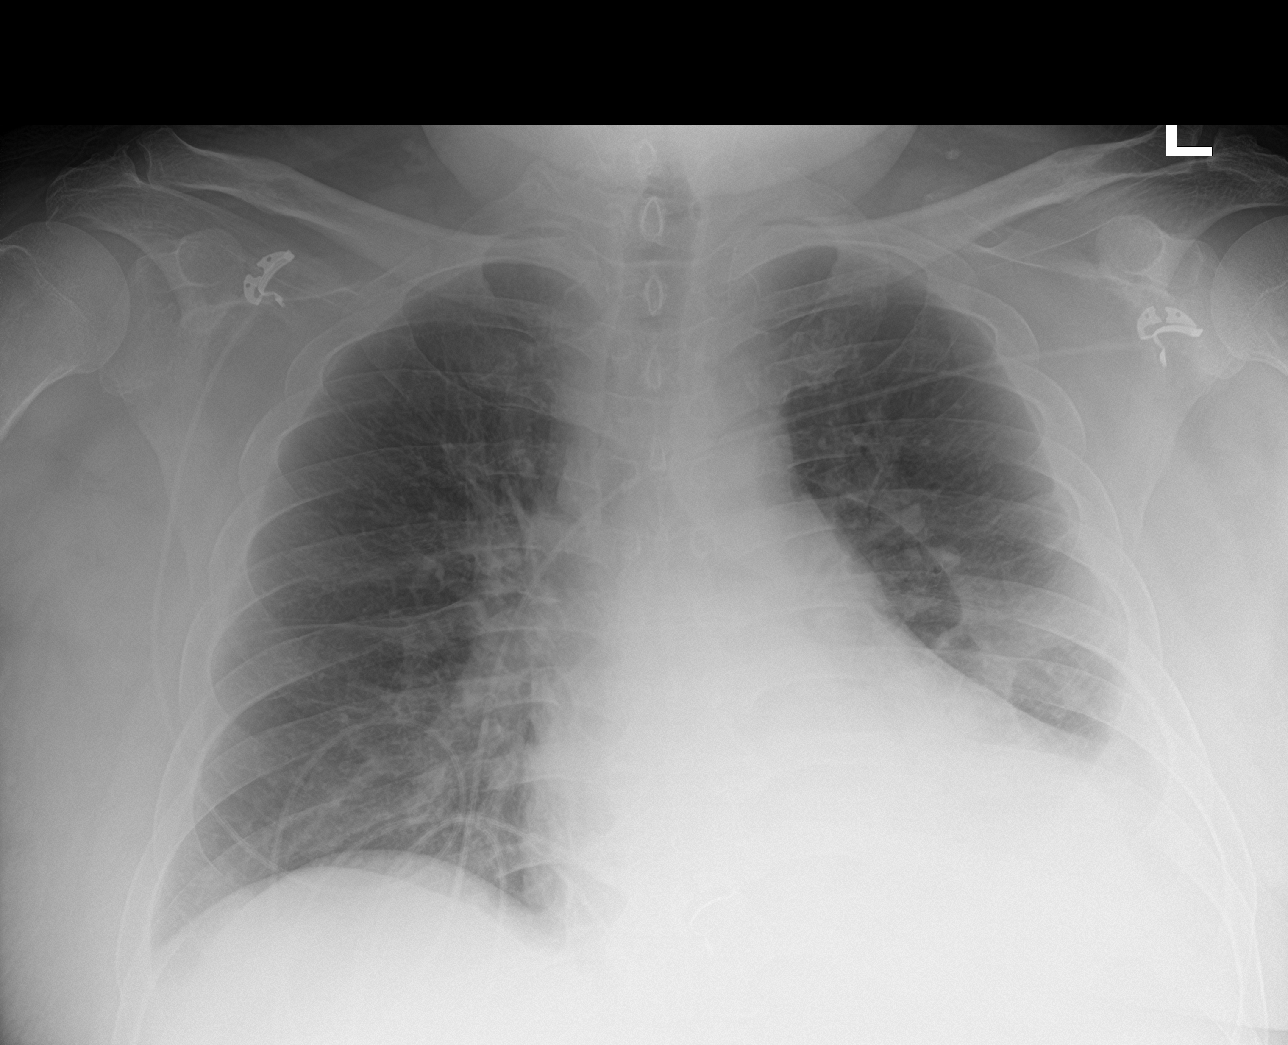

[chest lat (2 of 2)]
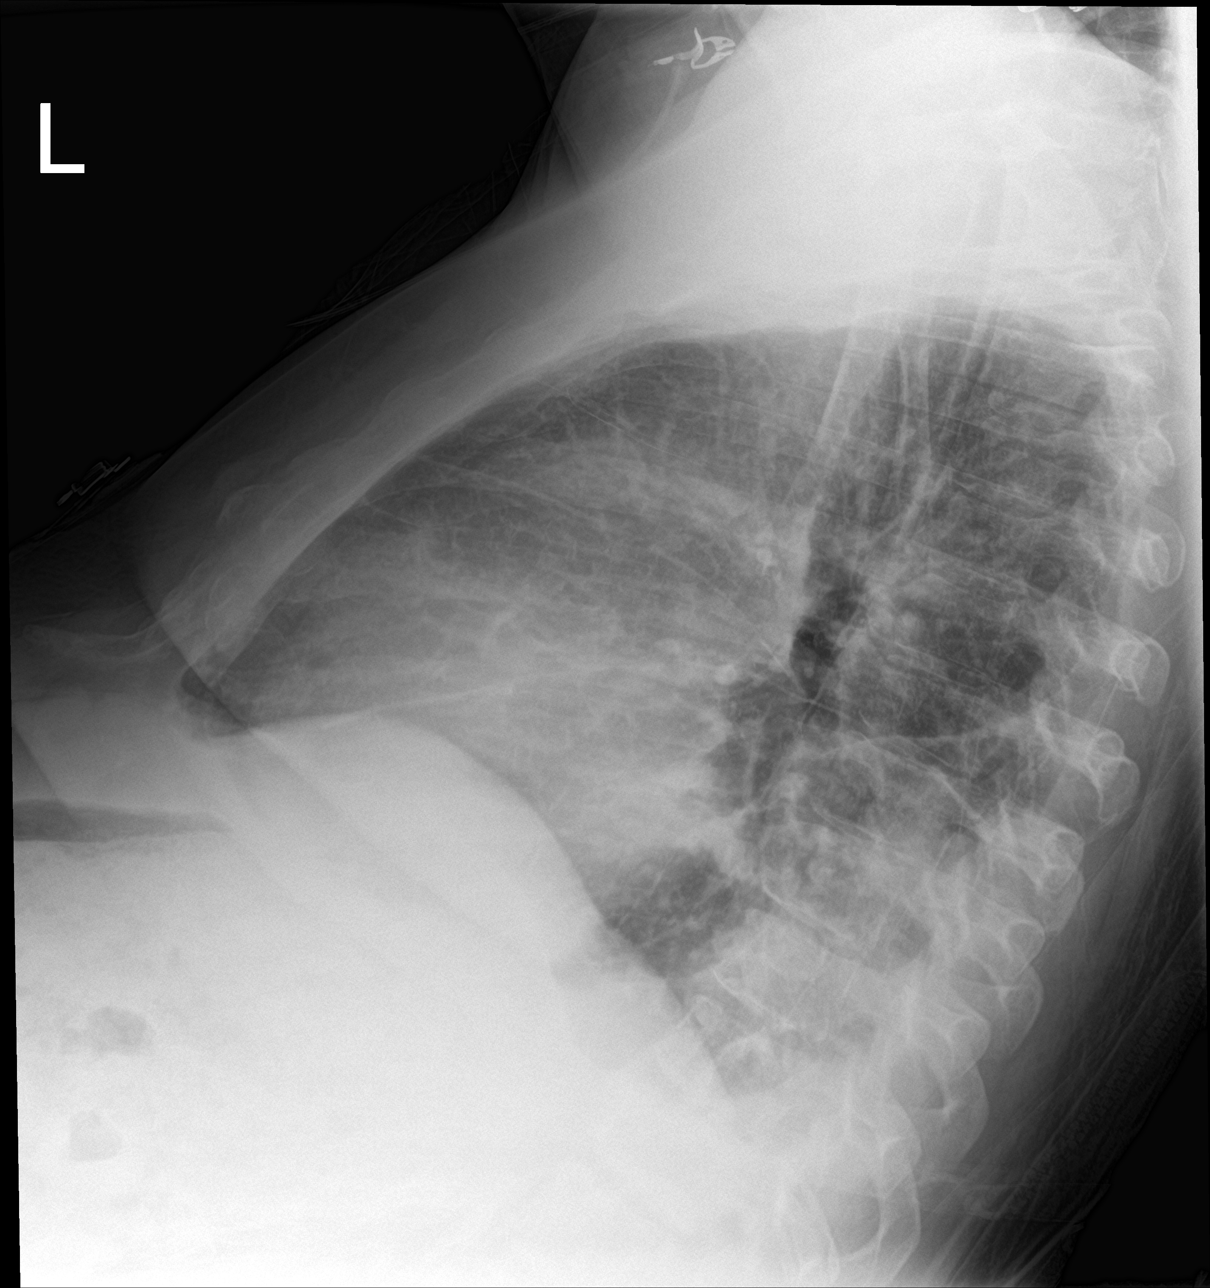

[3 of 3 positions shown; findings below may reference images not displayed]

FINDINGS: Stable left pleural effusion. Dense patchy opacity in the left lower
lobe, increased. Stable mildly prominent interstitial markings and
enlarged cardiac silhouette. Lower thoracic spine degenerative
changes.
IMPRESSION: 1. Increased dense left lower lobe pneumonia or atelectasis.
2. Stable left pleural effusion.
3. Stable cardiomegaly and mild chronic interstitial lung disease.

## 2019-11-07 MED ORDER — VANCOMYCIN HCL IN DEXTROSE 1-5 GM/200ML-% IV SOLN
INTRAVENOUS | Status: AC
  Administered 2019-11-07: 1000 mg via INTRAVENOUS
  Filled 2019-11-07: qty 200

## 2019-11-07 MED ORDER — SODIUM CHLORIDE 0.9 % IV SOLN
1.0000 g | Freq: Once | INTRAVENOUS | Status: AC
  Administered 2019-11-07: 1 g via INTRAVENOUS
  Filled 2019-11-07: qty 1

## 2019-11-07 MED ORDER — SODIUM CHLORIDE 0.9 % IV SOLN
2.0000 g | INTRAVENOUS | Status: DC
  Administered 2019-11-08: 2 g via INTRAVENOUS
  Filled 2019-11-07 (×2): qty 2

## 2019-11-07 MED ORDER — HEPARIN SODIUM (PORCINE) 1000 UNIT/ML IJ SOLN
INTRAMUSCULAR | Status: AC
  Administered 2019-11-07: 3000 [IU]
  Filled 2019-11-07: qty 3

## 2019-11-07 MED ORDER — LOPERAMIDE HCL 2 MG PO CAPS
2.0000 mg | ORAL_CAPSULE | ORAL | Status: DC | PRN
  Administered 2019-11-07: 2 mg via ORAL
  Filled 2019-11-07: qty 1

## 2019-11-07 MED ORDER — HYDROCORTISONE 1 % EX CREA
TOPICAL_CREAM | Freq: Two times a day (BID) | CUTANEOUS | Status: DC
  Filled 2019-11-07: qty 28

## 2019-11-07 MED ORDER — CHLORHEXIDINE GLUCONATE CLOTH 2 % EX PADS: 6.0000 | MEDICATED_PAD | Freq: Every day | CUTANEOUS | Status: DC

## 2019-11-07 NOTE — Plan of Care (Signed)

## 2019-11-07 NOTE — Progress Notes (Signed)
PROGRESS NOTE  Michael Riggs RCV:[id number] DOB: 01-Apr-1971 DOA: 11/05/2019 PCP: [name], MD   LOS: 1 day   Brief Narrative / Interim history: 50 yo M with ESRD on HD MWF, DM, calciphylaxis, came in to the hospital with URI symptoms and fever/chills, CT with left infiltrate and left pleural effusion. He wa splaced on broad spectrum antibiotics and was admitted to the hospital.  Patient also tells me that he has been liberal with his fluids due to the fact that he was having a lot of night sweats.  He received the second Covid vaccine about 10 days ago and that is when all his symptoms started.  Subjective / 24h Interval events: Complains of persistent symptoms overnight, has been having subjective chills and night sweats, tells me he is bed sheets had to be changed overnight.  Complains of a cough and persistent chest congestion.  He continues to have pleuritic type chest pain  Assessment & Plan: Principal Problem Lobar pneumonia-CT scan shows a possible left lower lobe infiltrate, associated with a left-sided pleural effusion.  The pleural effusion looks slightly larger than in the past.  He has been having respiratory symptoms and this is consistent with pneumonia, continue broad-spectrum antibiotics for now.  MRSA screen PCR pending. Tailor on cultures, so far without growth this morning. -He is Covid negative.  He was vaccinated with COVID-19 vaccine and last dose was about 10 days ago -Repeat a 2 view chest x-ray after dialysis to determine size of pleural effusion and whether a thoracentesis is indicated  Active Problems End-stage renal disease-he is scheduled for dialysis today, Monday.  Nephrology consulted, could not be dialyzed 4/19, he is scheduled for dialysis today 4/20  Questionable type 2 diabetes mellitus-most recent A1c 5.3, I wonder whether diabetes diagnosis is accurate as I do not see any A1c above 6.5.  He has been placed on sliding scale.  His CBGs readings  suggest he is not a diabetic  Chest pain-pleuritic, worse with coughing, no concerns for cardiac etiology. High-sensitivity troponins negative x2.   Chronic hypotension-continue midodrine, he has had few hyper readings, to get dialysis today, I also see that he is on amlodipine, defer to outpatient nephrology   History of calciphylaxis-continue pain control with home oxycodone and tramadol.  Lesions on bilateral lower extremities appear to be significantly better per patient  Left BKA -noted  Anemia of chronic renal disease-hemoglobin stable, no bleeding, 10.9 this morning  OSA-continue nightly CPAP  Hyperlipidemia-continue atorvastatin  GERD-continue PPI   Scheduled Meds: . amLODipine  5 mg Oral QHS  . atorvastatin  40 mg Oral QHS  . buPROPion  300 mg Oral QHS  . Chlorhexidine Gluconate Cloth  6 each Topical Q0600  . insulin aspart  0-9 Units Subcutaneous TID WC  . midodrine  10 mg Oral q1800  . pantoprazole  40 mg Oral QHS  . sucroferric oxyhydroxide  1,000 mg Oral TID WC  . tamsulosin  0.4 mg Oral QHS   Continuous Infusions: . [START ON 11/08/2019] ceFEPime (MAXIPIME) IV    . [START ON 11/08/2019] vancomycin     PRN Meds:.albuterol, ipratropium-albuterol, melatonin, oxyCODONE, phenol, traMADol  DVT prophylaxis: SCDs Code Status: Full code Family Communication: d/w patient  Patient admitted from: home Anticipated d/c place: home Barriers to d/c: Remains symptomatic with systemic symptoms, needing continuous IV antibiotics while monitoring cultures, repeat chest x-ray today postdialysis and potential thoracentesis  Consultants:  Nephrology  Procedures:  HD  Microbiology  Blood cultures 4/19-pending  Antimicrobials:  Vancomycin 4/19 >> Cefepime 4/19 >>   Objective: Vitals:   11/06/19 1630 11/06/19 1633 11/06/19 1900 11/06/19 2000  BP: (!) 152/92  (!) 172/88   Pulse:   96   Resp:  (!) 23 18 15   Temp:   98.3 F (36.8 C)   TempSrc:   Oral   SpO2:  99%  98%   Weight:      Height:        Intake/Output Summary (Last 24 hours) at 11/07/2019 0710 Last data filed at 11/06/2019 9417 Gross per 24 hour  Intake 400 ml  Output --  Net 400 ml   Filed Weights   11/06/19 0320  Weight: 97 kg    Examination:  Constitutional: NAD Eyes: no scleral icterus ENMT: Mucous membranes are moist.  Neck: normal, supple Respiratory: Diminished at the bases, antalgic breathing no wheezing, faint bibasilar rhonchi Cardiovascular: Regular rate and rhythm, no murmurs / rubs / gallops. No LE edema.  Abdomen: non distended, no tenderness. Bowel sounds positive.  Musculoskeletal: no clubbing / cyanosis.  Skin: Left BKA, multiple lesions bilateral lower extremities consistent with calciphylaxis, scarring nicely and appear to be healing Neurologic: Nonfocal Psychiatric: Normal judgment and insight. Alert and oriented x 3. Normal mood.    Data Reviewed: I have independently reviewed following labs and imaging studies   CBC: Recent Labs  Lab 11/05/19 1926 11/07/19 0148  WBC 6.1 7.4  HGB 11.7* 10.9*  HCT 38.3* 35.2*  MCV 95.3 94.4  PLT 230 408   Basic Metabolic Panel: Recent Labs  Lab 11/05/19 1926 11/07/19 0148  NA 138 138  K 4.0 5.1  CL 96* 97*  CO2 26 23  GLUCOSE 136* 76  BUN 35* 50*  CREATININE 7.92* 9.95*  CALCIUM 8.7* 8.3*   Liver Function Tests: Recent Labs  Lab 11/07/19 0148  AST 24  ALT 28  ALKPHOS 135*  BILITOT 0.8  PROT 6.9  ALBUMIN 3.1*   Coagulation Profile: No results for input(s): INR, PROTIME in the last 168 hours. HbA1C: No results for input(s): HGBA1C in the last 72 hours. CBG: Recent Labs  Lab 11/06/19 1146 11/06/19 1659 11/06/19 1924 11/06/19 2322 11/07/19 0506  GLUCAP 153* 103* 119* 88 71    Recent Results (from the past 240 hour(s))  Respiratory Panel by RT PCR (Flu A&B, Covid) - Nasopharyngeal Swab     Status: None   Collection Time: 11/06/19  1:23 AM   Specimen: Nasopharyngeal Swab  Result  Value Ref Range Status   SARS Coronavirus 2 by RT PCR NEGATIVE NEGATIVE Final    Comment: (NOTE) SARS-CoV-2 target nucleic acids are NOT DETECTED. The SARS-CoV-2 RNA is generally detectable in upper respiratoy specimens during the acute phase of infection. The lowest concentration of SARS-CoV-2 viral copies this assay can detect is 131 copies/mL. A negative result does not preclude SARS-Cov-2 infection and should not be used as the sole basis for treatment or other patient management decisions. A negative result may occur with  improper specimen collection/handling, submission of specimen other than nasopharyngeal swab, presence of viral mutation(s) within the areas targeted by this assay, and inadequate number of viral copies (<131 copies/mL). A negative result must be combined with clinical observations, patient history, and epidemiological information. The expected result is Negative. Fact Sheet for Patients:  PinkCheek.be Fact Sheet for Healthcare Providers:  GravelBags.it This test is not yet ap proved or cleared by the [place] FDA and  has been authorized for detection and/or diagnosis of SARS-CoV-2 by FDA  under an Emergency Use Authorization (EUA). This EUA will remain  in effect (meaning this test can be used) for the duration of the COVID-19 declaration under Section 564(b)(1) of the Act, 21 U.S.C. section 360bbb-3(b)(1), unless the authorization is terminated or revoked sooner.    Influenza A by PCR NEGATIVE NEGATIVE Final   Influenza B by PCR NEGATIVE NEGATIVE Final    Comment: (NOTE) The Xpert Xpress SARS-CoV-2/FLU/RSV assay is intended as an aid in  the diagnosis of influenza from Nasopharyngeal swab specimens and  should not be used as a sole basis for treatment. Nasal washings and  aspirates are unacceptable for Xpert Xpress SARS-CoV-2/FLU/RSV  testing. Fact Sheet for  Patients: PinkCheek.be Fact Sheet for Healthcare Providers: GravelBags.it This test is not yet approved or cleared by the [place] FDA and  has been authorized for detection and/or diagnosis of SARS-CoV-2 by  FDA under an Emergency Use Authorization (EUA). This EUA will remain  in effect (meaning this test can be used) for the duration of the  Covid-19 declaration under Section 564(b)(1) of the Act, 21  U.S.C. section 360bbb-3(b)(1), unless the authorization is  terminated or revoked. Performed at [facility], [place] [address], [name], [place] [address]   Blood culture (routine x 2)     Status: None (Preliminary result)   Collection Time: 11/06/19  5:31 AM   Specimen: BLOOD RIGHT FOREARM  Result Value Ref Range Status   Specimen Description BLOOD RIGHT FOREARM  Final   Special Requests   Final    BOTTLES DRAWN AEROBIC AND ANAEROBIC Blood Culture adequate volume   Culture PENDING  Incomplete   Report Status PENDING  Incomplete     Radiology Studies: No results found.  Marzetta Board, MD, PhD [facility]  Between 7 am - 7 pm I am available, please contact me via Amion or Securechat  Between 7 pm - 7 am I am not available, please contact night coverage MD/APP via Amion

## 2019-11-07 NOTE — Progress Notes (Signed)
Michael Riggs Progress Note  Subjective: seen on HD, withdrawn vs sleepy, no new c/o's  Vitals:   11/07/19 1100 11/07/19 1130 11/07/19 1200 11/07/19 1230  BP: 133/84 (!) 142/93 140/83 (!) 141/84  Pulse: 78 81 80 82  Resp: 17  14 12   Temp:      TempSrc:      SpO2:      Weight:      Height:        Exam: Gen alert, lying on bed doing HD No jvd or bruits Chest clear bilat  RRR no MRG Abd soft ntnd no mass or ascites +bs Ext +L BKA, RLE old wounds have healed over Neuro is alert, Ox 3 , nf    Home meds:  - norvasc 5/ midodrine 10 hs  - velphoro 1 gm tid ac  - lipitor 40/ flomax 0.4 qd/ PPI 40 qd  - wellbutrin xl 300 hs/ prn oxy IR qid/ prn tramadol 50 tid prn  - duoneb prn qid  - prn's/ vitamins/ supplements    Outpt HD: MWF    5h   400/800   97.5kg   2/2 bath  Hep none   - Na thio 25 gm tid   - venofer 50 mg /wk   Assessment/ Plan: 1. PNA - LLL by CT w/ effusion. On IV abx per pmd.   2. ESRD - on HD MWF. HD today off sched, crossover from yesterday.  3. HTN/ vol - BP's okay. No ^vol on exam 4. Calciphylaxis - very good recovery 5. L BKA 6. Depression - cont meds 7.  Anemia ckd - Hb good, no esa fornow 8.  MBD ckd - Ca in range.  Cont binder, not on vdra or sensipar    Michael Riggs 11/07/2019, 12:57 PM   Recent Labs  Lab 11/05/19 1926 11/07/19 0148  K 4.0 5.1  BUN 35* 50*  CREATININE 7.92* 9.95*  CALCIUM 8.7* 8.3*  HGB 11.7* 10.9*   Inpatient medications: . amLODipine  5 mg Oral QHS  . atorvastatin  40 mg Oral QHS  . buPROPion  300 mg Oral QHS  . Chlorhexidine Gluconate Cloth  6 each Topical Q0600  . heparin sodium (porcine)      . hydrocortisone cream   Topical BID  . insulin aspart  0-9 Units Subcutaneous TID WC  . midodrine  10 mg Oral q1800  . pantoprazole  40 mg Oral QHS  . sucroferric oxyhydroxide  1,000 mg Oral TID WC  . tamsulosin  0.4 mg Oral QHS   . ceFEPime (MAXIPIME) IV    . [START ON 11/08/2019] ceFEPime  (MAXIPIME) IV    . [START ON 11/08/2019] vancomycin Stopped (11/07/19 1143)   albuterol, ipratropium-albuterol, loperamide, melatonin, oxyCODONE, phenol, traMADol

## 2019-11-08 ENCOUNTER — Inpatient Hospital Stay (HOSPITAL_COMMUNITY): Payer: Medicare Other

## 2019-11-08 HISTORY — PX: IR THORACENTESIS ASP PLEURAL SPACE W/IMG GUIDE: IMG5380

## 2019-11-08 LAB — CBC
HCT: 35.1 % — ABNORMAL LOW (ref 39.0–52.0)
Hemoglobin: 10.9 g/dL — ABNORMAL LOW (ref 13.0–17.0)
MCH: 29 pg (ref 26.0–34.0)
MCHC: 31.1 g/dL (ref 30.0–36.0)
MCV: 93.4 fL (ref 80.0–100.0)
Platelets: 227 10*3/uL (ref 150–400)
RBC: 3.76 MIL/uL — ABNORMAL LOW (ref 4.22–5.81)
RDW: 16.6 % — ABNORMAL HIGH (ref 11.5–15.5)
WBC: 5.1 10*3/uL (ref 4.0–10.5)
nRBC: 0 % (ref 0.0–0.2)

## 2019-11-08 LAB — COMPREHENSIVE METABOLIC PANEL
ALT: 20 U/L (ref 0–44)
AST: 14 U/L — ABNORMAL LOW (ref 15–41)
Albumin: 2.9 g/dL — ABNORMAL LOW (ref 3.5–5.0)
Alkaline Phosphatase: 129 U/L — ABNORMAL HIGH (ref 38–126)
Anion gap: 14 (ref 5–15)
BUN: 31 mg/dL — ABNORMAL HIGH (ref 6–20)
CO2: 25 mmol/L (ref 22–32)
Calcium: 8.1 mg/dL — ABNORMAL LOW (ref 8.9–10.3)
Chloride: 96 mmol/L — ABNORMAL LOW (ref 98–111)
Creatinine, Ser: 7.23 mg/dL — ABNORMAL HIGH (ref 0.61–1.24)
GFR calc Af Amer: 9 mL/min — ABNORMAL LOW (ref >60)
GFR calc non Af Amer: 8 mL/min — ABNORMAL LOW (ref >60)
Glucose, Bld: 104 mg/dL — ABNORMAL HIGH (ref 70–99)
Potassium: 4 mmol/L (ref 3.5–5.1)
Sodium: 135 mmol/L (ref 135–145)
Total Bilirubin: 0.8 mg/dL (ref 0.3–1.2)
Total Protein: 6.8 g/dL (ref 6.5–8.1)

## 2019-11-08 LAB — GLUCOSE, CAPILLARY
Glucose-Capillary: 120 mg/dL — ABNORMAL HIGH (ref 70–99)
Glucose-Capillary: 78 mg/dL (ref 70–99)
Glucose-Capillary: 101 mg/dL — ABNORMAL HIGH (ref 70–99)
Glucose-Capillary: 110 mg/dL — ABNORMAL HIGH (ref 70–99)
Glucose-Capillary: 99 mg/dL (ref 70–99)

## 2019-11-08 LAB — PHOSPHORUS: Phosphorus: 4.3 mg/dL (ref 2.5–4.6)

## 2019-11-08 IMAGING — DX DG CHEST 1V
1 series · 1 of 1 positions shown · non-contrast
Comparison: Chest radiographs [DATE], CT [DATE] and
earlier.

CLINICAL DATA: 48-year-old male status post ultrasound-guided left
side thoracentesis this afternoon.

EXAM:
CHEST  1 VIEW

[chest ap]
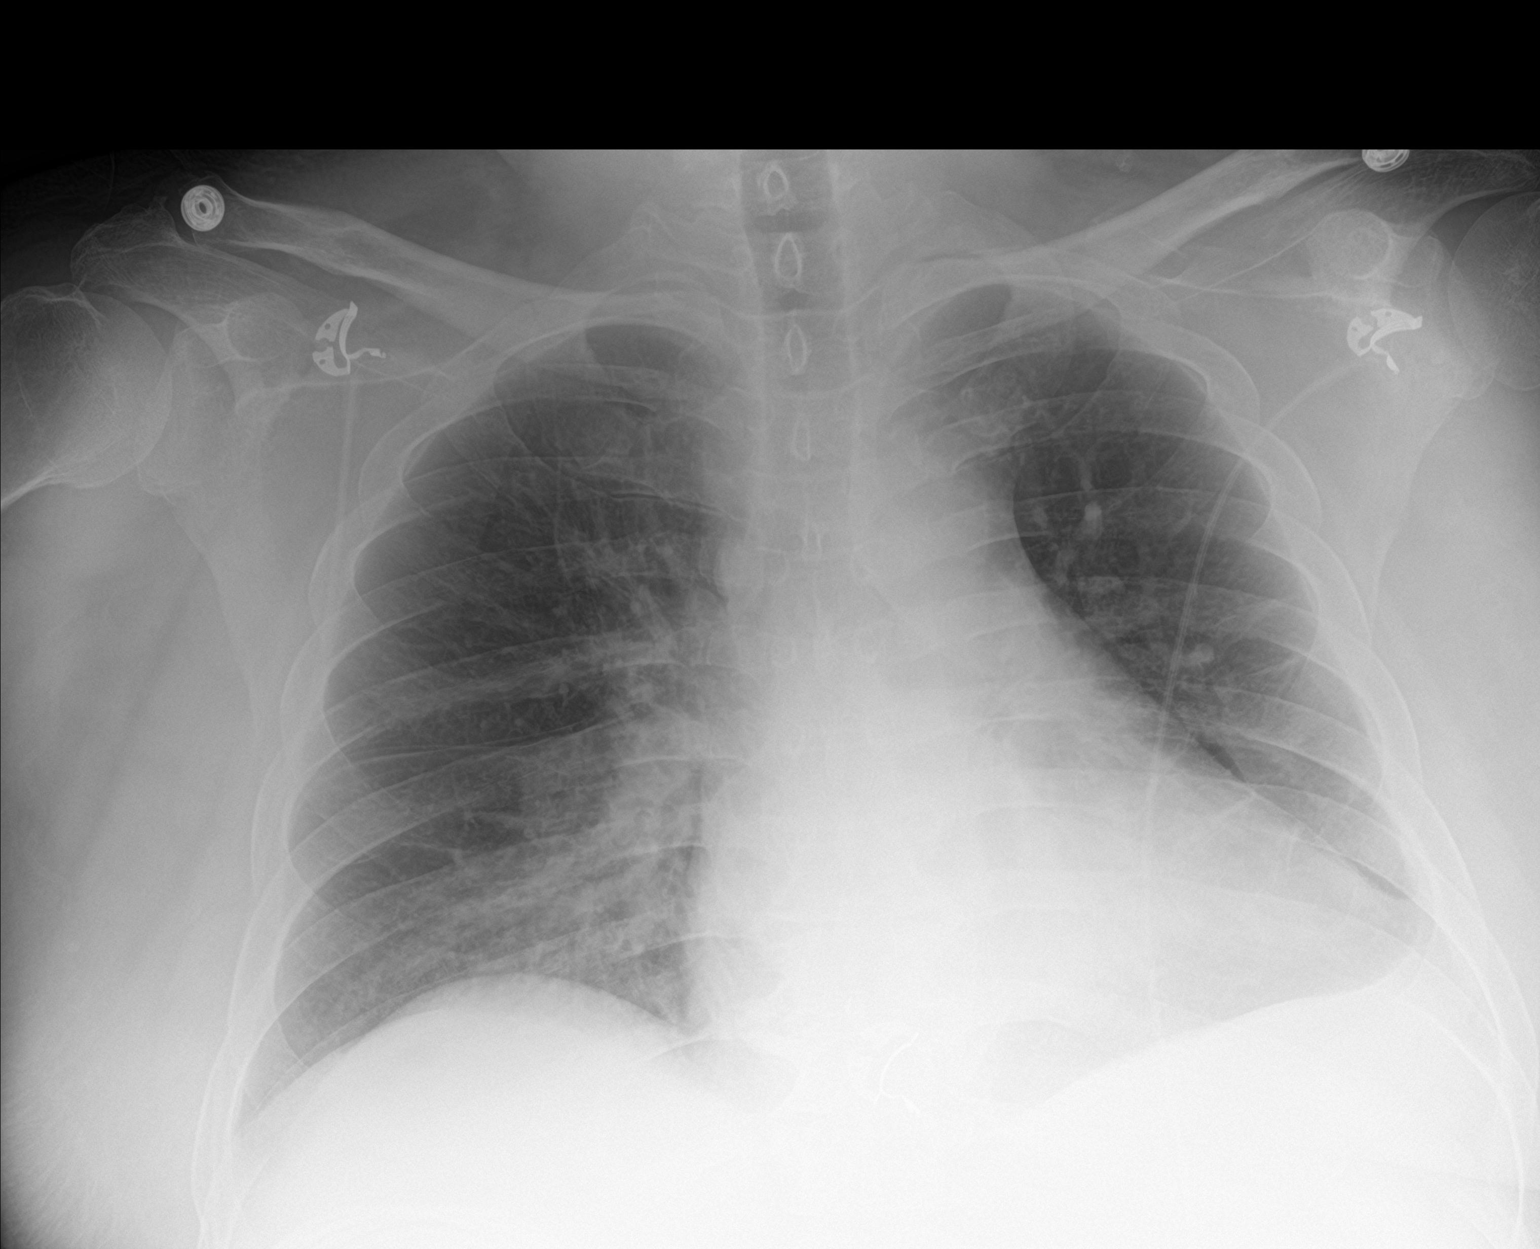

[1 of 1 positions shown; findings below may reference images not displayed]

FINDINGS: Portable AP semi upright view at [5M] hours. No pneumothorax.
Improved left lung base ventilation status post thoracentesis.
Stable patchy right lung base opacity. Mediastinal contours remain
normal. Visualized tracheal air column is within normal limits. No
pulmonary edema. No acute osseous abnormality identified.
IMPRESSION: No pneumothorax and improved left lung base ventilation status post
left side thoracentesis.

## 2019-11-08 IMAGING — US IR THORACENTESIS ASP PLEURAL SPACE W/IMG GUIDE
1 series · 4 of 4 positions shown · non-contrast
Comparison: none

INDICATION: Shortness of breath. Pneumonia. Left-sided pleural effusion. Request
for therapeutic thoracentesis.

[Series 1: ir (id) (id)/(id)/(id) ir · 4 acquisitions, 4 frames shown]
[im 1/4]
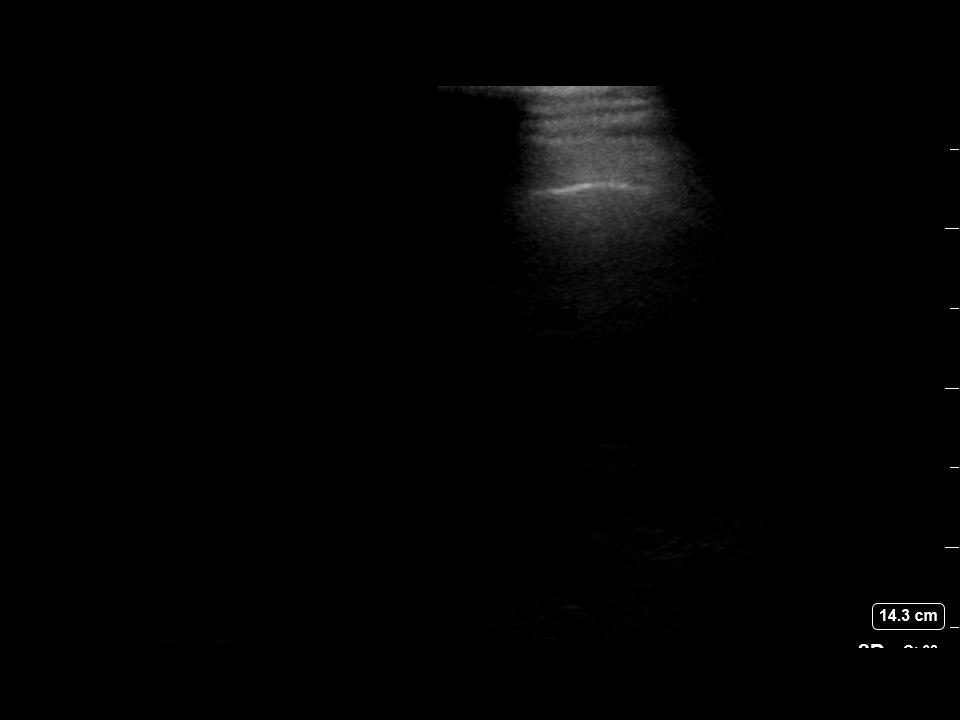
[im 2/4]
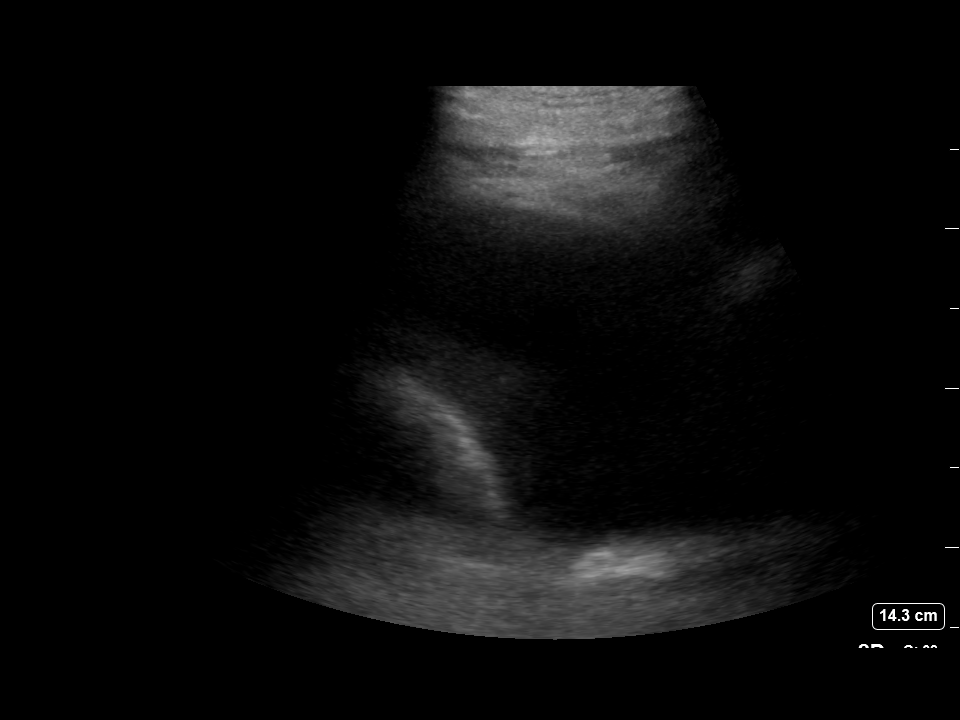
[im 3/4]
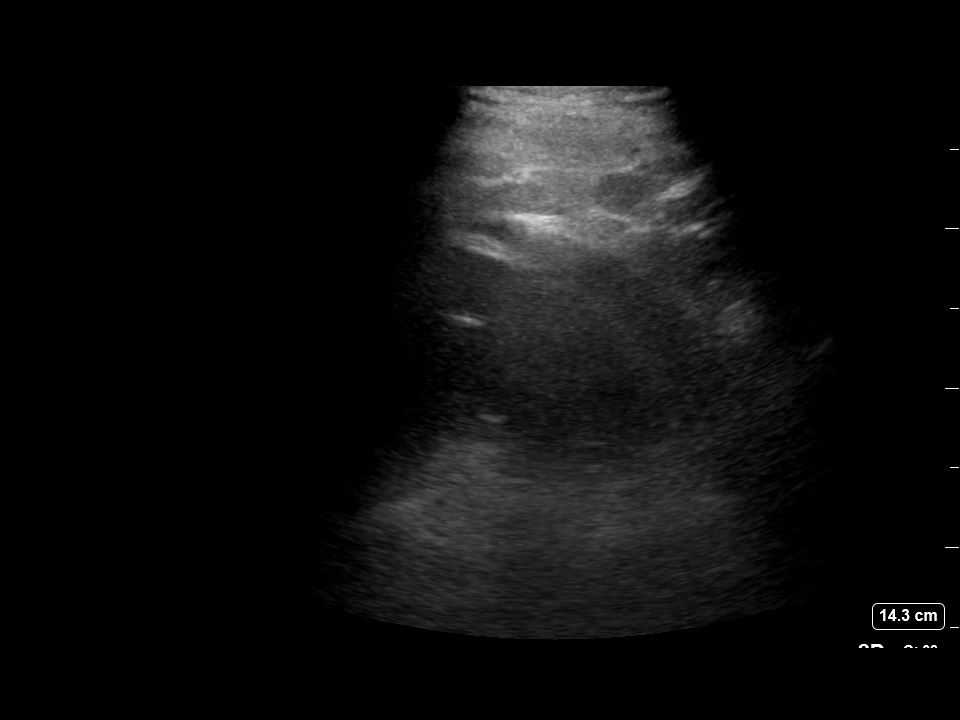
[im 4/4]
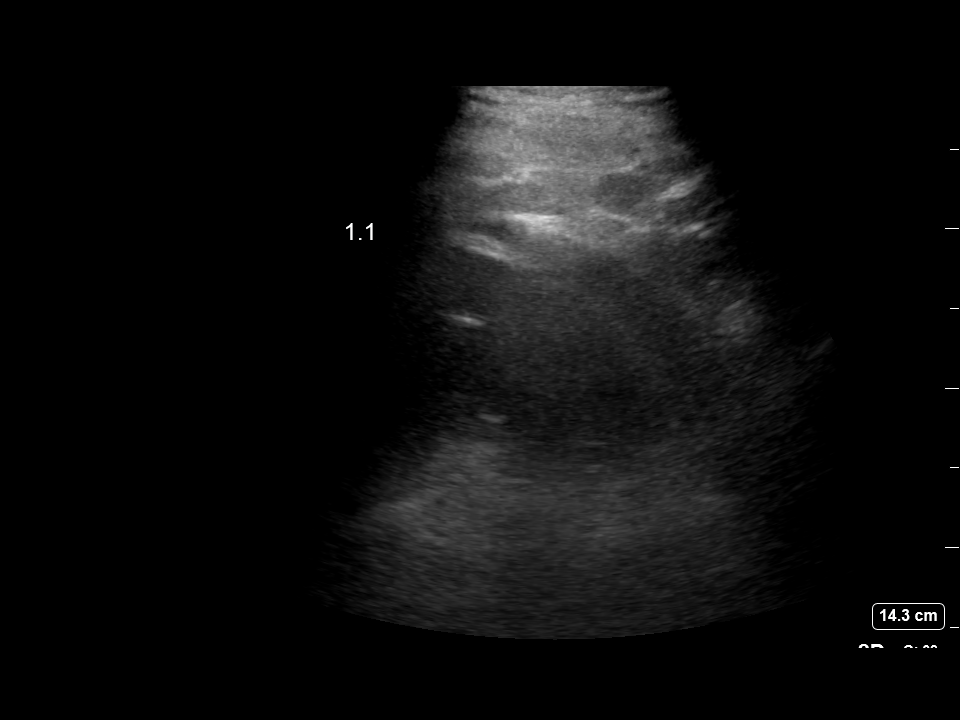

[4 of 4 positions shown; findings below may reference images not displayed]

EXAM:
ULTRASOUND GUIDED LEFT THORACENTESIS

MEDICATIONS:
None.

COMPLICATIONS:
None immediate.

PROCEDURE:
An ultrasound guided thoracentesis was thoroughly discussed with the
patient and questions answered. The benefits, risks, alternatives
and complications were also discussed. The patient understands and
wishes to proceed with the procedure. Written consent was obtained.

Ultrasound was performed to localize and mark an adequate pocket of
fluid in the LEFT chest. The area was then prepped and draped in the
normal sterile fashion. 1% Lidocaine was used for local anesthesia.
Under ultrasound guidance a 6 Fr Safe-T-Centesis catheter was
introduced. Thoracentesis was performed. The catheter was removed
and a dressing applied.
FINDINGS: A total of approximately 1.1 L of clear yellow fluid was removed.
IMPRESSION: Successful ultrasound guided left thoracentesis yielding 1.1 L of
pleural fluid.

## 2019-11-08 MED ORDER — TRAMADOL HCL 50 MG PO TABS
ORAL_TABLET | ORAL | Status: AC
  Filled 2019-11-08: qty 1

## 2019-11-08 MED ORDER — LIDOCAINE HCL 1 % IJ SOLN
INTRAMUSCULAR | Status: AC
  Filled 2019-11-08: qty 20

## 2019-11-08 MED ORDER — VANCOMYCIN HCL IN DEXTROSE 1-5 GM/200ML-% IV SOLN
INTRAVENOUS | Status: AC
  Filled 2019-11-08: qty 200

## 2019-11-08 MED ORDER — OXYCODONE HCL 5 MG PO TABS
ORAL_TABLET | ORAL | Status: AC
  Filled 2019-11-08: qty 1

## 2019-11-08 NOTE — Procedures (Signed)
PROCEDURE SUMMARY:  Successful US guided left thoracentesis. Yielded 1.1 L of clear yellow fluid. Pt tolerated procedure well. No immediate complications.  Specimen was not sent for labs. CXR ordered.  EBL < 5 mL  Ascencion Dike PA-C 11/08/2019 3:47 PM

## 2019-11-08 NOTE — Progress Notes (Signed)
PROGRESS NOTE  Michael Riggs  DOB: [birth year]  PCP: [name], MD SNK:[id number]  DOA: 11/05/2019  LOS: 2 days   Chief Complaint  Patient presents with  . Shortness of Breath   Brief narrative: Patient is a 49 y.o. year-old with hx of ESRD on HD, DM2, calciphylaxis bilat LE's, left BKA status, chronic hypotension on midodrine. Patient presented to ED on 4/18 for chills and sweats for few days. He was recently hospitalized for pneumonia and completed the course of oral antibiotics post discharge.  He had his 2nd COVID shot 10 days prior. After that, he started having dry cough and tiredness. Patient also reports that he had been liberal with his fluids for previous few days due to the fact that he was having a lot of night sweats. In ED, CTA chest showed no PE but showed worsening L effusion and R effusion.   COVID was neg.   Hb 11.7.   CXR was concerning for more infection in LLL. Pt was admitted and started on IV abx empricially. CT chest showed left infiltrate and left pleural effusion.   He was admitted under hospitalist service primarily for pneumonia.   Nephrology consultation was obtained for dialysis. See below for details.  Subjective: Patient was seen and examined this afternoon. Pleasant middle-aged Hispanic male.  Sitting up at the edge of the bed.  Taking his meds.  Not in distress.  Feels less short of breath. He is concerned about the persistent pleural effusion and wants thoracentesis done. Not on supplemental oxygen at rest.  Used CPAP last night.  Assessment/Plan: Left lower lobe pneumonia -Presented with shortness of breath, dry cough, fatigue after Covid vaccination.   -Covid PCR negative in the ED  -CT scan showed a possible left lower lobe infiltrate -Currently on broad-spectrum antibiotics. -Respiratory status seems to be improving patient is apprehensive about the risk of recurrent admission. -Continue empiric antibiotics for another 24 hours.   No growth on blood culture so far.  Left pleural effusion -Patient has chronic stable pleural effusion. -He wants a thoracentesis tried.  Ordered it today.  End-stage renal disease -MWF dialysis.  Had one this morning.  History of type 2 diabetes mellitus -Most recent A1c 5.3. -CBG under control.  Essential hypertension -On amlodipine chronically for hypertension. -Blood pressure running 150s today.  I discussed with nephrology.  DC midodrine.  Chest pain-pleuritic, worse with coughing, no concerns for cardiac etiology. High-sensitivity troponins negative x2.   History of calciphylaxis-continue pain control with home oxycodone and tramadol.  Lesions on bilateral lower extremities appear to be significantly better per patient  Left BKA -noted  Anemia of chronic renal disease-hemoglobin stable, no bleeding, 10.9 this morning  OSA-continue nightly CPAP  Hyperlipidemia-continue atorvastatin  GERD-continue PPI  Mobility: Patient is wheelchair-bound.  Also uses prosthesis on the left leg but he is in process of getting a new prosthesis Code Status:  Full code  DVT prophylaxis:  SCDs Antimicrobials:  IV cefepime and IV vancomycin Fluid: None Diet: Renal diet  Consultants: Nephrology Family Communication:  None at bedside  Status is: Inpatient  Remains inpatient appropriate because:IV treatments appropriate due to intensity of illness or inability to take PO   Dispo: The patient is from: Home              Anticipated d/c is to: Home              Anticipated d/c date is: 2 days  Patient currently is not medically stable to d/c.  Antimicrobials: Anti-infectives (From admission, onward)   Start     Dose/Rate Route Frequency Ordered Stop   11/08/19 1800  ceFEPIme (MAXIPIME) 2 g in sodium chloride 0.9 % 100 mL IVPB     2 g 200 mL/hr over 30 Minutes Intravenous Every M-W-F (1800) 11/07/19 1157     11/08/19 1200  ceFEPIme (MAXIPIME) 2 g in sodium  chloride 0.9 % 100 mL IVPB  Status:  Discontinued     2 g 200 mL/hr over 30 Minutes Intravenous Every M-W-F (Hemodialysis) 11/06/19 0612 11/07/19 1157   11/08/19 1200  vancomycin (VANCOCIN) IVPB 1000 mg/200 mL premix     1,000 mg 200 mL/hr over 60 Minutes Intravenous Every M-W-F (Hemodialysis) 11/06/19 0612     11/08/19 0907  vancomycin (VANCOCIN) 1-5 GM/200ML-% IVPB    Note to Pharmacy: Belinda Fisher   : cabinet override      11/08/19 0907 11/08/19 1127   11/07/19 1800  ceFEPIme (MAXIPIME) 1 g in sodium chloride 0.9 % 100 mL IVPB     1 g 200 mL/hr over 30 Minutes Intravenous  Once 11/07/19 1157 11/07/19 1917   11/06/19 0530  ceFEPIme (MAXIPIME) 1 g in sodium chloride 0.9 % 100 mL IVPB     1 g 200 mL/hr over 30 Minutes Intravenous  Once 11/06/19 0519 11/06/19 0628   11/06/19 0530  vancomycin (VANCOREADY) IVPB 2000 mg/400 mL     2,000 mg 200 mL/hr over 120 Minutes Intravenous NOW 11/06/19 0522 11/06/19 0929        Code Status: Full Code   Diet Order            Diet regular Room service appropriate? Yes; Fluid consistency: Thin; Fluid restriction: 1500 mL Fluid  Diet effective now              Infusions:  . ceFEPime (MAXIPIME) IV    . vancomycin 1,000 mg (11/08/19 0913)    Scheduled Meds: . amLODipine  5 mg Oral QHS  . atorvastatin  40 mg Oral QHS  . buPROPion  300 mg Oral QHS  . Chlorhexidine Gluconate Cloth  6 each Topical Q0600  . Chlorhexidine Gluconate Cloth  6 each Topical Q0600  . hydrocortisone cream   Topical BID  . insulin aspart  0-9 Units Subcutaneous TID WC  . pantoprazole  40 mg Oral QHS  . sucroferric oxyhydroxide  1,000 mg Oral TID WC  . tamsulosin  0.4 mg Oral QHS    PRN meds: albuterol, ipratropium-albuterol, loperamide, melatonin, oxyCODONE, phenol, traMADol   Objective: Vitals:   11/07/19 2258 11/08/19 0714  BP: (!) 154/88 (!) 153/86  Pulse: 83 90  Resp: 20 18  Temp: 98.4 F (36.9 C) 98.6 F (37 C)  SpO2: 100% 98%   No intake or output  data in the 24 hours ending 11/08/19 1404 Filed Weights   11/06/19 0320 11/07/19 1300  Weight: 97 kg 98.2 kg   Weight change:  Body mass index is 31.06 kg/m.   Physical Exam: General exam: Appears calm and comfortable.  Not in physical distress Skin: No rashes, lesions or ulcers. HEENT: Atraumatic, normocephalic, supple neck, no obvious bleeding Lungs: Minimal rales present in both bases CVS: Regular rate and rhythm, no murmur GI/Abd soft, nontender, nondistended, bowel sound present CNS: Alert, awake, oriented x3 Psychiatry: Mood appropriate Extremities: No pedal edema, no calf tenderness.  Left leg BKA status  Data Review: I have personally reviewed the laboratory data and studies  available.  Recent Labs  Lab 11/05/19 1926 11/07/19 0148 11/08/19 0251  WBC 6.1 7.4 5.1  HGB 11.7* 10.9* 10.9*  HCT 38.3* 35.2* 35.1*  MCV 95.3 94.4 93.4  PLT 230 218 227   Recent Labs  Lab 11/05/19 1926 11/07/19 0148 11/08/19 0251  NA 138 138 135  K 4.0 5.1 4.0  CL 96* 97* 96*  CO2 26 23 25   GLUCOSE 136* 76 104*  BUN 35* 50* 31*  CREATININE 7.92* 9.95* 7.23*  CALCIUM 8.7* 8.3* 8.1*  PHOS  --   --  4.3    Signed, Terrilee Croak, MD [facility] Pager: [phone] (Secure Chat preferred). 11/08/2019

## 2019-11-08 NOTE — Progress Notes (Signed)
[facility] Progress Note   Subjective: Seen on HD. Increased dense left lower lobe pneumonia or atelectasis on CXR. Denies SOB at present. BP high, UFG adjusted.   Objective Vitals:   11/07/19 1300 11/07/19 1316 11/07/19 1653 11/07/19 2258  BP: (!) 152/86 (!) 147/86 (!) 165/92 (!) 154/88  Pulse: 84 84 86 83  Resp:  15 16 20   Temp:  97.8 F (36.6 C)  98.4 F (36.9 C)  TempSrc:  Oral    SpO2:  96% 97% 100%  Weight: 98.2 kg     Height:       Physical Exam General: Chronically ill appearing male in NAD Heart: S1,S2 no M/R/G Lungs: Bilateral breath sounds decreased in bases otherwise CTAB Abdomen: Active BS Extremities: L BKA No edema RLE still with LE wounds in various stages of healing.  Dialysis Access: R AVF blood lines connected   Additional Objective Labs: Basic Metabolic Panel: Recent Labs  Lab 11/05/19 1926 11/07/19 0148 11/08/19 0251  NA 138 138 135  K 4.0 5.1 4.0  CL 96* 97* 96*  CO2 26 23 25   GLUCOSE 136* 76 104*  BUN 35* 50* 31*  CREATININE 7.92* 9.95* 7.23*  CALCIUM 8.7* 8.3* 8.1*   Liver Function Tests: Recent Labs  Lab 11/07/19 0148 11/08/19 0251  AST 24 14*  ALT 28 20  ALKPHOS 135* 129*  BILITOT 0.8 0.8  PROT 6.9 6.8  ALBUMIN 3.1* 2.9*   No results for input(s): LIPASE, AMYLASE in the last 168 hours. CBC: Recent Labs  Lab 11/05/19 1926 11/07/19 0148 11/08/19 0251  WBC 6.1 7.4 5.1  HGB 11.7* 10.9* 10.9*  HCT 38.3* 35.2* 35.1*  MCV 95.3 94.4 93.4  PLT 230 218 227   Blood Culture    Component Value Date/Time   SDES BLOOD RIGHT ANTECUBITAL 11/06/2019 1715   SPECREQUEST  11/06/2019 1715    BOTTLES DRAWN AEROBIC AND ANAEROBIC Blood Culture adequate volume   CULT  11/06/2019 1715    NO GROWTH < 24 HOURS Performed at [facility], [place] [address], [place], [place] [address]    REPTSTATUS PENDING 11/06/2019 1715    Cardiac Enzymes: No results for input(s): CKTOTAL, CKMB, CKMBINDEX, TROPONINI in the last 168  hours. CBG: Recent Labs  Lab 11/07/19 1406 11/07/19 1654 11/07/19 2036 11/07/19 2259 11/08/19 0321  GLUCAP 112* 140* 207* 142* 120*   Iron Studies: No results for input(s): IRON, TIBC, TRANSFERRIN, FERRITIN in the last 72 hours. @lablastinr3 @ Studies/Results: DG Chest 2 View  Result Date: 11/07/2019 CLINICAL DATA:  Shortness of breath. EXAM: CHEST - 2 VIEW COMPARISON:  11/05/2019 FINDINGS: Stable left pleural effusion. Dense patchy opacity in the left lower lobe, increased. Stable mildly prominent interstitial markings and enlarged cardiac silhouette. Lower thoracic spine degenerative changes. IMPRESSION: 1. Increased dense left lower lobe pneumonia or atelectasis. 2. Stable left pleural effusion. 3. Stable cardiomegaly and mild chronic interstitial lung disease. Electronically Signed   By: Claudie Revering M.D.   On: 11/07/2019 20:00   Medications: . ceFEPime (MAXIPIME) IV    . vancomycin Stopped (11/07/19 1143)   . amLODipine  5 mg Oral QHS  . atorvastatin  40 mg Oral QHS  . buPROPion  300 mg Oral QHS  . Chlorhexidine Gluconate Cloth  6 each Topical Q0600  . Chlorhexidine Gluconate Cloth  6 each Topical Q0600  . hydrocortisone cream   Topical BID  . insulin aspart  0-9 Units Subcutaneous TID WC  . midodrine  10 mg Oral q1800  .  pantoprazole  40 mg Oral QHS  . sucroferric oxyhydroxide  1,000 mg Oral TID WC  . tamsulosin  0.4 mg Oral QHS  . traMADol         Outpt HD:MWF  5h 400/800 97.5kg 2/2 bath Hep none - Na thio 25 gm tid - venofer 50 mg /wk   Assessment/ Plan: 1. PNA - LLL by CT w/ effusion. On IV abx per pmd. Repeat CXR Increased dense left lower lobe pneumonia or atelectasis. Add incentive spirometry.  2. ESRD - on HD MWF. HD today on schedule.  3. HTN/ vol - BP's okay. No ^vol on exam but BP high. 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 Depression - cont meds 7. 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 Cont binder, not on vdra or sensipar   Sumedha Munnerlyn H. Tyrone Balash NP-C 11/08/2019, 8:57 AM  [employer] [phone]

## 2019-11-08 NOTE — Consult Note (Signed)
   [facility] Inpatient Consult   11/08/2019  Michael Riggs 1971-06-05 [id number]  Patient screened for extreme high risk score for unplanned readmission score of 32% and for less than 30 days readmission hospitalization.  Patient is in the [facility].  Patient with a past history of outreach withl [facility] services.     Review of patient's medical record reveals patient is from MD history and physical which includes but not limited to the following:  49 yo M with ESRD on HD MWF, DM, calciphylaxis, came in to the hospital with URI symptoms and fever/chills, CT with left infiltrate and left pleural effusion. He wa splaced on broad spectrum antibiotics and was admitted to the hospital. Patient also tells me that he has been liberal with his fluids due to the fact that he was having a lot of night sweats. He received the second Covid vaccine about 10 days ago and that is when all his symptoms started.   Primary Care Provider is [name], MD with [facility], this provider is listed to do the transition of care follow up.  Plan:  To follow patient's progress and disposition needs. ollow up with inpatient TOC team Continue to follow progress and disposition to assess for post hospital care management needs.    Please place a [facility] Care Management consult as appropriate and for questions contact:   Natividad Brood, RN BSN [facility] Liaison  [phone] business mobile phone Toll free office [phone]  Fax number: [phone] [email or web address]

## 2019-11-08 NOTE — Progress Notes (Signed)
RN placed patient on CPAP.  Patient vitals stable.

## 2019-11-09 LAB — GLUCOSE, CAPILLARY
Glucose-Capillary: 61 mg/dL — ABNORMAL LOW (ref 70–99)
Glucose-Capillary: 160 mg/dL — ABNORMAL HIGH (ref 70–99)
Glucose-Capillary: 139 mg/dL — ABNORMAL HIGH (ref 70–99)
Glucose-Capillary: 141 mg/dL — ABNORMAL HIGH (ref 70–99)
Glucose-Capillary: 109 mg/dL — ABNORMAL HIGH (ref 70–99)
Glucose-Capillary: 86 mg/dL (ref 70–99)

## 2019-11-09 MED ORDER — CHLORHEXIDINE GLUCONATE CLOTH 2 % EX PADS
6.0000 | MEDICATED_PAD | Freq: Every day | CUTANEOUS | Status: DC
  Administered 2019-11-09 – 2019-11-10 (×2): 6 via TOPICAL

## 2019-11-09 NOTE — Progress Notes (Signed)
PROGRESS NOTE  Michael Riggs  DOB: 02-Nov-1970  PCP: [name], MD UTM:[id number]  DOA: 11/05/2019  LOS: 3 days   Chief Complaint  Patient presents with  . Shortness of Breath   Brief narrative: Patient is a 49 y.o. year-old with hx of ESRD on HD, DM2, calciphylaxis bilat LE's, left BKA status, chronic hypotension on midodrine. Patient presented to ED on 4/18 for chills and sweats for few days. He was recently hospitalized for pneumonia and completed the course of oral antibiotics post discharge.  He had his 2nd COVID shot 10 days prior. After that, he started having dry cough and tiredness. Patient also reports that he had been liberal with his fluids for previous few days due to the fact that he was having a lot of night sweats. In ED, CTA chest showed no PE but showed worsening L effusion and R effusion.   COVID was neg.   Hb 11.7.   CXR was concerning for more infection in LLL. Pt was admitted and started on IV abx empricially. CT chest showed left infiltrate and left pleural effusion.   He was admitted under hospitalist service primarily for pneumonia.   Nephrology consultation was obtained for dialysis. See below for details.  Subjective: Patient was seen and examined this afternoon. Pleasant middle-aged Hispanic male.   Lying on bed.  Not in distress.  Feels better after thoracentesis yesterday.   Patient however failed low-grade temperature and night sweats last night.   Not on supplemental oxygen at this time.  He is concerned about the low-grade temperature and the risk of readmission.  Assessment/Plan: Left lower lobe pneumonia -Presented with shortness of breath, dry cough, fatigue after Covid vaccination.   -Covid PCR negative in the ED  -CT scan showed a possible left lower lobe infiltrate -Currently on broad-spectrum antibiotics with IV cefepime and IV vancomycin.  No history of MRSA infection in the past.  MRSA screening PCR negative as well.  No growth  in blood cultures so far I will DC vancomycin today.  Continue IV cefazolin.  Plan to switch to oral Augmentin at discharge.  Left pleural effusion -Patient has chronic stable pleural effusion. -Underwent 1.1 L thoracentesis yesterday.  End-stage renal disease -MWF dialysis.  Next session tomorrow morning.  History of type 2 diabetes mellitus Hypoglycemic episodes -Most recent A1c 5.3. -Patient states he lost significant weight which essentially cured his diabetes. -Blood sugar level low at 61 this morning.  Patient states he has had hypoglycemic episodes at home as well.  He takes cookies at night to avoid nocturnal hypoglycemia. -Not on any medicines for diabetes at this time.  Essential hypertension -On amlodipine chronically for hypertension. -Blood pressure running mostly elevated.  Midodrine was discontinued on 4/21 after discussion with nephrology.    Chest pain-pleuritic, worse with coughing, no concerns for cardiac etiology. High-sensitivity troponins negative x2.   History of calciphylaxis-continue pain control with home oxycodone and tramadol.  Lesions on bilateral lower extremities appear to be significantly better per patient  Left BKA -noted  Anemia of chronic renal disease-hemoglobin stable, no bleeding, 10.9 this morning  OSA-continue nightly CPAP  Hyperlipidemia-continue atorvastatin  GERD-continue PPI  Mobility: Patient is wheelchair-bound.  Also uses prosthesis on the left leg but he is in process of getting a new prosthesis Code Status:  Full code  DVT prophylaxis:  SCDs Antimicrobials:  IV cefepime Fluid: None Diet: Renal diet  Consultants: Nephrology Family Communication:  None at bedside  Status is: Inpatient  Remains inpatient  appropriate because:IV treatments appropriate due to intensity of illness or inability to take PO Dispo: The patient is from: Home              Anticipated d/c is to: Home              Anticipated d/c date is: 1  day              Patient currently is not medically stable to d/c.   Antimicrobials: Anti-infectives (From admission, onward)   Start     Dose/Rate Route Frequency Ordered Stop   11/08/19 1800  ceFEPIme (MAXIPIME) 2 g in sodium chloride 0.9 % 100 mL IVPB     2 g 200 mL/hr over 30 Minutes Intravenous Every M-W-F (1800) 11/07/19 1157     11/08/19 1200  ceFEPIme (MAXIPIME) 2 g in sodium chloride 0.9 % 100 mL IVPB  Status:  Discontinued     2 g 200 mL/hr over 30 Minutes Intravenous Every M-W-F (Hemodialysis) 11/06/19 0612 11/07/19 1157   11/08/19 1200  vancomycin (VANCOCIN) IVPB 1000 mg/200 mL premix     1,000 mg 200 mL/hr over 60 Minutes Intravenous Every M-W-F (Hemodialysis) 11/06/19 0612     11/08/19 0907  vancomycin (VANCOCIN) 1-5 GM/200ML-% IVPB    Note to Pharmacy: Belinda Fisher   : cabinet override      11/08/19 0907 11/08/19 1127   11/07/19 1800  ceFEPIme (MAXIPIME) 1 g in sodium chloride 0.9 % 100 mL IVPB     1 g 200 mL/hr over 30 Minutes Intravenous  Once 11/07/19 1157 11/07/19 1917   11/06/19 0530  ceFEPIme (MAXIPIME) 1 g in sodium chloride 0.9 % 100 mL IVPB     1 g 200 mL/hr over 30 Minutes Intravenous  Once 11/06/19 0519 11/06/19 0628   11/06/19 0530  vancomycin (VANCOREADY) IVPB 2000 mg/400 mL     2,000 mg 200 mL/hr over 120 Minutes Intravenous NOW 11/06/19 0522 11/06/19 0929        Code Status: Full Code   Diet Order            Diet regular Room service appropriate? Yes; Fluid consistency: Thin; Fluid restriction: 1500 mL Fluid  Diet effective now              Infusions:  . ceFEPime (MAXIPIME) IV 2 g (11/08/19 1752)  . vancomycin 1,000 mg (11/08/19 0913)    Scheduled Meds: . amLODipine  5 mg Oral QHS  . atorvastatin  40 mg Oral QHS  . buPROPion  300 mg Oral QHS  . Chlorhexidine Gluconate Cloth  6 each Topical Q0600  . hydrocortisone cream   Topical BID  . insulin aspart  0-9 Units Subcutaneous TID WC  . pantoprazole  40 mg Oral QHS  . sucroferric  oxyhydroxide  1,000 mg Oral TID WC  . tamsulosin  0.4 mg Oral QHS    PRN meds: albuterol, ipratropium-albuterol, loperamide, melatonin, oxyCODONE, phenol, traMADol   Objective: Vitals:   11/08/19 2254 11/09/19 0729  BP: (!) 154/90 139/79  Pulse: 83 87  Resp: (!) 29   Temp: 98 F (36.7 C) 98.1 F (36.7 C)  SpO2: 97% 96%    Intake/Output Summary (Last 24 hours) at 11/09/2019 1333 Last data filed at 11/08/2019 2000 Gross per 24 hour  Intake 220 ml  Output --  Net 220 ml   Filed Weights   11/06/19 0320 11/07/19 1300 11/08/19 1041  Weight: 97 kg 98.2 kg 99 kg   Weight change: 0.8 kg  Body mass index is 31.32 kg/m.   Physical Exam: General exam: Appears calm and comfortable.  Not in physical distress Skin: No rashes, lesions or ulcers. HEENT: Atraumatic, normocephalic, supple neck, no obvious bleeding Lungs: Continues to have minimal rales present in both bases CVS: Regular rate and rhythm, no murmur GI/Abd soft, nontender, nondistended, bowel sound present CNS: Alert, awake, oriented x3 Psychiatry: Mood appropriate Extremities: No pedal edema, no calf tenderness.  Left leg BKA status  Data Review: I have personally reviewed the laboratory data and studies available.  Recent Labs  Lab 11/05/19 1926 11/07/19 0148 11/08/19 0251  WBC 6.1 7.4 5.1  HGB 11.7* 10.9* 10.9*  HCT 38.3* 35.2* 35.1*  MCV 95.3 94.4 93.4  PLT 230 218 227   Recent Labs  Lab 11/05/19 1926 11/07/19 0148 11/08/19 0251  NA 138 138 135  K 4.0 5.1 4.0  CL 96* 97* 96*  CO2 26 23 25   GLUCOSE 136* 76 104*  BUN 35* 50* 31*  CREATININE 7.92* 9.95* 7.23*  CALCIUM 8.7* 8.3* 8.1*  PHOS  --   --  4.3    Signed, Terrilee Croak, MD [facility] Pager: [phone] (Secure Chat preferred). 11/09/2019

## 2019-11-09 NOTE — Progress Notes (Addendum)
[facility] Progress Note  Subjective: no new c/o  Vitals:   11/08/19 1030 11/08/19 1041 11/08/19 2254 11/09/19 0729  BP: (!) 163/95 (!) 161/90 (!) 154/90 139/79  Pulse: 91 91 83 87  Resp: 18 18 (!) 29   Temp:  99.6 F (37.6 C) 98 F (36.7 C) 98.1 F (36.7 C)  TempSrc:  Oral Oral   SpO2:   97% 96%  Weight:  99 kg    Height:        Exam: Physical Exam General: Chronically ill appearing male in NAD Heart: S1,S2 no M/R/G Lungs: decreased in bases otherwise CTAB Abdomen: Active BS Extremities: L BKA No edema RLE, wounds mostly healed over Dialysis Access: R AVF +  Outpt HD:MWF  5h 400/800 97.5kg 2/2 bath Hep none   RUE AVF - Na thio 25 gm tid - venofer 50 mg /wk   Assessment/ Plan: 1. PNA - LLL by CT w/ effusion.Repeat CXR Increased dense left lower lobe pneumonia or atelectasis. Getting IV abx. Had 1 L thoracentesis on left yest 4/21.  LG temp yest. Per primary team.  2. ESRD - on HD MWF.HD Friday.  3. HTN/ vol - BP's okay. Up 1.5 kg. UF 1-2 L tomorrow.  4. Calciphylaxis - good recovery 5. L BKA 6. Depression - cont meds 7. 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 Cont binder, not on vdra or sensipar     Rob Michael Riggs 11/09/2019, 11:08 AM   Recent Labs  Lab 11/07/19 0148 11/08/19 0251  K 5.1 4.0  BUN 50* 31*  CREATININE 9.95* 7.23*  CALCIUM 8.3* 8.1*  PHOS  --  4.3  HGB 10.9* 10.9*   Inpatient medications: . amLODipine  5 mg Oral QHS  . atorvastatin  40 mg Oral QHS  . buPROPion  300 mg Oral QHS  . Chlorhexidine Gluconate Cloth  6 each Topical Q0600  . Chlorhexidine Gluconate Cloth  6 each Topical Q0600  . hydrocortisone cream   Topical BID  . insulin aspart  0-9 Units Subcutaneous TID WC  . pantoprazole  40 mg Oral QHS  . sucroferric oxyhydroxide  1,000 mg Oral TID WC  . tamsulosin  0.4 mg Oral QHS   . ceFEPime (MAXIPIME) IV 2 g (11/08/19 1752)  . vancomycin 1,000 mg (11/08/19 0913)    albuterol, ipratropium-albuterol, loperamide, melatonin, oxyCODONE, phenol, traMADol

## 2019-11-09 NOTE — Plan of Care (Signed)

## 2019-11-09 NOTE — Progress Notes (Signed)
Pharmacy Antibiotic Note  Michael Riggs is a 49 y.o. male admitted on 11/05/2019 with pneumonia.  Pharmacy has been consulted for Vancomycin and Cefepime dosing.   The patient is ESRD-MWF and is noted to receive HD off-schedule on Tuesday, then got back on schedule Wednesday, 4/21.   Plan: - Continue Cefepime 2g on MWF @ 1800 - Will continue to follow HD schedule/duration, culture results, LOT, and antibiotic de-escalation plans    Height: 5\' 10"  (177.8 cm) Weight: 99 kg (218 lb 4.1 oz) IBW/kg (Calculated) : 73  Temp (24hrs), Avg:98.1 F (36.7 C), Min:98 F (36.7 C), Max:98.1 F (36.7 C)  Recent Labs  Lab 11/05/19 1926 11/07/19 0148 11/08/19 0251  WBC 6.1 7.4 5.1  CREATININE 7.92* 9.95* 7.23*    Estimated Creatinine Clearance: 14.9 mL/min (A) (by C-G formula based on SCr of 7.23 mg/dL (H)).    No Known Allergies  Antimicrobials this admission: 4/19 Vanc >> 4/22 4/19 Cefepime >>   Microbiology results: 4/19 BCx:   Thank you for allowing pharmacy to be a part of this patient's care.  Alycia Rossetti, PharmD, BCPS Clinical Pharmacist Clinical phone for 11/09/2019: [phone] 11/09/2019 2:49 PM   **Pharmacist phone directory can now be found on [email or web address] ([email or web address]).  Listed under [place].

## 2019-11-10 LAB — RENAL FUNCTION PANEL
Albumin: 2.6 g/dL — ABNORMAL LOW (ref 3.5–5.0)
Anion gap: 12 (ref 5–15)
BUN: 53 mg/dL — ABNORMAL HIGH (ref 6–20)
CO2: 25 mmol/L (ref 22–32)
Calcium: 8.3 mg/dL — ABNORMAL LOW (ref 8.9–10.3)
Chloride: 95 mmol/L — ABNORMAL LOW (ref 98–111)
Creatinine, Ser: 8.29 mg/dL — ABNORMAL HIGH (ref 0.61–1.24)
GFR calc Af Amer: 8 mL/min — ABNORMAL LOW (ref >60)
GFR calc non Af Amer: 7 mL/min — ABNORMAL LOW (ref >60)
Glucose, Bld: 113 mg/dL — ABNORMAL HIGH (ref 70–99)
Phosphorus: 3.9 mg/dL (ref 2.5–4.6)
Potassium: 5.2 mmol/L — ABNORMAL HIGH (ref 3.5–5.1)
Sodium: 132 mmol/L — ABNORMAL LOW (ref 135–145)

## 2019-11-10 LAB — CBC
HCT: 33.4 % — ABNORMAL LOW (ref 39.0–52.0)
Hemoglobin: 10.4 g/dL — ABNORMAL LOW (ref 13.0–17.0)
MCH: 29.1 pg (ref 26.0–34.0)
MCHC: 31.1 g/dL (ref 30.0–36.0)
MCV: 93.3 fL (ref 80.0–100.0)
Platelets: 203 10*3/uL (ref 150–400)
RBC: 3.58 MIL/uL — ABNORMAL LOW (ref 4.22–5.81)
RDW: 16.4 % — ABNORMAL HIGH (ref 11.5–15.5)
WBC: 8.3 10*3/uL (ref 4.0–10.5)
nRBC: 0 % (ref 0.0–0.2)

## 2019-11-10 LAB — GLUCOSE, CAPILLARY
Glucose-Capillary: 121 mg/dL — ABNORMAL HIGH (ref 70–99)
Glucose-Capillary: 110 mg/dL — ABNORMAL HIGH (ref 70–99)
Glucose-Capillary: 89 mg/dL (ref 70–99)

## 2019-11-10 MED ORDER — AMOXICILLIN-POT CLAVULANATE 500-125 MG PO TABS
500.0000 mg | ORAL_TABLET | Freq: Every day | ORAL | 0 refills | Status: AC
Start: 2019-11-10 — End: 2019-11-15

## 2019-11-10 NOTE — Discharge Summary (Signed)
Physician Discharge Summary  Michael Riggs ZES:[id number] DOB: Jul 28, 1970 DOA: 11/05/2019  PCP: [name], MD  Admit date: 11/05/2019 Discharge date: 11/10/2019  Admitted From: Home Discharge disposition: Home   Code Status: Full Code  Diet Recommendation: Renal diet   Recommendations for Outpatient Follow-Up:   1. Follow-up with PCP as an outpatient 2. Follow-up with nephrology as an outpatient  Discharge Diagnosis:   Principal Problem:   Acute respiratory failure with hypoxia ([place]) Active Problems:   ESRD (end stage renal disease) ([place])   Essential hypertension   Anemia of chronic disease   ESRD on dialysis ([place])   Diabetes mellitus with peripheral vascular disease ([place])    History of Present Illness / Brief narrative:  Patient is a49 y.o.year-old with hx of ESRD on HD, DM2, calciphylaxis bilat LE's, left BKA status, chronic hypotension on midodrine. Patient presented to ED on 4/18 for chills and sweats for few days. He was recently hospitalized for pneumonia and completed the course of oral antibiotics post discharge.  He had his 2nd COVID shot 10 days prior. After that, he started having dry cough and tiredness. Patient also reports that he had been liberal with his fluids for previous few days due to the fact that he was having a lot of night sweats. In ED, CTA chest showed no PE but showed worsening L effusion and R effusion.  COVID was neg.  Hb 11.7.  CXR was concerning for more infection in LLL. Pt was admitted and started on IV abx empricially. CT chest showed left infiltrate and left pleural effusion.   He was admitted under hospitalist service primarily for pneumonia.   Nephrology consultation was obtained for dialysis. See below for details.  Hospital Course:  Left lower lobe pneumonia -Presented with shortness of breath, dry cough, fatigue after Covid vaccination.   -Covid PCR negative in the ED  -CT scan showed a possible left lower lobe  infiltrate -Initially started on broad-spectrum IV antibiotics IV cefepime and IV vancomycin. No history of MRSA infection in the past.  MRSA screening PCR negative as well.  No growth in blood cultures.  IV vancomycin was stopped yesterday.  -Plan to discharge home today on so far I will DC vancomycin today.  Continue IV cefazolin.  Plan to switch to oral Augmentin 500 mg daily for 5 days at discharge.  Left pleural effusion -Patient has chronic stable pleural effusion. -Underwent 1.1 L thoracentesis on 4/21.  End-stage renal disease -MWF dialysis.   Patient is currently getting today's session.  History of type 2 diabetes mellitus Hypoglycemic episodes -Most recent A1c 5.3. -Patient states he lost significant weight which essentially cured his diabetes.   -Patient states that he gets hypoglycemic episodes at home sometimes. He takes cookies at night to avoid nocturnal hypoglycemia. -Not on any medicines for diabetes at this time.  Essential hypertension -On amlodipine chronically for hypertension. -Blood pressure running mostly elevated.  Midodrine was discontinued on 4/21 after discussion with nephrology.    Chest pain-pleuritic, worse with coughing, no concerns for cardiac etiology. High-sensitivity troponins negative x2.  History of calciphylaxis-continue pain controlwith home oxycodone and tramadol. Lesions on bilateral lower extremities appear to be significantly better per patient  Left BKA-noted  Anemia of chronic renal disease-hemoglobin stable, no bleeding,remains stable.  OSA-continuenightlyCPAP  Hyperlipidemia-continue atorvastatin  GERD-continue PPI  Mobility: Patient is wheelchair-bound.  Also uses prosthesis on the left leg but he is in process of getting a new prosthesis Code Status: Full code  Stable for  discharge home today.  Subjective:  Seen and examined this morning.  Pleasant, middle-aged Hispanic male.  Lying on bed.  Not in  distress.  Breathing better.  On room air.  Feels ready to go home.  Discharge Exam:   Vitals:   11/10/19 1149 11/10/19 1200 11/10/19 1230 11/10/19 1300  BP: (!) 150/89 136/80 (!) 143/86 (!) 155/89  Pulse: 84 84 85 89  Resp: 18     Temp: 97.7 F (36.5 C)     TempSrc: Oral     SpO2: 97%     Weight: 102.3 kg     Height:        Body mass index is 32.36 kg/m.  General exam: Appears calm and comfortable.  Skin: No rashes, lesions or ulcers. HEENT: Atraumatic, normocephalic, supple neck, no obvious bleeding Lungs: Mild stable crackles in the right base otherwise clear to auscultation bilaterally CVS: Regular rate and rhythm, no murmur GI/Abd soft, distended from obesity, nontender, bowel sound present CNS: Alert, awake monitor x3 Psychiatry: Mood appropriate Extremities: No pedal edema, no calf tenderness  Discharge Instructions:  Wound care: None Discharge Instructions    Diet - low sodium heart healthy   Complete by: As directed    Diet Carb Modified   Complete by: As directed    Increase activity slowly   Complete by: As directed      Follow-up Information    [name], MD Follow up.   Specialty: Internal Medicine Contact information: [place] [address] [phone]          Allergies as of 11/10/2019   No Known Allergies     Medication List    STOP taking these medications   midodrine 10 MG tablet Commonly known as: PROAMATINE     TAKE these medications   acetaminophen 325 MG tablet Commonly known as: TYLENOL Take 2 tablets (650 mg total) by mouth every 6 (six) hours as needed for mild pain (or Fever >/= 101).   albuterol 108 (90 Base) MCG/ACT inhaler Commonly known as: VENTOLIN HFA Inhale 2 puffs into the lungs every 6 (six) hours as needed for wheezing or shortness of breath.   amLODipine 5 MG tablet Commonly known as: NORVASC Take 5 mg by mouth at bedtime.   amoxicillin-clavulanate 500-125 MG tablet Commonly  known as: Augmentin Take 1 tablet (500 mg total) by mouth daily for 5 days.   atorvastatin 40 MG tablet Commonly known as: LIPITOR Take 1 tablet (40 mg total) by mouth daily. What changed: when to take this   buPROPion 300 MG 24 hr tablet Commonly known as: WELLBUTRIN XL Take 300 mg by mouth at bedtime.   collagenase ointment Commonly known as: SANTYL Apply 1 application topically daily. Apply to the affected area daily plus dry dressing   diclofenac Sodium 1 % Gel Commonly known as: VOLTAREN Apply 2 g topically 4 (four) times daily.   doxycycline 100 MG tablet Commonly known as: VIBRA-TABS Take 1 tablet (100 mg total) by mouth every 12 (twelve) hours.   Gerhardt's butt cream Crea Apply 1 application topically 3 (three) times daily as needed for irritation.   ipratropium-albuterol 0.5-2.5 (3) MG/3ML Soln Commonly known as: DUONEB Take 3 mLs by nebulization every 6 (six) hours as needed (For shortness of breath).   melatonin 5 MG Tabs Take 5 mg by mouth at bedtime as needed (sleep).   oxycodone 5 MG capsule Commonly known as: OXY-IR Take 1 capsule (5 mg total) by mouth  every 6 (six) hours as needed.   oxyCODONE 5 MG immediate release tablet Commonly known as: Oxy IR/ROXICODONE Take 5 mg by mouth 2 (two) times daily as needed for moderate pain.   pantoprazole 40 MG tablet Commonly known as: PROTONIX Take 1 tablet (40 mg total) by mouth daily. What changed: when to take this   tamsulosin 0.4 MG Caps capsule Commonly known as: FLOMAX Take 1 capsule (0.4 mg total) by mouth daily after breakfast. What changed: when to take this   traMADol 50 MG tablet Commonly known as: ULTRAM Take 1 tablet (50 mg total) by mouth every 8 (eight) hours as needed for moderate pain.   Velphoro 500 MG chewable tablet Generic drug: sucroferric oxyhydroxide Chew 1,000 mg by mouth 3 (three) times daily with meals.       Time coordinating discharge: 35 minutes  The results of  significant diagnostics from this hospitalization (including imaging, microbiology, ancillary and laboratory) are listed below for reference.    Procedures and Diagnostic Studies:   DG Chest 2 View  Result Date: 11/05/2019 CLINICAL DATA:  Pneumonia, shortness of breath EXAM: CHEST - 2 VIEW COMPARISON:  10/11/2019 FINDINGS: Small left pleural effusion. Left lower lung atelectasis or infiltrate again noted, similar to prior study. Right lung clear. Heart is borderline in size. IMPRESSION: Continued small left pleural effusion with left lower lung atelectasis or infiltrate/pneumonia. Electronically Signed   By: Rolm Baptise M.D.   On: 11/05/2019 20:13   CT Angio Chest PE W and/or Wo Contrast  Result Date: 11/06/2019 CLINICAL DATA:  49 year old male with shortness of breath. EXAM: CT ANGIOGRAPHY CHEST WITH CONTRAST TECHNIQUE: Multidetector CT imaging of the chest was performed using the standard protocol during bolus administration of intravenous contrast. Multiplanar CT image reconstructions and MIPs were obtained to evaluate the vascular anatomy. CONTRAST:  191mL OMNIPAQUE IOHEXOL 350 MG/ML SOLN COMPARISON:  CTA chest 04/12/2019. Chest radiographs yesterday. FINDINGS: Cardiovascular: Good contrast bolus timing in the pulmonary arterial tree. Stable mild central pulmonary artery enlargement since September (series 6, image 153). Mild respiratory motion. No focal filling defect identified in the pulmonary arteries to suggest acute pulmonary embolism. Little contrast in the aorta today. Mild Calcified aortic atherosclerosis. Cardiomegaly with extensive calcified coronary artery atherosclerosis and/or stents (series 6, image 196). No pericardial effusion. Mediastinum/Nodes: Stable small mediastinal lymph nodes, no lymphadenopathy. Lungs/Pleura: Small to moderate layering and sub pulmonic left pleural effusion with simple fluid density favoring a transudate. Volume of fluid is increased compared to that in  September. There is associated enhancing left lower lobe atelectasis. Right side pleural effusion has regressed since September with residual pleural thickening in the costophrenic angle and trace fluid (series 5, image 107). Dependent and peribronchial right lower lobe atelectasis which is enhancing. Superimposed mild gas trapping. Upper Abdomen: Negative visible liver, spleen, stomach. Musculoskeletal: No acute osseous abnormality identified. T6 benign vertebral body hemangioma again noted. No acute or suspicious osseous lesion. Review of the MIP images confirms the above findings. IMPRESSION: 1. Negative for acute pulmonary embolus. 2. Small to moderate layering and sub-pulmonic left pleural effusion has increased since September. Favor transudate. Associated left lung atelectasis. Regressed right pleural effusion since September with residual pleural thickening and trace fluid. And right lower lobe which opacity more resembles atelectasis than infection. 3. Cardiomegaly.  Calcified coronary artery atherosclerosis. Electronically Signed   By: Genevie Ann M.D.   On: 11/06/2019 04:41     Labs:   Basic Metabolic Panel: Recent Labs  Lab 11/05/19 1926 11/05/19  1926 11/07/19 0148 11/07/19 0148 11/08/19 0251 11/10/19 1200  NA 138  --  138  --  135 132*  K 4.0   < > 5.1   < > 4.0 5.2*  CL 96*  --  97*  --  96* 95*  CO2 26  --  23  --  25 25  GLUCOSE 136*  --  76  --  104* 113*  BUN 35*  --  50*  --  31* 53*  CREATININE 7.92*  --  9.95*  --  7.23* 8.29*  CALCIUM 8.7*  --  8.3*  --  8.1* 8.3*  PHOS  --   --   --   --  4.3 3.9   < > = values in this interval not displayed.   GFR Estimated Creatinine Clearance: 13.2 mL/min (A) (by C-G formula based on SCr of 8.29 mg/dL (H)). Liver Function Tests: Recent Labs  Lab 11/07/19 0148 11/08/19 0251 11/10/19 1200  AST 24 14*  --   ALT 28 20  --   ALKPHOS 135* 129*  --   BILITOT 0.8 0.8  --   PROT 6.9 6.8  --   ALBUMIN 3.1* 2.9* 2.6*   No results  for input(s): LIPASE, AMYLASE in the last 168 hours. No results for input(s): AMMONIA in the last 168 hours. Coagulation profile No results for input(s): INR, PROTIME in the last 168 hours.  CBC: Recent Labs  Lab 11/05/19 1926 11/07/19 0148 11/08/19 0251 11/10/19 1200  WBC 6.1 7.4 5.1 8.3  HGB 11.7* 10.9* 10.9* 10.4*  HCT 38.3* 35.2* 35.1* 33.4*  MCV 95.3 94.4 93.4 93.3  PLT 230 218 227 203   Cardiac Enzymes: No results for input(s): CKTOTAL, CKMB, CKMBINDEX, TROPONINI in the last 168 hours. BNP: Invalid input(s): POCBNP CBG: Recent Labs  Lab 11/09/19 1511 11/09/19 1743 11/09/19 2014 11/10/19 0750 11/10/19 1113  GLUCAP 141* 109* 86 121* 110*   D-Dimer No results for input(s): DDIMER in the last 72 hours. Hgb A1c No results for input(s): HGBA1C in the last 72 hours. Lipid Profile No results for input(s): CHOL, HDL, LDLCALC, TRIG, CHOLHDL, LDLDIRECT in the last 72 hours. Thyroid function studies No results for input(s): TSH, T4TOTAL, T3FREE, THYROIDAB in the last 72 hours.  Invalid input(s): FREET3 Anemia work up No results for input(s): VITAMINB12, FOLATE, FERRITIN, TIBC, IRON, RETICCTPCT in the last 72 hours. Microbiology Recent Results (from the past 240 hour(s))  Respiratory Panel by RT PCR (Flu A&B, Covid) - Nasopharyngeal Swab     Status: None   Collection Time: 11/06/19  1:23 AM   Specimen: Nasopharyngeal Swab  Result Value Ref Range Status   SARS Coronavirus 2 by RT PCR NEGATIVE NEGATIVE Final    Comment: (NOTE) SARS-CoV-2 target nucleic acids are NOT DETECTED. The SARS-CoV-2 RNA is generally detectable in upper respiratoy specimens during the acute phase of infection. The lowest concentration of SARS-CoV-2 viral copies this assay can detect is 131 copies/mL. A negative result does not preclude SARS-Cov-2 infection and should not be used as the sole basis for treatment or other patient management decisions. A negative result may occur with  improper  specimen collection/handling, submission of specimen other than nasopharyngeal swab, presence of viral mutation(s) within the areas targeted by this assay, and inadequate number of viral copies (<131 copies/mL). A negative result must be combined with clinical observations, patient history, and epidemiological information. The expected result is Negative. Fact Sheet for Patients:  PinkCheek.be Fact Sheet for Healthcare Providers:  GravelBags.it This test is not yet ap proved or cleared by the [place] and  has been authorized for detection and/or diagnosis of SARS-CoV-2 by FDA under an Emergency Use Authorization (EUA). This EUA will remain  in effect (meaning this test can be used) for the duration of the COVID-19 declaration under Section 564(b)(1) of the Act, 21 U.S.C. section 360bbb-3(b)(1), unless the authorization is terminated or revoked sooner.    Influenza A by PCR NEGATIVE NEGATIVE Final   Influenza B by PCR NEGATIVE NEGATIVE Final    Comment: (NOTE) The Xpert Xpress SARS-CoV-2/FLU/RSV assay is intended as an aid in  the diagnosis of influenza from Nasopharyngeal swab specimens and  should not be used as a sole basis for treatment. Nasal washings and  aspirates are unacceptable for Xpert Xpress SARS-CoV-2/FLU/RSV  testing. Fact Sheet for Patients: PinkCheek.be Fact Sheet for Healthcare Providers: GravelBags.it This test is not yet approved or cleared by the [place] FDA and  has been authorized for detection and/or diagnosis of SARS-CoV-2 by  FDA under an Emergency Use Authorization (EUA). This EUA will remain  in effect (meaning this test can be used) for the duration of the  Covid-19 declaration under Section 564(b)(1) of the Act, 21  U.S.C. section 360bbb-3(b)(1), unless the authorization is  terminated or revoked. Performed at [facility], [place] [address], [place], [place] [address]   Blood culture (routine x 2)     Status: None (Preliminary result)   Collection Time: 11/06/19  5:31 AM   Specimen: BLOOD RIGHT FOREARM  Result Value Ref Range Status   Specimen Description BLOOD RIGHT FOREARM  Final   Special Requests   Final    BOTTLES DRAWN AEROBIC AND ANAEROBIC Blood Culture adequate volume   Culture   Final    NO GROWTH 3 DAYS Performed at [facility], [place] [address], [place], [place] [address]    Report Status PENDING  Incomplete  Blood culture (routine x 2)     Status: None (Preliminary result)   Collection Time: 11/06/19  5:15 PM   Specimen: BLOOD  Result Value Ref Range Status   Specimen Description BLOOD RIGHT ANTECUBITAL  Final   Special Requests   Final    BOTTLES DRAWN AEROBIC AND ANAEROBIC Blood Culture adequate volume   Culture   Final    NO GROWTH 3 DAYS Performed at [facility], [place] [address] [place], [place] [address]    Report Status PENDING  Incomplete  MRSA PCR Screening     Status: None   Collection Time: 11/07/19  8:40 AM   Specimen: Nasopharyngeal  Result Value Ref Range Status   MRSA by PCR NEGATIVE NEGATIVE Final    Comment:        The GeneXpert MRSA Assay (FDA approved for NASAL specimens only), is one component of a comprehensive MRSA colonization surveillance program. It is not intended to diagnose MRSA infection nor to guide or monitor treatment for MRSA infections. Performed at [facility], [place] [address] [place], [place] [address]     Please note: You were cared for by a hospitalist during your hospital stay. Once you are discharged, your primary care physician will handle any further medical issues. Please note that NO REFILLS for any discharge medications will be authorized once you are discharged, as it is imperative that you return to your primary care physician (or establish a relationship with a primary care physician if you do not have  one) for your post hospital discharge needs so that they can reassess your need for medications and monitor your lab values.  Signed: Terrilee Croak  [facility] 11/10/2019, 1:11 PM

## 2019-11-10 NOTE — Progress Notes (Signed)
[facility] Progress Note  Subjective: no new c/o, chills/ night sweats "finally starting to ease off". Pt feeling a lot better.   Vitals:   11/10/19 1300 11/10/19 1330 11/10/19 1400 11/10/19 1430  BP: (!) 155/89 (!) 156/95 (!) 154/94 (!) 160/93  Pulse: 89 85 83 83  Resp:      Temp:      TempSrc:      SpO2:      Weight:      Height:        Exam: Physical Exam General: Chronically ill appearing male in NAD Heart: S1,S2 no M/R/G Lungs: decreased in bases otherwise CTAB Abdomen: Active BS Extremities: L BKA No edema RLE, wounds mostly healed over Dialysis Access: R AVF +  Outpt HD:MWF  5h 400/800 97.5kg 2/2 bath Hep none   RUE AVF - Na thio 25 gm tid - venofer 50 mg /wk   Assessment/ Plan: 1. PNA - LLL by CT w/ effusion.Repeat CXR Increased dense left lower lobe pneumonia or atelectasis. Getting IV abx. Had 1 L thoracentesis on left yest 4/21.  Night sweats now starting to ease off per pt. Going home today.  2. ESRD - on HD MWF.HD today.  3. HTN/ vol - BP's okay, no vol ^ on exam 4. Calciphylaxis - good recovery 5. L BKA 6. Depression - cont meds 7. 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 Cont binder, not on vdra or sensipar     Rob Bedie Dominey 11/10/2019, 3:06 PM   Recent Labs  Lab 11/08/19 0251 11/10/19 1200  K 4.0 5.2*  BUN 31* 53*  CREATININE 7.23* 8.29*  CALCIUM 8.1* 8.3*  PHOS 4.3 3.9  HGB 10.9* 10.4*   Inpatient medications: . amLODipine  5 mg Oral QHS  . atorvastatin  40 mg Oral QHS  . buPROPion  300 mg Oral QHS  . Chlorhexidine Gluconate Cloth  6 each Topical Q0600  . hydrocortisone cream   Topical BID  . insulin aspart  0-9 Units Subcutaneous TID WC  . pantoprazole  40 mg Oral QHS  . sucroferric oxyhydroxide  1,000 mg Oral TID WC  . tamsulosin  0.4 mg Oral QHS   . ceFEPime (MAXIPIME) IV 2 g (11/08/19 1752)   albuterol, ipratropium-albuterol, loperamide, melatonin, oxyCODONE, phenol,  traMADol

## 2019-11-11 ENCOUNTER — Telehealth: Payer: Self-pay | Admitting: Nurse Practitioner

## 2019-11-11 DIAGNOSIS — N186 End stage renal disease: Secondary | ICD-10-CM | POA: Diagnosis not present

## 2019-11-11 DIAGNOSIS — J189 Pneumonia, unspecified organism: Secondary | ICD-10-CM | POA: Diagnosis not present

## 2019-11-11 DIAGNOSIS — Z992 Dependence on renal dialysis: Secondary | ICD-10-CM | POA: Diagnosis not present

## 2019-11-11 DIAGNOSIS — E1129 Type 2 diabetes mellitus with other diabetic kidney complication: Secondary | ICD-10-CM | POA: Diagnosis not present

## 2019-11-11 LAB — CULTURE, BLOOD (ROUTINE X 2)
Culture: NO GROWTH
Culture: NO GROWTH
Special Requests: ADEQUATE
Special Requests: ADEQUATE

## 2019-11-11 NOTE — Telephone Encounter (Signed)
Transition of care contact from inpatient facility  Date of discharge: 11/10/2019 Date of contact 11/11/2019 Method: Phone Spoke to: Patient  Patient contacted to discuss transition of care from recent inpatient hospitalization. Patient was admitted to [facility] from 04/18-04/23/2021  with discharge diagnosis of Left lower lobe pneumonia, L pleural effusion.   Medication changes were reviewed.  Patient will follow up with his/her outpatient HD unit on: 11/13/2019

## 2019-11-13 DIAGNOSIS — E875 Hyperkalemia: Secondary | ICD-10-CM | POA: Diagnosis not present

## 2019-11-13 DIAGNOSIS — Z992 Dependence on renal dialysis: Secondary | ICD-10-CM | POA: Diagnosis not present

## 2019-11-13 DIAGNOSIS — E1121 Type 2 diabetes mellitus with diabetic nephropathy: Secondary | ICD-10-CM | POA: Diagnosis not present

## 2019-11-13 DIAGNOSIS — R52 Pain, unspecified: Secondary | ICD-10-CM | POA: Diagnosis not present

## 2019-11-13 DIAGNOSIS — N186 End stage renal disease: Secondary | ICD-10-CM | POA: Diagnosis not present

## 2019-11-13 DIAGNOSIS — N2581 Secondary hyperparathyroidism of renal origin: Secondary | ICD-10-CM | POA: Diagnosis not present

## 2019-11-13 DIAGNOSIS — D509 Iron deficiency anemia, unspecified: Secondary | ICD-10-CM | POA: Diagnosis not present

## 2019-11-13 NOTE — Care Management Important Message (Signed)
Important Message  Patient Details  Name: Michael Riggs MRN: [id number] Date of Birth: 26-Apr-1971   Medicare Important Message Given:  Yes  Patient left prior to IM delivery.  IM mailed to the patient address.   Sally Menard 11/13/2019, 3:30 PM

## 2019-11-15 DIAGNOSIS — N2581 Secondary hyperparathyroidism of renal origin: Secondary | ICD-10-CM | POA: Diagnosis not present

## 2019-11-15 DIAGNOSIS — Z992 Dependence on renal dialysis: Secondary | ICD-10-CM | POA: Diagnosis not present

## 2019-11-15 DIAGNOSIS — E1121 Type 2 diabetes mellitus with diabetic nephropathy: Secondary | ICD-10-CM | POA: Diagnosis not present

## 2019-11-15 DIAGNOSIS — N186 End stage renal disease: Secondary | ICD-10-CM | POA: Diagnosis not present

## 2019-11-15 DIAGNOSIS — R52 Pain, unspecified: Secondary | ICD-10-CM | POA: Diagnosis not present

## 2019-11-15 DIAGNOSIS — E875 Hyperkalemia: Secondary | ICD-10-CM | POA: Diagnosis not present

## 2019-11-15 DIAGNOSIS — D509 Iron deficiency anemia, unspecified: Secondary | ICD-10-CM | POA: Diagnosis not present

## 2019-11-17 DIAGNOSIS — N186 End stage renal disease: Secondary | ICD-10-CM | POA: Diagnosis not present

## 2019-11-17 DIAGNOSIS — D509 Iron deficiency anemia, unspecified: Secondary | ICD-10-CM | POA: Diagnosis not present

## 2019-11-17 DIAGNOSIS — E1121 Type 2 diabetes mellitus with diabetic nephropathy: Secondary | ICD-10-CM | POA: Diagnosis not present

## 2019-11-17 DIAGNOSIS — Z992 Dependence on renal dialysis: Secondary | ICD-10-CM | POA: Diagnosis not present

## 2019-11-17 DIAGNOSIS — E1122 Type 2 diabetes mellitus with diabetic chronic kidney disease: Secondary | ICD-10-CM | POA: Diagnosis not present

## 2019-11-17 DIAGNOSIS — N2581 Secondary hyperparathyroidism of renal origin: Secondary | ICD-10-CM | POA: Diagnosis not present

## 2019-11-17 DIAGNOSIS — E875 Hyperkalemia: Secondary | ICD-10-CM | POA: Diagnosis not present

## 2019-11-17 DIAGNOSIS — R52 Pain, unspecified: Secondary | ICD-10-CM | POA: Diagnosis not present

## 2019-11-18 DIAGNOSIS — G8929 Other chronic pain: Secondary | ICD-10-CM | POA: Diagnosis not present

## 2019-11-18 DIAGNOSIS — Z89512 Acquired absence of left leg below knee: Secondary | ICD-10-CM | POA: Diagnosis not present

## 2019-11-18 DIAGNOSIS — R269 Unspecified abnormalities of gait and mobility: Secondary | ICD-10-CM | POA: Diagnosis not present

## 2019-11-18 DIAGNOSIS — R262 Difficulty in walking, not elsewhere classified: Secondary | ICD-10-CM | POA: Diagnosis not present

## 2019-11-18 DIAGNOSIS — M6281 Muscle weakness (generalized): Secondary | ICD-10-CM | POA: Diagnosis not present

## 2019-11-20 DIAGNOSIS — E875 Hyperkalemia: Secondary | ICD-10-CM | POA: Diagnosis not present

## 2019-11-20 DIAGNOSIS — N2581 Secondary hyperparathyroidism of renal origin: Secondary | ICD-10-CM | POA: Diagnosis not present

## 2019-11-20 DIAGNOSIS — D631 Anemia in chronic kidney disease: Secondary | ICD-10-CM | POA: Diagnosis not present

## 2019-11-20 DIAGNOSIS — N186 End stage renal disease: Secondary | ICD-10-CM | POA: Diagnosis not present

## 2019-11-20 DIAGNOSIS — Z23 Encounter for immunization: Secondary | ICD-10-CM | POA: Diagnosis not present

## 2019-11-20 DIAGNOSIS — E1121 Type 2 diabetes mellitus with diabetic nephropathy: Secondary | ICD-10-CM | POA: Diagnosis not present

## 2019-11-20 DIAGNOSIS — R52 Pain, unspecified: Secondary | ICD-10-CM | POA: Diagnosis not present

## 2019-11-20 DIAGNOSIS — Z992 Dependence on renal dialysis: Secondary | ICD-10-CM | POA: Diagnosis not present

## 2019-11-20 DIAGNOSIS — D509 Iron deficiency anemia, unspecified: Secondary | ICD-10-CM | POA: Diagnosis not present

## 2019-11-22 DIAGNOSIS — Z992 Dependence on renal dialysis: Secondary | ICD-10-CM | POA: Diagnosis not present

## 2019-11-22 DIAGNOSIS — E875 Hyperkalemia: Secondary | ICD-10-CM | POA: Diagnosis not present

## 2019-11-22 DIAGNOSIS — R52 Pain, unspecified: Secondary | ICD-10-CM | POA: Diagnosis not present

## 2019-11-22 DIAGNOSIS — E1121 Type 2 diabetes mellitus with diabetic nephropathy: Secondary | ICD-10-CM | POA: Diagnosis not present

## 2019-11-22 DIAGNOSIS — D631 Anemia in chronic kidney disease: Secondary | ICD-10-CM | POA: Diagnosis not present

## 2019-11-22 DIAGNOSIS — N186 End stage renal disease: Secondary | ICD-10-CM | POA: Diagnosis not present

## 2019-11-22 DIAGNOSIS — Z23 Encounter for immunization: Secondary | ICD-10-CM | POA: Diagnosis not present

## 2019-11-22 DIAGNOSIS — N2581 Secondary hyperparathyroidism of renal origin: Secondary | ICD-10-CM | POA: Diagnosis not present

## 2019-11-22 DIAGNOSIS — D509 Iron deficiency anemia, unspecified: Secondary | ICD-10-CM | POA: Diagnosis not present

## 2019-11-24 DIAGNOSIS — E1121 Type 2 diabetes mellitus with diabetic nephropathy: Secondary | ICD-10-CM | POA: Diagnosis not present

## 2019-11-24 DIAGNOSIS — Z992 Dependence on renal dialysis: Secondary | ICD-10-CM | POA: Diagnosis not present

## 2019-11-24 DIAGNOSIS — M159 Polyosteoarthritis, unspecified: Secondary | ICD-10-CM | POA: Diagnosis not present

## 2019-11-24 DIAGNOSIS — I951 Orthostatic hypotension: Secondary | ICD-10-CM | POA: Diagnosis not present

## 2019-11-24 DIAGNOSIS — R52 Pain, unspecified: Secondary | ICD-10-CM | POA: Diagnosis not present

## 2019-11-24 DIAGNOSIS — N186 End stage renal disease: Secondary | ICD-10-CM | POA: Diagnosis not present

## 2019-11-24 DIAGNOSIS — E875 Hyperkalemia: Secondary | ICD-10-CM | POA: Diagnosis not present

## 2019-11-24 DIAGNOSIS — N2581 Secondary hyperparathyroidism of renal origin: Secondary | ICD-10-CM | POA: Diagnosis not present

## 2019-11-24 DIAGNOSIS — E118 Type 2 diabetes mellitus with unspecified complications: Secondary | ICD-10-CM | POA: Diagnosis not present

## 2019-11-24 DIAGNOSIS — D631 Anemia in chronic kidney disease: Secondary | ICD-10-CM | POA: Diagnosis not present

## 2019-11-24 DIAGNOSIS — Z23 Encounter for immunization: Secondary | ICD-10-CM | POA: Diagnosis not present

## 2019-11-24 DIAGNOSIS — D509 Iron deficiency anemia, unspecified: Secondary | ICD-10-CM | POA: Diagnosis not present

## 2019-11-29 DIAGNOSIS — R52 Pain, unspecified: Secondary | ICD-10-CM | POA: Diagnosis not present

## 2019-11-29 DIAGNOSIS — E1121 Type 2 diabetes mellitus with diabetic nephropathy: Secondary | ICD-10-CM | POA: Diagnosis not present

## 2019-11-29 DIAGNOSIS — D509 Iron deficiency anemia, unspecified: Secondary | ICD-10-CM | POA: Diagnosis not present

## 2019-11-29 DIAGNOSIS — N186 End stage renal disease: Secondary | ICD-10-CM | POA: Diagnosis not present

## 2019-11-29 DIAGNOSIS — N2581 Secondary hyperparathyroidism of renal origin: Secondary | ICD-10-CM | POA: Diagnosis not present

## 2019-11-29 DIAGNOSIS — Z23 Encounter for immunization: Secondary | ICD-10-CM | POA: Diagnosis not present

## 2019-11-29 DIAGNOSIS — D631 Anemia in chronic kidney disease: Secondary | ICD-10-CM | POA: Diagnosis not present

## 2019-11-29 DIAGNOSIS — Z992 Dependence on renal dialysis: Secondary | ICD-10-CM | POA: Diagnosis not present

## 2019-11-29 DIAGNOSIS — E875 Hyperkalemia: Secondary | ICD-10-CM | POA: Diagnosis not present

## 2019-12-01 DIAGNOSIS — D631 Anemia in chronic kidney disease: Secondary | ICD-10-CM | POA: Diagnosis not present

## 2019-12-01 DIAGNOSIS — R52 Pain, unspecified: Secondary | ICD-10-CM | POA: Diagnosis not present

## 2019-12-01 DIAGNOSIS — E875 Hyperkalemia: Secondary | ICD-10-CM | POA: Diagnosis not present

## 2019-12-01 DIAGNOSIS — N2581 Secondary hyperparathyroidism of renal origin: Secondary | ICD-10-CM | POA: Diagnosis not present

## 2019-12-01 DIAGNOSIS — N186 End stage renal disease: Secondary | ICD-10-CM | POA: Diagnosis not present

## 2019-12-01 DIAGNOSIS — E1121 Type 2 diabetes mellitus with diabetic nephropathy: Secondary | ICD-10-CM | POA: Diagnosis not present

## 2019-12-01 DIAGNOSIS — Z23 Encounter for immunization: Secondary | ICD-10-CM | POA: Diagnosis not present

## 2019-12-01 DIAGNOSIS — Z992 Dependence on renal dialysis: Secondary | ICD-10-CM | POA: Diagnosis not present

## 2019-12-01 DIAGNOSIS — D509 Iron deficiency anemia, unspecified: Secondary | ICD-10-CM | POA: Diagnosis not present

## 2019-12-04 DIAGNOSIS — Z23 Encounter for immunization: Secondary | ICD-10-CM | POA: Diagnosis not present

## 2019-12-04 DIAGNOSIS — E875 Hyperkalemia: Secondary | ICD-10-CM | POA: Diagnosis not present

## 2019-12-04 DIAGNOSIS — N2581 Secondary hyperparathyroidism of renal origin: Secondary | ICD-10-CM | POA: Diagnosis not present

## 2019-12-04 DIAGNOSIS — D509 Iron deficiency anemia, unspecified: Secondary | ICD-10-CM | POA: Diagnosis not present

## 2019-12-04 DIAGNOSIS — Z992 Dependence on renal dialysis: Secondary | ICD-10-CM | POA: Diagnosis not present

## 2019-12-04 DIAGNOSIS — E1121 Type 2 diabetes mellitus with diabetic nephropathy: Secondary | ICD-10-CM | POA: Diagnosis not present

## 2019-12-04 DIAGNOSIS — D631 Anemia in chronic kidney disease: Secondary | ICD-10-CM | POA: Diagnosis not present

## 2019-12-04 DIAGNOSIS — R52 Pain, unspecified: Secondary | ICD-10-CM | POA: Diagnosis not present

## 2019-12-04 DIAGNOSIS — N186 End stage renal disease: Secondary | ICD-10-CM | POA: Diagnosis not present

## 2019-12-05 DIAGNOSIS — N2581 Secondary hyperparathyroidism of renal origin: Secondary | ICD-10-CM | POA: Diagnosis not present

## 2019-12-05 DIAGNOSIS — N186 End stage renal disease: Secondary | ICD-10-CM | POA: Diagnosis not present

## 2019-12-05 DIAGNOSIS — Z992 Dependence on renal dialysis: Secondary | ICD-10-CM | POA: Diagnosis not present

## 2019-12-05 DIAGNOSIS — E8770 Fluid overload, unspecified: Secondary | ICD-10-CM | POA: Diagnosis not present

## 2019-12-06 DIAGNOSIS — R52 Pain, unspecified: Secondary | ICD-10-CM | POA: Diagnosis not present

## 2019-12-06 DIAGNOSIS — E1121 Type 2 diabetes mellitus with diabetic nephropathy: Secondary | ICD-10-CM | POA: Diagnosis not present

## 2019-12-06 DIAGNOSIS — Z23 Encounter for immunization: Secondary | ICD-10-CM | POA: Diagnosis not present

## 2019-12-06 DIAGNOSIS — Z992 Dependence on renal dialysis: Secondary | ICD-10-CM | POA: Diagnosis not present

## 2019-12-06 DIAGNOSIS — E875 Hyperkalemia: Secondary | ICD-10-CM | POA: Diagnosis not present

## 2019-12-06 DIAGNOSIS — N2581 Secondary hyperparathyroidism of renal origin: Secondary | ICD-10-CM | POA: Diagnosis not present

## 2019-12-06 DIAGNOSIS — D631 Anemia in chronic kidney disease: Secondary | ICD-10-CM | POA: Diagnosis not present

## 2019-12-06 DIAGNOSIS — D509 Iron deficiency anemia, unspecified: Secondary | ICD-10-CM | POA: Diagnosis not present

## 2019-12-06 DIAGNOSIS — N186 End stage renal disease: Secondary | ICD-10-CM | POA: Diagnosis not present

## 2019-12-08 DIAGNOSIS — R52 Pain, unspecified: Secondary | ICD-10-CM | POA: Diagnosis not present

## 2019-12-08 DIAGNOSIS — G894 Chronic pain syndrome: Secondary | ICD-10-CM | POA: Diagnosis not present

## 2019-12-08 DIAGNOSIS — N2581 Secondary hyperparathyroidism of renal origin: Secondary | ICD-10-CM | POA: Diagnosis not present

## 2019-12-08 DIAGNOSIS — E875 Hyperkalemia: Secondary | ICD-10-CM | POA: Diagnosis not present

## 2019-12-08 DIAGNOSIS — Z23 Encounter for immunization: Secondary | ICD-10-CM | POA: Diagnosis not present

## 2019-12-08 DIAGNOSIS — E1121 Type 2 diabetes mellitus with diabetic nephropathy: Secondary | ICD-10-CM | POA: Diagnosis not present

## 2019-12-08 DIAGNOSIS — Z992 Dependence on renal dialysis: Secondary | ICD-10-CM | POA: Diagnosis not present

## 2019-12-08 DIAGNOSIS — E118 Type 2 diabetes mellitus with unspecified complications: Secondary | ICD-10-CM | POA: Diagnosis not present

## 2019-12-08 DIAGNOSIS — D631 Anemia in chronic kidney disease: Secondary | ICD-10-CM | POA: Diagnosis not present

## 2019-12-08 DIAGNOSIS — N186 End stage renal disease: Secondary | ICD-10-CM | POA: Diagnosis not present

## 2019-12-08 DIAGNOSIS — D509 Iron deficiency anemia, unspecified: Secondary | ICD-10-CM | POA: Diagnosis not present

## 2019-12-11 DIAGNOSIS — N186 End stage renal disease: Secondary | ICD-10-CM | POA: Diagnosis not present

## 2019-12-11 DIAGNOSIS — D509 Iron deficiency anemia, unspecified: Secondary | ICD-10-CM | POA: Diagnosis not present

## 2019-12-11 DIAGNOSIS — Z23 Encounter for immunization: Secondary | ICD-10-CM | POA: Diagnosis not present

## 2019-12-11 DIAGNOSIS — E1121 Type 2 diabetes mellitus with diabetic nephropathy: Secondary | ICD-10-CM | POA: Diagnosis not present

## 2019-12-11 DIAGNOSIS — E875 Hyperkalemia: Secondary | ICD-10-CM | POA: Diagnosis not present

## 2019-12-11 DIAGNOSIS — N2581 Secondary hyperparathyroidism of renal origin: Secondary | ICD-10-CM | POA: Diagnosis not present

## 2019-12-11 DIAGNOSIS — D631 Anemia in chronic kidney disease: Secondary | ICD-10-CM | POA: Diagnosis not present

## 2019-12-11 DIAGNOSIS — R52 Pain, unspecified: Secondary | ICD-10-CM | POA: Diagnosis not present

## 2019-12-11 DIAGNOSIS — Z992 Dependence on renal dialysis: Secondary | ICD-10-CM | POA: Diagnosis not present

## 2019-12-13 DIAGNOSIS — D509 Iron deficiency anemia, unspecified: Secondary | ICD-10-CM | POA: Diagnosis not present

## 2019-12-13 DIAGNOSIS — Z23 Encounter for immunization: Secondary | ICD-10-CM | POA: Diagnosis not present

## 2019-12-13 DIAGNOSIS — E1121 Type 2 diabetes mellitus with diabetic nephropathy: Secondary | ICD-10-CM | POA: Diagnosis not present

## 2019-12-13 DIAGNOSIS — D631 Anemia in chronic kidney disease: Secondary | ICD-10-CM | POA: Diagnosis not present

## 2019-12-13 DIAGNOSIS — N186 End stage renal disease: Secondary | ICD-10-CM | POA: Diagnosis not present

## 2019-12-13 DIAGNOSIS — Z992 Dependence on renal dialysis: Secondary | ICD-10-CM | POA: Diagnosis not present

## 2019-12-13 DIAGNOSIS — N2581 Secondary hyperparathyroidism of renal origin: Secondary | ICD-10-CM | POA: Diagnosis not present

## 2019-12-13 DIAGNOSIS — E875 Hyperkalemia: Secondary | ICD-10-CM | POA: Diagnosis not present

## 2019-12-13 DIAGNOSIS — R52 Pain, unspecified: Secondary | ICD-10-CM | POA: Diagnosis not present

## 2019-12-14 DIAGNOSIS — E118 Type 2 diabetes mellitus with unspecified complications: Secondary | ICD-10-CM | POA: Diagnosis not present

## 2019-12-14 DIAGNOSIS — G4733 Obstructive sleep apnea (adult) (pediatric): Secondary | ICD-10-CM | POA: Diagnosis not present

## 2019-12-14 DIAGNOSIS — I10 Essential (primary) hypertension: Secondary | ICD-10-CM | POA: Diagnosis not present

## 2019-12-14 DIAGNOSIS — R110 Nausea: Secondary | ICD-10-CM | POA: Diagnosis not present

## 2019-12-15 DIAGNOSIS — E1121 Type 2 diabetes mellitus with diabetic nephropathy: Secondary | ICD-10-CM | POA: Diagnosis not present

## 2019-12-15 DIAGNOSIS — Z992 Dependence on renal dialysis: Secondary | ICD-10-CM | POA: Diagnosis not present

## 2019-12-15 DIAGNOSIS — D509 Iron deficiency anemia, unspecified: Secondary | ICD-10-CM | POA: Diagnosis not present

## 2019-12-15 DIAGNOSIS — D631 Anemia in chronic kidney disease: Secondary | ICD-10-CM | POA: Diagnosis not present

## 2019-12-15 DIAGNOSIS — E875 Hyperkalemia: Secondary | ICD-10-CM | POA: Diagnosis not present

## 2019-12-15 DIAGNOSIS — Z23 Encounter for immunization: Secondary | ICD-10-CM | POA: Diagnosis not present

## 2019-12-15 DIAGNOSIS — N186 End stage renal disease: Secondary | ICD-10-CM | POA: Diagnosis not present

## 2019-12-15 DIAGNOSIS — N2581 Secondary hyperparathyroidism of renal origin: Secondary | ICD-10-CM | POA: Diagnosis not present

## 2019-12-15 DIAGNOSIS — R52 Pain, unspecified: Secondary | ICD-10-CM | POA: Diagnosis not present

## 2019-12-18 DIAGNOSIS — N186 End stage renal disease: Secondary | ICD-10-CM | POA: Diagnosis not present

## 2019-12-18 DIAGNOSIS — D631 Anemia in chronic kidney disease: Secondary | ICD-10-CM | POA: Diagnosis not present

## 2019-12-18 DIAGNOSIS — D509 Iron deficiency anemia, unspecified: Secondary | ICD-10-CM | POA: Diagnosis not present

## 2019-12-18 DIAGNOSIS — E1122 Type 2 diabetes mellitus with diabetic chronic kidney disease: Secondary | ICD-10-CM | POA: Diagnosis not present

## 2019-12-18 DIAGNOSIS — N2581 Secondary hyperparathyroidism of renal origin: Secondary | ICD-10-CM | POA: Diagnosis not present

## 2019-12-18 DIAGNOSIS — E875 Hyperkalemia: Secondary | ICD-10-CM | POA: Diagnosis not present

## 2019-12-18 DIAGNOSIS — Z992 Dependence on renal dialysis: Secondary | ICD-10-CM | POA: Diagnosis not present

## 2019-12-18 DIAGNOSIS — Z23 Encounter for immunization: Secondary | ICD-10-CM | POA: Diagnosis not present

## 2019-12-18 DIAGNOSIS — R52 Pain, unspecified: Secondary | ICD-10-CM | POA: Diagnosis not present

## 2019-12-18 DIAGNOSIS — E1121 Type 2 diabetes mellitus with diabetic nephropathy: Secondary | ICD-10-CM | POA: Diagnosis not present

## 2019-12-19 DIAGNOSIS — R262 Difficulty in walking, not elsewhere classified: Secondary | ICD-10-CM | POA: Diagnosis not present

## 2019-12-19 DIAGNOSIS — M6281 Muscle weakness (generalized): Secondary | ICD-10-CM | POA: Diagnosis not present

## 2019-12-19 DIAGNOSIS — Z89512 Acquired absence of left leg below knee: Secondary | ICD-10-CM | POA: Diagnosis not present

## 2019-12-19 DIAGNOSIS — G8929 Other chronic pain: Secondary | ICD-10-CM | POA: Diagnosis not present

## 2019-12-19 DIAGNOSIS — R269 Unspecified abnormalities of gait and mobility: Secondary | ICD-10-CM | POA: Diagnosis not present

## 2019-12-20 DIAGNOSIS — N186 End stage renal disease: Secondary | ICD-10-CM | POA: Diagnosis not present

## 2019-12-20 DIAGNOSIS — D631 Anemia in chronic kidney disease: Secondary | ICD-10-CM | POA: Diagnosis not present

## 2019-12-20 DIAGNOSIS — D509 Iron deficiency anemia, unspecified: Secondary | ICD-10-CM | POA: Diagnosis not present

## 2019-12-20 DIAGNOSIS — Z992 Dependence on renal dialysis: Secondary | ICD-10-CM | POA: Diagnosis not present

## 2019-12-20 DIAGNOSIS — N2581 Secondary hyperparathyroidism of renal origin: Secondary | ICD-10-CM | POA: Diagnosis not present

## 2019-12-20 DIAGNOSIS — E875 Hyperkalemia: Secondary | ICD-10-CM | POA: Diagnosis not present

## 2019-12-20 DIAGNOSIS — E1121 Type 2 diabetes mellitus with diabetic nephropathy: Secondary | ICD-10-CM | POA: Diagnosis not present

## 2019-12-21 DIAGNOSIS — I739 Peripheral vascular disease, unspecified: Secondary | ICD-10-CM | POA: Diagnosis not present

## 2019-12-21 DIAGNOSIS — G894 Chronic pain syndrome: Secondary | ICD-10-CM | POA: Diagnosis not present

## 2019-12-21 DIAGNOSIS — R05 Cough: Secondary | ICD-10-CM | POA: Diagnosis not present

## 2019-12-22 DIAGNOSIS — D631 Anemia in chronic kidney disease: Secondary | ICD-10-CM | POA: Diagnosis not present

## 2019-12-22 DIAGNOSIS — E875 Hyperkalemia: Secondary | ICD-10-CM | POA: Diagnosis not present

## 2019-12-22 DIAGNOSIS — E1121 Type 2 diabetes mellitus with diabetic nephropathy: Secondary | ICD-10-CM | POA: Diagnosis not present

## 2019-12-22 DIAGNOSIS — N186 End stage renal disease: Secondary | ICD-10-CM | POA: Diagnosis not present

## 2019-12-22 DIAGNOSIS — D509 Iron deficiency anemia, unspecified: Secondary | ICD-10-CM | POA: Diagnosis not present

## 2019-12-22 DIAGNOSIS — N2581 Secondary hyperparathyroidism of renal origin: Secondary | ICD-10-CM | POA: Diagnosis not present

## 2019-12-22 DIAGNOSIS — Z992 Dependence on renal dialysis: Secondary | ICD-10-CM | POA: Diagnosis not present

## 2019-12-25 DIAGNOSIS — D631 Anemia in chronic kidney disease: Secondary | ICD-10-CM | POA: Diagnosis not present

## 2019-12-25 DIAGNOSIS — N186 End stage renal disease: Secondary | ICD-10-CM | POA: Diagnosis not present

## 2019-12-25 DIAGNOSIS — N2581 Secondary hyperparathyroidism of renal origin: Secondary | ICD-10-CM | POA: Diagnosis not present

## 2019-12-25 DIAGNOSIS — E1121 Type 2 diabetes mellitus with diabetic nephropathy: Secondary | ICD-10-CM | POA: Diagnosis not present

## 2019-12-25 DIAGNOSIS — E875 Hyperkalemia: Secondary | ICD-10-CM | POA: Diagnosis not present

## 2019-12-25 DIAGNOSIS — D509 Iron deficiency anemia, unspecified: Secondary | ICD-10-CM | POA: Diagnosis not present

## 2019-12-25 DIAGNOSIS — Z992 Dependence on renal dialysis: Secondary | ICD-10-CM | POA: Diagnosis not present

## 2019-12-27 DIAGNOSIS — D509 Iron deficiency anemia, unspecified: Secondary | ICD-10-CM | POA: Diagnosis not present

## 2019-12-27 DIAGNOSIS — E875 Hyperkalemia: Secondary | ICD-10-CM | POA: Diagnosis not present

## 2019-12-27 DIAGNOSIS — Z992 Dependence on renal dialysis: Secondary | ICD-10-CM | POA: Diagnosis not present

## 2019-12-27 DIAGNOSIS — E1121 Type 2 diabetes mellitus with diabetic nephropathy: Secondary | ICD-10-CM | POA: Diagnosis not present

## 2019-12-27 DIAGNOSIS — N2581 Secondary hyperparathyroidism of renal origin: Secondary | ICD-10-CM | POA: Diagnosis not present

## 2019-12-27 DIAGNOSIS — D631 Anemia in chronic kidney disease: Secondary | ICD-10-CM | POA: Diagnosis not present

## 2019-12-27 DIAGNOSIS — N186 End stage renal disease: Secondary | ICD-10-CM | POA: Diagnosis not present

## 2019-12-28 ENCOUNTER — Other Ambulatory Visit: Payer: Self-pay | Admitting: *Deleted

## 2019-12-28 NOTE — Patient Outreach (Signed)
[facility]) Care Management  12/28/2019  Koltan Portocarrero August 12, 1970 [id number]   Telephone Screen  Referral Date:  12/26/2019 Referral Source:  EMMI Prevent Reason for Referral:  EMMI Prevent Screening/Join Care Management Insurance:  [employer] Medicare   Outreach Attempt:  Outreach attempt #1 to patient for telephone screening. No answer. RN Health Coach left HIPAA compliant voicemail message along with contact information.  Plan:  RN Health Coach will send unsuccessful outreach letter to patient.  RN Health Coach will make another outreach attempt to patient within 3-4 business days if no return call back from patient.   [place] [phone] [email or web address]

## 2019-12-29 DIAGNOSIS — E875 Hyperkalemia: Secondary | ICD-10-CM | POA: Diagnosis not present

## 2019-12-29 DIAGNOSIS — D631 Anemia in chronic kidney disease: Secondary | ICD-10-CM | POA: Diagnosis not present

## 2019-12-29 DIAGNOSIS — Z992 Dependence on renal dialysis: Secondary | ICD-10-CM | POA: Diagnosis not present

## 2019-12-29 DIAGNOSIS — N186 End stage renal disease: Secondary | ICD-10-CM | POA: Diagnosis not present

## 2019-12-29 DIAGNOSIS — E1121 Type 2 diabetes mellitus with diabetic nephropathy: Secondary | ICD-10-CM | POA: Diagnosis not present

## 2019-12-29 DIAGNOSIS — N2581 Secondary hyperparathyroidism of renal origin: Secondary | ICD-10-CM | POA: Diagnosis not present

## 2019-12-29 DIAGNOSIS — D509 Iron deficiency anemia, unspecified: Secondary | ICD-10-CM | POA: Diagnosis not present

## 2020-01-01 DIAGNOSIS — E1121 Type 2 diabetes mellitus with diabetic nephropathy: Secondary | ICD-10-CM | POA: Diagnosis not present

## 2020-01-01 DIAGNOSIS — D631 Anemia in chronic kidney disease: Secondary | ICD-10-CM | POA: Diagnosis not present

## 2020-01-01 DIAGNOSIS — Z992 Dependence on renal dialysis: Secondary | ICD-10-CM | POA: Diagnosis not present

## 2020-01-01 DIAGNOSIS — N186 End stage renal disease: Secondary | ICD-10-CM | POA: Diagnosis not present

## 2020-01-01 DIAGNOSIS — D509 Iron deficiency anemia, unspecified: Secondary | ICD-10-CM | POA: Diagnosis not present

## 2020-01-01 DIAGNOSIS — E875 Hyperkalemia: Secondary | ICD-10-CM | POA: Diagnosis not present

## 2020-01-01 DIAGNOSIS — N2581 Secondary hyperparathyroidism of renal origin: Secondary | ICD-10-CM | POA: Diagnosis not present

## 2020-01-02 ENCOUNTER — Other Ambulatory Visit: Payer: Self-pay | Admitting: *Deleted

## 2020-01-02 NOTE — Patient Outreach (Signed)
[name]) Care Management  01/02/2020  Michael Riggs 08-Apr-1971 [id number]   Telephone Screen  Referral Date:  12/26/2019 Referral Source:  EMMI Prevent Reason for Referral:  EMMI Prevent Screening/Join Care Management Insurance:  [employer] Medicare   Outreach Attempt:  Successful telephone outreach to patient for telephone screening.  HIPAA verified with patient.  Patient reporting lives at home with mother and brother.  States his brother moved in to assist with the care of his mother whom has had multiple falls.  Patient reporting history of Diabetes, amputation, end stage renal disease with hemodialysis on Mondays, Wednesdays, and Fridays and tries to arrange appointments to medical providers on Tuesdays and Thursdays.  Feels his chronic conditions are well managed with last Hgb A1C of 5.3.  Does state he is having anger issues and has been evaluated and has list of therapist he is contacting for treatment. [facility] services reviewed and discussed.  Patient declines services at this time but agrees to have [facility] mailed to his home.  Encouraged patient to contact [facility] in the future if needs arise and if he has difficulties finding therapist.  Patient stated he would.  Plan:  RN Health Coach will close case as patient declines services at this time.  RN Health Coach will send patient Successful letter with [facility] Brochure.  [place] [phone] [email or web address]

## 2020-01-03 DIAGNOSIS — N2581 Secondary hyperparathyroidism of renal origin: Secondary | ICD-10-CM | POA: Diagnosis not present

## 2020-01-03 DIAGNOSIS — Z992 Dependence on renal dialysis: Secondary | ICD-10-CM | POA: Diagnosis not present

## 2020-01-03 DIAGNOSIS — J189 Pneumonia, unspecified organism: Secondary | ICD-10-CM | POA: Diagnosis not present

## 2020-01-03 DIAGNOSIS — E1121 Type 2 diabetes mellitus with diabetic nephropathy: Secondary | ICD-10-CM | POA: Diagnosis not present

## 2020-01-03 DIAGNOSIS — D631 Anemia in chronic kidney disease: Secondary | ICD-10-CM | POA: Diagnosis not present

## 2020-01-03 DIAGNOSIS — N186 End stage renal disease: Secondary | ICD-10-CM | POA: Diagnosis not present

## 2020-01-03 DIAGNOSIS — J90 Pleural effusion, not elsewhere classified: Secondary | ICD-10-CM | POA: Diagnosis not present

## 2020-01-03 DIAGNOSIS — R05 Cough: Secondary | ICD-10-CM | POA: Diagnosis not present

## 2020-01-03 DIAGNOSIS — E875 Hyperkalemia: Secondary | ICD-10-CM | POA: Diagnosis not present

## 2020-01-03 DIAGNOSIS — D509 Iron deficiency anemia, unspecified: Secondary | ICD-10-CM | POA: Diagnosis not present

## 2020-01-04 DIAGNOSIS — I10 Essential (primary) hypertension: Secondary | ICD-10-CM | POA: Diagnosis not present

## 2020-01-04 DIAGNOSIS — G894 Chronic pain syndrome: Secondary | ICD-10-CM | POA: Diagnosis not present

## 2020-01-04 DIAGNOSIS — M159 Polyosteoarthritis, unspecified: Secondary | ICD-10-CM | POA: Diagnosis not present

## 2020-01-05 DIAGNOSIS — N2581 Secondary hyperparathyroidism of renal origin: Secondary | ICD-10-CM | POA: Diagnosis not present

## 2020-01-05 DIAGNOSIS — E875 Hyperkalemia: Secondary | ICD-10-CM | POA: Diagnosis not present

## 2020-01-05 DIAGNOSIS — N186 End stage renal disease: Secondary | ICD-10-CM | POA: Diagnosis not present

## 2020-01-05 DIAGNOSIS — D509 Iron deficiency anemia, unspecified: Secondary | ICD-10-CM | POA: Diagnosis not present

## 2020-01-05 DIAGNOSIS — Z992 Dependence on renal dialysis: Secondary | ICD-10-CM | POA: Diagnosis not present

## 2020-01-05 DIAGNOSIS — D631 Anemia in chronic kidney disease: Secondary | ICD-10-CM | POA: Diagnosis not present

## 2020-01-05 DIAGNOSIS — E1121 Type 2 diabetes mellitus with diabetic nephropathy: Secondary | ICD-10-CM | POA: Diagnosis not present

## 2020-01-08 DIAGNOSIS — D509 Iron deficiency anemia, unspecified: Secondary | ICD-10-CM | POA: Diagnosis not present

## 2020-01-08 DIAGNOSIS — E1121 Type 2 diabetes mellitus with diabetic nephropathy: Secondary | ICD-10-CM | POA: Diagnosis not present

## 2020-01-08 DIAGNOSIS — Z992 Dependence on renal dialysis: Secondary | ICD-10-CM | POA: Diagnosis not present

## 2020-01-08 DIAGNOSIS — N2581 Secondary hyperparathyroidism of renal origin: Secondary | ICD-10-CM | POA: Diagnosis not present

## 2020-01-08 DIAGNOSIS — D631 Anemia in chronic kidney disease: Secondary | ICD-10-CM | POA: Diagnosis not present

## 2020-01-08 DIAGNOSIS — N186 End stage renal disease: Secondary | ICD-10-CM | POA: Diagnosis not present

## 2020-01-08 DIAGNOSIS — E875 Hyperkalemia: Secondary | ICD-10-CM | POA: Diagnosis not present

## 2020-01-10 DIAGNOSIS — Z992 Dependence on renal dialysis: Secondary | ICD-10-CM | POA: Diagnosis not present

## 2020-01-10 DIAGNOSIS — E1121 Type 2 diabetes mellitus with diabetic nephropathy: Secondary | ICD-10-CM | POA: Diagnosis not present

## 2020-01-10 DIAGNOSIS — D509 Iron deficiency anemia, unspecified: Secondary | ICD-10-CM | POA: Diagnosis not present

## 2020-01-10 DIAGNOSIS — N186 End stage renal disease: Secondary | ICD-10-CM | POA: Diagnosis not present

## 2020-01-10 DIAGNOSIS — N2581 Secondary hyperparathyroidism of renal origin: Secondary | ICD-10-CM | POA: Diagnosis not present

## 2020-01-10 DIAGNOSIS — D631 Anemia in chronic kidney disease: Secondary | ICD-10-CM | POA: Diagnosis not present

## 2020-01-10 DIAGNOSIS — E875 Hyperkalemia: Secondary | ICD-10-CM | POA: Diagnosis not present

## 2020-01-12 DIAGNOSIS — Z992 Dependence on renal dialysis: Secondary | ICD-10-CM | POA: Diagnosis not present

## 2020-01-12 DIAGNOSIS — N186 End stage renal disease: Secondary | ICD-10-CM | POA: Diagnosis not present

## 2020-01-12 DIAGNOSIS — E1121 Type 2 diabetes mellitus with diabetic nephropathy: Secondary | ICD-10-CM | POA: Diagnosis not present

## 2020-01-12 DIAGNOSIS — D509 Iron deficiency anemia, unspecified: Secondary | ICD-10-CM | POA: Diagnosis not present

## 2020-01-12 DIAGNOSIS — E875 Hyperkalemia: Secondary | ICD-10-CM | POA: Diagnosis not present

## 2020-01-12 DIAGNOSIS — N2581 Secondary hyperparathyroidism of renal origin: Secondary | ICD-10-CM | POA: Diagnosis not present

## 2020-01-12 DIAGNOSIS — D631 Anemia in chronic kidney disease: Secondary | ICD-10-CM | POA: Diagnosis not present

## 2020-01-15 DIAGNOSIS — N186 End stage renal disease: Secondary | ICD-10-CM | POA: Diagnosis not present

## 2020-01-15 DIAGNOSIS — D509 Iron deficiency anemia, unspecified: Secondary | ICD-10-CM | POA: Diagnosis not present

## 2020-01-15 DIAGNOSIS — E875 Hyperkalemia: Secondary | ICD-10-CM | POA: Diagnosis not present

## 2020-01-15 DIAGNOSIS — Z992 Dependence on renal dialysis: Secondary | ICD-10-CM | POA: Diagnosis not present

## 2020-01-15 DIAGNOSIS — N2581 Secondary hyperparathyroidism of renal origin: Secondary | ICD-10-CM | POA: Diagnosis not present

## 2020-01-15 DIAGNOSIS — E1121 Type 2 diabetes mellitus with diabetic nephropathy: Secondary | ICD-10-CM | POA: Diagnosis not present

## 2020-01-15 DIAGNOSIS — D631 Anemia in chronic kidney disease: Secondary | ICD-10-CM | POA: Diagnosis not present

## 2020-01-16 DIAGNOSIS — G894 Chronic pain syndrome: Secondary | ICD-10-CM | POA: Diagnosis not present

## 2020-01-16 DIAGNOSIS — M159 Polyosteoarthritis, unspecified: Secondary | ICD-10-CM | POA: Diagnosis not present

## 2020-01-16 DIAGNOSIS — I10 Essential (primary) hypertension: Secondary | ICD-10-CM | POA: Diagnosis not present

## 2020-01-17 DIAGNOSIS — D509 Iron deficiency anemia, unspecified: Secondary | ICD-10-CM | POA: Diagnosis not present

## 2020-01-17 DIAGNOSIS — Z992 Dependence on renal dialysis: Secondary | ICD-10-CM | POA: Diagnosis not present

## 2020-01-17 DIAGNOSIS — E1121 Type 2 diabetes mellitus with diabetic nephropathy: Secondary | ICD-10-CM | POA: Diagnosis not present

## 2020-01-17 DIAGNOSIS — D631 Anemia in chronic kidney disease: Secondary | ICD-10-CM | POA: Diagnosis not present

## 2020-01-17 DIAGNOSIS — N2581 Secondary hyperparathyroidism of renal origin: Secondary | ICD-10-CM | POA: Diagnosis not present

## 2020-01-17 DIAGNOSIS — E1122 Type 2 diabetes mellitus with diabetic chronic kidney disease: Secondary | ICD-10-CM | POA: Diagnosis not present

## 2020-01-17 DIAGNOSIS — N186 End stage renal disease: Secondary | ICD-10-CM | POA: Diagnosis not present

## 2020-01-17 DIAGNOSIS — E875 Hyperkalemia: Secondary | ICD-10-CM | POA: Diagnosis not present

## 2020-01-18 ENCOUNTER — Encounter: Payer: Self-pay | Admitting: Podiatry

## 2020-01-18 ENCOUNTER — Ambulatory Visit (INDEPENDENT_AMBULATORY_CARE_PROVIDER_SITE_OTHER): Payer: Medicare Other | Admitting: Podiatry

## 2020-01-18 ENCOUNTER — Ambulatory Visit (INDEPENDENT_AMBULATORY_CARE_PROVIDER_SITE_OTHER): Payer: Medicare Other

## 2020-01-18 ENCOUNTER — Other Ambulatory Visit: Payer: Self-pay

## 2020-01-18 DIAGNOSIS — Z992 Dependence on renal dialysis: Secondary | ICD-10-CM

## 2020-01-18 DIAGNOSIS — N186 End stage renal disease: Secondary | ICD-10-CM | POA: Diagnosis not present

## 2020-01-18 DIAGNOSIS — M6281 Muscle weakness (generalized): Secondary | ICD-10-CM | POA: Diagnosis not present

## 2020-01-18 DIAGNOSIS — R262 Difficulty in walking, not elsewhere classified: Secondary | ICD-10-CM | POA: Diagnosis not present

## 2020-01-18 DIAGNOSIS — R269 Unspecified abnormalities of gait and mobility: Secondary | ICD-10-CM | POA: Diagnosis not present

## 2020-01-18 DIAGNOSIS — M79674 Pain in right toe(s): Secondary | ICD-10-CM

## 2020-01-18 DIAGNOSIS — M79675 Pain in left toe(s): Secondary | ICD-10-CM

## 2020-01-18 DIAGNOSIS — G8929 Other chronic pain: Secondary | ICD-10-CM | POA: Diagnosis not present

## 2020-01-18 DIAGNOSIS — B351 Tinea unguium: Secondary | ICD-10-CM | POA: Diagnosis not present

## 2020-01-18 DIAGNOSIS — E1151 Type 2 diabetes mellitus with diabetic peripheral angiopathy without gangrene: Secondary | ICD-10-CM | POA: Diagnosis not present

## 2020-01-18 DIAGNOSIS — Z89512 Acquired absence of left leg below knee: Secondary | ICD-10-CM | POA: Diagnosis not present

## 2020-01-18 DIAGNOSIS — S91111A Laceration without foreign body of right great toe without damage to nail, initial encounter: Secondary | ICD-10-CM | POA: Diagnosis not present

## 2020-01-18 MED ORDER — MUPIROCIN 2 % EX OINT: TOPICAL_OINTMENT | CUTANEOUS | 1 refills | Status: AC

## 2020-01-18 NOTE — Progress Notes (Signed)
Subjective: Michael Riggs presents today at [facility]. 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 He presents today with concern of right hallux. He states he stepped on a plastic fork which broke the skin on the plantar aspect of his right great toe about 2-3 days ago. He states it bled for a while, but eventually stopped. He has been applying antibiotic ointment to area once daily. He denies any redness or swelling.  He also presents with blood blister on right 2nd digit and is insisting of having it trimmed.   [name], MD is patient's PCP. Last visit was: 08/23/2019.  Past Medical History:  Diagnosis Date  . Anemia   . Anemia of chronic disease   . Anxiety   . ARF (acute renal failure) ([place]) 01/30/2019  . Calciphylaxis   . Controlled type 2 diabetes mellitus with hyperglycemia, without long-term current use of insulin ([place])   . Debility 02/09/2019  . Depression   . Diabetes mellitus type 2 in obese ([place]) 07/04/2013  . Diabetes mellitus with peripheral vascular disease (HCC)    type 2 no meds, lost 100 lbs  . Dyslipidemia   . Dyspnea    inhaler  . End stage renal disease ([place])   . ESRD (end stage renal disease) on dialysis ([place]) 01/2019   MWFS  . ESRD on dialysis ([place])   . Essential hypertension   . Fluid overload 02/04/2019  . GERD (gastroesophageal reflux disease)   . Goals of care, counseling/discussion   . Habitual alcohol use 07/04/2013  . Headache   . Hyperkalemia 01/30/2019  . Hypertension   . Hypoglycemia 01/30/2019  . Hypokalemia 01/30/2019  . Labile blood pressure   . Leukocytosis   . Lower GI bleed 05/14/2019  . Macrocytic anemia 01/30/2019  . Obesity, Class II, BMI 35-39.9, with comorbidity 07/04/2013  . Palliative care encounter   . PDR (proliferative diabetic retinopathy) ([place])  12/14/2013  . Physical deconditioning   . Pressure injury of skin 04/27/2019  . Pruritus   . Scrotal edema   . Scrotal pain   . Sleep apnea    uses CPAP  . Sleep disturbance   . Slow transit constipation   . Status post below-knee amputation of left lower extremity ([place])   . Stroke [facility])    mini -shown on CT scan  . Syphilis 01/2019  . Urinary retention   . Venous hypertension 09/22/2013  . Wound infection 03/28/2019     Patient Active Problem List   Diagnosis Date Noted  . Acute respiratory failure with hypoxia ([place]) 11/06/2019  . CAP (community acquired pneumonia) 10/11/2019  . Lower GI bleed 05/14/2019  . Pressure injury of skin 04/27/2019  . Palliative care encounter   . Goals of care, counseling/discussion   . Calciphylaxis   . Wound infection 03/28/2019  . Hypertension   . Dyslipidemia   . Diabetes mellitus with peripheral vascular disease ([place])   . Depression   . Physical deconditioning   . Pruritus   . Scrotal pain   . Labile blood pressure   . Scrotal edema   . Status post below-knee amputation of left lower extremity ([place])   . Slow transit constipation   . Leukocytosis   . Sleep disturbance   . Essential hypertension   . Anemia of chronic disease   . Controlled type 2 diabetes  mellitus with hyperglycemia, without long-term current use of insulin ([place])   . ESRD on dialysis ([place])   . Urinary retention   . Debility 02/09/2019  . Fluid overload 02/04/2019  . Hyperkalemia 01/30/2019  . ARF (acute renal failure) ([place]) 01/30/2019  . Hypokalemia 01/30/2019  . Hypoglycemia 01/30/2019  . Syphilis 01/30/2019  . Macrocytic anemia 01/30/2019  . ESRD (end stage renal disease) ([place])   . ESRD (end stage renal disease) on dialysis ([place]) 01/2019  . PDR (proliferative diabetic retinopathy) ([place]) 12/14/2013  . Venous hypertension 09/22/2013  . Diabetes mellitus type 2 in obese ([place]) 07/04/2013  . Habitual alcohol use 07/04/2013  . Obesity, Class II, BMI 35-39.9, with  comorbidity 07/04/2013    Current Outpatient Medications on File Prior to Visit  Medication Sig Dispense Refill  . acetaminophen (TYLENOL) 325 MG tablet Take 2 tablets (650 mg total) by mouth every 6 (six) hours as needed for mild pain (or Fever >/= 101).    Marland Kitchen albuterol (VENTOLIN HFA) 108 (90 Base) MCG/ACT inhaler Inhale 2 puffs into the lungs every 6 (six) hours as needed for wheezing or shortness of breath.    Marland Kitchen amLODipine (NORVASC) 5 MG tablet Take 5 mg by mouth at bedtime.     Marland Kitchen atorvastatin (LIPITOR) 40 MG tablet Take 1 tablet (40 mg total) by mouth daily. (Patient taking differently: Take 40 mg by mouth at bedtime. )    . buPROPion (WELLBUTRIN XL) 300 MG 24 hr tablet Take 300 mg by mouth at bedtime.     . collagenase (SANTYL) ointment Apply 1 application topically daily. Apply to the affected area daily plus dry dressing 90 g 3  . diclofenac Sodium (VOLTAREN) 1 % GEL Apply 2 g topically 4 (four) times daily. 100 g 0  . doxycycline (VIBRA-TABS) 100 MG tablet Take 1 tablet (100 mg total) by mouth every 12 (twelve) hours. (Patient not taking: Reported on 11/06/2019) 8 tablet 0  . Hydrocortisone (GERHARDT'S BUTT CREAM) CREA Apply 1 application topically 3 (three) times daily as needed for irritation. 1 each 0  . ipratropium-albuterol (DUONEB) 0.5-2.5 (3) MG/3ML SOLN Take 3 mLs by nebulization every 6 (six) hours as needed (For shortness of breath).    . Melatonin 5 MG TABS Take 5 mg by mouth at bedtime as needed (sleep).    Marland Kitchen oxyCODONE (OXY IR/ROXICODONE) 5 MG immediate release tablet Take 5 mg by mouth 2 (two) times daily as needed for moderate pain.     Marland Kitchen oxycodone (OXY-IR) 5 MG capsule Take 1 capsule (5 mg total) by mouth every 6 (six) hours as needed. (Patient not taking: Reported on 11/06/2019) 20 capsule 0  . pantoprazole (PROTONIX) 40 MG tablet Take 1 tablet (40 mg total) by mouth daily. (Patient taking differently: Take 40 mg by mouth at bedtime. )    . sucroferric oxyhydroxide (VELPHORO)  500 MG chewable tablet Chew 1,000 mg by mouth 3 (three) times daily with meals.    . tamsulosin (FLOMAX) 0.4 MG CAPS capsule Take 1 capsule (0.4 mg total) by mouth daily after breakfast. (Patient taking differently: Take 0.4 mg by mouth at bedtime. ) 30 capsule   . traMADol (ULTRAM) 50 MG tablet Take 1 tablet (50 mg total) by mouth every 8 (eight) hours as needed for moderate pain. 15 tablet 0   No current facility-administered medications on file prior to visit.     No Known Allergies  Objective: Liandro Thelin is a pleasant 49 y.o. Hispanic male obese in NAD. AAO  x 3.  There were no vitals filed for this visit.  Vascular Examination: Neurovascular status unchanged right lower extremity. Capillary fill time to digits <3 seconds right lower extremity. Faintly palpable DP pulse(s) right lower extremity. Nonpalpable PT pulse(s) right lower extremity. Pedal hair present. Lower extremity skin temperature gradient within normal limits.  Dermatological Examination: Pedal skin is thin shiny, atrophic right lower extremity and 1-5 right. No interdigital macerations. Toenails 1-5 right elongated, discolored, dystrophic, thickened, and crumbly with subungual debris and tenderness to dorsal palpation.   Laceration noted plantar aspect of right hallux. Superficial with red, granular base. Measures 0.5 x 0.7 cm. No erythema, no edema, no drainage.  Dried blood blister noted distal aspect of right 2nd toe. Stable. No erythema, no edema, no drainage, no flocculence.   Musculoskeletal: Lower extremity amputation(s): below knee amputation right lower extremity. Utilizing BK prosthesis on today. Weightbearing.  Neurological Examination: Protective sensation diminished with 10 gram monofilament right lower extremity. Vibratory sensation diminished right lower extremity.   Xrays 3 views right foot: Moderate arterial vessel calcifications noted in right foot up to leg. No gas in tissues Osteopenia  appreciated No bone erosion noted right hallux or right 2nd digit  Last A1c: Hemoglobin A1C Latest Ref Rng & Units 05/16/2019 01/31/2019  HGBA1C 4.8 - 5.6 % 5.3 5.0  Some recent data might be hidden    Assessment: 1. Pain due to onychomycosis of toenails of both feet   2. Toe pain, right   3. Laceration of right great toe without foreign body present or damage to nail, initial encounter   4. Diabetes mellitus with peripheral vascular disease ([place])   5. ESRD on dialysis [facility])    Plan: -Examined patient. Right 2nd digit stable. Monitor for now. -Toenails 1-5 right foot debrided in length and girth without iatrogenic bleeding with sterile nail nipper and dremel.  -Xrays taken and reviewed of right foot. -Patient to report any pedal injuries to medical professional immediately. -Laceration right hallux cleansed with wound cleanser. Triple antibiotic ointment and band-aid applied to digit. Rx for Mupirocin Ointment written today. He is to apply Mupirocin Ointmen to right great toe once daily. -To cover his toe wound empirically, ordered IV antibiotics to be given at dialysis. Spoke to Nurse Caryl Pina at [facility]. Ordered Ancef to be administered after dialysis: 2 grams Ancef during dialysis on Monday and Wednesday and 3g Ancef to be administered on Friday for one week.  -Patient to continue soft, supportive shoe gear daily. -Patient/POA to call should there be question/concern in the interim.  Return in about 1 week (around 01/25/2020) for right great toe laceration check.  Marzetta Board, DPM

## 2020-01-19 DIAGNOSIS — E08621 Diabetes mellitus due to underlying condition with foot ulcer: Secondary | ICD-10-CM | POA: Diagnosis not present

## 2020-01-19 DIAGNOSIS — N2581 Secondary hyperparathyroidism of renal origin: Secondary | ICD-10-CM | POA: Diagnosis not present

## 2020-01-19 DIAGNOSIS — N186 End stage renal disease: Secondary | ICD-10-CM | POA: Diagnosis not present

## 2020-01-19 DIAGNOSIS — Z992 Dependence on renal dialysis: Secondary | ICD-10-CM | POA: Diagnosis not present

## 2020-01-19 DIAGNOSIS — D631 Anemia in chronic kidney disease: Secondary | ICD-10-CM | POA: Diagnosis not present

## 2020-01-19 DIAGNOSIS — R52 Pain, unspecified: Secondary | ICD-10-CM | POA: Diagnosis not present

## 2020-01-19 DIAGNOSIS — D509 Iron deficiency anemia, unspecified: Secondary | ICD-10-CM | POA: Diagnosis not present

## 2020-01-19 DIAGNOSIS — E1121 Type 2 diabetes mellitus with diabetic nephropathy: Secondary | ICD-10-CM | POA: Diagnosis not present

## 2020-01-20 DIAGNOSIS — E8779 Other fluid overload: Secondary | ICD-10-CM | POA: Diagnosis not present

## 2020-01-20 DIAGNOSIS — Z992 Dependence on renal dialysis: Secondary | ICD-10-CM | POA: Diagnosis not present

## 2020-01-20 DIAGNOSIS — N186 End stage renal disease: Secondary | ICD-10-CM | POA: Diagnosis not present

## 2020-01-20 DIAGNOSIS — N2581 Secondary hyperparathyroidism of renal origin: Secondary | ICD-10-CM | POA: Diagnosis not present

## 2020-01-22 DIAGNOSIS — N186 End stage renal disease: Secondary | ICD-10-CM | POA: Diagnosis not present

## 2020-01-22 DIAGNOSIS — N2581 Secondary hyperparathyroidism of renal origin: Secondary | ICD-10-CM | POA: Diagnosis not present

## 2020-01-22 DIAGNOSIS — R52 Pain, unspecified: Secondary | ICD-10-CM | POA: Diagnosis not present

## 2020-01-22 DIAGNOSIS — Z992 Dependence on renal dialysis: Secondary | ICD-10-CM | POA: Diagnosis not present

## 2020-01-22 DIAGNOSIS — D631 Anemia in chronic kidney disease: Secondary | ICD-10-CM | POA: Diagnosis not present

## 2020-01-22 DIAGNOSIS — E08621 Diabetes mellitus due to underlying condition with foot ulcer: Secondary | ICD-10-CM | POA: Diagnosis not present

## 2020-01-22 DIAGNOSIS — E1121 Type 2 diabetes mellitus with diabetic nephropathy: Secondary | ICD-10-CM | POA: Diagnosis not present

## 2020-01-22 DIAGNOSIS — D509 Iron deficiency anemia, unspecified: Secondary | ICD-10-CM | POA: Diagnosis not present

## 2020-01-24 DIAGNOSIS — N2581 Secondary hyperparathyroidism of renal origin: Secondary | ICD-10-CM | POA: Diagnosis not present

## 2020-01-24 DIAGNOSIS — N186 End stage renal disease: Secondary | ICD-10-CM | POA: Diagnosis not present

## 2020-01-24 DIAGNOSIS — E1121 Type 2 diabetes mellitus with diabetic nephropathy: Secondary | ICD-10-CM | POA: Diagnosis not present

## 2020-01-24 DIAGNOSIS — R52 Pain, unspecified: Secondary | ICD-10-CM | POA: Diagnosis not present

## 2020-01-24 DIAGNOSIS — D509 Iron deficiency anemia, unspecified: Secondary | ICD-10-CM | POA: Diagnosis not present

## 2020-01-24 DIAGNOSIS — D631 Anemia in chronic kidney disease: Secondary | ICD-10-CM | POA: Diagnosis not present

## 2020-01-24 DIAGNOSIS — E08621 Diabetes mellitus due to underlying condition with foot ulcer: Secondary | ICD-10-CM | POA: Diagnosis not present

## 2020-01-24 DIAGNOSIS — Z992 Dependence on renal dialysis: Secondary | ICD-10-CM | POA: Diagnosis not present

## 2020-01-25 ENCOUNTER — Ambulatory Visit: Payer: Medicare Other | Admitting: Podiatry

## 2020-01-27 DIAGNOSIS — N186 End stage renal disease: Secondary | ICD-10-CM | POA: Diagnosis not present

## 2020-01-27 DIAGNOSIS — E08621 Diabetes mellitus due to underlying condition with foot ulcer: Secondary | ICD-10-CM | POA: Diagnosis not present

## 2020-01-27 DIAGNOSIS — N2581 Secondary hyperparathyroidism of renal origin: Secondary | ICD-10-CM | POA: Diagnosis not present

## 2020-01-27 DIAGNOSIS — D631 Anemia in chronic kidney disease: Secondary | ICD-10-CM | POA: Diagnosis not present

## 2020-01-27 DIAGNOSIS — D509 Iron deficiency anemia, unspecified: Secondary | ICD-10-CM | POA: Diagnosis not present

## 2020-01-27 DIAGNOSIS — E1121 Type 2 diabetes mellitus with diabetic nephropathy: Secondary | ICD-10-CM | POA: Diagnosis not present

## 2020-01-27 DIAGNOSIS — R52 Pain, unspecified: Secondary | ICD-10-CM | POA: Diagnosis not present

## 2020-01-27 DIAGNOSIS — Z992 Dependence on renal dialysis: Secondary | ICD-10-CM | POA: Diagnosis not present

## 2020-01-29 DIAGNOSIS — D509 Iron deficiency anemia, unspecified: Secondary | ICD-10-CM | POA: Diagnosis not present

## 2020-01-29 DIAGNOSIS — N186 End stage renal disease: Secondary | ICD-10-CM | POA: Diagnosis not present

## 2020-01-29 DIAGNOSIS — R52 Pain, unspecified: Secondary | ICD-10-CM | POA: Diagnosis not present

## 2020-01-29 DIAGNOSIS — E1121 Type 2 diabetes mellitus with diabetic nephropathy: Secondary | ICD-10-CM | POA: Diagnosis not present

## 2020-01-29 DIAGNOSIS — Z992 Dependence on renal dialysis: Secondary | ICD-10-CM | POA: Diagnosis not present

## 2020-01-29 DIAGNOSIS — N2581 Secondary hyperparathyroidism of renal origin: Secondary | ICD-10-CM | POA: Diagnosis not present

## 2020-01-29 DIAGNOSIS — E08621 Diabetes mellitus due to underlying condition with foot ulcer: Secondary | ICD-10-CM | POA: Diagnosis not present

## 2020-01-29 DIAGNOSIS — D631 Anemia in chronic kidney disease: Secondary | ICD-10-CM | POA: Diagnosis not present

## 2020-01-31 DIAGNOSIS — E08621 Diabetes mellitus due to underlying condition with foot ulcer: Secondary | ICD-10-CM | POA: Diagnosis not present

## 2020-01-31 DIAGNOSIS — N186 End stage renal disease: Secondary | ICD-10-CM | POA: Diagnosis not present

## 2020-01-31 DIAGNOSIS — N2581 Secondary hyperparathyroidism of renal origin: Secondary | ICD-10-CM | POA: Diagnosis not present

## 2020-01-31 DIAGNOSIS — D631 Anemia in chronic kidney disease: Secondary | ICD-10-CM | POA: Diagnosis not present

## 2020-01-31 DIAGNOSIS — Z992 Dependence on renal dialysis: Secondary | ICD-10-CM | POA: Diagnosis not present

## 2020-01-31 DIAGNOSIS — D509 Iron deficiency anemia, unspecified: Secondary | ICD-10-CM | POA: Diagnosis not present

## 2020-01-31 DIAGNOSIS — R52 Pain, unspecified: Secondary | ICD-10-CM | POA: Diagnosis not present

## 2020-01-31 DIAGNOSIS — E1121 Type 2 diabetes mellitus with diabetic nephropathy: Secondary | ICD-10-CM | POA: Diagnosis not present

## 2020-02-02 ENCOUNTER — Other Ambulatory Visit: Payer: Self-pay | Admitting: Sports Medicine

## 2020-02-02 DIAGNOSIS — D509 Iron deficiency anemia, unspecified: Secondary | ICD-10-CM | POA: Diagnosis not present

## 2020-02-02 DIAGNOSIS — E08621 Diabetes mellitus due to underlying condition with foot ulcer: Secondary | ICD-10-CM | POA: Diagnosis not present

## 2020-02-02 DIAGNOSIS — E1121 Type 2 diabetes mellitus with diabetic nephropathy: Secondary | ICD-10-CM | POA: Diagnosis not present

## 2020-02-02 DIAGNOSIS — D631 Anemia in chronic kidney disease: Secondary | ICD-10-CM | POA: Diagnosis not present

## 2020-02-02 DIAGNOSIS — N186 End stage renal disease: Secondary | ICD-10-CM | POA: Diagnosis not present

## 2020-02-02 DIAGNOSIS — Z992 Dependence on renal dialysis: Secondary | ICD-10-CM | POA: Diagnosis not present

## 2020-02-02 DIAGNOSIS — N2581 Secondary hyperparathyroidism of renal origin: Secondary | ICD-10-CM | POA: Diagnosis not present

## 2020-02-02 DIAGNOSIS — R52 Pain, unspecified: Secondary | ICD-10-CM | POA: Diagnosis not present

## 2020-02-02 DIAGNOSIS — S91111A Laceration without foreign body of right great toe without damage to nail, initial encounter: Secondary | ICD-10-CM

## 2020-02-05 DIAGNOSIS — Z992 Dependence on renal dialysis: Secondary | ICD-10-CM | POA: Diagnosis not present

## 2020-02-05 DIAGNOSIS — E1121 Type 2 diabetes mellitus with diabetic nephropathy: Secondary | ICD-10-CM | POA: Diagnosis not present

## 2020-02-05 DIAGNOSIS — N2581 Secondary hyperparathyroidism of renal origin: Secondary | ICD-10-CM | POA: Diagnosis not present

## 2020-02-05 DIAGNOSIS — E08621 Diabetes mellitus due to underlying condition with foot ulcer: Secondary | ICD-10-CM | POA: Diagnosis not present

## 2020-02-05 DIAGNOSIS — D631 Anemia in chronic kidney disease: Secondary | ICD-10-CM | POA: Diagnosis not present

## 2020-02-05 DIAGNOSIS — R52 Pain, unspecified: Secondary | ICD-10-CM | POA: Diagnosis not present

## 2020-02-05 DIAGNOSIS — N186 End stage renal disease: Secondary | ICD-10-CM | POA: Diagnosis not present

## 2020-02-05 DIAGNOSIS — D509 Iron deficiency anemia, unspecified: Secondary | ICD-10-CM | POA: Diagnosis not present

## 2020-02-07 DIAGNOSIS — E1121 Type 2 diabetes mellitus with diabetic nephropathy: Secondary | ICD-10-CM | POA: Diagnosis not present

## 2020-02-07 DIAGNOSIS — N2581 Secondary hyperparathyroidism of renal origin: Secondary | ICD-10-CM | POA: Diagnosis not present

## 2020-02-07 DIAGNOSIS — D509 Iron deficiency anemia, unspecified: Secondary | ICD-10-CM | POA: Diagnosis not present

## 2020-02-07 DIAGNOSIS — Z992 Dependence on renal dialysis: Secondary | ICD-10-CM | POA: Diagnosis not present

## 2020-02-07 DIAGNOSIS — D631 Anemia in chronic kidney disease: Secondary | ICD-10-CM | POA: Diagnosis not present

## 2020-02-07 DIAGNOSIS — E08621 Diabetes mellitus due to underlying condition with foot ulcer: Secondary | ICD-10-CM | POA: Diagnosis not present

## 2020-02-07 DIAGNOSIS — N186 End stage renal disease: Secondary | ICD-10-CM | POA: Diagnosis not present

## 2020-02-07 DIAGNOSIS — R52 Pain, unspecified: Secondary | ICD-10-CM | POA: Diagnosis not present

## 2020-02-08 DIAGNOSIS — G4733 Obstructive sleep apnea (adult) (pediatric): Secondary | ICD-10-CM | POA: Diagnosis not present

## 2020-02-09 DIAGNOSIS — Z992 Dependence on renal dialysis: Secondary | ICD-10-CM | POA: Diagnosis not present

## 2020-02-09 DIAGNOSIS — D631 Anemia in chronic kidney disease: Secondary | ICD-10-CM | POA: Diagnosis not present

## 2020-02-09 DIAGNOSIS — R52 Pain, unspecified: Secondary | ICD-10-CM | POA: Diagnosis not present

## 2020-02-09 DIAGNOSIS — N2581 Secondary hyperparathyroidism of renal origin: Secondary | ICD-10-CM | POA: Diagnosis not present

## 2020-02-09 DIAGNOSIS — D509 Iron deficiency anemia, unspecified: Secondary | ICD-10-CM | POA: Diagnosis not present

## 2020-02-09 DIAGNOSIS — E1121 Type 2 diabetes mellitus with diabetic nephropathy: Secondary | ICD-10-CM | POA: Diagnosis not present

## 2020-02-09 DIAGNOSIS — E08621 Diabetes mellitus due to underlying condition with foot ulcer: Secondary | ICD-10-CM | POA: Diagnosis not present

## 2020-02-09 DIAGNOSIS — N186 End stage renal disease: Secondary | ICD-10-CM | POA: Diagnosis not present

## 2020-02-12 DIAGNOSIS — Z992 Dependence on renal dialysis: Secondary | ICD-10-CM | POA: Diagnosis not present

## 2020-02-12 DIAGNOSIS — E1121 Type 2 diabetes mellitus with diabetic nephropathy: Secondary | ICD-10-CM | POA: Diagnosis not present

## 2020-02-12 DIAGNOSIS — E08621 Diabetes mellitus due to underlying condition with foot ulcer: Secondary | ICD-10-CM | POA: Diagnosis not present

## 2020-02-12 DIAGNOSIS — N186 End stage renal disease: Secondary | ICD-10-CM | POA: Diagnosis not present

## 2020-02-12 DIAGNOSIS — D509 Iron deficiency anemia, unspecified: Secondary | ICD-10-CM | POA: Diagnosis not present

## 2020-02-12 DIAGNOSIS — R52 Pain, unspecified: Secondary | ICD-10-CM | POA: Diagnosis not present

## 2020-02-12 DIAGNOSIS — D631 Anemia in chronic kidney disease: Secondary | ICD-10-CM | POA: Diagnosis not present

## 2020-02-12 DIAGNOSIS — N2581 Secondary hyperparathyroidism of renal origin: Secondary | ICD-10-CM | POA: Diagnosis not present

## 2020-02-14 DIAGNOSIS — N2581 Secondary hyperparathyroidism of renal origin: Secondary | ICD-10-CM | POA: Diagnosis not present

## 2020-02-14 DIAGNOSIS — Z992 Dependence on renal dialysis: Secondary | ICD-10-CM | POA: Diagnosis not present

## 2020-02-14 DIAGNOSIS — D509 Iron deficiency anemia, unspecified: Secondary | ICD-10-CM | POA: Diagnosis not present

## 2020-02-14 DIAGNOSIS — D631 Anemia in chronic kidney disease: Secondary | ICD-10-CM | POA: Diagnosis not present

## 2020-02-14 DIAGNOSIS — E08621 Diabetes mellitus due to underlying condition with foot ulcer: Secondary | ICD-10-CM | POA: Diagnosis not present

## 2020-02-14 DIAGNOSIS — R52 Pain, unspecified: Secondary | ICD-10-CM | POA: Diagnosis not present

## 2020-02-14 DIAGNOSIS — N186 End stage renal disease: Secondary | ICD-10-CM | POA: Diagnosis not present

## 2020-02-14 DIAGNOSIS — E1121 Type 2 diabetes mellitus with diabetic nephropathy: Secondary | ICD-10-CM | POA: Diagnosis not present

## 2020-02-16 DIAGNOSIS — N186 End stage renal disease: Secondary | ICD-10-CM | POA: Diagnosis not present

## 2020-02-16 DIAGNOSIS — E08621 Diabetes mellitus due to underlying condition with foot ulcer: Secondary | ICD-10-CM | POA: Diagnosis not present

## 2020-02-16 DIAGNOSIS — R52 Pain, unspecified: Secondary | ICD-10-CM | POA: Diagnosis not present

## 2020-02-16 DIAGNOSIS — E1121 Type 2 diabetes mellitus with diabetic nephropathy: Secondary | ICD-10-CM | POA: Diagnosis not present

## 2020-02-16 DIAGNOSIS — D509 Iron deficiency anemia, unspecified: Secondary | ICD-10-CM | POA: Diagnosis not present

## 2020-02-16 DIAGNOSIS — D631 Anemia in chronic kidney disease: Secondary | ICD-10-CM | POA: Diagnosis not present

## 2020-02-16 DIAGNOSIS — N2581 Secondary hyperparathyroidism of renal origin: Secondary | ICD-10-CM | POA: Diagnosis not present

## 2020-02-16 DIAGNOSIS — Z992 Dependence on renal dialysis: Secondary | ICD-10-CM | POA: Diagnosis not present

## 2020-02-17 DIAGNOSIS — Z992 Dependence on renal dialysis: Secondary | ICD-10-CM | POA: Diagnosis not present

## 2020-02-17 DIAGNOSIS — N186 End stage renal disease: Secondary | ICD-10-CM | POA: Diagnosis not present

## 2020-02-17 DIAGNOSIS — E1122 Type 2 diabetes mellitus with diabetic chronic kidney disease: Secondary | ICD-10-CM | POA: Diagnosis not present

## 2020-02-18 DIAGNOSIS — G8929 Other chronic pain: Secondary | ICD-10-CM | POA: Diagnosis not present

## 2020-02-18 DIAGNOSIS — R262 Difficulty in walking, not elsewhere classified: Secondary | ICD-10-CM | POA: Diagnosis not present

## 2020-02-18 DIAGNOSIS — Z89512 Acquired absence of left leg below knee: Secondary | ICD-10-CM | POA: Diagnosis not present

## 2020-02-18 DIAGNOSIS — R269 Unspecified abnormalities of gait and mobility: Secondary | ICD-10-CM | POA: Diagnosis not present

## 2020-02-18 DIAGNOSIS — M6281 Muscle weakness (generalized): Secondary | ICD-10-CM | POA: Diagnosis not present

## 2020-02-19 DIAGNOSIS — D509 Iron deficiency anemia, unspecified: Secondary | ICD-10-CM | POA: Diagnosis not present

## 2020-02-19 DIAGNOSIS — D631 Anemia in chronic kidney disease: Secondary | ICD-10-CM | POA: Diagnosis not present

## 2020-02-19 DIAGNOSIS — N186 End stage renal disease: Secondary | ICD-10-CM | POA: Diagnosis not present

## 2020-02-19 DIAGNOSIS — Z992 Dependence on renal dialysis: Secondary | ICD-10-CM | POA: Diagnosis not present

## 2020-02-19 DIAGNOSIS — N2581 Secondary hyperparathyroidism of renal origin: Secondary | ICD-10-CM | POA: Diagnosis not present

## 2020-02-21 DIAGNOSIS — D509 Iron deficiency anemia, unspecified: Secondary | ICD-10-CM | POA: Diagnosis not present

## 2020-02-21 DIAGNOSIS — M159 Polyosteoarthritis, unspecified: Secondary | ICD-10-CM | POA: Diagnosis not present

## 2020-02-21 DIAGNOSIS — Z992 Dependence on renal dialysis: Secondary | ICD-10-CM | POA: Diagnosis not present

## 2020-02-21 DIAGNOSIS — N2581 Secondary hyperparathyroidism of renal origin: Secondary | ICD-10-CM | POA: Diagnosis not present

## 2020-02-21 DIAGNOSIS — N186 End stage renal disease: Secondary | ICD-10-CM | POA: Diagnosis not present

## 2020-02-21 DIAGNOSIS — G894 Chronic pain syndrome: Secondary | ICD-10-CM | POA: Diagnosis not present

## 2020-02-21 DIAGNOSIS — I10 Essential (primary) hypertension: Secondary | ICD-10-CM | POA: Diagnosis not present

## 2020-02-21 DIAGNOSIS — D631 Anemia in chronic kidney disease: Secondary | ICD-10-CM | POA: Diagnosis not present

## 2020-02-21 DIAGNOSIS — G4733 Obstructive sleep apnea (adult) (pediatric): Secondary | ICD-10-CM | POA: Diagnosis not present

## 2020-02-23 DIAGNOSIS — Z992 Dependence on renal dialysis: Secondary | ICD-10-CM | POA: Diagnosis not present

## 2020-02-23 DIAGNOSIS — N186 End stage renal disease: Secondary | ICD-10-CM | POA: Diagnosis not present

## 2020-02-23 DIAGNOSIS — D509 Iron deficiency anemia, unspecified: Secondary | ICD-10-CM | POA: Diagnosis not present

## 2020-02-23 DIAGNOSIS — N2581 Secondary hyperparathyroidism of renal origin: Secondary | ICD-10-CM | POA: Diagnosis not present

## 2020-02-23 DIAGNOSIS — D631 Anemia in chronic kidney disease: Secondary | ICD-10-CM | POA: Diagnosis not present

## 2020-02-26 DIAGNOSIS — N2581 Secondary hyperparathyroidism of renal origin: Secondary | ICD-10-CM | POA: Diagnosis not present

## 2020-02-26 DIAGNOSIS — D631 Anemia in chronic kidney disease: Secondary | ICD-10-CM | POA: Diagnosis not present

## 2020-02-26 DIAGNOSIS — Z992 Dependence on renal dialysis: Secondary | ICD-10-CM | POA: Diagnosis not present

## 2020-02-26 DIAGNOSIS — N186 End stage renal disease: Secondary | ICD-10-CM | POA: Diagnosis not present

## 2020-02-26 DIAGNOSIS — D509 Iron deficiency anemia, unspecified: Secondary | ICD-10-CM | POA: Diagnosis not present

## 2020-02-28 DIAGNOSIS — D509 Iron deficiency anemia, unspecified: Secondary | ICD-10-CM | POA: Diagnosis not present

## 2020-02-28 DIAGNOSIS — D631 Anemia in chronic kidney disease: Secondary | ICD-10-CM | POA: Diagnosis not present

## 2020-02-28 DIAGNOSIS — Z992 Dependence on renal dialysis: Secondary | ICD-10-CM | POA: Diagnosis not present

## 2020-02-28 DIAGNOSIS — N2581 Secondary hyperparathyroidism of renal origin: Secondary | ICD-10-CM | POA: Diagnosis not present

## 2020-02-28 DIAGNOSIS — N186 End stage renal disease: Secondary | ICD-10-CM | POA: Diagnosis not present

## 2020-03-01 DIAGNOSIS — D509 Iron deficiency anemia, unspecified: Secondary | ICD-10-CM | POA: Diagnosis not present

## 2020-03-01 DIAGNOSIS — N2581 Secondary hyperparathyroidism of renal origin: Secondary | ICD-10-CM | POA: Diagnosis not present

## 2020-03-01 DIAGNOSIS — D631 Anemia in chronic kidney disease: Secondary | ICD-10-CM | POA: Diagnosis not present

## 2020-03-01 DIAGNOSIS — N186 End stage renal disease: Secondary | ICD-10-CM | POA: Diagnosis not present

## 2020-03-01 DIAGNOSIS — Z992 Dependence on renal dialysis: Secondary | ICD-10-CM | POA: Diagnosis not present

## 2020-03-04 DIAGNOSIS — D509 Iron deficiency anemia, unspecified: Secondary | ICD-10-CM | POA: Diagnosis not present

## 2020-03-04 DIAGNOSIS — N2581 Secondary hyperparathyroidism of renal origin: Secondary | ICD-10-CM | POA: Diagnosis not present

## 2020-03-04 DIAGNOSIS — N186 End stage renal disease: Secondary | ICD-10-CM | POA: Diagnosis not present

## 2020-03-04 DIAGNOSIS — Z992 Dependence on renal dialysis: Secondary | ICD-10-CM | POA: Diagnosis not present

## 2020-03-04 DIAGNOSIS — D631 Anemia in chronic kidney disease: Secondary | ICD-10-CM | POA: Diagnosis not present

## 2020-03-06 DIAGNOSIS — N2581 Secondary hyperparathyroidism of renal origin: Secondary | ICD-10-CM | POA: Diagnosis not present

## 2020-03-06 DIAGNOSIS — D509 Iron deficiency anemia, unspecified: Secondary | ICD-10-CM | POA: Diagnosis not present

## 2020-03-06 DIAGNOSIS — D631 Anemia in chronic kidney disease: Secondary | ICD-10-CM | POA: Diagnosis not present

## 2020-03-06 DIAGNOSIS — N186 End stage renal disease: Secondary | ICD-10-CM | POA: Diagnosis not present

## 2020-03-06 DIAGNOSIS — Z992 Dependence on renal dialysis: Secondary | ICD-10-CM | POA: Diagnosis not present

## 2020-03-08 DIAGNOSIS — Z992 Dependence on renal dialysis: Secondary | ICD-10-CM | POA: Diagnosis not present

## 2020-03-08 DIAGNOSIS — N2581 Secondary hyperparathyroidism of renal origin: Secondary | ICD-10-CM | POA: Diagnosis not present

## 2020-03-08 DIAGNOSIS — D631 Anemia in chronic kidney disease: Secondary | ICD-10-CM | POA: Diagnosis not present

## 2020-03-08 DIAGNOSIS — N186 End stage renal disease: Secondary | ICD-10-CM | POA: Diagnosis not present

## 2020-03-08 DIAGNOSIS — D509 Iron deficiency anemia, unspecified: Secondary | ICD-10-CM | POA: Diagnosis not present

## 2020-03-11 DIAGNOSIS — D631 Anemia in chronic kidney disease: Secondary | ICD-10-CM | POA: Diagnosis not present

## 2020-03-11 DIAGNOSIS — N186 End stage renal disease: Secondary | ICD-10-CM | POA: Diagnosis not present

## 2020-03-11 DIAGNOSIS — Z992 Dependence on renal dialysis: Secondary | ICD-10-CM | POA: Diagnosis not present

## 2020-03-11 DIAGNOSIS — D509 Iron deficiency anemia, unspecified: Secondary | ICD-10-CM | POA: Diagnosis not present

## 2020-03-11 DIAGNOSIS — N2581 Secondary hyperparathyroidism of renal origin: Secondary | ICD-10-CM | POA: Diagnosis not present

## 2020-03-13 ENCOUNTER — Other Ambulatory Visit: Payer: Self-pay | Admitting: *Deleted

## 2020-03-13 DIAGNOSIS — D509 Iron deficiency anemia, unspecified: Secondary | ICD-10-CM | POA: Diagnosis not present

## 2020-03-13 DIAGNOSIS — N186 End stage renal disease: Secondary | ICD-10-CM

## 2020-03-13 DIAGNOSIS — N2581 Secondary hyperparathyroidism of renal origin: Secondary | ICD-10-CM | POA: Diagnosis not present

## 2020-03-13 DIAGNOSIS — D631 Anemia in chronic kidney disease: Secondary | ICD-10-CM | POA: Diagnosis not present

## 2020-03-13 DIAGNOSIS — Z992 Dependence on renal dialysis: Secondary | ICD-10-CM | POA: Diagnosis not present

## 2020-03-14 ENCOUNTER — Encounter (HOSPITAL_COMMUNITY): Payer: Self-pay

## 2020-03-14 ENCOUNTER — Ambulatory Visit (HOSPITAL_COMMUNITY)
Admission: RE | Admit: 2020-03-14 | Discharge: 2020-03-14 | Disposition: A | Discharge status: Home / Self Care | Payer: Medicare Other | Source: Ambulatory Visit | Attending: Vascular Surgery | Admitting: Vascular Surgery

## 2020-03-14 ENCOUNTER — Ambulatory Visit (INDEPENDENT_AMBULATORY_CARE_PROVIDER_SITE_OTHER): Payer: Medicare Other | Admitting: Vascular Surgery

## 2020-03-14 ENCOUNTER — Encounter: Payer: Self-pay | Admitting: Vascular Surgery

## 2020-03-14 ENCOUNTER — Other Ambulatory Visit: Payer: Self-pay

## 2020-03-14 VITALS — BP 176/103 | HR 88 | Temp 98.0°F | Resp 20

## 2020-03-14 DIAGNOSIS — N186 End stage renal disease: Secondary | ICD-10-CM

## 2020-03-14 DIAGNOSIS — Z992 Dependence on renal dialysis: Secondary | ICD-10-CM

## 2020-03-14 NOTE — Progress Notes (Signed)
Referring Physician: Dr Johnney Ou Patient name: Michael Riggs MRN: [id number] DOB: 10/13/1970 Sex: male  REASON FOR CONSULT: Nonhealing wound left hand possible steal  HPI: Michael Riggs is a 49 y.o. male, who developed an ulceration on his left fifth digit a few weeks ago.  This is on the same side of his left arm AV fistula.  He states that this is slowly healing after starting to use some antibiotic cream.  He has baseline numbness and tingling in both hands secondary to neuropathy.  He states he has not really noticed any changes in his hand after having the fistula placed.  He is post left basilic vein transposition fistula by Dr. Donnetta Hutching December 2020. Other medical problems include diabetes, hyperlipidemia, anemia, prior left below-knee amputation.  All of these are currently stable.  Past Medical History:  Diagnosis Date  . Anemia   . Anemia of chronic disease   . Anxiety   . ARF (acute renal failure) ([place]) 01/30/2019  . Calciphylaxis   . Controlled type 2 diabetes mellitus with hyperglycemia, without long-term current use of insulin ([place])   . Debility 02/09/2019  . Depression   . Diabetes mellitus type 2 in obese ([place]) 07/04/2013  . Diabetes mellitus with peripheral vascular disease (HCC)    type 2 no meds, lost 100 lbs  . Dyslipidemia   . Dyspnea    inhaler  . End stage renal disease ([place])   . ESRD (end stage renal disease) on dialysis ([place]) 01/2019   MWFS  . ESRD on dialysis ([place])   . Essential hypertension   . Fluid overload 02/04/2019  . GERD (gastroesophageal reflux disease)   . Goals of care, counseling/discussion   . Habitual alcohol use 07/04/2013  . Headache   . Hyperkalemia 01/30/2019  . Hypertension   . Hypoglycemia 01/30/2019  . Hypokalemia 01/30/2019  . Labile blood pressure   . Leukocytosis   . Lower GI bleed 05/14/2019  . Macrocytic anemia 01/30/2019  . Obesity, Class II, BMI 35-39.9, with comorbidity 07/04/2013  . Palliative care encounter     . PDR (proliferative diabetic retinopathy) ([place]) 12/14/2013  . Physical deconditioning   . Pressure injury of skin 04/27/2019  . Pruritus   . Scrotal edema   . Scrotal pain   . Sleep apnea    uses CPAP  . Sleep disturbance   . Slow transit constipation   . Status post below-knee amputation of left lower extremity ([place])   . Stroke [facility])    mini -shown on CT scan  . Syphilis 01/2019  . Urinary retention   . Venous hypertension 09/22/2013  . Wound infection 03/28/2019   Past Surgical History:  Procedure Laterality Date  . AV FISTULA PLACEMENT Left 01/09/2019   Procedure: ARTERIOVENOUS (AV) FISTULA CREATION LEFT UPPER ARM;  Surgeon: [name], MD;  Location: [facility];  Service: Vascular;  Laterality: Left;  . BASCILIC VEIN TRANSPOSITION Left 07/06/2019   Procedure: BASILIC VEIN TRANSPOSITION SECOND STAGE LEFT ARM;  Surgeon: [name], MD;  Location: [facility] OR;  Service: Vascular;  Laterality: Left;  . below the knee amputation Left   . EYE SURGERY Bilateral   . FLEXIBLE SIGMOIDOSCOPY N/A 05/15/2019   Procedure: FLEXIBLE SIGMOIDOSCOPY;  Surgeon: Rush Landmark Telford Nab., MD;  Location: [facility];  Service: Gastroenterology;  Laterality: N/A;  . HEMOSTASIS CLIP PLACEMENT  05/15/2019   Procedure: HEMOSTASIS CLIP PLACEMENT;  Surgeon: Irving Copas., MD;  Location: [facility];  Service: Gastroenterology;;  . HOT  HEMOSTASIS N/A 05/15/2019   Procedure: HOT HEMOSTASIS (ARGON PLASMA COAGULATION/BICAP);  Surgeon: Irving Copas., MD;  Location: [facility];  Service: Gastroenterology;  Laterality: N/A;  . IR FLUORO GUIDE CV LINE RIGHT  01/31/2019  . IR FLUORO GUIDE CV LINE RIGHT  02/03/2019  . IR THORACENTESIS ASP PLEURAL SPACE W/IMG GUIDE  11/08/2019  . IR US GUIDE VASC ACCESS RIGHT  01/31/2019  . IR US GUIDE VASC ACCESS RIGHT  02/03/2019  . SCLEROTHERAPY  05/15/2019   Procedure: Clide Deutscher;  Surgeon: Mansouraty, Telford Nab., MD;  Location: [facility] ENDOSCOPY;  Service:  Gastroenterology;;    Family History  Problem Relation Age of Onset  . Diabetes Mother   . Multiple myeloma Mother     SOCIAL HISTORY: Social History   Socioeconomic History  . Marital status: Divorced    Spouse name: Not on file  . Number of children: Not on file  . Years of education: Not on file  . Highest education level: Not on file  Occupational History  . Not on file  Tobacco Use  . Smoking status: Former Smoker    Types: Cigarettes    Start date: 11/18/2018    Quit date: 01/18/2019    Years since quitting: 1.1  . Smokeless tobacco: Never Used  . Tobacco comment: pt stated got bored with it  Vaping Use  . Vaping Use: Never used  Substance and Sexual Activity  . Alcohol use: Not Currently    Comment: heavy drinker in the past, none since 07/19/18  . Drug use: Not Currently  . Sexual activity: Not Currently  Other Topics Concern  . Not on file  Social History Narrative  . Not on file   Social Determinants of Health   Financial Resource Strain:   . Difficulty of Paying Living Expenses: Not on file  Food Insecurity:   . Worried About Charity fundraiser in the Last Year: Not on file  . Ran Out of Food in the Last Year: Not on file  Transportation Needs:   . Lack of Transportation (Medical): Not on file  . Lack of Transportation (Non-Medical): Not on file  Physical Activity:   . Days of Exercise per Week: Not on file  . Minutes of Exercise per Session: Not on file  Stress:   . Feeling of Stress : Not on file  Social Connections:   . Frequency of Communication with Friends and Family: Not on file  . Frequency of Social Gatherings with Friends and Family: Not on file  . Attends Religious Services: Not on file  . Active Member of Clubs or Organizations: Not on file  . Attends Archivist Meetings: Not on file  . Marital Status: Not on file  Intimate Partner Violence:   . Fear of Current or Ex-Partner: Not on file  . Emotionally Abused: Not on file    . Physically Abused: Not on file  . Sexually Abused: Not on file    No Known Allergies  Current Outpatient Medications  Medication Sig Dispense Refill  . acetaminophen (TYLENOL) 325 MG tablet Take 2 tablets (650 mg total) by mouth every 6 (six) hours as needed for mild pain (or Fever >/= 101).    Marland Kitchen albuterol (VENTOLIN HFA) 108 (90 Base) MCG/ACT inhaler Inhale 2 puffs into the lungs every 6 (six) hours as needed for wheezing or shortness of breath.    Marland Kitchen amLODipine (NORVASC) 5 MG tablet Take 5 mg by mouth at bedtime.     Marland Kitchen atorvastatin (  LIPITOR) 40 MG tablet Take 1 tablet (40 mg total) by mouth daily. (Patient taking differently: Take 40 mg by mouth at bedtime. )    . buPROPion (WELLBUTRIN XL) 300 MG 24 hr tablet Take 300 mg by mouth at bedtime.     . collagenase (SANTYL) ointment Apply 1 application topically daily. Apply to the affected area daily plus dry dressing 90 g 3  . diclofenac Sodium (VOLTAREN) 1 % GEL Apply 2 g topically 4 (four) times daily. 100 g 0  . Hydrocortisone (GERHARDT'S BUTT CREAM) CREA Apply 1 application topically 3 (three) times daily as needed for irritation. 1 each 0  . Melatonin 5 MG TABS Take 5 mg by mouth at bedtime as needed (sleep).    . mupirocin ointment (BACTROBAN) 2 % Apply to right great toe once daily. 30 g 1  . oxyCODONE (OXY IR/ROXICODONE) 5 MG immediate release tablet Take 5 mg by mouth 2 (two) times daily as needed for moderate pain.     . pantoprazole (PROTONIX) 40 MG tablet Take 1 tablet (40 mg total) by mouth daily. (Patient taking differently: Take 40 mg by mouth at bedtime. )    . sucroferric oxyhydroxide (VELPHORO) 500 MG chewable tablet Chew 1,000 mg by mouth 3 (three) times daily with meals.    . tamsulosin (FLOMAX) 0.4 MG CAPS capsule Take 1 capsule (0.4 mg total) by mouth daily after breakfast. (Patient taking differently: Take 0.4 mg by mouth at bedtime. ) 30 capsule   . traMADol (ULTRAM) 50 MG tablet Take 1 tablet (50 mg total) by mouth  every 8 (eight) hours as needed for moderate pain. 15 tablet 0  . ipratropium-albuterol (DUONEB) 0.5-2.5 (3) MG/3ML SOLN Take 3 mLs by nebulization every 6 (six) hours as needed (For shortness of breath). (Patient not taking: Reported on 03/14/2020)    . oxycodone (OXY-IR) 5 MG capsule Take 1 capsule (5 mg total) by mouth every 6 (six) hours as needed. (Patient not taking: Reported on 03/14/2020) 20 capsule 0   No current facility-administered medications for this visit.    ROS:   General:  No weight loss, Fever, chills  HEENT: No recent headaches, no nasal bleeding, no visual changes, no sore throat  Neurologic: No dizziness, blackouts, seizures. No recent symptoms of stroke or mini- stroke. No recent episodes of slurred speech, or temporary blindness.  Cardiac: No recent episodes of chest pain/pressure, no shortness of breath at rest.  No shortness of breath with exertion.  Denies history of atrial fibrillation or irregular heartbeat  Vascular: No history of rest pain in feet.  No history of claudication.  No history of non-healing ulcer, No history of DVT   Pulmonary: No home oxygen, no productive cough, no hemoptysis,  No asthma or wheezing  Musculoskeletal:  '[ ]'  Arthritis, '[ ]'  Low back pain,  '[ ]'  Joint pain  Hematologic:No history of hypercoagulable state.  No history of easy bleeding.  No history of anemia  Gastrointestinal: No hematochezia or melena,  No gastroesophageal reflux, no trouble swallowing  Urinary: '[X]'  chronic Kidney disease, '[X]'  on HD - '[ ]'  MWF or '[ ]'  TTHS, '[ ]'  Burning with urination, '[ ]'  Frequent urination, '[ ]'  Difficulty urinating;   Skin: No rashes  Psychological: No history of anxiety,  No history of depression   Physical Examination  Vitals:   03/14/20 0917  BP: (!) 176/103  Pulse: 88  Resp: 20  Temp: 98 F (36.7 C)  SpO2: 96%    There is no  height or weight on file to calculate BMI.  General:  Alert and oriented, no acute distress HEENT:  Normal Cardiac: Regular Rate and Rhythm Skin: No rash    Extremity Pulses: Absent left radial pulse palpable thrill left upper arm AV fistula Musculoskeletal: No deformity or edema  Neurologic: Upper and lower extremity motor 5/5 and symmetric  ASSESSMENT: Probably has an element of steal delaying the wound healing in his left hand but currently this is healing spontaneously.  He does not really have any other symptoms of steal such as aching numbness or tingling in his hand other than his baseline neuropathy symptoms.   PLAN: Would prefer at this point careful observation and if this continues to heal spontaneously no intervention planned for the fistula.  He will follow up in our APP clinic in a few weeks to make sure that this completely heals.   Ruta Hinds, MD [facility] Office: [phone]

## 2020-03-15 DIAGNOSIS — D631 Anemia in chronic kidney disease: Secondary | ICD-10-CM | POA: Diagnosis not present

## 2020-03-15 DIAGNOSIS — N2581 Secondary hyperparathyroidism of renal origin: Secondary | ICD-10-CM | POA: Diagnosis not present

## 2020-03-15 DIAGNOSIS — N186 End stage renal disease: Secondary | ICD-10-CM | POA: Diagnosis not present

## 2020-03-15 DIAGNOSIS — Z992 Dependence on renal dialysis: Secondary | ICD-10-CM | POA: Diagnosis not present

## 2020-03-15 DIAGNOSIS — D509 Iron deficiency anemia, unspecified: Secondary | ICD-10-CM | POA: Diagnosis not present

## 2020-03-18 DIAGNOSIS — N186 End stage renal disease: Secondary | ICD-10-CM | POA: Diagnosis not present

## 2020-03-18 DIAGNOSIS — Z992 Dependence on renal dialysis: Secondary | ICD-10-CM | POA: Diagnosis not present

## 2020-03-18 DIAGNOSIS — N2581 Secondary hyperparathyroidism of renal origin: Secondary | ICD-10-CM | POA: Diagnosis not present

## 2020-03-18 DIAGNOSIS — D509 Iron deficiency anemia, unspecified: Secondary | ICD-10-CM | POA: Diagnosis not present

## 2020-03-18 DIAGNOSIS — D631 Anemia in chronic kidney disease: Secondary | ICD-10-CM | POA: Diagnosis not present

## 2020-03-19 DIAGNOSIS — Z992 Dependence on renal dialysis: Secondary | ICD-10-CM | POA: Diagnosis not present

## 2020-03-19 DIAGNOSIS — E1122 Type 2 diabetes mellitus with diabetic chronic kidney disease: Secondary | ICD-10-CM | POA: Diagnosis not present

## 2020-03-19 DIAGNOSIS — N186 End stage renal disease: Secondary | ICD-10-CM | POA: Diagnosis not present

## 2020-03-20 DIAGNOSIS — M6281 Muscle weakness (generalized): Secondary | ICD-10-CM | POA: Diagnosis not present

## 2020-03-20 DIAGNOSIS — Z89512 Acquired absence of left leg below knee: Secondary | ICD-10-CM | POA: Diagnosis not present

## 2020-03-20 DIAGNOSIS — R262 Difficulty in walking, not elsewhere classified: Secondary | ICD-10-CM | POA: Diagnosis not present

## 2020-03-20 DIAGNOSIS — G8929 Other chronic pain: Secondary | ICD-10-CM | POA: Diagnosis not present

## 2020-03-20 DIAGNOSIS — R269 Unspecified abnormalities of gait and mobility: Secondary | ICD-10-CM | POA: Diagnosis not present

## 2020-03-21 DIAGNOSIS — G4733 Obstructive sleep apnea (adult) (pediatric): Secondary | ICD-10-CM | POA: Diagnosis not present

## 2020-03-21 DIAGNOSIS — I10 Essential (primary) hypertension: Secondary | ICD-10-CM | POA: Diagnosis not present

## 2020-03-21 DIAGNOSIS — M159 Polyosteoarthritis, unspecified: Secondary | ICD-10-CM | POA: Diagnosis not present

## 2020-03-21 DIAGNOSIS — G894 Chronic pain syndrome: Secondary | ICD-10-CM | POA: Diagnosis not present

## 2020-03-22 DIAGNOSIS — N186 End stage renal disease: Secondary | ICD-10-CM | POA: Diagnosis not present

## 2020-03-22 DIAGNOSIS — R197 Diarrhea, unspecified: Secondary | ICD-10-CM | POA: Diagnosis not present

## 2020-03-22 DIAGNOSIS — D509 Iron deficiency anemia, unspecified: Secondary | ICD-10-CM | POA: Diagnosis not present

## 2020-03-22 DIAGNOSIS — N2581 Secondary hyperparathyroidism of renal origin: Secondary | ICD-10-CM | POA: Diagnosis not present

## 2020-03-22 DIAGNOSIS — D631 Anemia in chronic kidney disease: Secondary | ICD-10-CM | POA: Diagnosis not present

## 2020-03-22 DIAGNOSIS — Z992 Dependence on renal dialysis: Secondary | ICD-10-CM | POA: Diagnosis not present

## 2020-03-25 DIAGNOSIS — D509 Iron deficiency anemia, unspecified: Secondary | ICD-10-CM | POA: Diagnosis not present

## 2020-03-25 DIAGNOSIS — N2581 Secondary hyperparathyroidism of renal origin: Secondary | ICD-10-CM | POA: Diagnosis not present

## 2020-03-25 DIAGNOSIS — Z992 Dependence on renal dialysis: Secondary | ICD-10-CM | POA: Diagnosis not present

## 2020-03-25 DIAGNOSIS — N186 End stage renal disease: Secondary | ICD-10-CM | POA: Diagnosis not present

## 2020-03-25 DIAGNOSIS — R197 Diarrhea, unspecified: Secondary | ICD-10-CM | POA: Diagnosis not present

## 2020-03-25 DIAGNOSIS — D631 Anemia in chronic kidney disease: Secondary | ICD-10-CM | POA: Diagnosis not present

## 2020-03-27 DIAGNOSIS — N2581 Secondary hyperparathyroidism of renal origin: Secondary | ICD-10-CM | POA: Diagnosis not present

## 2020-03-27 DIAGNOSIS — N186 End stage renal disease: Secondary | ICD-10-CM | POA: Diagnosis not present

## 2020-03-27 DIAGNOSIS — R197 Diarrhea, unspecified: Secondary | ICD-10-CM | POA: Diagnosis not present

## 2020-03-27 DIAGNOSIS — D631 Anemia in chronic kidney disease: Secondary | ICD-10-CM | POA: Diagnosis not present

## 2020-03-27 DIAGNOSIS — D509 Iron deficiency anemia, unspecified: Secondary | ICD-10-CM | POA: Diagnosis not present

## 2020-03-27 DIAGNOSIS — Z992 Dependence on renal dialysis: Secondary | ICD-10-CM | POA: Diagnosis not present

## 2020-03-28 ENCOUNTER — Ambulatory Visit: Payer: Medicare Other

## 2020-03-29 DIAGNOSIS — Z992 Dependence on renal dialysis: Secondary | ICD-10-CM | POA: Diagnosis not present

## 2020-03-29 DIAGNOSIS — D631 Anemia in chronic kidney disease: Secondary | ICD-10-CM | POA: Diagnosis not present

## 2020-03-29 DIAGNOSIS — R197 Diarrhea, unspecified: Secondary | ICD-10-CM | POA: Diagnosis not present

## 2020-03-29 DIAGNOSIS — N186 End stage renal disease: Secondary | ICD-10-CM | POA: Diagnosis not present

## 2020-03-29 DIAGNOSIS — N2581 Secondary hyperparathyroidism of renal origin: Secondary | ICD-10-CM | POA: Diagnosis not present

## 2020-03-29 DIAGNOSIS — D509 Iron deficiency anemia, unspecified: Secondary | ICD-10-CM | POA: Diagnosis not present

## 2020-04-01 DIAGNOSIS — R197 Diarrhea, unspecified: Secondary | ICD-10-CM | POA: Diagnosis not present

## 2020-04-01 DIAGNOSIS — D509 Iron deficiency anemia, unspecified: Secondary | ICD-10-CM | POA: Diagnosis not present

## 2020-04-01 DIAGNOSIS — N186 End stage renal disease: Secondary | ICD-10-CM | POA: Diagnosis not present

## 2020-04-01 DIAGNOSIS — Z992 Dependence on renal dialysis: Secondary | ICD-10-CM | POA: Diagnosis not present

## 2020-04-01 DIAGNOSIS — N2581 Secondary hyperparathyroidism of renal origin: Secondary | ICD-10-CM | POA: Diagnosis not present

## 2020-04-01 DIAGNOSIS — D631 Anemia in chronic kidney disease: Secondary | ICD-10-CM | POA: Diagnosis not present

## 2020-04-03 DIAGNOSIS — Z89512 Acquired absence of left leg below knee: Secondary | ICD-10-CM | POA: Diagnosis not present

## 2020-04-05 DIAGNOSIS — D631 Anemia in chronic kidney disease: Secondary | ICD-10-CM | POA: Diagnosis not present

## 2020-04-05 DIAGNOSIS — R197 Diarrhea, unspecified: Secondary | ICD-10-CM | POA: Diagnosis not present

## 2020-04-05 DIAGNOSIS — N2581 Secondary hyperparathyroidism of renal origin: Secondary | ICD-10-CM | POA: Diagnosis not present

## 2020-04-05 DIAGNOSIS — D509 Iron deficiency anemia, unspecified: Secondary | ICD-10-CM | POA: Diagnosis not present

## 2020-04-05 DIAGNOSIS — Z992 Dependence on renal dialysis: Secondary | ICD-10-CM | POA: Diagnosis not present

## 2020-04-05 DIAGNOSIS — N186 End stage renal disease: Secondary | ICD-10-CM | POA: Diagnosis not present

## 2020-04-08 DIAGNOSIS — Z992 Dependence on renal dialysis: Secondary | ICD-10-CM | POA: Diagnosis not present

## 2020-04-08 DIAGNOSIS — R197 Diarrhea, unspecified: Secondary | ICD-10-CM | POA: Diagnosis not present

## 2020-04-08 DIAGNOSIS — D509 Iron deficiency anemia, unspecified: Secondary | ICD-10-CM | POA: Diagnosis not present

## 2020-04-08 DIAGNOSIS — N2581 Secondary hyperparathyroidism of renal origin: Secondary | ICD-10-CM | POA: Diagnosis not present

## 2020-04-08 DIAGNOSIS — D631 Anemia in chronic kidney disease: Secondary | ICD-10-CM | POA: Diagnosis not present

## 2020-04-08 DIAGNOSIS — N186 End stage renal disease: Secondary | ICD-10-CM | POA: Diagnosis not present

## 2020-04-12 DIAGNOSIS — N2581 Secondary hyperparathyroidism of renal origin: Secondary | ICD-10-CM | POA: Diagnosis not present

## 2020-04-12 DIAGNOSIS — D631 Anemia in chronic kidney disease: Secondary | ICD-10-CM | POA: Diagnosis not present

## 2020-04-12 DIAGNOSIS — Z992 Dependence on renal dialysis: Secondary | ICD-10-CM | POA: Diagnosis not present

## 2020-04-12 DIAGNOSIS — R197 Diarrhea, unspecified: Secondary | ICD-10-CM | POA: Diagnosis not present

## 2020-04-12 DIAGNOSIS — N186 End stage renal disease: Secondary | ICD-10-CM | POA: Diagnosis not present

## 2020-04-12 DIAGNOSIS — D509 Iron deficiency anemia, unspecified: Secondary | ICD-10-CM | POA: Diagnosis not present

## 2020-04-15 DIAGNOSIS — N2581 Secondary hyperparathyroidism of renal origin: Secondary | ICD-10-CM | POA: Diagnosis not present

## 2020-04-15 DIAGNOSIS — R197 Diarrhea, unspecified: Secondary | ICD-10-CM | POA: Diagnosis not present

## 2020-04-15 DIAGNOSIS — D509 Iron deficiency anemia, unspecified: Secondary | ICD-10-CM | POA: Diagnosis not present

## 2020-04-15 DIAGNOSIS — N186 End stage renal disease: Secondary | ICD-10-CM | POA: Diagnosis not present

## 2020-04-15 DIAGNOSIS — D631 Anemia in chronic kidney disease: Secondary | ICD-10-CM | POA: Diagnosis not present

## 2020-04-15 DIAGNOSIS — Z992 Dependence on renal dialysis: Secondary | ICD-10-CM | POA: Diagnosis not present

## 2020-04-18 DIAGNOSIS — E1122 Type 2 diabetes mellitus with diabetic chronic kidney disease: Secondary | ICD-10-CM | POA: Diagnosis not present

## 2020-04-18 DIAGNOSIS — Z992 Dependence on renal dialysis: Secondary | ICD-10-CM | POA: Diagnosis not present

## 2020-04-18 DIAGNOSIS — N186 End stage renal disease: Secondary | ICD-10-CM | POA: Diagnosis not present

## 2020-04-19 DIAGNOSIS — R262 Difficulty in walking, not elsewhere classified: Secondary | ICD-10-CM | POA: Diagnosis not present

## 2020-04-19 DIAGNOSIS — M6281 Muscle weakness (generalized): Secondary | ICD-10-CM | POA: Diagnosis not present

## 2020-04-19 DIAGNOSIS — G8929 Other chronic pain: Secondary | ICD-10-CM | POA: Diagnosis not present

## 2020-04-19 DIAGNOSIS — R269 Unspecified abnormalities of gait and mobility: Secondary | ICD-10-CM | POA: Diagnosis not present

## 2020-04-19 DIAGNOSIS — Z89512 Acquired absence of left leg below knee: Secondary | ICD-10-CM | POA: Diagnosis not present

## 2020-04-23 DIAGNOSIS — E118 Type 2 diabetes mellitus with unspecified complications: Secondary | ICD-10-CM | POA: Diagnosis not present

## 2020-04-23 DIAGNOSIS — I10 Essential (primary) hypertension: Secondary | ICD-10-CM | POA: Diagnosis not present

## 2020-04-23 DIAGNOSIS — E785 Hyperlipidemia, unspecified: Secondary | ICD-10-CM | POA: Diagnosis not present

## 2020-04-25 DIAGNOSIS — G4733 Obstructive sleep apnea (adult) (pediatric): Secondary | ICD-10-CM | POA: Diagnosis not present

## 2020-05-19 DIAGNOSIS — Z992 Dependence on renal dialysis: Secondary | ICD-10-CM | POA: Diagnosis not present

## 2020-05-19 DIAGNOSIS — N186 End stage renal disease: Secondary | ICD-10-CM | POA: Diagnosis not present

## 2020-05-19 DIAGNOSIS — E1122 Type 2 diabetes mellitus with diabetic chronic kidney disease: Secondary | ICD-10-CM | POA: Diagnosis not present

## 2020-05-20 DIAGNOSIS — M6281 Muscle weakness (generalized): Secondary | ICD-10-CM | POA: Diagnosis not present

## 2020-05-20 DIAGNOSIS — R262 Difficulty in walking, not elsewhere classified: Secondary | ICD-10-CM | POA: Diagnosis not present

## 2020-05-20 DIAGNOSIS — Z89512 Acquired absence of left leg below knee: Secondary | ICD-10-CM | POA: Diagnosis not present

## 2020-05-20 DIAGNOSIS — G8929 Other chronic pain: Secondary | ICD-10-CM | POA: Diagnosis not present

## 2020-05-20 DIAGNOSIS — R269 Unspecified abnormalities of gait and mobility: Secondary | ICD-10-CM | POA: Diagnosis not present

## 2020-06-27 ENCOUNTER — Ambulatory Visit (INDEPENDENT_AMBULATORY_CARE_PROVIDER_SITE_OTHER): Payer: Medicare Other | Admitting: Podiatry

## 2020-06-27 ENCOUNTER — Other Ambulatory Visit: Payer: Self-pay

## 2020-06-27 ENCOUNTER — Encounter: Payer: Self-pay | Admitting: Podiatry

## 2020-06-27 ENCOUNTER — Other Ambulatory Visit: Payer: Self-pay | Admitting: *Deleted

## 2020-06-27 DIAGNOSIS — Z89512 Acquired absence of left leg below knee: Secondary | ICD-10-CM

## 2020-06-27 DIAGNOSIS — B351 Tinea unguium: Secondary | ICD-10-CM | POA: Diagnosis not present

## 2020-06-27 DIAGNOSIS — E1142 Type 2 diabetes mellitus with diabetic polyneuropathy: Secondary | ICD-10-CM

## 2020-06-27 DIAGNOSIS — E1151 Type 2 diabetes mellitus with diabetic peripheral angiopathy without gangrene: Secondary | ICD-10-CM | POA: Diagnosis not present

## 2020-07-02 NOTE — Progress Notes (Signed)
Subjective: Michael Riggs presents today at risk foot care. 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   He states his right great toe healed up fine and he forgot about his last appointment with [place].  Michael Nakai, MD is patient's PCP. Last visit was: 02/21/2020.  He voices no new pedal concerns on today's visit. 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  Past Medical History:  Diagnosis Date  . Anemia   . Anemia of chronic disease   . Anxiety   . ARF (acute renal failure) ([place]) 01/30/2019  . Calciphylaxis   . Controlled type 2 diabetes mellitus with hyperglycemia, without long-term current use of insulin ([place])   . Debility 02/09/2019  . Depression   . Diabetes mellitus type 2 in obese ([place]) 07/04/2013  . Diabetes mellitus with peripheral vascular disease (HCC)    type 2 no meds, lost 100 lbs  . Dyslipidemia   . Dyspnea    inhaler  . End stage renal disease ([place])   . ESRD (end stage renal disease) on dialysis ([place]) 01/2019   MWFS  . ESRD on dialysis ([place])   . Essential hypertension   . Fluid overload 02/04/2019  . GERD (gastroesophageal reflux disease)   . Goals of care, counseling/discussion   . Habitual alcohol use 07/04/2013  . Headache   . Hyperkalemia 01/30/2019  . Hypertension   . Hypoglycemia 01/30/2019  . Hypokalemia 01/30/2019  . Labile blood pressure   . Leukocytosis   . Lower GI bleed 05/14/2019  . Macrocytic anemia 01/30/2019  . Obesity, Class II, BMI 35-39.9, with comorbidity 07/04/2013  . Palliative care encounter   . PDR (proliferative diabetic retinopathy) ([place]) 12/14/2013  . Physical deconditioning   . Pressure injury of skin 04/27/2019  . Pruritus   . Scrotal edema   . Scrotal pain   . Sleep apnea    uses CPAP  . Sleep disturbance   . Slow transit constipation   .  Status post below-knee amputation of left lower extremity ([place])   . Stroke [facility])    mini -shown on CT scan  . Syphilis 01/2019  . Urinary retention   . Venous hypertension 09/22/2013  . Wound infection 03/28/2019     Patient Active Problem List   Diagnosis Date Noted  . Acute respiratory failure with hypoxia ([place]) 11/06/2019  . CAP (community acquired pneumonia) 10/11/2019  . Generalized hyperhidrosis 10/11/2019  . Allergy, unspecified, initial encounter 10/03/2019  . Diarrhea, unspecified 07/24/2019  . Lower GI bleed 05/14/2019  . Pressure injury of skin 04/27/2019  . Admission for palliative care 04/27/2019  . Major depressive disorder, single episode, unspecified 04/27/2019  . Other disorders of calcium metabolism 04/27/2019  . Palliative care encounter   . Goals of care, counseling/discussion   . Calciphylaxis   . Wound infection 03/28/2019  . Hypertension   . Dyslipidemia   . Diabetes mellitus with peripheral vascular disease ([place])   . Depression   . Physical deconditioning   . Coagulation defect, unspecified ([place]) 03/15/2019  . Other malaise 03/14/2019  . Other specified disorders of the male genital organs 03/14/2019  . Other specified symptoms and signs involving the circulatory and respiratory systems 03/14/2019  . Unspecified protein-calorie malnutrition ([place]) 03/14/2019  . Pruritus   . Scrotal pain   . Labile blood pressure   . Scrotal edema   .  Status post below-knee amputation of left lower extremity ([place])   . Slow transit constipation   . Leukocytosis   . Sleep disturbance   . Essential hypertension   . Anemia of chronic disease   . Controlled type 2 diabetes mellitus with hyperglycemia, without long-term current use of insulin ([place])   . ESRD on dialysis ([place])   . Urinary retention   . Debility 02/09/2019  . Fluid overload 02/04/2019  . Dyspnea, unspecified 02/03/2019  . Gastro-esophageal reflux disease without esophagitis 02/03/2019  . Hyperlipidemia,  unspecified 02/03/2019  . Iron deficiency anemia, unspecified 02/03/2019  . Major depressive disorder, recurrent, in partial remission ([place]) 02/03/2019  . Other problems related to lifestyle 02/03/2019  . Pain in right leg 02/03/2019  . Pain, unspecified 02/03/2019  . Psychophysiologic insomnia 02/03/2019  . Secondary hyperparathyroidism of renal origin ([place]) 02/03/2019  . Testicular hypofunction 02/03/2019  . Hyperkalemia 01/30/2019  . ARF (acute renal failure) ([place]) 01/30/2019  . Hypokalemia 01/30/2019  . Hypoglycemia 01/30/2019  . Syphilis 01/30/2019  . Macrocytic anemia 01/30/2019  . ESRD (end stage renal disease) ([place])   . ESRD (end stage renal disease) on dialysis ([place]) 01/2019  . PDR (proliferative diabetic retinopathy) ([place]) 12/14/2013  . Venous hypertension 09/22/2013  . Acquired absence of left leg below knee ([place]) 07/20/2013  . Diabetes mellitus type 2 in obese ([place]) 07/04/2013  . Habitual alcohol use 07/04/2013  . Obesity, Class II, BMI 35-39.9, with comorbidity 07/04/2013    Current Outpatient Medications on File Prior to Visit  Medication Sig Dispense Refill  . acetaminophen (TYLENOL) 325 MG tablet Take 2 tablets (650 mg total) by mouth every 6 (six) hours as needed for mild pain (or Fever >/= 101).    Marland Kitchen albuterol (VENTOLIN HFA) 108 (90 Base) MCG/ACT inhaler Inhale 2 puffs into the lungs every 6 (six) hours as needed for wheezing or shortness of breath.    Marland Kitchen amLODipine (NORVASC) 5 MG tablet Take 5 mg by mouth at bedtime.     Marland Kitchen atorvastatin (LIPITOR) 40 MG tablet Take 1 tablet (40 mg total) by mouth daily. (Patient taking differently: Take 40 mg by mouth at bedtime. )    . buPROPion (WELLBUTRIN XL) 300 MG 24 hr tablet Take 300 mg by mouth at bedtime.     . collagenase (SANTYL) ointment Apply 1 application topically daily. Apply to the affected area daily plus dry dressing 90 g 3  . diclofenac Sodium (VOLTAREN) 1 % GEL Apply 2 g topically 4 (four) times daily. 100 g 0  .  Hydrocortisone (GERHARDT'S BUTT CREAM) CREA Apply 1 application topically 3 (three) times daily as needed for irritation. 1 each 0  . ipratropium-albuterol (DUONEB) 0.5-2.5 (3) MG/3ML SOLN Take 3 mLs by nebulization every 6 (six) hours as needed (For shortness of breath). (Patient not taking: Reported on 03/14/2020)    . Melatonin 5 MG TABS Take 5 mg by mouth at bedtime as needed (sleep).    . mupirocin ointment (BACTROBAN) 2 % Apply to right great toe once daily. 30 g 1  . oxyCODONE (OXY IR/ROXICODONE) 5 MG immediate release tablet Take 5 mg by mouth 2 (two) times daily as needed for moderate pain.     Marland Kitchen oxycodone (OXY-IR) 5 MG capsule Take 1 capsule (5 mg total) by mouth every 6 (six) hours as needed. (Patient not taking: Reported on 03/14/2020) 20 capsule 0  . pantoprazole (PROTONIX) 40 MG tablet Take 1 tablet (40 mg total) by mouth daily. (Patient taking differently: Take 40  mg by mouth at bedtime. )    . sucroferric oxyhydroxide (VELPHORO) 500 MG chewable tablet Chew 1,000 mg by mouth 3 (three) times daily with meals.    . tamsulosin (FLOMAX) 0.4 MG CAPS capsule Take 1 capsule (0.4 mg total) by mouth daily after breakfast. (Patient taking differently: Take 0.4 mg by mouth at bedtime. ) 30 capsule   . traMADol (ULTRAM) 50 MG tablet Take 1 tablet (50 mg total) by mouth every 8 (eight) hours as needed for moderate pain. 15 tablet 0   No current facility-administered medications on file prior to visit.     No Known Allergies  Objective: Myrle Dues is a pleasant 49 y.o. Hispanic male obese in NAD. AAO x 3.  There were no vitals filed for this visit.  Vascular Examination: Neurovascular status unchanged right lower extremity. Capillary fill time to digits <3 seconds right lower extremity. Faintly palpable DP pulse(s) right lower extremity. Nonpalpable PT pulse(s) right lower extremity. Pedal hair present. Lower extremity skin temperature gradient within normal limits.  Dermatological  Examination: Pedal skin is thin shiny, atrophic right lower extremity.  No interdigital macerations. Toenails 1-5 right elongated, discolored, dystrophic, thickened, and crumbly with subungual debris and tenderness to dorsal palpation.   Right hallux laceration completely healed.  Dried blood blister resolved/healed right 2nd toe. Stable. No erythema, no edema, no drainage, no flocculence.   Musculoskeletal: Lower extremity amputation(s): below knee amputation right lower extremity. Utilizing BK prosthesis on today. Weightbearing.  Neurological Examination: Protective sensation diminished with 10 gram monofilament right lower extremity. Vibratory sensation diminished right lower extremity.    Assessment: 1. Dermatophytosis of nail   2. Hx of BKA, left ([place])   3. Diabetic polyneuropathy associated with type 2 diabetes mellitus ([name])   4. Diabetes mellitus with peripheral vascular disease ([place])     Plan: -Examined patient.  -Continue diabetic foot care principles. -Toenails 1-5 right foot debrided in length and girth without iatrogenic bleeding with sterile nail nipper and dremel.  -Patient to report any pedal injuries to medical professional immediately. -Patient to continue soft, supportive shoe gear daily. -Patient/POA to call should there be question/concern in the interim.  Return in about 3 months (around 09/25/2020) for diabetic foot care.  Marzetta Board, DPM

## 2020-08-13 ENCOUNTER — Ambulatory Visit: Payer: Medicare Other | Admitting: Vascular Surgery

## 2020-08-13 ENCOUNTER — Inpatient Hospital Stay (HOSPITAL_COMMUNITY): Admission: RE | Admit: 2020-08-13 | Payer: Medicare Other | Source: Ambulatory Visit

## 2020-08-13 ENCOUNTER — Other Ambulatory Visit (HOSPITAL_COMMUNITY): Payer: Self-pay | Admitting: Nurse Practitioner

## 2020-08-13 DIAGNOSIS — R200 Anesthesia of skin: Secondary | ICD-10-CM

## 2020-08-13 DIAGNOSIS — R208 Other disturbances of skin sensation: Secondary | ICD-10-CM

## 2020-08-23 ENCOUNTER — Other Ambulatory Visit: Payer: Self-pay

## 2020-08-23 DIAGNOSIS — N186 End stage renal disease: Secondary | ICD-10-CM

## 2020-08-27 ENCOUNTER — Other Ambulatory Visit (HOSPITAL_COMMUNITY): Payer: Medicare Other

## 2020-08-27 ENCOUNTER — Ambulatory Visit: Payer: Medicare Other | Admitting: Vascular Surgery

## 2020-09-11 DIAGNOSIS — U071 COVID-19: Secondary | ICD-10-CM | POA: Insufficient documentation

## 2020-09-23 DIAGNOSIS — Z8616 Personal history of COVID-19: Secondary | ICD-10-CM | POA: Insufficient documentation

## 2020-09-26 ENCOUNTER — Ambulatory Visit (INDEPENDENT_AMBULATORY_CARE_PROVIDER_SITE_OTHER): Payer: Medicare Other | Admitting: Podiatry

## 2020-09-26 ENCOUNTER — Other Ambulatory Visit: Payer: Self-pay

## 2020-09-26 ENCOUNTER — Encounter: Payer: Self-pay | Admitting: Podiatry

## 2020-09-26 DIAGNOSIS — N186 End stage renal disease: Secondary | ICD-10-CM

## 2020-09-26 DIAGNOSIS — B351 Tinea unguium: Secondary | ICD-10-CM | POA: Diagnosis not present

## 2020-09-26 DIAGNOSIS — E1142 Type 2 diabetes mellitus with diabetic polyneuropathy: Secondary | ICD-10-CM | POA: Diagnosis not present

## 2020-09-26 DIAGNOSIS — Z89512 Acquired absence of left leg below knee: Secondary | ICD-10-CM | POA: Diagnosis not present

## 2020-09-26 DIAGNOSIS — Z992 Dependence on renal dialysis: Secondary | ICD-10-CM

## 2020-09-29 NOTE — Progress Notes (Signed)
Subjective: Jerrell Ashmead presents today at risk foot care. 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   He voices no new pedal problems on today's visit.  He states his blood glucose was 116 mg/dl on yesterday. He has not checked it today.  [name], MD is patient's PCP. Last visit was: 09/02/2020.  He voices no new pedal concerns on today's visit. 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  Past Medical History:  Diagnosis Date  . Anemia   . Anemia of chronic disease   . Anxiety   . ARF (acute renal failure) ([place]) 01/30/2019  . Calciphylaxis   . Controlled type 2 diabetes mellitus with hyperglycemia, without long-term current use of insulin ([place])   . Debility 02/09/2019  . Depression   . Diabetes mellitus type 2 in obese ([place]) 07/04/2013  . Diabetes mellitus with peripheral vascular disease (HCC)    type 2 no meds, lost 100 lbs  . Dyslipidemia   . Dyspnea    inhaler  . End stage renal disease ([place])   . ESRD (end stage renal disease) on dialysis ([place]) 01/2019   MWFS  . ESRD on dialysis ([place])   . Essential hypertension   . Fluid overload 02/04/2019  . GERD (gastroesophageal reflux disease)   . Goals of care, counseling/discussion   . Habitual alcohol use 07/04/2013  . Headache   . Hyperkalemia 01/30/2019  . Hypertension   . Hypoglycemia 01/30/2019  . Hypokalemia 01/30/2019  . Labile blood pressure   . Leukocytosis   . Lower GI bleed 05/14/2019  . Macrocytic anemia 01/30/2019  . Obesity, Class II, BMI 35-39.9, with comorbidity 07/04/2013  . Palliative care encounter   . PDR (proliferative diabetic retinopathy) ([place]) 12/14/2013  . Physical deconditioning   . Pressure injury of skin 04/27/2019  . Pruritus   . Scrotal edema   . Scrotal pain   . Sleep apnea    uses CPAP  . Sleep  disturbance   . Slow transit constipation   . Status post below-knee amputation of left lower extremity ([place])   . Stroke [facility])    mini -shown on CT scan  . Syphilis 01/2019  . Urinary retention   . Venous hypertension 09/22/2013  . Wound infection 03/28/2019     Patient Active Problem List   Diagnosis Date Noted  . Personal history of COVID-19 09/23/2020  . COVID-19 09/11/2020  . Acute respiratory failure with hypoxia ([place]) 11/06/2019  . CAP (community acquired pneumonia) 10/11/2019  . Generalized hyperhidrosis 10/11/2019  . Allergy, unspecified, initial encounter 10/03/2019  . Diarrhea, unspecified 07/24/2019  . Lower GI bleed 05/14/2019  . Pressure injury of skin 04/27/2019  . Admission for palliative care 04/27/2019  . Major depressive disorder, single episode, unspecified 04/27/2019  . Other disorders of calcium metabolism 04/27/2019  . Palliative care encounter   . Goals of care, counseling/discussion   . Calciphylaxis   . Wound infection 03/28/2019  . Hypertension   . Dyslipidemia   . Diabetes mellitus with peripheral vascular disease ([place])   . Depression   . Physical deconditioning   . Coagulation defect, unspecified ([place]) 03/15/2019  . Other malaise 03/14/2019  . Other specified disorders of the male genital organs 03/14/2019  . Other specified symptoms and signs involving the circulatory and respiratory systems 03/14/2019  . Unspecified protein-calorie malnutrition ([place]) 03/14/2019  .  Pruritus   . Scrotal pain   . Labile blood pressure   . Scrotal edema   . Status post below-knee amputation of left lower extremity ([place])   . Slow transit constipation   . Leukocytosis   . Sleep disturbance   . Essential hypertension   . Anemia of chronic disease   . Controlled type 2 diabetes mellitus with hyperglycemia, without long-term current use of insulin ([place])   . ESRD on dialysis ([place])   . Urinary retention   . Debility 02/09/2019  . Fluid overload 02/04/2019  .  Dyspnea, unspecified 02/03/2019  . Gastro-esophageal reflux disease without esophagitis 02/03/2019  . Hyperlipidemia, unspecified 02/03/2019  . Iron deficiency anemia, unspecified 02/03/2019  . Major depressive disorder, recurrent, in partial remission ([place]) 02/03/2019  . Other problems related to lifestyle 02/03/2019  . Pain in right leg 02/03/2019  . Pain, unspecified 02/03/2019  . Psychophysiologic insomnia 02/03/2019  . Secondary hyperparathyroidism of renal origin ([place]) 02/03/2019  . Testicular hypofunction 02/03/2019  . Hyperkalemia 01/30/2019  . ARF (acute renal failure) ([place]) 01/30/2019  . Hypokalemia 01/30/2019  . Hypoglycemia 01/30/2019  . Syphilis 01/30/2019  . Macrocytic anemia 01/30/2019  . ESRD (end stage renal disease) ([place])   . ESRD (end stage renal disease) on dialysis ([place]) 01/2019  . PDR (proliferative diabetic retinopathy) ([place]) 12/14/2013  . Venous hypertension 09/22/2013  . Acquired absence of left leg below knee ([place]) 07/20/2013  . Diabetes mellitus type 2 in obese ([place]) 07/04/2013  . Habitual alcohol use 07/04/2013  . Obesity, Class II, BMI 35-39.9, with comorbidity 07/04/2013    Current Outpatient Medications on File Prior to Visit  Medication Sig Dispense Refill  . ferric citrate (AURYXIA) 1 GM 210 MG(Fe) tablet Take by mouth.    Marland Kitchen LOPERAMIDE HCL PO Take by mouth.    Marland Kitchen acetaminophen (TYLENOL) 325 MG tablet Take 2 tablets (650 mg total) by mouth every 6 (six) hours as needed for mild pain (or Fever >/= 101).    Marland Kitchen albuterol (VENTOLIN HFA) 108 (90 Base) MCG/ACT inhaler Inhale 2 puffs into the lungs every 6 (six) hours as needed for wheezing or shortness of breath.    Marland Kitchen amLODipine (NORVASC) 5 MG tablet Take 5 mg by mouth at bedtime.     Marland Kitchen atorvastatin (LIPITOR) 40 MG tablet Take 1 tablet (40 mg total) by mouth daily. (Patient taking differently: Take 40 mg by mouth at bedtime. )    . buPROPion (WELLBUTRIN XL) 300 MG 24 hr tablet Take 300 mg by mouth at  bedtime.     . clotrimazole-betamethasone (LOTRISONE) cream Apply topically.    . collagenase (SANTYL) ointment Apply 1 application topically daily. Apply to the affected area daily plus dry dressing 90 g 3  . diclofenac Sodium (VOLTAREN) 1 % GEL Apply 2 g topically 4 (four) times daily. 100 g 0  . Hydrocortisone (GERHARDT'S BUTT CREAM) CREA Apply 1 application topically 3 (three) times daily as needed for irritation. 1 each 0  . ipratropium-albuterol (DUONEB) 0.5-2.5 (3) MG/3ML SOLN Take 3 mLs by nebulization every 6 (six) hours as needed (For shortness of breath). (Patient not taking: Reported on 03/14/2020)    . Melatonin 5 MG TABS Take 5 mg by mouth at bedtime as needed (sleep).    . midodrine (PROAMATINE) 10 MG tablet Take by mouth.    . mupirocin ointment (BACTROBAN) 2 % Apply to right great toe once daily. 30 g 1  . oxyCODONE (OXY IR/ROXICODONE) 5 MG immediate release tablet Take 5  mg by mouth 2 (two) times daily as needed for moderate pain.     Marland Kitchen oxycodone (OXY-IR) 5 MG capsule Take 1 capsule (5 mg total) by mouth every 6 (six) hours as needed. (Patient not taking: Reported on 03/14/2020) 20 capsule 0  . pantoprazole (PROTONIX) 40 MG tablet Take 1 tablet (40 mg total) by mouth daily. (Patient taking differently: Take 40 mg by mouth at bedtime. )    . sucroferric oxyhydroxide (VELPHORO) 500 MG chewable tablet Chew 1,000 mg by mouth 3 (three) times daily with meals.    . tamsulosin (FLOMAX) 0.4 MG CAPS capsule Take 1 capsule (0.4 mg total) by mouth daily after breakfast. (Patient taking differently: Take 0.4 mg by mouth at bedtime. ) 30 capsule   . traMADol (ULTRAM) 50 MG tablet Take 1 tablet (50 mg total) by mouth every 8 (eight) hours as needed for moderate pain. 15 tablet 0   No current facility-administered medications on file prior to visit.     No Known Allergies  Objective: Michaeljames Heglund is a pleasant 50 y.o. Hispanic male obese in NAD. AAO x 3.  There were no vitals filed  for this visit.  Vascular Examination: Neurovascular status unchanged right lower extremity. Capillary fill time to digits <3 seconds right lower extremity. Faintly palpable DP pulse(s) right lower extremity. Nonpalpable PT pulse(s) right lower extremity. Pedal hair present. Lower extremity skin temperature gradient within normal limits.  Dermatological Examination: Pedal skin is thin shiny, atrophic right lower extremity.  No interdigital macerations. Toenails 1-5 right elongated, discolored, dystrophic, thickened, and crumbly with subungual debris and tenderness to dorsal palpation.   Musculoskeletal: Lower extremity amputation(s): below knee amputation right lower extremity. Utilizing BK prosthesis on today. Weightbearing.  Neurological Examination: Protective sensation diminished with 10 gram monofilament right lower extremity. Vibratory sensation diminished right lower extremity.    Assessment: 1. Dermatophytosis of nail   2. Hx of BKA, left ([place])   3. ESRD on dialysis ([place])   4. Diabetic polyneuropathy associated with type 2 diabetes mellitus ([place])     Plan: -Examined patient.  -Continue diabetic foot care principles. -Toenails 1-5 right foot debrided in length and girth without iatrogenic bleeding with sterile nail nipper and dremel.  -Patient to report any pedal injuries to medical professional immediately. -Patient to continue soft, supportive shoe gear daily. -Patient/POA to call should there be question/concern in the interim.  Return in about 3 months (around 12/27/2020).  Marzetta Board, DPM

## 2020-10-11 ENCOUNTER — Other Ambulatory Visit: Payer: Self-pay | Admitting: *Deleted

## 2020-10-11 DIAGNOSIS — N186 End stage renal disease: Secondary | ICD-10-CM

## 2020-10-15 ENCOUNTER — Encounter: Payer: Self-pay | Admitting: Vascular Surgery

## 2020-10-15 ENCOUNTER — Ambulatory Visit: Payer: Medicare Other | Admitting: Endocrinology

## 2020-10-15 ENCOUNTER — Other Ambulatory Visit: Payer: Self-pay

## 2020-10-15 ENCOUNTER — Ambulatory Visit (INDEPENDENT_AMBULATORY_CARE_PROVIDER_SITE_OTHER): Payer: Medicare Other | Admitting: Vascular Surgery

## 2020-10-15 ENCOUNTER — Ambulatory Visit (HOSPITAL_COMMUNITY)
Admission: RE | Admit: 2020-10-15 | Discharge: 2020-10-15 | Disposition: A | Discharge status: Home / Self Care | Payer: Medicare Other | Source: Ambulatory Visit | Attending: Vascular Surgery | Admitting: Vascular Surgery

## 2020-10-15 VITALS — BP 159/85 | HR 80 | Temp 98.2°F | Resp 16 | Ht 70.0 in | Wt 215.0 lb

## 2020-10-15 DIAGNOSIS — Z992 Dependence on renal dialysis: Secondary | ICD-10-CM | POA: Diagnosis not present

## 2020-10-15 DIAGNOSIS — N186 End stage renal disease: Secondary | ICD-10-CM

## 2020-10-15 NOTE — Progress Notes (Signed)
Patient name: Michael Riggs MRN: [id number] DOB: Dec 26, 1970 Sex: male  REASON FOR VISIT: Evaluate for left hand steal and right foot numbness  HPI: Michael Riggs is a 49 y.o. male with multiple medical problems including end-stage renal disease, diabetes, left BKA that presents for evaluation of steal on the left upper extremity as well as numbness in the right foot.  As it relates to his left hand numbness, he states this has been ongoing for months to years.  He has a left arm basilic vein fistula that was placed in 2020 by Dr. Donnetta Hutching.  He previously was seen by Dr. Oneida Alar last year in August 2021 with an ulceration at the left fifth digit with some baseline numbness in the hand and probably had a component of steal at that time.  Fortunately this completely healed on its own without intervention.  He states the numbness and pain is in the fifth digit of his left hand and only occurs during dialysis.  He is usually able to tolerate.  He states it has probably been 3 months since he had an episode where it was really painful.  He has no active tissue loss at this time.  The fistula is working well.  He also notes some chronic numbness in the right foot that has been ongoing for years.  Past Medical History:  Diagnosis Date  . Anemia   . Anemia of chronic disease   . Anxiety   . ARF (acute renal failure) ([place]) 01/30/2019  . Calciphylaxis   . Controlled type 2 diabetes mellitus with hyperglycemia, without long-term current use of insulin ([place])   . Debility 02/09/2019  . Depression   . Diabetes mellitus type 2 in obese ([place]) 07/04/2013  . Diabetes mellitus with peripheral vascular disease ([facility])    type 2 no meds, lost 100 lbs  . Dyslipidemia   . Dyspnea    inhaler  . End stage renal disease ([place])   . ESRD (end stage renal disease) on dialysis ([place]) 01/2019   MWFS  . ESRD on dialysis ([place])   . Essential hypertension   . Fluid overload 02/04/2019  . GERD (gastroesophageal  reflux disease)   . Goals of care, counseling/discussion   . Habitual alcohol use 07/04/2013  . Headache   . Hyperkalemia 01/30/2019  . Hypertension   . Hypoglycemia 01/30/2019  . Hypokalemia 01/30/2019  . Labile blood pressure   . Leukocytosis   . Lower GI bleed 05/14/2019  . Macrocytic anemia 01/30/2019  . Obesity, Class II, BMI 35-39.9, with comorbidity 07/04/2013  . Palliative care encounter   . PDR (proliferative diabetic retinopathy) ([place]) 12/14/2013  . Physical deconditioning   . Pressure injury of skin 04/27/2019  . Pruritus   . Scrotal edema   . Scrotal pain   . Sleep apnea    uses CPAP  . Sleep disturbance   . Slow transit constipation   . Status post below-knee amputation of left lower extremity ([place])   . Stroke [facility])    mini -shown on CT scan  . Syphilis 01/2019  . Urinary retention   . Venous hypertension 09/22/2013  . Wound infection 03/28/2019    Past Surgical History:  Procedure Laterality Date  . AV FISTULA PLACEMENT Left 01/09/2019   Procedure: ARTERIOVENOUS (AV) FISTULA CREATION LEFT UPPER ARM;  Surgeon: [name], MD;  Location: [place];  Service: Vascular;  Laterality: Left;  . BASCILIC VEIN TRANSPOSITION Left 07/06/2019   Procedure: BASILIC VEIN TRANSPOSITION SECOND STAGE  LEFT ARM;  Surgeon: [name], MD;  [facility] OR;  Service: Vascular;  Laterality: Left;  . below the knee amputation Left   . EYE SURGERY Bilateral   . FLEXIBLE SIGMOIDOSCOPY N/A 05/15/2019   Procedure: FLEXIBLE SIGMOIDOSCOPY;  Surgeon: Rush Landmark Telford Nab., MD;  Location: [place];  Service: Gastroenterology;  Laterality: N/A;  . HEMOSTASIS CLIP PLACEMENT  05/15/2019   Procedure: HEMOSTASIS CLIP PLACEMENT;  Surgeon: Irving Copas., MD;  Location: [place];  Service: Gastroenterology;;  . HOT HEMOSTASIS N/A 05/15/2019   Procedure: HOT HEMOSTASIS (ARGON PLASMA COAGULATION/BICAP);  Surgeon: Irving Copas., MD;  Location: [place];  Service:  Gastroenterology;  Laterality: N/A;  . IR FLUORO GUIDE CV LINE RIGHT  01/31/2019  . IR FLUORO GUIDE CV LINE RIGHT  02/03/2019  . IR THORACENTESIS ASP PLEURAL SPACE W/IMG GUIDE  11/08/2019  . IR US GUIDE VASC ACCESS RIGHT  01/31/2019  . IR US GUIDE VASC ACCESS RIGHT  02/03/2019  . SCLEROTHERAPY  05/15/2019   Procedure: Clide Deutscher;  Surgeon: Mansouraty, Telford Nab., MD;  Location: [facility] ENDOSCOPY;  Service: Gastroenterology;;    Family History  Problem Relation Age of Onset  . Diabetes Mother   . Multiple myeloma Mother     SOCIAL HISTORY: Social History   Tobacco Use  . Smoking status: Former Smoker    Types: Cigarettes    Start date: 11/18/2018    Quit date: 01/18/2019    Years since quitting: 1.7  . Smokeless tobacco: Never Used  . Tobacco comment: pt stated got bored with it  Substance Use Topics  . Alcohol use: Not Currently    Comment: heavy drinker in the past, none since 07/19/18    No Known Allergies  Current Outpatient Medications  Medication Sig Dispense Refill  . acetaminophen (TYLENOL) 325 MG tablet Take 2 tablets (650 mg total) by mouth every 6 (six) hours as needed for mild pain (or Fever >/= 101).    Marland Kitchen albuterol (VENTOLIN HFA) 108 (90 Base) MCG/ACT inhaler Inhale 2 puffs into the lungs every 6 (six) hours as needed for wheezing or shortness of breath.    Marland Kitchen amLODipine (NORVASC) 5 MG tablet Take 5 mg by mouth at bedtime.     Marland Kitchen atorvastatin (LIPITOR) 40 MG tablet Take 1 tablet (40 mg total) by mouth daily. (Patient taking differently: Take 40 mg by mouth at bedtime.)    . buPROPion (WELLBUTRIN XL) 300 MG 24 hr tablet Take 300 mg by mouth at bedtime.     . clotrimazole-betamethasone (LOTRISONE) cream Apply topically.    . collagenase (SANTYL) ointment Apply 1 application topically daily. Apply to the affected area daily plus dry dressing 90 g 3  . diclofenac Sodium (VOLTAREN) 1 % GEL Apply 2 g topically 4 (four) times daily. 100 g 0  . ferric citrate (AURYXIA) 1 GM 210  MG(Fe) tablet Take by mouth.    . Hydrocortisone (GERHARDT'S BUTT CREAM) CREA Apply 1 application topically 3 (three) times daily as needed for irritation. 1 each 0  . LOPERAMIDE HCL PO Take by mouth.    . Melatonin 5 MG TABS Take 5 mg by mouth at bedtime as needed (sleep).    . midodrine (PROAMATINE) 10 MG tablet Take by mouth.    . mupirocin ointment (BACTROBAN) 2 % Apply to right great toe once daily. 30 g 1  . oxyCODONE (OXY IR/ROXICODONE) 5 MG immediate release tablet Take 5 mg by mouth 2 (two) times daily as needed for moderate pain.     Marland Kitchen  oxycodone (OXY-IR) 5 MG capsule Take 1 capsule (5 mg total) by mouth every 6 (six) hours as needed. 20 capsule 0  . pantoprazole (PROTONIX) 40 MG tablet Take 1 tablet (40 mg total) by mouth daily. (Patient taking differently: Take 40 mg by mouth at bedtime.)    . sucroferric oxyhydroxide (VELPHORO) 500 MG chewable tablet Chew 1,000 mg by mouth 3 (three) times daily with meals.    . tamsulosin (FLOMAX) 0.4 MG CAPS capsule Take 1 capsule (0.4 mg total) by mouth daily after breakfast. (Patient taking differently: Take 0.4 mg by mouth at bedtime.) 30 capsule   . traMADol (ULTRAM) 50 MG tablet Take 1 tablet (50 mg total) by mouth every 8 (eight) hours as needed for moderate pain. 15 tablet 0  . ipratropium-albuterol (DUONEB) 0.5-2.5 (3) MG/3ML SOLN Take 3 mLs by nebulization every 6 (six) hours as needed (For shortness of breath). (Patient not taking: No sig reported)     No current facility-administered medications for this visit.    REVIEW OF SYSTEMS:  '[X]'  denotes positive finding, '[ ]'  denotes negative finding Cardiac  Comments:  Chest pain or chest pressure:    Shortness of breath upon exertion:    Short of breath when lying flat:    Irregular heart rhythm:        Vascular    Pain in calf, thigh, or hip brought on by ambulation:    Pain in feet at night that wakes you up from your sleep:  x Right foot numbness  Blood clot in your veins:    Leg  swelling:         Pulmonary    Oxygen at home:    Productive cough:     Wheezing:         Neurologic    Sudden weakness in arms or legs:     Sudden numbness in arms or legs:  x Left hand, during dialysis  Sudden onset of difficulty speaking or slurred speech:    Temporary loss of vision in one eye:     Problems with dizziness:         Gastrointestinal    Blood in stool:     Vomited blood:         Genitourinary    Burning when urinating:     Blood in urine:        Psychiatric    Major depression:         Hematologic    Bleeding problems:    Problems with blood clotting too easily:        Skin    Rashes or ulcers:        Constitutional    Fever or chills:      PHYSICAL EXAM: Vitals:   10/15/20 1155  BP: (!) 159/85  Pulse: 80  Resp: 16  Temp: 98.2 F (36.8 C)  TempSrc: Temporal  SpO2: 95%  Weight: 215 lb (97.5 kg)  Height: '5\' 10"'  (1.778 m)    GENERAL: The patient is a well-nourished male, in no acute distress. The vital signs are documented above. CARDIAC: There is a regular rate and rhythm.  VASCULAR:  Left basilic vein fistula with excellent thrill Left brachial pulse palpable Unable to appreciate any pulses at left wrist No active left hand tissue loss Right DP palpable Left BKA PULMONARY: No respiratory distress. ABDOMEN: Soft and non-tender. MUSCULOSKELETAL: There are no major deformities or cyanosis. NEUROLOGIC: No focal weakness or paresthesias are detected. SKIN: There are no ulcers  or rashes noted. PSYCHIATRIC: The patient has a normal affect.  DATA:   Steal study today shows second digit pressure increased from 101 mmHg to 250 mmHg with compression of the left arm AV fistula  Assessment/Plan:  50 year old male presents for evaluation of steal in the left upper extremity where he has a functioning brachiobasilic fistula.  I cannot appreciate any pulses at the wrist with the fistula patent and his steal study today shows an increase in  digit pressure from 101 to 250 mmHg with compression of the fistula.  I certainly think he does have a component of steal as discussed with him today.  Fortunately he feels to be tolerating the steal given it is only in the left fifth digit where he has some numbness and pain only during dialysis.  It has been over 3 months since he could not tolerate dialysis.  I discussed the option of proceeding with a left upper extremity arteriogram to evaluate for inflow disease given I cannot palpate a pulse at the wrist but he wants to monitor given that he can tolerate the degree of steal at this time.  I gave him our office contact and discussed he let us know if this gets worse.  No tissue loss at this time and has healed his left 5th finger ulcer.    At it relates to his right foot, I think he has chronic neuropathy at baseline and he has an easily palpable pulse in the right foot and this does not appear to be due to any severe PAD.     Marty Heck, MD [facility] Office: [phone]

## 2020-11-27 ENCOUNTER — Other Ambulatory Visit: Payer: Self-pay

## 2020-11-27 ENCOUNTER — Emergency Department (HOSPITAL_COMMUNITY)
Admission: EM | Admit: 2020-11-27 | Discharge: 2020-11-27 | Disposition: A | Discharge status: Home / Self Care | Payer: Medicare Other | Attending: Emergency Medicine | Admitting: Emergency Medicine

## 2020-11-27 ENCOUNTER — Encounter (HOSPITAL_COMMUNITY): Payer: Self-pay

## 2020-11-27 ENCOUNTER — Emergency Department (HOSPITAL_COMMUNITY): Payer: Medicare Other

## 2020-11-27 DIAGNOSIS — Z87891 Personal history of nicotine dependence: Secondary | ICD-10-CM | POA: Diagnosis not present

## 2020-11-27 DIAGNOSIS — R61 Generalized hyperhidrosis: Secondary | ICD-10-CM | POA: Diagnosis not present

## 2020-11-27 DIAGNOSIS — R55 Syncope and collapse: Secondary | ICD-10-CM | POA: Diagnosis not present

## 2020-11-27 DIAGNOSIS — Z79899 Other long term (current) drug therapy: Secondary | ICD-10-CM | POA: Diagnosis not present

## 2020-11-27 DIAGNOSIS — I10 Essential (primary) hypertension: Secondary | ICD-10-CM

## 2020-11-27 DIAGNOSIS — I120 Hypertensive chronic kidney disease with stage 5 chronic kidney disease or end stage renal disease: Secondary | ICD-10-CM | POA: Insufficient documentation

## 2020-11-27 DIAGNOSIS — N186 End stage renal disease: Secondary | ICD-10-CM | POA: Diagnosis not present

## 2020-11-27 DIAGNOSIS — Z992 Dependence on renal dialysis: Secondary | ICD-10-CM | POA: Insufficient documentation

## 2020-11-27 DIAGNOSIS — E1122 Type 2 diabetes mellitus with diabetic chronic kidney disease: Secondary | ICD-10-CM | POA: Insufficient documentation

## 2020-11-27 DIAGNOSIS — R9431 Abnormal electrocardiogram [ECG] [EKG]: Secondary | ICD-10-CM | POA: Diagnosis not present

## 2020-11-27 DIAGNOSIS — Z8616 Personal history of COVID-19: Secondary | ICD-10-CM | POA: Diagnosis not present

## 2020-11-27 LAB — MAGNESIUM: Magnesium: 2.2 mg/dL (ref 1.7–2.4)

## 2020-11-27 LAB — CBC WITH DIFFERENTIAL/PLATELET
Abs Immature Granulocytes: 0.07 10*3/uL (ref 0.00–0.07)
Basophils Absolute: 0.1 10*3/uL (ref 0.0–0.1)
Basophils Relative: 1 %
Eosinophils Absolute: 0.2 10*3/uL (ref 0.0–0.5)
Eosinophils Relative: 4 %
HCT: 31 % — ABNORMAL LOW (ref 39.0–52.0)
Hemoglobin: 9.9 g/dL — ABNORMAL LOW (ref 13.0–17.0)
Immature Granulocytes: 1 %
Lymphocytes Relative: 17 %
Lymphs Abs: 1 10*3/uL (ref 0.7–4.0)
MCH: 31.8 pg (ref 26.0–34.0)
MCHC: 31.9 g/dL (ref 30.0–36.0)
MCV: 99.7 fL (ref 80.0–100.0)
Monocytes Absolute: 0.6 10*3/uL (ref 0.1–1.0)
Monocytes Relative: 11 %
Neutro Abs: 3.8 10*3/uL (ref 1.7–7.7)
Neutrophils Relative %: 66 %
Platelets: 197 10*3/uL (ref 150–400)
RBC: 3.11 MIL/uL — ABNORMAL LOW (ref 4.22–5.81)
RDW: 13.5 % (ref 11.5–15.5)
WBC: 5.8 10*3/uL (ref 4.0–10.5)
nRBC: 0 % (ref 0.0–0.2)

## 2020-11-27 LAB — COMPREHENSIVE METABOLIC PANEL
ALT: 9 U/L (ref 0–44)
AST: 10 U/L — ABNORMAL LOW (ref 15–41)
Albumin: 2.9 g/dL — ABNORMAL LOW (ref 3.5–5.0)
Alkaline Phosphatase: 74 U/L (ref 38–126)
Anion gap: 6 (ref 5–15)
BUN: 16 mg/dL (ref 6–20)
CO2: 28 mmol/L (ref 22–32)
Calcium: 7.3 mg/dL — ABNORMAL LOW (ref 8.9–10.3)
Chloride: 102 mmol/L (ref 98–111)
Creatinine, Ser: 3.77 mg/dL — ABNORMAL HIGH (ref 0.61–1.24)
GFR, Estimated: 19 mL/min — ABNORMAL LOW (ref >60)
Glucose, Bld: 104 mg/dL — ABNORMAL HIGH (ref 70–99)
Potassium: 3 mmol/L — ABNORMAL LOW (ref 3.5–5.1)
Sodium: 136 mmol/L (ref 135–145)
Total Bilirubin: 0.4 mg/dL (ref 0.3–1.2)
Total Protein: 6.3 g/dL — ABNORMAL LOW (ref 6.5–8.1)

## 2020-11-27 LAB — PHOSPHORUS: Phosphorus: 3.3 mg/dL (ref 2.5–4.6)

## 2020-11-27 IMAGING — DX DG CHEST 1V PORT
1 series · 1 of 1 positions shown · non-contrast
Comparison: [DATE]

CLINICAL DATA: Syncope

EXAM:
PORTABLE CHEST 1 VIEW

[chest ap]
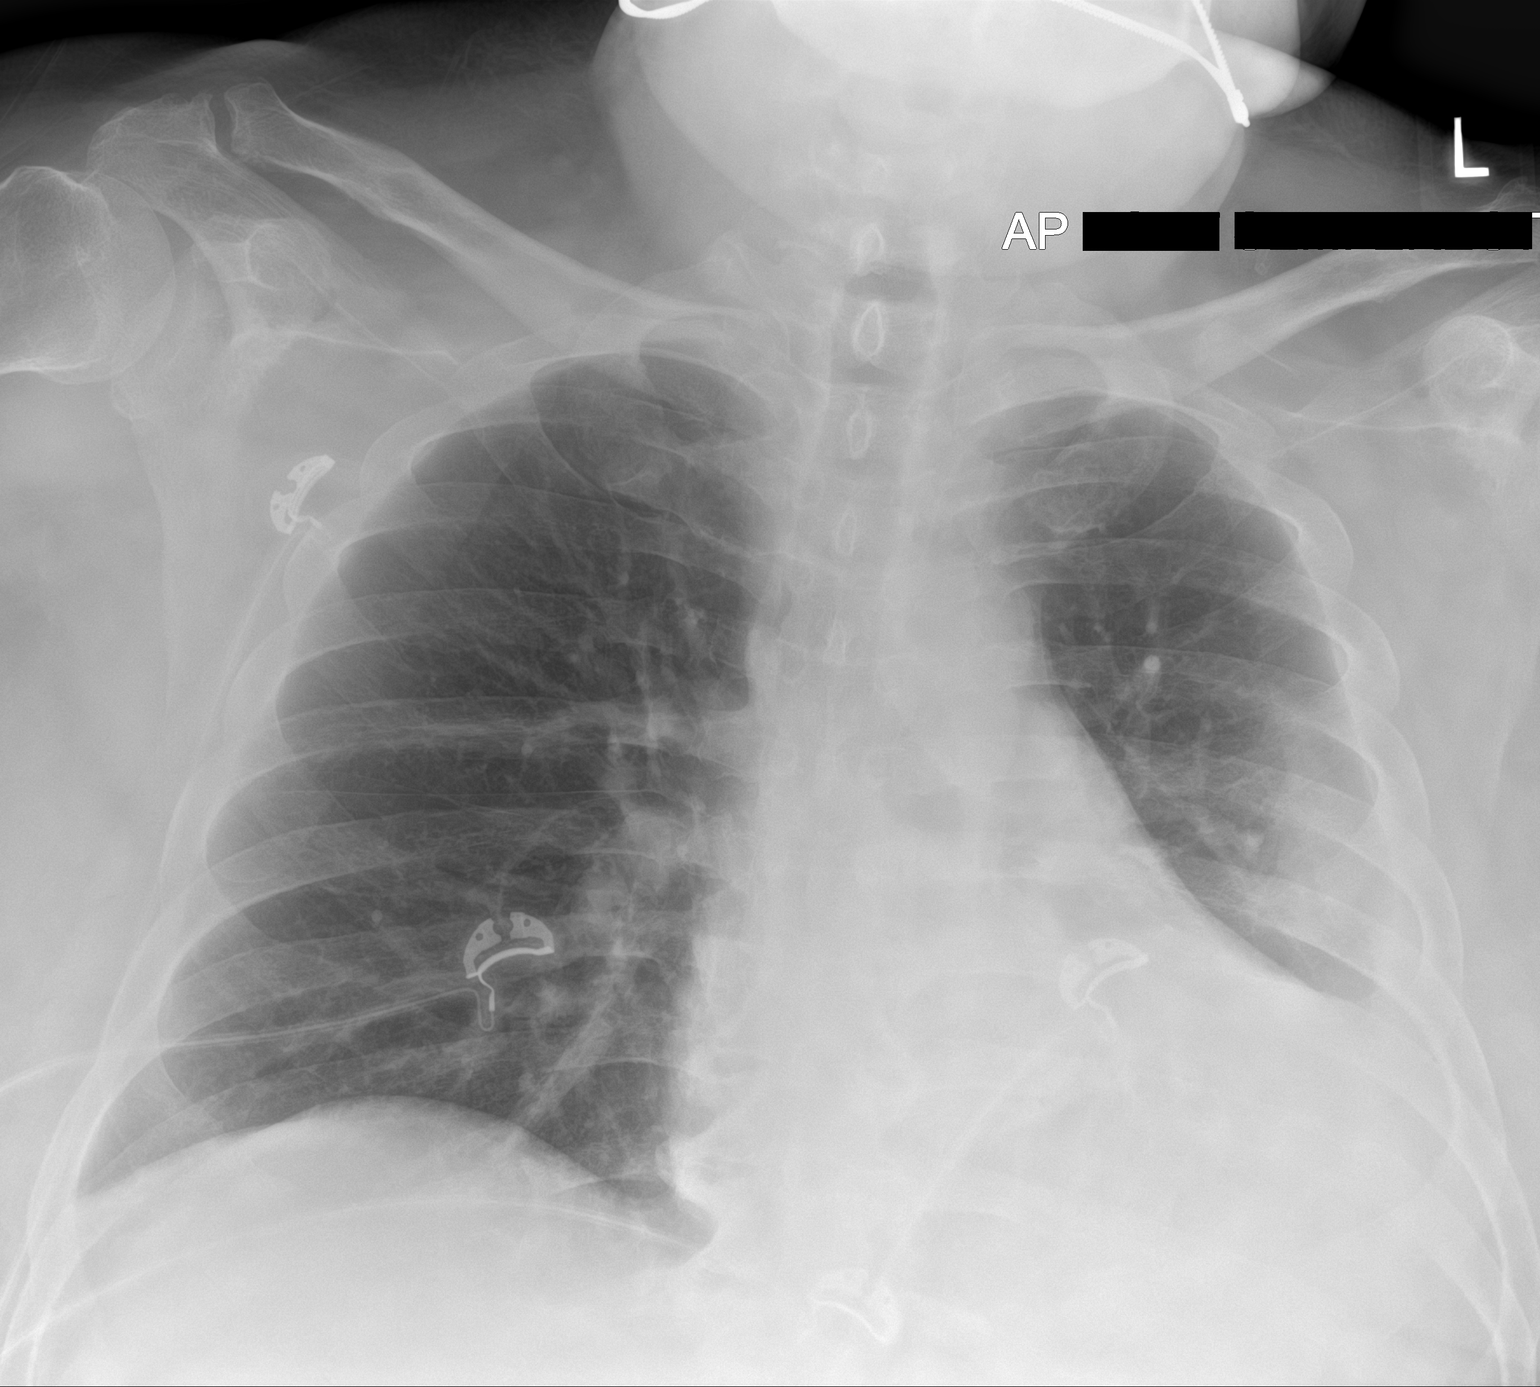

[1 of 1 positions shown; findings below may reference images not displayed]

FINDINGS: Mildly degraded exam due to AP portable technique and patient body
habitus. Midline trachea. Cardiomegaly accentuated by AP portable
technique. Pleuroparenchymal opacification within the inferior left
hemithorax is chronic. Right lung is clear. No congestive failure.
IMPRESSION: Chronic pleuroparenchymal opacification within the inferior left
hemithorax. Most likely due to pleural thickening and scarring in
the setting of prior pleural fluid. Superimposed pleural effusion
and left lower lobe airspace disease cannot be excluded.

Decreased sensitivity and specificity exam due to technique related
factors, as described above.

Cardiomegaly without congestive failure.

## 2020-11-27 MED ORDER — POTASSIUM CHLORIDE CRYS ER 20 MEQ PO TBCR
20.0000 meq | EXTENDED_RELEASE_TABLET | Freq: Once | ORAL | Status: AC
  Administered 2020-11-27: 20 meq via ORAL
  Filled 2020-11-27: qty 1

## 2020-11-27 MED ORDER — AMLODIPINE BESYLATE 5 MG PO TABS
5.0000 mg | ORAL_TABLET | Freq: Once | ORAL | Status: AC
  Administered 2020-11-27: 5 mg via ORAL
  Filled 2020-11-27: qty 1

## 2020-11-27 NOTE — ED Provider Notes (Addendum)
[facility]ERGENCY DEPARTMENT Provider Note   CSN: [id number] Arrival date & time: 11/27/20  1453     History Chief Complaint  Patient presents with  . Loss of Consciousness    Michael Riggs is a 50 y.o. male.  HPI 50 year old male with ESRD on hemodialysis MWF, DM type II, hypertension, stroke, anemia, left BKA presents to the ER with complaints of syncopal episode during dialysis.  Patient states that he was at about 4-1/2 hours out of 5 in his dialysis process when he suddenly lost consciousness for approximately 30 seconds.  He denies any chest pain, dizziness, prodrome leading up to the syncope.  He states that afterwards he when he woke up he felt short of breath and diaphoretic, however this shortly improved.  He was given some IV fluids and did feel better.  He states that he feels well and back to baseline now, but states that this is never happened to him before during dialysis and he wanted to be evaluated.  He denies any back pain, shortness of breath, pleuritic symptoms, chest pain currently.    Past Medical History:  Diagnosis Date  . Anemia   . Anemia of chronic disease   . Anxiety   . ARF (acute renal failure) ([place]) 01/30/2019  . Calciphylaxis   . Controlled type 2 diabetes mellitus with hyperglycemia, without long-term current use of insulin ([place])   . Debility 02/09/2019  . Depression   . Diabetes mellitus type 2 in obese ([place]) 07/04/2013  . Diabetes mellitus with peripheral vascular disease (HCC)    type 2 no meds, lost 100 lbs  . Dyslipidemia   . Dyspnea    inhaler  . End stage renal disease ([place])   . ESRD (end stage renal disease) on dialysis ([place]) 01/2019   MWFS  . ESRD on dialysis ([place])   . Essential hypertension   . Fluid overload 02/04/2019  . GERD (gastroesophageal reflux disease)   . Goals of care, counseling/discussion   . Habitual alcohol use 07/04/2013  . Headache   . Hyperkalemia 01/30/2019  . Hypertension   .  Hypoglycemia 01/30/2019  . Hypokalemia 01/30/2019  . Labile blood pressure   . Leukocytosis   . Lower GI bleed 05/14/2019  . Macrocytic anemia 01/30/2019  . Obesity, Class II, BMI 35-39.9, with comorbidity 07/04/2013  . Palliative care encounter   . PDR (proliferative diabetic retinopathy) ([place]) 12/14/2013  . Physical deconditioning   . Pressure injury of skin 04/27/2019  . Pruritus   . Scrotal edema   . Scrotal pain   . Sleep apnea    uses CPAP  . Sleep disturbance   . Slow transit constipation   . Status post below-knee amputation of left lower extremity ([place])   . Stroke [facility])    mini -shown on CT scan  . Syphilis 01/2019  . Urinary retention   . Venous hypertension 09/22/2013  . Wound infection 03/28/2019    Patient Active Problem List   Diagnosis Date Noted  . Personal history of COVID-19 09/23/2020  . COVID-19 09/11/2020  . Acute respiratory failure with hypoxia ([place]) 11/06/2019  . CAP (community acquired pneumonia) 10/11/2019  . Generalized hyperhidrosis 10/11/2019  . Allergy, unspecified, initial encounter 10/03/2019  . Diarrhea, unspecified 07/24/2019  . Lower GI bleed 05/14/2019  . Pressure injury of skin 04/27/2019  . Admission for palliative care 04/27/2019  . Major depressive disorder, single episode, unspecified 04/27/2019  . Other disorders of calcium metabolism 04/27/2019  . Palliative  care encounter   . Goals of care, counseling/discussion   . Calciphylaxis   . Wound infection 03/28/2019  . Hypertension   . Dyslipidemia   . Diabetes mellitus with peripheral vascular disease ([place])   . Depression   . Physical deconditioning   . Coagulation defect, unspecified ([place]) 03/15/2019  . Other malaise 03/14/2019  . Other specified disorders of the male genital organs 03/14/2019  . Other specified symptoms and signs involving the circulatory and respiratory systems 03/14/2019  . Unspecified protein-calorie malnutrition ([place]) 03/14/2019  . Pruritus   . Scrotal pain    . Labile blood pressure   . Scrotal edema   . Status post below-knee amputation of left lower extremity ([place])   . Slow transit constipation   . Leukocytosis   . Sleep disturbance   . Essential hypertension   . Anemia of chronic disease   . Controlled type 2 diabetes mellitus with hyperglycemia, without long-term current use of insulin ([place])   . ESRD on dialysis ([place])   . Urinary retention   . Debility 02/09/2019  . Fluid overload 02/04/2019  . Dyspnea, unspecified 02/03/2019  . Gastro-esophageal reflux disease without esophagitis 02/03/2019  . Hyperlipidemia, unspecified 02/03/2019  . Iron deficiency anemia, unspecified 02/03/2019  . Major depressive disorder, recurrent, in partial remission ([place]) 02/03/2019  . Other problems related to lifestyle 02/03/2019  . Pain in right leg 02/03/2019  . Pain, unspecified 02/03/2019  . Psychophysiologic insomnia 02/03/2019  . Secondary hyperparathyroidism of renal origin ([place]) 02/03/2019  . Testicular hypofunction 02/03/2019  . Hyperkalemia 01/30/2019  . ARF (acute renal failure) ([place]) 01/30/2019  . Hypokalemia 01/30/2019  . Hypoglycemia 01/30/2019  . Syphilis 01/30/2019  . Macrocytic anemia 01/30/2019  . ESRD (end stage renal disease) ([place])   . ESRD (end stage renal disease) on dialysis ([place]) 01/2019  . PDR (proliferative diabetic retinopathy) ([place]) 12/14/2013  . Venous hypertension 09/22/2013  . Acquired absence of left leg below knee ([place]) 07/20/2013  . Diabetes mellitus type 2 in obese ([place]) 07/04/2013  . Habitual alcohol use 07/04/2013  . Obesity, Class II, BMI 35-39.9, with comorbidity 07/04/2013    Past Surgical History:  Procedure Laterality Date  . AV FISTULA PLACEMENT Left 01/09/2019   Procedure: ARTERIOVENOUS (AV) FISTULA CREATION LEFT UPPER ARM;  Surgeon: [name], MD;  Location: [facility];  Service: Vascular;  Laterality: Left;  . BASCILIC VEIN TRANSPOSITION Left 07/06/2019   Procedure: BASILIC VEIN TRANSPOSITION SECOND  STAGE LEFT ARM;  Surgeon: [name], MD;  Location: [facility];  Service: Vascular;  Laterality: Left;  . below the knee amputation Left   . EYE SURGERY Bilateral   . FLEXIBLE SIGMOIDOSCOPY N/A 05/15/2019   Procedure: FLEXIBLE SIGMOIDOSCOPY;  Surgeon: Rush Landmark Telford Nab., MD;  Location: [facility];  Service: Gastroenterology;  Laterality: N/A;  . HEMOSTASIS CLIP PLACEMENT  05/15/2019   Procedure: HEMOSTASIS CLIP PLACEMENT;  Surgeon: Irving Copas., MD;  Location: [facility];  Service: Gastroenterology;;  . HOT HEMOSTASIS N/A 05/15/2019   Procedure: HOT HEMOSTASIS (ARGON PLASMA COAGULATION/BICAP);  Surgeon: Irving Copas., MD;  Location: [facility];  Service: Gastroenterology;  Laterality: N/A;  . IR FLUORO GUIDE CV LINE RIGHT  01/31/2019  . IR FLUORO GUIDE CV LINE RIGHT  02/03/2019  . IR THORACENTESIS ASP PLEURAL SPACE W/IMG GUIDE  11/08/2019  . IR US GUIDE VASC ACCESS RIGHT  01/31/2019  . IR US GUIDE VASC ACCESS RIGHT  02/03/2019  . SCLEROTHERAPY  05/15/2019   Procedure: Clide Deutscher;  Surgeon: Justice Britain  Brooke Bonito., MD;  Location[facility] ENDOSCOPY;  Service: Gastroenterology;;       Family History  Problem Relation Age of Onset  . Diabetes Mother   . Multiple myeloma Mother     Social History   Tobacco Use  . Smoking status: Former Smoker    Types: Cigarettes    Start date: 11/18/2018    Quit date: 01/18/2019    Years since quitting: 1.8  . Smokeless tobacco: Never Used  . Tobacco comment: pt stated got bored with it  Vaping Use  . Vaping Use: Never used  Substance Use Topics  . Alcohol use: Not Currently    Comment: heavy drinker in the past, none since 07/19/18  . Drug use: Not Currently    Home Medications Prior to Admission medications   Medication Sig Start Date End Date Taking? Authorizing Provider  acetaminophen (TYLENOL) 325 MG tablet Take 2 tablets (650 mg total) by mouth every 6 (six) hours as needed for mild pain (or Fever >/= 101). 03/13/19    Angiulli, Lavon Paganini, PA-C  albuterol (VENTOLIN HFA) 108 (90 Base) MCG/ACT inhaler Inhale 2 puffs into the lungs every 6 (six) hours as needed for wheezing or shortness of breath.    [provider]  amLODipine (NORVASC) 5 MG tablet Take 5 mg by mouth at bedtime.     [provider]  atorvastatin (LIPITOR) 40 MG tablet Take 1 tablet (40 mg total) by mouth daily. Patient taking differently: Take 40 mg by mouth at bedtime. 03/13/19   Angiulli, Lavon Paganini, PA-C  buPROPion (WELLBUTRIN XL) 300 MG 24 hr tablet Take 300 mg by mouth at bedtime.  09/27/19   [provider]  clotrimazole-betamethasone (LOTRISONE) cream Apply topically. 09/05/20   [provider]  collagenase (SANTYL) ointment Apply 1 application topically daily. Apply to the affected area daily plus dry dressing 07/18/19   Dondra Prader R, NP  diclofenac Sodium (VOLTAREN) 1 % GEL Apply 2 g topically 4 (four) times daily. 10/12/19   Geradine Girt, DO  ferric citrate (AURYXIA) 1 GM 210 MG(Fe) tablet Take by mouth. 07/26/20   [provider]  Hydrocortisone (GERHARDT'S BUTT CREAM) CREA Apply 1 application topically 3 (three) times daily as needed for irritation. 04/27/19   Dessa Phi, DO  ipratropium-albuterol (DUONEB) 0.5-2.5 (3) MG/3ML SOLN Take 3 mLs by nebulization every 6 (six) hours as needed (For shortness of breath). Patient not taking: No sig reported    [provider]  LOPERAMIDE HCL PO Take by mouth. 09/02/20 09/01/21  [provider]  Melatonin 5 MG TABS Take 5 mg by mouth at bedtime as needed (sleep).    [provider]  midodrine (PROAMATINE) 10 MG tablet Take by mouth. 09/17/20   [provider]  mupirocin ointment (BACTROBAN) 2 % Apply to right great toe once daily. 01/18/20 01/17/21  Marzetta Board, DPM  oxyCODONE (OXY IR/ROXICODONE) 5 MG immediate release tablet Take 5 mg by mouth 2 (two) times daily as needed for moderate pain.  10/19/19   [provider]  oxycodone (OXY-IR) 5 MG capsule Take 1 capsule (5 mg total) by mouth every 6 (six) hours as needed. 07/06/19   Dagoberto Ligas, PA-C  pantoprazole (PROTONIX) 40 MG tablet Take 1 tablet (40 mg total) by mouth daily. Patient taking differently: Take 40 mg by mouth at bedtime. 03/13/19   Angiulli, Lavon Paganini, PA-C  sucroferric oxyhydroxide (VELPHORO) 500 MG chewable tablet Chew 1,000 mg by mouth 3 (three) times daily with  meals.    [provider]  tamsulosin (FLOMAX) 0.4 MG CAPS capsule Take 1 capsule (0.4 mg total) by mouth daily after breakfast. Patient taking differently: Take 0.4 mg by mouth at bedtime. 03/13/19   Angiulli, Lavon Paganini, PA-C  traMADol (ULTRAM) 50 MG tablet Take 1 tablet (50 mg total) by mouth every 8 (eight) hours as needed for moderate pain. 05/18/19   Mercy Riding, MD    Allergies    Patient has no known allergies.  Review of Systems   Review of Systems  Constitutional: Negative for chills and fever.  HENT: Negative for ear pain and sore throat.   Eyes: Negative for pain and visual disturbance.  Respiratory: Negative for cough and shortness of breath.   Cardiovascular: Negative for chest pain and palpitations.  Gastrointestinal: Negative for abdominal pain and vomiting.  Genitourinary: Negative for dysuria and hematuria.  Musculoskeletal: Negative for arthralgias and back pain.  Skin: Negative for color change and rash.  Neurological: Positive for syncope. Negative for seizures.  All other systems reviewed and are negative.   Physical Exam Updated Vital Signs BP (!) 197/105   Pulse 89   Temp 97.7 F (36.5 C) (Oral)   Resp 16   Ht '5\' 10"'  (1.778 m)   Wt 96 kg   SpO2 99%   BMI 30.37 kg/m   Physical Exam Vitals and nursing note reviewed.  Constitutional:      General: He is not in acute distress.    Appearance: He is well-developed. He is not ill-appearing, toxic-appearing or diaphoretic.  HENT:     Head: Normocephalic and  atraumatic.  Eyes:     Conjunctiva/sclera: Conjunctivae normal.  Cardiovascular:     Rate and Rhythm: Normal rate and regular rhythm.     Heart sounds: No murmur heard.   Pulmonary:     Effort: Pulmonary effort is normal. No respiratory distress.     Breath sounds: Normal breath sounds.  Abdominal:     Palpations: Abdomen is soft.     Tenderness: There is no abdominal tenderness.  Musculoskeletal:        General: Normal range of motion.     Cervical back: Neck supple.     Right lower leg: No edema.     Left lower leg: No edema.     Comments: Left BKA.  Left arm fistula with palpable thrill.  No surrounding erythema, fluctuance  Skin:    General: Skin is warm and dry.  Neurological:     General: No focal deficit present.     Mental Status: He is alert and oriented to person, place, and time.     Comments: Mental Status:  Alert, thought content appropriate, able to give a coherent history. Speech fluent without evidence of aphasia. Able to follow 2 step commands without difficulty.  Cranial Nerves:  II: Peripheral visual fields grossly normal, pupils equal, round, reactive to light III,IV, VI: ptosis not present, extra-ocular motions intact bilaterally  V,VII: smile symmetric, facial light touch sensation equal VIII: hearing grossly normal to voice  X: uvula elevates symmetrically  XI: bilateral shoulder shrug symmetric and strong XII: midline tongue extension without fassiculations Motor:  Normal tone. 5/5 strength of BUE and BLE major muscle groups including strong and equal grip strength and dorsiflexion/plantar flexion Sensory: light touch normal in all extremities. Cerebellar: normal finger-to-nose with bilateral upper extremities, Romberg sign absent Gait:  Not accessed   Psychiatric:        Mood and Affect: Mood normal.  Behavior: Behavior normal.     ED Results / Procedures / Treatments   Labs (all labs ordered are listed, but only abnormal results are  displayed) Labs Reviewed  CBC WITH DIFFERENTIAL/PLATELET - Abnormal; Notable for the following components:      Result Value   RBC 3.11 (*)    Hemoglobin 9.9 (*)    HCT 31.0 (*)    All other components within normal limits  COMPREHENSIVE METABOLIC PANEL - Abnormal; Notable for the following components:   Potassium 3.0 (*)    Glucose, Bld 104 (*)    Creatinine, Ser 3.77 (*)    Calcium 7.3 (*)    Total Protein 6.3 (*)    Albumin 2.9 (*)    AST 10 (*)    GFR, Estimated 19 (*)    All other components within normal limits  MAGNESIUM  PHOSPHORUS    EKG EKG Interpretation  Date/Time:  Wednesday Nov 27 2020 15:19:25 EDT Ventricular Rate:  83 PR Interval:  166 QRS Duration: 104 QT Interval:  445 QTC Calculation: 523 R Axis:   94 Text Interpretation: Sinus rhythm Borderline right axis deviation Minimal ST depression, diffuse leads Prolonged QT interval No acute changes Confirmed by Varney Biles ([id number]) on 11/27/2020 4:18:23 PM   Radiology DG Chest Portable 1 View  Result Date: 11/27/2020 CLINICAL DATA:  Syncope EXAM: PORTABLE CHEST 1 VIEW COMPARISON:  11/08/2019 FINDINGS: Mildly degraded exam due to AP portable technique and patient body habitus. Midline trachea. Cardiomegaly accentuated by AP portable technique. Pleuroparenchymal opacification within the inferior left hemithorax is chronic. Right lung is clear. No congestive failure. IMPRESSION: Chronic pleuroparenchymal opacification within the inferior left hemithorax. Most likely due to pleural thickening and scarring in the setting of prior pleural fluid. Superimposed pleural effusion and left lower lobe airspace disease cannot be excluded. Decreased sensitivity and specificity exam due to technique related factors, as described above. Cardiomegaly without congestive failure. Electronically Signed   By: Abigail Miyamoto M.D.   On: 11/27/2020 16:16    Procedures Procedures   Medications Ordered in ED Medications  potassium  chloride SA (KLOR-CON) CR tablet 20 mEq (20 mEq Oral Given 11/27/20 1918)  amLODipine (NORVASC) tablet 5 mg (5 mg Oral Given 11/27/20 1918)    ED Course  I have reviewed the triage vital signs and the nursing notes.  Pertinent labs & imaging results that were available during my care of the patient were reviewed by me and considered in my medical decision making (see chart for details).    MDM Rules/Calculators/A&P                         50 year old male who presents to the ER with a syncopal episode during dialysis.  On arrival, he is well-appearing, no acute distress, resting comfortably in the ER bed.  He has no complaints at this time.  Denying any chest pain, shortness of breath, dizziness.  He states that he had never syncopized during dialysis before and did want to be evaluated.  On arrival, his blood pressure is elevated at 12/19/1966/94, however not tachycardic, tachypneic or hypoxic.  Sickle exam largely reassuring.  DDx includes syncope due to dehydration from dialysis, PE, electrolyte abnormality, torsades, ACS  Labs and imaging ordered, reviewed and interpreted by me.  CBC without leukocytosis, hemoglobin 9.9 which appears to be stable.  CMP with mild hypokalemia of 3, however no other significant abnormalities noted.  Creatinine consistent with ESRD.  Magnesium and phosphorus are  normal.  Chest x-ray without any mediastinal widening, signs of infection or any other intrathoracic abnormality.  EKG with prolonged QT however no significant changes from prior.  Work-up reassuring.  Patient feels well, is asking for food.  He is hypertensive here in the ER but has not taken his nighttime Norvasc.  He was given a dose here in the ER.  No signs of hypertensive urgency/emergency.  Low suspicion for PE, dissection.  He is requesting to go home.  I think this is reasonable. Referral to cardiology provided given long QT. Driving precautions given.  We discussed PCP follow-up, return precautions.   He voiced understanding and is agreeable.  Stable for discharge  This was a shared visit with my supervising physician Dr. Kathrynn Humble who independently saw and evaluated the patient & provided guidance in evaluation/management/disposition ,in agreement with care   Final Clinical Impression(s) / ED Diagnoses Final diagnoses:  Syncope, unspecified syncope type  Prolonged QT interval  Hypertension, unspecified type    Rx / DC Orders ED Discharge Orders    None          Garald Balding, PA-C 11/27/20 Sonia Baller, MD 11/28/20 1856

## 2020-11-27 NOTE — ED Notes (Signed)
IV team at bedside 

## 2020-11-27 NOTE — ED Triage Notes (Signed)
Was at dialysis today and was in for about 4 1/2 hours of his 5 hour treatment when he had a loss of consciousness. Patient states that he is feeling better now after some IV fluids. Denies any prior history of LOC.

## 2020-11-27 NOTE — Progress Notes (Signed)
Deaccessed fistula and pinpoint pressure applied. No further bleeding noted. Pressure dressing applied and secured with medipore. Pt immediately OOB for discharge.Educated pt on the risks of bleeding after deaccessing fistula Verbalized understanding via teach back method on s/s bleeding and applying pressure to site if bleeding begins.

## 2020-11-27 NOTE — Discharge Instructions (Signed)
For your fainting spell, please see the Cardiologist as requested. Return to the ER if you have chest pain, palpitations, shortness of breath, near fainting/dizzy spells.  [place] law prevents people with seizures or fainting from driving or operating dangerous machinery until they are free of seizures or fainting for 6 months.

## 2020-11-27 NOTE — ED Notes (Signed)
Followed up with IV team for deaccess.

## 2020-12-05 ENCOUNTER — Other Ambulatory Visit: Payer: Self-pay

## 2020-12-05 ENCOUNTER — Ambulatory Visit (INDEPENDENT_AMBULATORY_CARE_PROVIDER_SITE_OTHER): Payer: Medicare Other | Admitting: Endocrinology

## 2020-12-05 DIAGNOSIS — E221 Hyperprolactinemia: Secondary | ICD-10-CM | POA: Diagnosis not present

## 2020-12-05 LAB — PROLACTIN: Prolactin: 25.6 ng/mL — ABNORMAL HIGH (ref 2.0–18.0)

## 2020-12-05 LAB — T4, FREE: Free T4: 0.88 ng/dL (ref 0.60–1.60)

## 2020-12-05 LAB — TSH: TSH: 3.12 u[IU]/mL (ref 0.35–4.50)

## 2020-12-05 NOTE — Patient Instructions (Addendum)
Blood tests are requested for you today.  We'll let you know about the results.   If the prolactin is high, I'll send a prescription to your pharmacy for this.

## 2020-12-05 NOTE — Progress Notes (Signed)
Subjective:    [name], male    DOB: [birth year], 50 y.o.   MRN: [id number]  HPI Pt is referred by Dr Nila Nephew, for hyperprolactinemia. he was noted to have an elevated prolactin.  he denies the following: excessive exercise, antipsychotics, brain XRT, brain surgery, cirrhosis,  thyroid dz, seizures, infertility, seizures,zoster, and brain injury.  Pt reports he had puberty at the normal age.  He has no biological children.   He has never had pituitary imaging. He has never been on any prescribed medication for hypogonadism.  He does not take antiandrogens or opioids.  He has h/o infertility, but he is not interested in fertility now. He has no h/o DVT.  He has h/o alcoholism.   Past Medical History:  Diagnosis Date  . Anemia   . Anemia of chronic disease   . Anxiety   . ARF (acute renal failure) ([place]) 01/30/2019  . Calciphylaxis   . Controlled type 2 diabetes mellitus with hyperglycemia, without long-term current use of insulin ([place])   . Debility 02/09/2019  . Depression   . Diabetes mellitus type 2 in obese ([place]) 07/04/2013  . Diabetes mellitus with peripheral vascular disease (HCC)    type 2 no meds, lost 100 lbs  . Dyslipidemia   . Dyspnea    inhaler  . End stage renal disease ([place])   . ESRD (end stage renal disease) on dialysis ([place]) 01/2019   MWFS  . ESRD on dialysis ([place])   . Essential hypertension   . Fluid overload 02/04/2019  . GERD (gastroesophageal reflux disease)   . Goals of care, counseling/discussion   . Habitual alcohol use 07/04/2013  . Headache   . Hyperkalemia 01/30/2019  . Hypertension   . Hypoglycemia 01/30/2019  . Hypokalemia 01/30/2019  . Labile blood pressure   . Leukocytosis   . Lower GI bleed 05/14/2019  . Macrocytic anemia 01/30/2019  . Obesity, Class II, BMI 35-39.9, with comorbidity 07/04/2013  . Palliative care encounter   . PDR (proliferative diabetic retinopathy) ([place]) 12/14/2013  . Physical deconditioning   . Pressure injury  of skin 04/27/2019  . Pruritus   . Scrotal edema   . Scrotal pain   . Sleep apnea    uses CPAP  . Sleep disturbance   . Slow transit constipation   . Status post below-knee amputation of left lower extremity ([place])   . Stroke [facility])    mini -shown on CT scan  . Syphilis 01/2019  . Urinary retention   . Venous hypertension 09/22/2013  . Wound infection 03/28/2019    Past Surgical History:  Procedure Laterality Date  . AV FISTULA PLACEMENT Left 01/09/2019   Procedure: ARTERIOVENOUS (AV) FISTULA CREATION LEFT UPPER ARM;  Surgeon: [name], MD;  Location: [facility];  Service: Vascular;  Laterality: Left;  . BASCILIC VEIN TRANSPOSITION Left 07/06/2019   Procedure: BASILIC VEIN TRANSPOSITION SECOND STAGE LEFT ARM;  Surgeon: [name], MD;  Location[facility];  Service: Vascular;  Laterality: Left;  . below the knee amputation Left   . EYE SURGERY Bilateral   . FLEXIBLE SIGMOIDOSCOPY N/A 05/15/2019   Procedure: FLEXIBLE SIGMOIDOSCOPY;  Surgeon: Rush Landmark Telford Nab., MD;  Location: [facility];  Service: Gastroenterology;  Laterality: N/A;  . HEMOSTASIS CLIP PLACEMENT  05/15/2019   Procedure: HEMOSTASIS CLIP PLACEMENT;  Surgeon: Irving Copas., MD;  Location: [facility];  Service: Gastroenterology;;  . HOT HEMOSTASIS N/A 05/15/2019   Procedure: HOT HEMOSTASIS (ARGON PLASMA COAGULATION/BICAP);  Surgeon:  Mansouraty, Telford Nab., MD;  Location: [facility];  Service: Gastroenterology;  Laterality: N/A;  . IR FLUORO GUIDE CV LINE RIGHT  01/31/2019  . IR FLUORO GUIDE CV LINE RIGHT  02/03/2019  . IR THORACENTESIS ASP PLEURAL SPACE W/IMG GUIDE  11/08/2019  . IR US GUIDE VASC ACCESS RIGHT  01/31/2019  . IR US GUIDE VASC ACCESS RIGHT  02/03/2019  . SCLEROTHERAPY  05/15/2019   Procedure: Clide Deutscher;  Surgeon: Mansouraty, Telford Nab., MD;  Location: [facility] ENDOSCOPY;  Service: Gastroenterology;;    Social History   Socioeconomic History  . Marital status: Divorced    Spouse name: Not on  file  . Number of children: Not on file  . Years of education: Not on file  . Highest education level: Not on file  Occupational History  . Not on file  Tobacco Use  . Smoking status: Former Smoker    Types: Cigarettes    Start date: 11/18/2018    Quit date: 01/18/2019    Years since quitting: 1.8  . Smokeless tobacco: Never Used  . Tobacco comment: pt stated got bored with it  Vaping Use  . Vaping Use: Never used  Substance and Sexual Activity  . Alcohol use: Not Currently    Comment: heavy drinker in the past, none since 07/19/18  . Drug use: Not Currently  . Sexual activity: Not Currently  Other Topics Concern  . Not on file  Social History Narrative  . Not on file   Social Determinants of Health   Financial Resource Strain: Not on file  Food Insecurity: Not on file  Transportation Needs: Not on file  Physical Activity: Not on file  Stress: Not on file  Social Connections: Not on file  Intimate Partner Violence: Not on file    Current Outpatient Medications on File Prior to Visit  Medication Sig Dispense Refill  . acetaminophen (TYLENOL) 325 MG tablet Take 2 tablets (650 mg total) by mouth every 6 (six) hours as needed for mild pain (or Fever >/= 101).    Marland Kitchen albuterol (VENTOLIN HFA) 108 (90 Base) MCG/ACT inhaler Inhale 2 puffs into the lungs every 6 (six) hours as needed for wheezing or shortness of breath.    Marland Kitchen amLODipine (NORVASC) 5 MG tablet Take 5 mg by mouth at bedtime.     Marland Kitchen atorvastatin (LIPITOR) 40 MG tablet Take 1 tablet (40 mg total) by mouth daily. (Patient taking differently: Take 40 mg by mouth at bedtime.)    . buPROPion (WELLBUTRIN XL) 300 MG 24 hr tablet Take 300 mg by mouth at bedtime.     . clotrimazole-betamethasone (LOTRISONE) cream Apply topically.    . collagenase (SANTYL) ointment Apply 1 application topically daily. Apply to the affected area daily plus dry dressing 90 g 3  . diclofenac Sodium (VOLTAREN) 1 % GEL Apply 2 g topically 4 (four) times  daily. 100 g 0  . ferric citrate (AURYXIA) 1 GM 210 MG(Fe) tablet Take by mouth.    . Hydrocortisone (GERHARDT'S BUTT CREAM) CREA Apply 1 application topically 3 (three) times daily as needed for irritation. 1 each 0  . ipratropium-albuterol (DUONEB) 0.5-2.5 (3) MG/3ML SOLN Take 3 mLs by nebulization every 6 (six) hours as needed (For shortness of breath).    . LOPERAMIDE HCL PO Take by mouth.    . Melatonin 5 MG TABS Take 5 mg by mouth at bedtime as needed (sleep).    . midodrine (PROAMATINE) 10 MG tablet Take by mouth.    . mupirocin  ointment (BACTROBAN) 2 % Apply to right great toe once daily. 30 g 1  . oxyCODONE (OXY IR/ROXICODONE) 5 MG immediate release tablet Take 5 mg by mouth 2 (two) times daily as needed for moderate pain.     Marland Kitchen oxycodone (OXY-IR) 5 MG capsule Take 1 capsule (5 mg total) by mouth every 6 (six) hours as needed. 20 capsule 0  . pantoprazole (PROTONIX) 40 MG tablet Take 1 tablet (40 mg total) by mouth daily. (Patient taking differently: Take 40 mg by mouth at bedtime.)    . sucroferric oxyhydroxide (VELPHORO) 500 MG chewable tablet Chew 1,000 mg by mouth 3 (three) times daily with meals.    . tamsulosin (FLOMAX) 0.4 MG CAPS capsule Take 1 capsule (0.4 mg total) by mouth daily after breakfast. (Patient taking differently: Take 0.4 mg by mouth at bedtime.) 30 capsule   . traMADol (ULTRAM) 50 MG tablet Take 1 tablet (50 mg total) by mouth every 8 (eight) hours as needed for moderate pain. 15 tablet 0   No current facility-administered medications on file prior to visit.    No Known Allergies  Family History  Problem Relation Age of Onset  . Diabetes Mother   . Multiple myeloma Mother     BP 136/80 (BP Location: Right Arm, Patient Position: Sitting, Cuff Size: Normal)   Pulse 93   Ht '5\' 10"'  (1.778 m)   Wt 224 lb 12.8 oz (102 kg)   SpO2 96%   BMI 32.26 kg/m    Review of Systems denies polyuria, visual loss, weight change, and n/v.  He has intermitt HA.  He has  lost 135 lbs x 6 months (pt says due to illness).  He has ED sxs    Objective:   Physical Exam VS: see vs page GEN: no distress HEAD: head: no deformity eyes: no periorbital swelling, no proptosis external nose and ears are normal NECK: supple, thyroid is not enlarged CHEST WALL: no deformity BREASTS: bilat pseudogynecomastia LUNGS: clear to auscultation CV: reg rate and rhythm, no murmur.  MUSCULOSKELETAL: gait is normal and steady EXTEMITIES: left BKA  No right leg edema NEURO:  readily moves all 4's.  sensation is intact to touch on all 4's SKIN:  Normal texture and temperature.  No rash or suspicious lesion is visible.   NODES:  None palpable at the neck PSYCH: alert, well-oriented.  Does not appear anxious nor depressed.  CT (2020): no mention is made of the pituitary.    I have reviewed outside records, and summarized: Pt was noted to have elevated A1c, and referred here.  He also was noted to have calciphylaxis of the LEs     Assessment & Plan:  Hyperprolactinemia, new to me. Calciphylaxis, unlikely related t the above. ESRD or opiates ED: we discussed.  Pt says he wants to rx prolactin, if it would help ED.    Patient Instructions  Blood tests are requested for you today.  We'll let you know about the results.   If the prolactin is high, I'll send a prescription to your pharmacy for this.

## 2020-12-07 LAB — TESTOSTERONE,FREE AND TOTAL
Testosterone, Free: 11 pg/mL (ref 6.8–21.5)
Testosterone: 444 ng/dL (ref 264–916)

## 2020-12-07 MED ORDER — BROMOCRIPTINE MESYLATE 2.5 MG PO TABS: 2.5000 mg | ORAL_TABLET | Freq: Every day | ORAL | 3 refills | Status: DC

## 2021-01-16 ENCOUNTER — Other Ambulatory Visit: Payer: Self-pay

## 2021-01-16 ENCOUNTER — Ambulatory Visit (INDEPENDENT_AMBULATORY_CARE_PROVIDER_SITE_OTHER): Payer: Medicare Other | Admitting: Podiatry

## 2021-01-16 ENCOUNTER — Encounter: Payer: Self-pay | Admitting: Podiatry

## 2021-01-16 DIAGNOSIS — L84 Corns and callosities: Secondary | ICD-10-CM

## 2021-01-16 DIAGNOSIS — E1142 Type 2 diabetes mellitus with diabetic polyneuropathy: Secondary | ICD-10-CM

## 2021-01-16 DIAGNOSIS — B351 Tinea unguium: Secondary | ICD-10-CM

## 2021-01-16 DIAGNOSIS — Z89512 Acquired absence of left leg below knee: Secondary | ICD-10-CM

## 2021-01-16 NOTE — Progress Notes (Signed)
Subjective: Michael Riggs presents today at risk foot care. Patient has h/o amputation of below knee amputation left lower extremity, diabetes with ESRD on hemodialysis (MWF at [facility], [address] [place], [place], [address]; [phone]).   He is worried he has started developing calluses on the bottom of his right foot  He states his blood glucose was 119 mg/dl on yesterday. He has not checked it today.  [name], MD is patient's PCP. Last visit was: this month per patient. He also sees Dr. Loanne Drilling and last visit was 12/05/2020.  He voices no new pedal concerns on today's visit. 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  Past Medical History:  Diagnosis Date   Anemia    Anemia of chronic disease    Anxiety    ARF (acute renal failure) ([place]) 01/30/2019   Calciphylaxis    Controlled type 2 diabetes mellitus with hyperglycemia, without long-term current use of insulin ([place])    Debility 02/09/2019   Depression    Diabetes mellitus type 2 in obese ([place]) 07/04/2013   Diabetes mellitus with peripheral vascular disease ([place])    type 2 no meds, lost 100 lbs   Dyslipidemia    Dyspnea    inhaler   End stage renal disease ([place])    ESRD (end stage renal disease) on dialysis ([place]) 01/2019   MWFS   ESRD on dialysis [facility])    Essential hypertension    Fluid overload 02/04/2019   GERD (gastroesophageal reflux disease)    Goals of care, counseling/discussion    Habitual alcohol use 07/04/2013   Headache    Hyperkalemia 01/30/2019   Hypertension    Hypoglycemia 01/30/2019   Hypokalemia 01/30/2019   Labile blood pressure    Leukocytosis    Lower GI bleed 05/14/2019   Macrocytic anemia 01/30/2019   Obesity, Class II, BMI 35-39.9, with comorbidity 07/04/2013   Palliative care encounter    PDR (proliferative diabetic retinopathy) ([place]) 12/14/2013   Physical deconditioning    Pressure injury of skin 04/27/2019   Pruritus    Scrotal edema     Scrotal pain    Sleep apnea    uses CPAP   Sleep disturbance    Slow transit constipation    Status post below-knee amputation of left lower extremity ([place])    Stroke ([place])    mini -shown on CT scan   Syphilis 01/2019   Urinary retention    Venous hypertension 09/22/2013   Wound infection 03/28/2019     Patient Active Problem List   Diagnosis Date Noted   Hyperprolactinemia ([place]) 12/05/2020   Personal history of COVID-19 09/23/2020   COVID-19 09/11/2020   Acute respiratory failure with hypoxia ([place]) 11/06/2019   CAP (community acquired pneumonia) 10/11/2019   Generalized hyperhidrosis 10/11/2019   Allergy, unspecified, initial encounter 10/03/2019   Diarrhea, unspecified 07/24/2019   Lower GI bleed 05/14/2019   Pressure injury of skin 04/27/2019   Admission for palliative care 04/27/2019   Major depressive disorder, single episode, unspecified 04/27/2019   Other disorders of calcium metabolism 04/27/2019   Palliative care encounter    Goals of care, counseling/discussion    Calciphylaxis    Wound infection 03/28/2019   Hypertension    Dyslipidemia    Diabetes mellitus with peripheral vascular disease ([place])    Depression    Physical deconditioning    Coagulation defect, unspecified ([place]) 03/15/2019   Other malaise 03/14/2019   Other specified disorders of the male  genital organs 03/14/2019   Other specified symptoms and signs involving the circulatory and respiratory systems 03/14/2019   Unspecified protein-calorie malnutrition ([place]) 03/14/2019   Pruritus    Scrotal pain    Labile blood pressure    Scrotal edema    Status post below-knee amputation of left lower extremity (HCC)    Slow transit constipation    Leukocytosis    Sleep disturbance    Essential hypertension    Anemia of chronic disease    Controlled type 2 diabetes mellitus with hyperglycemia, without long-term current use of insulin ([place])    ESRD on dialysis [facility])    Urinary retention    Debility  02/09/2019   Fluid overload 02/04/2019   Dyspnea, unspecified 02/03/2019   Gastro-esophageal reflux disease without esophagitis 02/03/2019   Hyperlipidemia, unspecified 02/03/2019   Iron deficiency anemia, unspecified 02/03/2019   Major depressive disorder, recurrent, in partial remission ([place]) 02/03/2019   Other problems related to lifestyle 02/03/2019   Pain in right leg 02/03/2019   Pain, unspecified 02/03/2019   Psychophysiologic insomnia 02/03/2019   Secondary hyperparathyroidism of renal origin ([place]) 02/03/2019   Testicular hypofunction 02/03/2019   Hyperkalemia 01/30/2019   ARF (acute renal failure) ([place]) 01/30/2019   Hypokalemia 01/30/2019   Hypoglycemia 01/30/2019   Syphilis 01/30/2019   Macrocytic anemia 01/30/2019   ESRD (end stage renal disease) ([place])    ESRD (end stage renal disease) on dialysis ([place]) 01/2019   PDR (proliferative diabetic retinopathy) ([place]) 12/14/2013   Venous hypertension 09/22/2013   Acquired absence of left leg below knee ([place]) 07/20/2013   Diabetes mellitus type 2 in obese ([place]) 07/04/2013   Habitual alcohol use 07/04/2013   Obesity, Class II, BMI 35-39.9, with comorbidity 07/04/2013    Current Outpatient Medications on File Prior to Visit  Medication Sig Dispense Refill   acetaminophen (TYLENOL) 325 MG tablet Take 2 tablets (650 mg total) by mouth every 6 (six) hours as needed for mild pain (or Fever >/= 101).     albuterol (VENTOLIN HFA) 108 (90 Base) MCG/ACT inhaler Inhale 2 puffs into the lungs every 6 (six) hours as needed for wheezing or shortness of breath.     amLODipine (NORVASC) 5 MG tablet Take 5 mg by mouth at bedtime.      atorvastatin (LIPITOR) 40 MG tablet Take 1 tablet (40 mg total) by mouth daily. (Patient taking differently: Take 40 mg by mouth at bedtime.)     bromocriptine (PARLODEL) 2.5 MG tablet Take 1 tablet (2.5 mg total) by mouth at bedtime. 90 tablet 3   buPROPion (WELLBUTRIN XL) 300 MG 24 hr tablet Take 300 mg by mouth  at bedtime.      clotrimazole-betamethasone (LOTRISONE) cream Apply topically.     collagenase (SANTYL) ointment Apply 1 application topically daily. Apply to the affected area daily plus dry dressing 90 g 3   diclofenac Sodium (VOLTAREN) 1 % GEL Apply 2 g topically 4 (four) times daily. 100 g 0   ferric citrate (AURYXIA) 1 GM 210 MG(Fe) tablet Take by mouth.     Hydrocortisone (GERHARDT'S BUTT CREAM) CREA Apply 1 application topically 3 (three) times daily as needed for irritation. 1 each 0   ipratropium-albuterol (DUONEB) 0.5-2.5 (3) MG/3ML SOLN Take 3 mLs by nebulization every 6 (six) hours as needed (For shortness of breath).     LOPERAMIDE HCL PO Take by mouth.     Melatonin 5 MG TABS Take 5 mg by mouth at bedtime as needed (sleep).  midodrine (PROAMATINE) 10 MG tablet Take by mouth.     mupirocin ointment (BACTROBAN) 2 % Apply to right great toe once daily. 30 g 1   oxyCODONE (OXY IR/ROXICODONE) 5 MG immediate release tablet Take 5 mg by mouth 2 (two) times daily as needed for moderate pain.      oxycodone (OXY-IR) 5 MG capsule Take 1 capsule (5 mg total) by mouth every 6 (six) hours as needed. 20 capsule 0   pantoprazole (PROTONIX) 40 MG tablet Take 1 tablet (40 mg total) by mouth daily. (Patient taking differently: Take 40 mg by mouth at bedtime.)     sucroferric oxyhydroxide (VELPHORO) 500 MG chewable tablet Chew 1,000 mg by mouth 3 (three) times daily with meals.     tamsulosin (FLOMAX) 0.4 MG CAPS capsule Take 1 capsule (0.4 mg total) by mouth daily after breakfast. (Patient taking differently: Take 0.4 mg by mouth at bedtime.) 30 capsule    traMADol (ULTRAM) 50 MG tablet Take 1 tablet (50 mg total) by mouth every 8 (eight) hours as needed for moderate pain. 15 tablet 0   No current facility-administered medications on file prior to visit.     No Known Allergies  Objective: Michael Riggs is a pleasant 50 y.o. Hispanic male obese in NAD. AAO x 3.  There were no vitals  filed for this visit.  Vascular Examination: Neurovascular status unchanged right lower extremity. Capillary fill time to digits <3 seconds right lower extremity. Faintly palpable DP pulse(s) right lower extremity. Nonpalpable PT pulse(s) right lower extremity. Pedal hair present. Lower extremity skin temperature gradient within normal limits.  Dermatological Examination: Pedal skin is thin shiny, atrophic right lower extremity.  No interdigital macerations. Toenails 1-5 right elongated, discolored, dystrophic, thickened, and crumbly with subungual debris and tenderness to dorsal palpation. Hyperkeratotic lesion(s) submet head 4 right foot and submet head 5 right foot with tenderness to palpation. No edema, no erythema, no drainage, no fluctuance.   Musculoskeletal: Lower extremity amputation(s): below knee amputation right lower extremity. Utilizing BK prosthesis on today. Weightbearing.  Neurological Examination: Protective sensation diminished with 10 gram monofilament right lower extremity. Vibratory sensation diminished right lower extremity.    Assessment: 1. Onychomycosis   2. Callus   3. Hx of BKA, left ([place])   4. Diabetic polyneuropathy associated with type 2 diabetes mellitus ([place])    Plan: -Examined patient.  -Continue diabetic foot care principles. -Toenails 1-5 right foot debrided in length and girth without iatrogenic bleeding with sterile nail nipper and dremel.  -Calluses pared submetatarsal head(s) 4, 5 right foot utilizing sterile scalpel blade without incident.  -Patient to report any pedal injuries to medical professional immediately. -Patient to continue soft, supportive shoe gear daily. Will start process for diabetic shoes. Schedule with Pedorthist, EJ. -Patient/POA to call should there be question/concern in the interim.  Return in about 9 weeks (around 03/20/2021).  Marzetta Board, DPM

## 2021-02-04 ENCOUNTER — Other Ambulatory Visit: Payer: Self-pay

## 2021-02-04 ENCOUNTER — Ambulatory Visit (INDEPENDENT_AMBULATORY_CARE_PROVIDER_SITE_OTHER): Payer: Medicare Other

## 2021-02-04 DIAGNOSIS — E1142 Type 2 diabetes mellitus with diabetic polyneuropathy: Secondary | ICD-10-CM

## 2021-02-04 NOTE — Progress Notes (Signed)
Patient in office today for diabetic shoe selection and custom inserts. Patient shoe size is a 10.5 wide men's. Doctor treating diabetes is [name], MD. Patient selected shoe [phone] [employer]ages as his 1st shoe option and shoe [id number] [employer] as his 2nd shoe option. Patient advised that the office will call when the shoes are available for pick-up. Patient verbalized understanding.

## 2021-03-18 ENCOUNTER — Encounter: Payer: Self-pay | Admitting: Sports Medicine

## 2021-03-18 ENCOUNTER — Other Ambulatory Visit: Payer: Self-pay

## 2021-03-18 ENCOUNTER — Ambulatory Visit (INDEPENDENT_AMBULATORY_CARE_PROVIDER_SITE_OTHER): Payer: Medicare Other | Admitting: Sports Medicine

## 2021-03-18 DIAGNOSIS — E1142 Type 2 diabetes mellitus with diabetic polyneuropathy: Secondary | ICD-10-CM

## 2021-03-18 DIAGNOSIS — Z89512 Acquired absence of left leg below knee: Secondary | ICD-10-CM

## 2021-03-18 DIAGNOSIS — L97511 Non-pressure chronic ulcer of other part of right foot limited to breakdown of skin: Secondary | ICD-10-CM | POA: Diagnosis not present

## 2021-03-18 DIAGNOSIS — N186 End stage renal disease: Secondary | ICD-10-CM

## 2021-03-18 MED ORDER — CEFUROXIME AXETIL 500 MG PO TABS: 500.0000 mg | ORAL_TABLET | Freq: Two times a day (BID) | ORAL | 0 refills | Status: AC

## 2021-03-18 NOTE — Progress Notes (Signed)
Subjective: Michael Riggs is a 50 y.o. male patient with history of diabetes who returns to office today for wound check, states that he's not exactly sure but on Sunday had bleeding from right 5th toe has been using antibiotic cream and states that he's getting over a kidney infection.   FBS 110 A1c 5.5  Patient denies any other issues.  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   Personal history of COVID-19 09/23/2020   COVID-19 09/11/2020   Acute respiratory failure with hypoxia ([place]) 11/06/2019   CAP (community acquired pneumonia) 10/11/2019   Generalized hyperhidrosis 10/11/2019   Allergy, unspecified, initial encounter 10/03/2019   Diarrhea, unspecified 07/24/2019   Lower GI bleed 05/14/2019   Pressure injury of skin 04/27/2019   Admission for palliative care 04/27/2019   Major depressive disorder, single episode, unspecified 04/27/2019   Other disorders of calcium metabolism 04/27/2019   Palliative care encounter    Goals of care, counseling/discussion    Calciphylaxis    Wound infection 03/28/2019   Hypertension    Dyslipidemia    Diabetes mellitus with peripheral vascular disease ([place])    Depression    Physical deconditioning    Coagulation defect, unspecified ([place]) 03/15/2019   Other malaise 03/14/2019   Other specified disorders of the male genital organs 03/14/2019   Other specified symptoms and signs involving the circulatory and respiratory systems 03/14/2019   Unspecified protein-calorie malnutrition ([place]) 03/14/2019   Pruritus    Scrotal pain    Labile blood pressure    Scrotal edema    Status post below-knee amputation of left lower extremity (HCC)    Slow transit constipation    Leukocytosis    Sleep disturbance    Essential hypertension    Anemia of chronic disease    Controlled type 2 diabetes mellitus with hyperglycemia, without long-term current use of insulin ([place])    ESRD on dialysis [facility])     Urinary retention    Debility 02/09/2019   Fluid overload 02/04/2019   Dyspnea, unspecified 02/03/2019   Gastro-esophageal reflux disease without esophagitis 02/03/2019   Hyperlipidemia, unspecified 02/03/2019   Iron deficiency anemia, unspecified 02/03/2019   Major depressive disorder, recurrent, 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/03/2019   Other problems related to lifestyle 02/03/2019   Pain in right leg 02/03/2019   Pain, unspecified 02/03/2019   Psychophysiologic insomnia 02/03/2019   Secondary hyperparathyroidism of renal origin ([place]) 02/03/2019   Testicular hypofunction 02/03/2019   Hyperkalemia 01/30/2019   ARF (acute renal failure) ([place]) 01/30/2019   Hypokalemia 01/30/2019   Hypoglycemia 01/30/2019   Syphilis 01/30/2019   Macrocytic anemia 01/30/2019   ESRD (end stage renal disease) ([place])    ESRD (end stage renal disease) on dialysis ([place]) 01/2019   PDR (proliferative diabetic retinopathy) ([place]) 12/14/2013   Venous hypertension 09/22/2013   Acquired absence of left leg below knee ([place]) 07/20/2013   Diabetes mellitus type 2 in obese ([place]) 07/04/2013   Habitual alcohol use 07/04/2013   Obesity, Class II, BMI 35-39.9, with comorbidity 07/04/2013   Current Outpatient Medications on File Prior to Visit  Medication Sig Dispense Refill   acetaminophen (TYLENOL) 325 MG tablet Take 2 tablets (650 mg total) by mouth every 6 (six) hours as needed for mild pain (or Fever >/= 101).     albuterol (VENTOLIN HFA) 108 (90 Base) MCG/ACT inhaler Inhale 2 puffs into the lungs every 6 (six) hours as needed for wheezing or shortness of breath.  amLODipine (NORVASC) 5 MG tablet Take 5 mg by mouth at bedtime.      atorvastatin (LIPITOR) 40 MG tablet Take 1 tablet (40 mg total) by mouth daily. (Patient taking differently: Take 40 mg by mouth at bedtime.)     bromocriptine (PARLODEL) 2.5 MG tablet Take 1 tablet (2.5 mg total) by mouth at bedtime. 90 tablet 3   buPROPion (WELLBUTRIN XL) 300 MG 24  hr tablet Take 300 mg by mouth at bedtime.      clotrimazole-betamethasone (LOTRISONE) cream Apply topically.     collagenase (SANTYL) ointment Apply 1 application topically daily. Apply to the affected area daily plus dry dressing 90 g 3   diclofenac Sodium (VOLTAREN) 1 % GEL Apply 2 g topically 4 (four) times daily. 100 g 0   ferric citrate (AURYXIA) 1 GM 210 MG(Fe) tablet Take by mouth.     Hydrocortisone (GERHARDT'S BUTT CREAM) CREA Apply 1 application topically 3 (three) times daily as needed for irritation. 1 each 0   ipratropium-albuterol (DUONEB) 0.5-2.5 (3) MG/3ML SOLN Take 3 mLs by nebulization every 6 (six) hours as needed (For shortness of breath).     LOPERAMIDE HCL PO Take by mouth.     Melatonin 5 MG TABS Take 5 mg by mouth at bedtime as needed (sleep).     midodrine (PROAMATINE) 10 MG tablet Take by mouth.     oxyCODONE (OXY IR/ROXICODONE) 5 MG immediate release tablet Take 5 mg by mouth 2 (two) times daily as needed for moderate pain.      oxycodone (OXY-IR) 5 MG capsule Take 1 capsule (5 mg total) by mouth every 6 (six) hours as needed. 20 capsule 0   pantoprazole (PROTONIX) 40 MG tablet Take 1 tablet (40 mg total) by mouth daily. (Patient taking differently: Take 40 mg by mouth at bedtime.)     sucroferric oxyhydroxide (VELPHORO) 500 MG chewable tablet Chew 1,000 mg by mouth 3 (three) times daily with meals.     tamsulosin (FLOMAX) 0.4 MG CAPS capsule Take 1 capsule (0.4 mg total) by mouth daily after breakfast. (Patient taking differently: Take 0.4 mg by mouth at bedtime.) 30 capsule    traMADol (ULTRAM) 50 MG tablet Take 1 tablet (50 mg total) by mouth every 8 (eight) hours as needed for moderate pain. 15 tablet 0   No current facility-administered medications on file prior to visit.   No Known Allergies  No results found for this or any previous visit (from the past 2160 hour(s)).  Objective: General: Patient is awake, alert, and oriented x 3 and in no acute  distress.  Integument: Skin is warm and dry on right.  Nails are short thickened and dystrophic with subungual debris, consistent with onychomycosis, 1-5 on right.   Remaining integument unremarkable. Pinpoint partial thickness wound to right 5th toe, no significant redness, warmth, swelling, minimal bloody drainage.   Vasculature:  Dorsalis Pedis pulse 1/4 right. Posterior Tibial pulse  0/4 right.  Capillary fill time <3 sec 1-5 on right. Diminished hair growth to the level of the digits.Temperature gradient within normal limits. No varicosities present on right. No edema present on right but trophic skin changes and multiple abrasions.  Neurology: The patient has absent sensation measured with a 5.07/10g Semmes Weinstein Monofilament at all pedal sites on right. Vibratory sensation absent on right with tuning fork.  Musculoskeletal: Left BKA.  Assessment and Plan: Problem List Items Addressed This Visit       Genitourinary   ESRD on dialysis ([place])  Other Visit Diagnoses     Toe ulcer, right, limited to breakdown of skin ([place])    -  Primary   Diabetic polyneuropathy associated with type 2 diabetes mellitus ([place])       Hx of BKA, left ([place])          -Examined patient. -Discussed and educated patient on diabetic foot care, especially with  regards to the vascular, neurological and musculoskeletal systems.  -Patient declined xrays at this time -Excisionally dedbrided ulceration at right 5th toe to healthy bleeding borders removing nonviable tissue using a sterile chisel blade. Wound measures post debridement as above. Wound was debrided to the level of the dermis with viable wound base exposed to promote healing. Hemostasis was achieved with manuel pressure. Patient tolerated procedure well without any discomfort or anesthesia necessary for this wound debridement.  -Applied betadine and bandaid dressing and instructed patient to continue with daily dressings at home consisting of  same - Advised patient to go to the ER or return to office if the wound worsens or if constitutional symptoms are present. -Refilled Ceforxine antibiotic -Return in 3 weeks for wound care -Patient advised to call the office if any problems or questions arise in the meantime.  Landis Martins, DPM

## 2021-03-19 ENCOUNTER — Ambulatory Visit: Payer: Medicare Other | Admitting: Sports Medicine

## 2021-03-21 ENCOUNTER — Telehealth: Payer: Self-pay | Admitting: Podiatry

## 2021-03-21 NOTE — Telephone Encounter (Signed)
Pt left message stating his pcp signed the documents for the diabetic shoes and he is wanting to know when he can pick them up.  I returned call and left message that once documents are received it takes about 3 weeks to get the inserts and shoes in.(They do not make inserts until we get documents) and we will call pt to schedule an appt to pick them up. I did ask pt to call if he has not heard from [place] in 3 wks or so.

## 2021-03-25 NOTE — Telephone Encounter (Signed)
Pt left message Friday afternoon returning my call.  I returned his call and was not able to leave a message but I did put in the notes for pt to pick up diabetic shoes on 9.27 at his appt with Dr Cannon Kettle

## 2021-04-15 ENCOUNTER — Other Ambulatory Visit: Payer: Self-pay

## 2021-04-15 ENCOUNTER — Encounter: Payer: Self-pay | Admitting: Sports Medicine

## 2021-04-15 ENCOUNTER — Ambulatory Visit (INDEPENDENT_AMBULATORY_CARE_PROVIDER_SITE_OTHER): Payer: Medicare Other | Admitting: Sports Medicine

## 2021-04-15 DIAGNOSIS — Z89512 Acquired absence of left leg below knee: Secondary | ICD-10-CM

## 2021-04-15 DIAGNOSIS — E1142 Type 2 diabetes mellitus with diabetic polyneuropathy: Secondary | ICD-10-CM

## 2021-04-15 DIAGNOSIS — S90811D Abrasion, right foot, subsequent encounter: Secondary | ICD-10-CM

## 2021-04-15 DIAGNOSIS — N186 End stage renal disease: Secondary | ICD-10-CM

## 2021-04-15 DIAGNOSIS — Z992 Dependence on renal dialysis: Secondary | ICD-10-CM

## 2021-04-15 NOTE — Progress Notes (Signed)
Subjective: Michael Riggs is a 50 y.o. male patient with history of diabetes who returns to office today for wound check, states that his right 5th toe is better and last week has some bleeding for the right 4th toe now its better.   FBS not recorded  A1c 5.5 same as before  Patient denies any other issues.  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   Personal history of COVID-19 09/23/2020   COVID-19 09/11/2020   Acute respiratory failure with hypoxia ([place]) 11/06/2019   CAP (community acquired pneumonia) 10/11/2019   Generalized hyperhidrosis 10/11/2019   Allergy, unspecified, initial encounter 10/03/2019   Diarrhea, unspecified 07/24/2019   Lower GI bleed 05/14/2019   Pressure injury of skin 04/27/2019   Admission for palliative care 04/27/2019   Major depressive disorder, single episode, unspecified 04/27/2019   Other disorders of calcium metabolism 04/27/2019   Palliative care encounter    Goals of care, counseling/discussion    Calciphylaxis    Wound infection 03/28/2019   Hypertension    Dyslipidemia    Diabetes mellitus with peripheral vascular disease ([place])    Depression    Physical deconditioning    Coagulation defect, unspecified ([place]) 03/15/2019   Other malaise 03/14/2019   Other specified disorders of the male genital organs 03/14/2019   Other specified symptoms and signs involving the circulatory and respiratory systems 03/14/2019   Unspecified protein-calorie malnutrition ([place]) 03/14/2019   Pruritus    Scrotal pain    Labile blood pressure    Scrotal edema    Status post below-knee amputation of left lower extremity (HCC)    Slow transit constipation    Leukocytosis    Sleep disturbance    Essential hypertension    Anemia of chronic disease    Controlled type 2 diabetes mellitus with hyperglycemia, without long-term current use of insulin ([place])    ESRD on dialysis ([place])    Urinary retention     Debility 02/09/2019   Fluid overload 02/04/2019   Dyspnea, unspecified 02/03/2019   Gastro-esophageal reflux disease without esophagitis 02/03/2019   Hyperlipidemia, unspecified 02/03/2019   Iron deficiency anemia, unspecified 02/03/2019   Major depressive disorder, recurrent, in partial remission ([place]) 02/03/2019   Other problems related to lifestyle 02/03/2019   Pain in right leg 02/03/2019   Pain, unspecified 02/03/2019   Psychophysiologic insomnia 02/03/2019   Secondary hyperparathyroidism of renal origin ([place]) 02/03/2019   Testicular hypofunction 02/03/2019   Hyperkalemia 01/30/2019   ARF (acute renal failure) ([place]) 01/30/2019   Hypokalemia 01/30/2019   Hypoglycemia 01/30/2019   Syphilis 01/30/2019   Macrocytic anemia 01/30/2019   ESRD (end stage renal disease) ([place])    ESRD (end stage renal disease) on dialysis ([place]) 01/2019   PDR (proliferative diabetic retinopathy) ([place]) 12/14/2013   Venous hypertension 09/22/2013   Acquired absence of left leg below knee ([place]) 07/20/2013   Diabetes mellitus type 2 in obese ([place]) 07/04/2013   Habitual alcohol use 07/04/2013   Obesity, Class II, BMI 35-39.9, with comorbidity 07/04/2013   Current Outpatient Medications on File Prior to Visit  Medication Sig Dispense Refill   bromocriptine (PARLODEL) 2.5 MG tablet Take by mouth.     diphenhydrAMINE (BENADRYL ALLERGY) 25 MG tablet Take by mouth.     ferric citrate (AURYXIA) 1 GM 210 MG(Fe) tablet Take by mouth.     Methoxy PEG-Epoetin Beta (MIRCERA IJ) Mircera     ondansetron (ZOFRAN) 4 MG tablet Take by mouth.  acetaminophen (TYLENOL) 325 MG tablet Take 2 tablets (650 mg total) by mouth every 6 (six) hours as needed for mild pain (or Fever >/= 101).     albuterol (VENTOLIN HFA) 108 (90 Base) MCG/ACT inhaler Inhale 2 puffs into the lungs every 6 (six) hours as needed for wheezing or shortness of breath.     amLODipine (NORVASC) 5 MG tablet Take 5 mg by mouth at bedtime.      atorvastatin  (LIPITOR) 40 MG tablet Take 1 tablet (40 mg total) by mouth daily. (Patient taking differently: Take 40 mg by mouth at bedtime.)     bromocriptine (PARLODEL) 2.5 MG tablet Take 1 tablet (2.5 mg total) by mouth at bedtime. 90 tablet 3   buPROPion (WELLBUTRIN XL) 300 MG 24 hr tablet Take 300 mg by mouth at bedtime.      clindamycin (CLEOCIN T) 1 % external solution Apply topically.     clotrimazole-betamethasone (LOTRISONE) cream Apply topically.     collagenase (SANTYL) ointment Apply 1 application topically daily. Apply to the affected area daily plus dry dressing 90 g 3   diclofenac Sodium (VOLTAREN) 1 % GEL Apply 2 g topically 4 (four) times daily. 100 g 0   DULoxetine (CYMBALTA) 20 MG capsule Take 20 mg by mouth 2 (two) times daily.     ferric citrate (AURYXIA) 1 GM 210 MG(Fe) tablet Take by mouth.     Hydrocortisone (GERHARDT'S BUTT CREAM) CREA Apply 1 application topically 3 (three) times daily as needed for irritation. 1 each 0   ipratropium-albuterol (DUONEB) 0.5-2.5 (3) MG/3ML SOLN Take 3 mLs by nebulization every 6 (six) hours as needed (For shortness of breath).     LOPERAMIDE HCL PO Take by mouth.     Melatonin 5 MG TABS Take 5 mg by mouth at bedtime as needed (sleep).     midodrine (PROAMATINE) 10 MG tablet Take by mouth.     oxyCODONE (OXY IR/ROXICODONE) 5 MG immediate release tablet Take 5 mg by mouth 2 (two) times daily as needed for moderate pain.      oxycodone (OXY-IR) 5 MG capsule Take 1 capsule (5 mg total) by mouth every 6 (six) hours as needed. 20 capsule 0   pantoprazole (PROTONIX) 40 MG tablet Take 1 tablet (40 mg total) by mouth daily. (Patient taking differently: Take 40 mg by mouth at bedtime.)     sucroferric oxyhydroxide (VELPHORO) 500 MG chewable tablet Chew 1,000 mg by mouth 3 (three) times daily with meals.     tamsulosin (FLOMAX) 0.4 MG CAPS capsule Take 1 capsule (0.4 mg total) by mouth daily after breakfast. (Patient taking differently: Take 0.4 mg by mouth at  bedtime.) 30 capsule    traMADol (ULTRAM) 50 MG tablet Take 1 tablet (50 mg total) by mouth every 8 (eight) hours as needed for moderate pain. 15 tablet 0   No current facility-administered medications on file prior to visit.   No Known Allergies  No results found for this or any previous visit (from the past 2160 hour(s)).  Objective: General: Patient is awake, alert, and oriented x 3 and in no acute distress.  Integument: Skin is warm and dry on right.  Nails are mildly elongated thickened and dystrophic with subungual debris, consistent with onychomycosis, 1-5 on right.   Remaining integument unremarkable. Healed abrasions to right foot, no significant redness, no warmth, no swelling, no bloody drainage.   Vasculature:  Dorsalis Pedis pulse 1/4 right. Posterior Tibial pulse  0/4 right.  Capillary fill time <  3 sec 1-5 on right. Diminished hair growth to the level of the digits.Temperature gradient within normal limits. No varicosities present on right. No edema present on right but trophic skin changes and multiple abrasions and scars to right lower extremity.  Neurology: The patient has absent sensation measured with a 5.07/10g Semmes Weinstein Monofilament at all pedal sites on right. Vibratory sensation absent on right with tuning fork.  Musculoskeletal: Left BKA.  Assessment and Plan: Problem List Items Addressed This Visit       Genitourinary   ESRD on dialysis [facility])   Other Visit Diagnoses     Abrasion of right foot, subsequent encounter    -  Primary   Diabetic polyneuropathy associated with type 2 diabetes mellitus ([place])       Relevant Medications   bromocriptine (PARLODEL) 2.5 MG tablet   DULoxetine (CYMBALTA) 20 MG capsule   Hx of BKA, left ([place])          -Examined patient. -All wounds healed -Dry abrasions to right lower extremity  - Advised patient to go to the ER or return to office if the wounds returns/worsens or if constitutional symptoms are  present. -Return for routine nail care with Dr. Elisha Ponder  -Patient advised to call the office if any problems or questions arise in the meantime.  Landis Martins, DPM

## 2021-04-22 ENCOUNTER — Ambulatory Visit (INDEPENDENT_AMBULATORY_CARE_PROVIDER_SITE_OTHER): Payer: Medicare Other | Admitting: Sports Medicine

## 2021-04-22 DIAGNOSIS — Z89432 Acquired absence of left foot: Secondary | ICD-10-CM

## 2021-04-22 DIAGNOSIS — E1142 Type 2 diabetes mellitus with diabetic polyneuropathy: Secondary | ICD-10-CM

## 2021-04-22 DIAGNOSIS — S90811D Abrasion, right foot, subsequent encounter: Secondary | ICD-10-CM

## 2021-04-22 DIAGNOSIS — Z89512 Acquired absence of left leg below knee: Secondary | ICD-10-CM

## 2021-04-22 DIAGNOSIS — L84 Corns and callosities: Secondary | ICD-10-CM

## 2021-04-22 DIAGNOSIS — L97511 Non-pressure chronic ulcer of other part of right foot limited to breakdown of skin: Secondary | ICD-10-CM

## 2021-04-22 DIAGNOSIS — Z992 Dependence on renal dialysis: Secondary | ICD-10-CM

## 2021-04-22 DIAGNOSIS — N186 End stage renal disease: Secondary | ICD-10-CM

## 2021-04-22 NOTE — Progress Notes (Signed)
Patient discussed with medical assistant.  Diabetic shoes were dispensed to patient. Patient to follow up as scheduled for continued care or sooner if problems or issues arise. -Dr. Cannon Kettle

## 2021-04-24 ENCOUNTER — Ambulatory Visit: Payer: Medicare Other

## 2021-05-15 ENCOUNTER — Ambulatory Visit: Payer: Medicare Other | Admitting: Podiatry

## 2021-05-22 ENCOUNTER — Ambulatory Visit (INDEPENDENT_AMBULATORY_CARE_PROVIDER_SITE_OTHER): Payer: Medicare Other | Admitting: Podiatry

## 2021-05-22 ENCOUNTER — Other Ambulatory Visit: Payer: Self-pay

## 2021-05-22 ENCOUNTER — Encounter: Payer: Self-pay | Admitting: Podiatry

## 2021-05-22 DIAGNOSIS — Z89512 Acquired absence of left leg below knee: Secondary | ICD-10-CM

## 2021-05-22 DIAGNOSIS — B351 Tinea unguium: Secondary | ICD-10-CM

## 2021-05-22 DIAGNOSIS — R234 Changes in skin texture: Secondary | ICD-10-CM

## 2021-05-22 DIAGNOSIS — E119 Type 2 diabetes mellitus without complications: Secondary | ICD-10-CM

## 2021-05-22 DIAGNOSIS — E1142 Type 2 diabetes mellitus with diabetic polyneuropathy: Secondary | ICD-10-CM | POA: Diagnosis not present

## 2021-05-22 DIAGNOSIS — I739 Peripheral vascular disease, unspecified: Secondary | ICD-10-CM | POA: Diagnosis not present

## 2021-05-22 MED ORDER — MUPIROCIN 2 % EX OINT: TOPICAL_OINTMENT | CUTANEOUS | 1 refills | Status: DC

## 2021-05-25 NOTE — Progress Notes (Signed)
ANNUAL DIABETIC FOOT EXAM  Subjective: Michael Riggs presents today for for annual diabetic foot examination and at risk foot care. 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  Patient relates more than 6 year h/o diabetes.  Patient has h/o BKA LLE.  Patient's blood sugar was 140 mg/dl this morning.   [name], MD is patient's PCP. Last visit was October, 2022.  He has concern of cracked areas of skin noted on the posterior aspect of his Achilles tendon for the past several weeks. He denies any redness, drainage or swelling. No fever, chills, nightsweats, nausea or vomiting.  Past Medical History:  Diagnosis Date   Anemia    Anemia of chronic disease    Anxiety    ARF (acute renal failure) ([place]) 01/30/2019   Calciphylaxis    Controlled type 2 diabetes mellitus with hyperglycemia, without long-term current use of insulin ([place])    Debility 02/09/2019   Depression    Diabetes mellitus type 2 in obese ([place]) 07/04/2013   Diabetes mellitus with peripheral vascular disease ([place])    type 2 no meds, lost 100 lbs   Dyslipidemia    Dyspnea    inhaler   End stage renal disease ([place])    ESRD (end stage renal disease) on dialysis ([place]) 01/2019   MWFS   ESRD on dialysis [facility])    Essential hypertension    Fluid overload 02/04/2019   GERD (gastroesophageal reflux disease)    Goals of care, counseling/discussion    Habitual alcohol use 07/04/2013   Headache    Hyperkalemia 01/30/2019   Hypertension    Hypoglycemia 01/30/2019   Hypokalemia 01/30/2019   Labile blood pressure    Leukocytosis    Lower GI bleed 05/14/2019   Macrocytic anemia 01/30/2019   Obesity, Class II, BMI 35-39.9, with comorbidity 07/04/2013   Palliative care encounter    PDR (proliferative diabetic retinopathy) ([place]) 12/14/2013   Physical deconditioning    Pressure injury of skin 04/27/2019   Pruritus    Scrotal edema    Scrotal pain    Sleep apnea    uses CPAP   Sleep  disturbance    Slow transit constipation    Status post below-knee amputation of left lower extremity ([place])    Stroke ([place])    mini -shown on CT scan   Syphilis 01/2019   Urinary retention    Venous hypertension 09/22/2013   Wound infection 03/28/2019   Patient Active Problem List   Diagnosis Date Noted   Hyperprolactinemia ([place]) 12/05/2020   Personal history of COVID-19 09/23/2020   COVID-19 09/11/2020   Acute respiratory failure with hypoxia ([place]) 11/06/2019   CAP (community acquired pneumonia) 10/11/2019   Generalized hyperhidrosis 10/11/2019   Allergy, unspecified, initial encounter 10/03/2019   Diarrhea, unspecified 07/24/2019   Lower GI bleed 05/14/2019   Pressure injury of skin 04/27/2019   Admission for palliative care 04/27/2019   Major depressive disorder, single episode, unspecified 04/27/2019   Other disorders of calcium metabolism 04/27/2019   Palliative care encounter    Goals of care, counseling/discussion    Calciphylaxis    Wound infection 03/28/2019   Hypertension    Dyslipidemia    Diabetes mellitus with peripheral vascular disease ([place])    Depression    Physical deconditioning    Coagulation defect, unspecified ([place]) 03/15/2019   Other malaise 03/14/2019   Other specified disorders of the male genital organs 03/14/2019   Other specified symptoms and signs involving the circulatory and  respiratory systems 03/14/2019   Unspecified protein-calorie malnutrition ([place]) 03/14/2019   Pruritus    Scrotal pain    Labile blood pressure    Scrotal edema    Status post below-knee amputation of left lower extremity (HCC)    Slow transit constipation    Leukocytosis    Sleep disturbance    Essential hypertension    Anemia of chronic disease    Controlled type 2 diabetes mellitus with hyperglycemia, without long-term current use of insulin ([place])    ESRD on dialysis [facility])    Urinary retention    Debility 02/09/2019   Fluid overload 02/04/2019   Dyspnea,  unspecified 02/03/2019   Gastro-esophageal reflux disease without esophagitis 02/03/2019   Hyperlipidemia, unspecified 02/03/2019   Iron deficiency anemia, unspecified 02/03/2019   Major depressive disorder, recurrent, in partial remission ([place]) 02/03/2019   Other problems related to lifestyle 02/03/2019   Pain in right leg 02/03/2019   Pain, unspecified 02/03/2019   Psychophysiologic insomnia 02/03/2019   Secondary hyperparathyroidism of renal origin ([place]) 02/03/2019   Testicular hypofunction 02/03/2019   Hyperkalemia 01/30/2019   ARF (acute renal failure) ([place]) 01/30/2019   Hypokalemia 01/30/2019   Hypoglycemia 01/30/2019   Syphilis 01/30/2019   Macrocytic anemia 01/30/2019   ESRD (end stage renal disease) ([place])    ESRD (end stage renal disease) on dialysis ([place]) 01/2019   PDR (proliferative diabetic retinopathy) ([place]) 12/14/2013   Venous hypertension 09/22/2013   Acquired absence of left leg below knee ([place]) 07/20/2013   Diabetes mellitus type 2 in obese ([place]) 07/04/2013   Habitual alcohol use 07/04/2013   Obesity, Class II, BMI 35-39.9, with comorbidity 07/04/2013   Past Surgical History:  Procedure Laterality Date   AV FISTULA PLACEMENT Left 01/09/2019   Procedure: ARTERIOVENOUS (AV) FISTULA CREATION LEFT UPPER ARM;  Surgeon: [name], MD;  [facility];  Service: Vascular;  Laterality: Left;   [place] Left 07/06/2019   Procedure: BASILIC VEIN TRANSPOSITION SECOND STAGE LEFT ARM;  Surgeon: [name], MD;  [facility];  Service: Vascular;  Laterality: Left;   below the knee amputation Left    EYE SURGERY Bilateral    FLEXIBLE SIGMOIDOSCOPY N/A 05/15/2019   Procedure: FLEXIBLE SIGMOIDOSCOPY;  Surgeon: Irving Copas., MD;  Location: [facility] ENDOSCOPY;  Service: Gastroenterology;  Laterality: N/A;   HEMOSTASIS CLIP PLACEMENT  05/15/2019   Procedure: HEMOSTASIS CLIP PLACEMENT;  Surgeon: Irving Copas., MD;  Location: [place];   Service: Gastroenterology;;   HOT HEMOSTASIS N/A 05/15/2019   Procedure: HOT HEMOSTASIS (ARGON PLASMA COAGULATION/BICAP);  Surgeon: Irving Copas., MD;  Location: [place];  Service: Gastroenterology;  Laterality: N/A;   IR FLUORO GUIDE CV LINE RIGHT  01/31/2019   IR FLUORO GUIDE CV LINE RIGHT  02/03/2019   IR THORACENTESIS ASP PLEURAL SPACE W/IMG GUIDE  11/08/2019   IR US GUIDE VASC ACCESS RIGHT  01/31/2019   IR US GUIDE VASC ACCESS RIGHT  02/03/2019   SCLEROTHERAPY  05/15/2019   Procedure: SCLEROTHERAPY;  Surgeon: Mansouraty, Telford Nab., MD;  Location: [place];  Service: Gastroenterology;;   Current Outpatient Medications on File Prior to Visit  Medication Sig Dispense Refill   acetaminophen (TYLENOL) 325 MG tablet Take 2 tablets (650 mg total) by mouth every 6 (six) hours as needed for mild pain (or Fever >/= 101).     albuterol (VENTOLIN HFA) 108 (90 Base) MCG/ACT inhaler Inhale 2 puffs into the lungs every 6 (six) hours as needed for wheezing or shortness of  breath.     amLODipine (NORVASC) 5 MG tablet Take 5 mg by mouth at bedtime.      atorvastatin (LIPITOR) 40 MG tablet Take 1 tablet (40 mg total) by mouth daily. (Patient taking differently: Take 40 mg by mouth at bedtime.)     bromocriptine (PARLODEL) 2.5 MG tablet Take 1 tablet (2.5 mg total) by mouth at bedtime. 90 tablet 3   bromocriptine (PARLODEL) 2.5 MG tablet Take by mouth.     buPROPion (WELLBUTRIN XL) 300 MG 24 hr tablet Take 300 mg by mouth at bedtime.      clindamycin (CLEOCIN T) 1 % external solution Apply topically.     clotrimazole-betamethasone (LOTRISONE) cream Apply topically.     collagenase (SANTYL) ointment Apply 1 application topically daily. Apply to the affected area daily plus dry dressing 90 g 3   diclofenac Sodium (VOLTAREN) 1 % GEL Apply 2 g topically 4 (four) times daily. 100 g 0   diphenhydrAMINE (BENADRYL ALLERGY) 25 MG tablet Take by mouth.     DULoxetine (CYMBALTA) 20 MG capsule Take 20  mg by mouth 2 (two) times daily.     ferric citrate (AURYXIA) 1 GM 210 MG(Fe) tablet Take by mouth.     ferric citrate (AURYXIA) 1 GM 210 MG(Fe) tablet Take by mouth.     Hydrocortisone (GERHARDT'S BUTT CREAM) CREA Apply 1 application topically 3 (three) times daily as needed for irritation. 1 each 0   ipratropium-albuterol (DUONEB) 0.5-2.5 (3) MG/3ML SOLN Take 3 mLs by nebulization every 6 (six) hours as needed (For shortness of breath).     LOPERAMIDE HCL PO Take by mouth.     Melatonin 5 MG TABS Take 5 mg by mouth at bedtime as needed (sleep).     Methoxy PEG-Epoetin Beta (MIRCERA IJ) Mircera     midodrine (PROAMATINE) 10 MG tablet Take by mouth.     ondansetron (ZOFRAN) 4 MG tablet Take by mouth.     oxyCODONE (OXY IR/ROXICODONE) 5 MG immediate release tablet Take 5 mg by mouth 2 (two) times daily as needed for moderate pain.      oxycodone (OXY-IR) 5 MG capsule Take 1 capsule (5 mg total) by mouth every 6 (six) hours as needed. 20 capsule 0   pantoprazole (PROTONIX) 40 MG tablet Take 1 tablet (40 mg total) by mouth daily. (Patient taking differently: Take 40 mg by mouth at bedtime.)     sucroferric oxyhydroxide (VELPHORO) 500 MG chewable tablet Chew 1,000 mg by mouth 3 (three) times daily with meals.     tamsulosin (FLOMAX) 0.4 MG CAPS capsule Take 1 capsule (0.4 mg total) by mouth daily after breakfast. (Patient taking differently: Take 0.4 mg by mouth at bedtime.) 30 capsule    traMADol (ULTRAM) 50 MG tablet Take 1 tablet (50 mg total) by mouth every 8 (eight) hours as needed for moderate pain. 15 tablet 0   No current facility-administered medications on file prior to visit.    No Known Allergies Social History   Occupational History   Not on file  Tobacco Use   Smoking status: Former    Types: Cigarettes    Start date: 11/18/2018    Quit date: 01/18/2019    Years since quitting: 2.3   Smokeless tobacco: Never   Tobacco comments:    pt stated got bored with it  Vaping Use    Vaping Use: Never used  Substance and Sexual Activity   Alcohol use: Not Currently    Comment: heavy drinker in  the past, none since 07/19/18   Drug use: Not Currently   Sexual activity: Not Currently   Family History  Problem Relation Age of Onset   Diabetes Mother    Multiple myeloma Mother    Immunization History  Administered Date(s) Administered   Influenza,inj,Quad PF,6+ Mos 04/12/2019     Review of Systems: Negative except as noted in the HPI.   Objective: There were no vitals filed for this visit.  Michael Riggs is a pleasant 50 y.o. male in NAD. AAO X 3.  Vascular Examination: Capillary fill time to digits <3 seconds right lower extremity. Faintly palpable DP pulse(s) RLE. Nonpalpable PT pulse(s) right lower extremity. No pain with calf compression RLE. Lower extremity skin temperature gradient within normal limits. Scarring noted from previous episodes of calciphylaxis RLE. No edema noted right lower extremity.  Dermatological Examination: No open wounds b/l LE. No interdigital macerations right lower extremity. Toenails 1-5 right elongated, discolored, dystrophic, thickened, and crumbly with subungual debris and tenderness to dorsal palpation.  He has tranverse fissuring of skin noted along posterior aspect of Achilles right lower extremity. Fissures are superficial. No erythema, no edema, no drainage, no fluctuance.  Musculoskeletal Examination: Muscle strength 5/5 to all LE muscle groups of right lower extremity. Wearing BK prosthesis RLE.  Footwear Assessment: Does the patient wear appropriate shoes? Yes. Does the patient need inserts/orthotics? Yes.  Neurological Examination: Protective sensation diminished with 10 gram monofilament right lower extremity. Vibratory sensation diminished right lower extremity.  Assessment: 1. Onychomycosis   2. Fissure of skin   3. Hx of BKA, left ([place])   4. PAD (peripheral artery disease) ([place])   5. Diabetic  polyneuropathy associated with type 2 diabetes mellitus ([place])   6. Encounter for diabetic foot exam ([place])     ADA Risk Categorization:  High Risk  Patient has one or more of the following: Loss of protective sensation Absent pedal pulses Severe Foot deformity History of foot ulcer  Plan: -Examined patient. -Diabetic foot examination performed today. -Continue diabetic foot care principles: inspect feet daily, monitor glucose as recommended by PCP and/or Endocrinologist, and follow prescribed diet per PCP, Endocrinologist and/or dietician. Meds ordered this encounter  Medications   mupirocin ointment (BACTROBAN) 2 %    Sig: Apply to right lower extremity once daily.    Dispense:  30 g    Refill:  1  -Toenails 1-5 right debrided in length and girth without iatrogenic bleeding with sterile nail nipper and dremel.  -Patient/POA to call should there be question/concern in the interim. -Return in about 2 weeks (around 06/05/2021) with Dr. Cannon Kettle for follow up of fissuring Achilles RLE.  Marzetta Board, DPM

## 2021-05-26 ENCOUNTER — Encounter: Payer: Self-pay | Admitting: Podiatry

## 2021-09-02 ENCOUNTER — Encounter: Payer: Self-pay | Admitting: Endocrinology

## 2021-09-02 ENCOUNTER — Ambulatory Visit (INDEPENDENT_AMBULATORY_CARE_PROVIDER_SITE_OTHER): Payer: Medicare Other | Admitting: Endocrinology

## 2021-09-02 ENCOUNTER — Other Ambulatory Visit: Payer: Self-pay

## 2021-09-02 VITALS — BP 124/90 | HR 70 | Ht 70.0 in

## 2021-09-02 DIAGNOSIS — E221 Hyperprolactinemia: Secondary | ICD-10-CM

## 2021-09-02 MED ORDER — SILDENAFIL CITRATE 100 MG PO TABS: 100.0000 mg | ORAL_TABLET | Freq: Every day | ORAL | 11 refills | Status: DC | PRN

## 2021-09-02 NOTE — Progress Notes (Signed)
Subjective:    [name], male    DOB: 10-Aug-1970, 51 y.o.   MRN: [id number]  HPI Pt returns for f/u of hyperprolactinemia in 2022; he has never had pituitary imaging; he is not interested in fertility now; he has h/o alcoholism, but none since 2021; no other cause was found; he was rx'ed parlodel).  No new sxs.  Pt says he stopped parlodel, due to no improvement in libido and ED sxs.   Past Medical History:  Diagnosis Date   Anemia    Anemia of chronic disease    Anxiety    ARF (acute renal failure) ([place]) 01/30/2019   Calciphylaxis    Controlled type 2 diabetes mellitus with hyperglycemia, without long-term current use of insulin ([place])    Debility 02/09/2019   Depression    Diabetes mellitus type 2 in obese ([place]) 07/04/2013   Diabetes mellitus with peripheral vascular disease ([place])    type 2 no meds, lost 100 lbs   Dyslipidemia    Dyspnea    inhaler   End stage renal disease ([place])    ESRD (end stage renal disease) on dialysis ([place]) 01/2019   MWFS   ESRD on dialysis [facility])    Essential hypertension    Fluid overload 02/04/2019   GERD (gastroesophageal reflux disease)    Goals of care, counseling/discussion    Habitual alcohol use 07/04/2013   Headache    Hyperkalemia 01/30/2019   Hypertension    Hypoglycemia 01/30/2019   Hypokalemia 01/30/2019   Labile blood pressure    Leukocytosis    Lower GI bleed 05/14/2019   Macrocytic anemia 01/30/2019   Obesity, Class II, BMI 35-39.9, with comorbidity 07/04/2013   Palliative care encounter    PDR (proliferative diabetic retinopathy) ([place]) 12/14/2013   Physical deconditioning    Pressure injury of skin 04/27/2019   Pruritus    Scrotal edema    Scrotal pain    Sleep apnea    uses CPAP   Sleep disturbance    Slow transit constipation    Status post below-knee amputation of left lower extremity ([place])    Stroke ([place])    mini -shown on CT scan   Syphilis 01/2019   Urinary retention    Venous hypertension  09/22/2013   Wound infection 03/28/2019    Past Surgical History:  Procedure Laterality Date   AV FISTULA PLACEMENT Left 01/09/2019   Procedure: ARTERIOVENOUS (AV) FISTULA CREATION LEFT UPPER ARM;  Surgeon: [name], MD;  [facility];  Service: Vascular;  Laterality: Left;   [place] Left 07/06/2019   Procedure: BASILIC VEIN TRANSPOSITION SECOND STAGE LEFT ARM;  Surgeon: [name], MD;  [facility];  Service: Vascular;  Laterality: Left;   below the knee amputation Left    EYE SURGERY Bilateral    FLEXIBLE SIGMOIDOSCOPY N/A 05/15/2019   Procedure: FLEXIBLE SIGMOIDOSCOPY;  Surgeon: Irving Copas., MD;  Location[facility] ENDOSCOPY;  Service: Gastroenterology;  Laterality: N/A;   HEMOSTASIS CLIP PLACEMENT  05/15/2019   Procedure: HEMOSTASIS CLIP PLACEMENT;  Surgeon: Irving Copas., MD;  Location: [place];  Service: Gastroenterology;;   HOT HEMOSTASIS N/A 05/15/2019   Procedure: HOT HEMOSTASIS (ARGON PLASMA COAGULATION/BICAP);  Surgeon: Irving Copas., MD;  Location: [place];  Service: Gastroenterology;  Laterality: N/A;   IR FLUORO GUIDE CV LINE RIGHT  01/31/2019   IR FLUORO GUIDE CV LINE RIGHT  02/03/2019   IR THORACENTESIS ASP PLEURAL SPACE W/IMG GUIDE  11/08/2019  IR US GUIDE VASC ACCESS RIGHT  01/31/2019   IR US GUIDE VASC ACCESS RIGHT  02/03/2019   SCLEROTHERAPY  05/15/2019   Procedure: Clide Deutscher;  Surgeon: Mansouraty, Telford Nab., MD;  Location: [facility] ENDOSCOPY;  Service: Gastroenterology;;    Social History   Socioeconomic History   Marital status: Divorced    Spouse name: Not on file   Number of children: Not on file   Years of education: Not on file   Highest education level: Not on file  Occupational History   Not on file  Tobacco Use   Smoking status: Former    Types: Cigarettes    Start date: 11/18/2018    Quit date: 01/18/2019    Years since quitting: 2.6   Smokeless tobacco: Never   Tobacco comments:    pt  stated got bored with it  Vaping Use   Vaping Use: Never used  Substance and Sexual Activity   Alcohol use: Not Currently    Comment: heavy drinker in the past, none since 07/19/18   Drug use: Not Currently   Sexual activity: Not Currently  Other Topics Concern   Not on file  Social History Narrative   Not on file   Social Determinants of Health   Financial Resource Strain: Not on file  Food Insecurity: Not on file  Transportation Needs: Not on file  Physical Activity: Not on file  Stress: Not on file  Social Connections: Not on file  Intimate Partner Violence: Not on file    Current Outpatient Medications on File Prior to Visit  Medication Sig Dispense Refill   acetaminophen (TYLENOL) 325 MG tablet Take 2 tablets (650 mg total) by mouth every 6 (six) hours as needed for mild pain (or Fever >/= 101).     albuterol (VENTOLIN HFA) 108 (90 Base) MCG/ACT inhaler Inhale 2 puffs into the lungs every 6 (six) hours as needed for wheezing or shortness of breath.     amLODipine (NORVASC) 5 MG tablet Take 5 mg by mouth at bedtime.      atorvastatin (LIPITOR) 40 MG tablet Take 1 tablet (40 mg total) by mouth daily. (Patient taking differently: Take 40 mg by mouth at bedtime.)     bromocriptine (PARLODEL) 2.5 MG tablet Take 1 tablet (2.5 mg total) by mouth at bedtime. 90 tablet 3   bromocriptine (PARLODEL) 2.5 MG tablet Take by mouth.     buPROPion (WELLBUTRIN XL) 300 MG 24 hr tablet Take 300 mg by mouth at bedtime.      clindamycin (CLEOCIN T) 1 % external solution Apply topically.     clotrimazole-betamethasone (LOTRISONE) cream Apply topically.     collagenase (SANTYL) ointment Apply 1 application topically daily. Apply to the affected area daily plus dry dressing 90 g 3   diclofenac Sodium (VOLTAREN) 1 % GEL Apply 2 g topically 4 (four) times daily. 100 g 0   diphenhydrAMINE (BENADRYL) 25 MG tablet Take by mouth.     DULoxetine (CYMBALTA) 20 MG capsule Take 20 mg by mouth 2 (two) times  daily.     ferric citrate (AURYXIA) 1 GM 210 MG(Fe) tablet Take by mouth.     ferric citrate (AURYXIA) 1 GM 210 MG(Fe) tablet Take by mouth.     Hydrocortisone (GERHARDT'S BUTT CREAM) CREA Apply 1 application topically 3 (three) times daily as needed for irritation. 1 each 0   ipratropium-albuterol (DUONEB) 0.5-2.5 (3) MG/3ML SOLN Take 3 mLs by nebulization every 6 (six) hours as needed (For shortness of breath).  Melatonin 5 MG TABS Take 5 mg by mouth at bedtime as needed (sleep).     Methoxy PEG-Epoetin Beta (MIRCERA IJ) Mircera     midodrine (PROAMATINE) 10 MG tablet Take by mouth.     mupirocin ointment (BACTROBAN) 2 % Apply to right lower extremity once daily. 30 g 1   ondansetron (ZOFRAN) 4 MG tablet Take by mouth.     oxyCODONE (OXY IR/ROXICODONE) 5 MG immediate release tablet Take 5 mg by mouth 2 (two) times daily as needed for moderate pain.      oxycodone (OXY-IR) 5 MG capsule Take 1 capsule (5 mg total) by mouth every 6 (six) hours as needed. 20 capsule 0   pantoprazole (PROTONIX) 40 MG tablet Take 1 tablet (40 mg total) by mouth daily. (Patient taking differently: Take 40 mg by mouth at bedtime.)     sucroferric oxyhydroxide (VELPHORO) 500 MG chewable tablet Chew 1,000 mg by mouth 3 (three) times daily with meals.     tamsulosin (FLOMAX) 0.4 MG CAPS capsule Take 1 capsule (0.4 mg total) by mouth daily after breakfast. (Patient taking differently: Take 0.4 mg by mouth at bedtime.) 30 capsule    traMADol (ULTRAM) 50 MG tablet Take 1 tablet (50 mg total) by mouth every 8 (eight) hours as needed for moderate pain. 15 tablet 0   No current facility-administered medications on file prior to visit.    No Known Allergies  Family History  Problem Relation Age of Onset   Diabetes Mother    Multiple myeloma Mother     BP 124/90 (BP Location: Right Arm, Patient Position: Sitting, Cuff Size: Normal)    Pulse 70    Ht '5\' 10"'  (1.778 m)    SpO2 96%    BMI 32.26 kg/m    Review of  Systems He has chronically decreased urinary stream.      Objective:   Physical Exam   Lab Results  Component Value Date   TSH 3.12 12/05/2020       Assessment & Plan:  Hyperprolactinemia: we discussed.  He declines further rx ED: uncontrolled  Patient Instructions  We'll hold off on the prescription for the prolactin for now.   I have sent a prescription to your pharmacy, for the Viagra.   I would be happy to see you back here as needed.

## 2021-09-02 NOTE — Patient Instructions (Addendum)
We'll hold off on the prescription for the prolactin for now.   I have sent a prescription to your pharmacy, for the Viagra.   I would be happy to see you back here as needed.

## 2021-09-29 ENCOUNTER — Emergency Department (HOSPITAL_COMMUNITY)
Admission: EM | Admit: 2021-09-29 | Discharge: 2021-09-30 | Disposition: A | Discharge status: Home / Self Care | Payer: Medicare Other | Attending: Emergency Medicine | Admitting: Emergency Medicine

## 2021-09-29 DIAGNOSIS — M79605 Pain in left leg: Secondary | ICD-10-CM | POA: Insufficient documentation

## 2021-09-29 DIAGNOSIS — M79662 Pain in left lower leg: Secondary | ICD-10-CM

## 2021-09-29 LAB — CBC WITH DIFFERENTIAL/PLATELET
Abs Immature Granulocytes: 0.06 10*3/uL (ref 0.00–0.07)
Basophils Absolute: 0 10*3/uL (ref 0.0–0.1)
Basophils Relative: 1 %
Eosinophils Absolute: 0.1 10*3/uL (ref 0.0–0.5)
Eosinophils Relative: 2 %
HCT: 42.1 % (ref 39.0–52.0)
Hemoglobin: 13 g/dL (ref 13.0–17.0)
Immature Granulocytes: 1 %
Lymphocytes Relative: 15 %
Lymphs Abs: 1.2 10*3/uL (ref 0.7–4.0)
MCH: 32.1 pg (ref 26.0–34.0)
MCHC: 30.9 g/dL (ref 30.0–36.0)
MCV: 104 fL — ABNORMAL HIGH (ref 80.0–100.0)
Monocytes Absolute: 0.7 10*3/uL (ref 0.1–1.0)
Monocytes Relative: 10 %
Neutro Abs: 5.5 10*3/uL (ref 1.7–7.7)
Neutrophils Relative %: 71 %
Platelets: 180 10*3/uL (ref 150–400)
RBC: 4.05 MIL/uL — ABNORMAL LOW (ref 4.22–5.81)
RDW: 15.7 % — ABNORMAL HIGH (ref 11.5–15.5)
WBC: 7.6 10*3/uL (ref 4.0–10.5)
nRBC: 0 % (ref 0.0–0.2)

## 2021-09-29 LAB — COMPREHENSIVE METABOLIC PANEL
ALT: 18 U/L (ref 0–44)
AST: 17 U/L (ref 15–41)
Albumin: 3.6 g/dL (ref 3.5–5.0)
Alkaline Phosphatase: 110 U/L (ref 38–126)
Anion gap: 12 (ref 5–15)
BUN: 53 mg/dL — ABNORMAL HIGH (ref 6–20)
CO2: 30 mmol/L (ref 22–32)
Calcium: 9.1 mg/dL (ref 8.9–10.3)
Chloride: 91 mmol/L — ABNORMAL LOW (ref 98–111)
Creatinine, Ser: 8.66 mg/dL — ABNORMAL HIGH (ref 0.61–1.24)
GFR, Estimated: 7 mL/min — ABNORMAL LOW (ref >60)
Glucose, Bld: 113 mg/dL — ABNORMAL HIGH (ref 70–99)
Potassium: 4.3 mmol/L (ref 3.5–5.1)
Sodium: 133 mmol/L — ABNORMAL LOW (ref 135–145)
Total Bilirubin: 0.5 mg/dL (ref 0.3–1.2)
Total Protein: 7.6 g/dL (ref 6.5–8.1)

## 2021-09-29 LAB — I-STAT CHEM 8, ED
BUN: 54 mg/dL — ABNORMAL HIGH (ref 6–20)
Calcium, Ion: 1.09 mmol/L — ABNORMAL LOW (ref 1.15–1.40)
Chloride: 90 mmol/L — ABNORMAL LOW (ref 98–111)
Creatinine, Ser: 8.8 mg/dL — ABNORMAL HIGH (ref 0.61–1.24)
Glucose, Bld: 105 mg/dL — ABNORMAL HIGH (ref 70–99)
HCT: 46 % (ref 39.0–52.0)
Hemoglobin: 15.6 g/dL (ref 13.0–17.0)
Potassium: 4.3 mmol/L (ref 3.5–5.1)
Sodium: 135 mmol/L (ref 135–145)
TCO2: 33 mmol/L — ABNORMAL HIGH (ref 22–32)

## 2021-09-29 MED ORDER — HYDROMORPHONE HCL 1 MG/ML IJ SOLN
1.0000 mg | Freq: Once | INTRAMUSCULAR | Status: AC
  Administered 2021-09-29: 1 mg via INTRAMUSCULAR
  Filled 2021-09-29: qty 1

## 2021-09-29 NOTE — ED Provider Notes (Incomplete)
[facility] EMERGENCY DEPARTMENT Provider Note   CSN: [id number] Arrival date & time: 09/29/21  1718     History {Add pertinent medical, surgical, social history, OB history to HPI:1} Chief Complaint  Patient presents with   Leg Pain    Michael Riggs is a 51 y.o. male.   Leg Pain     Home Medications Prior to Admission medications   Medication Sig Start Date End Date Taking? Authorizing Provider  acetaminophen (TYLENOL) 325 MG tablet Take 2 tablets (650 mg total) by mouth every 6 (six) hours as needed for mild pain (or Fever >/= 101). 03/13/19   Angiulli, Lavon Paganini, PA-C  albuterol (VENTOLIN HFA) 108 (90 Base) MCG/ACT inhaler Inhale 2 puffs into the lungs every 6 (six) hours as needed for wheezing or shortness of breath.    [provider]  amLODipine (NORVASC) 5 MG tablet Take 5 mg by mouth at bedtime.     [provider]  atorvastatin (LIPITOR) 40 MG tablet Take 1 tablet (40 mg total) by mouth daily. Patient taking differently: Take 40 mg by mouth at bedtime. 03/13/19   Angiulli, Lavon Paganini, PA-C  bromocriptine (PARLODEL) 2.5 MG tablet Take 1 tablet (2.5 mg total) by mouth at bedtime. 12/07/20   Renato Shin, MD  bromocriptine (PARLODEL) 2.5 MG tablet Take by mouth. 01/15/21   [provider]  buPROPion (WELLBUTRIN XL) 300 MG 24 hr tablet Take 300 mg by mouth at bedtime.  09/27/19   [provider]  clindamycin (CLEOCIN T) 1 % external solution Apply topically. 03/20/21   [provider]  clotrimazole-betamethasone (LOTRISONE) cream Apply topically. 09/05/20   [provider]  collagenase (SANTYL) ointment Apply 1 application topically daily. Apply to the affected area daily plus dry dressing 07/18/19   Dondra Prader R, NP  diclofenac Sodium (VOLTAREN) 1 % GEL Apply 2 g topically 4 (four) times daily. 10/12/19   Geradine Girt, DO  diphenhydrAMINE (BENADRYL) 25 MG tablet Take by mouth. 02/19/21   [provider]  DULoxetine (CYMBALTA) 20 MG capsule Take 20 mg by mouth 2 (two) times daily. 03/18/21   [provider]  ferric citrate (AURYXIA) 1 GM 210 MG(Fe) tablet Take by mouth. 07/26/20   [provider]  ferric citrate (AURYXIA) 1 GM 210 MG(Fe) tablet Take by mouth. 03/12/21   [provider]  Hydrocortisone (GERHARDT'S BUTT CREAM) CREA Apply 1 application topically 3 (three) times daily as needed for irritation. 04/27/19   Dessa Phi, DO  ipratropium-albuterol (DUONEB) 0.5-2.5 (3) MG/3ML SOLN Take 3 mLs by nebulization every 6 (six) hours as needed (For shortness of breath).    [provider]  Melatonin 5 MG TABS Take 5 mg by mouth at bedtime as needed (sleep).    [provider]  Methoxy PEG-Epoetin Beta (MIRCERA IJ) Mircera 01/22/21 01/21/22  [provider]  midodrine (PROAMATINE) 10 MG tablet Take by mouth. 09/17/20   [provider]  mupirocin ointment (BACTROBAN) 2 % Apply to right lower extremity once daily. 05/22/21   Marzetta Board, DPM  ondansetron (ZOFRAN) 4 MG tablet Take by mouth. 01/01/21   [provider]  oxyCODONE (OXY IR/ROXICODONE) 5 MG immediate release tablet Take 5 mg by mouth 2 (two) times daily as needed for moderate pain.  10/19/19   [provider]  oxycodone (OXY-IR) 5 MG capsule Take 1 capsule (5 mg total) by mouth every 6 (six) hours as needed. 07/06/19   Dagoberto Ligas, PA-C  pantoprazole (PROTONIX) 40 MG tablet Take 1 tablet (40 mg total) by mouth daily. Patient taking differently: Take 40 mg by mouth at bedtime. 03/13/19   Angiulli, Lavon Paganini, PA-C  sildenafil (VIAGRA) 100 MG tablet Take 1 tablet (100 mg total) by mouth daily as needed for erectile dysfunction. 09/02/21   Renato Shin, MD  sucroferric oxyhydroxide (VELPHORO) 500 MG chewable tablet Chew 1,000 mg by mouth 3 (three) times daily with meals.    [provider]  tamsulosin (FLOMAX) 0.4 MG CAPS capsule Take 1  capsule (0.4 mg total) by mouth daily after breakfast. Patient taking differently: Take 0.4 mg by mouth at bedtime. 03/13/19   Angiulli, Lavon Paganini, PA-C  traMADol (ULTRAM) 50 MG tablet Take 1 tablet (50 mg total) by mouth every 8 (eight) hours as needed for moderate pain. 05/18/19   Mercy Riding, MD      Allergies    Patient has no known allergies.    Review of Systems   Review of Systems  Physical Exam Updated Vital Signs BP (!) 130/107 (BP Location: Right Arm)    Pulse 85    Temp 97.6 F (36.4 C) (Oral)    Resp 13    SpO2 96%  Physical Exam  ED Results / Procedures / Treatments   Labs (all labs ordered are listed, but only abnormal results are displayed) Labs Reviewed  CBC WITH DIFFERENTIAL/PLATELET - Abnormal; Notable for the following components:      Result Value   RBC 4.05 (*)    MCV 104.0 (*)    RDW 15.7 (*)    All other components within normal limits  COMPREHENSIVE METABOLIC PANEL - Abnormal; Notable for the following components:   Sodium 133 (*)    Chloride 91 (*)    Glucose, Bld 113 (*)    BUN 53 (*)    Creatinine, Ser 8.66 (*)    GFR, Estimated 7 (*)    All other components within normal limits  I-STAT CHEM 8, ED - Abnormal; Notable for the following components:   Chloride 90 (*)    BUN 54 (*)    Creatinine, Ser 8.80 (*)    Glucose, Bld 105 (*)    Calcium, Ion 1.09 (*)    TCO2 33 (*)    All other components within normal limits    EKG EKG Interpretation  Date/Time:  Monday September 29 2021 18:26:55 EDT Ventricular Rate:  84 PR Interval:  174 QRS Duration: 90 QT Interval:  388 QTC Calculation: 458 R Axis:   102 Text Interpretation: Normal sinus rhythm Rightward axis ST & T wave abnormality, consider anterior ischemia Abnormal ECG When compared with ECG of 27-Nov-2020 19:40, PREVIOUS ECG IS PRESENT Confirmed by Dene Gentry [phone]) on 09/29/2021 8:58:52 PM  Radiology No results found.  Procedures Procedures  {Document cardiac monitor, telemetry  assessment procedure when appropriate:1}  Medications Ordered in ED Medications  HYDROmorphone (DILAUDID) injection 1 mg (1 mg Intramuscular Given 09/29/21 2127)    ED Course/ Medical Decision Making/ A&P                           Medical Decision Making Amount and/or Complexity of Data Reviewed Labs: ordered.  Risk Prescription drug management.   ***  {Document critical care time when appropriate:1} {Document review of labs and clinical decision tools ie heart score, Chads2Vasc2 etc:1}  {Document your independent review of radiology images, and any outside records:1} {Document your discussion with family members,  caretakers, and with consultants:1} {Document social determinants of health affecting pt's care:1} {Document your decision making why or why not admission, treatments were needed:1} Final Clinical Impression(s) / ED Diagnoses Final diagnoses:  None    Rx / DC Orders ED Discharge Orders     None

## 2021-09-29 NOTE — Discharge Instructions (Signed)
Return for any problem.  ?

## 2021-09-29 NOTE — ED Triage Notes (Signed)
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 Dialysis MWF, last dialysis this AM, unable to complete treatment. Tramadol, oxy 5-325 for pain, did not help ? ?L arm restriction ?

## 2021-09-29 NOTE — ED Provider Triage Note (Signed)
Emergency Medicine Provider Triage Evaluation Note ? ?Michael Riggs , a 51 y.o. male  was evaluated in triage.  Pt complains of left leg pain to his right BKA that started today. He was not able to finish dialysis due to sxs, denies hx similar pain. ? ?Review of Systems  ?Positive: Leg pain ?Negative: fever ? ?Physical Exam  ?There were no vitals taken for this visit. ?Gen:   Awake, no distress   ?Resp:  Normal effort  ?MSK:   Moves extremities without difficulty  ?Other:  Abrasion to the left BKA noted distally. No erythema or warmth noted. No purulent drainage noted ? ?Medical Decision Making  ?Medically screening exam initiated at 6:21 PM.  Appropriate orders placed.  Michael Riggs was informed that the remainder of the evaluation will be completed by another provider, this initial triage assessment does not replace that evaluation, and the importance of remaining in the ED until their evaluation is complete. ? ? ?  ?Rodney Booze, PA-C ?09/29/21 1822 ? ?

## 2021-10-06 ENCOUNTER — Emergency Department (HOSPITAL_COMMUNITY): Payer: Medicare Other

## 2021-10-06 ENCOUNTER — Other Ambulatory Visit: Payer: Self-pay

## 2021-10-06 ENCOUNTER — Inpatient Hospital Stay (HOSPITAL_COMMUNITY)
Admission: EM | Admit: 2021-10-06 | Discharge: 2021-10-16 | DRG: 252 | Disposition: A | Discharge status: Home / Self Care | Payer: Medicare Other | Source: Ambulatory Visit | Attending: Family Medicine | Admitting: Family Medicine

## 2021-10-06 ENCOUNTER — Encounter (HOSPITAL_COMMUNITY): Payer: Self-pay

## 2021-10-06 DIAGNOSIS — I70233 Atherosclerosis of native arteries of right leg with ulceration of ankle: Secondary | ICD-10-CM | POA: Diagnosis not present

## 2021-10-06 DIAGNOSIS — S91001A Unspecified open wound, right ankle, initial encounter: Secondary | ICD-10-CM | POA: Diagnosis present

## 2021-10-06 DIAGNOSIS — R55 Syncope and collapse: Secondary | ICD-10-CM | POA: Diagnosis present

## 2021-10-06 DIAGNOSIS — E1122 Type 2 diabetes mellitus with diabetic chronic kidney disease: Secondary | ICD-10-CM | POA: Diagnosis present

## 2021-10-06 DIAGNOSIS — L97419 Non-pressure chronic ulcer of right heel and midfoot with unspecified severity: Secondary | ICD-10-CM | POA: Diagnosis present

## 2021-10-06 DIAGNOSIS — N2581 Secondary hyperparathyroidism of renal origin: Secondary | ICD-10-CM | POA: Diagnosis present

## 2021-10-06 DIAGNOSIS — Z992 Dependence on renal dialysis: Secondary | ICD-10-CM

## 2021-10-06 DIAGNOSIS — Z833 Family history of diabetes mellitus: Secondary | ICD-10-CM

## 2021-10-06 DIAGNOSIS — E1152 Type 2 diabetes mellitus with diabetic peripheral angiopathy with gangrene: Secondary | ICD-10-CM | POA: Diagnosis present

## 2021-10-06 DIAGNOSIS — E11621 Type 2 diabetes mellitus with foot ulcer: Secondary | ICD-10-CM | POA: Diagnosis present

## 2021-10-06 DIAGNOSIS — S0003XA Contusion of scalp, initial encounter: Secondary | ICD-10-CM | POA: Diagnosis present

## 2021-10-06 DIAGNOSIS — Z91048 Other nonmedicinal substance allergy status: Secondary | ICD-10-CM

## 2021-10-06 DIAGNOSIS — I132 Hypertensive heart and chronic kidney disease with heart failure and with stage 5 chronic kidney disease, or end stage renal disease: Secondary | ICD-10-CM | POA: Diagnosis present

## 2021-10-06 DIAGNOSIS — I272 Pulmonary hypertension, unspecified: Secondary | ICD-10-CM | POA: Diagnosis present

## 2021-10-06 DIAGNOSIS — S80211A Abrasion, right knee, initial encounter: Secondary | ICD-10-CM | POA: Diagnosis present

## 2021-10-06 DIAGNOSIS — I953 Hypotension of hemodialysis: Secondary | ICD-10-CM | POA: Diagnosis present

## 2021-10-06 DIAGNOSIS — S91309A Unspecified open wound, unspecified foot, initial encounter: Secondary | ICD-10-CM | POA: Diagnosis present

## 2021-10-06 DIAGNOSIS — D689 Coagulation defect, unspecified: Secondary | ICD-10-CM | POA: Diagnosis present

## 2021-10-06 DIAGNOSIS — J9811 Atelectasis: Secondary | ICD-10-CM | POA: Diagnosis present

## 2021-10-06 DIAGNOSIS — K219 Gastro-esophageal reflux disease without esophagitis: Secondary | ICD-10-CM | POA: Diagnosis present

## 2021-10-06 DIAGNOSIS — D631 Anemia in chronic kidney disease: Secondary | ICD-10-CM | POA: Diagnosis present

## 2021-10-06 DIAGNOSIS — F1021 Alcohol dependence, in remission: Secondary | ICD-10-CM | POA: Diagnosis present

## 2021-10-06 DIAGNOSIS — B9561 Methicillin susceptible Staphylococcus aureus infection as the cause of diseases classified elsewhere: Secondary | ICD-10-CM | POA: Diagnosis present

## 2021-10-06 DIAGNOSIS — I5042 Chronic combined systolic (congestive) and diastolic (congestive) heart failure: Secondary | ICD-10-CM | POA: Diagnosis present

## 2021-10-06 DIAGNOSIS — E871 Hypo-osmolality and hyponatremia: Secondary | ICD-10-CM | POA: Diagnosis not present

## 2021-10-06 DIAGNOSIS — M86171 Other acute osteomyelitis, right ankle and foot: Secondary | ICD-10-CM | POA: Diagnosis not present

## 2021-10-06 DIAGNOSIS — Z79899 Other long term (current) drug therapy: Secondary | ICD-10-CM

## 2021-10-06 DIAGNOSIS — L03115 Cellulitis of right lower limb: Secondary | ICD-10-CM | POA: Diagnosis present

## 2021-10-06 DIAGNOSIS — Z89512 Acquired absence of left leg below knee: Secondary | ICD-10-CM

## 2021-10-06 DIAGNOSIS — Z8616 Personal history of COVID-19: Secondary | ICD-10-CM | POA: Diagnosis not present

## 2021-10-06 DIAGNOSIS — E669 Obesity, unspecified: Secondary | ICD-10-CM | POA: Diagnosis present

## 2021-10-06 DIAGNOSIS — F419 Anxiety disorder, unspecified: Secondary | ICD-10-CM | POA: Diagnosis present

## 2021-10-06 DIAGNOSIS — M65871 Other synovitis and tenosynovitis, right ankle and foot: Secondary | ICD-10-CM | POA: Diagnosis present

## 2021-10-06 DIAGNOSIS — Z6833 Body mass index (BMI) 33.0-33.9, adult: Secondary | ICD-10-CM

## 2021-10-06 DIAGNOSIS — I739 Peripheral vascular disease, unspecified: Secondary | ICD-10-CM

## 2021-10-06 DIAGNOSIS — Z87891 Personal history of nicotine dependence: Secondary | ICD-10-CM | POA: Diagnosis not present

## 2021-10-06 DIAGNOSIS — W19XXXA Unspecified fall, initial encounter: Secondary | ICD-10-CM | POA: Diagnosis present

## 2021-10-06 DIAGNOSIS — G319 Degenerative disease of nervous system, unspecified: Secondary | ICD-10-CM | POA: Diagnosis present

## 2021-10-06 DIAGNOSIS — I358 Other nonrheumatic aortic valve disorders: Secondary | ICD-10-CM | POA: Diagnosis present

## 2021-10-06 DIAGNOSIS — Z807 Family history of other malignant neoplasms of lymphoid, hematopoietic and related tissues: Secondary | ICD-10-CM

## 2021-10-06 DIAGNOSIS — N186 End stage renal disease: Secondary | ICD-10-CM | POA: Diagnosis present

## 2021-10-06 DIAGNOSIS — I2510 Atherosclerotic heart disease of native coronary artery without angina pectoris: Secondary | ICD-10-CM | POA: Diagnosis present

## 2021-10-06 DIAGNOSIS — H5509 Other forms of nystagmus: Secondary | ICD-10-CM | POA: Diagnosis present

## 2021-10-06 DIAGNOSIS — I6339 Cerebral infarction due to thrombosis of other cerebral artery: Secondary | ICD-10-CM | POA: Diagnosis present

## 2021-10-06 DIAGNOSIS — G4733 Obstructive sleep apnea (adult) (pediatric): Secondary | ICD-10-CM | POA: Diagnosis present

## 2021-10-06 DIAGNOSIS — E1140 Type 2 diabetes mellitus with diabetic neuropathy, unspecified: Secondary | ICD-10-CM | POA: Diagnosis present

## 2021-10-06 DIAGNOSIS — I639 Cerebral infarction, unspecified: Secondary | ICD-10-CM | POA: Diagnosis not present

## 2021-10-06 DIAGNOSIS — Z8673 Personal history of transient ischemic attack (TIA), and cerebral infarction without residual deficits: Secondary | ICD-10-CM | POA: Diagnosis not present

## 2021-10-06 DIAGNOSIS — N529 Male erectile dysfunction, unspecified: Secondary | ICD-10-CM | POA: Diagnosis present

## 2021-10-06 DIAGNOSIS — I429 Cardiomyopathy, unspecified: Secondary | ICD-10-CM | POA: Diagnosis not present

## 2021-10-06 DIAGNOSIS — R7881 Bacteremia: Secondary | ICD-10-CM | POA: Diagnosis not present

## 2021-10-06 DIAGNOSIS — I70221 Atherosclerosis of native arteries of extremities with rest pain, right leg: Secondary | ICD-10-CM | POA: Diagnosis present

## 2021-10-06 DIAGNOSIS — E113599 Type 2 diabetes mellitus with proliferative diabetic retinopathy without macular edema, unspecified eye: Secondary | ICD-10-CM | POA: Diagnosis present

## 2021-10-06 DIAGNOSIS — I6523 Occlusion and stenosis of bilateral carotid arteries: Secondary | ICD-10-CM | POA: Diagnosis not present

## 2021-10-06 DIAGNOSIS — E785 Hyperlipidemia, unspecified: Secondary | ICD-10-CM | POA: Diagnosis present

## 2021-10-06 DIAGNOSIS — I6330 Cerebral infarction due to thrombosis of unspecified cerebral artery: Secondary | ICD-10-CM | POA: Diagnosis not present

## 2021-10-06 DIAGNOSIS — Z9714 Presence of artificial left leg (complete) (partial): Secondary | ICD-10-CM

## 2021-10-06 DIAGNOSIS — N185 Chronic kidney disease, stage 5: Secondary | ICD-10-CM | POA: Diagnosis not present

## 2021-10-06 DIAGNOSIS — L97318 Non-pressure chronic ulcer of right ankle with other specified severity: Secondary | ICD-10-CM | POA: Diagnosis not present

## 2021-10-06 LAB — CBC WITH DIFFERENTIAL/PLATELET
Abs Immature Granulocytes: 0.09 10*3/uL — ABNORMAL HIGH (ref 0.00–0.07)
Basophils Absolute: 0.1 10*3/uL (ref 0.0–0.1)
Basophils Relative: 1 %
Eosinophils Absolute: 0.2 10*3/uL (ref 0.0–0.5)
Eosinophils Relative: 1 %
HCT: 42.1 % (ref 39.0–52.0)
Hemoglobin: 13.2 g/dL (ref 13.0–17.0)
Immature Granulocytes: 1 %
Lymphocytes Relative: 5 %
Lymphs Abs: 0.6 10*3/uL — ABNORMAL LOW (ref 0.7–4.0)
MCH: 32.4 pg (ref 26.0–34.0)
MCHC: 31.4 g/dL (ref 30.0–36.0)
MCV: 103.2 fL — ABNORMAL HIGH (ref 80.0–100.0)
Monocytes Absolute: 0.5 10*3/uL (ref 0.1–1.0)
Monocytes Relative: 4 %
Neutro Abs: 10.5 10*3/uL — ABNORMAL HIGH (ref 1.7–7.7)
Neutrophils Relative %: 88 %
Platelets: 202 10*3/uL (ref 150–400)
RBC: 4.08 MIL/uL — ABNORMAL LOW (ref 4.22–5.81)
RDW: 16.3 % — ABNORMAL HIGH (ref 11.5–15.5)
WBC: 11.9 10*3/uL — ABNORMAL HIGH (ref 4.0–10.5)
nRBC: 0 % (ref 0.0–0.2)

## 2021-10-06 LAB — LACTIC ACID, PLASMA: Lactic Acid, Venous: 1.1 mmol/L (ref 0.5–1.9)

## 2021-10-06 LAB — CBG MONITORING, ED: Glucose-Capillary: 110 mg/dL — ABNORMAL HIGH (ref 70–99)

## 2021-10-06 LAB — COMPREHENSIVE METABOLIC PANEL
ALT: 15 U/L (ref 0–44)
AST: 19 U/L (ref 15–41)
Albumin: 3.2 g/dL — ABNORMAL LOW (ref 3.5–5.0)
Alkaline Phosphatase: 92 U/L (ref 38–126)
Anion gap: 13 (ref 5–15)
BUN: 45 mg/dL — ABNORMAL HIGH (ref 6–20)
CO2: 29 mmol/L (ref 22–32)
Calcium: 9 mg/dL (ref 8.9–10.3)
Chloride: 91 mmol/L — ABNORMAL LOW (ref 98–111)
Creatinine, Ser: 8.17 mg/dL — ABNORMAL HIGH (ref 0.61–1.24)
GFR, Estimated: 7 mL/min — ABNORMAL LOW (ref >60)
Glucose, Bld: 118 mg/dL — ABNORMAL HIGH (ref 70–99)
Potassium: 4.7 mmol/L (ref 3.5–5.1)
Sodium: 133 mmol/L — ABNORMAL LOW (ref 135–145)
Total Bilirubin: 1.5 mg/dL — ABNORMAL HIGH (ref 0.3–1.2)
Total Protein: 7.3 g/dL (ref 6.5–8.1)

## 2021-10-06 LAB — GLUCOSE, CAPILLARY: Glucose-Capillary: 130 mg/dL — ABNORMAL HIGH (ref 70–99)

## 2021-10-06 IMAGING — MR MR HEAD W/O CM
12 of 13 series · 44 of 48 positions shown · non-contrast
Comparison: Prior head CT examinations [DATE] and earlier.

CLINICAL DATA: Neuro deficit, acute, stroke suspected.

EXAM:
MRI HEAD WITHOUT CONTRAST
TECHNIQUE: Multiplanar, multiecho pulse sequences of the brain and surrounding
structures were obtained without intravenous contrast.

[Series 5: DWI · axial · 3.0mm · 0.96mm/px · z∈[-87,+65]mm · 7 of 104 slices shown (1 of 4)]
[im 1/104]
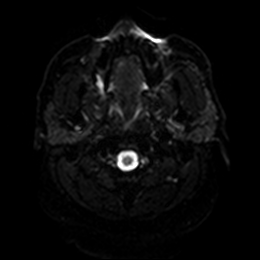
[im 18/104]
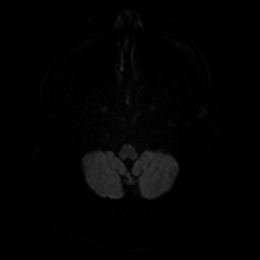
[im 35/104]
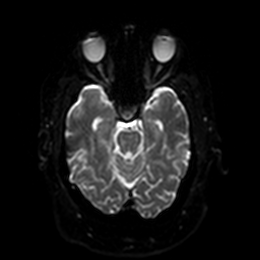
[im 52/104]
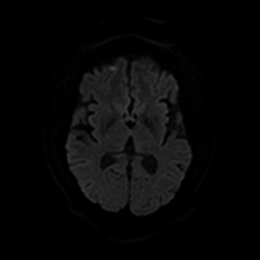
[im 69/104]
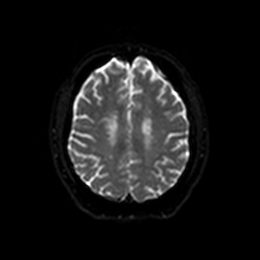
[im 86/104]
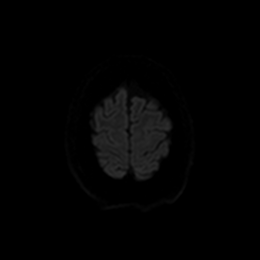
[im 104/104]
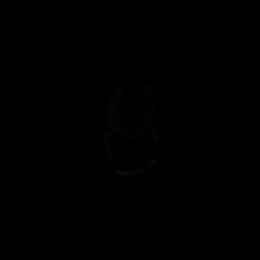

[Series 6: DWI · axial · 3.0mm · 0.96mm/px · z∈[-87,+65]mm · 4 of 52 slices shown (2 of 4)]
[im 1/52]
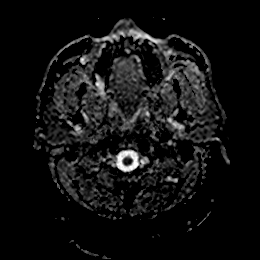
[im 18/52]
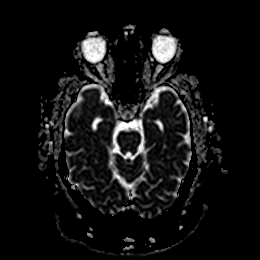
[im 35/52]
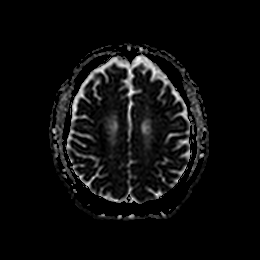
[im 52/52]
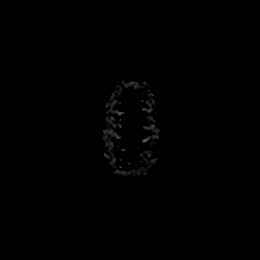

[Series 7: DWI · coronal · 4.0mm · 0.88mm/px · 6 of 72 slices shown (3 of 4)]
[im 1/72]
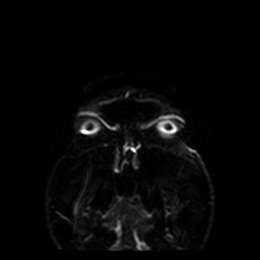
[im 15/72]
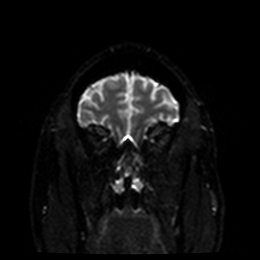
[im 29/72]
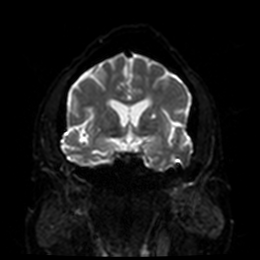
[im 43/72]
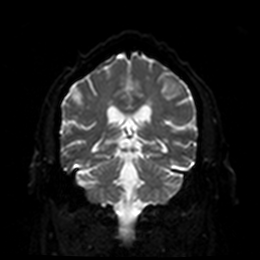
[im 57/72]
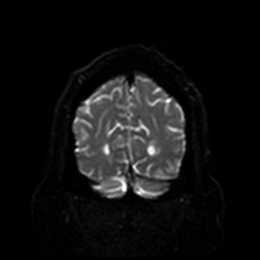
[im 72/72]
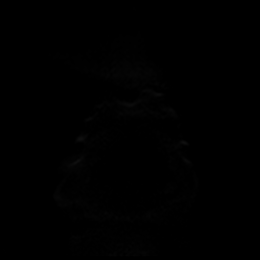

[Series 8: DWI · coronal · 4.0mm · 0.88mm/px · 3 of 36 slices shown (4 of 4)]
[im 1/36]
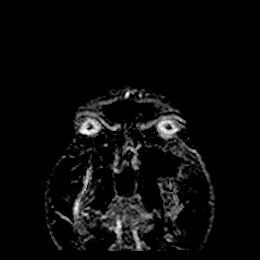
[im 18/36]
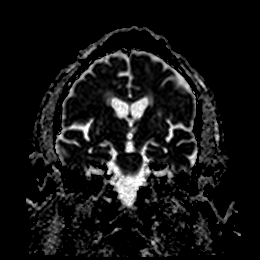
[im 36/36]
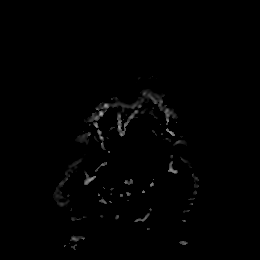

[Series 9: T1 · sagittal · 5.0mm · 0.75mm/px · 2 of 23 slices shown]
[im 1/23]
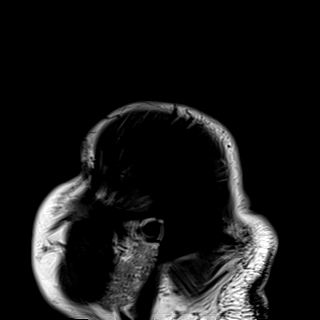
[im 23/23]
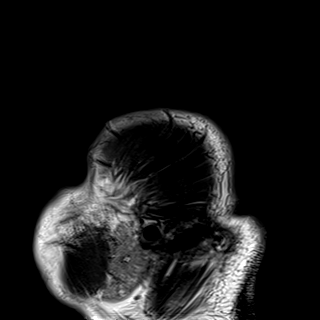

[Series 10: T2 · axial · 5.0mm · 0.78mm/px · z∈[-72,+71]mm · 2 of 25 slices shown (1 of 2)]
[im 1/25]
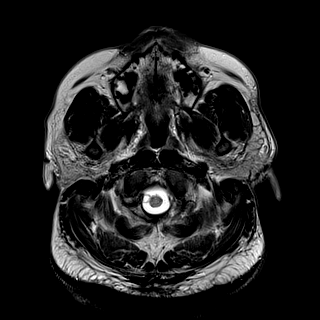
[im 25/25]
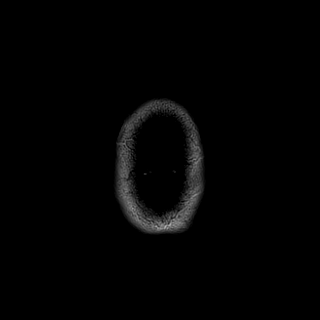

[Series 12: mag_images · axial · 3.0mm · 0.98mm/px · z∈[-72,+81]mm · 4 of 52 slices shown]
[im 1/52]
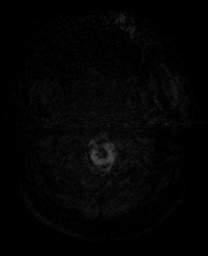
[im 18/52]
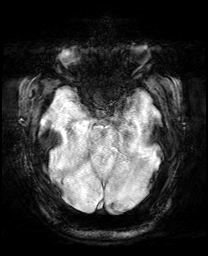
[im 35/52]
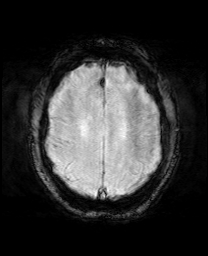
[im 52/52]
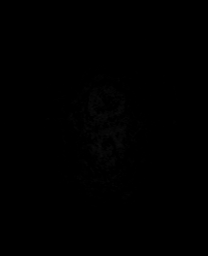

[Series 13: pha_images · axial · 3.0mm · 0.98mm/px · z∈[-72,+78]mm · 4 of 51 slices shown]
[im 1/51]
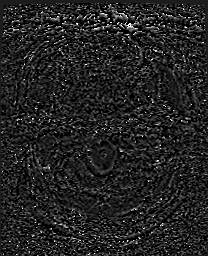
[im 17/51]
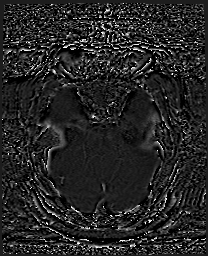
[im 34/51]
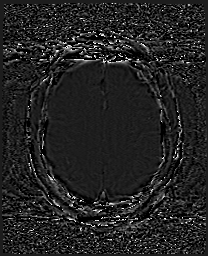
[im 51/51]
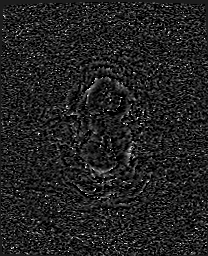

[Series 14: swi_images · axial · 3.0mm · 0.98mm/px · z∈[-72,+81]mm · 4 of 52 slices shown]
[im 1/52]
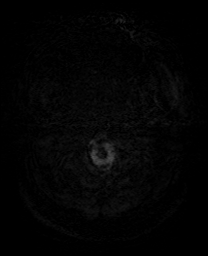
[im 18/52]
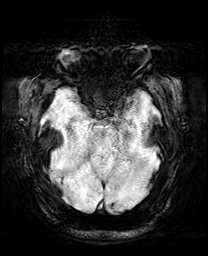
[im 35/52]
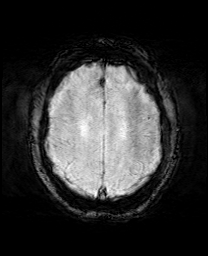
[im 52/52]
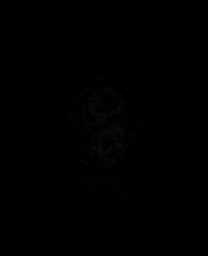

[Series 15: mip_images(sw) · axial · 24.0mm · 0.98mm/px · z∈[-62,+70]mm · 4 of 45 slices shown]
[im 1/45]
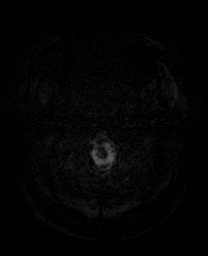
[im 15/45]
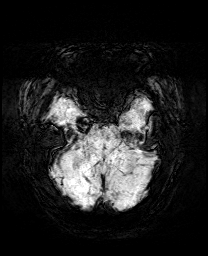
[im 30/45]
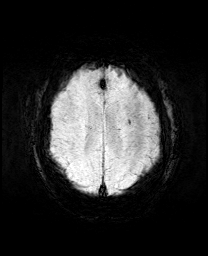
[im 45/45]
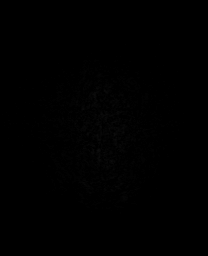

[Series 17: FLAIR · axial · 5.0mm · 0.98mm/px · z∈[-72,+71]mm · 2 of 25 slices shown]
[im 1/25]
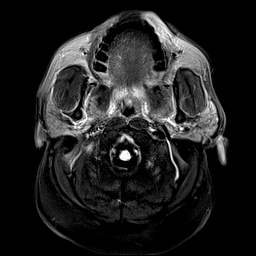
[im 25/25]
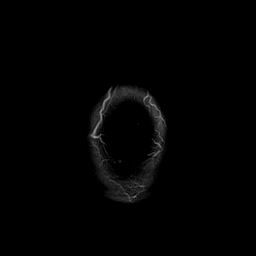

[Series 18: T2 · coronal · 5.0mm · 0.72mm/px · 2 of 27 slices shown (2 of 2)]
[im 1/27]
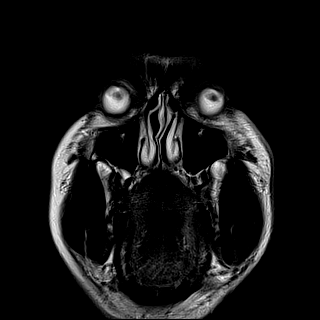
[im 27/27]
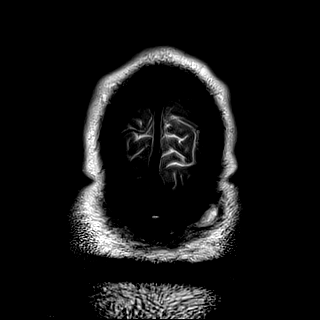

[44 of 48 positions shown; findings below may reference images not displayed]

FINDINGS: Mild intermittent motion degradation.

Brain:

Mild generalized cerebral atrophy.

There are two punctate foci of diffusion-weighted hyperintensity
within the left ventral pons (series 5, image 69) (series 7, image
55). These may reflect punctate acute infarcts or artifact.
Bilateral chronic lacunar infarcts, and background chronic small
vessel ischemic disease, also present within the pons.

Punctate focus of diffusion weighted signal hyperintensity within
the right parietooccipital cortex, which may reflect a punctate
acute infarct or artifact (series 5, image 84) (series 7, image 40).

Chronic lacunar infarcts within the left lentiform nucleus, anterior
limb of left internal capsule and left thalamus. Chronic hemosiderin
deposition associated with the chronic lacunar infarct within the
left lentiform nucleus.

Multifocal T2 FLAIR hyperintense signal abnormality elsewhere within
the cerebral white matter nonspecific but compatible with chronic
small vessel ischemic disease. A small focus of diffusion-weighted
signal hyperintensity within the right frontal lobe subcortical
white matter demonstrates corresponding T2 shinethrough on the ADC
map (series 5, image 88) (series 6, image 36).

No evidence of an intracranial mass.

No extra-axial fluid collection.

No midline shift.

Vascular: Maintained flow voids within the proximal large arterial
vessels.

Skull and upper cervical spine: No focal suspicious marrow lesion.

Sinuses/Orbits: Visualized orbits show no acute finding. Small
mucous retention cyst within the right maxillary sinus. Trace
mucosal thickening within the right sphenoid and bilateral ethmoid
sinuses.
IMPRESSION: Two punctate foci of apparent diffusion-weighted signal
hyperintensity within the left ventral pons, which may reflect
punctate acute infarcts or artifact.

Punctate acute infarct versus artifact within the right
parietooccipital cortex.

Overall moderate chronic small vessel ischemic disease with multiple
chronic lacunar infarcts, as described.

Mild generalized cerebral atrophy.

Mild paranasal sinus disease, as described.

## 2021-10-06 IMAGING — CR DG ANKLE COMPLETE 3+V*R*
3 series · 4 of 4 positions shown · non-contrast
Comparison: Right tibia fibula [DATE].

CLINICAL DATA: Right foot wound, fall, initial encounter.

EXAM:
RIGHT ANKLE - COMPLETE 3+ VIEW

[ankle ap]
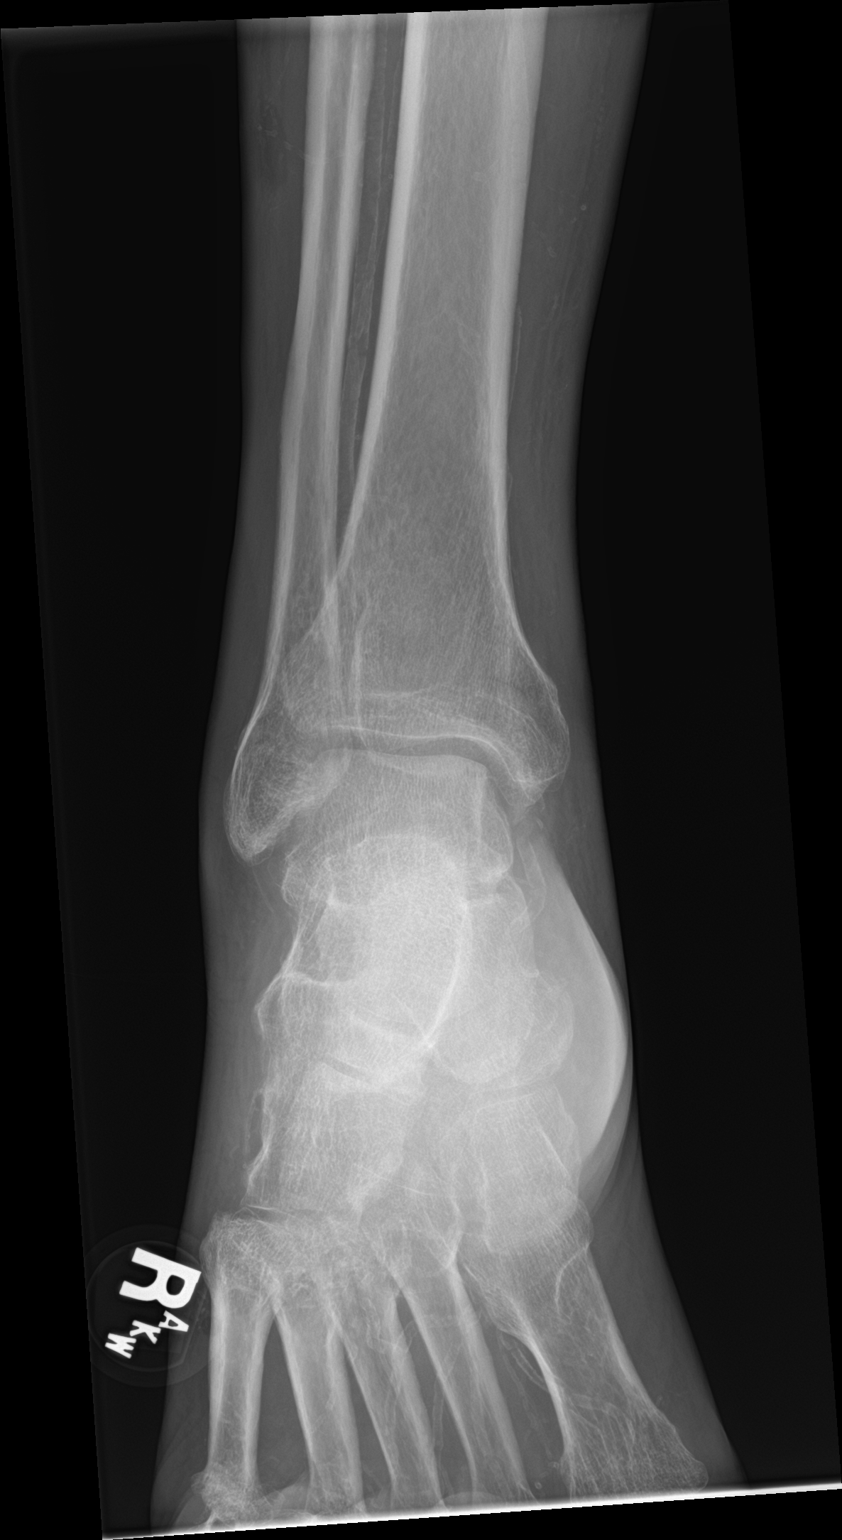

[Series 2: ankle obl · 0.14mm/px · 2 of 2 slices shown]
[im 1/2]
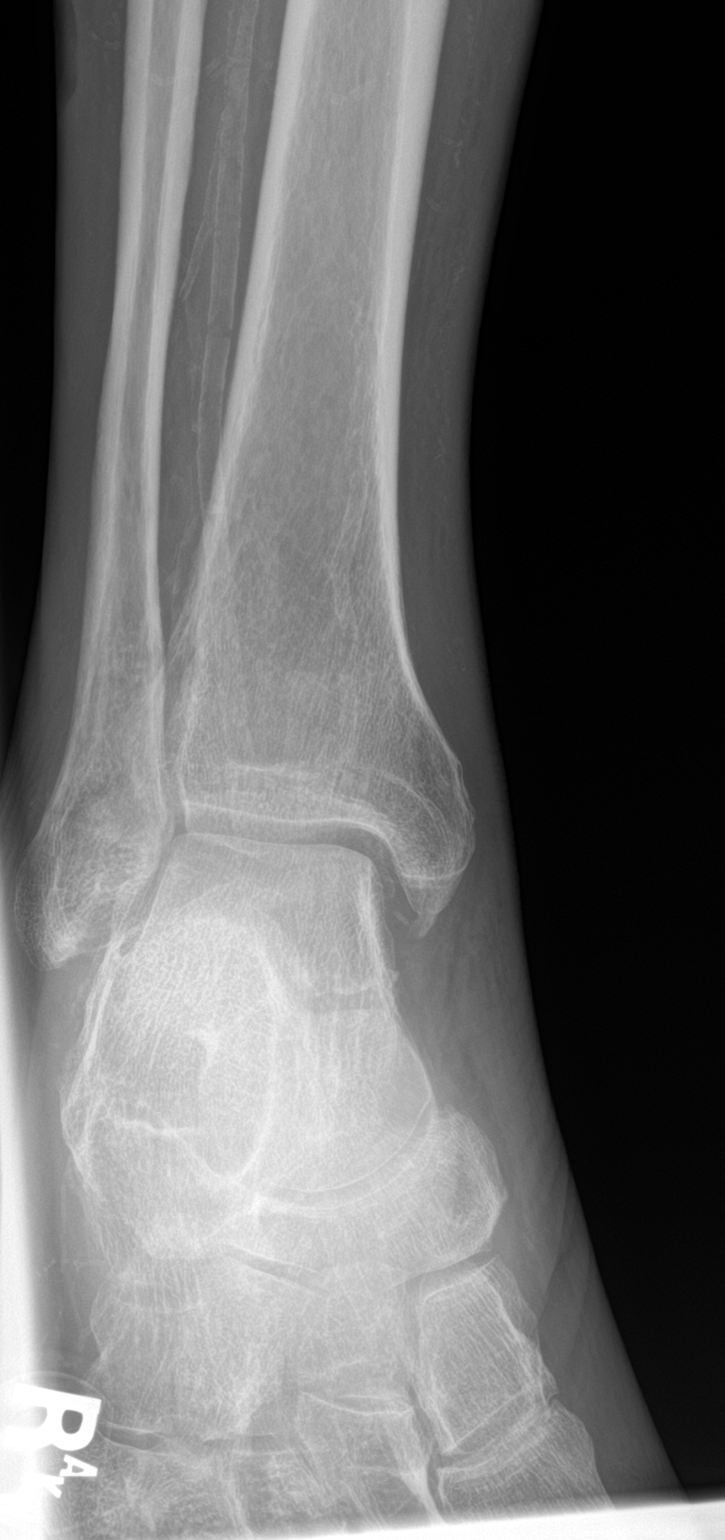
[im 2/2]
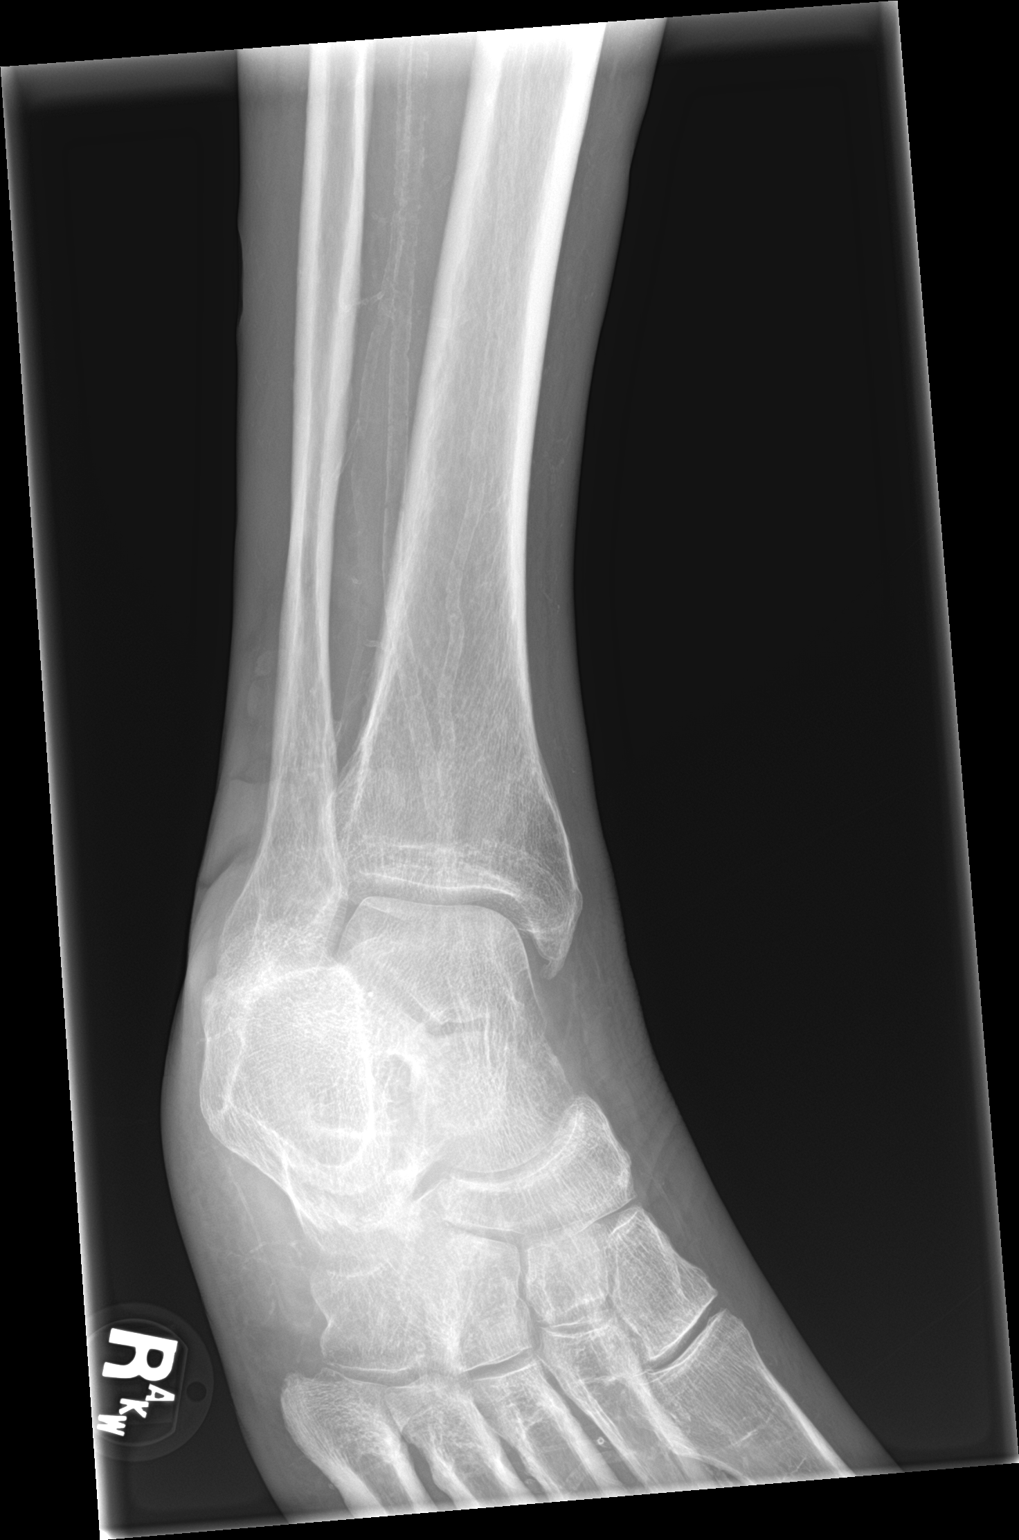

[ankle lat]
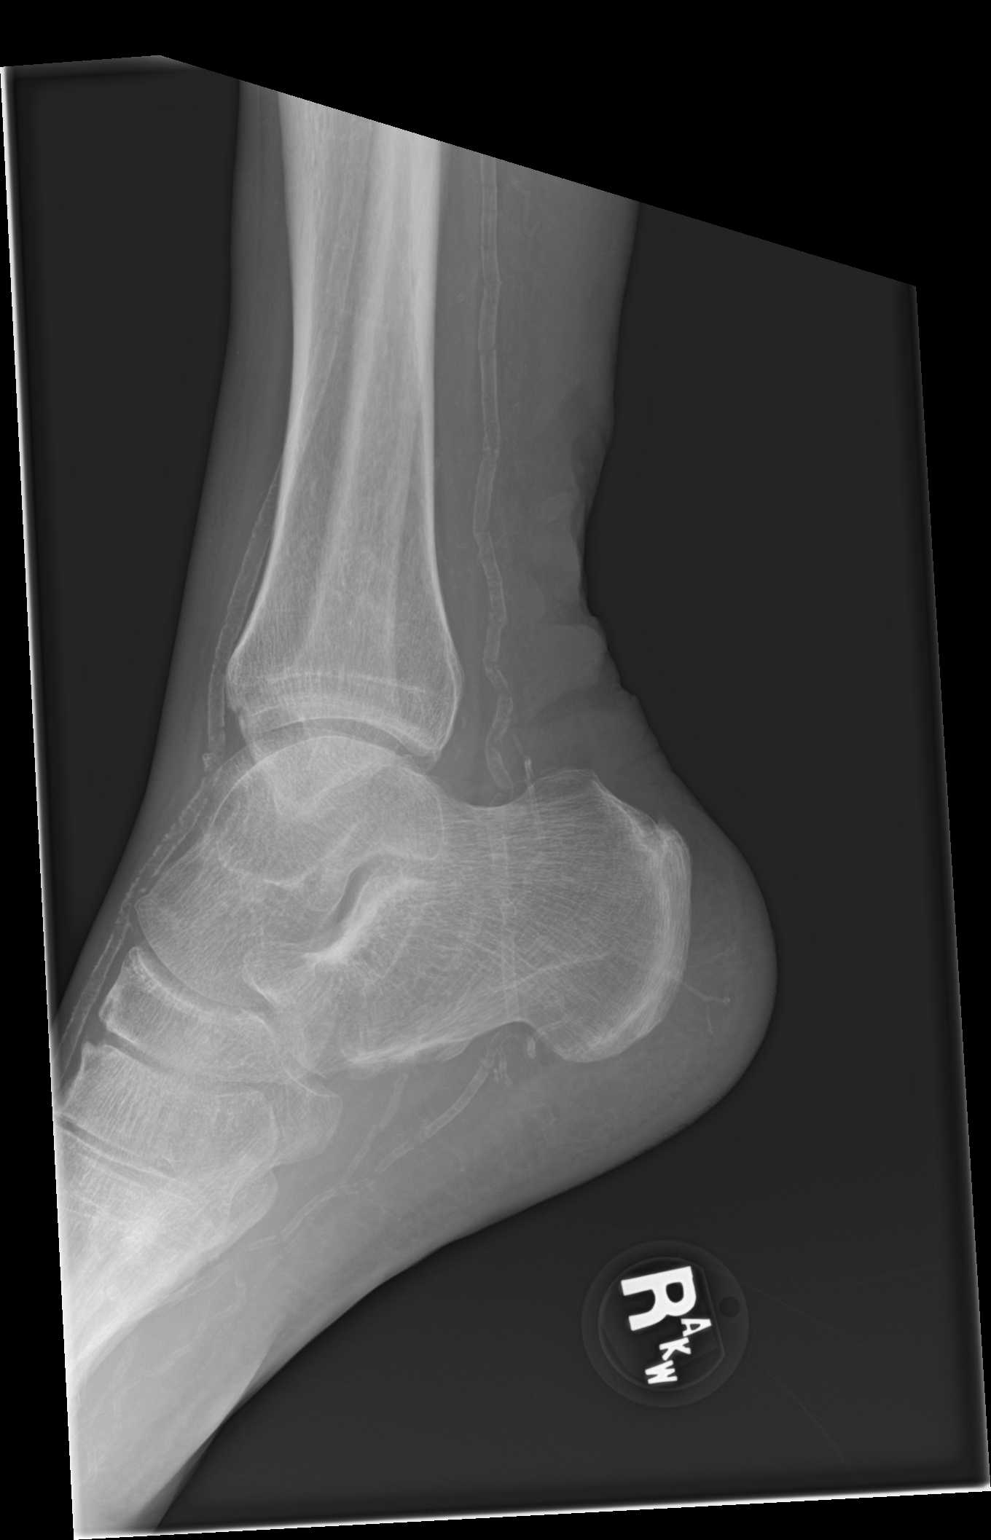

[4 of 4 positions shown; findings below may reference images not displayed]

FINDINGS: Possible nondisplaced lucency in the anteromedial distal tibial
epiphysis, seen on two views. Ankle mortise is intact. Vascular
calcifications.
IMPRESSION: Possible nondisplaced distal tibial fracture.

## 2021-10-06 IMAGING — MR MR ANKLE*R* W/O CM
5 series · 40 of 40 positions shown · non-contrast
Comparison: X-ray [DATE]

CLINICAL DATA: Osteomyelitis suspected, ankle, xray done

EXAM:
MRI OF THE RIGHT ANKLE WITHOUT CONTRAST
TECHNIQUE: Multiplanar, multisequence MR imaging of the ankle was performed. No
intravenous contrast was administered.

[Series 5: T2 fat-sat · coronal · right · 3.0mm · 0.49mm/px · 9 of 46 slices shown (1 of 2)]
[im 1/46]
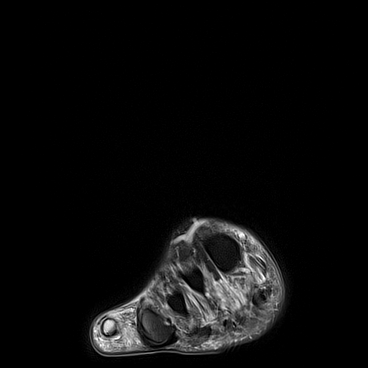
[im 6/46]
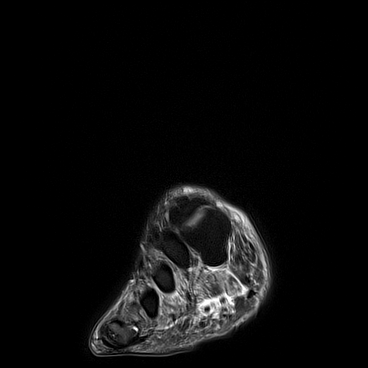
[im 12/46]
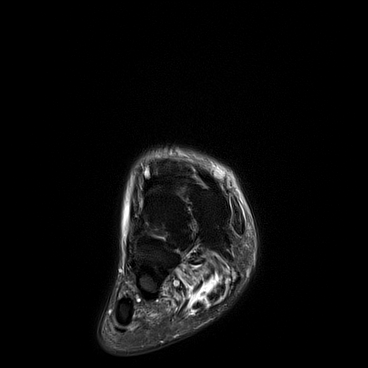
[im 17/46]
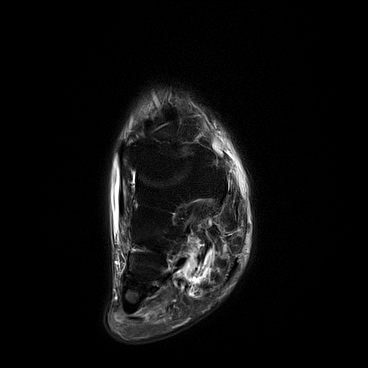
[im 23/46]
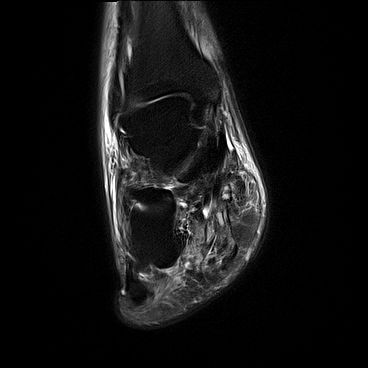
[im 29/46]
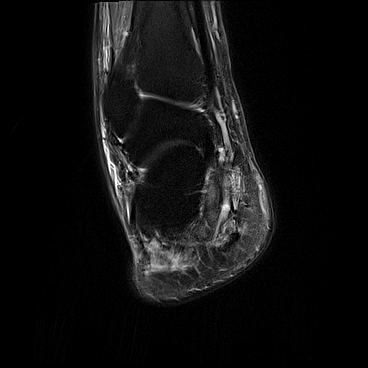
[im 34/46]
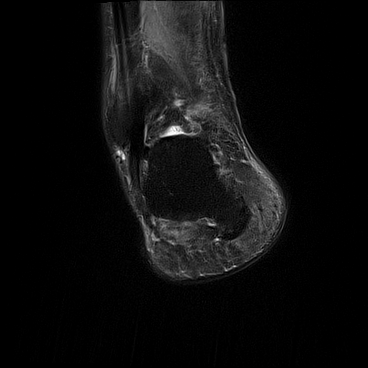
[im 40/46]
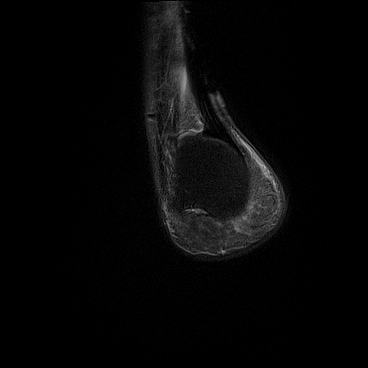
[im 46/46]
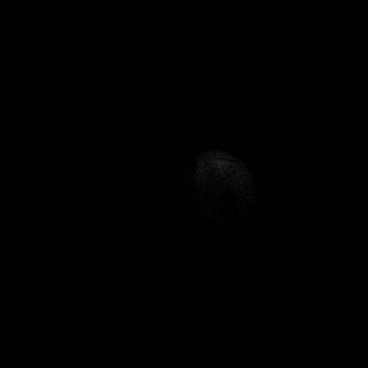

[Series 6: T1 · coronal · right · 3.0mm · 0.47mm/px · 8 of 46 slices shown (1 of 2)]
[im 1/46]
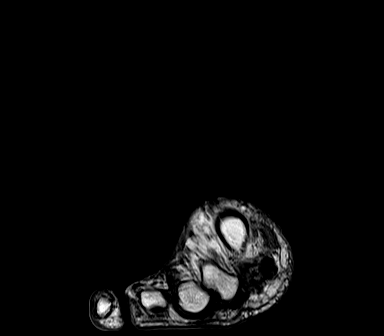
[im 7/46]
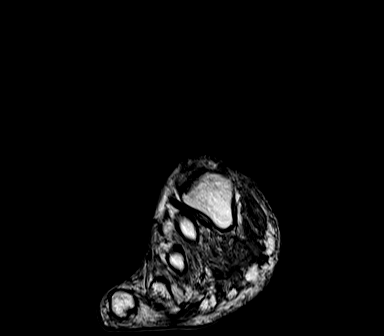
[im 13/46]
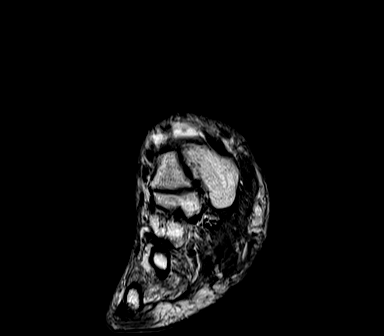
[im 20/46]
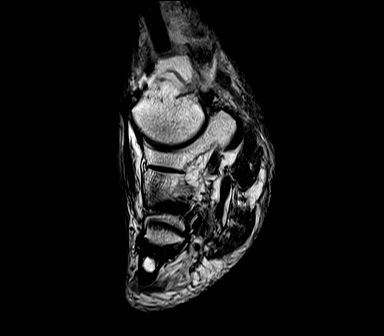
[im 26/46]
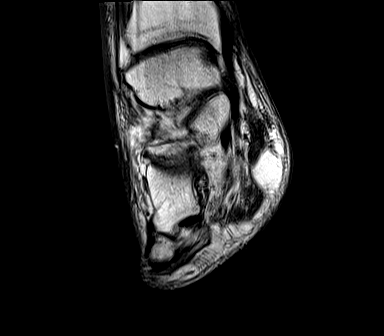
[im 33/46]
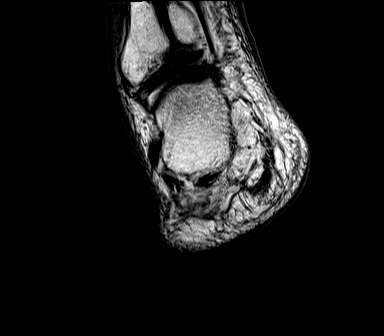
[im 39/46]
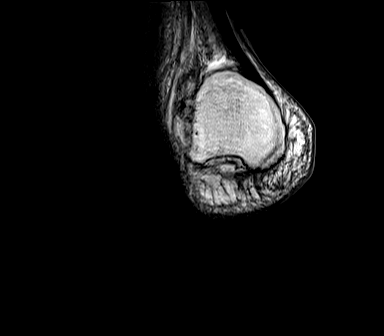
[im 46/46]
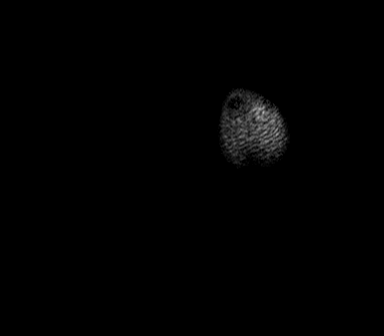

[Series 7: T1 · axial · right · 3.0mm · 0.52mm/px · z∈[-75,+111]mm · 8 of 48 slices shown (2 of 2)]
[im 1/48]
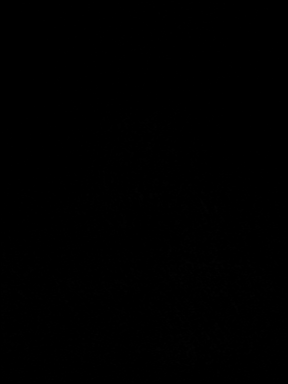
[im 7/48]
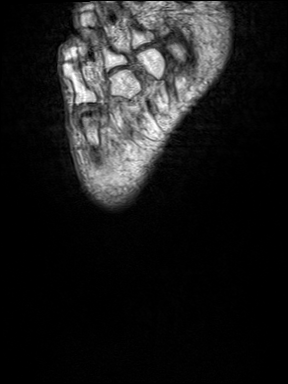
[im 14/48]
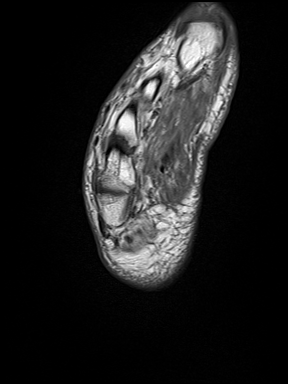
[im 21/48]
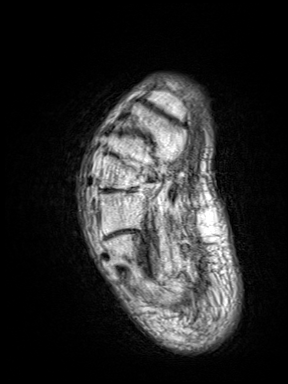
[im 27/48]
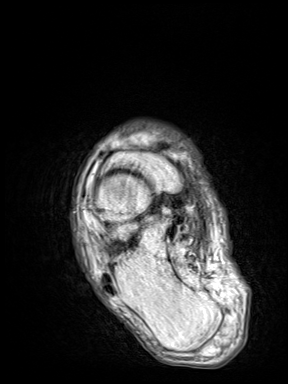
[im 34/48]
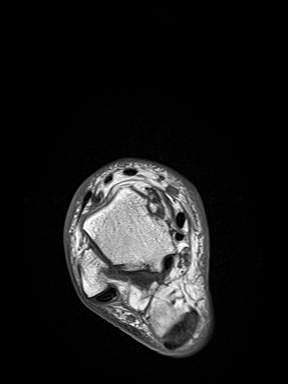
[im 41/48]
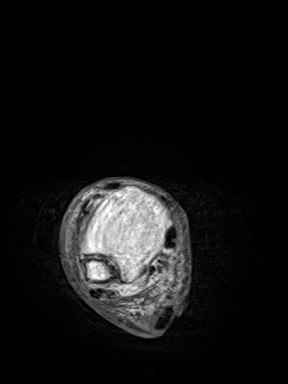
[im 48/48]
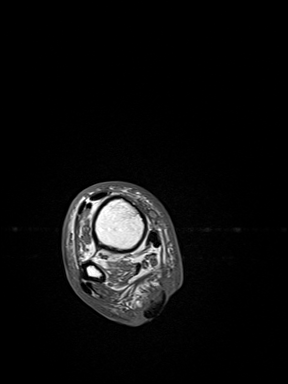

[Series 8: T2 fat-sat · axial · right · 3.0mm · 0.60mm/px · z∈[-75,+111]mm · 8 of 48 slices shown (2 of 2)]
[im 1/48]
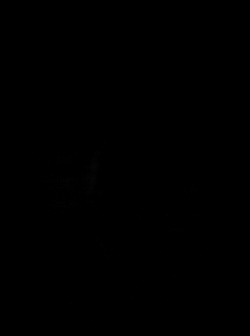
[im 7/48]
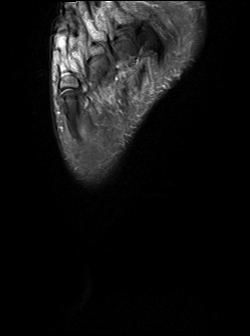
[im 14/48]
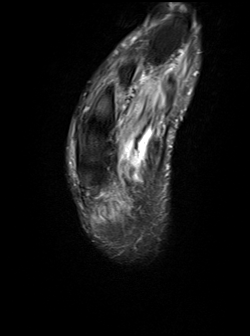
[im 21/48]
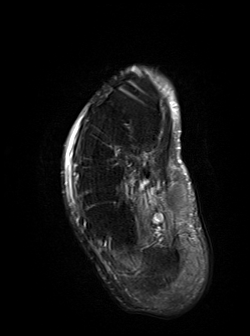
[im 27/48]
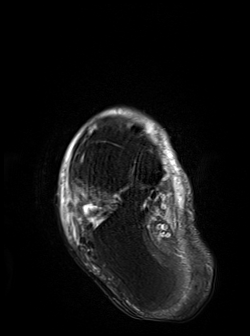
[im 34/48]
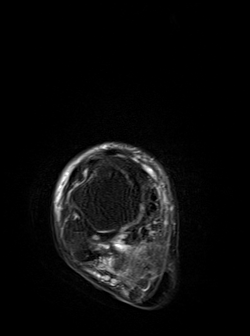
[im 41/48]
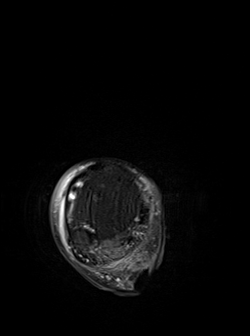
[im 48/48]
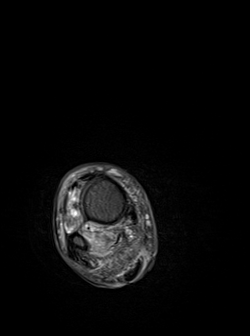

[Series 9: STIR · sagittal · right · 3.0mm · 0.70mm/px · 7 of 39 slices shown]
[im 1/39]
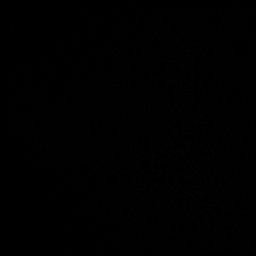
[im 7/39]
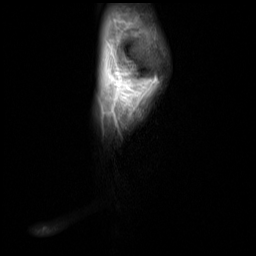
[im 13/39]
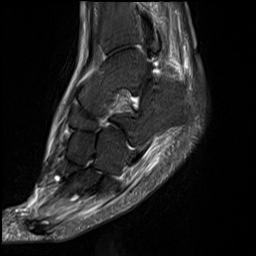
[im 20/39]
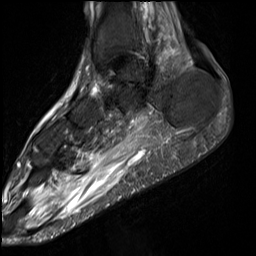
[im 26/39]
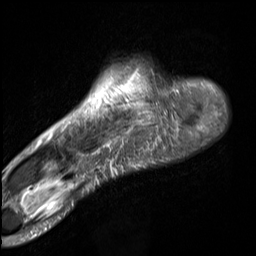
[im 32/39]
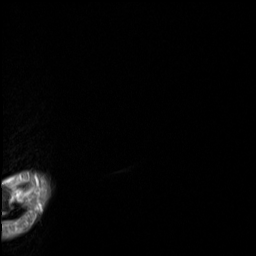
[im 39/39]
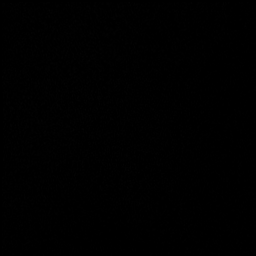

[40 of 40 positions shown; findings below may reference images not displayed]

FINDINGS: Technical Note: Despite efforts by the technologist and patient,
motion artifact is present on today's exam and could not be
eliminated. This reduces exam sensitivity and specificity.

Bones/Joint/Cartilage

No acute fracture. No dislocation. No bone marrow edema. No erosion
or periostitis. No significant arthropathy. No joint effusion.

Ligaments

No evidence of acute ligamentous injury of the ankle or hindfoot.

Muscles and Tendons

Tendinous structures of the ankle appear intact. Small volume
tenosynovial fluid associated with the flexor tendons within the
forefoot. Denervation changes of the visualized lower leg and foot
musculature.

Soft tissues

No discernible soft tissue wound or ulceration evident on motion
degraded images. Mild subcutaneous edema. No organized fluid
collection.
IMPRESSION: 1. Motion degraded images.
2. No acute osseous abnormality.  No evidence of osteomyelitis.
3. Mild tenosynovitis the flexor tendons within the forefoot,
nonspecific.

## 2021-10-06 IMAGING — CT CT HEAD W/O CM
2 of 5 series · 12 of 47 positions shown, 15 images · non-contrast
Comparison: Head CT [DATE].

CLINICAL DATA: Head trauma, intracranial venous injury suspected.
Facial trauma, blunt.

EXAM:
CT HEAD WITHOUT CONTRAST
CT MAXILLOFACIAL WITHOUT CONTRAST
TECHNIQUE: Multidetector CT imaging of the head and maxillofacial structures
were performed using the standard protocol without intravenous
contrast. Multiplanar CT image reconstructions of the maxillofacial
structures were also generated.
RADIATION DOSE REDUCTION: This exam was performed according to the
departmental dose-optimization program which includes automated
exposure control, adjustment of the mA and/or kV according to
patient size and/or use of iterative reconstruction technique.

[Series 5: head 3.0 mpr cor · coronal · 0.36mm/px · 3 of 76 slices shown]
[im 29/76  brain]
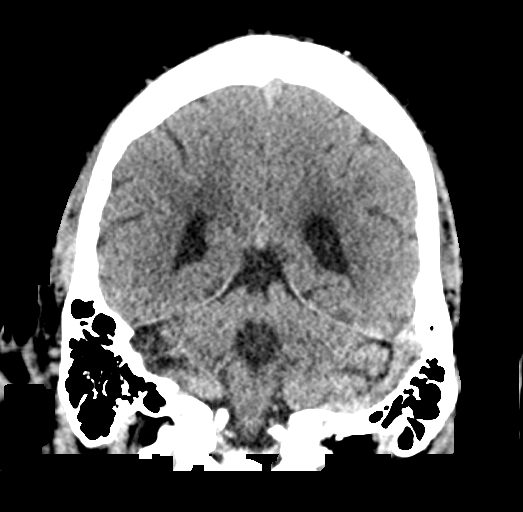
[im 35/76  brain]
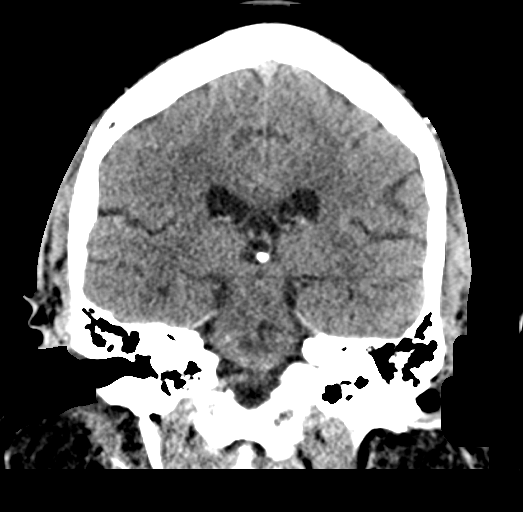
[im 41/76  brain]
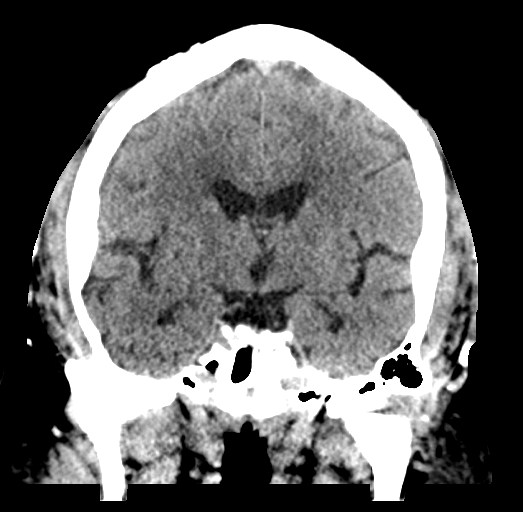

[Series 8: head 2.0 mpr ax · axial · 0.36mm/px · z∈[-61,+79]mm · 9 of 89 slices shown, 12 images]
[im 6/89  brain]
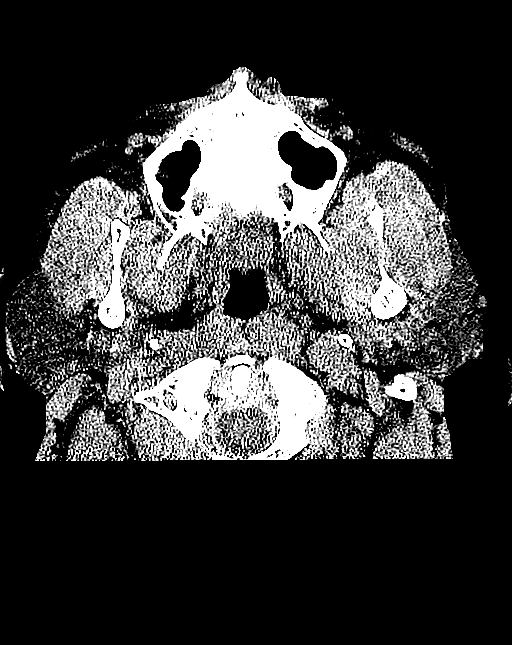
[im 6/89  bone]
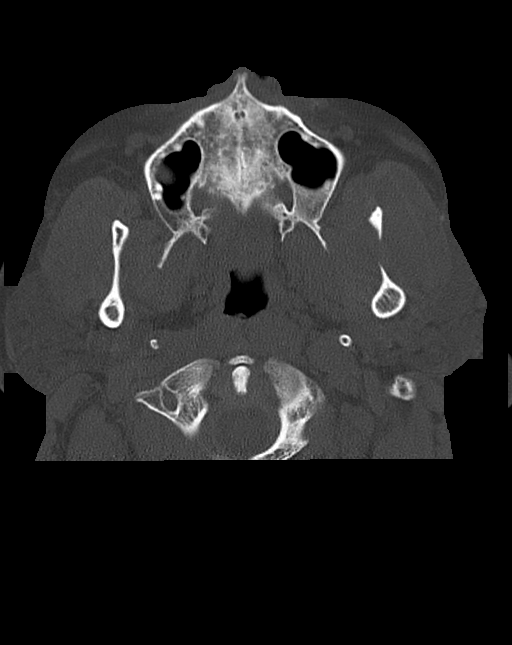
[im 17/89  brain]
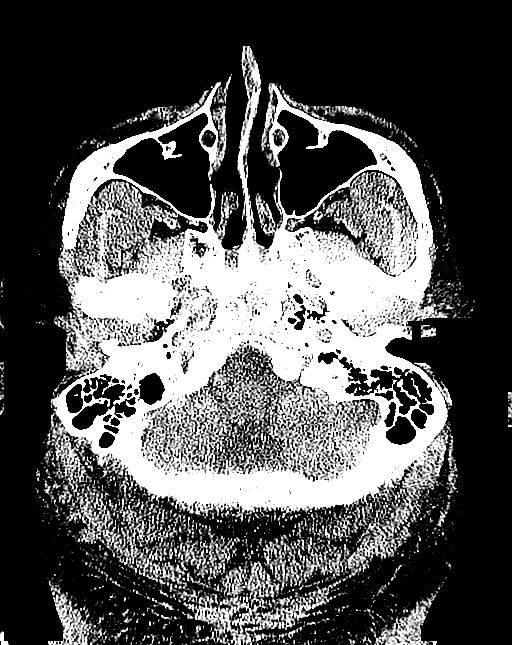
[im 28/89  brain]
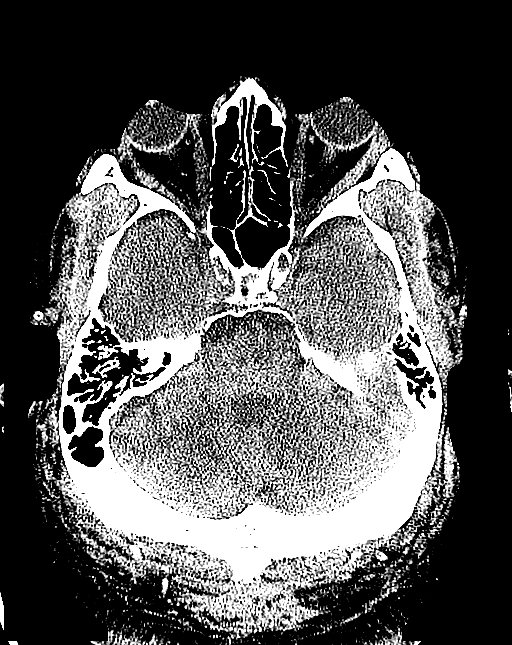
[im 34/89  brain]
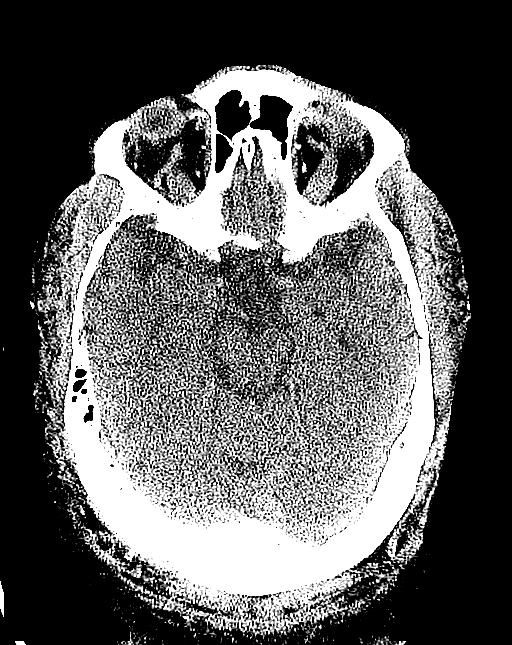
[im 45/89  brain]
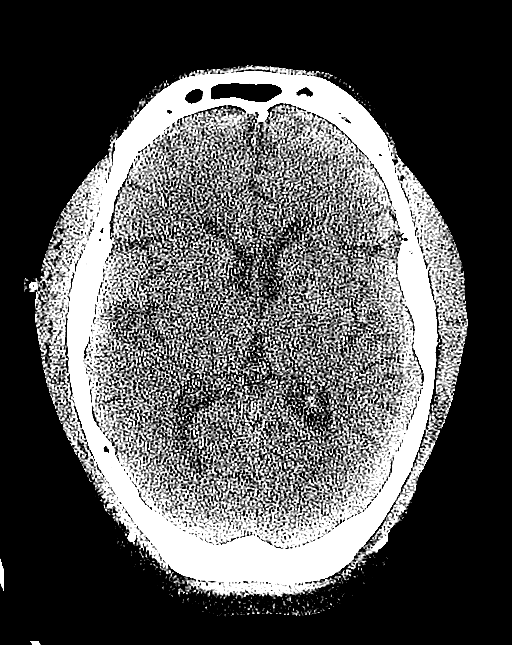
[im 45/89  bone]
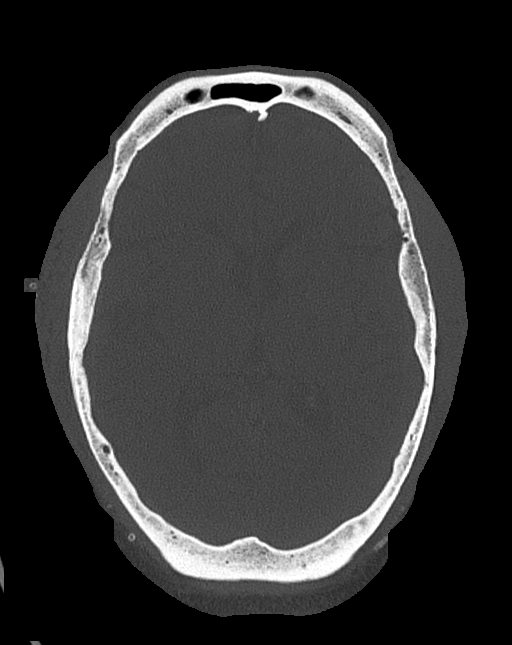
[im 56/89  brain]
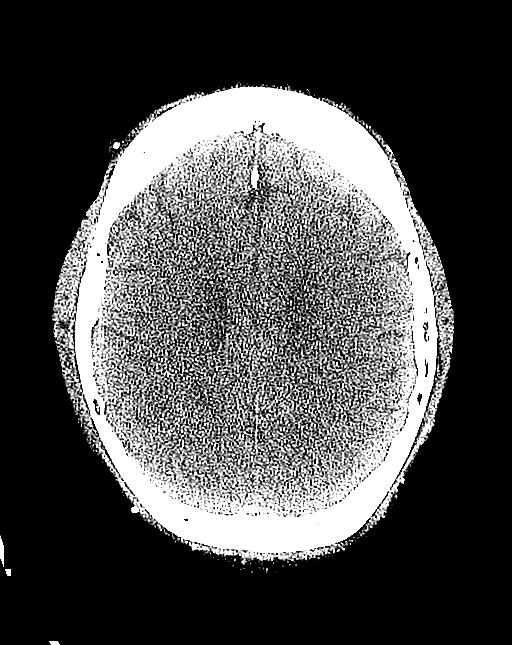
[im 61/89  brain]
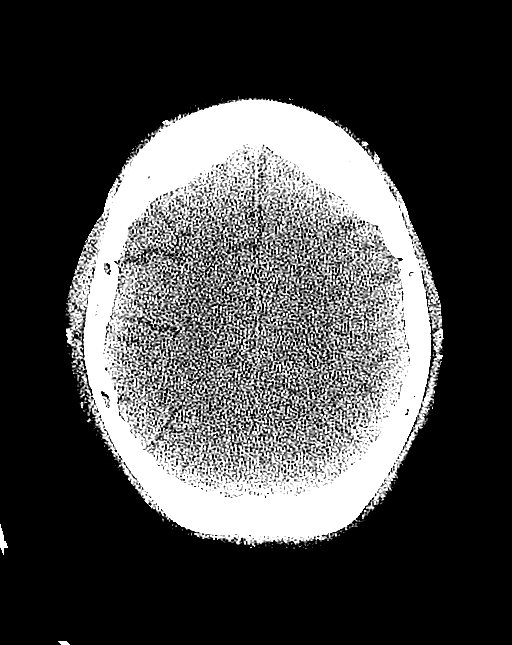
[im 72/89  brain]
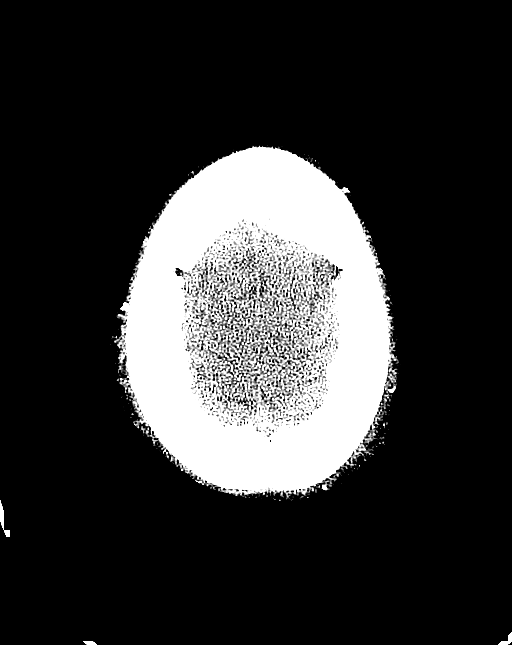
[im 83/89  brain]
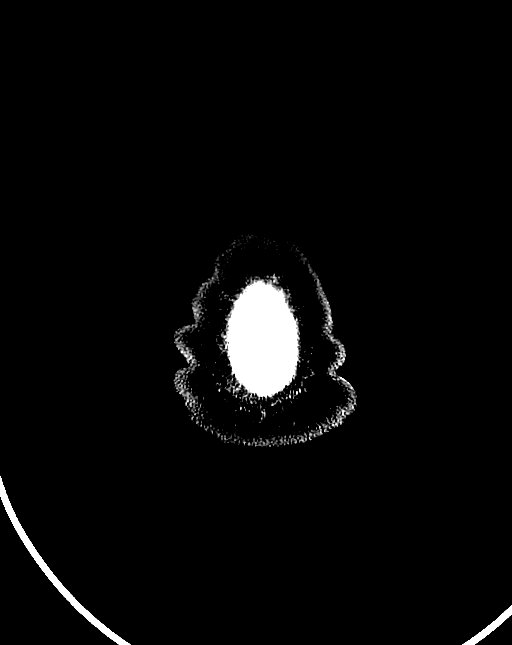
[im 83/89  bone]
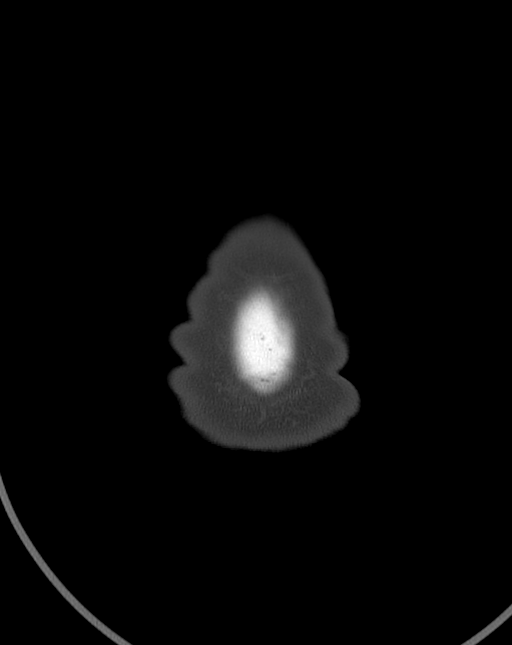

[12 of 47 positions shown; findings below may reference images not displayed]

FINDINGS: CT HEAD FINDINGS

Brain:

Mild generalized cerebral atrophy.

Redemonstrated chronic lacunar infarcts within the left basal
ganglia, anterior limb of left internal capsule and left pons.

Background mild patchy and ill-defined hypoattenuation within the
cerebral white matter, nonspecific but compatible chronic small
vessel ischemic disease.

There is a 13 mm infarct within the ventral and central pons which
is new as compared to the head CT of [DATE]. Background chronic
small vessel ischemic changes are also present within the pons.

There is no acute intracranial hemorrhage.

No demarcated cortical infarct.

No extra-axial fluid collection.

No evidence of an intracranial mass.

No midline shift.

Vascular: No hyperdense vessel.  Atherosclerotic calcifications.

Skull: Normal. Negative for fracture or focal lesion.

CT MAXILLOFACIAL FINDINGS

Osseous: No acute maxillofacial fracture is identified.

Orbits: No acute or significant orbital finding.

Sinuses: Small mucous retention cyst within the right maxillary
sinus.

Soft tissues: Left periorbital soft tissue swelling is questioned.
IMPRESSION: CT head:

1. 13 mm infarct within the ventral and central pons. This appears
new from the prior head CT of [DATE] and may be acute or
subacute. A brain MRI is recommended for further evaluation.
2. No acute post-traumatic intracranial findings.
3. Redemonstrated chronic lacunar infarcts within the left basal
ganglia/internal capsule and left pons. Background cerebral white
matter and pontine chronic small vessel ischemic disease.
4. Mild generalized cerebral atrophy.

CT maxillofacial:

1. No evidence of acute maxillofacial fracture.
2. Left periorbital soft tissue swelling is questioned.
3. Small mucous retention cyst within the right maxillary sinus.

## 2021-10-06 IMAGING — CT CT MAXILLOFACIAL W/O CM
3 series · 15 of 47 positions shown, 18 images · non-contrast
Comparison: Head CT [DATE].

CLINICAL DATA: Head trauma, intracranial venous injury suspected.
Facial trauma, blunt.

EXAM:
CT HEAD WITHOUT CONTRAST
CT MAXILLOFACIAL WITHOUT CONTRAST
TECHNIQUE: Multidetector CT imaging of the head and maxillofacial structures
were performed using the standard protocol without intravenous
contrast. Multiplanar CT image reconstructions of the maxillofacial
structures were also generated.
RADIATION DOSE REDUCTION: This exam was performed according to the
departmental dose-optimization program which includes automated
exposure control, adjustment of the mA and/or kV according to
patient size and/or use of iterative reconstruction technique.

[Series 3: facial/ orbits 2.0 h30s · axial · 0.43mm/px · z∈[-151,+3]mm · 9 of 91 slices shown, 12 images]
[im 7/91  brain]
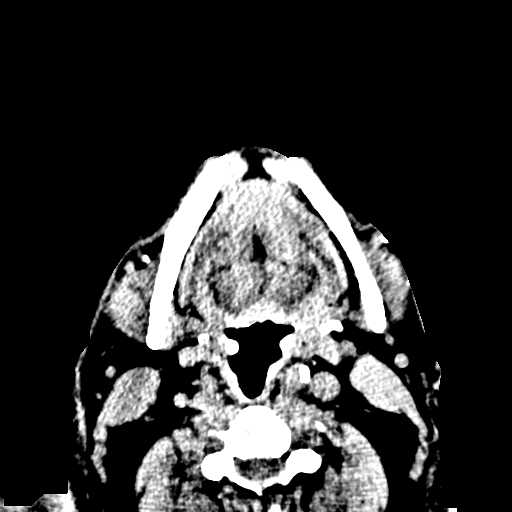
[im 7/91  bone]
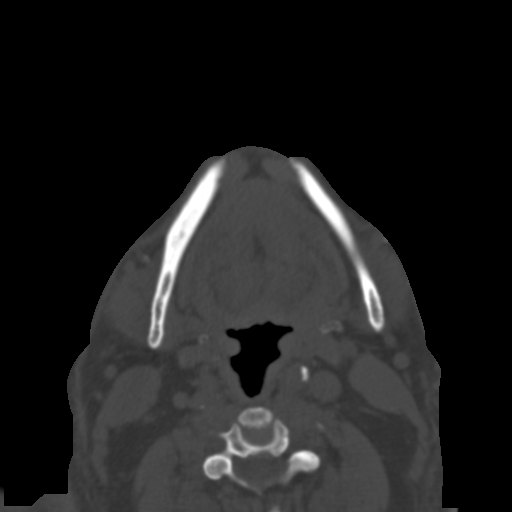
[im 16/91  bone]
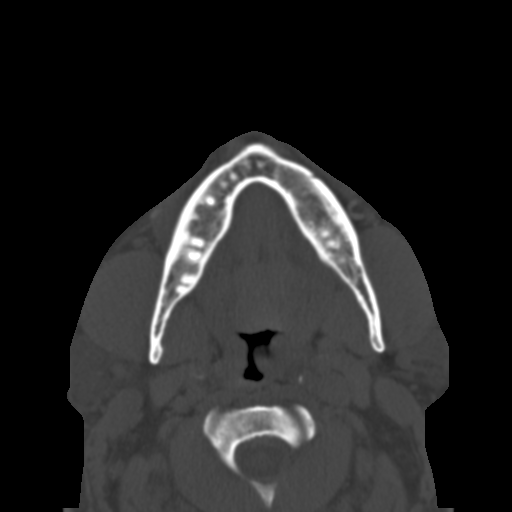
[im 25/91  bone]
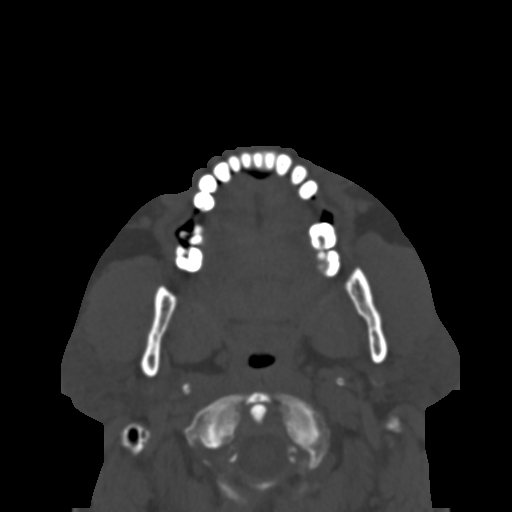
[im 35/91  bone]
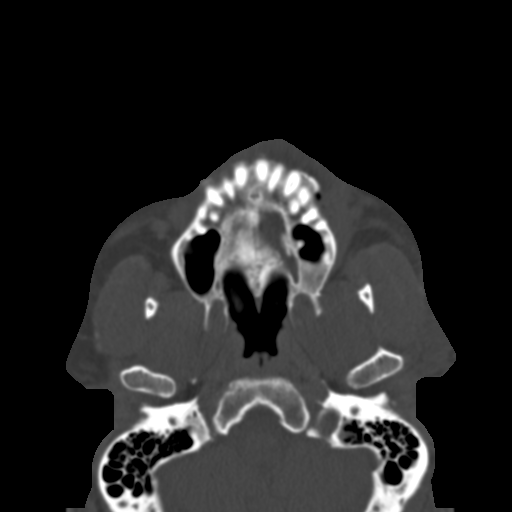
[im 47/91  brain]
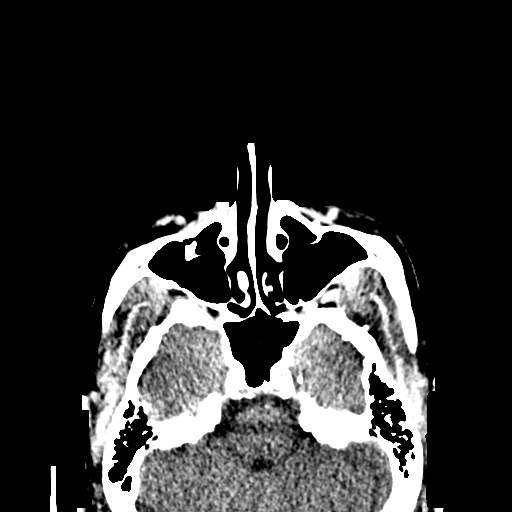
[im 47/91  bone]
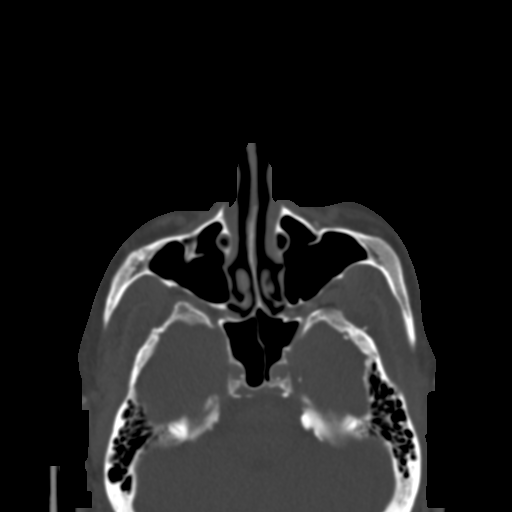
[im 56/91  bone]
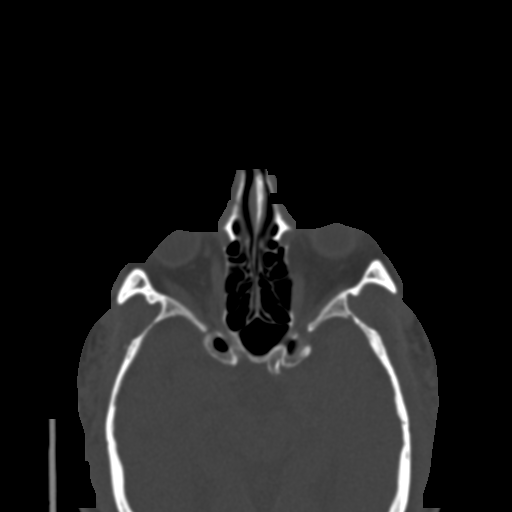
[im 66/91  bone]
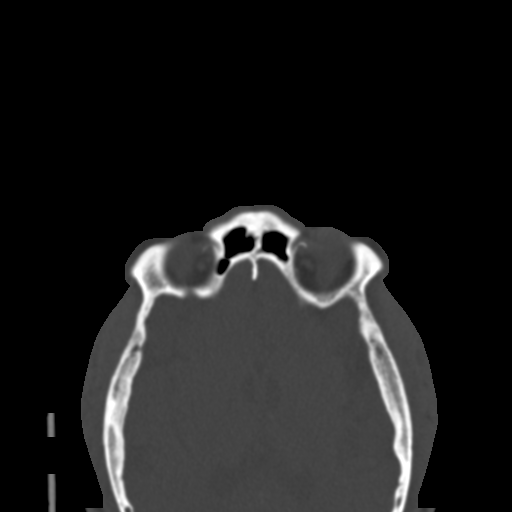
[im 75/91  bone]
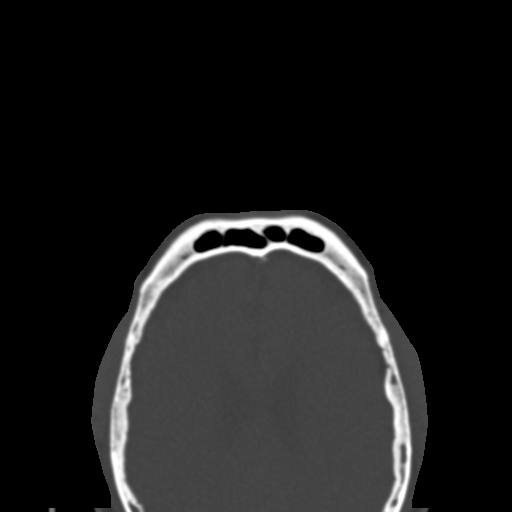
[im 84/91  brain]
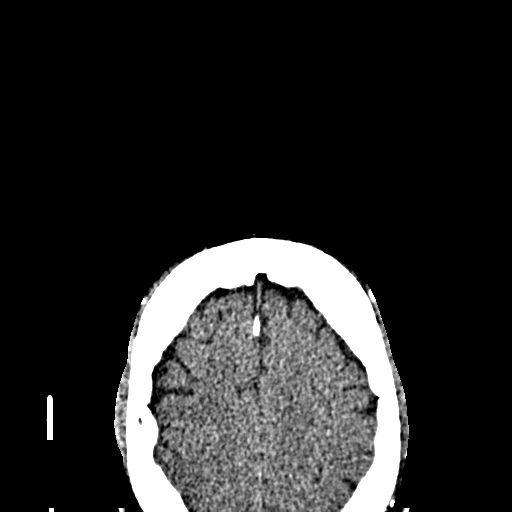
[im 84/91  bone]
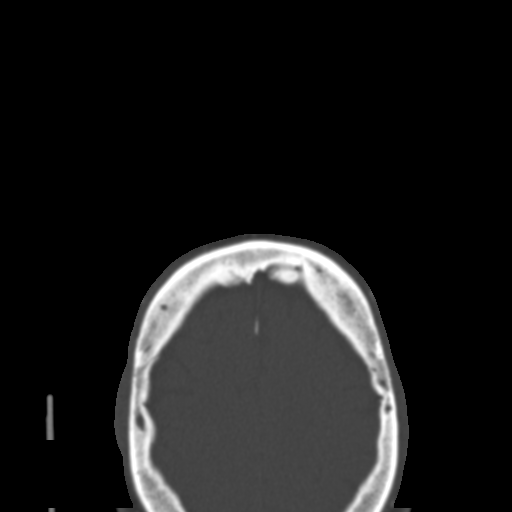

[Series 7: coronal soft tissue · coronal · 0.42mm/px · 3 of 80 slices shown]
[im 27/80  bone]
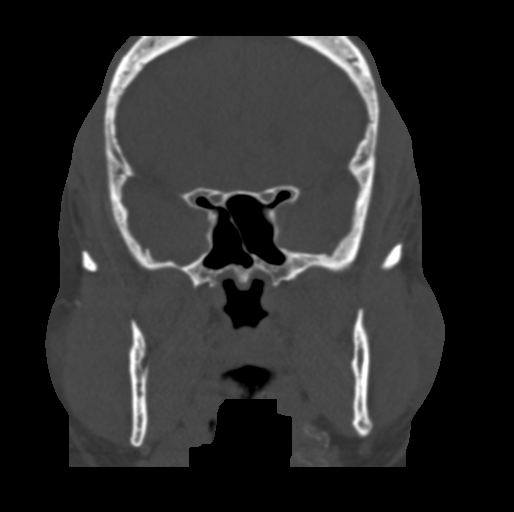
[im 36/80  bone]
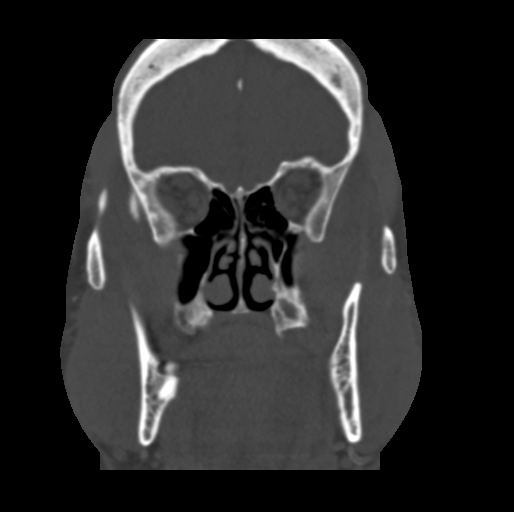
[im 44/80  bone]
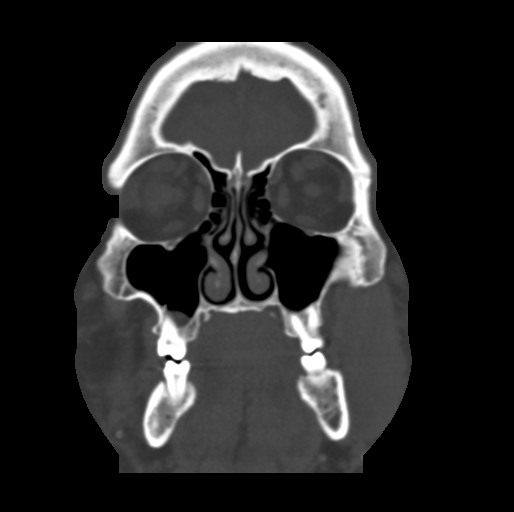

[Series 8: sagittal soft tissue · sagittal · 0.39mm/px · 3 of 96 slices shown]
[im 32/96  bone]
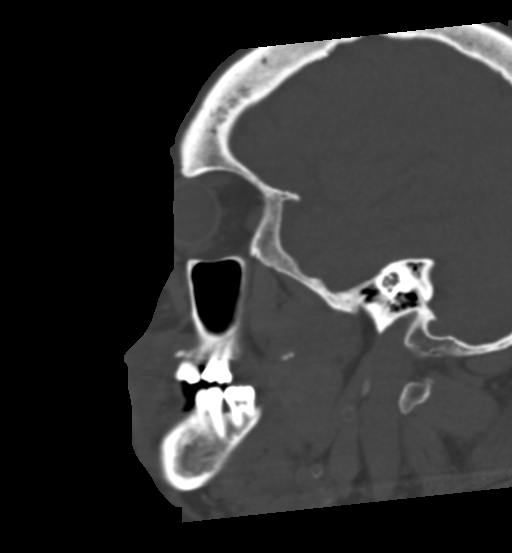
[im 48/96  bone]
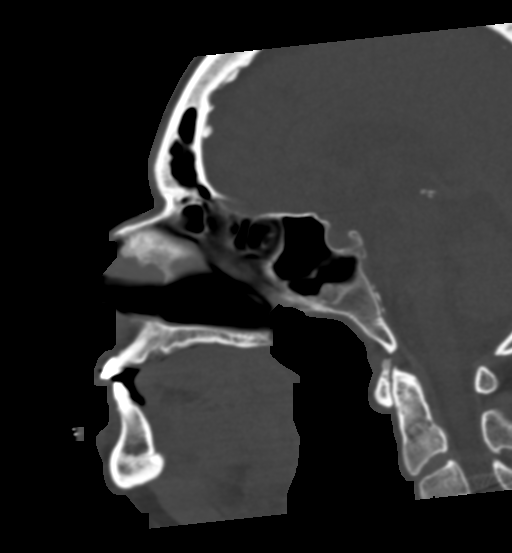
[im 64/96  bone]
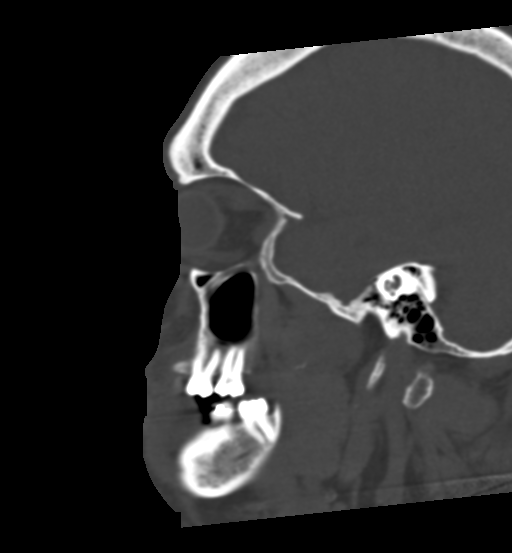

[15 of 47 positions shown; findings below may reference images not displayed]

FINDINGS: CT HEAD FINDINGS

Brain:

Mild generalized cerebral atrophy.

Redemonstrated chronic lacunar infarcts within the left basal
ganglia, anterior limb of left internal capsule and left pons.

Background mild patchy and ill-defined hypoattenuation within the
cerebral white matter, nonspecific but compatible chronic small
vessel ischemic disease.

There is a 13 mm infarct within the ventral and central pons which
is new as compared to the head CT of [DATE]. Background chronic
small vessel ischemic changes are also present within the pons.

There is no acute intracranial hemorrhage.

No demarcated cortical infarct.

No extra-axial fluid collection.

No evidence of an intracranial mass.

No midline shift.

Vascular: No hyperdense vessel.  Atherosclerotic calcifications.

Skull: Normal. Negative for fracture or focal lesion.

CT MAXILLOFACIAL FINDINGS

Osseous: No acute maxillofacial fracture is identified.

Orbits: No acute or significant orbital finding.

Sinuses: Small mucous retention cyst within the right maxillary
sinus.

Soft tissues: Left periorbital soft tissue swelling is questioned.
IMPRESSION: CT head:

1. 13 mm infarct within the ventral and central pons. This appears
new from the prior head CT of [DATE] and may be acute or
subacute. A brain MRI is recommended for further evaluation.
2. No acute post-traumatic intracranial findings.
3. Redemonstrated chronic lacunar infarcts within the left basal
ganglia/internal capsule and left pons. Background cerebral white
matter and pontine chronic small vessel ischemic disease.
4. Mild generalized cerebral atrophy.

CT maxillofacial:

1. No evidence of acute maxillofacial fracture.
2. Left periorbital soft tissue swelling is questioned.
3. Small mucous retention cyst within the right maxillary sinus.

## 2021-10-06 IMAGING — CR DG CHEST 1V
1 series · 1 of 1 positions shown · non-contrast
Comparison: [DATE]

CLINICAL DATA: Dialysis patient.  Right foot wound

EXAM:
CHEST  1 VIEW

[chest ap]
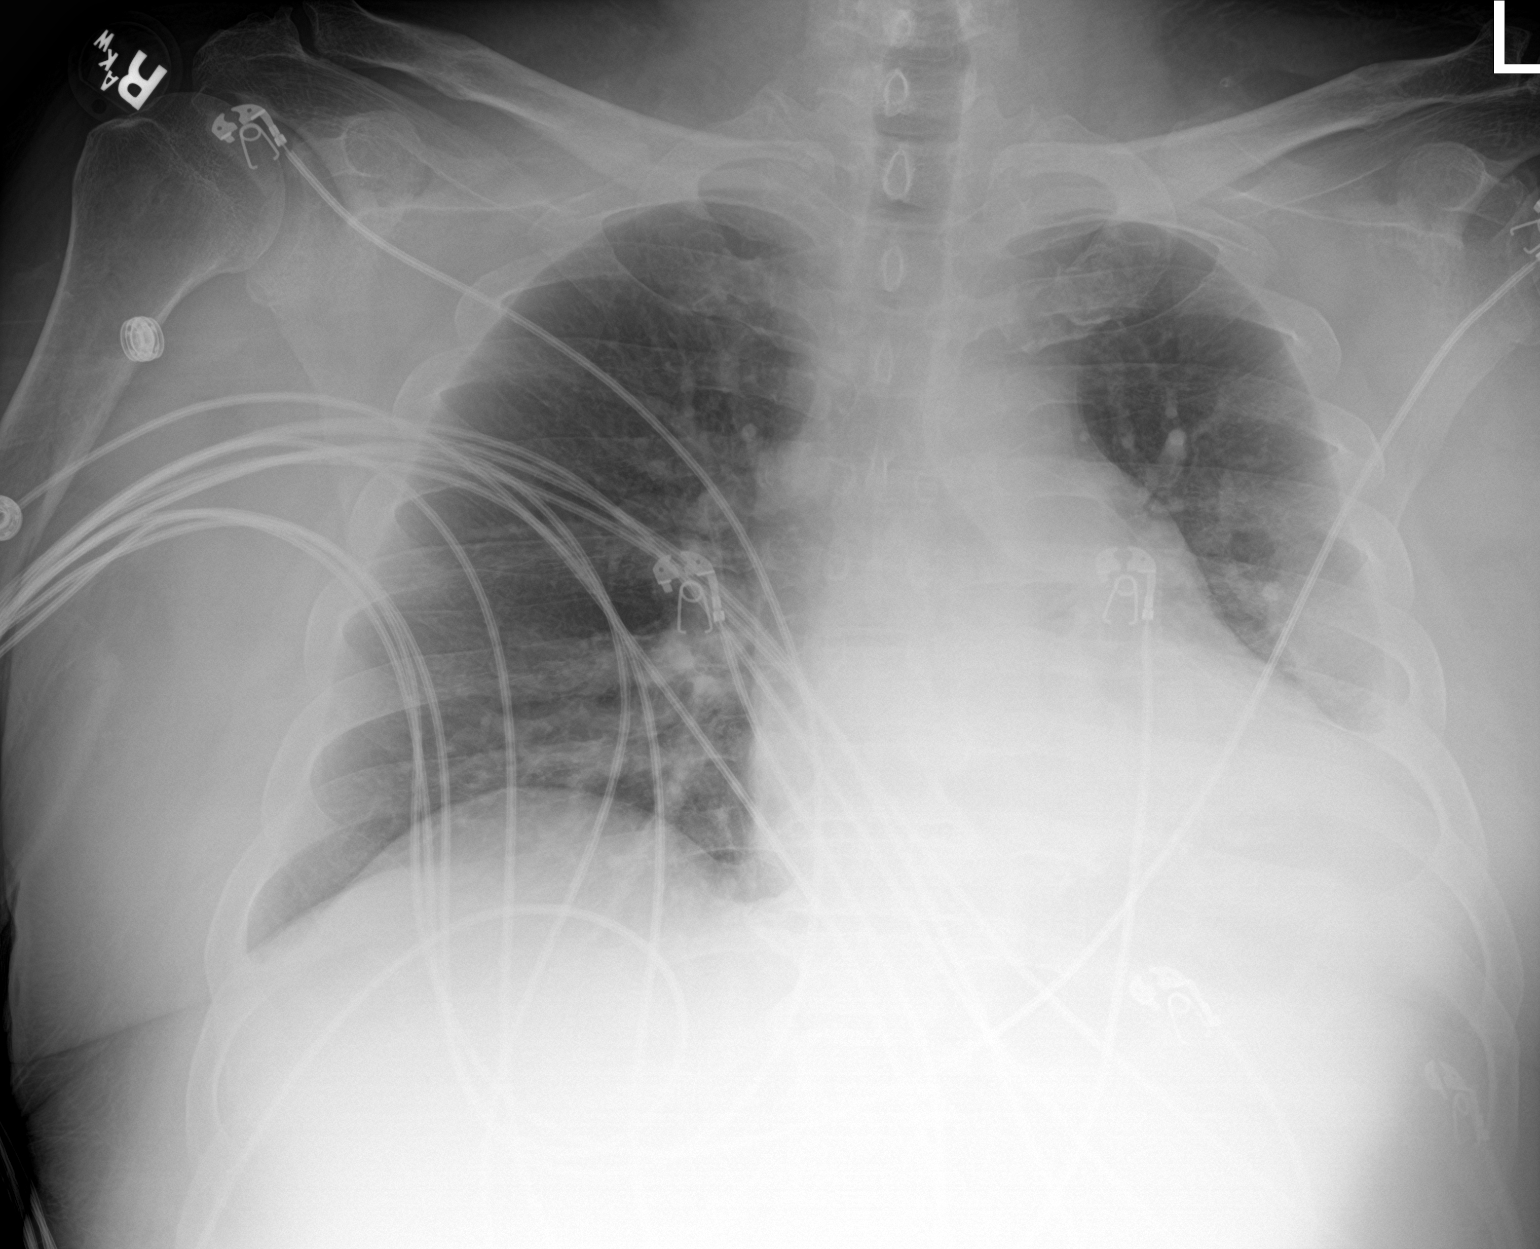

[1 of 1 positions shown; findings below may reference images not displayed]

FINDINGS: Artifact overlies the chest. The right hemithorax is clear. There is
cardiomegaly. There is increased density in the left lower chest
that could be due to a combination of pleural fluid, volume loss and
pneumonia. Similar appearance to the study [K9].
IMPRESSION: Abnormal density in the left lower chest that could be due to a
combination of pleural fluid, volume loss and pneumonia.

## 2021-10-06 MED ORDER — OXYCODONE HCL 5 MG PO TABS
10.0000 mg | ORAL_TABLET | Freq: Once | ORAL | Status: AC
  Administered 2021-10-06: 10 mg via ORAL
  Filled 2021-10-06: qty 2

## 2021-10-06 MED ORDER — ASCORBIC ACID 500 MG PO TABS
500.0000 mg | ORAL_TABLET | Freq: Every day | ORAL | Status: DC
  Administered 2021-10-06 – 2021-10-09 (×3): 500 mg via ORAL
  Filled 2021-10-06 (×3): qty 1

## 2021-10-06 MED ORDER — RAMELTEON 8 MG PO TABS
8.0000 mg | ORAL_TABLET | Freq: Every day | ORAL | Status: DC
  Administered 2021-10-07 – 2021-10-16 (×9): 8 mg via ORAL
  Filled 2021-10-06 (×13): qty 1

## 2021-10-06 MED ORDER — ACETAMINOPHEN 500 MG PO TABS
1000.0000 mg | ORAL_TABLET | Freq: Four times a day (QID) | ORAL | Status: DC | PRN
  Administered 2021-10-06 – 2021-10-09 (×6): 1000 mg via ORAL
  Filled 2021-10-06 (×7): qty 2

## 2021-10-06 MED ORDER — TAMSULOSIN HCL 0.4 MG PO CAPS
0.4000 mg | ORAL_CAPSULE | Freq: Every day | ORAL | Status: DC
  Administered 2021-10-06 – 2021-10-15 (×10): 0.4 mg via ORAL
  Filled 2021-10-06 (×10): qty 1

## 2021-10-06 MED ORDER — CEFEPIME HCL 1 G IJ SOLR
1.0000 g | INTRAMUSCULAR | Status: DC
Start: 2021-10-07 — End: 2021-10-09
  Administered 2021-10-07 – 2021-10-08 (×2): 1 g via INTRAVENOUS
  Filled 2021-10-06 (×3): qty 1

## 2021-10-06 MED ORDER — ASPIRIN EC 81 MG PO TBEC
81.0000 mg | DELAYED_RELEASE_TABLET | Freq: Every day | ORAL | Status: DC
  Administered 2021-10-07 – 2021-10-16 (×9): 81 mg via ORAL
  Filled 2021-10-06 (×9): qty 1

## 2021-10-06 MED ORDER — ONDANSETRON HCL 4 MG PO TABS
4.0000 mg | ORAL_TABLET | Freq: Every day | ORAL | Status: DC | PRN
  Filled 2021-10-06: qty 1

## 2021-10-06 MED ORDER — AMLODIPINE BESYLATE 5 MG PO TABS
5.0000 mg | ORAL_TABLET | Freq: Every day | ORAL | Status: DC
  Administered 2021-10-06: 5 mg via ORAL
  Filled 2021-10-06: qty 1

## 2021-10-06 MED ORDER — HYDROCERIN EX CREA
TOPICAL_CREAM | Freq: Every day | CUTANEOUS | Status: AC
Start: 2021-10-06 — End: 2021-10-08
  Filled 2021-10-06: qty 113

## 2021-10-06 MED ORDER — FERRIC CITRATE 1 GM 210 MG(FE) PO TABS
630.0000 mg | ORAL_TABLET | Freq: Three times a day (TID) | ORAL | Status: DC
  Administered 2021-10-07 – 2021-10-16 (×22): 630 mg via ORAL
  Filled 2021-10-06 (×27): qty 3

## 2021-10-06 MED ORDER — ALBUTEROL SULFATE (2.5 MG/3ML) 0.083% IN NEBU: 3.0000 mL | INHALATION_SOLUTION | Freq: Four times a day (QID) | RESPIRATORY_TRACT | Status: DC | PRN

## 2021-10-06 MED ORDER — ACETAMINOPHEN 500 MG PO TABS
1000.0000 mg | ORAL_TABLET | Freq: Once | ORAL | Status: AC
  Administered 2021-10-06: 1000 mg via ORAL
  Filled 2021-10-06: qty 2

## 2021-10-06 MED ORDER — MELATONIN 3 MG PO TABS
9.0000 mg | ORAL_TABLET | Freq: Every day | ORAL | Status: DC
  Administered 2021-10-06: 9 mg via ORAL
  Filled 2021-10-06: qty 3

## 2021-10-06 MED ORDER — BUPROPION HCL ER (XL) 150 MG PO TB24: 300.0000 mg | ORAL_TABLET | Freq: Every day | ORAL | Status: DC

## 2021-10-06 MED ORDER — ATORVASTATIN CALCIUM 40 MG PO TABS
40.0000 mg | ORAL_TABLET | Freq: Every day | ORAL | Status: DC
  Administered 2021-10-06 – 2021-10-15 (×10): 40 mg via ORAL
  Filled 2021-10-06 (×10): qty 1

## 2021-10-06 MED ORDER — OXYCODONE HCL 5 MG PO TABS
5.0000 mg | ORAL_TABLET | Freq: Four times a day (QID) | ORAL | Status: DC | PRN
  Administered 2021-10-06 – 2021-10-09 (×7): 5 mg via ORAL
  Filled 2021-10-06 (×8): qty 1

## 2021-10-06 MED ORDER — SODIUM CHLORIDE 0.9 % IV SOLN
2.0000 g | Freq: Once | INTRAVENOUS | Status: AC
  Administered 2021-10-06: 2 g via INTRAVENOUS
  Filled 2021-10-06: qty 2

## 2021-10-06 MED ORDER — PANTOPRAZOLE SODIUM 40 MG PO TBEC
40.0000 mg | DELAYED_RELEASE_TABLET | Freq: Every day | ORAL | Status: DC
  Administered 2021-10-06 – 2021-10-15 (×10): 40 mg via ORAL
  Filled 2021-10-06 (×10): qty 1

## 2021-10-06 MED ORDER — VANCOMYCIN HCL 2000 MG/400ML IV SOLN
2000.0000 mg | Freq: Once | INTRAVENOUS | Status: AC
  Administered 2021-10-06: 2000 mg via INTRAVENOUS
  Filled 2021-10-06: qty 400

## 2021-10-06 MED ORDER — SUCROFERRIC OXYHYDROXIDE 500 MG PO CHEW
1000.0000 mg | CHEWABLE_TABLET | Freq: Three times a day (TID) | ORAL | Status: DC
Start: 2021-10-06 — End: 2021-10-08
  Administered 2021-10-06 – 2021-10-07 (×4): 1000 mg via ORAL
  Filled 2021-10-06 (×6): qty 2

## 2021-10-06 MED ORDER — MIDODRINE HCL 5 MG PO TABS
10.0000 mg | ORAL_TABLET | ORAL | Status: DC
  Administered 2021-10-08 – 2021-10-13 (×3): 10 mg via ORAL
  Filled 2021-10-06 (×3): qty 2

## 2021-10-06 MED ORDER — IPRATROPIUM-ALBUTEROL 0.5-2.5 (3) MG/3ML IN SOLN: 3.0000 mL | Freq: Four times a day (QID) | RESPIRATORY_TRACT | Status: DC | PRN

## 2021-10-06 NOTE — Progress Notes (Signed)
Pharmacy Antibiotic Note ? ?Michael Riggs is a 51 y.o. male admitted on 10/06/2021 with cellulitis.  Pharmacy has been consulted for cefepime and vancomycin dosing. ? ?Patient with a history of ESRD on HD, HTN, left BKA, HLD, T2DM, anemia of chronic disease. Patient presenting with rt leg wound. ? ?WBC 11.9; LA 1.1; T 97.9 F ? ?Plan: ?Vancomycin 2g given once - subsequent dosing per HD schedule ?Cefepime 1g q24hr ?Trend WBC, Fever, Renal function, & Clinical course ?F/u cultures, clinical course, WBC, fever ?De-escalate when able ?F/u Nephrology plan ? ?Height: 5\' 10"  (177.8 cm) ?Weight: 99.8 kg (220 lb) ?IBW/kg (Calculated) : 73 ? ?Temp (24hrs), Avg:97.9 ?F (36.6 ?C), Min:97.9 ?F (36.6 ?C), Max:97.9 ?F (36.6 ?C) ? ?Recent Labs  ?Lab 09/29/21 ?1819 09/29/21 ?1915 10/06/21 ?1243  ?WBC 7.6  --  11.9*  ?CREATININE 8.66* 8.80* 8.17*  ?LATICACIDVEN  --   --  1.1  ?  ?Estimated Creatinine Clearance: 12.9 mL/min (A) (by C-G formula based on SCr of 8.17 mg/dL (H)).   ? ?Allergies  ?Allergen Reactions  ? Tape Itching  ?  Prefers silk tape over regular tape  ? ? ?Antimicrobials this admission: ?cefepime 3/20 >>  ?vancomycin 3/20 >>  ? ?Microbiology results: ?Pending ? ?Thank you for allowing pharmacy to be a part of this patient?s care. ? ?Lorelei Pont, PharmD, BCPS ?10/06/2021 2:31 PM ?ED Clinical Pharmacist -  [phone] ?  ?

## 2021-10-06 NOTE — H&P (Addendum)
Family Medicine Teaching Service ?Hospital Admission History and Physical ?Service Pager: [phone] ? ?Patient name: Michael Riggs Medical record number: [id number] ?Date of birth: [birth year] Age: 51 y.o. Gender: male ? ?Primary Care Provider: [name], MD ?Consultants: Podiatry, Nephrology ?Code Status: Full  ?Preferred Emergency Contact: Moises [phone] ?Contact Information   ? ? Name Relation Home Work Mobile  ? Avier, Jech Sister   [phone]  ? Willet,Moises Brother [phone]  [phone]  ? Canuto, Kingston Mother [phone]    ? ?  ? ?Chief Complaint: Infected chronic right leg wound ? ?Assessment and Plan: ?Linc Renne is a 51 y.o. male presenting with fall and near syncope and R leg wound. PMH is significant for ESRD on HD, HTN, left BKA, HLD, T2DM, anemia of chronic disease, history of CVA. ? ?Infected chronic R leg wound ?Presents with infectious-appearing R lower extremity wound with failed antibiotic (Clindamycin) therapy.  Wound cultures grew staph aureus and Serratia however unable to confirm. R ankle XR showed possible nondisplaced distal tibial fracture. ED provider consulted orthopedic surgery who advised that patient is actually a podiatry patient.  Podiatry was then consulted.  Patient was started on vancomycin and cefepime.  Pain is well controlled at this time with Oxy IR 10 mg and Tylenol 1000 mg. MR R ankle pending.  Patient does not appear infectious given afebrile and hemodynamically stable.  Lactic acid 1.1.  CBC with leukocytosis of 11.9 and ANC 10.5.  Podiatry to see in the a.m.-largest concern at this time is why patient is not healing appropriately anywhere.  Diabetes is well controlled and no history of PAD. ?-Admitted to [facility], attending Dr. Ardelia Mems, medical telemetry unit ?-Continuous cardiac monitoring x12 hours ?-Neurovascular checks every 4 hours ?-Vital signs per unit with pulse ox checks ?-Podiatry following, appreciate recommendations ?-Continue Vanco  and cefepime ?-Consult to wound care ?-PT/OT eval and treat ?-Follow-up blood culture ?-Tylenol 1000 mg every 6 hours as needed ?-Oxy IR 5 mg every 6 hours as needed ?-CBC with differential in the a.m. ? ?Fall with near syncope ?Presented after fall patient hit his head. CT head showed 13 mm infarct within the ventral and central pons which is new from prior head CT. No acute post-traumatic intracranial findings. Redemonstrated chronic lacunar infarcts within the left basal ganglia/internal capsule and left pons. Background cerebral white matter and pontine chronic small vessel ischemic disease. Mild generalized cerebral atrophy. CT maxillofacial showed no evidence of acute maxillofacial fracture.  Clinically, fall does not sound like stroke or seizure additionally not presenting with any neurological deficits.  May be related to recent infection or balancing difficulty.  Could also be vasovagal in the setting of poor intake. Not concerned for acute abdomen at this time given resolve of vomiting from 2 days ago that no persistent nausea.  Dizziness was short lasting and may need to discuss further with patient for nature of dizziness, potentially vertigo. May consider cardiac but no evidence of fluid overload and hemodynamically stable at this time.  CXR showed abnormal density in the left lower chest that could be due to a combination of pleural fluid, volume loss and pneumonia. ?-MRI brain pending ?-f/u echo ?- f/u ekg  ? ?ESRD on HD MWF ?Nephrology consulted and will see in the morning.  Patient received partial HD today. Patient receives ferric citrate and Velphoro for ESRD status.  Additionally has midodrine for hypotension during HD. ?-Continue ferric citrate, Velphoro, midodrine as taken for dialysis ? ?History of type 2 diabetes ?Patient states he no longer  takes medications for his diabetes. ?-CBG monitoring every shift ? ?HTN ?Blood pressure stable at 120s 130s over 80s.  Home medications include  amlodipine 5 mg daily. ?-Continue amlodipine 5 mg daily ? ?HLD ?Unable to find lipid panel in records.  Home medications include atorvastatin 40 mg daily. ?-Continue atorvastatin 40 mg daily ?-AM lipid panel ? ?Anemia of chronic disease ?Baseline in the past looks to be 10-11. Likely related to ESRD. Currently hemoglobin 13.2. ?-monitor CBC ? ?History of stroke ?Patient endorses history of stroke however unable to correlate head imaging with chart review.  Patient also endorses he was never encouraged to take medications for stroke in the past.  Does not appear to have any focal neurological deficits. MRI brain may be of further information.  Already obtaining lipid panel tomorrow a.m. ? ?FEN/GI: renal with fluid restriction ?Prophylaxis: None ? ?Disposition: Med tele ? ?History of Present Illness:  Michael Riggs is a 51 y.o. male presenting with right leg wound that is nonresponsive to clindamycin and fall with near syncope. ? ?Infected wound over right achilles since December. Been following by wound center. NP took cultures growing staph aureus and serratia. Clinda didn't work.  ? ?On his way to dialysis, dizzy, fell and faceplanted. Dizzy twice today, once when standing coming out of the car and he hit his head on the pavement. The second time was when he was with wound care who advised him to come to the ED to take a look at his foot for additional abx and scraping of wound. He states wound has been there for about 3 months. Endorses lower LE pain that was not improved on Tramadol. He was advised to follow up with pain clinic.  ? ?Denies fevers, does endorse chills Friday or Saturday. Endorses nausea and vomiting over the weekend on Saturday. States he ate breakfast and threw up after. Denies blood in vomit. States since then his appetite has decreased. Denies abdominal pain. Denies chest pain, difficulty breathing. Endorses feeling like something is stuck in his throat which hurts when he  drinks ? ?States he passed out when he got to the hospital. ? ?Ambulates on right leg and also has a prosthetic for left leg. States he has been trying to get a mechanical wheel chair.  ? ?Dialysis on MWF ? ?Has taken medications today. ? ?Denies current tobacco use, quit when he was 29. Denies current alcohol use, last drank over a year ago. Denies recreational drug use.  ? ?Review Of Systems: Per HPI ? ?Patient Active Problem List  ? Diagnosis Date Noted  ? 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 Personal history of COVID-19 09/23/2020  ? COVID-19 09/11/2020  ? Acute respiratory failure with hypoxia ([place]) 11/06/2019  ? CAP (community acquired pneumonia) 10/11/2019  ? Generalized hyperhidrosis 10/11/2019  ? Allergy, unspecified, initial encounter 10/03/2019  ? Diarrhea, unspecified 07/24/2019  ? Lower GI bleed 05/14/2019  ? Pressure injury of skin 04/27/2019  ? Admission for palliative care 04/27/2019  ? Major depressive disorder, single episode, unspecified 04/27/2019  ? Other disorders of calcium metabolism 04/27/2019  ? Palliative care encounter   ? Goals of care, counseling/discussion   ? Calciphylaxis   ? Wound infection 03/28/2019  ? Hypertension   ? Dyslipidemia   ? Diabetes mellitus with peripheral vascular disease ([place])   ? Depression   ? Physical deconditioning   ? Coagulation defect, unspecified ([place]) 03/15/2019  ? Other malaise 03/14/2019  ? Other specified disorders of the male genital organs 03/14/2019  ?  Other specified symptoms and signs involving the circulatory and respiratory systems 03/14/2019  ? Unspecified protein-calorie malnutrition ([place]) 03/14/2019  ? Pruritus   ? Scrotal pain   ? Labile blood pressure   ? Scrotal edema   ? Status post below-knee amputation of left lower extremity ([place])   ? Slow transit constipation   ? Leukocytosis   ? Sleep disturbance   ? Essential hypertension   ? Anemia of chronic disease   ? Controlled type 2 diabetes mellitus with hyperglycemia, without long-term  current use of insulin ([place])   ? ESRD on dialysis [facility])   ? Urinary retention   ? Debility 02/09/2019  ? Fluid overload 02/04/2019  ? Dyspnea, unspecified 02/03/2019  ? Gastro-esophageal reflux disease without esophagitis 07/1

## 2021-10-06 NOTE — ED Notes (Signed)
Unable to obtain second set of blood cultures.  

## 2021-10-06 NOTE — ED Provider Notes (Signed)
?[facility] ?Provider Note ? ? ?CSN: [id number] ?Arrival date & time: 10/06/21  1155 ? ?  ? ?History ? ?Chief Complaint  ?Patient presents with  ? Fall  ? ? ?Michael Riggs is a 51 y.o. male. ? ?HPI ?50 year old male with a history of ESRD, diabetes, hypertension, and previous left BKA presents with fall and near syncope.  He is also complaining of a chronic but worsening right leg wound.  He states that today he was getting ready for dialysis and went to get breakfast.  After eating, which he only had a little bit, he stood up and felt lightheaded.  Had to rest for a couple minutes and then felt better.  When he got to the the wound care center they change his wound dressing but stated that it was at the point that he needed to see an orthopedist here at [facility] for a "scraping".  He has been dealing with this wound since December.  He has been getting wound care multiple times a week.  Recently finished a course of antibiotics that did not do anything.  Has had chronic drainage.  No fevers.  When he went to dialysis he was lightheaded again after getting out of the car and had the try and hold onto the car but when he put his hand down he missed the car and ended up falling hitting his face.  Has some pain in his mouth with a small cut but denies any loose teeth.  Did not lose consciousness but has some facial pain.  Scraped his right knee but that does not hurt too bad.  Never had any chest pain, headache, shortness of breath. Last Tdap was within 2 years or so. ? ?Home Medications ?Prior to Admission medications   ?Medication Sig Start Date End Date Taking? Authorizing Provider  ?acetaminophen (TYLENOL) 500 MG tablet Take 1,000 mg by mouth every 6 (six) hours as needed for mild pain.   Yes [provider]  ?albuterol (VENTOLIN HFA) 108 (90 Base) MCG/ACT inhaler Inhale 2 puffs into the lungs every 6 (six) hours as needed for wheezing or shortness of breath.    Yes [provider]  ?amLODipine (NORVASC) 5 MG tablet Take 5 mg by mouth at bedtime.    Yes [provider]  ?atorvastatin (LIPITOR) 40 MG tablet Take 1 tablet (40 mg total) by mouth daily. ?Patient taking differently: Take 40 mg by mouth at bedtime. 03/13/19  Yes Angiulli, Lavon Paganini, PA-C  ?augmented betamethasone dipropionate (DIPROLENE-AF) 0.05 % cream Apply 1 application. topically daily. 09/18/21  Yes [provider]  ?buPROPion (WELLBUTRIN XL) 300 MG 24 hr tablet Take 300 mg by mouth at bedtime.  09/27/19  Yes [provider]  ?clindamycin (CLEOCIN) 150 MG capsule Take 450 mg by mouth 3 (three) times daily. 5 day course. On day 4 10/02/21  Yes [provider]  ?diphenhydrAMINE (BENADRYL) 25 MG tablet Take 50 mg by mouth 2 (two) times daily as needed for itching or sleep.   Yes [provider]  ?diphenhydramine-acetaminophen (TYLENOL PM) 25-500 MG TABS tablet Take 1 tablet by mouth See admin instructions. 2 tablets the morning of dialysis and 2 tablets 1 hour before dialysis   Yes [provider]  ?ferric citrate (AURYXIA) 1 GM 210 MG(Fe) tablet Take 630 mg by mouth 3 (three) times daily with meals. 03/12/21  Yes [provider]  ?ipratropium-albuterol (DUONEB) 0.5-2.5 (3) MG/3ML SOLN Take 3 mLs by nebulization every  6 (six) hours as needed (For shortness of breath).   Yes [provider]  ?Melatonin 10 MG TABS Take 10 mg by mouth at bedtime.   Yes [provider]  ?midodrine (PROAMATINE) 10 MG tablet Take 10 mg by mouth See admin instructions. Takes 10mg  on days with dialysis 09/17/20  Yes [provider]  ?mupirocin ointment (BACTROBAN) 2 % Apply to right lower extremity once daily. 05/22/21  Yes Marzetta Board, DPM  ?ondansetron (ZOFRAN) 4 MG tablet Take 4 mg by mouth daily as needed for vomiting. 01/01/21  Yes [provider]  ?oxycodone (OXY-IR) 5 MG capsule Take 1 capsule (5 mg total) by mouth every 6  (six) hours as needed. 07/06/19  Yes Dagoberto Ligas, PA-C  ?pantoprazole (PROTONIX) 40 MG tablet Take 1 tablet (40 mg total) by mouth daily. ?Patient taking differently: Take 40 mg by mouth at bedtime. 03/13/19  Yes Angiulli, Lavon Paganini, PA-C  ?Pseudoeph-Doxylamine-DM-APAP (NYQUIL PO) Take 15 mLs by mouth 2 (two) times daily as needed (cough/fever).   Yes [provider]  ?sucroferric oxyhydroxide (VELPHORO) 500 MG chewable tablet Chew 1,000 mg by mouth 3 (three) times daily with meals.   Yes [provider]  ?tamsulosin (FLOMAX) 0.4 MG CAPS capsule Take 1 capsule (0.4 mg total) by mouth daily after breakfast. ?Patient taking differently: Take 0.4 mg by mouth at bedtime. 03/13/19  Yes Angiulli, Lavon Paganini, PA-C  ?traMADol (ULTRAM) 50 MG tablet Take 1 tablet (50 mg total) by mouth every 8 (eight) hours as needed for moderate pain. ?Patient taking differently: Take 50 mg by mouth every 6 (six) hours as needed for moderate pain. 05/18/19  Yes Mercy Riding, MD  ?vitamin C (ASCORBIC ACID) 500 MG tablet Take 500 mg by mouth daily.   Yes [provider]  ?bromocriptine (PARLODEL) 2.5 MG tablet Take 1 tablet (2.5 mg total) by mouth at bedtime. ?Patient not taking: Reported on 10/06/2021 12/07/20   Renato Shin, MD  ?diclofenac Sodium (VOLTAREN) 1 % GEL Apply 2 g topically 4 (four) times daily. ?Patient not taking: Reported on 10/06/2021 10/12/19   Geradine Girt, DO  ?DULoxetine (CYMBALTA) 20 MG capsule Take 20 mg by mouth 2 (two) times daily. ?Patient not taking: Reported on 10/06/2021 03/18/21   [provider]  ?Hydrocortisone (GERHARDT'S BUTT CREAM) CREA Apply 1 application topically 3 (three) times daily as needed for irritation. ?Patient not taking: Reported on 10/06/2021 04/27/19   Dessa Phi, DO  ?sildenafil (VIAGRA) 100 MG tablet Take 1 tablet (100 mg total) by mouth daily as needed for erectile dysfunction. ?Patient not taking: Reported on 10/06/2021 09/02/21   Renato Shin, MD  ?    ? ?Allergies    ?Tape   ? ?Review of Systems   ?Review of Systems  ?Constitutional:  Negative for fever.  ?HENT:  Positive for facial swelling.   ?Respiratory:  Negative for shortness of breath.   ?Cardiovascular:  Negative for chest pain.  ?Skin:  Positive for wound.  ?Neurological:  Negative for weakness, numbness and headaches.  ? ?Physical Exam ?Updated Vital Signs ?BP 135/86 (BP Location: Right Arm)   Pulse 84   Temp 97.9 ?F (36.6 ?C) (Oral)   Resp 15   Ht 5\' 10"  (1.778 m)   Wt 99.8 kg   SpO2 100%   BMI 31.57 kg/m?  ?Physical Exam ?Vitals and nursing note reviewed.  ?Constitutional:   ?   Appearance: He is well-developed. He is obese.  ?HENT:  ?   Head:  Normocephalic. Abrasion present.  ? ?   Mouth/Throat:  ? ?Eyes:  ?   Extraocular Movements: Extraocular movements intact.  ?   Pupils: Pupils are equal, round, and reactive to light.  ?Cardiovascular:  ?   Rate and Rhythm: Normal rate and regular rhythm.  ?   Pulses:     ?     Dorsalis pedis pulses are 2+ on the right side.  ?   Heart sounds: Normal heart sounds.  ?Pulmonary:  ?   Effort: Pulmonary effort is normal.  ?   Breath sounds: Normal breath sounds.  ?Abdominal:  ?   General: There is no distension.  ?   Palpations: Abdomen is soft.  ?   Tenderness: There is no abdominal tenderness.  ?Musculoskeletal:  ?   Comments: See picture. No tenderness but mild warmth and redness to RLE. Chronic appearing wound over achilles.  ?Small abrasion over right knee, no tenderness  ?Skin: ?   General: Skin is warm and dry.  ?Neurological:  ?   Mental Status: He is alert.  ?   Comments: CN 3-12 grossly intact. 5/5 strength in all 4 extremities. Grossly normal sensation. Normal finger to nose.   ? ? ?ED Results / Procedures / Treatments   ?Labs ?(all labs ordered are listed, but only abnormal results are displayed) ?Labs Reviewed  ?COMPREHENSIVE METABOLIC PANEL - Abnormal; Notable for the following components:  ?    Result Value  ? Sodium 133 (*)   ? Chloride 91 (*)    ? Glucose, Bld 118 (*)   ? BUN 45 (*)   ? Creatinine, Ser 8.17 (*)   ? Albumin 3.2 (*)   ? Total Bilirubin 1.5 (*)   ? GFR, Estimated 7 (*)   ? All other components within normal limits  ?CBC WITH DIFFEREN

## 2021-10-06 NOTE — ED Notes (Signed)
Pt transported to Xray. 

## 2021-10-06 NOTE — Progress Notes (Addendum)
FPTS Brief Progress Note ? ?S: Patient seen and assessed at bedside.  RN was at bedside as well.  Patient denies any acute complaints at this time but did request Benadryl for sleep, Zofran for nausea, and CPAP machine for OSA.  Agreed to zofran and CPAP but gave ramelteon for sleep instead as benadryl not really indicated for sleep and patient already has scheduled melatonin. Patient also mentions that evening prior to hospitalization, he had an episode of diplopia that improved by morning. ? ?Discussed with Dr. Cheral Marker regarding brain MRI findings and head CT.  Dr. Cheral Marker suspect that brain MRI punctate foci are more likely to be motion artifact than actual stroke findings.  Infarct seen on CT correlates with old stroke on brain MRI.  Dr. Cheral Marker did recommend starting patient on baby aspirin and maximizing statin given history of strokes as well as old stroke findings. Will communicate this with day team. ? ? ?O: ?BP 120/83 (BP Location: Right Arm)   Pulse 88   Temp 98.2 ?F (36.8 ?C) (Oral)   Resp 18   Ht 5\' 10"  (1.778 m)   Wt 99.8 kg   SpO2 100%   BMI 31.57 kg/m?   ? ?Cranial nerves II through XII intact. FTN dymetria more prominent in left than right. ? ?A/P: ?- Added baby aspirin starting tomorrow morning ?- Resumed zofran prn, Qtc 420 on cardiac monitoring ?- Consider maximizing statin ?- CPAP nightly ordered ?- Ramelteon nightly ?- Remaining plans per day team ?- Orders reviewed. Labs for AM ordered, which was adjusted as needed.  ?- If condition changes, plan includes bedside assessment.  ? ?France Ravens, MD ?10/06/2021, 9:14 PM ?PGY-1, [place] Medicine Night Resident  ?Please page [phone] with questions.  ? ?

## 2021-10-06 NOTE — Consult Note (Signed)
Podiatry consult ?Attempted to see patient to complete consult.  Patient was off the floor for MRI.  I will follow-up with patient again tomorrow to complete consult as called by Dr. Regenia Skeeter for right heel/Achilles wound. ? ?Dr. Landis Martins ?On Call provider  ?[facility] ?[phone] office ?[phone] cell ?Available via secure chat   ?

## 2021-10-06 NOTE — Consult Note (Signed)
[place] [facility] Nurse Consult Note: ?[facility] Nurse is simultaneously consulted with Podiatric Medicine. Dr. Wallie Char has been in to see patient but he was in MRI at the time of her visit. She will return to see tomorrow.  ?In the interim, I have provided Nursing with conservative care guidance for topical care using soap and water to cleanse RLE followed by a rinse and pat dry. An emollient, Eucerin cream, is to be applied to the LE, but not over the wound at the Achille's area or between digits.  ?Topical care to the wound will be with a folded layer of antimicrobial nonadherent (xeroform) gauze topped with dry gauze, an ABD pad and secured with a few turns of Kerlix roll gauze/paper tape. This is to be changed daily for 2 days. ?Any orders from Dr. Cannon Kettle will supercede these as we defer to her expertise. ? ?Thank you. ? ?[facility] nursing team will not follow, but will remain available to this patient, the nursing and medical teams.  Please re-consult if needed. ?Thanks, ?Maudie Flakes, MSN, RN, [place], [place], CWON-AP, [place]  ?Pager# [phone]  ? ? ?  ?

## 2021-10-06 NOTE — ED Notes (Signed)
Transported to MRI

## 2021-10-06 NOTE — Progress Notes (Signed)
RT set up CPAP at bedside. Pt stated he will place himself on when ready without assistance. RT will monitor as needed. ?

## 2021-10-06 NOTE — ED Notes (Signed)
Out to MRI 

## 2021-10-06 NOTE — ED Triage Notes (Signed)
Pt bib  EMS from dialysis, pt states that he got dizzy and fell on the way into dialysis treatment. He received one hour of treatment then decided that he needed to come to the hospital. Pt arrives complaining of facial pain and left knee pain. Pt has left BKA and multiple wounds to his legs from before. AOx4,VSS ?EMS vitals: 148/88, 80HR, 114CBG ?

## 2021-10-07 ENCOUNTER — Inpatient Hospital Stay (HOSPITAL_COMMUNITY): Payer: Medicare Other

## 2021-10-07 DIAGNOSIS — S91309A Unspecified open wound, unspecified foot, initial encounter: Secondary | ICD-10-CM

## 2021-10-07 DIAGNOSIS — I639 Cerebral infarction, unspecified: Secondary | ICD-10-CM

## 2021-10-07 DIAGNOSIS — R55 Syncope and collapse: Secondary | ICD-10-CM | POA: Diagnosis not present

## 2021-10-07 DIAGNOSIS — I5042 Chronic combined systolic (congestive) and diastolic (congestive) heart failure: Secondary | ICD-10-CM

## 2021-10-07 DIAGNOSIS — L03115 Cellulitis of right lower limb: Secondary | ICD-10-CM | POA: Diagnosis not present

## 2021-10-07 LAB — CBC WITH DIFFERENTIAL/PLATELET
Abs Immature Granulocytes: 0.09 10*3/uL — ABNORMAL HIGH (ref 0.00–0.07)
Basophils Absolute: 0.1 10*3/uL (ref 0.0–0.1)
Basophils Relative: 1 %
Eosinophils Absolute: 0.1 10*3/uL (ref 0.0–0.5)
Eosinophils Relative: 1 %
HCT: 40 % (ref 39.0–52.0)
Hemoglobin: 13.1 g/dL (ref 13.0–17.0)
Immature Granulocytes: 1 %
Lymphocytes Relative: 5 %
Lymphs Abs: 0.6 10*3/uL — ABNORMAL LOW (ref 0.7–4.0)
MCH: 33.2 pg (ref 26.0–34.0)
MCHC: 32.8 g/dL (ref 30.0–36.0)
MCV: 101.3 fL — ABNORMAL HIGH (ref 80.0–100.0)
Monocytes Absolute: 1 10*3/uL (ref 0.1–1.0)
Monocytes Relative: 8 %
Neutro Abs: 9.9 10*3/uL — ABNORMAL HIGH (ref 1.7–7.7)
Neutrophils Relative %: 84 %
Platelets: 197 10*3/uL (ref 150–400)
RBC: 3.95 MIL/uL — ABNORMAL LOW (ref 4.22–5.81)
RDW: 16.2 % — ABNORMAL HIGH (ref 11.5–15.5)
WBC: 11.7 10*3/uL — ABNORMAL HIGH (ref 4.0–10.5)
nRBC: 0 % (ref 0.0–0.2)

## 2021-10-07 LAB — ECHOCARDIOGRAM COMPLETE
AR max vel: 2.59 cm2
AV Peak grad: 5.2 mmHg
Ao pk vel: 1.14 m/s
Area-P 1/2: 3.06 cm2
Calc EF: 50.4 %
Height: 70 in
P 1/2 time: 386 msec
S' Lateral: 4.6 cm
Single Plane A2C EF: 48.3 %
Single Plane A4C EF: 52.8 %
Weight: 3520 oz

## 2021-10-07 LAB — BASIC METABOLIC PANEL
Anion gap: 19 — ABNORMAL HIGH (ref 5–15)
BUN: 58 mg/dL — ABNORMAL HIGH (ref 6–20)
CO2: 24 mmol/L (ref 22–32)
Calcium: 9.5 mg/dL (ref 8.9–10.3)
Chloride: 87 mmol/L — ABNORMAL LOW (ref 98–111)
Creatinine, Ser: 9.62 mg/dL — ABNORMAL HIGH (ref 0.61–1.24)
GFR, Estimated: 6 mL/min — ABNORMAL LOW (ref >60)
Glucose, Bld: 123 mg/dL — ABNORMAL HIGH (ref 70–99)
Potassium: 4.8 mmol/L (ref 3.5–5.1)
Sodium: 130 mmol/L — ABNORMAL LOW (ref 135–145)

## 2021-10-07 LAB — BLOOD CULTURE ID PANEL (REFLEXED) - BCID2

## 2021-10-07 LAB — LIPID PANEL
Cholesterol: 75 mg/dL (ref 0–200)
HDL: 26 mg/dL — ABNORMAL LOW (ref >40)
LDL Cholesterol: 33 mg/dL (ref 0–99)
Total CHOL/HDL Ratio: 2.9 RATIO
Triglycerides: 82 mg/dL (ref <150)
VLDL: 16 mg/dL (ref 0–40)

## 2021-10-07 LAB — HEPATITIS B SURFACE ANTIGEN: Hepatitis B Surface Ag: NONREACTIVE

## 2021-10-07 LAB — GLUCOSE, CAPILLARY: Glucose-Capillary: 134 mg/dL — ABNORMAL HIGH (ref 70–99)

## 2021-10-07 LAB — HIV ANTIBODY (ROUTINE TESTING W REFLEX): HIV Screen 4th Generation wRfx: NONREACTIVE

## 2021-10-07 LAB — HEPATITIS B SURFACE ANTIBODY,QUALITATIVE: Hep B S Ab: NONREACTIVE

## 2021-10-07 MED ORDER — BUPROPION HCL ER (XL) 300 MG PO TB24
300.0000 mg | ORAL_TABLET | Freq: Every day | ORAL | Status: DC
  Administered 2021-10-07 – 2021-10-16 (×8): 300 mg via ORAL
  Filled 2021-10-07 (×5): qty 2
  Filled 2021-10-07: qty 1
  Filled 2021-10-07 (×2): qty 2

## 2021-10-07 MED ORDER — SODIUM CHLORIDE 0.9% FLUSH
3.0000 mL | Freq: Two times a day (BID) | INTRAVENOUS | Status: DC
  Administered 2021-10-07 – 2021-10-08 (×2): 3 mL via INTRAVENOUS

## 2021-10-07 MED ORDER — PERFLUTREN LIPID MICROSPHERE
1.0000 mL | INTRAVENOUS | Status: AC | PRN
  Administered 2021-10-07: 2 mL via INTRAVENOUS
  Filled 2021-10-07: qty 10

## 2021-10-07 MED ORDER — HEPARIN SODIUM (PORCINE) 5000 UNIT/ML IJ SOLN
5000.0000 [IU] | Freq: Three times a day (TID) | INTRAMUSCULAR | Status: DC
  Administered 2021-10-07 – 2021-10-15 (×21): 5000 [IU] via SUBCUTANEOUS
  Filled 2021-10-07 (×24): qty 1

## 2021-10-07 MED ORDER — CALCITRIOL 0.25 MCG PO CAPS
2.0000 ug | ORAL_CAPSULE | ORAL | Status: DC
  Administered 2021-10-08 – 2021-10-13 (×3): 2 ug via ORAL
  Filled 2021-10-07 (×6): qty 4

## 2021-10-07 MED ORDER — METOPROLOL TARTRATE 25 MG PO TABS
25.0000 mg | ORAL_TABLET | Freq: Two times a day (BID) | ORAL | Status: DC
  Administered 2021-10-07 – 2021-10-16 (×15): 25 mg via ORAL
  Filled 2021-10-07 (×15): qty 1

## 2021-10-07 MED ORDER — CHLORHEXIDINE GLUCONATE CLOTH 2 % EX PADS
6.0000 | MEDICATED_PAD | Freq: Every day | CUTANEOUS | Status: DC
  Administered 2021-10-08 – 2021-10-16 (×9): 6 via TOPICAL

## 2021-10-07 NOTE — Consult Note (Signed)
?                    NEURO HOSPITALIST CONSULT NOTE  ? ?Requestig physician: Dr. McIntyre ? ?Reason for Consult: Equivocal acute strokes on MRI ? ?History obtained from:  Patient and Chart    ? ?HPI:                                                                                                                                         ? Michael Riggs is an 51 y.o. male with a PMHx of ESRD on HD, HTN, left BKA, HLD, T2DM, anemia of chronic disease and CVA who was admitted to the hospital on Monday after a fall and near syncope. He has significant ongoing comorbidity of infected right lower extremity wound with failed antibiotic (Clindamycin) therapy.  Wound cultures grew staph aureus and Serratia however unable to confirm. R ankle XR showed possible nondisplaced distal tibial fracture. Podiatry was consulted. The patient was started on vancomycin and cefepime.   ? ?CT in the ED showed a chronic-appearing pontine infarction and chronic lacunar infarcts within the left basal ganglia/internal capsule and left pons, in addition to background cerebral white matter and pontine chronic small vessel ischemic disease on a background of mild generalized cerebral atrophy.  ? ?MRI brain was then obtained, revealing punctate hyperintensities in the ventral pons on DWI which appeared most consistent with artifact, but which also were thought possibly to represent acute lacunar infarctions.  ? ?Past Medical History:  ?Diagnosis Date  ? Anemia   ? Anemia of chronic disease   ? Anxiety   ? ARF (acute renal failure) (HCC) 01/30/2019  ? Calciphylaxis   ? Controlled type 2 diabetes mellitus with hyperglycemia, without long-term current use of insulin (HCC)   ? Debility 02/09/2019  ? Depression   ? Diabetes mellitus type 2 in obese (HCC) 07/04/2013  ? Diabetes mellitus with peripheral vascular disease (HCC)   ? type 2 no meds, lost 100 lbs  ? Dyslipidemia   ? Dyspnea   ? inhaler  ? End stage renal disease (HCC)   ? ESRD (end stage  renal disease) on dialysis (HCC) 01/2019  ? MWFS  ? ESRD on dialysis (HCC)   ? Essential hypertension   ? Fluid overload 02/04/2019  ? GERD (gastroesophageal reflux disease)   ? Goals of care, counseling/discussion   ? Habitual alcohol use 07/04/2013  ? Headache   ? Hyperkalemia 01/30/2019  ? Hypertension   ? Hypoglycemia 01/30/2019  ? Hypokalemia 01/30/2019  ? Labile blood pressure   ? Leukocytosis   ? Lower GI bleed 05/14/2019  ? Macrocytic anemia 01/30/2019  ? Obesity, Class II, BMI 35-39.9, with comorbidity 07/04/2013  ? Palliative care encounter   ? PDR (proliferative diabetic retinopathy) (HCC) 12/14/2013  ? Physical deconditioning   ? Pressure injury of skin 04/27/2019  ? Pruritus   ?   Scrotal edema   ? Scrotal pain   ? Sleep apnea   ? uses CPAP  ? Sleep disturbance   ? Slow transit constipation   ? Status post below-knee amputation of left lower extremity (HCC)   ? Stroke (HCC)   ? mini -shown on CT scan  ? Syphilis 01/2019  ? Urinary retention   ? Venous hypertension 09/22/2013  ? Wound infection 03/28/2019  ? ? ?Past Surgical History:  ?Procedure Laterality Date  ? AV FISTULA PLACEMENT Left 01/09/2019  ? Procedure: ARTERIOVENOUS (AV) FISTULA CREATION LEFT UPPER ARM;  Surgeon: Early, Todd F, MD;  Location: [facility] OR;  Service: Vascular;  Laterality: Left;  ? BASCILIC VEIN TRANSPOSITION Left 07/06/2019  ? Procedure: BASILIC VEIN TRANSPOSITION SECOND STAGE LEFT ARM;  Surgeon: Early, Todd F, MD;  Location: [facility] OR;  Service: Vascular;  Laterality: Left;  ? below the knee amputation Left   ? EYE SURGERY Bilateral   ? FLEXIBLE SIGMOIDOSCOPY N/A 05/15/2019  ? Procedure: FLEXIBLE SIGMOIDOSCOPY;  Surgeon: Mansouraty, Gabriel Jr., MD;  Location: [facility];  Service: Gastroenterology;  Laterality: N/A;  ? HEMOSTASIS CLIP PLACEMENT  05/15/2019  ? Procedure: HEMOSTASIS CLIP PLACEMENT;  Surgeon: Mansouraty, Gabriel Jr., MD;  Location: [facility];  Service: Gastroenterology;;  ? HOT HEMOSTASIS N/A 05/15/2019  ? Procedure: HOT  HEMOSTASIS (ARGON PLASMA COAGULATION/BICAP);  Surgeon: Mansouraty, Gabriel Jr., MD;  Location: [facility];  Service: Gastroenterology;  Laterality: N/A;  ? IR FLUORO GUIDE CV LINE RIGHT  01/31/2019  ? IR FLUORO GUIDE CV LINE RIGHT  02/03/2019  ? IR THORACENTESIS ASP PLEURAL SPACE W/IMG GUIDE  11/08/2019  ? IR US GUIDE VASC ACCESS RIGHT  01/31/2019  ? IR US GUIDE VASC ACCESS RIGHT  02/03/2019  ? SCLEROTHERAPY  05/15/2019  ? Procedure: SCLEROTHERAPY;  Surgeon: Mansouraty, Gabriel Jr., MD;  Location: [facility];  Service: Gastroenterology;;  ? ? ?Family History  ?Problem Relation Age of Onset  ? Diabetes Mother   ? Multiple myeloma Mother   ?        ? ?Social History:  reports that he quit smoking about 2 years ago. His smoking use included cigarettes. He started smoking about 2 years ago. He has never used smokeless tobacco. He reports that he does not currently use alcohol. He reports that he does not currently use drugs. ? ?Allergies  ?Allergen Reactions  ? Tape Itching  ?  Prefers silk tape over regular tape  ? ? ?MEDICATIONS:                                                                                                                     ?No current facility-administered medications on file prior to encounter.  ? ?Current Outpatient Medications on File Prior to Encounter  ?Medication Sig Dispense Refill  ? acetaminophen (TYLENOL) 500 MG tablet Take 1,000 mg by mouth every 6 (six) hours as needed for mild pain.    ? albuterol (VENTOLIN HFA) 108 (90 Base) MCG/ACT inhaler Inhale 2 puffs into the lungs every   6 (six) hours as needed for wheezing or shortness of breath.    ? amLODipine (NORVASC) 5 MG tablet Take 5 mg by mouth at bedtime.     ? atorvastatin (LIPITOR) 40 MG tablet Take 1 tablet (40 mg total) by mouth daily. (Patient taking differently: Take 40 mg by mouth at bedtime.)    ? augmented betamethasone dipropionate (DIPROLENE-AF) 0.05 % cream Apply 1 application. topically daily.    ? buPROPion (WELLBUTRIN XL)  300 MG 24 hr tablet Take 300 mg by mouth at bedtime.     ? clindamycin (CLEOCIN) 150 MG capsule Take 450 mg by mouth 3 (three) times daily. 5 day course. On day 4    ? diphenhydrAMINE (BENADRYL) 25 MG tablet Take 50 mg by mouth 2 (two) times daily as needed for itching or sleep.    ? diphenhydramine-acetaminophen (TYLENOL PM) 25-500 MG TABS tablet Take 1 tablet by mouth See admin instructions. 2 tablets the morning of dialysis and 2 tablets 1 hour before dialysis    ? ferric citrate (AURYXIA) 1 GM 210 MG(Fe) tablet Take 630 mg by mouth 3 (three) times daily with meals.    ? ipratropium-albuterol (DUONEB) 0.5-2.5 (3) MG/3ML SOLN Take 3 mLs by nebulization every 6 (six) hours as needed (For shortness of breath).    ? Melatonin 10 MG TABS Take 10 mg by mouth at bedtime.    ? midodrine (PROAMATINE) 10 MG tablet Take 10 mg by mouth See admin instructions. Takes 91m on days with dialysis    ? mupirocin ointment (BACTROBAN) 2 % Apply to right lower extremity once daily. 30 g 1  ? ondansetron (ZOFRAN) 4 MG tablet Take 4 mg by mouth daily as needed for vomiting.    ? oxycodone (OXY-IR) 5 MG capsule Take 1 capsule (5 mg total) by mouth every 6 (six) hours as needed. 20 capsule 0  ? pantoprazole (PROTONIX) 40 MG tablet Take 1 tablet (40 mg total) by mouth daily. (Patient taking differently: Take 40 mg by mouth at bedtime.)    ? Pseudoeph-Doxylamine-DM-APAP (NYQUIL PO) Take 15 mLs by mouth 2 (two) times daily as needed (cough/fever).    ? sucroferric oxyhydroxide (VELPHORO) 500 MG chewable tablet Chew 1,000 mg by mouth 3 (three) times daily with meals.    ? tamsulosin (FLOMAX) 0.4 MG CAPS capsule Take 1 capsule (0.4 mg total) by mouth daily after breakfast. (Patient taking differently: Take 0.4 mg by mouth at bedtime.) 30 capsule   ? traMADol (ULTRAM) 50 MG tablet Take 1 tablet (50 mg total) by mouth every 8 (eight) hours as needed for moderate pain. (Patient taking differently: Take 50 mg by mouth every 6 (six) hours as  needed for moderate pain.) 15 tablet 0  ? vitamin C (ASCORBIC ACID) 500 MG tablet Take 500 mg by mouth daily.    ? bromocriptine (PARLODEL) 2.5 MG tablet Take 1 tablet (2.5 mg total) by mouth at bedtime. (Patient

## 2021-10-07 NOTE — Progress Notes (Signed)
PHARMACY - PHYSICIAN COMMUNICATION ?CRITICAL VALUE ALERT - BLOOD CULTURE IDENTIFICATION (BCID) ? ?Michael Riggs is an 51 y.o. male who presented to [facility] on 10/06/2021 with a chief complaint of weakness, fall, R leg wound ? ?Assessment:  1/4 blood culture bottles growing strep (?contaminant) ? ?Name of physician (or Provider) Contacted: Dr. Owens Shark ? ?Current antibiotics: Cefepime ? ?Changes to prescribed antibiotics recommended:  ?No additional abx needed ? ?Results for orders placed or performed during the hospital encounter of 10/06/21  ?Blood Culture ID Panel (Reflexed) (Collected: 10/06/2021 12:15 PM)  ?Result Value Ref Range  ? Enterococcus faecalis NOT DETECTED NOT DETECTED  ? Enterococcus Faecium NOT DETECTED NOT DETECTED  ? Listeria monocytogenes NOT DETECTED NOT DETECTED  ? Staphylococcus species NOT DETECTED NOT DETECTED  ? Staphylococcus aureus (BCID) NOT DETECTED NOT DETECTED  ? Staphylococcus epidermidis NOT DETECTED NOT DETECTED  ? Staphylococcus lugdunensis NOT DETECTED NOT DETECTED  ? Streptococcus species DETECTED (A) NOT DETECTED  ? Streptococcus agalactiae NOT DETECTED NOT DETECTED  ? Streptococcus pneumoniae NOT DETECTED NOT DETECTED  ? Streptococcus pyogenes NOT DETECTED NOT DETECTED  ? A.calcoaceticus-baumannii NOT DETECTED NOT DETECTED  ? Bacteroides fragilis NOT DETECTED NOT DETECTED  ? Enterobacterales NOT DETECTED NOT DETECTED  ? Enterobacter cloacae complex NOT DETECTED NOT DETECTED  ? Escherichia coli NOT DETECTED NOT DETECTED  ? Klebsiella aerogenes NOT DETECTED NOT DETECTED  ? Klebsiella oxytoca NOT DETECTED NOT DETECTED  ? Klebsiella pneumoniae NOT DETECTED NOT DETECTED  ? Proteus species NOT DETECTED NOT DETECTED  ? Salmonella species NOT DETECTED NOT DETECTED  ? Serratia marcescens NOT DETECTED NOT DETECTED  ? Haemophilus influenzae NOT DETECTED NOT DETECTED  ? Neisseria meningitidis NOT DETECTED NOT DETECTED  ? Pseudomonas aeruginosa NOT DETECTED NOT DETECTED  ?  Stenotrophomonas maltophilia NOT DETECTED NOT DETECTED  ? Candida albicans NOT DETECTED NOT DETECTED  ? Candida auris NOT DETECTED NOT DETECTED  ? Candida glabrata NOT DETECTED NOT DETECTED  ? Candida krusei NOT DETECTED NOT DETECTED  ? Candida parapsilosis NOT DETECTED NOT DETECTED  ? Candida tropicalis NOT DETECTED NOT DETECTED  ? Cryptococcus neoformans/gattii NOT DETECTED NOT DETECTED  ? ? ?Sherlon Handing, PharmD, BCPS ?Please see amion for complete clinical pharmacist phone list ?10/07/2021  5:07 PM ? ?

## 2021-10-07 NOTE — Hospital Course (Addendum)
Michael Riggs is a 51 y.o.male with a history of ESRD on HD, HTN, left BKA, HLD, T2DM, anemia of chronic disease, history of CVA, OSA who was admitted to the [facility] for infected chronic R leg wound and fall with near syncope. His hospital course is detailed below: ? ?Infected chronic R leg wound ?Patient presented due to chronic right lower extremity wound with failed antibiotic (clindamycin) therapy.  Right ankle x-ray shows possible nondisplaced distal tibial fracture.  Right ankle MRI was performed but was severely motion degraded but did not show evidence of acute osseous abnormality and only mild tenosynovitis of the flexor tendons within the forefoot.  Podiatry was consulted and recommended ABIs and orthopedic consult.  Wound care was also consulted and provided recommendations for RLE wound. Patient was started on vancomycin and cefepime as well as obtain blood cultures x2.  Blood cultures showed positive for strep species.  Discussed with ID who believes to be contaminant.  ABIs showed noncompressible right lower extremity arteries.  Right toe brachial was mild/moderately decreased.  Orthopedics performed surgical debridement on 3/24. Aortogram on 3/29 was performed and obtained successful balloon angioplasty of the distal superficial femoral artery, proximal anterior tibial artery, tibioperoneal trunk with drug-eluting angioplasty performed. ID advised 6 weeks total of antibiotics given calcaneus involvement in debridement which patient can receive during HD treatments. Vitamin and micronutrient labs drawn given poor wound healing. Pt was started on Vit C, zinc supplementation. Discharged with wound vac in place and to follow up with Ortho in 1 week. ? ?Fall with near syncope ?Head CT shows 13 mm infarct within the ventral and central pons which is new compared to head CT of 08/30/2021 but no acute posttraumatic intracranial findings. MRI shows punctate acute  infarcts versus artifact within the right parietal occipital cortex and left ventral pons. Neuro stated that these foci were motion artifacts and not true infarcts but did recommend baby aspirin given history of previous strokes and statin. They also recommended DAPT after all procedures have been completed. Echo was obtained for possible cardiac etiology of falls/near syncopal events and showed LVEF 40% and global hypokinesis of LV with Grade 3 diastolic dysfunction (restrictive). Cardiology was consulted and they recommended left heart cath given low EF but given low troponin, recommended this be done as an outpatient. Pt received loading dose of Plavix after aortogram and instructions to take plavix 75mg  daily, lipitor 40mg  daily, ASA 81mg  daily.  ? ?ESRD on HD MWF ?Nephrology was consulted and managed patient's dialysis. ? ?Other chronic conditions were medically managed with home medications and formulary alternatives as necessary (HTN, HLD, anemia of chronic disease, T2DM, hx of stroke, OSA) ? ?PCP Follow-up Recommendations: ?Follow L pleural effusion in the outpatient setting ?Cardiology follow up outpatient for LHC ?Continue Cefepime and Vancomycin for 6 weeks total during HD (end date 11/21/21) ?Follow up with stroke clinic NP at [facility] in 3 weeks ?Follow up with Vascular surgery in 4-6 weeks ?Follow up with Orthopedic surgery (Dr. Sharol Given) in 1 week for wound vac ?Hepatitis B vaccination (HBsAb negative)  ?

## 2021-10-07 NOTE — Evaluation (Signed)
Occupational Therapy Evaluation ?Patient Details ?Name: Michael Riggs ?MRN: [id number] ?DOB: 1971/05/28 ?Today's Date: 10/07/2021 ? ? ?History of Present Illness Pt is a 51 y/o M presenting to ED for facial and L knee pain after fall/near syncopal episode. PMH includes L BKA, ESRD on HD, HTN, DM2, CVA and anemia of chronic disease.  ? ?Clinical Impression ?  ?Pt reports independence at baseline with ADLs and functional mobility, lives with parent and brother, and helps care for mother who has Alzheimer's dementia. Reports using w/c for longer distances, prosthetic and no AD for shorter distances. Pt currently set up - max A for ADLs, supervision for bed mobility, and min guard for lateral scoot transfer to chair. Pt very anxious during session, verbose and tangential at times, needing redirection. Pt presenting with impairments listed below, will follow acutely. Recommend HHOT at d/c pending pt progress. ?   ? ?Recommendations for follow up therapy are one component of a multi-disciplinary discharge planning process, led by the attending physician.  Recommendations may be updated based on patient status, additional functional criteria and insurance authorization.  ? ?Follow Up Recommendations ? Home health OT (vs SNF pending progress)  ?  ?Assistance Recommended at Discharge Intermittent Supervision/Assistance  ?Patient can return home with the following A little help with walking and/or transfers;A little help with bathing/dressing/bathroom ? ?  ?Functional Status Assessment ? Patient has had a recent decline in their functional status and demonstrates the ability to make significant improvements in function in a reasonable and predictable amount of time.  ?Equipment Recommendations ? BSC/3in1  ?  ?Recommendations for Other Services PT consult ? ? ?  ?Precautions / Restrictions Precautions ?Precautions: Fall ?Restrictions ?Weight Bearing Restrictions: Yes ?RLE Weight Bearing: Weight bearing as tolerated (for  transfers only, per podiatry 3/21) ?LLE Weight Bearing: Non weight bearing  ? ?  ? ?Mobility Bed Mobility ?Overal bed mobility: Needs Assistance ?Bed Mobility: Supine to Sit ?  ?  ?Supine to sit: Supervision ?  ?  ?  ?  ? ?Transfers ?Overall transfer level: Needs assistance ?Equipment used: None ?Transfers: Bed to chair/wheelchair/BSC ?  ?  ?  ?  ?  ? Lateral/Scoot Transfers: Min guard ?  ?  ? ?  ?Balance Overall balance assessment: Needs assistance ?Sitting-balance support: Bilateral upper extremity supported, Feet supported ?Sitting balance-Leahy Scale: Good ?  ?  ?  ?  ?  ?  ?  ?  ?  ?  ?  ?  ?  ?  ?  ?  ?   ? ?ADL either performed or assessed with clinical judgement  ? ?ADL Overall ADL's : Needs assistance/impaired ?Eating/Feeding: Set up;Sitting ?  ?Grooming: Set up;Sitting ?  ?Upper Body Bathing: Set up;Sitting ?  ?Lower Body Bathing: Moderate assistance;Sitting/lateral leans ?  ?Upper Body Dressing : Set up;Sitting ?Upper Body Dressing Details (indicate cue type and reason): to don gown ?Lower Body Dressing: Maximal assistance;Sitting/lateral leans ?Lower Body Dressing Details (indicate cue type and reason): to don sock ?Toilet Transfer: Min guard;BSC/3in1 ?  ?Toileting- Clothing Manipulation and Hygiene: Minimal assistance;Sitting/lateral lean ?  ?  ?  ?Functional mobility during ADLs: Min guard ?   ? ? ? ?Vision   ?Vision Assessment?: No apparent visual deficits  ?   ?Perception   ?  ?Praxis   ?  ? ?Pertinent Vitals/Pain Pain Assessment ?Pain Assessment: Faces ?Pain Score: 7  ?Faces Pain Scale: Hurts even more ?Pain Location: head, BLE's and shoulders ?Pain Descriptors / Indicators: Discomfort ?Pain Intervention(s): Limited  activity within patient's tolerance, Monitored during session  ? ? ? ?Hand Dominance Right ?  ?Extremity/Trunk Assessment Upper Extremity Assessment ?Upper Extremity Assessment: Overall WFL for tasks assessed ?  ?Lower Extremity Assessment ?Lower Extremity Assessment: Defer to PT  evaluation ?  ?  ?  ?Communication Communication ?Communication: No difficulties;Other (comment) (hyperverbose at times) ?  ?Cognition Arousal/Alertness: Awake/alert ?Behavior During Therapy: Anxious, WFL for tasks assessed/performed, Restless ?Overall Cognitive Status: Within Functional Limits for tasks assessed ?  ?  ?  ?  ?  ?  ?  ?  ?  ?  ?  ?  ?  ?  ?  ?  ?General Comments: a & o x4, able to recall events leading up to hospital admission ?  ?  ?General Comments  VSS on RA ? ?  ?Exercises   ?  ?Shoulder Instructions    ? ? ?Home Living Family/patient expects to be discharged to:: Private residence ?Living Arrangements: Parent;Other relatives (brother lives there as well) ?Available Help at Discharge: Family;Available PRN/intermittently ?Type of Home: Apartment ?Home Access: Level entry ?  ?  ?Home Layout: One level ?  ?  ?Bathroom Shower/Tub: Tub/shower unit ?  ?Bathroom Toilet: Standard ?  ?  ?Home Equipment: Hand held shower head;Tub bench;Wheelchair - manual;Cane - single point ?  ?Additional Comments: brother also lives with pt and parent ?  ? ?  ?Prior Functioning/Environment Prior Level of Function : Independent/Modified Independent ?  ?  ?  ?  ?  ?  ?Mobility Comments: uses w/c to get from apartment to car, uses prosthetic and cane to go short distances ?ADLs Comments: caretaker for mother with Alzheimer's dementia, does IADLs ?  ? ?  ?  ?OT Problem List: Decreased strength;Decreased range of motion;Decreased activity tolerance;Impaired balance (sitting and/or standing);Decreased knowledge of use of DME or AE ?  ?   ?OT Treatment/Interventions: Self-care/ADL training;Therapeutic exercise;DME and/or AE instruction;Therapeutic activities;Patient/family education;Balance training  ?  ?OT Goals(Current goals can be found in the care plan section) Acute Rehab OT Goals ?Patient Stated Goal: none stated ?OT Goal Formulation: With patient ?Time For Goal Achievement: 10/21/21 ?Potential to Achieve Goals: Good ?ADL  Goals ?Pt Will Perform Upper Body Dressing: with modified independence;sitting ?Pt Will Perform Lower Body Dressing: with min guard assist;sitting/lateral leans ?Pt Will Transfer to Toilet: bedside commode;squat pivot transfer;with min guard assist ?Pt Will Perform Tub/Shower Transfer: Shower transfer;Tub transfer;rolling walker;ambulating;shower seat;Stand pivot transfer  ?OT Frequency: Min 2X/week ?  ? ?Co-evaluation   ?  ?  ?  ?  ? ?  ?AM-PAC OT "6 Clicks" Daily Activity     ?Outcome Measure Help from another person eating meals?: None ?Help from another person taking care of personal grooming?: None ?Help from another person toileting, which includes using toliet, bedpan, or urinal?: A Lot ?Help from another person bathing (including washing, rinsing, drying)?: A Lot ?Help from another person to put on and taking off regular upper body clothing?: None ?Help from another person to put on and taking off regular lower body clothing?: A Lot ?6 Click Score: 18 ?  ?End of Session Nurse Communication: Mobility status ? ?Activity Tolerance: Patient tolerated treatment well ?Patient left: in chair;with call bell/phone within reach;with chair alarm set ? ?OT Visit Diagnosis: Unsteadiness on feet (R26.81);Other abnormalities of gait and mobility (R26.89);Muscle weakness (generalized) (M62.81)  ?              ?Time: 2025-4270 ?OT Time Calculation (min): 28 min ?Charges:  OT General Charges ?$OT  Visit: 1 Visit ?OT Evaluation ?$OT Eval Low Complexity: 1 Low ?OT Treatments ?$Self Care/Home Management : 8-22 mins ? ?Lynnda Child, OTD, OTR/L ?Acute Rehab ?[phone] ? ?Kaylyn Lim ?10/07/2021, 12:46 PM ?

## 2021-10-07 NOTE — Consult Note (Signed)
Renal Service ?Consult Note ?[facility] ? ?Michael Riggs ?10/07/2021 ?[name], MD ?Requesting Physician: Dr. Ardelia Mems ? ?Reason for Consult: ESRD pt w/  ?HPI: The patient is a 51 y.o. year-old w/ hx of ESRD on HD, L BKA, HTN, HL, DM2, anemia of chronic disease, hx CVA who presented to ED yesterday w/ a fall and near syncope. This happened on his way to dialysis yesterday. He received 1 hour of dialysis then was sent to the hospital ED. He had some recent outpt cultures done and received IV abx w/ dialysis. In ED pt was afebrile and did not appear septic. Pt was admitted and given IV vanc/ cefepime and podiatry was consulted for opinion about R foot infection. Pain meds started. Asked to see for dialysis.  ? ?Pt seen in room. On HD for about 2 yrs.  Has DM2 and lost his left leg to BKA, now having infection in R foot. He cut out sugary drinks and is working on cutting out all flours too.  Last A1C was < 5 per the pt. He lives in an apt w/ his mother, sometimes his brother is there too. Quit etoh about 1 yr ago after many yrs of alcoholism.   ? ?ROS - denies CP, no joint pain, no HA, no blurry vision, no rash, no diarrhea, no nausea/ vomiting ? ? ?Past Medical History  ?Past Medical History:  ?Diagnosis Date  ? Anemia   ? Anemia of chronic disease   ? Anxiety   ? ARF (acute renal failure) ([place]) 01/30/2019  ? Calciphylaxis   ? Controlled type 2 diabetes mellitus with hyperglycemia, without long-term current use of insulin ([place])   ? Debility 02/09/2019  ? Depression   ? Diabetes mellitus type 2 in obese ([place]) 07/04/2013  ? Diabetes mellitus with peripheral vascular disease ([place])   ? type 2 no meds, lost 100 lbs  ? Dyslipidemia   ? Dyspnea   ? inhaler  ? End stage renal disease ([place])   ? ESRD (end stage renal disease) on dialysis ([place]) 01/2019  ? MWFS  ? ESRD on dialysis [facility])   ? Essential hypertension   ? Fluid overload 02/04/2019  ? GERD (gastroesophageal reflux disease)   ? Goals of care,  counseling/discussion   ? Habitual alcohol use 07/04/2013  ? Headache   ? Hyperkalemia 01/30/2019  ? Hypertension   ? Hypoglycemia 01/30/2019  ? Hypokalemia 01/30/2019  ? Labile blood pressure   ? Leukocytosis   ? Lower GI bleed 05/14/2019  ? Macrocytic anemia 01/30/2019  ? Obesity, Class II, BMI 35-39.9, with comorbidity 07/04/2013  ? Palliative care encounter   ? PDR (proliferative diabetic retinopathy) ([place]) 12/14/2013  ? Physical deconditioning   ? Pressure injury of skin 04/27/2019  ? Pruritus   ? Scrotal edema   ? Scrotal pain   ? Sleep apnea   ? uses CPAP  ? Sleep disturbance   ? Slow transit constipation   ? Status post below-knee amputation of left lower extremity ([place])   ? Stroke [facility])   ? mini -shown on CT scan  ? Syphilis 01/2019  ? Urinary retention   ? Venous hypertension 09/22/2013  ? Wound infection 03/28/2019  ? ?Past Surgical History  ?Past Surgical History:  ?Procedure Laterality Date  ? AV FISTULA PLACEMENT Left 01/09/2019  ? Procedure: ARTERIOVENOUS (AV) FISTULA CREATION LEFT UPPER ARM;  Surgeon: [name], MD;  Location: [place];  Service: Vascular;  Laterality: Left;  ? [place]  TRANSPOSITION Left 07/06/2019  ? Procedure: BASILIC VEIN TRANSPOSITION SECOND STAGE LEFT ARM;  Surgeon: [name], MD;  [facility];  Service: Vascular;  Laterality: Left;  ? below the knee amputation Left   ? EYE SURGERY Bilateral   ? FLEXIBLE SIGMOIDOSCOPY N/A 05/15/2019  ? Procedure: FLEXIBLE SIGMOIDOSCOPY;  Surgeon: Irving Copas., MD;  Location: [place];  Service: Gastroenterology;  Laterality: N/A;  ? HEMOSTASIS CLIP PLACEMENT  05/15/2019  ? Procedure: HEMOSTASIS CLIP PLACEMENT;  Surgeon: Irving Copas., MD;  Location: [place];  Service: Gastroenterology;;  ? HOT HEMOSTASIS N/A 05/15/2019  ? Procedure: HOT HEMOSTASIS (ARGON PLASMA COAGULATION/BICAP);  Surgeon: Irving Copas., MD;  Location: [place];  Service: Gastroenterology;  Laterality: N/A;  ? IR FLUORO GUIDE CV  LINE RIGHT  01/31/2019  ? IR FLUORO GUIDE CV LINE RIGHT  02/03/2019  ? IR THORACENTESIS ASP PLEURAL SPACE W/IMG GUIDE  11/08/2019  ? IR US GUIDE VASC ACCESS RIGHT  01/31/2019  ? IR US GUIDE VASC ACCESS RIGHT  02/03/2019  ? SCLEROTHERAPY  05/15/2019  ? Procedure: SCLEROTHERAPY;  Surgeon: Mansouraty, Telford Nab., MD;  Location: [place];  Service: Gastroenterology;;  ? ?Family History  ?Family History  ?Problem Relation Age of Onset  ? Diabetes Mother   ? Multiple myeloma Mother   ? ?Social History  reports that he quit smoking about 2 years ago. His smoking use included cigarettes. He started smoking about 2 years ago. He has never used smokeless tobacco. He reports that he does not currently use alcohol. He reports that he does not currently use drugs. ?Allergies  ?Allergies  ?Allergen Reactions  ? Tape Itching  ?  Prefers silk tape over regular tape  ? ?Home medications ?Prior to Admission medications   ?Medication Sig Start Date End Date Taking? Authorizing Provider  ?acetaminophen (TYLENOL) 500 MG tablet Take 1,000 mg by mouth every 6 (six) hours as needed for mild pain.   Yes [provider]  ?albuterol (VENTOLIN HFA) 108 (90 Base) MCG/ACT inhaler Inhale 2 puffs into the lungs every 6 (six) hours as needed for wheezing or shortness of breath.   Yes [provider]  ?amLODipine (NORVASC) 5 MG tablet Take 5 mg by mouth at bedtime.    Yes [provider]  ?atorvastatin (LIPITOR) 40 MG tablet Take 1 tablet (40 mg total) by mouth daily. ?Patient taking differently: Take 40 mg by mouth at bedtime. 03/13/19  Yes Angiulli, Lavon Paganini, PA-C  ?augmented betamethasone dipropionate (DIPROLENE-AF) 0.05 % cream Apply 1 application. topically daily. 09/18/21  Yes [provider]  ?buPROPion (WELLBUTRIN XL) 300 MG 24 hr tablet Take 300 mg by mouth at bedtime.  09/27/19  Yes [provider]  ?clindamycin (CLEOCIN) 150 MG capsule Take 450 mg by mouth 3 (three) times daily. 5 day course. On  day 4 10/02/21  Yes [provider]  ?diphenhydrAMINE (BENADRYL) 25 MG tablet Take 50 mg by mouth 2 (two) times daily as needed for itching or sleep.   Yes [provider]  ?diphenhydramine-acetaminophen (TYLENOL PM) 25-500 MG TABS tablet Take 1 tablet by mouth See admin instructions. 2 tablets the morning of dialysis and 2 tablets 1 hour before dialysis   Yes [provider]  ?ferric citrate (AURYXIA) 1 GM 210 MG(Fe) tablet Take 630 mg by mouth 3 (three) times daily with meals. 03/12/21  Yes [provider]  ?ipratropium-albuterol (DUONEB) 0.5-2.5 (3) MG/3ML SOLN Take 3 mLs by nebulization every 6 (six) hours as needed (For  shortness of breath).   Yes [provider]  ?Melatonin 10 MG TABS Take 10 mg by mouth at bedtime.   Yes [provider]  ?midodrine (PROAMATINE) 10 MG tablet Take 10 mg by mouth See admin instructions. Takes 108m on days with dialysis 09/17/20  Yes [provider]  ?mupirocin ointment (BACTROBAN) 2 % Apply to right lower extremity once daily. 05/22/21  Yes GMarzetta Board DPM  ?ondansetron (ZOFRAN) 4 MG tablet Take 4 mg by mouth daily as needed for vomiting. 01/01/21  Yes [provider]  ?oxycodone (OXY-IR) 5 MG capsule Take 1 capsule (5 mg total) by mouth every 6 (six) hours as needed. 07/06/19  Yes EDagoberto Ligas PA-C  ?pantoprazole (PROTONIX) 40 MG tablet Take 1 tablet (40 mg total) by mouth daily. ?Patient taking differently: Take 40 mg by mouth at bedtime. 03/13/19  Yes Angiulli, DLavon Paganini PA-C  ?Pseudoeph-Doxylamine-DM-APAP (NYQUIL PO) Take 15 mLs by mouth 2 (two) times daily as needed (cough/fever).   Yes [provider]  ?sucroferric oxyhydroxide (VELPHORO) 500 MG chewable tablet Chew 1,000 mg by mouth 3 (three) times daily with meals.   Yes [provider]  ?tamsulosin (FLOMAX) 0.4 MG CAPS capsule Take 1 capsule (0.4 mg total) by mouth daily after breakfast. ?Patient taking differently: Take 0.4  mg by mouth at bedtime. 03/13/19  Yes Angiulli, DLavon Paganini PA-C  ?traMADol (ULTRAM) 50 MG tablet Take 1 tablet (50 mg total) by mouth every 8 (eight) hours as needed for moderate pain. ?Patient taking di

## 2021-10-07 NOTE — Progress Notes (Signed)
Patient places self on CPAP. RT will monitor as needed. ?

## 2021-10-07 NOTE — Consult Note (Signed)
Podiatry Consult Note ? ?To: Dr. Regenia Skeeter ?Reason for consult: Right Achilles wound ?From: Dr. Cannon Kettle, podiatry ? ?HPI: ?Michael Riggs is a 51 y.o. male diabetic patient who seen at bedside for evaluation of right heel/Achilles wound. Patient states that he has been treated at the wound care center in [place] and states that it seems like it was doing a little bit better but the last 3 days it seemed to worsen and states that when he was last in the wound care center a chunk of white skin came off and he was advised to go to the hospital because the wound was getting worse.  Patient also had history of a fall and states that because of this he also came to the hospital.  Patient admits an episode of nausea but denies fever or chills or any other constitutional symptoms at this time.   ? ?Patient is diabetic with history of left BKA. ? ?Patient Active Problem List  ? Diagnosis Date Noted  ? Nonhealing wound of heel 10/06/2021  ? 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 Personal history of COVID-19 09/23/2020  ? COVID-19 09/11/2020  ? Acute respiratory failure with hypoxia ([place]) 11/06/2019  ? CAP (community acquired pneumonia) 10/11/2019  ? Generalized hyperhidrosis 10/11/2019  ? Allergy, unspecified, initial encounter 10/03/2019  ? Diarrhea, unspecified 07/24/2019  ? Lower GI bleed 05/14/2019  ? Pressure injury of skin 04/27/2019  ? Admission for palliative care 04/27/2019  ? Major depressive disorder, single episode, unspecified 04/27/2019  ? Other disorders of calcium metabolism 04/27/2019  ? Palliative care encounter   ? Goals of care, counseling/discussion   ? Calciphylaxis   ? Wound infection 03/28/2019  ? Hypertension   ? Dyslipidemia   ? Diabetes mellitus with peripheral vascular disease ([place])   ? Depression   ? Physical deconditioning   ? Coagulation defect, unspecified ([place]) 03/15/2019  ? Other malaise 03/14/2019  ? Other specified disorders of the male genital organs 03/14/2019  ? Other specified  symptoms and signs involving the circulatory and respiratory systems 03/14/2019  ? Unspecified protein-calorie malnutrition ([place]) 03/14/2019  ? Pruritus   ? Scrotal pain   ? Labile blood pressure   ? Scrotal edema   ? Status post below-knee amputation of left lower extremity ([place])   ? Slow transit constipation   ? Leukocytosis   ? Sleep disturbance   ? Essential hypertension   ? Anemia of chronic disease   ? Controlled type 2 diabetes mellitus with hyperglycemia, without long-term current use of insulin ([place])   ? ESRD on dialysis [facility])   ? Urinary retention   ? Debility 02/09/2019  ? Fluid overload 02/04/2019  ? Dyspnea, unspecified 02/03/2019  ? Gastro-esophageal reflux disease without esophagitis 02/03/2019  ? Hyperlipidemia, unspecified 02/03/2019  ? Iron deficiency anemia, unspecified 02/03/2019  ? Major depressive disorder, recurrent, in partial remission ([place]) 02/03/2019  ? Other problems related to lifestyle 02/03/2019  ? Pain in right leg 02/03/2019  ? Pain, unspecified 02/03/2019  ? Psychophysiologic insomnia 02/03/2019  ? Secondary hyperparathyroidism of renal origin ([place]) 02/03/2019  ? Testicular hypofunction 02/03/2019  ? Hyperkalemia 01/30/2019  ? ARF (acute renal failure) ([place]) 01/30/2019  ? Hypokalemia 01/30/2019  ? Hypoglycemia 01/30/2019  ? Syphilis 01/30/2019  ? Macrocytic anemia 01/30/2019  ? ESRD (end stage renal disease) ([place])   ? ESRD (end stage renal disease) on dialysis ([place]) 01/2019  ? PDR (proliferative diabetic retinopathy) ([place]) 12/14/2013  ? Venous hypertension 09/22/2013  ? Acquired absence of left leg below knee (  [place]) 07/20/2013  ? Diabetes mellitus type 2 in obese ([place]) 07/04/2013  ? Habitual alcohol use 07/04/2013  ? Obesity, Class II, BMI 35-39.9, with comorbidity 07/04/2013  ? ? ?No current facility-administered medications on file prior to encounter.  ? ?Current Outpatient Medications on File Prior to Encounter  ?Medication Sig Dispense Refill  ? acetaminophen (TYLENOL) 500 MG  tablet Take 1,000 mg by mouth every 6 (six) hours as needed for mild pain.    ? albuterol (VENTOLIN HFA) 108 (90 Base) MCG/ACT inhaler Inhale 2 puffs into the lungs every 6 (six) hours as needed for wheezing or shortness of breath.    ? amLODipine (NORVASC) 5 MG tablet Take 5 mg by mouth at bedtime.     ? atorvastatin (LIPITOR) 40 MG tablet Take 1 tablet (40 mg total) by mouth daily. (Patient taking differently: Take 40 mg by mouth at bedtime.)    ? augmented betamethasone dipropionate (DIPROLENE-AF) 0.05 % cream Apply 1 application. topically daily.    ? buPROPion (WELLBUTRIN XL) 300 MG 24 hr tablet Take 300 mg by mouth at bedtime.     ? clindamycin (CLEOCIN) 150 MG capsule Take 450 mg by mouth 3 (three) times daily. 5 day course. On day 4    ? diphenhydrAMINE (BENADRYL) 25 MG tablet Take 50 mg by mouth 2 (two) times daily as needed for itching or sleep.    ? diphenhydramine-acetaminophen (TYLENOL PM) 25-500 MG TABS tablet Take 1 tablet by mouth See admin instructions. 2 tablets the morning of dialysis and 2 tablets 1 hour before dialysis    ? ferric citrate (AURYXIA) 1 GM 210 MG(Fe) tablet Take 630 mg by mouth 3 (three) times daily with meals.    ? ipratropium-albuterol (DUONEB) 0.5-2.5 (3) MG/3ML SOLN Take 3 mLs by nebulization every 6 (six) hours as needed (For shortness of breath).    ? Melatonin 10 MG TABS Take 10 mg by mouth at bedtime.    ? midodrine (PROAMATINE) 10 MG tablet Take 10 mg by mouth See admin instructions. Takes 10mg  on days with dialysis    ? mupirocin ointment (BACTROBAN) 2 % Apply to right lower extremity once daily. 30 g 1  ? ondansetron (ZOFRAN) 4 MG tablet Take 4 mg by mouth daily as needed for vomiting.    ? oxycodone (OXY-IR) 5 MG capsule Take 1 capsule (5 mg total) by mouth every 6 (six) hours as needed. 20 capsule 0  ? pantoprazole (PROTONIX) 40 MG tablet Take 1 tablet (40 mg total) by mouth daily. (Patient taking differently: Take 40 mg by mouth at bedtime.)    ?  Pseudoeph-Doxylamine-DM-APAP (NYQUIL PO) Take 15 mLs by mouth 2 (two) times daily as needed (cough/fever).    ? sucroferric oxyhydroxide (VELPHORO) 500 MG chewable tablet Chew 1,000 mg by mouth 3 (three) times daily with meals.    ? tamsulosin (FLOMAX) 0.4 MG CAPS capsule Take 1 capsule (0.4 mg total) by mouth daily after breakfast. (Patient taking differently: Take 0.4 mg by mouth at bedtime.) 30 capsule   ? traMADol (ULTRAM) 50 MG tablet Take 1 tablet (50 mg total) by mouth every 8 (eight) hours as needed for moderate pain. (Patient taking differently: Take 50 mg by mouth every 6 (six) hours as needed for moderate pain.) 15 tablet 0  ? vitamin C (ASCORBIC ACID) 500 MG tablet Take 500 mg by mouth daily.    ? bromocriptine (PARLODEL) 2.5 MG tablet Take 1 tablet (2.5 mg total) by mouth at bedtime. (Patient not taking: Reported  on 10/06/2021) 90 tablet 3  ? diclofenac Sodium (VOLTAREN) 1 % GEL Apply 2 g topically 4 (four) times daily. (Patient not taking: Reported on 10/06/2021) 100 g 0  ? DULoxetine (CYMBALTA) 20 MG capsule Take 20 mg by mouth 2 (two) times daily. (Patient not taking: Reported on 10/06/2021)    ? Hydrocortisone (GERHARDT'S BUTT CREAM) CREA Apply 1 application topically 3 (three) times daily as needed for irritation. (Patient not taking: Reported on 10/06/2021) 1 each 0  ? sildenafil (VIAGRA) 100 MG tablet Take 1 tablet (100 mg total) by mouth daily as needed for erectile dysfunction. (Patient not taking: Reported on 10/06/2021) 10 tablet 11  ? ? ?Allergies  ?Allergen Reactions  ? Tape Itching  ?  Prefers silk tape over regular tape  ? ? ?Past Surgical History:  ?Procedure Laterality Date  ? AV FISTULA PLACEMENT Left 01/09/2019  ? Procedure: ARTERIOVENOUS (AV) FISTULA CREATION LEFT UPPER ARM;  Surgeon: [name], MD;  Location: [place];  Service: Vascular;  Laterality: Left;  ? BASCILIC VEIN TRANSPOSITION Left 07/06/2019  ? Procedure: BASILIC VEIN TRANSPOSITION SECOND STAGE LEFT ARM;  Surgeon: [name], MD;  [facility];  Service: Vascular;  Laterality: Left;  ? below the knee amputation Left   ? EYE SURGERY Bilateral   ? FLEXIBLE SIGMOIDOSCOPY N/A 05/15/2019  ? Procedure: FLEXIBLE SIGMOIDOSCOPY;  Surgeon: Mansouraty,

## 2021-10-07 NOTE — Consult Note (Signed)
?Cardiology Consultation:  ? ?[name] ?MRN: [id number]; DOB: [birth year] ? ?Admit date: 10/06/2021 ?Date of Consult: 10/07/2021 ? ?PCP:  [name], MD ?  ?[facility] Providers ?Cardiologist:  New ? ?Patient Profile:  ? ?Michael Riggs is a 51 y.o. male with a hx of end-stage renal disease on hemodialysis, hypertension, hyperlipidemia, diabetes, chronic anemia, CVA and peripheral arterial disease s/p left BKA who is being seen 10/07/2021 for the evaluation of Low EF at the request of Dr. Ardelia Mems. ? ?No prior cardiac history.  Patient was a previous alcoholic.  Being on dialysis for past 2 years. ? ?History of Present Illness:  ? ?Michael Riggs had fall/near syncope while walking into dialysis session x 2 yesterday.  No prodromal chest pain or shortness of breath but did felt dizzy.  Hit his head on the pavement.  He completed about 1 hour of treatment then decided to come to hospital.  Reported nausea & vomiting without fever or chills over the weekend.  Patient has prosthetic left leg.  Limited ambulation.  He is dealing with right lower extremity wound over Achilles for past few months.  Has been seen by wound center and being treated with antibiotic.  Per note, clindamycin did not work.  Culture growing Staph aureus and Serratia.  ? ?MRI of ankle without evidence of osteomyelitis.  Seen by podiatry and recommending opinion from orthopedic for limb salvation.  MRI of brain showing acute infarct versus artifact.  Pending neurology evaluation. ? ?1 of blood culture growing strep, concerning for contamination. ? ?Echocardiogram showed reduced LV function is 40% with global hypokinesis.  Grade 2 diastolic dysfunction.  RVSP 41.8 mmHg.  Cardiology is asked for further evaluation. ? ?Echo 10/07/21 ? 1. Left ventricular ejection fraction, by estimation, is 40%. The left  ?ventricle has mildly decreased function. The left ventricle demonstrates  ?global hypokinesis. There is moderate left  ventricular hypertrophy. Left  ?ventricular diastolic parameters are  ? consistent with Grade III diastolic dysfunction (restrictive).  ? 2. Right ventricular systolic function is low normal. The right  ?ventricular size is normal. There is mildly elevated pulmonary artery  ?systolic pressure. The estimated right ventricular systolic pressure is  ?32.2 mmHg.  ? 3. Right atrial size was mildly dilated.  ? 4. The mitral valve is abnormal. No evidence of mitral valve  ?regurgitation. No evidence of mitral stenosis.  ? 5. The aortic valve is tricuspid. There is moderate calcification of the  ?aortic valve. Aortic valve regurgitation is trivial. No aortic stenosis is  ?present.  ? ?Comparison(s): LV Function is worse from prior.  ? ?Conclusion(s)/Recommendation(s): Mitral valve and aortic valve are  ?abnormally calcified compared to age matched controls, but would be  ?consistent with ESRD.  ? ?ABI ?Summary:  ?Right: Resting right ankle-brachial index indicates noncompressible right  ?lower extremity arteries. The right toe-brachial index is abnormal  ?(mild/moderately decreased).  ? ?Past Medical History:  ?Diagnosis Date  ? Anemia   ? Anemia of chronic disease   ? Anxiety   ? ARF (acute renal failure) ([place]) 01/30/2019  ? Calciphylaxis   ? Controlled type 2 diabetes mellitus with hyperglycemia, without long-term current use of insulin ([place])   ? Debility 02/09/2019  ? Depression   ? Diabetes mellitus type 2 in obese (Michael Riggs) 07/04/2013  ? Diabetes mellitus with peripheral vascular disease (Michael Riggs)   ? type 2 no meds, lost 100 lbs  ? Dyslipidemia   ? Dyspnea   ? inhaler  ? End stage renal  disease (Michael Riggs)   ? ESRD (end stage renal disease) on dialysis (Michael Riggs) 01/2019  ? MWFS  ? ESRD on dialysis [facility])   ? Essential hypertension   ? Fluid overload 02/04/2019  ? GERD (gastroesophageal reflux disease)   ? Goals of care, counseling/discussion   ? Habitual alcohol use 07/04/2013  ? Headache   ? Hyperkalemia 01/30/2019  ? Hypertension   ?  Hypoglycemia 01/30/2019  ? Hypokalemia 01/30/2019  ? Labile blood pressure   ? Leukocytosis   ? Lower GI bleed 05/14/2019  ? Macrocytic anemia 01/30/2019  ? Obesity, Class II, BMI 35-39.9, with comorbidity 07/04/2013  ? Palliative care encounter   ? PDR (proliferative diabetic retinopathy) (Michael Riggs) 12/14/2013  ? Physical deconditioning   ? Pressure injury of skin 04/27/2019  ? Pruritus   ? Scrotal edema   ? Scrotal pain   ? Sleep apnea   ? uses CPAP  ? Sleep disturbance   ? Slow transit constipation   ? Status post below-knee amputation of left lower extremity (Michael Riggs)   ? Stroke [facility])   ? mini -shown on CT scan  ? Syphilis 01/2019  ? Urinary retention   ? Venous hypertension 09/22/2013  ? Wound infection 03/28/2019  ? ? ?Past Surgical History:  ?Procedure Laterality Date  ? AV FISTULA PLACEMENT Left 01/09/2019  ? Procedure: ARTERIOVENOUS (AV) FISTULA CREATION LEFT UPPER ARM;  Surgeon: [name], MD;  Location: [place];  Service: Vascular;  Laterality: Left;  ? BASCILIC VEIN TRANSPOSITION Left 07/06/2019  ? Procedure: BASILIC VEIN TRANSPOSITION SECOND STAGE LEFT ARM;  Surgeon: [name], MD;  Location: [facility];  Service: Vascular;  Laterality: Left;  ? below the knee amputation Left   ? EYE SURGERY Bilateral   ? FLEXIBLE SIGMOIDOSCOPY N/A 05/15/2019  ? Procedure: FLEXIBLE SIGMOIDOSCOPY;  Surgeon: Irving Copas., MD;  Location: [place];  Service: Gastroenterology;  Laterality: N/A;  ? HEMOSTASIS CLIP PLACEMENT  05/15/2019  ? Procedure: HEMOSTASIS CLIP PLACEMENT;  Surgeon: Irving Copas., MD;  Location: [place];  Service: Gastroenterology;;  ? HOT HEMOSTASIS N/A 05/15/2019  ? Procedure: HOT HEMOSTASIS (ARGON PLASMA COAGULATION/BICAP);  Surgeon: Irving Copas., MD;  Location: [place];  Service: Gastroenterology;  Laterality: N/A;  ? IR FLUORO GUIDE CV LINE RIGHT  01/31/2019  ? IR FLUORO GUIDE CV LINE RIGHT  02/03/2019  ? IR THORACENTESIS ASP PLEURAL SPACE W/IMG GUIDE  11/08/2019  ? IR US  GUIDE VASC ACCESS RIGHT  01/31/2019  ? IR US GUIDE VASC ACCESS RIGHT  02/03/2019  ? SCLEROTHERAPY  05/15/2019  ? Procedure: SCLEROTHERAPY;  Surgeon: Mansouraty, Telford Nab., MD;  Location: [place];  Service: Gastroenterology;;  ?  ? ?Inpatient Medications: ?Scheduled Meds: ? amLODipine  5 mg Oral QHS  ? vitamin C  500 mg Oral Daily  ? aspirin EC  81 mg Oral Daily  ? atorvastatin  40 mg Oral QHS  ? buPROPion  300 mg Oral Daily  ? [START ON 10/08/2021] calcitRIOL  2 mcg Oral Q M,W,F-HD  ? [START ON 10/08/2021] Chlorhexidine Gluconate Cloth  6 each Topical Q0600  ? ferric citrate  630 mg Oral TID WC  ? heparin injection (subcutaneous)  5,000 Units Subcutaneous Q8H  ? [START ON 10/08/2021] midodrine  10 mg Oral Q M,W,F-HD  ? pantoprazole  40 mg Oral QHS  ? ramelteon  8 mg Oral QHS  ? sucroferric oxyhydroxide  1,000 mg Oral TID WC  ? tamsulosin  0.4 mg Oral QHS  ? ?Continuous Infusions: ?  ceFEPime (MAXIPIME) IV 1 g (10/07/21 1440)  ? ?PRN Meds: ?acetaminophen, albuterol, ipratropium-albuterol, oxyCODONE ? ?Allergies:    ?Allergies  ?Allergen Reactions  ? Tape Itching  ?  Prefers silk tape over regular tape  ? ? ?Social History:   ?Social History  ? ?Socioeconomic History  ? Marital status: Divorced  ?  Spouse name: Not on file  ? Number of children: Not on file  ? Years of education: Not on file  ? Highest education level: Not on file  ?Occupational History  ? Not on file  ?Tobacco Use  ? Smoking status: Former  ?  Types: Cigarettes  ?  Start date: 11/18/2018  ?  Quit date: 01/18/2019  ?  Years since quitting: 2.7  ? Smokeless tobacco: Never  ? Tobacco comments:  ?  pt stated got bored with it  ?Vaping Use  ? Vaping Use: Never used  ?Substance and Sexual Activity  ? Alcohol use: Not Currently  ?  Comment: heavy drinker in the past, none since 07/19/18  ? Drug use: Not Currently  ? Sexual activity: Not Currently  ?Other Topics Concern  ? Not on file  ?Social History Narrative  ? Not on file  ? ?Social Determinants of Health   ? ?Financial Resource Strain: Not on file  ?Food Insecurity: Not on file  ?Transportation Needs: Not on file  ?Physical Activity: Not on file  ?Stress: Not on file  ?Social Connections: Not on file  ?Intimate Par

## 2021-10-07 NOTE — Progress Notes (Signed)
ABI has been completed. ? ?Results can be found under chart review under CV PROC. ?10/07/2021 12:53 PM ?Draylen Lobue RVT, RDMS ? ?

## 2021-10-07 NOTE — Progress Notes (Signed)
PT Cancellation Note ? ?Patient Details ?Name: Trayquan Kolakowski ?MRN: [id number] ?DOB: 1971-06-01 ? ? ?Cancelled Treatment:    Reason Eval/Treat Not Completed: Patient at procedure or test/unavailable; will follow-up for PT Evaluation as schedule permits. ? ?Mabeline Caras, PT, DPT ?Acute Rehabilitation Services  ?Pager [phone] ?Office [phone] ? ?Derry Lory ?10/07/2021, 3:58 PM ?

## 2021-10-07 NOTE — Progress Notes (Addendum)
FPTS Brief Progress Note ? ?S: Patient seen resting comfortably on CPAP.  Patient was sleeping so did not wake up.  Overnight nurse reports patient takes Ultram for pain and patient has been complaining of pain. Patient has oxy IR q6 hr prn.  Patient to be n.p.o. at midnight for left heart cath tomorrow per cardiology note. ? ? ?O: ?BP 138/82 (BP Location: Right Arm)   Pulse 96   Temp 98.3 ?F (36.8 ?C) (Oral)   Resp 17   Ht 5\' 10"  (1.778 m)   Wt 100.9 kg   SpO2 97%   BMI 31.92 kg/m?   ? ? ?A/P: ?-N.p.o. at midnight in anticipation for [facility] ?-Plan per day team ?- Orders reviewed. Labs for AM ordered, which was adjusted as needed.  ?- If condition changes, plan includes bedside evaluation.  ? ?France Ravens, MD ?10/07/2021, 11:16 PM ?PGY-1, [place] [facility] Night Resident  ?Please page [phone] with questions.  ? ?

## 2021-10-07 NOTE — Progress Notes (Signed)
Family Medicine Teaching Service ?Daily Progress Note ?Intern Pager: [phone] ? ?Patient name: Michael Riggs Medical record number: [id number] ?Date of birth: 1970-11-03 Age: 51 y.o. Gender: male ? ?Primary Care Provider: [name], MD ?Consultants: Podiatry, Nephrology ?Code Status: Full ? ?Pt Overview and Major Events to Date:  ?3/20: admitted ? ?Assessment and Plan: ?Johnathyn Viscomi is a 51 y.o. male presenting with fall and near syncope and R leg wound. PMH is significant for ESRD on HD, HTN, left BKA, HLD, T2DM, anemia of chronic disease, history of CVA. ? ?Infected chronic R leg wound ?MR R ankle showed no acute osseous abnormality.  No evidence of osteomyelitis. Podiatry assessed patient and noted high risk for BKA and recommended wound care, antibiotics, and deferring to orthopedics.  Additionally recommending ABIs with potential vascular consult.  Blood cultures show no growth times less than 24 hours.  CBC with differential essentially unchanged with WBC 11.7 and ANC 9.9.  Cultures faxed from previous provider Konrad Felix, NP) with notable growth for Serratia, Morganella, Staph aureus (oxacillin susceptible) ?-Orthopedics consulted, appreciate recommendations ?-Wound care following, appreciate assistance ?-Follow-up ABIs ?-Discontinue vancomycin, continue cefepime ?-Follow-up blood cultures ?-A.m. CBC with differential ?-Tylenol 1000 mg every 6 hours as needed, Oxy IR 5 mg every 6 hours as needed ? ?Fall w/ near syncope  acute CVA  hx of CVA  HFrEF ?MR brain showed two punctate foci of apparent diffusion-weighted signal hyperintensity within the left ventral pons, which may reflect ?punctate acute infarcts or artifact. Punctate acute infarct versus artifact within the right parietooccipital cortex. Overall moderate chronic small vessel ischemic disease with multiple chronic lacunar infarcts, as described. Mild generalized cerebral atrophy.  No focal neurological deficits however  patient does have horizontal nystagmus.  Echo showed LVEF 40% with global hypokinesis, moderate LVH, G3 DD.  This is an acute change from last echo.  Patient will need both neurology and cardiology neurology on board for near syncopal episode and worsening cardiac function with continued work-up. ?-Neurology consulted, appreciate recommendations ?-Consult cardiology, appreciate recommendations ?-continue ASA 81mg  ?-EKG ?-A.m. magnesium, BMP ?-Orthostatic vitals ? ?ESRD on HD MWF ?Will require dialysis today. Received partial HD yesterday. ?-Nephrology following, appreciate recommendations ?-Renal diet with fluid restriction ? ?HTN: Stable ?Blood pressure stable in the 120s to 130s over 80s to 90s. ?-Continue amlodipine 5 mg daily ? ?HLD: Stable ?Lipid panel unremarkable with exception of decreased HDL at 26. ?-Continue atorvastatin 40 mg daily ? ?Anemia of chronic disease: Stable ?Hemoglobin 13.1. ?-Monitor CBC ? ?FEN/GI: renal with fluid restriction ?PPx: Heparin subq q8h ?Dispo: pending surgery and workup ? ?Subjective:  ?Patient is upset with his breakfast because he did not realize he had to have a diet with ESRD.  Normally at home he eats only protein.  Patient states he is feeling well today but is sad about the unhappy news that he may lose his right lower extremity.  Does not have any complaints or concerns at this time. ? ?Objective: ?Temp:  [97.9 ?F (36.6 ?C)-98.9 ?F (37.2 ?C)] 98 ?F (36.7 ?C) (03/21 3614) ?Pulse Rate:  [80-98] 98 (03/21 0743) ?Resp:  [15-23] 18 (03/21 0743) ?BP: (116-137)/(74-93) 137/93 (03/21 0743) ?SpO2:  [97 %-100 %] 100 % (03/21 0743) ?Weight:  [99.8 kg] 99.8 kg (03/20 1214) ?Physical Exam: ?General: Awake, alert, NAD ?Cardiovascular: RRR, no murmurs auscultated ?Respiratory: CTA B, no increased work of breathing ?Abdomen: Soft, obese abdomen, normoactive bowel sounds ?Extremities: Left BKA, scars present from prior calciphylaxis removal ?Neuro: Present nystagmus, cranial nerves II  through XII intact, finger-nose cerebellar testing normal, sensation intact with the exception of scars along right lower extremity from prior calciphylaxis ? ?Laboratory: ?Recent Labs  ?Lab 10/06/21 ?1243 10/07/21 ?0645  ?WBC 11.9* 11.7*  ?HGB 13.2 13.1  ?HCT 42.1 40.0  ?PLT 202 197  ? ?Recent Labs  ?Lab 10/06/21 ?1243 10/07/21 ?0645  ?NA 133* 130*  ?K 4.7 4.8  ?CL 91* 87*  ?CO2 29 24  ?BUN 45* 58*  ?CREATININE 8.17* 9.62*  ?CALCIUM 9.0 9.5  ?PROT 7.3  --   ?BILITOT 1.5*  --   ?ALKPHOS 92  --   ?ALT 15  --   ?AST 19  --   ?GLUCOSE 118* 123*  ? ? ?Lipid Panel  ?   ?Component Value Date/Time  ? CHOL 75 10/07/2021 0645  ? TRIG 82 10/07/2021 0645  ? HDL 26 (L) 10/07/2021 0645  ? CHOLHDL 2.9 10/07/2021 0645  ? VLDL 16 10/07/2021 0645  ? [place] 33 10/07/2021 0645  ? ? ? ?Imaging/Diagnostic Tests: ?MR BRAIN WO CONTRAST ? ?Result Date: 10/06/2021 ?CLINICAL DATA:  Neuro deficit, acute, stroke suspected. EXAM: MRI HEAD WITHOUT CONTRAST TECHNIQUE: Multiplanar, multiecho pulse sequences of the brain and surrounding structures were obtained without intravenous contrast. COMPARISON:  Prior head CT examinations 10/06/2021 and earlier. FINDINGS: Mild intermittent motion degradation. Brain: Mild generalized cerebral atrophy. There are two punctate foci of diffusion-weighted hyperintensity within the left ventral pons (series 5, image 69) (series 7, image 55). These may reflect punctate acute infarcts or artifact. Bilateral chronic lacunar infarcts, and background chronic small vessel ischemic disease, also present within the pons. Punctate focus of diffusion weighted signal hyperintensity within the right parietooccipital cortex, which may reflect a punctate acute infarct or artifact (series 5, image 84) (series 7, image 40). Chronic lacunar infarcts within the left lentiform nucleus, anterior limb of left internal capsule and left thalamus. Chronic hemosiderin deposition associated with the chronic lacunar infarct within the left  lentiform nucleus. Multifocal T2 FLAIR hyperintense signal abnormality elsewhere within the cerebral white matter nonspecific but compatible with chronic small vessel ischemic disease. A small focus of diffusion-weighted signal hyperintensity within the right frontal lobe subcortical white matter demonstrates corresponding T2 shinethrough on the ADC map (series 5, image 88) (series 6, image 36). No evidence of an intracranial mass. No extra-axial fluid collection. No midline shift. Vascular: Maintained flow voids within the proximal large arterial vessels. Skull and upper cervical spine: No focal suspicious marrow lesion. Sinuses/Orbits: Visualized orbits show no acute finding. Small mucous retention cyst within the right maxillary sinus. Trace mucosal thickening within the right sphenoid and bilateral ethmoid sinuses. IMPRESSION: Two punctate foci of apparent diffusion-weighted signal hyperintensity within the left ventral pons, which may reflect punctate acute infarcts or artifact. Punctate acute infarct versus artifact within the right parietooccipital cortex. Overall moderate chronic small vessel ischemic disease with multiple chronic lacunar infarcts, as described. Mild generalized cerebral atrophy. Mild paranasal sinus disease, as described. Electronically Signed   By: Kellie Simmering D.O.   On: 10/06/2021 19:25  ? ?MR ANKLE RIGHT WO CONTRAST ? ?Result Date: 10/06/2021 ?CLINICAL DATA:  Osteomyelitis suspected, ankle, xray done EXAM: MRI OF THE RIGHT ANKLE WITHOUT CONTRAST TECHNIQUE: Multiplanar, multisequence MR imaging of the ankle was performed. No intravenous contrast was administered. COMPARISON:  X-ray 10/06/2021 FINDINGS: Technical Note: Despite efforts by the technologist and patient, motion artifact is present on today's exam and could not be eliminated. This reduces exam sensitivity and specificity. Bones/Joint/Cartilage No acute fracture. No dislocation. No bone  marrow edema. No erosion or periostitis.  No significant arthropathy. No joint effusion. Ligaments No evidence of acute ligamentous injury of the ankle or hindfoot. Muscles and Tendons Tendinous structures of the ankle appear intact. Small volume teno

## 2021-10-08 ENCOUNTER — Other Ambulatory Visit (HOSPITAL_COMMUNITY): Payer: Medicare Other

## 2021-10-08 ENCOUNTER — Inpatient Hospital Stay (HOSPITAL_COMMUNITY): Payer: Medicare Other

## 2021-10-08 ENCOUNTER — Encounter (HOSPITAL_COMMUNITY)
Admission: EM | Disposition: A | Discharge status: Home / Self Care | Payer: Self-pay | Source: Ambulatory Visit | Attending: Family Medicine

## 2021-10-08 DIAGNOSIS — I739 Peripheral vascular disease, unspecified: Secondary | ICD-10-CM | POA: Diagnosis not present

## 2021-10-08 DIAGNOSIS — L03115 Cellulitis of right lower limb: Secondary | ICD-10-CM

## 2021-10-08 DIAGNOSIS — N185 Chronic kidney disease, stage 5: Secondary | ICD-10-CM

## 2021-10-08 DIAGNOSIS — E7849 Other hyperlipidemia: Secondary | ICD-10-CM

## 2021-10-08 DIAGNOSIS — S91309A Unspecified open wound, unspecified foot, initial encounter: Secondary | ICD-10-CM | POA: Diagnosis not present

## 2021-10-08 DIAGNOSIS — N186 End stage renal disease: Secondary | ICD-10-CM | POA: Diagnosis not present

## 2021-10-08 DIAGNOSIS — E1152 Type 2 diabetes mellitus with diabetic peripheral angiopathy with gangrene: Secondary | ICD-10-CM

## 2021-10-08 DIAGNOSIS — I6330 Cerebral infarction due to thrombosis of unspecified cerebral artery: Secondary | ICD-10-CM | POA: Insufficient documentation

## 2021-10-08 DIAGNOSIS — I10 Essential (primary) hypertension: Secondary | ICD-10-CM

## 2021-10-08 DIAGNOSIS — E11621 Type 2 diabetes mellitus with foot ulcer: Secondary | ICD-10-CM

## 2021-10-08 DIAGNOSIS — I6523 Occlusion and stenosis of bilateral carotid arteries: Secondary | ICD-10-CM

## 2021-10-08 LAB — CBC WITH DIFFERENTIAL/PLATELET
Abs Immature Granulocytes: 0.2 10*3/uL — ABNORMAL HIGH (ref 0.00–0.07)
Basophils Absolute: 0.1 10*3/uL (ref 0.0–0.1)
Basophils Relative: 1 %
Eosinophils Absolute: 0.2 10*3/uL (ref 0.0–0.5)
Eosinophils Relative: 2 %
HCT: 42.7 % (ref 39.0–52.0)
Hemoglobin: 13.8 g/dL (ref 13.0–17.0)
Immature Granulocytes: 2 %
Lymphocytes Relative: 5 %
Lymphs Abs: 0.5 10*3/uL — ABNORMAL LOW (ref 0.7–4.0)
MCH: 33 pg (ref 26.0–34.0)
MCHC: 32.3 g/dL (ref 30.0–36.0)
MCV: 102.2 fL — ABNORMAL HIGH (ref 80.0–100.0)
Monocytes Absolute: 1.6 10*3/uL — ABNORMAL HIGH (ref 0.1–1.0)
Monocytes Relative: 15 %
Neutro Abs: 7.8 10*3/uL — ABNORMAL HIGH (ref 1.7–7.7)
Neutrophils Relative %: 75 %
Platelets: 204 10*3/uL (ref 150–400)
RBC: 4.18 MIL/uL — ABNORMAL LOW (ref 4.22–5.81)
RDW: 16.5 % — ABNORMAL HIGH (ref 11.5–15.5)
WBC: 10.4 10*3/uL (ref 4.0–10.5)
nRBC: 0 % (ref 0.0–0.2)

## 2021-10-08 LAB — BASIC METABOLIC PANEL
Anion gap: 16 — ABNORMAL HIGH (ref 5–15)
BUN: 42 mg/dL — ABNORMAL HIGH (ref 6–20)
CO2: 23 mmol/L (ref 22–32)
Calcium: 9.5 mg/dL (ref 8.9–10.3)
Chloride: 93 mmol/L — ABNORMAL LOW (ref 98–111)
Creatinine, Ser: 8.03 mg/dL — ABNORMAL HIGH (ref 0.61–1.24)
GFR, Estimated: 8 mL/min — ABNORMAL LOW (ref >60)
Glucose, Bld: 134 mg/dL — ABNORMAL HIGH (ref 70–99)
Potassium: 4.6 mmol/L (ref 3.5–5.1)
Sodium: 132 mmol/L — ABNORMAL LOW (ref 135–145)

## 2021-10-08 LAB — MAGNESIUM: Magnesium: 1.9 mg/dL (ref 1.7–2.4)

## 2021-10-08 LAB — GLUCOSE, CAPILLARY
Glucose-Capillary: 126 mg/dL — ABNORMAL HIGH (ref 70–99)
Glucose-Capillary: 235 mg/dL — ABNORMAL HIGH (ref 70–99)

## 2021-10-08 LAB — HEPATITIS B SURFACE ANTIBODY, QUANTITATIVE: Hep B S AB Quant (Post): 7.6 m[IU]/mL — ABNORMAL LOW (ref >9.9)

## 2021-10-08 LAB — HEMOGLOBIN A1C
Hgb A1c MFr Bld: 6.2 % — ABNORMAL HIGH (ref 4.8–5.6)
Mean Plasma Glucose: 131.24 mg/dL

## 2021-10-08 LAB — PHOSPHORUS: Phosphorus: 4.4 mg/dL (ref 2.5–4.6)

## 2021-10-08 LAB — TROPONIN I (HIGH SENSITIVITY): Troponin I (High Sensitivity): 25 ng/L — ABNORMAL HIGH (ref <18)

## 2021-10-08 IMAGING — MR MR MRA HEAD W/O CM
1 series · 19 of 48 positions shown · non-contrast
Comparison: Brain MRI 2 days ago

CLINICAL DATA: Stroke follow-up

EXAM:
MRA HEAD WITHOUT CONTRAST
TECHNIQUE: Angiographic images of the Circle of Willis were acquired using MRA
technique without intravenous contrast.

[Series 3: (id) mt fs · axial · 1.4mm · 0.43mm/px · z∈[-27,+61]mm · 19 of 136 slices shown]
[im 1/136]
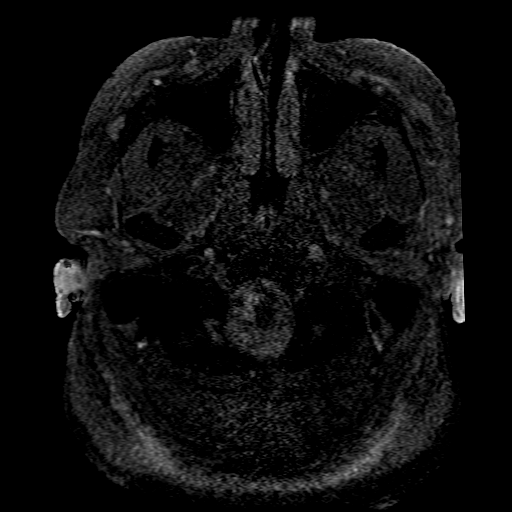
[im 3/136]
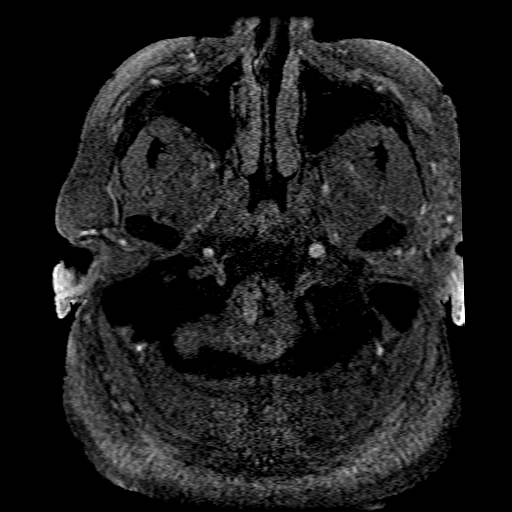
[im 6/136]
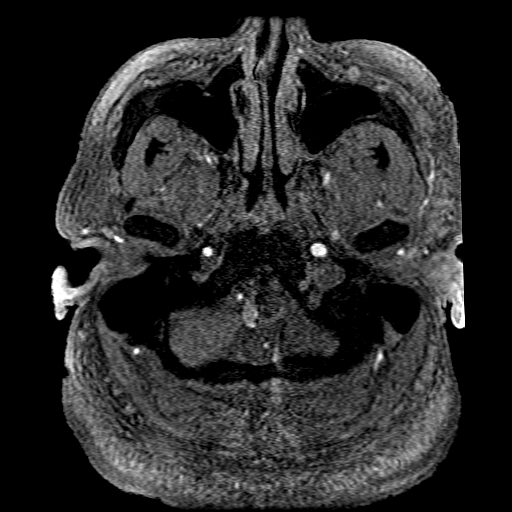
[im 9/136]
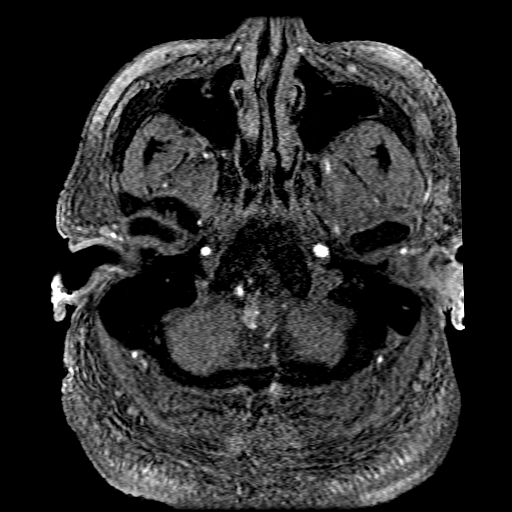
[im 12/136]
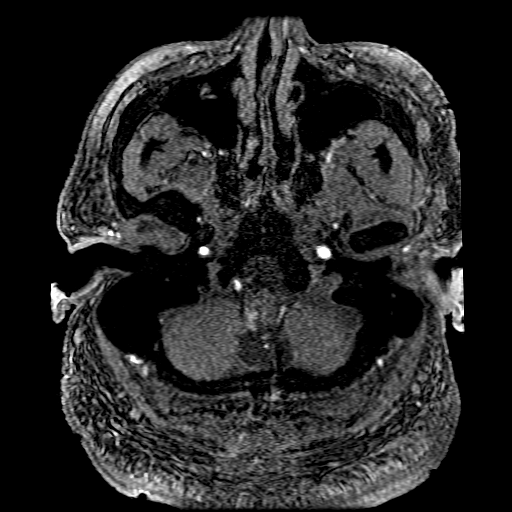
[im 15/136]
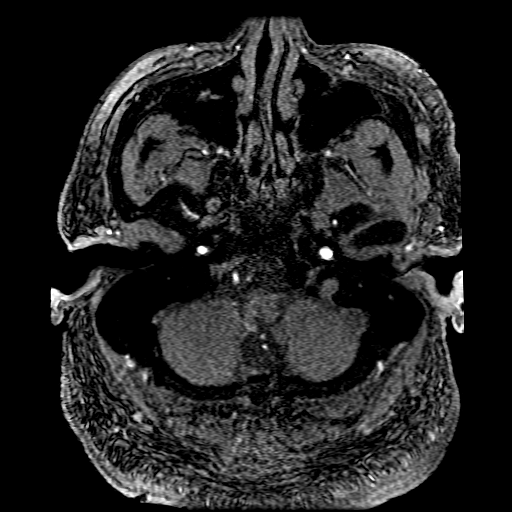
[im 18/136]
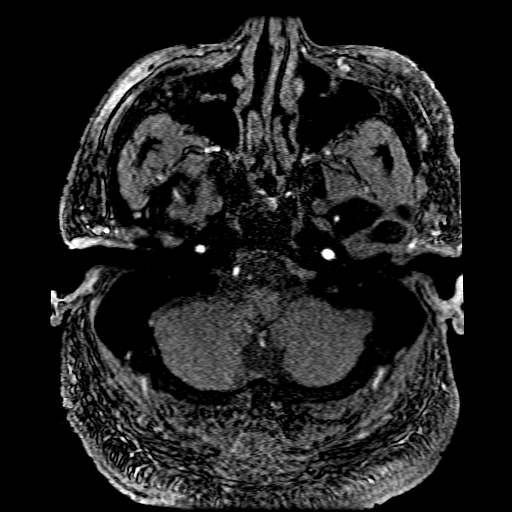
[im 21/136]
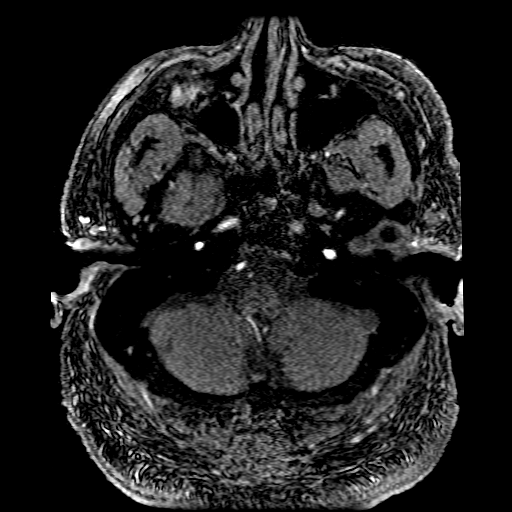
[im 23/136]
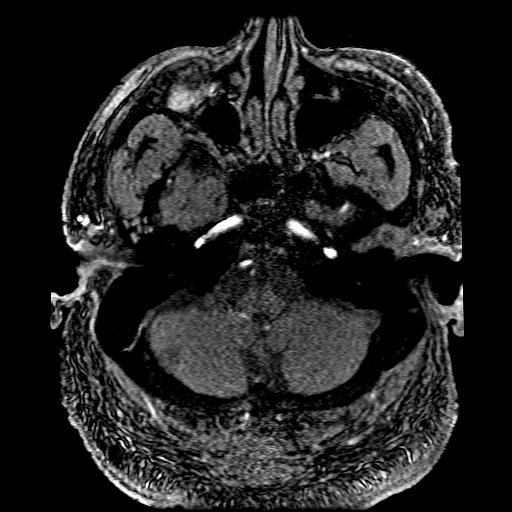
[im 26/136]
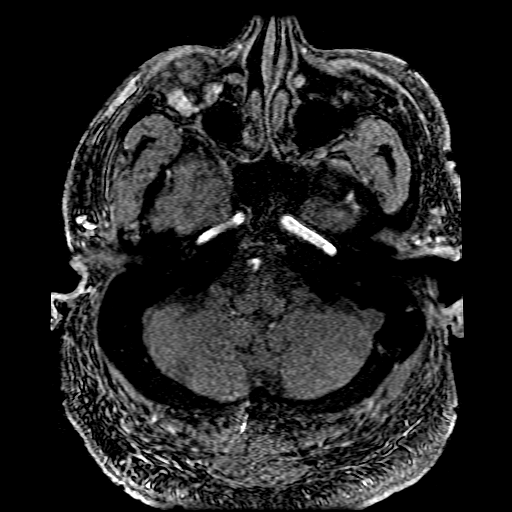
[im 29/136]
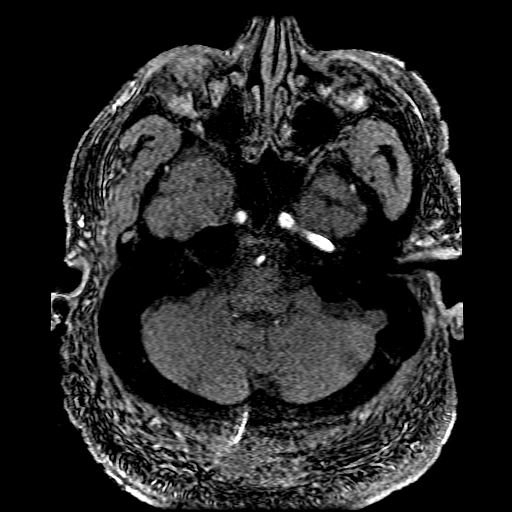
[im 44/136]
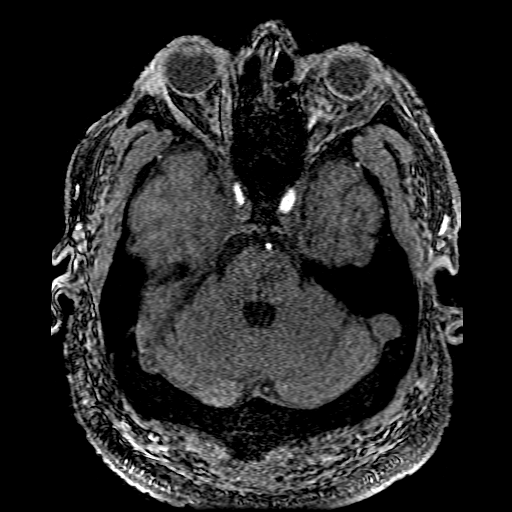
[im 61/136]
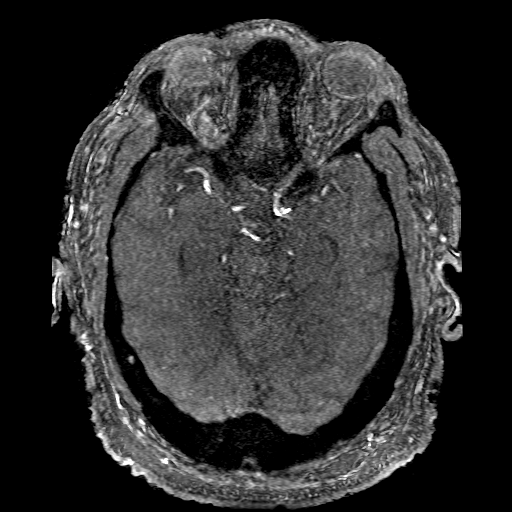
[im 69/136]
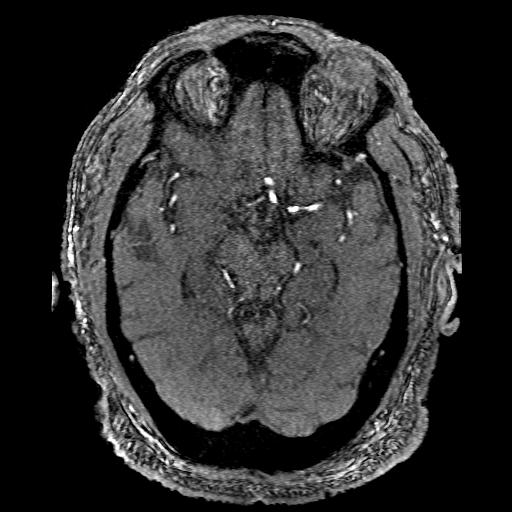
[im 78/136]
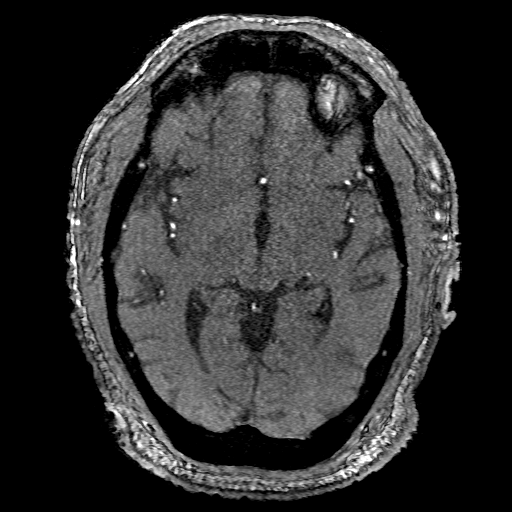
[im 95/136]
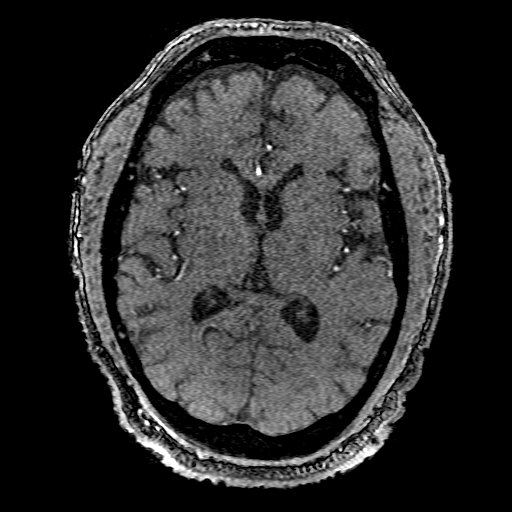
[im 113/136]
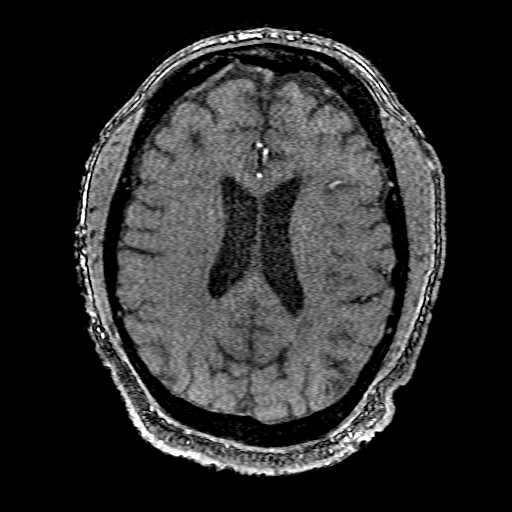
[im 115/136]
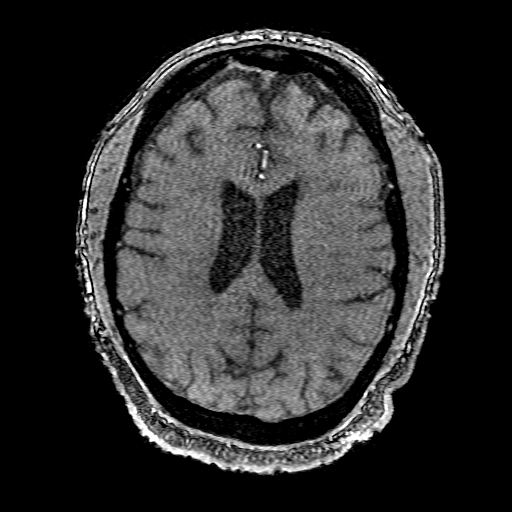
[im 130/136]
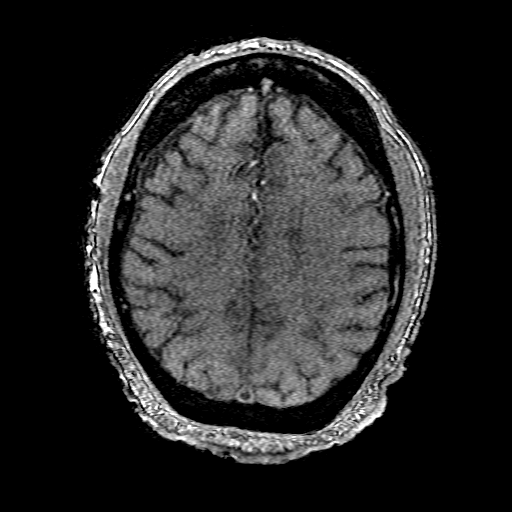

[19 of 48 positions shown; findings below may reference images not displayed]

FINDINGS: Anterior circulation: Smaller right ICA, at least partially
developmental given fetal type left PCA flow and a hypoplastic right
A1 segment. Tandem high-grade narrowings of the right ICA at the
anterior genu and paraclinoid segments. 1 mm inferiorly pointing
infundibulum or aneurysm at the left supraclinoid ICA. No branch
occlusion, beading, or vascular malformation

Posterior circulation: Diminutive flow in the left vertebral artery.
The right vertebral and basilar arteries are diffusely patent. No
major branch occlusion detected. Origins of the PICA are not covered
bilaterally.

Anatomic variants: As above

Other: Mild generalized motion artifact.
IMPRESSION: 1. Tandem high-grade narrowings of the intracranial right ICA.
2. Diminutive left vertebral, not expected on congenital grounds
given a [MR] chest CTA, consider neck CTA.
3. 1 mm infundibulum or aneurysm of the left supraclinoid ICA.

## 2021-10-08 SURGERY — LEFT HEART CATH AND CORONARY ANGIOGRAPHY
Anesthesia: LOCAL

## 2021-10-08 MED ORDER — ASPIRIN 81 MG PO CHEW
81.0000 mg | CHEWABLE_TABLET | ORAL | Status: DC
Start: 2021-10-09 — End: 2021-10-08
  Filled 2021-10-08: qty 1

## 2021-10-08 MED ORDER — SODIUM CHLORIDE 0.9 % IV SOLN: INTRAVENOUS | Status: DC

## 2021-10-08 NOTE — Progress Notes (Signed)
 [facility] Progress Note   Subjective: Up in chair. C/O starving. No food since yesterday. No other C/Os at present. HD later today on schedule.   Objective Vitals:   10/07/21 2050 10/07/21 2155 10/08/21 0436 10/08/21 0739  BP:  138/82 137/84 130/81  Pulse: 90 96 72 77  Resp: 16 17 17 16   Temp:  98.3 F (36.8 C) 97.8 F (36.6 C) 97.6 F (36.4 C)  TempSrc:  Oral Axillary Oral  SpO2: 97% 97% 99% 96%  Weight:      Height:       Physical Exam General: Pleasant chronically ill appearing male in NAD Heart: S1.S2 no M/R/G.  Lungs: CTAB Abdomen: NABS, NT, ND Extremities: L BKA. No stump edema. RLE with drsg intact.  Dialysis Access: L AVF +T/B  Additional Objective Labs: Basic Metabolic Panel: Recent Labs  Lab 10/06/21 1243 10/07/21 0645 10/08/21 0130  NA 133* 130* 132*  K 4.7 4.8 4.6  CL 91* 87* 93*  CO2 29 24 23   GLUCOSE 118* 123* 134*  BUN 45* 58* 42*  CREATININE 8.17* 9.62* 8.03*  CALCIUM  9.0 9.5 9.5  PHOS  --   --  4.4   Liver Function Tests: Recent Labs  Lab 10/06/21 1243  AST 19  ALT 15  ALKPHOS 92  BILITOT 1.5*  PROT 7.3  ALBUMIN  3.2*   No results for input(s): LIPASE, AMYLASE in the last 168 hours. CBC: Recent Labs  Lab 10/06/21 1243 10/07/21 0645 10/08/21 0130  WBC 11.9* 11.7* 10.4  NEUTROABS 10.5* 9.9* 7.8*  HGB 13.2 13.1 13.8  HCT 42.1 40.0 42.7  MCV 103.2* 101.3* 102.2*  PLT 202 197 204   Blood Culture    Component Value Date/Time   SDES BLOOD RIGHT HAND 10/07/2021 0657   SPECREQUEST  10/07/2021 0657    BOTTLES DRAWN AEROBIC AND ANAEROBIC Blood Culture adequate volume   CULT  10/07/2021 0657    NO GROWTH < 24 HOURS Performed at [facility], [address] [place], [place] [address]    REPTSTATUS PENDING 10/07/2021 9342    Cardiac Enzymes: No results for input(s): CKTOTAL, CKMB, CKMBINDEX, TROPONINI in the last 168 hours. CBG: Recent Labs  Lab 10/06/21 1246 10/06/21 2115 10/07/21 0443  10/08/21 1151  GLUCAP 110* 130* 134* 126*   Iron  Studies: No results for input(s): IRON , TIBC, TRANSFERRIN, FERRITIN in the last 72 hours. @lablastinr3 @ Studies/Results: DG Chest 1 View  Result Date: 10/06/2021 CLINICAL DATA:  Dialysis patient.  Right foot wound EXAM: CHEST  1 VIEW COMPARISON:  11/27/2020 FINDINGS: Artifact overlies the chest. The right hemithorax is clear. There is cardiomegaly. There is increased density in the left lower chest that could be due to a combination of pleural fluid, volume loss and pneumonia. Similar appearance to the study 2022. IMPRESSION: Abnormal density in the left lower chest that could be due to a combination of pleural fluid, volume loss and pneumonia. Electronically Signed   By: Oneil Officer M.D.   On: 10/06/2021 13:16   DG Ankle Complete Right  Result Date: 10/06/2021 CLINICAL DATA:  Right foot wound, fall, initial encounter. EXAM: RIGHT ANKLE - COMPLETE 3+ VIEW COMPARISON:  Right tibia fibula 03/28/2019. FINDINGS: Possible nondisplaced lucency in the anteromedial distal tibial epiphysis, seen on two views. Ankle mortise is intact. Vascular calcifications. IMPRESSION: Possible nondisplaced distal tibial fracture. Electronically Signed   By: Newell Eke M.D.   On: 10/06/2021 13:17   CT Head Wo Contrast  Result Date: 10/06/2021 CLINICAL DATA:  Head trauma, intracranial venous injury suspected. Facial trauma, blunt. EXAM: CT HEAD WITHOUT CONTRAST CT MAXILLOFACIAL WITHOUT CONTRAST TECHNIQUE: Multidetector CT imaging of the head and maxillofacial structures were performed using the standard protocol without intravenous contrast. Multiplanar CT image reconstructions of the maxillofacial structures were also generated. RADIATION DOSE REDUCTION: This exam was performed according to the departmental dose-optimization program which includes automated exposure control, adjustment of the mA and/or kV according to patient size and/or use of iterative reconstruction  technique. COMPARISON:  Head CT 08/30/2021. FINDINGS: CT HEAD FINDINGS Brain: Mild generalized cerebral atrophy. Redemonstrated chronic lacunar infarcts within the left basal ganglia, anterior limb of left internal capsule and left pons. Background mild patchy and ill-defined hypoattenuation within the cerebral white matter, nonspecific but compatible chronic small vessel ischemic disease. There is a 13 mm infarct within the ventral and central pons which is new as compared to the head CT of 08/30/2021. Background chronic small vessel ischemic changes are also present within the pons. There is no acute intracranial hemorrhage. No demarcated cortical infarct. No extra-axial fluid collection. No evidence of an intracranial mass. No midline shift. Vascular: No hyperdense vessel.  Atherosclerotic calcifications. Skull: Normal. Negative for fracture or focal lesion. CT MAXILLOFACIAL FINDINGS Osseous: No acute maxillofacial fracture is identified. Orbits: No acute or significant orbital finding. Sinuses: Small mucous retention cyst within the right maxillary sinus. Soft tissues: Left periorbital soft tissue swelling is questioned. IMPRESSION: CT head: 1. 13 mm infarct within the ventral and central pons. This appears new from the prior head CT of 08/30/2021 and may be acute or subacute. A brain MRI is recommended for further evaluation. 2. No acute post-traumatic intracranial findings. 3. Redemonstrated chronic lacunar infarcts within the left basal ganglia/internal capsule and left pons. Background cerebral white matter and pontine chronic small vessel ischemic disease. 4. Mild generalized cerebral atrophy. CT maxillofacial: 1. No evidence of acute maxillofacial fracture. 2. Left periorbital soft tissue swelling is questioned. 3. Small mucous retention cyst within the right maxillary sinus. Electronically Signed   By: Rockey Childs D.O.   On: 10/06/2021 13:22   MR ANGIO HEAD WO CONTRAST  Result Date:  10/08/2021 CLINICAL DATA:  Stroke follow-up EXAM: MRA HEAD WITHOUT CONTRAST TECHNIQUE: Angiographic images of the Circle of Willis were acquired using MRA technique without intravenous contrast. COMPARISON:  Brain MRI 2 days ago FINDINGS: Anterior circulation: Smaller right ICA, at least partially developmental given fetal type left PCA flow and a hypoplastic right A1 segment. Tandem high-grade narrowings of the right ICA at the anterior genu and paraclinoid segments. 1 mm inferiorly pointing infundibulum or aneurysm at the left supraclinoid ICA. No branch occlusion, beading, or vascular malformation Posterior circulation: Diminutive flow in the left vertebral artery. The right vertebral and basilar arteries are diffusely patent. No major branch occlusion detected. Origins of the PICA are not covered bilaterally. Anatomic variants: As above Other: Mild generalized motion artifact. IMPRESSION: 1. Tandem high-grade narrowings of the intracranial right ICA. 2. Diminutive left vertebral, not expected on congenital grounds given a 2021 chest CTA, consider neck CTA. 3. 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 Electronically Signed   By: Dorn Roulette M.D.   On: 10/08/2021 10:51   MR BRAIN WO CONTRAST  Result Date: 10/06/2021 CLINICAL DATA:  Neuro deficit, acute, stroke suspected. EXAM: MRI HEAD WITHOUT CONTRAST TECHNIQUE: Multiplanar, multiecho pulse sequences of the brain and surrounding structures were obtained without intravenous contrast. COMPARISON:  Prior head CT examinations 10/06/2021 and earlier. FINDINGS: Mild intermittent  motion degradation. Brain: Mild generalized cerebral atrophy. There are two punctate foci of diffusion-weighted hyperintensity within the left ventral pons (series 5, image 69) (series 7, image 55). These may reflect punctate acute infarcts or artifact. Bilateral chronic lacunar infarcts, and background chronic small vessel ischemic disease, also present within the  pons. Punctate focus of diffusion weighted signal hyperintensity within the right parietooccipital cortex, which may reflect a punctate acute infarct or artifact (series 5, image 84) (series 7, image 40). Chronic lacunar infarcts within the left lentiform nucleus, anterior limb of left internal capsule and left thalamus. Chronic hemosiderin deposition associated with the chronic lacunar infarct within the left lentiform nucleus. Multifocal T2 FLAIR hyperintense signal abnormality elsewhere within the cerebral white matter nonspecific but compatible with chronic small vessel ischemic disease. A small focus of diffusion-weighted signal hyperintensity within the right frontal lobe subcortical white matter demonstrates corresponding T2 shinethrough on the ADC map (series 5, image 88) (series 6, image 36). No evidence of an intracranial mass. No extra-axial fluid collection. No midline shift. Vascular: Maintained flow voids within the proximal large arterial vessels. Skull and upper cervical spine: No focal suspicious marrow lesion. Sinuses/Orbits: Visualized orbits show no acute finding. Small mucous retention cyst within the right maxillary sinus. Trace mucosal thickening within the right sphenoid and bilateral ethmoid sinuses. IMPRESSION: Two punctate foci of apparent diffusion-weighted signal hyperintensity within the left ventral pons, which may reflect punctate acute infarcts or artifact. Punctate acute infarct versus artifact within the right parietooccipital cortex. Overall moderate chronic small vessel ischemic disease with multiple chronic lacunar infarcts, as described. Mild generalized cerebral atrophy. Mild paranasal sinus disease, as described. Electronically Signed   By: Rockey Childs D.O.   On: 10/06/2021 19:25   MR ANKLE RIGHT WO CONTRAST  Result Date: 10/06/2021 CLINICAL DATA:  Osteomyelitis suspected, ankle, xray done EXAM: MRI OF THE RIGHT ANKLE WITHOUT CONTRAST TECHNIQUE: Multiplanar,  multisequence MR imaging of the ankle was performed. No intravenous contrast was administered. COMPARISON:  X-ray 10/06/2021 FINDINGS: Technical Note: Despite efforts by the technologist and patient, motion artifact is present on today's exam and could not be eliminated. This reduces exam sensitivity and specificity. Bones/Joint/Cartilage No acute fracture. No dislocation. No bone marrow edema. No erosion or periostitis. No significant arthropathy. No joint effusion. Ligaments No evidence of acute ligamentous injury of the ankle or hindfoot. Muscles and Tendons Tendinous structures of the ankle appear intact. Small volume tenosynovial fluid associated with the flexor tendons within the forefoot. Denervation changes of the visualized lower leg and foot musculature. Soft tissues No discernible soft tissue wound or ulceration evident on motion degraded images. Mild subcutaneous edema. No organized fluid collection. IMPRESSION: 1. Motion degraded images. 2. No acute osseous abnormality.  No evidence of osteomyelitis. 3. Mild tenosynovitis the flexor tendons within the forefoot, nonspecific. Electronically Signed   By: Mabel Converse D.O.   On: 10/06/2021 18:39   VAS US  ABI WITH/WO TBI  Result Date: 10/07/2021  LOWER EXTREMITY DOPPLER STUDY Patient Name:  Dakarri Kessinger  Date of Exam:   10/07/2021 Medical Rec #: [id number]              Accession #:    [id number] Date of Birth: [birth year]              Patient Gender: M Patient Age:   51 years Exam Location:  [facility] Procedure:      VAS US  ABI WITH/WO TBI Referring Phys: TITORYA STOVER --------------------------------------------------------------------------------  Indications: Non-healing RLE achilles wound.  High Risk Factors: Hypertension, hyperlipidemia, Diabetes, past history of                    smoking, prior CVA. Other Factors: ESRD (HD), calciphylaxis, LLE BKA.  Comparison Study: No previous exams Performing Technologist: Hill, Jody RVT,  RDMS  Examination Guidelines: A complete evaluation includes at minimum, Doppler waveform signals and systolic blood pressure reading at the level of bilateral brachial, anterior tibial, and posterior tibial arteries, when vessel segments are accessible. Bilateral testing is considered an integral part of a complete examination. Photoelectric Plethysmograph (PPG) waveforms and toe systolic pressure readings are included as required and additional duplex testing as needed. Limited examinations for reoccurring indications may be performed as noted.  ABI Findings: +---------+------------------+-----+---------+--------+ Right    Rt Pressure (mmHg)IndexWaveform Comment  +---------+------------------+-----+---------+--------+ Brachial 135                    triphasic         +---------+------------------+-----+---------+--------+ PTA                                      >[address] [place]  +---------+------------------+-----+---------+--------+ DP                                       > [address] [place] +---------+------------------+-----+---------+--------+ Great Toe68                0.50 Abnormal          +---------+------------------+-----+---------+--------+ +--------+------------------+-----+--------+-------+ Left    Lt Pressure (mmHg)IndexWaveformComment +--------+------------------+-----+--------+-------+ Brachial                               DIA     +--------+------------------+-----+--------+-------+ +-------+-----------+-----------+------------+------------+ ABI/TBIToday's ABIToday's TBIPrevious ABIPrevious TBI +-------+-----------+-----------+------------+------------+ Right  [place]         0.50                                +-------+-----------+-----------+------------+------------+ Arterial wall calcification precludes accurate ankle pressures and ABIs.  Summary: Right: Resting right ankle-brachial index indicates noncompressible right lower extremity arteries. The right  toe-brachial index is abnormal (mild/moderately decreased).  *See table(s) above for measurements and observations.  Electronically signed by Lonni Blade MD on 10/07/2021 at 4:18:41 PM.    Final    ECHOCARDIOGRAM COMPLETE  Result Date: 10/07/2021    ECHOCARDIOGRAM REPORT   Patient Name:   BERTRAM HADDIX Date of Exam: 10/07/2021 Medical Rec #:  [id number]             Height:       70.0 in Accession #:    [id number]            Weight:       220.0 lb Date of Birth:  [birth year]             BSA:          2.174 m Patient Age:    50 years              BP:           120/83 mmHg Patient Gender: M  HR:           96 bpm. Exam Location:  Inpatient Procedure: 2D Echo, Cardiac Doppler, Color Doppler and Intracardiac            Opacification Agent Indications:    Syncope  History:        Patient has prior history of Echocardiogram examinations. Risk                 Factors:Hypertension, Diabetes and Dyslipidemia.  Sonographer:    Waddell Captain Referring Phys: [id number] LAYMON PARAS MCINTYRE IMPRESSIONS  1. Left ventricular ejection fraction, by estimation, is 40%. The left ventricle has mildly decreased function. The left ventricle demonstrates global hypokinesis. There is moderate left ventricular hypertrophy. Left ventricular diastolic parameters are  consistent with Grade III diastolic dysfunction (restrictive).  2. Right ventricular systolic function is low normal. The right ventricular size is normal. There is mildly elevated pulmonary artery systolic pressure. The estimated right ventricular systolic pressure is 41.8 mmHg.  3. Right atrial size was mildly dilated.  4. The mitral valve is abnormal. No evidence of mitral valve regurgitation. No evidence of mitral stenosis.  5. The aortic valve is tricuspid. There is moderate calcification of the aortic valve. Aortic valve regurgitation is trivial. No aortic stenosis is present. Comparison(s): LV Function is worse from prior.  Conclusion(s)/Recommendation(s): Mitral valve and aortic valve are abnormally calcified compared to age matched controls, but would be consistent with ESRD. FINDINGS  Left Ventricle: Left ventricular ejection fraction, by estimation, is 40%. The left ventricle has mildly decreased function. The left ventricle demonstrates global hypokinesis. The left ventricular internal cavity size was normal in size. There is moderate left ventricular hypertrophy. Left ventricular diastolic parameters are consistent with Grade III diastolic dysfunction (restrictive). Right Ventricle: The right ventricular size is normal. No increase in right ventricular wall thickness. Right ventricular systolic function is low normal. There is mildly elevated pulmonary artery systolic pressure. The tricuspid regurgitant velocity is 2.59 m/s, and with an assumed right atrial pressure of 15 mmHg, the estimated right ventricular systolic pressure is 41.8 mmHg. Left Atrium: Left atrial size was normal in size. Right Atrium: Right atrial size was mildly dilated. Pericardium: Trivial pericardial effusion is present. Presence of epicardial fat layer. Mitral Valve: The mitral valve is abnormal. No evidence of mitral valve regurgitation. No evidence of mitral valve stenosis. Tricuspid Valve: The tricuspid valve is normal in structure. Tricuspid valve regurgitation is not demonstrated. No evidence of tricuspid stenosis. Aortic Valve: The aortic valve is tricuspid. There is moderate calcification of the aortic valve. Aortic valve regurgitation is trivial. Aortic regurgitation PHT measures 386 msec. No aortic stenosis is present. Aortic valve peak gradient measures 5.2 mmHg. Pulmonic Valve: The pulmonic valve was not well visualized. Pulmonic valve regurgitation is trivial. No evidence of pulmonic stenosis. Aorta: The aortic root and ascending aorta are structurally normal, with no evidence of dilitation. IAS/Shunts: No atrial level shunt detected by color  flow Doppler.  LEFT VENTRICLE PLAX 2D LVIDd:         5.80 cm      Diastology LVIDs:         4.60 cm      LV e' medial:    5.98 cm/s LV PW:         1.40 cm      LV E/e' medial:  14.3 LV IVS:        1.30 cm      LV e' lateral:   6.53 cm/s LVOT diam:  2.20 cm      LV E/e' lateral: 13.1 LV SV:         50 LV SV Index:   23 LVOT Area:     3.80 cm  LV Volumes (MOD) LV vol d, MOD A2C: 162.0 ml LV vol d, MOD A4C: 166.0 ml LV vol s, MOD A2C: 83.7 ml LV vol s, MOD A4C: 78.4 ml LV SV MOD A2C:     78.3 ml LV SV MOD A4C:     166.0 ml LV SV MOD BP:      83.0 ml RIGHT VENTRICLE            IVC RV Basal diam:  4.50 cm    IVC diam: 2.30 cm RV S prime:     6.31 cm/s TAPSE (M-mode): 1.5 cm LEFT ATRIUM             Index        RIGHT ATRIUM           Index LA diam:        4.20 cm 1.93 cm/m   RA Area:     25.40 cm LA Vol (A2C):   65.8 ml 30.27 ml/m  RA Volume:   89.20 ml  41.04 ml/m LA Vol (A4C):   66.4 ml 30.55 ml/m LA Biplane Vol: 71.6 ml 32.94 ml/m  AORTIC VALVE AV Area (Vmax): 2.59 cm AV Vmax:        114.00 cm/s AV Peak Grad:   5.2 mmHg LVOT Vmax:      77.70 cm/s LVOT Vmean:     55.400 cm/s LVOT VTI:       0.132 m AI PHT:         386 msec  AORTA Ao Root diam: 3.60 cm Ao Asc diam:  3.40 cm MITRAL VALVE               TRICUSPID VALVE MV Area (PHT): 3.06 cm    TR Peak grad:   26.8 mmHg MV Decel Time: 248 msec    TR Vmax:        259.00 cm/s MV E velocity: 85.70 cm/s                            SHUNTS                            Systemic VTI:  0.13 m                            Systemic Diam: 2.20 cm Stanly Leavens MD Electronically signed by Stanly Leavens MD Signature Date/Time: 10/07/2021/9:45:42 AM    Final    VAS US  CAROTID  Result Date: 10/08/2021 Carotid Arterial Duplex Study Patient Name:  SHELLY NARVIS DEE  Date of Exam:   10/08/2021 Medical Rec #: [id number]              Accession #:    [id number] Date of Birth: [birth year]              Patient Gender: M Patient Age:   8 years Exam Location:  [facility] Procedure:      VAS US  CAROTID Referring Phys: ARY CUMMINS --------------------------------------------------------------------------------  Indications:      CVA. Risk Factors:     Hypertension, hyperlipidemia, Diabetes, past history of  smoking. Other Factors:    ESRD. Comparison Study: No prior studies. Performing Technologist: Vernell Iba RDMS, RVT  Examination Guidelines: A complete evaluation includes B-mode imaging, spectral Doppler, color Doppler, and power Doppler as needed of all accessible portions of each vessel. Bilateral testing is considered an integral part of a complete examination. Limited examinations for reoccurring indications may be performed as noted.  Right Carotid Findings: +----------+--------+--------+--------+--------------------+--------+           PSV cm/sEDV cm/sStenosisPlaque Description  Comments +----------+--------+--------+--------+--------------------+--------+ CCA Prox  68      7                                            +----------+--------+--------+--------+--------------------+--------+ CCA Distal55      7                                            +----------+--------+--------+--------+--------------------+--------+ ICA Prox  27      11      1-39%   calcific and diffuse         +----------+--------+--------+--------+--------------------+--------+ ICA Distal40      16                                           +----------+--------+--------+--------+--------------------+--------+ ECA       86      8                                            +----------+--------+--------+--------+--------------------+--------+ +----------+--------+-------+----------------+-------------------+           PSV cm/sEDV cmsDescribe        Arm Pressure (mmHG) +----------+--------+-------+----------------+-------------------+ [id number]             Multiphasic, WNL                     +----------+--------+-------+----------------+-------------------+ +---------+--------+--+--------+---------+ VertebralPSV cm/s11EDV cm/sAntegrade +---------+--------+--+--------+---------+  Left Carotid Findings: +----------+--------+--------+--------+--------------------+--------+           PSV cm/sEDV cm/sStenosisPlaque Description  Comments +----------+--------+--------+--------+--------------------+--------+ CCA Prox  80      11                                           +----------+--------+--------+--------+--------------------+--------+ CCA Distal54      14                                           +----------+--------+--------+--------+--------------------+--------+ ICA Prox  51      16      1-39%   calcific and diffuse         +----------+--------+--------+--------+--------------------+--------+ ICA Distal42      16                                           +----------+--------+--------+--------+--------------------+--------+  ECA       55                                                   +----------+--------+--------+--------+--------------------+--------+ +----------+--------+--------+---------------------+-------------------+           PSV cm/sEDV cm/sDescribe             Arm Pressure (mmHG) +----------+--------+--------+---------------------+-------------------+ Subclavian115             Left AVF flow pattern                    +----------+--------+--------+---------------------+-------------------+ +---------+--------+--+--------+---------+ VertebralPSV cm/s18EDV cm/sAntegrade +---------+--------+--+--------+---------+   Summary: Right Carotid: Velocities in the right ICA are consistent with a 1-39% stenosis. Left Carotid: Velocities in the left ICA are consistent with a 1-39% stenosis. Vertebrals:  Bilateral vertebral arteries demonstrate antegrade flow. Subclavians: Normal flow hemodynamics were seen in the right subclavian artery.               Left radiobasilic AVF present, subclavian within normal limits              proximal to AVF. *See table(s) above for measurements and observations.     Preliminary    CT Maxillofacial Wo Contrast  Result Date: 10/06/2021 CLINICAL DATA:  Head trauma, intracranial venous injury suspected. Facial trauma, blunt. EXAM: CT HEAD WITHOUT CONTRAST CT MAXILLOFACIAL WITHOUT CONTRAST TECHNIQUE: Multidetector CT imaging of the head and maxillofacial structures were performed using the standard protocol without intravenous contrast. Multiplanar CT image reconstructions of the maxillofacial structures were also generated. RADIATION DOSE REDUCTION: This exam was performed according to the departmental dose-optimization program which includes automated exposure control, adjustment of the mA and/or kV according to patient size and/or use of iterative reconstruction technique. COMPARISON:  Head CT 08/30/2021. FINDINGS: CT HEAD FINDINGS Brain: Mild generalized cerebral atrophy. Redemonstrated chronic lacunar infarcts within the left basal ganglia, anterior limb of left internal capsule and left pons. Background mild patchy and ill-defined hypoattenuation within the cerebral white matter, nonspecific but compatible chronic small vessel ischemic disease. There is a 13 mm infarct within the ventral and central pons which is new as compared to the head CT of 08/30/2021. Background chronic small vessel ischemic changes are also present within the pons. There is no acute intracranial hemorrhage. No demarcated cortical infarct. No extra-axial fluid collection. No evidence of an intracranial mass. No midline shift. Vascular: No hyperdense vessel.  Atherosclerotic calcifications. Skull: Normal. Negative for fracture or focal lesion. CT MAXILLOFACIAL FINDINGS Osseous: No acute maxillofacial fracture is identified. Orbits: No acute or significant orbital finding. Sinuses: Small mucous retention cyst within the right maxillary sinus.  Soft tissues: Left periorbital soft tissue swelling is questioned. IMPRESSION: CT head: 1. 13 mm infarct within the ventral and central pons. This appears new from the prior head CT of 08/30/2021 and may be acute or subacute. A brain MRI is recommended for further evaluation. 2. No acute post-traumatic intracranial findings. 3. Redemonstrated chronic lacunar infarcts within the left basal ganglia/internal capsule and left pons. Background cerebral white matter and pontine chronic small vessel ischemic disease. 4. Mild generalized cerebral atrophy. CT maxillofacial: 1. No evidence of acute maxillofacial fracture. 2. Left periorbital soft tissue swelling is questioned. 3. Small mucous retention cyst within the right maxillary sinus. Electronically Signed   By: Rockey Childs D.O.   On: 10/06/2021 13:22  Medications:  ceFEPime  (MAXIPIME ) IV 1 g (10/07/21 1440)    vitamin C  500 mg Oral Daily   aspirin  EC  81 mg Oral Daily   atorvastatin   40 mg Oral QHS   buPROPion   300 mg Oral Daily   calcitRIOL   2 mcg Oral Q M,W,F-HD   Chlorhexidine  Gluconate Cloth  6 each Topical Q0600   ferric citrate   630 mg Oral TID WC   heparin  injection (subcutaneous)  5,000 Units Subcutaneous Q8H   metoprolol  tartrate  25 mg Oral BID   midodrine   10 mg Oral Q M,W,F-HD   pantoprazole   40 mg Oral QHS   ramelteon   8 mg Oral QHS   sucroferric oxyhydroxide  1,000 mg Oral TID WC   tamsulosin   0.4 mg Oral QHS     OP HD: MWF [facility]  5h  400/500  99.5kg   2/2 bath   LUA AVF  Heparin  none   - no esa/ IV fe  - rocaltrol  2.0 tiw po     Assessment/ Plan: Infection/Nonhealing wound  R ankle - IV vanc/ cefepime , per primary. VVS consulted. Plans for LE arteriogram/possible intervention 10/09/2021.  Fall with near syncope: New 13mm infarct on CT. Possible artifact but W/U per primary/neurolgy.  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 Cardiology consulted. No evidence of excess volume by exam. Optimize  volume with HD. H/O high IDWG. Discussed compliance with fluid restrictions.  ESRD - on HD MWF. HD today on schedule. Next HD 10/10/2021. HTN/ vol - BP's wnl, at dry wt.  H/o L BKA H/O severe calciphylaxis  Anemia ckd - HGB 13.8.  No esa needs for now  MBD ckd - CCa in range.  Cont binder, vdra     Edina Winningham H. Hassell Patras NP-C 10/08/2021, 12:00 PM  [employer] [phone]

## 2021-10-08 NOTE — Progress Notes (Addendum)
?Hospital Consult ? ? ? ?Reason for Consult: Nonhealing right ankle wound ?Requesting Physician:  Dr. Sharol Given ?MRN #:  [id number] ? ?History of Present Illness: This is a 51 y.o. male admitted to the hospital with fall and episode of syncope.  Work-up included echocardiogram demonstrating a decrease in ejection fraction to 40%.  Plans are noted for left heart cath today.  MRI brain also demonstrated possible acute infarcts.  He is being seen in consultation for evaluation of nonhealing right ankle wound.  He has noncompressible tibial vessels and TBI of 0.5.  He has history of left below the knee amputation.  He denies significant pain to the right leg or wound.  Past medical history also significant for insulin-dependent diabetes mellitus and end-stage renal disease. ? ?Past Medical History:  ?Diagnosis Date  ? Anemia   ? Anemia of chronic disease   ? Anxiety   ? ARF (acute renal failure) ([place]) 01/30/2019  ? Calciphylaxis   ? Controlled type 2 diabetes mellitus with hyperglycemia, without long-term current use of insulin ([place])   ? Debility 02/09/2019  ? Depression   ? Diabetes mellitus type 2 in obese ([place]) 07/04/2013  ? Diabetes mellitus with peripheral vascular disease ([place])   ? type 2 no meds, lost 100 lbs  ? Dyslipidemia   ? Dyspnea   ? inhaler  ? End stage renal disease ([place])   ? ESRD (end stage renal disease) on dialysis ([place]) 01/2019  ? MWFS  ? ESRD on dialysis [name])   ? Essential hypertension   ? Fluid overload 02/04/2019  ? GERD (gastroesophageal reflux disease)   ? Goals of care, counseling/discussion   ? Habitual alcohol use 07/04/2013  ? Headache   ? Hyperkalemia 01/30/2019  ? Hypertension   ? Hypoglycemia 01/30/2019  ? Hypokalemia 01/30/2019  ? Labile blood pressure   ? Leukocytosis   ? Lower GI bleed 05/14/2019  ? Macrocytic anemia 01/30/2019  ? Obesity, Class II, BMI 35-39.9, with comorbidity 07/04/2013  ? Palliative care encounter   ? PDR (proliferative diabetic retinopathy) ([place]) 12/14/2013  ? Physical  deconditioning   ? Pressure injury of skin 04/27/2019  ? Pruritus   ? Scrotal edema   ? Scrotal pain   ? Sleep apnea   ? uses CPAP  ? Sleep disturbance   ? Slow transit constipation   ? Status post below-knee amputation of left lower extremity ([place])   ? Stroke [facility])   ? mini -shown on CT scan  ? Syphilis 01/2019  ? Urinary retention   ? Venous hypertension 09/22/2013  ? Wound infection 03/28/2019  ? ? ?Past Surgical History:  ?Procedure Laterality Date  ? AV FISTULA PLACEMENT Left 01/09/2019  ? Procedure: ARTERIOVENOUS (AV) FISTULA CREATION LEFT UPPER ARM;  Surgeon: [name], MD;  Location: [place];  Service: Vascular;  Laterality: Left;  ? BASCILIC VEIN TRANSPOSITION Left 07/06/2019  ? Procedure: BASILIC VEIN TRANSPOSITION SECOND STAGE LEFT ARM;  Surgeon: [name], MD;  [facility];  Service: Vascular;  Laterality: Left;  ? below the knee amputation Left   ? EYE SURGERY Bilateral   ? FLEXIBLE SIGMOIDOSCOPY N/A 05/15/2019  ? Procedure: FLEXIBLE SIGMOIDOSCOPY;  Surgeon: Irving Copas., MD;  Location: [place];  Service: Gastroenterology;  Laterality: N/A;  ? HEMOSTASIS CLIP PLACEMENT  05/15/2019  ? Procedure: HEMOSTASIS CLIP PLACEMENT;  Surgeon: Irving Copas., MD;  Location: [place];  Service: Gastroenterology;;  ? HOT HEMOSTASIS N/A 05/15/2019  ? Procedure: HOT HEMOSTASIS (ARGON PLASMA COAGULATION/BICAP);  Surgeon: Irving Copas., MD;  Location: [place];  Service: Gastroenterology;  Laterality: N/A;  ? IR FLUORO GUIDE CV LINE RIGHT  01/31/2019  ? IR FLUORO GUIDE CV LINE RIGHT  02/03/2019  ? IR THORACENTESIS ASP PLEURAL SPACE W/IMG GUIDE  11/08/2019  ? IR US GUIDE VASC ACCESS RIGHT  01/31/2019  ? IR US GUIDE VASC ACCESS RIGHT  02/03/2019  ? SCLEROTHERAPY  05/15/2019  ? Procedure: SCLEROTHERAPY;  Surgeon: Mansouraty, Telford Nab., MD;  Location: [place];  Service: Gastroenterology;;  ? ? ?Allergies  ?Allergen Reactions  ? Tape Itching  ?  Prefers silk tape over regular  tape  ? ? ?Prior to Admission medications   ?Medication Sig Start Date End Date Taking? Authorizing Provider  ?acetaminophen (TYLENOL) 500 MG tablet Take 1,000 mg by mouth every 6 (six) hours as needed for mild pain.   Yes [provider]  ?albuterol (VENTOLIN HFA) 108 (90 Base) MCG/ACT inhaler Inhale 2 puffs into the lungs every 6 (six) hours as needed for wheezing or shortness of breath.   Yes [provider]  ?amLODipine (NORVASC) 5 MG tablet Take 5 mg by mouth at bedtime.    Yes [provider]  ?atorvastatin (LIPITOR) 40 MG tablet Take 1 tablet (40 mg total) by mouth daily. ?Patient taking differently: Take 40 mg by mouth at bedtime. 03/13/19  Yes Angiulli, Lavon Paganini, PA-C  ?augmented betamethasone dipropionate (DIPROLENE-AF) 0.05 % cream Apply 1 application. topically daily. 09/18/21  Yes [provider]  ?buPROPion (WELLBUTRIN XL) 300 MG 24 hr tablet Take 300 mg by mouth at bedtime.  09/27/19  Yes [provider]  ?clindamycin (CLEOCIN) 150 MG capsule Take 450 mg by mouth 3 (three) times daily. 5 day course. On day 4 10/02/21  Yes [provider]  ?diphenhydrAMINE (BENADRYL) 25 MG tablet Take 50 mg by mouth 2 (two) times daily as needed for itching or sleep.   Yes [provider]  ?diphenhydramine-acetaminophen (TYLENOL PM) 25-500 MG TABS tablet Take 1 tablet by mouth See admin instructions. 2 tablets the morning of dialysis and 2 tablets 1 hour before dialysis   Yes [provider]  ?ferric citrate (AURYXIA) 1 GM 210 MG(Fe) tablet Take 630 mg by mouth 3 (three) times daily with meals. 03/12/21  Yes [provider]  ?ipratropium-albuterol (DUONEB) 0.5-2.5 (3) MG/3ML SOLN Take 3 mLs by nebulization every 6 (six) hours as needed (For shortness of breath).   Yes [provider]  ?Melatonin 10 MG TABS Take 10 mg by mouth at bedtime.   Yes [provider]  ?midodrine (PROAMATINE) 10 MG tablet Take 10 mg by mouth See admin  instructions. Takes 10mg  on days with dialysis 09/17/20  Yes [provider]  ?mupirocin ointment (BACTROBAN) 2 % Apply to right lower extremity once daily. 05/22/21  Yes Marzetta Board, DPM  ?ondansetron (ZOFRAN) 4 MG tablet Take 4 mg by mouth daily as needed for vomiting. 01/01/21  Yes [provider]  ?oxycodone (OXY-IR) 5 MG capsule Take 1 capsule (5 mg total) by mouth every 6 (six) hours as needed. 07/06/19  Yes Dagoberto Ligas, PA-C  ?pantoprazole (PROTONIX) 40 MG tablet Take 1 tablet (40 mg total) by mouth daily. ?Patient taking differently: Take 40 mg by mouth at bedtime. 03/13/19  Yes Angiulli, Lavon Paganini, PA-C  ?Pseudoeph-Doxylamine-DM-APAP (NYQUIL PO) Take 15 mLs by mouth 2 (two) times daily as needed (cough/fever).   Yes [provider]  ?sucroferric oxyhydroxide (VELPHORO) 500 MG chewable tablet Chew 1,000  mg by mouth 3 (three) times daily with meals.   Yes [provider]  ?tamsulosin (FLOMAX) 0.4 MG CAPS capsule Take 1 capsule (0.4 mg total) by mouth daily after breakfast. ?Patient taking differently: Take 0.4 mg by mouth at bedtime. 03/13/19  Yes Angiulli, Lavon Paganini, PA-C  ?traMADol (ULTRAM) 50 MG tablet Take 1 tablet (50 mg total) by mouth every 8 (eight) hours as needed for moderate pain. ?Patient taking differently: Take 50 mg by mouth every 6 (six) hours as needed for moderate pain. 05/18/19  Yes Mercy Riding, MD  ?vitamin C (ASCORBIC ACID) 500 MG tablet Take 500 mg by mouth daily.   Yes [provider]  ?bromocriptine (PARLODEL) 2.5 MG tablet Take 1 tablet (2.5 mg total) by mouth at bedtime. ?Patient not taking: Reported on 10/06/2021 12/07/20   Renato Shin, MD  ?diclofenac Sodium (VOLTAREN) 1 % GEL Apply 2 g topically 4 (four) times daily. ?Patient not taking: Reported on 10/06/2021 10/12/19   Geradine Girt, DO  ?DULoxetine (CYMBALTA) 20 MG capsule Take 20 mg by mouth 2 (two) times daily. ?Patient not taking: Reported on 10/06/2021 03/18/21   [provider]  ?Hydrocortisone (GERHARDT'S BUTT CREAM) CREA Apply 1 application topically 3 (three) times daily as needed for irritation. ?Patient not taking: Reported on 10/06/2021 04/27/19   Dessa Phi, DO

## 2021-10-08 NOTE — Progress Notes (Signed)
FPTS Brief Progress Note ? ?S: Patient seen resting comfortably in recliner.  Patient had no acute concerns at this time. Night RN had no concerns at this time.  ? ? ?O: ?BP (!) 119/100 (BP Location: Right Wrist)   Pulse 85   Temp 98 ?F (36.7 ?C) (Oral)   Resp 17   Ht 5\' 10"  (1.778 m)   Wt 97.3 kg   SpO2 100%   BMI 30.78 kg/m?   ? ? ?A/P: ?-Plan per day team and multiple consultants including DAPT after all procedures per stroke team, outpatient appointment per cardiology, HD per nephro, possible transtibial amputation Friday presuming no revascularization options per orthopedics, and CT angio ab/pelvis w/ b/l runoff to assess for revascularization options per VVS. ?- Orders reviewed. Labs for AM ordered, which was adjusted as needed.  ?- If condition changes, plan includes bedside eval.  ? ?France Ravens, MD ?10/08/2021, 9:36 PM ?PGY-1, [facility] Night Resident  ?Please page [phone] with questions.  ? ?

## 2021-10-08 NOTE — Progress Notes (Addendum)
? ?Progress Note ? ?Patient Name: Michael Riggs ?Date of Encounter: 10/08/2021 ? ?Primary Cardiologist: Janina Mayo, MD (new this adm) ? ?Subjective  ?Pt feels well ? ?Had mild dizziness  ?No syncope ?Initial findings suggest he may have had a stroke. ?Work up in progress ? ?Echo found EF to be 40%. ? ?No CP  ?Was set up for cath but the cath has been cancelled pending work up of his neuro issues  ? ?He has ESRD,    ?BP is typically well controlled.  ? ? ?Inpatient Medications  ?  ?Scheduled Meds: ? vitamin C  500 mg Oral Daily  ? aspirin EC  81 mg Oral Daily  ? atorvastatin  40 mg Oral QHS  ? buPROPion  300 mg Oral Daily  ? calcitRIOL  2 mcg Oral Q M,W,F-HD  ? Chlorhexidine Gluconate Cloth  6 each Topical Q0600  ? ferric citrate  630 mg Oral TID WC  ? heparin injection (subcutaneous)  5,000 Units Subcutaneous Q8H  ? metoprolol tartrate  25 mg Oral BID  ? midodrine  10 mg Oral Q M,W,F-HD  ? pantoprazole  40 mg Oral QHS  ? ramelteon  8 mg Oral QHS  ? sucroferric oxyhydroxide  1,000 mg Oral TID WC  ? tamsulosin  0.4 mg Oral QHS  ? ?Continuous Infusions: ? ceFEPime (MAXIPIME) IV 1 g (10/07/21 1440)  ? ?PRN Meds: ?acetaminophen, albuterol, ipratropium-albuterol, oxyCODONE  ? ?Vital Signs  ?  ?Vitals:  ? 10/07/21 2050 10/07/21 2155 10/08/21 0436 10/08/21 0739  ?BP:  138/82 137/84 130/81  ?Pulse: 90 96 72 77  ?Resp: 16 17 17 16   ?Temp:  98.3 ?F (36.8 ?C) 97.8 ?F (36.6 ?C) 97.6 ?F (36.4 ?C)  ?TempSrc:  Oral Axillary Oral  ?SpO2: 97% 97% 99% 96%  ?Weight:      ?Height:      ? ? ?Intake/Output Summary (Last 24 hours) at 10/08/2021 1119 ?Last data filed at 10/07/2021 2330 ?Gross per 24 hour  ?Intake 1040 ml  ?Output 3000 ml  ?Net -1960 ml  ? ? ?  10/07/2021  ?  7:30 PM 10/07/2021  ?  4:00 PM 10/06/2021  ? 12:14 PM  ?Last 3 Weights  ?Weight (lbs) 222 lb 7.1 oz 229 lb 0.9 oz 220 lb  ?Weight (kg) 100.9 kg 103.9 kg 99.791 kg  ?  ? ?Telemetry  ?  ?NSR - Personally Reviewed ? ?Physical Exam  ? ?Physical Exam: ?Blood pressure  130/81, pulse 77, temperature 97.6 ?F (36.4 ?C), temperature source Oral, resp. rate 16, height 5\' 10"  (1.778 m), weight 100.9 kg, SpO2 96 %. ? ?GEN:  middle age male,  NAD , pleasant  ?HEENT: Normal ?NECK: No JVD; No carotid bruits ?LYMPHATICS: No lymphadenopathy ?CARDIAC: RRR , no murmurs, rubs, gallops ?RESPIRATORY:  Clear to auscultation without rales, wheezing or rhonchi  ?ABDOMEN: Soft, non-tender, non-distended ?MUSCULOSKELETAL:  s/p L BKA  ?SKIN: Warm and dry ?NEUROLOGIC:  Alert and oriented x 3 ? ? ?Labs  ?  ?High Sensitivity Troponin:  No results for input(s): TROPONINIHS in the last 720 hours.   ? ?Cardiac EnzymesNo results for input(s): TROPONINI in the last 168 hours. No results for input(s): TROPIPOC in the last 168 hours.  ? ?Chemistry ?Recent Labs  ?Lab 10/06/21 ?1243 10/07/21 ?0645 10/08/21 ?0130  ?NA 133* 130* 132*  ?K 4.7 4.8 4.6  ?CL 91* 87* 93*  ?CO2 29 24 23   ?GLUCOSE 118* 123* 134*  ?BUN 45* 58* 42*  ?CREATININE 8.17* 9.62*  8.03*  ?CALCIUM 9.0 9.5 9.5  ?PROT 7.3  --   --   ?ALBUMIN 3.2*  --   --   ?AST 19  --   --   ?ALT 15  --   --   ?ALKPHOS 92  --   --   ?BILITOT 1.5*  --   --   ?GFRNONAA 7* 6* 8*  ?ANIONGAP 13 19* 16*  ?  ? ?Hematology ?Recent Labs  ?Lab 10/06/21 ?1243 10/07/21 ?0645 10/08/21 ?0130  ?WBC 11.9* 11.7* 10.4  ?RBC 4.08* 3.95* 4.18*  ?HGB 13.2 13.1 13.8  ?HCT 42.1 40.0 42.7  ?MCV 103.2* 101.3* 102.2*  ?MCH 32.4 33.2 33.0  ?MCHC 31.4 32.8 32.3  ?RDW 16.3* 16.2* 16.5*  ?PLT 202 197 204  ? ? ?BNPNo results for input(s): BNP, PROBNP in the last 168 hours.  ? ?DDimer No results for input(s): DDIMER in the last 168 hours.  ? ?Radiology  ?  ?DG Chest 1 View ? ?Result Date: 10/06/2021 ?CLINICAL DATA:  Dialysis patient.  Right foot wound EXAM: CHEST  1 VIEW COMPARISON:  11/27/2020 FINDINGS: Artifact overlies the chest. The right hemithorax is clear. There is cardiomegaly. There is increased density in the left lower chest that could be due to a combination of pleural fluid, volume loss  and pneumonia. Similar appearance to the study 2022. IMPRESSION: Abnormal density in the left lower chest that could be due to a combination of pleural fluid, volume loss and pneumonia. Electronically Signed   By: Nelson Chimes M.D.   On: 10/06/2021 13:16  ? ?DG Ankle Complete Right ? ?Result Date: 10/06/2021 ?CLINICAL DATA:  Right foot wound, fall, initial encounter. EXAM: RIGHT ANKLE - COMPLETE 3+ VIEW COMPARISON:  Right tibia fibula 03/28/2019. FINDINGS: Possible nondisplaced lucency in the anteromedial distal tibial epiphysis, seen on two views. Ankle mortise is intact. Vascular calcifications. IMPRESSION: Possible nondisplaced distal tibial fracture. Electronically Signed   By: Lorin Picket M.D.   On: 10/06/2021 13:17  ? ?CT Head Wo Contrast ? ?Result Date: 10/06/2021 ?CLINICAL DATA:  Head trauma, intracranial venous injury suspected. Facial trauma, blunt. EXAM: CT HEAD WITHOUT CONTRAST CT MAXILLOFACIAL WITHOUT CONTRAST TECHNIQUE: Multidetector CT imaging of the head and maxillofacial structures were performed using the standard protocol without intravenous contrast. Multiplanar CT image reconstructions of the maxillofacial structures were also generated. RADIATION DOSE REDUCTION: This exam was performed according to the departmental dose-optimization program which includes automated exposure control, adjustment of the mA and/or kV according to patient size and/or use of iterative reconstruction technique. COMPARISON:  Head CT 08/30/2021. FINDINGS: CT HEAD FINDINGS Brain: Mild generalized cerebral atrophy. Redemonstrated chronic lacunar infarcts within the left basal ganglia, anterior limb of left internal capsule and left pons. Background mild patchy and ill-defined hypoattenuation within the cerebral white matter, nonspecific but compatible chronic small vessel ischemic disease. There is a 13 mm infarct within the ventral and central pons which is new as compared to the head CT of 08/30/2021. Background  chronic small vessel ischemic changes are also present within the pons. There is no acute intracranial hemorrhage. No demarcated cortical infarct. No extra-axial fluid collection. No evidence of an intracranial mass. No midline shift. Vascular: No hyperdense vessel.  Atherosclerotic calcifications. Skull: Normal. Negative for fracture or focal lesion. CT MAXILLOFACIAL FINDINGS Osseous: No acute maxillofacial fracture is identified. Orbits: No acute or significant orbital finding. Sinuses: Small mucous retention cyst within the right maxillary sinus. Soft tissues: Left periorbital soft tissue swelling is questioned. IMPRESSION: CT head: 1. 13  mm infarct within the ventral and central pons. This appears new from the prior head CT of 08/30/2021 and may be acute or subacute. A brain MRI is recommended for further evaluation. 2. No acute post-traumatic intracranial findings. 3. Redemonstrated chronic lacunar infarcts within the left basal ganglia/internal capsule and left pons. Background cerebral white matter and pontine chronic small vessel ischemic disease. 4. Mild generalized cerebral atrophy. CT maxillofacial: 1. No evidence of acute maxillofacial fracture. 2. Left periorbital soft tissue swelling is questioned. 3. Small mucous retention cyst within the right maxillary sinus. Electronically Signed   By: Kellie Simmering D.O.   On: 10/06/2021 13:22  ? ?MR ANGIO HEAD WO CONTRAST ? ?Result Date: 10/08/2021 ?CLINICAL DATA:  Stroke follow-up EXAM: MRA HEAD WITHOUT CONTRAST TECHNIQUE: Angiographic images of the Circle of Willis were acquired using MRA technique without intravenous contrast. COMPARISON:  Brain MRI 2 days ago FINDINGS: Anterior circulation: Smaller right ICA, at least partially developmental given fetal type left PCA flow and a hypoplastic right A1 segment. Tandem high-grade narrowings of the right ICA at the anterior genu and paraclinoid segments. 1 mm inferiorly pointing infundibulum or aneurysm at the left  supraclinoid ICA. No branch occlusion, beading, or vascular malformation Posterior circulation: Diminutive flow in the left vertebral artery. The right vertebral and basilar arteries are diffusely patent. No

## 2021-10-08 NOTE — Progress Notes (Signed)
Carotid duplex bilateral study completed.   Please see CV Proc for preliminary results.   Laterrian Hevener, RDMS, RVT  

## 2021-10-08 NOTE — Progress Notes (Addendum)
STROKE TEAM PROGRESS NOTE  ? ?ATTENDING NOTE: ?I reviewed above note and agree with the assessment and plan. Pt was seen and examined.  ? ?51 year old male with history of ESRD on hemodialysis, hypertension, hyperlipidemia, diabetes, left BKA, stroke admitted for presyncope causing falls, right lower extremity healing wounds with fracture.  CT showed chronic left BG, inner capsule and left pontine infarct.  MRI showed 2 punctate DWI abnormality within the left ventral pons which could be infarct or artifact.  Punctate DWI hyperintense signal within the right parietal occipital cortex which could be infarct versus artifact.  However, given his numerous risk factor for stroke, continued stroke work-up with MRA head showed tendon high-grade stenosis right ICA intracranially.  Carotid Doppler unremarkable.  EF 40%, down from 55 to 60% in 03/2019.  LDL 33, A1c 6.2, WBC 11.9-10.4.   ? ?On exam, patient sitting in chair, awake alert, orientated x3, no aphasia, mild dysarthria, follows simple commands, able to name and repeat.  No gaze palsy, visual fields full, mild right nasolabial fold flattening.  Bilateral upper extremity equal strengths however left upper extremity dysmetria.  Left BKA, right foot and ankle in dressing but 5/5 strength proximally. ? ?If there are were strokes on MRI, the etiology for patient stroke likely due to small vessel disease given location and multiple risk factors.  Recommend aggressive risk factor modification.  Continue aspirin 81, may consider DAPT if no further procedure planned.  Currently, cardiology plan for left heart cath but canceled for now.  Per primary team, endocarditis unlikely at this time.  No TEE needed at this time from neuro standpoint.  Vascular surgery also involved and considering angioplasty of the right lower extremity vasculature.  Orthopedics also on board pending further procedure regarding right ankle fracture and unhealing wound.  Continue Lipitor 40 and  antibiotics.  Avoid low BP.  Further medical management per primary team. ? ?For detailed assessment and plan, please refer to above as I have made changes wherever appropriate.  ? ?Neurology will sign off. Please call with questions. Pt will follow up with stroke clinic NP at [facility] in about 4 weeks. Thanks for the consult. ? ?Rosalin Hawking, MD PhD ?Stroke Neurology ?10/08/2021 ?6:34 PM ? ? ? ?INTERVAL HISTORY ?Did have a stroke years ago, only residual he noticed was some memory difficulties and difficulty with translating [place].  He was never started on any antiplatelet therapy that he knows of and he states he was unaware he had a stroke until a family member told him after.  Echocardiogram showed an EF of 40%, cardiology consulted.  Decrease left ventricular function.  Vascular surgery planning to do arteriogram/possible intervention.  Nephrology on board patient received dialysis yesterday and will receive dialysis today.  Patient is currently n.p.o. for pending procedures ? ?Vitals:  ? 10/07/21 2050 10/07/21 2155 10/08/21 0436 10/08/21 0739  ?BP:  138/82 137/84 130/81  ?Pulse: 90 96 72 77  ?Resp: 16 17 17 16   ?Temp:  98.3 ?F (36.8 ?C) 97.8 ?F (36.6 ?C) 97.6 ?F (36.4 ?C)  ?TempSrc:  Oral Axillary Oral  ?SpO2: 97% 97% 99% 96%  ?Weight:      ?Height:      ? ?CBC:  ?Recent Labs  ?Lab 10/07/21 ?0645 10/08/21 ?0130  ?WBC 11.7* 10.4  ?NEUTROABS 9.9* 7.8*  ?HGB 13.1 13.8  ?HCT 40.0 42.7  ?MCV 101.3* 102.2*  ?PLT 197 204  ? ?Basic Metabolic Panel:  ?Recent Labs  ?Lab 10/07/21 ?0645 10/08/21 ?0130  ?NA 130*  132*  ?K 4.8 4.6  ?CL 87* 93*  ?CO2 24 23  ?GLUCOSE 123* 134*  ?BUN 58* 42*  ?CREATININE 9.62* 8.03*  ?CALCIUM 9.5 9.5  ?MG  --  1.9  ?PHOS  --  4.4  ? ?Lipid Panel:  ?Recent Labs  ?Lab 10/07/21 ?0645  ?CHOL 75  ?TRIG 82  ?HDL 26*  ?CHOLHDL 2.9  ?VLDL 16  ?[place] 33  ? ?HgbA1c:  ?Recent Labs  ?Lab 10/08/21 ?0130  ?HGBA1C 6.2*  ? ?Urine Drug Screen: No results for input(s): LABOPIA, COCAINSCRNUR, LABBENZ,  AMPHETMU, THCU, LABBARB in the last 168 hours.  ?Alcohol Level No results for input(s): ETH in the last 168 hours. ? ?IMAGING past 24 hours ?VAS [place] ABI WITH/WO TBI ? ?Result Date: 10/07/2021 ? LOWER EXTREMITY DOPPLER STUDY Patient Name:  Michael Riggs  Date of Exam:   10/07/2021 Medical Rec #: [id number]              Accession #:    [id number] Date of Birth: 1970/08/02              Patient Gender: M Patient Age:   4 years Exam Location:  [facility] Procedure:      VAS [place] ABI WITH/WO TBI Referring Phys: TITORYA STOVER --------------------------------------------------------------------------------  Indications: Non-healing RLE achilles wound. High Risk Factors: Hypertension, hyperlipidemia, Diabetes, past history of                    smoking, prior CVA. Other Factors: ESRD (HD), calciphylaxis, LLE BKA.  Comparison Study: No previous exams Performing Technologist: Hill, Jody RVT, RDMS  Examination Guidelines: A complete evaluation includes at minimum, Doppler waveform signals and systolic blood pressure reading at the level of bilateral brachial, anterior tibial, and posterior tibial arteries, when vessel segments are accessible. Bilateral testing is considered an integral part of a complete examination. Photoelectric Plethysmograph (PPG) waveforms and toe systolic pressure readings are included as required and additional duplex testing as needed. Limited examinations for reoccurring indications may be performed as noted.  ABI Findings: +---------+------------------+-----+---------+--------+ Right    Rt Pressure (mmHg)IndexWaveform Comment  +---------+------------------+-----+---------+--------+ Brachial 135                    triphasic         +---------+------------------+-----+---------+--------+ PTA                                      >[address]  +---------+------------------+-----+---------+--------+ DP                                       > [address]  +---------+------------------+-----+---------+--------+ Great Toe68                0.50 Abnormal          +---------+------------------+-----+---------+--------+ +--------+------------------+-----+--------+-------+ Left    Lt Pressure (mmHg)IndexWaveformComment +--------+------------------+-----+--------+-------+ Brachial                               DIA     +--------+------------------+-----+--------+-------+ +-------+-----------+-----------+------------+------------+ ABI/TBIToday's ABIToday's TBIPrevious ABIPrevious TBI +-------+-----------+-----------+------------+------------+ Right  [place]         0.50                                +-------+-----------+-----------+------------+------------+  Arterial wall calcification precludes accurate ankle pressures and ABIs.  Summary: Right: Resting right ankle-brachial index indicates noncompressible right lower extremity arteries. The right toe-brachial index is abnormal (mild/moderately decreased).  *See table(s) above for measurements and observations.  Electronically signed by Deitra Mayo MD on 10/07/2021 at 4:18:41 PM.    Final    ? ?PHYSICAL EXAM ? ?Physical Exam  ?Constitutional: Appears well-developed and well-nourished male  ?Cardiovascular: Normal rate and regular rhythm.  ?Respiratory: Effort normal, non-labored breathing ?Skin: Abrasions noted to left face and left lip, bilateral lower extremities have chronic skin lesions. ?Left upper extremity fistula for hemodialysis ? ?Neuro: ?Mental Status: ?Patient is awake, alert, oriented to person, place, month, year, and situation. ?Patient is able to give a clear and coherent history. ?No signs of aphasia or neglect ?Cranial Nerves: ?II: Visual Fields are full. Pupils are equal, round, and reactive to light.   ?III,IV, VI: EOMI without ptosis or diploplia.  ?V: Facial sensation is symmetric to temperature ?VII: Facial movement is symmetric resting and smiling ?VIII: Hearing is  intact to voice ?X: Palate elevates symmetrically ?XI: Shoulder shrug is symmetric. ?XII: Tongue protrudes midline without atrophy or fasciculations.  ?Motor: ?Tone is normal. Bulk is normal. 5/5 strength was present in all four extremities. Left BKA, however proximal and distal strength 5/5 ?Sens

## 2021-10-08 NOTE — Consult Note (Signed)
? ?ORTHOPAEDIC CONSULTATION ? ?REQUESTING PHYSICIAN: Leeanne Rio, MD ? ?Chief Complaint: Necrotic ulcer right Achilles. ? ?HPI: ?Michael Riggs is a 51 y.o. male who presents with end-stage renal disease on dialysis with type 2 diabetes with a history of calciphylaxis with a left transtibial amputation.  Patient states that he placed a fan on his foot and when he woke up he had a blister.  Patient states this wound is a room air fan.  Patient went to the wound center in [place] and states that after debridement he was referred to the hospital for surgical intervention. ? ?Past Medical History:  ?Diagnosis Date  ? Anemia   ? Anemia of chronic disease   ? Anxiety   ? ARF (acute renal failure) ([place]) 01/30/2019  ? Calciphylaxis   ? Controlled type 2 diabetes mellitus with hyperglycemia, without long-term current use of insulin ([place])   ? Debility 02/09/2019  ? Depression   ? Diabetes mellitus type 2 in obese ([place]) 07/04/2013  ? Diabetes mellitus with peripheral vascular disease ([place])   ? type 2 no meds, lost 100 lbs  ? Dyslipidemia   ? Dyspnea   ? inhaler  ? End stage renal disease ([place])   ? ESRD (end stage renal disease) on dialysis ([place]) 01/2019  ? MWFS  ? ESRD on dialysis [facility])   ? Essential hypertension   ? Fluid overload 02/04/2019  ? GERD (gastroesophageal reflux disease)   ? Goals of care, counseling/discussion   ? Habitual alcohol use 07/04/2013  ? Headache   ? Hyperkalemia 01/30/2019  ? Hypertension   ? Hypoglycemia 01/30/2019  ? Hypokalemia 01/30/2019  ? Labile blood pressure   ? Leukocytosis   ? Lower GI bleed 05/14/2019  ? Macrocytic anemia 01/30/2019  ? Obesity, Class II, BMI 35-39.9, with comorbidity 07/04/2013  ? Palliative care encounter   ? PDR (proliferative diabetic retinopathy) ([place]) 12/14/2013  ? Physical deconditioning   ? Pressure injury of skin 04/27/2019  ? Pruritus   ? Scrotal edema   ? Scrotal pain   ? Sleep apnea   ? uses CPAP  ? Sleep disturbance   ? Slow transit constipation   ?  Status post below-knee amputation of left lower extremity ([place])   ? Stroke [facility])   ? mini -shown on CT scan  ? Syphilis 01/2019  ? Urinary retention   ? Venous hypertension 09/22/2013  ? Wound infection 03/28/2019  ? ?Past Surgical History:  ?Procedure Laterality Date  ? AV FISTULA PLACEMENT Left 01/09/2019  ? Procedure: ARTERIOVENOUS (AV) FISTULA CREATION LEFT UPPER ARM;  Surgeon: [name], MD;  Location: [place];  Service: Vascular;  Laterality: Left;  ? BASCILIC VEIN TRANSPOSITION Left 07/06/2019  ? Procedure: BASILIC VEIN TRANSPOSITION SECOND STAGE LEFT ARM;  Surgeon: [name], MD;  Location: [facility];  Service: Vascular;  Laterality: Left;  ? below the knee amputation Left   ? EYE SURGERY Bilateral   ? FLEXIBLE SIGMOIDOSCOPY N/A 05/15/2019  ? Procedure: FLEXIBLE SIGMOIDOSCOPY;  Surgeon: Irving Copas., MD;  Location: [place];  Service: Gastroenterology;  Laterality: N/A;  ? HEMOSTASIS CLIP PLACEMENT  05/15/2019  ? Procedure: HEMOSTASIS CLIP PLACEMENT;  Surgeon: Irving Copas., MD;  Location: [place];  Service: Gastroenterology;;  ? HOT HEMOSTASIS N/A 05/15/2019  ? Procedure: HOT HEMOSTASIS (ARGON PLASMA COAGULATION/BICAP);  Surgeon: Irving Copas., MD;  Location: [place];  Service: Gastroenterology;  Laterality: N/A;  ? IR FLUORO GUIDE CV LINE RIGHT  01/31/2019  ? IR  FLUORO GUIDE CV LINE RIGHT  02/03/2019  ? IR THORACENTESIS ASP PLEURAL SPACE W/IMG GUIDE  11/08/2019  ? IR US GUIDE VASC ACCESS RIGHT  01/31/2019  ? IR US GUIDE VASC ACCESS RIGHT  02/03/2019  ? SCLEROTHERAPY  05/15/2019  ? Procedure: SCLEROTHERAPY;  Surgeon: Mansouraty, Telford Nab., MD;  Location: [place];  Service: Gastroenterology;;  ? ?Social History  ? ?Socioeconomic History  ? Marital status: Divorced  ?  Spouse name: Not on file  ? Number of children: Not on file  ? Years of education: Not on file  ? Highest education level: Not on file  ?Occupational History  ? Not on file  ?Tobacco Use  ? Smoking  status: Former  ?  Types: Cigarettes  ?  Start date: 11/18/2018  ?  Quit date: 01/18/2019  ?  Years since quitting: 2.7  ? Smokeless tobacco: Never  ? Tobacco comments:  ?  pt stated got bored with it  ?Vaping Use  ? Vaping Use: Never used  ?Substance and Sexual Activity  ? Alcohol use: Not Currently  ?  Comment: heavy drinker in the past, none since 07/19/18  ? Drug use: Not Currently  ? Sexual activity: Not Currently  ?Other Topics Concern  ? Not on file  ?Social History Narrative  ? Not on file  ? ?Social Determinants of Health  ? ?Financial Resource Strain: Not on file  ?Food Insecurity: Not on file  ?Transportation Needs: Not on file  ?Physical Activity: Not on file  ?Stress: Not on file  ?Social Connections: Not on file  ? ?Family History  ?Problem Relation Age of Onset  ? Diabetes Mother   ? Multiple myeloma Mother   ? ?- negative except otherwise stated in the family history section ?Allergies  ?Allergen Reactions  ? Tape Itching  ?  Prefers silk tape over regular tape  ? ?Prior to Admission medications   ?Medication Sig Start Date End Date Taking? Authorizing Provider  ?acetaminophen (TYLENOL) 500 MG tablet Take 1,000 mg by mouth every 6 (six) hours as needed for mild pain.   Yes [provider]  ?albuterol (VENTOLIN HFA) 108 (90 Base) MCG/ACT inhaler Inhale 2 puffs into the lungs every 6 (six) hours as needed for wheezing or shortness of breath.   Yes [provider]  ?amLODipine (NORVASC) 5 MG tablet Take 5 mg by mouth at bedtime.    Yes [provider]  ?atorvastatin (LIPITOR) 40 MG tablet Take 1 tablet (40 mg total) by mouth daily. ?Patient taking differently: Take 40 mg by mouth at bedtime. 03/13/19  Yes Angiulli, Lavon Paganini, PA-C  ?augmented betamethasone dipropionate (DIPROLENE-AF) 0.05 % cream Apply 1 application. topically daily. 09/18/21  Yes [provider]  ?buPROPion (WELLBUTRIN XL) 300 MG 24 hr tablet Take 300 mg by mouth at bedtime.  09/27/19  Yes [provider]  ?clindamycin (CLEOCIN) 150 MG capsule Take 450 mg by mouth 3 (three) times daily. 5 day course. On day 4 10/02/21  Yes [provider]  ?diphenhydrAMINE (BENADRYL) 25 MG tablet Take 50 mg by mouth 2 (two) times daily as needed for itching or sleep.   Yes [provider]  ?diphenhydramine-acetaminophen (TYLENOL PM) 25-500 MG TABS tablet Take 1 tablet by mouth See admin instructions. 2 tablets the morning of dialysis and 2 tablets 1 hour before dialysis   Yes [provider]  ?ferric citrate (AURYXIA) 1 GM 210 MG(Fe) tablet Take 630 mg by mouth 3 (three) times daily with meals. 03/12/21  Yes [provider]  ?ipratropium-albuterol (DUONEB) 0.5-2.5 (3) MG/3ML SOLN Take 3 mLs by nebulization every 6 (six) hours as needed (For shortness of breath).   Yes [provider]  ?Melatonin 10 MG TABS Take 10 mg by mouth at bedtime.   Yes [provider]  ?midodrine (PROAMATINE) 10 MG tablet Take 10 mg by mouth See admin instructions. Takes 53m on days with dialysis 09/17/20  Yes [provider]  ?mupirocin ointment (BACTROBAN) 2 % Apply to right lower extremity once daily. 05/22/21  Yes GMarzetta Board DPM  ?ondansetron (ZOFRAN) 4 MG tablet Take 4 mg by mouth daily as needed for vomiting. 01/01/21  Yes [provider]  ?oxycodone (OXY-IR) 5 MG capsule Take 1 capsule (5 mg total) by mouth every 6 (six) hours as needed. 07/06/19  Yes EDagoberto Ligas PA-C  ?pantoprazole (PROTONIX) 40 MG tablet Take 1 tablet (40 mg total) by mouth daily. ?Patient taking differently: Take 40 mg by mouth at bedtime. 03/13/19  Yes Angiulli, DLavon Paganini PA-C  ?Pseudoeph-Doxylamine-DM-APAP (NYQUIL PO) Take 15 mLs by mouth 2 (two) times daily as needed (cough/fever).   Yes [provider]  ?sucroferric oxyhydroxide (VELPHORO) 500 MG chewable tablet Chew 1,000 mg by mouth 3 (three) times daily with meals.   Yes [provider]  ?tamsulosin (FLOMAX) 0.4  MG CAPS capsule Take 1 capsule (0.4 mg total) by mouth daily after breakfast. ?Patient taking differently: Take 0.4 mg by mouth at bedtime. 03/13/19  Yes Angiulli, DLavon Paganini PA-C  ?traMADol (Veatrice [place] 50 MG tablet [place]

## 2021-10-08 NOTE — Progress Notes (Signed)
Troponin only low level, no significant elevation. No need to trend further as first value was not markedly elevated and no CP. Will arrange OP f/u and put appt on chart. ?

## 2021-10-08 NOTE — Evaluation (Signed)
Physical Therapy Evaluation ?Patient Details ?Name: Michael Riggs ?MRN: [id number] ?DOB: 09-Nov-1970 ?Today's Date: 10/08/2021 ? ?History of Present Illness ? 51 y.o presenting to [facility] on [facility]/20 presenting with poorly healing and infectious-appearing R LE wound. He presented after a fall where he hit his head, CT shows new infarct within the ventral and central pons. R ankle XR shows possible nondisplaced distal tib fx, WBAT for transfers. PMH significant of ESRD on HD, HTN, left BKA, HLD, T2DM, anemia of chronic disease and CVA, L BKA.  ?Clinical Impression ? Pt presents with decreased functional mobility, static and dynamic balance, limited gait, and decreased overall strength secondary to diagnosis above. These impairments are limiting his ability to safely and independently transfer, get into his home, perform all adls/iadls, and ambulate in the community. Pt to benefit from acute PT to address deficits. Bed mobility, STS, and a stand pivot transfer was performed while pt. Used a SPC. He has a history of peripheral neuropathy and L BKA at baseline that secondarily limits his balance.  Pt. Responded well but was limited secondary to his functional strength and balance. SPT recommends further evaluation once medical workup is complete for discharge planning, pt seems to be close to baseline at this time. PT to progress mobility as tolerated, and will continue to follow acutely.  ?   ?   ? ?Recommendations for follow up therapy are one component of a multi-disciplinary discharge planning process, led by the attending physician.  Recommendations may be updated based on patient status, additional functional criteria and insurance authorization. ? ?Follow Up Recommendations Other (comment) (Re-evaluate after further medical workup, unsure how far off from baseline.) ? ?  ?Assistance Recommended at Discharge Intermittent Supervision/Assistance  ?Patient can return home with the following ? A little help with walking  and/or transfers;A little help with bathing/dressing/bathroom;Assistance with cooking/housework;Assist for transportation ? ?  ?Equipment Recommendations None recommended by PT  ?Recommendations for Other Services ?    ?  ?Functional Status Assessment Patient has had a recent decline in their functional status and demonstrates the ability to make significant improvements in function in a reasonable and predictable amount of time.  ? ?  ?Precautions / Restrictions Precautions ?Precautions: Fall ?Precaution Comments: L BKA at baseline ?Restrictions ?Weight Bearing Restrictions: No ?RLE Weight Bearing: Weight bearing as tolerated ?Other Position/Activity Restrictions: Per Ellison Hughs PT "Per podiatry - RLE "can weight bear as tolerated for transfers for now"" on 3/21  ? ?  ? ?Mobility ? Bed Mobility ?Overal bed mobility: Needs Assistance ?Bed Mobility: Supine to Sit ?  ?  ?Supine to sit: Supervision ?  ?  ?General bed mobility comments: Supervision for saftey ?  ? ?Transfers ?Overall transfer level: Needs assistance ?Equipment used: Straight cane ?Transfers: Sit to/from Stand, Bed to chair/wheelchair/BSC ?Sit to Stand: Min guard ?Stand pivot transfers: Min guard ?  ?  ?  ?  ?General transfer comment: Min guard for saftey and set up. Pt. shows wide BOS and unable to obstain static balance. Dynamically he was not stable and would require increased support for any further ambulation or transfer. Once standing he perfromed a single pivot and sat down minimally controlled in the chair. ?  ? ?Ambulation/Gait ?  ?  ?  ?  ?  ?  ?  ?  ? ?Stairs ?  ?  ?  ?  ?  ? ?Wheelchair Mobility ?  ? ?Modified Rankin (Stroke Patients Only) ?  ? ?  ? ?Balance Overall balance assessment: Needs assistance ?  Sitting-balance support: Feet supported ?Sitting balance-Leahy Scale: Good ?Sitting balance - Comments: Able to donn prothesis in sitting and maintain balance ?  ?  ?Standing balance-Leahy Scale: Poor ?Standing balance comment: Requires support to  maintain balance, unable to able full static or dynamic standing balance. ?  ?  ?  ?  ?  ?  ?  ?  ?  ?  ?  ?   ? ? ? ?Pertinent Vitals/Pain Pain Assessment ?Pain Assessment: 0-10 ?Pain Score: 8  ?Pain Location: Back ?Pain Descriptors / Indicators: Discomfort, Aching, Grimacing ?Pain Intervention(s): Limited activity within patient's tolerance, Monitored during session  ? ? ?Home Living Family/patient expects to be discharged to:: Private residence ?Living Arrangements: Parent;Other relatives (Mother, brother) ?Available Help at Discharge: Family;Available PRN/intermittently ?Type of Home: Apartment ?Home Access: Level entry ?  ?  ?  ?Home Layout: One level ?Home Equipment: Hand held shower head;Tub bench;Wheelchair - manual;Cane - single point ?Additional Comments: brother also lives with pt and parent  ?  ?Prior Function Prior Level of Function : Independent/Modified Independent ?  ?  ?  ?  ?  ?  ?Mobility Comments: uses w/c in apartment to go to bathroom, prosthetic and cane to go short distance ?ADLs Comments: Drives, mom has a care take of mother, brother mainly cooks but he can if he wants to. Does not work ?  ? ? ?Hand Dominance  ? Dominant Hand: Right ? ?  ?Extremity/Trunk Assessment  ? Upper Extremity Assessment ?Upper Extremity Assessment: Defer to OT evaluation ?  ? ?Lower Extremity Assessment ?Lower Extremity Assessment: RLE deficits/detail;LLE deficits/detail ?RLE Deficits / Details: Generalized weakness ?RLE Sensation: history of peripheral neuropathy ?LLE Deficits / Details: Generalized weakness, BKA ?LLE Sensation: history of peripheral neuropathy ?  ? ?   ?Communication  ? Communication: No difficulties  ?Cognition Arousal/Alertness: Awake/alert ?Behavior During Therapy: [facility] for tasks assessed/performed ?Overall Cognitive Status: Within Functional Limits for tasks assessed ?  ?  ?  ?  ?  ?  ?  ?  ?  ?  ?  ?  ?  ?  ?  ?  ?  ?  ?  ? ?  ?General Comments General comments (skin integrity, edema, etc.):  VSS on RA ? ?  ?Exercises    ? ?Assessment/Plan  ?  ?PT Assessment Patient needs continued PT services  ?PT Problem List Decreased strength;Decreased mobility;Decreased safety awareness;Decreased range of motion;Decreased coordination;Decreased activity tolerance;Decreased balance;Decreased knowledge of use of DME;Impaired sensation ? ?   ?  ?PT Treatment Interventions DME instruction;Therapeutic activities;Therapeutic exercise;Balance training;Patient/family education;Functional mobility training;Neuromuscular re-education   ? ?PT Goals (Current goals can be found in the Care Plan section)  ?Acute Rehab PT Goals ?Patient Stated Goal: To get back to walking and return home. ?PT Goal Formulation: With patient ?Time For Goal Achievement: 10/22/21 ?Potential to Achieve Goals: Good ? ?  ?Frequency Min 3X/week ?  ? ? ?Co-evaluation   ?  ?  ?  ?  ? ? ?  ?AM-PAC PT "6 Clicks" Mobility  ?Outcome Measure Help needed turning from your back to your side while in a flat bed without using bedrails?: None ?Help needed moving from lying on your back to sitting on the side of a flat bed without using bedrails?: A Little ?Help needed moving to and from a bed to a chair (including a wheelchair)?: A Little ?Help needed standing up from a chair using your arms (e.g., wheelchair or bedside chair)?: A Little ?Help needed to walk in  hospital room?: A Lot ?Help needed climbing 3-5 steps with a railing? : A Lot ?6 Click Score: 17 ? ?  ?End of Session Equipment Utilized During Treatment:  (Prosthesis) ?Activity Tolerance: Patient tolerated treatment well ?Patient left: in chair;with call bell/phone within reach;with chair alarm set ?Nurse Communication: Mobility status ?PT Visit Diagnosis: Unsteadiness on feet (R26.81);Other abnormalities of gait and mobility (R26.89);Repeated falls (R29.6) ?  ? ?Time: [phone] ?PT Time Calculation (min) (ACUTE ONLY): 21 min ? ? ?Charges:   PT Evaluation ?$PT Eval Low Complexity: 1 Low ?  ?  ?   ? ? ?Thermon Leyland, SPT ?Acute Rehab Services ? ? ?Thermon Leyland ?10/08/2021, 11:24 AM ? ?

## 2021-10-08 NOTE — Plan of Care (Signed)
  Problem: Education: Goal: Knowledge of General Education information will improve Description: Including pain rating scale, medication(s)/side effects and non-pharmacologic comfort measures Outcome: Progressing   Problem: Clinical Measurements: Goal: Ability to maintain clinical measurements within normal limits will improve Outcome: Progressing   

## 2021-10-08 NOTE — Progress Notes (Signed)
Family Medicine Teaching Service ?Daily Progress Note ?Intern Pager: [phone] ? ?Patient name: Michael Riggs Medical record number: [id number] ?Date of birth: 08-09-1970 Age: 51 y.o. Gender: male ? ?Primary Care Provider: [name], MD ?Consultants: Podiatry (S/O), nephrology, orthopedic surgery, wound care, neurology, cardiology ?Code Status: Full ? ?Pt Overview and Major Events to Date:  ?3/20: admitted ? ?Assessment and Plan: ?Michael Riggs is a 51 y.o. male presenting with fall and near syncope and R leg wound. PMH is significant for ESRD on HD, HTN, left BKA, HLD, T2DM, anemia of chronic disease, history of CVA, OSA. ? ?Infected chronic R leg wound ?ABI was abnormal (mild/moderately decreased). Blood culture showed streptococcus species positive, likely contaminant. WBC and ANC improved from 11.7> 10.4 and 9.9>7.8, respectively.  Patient's only requested pain medications every 8 hours. ?-Orthopedics consulted, appreciate recommendations ?-Vascular surgery consulted, appreciate recommendations ?-Wound care following, appreciate assistance ?-Continue cefepime (3/20-) ?-Follow-up blood cultures ?-A.m. CBC with differential ?-Tylenol 1000 mg every 6 hours as needed, Oxy IR 5 mg every 6 hours as needed ? ?Fall w/ near syncope  new onset HFrEF  HTN: stable ?Blood pressure stable in the 130s over 80s. LHC deferred today until neurology workup complete ?-Cardiology following, appreciate recommendations ?-Hold amlodipine ?-LHC pending ? ?Acute CVA  Hx of CVA ?Neurology recommends stroke work-up given prior strokes and stroke risk factors.  Advised MRA of head, carotid ultrasound and cardiac telemetry. ?-Neurology consulted, appreciate recommendations ?-Cardiac monitoring ?-MRA head pending ?-Continue aspirin 81 mg and atorvastatin 40 mg daily ? ?ESRD on HD MWF ?Received partial dialysis yesterday and will receive dialysis again today. ?-Nephrology following, appreciate recommendations ?-A.m.  BMP ? ?FEN/GI: Heart healthy ?PPx: Heparin subcu every 8 hours ?Dispo: pending clinical improvement . Barriers include medical work-up via cardiology, neurology, orthopedics.  ? ?Subjective:  ?Patient states he is trying to sleep because he does not like the feeling of not being able to eat due to having procedures today. ? ?Objective: ?Temp:  [97.6 ?F (36.4 ?C)-99.6 ?F (37.6 ?C)] 97.8 ?F (36.6 ?C) (03/22 0436) ?Pulse Rate:  [72-98] 72 (03/22 0436) ?Resp:  [13-23] 17 (03/22 0436) ?BP: (110-138)/(65-95) 137/84 (03/22 0436) ?SpO2:  [96 %-100 %] 99 % (03/22 0436) ?Weight:  [100.9 kg-103.9 kg] 100.9 kg (03/21 1930) ?Physical Exam: ?General: Awake, alert, NAD ?Cardiovascular: RRR, no murmurs auscultated ?Respiratory: CTA B, no increased work of breathing ?Extremities: Left BKA, right leg wound wrapped, no pitting edema appreciated ? ?Laboratory: ?Recent Labs  ?Lab 10/06/21 ?1243 10/07/21 ?0645 10/08/21 ?0130  ?WBC 11.9* 11.7* 10.4  ?HGB 13.2 13.1 13.8  ?HCT 42.1 40.0 42.7  ?PLT 202 197 204  ? ?Recent Labs  ?Lab 10/06/21 ?1243 10/07/21 ?0645 10/08/21 ?0130  ?NA 133* 130* 132*  ?K 4.7 4.8 4.6  ?CL 91* 87* 93*  ?CO2 29 24 23   ?BUN 45* 58* 42*  ?CREATININE 8.17* 9.62* 8.03*  ?CALCIUM 9.0 9.5 9.5  ?PROT 7.3  --   --   ?BILITOT 1.5*  --   --   ?ALKPHOS 92  --   --   ?ALT 15  --   --   ?AST 19  --   --   ?GLUCOSE 118* 123* 134*  ? ?Imaging/Diagnostic Tests: ?VAS [place] ABI WITH/WO TBI ? ?Result Date: 10/07/2021 ? LOWER EXTREMITY DOPPLER STUDY Patient Name:  Michael Riggs  Date of Exam:   10/07/2021 Medical Rec #: [id number]              Accession #:  [id number] Date of Birth: 04/12/1971              Patient Gender: M Patient Age:   62 years Exam Location:  [facility] Procedure:      VAS [place] ABI WITH/WO TBI Referring Phys: TITORYA STOVER --------------------------------------------------------------------------------  Indications: Non-healing RLE achilles wound. High Risk Factors: Hypertension, hyperlipidemia,  Diabetes, past history of                    smoking, prior CVA. Other Factors: ESRD (HD), calciphylaxis, LLE BKA.  Comparison Study: No previous exams Performing Technologist: Hill, Jody RVT, RDMS  Examination Guidelines: A complete evaluation includes at minimum, Doppler waveform signals and systolic blood pressure reading at the level of bilateral brachial, anterior tibial, and posterior tibial arteries, when vessel segments are accessible. Bilateral testing is considered an integral part of a complete examination. Photoelectric Plethysmograph (PPG) waveforms and toe systolic pressure readings are included as required and additional duplex testing as needed. Limited examinations for reoccurring indications may be performed as noted.  ABI Findings: +---------+------------------+-----+---------+--------+ Right    Rt Pressure (mmHg)IndexWaveform Comment  +---------+------------------+-----+---------+--------+ Brachial 135                    triphasic         +---------+------------------+-----+---------+--------+ PTA                                      >[address]  +---------+------------------+-----+---------+--------+ DP                                       > [address] +---------+------------------+-----+---------+--------+ Great Toe68                0.50 Abnormal          +---------+------------------+-----+---------+--------+ +--------+------------------+-----+--------+-------+ Left    Lt Pressure (mmHg)IndexWaveformComment +--------+------------------+-----+--------+-------+ Brachial                               DIA     +--------+------------------+-----+--------+-------+ +-------+-----------+-----------+------------+------------+ ABI/TBIToday's ABIToday's TBIPrevious ABIPrevious TBI +-------+-----------+-----------+------------+------------+ Right           0.50                                +-------+-----------+-----------+------------+------------+  Arterial wall calcification precludes accurate ankle pressures and ABIs.  Summary: Right: Resting right ankle-brachial index indicates noncompressible right lower extremity arteries. The right toe-brachial index is abnormal (mild/moderately decreased).  *See table(s) above for measurements and observations.  Electronically signed by Deitra Mayo MD on 10/07/2021 at 4:18:41 PM.    Final    ? ? ?Wells Guiles, DO ?10/08/2021, 7:36 AM ?PGY-1, [place] [facility] ?[place] Intern pager: [phone], text pages welcome ? ?

## 2021-10-09 ENCOUNTER — Inpatient Hospital Stay (HOSPITAL_COMMUNITY): Payer: Medicare Other

## 2021-10-09 DIAGNOSIS — L03115 Cellulitis of right lower limb: Secondary | ICD-10-CM

## 2021-10-09 DIAGNOSIS — E11621 Type 2 diabetes mellitus with foot ulcer: Secondary | ICD-10-CM | POA: Diagnosis not present

## 2021-10-09 DIAGNOSIS — N185 Chronic kidney disease, stage 5: Secondary | ICD-10-CM | POA: Diagnosis not present

## 2021-10-09 LAB — GLUCOSE, CAPILLARY
Glucose-Capillary: 140 mg/dL — ABNORMAL HIGH (ref 70–99)
Glucose-Capillary: 151 mg/dL — ABNORMAL HIGH (ref 70–99)
Glucose-Capillary: 165 mg/dL — ABNORMAL HIGH (ref 70–99)
Glucose-Capillary: 140 mg/dL — ABNORMAL HIGH (ref 70–99)
Glucose-Capillary: 151 mg/dL — ABNORMAL HIGH (ref 70–99)

## 2021-10-09 LAB — CULTURE, BLOOD (ROUTINE X 2): Special Requests: ADEQUATE

## 2021-10-09 LAB — CBC WITH DIFFERENTIAL/PLATELET
Abs Immature Granulocytes: 0.44 10*3/uL — ABNORMAL HIGH (ref 0.00–0.07)
Basophils Absolute: 0.1 10*3/uL (ref 0.0–0.1)
Basophils Relative: 1 %
Eosinophils Absolute: 0.2 10*3/uL (ref 0.0–0.5)
Eosinophils Relative: 1 %
HCT: 42.5 % (ref 39.0–52.0)
Hemoglobin: 13.9 g/dL (ref 13.0–17.0)
Immature Granulocytes: 4 %
Lymphocytes Relative: 8 %
Lymphs Abs: 0.9 10*3/uL (ref 0.7–4.0)
MCH: 33.1 pg (ref 26.0–34.0)
MCHC: 32.7 g/dL (ref 30.0–36.0)
MCV: 101.2 fL — ABNORMAL HIGH (ref 80.0–100.0)
Monocytes Absolute: 1.8 10*3/uL — ABNORMAL HIGH (ref 0.1–1.0)
Monocytes Relative: 16 %
Neutro Abs: 8 10*3/uL — ABNORMAL HIGH (ref 1.7–7.7)
Neutrophils Relative %: 70 %
Platelets: 217 10*3/uL (ref 150–400)
RBC: 4.2 MIL/uL — ABNORMAL LOW (ref 4.22–5.81)
RDW: 16.2 % — ABNORMAL HIGH (ref 11.5–15.5)
WBC: 11.4 10*3/uL — ABNORMAL HIGH (ref 4.0–10.5)
nRBC: 0 % (ref 0.0–0.2)

## 2021-10-09 LAB — BASIC METABOLIC PANEL
Anion gap: 15 (ref 5–15)
BUN: 38 mg/dL — ABNORMAL HIGH (ref 6–20)
CO2: 26 mmol/L (ref 22–32)
Calcium: 9.5 mg/dL (ref 8.9–10.3)
Chloride: 92 mmol/L — ABNORMAL LOW (ref 98–111)
Creatinine, Ser: 6.86 mg/dL — ABNORMAL HIGH (ref 0.61–1.24)
GFR, Estimated: 9 mL/min — ABNORMAL LOW (ref >60)
Glucose, Bld: 132 mg/dL — ABNORMAL HIGH (ref 70–99)
Potassium: 4.3 mmol/L (ref 3.5–5.1)
Sodium: 133 mmol/L — ABNORMAL LOW (ref 135–145)

## 2021-10-09 IMAGING — CT CT ANGIO AOBIFEM WO/W CM
1 of 9 series · 3 of 16 positions shown, 4 images · IV contrast (OMNI 350)
Comparison: None.

CLINICAL DATA: Claudication or leg ischemia

EXAM:
CT ANGIOGRAPHY OF ABDOMINAL AORTA WITH ILIOFEMORAL RUNOFF
TECHNIQUE: Multidetector CT imaging of the abdomen, pelvis and lower
extremities was performed using the standard protocol during bolus
administration of intravenous contrast. Multiplanar CT image
reconstructions and MIPs were obtained to evaluate the vascular
anatomy.

[Series 6: cta runoff (id) · axial · 0.98mm/px · z∈[+280,+1006]mm · 3 of 484 slices shown, 4 images]
[im 121/484  soft-tissue]
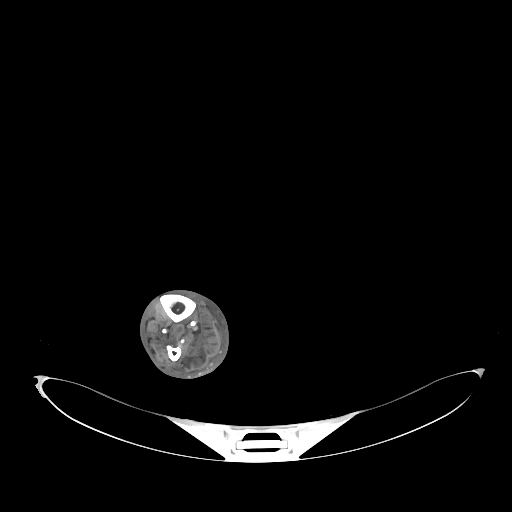
[im 121/484  bone]
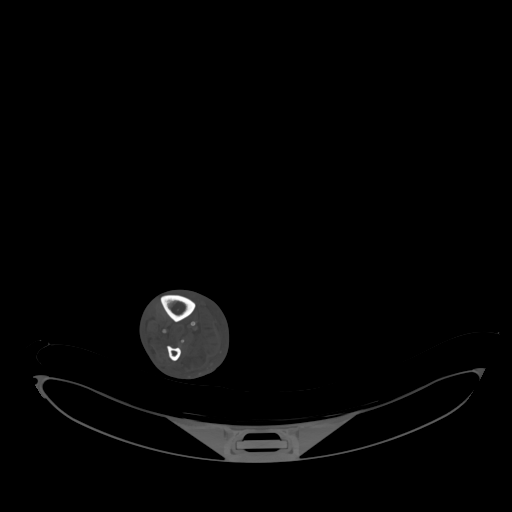
[im 242/484  soft-tissue]
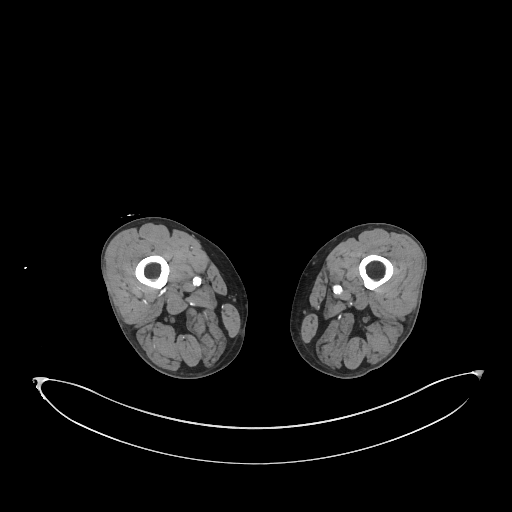
[im 363/484  soft-tissue]
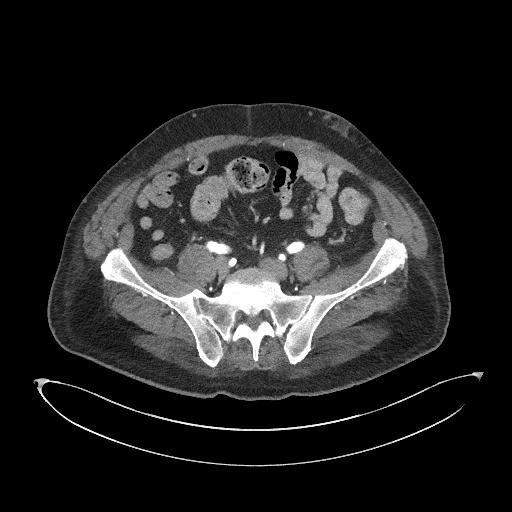

[3 of 16 positions shown; findings below may reference images not displayed]

RADIATION DOSE REDUCTION: This exam was performed according to the
departmental dose-optimization program which includes automated
exposure control, adjustment of the mA and/or kV according to
patient size and/or use of iterative reconstruction technique.

CONTRAST:  125mL OMNIPAQUE IOHEXOL 350 MG/ML SOLN
FINDINGS: VASCULAR

Aorta: Mild atherosclerotic calcification. No hemodynamically
significant stenosis. No aneurysm or dissection. No periaortic
inflammatory change.

Celiac: Patent without evidence of aneurysm, dissection, vasculitis
or significant stenosis.

SMA: Patent without evidence of aneurysm, dissection, vasculitis or
significant stenosis.

Renals: The right renal artery is diffusely narrowed without focal
stenosis identified. No dissection or aneurysm. The left renal
artery is widely patent without evidence of aneurysm or dissection.

IMA: Patent without evidence of aneurysm, dissection, vasculitis or
significant stenosis.

RIGHT Lower Extremity

Inflow: Common, internal and external iliac arteries are patent
without evidence of aneurysm, dissection, vasculitis or significant
stenosis.

Outflow: The common femoral artery and profundus femoral artery are
widely patent. The superficial femoral artery demonstrates extensive
arteriosclerosis with a 50% stenosis in its mid segment, axial image
# 209/6 and a focal 50-75% stenosis at the adductor hiatus.

Runoff: Focal 50% stenosis of the P2 segment of the popliteal
artery, axial image # 289/6. 50% stenosis of the anterior tibial
artery at its origin, axial image # 321/6. 50-75% stenosis of the
tibioperoneal trunk at its origin, axial image # 323/6. Three-vessel
runoff to the ankle. Dorsalis pedis artery and plantar arch are
patent.

LEFT Lower Extremity

Inflow: Common, internal and external iliac arteries are patent
without evidence of aneurysm, dissection, vasculitis or significant
stenosis.

Outflow/runoff: Common femoral artery demonstrates a less than 50%
stenosis. Profundus femoral artery demonstrates a focal 50% stenosis
in its proximal segment, axial image # 183/6. Superficial femoral
artery is occluded just beyond the adductor hiatus. Popliteal artery
is occluded. Gastrocnemius arteries are collateralized from the
profundus femoral artery. Proximal runoff vessels are occluded.
Below-knee amputation has been performed.

Veins: No obvious venous abnormality within the limitations of this
arterial phase study.

Review of the MIP images confirms the above findings.

NON-VASCULAR

Lower chest: Consolidation within the visualized left lower lobe may
reflect changes of rounded atelectasis. Small to moderate left
pleural effusion is present with associated pleural thickening.
Cardiac size is enlarged. Extensive coronary artery calcification.

Hepatobiliary: Cholelithiasis without pericholecystic inflammatory
change. Liver unremarkable.

Pancreas: Unremarkable

Spleen: Unremarkable

Adrenals/Urinary Tract: Adrenal glands are unremarkable. Kidneys are
normal, without renal calculi, focal lesion, or hydronephrosis.
Bladder is unremarkable.

Stomach/Bowel: Moderate stool throughout the colon. The stomach,
small bowel, and large bowel are otherwise unremarkable. Appendix
normal. No free intraperitoneal gas or fluid.

Lymphatic: Shotty retroperitoneal adenopathy is present similar
appearance to remote prior examination [DATE]. No pathologic
adenopathy within the abdomen and pelvis.

Reproductive: Prostate is unremarkable.

Other: No abdominal wall hernia

Musculoskeletal: No acute bone abnormality.
IMPRESSION: VASCULAR

Advanced lower extremity arteriosclerosis.

Serial hemodynamically significant stenoses within the right lower
extremity arterial outflow involving the mid and distal right
superficial femoral artery.

Serial stenoses of the right lower extremity arterial runoff with
hemodynamically significant stenoses of the P2 segment of the
popliteal artery, origin of the anterior tibial artery, and origin
of the tibioperoneal trunk. Preserved three-vessel runoff to the
right ankle with patency of the dorsalis pedis artery and plantar
arch.

Left below-knee amputation. Occlusion of the distal left superficial
femoral artery, popliteal artery, and residual proximal runoff
vasculature. Focal 50% stenosis of the profundus femoral artery
which provides collateral supply to multiple gastrocnemius arteries
of the residual left lower extremity.

NON-VASCULAR

Complex appearing left pleural effusion demonstrating pleural
thickening. Probable rounded atelectasis within the visualized left
lower lobe.

Extensive coronary artery calcification.  Mild cardiomegaly.

Cholelithiasis.

Moderate stool burden without evidence of obstruction.

Aortic Atherosclerosis ([FG]-[FG]).

## 2021-10-09 MED ORDER — CEFEPIME HCL 1 G IJ SOLR
1.0000 g | INTRAMUSCULAR | Status: DC
  Administered 2021-10-09 – 2021-10-12 (×4): 1 g via INTRAVENOUS
  Filled 2021-10-09 (×6): qty 1

## 2021-10-09 MED ORDER — RENA-VITE PO TABS
1.0000 | ORAL_TABLET | Freq: Every day | ORAL | Status: DC
  Administered 2021-10-09 – 2021-10-15 (×7): 1 via ORAL
  Filled 2021-10-09 (×7): qty 1

## 2021-10-09 MED ORDER — IOHEXOL 350 MG/ML SOLN
125.0000 mL | Freq: Once | INTRAVENOUS | Status: AC | PRN
  Administered 2021-10-09: 125 mL via INTRAVENOUS

## 2021-10-09 MED ORDER — PROSOURCE PLUS PO LIQD
30.0000 mL | Freq: Two times a day (BID) | ORAL | Status: DC
  Administered 2021-10-09 – 2021-10-14 (×10): 30 mL via ORAL
  Filled 2021-10-09 (×11): qty 30

## 2021-10-09 NOTE — Progress Notes (Signed)
Pharmacy Antibiotic Note ? ?Michael Riggs is a 51 y.o. male admitted on 10/06/2021 with cellulitis and no evidence of osteomyelitis on imaging. Wound cultures obtained from outpaient wound care center growing morganella morganii, serratia marascens, and MSSA with potential for inducible AmpC beta-lactamase production. Possible intervention on right leg per ortho 3/24.  Pharmacy has been consulted for cefepime dosing.  ? ?3/23: WBC 11.4; afebrile  ? ?Plan: ?Continue Cefepime 1g Q24H ?F/U ortho recs ? ?Height: 5\' 10"  (177.8 cm) ?Weight: 97.3 kg (214 lb 8.1 oz) ?IBW/kg (Calculated) : 73 ? ?Temp (24hrs), Avg:97.9 ?F (36.6 ?C), Min:96.9 ?F (36.1 ?C), Max:98.4 ?F (36.9 ?C) ? ?Recent Labs  ?Lab 10/06/21 ?1243 10/07/21 ?0645 10/08/21 ?0130 10/09/21 ?7048  ?WBC 11.9* 11.7* 10.4 11.4*  ?CREATININE 8.17* 9.62* 8.03* 6.86*  ?LATICACIDVEN 1.1  --   --   --   ? ?  ?Estimated Creatinine Clearance: 15.2 mL/min (A) (by C-G formula based on SCr of 6.86 mg/dL (H)).   ? ?Allergies  ?Allergen Reactions  ? Tape Itching  ?  Prefers silk tape over regular tape  ? ? ?Antimicrobials this admission: ?cefepime 3/20 >>  ?vancomycin 3/20 >> 3/21  ? ?Microbiology results: ?3/20 Bcx: 1/2 strep species (likely contaminant)- reincubated for better growth  ?3/21 Bcx: ngtd  ?3/19 Wound cx (from outpt): morganella morganii (heavy growth), serratia marascens (heavy growth-potential for inducible AmpC), MSSA (moderate growth) ?- all Pan-Susceptible  ? ?Adria Dill, PharmD ?PGY-1 Acute Care Resident  ?10/09/2021 7:57 AM  ? ?  ?

## 2021-10-09 NOTE — Care Management Important Message (Signed)
Important Message ? ?Patient Details  ?Name: Michael Riggs ?MRN: [id number] ?Date of Birth: 02/21/1971 ? ? ?Medicare Important Message Given:  Yes ? ? ? ? ?Levada Dy  Xochil Shanker-Martin ?10/09/2021, 12:04 PM ?

## 2021-10-09 NOTE — Progress Notes (Signed)
[name], male   DOB: 07-07-1971, 51 y.o.   MRN: [id number] ?In reviewing the CT angiogram.  Discussions with vascular vein surgery anticipate patient will have arteriogram study with vascular surgery.  With patient's desire to proceed with limb salvage intervention we will plan for debridement of the Achilles ulcer placement of a wound VAC and biologic tissue graft.  Risk and benefits were discussed including potential for amputation.  Plan for surgery tomorrow on Friday. ?

## 2021-10-09 NOTE — Plan of Care (Signed)
  Problem: Nutrition: Goal: Adequate nutrition will be maintained Outcome: Progressing   Problem: Elimination: Goal: Will not experience complications related to bowel motility Outcome: Progressing   Problem: Safety: Goal: Ability to remain free from injury will improve Outcome: Progressing   

## 2021-10-09 NOTE — Progress Notes (Signed)
Occupational Therapy Treatment ?Patient Details ?Name: Michael Riggs ?MRN: [id number] ?DOB: 1970-10-08 ?Today's Date: 10/09/2021 ? ? ?History of present illness 51 y.o presenting to [facility] on 3/20 presenting with poorly healing and infectious-appearing R LE wound. He presented after a fall where he hit his head, CT shows new infarct within the ventral and central pons. R ankle XR shows possible nondisplaced distal tib fx, WBAT for transfers. PMH significant of ESRD on HD, HTN, left BKA, HLD, T2DM, anemia of chronic disease and CVA, L BKA. ?  ?OT comments ? Pt with concerns about his home situation with his mother and impending surgery tomorrow, but wanted to sit EOB as his back was hurting and he participated in dressing and grooming. Will continue to follow and update discharge recommendations depending on outcome of surgery.   ? ?Recommendations for follow up therapy are one component of a multi-disciplinary discharge planning process, led by the attending physician.  Recommendations may be updated based on patient status, additional functional criteria and insurance authorization. ?   ?Follow Up Recommendations ? Home health OT (pending progress after surgery)  ?  ?Assistance Recommended at Discharge Intermittent Supervision/Assistance  ?Patient can return home with the following ? A little help with walking and/or transfers;A little help with bathing/dressing/bathroom ?  ?Equipment Recommendations ? Other (comment) (to be determined s/p surgery)  ?  ?Recommendations for Other Services   ? ?  ?Precautions / Restrictions Precautions ?Precautions: Fall ?Precaution Comments: L BKA at baseline ?Restrictions ?Weight Bearing Restrictions: Yes ?RLE Weight Bearing: Weight bearing as tolerated ?LLE Weight Bearing: Non weight bearing ?Other Position/Activity Restrictions: Per Ellison Hughs PT "Per podiatry - RLE "can weight bear as tolerated for transfers for now"" on 3/21  ? ? ?  ? ?Mobility Bed Mobility ?Overal bed mobility:  Needs Assistance ?Bed Mobility: Supine to Sit, Sit to Supine ?  ?  ?Supine to sit: Supervision ?Sit to supine: Supervision ?  ?  ?  ? ?Transfers ?  ?  ?  ?  ?  ?  ?  ?  ?  ?General transfer comment: declined OOB, but wanting to sit EOB due to back pain ?  ?  ?Balance Overall balance assessment: Needs assistance ?  ?Sitting balance-Leahy Scale: Good ?  ?  ?  ?  ?  ?  ?  ?  ?  ?  ?  ?  ?  ?  ?  ?  ?   ? ?ADL either performed or assessed with clinical judgement  ? ?ADL Overall ADL's : Needs assistance/impaired ?  ?  ?Grooming: Set up;Sitting ?  ?  ?  ?  ?  ?Upper Body Dressing : Set up;Sitting ?  ?  ?  ?  ?  ?  ?  ?  ?  ?  ?  ?  ? ?Extremity/Trunk Assessment   ?  ?  ?  ?  ?  ? ?Vision   ?  ?  ?Perception   ?  ?Praxis   ?  ? ?Cognition Arousal/Alertness: Awake/alert ?Behavior During Therapy: Anxious ?Overall Cognitive Status: Within Functional Limits for tasks assessed ?  ?  ?  ?  ?  ?  ?  ?  ?  ?  ?  ?  ?  ?  ?  ?  ?General Comments: pt distracted by housing difficulties and inability to care for his mom without outside help ?  ?  ?   ?Exercises   ? ?  ?Shoulder Instructions   ? ? ?  ?  General Comments    ? ? ?Pertinent Vitals/ Pain       Pain Assessment ?Pain Assessment: Faces ?Faces Pain Scale: Hurts little more ?Pain Location: Back ?Pain Descriptors / Indicators: Discomfort, Aching, Grimacing ? ?Home Living   ?  ?  ?  ?  ?  ?  ?  ?  ?  ?  ?  ?  ?  ?  ?  ?  ?  ?  ? ?  ?Prior Functioning/Environment    ?  ?  ?  ?   ? ?Frequency ? Min 2X/week  ? ? ? ? ?  ?Progress Toward Goals ? ?OT Goals(current goals can now be found in the care plan section) ? Progress towards OT goals: Progressing toward goals ? ?Acute Rehab OT Goals ?OT Goal Formulation: With patient ?Time For Goal Achievement: 10/21/21 ?Potential to Achieve Goals: Good  ?Plan Discharge plan remains appropriate   ? ?Co-evaluation ? ? ?   ?  ?  ?  ?  ? ?  ?AM-PAC OT "6 Clicks" Daily Activity     ?Outcome Measure ? ? Help from another person eating meals?: None ?Help  from another person taking care of personal grooming?: A Little ?Help from another person toileting, which includes using toliet, bedpan, or urinal?: A Little ?Help from another person bathing (including washing, rinsing, drying)?: A Lot ?Help from another person to put on and taking off regular upper body clothing?: A Little ?Help from another person to put on and taking off regular lower body clothing?: A Lot ?6 Click Score: 17 ? ?  ?End of Session   ? ?OT Visit Diagnosis: Unsteadiness on feet (R26.81);Other abnormalities of gait and mobility (R26.89);Muscle weakness (generalized) (M62.81) ?  ?Activity Tolerance Patient tolerated treatment well ?  ?Patient Left in bed;with call bell/phone within reach;with bed alarm set ?  ?Nurse Communication   ?  ? ?   ? ?Time: 9038-3338 ?OT Time Calculation (min): 17 min ? ?Charges: OT General Charges ?$OT Visit: 1 Visit ?OT Treatments ?$Self Care/Home Management : 8-22 mins ? ?Nestor Lewandowsky, OTR/L ?Acute Rehabilitation Services ?Pager: [phone] ?Office: [phone]  ? ?Michael Riggs ?10/09/2021, 4:22 PM ?

## 2021-10-09 NOTE — Progress Notes (Addendum)
?  Progress Note ? ? ? ?10/09/2021 ?9:11 AM ?* No surgery date entered * ? ?Subjective:  feeling better this morning ? ? ?Vitals:  ? 10/09/21 0557 10/09/21 0810  ?BP: 124/83 (!) 145/80  ?Pulse: 71 76  ?Resp: 17 18  ?Temp: 98.4 ?F (36.9 ?C) 98 ?F (36.7 ?C)  ?SpO2: 92% 97%  ? ?Physical Exam: ?Lungs:  non labored ?Extremities:  palpable R DP pulse ?Neurologic: A&O ? ?CBC ?   ?Component Value Date/Time  ? WBC 11.4 (H) 10/09/2021 0545  ? RBC 4.20 (L) 10/09/2021 0545  ? HGB 13.9 10/09/2021 0545  ? HCT 42.5 10/09/2021 0545  ? PLT 217 10/09/2021 0545  ? MCV 101.2 (H) 10/09/2021 0545  ? MCH 33.1 10/09/2021 0545  ? MCHC 32.7 10/09/2021 0545  ? RDW 16.2 (H) 10/09/2021 0545  ? LYMPHSABS 0.9 10/09/2021 0545  ? MONOABS 1.8 (H) 10/09/2021 0545  ? EOSABS 0.2 10/09/2021 0545  ? BASOSABS 0.1 10/09/2021 0545  ? ? ?BMET ?   ?Component Value Date/Time  ? NA 133 (L) 10/09/2021 0545  ? K 4.3 10/09/2021 0545  ? CL 92 (L) 10/09/2021 0545  ? CO2 26 10/09/2021 0545  ? GLUCOSE 132 (H) 10/09/2021 0545  ? BUN 38 (H) 10/09/2021 0545  ? CREATININE 6.86 (H) 10/09/2021 0545  ? CALCIUM 9.5 10/09/2021 0545  ? GFRNONAA 9 (L) 10/09/2021 0545  ? GFRAA 8 (L) 11/10/2019 1200  ? ? ?INR ?   ?Component Value Date/Time  ? INR 1.6 (H) 04/13/2019 0520  ? ? ? ?Intake/Output Summary (Last 24 hours) at 10/09/2021 0911 ?Last data filed at 10/08/2021 5427 ?Gross per 24 hour  ?Intake --  ?Output 4500 ml  ?Net -4500 ml  ? ? ? ?Assessment/Plan:  51 y.o. male with R ankle wound ? ?CTA demonstrates mild, diffuse multilevel atherosclerosis of RLE ?Plan will be for RLE arteriogram with possible intervention early next week ?Dr. Sharol Given is planning wound debridement in the OR tomorrow ?Patient is aware he is at high risk for limb loss ? ? ?Dagoberto Ligas, PA-C ?[facility] ?[phone] ?10/09/2021 ?9:11 AM ? ? ?I agree with the above.  Will plan on angiogram next Tuesday.  Wound debridement tomorrow by Dr. Sharol Given. ? ?Annamarie Major ?

## 2021-10-09 NOTE — Progress Notes (Signed)
Pt receives out-pt HD at [facility] on MWF. Pt has a 9:15 chair time. Will assist as needed.  ? ?Melven Sartorius ?Renal Navigator ?[phone] ?

## 2021-10-09 NOTE — Progress Notes (Signed)
PT Cancellation Note ? ?Patient Details ?Name: Michael Riggs ?MRN: [id number] ?DOB: 03-Jul-1971 ? ? ?Cancelled Treatment:    Reason Eval/Treat Not Completed: Other (comment). Declines PT stating he is up to stand without issues and feels he can move well without working on strengthening today.  Encouraged him to contact nursing if he changes his mind. ? ? ?Ramond Dial ?10/09/2021, 3:26 PM ? ?Mee Hives, PT PhD ?Acute Rehab Dept. Number: [facility] [phone] and [place] [phone] ? ?

## 2021-10-09 NOTE — H&P (View-Only) (Signed)
[name], male   DOB: 10/08/1970, 51 y.o.   MRN: [id number] ?In reviewing the CT angiogram.  Discussions with vascular vein surgery anticipate patient will have arteriogram study with vascular surgery.  With patient's desire to proceed with limb salvage intervention we will plan for debridement of the Achilles ulcer placement of a wound VAC and biologic tissue graft.  Risk and benefits were discussed including potential for amputation.  Plan for surgery tomorrow on Friday. ?

## 2021-10-09 NOTE — Progress Notes (Addendum)
 [facility] Progress Note   Subjective: Seen in room. No C/Os. No new issues reported overnight.  Worried about losing RLE. Emotional support to patient.     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  Heart: S1.S2 no M/R/G.  Lungs: CTAB Abdomen: NABS, NT, ND Extremities: L BKA. No stump edema. RLE with drsg intact R ankle. Trace edema RLE only.   Dialysis Access: L AVF +T/B   Additional Objective Labs: Basic Metabolic Panel: Recent Labs  Lab 10/07/21 0645 10/08/21 0130 10/09/21 0545  NA 130* 132* 133*  K 4.8 4.6 4.3  CL 87* 93* 92*  CO2 24 23 26   GLUCOSE 123* 134* 132*  BUN 58* 42* 38*  CREATININE 9.62* 8.03* 6.86*  CALCIUM  9.5 9.5 9.5  PHOS  --  4.4  --    Liver Function Tests: Recent Labs  Lab 10/06/21 1243  AST 19  ALT 15  ALKPHOS 92  BILITOT 1.5*  PROT 7.3  ALBUMIN  3.2*   No results for input(s): LIPASE, AMYLASE in the last 168 hours. CBC: Recent Labs  Lab 10/06/21 1243 10/07/21 0645 10/08/21 0130 10/09/21 0545  WBC 11.9* 11.7* 10.4 11.4*  NEUTROABS 10.5* 9.9* 7.8* 8.0*  HGB 13.2 13.1 13.8 13.9  HCT 42.1 40.0 42.7 42.5  MCV 103.2* 101.3* 102.2* 101.2*  PLT 202 197 204 217   Blood Culture    Component Value Date/Time   SDES BLOOD RIGHT HAND 10/07/2021 0657   SPECREQUEST  10/07/2021 0657    BOTTLES DRAWN AEROBIC AND ANAEROBIC Blood Culture adequate volume   CULT  10/07/2021 0657    NO GROWTH 2 DAYS Performed at [facility], [address], [place], [place] [address]    REPTSTATUS PENDING 10/07/2021 9342     Cardiac Enzymes: No results for input(s): CKTOTAL, CKMB, CKMBINDEX, TROPONINI in the last 168 hours. CBG: Recent Labs  Lab 10/07/21 0443 10/08/21 1151 10/08/21 2034 10/09/21 0552 10/09/21 0814  GLUCAP 134* 126* 235* 140* 151*   Iron  Studies: No results for input(s): IRON , TIBC, TRANSFERRIN, FERRITIN in the last 72 hours. @lablastinr3 @ Studies/Results: MR ANGIO HEAD WO CONTRAST  Result Date: 10/08/2021 CLINICAL DATA:  Stroke follow-up EXAM: MRA HEAD WITHOUT CONTRAST TECHNIQUE: Angiographic images of the Circle of Willis were acquired using MRA technique without intravenous contrast. COMPARISON:  Brain MRI 2 days ago FINDINGS: Anterior circulation: Smaller right ICA, at least partially developmental given fetal type left PCA flow and a hypoplastic right A1 segment. Tandem high-grade narrowings of the right ICA at the anterior genu and paraclinoid segments. 1 mm inferiorly pointing infundibulum or aneurysm at the left supraclinoid ICA. No branch occlusion, beading, or vascular malformation Posterior circulation: Diminutive flow in the left vertebral artery. The right vertebral and basilar arteries are diffusely patent. No major branch occlusion detected. Origins of the PICA are not covered bilaterally. Anatomic variants: As above Other: Mild generalized motion artifact. IMPRESSION: 1. Tandem high-grade narrowings of the intracranial right ICA. 2. Diminutive left vertebral, not  expected on congenital grounds given a 2021 chest CTA, consider neck CTA. 3. 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 Electronically Signed   By: Dorn Roulette M.D.   On: 10/08/2021 10:51   CT ANGIO AO+BIFEM W & OR WO CONTRAST  Result Date: 10/09/2021 CLINICAL DATA:  Claudication or leg ischemia EXAM: CT ANGIOGRAPHY OF ABDOMINAL AORTA WITH ILIOFEMORAL RUNOFF TECHNIQUE: Multidetector CT imaging of the abdomen, pelvis and lower extremities was performed using the standard protocol during bolus  administration of intravenous contrast. Multiplanar CT image reconstructions and MIPs were obtained to evaluate the vascular anatomy. RADIATION DOSE REDUCTION: This exam was performed according to the departmental dose-optimization program which includes automated exposure control, adjustment of the mA and/or kV according to patient size and/or use of iterative reconstruction technique. CONTRAST:  OMNIPAQUE  IOHEXOL  350 MG/ML SOLN COMPARISON:  None. FINDINGS: VASCULAR Aorta: Mild atherosclerotic calcification. No hemodynamically significant stenosis. No aneurysm or dissection. No periaortic inflammatory change. Celiac: Patent without evidence of aneurysm, dissection, vasculitis or significant stenosis. SMA: Patent without evidence of aneurysm, dissection, vasculitis or significant stenosis. Renals: The right renal artery is diffusely narrowed without focal stenosis identified. No dissection or aneurysm. The left renal artery is widely patent without evidence of aneurysm or dissection. IMA: Patent without evidence of aneurysm, dissection, vasculitis or significant stenosis. RIGHT Lower Extremity Inflow: Common, internal and external iliac arteries are patent without evidence of aneurysm, dissection, vasculitis or significant stenosis. Outflow: The common femoral artery and profundus femoral artery are widely patent. The superficial femoral artery demonstrates extensive arteriosclerosis with a 50% stenosis in its mid segment, axial image # 209/6 and a focal 50-75% stenosis at the adductor hiatus. Runoff: Focal 50% stenosis of the P2 segment of the popliteal artery, axial image # 289/6. 50% stenosis of the anterior tibial artery at its origin, axial image # 321/6. 50-75% stenosis of the tibioperoneal trunk at its origin, axial image # 323/6. Three-vessel runoff to the ankle. Dorsalis pedis artery and plantar arch are patent. LEFT Lower Extremity Inflow: Common, internal and external iliac arteries are patent  without evidence of aneurysm, dissection, vasculitis or significant stenosis. Outflow/runoff: Common femoral artery demonstrates a less than 50% stenosis. Profundus femoral artery demonstrates a focal 50% stenosis in its proximal segment, axial image # 183/6. Superficial femoral artery is occluded just beyond the adductor hiatus. Popliteal artery is occluded. Gastrocnemius arteries are collateralized from the profundus femoral artery. Proximal runoff vessels are occluded. Below-knee amputation has been performed. Veins: No obvious venous abnormality within the limitations of this arterial phase study. Review of the MIP images confirms the above findings. NON-VASCULAR Lower chest: Consolidation within the visualized left lower lobe may reflect changes of rounded atelectasis. Small to moderate left pleural effusion is present with associated pleural thickening. Cardiac size is enlarged. Extensive coronary artery calcification. Hepatobiliary: Cholelithiasis without pericholecystic inflammatory change. Liver unremarkable. Pancreas: Unremarkable Spleen: Unremarkable Adrenals/Urinary Tract: Adrenal glands are unremarkable. Kidneys are normal, without renal calculi, focal lesion, or hydronephrosis. Bladder is unremarkable. Stomach/Bowel: Moderate stool throughout the colon. The stomach, small bowel, and large bowel are otherwise unremarkable. Appendix normal. No free intraperitoneal gas or fluid. Lymphatic: Shotty retroperitoneal adenopathy is present similar appearance to remote prior examination 10/12/2019. No pathologic adenopathy within the abdomen and pelvis. Reproductive: Prostate is unremarkable. Other: No abdominal wall hernia Musculoskeletal: No acute bone abnormality. IMPRESSION: VASCULAR Advanced lower extremity arteriosclerosis. Serial hemodynamically significant stenoses within the right lower extremity arterial outflow involving the mid and distal right superficial femoral  artery. Serial stenoses of the right  lower extremity arterial runoff with hemodynamically significant stenoses of the P2 segment of the popliteal artery, origin of the anterior tibial artery, and origin of the tibioperoneal trunk. Preserved three-vessel runoff to the right ankle with patency of the dorsalis pedis artery and plantar arch. Left below-knee amputation. Occlusion of the distal left superficial femoral artery, popliteal artery, and residual proximal runoff vasculature. Focal 50% stenosis of the profundus femoral artery which provides collateral supply to multiple gastrocnemius arteries of the residual left lower extremity. NON-VASCULAR Complex appearing left pleural effusion demonstrating pleural thickening. Probable rounded atelectasis within the visualized left lower lobe. Extensive coronary artery calcification.  Mild cardiomegaly. Cholelithiasis. Moderate stool burden without evidence of obstruction. Aortic Atherosclerosis (ICD10-I70.0). Electronically Signed   By: Dorethia Molt M.D.   On: 10/09/2021 03:28   VAS US  ABI WITH/WO TBI  Result Date: 10/07/2021  LOWER EXTREMITY DOPPLER STUDY Patient Name:  Michael Riggs  Date of Exam:   10/07/2021 Medical Rec #: [id number]              Accession #:    [id number] Date of Birth: [birth year]              Patient Gender: M Patient Age:   51 years Exam Location:  [facility] Procedure:      VAS US  ABI WITH/WO TBI Referring Phys: TITORYA STOVER --------------------------------------------------------------------------------  Indications: Non-healing RLE achilles wound. High Risk Factors: Hypertension, hyperlipidemia, Diabetes, past history of                    smoking, prior CVA. Other Factors: ESRD (HD), calciphylaxis, LLE BKA.  Comparison Study: No previous exams Performing Technologist: Hill, Jody RVT, RDMS  Examination Guidelines: A complete evaluation includes at minimum, Doppler waveform signals and systolic blood pressure reading at the level of bilateral brachial, anterior  tibial, and posterior tibial arteries, when vessel segments are accessible. Bilateral testing is considered an integral part of a complete examination. Photoelectric Plethysmograph (PPG) waveforms and toe systolic pressure readings are included as required and additional duplex testing as needed. Limited examinations for reoccurring indications may be performed as noted.  ABI Findings: +---------+------------------+-----+---------+--------+ Right    Rt Pressure (mmHg)IndexWaveform Comment  +---------+------------------+-----+---------+--------+ Brachial 135                    triphasic         +---------+------------------+-----+---------+--------+ PTA                                      >[address]  +---------+------------------+-----+---------+--------+ DP                                       > [address] +---------+------------------+-----+---------+--------+ Great Toe68                0.50 Abnormal          +---------+------------------+-----+---------+--------+ +--------+------------------+-----+--------+-------+ Left    Lt Pressure (mmHg)IndexWaveformComment +--------+------------------+-----+--------+-------+ Brachial                               DIA     +--------+------------------+-----+--------+-------+ +-------+-----------+-----------+------------+------------+ ABI/TBIToday's ABIToday's TBIPrevious ABIPrevious TBI +-------+-----------+-----------+------------+------------+ Right           0.50                                +-------+-----------+-----------+------------+------------+  Arterial wall calcification precludes accurate ankle pressures and ABIs.  Summary: Right: Resting right ankle-brachial index indicates noncompressible right lower extremity arteries. The right toe-brachial index is abnormal (mild/moderately decreased).  *See table(s) above for measurements and observations.  Electronically signed by Lonni Blade MD on 10/07/2021 at  4:18:41 PM.    Final    VAS US  CAROTID  Result Date: 10/08/2021 Carotid Arterial Duplex Study Patient Name:  Michael Riggs  Date of Exam:   10/08/2021 Medical Rec #: [id number]              Accession #:    [id number] Date of Birth: January 01, 1971              Patient Gender: M Patient Age:   28 years Exam Location:  [facility] Procedure:      VAS US  CAROTID Referring Phys: ARY CUMMINS --------------------------------------------------------------------------------  Indications:      CVA. Risk Factors:     Hypertension, hyperlipidemia, Diabetes, past history of                   smoking. Other Factors:    ESRD. Comparison Study: No prior studies. Performing Technologist: Vernell Iba RDMS, RVT  Examination Guidelines: A complete evaluation includes B-mode imaging, spectral Doppler, color Doppler, and power Doppler as needed of all accessible portions of each vessel. Bilateral testing is considered an integral part of a complete examination. Limited examinations for reoccurring indications may be performed as noted.  Right Carotid Findings: +----------+--------+--------+--------+--------------------+--------+           PSV cm/sEDV cm/sStenosisPlaque Description  Comments +----------+--------+--------+--------+--------------------+--------+ CCA Prox  68      7                                            +----------+--------+--------+--------+--------------------+--------+ CCA Distal55      7                                            +----------+--------+--------+--------+--------------------+--------+ ICA Prox  27      11      1-39%   calcific and diffuse         +----------+--------+--------+--------+--------------------+--------+ ICA Distal40      16                                           +----------+--------+--------+--------+--------------------+--------+ ECA       86      8                                             +----------+--------+--------+--------+--------------------+--------+ +----------+--------+-------+----------------+-------------------+           PSV cm/sEDV cmsDescribe        Arm Pressure (mmHG) +----------+--------+-------+----------------+-------------------+ [id number]             Multiphasic, WNL                    +----------+--------+-------+----------------+-------------------+ +---------+--------+--+--------+---------+ VertebralPSV cm/s11EDV cm/sAntegrade +---------+--------+--+--------+---------+  Left Carotid Findings: +----------+--------+--------+--------+--------------------+--------+  PSV cm/sEDV cm/sStenosisPlaque Description  Comments +----------+--------+--------+--------+--------------------+--------+ CCA Prox  80      11                                           +----------+--------+--------+--------+--------------------+--------+ CCA Distal54      14                                           +----------+--------+--------+--------+--------------------+--------+ ICA Prox  51      16      1-39%   calcific and diffuse         +----------+--------+--------+--------+--------------------+--------+ ICA Distal42      16                                           +----------+--------+--------+--------+--------------------+--------+ ECA       55                                                   +----------+--------+--------+--------+--------------------+--------+ +----------+--------+--------+---------------------+-------------------+           PSV cm/sEDV cm/sDescribe             Arm Pressure (mmHG) +----------+--------+--------+---------------------+-------------------+ Subclavian115             Left AVF flow pattern                    +----------+--------+--------+---------------------+-------------------+ +---------+--------+--+--------+---------+ VertebralPSV cm/s18EDV cm/sAntegrade  +---------+--------+--+--------+---------+   Summary: Right Carotid: Velocities in the right ICA are consistent with a 1-39% stenosis. Left Carotid: Velocities in the left ICA are consistent with a 1-39% stenosis. Vertebrals:  Bilateral vertebral arteries demonstrate antegrade flow. Subclavians: Normal flow hemodynamics were seen in the right subclavian artery.              Left radiobasilic AVF present, subclavian within normal limits              proximal to AVF. *See table(s) above for measurements and observations.     Preliminary    Medications:  ceFEPime  (MAXIPIME ) IV      vitamin C  500 mg Oral Daily   aspirin  EC  81 mg Oral Daily   atorvastatin   40 mg Oral QHS   buPROPion   300 mg Oral Daily   calcitRIOL   2 mcg Oral Q M,W,F-HD   Chlorhexidine  Gluconate Cloth  6 each Topical Q0600   ferric citrate   630 mg Oral TID WC   heparin  injection (subcutaneous)  5,000 Units Subcutaneous Q8H   metoprolol  tartrate  25 mg Oral BID   midodrine   10 mg Oral Q M,W,F-HD   pantoprazole   40 mg Oral QHS   ramelteon   8 mg Oral QHS   tamsulosin   0.4 mg Oral QHS     OP HD: MWF [place]  5h  400/500  99.5kg   2/2 bath   LUA AVF   -No heparin  Heparin  none -no esa/ IV fe  - rocaltrol  2.0 tiw po     Assessment/ Plan: Infection/Nonhealing wound  R ankle - IV vanc/ cefepime ,  per primary. VVS consulted. Plans for LE arteriogram/possible intervention next week. At high risk for limb loss.  Fall with near syncope: Dizziness reported prior to fall. New 13mm infarct on CT. Possible artifact but W/U per primary/neurolgy.  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 Cardiology consulted. No evidence of excess volume by exam. Optimize volume with HD. H/O high IDWG. Discussed compliance with fluid restrictions.  ESRD - on HD MWF. Next HD 10/10/2021. HTN/ vol - HD 03/22 Net UF 4.5 L. Post wt 97.3 kg. Now under EDW. Continue to UF as tolerated.   H/O L BKA H/O severe calciphylaxis  Anemia ckd -  HGB 13.8.  No esa needs for now  MBD ckd - CCa in range. PO4 at goal. Cont binder, vdra  10.  Nutrition: Albumin  low. Add protein supps.   Timithy Arons H. Vivan Vanderveer NP-C 10/09/2021, 10:11 AM  [employer] [phone]

## 2021-10-09 NOTE — Progress Notes (Addendum)
Family Medicine Teaching Service ?Daily Progress Note ?Intern Pager: [phone] ? ?Patient name: Michael Riggs Medical record number: [id number] ?Date of birth: 08-29-1970 Age: 51 y.o. Gender: male ? ?Primary Care Provider: [name], MD ?Consultants: Podiatry (S/O), nephrology, orthopedic surgery, vascular surgery, wound care, neurology, cardiology (S/O) ?Code Status: Full ? ?Pt Overview and Major Events to Date:  ?3/20: Admitted ? ? ?Assessment and Plan: ?Michael Riggs is a 51 y.o. male presenting with fall and near syncope questionable for stroke, new onset HFrEF and chronic R leg wound. PMH is significant for ESRD on HD, HTN, left BKA, HLD, T2DM, anemia of chronic disease, history of CVA, OSA. ? ?Infected chronic R leg wound ?CTA AO&bifem showed significant stenoses within the right lower extremity arterial outflow involving the mid and distal right superficial femoral artery. Occlusion of the distal left superficial femoral artery, popliteal artery, and residual proximal runoff vasculature. Focal 50% stenosis of the profundus femoral artery.  WBC and ANC minimally changed from 10.4>11.4 and 7.8>8.0, respectively.  Ortho is considering proceeding with wound debridement on 3/24.  Blood cultures show no growth x2 days in one culture and then another with strep sanguinis.  Patient only needed pain medications twice yesterday. ?-Orthopedic surgery following, appreciate recommendations ?-Vascular surgery following, appreciate recommendations ?-Wound care following, appreciate assistance ?-Continue cefepime (3/20-) ?-Follow-up blood cultures ?-A.m. CBC with differential ?-Tylenol 1000 mg every 6 hours as needed, Oxy IR 5 mg every 6 hours as needed ? ?Fall with near syncope  new onset HFrEF  History of CVA ?Cardiology has signed off and advised outpatient follow-up scheduled for 10/20/2021. Neurology signed off with recommendations to continue aspirin and consider DAPT after procedures performed.  If MRI  is representative of stroke, etiology likely due to small vessel disease per neurology. Possible contaminant of strep sanguinis in one blood culture. ID curbsided and believe it is fair to not pursue TEE at this time and obtain repeat cultures in the outpatient setting and may additionally not need antibiotic treatment for wound. ?-Continue aspirin 81 mg and Lipitor 40 mg daily ?-Consider Plavix once procedures are performed ? ?L pleural effusion ?CTA AO&bifem showed complex appearing left pleural effusion demonstrating pleural thickening. Probable rounded atelectasis within the visualized left lower lobe. Extensive coronary artery calcification. Discussed with radiology. It appears pt has had these in the past and current pleural effusion is so small he likely would not feel it and with it being more chronic, that is likely what caused complex-appearing nature of it. No respiratory distress of note and would not advise draining at this time. ?-Follow outpatient ? ?ESRD on HD MWF ?Received dialysis yesterday. ?-Nephrology following, appreciate recommendations ?-Midodrine 10 mg for dialysis ?-A.m. BMP ? ?Hypertension: Stable ?Stable at 124/83.  Amlodipine discontinued to avoid hypotension. ?-Continue to monitor ? ?FEN/GI: Heart healthy ?PPx: Heparin SQ every 8 hours ?Dispo: pending clinical improvement . Barriers include ortho/vascular surgery treatment plan.  ? ?Subjective:  ?Pt states he is unhappy about the news for possibly losing his leg. He is asking for dental supplies so that he can brush his teeth. ? ?Objective: ?Temp:  [96.9 ?F (36.1 ?C)-98.4 ?F (36.9 ?C)] 98.4 ?F (36.9 ?C) (03/23 0557) ?Pulse Rate:  [56-85] 71 (03/23 0557) ?Resp:  [12-18] 17 (03/23 0557) ?BP: (119-161)/(65-107) 124/83 (03/23 0557) ?SpO2:  [1 %-100 %] 92 % (03/23 0557) ?Weight:  [97.3 kg-101.8 kg] 97.3 kg (03/22 1915) ?Physical Exam: ?General: awake, alert, NAD ?Oral: dentition appropriate with 1-2 missing teeth, no signs of abscesses or  infections ?Cardiovascular: RRR, no murmurs auscultated ?Respiratory: CTAB, no increased work of breathing ?Extremities: left BKA, R leg wound wrapped ? ?Laboratory: ?Recent Labs  ?Lab 10/07/21 ?0645 10/08/21 ?0130 10/09/21 ?6295  ?WBC 11.7* 10.4 11.4*  ?HGB 13.1 13.8 13.9  ?HCT 40.0 42.7 42.5  ?PLT 197 204 217  ? ?Recent Labs  ?Lab 10/06/21 ?1243 10/07/21 ?0645 10/08/21 ?0130 10/09/21 ?2841  ?NA 133* 130* 132* 133*  ?K 4.7 4.8 4.6 4.3  ?CL 91* 87* 93* 92*  ?CO2 29 24 23 26   ?BUN 45* 58* 42* 38*  ?CREATININE 8.17* 9.62* 8.03* 6.86*  ?CALCIUM 9.0 9.5 9.5 9.5  ?PROT 7.3  --   --   --   ?BILITOT 1.5*  --   --   --   ?ALKPHOS 92  --   --   --   ?ALT 15  --   --   --   ?AST 19  --   --   --   ?GLUCOSE 118* 123* 134* 132*  ? ?Imaging/Diagnostic Tests: ?MR ANGIO HEAD WO CONTRAST ? ?Result Date: 10/08/2021 ?CLINICAL DATA:  Stroke follow-up EXAM: MRA HEAD WITHOUT CONTRAST TECHNIQUE: Angiographic images of the Circle of Willis were acquired using MRA technique without intravenous contrast. COMPARISON:  Brain MRI 2 days ago FINDINGS: Anterior circulation: Smaller right ICA, at least partially developmental given fetal type left PCA flow and a hypoplastic right A1 segment. Tandem high-grade narrowings of the right ICA at the anterior genu and paraclinoid segments. 1 mm inferiorly pointing infundibulum or aneurysm at the left supraclinoid ICA. No branch occlusion, beading, or vascular malformation Posterior circulation: Diminutive flow in the left vertebral artery. The right vertebral and basilar arteries are diffusely patent. No major branch occlusion detected. Origins of the PICA are not covered bilaterally. Anatomic variants: As above Other: Mild generalized motion artifact. IMPRESSION: 1. Tandem high-grade narrowings of the intracranial right ICA. 2. Diminutive left vertebral, not expected on congenital grounds given a 2021 chest CTA, consider neck CTA. 3. 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  Electronically Signed   By: Jorje Guild M.D.   On: 10/08/2021 10:51  ? ?CT ANGIO AO+BIFEM W & OR WO CONTRAST ? ?Result Date: 10/09/2021 ?CLINICAL DATA:  Claudication or leg ischemia EXAM: CT ANGIOGRAPHY OF ABDOMINAL AORTA WITH ILIOFEMORAL RUNOFF TECHNIQUE: Multidetector CT imaging of the abdomen, pelvis and lower extremities was performed using the standard protocol during bolus administration of intravenous contrast. Multiplanar CT image reconstructions and MIPs were obtained to evaluate the vascular anatomy. RADIATION DOSE REDUCTION: This exam was performed according to the departmental dose-optimization program which includes automated exposure control, adjustment of the mA and/or kV according to patient size and/or use of iterative reconstruction technique. CONTRAST:  175mL OMNIPAQUE IOHEXOL 350 MG/ML SOLN COMPARISON:  None. FINDINGS: VASCULAR Aorta: Mild atherosclerotic calcification. No hemodynamically significant stenosis. No aneurysm or dissection. No periaortic inflammatory change. Celiac: Patent without evidence of aneurysm, dissection, vasculitis or significant stenosis. SMA: Patent without evidence of aneurysm, dissection, vasculitis or significant stenosis. Renals: The right renal artery is diffusely narrowed without focal stenosis identified. No dissection or aneurysm. The left renal artery is widely patent without evidence of aneurysm or dissection. IMA: Patent without evidence of aneurysm, dissection, vasculitis or significant stenosis. RIGHT Lower Extremity Inflow: Common, internal and external iliac arteries are patent without evidence of aneurysm, dissection, vasculitis or significant stenosis. Outflow: The common femoral artery and profundus femoral artery are widely patent. The superficial femoral artery demonstrates extensive arteriosclerosis with  a 50% stenosis in its mid segment, axial image # 209/6 and a focal 50-75% stenosis at the adductor hiatus. Runoff: Focal 50% stenosis of the P2  segment of the popliteal artery, axial image # 289/6. 50% stenosis of the anterior tibial artery at its origin, axial image # 321/6. 50-75% stenosis of the tibioperoneal trunk at its origin, axial image # 323/6. Three

## 2021-10-09 NOTE — TOC Progression Note (Signed)
Transition of Care (TOC) - Progression Note  ? ? ?Patient Details  ?Name: Michael Riggs ?MRN: [id number] ?Date of Birth: [birth year] ? ?Transition of Care (TOC) CM/SW Contact  ?Verdell Carmine, RN ?Phone Number: ?10/09/2021, 10:03 AM ? ?Clinical Narrative:    ?  ?51 year old dialysis patient. Asks to speak to CM. Patient is having problems at home. He cares and his brother care for his mother.Marland Kitchen He is having problems because  his apartment complex, section 8 housing, is stipulating/ having issues  with sister in law., she is not allowed over at their apartment, however his brother came over to care for his mother who has myriad health problems. They are saying to him he has a section 8 violation, as the sister in law is not allowed over He is also semi housing his 83 year old niece.  ?His worry is for his mother. Discussed going through PCP for convalescent care, however he is not willing to place her. His brother works night shift but is home resting during day. She has [facility], but they were not doing "right "by her and he let them go.  ?He likely will need Home health care as well.  ?Will consult with social work to see if there is anything we can suggest that will be helpful.. ? ?Expected Discharge Plan: [place] ?Barriers to Discharge: Family Issues ? ?Expected Discharge Plan and Services ?Expected Discharge Plan: [place] ?  ?Discharge Planning Services: CM Consult ?  ?Living arrangements for the past 2 months: Apartment ?                ?  ?  ?  ?  ?  ?  ?  ?  ?  ?  ? ? ?Social Determinants of Health (SDOH) Interventions ?  ? ?Readmission Risk Interventions ?   ? View : No data to display.  ?  ?  ?  ? ? ?

## 2021-10-10 ENCOUNTER — Encounter (HOSPITAL_COMMUNITY): Payer: Self-pay | Admitting: Student

## 2021-10-10 ENCOUNTER — Inpatient Hospital Stay (HOSPITAL_COMMUNITY): Payer: Medicare Other | Admitting: Anesthesiology

## 2021-10-10 ENCOUNTER — Other Ambulatory Visit: Payer: Self-pay

## 2021-10-10 ENCOUNTER — Encounter (HOSPITAL_COMMUNITY)
Admission: EM | Disposition: A | Discharge status: Home / Self Care | Payer: Self-pay | Source: Ambulatory Visit | Attending: Family Medicine

## 2021-10-10 DIAGNOSIS — G4733 Obstructive sleep apnea (adult) (pediatric): Secondary | ICD-10-CM

## 2021-10-10 DIAGNOSIS — E1122 Type 2 diabetes mellitus with diabetic chronic kidney disease: Secondary | ICD-10-CM

## 2021-10-10 DIAGNOSIS — Z87891 Personal history of nicotine dependence: Secondary | ICD-10-CM

## 2021-10-10 DIAGNOSIS — I132 Hypertensive heart and chronic kidney disease with heart failure and with stage 5 chronic kidney disease, or end stage renal disease: Secondary | ICD-10-CM

## 2021-10-10 DIAGNOSIS — I5042 Chronic combined systolic (congestive) and diastolic (congestive) heart failure: Secondary | ICD-10-CM

## 2021-10-10 DIAGNOSIS — N186 End stage renal disease: Secondary | ICD-10-CM

## 2021-10-10 DIAGNOSIS — I739 Peripheral vascular disease, unspecified: Secondary | ICD-10-CM

## 2021-10-10 DIAGNOSIS — E1152 Type 2 diabetes mellitus with diabetic peripheral angiopathy with gangrene: Secondary | ICD-10-CM | POA: Diagnosis not present

## 2021-10-10 DIAGNOSIS — F1021 Alcohol dependence, in remission: Secondary | ICD-10-CM

## 2021-10-10 DIAGNOSIS — S91309A Unspecified open wound, unspecified foot, initial encounter: Secondary | ICD-10-CM | POA: Diagnosis not present

## 2021-10-10 DIAGNOSIS — L03115 Cellulitis of right lower limb: Secondary | ICD-10-CM | POA: Diagnosis not present

## 2021-10-10 DIAGNOSIS — L97318 Non-pressure chronic ulcer of right ankle with other specified severity: Secondary | ICD-10-CM

## 2021-10-10 DIAGNOSIS — Z8673 Personal history of transient ischemic attack (TIA), and cerebral infarction without residual deficits: Secondary | ICD-10-CM

## 2021-10-10 DIAGNOSIS — E11621 Type 2 diabetes mellitus with foot ulcer: Secondary | ICD-10-CM

## 2021-10-10 DIAGNOSIS — Z992 Dependence on renal dialysis: Secondary | ICD-10-CM

## 2021-10-10 HISTORY — PX: I & D EXTREMITY: SHX5045

## 2021-10-10 LAB — CBC WITH DIFFERENTIAL/PLATELET
Abs Immature Granulocytes: 0.49 10*3/uL — ABNORMAL HIGH (ref 0.00–0.07)
Basophils Absolute: 0.1 10*3/uL (ref 0.0–0.1)
Basophils Relative: 1 %
Eosinophils Absolute: 0.2 10*3/uL (ref 0.0–0.5)
Eosinophils Relative: 2 %
HCT: 42.4 % (ref 39.0–52.0)
Hemoglobin: 13.5 g/dL (ref 13.0–17.0)
Immature Granulocytes: 4 %
Lymphocytes Relative: 12 %
Lymphs Abs: 1.5 10*3/uL (ref 0.7–4.0)
MCH: 32.5 pg (ref 26.0–34.0)
MCHC: 31.8 g/dL (ref 30.0–36.0)
MCV: 101.9 fL — ABNORMAL HIGH (ref 80.0–100.0)
Monocytes Absolute: 1.7 10*3/uL — ABNORMAL HIGH (ref 0.1–1.0)
Monocytes Relative: 14 %
Neutro Abs: 8.5 10*3/uL — ABNORMAL HIGH (ref 1.7–7.7)
Neutrophils Relative %: 67 %
Platelets: 250 10*3/uL (ref 150–400)
RBC: 4.16 MIL/uL — ABNORMAL LOW (ref 4.22–5.81)
RDW: 16.4 % — ABNORMAL HIGH (ref 11.5–15.5)
WBC: 12.5 10*3/uL — ABNORMAL HIGH (ref 4.0–10.5)
nRBC: 0 % (ref 0.0–0.2)

## 2021-10-10 LAB — POCT I-STAT, CHEM 8
BUN: 59 mg/dL — ABNORMAL HIGH (ref 6–20)
Calcium, Ion: 1.17 mmol/L (ref 1.15–1.40)
Chloride: 93 mmol/L — ABNORMAL LOW (ref 98–111)
Creatinine, Ser: 9.2 mg/dL — ABNORMAL HIGH (ref 0.61–1.24)
Glucose, Bld: 162 mg/dL — ABNORMAL HIGH (ref 70–99)
HCT: 45 % (ref 39.0–52.0)
Hemoglobin: 15.3 g/dL (ref 13.0–17.0)
Potassium: 4.6 mmol/L (ref 3.5–5.1)
Sodium: 130 mmol/L — ABNORMAL LOW (ref 135–145)
TCO2: 26 mmol/L (ref 22–32)

## 2021-10-10 LAB — SURGICAL PCR SCREEN
MRSA, PCR: NEGATIVE
Staphylococcus aureus: NEGATIVE

## 2021-10-10 LAB — GLUCOSE, CAPILLARY
Glucose-Capillary: 184 mg/dL — ABNORMAL HIGH (ref 70–99)
Glucose-Capillary: 130 mg/dL — ABNORMAL HIGH (ref 70–99)
Glucose-Capillary: 89 mg/dL (ref 70–99)
Glucose-Capillary: 114 mg/dL — ABNORMAL HIGH (ref 70–99)

## 2021-10-10 SURGERY — IRRIGATION AND DEBRIDEMENT EXTREMITY
Anesthesia: Monitor Anesthesia Care | Site: Foot | Laterality: Right

## 2021-10-10 MED ORDER — OXYCODONE HCL 5 MG PO TABS: 5.0000 mg | ORAL_TABLET | ORAL | Status: DC | PRN

## 2021-10-10 MED ORDER — POVIDONE-IODINE 10 % EX SWAB
2.0000 "application " | Freq: Once | CUTANEOUS | Status: AC
  Administered 2021-10-10: 2 via TOPICAL

## 2021-10-10 MED ORDER — FENTANYL CITRATE (PF) 250 MCG/5ML IJ SOLN
INTRAMUSCULAR | Status: AC
  Filled 2021-10-10: qty 5

## 2021-10-10 MED ORDER — INSULIN ASPART 100 UNIT/ML IJ SOLN
0.0000 [IU] | INTRAMUSCULAR | Status: DC | PRN
  Administered 2021-10-10: 2 [IU] via SUBCUTANEOUS

## 2021-10-10 MED ORDER — ONDANSETRON HCL 4 MG PO TABS: 4.0000 mg | ORAL_TABLET | Freq: Four times a day (QID) | ORAL | Status: DC | PRN

## 2021-10-10 MED ORDER — ONDANSETRON HCL 4 MG/2ML IJ SOLN
4.0000 mg | Freq: Four times a day (QID) | INTRAMUSCULAR | Status: DC | PRN
  Administered 2021-10-10: 4 mg via INTRAVENOUS
  Filled 2021-10-10: qty 2

## 2021-10-10 MED ORDER — DIPHENHYDRAMINE HCL 25 MG PO CAPS
ORAL_CAPSULE | ORAL | Status: AC
  Administered 2021-10-10: 25 mg via ORAL
  Filled 2021-10-10: qty 1

## 2021-10-10 MED ORDER — PHENYLEPHRINE 40 MCG/ML (10ML) SYRINGE FOR IV PUSH (FOR BLOOD PRESSURE SUPPORT)
PREFILLED_SYRINGE | INTRAVENOUS | Status: AC
  Filled 2021-10-10: qty 10

## 2021-10-10 MED ORDER — SODIUM CHLORIDE 0.9 % IV SOLN: INTRAVENOUS | Status: DC | PRN

## 2021-10-10 MED ORDER — OXYCODONE HCL 5 MG PO TABS
10.0000 mg | ORAL_TABLET | ORAL | Status: DC | PRN
  Administered 2021-10-10: 15 mg via ORAL
  Filled 2021-10-10: qty 3

## 2021-10-10 MED ORDER — METHOCARBAMOL 1000 MG/10ML IJ SOLN
500.0000 mg | Freq: Four times a day (QID) | INTRAVENOUS | Status: DC | PRN
  Filled 2021-10-10: qty 5

## 2021-10-10 MED ORDER — FENTANYL CITRATE (PF) 250 MCG/5ML IJ SOLN
INTRAMUSCULAR | Status: DC | PRN
  Administered 2021-10-10: 50 ug via INTRAVENOUS

## 2021-10-10 MED ORDER — CHLORHEXIDINE GLUCONATE 0.12 % MT SOLN
15.0000 mL | Freq: Once | OROMUCOSAL | Status: AC
  Filled 2021-10-10: qty 15

## 2021-10-10 MED ORDER — HYDROMORPHONE HCL 1 MG/ML IJ SOLN: 0.5000 mg | INTRAMUSCULAR | Status: DC | PRN

## 2021-10-10 MED ORDER — CHLORHEXIDINE GLUCONATE 4 % EX LIQD
60.0000 mL | Freq: Once | CUTANEOUS | Status: AC
  Administered 2021-10-10: 4 via TOPICAL
  Filled 2021-10-10: qty 60

## 2021-10-10 MED ORDER — LIDOCAINE 2% (20 MG/ML) 5 ML SYRINGE
INTRAMUSCULAR | Status: AC
  Filled 2021-10-10: qty 5

## 2021-10-10 MED ORDER — OXYCODONE HCL 5 MG/5ML PO SOLN: 5.0000 mg | Freq: Once | ORAL | Status: DC | PRN

## 2021-10-10 MED ORDER — ONDANSETRON HCL 4 MG/2ML IJ SOLN
INTRAMUSCULAR | Status: AC
  Filled 2021-10-10: qty 2

## 2021-10-10 MED ORDER — PROPOFOL 10 MG/ML IV BOLUS
INTRAVENOUS | Status: DC | PRN
  Administered 2021-10-10: 50 mg via INTRAVENOUS

## 2021-10-10 MED ORDER — DOCUSATE SODIUM 100 MG PO CAPS
100.0000 mg | ORAL_CAPSULE | Freq: Two times a day (BID) | ORAL | Status: DC
  Administered 2021-10-10 – 2021-10-15 (×9): 100 mg via ORAL
  Filled 2021-10-10 (×11): qty 1

## 2021-10-10 MED ORDER — ONDANSETRON HCL 4 MG/2ML IJ SOLN
INTRAMUSCULAR | Status: DC | PRN
  Administered 2021-10-10: 4 mg via INTRAVENOUS

## 2021-10-10 MED ORDER — MIDAZOLAM HCL 2 MG/2ML IJ SOLN
INTRAMUSCULAR | Status: DC | PRN
  Administered 2021-10-10: 1 mg via INTRAVENOUS

## 2021-10-10 MED ORDER — PHENYLEPHRINE 40 MCG/ML (10ML) SYRINGE FOR IV PUSH (FOR BLOOD PRESSURE SUPPORT)
PREFILLED_SYRINGE | INTRAVENOUS | Status: DC | PRN
  Administered 2021-10-10 (×2): 40 ug via INTRAVENOUS

## 2021-10-10 MED ORDER — ENSURE PRE-SURGERY PO LIQD
296.0000 mL | Freq: Once | ORAL | Status: AC
  Administered 2021-10-10: 296 mL via ORAL
  Filled 2021-10-10: qty 296

## 2021-10-10 MED ORDER — 0.9 % SODIUM CHLORIDE (POUR BTL) OPTIME
TOPICAL | Status: DC | PRN
  Administered 2021-10-10: 1000 mL

## 2021-10-10 MED ORDER — INSULIN ASPART 100 UNIT/ML IJ SOLN
INTRAMUSCULAR | Status: AC
  Filled 2021-10-10: qty 1

## 2021-10-10 MED ORDER — ACETAMINOPHEN 500 MG PO TABS
1000.0000 mg | ORAL_TABLET | Freq: Once | ORAL | Status: AC
  Administered 2021-10-10: 1000 mg via ORAL
  Filled 2021-10-10: qty 2

## 2021-10-10 MED ORDER — OXYCODONE HCL 5 MG PO TABS: 5.0000 mg | ORAL_TABLET | Freq: Once | ORAL | Status: DC | PRN

## 2021-10-10 MED ORDER — LIDOCAINE 2% (20 MG/ML) 5 ML SYRINGE
INTRAMUSCULAR | Status: DC | PRN
  Administered 2021-10-10: 100 mg via INTRAVENOUS

## 2021-10-10 MED ORDER — BISACODYL 10 MG RE SUPP: 10.0000 mg | Freq: Every day | RECTAL | Status: DC | PRN

## 2021-10-10 MED ORDER — METOCLOPRAMIDE HCL 5 MG/ML IJ SOLN: 5.0000 mg | Freq: Three times a day (TID) | INTRAMUSCULAR | Status: DC | PRN

## 2021-10-10 MED ORDER — PROPOFOL 500 MG/50ML IV EMUL
INTRAVENOUS | Status: DC | PRN
  Administered 2021-10-10: 60 ug/kg/min via INTRAVENOUS

## 2021-10-10 MED ORDER — DIPHENHYDRAMINE HCL 25 MG PO CAPS
25.0000 mg | ORAL_CAPSULE | Freq: Four times a day (QID) | ORAL | Status: DC | PRN
  Administered 2021-10-12 – 2021-10-15 (×4): 25 mg via ORAL
  Filled 2021-10-10 (×4): qty 1

## 2021-10-10 MED ORDER — POLYETHYLENE GLYCOL 3350 17 G PO PACK: 17.0000 g | PACK | Freq: Every day | ORAL | Status: DC | PRN

## 2021-10-10 MED ORDER — METHOCARBAMOL 500 MG PO TABS
500.0000 mg | ORAL_TABLET | Freq: Four times a day (QID) | ORAL | Status: DC | PRN
  Administered 2021-10-10: 500 mg via ORAL
  Filled 2021-10-10: qty 1

## 2021-10-10 MED ORDER — SODIUM CHLORIDE 0.9 % IV SOLN: INTRAVENOUS | Status: DC

## 2021-10-10 MED ORDER — ONDANSETRON HCL 4 MG/2ML IJ SOLN: 4.0000 mg | Freq: Once | INTRAMUSCULAR | Status: DC | PRN

## 2021-10-10 MED ORDER — METOCLOPRAMIDE HCL 5 MG PO TABS: 5.0000 mg | ORAL_TABLET | Freq: Three times a day (TID) | ORAL | Status: DC | PRN

## 2021-10-10 MED ORDER — CHLORHEXIDINE GLUCONATE 0.12 % MT SOLN
OROMUCOSAL | Status: AC
  Administered 2021-10-10: 15 mL via OROMUCOSAL
  Filled 2021-10-10: qty 15

## 2021-10-10 MED ORDER — CEFAZOLIN SODIUM-DEXTROSE 2-4 GM/100ML-% IV SOLN
2.0000 g | INTRAVENOUS | Status: AC
  Administered 2021-10-10: 2 g via INTRAVENOUS
  Filled 2021-10-10: qty 100

## 2021-10-10 MED ORDER — ACETAMINOPHEN 325 MG PO TABS: 325.0000 mg | ORAL_TABLET | Freq: Four times a day (QID) | ORAL | Status: DC | PRN

## 2021-10-10 MED ORDER — FENTANYL CITRATE (PF) 100 MCG/2ML IJ SOLN: 25.0000 ug | INTRAMUSCULAR | Status: DC | PRN

## 2021-10-10 MED ORDER — MIDAZOLAM HCL 2 MG/2ML IJ SOLN
INTRAMUSCULAR | Status: AC
  Filled 2021-10-10: qty 2

## 2021-10-10 SURGICAL SUPPLY — 37 items
BAG COUNTER SPONGE SURGICOUNT (BAG) IMPLANT
BAG SPNG CNTER NS LX DISP (BAG)
BLADE SURG 21 STRL SS (BLADE) ×2 IMPLANT
BNDG COHESIVE 6X5 TAN NS LF (GAUZE/BANDAGES/DRESSINGS) ×1 IMPLANT
BNDG COHESIVE 6X5 TAN STRL LF (GAUZE/BANDAGES/DRESSINGS) IMPLANT
CANISTER WOUNDNEG PRESSURE 500 (CANNISTER) ×1 IMPLANT
COVER SURGICAL LIGHT HANDLE (MISCELLANEOUS) ×4 IMPLANT
DRAPE DERMATAC (DRAPES) ×2 IMPLANT
DRAPE U-SHAPE 47X51 STRL (DRAPES) ×2 IMPLANT
DRESSING VERAFLO CLEANS CC MED (GAUZE/BANDAGES/DRESSINGS) IMPLANT
DRSG VERAFLO CLEANSE CC MED (GAUZE/BANDAGES/DRESSINGS) ×2
DURAPREP 26ML APPLICATOR (WOUND CARE) ×2 IMPLANT
ELECT REM PT RETURN 9FT ADLT (ELECTROSURGICAL)
ELECTRODE REM PT RTRN 9FT ADLT (ELECTROSURGICAL) IMPLANT
GLOVE SURG ORTHO LTX SZ9 (GLOVE) ×2 IMPLANT
GLOVE SURG UNDER POLY LF SZ9 (GLOVE) ×2 IMPLANT
GOWN STRL REUS W/ TWL XL LVL3 (GOWN DISPOSABLE) ×2 IMPLANT
GOWN STRL REUS W/TWL XL LVL3 (GOWN DISPOSABLE) ×4
GRAFT SKIN WND MICRO 38 (Tissue) ×1 IMPLANT
GRAFT SKIN WND OMEGA3 7X10 (Tissue) ×2 IMPLANT
GRAFT SKN 7X10XSTRL LF DISP (Tissue) IMPLANT
HANDPIECE INTERPULSE COAX TIP (DISPOSABLE)
KIT BASIN OR (CUSTOM PROCEDURE TRAY) ×2 IMPLANT
KIT TURNOVER KIT B (KITS) ×2 IMPLANT
MANIFOLD NEPTUNE II (INSTRUMENTS) ×2 IMPLANT
NS IRRIG 1000ML POUR BTL (IV SOLUTION) ×2 IMPLANT
PACK ORTHO EXTREMITY (CUSTOM PROCEDURE TRAY) ×2 IMPLANT
PAD ARMBOARD 7.5X6 YLW CONV (MISCELLANEOUS) ×4 IMPLANT
PAD NEG PRESSURE SENSATRAC (MISCELLANEOUS) ×1 IMPLANT
SET HNDPC FAN SPRY TIP SCT (DISPOSABLE) IMPLANT
STOCKINETTE IMPERVIOUS 9X36 MD (GAUZE/BANDAGES/DRESSINGS) IMPLANT
SUT ETHILON 2 0 PSLX (SUTURE) ×3 IMPLANT
SWAB COLLECTION DEVICE MRSA (MISCELLANEOUS) ×2 IMPLANT
SWAB CULTURE ESWAB REG 1ML (MISCELLANEOUS) IMPLANT
TOWEL GREEN STERILE (TOWEL DISPOSABLE) ×2 IMPLANT
TUBE CONNECTING 12X1/4 (SUCTIONS) ×2 IMPLANT
YANKAUER SUCT BULB TIP NO VENT (SUCTIONS) ×2 IMPLANT

## 2021-10-10 NOTE — Op Note (Addendum)
10/10/2021 ? ?10:28 AM ? ?PATIENT:  Michael Riggs   ? ?PRE-OPERATIVE DIAGNOSIS:  Right Achilles Ulcer ? ?POST-OPERATIVE DIAGNOSIS:  Same ? ?PROCEDURE:  RIGHT ACHILLES DEBRIDEMENT AND TISSUE GRAFT ? ?Partial excision of the necrotic calcaneus. ?Application of Kerecis grafts 3 x 7, 7 x 10, 38 cm?Marland Kitchen ?Application of cleanse choice wound VAC sponge. ? ?SURGEON:  Michael Minion, MD ? ?PHYSICIAN ASSISTANT:None ?ANESTHESIA:   General ? ?PREOPERATIVE INDICATIONS:  Michael Riggs is a  51 y.o. male with a diagnosis of Right Achilles Ulcer who failed conservative measures and elected for surgical management.   ? ?The risks benefits and alternatives were discussed with the patient preoperatively including but not limited to the risks of infection, bleeding, nerve injury, cardiopulmonary complications, the need for revision surgery, among others, and the patient was willing to proceed. ? ?OPERATIVE IMPLANTS: Kerecis 7 x 10 cm, 3 x 7 cm, 38 cm? micro powder ? ?@ENCIMAGES @ ? ?OPERATIVE FINDINGS: Wound bed had good healthy bleeding as well as the debrided calcaneus had healthy bleeding. ? ?OPERATIVE PROCEDURE: Patient was brought the operating room and underwent a general anesthetic.  After adequate levels anesthesia were obtained patient's right lower extremity was prepped using Betadine paint and draped into a sterile field a timeout was called.  A 21 blade knife was used to excise muscle Achilles tendon skin soft tissue fascia.  A rondure was used to partially excise part of the calcaneus.  Wound was irrigated normal saline. ? ?Soft tissue was sent for cultures. ? ?The Kerecis powder was applied to the wound bed this was then covered with the 3 x 7 cm graft and then this was overlaid with the 7 x 10 cm graft.  Secured in place with staples and a Praveena cleanse choice wound VAC was applied this had a good suction fit patient was extubated taken the PACU in stable condition. ? ?Debridement type: Excisional  Debridement ? ?Side: right ? ?Body Location: achilles  ? ?Tools used for debridement: scalpel and rongeur ? ?Pre-debridement Wound size (cm):   Length: 5        Width: 5     Depth: 1  ? ?Post-debridement Wound size (cm):   Length: 7        Width: 10     Depth: 2  ? ?Debridement depth beyond dead/damaged tissue down to healthy viable tissue: yes ? ?Tissue layer involved: skin, subcutaneous tissue, muscle / fascia, bone ? ?Nature of tissue removed: Slough, Necrotic, Devitalized Tissue, and Non-viable tissue ? ?Irrigation volume: 1 liter    ? ?Irrigation fluid type: Normal Saline ? ? ? ?DISCHARGE PLANNING: ? ?Antibiotic duration: Continue IV antibiotics adjust according to cultures from tissue sample ? ?Weightbearing: Nonweightbearing on the right ? ?Pain medication: Opioid pathway ? ?Dressing care/ Wound VAC: Continue wound VAC for 1 week ? ?Ambulatory devices: Walker ? ?Discharge to: Discharge planning based on therapy recommendations. ? ?Follow-up: In the office 1 week post operative. ? ? ? ? ? ? ?  ?

## 2021-10-10 NOTE — Anesthesia Postprocedure Evaluation (Signed)
Anesthesia Post Note ? ?Patient: Carver Murakami ? ?Procedure(s) Performed: RIGHT ACHILLES DEBRIDEMENT AND TISSUE GRAFT (Right: Foot) ? ?  ? ?Patient location during evaluation: PACU ?Anesthesia Type: MAC ?Level of consciousness: awake and alert ?Pain management: pain level controlled ?Vital Signs Assessment: post-procedure vital signs reviewed and stable ?Respiratory status: spontaneous breathing, nonlabored ventilation and respiratory function stable ?Cardiovascular status: blood pressure returned to baseline and stable ?Postop Assessment: no apparent nausea or vomiting ?Anesthetic complications: no ? ? ?No notable events documented. ? ?Last Vitals:  ?Vitals:  ? 10/10/21 1400 10/10/21 1430  ?BP: (!) 116/54 129/66  ?Pulse: 62 64  ?Resp:  16  ?Temp:    ?SpO2:    ?  ?Last Pain:  ?Vitals:  ? 10/10/21 1142  ?TempSrc: Temporal  ?PainSc:   ? ? ?  ?  ?  ?  ?  ?  ? ?Lidia Collum ? ? ? ? ?

## 2021-10-10 NOTE — Transfer of Care (Signed)
Immediate Anesthesia Transfer of Care Note ? ?Patient: Michael Riggs ? ?Procedure(s) Performed: RIGHT ACHILLES DEBRIDEMENT AND TISSUE GRAFT (Right: Foot) ? ?Patient Location: PACU ? ?Anesthesia Type:MAC ? ?Level of Consciousness: drowsy ? ?Airway & Oxygen Therapy: Patient Spontanous Breathing and Patient connected to face mask oxygen ? ?Post-op Assessment: Report given to RN and Post -op Vital signs reviewed and stable ? ?Post vital signs: Reviewed and stable ? ?Last Vitals:  ?Vitals Value Taken Time  ?BP 117/72 10/10/21 1031  ?Temp    ?Pulse 61 10/10/21 1034  ?Resp 13 10/10/21 1034  ?SpO2 100 % 10/10/21 1034  ?Vitals shown include unvalidated device data. ? ?Last Pain:  ?Vitals:  ? 10/10/21 0825  ?TempSrc: Oral  ?PainSc:   ?   ? ?Patients Stated Pain Goal: 3 (10/09/21 1207) ? ?Complications: No notable events documented. ?

## 2021-10-10 NOTE — Progress Notes (Signed)
FPTS Brief Progress Note ? ?S: Patient seen resting comfortably at bedside.  Spoke with overnight nurse and she reported no concerns at this time. ? ? ?O: ?BP (!) 166/98 (BP Location: Right Arm)   Pulse 74   Temp 98.1 ?F (36.7 ?C) (Oral)   Resp 18   Ht 5\' 10"  (1.778 m)   Wt 97.3 kg   SpO2 96%   BMI 30.78 kg/m?   ? ? ?A/P: ?- Plan per day team. NPO at midnight. Debridement scheduled for today of right achilles 9:15 AM ?- Orders reviewed. Labs for AM ordered, which was adjusted as needed.  ?- If condition changes, plan includes bedside eval.  ? ?France Ravens, MD ?10/10/2021, 2:14 AM ?PGY-1, [facility] Night Resident  ?Please page [phone] with questions.  ? ?

## 2021-10-10 NOTE — Anesthesia Preprocedure Evaluation (Signed)
Anesthesia Evaluation  ?Patient identified by MRN, date of birth, ID band ?Patient awake ? ? ? ?Reviewed: ?Allergy & Precautions, NPO status , Patient's Chart, lab work & pertinent test results ? ?History of Anesthesia Complications ?Negative for: history of anesthetic complications ? ?Airway ?Mallampati: II ? ?TM Distance: >3 FB ?Neck ROM: Full ? ? ? Dental ? ?(+) Teeth Intact ?  ?Pulmonary ?sleep apnea and Continuous Positive Airway Pressure Ventilation , former smoker,  ?  ?Pulmonary exam normal ? ? ? ? ? ? ? Cardiovascular ?hypertension, + Peripheral Vascular Disease and +CHF  ?Normal cardiovascular exam ? ? ?Echo 10/07/21: EF 40%, global hypokinesis, mod LVH, grade 3 dd, low normal RVSF, mild pulm HTN (RVSP 41.8), calcification of MV/AV without stenosis ?  ?Neuro/Psych ?CVA   ? GI/Hepatic ?GERD  ,(+)  ?  ? substance abuse ? alcohol use,   ?Endo/Other  ?diabetes, Type 2 ? Renal/GU ?ESRF and DialysisRenal disease  ?negative genitourinary ?  ?Musculoskeletal ?negative musculoskeletal ROS ?(+)  ? Abdominal ?  ?Peds ? Hematology ?negative hematology ROS ?(+)   ?Anesthesia Other Findings ? ?Nonhealing heel ulcer ? Reproductive/Obstetrics ? ?  ? ? ? ? ? ? ? ? ? ? ? ? ? ?  ?  ? ? ? ? ? ? ? ?Anesthesia Physical ?Anesthesia Plan ? ?ASA: 4 ? ?Anesthesia Plan: MAC  ? ?Post-op Pain Management: Tylenol PO (pre-op)* and Minimal or no pain anticipated  ? ?Induction: Intravenous ? ?PONV Risk Score and Plan: 1 and Propofol infusion, TIVA, Treatment may vary due to age or medical condition and Dexamethasone ? ?Airway Management Planned: Natural Airway, Nasal Cannula and Simple Face Mask ? ?Additional Equipment: None ? ?Intra-op Plan:  ? ?Post-operative Plan:  ? ?Informed Consent: I have reviewed the patients History and Physical, chart, labs and discussed the procedure including the risks, benefits and alternatives for the proposed anesthesia with the patient or authorized representative who has  indicated his/her understanding and acceptance.  ? ? ? ? ? ?Plan Discussed with:  ? ?Anesthesia Plan Comments:   ? ? ? ? ? ? ?Anesthesia Quick Evaluation ? ?

## 2021-10-10 NOTE — Procedures (Signed)
removed 309mls net fluid.  no complications.  gave midodrine and calcitriol as ordered .  gave benadryl and zofran upon pt request for itching and nausea.  pre bp 126/73 post bp 112/43 pre weight 104.5kg post weight 101.7kg bed scales.  2 bandages to lua avf no bleeding dressing cdi.   ?

## 2021-10-10 NOTE — Progress Notes (Signed)
PT Cancellation Note ? ?Patient Details ?Name: Yogesh Cominsky ?MRN: [id number] ?DOB: 1970-09-13 ? ? ?Cancelled Treatment:    Reason Eval/Treat Not Completed: Patient at procedure or test/unavailable. Patient for surgery today. Will re-attempt tomorrow.  ? ? ?Hatsuko Bizzarro ?10/10/2021, 8:41 AM ? ? ?

## 2021-10-10 NOTE — Interval H&P Note (Signed)
History and Physical Interval Note: ? ?10/10/2021 ?6:38 AM ? ?Michael Riggs  has presented today for surgery, with the diagnosis of Right Achilles Ulcer.  The various methods of treatment have been discussed with the patient and family. After consideration of risks, benefits and other options for treatment, the patient has consented to  Procedure(s): ?RIGHT ACHILLES DEBRIDEMENT AND TISSUE GRAFT (Right) as a surgical intervention.  The patient's history has been reviewed, patient examined, no change in status, stable for surgery.  I have reviewed the patient's chart and labs.  Questions were answered to the patient's satisfaction.   ? ? ?[name] ? ? ?

## 2021-10-10 NOTE — Plan of Care (Signed)
?  Problem: Nutrition: ?Goal: Adequate nutrition will be maintained ?Outcome: Progressing ?  ?Problem: Safety: ?Goal: Ability to remain free from injury will improve ?Outcome: Progressing ?  ?Problem: Pain Managment: ?Goal: General experience of comfort will improve ?Outcome: Not Progressing ?  ?

## 2021-10-10 NOTE — Anesthesia Procedure Notes (Signed)
Procedure Name: [place] ?Date/Time: 10/10/2021 9:55 AM ?Performed by: Lorie Phenix, CRNA ?Pre-anesthesia Checklist: Patient identified, Emergency Drugs available, Suction available and Patient being monitored ?Oxygen Delivery Method: Simple face mask ?Placement Confirmation: positive ETCO2 ? ? ? ? ?

## 2021-10-10 NOTE — Progress Notes (Signed)
?[facility] ?Progress Note  ? ?Subjective:    ?Michael Riggs is a 51 y.o. male presenting on 3/20 with fall and near syncope questionable for stroke, new onset HFrEF and chronic R leg wound. PMH is significant for ESRD on HD (MWF), HTN, left BKA, HLD, T2DM, anemia of chronic disease, history of CVA, OSA. ? ?Patient seen while on dialysis today; UF of 3.5L. He underwent surgery for his foot wound earlier this AM. Denies any pain, CP, ShOB, HA, edema. ? ?Objective ?Vitals:  ? 10/10/21 1030 10/10/21 1045 10/10/21 1100 10/10/21 1126  ?BP: 117/72 114/68 114/87 118/74  ?Pulse: 67 61 62 62  ?Resp: 12 (!) 21 20 19   ?Temp: 97.8 ?F (36.6 ?C)  (!) 97.5 ?F (36.4 ?C) (!) 97.4 ?F (36.3 ?C)  ?TempSrc:    Oral  ?SpO2: 99% 92% 99% 100%  ?Weight:      ?Height:      ? ?Physical Exam ?General: Pleasant Hispanic male lying in bed, NAD ?Heart: RRR, no MRG ?Lungs: CTABL, no wheezes or rales ?Abdomen: soft, nd, nt ?Extremities: L BKA. No stump edema. RLE with drsg intact R ankle. Trace edema RLE only.   ?Dialysis Access: L AVF + T/B ? ?Additional Objective ?Labs: ?Basic Metabolic Panel: ?Recent Labs  ?Lab 10/07/21 ?0645 10/08/21 ?0130 10/09/21 ?7322 10/10/21 ?0837  ?NA 130* 132* 133* 130*  ?K 4.8 4.6 4.3 4.6  ?CL 87* 93* 92* 93*  ?CO2 24 23 26   --   ?GLUCOSE 123* 134* 132* 162*  ?BUN 58* 42* 38* 59*  ?CREATININE 9.62* 8.03* 6.86* 9.20*  ?CALCIUM 9.5 9.5 9.5  --   ?PHOS  --  4.4  --   --   ? ?Liver Function Tests: ?Recent Labs  ?Lab 10/06/21 ?1243  ?AST 19  ?ALT 15  ?ALKPHOS 92  ?BILITOT 1.5*  ?PROT 7.3  ?ALBUMIN 3.2*  ? ?CBC: ?Recent Labs  ?Lab 10/06/21 ?1243 10/07/21 ?0645 10/08/21 ?0130 10/09/21 ?0254 10/10/21 ?0037 10/10/21 ?0837  ?WBC 11.9* 11.7* 10.4 11.4* 12.5*  --   ?NEUTROABS 10.5* 9.9* 7.8* 8.0* 8.5*  --   ?HGB 13.2 13.1 13.8 13.9 13.5 15.3  ?HCT 42.1 40.0 42.7 42.5 42.4 45.0  ?MCV 103.2* 101.3* 102.2* 101.2* 101.9*  --   ?PLT 202 197 204 217 250  --   ? ?Blood Culture ?   ?Component Value Date/Time  ?  [place] BLOOD RIGHT HAND 10/07/2021 0657  ? SPECREQUEST  10/07/2021 0657  ?  BOTTLES DRAWN AEROBIC AND ANAEROBIC Blood Culture adequate volume  ? CULT  10/07/2021 0657  ?  NO GROWTH 3 DAYS ?Performed at [facility], [place] [address], [place], [place] [address] ?  ? REPTSTATUS PENDING 10/07/2021 0657  ? ?Studies/Results: ?CT ANGIO AO+BIFEM W & OR WO CONTRAST ? ?Result Date: 10/09/2021 ?CLINICAL DATA:  Claudication or leg ischemia EXAM: CT ANGIOGRAPHY OF ABDOMINAL AORTA WITH ILIOFEMORAL RUNOFF TECHNIQUE: Multidetector CT imaging of the abdomen, pelvis and lower extremities was performed using the standard protocol during bolus administration of intravenous contrast. Multiplanar CT image reconstructions and MIPs were obtained to evaluate the vascular anatomy. RADIATION DOSE REDUCTION: This exam was performed according to the departmental dose-optimization program which includes automated exposure control, adjustment of the mA and/or kV according to patient size and/or use of iterative reconstruction technique. CONTRAST:  167mL OMNIPAQUE IOHEXOL 350 MG/ML SOLN COMPARISON:  None. FINDINGS: VASCULAR Aorta: Mild atherosclerotic calcification. No hemodynamically significant stenosis. No aneurysm or dissection. No periaortic inflammatory change. Celiac: Patent without evidence  of aneurysm, dissection, vasculitis or significant stenosis. SMA: Patent without evidence of aneurysm, dissection, vasculitis or significant stenosis. Renals: The right renal artery is diffusely narrowed without focal stenosis identified. No dissection or aneurysm. The left renal artery is widely patent without evidence of aneurysm or dissection. IMA: Patent without evidence of aneurysm, dissection, vasculitis or significant stenosis. RIGHT Lower Extremity Inflow: Common, internal and external iliac arteries are patent without evidence of aneurysm, dissection, vasculitis or significant stenosis. Outflow: The common femoral artery and profundus femoral  artery are widely patent. The superficial femoral artery demonstrates extensive arteriosclerosis with a 50% stenosis in its mid segment, axial image # 209/6 and a focal 50-75% stenosis at the adductor hiatus. Runoff: Focal 50% stenosis of the P2 segment of the popliteal artery, axial image # 289/6. 50% stenosis of the anterior tibial artery at its origin, axial image # 321/6. 50-75% stenosis of the tibioperoneal trunk at its origin, axial image # 323/6. Three-vessel runoff to the ankle. Dorsalis pedis artery and plantar arch are patent. LEFT Lower Extremity Inflow: Common, internal and external iliac arteries are patent without evidence of aneurysm, dissection, vasculitis or significant stenosis. Outflow/runoff: Common femoral artery demonstrates a less than 50% stenosis. Profundus femoral artery demonstrates a focal 50% stenosis in its proximal segment, axial image # 183/6. Superficial femoral artery is occluded just beyond the adductor hiatus. Popliteal artery is occluded. Gastrocnemius arteries are collateralized from the profundus femoral artery. Proximal runoff vessels are occluded. Below-knee amputation has been performed. Veins: No obvious venous abnormality within the limitations of this arterial phase study. Review of the MIP images confirms the above findings. NON-VASCULAR Lower chest: Consolidation within the visualized left lower lobe may reflect changes of rounded atelectasis. Small to moderate left pleural effusion is present with associated pleural thickening. Cardiac size is enlarged. Extensive coronary artery calcification. Hepatobiliary: Cholelithiasis without pericholecystic inflammatory change. Liver unremarkable. Pancreas: Unremarkable Spleen: Unremarkable Adrenals/Urinary Tract: Adrenal glands are unremarkable. Kidneys are normal, without renal calculi, focal lesion, or hydronephrosis. Bladder is unremarkable. Stomach/Bowel: Moderate stool throughout the colon. The stomach, small bowel, and  large bowel are otherwise unremarkable. Appendix normal. No free intraperitoneal gas or fluid. Lymphatic: Shotty retroperitoneal adenopathy is present similar appearance to remote prior examination 10/12/2019. No pathologic adenopathy within the abdomen and pelvis. Reproductive: Prostate is unremarkable. Other: No abdominal wall hernia Musculoskeletal: No acute bone abnormality. IMPRESSION: VASCULAR Advanced lower extremity arteriosclerosis. Serial hemodynamically significant stenoses within the right lower extremity arterial outflow involving the mid and distal right superficial femoral artery. Serial stenoses of the right lower extremity arterial runoff with hemodynamically significant stenoses of the P2 segment of the popliteal artery, origin of the anterior tibial artery, and origin of the tibioperoneal trunk. Preserved three-vessel runoff to the right ankle with patency of the dorsalis pedis artery and plantar arch. Left below-knee amputation. Occlusion of the distal left superficial femoral artery, popliteal artery, and residual proximal runoff vasculature. Focal 50% stenosis of the profundus femoral artery which provides collateral supply to multiple gastrocnemius arteries of the residual left lower extremity. NON-VASCULAR Complex appearing left pleural effusion demonstrating pleural thickening. Probable rounded atelectasis within the visualized left lower lobe. Extensive coronary artery calcification.  Mild cardiomegaly. Cholelithiasis. Moderate stool burden without evidence of obstruction. Aortic Atherosclerosis (ICD10-I70.0). Electronically Signed   By: Fidela Salisbury M.D.   On: 10/09/2021 03:28   ?Medications: ? sodium chloride    ? [MAR Hold] ceFEPime (MAXIPIME) IV 1 g (10/09/21 1719)  ? methocarbamol (ROBAXIN) IV    ? ? [  MAR Hold] (feeding supplement) PROSource Plus  30 mL Oral BID BM  ? [MAR Hold] aspirin EC  81 mg Oral Daily  ? [MAR Hold] atorvastatin  40 mg Oral QHS  ? [MAR Hold] buPROPion  300 mg  Oral Daily  ? [MAR Hold] calcitRIOL  2 mcg Oral Q M,W,F-HD  ? [MAR Hold] Chlorhexidine Gluconate Cloth  6 each Topical Q0600  ? docusate sodium  100 mg Oral BID  ? [MAR Hold] ferric citrate  630 mg Oral TID

## 2021-10-10 NOTE — Progress Notes (Signed)
Family Medicine Teaching Service ?Daily Progress Note ?Intern Pager: [phone] ? ?Patient name: Michael Riggs Medical record number: [id number] ?Date of birth: 21-Jun-1971 Age: 51 y.o. Gender: male ? ?Primary Care Provider: [name], MD ?Consultants: Nephrology, orthopedic surgery, vascular surgery, wound care, neurology (S/O), cardiology (S/O), podiatry (S/O) ?Code Status: Full ? ?Pt Overview and Major Events to Date:  ?3/20: Admitted ?3/24: Wound debridement ? ?Assessment and Plan: ?Michael Riggs is a 51 y.o. male presenting with fall and near syncope questionable for stroke, new onset HFrEF and chronic R leg wound. PMH is significant for ESRD on HD, HTN, left BKA, HLD, T2DM, anemia of chronic disease, history of CVA, OSA. ? ?Infected chronic right leg wound ?WBC and ANC minimally worsened from 11.4 to 12.5 and 8.0 to 8.5, respectively. Has remained afebrile throughout hospitalization. Ortho to perform wound debridement today with wound VAC and biologic tissue graft.  Blood culture shows no growth x3 days.  Patient required Tylenol twice yesterday and Oxy once yesterday.  Vascular surgery planning for RLE arteriogram early next week. Unable to speak with patient this AM. Will have team follow up with him when returned to room. Tissue sampling was taken during debridement. Will tailor antibiotics to results. ?-Orthopedic surgery following, appreciate recommendations ?-Vascular surgery following, appreciate recommendations ?-Continue cefepime (3/20-), adjust according to cultures from tissue sampling per orthopedic surgery request ?-A.m. CBC with ?-Tylenol 1000 mg every 6 hours as needed, Oxy IR 5 mg every 6 hours as needed ? ?Fall with near syncope ?No cause thus far however may be correlated to hypotension, worsening heart failure. ?-PT/OT eval and treat ? ?History of CVA ?-Continue aspirin 81 mg daily Lipitor 40 mg daily ?-Consider Plavix after procedures complete for DAPT recommendations by  neurology ? ?New onset HFrEF ?Cardiology advised outpatient follow-up which is scheduled for 10/20/2021. ? ?ESRD on HD MWF ?Dialysis today. ?-Nephrology following, appreciate recommendations ?-Midodrine 10 mg for dialysis ?-A.m. BMP ? ?FEN/GI: Heart healthy ?PPx: Heparin SQ every 8 hours ?Dispo: pending clinical improvement . Barriers include surgical debridement and Ortho/vascular surgery treatment plan.  ? ?Subjective:  ?Unable to assess this AM given surgical debridement. ? ?Objective: ?Temp:  [97.8 ?F (36.6 ?C)-98.9 ?F (37.2 ?C)] 97.8 ?F (36.6 ?C) (03/24 9833) ?Pulse Rate:  [72-79] 72 (03/24 0521) ?Resp:  [16-19] 18 (03/24 0521) ?BP: (130-166)/(80-98) 157/86 (03/24 0521) ?SpO2:  [96 %-100 %] 98 % (03/24 0521) ?FiO2 (%):  [21 %] 21 % (03/23 2149) ?Physical Exam: ?Unable to perform as patient was in surgical debridement with orthopedic surgery. ? ?Laboratory: ?Recent Labs  ?Lab 10/08/21 ?0130 10/09/21 ?8250 10/10/21 ?0037  ?WBC 10.4 11.4* 12.5*  ?HGB 13.8 13.9 13.5  ?HCT 42.7 42.5 42.4  ?PLT 204 217 250  ? ?Recent Labs  ?Lab 10/06/21 ?1243 10/07/21 ?0645 10/08/21 ?0130 10/09/21 ?5397  ?NA 133* 130* 132* 133*  ?K 4.7 4.8 4.6 4.3  ?CL 91* 87* 93* 92*  ?CO2 29 24 23 26   ?BUN 45* 58* 42* 38*  ?CREATININE 8.17* 9.62* 8.03* 6.86*  ?CALCIUM 9.0 9.5 9.5 9.5  ?PROT 7.3  --   --   --   ?BILITOT 1.5*  --   --   --   ?ALKPHOS 92  --   --   --   ?ALT 15  --   --   --   ?AST 19  --   --   --   ?GLUCOSE 118* 123* 134* 132*  ? ?Imaging/Diagnostic Tests: ?No results found. ? ?Wells Guiles, DO ?  10/10/2021, 7:12 AM ?PGY-1, [place] ?[place] Intern pager: [phone], text pages welcome ? ?

## 2021-10-11 DIAGNOSIS — S91309A Unspecified open wound, unspecified foot, initial encounter: Secondary | ICD-10-CM | POA: Diagnosis not present

## 2021-10-11 LAB — CBC WITH DIFFERENTIAL/PLATELET
Abs Immature Granulocytes: 0.57 10*3/uL — ABNORMAL HIGH (ref 0.00–0.07)
Basophils Absolute: 0.1 10*3/uL (ref 0.0–0.1)
Basophils Relative: 1 %
Eosinophils Absolute: 0.2 10*3/uL (ref 0.0–0.5)
Eosinophils Relative: 2 %
HCT: 44.8 % (ref 39.0–52.0)
Hemoglobin: 14.1 g/dL (ref 13.0–17.0)
Immature Granulocytes: 5 %
Lymphocytes Relative: 8 %
Lymphs Abs: 0.9 10*3/uL (ref 0.7–4.0)
MCH: 32.4 pg (ref 26.0–34.0)
MCHC: 31.5 g/dL (ref 30.0–36.0)
MCV: 103 fL — ABNORMAL HIGH (ref 80.0–100.0)
Monocytes Absolute: 1.1 10*3/uL — ABNORMAL HIGH (ref 0.1–1.0)
Monocytes Relative: 10 %
Neutro Abs: 8.3 10*3/uL — ABNORMAL HIGH (ref 1.7–7.7)
Neutrophils Relative %: 74 %
Platelets: 257 10*3/uL (ref 150–400)
RBC: 4.35 MIL/uL (ref 4.22–5.81)
RDW: 16.4 % — ABNORMAL HIGH (ref 11.5–15.5)
WBC: 11.1 10*3/uL — ABNORMAL HIGH (ref 4.0–10.5)
nRBC: 0 % (ref 0.0–0.2)

## 2021-10-11 LAB — BASIC METABOLIC PANEL
Anion gap: 14 (ref 5–15)
BUN: 43 mg/dL — ABNORMAL HIGH (ref 6–20)
CO2: 24 mmol/L (ref 22–32)
Calcium: 9.3 mg/dL (ref 8.9–10.3)
Chloride: 93 mmol/L — ABNORMAL LOW (ref 98–111)
Creatinine, Ser: 7.15 mg/dL — ABNORMAL HIGH (ref 0.61–1.24)
GFR, Estimated: 9 mL/min — ABNORMAL LOW (ref >60)
Glucose, Bld: 154 mg/dL — ABNORMAL HIGH (ref 70–99)
Potassium: 4.5 mmol/L (ref 3.5–5.1)
Sodium: 131 mmol/L — ABNORMAL LOW (ref 135–145)

## 2021-10-11 MED ORDER — OXYCODONE HCL 5 MG PO TABS
5.0000 mg | ORAL_TABLET | Freq: Four times a day (QID) | ORAL | Status: DC | PRN
Start: 2021-10-11 — End: 2021-10-16
  Administered 2021-10-12 – 2021-10-13 (×2): 5 mg via ORAL
  Filled 2021-10-11 (×2): qty 1

## 2021-10-11 MED ORDER — ACETAMINOPHEN 500 MG PO TABS
1000.0000 mg | ORAL_TABLET | Freq: Four times a day (QID) | ORAL | Status: DC | PRN
  Administered 2021-10-11 – 2021-10-14 (×2): 1000 mg via ORAL
  Filled 2021-10-11 (×2): qty 2

## 2021-10-11 NOTE — Progress Notes (Signed)
FPTS Brief Progress Note ? ?S:Patient resting comfortably at bedside. Voiced no concerns at this time. ? ? ?O: ?BP (!) 142/75 (BP Location: Right Arm)   Pulse 71   Temp 98.6 ?F (37 ?C) (Oral)   Resp 16   Ht 5\' 10"  (1.778 m)   Wt 101.7 kg   SpO2 96%   BMI 32.17 kg/m?   ? ? ?A/P: ?-Plan per day team ?- Orders reviewed. Labs for AM ordered, which was adjusted as needed.  ?- If condition changes, plan includes bedside evaluation.  ? ?France Ravens, MD ?10/11/2021, 5:15 AM ?PGY-1, [place] Night Resident  ?Please page [phone] with questions.  ? ?

## 2021-10-11 NOTE — Progress Notes (Signed)
Family Medicine Teaching Service ?Daily Progress Note ?Intern Pager: [phone] ? ?Patient name: Michael Riggs Medical record number: [id number] ?Date of birth: 24-Aug-1970 Age: 51 y.o. Gender: male ? ?Primary Care Provider: [name], MD ?Consultants: Nephrology, orthopedic surgery, vascular surgery, wound care, neurology (S/O), cardiology (S/O), podiatry (S/O) ?Code Status: Full ? ?Pt Overview and Major Events to Date:  ?3/20: admitted ?3/24: wound debridement ? ?Assessment and Plan: ?Tarl Cephas is a 51 y.o. male presenting with fall and near syncope questionable for stroke, new onset HFrEF and chronic R leg wound. PMH is significant for ESRD on HD, HTN, left BKA, HLD, T2DM, anemia of chronic disease, history of CVA, OSA. ? ?Infected chronic right leg wound ?WBC and ANC essentially unchanged from 12.5-11.1 and 8.5-8.3, respectively.  Continues to remain afebrile.  Orthopedic surgery performed wound debridement yesterday with pending tissue cultures.  Blood cultures with no growth x3 days.  Patient has not required any pain medications since 8 PM last night.  Scheduled for abdominal aortogram on 3/28 at 830.  Pain well controlled will adjust medications accordingly ?-Orthopedic surgery following, appreciate recommendations ?-Vascular surgery following, appreciate recommendations ?-Continue cefepime (3/20-), adjust according to cultures from tissue sampling per orthopedic surgery ?-A.m. CBC with differential ?-Tylenol 1000 mg every 6 hours as needed, Oxy IR 5 mg every 6 hours as needed ? ?ESRD on HD MWF ?Dialysis yesterday. ?-Nephrology following, appreciate recommendations ?-Midodrine 10 mg for dialysis ?-A.m. BMP ? ?Fall with near syncope ?Continues to be hemodynamically stable and without syncopal events during hospitalization.  Given patient's extensive medication regimen, very likely influenced by sedating medications possibly in combination with CVA or new onset systolic CHF. ?-PT/OT eval and  treat ? ?History of CVA ?-Continue aspirin 81 mg daily Lipitor 40 mg daily ?-Consider Plavix after procedures complete for DAPT recommendations by neurology ?  ?New onset HFrEF ?Cardiology advised outpatient follow-up which is scheduled for 10/20/2021. ? ?FEN/GI: Heart healthy ?PPx: Heparin subcu every 8 hours ?Dispo: pending clinical improvement . Barriers include continued work-up by orthopedic and vascular surgery.  ? ?Subjective:  ?Patient states he is happy with the outcome of the surgery and is hopeful that the aortogram on Monday/Tuesday will have good results and he is able to keep his leg.  Endorses good pain control at this time. ? ?Objective: ?Temp:  [97.1 ?F (36.2 ?C)-98.6 ?F (37 ?C)] 98.2 ?F (36.8 ?C) (03/25 0515) ?Pulse Rate:  [60-85] 85 (03/25 0745) ?Resp:  [11-21] 17 (03/25 0745) ?BP: (111-158)/(43-94) 158/94 (03/25 0745) ?SpO2:  [92 %-100 %] 96 % (03/25 0745) ?FiO2 (%):  [21 %] 21 % (03/25 0515) ?Weight:  [101.7 kg-104.5 kg] 101.7 kg (03/24 1521) ?Physical Exam: ?General: Awake, alert, NAD sitting on chair ?Cardiovascular: RRR, no murmurs auscultated ?Respiratory: CTA B, no increased work of breathing ?Extremities: Left BKA, right leg wound wrapped ? ?Laboratory: ?Recent Labs  ?Lab 10/09/21 ?5271 10/10/21 ?0037 10/10/21 ?2929 10/11/21 ?0615  ?WBC 11.4* 12.5*  --  11.1*  ?HGB 13.9 13.5 15.3 14.1  ?HCT 42.5 42.4 45.0 44.8  ?PLT 217 250  --  257  ? ?Recent Labs  ?Lab 10/06/21 ?1243 10/07/21 ?0645 10/08/21 ?0130 10/09/21 ?0903 10/10/21 ?0149 10/11/21 ?0615  ?NA 133*   < > 132* 133* 130* 131*  ?K 4.7   < > 4.6 4.3 4.6 4.5  ?CL 91*   < > 93* 92* 93* 93*  ?CO2 29   < > 23 26  --  24  ?BUN 45*   < > 42*  38* 59* 43*  ?CREATININE 8.17*   < > 8.03* 6.86* 9.20* 7.15*  ?CALCIUM 9.0   < > 9.5 9.5  --  9.3  ?PROT 7.3  --   --   --   --   --   ?BILITOT 1.5*  --   --   --   --   --   ?ALKPHOS 92  --   --   --   --   --   ?ALT 15  --   --   --   --   --   ?AST 19  --   --   --   --   --   ?GLUCOSE 118*   < > 134* 132*  162* 154*  ? < > = values in this interval not displayed.  ? ?Imaging/Diagnostic Tests: ?No results found. ? ?Wells Guiles, DO ?10/11/2021, 9:00 AM ?PGY-1, [facility] ?[place] Intern pager: [phone], text pages welcome ? ?

## 2021-10-11 NOTE — Progress Notes (Signed)
?[facility] ?Progress Note  ? ?Subjective:   Seen in room this AM, sitting in recliner. S/p RIGHT achilles debridement with tissue graft on 3/24. Reports all went well. Tells me plan is for RLE vascular intervention this admit as well. Denies CP/dyspnea. 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 ? ?Objective ?Vitals:  ? 10/10/21 2002 10/11/21 0004 10/11/21 0515 10/11/21 0745  ?BP: (!) 142/75  (!) 152/80 (!) 158/94  ?Pulse: 72 71 72 85  ?Resp: 17 16 16 17   ?Temp: 98.6 ?F (37 ?C)  98.2 ?F (36.8 ?C)   ?TempSrc: Oral  Oral   ?SpO2: 100% 96% 98% 96%  ?Weight:      ?Height:      ? ?Physical Exam ?General: Well appearing, NAD. Room air. ?Heart: RRR, no murmur ?Lungs: CTA anteriorly, no wheezing/rales ?Abdomen: soft ?Extremities: L BKA without stump edema. RLE in large soft boot with wound vac.  ?Dialysis Access: L AVF + thrill ? ?Additional Objective ?Labs: ?Basic Metabolic Panel: ?Recent Labs  ?Lab 10/08/21 ?0130 10/09/21 ?2542 10/10/21 ?7062 10/11/21 ?0615  ?NA 132* 133* 130* 131*  ?K 4.6 4.3 4.6 4.5  ?CL 93* 92* 93* 93*  ?CO2 23 26  --  24  ?GLUCOSE 134* 132* 162* 154*  ?BUN 42* 38* 59* 43*  ?CREATININE 8.03* 6.86* 9.20* 7.15*  ?CALCIUM 9.5 9.5  --  9.3  ?PHOS 4.4  --   --   --   ? ?Liver Function Tests: ?Recent Labs  ?Lab 10/06/21 ?1243  ?AST 19  ?ALT 15  ?ALKPHOS 92  ?BILITOT 1.5*  ?PROT 7.3  ?ALBUMIN 3.2*  ? ?CBC: ?Recent Labs  ?Lab 10/07/21 ?0645 10/08/21 ?0130 10/09/21 ?3762 10/10/21 ?0037 10/10/21 ?8315 10/11/21 ?0615  ?WBC 11.7* 10.4 11.4* 12.5*  --  11.1*  ?NEUTROABS 9.9* 7.8* 8.0* 8.5*  --  8.3*  ?HGB 13.1 13.8 13.9 13.5 15.3 14.1  ?HCT 40.0 42.7 42.5 42.4 45.0 44.8  ?MCV 101.3* 102.2* 101.2* 101.9*  --  103.0*  ?PLT 197 204 217 250  --  257  ? ?Medications: ? sodium chloride    ? ceFEPime (MAXIPIME) IV 1 g (10/10/21 1852)  ? methocarbamol (ROBAXIN) IV    ? ? (feeding supplement) PROSource Plus  30 mL Oral BID BM  ? aspirin EC  81 mg Oral Daily   ? atorvastatin  40 mg Oral QHS  ? buPROPion  300 mg Oral Daily  ? calcitRIOL  2 mcg Oral Q M,W,F-HD  ? Chlorhexidine Gluconate Cloth  6 each Topical Q0600  ? docusate sodium  100 mg Oral BID  ? ferric citrate  630 mg Oral TID WC  ? heparin injection (subcutaneous)  5,000 Units Subcutaneous Q8H  ? metoprolol tartrate  25 mg Oral BID  ? midodrine  10 mg Oral Q M,W,F-HD  ? multivitamin  1 tablet Oral QHS  ? pantoprazole  40 mg Oral QHS  ? ramelteon  8 mg Oral QHS  ? tamsulosin  0.4 mg Oral QHS  ? ? ?Dialysis Orders: ?OP HD: MWF  ? 5h  400/500  99.5kg   2/2 bath   LUA AVF   ?-No heparin Heparin none ?-no esa/ IV fe ? - rocaltrol 2.0 tiw po ?  ?Assessment/Plan: ?Infection/Nonhealing wound  R ankle: IV cefepime, per primary. S/p right achilles debridement and tissue graft 3/24 with ortho. Wound vac in place. Tissue sample collected for culture; primary team to adjust abx according to  cx results. At high risk for limb loss. VVS also following - with plan for revascularization RLE this admit.  ?Combined systolic/diastolic HF: EF 36% U4QI per echo 10/07/2021. Cardiology consulted. No evidence of excess volume by exam. Optimize volume with HD. Hx high IDWG. ?ESRD (HD MWF): Continue usual MWF schedule - next 3/27. ?HTN/volume: BP slightly high, continue to UF as tolerated.   ?Anemia of CKD: Hgb >12. 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 PO4 at goal. Cont binder, vdra  ?Nutrition: Albumin low, continue supplements. ?Hx L BKA ?Hx severe calciphylaxis (resolved/cured) ? ?Veneta Penton, PA-C ?10/11/2021, 10:43 AM  ?[facility] ? ? ? ?

## 2021-10-11 NOTE — Progress Notes (Signed)
Occupational Therapy Treatment ?Patient Details ?Name: Michael Riggs ?MRN: [id number] ?DOB: January 05, 1971 ?Today's Date: 10/11/2021 ? ? ?History of present illness Pt is a 50 y/o M presenting to ED for facial and L knee pain after fall/near syncopal episode. He is now s/p R achilles debridement and tissue graft with application of wound VAC on 10/10/2021. PMH includes L BKA, ESRD on HD, HTN, DM2, CVA and anemia of chronic disease. ?  ?OT comments ? Pt with new precautions s/p Achilles debridement and tissue graft, NWB RLE. Pt able to transfer bed <> drop arm recliner with use of transfer board with min guard A. Educated pt on updated precautions, verbalizes understanding and is able to adhere to them throughout session, benefits from therapist holding pt's RLE during transfers to ensure NWB. Discussed with RN, requesting drop arm BSC for pt's room to practice with next session. Pt presenting with impairments listed below, will follow acutely. Continue to recommend HHOT at d/c.  ? ?Recommendations for follow up therapy are one component of a multi-disciplinary discharge planning process, led by the attending physician.  Recommendations may be updated based on patient status, additional functional criteria and insurance authorization. ?   ?Follow Up Recommendations ? Home health OT  ?  ?Assistance Recommended at Discharge Intermittent Supervision/Assistance  ?Patient can return home with the following ? A little help with walking and/or transfers;A little help with bathing/dressing/bathroom ?  ?Equipment Recommendations ? BSC/3in1;Other (comment) (drop arm)  ?  ?Recommendations for Other Services PT consult ? ?  ?Precautions / Restrictions Precautions ?Precautions: Fall ?Precaution Comments: L BKA at baseline ?Required Braces or Orthoses: Other Brace ?Other Brace: Podus boot (light gray PRAFO without kickstand) on RLE ?Restrictions ?Weight Bearing Restrictions: Yes ?RLE Weight Bearing: Non weight bearing ?LLE Weight  Bearing: Non weight bearing ?Other Position/Activity Restrictions: RLE NWB as of 3/24 per Dr. Sharol Given  ? ? ?  ? ?Mobility Bed Mobility ?  ?  ?  ?  ?  ?  ?  ?General bed mobility comments: pt up in chair upon arrival ?  ? ?Transfers ?Overall transfer level: Needs assistance ?Equipment used: Sliding board ?Transfers: Bed to chair/wheelchair/BSC ?  ?  ?  ?  ?  ? Lateral/Scoot Transfers: Min guard ?General transfer comment: close guard, at times therapist holding pt's foot to ensure NWB ?  ?  ?Balance Overall balance assessment: Needs assistance ?Sitting-balance support: Feet supported ?Sitting balance-Leahy Scale: Good ?  ?  ?  ?  ?Standing balance comment: unable to attempt at this time due to precautions ?  ?  ?  ?  ?  ?  ?  ?  ?  ?  ?  ?   ? ?ADL either performed or assessed with clinical judgement  ? ?ADL Overall ADL's : Needs assistance/impaired ?  ?  ?  ?  ?Upper Body Bathing: Set up;Sitting ?Upper Body Bathing Details (indicate cue type and reason): set up A for UB bathing at end of session ?  ?  ?  ?  ?  ?  ?Toilet Transfer: Engineer, maintenance;Requires drop arm ?Toilet Transfer Details (indicate cue type and reason): simulated chair <> bed ?  ?  ?  ?  ?Functional mobility during ADLs: Min guard ?  ?  ? ?Extremity/Trunk Assessment Upper Extremity Assessment ?Upper Extremity Assessment: Overall WFL for tasks assessed ?  ?Lower Extremity Assessment ?Lower Extremity Assessment: Defer to PT evaluation ?  ?  ?  ? ?Vision   ?Vision Assessment?: No apparent visual  deficits ?  ?Perception Perception ?Perception: Not tested ?  ?Praxis Praxis ?Praxis: Not tested ?  ? ?Cognition Arousal/Alertness: Awake/alert ?Behavior During Therapy: Impulsive ?Overall Cognitive Status: History of cognitive impairments - at baseline ?  ?  ?  ?  ?  ?  ?  ?  ?  ?  ?  ?  ?  ?  ?  ?  ?General Comments: Pt endorses cognitive changes since CVA ?  ?  ?   ?Exercises   ? ?  ?Shoulder Instructions   ? ? ?  ?General Comments VSS on RA   ? ? ?Pertinent Vitals/ Pain       Pain Assessment ?Pain Assessment: No/denies pain ? ?Home Living   ?  ?  ?  ?  ?  ?  ?  ?  ?  ?  ?  ?  ?  ?  ?  ?  ?  ?  ? ?  ?Prior Functioning/Environment    ?  ?  ?  ?   ? ?Frequency ? Min 2X/week  ? ? ? ? ?  ?Progress Toward Goals ? ?OT Goals(current goals can now be found in the care plan section) ? Progress towards OT goals: Progressing toward goals ? ?Acute Rehab OT Goals ?Patient Stated Goal: none stated ?OT Goal Formulation: With patient ?Time For Goal Achievement: 10/21/21 ?Potential to Achieve Goals: Good ?ADL Goals ?Pt Will Perform Upper Body Dressing: with modified independence;sitting ?Pt Will Perform Lower Body Dressing: with min guard assist;sitting/lateral leans ?Pt Will Transfer to Toilet: bedside commode;squat pivot transfer;with min guard assist ?Pt Will Perform Tub/Shower Transfer: Shower transfer;Tub transfer;rolling walker;ambulating;shower seat;Stand pivot transfer  ?Plan Discharge plan remains appropriate   ? ?Co-evaluation ? ? ? PT/OT/SLP Co-Evaluation/Treatment: Yes ?Reason for Co-Treatment: Complexity of the patient's impairments (multi-system involvement);For patient/therapist safety;To address functional/ADL transfers ?  ?OT goals addressed during session: ADL's and self-care;Strengthening/ROM ?  ? ?  ?AM-PAC OT "6 Clicks" Daily Activity     ?Outcome Measure ? ? Help from another person eating meals?: None ?Help from another person taking care of personal grooming?: A Little ?Help from another person toileting, which includes using toliet, bedpan, or urinal?: A Little ?Help from another person bathing (including washing, rinsing, drying)?: A Little ?Help from another person to put on and taking off regular upper body clothing?: A Little ?Help from another person to put on and taking off regular lower body clothing?: A Lot ?6 Click Score: 18 ? ?  ?End of Session   ? ?OT Visit Diagnosis: Unsteadiness on feet (R26.81);Other abnormalities of gait and  mobility (R26.89);Muscle weakness (generalized) (M62.81) ?  ?Activity Tolerance Patient tolerated treatment well ?  ?Patient Left in chair;with call bell/phone within reach;with chair alarm set ?  ?Nurse Communication Mobility status;Other (comment) (asked RN/nurse secretary to place drop arm BSC in pt's room) ?  ? ?   ? ?Time: 8921-1941 ?OT Time Calculation (min): 25 min ? ?Charges: OT General Charges ?$OT Visit: 1 Visit ?OT Treatments ?$Self Care/Home Management : 8-22 mins ? ?Lynnda Child, OTD, OTR/L ?Acute Rehab ?[phone] ? ? ?Kaylyn Lim ?10/11/2021, 12:33 PM ?

## 2021-10-11 NOTE — Plan of Care (Signed)
  Problem: Pain Managment: Goal: General experience of comfort will improve Outcome: Progressing   Problem: Safety: Goal: Ability to remain free from injury will improve Outcome: Progressing   Problem: Skin Integrity: Goal: Risk for impaired skin integrity will decrease Outcome: Progressing   Problem: Coping: Goal: Level of anxiety will decrease Outcome: Progressing   

## 2021-10-11 NOTE — Progress Notes (Signed)
Physical Therapy Treatment ?Patient Details ?Name: Michael Riggs ?MRN: [id number] ?DOB: 07/04/1971 ?Today's Date: 10/11/2021 ? ? ?History of Present Illness Pt is a 51 y/o M presenting to ED for facial and L knee pain after fall/near syncopal episode. He is now s/p R achilles debridement and tissue graft with application of wound VAC on 10/10/2021. PMH includes L BKA, ESRD on HD, HTN, DM2, CVA and anemia of chronic disease. ? ?  ?PT Comments  ? ? Pt reassessed s/p achilles debridement and application of wound VAC. Seen in conjunction with OT as pt with new NWB status on the RLE and history of L BKA. Pt now reporting that his brother utilizes a lift with him at home, however is interested in the slide board to assist with transfers to/from the wheelchair. Overall pt impulsive and with decreased awareness of safety and deficits. Recommending HHPT to follow up with pt to assess home set up and reinforce precautions and safety with mobility. Will continue to follow.  ?  ?Recommendations for follow up therapy are one component of a multi-disciplinary discharge planning process, led by the attending physician.  Recommendations may be updated based on patient status, additional functional criteria and insurance authorization. ? ?Follow Up Recommendations ? Home health PT ?  ?  ?Assistance Recommended at Discharge Set up Supervision/Assistance  ?Patient can return home with the following A little help with walking and/or transfers;A little help with bathing/dressing/bathroom;Assistance with cooking/housework;Assist for transportation ?  ?Equipment Recommendations ? None recommended by PT  ?  ?Recommendations for Other Services   ? ? ?  ?Precautions / Restrictions Precautions ?Precautions: Fall ?Precaution Comments: L BKA at baseline ?Required Braces or Orthoses: Other Brace ?Other Brace: Podus boot (light gray PRAFO without kickstand) ?Restrictions ?Weight Bearing Restrictions: Yes ?RLE Weight Bearing: Weight bearing as  tolerated ?LLE Weight Bearing: Non weight bearing  ?  ? ?Mobility ? Bed Mobility ?  ?  ?  ?  ?  ?  ?  ?General bed mobility comments: Pt was received sitting up in the recliner chair. ?  ? ?Transfers ?Overall transfer level: Needs assistance ?Equipment used: Sliding board ?Transfers: Bed to chair/wheelchair/BSC ?  ?  ?  ?  ?  ? Lateral/Scoot Transfers: Min guard ?General transfer comment: Close guard as pt transitioned to bed and then back to drop arm recliner with the slide board. ?  ? ?Ambulation/Gait ?  ?  ?  ?  ?  ?  ?  ?General Gait Details: Unable ? ? ?Stairs ?  ?  ?  ?  ?  ? ? ?Wheelchair Mobility ?  ? ?Modified Rankin (Stroke Patients Only) ?  ? ? ?  ?Balance Overall balance assessment: Needs assistance ?Sitting-balance support: Feet supported ?Sitting balance-Leahy Scale: Good ?Sitting balance - Comments: Able to donn prothesis in sitting and maintain balance ?  ?  ?Standing balance-Leahy Scale: Poor ?Standing balance comment: Requires support to maintain balance, unable to able full static or dynamic standing balance. ?  ?  ?  ?  ?  ?  ?  ?  ?  ?  ?  ?  ? ?  ?Cognition Arousal/Alertness: Awake/alert ?Behavior During Therapy: Impulsive ?Overall Cognitive Status: History of cognitive impairments - at baseline ?  ?  ?  ?  ?  ?  ?  ?  ?  ?  ?  ?  ?  ?  ?  ?  ?General Comments: Pt endorses cognitive changes since CVA ?  ?  ? ?  ?  Exercises General Exercises - Lower Extremity ?Long Arc Quad: 10 reps ? ?  ?General Comments   ?  ?  ? ?Pertinent Vitals/Pain Pain Assessment ?Pain Assessment: Faces ?Faces Pain Scale: No hurt ?Pain Intervention(s): Monitored during session  ? ? ?Home Living   ?  ?  ?  ?  ?  ?  ?  ?  ?  ?   ?  ?Prior Function    ?  ?  ?   ? ?PT Goals (current goals can now be found in the care plan section) Acute Rehab PT Goals ?Patient Stated Goal: To get back to walking and return home. ?PT Goal Formulation: With patient ?Time For Goal Achievement: 10/22/21 ?Potential to Achieve Goals: Good ?Progress  towards PT goals: Progressing toward goals ? ?  ?Frequency ? ? ? Min 3X/week ? ? ? ?  ?PT Plan Current plan remains appropriate  ? ? ?Co-evaluation PT/OT/SLP Co-Evaluation/Treatment: Yes ?Reason for Co-Treatment: Complexity of the patient's impairments (multi-system involvement);Necessary to address cognition/behavior during functional activity;For patient/therapist safety;To address functional/ADL transfers ?  ?  ?  ? ?  ?AM-PAC PT "6 Clicks" Mobility   ?Outcome Measure ? Help needed turning from your back to your side while in a flat bed without using bedrails?: None ?Help needed moving from lying on your back to sitting on the side of a flat bed without using bedrails?: A Little ?Help needed moving to and from a bed to a chair (including a wheelchair)?: A Little ?Help needed standing up from a chair using your arms (e.g., wheelchair or bedside chair)?: A Little ?Help needed to walk in hospital room?: A Lot ?Help needed climbing 3-5 steps with a railing? : A Lot ?6 Click Score: 17 ? ?  ?End of Session Equipment Utilized During Treatment:  (Prosthesis) ?Activity Tolerance: Patient tolerated treatment well ?Patient left: in chair;with call bell/phone within reach;with chair alarm set ?Nurse Communication: Mobility status ?PT Visit Diagnosis: Unsteadiness on feet (R26.81);Other abnormalities of gait and mobility (R26.89);Repeated falls (R29.6) ?  ? ? ?Time: 3557-3220 ?PT Time Calculation (min) (ACUTE ONLY): 25 min ? ?Charges:  $Therapeutic Activity: 8-22 mins          ?          ? ?Rolinda Roan, PT, DPT ?Acute Rehabilitation Services ?Pager: [phone] ?Office: [phone]  ? ? ?Thelma Comp ?10/11/2021, 11:59 AM ? ?

## 2021-10-11 NOTE — Progress Notes (Signed)
FPTS Brief Progress Note ? ?S:Patient sleeping, did not disturb ? ? ?O: ?BP 140/65 (BP Location: Right Arm)   Pulse 68   Temp 97.9 ?F (36.6 ?C) (Oral)   Resp 17   Ht 5\' 10"  (1.778 m)   Wt 101.7 kg   SpO2 99%   BMI 32.17 kg/m?   ?General: NAD, sleeping comfortably in bed ?Respiratory: breathing comfortably on room air, no increased WOB ?Extremities: Left BKA, right leg with wrapped wounds ? ?A/P: ?Infected chronic R leg wound ?- Orders reviewed. Labs for AM ordered, which was adjusted as needed.  ?- Orthopedic and vascular surgery following ?- Following wound cultures ?- Plan per daily progress note unchanged ? ?Rise Patience, DO ?10/11/2021, 9:37 PM ?PGY-2, [place] Night Resident  ?Please page [phone] with questions.  ? ? ?

## 2021-10-11 NOTE — Progress Notes (Signed)
PT awake and alert. Pt wants one night break from wearing Cpap. No resp distress noted. Will continue to monitor.  ?

## 2021-10-12 DIAGNOSIS — S91309A Unspecified open wound, unspecified foot, initial encounter: Secondary | ICD-10-CM | POA: Diagnosis not present

## 2021-10-12 LAB — CULTURE, BLOOD (ROUTINE X 2)
Culture: NO GROWTH
Special Requests: ADEQUATE

## 2021-10-12 LAB — CBC WITH DIFFERENTIAL/PLATELET
Abs Immature Granulocytes: 0.68 10*3/uL — ABNORMAL HIGH (ref 0.00–0.07)
Basophils Absolute: 0.1 10*3/uL (ref 0.0–0.1)
Basophils Relative: 1 %
Eosinophils Absolute: 0.2 10*3/uL (ref 0.0–0.5)
Eosinophils Relative: 2 %
HCT: 43.4 % (ref 39.0–52.0)
Hemoglobin: 14 g/dL (ref 13.0–17.0)
Immature Granulocytes: 6 %
Lymphocytes Relative: 8 %
Lymphs Abs: 1 10*3/uL (ref 0.7–4.0)
MCH: 32.9 pg (ref 26.0–34.0)
MCHC: 32.3 g/dL (ref 30.0–36.0)
MCV: 101.9 fL — ABNORMAL HIGH (ref 80.0–100.0)
Monocytes Absolute: 1.1 10*3/uL — ABNORMAL HIGH (ref 0.1–1.0)
Monocytes Relative: 9 %
Neutro Abs: 8.8 10*3/uL — ABNORMAL HIGH (ref 1.7–7.7)
Neutrophils Relative %: 74 %
Platelets: 235 10*3/uL (ref 150–400)
RBC: 4.26 MIL/uL (ref 4.22–5.81)
RDW: 16.3 % — ABNORMAL HIGH (ref 11.5–15.5)
WBC: 11.9 10*3/uL — ABNORMAL HIGH (ref 4.0–10.5)
nRBC: 0 % (ref 0.0–0.2)

## 2021-10-12 LAB — RENAL FUNCTION PANEL
Albumin: 3 g/dL — ABNORMAL LOW (ref 3.5–5.0)
Anion gap: 14 (ref 5–15)
BUN: 65 mg/dL — ABNORMAL HIGH (ref 6–20)
CO2: 23 mmol/L (ref 22–32)
Calcium: 9.6 mg/dL (ref 8.9–10.3)
Chloride: 93 mmol/L — ABNORMAL LOW (ref 98–111)
Creatinine, Ser: 8.14 mg/dL — ABNORMAL HIGH (ref 0.61–1.24)
GFR, Estimated: 7 mL/min — ABNORMAL LOW (ref >60)
Glucose, Bld: 170 mg/dL — ABNORMAL HIGH (ref 70–99)
Phosphorus: 4.7 mg/dL — ABNORMAL HIGH (ref 2.5–4.6)
Potassium: 4.8 mmol/L (ref 3.5–5.1)
Sodium: 130 mmol/L — ABNORMAL LOW (ref 135–145)

## 2021-10-12 MED ORDER — POLYETHYLENE GLYCOL 3350 17 G PO PACK
17.0000 g | PACK | Freq: Every day | ORAL | Status: DC
  Administered 2021-10-12 – 2021-10-14 (×3): 17 g via ORAL
  Filled 2021-10-12 (×4): qty 1

## 2021-10-12 NOTE — Progress Notes (Signed)
Pharmacy Antibiotic Note ? ?Michael Riggs is a 51 y.o. male admitted on 10/06/2021 with cellulitis and no evidence of osteomyelitis on imaging. Wound cultures obtained from outpaient wound care center growing morganella morganii, serratia marascens, and MSSA with potential for inducible AmpC beta-lactamase production . S/p debridement and graft of R achilles on 3/24. Pharmacy has been consulted for cefepime dosing.  ? ?3/26: WBC 11.9; afebrile  ?HD MWF ? ?Plan: ?Continue Cefepime 1g Q24H ?Follow-up cx results for targeted therapy ? ?Height: 5\' 10"  (177.8 cm) ?Weight: 101.7 kg (224 lb 3.3 oz) ?IBW/kg (Calculated) : 73 ? ?Temp (24hrs), Avg:98.3 ?F (36.8 ?C), Min:97.9 ?F (36.6 ?C), Max:98.5 ?F (36.9 ?C) ? ?Recent Labs  ?Lab 10/06/21 ?1243 10/07/21 ?0645 10/08/21 ?0130 10/09/21 ?8309 10/10/21 ?0037 10/10/21 ?4076 10/11/21 ?0615 10/12/21 ?0136  ?WBC 11.9*   < > 10.4 11.4* 12.5*  --  11.1* 11.9*  ?CREATININE 8.17*   < > 8.03* 6.86*  --  9.20* 7.15* 8.14*  ?LATICACIDVEN 1.1  --   --   --   --   --   --   --   ? < > = values in this interval not displayed.  ? ?  ?Estimated Creatinine Clearance: 13.1 mL/min (A) (by C-G formula based on SCr of 8.14 mg/dL (H)).   ? ?Allergies  ?Allergen Reactions  ? Tape Itching  ?  Prefers silk tape over regular tape  ? ? ?Antimicrobials this admission: ?cefepime 3/20 >>  ?vancomycin 3/20 >> 3/21  ? ?Microbiology results: ?3/20 Bcx: 1/2 strep species (likely contaminant)- reincubated for better growth  ?3/21 Bcx: ngtd  ?3/19 Wound cx (from outpt): morganella morganii (heavy growth), serratia marascens (heavy growth-potential for inducible AmpC), MSSA (moderate growth) ?- all Pan-Susceptible  ?3/25 TissueCx: reincubated for better growth ? ? ?Lestine Box, PharmD ?PGY2 Infectious Diseases Pharmacy Resident ?  ?Please check [email or web address] for unit-specific pharmacy phone numbers  ?  ?

## 2021-10-12 NOTE — Plan of Care (Signed)

## 2021-10-12 NOTE — Progress Notes (Signed)
PT awake and alert. Pt refusing CPAP at this time. No resp distress. Will continue to monitor. ?

## 2021-10-12 NOTE — Progress Notes (Signed)
Family Medicine Teaching Service ?Daily Progress Note ?Intern Pager: [phone] ? ?Patient name: Michael Riggs Medical record number: [id number] ?Date of birth: 05-14-1971 Age: 51 y.o. Gender: male ? ?Primary Care Provider: [name], MD ?Consultants: Nephrology, ortho, vascular surgery, wound care, neuro (S/O), cardiology (S/O), podiatry (S/O) ?Code Status: Full ? ?Pt Overview and Major Events to Date:  ?3/20: admitted ?3/24: wound debridement ? ?Assessment and Plan: ?Michael Riggs is a 51 y.o. male presenting with fall and near syncope questionable for stroke, new onset HFrEF and chronic R leg wound. PMH is significant for ESRD on HD, HTN, left BKA, HLD, T2DM, anemia of chronic disease, history of CVA, OSA. ? ?Infected chronic right leg wound ?Remains afebrile.  Blood cultures NGTD x4 days (initial blood culture showed strep sanguinis, likely contaminant).  ?- Orthopedic surgery following, appreciate recs ?- Vascular surgery following, appreciate recs ?- Continue cefepime (3/20-) ?- Follow wound cultures ?- Tylenol 1000 mg every 6 hours as needed ?- Oxy IR 5 mg every 6 hours as needed ?- PT/OT eval and treat ?- Abdominal aortogram planned for 3/28 ? ?Shortness of breath ?Patient reported that on 3/25 at around 11 AM he had acute shortness of breath that was not accompanied by any other symptoms such as chest pain or tachycardia/palpitations or leg swelling.  He was put on O2 at that time though there are no notes and there were no calls made to the team regarding this.  He states that he is improved today, but still remains on 0.75 L of O2 via nasal cannula with reassuring physical exam. ?- Wean oxygen ?- Continue to monitor respiratory status closely ? ?ESRD on HD MWF ?Last HD session 3/24. ?- Nephro following, appreciate recs ?- Midodrine 10 mg for HD days ? ?History of CVA ?- Continue ASA 81 ?- Continue Lipitor 40 ?- Consider Plavix after procedures for DAPT recommendations per neurology ? ?FEN/GI:  Heart healthy ?PPx: Heparin subcu every 8 hours ?Dispo:Pending PT recommendations  . Barriers include continued evaluation by orthopedic and vascular surgery.  ? ?Subjective:  ?Patient ports that he is feeling okay at this time but notes that yesterday around 11 AM he had shortness of breath and was put on some oxygen.  He reports no other symptoms around that time and that he has had improvement today.  It does appear that he was working with physical therapy around that time so not sure if that contributed. ? ?Objective: ?Temp:  [97.9 ?F (36.6 ?C)-98.5 ?F (36.9 ?C)] 98.4 ?F (36.9 ?C) (03/26 0426) ?Pulse Rate:  [62-85] 75 (03/26 0426) ?Resp:  [16-18] 18 (03/26 0426) ?BP: (124-158)/(65-94) 147/69 (03/26 0426) ?SpO2:  [96 %-100 %] 100 % (03/26 0426) ?FiO2 (%):  [21 %] 21 % (03/25 0515) ?Physical Exam: ?General: NAD, supine in bed ?Cardiovascular: RRR, no murmur appreciated ?Respiratory: Clear to auscultation bilaterally, no increased work of breathing, on 0.7 L [place] ?Abdomen: Soft, nontender, nondistended, obese ?Extremities: Left BKA, right leg wound wrapped ? ?Laboratory: ?Recent Labs  ?Lab 10/10/21 ?0037 10/10/21 ?9163 10/11/21 ?0615 10/12/21 ?0136  ?WBC 12.5*  --  11.1* 11.9*  ?HGB 13.5 15.3 14.1 14.0  ?HCT 42.4 45.0 44.8 43.4  ?PLT 250  --  257 235  ? ?Recent Labs  ?Lab 10/06/21 ?1243 10/07/21 ?0645 10/09/21 ?8466 10/10/21 ?5993 10/11/21 ?0615 10/12/21 ?0136  ?NA 133*   < > 133* 130* 131* 130*  ?K 4.7   < > 4.3 4.6 4.5 4.8  ?CL 91*   < > 92* 93*  93* 93*  ?CO2 29   < > 26  --  24 23  ?BUN 45*   < > 38* 59* 43* 65*  ?CREATININE 8.17*   < > 6.86* 9.20* 7.15* 8.14*  ?CALCIUM 9.0   < > 9.5  --  9.3 9.6  ?PROT 7.3  --   --   --   --   --   ?BILITOT 1.5*  --   --   --   --   --   ?ALKPHOS 92  --   --   --   --   --   ?ALT 15  --   --   --   --   --   ?AST 19  --   --   --   --   --   ?GLUCOSE 118*   < > 132* 162* 154* 170*  ? < > = values in this interval not displayed.  ? ? ? ?Imaging/Diagnostic Tests: ?No results  found.  ? ?Michael Patience, DO ?10/12/2021, 5:06 AM ?PGY-2, [place] ?[place] Intern pager: [phone], text pages welcome ? ?

## 2021-10-12 NOTE — Progress Notes (Signed)
?[facility] ?Progress Note  ? ?Subjective:   Seen in room - some nausea this AM, feels a little better now. No CP/dyspnea. ? ?Objective ?Vitals:  ? 10/11/21 1151 10/11/21 1658 10/11/21 2003 10/12/21 0426  ?BP: 124/74 134/86 140/65 (!) 147/69  ?Pulse: 62 72 68 75  ?Resp: 18 18 17 18   ?Temp:  98.5 ?F (36.9 ?C) 97.9 ?F (36.6 ?C) 98.4 ?F (36.9 ?C)  ?TempSrc:  Oral Oral Oral  ?SpO2: 100% 98% 99% 100%  ?Weight:      ?Height:      ? ?Physical Exam ?General: Well appearing, NAD. Room air. ?Heart: RRR, no murmur ?Lungs: CTA anteriorly, no wheezing/rales ?Abdomen: soft ?Extremities: L BKA without stump edema. RLE in large soft boot ?Dialysis Access: L AVF + thrill ? ?Additional Objective ?Labs: ?Basic Metabolic Panel: ?Recent Labs  ?Lab 10/08/21 ?0130 10/09/21 ?5329 10/10/21 ?9242 10/11/21 ?0615 10/12/21 ?0136  ?NA 132* 133* 130* 131* 130*  ?K 4.6 4.3 4.6 4.5 4.8  ?CL 93* 92* 93* 93* 93*  ?CO2 23 26  --  24 23  ?GLUCOSE 134* 132* 162* 154* 170*  ?BUN 42* 38* 59* 43* 65*  ?CREATININE 8.03* 6.86* 9.20* 7.15* 8.14*  ?CALCIUM 9.5 9.5  --  9.3 9.6  ?PHOS 4.4  --   --   --  4.7*  ? ?Liver Function Tests: ?Recent Labs  ?Lab 10/06/21 ?1243 10/12/21 ?0136  ?AST 19  --   ?ALT 15  --   ?ALKPHOS 92  --   ?BILITOT 1.5*  --   ?PROT 7.3  --   ?ALBUMIN 3.2* 3.0*  ? ?CBC: ?Recent Labs  ?Lab 10/08/21 ?0130 10/09/21 ?6834 10/10/21 ?0037 10/10/21 ?1962 10/11/21 ?0615 10/12/21 ?0136  ?WBC 10.4 11.4* 12.5*  --  11.1* 11.9*  ?NEUTROABS 7.8* 8.0* 8.5*  --  8.3* 8.8*  ?HGB 13.8 13.9 13.5 15.3 14.1 14.0  ?HCT 42.7 42.5 42.4 45.0 44.8 43.4  ?MCV 102.2* 101.2* 101.9*  --  103.0* 101.9*  ?PLT 204 217 250  --  257 235  ? ?Blood Culture ?   ?Component Value Date/Time  ? SDES TISSUE 10/10/2021 1016  ? SPECREQUEST RIGHT ACHILLES DEBRIDEMENT Rich A 10/10/2021 1016  ? CULT  10/10/2021 1016  ?  CULTURE REINCUBATED FOR BETTER GROWTH ?Performed at [facility], [place] [address], [place], [place] [address] ?  ? REPTSTATUS PENDING 10/10/2021  1016  ? ?Medications: ? sodium chloride    ? ceFEPime (MAXIPIME) IV 1 g (10/11/21 1726)  ? ? (feeding supplement) PROSource Plus  30 mL Oral BID BM  ? aspirin EC  81 mg Oral Daily  ? atorvastatin  40 mg Oral QHS  ? buPROPion  300 mg Oral Daily  ? calcitRIOL  2 mcg Oral Q M,W,F-HD  ? Chlorhexidine Gluconate Cloth  6 each Topical Q0600  ? docusate sodium  100 mg Oral BID  ? ferric citrate  630 mg Oral TID WC  ? heparin injection (subcutaneous)  5,000 Units Subcutaneous Q8H  ? metoprolol tartrate  25 mg Oral BID  ? midodrine  10 mg Oral Q M,W,F-HD  ? multivitamin  1 tablet Oral QHS  ? pantoprazole  40 mg Oral QHS  ? polyethylene glycol  17 g Oral Daily  ? ramelteon  8 mg Oral QHS  ? tamsulosin  0.4 mg Oral QHS  ? ? ?Dialysis Orders: ?MWF [place] ? 5h  400/500  99.5kg   2/2 bath   LUA AVF   ?-No heparin Heparin none ?-  no esa/ IV fe ? - rocaltrol 2.0 tiw po ?  ?Assessment/Plan: ?Infection/Nonhealing wound  R ankle: IV cefepime, per primary. S/p right achilles debridement and tissue graft 3/24 with ortho. Wound vac in place. Tissue sample collected for culture; primary team to adjust abx according to cx results. At high risk for limb loss. VVS also following - with plan for revascularization RLE this admit.  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 Cardiology consulted. No evidence of excess volume by exam. Optimize volume with HD. Hx high IDWG. ?ESRD (HD MWF): Continue usual MWF schedule - next 3/27. ?HTN/volume: BP slightly high, continue to UF as tolerated.   ?Anemia of CKD: Hgb >12. 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 PO4 at goal. Cont binder, vdra  ?Nutrition: Albumin low, continue supplements. ?Hx L BKA ?Hx severe calciphylaxis (resolved/cured) ? ?Veneta Penton, PA-C ?10/12/2021, 9:00 AM  ?[facility] ? ? ? ?

## 2021-10-13 ENCOUNTER — Encounter (HOSPITAL_COMMUNITY): Payer: Self-pay | Admitting: Orthopedic Surgery

## 2021-10-13 DIAGNOSIS — N186 End stage renal disease: Secondary | ICD-10-CM | POA: Diagnosis not present

## 2021-10-13 DIAGNOSIS — Z992 Dependence on renal dialysis: Secondary | ICD-10-CM | POA: Diagnosis not present

## 2021-10-13 DIAGNOSIS — R7881 Bacteremia: Secondary | ICD-10-CM

## 2021-10-13 DIAGNOSIS — E1152 Type 2 diabetes mellitus with diabetic peripheral angiopathy with gangrene: Secondary | ICD-10-CM | POA: Diagnosis not present

## 2021-10-13 DIAGNOSIS — S91309A Unspecified open wound, unspecified foot, initial encounter: Secondary | ICD-10-CM | POA: Diagnosis not present

## 2021-10-13 DIAGNOSIS — E11621 Type 2 diabetes mellitus with foot ulcer: Secondary | ICD-10-CM | POA: Diagnosis not present

## 2021-10-13 DIAGNOSIS — N185 Chronic kidney disease, stage 5: Secondary | ICD-10-CM | POA: Diagnosis not present

## 2021-10-13 LAB — CBC
HCT: 38.4 % — ABNORMAL LOW (ref 39.0–52.0)
Hemoglobin: 12.3 g/dL — ABNORMAL LOW (ref 13.0–17.0)
MCH: 32.8 pg (ref 26.0–34.0)
MCHC: 32 g/dL (ref 30.0–36.0)
MCV: 102.4 fL — ABNORMAL HIGH (ref 80.0–100.0)
Platelets: 265 10*3/uL (ref 150–400)
RBC: 3.75 MIL/uL — ABNORMAL LOW (ref 4.22–5.81)
RDW: 16.2 % — ABNORMAL HIGH (ref 11.5–15.5)
WBC: 13.4 10*3/uL — ABNORMAL HIGH (ref 4.0–10.5)
nRBC: 0 % (ref 0.0–0.2)

## 2021-10-13 LAB — RENAL FUNCTION PANEL
Albumin: 2.8 g/dL — ABNORMAL LOW (ref 3.5–5.0)
Anion gap: 15 (ref 5–15)
BUN: 94 mg/dL — ABNORMAL HIGH (ref 6–20)
CO2: 20 mmol/L — ABNORMAL LOW (ref 22–32)
Calcium: 9.3 mg/dL (ref 8.9–10.3)
Chloride: 92 mmol/L — ABNORMAL LOW (ref 98–111)
Creatinine, Ser: 10.44 mg/dL — ABNORMAL HIGH (ref 0.61–1.24)
GFR, Estimated: 6 mL/min — ABNORMAL LOW (ref >60)
Glucose, Bld: 172 mg/dL — ABNORMAL HIGH (ref 70–99)
Phosphorus: 5.3 mg/dL — ABNORMAL HIGH (ref 2.5–4.6)
Potassium: 5.2 mmol/L — ABNORMAL HIGH (ref 3.5–5.1)
Sodium: 127 mmol/L — ABNORMAL LOW (ref 135–145)

## 2021-10-13 MED ORDER — SODIUM CHLORIDE 0.9 % IV SOLN: 100.0000 mL | INTRAVENOUS | Status: DC | PRN

## 2021-10-13 MED ORDER — ONDANSETRON 4 MG PO TBDP
4.0000 mg | ORAL_TABLET | Freq: Once | ORAL | Status: AC
  Administered 2021-10-13: 4 mg via ORAL
  Filled 2021-10-13: qty 1

## 2021-10-13 MED ORDER — CEFEPIME HCL 2 G IJ SOLR
2.0000 g | INTRAMUSCULAR | Status: DC
  Administered 2021-10-13 – 2021-10-15 (×2): 2 g via INTRAVENOUS
  Filled 2021-10-13 (×3): qty 2

## 2021-10-13 MED ORDER — LIDOCAINE HCL (PF) 1 % IJ SOLN: 5.0000 mL | INTRAMUSCULAR | Status: DC | PRN

## 2021-10-13 MED ORDER — VANCOMYCIN HCL 10 G IV SOLR
2250.0000 mg | Freq: Once | INTRAVENOUS | Status: AC
  Administered 2021-10-13: 2250 mg via INTRAVENOUS
  Filled 2021-10-13: qty 22.5
  Filled 2021-10-13: qty 2250

## 2021-10-13 MED ORDER — PENTAFLUOROPROP-TETRAFLUOROETH EX AERO: 1.0000 "application " | INHALATION_SPRAY | CUTANEOUS | Status: DC | PRN

## 2021-10-13 MED ORDER — VANCOMYCIN HCL IN DEXTROSE 1-5 GM/200ML-% IV SOLN
1000.0000 mg | INTRAVENOUS | Status: DC
  Administered 2021-10-15: 1000 mg via INTRAVENOUS
  Filled 2021-10-13 (×2): qty 200

## 2021-10-13 MED ORDER — LIDOCAINE-PRILOCAINE 2.5-2.5 % EX CREA: 1.0000 "application " | TOPICAL_CREAM | CUTANEOUS | Status: DC | PRN

## 2021-10-13 NOTE — Progress Notes (Signed)
Family Medicine Teaching Service ?Daily Progress Note ?Intern Pager: [phone] ? ?Patient name: Michael Riggs Medical record number: [id number] ?Date of birth: [birth year] Age: 51 y.o. Gender: male ? ?Primary Care Provider: [name], MD ?Consultants: Nephrology, ortho, vascular surgery, wound care, neuro (S/O), cardiology (S/O), podiatry (S/O) ?Code Status: Full ? ?Pt Overview and Major Events to Date:  ?3/20: admitted ?3/24: wound debridement ? ?Assessment and Plan: ?Michael Riggs is a 51 y.o. male presenting with fall and near syncope questionable for stroke, new onset HFrEF and chronic R leg wound. PMH is significant for ESRD on HD, HTN, left BKA, HLD, T2DM, anemia of chronic disease, history of CVA, OSA. ? ?Infected chronic right leg wound ?Remains afebrile.  Blood cultures NGTD x5 days.  Wound culture grew Morganella and Serratia as prior reported in addition to Corynebacterium. May need to start Vancomycin but will discuss with ID first. Abdominal aortogram scheduled.  CBC still essentially unchanged with WBC 11.9 and ANC 8.8. ?-Orthopedic surgery following, appreciate recommendations ?-Vascular surgery following, appreciate recommendations ?-Continue cefepime (3/20-), consult ID for abx recommendations ?-Tylenol 1000 mg every 6 hours as needed, Oxy IR 5 mg every 6 hours as needed ?-PT/OT eval and treat ?-Abdominal aortogram w/ lower extremity on 3/28 ? ?ESRD on HD MWF ?HD today. ?-Nephrology following, appreciate recommendations ?-Midodrine 10 mg for HD ? ?Shortness of breath: Resolved ?Problem is resolved and patient had nasal cannula on but not connected with wall O2 on 3 L.  Removed and patient remains without shortness of breath. ? ?History of CVA ?-Continue aspirin 81 mg daily, Lipitor 40 mg daily ?-Plavix after procedures complete for DAPT recommendations by neurology ? ?FEN/GI: Renal with fluid restriction, n.p.o. at midnight ?PPx: Heparin SQ every 8 hours ?Dispo:Pending PT  recommendations , pending clinical improvement . Barriers include continued work-up by orthopedic and vascular surgery.  ? ?Subjective:  ?Patient states he is doing well and is looking forward to having a solidified plan after the aortogram.  Denies any pain at this time for his leg and shortness of breath or difficulty breathing. ? ?Objective: ?Temp:  [97.5 ?F (36.4 ?C)-97.8 ?F (36.6 ?C)] 97.8 ?F (36.6 ?C) (03/27 0400) ?Pulse Rate:  [59-66] 66 (03/27 0400) ?Resp:  [17-18] 18 (03/27 0400) ?BP: (125-153)/(70-94) 130/78 (03/27 0400) ?SpO2:  [97 %-100 %] 99 % (03/27 0400) ?Physical Exam: ?General: Awake, alert, NAD ?Cardiovascular: RRR, no murmurs auscultated ?Respiratory: CTA B, no increased work of breathing ?Extremities: Left BKA, right leg wound wrapped in brace with wound VAC in place ? ?Laboratory: ?Recent Labs  ?Lab 10/10/21 ?0037 10/10/21 ?9678 10/11/21 ?0615 10/12/21 ?0136  ?WBC 12.5*  --  11.1* 11.9*  ?HGB 13.5 15.3 14.1 14.0  ?HCT 42.4 45.0 44.8 43.4  ?PLT 250  --  257 235  ? ?Recent Labs  ?Lab 10/06/21 ?1243 10/07/21 ?0645 10/09/21 ?9381 10/10/21 ?0175 10/11/21 ?0615 10/12/21 ?0136  ?NA 133*   < > 133* 130* 131* 130*  ?K 4.7   < > 4.3 4.6 4.5 4.8  ?CL 91*   < > 92* 93* 93* 93*  ?CO2 29   < > 26  --  24 23  ?BUN 45*   < > 38* 59* 43* 65*  ?CREATININE 8.17*   < > 6.86* 9.20* 7.15* 8.14*  ?CALCIUM 9.0   < > 9.5  --  9.3 9.6  ?PROT 7.3  --   --   --   --   --   ?BILITOT 1.5*  --   --   --   --   --   ?  ALKPHOS 92  --   --   --   --   --   ?ALT 15  --   --   --   --   --   ?AST 19  --   --   --   --   --   ?GLUCOSE 118*   < > 132* 162* 154* 170*  ? < > = values in this interval not displayed.  ? ?Imaging/Diagnostic Tests: ?No results found. ? ?Wells Guiles, DO ?10/13/2021, 8:55 AM ?PGY-1, [facility] ?[place] Intern pager: [phone], text pages welcome ? ?

## 2021-10-13 NOTE — Progress Notes (Addendum)
?  Progress Note ? ? ? ?10/13/2021 ?8:01 AM ?3 Days Post-Op ? ?Subjective:  no complaints ? ? ?Vitals:  ? 10/12/21 1936 10/13/21 0400  ?BP: 134/88 130/78  ?Pulse: 62 66  ?Resp: 18 18  ?Temp: 97.6 ?F (36.4 ?C) 97.8 ?F (36.6 ?C)  ?SpO2: 98% 99%  ? ?Physical Exam: ?Cardiac:  regular ?Lungs:  non labored ?Incisions:  Right leg dressed ?Neurologic: alert and oriented ? ?CBC ?   ?Component Value Date/Time  ? WBC 11.9 (H) 10/12/2021 0136  ? RBC 4.26 10/12/2021 0136  ? HGB 14.0 10/12/2021 0136  ? HCT 43.4 10/12/2021 0136  ? PLT 235 10/12/2021 0136  ? MCV 101.9 (H) 10/12/2021 0136  ? MCH 32.9 10/12/2021 0136  ? MCHC 32.3 10/12/2021 0136  ? RDW 16.3 (H) 10/12/2021 0136  ? LYMPHSABS 1.0 10/12/2021 0136  ? MONOABS 1.1 (H) 10/12/2021 0136  ? EOSABS 0.2 10/12/2021 0136  ? BASOSABS 0.1 10/12/2021 0136  ? ? ?BMET ?   ?Component Value Date/Time  ? NA 130 (L) 10/12/2021 0136  ? K 4.8 10/12/2021 0136  ? CL 93 (L) 10/12/2021 0136  ? CO2 23 10/12/2021 0136  ? GLUCOSE 170 (H) 10/12/2021 0136  ? BUN 65 (H) 10/12/2021 0136  ? CREATININE 8.14 (H) 10/12/2021 0136  ? CALCIUM 9.6 10/12/2021 0136  ? GFRNONAA 7 (L) 10/12/2021 0136  ? GFRAA 8 (L) 11/10/2019 1200  ? ? ?INR ?   ?Component Value Date/Time  ? INR 1.6 (H) 04/13/2019 0520  ? ? ? ?Intake/Output Summary (Last 24 hours) at 10/13/2021 0801 ?Last data filed at 10/12/2021 2043 ?Gross per 24 hour  ?Intake 420 ml  ?Output --  ?Net 420 ml  ? ? ? ?Assessment/Plan:  51 y.o. male with right ankle wound ? ?Plan is for Angiogram tomorrow 3/28 ?Reviewed risks/ benefits/ alternatives to procedure and his questions were answered  ?NPO after midnight ?Consent in chart ? ?Karoline Caldwell, PA-C ?[facility] ?[phone] ?10/13/2021 ?8:01 AM ? ?I agree with the plan outlined by the PA above.  The patient is scheduled for angiogram tomorrow. ? ?Annamarie Major ? ? ?

## 2021-10-13 NOTE — Progress Notes (Signed)
[name], male   DOB: 10/29/1970, 51 y.o.   MRN: [id number] ?Patient is postoperative day 3 debridement of Achilles ulcer and placement of biologic tissue graft.  There is 200 cc in the wound VAC canister with a good suction fit.  Wound culture sensitivities are pending, anticipate patient could be discharged on 3 weeks of oral antibiotics.  Patient will discharge with the portable Praveena wound VAC pump.  Anticipate  vascular surgery arteriogram evaluation Tuesday. ?

## 2021-10-13 NOTE — Progress Notes (Signed)
OT Cancellation Note ? ?Patient Details ?Name: Michael Riggs ?MRN: [id number] ?DOB: 10-21-1970 ? ? ?Cancelled Treatment:    Reason Eval/Treat Not Completed: Other (comment) (pt. c/o nausea).  Pt. States he does not feel well and also is leaving for HD soon.  ? ?Clearnce Sorrel Lorraine-COTA/L ?10/13/2021, 12:52 PM ?

## 2021-10-13 NOTE — Progress Notes (Signed)
PT Cancellation Note ? ?Patient Details ?Name: Michael Riggs ?MRN: [id number] ?DOB: [birth year] ? ? ?Cancelled Treatment:    Reason Eval/Treat Not Completed: Other (comment).  Pt is getting meds for nausea, and then has HD.  Declining PT today, will retry at another time. ? ? ?Ramond Dial ?10/13/2021, 11:23 AM ? ?Mee Hives, PT PhD ?Acute Rehab Dept. Number: [facility] [phone] and [place] [phone] ? ?

## 2021-10-13 NOTE — Consult Note (Signed)
?  [name] for Infectious Disease  ? ? ?Date of Admission:  10/06/2021    ? ?Reason for Consult: Infected leg wound ?    ?Referring Physician: Dr Erin Hearing ? ?Current antibiotics: ?Cefepime 3/20-present ? ?Previous antibiotics: ?Vancomycin 3/20 ?Cefazolin 3/20 ? ? ?ASSESSMENT:   ? ?51 y.o. male admitted with: ? ?Polymicrobial infection of a chronic right lower extremity wound: Status post debridement and tissue graft 3/24 with orthopedic surgery and cultures from soft tissue polymicrobial with Morganella, Serratia, and corynebacterium (lab thinks could be C. Striatum).  MRI was negative for osteomyelitis. ?Peripheral arterial disease: Status post previous left BKA and presents with chronic right leg wound as noted above.  Followed by vascular surgery this admission and planning for arteriogram 3/28 with possible intervention. ?End-stage renal disease: On hemodialysis. ?Diabetes: A1c is 6.2. ?Streptococcus sanguinous positive blood culture: Suspected contaminant. ?History of calciphylaxis ? ?RECOMMENDATIONS:   ? ?Continue cefepime dosed for HD ?Add vancomycin dosed for HD due to concern for Corynebacteria striatum which can be more pathogenic ?Wound care, glycemic control ?Follow cultures ?Pending vascular procedure tomorrow ?Will follow ? ? ?Principal Problem: ?  Nonhealing wound of heel ?Active Problems: ?  ESRD (end stage renal disease) ([place]) ?  Cerebral thrombosis with cerebral infarction ?  Gangrene associated with type 2 diabetes mellitus ([place]) ?  PVD (peripheral vascular disease) ([place]) ?  Cellulitis of right lower leg ? ? ?MEDICATIONS:   ? ?Scheduled Meds: ? (feeding supplement) PROSource Plus  30 mL Oral BID BM  ? aspirin EC  81 mg Oral Daily  ? atorvastatin  40 mg Oral QHS  ? buPROPion  300 mg Oral Daily  ? calcitRIOL  2 mcg Oral Q M,W,F-HD  ? Chlorhexidine Gluconate Cloth  6 each Topical Q0600  ? docusate sodium  100 mg Oral BID  ? ferric citrate  630 mg Oral TID WC  ? heparin injection  (subcutaneous)  5,000 Units Subcutaneous Q8H  ? metoprolol tartrate  25 mg Oral BID  ? midodrine  10 mg Oral Q M,W,F-HD  ? multivitamin  1 tablet Oral QHS  ? pantoprazole  40 mg Oral QHS  ? polyethylene glycol  17 g Oral Daily  ? ramelteon  8 mg Oral QHS  ? tamsulosin  0.4 mg Oral QHS  ? ?Continuous Infusions: ? sodium chloride    ? sodium chloride    ? sodium chloride    ? ceFEPime (MAXIPIME) IV 1 g (10/12/21 1817)  ? ?PRN Meds:.sodium chloride, sodium chloride, acetaminophen, albuterol, diphenhydrAMINE, lidocaine (PF), lidocaine-prilocaine, methocarbamol **OR** [DISCONTINUED] methocarbamol (ROBAXIN) IV, oxyCODONE, pentafluoroprop-tetrafluoroeth ? ?HPI:   ? ?Michael Riggs is a 51 y.o. male with a past medical history of end-stage renal disease on hemodialysis, hypertension, prior left BKA, hyperlipidemia, type 2 diabetes, anemia, OSA, history of CVA who presented to the hospital on 10/06/2021 with an infected chronic right leg wound.  Patient was seen in consultation by orthopedic surgery (Dr. Sharol Given) who took patient to the [facility] on 10/10/2021 at which time he underwent right lower extremity debridement of his Achilles ulcer as well as partial excision of his calcaneus.  Patient's operative findings indicated that the wound bed had good healthy bleeding as well as the debrided calcaneus also had healthy bleeding.  Of note, MRI obtained at this admission did not show any evidence of osteomyelitis.  Patient has also been seen by vascular surgery who is planning for an angiogram tomorrow on 3/28.  His ABIs earlier this admission had noted noncompressible right  lower extremity arteries and TBI was 0.50.  His CTA showed mild diffuse multilevel atherosclerosis of the right lower extremity. ? ?Patient's initial blood cultures this admission had grown Streptococcus sanguinous in 1 out of 4 blood culture bottles.  This was presumed to be a contaminant.  His OR cultures from his soft tissue debridement (not bone culture)  have grown Morganella, Serratia, and corynebacterium.  He is currently on cefepime and it is presumed that the corynebacterium could be a contaminant.  We have been consulted for further antibiotic recommendations. ? ? ?Past Medical History:  ?Diagnosis Date  ? Anemia   ? Anemia of chronic disease   ? Anxiety   ? ARF (acute renal failure) ([place]) 01/30/2019  ? Calciphylaxis   ? Controlled type 2 diabetes mellitus with hyperglycemia, without long-term current use of insulin ([place])   ? Debility 02/09/2019  ? Depression   ? Diabetes mellitus type 2 in obese ([place]) 07/04/2013  ? Diabetes mellitus with peripheral vascular disease ([place])   ? type 2 no meds, lost 100 lbs  ? Dyslipidemia   ? Dyspnea   ? inhaler  ? End stage renal disease ([place])   ? ESRD (end stage renal disease) on dialysis ([place]) 01/2019  ? MWFS  ? ESRD on dialysis [facility])   ? Essential hypertension   ? Fluid overload 02/04/2019  ? GERD (gastroesophageal reflux disease)   ? Goals of care, counseling/discussion   ? Habitual alcohol use 07/04/2013  ? Headache   ? Hyperkalemia 01/30/2019  ? Hypertension   ? Hypoglycemia 01/30/2019  ? Hypokalemia 01/30/2019  ? Labile blood pressure   ? Leukocytosis   ? Lower GI bleed 05/14/2019  ? Macrocytic anemia 01/30/2019  ? Obesity, Class II, BMI 35-39.9, with comorbidity 07/04/2013  ? Palliative care encounter   ? PDR (proliferative diabetic retinopathy) ([place]) 12/14/2013  ? Physical deconditioning   ? Pressure injury of skin 04/27/2019  ? Pruritus   ? Scrotal edema   ? Scrotal pain   ? Sleep apnea   ? uses CPAP  ? Sleep disturbance   ? Slow transit constipation   ? Status post below-knee amputation of left lower extremity ([place])   ? Stroke [facility])   ? mini -shown on CT scan  ? Syphilis 01/2019  ? Urinary retention   ? Venous hypertension 09/22/2013  ? Wound infection 03/28/2019  ? ? ?Social History  ? ?Tobacco Use  ? Smoking status: Former  ?  Types: Cigarettes  ?  Start date: 11/18/2018  ?  Quit date: 01/18/2019  ?  Years since quitting: 2.7  ?  Smokeless tobacco: Never  ? Tobacco comments:  ?  pt stated got bored with it  ?Vaping Use  ? Vaping Use: Never used  ?Substance Use Topics  ? Alcohol use: Not Currently  ?  Comment: heavy drinker in the past, none since 07/19/18  ? Drug use: Not Currently  ? ? ?Family History  ?Problem Relation Age of Onset  ? Diabetes Mother   ? Multiple myeloma Mother   ? ? ?Allergies  ?Allergen Reactions  ? Tape Itching  ?  Prefers silk tape over regular tape  ? ? ?Review of Systems  ?Constitutional: Negative.   ?Respiratory: Negative.    ?Cardiovascular: Negative.   ?Gastrointestinal: Negative.   ?Musculoskeletal:  Positive for joint pain.  ?Skin: Negative.   ?All other systems reviewed and are negative. ? ?OBJECTIVE:  ? ?Blood pressure (!) 141/75, pulse 73, temperature 97.7 ?F (36.5 ?C), resp. rate  15, height _0  (1.778 m), weight 105.1 kg, SpO2 99 %. ?Body mass index is 33.25 kg/m?. ? ?Physical Exam ?Constitutional:   ?   General: He is not in acute distress. ?   Appearance: Normal appearance. He is obese.  ?   Comments: Seen on the HD unit.   ?HENT:  ?   Head: Normocephalic and atraumatic.  ?Eyes:  ?   Extraocular Movements: Extraocular movements intact.  ?   Conjunctiva/sclera: Conjunctivae normal.  ?Cardiovascular:  ?   Rate and Rhythm: Normal rate and regular rhythm.  ?Pulmonary:  ?   Effort: Pulmonary effort is normal. No respiratory distress.  ?   Breath sounds: Normal breath sounds.  ?Abdominal:  ?   General: There is no distension.  ?   Palpations: Abdomen is soft.  ?   Tenderness: There is no abdominal tenderness.  ?Musculoskeletal:  ?   Cervical back: Normal range of motion and neck supple.  ?   Comments: S/p left BKA ?Right leg wrapped with vac in place.   ?Skin: ?   General: Skin is warm and dry.  ?   Findings: No rash.  ?Neurological:  ?   General: No focal deficit present.  ?   Mental Status: He is alert and oriented to person, place, and time.  ?Psychiatric:     ?   Mood and Affect: Mood normal.     ?    Behavior: Behavior normal.  ? ? ? ?Lab Results: ?Lab Results  ?Component Value Date  ? WBC 13.4 (H) 10/13/2021  ? HGB 12.3 (L) 10/13/2021  ? HCT 38.4 (L) 10/13/2021  ? MCV 102.4 (H) 10/13/2021  ? PLT 265 10/13/2021  ?  ?

## 2021-10-13 NOTE — Progress Notes (Signed)
Pharmacy Antibiotic Note ? ?Michael Riggs is a 51 y.o. male admitted on 10/06/2021 with infected chronic right leg wound. Now s/p debridement and tissue graft on 3/24. Surgical culture is growing morganella morganii, serratia marcescens and corynebacterium species. Pharmacy has been consulted for vanc and cefepime dosing. Patient is ESRD with HD on MWF. Currently in HD. Has not received Vancomycin since 3/20 so will reload.  ? ? ?Plan: ?Vancomycin 2250 mg x 1 then 1000 mg with each HD  ?Change Cefepime to 2 gm with HD MWF  ?Monitor HD sessions, cultures  ? ?Height: 5\' 10"  (177.8 cm) ?Weight: 105.1 kg (231 lb 11.3 oz) ?IBW/kg (Calculated) : 73 ? ?Temp (24hrs), Avg:97.7 ?F (36.5 ?C), Min:97.5 ?F (36.4 ?C), Max:97.8 ?F (36.6 ?C) ? ?Recent Labs  ?Lab 10/09/21 ?9826 10/10/21 ?0037 10/10/21 ?4158 10/11/21 ?0615 10/12/21 ?0136 10/13/21 ?3094  ?WBC 11.4* 12.5*  --  11.1* 11.9* 13.4*  ?CREATININE 6.86*  --  9.20* 7.15* 8.14* 10.44*  ? ?  ?Estimated Creatinine Clearance: 10.4 mL/min (A) (by C-G formula based on SCr of 10.44 mg/dL (H)).   ? ?Allergies  ?Allergen Reactions  ? Tape Itching  ?  Prefers silk tape over regular tape  ? ? ?Antimicrobials this admission: ?cefepime 3/20 >>  ?vancomycin 3/20 >> 3/21 , 3/27> ? ?Microbiology results: ?3/20 Bcx: 1/2 strep species (likely contaminant)- reincubated for better growth  ?3/21 Bcx: ngtd  ?3/19 Wound cx (from outpt): morganella morganii (heavy growth), serratia marascens (heavy growth-potential for inducible AmpC), MSSA (moderate growth) ?- all Pan-Susceptible  ?3/24 TissueCx: Morganella, serratia, corynebacterium  ? ? ? ?Jimmy Footman, PharmD, BCPS, BCIDP ?Infectious Diseases Clinical Pharmacist ?Phone: [phone]  ?Please check [email or web address] for unit-specific pharmacy phone numbers  ?  ?

## 2021-10-13 NOTE — Progress Notes (Signed)
?[facility] ?Progress Note  ? ?Subjective: Seen in bed. No C/Os. HD later today.  ? ?Objective ?Vitals:  ? 10/12/21 0904 10/12/21 1629 10/12/21 1936 10/13/21 0400  ?BP: (!) 153/94 125/70 134/88 130/78  ?Pulse: 65 (!) 59 62 66  ?Resp: 17 18 18 18   ?Temp:  (!) 97.5 ?F (36.4 ?C) 97.6 ?F (36.4 ?C) 97.8 ?F (36.6 ?C)  ?TempSrc:  Oral Oral Oral  ?SpO2: 100% 97% 98% 99%  ?Weight:      ?Height:      ? ?Physical Exam ?General: Pleasant chronically ill appearing male in NAD ?Skin: diffuse scaring LE from previous calciphylaxis.  ?Heart: S1.S2 no M/R/G.  ?Lungs: CTAB ?Abdomen: NABS, NT, ND ?Extremities: L BKA. No stump edema. RLE with drsg intact R ankle. Trace edema RLE only.   ?Dialysis Access: L AVF +T/B ? ? ?Additional Objective ?Labs: ?Basic Metabolic Panel: ?Recent Labs  ?Lab 10/08/21 ?0130 10/09/21 ?4401 10/10/21 ?0272 10/11/21 ?0615 10/12/21 ?0136  ?NA 132* 133* 130* 131* 130*  ?K 4.6 4.3 4.6 4.5 4.8  ?CL 93* 92* 93* 93* 93*  ?CO2 23 26  --  24 23  ?GLUCOSE 134* 132* 162* 154* 170*  ?BUN 42* 38* 59* 43* 65*  ?CREATININE 8.03* 6.86* 9.20* 7.15* 8.14*  ?CALCIUM 9.5 9.5  --  9.3 9.6  ?PHOS 4.4  --   --   --  4.7*  ? ?Liver Function Tests: ?Recent Labs  ?Lab 10/06/21 ?1243 10/12/21 ?0136  ?AST 19  --   ?ALT 15  --   ?ALKPHOS 92  --   ?BILITOT 1.5*  --   ?PROT 7.3  --   ?ALBUMIN 3.2* 3.0*  ? ?No results for input(s): LIPASE, AMYLASE in the last 168 hours. ?CBC: ?Recent Labs  ?Lab 10/08/21 ?0130 10/09/21 ?5366 10/10/21 ?0037 10/10/21 ?4403 10/11/21 ?0615 10/12/21 ?0136  ?WBC 10.4 11.4* 12.5*  --  11.1* 11.9*  ?NEUTROABS 7.8* 8.0* 8.5*  --  8.3* 8.8*  ?HGB 13.8 13.9 13.5 15.3 14.1 14.0  ?HCT 42.7 42.5 42.4 45.0 44.8 43.4  ?MCV 102.2* 101.2* 101.9*  --  103.0* 101.9*  ?PLT 204 217 250  --  257 235  ? ?Blood Culture ?   ?Component Value Date/Time  ? SDES TISSUE 10/10/2021 1016  ? SPECREQUEST RIGHT ACHILLES DEBRIDEMENT [place] A 10/10/2021 1016  ? CULT  10/10/2021 1016  ?  ABUNDANT MORGANELLA MORGANII ?ABUNDANT  SERRATIA MARCESCENS ?CULTURE REINCUBATED FOR BETTER GROWTH ?SUSCEPTIBILITIES TO FOLLOW ?HOLDING FOR POSSIBLE ANAEROBE ?Performed at [facility], [place] [address] [place], [place] [address] ?  ? REPTSTATUS PENDING 10/10/2021 1016  ? ? ?Cardiac Enzymes: ?No results for input(s): CKTOTAL, CKMB, CKMBINDEX, TROPONINI in the last 168 hours. ?CBG: ?Recent Labs  ?Lab 10/09/21 ?2122 10/10/21 ?0747 10/10/21 ?1030 10/10/21 ?1131 10/10/21 ?1646  ?GLUCAP 151* 184* 130* 89 114*  ? ?Iron Studies: No results for input(s): IRON, TIBC, TRANSFERRIN, FERRITIN in the last 72 hours. ?@lablastinr3 @ ?Studies/Results: ?No results found. ?Medications: ? sodium chloride    ? ceFEPime (MAXIPIME) IV 1 g (10/12/21 1817)  ? ? (feeding supplement) PROSource Plus  30 mL Oral BID BM  ? aspirin EC  81 mg Oral Daily  ? atorvastatin  40 mg Oral QHS  ? buPROPion  300 mg Oral Daily  ? calcitRIOL  2 mcg Oral Q M,W,F-HD  ? Chlorhexidine Gluconate Cloth  6 each Topical Q0600  ? docusate sodium  100 mg Oral BID  ? ferric citrate  630 mg Oral TID WC  ?  heparin injection (subcutaneous)  5,000 Units Subcutaneous Q8H  ? metoprolol tartrate  25 mg Oral BID  ? midodrine  10 mg Oral Q M,W,F-HD  ? multivitamin  1 tablet Oral QHS  ? pantoprazole  40 mg Oral QHS  ? polyethylene glycol  17 g Oral Daily  ? ramelteon  8 mg Oral QHS  ? tamsulosin  0.4 mg Oral QHS  ? ? ? ?Dialysis Orders: ?MWF [place] ? 5h  400/500  99.5kg   2/2 bath   LUA AVF   ?-No heparin  ?-no esa/ IV fe ?- rocaltrol 2.0 tiw po ?  ?Assessment/Plan: ?Infection/Nonhealing wound  R ankle: IV cefepime, per primary. S/p right achilles debridement and tissue graft 3/24 with ortho. Wound vac in place. Tissue sample collected for culture; primary team to adjust abx according to cx results. At high risk for limb loss. VVS also following - with plan for revascularization RLE this admit.  ?Fall with near syncope: H/O CVA. New 76mm infarct on CT. Possible artifact but W/U per primary/neurolgy.  ?Combined  systolic/diastolic HF: EF 76% H2CN per echo 10/07/2021. Cardiology consulted. No evidence of excess volume by exam. Optimize volume with HD. Hx high IDWG. ?ESRD (HD MWF): Continue usual MWF schedule - next HD 3/27. ?HTN/volume: BP slightly high, continue to UF as tolerated.   ?Anemia of CKD: Hgb >12. 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 PO4 at goal. Cont binder, vdra  ?Nutrition: Albumin low, continue supplements. ?Hx L BKA ?Hx severe calciphylaxis (resolved/cured) ? ?Georjean Toya H. Rayelynn Loyal NP-C ?10/13/2021, 9:06 AM  ?[facility] ?[phone] ? ? ?  ? ?

## 2021-10-13 NOTE — Progress Notes (Signed)
FPTS Interim Progress Note ? ?S: Patient seen at bedside for nighttime rounds. Was in no distress. Had no complaints other than some itching that resolved with benadryl. Pain well-controlled. ? ?O: ?BP 130/78 (BP Location: Right Arm)   Pulse 66   Temp 97.8 ?F (36.6 ?C) (Oral)   Resp 18   Ht 5\' 10"  (1.778 m)   Wt 101.7 kg   SpO2 99%   BMI 32.17 kg/m?   ?General: Resting in bed comfortably ?CV: RRR ?Resp: Normal work of breathing on room air, clear to auscultation anteriorly ?Ext: Left BKA, right leg wrapped in dressing ? ?A/P: ?Infected right leg wound ?Afebrile ?- Continue IV abx ?- tylenol q6h as needed ?- Oxy IR 5mg  q6h as needed ? ?Labs reviewed. No new orders placed. ? ?Orvis Brill, DO ?10/13/2021, 5:39 AM ?PGY-1, [place] [facility] ?Service pager [phone] ? ?

## 2021-10-13 NOTE — Progress Notes (Signed)
removed 303mls net fluid no complaints no complications [id number] tx well. pre bp 168/75 post bp 128/55 pre weight 105.1kg post weight 102.0kg bed scales. 2 bandages to lua avf no bleeding dressing cdi .  gave calcitril benadryl for itching and midodrine for bp support as ordered. ?

## 2021-10-14 ENCOUNTER — Encounter (HOSPITAL_COMMUNITY)
Admission: EM | Disposition: A | Discharge status: Home / Self Care | Payer: Self-pay | Source: Ambulatory Visit | Attending: Family Medicine

## 2021-10-14 ENCOUNTER — Inpatient Hospital Stay (HOSPITAL_COMMUNITY): Payer: Medicare Other

## 2021-10-14 DIAGNOSIS — S91309A Unspecified open wound, unspecified foot, initial encounter: Secondary | ICD-10-CM | POA: Diagnosis not present

## 2021-10-14 DIAGNOSIS — E1152 Type 2 diabetes mellitus with diabetic peripheral angiopathy with gangrene: Secondary | ICD-10-CM | POA: Diagnosis not present

## 2021-10-14 DIAGNOSIS — N186 End stage renal disease: Secondary | ICD-10-CM | POA: Diagnosis not present

## 2021-10-14 DIAGNOSIS — Z992 Dependence on renal dialysis: Secondary | ICD-10-CM | POA: Diagnosis not present

## 2021-10-14 LAB — VITAMIN D 25 HYDROXY (VIT D DEFICIENCY, FRACTURES): Vit D, 25-Hydroxy: 21.82 ng/mL — ABNORMAL LOW (ref 30–100)

## 2021-10-14 LAB — BASIC METABOLIC PANEL
Anion gap: 13 (ref 5–15)
BUN: 59 mg/dL — ABNORMAL HIGH (ref 6–20)
CO2: 26 mmol/L (ref 22–32)
Calcium: 9.1 mg/dL (ref 8.9–10.3)
Chloride: 90 mmol/L — ABNORMAL LOW (ref 98–111)
Creatinine, Ser: 7.89 mg/dL — ABNORMAL HIGH (ref 0.61–1.24)
GFR, Estimated: 8 mL/min — ABNORMAL LOW (ref >60)
Glucose, Bld: 183 mg/dL — ABNORMAL HIGH (ref 70–99)
Potassium: 4.6 mmol/L (ref 3.5–5.1)
Sodium: 129 mmol/L — ABNORMAL LOW (ref 135–145)

## 2021-10-14 LAB — BLOOD GAS, VENOUS
Acid-Base Excess: 2 mmol/L (ref 0.0–2.0)
Bicarbonate: 29.4 mmol/L — ABNORMAL HIGH (ref 20.0–28.0)
O2 Saturation: 24.7 %
Patient temperature: 36.8
pCO2, Ven: 57 mmHg (ref 44–60)
pH, Ven: 7.32 (ref 7.25–7.43)
pO2, Ven: <31 mmHg — CL (ref 32–45)

## 2021-10-14 LAB — CBC WITH DIFFERENTIAL/PLATELET
Abs Immature Granulocytes: 1.16 10*3/uL — ABNORMAL HIGH (ref 0.00–0.07)
Basophils Absolute: 0.1 10*3/uL (ref 0.0–0.1)
Basophils Relative: 1 %
Eosinophils Absolute: 0.1 10*3/uL (ref 0.0–0.5)
Eosinophils Relative: 1 %
HCT: 40.6 % (ref 39.0–52.0)
Hemoglobin: 12.9 g/dL — ABNORMAL LOW (ref 13.0–17.0)
Immature Granulocytes: 9 %
Lymphocytes Relative: 6 %
Lymphs Abs: 0.8 10*3/uL (ref 0.7–4.0)
MCH: 32.6 pg (ref 26.0–34.0)
MCHC: 31.8 g/dL (ref 30.0–36.0)
MCV: 102.5 fL — ABNORMAL HIGH (ref 80.0–100.0)
Monocytes Absolute: 0.7 10*3/uL (ref 0.1–1.0)
Monocytes Relative: 6 %
Neutro Abs: 9.8 10*3/uL — ABNORMAL HIGH (ref 1.7–7.7)
Neutrophils Relative %: 77 %
Platelets: 250 10*3/uL (ref 150–400)
RBC: 3.96 MIL/uL — ABNORMAL LOW (ref 4.22–5.81)
RDW: 16.5 % — ABNORMAL HIGH (ref 11.5–15.5)
WBC: 12.7 10*3/uL — ABNORMAL HIGH (ref 4.0–10.5)
nRBC: 0 % (ref 0.0–0.2)

## 2021-10-14 LAB — GLUCOSE, CAPILLARY: Glucose-Capillary: 162 mg/dL — ABNORMAL HIGH (ref 70–99)

## 2021-10-14 LAB — MAGNESIUM: Magnesium: 2 mg/dL (ref 1.7–2.4)

## 2021-10-14 LAB — TROPONIN I (HIGH SENSITIVITY): Troponin I (High Sensitivity): 15 ng/L (ref <18)

## 2021-10-14 IMAGING — CR DG CHEST 2V
2 series · 2 of 2 positions shown · non-contrast
Comparison: Chest radiograph [DATE]

CLINICAL DATA: Unresponsive episode.

EXAM:
CHEST - 2 VIEW

[chest lat]
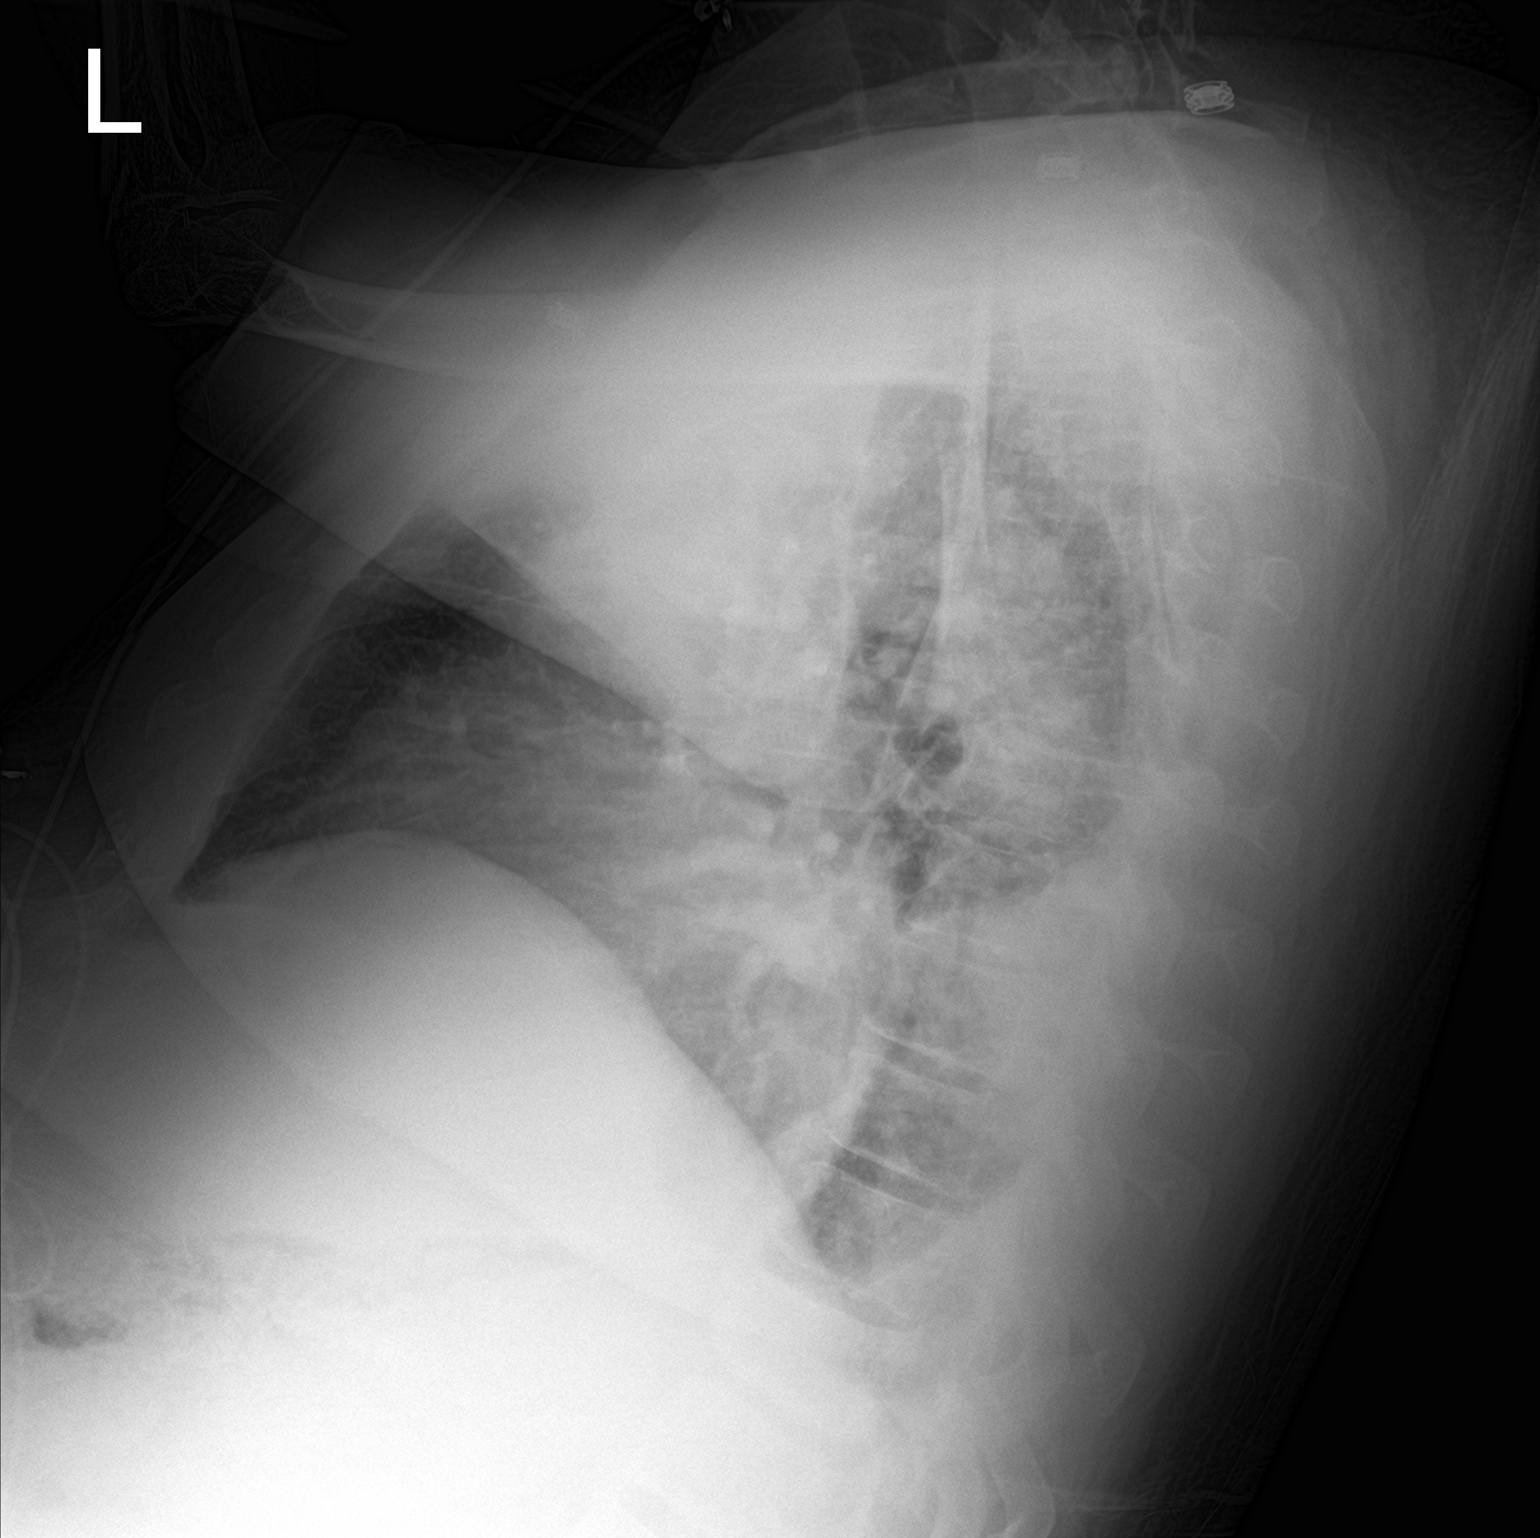

[chest ap]
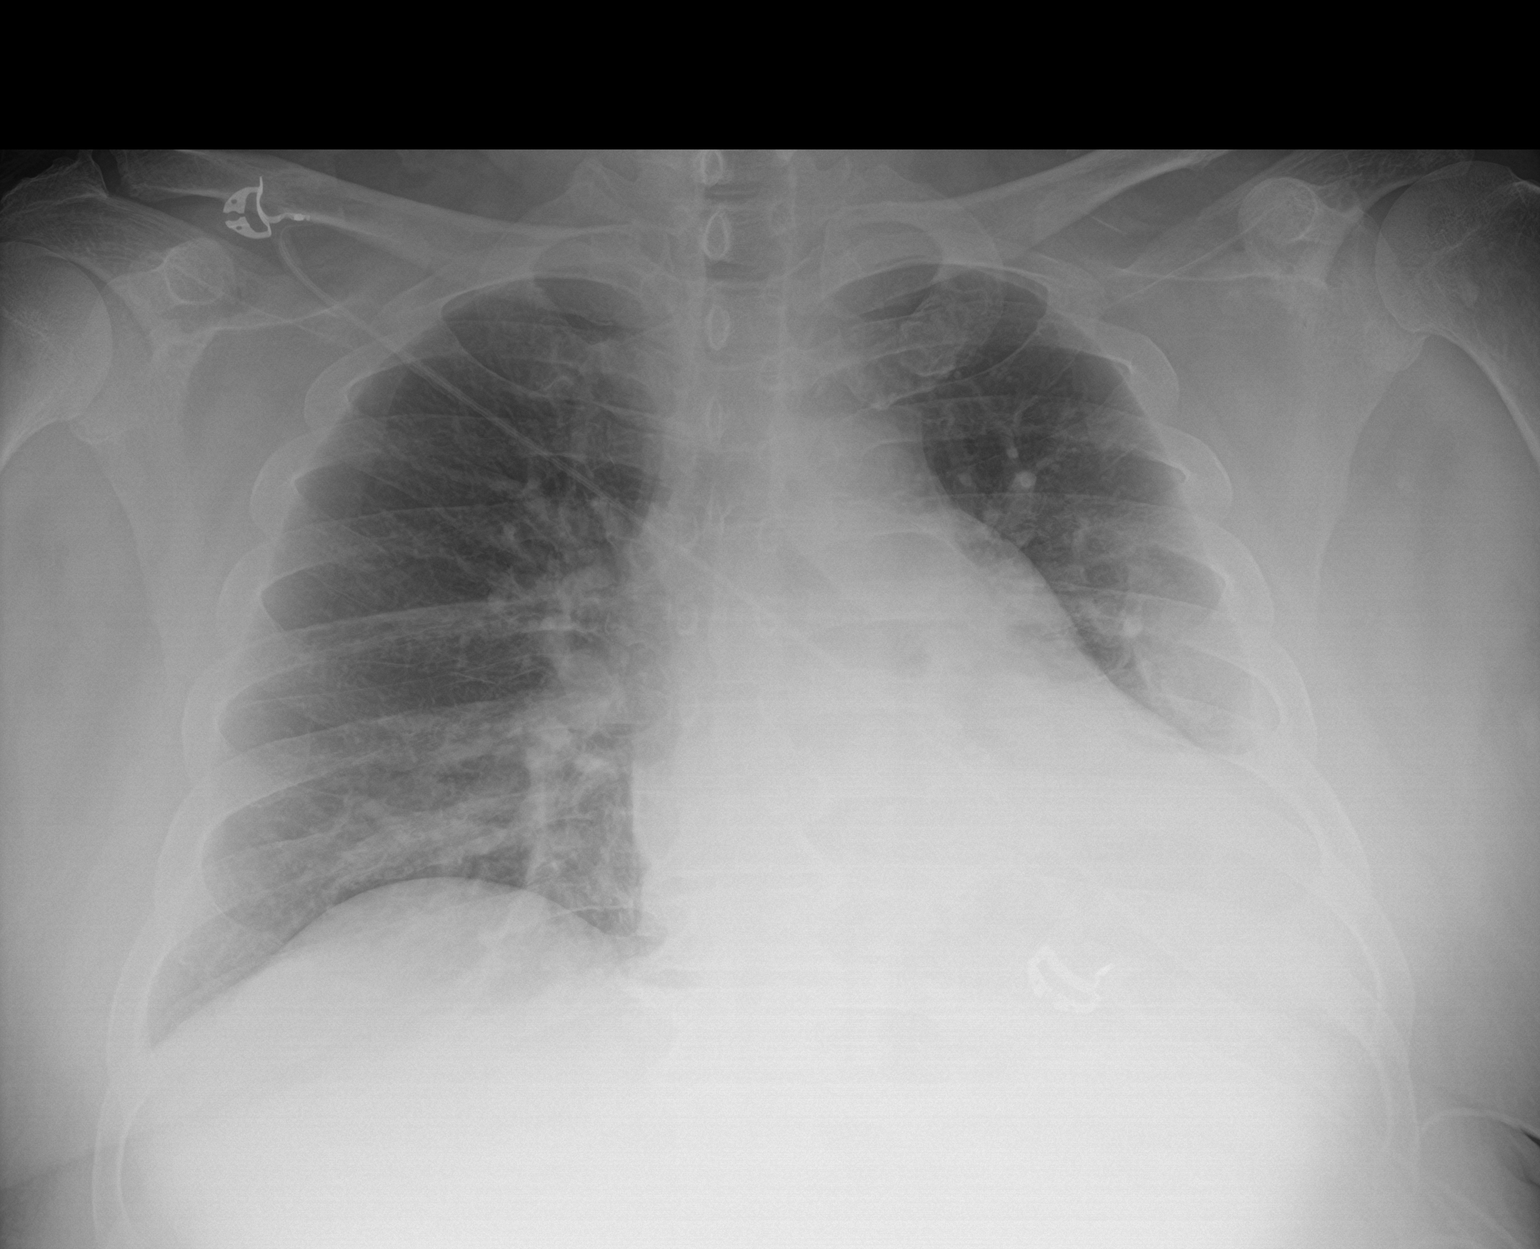

[2 of 2 positions shown; findings below may reference images not displayed]

FINDINGS: The cardiac silhouette remains mildly enlarged. Density in the left
lower hemithorax is similar to the prior study and reflects a
combination of a small to moderate pleural effusion and lung
consolidation or atelectasis. The right lung remains grossly clear.
No pneumothorax is identified. No acute osseous abnormality is seen.
IMPRESSION: Persistent left pleural effusion with left basilar atelectasis or
consolidation.

## 2021-10-14 SURGERY — ABDOMINAL AORTOGRAM W/LOWER EXTREMITY
Anesthesia: LOCAL

## 2021-10-14 MED ORDER — ZINC SULFATE 220 (50 ZN) MG PO CAPS
220.0000 mg | ORAL_CAPSULE | Freq: Every day | ORAL | Status: DC
  Administered 2021-10-14 – 2021-10-16 (×2): 220 mg via ORAL
  Filled 2021-10-14 (×2): qty 1

## 2021-10-14 MED ORDER — MELATONIN 5 MG PO TABS
10.0000 mg | ORAL_TABLET | Freq: Once | ORAL | Status: DC
Start: 2021-10-14 — End: 2021-10-14

## 2021-10-14 MED ORDER — ASCORBIC ACID 500 MG PO TABS
250.0000 mg | ORAL_TABLET | Freq: Two times a day (BID) | ORAL | Status: DC
  Administered 2021-10-14 – 2021-10-16 (×3): 250 mg via ORAL
  Filled 2021-10-14 (×3): qty 1

## 2021-10-14 MED ORDER — NEPRO/CARBSTEADY PO LIQD: 237.0000 mL | ORAL | Status: DC

## 2021-10-14 NOTE — Progress Notes (Signed)
FPTS Interim Progress Note ? ?S:Patient had CPAP placed with RT. He had just turned off lights to sleep and says "people keep coming in the room but I'm trying to rest." Denied any pain, other complaints. Denies issues with breathing or mentation. ? ?O: ?BP 126/81 (BP Location: Right Arm)   Pulse 76   Temp 97.7 ?F (36.5 ?C) (Oral)   Resp 18   Ht 5\' 10"  (1.778 m)   Wt 102.1 kg   SpO2 100%   BMI 32.30 kg/m?   ?General: Laying in bed with CPAP mask on ?Resp: Normal work of breathing ?CV: RRR ? ?A/P: ?Apneic episode/ AMS earlier today ?No further episodes this evening. ?- Monitor with continuous cardiac monitoring ? ?Other plans per day team for infected R leg wound, hopeful for aortogram on 3/30. ? ?Labs and orders reviewed. No new orders at this time. ? ?Orvis Brill, DO ?10/14/2021, 11:20 PM ?PGY-1, [place] ?Service pager [phone] ? ?

## 2021-10-14 NOTE — Progress Notes (Signed)
?   ? ?[facility] ? ?Date of Admission:  10/06/2021    ?       ?Reason for visit: Follow up on right lower extremity wound ? ?Current antibiotics: ?Cefepime 3/20-present ?Vancomycin 3/20; 3/27-present ?  ?Previous antibiotics: ?Cefazolin 3/20 ? ? ?ASSESSMENT:   ? ?51 y.o. male admitted with: ? 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  Cultures from soft tissue polymicrobial with Morganella, Serratia, and corynebacterium (possibly striatum).  MRI was negative for osteomyelitis, however, operative note indicates partial debridement of calcaneus was performed. ?Peripheral arterial disease: Status post previous left BKA and presenting with chronic right lower extremity wound as noted above.  Arteriogram this morning canceled due to patient episode of unresponsiveness.  Per patient, tentatively rescheduled for tomorrow. ?End-stage renal disease: On hemodialysis. ?Diabetes: A1c is 6.2. ? ?RECOMMENDATIONS:   ? ?Continue cefepime dosed for HD ?Continue vancomycin dosed for HD ?Wound care, glycemic control ?Follow cultures ?Await further vascular procedures ?Will follow ? ? ?Principal Problem: ?  Nonhealing wound of heel ?Active Problems: ?  ESRD (end stage renal disease) ([place]) ?  Cerebral thrombosis with cerebral infarction ?  Gangrene associated with type 2 diabetes mellitus ([place]) ?  PVD (peripheral vascular disease) ([place]) ?  Cellulitis of right lower leg ? ? ? ?MEDICATIONS:   ? ?Scheduled Meds: ? (feeding supplement) PROSource Plus  30 mL Oral BID BM  ? aspirin EC  81 mg Oral Daily  ? atorvastatin  40 mg Oral QHS  ? buPROPion  300 mg Oral Daily  ? calcitRIOL  2 mcg Oral Q M,W,F-HD  ? Chlorhexidine Gluconate Cloth  6 each Topical Q0600  ? docusate sodium  100 mg Oral BID  ? ferric citrate  630 mg Oral TID WC  ? heparin injection (subcutaneous)  5,000 Units Subcutaneous Q8H  ? metoprolol tartrate  25 mg  Oral BID  ? midodrine  10 mg Oral Q M,W,F-HD  ? multivitamin  1 tablet Oral QHS  ? pantoprazole  40 mg Oral QHS  ? polyethylene glycol  17 g Oral Daily  ? ramelteon  8 mg Oral QHS  ? tamsulosin  0.4 mg Oral QHS  ? ?Continuous Infusions: ? sodium chloride    ? ceFEPime (MAXIPIME) IV 2 g (10/13/21 1755)  ? [START ON 10/15/2021] vancomycin    ? ?PRN Meds:.acetaminophen, albuterol, diphenhydrAMINE, methocarbamol **OR** [DISCONTINUED] methocarbamol (ROBAXIN) IV, oxyCODONE ? ?SUBJECTIVE:  ? ?24 hour events:  ?Patient went for aortogram earlier today but was noted to have issues gasping for air with his eyes rolling back in his head.  He ultimately responded to voice and touch and reported that he fell asleep.  His procedure was canceled and he reports this will be done tomorrow. ?Afebrile, Tmax 98.6 ?No new labs ?No update on cultures ?No new imaging.  Chest x-ray pending ? ?Patient reports that he feels well at the moment.  He reiterates that his procedure was canceled earlier today and will be done tomorrow.  He is tolerating his antibiotics.  He has no new complaints.  He reports that he ambulated to the bathroom using his left BKA prosthesis earlier today. ? ?Review of Systems  ?All other systems reviewed and are negative. ? ?  ?OBJECTIVE:  ? ?Blood pressure 133/83, pulse 71, temperature 97.7 ?F (36.5 ?C), temperature source Oral, resp. rate 18, height 5\' 10"  (1.778 m), weight 102.1 kg, SpO2 100 %. ?Body mass index  is 32.3 kg/m?. ? ?Physical Exam ?Constitutional:   ?   General: He is not in acute distress. ?   Appearance: Normal appearance. He is obese.  ?HENT:  ?   Head: Normocephalic and atraumatic.  ?Eyes:  ?   Extraocular Movements: Extraocular movements intact.  ?   Conjunctiva/sclera: Conjunctivae normal.  ?Pulmonary:  ?   Effort: Pulmonary effort is normal. No respiratory distress.  ?Musculoskeletal:  ?   Cervical back: Normal range of motion and neck supple.  ?   Comments: Left BKA.  Right lower extremity  wrapped.  ?Neurological:  ?   General: No focal deficit present.  ?   Mental Status: He is alert and oriented to person, place, and time.  ?Psychiatric:     ?   Mood and Affect: Mood normal.     ?   Behavior: Behavior normal.  ? ? ? ?Lab Results: ?Lab Results  ?Component Value Date  ? WBC 13.4 (H) 10/13/2021  ? HGB 12.3 (L) 10/13/2021  ? HCT 38.4 (L) 10/13/2021  ? MCV 102.4 (H) 10/13/2021  ? PLT 265 10/13/2021  ?  ?Lab Results  ?Component Value Date  ? NA 127 (L) 10/13/2021  ? K 5.2 (H) 10/13/2021  ? CO2 20 (L) 10/13/2021  ? GLUCOSE 172 (H) 10/13/2021  ? BUN 94 (H) 10/13/2021  ? CREATININE 10.44 (H) 10/13/2021  ? CALCIUM 9.3 10/13/2021  ? GFRNONAA 6 (L) 10/13/2021  ? GFRAA 8 (L) 11/10/2019  ?  ?Lab Results  ?Component Value Date  ? ALT 15 10/06/2021  ? AST 19 10/06/2021  ? ALKPHOS 92 10/06/2021  ? BILITOT 1.5 (H) 10/06/2021  ? ? ?   ?Component Value Date/Time  ? CRP 19.4 (H) 03/28/2019 2028  ? ? ?   ?Component Value Date/Time  ? ESRSEDRATE >140 (H) 03/28/2019 2028  ? ?  ?I have reviewed the micro and lab results in Epic. ? ?Imaging: ?No results found.  ? ?Imaging independently reviewed in Epic.  ? ? ?Mignon Pine ?[facility] ?[place] ?[phone] pager ?10/14/2021, 10:40 AM ? ?I have personally spent 50 minutes involved in face-to-face and non-face-to-face activities for this patient on the day of the visit. Professional time spent includes the following activities: Preparing to see the patient (review of tests), Obtaining and/or reviewing separately obtained history (admission/discharge record), Performing a medically appropriate examination and/or evaluation , Ordering medications/tests/procedures, referring and communicating with other health care professionals, Documenting clinical information in the EMR, Independently interpreting results (not separately reported), Communicating results to the patient/family/caregiver, Counseling and educating the  patient/family/caregiver and Care coordination (not separately reported).  ? ? ?

## 2021-10-14 NOTE — Plan of Care (Signed)
°  Problem: Education: °Goal: Knowledge of General Education information will improve °Description: Including pain rating scale, medication(s)/side effects and non-pharmacologic comfort measures °Outcome: Progressing °  °Problem: Clinical Measurements: °Goal: Ability to maintain clinical measurements within normal limits will improve °Outcome: Progressing °Goal: Respiratory complications will improve °Outcome: Progressing °Goal: Cardiovascular complication will be avoided °Outcome: Progressing °  °Problem: Activity: °Goal: Risk for activity intolerance will decrease °Outcome: Progressing °  °Problem: Nutrition: °Goal: Adequate nutrition will be maintained °Outcome: Progressing °  °

## 2021-10-14 NOTE — Progress Notes (Signed)
Pt was waiting for procedure in Cath Lab holding. Pt was observed to be gasping for air with eyes rolled back in head.  Pt responded to loud voices, and touch. Pt feel he fell asleep. Dr Trula Slade called to bedside. O2 started at 2 liters, neuro checks within normal limits, vital stable and within normal limits. Dr Trula Slade consulting with attending physician. Order to return pt back to floor.  ?

## 2021-10-14 NOTE — Progress Notes (Signed)
Rt spoke with pt about cpap.  Pt will call when he is ready to wear. RT will continue to monitor. ?

## 2021-10-14 NOTE — Progress Notes (Signed)
Family Medicine Teaching Service ?Daily Progress Note ?Intern Pager: [phone] ? ?Patient name: Michael Riggs Medical record number: [id number] ?Date of birth: 1970-07-24 Age: 51 y.o. Gender: male ? ?Primary Care Provider: [name], MD ?Consultants: Infectious Disease, Nephrology, ortho, vascular surgery, wound care, neuro (S/O), cardiology (S/O), podiatry (S/O) ?Code Status: Full ? ?Pt Overview and Major Events to Date:  ?3/20: admitted ?3/24: wound debridement ? ?Assessment and Plan: ?Michael Riggs is a 51 y.o. male presenting with fall and near syncope questionable for stroke, new onset HFrEF and chronic R leg wound. PMH is significant for ESRD on HD, HTN, left BKA, HLD, T2DM, anemia of chronic disease, history of CVA, OSA. ? ?Apneic episode ?Pt reportedly had episode of turning blue and having his eyes roll into the back of his head while at imaging for aortogram. No seizure-like movements. Returned to baseline before imaging provider could be present. Per patient, will try for aortogram tomorrow. No history of seizures and post-ictal state is questionable. Physical exam unremarkable, hemodynamically stable and afebrile. Highest on differential is apneic episode related to lack of sleep overnight and without CPAP. Less likely seizure-activity. Received HD yesterday, likely not electrolyte abnormalities. Will obtain STAT labs and imaging to further workup. Patient has been stable for the last 5+ days, will need to proceed with rescheduling aortogram after monitoring and workup. ?-Continuous cardiac monitoring x 24 hours ?-CXR ?-ABG, BMP, CBC w/ diff, Troponin ? ?Infected chronic R leg wound ?Remains afebrile.  Started on vancomycin yesterday for current bacterium as recommended by ID.  Abdominal aortogram scheduled today. ?-Orthopedic surgery following, appreciate recommendations ?-Vascular surgery following, appreciate recommendations ?-ID following, appreciate recommendations ?-Continue cefepime  (3/20-), Vancomycin (3/27-) ?-Tylenol 1000 mg every 6 hours as needed, Oxy IR 5 mg every 6 hours as needed ?-continue PT/OT ? ?ESRD on HD MWF ?HD yesterday. ?-Nephrology following, appreciate recommendations ?-Midodrine 10mg  for HD ? ?Hx of CVA ?-Continue aspirin 81 mg daily, Lipitor 40 mg daily ?-Plavix after procedures complete for DAPT recommendations by neurology ? ?FEN/GI: N.p.o. for aortogram ?PPx: Heparin SQ every 8 hours ?Dispo:Pending PT recommendations . Barriers include continued work-up by orthopedic and vascular surgery.  ? ?Subjective:  ?Pt states he did not get any sleep overnight nor used his CPAP. He believes he fell asleep and was not getting enough air since he was flat on his back. He was told they may try the procedure again tomorrow. ? ?Objective: ?Temp:  [97.7 ?F (36.5 ?C)-98.6 ?F (37 ?C)] 97.9 ?F (36.6 ?C) (03/28 0729) ?Pulse Rate:  [57-74] 70 (03/28 0729) ?Resp:  [10-21] 16 (03/28 0729) ?BP: (120-168)/(55-88) 158/88 (03/28 0729) ?SpO2:  [100 %] 100 % (03/28 0729) ?Weight:  [102.1 kg-105.1 kg] 102.1 kg (03/27 1623) ?Physical Exam: ?General: awake, alert, NAD ?HEENT: PERRL, EOMI ?Neuro: no focal neurological deficits, no speech deficits, strength 5/5 in all extremities ?Cardiovascular: RRR, no murmurs auscultated ?Respiratory: CTAB, no increased work of breathing ?Abdomen: soft, no abdominal tenderness ?Extremities: left BKA, right leg wound wrapped in brace ? ?Laboratory: ?Recent Labs  ?Lab 10/11/21 ?0615 10/12/21 ?0136 10/13/21 ?4854  ?WBC 11.1* 11.9* 13.4*  ?HGB 14.1 14.0 12.3*  ?HCT 44.8 43.4 38.4*  ?PLT 257 235 265  ? ?Recent Labs  ?Lab 10/11/21 ?0615 10/12/21 ?0136 10/13/21 ?6270  ?NA 131* 130* 127*  ?K 4.5 4.8 5.2*  ?CL 93* 93* 92*  ?CO2 24 23 20*  ?BUN 43* 65* 94*  ?CREATININE 7.15* 8.14* 10.44*  ?CALCIUM 9.3 9.6 9.3  ?GLUCOSE 154* 170* 172*  ? ? ?  Imaging/Diagnostic Tests: ?No results found. ? ?Wells Guiles, DO ?10/14/2021, 7:47 AM ?PGY-1, [facility] ?[place] Intern  pager: [phone], text pages welcome ? ?

## 2021-10-14 NOTE — TOC Progression Note (Addendum)
Transition of Care (TOC) - Progression Note  ? ? ?Patient Details  ?Name: Michael Riggs ?MRN: [id number] ?Date of Birth: 06-30-1971 ? ?Transition of Care (TOC) CM/SW Contact  ?Marilu Favre, RN ?Phone Number: ?10/14/2021, 3:50 PM ? ?Clinical Narrative:    ? ?Discussed home health PT/OT with patient at bedside. Patient does not feel that he needs home health at this time. He lives with his mother, they have an aide that comes every day , when aide is now there his brother helps him.  ? ?NCM discussed HHPT/OT and services they provide , patient declined.  ? ?Patient has wheel chair and a lift at home.  ? ?Secure chatted attending  ?Transition of Care (TOC) Screening Note ? ? ?Patient Details  ?Name: Michael Riggs ?Date of Birth: 09-Aug-1970 ? ? ? ? ?Transition of Care Department [facility]) has reviewed patient and no TOC needs have been identified at this time. We will continue to monitor patient advancement through interdisciplinary progression rounds. If new patient transition needs arise, please place a TOC consult. ?  ? ?Expected Discharge Plan: Home/Self Care ?Barriers to Discharge: Continued Medical Work up ? ?Expected Discharge Plan and Services ?Expected Discharge Plan: Home/Self Care ?  ?Discharge Planning Services: CM Consult ?Post Acute Care Choice: Home Health ?Living arrangements for the past 2 months: Apartment ?                ?DME Arranged: N/A ?DME Agency: NA ?  ?  ?  ?HH Arranged: Refused HH ?  ?  ?  ?  ? ? ?Social Determinants of Health (SDOH) Interventions ?  ? ?Readmission Risk Interventions ?   ? View : No data to display.  ?  ?  ?  ? ? ?

## 2021-10-14 NOTE — Progress Notes (Signed)
Patient arrived to the holding area for his angiogram.  Upon arrival he became unresponsive, turned blue and his eyes rolled back into his head.  When I arrived to evaluate him, he was back to his baseline.  He had no focal deficits and was appropriately communicating.  O2 sats were 100% and BP was 145/85, HR 72.  I spoke with his primary team.  His procedure will be cancelled and he will be returned to the floor. ? ?Annamarie Major ?

## 2021-10-14 NOTE — Progress Notes (Signed)
Physical Therapy Treatment ?Patient Details ?Name: Michael Riggs ?MRN: [id number] ?DOB: 11-Aug-1970 ?Today's Date: 10/14/2021 ? ? ?History of Present Illness Pt is a 51 y/o M presenting to ED for facial and L knee pain after fall/near syncopal episode. He is now s/p R achilles debridement and tissue graft with application of wound VAC on 10/10/2021. PMH includes L BKA, ESRD on HD, HTN, DM2, CVA and anemia of chronic disease. ? ?  ?PT Comments  ? ? Pt received seated EOB and agreeable to session with focus on "ambulatory" O2 sats. Pt unable to ambulate at this time due to [facility] precautions/restrictions. O2 sats gathered during [facility] mobility. Pt requiring close min guard squat pivot transfer from bed<>WC. Pt able to propel WC with BUE at grossly supervision level. Pt desatting to 84% on RA after 75' of WC self propulsion (wave not uniform), recovering to 99% quickly with pt at rest and cues for PLB. Pt continues to benefit from skilled PT services to progress toward functional mobility goals.  ?  ?Recommendations for follow up therapy are one component of a multi-disciplinary discharge planning process, led by the attending physician.  Recommendations may be updated based on patient status, additional functional criteria and insurance authorization. ? ?Follow Up Recommendations ? Home health PT ?  ?  ?Assistance Recommended at Discharge Set up Supervision/Assistance  ?Patient can return home with the following A little help with walking and/or transfers;A little help with bathing/dressing/bathroom;Assistance with cooking/housework;Assist for transportation ?  ?Equipment Recommendations ? Other (comment) (Slideboard)  ?  ?Recommendations for Other Services   ? ? ?  ?Precautions / Restrictions Precautions ?Precautions: Fall ?Precaution Comments: L BKA at baseline ?Required Braces or Orthoses: Other Brace ?Other Brace: Podus boot (light gray PRAFO without kickstand) ?Restrictions ?Weight Bearing Restrictions: Yes ?RLE  Weight Bearing: Non weight bearing  ?  ? ?Mobility ? Bed Mobility ?  ?  ?  ?  ?  ?  ?  ?General bed mobility comments: seated EOB on arrival ?  ? ?Transfers ?Overall transfer level: Needs assistance ?Equipment used: None ?Transfers: Bed to chair/wheelchair/BSC ?  ?  ?  ?  ?  ? Lateral/Scoot Transfers: Min guard ?General transfer comment: Close guard as pt squat pivot on LLE prosthesis bed <>WC ?  ? ?Ambulation/Gait ?  ?  ?  ?  ?  ?  ?  ?General Gait Details: Unable ? ? ?Stairs ?  ?  ?  ?  ?  ? ? ?Wheelchair Mobility ?Wheelchair Mobility ?Wheelchair mobility: Yes ?Wheelchair propulsion: Both upper extremities ?Wheelchair parts: Supervision/cueing ?Distance: 140 (x2 rest break) ? ?Modified Rankin (Stroke Patients Only) ?  ? ? ?  ?Balance Overall balance assessment: Needs assistance ?Sitting-balance support: Feet supported ?Sitting balance-Leahy Scale: Good ?Sitting balance - Comments: Able to donn prothesis in sitting and maintain balance ?  ?  ?Standing balance-Leahy Scale: Poor ?Standing balance comment: Requires support to maintain balance, unable to able full static or dynamic standing balance. ?  ?  ?  ?  ?  ?  ?  ?  ?  ?  ?  ?  ? ?  ?Cognition Arousal/Alertness: Awake/alert ?Behavior During Therapy: Impulsive ?Overall Cognitive Status: History of cognitive impairments - at baseline ?  ?  ?  ?  ?  ?  ?  ?  ?  ?  ?  ?  ?  ?  ?  ?  ?  ?  ?  ? ?  ?Exercises   ? ?  ?  General Comments   ?  ?  ? ?Pertinent Vitals/Pain Pain Assessment ?Pain Assessment: No/denies pain  ? ? ?Home Living   ?  ?  ?  ?  ?  ?  ?  ?  ?  ?   ?  ?Prior Function    ?  ?  ?   ? ?PT Goals (current goals can now be found in the care plan section) Acute Rehab PT Goals ?Patient Stated Goal: To get back to walking and return home. ?PT Goal Formulation: With patient ?Time For Goal Achievement: 10/22/21 ? ?  ?Frequency ? ? ? Min 3X/week ? ? ? ?  ?PT Plan Current plan remains appropriate  ? ? ?Co-evaluation   ?  ?  ?  ?  ? ?  ?AM-PAC PT "6 Clicks" Mobility    ?Outcome Measure ? Help needed turning from your back to your side while in a flat bed without using bedrails?: None ?Help needed moving from lying on your back to sitting on the side of a flat bed without using bedrails?: A Little ?Help needed moving to and from a bed to a chair (including a wheelchair)?: A Little ?Help needed standing up from a chair using your arms (e.g., wheelchair or bedside chair)?: A Little ?Help needed to walk in hospital room?: A Lot ?Help needed climbing 3-5 steps with a railing? : A Lot ?6 Click Score: 17 ? ?  ?End of Session Equipment Utilized During Treatment:  (Prosthesis) ?Activity Tolerance: Patient tolerated treatment well ?Patient left: in bed;with call bell/phone within reach;with bed alarm set ?Nurse Communication: Mobility status ?PT Visit Diagnosis: Unsteadiness on feet (R26.81);Other abnormalities of gait and mobility (R26.89);Repeated falls (R29.6) ?  ? ? ?Time: 3888-2800 ?PT Time Calculation (min) (ACUTE ONLY): 28 min ? ?Charges:  $Therapeutic Activity: 23-37 mins          ?          ? ?Audry Riles. PTA ?Acute Rehabilitation Services ?Office: [phone] ? ? ? ?Betsey Holiday Kage Willmann ?10/14/2021, 3:59 PM ? ?

## 2021-10-14 NOTE — Progress Notes (Addendum)
Initial Nutrition Assessment ? ?DOCUMENTATION CODES:  ? ?Not applicable ? ?INTERVENTION:  ?Continue diet as ordered  ? ?Continue renal MVI  ? ?Continue Prosource Plus BID, each supplement provides 100 kcal and 15 grams of protein  ? ?Nepro Shake po once daily, each supplement provides 425 kcal and 19 grams protein ? ?Check Vitamin A, C, D, Zinc lab values ? ?Vitamin C 250 mg BID x 30 days  ? ?Zinc 220 mg x 14 days  ? ? ?NUTRITION DIAGNOSIS:  ? ?Increased nutrient needs related to wound healing (nonhealing wound of right heel) as evidenced by estimated needs. ? ?GOAL:  ? ?Patient will meet greater than or equal to 90% of their needs ? ? ?MONITOR:  ? ?PO intake, Supplement acceptance, Weight trends, Labs, I & O's ? ?REASON FOR ASSESSMENT:  ? ?Other (Comment) (wounds) ?  ? ?ASSESSMENT:  ? ?Patient is a 51 year old male who presented after fall and near syncope and was admitted for nonhealing wound of right heel. Past medical history significant for DM type 2, anemia of chronic disease, history of CVA, ESRD on HD MWFS, HTN, left BKA, HLD, GERD. ? ?3/24: wound debridement ? ?Percent meal completion: 75-100% x 7 days when diet ordered ? ?Pt is currently on a renal diet with 1.2 L fluid restriction ? ?Discussed pt in progressive rounds and with RN.  ?Per RN, angiogram cancelled this morning due brief unresponsive episode. Per pt, he suspects that he fell asleep. Plan to evaluate again for tomorrow  ? ?Met with pt at bedside. Pt sitting up on the side of bed. Per pt, pt endorses a fair appetite today but reports that his appetite comes and goes due to periodic nausea. Pt's meal tray had just arrived prior to entering pt's room and pt was about to eat sliced carrots, chicken, and rice. Prior to admission, pt reports that he typically eats 1-2 meals a day and states that he has been intentionally trying to lose weight and has been eating only half of whatever he orders. Pt reports that on days that he has HD he typically  orders breakfast from [employer] (meat omelette with bacon, sausage, ham and whole wheat toast) and that after HD he eats a filet o fish sandwich from [employer]. Pt reports that on non-HD days, he orders food from [facility], skips lunch, and dinner varies, most times it is take out food or his cousin cooks a meal for him.  ? ?Discussed oral nutrition supplementation with pt and pt agreeable to trying Nepro to aid in caloric and protein intake. Discussed the importance of good nutrition and adequate PO intake for wound healing. ? ?Per pt, pt reports being 350# about 2 years ago. Pt reports that he was in the hospital for 6 months over a year ago and weight decreased to 215#. Pt reports that his current body weight is 220#.  ? ?Current weight: 102.1 kg  ?Admit weight: 99.8 kg ?EDW: 99.5 kg ? ?Patient's last HD on 3/27 with UF 3L, post weight of 102 kg.  ? ?Labs reviewed and include: Na: 129 (L), Potassium: 4.6, Phosphorus: 5.3 (H)  ?Per chart review, pt's potassium was 5.2 on 3/27. Will continue to monitor.  ? ?Medications reviewed and include: Prosource BID, Colace, Rena-vit, miralax, protonix, vancomycin, maxipime ? ?NUTRITION - FOCUSED PHYSICAL EXAM: ? ?Flowsheet Row Most Recent Value  ?Orbital Region No depletion  ?Upper Arm Region No depletion  ?Thoracic and Lumbar Region No depletion  ?Buccal Region No  depletion  ?Temple Region No depletion  ?Clavicle Bone Region No depletion  ?Clavicle and Acromion Bone Region No depletion  ?Scapular Bone Region No depletion  ?Dorsal Hand No depletion  ?Patellar Region --  [unable to assess left side due to BKA]  ?Anterior Thigh Region --  [unable to assess left side due to BKA]  ?Posterior Calf Region --  [unable to assess left side due to BKA]  ?Edema (RD Assessment) None  ?Hair Reviewed  ?Eyes Reviewed  ?Mouth Reviewed  ?Skin Reviewed  ?Nails Reviewed  ? ?  ? ? ?Diet Order:   ?Diet Order   ? ?       ?  Diet renal with fluid restriction Fluid restriction: 1200 mL Fluid;  Room service appropriate? Yes; Fluid consistency: Thin  Diet effective now       ?  ? ?  ?  ? ?  ? ? ?EDUCATION NEEDS:  ? ?Education needs have been addressed ? ?Skin:  Skin Assessment: Reviewed RN Assessment ? ?Last BM:  3/26 - type 2 ? ?Height:  ? ?Ht Readings from Last 1 Encounters:  ?10/06/21 '5\' 10"'  (1.778 m)  ? ? ?Weight:  ? ?Wt Readings from Last 1 Encounters:  ?10/13/21 102.1 kg  ? ? ?Ideal Body Weight:  75.5 kg ? ?BMI:  Adjusted Body mass index is 34.5 kg/m?Marland Kitchen Adjusted for left BKA ? ?Estimated Nutritional Needs:  ? ?Kcal:  2400 - 2600 ? ?Protein:  120 - 140 gm ? ?Fluid:  1L + UOP ? ? ? ?Maryruth Hancock, Dietetic Intern ?10/14/2021 4:02 PM ?

## 2021-10-14 NOTE — Progress Notes (Signed)
FPTS Interim Progress Note ? ?S:Patient sleeping comfortably. Did not wake patient. ? ?O: ?BP 125/74 (BP Location: Right Wrist)   Pulse 65   Temp 98.4 ?F (36.9 ?C) (Oral)   Resp 18   Ht 5\' 10"  (1.778 m)   Wt 102.1 kg   SpO2 100%   BMI 32.30 kg/m?   ?General: Sleeping ?Resp: Normal chest rise and fall ? ?A/P: ?Infect R leg wound ?- Tylenol q6h PRN, Oxy IR 5mg  q6h PRN ?- Continue management per day team ? ?Labs and orders reviewed. No new orders. ? ?Orvis Brill, DO ?10/14/2021, 12:50 AM ?PGY-1, [place] ?Service pager [phone] ? ?

## 2021-10-14 NOTE — Progress Notes (Signed)
?[facility] ?Progress Note  ? ?Subjective:    ?Seen at bedside. Patient was dialyzed yesterday (UF 3L, post wgt of 102kg). Went for aortogram  this AM but was cancelled due to unresponsive event where he was gasping for air in cath lab holding. Ultimately, patient quickly returned to baseline and was transferred back to floor. He tells me that they plan to do the cath again tomorrow.  ? ?Objective ?Vitals:  ? 10/14/21 0859 10/14/21 0900 10/14/21 0901 10/14/21 0928  ?BP: (!) 165/88 (!) 148/85 (!) 151/88 133/83  ?Pulse: 72 71 72 71  ?Resp: (!) 8 15 12 18   ?Temp:    97.7 ?F (36.5 ?C)  ?TempSrc:    Oral  ?SpO2: 100% 100% 100% 100%  ?Weight:      ?Height:      ? ?Physical Exam ?General: Pleasant chronically ill appearing male in NAD ?Heart: S1 S2 distinct. No MRG. ?Lungs: CTAB. Sating 100 on RA ?Abdomen: soft NT, ND ?Extremities: L BKA. No stump edema. RLE with drsg intact R ankle. Trace edema RLE only.   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 ?Additional Objective ?Labs: ?Basic Metabolic Panel: ?Recent Labs  ?Lab 10/08/21 ?0130 10/09/21 ?8527 10/11/21 ?0615 10/12/21 ?0136 10/13/21 ?7824  ?NA 132*   < > 131* 130* 127*  ?K 4.6   < > 4.5 4.8 5.2*  ?CL 93*   < > 93* 93* 92*  ?CO2 23   < > 24 23 20*  ?GLUCOSE 134*   < > 154* 170* 172*  ?BUN 42*   < > 43* 65* 94*  ?CREATININE 8.03*   < > 7.15* 8.14* 10.44*  ?CALCIUM 9.5   < > 9.3 9.6 9.3  ?PHOS 4.4  --   --  4.7* 5.3*  ? < > = values in this interval not displayed.  ? ?Liver Function Tests: ?Recent Labs  ?Lab 10/12/21 ?0136 10/13/21 ?2353  ?ALBUMIN 3.0* 2.8*  ? ?No results for input(s): LIPASE, AMYLASE in the last 168 hours. ?CBC: ?Recent Labs  ?Lab 10/09/21 ?6144 10/10/21 ?0037 10/10/21 ?3154 10/11/21 ?0615 10/12/21 ?0136 10/13/21 ?0086  ?WBC 11.4* 12.5*  --  11.1* 11.9* 13.4*  ?NEUTROABS 8.0* 8.5*  --  8.3* 8.8*  --   ?HGB 13.9 13.5   < > 14.1 14.0 12.3*  ?HCT 42.5 42.4   < > 44.8 43.4 38.4*  ?MCV 101.2*  101.9*  --  103.0* 101.9* 102.4*  ?PLT 217 250  --  257 235 265  ? < > = values in this interval not displayed.  ? ?Blood Culture ?   ?Component Value Date/Time  ? SDES TISSUE 10/10/2021 1016  ? SPECREQUEST RIGHT ACHILLES DEBRIDEMENT [place] A 10/10/2021 1016  ? CULT  10/10/2021 1016  ?  ABUNDANT MORGANELLA MORGANII ?ABUNDANT SERRATIA MARCESCENS ?ABUNDANT CORYNEBACTERIUM SPECIES ?Standardized susceptibility testing for this organism is not available. ?CULTURE REINCUBATED FOR BETTER GROWTH ?NO ANAEROBES ISOLATED; CULTURE IN PROGRESS FOR 5 DAYS ?  ? REPTSTATUS PENDING 10/10/2021 1016  ? ? ?Cardiac Enzymes: ?No results for input(s): CKTOTAL, CKMB, CKMBINDEX, TROPONINI in the last 168 hours. ?CBG: ?Recent Labs  ?Lab 10/09/21 ?2122 10/10/21 ?0747 10/10/21 ?1030 10/10/21 ?1131 10/10/21 ?1646  ?GLUCAP 151* 184* 130* 89 114*  ? ?Iron Studies: No results for input(s): IRON, TIBC, TRANSFERRIN, FERRITIN in the last 72 hours. ?@lablastinr3 @ ?Studies/Results: ?No results found. ?Medications: ? sodium chloride    ? ceFEPime (MAXIPIME) IV 2 g (10/13/21 1755)  ? [START ON  10/15/2021] vancomycin    ? ? (feeding supplement) PROSource Plus  30 mL Oral BID BM  ? aspirin EC  81 mg Oral Daily  ? atorvastatin  40 mg Oral QHS  ? buPROPion  300 mg Oral Daily  ? calcitRIOL  2 mcg Oral Q M,W,F-HD  ? Chlorhexidine Gluconate Cloth  6 each Topical Q0600  ? docusate sodium  100 mg Oral BID  ? ferric citrate  630 mg Oral TID WC  ? heparin injection (subcutaneous)  5,000 Units Subcutaneous Q8H  ? metoprolol tartrate  25 mg Oral BID  ? midodrine  10 mg Oral Q M,W,F-HD  ? multivitamin  1 tablet Oral QHS  ? pantoprazole  40 mg Oral QHS  ? polyethylene glycol  17 g Oral Daily  ? ramelteon  8 mg Oral QHS  ? tamsulosin  0.4 mg Oral QHS  ? ?Dialysis Orders: ?MWF [facility] ? 5h  400/500  99.5kg   2/2 bath   LUA AVF   ?-No heparin  ?-no esa/ IV fe ?- rocaltrol 2.0 tiw po ? ?Assessment/Plan: ?Infection/Nonhealing wound  R ankle: IV cefepime, per primary. S/p  right achilles debridement and tissue graft 3/24 with ortho. Wound vac in place. Tissue sample collected for culture; primary team to adjust abx according to cx results. At high risk for limb loss. VVS also following - with plan for revascularization RLE this admit.  ?Fall with near syncope: H/O CVA. New 70mm infarct on CT. Possible artifact but W/U per primary/neurolgy.  ?Combined systolic/diastolic HF: EF 66% A6TK per echo 10/07/2021. Cardiology consulted. No evidence of excess volume by exam. Optimize volume with HD. Hx high IDWG. ?ESRD (HD MWF): Continue usual MWF schedule - next HD 3/29. ?HTN/volume: BP slightly high, continue to UF as tolerated.   ?Anemia of CKD: Hgb >12. 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 PO4 at goal. Cont binder, vdra  ?Nutrition: Albumin low, continue supplements. ?Hx L BKA ?Hx severe calciphylaxis (resolved/cured) ? ?Josh Penninger, PA-S2  ? ?Seen with PA-S, as above. Agree with assessment/plan. ?Jayanth Szczesniak H. Kellyann Ordway NP-C ?10/14/2021, 10:04 AM  ?[facility] ?[phone] ? ?

## 2021-10-14 NOTE — Care Management Important Message (Signed)
Important Message ? ?Patient Details  ?Name: Michael Riggs ?MRN: [id number] ?Date of Birth: 1971/07/04 ? ? ?Medicare Important Message Given:  Yes ? ? ? ? ?Levada Dy  Maripat Borba-Martin ?10/14/2021, 12:29 PM ?

## 2021-10-14 NOTE — Progress Notes (Signed)
Mobility Specialist Progress Note: ? ? 10/14/21 1242  ?Mobility  ?Activity Ambulated with assistance in room  ?Level of Assistance Contact guard assist, steadying assist  ?Assistive Device Wheelchair  ?RLE Weight Bearing NWB  ?Distance Ambulated (ft) 2 ft  ?Activity Response Tolerated well  ?$Mobility charge 1 Mobility  ? ?Pt received in bathroom needing to get back to bed. No complaints of pain. Pt left in bed with call bell in reach and all needs met.   ? ?  ?Mobility Specialist ?Primary Phone [phone] ? ?

## 2021-10-15 ENCOUNTER — Encounter (HOSPITAL_COMMUNITY): Payer: Self-pay | Admitting: Orthopedic Surgery

## 2021-10-15 ENCOUNTER — Encounter (HOSPITAL_COMMUNITY)
Admission: EM | Disposition: A | Discharge status: Home / Self Care | Payer: Self-pay | Source: Ambulatory Visit | Attending: Family Medicine

## 2021-10-15 DIAGNOSIS — I70233 Atherosclerosis of native arteries of right leg with ulceration of ankle: Secondary | ICD-10-CM

## 2021-10-15 DIAGNOSIS — S91309A Unspecified open wound, unspecified foot, initial encounter: Secondary | ICD-10-CM | POA: Diagnosis not present

## 2021-10-15 HISTORY — PX: ABDOMINAL AORTOGRAM W/LOWER EXTREMITY: CATH118223

## 2021-10-15 HISTORY — PX: PERIPHERAL VASCULAR BALLOON ANGIOPLASTY: CATH118281

## 2021-10-15 LAB — RENAL FUNCTION PANEL
Albumin: 2.9 g/dL — ABNORMAL LOW (ref 3.5–5.0)
Anion gap: 15 (ref 5–15)
BUN: 76 mg/dL — ABNORMAL HIGH (ref 6–20)
CO2: 24 mmol/L (ref 22–32)
Calcium: 9.6 mg/dL (ref 8.9–10.3)
Chloride: 90 mmol/L — ABNORMAL LOW (ref 98–111)
Creatinine, Ser: 9.03 mg/dL — ABNORMAL HIGH (ref 0.61–1.24)
GFR, Estimated: 7 mL/min — ABNORMAL LOW (ref >60)
Glucose, Bld: 133 mg/dL — ABNORMAL HIGH (ref 70–99)
Phosphorus: 5 mg/dL — ABNORMAL HIGH (ref 2.5–4.6)
Potassium: 5.5 mmol/L — ABNORMAL HIGH (ref 3.5–5.1)
Sodium: 129 mmol/L — ABNORMAL LOW (ref 135–145)

## 2021-10-15 LAB — AEROBIC/ANAEROBIC CULTURE W GRAM STAIN (SURGICAL/DEEP WOUND): Gram Stain: NONE SEEN

## 2021-10-15 SURGERY — ABDOMINAL AORTOGRAM W/LOWER EXTREMITY
Anesthesia: LOCAL

## 2021-10-15 MED ORDER — HYDRALAZINE HCL 20 MG/ML IJ SOLN: 5.0000 mg | INTRAMUSCULAR | Status: DC | PRN

## 2021-10-15 MED ORDER — ACETAMINOPHEN 325 MG PO TABS
650.0000 mg | ORAL_TABLET | ORAL | Status: DC | PRN
  Administered 2021-10-16: 650 mg via ORAL
  Filled 2021-10-15: qty 2

## 2021-10-15 MED ORDER — IODIXANOL 320 MG/ML IV SOLN
INTRAVENOUS | Status: DC | PRN
  Administered 2021-10-15: 125 mL

## 2021-10-15 MED ORDER — LIDOCAINE HCL (PF) 1 % IJ SOLN
INTRAMUSCULAR | Status: DC | PRN
Start: 2021-10-15 — End: 2021-10-15
  Administered 2021-10-15: 10 mL

## 2021-10-15 MED ORDER — SODIUM CHLORIDE 0.9 % IV SOLN: 250.0000 mL | INTRAVENOUS | Status: DC | PRN

## 2021-10-15 MED ORDER — HEPARIN SODIUM (PORCINE) 1000 UNIT/ML IJ SOLN
INTRAMUSCULAR | Status: AC
  Filled 2021-10-15: qty 10

## 2021-10-15 MED ORDER — LIDOCAINE HCL (PF) 1 % IJ SOLN
INTRAMUSCULAR | Status: AC
  Filled 2021-10-15: qty 30

## 2021-10-15 MED ORDER — CLOPIDOGREL BISULFATE 75 MG PO TABS
300.0000 mg | ORAL_TABLET | Freq: Once | ORAL | Status: AC
  Administered 2021-10-15: 300 mg via ORAL
  Filled 2021-10-15: qty 4

## 2021-10-15 MED ORDER — CLOPIDOGREL BISULFATE 75 MG PO TABS
75.0000 mg | ORAL_TABLET | Freq: Every day | ORAL | Status: DC
  Filled 2021-10-15: qty 1

## 2021-10-15 MED ORDER — LABETALOL HCL 5 MG/ML IV SOLN: 10.0000 mg | INTRAVENOUS | Status: DC | PRN

## 2021-10-15 MED ORDER — HEPARIN (PORCINE) IN NACL 1000-0.9 UT/500ML-% IV SOLN
INTRAVENOUS | Status: DC | PRN
  Administered 2021-10-15 (×2): 500 mL

## 2021-10-15 MED ORDER — ONDANSETRON HCL 4 MG/2ML IJ SOLN: 4.0000 mg | Freq: Four times a day (QID) | INTRAMUSCULAR | Status: DC | PRN

## 2021-10-15 MED ORDER — HEPARIN SODIUM (PORCINE) 1000 UNIT/ML IJ SOLN
INTRAMUSCULAR | Status: DC | PRN
  Administered 2021-10-15: 7000 [IU] via INTRAVENOUS

## 2021-10-15 MED ORDER — HEPARIN (PORCINE) IN NACL 1000-0.9 UT/500ML-% IV SOLN
INTRAVENOUS | Status: AC
  Filled 2021-10-15: qty 1000

## 2021-10-15 MED ORDER — SODIUM CHLORIDE 0.9% FLUSH: 3.0000 mL | INTRAVENOUS | Status: DC | PRN

## 2021-10-15 MED ORDER — SODIUM CHLORIDE 0.9% FLUSH
3.0000 mL | Freq: Two times a day (BID) | INTRAVENOUS | Status: DC
  Administered 2021-10-15: 3 mL via INTRAVENOUS

## 2021-10-15 SURGICAL SUPPLY — 17 items
BALLN IN.PACT DCB 5X150 (BALLOONS) ×3
CATH MUSTANG 3X60X135 (BALLOONS) ×1 IMPLANT
CATH OMNI FLUSH 5F 65CM (CATHETERS) ×1 IMPLANT
CATH QUICKCROSS .035X135CM (MICROCATHETER) ×1 IMPLANT
CLOSURE PERCLOSE PROSTYLE (VASCULAR PRODUCTS) ×1 IMPLANT
DCB IN.PACT 5X150 (BALLOONS) IMPLANT
GLIDEWIRE ADV .035X260CM (WIRE) ×1 IMPLANT
KIT ENCORE 26 ADVANTAGE (KITS) ×1 IMPLANT
KIT MICROPUNCTURE NIT STIFF (SHEATH) ×1 IMPLANT
KIT PV (KITS) ×3 IMPLANT
SHEATH PINNACLE 5F 10CM (SHEATH) ×1 IMPLANT
SHEATH PINNACLE 6F 10CM (SHEATH) ×1 IMPLANT
SHEATH PINNACLE ST 6F 65CM (SHEATH) ×1 IMPLANT
SYR MEDRAD MARK V 150ML (SYRINGE) ×1 IMPLANT
TRANSDUCER W/STOPCOCK (MISCELLANEOUS) ×3 IMPLANT
TRAY PV CATH (CUSTOM PROCEDURE TRAY) ×3 IMPLANT
WIRE BENTSON .035X145CM (WIRE) ×1 IMPLANT

## 2021-10-15 NOTE — Progress Notes (Signed)
OT Cancellation Note ? ?Patient Details ?Name: Michael Riggs ?MRN: [id number] ?DOB: 1970/10/21 ? ? ?Cancelled Treatment:    Reason Eval/Treat Not Completed: Patient at procedure or test/ unavailable (HD) ? ?[name] ?10/15/2021, 7:51 AM ?Nestor Lewandowsky, OTR/L ?Acute Rehabilitation Services ?Pager: [phone] ?Office: [phone]  ?

## 2021-10-15 NOTE — Progress Notes (Signed)
Mobility Specialist Progress Note ? ? 10/15/21 1745  ?Mobility  ?Activity Transferred to/from [facility]  ?Level of Assistance +2 (takes two people) ?(+2 for safety)  ?Assistive Device Front wheel walker ?(+2 for saftey)  ?RLE Weight Bearing NWB  ?Distance Ambulated (ft) 2 ft  ?Activity Response Tolerated fair  ?$Mobility charge 1 Mobility  ? ?Received on EOB requesting to to use BSC. Upon tx would not abide by WB precautions on RLE and impulsively trying to sit down on [facility] w/o listening to cues given for sequencing or placement. Left on [facility] w/ call bell by side and NT in the room. ? ?Holland Falling ?Mobility Specialist ?Phone Number [phone] ? ?

## 2021-10-15 NOTE — Progress Notes (Signed)
Mobility Specialist Progress Note ? ? 10/15/21 1800  ?Mobility  ?Activity Transferred to/from [facility]  ?Level of Assistance Minimal assist, patient does 75% or more  ?Assistive Device Front wheel walker  ?RLE Weight Bearing NWB  ?Distance Ambulated (ft) 2 ft  ?Activity Response Tolerated well  ?$Mobility charge 1 Mobility  ? ?Pt more receptive to cues and WB precaution for this tx bed. Able to don prosthetic on w/o any issue. SPT to EOB w/ min A on intial incline, no complaints throughout. Left supine w/ call bell in room and needs met. ? ?Holland Falling ?Mobility Specialist ?Phone Number [phone] ?

## 2021-10-15 NOTE — Op Note (Signed)
? ? ?Patient name: Michael Riggs MRN: [id number] DOB: 1970/07/26 Sex: male ? ?10/15/2021 ?Pre-operative Diagnosis: Right lower extremity Rutherford 5 critical limb ischemia ?Post-operative diagnosis:  Same ?Surgeon:  Broadus John, MD ?Procedure Performed: ?1.  Ultrasound-guided micropuncture access of the left common femoral artery ?2.  Aortogram ?3.  Second-order cannulation, right lower extremity angiogram ?4.  Superficial femoral artery drug-coated balloon angioplasty using a 5 x 150 mm balloon ?5.  Anterior tibial artery, tibioperoneal trunk drug-coated balloon angioplasty using a 3 x 60 mm balloon ?6.  Access managed with Pro-glide ?7.  No sedation ?8.  Contrast 126 ? ? ?Indications:   ?Patient is a 51 year old male with right lower extremity Rutherford 5 critical limb ischemia.  The wounds on his right leg have failed to heal.  Imaging demonstrates concern for peripheral arterial disease.  After discussing the risk and benefits of right lower extremity angiography in an effort to define and improve distal perfusion for wound healing, [name] elected to proceed. ? ?Findings:  ?Aortogram: Bilateral renal arteries patent, no inflow disease appreciated ?On the right: Common femoral artery patent, normal profunda, superficial femoral artery with multiple tandem lesions with an area of flow-limiting stenosis - roughly 60%, at the distal aspect in Hunter's canal.  Popliteal artery without flow-limiting disease.  50% narrowing at the ostium of the anterior tibial artery, 70% narrowing at the proximal tibioperoneal trunk.  Three-vessel runoff to the foot with intact pedal arch.  Small vessel disease appreciated in the foot ?  ?Procedure:  The patient was identified in the holding area and taken to room 8.  The patient was then placed supine on the table and prepped and draped in the usual sterile fashion.  A time out was called.  Ultrasound was used to evaluate the left common femoral artery.  It was patent .   A digital ultrasound image was acquired.  A micropuncture needle was used to access the left common femoral artery under ultrasound guidance.  An 018 wire was advanced without resistance and a micropuncture sheath was placed.  The 018 wire was removed and a benson wire was placed.  The micropuncture sheath was exchanged for a 5 french sheath.  An omniflush catheter was advanced over the wire to the level of L-1.  An abdominal angiogram was obtained.  Next, using the omniflush catheter and a benson wire, the aortic bifurcation was crossed and the catheter was placed into theright external iliac artery and right runoff was obtained.   ? ?I like to intervene on the distal superficial femoral artery, proximal anterior tibial artery, tibioperoneal trunk. ?A 6 x 65 cm sheath was brought to the field and parked in the mid superficial femoral artery.  The patient was heparinized with 10,000 units IV heparin.  Next, a series of 035 wires and catheters were used to traverse the anterior tibial artery lesion.  A 3 x 60 mm balloon was brought onto the field and inflated for 2 minutes.  Follow-up angiography demonstrated improvement in the ostial stenosis to less than 20%, the same wire and balloon combination were used to traverse the tibioperoneal trunk.  True lumen was confirmed, and angioplasty followed.  This also demonstrated improvement with less than 50% stenosis.  Next, my attention turned to the distal superficial femoral artery.  A 5 x 150 mm drug-coated balloon was brought onto the field and expanded across the lesion for 3 minutes.  Follow-up angiography demonstrated improvement with less than 50% stenosis.  Disease was still appreciated  however the lesions were not flow-limiting. ? ?Impression: Successful balloon angioplasty of the distal superficial femoral artery, proximal anterior tibial artery, tibioperoneal trunk. ?Patient with an intact pedal arch of the foot.  Optimally revascularized at this time. ? ? ?J.  Melene Muller, MD ?[facility] ?Office: [phone] ? ? ? ?

## 2021-10-15 NOTE — Progress Notes (Signed)
Family Medicine Teaching Service ?Daily Progress Note ?Intern Pager: [phone] ? ?Patient name: Michael Riggs Medical record number: [id number] ?Date of birth: 11-04-1970 Age: 51 y.o. Gender: male ? ?Primary Care Provider: [name], MD ?Consultants: Infectious Disease, Nephrology, ortho, vascular surgery, wound care, neuro (S/O), cardiology (S/O), podiatry (S/O) ?Code Status: Full ? ?Pt Overview and Major Events to Date:  ?3/20: admitted ?3/24: wound debridement ? ?Assessment and Plan: ?Michael Riggs is a 51 y.o. male presenting with fall and near syncope questionable for stroke, new onset HFrEF and chronic R leg wound. PMH is significant for ESRD on HD, HTN, left BKA, HLD, T2DM, anemia of chronic disease, history of CVA, OSA. ? ?Infected chronic R leg wound ?Remains afebrile. Ambulates well with left BKA prosthesis.  Patient is additionally being evaluated for nutrient deficiencies regarding his poor ability of wound healing.  Vitamin D 21.82.  Abdominal aortogram rescheduled for today however n.p.o. order was not updated.  Patient ate a sandwich this morning and HD around 8:30 AM.  But is now n.p.o. ?-Orthopedic surgery following, appreciate recommendations ?-Vascular surgery following, appreciate recommendations ?-ID following, appreciate recommendations ?-Follow-up vitamin A, C, zinc ?-Abdominal aortogram today at 1330, n.p.o. for procedure ?-Continue cefepime (3/20-), vancomycin (3/27-) ?-Vitamin C 250 mg twice daily x30 days, zinc 220 mg x 14 days (3/28-) ?-Tylenol 1000 mg every 6 hours as needed, Oxy IR 5 mg every 6 hours as needed ?-Continue PT/OT ? ?Apneic episode: Resolved ?Patient doing well on room air and used CPAP last night.  Continues to appear well and no events on continuous cardiac monitoring.  Stable for aortogram tomorrow. ? ?ESRD on HD MWF ?HD today. ?-Nephrology following, appreciate recommendations ?-Midodrine 10 mg for HD ?-Benadryl 25 mg for HD ? ?History of CVA ?-Continue  aspirin 81 mg daily, Lipitor 40 mg daily ?-Plavix after procedures complete for DAPT recommendations by neurology ? ? ?FEN/GI: N.p.o. ?PPx: Heparin SQ every 8 hours ?Dispo:Home with home health . Barriers include abdominal aortogram and further recommendations by orthopedic and vascular surgery.  ? ?Subjective:  ?Patient is frustrated that he will have to wait so long to eat today.  Otherwise not in pain and breathing well and used his CPAP last night. ? ?Objective: ?Temp:  [97.1 ?F (36.2 ?C)-97.9 ?F (36.6 ?C)] 97.1 ?F (36.2 ?C) (03/29 0741) ?Pulse Rate:  [62-77] 62 (03/29 0747) ?Resp:  [8-20] 17 (03/29 0741) ?BP: (122-165)/(73-91) 122/74 (03/29 0747) ?SpO2:  [96 %-100 %] 100 % (03/29 0741) ?Weight:  [97.2 kg] 97.2 kg (03/29 0741) ?Physical Exam: ?General: Awake, alert, NAD, getting set up for HD ?Cardiovascular: RRR, no murmurs auscultated ?Respiratory: CTA B, no increased work of breathing, on room air ?Extremities: Left BKA, right leg wound wrapped in brace ? ?Laboratory: ?Recent Labs  ?Lab 10/12/21 ?0136 10/13/21 ?0939 10/14/21 ?1138  ?WBC 11.9* 13.4* 12.7*  ?HGB 14.0 12.3* 12.9*  ?HCT 43.4 38.4* 40.6  ?PLT 235 265 250  ? ?Recent Labs  ?Lab 10/13/21 ?0939 10/14/21 ?1138 10/15/21 ?0135  ?NA 127* 129* 129*  ?K 5.2* 4.6 5.5*  ?CL 92* 90* 90*  ?CO2 20* 26 24  ?BUN 94* 59* 76*  ?CREATININE 10.44* 7.89* 9.03*  ?CALCIUM 9.3 9.1 9.6  ?GLUCOSE 172* 183* 133*  ? ?Vitamin D: 21.8 ? ?Imaging/Diagnostic Tests: ?DG Chest 2 View ? ?Result Date: 10/14/2021 ?CLINICAL DATA:  Unresponsive episode. EXAM: CHEST - 2 VIEW COMPARISON:  Chest radiograph 10/06/2021 FINDINGS: The cardiac silhouette remains mildly enlarged. Density in the left lower hemithorax is similar to  the prior study and reflects a combination of a small to moderate pleural effusion and lung consolidation or atelectasis. The right lung remains grossly clear. No pneumothorax is identified. No acute osseous abnormality is seen. IMPRESSION: Persistent left pleural  effusion with left basilar atelectasis or consolidation. Electronically Signed   By: Logan Bores M.D.   On: 10/14/2021 13:48   ? ?Wells Guiles, DO ?10/15/2021, 8:26 AM ?PGY-1, [facility] ?[place] Intern pager: [phone], text pages welcome ? ?

## 2021-10-15 NOTE — Progress Notes (Signed)
?  Progress Note ? ? ? ?10/15/2021 ?7:11 AM ?5 Days Post-Op ? ?Patient was scheduled for Angiogram on 3/28. He had unresponsive event in holding area, became stable and back to baseline but case was cancelled. He remains at baseline with no further events. Angiogram is rescheduled to tomorrow 3/30. Keep NPO after midnight. Patients questions regarding procedure have been answered  ? ? ?Karoline Caldwell, PA-C ?[facility] ?[phone] ?10/15/2021 ?7:11 AM ?

## 2021-10-15 NOTE — Progress Notes (Signed)
Patient brought to 4E from Cath Lab. VSS. Telemetry box applied, CCMD notified. Left groin dressing with old drainage. Patient oriented to room and staff. Call bell in reach. ? ?Daymon Larsen, RN  ?

## 2021-10-15 NOTE — Progress Notes (Signed)
FPTS Interim Progress Note ? ?S: Patient sitting up when I enter the room.  Reports he is still having issues with reflux but no other concerns at this time.. ? ?O: ?BP (!) 100/47 (BP Location: Right Arm)   Pulse 70   Temp (!) 97.4 ?F (36.3 ?C) (Oral)   Resp 20   Ht 5\' 10"  (1.778 m)   Wt 94 kg   SpO2 100%   BMI 29.73 kg/m?   ?General: Sitting on the edge of the bed in no acute distress ?Resp: Normal work of breathing, speaking in full sentences ?CV: Regular rate ? ?A/P: ?Patient is doing well currently.  No changes to day team plan. ? ?Labs and orders reviewed. No new orders at this time. ? ?Gifford Shave, MD ?10/15/2021, 11:52 PM ?PGY-3, [place] ?Service pager [phone] ? ?

## 2021-10-15 NOTE — Progress Notes (Signed)
OT Cancellation Note ? ?Patient Details ?Name: Demarko Zeimet ?MRN: [id number] ?DOB: 07-20-1971 ? ? ?Cancelled Treatment:    Reason Eval/Treat Not Completed: Patient at procedure or test/ unavailable (cath lab) ? ?[name] ?10/15/2021, 1:37 PM ?Nestor Lewandowsky, OTR/L ?Acute Rehabilitation Services ?Pager: [phone] ?Office: [phone]  ?

## 2021-10-15 NOTE — Procedures (Signed)
I was present at this dialysis session. I have reviewed the session itself and made appropriate changes.  ? ?K 5.5 on 2K Bath.  UF goal 3.5L.  BPs stable. Using LUE AVF.  No c/o at this time from the patient.   ? ?Filed Weights  ? 10/13/21 1214 10/13/21 1623 10/15/21 0741  ?Weight: 105.1 kg 102.1 kg 97.2 kg  ? ? ?Recent Labs  ?Lab 10/15/21 ?0135  ?NA 129*  ?K 5.5*  ?CL 90*  ?CO2 24  ?GLUCOSE 133*  ?BUN 76*  ?CREATININE 9.03*  ?CALCIUM 9.6  ?PHOS 5.0*  ? ? ?Recent Labs  ?Lab 10/11/21 ?0615 10/12/21 ?0136 10/13/21 ?9163 10/14/21 ?1138  ?WBC 11.1* 11.9* 13.4* 12.7*  ?NEUTROABS 8.3* 8.8*  --  9.8*  ?HGB 14.1 14.0 12.3* 12.9*  ?HCT 44.8 43.4 38.4* 40.6  ?MCV 103.0* 101.9* 102.4* 102.5*  ?PLT 257 235 265 250  ? ? ?Scheduled Meds: ? (feeding supplement) PROSource Plus  30 mL Oral BID BM  ? vitamin C  250 mg Oral BID  ? aspirin EC  81 mg Oral Daily  ? atorvastatin  40 mg Oral QHS  ? buPROPion  300 mg Oral Daily  ? calcitRIOL  2 mcg Oral Q M,W,F-HD  ? Chlorhexidine Gluconate Cloth  6 each Topical Q0600  ? docusate sodium  100 mg Oral BID  ? feeding supplement (NEPRO CARB STEADY)  237 mL Oral Q24H  ? ferric citrate  630 mg Oral TID WC  ? heparin injection (subcutaneous)  5,000 Units Subcutaneous Q8H  ? metoprolol tartrate  25 mg Oral BID  ? midodrine  10 mg Oral Q M,W,F-HD  ? multivitamin  1 tablet Oral QHS  ? pantoprazole  40 mg Oral QHS  ? polyethylene glycol  17 g Oral Daily  ? ramelteon  8 mg Oral QHS  ? tamsulosin  0.4 mg Oral QHS  ? zinc sulfate  220 mg Oral Daily  ? ?Continuous Infusions: ? sodium chloride    ? ceFEPime (MAXIPIME) IV Stopped (10/13/21 1825)  ? vancomycin    ? ?PRN Meds:.acetaminophen, albuterol, diphenhydrAMINE, methocarbamol **OR** [DISCONTINUED] methocarbamol (ROBAXIN) IV, oxyCODONE   ?Pearson Grippe  MD ?10/15/2021, 8:14 AM ?  ?

## 2021-10-16 ENCOUNTER — Encounter (HOSPITAL_COMMUNITY): Payer: Self-pay | Admitting: Vascular Surgery

## 2021-10-16 ENCOUNTER — Other Ambulatory Visit (HOSPITAL_COMMUNITY): Payer: Self-pay

## 2021-10-16 DIAGNOSIS — L03115 Cellulitis of right lower limb: Secondary | ICD-10-CM | POA: Diagnosis not present

## 2021-10-16 DIAGNOSIS — I739 Peripheral vascular disease, unspecified: Secondary | ICD-10-CM | POA: Diagnosis not present

## 2021-10-16 DIAGNOSIS — N186 End stage renal disease: Secondary | ICD-10-CM | POA: Diagnosis not present

## 2021-10-16 DIAGNOSIS — Z992 Dependence on renal dialysis: Secondary | ICD-10-CM | POA: Diagnosis not present

## 2021-10-16 LAB — BASIC METABOLIC PANEL
Anion gap: 13 (ref 5–15)
BUN: 47 mg/dL — ABNORMAL HIGH (ref 6–20)
CO2: 26 mmol/L (ref 22–32)
Calcium: 9.4 mg/dL (ref 8.9–10.3)
Chloride: 91 mmol/L — ABNORMAL LOW (ref 98–111)
Creatinine, Ser: 6.57 mg/dL — ABNORMAL HIGH (ref 0.61–1.24)
GFR, Estimated: 10 mL/min — ABNORMAL LOW (ref >60)
Glucose, Bld: 153 mg/dL — ABNORMAL HIGH (ref 70–99)
Potassium: 4.1 mmol/L (ref 3.5–5.1)
Sodium: 130 mmol/L — ABNORMAL LOW (ref 135–145)

## 2021-10-16 LAB — ZINC: Zinc: 77 ug/dL (ref 44–115)

## 2021-10-16 MED ORDER — VANCOMYCIN HCL IN DEXTROSE 1-5 GM/200ML-% IV SOLN: 1000.0000 mg | INTRAVENOUS | Status: AC

## 2021-10-16 MED ORDER — CLOPIDOGREL BISULFATE 75 MG PO TABS: 75.0000 mg | ORAL_TABLET | Freq: Every day | ORAL | 2 refills | Status: DC

## 2021-10-16 MED ORDER — METOPROLOL TARTRATE 25 MG PO TABS
25.0000 mg | ORAL_TABLET | Freq: Two times a day (BID) | ORAL | 0 refills | Status: DC
  Filled 2021-10-16: qty 60, 30d supply, fill #0

## 2021-10-16 MED ORDER — ZINC SULFATE 220 (50 ZN) MG PO TABS
220.0000 mg | ORAL_TABLET | Freq: Every day | ORAL | 0 refills | Status: AC
  Filled 2021-10-16: qty 11, 11d supply, fill #0

## 2021-10-16 MED ORDER — BUPROPION HCL ER (XL) 300 MG PO TB24
300.0000 mg | ORAL_TABLET | Freq: Every morning | ORAL | Status: DC
Start: 2021-10-16 — End: 2022-01-16

## 2021-10-16 MED ORDER — SODIUM CHLORIDE 0.9 % IV SOLN: 2.0000 g | INTRAVENOUS | Status: AC

## 2021-10-16 MED ORDER — ASPIRIN 81 MG PO TBEC
81.0000 mg | DELAYED_RELEASE_TABLET | Freq: Every day | ORAL | 11 refills | Status: DC
  Filled 2021-10-16: qty 30, 30d supply, fill #0

## 2021-10-16 MED ORDER — CALCITRIOL 0.5 MCG PO CAPS: 2.0000 ug | ORAL_CAPSULE | ORAL | Status: DC

## 2021-10-16 NOTE — Discharge Instructions (Addendum)
? ?[facility] ? ?Discharge Instructions ? ?Lower Extremity Angiogram; Angioplasty/Stenting ? ?Please refer to the following instructions for your post-procedure care. Your surgeon or physician assistant will discuss any changes with you. ? ?Activity ? ?Avoid lifting more than 8 pounds (1 gallons of milk) for 72 hours (3 days) after your procedure. You may walk as much as you can tolerate. It's OK to drive after 72 hours. ? ?Bathing/Showering ? ?You may shower the day after your procedure. If you have a bandage, you may remove it at 24- 48 hours. Clean your incision site with mild soap and water. Pat the area dry with a clean towel. ? ?Diet ? ?Resume your pre-procedure diet. There are no special food restrictions following this procedure. All patients with peripheral vascular disease should follow a low fat/low cholesterol diet. In order to heal from your surgery, it is CRITICAL to get adequate nutrition. Your body requires vitamins, minerals, and protein. Vegetables are the best source of vitamins and minerals. Vegetables also provide the perfect balance of protein. Processed food has little nutritional value, so try to avoid this. ? ?Medications ? ?Resume taking all of your medications unless your doctor tells you not to. If your incision is causing pain, you may take over-the-counter pain relievers such as acetaminophen (Tylenol) ? ?Follow Up ? ?Follow up will be arranged at the time of your procedure. You may have an office visit scheduled or may be scheduled for surgery. Ask your surgeon if you have any questions. ? ?Please call us immediately for any of the following conditions: ?Severe or worsening pain your legs or feet at rest or with walking. ?Increased pain, redness, drainage at your groin puncture site. ?Fever of 101 degrees or higher. ?If you have any mild or slow bleeding from your puncture site: lie down, apply firm constant pressure over the area with a piece of gauze  or a clean wash cloth for 30 minutes- no peeking!, call 911 right away if you are still bleeding after 30 minutes, or if the bleeding is heavy and unmanageable. ? ?Reduce your risk factors of vascular disease: ? ?Stop smoking. If you would like help call [facility] at 1-800-QUIT-NOW [phone]) or [facility] [facility] at [phone]. ?Manage your cholesterol ?Maintain a desired weight ?Control your diabetes ?Keep your blood pressure down ? ?If you have any questions, please call the office at [phone] ? ? ? ? ?Dear Michael Riggs,  ? ?Thank you for letting us participate in your care! In this section, you will find a brief hospital admission summary of why you were admitted to the hospital, what happened during your admission, your diagnosis/diagnoses, and recommended follow up.  ?You were admitted because you were experiencing chronic R leg wound with concerns for infection. This was treated with antibiotics and will continue to be treated by antibiotics during hemodialysis until 11/21/21. You have several further medical concerns that will need to follow up on which will be listed below.  ? ?Cardiology follow up outpatient for left heart catheterization - Phineas Inches (see below) ?Follow up with stroke clinic NP at [facility] in 3 weeks ?Follow up with Vascular surgery in 4-6 weeks - 11/28/21 (see below) ?Follow up with Orthopedic surgery (Dr. Sharol Given) in 1 week for wound vac ? ?POST-HOSPITAL & CARE INSTRUCTIONS ?Please let PCP/Specialists know of any changes in medications that were made.  ?Please see medications section of this packet for any medication changes.  ? ?DOCTOR'S APPOINTMENTS & FOLLOW UP ?Future Appointments  ?  Date Time Provider [place]  ?10/20/2021  8:40 AM Branch, Royetta Crochet, MD [facility]  ?11/28/2021 12:00 PM [facility] VASC 4 - SS [facility]  ?11/28/2021 12:30 PM [facility] HS VASC 4 - SS [facility]  ?11/28/2021  1:00 PM [facility]  ? ? ?Thank you for choosing [facility]!  Take care and be well! ? ?Wells Guiles, DO ?10/16/2021, 11:15 AM ?PGY-1, [place] ? ?Family Medicine Teaching Service Inpatient Team ?[place]  ?[facility]  ?[address] [place], [place] [address] ?[phone] ? ?

## 2021-10-16 NOTE — Progress Notes (Addendum)
Contacted by pharmacy staff regarding pt's need for iv antibiotics with HD. Renal providers aware of this need and will provide orders to out-pt HD clinic. Contacted [place] and spoke to [place], Therapist, sports. Clinic has cefepime and vancomycin available to meet pt's needs. Will assist as needed.  ? ?Melven Sartorius ?Renal Navigator ?[phone] ? ?Addendum at 12:19 pm: ?Contacted clinic to make them aware of pt's d/c today. Clinic advised pt should resume care tomorrow.  ?

## 2021-10-16 NOTE — Progress Notes (Addendum)
?[name] ?Progress Note  ? ?Subjective: S/P successful  anterior tibial artery, tibioperoneal trunk drug-coated balloon angioplasty, Superficial femoral artery drug-coated balloon angioplasty 10/15/2021 per Dr. Virl Cagey. Being discharged today. Will be on Vanc/Cefepime until 11/21/2021 with HD for polymicrobial infection R ankle.  ? ? ?Objective ?Vitals:  ? 10/15/21 1623 10/15/21 1942 10/16/21 0239 10/16/21 0800  ?BP: 137/86 (!) 100/47 (!) 141/90 (!) 147/90  ?Pulse: 63 70 77   ?Resp: 20 20 20 20   ?Temp: 97.8 ?F (36.6 ?C) (!) 97.4 ?F (36.3 ?C) 97.8 ?F (36.6 ?C) 98.4 ?F (36.9 ?C)  ?TempSrc: Oral Oral Oral Oral  ?SpO2: 97% 100% 100% 99%  ?Weight:      ?Height:      ? ?Physical Exam ?General: Pleasant chronically ill appearing male in NAD ?Heart: S1 S2 distinct. No MRG. ?Lungs: CTAB. Sating 100 on RA ?Abdomen: soft NT, ND ?Extremities: L BKA. No stump edema. RLE with drsg intact R ankle with [place]. Very trace RLE edema.    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  ? ? ?Additional Objective ?Labs: ?Basic Metabolic Panel: ?Recent Labs  ?Lab 10/12/21 ?0136 10/13/21 ?7628 10/14/21 ?1138 10/15/21 ?0135 10/16/21 ?0242  ?NA 130* 127* 129* 129* 130*  ?K 4.8 5.2* 4.6 5.5* 4.1  ?CL 93* 92* 90* 90* 91*  ?CO2 23 20* 26 24 26   ?GLUCOSE 170* 172* 183* 133* 153*  ?BUN 65* 94* 59* 76* 47*  ?CREATININE 8.14* 10.44* 7.89* 9.03* 6.57*  ?CALCIUM 9.6 9.3 9.1 9.6 9.4  ?PHOS 4.7* 5.3*  --  5.0*  --   ? ?Liver Function Tests: ?Recent Labs  ?Lab 10/12/21 ?0136 10/13/21 ?3151 10/15/21 ?0135  ?ALBUMIN 3.0* 2.8* 2.9*  ? ?No results for input(s): LIPASE, AMYLASE in the last 168 hours. ?CBC: ?Recent Labs  ?Lab 10/10/21 ?0037 10/10/21 ?7616 10/11/21 ?0615 10/12/21 ?0136 10/13/21 ?0737 10/14/21 ?1138  ?WBC 12.5*  --  11.1* 11.9* 13.4* 12.7*  ?NEUTROABS 8.5*  --  8.3* 8.8*  --  9.8*  ?HGB 13.5   < > 14.1 14.0 12.3* 12.9*  ?HCT 42.4   < > 44.8 43.4 38.4* 40.6  ?MCV 101.9*  --  103.0* 101.9*  102.4* 102.5*  ?PLT 250  --  257 235 265 250  ? < > = values in this interval not displayed.  ? ?Blood Culture ?   ?Component Value Date/Time  ? SDES TISSUE 10/10/2021 1016  ? SPECREQUEST RIGHT ACHILLES DEBRIDEMENT [place] A 10/10/2021 1016  ? CULT  10/10/2021 1016  ?  ABUNDANT MORGANELLA MORGANII ?ABUNDANT SERRATIA MARCESCENS ?ABUNDANT CORYNEBACTERIUM SPECIES ?Standardized susceptibility testing for this organism is not available. ?NO ANAEROBES ISOLATED ?Performed at [facility], [place] [address] [place], [place] [address] ?  ? REPTSTATUS 10/15/2021 FINAL 10/10/2021 1016  ? ? ?Cardiac Enzymes: ?No results for input(s): CKTOTAL, CKMB, CKMBINDEX, TROPONINI in the last 168 hours. ?CBG: ?Recent Labs  ?Lab 10/10/21 ?0747 10/10/21 ?1030 10/10/21 ?1131 10/10/21 ?1646 10/14/21 ?0851  ?GLUCAP 184* 130* 89 114* 162*  ? ?Iron Studies: No results for input(s): IRON, TIBC, TRANSFERRIN, FERRITIN in the last 72 hours. ?@lablastinr3 @ ?Studies/Results: ?DG Chest 2 View ? ?Result Date: 10/14/2021 ?CLINICAL DATA:  Unresponsive episode. EXAM: CHEST - 2 VIEW COMPARISON:  Chest radiograph 10/06/2021 FINDINGS: The cardiac silhouette remains mildly enlarged. Density in the left lower hemithorax is similar to the prior study and reflects a combination of a small to moderate pleural effusion and lung consolidation or atelectasis. The right lung  remains grossly clear. No pneumothorax is identified. No acute osseous abnormality is seen. IMPRESSION: Persistent left pleural effusion with left basilar atelectasis or consolidation. Electronically Signed   By: Logan Bores M.D.   On: 10/14/2021 13:48  ? ?PERIPHERAL VASCULAR CATHETERIZATION ? ?Result Date: 10/15/2021 ?Images from the original result were not included. Patient name: Michael Riggs MRN: [id number] DOB: [birth year] Sex: male 10/15/2021 Pre-operative Diagnosis: Right lower extremity [place]utherford 5 critical limb ischemia Post-operative diagnosis:  Same Surgeon:  Broadus John, MD  Procedure Performed: 1.  Ultrasound-guided micropuncture access of the left common femoral artery 2.  Aortogram 3.  Second-order cannulation, right lower extremity angiogram 4.  Superficial femoral artery drug-coated balloon angioplasty using a 5 x 150 mm balloon 5.  Anterior tibial artery, tibioperoneal trunk drug-coated balloon angioplasty using a 3 x 60 mm balloon 6.  Access managed with Pro-glide 7.  No sedation 8.  Contrast 126 Indications:  Patient is a 51 year old male with right lower extremity Rutherford 5 critical limb ischemia.  The wounds on his right leg have failed to heal.  Imaging demonstrates concern for peripheral arterial disease.  After discussing the risk and benefits of right lower extremity angiography in an effort to define and improve distal perfusion for wound healing, Michael Riggs elected to proceed. Findings: Aortogram: Bilateral renal arteries patent, no inflow disease appreciated On the right: Common femoral artery patent, normal profunda, superficial femoral artery with multiple tandem lesions with an area of flow-limiting stenosis - roughly 60%, at the distal aspect in Hunter's canal.  Popliteal artery without flow-limiting disease.  50% narrowing at the ostium of the anterior tibial artery, 70% narrowing at the proximal tibioperoneal trunk.  Three-vessel runoff to the foot with intact pedal arch.  Small vessel disease appreciated in the foot  Procedure:  The patient was identified in the holding area and taken to room 8.  The patient was then placed supine on the table and prepped and draped in the usual sterile fashion.  A time out was called.  Ultrasound was used to evaluate the left common femoral artery.  It was patent .  A digital ultrasound image was acquired.  A micropuncture needle was used to access the left common femoral artery under ultrasound guidance.  An 018 wire was advanced without resistance and a micropuncture sheath was placed.  The 018 wire was removed and a benson  wire was placed.  The micropuncture sheath was exchanged for a 5 french sheath.  An omniflush catheter was advanced over the wire to the level of L-1.  An abdominal angiogram was obtained.  Next, using the omniflush catheter and a benson wire, the aortic bifurcation was crossed and the catheter was placed into theright external iliac artery and right runoff was obtained.  I like to intervene on the distal superficial femoral artery, proximal anterior tibial artery, tibioperoneal trunk. A 6 x 65 cm sheath was brought to the field and parked in the mid superficial femoral artery.  The patient was heparinized with 10,000 units IV heparin.  Next, a series of 035 wires and catheters were used to traverse the anterior tibial artery lesion.  A 3 x 60 mm balloon was brought onto the field and inflated for 2 minutes.  Follow-up angiography demonstrated improvement in the ostial stenosis to less than 20%, the same wire and balloon combination were used to traverse the tibioperoneal trunk.  True lumen was confirmed, and angioplasty followed.  This also demonstrated improvement with less than 50% stenosis.  Next,  my attention turned to the distal superficial femoral artery.  A 5 x 150 mm drug-coated balloon was brought onto the field and expanded across the lesion for 3 minutes.  Follow-up angiography demonstrated improvement with less than 50% stenosis.  Disease was still appreciated however the lesions were not flow-limiting. Impression: Successful balloon angioplasty of the distal superficial femoral artery, proximal anterior tibial artery, tibioperoneal trunk. Patient with an intact pedal arch of the foot.  Optimally revascularized at this time. Cassandria Santee, MD [facility] Office: [phone]   ?Medications: ? sodium chloride    ? sodium chloride    ? ceFEPime (MAXIPIME) IV 2 g (10/15/21 1820)  ? vancomycin 1,000 mg (10/15/21 1103)  ? ? (feeding supplement) PROSource Plus  30 mL Oral BID  BM  ? vitamin C  250 mg Oral BID  ? aspirin EC  81 mg Oral Daily  ? atorvastatin  40 mg Oral QHS  ? buPROPion  300 mg Oral Daily  ? calcitRIOL  2 mcg Oral Q M,W,F-HD  ? Chlorhexidine Gluconate Cloth  6 each

## 2021-10-16 NOTE — Progress Notes (Signed)
Antibiotic Therapy Plan: ? ?Indication: Right lower extremity wound/? Possible osteo  ?Regimen: Vancomycin 1000 mg with HD every MWF + Cefepime 2 gm with HD every MWF  ?End date: 11/21/21 ? ?NOTE since patient will receive antibiotics with HD a formal OPAT consult will not be done.  ?  ? ?Thank you for allowing pharmacy to be a part of this patient's care. ? ?Jimmy Footman, PharmD, BCPS, BCIDP ?Infectious Diseases Clinical Pharmacist ?Phone: [phone] ?10/16/2021, 9:16 AM ? ?

## 2021-10-16 NOTE — Progress Notes (Signed)
Pt Thayer home with RW and 3-in-1 delivered.  Vac changed to preveena prior to DC.  All instructions given and reviewed, all questions answered. ?

## 2021-10-16 NOTE — Discharge Summary (Signed)
Family Medicine Teaching Service ?Hospital Discharge Summary ? ?Patient name: Michael Riggs Medical record number: [id number] ?Date of birth: April 07, 1971 Age: 51 y.o. Gender: male ?Date of Admission: 10/06/2021  Date of Discharge: 10/16/2021 ?Admitting Physician: Lind Covert, MD ? ?Primary Care Provider: [name], MD ?Consultants: Infectious Disease, nephrology, orthopedic surgery, vascular surgery, wound care, neurology, cardiology, podiatry ? ?Indication for Hospitalization: chronic R leg wound ? ?Discharge Diagnoses/Problem List:  ?Principal Problem: ?  Nonhealing wound of heel ?Active Problems: ?  ESRD (end stage renal disease) ([place]) ?  Cerebral thrombosis with cerebral infarction ?  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  PVD (peripheral vascular disease) ([place]) ?  Cellulitis of right lower leg ?  ?Disposition: Home ? ?Discharge Condition: Stable ? ?Discharge Exam:  ?Blood pressure (!) 147/90, pulse 77, temperature 98.4 ?F (36.9 ?C), temperature source Oral, resp. rate 20, height 5\' 10"  (1.778 m), weight 94 kg, SpO2 99 %.  ?General: awake, alert, NAD ?Cardiovascular: RRR, no murmurs auscultated ?Respiratory: CTAB, no increased work of breathing, on room air ?Extremities: left BKA, right leg wound wrapped in brace ? ?Brief Hospital Course:  ?Michael Riggs is a 51 y.o.male with a history of ESRD on HD, HTN, left BKA, HLD, T2DM, anemia of chronic disease, history of CVA, OSA who was admitted to the [facility] Teaching Service at [facility] for infected chronic R leg wound and fall with near syncope. His hospital course is detailed below: ? ?Infected chronic R leg wound ?Patient presented due to chronic right lower extremity wound with failed antibiotic (clindamycin) therapy.  Right ankle x-ray shows possible nondisplaced distal tibial fracture.  Right ankle MRI was performed but was severely motion degraded but did not show evidence of acute osseous abnormality and only mild  tenosynovitis of the flexor tendons within the forefoot.  Podiatry was consulted and recommended ABIs and orthopedic consult.  Wound care was also consulted and provided recommendations for RLE wound. Patient was started on vancomycin and cefepime as well as obtain blood cultures x2.  Blood cultures showed positive for strep species.  Discussed with ID who believes to be contaminant.  ABIs showed noncompressible right lower extremity arteries.  Right toe brachial was mild/moderately decreased.  Orthopedics performed surgical debridement on 3/24. Aortogram on 3/29 was performed and obtained successful balloon angioplasty of the distal superficial femoral artery, proximal anterior tibial artery, tibioperoneal trunk with drug-eluting angioplasty performed. ID advised 6 weeks total of antibiotics given calcaneus involvement in debridement which patient can receive during HD treatments. Vitamin and micronutrient labs drawn given poor wound healing. Pt was started on Vit C, zinc supplementation. Discharged with wound vac in place and to follow up with Ortho in 1 week. ? ?Fall with near syncope ?Head CT shows 13 mm infarct within the ventral and central pons which is new compared to head CT of 08/30/2021 but no acute posttraumatic intracranial findings. MRI shows punctate acute infarcts versus artifact within the right parietal occipital cortex and left ventral pons. Neuro stated that these foci were motion artifacts and not true infarcts but did recommend baby aspirin given history of previous strokes and statin. They also recommended DAPT after all procedures have been completed. Echo was obtained for possible cardiac etiology of falls/near syncopal events and showed LVEF 40% and global hypokinesis of LV with Grade 3 diastolic dysfunction (restrictive). Cardiology was consulted and they recommended left heart cath given low EF but given low troponin, recommended this be done as an outpatient. Pt received  loading dose of  Plavix after aortogram and instructions to take plavix 75mg  daily, lipitor 40mg  daily, ASA 81mg  daily.  ? ?ESRD on HD MWF ?Nephrology was consulted and managed patient's dialysis. ? ?Other chronic conditions were medically managed with home medications and formulary alternatives as necessary (HTN, HLD, anemia of chronic disease, T2DM, hx of stroke, OSA) ? ?PCP Follow-up Recommendations: ?Follow L pleural effusion in the outpatient setting ?Cardiology follow up outpatient for LHC ?Continue Cefepime and Vancomycin for 6 weeks total during HD (end date 11/21/21) ?Follow up with stroke clinic NP at [facility] in 3 weeks ?Follow up with Vascular surgery in 4-6 weeks ?Follow up with Orthopedic surgery (Dr. Sharol Given) in 1 week for wound vac ?Hepatitis B vaccination (HBsAb negative)  ? ?Significant Procedures: Surgical debridement of RLE, abdominal aortogram w/ balloon angioplasty ? ?Significant Labs and Imaging:  ?Recent Labs  ?Lab 10/12/21 ?0136 10/13/21 ?0939 10/14/21 ?1138  ?WBC 11.9* 13.4* 12.7*  ?HGB 14.0 12.3* 12.9*  ?HCT 43.4 38.4* 40.6  ?PLT 235 265 250  ? ?Recent Labs  ?Lab 10/12/21 ?0136 10/13/21 ?2956 10/14/21 ?1138 10/15/21 ?0135 10/16/21 ?0242  ?NA 130* 127* 129* 129* 130*  ?K 4.8 5.2* 4.6 5.5* 4.1  ?CL 93* 92* 90* 90* 91*  ?CO2 23 20* 26 24 26   ?GLUCOSE 170* 172* 183* 133* 153*  ?BUN 65* 94* 59* 76* 47*  ?CREATININE 8.14* 10.44* 7.89* 9.03* 6.57*  ?CALCIUM 9.6 9.3 9.1 9.6 9.4  ?MG  --   --  2.0  --   --   ?PHOS 4.7* 5.3*  --  5.0*  --   ?ALBUMIN 3.0* 2.8*  --  2.9*  --   ? ? ?Results/Tests Pending at Time of Discharge: Vitamin A, Vitamin C ? ?Discharge Medications:  ?Allergies as of 10/16/2021   ? ?   Reactions  ? Tape Itching  ? Prefers silk tape over regular tape  ? ?  ? ?  ?Medication List  ?  ? ?STOP taking these medications   ? ?bromocriptine 2.5 MG tablet ?Commonly known as: PARLODEL ?  ?clindamycin 150 MG capsule ?Commonly known as: CLEOCIN ?  ?diclofenac Sodium 1 % Gel ?Commonly known as: VOLTAREN ?   ?diphenhydrAMINE 25 MG tablet ?Commonly known as: BENADRYL ?  ?diphenhydramine-acetaminophen 25-500 MG Tabs tablet ?Commonly known as: TYLENOL PM ?  ?DULoxetine 20 MG capsule ?Commonly known as: CYMBALTA ?  ?Gerhardt's butt cream Crea ?  ?mupirocin ointment 2 % ?Commonly known as: BACTROBAN ?  ?NYQUIL PO ?  ?sildenafil 100 MG tablet ?Commonly known as: VIAGRA ?  ?traMADol 50 MG tablet ?Commonly known as: ULTRAM ?  ? ?  ? ?TAKE these medications   ? ?acetaminophen 500 MG tablet ?Commonly known as: TYLENOL ?Take 1,000 mg by mouth every 6 (six) hours as needed for mild pain. ?  ?albuterol 108 (90 Base) MCG/ACT inhaler ?Commonly known as: VENTOLIN HFA ?Inhale 2 puffs into the lungs every 6 (six) hours as needed for wheezing or shortness of breath. ?  ?amLODipine 5 MG tablet ?Commonly known as: NORVASC ?Take 5 mg by mouth at bedtime. ?  ?aspirin 81 MG EC tablet ?Take 1 tablet (81 mg total) by mouth daily. Swallow whole. ?Start taking on: October 17, 2021 ?  ?atorvastatin 40 MG tablet ?Commonly known as: LIPITOR ?Take 1 tablet (40 mg total) by mouth daily. ?What changed: when to take this ?  ?augmented betamethasone dipropionate 0.05 % cream ?Commonly known as: DIPROLENE-AF ?Apply 1 application. topically daily. ?  ?buPROPion 300 MG  24 hr tablet ?Commonly known as: WELLBUTRIN XL ?Take 1 tablet (300 mg total) by mouth in the morning. ?What changed: when to take this ?  ?calcitRIOL 0.5 MCG capsule ?Commonly known as: ROCALTROL ?Take 4 capsules (2 mcg total) by mouth every Monday, Wednesday, and Friday with hemodialysis. ?Start taking on: October 17, 2021 ?  ?ceFEPIme 2 g in sodium chloride 0.9 % 100 mL ?Inject 2 g into the vein every Monday, Wednesday, and Friday at 6 PM. ?Start taking on: October 17, 2021 ?  ?clopidogrel 75 MG tablet ?Commonly known as: PLAVIX ?Take 1 tablet (75 mg total) by mouth daily with breakfast. ?  ?ferric citrate 1 GM 210 MG(Fe) tablet ?Commonly known as: AURYXIA ?Take 630 mg by mouth 3 (three) times  daily with meals. ?  ?ipratropium-albuterol 0.5-2.5 (3) MG/3ML Soln ?Commonly known as: DUONEB ?Take 3 mLs by nebulization every 6 (six) hours as needed (For shortness of breath). ?  ?Melatonin 10 MG Tabs ?Take 10 mg b

## 2021-10-16 NOTE — TOC Transition Note (Addendum)
Transition of Care (TOC) - CM/SW Discharge Note ?Marvetta Gibbons Therapist, sports, BSN ?Transitions of Care ?Unit 4E- RN Case Manager ?See Treatment Team for direct phone #  ? ? ?Patient Details  ?Name: Michael Riggs ?MRN: [id number] ?Date of Birth: 10/03/1970 ? ?Transition of Care (TOC) CM/SW Contact:  ?Dahlia Client, Romeo Rabon, RN ?Phone Number: ?10/16/2021, 11:47 AM ? ? ?Clinical Narrative:    ?Pt stable for transition home today, pt has declined [facility] services, no DME needs noted. Pt to go home w/ Pre[facility]a wound VAC, and receive IV abx w/ HD treatment. No further TOC needs noted for transition home.  ? ?1400- notified by PT/OT that pt needs DME for home- recs for R-drop arm BSC and 24in slide board. CM spoke with pt at bedside - pt agreeable to using in house provider for DME needs- again discussed [facility] services- pt still declines PT/OT at home at this time. MD contacted for DME orders.  ?Call made to [employer]- spoke with Maudie Mercury and DME referral for slide board and BSC given- DME to be delivered to room prior to discharge.  ? ? ?Final next level of care: Home/Self Care ?Barriers to Discharge: Barriers Resolved ? ? ?Patient Goals and CMS Choice ?Patient states their goals for this hospitalization and ongoing recovery are:: to return to home ?CMS Medicare.gov Compare Post Acute Care list provided to:: Patient ?Choice offered to / list presented to : Patient ? ?Discharge Placement ?  ?           ?  ? Home ?  ?  ? ?Discharge Plan and Services ?  ?Discharge Planning Services: CM Consult ?Post Acute Care Choice: Home Health          ?DME Arranged: N/A ?DME Agency: NA ?  ?  ?  ?HH Arranged: Refused HH ?[place] Agency: NA ?  ?  ?  ? ?Social Determinants of Health (SDOH) Interventions ?  ? ? ?Readmission Risk Interventions ? ?  10/16/2021  ? 11:47 AM  ?Readmission Risk Prevention Plan  ?Transportation Screening Complete  ?PCP or Specialist Appt within 3-5 Days Complete  ?[place] or Home Care Consult Complete  ?Social Work Consult for [place]  Planning/Counseling Complete  ?Palliative Care Screening Not Applicable  ?Medication Review Press photographer) Complete  ? ? ? ? ? ?

## 2021-10-16 NOTE — Plan of Care (Signed)
  Problem: Health Behavior/Discharge Planning: Goal: Ability to manage health-related needs will improve Outcome: Not Progressing   

## 2021-10-16 NOTE — Progress Notes (Signed)
Physical Therapy Treatment ?Patient Details ?Name: Michael Riggs ?MRN: [id number] ?DOB: 1970/09/11 ?Today's Date: 10/16/2021 ? ? ?History of Present Illness Pt is a 51 y/o M presenting to ED for facial and L knee pain after fall/near syncopal episode. He is now s/p R achilles debridement and tissue graft with application of wound VAC on 10/10/2021. PMH includes L BKA, ESRD on HD, HTN, DM2, CVA and anemia of chronic disease. ? ?  ?PT Comments  ? ? Pt received seated EOB, agreeable to therapy session with emphasis on transfer training and instruction on RLE NWB precautions. Pt demonstrates poor compliance with NWB precautions despite verbal cues and needs minA to maintain precautions, especially during stand pivot with RW. Pt not able to safely use RW for stand pivot transfers without +2 assist due to impulsivity and decreased balance with single leg stance on R prosthetic limb, so currently continue to recommend use of slide board and drop arm BSC/wheelchair for transfers. Discussed car transfer safety using slide board and pt able to demo SB transfer with up to Supervision, case mgr notified of pt DME needs. Pt continues to benefit from PT services to progress toward functional mobility goals. Discussed with pt benefits of a few sessions of HHPT to work on balance due to NWB RLE and for training with RW, he would also benefit from OPPT instead of HHPT if agreeable and transportation available.  ?Recommendations for follow up therapy are one component of a multi-disciplinary discharge planning process, led by the attending physician.  Recommendations may be updated based on patient status, additional functional criteria and insurance authorization. ? ?Follow Up Recommendations ? Home health PT ?  ?  ?Assistance Recommended at Discharge Set up Supervision/Assistance  ?Patient can return home with the following A little help with walking and/or transfers;A little help with bathing/dressing/bathroom;Assistance with  cooking/housework;Assist for transportation ?  ?Equipment Recommendations ? Other (comment) (Slide board, drop arm BSC)  ?  ?Recommendations for Other Services   ? ? ?  ?Precautions / Restrictions Precautions ?Precautions: Fall ?Precaution Comments: RLE NWB and pt noncompliant during pivotal trnasfers; L BKA at baseline, impulsivity ?Required Braces or Orthoses: Other Brace ?Other Brace: Podus boot (light gray PRAFO without kickstand) ?Restrictions ?Weight Bearing Restrictions: Yes ?RLE Weight Bearing: Non weight bearing  ?  ? ?Mobility ? Bed Mobility ?   ?General bed mobility comments: seated EOB on arrival ?  ? ?Transfers ?Overall transfer level: Needs assistance ?Equipment used: None, Rolling walker (2 wheels), Sliding board ?Transfers: Bed to chair/wheelchair/BSC, Sit to/from Stand ?Sit to Stand: Min assist, +2 safety/equipment ?Stand pivot transfers: Min assist, +2 safety/equipment ?  ?Squat pivot transfers: Min assist ?  ? Lateral/Scoot Transfers: Supervision ?General transfer comment: Pt squat pivot on LLE prosthesis from transport WC<>toilet, pt unsafe technique and impulsive due to bowel urgency; minA for safety with stand pivot using RW to elevated BSC, pt defers using BSC to toilet due to "shy bladder" but agreeable to practice transfer for instruction on proper technique; pt noncompliant each transfer with NWB RLE precautions needing minA at least for lifting RLE to maintain precs ?  ? ?  ?Balance Overall balance assessment: Needs assistance ?Sitting-balance support: Feet supported ?Sitting balance-Leahy Scale: Good ?Sitting balance - Comments: Able to don prothesis in sitting and maintain balance ?  ?  ?Standing balance-Leahy Scale: Poor ?Standing balance comment: Requires support to maintain balance, able to static stand with minA +2 (needs assist to prevent RLE NWB) with BUE support ?  ?  ?  ?   ?  Cognition Arousal/Alertness: Awake/alert ?Behavior During Therapy: Impulsive ?Overall Cognitive Status:  History of cognitive impairments - at baseline ?  ?   ?  ?General Comments: Pt endorses cognitive changes since CVA; pt not listening to instructions for improved technique to prevent RLE NWB; impulsive ?  ?  ? ?  ?Exercises   ? ?  ?General Comments General comments (skin integrity, edema, etc.): noted multiple small wounds on RLE (chronic), pt with bandaid on R hand in two areas, pt denies pain and does endorse LBP during HD sessions, recommend pressure relief geomat cushion, secretary notified to order. ?  ?  ? ?Pertinent Vitals/Pain Pain Assessment ?Pain Assessment: No/denies pain ?Pain Intervention(s): Monitored during session, Repositioned  ? ? ? ?PT Goals (current goals can now be found in the care plan section) Acute Rehab PT Goals ?Patient Stated Goal: To get back to walking and return home. ?PT Goal Formulation: With patient ?Time For Goal Achievement: 10/22/21 ?Progress towards PT goals: Progressing toward goals ? ?  ?Frequency ? ? ? Min 3X/week ? ? ? ?  ?PT Plan Current plan remains appropriate  ? ? ?   ?AM-PAC PT "6 Clicks" Mobility   ?Outcome Measure ? Help needed turning from your back to your side while in a flat bed without using bedrails?: None ?Help needed moving from lying on your back to sitting on the side of a flat bed without using bedrails?: A Little ?Help needed moving to and from a bed to a chair (including a wheelchair)?: A Little (safety cues) ?Help needed standing up from a chair using your arms (e.g., wheelchair or bedside chair)?: A Lot (mod safety cues) ?Help needed to walk in hospital room?: Total ?Help needed climbing 3-5 steps with a railing? : Total ?6 Click Score: 14 ? ?  ?End of Session Equipment Utilized During Treatment:  (Prosthesis) ?Activity Tolerance: Patient tolerated treatment well ?Patient left: in bed;with call bell/phone within reach;with bed alarm set ?Nurse Communication: Mobility status;Other (comment) (DME needs) ?PT Visit Diagnosis: Unsteadiness on feet  (R26.81);Other abnormalities of gait and mobility (R26.89);Repeated falls (R29.6) ?  ? ? ?Time: 8832-5498 ?PT Time Calculation (min) (ACUTE ONLY): 28 min ? ?Charges:  $Therapeutic Activity: 23-37 mins          ?          ? ?Lita Flynn P., PTA ?Acute Rehabilitation Services ?Secure Chat Preferred 9a-5:30pm ?Office: [phone]  ? ? ?Kara Pacer Justyn Langham ?10/16/2021, 2:09 PM ? ?

## 2021-10-16 NOTE — Progress Notes (Signed)
Mobility Specialist Progress Note ? ? 10/16/21 1430  ?Mobility  ?Activity Transferred from bed to chair  ?Level of Assistance Minimal assist, patient does 75% or more  ?Assistive Device Sliding board  ?RLE Weight Bearing NWB  ?Activity Response Tolerated well  ?$Mobility charge 1 Mobility  ? ?RN requesting assistance to get pt in d/c chair. Pt able to abide by WB precaution while using prosthetic and laterally scooted self into chair w/ minG. Left in chair in prep for d/c home.  ? ?Holland Falling ?Mobility Specialist ?Phone Number [phone] ? ?

## 2021-10-16 NOTE — Progress Notes (Addendum)
?  Progress Note ? ? ? ?10/16/2021 ?7:43 AM ?1 Day Post-Op ? ?Subjective:  no complaints ? ? ?Vitals:  ? 10/15/21 1942 10/16/21 0239  ?BP:  (!) 141/90  ?Pulse:  77  ?Resp: 20 20  ?Temp:  97.8 ?F (36.6 ?C)  ?SpO2:  100%  ? ?Physical Exam: ?Lungs:  non labored ?Incisions:  L groin cath site without hematoma ?Abdomen:  soft, ND, NT ?Neurologic: A&O ? ?CBC ?   ?Component Value Date/Time  ? WBC 12.7 (H) 10/14/2021 1138  ? RBC 3.96 (L) 10/14/2021 1138  ? HGB 12.9 (L) 10/14/2021 1138  ? HCT 40.6 10/14/2021 1138  ? PLT 250 10/14/2021 1138  ? MCV 102.5 (H) 10/14/2021 1138  ? MCH 32.6 10/14/2021 1138  ? MCHC 31.8 10/14/2021 1138  ? RDW 16.5 (H) 10/14/2021 1138  ? LYMPHSABS 0.8 10/14/2021 1138  ? MONOABS 0.7 10/14/2021 1138  ? EOSABS 0.1 10/14/2021 1138  ? BASOSABS 0.1 10/14/2021 1138  ? ? ?BMET ?   ?Component Value Date/Time  ? NA 130 (L) 10/16/2021 0242  ? K 4.1 10/16/2021 0242  ? CL 91 (L) 10/16/2021 0242  ? CO2 26 10/16/2021 0242  ? GLUCOSE 153 (H) 10/16/2021 0242  ? BUN 47 (H) 10/16/2021 0242  ? CREATININE 6.57 (H) 10/16/2021 0242  ? CALCIUM 9.4 10/16/2021 0242  ? GFRNONAA 10 (L) 10/16/2021 0242  ? GFRAA 8 (L) 11/10/2019 1200  ? ? ?INR ?   ?Component Value Date/Time  ? INR 1.6 (H) 04/13/2019 0520  ? ? ? ?Intake/Output Summary (Last 24 hours) at 10/16/2021 0743 ?Last data filed at 10/16/2021 1025 ?Gross per 24 hour  ?Intake 531.71 ml  ?Output 3000 ml  ?Net -2468.29 ml  ? ? ? ?Assessment/Plan:  51 y.o. male is s/p R SFA, ATA, TP trunk angioplasty 1 Day Post-Op  ? ?Patient is optimized from a vascular surgery standpoint ?L groin cath site is without hematoma ?Management of R foot wounds per Dr. Sharol Given ?He will need a prescription for plavix at discharge ?Vascular will sign off.  Office will arrange follow up in 4-6 weeks ? ? ? ?Dagoberto Ligas, PA-C ?[facility] ?[phone] ?10/16/2021 ?7:43 AM ? ? ?I agree with the above. I have seen and examined the pateint.  Blood flow to right leg has been optimized.  Wound  care per Dr. Sharol Given.  Will sign off.  Patient to follow up as an outpatient ? ?[place] Ruchama Kubicek ?

## 2021-10-16 NOTE — Progress Notes (Signed)
Occupational Therapy Treatment ?Patient Details ?Name: Michael Riggs ?MRN: [id number] ?DOB: 05/02/1971 ?Today's Date: 10/16/2021 ? ? ?History of present illness Pt is a 51 y/o M presenting to ED for facial and L knee pain after fall/near syncopal episode. s/p R achilles debridement and tissue graft with application of wound VAC on 10/10/2021. R SFA, ATA, TP trunk angiPMH includes L BKA, ESRD on HD, HTN, DM2, CVA and anemia of chronic disease. ?  ?OT comments ? Focus of session on educating pt in compensatory strategies leaning side to side. Discouraged pt from attempting any stand or squat pivot, opting for sliding board. Educated pt in use of drop arm commode. Deferred pt's request for RW to PT.   ? ?Recommendations for follow up therapy are one component of a multi-disciplinary discharge planning process, led by the attending physician.  Recommendations may be updated based on patient status, additional functional criteria and insurance authorization. ?   ?Follow Up Recommendations ? Home health OT (pt is refusing)  ?  ?Assistance Recommended at Discharge Intermittent Supervision/Assistance  ?Patient can return home with the following ? A little help with walking and/or transfers;A lot of help with bathing/dressing/bathroom ?  ?Equipment Recommendations ?  (drop arm BSC, transfer board)  ?  ?Recommendations for Other Services   ? ?  ?Precautions / Restrictions Precautions ?Precautions: Fall ?Precaution Comments: RLE NWB and pt noncompliant during pivot transfers; L BKA at baseline, impulsivity ?Required Braces or Orthoses: Other Brace ?Other Brace: Podus boot (light gray PRAFO without kickstand) ?Restrictions ?Weight Bearing Restrictions: Yes ?RLE Weight Bearing: Non weight bearing  ? ? ?  ? ?Mobility Bed Mobility ?  ?  ?  ?  ?  ?  ?  ?General bed mobility comments: seated EOB on arrival ?  ? ?Transfers ?Overall transfer level: Needs assistance ?Equipment used: None ?  ?  ?  ?  ?  ?  ? Lateral/Scoot Transfers:  Supervision ?General transfer comment: discouraged any attempt to pivot with pt unable to maintain NWB on R ?  ?  ?Balance Overall balance assessment: Needs assistance ?  ?Sitting balance-Leahy Scale: Good ?  ?  ?  ?  ?  ?  ?  ?  ?  ?  ?  ?  ?  ?  ?  ?  ?   ? ?ADL either performed or assessed with clinical judgement  ? ?ADL Overall ADL's : Needs assistance/impaired ?  ?  ?Grooming: Set up;Sitting;Wash/dry hands;Wash/dry face ?  ?  ?  ?  ?Lower Body Bathing Details (indicate cue type and reason): pt states his brother will use a device to pick him up and place him on his tub seat, pt aware his R LE should not get wet ?Upper Body Dressing : Set up;Sitting ?  ?  ?Lower Body Dressing Details (indicate cue type and reason): can don prosthesis with set up, instructed to lean side to side when donning pants ?  ?  ?  ?  ?  ?  ?  ?  ?  ? ?Extremity/Trunk Assessment   ?  ?  ?  ?  ?  ? ?Vision   ?  ?  ?Perception   ?  ?Praxis   ?  ? ?Cognition Arousal/Alertness: Awake/alert ?Behavior During Therapy: Impulsive ?Overall Cognitive Status: History of cognitive impairments - at baseline ?  ?  ?  ?  ?  ?  ?  ?  ?  ?  ?  ?  ?  ?  ?  ?  ?  General Comments: decreased awareness of deficits, safety and WB precaution generalization ?  ?  ?   ?Exercises   ? ?  ?Shoulder Instructions   ? ? ?  ?General Comments noted multiple small wounds on RLE (chronic), pt with bandaid on R hand in two areas, pt denies pain and does endorse LBP during HD sessions, recommend pressure relief geomat cushion, secretary notified to order.  ? ? ?Pertinent Vitals/ Pain       Pain Assessment ?Pain Assessment: No/denies pain ? ?Home Living   ?  ?  ?  ?  ?  ?  ?  ?  ?  ?  ?  ?  ?  ?  ?  ?  ?  ?  ? ?  ?Prior Functioning/Environment    ?  ?  ?  ?   ? ?Frequency ? Min 2X/week  ? ? ? ? ?  ?Progress Toward Goals ? ?OT Goals(current goals can now be found in the care plan section) ? Progress towards OT goals: Progressing toward goals ? ?Acute Rehab OT Goals ?OT Goal  Formulation: With patient ?Time For Goal Achievement: 10/21/21 ?Potential to Achieve Goals: Good  ?Plan Discharge plan remains appropriate   ? ?Co-evaluation ? ? ?   ?  ?  ?  ?  ? ?  ?AM-PAC OT "6 Clicks" Daily Activity     ?Outcome Measure ? ? Help from another person eating meals?: None ?Help from another person taking care of personal grooming?: A Little ?Help from another person toileting, which includes using toliet, bedpan, or urinal?: A Little ?Help from another person bathing (including washing, rinsing, drying)?: A Lot ?Help from another person to put on and taking off regular upper body clothing?: A Little ?Help from another person to put on and taking off regular lower body clothing?: A Lot ?6 Click Score: 17 ? ?  ?End of Session   ? ?OT Visit Diagnosis: Unsteadiness on feet (R26.81);Other abnormalities of gait and mobility (R26.89);Muscle weakness (generalized) (M62.81) ?  ?Activity Tolerance Patient tolerated treatment well ?  ?Patient Left in bed;with call bell/phone within reach;with bed alarm set ?  ?Nurse Communication   ?  ? ?   ? ?Time: 6203-5597 ?OT Time Calculation (min): 20 min ? ?Charges: OT General Charges ?$OT Visit: 1 Visit ?OT Treatments ?$Self Care/Home Management : 8-22 mins ? ?Nestor Lewandowsky, OTR/L ?Acute Rehabilitation Services ?Pager: [phone] ?Office: [phone]  ? ?[name] ?10/16/2021, 3:05 PM ?

## 2021-10-16 NOTE — Progress Notes (Signed)
?   ? ?[facility] ? ?Date of Admission:  10/06/2021    ?       ?Reason for visit: Follow up on right lower extremity wound ? ?Current antibiotics: ?Cefepime 3/20-present ?Vancomycin 3/20; 3/27-present ?  ? ?ASSESSMENT:   ? ?51 y.o. male admitted with: ? 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  Cultures from soft tissue were polymicrobial with Morganella, Serratia, and corynebacterium (possibly striatum).  MRI was negative for osteomyelitis, however, operative note indicates partial debridement of calcaneus was performed. ?PAD: s/p previous BKA and presenting with chronic right lower extremity wound as noted above.  Underwent revascularization yesterday with VVS to maximize blood flow. ?ESRD: On HD ?DM: A1c is 6.2. ? ? ?RECOMMENDATIONS:   ? ?Continue vanc and cefepime with HD ?Plan for 6 weeks of therapy given concern for bone involvement and patient desire to maximize attempt at limb salvage ?Wound care, glycemic control  ?End date for abx = 11/21/21 ?Will sign off, please call as needed ? ? ?Principal Problem: ?  Nonhealing wound of heel ?Active Problems: ?  ESRD (end stage renal disease) ([place]) ?  Cerebral thrombosis with cerebral infarction ?  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  PVD (peripheral vascular disease) ([place]) ?  Cellulitis of right lower leg ? ? ? ?MEDICATIONS:   ? ?Scheduled Meds: ? (feeding supplement) PROSource Plus  30 mL Oral BID BM  ? vitamin C  250 mg Oral BID  ? aspirin EC  81 mg Oral Daily  ? atorvastatin  40 mg Oral QHS  ? buPROPion  300 mg Oral Daily  ? calcitRIOL  2 mcg Oral Q M,W,F-HD  ? Chlorhexidine Gluconate Cloth  6 each Topical Q0600  ? clopidogrel  75 mg Oral Q breakfast  ? docusate sodium  100 mg Oral BID  ? feeding supplement (NEPRO CARB STEADY)  237 mL Oral Q24H  ? ferric citrate  630 mg Oral TID WC  ? heparin injection (subcutaneous)  5,000 Units  Subcutaneous Q8H  ? metoprolol tartrate  25 mg Oral BID  ? midodrine  10 mg Oral Q M,W,F-HD  ? multivitamin  1 tablet Oral QHS  ? pantoprazole  40 mg Oral QHS  ? polyethylene glycol  17 g Oral Daily  ? ramelteon  8 mg Oral QHS  ? sodium chloride flush  3 mL Intravenous Q12H  ? tamsulosin  0.4 mg Oral QHS  ? zinc sulfate  220 mg Oral Daily  ? ?Continuous Infusions: ? sodium chloride    ? sodium chloride    ? ceFEPime (MAXIPIME) IV 2 g (10/15/21 1820)  ? vancomycin 1,000 mg (10/15/21 1103)  ? ?PRN Meds:.sodium chloride, acetaminophen, albuterol, diphenhydrAMINE, hydrALAZINE, labetalol, methocarbamol **OR** [DISCONTINUED] methocarbamol (ROBAXIN) IV, ondansetron (ZOFRAN) IV, oxyCODONE, sodium chloride flush ? ?SUBJECTIVE:  ? ?24 hour events:  ?S/p revascularization ? ?No new issues noted ?Complains of left elbow pain due to pain meds being decreased ?No fevers, chills ?Tolerated VVS procedure ? ?Review of Systems  ?All other systems reviewed and are negative. ? ?  ?OBJECTIVE:  ? ?Blood pressure (!) 147/90, pulse 77, temperature 98.4 ?F (36.9 ?C), temperature source Oral, resp. rate 20, height 5\' 10"  (1.778 m), weight 94 kg, SpO2 99 %. ?Body mass index is 29.73 kg/m?. ? ?Physical Exam ?Constitutional:   ?   Appearance: Normal appearance. He is obese.  ?HENT:  ?   Head: Normocephalic and atraumatic.  ?  Pulmonary:  ?   Effort: Pulmonary effort is normal. No respiratory distress.  ?Musculoskeletal:  ?   Comments: Left elbow with full ROM ?Left BKA ?Right leg with wound vac in place.  Wrapped in bandage.   ?Skin: ?   General: Skin is warm and dry.  ?Neurological:  ?   General: No focal deficit present.  ?   Mental Status: He is alert and oriented to person, place, and time.  ?Psychiatric:     ?   Mood and Affect: Mood normal.     ?   Behavior: Behavior normal.  ? ? ? ?Lab Results: ?Lab Results  ?Component Value Date  ? WBC 12.7 (H) 10/14/2021  ? HGB 12.9 (L) 10/14/2021  ? HCT 40.6 10/14/2021  ? MCV 102.5 (H) 10/14/2021  ? PLT  250 10/14/2021  ?  ?Lab Results  ?Component Value Date  ? NA 130 (L) 10/16/2021  ? K 4.1 10/16/2021  ? CO2 26 10/16/2021  ? GLUCOSE 153 (H) 10/16/2021  ? BUN 47 (H) 10/16/2021  ? CREATININE 6.57 (H) 10/16/2021  ? CALCIUM 9.4 10/16/2021  ? GFRNONAA 10 (L) 10/16/2021  ? GFRAA 8 (L) 11/10/2019  ?  ?Lab Results  ?Component Value Date  ? ALT 15 10/06/2021  ? AST 19 10/06/2021  ? ALKPHOS 92 10/06/2021  ? BILITOT 1.5 (H) 10/06/2021  ? ? ?   ?Component Value Date/Time  ? CRP 19.4 (H) 03/28/2019 2028  ? ? ?   ?Component Value Date/Time  ? ESRSEDRATE >140 (H) 03/28/2019 2028  ? ?  ?I have reviewed the micro and lab results in Epic. ? ?Imaging: ?DG Chest 2 View ? ?Result Date: 10/14/2021 ?CLINICAL DATA:  Unresponsive episode. EXAM: CHEST - 2 VIEW COMPARISON:  Chest radiograph 10/06/2021 FINDINGS: The cardiac silhouette remains mildly enlarged. Density in the left lower hemithorax is similar to the prior study and reflects a combination of a small to moderate pleural effusion and lung consolidation or atelectasis. The right lung remains grossly clear. No pneumothorax is identified. No acute osseous abnormality is seen. IMPRESSION: Persistent left pleural effusion with left basilar atelectasis or consolidation. Electronically Signed   By: Logan Bores M.D.   On: 10/14/2021 13:48  ? ?PERIPHERAL VASCULAR CATHETERIZATION ? ?Result Date: 10/15/2021 ?Images from the original result were not included. Patient name: Michael Riggs MRN: [id number] DOB: 1971/03/30 Sex: male 10/15/2021 Pre-operative Diagnosis: Right lower extremity Rutherford 5 critical limb ischemia Post-operative diagnosis:  Same Surgeon:  Broadus John, MD Procedure Performed: 1.  Ultrasound-guided micropuncture access of the left common femoral artery 2.  Aortogram 3.  Second-order cannulation, right lower extremity angiogram 4.  Superficial femoral artery drug-coated balloon angioplasty using a 5 x 150 mm balloon 5.  Anterior tibial artery, tibioperoneal trunk  drug-coated balloon angioplasty using a 3 x 60 mm balloon 6.  Access managed with Pro-glide 7.  No sedation 8.  Contrast 126 Indications:  Patient is a 51 year old male with right lower extremity Rutherford 5 critical limb ischemia.  The wounds on his right leg have failed to heal.  Imaging demonstrates concern for peripheral arterial disease.  After discussing the risk and benefits of right lower extremity angiography in an effort to define and improve distal perfusion for wound healing, Lamarius elected to proceed. Findings: Aortogram: Bilateral renal arteries patent, no inflow disease appreciated On the right: Common femoral artery patent, normal profunda, superficial femoral artery with multiple tandem lesions with an area of flow-limiting stenosis - roughly 60%, at the distal  aspect in Hunter's canal.  Popliteal artery without flow-limiting disease.  50% narrowing at the ostium of the anterior tibial artery, 70% narrowing at the proximal tibioperoneal trunk.  Three-vessel runoff to the foot with intact pedal arch.  Small vessel disease appreciated in the foot  Procedure:  The patient was identified in the holding area and taken to room 8.  The patient was then placed supine on the table and prepped and draped in the usual sterile fashion.  A time out was called.  Ultrasound was used to evaluate the left common femoral artery.  It was patent .  A digital ultrasound image was acquired.  A micropuncture needle was used to access the left common femoral artery under ultrasound guidance.  An 018 wire was advanced without resistance and a micropuncture sheath was placed.  The 018 wire was removed and a benson wire was placed.  The micropuncture sheath was exchanged for a 5 french sheath.  An omniflush catheter was advanced over the wire to the level of L-1.  An abdominal angiogram was obtained.  Next, using the omniflush catheter and a benson wire, the aortic bifurcation was crossed and the catheter was placed into  theright external iliac artery and right runoff was obtained.  I like to intervene on the distal superficial femoral artery, proximal anterior tibial artery, tibioperoneal trunk. A 6 x 65 cm sheath was brought t

## 2021-10-17 ENCOUNTER — Emergency Department (HOSPITAL_COMMUNITY)
Admission: EM | Admit: 2021-10-17 | Discharge: 2021-10-17 | Disposition: A | Discharge status: Home / Self Care | Payer: Medicare Other | Attending: Emergency Medicine | Admitting: Emergency Medicine

## 2021-10-17 ENCOUNTER — Encounter (HOSPITAL_COMMUNITY): Payer: Self-pay | Admitting: Emergency Medicine

## 2021-10-17 ENCOUNTER — Emergency Department (HOSPITAL_COMMUNITY): Payer: Medicare Other

## 2021-10-17 DIAGNOSIS — Z992 Dependence on renal dialysis: Secondary | ICD-10-CM | POA: Insufficient documentation

## 2021-10-17 DIAGNOSIS — Z7902 Long term (current) use of antithrombotics/antiplatelets: Secondary | ICD-10-CM | POA: Insufficient documentation

## 2021-10-17 DIAGNOSIS — I120 Hypertensive chronic kidney disease with stage 5 chronic kidney disease or end stage renal disease: Secondary | ICD-10-CM | POA: Diagnosis not present

## 2021-10-17 DIAGNOSIS — E1122 Type 2 diabetes mellitus with diabetic chronic kidney disease: Secondary | ICD-10-CM | POA: Insufficient documentation

## 2021-10-17 DIAGNOSIS — Z7982 Long term (current) use of aspirin: Secondary | ICD-10-CM | POA: Insufficient documentation

## 2021-10-17 DIAGNOSIS — R4182 Altered mental status, unspecified: Secondary | ICD-10-CM | POA: Diagnosis present

## 2021-10-17 DIAGNOSIS — R410 Disorientation, unspecified: Secondary | ICD-10-CM | POA: Insufficient documentation

## 2021-10-17 DIAGNOSIS — Z79899 Other long term (current) drug therapy: Secondary | ICD-10-CM | POA: Diagnosis not present

## 2021-10-17 DIAGNOSIS — D72829 Elevated white blood cell count, unspecified: Secondary | ICD-10-CM | POA: Insufficient documentation

## 2021-10-17 DIAGNOSIS — N186 End stage renal disease: Secondary | ICD-10-CM | POA: Diagnosis not present

## 2021-10-17 DIAGNOSIS — Z89512 Acquired absence of left leg below knee: Secondary | ICD-10-CM | POA: Diagnosis not present

## 2021-10-17 LAB — COMPREHENSIVE METABOLIC PANEL
ALT: 16 U/L (ref 0–44)
AST: 25 U/L (ref 15–41)
Albumin: 3.1 g/dL — ABNORMAL LOW (ref 3.5–5.0)
Alkaline Phosphatase: 109 U/L (ref 38–126)
Anion gap: 14 (ref 5–15)
BUN: 49 mg/dL — ABNORMAL HIGH (ref 6–20)
CO2: 27 mmol/L (ref 22–32)
Calcium: 9 mg/dL (ref 8.9–10.3)
Chloride: 92 mmol/L — ABNORMAL LOW (ref 98–111)
Creatinine, Ser: 6.95 mg/dL — ABNORMAL HIGH (ref 0.61–1.24)
GFR, Estimated: 9 mL/min — ABNORMAL LOW (ref >60)
Glucose, Bld: 124 mg/dL — ABNORMAL HIGH (ref 70–99)
Potassium: 4.2 mmol/L (ref 3.5–5.1)
Sodium: 133 mmol/L — ABNORMAL LOW (ref 135–145)
Total Bilirubin: 1 mg/dL (ref 0.3–1.2)
Total Protein: 6.9 g/dL (ref 6.5–8.1)

## 2021-10-17 LAB — CBC WITH DIFFERENTIAL/PLATELET
Abs Immature Granulocytes: 0.57 10*3/uL — ABNORMAL HIGH (ref 0.00–0.07)
Basophils Absolute: 0.1 10*3/uL (ref 0.0–0.1)
Basophils Relative: 1 %
Eosinophils Absolute: 0.2 10*3/uL (ref 0.0–0.5)
Eosinophils Relative: 2 %
HCT: 38.8 % — ABNORMAL LOW (ref 39.0–52.0)
Hemoglobin: 12.5 g/dL — ABNORMAL LOW (ref 13.0–17.0)
Immature Granulocytes: 5 %
Lymphocytes Relative: 9 %
Lymphs Abs: 0.9 10*3/uL (ref 0.7–4.0)
MCH: 33.2 pg (ref 26.0–34.0)
MCHC: 32.2 g/dL (ref 30.0–36.0)
MCV: 102.9 fL — ABNORMAL HIGH (ref 80.0–100.0)
Monocytes Absolute: 1 10*3/uL (ref 0.1–1.0)
Monocytes Relative: 9 %
Neutro Abs: 8.1 10*3/uL — ABNORMAL HIGH (ref 1.7–7.7)
Neutrophils Relative %: 74 %
Platelets: 245 10*3/uL (ref 150–400)
RBC: 3.77 MIL/uL — ABNORMAL LOW (ref 4.22–5.81)
RDW: 17.2 % — ABNORMAL HIGH (ref 11.5–15.5)
WBC: 10.9 10*3/uL — ABNORMAL HIGH (ref 4.0–10.5)
nRBC: 0 % (ref 0.0–0.2)

## 2021-10-17 IMAGING — CR DG CHEST 2V
2 series · 2 of 2 positions shown · non-contrast
Comparison: [DATE].

CLINICAL DATA: sob, missed dialysis

EXAM:
CHEST - 2 VIEW

[chest ap]
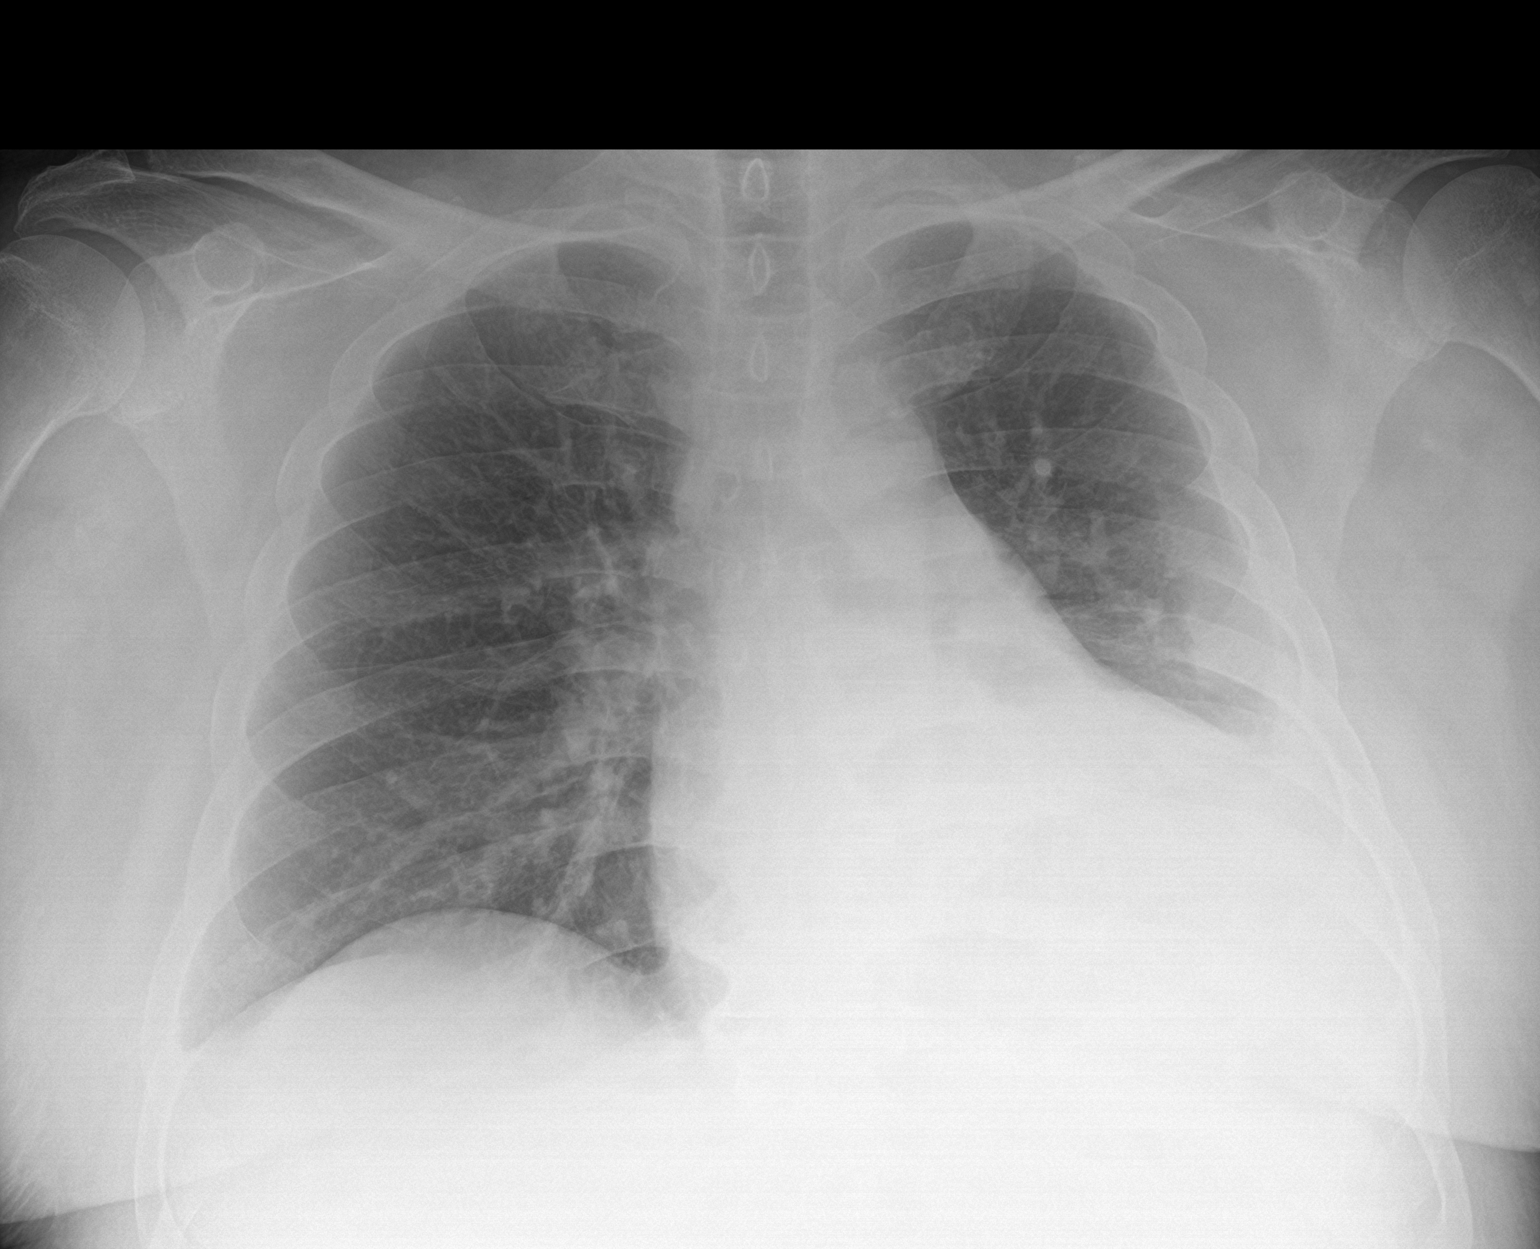

[chest lat]
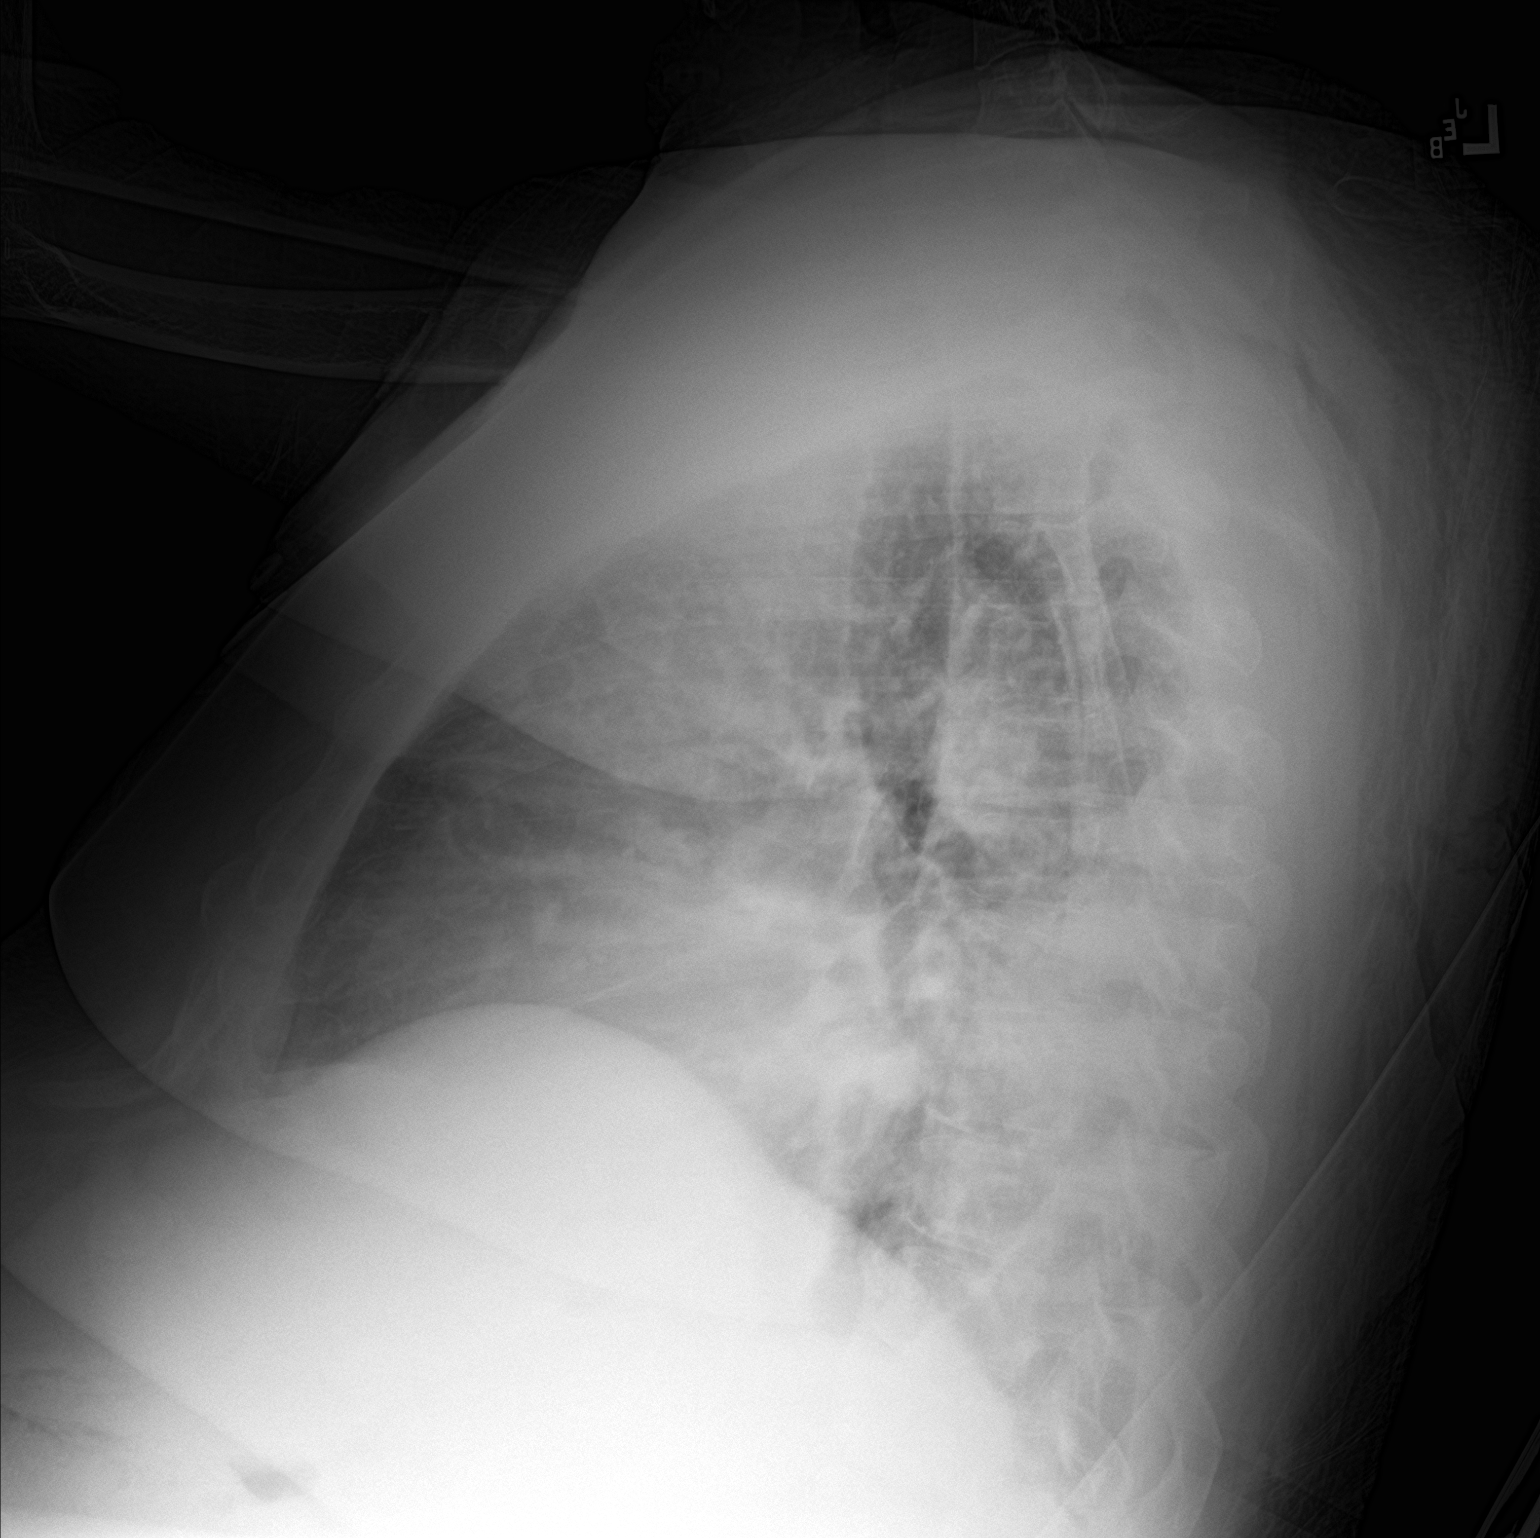

[2 of 2 positions shown; findings below may reference images not displayed]

FINDINGS: Persistent left pleural effusion with overlying left basilar
atelectasis and/or consolidation. Clear right lung. No visible
pneumothorax. Enlarged cardiac silhouette, similar. No evidence of
acute osseous abnormality.
IMPRESSION: 1. Persistent left pleural effusion with overlying left basilar
atelectasis and/or consolidation.
2. Cardiomegaly.

## 2021-10-17 NOTE — Discharge Instructions (Addendum)
Your work-up today was reassuring.  He did not exhibit any episodes similar to what dialysis center had sent you to the emergency room for.  You are at baseline without any confusion.  You are appropriate for discharge.  If you have any worsening symptoms you can return to the emergency room. ?

## 2021-10-17 NOTE — ED Triage Notes (Signed)
Patient [facility] for altered mental status from dialysis center, patient was supposed to have dialysis today but facility states he was too altered an uncooperative to complete treatment. Recently discharged with plan to receive antibiotics for infected leg wound. Patient is alert and in no apparent distress at this time. ?

## 2021-10-17 NOTE — ED Provider Triage Note (Signed)
Emergency Medicine Provider Triage Evaluation Note ? ?Michael Riggs , a 51 y.o. male  was evaluated in triage.  Pt sent here from dialysis due to altered mental status.  On examination, the patient is alert and oriented x4, completely cooperative and reasonable.  Patient states that he was at dialysis and requested that they stop the machine because his hand was hurting, his left hand, patient stated that they refused to do so and stated that he would have to go to the hospital because his hand was hurting.  Patient states he did not get a full session.  Patient states he was here yesterday due to leg infection. ? ?Review of Systems  ?Positive: Shortness of breath, left hand pain, back pain ?Negative: Chest pain, lightheadedness, dizziness, weakness, left hand pain ? ?Physical Exam  ?BP 130/86 (BP Location: Right Arm)   Pulse 72   Temp 97.8 ?F (36.6 ?C) (Oral)   Resp 18   SpO2 99%  ?Gen:   Awake, no distress   ?Resp:  Normal effort  ?MSK:   Moves extremities without difficulty  ?Other:  Lungs CTA B.  Left hand shows no overlying skin changes distress cellulitis.  2+ radial pulse noted.  Less than 2-second capillary refill. ? ?Medical Decision Making  ?Medically screening exam initiated at 12:24 PM.  Appropriate orders placed.  Michael Riggs was informed that the remainder of the evaluation will be completed by another provider, this initial triage assessment does not replace that evaluation, and the importance of remaining in the ED until their evaluation is complete. ? ? ?  ?Azucena Cecil, PA-C ?10/17/21 1226 ? ?

## 2021-10-17 NOTE — ED Provider Notes (Signed)
?Reading ?Provider Note ? ? ?CSN: [id number] ?Arrival date & time: 10/17/21  1209 ? ?  ? ?History ? ?Chief Complaint  ?Patient presents with  ? Altered Mental Status  ? ? ?Michael Riggs is a 51 y.o. male. ? ?51 year old male with past medical history of ESRD on HD, hypertension, left BKA, hyperlipidemia, type 2 diabetes, CVA, on IV antibiotics for right lower extremity wound presents today for evaluation of altered mental status from dialysis center.  Patient was just discharged yesterday and presented to dialysis center for his usual session when he was found to be altered and referred to the emergency room.  On exam patient is alert and oriented x3.  Denies pain, fever, chills, productive cough.  ? ?The history is provided by the patient. No language interpreter was used.  ? ?  ? ?Home Medications ?Prior to Admission medications   ?Medication Sig Start Date End Date Taking? Authorizing Provider  ?acetaminophen (TYLENOL) 500 MG tablet Take 1,000 mg by mouth every 6 (six) hours as needed for mild pain.    [provider]  ?albuterol (VENTOLIN HFA) 108 (90 Base) MCG/ACT inhaler Inhale 2 puffs into the lungs every 6 (six) hours as needed for wheezing or shortness of breath.    [provider]  ?amLODipine (NORVASC) 5 MG tablet Take 5 mg by mouth at bedtime.     [provider]  ?aspirin 81 MG EC tablet Take 1 tablet (81 mg total) by mouth daily. Swallow whole. 10/17/21   Wells Guiles, DO  ?atorvastatin (LIPITOR) 40 MG tablet Take 1 tablet (40 mg total) by mouth daily. ?Patient taking differently: Take 40 mg by mouth at bedtime. 03/13/19   Angiulli, Lavon Paganini, PA-C  ?augmented betamethasone dipropionate (DIPROLENE-AF) 0.05 % cream Apply 1 application. topically daily. 09/18/21   [provider]  ?buPROPion (WELLBUTRIN XL) 300 MG 24 hr tablet Take 1 tablet (300 mg total) by mouth in the morning. 10/16/21   Wells Guiles, DO  ?calcitRIOL  (ROCALTROL) 0.5 MCG capsule Take 4 capsules (2 mcg total) by mouth every Monday, Wednesday, and Friday with hemodialysis. 10/17/21   Wells Guiles, DO  ?ceFEPIme 2 g in sodium chloride 0.9 % 100 mL Inject 2 g into the vein every Monday, Wednesday, and Friday at 6 PM. 10/17/21 11/21/21  Wells Guiles, DO  ?clopidogrel (PLAVIX) 75 MG tablet Take 1 tablet (75 mg total) by mouth daily with breakfast. 10/16/21   Dagoberto Ligas, PA-C  ?ferric citrate (AURYXIA) 1 GM 210 MG(Fe) tablet Take 630 mg by mouth 3 (three) times daily with meals. 03/12/21   [provider]  ?ipratropium-albuterol (DUONEB) 0.5-2.5 (3) MG/3ML SOLN Take 3 mLs by nebulization every 6 (six) hours as needed (For shortness of breath).    [provider]  ?Melatonin 10 MG TABS Take 10 mg by mouth at bedtime.    [provider]  ?metoprolol tartrate (LOPRESSOR) 25 MG tablet Take 1 tablet (25 mg total) by mouth 2 (two) times daily. 10/16/21   Wells Guiles, DO  ?midodrine (PROAMATINE) 10 MG tablet Take 10 mg by mouth See admin instructions. Takes 10mg  on days with dialysis 09/17/20   [provider]  ?ondansetron (ZOFRAN) 4 MG tablet Take 4 mg by mouth daily as needed for vomiting. 01/01/21   [provider]  ?oxycodone (OXY-IR) 5 MG capsule Take 1 capsule (5 mg total) by mouth every 6 (six) hours as needed. 07/06/19   Dagoberto Ligas, PA-C  ?pantoprazole (PROTONIX)  40 MG tablet Take 1 tablet (40 mg total) by mouth daily. ?Patient taking differently: Take 40 mg by mouth at bedtime. 03/13/19   Angiulli, Lavon Paganini, PA-C  ?sucroferric oxyhydroxide (VELPHORO) 500 MG chewable tablet Chew 1,000 mg by mouth 3 (three) times daily with meals.    [provider]  ?tamsulosin (FLOMAX) 0.4 MG CAPS capsule Take 1 capsule (0.4 mg total) by mouth daily after breakfast. ?Patient taking differently: Take 0.4 mg by mouth at bedtime. 03/13/19   Angiulli, Lavon Paganini, PA-C  ?vancomycin (VANCOCIN) 1-5 GM/200ML-% SOLN Inject 200 mLs  (1,000 mg total) into the vein every Monday, Wednesday, and Friday with hemodialysis. 10/17/21 11/21/21  Wells Guiles, DO  ?vitamin C (ASCORBIC ACID) 500 MG tablet Take 500 mg by mouth daily.    [provider]  ?Zinc Sulfate 220 (50 Zn) MG TABS Take 1 tablet (220 mg total) by mouth daily for 11 days. 10/17/21 10/28/21  Wells Guiles, DO  ?   ? ?Allergies    ?Tape   ? ?Review of Systems   ?Review of Systems  ?Constitutional:  Negative for activity change, chills and fever.  ?Respiratory:  Negative for shortness of breath.   ?Cardiovascular:  Negative for chest pain.  ?Gastrointestinal:  Negative for abdominal pain.  ?Neurological:  Negative for syncope, light-headedness and headaches.  ?All other systems reviewed and are negative. ? ?Physical Exam ?Updated Vital Signs ?BP 128/90 (BP Location: Right Arm)   Pulse 71   Temp 98.6 ?F (37 ?C) (Oral)   Resp 18   SpO2 95%  ?Physical Exam ?Vitals and nursing note reviewed.  ?Constitutional:   ?   General: He is not in acute distress. ?   Appearance: Normal appearance. He is not ill-appearing.  ?HENT:  ?   Head: Normocephalic and atraumatic.  ?   Nose: Nose normal.  ?Eyes:  ?   General: No scleral icterus. ?   Extraocular Movements: Extraocular movements intact.  ?   Conjunctiva/sclera: Conjunctivae normal.  ?Cardiovascular:  ?   Rate and Rhythm: Normal rate and regular rhythm.  ?   Pulses: Normal pulses.  ?   Heart sounds: Normal heart sounds.  ?Pulmonary:  ?   Effort: Pulmonary effort is normal. No respiratory distress.  ?   Breath sounds: Normal breath sounds. No wheezing or rales.  ?Abdominal:  ?   General: There is no distension.  ?   Tenderness: There is no abdominal tenderness.  ?Musculoskeletal:     ?   General: Normal range of motion.  ?   Cervical back: Normal range of motion.  ?   Comments: Left below-knee amputation.  Right knee with splint in place and wound VAC in place.   ?Skin: ?   General: Skin is warm and dry.  ?Neurological:  ?   General: No  focal deficit present.  ?   Mental Status: He is alert. Mental status is at baseline.  ? ? ?ED Results / Procedures / Treatments   ?Labs ?(all labs ordered are listed, but only abnormal results are displayed) ?Labs Reviewed  ?CBC WITH DIFFERENTIAL/PLATELET - Abnormal; Notable for the following components:  ?    Result Value  ? WBC 10.9 (*)   ? RBC 3.77 (*)   ? Hemoglobin 12.5 (*)   ? HCT 38.8 (*)   ? MCV 102.9 (*)   ? RDW 17.2 (*)   ? Neutro Abs 8.1 (*)   ? Abs Immature Granulocytes 0.57 (*)   ? All other components  within normal limits  ?COMPREHENSIVE METABOLIC PANEL - Abnormal; Notable for the following components:  ? Sodium 133 (*)   ? Chloride 92 (*)   ? Glucose, Bld 124 (*)   ? BUN 49 (*)   ? Creatinine, Ser 6.95 (*)   ? Albumin 3.1 (*)   ? GFR, Estimated 9 (*)   ? All other components within normal limits  ?URINALYSIS, ROUTINE W REFLEX MICROSCOPIC  ? ? ?EKG ?EKG Interpretation ? ?Date/Time:  Friday October 17 2021 12:30:41 EDT ?Ventricular Rate:  69 ?PR Interval:  180 ?QRS Duration: 84 ?QT Interval:  438 ?QTC Calculation: 469 ?R Axis:   101 ?Text Interpretation: Normal sinus rhythm Rightward axis ST & T wave abnormality, consider inferolateral ischemia Abnormal ECG When compared with ECG of 07-Oct-2021 22:16, PREVIOUS ECG IS PRESENT No significant change since last tracing Confirmed by Isla Pence [phone]) on 10/17/2021 3:14:21 PM ? ?Radiology ?DG Chest 2 View ? ?Result Date: 10/17/2021 ?CLINICAL DATA:  sob, missed dialysis EXAM: CHEST - 2 VIEW COMPARISON:  October 14, 2021. FINDINGS: Persistent left pleural effusion with overlying left basilar atelectasis and/or consolidation. Clear right lung. No visible pneumothorax. Enlarged cardiac silhouette, similar. No evidence of acute osseous abnormality. IMPRESSION: 1. Persistent left pleural effusion with overlying left basilar atelectasis and/or consolidation. 2. Cardiomegaly. Electronically Signed   By: Margaretha Sheffield M.D.   On: 10/17/2021 12:58    ? ?Procedures ?Procedures  ? ? ?Medications Ordered in ED ?Medications - No data to display ? ?ED Course/ Medical Decision Making/ A&P ?  ?                        ?Medical Decision Making ? ?Medical Decision Making / ED Course ?

## 2021-10-19 LAB — VITAMIN A: Vitamin A (Retinoic Acid): 76 ug/dL — ABNORMAL HIGH (ref 20.1–62.0)

## 2021-10-20 ENCOUNTER — Telehealth: Payer: Self-pay | Admitting: Orthopedic Surgery

## 2021-10-20 ENCOUNTER — Ambulatory Visit: Payer: Medicare Other | Admitting: Internal Medicine

## 2021-10-20 NOTE — Telephone Encounter (Signed)
Called pt 2x to sch per mychart message from [facility] "he was a surgery and was d/c  then went back to the ER and needs follow up with Dr. Sharol Given or Junie Panning today, tomorrow or Wednesday", will call back late morning/return from lunch ?

## 2021-10-20 NOTE — Progress Notes (Deleted)
?Cardiology Office Note:   ? ?Date:  10/20/2021  ? ?ID:  Michael Riggs, DOB 12-30-1970, MRN [id number] ? ?PCP:  [name], MD ?  ?[facility] Providers ?Cardiologist:  Janina Mayo, MD { ?Click to update primary MD,subspecialty MD or APP then REFRESH:1}   ? ?Referring MD: [name], MD  ? ?No chief complaint on file. ?*** ? ?History of Present Illness:   ? ?Michael Riggs is a 51 y.o. male with a hx of *** ? ?Past Medical History:  ?Diagnosis Date  ? Anemia   ? Anemia of chronic disease   ? Anxiety   ? ARF (acute renal failure) ([place]) 01/30/2019  ? Calciphylaxis   ? Controlled type 2 diabetes mellitus with hyperglycemia, without long-term current use of insulin ([place])   ? Debility 02/09/2019  ? Depression   ? Diabetes mellitus type 2 in obese ([place]) 07/04/2013  ? Diabetes mellitus with peripheral vascular disease ([place])   ? type 2 no meds, lost 100 lbs  ? Dyslipidemia   ? Dyspnea   ? inhaler  ? End stage renal disease ([place])   ? ESRD (end stage renal disease) on dialysis ([place]) 01/2019  ? MWFS  ? ESRD on dialysis [facility])   ? Essential hypertension   ? Fluid overload 02/04/2019  ? GERD (gastroesophageal reflux disease)   ? Goals of care, counseling/discussion   ? Habitual alcohol use 07/04/2013  ? Headache   ? Hyperkalemia 01/30/2019  ? Hypertension   ? Hypoglycemia 01/30/2019  ? Hypokalemia 01/30/2019  ? Labile blood pressure   ? Leukocytosis   ? Lower GI bleed 05/14/2019  ? Macrocytic anemia 01/30/2019  ? Obesity, Class II, BMI 35-39.9, with comorbidity 07/04/2013  ? Palliative care encounter   ? PDR (proliferative diabetic retinopathy) ([place]) 12/14/2013  ? Physical deconditioning   ? Pressure injury of skin 04/27/2019  ? Pruritus   ? Scrotal edema   ? Scrotal pain   ? Sleep apnea   ? uses CPAP  ? Sleep disturbance   ? Slow transit constipation   ? Status post below-knee amputation of left lower extremity ([place])   ? Stroke [facility])   ? mini -shown on CT scan  ? Syphilis 01/2019  ? Urinary retention   ? Venous  hypertension 09/22/2013  ? Wound infection 03/28/2019  ? ? ?Past Surgical History:  ?Procedure Laterality Date  ? ABDOMINAL AORTOGRAM W/LOWER EXTREMITY N/A 10/15/2021  ? Procedure: ABDOMINAL AORTOGRAM W/LOWER EXTREMITY;  Surgeon: Broadus John, MD;  Location: [facility];  Service: Cardiovascular;  Laterality: N/A;  ? AV FISTULA PLACEMENT Left 01/09/2019  ? Procedure: ARTERIOVENOUS (AV) FISTULA CREATION LEFT UPPER ARM;  Surgeon: [name], MD;  Location: [facility];  Service: Vascular;  Laterality: Left;  ? BASCILIC VEIN TRANSPOSITION Left 07/06/2019  ? Procedure: BASILIC VEIN TRANSPOSITION SECOND STAGE LEFT ARM;  Surgeon: [name], MD;  [facility];  Service: Vascular;  Laterality: Left;  ? below the knee amputation Left   ? EYE SURGERY Bilateral   ? FLEXIBLE SIGMOIDOSCOPY N/A 05/15/2019  ? Procedure: FLEXIBLE SIGMOIDOSCOPY;  Surgeon: Irving Copas., MD;  Location: [facility];  Service: Gastroenterology;  Laterality: N/A;  ? HEMOSTASIS CLIP PLACEMENT  05/15/2019  ? Procedure: HEMOSTASIS CLIP PLACEMENT;  Surgeon: Irving Copas., MD;  Location: [facility];  Service: Gastroenterology;;  ? HOT HEMOSTASIS N/A 05/15/2019  ? Procedure: HOT HEMOSTASIS (ARGON PLASMA COAGULATION/BICAP);  Surgeon: Rush Landmark Telford Nab., MD;  Location: [facility];  Service:  Gastroenterology;  Laterality: N/A;  ? I & D EXTREMITY Right 10/10/2021  ? Procedure: RIGHT ACHILLES DEBRIDEMENT AND TISSUE GRAFT;  Surgeon: [name], MD;  Location: [facility];  Service: Orthopedics;  Laterality: Right;  ? IR FLUORO GUIDE CV LINE RIGHT  01/31/2019  ? IR FLUORO GUIDE CV LINE RIGHT  02/03/2019  ? IR THORACENTESIS ASP PLEURAL SPACE W/IMG GUIDE  11/08/2019  ? IR US GUIDE VASC ACCESS RIGHT  01/31/2019  ? IR US GUIDE VASC ACCESS RIGHT  02/03/2019  ? PERIPHERAL VASCULAR BALLOON ANGIOPLASTY  10/15/2021  ? Procedure: PERIPHERAL VASCULAR BALLOON ANGIOPLASTY;  Surgeon: Broadus John, MD;  Location: [facility];  Service:  Cardiovascular;;  rt sfa and trifurcation  ? SCLEROTHERAPY  05/15/2019  ? Procedure: SCLEROTHERAPY;  Surgeon: Mansouraty, Telford Nab., MD;  Location: [facility];  Service: Gastroenterology;;  ? ? ?Current Medications: ?No outpatient medications have been marked as taking for the 10/20/21 encounter (Appointment) with Janina Mayo, MD.  ?  ? ?Allergies:   Tape  ? ?Social History  ? ?Socioeconomic History  ? Marital status: Divorced  ?  Spouse name: Not on file  ? Number of children: Not on file  ? Years of education: Not on file  ? Highest education level: Not on file  ?Occupational History  ? Not on file  ?Tobacco Use  ? Smoking status: Former  ?  Types: Cigarettes  ?  Start date: 11/18/2018  ?  Quit date: 01/18/2019  ?  Years since quitting: 2.7  ? Smokeless tobacco: Never  ? Tobacco comments:  ?  pt stated got bored with it  ?Vaping Use  ? Vaping Use: Never used  ?Substance and Sexual Activity  ? Alcohol use: Not Currently  ?  Comment: heavy drinker in the past, none since 07/19/18  ? Drug use: Not Currently  ? Sexual activity: Not Currently  ?Other Topics Concern  ? Not on file  ?Social History Narrative  ? Not on file  ? ?Social Determinants of Health  ? ?Financial Resource Strain: Not on file  ?Food Insecurity: Not on file  ?Transportation Needs: Not on file  ?Physical Activity: Not on file  ?Stress: Not on file  ?Social Connections: Not on file  ?  ? ?Family History: ?The patient's ***family history includes Diabetes in his mother; Multiple myeloma in his mother. ? ?ROS:   ?Please see the history of present illness.    ?*** All other systems reviewed and are negative. ? ?EKGs/Labs/Other Studies Reviewed:   ? ?The following studies were reviewed today: ?*** ? ?EKG:  EKG is *** ordered today.  The ekg ordered today demonstrates *** ? ?Recent Labs: ?12/05/2020: TSH 3.12 ?10/14/2021: Magnesium 2.0 ?10/17/2021: ALT 16; BUN 49; Creatinine, Ser 6.95; Hemoglobin 12.5; Platelets 245; Potassium 4.2; Sodium 133  ?Recent Lipid  Panel ?   ?Component Value Date/Time  ? CHOL 75 10/07/2021 0645  ? TRIG 82 10/07/2021 0645  ? HDL 26 (L) 10/07/2021 0645  ? CHOLHDL 2.9 10/07/2021 0645  ? VLDL 16 10/07/2021 0645  ? [place] 33 10/07/2021 0645  ? ? ? ?Risk Assessment/Calculations:   ?{Does this patient have ATRIAL FIBRILLATION?:[id number]} ? ?    ? ?Physical Exam:   ? ?VS:  There were no vitals taken for this visit.   ? ?Wt Readings from Last 3 Encounters:  ?10/15/21 207 lb 3.7 oz (94 kg)  ?12/05/20 224 lb 12.8 oz (102 kg)  ?11/27/20 211 lb 10.3 oz (96 kg)  ?  ? ?  GEN: *** Well nourished, well developed in no acute distress ?HEENT: Normal ?NECK: No JVD; No carotid bruits ?LYMPHATICS: No lymphadenopathy ?CARDIAC: ***RRR, no murmurs, rubs, gallops ?RESPIRATORY:  Clear to auscultation without rales, wheezing or rhonchi  ?ABDOMEN: Soft, non-tender, non-distended ?MUSCULOSKELETAL:  No edema; No deformity  ?SKIN: Warm and dry ?NEUROLOGIC:  Alert and oriented x 3 ?PSYCHIATRIC:  Normal affect  ? ?ASSESSMENT:   ? ?No diagnosis found. ?PLAN:   ? ?In order of problems listed above: ? ?*** ? ?   ? ?{Are you ordering a CV Procedure (e.g. stress test, cath, DCCV, TEE, etc)?   Press F2        :[id number]}  ? ? ?Medication Adjustments/Labs and Tests Ordered: ?Current medicines are reviewed at length with the patient today.  Concerns regarding medicines are outlined above.  ?No orders of the defined types were placed in this encounter. ? ?No orders of the defined types were placed in this encounter. ? ? ?There are no Patient Instructions on file for this visit.  ? ?Signed, ?Janina Mayo, MD  ?10/20/2021 8:22 AM    ?[place] ?

## 2021-10-22 LAB — VITAMIN C: Vitamin C: 0.5 mg/dL (ref 0.4–2.0)

## 2021-10-23 ENCOUNTER — Encounter: Payer: Self-pay | Admitting: Orthopedic Surgery

## 2021-10-23 ENCOUNTER — Ambulatory Visit (INDEPENDENT_AMBULATORY_CARE_PROVIDER_SITE_OTHER): Payer: Medicare Other | Admitting: Orthopedic Surgery

## 2021-10-23 DIAGNOSIS — L97314 Non-pressure chronic ulcer of right ankle with necrosis of bone: Secondary | ICD-10-CM

## 2021-10-23 NOTE — Progress Notes (Signed)
? ?Office Visit Note ?  ?Patient: Michael Riggs           ?Date of Birth: 03/23/1971           ?MRN: [id number] ?Visit Date: 10/23/2021 ?             ?Requested by: [name], MD ?[address] ?[place],  [place] [address] ?PCP: [name], MD ? ?Chief Complaint  ?Patient presents with  ? Right Achilles Tendon - Routine Post Op  ?  10/10/21 right achilles debridement and kerecis graft/powder  ? ? ? ? ?HPI: ?Patient is a 51 year old gentleman who is seen in follow-up status post skin graft to the necrotic right Achilles tendon ulcer. ? ?Assessment & Plan: ?Visit Diagnoses:  ?1. Ankle ulcer, right, with necrosis of bone ([place])   ? ? ?Plan: Start dry dressing changes daily the importance of floating the heel and using the PRAFO 24 hours a day was discussed.  Patient states he understands will follow-up next week. ? ?Follow-Up Instructions: Return in about 1 week (around 10/30/2021).  ? ?Ortho Exam ? ?Patient is alert, oriented, no adenopathy, well-dressed, normal affect, normal respiratory effort. ?Examination there is macerated soft tissue around the wound.  The skin graft is intact there is no cellulitis odor or drainage.  Patient also has blisters and ulcers with ischemic changes to the left below-knee amputation in the right leg. ? ?Imaging: ?No results found. ? ? ?Labs: ?Lab Results  ?Component Value Date  ? HGBA1C 6.2 (H) 10/08/2021  ? HGBA1C 5.3 05/16/2019  ? HGBA1C 5.0 01/31/2019  ? ESRSEDRATE >140 (H) 03/28/2019  ? CRP 19.4 (H) 03/28/2019  ? REPTSTATUS 10/15/2021 FINAL 10/10/2021  ? GRAMSTAIN  10/10/2021  ?  NO WBC SEEN ?FEW GRAM NEGATIVE RODS ?RARE GRAM POSITIVE COCCI ?  ? CULT  10/10/2021  ?  ABUNDANT MORGANELLA MORGANII ?ABUNDANT SERRATIA MARCESCENS ?ABUNDANT CORYNEBACTERIUM SPECIES ?Standardized susceptibility testing for this organism is not available. ?NO ANAEROBES ISOLATED ?Performed at [facility], [place] [address], [place], [place] [address] ?  ? LABORGA MORGANELLA MORGANII 10/10/2021   ? LABORGA SERRATIA MARCESCENS 10/10/2021  ? ? ? ?Lab Results  ?Component Value Date  ? ALBUMIN 3.1 (L) 10/17/2021  ? ALBUMIN 2.9 (L) 10/15/2021  ? ALBUMIN 2.8 (L) 10/13/2021  ? PREALBUMIN 9.1 (L) 03/28/2019  ? ? ?Lab Results  ?Component Value Date  ? MG 2.0 10/14/2021  ? MG 1.9 10/08/2021  ? MG 2.2 11/27/2020  ? ?Lab Results  ?Component Value Date  ? VD25OH 21.82 (L) 10/14/2021  ? ? ?Lab Results  ?Component Value Date  ? PREALBUMIN 9.1 (L) 03/28/2019  ? ? ?  Latest Ref Rng & Units 10/17/2021  ? 12:30 PM 10/14/2021  ? 11:38 AM 10/13/2021  ?  9:39 AM  ?CBC EXTENDED  ?WBC 4.0 - 10.5 K/uL 10.9   12.7   13.4    ?RBC 4.22 - 5.81 MIL/uL 3.77   3.96   3.75    ?Hemoglobin 13.0 - 17.0 g/dL 12.5   12.9   12.3    ?HCT 39.0 - 52.0 % 38.8   40.6   38.4    ?Platelets 150 - 400 K/uL 245   250   265    ?NEUT# 1.7 - 7.7 K/uL 8.1   9.8     ?Lymph# 0.7 - 4.0 K/uL 0.9   0.8     ? ? ? ?There is no height or weight on file to calculate BMI. ? ?  Orders:  ?No orders of the defined types were placed in this encounter. ? ?No orders of the defined types were placed in this encounter. ? ? ? Procedures: ?No procedures performed ? ?Clinical Data: ?No additional findings. ? ?ROS: ? ?All other systems negative, except as noted in the HPI. ?Review of Systems ? ?Objective: ?Vital Signs: There were no vitals taken for this visit. ? ?Specialty Comments:  ?No specialty comments available. ? ?PMFS History: ?Patient Active Problem List  ? Diagnosis Date Noted  ? Cellulitis of right lower leg   ? Cerebral thrombosis with cerebral infarction 10/08/2021  ? 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 PVD (peripheral vascular disease) ([place])   ? Nonhealing wound of heel 10/06/2021  ? 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 Personal history of COVID-19 09/23/2020  ? COVID-19 09/11/2020  ? Acute respiratory failure with hypoxia ([place]) 11/06/2019  ? CAP (community acquired pneumonia) 10/11/2019  ? Generalized hyperhidrosis 10/11/2019  ? Allergy,  unspecified, initial encounter 10/03/2019  ? Diarrhea, unspecified 07/24/2019  ? Lower GI bleed 05/14/2019  ? Pressure injury of skin 04/27/2019  ? Admission for palliative care 04/27/2019  ? Major depressive disorder, single episode, unspecified 04/27/2019  ? Other disorders of calcium metabolism 04/27/2019  ? Palliative care encounter   ? Goals of care, counseling/discussion   ? Calciphylaxis   ? Wound infection 03/28/2019  ? Hypertension   ? Dyslipidemia   ? Diabetes mellitus with peripheral vascular disease ([place])   ? Depression   ? Physical deconditioning   ? Coagulation defect, unspecified ([place]) 03/15/2019  ? Other malaise 03/14/2019  ? Other specified disorders of the male genital organs 03/14/2019  ? Other specified symptoms and signs involving the circulatory and respiratory systems 03/14/2019  ? Unspecified protein-calorie malnutrition ([place]) 03/14/2019  ? Pruritus   ? Scrotal pain   ? Labile blood pressure   ? Scrotal edema   ? Status post below-knee amputation of left lower extremity ([place])   ? Slow transit constipation   ? Leukocytosis   ? Sleep disturbance   ? Essential hypertension   ? Anemia of chronic disease   ? Controlled type 2 diabetes mellitus with hyperglycemia, without long-term current use of insulin ([place])   ? ESRD on dialysis [facility])   ? Urinary retention   ? Debility 02/09/2019  ? Fluid overload 02/04/2019  ? Dyspnea, unspecified 02/03/2019  ? Gastro-esophageal reflux disease without esophagitis 02/03/2019  ? Hyperlipidemia, unspecified 02/03/2019  ? Iron deficiency anemia, unspecified 02/03/2019  ? Major depressive disorder, recurrent, in partial remission ([place]) 02/03/2019  ? Other problems related to lifestyle 02/03/2019  ? Pain in right leg 02/03/2019  ? Pain, unspecified 02/03/2019  ? Psychophysiologic insomnia 02/03/2019  ? Secondary hyperparathyroidism of renal origin ([place]) 02/03/2019  ? Testicular hypofunction 02/03/2019  ? Hyperkalemia 01/30/2019  ? ARF (acute renal failure) ([place])  01/30/2019  ? Hypokalemia 01/30/2019  ? Hypoglycemia 01/30/2019  ? Syphilis 01/30/2019  ? Macrocytic anemia 01/30/2019  ? ESRD (end stage renal disease) ([place])   ? ESRD (end stage renal disease) on dialysis ([place]) 01/2019  ? PDR (proliferative diabetic retinopathy) ([place]) 12/14/2013  ? Venous hypertension 09/22/2013  ? Acquired absence of left leg below knee ([place]) 07/20/2013  ? Diabetes mellitus type 2 in obese ([place]) 07/04/2013  ? Habitual alcohol use 07/04/2013  ? Obesity, Class II, BMI 35-39.9, with comorbidity 07/04/2013  ? ?Past Medical History:  ?Diagnosis Date  ? Anemia   ? Anemia of chronic disease   ?  Anxiety   ? ARF (acute renal failure) ([place]) 01/30/2019  ? Calciphylaxis   ? Controlled type 2 diabetes mellitus with hyperglycemia, without long-term current use of insulin ([place])   ? Debility 02/09/2019  ? Depression   ? Diabetes mellitus type 2 in obese ([place]) 07/04/2013  ? Diabetes mellitus with peripheral vascular disease ([place])   ? type 2 no meds, lost 100 lbs  ? Dyslipidemia   ? Dyspnea   ? inhaler  ? End stage renal disease ([place])   ? ESRD (end stage renal disease) on dialysis ([place]) 01/2019  ? MWFS  ? ESRD on dialysis [facility])   ? Essential hypertension   ? Fluid overload 02/04/2019  ? GERD (gastroesophageal reflux disease)   ? Goals of care, counseling/discussion   ? Habitual alcohol use 07/04/2013  ? Headache   ? Hyperkalemia 01/30/2019  ? Hypertension   ? Hypoglycemia 01/30/2019  ? Hypokalemia 01/30/2019  ? Labile blood pressure   ? Leukocytosis   ? Lower GI bleed 05/14/2019  ? Macrocytic anemia 01/30/2019  ? Obesity, Class II, BMI 35-39.9, with comorbidity 07/04/2013  ? Palliative care encounter   ? PDR (proliferative diabetic retinopathy) ([place]) 12/14/2013  ? Physical deconditioning   ? Pressure injury of skin 04/27/2019  ? Pruritus   ? Scrotal edema   ? Scrotal pain   ? Sleep apnea   ? uses CPAP  ? Sleep disturbance   ? Slow transit constipation   ? Status post below-knee amputation of left lower extremity ([place])   ?  Stroke [facility])   ? mini -shown on CT scan  ? Syphilis 01/2019  ? Urinary retention   ? Venous hypertension 09/22/2013  ? Wound infection 03/28/2019  ?  ?Family History  ?Problem Relation Age of Onset  ? Diabetes Mother

## 2021-10-27 ENCOUNTER — Encounter: Payer: Self-pay | Admitting: Internal Medicine

## 2021-10-27 ENCOUNTER — Telehealth: Payer: Self-pay

## 2021-10-27 NOTE — Telephone Encounter (Signed)
Wound care called and stated pt showed up without appt wanting his dressing redone. Per wound care they wrapped it but asked we reach out to pt. I have lvm for pt to call back to have him come in to have it looked at today by Dr. Sharol Given.  ?

## 2021-10-27 NOTE — Telephone Encounter (Signed)
Called nad lm on vm for pt to ask if he would like to move his appt up sooner than this Thursday. He went to the wound center today and they changed dressing advised that we will monitor the achilles debridement and want to make sure that he is doing hsi daily dressing changes. Will hold this message and await return call.  ?

## 2021-10-28 ENCOUNTER — Other Ambulatory Visit: Payer: Self-pay | Admitting: Endocrinology

## 2021-10-29 NOTE — Telephone Encounter (Signed)
Pt has an appt tomorrow advised that he will need to come in later at 3:30 ?

## 2021-10-30 ENCOUNTER — Telehealth: Payer: Self-pay | Admitting: Orthopedic Surgery

## 2021-10-30 ENCOUNTER — Ambulatory Visit (INDEPENDENT_AMBULATORY_CARE_PROVIDER_SITE_OTHER): Payer: Medicare Other | Admitting: Orthopedic Surgery

## 2021-10-30 DIAGNOSIS — L97314 Non-pressure chronic ulcer of right ankle with necrosis of bone: Secondary | ICD-10-CM

## 2021-10-30 NOTE — Telephone Encounter (Signed)
Pt is stating that he needs notes from today fior wound care to be scheduled  ? ?Advised pt I would have a nurse call   ?

## 2021-11-04 ENCOUNTER — Encounter: Payer: Self-pay | Admitting: Orthopedic Surgery

## 2021-11-04 NOTE — Telephone Encounter (Signed)
Pending dictation from last office visit.  Pt is being treated at the wound care center is [place] and they need his last office visit note. Phone [phone] fax [phone] will fax over information once completed.  ?

## 2021-11-04 NOTE — Progress Notes (Signed)
? ?Office Visit Note ?  ?Patient: Michael Riggs           ?Date of Birth: Nov 12, 1970           ?MRN: [id number] ?Visit Date: 10/30/2021 ?             ?Requested by: [name], MD ?[facility] [address] ?[place],  [place] [address] ?PCP: [name], MD ? ?Chief Complaint  ?Patient presents with  ? Right Foot - Follow-up  ?  10/10/21 right achilles debridement and kerecis graft/powder  ? ? ? ? ?HPI: ?Patient is a 51 year old gentleman who is 3 weeks status post debridement right Achilles tendon application of Kerecis graft and powder. ? ?Assessment & Plan: ?Visit Diagnoses:  ?1. Ankle ulcer, right, with necrosis of bone ([place])   ? ? ?Plan: Patient would like to proceed with wound care treatment at the wound center 5 days a week.  Recommended Silvadene dressing changes daily. ? ?Follow-Up Instructions: Return in about 2 weeks (around 11/13/2021).  ? ?Ortho Exam ? ?Patient is alert, oriented, no adenopathy, well-dressed, normal affect, normal respiratory effort. ?Examination patient has a healthy wound bed with approximately 75% healthy granulation tissue there is some mild superficial ischemic changes distally approximately 2 cm in diameter.  Patient was provided a prescription for wound care at the wound center 5 days a week.  Patient is currently on IV antibiotics with a PICC line with vancomycin and Maxipime.  Estimated end date May 5. ? ?Imaging: ?No results found. ? ? ?Labs: ?Lab Results  ?Component Value Date  ? HGBA1C 6.2 (H) 10/08/2021  ? HGBA1C 5.3 05/16/2019  ? HGBA1C 5.0 01/31/2019  ? ESRSEDRATE >140 (H) 03/28/2019  ? CRP 19.4 (H) 03/28/2019  ? REPTSTATUS 10/15/2021 FINAL 10/10/2021  ? GRAMSTAIN  10/10/2021  ?  NO WBC SEEN ?FEW GRAM NEGATIVE RODS ?RARE GRAM POSITIVE COCCI ?  ? CULT  10/10/2021  ?  ABUNDANT MORGANELLA MORGANII ?ABUNDANT SERRATIA MARCESCENS ?ABUNDANT CORYNEBACTERIUM SPECIES ?Standardized susceptibility testing for this organism is not available. ?NO ANAEROBES ISOLATED ?Performed at [facility], [place] [address] [place], [place] [address] ?  ? LABORGA MORGANELLA MORGANII 10/10/2021  ? LABORGA SERRATIA MARCESCENS 10/10/2021  ? ? ? ?Lab Results  ?Component Value Date  ? ALBUMIN 3.1 (L) 10/17/2021  ? ALBUMIN 2.9 (L) 10/15/2021  ? ALBUMIN 2.8 (L) 10/13/2021  ? PREALBUMIN 9.1 (L) 03/28/2019  ? ? ?Lab Results  ?Component Value Date  ? MG 2.0 10/14/2021  ? MG 1.9 10/08/2021  ? MG 2.2 11/27/2020  ? ?Lab Results  ?Component Value Date  ? VD25OH 21.82 (L) 10/14/2021  ? ? ?Lab Results  ?Component Value Date  ? PREALBUMIN 9.1 (L) 03/28/2019  ? ? ?  Latest Ref Rng & Units 10/17/2021  ? 12:30 PM 10/14/2021  ? 11:38 AM 10/13/2021  ?  9:39 AM  ?CBC EXTENDED  ?WBC 4.0 - 10.5 K/uL 10.9   12.7   13.4    ?RBC 4.22 - 5.81 MIL/uL 3.77   3.96   3.75    ?Hemoglobin 13.0 - 17.0 g/dL 12.5   12.9   12.3    ?HCT 39.0 - 52.0 % 38.8   40.6   38.4    ?Platelets 150 - 400 K/uL 245   250   265    ?NEUT# 1.7 - 7.7 K/uL 8.1   9.8     ?Lymph# 0.7 - 4.0 K/uL 0.9   0.8     ? ? ? ?  There is no height or weight on file to calculate BMI. ? ?Orders:  ?No orders of the defined types were placed in this encounter. ? ?No orders of the defined types were placed in this encounter. ? ? ? Procedures: ?No procedures performed ? ?Clinical Data: ?No additional findings. ? ?ROS: ? ?All other systems negative, except as noted in the HPI. ?Review of Systems ? ?Objective: ?Vital Signs: There were no vitals taken for this visit. ? ?Specialty Comments:  ?No specialty comments available. ? ?PMFS History: ?Patient Active Problem List  ? Diagnosis Date Noted  ? Cellulitis of right lower leg   ? Cerebral thrombosis with cerebral infarction 10/08/2021  ? 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 PVD (peripheral vascular disease) ([place])   ? Nonhealing wound of heel 10/06/2021  ? 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 Personal history of COVID-19 09/23/2020  ? COVID-19 09/11/2020  ? Acute respiratory failure with hypoxia ([place]) 11/06/2019  ? CAP  (community acquired pneumonia) 10/11/2019  ? Generalized hyperhidrosis 10/11/2019  ? Allergy, unspecified, initial encounter 10/03/2019  ? Diarrhea, unspecified 07/24/2019  ? Lower GI bleed 05/14/2019  ? Pressure injury of skin 04/27/2019  ? Admission for palliative care 04/27/2019  ? Major depressive disorder, single episode, unspecified 04/27/2019  ? Other disorders of calcium metabolism 04/27/2019  ? Palliative care encounter   ? Goals of care, counseling/discussion   ? Calciphylaxis   ? Wound infection 03/28/2019  ? Hypertension   ? Dyslipidemia   ? Diabetes mellitus with peripheral vascular disease ([place])   ? Depression   ? Physical deconditioning   ? Coagulation defect, unspecified ([place]) 03/15/2019  ? Other malaise 03/14/2019  ? Other specified disorders of the male genital organs 03/14/2019  ? Other specified symptoms and signs involving the circulatory and respiratory systems 03/14/2019  ? Unspecified protein-calorie malnutrition ([place]) 03/14/2019  ? Pruritus   ? Scrotal pain   ? Labile blood pressure   ? Scrotal edema   ? Status post below-knee amputation of left lower extremity ([place])   ? Slow transit constipation   ? Leukocytosis   ? Sleep disturbance   ? Essential hypertension   ? Anemia of chronic disease   ? Controlled type 2 diabetes mellitus with hyperglycemia, without long-term current use of insulin ([place])   ? ESRD on dialysis [facility])   ? Urinary retention   ? Debility 02/09/2019  ? Fluid overload 02/04/2019  ? Dyspnea, unspecified 02/03/2019  ? Gastro-esophageal reflux disease without esophagitis 02/03/2019  ? Hyperlipidemia, unspecified 02/03/2019  ? Iron deficiency anemia, unspecified 02/03/2019  ? Major depressive disorder, recurrent, in partial remission ([place]) 02/03/2019  ? Other problems related to lifestyle 02/03/2019  ? Pain in right leg 02/03/2019  ? Pain, unspecified 02/03/2019  ? Psychophysiologic insomnia 02/03/2019  ? Secondary hyperparathyroidism of renal origin ([place]) 02/03/2019  ?  Testicular hypofunction 02/03/2019  ? Hyperkalemia 01/30/2019  ? ARF (acute renal failure) ([place]) 01/30/2019  ? Hypokalemia 01/30/2019  ? Hypoglycemia 01/30/2019  ? Syphilis 01/30/2019  ? Macrocytic anemia 01/30/2019  ? ESRD (end stage renal disease) ([place])   ? ESRD (end stage renal disease) on dialysis ([place]) 01/2019  ? PDR (proliferative diabetic retinopathy) ([place]) 12/14/2013  ? Venous hypertension 09/22/2013  ? Acquired absence of left leg below knee ([place]) 07/20/2013  ? Diabetes mellitus type 2 in obese ([place]) 07/04/2013  ? Habitual alcohol use 07/04/2013  ? Obesity, Class II, BMI 35-39.9, with comorbidity 07/04/2013  ? ?Past Medical History:  ?Diagnosis Date  ?  Anemia   ? Anemia of chronic disease   ? Anxiety   ? ARF (acute renal failure) ([place]) 01/30/2019  ? Calciphylaxis   ? Controlled type 2 diabetes mellitus with hyperglycemia, without long-term current use of insulin ([place])   ? Debility 02/09/2019  ? Depression   ? Diabetes mellitus type 2 in obese ([place]) 07/04/2013  ? Diabetes mellitus with peripheral vascular disease ([place])   ? type 2 no meds, lost 100 lbs  ? Dyslipidemia   ? Dyspnea   ? inhaler  ? End stage renal disease ([place])   ? ESRD (end stage renal disease) on dialysis ([place]) 01/2019  ? MWFS  ? ESRD on dialysis [facility])   ? Essential hypertension   ? Fluid overload 02/04/2019  ? GERD (gastroesophageal reflux disease)   ? Goals of care, counseling/discussion   ? Habitual alcohol use 07/04/2013  ? Headache   ? Hyperkalemia 01/30/2019  ? Hypertension   ? Hypoglycemia 01/30/2019  ? Hypokalemia 01/30/2019  ? Labile blood pressure   ? Leukocytosis   ? Lower GI bleed 05/14/2019  ? Macrocytic anemia 01/30/2019  ? Obesity, Class II, BMI 35-39.9, with comorbidity 07/04/2013  ? Palliative care encounter   ? PDR (proliferative diabetic retinopathy) ([place]) 12/14/2013  ? Physical deconditioning   ? Pressure injury of skin 04/27/2019  ? Pruritus   ? Scrotal edema   ? Scrotal pain   ? Sleep apnea   ? uses CPAP  ? Sleep disturbance   ?  Slow transit constipation   ? Status post below-knee amputation of left lower extremity ([place])   ? Stroke [facility])   ? mini -shown on CT scan  ? Syphilis 01/2019  ? Urinary retention   ? Venous hypertension 09/22/2013

## 2021-11-04 NOTE — Telephone Encounter (Signed)
OV has been completed and now faxed to wound center of [place]. ?

## 2021-11-05 ENCOUNTER — Telehealth: Payer: Self-pay | Admitting: Orthopedic Surgery

## 2021-11-05 NOTE — Telephone Encounter (Signed)
Pt was to have f/u in our office and stated that he could have daily wound care in [place] and wanted a note faxed to that office yesterday so they could do this for him. The facility states that the pt has not been changing his dressing and upon removal part of the kerecis graft was removed in office and they would like to defer back to our clinic for wound management . Pt has a follow up appt on Tuesday 4/25 will hold this message to ensure pt comes in and to plan for next steps.  ?

## 2021-11-05 NOTE — Telephone Encounter (Signed)
Vac from [facility] called requesting a call back from Dr. Sharol Given or Autumn F. PA [place] states from [facility] pt's graphs need to be discussed. Please call [phone] extension [phone]. ?

## 2021-11-11 ENCOUNTER — Encounter: Payer: Medicare Other | Admitting: Family

## 2021-11-11 NOTE — Telephone Encounter (Signed)
Pt r/s to next Tuesday due to power outage the office will continue to keep the area clean and dry, elevate foot higher than heart and call with any changes in appearance of wound.  ?

## 2021-11-13 ENCOUNTER — Other Ambulatory Visit: Payer: Self-pay | Admitting: *Deleted

## 2021-11-13 DIAGNOSIS — S91309A Unspecified open wound, unspecified foot, initial encounter: Secondary | ICD-10-CM

## 2021-11-13 DIAGNOSIS — I87309 Chronic venous hypertension (idiopathic) without complications of unspecified lower extremity: Secondary | ICD-10-CM

## 2021-11-13 NOTE — Progress Notes (Signed)
orders

## 2021-11-18 ENCOUNTER — Encounter: Payer: Self-pay | Admitting: Family

## 2021-11-18 ENCOUNTER — Ambulatory Visit (INDEPENDENT_AMBULATORY_CARE_PROVIDER_SITE_OTHER): Payer: Medicare Other | Admitting: Family

## 2021-11-18 DIAGNOSIS — I739 Peripheral vascular disease, unspecified: Secondary | ICD-10-CM

## 2021-11-18 DIAGNOSIS — S91309A Unspecified open wound, unspecified foot, initial encounter: Secondary | ICD-10-CM

## 2021-11-18 DIAGNOSIS — L97314 Non-pressure chronic ulcer of right ankle with necrosis of bone: Secondary | ICD-10-CM

## 2021-11-18 DIAGNOSIS — S91301A Unspecified open wound, right foot, initial encounter: Secondary | ICD-10-CM

## 2021-11-18 NOTE — Progress Notes (Signed)
? ?Post-Op Visit Note ?  ?Patient: Michael Riggs           ?Date of Birth: [birth year]           ?MRN: [id number] ?Visit Date: 11/18/2021 ?PCP: [name], MD ? ?Chief Complaint:  ?Chief Complaint  ?Patient presents with  ? Right Achilles Tendon - Routine Post Op  ?  10/10/21 right achilles deb and kerecis graft/powder  ? ? ?HPI:  ?HPI ?The patient is a 51 year old gentleman seen today in follow-up for Kerecis grafting as well as powder application to a significant Achilles wound.  He did have debridement of the Achilles.  Surgery was March 24. ? ?Since that time it seems that his wound care has been inconsistent and he has been to our office for a few follow-up she is also been attempting to follow with the wound center in [place].  They have called [place] in they are unable to follow the patient as he is our postoperative patient. ? ?The patient is having trouble providing wound care for himself.  He does live with his brother who helps him from time to time ? ?Ortho Exam ?On examination of the right Achilles the graft is in place this is flat and dry.  There is some eschar.  There is no surrounding erythema no drainage no odor.  He does have staples in place circumferentially as well.   ? ? ?Visit Diagnoses:  ?1. Ankle ulcer, right, with necrosis of bone ([place])   ?2. PVD (peripheral vascular disease) ([place])   ?3. Nonhealing wound of heel   ?4. Calciphylaxis   ? ? ?Plan: Staples harvested today without incident.  Discussed the importance of daily dose of cleansing.  Silvadene dressings.  Daily care of the wound he will follow-up in the office in 2 weeks discussed return precautions. ? ?Follow-Up Instructions: Return in about 2 weeks (around 12/02/2021).  ? ?Imaging: ?No results found. ?  ? ?Orders:  ?No orders of the defined types were placed in this encounter. ? ?No orders of the defined types were placed in this encounter. ? ? ? ?PMFS History: ?Patient Active Problem List  ? Diagnosis Date Noted  ? Cellulitis of  right lower leg   ? Cerebral thrombosis with cerebral infarction 10/08/2021  ? 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 PVD (peripheral vascular disease) ([place])   ? Nonhealing wound of heel 10/06/2021  ? 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 Personal history of COVID-19 09/23/2020  ? COVID-19 09/11/2020  ? Acute respiratory failure with hypoxia ([place]) 11/06/2019  ? CAP (community acquired pneumonia) 10/11/2019  ? Generalized hyperhidrosis 10/11/2019  ? Allergy, unspecified, initial encounter 10/03/2019  ? Diarrhea, unspecified 07/24/2019  ? Lower GI bleed 05/14/2019  ? Pressure injury of skin 04/27/2019  ? Admission for palliative care 04/27/2019  ? Major depressive disorder, single episode, unspecified 04/27/2019  ? Other disorders of calcium metabolism 04/27/2019  ? Palliative care encounter   ? Goals of care, counseling/discussion   ? Calciphylaxis   ? Wound infection 03/28/2019  ? Hypertension   ? Dyslipidemia   ? Diabetes mellitus with peripheral vascular disease ([place])   ? Depression   ? Physical deconditioning   ? Coagulation defect, unspecified ([place]) 03/15/2019  ? Other malaise 03/14/2019  ? Other specified disorders of the male genital organs 03/14/2019  ? Other specified symptoms and signs involving the circulatory and respiratory systems 03/14/2019  ? Unspecified protein-calorie malnutrition ([place]) 03/14/2019  ? Pruritus   ?  Scrotal pain   ? Labile blood pressure   ? Scrotal edema   ? Status post below-knee amputation of left lower extremity ([place])   ? Slow transit constipation   ? Leukocytosis   ? Sleep disturbance   ? Essential hypertension   ? Anemia of chronic disease   ? Controlled type 2 diabetes mellitus with hyperglycemia, without long-term current use of insulin ([place])   ? ESRD on dialysis [facility])   ? Urinary retention   ? Debility 02/09/2019  ? Fluid overload 02/04/2019  ? Dyspnea, unspecified 02/03/2019  ? Gastro-esophageal reflux disease without esophagitis 02/03/2019  ?  Hyperlipidemia, unspecified 02/03/2019  ? Iron deficiency anemia, unspecified 02/03/2019  ? Major depressive disorder, recurrent, in partial remission ([place]) 02/03/2019  ? Other problems related to lifestyle 02/03/2019  ? Pain in right leg 02/03/2019  ? Pain, unspecified 02/03/2019  ? Psychophysiologic insomnia 02/03/2019  ? Secondary hyperparathyroidism of renal origin ([place]) 02/03/2019  ? Testicular hypofunction 02/03/2019  ? Hyperkalemia 01/30/2019  ? ARF (acute renal failure) ([place]) 01/30/2019  ? Hypokalemia 01/30/2019  ? Hypoglycemia 01/30/2019  ? Syphilis 01/30/2019  ? Macrocytic anemia 01/30/2019  ? ESRD (end stage renal disease) ([place])   ? ESRD (end stage renal disease) on dialysis ([place]) 01/2019  ? PDR (proliferative diabetic retinopathy) ([place]) 12/14/2013  ? Venous hypertension 09/22/2013  ? Acquired absence of left leg below knee ([place]) 07/20/2013  ? Diabetes mellitus type 2 in obese ([place]) 07/04/2013  ? Habitual alcohol use 07/04/2013  ? Obesity, Class II, BMI 35-39.9, with comorbidity 07/04/2013  ? ?Past Medical History:  ?Diagnosis Date  ? Anemia   ? Anemia of chronic disease   ? Anxiety   ? ARF (acute renal failure) ([place]) 01/30/2019  ? Calciphylaxis   ? Controlled type 2 diabetes mellitus with hyperglycemia, without long-term current use of insulin ([place])   ? Debility 02/09/2019  ? Depression   ? Diabetes mellitus type 2 in obese ([place]) 07/04/2013  ? Diabetes mellitus with peripheral vascular disease ([place])   ? type 2 no meds, lost 100 lbs  ? Dyslipidemia   ? Dyspnea   ? inhaler  ? End stage renal disease ([place])   ? ESRD (end stage renal disease) on dialysis ([place]) 01/2019  ? MWFS  ? ESRD on dialysis [facility])   ? Essential hypertension   ? Fluid overload 02/04/2019  ? GERD (gastroesophageal reflux disease)   ? Goals of care, counseling/discussion   ? Habitual alcohol use 07/04/2013  ? Headache   ? Hyperkalemia 01/30/2019  ? Hypertension   ? Hypoglycemia 01/30/2019  ? Hypokalemia 01/30/2019  ? Labile blood pressure   ?  Leukocytosis   ? Lower GI bleed 05/14/2019  ? Macrocytic anemia 01/30/2019  ? Obesity, Class II, BMI 35-39.9, with comorbidity 07/04/2013  ? Palliative care encounter   ? PDR (proliferative diabetic retinopathy) ([place]) 12/14/2013  ? Physical deconditioning   ? Pressure injury of skin 04/27/2019  ? Pruritus   ? Scrotal edema   ? Scrotal pain   ? Sleep apnea   ? uses CPAP  ? Sleep disturbance   ? Slow transit constipation   ? Status post below-knee amputation of left lower extremity ([place])   ? Stroke [facility])   ? mini -shown on CT scan  ? Syphilis 01/2019  ? Urinary retention   ? Venous hypertension 09/22/2013  ? Wound infection 03/28/2019  ?  ?Family History  ?Problem Relation Age of Onset  ? Diabetes Mother   ? Multiple myeloma  Mother   ?  ?Past Surgical History:  ?Procedure Laterality Date  ? ABDOMINAL AORTOGRAM W/LOWER EXTREMITY N/A 10/15/2021  ? Procedure: ABDOMINAL AORTOGRAM W/LOWER EXTREMITY;  Surgeon: Broadus John, MD;  Location: [facility];  Service: Cardiovascular;  Laterality: N/A;  ? AV FISTULA PLACEMENT Left 01/09/2019  ? Procedure: ARTERIOVENOUS (AV) FISTULA CREATION LEFT UPPER ARM;  Surgeon: [name], MD;  Location: [facility];  Service: Vascular;  Laterality: Left;  ? BASCILIC VEIN TRANSPOSITION Left 07/06/2019  ? Procedure: BASILIC VEIN TRANSPOSITION SECOND STAGE LEFT ARM;  Surgeon: [name], MD;  Location: [facility];  Service: Vascular;  Laterality: Left;  ? below the knee amputation Left   ? EYE SURGERY Bilateral   ? FLEXIBLE SIGMOIDOSCOPY N/A 05/15/2019  ? Procedure: FLEXIBLE SIGMOIDOSCOPY;  Surgeon: Irving Copas., MD;  Location: [facility];  Service: Gastroenterology;  Laterality: N/A;  ? HEMOSTASIS CLIP PLACEMENT  05/15/2019  ? Procedure: HEMOSTASIS CLIP PLACEMENT;  Surgeon: Irving Copas., MD;  Location: [facility];  Service: Gastroenterology;;  ? HOT HEMOSTASIS N/A 05/15/2019  ? Procedure: HOT HEMOSTASIS (ARGON PLASMA COAGULATION/BICAP);  Surgeon: Irving Copas.,  MD;  Location: [facility];  Service: Gastroenterology;  Laterality: N/A;  ? I & D EXTREMITY Right 10/10/2021  ? Procedure: RIGHT ACHILLES DEBRIDEMENT AND TISSUE GRAFT;  Surgeon: [name], MD;  Location: [facility] Downer

## 2021-11-28 ENCOUNTER — Encounter (HOSPITAL_COMMUNITY): Payer: Medicare Other

## 2021-12-02 ENCOUNTER — Encounter: Payer: Self-pay | Admitting: Family

## 2021-12-02 ENCOUNTER — Ambulatory Visit (INDEPENDENT_AMBULATORY_CARE_PROVIDER_SITE_OTHER): Payer: Medicare Other | Admitting: Family

## 2021-12-02 DIAGNOSIS — L97314 Non-pressure chronic ulcer of right ankle with necrosis of bone: Secondary | ICD-10-CM

## 2021-12-02 DIAGNOSIS — S91301A Unspecified open wound, right foot, initial encounter: Secondary | ICD-10-CM

## 2021-12-02 DIAGNOSIS — Z9889 Other specified postprocedural states: Secondary | ICD-10-CM

## 2021-12-02 DIAGNOSIS — S91309A Unspecified open wound, unspecified foot, initial encounter: Secondary | ICD-10-CM

## 2021-12-02 NOTE — Progress Notes (Signed)
? ?Post-Op Visit Note ?  ?Patient: Michael Riggs           ?Date of Birth: 1970/09/07           ?MRN: [id number] ?Visit Date: 12/02/2021 ?PCP: [name], MD ? ?Chief Complaint:  ?Chief Complaint  ?Patient presents with  ? Right Achilles Tendon - Routine Post Op  ?  10/10/21 right achilles deb and kerecis graft/powder ? ?  ? ? ?HPI:  ?HPI ?The patient is a 51 year old gentleman seen today in follow-up for Kerecis grafting as well as powder application to a significant Achilles wound.  He did have debridement of the Achilles.  Surgery was March 24. ? ? ? ?Ortho Exam ?On examination of the right lower leg Achilles area of the graft is in place.  There is reduced necrotic tissue.  Today there is no exposed Achilles tendon.  There is about 25% fibrinous exudative tissue with scant bloody drainage to the remaining granulation.  There is 10% eschar to the distal aspect.  There is no edema to his lower extremity no weeping no foul odor ? ? ?Visit Diagnoses:  ?1. Ankle ulcer, right, with necrosis of bone ([place])   ?2. Nonhealing wound of heel   ?3. Calciphylaxis   ?4. History of artificial skin graft   ? ? ?Plan: Silvadene dressing applied today with an Ace wrap.  The patient will do this daily follow-up in the office in 2 to 3 weeks ? ?Follow-Up Instructions: Return in about 20 days (around 12/22/2021).  ? ?Imaging: ?No results found. ?  ? ?Orders:  ?No orders of the defined types were placed in this encounter. ? ?No orders of the defined types were placed in this encounter. ? ? ? ?PMFS History: ?Patient Active Problem List  ? Diagnosis Date Noted  ? Cellulitis of right lower leg   ? Cerebral thrombosis with cerebral infarction 10/08/2021  ? 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 PVD (peripheral vascular disease) ([place])   ? Nonhealing wound of heel 10/06/2021  ? 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 Personal history of COVID-19 09/23/2020  ? COVID-19 09/11/2020  ? Acute respiratory failure with  hypoxia ([place]) 11/06/2019  ? CAP (community acquired pneumonia) 10/11/2019  ? Generalized hyperhidrosis 10/11/2019  ? Allergy, unspecified, initial encounter 10/03/2019  ? Diarrhea, unspecified 07/24/2019  ? Lower GI bleed 05/14/2019  ? Pressure injury of skin 04/27/2019  ? Admission for palliative care 04/27/2019  ? Major depressive disorder, single episode, unspecified 04/27/2019  ? Other disorders of calcium metabolism 04/27/2019  ? Palliative care encounter   ? Goals of care, counseling/discussion   ? Calciphylaxis   ? Wound infection 03/28/2019  ? Hypertension   ? Dyslipidemia   ? Diabetes mellitus with peripheral vascular disease ([place])   ? Depression   ? Physical deconditioning   ? Coagulation defect, unspecified ([place]) 03/15/2019  ? Other malaise 03/14/2019  ? Other specified disorders of the male genital organs 03/14/2019  ? Other specified symptoms and signs involving the circulatory and respiratory systems 03/14/2019  ? Unspecified protein-calorie malnutrition ([place]) 03/14/2019  ? Pruritus   ? Scrotal pain   ? Labile blood pressure   ? Scrotal edema   ? Status post below-knee amputation of left lower extremity ([place])   ? Slow transit constipation   ? Leukocytosis   ? Sleep disturbance   ? Essential hypertension   ? Anemia of chronic disease   ? Controlled type 2 diabetes mellitus with hyperglycemia, without  long-term current use of insulin ([place])   ? ESRD on dialysis [facility])   ? Urinary retention   ? Debility 02/09/2019  ? Fluid overload 02/04/2019  ? Dyspnea, unspecified 02/03/2019  ? Gastro-esophageal reflux disease without esophagitis 02/03/2019  ? Hyperlipidemia, unspecified 02/03/2019  ? Iron deficiency anemia, unspecified 02/03/2019  ? Major depressive disorder, recurrent, in partial remission ([place]) 02/03/2019  ? Other problems related to lifestyle 02/03/2019  ? Pain in right leg 02/03/2019  ? Pain, unspecified 02/03/2019  ? Psychophysiologic insomnia 02/03/2019  ? Secondary hyperparathyroidism of renal  origin ([place]) 02/03/2019  ? Testicular hypofunction 02/03/2019  ? Hyperkalemia 01/30/2019  ? ARF (acute renal failure) ([place]) 01/30/2019  ? Hypokalemia 01/30/2019  ? Hypoglycemia 01/30/2019  ? Syphilis 01/30/2019  ? Macrocytic anemia 01/30/2019  ? ESRD (end stage renal disease) ([place])   ? ESRD (end stage renal disease) on dialysis ([place]) 01/2019  ? PDR (proliferative diabetic retinopathy) ([place]) 12/14/2013  ? Venous hypertension 09/22/2013  ? Acquired absence of left leg below knee ([place]) 07/20/2013  ? Diabetes mellitus type 2 in obese ([place]) 07/04/2013  ? Habitual alcohol use 07/04/2013  ? Obesity, Class II, BMI 35-39.9, with comorbidity 07/04/2013  ? ?Past Medical History:  ?Diagnosis Date  ? Anemia   ? Anemia of chronic disease   ? Anxiety   ? ARF (acute renal failure) ([place]) 01/30/2019  ? Calciphylaxis   ? Controlled type 2 diabetes mellitus with hyperglycemia, without long-term current use of insulin ([place])   ? Debility 02/09/2019  ? Depression   ? Diabetes mellitus type 2 in obese ([place]) 07/04/2013  ? Diabetes mellitus with peripheral vascular disease ([place])   ? type 2 no meds, lost 100 lbs  ? Dyslipidemia   ? Dyspnea   ? inhaler  ? End stage renal disease ([place])   ? ESRD (end stage renal disease) on dialysis ([place]) 01/2019  ? MWFS  ? ESRD on dialysis [facility])   ? Essential hypertension   ? Fluid overload 02/04/2019  ? GERD (gastroesophageal reflux disease)   ? Goals of care, counseling/discussion   ? Habitual alcohol use 07/04/2013  ? Headache   ? Hyperkalemia 01/30/2019  ? Hypertension   ? Hypoglycemia 01/30/2019  ? Hypokalemia 01/30/2019  ? Labile blood pressure   ? Leukocytosis   ? Lower GI bleed 05/14/2019  ? Macrocytic anemia 01/30/2019  ? Obesity, Class II, BMI 35-39.9, with comorbidity 07/04/2013  ? Palliative care encounter   ? PDR (proliferative diabetic retinopathy) ([place]) 12/14/2013  ? Physical deconditioning   ? Pressure injury of skin 04/27/2019  ? Pruritus   ? Scrotal edema   ? Scrotal pain   ? Sleep apnea   ? uses CPAP   ? Sleep disturbance   ? Slow transit constipation   ? Status post below-knee amputation of left lower extremity ([place])   ? Stroke [facility])   ? mini -shown on CT scan  ? Syphilis 01/2019  ? Urinary retention   ? Venous hypertension 09/22/2013  ? Wound infection 03/28/2019  ?  ?Family History  ?Problem Relation Age of Onset  ? Diabetes Mother   ? Multiple myeloma Mother   ?  ?Past Surgical History:  ?Procedure Laterality Date  ? ABDOMINAL AORTOGRAM W/LOWER EXTREMITY N/A 10/15/2021  ? Procedure: ABDOMINAL AORTOGRAM W/LOWER EXTREMITY;  Surgeon: Broadus John, MD;  Location: [facility];  Service: Cardiovascular;  Laterality: N/A;  ? AV FISTULA PLACEMENT Left 01/09/2019  ? Procedure: ARTERIOVENOUS (AV) FISTULA CREATION LEFT UPPER ARM;  Surgeon: [name], MD;  Location: [place];  Service: Vascular;  Laterality: Left;  ? BASCILIC VEIN TRANSPOSITION Left 07/06/2019  ? Procedure: BASILIC VEIN TRANSPOSITION SECOND STAGE LEFT ARM;  Surgeon: [name], MD;  [facility];  Service: Vascular;  Laterality: Left;  ? below the knee amputation Left   ? EYE SURGERY Bilateral   ? FLEXIBLE SIGMOIDOSCOPY N/A 05/15/2019  ? Procedure: FLEXIBLE SIGMOIDOSCOPY;  Surgeon: Irving Copas., MD;  Location: [place];  Service: Gastroenterology;  Laterality: N/A;  ? HEMOSTASIS CLIP PLACEMENT  05/15/2019  ? Procedure: HEMOSTASIS CLIP PLACEMENT;  Surgeon: Irving Copas., MD;  Location: [place];  Service: Gastroenterology;;  ? HOT HEMOSTASIS N/A 05/15/2019  ? Procedure: HOT HEMOSTASIS (ARGON PLASMA COAGULATION/BICAP);  Surgeon: Irving Copas., MD;  Location: [place];  Service: Gastroenterology;  Laterality: N/A;  ? I & D EXTREMITY Right 10/10/2021  ? Procedure: RIGHT ACHILLES DEBRIDEMENT AND TISSUE GRAFT;  Surgeon: [name], MD;  Location: [place];  Service: Orthopedics;  Laterality: Right;  ? IR FLUORO GUIDE CV LINE RIGHT  01/31/2019  ? IR FLUORO GUIDE CV LINE RIGHT  02/03/2019  ? IR THORACENTESIS  ASP PLEURAL SPACE W/IMG GUIDE  11/08/2019  ? IR US GUIDE VASC ACCESS RIGHT  01/31/2019  ? IR US GUIDE VASC ACCESS RIGHT  02/03/2019  ? PERIPHERAL VASCULAR BALLOON ANGIOPLASTY  10/15/2021  ? Procedure: PERIPHERAL V

## 2021-12-04 ENCOUNTER — Ambulatory Visit (INDEPENDENT_AMBULATORY_CARE_PROVIDER_SITE_OTHER)
Admission: RE | Admit: 2021-12-04 | Discharge: 2021-12-04 | Disposition: A | Discharge status: Home / Self Care | Payer: Medicare Other | Source: Ambulatory Visit | Attending: Vascular Surgery | Admitting: Vascular Surgery

## 2021-12-04 ENCOUNTER — Ambulatory Visit (HOSPITAL_COMMUNITY)
Admission: RE | Admit: 2021-12-04 | Discharge: 2021-12-04 | Disposition: A | Discharge status: Home / Self Care | Payer: Medicare Other | Source: Ambulatory Visit | Attending: Vascular Surgery | Admitting: Vascular Surgery

## 2021-12-04 DIAGNOSIS — I87309 Chronic venous hypertension (idiopathic) without complications of unspecified lower extremity: Secondary | ICD-10-CM | POA: Diagnosis present

## 2021-12-04 DIAGNOSIS — S91309A Unspecified open wound, unspecified foot, initial encounter: Secondary | ICD-10-CM

## 2021-12-04 DIAGNOSIS — S91301A Unspecified open wound, right foot, initial encounter: Secondary | ICD-10-CM

## 2021-12-05 ENCOUNTER — Ambulatory Visit (INDEPENDENT_AMBULATORY_CARE_PROVIDER_SITE_OTHER): Payer: Medicare Other | Admitting: Physician Assistant

## 2021-12-05 VITALS — BP 113/76 | HR 72 | Temp 97.8°F | Resp 20 | Ht 70.0 in

## 2021-12-05 DIAGNOSIS — S91309A Unspecified open wound, unspecified foot, initial encounter: Secondary | ICD-10-CM | POA: Diagnosis not present

## 2021-12-05 NOTE — Progress Notes (Signed)
Office Note     CC:  follow up Requesting Provider:  [name], MD  HPI: Michael Riggs is a 51 y.o. (06/18/1971) male who presents status post right SFA, ATA, and TP trunk drug-coated balloon angioplasty by Dr. Virl Cagey on 10/15/2021.  He subsequently underwent debridement of right posterior ankle wound with placement of skin substitute by Dr. Sharol Given.  He returns to clinic today for surveillance of endovascular intervention.  Patient believes the wound is improving.  He completed his 6 weeks of IV antibiotics via PICC line.  He continues to take antibiotics during dialysis treatments via fistula.  He is adamantly against right leg amputation at this time.  He would like to continue wound care for as long as possible.   Past Medical History:  Diagnosis Date   Anemia    Anemia of chronic disease    Anxiety    ARF (acute renal failure) ([place]) 01/30/2019   Calciphylaxis    Controlled type 2 diabetes mellitus with hyperglycemia, without long-term current use of insulin ([place])    Debility 02/09/2019   Depression    Diabetes mellitus type 2 in obese ([place]) 07/04/2013   Diabetes mellitus with peripheral vascular disease ([place])    type 2 no meds, lost 100 lbs   Dyslipidemia    Dyspnea    inhaler   End stage renal disease ([place])    ESRD (end stage renal disease) on dialysis ([place]) 01/2019   MWFS   ESRD on dialysis [facility])    Essential hypertension    Fluid overload 02/04/2019   GERD (gastroesophageal reflux disease)    Goals of care, counseling/discussion    Habitual alcohol use 07/04/2013   Headache    Hyperkalemia 01/30/2019   Hypertension    Hypoglycemia 01/30/2019   Hypokalemia 01/30/2019   Labile blood pressure    Leukocytosis    Lower GI bleed 05/14/2019   Macrocytic anemia 01/30/2019   Obesity, Class II, BMI 35-39.9, with comorbidity 07/04/2013   Palliative care encounter    PDR (proliferative diabetic retinopathy) ([place]) 12/14/2013   Physical deconditioning    Pressure injury of  skin 04/27/2019   Pruritus    Scrotal edema    Scrotal pain    Sleep apnea    uses CPAP   Sleep disturbance    Slow transit constipation    Status post below-knee amputation of left lower extremity ([place])    Stroke ([place])    mini -shown on CT scan   Syphilis 01/2019   Urinary retention    Venous hypertension 09/22/2013   Wound infection 03/28/2019    Past Surgical History:  Procedure Laterality Date   ABDOMINAL AORTOGRAM W/LOWER EXTREMITY N/A 10/15/2021   Procedure: ABDOMINAL AORTOGRAM W/LOWER EXTREMITY;  Surgeon: Broadus John, MD;  Location: [place] [facility];  Service: Cardiovascular;  Laterality: N/A;   AV FISTULA PLACEMENT Left 01/09/2019   Procedure: ARTERIOVENOUS (AV) FISTULA CREATION LEFT UPPER ARM;  Surgeon: [name], MD;  [facility];  Service: Vascular;  Laterality: Left;   [place] Left 07/06/2019   Procedure: BASILIC VEIN TRANSPOSITION SECOND STAGE LEFT ARM;  Surgeon: [name], MD;  Location: [place];  Service: Vascular;  Laterality: Left;   below the knee amputation Left    EYE SURGERY Bilateral    FLEXIBLE SIGMOIDOSCOPY N/A 05/15/2019   Procedure: FLEXIBLE SIGMOIDOSCOPY;  Surgeon: Irving Copas., MD;  Location: [place];  Service: Gastroenterology;  Laterality: N/A;   HEMOSTASIS CLIP PLACEMENT  05/15/2019  Procedure: HEMOSTASIS CLIP PLACEMENT;  Surgeon: Irving Copas., MD;  Location: [place];  Service: Gastroenterology;;   HOT HEMOSTASIS N/A 05/15/2019   Procedure: HOT HEMOSTASIS (ARGON PLASMA COAGULATION/BICAP);  Surgeon: Irving Copas., MD;  Location: [place];  Service: Gastroenterology;  Laterality: N/A;   I & D EXTREMITY Right 10/10/2021   Procedure: RIGHT ACHILLES DEBRIDEMENT AND TISSUE GRAFT;  Surgeon: [name], MD;  Location: [place];  Service: Orthopedics;  Laterality: Right;   IR FLUORO GUIDE CV LINE RIGHT  01/31/2019   IR FLUORO GUIDE CV LINE RIGHT  02/03/2019   IR THORACENTESIS ASP  PLEURAL SPACE W/IMG GUIDE  11/08/2019   IR US GUIDE VASC ACCESS RIGHT  01/31/2019   IR US GUIDE VASC ACCESS RIGHT  02/03/2019   PERIPHERAL VASCULAR BALLOON ANGIOPLASTY  10/15/2021   Procedure: PERIPHERAL VASCULAR BALLOON ANGIOPLASTY;  Surgeon: Broadus John, MD;  Location: [place];  Service: Cardiovascular;;  rt sfa and trifurcation   SCLEROTHERAPY  05/15/2019   Procedure: SCLEROTHERAPY;  Surgeon: Mansouraty, Telford Nab., MD;  Location: [facility];  Service: Gastroenterology;;    Social History   Socioeconomic History   Marital status: Divorced    Spouse name: Not on file   Number of children: Not on file   Years of education: Not on file   Highest education level: Not on file  Occupational History   Not on file  Tobacco Use   Smoking status: Former    Types: Cigarettes    Start date: 11/18/2018    Quit date: 01/18/2019    Years since quitting: 2.8    Passive exposure: Never   Smokeless tobacco: Never   Tobacco comments:    pt stated got bored with it  Vaping Use   Vaping Use: Never used  Substance and Sexual Activity   Alcohol use: Not Currently    Comment: heavy drinker in the past, none since 07/19/18   Drug use: Not Currently   Sexual activity: Not Currently  Other Topics Concern   Not on file  Social History Narrative   Not on file   Social Determinants of Health   Financial Resource Strain: Not on file  Food Insecurity: Not on file  Transportation Needs: Not on file  Physical Activity: Not on file  Stress: Not on file  Social Connections: Not on file  Intimate Partner Violence: Not on file    Family History  Problem Relation Age of Onset   Diabetes Mother    Multiple myeloma Mother     Current Outpatient Medications  Medication Sig Dispense Refill   acetaminophen (TYLENOL) 500 MG tablet Take 1,000 mg by mouth every 6 (six) hours as needed for mild pain.     albuterol (VENTOLIN HFA) 108 (90 Base) MCG/ACT inhaler Inhale 2 puffs into the lungs  every 6 (six) hours as needed for wheezing or shortness of breath.     amLODipine (NORVASC) 5 MG tablet Take 5 mg by mouth at bedtime.      aspirin 81 MG EC tablet Take 1 tablet (81 mg total) by mouth daily. Swallow whole. 30 tablet 11   atorvastatin (LIPITOR) 40 MG tablet Take 1 tablet (40 mg total) by mouth daily. (Patient taking differently: Take 40 mg by mouth at bedtime.)     augmented betamethasone dipropionate (DIPROLENE-AF) 0.05 % cream Apply 1 application. topically daily.     bromocriptine (PARLODEL) 2.5 MG tablet TAKE 1 TABLET BY MOUTH AT BEDTIME. 90 tablet 1   buPROPion (  WELLBUTRIN XL) 300 MG 24 hr tablet Take 1 tablet (300 mg total) by mouth in the morning.     calcitRIOL (ROCALTROL) 0.5 MCG capsule Take 4 capsules (2 mcg total) by mouth every Monday, Wednesday, and Friday with hemodialysis.     clopidogrel (PLAVIX) 75 MG tablet Take 1 tablet (75 mg total) by mouth daily with breakfast. 30 tablet 2   ferric citrate (AURYXIA) 1 GM 210 MG(Fe) tablet Take 630 mg by mouth 3 (three) times daily with meals.     ipratropium-albuterol (DUONEB) 0.5-2.5 (3) MG/3ML SOLN Take 3 mLs by nebulization every 6 (six) hours as needed (For shortness of breath).     Melatonin 10 MG TABS Take 10 mg by mouth at bedtime.     metoprolol tartrate (LOPRESSOR) 25 MG tablet Take 1 tablet (25 mg total) by mouth 2 (two) times daily. 60 tablet 0   midodrine (PROAMATINE) 10 MG tablet Take 10 mg by mouth See admin instructions. Takes 34m on days with dialysis     ondansetron (ZOFRAN) 4 MG tablet Take 4 mg by mouth daily as needed for vomiting.     oxycodone (OXY-IR) 5 MG capsule Take 1 capsule (5 mg total) by mouth every 6 (six) hours as needed. 20 capsule 0   pantoprazole (PROTONIX) 40 MG tablet Take 1 tablet (40 mg total) by mouth daily. (Patient taking differently: Take 40 mg by mouth at bedtime.)     sucroferric oxyhydroxide (VELPHORO) 500 MG chewable tablet Chew 1,000 mg by mouth 3 (three) times daily with meals.      tamsulosin (FLOMAX) 0.4 MG CAPS capsule Take 1 capsule (0.4 mg total) by mouth daily after breakfast. (Patient taking differently: Take 0.4 mg by mouth at bedtime.) 30 capsule    vitamin C (ASCORBIC ACID) 500 MG tablet Take 500 mg by mouth daily.     No current facility-administered medications for this visit.    Allergies  Allergen Reactions   Tape Itching    Prefers silk tape over regular tape     REVIEW OF SYSTEMS:   _0  denotes positive finding, _1  denotes negative finding Cardiac  Comments:  Chest pain or chest pressure:    Shortness of breath upon exertion:    Short of breath when lying flat:    Irregular heart rhythm:        Vascular    Pain in calf, thigh, or hip brought on by ambulation:    Pain in feet at night that wakes you up from your sleep:     Blood clot in your veins:    Leg swelling:         Pulmonary    Oxygen at home:    Productive cough:     Wheezing:         Neurologic    Sudden weakness in arms or legs:     Sudden numbness in arms or legs:     Sudden onset of difficulty speaking or slurred speech:    Temporary loss of vision in one eye:     Problems with dizziness:         Gastrointestinal    Blood in stool:     Vomited blood:         Genitourinary    Burning when urinating:     Blood in urine:        Psychiatric    Major depression:         Hematologic    Bleeding problems:  Problems with blood clotting too easily:        Skin    Rashes or ulcers:        Constitutional    Fever or chills:      PHYSICAL EXAMINATION:  Vitals:   12/05/21 1027  BP: 113/76  Pulse: 72  Resp: 20  Temp: 97.8 F (36.6 C)  TempSrc: Temporal  SpO2: 96%  Height: _0  (1.778 m)    General:  WDWN in NAD; vital signs documented above Gait: Not observed HENT: WNL, normocephalic Pulmonary: normal non-labored breathing , without Rales, rhonchi,  wheezing Cardiac: regular HR Abdomen: soft, NT, no masses Skin: without rashes Vascular  Exam/Pulses: palpable R DP RLE: Extensive posterior ankle wound of the right lower extremity with drainage Musculoskeletal: no muscle wasting or atrophy  Neurologic: A&O X 3;  No focal weakness or paresthesias are detected Psychiatric:  The pt has Normal affect.   Non-Invasive Vascular Imaging:   Patent SFA and, TP trunk, and AT by arterial duplex ABI/TBIToday's ABI     Today's TBIPrevious ABI    Previous TBI  +-------+----------------+-----------+----------------+------------+  Right  Non compressible0.76       Non compressible0.50          +-------+----------------+-----------+----------------+------------+  Left   BKA                        BKA                           +-------+----------------+-----------+----------------+------------+    ASSESSMENT/PLAN:: 51 y.o. male status post right SFA, TP trunk, ATA drug coated balloon angioplasty  -Endovascular interventions are patent by arterial duplex today.  On exam patient has a palpable DP pulse -Despite the above he has a large posterior ankle wound however patient states this seems to be improving -Agree with continued antibiotics with HD -Patient has follow-up with Dr. Sharol Given for continued wound care; he is adamantly against an amputation currently -Recheck right leg arterial duplex and ABI in 3 months   Dagoberto Ligas, PA-C [facility] [phone]  Clinic MD:   Virl Cagey

## 2021-12-08 ENCOUNTER — Telehealth: Payer: Self-pay | Admitting: Radiology

## 2021-12-08 NOTE — Telephone Encounter (Signed)
Patient left voicemail on triage line stating that he went to dialysis today and they gave him antibiotics because his surgical area is not looking so good. He would like for you to call him back as soon as possible to advise if it is okay to take antibiotics.  CB [phone]

## 2021-12-09 NOTE — Telephone Encounter (Signed)
Call and see if he wants to come in this week. Should take abx as instructed.

## 2021-12-09 NOTE — Telephone Encounter (Signed)
Pt informed. He has an appt on 12/11/21 to be seen

## 2021-12-11 ENCOUNTER — Ambulatory Visit (INDEPENDENT_AMBULATORY_CARE_PROVIDER_SITE_OTHER): Payer: Medicare Other | Admitting: Orthopedic Surgery

## 2021-12-11 ENCOUNTER — Other Ambulatory Visit: Payer: Self-pay | Admitting: *Deleted

## 2021-12-11 ENCOUNTER — Telehealth: Payer: Self-pay

## 2021-12-11 DIAGNOSIS — S91309A Unspecified open wound, unspecified foot, initial encounter: Secondary | ICD-10-CM

## 2021-12-11 DIAGNOSIS — S91301A Unspecified open wound, right foot, initial encounter: Secondary | ICD-10-CM

## 2021-12-11 MED ORDER — SILVER SULFADIAZINE 1 % EX CREA: 1.0000 "application " | TOPICAL_CREAM | Freq: Every day | CUTANEOUS | 3 refills | Status: AC

## 2021-12-11 NOTE — Telephone Encounter (Signed)
Patient called states his dressing is bloody. Spoke to Autumn F.  Advised on message below.  "if he has silvadene ointment at home he can apply that. he can change several times a day to keep up with the draining but he should NOT be walking on his foot and should elevate."

## 2021-12-17 ENCOUNTER — Encounter: Payer: Self-pay | Admitting: Orthopedic Surgery

## 2021-12-17 NOTE — Progress Notes (Signed)
Office Visit Note   Patient: Michael Riggs           Date of Birth: 1971/03/02           MRN: [id number] Visit Date: 12/11/2021              Requested by: [name], MD [address] [place],  [place] [address] PCP: [name], MD  Chief Complaint  Patient presents with   Right Achilles Tendon - Routine Post Op    10/10/21 right achilles deb w/ kerecis      HPI: Patient is a 51 year old gentleman who is 2 months status post right Achilles debridement and application of Kerecis graft.  Patient is on dialysis and obtaining antibiotics through dialysis.  He is status post vascular surgery on May 19.  Assessment & Plan: Visit Diagnoses:  1. Nonhealing wound of heel     Plan: Recommend resume Silvadene dressing changes and dry dressing with Dial soap cleansing.  Prescription called in for Silvadene.  Follow-Up Instructions: Return in about 2 weeks (around 12/25/2021).   Ortho Exam  Patient is alert, oriented, no adenopathy, well-dressed, normal affect, normal respiratory effort. Examination he has fibrinous tissue over the wound bed this was debrided there was good bleeding granulation tissue no exposed tendon.  Patient has been using a dry dressing we will have him resume the Silvadene.  Imaging: No results found.   Labs: Lab Results  Component Value Date   HGBA1C 6.2 (H) 10/08/2021   HGBA1C 5.3 05/16/2019   HGBA1C 5.0 01/31/2019   ESRSEDRATE >140 (H) 03/28/2019   CRP 19.4 (H) 03/28/2019   REPTSTATUS 10/15/2021 FINAL 10/10/2021   GRAMSTAIN  10/10/2021    NO WBC SEEN FEW GRAM NEGATIVE RODS RARE GRAM POSITIVE COCCI    CULT  10/10/2021    ABUNDANT MORGANELLA MORGANII ABUNDANT SERRATIA MARCESCENS ABUNDANT CORYNEBACTERIUM SPECIES Standardized susceptibility testing for this organism is not available. NO ANAEROBES ISOLATED Performed at [facility], [place] [address], [place], [place] [address]    LABORGA [facility] MORGANII 10/10/2021   LABORGA  SERRATIA MARCESCENS 10/10/2021     Lab Results  Component Value Date   ALBUMIN 3.1 (L) 10/17/2021   ALBUMIN 2.9 (L) 10/15/2021   ALBUMIN 2.8 (L) 10/13/2021   PREALBUMIN 9.1 (L) 03/28/2019    Lab Results  Component Value Date   MG 2.0 10/14/2021   MG 1.9 10/08/2021   MG 2.2 11/27/2020   Lab Results  Component Value Date   VD25OH 21.82 (L) 10/14/2021    Lab Results  Component Value Date   PREALBUMIN 9.1 (L) 03/28/2019      Latest Ref Rng & Units 10/17/2021   12:30 PM 10/14/2021   11:38 AM 10/13/2021    9:39 AM  CBC EXTENDED  WBC 4.0 - 10.5 K/uL 10.9   12.7   13.4    RBC 4.22 - 5.81 MIL/uL 3.77   3.96   3.75    Hemoglobin 13.0 - 17.0 g/dL 12.5   12.9   12.3    HCT 39.0 - 52.0 % 38.8   40.6   38.4    Platelets 150 - 400 K/uL 245   250   265    NEUT# 1.7 - 7.7 K/uL 8.1   9.8     Lymph# 0.7 - 4.0 K/uL 0.9   0.8        There is no height or weight on file to calculate BMI.  Orders:  No orders  of the defined types were placed in this encounter.  Meds ordered this encounter  Medications   silver sulfADIAZINE (SILVADENE) 1 % cream    Sig: Apply 1 application. topically daily. Apply to affected area daily plus dry dressing    Dispense:  400 g    Refill:  3     Procedures: No procedures performed  Clinical Data: No additional findings.  ROS:  All other systems negative, except as noted in the HPI. Review of Systems  Objective: Vital Signs: There were no vitals taken for this visit.  Specialty Comments:  No specialty comments available.  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   Personal history of COVID-19 09/23/2020   COVID-19 09/11/2020   Acute respiratory  failure with hypoxia ([place]) 11/06/2019   CAP (community acquired pneumonia) 10/11/2019   Generalized hyperhidrosis 10/11/2019   Allergy, unspecified, initial encounter 10/03/2019   Diarrhea, unspecified 07/24/2019   Lower GI bleed 05/14/2019   Pressure injury of skin 04/27/2019   Admission for palliative care 04/27/2019   Major depressive disorder, single episode, unspecified 04/27/2019   Other disorders of calcium metabolism 04/27/2019   Palliative care encounter    Goals of care, counseling/discussion    Calciphylaxis    Wound infection 03/28/2019   Hypertension    Dyslipidemia    Diabetes mellitus with peripheral vascular disease ([place])    Depression    Physical deconditioning    Coagulation defect, unspecified ([place]) 03/15/2019   Other malaise 03/14/2019   Other specified disorders of the male genital organs 03/14/2019   Other specified symptoms and signs involving the circulatory and respiratory systems 03/14/2019   Unspecified protein-calorie malnutrition ([place]) 03/14/2019   Pruritus    Scrotal pain    Labile blood pressure    Scrotal edema    Status post below-knee amputation of left lower extremity (HCC)    Slow transit constipation    Leukocytosis    Sleep disturbance    Essential hypertension    Anemia of chronic disease    Controlled type 2 diabetes mellitus with hyperglycemia, without long-term current use of insulin ([place])    ESRD on dialysis ([place])    Urinary retention    Debility 02/09/2019   Fluid overload 02/04/2019   Dyspnea, unspecified 02/03/2019   Gastro-esophageal reflux disease without esophagitis 02/03/2019   Hyperlipidemia, unspecified 02/03/2019   Iron deficiency anemia, unspecified 02/03/2019   Major depressive disorder, recurrent, in partial remission ([place]) 02/03/2019   Other problems related to lifestyle 02/03/2019   Pain in right leg 02/03/2019   Pain, unspecified 02/03/2019   Psychophysiologic insomnia 02/03/2019   Secondary hyperparathyroidism  of renal origin ([place]) 02/03/2019   Testicular hypofunction 02/03/2019   Hyperkalemia 01/30/2019   ARF (acute renal failure) ([place]) 01/30/2019   Hypokalemia 01/30/2019   Hypoglycemia 01/30/2019   Syphilis 01/30/2019   Macrocytic anemia 01/30/2019   ESRD (end stage renal disease) ([place])    ESRD (end stage renal disease) on dialysis ([place]) 01/2019   PDR (proliferative diabetic retinopathy) ([place]) 12/14/2013   Venous hypertension 09/22/2013   Acquired absence of left leg below knee ([place]) 07/20/2013   Diabetes mellitus type 2 in obese ([place]) 07/04/2013   Habitual alcohol use 07/04/2013  Obesity, Class II, BMI 35-39.9, with comorbidity 07/04/2013   Past Medical History:  Diagnosis Date   Anemia    Anemia of chronic disease    Anxiety    ARF (acute renal failure) ([place]) 01/30/2019   Calciphylaxis    Controlled type 2 diabetes mellitus with hyperglycemia, without long-term current use of insulin ([place])    Debility 02/09/2019   Depression    Diabetes mellitus type 2 in obese ([place]) 07/04/2013   Diabetes mellitus with peripheral vascular disease ([place])    type 2 no meds, lost 100 lbs   Dyslipidemia    Dyspnea    inhaler   End stage renal disease ([place])    ESRD (end stage renal disease) on dialysis ([place]) 01/2019   MWFS   ESRD on dialysis [facility]    Essential hypertension    Fluid overload 02/04/2019   GERD (gastroesophageal reflux disease)    Goals of care, counseling/discussion    Habitual alcohol use 07/04/2013   Headache    Hyperkalemia 01/30/2019   Hypertension    Hypoglycemia 01/30/2019   Hypokalemia 01/30/2019   Labile blood pressure    Leukocytosis    Lower GI bleed 05/14/2019   Macrocytic anemia 01/30/2019   Obesity, Class II, BMI 35-39.9, with comorbidity 07/04/2013   Palliative care encounter    PDR (proliferative diabetic retinopathy) ([place]) 12/14/2013   Physical deconditioning    Pressure injury of skin 04/27/2019   Pruritus    Scrotal edema    Scrotal pain    Sleep apnea     uses CPAP   Sleep disturbance    Slow transit constipation    Status post below-knee amputation of left lower extremity ([place])    Stroke ([place])    mini -shown on CT scan   Syphilis 01/2019   Urinary retention    Venous hypertension 09/22/2013   Wound infection 03/28/2019    Family History  Problem Relation Age of Onset   Diabetes Mother    Multiple myeloma Mother     Past Surgical History:  Procedure Laterality Date   ABDOMINAL AORTOGRAM W/LOWER EXTREMITY N/A 10/15/2021   Procedure: ABDOMINAL AORTOGRAM W/LOWER EXTREMITY;  Surgeon: Broadus John, MD;  Location: [facility];  Service: Cardiovascular;  Laterality: N/A;   AV FISTULA PLACEMENT Left 01/09/2019   Procedure: ARTERIOVENOUS (AV) FISTULA CREATION LEFT UPPER ARM;  Surgeon: [name], MD;  Location: [facility];  Service: Vascular;  Laterality: Left;   [place] Left 07/06/2019   Procedure: BASILIC VEIN TRANSPOSITION SECOND STAGE LEFT ARM;  Surgeon: [name], MD;  Location: [facility];  Service: Vascular;  Laterality: Left;   below the knee amputation Left    EYE SURGERY Bilateral    FLEXIBLE SIGMOIDOSCOPY N/A 05/15/2019   Procedure: FLEXIBLE SIGMOIDOSCOPY;  Surgeon: Irving Copas., MD;  Location: [facility];  Service: Gastroenterology;  Laterality: N/A;   HEMOSTASIS CLIP PLACEMENT  05/15/2019   Procedure: HEMOSTASIS CLIP PLACEMENT;  Surgeon: Irving Copas., MD;  Location: [facility];  Service: Gastroenterology;;   HOT HEMOSTASIS N/A 05/15/2019   Procedure: HOT HEMOSTASIS (ARGON PLASMA COAGULATION/BICAP);  Surgeon: Irving Copas., MD;  Location: [facility];  Service: Gastroenterology;  Laterality: N/A;   I & D EXTREMITY Right 10/10/2021   Procedure: RIGHT ACHILLES DEBRIDEMENT AND TISSUE GRAFT;  Surgeon: [name], MD;  Location: [facility];  Service: Orthopedics;  Laterality: Right;   IR FLUORO GUIDE CV LINE RIGHT  01/31/2019   IR FLUORO GUIDE CV LINE  RIGHT  02/03/2019   IR  THORACENTESIS ASP PLEURAL SPACE W/IMG GUIDE  11/08/2019   IR US GUIDE VASC ACCESS RIGHT  01/31/2019   IR US GUIDE VASC ACCESS RIGHT  02/03/2019   PERIPHERAL VASCULAR BALLOON ANGIOPLASTY  10/15/2021   Procedure: PERIPHERAL VASCULAR BALLOON ANGIOPLASTY;  Surgeon: Broadus John, MD;  Location: [facility];  Service: Cardiovascular;;  rt sfa and trifurcation   SCLEROTHERAPY  05/15/2019   Procedure: SCLEROTHERAPY;  Surgeon: Mansouraty, Telford Nab., MD;  Location: [facility];  Service: Gastroenterology;;   Social History   Occupational History   Not on file  Tobacco Use   Smoking status: Former    Types: Cigarettes    Start date: 11/18/2018    Quit date: 01/18/2019    Years since quitting: 2.9    Passive exposure: Never   Smokeless tobacco: Never   Tobacco comments:    pt stated got bored with it  Vaping Use   Vaping Use: Never used  Substance and Sexual Activity   Alcohol use: Not Currently    Comment: heavy drinker in the past, none since 07/19/18   Drug use: Not Currently   Sexual activity: Not Currently

## 2021-12-22 ENCOUNTER — Encounter: Payer: Medicare Other | Admitting: Orthopedic Surgery

## 2021-12-25 ENCOUNTER — Inpatient Hospital Stay (HOSPITAL_COMMUNITY)
Admission: EM | Admit: 2021-12-25 | Discharge: 2022-01-06 | DRG: 239 | Disposition: A | Discharge status: Rehabilitation Facility | Payer: Medicare Other | Attending: Internal Medicine | Admitting: Internal Medicine

## 2021-12-25 ENCOUNTER — Other Ambulatory Visit: Payer: Self-pay

## 2021-12-25 ENCOUNTER — Encounter (HOSPITAL_COMMUNITY): Payer: Self-pay

## 2021-12-25 ENCOUNTER — Emergency Department (HOSPITAL_COMMUNITY): Payer: Medicare Other

## 2021-12-25 DIAGNOSIS — Z87891 Personal history of nicotine dependence: Secondary | ICD-10-CM

## 2021-12-25 DIAGNOSIS — E1169 Type 2 diabetes mellitus with other specified complication: Secondary | ICD-10-CM | POA: Diagnosis present

## 2021-12-25 DIAGNOSIS — Z7902 Long term (current) use of antithrombotics/antiplatelets: Secondary | ICD-10-CM

## 2021-12-25 DIAGNOSIS — Z8673 Personal history of transient ischemic attack (TIA), and cerebral infarction without residual deficits: Secondary | ICD-10-CM

## 2021-12-25 DIAGNOSIS — N2581 Secondary hyperparathyroidism of renal origin: Secondary | ICD-10-CM | POA: Diagnosis present

## 2021-12-25 DIAGNOSIS — Z7982 Long term (current) use of aspirin: Secondary | ICD-10-CM

## 2021-12-25 DIAGNOSIS — F32A Depression, unspecified: Secondary | ICD-10-CM | POA: Diagnosis present

## 2021-12-25 DIAGNOSIS — E785 Hyperlipidemia, unspecified: Secondary | ICD-10-CM | POA: Diagnosis present

## 2021-12-25 DIAGNOSIS — K219 Gastro-esophageal reflux disease without esophagitis: Secondary | ICD-10-CM | POA: Diagnosis present

## 2021-12-25 DIAGNOSIS — N186 End stage renal disease: Secondary | ICD-10-CM | POA: Diagnosis present

## 2021-12-25 DIAGNOSIS — Z8616 Personal history of COVID-19: Secondary | ICD-10-CM

## 2021-12-25 DIAGNOSIS — N401 Enlarged prostate with lower urinary tract symptoms: Secondary | ICD-10-CM | POA: Diagnosis present

## 2021-12-25 DIAGNOSIS — E1152 Type 2 diabetes mellitus with diabetic peripheral angiopathy with gangrene: Principal | ICD-10-CM | POA: Diagnosis present

## 2021-12-25 DIAGNOSIS — M869 Osteomyelitis, unspecified: Secondary | ICD-10-CM | POA: Diagnosis present

## 2021-12-25 DIAGNOSIS — I120 Hypertensive chronic kidney disease with stage 5 chronic kidney disease or end stage renal disease: Secondary | ICD-10-CM | POA: Diagnosis present

## 2021-12-25 DIAGNOSIS — G4733 Obstructive sleep apnea (adult) (pediatric): Secondary | ICD-10-CM | POA: Diagnosis present

## 2021-12-25 DIAGNOSIS — E1122 Type 2 diabetes mellitus with diabetic chronic kidney disease: Secondary | ICD-10-CM | POA: Diagnosis present

## 2021-12-25 DIAGNOSIS — G546 Phantom limb syndrome with pain: Secondary | ICD-10-CM | POA: Diagnosis not present

## 2021-12-25 DIAGNOSIS — Z6832 Body mass index (BMI) 32.0-32.9, adult: Secondary | ICD-10-CM

## 2021-12-25 DIAGNOSIS — L089 Local infection of the skin and subcutaneous tissue, unspecified: Principal | ICD-10-CM | POA: Diagnosis present

## 2021-12-25 DIAGNOSIS — E11621 Type 2 diabetes mellitus with foot ulcer: Secondary | ICD-10-CM | POA: Diagnosis present

## 2021-12-25 DIAGNOSIS — E221 Hyperprolactinemia: Secondary | ICD-10-CM | POA: Diagnosis present

## 2021-12-25 DIAGNOSIS — M86171 Other acute osteomyelitis, right ankle and foot: Secondary | ICD-10-CM | POA: Diagnosis present

## 2021-12-25 DIAGNOSIS — Z79899 Other long term (current) drug therapy: Secondary | ICD-10-CM

## 2021-12-25 DIAGNOSIS — E669 Obesity, unspecified: Secondary | ICD-10-CM | POA: Diagnosis present

## 2021-12-25 DIAGNOSIS — D72828 Other elevated white blood cell count: Secondary | ICD-10-CM | POA: Diagnosis present

## 2021-12-25 DIAGNOSIS — R5381 Other malaise: Secondary | ICD-10-CM | POA: Diagnosis present

## 2021-12-25 DIAGNOSIS — L97419 Non-pressure chronic ulcer of right heel and midfoot with unspecified severity: Secondary | ICD-10-CM | POA: Diagnosis present

## 2021-12-25 DIAGNOSIS — Z89511 Acquired absence of right leg below knee: Secondary | ICD-10-CM

## 2021-12-25 DIAGNOSIS — I9589 Other hypotension: Secondary | ICD-10-CM | POA: Diagnosis present

## 2021-12-25 DIAGNOSIS — I10 Essential (primary) hypertension: Secondary | ICD-10-CM | POA: Diagnosis present

## 2021-12-25 DIAGNOSIS — D631 Anemia in chronic kidney disease: Secondary | ICD-10-CM | POA: Diagnosis present

## 2021-12-25 DIAGNOSIS — E1142 Type 2 diabetes mellitus with diabetic polyneuropathy: Secondary | ICD-10-CM | POA: Diagnosis present

## 2021-12-25 DIAGNOSIS — Z89512 Acquired absence of left leg below knee: Secondary | ICD-10-CM

## 2021-12-25 DIAGNOSIS — Z807 Family history of other malignant neoplasms of lymphoid, hematopoietic and related tissues: Secondary | ICD-10-CM

## 2021-12-25 DIAGNOSIS — Z833 Family history of diabetes mellitus: Secondary | ICD-10-CM

## 2021-12-25 DIAGNOSIS — Z992 Dependence on renal dialysis: Secondary | ICD-10-CM

## 2021-12-25 LAB — CBC WITH DIFFERENTIAL/PLATELET
Abs Immature Granulocytes: 0.48 10*3/uL — ABNORMAL HIGH (ref 0.00–0.07)
Basophils Absolute: 0.1 10*3/uL (ref 0.0–0.1)
Basophils Relative: 1 %
Eosinophils Absolute: 0.2 10*3/uL (ref 0.0–0.5)
Eosinophils Relative: 1 %
HCT: 40.6 % (ref 39.0–52.0)
Hemoglobin: 12.7 g/dL — ABNORMAL LOW (ref 13.0–17.0)
Immature Granulocytes: 4 %
Lymphocytes Relative: 10 %
Lymphs Abs: 1.4 10*3/uL (ref 0.7–4.0)
MCH: 34.2 pg — ABNORMAL HIGH (ref 26.0–34.0)
MCHC: 31.3 g/dL (ref 30.0–36.0)
MCV: 109.4 fL — ABNORMAL HIGH (ref 80.0–100.0)
Monocytes Absolute: 0.9 10*3/uL (ref 0.1–1.0)
Monocytes Relative: 7 %
Neutro Abs: 10.2 10*3/uL — ABNORMAL HIGH (ref 1.7–7.7)
Neutrophils Relative %: 77 %
Platelets: 343 10*3/uL (ref 150–400)
RBC: 3.71 MIL/uL — ABNORMAL LOW (ref 4.22–5.81)
RDW: 15.9 % — ABNORMAL HIGH (ref 11.5–15.5)
WBC: 13.2 10*3/uL — ABNORMAL HIGH (ref 4.0–10.5)
nRBC: 0 % (ref 0.0–0.2)

## 2021-12-25 LAB — COMPREHENSIVE METABOLIC PANEL
ALT: 86 U/L — ABNORMAL HIGH (ref 0–44)
AST: 42 U/L — ABNORMAL HIGH (ref 15–41)
Albumin: 3.2 g/dL — ABNORMAL LOW (ref 3.5–5.0)
Alkaline Phosphatase: 212 U/L — ABNORMAL HIGH (ref 38–126)
Anion gap: 19 — ABNORMAL HIGH (ref 5–15)
BUN: 56 mg/dL — ABNORMAL HIGH (ref 6–20)
CO2: 28 mmol/L (ref 22–32)
Calcium: 9.7 mg/dL (ref 8.9–10.3)
Chloride: 88 mmol/L — ABNORMAL LOW (ref 98–111)
Creatinine, Ser: 7.5 mg/dL — ABNORMAL HIGH (ref 0.61–1.24)
GFR, Estimated: 8 mL/min — ABNORMAL LOW (ref >60)
Glucose, Bld: 153 mg/dL — ABNORMAL HIGH (ref 70–99)
Potassium: 4.3 mmol/L (ref 3.5–5.1)
Sodium: 135 mmol/L (ref 135–145)
Total Bilirubin: 1.3 mg/dL — ABNORMAL HIGH (ref 0.3–1.2)
Total Protein: 7.8 g/dL (ref 6.5–8.1)

## 2021-12-25 LAB — LACTIC ACID, PLASMA: Lactic Acid, Venous: 1.3 mmol/L (ref 0.5–1.9)

## 2021-12-25 IMAGING — CR DG ANKLE COMPLETE 3+V*R*
3 series · 3 of 3 positions shown · non-contrast
Comparison: [DATE]

CLINICAL DATA: Soft tissue wound along the plantar aspect of the
right foot with decreased healing, initial encounter

EXAM:
RIGHT ANKLE - COMPLETE 3+ VIEW

[ankle ap]
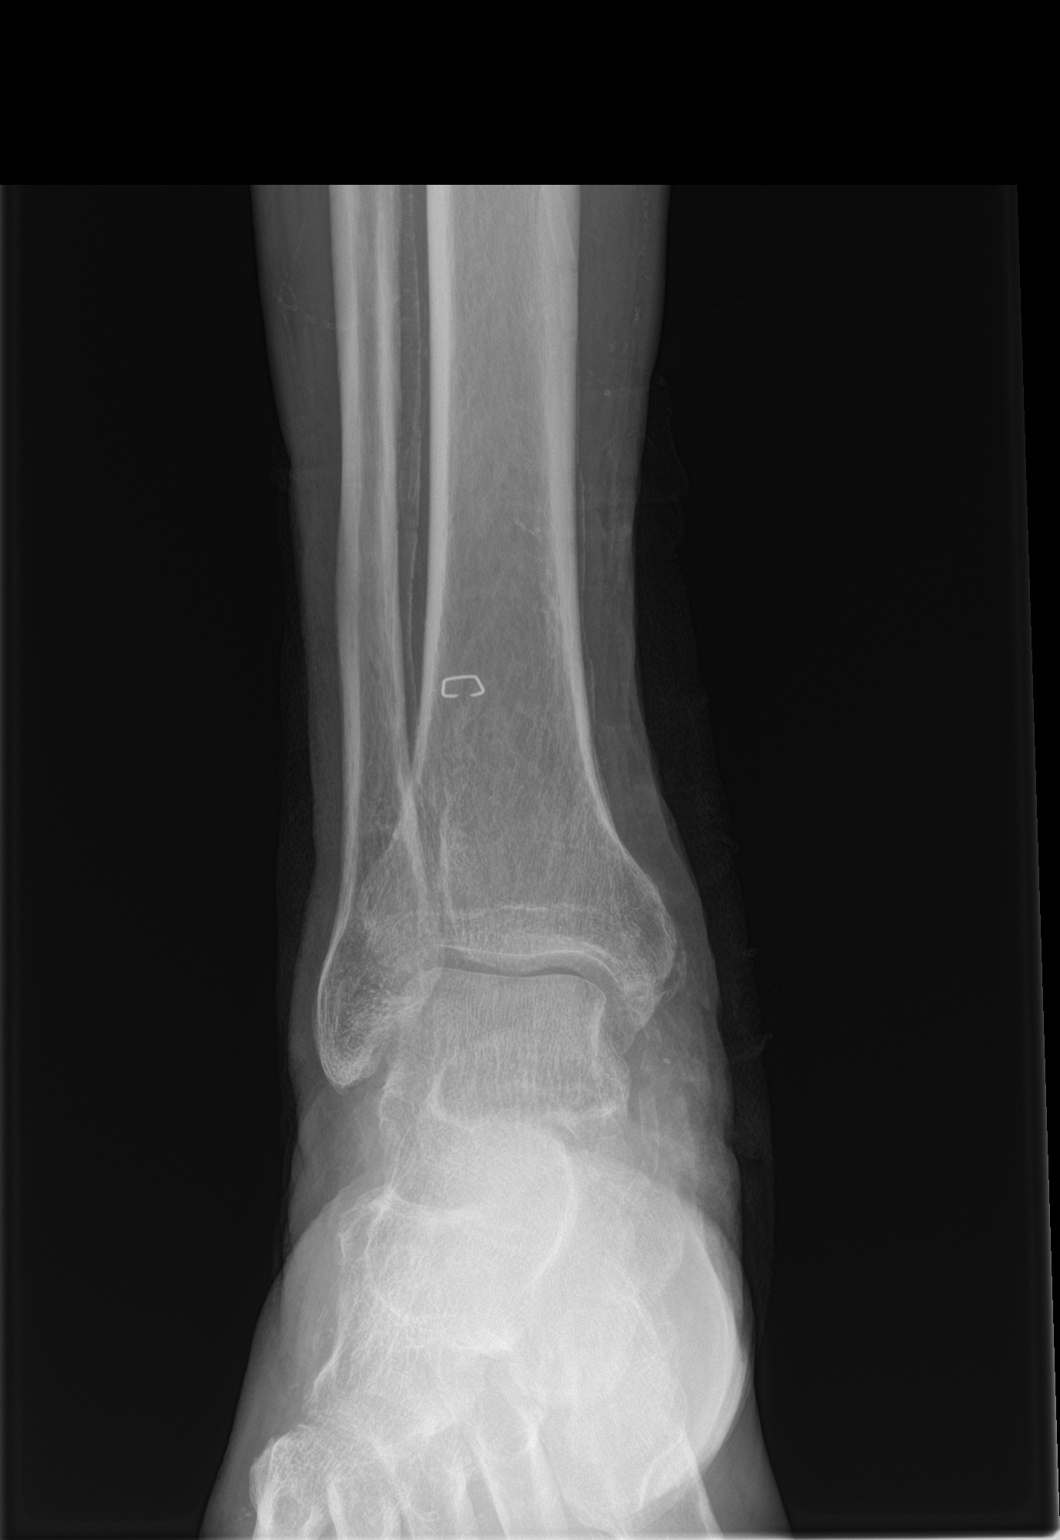

[ankle obl]
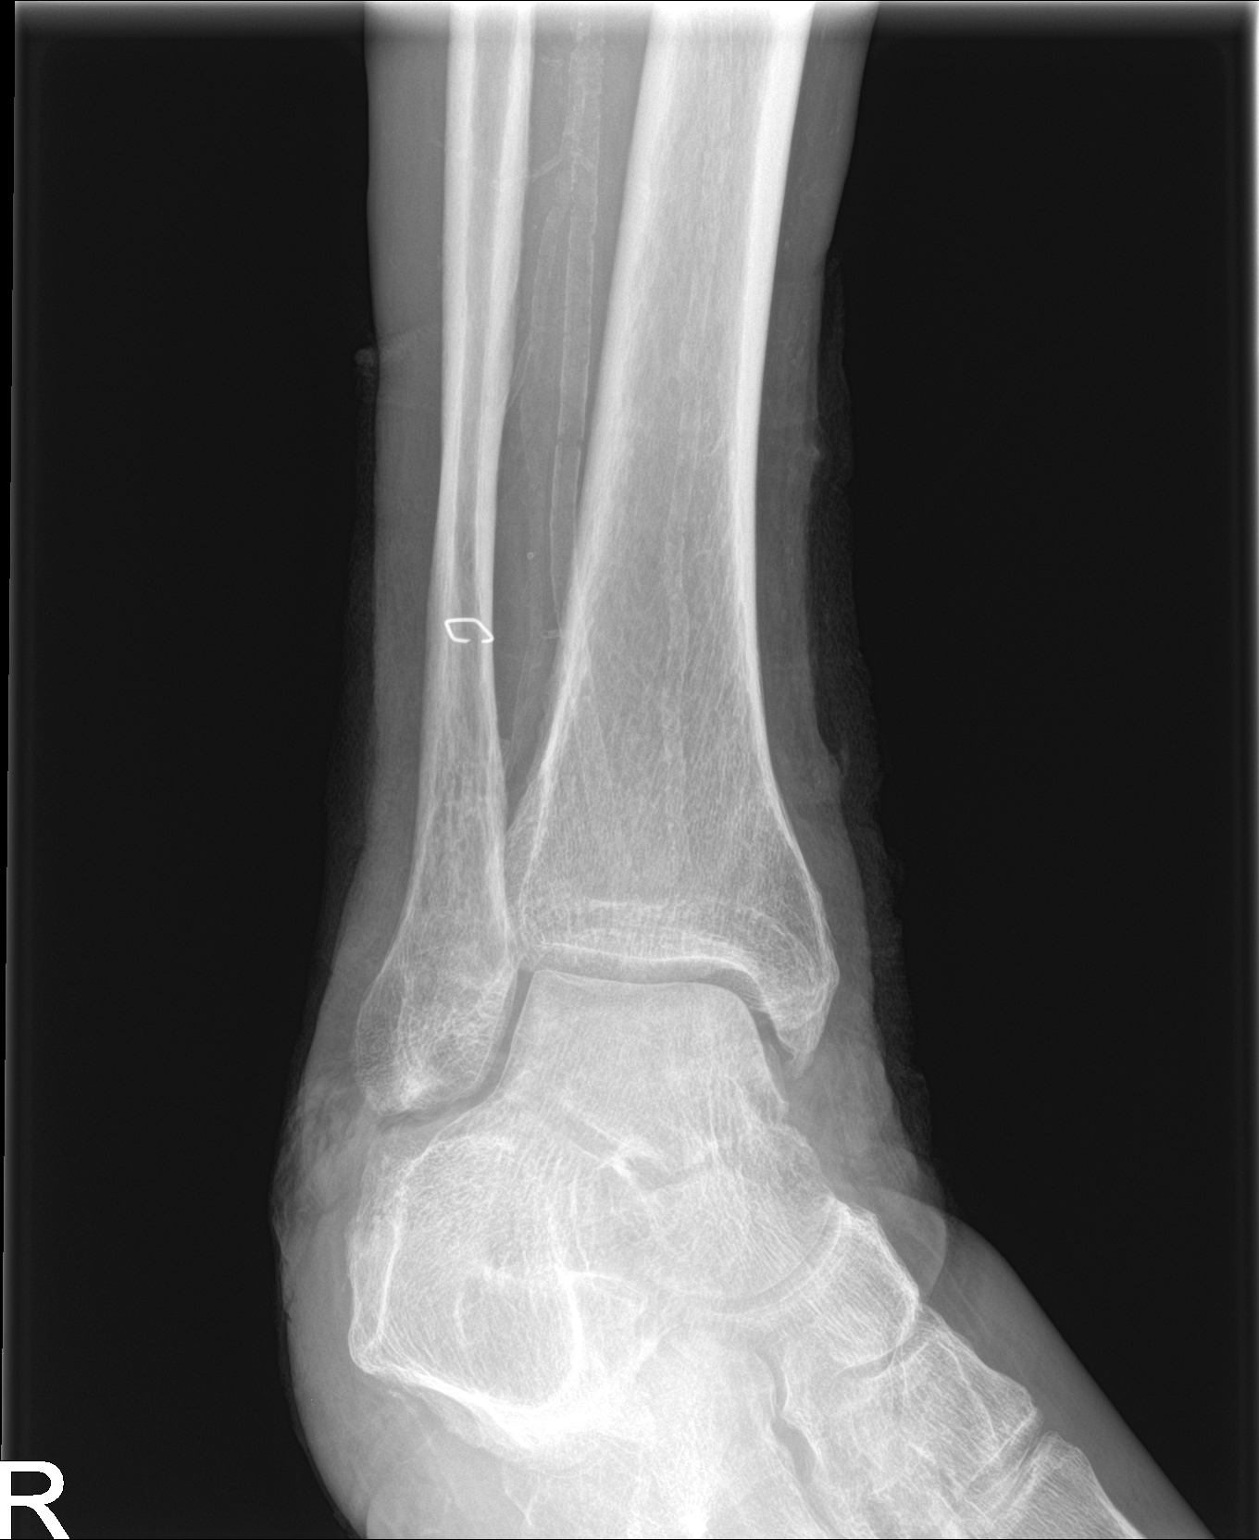

[ankle lat]
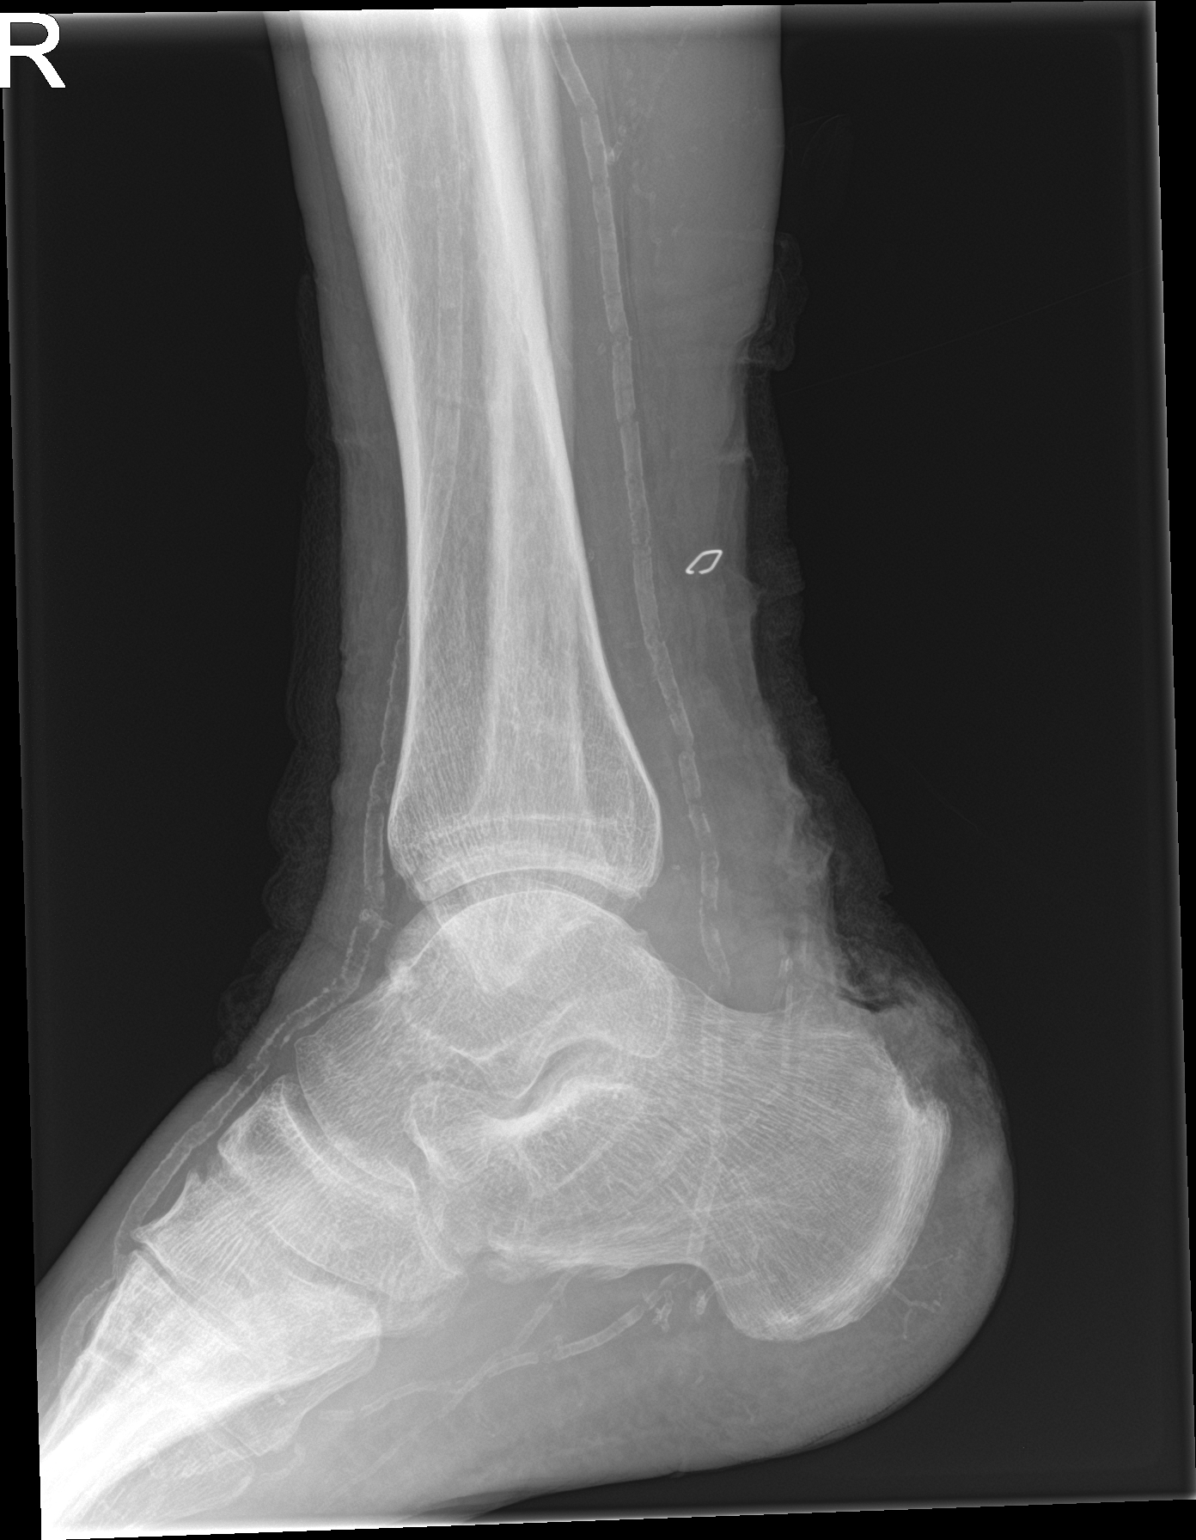

[3 of 3 positions shown; findings below may reference images not displayed]

FINDINGS: Previously seen lucency within the distal tibia is no longer
identified. Soft tissue wound is noted along the superior aspect of
the calcaneus near the Achilles insertion. This is new from the
prior exam. There are rows of changes in this region consistent with
osteomyelitis. Tarsal degenerative changes are noted.
IMPRESSION: New nonhealing wound along the posterior aspect of the ankle with
erosive changes along the posterosuperior calcaneus consistent with
osteomyelitis.

## 2021-12-25 NOTE — ED Triage Notes (Signed)
Wound to bottom of right foot that had surgery on 1 month ago that is not getting better.

## 2021-12-25 NOTE — ED Provider Triage Note (Signed)
Emergency Medicine Provider Triage Evaluation Note  Michael Riggs , a 51 y.o. male  was evaluated in triage.  Pt complains of right heel infection.  Has a chronic wound there which has been having over the last month, started draining worse today.  He was at home, followed by Dr. Sharol Given with orthopedics..  Review of Systems  Per HPI  Physical Exam  There were no vitals taken for this visit. Gen:   Awake, no distress   Resp:  Normal effort  MSK:   Moves extremities without difficulty  Other:  Left lower limb.  Right lower extremity with draining wound, DP and PT 1+ but palpable.  Cap refill 3 seconds.  Medical Decision Making  Medically screening exam initiated at 6:51 PM.  Appropriate orders placed.  Michael Riggs was informed that the remainder of the evaluation will be completed by another provider, this initial triage assessment does not replace that evaluation, and the importance of remaining in the ED until their evaluation is complete.    Sherrill Raring, [place] 12/25/21 2637

## 2021-12-26 ENCOUNTER — Encounter (HOSPITAL_COMMUNITY): Payer: Self-pay | Admitting: Internal Medicine

## 2021-12-26 DIAGNOSIS — I10 Essential (primary) hypertension: Secondary | ICD-10-CM

## 2021-12-26 DIAGNOSIS — E1151 Type 2 diabetes mellitus with diabetic peripheral angiopathy without gangrene: Secondary | ICD-10-CM | POA: Diagnosis not present

## 2021-12-26 DIAGNOSIS — M869 Osteomyelitis, unspecified: Secondary | ICD-10-CM | POA: Diagnosis present

## 2021-12-26 DIAGNOSIS — S88111A Complete traumatic amputation at level between knee and ankle, right lower leg, initial encounter: Secondary | ICD-10-CM | POA: Diagnosis not present

## 2021-12-26 DIAGNOSIS — L97419 Non-pressure chronic ulcer of right heel and midfoot with unspecified severity: Secondary | ICD-10-CM | POA: Diagnosis present

## 2021-12-26 DIAGNOSIS — E1122 Type 2 diabetes mellitus with diabetic chronic kidney disease: Secondary | ICD-10-CM | POA: Diagnosis present

## 2021-12-26 DIAGNOSIS — M86171 Other acute osteomyelitis, right ankle and foot: Secondary | ICD-10-CM | POA: Diagnosis present

## 2021-12-26 DIAGNOSIS — G4733 Obstructive sleep apnea (adult) (pediatric): Secondary | ICD-10-CM | POA: Diagnosis present

## 2021-12-26 DIAGNOSIS — Z992 Dependence on renal dialysis: Secondary | ICD-10-CM | POA: Diagnosis not present

## 2021-12-26 DIAGNOSIS — E221 Hyperprolactinemia: Secondary | ICD-10-CM | POA: Diagnosis present

## 2021-12-26 DIAGNOSIS — I120 Hypertensive chronic kidney disease with stage 5 chronic kidney disease or end stage renal disease: Secondary | ICD-10-CM | POA: Diagnosis not present

## 2021-12-26 DIAGNOSIS — N401 Enlarged prostate with lower urinary tract symptoms: Secondary | ICD-10-CM | POA: Diagnosis present

## 2021-12-26 DIAGNOSIS — E1142 Type 2 diabetes mellitus with diabetic polyneuropathy: Secondary | ICD-10-CM | POA: Diagnosis present

## 2021-12-26 DIAGNOSIS — L089 Local infection of the skin and subcutaneous tissue, unspecified: Secondary | ICD-10-CM | POA: Diagnosis not present

## 2021-12-26 DIAGNOSIS — N186 End stage renal disease: Secondary | ICD-10-CM | POA: Diagnosis present

## 2021-12-26 DIAGNOSIS — T148XXA Other injury of unspecified body region, initial encounter: Secondary | ICD-10-CM | POA: Diagnosis not present

## 2021-12-26 DIAGNOSIS — E669 Obesity, unspecified: Secondary | ICD-10-CM | POA: Diagnosis present

## 2021-12-26 DIAGNOSIS — F32A Depression, unspecified: Secondary | ICD-10-CM | POA: Diagnosis present

## 2021-12-26 DIAGNOSIS — E785 Hyperlipidemia, unspecified: Secondary | ICD-10-CM | POA: Diagnosis present

## 2021-12-26 DIAGNOSIS — Z7902 Long term (current) use of antithrombotics/antiplatelets: Secondary | ICD-10-CM | POA: Diagnosis not present

## 2021-12-26 DIAGNOSIS — Z807 Family history of other malignant neoplasms of lymphoid, hematopoietic and related tissues: Secondary | ICD-10-CM | POA: Diagnosis not present

## 2021-12-26 DIAGNOSIS — E1152 Type 2 diabetes mellitus with diabetic peripheral angiopathy with gangrene: Secondary | ICD-10-CM | POA: Diagnosis present

## 2021-12-26 DIAGNOSIS — G546 Phantom limb syndrome with pain: Secondary | ICD-10-CM | POA: Diagnosis not present

## 2021-12-26 DIAGNOSIS — G8918 Other acute postprocedural pain: Secondary | ICD-10-CM | POA: Diagnosis not present

## 2021-12-26 DIAGNOSIS — I9589 Other hypotension: Secondary | ICD-10-CM | POA: Diagnosis present

## 2021-12-26 DIAGNOSIS — D631 Anemia in chronic kidney disease: Secondary | ICD-10-CM | POA: Diagnosis present

## 2021-12-26 DIAGNOSIS — N2581 Secondary hyperparathyroidism of renal origin: Secondary | ICD-10-CM | POA: Diagnosis present

## 2021-12-26 DIAGNOSIS — Z4781 Encounter for orthopedic aftercare following surgical amputation: Secondary | ICD-10-CM | POA: Diagnosis not present

## 2021-12-26 DIAGNOSIS — K920 Hematemesis: Secondary | ICD-10-CM | POA: Diagnosis not present

## 2021-12-26 DIAGNOSIS — G894 Chronic pain syndrome: Secondary | ICD-10-CM | POA: Diagnosis not present

## 2021-12-26 DIAGNOSIS — E11621 Type 2 diabetes mellitus with foot ulcer: Secondary | ICD-10-CM | POA: Diagnosis present

## 2021-12-26 DIAGNOSIS — Z79899 Other long term (current) drug therapy: Secondary | ICD-10-CM | POA: Diagnosis not present

## 2021-12-26 DIAGNOSIS — Z8616 Personal history of COVID-19: Secondary | ICD-10-CM | POA: Diagnosis not present

## 2021-12-26 DIAGNOSIS — E1169 Type 2 diabetes mellitus with other specified complication: Secondary | ICD-10-CM | POA: Diagnosis present

## 2021-12-26 LAB — BASIC METABOLIC PANEL
Anion gap: 15 (ref 5–15)
BUN: 62 mg/dL — ABNORMAL HIGH (ref 6–20)
CO2: 27 mmol/L (ref 22–32)
Calcium: 9.3 mg/dL (ref 8.9–10.3)
Chloride: 90 mmol/L — ABNORMAL LOW (ref 98–111)
Creatinine, Ser: 8.02 mg/dL — ABNORMAL HIGH (ref 0.61–1.24)
GFR, Estimated: 8 mL/min — ABNORMAL LOW (ref >60)
Glucose, Bld: 133 mg/dL — ABNORMAL HIGH (ref 70–99)
Potassium: 5.1 mmol/L (ref 3.5–5.1)
Sodium: 132 mmol/L — ABNORMAL LOW (ref 135–145)

## 2021-12-26 LAB — CBC WITH DIFFERENTIAL/PLATELET
Abs Immature Granulocytes: 0.47 10*3/uL — ABNORMAL HIGH (ref 0.00–0.07)
Basophils Absolute: 0.1 10*3/uL (ref 0.0–0.1)
Basophils Relative: 1 %
Eosinophils Absolute: 0.3 10*3/uL (ref 0.0–0.5)
Eosinophils Relative: 2 %
HCT: 38.8 % — ABNORMAL LOW (ref 39.0–52.0)
Hemoglobin: 12.7 g/dL — ABNORMAL LOW (ref 13.0–17.0)
Immature Granulocytes: 3 %
Lymphocytes Relative: 12 %
Lymphs Abs: 1.8 10*3/uL (ref 0.7–4.0)
MCH: 35.4 pg — ABNORMAL HIGH (ref 26.0–34.0)
MCHC: 32.7 g/dL (ref 30.0–36.0)
MCV: 108.1 fL — ABNORMAL HIGH (ref 80.0–100.0)
Monocytes Absolute: 1.1 10*3/uL — ABNORMAL HIGH (ref 0.1–1.0)
Monocytes Relative: 7 %
Neutro Abs: 11.7 10*3/uL — ABNORMAL HIGH (ref 1.7–7.7)
Neutrophils Relative %: 75 %
Platelets: 329 10*3/uL (ref 150–400)
RBC: 3.59 MIL/uL — ABNORMAL LOW (ref 4.22–5.81)
RDW: 15.9 % — ABNORMAL HIGH (ref 11.5–15.5)
WBC: 15.5 10*3/uL — ABNORMAL HIGH (ref 4.0–10.5)
nRBC: 0 % (ref 0.0–0.2)

## 2021-12-26 LAB — BLOOD CULTURE ID PANEL (REFLEXED) - BCID2

## 2021-12-26 LAB — HEPATITIS C ANTIBODY: HCV Ab: NONREACTIVE

## 2021-12-26 LAB — HEPATITIS B SURFACE ANTIBODY,QUALITATIVE: Hep B S Ab: NONREACTIVE

## 2021-12-26 LAB — HEPATITIS B SURFACE ANTIGEN: Hepatitis B Surface Ag: NONREACTIVE

## 2021-12-26 LAB — SEDIMENTATION RATE: Sed Rate: 86 mm/hr — ABNORMAL HIGH (ref 0–16)

## 2021-12-26 LAB — HEPATITIS B CORE ANTIBODY, TOTAL: Hep B Core Total Ab: NONREACTIVE

## 2021-12-26 MED ORDER — ACETAMINOPHEN 325 MG PO TABS
650.0000 mg | ORAL_TABLET | Freq: Four times a day (QID) | ORAL | Status: DC | PRN
  Administered 2021-12-26 – 2021-12-29 (×3): 650 mg via ORAL
  Filled 2021-12-26 (×3): qty 2

## 2021-12-26 MED ORDER — OXYCODONE HCL 5 MG PO TABS
5.0000 mg | ORAL_TABLET | Freq: Four times a day (QID) | ORAL | Status: DC | PRN
  Administered 2021-12-26: 5 mg via ORAL
  Filled 2021-12-26 (×2): qty 1

## 2021-12-26 MED ORDER — ACETAMINOPHEN 650 MG RE SUPP
650.0000 mg | Freq: Four times a day (QID) | RECTAL | Status: DC | PRN
Start: 2021-12-26 — End: 2022-01-02

## 2021-12-26 MED ORDER — AMLODIPINE BESYLATE 5 MG PO TABS
5.0000 mg | ORAL_TABLET | Freq: Every day | ORAL | Status: DC
  Administered 2021-12-26 – 2021-12-30 (×5): 5 mg via ORAL
  Filled 2021-12-26 (×5): qty 1

## 2021-12-26 MED ORDER — IPRATROPIUM-ALBUTEROL 0.5-2.5 (3) MG/3ML IN SOLN: 3.0000 mL | Freq: Four times a day (QID) | RESPIRATORY_TRACT | Status: DC | PRN

## 2021-12-26 MED ORDER — ACETAMINOPHEN 500 MG PO TABS
1000.0000 mg | ORAL_TABLET | Freq: Once | ORAL | Status: AC
  Administered 2021-12-26: 1000 mg via ORAL
  Filled 2021-12-26: qty 2

## 2021-12-26 MED ORDER — ALTEPLASE 2 MG IJ SOLR: 2.0000 mg | Freq: Once | INTRAMUSCULAR | Status: DC | PRN

## 2021-12-26 MED ORDER — ATORVASTATIN CALCIUM 40 MG PO TABS
40.0000 mg | ORAL_TABLET | Freq: Every day | ORAL | Status: DC
  Administered 2021-12-26 – 2022-01-05 (×11): 40 mg via ORAL
  Filled 2021-12-26 (×11): qty 1

## 2021-12-26 MED ORDER — TAMSULOSIN HCL 0.4 MG PO CAPS
0.4000 mg | ORAL_CAPSULE | Freq: Every day | ORAL | Status: DC
  Administered 2021-12-26 – 2022-01-05 (×11): 0.4 mg via ORAL
  Filled 2021-12-26 (×11): qty 1

## 2021-12-26 MED ORDER — MELATONIN 5 MG PO TABS
10.0000 mg | ORAL_TABLET | Freq: Every day | ORAL | Status: DC
  Administered 2021-12-26 – 2022-01-05 (×11): 10 mg via ORAL
  Filled 2021-12-26 (×11): qty 2

## 2021-12-26 MED ORDER — SODIUM CHLORIDE 0.9 % IV SOLN
1.0000 g | INTRAVENOUS | Status: DC
  Administered 2021-12-26 – 2022-01-04 (×10): 1 g via INTRAVENOUS
  Filled 2021-12-26 (×12): qty 10

## 2021-12-26 MED ORDER — BROMOCRIPTINE MESYLATE 2.5 MG PO TABS
2.5000 mg | ORAL_TABLET | Freq: Every day | ORAL | Status: DC
  Administered 2021-12-26 – 2022-01-05 (×11): 2.5 mg via ORAL
  Filled 2021-12-26 (×13): qty 1

## 2021-12-26 MED ORDER — HYDROXYZINE HCL 10 MG PO TABS
10.0000 mg | ORAL_TABLET | Freq: Three times a day (TID) | ORAL | Status: DC | PRN
Start: 2021-12-26 — End: 2022-01-06
  Administered 2021-12-26 – 2022-01-04 (×6): 10 mg via ORAL
  Filled 2021-12-26 (×6): qty 1

## 2021-12-26 MED ORDER — HYDROMORPHONE HCL 2 MG PO TABS
4.0000 mg | ORAL_TABLET | Freq: Three times a day (TID) | ORAL | Status: DC | PRN
  Administered 2021-12-26 – 2022-01-06 (×19): 4 mg via ORAL
  Filled 2021-12-26 (×19): qty 2

## 2021-12-26 MED ORDER — ASPIRIN 81 MG PO TBEC
81.0000 mg | DELAYED_RELEASE_TABLET | Freq: Every day | ORAL | Status: DC
  Administered 2021-12-26 – 2022-01-06 (×12): 81 mg via ORAL
  Filled 2021-12-26 (×12): qty 1

## 2021-12-26 MED ORDER — CHLORHEXIDINE GLUCONATE CLOTH 2 % EX PADS
6.0000 | MEDICATED_PAD | Freq: Every day | CUTANEOUS | Status: DC
  Administered 2021-12-27 – 2022-01-04 (×8): 6 via TOPICAL

## 2021-12-26 MED ORDER — LIDOCAINE HCL (PF) 1 % IJ SOLN: 5.0000 mL | INTRAMUSCULAR | Status: DC | PRN

## 2021-12-26 MED ORDER — BUPROPION HCL ER (XL) 300 MG PO TB24
300.0000 mg | ORAL_TABLET | Freq: Every morning | ORAL | Status: DC
  Filled 2021-12-26: qty 1

## 2021-12-26 MED ORDER — PENTAFLUOROPROP-TETRAFLUOROETH EX AERO
1.0000 | INHALATION_SPRAY | CUTANEOUS | Status: DC | PRN
Start: 2021-12-26 — End: 2021-12-26

## 2021-12-26 MED ORDER — LIDOCAINE-PRILOCAINE 2.5-2.5 % EX CREA: 1.0000 "application " | TOPICAL_CREAM | CUTANEOUS | Status: DC | PRN

## 2021-12-26 MED ORDER — METOPROLOL TARTRATE 25 MG PO TABS
25.0000 mg | ORAL_TABLET | Freq: Two times a day (BID) | ORAL | Status: DC
  Administered 2021-12-26 – 2022-01-06 (×15): 25 mg via ORAL
  Filled 2021-12-26 (×19): qty 1

## 2021-12-26 MED ORDER — HEPARIN SODIUM (PORCINE) 5000 UNIT/ML IJ SOLN
5000.0000 [IU] | Freq: Three times a day (TID) | INTRAMUSCULAR | Status: DC
  Administered 2021-12-26 – 2022-01-06 (×33): 5000 [IU] via SUBCUTANEOUS
  Filled 2021-12-26 (×33): qty 1

## 2021-12-26 MED ORDER — ALBUTEROL SULFATE (2.5 MG/3ML) 0.083% IN NEBU: 3.0000 mL | INHALATION_SOLUTION | Freq: Four times a day (QID) | RESPIRATORY_TRACT | Status: DC | PRN

## 2021-12-26 MED ORDER — MIDODRINE HCL 5 MG PO TABS
10.0000 mg | ORAL_TABLET | ORAL | Status: DC
  Administered 2021-12-26 – 2022-01-05 (×6): 10 mg via ORAL
  Filled 2021-12-26 (×10): qty 2

## 2021-12-26 MED ORDER — PANTOPRAZOLE SODIUM 40 MG PO TBEC
40.0000 mg | DELAYED_RELEASE_TABLET | Freq: Every day | ORAL | Status: DC
  Administered 2021-12-26 – 2022-01-01 (×7): 40 mg via ORAL
  Filled 2021-12-26 (×7): qty 1

## 2021-12-26 MED ORDER — CALCIUM ACETATE (PHOS BINDER) 667 MG PO CAPS
1334.0000 mg | ORAL_CAPSULE | Freq: Three times a day (TID) | ORAL | Status: DC
  Administered 2021-12-26 (×2): 1334 mg via ORAL
  Filled 2021-12-26 (×3): qty 2

## 2021-12-26 MED ORDER — BUPROPION HCL ER (XL) 150 MG PO TB24
300.0000 mg | ORAL_TABLET | Freq: Every morning | ORAL | Status: DC
  Administered 2021-12-26 – 2022-01-06 (×12): 300 mg via ORAL
  Filled 2021-12-26 (×6): qty 2
  Filled 2021-12-26: qty 1
  Filled 2021-12-26 (×6): qty 2

## 2021-12-26 MED ORDER — HEPARIN SODIUM (PORCINE) 1000 UNIT/ML DIALYSIS: 1000.0000 [IU] | INTRAMUSCULAR | Status: DC | PRN

## 2021-12-26 MED ORDER — DULOXETINE HCL 20 MG PO CPEP
20.0000 mg | ORAL_CAPSULE | Freq: Two times a day (BID) | ORAL | Status: DC
  Administered 2021-12-26 – 2022-01-06 (×21): 20 mg via ORAL
  Filled 2021-12-26 (×26): qty 1

## 2021-12-26 MED ORDER — CALCITRIOL 0.5 MCG PO CAPS
2.0000 ug | ORAL_CAPSULE | ORAL | Status: DC
  Administered 2021-12-26: 2 ug via ORAL
  Filled 2021-12-26 (×2): qty 4

## 2021-12-26 NOTE — ED Notes (Signed)
Pt sitting up on side of bed with breakfast tray in reach. Pt states he do not need anything from this tech at this time. Call bell is placed in reach.

## 2021-12-26 NOTE — Progress Notes (Signed)
PHARMACY - PHYSICIAN COMMUNICATION CRITICAL VALUE ALERT - BLOOD CULTURE IDENTIFICATION (BCID)  Michael Riggs is an 51 y.o. male who presented to [facility] on 12/25/2021 with a chief complaint of increased drainage from R ankle wound. Patient diagnosed with osteomyelitis of R foot with chronic nonhealing wound of the right ankle and is being evaluated by orthopedic surgery.   Assessment:  6/9 Blood culture: 1/4 bottles Enterobacterales (aerobic) - no species identified  Name of physician (or Provider) Contacted: N/A  Current antibiotics: Cefepime 1g IV q24h (after HD on dialysis days)  Changes to prescribed antibiotics recommended:  Patient is on recommended antibiotics - No changes needed  Results for orders placed or performed during the hospital encounter of 10/06/21  Blood Culture ID Panel (Reflexed) (Collected: 10/06/2021 12:15 PM)  Result Value Ref Range   Enterococcus faecalis NOT DETECTED NOT DETECTED   Enterococcus Faecium NOT DETECTED NOT DETECTED   Listeria monocytogenes NOT DETECTED NOT DETECTED   Staphylococcus species NOT DETECTED NOT DETECTED   Staphylococcus aureus (BCID) NOT DETECTED NOT DETECTED   Staphylococcus epidermidis NOT DETECTED NOT DETECTED   Staphylococcus lugdunensis NOT DETECTED NOT DETECTED   Streptococcus species DETECTED (A) NOT DETECTED   Streptococcus agalactiae NOT DETECTED NOT DETECTED   Streptococcus pneumoniae NOT DETECTED NOT DETECTED   Streptococcus pyogenes NOT DETECTED NOT DETECTED   A.calcoaceticus-baumannii NOT DETECTED NOT DETECTED   Bacteroides fragilis NOT DETECTED NOT DETECTED   Enterobacterales NOT DETECTED NOT DETECTED   Enterobacter cloacae complex NOT DETECTED NOT DETECTED   Escherichia coli NOT DETECTED NOT DETECTED   Klebsiella aerogenes NOT DETECTED NOT DETECTED   Klebsiella oxytoca NOT DETECTED NOT DETECTED   Klebsiella pneumoniae NOT DETECTED NOT DETECTED   Proteus species NOT DETECTED NOT DETECTED   Salmonella  species NOT DETECTED NOT DETECTED   Serratia marcescens NOT DETECTED NOT DETECTED   Haemophilus influenzae NOT DETECTED NOT DETECTED   Neisseria meningitidis NOT DETECTED NOT DETECTED   Pseudomonas aeruginosa NOT DETECTED NOT DETECTED   Stenotrophomonas maltophilia NOT DETECTED NOT DETECTED   Candida albicans NOT DETECTED NOT DETECTED   Candida auris NOT DETECTED NOT DETECTED   Candida glabrata NOT DETECTED NOT DETECTED   Candida krusei NOT DETECTED NOT DETECTED   Candida parapsilosis NOT DETECTED NOT DETECTED   Candida tropicalis NOT DETECTED NOT DETECTED   Cryptococcus neoformans/gattii NOT DETECTED NOT DETECTED    Kaleen Mask 12/26/2021  8:27 PM

## 2021-12-26 NOTE — Progress Notes (Signed)
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 Sharol Given following That hospitalization was complicated by near syncope 13 mm infarct central central pons-neurology over read the studies and felt that this was more in keeping with prior infarcts and recommended DAPT  Patient was subsequently followed with Dr. Sharol Given of orthopedics and patient was told to return on 6/8 Patient instead returned to the emergency room and found to have breakdown of the calcaneus posterolaterally Found to have osteomyelitis of the right calcaneal area Antibiotics were withheld  O/e BP 109/80 (BP Location: Right Arm)   Pulse 89   Temp 97.6 F (36.4 C) (Oral)   Resp 18   Ht 5\' 10"  (1.778 m)   Wt 102.1 kg   SpO2 100%   BMI 32.28 kg/m  Patient is sitting up trying to eat--states has discomfort in RLE Wants binders [none on MAR] States the wound broke down ~ 4 days ago and started to have a smell.   Alert coherent in nad no focal deficit Thick neck, + acanthosis S1 S2 no m/r/g Abd obese nt nd no rebound no guard Scabbing and wound s to LE's WOund not examined as covered --Pulses felt on RLE  A/P Likely osteo and wound breakdown--will cover  with Flagyl/Cefepime  Dr Sharol Given aware of patient--may need surgery in am--monitor Nephro aware as is M/W/F HD patient--Binders started

## 2021-12-26 NOTE — Progress Notes (Signed)
Received patient in bed, alert and oriented. Informed consent signed and in chart.  Time tx initiated:1514  Pre HD weight:101.7  Pre HD VS:111/85,69,10,100  Time tx completed:  HD treatment completed. Patient tolerated well. Fistula/Graft/HD catheter without signs and symptoms of complications. Patient transported back to the room, alert and orient and in no acute distress. Report given to bedside RN.  Total UF removed:2430  Medication given:  Post HD VS:134/50,78,  Post HD weight:

## 2021-12-26 NOTE — Consult Note (Signed)
[facility] Renal Consultation Note    Indication for Consultation:  Management of ESRD/hemodialysis; anemia, hypertension/volume and secondary hyperparathyroidism  PCP:Lee, Joylene Igo, MD  HPI: Michael Riggs is a 51 y.o. male with ESRD on HD MWF at [facility]. 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 Patient presents to the ED with concern for drainage of R ankle wound. X-ray R ankle concern for osteo. Noted previous hospitalization in March 2023 for infected RLE wound (+) serratia staph aureus. He underwent R achilles debridement and kerecis graft on 10/10/2021. He is followed by Dr. Sharol Given outpatient. His last HD was on 6/7-noted he only completed 3 hours. Noted outpatient ABX recently switched to Cefepime through 01/02/22. Seen and examined at bedside. He is c/o RLE pain and mild SOB. Denies CP, ABD pain, and N/V. He is euvolemic on exam and BP acceptable. Plan for HD today.  Past Medical History:  Diagnosis Date   Anemia    Anemia of chronic disease    Anxiety    ARF (acute renal failure) ([place]) 01/30/2019   Calciphylaxis    Controlled type 2 diabetes mellitus with hyperglycemia, without long-term current use of insulin ([place])    Debility 02/09/2019   Depression    Diabetes mellitus type 2 in obese ([place]) 07/04/2013   Diabetes mellitus with peripheral vascular disease ([place])    type 2 no meds, lost 100 lbs   Dyslipidemia    Dyspnea    inhaler   End stage renal disease ([place])    ESRD (end stage renal disease) on dialysis ([place]) 01/2019   MWFS   ESRD on dialysis [facility])    Essential hypertension    Fluid overload 02/04/2019   GERD (gastroesophageal reflux disease)    Goals of care, counseling/discussion    Habitual alcohol use 07/04/2013   Headache     Hyperkalemia 01/30/2019   Hypertension    Hypoglycemia 01/30/2019   Hypokalemia 01/30/2019   Labile blood pressure    Leukocytosis    Lower GI bleed 05/14/2019   Macrocytic anemia 01/30/2019   Obesity, Class II, BMI 35-39.9, with comorbidity 07/04/2013   Palliative care encounter    PDR (proliferative diabetic retinopathy) ([place]) 12/14/2013   Physical deconditioning    Pressure injury of skin 04/27/2019   Pruritus    Scrotal edema    Scrotal pain    Sleep apnea    uses CPAP   Sleep disturbance    Slow transit constipation    Status post below-knee amputation of left lower extremity ([place])    Stroke ([place])    mini -shown on CT scan   Syphilis 01/2019   Urinary retention    Venous hypertension 09/22/2013   Wound infection 03/28/2019   Past Surgical History:  Procedure Laterality Date   ABDOMINAL AORTOGRAM W/LOWER EXTREMITY N/A 10/15/2021   Procedure: ABDOMINAL AORTOGRAM W/LOWER EXTREMITY;  Surgeon: Broadus John, MD;  Location: [place] [facility];  Service: Cardiovascular;  Laterality: N/A;   AV FISTULA PLACEMENT Left 01/09/2019   Procedure: ARTERIOVENOUS (AV) FISTULA CREATION LEFT UPPER ARM;  Surgeon: [name], MD;  Location: [facility];  Service: Vascular;  Laterality: Left;   [place] Left 07/06/2019   Procedure: BASILIC VEIN TRANSPOSITION SECOND STAGE LEFT ARM;  Surgeon: [name], MD;  Location: [place];  Service:  Vascular;  Laterality: Left;   below the knee amputation Left    EYE SURGERY Bilateral    FLEXIBLE SIGMOIDOSCOPY N/A 05/15/2019   Procedure: FLEXIBLE SIGMOIDOSCOPY;  Surgeon: Rush Landmark Telford Nab., MD;  Location: [place];  Service: Gastroenterology;  Laterality: N/A;   HEMOSTASIS CLIP PLACEMENT  05/15/2019   Procedure: HEMOSTASIS CLIP PLACEMENT;  Surgeon: Irving Copas., MD;  Location: [place];  Service: Gastroenterology;;   HOT HEMOSTASIS N/A 05/15/2019   Procedure: HOT HEMOSTASIS (ARGON PLASMA COAGULATION/BICAP);  Surgeon:  Irving Copas., MD;  Location: [place];  Service: Gastroenterology;  Laterality: N/A;   I & D EXTREMITY Right 10/10/2021   Procedure: RIGHT ACHILLES DEBRIDEMENT AND TISSUE GRAFT;  Surgeon: [name], MD;  Location: [place];  Service: Orthopedics;  Laterality: Right;   IR FLUORO GUIDE CV LINE RIGHT  01/31/2019   IR FLUORO GUIDE CV LINE RIGHT  02/03/2019   IR THORACENTESIS ASP PLEURAL SPACE W/IMG GUIDE  11/08/2019   IR US GUIDE VASC ACCESS RIGHT  01/31/2019   IR US GUIDE VASC ACCESS RIGHT  02/03/2019   PERIPHERAL VASCULAR BALLOON ANGIOPLASTY  10/15/2021   Procedure: PERIPHERAL VASCULAR BALLOON ANGIOPLASTY;  Surgeon: Broadus John, MD;  Location: [place] [facility];  Service: Cardiovascular;;  rt sfa and trifurcation   SCLEROTHERAPY  05/15/2019   Procedure: SCLEROTHERAPY;  Surgeon: Mansouraty, Telford Nab., MD;  Location: [facility] ENDOSCOPY;  Service: Gastroenterology;;   Family History  Problem Relation Age of Onset   Diabetes Mother    Multiple myeloma Mother    Social History:  reports that he quit smoking about 2 years ago. His smoking use included cigarettes. He started smoking about 3 years ago. He has never been exposed to tobacco smoke. He has never used smokeless tobacco. He reports that he does not currently use alcohol. He reports that he does not currently use drugs. Allergies  Allergen Reactions   Tape Itching    Prefers silk tape over regular tape   Prior to Admission medications   Medication Sig Start Date End Date Taking? Authorizing Provider  acetaminophen (TYLENOL) 500 MG tablet Take 1,000 mg by mouth every 6 (six) hours as needed for mild pain.   Yes [provider]  albuterol (VENTOLIN HFA) 108 (90 Base) MCG/ACT inhaler Inhale 2 puffs into the lungs every 6 (six) hours as needed for wheezing or shortness of breath.   Yes [provider]  amLODipine (NORVASC) 5 MG tablet Take 5 mg by mouth at bedtime.    Yes [provider]  aspirin 81 MG  EC tablet Take 1 tablet (81 mg total) by mouth daily. Swallow whole. 10/17/21  Yes Wells Guiles, DO  atorvastatin (LIPITOR) 40 MG tablet Take 1 tablet (40 mg total) by mouth daily. Patient taking differently: Take 40 mg by mouth at bedtime. 03/13/19  Yes Angiulli, Lavon Paganini, PA-C  bromocriptine (PARLODEL) 2.5 MG tablet TAKE 1 TABLET BY MOUTH AT BEDTIME. Patient taking differently: Take 2.5 mg by mouth at bedtime. 10/29/21  Yes Renato Shin, MD  buPROPion (WELLBUTRIN XL) 300 MG 24 hr tablet Take 1 tablet (300 mg total) by mouth in the morning. 10/16/21  Yes Wells Guiles, DO  calcitRIOL (ROCALTROL) 0.5 MCG capsule Take 4 capsules (2 mcg total) by mouth every Monday, Wednesday, and Friday with hemodialysis. 10/17/21  Yes Wells Guiles, DO  clopidogrel (PLAVIX) 75 MG tablet Take 1 tablet (75 mg total) by mouth daily with breakfast. 10/16/21  Yes Dagoberto Ligas, PA-C  DULoxetine (CYMBALTA) 20  MG capsule Take 20 mg by mouth 2 (two) times daily. 12/25/21  Yes [provider]  ferric citrate (AURYXIA) 1 GM 210 MG(Fe) tablet Take 630 mg by mouth 3 (three) times daily with meals. 03/12/21  Yes [provider]  HYDROmorphone (DILAUDID) 4 MG tablet Take 4 mg by mouth every 8 (eight) hours as needed for moderate pain. 12/25/21  Yes [provider]  ipratropium-albuterol (DUONEB) 0.5-2.5 (3) MG/3ML SOLN Take 3 mLs by nebulization every 6 (six) hours as needed (For shortness of breath).   Yes [provider]  Melatonin 10 MG TABS Take 10 mg by mouth at bedtime.   Yes [provider]  metoprolol tartrate (LOPRESSOR) 25 MG tablet Take 1 tablet (25 mg total) by mouth 2 (two) times daily. 10/16/21  Yes Wells Guiles, DO  midodrine (PROAMATINE) 10 MG tablet Take 10 mg by mouth every Monday, Wednesday, and Friday. 09/17/20  Yes [provider]  ondansetron (ZOFRAN) 4 MG tablet Take 4 mg by mouth daily as needed for vomiting. 01/01/21  Yes [provider]   pantoprazole (PROTONIX) 40 MG tablet Take 1 tablet (40 mg total) by mouth daily. Patient taking differently: Take 40 mg by mouth at bedtime. 03/13/19  Yes Angiulli, Lavon Paganini, PA-C  silver sulfADIAZINE (SILVADENE) 1 % cream Apply 1 application. topically daily. Apply to affected area daily plus dry dressing 12/11/21  Yes [name], MD  tamsulosin (FLOMAX) 0.4 MG CAPS capsule Take 1 capsule (0.4 mg total) by mouth daily after breakfast. Patient taking differently: Take 0.4 mg by mouth at bedtime. 03/13/19  Yes Angiulli, Lavon Paganini, PA-C  vitamin C (ASCORBIC ACID) 500 MG tablet Take 500 mg by mouth at bedtime.   Yes [provider]  oxycodone (OXY-IR) 5 MG capsule Take 1 capsule (5 mg total) by mouth every 6 (six) hours as needed. Patient not taking: Reported on 12/26/2021 07/06/19   Dagoberto Ligas, PA-C   Current Facility-Administered Medications  Medication Dose Route Frequency Provider Last Rate Last Admin   acetaminophen (TYLENOL) tablet 650 mg  650 mg Oral Q6H PRN Rise Patience, MD       Or   acetaminophen (TYLENOL) suppository 650 mg  650 mg Rectal Q6H PRN Rise Patience, MD       albuterol (PROVENTIL) (2.5 MG/3ML) 0.083% nebulizer solution 3 mL  3 mL Inhalation Q6H PRN Rise Patience, MD       alteplase (CATHFLO ACTIVASE) injection 2 mg  2 mg Intracatheter Once PRN Adelfa Koh, NP       amLODipine (NORVASC) tablet 5 mg  5 mg Oral QHS Rise Patience, MD       aspirin EC tablet 81 mg  81 mg Oral Daily Rise Patience, MD   81 mg at 12/26/21 8657   atorvastatin (LIPITOR) tablet 40 mg  40 mg Oral QHS Rise Patience, MD       bromocriptine (PARLODEL) tablet 2.5 mg  2.5 mg Oral QHS Rise Patience, MD       buPROPion (WELLBUTRIN XL) 24 hr tablet 300 mg  300 mg Oral q AM Rise Patience, MD   300 mg at 12/26/21 0818   calcitRIOL (ROCALTROL) capsule 2 mcg  2 mcg Oral Q M,W,F-HD Rise Patience, MD       calcium acetate (PHOSLO)  capsule 1,334 mg  1,334 mg Oral TID WC Nita Sells, MD   1,334 mg at 12/26/21 0835   Chlorhexidine Gluconate Cloth  2 % PADS 6 each  6 each Topical Q0600 Adelfa Koh, NP       DULoxetine (CYMBALTA) DR capsule 20 mg  20 mg Oral BID Rise Patience, MD   20 mg at 12/26/21 2440   heparin injection 1,000 Units  1,000 Units Dialysis PRN Adelfa Koh, NP       heparin injection 5,000 Units  5,000 Units Subcutaneous Q8H Rise Patience, MD   5,000 Units at 12/26/21 0533   HYDROmorphone (DILAUDID) tablet 4 mg  4 mg Oral Q8H PRN Rise Patience, MD   4 mg at 12/26/21 0533   hydrOXYzine (ATARAX) tablet 10 mg  10 mg Oral TID PRN Nita Sells, MD       ipratropium-albuterol (DUONEB) 0.5-2.5 (3) MG/3ML nebulizer solution 3 mL  3 mL Nebulization Q6H PRN Rise Patience, MD       lidocaine (PF) (XYLOCAINE) 1 % injection 5 mL  5 mL Intradermal PRN Adelfa Koh, NP       lidocaine-prilocaine (EMLA) cream 1 application   1 application  Topical PRN Adelfa Koh, NP       melatonin tablet 10 mg  10 mg Oral QHS Rise Patience, MD       metoprolol tartrate (LOPRESSOR) tablet 25 mg  25 mg Oral BID Rise Patience, MD   25 mg at 12/26/21 0818   midodrine (PROAMATINE) tablet 10 mg  10 mg Oral Q M,W,F Rise Patience, MD   10 mg at 12/26/21 1027   oxyCODONE (Oxy IR/ROXICODONE) immediate release tablet 5 mg  5 mg Oral Q6H PRN Nita Sells, MD       pantoprazole (PROTONIX) EC tablet 40 mg  40 mg Oral QHS Rise Patience, MD       pentafluoroprop-tetrafluoroeth (GEBAUERS) aerosol 1 application   1 application  Topical PRN Adelfa Koh, NP       tamsulosin (FLOMAX) capsule 0.4 mg  0.4 mg Oral QHS Rise Patience, MD       Current Outpatient Medications  Medication Sig Dispense Refill   acetaminophen (TYLENOL) 500 MG tablet Take 1,000 mg by mouth every 6 (six) hours as needed for mild pain.     albuterol (VENTOLIN  HFA) 108 (90 Base) MCG/ACT inhaler Inhale 2 puffs into the lungs every 6 (six) hours as needed for wheezing or shortness of breath.     amLODipine (NORVASC) 5 MG tablet Take 5 mg by mouth at bedtime.      aspirin 81 MG EC tablet Take 1 tablet (81 mg total) by mouth daily. Swallow whole. 30 tablet 11   atorvastatin (LIPITOR) 40 MG tablet Take 1 tablet (40 mg total) by mouth daily. (Patient taking differently: Take 40 mg by mouth at bedtime.)     bromocriptine (PARLODEL) 2.5 MG tablet TAKE 1 TABLET BY MOUTH AT BEDTIME. (Patient taking differently: Take 2.5 mg by mouth at bedtime.) 90 tablet 1   buPROPion (WELLBUTRIN XL) 300 MG 24 hr tablet Take 1 tablet (300 mg total) by mouth in the morning.     calcitRIOL (ROCALTROL) 0.5 MCG capsule Take 4 capsules (2 mcg total) by mouth every Monday, Wednesday, and Friday with hemodialysis.     clopidogrel (PLAVIX) 75 MG tablet Take 1 tablet (75 mg total) by mouth daily with breakfast. 30 tablet 2   DULoxetine (CYMBALTA) 20 MG capsule Take 20 mg by mouth 2 (two) times daily.     ferric citrate (AURYXIA) 1 GM  210 MG(Fe) tablet Take 630 mg by mouth 3 (three) times daily with meals.     HYDROmorphone (DILAUDID) 4 MG tablet Take 4 mg by mouth every 8 (eight) hours as needed for moderate pain.     ipratropium-albuterol (DUONEB) 0.5-2.5 (3) MG/3ML SOLN Take 3 mLs by nebulization every 6 (six) hours as needed (For shortness of breath).     Melatonin 10 MG TABS Take 10 mg by mouth at bedtime.     metoprolol tartrate (LOPRESSOR) 25 MG tablet Take 1 tablet (25 mg total) by mouth 2 (two) times daily. 60 tablet 0   midodrine (PROAMATINE) 10 MG tablet Take 10 mg by mouth every Monday, Wednesday, and Friday.     ondansetron (ZOFRAN) 4 MG tablet Take 4 mg by mouth daily as needed for vomiting.     pantoprazole (PROTONIX) 40 MG tablet Take 1 tablet (40 mg total) by mouth daily. (Patient taking differently: Take 40 mg by mouth at bedtime.)     silver sulfADIAZINE (SILVADENE) 1 %  cream Apply 1 application. topically daily. Apply to affected area daily plus dry dressing 400 g 3   tamsulosin (FLOMAX) 0.4 MG CAPS capsule Take 1 capsule (0.4 mg total) by mouth daily after breakfast. (Patient taking differently: Take 0.4 mg by mouth at bedtime.) 30 capsule    vitamin C (ASCORBIC ACID) 500 MG tablet Take 500 mg by mouth at bedtime.     oxycodone (OXY-IR) 5 MG capsule Take 1 capsule (5 mg total) by mouth every 6 (six) hours as needed. (Patient not taking: Reported on 12/26/2021) 20 capsule 0   Labs: Basic Metabolic Panel: Recent Labs  Lab 12/25/21 1903 12/26/21 0414  NA 135 132*  K 4.3 5.1  CL 88* 90*  CO2 28 27  GLUCOSE 153* 133*  BUN 56* 62*  CREATININE 7.50* 8.02*  CALCIUM 9.7 9.3   Liver Function Tests: Recent Labs  Lab 12/25/21 1903  AST 42*  ALT 86*  ALKPHOS 212*  BILITOT 1.3*  PROT 7.8  ALBUMIN 3.2*   No results for input(s): "LIPASE", "AMYLASE" in the last 168 hours. No results for input(s): "AMMONIA" in the last 168 hours. CBC: Recent Labs  Lab 12/25/21 1903 12/26/21 0414  WBC 13.2* 15.5*  NEUTROABS 10.2* 11.7*  HGB 12.7* 12.7*  HCT 40.6 38.8*  MCV 109.4* 108.1*  PLT 343 329   Cardiac Enzymes: No results for input(s): "CKTOTAL", "CKMB", "CKMBINDEX", "TROPONINI" in the last 168 hours. CBG: No results for input(s): "GLUCAP" in the last 168 hours. Iron Studies: No results for input(s): "IRON", "TIBC", "TRANSFERRIN", "FERRITIN" in the last 72 hours. Studies/Results: DG Ankle Complete Right  Result Date: 12/25/2021 CLINICAL DATA:  Soft tissue wound along the plantar aspect of the right foot with decreased healing, initial encounter EXAM: RIGHT ANKLE - COMPLETE 3+ VIEW COMPARISON:  10/06/2021 FINDINGS: Previously seen lucency within the distal tibia is no longer identified. Soft tissue wound is noted along the superior aspect of the calcaneus near the Achilles insertion. This is new from the prior exam. There are rows of changes in this region  consistent with osteomyelitis. Tarsal degenerative changes are noted. IMPRESSION: New nonhealing wound along the posterior aspect of the ankle with erosive changes along the posterosuperior calcaneus consistent with osteomyelitis. Electronically Signed   By: Inez Catalina M.D.   On: 12/25/2021 19:38    Physical Exam: Vitals:   12/26/21 0600 12/26/21 0804 12/26/21 0807 12/26/21 0808  BP: (!) 147/98   109/80  Pulse:  89  Resp: 13   18  Temp:      TempSrc:      SpO2:  99%  100%  Weight:   102.1 kg   Height:   '5\' 10"'  (1.778 m)      General: Drowsy but answers questions appropriately; appears uncomfortable-in pain; NAD Head: NCAT sclera not icteric Lungs: CTA bilaterally. No wheeze, rales or rhonchi. On RA. Heart: RRR. No murmur, rubs or gallops.  Abdomen: soft, nontender, +BS Lower extremities: L BKA; RLE discolored; noted old scabs and open wound R ankle-wrapped with guaze; no drainage or edema.  Neuro: AAOx3. Moves all extremities spontaneously. Dialysis Access: L AVF (+) B/T  Dialysis Orders:  [facility], BFR 400, DFR 500,  EDW 98.4, 2K/ 2Ca Calcitriol 64mg PO qHD  Last Labs: Hgb 12.7, K 5.1, Ca 9.3, Alb 3.2  Assessment/Plan: Chronic RLE wound/Osteomyelitis-followed by Dr. DSharol Givenoutpatient. Outpatient ABXs recently switched to Cefepime. Reviewed Hospitalist note: plan to cover with Cefepime and Flagyl. BC X 2 obtained-results pending. ESRD - on HD MWF. Plan for HD today. 2.5-3.5L as tolerated. Hypertension/volume- Euvolemic on exam and Bps acceptable. Current BP soft. Continue Midodrine with HD. Anemia of CKD - Hgb at goal. No indication for ESA/Fe at this time. Also with infectious picture Secondary Hyperparathyroidism - will obtain PO4 level with next labs. Nutrition - renal diet with fluid restriction.  CTobie Poet NP [facility] 12/26/2021, 10:26 AM

## 2021-12-26 NOTE — H&P (Signed)
History and Physical    Michael Riggs ZDG:[id number] DOB: 12/20/1970 DOA: 12/25/2021  PCP: [name], MD  Patient coming from: Home.  Chief Complaint: Increasing discharge from right ankle wound.  HPI: Michael Riggs is a 51 y.o. male with history of ESRD on hemodialysis Monday Wednesday Friday, hypertension, hyperprolactinemia, peripheral vascular disease who has had chronic nonhealing wound of the right ankle has had debridement and skin grafting about 2 months ago noticed increasing discharge from the area over the last 4 days.  Denies any fever chills.  ED Course: In the ER x-rays revealed features concerning for osteomyelitis of the right calcaneal area.  Since patient was not febrile patient was not started on antibiotics.  Admitted for further work-up.  Review of Systems: As per HPI, rest all negative.   Past Medical History:  Diagnosis Date   Anemia    Anemia of chronic disease    Anxiety    ARF (acute renal failure) ([place]) 01/30/2019   Calciphylaxis    Controlled type 2 diabetes mellitus with hyperglycemia, without long-term current use of insulin ([place])    Debility 02/09/2019   Depression    Diabetes mellitus type 2 in obese ([place]) 07/04/2013   Diabetes mellitus with peripheral vascular disease ([place])    type 2 no meds, lost 100 lbs   Dyslipidemia    Dyspnea    inhaler   End stage renal disease ([place])    ESRD (end stage renal disease) on dialysis ([place]) 01/2019   MWFS   ESRD on dialysis [facility])    Essential hypertension    Fluid overload 02/04/2019   GERD (gastroesophageal reflux disease)    Goals of care, counseling/discussion    Habitual alcohol use 07/04/2013   Headache    Hyperkalemia 01/30/2019   Hypertension    Hypoglycemia 01/30/2019   Hypokalemia 01/30/2019   Labile blood pressure    Leukocytosis    Lower GI bleed 05/14/2019   Macrocytic anemia 01/30/2019   Obesity, Class II, BMI 35-39.9, with comorbidity 07/04/2013   Palliative care encounter     PDR (proliferative diabetic retinopathy) ([place]) 12/14/2013   Physical deconditioning    Pressure injury of skin 04/27/2019   Pruritus    Scrotal edema    Scrotal pain    Sleep apnea    uses CPAP   Sleep disturbance    Slow transit constipation    Status post below-knee amputation of left lower extremity ([place])    Stroke ([place])    mini -shown on CT scan   Syphilis 01/2019   Urinary retention    Venous hypertension 09/22/2013   Wound infection 03/28/2019    Past Surgical History:  Procedure Laterality Date   ABDOMINAL AORTOGRAM W/LOWER EXTREMITY N/A 10/15/2021   Procedure: ABDOMINAL AORTOGRAM W/LOWER EXTREMITY;  Surgeon: Broadus John, MD;  Location: [place] [facility];  Service: Cardiovascular;  Laterality: N/A;   AV FISTULA PLACEMENT Left 01/09/2019   Procedure: ARTERIOVENOUS (AV) FISTULA CREATION LEFT UPPER ARM;  Surgeon: [name], MD;  Location: [facility];  Service: Vascular;  Laterality: Left;   [place] Left 07/06/2019   Procedure: BASILIC VEIN TRANSPOSITION SECOND STAGE LEFT ARM;  Surgeon: [name], MD;  Location: [place];  Service: Vascular;  Laterality: Left;   below the knee amputation Left    EYE SURGERY Bilateral    FLEXIBLE SIGMOIDOSCOPY N/A 05/15/2019   Procedure: FLEXIBLE SIGMOIDOSCOPY;  Surgeon: Irving Copas., MD;  Location: [place];  Service: Gastroenterology;  Laterality:  N/A;   HEMOSTASIS CLIP PLACEMENT  05/15/2019   Procedure: HEMOSTASIS CLIP PLACEMENT;  Surgeon: Irving Copas., MD;  Location: [place];  Service: Gastroenterology;;   HOT HEMOSTASIS N/A 05/15/2019   Procedure: HOT HEMOSTASIS (ARGON PLASMA COAGULATION/BICAP);  Surgeon: Irving Copas., MD;  Location: [place];  Service: Gastroenterology;  Laterality: N/A;   I & D EXTREMITY Right 10/10/2021   Procedure: RIGHT ACHILLES DEBRIDEMENT AND TISSUE GRAFT;  Surgeon: [name], MD;  Location: [place];  Service: Orthopedics;  Laterality: Right;   IR  FLUORO GUIDE CV LINE RIGHT  01/31/2019   IR FLUORO GUIDE CV LINE RIGHT  02/03/2019   IR THORACENTESIS ASP PLEURAL SPACE W/IMG GUIDE  11/08/2019   IR US GUIDE VASC ACCESS RIGHT  01/31/2019   IR US GUIDE VASC ACCESS RIGHT  02/03/2019   PERIPHERAL VASCULAR BALLOON ANGIOPLASTY  10/15/2021   Procedure: PERIPHERAL VASCULAR BALLOON ANGIOPLASTY;  Surgeon: Broadus John, MD;  Location: [place] [facility];  Service: Cardiovascular;;  rt sfa and trifurcation   SCLEROTHERAPY  05/15/2019   Procedure: SCLEROTHERAPY;  Surgeon: Mansouraty, Telford Nab., MD;  Location: [place];  Service: Gastroenterology;;     reports that he quit smoking about 2 years ago. His smoking use included cigarettes. He started smoking about 3 years ago. He has never been exposed to tobacco smoke. He has never used smokeless tobacco. He reports that he does not currently use alcohol. He reports that he does not currently use drugs.  Allergies  Allergen Reactions   Tape Itching    Prefers silk tape over regular tape    Family History  Problem Relation Age of Onset   Diabetes Mother    Multiple myeloma Mother     Prior to Admission medications   Medication Sig Start Date End Date Taking? Authorizing Provider  acetaminophen (TYLENOL) 500 MG tablet Take 1,000 mg by mouth every 6 (six) hours as needed for mild pain.   Yes [provider]  albuterol (VENTOLIN HFA) 108 (90 Base) MCG/ACT inhaler Inhale 2 puffs into the lungs every 6 (six) hours as needed for wheezing or shortness of breath.   Yes [provider]  amLODipine (NORVASC) 5 MG tablet Take 5 mg by mouth at bedtime.    Yes [provider]  aspirin 81 MG EC tablet Take 1 tablet (81 mg total) by mouth daily. Swallow whole. 10/17/21  Yes Wells Guiles, DO  atorvastatin (LIPITOR) 40 MG tablet Take 1 tablet (40 mg total) by mouth daily. Patient taking differently: Take 40 mg by mouth at bedtime. 03/13/19  Yes Angiulli, Lavon Paganini, PA-C  bromocriptine  (PARLODEL) 2.5 MG tablet TAKE 1 TABLET BY MOUTH AT BEDTIME. Patient taking differently: Take 2.5 mg by mouth at bedtime. 10/29/21  Yes Renato Shin, MD  buPROPion (WELLBUTRIN XL) 300 MG 24 hr tablet Take 1 tablet (300 mg total) by mouth in the morning. 10/16/21  Yes Wells Guiles, DO  calcitRIOL (ROCALTROL) 0.5 MCG capsule Take 4 capsules (2 mcg total) by mouth every Monday, Wednesday, and Friday with hemodialysis. 10/17/21  Yes Wells Guiles, DO  clopidogrel (PLAVIX) 75 MG tablet Take 1 tablet (75 mg total) by mouth daily with breakfast. 10/16/21  Yes Dagoberto Ligas, PA-C  DULoxetine (CYMBALTA) 20 MG capsule Take 20 mg by mouth 2 (two) times daily. 12/25/21  Yes [provider]  ferric citrate (AURYXIA) 1 GM 210 MG(Fe) tablet Take 630 mg by mouth 3 (three) times daily with meals. 03/12/21  Yes [provider]  HYDROmorphone (DILAUDID) 4 MG tablet Take 4 mg by mouth every 8 (eight) hours as needed for moderate pain. 12/25/21  Yes [provider]  ipratropium-albuterol (DUONEB) 0.5-2.5 (3) MG/3ML SOLN Take 3 mLs by nebulization every 6 (six) hours as needed (For shortness of breath).   Yes [provider]  Melatonin 10 MG TABS Take 10 mg by mouth at bedtime.   Yes [provider]  metoprolol tartrate (LOPRESSOR) 25 MG tablet Take 1 tablet (25 mg total) by mouth 2 (two) times daily. 10/16/21  Yes Wells Guiles, DO  midodrine (PROAMATINE) 10 MG tablet Take 10 mg by mouth every Monday, Wednesday, and Friday. 09/17/20  Yes [provider]  ondansetron (ZOFRAN) 4 MG tablet Take 4 mg by mouth daily as needed for vomiting. 01/01/21  Yes [provider]  pantoprazole (PROTONIX) 40 MG tablet Take 1 tablet (40 mg total) by mouth daily. Patient taking differently: Take 40 mg by mouth at bedtime. 03/13/19  Yes Angiulli, Lavon Paganini, PA-C  silver sulfADIAZINE (SILVADENE) 1 % cream Apply 1 application. topically daily. Apply to affected area daily plus dry dressing  12/11/21  Yes [name], MD  tamsulosin (FLOMAX) 0.4 MG CAPS capsule Take 1 capsule (0.4 mg total) by mouth daily after breakfast. Patient taking differently: Take 0.4 mg by mouth at bedtime. 03/13/19  Yes Angiulli, Lavon Paganini, PA-C  vitamin C (ASCORBIC ACID) 500 MG tablet Take 500 mg by mouth at bedtime.   Yes [provider]  oxycodone (OXY-IR) 5 MG capsule Take 1 capsule (5 mg total) by mouth every 6 (six) hours as needed. Patient not taking: Reported on 12/26/2021 07/06/19   Dagoberto Ligas, PA-C    Physical Exam: Constitutional: Moderately built and nourished. Vitals:   12/25/21 2202 12/26/21 0032 12/26/21 0034 12/26/21 0130  BP: (!) 158/95 132/90  (!) 134/96  Pulse: 93 92 94 90  Resp: '18 14 12 20  ' Temp: 98.7 F (37.1 C) 97.6 F (36.4 C)    TempSrc: Oral Oral    SpO2: 100% 100% 98% 97%   Eyes: Anicteric no pallor. ENMT: No discharge from the ears eyes nose and mouth. Neck: No mass felt.  No neck rigidity. Respiratory: No rhonchi or crepitations. Cardiovascular: S1-S2 heard. Abdomen: Soft nontender bowel sound present. Musculoskeletal: Wound on the right ankle with discharge. Skin: Wound on the right ankle with discharge. Neurologic: Alert awake oriented time place and person.  Moves all extremities. Psychiatric: Appears normal.  Normal affect.   Labs on Admission: I have personally reviewed following labs and imaging studies  CBC: Recent Labs  Lab 12/25/21 1903  WBC 13.2*  NEUTROABS 10.2*  HGB 12.7*  HCT 40.6  MCV 109.4*  PLT 158   Basic Metabolic Panel: Recent Labs  Lab 12/25/21 1903  NA 135  K 4.3  CL 88*  CO2 28  GLUCOSE 153*  BUN 56*  CREATININE 7.50*  CALCIUM 9.7   GFR: CrCl cannot be calculated (Unknown ideal weight.). Liver Function Tests: Recent Labs  Lab 12/25/21 1903  AST 42*  ALT 86*  ALKPHOS 212*  BILITOT 1.3*  PROT 7.8  ALBUMIN 3.2*   No results for input(s): "LIPASE", "AMYLASE" in the last 168 hours. No results for  input(s): "AMMONIA" in the last 168 hours. Coagulation Profile: No results for input(s): "INR", "PROTIME" in the last 168 hours. Cardiac Enzymes: No results for input(s): "CKTOTAL", "CKMB", "CKMBINDEX", "TROPONINI" in the last 168 hours. BNP (last 3 results) No results for input(s): "PROBNP"  in the last 8760 hours. HbA1C: No results for input(s): "HGBA1C" in the last 72 hours. CBG: No results for input(s): "GLUCAP" in the last 168 hours. Lipid Profile: No results for input(s): "CHOL", "HDL", "LDLCALC", "TRIG", "CHOLHDL", "LDLDIRECT" in the last 72 hours. Thyroid Function Tests: No results for input(s): "TSH", "T4TOTAL", "FREET4", "T3FREE", "THYROIDAB" in the last 72 hours. Anemia Panel: No results for input(s): "VITAMINB12", "FOLATE", "FERRITIN", "TIBC", "IRON", "RETICCTPCT" in the last 72 hours. Urine analysis:    Component Value Date/Time   COLORURINE YELLOW 03/10/2019 1054   APPEARANCEUR TURBID (A) 03/10/2019 1054   LABSPEC 1.020 03/10/2019 1054   PHURINE 5.0 03/10/2019 1054   GLUCOSEU 50 (A) 03/10/2019 1054   HGBUR SMALL (A) 03/10/2019 1054   BILIRUBINUR NEGATIVE 03/10/2019 1054   [place] 03/10/2019 1054   PROTEINUR >=300 (A) 03/10/2019 1054   NITRITE NEGATIVE 03/10/2019 1054   LEUKOCYTESUR LARGE (A) 03/10/2019 1054   Sepsis Labs: '@LABRCNTIP' (procalcitonin:4,lacticidven:4) )No results found for this or any previous visit (from the past 240 hour(s)).   Radiological Exams on Admission: DG Ankle Complete Right  Result Date: 12/25/2021 CLINICAL DATA:  Soft tissue wound along the plantar aspect of the right foot with decreased healing, initial encounter EXAM: RIGHT ANKLE - COMPLETE 3+ VIEW COMPARISON:  10/06/2021 FINDINGS: Previously seen lucency within the distal tibia is no longer identified. Soft tissue wound is noted along the superior aspect of the calcaneus near the Achilles insertion. This is new from the prior exam. There are rows of changes in this region  consistent with osteomyelitis. Tarsal degenerative changes are noted. IMPRESSION: New nonhealing wound along the posterior aspect of the ankle with erosive changes along the posterosuperior calcaneus consistent with osteomyelitis. Electronically Signed   By: Inez Catalina M.D.   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 Sharol Given patient's orthopedic surgeon.  Since patient appears nonseptic and afebrile antibiotics was not started.  Likely will start after cultures obtained from the wound. ESRD on hemodialysis on Monday Wednesday and Friday consult nephrology. Hypertension on amlodipine and metoprolol. Peripheral vascular disease follows with vascular surgery continue antiplatelet agents and statins. Anemia likely from renal disease follow CBC. Hyperprolactinemia on bromocriptine. History of stroke.   Since patient has osteomyelitis of the right ankle will need close monitoring and further management inpatient status.   DVT prophylaxis: Heparin. Code Status: Full code. Family Communication: Discussed with patient. Disposition Plan: Home. Consults called: None. Admission status: Inpatient.   Rise Patience MD [facility] Pager [phone].  If 7PM-7AM, please contact night-coverage [email or web address] Password [facility]  12/26/2021, 3:39 AM

## 2021-12-26 NOTE — ED Provider Notes (Signed)
[facility] EMERGENCY DEPARTMENT Provider Note  CSN: [id number] Arrival date & time: 12/25/21 1843  Chief Complaint(s) Wound Infection  HPI Michael Riggs is a 51 y.o. male with a past medical history listed below including hypertension, hyperlipidemia, diabetes, ESRD on dialysis, peripheral vascular disease with chronic wounds that previously required left BKA and is currently under the care of Dr. Sharol Given for right heel chronic wound presents to the emergency department for worsening and extension of the right heel wound.  He reports that over the past 4 days the wound has gotten bigger and is now foul-smelling.  He saw Dr. Sharol Given 2 weeks ago and is scheduled to see him next week.  He reports that he has been doing daily soapy water rinses and dressing changes.  HPI  Past Medical History Past Medical History:  Diagnosis Date   Anemia    Anemia of chronic disease    Anxiety    ARF (acute renal failure) ([place]) 01/30/2019   Calciphylaxis    Controlled type 2 diabetes mellitus with hyperglycemia, without long-term current use of insulin ([place])    Debility 02/09/2019   Depression    Diabetes mellitus type 2 in obese ([place]) 07/04/2013   Diabetes mellitus with peripheral vascular disease ([place])    type 2 no meds, lost 100 lbs   Dyslipidemia    Dyspnea    inhaler   End stage renal disease ([place])    ESRD (end stage renal disease) on dialysis ([place]) 01/2019   MWFS   ESRD on dialysis [facility])    Essential hypertension    Fluid overload 02/04/2019   GERD (gastroesophageal reflux disease)    Goals of care, counseling/discussion    Habitual alcohol use 07/04/2013   Headache    Hyperkalemia 01/30/2019   Hypertension    Hypoglycemia 01/30/2019   Hypokalemia 01/30/2019   Labile blood pressure    Leukocytosis    Lower GI bleed 05/14/2019   Macrocytic anemia 01/30/2019   Obesity, Class II, BMI 35-39.9, with comorbidity 07/04/2013   Palliative care encounter    PDR (proliferative  diabetic retinopathy) ([place]) 12/14/2013   Physical deconditioning    Pressure injury of skin 04/27/2019   Pruritus    Scrotal edema    Scrotal pain    Sleep apnea    uses CPAP   Sleep disturbance    Slow transit constipation    Status post below-knee amputation of left lower extremity ([place])    Stroke ([place])    mini -shown on CT scan   Syphilis 01/2019   Urinary retention    Venous hypertension 09/22/2013   Wound infection 03/28/2019   Patient Active Problem List   Diagnosis Date Noted   Cellulitis of right lower leg    Cerebral thrombosis with cerebral infarction 10/08/2021   Gangrene associated with type 2 diabetes mellitus ([place])    PVD (peripheral vascular disease) ([place])    Nonhealing wound of heel 10/06/2021   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   Personal history of COVID-19 09/23/2020   COVID-19 09/11/2020   Acute respiratory failure with hypoxia ([place]) 11/06/2019   CAP (community acquired pneumonia) 10/11/2019   Generalized hyperhidrosis 10/11/2019   Allergy, unspecified, initial encounter 10/03/2019   Diarrhea, unspecified 07/24/2019   Lower GI bleed 05/14/2019   Pressure injury of skin 04/27/2019   Admission for palliative care 04/27/2019   Major depressive disorder, single episode, unspecified 04/27/2019   Other disorders of calcium metabolism 04/27/2019   Palliative care encounter    Goals  of care, counseling/discussion    Calciphylaxis    Wound infection 03/28/2019   Hypertension    Dyslipidemia    Diabetes mellitus with peripheral vascular disease ([place])    Depression    Physical deconditioning    Coagulation defect, unspecified ([place]) 03/15/2019   Other malaise 03/14/2019   Other specified disorders of the male genital organs 03/14/2019   Other specified symptoms and signs involving the circulatory and respiratory systems 03/14/2019   Unspecified protein-calorie malnutrition ([place]) 03/14/2019   Pruritus    Scrotal pain    Labile blood pressure    Scrotal edema     Status post below-knee amputation of left lower extremity (H[facility])    Slow transit constipation    Leukocytosis    Sleep disturbance    Essential hypertension    Anemia of chronic disease    Controlled type 2 diabetes mellitus with hyperglycemia, without long-term current use of insulin ([place])    ESRD on dialysis ([place])    Urinary retention    Debility 02/09/2019   Fluid overload 02/04/2019   Dyspnea, unspecified 02/03/2019   Gastro-esophageal reflux disease without esophagitis 02/03/2019   Hyperlipidemia, unspecified 02/03/2019   Iron deficiency anemia, unspecified 02/03/2019   Major depressive disorder, recurrent, in partial remission ([place]) 02/03/2019   Other problems related to lifestyle 02/03/2019   Pain in right leg 02/03/2019   Pain, unspecified 02/03/2019   Psychophysiologic insomnia 02/03/2019   Secondary hyperparathyroidism of renal origin ([place]) 02/03/2019   Testicular hypofunction 02/03/2019   Hyperkalemia 01/30/2019   ARF (acute renal failure) ([place]) 01/30/2019   Hypokalemia 01/30/2019   Hypoglycemia 01/30/2019   Syphilis 01/30/2019   Macrocytic anemia 01/30/2019   ESRD (end stage renal disease) ([place])    ESRD (end stage renal disease) on dialysis ([place]) 01/2019   PDR (proliferative diabetic retinopathy) ([place]) 12/14/2013   Venous hypertension 09/22/2013   Acquired absence of left leg below knee ([place]) 07/20/2013   Diabetes mellitus type 2 in obese ([place]) 07/04/2013   Habitual alcohol use 07/04/2013   Obesity, Class II, BMI 35-39.9, with comorbidity 07/04/2013   Home Medication(s) Prior to Admission medications   Medication Sig Start Date End Date Taking? Authorizing Provider  acetaminophen (TYLENOL) 500 MG tablet Take 1,000 mg by mouth every 6 (six) hours as needed for mild pain.   Yes [provider]  albuterol (VENTOLIN HFA) 108 (90 Base) MCG/ACT inhaler Inhale 2 puffs into the lungs every 6 (six) hours as needed for wheezing or shortness of breath.   Yes  [provider]  amLODipine (NORVASC) 5 MG tablet Take 5 mg by mouth at bedtime.    Yes [provider]  aspirin 81 MG EC tablet Take 1 tablet (81 mg total) by mouth daily. Swallow whole. 10/17/21  Yes Wells Guiles, DO  atorvastatin (LIPITOR) 40 MG tablet Take 1 tablet (40 mg total) by mouth daily. Patient taking differently: Take 40 mg by mouth at bedtime. 03/13/19  Yes Angiulli, Lavon Paganini, PA-C  bromocriptine (PARLODEL) 2.5 MG tablet TAKE 1 TABLET BY MOUTH AT BEDTIME. Patient taking differently: Take 2.5 mg by mouth at bedtime. 10/29/21  Yes Renato Shin, MD  buPROPion (WELLBUTRIN XL) 300 MG 24 hr tablet Take 1 tablet (300 mg total) by mouth in the morning. 10/16/21  Yes Wells Guiles, DO  calcitRIOL (ROCALTROL) 0.5 MCG capsule Take 4 capsules (2 mcg total) by mouth every Monday, Wednesday, and Friday with hemodialysis. 10/17/21  Yes Wells Guiles, DO  clopidogrel (PLAVIX) 75 MG tablet Take 1  tablet (75 mg total) by mouth daily with breakfast. 10/16/21  Yes Dagoberto Ligas, PA-C  DULoxetine (CYMBALTA) 20 MG capsule Take 20 mg by mouth 2 (two) times daily. 12/25/21  Yes [provider]  ferric citrate (AURYXIA) 1 GM 210 MG(Fe) tablet Take 630 mg by mouth 3 (three) times daily with meals. 03/12/21  Yes [provider]  HYDROmorphone (DILAUDID) 4 MG tablet Take 4 mg by mouth every 8 (eight) hours as needed for moderate pain. 12/25/21  Yes [provider]  ipratropium-albuterol (DUONEB) 0.5-2.5 (3) MG/3ML SOLN Take 3 mLs by nebulization every 6 (six) hours as needed (For shortness of breath).   Yes [provider]  Melatonin 10 MG TABS Take 10 mg by mouth at bedtime.   Yes [provider]  metoprolol tartrate (LOPRESSOR) 25 MG tablet Take 1 tablet (25 mg total) by mouth 2 (two) times daily. 10/16/21  Yes Wells Guiles, DO  midodrine (PROAMATINE) 10 MG tablet Take 10 mg by mouth every Monday, Wednesday, and Friday. 09/17/20  Yes [provider]  ondansetron (ZOFRAN) 4 MG tablet Take 4 mg by mouth daily as needed for vomiting. 01/01/21  Yes [provider]  pantoprazole (PROTONIX) 40 MG tablet Take 1 tablet (40 mg total) by mouth daily. Patient taking differently: Take 40 mg by mouth at bedtime. 03/13/19  Yes Angiulli, Lavon Paganini, PA-C  silver sulfADIAZINE (SILVADENE) 1 % cream Apply 1 application. topically daily. Apply to affected area daily plus dry dressing 12/11/21  Yes [name], MD  tamsulosin (FLOMAX) 0.4 MG CAPS capsule Take 1 capsule (0.4 mg total) by mouth daily after breakfast. Patient taking differently: Take 0.4 mg by mouth at bedtime. 03/13/19  Yes Angiulli, Lavon Paganini, PA-C  vitamin C (ASCORBIC ACID) 500 MG tablet Take 500 mg by mouth at bedtime.   Yes [provider]  oxycodone (OXY-IR) 5 MG capsule Take 1 capsule (5 mg total) by mouth every 6 (six) hours as needed. Patient not taking: Reported on 12/26/2021 07/06/19   Dagoberto Ligas, PA-C                                                                                                                                    Allergies Tape  Review of Systems Review of Systems As noted in HPI  Physical Exam Vital Signs  I have reviewed the triage vital signs BP (!) 134/96   Pulse 90   Temp 97.6 F (36.4 C) (Oral)   Resp 20   SpO2 97%   Physical Exam Vitals reviewed.  Constitutional:      General: He is not in acute distress.    Appearance: He is well-developed. He is not diaphoretic.  HENT:     Head: Normocephalic and atraumatic.     Right Ear: External ear normal.     Left Ear: External ear normal.     Nose: Nose normal.  Mouth/Throat:     Mouth: Mucous membranes are moist.  Eyes:     General: No scleral icterus.    Conjunctiva/sclera: Conjunctivae normal.  Neck:     Trachea: Phonation normal.  Cardiovascular:     Rate and Rhythm: Normal rate and regular rhythm.  Pulmonary:     Effort: Pulmonary effort is normal. No  respiratory distress.     Breath sounds: No stridor.  Abdominal:     General: There is no distension.  Musculoskeletal:        General: Normal range of motion.     Cervical back: Normal range of motion.       Legs:  Skin:    Comments: Chronic skin wound with calciphylaxis   Neurological:     Mental Status: He is alert and oriented to person, place, and time.  Psychiatric:        Behavior: Behavior normal.          ED Results and Treatments Labs (all labs ordered are listed, but only abnormal results are displayed) Labs Reviewed  COMPREHENSIVE METABOLIC PANEL - Abnormal; Notable for the following components:      Result Value   Chloride 88 (*)    Glucose, Bld 153 (*)    BUN 56 (*)    Creatinine, Ser 7.50 (*)    Albumin 3.2 (*)    AST 42 (*)    ALT 86 (*)    Alkaline Phosphatase 212 (*)    Total Bilirubin 1.3 (*)    GFR, Estimated 8 (*)    Anion gap 19 (*)    All other components within normal limits  CBC WITH DIFFERENTIAL/PLATELET - Abnormal; Notable for the following components:   WBC 13.2 (*)    RBC 3.71 (*)    Hemoglobin 12.7 (*)    MCV 109.4 (*)    MCH 34.2 (*)    RDW 15.9 (*)    Neutro Abs 10.2 (*)    Abs Immature Granulocytes 0.48 (*)    All other components within normal limits  CULTURE, BLOOD (ROUTINE X 2)  CULTURE, BLOOD (ROUTINE X 2)  LACTIC ACID, PLASMA                                                                                                                         EKG  EKG Interpretation  Date/Time:    Ventricular Rate:    PR Interval:    QRS Duration:   QT Interval:    QTC Calculation:   R Axis:     Text Interpretation:         Radiology DG Ankle Complete Right  Result Date: 12/25/2021 CLINICAL DATA:  Soft tissue wound along the plantar aspect of the right foot with decreased healing, initial encounter EXAM: RIGHT ANKLE - COMPLETE 3+ VIEW COMPARISON:  10/06/2021 FINDINGS: Previously seen lucency within the distal tibia is no  longer identified. Soft tissue wound is noted along the superior aspect of the calcaneus near the  Achilles insertion. This is new from the prior exam. There are rows of changes in this region consistent with osteomyelitis. Tarsal degenerative changes are noted. IMPRESSION: New nonhealing wound along the posterior aspect of the ankle with erosive changes along the posterosuperior calcaneus consistent with osteomyelitis. Electronically Signed   By: Inez Catalina M.D.   On: 12/25/2021 19:38    Pertinent labs & imaging results that were available during my care of the patient were reviewed by me and considered in my medical decision making (see MDM for details).  Medications Ordered in ED Medications  acetaminophen (TYLENOL) tablet 1,000 mg (1,000 mg Oral Given 12/26/21 0129)                                                                                                                                     Procedures Procedures  (including critical care time)  Medical Decision Making / ED Course    Complexity of Problem:  Co-morbidities/SDOH that complicate the patient evaluation/care: Noted above in HPI  Additional history obtained: Clinic visit with Dr. Sharol Given 2 weeks ago containing image of the right heel wound.  Patient's presenting problem/concern, DDX, and MDM listed below: Worsening right heel wound Wound is now deep, concerning for osteomyelitis. We will need to obtain labs and imaging.  Hospitalization Considered:  Yes  Initial Intervention:  Tylenol    Complexity of Data:   Cardiac Monitoring: The patient was maintained on a cardiac monitor.   I personally viewed and interpreted the cardiac monitored which showed an underlying rhythm of normal sinus rhythm with rates in the 90s  Laboratory Tests ordered listed below with my independent interpretation: CBC with leukocytosis.  No anemia Metabolic panel consistent with ESRD.  Elevated alk phos concerning for bony  breakdown Lactic acid normal   Imaging Studies ordered listed below with my independent interpretation: X-ray of the left foot notable for bony erosive changes concerning for osteomyelitis      ED Course:    Assessment, Add'l Intervention, and Reassessment: Left heel wound Concern for osteomyelitis. Patient is not septic at this time. We will defer antibiotics. Patient admitted to medicine.  I spoke with Dr. Hal Hope who agreed to admit patient for further work-up and management.    Final Clinical Impression(s) / ED Diagnoses Final diagnoses:  Wound infection           This chart was dictated using voice recognition software.  Despite best efforts to proofread,  errors can occur which can change the documentation meaning.    Fatima Blank, MD 12/26/21 [id number]

## 2021-12-26 NOTE — Progress Notes (Signed)
Michael Riggs a 51 y.o. male admitted on 12/26/2021 with ESRD with HD MWF, PVD, and L BKA  being followed outpt by ortho and told to return for chronic nonhealing wound of R calcaneous with CT concerning for osteomyelitis.  Wound cultures from 10/10/21 grew abundant [employer], abundant Morganella morganii, and abundant Corynebacterium with no anaerobes isolated. Blood cultures from 10/06/21 growing Strep spp. Pharmacy has been consulted for cefepime dosing.  Scr 8.02 (12/26/2021), LA 1.3 (12/25/2021), WBC 15.5 (12/26/2021) Vital Signs: afebrile, HR WNL, BP elevated  Estimated Creatinine Clearance: 13.3 mL/min (A) (by C-G formula based on SCr of 8.02 mg/dL (H)).  Plan: START Cefepime 1g IV Q24H F/U HD schedule, tolerability, deescalation, ortho recs   Allergies:  Allergies  Allergen Reactions   Tape Itching    Prefers silk tape over regular tape    Filed Weights   12/26/21 0807  Weight: 102.1 kg (225 lb)       Latest Ref Rng & Units 12/26/2021    4:14 AM 12/25/2021    7:03 PM 10/17/2021   12:30 PM  CBC  WBC 4.0 - 10.5 K/uL 15.5  13.2  10.9   Hemoglobin 13.0 - 17.0 g/dL 12.7  12.7  12.5   Hematocrit 39.0 - 52.0 % 38.8  40.6  38.8   Platelets 150 - 400 K/uL 329  343  245     Antimicrobials this admission: cefepime 12/26/2021>>   Microbiology results: 12/26/2021 Bcx: sent  Thank you for allowing pharmacy to be a part of this patient's care.  Adria Dill, PharmD PGY-1 Acute Care Resident  12/26/2021 12:28 PM

## 2021-12-27 DIAGNOSIS — M86171 Other acute osteomyelitis, right ankle and foot: Secondary | ICD-10-CM | POA: Diagnosis not present

## 2021-12-27 LAB — GLUCOSE, CAPILLARY
Glucose-Capillary: 184 mg/dL — ABNORMAL HIGH (ref 70–99)
Glucose-Capillary: 105 mg/dL — ABNORMAL HIGH (ref 70–99)

## 2021-12-27 LAB — CBC WITH DIFFERENTIAL/PLATELET
Abs Immature Granulocytes: 0.64 10*3/uL — ABNORMAL HIGH (ref 0.00–0.07)
Basophils Absolute: 0.1 10*3/uL (ref 0.0–0.1)
Basophils Relative: 1 %
Eosinophils Absolute: 0.2 10*3/uL (ref 0.0–0.5)
Eosinophils Relative: 1 %
HCT: 38 % — ABNORMAL LOW (ref 39.0–52.0)
Hemoglobin: 12.5 g/dL — ABNORMAL LOW (ref 13.0–17.0)
Immature Granulocytes: 4 %
Lymphocytes Relative: 12 %
Lymphs Abs: 1.7 10*3/uL (ref 0.7–4.0)
MCH: 34.9 pg — ABNORMAL HIGH (ref 26.0–34.0)
MCHC: 32.9 g/dL (ref 30.0–36.0)
MCV: 106.1 fL — ABNORMAL HIGH (ref 80.0–100.0)
Monocytes Absolute: 1.3 10*3/uL — ABNORMAL HIGH (ref 0.1–1.0)
Monocytes Relative: 9 %
Neutro Abs: 10.6 10*3/uL — ABNORMAL HIGH (ref 1.7–7.7)
Neutrophils Relative %: 73 %
Platelets: 353 10*3/uL (ref 150–400)
RBC: 3.58 MIL/uL — ABNORMAL LOW (ref 4.22–5.81)
RDW: 15.7 % — ABNORMAL HIGH (ref 11.5–15.5)
WBC: 14.5 10*3/uL — ABNORMAL HIGH (ref 4.0–10.5)
nRBC: 0.2 % (ref 0.0–0.2)

## 2021-12-27 LAB — RENAL FUNCTION PANEL
Albumin: 3 g/dL — ABNORMAL LOW (ref 3.5–5.0)
Anion gap: 13 (ref 5–15)
BUN: 40 mg/dL — ABNORMAL HIGH (ref 6–20)
CO2: 30 mmol/L (ref 22–32)
Calcium: 9.6 mg/dL (ref 8.9–10.3)
Chloride: 89 mmol/L — ABNORMAL LOW (ref 98–111)
Creatinine, Ser: 5.87 mg/dL — ABNORMAL HIGH (ref 0.61–1.24)
GFR, Estimated: 11 mL/min — ABNORMAL LOW (ref >60)
Glucose, Bld: 110 mg/dL — ABNORMAL HIGH (ref 70–99)
Phosphorus: 5.4 mg/dL — ABNORMAL HIGH (ref 2.5–4.6)
Potassium: 4.3 mmol/L (ref 3.5–5.1)
Sodium: 132 mmol/L — ABNORMAL LOW (ref 135–145)

## 2021-12-27 LAB — HEPATITIS B SURFACE ANTIBODY, QUANTITATIVE: Hep B S AB Quant (Post): 7 m[IU]/mL — ABNORMAL LOW (ref >9.9)

## 2021-12-27 MED ORDER — SEVELAMER CARBONATE 2.4 G PO PACK
2.4000 g | PACK | Freq: Three times a day (TID) | ORAL | Status: DC
  Administered 2021-12-28 – 2022-01-03 (×12): 2.4 g via ORAL
  Filled 2021-12-27 (×21): qty 1

## 2021-12-27 MED ORDER — INSULIN ASPART 100 UNIT/ML IJ SOLN
0.0000 [IU] | Freq: Three times a day (TID) | INTRAMUSCULAR | Status: DC
  Administered 2021-12-30 – 2022-01-04 (×4): 1 [IU] via SUBCUTANEOUS
  Administered 2022-01-04: 3 [IU] via SUBCUTANEOUS

## 2021-12-27 NOTE — Progress Notes (Signed)
[name], male   DOB: October 23, 1970, 51 y.o.   MRN: [id number] [facility] Progress Note   Assessment/ Plan:   Chronic RLE wound/Osteomyelitis-followed by Dr. Sharol Given outpatient and awaiting his evaluation here in the hospital to offer opinion on additional management of wound breakdown of the right lower extremity.  He remains on antibiotic coverage with cefepime. ESRD - on HD MWF as an outpatient and underwent hemodialysis yesterday without problems.  No acute indication for dialysis today. Hypertension/volume- Euvolemic on exam with blood pressures appear to be fairly well controlled with ongoing midodrine Anemia of CKD -hemoglobin and hematocrit are currently at goal and he does not have indication for ESA.  No overt blood loss. Secondary Hyperparathyroidism -mildly elevated phosphorus level with calcium level currently acceptable, continue to monitor on renal diet Nutrition - renal diet with fluid restriction.  Subjective:   Continues to have discomfort from the lower extremities from the wound/calciphylaxis   Objective:   BP 118/83 (BP Location: Left Arm)   Pulse 77   Temp 98.9 F (37.2 C) (Oral)   Resp 14   Ht 5\' 10"  (1.778 m)   Wt 102.1 kg   SpO2 95%   BMI 32.28 kg/m   Physical Exam: Gen: Appears uncomfortable resting in bed CVS: Pulse regular rhythm, normal rate, S1 and S2 normal Resp: Clear to auscultation bilaterally, no rales/rhonchi Abd: Soft, obese, nontender, bowel sounds normal Ext: Status post left below-knee amputation with discolored right lower extremity/evidence of CUA and open wound over Achilles tendon area.  Left upper arm AV fistula with palpable thrill  Labs: BMET Recent Labs  Lab 12/25/21 1903 12/26/21 0414 12/27/21 0227  NA 135 132* 132*  K 4.3 5.1 4.3  CL 88* 90* 89*  CO2 28 27 30   GLUCOSE 153* 133* 110*  BUN 56* 62* 40*  CREATININE 7.50* 8.02* 5.87*  CALCIUM 9.7 9.3 9.6  PHOS  --   --  5.4*   CBC Recent  Labs  Lab 12/25/21 1903 12/26/21 0414 12/27/21 0227  WBC 13.2* 15.5* 14.5*  NEUTROABS 10.2* 11.7* 10.6*  HGB 12.7* 12.7* 12.5*  HCT 40.6 38.8* 38.0*  MCV 109.4* 108.1* 106.1*  PLT 343 329 353      Medications:     amLODipine  5 mg Oral QHS   aspirin EC  81 mg Oral Daily   atorvastatin  40 mg Oral QHS   bromocriptine  2.5 mg Oral QHS   buPROPion  300 mg Oral q AM   calcitRIOL  2 mcg Oral Q M,W,F-HD   calcium acetate  1,334 mg Oral TID WC   Chlorhexidine Gluconate Cloth  6 each Topical Q0600   DULoxetine  20 mg Oral BID   heparin  5,000 Units Subcutaneous Q8H   melatonin  10 mg Oral QHS   metoprolol tartrate  25 mg Oral BID   midodrine  10 mg Oral Q M,W,F   pantoprazole  40 mg Oral QHS   tamsulosin  0.4 mg Oral QHS   Elmarie Shiley, MD 12/27/2021, 9:59 AM

## 2021-12-27 NOTE — Plan of Care (Signed)

## 2021-12-27 NOTE — H&P (View-Only) (Signed)
ORTHOPAEDIC CONSULTATION  REQUESTING PHYSICIAN: Nita Sells, MD  Chief Complaint: Ulceration right heel.  HPI: Michael Riggs is a 51 y.o. 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  Patient has undergone limb salvage intervention with skin grafting over the Achilles.  This is healed well however he has had progressive gangrenous changes involving the calcaneus and soft tissue envelope around the calcaneus.  Patient is status post a left transtibial amputation.  Past Medical History:  Diagnosis Date   Anemia    Anemia of chronic disease    Anxiety    ARF (acute renal failure) ([place]) 01/30/2019   Calciphylaxis    Controlled type 2 diabetes mellitus with hyperglycemia, without long-term current use of insulin ([place])    Debility 02/09/2019   Depression    Diabetes mellitus type 2 in obese ([place]) 07/04/2013   Diabetes mellitus with peripheral vascular disease ([place])    type 2 no meds, lost 100 lbs   Dyslipidemia    Dyspnea    inhaler   End stage renal disease ([place])    ESRD (end stage renal disease) on dialysis ([place]) 01/2019   MWFS   ESRD on dialysis [facility])    Essential hypertension    Fluid overload 02/04/2019   GERD (gastroesophageal reflux disease)    Goals of care, counseling/discussion    Habitual alcohol use 07/04/2013   Headache    Hyperkalemia 01/30/2019   Hypertension    Hypoglycemia 01/30/2019   Hypokalemia 01/30/2019   Labile blood pressure    Leukocytosis    Lower GI bleed 05/14/2019   Macrocytic anemia 01/30/2019   Obesity, Class II, BMI 35-39.9, with comorbidity 07/04/2013   Palliative care encounter    PDR (proliferative diabetic retinopathy) ([place]) 12/14/2013   Physical deconditioning    Pressure injury of skin 04/27/2019   Pruritus    Scrotal edema    Scrotal pain    Sleep apnea    uses CPAP   Sleep disturbance    Slow transit constipation    Status post below-knee amputation of left lower extremity ([place])     Stroke ([place])    mini -shown on CT scan   Syphilis 01/2019   Urinary retention    Venous hypertension 09/22/2013   Wound infection 03/28/2019   Past Surgical History:  Procedure Laterality Date   ABDOMINAL AORTOGRAM W/LOWER EXTREMITY N/A 10/15/2021   Procedure: ABDOMINAL AORTOGRAM W/LOWER EXTREMITY;  Surgeon: Broadus John, MD;  Location: [place] [facility];  Service: Cardiovascular;  Laterality: N/A;   AV FISTULA PLACEMENT Left 01/09/2019   Procedure: ARTERIOVENOUS (AV) FISTULA CREATION LEFT UPPER ARM;  Surgeon: [name], MD;  Location: [facility];  Service: Vascular;  Laterality: Left;   [place] Left 07/06/2019   Procedure: BASILIC VEIN TRANSPOSITION SECOND STAGE LEFT ARM;  Surgeon: [name], MD;  Location: [place];  Service: Vascular;  Laterality: Left;   below the knee amputation Left    EYE SURGERY Bilateral    FLEXIBLE SIGMOIDOSCOPY N/A 05/15/2019   Procedure: FLEXIBLE SIGMOIDOSCOPY;  Surgeon: Irving Copas., MD;  Location: [place];  Service: Gastroenterology;  Laterality: N/A;   HEMOSTASIS CLIP PLACEMENT  05/15/2019   Procedure: HEMOSTASIS CLIP PLACEMENT;  Surgeon: Irving Copas., MD;  Location: [place];  Service: Gastroenterology;;   HOT HEMOSTASIS N/A 05/15/2019   Procedure: HOT HEMOSTASIS (ARGON PLASMA COAGULATION/BICAP);  Surgeon: Irving Copas., MD;  Location: [place];  Service: Gastroenterology;  Laterality: N/A;   I &  D EXTREMITY Right 10/10/2021   Procedure: RIGHT ACHILLES DEBRIDEMENT AND TISSUE GRAFT;  Surgeon: [name], MD;  Location: [facility] [place] [facility];  Service: Orthopedics;  Laterality: Right;   IR FLUORO GUIDE CV LINE RIGHT  01/31/2019   IR FLUORO GUIDE CV LINE RIGHT  02/03/2019   IR THORACENTESIS ASP PLEURAL SPACE W/IMG GUIDE  11/08/2019   IR US GUIDE VASC ACCESS RIGHT  01/31/2019   IR US GUIDE VASC ACCESS RIGHT  02/03/2019   PERIPHERAL VASCULAR BALLOON ANGIOPLASTY  10/15/2021   Procedure: PERIPHERAL VASCULAR BALLOON  ANGIOPLASTY;  Surgeon: Broadus John, MD;  Location: [place] [facility];  Service: Cardiovascular;;  rt sfa and trifurcation   SCLEROTHERAPY  05/15/2019   Procedure: SCLEROTHERAPY;  Surgeon: Mansouraty, Telford Nab., MD;  Location: [facility];  Service: Gastroenterology;;   Social History   Socioeconomic History   Marital status: Divorced    Spouse name: Not on file   Number of children: Not on file   Years of education: Not on file   Highest education level: Not on file  Occupational History   Not on file  Tobacco Use   Smoking status: Former    Types: Cigarettes    Start date: 11/18/2018    Quit date: 01/18/2019    Years since quitting: 2.9    Passive exposure: Never   Smokeless tobacco: Never   Tobacco comments:    pt stated got bored with it  Vaping Use   Vaping Use: Never used  Substance and Sexual Activity   Alcohol use: Not Currently    Comment: heavy drinker in the past, none since 07/19/18   Drug use: Not Currently   Sexual activity: Not Currently  Other Topics Concern   Not on file  Social History Narrative   Not on file   Social Determinants of Health   Financial Resource Strain: Not on file  Food Insecurity: Not on file  Transportation Needs: Not on file  Physical Activity: Not on file  Stress: Not on file  Social Connections: Not on file   Family History  Problem Relation Age of Onset   Diabetes Mother    Multiple myeloma Mother    - negative except otherwise stated in the family history section Allergies  Allergen Reactions   Tape Itching    Prefers silk tape over regular tape   Prior to Admission medications   Medication Sig Start Date End Date Taking? Authorizing Provider  acetaminophen (TYLENOL) 500 MG tablet Take 1,000 mg by mouth every 6 (six) hours as needed for mild pain.   Yes [provider]  albuterol (VENTOLIN HFA) 108 (90 Base) MCG/ACT inhaler Inhale 2 puffs into the lungs every 6 (six) hours as needed for wheezing or  shortness of breath.   Yes [provider]  amLODipine (NORVASC) 5 MG tablet Take 5 mg by mouth at bedtime.    Yes [provider]  aspirin 81 MG EC tablet Take 1 tablet (81 mg total) by mouth daily. Swallow whole. 10/17/21  Yes Wells Guiles, DO  atorvastatin (LIPITOR) 40 MG tablet Take 1 tablet (40 mg total) by mouth daily. Patient taking differently: Take 40 mg by mouth at bedtime. 03/13/19  Yes Angiulli, Lavon Paganini, PA-C  bromocriptine (PARLODEL) 2.5 MG tablet TAKE 1 TABLET BY MOUTH AT BEDTIME. Patient taking differently: Take 2.5 mg by mouth at bedtime. 10/29/21  Yes Renato Shin, MD  buPROPion (WELLBUTRIN XL) 300 MG 24 hr tablet Take 1 tablet (300 mg total) by mouth  in the morning. 10/16/21  Yes Wells Guiles, DO  calcitRIOL (ROCALTROL) 0.5 MCG capsule Take 4 capsules (2 mcg total) by mouth every Monday, Wednesday, and Friday with hemodialysis. 10/17/21  Yes Wells Guiles, DO  clopidogrel (PLAVIX) 75 MG tablet Take 1 tablet (75 mg total) by mouth daily with breakfast. 10/16/21  Yes Dagoberto Ligas, PA-C  DULoxetine (CYMBALTA) 20 MG capsule Take 20 mg by mouth 2 (two) times daily. 12/25/21  Yes [provider]  ferric citrate (AURYXIA) 1 GM 210 MG(Fe) tablet Take 630 mg by mouth 3 (three) times daily with meals. 03/12/21  Yes [provider]  HYDROmorphone (DILAUDID) 4 MG tablet Take 4 mg by mouth every 8 (eight) hours as needed for moderate pain. 12/25/21  Yes [provider]  ipratropium-albuterol (DUONEB) 0.5-2.5 (3) MG/3ML SOLN Take 3 mLs by nebulization every 6 (six) hours as needed (For shortness of breath).   Yes [provider]  Melatonin 10 MG TABS Take 10 mg by mouth at bedtime.   Yes [provider]  metoprolol tartrate (LOPRESSOR) 25 MG tablet Take 1 tablet (25 mg total) by mouth 2 (two) times daily. 10/16/21  Yes Wells Guiles, DO  midodrine (PROAMATINE) 10 MG tablet Take 10 mg by mouth every Monday, Wednesday, and Friday.  09/17/20  Yes [provider]  ondansetron (ZOFRAN) 4 MG tablet Take 4 mg by mouth daily as needed for vomiting. 01/01/21  Yes [provider]  pantoprazole (PROTONIX) 40 MG tablet Take 1 tablet (40 mg total) by mouth daily. Patient taking differently: Take 40 mg by mouth at bedtime. 03/13/19  Yes Angiulli, Lavon Paganini, PA-C  silver sulfADIAZINE (SILVADENE) 1 % cream Apply 1 application. topically daily. Apply to affected area daily plus dry dressing 12/11/21  Yes [name], MD  tamsulosin (FLOMAX) 0.4 MG CAPS capsule Take 1 capsule (0.4 mg total) by mouth daily after breakfast. Patient taking differently: Take 0.4 mg by mouth at bedtime. 03/13/19  Yes Angiulli, Lavon Paganini, PA-C  vitamin C (ASCORBIC ACID) 500 MG tablet Take 500 mg by mouth at bedtime.   Yes [provider]  oxycodone (OXY-IR) 5 MG capsule Take 1 capsule (5 mg total) by mouth every 6 (six) hours as needed. Patient not taking: Reported on 12/26/2021 07/06/19   Dagoberto Ligas, PA-C   DG Ankle Complete Right  Result Date: 12/25/2021 CLINICAL DATA:  Soft tissue wound along the plantar aspect of the right foot with decreased healing, initial encounter EXAM: RIGHT ANKLE - COMPLETE 3+ VIEW COMPARISON:  10/06/2021 FINDINGS: Previously seen lucency within the distal tibia is no longer identified. Soft tissue wound is noted along the superior aspect of the calcaneus near the Achilles insertion. This is new from the prior exam. There are rows of changes in this region consistent with osteomyelitis. Tarsal degenerative changes are noted. IMPRESSION: New nonhealing wound along the posterior aspect of the ankle with erosive changes along the posterosuperior calcaneus consistent with osteomyelitis. Electronically Signed   By: Inez Catalina M.D.   On: 12/25/2021 19:38   - pertinent xrays, CT, MRI studies were reviewed and independently interpreted  Positive ROS: All other systems have been reviewed and were otherwise negative with  the exception of those mentioned in the HPI and as above.  Physical Exam: General: Alert, no acute distress Psychiatric: Patient is competent for consent with normal mood and affect Lymphatic: No axillary or cervical lymphadenopathy Cardiovascular: No pedal edema Respiratory: No cyanosis, no use of accessory musculature GI: No organomegaly, abdomen  is soft and non-tender    Images:  _0 @  Labs:  Lab Results  Component Value Date   HGBA1C 6.2 (H) 10/08/2021   HGBA1C 5.3 05/16/2019   HGBA1C 5.0 01/31/2019   ESRSEDRATE 86 (H) 12/26/2021   ESRSEDRATE >140 (H) 03/28/2019   CRP 19.4 (H) 03/28/2019   REPTSTATUS PENDING 12/26/2021   GRAMSTAIN  10/10/2021    NO WBC SEEN FEW GRAM NEGATIVE RODS RARE GRAM POSITIVE COCCI    CULT GRAM NEGATIVE RODS 12/26/2021   LABORGA MORGANELLA MORGANII 10/10/2021   LABORGA SERRATIA MARCESCENS 10/10/2021    Lab Results  Component Value Date   ALBUMIN 3.0 (L) 12/27/2021   ALBUMIN 3.2 (L) 12/25/2021   ALBUMIN 3.1 (L) 10/17/2021   PREALBUMIN 9.1 (L) 03/28/2019        Latest Ref Rng & Units 12/27/2021    2:27 AM 12/26/2021    4:14 AM 12/25/2021    7:03 PM  CBC EXTENDED  WBC 4.0 - 10.5 K/uL 14.5  15.5  13.2   RBC 4.22 - 5.81 MIL/uL 3.58  3.59  3.71   Hemoglobin 13.0 - 17.0 g/dL 12.5  12.7  12.7   HCT 39.0 - 52.0 % 38.0  38.8  40.6   Platelets 150 - 400 K/uL 353  329  343   NEUT# 1.7 - 7.7 K/uL 10.6  11.7  10.2   Lymph# 0.7 - 4.0 K/uL 1.7  1.8  1.4     Neurologic: Patient does not have protective sensation bilateral lower extremities.   MUSCULOSKELETAL:   Skin: Examination there is necrotic skin around the calcaneus there is exposed bone that is dry and desiccated.  The skin graft over the Achilles has healed nicely.  Patient's white cell count is 14.5.  Hemoglobin 12.5.  Albumin 3.0.  Hemoglobin A1c 6.2 with a sed rate of 86.  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  Plan: Patient has failed limb salvage intervention on the right.  I have recommended proceeding with a transtibial amputation on the right.  Patient states he understands wished to proceed we will set this up for Wednesday.  Thank you for the consult and the opportunity to see Mr. Amadeo Garnet, MD [place] [phone] 11:29 AM

## 2021-12-27 NOTE — Progress Notes (Signed)
PROGRESS NOTE   Michael Riggs  YBO:[id number] DOB: Mar 05, 1971 DOA: 12/25/2021 PCP: [name], MD   Brief Narrative:   51 year old Hispanic male ESRD MWF complicated by neuropathy left BKA, chronic hypotension requiring midodrine BPH and LUTS OSA on CPAP, BMI >30 and OSA on meds CPAP DM controlled TY HTN, HLD Prior bleeding rectal ulcers in 2020 (flex sig) Calciphylaxis with his lower extremity wounds and stage II decubiti in the past   Most recent hospitalization 3/20-found to have infected chronic right lower extremity wound following [place] Staph aureus possible distal tib fib fracture--work-up showed noncompressible RLE arteries-had right Achilles debridement and application of Kerecis graft on 10/10/2021 Also underwent aortogram showed angioplasty of distal SFA proximal anterior tibial as well as tibial peroneal trunk with DES was placed-patient was placed on 6 weeks of IV antibiotics secondary to involvement of calcaneus with Dr. 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 Sharol Given of orthopedics and patient was told to return on 6/8 Patient instead returned to the emergency room and found to have breakdown of the calcaneus posterolaterally Found to have osteomyelitis of the right calcaneal area Antibiotics were withheld initially  Orthopedics as well as renal was consulted    Hospital-Problem based course  Gangrenous changes to right calcaneum/Achilles tendon Prior angioplasty distal SFA tibio-peroneal trunk with DES placed 10/06/2021 Continue cefepime at this time-leukocytosis about the same Continuing aspirin-Plavix 75 was held from admission continue--atorvastatin 40 at bedtime Patient amenable to amputation 6/14 as per Dr. Verlon Au control with Oxy IR 5  every 6 as needed moderate pain, Dilaudid 4 mg every 8 for severe pain ESRD MWF Severe calciphylaxis Phosphorus slightly up at 5.4-continue Rocaltrol 2 mcg MWF, PhosLo changed to Renvela Get PTH and if >300, would initiate cinacalcet Cannot use IV iron given risk of infection--continue Auryxia 630 x 3 times daily meals Continue midodrine 10 mg MWF at HD DM TY 2 now on HD therefore not requiring hypoglycemic agents Monitor sugars-if above 180 would consider more stringent control to aid healing probably does not require mealtime coverage HTN Monitor for hypotension in the setting of HD, continue amlodipine 5, metoprolol 25 twice daily hyperProlactinemia Continue Parlodel 2.5 at bedtime-outpatient endocrinology input needed [has never been seen by Endocrine--needs set-up with them] 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 BPH LUTS Continue Flomax 0.4 daily  DVT prophylaxis: Heparin Code Status: Full Family Communication: None present at this time Disposition:  Status is: Inpatient Remains inpatient appropriate because:   Will require amputation and will need to set up and then consideration for rehab   Consultants:  Nephrology Orthopedics  Procedures: Multiple  Antimicrobials: Cefepime   Subjective:  Awake coherent No distress--pain mod--doesn't use oxy.  Objective: Vitals:   12/26/21 1927 12/26/21 2000 12/27/21 0413 12/27/21 0833  BP: (!) 134/50 138/90 (!) 130/97 118/83  Pulse: 78 80 74 77  Resp: 15 16 18 14   Temp:  98.5 F (36.9 C) 97.6 F (36.4 C) 98.9 F (37.2 C)  TempSrc:  Oral Oral Oral  SpO2: 100% 100% 98% 95%  Weight:      Height:        Intake/Output Summary (Last 24 hours) at 12/27/2021 1136 Last data filed at 12/27/2021 0100 Gross per 24 hour  Intake --  Output  1 ml  Net -1 ml   Filed Weights   12/26/21 0807  Weight: 102.1 kg    Examination:  Eomi ncat no ict no  pallor-thick neck mallampati 4 S1 s2 no m/r/g Fistula in LUE Abd obese L Bka R wounds covered  Data Reviewed: personally reviewed   CBC    Component Value Date/Time   WBC 14.5 (H) 12/27/2021 0227   RBC 3.58 (L) 12/27/2021 0227   HGB 12.5 (L) 12/27/2021 0227   HCT 38.0 (L) 12/27/2021 0227   PLT 353 12/27/2021 0227   MCV 106.1 (H) 12/27/2021 0227   MCH 34.9 (H) 12/27/2021 0227   MCHC 32.9 12/27/2021 0227   RDW 15.7 (H) 12/27/2021 0227   LYMPHSABS 1.7 12/27/2021 0227   MONOABS 1.3 (H) 12/27/2021 0227   EOSABS 0.2 12/27/2021 0227   BASOSABS 0.1 12/27/2021 0227      Latest Ref Rng & Units 12/27/2021    2:27 AM 12/26/2021    4:14 AM 12/25/2021    7:03 PM  CMP  Glucose 70 - 99 mg/dL 110  133  153   BUN 6 - 20 mg/dL 40  62  56   Creatinine 0.61 - 1.24 mg/dL 5.87  8.02  7.50   Sodium 135 - 145 mmol/L 132  132  135   Potassium 3.5 - 5.1 mmol/L 4.3  5.1  4.3   Chloride 98 - 111 mmol/L 89  90  88   CO2 22 - 32 mmol/L 30  27  28    Calcium 8.9 - 10.3 mg/dL 9.6  9.3  9.7   Total Protein 6.5 - 8.1 g/dL   7.8   Total Bilirubin 0.3 - 1.2 mg/dL   1.3   Alkaline Phos 38 - 126 U/L   212   AST 15 - 41 U/L   42   ALT 0 - 44 U/L   86      Radiology Studies: DG Ankle Complete Right  Result Date: 12/25/2021 CLINICAL DATA:  Soft tissue wound along the plantar aspect of the right foot with decreased healing, initial encounter EXAM: RIGHT ANKLE - COMPLETE 3+ VIEW COMPARISON:  10/06/2021 FINDINGS: Previously seen lucency within the distal tibia is no longer identified. Soft tissue wound is noted along the superior aspect of the calcaneus near the Achilles insertion. This is new from the prior exam. There are rows of changes in this region consistent with osteomyelitis. Tarsal degenerative changes are noted. IMPRESSION: New nonhealing wound along the posterior aspect of the ankle with erosive changes along the posterosuperior calcaneus consistent with osteomyelitis. Electronically Signed   By: Inez Catalina M.D.   On: 12/25/2021 19:38     Scheduled Meds:  amLODipine  5 mg Oral QHS   aspirin EC  81 mg Oral Daily   atorvastatin  40 mg Oral QHS   bromocriptine  2.5 mg Oral QHS   buPROPion  300 mg Oral q AM   calcitRIOL  2 mcg Oral Q M,W,F-HD   calcium acetate  1,334 mg Oral TID WC   Chlorhexidine Gluconate Cloth  6 each Topical Q0600   DULoxetine  20 mg Oral BID   heparin  5,000 Units Subcutaneous Q8H   melatonin  10 mg Oral QHS   metoprolol tartrate  25 mg Oral BID   midodrine  10 mg Oral Q M,W,F   pantoprazole  40 mg Oral QHS   tamsulosin  0.4 mg Oral QHS   Continuous Infusions:  ceFEPime (MAXIPIME) IV 1 g (  12/26/21 2230)     LOS: 1 day   Time spent: Weber, MD [facility] To contact the attending provider between 7A-7P or the covering provider during after hours 7P-7A, please log into the web site [email or web address] and access using universal [place] password for that web site. If you do not have the password, please call the hospital operator.  12/27/2021, 11:36 AM

## 2021-12-27 NOTE — Consult Note (Signed)
 ORTHOPAEDIC CONSULTATION  REQUESTING PHYSICIAN: Samtani, Jai-Gurmukh, MD  Chief Complaint: Ulceration right heel.  HPI: Michael Riggs is a 50 y.o. 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  Patient has undergone limb salvage intervention with skin grafting over the Achilles.  This is healed well however he has had progressive gangrenous changes involving the calcaneus and soft tissue envelope around the calcaneus.  Patient is status post a left transtibial amputation.  Past Medical History:  Diagnosis Date   Anemia    Anemia of chronic disease    Anxiety    ARF (acute renal failure) (HCC) 01/30/2019   Calciphylaxis    Controlled type 2 diabetes mellitus with hyperglycemia, without long-term current use of insulin (HCC)    Debility 02/09/2019   Depression    Diabetes mellitus type 2 in obese (HCC) 07/04/2013   Diabetes mellitus with peripheral vascular disease (HCC)    type 2 no meds, lost 100 lbs   Dyslipidemia    Dyspnea    inhaler   End stage renal disease (HCC)    ESRD (end stage renal disease) on dialysis (HCC) 01/2019   MWFS   ESRD on dialysis (HCC)    Essential hypertension    Fluid overload 02/04/2019   GERD (gastroesophageal reflux disease)    Goals of care, counseling/discussion    Habitual alcohol use 07/04/2013   Headache    Hyperkalemia 01/30/2019   Hypertension    Hypoglycemia 01/30/2019   Hypokalemia 01/30/2019   Labile blood pressure    Leukocytosis    Lower GI bleed 05/14/2019   Macrocytic anemia 01/30/2019   Obesity, Class II, BMI 35-39.9, with comorbidity 07/04/2013   Palliative care encounter    PDR (proliferative diabetic retinopathy) (HCC) 12/14/2013   Physical deconditioning    Pressure injury of skin 04/27/2019   Pruritus    Scrotal edema    Scrotal pain    Sleep apnea    uses CPAP   Sleep disturbance    Slow transit constipation    Status post below-knee amputation of left lower extremity (HCC)     Stroke (HCC)    mini -shown on CT scan   Syphilis 01/2019   Urinary retention    Venous hypertension 09/22/2013   Wound infection 03/28/2019   Past Surgical History:  Procedure Laterality Date   ABDOMINAL AORTOGRAM W/LOWER EXTREMITY N/A 10/15/2021   Procedure: ABDOMINAL AORTOGRAM W/LOWER EXTREMITY;  Surgeon: Robins, Joshua E, MD;  Location: [facility];  Service: Cardiovascular;  Laterality: N/A;   AV FISTULA PLACEMENT Left 01/09/2019   Procedure: ARTERIOVENOUS (AV) FISTULA CREATION LEFT UPPER ARM;  Surgeon: Early, Todd F, MD;  Location: [facility];  Service: Vascular;  Laterality: Left;   BASCILIC VEIN TRANSPOSITION Left 07/06/2019   Procedure: BASILIC VEIN TRANSPOSITION SECOND STAGE LEFT ARM;  Surgeon: Early, Todd F, MD;  Location: [facility];  Service: Vascular;  Laterality: Left;   below the knee amputation Left    EYE SURGERY Bilateral    FLEXIBLE SIGMOIDOSCOPY N/A 05/15/2019   Procedure: FLEXIBLE SIGMOIDOSCOPY;  Surgeon: Mansouraty, Gabriel Jr., MD;  Location: [facility];  Service: Gastroenterology;  Laterality: N/A;   HEMOSTASIS CLIP PLACEMENT  05/15/2019   Procedure: HEMOSTASIS CLIP PLACEMENT;  Surgeon: Mansouraty, Gabriel Jr., MD;  Location: [facility];  Service: Gastroenterology;;   HOT HEMOSTASIS N/A 05/15/2019   Procedure: HOT HEMOSTASIS (ARGON PLASMA COAGULATION/BICAP);  Surgeon: Mansouraty, Gabriel Jr., MD;  Location: [facility];  Service: Gastroenterology;  Laterality: N/A;   I &   D EXTREMITY Right 10/10/2021   Procedure: RIGHT ACHILLES DEBRIDEMENT AND TISSUE GRAFT;  Surgeon: [name], MD;  [facility];  Service: Orthopedics;  Laterality: Right;   IR FLUORO GUIDE CV LINE RIGHT  01/31/2019   IR FLUORO GUIDE CV LINE RIGHT  02/03/2019   IR THORACENTESIS ASP PLEURAL SPACE W/IMG GUIDE  11/08/2019   IR US GUIDE VASC ACCESS RIGHT  01/31/2019   IR US GUIDE VASC ACCESS RIGHT  02/03/2019   PERIPHERAL VASCULAR BALLOON ANGIOPLASTY  10/15/2021   Procedure: PERIPHERAL VASCULAR BALLOON  ANGIOPLASTY;  Surgeon: Broadus John, MD;  Location: [facility];  Service: Cardiovascular;;  rt sfa and trifurcation   SCLEROTHERAPY  05/15/2019   Procedure: SCLEROTHERAPY;  Surgeon: Mansouraty, Telford Nab., MD;  Location: [facility] ENDOSCOPY;  Service: Gastroenterology;;   Social History   Socioeconomic History   Marital status: Divorced    Spouse name: Not on file   Number of children: Not on file   Years of education: Not on file   Highest education level: Not on file  Occupational History   Not on file  Tobacco Use   Smoking status: Former    Types: Cigarettes    Start date: 11/18/2018    Quit date: 01/18/2019    Years since quitting: 2.9    Passive exposure: Never   Smokeless tobacco: Never   Tobacco comments:    pt stated got bored with it  Vaping Use   Vaping Use: Never used  Substance and Sexual Activity   Alcohol use: Not Currently    Comment: heavy drinker in the past, none since 07/19/18   Drug use: Not Currently   Sexual activity: Not Currently  Other Topics Concern   Not on file  Social History Narrative   Not on file   Social Determinants of Health   Financial Resource Strain: Not on file  Food Insecurity: Not on file  Transportation Needs: Not on file  Physical Activity: Not on file  Stress: Not on file  Social Connections: Not on file   Family History  Problem Relation Age of Onset   Diabetes Mother    Multiple myeloma Mother    - negative except otherwise stated in the family history section Allergies  Allergen Reactions   Tape Itching    Prefers silk tape over regular tape   Prior to Admission medications   Medication Sig Start Date End Date Taking? Authorizing Provider  acetaminophen (TYLENOL) 500 MG tablet Take 1,000 mg by mouth every 6 (six) hours as needed for mild pain.   Yes [provider]  albuterol (VENTOLIN HFA) 108 (90 Base) MCG/ACT inhaler Inhale 2 puffs into the lungs every 6 (six) hours as needed for wheezing or  shortness of breath.   Yes [provider]  amLODipine (NORVASC) 5 MG tablet Take 5 mg by mouth at bedtime.    Yes [provider]  aspirin 81 MG EC tablet Take 1 tablet (81 mg total) by mouth daily. Swallow whole. 10/17/21  Yes Wells Guiles, DO  atorvastatin (LIPITOR) 40 MG tablet Take 1 tablet (40 mg total) by mouth daily. Patient taking differently: Take 40 mg by mouth at bedtime. 03/13/19  Yes Angiulli, Lavon Paganini, PA-C  bromocriptine (PARLODEL) 2.5 MG tablet TAKE 1 TABLET BY MOUTH AT BEDTIME. Patient taking differently: Take 2.5 mg by mouth at bedtime. 10/29/21  Yes Renato Shin, MD  buPROPion (WELLBUTRIN XL) 300 MG 24 hr tablet Take 1 tablet (300 mg total) by mouth  in the morning. 10/16/21  Yes Wells Guiles, DO  calcitRIOL (ROCALTROL) 0.5 MCG capsule Take 4 capsules (2 mcg total) by mouth every Monday, Wednesday, and Friday with hemodialysis. 10/17/21  Yes Wells Guiles, DO  clopidogrel (PLAVIX) 75 MG tablet Take 1 tablet (75 mg total) by mouth daily with breakfast. 10/16/21  Yes Dagoberto Ligas, PA-C  DULoxetine (CYMBALTA) 20 MG capsule Take 20 mg by mouth 2 (two) times daily. 12/25/21  Yes [provider]  ferric citrate (AURYXIA) 1 GM 210 MG(Fe) tablet Take 630 mg by mouth 3 (three) times daily with meals. 03/12/21  Yes [provider]  HYDROmorphone (DILAUDID) 4 MG tablet Take 4 mg by mouth every 8 (eight) hours as needed for moderate pain. 12/25/21  Yes [provider]  ipratropium-albuterol (DUONEB) 0.5-2.5 (3) MG/3ML SOLN Take 3 mLs by nebulization every 6 (six) hours as needed (For shortness of breath).   Yes [provider]  Melatonin 10 MG TABS Take 10 mg by mouth at bedtime.   Yes [provider]  metoprolol tartrate (LOPRESSOR) 25 MG tablet Take 1 tablet (25 mg total) by mouth 2 (two) times daily. 10/16/21  Yes Wells Guiles, DO  midodrine (PROAMATINE) 10 MG tablet Take 10 mg by mouth every Monday, Wednesday, and Friday.  09/17/20  Yes [provider]  ondansetron (ZOFRAN) 4 MG tablet Take 4 mg by mouth daily as needed for vomiting. 01/01/21  Yes [provider]  pantoprazole (PROTONIX) 40 MG tablet Take 1 tablet (40 mg total) by mouth daily. Patient taking differently: Take 40 mg by mouth at bedtime. 03/13/19  Yes Angiulli, Lavon Paganini, PA-C  silver sulfADIAZINE (SILVADENE) 1 % cream Apply 1 application. topically daily. Apply to affected area daily plus dry dressing 12/11/21  Yes [name], MD  tamsulosin (FLOMAX) 0.4 MG CAPS capsule Take 1 capsule (0.4 mg total) by mouth daily after breakfast. Patient taking differently: Take 0.4 mg by mouth at bedtime. 03/13/19  Yes Angiulli, Lavon Paganini, PA-C  vitamin C (ASCORBIC ACID) 500 MG tablet Take 500 mg by mouth at bedtime.   Yes [provider]  oxycodone (OXY-IR) 5 MG capsule Take 1 capsule (5 mg total) by mouth every 6 (six) hours as needed. Patient not taking: Reported on 12/26/2021 07/06/19   Dagoberto Ligas, PA-C   DG Ankle Complete Right  Result Date: 12/25/2021 CLINICAL DATA:  Soft tissue wound along the plantar aspect of the right foot with decreased healing, initial encounter EXAM: RIGHT ANKLE - COMPLETE 3+ VIEW COMPARISON:  10/06/2021 FINDINGS: Previously seen lucency within the distal tibia is no longer identified. Soft tissue wound is noted along the superior aspect of the calcaneus near the Achilles insertion. This is new from the prior exam. There are rows of changes in this region consistent with osteomyelitis. Tarsal degenerative changes are noted. IMPRESSION: New nonhealing wound along the posterior aspect of the ankle with erosive changes along the posterosuperior calcaneus consistent with osteomyelitis. Electronically Signed   By: Inez Catalina M.D.   On: 12/25/2021 19:38   - pertinent xrays, CT, MRI studies were reviewed and independently interpreted  Positive ROS: All other systems have been reviewed and were otherwise negative with  the exception of those mentioned in the HPI and as above.  Physical Exam: General: Alert, no acute distress Psychiatric: Patient is competent for consent with normal mood and affect Lymphatic: No axillary or cervical lymphadenopathy Cardiovascular: No pedal edema Respiratory: No cyanosis, no use of accessory musculature GI: No organomegaly, abdomen  is soft and non-tender    Images:  _0 @  Labs:  Lab Results  Component Value Date   HGBA1C 6.2 (H) 10/08/2021   HGBA1C 5.3 05/16/2019   HGBA1C 5.0 01/31/2019   ESRSEDRATE 86 (H) 12/26/2021   ESRSEDRATE >140 (H) 03/28/2019   CRP 19.4 (H) 03/28/2019   REPTSTATUS PENDING 12/26/2021   GRAMSTAIN  10/10/2021    NO WBC SEEN FEW GRAM NEGATIVE RODS RARE GRAM POSITIVE COCCI    CULT GRAM NEGATIVE RODS 12/26/2021   LABORGA MORGANELLA MORGANII 10/10/2021   LABORGA SERRATIA MARCESCENS 10/10/2021    Lab Results  Component Value Date   ALBUMIN 3.0 (L) 12/27/2021   ALBUMIN 3.2 (L) 12/25/2021   ALBUMIN 3.1 (L) 10/17/2021   PREALBUMIN 9.1 (L) 03/28/2019        Latest Ref Rng & Units 12/27/2021    2:27 AM 12/26/2021    4:14 AM 12/25/2021    7:03 PM  CBC EXTENDED  WBC 4.0 - 10.5 K/uL 14.5  15.5  13.2   RBC 4.22 - 5.81 MIL/uL 3.58  3.59  3.71   Hemoglobin 13.0 - 17.0 g/dL 12.5  12.7  12.7   HCT 39.0 - 52.0 % 38.0  38.8  40.6   Platelets 150 - 400 K/uL 353  329  343   NEUT# 1.7 - 7.7 K/uL 10.6  11.7  10.2   Lymph# 0.7 - 4.0 K/uL 1.7  1.8  1.4     Neurologic: Patient does not have protective sensation bilateral lower extremities.   MUSCULOSKELETAL:   Skin: Examination there is necrotic skin around the calcaneus there is exposed bone that is dry and desiccated.  The skin graft over the Achilles has healed nicely.  Patient's white cell count is 14.5.  Hemoglobin 12.5.  Albumin 3.0.  Hemoglobin A1c 6.2 with a sed rate of 86.  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  Plan: Patient has failed limb salvage intervention on the right.  I have recommended proceeding with a transtibial amputation on the right.  Patient states he understands wished to proceed we will set this up for Wednesday.  Thank you for the consult and the opportunity to see Mr. Michael Garnet, MD [place] [phone] 11:29 AM

## 2021-12-28 DIAGNOSIS — M86171 Other acute osteomyelitis, right ankle and foot: Secondary | ICD-10-CM | POA: Diagnosis not present

## 2021-12-28 LAB — GLUCOSE, CAPILLARY
Glucose-Capillary: 124 mg/dL — ABNORMAL HIGH (ref 70–99)
Glucose-Capillary: 155 mg/dL — ABNORMAL HIGH (ref 70–99)
Glucose-Capillary: 122 mg/dL — ABNORMAL HIGH (ref 70–99)
Glucose-Capillary: 128 mg/dL — ABNORMAL HIGH (ref 70–99)

## 2021-12-28 LAB — CULTURE, BLOOD (ROUTINE X 2): Special Requests: ADEQUATE

## 2021-12-28 NOTE — Progress Notes (Signed)
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 Sharol Given of orthopedics and patient was told to return on 6/8 Patient instead returned to the emergency room and found to have breakdown of the calcaneus posterolaterally Found to have osteomyelitis of the right calcaneal area Antibiotics were withheld initially  Orthopedics as well as renal was consulted    Hospital-Problem based course  Gangrenous changes to right calcaneum/Achilles tendon Prior angioplasty distal SFA tibio-peroneal trunk with DES placed 10/06/2021 Continue cefepime at this time-leukocytosis about the same Continuing aspirin-Plavix 75 was held from admission continue--atorvastatin 40 at bedtime amputation 6/14 as per Dr. Verlon Au control Dilaudid 4 mg every 8  ESRD  MWF Severe calciphylaxis continue Rocaltrol 2 mcg MWF, started Renvela Get PTH and if >300, would initiate cinacalcet Cannot use IV iron given risk of infection--continue Auryxia 630 x 3 times daily meals Continue midodrine 10 mg MWF at HD DM TY 2 now on HD therefore not requiring hypoglycemic agents CBg 120-155--probably does not require mealtime coverage HTN Bp acceptable--continue amlodipine 5, metoprolol 25 twice daily hyperProlactinemia Continue Parlodel 2.5 at bedtime-outpatient endocrinology input needed [has never been seen by Endocrine--needs set-up with them] 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 BPH LUTS Continue Flomax 0.4 daily OSA Using oxygen as doesn't like our mask--cont O2 until d/c home  DVT prophylaxis: Heparin Code Status: Full Family Communication: None present at this time Disposition:  Status is: Inpatient Remains inpatient appropriate because:   Will require amputation --may require short term rehab   Consultants:  Nephrology Orthopedics  Procedures: Multiple  Antimicrobials: Cefepime   Subjective:  Watching TV Comfortable--pain only mod  Objective: Vitals:   12/27/21 1547 12/27/21 2033 12/28/21 0614 12/28/21 1121  BP: 121/83 (!) 119/93 109/79 122/79  Pulse: 68 70 73 67  Resp: 20 18 15 12   Temp: 98.8 F (37.1 C) 97.6 F (36.4 C)  97.6 F (36.4 C)  TempSrc: Oral Oral  Oral  SpO2: 98% 96% 100% 100%  Weight:      Height:        Intake/Output Summary (Last 24 hours) at 12/28/2021 1604 Last data filed at 12/28/2021 0928 Gross per 24 hour  Intake 687 ml  Output --  Net 687 ml    Filed Weights   12/26/21 0807  Weight: 102.1 kg    Examination:  Eomi ncat no ict no pallor-mallampati 4 S1 s2 no m/r/g Fistula  in LUE Abd obese L Bka R wounds on RLE remain covered  Data Reviewed: personally reviewed   CBC    Component Value Date/Time   WBC 14.5 (H)  12/27/2021 0227   RBC 3.58 (L) 12/27/2021 0227   HGB 12.5 (L) 12/27/2021 0227   HCT 38.0 (L) 12/27/2021 0227   PLT 353 12/27/2021 0227   MCV 106.1 (H) 12/27/2021 0227   MCH 34.9 (H) 12/27/2021 0227   MCHC 32.9 12/27/2021 0227   RDW 15.7 (H) 12/27/2021 0227   LYMPHSABS 1.7 12/27/2021 0227   MONOABS 1.3 (H) 12/27/2021 0227   EOSABS 0.2 12/27/2021 0227   BASOSABS 0.1 12/27/2021 0227      Latest Ref Rng & Units 12/27/2021    2:27 AM 12/26/2021    4:14 AM 12/25/2021    7:03 PM  CMP  Glucose 70 - 99 mg/dL 110  133  153   BUN 6 - 20 mg/dL 40  62  56   Creatinine 0.61 - 1.24 mg/dL 5.87  8.02  7.50   Sodium 135 - 145 mmol/L 132  132  135   Potassium 3.5 - 5.1 mmol/L 4.3  5.1  4.3   Chloride 98 - 111 mmol/L 89  90  88   CO2 22 - 32 mmol/L 30  27  28    Calcium 8.9 - 10.3 mg/dL 9.6  9.3  9.7   Total Protein 6.5 - 8.1 g/dL   7.8   Total Bilirubin 0.3 - 1.2 mg/dL   1.3   Alkaline Phos 38 - 126 U/L   212   AST 15 - 41 U/L   42   ALT 0 - 44 U/L   86      Radiology Studies: No results found.   Scheduled Meds:  amLODipine  5 mg Oral QHS   aspirin EC  81 mg Oral Daily   atorvastatin  40 mg Oral QHS   bromocriptine  2.5 mg Oral QHS   buPROPion  300 mg Oral q AM   calcitRIOL  2 mcg Oral Q M,W,F-HD   Chlorhexidine Gluconate Cloth  6 each Topical Q0600   DULoxetine  20 mg Oral BID   heparin  5,000 Units Subcutaneous Q8H   insulin aspart  0-6 Units Subcutaneous TID WC   melatonin  10 mg Oral QHS   metoprolol tartrate  25 mg Oral BID   midodrine  10 mg Oral Q M,W,F   pantoprazole  40 mg Oral QHS   sevelamer carbonate  2.4 g Oral TID WC   tamsulosin  0.4 mg Oral QHS   Continuous Infusions:  ceFEPime (MAXIPIME) IV 1 g (12/28/21 1259)     LOS: 2 days   Time spent: 79  Nita Sells, MD [facility] To contact the attending provider between 7A-7P or the covering provider during after hours 7P-7A, please log into the web site [email or web address] and access using universal [facility] password for that web site. If you do not have the password, please call the hospital operator.  12/28/2021, 4:04 PM

## 2021-12-28 NOTE — Progress Notes (Signed)
[name], male   DOB: 10-Sep-1970, 51 y.o.   MRN: [id number] [facility] Progress Note   Assessment/ Plan:   Chronic RLE wound/Osteomyelitis-seen yesterday by Dr. Sharol Given for osteomyelitis/gangrene of right heel that has failed efforts at limb salvage and plans noted for right BKA on 6/15.  He remains on antibiotic coverage with cefepime. ESRD - on HD MWF as an outpatient and will order for his next hemodialysis tomorrow.  No acute indication for dialysis today. Hypertension/volume- Euvolemic on exam with blood pressures appear to be fairly well controlled with ongoing midodrine Anemia of CKD -hemoglobin and hematocrit are currently at goal and he does not have indication for ESA.  No overt blood loss. Secondary Hyperparathyroidism -calcium and phosphorus level both acceptable with ongoing sevelamer, monitor after transition to regular diet from renal diet. Nutrition -we will transition from a renal diet to regular diet to help improve his choices.  Subjective:   Disappointed that he will have a right below-knee amputation but understands the necessity.  Requests regular diet rather than renal diet to help improve selection.   Objective:   BP 109/79   Pulse 73   Temp 97.6 F (36.4 C) (Oral)   Resp 15   Ht 5\' 10"  (1.778 m)   Wt 102.1 kg   SpO2 100%   BMI 32.28 kg/m   Physical Exam: Gen: Comfortably resting in bed, nurse at bedside CVS: Pulse regular rhythm, normal rate, S1 and S2 normal Resp: Clear to auscultation bilaterally, no rales/rhonchi Abd: Soft, obese, nontender, bowel sounds normal Ext: Status post left below-knee amputation with discolored right lower extremity/evidence of CUA and open wound over Achilles tendon area.  Left upper arm AV fistula with palpable thrill  Labs: BMET Recent Labs  Lab 12/25/21 1903 12/26/21 0414 12/27/21 0227  NA 135 132* 132*  K 4.3 5.1 4.3  CL 88* 90* 89*  CO2 28 27 30   GLUCOSE 153* 133* 110*  BUN  56* 62* 40*  CREATININE 7.50* 8.02* 5.87*  CALCIUM 9.7 9.3 9.6  PHOS  --   --  5.4*   CBC Recent Labs  Lab 12/25/21 1903 12/26/21 0414 12/27/21 0227  WBC 13.2* 15.5* 14.5*  NEUTROABS 10.2* 11.7* 10.6*  HGB 12.7* 12.7* 12.5*  HCT 40.6 38.8* 38.0*  MCV 109.4* 108.1* 106.1*  PLT 343 329 353      Medications:     amLODipine  5 mg Oral QHS   aspirin EC  81 mg Oral Daily   atorvastatin  40 mg Oral QHS   bromocriptine  2.5 mg Oral QHS   buPROPion  300 mg Oral q AM   calcitRIOL  2 mcg Oral Q M,W,F-HD   Chlorhexidine Gluconate Cloth  6 each Topical Q0600   DULoxetine  20 mg Oral BID   heparin  5,000 Units Subcutaneous Q8H   insulin aspart  0-6 Units Subcutaneous TID WC   melatonin  10 mg Oral QHS   metoprolol tartrate  25 mg Oral BID   midodrine  10 mg Oral Q M,W,F   pantoprazole  40 mg Oral QHS   sevelamer carbonate  2.4 g Oral TID WC   tamsulosin  0.4 mg Oral QHS   Elmarie Shiley, MD 12/28/2021, 9:26 AM

## 2021-12-29 DIAGNOSIS — M86171 Other acute osteomyelitis, right ankle and foot: Secondary | ICD-10-CM | POA: Diagnosis not present

## 2021-12-29 LAB — RENAL FUNCTION PANEL
Albumin: 2.8 g/dL — ABNORMAL LOW (ref 3.5–5.0)
Anion gap: 19 — ABNORMAL HIGH (ref 5–15)
BUN: 81 mg/dL — ABNORMAL HIGH (ref 6–20)
CO2: 24 mmol/L (ref 22–32)
Calcium: 9.4 mg/dL (ref 8.9–10.3)
Chloride: 85 mmol/L — ABNORMAL LOW (ref 98–111)
Creatinine, Ser: 8.77 mg/dL — ABNORMAL HIGH (ref 0.61–1.24)
GFR, Estimated: 7 mL/min — ABNORMAL LOW (ref >60)
Glucose, Bld: 113 mg/dL — ABNORMAL HIGH (ref 70–99)
Phosphorus: 8.9 mg/dL — ABNORMAL HIGH (ref 2.5–4.6)
Potassium: 5.1 mmol/L (ref 3.5–5.1)
Sodium: 128 mmol/L — ABNORMAL LOW (ref 135–145)

## 2021-12-29 LAB — GLUCOSE, CAPILLARY
Glucose-Capillary: 125 mg/dL — ABNORMAL HIGH (ref 70–99)
Glucose-Capillary: 150 mg/dL — ABNORMAL HIGH (ref 70–99)
Glucose-Capillary: 133 mg/dL — ABNORMAL HIGH (ref 70–99)

## 2021-12-29 MED ORDER — CALCITRIOL 0.5 MCG PO CAPS
1.0000 ug | ORAL_CAPSULE | ORAL | Status: DC
  Administered 2021-12-29 – 2022-01-02 (×3): 1 ug via ORAL
  Filled 2021-12-29 (×5): qty 2

## 2021-12-29 MED ORDER — UREA 10 % EX LOTN
TOPICAL_LOTION | Freq: Two times a day (BID) | CUTANEOUS | Status: DC
  Filled 2021-12-29 (×3): qty 237

## 2021-12-29 MED ORDER — CALCITRIOL 0.5 MCG PO CAPS: 1.5000 ug | ORAL_CAPSULE | ORAL | Status: DC

## 2021-12-29 NOTE — Progress Notes (Signed)
[facility] Progress Note   Subjective:   Reports mild SOB this AM. Denies CP, palpitations, dizziness, abdominal pain and nausea. Noted bandage on L index finger, says he has had an ulcer there for about 6 months.   Objective Vitals:   12/28/21 1121 12/28/21 2017 12/29/21 0522 12/29/21 0743  BP: 122/79 116/79 106/78 108/73  Pulse: 67 66 66 66  Resp: 12 16 18    Temp: 97.6 F (36.4 C) 97.8 F (36.6 C) 97.6 F (36.4 C) (!) 97.5 F (36.4 C)  TempSrc: Oral Oral Oral Oral  SpO2: 100% 100% 97% 97%  Weight:      Height:       Physical Exam General: WDWN male, alert and in NAD Heart: RRR, no murmurs, rubs or gallops Lungs: CTA bilaterally without wheezing, rhonchi or rales Abdomen: Soft, non-distended, +BS Extremities: L BKA, RLE wrapped foot to ankle, no pitting edema Dialysis Access: LUE AVF + t/b  Additional Objective Labs: Basic Metabolic Panel: Recent Labs  Lab 12/26/21 0414 12/27/21 0227 12/29/21 0208  NA 132* 132* 128*  K 5.1 4.3 5.1  CL 90* 89* 85*  CO2 27 30 24   GLUCOSE 133* 110* 113*  BUN 62* 40* 81*  CREATININE 8.02* 5.87* 8.77*  CALCIUM 9.3 9.6 9.4  PHOS  --  5.4* 8.9*   Liver Function Tests: Recent Labs  Lab 12/25/21 1903 12/27/21 0227 12/29/21 0208  AST 42*  --   --   ALT 86*  --   --   ALKPHOS 212*  --   --   BILITOT 1.3*  --   --   PROT 7.8  --   --   ALBUMIN 3.2* 3.0* 2.8*   No results for input(s): "LIPASE", "AMYLASE" in the last 168 hours. CBC: Recent Labs  Lab 12/25/21 1903 12/26/21 0414 12/27/21 0227  WBC 13.2* 15.5* 14.5*  NEUTROABS 10.2* 11.7* 10.6*  HGB 12.7* 12.7* 12.5*  HCT 40.6 38.8* 38.0*  MCV 109.4* 108.1* 106.1*  PLT 343 329 353   Blood Culture    Component Value Date/Time   SDES BLOOD RIGHT FOREARM 12/26/2021 0040   SPECREQUEST  12/26/2021 0040    BOTTLES DRAWN AEROBIC AND ANAEROBIC Blood Culture adequate volume   CULT MORGANELLA MORGANII (A) 12/26/2021 0040   REPTSTATUS 12/28/2021 FINAL  12/26/2021 0040    Cardiac Enzymes: No results for input(s): "CKTOTAL", "CKMB", "CKMBINDEX", "TROPONINI" in the last 168 hours. CBG: Recent Labs  Lab 12/28/21 0744 12/28/21 1119 12/28/21 1653 12/28/21 2137 12/29/21 0848  GLUCAP 124* 155* 122* 128* 125*   Iron Studies: No results for input(s): "IRON", "TIBC", "TRANSFERRIN", "FERRITIN" in the last 72 hours. @lablastinr3 @ Studies/Results: No results found. Medications:  ceFEPime (MAXIPIME) IV Stopped (12/28/21 1359)    amLODipine  5 mg Oral QHS   aspirin EC  81 mg Oral Daily   atorvastatin  40 mg Oral QHS   bromocriptine  2.5 mg Oral QHS   buPROPion  300 mg Oral q AM   calcitRIOL  2 mcg Oral Q M,W,F-HD   Chlorhexidine Gluconate Cloth  6 each Topical Q0600   DULoxetine  20 mg Oral BID   heparin  5,000 Units Subcutaneous Q8H   insulin aspart  0-6 Units Subcutaneous TID WC   melatonin  10 mg Oral QHS   metoprolol tartrate  25 mg Oral BID   midodrine  10 mg Oral Q M,W,F   pantoprazole  40 mg Oral QHS   sevelamer carbonate  2.4 g Oral TID WC  tamsulosin  0.4 mg Oral QHS    Dialysis Orders:  on MWF 180NRe 5 hours BFR 400 DFR 500 EDW 98.4kg 2K 2Ca AVF 15g No heparin/ESA Calcitriol 1 mcg PO q HD  Assessment/Plan: Chronic RLE wound/Osteomyelitis-seen yesterday by Dr. Sharol Given for osteomyelitis/gangrene of right heel that has failed efforts at limb salvage and plans noted for right BKA on 6/15.  He remains on antibiotic coverage with cefepime. ESRD - on HD MWF, planned for HD today Hypertension/volume- Euvolemic on exam but does report some shortness of breath and Na+ is decreased. UF with HD today as tolerated.  Anemia of CKD -hemoglobin and hematocrit are currently at goal and he does not have indication for ESA.  No overt blood loss. Secondary Hyperparathyroidism -Corrected Calcium and phosphorus are both elevated. Will decrease calcitriol dose. Continue renvela with meals.  Nutrition -Requested regular diet. Will  follow potassium and phosphorus levels closely  Anice Paganini, PA-C 12/29/2021, 9:09 AM  [facility] Pager: [phone]

## 2021-12-29 NOTE — Plan of Care (Signed)
  Problem: Education: Goal: Knowledge of General Education information will improve Description: Including pain rating scale, medication(s)/side effects and non-pharmacologic comfort measures Outcome: Progressing   Problem: Health Behavior/Discharge Planning: Goal: Ability to manage health-related needs will improve Outcome: Progressing   Problem: Activity: Goal: Risk for activity intolerance will decrease Outcome: Progressing   Problem: Nutrition: Goal: Adequate nutrition will be maintained Outcome: Progressing   Problem: Coping: Goal: Level of anxiety will decrease Outcome: Progressing   Problem: Elimination: Goal: Will not experience complications related to bowel motility Outcome: Progressing   

## 2021-12-29 NOTE — Care Management Important Message (Signed)
Important Message  Patient Details  Name: Michael Riggs MRN: [id number] Date of Birth: 1970-10-31   Medicare Important Message Given:  Yes     Orbie Pyo 12/29/2021, 4:09 PM

## 2021-12-29 NOTE — Progress Notes (Addendum)
Michael Riggs a 51 y.o. male admitted on 6/9 with ESRD with HD MWF, PVD, and L BKA  being followed outpt by ortho and told to return for chronic nonhealing wound of R calcaneous with CT concerning for osteomyelitis.  Wound cultures from 10/10/21 grew abundant Serratia marascens, abundant Morganella morganii, and abundant Corynebacterium with no anaerobes isolated. Blood cultures from 6/9 growing Morganella. Pharmacy has been consulted for cefepime dosing.   Estimated Creatinine Clearance: 12.2 mL/min (A) (by C-G formula based on SCr of 8.77 mg/dL (H)).  Plan: Continue Cefepime 1g IV Q24H F/U HD schedule, tolerability, deescalation, ortho recs   Allergies:  Allergies  Allergen Reactions   Tape Itching    Prefers silk tape over regular tape    Filed Weights   12/26/21 0807  Weight: 102.1 kg (225 lb)       Latest Ref Rng & Units 12/27/2021    2:27 AM 12/26/2021    4:14 AM 12/25/2021    7:03 PM  CBC  WBC 4.0 - 10.5 K/uL 14.5  15.5  13.2   Hemoglobin 13.0 - 17.0 g/dL 12.5  12.7  12.7   Hematocrit 39.0 - 52.0 % 38.0  38.8  40.6   Platelets 150 - 400 K/uL 353  329  343     Antimicrobials this admission: cefepime 6/9 >>   Microbiology results: 6/9 Bcx: morganella   Michael Riggs, PharmD, BCPS, FNKF Clinical Pharmacist [facility] Please utilize Amion for appropriate phone number to reach the unit pharmacist ([place])  12/29/2021 8:53 AM

## 2021-12-29 NOTE — Progress Notes (Signed)
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 Sharol Given of orthopedics and patient was told to return on 6/8 Patient instead returned to the emergency room and found to have breakdown of the calcaneus posterolaterally Found to have osteomyelitis of the right calcaneal area Antibiotics were withheld initially  Orthopedics as well as renal was consulted    Hospital-Problem based course  Gangrenous changes to right calcaneum/Achilles tendon Prior angioplasty distal SFA tibio-peroneal trunk with DES placed 10/06/2021 Continue cefepime-leukocytosis about the same Continuing aspirin-Plavix 75 was held from admission continue--atorvastatin 40 at bedtime amputation 6/14 as per Dr. 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 MWF, started Renvela await PTH and if >300, would initiate cinacalcet Cannot use IV iron given risk of infection--continue Auryxia 630 x 3 times daily meals Continue midodrine 10 mg MWF at HD DM TY 2  not requiring hypoglycemic agents CBg 120-150--probably does not require mealtime coverage HTN Bp acceptable--continue amlodipine 5, metoprolol 25 twice daily hyperProlactinemia Continue Parlodel 2.5 at bedtime-outpatient endocrinology input needed lower extremity at the knee is asking for treatment 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 BPH LUTS Continue Flomax 0.4 daily OSA Using oxygen as doesn't like our mask--cont O2 until d/c home  DVT prophylaxis: Heparin Code Status: Full Family Communication: None present at this time Disposition:  Status is: Inpatient Remains inpatient appropriate because:   Will require RLE BKA amputation --may require short term rehab   Consultants:  Nephrology Orthopedics  Procedures: Multiple  Antimicrobials: Cefepime   Subjective:  No new issues, eating drinking No chest pain no fever Complaining of dry skin/rash over left knee asking for ointment  Objective: Vitals:   12/28/21 1121 12/28/21 2017 12/29/21 0522 12/29/21 0743  BP: 122/79 116/79 106/78 108/73  Pulse: 67 66 66 66  Resp: 12 16 18    Temp: 97.6 F (36.4 C) 97.8 F (36.6 C) 97.6 F (36.4 C) (!) 97.5 F (36.4 C)  TempSrc: Oral Oral Oral Oral  SpO2: 100% 100% 97% 97%  Weight:      Height:        Intake/Output Summary (Last 24 hours) at 12/29/2021 1415 Last data filed at 12/29/2021 0900 Gross per 24 hour  Intake 240 ml  Output --  Net 240 ml    Filed Weights   12/26/21 0807  Weight: 102.1 kg  Examination:  EOMI NCAT no focal deficit no wheeze rales rhonchi Chest clear no added sounds Abdomen soft no rebound no guarding He does have a rash over his  left knee extending halfway down to the stump and I advised him to avoid steroids  Data Reviewed: personally reviewed   CBC    Component Value Date/Time   WBC 14.5 (H) 12/27/2021 0227   RBC 3.58 (L) 12/27/2021 0227   HGB 12.5 (L) 12/27/2021 0227   HCT 38.0 (L) 12/27/2021 0227   PLT 353 12/27/2021 0227   MCV 106.1 (H) 12/27/2021 0227   MCH 34.9 (H) 12/27/2021 0227   MCHC 32.9 12/27/2021 0227   RDW 15.7 (H) 12/27/2021 0227   LYMPHSABS 1.7 12/27/2021 0227   MONOABS 1.3 (H) 12/27/2021 0227   EOSABS 0.2 12/27/2021 0227   BASOSABS 0.1 12/27/2021 0227      Latest Ref Rng & Units 12/29/2021    2:08 AM 12/27/2021    2:27 AM 12/26/2021    4:14 AM  CMP  Glucose 70 - 99 mg/dL 113  110  133   BUN 6 - 20 mg/dL 81  40  62   Creatinine 0.61 - 1.24 mg/dL 8.77  5.87  8.02   Sodium 135 - 145 mmol/L 128  132  132   Potassium 3.5 - 5.1 mmol/L 5.1  4.3  5.1   Chloride 98 - 111 mmol/L 85  89  90   CO2 22 - 32 mmol/L 24  30  27    Calcium 8.9 - 10.3 mg/dL 9.4  9.6  9.3      Radiology Studies: No results found.   Scheduled Meds:  amLODipine  5 mg Oral QHS   aspirin EC  81 mg Oral Daily   atorvastatin  40 mg Oral QHS   bromocriptine  2.5 mg Oral QHS   buPROPion  300 mg Oral q AM   calcitRIOL  1 mcg Oral Q M,W,F-HD   Chlorhexidine Gluconate Cloth  6 each Topical Q0600   DULoxetine  20 mg Oral BID   heparin  5,000 Units Subcutaneous Q8H   insulin aspart  0-6 Units Subcutaneous TID WC   melatonin  10 mg Oral QHS   metoprolol tartrate  25 mg Oral BID   midodrine  10 mg Oral Q M,W,F   pantoprazole  40 mg Oral QHS   sevelamer carbonate  2.4 g Oral TID WC   tamsulosin  0.4 mg Oral QHS   Continuous Infusions:  ceFEPime (MAXIPIME) IV 1 g (12/29/21 1306)     LOS: 3 days   Time spent: 66  Nita Sells, MD [facility] To contact the attending provider between 7A-7P or the covering provider during after hours 7P-7A, please log into the web site [email or web address] and access using  universal [place] password for that web site. If you do not have the password, please call the hospital operator.  12/29/2021, 2:15 PM

## 2021-12-30 DIAGNOSIS — M86171 Other acute osteomyelitis, right ankle and foot: Secondary | ICD-10-CM | POA: Diagnosis not present

## 2021-12-30 LAB — GLUCOSE, CAPILLARY
Glucose-Capillary: 111 mg/dL — ABNORMAL HIGH (ref 70–99)
Glucose-Capillary: 169 mg/dL — ABNORMAL HIGH (ref 70–99)
Glucose-Capillary: 130 mg/dL — ABNORMAL HIGH (ref 70–99)
Glucose-Capillary: 118 mg/dL — ABNORMAL HIGH (ref 70–99)

## 2021-12-30 LAB — CULTURE, BLOOD (ROUTINE X 2)
Culture: NO GROWTH
Special Requests: ADEQUATE

## 2021-12-30 LAB — SURGICAL PCR SCREEN
MRSA, PCR: NEGATIVE
Staphylococcus aureus: NEGATIVE

## 2021-12-30 LAB — PTH, INTACT AND CALCIUM
Calcium, Total (PTH): 9.5 mg/dL (ref 8.7–10.2)
PTH: 75 pg/mL — ABNORMAL HIGH (ref 15–65)

## 2021-12-30 MED ORDER — CEFAZOLIN SODIUM-DEXTROSE 2-4 GM/100ML-% IV SOLN
2.0000 g | INTRAVENOUS | Status: AC
  Filled 2021-12-30: qty 100

## 2021-12-30 MED ORDER — POVIDONE-IODINE 10 % EX SWAB
2.0000 "application " | Freq: Once | CUTANEOUS | Status: AC
  Administered 2021-12-31: 2 via TOPICAL

## 2021-12-30 MED ORDER — CHLORHEXIDINE GLUCONATE 4 % EX LIQD
60.0000 mL | Freq: Once | CUTANEOUS | Status: AC
  Administered 2021-12-31: 4 via TOPICAL
  Filled 2021-12-30: qty 60

## 2021-12-30 MED ORDER — TRANEXAMIC ACID-NACL 1000-0.7 MG/100ML-% IV SOLN: 1000.0000 mg | INTRAVENOUS | Status: AC

## 2021-12-30 MED ORDER — CHLORHEXIDINE GLUCONATE CLOTH 2 % EX PADS
6.0000 | MEDICATED_PAD | Freq: Every day | CUTANEOUS | Status: DC
  Administered 2021-12-30 – 2022-01-04 (×5): 6 via TOPICAL

## 2021-12-30 NOTE — Progress Notes (Signed)
[facility] Progress Note   Subjective:   Seen in room. Reports he had some dizziness after dialysis yesterday, noted he was hypotensive overnight. Had 1.8L removed. Denies SOB, CP, dizziness, abdominal pain and nausea.   Objective Vitals:   12/29/21 1930 12/29/21 2122 12/29/21 2247 12/30/21 0504  BP: 115/78 (!) 83/74 (!) 110/95 (!) 101/59  Pulse: (!) 59 62  68  Resp: 15 16  17   Temp:  98 F (36.7 C)    TempSrc:  Oral    SpO2:      Weight:      Height:       Physical Exam General: Alert male in NAD Heart: RRR, no murmurs, rubs or gallops Lungs: CTA bilaterally without wheezing, rhonchi or rales Abdomen: Soft, non-tender, non-distended, +BS Extremities: L BKA, RLE wrapped foot to ankle. No edema notted Dialysis Access:  LUE AVF + t/b  Additional Objective Labs: Basic Metabolic Panel: Recent Labs  Lab 12/26/21 0414 12/27/21 0227 12/29/21 0208  NA 132* 132* 128*  K 5.1 4.3 5.1  CL 90* 89* 85*  CO2 27 30 24   GLUCOSE 133* 110* 113*  BUN 62* 40* 81*  CREATININE 8.02* 5.87* 8.77*  CALCIUM 9.3 9.6 9.4  PHOS  --  5.4* 8.9*   Liver Function Tests: Recent Labs  Lab 12/25/21 1903 12/27/21 0227 12/29/21 0208  AST 42*  --   --   ALT 86*  --   --   ALKPHOS 212*  --   --   BILITOT 1.3*  --   --   PROT 7.8  --   --   ALBUMIN 3.2* 3.0* 2.8*   No results for input(s): "LIPASE", "AMYLASE" in the last 168 hours. CBC: Recent Labs  Lab 12/25/21 1903 12/26/21 0414 12/27/21 0227  WBC 13.2* 15.5* 14.5*  NEUTROABS 10.2* 11.7* 10.6*  HGB 12.7* 12.7* 12.5*  HCT 40.6 38.8* 38.0*  MCV 109.4* 108.1* 106.1*  PLT 343 329 353   Blood Culture    Component Value Date/Time   SDES BLOOD RIGHT FOREARM 12/26/2021 0040   SPECREQUEST  12/26/2021 0040    BOTTLES DRAWN AEROBIC AND ANAEROBIC Blood Culture adequate volume   CULT MORGANELLA MORGANII (A) 12/26/2021 0040   REPTSTATUS 12/28/2021 FINAL 12/26/2021 0040    Cardiac Enzymes: No results for input(s):  "CKTOTAL", "CKMB", "CKMBINDEX", "TROPONINI" in the last 168 hours. CBG: Recent Labs  Lab 12/28/21 2137 12/29/21 0848 12/29/21 1125 12/29/21 2115 12/30/21 0753  GLUCAP 128* 125* 150* 133* 111*   Iron Studies: No results for input(s): "IRON", "TIBC", "TRANSFERRIN", "FERRITIN" in the last 72 hours. @lablastinr3 @ Studies/Results: No results found. Medications:  ceFEPime (MAXIPIME) IV 1 g (12/29/21 1306)    amLODipine  5 mg Oral QHS   aspirin EC  81 mg Oral Daily   atorvastatin  40 mg Oral QHS   bromocriptine  2.5 mg Oral QHS   buPROPion  300 mg Oral q AM   calcitRIOL  1 mcg Oral Q M,W,F-HD   Chlorhexidine Gluconate Cloth  6 each Topical Q0600   DULoxetine  20 mg Oral BID   heparin  5,000 Units Subcutaneous Q8H   insulin aspart  0-6 Units Subcutaneous TID WC   melatonin  10 mg Oral QHS   metoprolol tartrate  25 mg Oral BID   midodrine  10 mg Oral Q M,W,F   pantoprazole  40 mg Oral QHS   sevelamer carbonate  2.4 g Oral TID WC   tamsulosin  0.4 mg Oral QHS  urea   Topical BID    Dialysis Orders: [place] on MWF 180NRe 5 hours BFR 400 DFR 500 EDW 98.4kg 2K 2Ca AVF 15g No heparin/ESA Calcitriol 1 mcg PO q HD  Assessment/Plan: Chronic RLE wound/Osteomyelitis-seen by Dr. Sharol Given for osteomyelitis/gangrene of right heel that has failed efforts at limb salvage and plans noted for right BKA on 6/15.  He remains on antibiotic coverage with cefepime. ESRD - on HD MWF, planned for HD tomorrow after his surgery.  Hypertension/volume- Euvolemic on exam but  Na+ is decreased. Only had 1.8L UF yesterday and had some low BP overnight.It looks like his midodrine was given yesterday AM rather than prior to HD which is likely the issue. Will attempt 2.5L UF tomorrow with midodrine prior.  Anemia of CKD -hemoglobin and hematocrit are currently at goal and he does not have indication for ESA.  No overt blood loss. Secondary Hyperparathyroidism -Corrected Calcium and phosphorus are both elevated.  decreased calcitriol dose on 12/29/21. Continue renvela with meals.  Nutrition -Requested regular diet. Will follow potassium and phosphorus levels closely    Anice Paganini, PA-C 12/30/2021, 9:10 AM  [facility] Pager: [phone]

## 2021-12-30 NOTE — Plan of Care (Signed)

## 2021-12-30 NOTE — Progress Notes (Signed)
Pt receives out-pt HD at [facility] on MWF. Pt arrives at 8:55 for 9:15 chair time. Will assist as needed.   Melven Sartorius Renal Navigator [phone]

## 2021-12-30 NOTE — Plan of Care (Signed)

## 2021-12-30 NOTE — Progress Notes (Signed)
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 Sharol Given of orthopedics and patient was told to return on 6/8 Patient instead returned to the emergency room and found to have breakdown of the calcaneus posterolaterally Found to have osteomyelitis of the right calcaneal area Antibiotics were withheld initially  Orthopedics as well as renal was consulted    Hospital-Problem based course  Gangrenous changes to right calcaneum/Achilles tendon Prior angioplasty distal SFA tibio-peroneal trunk with DES placed 10/06/2021 Continue cefepime till definitive surgery Continuing aspirin--atorvastatin 40 at bedtime -Plavix 75 was held from admission --resume when able per Ortho amputation 6/14 as per Dr. Verlon Au control Dilaudid 4 mg every 8  Right  lower extremity bleeding nursing asked to assess and place supportive dressings-if significant will assess and check labs-at this time looks like it is stopped ESRD MWF Severe calciphylaxis continue Rocaltrol 2 mcg MWF, started Renvela 2.4 tid meals await PTH and if >300, would initiate cinacalcet Cannot use IV iron given risk of infection--continue Auryxia 630 x 3 times daily meals Hypotensive yesterday secondary to mistiming midodrine--- per nephrology needs to continue midodrine 10 mg MWF just prior to HD DM TY 2  not requiring hypoglycemic agents CBg 110-150--probably does not require mealtime coverage HTN Bp acceptable--continue amlodipine 5, metoprolol 25 twice daily hyperProlactinemia Continue Parlodel 2.5 at bedtime-outpatient endocrinology input needed 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 BPH LUTS Continue Flomax 0.4 daily OSA Using oxygen as doesn't like our mask--cont O2 until d/c home Rash over left knee and extending down to stump Started on urea 10% emollient, advised against any steroids, continue Atarax 10 3 times daily itching  DVT prophylaxis: Heparin Code Status: Full Family Communication: None present at this time Disposition:  Status is: Inpatient Remains inpatient appropriate because:   Will require RLE BKA amputation --may require short term rehab   Consultants:  Nephrology Orthopedics  Procedures: Multiple  Antimicrobials: Cefepime   Subjective:  Some bleeding overnight from the right lower extremity which seems to have stopped now-he is wearing a dressing He has no chest pain or fever  Objective: Vitals:   12/29/21 1930 12/29/21 2122 12/29/21 2247 12/30/21 0504  BP: 115/78 (!) 83/74 (!) 110/95 (!) 101/59  Pulse: (!) 59 62  68  Resp: 15 16  17   Temp:  98 F (36.7 C)    TempSrc:  Oral    SpO2:  Weight:      Height:        Intake/Output Summary (Last 24 hours)  at 12/30/2021 0937 Last data filed at 12/29/2021 1930 Gross per 24 hour  Intake --  Output 1.8 ml  Net -1.8 ml    Filed Weights   12/26/21 0807 12/29/21 1609  Weight: 102.1 kg 103.5 kg    Examination:  Alert coherent looks about the same no distress thick neck S1-S2 no murmur Chest clear no added sound no wheeze rales rhonchi Abdomen soft Right lower extremity is bandaged some old blood is on the bandage itself but it seems to not be actively bleeding Neuro intact moving 4 limbs equally Calciphylaxis stigmata over all lower extremities with some wounds Right knee looks less ashy   Data Reviewed: personally reviewed   CBC    Component Value Date/Time   WBC 14.5 (H) 12/27/2021 0227   RBC 3.58 (L) 12/27/2021 0227   HGB 12.5 (L) 12/27/2021 0227   HCT 38.0 (L) 12/27/2021 0227   PLT 353 12/27/2021 0227   MCV 106.1 (H) 12/27/2021 0227   MCH 34.9 (H) 12/27/2021 0227   MCHC 32.9 12/27/2021 0227   RDW 15.7 (H) 12/27/2021 0227   LYMPHSABS 1.7 12/27/2021 0227   MONOABS 1.3 (H) 12/27/2021 0227   EOSABS 0.2 12/27/2021 0227   BASOSABS 0.1 12/27/2021 0227      Latest Ref Rng & Units 12/29/2021    2:08 AM 12/27/2021    2:27 AM 12/26/2021    4:14 AM  CMP  Glucose 70 - 99 mg/dL 113  110  133   BUN 6 - 20 mg/dL 81  40  62   Creatinine 0.61 - 1.24 mg/dL 8.77  5.87  8.02   Sodium 135 - 145 mmol/L 128  132  132   Potassium 3.5 - 5.1 mmol/L 5.1  4.3  5.1   Chloride 98 - 111 mmol/L 85  89  90   CO2 22 - 32 mmol/L 24  30  27    Calcium 8.9 - 10.3 mg/dL 9.4  9.6  9.3      Radiology Studies: No results found.   Scheduled Meds:  amLODipine  5 mg Oral QHS   aspirin EC  81 mg Oral Daily   atorvastatin  40 mg Oral QHS   bromocriptine  2.5 mg Oral QHS   buPROPion  300 mg Oral q AM   calcitRIOL  1 mcg Oral Q M,W,F-HD   Chlorhexidine Gluconate Cloth  6 each Topical Q0600   Chlorhexidine Gluconate Cloth  6 each Topical Q0600   DULoxetine  20 mg Oral BID   heparin  5,000 Units  Subcutaneous Q8H   insulin aspart  0-6 Units Subcutaneous TID WC   melatonin  10 mg Oral QHS   metoprolol tartrate  25 mg Oral BID   midodrine  10 mg Oral Q M,W,F   pantoprazole  40 mg Oral QHS   sevelamer carbonate  2.4 g Oral TID WC   tamsulosin  0.4 mg Oral QHS   urea   Topical BID   Continuous Infusions:  ceFEPime (MAXIPIME) IV 1 g (12/29/21 1306)     LOS: 4 days   Time spent: 44  Nita Sells, MD [facility] To contact the attending provider between 7A-7P or the covering provider during after hours 7P-7A, please log into the web site [email or web address] and access using universal [place] password for that web site. If you do not have the password, please call the  hospital operator.  12/30/2021, 9:37 AM

## 2021-12-31 DIAGNOSIS — N186 End stage renal disease: Secondary | ICD-10-CM | POA: Diagnosis not present

## 2021-12-31 DIAGNOSIS — M86171 Other acute osteomyelitis, right ankle and foot: Secondary | ICD-10-CM | POA: Diagnosis not present

## 2021-12-31 DIAGNOSIS — I10 Essential (primary) hypertension: Secondary | ICD-10-CM | POA: Diagnosis not present

## 2021-12-31 LAB — CBC WITH DIFFERENTIAL/PLATELET
Abs Immature Granulocytes: 0.4 10*3/uL — ABNORMAL HIGH (ref 0.00–0.07)
Basophils Absolute: 0.1 10*3/uL (ref 0.0–0.1)
Basophils Relative: 0 %
Eosinophils Absolute: 0.3 10*3/uL (ref 0.0–0.5)
Eosinophils Relative: 2 %
HCT: 36.8 % — ABNORMAL LOW (ref 39.0–52.0)
Hemoglobin: 12.2 g/dL — ABNORMAL LOW (ref 13.0–17.0)
Immature Granulocytes: 3 %
Lymphocytes Relative: 9 %
Lymphs Abs: 1.3 10*3/uL (ref 0.7–4.0)
MCH: 34.4 pg — ABNORMAL HIGH (ref 26.0–34.0)
MCHC: 33.2 g/dL (ref 30.0–36.0)
MCV: 103.7 fL — ABNORMAL HIGH (ref 80.0–100.0)
Monocytes Absolute: 1.1 10*3/uL — ABNORMAL HIGH (ref 0.1–1.0)
Monocytes Relative: 8 %
Neutro Abs: 11 10*3/uL — ABNORMAL HIGH (ref 1.7–7.7)
Neutrophils Relative %: 78 %
Platelets: 269 10*3/uL (ref 150–400)
RBC: 3.55 MIL/uL — ABNORMAL LOW (ref 4.22–5.81)
RDW: 15.9 % — ABNORMAL HIGH (ref 11.5–15.5)
WBC: 14.1 10*3/uL — ABNORMAL HIGH (ref 4.0–10.5)
nRBC: 0.1 % (ref 0.0–0.2)

## 2021-12-31 LAB — GLUCOSE, CAPILLARY
Glucose-Capillary: 96 mg/dL (ref 70–99)
Glucose-Capillary: 78 mg/dL (ref 70–99)
Glucose-Capillary: 88 mg/dL (ref 70–99)
Glucose-Capillary: 74 mg/dL (ref 70–99)
Glucose-Capillary: 294 mg/dL — ABNORMAL HIGH (ref 70–99)

## 2021-12-31 LAB — RENAL FUNCTION PANEL
Albumin: 2.8 g/dL — ABNORMAL LOW (ref 3.5–5.0)
Anion gap: 18 — ABNORMAL HIGH (ref 5–15)
BUN: 71 mg/dL — ABNORMAL HIGH (ref 6–20)
CO2: 23 mmol/L (ref 22–32)
Calcium: 9.1 mg/dL (ref 8.9–10.3)
Chloride: 89 mmol/L — ABNORMAL LOW (ref 98–111)
Creatinine, Ser: 7.57 mg/dL — ABNORMAL HIGH (ref 0.61–1.24)
GFR, Estimated: 8 mL/min — ABNORMAL LOW (ref >60)
Glucose, Bld: 98 mg/dL (ref 70–99)
Phosphorus: 7 mg/dL — ABNORMAL HIGH (ref 2.5–4.6)
Potassium: 4.5 mmol/L (ref 3.5–5.1)
Sodium: 130 mmol/L — ABNORMAL LOW (ref 135–145)

## 2021-12-31 MED ORDER — LIDOCAINE HCL (PF) 1 % IJ SOLN: 5.0000 mL | INTRAMUSCULAR | Status: DC | PRN

## 2021-12-31 MED ORDER — ALTEPLASE 2 MG IJ SOLR
2.0000 mg | Freq: Once | INTRAMUSCULAR | Status: DC | PRN
Start: 2021-12-31 — End: 2021-12-31

## 2021-12-31 MED ORDER — ANTICOAGULANT SODIUM CITRATE 4% (200MG/5ML) IV SOLN: 5.0000 mL | Status: DC | PRN

## 2021-12-31 MED ORDER — PENTAFLUOROPROP-TETRAFLUOROETH EX AERO
1.0000 | INHALATION_SPRAY | CUTANEOUS | Status: DC | PRN
Start: 2021-12-31 — End: 2021-12-31

## 2021-12-31 MED ORDER — LIDOCAINE-PRILOCAINE 2.5-2.5 % EX CREA: 1.0000 "application " | TOPICAL_CREAM | CUTANEOUS | Status: DC | PRN

## 2021-12-31 NOTE — Progress Notes (Signed)
[facility] PROGRESS NOTE    Progress Note  Michael Riggs  WFU:[id number] DOB: 11/24/1970 DOA: 12/25/2021 PCP: [name], MD     Brief Narrative:   Michael Riggs is an 51 y.o. 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 Sharol Given as an outpatient and told to to the office on 12/26/2031 Return to the emergency room found to have a breakdown of his calcaneus posteriorly found to have osteomyelitis in the right calcaneus area was started on antibiotics and orthopedic surgery was consulted.    Assessment/Plan:   Right leg gangrenous changes of right calcaneus and Achilles tendon osteomyelitis of foot, right, acute [facility]): He had a history of prior angioplasty to the distal SFA with a DES placement 10/06/2021. Was started on IV cefepime Continue on aspirin and statins. Plavix was held. Patient had failed limb salvage intervention. Orthopedic surgery recommended to proceed with transfer tibial amputation on the right he has agreed scheduled for 01/01/2022.  Right lower extremity bleeding: Management per Ortho.  End-stage renal disease: Continue further dialysis per renal.  Transient hypotension: Likely due to missed dose of midodrine. We will continue midodrine Monday Wednesday Friday prior to HD.  Diabetes mellitus type 2: Hold on no oral hypoglycemic agents. Require 1 unit of insulin about 48 hours ago. Continue current regimen  Essential hypertension: Continue metoprolol twice a day discontinue amlodipine.  Prolactinemia: Continue primidone at bedtime follow-up with endocrinologist as an outpatient.  Diabetic peripheral neuropathy: Continue Cymbalta and Wellbutrin.  BPH: Continue  Flomax.  Obstructive sleep apnea: noted  DVT prophylaxis: lovenox Family Communication:none Status is: Inpatient Remains inpatient appropriate because: Osteomyelitis and gangrenous right foot    Code Status:     Code Status Orders  (From admission, onward)           Start     Ordered   12/26/21 0337  Full code  Continuous        12/26/21 0339           Code Status History     Date Active Date Inactive Code Status Order ID Comments User Context   10/06/2021 1611 10/16/2021 2203 Full Code [id number]  Wells Guiles, DO ED   11/06/2019 0601 11/10/2019 2303 Full Code [id number]  Rise Patience, MD ED   10/11/2019 0645 10/12/2019 2155 Full Code [id number]  Rise Patience, MD Inpatient   05/14/2019 2206 05/24/2019 1435 Full Code [id number]  Gwynne Edinger, MD ED   03/28/2019 1007 04/27/2019 2150 Full Code [id number]  Karmen Bongo, MD Inpatient   02/09/2019 0003 03/14/2019 1528 Full Code [id number]  Cathlyn Parsons, PA-C Inpatient   02/09/2019 0003 02/09/2019 0003 Full Code [id number]  Cathlyn Parsons, PA-C Inpatient   02/04/2019 0546 02/08/2019 2306 Full Code [id number]  Rise Patience, MD Inpatient   01/30/2019 2359 02/03/2019 1539 Full Code [id number]  Rise Patience, MD ED         IV Access:   Peripheral IV   Procedures and diagnostic studies:   No results found.   Medical Consultants:   None.   Subjective:    Michael Riggs pain is controlled.  Objective:    Vitals:   12/31/21 1000 12/31/21 1036 12/31/21 1100 12/31/21 1130  BP: 109/70 103/74 103/74 123/81  Pulse:  Marland Kitchen)  57 (!) 57 (!) 57  Resp:  11 11 13   Temp:      TempSrc:      SpO2:  94%    Weight:      Height:       SpO2: 94 % O2 Flow Rate (L/min): 2 L/min  No intake or output data in the 24 hours ending 12/31/21 1155 Filed Weights   12/26/21 0807 12/29/21 1609 12/31/21 0931  Weight: 102.1 kg 103.5 kg 103.1 kg    Exam: General exam: In no acute  distress. Respiratory system: Good air movement and clear to auscultation. Cardiovascular system: S1 & S2 heard, RRR. No JVD. Gastrointestinal system: Abdomen is nondistended, soft and nontender.  Psychiatry: Judgement and insight appear normal. Mood & affect appropriate.    Data Reviewed:    Labs: Basic Metabolic Panel: Recent Labs  Lab 12/25/21 1903 12/26/21 0414 12/27/21 0227 12/28/21 0207 12/29/21 0208 12/31/21 0232  NA 135 132* 132*  --  128* 130*  K 4.3 5.1 4.3  --  5.1 4.5  CL 88* 90* 89*  --  85* 89*  CO2 28 27 30   --  24 23  GLUCOSE 153* 133* 110*  --  113* 98  BUN 56* 62* 40*  --  81* 71*  CREATININE 7.50* 8.02* 5.87*  --  8.77* 7.57*  CALCIUM 9.7 9.3 9.6 9.5 9.4 9.1  PHOS  --   --  5.4*  --  8.9* 7.0*   GFR Estimated Creatinine Clearance: 14.2 mL/min (A) (by C-G formula based on SCr of 7.57 mg/dL (H)). Liver Function Tests: Recent Labs  Lab 12/25/21 1903 12/27/21 0227 12/29/21 0208 12/31/21 0232  AST 42*  --   --   --   ALT 86*  --   --   --   ALKPHOS 212*  --   --   --   BILITOT 1.3*  --   --   --   PROT 7.8  --   --   --   ALBUMIN 3.2* 3.0* 2.8* 2.8*   No results for input(s): "LIPASE", "AMYLASE" in the last 168 hours. No results for input(s): "AMMONIA" in the last 168 hours. Coagulation profile No results for input(s): "INR", "PROTIME" in the last 168 hours. COVID-19 Labs  No results for input(s): "DDIMER", "FERRITIN", "LDH", "CRP" in the last 72 hours.  Lab Results  Component Value Date   SARSCOV2NAA NEGATIVE 11/06/2019   [place] NEGATIVE 10/11/2019   [place] NEGATIVE 07/06/2019   [place] NEGATIVE 05/21/2019    CBC: Recent Labs  Lab 12/25/21 1903 12/26/21 0414 12/27/21 0227 12/31/21 0232  WBC 13.2* 15.5* 14.5* 14.1*  NEUTROABS 10.2* 11.7* 10.6* 11.0*  HGB 12.7* 12.7* 12.5* 12.2*  HCT 40.6 38.8* 38.0* 36.8*  MCV 109.4* 108.1* 106.1* 103.7*  PLT 343 329 353 269   Cardiac Enzymes: No results for input(s): "CKTOTAL",  "CKMB", "CKMBINDEX", "TROPONINI" in the last 168 hours. BNP (last 3 results) No results for input(s): "PROBNP" in the last 8760 hours. CBG: Recent Labs  Lab 12/30/21 0753 12/30/21 1156 12/30/21 1625 12/30/21 2104 12/31/21 0758  GLUCAP 111* 169* 130* 118* 96   D-Dimer: No results for input(s): "DDIMER" in the last 72 hours. Hgb A1c: No results for input(s): "HGBA1C" in the last 72 hours. Lipid Profile: No results for input(s): "CHOL", "HDL", "LDLCALC", "TRIG", "CHOLHDL", "LDLDIRECT" in the last 72 hours. Thyroid function studies: No results for input(s): "TSH", "T4TOTAL", "T3FREE", "THYROIDAB" in the last 72 hours.  Invalid input(s): "FREET3" Anemia work  up: No results for input(s): "VITAMINB12", "FOLATE", "FERRITIN", "TIBC", "IRON", "RETICCTPCT" in the last 72 hours. Sepsis Labs: Recent Labs  Lab 12/25/21 1903 12/26/21 0414 12/27/21 0227 12/31/21 0232  WBC 13.2* 15.5* 14.5* 14.1*  LATICACIDVEN 1.3  --   --   --    Microbiology Recent Results (from the past 240 hour(s))  Blood culture (routine x 2)     Status: None   Collection Time: 12/25/21  7:03 PM   Specimen: BLOOD  Result Value Ref Range Status   Specimen Description BLOOD RIGHT ANTECUBITAL  Final   Special Requests   Final    BOTTLES DRAWN AEROBIC AND ANAEROBIC Blood Culture adequate volume   Culture   Final    NO GROWTH 5 DAYS Performed at [facility], [address], [place], [place] [address]    Report Status 12/30/2021 FINAL  Final  Blood culture (routine x 2)     Status: Abnormal   Collection Time: 12/26/21 12:40 AM   Specimen: BLOOD RIGHT FOREARM  Result Value Ref Range Status   Specimen Description BLOOD RIGHT FOREARM  Final   Special Requests   Final    BOTTLES DRAWN AEROBIC AND ANAEROBIC Blood Culture adequate volume   Culture  Setup Time   Final    GRAM NEGATIVE RODS BOTTLES DRAWN AEROBIC ONLY CRITICAL RESULT CALLED TO, READ BACK BY AND VERIFIED WITH: PHARMD Loaza ON 12/26/21 @  2027 BY DRT Performed at [facility], [place] [address] [place] [address], [place], [place] [address]    Culture [facility] MORGANII (A)  Final   Report Status 12/28/2021 FINAL  Final   Organism ID, Bacteria MORGANELLA MORGANII  Final      Susceptibility   Morganella morganii - MIC*    AMPICILLIN >=32 RESISTANT Resistant     CEFAZOLIN >=64 RESISTANT Resistant     CEFTAZIDIME 2 SENSITIVE Sensitive     CIPROFLOXACIN <=0.25 SENSITIVE Sensitive     GENTAMICIN <=1 SENSITIVE Sensitive     IMIPENEM 2 SENSITIVE Sensitive     TRIMETH/SULFA <=20 SENSITIVE Sensitive     AMPICILLIN/SULBACTAM >=32 RESISTANT Resistant     PIP/TAZO <=4 SENSITIVE Sensitive     * MORGANELLA MORGANII  Blood Culture ID Panel (Reflexed)     Status: Abnormal   Collection Time: 12/26/21 12:40 AM  Result Value Ref Range Status   Enterococcus faecalis NOT DETECTED NOT DETECTED Final   Enterococcus Faecium NOT DETECTED NOT DETECTED Final   Listeria monocytogenes NOT DETECTED NOT DETECTED Final   Staphylococcus species NOT DETECTED NOT DETECTED Final   Staphylococcus aureus (BCID) NOT DETECTED NOT DETECTED Final   Staphylococcus epidermidis NOT DETECTED NOT DETECTED Final   Staphylococcus lugdunensis NOT DETECTED NOT DETECTED Final   Streptococcus species NOT DETECTED NOT DETECTED Final   Streptococcus agalactiae NOT DETECTED NOT DETECTED Final   Streptococcus pneumoniae NOT DETECTED NOT DETECTED Final   Streptococcus pyogenes NOT DETECTED NOT DETECTED Final   A.calcoaceticus-baumannii NOT DETECTED NOT DETECTED Final   Bacteroides fragilis NOT DETECTED NOT DETECTED Final   Enterobacterales DETECTED (A) NOT DETECTED Final    Comment: Enterobacterales represent a large order of gram negative bacteria, not a single organism. Refer to culture for further identification. CRITICAL RESULT CALLED TO, READ BACK BY AND VERIFIED WITH: PHARMD CARLA Dolores ON 12/26/21 @ 2027 BY DRT    Enterobacter cloacae complex NOT DETECTED NOT DETECTED Final    Escherichia coli NOT DETECTED NOT DETECTED Final   Klebsiella aerogenes NOT DETECTED NOT DETECTED Final  Klebsiella oxytoca NOT DETECTED NOT DETECTED Final   Klebsiella pneumoniae NOT DETECTED NOT DETECTED Final   Proteus species NOT DETECTED NOT DETECTED Final   Salmonella species NOT DETECTED NOT DETECTED Final   Serratia marcescens NOT DETECTED NOT DETECTED Final   Haemophilus influenzae NOT DETECTED NOT DETECTED Final   Neisseria meningitidis NOT DETECTED NOT DETECTED Final   Pseudomonas aeruginosa NOT DETECTED NOT DETECTED Final   Stenotrophomonas maltophilia NOT DETECTED NOT DETECTED Final   Candida albicans NOT DETECTED NOT DETECTED Final   Candida auris NOT DETECTED NOT DETECTED Final   Candida glabrata NOT DETECTED NOT DETECTED Final   Candida krusei NOT DETECTED NOT DETECTED Final   Candida parapsilosis NOT DETECTED NOT DETECTED Final   Candida tropicalis NOT DETECTED NOT DETECTED Final   Cryptococcus neoformans/gattii NOT DETECTED NOT DETECTED Final   CTX-M ESBL NOT DETECTED NOT DETECTED Final   Carbapenem resistance IMP NOT DETECTED NOT DETECTED Final   Carbapenem resistance KPC NOT DETECTED NOT DETECTED Final   Carbapenem resistance NDM NOT DETECTED NOT DETECTED Final   Carbapenem resist OXA 48 LIKE NOT DETECTED NOT DETECTED Final   Carbapenem resistance VIM NOT DETECTED NOT DETECTED Final    Comment: Performed at [facility], [address] [place],  [address]  Surgical pcr screen     Status: None   Collection Time: 12/30/21  3:11 PM   Specimen: Nasal Mucosa; Nasal Swab  Result Value Ref Range Status   MRSA, PCR NEGATIVE NEGATIVE Final   Staphylococcus aureus NEGATIVE NEGATIVE Final    Comment: (NOTE) The Xpert SA Assay (FDA approved for NASAL specimens in patients 104 years of age and older), is one component of a comprehensive surveillance program. It is not intended to diagnose infection nor to guide or monitor treatment. Performed at [facility], [place] [address] [place], [place] [address]      Medications:    amLODipine  5 mg Oral QHS   aspirin EC  81 mg Oral Daily   atorvastatin  40 mg Oral QHS   bromocriptine  2.5 mg Oral QHS   buPROPion  300 mg Oral q AM   calcitRIOL  1 mcg Oral Q M,W,F-HD   Chlorhexidine Gluconate Cloth  6 each Topical Q0600   Chlorhexidine Gluconate Cloth  6 each Topical Q0600   DULoxetine  20 mg Oral BID   heparin  5,000 Units Subcutaneous Q8H   insulin aspart  0-6 Units Subcutaneous TID WC   melatonin  10 mg Oral QHS   metoprolol tartrate  25 mg Oral BID   midodrine  10 mg Oral Q M,W,F   pantoprazole  40 mg Oral QHS   sevelamer carbonate  2.4 g Oral TID WC   tamsulosin  0.4 mg Oral QHS   urea   Topical BID   Continuous Infusions:  anticoagulant sodium citrate      ceFAZolin (ANCEF) IV     ceFEPime (MAXIPIME) IV 1 g (12/30/21 1218)   tranexamic acid        LOS: 5 days   Charlynne Cousins  [facility]  12/31/2021, 11:55 AM

## 2021-12-31 NOTE — Interval H&P Note (Signed)
History and Physical Interval Note:  12/31/2021 7:04 AM  Michael Riggs  has presented today for surgery, with the diagnosis of Osteomyelitis Right Foot.  The various methods of treatment have been discussed with the patient and family. After consideration of risks, benefits and other options for treatment, the patient has consented to  Procedure(s): RIGHT BELOW KNEE AMPUTATION (Right) as a surgical intervention.  The patient's history has been reviewed, patient examined, no change in status, stable for surgery.  I have reviewed the patient's chart and labs.  Questions were answered to the patient's satisfaction.     [name]

## 2021-12-31 NOTE — Progress Notes (Signed)
Received patient in bed, alert and oriented. Informed consent signed and in chart.  HD treatment completed. Patient tolerated well. Left Fistula without signs and symptoms of complications. Patient transported back to the room, alert and orient and in no acute distress.  Report given to bedside RN.  Total UF removed: 1.9L  Medication given: Midodrine 10mg   Post HD VS:  125/79- BP 94- MAP 63- HR R-10 O2: 96% Glucose: 78mg /dL  Post HD weight: 101.8 kg

## 2021-12-31 NOTE — Progress Notes (Signed)
[facility] Progress Note   Subjective:   For R BKA later today. Reports he is tired and hungry this AM, no other concerns. Denies SOB, CP and dizziness.   Objective Vitals:   12/29/21 2247 12/30/21 0504 12/30/21 1157 12/30/21 2003  BP: (!) 110/95 (!) 101/59 115/78 112/75  Pulse:  68 62 65  Resp:  17 18 17   Temp:   97.8 F (36.6 C) 98 F (36.7 C)  TempSrc:   Oral Oral  SpO2:    95%  Weight:      Height:       Physical Exam General: Alert male in NAD Heart: RRR, no murmurs, rubs or gallops Lungs: CTA bilaterally without wheezing, rhonchi or rales Abdomen: Soft, non-tender, non-distended, +BS Extremities: L BKA, RLE wrapped foot to ankle. No edema notted Dialysis Access:  LUE AVF + t/b    Additional Objective Labs: Basic Metabolic Panel: Recent Labs  Lab 12/27/21 0227 12/28/21 0207 12/29/21 0208 12/31/21 0232  NA 132*  --  128* 130*  K 4.3  --  5.1 4.5  CL 89*  --  85* 89*  CO2 30  --  24 23  GLUCOSE 110*  --  113* 98  BUN 40*  --  81* 71*  CREATININE 5.87*  --  8.77* 7.57*  CALCIUM 9.6 9.5 9.4 9.1  PHOS 5.4*  --  8.9* 7.0*   Liver Function Tests: Recent Labs  Lab 12/25/21 1903 12/27/21 0227 12/29/21 0208 12/31/21 0232  AST 42*  --   --   --   ALT 86*  --   --   --   ALKPHOS 212*  --   --   --   BILITOT 1.3*  --   --   --   PROT 7.8  --   --   --   ALBUMIN 3.2* 3.0* 2.8* 2.8*   No results for input(s): "LIPASE", "AMYLASE" in the last 168 hours. CBC: Recent Labs  Lab 12/25/21 1903 12/26/21 0414 12/27/21 0227 12/31/21 0232  WBC 13.2* 15.5* 14.5* 14.1*  NEUTROABS 10.2* 11.7* 10.6* 11.0*  HGB 12.7* 12.7* 12.5* 12.2*  HCT 40.6 38.8* 38.0* 36.8*  MCV 109.4* 108.1* 106.1* 103.7*  PLT 343 329 353 269   Blood Culture    Component Value Date/Time   SDES BLOOD RIGHT FOREARM 12/26/2021 0040   SPECREQUEST  12/26/2021 0040    BOTTLES DRAWN AEROBIC AND ANAEROBIC Blood Culture adequate volume   CULT MORGANELLA MORGANII (A) 12/26/2021  0040   REPTSTATUS 12/28/2021 FINAL 12/26/2021 0040    Cardiac Enzymes: No results for input(s): "CKTOTAL", "CKMB", "CKMBINDEX", "TROPONINI" in the last 168 hours. CBG: Recent Labs  Lab 12/30/21 0753 12/30/21 1156 12/30/21 1625 12/30/21 2104 12/31/21 0758  GLUCAP 111* 169* 130* 118* 96   Iron Studies: No results for input(s): "IRON", "TIBC", "TRANSFERRIN", "FERRITIN" in the last 72 hours. @lablastinr3 @ Studies/Results: No results found. Medications:   ceFAZolin (ANCEF) IV     ceFEPime (MAXIPIME) IV 1 g (12/30/21 1218)   tranexamic acid      amLODipine  5 mg Oral QHS   aspirin EC  81 mg Oral Daily   atorvastatin  40 mg Oral QHS   bromocriptine  2.5 mg Oral QHS   buPROPion  300 mg Oral q AM   calcitRIOL  1 mcg Oral Q M,W,F-HD   Chlorhexidine Gluconate Cloth  6 each Topical Q0600   Chlorhexidine Gluconate Cloth  6 each Topical Q0600   DULoxetine  20 mg Oral  BID   heparin  5,000 Units Subcutaneous Q8H   insulin aspart  0-6 Units Subcutaneous TID WC   melatonin  10 mg Oral QHS   metoprolol tartrate  25 mg Oral BID   midodrine  10 mg Oral Q M,W,F   pantoprazole  40 mg Oral QHS   sevelamer carbonate  2.4 g Oral TID WC   tamsulosin  0.4 mg Oral QHS   urea   Topical BID    Dialysis Orders: [place] on MWF 180NRe 5 hours BFR 400 DFR 500 EDW 98.4kg 2K 2Ca AVF 15g No heparin/ESA Calcitriol 1 mcg PO q HD    Assessment/Plan: Chronic RLE wound/Osteomyelitis-seen by Dr. Sharol Given for osteomyelitis/gangrene of right heel that has failed efforts at limb salvage and plans noted for right BKA on 6/15.  He remains on antibiotic coverage with cefepime. ESRD - on HD MWF, planned for HD today Hypertension/volume- Euvolemic on exam but  Na+ is decreased. Only had 1.8L UF last HD and had some low BP overnight.It looks like his midodrine was given in the AM rather than prior to HD which is likely the issue. Will attempt 2.5L UF with midodrine prior.  Anemia of CKD -hemoglobin and hematocrit  are currently at goal and he does not have indication for ESA.  No overt blood loss. Secondary Hyperparathyroidism -Corrected Calcium and phosphorus are both elevated. decreased calcitriol dose on 12/29/21. Continue renvela with meals.  Nutrition -Requested regular diet. Will follow potassium and phosphorus levels closely    Anice Paganini, PA-C 12/31/2021, 8:35 AM  [facility] Pager: [phone]

## 2021-12-31 NOTE — Plan of Care (Signed)

## 2022-01-01 ENCOUNTER — Encounter: Payer: Medicare Other | Admitting: Orthopedic Surgery

## 2022-01-01 DIAGNOSIS — M86171 Other acute osteomyelitis, right ankle and foot: Secondary | ICD-10-CM | POA: Diagnosis not present

## 2022-01-01 DIAGNOSIS — N186 End stage renal disease: Secondary | ICD-10-CM | POA: Diagnosis not present

## 2022-01-01 DIAGNOSIS — I10 Essential (primary) hypertension: Secondary | ICD-10-CM | POA: Diagnosis not present

## 2022-01-01 LAB — GLUCOSE, CAPILLARY
Glucose-Capillary: 123 mg/dL — ABNORMAL HIGH (ref 70–99)
Glucose-Capillary: 161 mg/dL — ABNORMAL HIGH (ref 70–99)
Glucose-Capillary: 71 mg/dL (ref 70–99)
Glucose-Capillary: 104 mg/dL — ABNORMAL HIGH (ref 70–99)

## 2022-01-01 MED ORDER — ALUM & MAG HYDROXIDE-SIMETH 200-200-20 MG/5ML PO SUSP: 30.0000 mL | Freq: Four times a day (QID) | ORAL | Status: DC | PRN

## 2022-01-01 MED ORDER — LOPERAMIDE HCL 1 MG/7.5ML PO SUSP
4.0000 mg | Freq: Four times a day (QID) | ORAL | Status: DC | PRN
  Administered 2022-01-01: 4 mg via ORAL
  Filled 2022-01-01 (×2): qty 30

## 2022-01-01 MED ORDER — RENA-VITE PO TABS
1.0000 | ORAL_TABLET | Freq: Every day | ORAL | Status: DC
  Administered 2022-01-01 – 2022-01-05 (×5): 1 via ORAL
  Filled 2022-01-01 (×5): qty 1

## 2022-01-01 MED ORDER — CHLORHEXIDINE GLUCONATE CLOTH 2 % EX PADS
6.0000 | MEDICATED_PAD | Freq: Every day | CUTANEOUS | Status: DC
  Administered 2022-01-01 – 2022-01-06 (×6): 6 via TOPICAL

## 2022-01-01 MED ORDER — NEPHRO-VITE 0.8 MG PO TABS: 1.0000 | ORAL_TABLET | Freq: Every day | ORAL | Status: DC

## 2022-01-01 NOTE — Progress Notes (Signed)
[facility] Progress Note   Subjective:   Surgery was cancelled yesterday, HD unit was told patient's surgery was going to be in the afternoon so they brought him to HD in the morning. Today, pt reports he is feeling well. Denies SOB, dizziness, abdominal pain and nausea.   Objective Vitals:   12/31/21 1516 12/31/21 1604 12/31/21 2007 01/01/22 0632  BP:  (!) 123/94 120/85 123/83  Pulse:  66 71 66  Resp:  16 16 17   Temp:  98.3 F (36.8 C) 98.7 F (37.1 C) 98.8 F (37.1 C)  TempSrc:  Oral  Oral  SpO2:  94% 98% 98%  Weight: 101.8 kg     Height:       Physical Exam General: Alert male in NAD Heart: RRR, no murmurs, rubs or gallops Lungs: CTA bilaterally without wheezing, rhonchi or rales Abdomen: Soft, non-tender, non-distended, +BS Extremities: L BKA, RLE wrapped foot to ankle. No edema notted Dialysis Access:  LUE AVF + t/b    Additional Objective Labs: Basic Metabolic Panel: Recent Labs  Lab 12/27/21 0227 12/28/21 0207 12/29/21 0208 12/31/21 0232  NA 132*  --  128* 130*  K 4.3  --  5.1 4.5  CL 89*  --  85* 89*  CO2 30  --  24 23  GLUCOSE 110*  --  113* 98  BUN 40*  --  81* 71*  CREATININE 5.87*  --  8.77* 7.57*  CALCIUM 9.6 9.5 9.4 9.1  PHOS 5.4*  --  8.9* 7.0*   Liver Function Tests: Recent Labs  Lab 12/25/21 1903 12/27/21 0227 12/29/21 0208 12/31/21 0232  AST 42*  --   --   --   ALT 86*  --   --   --   ALKPHOS 212*  --   --   --   BILITOT 1.3*  --   --   --   PROT 7.8  --   --   --   ALBUMIN 3.2* 3.0* 2.8* 2.8*   No results for input(s): "LIPASE", "AMYLASE" in the last 168 hours. CBC: Recent Labs  Lab 12/25/21 1903 12/26/21 0414 12/27/21 0227 12/31/21 0232  WBC 13.2* 15.5* 14.5* 14.1*  NEUTROABS 10.2* 11.7* 10.6* 11.0*  HGB 12.7* 12.7* 12.5* 12.2*  HCT 40.6 38.8* 38.0* 36.8*  MCV 109.4* 108.1* 106.1* 103.7*  PLT 343 329 353 269   Blood Culture    Component Value Date/Time   SDES BLOOD RIGHT FOREARM 12/26/2021 0040    SPECREQUEST  12/26/2021 0040    BOTTLES DRAWN AEROBIC AND ANAEROBIC Blood Culture adequate volume   CULT MORGANELLA MORGANII (A) 12/26/2021 0040   REPTSTATUS 12/28/2021 FINAL 12/26/2021 0040    Cardiac Enzymes: No results for input(s): "CKTOTAL", "CKMB", "CKMBINDEX", "TROPONINI" in the last 168 hours. CBG: Recent Labs  Lab 12/31/21 1339 12/31/21 1425 12/31/21 1612 12/31/21 2016 01/01/22 0752  GLUCAP 78 88 74 294* 123*   Iron Studies: No results for input(s): "IRON", "TIBC", "TRANSFERRIN", "FERRITIN" in the last 72 hours. @lablastinr3 @ Studies/Results: No results found. Medications:  ceFEPime (MAXIPIME) IV 1 g (12/31/21 1511)    aspirin EC  81 mg Oral Daily   atorvastatin  40 mg Oral QHS   bromocriptine  2.5 mg Oral QHS   buPROPion  300 mg Oral q AM   calcitRIOL  1 mcg Oral Q M,W,F-HD   Chlorhexidine Gluconate Cloth  6 each Topical Q0600   Chlorhexidine Gluconate Cloth  6 each Topical Q0600   DULoxetine  20 mg Oral  BID   heparin  5,000 Units Subcutaneous Q8H   insulin aspart  0-6 Units Subcutaneous TID WC   melatonin  10 mg Oral QHS   metoprolol tartrate  25 mg Oral BID   midodrine  10 mg Oral Q M,W,F   pantoprazole  40 mg Oral QHS   sevelamer carbonate  2.4 g Oral TID WC   tamsulosin  0.4 mg Oral QHS   urea   Topical BID    Dialysis Orders: [place] on MWF 180NRe 5 hours BFR 400 DFR 500 EDW 98.4kg 2K 2Ca AVF 15g No heparin/ESA Calcitriol 1 mcg PO q HD  Assessment/Plan: Chronic RLE wound/Osteomyelitis-seen by Dr. Sharol Given for osteomyelitis/gangrene of right heel that has failed efforts at limb salvage and plans noted for right BKA on 6/17.  He remains on antibiotic coverage with cefepime. ESRD - on HD MWF, tolerating well. We will plan for HD tomorrow after surgery. If surgery is going to be late in the day, please let HD unit know so we can plan his dialysis treatment accordingly.  Hypertension/volume- Euvolemic on exam but  Na+ is decreased. Continue UF as tolerated  with midodrine prior to dialysis.  Anemia of CKD -hemoglobin and hematocrit are currently at goal and he does not have indication for ESA.  No overt blood loss. Secondary Hyperparathyroidism -Corrected Calcium and phosphorus are both elevated. decreased calcitriol dose on 12/29/21. Continue renvela with meals.  Nutrition -Requested regular diet. Will follow potassium and phosphorus levels closely  Anice Paganini, PA-C 01/01/2022, 9:01 AM  [facility] Pager: [phone]

## 2022-01-01 NOTE — Anesthesia Preprocedure Evaluation (Addendum)
Anesthesia Evaluation  Patient identified by MRN, date of birth, ID band Patient awake    Reviewed: Allergy & Precautions, NPO status , Patient's Chart, lab work & pertinent test results  Airway Mallampati: II  TM Distance: >3 FB Neck ROM: Full    Dental no notable dental hx. (+) Teeth Intact, Dental Advisory Given   Pulmonary sleep apnea , former smoker,    Pulmonary exam normal breath sounds clear to auscultation       Cardiovascular hypertension, Pt. on medications and Pt. on home beta blockers + Peripheral Vascular Disease  Normal cardiovascular exam Rhythm:Regular Rate:Normal  TTE 2023 1. Left ventricular ejection fraction, by estimation, is 40%. The left  ventricle has mildly decreased function. The left ventricle demonstrates  global hypokinesis. There is moderate left ventricular hypertrophy. Left  ventricular diastolic parameters are  consistent with Grade III diastolic dysfunction (restrictive).  2. Right ventricular systolic function is low normal. The right  ventricular size is normal. There is mildly elevated pulmonary artery  systolic pressure. The estimated right ventricular systolic pressure is  34.2 mmHg.  3. Right atrial size was mildly dilated.  4. The mitral valve is abnormal. No evidence of mitral valve  regurgitation. No evidence of mitral stenosis.  5. The aortic valve is tricuspid. There is moderate calcification of the  aortic valve. Aortic valve regurgitation is trivial. No aortic stenosis is  present.    Neuro/Psych  Headaches, PSYCHIATRIC DISORDERS Anxiety Depression CVA    GI/Hepatic Neg liver ROS, GERD  ,  Endo/Other  diabetes  Renal/GU Dialysis and ESRFRenal disease (dialysis MWF, last dialysis Wed)  negative genitourinary   Musculoskeletal negative musculoskeletal ROS (+)   Abdominal   Peds  Hematology negative hematology ROS (+)   Anesthesia Other Findings    Reproductive/Obstetrics                           Anesthesia Physical Anesthesia Plan  ASA: 4  Anesthesia Plan: General and Regional   Post-op Pain Management: Regional block* and Tylenol PO (pre-op)*   Induction: Intravenous  PONV Risk Score and Plan: 2 and Ondansetron, Dexamethasone and Midazolam  Airway Management Planned: LMA  Additional Equipment:   Intra-op Plan:   Post-operative Plan: Extubation in OR  Informed Consent: I have reviewed the patients History and Physical, chart, labs and discussed the procedure including the risks, benefits and alternatives for the proposed anesthesia with the patient or authorized representative who has indicated his/her understanding and acceptance.     Dental advisory given  Plan Discussed with: CRNA  Anesthesia Plan Comments:         Anesthesia Quick Evaluation

## 2022-01-01 NOTE — Progress Notes (Signed)
Mobility Specialist Progress Note   01/01/22 1200  Mobility  Activity Stood at bedside  Level of Assistance Minimal assist, patient does 75% or more  Assistive Device Front wheel walker  Activity Response Tolerated well  $Mobility charge 1 Mobility   Received in bed having no pain but requiring mod encouragement for mobility. Min A required to get EOB d/t trunk weakness. Min G for initial stand from an elevated surface but min A to help pt maintain balance. Stood for ~58mins, pt then requesting to sit down after fatigue in RLE. Returned back supine after deferring another attempt to stand. Pt then expressed that they are inclined to practice w/c mobility, if time permits MS will comeback in the afternoon to attempt.   Holland Falling Mobility Specialist Phone Number [phone]

## 2022-01-01 NOTE — H&P (View-Only) (Signed)
[name], male   DOB: 09-09-1970, 51 y.o.   MRN: [id number] Patient's surgery was canceled yesterday secondary to patient being in dialysis during his surgical time.  We will plan for surgery tomorrow morning.  Patient is back on a renal diet.

## 2022-01-01 NOTE — TOC CM/SW Note (Signed)
  Transition of Care [facility]) Screening Note   Patient Details  Name: Michael Riggs Date of Birth: 1971/04/15  Surgery scheduled for today.   Transition of [facility]) CM/SW Contact:    Joanne Chars, LCSW Phone Number: 01/01/2022, 8:26 AM    Transition of Care Department [facility]) has reviewed patient and no TOC needs have been identified at this time. We will continue to monitor patient advancement through interdisciplinary progression rounds. If new patient transition needs arise, please place a TOC consult.

## 2022-01-01 NOTE — Progress Notes (Signed)
[name], male   DOB: 1971-04-10, 51 y.o.   MRN: [id number] Patient's surgery was canceled yesterday secondary to patient being in dialysis during his surgical time.  We will plan for surgery tomorrow morning.  Patient is back on a renal diet.

## 2022-01-01 NOTE — Progress Notes (Signed)
Mobility Specialist Progress Note   01/01/22 1500  Mobility  Activity  (Propelled in w/c)  Level of Assistance Standby assist, set-up cues, supervision of patient - no hands on  Assistive Device Wheelchair  Distance Ambulated (ft)  (Distance Propelled 331ft*)  Activity Response Tolerated well  $Mobility charge 1 Mobility   Pre Mobility: 110/67 BP  Received pt asleep in bed but easily aroused having no complaints. Pt able to get EOB w/ no assist but did report being dizzy. Checked BP and it was stable (110/67). Pt taking an anterior approach to w/c w/o fault and needing no physical assistance. X4 seated rest breaks d/t dec'd endurance +  UE fatigue. Returned back to room being pushed and left in the w/c d/t request by patient. RN notified about location.   Holland Falling Mobility Specialist Phone Number [phone]

## 2022-01-01 NOTE — Plan of Care (Signed)

## 2022-01-01 NOTE — Progress Notes (Signed)
[facility] PROGRESS NOTE    Progress Note  Michael Riggs  HDQ:[id number] DOB: April 01, 1971 DOA: 12/25/2021 PCP: [name], MD     Brief Narrative:   Michael Riggs is an 51 y.o. 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 Sharol Given as an outpatient and told to to the office on 12/26/2031 Return to the emergency room found to have a breakdown of his calcaneus posteriorly found to have osteomyelitis in the right calcaneus area was started on antibiotics and orthopedic surgery was consulted.   Assessment/Plan:   Right leg gangrenous changes of right calcaneus and Achilles tendon osteomyelitis of foot, right, acute [facility]): He had a history of prior angioplasty to the distal SFA with a DES placement 10/06/2021. Was started on IV cefepime Plavix held, continue on aspirin and statins. Patient had failed limb salvage intervention. Orthopedic surgery recommended to proceed with transfer tibial amputation on the right he has agreed scheduled for 01/03/2022. N.p.o. after midnight.  Right lower extremity bleeding: Management per Ortho.  End-stage renal disease: Continue further dialysis per renal, Monday Wednesday and Friday  Transient hypotension: Likely due to missed dose of midodrine. We will continue midodrine Monday Wednesday Friday prior to HD.  Diabetes mellitus type 2: Hold on no oral hypoglycemic agents. We will control does not require insulin in the last 24 hours.  He was started on a diet and n.p.o. after midnight for surgery on 01/02/2022  Essential hypertension: Continue metoprolol twice a day discontinue amlodipine.  Prolactinemia: Continue primidone at bedtime follow-up with endocrinologist as  an outpatient.  Diabetic peripheral neuropathy: Continue Cymbalta and Wellbutrin.  BPH: Continue Flomax.  Obstructive sleep apnea: noted  DVT prophylaxis: lovenox Family Communication:none Status is: Inpatient Remains inpatient appropriate because: Osteomyelitis and gangrenous right foot    Code Status:     Code Status Orders  (From admission, onward)           Start     Ordered   12/26/21 0337  Full code  Continuous        12/26/21 0339           Code Status History     Date Active Date Inactive Code Status Order ID Comments User Context   10/06/2021 1611 10/16/2021 2203 Full Code [id number]  Wells Guiles, DO ED   11/06/2019 0601 11/10/2019 2303 Full Code [id number]  Rise Patience, MD ED   10/11/2019 0645 10/12/2019 2155 Full Code [id number]  Rise Patience, MD Inpatient   05/14/2019 2206 05/24/2019 1435 Full Code [id number]  Gwynne Edinger, MD ED   03/28/2019 1007 04/27/2019 2150 Full Code [id number]  Karmen Bongo, MD Inpatient   02/09/2019 0003 03/14/2019 1528 Full Code [id number]  Cathlyn Parsons, PA-C Inpatient   02/09/2019 0003 02/09/2019 0003 Full Code [id number]  Cathlyn Parsons, PA-C Inpatient   02/04/2019 0546 02/08/2019 2306 Full Code [id number]  Rise Patience, MD Inpatient   01/30/2019 2359 02/03/2019 1539 Full Code [id number]  Rise Patience, MD ED         IV Access:   Peripheral IV   Procedures and diagnostic studies:   No results found.   Medical Consultants:   None.   Subjective:    Chanse Kagel pain is controlled.  Objective:  Vitals:   12/31/21 1516 12/31/21 1604 12/31/21 2007 01/01/22 0632  BP:  (!) 123/94 120/85 123/83  Pulse:  66 71 66  Resp:  16 16 17   Temp:  98.3 F (36.8 C) 98.7 F (37.1 C) 98.8 F (37.1 C)  TempSrc:  Oral  Oral  SpO2:  94% 98% 98%  Weight: 101.8 kg     Height:       SpO2: 98 % O2 Flow Rate (L/min): 2 L/min  No intake or output data in the 24 hours ending  01/01/22 0843 Filed Weights   12/29/21 1609 12/31/21 0931 12/31/21 1516  Weight: 103.5 kg 103.1 kg 101.8 kg    Exam: General exam: In no acute distress. Respiratory system: Good air movement and clear to auscultation. Cardiovascular system: S1 & S2 heard, RRR. No JVD. Gastrointestinal system: Abdomen is nondistended, soft and nontender.  Psychiatry: Judgement and insight appear normal. Mood & affect appropriate.   Data Reviewed:    Labs: Basic Metabolic Panel: Recent Labs  Lab 12/25/21 1903 12/26/21 0414 12/27/21 0227 12/28/21 0207 12/29/21 0208 12/31/21 0232  NA 135 132* 132*  --  128* 130*  K 4.3 5.1 4.3  --  5.1 4.5  CL 88* 90* 89*  --  85* 89*  CO2 28 27 30   --  24 23  GLUCOSE 153* 133* 110*  --  113* 98  BUN 56* 62* 40*  --  81* 71*  CREATININE 7.50* 8.02* 5.87*  --  8.77* 7.57*  CALCIUM 9.7 9.3 9.6 9.5 9.4 9.1  PHOS  --   --  5.4*  --  8.9* 7.0*    GFR Estimated Creatinine Clearance: 14.1 mL/min (A) (by C-G formula based on SCr of 7.57 mg/dL (H)). Liver Function Tests: Recent Labs  Lab 12/25/21 1903 12/27/21 0227 12/29/21 0208 12/31/21 0232  AST 42*  --   --   --   ALT 86*  --   --   --   ALKPHOS 212*  --   --   --   BILITOT 1.3*  --   --   --   PROT 7.8  --   --   --   ALBUMIN 3.2* 3.0* 2.8* 2.8*    No results for input(s): "LIPASE", "AMYLASE" in the last 168 hours. No results for input(s): "AMMONIA" in the last 168 hours. Coagulation profile No results for input(s): "INR", "PROTIME" in the last 168 hours. COVID-19 Labs  No results for input(s): "DDIMER", "FERRITIN", "LDH", "CRP" in the last 72 hours.  Lab Results  Component Value Date   SARSCOV2NAA NEGATIVE 11/06/2019   [place] NEGATIVE 10/11/2019   [place] NEGATIVE 07/06/2019   [place] NEGATIVE 05/21/2019    CBC: Recent Labs  Lab 12/25/21 1903 12/26/21 0414 12/27/21 0227 12/31/21 0232  WBC 13.2* 15.5* 14.5* 14.1*  NEUTROABS 10.2* 11.7* 10.6* 11.0*  HGB 12.7* 12.7*  12.5* 12.2*  HCT 40.6 38.8* 38.0* 36.8*  MCV 109.4* 108.1* 106.1* 103.7*  PLT 343 329 353 269    Cardiac Enzymes: No results for input(s): "CKTOTAL", "CKMB", "CKMBINDEX", "TROPONINI" in the last 168 hours. BNP (last 3 results) No results for input(s): "PROBNP" in the last 8760 hours. CBG: Recent Labs  Lab 12/31/21 1339 12/31/21 1425 12/31/21 1612 12/31/21 2016 01/01/22 0752  GLUCAP 78 88 74 294* 123*    D-Dimer: No results for input(s): "DDIMER" in the last 72 hours. Hgb A1c: No results for input(s): "HGBA1C" in the last 72 hours. Lipid Profile: No results for input(s): "CHOL", "  HDL", "LDLCALC", "TRIG", "CHOLHDL", "LDLDIRECT" in the last 72 hours. Thyroid function studies: No results for input(s): "TSH", "T4TOTAL", "T3FREE", "THYROIDAB" in the last 72 hours.  Invalid input(s): "FREET3" Anemia work up: No results for input(s): "VITAMINB12", "FOLATE", "FERRITIN", "TIBC", "IRON", "RETICCTPCT" in the last 72 hours. Sepsis Labs: Recent Labs  Lab 12/25/21 1903 12/26/21 0414 12/27/21 0227 12/31/21 0232  WBC 13.2* 15.5* 14.5* 14.1*  LATICACIDVEN 1.3  --   --   --     Microbiology Recent Results (from the past 240 hour(s))  Blood culture (routine x 2)     Status: None   Collection Time: 12/25/21  7:03 PM   Specimen: BLOOD  Result Value Ref Range Status   Specimen Description BLOOD RIGHT ANTECUBITAL  Final   Special Requests   Final    BOTTLES DRAWN AEROBIC AND ANAEROBIC Blood Culture adequate volume   Culture   Final    NO GROWTH 5 DAYS Performed at [facility], [address], [place], [place] [address]    Report Status 12/30/2021 FINAL  Final  Blood culture (routine x 2)     Status: Abnormal   Collection Time: 12/26/21 12:40 AM   Specimen: BLOOD RIGHT FOREARM  Result Value Ref Range Status   Specimen Description BLOOD RIGHT FOREARM  Final   Special Requests   Final    BOTTLES DRAWN AEROBIC AND ANAEROBIC Blood Culture adequate volume   Culture  Setup  Time   Final    GRAM NEGATIVE RODS BOTTLES DRAWN AEROBIC ONLY CRITICAL RESULT CALLED TO, READ BACK BY AND VERIFIED WITH: PHARMD [place] ON 12/26/21 @ 2027 BY DRT Performed at [facility], [place] [address], [place],  [address]    Culture [facility] MORGANII (A)  Final   Report Status 12/28/2021 FINAL  Final   Organism ID, Bacteria MORGANELLA MORGANII  Final      Susceptibility   Morganella morganii - MIC*    AMPICILLIN >=32 RESISTANT Resistant     CEFAZOLIN >=64 RESISTANT Resistant     CEFTAZIDIME 2 SENSITIVE Sensitive     CIPROFLOXACIN <=0.25 SENSITIVE Sensitive     GENTAMICIN <=1 SENSITIVE Sensitive     IMIPENEM 2 SENSITIVE Sensitive     TRIMETH/SULFA <=20 SENSITIVE Sensitive     AMPICILLIN/SULBACTAM >=32 RESISTANT Resistant     PIP/TAZO <=4 SENSITIVE Sensitive     * MORGANELLA MORGANII  Blood Culture ID Panel (Reflexed)     Status: Abnormal   Collection Time: 12/26/21 12:40 AM  Result Value Ref Range Status   Enterococcus faecalis NOT DETECTED NOT DETECTED Final   Enterococcus Faecium NOT DETECTED NOT DETECTED Final   Listeria monocytogenes NOT DETECTED NOT DETECTED Final   Staphylococcus species NOT DETECTED NOT DETECTED Final   Staphylococcus aureus (BCID) NOT DETECTED NOT DETECTED Final   Staphylococcus epidermidis NOT DETECTED NOT DETECTED Final   Staphylococcus lugdunensis NOT DETECTED NOT DETECTED Final   Streptococcus species NOT DETECTED NOT DETECTED Final   Streptococcus agalactiae NOT DETECTED NOT DETECTED Final   Streptococcus pneumoniae NOT DETECTED NOT DETECTED Final   Streptococcus pyogenes NOT DETECTED NOT DETECTED Final   A.calcoaceticus-baumannii NOT DETECTED NOT DETECTED Final   Bacteroides fragilis NOT DETECTED NOT DETECTED Final   Enterobacterales DETECTED (A) NOT DETECTED Final    Comment: Enterobacterales represent a large order of gram negative bacteria, not a single organism. Refer to culture for further identification. CRITICAL RESULT  CALLED TO, READ BACK BY AND VERIFIED WITH: PHARMD CARLA [place] ON 12/26/21 @  2027 BY DRT    Enterobacter cloacae complex NOT DETECTED NOT DETECTED Final   Escherichia coli NOT DETECTED NOT DETECTED Final   Klebsiella aerogenes NOT DETECTED NOT DETECTED Final   Klebsiella oxytoca NOT DETECTED NOT DETECTED Final   Klebsiella pneumoniae NOT DETECTED NOT DETECTED Final   Proteus species NOT DETECTED NOT DETECTED Final   Salmonella species NOT DETECTED NOT DETECTED Final   Serratia marcescens NOT DETECTED NOT DETECTED Final   Haemophilus influenzae NOT DETECTED NOT DETECTED Final   Neisseria meningitidis NOT DETECTED NOT DETECTED Final   Pseudomonas aeruginosa NOT DETECTED NOT DETECTED Final   Stenotrophomonas maltophilia NOT DETECTED NOT DETECTED Final   Candida albicans NOT DETECTED NOT DETECTED Final   Candida auris NOT DETECTED NOT DETECTED Final   Candida glabrata NOT DETECTED NOT DETECTED Final   Candida krusei NOT DETECTED NOT DETECTED Final   Candida parapsilosis NOT DETECTED NOT DETECTED Final   Candida tropicalis NOT DETECTED NOT DETECTED Final   Cryptococcus neoformans/gattii NOT DETECTED NOT DETECTED Final   CTX-M ESBL NOT DETECTED NOT DETECTED Final   Carbapenem resistance IMP NOT DETECTED NOT DETECTED Final   Carbapenem resistance KPC NOT DETECTED NOT DETECTED Final   Carbapenem resistance NDM NOT DETECTED NOT DETECTED Final   Carbapenem resist OXA 48 LIKE NOT DETECTED NOT DETECTED Final   Carbapenem resistance VIM NOT DETECTED NOT DETECTED Final    Comment: Performed at [facility], [address], [place], [place] [address]  Surgical pcr screen     Status: None   Collection Time: 12/30/21  3:11 PM   Specimen: Nasal Mucosa; Nasal Swab  Result Value Ref Range Status   MRSA, PCR NEGATIVE NEGATIVE Final   Staphylococcus aureus NEGATIVE NEGATIVE Final    Comment: (NOTE) The Xpert SA Assay (FDA approved for NASAL specimens in patients 97 years of age and older), is one  component of a comprehensive surveillance program. It is not intended to diagnose infection nor to guide or monitor treatment. Performed at [facility], [place] [address], [place], [place] [address]      Medications:    aspirin EC  81 mg Oral Daily   atorvastatin  40 mg Oral QHS   bromocriptine  2.5 mg Oral QHS   buPROPion  300 mg Oral q AM   calcitRIOL  1 mcg Oral Q M,W,F-HD   Chlorhexidine Gluconate Cloth  6 each Topical Q0600   Chlorhexidine Gluconate Cloth  6 each Topical Q0600   DULoxetine  20 mg Oral BID   heparin  5,000 Units Subcutaneous Q8H   insulin aspart  0-6 Units Subcutaneous TID WC   melatonin  10 mg Oral QHS   metoprolol tartrate  25 mg Oral BID   midodrine  10 mg Oral Q M,W,F   pantoprazole  40 mg Oral QHS   sevelamer carbonate  2.4 g Oral TID WC   tamsulosin  0.4 mg Oral QHS   urea   Topical BID   Continuous Infusions:  ceFEPime (MAXIPIME) IV 1 g (12/31/21 1511)      LOS: 6 days   Charlynne Cousins  [facility]  01/01/2022, 8:43 AM

## 2022-01-02 ENCOUNTER — Encounter (HOSPITAL_COMMUNITY)
Admission: EM | Disposition: A | Discharge status: Rehabilitation Facility | Payer: Self-pay | Source: Home / Self Care | Attending: Internal Medicine

## 2022-01-02 ENCOUNTER — Encounter (HOSPITAL_COMMUNITY): Payer: Self-pay | Admitting: Internal Medicine

## 2022-01-02 ENCOUNTER — Inpatient Hospital Stay (HOSPITAL_COMMUNITY): Payer: Medicare Other | Admitting: Certified Registered"

## 2022-01-02 ENCOUNTER — Other Ambulatory Visit: Payer: Self-pay

## 2022-01-02 DIAGNOSIS — I120 Hypertensive chronic kidney disease with stage 5 chronic kidney disease or end stage renal disease: Secondary | ICD-10-CM

## 2022-01-02 DIAGNOSIS — E1122 Type 2 diabetes mellitus with diabetic chronic kidney disease: Secondary | ICD-10-CM | POA: Diagnosis not present

## 2022-01-02 DIAGNOSIS — E1151 Type 2 diabetes mellitus with diabetic peripheral angiopathy without gangrene: Secondary | ICD-10-CM

## 2022-01-02 DIAGNOSIS — M869 Osteomyelitis, unspecified: Secondary | ICD-10-CM | POA: Diagnosis not present

## 2022-01-02 DIAGNOSIS — N186 End stage renal disease: Secondary | ICD-10-CM

## 2022-01-02 DIAGNOSIS — Z992 Dependence on renal dialysis: Secondary | ICD-10-CM

## 2022-01-02 DIAGNOSIS — I10 Essential (primary) hypertension: Secondary | ICD-10-CM | POA: Diagnosis not present

## 2022-01-02 DIAGNOSIS — M86171 Other acute osteomyelitis, right ankle and foot: Secondary | ICD-10-CM | POA: Diagnosis not present

## 2022-01-02 HISTORY — PX: AMPUTATION: SHX166

## 2022-01-02 LAB — BASIC METABOLIC PANEL
Anion gap: 18 — ABNORMAL HIGH (ref 5–15)
BUN: 71 mg/dL — ABNORMAL HIGH (ref 6–20)
CO2: 22 mmol/L (ref 22–32)
Calcium: 9.2 mg/dL (ref 8.9–10.3)
Chloride: 89 mmol/L — ABNORMAL LOW (ref 98–111)
Creatinine, Ser: 7.39 mg/dL — ABNORMAL HIGH (ref 0.61–1.24)
GFR, Estimated: 8 mL/min — ABNORMAL LOW (ref >60)
Glucose, Bld: 136 mg/dL — ABNORMAL HIGH (ref 70–99)
Potassium: 4.5 mmol/L (ref 3.5–5.1)
Sodium: 129 mmol/L — ABNORMAL LOW (ref 135–145)

## 2022-01-02 LAB — GLUCOSE, CAPILLARY
Glucose-Capillary: 153 mg/dL — ABNORMAL HIGH (ref 70–99)
Glucose-Capillary: 128 mg/dL — ABNORMAL HIGH (ref 70–99)
Glucose-Capillary: 101 mg/dL — ABNORMAL HIGH (ref 70–99)
Glucose-Capillary: 111 mg/dL — ABNORMAL HIGH (ref 70–99)
Glucose-Capillary: 137 mg/dL — ABNORMAL HIGH (ref 70–99)
Glucose-Capillary: 180 mg/dL — ABNORMAL HIGH (ref 70–99)

## 2022-01-02 LAB — CBC
HCT: 34.6 % — ABNORMAL LOW (ref 39.0–52.0)
Hemoglobin: 11.4 g/dL — ABNORMAL LOW (ref 13.0–17.0)
MCH: 35.3 pg — ABNORMAL HIGH (ref 26.0–34.0)
MCHC: 32.9 g/dL (ref 30.0–36.0)
MCV: 107.1 fL — ABNORMAL HIGH (ref 80.0–100.0)
Platelets: 212 10*3/uL (ref 150–400)
RBC: 3.23 MIL/uL — ABNORMAL LOW (ref 4.22–5.81)
RDW: 16 % — ABNORMAL HIGH (ref 11.5–15.5)
WBC: 14.4 10*3/uL — ABNORMAL HIGH (ref 4.0–10.5)
nRBC: 0 % (ref 0.0–0.2)

## 2022-01-02 SURGERY — AMPUTATION BELOW KNEE
Anesthesia: Regional | Site: Knee | Laterality: Right

## 2022-01-02 MED ORDER — OXYCODONE HCL 5 MG PO TABS
5.0000 mg | ORAL_TABLET | ORAL | Status: DC | PRN
  Administered 2022-01-02: 10 mg via ORAL
  Filled 2022-01-02: qty 2

## 2022-01-02 MED ORDER — MIDAZOLAM HCL 2 MG/2ML IJ SOLN
INTRAMUSCULAR | Status: AC
  Filled 2022-01-02: qty 2

## 2022-01-02 MED ORDER — FENTANYL CITRATE (PF) 100 MCG/2ML IJ SOLN: 25.0000 ug | INTRAMUSCULAR | Status: DC | PRN

## 2022-01-02 MED ORDER — FENTANYL CITRATE (PF) 100 MCG/2ML IJ SOLN
INTRAMUSCULAR | Status: AC
  Administered 2022-01-02: 25 ug via INTRAVENOUS
  Filled 2022-01-02: qty 2

## 2022-01-02 MED ORDER — ZINC SULFATE 220 (50 ZN) MG PO CAPS
220.0000 mg | ORAL_CAPSULE | Freq: Every day | ORAL | Status: DC
  Administered 2022-01-02 – 2022-01-06 (×4): 220 mg via ORAL
  Filled 2022-01-02 (×4): qty 1

## 2022-01-02 MED ORDER — ALUM & MAG HYDROXIDE-SIMETH 200-200-20 MG/5ML PO SUSP: 15.0000 mL | ORAL | Status: DC | PRN

## 2022-01-02 MED ORDER — CEFAZOLIN SODIUM 1 G IJ SOLR
INTRAMUSCULAR | Status: AC
  Filled 2022-01-02: qty 20

## 2022-01-02 MED ORDER — ACETAMINOPHEN 325 MG PO TABS
325.0000 mg | ORAL_TABLET | Freq: Four times a day (QID) | ORAL | Status: DC | PRN
  Administered 2022-01-05: 650 mg via ORAL
  Filled 2022-01-02: qty 2

## 2022-01-02 MED ORDER — HYDROMORPHONE HCL 1 MG/ML IJ SOLN
0.5000 mg | INTRAMUSCULAR | Status: DC | PRN
  Administered 2022-01-02: 1 mg via INTRAVENOUS
  Filled 2022-01-02: qty 1

## 2022-01-02 MED ORDER — MAGNESIUM CITRATE PO SOLN: 1.0000 | Freq: Once | ORAL | Status: DC | PRN

## 2022-01-02 MED ORDER — CEFAZOLIN SODIUM-DEXTROSE 2-3 GM-%(50ML) IV SOLR
INTRAVENOUS | Status: DC | PRN
  Administered 2022-01-02: 2 g via INTRAVENOUS

## 2022-01-02 MED ORDER — ASCORBIC ACID 500 MG PO TABS
1000.0000 mg | ORAL_TABLET | Freq: Every day | ORAL | Status: DC
  Administered 2022-01-02 – 2022-01-06 (×4): 1000 mg via ORAL
  Filled 2022-01-02 (×4): qty 2

## 2022-01-02 MED ORDER — METOPROLOL TARTRATE 5 MG/5ML IV SOLN: 2.0000 mg | INTRAVENOUS | Status: DC | PRN

## 2022-01-02 MED ORDER — FENTANYL CITRATE (PF) 250 MCG/5ML IJ SOLN
INTRAMUSCULAR | Status: AC
  Filled 2022-01-02: qty 5

## 2022-01-02 MED ORDER — 0.9 % SODIUM CHLORIDE (POUR BTL) OPTIME
TOPICAL | Status: DC | PRN
  Administered 2022-01-02: 1000 mL

## 2022-01-02 MED ORDER — PHENOL 1.4 % MT LIQD: 1.0000 | OROMUCOSAL | Status: DC | PRN

## 2022-01-02 MED ORDER — MIDAZOLAM HCL 2 MG/2ML IJ SOLN
INTRAMUSCULAR | Status: AC
  Administered 2022-01-02: 1 mg via INTRAVENOUS
  Filled 2022-01-02: qty 2

## 2022-01-02 MED ORDER — HYDRALAZINE HCL 20 MG/ML IJ SOLN: 5.0000 mg | INTRAMUSCULAR | Status: DC | PRN

## 2022-01-02 MED ORDER — METOPROLOL TARTRATE 12.5 MG HALF TABLET
ORAL_TABLET | ORAL | Status: AC
  Administered 2022-01-02: 25 mg via ORAL
  Filled 2022-01-02: qty 2

## 2022-01-02 MED ORDER — MAGNESIUM SULFATE 2 GM/50ML IV SOLN: 2.0000 g | Freq: Every day | INTRAVENOUS | Status: DC | PRN

## 2022-01-02 MED ORDER — ONDANSETRON HCL 4 MG/2ML IJ SOLN: 4.0000 mg | Freq: Four times a day (QID) | INTRAMUSCULAR | Status: DC | PRN

## 2022-01-02 MED ORDER — JUVEN PO PACK
1.0000 | PACK | Freq: Two times a day (BID) | ORAL | Status: DC
Start: 2022-01-02 — End: 2022-01-06
  Administered 2022-01-02 – 2022-01-06 (×7): 1 via ORAL
  Filled 2022-01-02 (×7): qty 1

## 2022-01-02 MED ORDER — INSULIN ASPART 100 UNIT/ML IJ SOLN: 0.0000 [IU] | INTRAMUSCULAR | Status: DC | PRN

## 2022-01-02 MED ORDER — PROPOFOL 10 MG/ML IV BOLUS
INTRAVENOUS | Status: AC
  Filled 2022-01-02: qty 20

## 2022-01-02 MED ORDER — PANTOPRAZOLE SODIUM 40 MG PO TBEC
40.0000 mg | DELAYED_RELEASE_TABLET | Freq: Every day | ORAL | Status: DC
  Administered 2022-01-02 – 2022-01-06 (×4): 40 mg via ORAL
  Filled 2022-01-02 (×4): qty 1

## 2022-01-02 MED ORDER — MIDAZOLAM HCL 2 MG/2ML IJ SOLN: 1.0000 mg | Freq: Once | INTRAMUSCULAR | Status: AC

## 2022-01-02 MED ORDER — SODIUM CHLORIDE 0.9 % IV SOLN: INTRAVENOUS | Status: DC | PRN

## 2022-01-02 MED ORDER — PROPOFOL 10 MG/ML IV BOLUS
INTRAVENOUS | Status: DC | PRN
  Administered 2022-01-02 (×2): 20 mg via INTRAVENOUS
  Administered 2022-01-02: 10 mg via INTRAVENOUS

## 2022-01-02 MED ORDER — LABETALOL HCL 5 MG/ML IV SOLN: 10.0000 mg | INTRAVENOUS | Status: DC | PRN

## 2022-01-02 MED ORDER — CHLORHEXIDINE GLUCONATE 0.12 % MT SOLN
OROMUCOSAL | Status: AC
  Filled 2022-01-02: qty 15

## 2022-01-02 MED ORDER — FENTANYL CITRATE (PF) 100 MCG/2ML IJ SOLN: 25.0000 ug | Freq: Once | INTRAMUSCULAR | Status: AC

## 2022-01-02 MED ORDER — DEXAMETHASONE SODIUM PHOSPHATE 10 MG/ML IJ SOLN
INTRAMUSCULAR | Status: DC | PRN
  Administered 2022-01-02 (×2): 5 mg

## 2022-01-02 MED ORDER — POTASSIUM CHLORIDE CRYS ER 20 MEQ PO TBCR: 20.0000 meq | EXTENDED_RELEASE_TABLET | Freq: Every day | ORAL | Status: DC | PRN

## 2022-01-02 MED ORDER — CEFAZOLIN SODIUM-DEXTROSE 2-4 GM/100ML-% IV SOLN
2.0000 g | Freq: Three times a day (TID) | INTRAVENOUS | Status: AC
  Administered 2022-01-02 (×2): 2 g via INTRAVENOUS
  Filled 2022-01-02 (×3): qty 100

## 2022-01-02 MED ORDER — POLYETHYLENE GLYCOL 3350 17 G PO PACK: 17.0000 g | PACK | Freq: Every day | ORAL | Status: DC | PRN

## 2022-01-02 MED ORDER — SODIUM CHLORIDE 0.9 % IV SOLN: INTRAVENOUS | Status: DC

## 2022-01-02 MED ORDER — BISACODYL 5 MG PO TBEC: 5.0000 mg | DELAYED_RELEASE_TABLET | Freq: Every day | ORAL | Status: DC | PRN

## 2022-01-02 MED ORDER — ROPIVACAINE HCL 5 MG/ML IJ SOLN
INTRAMUSCULAR | Status: DC | PRN
  Administered 2022-01-02: 20 mL via PERINEURAL
  Administered 2022-01-02: 30 mL via PERINEURAL

## 2022-01-02 MED ORDER — LIDOCAINE HCL (CARDIAC) PF 100 MG/5ML IV SOSY
PREFILLED_SYRINGE | INTRAVENOUS | Status: DC | PRN
  Administered 2022-01-02: 40 mg via INTRAVENOUS

## 2022-01-02 MED ORDER — OXYCODONE HCL 5 MG PO TABS
10.0000 mg | ORAL_TABLET | ORAL | Status: DC | PRN
  Administered 2022-01-03 – 2022-01-06 (×4): 15 mg via ORAL
  Filled 2022-01-02 (×6): qty 3

## 2022-01-02 MED ORDER — GUAIFENESIN-DM 100-10 MG/5ML PO SYRP: 15.0000 mL | ORAL_SOLUTION | ORAL | Status: DC | PRN

## 2022-01-02 MED ORDER — DOCUSATE SODIUM 100 MG PO CAPS
100.0000 mg | ORAL_CAPSULE | Freq: Every day | ORAL | Status: DC
  Administered 2022-01-04: 100 mg via ORAL
  Filled 2022-01-02 (×3): qty 1

## 2022-01-02 SURGICAL SUPPLY — 39 items
BAG COUNTER SPONGE SURGICOUNT (BAG) IMPLANT
BAG SPNG CNTER NS LX DISP (BAG)
BLADE SAW RECIP 87.9 MT (BLADE) ×2 IMPLANT
BLADE SURG 21 STRL SS (BLADE) ×2 IMPLANT
BNDG COHESIVE 6X5 TAN STRL LF (GAUZE/BANDAGES/DRESSINGS) IMPLANT
CANISTER WOUND CARE 500ML ATS (WOUND CARE) ×2 IMPLANT
CANISTER WOUNDNEG PRESSURE 500 (CANNISTER) ×1 IMPLANT
COVER SURGICAL LIGHT HANDLE (MISCELLANEOUS) ×2 IMPLANT
CUFF TOURN SGL QUICK 34 (TOURNIQUET CUFF) ×2
CUFF TRNQT CYL 34X4.125X (TOURNIQUET CUFF) ×1 IMPLANT
DRAPE INCISE IOBAN 66X45 STRL (DRAPES) ×2 IMPLANT
DRAPE U-SHAPE 47X51 STRL (DRAPES) ×2 IMPLANT
DRESSING PREVENA PLUS CUSTOM (GAUZE/BANDAGES/DRESSINGS) ×1 IMPLANT
DRSG PREVENA PLUS CUSTOM (GAUZE/BANDAGES/DRESSINGS) ×2
DURAPREP 26ML APPLICATOR (WOUND CARE) ×2 IMPLANT
ELECT REM PT RETURN 9FT ADLT (ELECTROSURGICAL) ×2
ELECTRODE REM PT RTRN 9FT ADLT (ELECTROSURGICAL) ×1 IMPLANT
GLOVE BIOGEL PI IND STRL 9 (GLOVE) ×1 IMPLANT
GLOVE BIOGEL PI INDICATOR 9 (GLOVE) ×1
GLOVE SURG ORTHO 9.0 STRL STRW (GLOVE) ×2 IMPLANT
GOWN STRL REUS W/ TWL XL LVL3 (GOWN DISPOSABLE) ×2 IMPLANT
GOWN STRL REUS W/TWL XL LVL3 (GOWN DISPOSABLE) ×4
KIT BASIN OR (CUSTOM PROCEDURE TRAY) ×2 IMPLANT
KIT TURNOVER KIT B (KITS) ×2 IMPLANT
MANIFOLD NEPTUNE II (INSTRUMENTS) ×2 IMPLANT
NS IRRIG 1000ML POUR BTL (IV SOLUTION) ×2 IMPLANT
PACK ORTHO EXTREMITY (CUSTOM PROCEDURE TRAY) ×2 IMPLANT
PAD ARMBOARD 7.5X6 YLW CONV (MISCELLANEOUS) ×2 IMPLANT
PREVENA RESTOR ARTHOFORM 46X30 (CANNISTER) ×2 IMPLANT
SPONGE T-LAP 18X18 ~~LOC~~+RFID (SPONGE) IMPLANT
STAPLER VISISTAT 35W (STAPLE) IMPLANT
STOCKINETTE IMPERVIOUS LG (DRAPES) ×2 IMPLANT
SUT ETHILON 2 0 PSLX (SUTURE) IMPLANT
SUT SILK 2 0 (SUTURE) ×2
SUT SILK 2-0 18XBRD TIE 12 (SUTURE) ×1 IMPLANT
SUT VIC AB 1 CTX 27 (SUTURE) ×4 IMPLANT
TOWEL GREEN STERILE (TOWEL DISPOSABLE) ×2 IMPLANT
TUBE CONNECTING 12X1/4 (SUCTIONS) ×2 IMPLANT
YANKAUER SUCT BULB TIP NO VENT (SUCTIONS) ×2 IMPLANT

## 2022-01-02 NOTE — Anesthesia Procedure Notes (Addendum)
Anesthesia Regional Block: Adductor canal block   Pre-Anesthetic Checklist: , timeout performed,  Correct Patient, Correct Site, Correct Laterality,  Correct Procedure, Correct Position, site marked,  Risks and benefits discussed,  Pre-op evaluation,  At surgeon's request and post-op pain management  Laterality: Right  Prep: Maximum Sterile Barrier Precautions used, chloraprep       Needles:  Injection technique: Single-shot  Needle Type: Echogenic Stimulator Needle     Needle Length: 9cm  Needle Gauge: 21     Additional Needles:   Procedures:,,,, ultrasound used (permanent image in chart),,    Narrative:  Start time: 01/02/2022 8:18 AM End time: 01/02/2022 8:22 AM Injection made incrementally with aspirations every 5 mL. Anesthesiologist: Freddrick March, MD

## 2022-01-02 NOTE — Interval H&P Note (Signed)
History and Physical Interval Note:  01/02/2022 6:40 AM  Michael Riggs  has presented today for surgery, with the diagnosis of Osteomyelitis Right Foot.  The various methods of treatment have been discussed with the patient and family. After consideration of risks, benefits and other options for treatment, the patient has consented to  Procedure(s): RIGHT BELOW KNEE AMPUTATION (Right) as a surgical intervention.  The patient's history has been reviewed, patient examined, no change in status, stable for surgery.  I have reviewed the patient's chart and labs.  Questions were answered to the patient's satisfaction.     [name]

## 2022-01-02 NOTE — Progress Notes (Signed)
Orthopedic Tech Progress Note Patient Details:  Michael Riggs Nov 29, 1970 [id number]  Patient ID: Maurilio Lovely, male   DOB: [birth year], 51 y.o.   MRN: [id number] Order placed with [employer] for outside vendor brace.  Vernona Rieger 01/02/2022, 9:24 AM

## 2022-01-02 NOTE — Transfer of Care (Signed)
Immediate Anesthesia Transfer of Care Note  Patient: Michael Riggs  Procedure(s) Performed: RIGHT BELOW KNEE AMPUTATION (Right: Knee)  Patient Location: PACU  Anesthesia Type:MAC and Regional  Level of Consciousness: drowsy  Airway & Oxygen Therapy: Patient Spontanous Breathing  Post-op Assessment: Report given to RN and Post -op Vital signs reviewed and stable  Post vital signs: Reviewed and stable  Last Vitals:  Vitals Value Taken Time  BP 93/68 01/02/22 0916  Temp    Pulse    Resp 13 01/02/22 0920  SpO2    Vitals shown include unvalidated device data.  Last Pain:  Vitals:   01/02/22 0820  TempSrc:   PainSc: 0-No pain      Patients Stated Pain Goal: 0 (88/11/03 1594)  Complications: No notable events documented.

## 2022-01-02 NOTE — Anesthesia Procedure Notes (Signed)
Anesthesia Regional Block: Popliteal block   Pre-Anesthetic Checklist: , timeout performed,  Correct Patient, Correct Site, Correct Laterality,  Correct Procedure, Correct Position, site marked,  Risks and benefits discussed,  Pre-op evaluation,  At surgeon's request and post-op pain management  Laterality: Right  Prep: Maximum Sterile Barrier Precautions used, chloraprep       Needles:  Injection technique: Single-shot  Needle Type: Echogenic Stimulator Needle     Needle Length: 9cm  Needle Gauge: 21     Additional Needles:   Procedures:, nerve stimulator,,, ultrasound used (permanent image in chart),,     Nerve Stimulator or Paresthesia:  Response: Peroneal Response: Tibial  Additional Responses:   Narrative:  Start time: 01/02/2022 8:14 AM End time: 01/02/2022 8:18 AM Injection made incrementally with aspirations every 5 mL. Anesthesiologist: Freddrick March, MD

## 2022-01-02 NOTE — Op Note (Signed)
01/02/2022  9:23 AM  PATIENT:  Michael Riggs    PRE-OPERATIVE DIAGNOSIS:  Osteomyelitis Right Foot  POST-OPERATIVE DIAGNOSIS:  Same  PROCEDURE:  RIGHT BELOW KNEE AMPUTATION  SURGEON:  [name], MD  ANESTHESIA:   General  PREOPERATIVE INDICATIONS:  [name] is a  51 y.o. male with a diagnosis of Osteomyelitis Right Foot who failed conservative measures and elected for surgical management.    The risks benefits and alternatives were discussed with the patient preoperatively including but not limited to the risks of infection, bleeding, nerve injury, cardiopulmonary complications, the need for revision surgery, among others, and the patient was willing to proceed.  OPERATIVE IMPLANTS: vac   OPERATIVE FINDINGS: ischemic muscle changes, no abscess  OPERATIVE PROCEDURE: Patient was brought to the operating room after undergoing a regional anesthetic.  After adequate levels anesthesia were obtained a thigh tourniquet was placed and the lower extremity was prepped using DuraPrep draped into a sterile field. The foot was draped out of the sterile field with impervious stockinette.  A timeout was called and the tourniquet inflated.  A transverse skin incision was made 12 cm distal to the tibial tubercle, the incision curved proximally, and a large posterior flap was created.  The tibia was transected just proximal to the skin incision and beveled anteriorly.  The fibula was transected just proximal to the tibial incision.  The sciatic nerve was pulled cut and allowed to retract.  The vascular bundles were suture ligated with 2-0 silk.  The tourniquet was deflated and hemostasis obtained.  The deep and superficial fascial layers were closed using #1 Vicryl.  The skin was closed using staples.  The Prevena customizable dressing was applied this was overwrapped with the arthroform sponge.  Charlie Pitter was used to secure the sponges and the circumferential compression was secured to the  skin with Dermatac.  This was connected to the wound VAC pump and had a good suction fit this was covered with a stump shrinker and a limb protector.  Patient was taken to the PACU in stable condition.   DISCHARGE PLANNING:  Antibiotic duration: 24-hour antibiotics  Weightbearing: Nonweightbearing on the operative extremity  Pain medication: Opioid pathway  Dressing care/ Wound VAC: Continue wound VAC with the Prevena plus pump at discharge for 1 week  Ambulatory devices: Walker or kneeling scooter  Discharge to: Discharge planning based on recommendations per physical therapy  Follow-up: In the office 1 week after discharge.

## 2022-01-02 NOTE — Progress Notes (Signed)
[facility] Progress Note   Subjective:  Seen in room. S/p R BKA this AM, wound vac in place. + pain. + mild dyspnea as well, will be dialzyed this afternoon. Mood seems pretty down today, which is expected.  Objective Vitals:   01/02/22 0915 01/02/22 0930 01/02/22 0945 01/02/22 0955  BP: 93/68 98/73 90/71  99/71  Pulse: (!) 57 (!) 57 (!) 57 60  Resp: 13 10 14 18   Temp: 98.4 F (36.9 C)  98.4 F (36.9 C) 99.3 F (37.4 C)  TempSrc:      SpO2: 95% 100% 100% 100%  Weight:      Height:       Physical Exam General: Chronically ill appearing man, NAD. Room air Heart: RRR; no murmur Lungs: CTA anteriorly Abdomen: soft, non-tender Extremities: R BKA with wound vac in place, L BKA with scattered dark eschar wounds Dialysis Access: LUE AVF + bruit  Additional Objective Labs: Basic Metabolic Panel: Recent Labs  Lab 12/27/21 0227 12/28/21 0207 12/29/21 0208 12/31/21 0232 01/02/22 0700  NA 132*  --  128* 130* 129*  K 4.3  --  5.1 4.5 4.5  CL 89*  --  85* 89* 89*  CO2 30  --  24 23 22   GLUCOSE 110*  --  113* 98 136*  BUN 40*  --  81* 71* 71*  CREATININE 5.87*  --  8.77* 7.57* 7.39*  CALCIUM 9.6   < > 9.4 9.1 9.2  PHOS 5.4*  --  8.9* 7.0*  --    < > = values in this interval not displayed.   Liver Function Tests: Recent Labs  Lab 12/27/21 0227 12/29/21 0208 12/31/21 0232  ALBUMIN 3.0* 2.8* 2.8*   CBC: Recent Labs  Lab 12/27/21 0227 12/31/21 0232 01/02/22 0700  WBC 14.5* 14.1* 14.4*  NEUTROABS 10.6* 11.0*  --   HGB 12.5* 12.2* 11.4*  HCT 38.0* 36.8* 34.6*  MCV 106.1* 103.7* 107.1*  PLT 353 269 212   Blood Culture    Component Value Date/Time   SDES BLOOD RIGHT FOREARM 12/26/2021 0040   SPECREQUEST  12/26/2021 0040    BOTTLES DRAWN AEROBIC AND ANAEROBIC Blood Culture adequate volume   CULT MORGANELLA MORGANII (A) 12/26/2021 0040   REPTSTATUS 12/28/2021 FINAL 12/26/2021 0040   Medications:  sodium chloride 10 mL/hr at 01/02/22 1213     ceFAZolin (ANCEF) IV     ceFEPime (MAXIPIME) IV 1 g (01/02/22 1214)   magnesium sulfate bolus IVPB      vitamin C  1,000 mg Oral Daily   aspirin EC  81 mg Oral Daily   atorvastatin  40 mg Oral QHS   bromocriptine  2.5 mg Oral QHS   buPROPion  300 mg Oral q AM   calcitRIOL  1 mcg Oral Q M,W,F-HD   chlorhexidine       Chlorhexidine Gluconate Cloth  6 each Topical Q0600   Chlorhexidine Gluconate Cloth  6 each Topical Q0600   Chlorhexidine Gluconate Cloth  6 each Topical Q0600   [START ON 01/03/2022] docusate sodium  100 mg Oral Daily   DULoxetine  20 mg Oral BID   heparin  5,000 Units Subcutaneous Q8H   insulin aspart  0-6 Units Subcutaneous TID WC   melatonin  10 mg Oral QHS   metoprolol tartrate  25 mg Oral BID   midodrine  10 mg Oral Q M,W,F   multivitamin  1 tablet Oral QHS   nutrition supplement (JUVEN)  1 packet Oral BID BM  pantoprazole  40 mg Oral Daily   sevelamer carbonate  2.4 g Oral TID WC   tamsulosin  0.4 mg Oral QHS   urea   Topical BID   zinc sulfate  220 mg Oral Daily    Dialysis Orders: [facility] on MWF 180NRe 5 hours BFR 400 DFR 500 EDW 98.4kg 2K 2Ca AVF 15g - No heparin/ESA - Calcitriol 1 mcg PO q HD  Assessment/Plan: Chronic RLE wound/osteomyelitis: Failed limb salvage attempts, 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 Wound vac in place. Per ortho. On Cefazolin + Cefepime. ESRD: Continue HD on usual MWF schedule - for HD this afternoon. No heparin. BP/volume: BP has been low, amlodipine d/c'd. Remains on metop BID with holding parameters. Using midodrine pre-HD, + dyspnea this AM - UF as tolerated. EDW will need to be adjusted s/p amputation. Anemia of ESRD: Hgb 11.4, watch for post-op drop over the next few days. No ESA for nwo. Secondary Hyperparathyroidism: CorrCa high sided, calcitriol reduced 58mcg -> 71mcg q HD. Phos high, On Renvela pwdr 2.4g TIW with meals. ?Scattered dark wounds with eschar, low threshold to resume NaThiosulfate. Nutrition: Alb low, continue  supplements. T2DM   Veneta Penton, Hershal Coria 01/02/2022, 1:07 PM  [employer]

## 2022-01-02 NOTE — Progress Notes (Signed)
[facility] PROGRESS NOTE    Progress Note  Michael Riggs  NAT:[id number] DOB: 10-11-1970 DOA: 12/25/2021 PCP: [name], MD     Brief Narrative:   Michael Riggs is an 51 y.o. 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 Sharol Given as an outpatient and told to to the office on 12/26/2031 Return to the emergency room found to have a breakdown of his calcaneus posteriorly found to have osteomyelitis in the right calcaneus area was started on antibiotics and orthopedic surgery was consulted.   Assessment/Plan:   Right leg gangrenous changes of right calcaneus and Achilles tendon osteomyelitis of foot, right, acute [facility]): He had a history of prior angioplasty to the distal SFA with a DES placement 10/06/2021. Was started on IV cefepime Plavix held, continue on aspirin and statins. Renal limb salvage intervention status post right below the knee amputation on 01/02/2022. Allow with diet. Orthopedic to dictate when the patient can start working with physical therapy.  Right lower extremity bleeding: Management per Ortho.  End-stage renal disease: Continue further dialysis per renal, Monday Wednesday and Friday. For HD today.  Transient hypotension: Likely due to missed dose of midodrine. We will continue midodrine Monday Wednesday Friday prior to HD.  Diabetes mellitus type 2: Hold on no oral hypoglycemic agents. We will control does not require insulin in the last 24 hours.  He was started on a diet and n.p.o. after midnight for surgery on 01/02/2022  Essential hypertension: Continue metoprolol twice a day discontinue amlodipine.  Prolactinemia: Continue primidone at bedtime follow-up with  endocrinologist as an outpatient.  Diabetic peripheral neuropathy: Continue Cymbalta and Wellbutrin.  BPH: Continue Flomax.  Obstructive sleep apnea: noted  DVT prophylaxis: lovenox Family Communication:none Status is: Inpatient Remains inpatient appropriate because: Osteomyelitis and gangrenous right foot    Code Status:     Code Status Orders  (From admission, onward)           Start     Ordered   12/26/21 0337  Full code  Continuous        12/26/21 0339           Code Status History     Date Active Date Inactive Code Status Order ID Comments User Context   10/06/2021 1611 10/16/2021 2203 Full Code [id number]  Wells Guiles, DO ED   11/06/2019 0601 11/10/2019 2303 Full Code [id number]  Rise Patience, MD ED   10/11/2019 0645 10/12/2019 2155 Full Code [id number]  Rise Patience, MD Inpatient   05/14/2019 2206 05/24/2019 1435 Full Code [id number]  Gwynne Edinger, MD ED   03/28/2019 1007 04/27/2019 2150 Full Code [id number]  Karmen Bongo, MD Inpatient   02/09/2019 0003 03/14/2019 1528 Full Code [id number]  Cathlyn Parsons, PA-C Inpatient   02/09/2019 0003 02/09/2019 0003 Full Code [id number]  Cathlyn Parsons, PA-C Inpatient   02/04/2019 0546 02/08/2019 2306 Full Code [id number]  Rise Patience, MD Inpatient   01/30/2019 2359 02/03/2019 1539 Full Code [id number]  Rise Patience, MD ED         IV Access:   Peripheral IV   Procedures and diagnostic studies:   No results found.   Medical Consultants:   None.   Subjective:    Michael Riggs pain is controlled.  Objective:    Vitals:   01/02/22 0915 01/02/22 0930 01/02/22 0945 01/02/22 0955  BP: 93/68 98/73 90/71  99/71  Pulse: (!) 57 (!) 57 (!) 57 (!) 56  Resp: 13 10 14 18   Temp: 98.4 F (36.9 C)  98.4 F (36.9 C) 99.3 F (37.4 C)  TempSrc:      SpO2: 95% 100% 100% 100%  Weight:      Height:       SpO2: 100 % O2 Flow Rate (L/min): 2 L/min   Intake/Output  Summary (Last 24 hours) at 01/02/2022 1036 Last data filed at 01/02/2022 0921 Gross per 24 hour  Intake 690 ml  Output 100 ml  Net 590 ml   Filed Weights   12/29/21 1609 12/31/21 0931 12/31/21 1516  Weight: 103.5 kg 103.1 kg 101.8 kg    Exam: General exam: In no acute distress. Respiratory system: Good air movement and clear to auscultation. Cardiovascular system: S1 & S2 heard, RRR. No JVD. Gastrointestinal system: Abdomen is nondistended, soft and nontender.  Extremities: Wound VAC on the right lower extremity stump Psychiatry: Judgement and insight appear normal. Mood & affect appropriate.   Data Reviewed:    Labs: Basic Metabolic Panel: Recent Labs  Lab 12/27/21 0227 12/28/21 0207 12/29/21 0208 12/31/21 0232 01/02/22 0700  NA 132*  --  128* 130* 129*  K 4.3  --  5.1 4.5 4.5  CL 89*  --  85* 89* 89*  CO2 30  --  24 23 22   GLUCOSE 110*  --  113* 98 136*  BUN 40*  --  81* 71* 71*  CREATININE 5.87*  --  8.77* 7.57* 7.39*  CALCIUM 9.6 9.5 9.4 9.1 9.2  PHOS 5.4*  --  8.9* 7.0*  --     GFR Estimated Creatinine Clearance: 14.5 mL/min (A) (by C-G formula based on SCr of 7.39 mg/dL (H)). Liver Function Tests: Recent Labs  Lab 12/27/21 0227 12/29/21 0208 12/31/21 0232  ALBUMIN 3.0* 2.8* 2.8*    No results for input(s): "LIPASE", "AMYLASE" in the last 168 hours. No results for input(s): "AMMONIA" in the last 168 hours. Coagulation profile No results for input(s): "INR", "PROTIME" in the last 168 hours. COVID-19 Labs  No results for input(s): "DDIMER", "FERRITIN", "LDH", "CRP" in the last 72 hours.  Lab Results  Component Value Date   SARSCOV2NAA NEGATIVE 11/06/2019   [place] NEGATIVE 10/11/2019   [place] NEGATIVE 07/06/2019   [place] NEGATIVE 05/21/2019    CBC: Recent Labs  Lab 12/27/21 0227 12/31/21 0232 01/02/22 0700  WBC 14.5* 14.1* 14.4*  NEUTROABS 10.6* 11.0*  --   HGB 12.5* 12.2* 11.4*  HCT 38.0* 36.8* 34.6*  MCV 106.1* 103.7*  107.1*  PLT 353 269 212    Cardiac Enzymes: No results for input(s): "CKTOTAL", "CKMB", "CKMBINDEX", "TROPONINI" in the last 168 hours. BNP (last 3 results) No results for input(s): "PROBNP" in the last 8760 hours. CBG: Recent Labs  Lab 01/01/22 1633 01/01/22 2047 01/02/22 0517 01/02/22 0736 01/02/22 0920  GLUCAP 71 104* 153* 128* 101*    D-Dimer: No results for input(s): "DDIMER" in the last 72 hours. Hgb A1c: No results for input(s): "HGBA1C" in the last 72 hours. Lipid Profile: No results for input(s): "CHOL", "HDL", "LDLCALC", "TRIG", "CHOLHDL", "LDLDIRECT" in the last 72 hours. Thyroid function studies: No results for input(s): "TSH", "T4TOTAL", "T3FREE", "THYROIDAB" in the last 72 hours.  Invalid input(s): "FREET3" Anemia work up: No results for input(s): "VITAMINB12", "FOLATE", "FERRITIN", "TIBC", "IRON", "RETICCTPCT" in the last  72 hours. Sepsis Labs: Recent Labs  Lab 12/27/21 0227 12/31/21 0232 01/02/22 0700  WBC 14.5* 14.1* 14.4*    Microbiology Recent Results (from the past 240 hour(s))  Blood culture (routine x 2)     Status: None   Collection Time: 12/25/21  7:03 PM   Specimen: BLOOD  Result Value Ref Range Status   Specimen Description BLOOD RIGHT ANTECUBITAL  Final   Special Requests   Final    BOTTLES DRAWN AEROBIC AND ANAEROBIC Blood Culture adequate volume   Culture   Final    NO GROWTH 5 DAYS Performed at [facility], [address], [place], [place] [address]    Report Status 12/30/2021 FINAL  Final  Blood culture (routine x 2)     Status: Abnormal   Collection Time: 12/26/21 12:40 AM   Specimen: BLOOD RIGHT FOREARM  Result Value Ref Range Status   Specimen Description BLOOD RIGHT FOREARM  Final   Special Requests   Final    BOTTLES DRAWN AEROBIC AND ANAEROBIC Blood Culture adequate volume   Culture  Setup Time   Final    GRAM NEGATIVE RODS BOTTLES DRAWN AEROBIC ONLY CRITICAL RESULT CALLED TO, READ BACK BY AND VERIFIED WITH:  PHARMD [place] ON 12/26/21 @ 2027 BY DRT Performed at [facility], [place] [address], [place], [place] [address]    Culture [facility] MORGANII (A)  Final   Report Status 12/28/2021 FINAL  Final   Organism ID, Bacteria MORGANELLA MORGANII  Final      Susceptibility   Morganella morganii - MIC*    AMPICILLIN >=32 RESISTANT Resistant     CEFAZOLIN >=64 RESISTANT Resistant     CEFTAZIDIME 2 SENSITIVE Sensitive     CIPROFLOXACIN <=0.25 SENSITIVE Sensitive     GENTAMICIN <=1 SENSITIVE Sensitive     IMIPENEM 2 SENSITIVE Sensitive     TRIMETH/SULFA <=20 SENSITIVE Sensitive     AMPICILLIN/SULBACTAM >=32 RESISTANT Resistant     PIP/TAZO <=4 SENSITIVE Sensitive     * MORGANELLA MORGANII  Blood Culture ID Panel (Reflexed)     Status: Abnormal   Collection Time: 12/26/21 12:40 AM  Result Value Ref Range Status   Enterococcus faecalis NOT DETECTED NOT DETECTED Final   Enterococcus Faecium NOT DETECTED NOT DETECTED Final   Listeria monocytogenes NOT DETECTED NOT DETECTED Final   Staphylococcus species NOT DETECTED NOT DETECTED Final   Staphylococcus aureus (BCID) NOT DETECTED NOT DETECTED Final   Staphylococcus epidermidis NOT DETECTED NOT DETECTED Final   Staphylococcus lugdunensis NOT DETECTED NOT DETECTED Final   Streptococcus species NOT DETECTED NOT DETECTED Final   Streptococcus agalactiae NOT DETECTED NOT DETECTED Final   Streptococcus pneumoniae NOT DETECTED NOT DETECTED Final   Streptococcus pyogenes NOT DETECTED NOT DETECTED Final   A.calcoaceticus-baumannii NOT DETECTED NOT DETECTED Final   Bacteroides fragilis NOT DETECTED NOT DETECTED Final   Enterobacterales DETECTED (A) NOT DETECTED Final    Comment: Enterobacterales represent a large order of gram negative bacteria, not a single organism. Refer to culture for further identification. CRITICAL RESULT CALLED TO, READ BACK BY AND VERIFIED WITH: PHARMD CARLA JARDIN ON 12/26/21 @ 2027 BY DRT    Enterobacter cloacae complex NOT  DETECTED NOT DETECTED Final   Escherichia coli NOT DETECTED NOT DETECTED Final   Klebsiella aerogenes NOT DETECTED NOT DETECTED Final   Klebsiella oxytoca NOT DETECTED NOT DETECTED Final   Klebsiella pneumoniae NOT DETECTED NOT DETECTED Final   Proteus species NOT DETECTED NOT DETECTED Final  Salmonella species NOT DETECTED NOT DETECTED Final   Serratia marcescens NOT DETECTED NOT DETECTED Final   Haemophilus influenzae NOT DETECTED NOT DETECTED Final   Neisseria meningitidis NOT DETECTED NOT DETECTED Final   Pseudomonas aeruginosa NOT DETECTED NOT DETECTED Final   Stenotrophomonas maltophilia NOT DETECTED NOT DETECTED Final   Candida albicans NOT DETECTED NOT DETECTED Final   Candida auris NOT DETECTED NOT DETECTED Final   Candida glabrata NOT DETECTED NOT DETECTED Final   Candida krusei NOT DETECTED NOT DETECTED Final   Candida parapsilosis NOT DETECTED NOT DETECTED Final   Candida tropicalis NOT DETECTED NOT DETECTED Final   Cryptococcus neoformans/gattii NOT DETECTED NOT DETECTED Final   CTX-M ESBL NOT DETECTED NOT DETECTED Final   Carbapenem resistance IMP NOT DETECTED NOT DETECTED Final   Carbapenem resistance KPC NOT DETECTED NOT DETECTED Final   Carbapenem resistance NDM NOT DETECTED NOT DETECTED Final   Carbapenem resist OXA 48 LIKE NOT DETECTED NOT DETECTED Final   Carbapenem resistance VIM NOT DETECTED NOT DETECTED Final    Comment: Performed at [facility], [address], [place], [place] [address]  Surgical pcr screen     Status: None   Collection Time: 12/30/21  3:11 PM   Specimen: Nasal Mucosa; Nasal Swab  Result Value Ref Range Status   MRSA, PCR NEGATIVE NEGATIVE Final   Staphylococcus aureus NEGATIVE NEGATIVE Final    Comment: (NOTE) The Xpert SA Assay (FDA approved for NASAL specimens in patients 44 years of age and older), is one component of a comprehensive surveillance program. It is not intended to diagnose infection nor to guide or monitor  treatment. Performed at [facility], [place] [address], [place], [place] [address]      Medications:    aspirin EC  81 mg Oral Daily   atorvastatin  40 mg Oral QHS   bromocriptine  2.5 mg Oral QHS   buPROPion  300 mg Oral q AM   calcitRIOL  1 mcg Oral Q M,W,F-HD   chlorhexidine       Chlorhexidine Gluconate Cloth  6 each Topical Q0600   Chlorhexidine Gluconate Cloth  6 each Topical Q0600   Chlorhexidine Gluconate Cloth  6 each Topical Q0600   DULoxetine  20 mg Oral BID   heparin  5,000 Units Subcutaneous Q8H   insulin aspart  0-6 Units Subcutaneous TID WC   melatonin  10 mg Oral QHS   metoprolol tartrate  25 mg Oral BID   midodrine  10 mg Oral Q M,W,F   multivitamin  1 tablet Oral QHS   pantoprazole  40 mg Oral QHS   sevelamer carbonate  2.4 g Oral TID WC   tamsulosin  0.4 mg Oral QHS   urea   Topical BID   Continuous Infusions:  ceFEPime (MAXIPIME) IV 1 g (01/01/22 1215)      LOS: 7 days   Charlynne Cousins  [facility]  01/02/2022, 10:36 AM

## 2022-01-02 NOTE — Anesthesia Postprocedure Evaluation (Signed)
Anesthesia Post Note  Patient: Michael Riggs  Procedure(s) Performed: RIGHT BELOW KNEE AMPUTATION (Right: Knee)     Patient location during evaluation: PACU Anesthesia Type: Regional and MAC Level of consciousness: awake and alert Pain management: pain level controlled Vital Signs Assessment: post-procedure vital signs reviewed and stable Respiratory status: spontaneous breathing, nonlabored ventilation, respiratory function stable and patient connected to nasal cannula oxygen Cardiovascular status: stable and blood pressure returned to baseline Postop Assessment: no apparent nausea or vomiting Anesthetic complications: no   No notable events documented.  Last Vitals:  Vitals:   01/02/22 0945 01/02/22 0955  BP: 90/71 99/71  Pulse: (!) 57 60  Resp: 14 18  Temp: 36.9 C 37.4 C  SpO2: 100% 100%    Last Pain:  Vitals:   01/02/22 1307  TempSrc:   PainSc: 4                  Marice Angelino L Shawnda Mauney

## 2022-01-02 NOTE — Progress Notes (Signed)
Hd tx completed. Pt tolerated well. Avf w/out s/s of complications. Pt transported back to room aox4, no acute distress. Report given to bedside rn.

## 2022-01-03 ENCOUNTER — Encounter (HOSPITAL_COMMUNITY): Payer: Self-pay | Admitting: Orthopedic Surgery

## 2022-01-03 DIAGNOSIS — M86171 Other acute osteomyelitis, right ankle and foot: Secondary | ICD-10-CM | POA: Diagnosis not present

## 2022-01-03 DIAGNOSIS — T148XXA Other injury of unspecified body region, initial encounter: Secondary | ICD-10-CM | POA: Diagnosis not present

## 2022-01-03 DIAGNOSIS — L089 Local infection of the skin and subcutaneous tissue, unspecified: Secondary | ICD-10-CM

## 2022-01-03 DIAGNOSIS — I10 Essential (primary) hypertension: Secondary | ICD-10-CM | POA: Diagnosis not present

## 2022-01-03 DIAGNOSIS — N186 End stage renal disease: Secondary | ICD-10-CM | POA: Diagnosis not present

## 2022-01-03 LAB — GLUCOSE, CAPILLARY
Glucose-Capillary: 128 mg/dL — ABNORMAL HIGH (ref 70–99)
Glucose-Capillary: 116 mg/dL — ABNORMAL HIGH (ref 70–99)
Glucose-Capillary: 197 mg/dL — ABNORMAL HIGH (ref 70–99)

## 2022-01-03 LAB — CBC
HCT: 34.8 % — ABNORMAL LOW (ref 39.0–52.0)
Hemoglobin: 11 g/dL — ABNORMAL LOW (ref 13.0–17.0)
MCH: 33.7 pg (ref 26.0–34.0)
MCHC: 31.6 g/dL (ref 30.0–36.0)
MCV: 106.7 fL — ABNORMAL HIGH (ref 80.0–100.0)
Platelets: 269 10*3/uL (ref 150–400)
RBC: 3.26 MIL/uL — ABNORMAL LOW (ref 4.22–5.81)
RDW: 16.1 % — ABNORMAL HIGH (ref 11.5–15.5)
WBC: 16.3 10*3/uL — ABNORMAL HIGH (ref 4.0–10.5)
nRBC: 0 % (ref 0.0–0.2)

## 2022-01-03 MED ORDER — FERRIC CITRATE 1 GM 210 MG(FE) PO TABS
630.0000 mg | ORAL_TABLET | Freq: Three times a day (TID) | ORAL | Status: DC
  Administered 2022-01-03 – 2022-01-06 (×10): 630 mg via ORAL
  Filled 2022-01-03 (×13): qty 3

## 2022-01-03 MED ORDER — INSULIN ASPART 100 UNIT/ML IJ SOLN
2.0000 [IU] | Freq: Three times a day (TID) | INTRAMUSCULAR | Status: DC
  Administered 2022-01-03 – 2022-01-06 (×9): 2 [IU] via SUBCUTANEOUS

## 2022-01-03 NOTE — Progress Notes (Signed)
[name], male   DOB: 02/19/1971, 51 y.o.   MRN: [id number] Patient is postoperative day 1 right transtibial amputation.  The wound VAC is functioning well there is no drainage.  Discharge planning based on therapy recommendations.  Patient may benefit from inpatient or outpatient rehab.  Patient will also need a elevating wheelchair to allow him to get into his bed at home.

## 2022-01-03 NOTE — Progress Notes (Signed)
Pt informed this wirter that he lost his cellphone. Per pt he's bed was changed by PT staff. Checked the entire room including the bed,and cellphone could not  be located. Hamper was checked in the soil room by this writer and no cellphone found.Charge nurse made aware of above.

## 2022-01-03 NOTE — Evaluation (Signed)
Occupational Therapy Evaluation Patient Details Name: Michael Riggs MRN: [id number] DOB: [birth year] Today's Date: 01/03/2022   History of Present Illness 51 y.o. 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   Clinical Impression   Patient admitted for the diagnosis above.  Subsequent BKA to RLE.  PTA lives with his brother and SIL.  Brother assists as needed, but he tries to be as independent as possible.  He has a L prosthetic, but can only wear it briefly for transfers due to wounds on the leg.  OT will follow, and AIR is recommended for post acute rehab to maximize functional status prior to returning home.        Recommendations for follow up therapy are one component of a multi-disciplinary discharge planning process, led by the attending physician.  Recommendations may be updated based on patient status, additional functional criteria and insurance authorization.   Follow Up Recommendations  Acute inpatient rehab (3hours/day)    Assistance Recommended at Discharge Frequent or constant Supervision/Assistance  Patient can return home with the following A lot of help with walking and/or transfers;A little help with bathing/dressing/bathroom;Help with stairs or ramp for entrance;Assistance with cooking/housework;Assist for transportation    Functional Status Assessment  Patient has had a recent decline in their functional status and demonstrates the ability to make significant improvements in function in a reasonable and predictable amount of time.  Equipment Recommendations  Other (comment) (Drop arm bedside commode.  Power wheelchair assessment.)    Recommendations for Other Services Rehab consult     Precautions / Restrictions Precautions Precautions: Fall;Other (comment) Precaution  Comments: multiple areas of tissue breakdown to B legs, elbows. Restrictions Weight Bearing Restrictions: Yes RLE Weight Bearing: Non weight bearing LLE Weight Bearing: Non weight bearing Other Position/Activity Restrictions: Has a L prosthetic, wears for brief periods/transfers only.      Mobility Bed Mobility Overal bed mobility: Needs Assistance Bed Mobility: Supine to Sit     Supine to sit: Supervision, HOB elevated     General bed mobility comments: brings bed to 45 degrees to pivot on his bottom.    Transfers                          Balance Overall balance assessment: Needs assistance Sitting-balance support: Bilateral upper extremity supported Sitting balance-Leahy Scale: Fair                                     ADL either performed or assessed with clinical judgement   ADL Overall ADL's : Needs assistance/impaired Eating/Feeding: Independent;Bed level   Grooming: Wash/dry hands;Wash/dry face;Set up;Bed level   Upper Body Bathing: Set up;Bed level   Lower Body Bathing: Minimal assistance;Bed level   Upper Body Dressing : Set up;Bed level   Lower Body Dressing: Moderate assistance;Bed level     Toilet Transfer Details (indicate cue type and reason): No drop arm in room.  Able to perform lateral scoot with a lot of momentum and Min Guard.                 Vision Baseline Vision/History: 1 Wears glasses Patient Visual Report: No change from baseline       Perception Perception Perception: Not tested  Praxis Praxis Praxis: Not tested    Pertinent Vitals/Pain Pain Assessment Pain Assessment: Faces Faces Pain Scale: Hurts whole lot Pain Location: R leg Pain Descriptors / Indicators: Guarding, Grimacing, Sharp, Operative site guarding Pain Intervention(s): Monitored during session, RN gave pain meds during session     Hand Dominance Right   Extremity/Trunk Assessment Upper Extremity Assessment Upper Extremity  Assessment: Generalized weakness   Lower Extremity Assessment Lower Extremity Assessment: Defer to PT evaluation   Cervical / Trunk Assessment Cervical / Trunk Assessment: Other exceptions Cervical / Trunk Exceptions: body habitus   Communication Communication Communication: No difficulties   Cognition Arousal/Alertness: Awake/alert Behavior During Therapy: WFL for tasks assessed/performed Overall Cognitive Status: Within Functional Limits for tasks assessed                                       General Comments   VSS on RA    Exercises     Shoulder Instructions      Home Living Family/patient expects to be discharged to:: Private residence Living Arrangements: Other relatives Available Help at Discharge: Family;Available PRN/intermittently Type of Home: Apartment Home Access: Level entry     Home Layout: One level     Bathroom Shower/Tub: Teacher, early years/pre: Standard Bathroom Accessibility: Yes   Home Equipment: Hand held shower head;Tub bench;Wheelchair - manual;Cane - single point;Hospital bed   Additional Comments: brother also lives with pt and provides assist as needed.      Prior Functioning/Environment               Mobility Comments: uses w/c in apartment to go to bathroom, prosthetic and cane to go short distance. ADLs Comments: Brother mainly cooks but he can if he wants to. Does not work, brother assists with community mobility.  Showers.        OT Problem List: Decreased strength;Decreased activity tolerance;Impaired balance (sitting and/or standing);Obesity;Pain      OT Treatment/Interventions: Self-care/ADL training;Therapeutic exercise;Therapeutic activities;Balance training;Patient/family education    OT Goals(Current goals can be found in the care plan section) Acute Rehab OT Goals Patient Stated Goal: I have to learn how to move. OT Goal Formulation: With patient Time For Goal Achievement:  01/16/22 Potential to Achieve Goals: Good ADL Goals Pt Will Perform Lower Body Bathing: with set-up;sitting/lateral leans Pt Will Perform Lower Body Dressing: with set-up;sitting/lateral leans Pt Will Transfer to Toilet: anterior/posterior transfer;with transfer board;bedside commode Pt/caregiver will Perform Home Exercise Program: Increased strength;Both right and left upper extremity;With theraband;With written HEP provided  OT Frequency: Min 2X/week    Co-evaluation              AM-PAC OT "6 Clicks" Daily Activity     Outcome Measure Help from another person eating meals?: None Help from another person taking care of personal grooming?: None Help from another person toileting, which includes using toliet, bedpan, or urinal?: A Lot Help from another person bathing (including washing, rinsing, drying)?: A Lot Help from another person to put on and taking off regular upper body clothing?: A Little Help from another person to put on and taking off regular lower body clothing?: A Lot 6 Click Score: 17   End of Session Nurse Communication: Mobility status  Activity Tolerance: Patient tolerated treatment well Patient left: in bed;with call bell/phone within reach;with nursing/sitter in room  OT Visit Diagnosis: Muscle weakness (generalized) (M62.81);Pain Pain - Right/Left: Right Pain -  part of body: Leg                Time: 0930-0950 OT Time Calculation (min): 20 min Charges:  OT General Charges $OT Visit: 1 Visit OT Evaluation $OT Eval Moderate Complexity: 1 Mod  01/03/2022  RP, OTR/L  Acute Rehabilitation Services  Office:  [phone]   Metta Clines 01/03/2022, 10:05 AM

## 2022-01-03 NOTE — Evaluation (Signed)
Physical Therapy Evaluation Patient Details Name: Michael Riggs MRN: [id number] DOB: 11-30-1970 Today's Date: 01/03/2022  History of Present Illness  51 y.o. 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  Clinical Impression  Prior to admission, pt was independent with transfers using L prosthetic and independent with WC mobility. Pt currently requiring modA +2 for lateral slide board transfers and AP transfers. Pt able to sit in [facility] at sink to perform self care tasks. Transfer progression limited this session due to prosthetic not being present. Pt would benefit from acute PT to progress independence with transfers and address deficits in balance and functional mobility. Recommending AIR upon discharge due to pts current assist levels required and potential to return to independence.    Recommendations for follow up therapy are one component of a multi-disciplinary discharge planning process, led by the attending physician.  Recommendations may be updated based on patient status, additional functional criteria and insurance authorization.  Follow Up Recommendations Acute inpatient rehab (3hours/day)    Assistance Recommended at Discharge Intermittent Supervision/Assistance  Patient can return home with the following  A lot of help with walking and/or transfers;A little help with bathing/dressing/bathroom;Assistance with cooking/housework;Assist for transportation;Help with stairs or ramp for entrance    Equipment Recommendations None recommended by PT  Recommendations for Other Services  Rehab consult    Functional Status Assessment Patient has had a recent decline in their functional status and demonstrates the ability to make significant improvements in function in a reasonable and  predictable amount of time.     Precautions / Restrictions Precautions Precautions: Fall;Other (comment) Precaution Comments: multiple areas of tissue breakdown to B legs, elbows. Wound vac Required Braces or Orthoses: Splint/Cast Splint/Cast: limb protector when OOB Restrictions Weight Bearing Restrictions: Yes RLE Weight Bearing: Non weight bearing LLE Weight Bearing: Non weight bearing Other Position/Activity Restrictions: Has a L prosthetic, wears for brief periods/transfers only.      Mobility  Bed Mobility Overal bed mobility: Needs Assistance Bed Mobility: Supine to Sit     Supine to sit: Supervision, HOB elevated     General bed mobility comments: brings bed to 45 degrees to pivot on his bottom.    Transfers Overall transfer level: Needs assistance Equipment used: Sliding board Transfers: Bed to chair/wheelchair/BSC            Lateral/Scoot Transfers: Mod assist, +2 physical assistance, +2 safety/equipment (3rd person to steady WC)      Ambulation/Gait                  Hotel manager mobility: Yes Wheelchair propulsion: Left upper extremity Wheelchair parts: Supervision/cueing Distance: 100ft Wheelchair Assistance Details (indicate cue type and reason): to and from bathroom sink to perform self care, cueing required to lock brakes  Modified Rankin (Stroke Patients Only)       Balance Overall balance assessment: Needs assistance Sitting-balance support: Bilateral upper extremity supported Sitting balance-Leahy Scale: Fair Sitting balance - Comments: LOB when donning gait belt Postural control: Posterior lean                                   Pertinent Vitals/Pain Pain Assessment Pain Assessment: 0-10  Pain Score: 9  Pain Location: R leg Pain Descriptors / Indicators: Guarding, Grimacing, Sharp, Operative site guarding Pain Intervention(s): RN gave pain meds  during session    [place] expects to be discharged to:: Private residence Living Arrangements: Other relatives Available Help at Discharge: Family;Available PRN/intermittently Type of Home: Apartment Home Access: Level entry       Home Layout: One level Home Equipment: Hand held shower head;Tub bench;Wheelchair - manual;Cane - single point;Hospital bed Additional Comments: brother also lives with pt and provides assist as needed.    Prior Function Prior Level of Function : Independent/Modified Independent             Mobility Comments: uses w/c in apartment to go to bathroom, prosthetic and cane to go short distance. ADLs Comments: Brother mainly cooks but he can if he wants to. Does not work, brother assists with community mobility.  Showers.     Hand Dominance   Dominant Hand: Right    Extremity/Trunk Assessment   Upper Extremity Assessment Upper Extremity Assessment: Overall WFL for tasks assessed    Lower Extremity Assessment Lower Extremity Assessment: Overall WFL for tasks assessed    Cervical / Trunk Assessment Cervical / Trunk Assessment: Other exceptions Cervical / Trunk Exceptions: body habitus  Communication   Communication: No difficulties  Cognition Arousal/Alertness: Awake/alert Behavior During Therapy: WFL for tasks assessed/performed Overall Cognitive Status: Within Functional Limits for tasks assessed                                          General Comments      Exercises     Assessment/Plan    PT Assessment Patient needs continued PT services  PT Problem List Decreased strength;Decreased activity tolerance;Decreased balance;Decreased mobility;Decreased knowledge of use of DME;Decreased safety awareness;Pain       PT Treatment Interventions DME instruction;Functional mobility training;Therapeutic activities;Therapeutic exercise;Balance training;Neuromuscular re-education;Wheelchair mobility  training;Manual techniques;Patient/family education    PT Goals (Current goals can be found in the Care Plan section)  Acute Rehab PT Goals Patient Stated Goal: to transfer independently PT Goal Formulation: With patient Time For Goal Achievement: 02/01/2022 Potential to Achieve Goals: Good    Frequency Min 3X/week     Co-evaluation               AM-PAC PT "6 Clicks" Mobility  Outcome Measure Help needed turning from your back to your side while in a flat bed without using bedrails?: A Little Help needed moving from lying on your back to sitting on the side of a flat bed without using bedrails?: A Little Help needed moving to and from a bed to a chair (including a wheelchair)?: A Lot Help needed standing up from a chair using your arms (e.g., wheelchair or bedside chair)?: Total Help needed to walk in hospital room?: Total Help needed climbing 3-5 steps with a railing? : Total 6 Click Score: 11    End of Session Equipment Utilized During Treatment: Gait belt (limb protector) Activity Tolerance: Patient tolerated treatment well Patient left: in bed;with call bell/phone within reach Nurse Communication: Mobility status PT Visit Diagnosis: Other abnormalities of gait and mobility (R26.89);Muscle weakness (generalized) (M62.81);Pain Pain - Right/Left: Right Pain - part of body: Leg    Time: 3329-5188 PT Time Calculation (min) (ACUTE ONLY): 47 min   Charges:     PT Treatments $Therapeutic Activity: 38-52 mins  Mackie Pai, SPT Acute Rehabilitation Services  Office: [phone]  Mackie Pai 01/03/2022, 1:43 PM

## 2022-01-03 NOTE — Progress Notes (Addendum)
[facility] PROGRESS NOTE    Progress Note  Michael Riggs  OZD:[id number] DOB: 06-01-1971 DOA: 12/25/2021 PCP: Michael Nakai, MD     Brief Narrative:   Michael Riggs is an 51 y.o. 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 Sharol Riggs as an outpatient and told to to the office on 12/26/2031 Return to the emergency room found to have a breakdown of his calcaneus posteriorly found to have osteomyelitis in the right calcaneus area was started on antibiotics and orthopedic surgery was consulted.   Assessment/Plan:   Right leg gangrenous changes of right calcaneus and Achilles tendon osteomyelitis of foot, right, acute [facility]): He had a history of prior angioplasty to the distal SFA with a DES placement 10/06/2021. Was started on IV cefepime Plavix held, continue on aspirin and statins. Limb salvage intervention failed, status post right below the knee amputation on 01/02/2022. Occupational Therapy evaluated the patient, will need inpatient rehab. Orthopedic surgery to dictate when we can start back Plavix Awaiting PT evaluation  Right lower extremity bleeding: Management per Ortho.  End-stage renal disease: Continue further dialysis per renal, Monday Wednesday and Friday. EGD on 01/05/2022  Transient hypotension: Likely due to missed dose of midodrine. We will continue midodrine Monday Wednesday Friday prior to HD.  Diabetes mellitus type 2: Hold on no oral hypoglycemic agents. Continue sliding scale insulin blood glucose fairly controlled. At low-dose NovoLog 2 units with meals.  Essential hypertension: Continue metoprolol twice a day discontinue amlodipine.  Prolactinemia: Continue primidone at bedtime  follow-up with endocrinologist as an outpatient.  Diabetic peripheral neuropathy: Continue Cymbalta and Wellbutrin.  BPH: Continue Flomax.  Obstructive sleep apnea: noted  DVT prophylaxis: lovenox Family Communication:none Status is: Inpatient Remains inpatient appropriate because: Osteomyelitis and gangrenous right foot    Code Status:     Code Status Orders  (From admission, onward)           Start     Ordered   12/26/21 0337  Full code  Continuous        12/26/21 0339           Code Status History     Date Active Date Inactive Code Status Order ID Comments User Context   10/06/2021 1611 10/16/2021 2203 Full Code [id number]  Michael Guiles, DO ED   11/06/2019 0601 11/10/2019 2303 Full Code [id number]  Michael Patience, MD ED   10/11/2019 0645 10/12/2019 2155 Full Code [id number]  Michael Patience, MD Inpatient   05/14/2019 2206 05/24/2019 1435 Full Code [id number]  Michael Edinger, MD ED   03/28/2019 1007 04/27/2019 2150 Full Code [id number]  Michael Bongo, MD Inpatient   02/09/2019 0003 03/14/2019 1528 Full Code [id number]  Michael Parsons, PA-C Inpatient   02/09/2019 0003 02/09/2019 0003 Full Code [id number]  Michael Parsons, PA-C Inpatient   02/04/2019 0546 02/08/2019 2306 Full Code [id number]  Michael Patience, MD Inpatient   01/30/2019 2359 02/03/2019 1539 Full Code [id number]  Michael Patience, MD ED         IV Access:   Peripheral IV   Procedures and diagnostic studies:   No results found.   Medical Consultants:   None.   Subjective:    Michael Riggs pain is controlled wanting to go to inpatient  rehab.  Objective:    Vitals:   01/02/22 2054 01/02/22 2155 01/03/22 0510 01/03/22 0753  BP: (!) 142/88 126/89 (!) 137/109 124/81  Pulse: 81 85 76 88  Resp: 17  14 18   Temp: 98.5 F (36.9 C) 99 F (37.2 C) 97.6 F (36.4 C) 98.3 F (36.8 C)  TempSrc: Oral  Oral   SpO2: 100% 100% 100% 100%  Weight: 101.7 kg     Height:        SpO2: 100 % O2 Flow Rate (L/min): 3 L/min   Intake/Output Summary (Last 24 hours) at 01/03/2022 1023 Last data filed at 01/03/2022 0900 Gross per 24 hour  Intake 360 ml  Output 2500 ml  Net -2140 ml    Filed Weights   12/31/21 1516 01/02/22 1622 01/02/22 2054  Weight: 101.8 kg 102.9 kg 101.7 kg    Exam: General exam: In no acute distress. Respiratory system: Good air movement and clear to auscultation. Cardiovascular system: S1 & S2 heard, RRR. No JVD. Gastrointestinal system: Abdomen is nondistended, soft and nontender.  Extremities: No pedal edema. Skin: No rashes, lesions or ulcers Psychiatry: Judgement and insight appear normal. Mood & affect appropriate. Data Reviewed:    Labs: Basic Metabolic Panel: Recent Labs  Lab 12/28/21 0207 12/29/21 0208 12/29/21 0208 12/31/21 0232 01/02/22 0700  NA  --  128*  --  130* 129*  K  --  5.1   < > 4.5 4.5  CL  --  85*  --  89* 89*  CO2  --  24  --  23 22  GLUCOSE  --  113*  --  98 136*  BUN  --  81*  --  71* 71*  CREATININE  --  8.77*  --  7.57* 7.39*  CALCIUM 9.5 9.4  --  9.1 9.2  PHOS  --  8.9*  --  7.0*  --    < > = values in this interval not displayed.    GFR Estimated Creatinine Clearance: 14.5 mL/min (A) (by C-G formula based on SCr of 7.39 mg/dL (H)). Liver Function Tests: Recent Labs  Lab 12/29/21 0208 12/31/21 0232  ALBUMIN 2.8* 2.8*    No results for input(s): "LIPASE", "AMYLASE" in the last 168 hours. No results for input(s): "AMMONIA" in the last 168 hours. Coagulation profile No results for input(s): "INR", "PROTIME" in the last 168 hours. COVID-19 Labs  No results for input(s): "DDIMER", "FERRITIN", "LDH", "CRP" in the last 72 hours.  Lab Results  Component Value Date   SARSCOV2NAA NEGATIVE 11/06/2019   [place] NEGATIVE 10/11/2019   [place] NEGATIVE 07/06/2019   [place] NEGATIVE 05/21/2019    CBC: Recent Labs  Lab 12/31/21 0232 01/02/22 0700 01/03/22 0218  WBC 14.1*  14.4* 16.3*  NEUTROABS 11.0*  --   --   HGB 12.2* 11.4* 11.0*  HCT 36.8* 34.6* 34.8*  MCV 103.7* 107.1* 106.7*  PLT 269 212 269    Cardiac Enzymes: No results for input(s): "CKTOTAL", "CKMB", "CKMBINDEX", "TROPONINI" in the last 168 hours. BNP (last 3 results) No results for input(s): "PROBNP" in the last 8760 hours. CBG: Recent Labs  Lab 01/02/22 0920 01/02/22 0954 01/02/22 1147 01/02/22 2158 01/03/22 0748  GLUCAP 101* 111* 137* 180* 128*    D-Dimer: No results for input(s): "DDIMER" in the last 72 hours. Hgb A1c: No results for input(s): "HGBA1C" in the last 72 hours. Lipid Profile: No results for input(s): "CHOL", "HDL", "LDLCALC", "TRIG", "CHOLHDL", "LDLDIRECT" in the last 72 hours. Thyroid function studies:  No results for input(s): "TSH", "T4TOTAL", "T3FREE", "THYROIDAB" in the last 72 hours.  Invalid input(s): "FREET3" Anemia work up: No results for input(s): "VITAMINB12", "FOLATE", "FERRITIN", "TIBC", "IRON", "RETICCTPCT" in the last 72 hours. Sepsis Labs: Recent Labs  Lab 12/31/21 0232 01/02/22 0700 01/03/22 0218  WBC 14.1* 14.4* 16.3*    Microbiology Recent Results (from the past 240 hour(s))  Blood culture (routine x 2)     Status: None   Collection Time: 12/25/21  7:03 PM   Specimen: BLOOD  Result Value Ref Range Status   Specimen Description BLOOD RIGHT ANTECUBITAL  Final   Special Requests   Final    BOTTLES DRAWN AEROBIC AND ANAEROBIC Blood Culture adequate volume   Culture   Final    NO GROWTH 5 DAYS Performed at [facility], [address] [place], [place] [address]    Report Status 12/30/2021 FINAL  Final  Blood culture (routine x 2)     Status: Abnormal   Collection Time: 12/26/21 12:40 AM   Specimen: BLOOD RIGHT FOREARM  Result Value Ref Range Status   Specimen Description BLOOD RIGHT FOREARM  Final   Special Requests   Final    BOTTLES DRAWN AEROBIC AND ANAEROBIC Blood Culture adequate volume   Culture  Setup Time   Final     GRAM NEGATIVE RODS BOTTLES DRAWN AEROBIC ONLY CRITICAL RESULT CALLED TO, READ BACK BY AND VERIFIED WITH: PHARMD Burna ON 12/26/21 @ 2027 BY DRT Performed at [facility], [place] [address], [place], [place] [address]    Culture [facility] MORGANII (A)  Final   Report Status 12/28/2021 FINAL  Final   Organism ID, Bacteria MORGANELLA MORGANII  Final      Susceptibility   Morganella morganii - MIC*    AMPICILLIN >=32 RESISTANT Resistant     CEFAZOLIN >=64 RESISTANT Resistant     CEFTAZIDIME 2 SENSITIVE Sensitive     CIPROFLOXACIN <=0.25 SENSITIVE Sensitive     GENTAMICIN <=1 SENSITIVE Sensitive     IMIPENEM 2 SENSITIVE Sensitive     TRIMETH/SULFA <=20 SENSITIVE Sensitive     AMPICILLIN/SULBACTAM >=32 RESISTANT Resistant     PIP/TAZO <=4 SENSITIVE Sensitive     * MORGANELLA MORGANII  Blood Culture ID Panel (Reflexed)     Status: Abnormal   Collection Time: 12/26/21 12:40 AM  Result Value Ref Range Status   Enterococcus faecalis NOT DETECTED NOT DETECTED Final   Enterococcus Faecium NOT DETECTED NOT DETECTED Final   Listeria monocytogenes NOT DETECTED NOT DETECTED Final   Staphylococcus species NOT DETECTED NOT DETECTED Final   Staphylococcus aureus (BCID) NOT DETECTED NOT DETECTED Final   Staphylococcus epidermidis NOT DETECTED NOT DETECTED Final   Staphylococcus lugdunensis NOT DETECTED NOT DETECTED Final   Streptococcus species NOT DETECTED NOT DETECTED Final   Streptococcus agalactiae NOT DETECTED NOT DETECTED Final   Streptococcus pneumoniae NOT DETECTED NOT DETECTED Final   Streptococcus pyogenes NOT DETECTED NOT DETECTED Final   A.calcoaceticus-baumannii NOT DETECTED NOT DETECTED Final   Bacteroides fragilis NOT DETECTED NOT DETECTED Final   Enterobacterales DETECTED (A) NOT DETECTED Final    Comment: Enterobacterales represent a large order of gram negative bacteria, not a single organism. Refer to culture for further identification. CRITICAL RESULT CALLED TO, READ  BACK BY AND VERIFIED WITH: PHARMD CARLA [place] ON 12/26/21 @ 2027 BY DRT    Enterobacter cloacae complex NOT DETECTED NOT DETECTED Final   Escherichia coli NOT DETECTED NOT DETECTED Final   Klebsiella aerogenes  NOT DETECTED NOT DETECTED Final   Klebsiella oxytoca NOT DETECTED NOT DETECTED Final   Klebsiella pneumoniae NOT DETECTED NOT DETECTED Final   Proteus species NOT DETECTED NOT DETECTED Final   Salmonella species NOT DETECTED NOT DETECTED Final   Serratia marcescens NOT DETECTED NOT DETECTED Final   Haemophilus influenzae NOT DETECTED NOT DETECTED Final   Neisseria meningitidis NOT DETECTED NOT DETECTED Final   Pseudomonas aeruginosa NOT DETECTED NOT DETECTED Final   Stenotrophomonas maltophilia NOT DETECTED NOT DETECTED Final   Candida albicans NOT DETECTED NOT DETECTED Final   Candida auris NOT DETECTED NOT DETECTED Final   Candida glabrata NOT DETECTED NOT DETECTED Final   Candida krusei NOT DETECTED NOT DETECTED Final   Candida parapsilosis NOT DETECTED NOT DETECTED Final   Candida tropicalis NOT DETECTED NOT DETECTED Final   Cryptococcus neoformans/gattii NOT DETECTED NOT DETECTED Final   CTX-M ESBL NOT DETECTED NOT DETECTED Final   Carbapenem resistance IMP NOT DETECTED NOT DETECTED Final   Carbapenem resistance KPC NOT DETECTED NOT DETECTED Final   Carbapenem resistance NDM NOT DETECTED NOT DETECTED Final   Carbapenem resist OXA 48 LIKE NOT DETECTED NOT DETECTED Final   Carbapenem resistance VIM NOT DETECTED NOT DETECTED Final    Comment: Performed at [facility], [address] [place], [place] [address]  Surgical pcr screen     Status: None   Collection Time: 12/30/21  3:11 PM   Specimen: Nasal Mucosa; Nasal Swab  Result Value Ref Range Status   MRSA, PCR NEGATIVE NEGATIVE Final   Staphylococcus aureus NEGATIVE NEGATIVE Final    Comment: (NOTE) The Xpert SA Assay (FDA approved for NASAL specimens in patients 59 years of age and older), is one component of a  comprehensive surveillance program. It is not intended to diagnose infection nor to guide or monitor treatment. Performed at [facility], [place] [address], [place], [place] [address]      Medications:    vitamin C  1,000 mg Oral Daily   aspirin EC  81 mg Oral Daily   atorvastatin  40 mg Oral QHS   bromocriptine  2.5 mg Oral QHS   buPROPion  300 mg Oral q AM   calcitRIOL  1 mcg Oral Q M,W,F-HD   Chlorhexidine Gluconate Cloth  6 each Topical Q0600   Chlorhexidine Gluconate Cloth  6 each Topical Q0600   Chlorhexidine Gluconate Cloth  6 each Topical Q0600   docusate sodium  100 mg Oral Daily   DULoxetine  20 mg Oral BID   heparin  5,000 Units Subcutaneous Q8H   insulin aspart  0-6 Units Subcutaneous TID WC   melatonin  10 mg Oral QHS   metoprolol tartrate  25 mg Oral BID   midodrine  10 mg Oral Q M,W,F   multivitamin  1 tablet Oral QHS   nutrition supplement (JUVEN)  1 packet Oral BID BM   pantoprazole  40 mg Oral Daily   sevelamer carbonate  2.4 g Oral TID WC   tamsulosin  0.4 mg Oral QHS   urea   Topical BID   zinc sulfate  220 mg Oral Daily   Continuous Infusions:  sodium chloride 10 mL/hr at 01/02/22 1811   ceFEPime (MAXIPIME) IV 1 g (01/03/22 0949)   magnesium sulfate bolus IVPB        LOS: 8 days   Charlynne Cousins  [facility]  01/03/2022, 10:23 AM

## 2022-01-03 NOTE — Plan of Care (Signed)

## 2022-01-03 NOTE — Progress Notes (Signed)
[facility] Progress Note   Subjective:  Seen in room - HD went fine yesterday, net 2.5L UF. No dyspnea or CP, + stump pain. Per notes, being considered for inpatient rehab.  Objective Vitals:   01/02/22 2054 01/02/22 2155 01/03/22 0510 01/03/22 0753  BP: (!) 142/88 126/89 (!) 137/109 124/81  Pulse: 81 85 76 88  Resp: 17  14 18   Temp: 98.5 F (36.9 C) 99 F (37.2 C) 97.6 F (36.4 C) 98.3 F (36.8 C)  TempSrc: Oral  Oral   SpO2: 100% 100% 100% 100%  Weight: 101.7 kg     Height:       Physical Exam General: Chronically ill appearing man, NAD. Room air Heart: RRR; no murmur Lungs: CTA anteriorly Abdomen: soft, non-tender Extremities: R BKA with wound vac in place, L BKA with scattered dark eschar wounds Dialysis Access: LUE AVF + bruit  Additional Objective Labs: Basic Metabolic Panel: Recent Labs  Lab 12/29/21 0208 12/31/21 0232 01/02/22 0700  NA 128* 130* 129*  K 5.1 4.5 4.5  CL 85* 89* 89*  CO2 24 23 22   GLUCOSE 113* 98 136*  BUN 81* 71* 71*  CREATININE 8.77* 7.57* 7.39*  CALCIUM 9.4 9.1 9.2  PHOS 8.9* 7.0*  --    Liver Function Tests: Recent Labs  Lab 12/29/21 0208 12/31/21 0232  ALBUMIN 2.8* 2.8*   CBC: Recent Labs  Lab 12/31/21 0232 01/02/22 0700 01/03/22 0218  WBC 14.1* 14.4* 16.3*  NEUTROABS 11.0*  --   --   HGB 12.2* 11.4* 11.0*  HCT 36.8* 34.6* 34.8*  MCV 103.7* 107.1* 106.7*  PLT 269 212 269   Blood Culture    Component Value Date/Time   SDES BLOOD RIGHT FOREARM 12/26/2021 0040   SPECREQUEST  12/26/2021 0040    BOTTLES DRAWN AEROBIC AND ANAEROBIC Blood Culture adequate volume   CULT MORGANELLA MORGANII (A) 12/26/2021 0040   REPTSTATUS 12/28/2021 FINAL 12/26/2021 0040   Medications:  sodium chloride 10 mL/hr at 01/02/22 1811   ceFEPime (MAXIPIME) IV 1 g (01/03/22 0949)   magnesium sulfate bolus IVPB      vitamin C  1,000 mg Oral Daily   aspirin EC  81 mg Oral Daily   atorvastatin  40 mg Oral QHS    bromocriptine  2.5 mg Oral QHS   buPROPion  300 mg Oral q AM   calcitRIOL  1 mcg Oral Q M,W,F-HD   Chlorhexidine Gluconate Cloth  6 each Topical Q0600   Chlorhexidine Gluconate Cloth  6 each Topical Q0600   Chlorhexidine Gluconate Cloth  6 each Topical Q0600   docusate sodium  100 mg Oral Daily   DULoxetine  20 mg Oral BID   ferric citrate  630 mg Oral TID WC   heparin  5,000 Units Subcutaneous Q8H   insulin aspart  0-6 Units Subcutaneous TID WC   insulin aspart  2 Units Subcutaneous TID WC   melatonin  10 mg Oral QHS   metoprolol tartrate  25 mg Oral BID   midodrine  10 mg Oral Q M,W,F   multivitamin  1 tablet Oral QHS   nutrition supplement (JUVEN)  1 packet Oral BID BM   pantoprazole  40 mg Oral Daily   tamsulosin  0.4 mg Oral QHS   urea   Topical BID   zinc sulfate  220 mg Oral Daily    Dialysis Orders: [place] on MWF 180NRe 5 hours BFR 400 DFR 500 EDW 98.4kg 2K 2Ca AVF 15g - No heparin/ESA -  Calcitriol 1 mcg PO q HD   Assessment/Plan: Chronic RLE wound/osteomyelitis: Failed limb salvage attempts, 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 Wound vac in place. Per ortho. On Cefazolin + Cefepime. ESRD: Continue HD on usual MWF schedule - next HD on Monday 6/19. BP/volume: BP has been low, amlodipine d/c'd. Remains on metop BID with holding parameters. Using midodrine pre-HD. EDW will need to be adjusted s/p amputation. Anemia of ESRD: Hgb 11, watch for post-op drop over the next few days. No ESA for now. Secondary Hyperparathyroidism: CorrCa high sided, calcitriol reduced 53mcg -> 68mcg q HD. Phos high, ordered for Renvela pwdr 2.4g TIW with meals -> discussed with patient today, on Auryxia as OP, changed order. ?Scattered dark wounds with eschar, esp on L stump -  low threshold to resume NaThiosulfate. Nutrition: Alb low, continue supplements. T2DM  Veneta Penton, Hershal Coria 01/03/2022, 11:02 AM  [employer]

## 2022-01-03 NOTE — Progress Notes (Signed)
Inpatient Rehab Admissions:  Inpatient Rehab Consult received.  I met with patient at the bedside for rehabilitation assessment and to discuss goals and expectations of an inpatient rehab admission.  Pt acknowledged understanding of CIR goals and expectations. Pt interested in pursuing CIR. Pt gave permission to contact brother Moises to confirm dispo. Called Moises, left a message; awaiting return call. Will continue to follow.  Signed: Gayland Curry, [place], [place] Admissions Coordinator [phone]

## 2022-01-04 DIAGNOSIS — L089 Local infection of the skin and subcutaneous tissue, unspecified: Secondary | ICD-10-CM | POA: Diagnosis not present

## 2022-01-04 DIAGNOSIS — T148XXA Other injury of unspecified body region, initial encounter: Secondary | ICD-10-CM | POA: Diagnosis not present

## 2022-01-04 LAB — GLUCOSE, CAPILLARY
Glucose-Capillary: 132 mg/dL — ABNORMAL HIGH (ref 70–99)
Glucose-Capillary: 154 mg/dL — ABNORMAL HIGH (ref 70–99)
Glucose-Capillary: 179 mg/dL — ABNORMAL HIGH (ref 70–99)
Glucose-Capillary: 278 mg/dL — ABNORMAL HIGH (ref 70–99)
Glucose-Capillary: 138 mg/dL — ABNORMAL HIGH (ref 70–99)
Glucose-Capillary: 177 mg/dL — ABNORMAL HIGH (ref 70–99)

## 2022-01-04 LAB — CBC
HCT: 32.8 % — ABNORMAL LOW (ref 39.0–52.0)
Hemoglobin: 10.8 g/dL — ABNORMAL LOW (ref 13.0–17.0)
MCH: 34.8 pg — ABNORMAL HIGH (ref 26.0–34.0)
MCHC: 32.9 g/dL (ref 30.0–36.0)
MCV: 105.8 fL — ABNORMAL HIGH (ref 80.0–100.0)
Platelets: 257 10*3/uL (ref 150–400)
RBC: 3.1 MIL/uL — ABNORMAL LOW (ref 4.22–5.81)
RDW: 15.9 % — ABNORMAL HIGH (ref 11.5–15.5)
WBC: 18.4 10*3/uL — ABNORMAL HIGH (ref 4.0–10.5)
nRBC: 0.1 % (ref 0.0–0.2)

## 2022-01-04 MED ORDER — CLOPIDOGREL BISULFATE 75 MG PO TABS
75.0000 mg | ORAL_TABLET | Freq: Every day | ORAL | Status: DC
  Administered 2022-01-04 – 2022-01-06 (×3): 75 mg via ORAL
  Filled 2022-01-04 (×3): qty 1

## 2022-01-04 MED ORDER — INSULIN DETEMIR 100 UNIT/ML ~~LOC~~ SOLN: 5.0000 [IU] | Freq: Two times a day (BID) | SUBCUTANEOUS | Status: DC

## 2022-01-04 MED ORDER — INSULIN DETEMIR 100 UNIT/ML ~~LOC~~ SOLN
5.0000 [IU] | Freq: Every day | SUBCUTANEOUS | Status: DC
  Administered 2022-01-04 – 2022-01-06 (×3): 5 [IU] via SUBCUTANEOUS
  Filled 2022-01-04 (×3): qty 0.05

## 2022-01-04 NOTE — Plan of Care (Signed)
  Problem: Education: Goal: Knowledge of General Education information will improve Description: Including pain rating scale, medication(s)/side effects and non-pharmacologic comfort measures Outcome: Progressing   Problem: Clinical Measurements: Goal: Ability to maintain clinical measurements within normal limits will improve Outcome: Progressing   

## 2022-01-04 NOTE — Progress Notes (Signed)
[facility] Progress Note   Subjective:  Seen in room. Lost his phone somewhere in the hospital yesterday which he is upset about, he has reported it. Called # in his room, no ringing. No CP/dyspnea overnight. For HD tomorrow.  Objective Vitals:   01/03/22 1300 01/03/22 2119 01/04/22 0419 01/04/22 0900  BP: 125/88 106/74 109/69   Pulse: 84 (!) 101 64   Resp: 18 18    Temp: 98.2 F (36.8 C) 98 F (36.7 C) 97.8 F (36.6 C)   TempSrc: Oral Oral    SpO2: 100% 98% 100% 100%  Weight:      Height:       Physical Exam General: Chronically ill appearing man, NAD. Room air Heart: RRR; no murmur Lungs: CTA anteriorly Abdomen: soft, non-tender Extremities: R BKA with wound vac in place, L BKA with scattered dark eschar wounds Dialysis Access: LUE AVF + bruit  Additional Objective Labs: Basic Metabolic Panel: Recent Labs  Lab 12/29/21 0208 12/31/21 0232 01/02/22 0700  NA 128* 130* 129*  K 5.1 4.5 4.5  CL 85* 89* 89*  CO2 24 23 22   GLUCOSE 113* 98 136*  BUN 81* 71* 71*  CREATININE 8.77* 7.57* 7.39*  CALCIUM 9.4 9.1 9.2  PHOS 8.9* 7.0*  --    Liver Function Tests: Recent Labs  Lab 12/29/21 0208 12/31/21 0232  ALBUMIN 2.8* 2.8*   CBC: Recent Labs  Lab 12/31/21 0232 01/02/22 0700 01/03/22 0218 01/04/22 0141  WBC 14.1* 14.4* 16.3* 18.4*  NEUTROABS 11.0*  --   --   --   HGB 12.2* 11.4* 11.0* 10.8*  HCT 36.8* 34.6* 34.8* 32.8*  MCV 103.7* 107.1* 106.7* 105.8*  PLT 269 212 269 257   Blood Culture    Component Value Date/Time   SDES BLOOD RIGHT FOREARM 12/26/2021 0040   SPECREQUEST  12/26/2021 0040    BOTTLES DRAWN AEROBIC AND ANAEROBIC Blood Culture adequate volume   CULT MORGANELLA MORGANII (A) 12/26/2021 0040   REPTSTATUS 12/28/2021 FINAL 12/26/2021 0040   Medications:  sodium chloride 10 mL/hr at 01/02/22 1811   ceFEPime (MAXIPIME) IV 1 g (01/04/22 0949)   magnesium sulfate bolus IVPB      vitamin C  1,000 mg Oral Daily   aspirin EC  81  mg Oral Daily   atorvastatin  40 mg Oral QHS   bromocriptine  2.5 mg Oral QHS   buPROPion  300 mg Oral q AM   calcitRIOL  1 mcg Oral Q M,W,F-HD   Chlorhexidine Gluconate Cloth  6 each Topical Q0600   Chlorhexidine Gluconate Cloth  6 each Topical Q0600   Chlorhexidine Gluconate Cloth  6 each Topical Q0600   docusate sodium  100 mg Oral Daily   DULoxetine  20 mg Oral BID   ferric citrate  630 mg Oral TID WC   heparin  5,000 Units Subcutaneous Q8H   insulin aspart  0-6 Units Subcutaneous TID WC   insulin aspart  2 Units Subcutaneous TID WC   insulin detemir  5 Units Subcutaneous Daily   melatonin  10 mg Oral QHS   metoprolol tartrate  25 mg Oral BID   midodrine  10 mg Oral Q M,W,F   multivitamin  1 tablet Oral QHS   nutrition supplement (JUVEN)  1 packet Oral BID BM   pantoprazole  40 mg Oral Daily   tamsulosin  0.4 mg Oral QHS   urea   Topical BID   zinc sulfate  220 mg Oral Daily  Dialysis Orders: [place] on MWF 180NRe 5 hours BFR 400 DFR 500 EDW 98.4kg 2K 2Ca AVF 15g - No heparin/ESA - Calcitriol 1 mcg PO q HD   Assessment/Plan: Chronic RLE wound/osteomyelitis: Failed limb salvage attempts, 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 Wound vac in place. Per ortho. On Cefazolin + Cefepime. ESRD: Continue HD on usual MWF schedule - next HD on Monday 6/19. BP/volume: BP has been low, amlodipine d/c'd. Remains on metop BID with holding parameters. Using midodrine pre-HD. EDW will need to be adjusted s/p amputation. Anemia of ESRD: Hgb 10.8 - trending down. No ESA for now, start if goes < 10.5. Secondary Hyperparathyroidism: CorrCa high sided, calcitriol reduced 76mcg -> 59mcg q HD. Phos high, changed to usual OP binder Lorin Picket). ?Scattered dark wounds with eschar, esp on L stump -  low threshold to resume NaThiosulfate. Nutrition: Alb low, continue supplements. T2DM  Veneta Penton, Hershal Coria 01/04/2022, 10:47 AM  [employer]

## 2022-01-04 NOTE — Progress Notes (Signed)
[facility] PROGRESS NOTE    Progress Note  Michael Riggs  BWL:[id number] DOB: 1970/11/09 DOA: 12/25/2021 PCP: [name], MD     Brief Narrative:   Michael Riggs is an 51 y.o. 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 Sharol Given as an outpatient and told to to the office on 12/26/2031 Return to the emergency room found to have a breakdown of his calcaneus posteriorly found to have osteomyelitis in the right calcaneus area was started on antibiotics and orthopedic surgery was consulted.   Assessment/Plan:   Right leg gangrenous changes of right calcaneus and Achilles tendon osteomyelitis of foot, right, acute [facility]): He had a history of prior angioplasty to the distal SFA with a DES placement 10/06/2021. Plavix held, continue on aspirin and statins. Limb salvage intervention failed, status post right below the knee amputation on 01/02/2022. Occupational Therapy evaluated the patient, will need inpatient rehab. Orthopedic to dictate when to restart back his Plavix. Awaiting CIR evaluation.  Right lower extremity bleeding: Management per Ortho.  End-stage renal disease: Continue further dialysis per renal, Monday Wednesday and Friday. EGD on 01/05/2022  Transient hypotension: Likely due to missed dose of midodrine. We will continue midodrine Monday Wednesday Friday prior to HD.  Diabetes mellitus type 2: Hold on no oral hypoglycemic agents. Continue sliding scale insulin we will add low-dose Levemir  Essential hypertension: Continue metoprolol twice a day discontinue amlodipine.  Prolactinemia: Continue primidone at bedtime follow-up with endocrinologist as an outpatient.  Diabetic peripheral  neuropathy: Continue Cymbalta and Wellbutrin.  BPH: Continue Flomax.  Obstructive sleep apnea: noted  DVT prophylaxis: lovenox Family Communication:none Status is: Inpatient Remains inpatient appropriate because: Osteomyelitis and gangrenous right foot    Code Status:     Code Status Orders  (From admission, onward)           Start     Ordered   12/26/21 0337  Full code  Continuous        12/26/21 0339           Code Status History     Date Active Date Inactive Code Status Order ID Comments User Context   10/06/2021 1611 10/16/2021 2203 Full Code [id number]  Wells Guiles, DO ED   11/06/2019 0601 11/10/2019 2303 Full Code [id number]  Rise Patience, MD ED   10/11/2019 0645 10/12/2019 2155 Full Code [id number]  Rise Patience, MD Inpatient   05/14/2019 2206 05/24/2019 1435 Full Code [id number]  Gwynne Edinger, MD ED   03/28/2019 1007 04/27/2019 2150 Full Code [id number]  Karmen Bongo, MD Inpatient   02/09/2019 0003 03/14/2019 1528 Full Code [id number]  Cathlyn Parsons, PA-C Inpatient   02/09/2019 0003 02/09/2019 0003 Full Code [id number]  Cathlyn Parsons, PA-C Inpatient   02/04/2019 0546 02/08/2019 2306 Full Code [id number]  Rise Patience, MD Inpatient   01/30/2019 2359 02/03/2019 1539 Full Code [id number]  Rise Patience, MD ED         IV Access:   Peripheral IV   Procedures and diagnostic studies:   No results found.   Medical Consultants:   None.   Subjective:    Michael Riggs pain is controlled tolerating his diet having bowel movements.  Objective:    Vitals:   01/03/22 1300 01/03/22  2119 01/04/22 0419 01/04/22 0900  BP: 125/88 106/74 109/69   Pulse: 84 (!) 101 64   Resp: 18 18    Temp: 98.2 F (36.8 C) 98 F (36.7 C) 97.8 F (36.6 C)   TempSrc: Oral Oral    SpO2: 100% 98% 100% 100%  Weight:      Height:       SpO2: 100 % O2 Flow Rate (L/min): 3 L/min   Intake/Output Summary (Last 24 hours) at  01/04/2022 1039 Last data filed at 01/04/2022 0600 Gross per 24 hour  Intake 867.76 ml  Output 0 ml  Net 867.76 ml    Filed Weights   12/31/21 1516 01/02/22 1622 01/02/22 2054  Weight: 101.8 kg 102.9 kg 101.7 kg    Exam: General exam: In no acute distress. Respiratory system: Good air movement and clear to auscultation. Cardiovascular system: S1 & S2 heard, RRR. No JVD. Gastrointestinal system: Abdomen is nondistended, soft and nontender.  Extremities: No pedal edema. Skin: No rashes, lesions or ulcers Psychiatry: Judgement and insight appear normal. Mood & affect appropriate. Data Reviewed:    Labs: Basic Metabolic Panel: Recent Labs  Lab 12/29/21 0208 12/31/21 0232 01/02/22 0700  NA 128* 130* 129*  K 5.1 4.5 4.5  CL 85* 89* 89*  CO2 24 23 22   GLUCOSE 113* 98 136*  BUN 81* 71* 71*  CREATININE 8.77* 7.57* 7.39*  CALCIUM 9.4 9.1 9.2  PHOS 8.9* 7.0*  --     GFR Estimated Creatinine Clearance: 14.5 mL/min (A) (by C-G formula based on SCr of 7.39 mg/dL (H)). Liver Function Tests: Recent Labs  Lab 12/29/21 0208 12/31/21 0232  ALBUMIN 2.8* 2.8*    No results for input(s): "LIPASE", "AMYLASE" in the last 168 hours. No results for input(s): "AMMONIA" in the last 168 hours. Coagulation profile No results for input(s): "INR", "PROTIME" in the last 168 hours. COVID-19 Labs  No results for input(s): "DDIMER", "FERRITIN", "LDH", "CRP" in the last 72 hours.  Lab Results  Component Value Date   SARSCOV2NAA NEGATIVE 11/06/2019   [place] NEGATIVE 10/11/2019   [place] NEGATIVE 07/06/2019   [place] NEGATIVE 05/21/2019    CBC: Recent Labs  Lab 12/31/21 0232 01/02/22 0700 01/03/22 0218 01/04/22 0141  WBC 14.1* 14.4* 16.3* 18.4*  NEUTROABS 11.0*  --   --   --   HGB 12.2* 11.4* 11.0* 10.8*  HCT 36.8* 34.6* 34.8* 32.8*  MCV 103.7* 107.1* 106.7* 105.8*  PLT 269 212 269 257    Cardiac Enzymes: No results for input(s): "CKTOTAL", "CKMB", "CKMBINDEX",  "TROPONINI" in the last 168 hours. BNP (last 3 results) No results for input(s): "PROBNP" in the last 8760 hours. CBG: Recent Labs  Lab 01/03/22 0748 01/03/22 1135 01/03/22 1622 01/04/22 0420 01/04/22 1000  GLUCAP 128* 116* 197* 132* 179*    D-Dimer: No results for input(s): "DDIMER" in the last 72 hours. Hgb A1c: No results for input(s): "HGBA1C" in the last 72 hours. Lipid Profile: No results for input(s): "CHOL", "HDL", "LDLCALC", "TRIG", "CHOLHDL", "LDLDIRECT" in the last 72 hours. Thyroid function studies: No results for input(s): "TSH", "T4TOTAL", "T3FREE", "THYROIDAB" in the last 72 hours.  Invalid input(s): "FREET3" Anemia work up: No results for input(s): "VITAMINB12", "FOLATE", "FERRITIN", "TIBC", "IRON", "RETICCTPCT" in the last 72 hours. Sepsis Labs: Recent Labs  Lab 12/31/21 0232 01/02/22 0700 01/03/22 0218 01/04/22 0141  WBC 14.1* 14.4* 16.3* 18.4*    Microbiology Recent Results (from the past 240 hour(s))  Blood culture (routine x 2)  Status: None   Collection Time: 12/25/21  7:03 PM   Specimen: BLOOD  Result Value Ref Range Status   Specimen Description BLOOD RIGHT ANTECUBITAL  Final   Special Requests   Final    BOTTLES DRAWN AEROBIC AND ANAEROBIC Blood Culture adequate volume   Culture   Final    NO GROWTH 5 DAYS Performed at [facility], [address], [place], [place] [address]    Report Status 12/30/2021 FINAL  Final  Blood culture (routine x 2)     Status: Abnormal   Collection Time: 12/26/21 12:40 AM   Specimen: BLOOD RIGHT FOREARM  Result Value Ref Range Status   Specimen Description BLOOD RIGHT FOREARM  Final   Special Requests   Final    BOTTLES DRAWN AEROBIC AND ANAEROBIC Blood Culture adequate volume   Culture  Setup Time   Final    GRAM NEGATIVE RODS BOTTLES DRAWN AEROBIC ONLY CRITICAL RESULT CALLED TO, READ BACK BY AND VERIFIED WITH: PHARMD Firthcliffe ON 12/26/21 @ 2027 BY DRT Performed at [facility],  [place] [address] [place], [place] [address]    Culture [facility] MORGANII (A)  Final   Report Status 12/28/2021 FINAL  Final   Organism ID, Bacteria MORGANELLA MORGANII  Final      Susceptibility   Morganella morganii - MIC*    AMPICILLIN >=32 RESISTANT Resistant     CEFAZOLIN >=64 RESISTANT Resistant     CEFTAZIDIME 2 SENSITIVE Sensitive     CIPROFLOXACIN <=0.25 SENSITIVE Sensitive     GENTAMICIN <=1 SENSITIVE Sensitive     IMIPENEM 2 SENSITIVE Sensitive     TRIMETH/SULFA <=20 SENSITIVE Sensitive     AMPICILLIN/SULBACTAM >=32 RESISTANT Resistant     PIP/TAZO <=4 SENSITIVE Sensitive     * MORGANELLA MORGANII  Blood Culture ID Panel (Reflexed)     Status: Abnormal   Collection Time: 12/26/21 12:40 AM  Result Value Ref Range Status   Enterococcus faecalis NOT DETECTED NOT DETECTED Final   Enterococcus Faecium NOT DETECTED NOT DETECTED Final   Listeria monocytogenes NOT DETECTED NOT DETECTED Final   Staphylococcus species NOT DETECTED NOT DETECTED Final   Staphylococcus aureus (BCID) NOT DETECTED NOT DETECTED Final   Staphylococcus epidermidis NOT DETECTED NOT DETECTED Final   Staphylococcus lugdunensis NOT DETECTED NOT DETECTED Final   Streptococcus species NOT DETECTED NOT DETECTED Final   Streptococcus agalactiae NOT DETECTED NOT DETECTED Final   Streptococcus pneumoniae NOT DETECTED NOT DETECTED Final   Streptococcus pyogenes NOT DETECTED NOT DETECTED Final   A.calcoaceticus-baumannii NOT DETECTED NOT DETECTED Final   Bacteroides fragilis NOT DETECTED NOT DETECTED Final   Enterobacterales DETECTED (A) NOT DETECTED Final    Comment: Enterobacterales represent a large order of gram negative bacteria, not a single organism. Refer to culture for further identification. CRITICAL RESULT CALLED TO, READ BACK BY AND VERIFIED WITH: PHARMD CARLA JARDIN ON 12/26/21 @ 2027 BY DRT    Enterobacter cloacae complex NOT DETECTED NOT DETECTED Final   Escherichia coli NOT DETECTED NOT DETECTED Final    Klebsiella aerogenes NOT DETECTED NOT DETECTED Final   Klebsiella oxytoca NOT DETECTED NOT DETECTED Final   Klebsiella pneumoniae NOT DETECTED NOT DETECTED Final   Proteus species NOT DETECTED NOT DETECTED Final   Salmonella species NOT DETECTED NOT DETECTED Final   Serratia marcescens NOT DETECTED NOT DETECTED Final   Haemophilus influenzae NOT DETECTED NOT DETECTED Final   Neisseria meningitidis NOT DETECTED NOT DETECTED Final   Pseudomonas aeruginosa NOT  DETECTED NOT DETECTED Final   Stenotrophomonas maltophilia NOT DETECTED NOT DETECTED Final   Candida albicans NOT DETECTED NOT DETECTED Final   Candida auris NOT DETECTED NOT DETECTED Final   Candida glabrata NOT DETECTED NOT DETECTED Final   Candida krusei NOT DETECTED NOT DETECTED Final   Candida parapsilosis NOT DETECTED NOT DETECTED Final   Candida tropicalis NOT DETECTED NOT DETECTED Final   Cryptococcus neoformans/gattii NOT DETECTED NOT DETECTED Final   CTX-M ESBL NOT DETECTED NOT DETECTED Final   Carbapenem resistance IMP NOT DETECTED NOT DETECTED Final   Carbapenem resistance KPC NOT DETECTED NOT DETECTED Final   Carbapenem resistance NDM NOT DETECTED NOT DETECTED Final   Carbapenem resist OXA 48 LIKE NOT DETECTED NOT DETECTED Final   Carbapenem resistance VIM NOT DETECTED NOT DETECTED Final    Comment: Performed at [facility], [address] [place], [place] [address]  Surgical pcr screen     Status: None   Collection Time: 12/30/21  3:11 PM   Specimen: Nasal Mucosa; Nasal Swab  Result Value Ref Range Status   MRSA, PCR NEGATIVE NEGATIVE Final   Staphylococcus aureus NEGATIVE NEGATIVE Final    Comment: (NOTE) The Xpert SA Assay (FDA approved for NASAL specimens in patients 51 years of age and older), is one component of a comprehensive surveillance program. It is not intended to diagnose infection nor to guide or monitor treatment. Performed at [facility], [place] [address], [place], [place] [address]       Medications:    vitamin C  1,000 mg Oral Daily   aspirin EC  81 mg Oral Daily   atorvastatin  40 mg Oral QHS   bromocriptine  2.5 mg Oral QHS   buPROPion  300 mg Oral q AM   calcitRIOL  1 mcg Oral Q M,W,F-HD   Chlorhexidine Gluconate Cloth  6 each Topical Q0600   Chlorhexidine Gluconate Cloth  6 each Topical Q0600   Chlorhexidine Gluconate Cloth  6 each Topical Q0600   docusate sodium  100 mg Oral Daily   DULoxetine  20 mg Oral BID   ferric citrate  630 mg Oral TID WC   heparin  5,000 Units Subcutaneous Q8H   insulin aspart  0-6 Units Subcutaneous TID WC   insulin aspart  2 Units Subcutaneous TID WC   melatonin  10 mg Oral QHS   metoprolol tartrate  25 mg Oral BID   midodrine  10 mg Oral Q M,W,F   multivitamin  1 tablet Oral QHS   nutrition supplement (JUVEN)  1 packet Oral BID BM   pantoprazole  40 mg Oral Daily   tamsulosin  0.4 mg Oral QHS   urea   Topical BID   zinc sulfate  220 mg Oral Daily   Continuous Infusions:  sodium chloride 10 mL/hr at 01/02/22 1811   ceFEPime (MAXIPIME) IV 1 g (01/04/22 0949)   magnesium sulfate bolus IVPB        LOS: 9 days   Charlynne Cousins  [facility]  01/04/2022, 10:39 AM

## 2022-01-04 NOTE — Progress Notes (Signed)
Inpatient Rehab Admissions Coordinator:  Attempted to get in touch with pt's brother again. Left a message; awaiting return call.   Gayland Curry, [place], [place] Admissions Coordinator [phone]

## 2022-01-04 NOTE — Progress Notes (Signed)
[place] NP notified of BP 101/10 with HR 57.  Order obtained to hold tonight's dose of Metoprolol.  There are no hold parameters noted with his Metoprolol. Ayesha Mohair BSN RN [place] 01/04/2022, 10:18 PM

## 2022-01-05 DIAGNOSIS — I10 Essential (primary) hypertension: Secondary | ICD-10-CM | POA: Diagnosis not present

## 2022-01-05 DIAGNOSIS — M86171 Other acute osteomyelitis, right ankle and foot: Secondary | ICD-10-CM | POA: Diagnosis not present

## 2022-01-05 DIAGNOSIS — N186 End stage renal disease: Secondary | ICD-10-CM | POA: Diagnosis not present

## 2022-01-05 LAB — GLUCOSE, CAPILLARY
Glucose-Capillary: 103 mg/dL — ABNORMAL HIGH (ref 70–99)
Glucose-Capillary: 142 mg/dL — ABNORMAL HIGH (ref 70–99)
Glucose-Capillary: 127 mg/dL — ABNORMAL HIGH (ref 70–99)
Glucose-Capillary: 178 mg/dL — ABNORMAL HIGH (ref 70–99)

## 2022-01-05 LAB — RENAL FUNCTION PANEL
Albumin: 2.6 g/dL — ABNORMAL LOW (ref 3.5–5.0)
Anion gap: 21 — ABNORMAL HIGH (ref 5–15)
BUN: 102 mg/dL — ABNORMAL HIGH (ref 6–20)
CO2: 20 mmol/L — ABNORMAL LOW (ref 22–32)
Calcium: 9.1 mg/dL (ref 8.9–10.3)
Chloride: 89 mmol/L — ABNORMAL LOW (ref 98–111)
Creatinine, Ser: 8.11 mg/dL — ABNORMAL HIGH (ref 0.61–1.24)
GFR, Estimated: 7 mL/min — ABNORMAL LOW (ref >60)
Glucose, Bld: 133 mg/dL — ABNORMAL HIGH (ref 70–99)
Phosphorus: 6.3 mg/dL — ABNORMAL HIGH (ref 2.5–4.6)
Potassium: 5 mmol/L (ref 3.5–5.1)
Sodium: 130 mmol/L — ABNORMAL LOW (ref 135–145)

## 2022-01-05 LAB — CBC
HCT: 33.9 % — ABNORMAL LOW (ref 39.0–52.0)
Hemoglobin: 11.3 g/dL — ABNORMAL LOW (ref 13.0–17.0)
MCH: 35 pg — ABNORMAL HIGH (ref 26.0–34.0)
MCHC: 33.3 g/dL (ref 30.0–36.0)
MCV: 105 fL — ABNORMAL HIGH (ref 80.0–100.0)
Platelets: 239 10*3/uL (ref 150–400)
RBC: 3.23 MIL/uL — ABNORMAL LOW (ref 4.22–5.81)
RDW: 15.8 % — ABNORMAL HIGH (ref 11.5–15.5)
WBC: 13 10*3/uL — ABNORMAL HIGH (ref 4.0–10.5)
nRBC: 0 % (ref 0.0–0.2)

## 2022-01-05 LAB — SURGICAL PATHOLOGY

## 2022-01-05 MED ORDER — LIDOCAINE HCL (PF) 1 % IJ SOLN: 5.0000 mL | INTRAMUSCULAR | Status: DC | PRN

## 2022-01-05 MED ORDER — ANTICOAGULANT SODIUM CITRATE 4% (200MG/5ML) IV SOLN: 5.0000 mL | Status: DC | PRN

## 2022-01-05 MED ORDER — ALTEPLASE 2 MG IJ SOLR
2.0000 mg | Freq: Once | INTRAMUSCULAR | Status: DC | PRN
Start: 2022-01-05 — End: 2022-01-05

## 2022-01-05 MED ORDER — SODIUM THIOSULFATE 250 MG/ML IV SOLN
25.0000 g | INTRAVENOUS | Status: DC
  Administered 2022-01-05: 25 g via INTRAVENOUS
  Filled 2022-01-05: qty 100

## 2022-01-05 MED ORDER — PENTAFLUOROPROP-TETRAFLUOROETH EX AERO
1.0000 | INHALATION_SPRAY | CUTANEOUS | Status: DC | PRN
Start: 2022-01-05 — End: 2022-01-05

## 2022-01-05 MED ORDER — PENTAFLUOROPROP-TETRAFLUOROETH EX AERO: INHALATION_SPRAY | CUTANEOUS | Status: DC | PRN

## 2022-01-05 MED ORDER — LIDOCAINE-PRILOCAINE 2.5-2.5 % EX CREA: TOPICAL_CREAM | CUTANEOUS | Status: DC | PRN

## 2022-01-05 NOTE — Progress Notes (Signed)
Inpatient Rehab Admissions Coordinator:  Talked to pt's brother Moises. He acknowledged understanding of CIR goals and expectations. He is supportive of his brother pursuing CIR. He confirmed that he will be able to provide intermittent support for brother and that his wife could provide 24/7 supervision for pt after discharge. Will continue to follow.  Gayland Curry, [place], [place] Admissions Coordinator [phone]

## 2022-01-05 NOTE — Progress Notes (Signed)
OT Cancellation Note  Patient Details Name: Yaser Harvill MRN: [id number] DOB: [birth year]   Cancelled Treatment:    Reason Eval/Treat Not Completed: Patient at procedure or test/ unavailable (Pt at HD.)  Jefferey Pica, OTR/L Acute Rehabilitation Services Pager: [phone] Office: [phone]   Alexzandria Massman C 01/05/2022, 12:25 PM

## 2022-01-05 NOTE — Progress Notes (Addendum)
[facility] Progress Note   Subjective: Seen on HD. They haven't found his telephone but he is reason and calm about situation. Very pleasant. No C/Os. Tolerating HD without issues.     Objective Vitals:   01/05/22 0810 01/05/22 0811 01/05/22 0818 01/05/22 0825  BP:  125/79  116/71  Pulse:  61  61  Resp:  20  (!) 23  Temp:  (!) 97.5 F (36.4 C)    TempSrc:  Oral    SpO2:  100%  98%  Weight: 103.3 kg  103.3 kg   Height:       Physical Exam General: Chronically ill appearing male in NAD Heart: S1,S2 No M/R/G SR on monitor rate 70s rare pacs.  Lungs: CTAB Abdomen:NABS, NT Extremities: R BKA with wound vac intact. L BKA with large necrotic appearing wound approx 3 inches below L knee, another smaller similar area to lateral side of his knee. Another lesion R index finger.  Dialysis Access: L AVF Cannulated at present.     Additional Objective Labs: Basic Metabolic Panel: Recent Labs  Lab 12/31/21 0232 01/02/22 0700  NA 130* 129*  K 4.5 4.5  CL 89* 89*  CO2 23 22  GLUCOSE 98 136*  BUN 71* 71*  CREATININE 7.57* 7.39*  CALCIUM 9.1 9.2  PHOS 7.0*  --    Liver Function Tests: Recent Labs  Lab 12/31/21 0232  ALBUMIN 2.8*   No results for input(s): "LIPASE", "AMYLASE" in the last 168 hours. CBC: Recent Labs  Lab 12/31/21 0232 01/02/22 0700 01/03/22 0218 01/04/22 0141 01/05/22 0825  WBC 14.1* 14.4* 16.3* 18.4* 13.0*  NEUTROABS 11.0*  --   --   --   --   HGB 12.2* 11.4* 11.0* 10.8* 11.3*  HCT 36.8* 34.6* 34.8* 32.8* 33.9*  MCV 103.7* 107.1* 106.7* 105.8* 105.0*  PLT 269 212 269 257 239   Blood Culture    Component Value Date/Time   SDES BLOOD RIGHT FOREARM 12/26/2021 0040   SPECREQUEST  12/26/2021 0040    BOTTLES DRAWN AEROBIC AND ANAEROBIC Blood Culture adequate volume   CULT MORGANELLA MORGANII (A) 12/26/2021 0040   REPTSTATUS 12/28/2021 FINAL 12/26/2021 0040    Cardiac Enzymes: No results for input(s): "CKTOTAL", "CKMB",  "CKMBINDEX", "TROPONINI" in the last 168 hours. CBG: Recent Labs  Lab 01/04/22 1000 01/04/22 1250 01/04/22 1735 01/04/22 2107 01/05/22 0754  GLUCAP 179* 278* 138* 177* 103*   Iron Studies: No results for input(s): "IRON", "TIBC", "TRANSFERRIN", "FERRITIN" in the last 72 hours. @lablastinr3 @ Studies/Results: No results found. Medications:  sodium chloride 10 mL/hr at 01/02/22 1811   anticoagulant sodium citrate     magnesium sulfate bolus IVPB      vitamin C  1,000 mg Oral Daily   aspirin EC  81 mg Oral Daily   atorvastatin  40 mg Oral QHS   bromocriptine  2.5 mg Oral QHS   buPROPion  300 mg Oral q AM   calcitRIOL  1 mcg Oral Q M,W,F-HD   Chlorhexidine Gluconate Cloth  6 each Topical Q0600   clopidogrel  75 mg Oral Daily   docusate sodium  100 mg Oral Daily   DULoxetine  20 mg Oral BID   ferric citrate  630 mg Oral TID WC   heparin  5,000 Units Subcutaneous Q8H   insulin aspart  0-6 Units Subcutaneous TID WC   insulin aspart  2 Units Subcutaneous TID WC   insulin detemir  5 Units Subcutaneous Daily   melatonin  10 mg  Oral QHS   metoprolol tartrate  25 mg Oral BID   midodrine  10 mg Oral Q M,W,F   multivitamin  1 tablet Oral QHS   nutrition supplement (JUVEN)  1 packet Oral BID BM   pantoprazole  40 mg Oral Daily   tamsulosin  0.4 mg Oral QHS   urea   Topical BID   zinc sulfate  220 mg Oral Daily     Dialysis Orders: [place] on MWF 180NRe 5 hours BFR 400 DFR 500 EDW 98.4kg 2K 2Ca AVF 15g needles - No heparin/ESA - Calcitriol 1 mcg PO q HD   Assessment/Plan: Chronic RLE wound/osteomyelitis: Failed limb salvage attempts, 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 Wound vac in place. Per ortho. ABX have been discontinued.  ESRD: Continue HD on usual MWF schedule - next HD on Monday 6/19. BP/volume: BP has been low, amlodipine d/c'd. Remains on metop BID with holding parameters. Using midodrine pre-HD. EDW will need to be adjusted s/p amputation on discharge. Anemia of ESRD: Hgb 11.3  today.   Secondary Hyperparathyroidism: CorrCa high sided, calcitriol reduced 60mcg -> 60mcg q HD and now stopping Phos high, changed to usual OP binder Lorin Picket).  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 Will resume Sodium Thiosulfate 25 gram with HD TIW. He agrees to plan. Needs lower calcium levels. Stop VDRA. Use 2.0 Ca Bath Nutrition: Alb low, continue supplements. T2DM- per primary.   Rita H. Brown NP-C 01/05/2022, 8:56 AM  [employer] [phone]     Seen and examined independently.  Agree with note and exam as documented above by physician extender and as noted here.  He is off of HD currently as his circuit was changed.   General adult male in bed in no acute distress HEENT normocephalic atraumatic extraocular movements intact sclera anicteric Neck supple trachea midline Lungs clear to auscultation bilaterally normal work of breathing at rest  Heart S1S2 no rub Abdomen soft nontender nondistended Extremities bilateral BKA   Psych normal mood and affect Access LUE AVF bruit and thrill on exam earlier today   ESRD - HD today per MWF schedule   Chronic right LE wound and osteo - now s/p right BKA on 6/16  Hx calciphylaxis. Now with concern for recurrence - start sodium thiosulfate and stop calcitriol for now   Anemia CKD Hb 11.3 - meets goal  Claudia Desanctis, MD 01/05/2022  9:37 AM

## 2022-01-05 NOTE — Progress Notes (Signed)
[facility]                                                                               [name], is a 51 y.o. male, DOB - [birth year], WJX:[id number] Admit date - 12/25/2021    Outpatient Primary MD for the patient is [name], MD  LOS - 10  days    Brief summary   Michael Riggs is an 51 y.o. 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 Sharol Given as an outpatient and told to to the office on 12/26/2031 Returned to the emergency room found to have a breakdown of his calcaneus posteriorly found to have osteomyelitis in the right calcaneus area was started on antibiotics and orthopedic surgery was consulted.   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   Patient is back on aspirin 81 mg, Plavix 75 mg and Lipitor 40 mg daily Pain control with oxycodone Therapy evaluations recommending CIR.   End-stage renal disease on dialysis Monday Wednesday and Friday. Nephrology on board and further recommendations to follow.   Transient hypotension Resolved continue with midodrine prior to HD    Insulin-dependent type 2 diabetes mellitus CBG (last 3)  Recent Labs    01/04/22 1735 01/04/22 2107 01/05/22 0754  GLUCAP 138* 177* 103*  Continue with low-dose Levemir and sliding scale insulin.    Essential hypertension On metoprolol 25 mg twice daily   Diabetic peripheral neuropathy    BPH Continue with Flomax        Estimated body mass index is 32.68 kg/m as calculated from the following:   Height as of this encounter: 5\' 10"  (1.778 m).   Weight as of this encounter: 103.3 kg.  Code Status: full code.  DVT Prophylaxis:  heparin injection 5,000 Units Start: 12/26/21 0600   Level of Care: Level of care: Telemetry Medical Family Communication: none at bedside.   Disposition Plan:     Remains inpatient appropriate:  cir evaluation.   Procedures:  S/p Right BKA.  Consultants:   Orthopedics.   Antimicrobials:   Anti-infectives (From admission, onward)    Start     Dose/Rate Route Frequency Ordered Stop   01/02/22 1600  ceFAZolin (ANCEF) IVPB 2g/100 mL premix        2 g 200 mL/hr over 30 Minutes Intravenous Every 8 hours 01/02/22 1052 01/03/22 0024   12/31/21 0600  ceFAZolin (ANCEF) IVPB 2g/100 mL premix        2 g 200 mL/hr over 30 Minutes Intravenous On call to O.R. 12/30/21 1237 01/01/22 0559   12/26/21 1230  ceFEPIme (MAXIPIME) 1 g in sodium chloride 0.9 % 100 mL IVPB  Status:  Discontinued  1 g 200 mL/hr over 30 Minutes Intravenous Every 24 hours 12/26/21 1227 01/04/22 1337        Medications  Scheduled Meds:  vitamin C  1,000 mg Oral Daily   aspirin EC  81 mg Oral Daily   atorvastatin  40 mg Oral QHS   bromocriptine  2.5 mg Oral QHS   buPROPion  300 mg Oral q AM   calcitRIOL  1 mcg Oral Q M,W,F-HD   Chlorhexidine Gluconate Cloth  6 each Topical Q0600   clopidogrel  75 mg Oral Daily   docusate sodium  100 mg Oral Daily   DULoxetine  20 mg Oral BID   ferric citrate  630 mg Oral TID WC   heparin  5,000 Units Subcutaneous Q8H   insulin aspart  0-6 Units Subcutaneous TID WC   insulin aspart  2 Units Subcutaneous TID WC   insulin detemir  5 Units Subcutaneous Daily   melatonin  10 mg Oral QHS   metoprolol tartrate  25 mg Oral BID   midodrine  10 mg Oral Q M,W,F   multivitamin  1 tablet Oral QHS   nutrition supplement (JUVEN)  1 packet Oral BID BM   pantoprazole   40 mg Oral Daily   tamsulosin  0.4 mg Oral QHS   urea   Topical BID   zinc sulfate  220 mg Oral Daily   Continuous Infusions:  sodium chloride 10 mL/hr at 01/02/22 1811   magnesium sulfate bolus IVPB     PRN Meds:.acetaminophen, albuterol, bisacodyl, guaiFENesin-dextromethorphan, hydrALAZINE, HYDROmorphone (DILAUDID) injection, HYDROmorphone, hydrOXYzine, ipratropium-albuterol, labetalol, loperamide HCl, magnesium sulfate bolus IVPB, metoprolol tartrate, ondansetron, oxyCODONE, oxyCODONE, phenol, polyethylene glycol    Subjective:   Michael Riggs was seen and examined today.  No new complaints, interested in cir.   Objective:   Vitals:   01/04/22 2000 01/05/22 0418 01/05/22 0753 01/05/22 0810  BP: 101/71 (!) 111/91 119/81   Pulse: (!) 57 60 60   Resp: 16 16 18    Temp: 98.5 F (36.9 C) 98.2 F (36.8 C) 97.6 F (36.4 C)   TempSrc:   Oral   SpO2: 97% 100% 95%   Weight:    103.3 kg  Height:        Intake/Output Summary (Last 24 hours) at 01/05/2022 0815 Last data filed at 01/05/2022 0800 Gross per 24 hour  Intake 520 ml  Output 0 ml  Net 520 ml   Filed Weights   01/02/22 1622 01/02/22 2054 01/05/22 0810  Weight: 102.9 kg 101.7 kg 103.3 kg     Exam General: Alert and oriented x 3, NAD Cardiovascular: S1 S2 auscultated, no murmurs, RRR Respiratory: Clear to auscultation bilaterally, no wheezing, rales or rhonchi Gastrointestinal: Soft, nontender, nondistended, + bowel sounds Ext: RIGHT BKA, LEFT BKA.  Neuro: AAOx3, Cr N's II- XII. Strength 5/5 upper and lower extremities bilaterally Skin: No rashes Psych: Normal affect and demeanor, alert and oriented x3    Data Reviewed:  I have personally reviewed following labs and imaging studies   CBC Lab Results  Component Value Date   WBC 18.4 (H) 01/04/2022   RBC 3.10 (L) 01/04/2022   HGB 10.8 (L) 01/04/2022   HCT 32.8 (L) 01/04/2022   MCV 105.8 (H) 01/04/2022   MCH 34.8 (H) 01/04/2022   PLT 257 01/04/2022    MCHC 32.9 01/04/2022   RDW 15.9 (H) 01/04/2022   LYMPHSABS 1.3 12/31/2021   MONOABS 1.1 (H) 12/31/2021   EOSABS 0.3 12/31/2021   BASOSABS 0.1 12/31/2021  Last metabolic panel Lab Results  Component Value Date   NA 129 (L) 01/02/2022   K 4.5 01/02/2022   CL 89 (L) 01/02/2022   CO2 22 01/02/2022   BUN 71 (H) 01/02/2022   CREATININE 7.39 (H) 01/02/2022   GLUCOSE 136 (H) 01/02/2022   GFRNONAA 8 (L) 01/02/2022   GFRAA 8 (L) 11/10/2019   CALCIUM 9.2 01/02/2022   PHOS 7.0 (H) 12/31/2021   PROT 7.8 12/25/2021   ALBUMIN 2.8 (L) 12/31/2021   BILITOT 1.3 (H) 12/25/2021   ALKPHOS 212 (H) 12/25/2021   AST 42 (H) 12/25/2021   ALT 86 (H) 12/25/2021   ANIONGAP 18 (H) 01/02/2022    CBG (last 3)  Recent Labs    01/04/22 1735 01/04/22 2107 01/05/22 0754  GLUCAP 138* 177* 103*      Coagulation Profile: No results for input(s): "INR", "PROTIME" in the last 168 hours.   Radiology Studies: No results found.     Hosie Poisson M.D. [facility] 01/05/2022, 8:15 AM  Available via Epic secure chat 7am-7pm After 7 pm, please refer to night coverage provider listed on amion.

## 2022-01-05 NOTE — Progress Notes (Signed)
[name], male   DOB: March 21, 1971, 51 y.o.   MRN: [id number] Patient is postoperative day 3 right below-knee amputation also status post left below-knee amputation with ischemic ulcers on the residual limb on the left.  Anticipate patient will need inpatient versus outpatient rehab.  He will need additional durable medical equipment at home to facilitate transfer from bed to chair.

## 2022-01-05 NOTE — PMR Pre-admission (Signed)
PMR Admission Coordinator Pre-Admission Assessment  Patient: Michael Riggs is an 51 y.o., male MRN: [id number] DOB: 15-Apr-1971 Height: '5\' 10"'  (177.8 cm) Weight: 103.3 kg  Insurance Information HMO:     PPO:      PCP:      IPA:      80/20: yes     OTHER:  PRIMARY: Medicare A & B      Policy#: [id number]      Subscriber: patient CM Name:       Phone#:      Fax#:  Pre-Cert#:       Employer:  Benefits:  Phone #: verified eligibility via [employer] on 01/05/22     Name:  Eff. Date: Part A & B effective 05/20/17     Deduct: $1,600      Out of Pocket Max: NA      Life Max: NA CIR: 100% coverage      SNF: 100% coverage days 1-20, 80% coverage days 21-100 Outpatient: 80% coverage     Co-Pay: 20% Home Health: 100% coveage      Co-Pay:  DME: 80% coverage     Co-Pay: 20% Providers: pt's choice SECONDARY: Medicaid [place] Access    Policy#: [id number]     Phone#: [phone]  Financial Counselor:       Phone#:   The "Data Collection Information Summary" for patients in Inpatient Rehabilitation Facilities with attached "Privacy Act [place] Records" was provided and verbally reviewed with: {CHL IP Patient Family VX:[id number]}  Emergency Contact Information Contact Information     Name Relation Home Work Donald Sister   [phone]   Malcolm, Quast [phone]  [phone]   Holman, Bonsignore Mother [phone]         Current Medical History  Patient Admitting Diagnosis: s/p R BKA History of Present Illness: ***    Patient's medical record from [facility] has been reviewed by the rehabilitation admission coordinator and physician.  Past Medical History  Past Medical History:  Diagnosis Date   Anemia    Anemia of chronic disease    Anxiety    ARF (acute renal failure) ([place]) 01/30/2019   Calciphylaxis    Controlled type 2 diabetes mellitus with hyperglycemia, without long-term current use of insulin ([place])    Debility 02/09/2019    Depression    Diabetes mellitus type 2 in obese ([place]) 07/04/2013   Diabetes mellitus with peripheral vascular disease ([place])    type 2 no meds, lost 100 lbs   Dyslipidemia    Dyspnea    inhaler   End stage renal disease ([place])    ESRD (end stage renal disease) on dialysis ([place]) 01/2019   MWFS   ESRD on dialysis [facility])    Essential hypertension    Fluid overload 02/04/2019   GERD (gastroesophageal reflux disease)    Goals of care, counseling/discussion    Habitual alcohol use 07/04/2013   Headache    Hyperkalemia 01/30/2019   Hypertension    Hypoglycemia 01/30/2019   Hypokalemia 01/30/2019   Labile blood pressure    Leukocytosis    Lower GI bleed 05/14/2019   Macrocytic anemia 01/30/2019   Obesity, Class II, BMI 35-39.9, with comorbidity 07/04/2013   Palliative care encounter    PDR (proliferative diabetic retinopathy) ([place]) 12/14/2013   Physical deconditioning    Pressure injury of skin 04/27/2019   Pruritus    Scrotal edema    Scrotal pain    Sleep apnea    uses CPAP  Sleep disturbance    Slow transit constipation    Status post below-knee amputation of left lower extremity ([place])    Stroke ([place])    mini -shown on CT scan   Syphilis 01/2019   Urinary retention    Venous hypertension 09/22/2013   Wound infection 03/28/2019    Has the patient had major surgery during 100 days prior to admission? Yes  Family History   family history includes Diabetes in his mother; Multiple myeloma in his mother.  Current Medications  Current Facility-Administered Medications:    0.9 %  sodium chloride infusion, , Intravenous, Continuous, Charlynne Cousins, MD, Last Rate: 10 mL/hr at 01/02/22 1811, New Bag at 01/02/22 1811   acetaminophen (TYLENOL) tablet 325-650 mg, 325-650 mg, Oral, Q6H PRN, [name], MD, 650 mg at 01/05/22 1548   albuterol (PROVENTIL) (2.5 MG/3ML) 0.083% nebulizer solution 3 mL, 3 mL, Inhalation, Q6H PRN, [name], MD   ascorbic acid (VITAMIN C) tablet 1,000  mg, 1,000 mg, Oral, Daily, [name], MD, 1,000 mg at 01/04/22 6967   aspirin EC tablet 81 mg, 81 mg, Oral, Daily, [name], MD, 81 mg at 01/05/22 1544   atorvastatin (LIPITOR) tablet 40 mg, 40 mg, Oral, QHS, [name], MD, 40 mg at 01/04/22 2202   bisacodyl (DULCOLAX) EC tablet 5 mg, 5 mg, Oral, Daily PRN, [name], MD   bromocriptine (PARLODEL) tablet 2.5 mg, 2.5 mg, Oral, QHS, [name], MD, 2.5 mg at 01/04/22 2202   buPROPion (WELLBUTRIN XL) 24 hr tablet 300 mg, 300 mg, Oral, q AM, [name], MD, 300 mg at 01/05/22 0631   Chlorhexidine Gluconate Cloth 2 % PADS 6 each, 6 each, Topical, Q0600, [name], MD, 6 each at 01/05/22 0544   clopidogrel (PLAVIX) tablet 75 mg, 75 mg, Oral, Daily, Charlynne Cousins, MD, 75 mg at 01/05/22 1544   docusate sodium (COLACE) capsule 100 mg, 100 mg, Oral, Daily, [name], MD, 100 mg at 01/04/22 8938   DULoxetine (CYMBALTA) DR capsule 20 mg, 20 mg, Oral, BID, [name], MD, 20 mg at 01/04/22 2202   ferric citrate (AURYXIA) tablet 630 mg, 630 mg, Oral, TID WC, Loren Racer, PA-C, 630 mg at 01/05/22 0634   guaiFENesin-dextromethorphan (ROBITUSSIN DM) 100-10 MG/5ML syrup 15 mL, 15 mL, Oral, Q4H PRN, [name], MD   heparin injection 5,000 Units, 5,000 Units, Subcutaneous, Q8H, [name], MD, 5,000 Units at 01/05/22 0544   hydrALAZINE (APRESOLINE) injection 5 mg, 5 mg, Intravenous, Q20 Min PRN, [name], MD   HYDROmorphone (DILAUDID) injection 0.5-1 mg, 0.5-1 mg, Intravenous, Q4H PRN, [name], MD, 1 mg at 01/02/22 2341   HYDROmorphone (DILAUDID) tablet 4 mg, 4 mg, Oral, Q8H PRN, [name], MD, 4 mg at 01/05/22 1206   hydrOXYzine (ATARAX) tablet 10 mg, 10 mg, Oral, TID PRN, [name], MD, 10 mg at 01/04/22 1948   insulin aspart (novoLOG) injection 0-6 Units, 0-6 Units, Subcutaneous, TID WC, [name], MD, 3 Units at 01/04/22 1202   insulin aspart (novoLOG) injection 2 Units,  2 Units, Subcutaneous, TID WC, Charlynne Cousins, MD, 2 Units at 01/04/22 1800   insulin detemir (LEVEMIR) injection 5 Units, 5 Units, Subcutaneous, Daily, Charlynne Cousins, MD, 5 Units at 01/05/22 1543   ipratropium-albuterol (DUONEB) 0.5-2.5 (3) MG/3ML nebulizer solution 3 mL, 3 mL, Nebulization, Q6H PRN, [name], MD   labetalol (NORMODYNE)  injection 10 mg, 10 mg, Intravenous, Q10 min PRN, [name], MD   loperamide HCl (IMODIUM) 1 MG/7.5ML suspension 4 mg, 4 mg, Oral, Q6H PRN, [name], MD, 4 mg at 01/01/22 2215   magnesium sulfate IVPB 2 g 50 mL, 2 g, Intravenous, Daily PRN, [name], MD   melatonin tablet 10 mg, 10 mg, Oral, QHS, [name], MD, 10 mg at 01/04/22 2202   metoprolol tartrate (LOPRESSOR) injection 2-5 mg, 2-5 mg, Intravenous, Q2H PRN, [name], MD   metoprolol tartrate (LOPRESSOR) tablet 25 mg, 25 mg, Oral, BID, [name], MD, 25 mg at 01/04/22 0853   midodrine (PROAMATINE) tablet 10 mg, 10 mg, Oral, Q M,W,F, [name], MD, 10 mg at 01/05/22 1159   multivitamin (RENA-VIT) tablet 1 tablet, 1 tablet, Oral, QHS, [name], MD, 1 tablet at 01/04/22 2202   nutrition supplement (JUVEN) (JUVEN) powder packet 1 packet, 1 packet, Oral, BID BM, [name], MD, 1 packet at 01/04/22 1535   ondansetron (ZOFRAN) injection 4 mg, 4 mg, Intravenous, Q6H PRN, [name], MD   oxyCODONE (Oxy IR/ROXICODONE) immediate release tablet 10-15 mg, 10-15 mg, Oral, Q4H PRN, [name], MD, 15 mg at 01/03/22 2131   oxyCODONE (Oxy IR/ROXICODONE) immediate release tablet 5-10 mg, 5-10 mg, Oral, Q4H PRN, [name], MD, 10 mg at 01/02/22 1222   pantoprazole (PROTONIX) EC tablet 40 mg, 40 mg, Oral, Daily, [name], MD, 40 mg at 01/04/22 0852   phenol (CHLORASEPTIC) mouth spray 1 spray, 1 spray, Mouth/Throat, PRN, [name], MD   polyethylene glycol (MIRALAX / GLYCOLAX) packet 17 g, 17 g, Oral, Daily PRN, [name], MD    sodium thiosulfate 25 g in sodium chloride 0.9 % 200 mL Infusion for Calciphylaxis, 25 g, Intravenous, Q M,W,F-HD, Valentina Gu, NP, Stopping previously hung infusion at 01/05/22 1520   tamsulosin (FLOMAX) capsule 0.4 mg, 0.4 mg, Oral, QHS, [name], MD, 0.4 mg at 01/04/22 2202   urea 10 % lotion, , Topical, BID, [name], MD, Given at 01/04/22 2204   zinc sulfate capsule 220 mg, 220 mg, Oral, Daily, [name], MD, 220 mg at 01/04/22 8676  Patients Current Diet:  Diet Order             Diet regular Room service appropriate? Yes; Fluid consistency: Thin; Fluid restriction: 1200 mL Fluid  Diet effective now                   Precautions / Restrictions Precautions Precautions: Fall, Other (comment) Precaution Comments: multiple areas of tissue breakdown to B legs, elbows. Wound vac Restrictions Weight Bearing Restrictions: Yes RLE Weight Bearing: Non weight bearing LLE Weight Bearing: Non weight bearing Other Position/Activity Restrictions: Has a L prosthetic, wears for brief periods/transfers only.   Has the patient had 2 or more falls or a fall with injury in the past year? Yes  Prior Activity Level Community (5-7x/wk): got out of house to go to dialysis  Prior Functional Level Self Care: Did the patient need help bathing, dressing, using the toilet or eating? Independent  Indoor Mobility: Did the patient need assistance with walking from room to room (with or without device)? Independent  Stairs: Did the patient need assistance with internal or external stairs (with or without device)? {Prior Functional PPJKD:326712458}  Functional Cognition: Did the patient need help planning regular tasks such as shopping or remembering to  take medications? Independent  Patient Information Are you of Hispanic, Latino/a,or Spanish origin?: E. Yes, another Hispanic, Latino, or Spanish origin What is your race?: Z. None of the above Do you need or want an  interpreter to communicate with a doctor or health care staff?: 0. No  Patient's Response To:  Health Literacy and Transportation Is the patient able to respond to health literacy and transportation needs?: Yes Health Literacy - How often do you need to have someone help you when you read instructions, pamphlets, or other written material from your doctor or pharmacy?: Never In the past 12 months, has lack of transportation kept you from medical appointments or from getting medications?: No In the past 12 months, has lack of transportation kept you from meetings, work, or from getting things needed for daily living?: No  Home Assistive Devices / [place] Devices/Equipment: Radio producer (specify quad or straight) Home Equipment: Hand held shower head, Tub bench, Wheelchair - manual, Cane - single point, Hospital bed  Prior Device Use: Indicate devices/aids used by the patient prior to current illness, exacerbation or injury?  cane  Current Functional Level Cognition  Overall Cognitive Status: Within Functional Limits for tasks assessed Orientation Level: Oriented X4    Extremity Assessment (includes Sensation/Coordination)  Upper Extremity Assessment: Overall WFL for tasks assessed  Lower Extremity Assessment: Overall WFL for tasks assessed    ADLs  Overall ADL's : Needs assistance/impaired Eating/Feeding: Independent, Bed level Grooming: Wash/dry hands, Wash/dry face, Set up, Bed level Upper Body Bathing: Set up, Bed level Lower Body Bathing: Minimal assistance, Bed level Upper Body Dressing : Set up, Bed level Lower Body Dressing: Moderate assistance, Bed level Toilet Transfer Details (indicate cue type and reason): No drop arm in room.  Able to perform lateral scoot with a lot of momentum and Min Guard.    Mobility  Overal bed mobility: Needs Assistance Bed Mobility: Supine to Sit Supine to sit: Supervision, HOB elevated General bed mobility comments: brings bed to 45  degrees to pivot on his bottom.    Transfers  Overall transfer level: Needs assistance Equipment used: Sliding board Transfers: Bed to chair/wheelchair/BSC Bed to/from chair/wheelchair/BSC transfer type:: Lateral/scoot transfer, Anterior-posterior transfer  Lateral/Scoot Transfers: Mod assist, +2 physical assistance, +2 safety/equipment (3rd person to steady WC) General transfer comment: Bed pad and gait belt assist due to poor sitting balance for lateral slide board transfer. Bed pad and LE assist for AP transfer back to bed from [place] / Gait / Stairs / Environmental consultant mobility: Yes Wheelchair propulsion: Left upper extremity Wheelchair parts: Supervision/cueing Distance: 1f Wheelchair Assistance Details (indicate cue type and reason): to and from bathroom sink to perform self care, cueing required to lock brakes    Posture / Balance Dynamic Sitting Balance Sitting balance - Comments: LOB when donning gait belt Balance Overall balance assessment: Needs assistance Sitting-balance support: Bilateral upper extremity supported Sitting balance-Leahy Scale: Fair Sitting balance - Comments: LOB when donning gait belt Postural control: Posterior lean    Special needs/care consideration Continuous Drip IV  0.9% sodium chloride infusion, Dialysis: Hemodialysis Monday, Wednesday, and Friday, Wound Vac leg/right, Skin Surgical incision: leg/right; Abrasion: leg/bilateral; Erythema: leg/bilateral; Ecchymosis: bilateral, and Diabetic management Levemir: 5 units daily; novoLOG 2 units 3x daily with meals; novoLOG 0-6  units 3x daily with meals   Previous Home Environment (from acute therapy documentation) Living Arrangements: Parent, Other relatives (brother and sister-in-law)  Lives With: Family Available Help at Discharge:  Family, Available PRN/intermittently (mother and sister-in-law there 24/7 but not able to help pt) Type of Home:  Apartment Home Layout: One level Home Access: Level entry Bathroom Shower/Tub: Chiropodist: Standard Bathroom Accessibility: Yes How Accessible: Accessible via walker Home Care Services: Yes Type of Home Care Services: Homehealth aide Additional Comments: brother also lives with pt and provides assist as needed.  Discharge Living Setting Does the patient have any problems obtaining your medications?: No  Social/Family/Support Systems    Goals    Decrease burden of Care through IP rehab admission: NA  Possible need for SNF placement upon discharge: Not anticipated  Patient Condition: I have reviewed medical records from [facility], spoken with {CHL IP CSW AV:[id number]}, and patient and family member. I met with patient at the bedside and discussed via phone for inpatient rehabilitation assessment.  Patient will benefit from ongoing PT and OT, can actively participate in 3 hours of therapy a day 5 days of the week, and can make measurable gains during the admission.  Patient will also benefit from the coordinated team approach during an Inpatient Acute Rehabilitation admission.  The patient will receive intensive therapy as well as Rehabilitation physician, nursing, social worker, and care management interventions.  Due to safety, skin/wound care, disease management, medication administration, pain management, and patient education the patient requires 24 hour a day rehabilitation nursing.  The patient is currently *** with mobility and basic ADLs.  Discharge setting and therapy post discharge at home with home health is anticipated.  Patient has agreed to participate in the Acute Inpatient Rehabilitation Program and will admit {Time; today/tomorrow:10263}.  Preadmission Screen Completed By:  Bethel Born, 01/05/2022 4:37 PM ______________________________________________________________________   Discussed status with Dr. Marland Kitchen on *** at *** and received  approval for admission today.  Admission Coordinator:  Bethel Born, CCC-SLP, time ***/Date ***   Assessment/Plan: Diagnosis: Does the need for close, 24 hr/day Medical supervision in concert with the patient's rehab needs make it unreasonable for this patient to be served in a less intensive setting? {yes_no_potentially:3041433} Co-Morbidities requiring supervision/potential complications: *** Due to {due PV:3748270}, does the patient require 24 hr/day rehab nursing? {yes_no_potentially:3041433} Does the patient require coordinated care of a physician, rehab nurse, PT, OT, and SLP to address physical and functional deficits in the context of the above medical diagnosis(es)? {yes_no_potentially:3041433} Addressing deficits in the following areas: {deficits:3041436} Can the patient actively participate in an intensive therapy program of at least 3 hrs of therapy 5 days a week? {yes_no_potentially:3041433} The potential for patient to make measurable gains while on inpatient rehab is {potential:3041437} Anticipated functional outcomes upon discharge from inpatient rehab: {functional outcomes:304600100} PT, {functional outcomes:304600100} OT, {functional outcomes:304600100} SLP Estimated rehab length of stay to reach the above functional goals is: *** Anticipated discharge destination: {anticipated dc setting:21604} 10. Overall Rehab/Functional Prognosis: {potential:3041437}   MD Signature: ***

## 2022-01-05 NOTE — Plan of Care (Signed)
  Problem: Pain Managment: Goal: General experience of comfort will improve Outcome: Progressing   Problem: Safety: Goal: Ability to remain free from injury will improve Outcome: Progressing   Problem: Skin Integrity: Goal: Risk for impaired skin integrity will decrease Outcome: Progressing   

## 2022-01-05 NOTE — Progress Notes (Signed)
Pt returned to 5N15 via bed after dialysis. VSS.

## 2022-01-06 ENCOUNTER — Other Ambulatory Visit: Payer: Self-pay

## 2022-01-06 ENCOUNTER — Inpatient Hospital Stay (HOSPITAL_COMMUNITY)
Admission: RE | Admit: 2022-01-06 | Discharge: 2022-01-17 | DRG: 559 | Disposition: A | Discharge status: Other Acute Inpatient Hospital | Payer: Medicare Other | Source: Intra-hospital | Attending: Physical Medicine and Rehabilitation | Admitting: Physical Medicine and Rehabilitation

## 2022-01-06 ENCOUNTER — Encounter (HOSPITAL_COMMUNITY): Payer: Self-pay | Admitting: Physical Medicine and Rehabilitation

## 2022-01-06 DIAGNOSIS — E559 Vitamin D deficiency, unspecified: Secondary | ICD-10-CM | POA: Diagnosis present

## 2022-01-06 DIAGNOSIS — Z794 Long term (current) use of insulin: Secondary | ICD-10-CM

## 2022-01-06 DIAGNOSIS — K219 Gastro-esophageal reflux disease without esophagitis: Secondary | ICD-10-CM | POA: Diagnosis present

## 2022-01-06 DIAGNOSIS — E1169 Type 2 diabetes mellitus with other specified complication: Secondary | ICD-10-CM | POA: Diagnosis present

## 2022-01-06 DIAGNOSIS — Z6838 Body mass index (BMI) 38.0-38.9, adult: Secondary | ICD-10-CM

## 2022-01-06 DIAGNOSIS — Z89511 Acquired absence of right leg below knee: Secondary | ICD-10-CM

## 2022-01-06 DIAGNOSIS — Z807 Family history of other malignant neoplasms of lymphoid, hematopoietic and related tissues: Secondary | ICD-10-CM

## 2022-01-06 DIAGNOSIS — N400 Enlarged prostate without lower urinary tract symptoms: Secondary | ICD-10-CM | POA: Diagnosis present

## 2022-01-06 DIAGNOSIS — E1122 Type 2 diabetes mellitus with diabetic chronic kidney disease: Secondary | ICD-10-CM | POA: Diagnosis present

## 2022-01-06 DIAGNOSIS — J9601 Acute respiratory failure with hypoxia: Secondary | ICD-10-CM | POA: Diagnosis not present

## 2022-01-06 DIAGNOSIS — F324 Major depressive disorder, single episode, in partial remission: Secondary | ICD-10-CM | POA: Diagnosis not present

## 2022-01-06 DIAGNOSIS — D631 Anemia in chronic kidney disease: Secondary | ICD-10-CM | POA: Diagnosis present

## 2022-01-06 DIAGNOSIS — G4700 Insomnia, unspecified: Secondary | ICD-10-CM | POA: Diagnosis present

## 2022-01-06 DIAGNOSIS — Z8673 Personal history of transient ischemic attack (TIA), and cerebral infarction without residual deficits: Secondary | ICD-10-CM

## 2022-01-06 DIAGNOSIS — G546 Phantom limb syndrome with pain: Secondary | ICD-10-CM | POA: Diagnosis present

## 2022-01-06 DIAGNOSIS — Z4781 Encounter for orthopedic aftercare following surgical amputation: Principal | ICD-10-CM

## 2022-01-06 DIAGNOSIS — B1920 Unspecified viral hepatitis C without hepatic coma: Secondary | ICD-10-CM | POA: Diagnosis present

## 2022-01-06 DIAGNOSIS — T148XXA Other injury of unspecified body region, initial encounter: Secondary | ICD-10-CM | POA: Diagnosis not present

## 2022-01-06 DIAGNOSIS — Z833 Family history of diabetes mellitus: Secondary | ICD-10-CM

## 2022-01-06 DIAGNOSIS — G8918 Other acute postprocedural pain: Secondary | ICD-10-CM | POA: Diagnosis not present

## 2022-01-06 DIAGNOSIS — Z992 Dependence on renal dialysis: Secondary | ICD-10-CM

## 2022-01-06 DIAGNOSIS — E669 Obesity, unspecified: Secondary | ICD-10-CM | POA: Diagnosis present

## 2022-01-06 DIAGNOSIS — Z7902 Long term (current) use of antithrombotics/antiplatelets: Secondary | ICD-10-CM | POA: Diagnosis not present

## 2022-01-06 DIAGNOSIS — N186 End stage renal disease: Secondary | ICD-10-CM | POA: Diagnosis present

## 2022-01-06 DIAGNOSIS — N2581 Secondary hyperparathyroidism of renal origin: Secondary | ICD-10-CM | POA: Diagnosis present

## 2022-01-06 DIAGNOSIS — F32A Depression, unspecified: Secondary | ICD-10-CM | POA: Diagnosis present

## 2022-01-06 DIAGNOSIS — I472 Ventricular tachycardia, unspecified: Secondary | ICD-10-CM | POA: Diagnosis not present

## 2022-01-06 DIAGNOSIS — R5381 Other malaise: Secondary | ICD-10-CM | POA: Diagnosis present

## 2022-01-06 DIAGNOSIS — I10 Essential (primary) hypertension: Secondary | ICD-10-CM | POA: Diagnosis not present

## 2022-01-06 DIAGNOSIS — M86171 Other acute osteomyelitis, right ankle and foot: Secondary | ICD-10-CM | POA: Diagnosis not present

## 2022-01-06 DIAGNOSIS — E11649 Type 2 diabetes mellitus with hypoglycemia without coma: Secondary | ICD-10-CM | POA: Diagnosis not present

## 2022-01-06 DIAGNOSIS — J9602 Acute respiratory failure with hypercapnia: Secondary | ICD-10-CM | POA: Diagnosis not present

## 2022-01-06 DIAGNOSIS — Z89512 Acquired absence of left leg below knee: Secondary | ICD-10-CM

## 2022-01-06 DIAGNOSIS — Z7982 Long term (current) use of aspirin: Secondary | ICD-10-CM

## 2022-01-06 DIAGNOSIS — I469 Cardiac arrest, cause unspecified: Secondary | ICD-10-CM | POA: Diagnosis not present

## 2022-01-06 DIAGNOSIS — E1165 Type 2 diabetes mellitus with hyperglycemia: Secondary | ICD-10-CM | POA: Diagnosis present

## 2022-01-06 DIAGNOSIS — Z5320 Procedure and treatment not carried out because of patient's decision for unspecified reasons: Secondary | ICD-10-CM | POA: Diagnosis not present

## 2022-01-06 DIAGNOSIS — Z87891 Personal history of nicotine dependence: Secondary | ICD-10-CM | POA: Diagnosis not present

## 2022-01-06 DIAGNOSIS — G894 Chronic pain syndrome: Secondary | ICD-10-CM | POA: Diagnosis not present

## 2022-01-06 DIAGNOSIS — E785 Hyperlipidemia, unspecified: Secondary | ICD-10-CM | POA: Diagnosis present

## 2022-01-06 DIAGNOSIS — I9589 Other hypotension: Secondary | ICD-10-CM | POA: Diagnosis present

## 2022-01-06 DIAGNOSIS — E1151 Type 2 diabetes mellitus with diabetic peripheral angiopathy without gangrene: Secondary | ICD-10-CM | POA: Diagnosis present

## 2022-01-06 DIAGNOSIS — S88111A Complete traumatic amputation at level between knee and ankle, right lower leg, initial encounter: Secondary | ICD-10-CM | POA: Diagnosis not present

## 2022-01-06 DIAGNOSIS — Z79899 Other long term (current) drug therapy: Secondary | ICD-10-CM

## 2022-01-06 DIAGNOSIS — K920 Hematemesis: Secondary | ICD-10-CM | POA: Diagnosis not present

## 2022-01-06 DIAGNOSIS — I120 Hypertensive chronic kidney disease with stage 5 chronic kidney disease or end stage renal disease: Secondary | ICD-10-CM | POA: Diagnosis present

## 2022-01-06 DIAGNOSIS — E875 Hyperkalemia: Secondary | ICD-10-CM | POA: Diagnosis not present

## 2022-01-06 DIAGNOSIS — I739 Peripheral vascular disease, unspecified: Secondary | ICD-10-CM | POA: Diagnosis not present

## 2022-01-06 DIAGNOSIS — E113599 Type 2 diabetes mellitus with proliferative diabetic retinopathy without macular edema, unspecified eye: Secondary | ICD-10-CM | POA: Diagnosis present

## 2022-01-06 DIAGNOSIS — R400 Somnolence: Secondary | ICD-10-CM | POA: Diagnosis not present

## 2022-01-06 DIAGNOSIS — R042 Hemoptysis: Secondary | ICD-10-CM | POA: Diagnosis not present

## 2022-01-06 LAB — GLUCOSE, CAPILLARY
Glucose-Capillary: 126 mg/dL — ABNORMAL HIGH (ref 70–99)
Glucose-Capillary: 125 mg/dL — ABNORMAL HIGH (ref 70–99)
Glucose-Capillary: 134 mg/dL — ABNORMAL HIGH (ref 70–99)
Glucose-Capillary: 135 mg/dL — ABNORMAL HIGH (ref 70–99)

## 2022-01-06 MED ORDER — TRAZODONE HCL 50 MG PO TABS
25.0000 mg | ORAL_TABLET | Freq: Every evening | ORAL | Status: DC | PRN
  Administered 2022-01-06 – 2022-01-12 (×4): 50 mg via ORAL
  Filled 2022-01-06 (×5): qty 1

## 2022-01-06 MED ORDER — DIPHENHYDRAMINE HCL 12.5 MG/5ML PO ELIX: 12.5000 mg | ORAL_SOLUTION | Freq: Four times a day (QID) | ORAL | Status: DC | PRN

## 2022-01-06 MED ORDER — HYDROMORPHONE HCL 2 MG PO TABS
4.0000 mg | ORAL_TABLET | Freq: Three times a day (TID) | ORAL | Status: DC | PRN
  Administered 2022-01-06 – 2022-01-07 (×2): 4 mg via ORAL
  Filled 2022-01-06 (×2): qty 2

## 2022-01-06 MED ORDER — ALBUTEROL SULFATE (2.5 MG/3ML) 0.083% IN NEBU: 3.0000 mL | INHALATION_SOLUTION | Freq: Four times a day (QID) | RESPIRATORY_TRACT | Status: DC | PRN

## 2022-01-06 MED ORDER — DULOXETINE HCL 20 MG PO CPEP
20.0000 mg | ORAL_CAPSULE | Freq: Two times a day (BID) | ORAL | Status: DC
  Administered 2022-01-06 – 2022-01-08 (×5): 20 mg via ORAL
  Filled 2022-01-06 (×6): qty 1

## 2022-01-06 MED ORDER — METHADONE HCL 5 MG PO TABS
2.5000 mg | ORAL_TABLET | Freq: Two times a day (BID) | ORAL | Status: DC
  Administered 2022-01-06 – 2022-01-08 (×4): 2.5 mg via ORAL
  Filled 2022-01-06 (×4): qty 1

## 2022-01-06 MED ORDER — BISACODYL 5 MG PO TBEC: 5.0000 mg | DELAYED_RELEASE_TABLET | Freq: Every day | ORAL | 0 refills | Status: AC | PRN

## 2022-01-06 MED ORDER — UREA 10 % EX LOTN
TOPICAL_LOTION | Freq: Two times a day (BID) | CUTANEOUS | Status: DC
Start: 2022-01-06 — End: 2022-01-17
  Filled 2022-01-06: qty 237

## 2022-01-06 MED ORDER — BISACODYL 10 MG RE SUPP: 10.0000 mg | Freq: Every day | RECTAL | Status: DC | PRN

## 2022-01-06 MED ORDER — DOCUSATE SODIUM 100 MG PO CAPS: 100.0000 mg | ORAL_CAPSULE | Freq: Every day | ORAL | 0 refills | Status: AC

## 2022-01-06 MED ORDER — MILK AND MOLASSES ENEMA: 1.0000 | Freq: Every day | RECTAL | Status: DC | PRN

## 2022-01-06 MED ORDER — MIDODRINE HCL 5 MG PO TABS
10.0000 mg | ORAL_TABLET | ORAL | Status: DC
  Administered 2022-01-07 – 2022-01-16 (×5): 10 mg via ORAL
  Filled 2022-01-06 (×6): qty 2

## 2022-01-06 MED ORDER — CALCIUM CARBONATE ANTACID 500 MG PO CHEW
400.0000 mg | CHEWABLE_TABLET | Freq: Three times a day (TID) | ORAL | Status: DC | PRN
Start: 2022-01-06 — End: 2022-01-10

## 2022-01-06 MED ORDER — IPRATROPIUM-ALBUTEROL 0.5-2.5 (3) MG/3ML IN SOLN: 3.0000 mL | Freq: Four times a day (QID) | RESPIRATORY_TRACT | Status: DC | PRN

## 2022-01-06 MED ORDER — ACETAMINOPHEN 325 MG PO TABS
325.0000 mg | ORAL_TABLET | ORAL | Status: DC | PRN
  Administered 2022-01-06 – 2022-01-15 (×2): 650 mg via ORAL
  Filled 2022-01-06 (×2): qty 2

## 2022-01-06 MED ORDER — RENA-VITE PO TABS
1.0000 | ORAL_TABLET | Freq: Every day | ORAL | Status: DC
  Administered 2022-01-06 – 2022-01-16 (×11): 1 via ORAL
  Filled 2022-01-06 (×11): qty 1

## 2022-01-06 MED ORDER — CLOPIDOGREL BISULFATE 75 MG PO TABS
75.0000 mg | ORAL_TABLET | Freq: Every day | ORAL | Status: DC
Start: 2022-01-07 — End: 2022-01-17
  Administered 2022-01-07 – 2022-01-16 (×10): 75 mg via ORAL
  Filled 2022-01-06 (×11): qty 1

## 2022-01-06 MED ORDER — RENA-VITE PO TABS: 1.0000 | ORAL_TABLET | Freq: Every day | ORAL | 0 refills | Status: DC

## 2022-01-06 MED ORDER — INSULIN ASPART 100 UNIT/ML IJ SOLN
2.0000 [IU] | Freq: Three times a day (TID) | INTRAMUSCULAR | Status: DC
Start: 2022-01-07 — End: 2022-01-09
  Administered 2022-01-08: 2 [IU] via SUBCUTANEOUS

## 2022-01-06 MED ORDER — ZINC SULFATE 220 (50 ZN) MG PO CAPS: 220.0000 mg | ORAL_CAPSULE | Freq: Every day | ORAL | Status: DC

## 2022-01-06 MED ORDER — ZINC SULFATE 220 (50 ZN) MG PO CAPS
220.0000 mg | ORAL_CAPSULE | Freq: Every day | ORAL | Status: AC
  Administered 2022-01-07 – 2022-01-14 (×8): 220 mg via ORAL
  Filled 2022-01-06 (×9): qty 1

## 2022-01-06 MED ORDER — BROMOCRIPTINE MESYLATE 2.5 MG PO TABS
2.5000 mg | ORAL_TABLET | Freq: Every day | ORAL | Status: DC
  Administered 2022-01-06 – 2022-01-16 (×11): 2.5 mg via ORAL
  Filled 2022-01-06 (×12): qty 1

## 2022-01-06 MED ORDER — PHENOL 1.4 % MT LIQD: 1.0000 | OROMUCOSAL | Status: DC | PRN

## 2022-01-06 MED ORDER — POLYETHYLENE GLYCOL 3350 17 G PO PACK: 17.0000 g | PACK | Freq: Every day | ORAL | Status: DC | PRN

## 2022-01-06 MED ORDER — ENOXAPARIN SODIUM 30 MG/0.3ML IJ SOSY
30.0000 mg | PREFILLED_SYRINGE | INTRAMUSCULAR | Status: DC
  Administered 2022-01-06 – 2022-01-16 (×10): 30 mg via SUBCUTANEOUS
  Filled 2022-01-06 (×10): qty 0.3

## 2022-01-06 MED ORDER — HYDROXYZINE HCL 10 MG PO TABS
10.0000 mg | ORAL_TABLET | Freq: Three times a day (TID) | ORAL | Status: DC | PRN
Start: 2022-01-06 — End: 2022-01-17

## 2022-01-06 MED ORDER — JUVEN PO PACK
1.0000 | PACK | Freq: Two times a day (BID) | ORAL | Status: DC
  Administered 2022-01-07 – 2022-01-16 (×8): 1 via ORAL
  Filled 2022-01-06 (×12): qty 1

## 2022-01-06 MED ORDER — ASPIRIN 81 MG PO TBEC
81.0000 mg | DELAYED_RELEASE_TABLET | Freq: Every day | ORAL | Status: DC
  Administered 2022-01-07 – 2022-01-16 (×10): 81 mg via ORAL
  Filled 2022-01-06 (×11): qty 1

## 2022-01-06 MED ORDER — PROCHLORPERAZINE EDISYLATE 10 MG/2ML IJ SOLN: 5.0000 mg | Freq: Four times a day (QID) | INTRAMUSCULAR | Status: DC | PRN

## 2022-01-06 MED ORDER — PANTOPRAZOLE SODIUM 40 MG PO TBEC
40.0000 mg | DELAYED_RELEASE_TABLET | Freq: Every day | ORAL | Status: DC
  Administered 2022-01-07 – 2022-01-16 (×10): 40 mg via ORAL
  Filled 2022-01-06 (×11): qty 1

## 2022-01-06 MED ORDER — INSULIN ASPART 100 UNIT/ML IJ SOLN: 0.0000 [IU] | Freq: Three times a day (TID) | INTRAMUSCULAR | Status: DC

## 2022-01-06 MED ORDER — INSULIN ASPART 100 UNIT/ML IJ SOLN: INTRAMUSCULAR | 11 refills | Status: DC

## 2022-01-06 MED ORDER — ASCORBIC ACID 500 MG PO TABS
1000.0000 mg | ORAL_TABLET | Freq: Every day | ORAL | Status: DC
  Administered 2022-01-07 – 2022-01-16 (×10): 1000 mg via ORAL
  Filled 2022-01-06 (×12): qty 2

## 2022-01-06 MED ORDER — INSULIN DETEMIR 100 UNIT/ML ~~LOC~~ SOLN
5.0000 [IU] | Freq: Every day | SUBCUTANEOUS | Status: DC
  Administered 2022-01-07: 5 [IU] via SUBCUTANEOUS
  Filled 2022-01-06: qty 0.05

## 2022-01-06 MED ORDER — JUVEN PO PACK: 1.0000 | PACK | Freq: Two times a day (BID) | ORAL | 0 refills | Status: DC

## 2022-01-06 MED ORDER — ATORVASTATIN CALCIUM 40 MG PO TABS
40.0000 mg | ORAL_TABLET | Freq: Every day | ORAL | Status: DC
  Administered 2022-01-06 – 2022-01-16 (×11): 40 mg via ORAL
  Filled 2022-01-06 (×11): qty 1

## 2022-01-06 MED ORDER — FERRIC CITRATE 1 GM 210 MG(FE) PO TABS
630.0000 mg | ORAL_TABLET | Freq: Three times a day (TID) | ORAL | Status: DC
  Administered 2022-01-07 – 2022-01-16 (×27): 630 mg via ORAL
  Filled 2022-01-06 (×33): qty 3

## 2022-01-06 MED ORDER — GUAIFENESIN-DM 100-10 MG/5ML PO SYRP: 15.0000 mL | ORAL_SOLUTION | ORAL | Status: DC | PRN

## 2022-01-06 MED ORDER — OXYCODONE HCL 10 MG PO TABS: 10.0000 mg | ORAL_TABLET | ORAL | 0 refills | Status: DC | PRN

## 2022-01-06 MED ORDER — POLYETHYLENE GLYCOL 3350 17 G PO PACK: 17.0000 g | PACK | Freq: Every day | ORAL | 0 refills | Status: AC | PRN

## 2022-01-06 MED ORDER — MELATONIN 5 MG PO TABS
10.0000 mg | ORAL_TABLET | Freq: Every day | ORAL | Status: DC
  Administered 2022-01-06 – 2022-01-16 (×11): 10 mg via ORAL
  Filled 2022-01-06 (×11): qty 2

## 2022-01-06 MED ORDER — SODIUM THIOSULFATE 250 MG/ML IV SOLN: 25.0000 g | INTRAVENOUS | Status: AC

## 2022-01-06 MED ORDER — BUPROPION HCL ER (XL) 300 MG PO TB24
300.0000 mg | ORAL_TABLET | Freq: Every morning | ORAL | Status: DC
  Administered 2022-01-07 – 2022-01-17 (×11): 300 mg via ORAL
  Filled 2022-01-06 (×11): qty 1

## 2022-01-06 MED ORDER — PROCHLORPERAZINE MALEATE 5 MG PO TABS
5.0000 mg | ORAL_TABLET | Freq: Four times a day (QID) | ORAL | Status: DC | PRN
  Administered 2022-01-11: 10 mg via ORAL
  Filled 2022-01-06: qty 2

## 2022-01-06 MED ORDER — PROCHLORPERAZINE 25 MG RE SUPP: 12.5000 mg | Freq: Four times a day (QID) | RECTAL | Status: DC | PRN

## 2022-01-06 MED ORDER — UREA 10 % EX LOTN: TOPICAL_LOTION | Freq: Two times a day (BID) | CUTANEOUS | 0 refills | Status: AC

## 2022-01-06 MED ORDER — SODIUM THIOSULFATE 250 MG/ML IV SOLN
25.0000 g | INTRAVENOUS | Status: DC
Start: 2022-01-07 — End: 2022-01-17
  Administered 2022-01-07 – 2022-01-14 (×4): 25 g via INTRAVENOUS
  Filled 2022-01-06 (×7): qty 100

## 2022-01-06 MED ORDER — TAMSULOSIN HCL 0.4 MG PO CAPS
0.4000 mg | ORAL_CAPSULE | Freq: Every day | ORAL | Status: DC
  Administered 2022-01-06 – 2022-01-16 (×11): 0.4 mg via ORAL
  Filled 2022-01-06 (×11): qty 1

## 2022-01-06 MED ORDER — METOPROLOL TARTRATE 25 MG PO TABS
25.0000 mg | ORAL_TABLET | Freq: Two times a day (BID) | ORAL | Status: DC
  Administered 2022-01-06 – 2022-01-13 (×14): 25 mg via ORAL
  Filled 2022-01-06 (×16): qty 1

## 2022-01-06 NOTE — Progress Notes (Signed)
Inpatient Rehabilitation Admission Medication Review by a Pharmacist  A complete drug regimen review was completed for this patient to identify any potential clinically significant medication issues.  High Risk Drug Classes Is patient taking? Indication by Medication  Antipsychotic Yes Compazine- N/V  Anticoagulant Yes Heparin- VTE prophylaxis  Antibiotic No   Opioid Yes Dilaudid, OxyIR- acute pain  Antiplatelet Yes Plavix, aspirin- CVA prophylaxis  Hypoglycemics/insulin Yes Insulin- T2DM  Vasoactive Medication Yes Flomax- BPH Lopressor- hypertension Midodrine- orthostatic hypotension  Chemotherapy No   Other Yes Lipitor- HLD Protonix- GERD Wellbutrin- MDD Duloxetine- MDD Melatonin- sleep Auryxia- hyperphosphatemia 2/2 ESRD HD dependant     Type of Medication Issue Identified Description of Issue Recommendation(s)  Drug Interaction(s) (clinically significant)     Duplicate Therapy     Allergy     No Medication Administration End Date     Incorrect Dose     Additional Drug Therapy Needed     Significant med changes from prior encounter (inform family/care partners about these prior to discharge).    Other  PTA meds: Rocaltrol 0.5 mcg 4 caps qmwf w/ HD Restart PTA meds when and if necessary during CIR admission or at time of discharge, if warranted    Clinically significant medication issues were identified that warrant physician communication and completion of prescribed/recommended actions by midnight of the next day:  No   Time spent performing this drug regimen review (minutes):  30   Earle Reome BS, PharmD, BCPS Clinical Pharmacist 01/06/2022 12:05 PM  Contact: [phone] after 3 PM  "Be curious, not judgmental..." -Jamal Maes

## 2022-01-06 NOTE — Discharge Summary (Signed)
Physician Discharge Summary   Patient: Michael Riggs MRN: [id number] DOB: [birth year]  Admit date:     12/25/2021  Discharge date: 01/06/22  Discharge Physician: Hosie Poisson   PCP: [name], MD   Recommendations at discharge:  Please follow up with Dr Sharol Given as recommended.  Please check cbc and bmp in one week.   Discharge Diagnoses: Principal Problem:   Osteomyelitis of foot, right, acute ([place]) Active Problems:   ESRD (end stage renal disease) ([place])   Essential hypertension   Wound infection   Hypertension   Osteomyelitis [facility])   Hospital Course:  Michael Riggs is an 51 y.o. 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 Sharol Given as an outpatient and told to to the office on 12/26/2031 Returned to the emergency room found to have a breakdown of his calcaneus posteriorly found to have osteomyelitis in the right calcaneus area was started on antibiotics and orthopedic surgery was consulted.   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   Patient is back on aspirin 81 mg, Plavix 75 mg and Lipitor 40 mg daily Pain control with oxycodone and oral dilaudid.  Therapy evaluations recommending CIR.     End-stage renal disease on dialysis Monday Wednesday and Friday. Nephrology on board and further recommendations to follow.     Transient hypotension Resolved  continue with midodrine prior to HD       Type 2 DM  Last A1c is 6.2% in March 2023.  Recommend SSI  while inpatient.         Essential hypertension On metoprolol 25 mg twice daily     Diabetic peripheral neuropathy  resume home meds.      BPH Continue with Flomax   Calciphylaxis: -start sodium thiosulfate on the days of HD and stop the calcitriol as per renal recommendations     Estimated body mass index is 32.68 kg/m as calculated from the following:   Height as of this encounter: 5\' 10"  (1.778 m).   Weight as of this encounter: 103.3 kg.     Consultants: nephrology Orthopedics.  Procedures performed: right below-knee amputation also status post left below-knee amputation with ischemic ulcers on the residual limb on the left  Disposition: Rehabilitation facility Diet recommendation:  Discharge Diet Orders (From admission, onward)     Start     Ordered   01/06/22 0000  Diet - low sodium heart healthy        01/06/22 1037           Renal diet DISCHARGE MEDICATION: Allergies as of 01/06/2022       Reactions   Tape Itching   Prefers silk tape over regular tape        Medication List     STOP taking these medications    amLODipine 5 MG tablet Commonly known as: NORVASC   calcitRIOL 0.5 MCG capsule Commonly known as: ROCALTROL       TAKE these medications    acetaminophen 500 MG tablet Commonly known as: TYLENOL Take 1,000 mg by mouth every 6 (six) hours  as needed for mild pain.   albuterol 108 (90 Base) MCG/ACT inhaler Commonly known as: VENTOLIN HFA Inhale 2 puffs into the lungs every 6 (six) hours as needed for wheezing or shortness of breath.   Aspirin Low Dose 81 MG tablet Generic drug: aspirin EC Take 1 tablet (81 mg total) by mouth daily. Swallow whole.   atorvastatin 40 MG tablet Commonly known as: LIPITOR Take 1 tablet (40 mg total) by mouth daily. What changed: when to take this   bisacodyl 5 MG EC tablet Commonly known as: DULCOLAX Take 1 tablet (5 mg total) by mouth daily as needed for moderate constipation.    bromocriptine 2.5 MG tablet Commonly known as: PARLODEL TAKE 1 TABLET BY MOUTH AT BEDTIME.   buPROPion 300 MG 24 hr tablet Commonly known as: WELLBUTRIN XL Take 1 tablet (300 mg total) by mouth in the morning.   clopidogrel 75 MG tablet Commonly known as: PLAVIX Take 1 tablet (75 mg total) by mouth daily with breakfast.   docusate sodium 100 MG capsule Commonly known as: COLACE Take 1 capsule (100 mg total) by mouth daily.   DULoxetine 20 MG capsule Commonly known as: CYMBALTA Take 20 mg by mouth 2 (two) times daily.   ferric citrate 1 GM 210 MG(Fe) tablet Commonly known as: AURYXIA Take 630 mg by mouth 3 (three) times daily with meals.   HYDROmorphone 4 MG tablet Commonly known as: DILAUDID Take 4 mg by mouth every 8 (eight) hours as needed for moderate pain.   insulin aspart 100 UNIT/ML injection Commonly known as: novoLOG CBG 70 - 120: 0 units  CBG 121 - 150: 0 units  CBG 151 - 200: 1 unit  CBG 201-250: 2 units  CBG 251-300: 3 units  CBG 301-350: 4 units  CBG 351-400: 5 units   ipratropium-albuterol 0.5-2.5 (3) MG/3ML Soln Commonly known as: DUONEB Take 3 mLs by nebulization every 6 (six) hours as needed (For shortness of breath).   Melatonin 10 MG Tabs Take 10 mg by mouth at bedtime.   metoprolol tartrate 25 MG tablet Commonly known as: LOPRESSOR Take 1 tablet (25 mg total) by mouth 2 (two) times daily.   midodrine 10 MG tablet Commonly known as: PROAMATINE Take 10 mg by mouth every Monday, Wednesday, and Friday.   multivitamin Tabs tablet Take 1 tablet by mouth at bedtime.   nutrition supplement (JUVEN) Pack Take 1 packet by mouth 2 (two) times daily between meals.   ondansetron 4 MG tablet Commonly known as: ZOFRAN Take 4 mg by mouth daily as needed for vomiting.   Oxycodone HCl 10 MG Tabs Take 1-1.5 tablets (10-15 mg total) by mouth every 4 (four) hours as needed for up to 3 days for severe pain (pain score 7-10).   pantoprazole 40 MG  tablet Commonly known as: PROTONIX Take 1 tablet (40 mg total) by mouth daily. What changed: when to take this   polyethylene glycol 17 g packet Commonly known as: MIRALAX / GLYCOLAX Take 17 g by mouth daily as needed for mild constipation.   silver sulfADIAZINE 1 % cream Commonly known as: SILVADENE Apply 1 application. topically daily. Apply to affected area daily plus dry dressing   sodium chloride 0.9 % SOLN 100 mL with sodium thiosulfate 250 MG/ML SOLN 25 g Inject 25 g into the vein every Monday, Wednesday, and Friday with hemodialysis. Start taking on: January 07, 2022   tamsulosin 0.4 MG Caps capsule Commonly known as: FLOMAX Take 1 capsule (0.4 mg total)  by mouth daily after breakfast. What changed: when to take this   urea 10 % lotion Apply topically 2 (two) times daily.   vitamin C 500 MG tablet Commonly known as: ASCORBIC ACID Take 500 mg by mouth at bedtime.   zinc sulfate 220 (50 Zn) MG capsule Take 1 capsule (220 mg total) by mouth daily.               Discharge Care Instructions  (From admission, onward)           Start     Ordered   01/06/22 0000  Discharge wound care:       Comments: Wound vac orders and further wound care as per orthopedics.   01/06/22 1037            Follow-up Information     [name], MD Follow up in 1 week(s).   Specialty: Orthopedic Surgery Contact information: [place] [address] [phone]                Discharge Exam: Danley Danker Weights   01/02/22 2054 01/05/22 0810 01/05/22 0818  Weight: 101.7 kg 103.3 kg 103.3 kg   General exam: Appears calm and comfortable  Respiratory system: Clear to auscultation. Respiratory effort normal. Cardiovascular system: S1 & S2 heard, RRR. No JVD, No pedal edema. Gastrointestinal system: Abdomen is nondistended, soft and nontender. Normal bowel sounds heard. Central nervous system: Alert and oriented. No focal neurological  deficits. Extremities:  Skin: No rashes, lesions or ulcers Psychiatry: Mood & affect appropriate.    Condition at discharge: fair  The results of significant diagnostics from this hospitalization (including imaging, microbiology, ancillary and laboratory) are listed below for reference.   Imaging Studies: DG Ankle Complete Right  Result Date: 12/25/2021 CLINICAL DATA:  Soft tissue wound along the plantar aspect of the right foot with decreased healing, initial encounter EXAM: RIGHT ANKLE - COMPLETE 3+ VIEW COMPARISON:  10/06/2021 FINDINGS: Previously seen lucency within the distal tibia is no longer identified. Soft tissue wound is noted along the superior aspect of the calcaneus near the Achilles insertion. This is new from the prior exam. There are rows of changes in this region consistent with osteomyelitis. Tarsal degenerative changes are noted. IMPRESSION: New nonhealing wound along the posterior aspect of the ankle with erosive changes along the posterosuperior calcaneus consistent with osteomyelitis. Electronically Signed   By: Inez Catalina M.D.   On: 12/25/2021 19:38    Microbiology: Results for orders placed or performed during the hospital encounter of 12/25/21  Blood culture (routine x 2)     Status: None   Collection Time: 12/25/21  7:03 PM   Specimen: BLOOD  Result Value Ref Range Status   Specimen Description BLOOD RIGHT ANTECUBITAL  Final   Special Requests   Final    BOTTLES DRAWN AEROBIC AND ANAEROBIC Blood Culture adequate volume   Culture   Final    NO GROWTH 5 DAYS Performed at [facility], [place] [address] [place], [place] [address]    Report Status 12/30/2021 FINAL  Final  Blood culture (routine x 2)     Status: Abnormal   Collection Time: 12/26/21 12:40 AM   Specimen: BLOOD RIGHT FOREARM  Result Value Ref Range Status   Specimen Description BLOOD RIGHT FOREARM  Final   Special Requests   Final    BOTTLES DRAWN AEROBIC AND ANAEROBIC Blood Culture adequate  volume   Culture  Setup Time   Final  GRAM NEGATIVE RODS BOTTLES DRAWN AEROBIC ONLY CRITICAL RESULT CALLED TO, READ BACK BY AND VERIFIED WITH: PHARMD [place] ON 12/26/21 @ 2027 BY DRT Performed at [facility], [place] [address], [place], [place] [address]    Culture [facility] MORGANII (A)  Final   Report Status 12/28/2021 FINAL  Final   Organism ID, Bacteria MORGANELLA MORGANII  Final      Susceptibility   Morganella morganii - MIC*    AMPICILLIN >=32 RESISTANT Resistant     CEFAZOLIN >=64 RESISTANT Resistant     CEFTAZIDIME 2 SENSITIVE Sensitive     CIPROFLOXACIN <=0.25 SENSITIVE Sensitive     GENTAMICIN <=1 SENSITIVE Sensitive     IMIPENEM 2 SENSITIVE Sensitive     TRIMETH/SULFA <=20 SENSITIVE Sensitive     AMPICILLIN/SULBACTAM >=32 RESISTANT Resistant     PIP/TAZO <=4 SENSITIVE Sensitive     * MORGANELLA MORGANII  Blood Culture ID Panel (Reflexed)     Status: Abnormal   Collection Time: 12/26/21 12:40 AM  Result Value Ref Range Status   Enterococcus faecalis NOT DETECTED NOT DETECTED Final   Enterococcus Faecium NOT DETECTED NOT DETECTED Final   Listeria monocytogenes NOT DETECTED NOT DETECTED Final   Staphylococcus species NOT DETECTED NOT DETECTED Final   Staphylococcus aureus (BCID) NOT DETECTED NOT DETECTED Final   Staphylococcus epidermidis NOT DETECTED NOT DETECTED Final   Staphylococcus lugdunensis NOT DETECTED NOT DETECTED Final   Streptococcus species NOT DETECTED NOT DETECTED Final   Streptococcus agalactiae NOT DETECTED NOT DETECTED Final   Streptococcus pneumoniae NOT DETECTED NOT DETECTED Final   Streptococcus pyogenes NOT DETECTED NOT DETECTED Final   A.calcoaceticus-baumannii NOT DETECTED NOT DETECTED Final   Bacteroides fragilis NOT DETECTED NOT DETECTED Final   Enterobacterales DETECTED (A) NOT DETECTED Final    Comment: Enterobacterales represent a large order of gram negative bacteria, not a single organism. Refer to culture for further  identification. CRITICAL RESULT CALLED TO, READ BACK BY AND VERIFIED WITH: PHARMD CARLA JARDIN ON 12/26/21 @ 2027 BY DRT    Enterobacter cloacae complex NOT DETECTED NOT DETECTED Final   Escherichia coli NOT DETECTED NOT DETECTED Final   Klebsiella aerogenes NOT DETECTED NOT DETECTED Final   Klebsiella oxytoca NOT DETECTED NOT DETECTED Final   Klebsiella pneumoniae NOT DETECTED NOT DETECTED Final   Proteus species NOT DETECTED NOT DETECTED Final   Salmonella species NOT DETECTED NOT DETECTED Final   Serratia marcescens NOT DETECTED NOT DETECTED Final   Haemophilus influenzae NOT DETECTED NOT DETECTED Final   Neisseria meningitidis NOT DETECTED NOT DETECTED Final   Pseudomonas aeruginosa NOT DETECTED NOT DETECTED Final   Stenotrophomonas maltophilia NOT DETECTED NOT DETECTED Final   Candida albicans NOT DETECTED NOT DETECTED Final   Candida auris NOT DETECTED NOT DETECTED Final   Candida glabrata NOT DETECTED NOT DETECTED Final   Candida krusei NOT DETECTED NOT DETECTED Final   Candida parapsilosis NOT DETECTED NOT DETECTED Final   Candida tropicalis NOT DETECTED NOT DETECTED Final   Cryptococcus neoformans/gattii NOT DETECTED NOT DETECTED Final   CTX-M ESBL NOT DETECTED NOT DETECTED Final   Carbapenem resistance IMP NOT DETECTED NOT DETECTED Final   Carbapenem resistance KPC NOT DETECTED NOT DETECTED Final   Carbapenem resistance NDM NOT DETECTED NOT DETECTED Final   Carbapenem resist OXA 48 LIKE NOT DETECTED NOT DETECTED Final   Carbapenem resistance VIM NOT DETECTED NOT DETECTED Final    Comment: Performed at [facility], [address], [place],  [address]  Surgical pcr screen     Status: None   Collection Time: 12/30/21  3:11 PM   Specimen: Nasal Mucosa; Nasal Swab  Result Value Ref Range Status   MRSA, PCR NEGATIVE NEGATIVE Final   Staphylococcus aureus NEGATIVE NEGATIVE Final    Comment: (NOTE) The Xpert SA Assay (FDA approved for NASAL specimens in patients  29 years of age and older), is one component of a comprehensive surveillance program. It is not intended to diagnose infection nor to guide or monitor treatment. Performed at [facility], [place] [address] [place], [place] [address]     Labs: CBC: Recent Labs  Lab 12/31/21 0232 01/02/22 0700 01/03/22 0218 01/04/22 0141 01/05/22 0825  WBC 14.1* 14.4* 16.3* 18.4* 13.0*  NEUTROABS 11.0*  --   --   --   --   HGB 12.2* 11.4* 11.0* 10.8* 11.3*  HCT 36.8* 34.6* 34.8* 32.8* 33.9*  MCV 103.7* 107.1* 106.7* 105.8* 105.0*  PLT 269 212 269 257 086   Basic Metabolic Panel: Recent Labs  Lab 12/31/21 0232 01/02/22 0700 01/05/22 0825  NA 130* 129* 130*  K 4.5 4.5 5.0  CL 89* 89* 89*  CO2 23 22 20*  GLUCOSE 98 136* 133*  BUN 71* 71* 102*  CREATININE 7.57* 7.39* 8.11*  CALCIUM 9.1 9.2 9.1  PHOS 7.0*  --  6.3*   Liver Function Tests: Recent Labs  Lab 12/31/21 0232 01/05/22 0825  ALBUMIN 2.8* 2.6*   CBG: Recent Labs  Lab 01/05/22 0754 01/05/22 1459 01/05/22 1541 01/05/22 2206 01/06/22 0806  GLUCAP 103* 142* 127* 178* 126*    Discharge time spent: 42 minutes.   Signed: Hosie Poisson, MD [facility] 01/06/2022

## 2022-01-06 NOTE — Progress Notes (Signed)
[name], male   DOB: 04-25-1971, 51 y.o.   MRN: [id number]  Pt arrived to 4W06 per bed. Pt educated on rehab and policies reviewed. Assessment complete, vitals obtained. Call light in reach, bed in low position, needs addressed. Sheela Stack, LPN

## 2022-01-06 NOTE — Progress Notes (Signed)
Discussed with patient pain control plan. We will discontinue oxycodone, switch hydromorphone to PO dosing and start methadone 2.5 mg BID.  He states he is using oxygen supplementation via Story 3-5 L at bedtime rather than c-pap while in hospital.

## 2022-01-06 NOTE — H&P (Signed)
Physical Medicine and Rehabilitation Admission H&P     CC: Acute  right below the knee amputation, prior left below the knee amputation   HPI: Michael Riggs is a 51 year old male with hx of L BKA, DM, ESRD on HD, orthostatic hypotension, PVD, Calciphylaxis, who presented to the emergency department on 12/25/2021 with worsening breakdown of right Achilles decubitus ulcer.  He has a history of peripheral vascular disease postintervention with drug-coated balloon angioplasty of the right lower extremity by Dr. Unk Lightning in March 2023.  He had undergone right Achilles debridement and placement of tissue graft by Dr. Sharol Given several days previously.  He has a history of end-stage renal disease on chronic intermittent hemodialysis and was receiving antibiotics during treatment.  He was admitted for concerns of osteomyelitis and placed on antibiotics and Dr. Sharol Given and nephrology consulted.  He was found to have progressive gangrenous changes involving the calcaneus and soft tissue envelope around the right heel.  Having failed limb salvage intervention, he recommended proceeding with transtibial amputation and he underwent right BKA on 6/16.  He tolerated the procedure well.  VAC was placed.  Hemodialysis continued on Mondays, Wednesdays and Fridays.  Antibiotics completed.  Attention noted to several skin lesions due to calciphylaxis.  Nephrology continues to manage as well as secondary hyperparathyroidism.The patient requires inpatient medicine and rehabilitation evaluations and services for ongoing dysfunction secondary to right below the knee amputation.     Pt reports phantom pain is awful- stabbing, burning, numbness- in leg that has been amputated.  Also, when puts R BKA down on bed, feels like squeezing like a melon.    LBM this AM- is anuric.  Having some bloating, but not constipated.      Review of Systems  Constitutional:  Negative for chills and fever.  HENT: Negative.    Eyes: Negative.    Respiratory:  Negative for sputum production.   Cardiovascular:  Negative for chest pain and palpitations.  Gastrointestinal:  Positive for abdominal pain. Negative for nausea and vomiting.       Bloating  Genitourinary: Negative.   Musculoskeletal:  Positive for joint pain.  Skin:        calciphylaxis  Neurological:  Positive for tingling. Negative for sensory change and headaches.  Psychiatric/Behavioral:  Positive for depression. Negative for hallucinations. The patient has insomnia. The patient is not nervous/anxious.   All other systems reviewed and are negative.       Past Medical History:  Diagnosis Date   Anemia     Anemia of chronic disease     Anxiety     ARF (acute renal failure) ([place]) 01/30/2019   Calciphylaxis     Controlled type 2 diabetes mellitus with hyperglycemia, without long-term current use of insulin ([place])     Debility 02/09/2019   Depression     Diabetes mellitus type 2 in obese ([place]) 07/04/2013   Diabetes mellitus with peripheral vascular disease ([place])      type 2 no meds, lost 100 lbs   Dyslipidemia     Dyspnea      inhaler   End stage renal disease ([place])     ESRD (end stage renal disease) on dialysis ([place]) 01/2019    MWFS   ESRD on dialysis [facility])     Essential hypertension     Fluid overload 02/04/2019   GERD (gastroesophageal reflux disease)     Goals of care, counseling/discussion     Habitual alcohol use 07/04/2013   Headache  Hyperkalemia 01/30/2019   Hypertension     Hypoglycemia 01/30/2019   Hypokalemia 01/30/2019   Labile blood pressure     Leukocytosis     Lower GI bleed 05/14/2019   Macrocytic anemia 01/30/2019   Obesity, Class II, BMI 35-39.9, with comorbidity 07/04/2013   Palliative care encounter     PDR (proliferative diabetic retinopathy) ([place]) 12/14/2013   Physical deconditioning     Pressure injury of skin 04/27/2019   Pruritus     Scrotal edema     Scrotal pain     Sleep apnea      uses CPAP   Sleep disturbance     Slow  transit constipation     Status post below-knee amputation of left lower extremity ([place])     Stroke ([place])      mini -shown on CT scan   Syphilis 01/2019   Urinary retention     Venous hypertension 09/22/2013   Wound infection 03/28/2019         Past Surgical History:  Procedure Laterality Date   ABDOMINAL AORTOGRAM W/LOWER EXTREMITY N/A 10/15/2021    Procedure: ABDOMINAL AORTOGRAM W/LOWER EXTREMITY;  Surgeon: Broadus John, MD;  Location: [place] [facility];  Service: Cardiovascular;  Laterality: N/A;   AMPUTATION Right 01/02/2022    Procedure: RIGHT BELOW KNEE AMPUTATION;  Surgeon: [name], MD;  Location: [place];  Service: Orthopedics;  Laterality: Right;   AV FISTULA PLACEMENT Left 01/09/2019    Procedure: ARTERIOVENOUS (AV) FISTULA CREATION LEFT UPPER ARM;  Surgeon: [name], MD;  Location: [facility];  Service: Vascular;  Laterality: Left;   [place] Left 07/06/2019    Procedure: BASILIC VEIN TRANSPOSITION SECOND STAGE LEFT ARM;  Surgeon: [name], MD;  Location: [place];  Service: Vascular;  Laterality: Left;   below the knee amputation Left     EYE SURGERY Bilateral     FLEXIBLE SIGMOIDOSCOPY N/A 05/15/2019    Procedure: FLEXIBLE SIGMOIDOSCOPY;  Surgeon: Irving Copas., MD;  Location: [place];  Service: Gastroenterology;  Laterality: N/A;   HEMOSTASIS CLIP PLACEMENT   05/15/2019    Procedure: HEMOSTASIS CLIP PLACEMENT;  Surgeon: Irving Copas., MD;  Location: [place];  Service: Gastroenterology;;   HOT HEMOSTASIS N/A 05/15/2019    Procedure: HOT HEMOSTASIS (ARGON PLASMA COAGULATION/BICAP);  Surgeon: Irving Copas., MD;  Location: [place];  Service: Gastroenterology;  Laterality: N/A;   I & D EXTREMITY Right 10/10/2021    Procedure: RIGHT ACHILLES DEBRIDEMENT AND TISSUE GRAFT;  Surgeon: [name], MD;  Location: [place];  Service: Orthopedics;  Laterality: Right;   IR FLUORO GUIDE CV LINE RIGHT   01/31/2019   IR  FLUORO GUIDE CV LINE RIGHT   02/03/2019   IR THORACENTESIS ASP PLEURAL SPACE W/IMG GUIDE   11/08/2019   IR US GUIDE VASC ACCESS RIGHT   01/31/2019   IR US GUIDE VASC ACCESS RIGHT   02/03/2019   PERIPHERAL VASCULAR BALLOON ANGIOPLASTY   10/15/2021    Procedure: PERIPHERAL VASCULAR BALLOON ANGIOPLASTY;  Surgeon: Broadus John, MD;  Location: [place] [facility];  Service: Cardiovascular;;  rt sfa and trifurcation   SCLEROTHERAPY   05/15/2019    Procedure: SCLEROTHERAPY;  Surgeon: Mansouraty, Telford Nab., MD;  Location: [facility] ENDOSCOPY;  Service: Gastroenterology;;         Family History  Problem Relation Age of Onset   Diabetes Mother     Multiple myeloma Mother      Social History:  reports that he quit smoking about 2 years ago. His smoking use included cigarettes. He started smoking about 3 years ago. He has never been exposed to tobacco smoke. He has never used smokeless tobacco. He reports that he does not currently use alcohol. He reports that he does not currently use drugs. Allergies:       Allergies  Allergen Reactions   Tape Itching      Prefers silk tape over regular tape          Medications Prior to Admission  Medication Sig Dispense Refill   acetaminophen (TYLENOL) 500 MG tablet Take 1,000 mg by mouth every 6 (six) hours as needed for mild pain.       albuterol (VENTOLIN HFA) 108 (90 Base) MCG/ACT inhaler Inhale 2 puffs into the lungs every 6 (six) hours as needed for wheezing or shortness of breath.       amLODipine (NORVASC) 5 MG tablet Take 5 mg by mouth at bedtime.        aspirin 81 MG EC tablet Take 1 tablet (81 mg total) by mouth daily. Swallow whole. 30 tablet 11   atorvastatin (LIPITOR) 40 MG tablet Take 1 tablet (40 mg total) by mouth daily. (Patient taking differently: Take 40 mg by mouth at bedtime.)       bromocriptine (PARLODEL) 2.5 MG tablet TAKE 1 TABLET BY MOUTH AT BEDTIME. (Patient taking differently: Take 2.5 mg by mouth at bedtime.) 90 tablet 1   buPROPion  (WELLBUTRIN XL) 300 MG 24 hr tablet Take 1 tablet (300 mg total) by mouth in the morning.       calcitRIOL (ROCALTROL) 0.5 MCG capsule Take 4 capsules (2 mcg total) by mouth every Monday, Wednesday, and Friday with hemodialysis.       clopidogrel (PLAVIX) 75 MG tablet Take 1 tablet (75 mg total) by mouth daily with breakfast. 30 tablet 2   DULoxetine (CYMBALTA) 20 MG capsule Take 20 mg by mouth 2 (two) times daily.       ferric citrate (AURYXIA) 1 GM 210 MG(Fe) tablet Take 630 mg by mouth 3 (three) times daily with meals.       HYDROmorphone (DILAUDID) 4 MG tablet Take 4 mg by mouth every 8 (eight) hours as needed for moderate pain.       ipratropium-albuterol (DUONEB) 0.5-2.5 (3) MG/3ML SOLN Take 3 mLs by nebulization every 6 (six) hours as needed (For shortness of breath).       Melatonin 10 MG TABS Take 10 mg by mouth at bedtime.       metoprolol tartrate (LOPRESSOR) 25 MG tablet Take 1 tablet (25 mg total) by mouth 2 (two) times daily. 60 tablet 0   midodrine (PROAMATINE) 10 MG tablet Take 10 mg by mouth every Monday, Wednesday, and Friday.       ondansetron (ZOFRAN) 4 MG tablet Take 4 mg by mouth daily as needed for vomiting.       pantoprazole (PROTONIX) 40 MG tablet Take 1 tablet (40 mg total) by mouth daily. (Patient taking differently: Take 40 mg by mouth at bedtime.)       silver sulfADIAZINE (SILVADENE) 1 % cream Apply 1 application. topically daily. Apply to affected area daily plus dry dressing 400 g 3   tamsulosin (FLOMAX) 0.4 MG CAPS capsule Take 1 capsule (0.4 mg total) by mouth daily after breakfast. (Patient taking differently: Take 0.4 mg by mouth at bedtime.) 30 capsule     vitamin C (ASCORBIC ACID) 500 MG tablet Take  500 mg by mouth at bedtime.              Home: Home Living Family/patient expects to be discharged to:: Private residence Living Arrangements: Parent, Other relatives (brother and sister-in-law) Available Help at Discharge: Family, Available PRN/intermittently  (mother and sister-in-law there 24/7 but not able to help pt) Type of Home: Apartment Home Access: Level entry Home Layout: One level Bathroom Shower/Tub: Chiropodist: Standard Bathroom Accessibility: Yes Home Equipment: Hand held shower head, Tub bench, Wheelchair - manual, Cane - single point, Hospital bed Additional Comments: brother also lives with pt and provides assist as needed.  Lives With: Family   Functional History: Prior Function Prior Level of Function : Independent/Modified Independent Mobility Comments: uses w/c in apartment to go to bathroom, prosthetic and cane to go short distance. ADLs Comments: Brother mainly cooks but he can if he wants to. Does not work, brother assists with community mobility.  Showers.   Functional Status:  Mobility: Bed Mobility Overal bed mobility: Needs Assistance Bed Mobility: Supine to Sit, Sit to Supine Supine to sit: Supervision, HOB elevated Sit to supine: Supervision General bed mobility comments: use of bedrails and increased effort to lift trunk but pt able to complete without assist Transfers Overall transfer level: Needs assistance Equipment used: Sliding board Transfers: Bed to chair/wheelchair/BSC Bed to/from chair/wheelchair/BSC transfer type:: Lateral/scoot transfer, Anterior-posterior transfer  Lateral/Scoot Transfers: Mod assist, +2 physical assistance, +2 safety/equipment (3rd person to steady WC) General transfer comment: pt declined to transfer to w/c OT brought to room d/t difficulty manuevering with R wheel issue Wheelchair Mobility Wheelchair mobility: Yes Wheelchair propulsion: Left upper extremity Wheelchair parts: Supervision/cueing Distance: 41f Wheelchair Assistance Details (indicate cue type and reason): to and from bathroom sink to perform self care, cueing required to lock brakes   ADL: ADL Overall ADL's : Needs assistance/impaired Eating/Feeding: Independent, Bed level Grooming:  Wash/dry hands, Wash/dry face, Set up, Bed level Upper Body Bathing: Set up, Bed level Lower Body Bathing: Minimal assistance, Bed level Upper Body Dressing : Set up, Bed level Lower Body Dressing: Moderate assistance, Bed level Toilet Transfer Details (indicate cue type and reason): No drop arm in room.  Able to perform lateral scoot with a lot of momentum and Min Guard. General ADL Comments: Educated on lateral leans for LB Dressing, discussed toilet transfer options (feel drop arm BSC over toilet may give pt enough space for scoot/sliding board transfers). Educated on UE HEP, discussed skin integrity importance d/t noted wounds on L residual limb that may hinder prosthetic use. Had brought wheelchair in room for pt to work with as in previous session though pt reported that w/c R wheel did not turn correctly and was difficult to manuever   Cognition: Cognition Overall Cognitive Status: Within Functional Limits for tasks assessed Orientation Level: Oriented X4 Cognition Arousal/Alertness: Awake/alert Behavior During Therapy: WFL for tasks assessed/performed Overall Cognitive Status: Within Functional Limits for tasks assessed   Physical Exam: Blood pressure 113/80, pulse 60, temperature 98 F (36.7 C), temperature source Oral, resp. rate 18, height 5' 10" (1.778 m), weight 103.3 kg, SpO2 97 %. Physical Exam Vitals and nursing note reviewed.  Constitutional:      Comments: Pt sitting up in bed; food just delivered; wearing Eyeglasses; said iphone went missing while in bathroom; Wound VAC on R BKA, NAD  HENT:     Head: Normocephalic and atraumatic.     Right Ear: External ear normal.     Left Ear: External ear  normal.     Nose: Nose normal. No congestion.     Mouth/Throat:     Mouth: Mucous membranes are dry.     Pharynx: Oropharynx is clear. No oropharyngeal exudate.  Eyes:     General:        Right eye: No discharge.        Left eye: No discharge.     Extraocular Movements:  Extraocular movements intact.  Cardiovascular:     Rate and Rhythm: Normal rate and regular rhythm.     Heart sounds: Normal heart sounds. No murmur heard.    No gallop.  Abdominal:     Comments: Slightly protuberant vs distended; soft, NT, (+) normoactive BS  Musculoskeletal:     Cervical back: Neck supple. No tenderness.     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     Comments: Decreased to light touch from knees down B/L  Psychiatric:     Comments: Flat, but interactive        Lab Results Last 48 Hours        Results for orders placed or performed during the hospital encounter of 12/25/21 (from the past 48 hour(s))  Glucose, capillary     Status: Abnormal    Collection Time: 01/04/22 12:50 PM  Result Value Ref Range    Glucose-Capillary 278 (H) 70 - 99 mg/dL      Comment: Glucose reference range applies only to samples taken after fasting for at least 8 hours.  Glucose, capillary     Status: Abnormal    Collection Time: 01/04/22  5:35 PM  Result Value Ref Range    Glucose-Capillary 138 (H) 70 - 99 mg/dL      Comment: Glucose reference range applies only to samples taken after fasting for at least 8 hours.  Glucose, capillary     Status: Abnormal    Collection Time: 01/04/22  9:07 PM  Result Value Ref Range    Glucose-Capillary 177 (H) 70 - 99 mg/dL      Comment: Glucose reference range applies only to samples taken after fasting for at least 8 hours.  Glucose, capillary     Status: Abnormal    Collection Time:  01/05/22  7:54 AM  Result Value Ref Range    Glucose-Capillary 103 (H) 70 - 99 mg/dL      Comment: Glucose reference range applies only to samples taken after fasting for at least 8 hours.  Renal function panel     Status: Abnormal    Collection Time: 01/05/22  8:25 AM  Result Value Ref Range    Sodium 130 (L) 135 - 145 mmol/L    Potassium 5.0 3.5 - 5.1 mmol/L    Chloride 89 (L) 98 - 111 mmol/L    CO2 20 (L) 22 - 32 mmol/L    Glucose, Bld 133 (H) 70 - 99 mg/dL      Comment: Glucose reference range applies only to samples  taken after fasting for at least 8 hours.    BUN 102 (H) 6 - 20 mg/dL    Creatinine, Ser 8.11 (H) 0.61 - 1.24 mg/dL    Calcium 9.1 8.9 - 10.3 mg/dL    Phosphorus 6.3 (H) 2.5 - 4.6 mg/dL    Albumin 2.6 (L) 3.5 - 5.0 g/dL    GFR, Estimated 7 (L) >60 mL/min      Comment: (NOTE) Calculated using the CKD-EPI Creatinine Equation (2021)      Anion gap 21 (H) 5 - 15      Comment: Electrolytes repeated to confirm. Performed at [facility], [place] [address] [place], [place] [address]    CBC     Status: Abnormal    Collection Time: 01/05/22  8:25 AM  Result Value Ref Range    WBC 13.0 (H) 4.0 - 10.5 K/uL    RBC 3.23 (L) 4.22 - 5.81 MIL/uL    Hemoglobin 11.3 (L) 13.0 - 17.0 g/dL    HCT 33.9 (L) 39.0 - 52.0 %    MCV 105.0 (H) 80.0 - 100.0 fL    MCH 35.0 (H) 26.0 - 34.0 pg    MCHC 33.3 30.0 - 36.0 g/dL    RDW 15.8 (H) 11.5 - 15.5 %    Platelets 239 150 - 400 K/uL    nRBC 0.0 0.0 - 0.2 %      Comment: Performed at [facility] [address] [place], [place] [address]  Glucose, capillary     Status: Abnormal    Collection Time: 01/05/22  2:59 PM  Result Value Ref Range    Glucose-Capillary 142 (H) 70 - 99 mg/dL      Comment: Glucose reference range applies only to samples taken after fasting for at least 8 hours.  Glucose, capillary     Status: Abnormal    Collection Time: 01/05/22  3:41 PM  Result Value Ref Range    Glucose-Capillary 127 (H) 70 - 99  mg/dL      Comment: Glucose reference range applies only to samples taken after fasting for at least 8 hours.  Glucose, capillary     Status: Abnormal    Collection Time: 01/05/22 10:06 PM  Result Value Ref Range    Glucose-Capillary 178 (H) 70 - 99 mg/dL      Comment: Glucose reference range applies only to samples taken after fasting for at least 8 hours.  Glucose, capillary     Status: Abnormal    Collection Time: 01/06/22  8:06 AM  Result Value Ref Range    Glucose-Capillary 126 (H) 70 - 99 mg/dL      Comment: Glucose reference range applies only to samples taken after fasting for at least 8 hours.  Glucose, capillary     Status: Abnormal    Collection Time: 01/06/22 11:45 AM  Result Value Ref Range    Glucose-Capillary 125 (H) 70 - 99 mg/dL      Comment: Glucose reference range applies only to samples taken after fasting for at least 8 hours.      Imaging Results (Last 48 hours)  No results found.         Blood pressure 113/80, pulse 60, temperature 98 F (36.7 C), temperature source Oral, resp. rate 18, height 5' 10" (1.778 m), weight 103.3 kg, SpO2 97 %.   Medical Problem List and Plan: 1. Functional deficits secondary to right below the knee amputation, ESRD on HD.             -  patient may not shower until VAC removed             -ELOS/Goals: 7-10 days w/c level supervision to mod I 2.  Antithrombotics: -DVT/anticoagulation:  Pharmaceutical: Heparin             -antiplatelet therapy: Plavix and aspirin 3. Pain Management: Significant phantom pain             --will continue oxycodone 15 mg q 4 hours and consider methadone 2.5 mg in place of IV Dilaudid             -suggest methadone 2,5 mg BID since helpful for nerve pain esp in ESRD pt- and stop IV Dilaudid 4. Mood/Sleep: LCSW to evaluate and provide emotional support             -antipsychotic agents: n/a             -continue melatonin q HS             -continue Cymbalta DR 20 mg BID             -continue  Wellbutrin XL 300 mg q AM 5. Neuropsych/cognition: This patient is capable of making decisions on his own behalf. 6. Skin/Wound Care: Routine skin care checks             --Wound VAC to surgical incision; plan to remove Friday             --monitor calciphylaxis skin lesions 7. Fluids/Electrolytes/Nutrition: Strict Is and Os             --HD/per nephrology 8: ESRD: HD on Monday/Wednesday/Friday                         --chronic hypotension on midodrine pre-HD 9: Essential hypertension: continue metoprolol 25 mg BID             -- Amlodipine discontinued 10: Peripheral arterial disease s/p intervention             --maintained on aspirin, Plavix and statin 11: DM-2: continue CBGs q AC/HS             --SSI, Levemir 5 units daily             --home regimen: none/diet controlled (?) 12: Calciphylaxis: management per nephrology             --monitor left lower extremity and right 2nd finger lesions 13: BPH: continue Flomax 14: Anemia of ESRD: continue ferric citrate and follow-up CBC 15: Abdominal bloating/dyspepsia: PPI   I have personally performed a face to face diagnostic evaluation of this patient and formulated the key components of the plan.  Additionally, I have personally reviewed laboratory data, imaging studies, as well as relevant notes and concur with the physician assistant's documentation above.   The patient's status has not changed from the original H&P.  Any changes in documentation from the acute care chart have been noted above.       Barbie Banner, PA-C 01/06/2022

## 2022-01-06 NOTE — Progress Notes (Signed)
Inpatient Rehabilitation Admissions Coordinator   I met with patient at bedside . CIR bed is available today. Dr Karleen Hampshire contacted and gave ok to discharge to CIR today. Hemodialysis made aware of CIR admit. Acute team made aware and I will make the arrangements.  Danne Baxter, RN, MSN Rehab Admissions Coordinator [phone] 01/06/2022 10:11 AM

## 2022-01-06 NOTE — Progress Notes (Incomplete)
Inpatient Rehabilitation Admission Medication Review by a Pharmacist  A complete drug regimen review was completed for this patient to identify any potential clinically significant medication issues.  High Risk Drug Classes Is patient taking? Indication by Medication  Antipsychotic Yes Compazine- N/V  Anticoagulant Yes Heparin- VTE prophylaxis  Antibiotic No   Opioid Yes Dilaudid, OxyIR- acute pain  Antiplatelet Yes Plavix, aspirin- CVA prophylaxis  Hypoglycemics/insulin Yes Insulin- T2DM  Vasoactive Medication Yes Flomax- BPH Lopressor- hypertension Midodrine- orthostatic hypotension  Chemotherapy No   Other Yes Lipitor- HLD Protonix- GERD Wellbutrin- MDD Duloxetine- MDD Melatonin- sleep Auryxia- hyperphosphatemia 2/2 ESRD HD dependant     Type of Medication Issue Identified Description of Issue Recommendation(s)  Drug Interaction(s) (clinically significant)     Duplicate Therapy     Allergy     No Medication Administration End Date     Incorrect Dose     Additional Drug Therapy Needed     Significant med changes from prior encounter (inform family/care partners about these prior to discharge).    Other  PTA meds: Rocaltrol 0.5 mcg 4 caps qmwf w/ HD Restart PTA meds when and if necessary during CIR admission or at time of discharge, if warranted    Clinically significant medication issues were identified that warrant physician communication and completion of prescribed/recommended actions by midnight of the next day:  No   Time spent performing this drug regimen review (minutes):  30   Danthony Kendrix BS, PharmD, BCPS Clinical Pharmacist 01/06/2022 12:05 PM  Contact: [phone] after 3 PM  "Be curious, not judgmental..." -Jamal Maes

## 2022-01-06 NOTE — Progress Notes (Addendum)
[facility] Progress Note   Subjective: Seen in room. No C/Os. Next HD 01/07/2022. Being discharged to [facility] today.   Objective Vitals:   01/05/22 1413 01/05/22 1454 01/05/22 2206 01/06/22 0808  BP: 126/76 127/77 127/82 113/80  Pulse: (!) 126 69 67 60  Resp: 17 18 17 18   Temp:  97.7 F (36.5 C) 98.7 F (37.1 C) 98 F (36.7 C)  TempSrc:  Oral  Oral  SpO2: 92% 97% 96% 97%  Weight:      Height:       Physical Exam General: Chronically ill appearing male in NAD Heart: S1,S2 No M/R/G SR on monitor rate 70s rare pacs.  Lungs: CTAB Abdomen:NABS, NT Extremities: R BKA with wound vac intact. L BKA with large necrotic appearing wound approx 3 inches below L knee, another smaller similar area to lateral side of his knee. Another lesion R index finger.  Dialysis Access: L AVF +T/B  Dialysis Orders:  Additional Objective Labs: Basic Metabolic Panel: Recent Labs  Lab 12/31/21 0232 01/02/22 0700 01/05/22 0825  NA 130* 129* 130*  K 4.5 4.5 5.0  CL 89* 89* 89*  CO2 23 22 20*  GLUCOSE 98 136* 133*  BUN 71* 71* 102*  CREATININE 7.57* 7.39* 8.11*  CALCIUM 9.1 9.2 9.1  PHOS 7.0*  --  6.3*   Liver Function Tests: Recent Labs  Lab 12/31/21 0232 01/05/22 0825  ALBUMIN 2.8* 2.6*   No results for input(s): "LIPASE", "AMYLASE" in the last 168 hours. CBC: Recent Labs  Lab 12/31/21 0232 01/02/22 0700 01/03/22 0218 01/04/22 0141 01/05/22 0825  WBC 14.1* 14.4* 16.3* 18.4* 13.0*  NEUTROABS 11.0*  --   --   --   --   HGB 12.2* 11.4* 11.0* 10.8* 11.3*  HCT 36.8* 34.6* 34.8* 32.8* 33.9*  MCV 103.7* 107.1* 106.7* 105.8* 105.0*  PLT 269 212 269 257 239   Blood Culture    Component Value Date/Time   SDES BLOOD RIGHT FOREARM 12/26/2021 0040   SPECREQUEST  12/26/2021 0040    BOTTLES DRAWN AEROBIC AND ANAEROBIC Blood Culture adequate volume   CULT MORGANELLA MORGANII (A) 12/26/2021 0040   REPTSTATUS 12/28/2021 FINAL 12/26/2021 0040    Cardiac Enzymes: No  results for input(s): "CKTOTAL", "CKMB", "CKMBINDEX", "TROPONINI" in the last 168 hours. CBG: Recent Labs  Lab 01/05/22 0754 01/05/22 1459 01/05/22 1541 01/05/22 2206 01/06/22 0806  GLUCAP 103* 142* 127* 178* 126*   Iron Studies: No results for input(s): "IRON", "TIBC", "TRANSFERRIN", "FERRITIN" in the last 72 hours. @lablastinr3 @ Studies/Results: No results found. Medications:  sodium chloride 10 mL/hr at 01/02/22 1811   magnesium sulfate bolus IVPB     sodium thiosulfate 25 g in sodium chloride 0.9 % 200 mL Infusion for Calciphylaxis Stopped (01/05/22 1520)    vitamin C  1,000 mg Oral Daily   aspirin EC  81 mg Oral Daily   atorvastatin  40 mg Oral QHS   bromocriptine  2.5 mg Oral QHS   buPROPion  300 mg Oral q AM   Chlorhexidine Gluconate Cloth  6 each Topical Q0600   clopidogrel  75 mg Oral Daily   docusate sodium  100 mg Oral Daily   DULoxetine  20 mg Oral BID   ferric citrate  630 mg Oral TID WC   heparin  5,000 Units Subcutaneous Q8H   insulin aspart  0-6 Units Subcutaneous TID WC   insulin aspart  2 Units Subcutaneous TID WC   insulin detemir  5 Units Subcutaneous Daily  melatonin  10 mg Oral QHS   metoprolol tartrate  25 mg Oral BID   midodrine  10 mg Oral Q M,W,F   multivitamin  1 tablet Oral QHS   nutrition supplement (JUVEN)  1 packet Oral BID BM   pantoprazole  40 mg Oral Daily   tamsulosin  0.4 mg Oral QHS   urea   Topical BID   zinc sulfate  220 mg Oral Daily     Dialysis Orders:  on MWF 180NRe 5 hours BFR 400 DFR 500 EDW 98.4kg 2K 2Ca AVF 15g needles - No heparin/ESA - Calcitriol 1 mcg PO q HD   Assessment/Plan: Chronic RLE wound/osteomyelitis: Failed limb salvage attempts, 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 Wound vac in place. Per ortho. ABX have been discontinued.  ESRD: Continue HD on usual MWF schedule - next HD on Monday 01/07/2022 BP/volume: BP has been low, amlodipine d/c'd. Remains on metop BID with holding parameters. Using midodrine pre-HD.  EDW will need to be adjusted s/p amputation on discharge. Despite amputation he is still very much above OP EDW.  UF as tolerated in HD and lower volume. Anemia of ESRD: Hgb 11.3 01/05/2022. Follow HGB.    Secondary Hyperparathyroidism: CorrCa high sided, calcitriol reduced 44mcg -> 6mcg q HD and now stopping Phos high, changed to usual OP binder Lorin Picket).  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 Will resume Sodium Thiosulfate 25 gram with HD TIW. He agrees to plan. Needs lower calcium levels. Stop VDRA. Use 2.0 Ca Bath Nutrition: Alb low, continue supplements. T2DM- per primary.  Debility-being discharged to [facility] today.   Rita H. Brown NP-C 01/06/2022, 10:44 AM  [employer] [phone]   Seen and examined independently.  Agree with note and exam as documented above by physician extender and as noted here.  He is moving to CIR today.  Feels ok.  Some shortness of breath overnight and he states that he used oxygen for the same. Sometimes feels dizzy.  General adult male in bed in no acute distress HEENT normocephalic atraumatic extraocular movements intact sclera anicteric Neck supple trachea midline Lungs clear to auscultation bilaterally normal work of breathing at rest  Heart S1S2 no rub Abdomen soft nontender nondistended Extremities bilateral BKA   Psych normal mood and affect Access LUE AVF bruit and thrill    ESRD - HD per MWF schedule.  Changed to renal diabetic diet    Chronic right LE wound and osteo - now s/p right BKA on 6/16   Hx calciphylaxis. Now with concern for recurrence - we have started sodium thiosulfate and stopped calcitriol for now    Anemia CKD Hb 11.3 - meets goal  Claudia Desanctis, MD 01/06/2022  12:50 PM

## 2022-01-06 NOTE — Progress Notes (Signed)
Courtney Heys, MD  Physician Physical Medicine and Rehabilitation PMR Pre-admission     Signed Date of Service:  01/05/2022  4:37 PM  Related encounter: ED to Hosp-Admission (Current) from 12/25/2021 in [place]      PMR Admission Coordinator Pre-Admission Assessment   Patient: Michael Riggs is an 51 y.o., male MRN: [id number] DOB: [birth year] Height: _0  (177.8 cm) Weight: 103.3 kg   Insurance Information HMO:     PPO:      PCP:      IPA:      80/20: yes     OTHER:  PRIMARY: Medicare A & B      Policy#: [id number]      Subscriber: patient CM Name:       Phone#:      Fax#:  Pre-Cert#:       Employer:  Benefits:  Phone #: verified eligibility via [employer] on 01/05/22     Name:  Eff. Date: Part A & B effective 05/20/17     Deduct: $1,600      Out of Pocket Max: NA      Life Max: NA CIR: 100% coverage      SNF: 100% coverage days 1-20, 80% coverage days 21-100 Outpatient: 80% coverage     Co-Pay: 20% Home Health: 100% coverage      Co-Pay:  DME: 80% coverage     Co-Pay: 20% Providers: pt's choice  SECONDARY: Medicaid [place] Access    Policy#: [id number] l     active 6/20 MADBN   Financial Counselor:       Phone#:    The Therapist, art Information Summary" for patients in Inpatient Rehabilitation Facilities with attached "Privacy Act [place] Records" was provided and verbally reviewed with: Patient   Emergency Contact Information Contact Information       Name Relation Home Work [name], [name] Sister     [phone]    [name], [name] [phone]   [phone]    [name], [name] Mother [phone]             Current Medical History  Patient Admitting Diagnosis: s/p R BKA   History of Present Illness: 51 year old male with history of ESRD on hemodialysis, Chronic hypotension requiring midodrine, type 2 DM, calciphylaxis, hyperprolactinemia, PVD who has had 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 Presented to ED on 12/25/21 for concerns for osteomyelitis of the right calcaneal area.    Found to have breakdown of his calcaneus posteriorly , osteomyelitis and started on antibiotics. Orthopedic surgery consulted. Prior history of angioplasty to the distal SFA and DES placement on 10/06/21. 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 Placed back on Asa, Plavix and Lipitor. Pain control with oxycodone.    Nephrology consulted to provide Hemodialysis . Midodrine prior to hemodialysis. Continue with low dose Levemir and sliding scale insulin. On Metoprolol for essential HTN. Flomax for BPH.    Patient's medical record from [facility] has been reviewed by the rehabilitation admission coordinator and physician.   Past Medical History      Past Medical History:  Diagnosis Date   Anemia     Anemia of chronic disease     Anxiety     ARF (acute renal failure) ([place]) 01/30/2019   Calciphylaxis  Controlled type 2 diabetes mellitus with hyperglycemia, without long-term current use of insulin ([place])     Debility 02/09/2019   Depression     Diabetes mellitus type 2 in obese ([place]) 07/04/2013   Diabetes mellitus with peripheral vascular disease ([place])      type 2 no meds, lost 100 lbs   Dyslipidemia     Dyspnea      inhaler   End stage renal disease ([place])     ESRD (end stage renal disease) on dialysis ([place]) 01/2019    MWFS   ESRD on dialysis [facility])     Essential hypertension     Fluid overload 02/04/2019   GERD (gastroesophageal reflux disease)     Goals of care, counseling/discussion     Habitual alcohol use 07/04/2013   Headache     Hyperkalemia 01/30/2019   Hypertension     Hypoglycemia 01/30/2019   Hypokalemia 01/30/2019   Labile blood pressure     Leukocytosis     Lower GI bleed 05/14/2019   Macrocytic anemia 01/30/2019   Obesity, Class  II, BMI 35-39.9, with comorbidity 07/04/2013   Palliative care encounter     PDR (proliferative diabetic retinopathy) ([place]) 12/14/2013   Physical deconditioning     Pressure injury of skin 04/27/2019   Pruritus     Scrotal edema     Scrotal pain     Sleep apnea      uses CPAP   Sleep disturbance     Slow transit constipation     Status post below-knee amputation of left lower extremity ([place])     Stroke ([place])      mini -shown on CT scan   Syphilis 01/2019   Urinary retention     Venous hypertension 09/22/2013   Wound infection 03/28/2019    Has the patient had major surgery during 100 days prior to admission? Yes   Family History   family history includes Diabetes in his mother; Multiple myeloma in his mother.   Current Medications   Current Facility-Administered Medications:    0.9 %  sodium chloride infusion, , Intravenous, Continuous, Charlynne Cousins, MD, Last Rate: 10 mL/hr at 01/02/22 1811, New Bag at 01/02/22 1811   acetaminophen (TYLENOL) tablet 325-650 mg, 325-650 mg, Oral, Q6H PRN, [name], MD, 650 mg at 01/05/22 1548   albuterol (PROVENTIL) (2.5 MG/3ML) 0.083% nebulizer solution 3 mL, 3 mL, Inhalation, Q6H PRN, [name], MD   ascorbic acid (VITAMIN C) tablet 1,000 mg, 1,000 mg, Oral, Daily, [name], MD, 1,000 mg at 01/06/22 1042   aspirin EC tablet 81 mg, 81 mg, Oral, Daily, [name], MD, 81 mg at 01/06/22 1043   atorvastatin (LIPITOR) tablet 40 mg, 40 mg, Oral, QHS, [name], MD, 40 mg at 01/05/22 2153   bisacodyl (DULCOLAX) EC tablet 5 mg, 5 mg, Oral, Daily PRN, [name], MD   bromocriptine (PARLODEL) tablet 2.5 mg, 2.5 mg, Oral, QHS, [name], MD, 2.5 mg at 01/05/22 2153   buPROPion (WELLBUTRIN XL) 24 hr tablet 300 mg, 300 mg, Oral, q AM, [name], MD, 300 mg at 01/06/22 0555   Chlorhexidine Gluconate Cloth 2 % PADS 6 each, 6 each, Topical, Q0600, [name], MD, 6 each at 01/06/22 0438   clopidogrel (PLAVIX)  tablet 75 mg, 75 mg, Oral, Daily, Charlynne Cousins, MD, 75 mg at 01/06/22 1043   docusate sodium (COLACE) capsule 100 mg, 100 mg, Oral, Daily, Meridee Score  V, MD, 100 mg at 01/04/22 0852   DULoxetine (CYMBALTA) DR capsule 20 mg, 20 mg, Oral, BID, [name], MD, 20 mg at 01/06/22 1043   ferric citrate (AURYXIA) tablet 630 mg, 630 mg, Oral, TID WC, Loren Racer, PA-C, 630 mg at 01/06/22 0820   guaiFENesin-dextromethorphan (ROBITUSSIN DM) 100-10 MG/5ML syrup 15 mL, 15 mL, Oral, Q4H PRN, [name], MD   heparin injection 5,000 Units, 5,000 Units, Subcutaneous, Q8H, [name], MD, 5,000 Units at 01/06/22 0437   hydrALAZINE (APRESOLINE) injection 5 mg, 5 mg, Intravenous, Q20 Min PRN, [name], MD   HYDROmorphone (DILAUDID) injection 0.5-1 mg, 0.5-1 mg, Intravenous, Q4H PRN, [name], MD, 1 mg at 01/02/22 2341   HYDROmorphone (DILAUDID) tablet 4 mg, 4 mg, Oral, Q8H PRN, [name], MD, 4 mg at 01/05/22 1206   hydrOXYzine (ATARAX) tablet 10 mg, 10 mg, Oral, TID PRN, [name], MD, 10 mg at 01/04/22 1948   insulin aspart (novoLOG) injection 0-6 Units, 0-6 Units, Subcutaneous, TID WC, [name], MD, 3 Units at 01/04/22 1202   insulin aspart (novoLOG) injection 2 Units, 2 Units, Subcutaneous, TID WC, Charlynne Cousins, MD, 2 Units at 01/06/22 0821   insulin detemir (LEVEMIR) injection 5 Units, 5 Units, Subcutaneous, Daily, Charlynne Cousins, MD, 5 Units at 01/06/22 1043   ipratropium-albuterol (DUONEB) 0.5-2.5 (3) MG/3ML nebulizer solution 3 mL, 3 mL, Nebulization, Q6H PRN, [name], MD   labetalol (NORMODYNE) injection 10 mg, 10 mg, Intravenous, Q10 min PRN, [name], MD   loperamide HCl (IMODIUM) 1 MG/7.5ML suspension 4 mg, 4 mg, Oral, Q6H PRN, [name], MD, 4 mg at 01/01/22 2215   magnesium sulfate IVPB 2 g 50 mL, 2 g, Intravenous, Daily PRN, [name], MD   melatonin tablet 10 mg, 10 mg, Oral, QHS, [name], MD, 10 mg at  01/05/22 2152   metoprolol tartrate (LOPRESSOR) injection 2-5 mg, 2-5 mg, Intravenous, Q2H PRN, [name], MD   metoprolol tartrate (LOPRESSOR) tablet 25 mg, 25 mg, Oral, BID, [name], MD, 25 mg at 01/06/22 1042   midodrine (PROAMATINE) tablet 10 mg, 10 mg, Oral, Q M,W,F, [name], MD, 10 mg at 01/05/22 1159   multivitamin (RENA-VIT) tablet 1 tablet, 1 tablet, Oral, QHS, [name], MD, 1 tablet at 01/05/22 2152   nutrition supplement (JUVEN) (JUVEN) powder packet 1 packet, 1 packet, Oral, BID BM, [name], MD, 1 packet at 01/06/22 1043   ondansetron (ZOFRAN) injection 4 mg, 4 mg, Intravenous, Q6H PRN, [name], MD   oxyCODONE (Oxy IR/ROXICODONE) immediate release tablet 10-15 mg, 10-15 mg, Oral, Q4H PRN, [name], MD, 15 mg at 01/06/22 0443   oxyCODONE (Oxy IR/ROXICODONE) immediate release tablet 5-10 mg, 5-10 mg, Oral, Q4H PRN, [name], MD, 10 mg at 01/02/22 1222   pantoprazole (PROTONIX) EC tablet 40 mg, 40 mg, Oral, Daily, [name], MD, 40 mg at 01/06/22 1043   phenol (CHLORASEPTIC) mouth spray 1 spray, 1 spray, Mouth/Throat, PRN, [name], MD   polyethylene glycol (MIRALAX / GLYCOLAX) packet 17 g, 17 g, Oral, Daily PRN, [name], MD   sodium thiosulfate 25 g in sodium chloride 0.9 % 200 mL Infusion for Calciphylaxis, 25 g, Intravenous, Q M,W,F-HD, Valentina Gu, NP, Stopping previously hung infusion at 01/05/22 1520   tamsulosin (FLOMAX) capsule 0.4 mg, 0.4 mg, Oral, QHS, Meridee Score  V, MD, 0.4 mg at 01/05/22 2152   urea 10 % lotion, , Topical, BID, [name], MD, Given at 01/06/22 1044   zinc sulfate capsule 220 mg, 220 mg, Oral, Daily, [name], MD, 220 mg at 01/06/22 1043   Patients Current Diet:  Diet Order                  Diet - low sodium heart healthy             Diet regular Room service appropriate? Yes; Fluid consistency: Thin; Fluid restriction: 1200 mL Fluid  Diet effective now                        Precautions / Restrictions Precautions Precautions: Fall, Other (comment) Precaution Comments: multiple areas of tissue breakdown to B legs, elbows. Wound vac Restrictions Weight Bearing Restrictions: Yes RLE Weight Bearing: Non weight bearing LLE Weight Bearing: Non weight bearing Other Position/Activity Restrictions: Has a L prosthetic, wears for brief periods/transfers only.    Has the patient had 2 or more falls or a fall with injury in the past year? Yes   Prior Activity Level Community (5-7x/wk): got out of house to go to dialysis   Prior Functional Level Self Care: Did the patient need help bathing, dressing, using the toilet or eating? Independent   Indoor Mobility: Did the patient need assistance with walking from room to room (with or without device)? Independent   Stairs: Did the patient need assistance with internal or external stairs (with or without device)? Needed some help   Functional Cognition: Did the patient need help planning regular tasks such as shopping or remembering to take medications? Independent   Patient Information Are you of Hispanic, Latino/a,or Spanish origin?: E. Yes, another Hispanic, Latino, or Spanish origin What is your race?: Z. None of the above Do you need or want an interpreter to communicate with a doctor or health care staff?: 0. No   Patient's Response To:  Health Literacy and Transportation Is the patient able to respond to health literacy and transportation needs?: Yes Health Literacy - How often do you need to have someone help you when you read instructions, pamphlets, or other written material from your doctor or pharmacy?: Never In the past 12 months, has lack of transportation kept you from medical appointments or from getting medications?: No In the past 12 months, has lack of transportation kept you from meetings, work, or from getting things needed for daily living?: No   Home Assistive Devices /  [place] Devices/Equipment: Radio producer (specify quad or straight) Home Equipment: Hand held shower head, Tub bench, Wheelchair - manual, Cane - single point, Hospital bed   Prior Device Use: Indicate devices/aids used by the patient prior to current illness, exacerbation or injury?  cane   Current Functional Level Cognition   Overall Cognitive Status: Within Functional Limits for tasks assessed Orientation Level: Oriented X4    Extremity Assessment (includes Sensation/Coordination)   Upper Extremity Assessment: Overall WFL for tasks assessed  Lower Extremity Assessment: Overall WFL for tasks assessed     ADLs   Overall ADL's : Needs assistance/impaired Eating/Feeding: Independent, Bed level Grooming: Wash/dry hands, Wash/dry face, Set up, Bed level Upper Body Bathing: Set up, Bed level Lower Body Bathing: Minimal assistance, Bed level Upper Body Dressing : Set up, Bed level Lower Body Dressing: Moderate assistance, Bed level Toilet Transfer Details (indicate cue type and reason): No drop arm in  room.  Able to perform lateral scoot with a lot of momentum and Min Guard.     Mobility   Overal bed mobility: Needs Assistance Bed Mobility: Supine to Sit Supine to sit: Supervision, HOB elevated General bed mobility comments: brings bed to 45 degrees to pivot on his bottom.     Transfers   Overall transfer level: Needs assistance Equipment used: Sliding board Transfers: Bed to chair/wheelchair/BSC Bed to/from chair/wheelchair/BSC transfer type:: Lateral/scoot transfer, Anterior-posterior transfer  Lateral/Scoot Transfers: Mod assist, +2 physical assistance, +2 safety/equipment (3rd person to steady WC) General transfer comment: Bed pad and gait belt assist due to poor sitting balance for lateral slide board transfer. Bed pad and LE assist for AP transfer back to bed from [place] / Gait / Stairs / Investment banker, corporate mobility:  Yes Wheelchair propulsion: Left upper extremity Wheelchair parts: Supervision/cueing Distance: 35f Wheelchair Assistance Details (indicate cue type and reason): to and from bathroom sink to perform self care, cueing required to lock brakes     Posture / Balance Dynamic Sitting Balance Sitting balance - Comments: LOB when donning gait belt Balance Overall balance assessment: Needs assistance Sitting-balance support: Bilateral upper extremity supported Sitting balance-Leahy Scale: Fair Sitting balance - Comments: LOB when donning gait belt Postural control: Posterior lean     Special needs/care consideration Hemodialysis MWF in [place]. Brother transports to hemodialysis Patient's cell phone lost on acute in his room 5n15    Previous Home Environment  Living Arrangements: Parent, Other relatives (brother and sister-in-law)  Lives With: Family Available Help at Discharge: Family, Available PRN/intermittently (mother and sister-in-law there 24/7 but not able to help pt) Type of Home: Apartment Home Layout: One level Home Access: Level entry Bathroom Shower/Tub: TChiropodist Standard Bathroom Accessibility: Yes How Accessible: Accessible via walker Home Care Services: Yes Type of Home Care Services: Homehealth aide Additional Comments: brother also lives with pt and provides assist as needed.   Discharge Living Setting Plans for Discharge Living Setting: Lives with (comment), Apartment (parent, brother and sister in law) Type of Home at Discharge: Apartment Discharge Home Layout: One level Discharge Home Access: Level entry Discharge Bathroom Shower/Tub: Tub/shower unit Discharge Bathroom Toilet: Standard Discharge Bathroom Accessibility: Yes How Accessible: Accessible via walker Does the patient have any problems obtaining your medications?: No   Social/Family/Support Systems Contact Information: brother, Moises Anticipated Caregiver:  brother Anticipated CAmbulance personInformation: see contacts Ability/Limitations of Caregiver: brother available prn, for he works Caregiver Availability: Intermittent Discharge Plan Discussed with Primary Caregiver: Yes Is Caregiver In Agreement with Plan?: Yes Does Caregiver/Family have Issues with Lodging/Transportation while Pt is in Rehab?: Yes   Goals Patient/Family Goal for Rehab: Mod I to intermittent supervision with PT and OT at wheelchair level Expected length of stay: ELOS 7 to 10 days Pt/Family Agrees to Admission and willing to participate: Yes Program Orientation Provided & Reviewed with Pt/Caregiver Including Roles  & Responsibilities: Yes   Decrease burden of Care through IP rehab admission: NA   Possible need for SNF placement upon discharge: Not anticipated   Patient Condition: [facility] spoken with  patient and family member. I met with patient at the bedside and discussed via phone for inpatient rehabilitation assessment.  Patient will benefit from ongoing PT and OT, can actively participate in 3 hours of therapy a day 5 days of the week, and can make measurable gains during the admission.  Patient will also benefit from the coordinated team approach during an Inpatient Acute Rehabilitation admission.  The patient will receive intensive therapy as well as Rehabilitation physician, nursing, social worker, and care management interventions.  Due to safety, skin/wound care, disease management, medication administration, pain management, and patient education the patient requires 24 hour a day rehabilitation nursing.  The patient is currently mod assist overall with mobility and basic ADLs.  Discharge setting and therapy post discharge at home with home health is anticipated.  Patient has agreed to participate in the Acute Inpatient Rehabilitation Program and will admit today.   Preadmission Screen Completed By:  Danne Baxter RN MSN 01/06/2022 10:45 AM ______________________________________________________________________   Discussed status with Dr. Dagoberto Ligas on 01/06/22 at 1054 and received approval for admission today.   Admission Coordinator:  Danne Baxter RN MSN, time 01/06/22 Date 01/06/22    Assessment/Plan: Diagnosis: Does the need for close, 24 hr/day Medical supervision in concert with the patient's rehab needs make it unreasonable for this patient to be served in a less intensive setting? Yes Co-Morbidities requiring supervision/potential complications: ESRD, old L BKA with prosthesis, calciphylaxis; orthostatic hypotension, DM, PVD Due to bowel management, safety, skin/wound care, disease management, medication administration, pain management, and patient education, does the patient require 24 hr/day rehab nursing? Yes Does the patient require coordinated care of a physician, rehab nurse, PT, OT, and SLP to address physical and functional deficits in the context of the above medical diagnosis(es)? Yes Addressing deficits in the following areas: balance, endurance, strength, transferring, bowel/bladder control, bathing, dressing, feeding, grooming, and toileting Can the patient actively participate in an intensive therapy program of at least 3 hrs of therapy 5 days a week? Yes The potential for patient to make measurable gains while on inpatient rehab is good Anticipated functional outcomes upon discharge from inpatient rehab: modified independent and supervision PT, modified independent and supervision OT, n/a SLP Estimated rehab length of stay to reach the above functional goals is: 7-10 days Anticipated discharge destination: Home 10. Overall Rehab/Functional Prognosis: good     MD Signature:           Revision History                                     Note Details  Jan Fireman, MD File Time 01/06/2022 11:24 AM  Author Type Physician Status Signed  Last Editor Courtney Heys, MD Service Physical Medicine and Rehabilitation

## 2022-01-06 NOTE — Progress Notes (Signed)
Occupational Therapy Treatment Patient Details Name: Michael Riggs MRN: [id number] DOB: 08-29-1970 Today's Date: 01/06/2022   History of present illness 51 y.o. 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   OT comments  Pt remains eager to participate and regain independence. Focused on dynamic sitting balance with UE HEP education to maximize UB strength and assist with ADLs/transfers. Pt without overt LOB during exercises in all planes with excellent carryover. Plan to continue w/c mobility during ADLs and further problem solve toilet transfer setups for pt. Continue to rec AIR level therapies as pt with excellent rehab potential.    Recommendations for follow up therapy are one component of a multi-disciplinary discharge planning process, led by the attending physician.  Recommendations may be updated based on patient status, additional functional criteria and insurance authorization.    Follow Up Recommendations  Acute inpatient rehab (3hours/day)    Assistance Recommended at Discharge Intermittent Supervision/Assistance  Patient can return home with the following  A lot of help with walking and/or transfers;A little help with bathing/dressing/bathroom;Help with stairs or ramp for entrance;Assistance with cooking/housework;Assist for transportation   Equipment Recommendations  Other (comment) (drop arm BSC)    Recommendations for Other Services Rehab consult    Precautions / Restrictions Precautions Precautions: Fall;Other (comment) Precaution Comments: multiple areas of tissue breakdown to B legs, elbows. Wound vac Splint/Cast: limb protector when OOB Restrictions Weight Bearing Restrictions: Yes RLE Weight Bearing: Non weight bearing Other Position/Activity Restrictions: Has  a L prosthetic, wears for brief periods/transfers only.       Mobility Bed Mobility Overal bed mobility: Needs Assistance Bed Mobility: Supine to Sit, Sit to Supine     Supine to sit: Supervision, HOB elevated Sit to supine: Supervision   General bed mobility comments: use of bedrails and increased effort to lift trunk but pt able to complete without assist    Transfers                   General transfer comment: pt declined to transfer to w/c OT brought to room d/t difficulty manuevering with R wheel issue     Balance Overall balance assessment: Needs assistance Sitting-balance support: No upper extremity supported Sitting balance-Leahy Scale: Fair Sitting balance - Comments: minor posterior sway with exercises sitting EOB with close supervision provided. no overt LOB Postural control: Posterior lean                                 ADL either performed or assessed with clinical judgement   ADL Overall ADL's : Needs assistance/impaired Eating/Feeding: Independent;Bed level                                     General ADL Comments: Educated on lateral leans for LB Dressing, discussed toilet transfer options (feel drop arm BSC over toilet may give pt enough space for scoot/sliding board transfers). Educated on UE HEP, discussed skin integrity importance d/t noted wounds on L residual limb that may hinder prosthetic use. Had brought wheelchair in room for pt to work with as in previous session though pt reported that w/c R wheel did not turn correctly and was difficult to manuever  Extremity/Trunk Assessment Upper Extremity Assessment Upper Extremity Assessment: Overall WFL for tasks assessed   Lower Extremity Assessment Lower Extremity Assessment: Defer to PT evaluation        Vision   Vision Assessment?: No apparent visual deficits   Perception     Praxis      Cognition Arousal/Alertness: Awake/alert Behavior During Therapy:  WFL for tasks assessed/performed Overall Cognitive Status: Within Functional Limits for tasks assessed                                          Exercises Exercises: General Upper Extremity General Exercises - Upper Extremity Shoulder Flexion: Strengthening, Both, 5 reps, Seated, Theraband Theraband Level (Shoulder Flexion): Level 3 (Green) Shoulder Horizontal ABduction: Strengthening, Both, 5 reps, Seated, Theraband Theraband Level (Shoulder Horizontal Abduction): Level 3 (Green) Elbow Flexion: Strengthening, Both, 5 reps, Seated, Theraband Theraband Level (Elbow Flexion): Level 3 (Green) Elbow Extension: Strengthening, Both, 5 reps, Seated, Theraband Theraband Level (Elbow Extension): Level 3 (Green)    Shoulder Instructions       General Comments      Pertinent Vitals/ Pain       Pain Assessment Pain Assessment: Faces Faces Pain Scale: Hurts a little bit Pain Location: R leg Pain Descriptors / Indicators: Operative site guarding, Sore Pain Intervention(s): Monitored during session  Home Living                                          Prior Functioning/Environment              Frequency  Min 2X/week        Progress Toward Goals  OT Goals(current goals can now be found in the care plan section)  Progress towards OT goals: Progressing toward goals  Acute Rehab OT Goals Patient Stated Goal: regain independence OT Goal Formulation: With patient Time For Goal Achievement: 01/16/22 Potential to Achieve Goals: Good ADL Goals Pt Will Perform Lower Body Bathing: with set-up;sitting/lateral leans Pt Will Perform Lower Body Dressing: with set-up;sitting/lateral leans Pt Will Transfer to Toilet: anterior/posterior transfer;with transfer board;bedside commode Pt/caregiver will Perform Home Exercise Program: Increased strength;Both right and left upper extremity;With theraband;With written HEP provided  Plan Discharge plan remains  appropriate    Co-evaluation                 AM-PAC OT "6 Clicks" Daily Activity     Outcome Measure   Help from another person eating meals?: None Help from another person taking care of personal grooming?: None Help from another person toileting, which includes using toliet, bedpan, or urinal?: A Lot Help from another person bathing (including washing, rinsing, drying)?: A Lot Help from another person to put on and taking off regular upper body clothing?: A Little Help from another person to put on and taking off regular lower body clothing?: A Lot 6 Click Score: 17    End of Session    OT Visit Diagnosis: Muscle weakness (generalized) (M62.81);Pain Pain - Right/Left: Right Pain - part of body: Leg   Activity Tolerance Patient tolerated treatment well   Patient Left in bed;with call bell/phone within reach   Nurse Communication          Time: 3810-1751 OT Time Calculation (min): 28 min  Charges: OT General Charges $OT Visit:  1 Visit OT Treatments $Self Care/Home Management : 8-22 mins $Therapeutic Exercise: 8-22 mins  Malachy Chamber, OTR/L Acute Rehab Services Office: [phone]   Layla Maw 01/06/2022, 11:41 AM

## 2022-01-06 NOTE — H&P (Signed)
Physical Medicine and Rehabilitation Admission H&P    CC: Acute  right below the knee amputation, prior left below the knee amputation  HPI: Michael Riggs is a 51 year old male with hx of L BKA, DM, ESRD on HD, orthostatic hypotension, PVD, Calciphylaxis, who presented to the emergency department on 12/25/2021 with worsening breakdown of right Achilles decubitus ulcer.  He has a history of peripheral vascular disease postintervention with drug-coated balloon angioplasty of the right lower extremity by Dr. Unk Lightning in March 2023.  He had undergone right Achilles debridement and placement of tissue graft by Dr. Sharol Given several days previously.  He has a history of end-stage renal disease on chronic intermittent hemodialysis and was receiving antibiotics during treatment.  He was admitted for concerns of osteomyelitis and placed on antibiotics and Dr. Sharol Given and nephrology consulted.  He was found to have progressive gangrenous changes involving the calcaneus and soft tissue envelope around the right heel.  Having failed limb salvage intervention, he recommended proceeding with transtibial amputation and he underwent right BKA on 6/16.  He tolerated the procedure well.  VAC was placed.  Hemodialysis continued on Mondays, Wednesdays and Fridays.  Antibiotics completed.  Attention noted to several skin lesions due to calciphylaxis.  Nephrology continues to manage as well as secondary hyperparathyroidism.The patient requires inpatient medicine and rehabilitation evaluations and services for ongoing dysfunction secondary to right below the knee amputation.   Pt reports phantom pain is awful- stabbing, burning, numbness- in leg that has been amputated.  Also, when puts R BKA down on bed, feels like squeezing like a melon.   LBM this AM- is anuric.  Having some bloating, but not constipated.    Review of Systems  Constitutional:  Negative for chills and fever.  HENT: Negative.    Eyes: Negative.    Respiratory:  Negative for sputum production.   Cardiovascular:  Negative for chest pain and palpitations.  Gastrointestinal:  Positive for abdominal pain. Negative for nausea and vomiting.       Bloating  Genitourinary: Negative.   Musculoskeletal:  Positive for joint pain.  Skin:        calciphylaxis  Neurological:  Positive for tingling. Negative for sensory change and headaches.  Psychiatric/Behavioral:  Positive for depression. Negative for hallucinations. The patient has insomnia. The patient is not nervous/anxious.   All other systems reviewed and are negative.  Past Medical History:  Diagnosis Date   Anemia    Anemia of chronic disease    Anxiety    ARF (acute renal failure) ([place]) 01/30/2019   Calciphylaxis    Controlled type 2 diabetes mellitus with hyperglycemia, without long-term current use of insulin ([place])    Debility 02/09/2019   Depression    Diabetes mellitus type 2 in obese ([place]) 07/04/2013   Diabetes mellitus with peripheral vascular disease ([place])    type 2 no meds, lost 100 lbs   Dyslipidemia    Dyspnea    inhaler   End stage renal disease ([place])    ESRD (end stage renal disease) on dialysis ([place]) 01/2019   MWFS   ESRD on dialysis [facility])    Essential hypertension    Fluid overload 02/04/2019   GERD (gastroesophageal reflux disease)    Goals of care, counseling/discussion    Habitual alcohol use 07/04/2013   Headache    Hyperkalemia 01/30/2019   Hypertension    Hypoglycemia 01/30/2019   Hypokalemia 01/30/2019   Labile blood pressure    Leukocytosis    Lower GI bleed  05/14/2019   Macrocytic anemia 01/30/2019   Obesity, Class II, BMI 35-39.9, with comorbidity 07/04/2013   Palliative care encounter    PDR (proliferative diabetic retinopathy) ([place]) 12/14/2013   Physical deconditioning    Pressure injury of skin 04/27/2019   Pruritus    Scrotal edema    Scrotal pain    Sleep apnea    uses CPAP   Sleep disturbance    Slow transit constipation    Status  post below-knee amputation of left lower extremity ([place])    Stroke ([name])    mini -shown on CT scan   Syphilis 01/2019   Urinary retention    Venous hypertension 09/22/2013   Wound infection 03/28/2019   Past Surgical History:  Procedure Laterality Date   ABDOMINAL AORTOGRAM W/LOWER EXTREMITY N/A 10/15/2021   Procedure: ABDOMINAL AORTOGRAM W/LOWER EXTREMITY;  Surgeon: Broadus John, MD;  Location: [facility];  Service: Cardiovascular;  Laterality: N/A;   AMPUTATION Right 01/02/2022   Procedure: RIGHT BELOW KNEE AMPUTATION;  Surgeon: [name], MD;  Location: [facility];  Service: Orthopedics;  Laterality: Right;   AV FISTULA PLACEMENT Left 01/09/2019   Procedure: ARTERIOVENOUS (AV) FISTULA CREATION LEFT UPPER ARM;  Surgeon: [name], MD;  Location: [facility];  Service: Vascular;  Laterality: Left;   [place] Left 07/06/2019   Procedure: BASILIC VEIN TRANSPOSITION SECOND STAGE LEFT ARM;  Surgeon: [name], MD;  Location: [facility];  Service: Vascular;  Laterality: Left;   below the knee amputation Left    EYE SURGERY Bilateral    FLEXIBLE SIGMOIDOSCOPY N/A 05/15/2019   Procedure: FLEXIBLE SIGMOIDOSCOPY;  Surgeon: Irving Copas., MD;  Location: [facility];  Service: Gastroenterology;  Laterality: N/A;   HEMOSTASIS CLIP PLACEMENT  05/15/2019   Procedure: HEMOSTASIS CLIP PLACEMENT;  Surgeon: Irving Copas., MD;  Location: [facility];  Service: Gastroenterology;;   HOT HEMOSTASIS N/A 05/15/2019   Procedure: HOT HEMOSTASIS (ARGON PLASMA COAGULATION/BICAP);  Surgeon: Irving Copas., MD;  Location: [facility];  Service: Gastroenterology;  Laterality: N/A;   I & D EXTREMITY Right 10/10/2021   Procedure: RIGHT ACHILLES DEBRIDEMENT AND TISSUE GRAFT;  Surgeon: [name], MD;  Location: [facility];  Service: Orthopedics;  Laterality: Right;   IR FLUORO GUIDE CV LINE RIGHT  01/31/2019   IR FLUORO GUIDE CV LINE RIGHT  02/03/2019   IR THORACENTESIS  ASP PLEURAL SPACE W/IMG GUIDE  11/08/2019   IR US GUIDE VASC ACCESS RIGHT  01/31/2019   IR US GUIDE VASC ACCESS RIGHT  02/03/2019   PERIPHERAL VASCULAR BALLOON ANGIOPLASTY  10/15/2021   Procedure: PERIPHERAL VASCULAR BALLOON ANGIOPLASTY;  Surgeon: Broadus John, MD;  Location: [facility];  Service: Cardiovascular;;  rt sfa and trifurcation   SCLEROTHERAPY  05/15/2019   Procedure: SCLEROTHERAPY;  Surgeon: Mansouraty, Telford Nab., MD;  Location: [facility];  Service: Gastroenterology;;   Family History  Problem Relation Age of Onset   Diabetes Mother    Multiple myeloma Mother    Social History:  reports that he quit smoking about 2 years ago. His smoking use included cigarettes. He started smoking about 3 years ago. He has never been exposed to tobacco smoke. He has never used smokeless tobacco. He reports that he does not currently use alcohol. He reports that he does not currently use drugs. Allergies:  Allergies  Allergen Reactions   Tape Itching    Prefers silk tape over regular tape   Medications Prior to Admission  Medication Sig Dispense Refill   acetaminophen (TYLENOL) 500 MG tablet Take 1,000 mg by mouth every 6 (six) hours as needed for mild pain.     albuterol (VENTOLIN HFA) 108 (90 Base) MCG/ACT inhaler Inhale 2 puffs into the lungs every 6 (six) hours as needed for wheezing or shortness of breath.     amLODipine (NORVASC) 5 MG tablet Take 5 mg by mouth at bedtime.      aspirin 81 MG EC tablet Take 1 tablet (81 mg total) by mouth daily. Swallow whole. 30 tablet 11   atorvastatin (LIPITOR) 40 MG tablet Take 1 tablet (40 mg total) by mouth daily. (Patient taking differently: Take 40 mg by mouth at bedtime.)     bromocriptine (PARLODEL) 2.5 MG tablet TAKE 1 TABLET BY MOUTH AT BEDTIME. (Patient taking differently: Take 2.5 mg by mouth at bedtime.) 90 tablet 1   buPROPion (WELLBUTRIN XL) 300 MG 24 hr tablet Take 1 tablet (300 mg total) by mouth in the morning.      calcitRIOL (ROCALTROL) 0.5 MCG capsule Take 4 capsules (2 mcg total) by mouth every Monday, Wednesday, and Friday with hemodialysis.     clopidogrel (PLAVIX) 75 MG tablet Take 1 tablet (75 mg total) by mouth daily with breakfast. 30 tablet 2   DULoxetine (CYMBALTA) 20 MG capsule Take 20 mg by mouth 2 (two) times daily.     ferric citrate (AURYXIA) 1 GM 210 MG(Fe) tablet Take 630 mg by mouth 3 (three) times daily with meals.     HYDROmorphone (DILAUDID) 4 MG tablet Take 4 mg by mouth every 8 (eight) hours as needed for moderate pain.     ipratropium-albuterol (DUONEB) 0.5-2.5 (3) MG/3ML SOLN Take 3 mLs by nebulization every 6 (six) hours as needed (For shortness of breath).     Melatonin 10 MG TABS Take 10 mg by mouth at bedtime.     metoprolol tartrate (LOPRESSOR) 25 MG tablet Take 1 tablet (25 mg total) by mouth 2 (two) times daily. 60 tablet 0   midodrine (PROAMATINE) 10 MG tablet Take 10 mg by mouth every Monday, Wednesday, and Friday.     ondansetron (ZOFRAN) 4 MG tablet Take 4 mg by mouth daily as needed for vomiting.     pantoprazole (PROTONIX) 40 MG tablet Take 1 tablet (40 mg total) by mouth daily. (Patient taking differently: Take 40 mg by mouth at bedtime.)     silver sulfADIAZINE (SILVADENE) 1 % cream Apply 1 application. topically daily. Apply to affected area daily plus dry dressing 400 g 3   tamsulosin (FLOMAX) 0.4 MG CAPS capsule Take 1 capsule (0.4 mg total) by mouth daily after breakfast. (Patient taking differently: Take 0.4 mg by mouth at bedtime.) 30 capsule    vitamin C (ASCORBIC ACID) 500 MG tablet Take 500 mg by mouth at bedtime.        Home: Home Living Family/patient expects to be discharged to:: Private residence Living Arrangements: Parent, Other relatives (brother and sister-in-law) Available Help at Discharge: Family, Available PRN/intermittently (mother and sister-in-law there 24/7 but not able to help pt) Type of Home: Apartment Home Access: Level entry Home  Layout: One level Bathroom Shower/Tub: Chiropodist: Standard Bathroom Accessibility: Yes Home Equipment: Hand held shower head, Tub bench, Wheelchair - manual, Cane - single point, Hospital bed Additional Comments: brother also lives with pt and provides assist as needed.  Lives With: Family   Functional History: Prior Function Prior Level of Function : Independent/Modified Independent Mobility Comments: uses  w/c in apartment to go to bathroom, prosthetic and cane to go short distance. ADLs Comments: Brother mainly cooks but he can if he wants to. Does not work, brother assists with community mobility.  Showers.  Functional Status:  Mobility: Bed Mobility Overal bed mobility: Needs Assistance Bed Mobility: Supine to Sit, Sit to Supine Supine to sit: Supervision, HOB elevated Sit to supine: Supervision General bed mobility comments: use of bedrails and increased effort to lift trunk but pt able to complete without assist Transfers Overall transfer level: Needs assistance Equipment used: Sliding board Transfers: Bed to chair/wheelchair/BSC Bed to/from chair/wheelchair/BSC transfer type:: Lateral/scoot transfer, Anterior-posterior transfer  Lateral/Scoot Transfers: Mod assist, +2 physical assistance, +2 safety/equipment (3rd person to steady WC) General transfer comment: pt declined to transfer to w/c OT brought to room d/t difficulty manuevering with R wheel issue   Wheelchair Mobility Wheelchair mobility: Yes Wheelchair propulsion: Left upper extremity Wheelchair parts: Supervision/cueing Distance: 82f Wheelchair Assistance Details (indicate cue type and reason): to and from bathroom sink to perform self care, cueing required to lock brakes  ADL: ADL Overall ADL's : Needs assistance/impaired Eating/Feeding: Independent, Bed level Grooming: Wash/dry hands, Wash/dry face, Set up, Bed level Upper Body Bathing: Set up, Bed level Lower Body Bathing: Minimal  assistance, Bed level Upper Body Dressing : Set up, Bed level Lower Body Dressing: Moderate assistance, Bed level Toilet Transfer Details (indicate cue type and reason): No drop arm in room.  Able to perform lateral scoot with a lot of momentum and Min Guard. General ADL Comments: Educated on lateral leans for LB Dressing, discussed toilet transfer options (feel drop arm BSC over toilet may give pt enough space for scoot/sliding board transfers). Educated on UE HEP, discussed skin integrity importance d/t noted wounds on L residual limb that may hinder prosthetic use. Had brought wheelchair in room for pt to work with as in previous session though pt reported that w/c R wheel did not turn correctly and was difficult to manuever  Cognition: Cognition Overall Cognitive Status: Within Functional Limits for tasks assessed Orientation Level: Oriented X4 Cognition Arousal/Alertness: Awake/alert Behavior During Therapy: WFL for tasks assessed/performed Overall Cognitive Status: Within Functional Limits for tasks assessed  Physical Exam: Blood pressure 113/80, pulse 60, temperature 98 F (36.7 C), temperature source Oral, resp. rate 18, height _0  (1.778 m), weight 103.3 kg, SpO2 97 %. Physical Exam Vitals and nursing note reviewed.  Constitutional:      Comments: Pt sitting up in bed; food just delivered; wearing Eyeglasses; said iphone went missing while in bathroom; Wound VAC on R BKA, NAD  HENT:     Head: Normocephalic and atraumatic.     Right Ear: External ear normal.     Left Ear: External ear normal.     Nose: Nose normal. No congestion.     Mouth/Throat:     Mouth: Mucous membranes are dry.     Pharynx: Oropharynx is clear. No oropharyngeal exudate.  Eyes:     General:        Right eye: No discharge.        Left eye: No discharge.     Extraocular Movements: Extraocular movements intact.  Cardiovascular:     Rate and Rhythm: Normal rate and regular rhythm.     Heart sounds:  Normal heart sounds. No murmur heard.    No gallop.  Abdominal:     Comments: Slightly protuberant vs distended; soft, NT, (+) normoactive BS  Musculoskeletal:     Cervical back: Neck  supple. No tenderness.     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     Comments: Decreased to light touch from knees down B/L  Psychiatric:     Comments: Flat, but interactive     Results for orders placed or performed during the hospital encounter of 12/25/21 (from the past 48 hour(s))  Glucose, capillary     Status: Abnormal   Collection Time: 01/04/22 12:50 PM  Result Value Ref Range   Glucose-Capillary 278 (H) 70 - 99 mg/dL    Comment: Glucose reference range applies only to samples taken after fasting for at least 8 hours.  Glucose, capillary     Status: Abnormal   Collection Time: 01/04/22  5:35 PM  Result Value Ref Range   Glucose-Capillary 138 (H) 70 - 99 mg/dL    Comment: Glucose reference range applies only to samples taken after fasting for at least 8 hours.  Glucose, capillary     Status: Abnormal   Collection Time: 01/04/22  9:07 PM  Result Value Ref Range   Glucose-Capillary 177 (H) 70 - 99 mg/dL    Comment: Glucose reference range applies only to samples taken after fasting for at least 8 hours.  Glucose, capillary     Status: Abnormal   Collection Time: 01/05/22  7:54 AM  Result Value Ref Range   Glucose-Capillary 103 (H) 70 - 99 mg/dL    Comment: Glucose reference range applies only to samples taken after fasting for at least  8 hours.  Renal function panel     Status: Abnormal   Collection Time: 01/05/22  8:25 AM  Result Value Ref Range   Sodium 130 (L) 135 - 145 mmol/L   Potassium 5.0 3.5 - 5.1 mmol/L   Chloride 89 (L) 98 - 111 mmol/L   CO2 20 (L) 22 - 32 mmol/L   Glucose, Bld 133 (H) 70 - 99 mg/dL    Comment: Glucose reference range applies only to samples taken after fasting for at least 8 hours.   BUN 102 (H) 6 - 20 mg/dL   Creatinine, Ser 8.11 (H) 0.61 - 1.24 mg/dL   Calcium 9.1 8.9 - 10.3 mg/dL   Phosphorus 6.3 (H) 2.5 - 4.6 mg/dL   Albumin 2.6 (L) 3.5 - 5.0 g/dL   GFR, Estimated 7 (L) >60 mL/min    Comment: (NOTE) Calculated using the CKD-EPI Creatinine Equation (2021)    Anion gap 21 (H) 5 - 15    Comment: Electrolytes repeated to confirm. Performed at [facility], [place] [address], [place], [place] [address]   CBC     Status: Abnormal   Collection Time: 01/05/22  8:25 AM  Result Value Ref Range   WBC 13.0 (H) 4.0 - 10.5 K/uL   RBC 3.23 (L) 4.22 - 5.81 MIL/uL   Hemoglobin 11.3 (L) 13.0 - 17.0 g/dL   HCT 33.9 (L) 39.0 - 52.0 %   MCV 105.0 (H) 80.0 -  100.0 fL   MCH 35.0 (H) 26.0 - 34.0 pg   MCHC 33.3 30.0 - 36.0 g/dL   RDW 15.8 (H) 11.5 - 15.5 %   Platelets 239 150 - 400 K/uL   nRBC 0.0 0.0 - 0.2 %    Comment: Performed at [facility] [address] [place], [place] [address]  Glucose, capillary     Status: Abnormal   Collection Time: 01/05/22  2:59 PM  Result Value Ref Range   Glucose-Capillary 142 (H) 70 - 99 mg/dL    Comment: Glucose reference range applies only to samples taken after fasting for at least 8 hours.  Glucose, capillary     Status: Abnormal   Collection Time: 01/05/22  3:41 PM  Result Value Ref Range   Glucose-Capillary 127 (H) 70 - 99 mg/dL    Comment: Glucose reference range applies only to samples taken after fasting for at least 8 hours.  Glucose, capillary     Status: Abnormal   Collection Time: 01/05/22 10:06 PM  Result Value Ref Range    Glucose-Capillary 178 (H) 70 - 99 mg/dL    Comment: Glucose reference range applies only to samples taken after fasting for at least 8 hours.  Glucose, capillary     Status: Abnormal   Collection Time: 01/06/22  8:06 AM  Result Value Ref Range   Glucose-Capillary 126 (H) 70 - 99 mg/dL    Comment: Glucose reference range applies only to samples taken after fasting for at least 8 hours.  Glucose, capillary     Status: Abnormal   Collection Time: 01/06/22 11:45 AM  Result Value Ref Range   Glucose-Capillary 125 (H) 70 - 99 mg/dL    Comment: Glucose reference range applies only to samples taken after fasting for at least 8 hours.   No results found.    Blood pressure 113/80, pulse 60, temperature 98 F (36.7 C), temperature source Oral, resp. rate 18, height _0  (1.778 m), weight 103.3 kg, SpO2 97 %.  Medical Problem List and Plan: 1. Functional deficits secondary to right below the knee amputation, ESRD on HD.  -patient may not shower until VAC removed  -ELOS/Goals: 7-10 days w/c level supervision to mod I 2.  Antithrombotics: -DVT/anticoagulation:  Pharmaceutical: Heparin  -antiplatelet therapy: Plavix and aspirin 3. Pain Management: Significant phantom pain  --will continue oxycodone 15 mg q 4 hours and consider methadone 2.5 mg in place of IV Dilaudid  -suggest methadone 2,5 mg BID since helpful for nerve pain esp in ESRD pt- and stop IV Dilaudid 4. Mood/Sleep: LCSW to evaluate and provide emotional support  -antipsychotic agents: n/a  -continue melatonin q HS  -continue Cymbalta DR 20 mg BID  -continue Wellbutrin XL 300 mg q AM 5. Neuropsych/cognition: This patient is capable of making decisions on his own behalf. 6. Skin/Wound Care: Routine skin care checks  --Wound VAC to surgical incision; plan to remove Friday  --monitor calciphylaxis skin lesions 7. Fluids/Electrolytes/Nutrition: Strict Is and Os  --HD/per nephrology 8: ESRD: HD on  Monday/Wednesday/Friday   --chronic hypotension on midodrine pre-HD 9: Essential hypertension: continue metoprolol 25 mg BID  -- Amlodipine discontinued 10: Peripheral arterial disease s/p intervention  --maintained on aspirin, Plavix and statin 11: DM-2: continue CBGs q AC/HS  --SSI, Levemir 5 units daily  --home regimen: none/diet controlled (?) 12: Calciphylaxis: management per nephrology  --monitor left lower extremity and right 2nd finger lesions 13: BPH: continue Flomax 14: Anemia of ESRD: continue ferric citrate and follow-up  CBC 15: Abdominal bloating/dyspepsia: PPI  I have personally performed a face to face diagnostic evaluation of this patient and formulated the key components of the plan.  Additionally, I have personally reviewed laboratory data, imaging studies, as well as relevant notes and concur with the physician assistant's documentation above.   The patient's status has not changed from the original H&P.  Any changes in documentation from the acute care chart have been noted above.     Barbie Banner, PA-C 01/06/2022

## 2022-01-07 DIAGNOSIS — N186 End stage renal disease: Secondary | ICD-10-CM

## 2022-01-07 DIAGNOSIS — G8918 Other acute postprocedural pain: Secondary | ICD-10-CM

## 2022-01-07 DIAGNOSIS — G894 Chronic pain syndrome: Secondary | ICD-10-CM

## 2022-01-07 LAB — CBC
HCT: 33.9 % — ABNORMAL LOW (ref 39.0–52.0)
Hemoglobin: 11.1 g/dL — ABNORMAL LOW (ref 13.0–17.0)
MCH: 34 pg (ref 26.0–34.0)
MCHC: 32.7 g/dL (ref 30.0–36.0)
MCV: 104 fL — ABNORMAL HIGH (ref 80.0–100.0)
Platelets: 215 10*3/uL (ref 150–400)
RBC: 3.26 MIL/uL — ABNORMAL LOW (ref 4.22–5.81)
RDW: 16.3 % — ABNORMAL HIGH (ref 11.5–15.5)
WBC: 13.4 10*3/uL — ABNORMAL HIGH (ref 4.0–10.5)
nRBC: 0 % (ref 0.0–0.2)

## 2022-01-07 LAB — GLUCOSE, CAPILLARY
Glucose-Capillary: 76 mg/dL (ref 70–99)
Glucose-Capillary: 114 mg/dL — ABNORMAL HIGH (ref 70–99)
Glucose-Capillary: 108 mg/dL — ABNORMAL HIGH (ref 70–99)

## 2022-01-07 LAB — RENAL FUNCTION PANEL
Albumin: 2.7 g/dL — ABNORMAL LOW (ref 3.5–5.0)
Anion gap: 24 — ABNORMAL HIGH (ref 5–15)
BUN: 76 mg/dL — ABNORMAL HIGH (ref 6–20)
CO2: 22 mmol/L (ref 22–32)
Calcium: 9.7 mg/dL (ref 8.9–10.3)
Chloride: 89 mmol/L — ABNORMAL LOW (ref 98–111)
Creatinine, Ser: 7.86 mg/dL — ABNORMAL HIGH (ref 0.61–1.24)
GFR, Estimated: 8 mL/min — ABNORMAL LOW (ref >60)
Glucose, Bld: 126 mg/dL — ABNORMAL HIGH (ref 70–99)
Phosphorus: 4.7 mg/dL — ABNORMAL HIGH (ref 2.5–4.6)
Potassium: 5.3 mmol/L — ABNORMAL HIGH (ref 3.5–5.1)
Sodium: 135 mmol/L (ref 135–145)

## 2022-01-07 MED ORDER — LIDOCAINE-PRILOCAINE 2.5-2.5 % EX CREA: 1.0000 | TOPICAL_CREAM | CUTANEOUS | Status: DC | PRN

## 2022-01-07 MED ORDER — VITAMIN D (ERGOCALCIFEROL) 1.25 MG (50000 UNIT) PO CAPS
50000.0000 [IU] | ORAL_CAPSULE | ORAL | Status: DC
  Administered 2022-01-07: 50000 [IU] via ORAL
  Filled 2022-01-07: qty 1

## 2022-01-07 MED ORDER — INSULIN DETEMIR 100 UNIT/ML ~~LOC~~ SOLN
4.0000 [IU] | Freq: Every day | SUBCUTANEOUS | Status: DC
  Administered 2022-01-08 – 2022-01-12 (×5): 4 [IU] via SUBCUTANEOUS
  Filled 2022-01-07 (×5): qty 0.04

## 2022-01-07 MED ORDER — LIDOCAINE HCL (PF) 1 % IJ SOLN: 5.0000 mL | INTRAMUSCULAR | Status: DC | PRN

## 2022-01-07 MED ORDER — PENTAFLUOROPROP-TETRAFLUOROETH EX AERO: 1.0000 | INHALATION_SPRAY | CUTANEOUS | Status: DC | PRN

## 2022-01-07 MED ORDER — CHLORHEXIDINE GLUCONATE CLOTH 2 % EX PADS
6.0000 | MEDICATED_PAD | Freq: Every day | CUTANEOUS | Status: DC
  Administered 2022-01-08 – 2022-01-11 (×3): 6 via TOPICAL

## 2022-01-07 MED ORDER — HYDROMORPHONE HCL 2 MG PO TABS
4.0000 mg | ORAL_TABLET | ORAL | Status: DC | PRN
  Administered 2022-01-07 – 2022-01-14 (×21): 4 mg via ORAL
  Filled 2022-01-07 (×21): qty 2

## 2022-01-07 NOTE — Evaluation (Signed)
Occupational Therapy Assessment and Plan  Patient Details  Name: Michael Riggs MRN: [id number] Date of Birth: July 20, 1971  OT Diagnosis: acute pain, cognitive deficits, muscle weakness (generalized), and swelling of limb Rehab Potential: Rehab Potential (ACUTE ONLY): Good ELOS: 7-10 days   Today's Date: 01/07/2022 OT Individual Time: 0900-1010 OT Individual Time Calculation (min): 70 min     Hospital Problem: Principal Problem:   Below-knee amputation of right lower extremity ([place]) Active Problems:   ESRD (end stage renal disease) ([place])   Status post below-knee amputation of left lower extremity ([place])   Past Medical History:  Past Medical History:  Diagnosis Date   Anemia    Anemia of chronic disease    Anxiety    ARF (acute renal failure) ([place]) 01/30/2019   Calciphylaxis    Controlled type 2 diabetes mellitus with hyperglycemia, without long-term current use of insulin ([place])    Debility 02/09/2019   Depression    Diabetes mellitus type 2 in obese ([place]) 07/04/2013   Diabetes mellitus with peripheral vascular disease ([place])    type 2 no meds, lost 100 lbs   Dyslipidemia    Dyspnea    inhaler   End stage renal disease ([place])    ESRD (end stage renal disease) on dialysis ([place]) 01/2019   MWFS   ESRD on dialysis [facility])    Essential hypertension    Fluid overload 02/04/2019   GERD (gastroesophageal reflux disease)    Goals of care, counseling/discussion    Habitual alcohol use 07/04/2013   Headache    Hyperkalemia 01/30/2019   Hypertension    Hypoglycemia 01/30/2019   Hypokalemia 01/30/2019   Labile blood pressure    Leukocytosis    Lower GI bleed 05/14/2019   Macrocytic anemia 01/30/2019   Obesity, Class II, BMI 35-39.9, with comorbidity 07/04/2013   Palliative care encounter    PDR (proliferative diabetic retinopathy) ([place]) 12/14/2013   Physical deconditioning    Pressure injury of skin 04/27/2019   Pruritus    Scrotal edema    Scrotal pain    Sleep apnea    uses  CPAP   Sleep disturbance    Slow transit constipation    Status post below-knee amputation of left lower extremity ([place])    Stroke ([place])    mini -shown on CT scan   Syphilis 01/2019   Urinary retention    Venous hypertension 09/22/2013   Wound infection 03/28/2019   Past Surgical History:  Past Surgical History:  Procedure Laterality Date   ABDOMINAL AORTOGRAM W/LOWER EXTREMITY N/A 10/15/2021   Procedure: ABDOMINAL AORTOGRAM W/LOWER EXTREMITY;  Surgeon: Broadus John, MD;  Location: [facility];  Service: Cardiovascular;  Laterality: N/A;   AMPUTATION Right 01/02/2022   Procedure: RIGHT BELOW KNEE AMPUTATION;  Surgeon: [name], MD;  Location: [place];  Service: Orthopedics;  Laterality: Right;   AV FISTULA PLACEMENT Left 01/09/2019   Procedure: ARTERIOVENOUS (AV) FISTULA CREATION LEFT UPPER ARM;  Surgeon: [name], MD;  Location: [facility];  Service: Vascular;  Laterality: Left;   [place] Left 07/06/2019   Procedure: BASILIC VEIN TRANSPOSITION SECOND STAGE LEFT ARM;  Surgeon: [name], MD;  Location: [place];  Service: Vascular;  Laterality: Left;   below the knee amputation Left    EYE SURGERY Bilateral    FLEXIBLE SIGMOIDOSCOPY N/A 05/15/2019   Procedure: FLEXIBLE SIGMOIDOSCOPY;  Surgeon: Irving Copas., MD;  Location: [place];  Service: Gastroenterology;  Laterality: N/A;   HEMOSTASIS CLIP  PLACEMENT  05/15/2019   Procedure: HEMOSTASIS CLIP PLACEMENT;  Surgeon: Irving Copas., MD;  Location: [place];  Service: Gastroenterology;;   HOT HEMOSTASIS N/A 05/15/2019   Procedure: HOT HEMOSTASIS (ARGON PLASMA COAGULATION/BICAP);  Surgeon: Irving Copas., MD;  Location: [place];  Service: Gastroenterology;  Laterality: N/A;   I & D EXTREMITY Right 10/10/2021   Procedure: RIGHT ACHILLES DEBRIDEMENT AND TISSUE GRAFT;  Surgeon: [name], MD;  Location: [place];  Service: Orthopedics;  Laterality: Right;   IR FLUORO  GUIDE CV LINE RIGHT  01/31/2019   IR FLUORO GUIDE CV LINE RIGHT  02/03/2019   IR THORACENTESIS ASP PLEURAL SPACE W/IMG GUIDE  11/08/2019   IR US GUIDE VASC ACCESS RIGHT  01/31/2019   IR US GUIDE VASC ACCESS RIGHT  02/03/2019   PERIPHERAL VASCULAR BALLOON ANGIOPLASTY  10/15/2021   Procedure: PERIPHERAL VASCULAR BALLOON ANGIOPLASTY;  Surgeon: Broadus John, MD;  Location: [facility];  Service: Cardiovascular;;  rt sfa and trifurcation   SCLEROTHERAPY  05/15/2019   Procedure: SCLEROTHERAPY;  Surgeon: Mansouraty, Telford Nab., MD;  Location: [place];  Service: Gastroenterology;;    Assessment & Plan Clinical Impression:  Michael Riggs is a 51 year old male with hx of L BKA, DM, ESRD on HD, orthostatic hypotension, PVD, Calciphylaxis, who presented to the emergency department on 12/25/2021 with worsening breakdown of right Achilles decubitus ulcer.  He has a history of peripheral vascular disease postintervention with drug-coated balloon angioplasty of the right lower extremity by Dr. Unk Lightning in March 2023.  He had undergone right Achilles debridement and placement of tissue graft by Dr. Sharol Given several days previously.  He has a history of end-stage renal disease on chronic intermittent hemodialysis and was receiving antibiotics during treatment.  He was admitted for concerns of osteomyelitis and placed on antibiotics and Dr. Sharol Given and nephrology consulted.  He was found to have progressive gangrenous changes involving the calcaneus and soft tissue envelope around the right heel.  Having failed limb salvage intervention, he recommended proceeding with transtibial amputation and he underwent right BKA on 6/16.  He tolerated the procedure well.  VAC was placed.  Hemodialysis continued on Mondays, Wednesdays and Fridays.  Antibiotics completed.  Attention noted to several skin lesions due to calciphylaxis.  Nephrology continues to manage as well as secondary hyperparathyroidism.The patient requires  inpatient medicine and rehabilitation evaluations and services for ongoing dysfunction secondary to right below the knee amputation. Patient transferred to CIR on 01/06/2022 .    Patient currently requires mod with basic self-care skills secondary to muscle weakness, decreased cardiorespiratoy endurance, abnormal tone and decreased coordination, decreased memory, and decreased sitting balance.  Prior to hospitalization, patient could complete all self-care and functional transfers with independence to mod I.  Patient will benefit from skilled intervention to decrease level of assist with basic self-care skills and increase independence with basic self-care skills prior to discharge home with care partner.  Anticipate patient will require intermittent supervision and follow up home health.  OT - End of Session Activity Tolerance: Tolerates 10 - 20 min activity with multiple rests Endurance Deficit: Yes Endurance Deficit Description: required rest breaks between transfers OT Assessment Rehab Potential (ACUTE ONLY): Good OT Barriers to Discharge: Lack of/limited family support;Home environment access/layout;Wound Care;Hemodialysis;Other (comments) OT Barriers to Discharge Comments: high pain levels OT Patient demonstrates impairments in the following area(s): Balance;Cognition;Edema;Endurance;Motor;Pain;Safety OT Basic ADL's Functional Problem(s): Bathing;Dressing;Toileting OT Transfers Functional Problem(s): Toilet;Tub/Shower OT Additional Impairment(s): None OT Plan OT Intensity: Minimum of 1-2 x/day,  45 to 90 minutes OT Frequency: 5 out of 7 days OT Duration/Estimated Length of Stay: 7-10 days OT Treatment/Interventions: Medical illustrator training;Patient/family education;Therapeutic Activities;Wheelchair propulsion/positioning;DME/adaptive equipment instruction;Cognitive remediation/compensation;Therapeutic Exercise;Functional mobility training;Self Care/advanced ADL retraining;UE/LE Strength  taining/ROM;Discharge planning;Skin care/wound managment;UE/LE Coordination activities;Disease mangement/prevention;Pain management OT Self Feeding Anticipated Outcome(s): indep OT Basic Self-Care Anticipated Outcome(s): Mod I-supervision OT Toileting Anticipated Outcome(s): Mod I OT Bathroom Transfers Anticipated Outcome(s): Supervision OT Recommendation Patient destination: Home Follow Up Recommendations: Home health OT Equipment Recommended: 3 in 1 bedside comode;To be determined Equipment Details: anticipate pt will need drop arm bariatric BSC for toilet transfers secondary to slideboard transfers requirement and safety concern with pt's standard [facility]   OT Evaluation Precautions/Restrictions  Precautions Precautions: Fall;Other (comment) Precaution Comments: multiple areas of tissue breakdown to B legs, elbows. Wound vac Required Braces or Orthoses: Splint/Cast Splint/Cast: limb protector when OOB Restrictions Weight Bearing Restrictions: Yes RLE Weight Bearing: Non weight bearing LLE Weight Bearing: Weight bearing as tolerated Other Position/Activity Restrictions: Has a L prosthetic, wears for brief periods/transfers only. Home Living/Prior Functioning Home Living Living Arrangements: Parent Available Help at Discharge: Family, Available PRN/intermittently (brother works night shift, mother and sister in law unable to assist) Type of Home: Apartment Home Access: Level entry Home Layout: One level Bathroom Shower/Tub: Tub/shower unit, Architectural technologist: Standard Bathroom Accessibility: Yes Additional Comments: used TTB to bathe in tub/shower at baseline. has manual WC and RW. Pt reports being ambulatory with SPC and L prosthetic. Would walk into [employer] and then use motorized scooter.  Lives With: Family (brother, sister in law, niece and mother) IADL History Homemaking Responsibilities: No Current License: No Occupation: Unemployed Prior Function Level of  Independence: Requires assistive device for independence, Independent with basic ADLs, Independent with transfers, Independent with gait  Able to Take Stairs?: Yes Driving: No (brother drives him) Vocation: Unemployed Vision Baseline Vision/History: 1 Wears glasses (PRN) Ability to See in Adequate Light: 0 Adequate Patient Visual Report: No change from baseline Vision Assessment?: No apparent visual deficits Additional Comments: pt reported initially having double vision but has since resolved day after surgery Perception  Perception: Within Functional Limits Praxis Praxis: Intact Cognition Cognition Overall Cognitive Status: Within Functional Limits for tasks assessed Arousal/Alertness: Awake/alert Orientation Level: Person;Place;Situation Person: Oriented Place: Oriented Situation: Oriented Memory: Appears intact Awareness: Appears intact Problem Solving: Appears intact Safety/Judgment: Appears intact Brief Interview for Mental Status (BIMS) Repetition of Three Words (First Attempt): 3 Temporal Orientation: Year: Correct Temporal Orientation: Month: Accurate within 5 days Temporal Orientation: Day: Correct Recall: "Sock": Yes, no cue required Recall: "Blue": Yes, no cue required Recall: "Bed": Yes, no cue required BIMS Summary Score: 15 Sensation Sensation Light Touch: Appears Intact Proprioception: Impaired by gross assessment Additional Comments: pt reports "muscle pain" in R residual limb and "ghost pain" in L residual limb Coordination Gross Motor Movements are Fluid and Coordinated: No Fine Motor Movements are Fluid and Coordinated: No Finger Nose Finger Test: dysmetria and slower RUE>LUE Heel Shin Test: unable to perform due to bilateral BKA but hip flexibility appears [facility] Motor  Motor Motor: Abnormal postural alignment and control Motor - Skilled Clinical Observations: 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, decreased balance/postural control, pain, and  generalized weakness/deconditioning  Trunk/Postural Assessment  Cervical Assessment Cervical Assessment: Within Functional Limits Thoracic Assessment Thoracic Assessment: Exceptions to [facility] (throacic rounding) Lumbar Assessment Lumbar Assessment: Exceptions to [facility] (posterior pelvic tilt in sitting) Postural Control Postural Control: Deficits on evaluation Trunk Control: delayed  Balance Balance Balance Assessed: Yes Static Sitting Balance Static  Sitting - Balance Support: Bilateral upper extremity supported;Feet unsupported Static Sitting - Level of Assistance: 6: Modified independent (Device/Increase time) Static Sitting - Comment/# of Minutes: sitting EOB upon therapist's inital entry Dynamic Sitting Balance Dynamic Sitting - Balance Support: No upper extremity supported;Feet unsupported Dynamic Sitting - Level of Assistance: 5: Stand by assistance (CGA) Extremity/Trunk Assessment RUE Assessment RUE Assessment: Within Functional Limits LUE Assessment LUE Assessment: Within Functional Limits  Care Tool Care Tool Self Care Eating        Oral Care    Oral Care Assist Level: Set up assist    Bathing   Body parts bathed by patient: Right arm;Left arm;Chest;Abdomen;Right upper leg;Left upper leg;Face Body parts bathed by helper: Front perineal area;Buttocks Body parts n/a: Right lower leg;Left lower leg (bilateral BKA) Assist Level: Minimal Assistance - Patient > 75%    Upper Body Dressing(including orthotics)   What is the patient wearing?: Pull over shirt   Assist Level: Set up assist    Lower Body Dressing (excluding footwear)   What is the patient wearing?: Underwear/pull up;Pants Assist for lower body dressing: Moderate Assistance - Patient 50 - 74%    Putting on/Taking off footwear Putting on/taking off footwear activity did not occur: Refused (pain) What is the patient wearing?: Orthosis (LLE prosthetic)         Care Tool Toileting Toileting activity  Toileting Activity did not occur (Clothing management and hygiene only): Refused       Care Tool Bed Mobility Roll left and right activity   Roll left and right assist level: Moderate Assistance - Patient 50 - 74%    Sit to lying activity        Lying to sitting on side of bed activity   Lying to sitting on side of bed assist level: the ability to move from lying on the back to sitting on the side of the bed with no back support.: Moderate Assistance - Patient 50 - 74%     Care Tool Transfers Sit to stand transfer Sit to stand activity did not occur: N/A      Chair/bed transfer Chair/bed transfer activity did not occur: N/A       Toilet transfer   Assist Level: Minimal Assistance - Patient > 75% (slideboard)     Care Tool Cognition  Expression of Ideas and Wants Expression of Ideas and Wants: 4. Without difficulty (complex and basic) - expresses complex messages without difficulty and with speech that is clear and easy to understand  Understanding Verbal and Non-Verbal Content Understanding Verbal and Non-Verbal Content: 4. Understands (complex and basic) - clear comprehension without cues or repetitions   Memory/Recall Ability Memory/Recall Ability : Current season;That he or she is in a hospital/hospital unit   Refer to Care Plan for [place] [facility] OT Short Term Goal 1 (Week 1): STG = LTG 2/2 ELOS  Recommendations for other services: None    Skilled Therapeutic Intervention Skilled OT intervention completed with discussion on POC, rehab goals and explanation of OT purpose. Pt received seated in w/c, agreeable to session. Report of 10/10 pain in residual limb, especially when pt was extending R knee, with report of tightness with therapist offering education about contracture prevention and pain management techniques outside of pain med intervention. Completed UB bathing and grooming tasks seated in w/c at sink. See caretool for further details on  assist level with self-care tasks performed. Completed slideboard transfer from w/c <> DABBSC with min A,  with cues needed for safety and positioning throughout. Overall pt demonstrates deficits in the following areas: endurance, coordination, pain, functional mobility, short term memory. Pt left seated in w/c, with belt alarm on, pt declining amputee support pads, with all needs in reach at end of session.   ADL ADL Eating: Not assessed Grooming: Setup Where Assessed-Grooming: Sitting at sink Upper Body Bathing: Setup Where Assessed-Upper Body Bathing: Sitting at sink Lower Body Bathing: Minimal assistance Where Assessed-Lower Body Bathing: Sitting at sink Upper Body Dressing: Setup Where Assessed-Upper Body Dressing: Sitting at sink Lower Body Dressing: Moderate assistance Where Assessed-Lower Body Dressing: Edge of bed Toileting: Not assessed Toilet Transfer: Minimal assistance Toilet Transfer Method: Theatre manager: Extra wide drop arm bedside commode Tub/Shower Transfer: Unable to assess Tub/Shower Transfer Method: Unable to assess Social research officer, government: Unable to assess Social research officer, government Method: Unable to assess Mobility  Bed Mobility Bed Mobility: Rolling Right;Supine to Sit Rolling Right: Moderate Assistance - Patient 50-74% Supine to Sit: Moderate Assistance - Patient 50-74%   Discharge Criteria: Patient will be discharged from OT if patient refuses treatment 3 consecutive times without medical reason, if treatment goals not met, if there is a change in medical status, if patient makes no progress towards goals or if patient is discharged from hospital.  The above assessment, treatment plan, treatment alternatives and goals were discussed and mutually agreed upon: by patient   Blase Mess, MS, OTR/L  01/07/2022, 7:32 AM

## 2022-01-07 NOTE — Progress Notes (Signed)
   HD treatment completed. Patient tolerated well. Fistula/Graft/HD without signs and symptoms of complications. Patient transported back to the room, alert and orient and in no acute distress. Report given to bedside RN.  Total UF removed:4.5  Medication given:Sodium Thiosulfate  Post HD VS:See flow sheet.  Post HD weight: 100.5kg

## 2022-01-07 NOTE — Discharge Summary (Signed)
Physician Discharge Summary  [name] MRN: [id number] DOB/AGE: 02-24-1971 51 y.o.  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 Essential hypertension Peripheral arterial disease Diabetes mellitus type 2 Benign prostatic hypertrophy Anemia of end-stage renal disease Hep C on Vitamin D deficiency  Discharged Condition: guarded  Significant Diagnostic Studies:  Labs:  Basic Metabolic Panel: Recent Labs  Lab 01/12/22 0628 01/14/22 1154 01/16/22 1427 Feb 10, 2022 0839 10-Feb-2022 2033 02/09/2022 0101 02/05/2022 0126 01/20/2022 0359 01/31/2022 0502 01/24/2022 0800  NA 131* 130* 133* 133* 133* 131* 133* 132* 117* 132*  K 5.2* 4.8 4.4 4.0 5.4* 5.9* 6.4* 6.5* 3.9 6.2*  CL 85* 87* 93* 92* 91*  --  92*  --  75*  --   CO2 26 23 26 28 27   --  21*  --  15*  --   GLUCOSE 83 118* 127* 84 120*  --  146*  --  873*  --   BUN 64* 53* 48* 29* 49*  --  53*  --  51*  --   CREATININE 8.35* 7.43* 6.98* 5.39* 6.27*  --  6.67*  --  6.26*  --   CALCIUM 10.0 9.4 9.0 9.1 8.6*  --  8.8*  --  8.2*  --   MG  --   --   --   --  1.6*  --   --   --  3.4*  --   PHOS 5.8* 4.4 3.9  --   --   --   --   --   --   --     CBC: Recent Labs  Lab February 10, 2022 0839 2022/02/10 1244 Feb 10, 2022 2032 02/14/2022 0101 01/23/2022 0126 01/23/2022 0359 02/11/2022 0502 02/07/2022 0800  WBC 9.5   < > 27.1*  --  27.3*  --  26.7*  --   NEUTROABS 7.3  --   --   --   --   --   --   --   HGB 11.1*   < > 8.9*   < > 8.3* 8.2* 6.6* 9.2*  HCT 36.3*   < > 27.5*   < > 26.9* 24.0* 22.2* 27.0*  MCV 107.1*   < > 106.6*  --  109.3*  --  118.1*  --   PLT 190   < > 234  --  257  --  176  --    < > = values in this interval not displayed.    CBG: Recent Labs  Lab 01/15/22 2051 01/16/22 0559  01/27/2022 0038 01/26/2022 0325 02/01/2022 0758  GLUCAP 107* 109* 154* 115* 186*    Brief HPI:   Michael Riggs is a 51 y.o. male who presented to the emergency department on 12/25/2021 with worsening breakdown of right Achilles decubitus ulcer. He had undergone debridement and tissue graft by Dr. Lajoyce Riggs and vascular intervention in March. Due to worsening gangrenous changes, he underwent right BKA by Dr. Lajoyce Riggs on 6/16. Nephrology consulted for HD orders history of ESRD, caliciphylaxis   Hospital Course: Michael Riggs was admitted to rehab 01/06/2022 for inpatient therapies to consist of PT, ST and OT at least three hours five days a week. Past admission physiatrist, therapy team and rehab RN have worked together to provide customized collaborative inpatient rehab.  Nephrology continued following  patient on daily basis.  Vitamin D deficiency noted and started ergocalciferol 50,000 units once a week for 7 weeks.  The patient was initially started on methadone to assist with phantom pain control however, he will be unable to receive this as an outpatient therefore it was weaned off.  Tolerating hemodialysis.  Cymbalta increased to 60 mg daily on 6/23 to assist with pain control.  Wound VAC removed 6/24 and local incisional care carried out.  Concern for worsening calciphylaxis lesion of left residual limb.  Discussed with Dr. Chales Riggs who explained no need for surgical consultation in absence of purulent drainage and the lesions typically take 3 to 6 months or more to heal and are being treated with lower calcium body load and sodium thiosulfate which he is receiving. Discussed with WOC RN as well without specific recommendations for this type of wound care. May benefit from outpatient wound care referral to address ongoing wound care.  Amitriptyline 10 mg nightly and gabapentin increased to 100 mg 3 times daily for phantom limb pain control. Completed HD on 6/30. On 7/1, developed hemoptysis versus  hematemesis. Medicine service and Gi service consulted and patient discharged to re-admit to acute care.  Blood pressures were monitored on TID basis and was in good control on metoprolol 25 mg twice daily and continuing to hold amlodipine.  Remained on midodrine before dialysis treatments due to chronic hypotension.  Diabetes has been monitored with ac/hs CBG checks and SSI was use prn for tighter BS control.  Levemir decreased from 5 to 4 units.  Meal coverage discontinued.  Hyperglycemia resolved off all medications and capillary blood glucose checks discontinued given excellent control with range of 72-109.   Rehab course: During patient's stay in rehab weekly team conferences were held to monitor patient's progress, set goals and discuss barriers to discharge. At admission, patient required mod assist with mobility, min to mod assist with ADLs.  He has had improvement in activity tolerance, balance, postural control as well as ability to compensate for deficits. He has had improvement in functional use RUE/LUE  and RLE/LLE as well as improvement in awareness   Disposition: Acute care Discharge disposition: 02-Transferred to [facility]        Diet: Renal  Special Instructions:  No driving, alcohol consumption or tobacco use.  Discharge Instructions     Ambulatory referral to Physical Medicine Rehab   Complete by: As directed    Hospital follow-up   Change dressing (specify)   Complete by: As directed    Dressing change: 1-2 times per day using ACE wrap.   Diet - low sodium heart healthy   Complete by: As directed    Increase activity slowly   Complete by: As directed       30-35 minutes were spent on discharge planning and discharge summary.  Allergies as of 09-Feb-2022       Reactions   Tape Itching   Prefers silk tape over regular tape        Medication List     STOP taking these medications    albuterol 108 (90 Base) MCG/ACT inhaler Commonly known  as: VENTOLIN HFA   insulin aspart 100 UNIT/ML injection Commonly known as: novoLOG   ipratropium-albuterol 0.5-2.5 (3) MG/3ML Soln Commonly known as: DUONEB   nutrition supplement (JUVEN) Pack   ondansetron 4 MG tablet Commonly known as: ZOFRAN   Oxycodone HCl 10 MG Tabs       TAKE these medications    acetaminophen 325  MG tablet Commonly known as: TYLENOL Take 1-2 tablets (325-650 mg total) by mouth every 4 (four) hours as needed for mild pain. What changed:  medication strength how much to take when to take this   amitriptyline 10 MG tablet Commonly known as: ELAVIL Take 1 tablet (10 mg total) by mouth at bedtime.   ascorbic acid 1000 MG tablet Commonly known as: VITAMIN C Take 1 tablet (1,000 mg total) by mouth daily. What changed:  medication strength how much to take when to take this   aspirin EC 81 MG tablet Take 1 tablet (81 mg total) by mouth daily. Swallow whole.   atorvastatin 40 MG tablet Commonly known as: LIPITOR Take 1 tablet (40 mg total) by mouth at bedtime.   bisacodyl 5 MG EC tablet Commonly known as: DULCOLAX Take 1 tablet (5 mg total) by mouth daily as needed for moderate constipation.   bromocriptine 2.5 MG tablet Commonly known as: PARLODEL Take 1 tablet (2.5 mg total) by mouth at bedtime.   buPROPion 300 MG 24 hr tablet Commonly known as: WELLBUTRIN XL Take 1 tablet (300 mg total) by mouth in the morning.   clopidogrel 75 MG tablet Commonly known as: PLAVIX Take 1 tablet (75 mg total) by mouth daily with breakfast.   docusate sodium 100 MG capsule Commonly known as: COLACE Take 1 capsule (100 mg total) by mouth daily.   DULoxetine 60 MG capsule Commonly known as: CYMBALTA Take 1 capsule (60 mg total) by mouth daily. What changed:  medication strength how much to take when to take this   ferric citrate 1 GM 210 MG(Fe) tablet Commonly known as: AURYXIA Take 3 tablets (630 mg total) by mouth 3 (three) times daily with  meals.   gabapentin 100 MG capsule Commonly known as: NEURONTIN Take 1 capsule (100 mg total) by mouth 3 (three) times daily.   HYDROmorphone 4 MG tablet Commonly known as: DILAUDID Take 1 tablet (4 mg total) by mouth every 4 (four) hours as needed for severe pain. What changed:  when to take this reasons to take this   Melatonin 10 MG Tabs Take 10 mg by mouth at bedtime.   metoprolol tartrate 25 MG tablet Commonly known as: LOPRESSOR Take 0.5 tablets (12.5 mg total) by mouth 2 (two) times daily. What changed: how much to take   midodrine 10 MG tablet Commonly known as: PROAMATINE Take 1 tablet (10 mg total) by mouth every Monday, Wednesday, and Friday.   multivitamin Tabs tablet Take 1 tablet by mouth at bedtime.   pantoprazole 40 MG tablet Commonly known as: PROTONIX Take 1 tablet (40 mg total) by mouth at bedtime.   polyethylene glycol 17 g packet Commonly known as: MIRALAX / GLYCOLAX Take 17 g by mouth daily as needed for mild constipation.   silver sulfADIAZINE 1 % cream Commonly known as: SILVADENE Apply 1 application. topically daily. Apply to affected area daily plus dry dressing   sodium chloride 0.9 % SOLN 100 mL with sodium thiosulfate 250 MG/ML SOLN 25 g Inject 25 g into the vein every Monday, Wednesday, and Friday with hemodialysis.   tamsulosin 0.4 MG Caps capsule Commonly known as: FLOMAX Take 1 capsule (0.4 mg total) by mouth at bedtime.   urea 10 % lotion Apply topically 2 (two) times daily.   zinc sulfate 220 (50 Zn) MG capsule Take 1 capsule (220 mg total) by mouth daily.               Discharge Care  Instructions  (From admission, onward)           Start     Ordered   February 12, 2022 0000  Change dressing (specify)       Comments: Dressing change: 1-2 times per day using ACE wrap.   2022/02/12 0945            Follow-up Information     Raulkar, Drema Pry, MD Follow up.   Specialty: Physical Medicine and Rehabilitation Why:  office will call you to arrange your appt (sent) Contact information: [address] [place] [address] [phone]         Nadara Mustard, MD Follow up.   Specialty: Orthopedic Surgery Why: Call to make arrangements for post-op follow-up appointment Contact information: [address] [place] [address] [phone]         Simone Curia, MD Follow up.   Specialty: Internal Medicine Why: Call to make arrangements or hospital  follow-up appointment Contact information: [address] [place] [address] [phone]         Maisie Fus, MD .   Specialty: Cardiology Contact information: [address] [place] [address] [phone]                 Signed: Milinda Antis 02/13/2022, 9:59 AM

## 2022-01-07 NOTE — Progress Notes (Addendum)
[facility] Progress Note   Subjective: Now on Rehab. Says he's been up since 0530. No C/Os. HD today on schedule.     Objective Vitals:   01/06/22 1723 01/06/22 2037 01/07/22 0500 01/07/22 0526  BP: 109/74 113/80  116/78  Pulse: 60 64  61  Resp: 16 18  17   Temp: 97.6 F (36.4 C) 98.2 F (36.8 C)  97.9 F (36.6 C)  TempSrc: Oral     SpO2: 100% 92%  100%  Weight: 108.9 kg  108.5 kg   Height: 5\' 4"  (1.626 m)      Physical Exam General: Chronically ill appearing male in NAD Heart: S1,S2 No M/R/G  Lungs: CTAB Abdomen:NABS, NT Extremities: R BKA with wound vac intact. L BKA with large necrotic appearing wound approx 3 inches below L knee, another smaller similar area to lateral side of his knee. Another lesion R index finger.  Dialysis Access: L AVF +T/B   Additional Objective Labs: Basic Metabolic Panel: Recent Labs  Lab 01/02/22 0700 01/05/22 0825  NA 129* 130*  K 4.5 5.0  CL 89* 89*  CO2 22 20*  GLUCOSE 136* 133*  BUN 71* 102*  CREATININE 7.39* 8.11*  CALCIUM 9.2 9.1  PHOS  --  6.3*   Liver Function Tests: Recent Labs  Lab 01/05/22 0825  ALBUMIN 2.6*   No results for input(s): "LIPASE", "AMYLASE" in the last 168 hours. CBC: Recent Labs  Lab 01/02/22 0700 01/03/22 0218 01/04/22 0141 01/05/22 0825  WBC 14.4* 16.3* 18.4* 13.0*  HGB 11.4* 11.0* 10.8* 11.3*  HCT 34.6* 34.8* 32.8* 33.9*  MCV 107.1* 106.7* 105.8* 105.0*  PLT 212 269 257 239   Blood Culture    Component Value Date/Time   SDES BLOOD RIGHT FOREARM 12/26/2021 0040   SPECREQUEST  12/26/2021 0040    BOTTLES DRAWN AEROBIC AND ANAEROBIC Blood Culture adequate volume   CULT MORGANELLA MORGANII (A) 12/26/2021 0040   REPTSTATUS 12/28/2021 FINAL 12/26/2021 0040    Cardiac Enzymes: No results for input(s): "CKTOTAL", "CKMB", "CKMBINDEX", "TROPONINI" in the last 168 hours. CBG: Recent Labs  Lab 01/06/22 1145 01/06/22 1558 01/06/22 2117 01/07/22 0613 01/07/22 1158  GLUCAP  125* 134* 135* 76 114*   Iron Studies: No results for input(s): "IRON", "TIBC", "TRANSFERRIN", "FERRITIN" in the last 72 hours. @lablastinr3 @ Studies/Results: No results found. Medications:  sodium thiosulfate 25 g in sodium chloride 0.9 % 200 mL Infusion for Calciphylaxis      vitamin C  1,000 mg Oral Daily   aspirin EC  81 mg Oral Daily   atorvastatin  40 mg Oral QHS   bromocriptine  2.5 mg Oral QHS   buPROPion  300 mg Oral q AM   clopidogrel  75 mg Oral Daily   DULoxetine  20 mg Oral BID   enoxaparin (LOVENOX) injection  30 mg Subcutaneous Q24H   ferric citrate  630 mg Oral TID WC   insulin aspart  0-6 Units Subcutaneous TID WC   insulin aspart  2 Units Subcutaneous TID WC   insulin detemir  5 Units Subcutaneous Daily   melatonin  10 mg Oral QHS   methadone  2.5 mg Oral Q12H   metoprolol tartrate  25 mg Oral BID   midodrine  10 mg Oral Q M,W,F   multivitamin  1 tablet Oral QHS   nutrition supplement (JUVEN)  1 packet Oral BID BM   pantoprazole  40 mg Oral Daily   tamsulosin  0.4 mg Oral QHS   urea  Topical BID   zinc sulfate  220 mg Oral Daily     Dialysis Orders: [facility] on MWF 180NRe 5 hours BFR 400 DFR 500 EDW 98.4kg 2K 2Ca AVF 15g needles - No heparin/ESA - Calcitriol 1 mcg PO q HD   Assessment/Plan: Chronic RLE wound/osteomyelitis: Failed limb salvage attempts, 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 Wound vac in place. Per ortho. ABX have been discontinued.  ESRD: Continue HD on usual MWF schedule - HD today on schedule. next HD on  01/09/2022 BP/volume: BP has been low, amlodipine d/c'd. Remains on metop BID with holding parameters. Using midodrine pre-HD. EDW will need to be adjusted s/p amputation on discharge. Despite amputation he is still very much above OP EDW.  UF as tolerated in HD and lower volume. Anemia of ESRD: Hgb 11.3 01/05/2022. Follow HGB.    Secondary Hyperparathyroidism: CorrCa high sided, calcitriol reduced 79mcg -> 41mcg q HD and now stopping Phos high,  changed to usual OP binder Lorin Picket).  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 Will resume Sodium Thiosulfate 25 gram with HD TIW. He agrees to plan. Needs lower calcium levels. Stop VDRA. Use 2.0 Ca Bath Nutrition: Alb low, continue supplements. T2DM- per primary.  Debility-Now on [place]. Brown NP-C 01/07/2022, 12:12 PM  [employer] [phone]   Seen and examined independently.  Agree with note and exam as documented above by physician extender and as noted here.  Seen and examined on dialysis.  Blood pressure 125/90 and HR 59.  AVF in use with good blood flow.  Tolerating goal.  Procedure supervised.   ESRD on HD  - HD per MWF schedule  Claudia Desanctis, MD 01/07/2022  2:52 PM

## 2022-01-07 NOTE — Evaluation (Signed)
Physical Therapy Assessment and Plan  Patient Details  Name: Michael Riggs MRN: [id number] Date of Birth: 1971/02/16  PT Diagnosis: Abnormal posture, Abnormality of gait, Contracture of joint: knee, Difficulty walking, Edema, Muscle weakness, and Pain in R residual limb Rehab Potential: Good ELOS: 7-10 days   Today's Date: 01/07/2022 PT Individual Time: 0730-0827 PT Individual Time Calculation (min): 24 min    Hospital Problem: Principal Problem:   Below-knee amputation of right lower extremity ([place]) Active Problems:   ESRD (end stage renal disease) ([place])   Status post below-knee amputation of left lower extremity ([place])   Past Medical History:  Past Medical History:  Diagnosis Date   Anemia    Anemia of chronic disease    Anxiety    ARF (acute renal failure) ([place]) 01/30/2019   Calciphylaxis    Controlled type 2 diabetes mellitus with hyperglycemia, without long-term current use of insulin ([place])    Debility 02/09/2019   Depression    Diabetes mellitus type 2 in obese ([place]) 07/04/2013   Diabetes mellitus with peripheral vascular disease ([place])    type 2 no meds, lost 100 lbs   Dyslipidemia    Dyspnea    inhaler   End stage renal disease ([place])    ESRD (end stage renal disease) on dialysis ([place]) 01/2019   MWFS   ESRD on dialysis [facility])    Essential hypertension    Fluid overload 02/04/2019   GERD (gastroesophageal reflux disease)    Goals of care, counseling/discussion    Habitual alcohol use 07/04/2013   Headache    Hyperkalemia 01/30/2019   Hypertension    Hypoglycemia 01/30/2019   Hypokalemia 01/30/2019   Labile blood pressure    Leukocytosis    Lower GI bleed 05/14/2019   Macrocytic anemia 01/30/2019   Obesity, Class II, BMI 35-39.9, with comorbidity 07/04/2013   Palliative care encounter    PDR (proliferative diabetic retinopathy) ([place]) 12/14/2013   Physical deconditioning    Pressure injury of skin 04/27/2019   Pruritus    Scrotal edema    Scrotal pain     Sleep apnea    uses CPAP   Sleep disturbance    Slow transit constipation    Status post below-knee amputation of left lower extremity ([place])    Stroke ([place])    mini -shown on CT scan   Syphilis 01/2019   Urinary retention    Venous hypertension 09/22/2013   Wound infection 03/28/2019   Past Surgical History:  Past Surgical History:  Procedure Laterality Date   ABDOMINAL AORTOGRAM W/LOWER EXTREMITY N/A 10/15/2021   Procedure: ABDOMINAL AORTOGRAM W/LOWER EXTREMITY;  Surgeon: Broadus John, MD;  Location: [facility];  Service: Cardiovascular;  Laterality: N/A;   AMPUTATION Right 01/02/2022   Procedure: RIGHT BELOW KNEE AMPUTATION;  Surgeon: [name], MD;  Location: [facility];  Service: Orthopedics;  Laterality: Right;   AV FISTULA PLACEMENT Left 01/09/2019   Procedure: ARTERIOVENOUS (AV) FISTULA CREATION LEFT UPPER ARM;  Surgeon: [name], MD;  Location: [facility];  Service: Vascular;  Laterality: Left;   [place] Left 07/06/2019   Procedure: BASILIC VEIN TRANSPOSITION SECOND STAGE LEFT ARM;  Surgeon: [name], MD;  Location: [facility];  Service: Vascular;  Laterality: Left;   below the knee amputation Left    EYE SURGERY Bilateral    FLEXIBLE SIGMOIDOSCOPY N/A 05/15/2019   Procedure: FLEXIBLE SIGMOIDOSCOPY;  Surgeon: Irving Copas., MD;  Location: [facility];  Service: Gastroenterology;  Laterality: N/A;  HEMOSTASIS CLIP PLACEMENT  05/15/2019   Procedure: HEMOSTASIS CLIP PLACEMENT;  Surgeon: Irving Copas., MD;  Location: [facility];  Service: Gastroenterology;;   HOT HEMOSTASIS N/A 05/15/2019   Procedure: HOT HEMOSTASIS (ARGON PLASMA COAGULATION/BICAP);  Surgeon: Irving Copas., MD;  Location: [facility];  Service: Gastroenterology;  Laterality: N/A;   I & D EXTREMITY Right 10/10/2021   Procedure: RIGHT ACHILLES DEBRIDEMENT AND TISSUE GRAFT;  Surgeon: [name], MD;  Location: [facility];  Service: Orthopedics;  Laterality:  Right;   IR FLUORO GUIDE CV LINE RIGHT  01/31/2019   IR FLUORO GUIDE CV LINE RIGHT  02/03/2019   IR THORACENTESIS ASP PLEURAL SPACE W/IMG GUIDE  11/08/2019   IR US GUIDE VASC ACCESS RIGHT  01/31/2019   IR US GUIDE VASC ACCESS RIGHT  02/03/2019   PERIPHERAL VASCULAR BALLOON ANGIOPLASTY  10/15/2021   Procedure: PERIPHERAL VASCULAR BALLOON ANGIOPLASTY;  Surgeon: Broadus John, MD;  Location: [facility];  Service: Cardiovascular;;  rt sfa and trifurcation   SCLEROTHERAPY  05/15/2019   Procedure: SCLEROTHERAPY;  Surgeon: Mansouraty, Telford Nab., MD;  Location: [facility];  Service: Gastroenterology;;    Assessment & Plan Clinical Impression: Patient is a 51 y.o. year old male with hx of L BKA, DM, ESRD on HD, orthostatic hypotension, PVD, Calciphylaxis, who presented to the emergency department on 12/25/2021 with worsening breakdown of right Achilles decubitus ulcer.  He has a history of peripheral vascular disease postintervention with drug-coated balloon angioplasty of the right lower extremity by Dr. Unk Lightning in March 2023.  He had undergone right Achilles debridement and placement of tissue graft by Dr. Sharol Given several days previously.  He has a history of end-stage renal disease on chronic intermittent hemodialysis and was receiving antibiotics during treatment.  He was admitted for concerns of osteomyelitis and placed on antibiotics and Dr. Sharol Given and nephrology consulted.  He was found to have progressive gangrenous changes involving the calcaneus and soft tissue envelope around the right heel.  Having failed limb salvage intervention, he recommended proceeding with transtibial amputation and he underwent right BKA on 6/16.  He tolerated the procedure well.  VAC was placed.  Hemodialysis continued on Mondays, Wednesdays and Fridays.  Antibiotics completed.  Attention noted to several skin lesions due to calciphylaxis.  Nephrology continues to manage as well as secondary hyperparathyroidism.The patient  requires inpatient medicine and rehabilitation evaluations and services for ongoing dysfunction secondary to right below the knee amputation.    Patient currently requires mod with mobility secondary to muscle weakness and muscle joint tightness, decreased cardiorespiratoy endurance, and decreased sitting balance, decreased standing balance, decreased postural control, decreased balance strategies, and difficulty maintaining precautions.  Prior to hospitalization, patient was modified independent  with mobility and lived with Family (brother, sister in law, niece and mother) in a [place] home.  Home access is  Level entry.  Patient will benefit from skilled PT intervention to maximize safe functional mobility, minimize fall risk, and decrease caregiver burden for planned discharge home with 24 hour supervision.  Anticipate patient will benefit from follow up [place] at discharge.  PT - End of Session Activity Tolerance: Tolerates 30+ min activity with multiple rests Endurance Deficit: Yes Endurance Deficit Description: required rest break after transfer PT Assessment Rehab Potential (ACUTE/IP ONLY): Good PT Barriers to Discharge: Decreased caregiver support;Wound Care;Weight;Hemodialysis;Weight bearing restrictions;Other (comments) PT Barriers to Discharge Comments: pain, brother is only one who can provide physical assist, body habitus, unable to tolerate wearing L prosthetic due to hypersensitivity.  PT Patient demonstrates impairments in the following area(s): Balance;Edema;Endurance;Motor;Nutrition;Pain;Skin Integrity PT Transfers Functional Problem(s): Bed Mobility;Bed to Chair;Car;Furniture PT Locomotion Functional Problem(s): Ambulation;Wheelchair Mobility;Stairs PT Plan PT Intensity: Minimum of 1-2 x/day ,45 to 90 minutes PT Frequency: 5 out of 7 days PT Duration Estimated Length of Stay: 7-10 days PT Treatment/Interventions: Ambulation/gait training;Discharge planning;Functional mobility  training;Psychosocial support;Therapeutic Activities;Visual/perceptual remediation/compensation;Balance/vestibular training;Disease management/prevention;Neuromuscular re-education;Skin care/wound management;Therapeutic Exercise;Wheelchair propulsion/positioning;Cognitive remediation/compensation;DME/adaptive equipment instruction;Pain management;Splinting/orthotics;UE/LE Strength taining/ROM;Community reintegration;Patient/family education;UE/LE Coordination activities PT Transfers Anticipated Outcome(s): supervision with LRAD PT Locomotion Anticipated Outcome(s): N/A PT Recommendation Follow Up Recommendations: Home health PT Patient destination: Home Equipment Recommended: To be determined Equipment Details: has WC, RW, and SPC   PT Evaluation Precautions/Restrictions Precautions Precautions: Fall;Other (comment) Precaution Comments: multiple areas of tissue breakdown to B legs, elbows. Wound vac Required Braces or Orthoses: Other Brace Splint/Cast: limb protector when OOB Restrictions Weight Bearing Restrictions: Yes RLE Weight Bearing: Non weight bearing LLE Weight Bearing: Weight bearing as tolerated Other Position/Activity Restrictions: Has a L prosthetic, wears for brief periods/transfers only. Pain Interference Pain Interference Pain Effect on Sleep: 2. Occasionally Pain Interference with Therapy Activities: 1. Rarely or not at all Pain Interference with Day-to-Day Activities: 4. Almost constantly Home Living/Prior Functioning Home Living Available Help at Discharge: Family;Available PRN/intermittently (brother works night shift, mother and sister in law unable to assist) Type of Home: Apartment Home Access: Level entry Home Layout: One level Bathroom Shower/Tub: Product/process development scientist: Standard Bathroom Accessibility: Yes Additional Comments: used TTB to bathe in tub/shower at baseline. has manual WC and RW. Pt reports being ambulatory with SPC and L  prosthetic. Would walk into [employer] and then use motorized scooter.  Lives With: Family (brother, sister in law, niece and mother) Prior Function Level of Independence: Requires assistive device for independence;Independent with basic ADLs;Independent with transfers;Independent with gait  Able to Take Stairs?: Yes Driving: No (brother drives him) Vocation: Unemployed Vision/Perception  Vision - History Ability to See in Adequate Light: 0 Adequate Vision - Assessment Additional Comments: pt reported initially having double vision but has since resolved day after surgery Perception Perception: Within Functional Limits Praxis Praxis: Intact  Cognition Overall Cognitive Status: Within Functional Limits for tasks assessed Arousal/Alertness: Awake/alert Orientation Level: Oriented X4 Memory: Appears intact Awareness: Appears intact Problem Solving: Appears intact Safety/Judgment: Appears intact Sensation Sensation Light Touch: Appears Intact Proprioception: Impaired by gross assessment Additional Comments: pt reports "muscle pain" in R residual limb and "ghost pain" in L residual limb Coordination Gross Motor Movements are Fluid and Coordinated: No Fine Motor Movements are Fluid and Coordinated: No Finger Nose Finger Test: dysmetria and slower RUE>LUE Motor  Motor Motor: Abnormal postural alignment and control Motor - Skilled Clinical Observations: 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, decreased balance/postural control, pain, and generalized weakness/deconditioning  Trunk/Postural Assessment  Cervical Assessment Cervical Assessment: Within Functional Limits Thoracic Assessment Thoracic Assessment: Exceptions to [facility] (throacic rounding) Lumbar Assessment Lumbar Assessment: Exceptions to [facility] (posterior pelvic tilt in sitting) Postural Control Postural Control: Deficits on evaluation Trunk Control: delayed  Balance Balance Balance Assessed: Yes Static Sitting  Balance Static Sitting - Balance Support: Bilateral upper extremity supported;Feet unsupported Static Sitting - Level of Assistance: 6: Modified independent (Device/Increase time) Static Sitting - Comment/# of Minutes: sitting EOB upon therapist's inital entry Dynamic Sitting Balance Dynamic Sitting - Balance Support: No upper extremity supported;Feet unsupported Dynamic Sitting - Level of Assistance: 5: Stand by assistance (CGA) Extremity Assessment  RLE Assessment RLE Assessment: Within Functional Limits General Strength Comments: tested in supine,  limited by pain RLE Strength Right Hip Flexion: 4-/5 Right Hip ABduction: 4-/5 Right Hip ADduction: 4-/5 Right Knee Flexion: 3+/5 Right Knee Extension: 4-/5 LLE Assessment LLE Assessment: Exceptions to [facility] General Strength Comments: tested in supine LLE Strength Left Hip Flexion: 4-/5 Left Hip ABduction: 4-/5 Left Hip ADduction: 4-/5 Left Knee Flexion: 3+/5 Left Knee Extension: 4-/5  Care Tool Care Tool Bed Mobility Roll left and right activity   Roll left and right assist level: Moderate Assistance - Patient 50 - 74%    Sit to lying activity        Lying to sitting on side of bed activity   Lying to sitting on side of bed assist level: the ability to move from lying on the back to sitting on the side of the bed with no back support.: Moderate Assistance - Patient 50 - 74%     Care Tool Transfers Sit to stand transfer Sit to stand activity did not occur: Safety/medical concerns (unable to use L prosthetic due to pain, weakness, RLE NWB)      Chair/bed transfer   Chair/bed transfer assist level: Moderate Assistance - Patient 50 - 74% (slideboard)     Toilet transfer   Assist Level: Minimal Assistance - Patient > 75% (slideboard)    Car transfer Car transfer activity did not occur: Safety/medical concerns (unable to use L prosthetic due to pain, weakness, RLE NWB)        Care Tool Locomotion Ambulation Ambulation  activity did not occur: Safety/medical concerns (unable to use L prosthetic due to pain, weakness, RLE NWB)        Walk 10 feet activity Walk 10 feet activity did not occur: Safety/medical concerns (unable to use L prosthetic due to pain, weakness, RLE NWB)       Walk 50 feet with 2 turns activity Walk 50 feet with 2 turns activity did not occur: Safety/medical concerns (unable to use L prosthetic due to pain, weakness, RLE NWB)      Walk 150 feet activity Walk 150 feet activity did not occur: Safety/medical concerns (unable to use L prosthetic due to pain, weakness, RLE NWB)      Walk 10 feet on uneven surfaces activity Walk 10 feet on uneven surfaces activity did not occur: Safety/medical concerns (unable to use L prosthetic due to pain, weakness, RLE NWB)      Stairs Stair activity did not occur: Safety/medical concerns (unable to use L prosthetic due to pain, weakness, RLE NWB)        Walk up/down 1 step activity Walk up/down 1 step or curb (drop down) activity did not occur: Safety/medical concerns (unable to use L prosthetic due to pain, weakness, RLE NWB)      Walk up/down 4 steps activity Walk up/down 4 steps activity did not occur: Safety/medical concerns (unable to use L prosthetic due to pain, weakness, RLE NWB)      Walk up/down 12 steps activity Walk up/down 12 steps activity did not occur: Safety/medical concerns (unable to use L prosthetic due to pain, weakness, RLE NWB)      Pick up small objects from floor Pick up small object from the floor (from standing position) activity did not occur: Safety/medical concerns (unable to use L prosthetic due to pain, weakness, RLE NWB)      Wheelchair Is the patient using a wheelchair?: Yes Type of Wheelchair: Manual Wheelchair activity did not occur: Safety/medical concerns (pain, weakness)      Wheel 50 feet with 2 turns  activity Wheelchair 50 feet with 2 turns activity did not occur: Safety/medical concerns (pain,  weakness)    Wheel 150 feet activity Wheelchair 150 feet activity did not occur: Safety/medical concerns (pain, weakness)      Refer to Care Plan for Long Term Goals  SHORT TERM GOAL WEEK 1 PT Short Term Goal 1 (Week 1): STG=LTG due to LOS  Recommendations for other services: None   Skilled Therapeutic Intervention Evaluation completed (see details above and below) with education on PT POC and goals and individual treatment initiated with focus on functional mobility/transfers, generalized strengthening and endurance, dynamic sitting balance/coordination, and amputee education. Received pt sitting EOB, pt educated on PT evaluation, CIR policies, and therapy schedule and agreeable. Pt reported pain 10/10 in R residual limb - RN notified and present to administer pain medication. Located 22x18 manual WC, R amputee support pad, and slideboard and upon returning pt in supine with RN attending to care. Pt transferred supine<>sitting EOB with HOB slightly elevated and use of bedrails with mod A. Pt reported L prosthetic was too painful to wear (may benefit from consult with [facility]). Pt unable to tolerate wearing limb guard on R residual limb and limb guard appeared too small (will notify [facility]). Pt transferred bed<>WC via slideboard to R with mod A (slightly downhill) and therapist stabilizing WC. Pt unable to tolerate amputee support pads but encouraged pt to practice knee extensions while waiting in between therapies.  Concluded session with pt sitting in WC, needs within reach, and seatbelt alarm on. Safety plan updated.   Mobility Bed Mobility Bed Mobility: Rolling Right;Supine to Sit Rolling Right: Moderate Assistance - Patient 50-74% Supine to Sit: Moderate Assistance - Patient 50-74% Transfers Transfers: Lateral/Scoot Transfers Lateral/Scoot Transfers: Moderate Assistance - Patient 50-74% Transfer (Assistive device): Other (Comment) (slideboard) Locomotion  Gait Ambulation:  No Gait Gait: No Stairs / Additional Locomotion Stairs: No Wheelchair Mobility Wheelchair Mobility: No   Discharge Criteria: Patient will be discharged from PT if patient refuses treatment 3 consecutive times without medical reason, if treatment goals not met, if there is a change in medical status, if patient makes no progress towards goals or if patient is discharged from hospital.  The above assessment, treatment plan, treatment alternatives and goals were discussed and mutually agreed upon: by patient  Alfonse Alpers PT, DPT  01/07/2022, 12:12 PM

## 2022-01-07 NOTE — Progress Notes (Signed)
Occupational Therapy Session Note  Patient Details  Name: Michael Riggs MRN: [id number] Date of Birth: 1970/08/23  Today's Date: 01/07/2022 OT Individual Time: 1100-1200 OT Individual Time Calculation (min): 60 min    Short Term Goals: Week 1:  OT Short Term Goal 1 (Week 1): STG = LTG 2/2 ELOS  Skilled Therapeutic Interventions/Progress Updates:  Pt seen for 2nd OT session with focus on positioning and tub transfer bench safety assessment and training. Pt reported discomfort using residual limb pad leg rests for w/c and had B residual limbs dangling upon OT arrival for session. Pt agreeable to all activity presented and OT transported pt with wound vac in bag on back of w/c and pt carrying transfer board to w/c room hallway for OT to obtain a L limb rest and w/c cushion then to tub demo bathroom. Transfer board training to and from tub transfer bench with min a and cues and support to ensure board and w/c secure. OT placed Comfort Co w/c cushion in w/c which allowed pt enough height with 2 support rolls to float distal residual limbs under knees on w/c limb pads. Pt tolerated and OT transported pt back to room as pt needed to have lunch prior to dialysis. Pain throughout session reported 5/10 R residual limb with increase only during mobility to 8/10. OT assisted pt w/c back to bed in prep for transport using slide board with min A. Bed exit alarm activated, nurse bell and needs in place within reach at end of session.   Therapy Documentation Precautions:  Precautions Precautions: Fall, Other (comment) Precaution Comments: multiple areas of tissue breakdown to B legs, elbows. Wound vac Required Braces or Orthoses: Other Brace Splint/Cast: limb protector when OOB Restrictions Weight Bearing Restrictions: Yes RLE Weight Bearing: Non weight bearing LLE Weight Bearing: Weight bearing as tolerated Other Position/Activity Restrictions: Has a L prosthetic, wears for brief periods/transfers  only.    Therapy/Group: Individual Therapy  Barnabas Lister 01/07/2022, 4:17 PM

## 2022-01-07 NOTE — Progress Notes (Signed)
PROGRESS NOTE   Subjective/Complaints: Has residual limb pain, feels the muscles are pulling Numbness in residual limb pain. Notes that Dilaudid 4mg  helps for 2 hours. He asks for dose to be increased  ROS: +residual limb pain  Objective:   No results found. Recent Labs    01/05/22 0825  WBC 13.0*  HGB 11.3*  HCT 33.9*  PLT 239   Recent Labs    01/05/22 0825  NA 130*  K 5.0  CL 89*  CO2 20*  GLUCOSE 133*  BUN 102*  CREATININE 8.11*  CALCIUM 9.1    Intake/Output Summary (Last 24 hours) at 01/07/2022 1059 Last data filed at 01/07/2022 0753 Gross per 24 hour  Intake 720 ml  Output 0 ml  Net 720 ml        Physical Exam: Vital Signs Blood pressure 116/78, pulse 61, temperature 97.9 F (36.6 C), resp. rate 17, height 5\' 4"  (1.626 m), weight 108.5 kg, SpO2 100 %. Gen: no distress, normal appearing HEENT: oral mucosa pink and moist, NCAT Cardio: Reg rate Chest: normal effort, normal rate of breathing Abdominal:     Comments: Slightly protuberant vs distended; soft, NT, (+) normoactive BS  Musculoskeletal:     Cervical back: Neck supple. No tenderness.     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     Comments: Decreased to light touch from knees down B/L  Psychiatric:     Comments: Flat, but interactive    Assessment/Plan: 1. Functional deficits which require 3+ hours per day of interdisciplinary therapy in a comprehensive inpatient rehab  setting. Physiatrist is providing close team supervision and 24 hour management of active medical problems listed below. Physiatrist and rehab team continue to assess barriers to discharge/monitor patient progress toward functional and medical goals  Care Tool:  Bathing    Body parts bathed by patient: Right arm, Left arm, Chest, Abdomen, Right upper leg, Left upper leg, Face   Body parts bathed by helper: Front perineal area, Buttocks Body parts n/a: Right lower leg, Left lower leg (bilateral BKA)   Bathing assist Assist Level: Minimal Assistance - Patient > 75%     Upper Body Dressing/Undressing Upper body dressing   What is the patient wearing?: Pull over shirt    Upper body assist Assist Level: Set up assist    Lower Body Dressing/Undressing Lower body dressing      What is the patient wearing?: Underwear/pull up, Pants     Lower body assist Assist for lower body dressing: Moderate Assistance - Patient 50 - 74%     Toileting Toileting Toileting Activity did not occur Landscape architect and  hygiene only): Refused  Toileting assist       Transfers Chair/bed transfer  Transfers assist  Chair/bed transfer activity did not occur: N/A  Chair/bed transfer assist level: Moderate Assistance - Patient 50 - 74% (slideboard)     Locomotion Ambulation   Ambulation assist   Ambulation activity did not occur: Safety/medical concerns (unable to use L prosthetic due to pain, weakness, RLE NWB)          Walk 10 feet activity   Assist  Walk 10 feet activity did not occur: Safety/medical concerns (unable to use L prosthetic due to pain, weakness, RLE NWB)        Walk 50 feet activity   Assist Walk 50 feet with 2 turns activity did not occur: Safety/medical concerns (unable to use L prosthetic due to pain, weakness, RLE NWB)         Walk 150 feet activity   Assist Walk 150 feet activity did not occur: Safety/medical concerns (unable to use L prosthetic  due to pain, weakness, RLE NWB)         Walk 10 feet on uneven surface  activity   Assist Walk 10 feet on uneven surfaces activity did not occur: Safety/medical concerns (unable to use L prosthetic due to pain, weakness, RLE NWB)         Wheelchair     Assist Is the patient using a wheelchair?: Yes Type of Wheelchair: Manual Wheelchair activity did not occur: Safety/medical concerns (pain, weakness)         Wheelchair 50 feet with 2 turns activity    Assist    Wheelchair 50 feet with 2 turns activity did not occur: Safety/medical concerns (pain, weakness)       Wheelchair 150 feet activity     Assist  Wheelchair 150 feet activity did not occur: Safety/medical concerns (pain, weakness)       Blood pressure 116/78, pulse 61, temperature 97.9 F (36.6 C), resp. rate 17, height 5\' 4"  (1.626 m), weight 108.5 kg, SpO2 100 %.  Medical Problem List and Plan: 1. 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             -patient may not shower until VAC removed             -ELOS/Goals: 7-10 days w/c level supervision to mod I  -Interdisciplinary Team Conference today   2.  Impaired mobility: continue Heparin             -antiplatelet therapy: Plavix and aspirin 3. Residual limb pain: increased Dilaudid to 4mg  q4H prn.              --will continue oxycodone 15 mg q 4 hours and consider methadone 2.5 mg in place of IV Dilaudid             -suggest methadone 2,5 mg BID since helpful for nerve pain esp in ESRD pt- and stop IV Dilaudid 4. Mood/Sleep: LCSW to evaluate and provide emotional support             -antipsychotic agents: n/a             -continue melatonin q HS             -continue Cymbalta DR 20 mg BID             -continue Wellbutrin XL 300 mg q AM 5. Neuropsych/cognition: This patient is capable of making decisions on his own  behalf. 6. Skin/Wound Care: Routine skin care checks              --Wound VAC to surgical incision; plan to remove Friday             --monitor calciphylaxis skin lesions 7. 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 Vitamin D deficiency: start ergocalciferol 50,000U once per week for 7 weeks.     LOS: 1 days A FACE TO FACE EVALUATION WAS PERFORMED  Michael Riggs 01/07/2022, 10:59 AM

## 2022-01-07 NOTE — Progress Notes (Signed)
Inpatient Rehabilitation  Patient information reviewed and entered into eRehab system by Peirce Deveney Shain Pauwels, OTR/L.   Information including medical coding, functional ability and quality indicators will be reviewed and updated through discharge.    

## 2022-01-08 LAB — HEPATITIS B SURFACE ANTIGEN: Hepatitis B Surface Ag: NONREACTIVE

## 2022-01-08 LAB — GLUCOSE, CAPILLARY
Glucose-Capillary: 91 mg/dL (ref 70–99)
Glucose-Capillary: 119 mg/dL — ABNORMAL HIGH (ref 70–99)
Glucose-Capillary: 47 mg/dL — ABNORMAL LOW (ref 70–99)
Glucose-Capillary: 89 mg/dL (ref 70–99)
Glucose-Capillary: 108 mg/dL — ABNORMAL HIGH (ref 70–99)

## 2022-01-08 LAB — HEPATITIS C ANTIBODY: HCV Ab: NONREACTIVE

## 2022-01-08 LAB — HEPATITIS B CORE ANTIBODY, TOTAL: Hep B Core Total Ab: NONREACTIVE

## 2022-01-08 LAB — HEPATITIS B SURFACE ANTIBODY,QUALITATIVE: Hep B S Ab: NONREACTIVE

## 2022-01-08 MED ORDER — GLUCOSE 40 % PO GEL
ORAL | Status: AC
  Administered 2022-01-08: 37.5 g
  Filled 2022-01-08: qty 1.21

## 2022-01-08 NOTE — Progress Notes (Signed)
Physical Therapy Session Note  Patient Details  Name: Michael Riggs MRN: [id number] Date of Birth: Jan 12, 1971  Today's Date: 01/08/2022 PT Individual Time: 3833-3832 PT Individual Time Calculation (min): 54 min   Short Term Goals: Week 1:  PT Short Term Goal 1 (Week 1): STG=LTG due to LOS  Skilled Therapeutic Interventions/Progress Updates:  Pt in bed on arrival. C/o's of 10/10 pain. RN in room and pain meds given. Agreed to PT at this time.   Limb guard donned to right LE prior to any mobility with no issues of fit noted or increased pain with use reported. (opened pads at bottom and to all the way out and placed it on bed under limb to lower limb down into it).  Had planned to attempt to don liner/prosthesis to left LE. Pt now has significant open wound on anterior tib covered with thick bandages due to moderate amount of drainage. Reports hitting it on bed rail. Did not don liner due to amount of pressure this would place on wound and potential to make them worse.   BED MOBILITY: Supine to EOB mod assist with HOB 30*, cues on technique with rail used. Increased time and effort needed.  TRANSFERS: Use of slide board for bed>wheelchair. Max assist to place board (pt able to weight shift over to allow for board placement). Min assist for sliding across board with guarding in front, pt able to use arms to slide over with occasional assist/support to residual limbs. Increased time needed for maximal pt performance of task.   STRENGTHENING:  Supine in bed: bil limbs quad sets with 5 second holds, straight leg raises and hip abd/add x 10 reps each.  Once in wheelchair had pt self propel wheelchair to nursing station and back for endurance training/UE strengthening with 3 rest breaks needed. Supervision assist.   Pt left in room in wheelchair with needs in reach and chair alarm on.     Therapy Documentation Precautions:  Precautions Precautions: Fall, Other (comment) Precaution  Comments: multiple areas of tissue breakdown to B legs, elbows. Wound vac Required Braces or Orthoses: Other Brace Splint/Cast: limb protector when OOB Restrictions Weight Bearing Restrictions: Yes RLE Weight Bearing: Non weight bearing LLE Weight Bearing: Weight bearing as tolerated Other Position/Activity Restrictions: Has a L prosthetic, wears for brief periods/transfers only.       Therapy/Group: Individual Therapy  Willow Ora, PTA, [place] 01/08/22, 4:59 PM

## 2022-01-08 NOTE — Patient Care Conference (Cosign Needed)
Inpatient RehabilitationTeam Conference and Plan of Care Update Date: 01/07/2022   Time: 11:49 AM    Patient Name: Michael Riggs      Medical Record Number: [id number]  Date of Birth: May 09, 1971 Sex: Male         Room/Bed: 4W06C/4W06C-01 Payor Info: Payor: MEDICARE / Plan: MEDICARE PART A AND B / Product Type: *No Product type* /    Admit Date/Time:  01/06/2022  5:19 PM  Primary Diagnosis:  Below-knee amputation of right lower extremity [facility])  Hospital Problems: Principal Problem:   Below-knee amputation of right lower extremity ([place]) Active Problems:   ESRD (end stage renal disease) ([place])   Status post below-knee amputation of left lower extremity [facility])    Expected Discharge Date: Expected Discharge Date: 02/09/2022  Team Members Present: Physician leading conference: Dr. Leeroy Cha Social Worker Present: Erlene Quan, BSW Nurse Present: Dorien Chihuahua, RN PT Present: Becky Sax, PT OT Present: Jennefer Bravo, OT PPS Coordinator present : Gunnar Fusi, SLP     Current Status/Progress Goal Weekly Team Focus  Bowel/Bladder     ESRD-HD; oliguria, continent        Swallow/Nutrition/ Hydration             ADL's   min A bathing, set up A UB dressing, Mod A LB dressing, Min A slideboard transfer to drop arm bariatric BSC  Mod I, Supervision  functional transfers, endurance, ADL retraining, home management education   Mobility   supine<>sit mod A, slideboard transfers mod A  supervision  functional mobility/transfers, generalized strengthening and endurance, amputee education, dynamic standing balance, D/C planning, caregiver education   Communication             Safety/Cognition/ Behavioral Observations            Pain     Left leg pain with prosthetic   Pain managed   MD changed pain medications, assess need for and effectiveness of meds  Skin     Wound vac x 7 days   Manage dressing to incision       Discharge Planning:      Team  Discussion: Patient having pain in leg with vac and painful limb with prosthetic; MD adjusting pain meds.  ESRD- HD; oliguria. Anticipate wound vac therapy for 7 days; then dressing changes to incision.   Patient on target to meet rehab goals: yes, currently needs set up for upper body dressing and mod assist for lower body dressing. Requires mod assist for slide-board transfers. Min assistance for slide-board transfer to [facility]. Goals for discharge set for supervision overall. Anticipate will be at a w/c mobility level at discharge.  *See Care Plan and progress notes for long and short-term goals.   Revisions to Treatment Plan:  N/a  Teaching Needs: Safety, transfers, medications, skin care, etc  Current Barriers to Discharge: Decreased caregiver support and Hemodialysis  Possible Resolutions to Barriers: Family education     Medical Summary Current Status: residual limb pain, chronic pain, morbid obesity with severe complication, end-stage-renal disease  Barriers to Discharge: Medical stability;Hemodialysis  Barriers to Discharge Comments: residual limb pain, chronic pain, morbid obesity with severe complication, end-stage-renal disease Possible Resolutions to [employer] Focus: increase 4mg  Dilaudid frequency to q4H prn, wean off methadone, provide dietary education, continue hemdoalysis   Continued Need for Acute Rehabilitation Level of Care: The patient requires daily medical management by a physician with specialized training in physical medicine and rehabilitation for the following reasons: Direction of a multidisciplinary physical rehabilitation  program to maximize functional independence : Yes Medical management of patient stability for increased activity during participation in an intensive rehabilitation regime.: Yes Analysis of laboratory values and/or radiology reports with any subsequent need for medication adjustment and/or medical intervention. : Yes   I attest that I  was present, lead the team conference, and concur with the assessment and plan of the team.   Dorien Chihuahua B 01/08/2022, 7:30 AM

## 2022-01-08 NOTE — Progress Notes (Signed)
Occupational Therapy Session Note  Patient Details  Name: Michael Riggs MRN: [id number] Date of Birth: [birth year]  Today's Date: 01/08/2022 OT Individual Time: 1103-1200 OT Individual Time Calculation (min): 57 min    Short Term Goals: Week 1:  OT Short Term Goal 1 (Week 1): STG = LTG 2/2 ELOS  Skilled Therapeutic Interventions/Progress Updates:    Pt greeted seated in wc. Pt stated his pain was a 10/10, but is now a 9/10. Pt dosing off frequently and needed cues to maintain alertness. With encouragement, pt agreeable to try to participate in therapy. UB there-ex using 3 lb dowel rod, 3 sets of 10, bicep curl, chest press, straight arm raise, and triceps press. LB there-ex 3 sets of 10 knee extension and hip abduction.  Pt already with some contracture of L residual limb. Pt stated he has not been able to don his prosthesis on his LLE in 6 months due to skin issues and wounds. OT educated further on desensitization techniques. OT donned R residual limb guard, then pt transferred back to bed using slideboard and min A with OT positioning of slideboard. Pt left semi-reclined in bed with bed alarm on, call bell in reach, and needs met.  Therapy Documentation Precautions:  Precautions Precautions: Fall, Other (comment) Precaution Comments: multiple areas of tissue breakdown to B legs, elbows. Wound vac Required Braces or Orthoses: Other Brace Splint/Cast: limb protector when OOB Restrictions Weight Bearing Restrictions: Yes RLE Weight Bearing: Non weight bearing LLE Weight Bearing: Weight bearing as tolerated Other Position/Activity Restrictions: Has a L prosthetic, wears for brief periods/transfers only.  Pain: Pain Assessment Pain Scale: 0-10 Pain Score: 9 Pain Type: Surgical pain;Acute pain Pain Location: Leg Pain Orientation: Right Pain Descriptors / Indicators: Aching;Stabbing Pain Frequency: Constant Pain Onset: On-going Patients Stated Pain Goal: 4 Pain  Intervention(s): Repositioned    Therapy/Group: Individual Therapy  Valma Cava 01/08/2022, 11:27 AM

## 2022-01-08 NOTE — Progress Notes (Signed)
Occupational Therapy Session Note  Patient Details  Name: Michael Riggs MRN: [id number] Date of Birth: 07-Jan-1971  Today's Date: 01/08/2022 OT Individual Time: 9937-1696 OT Individual Time Calculation (min): 71 min    Short Term Goals: Week 1:  OT Short Term Goal 1 (Week 1): STG = LTG 2/2 ELOS  Skilled Therapeutic Interventions/Progress Updates:  Skilled OT intervention completed with focus on activity tolerance, limb education and BUE endurance. Pt received supine in bed, asleep, requiring multimodal cues to waken. Pt maintained a very lethargic and confused state during first part of session with some improvement with exercise, however still falling asleep mid-sentence and movement despite arousal attempts. Pt required re-direction and increased time for transitions 2/2 poor concentration. Therapist did consult nursing about the adjustments made by MD with pt's meds. Discussed with team that the med adjustments might not be appropriate for him 2/2 inability to maintain arousal during therapy sessions.  Pt initially declining OOB tasks, however agreeable to sit up to eat a few bites of lunch. Completed bed mobility with min A for trunk elevation using HHA. Pt ate a few bites but often zoned out while eating therefore reported wanting to wait til later time. Completed slideboard transfer > R from EOB > w/c with min A. Therapist encouraged donning limb guard, however pt declining. Therapist applied bilateral amputee support pads for passive knee extension stretching for contracture prevention throughout session.  Pt completed oral care at sink, with set up A however needed re-directive cues as pt would get distracted/zone out with watching tv vs task. Transported dependently in w/c for time <> gym. Seated at [facility] pt participated in the following intervals to promote BUE endurance needed for LB self-care tasks: 3 mins forwards, level 5 (x2) 3 mins, backwards, level 5 (x2) Pt with lack of  effort, however with improved participation when music was utilized for increased effort.  Returned to room, with pt slideboard transfer > L with Min A, with up to max A for trunk support as pt immediately laid trunk down once on mattress, expressing extreme fatigue. Therapist provided education on safety implications with this technique regardless of fatigue. Pt immediately went to sleep upon being supine. Nursing made aware of L residual limb draining where PT changed bandage, with concern that prosthetic will not be appropriate until skin integrity improves. Pt was left semi-supine, with bed alarm on and all needs in reach at end of session.   Therapy Documentation Precautions:  Precautions Precautions: Fall, Other (comment) Precaution Comments: multiple areas of tissue breakdown to B legs, elbows. Wound vac Required Braces or Orthoses: Other Brace Splint/Cast: limb protector when OOB Restrictions Weight Bearing Restrictions: Yes RLE Weight Bearing: Non weight bearing LLE Weight Bearing: Weight bearing as tolerated Other Position/Activity Restrictions: Has a L prosthetic, wears for brief periods/transfers only.    Therapy/Group: Individual Therapy  Blase Mess, MS, OTR/L  01/08/2022, 7:51 AM

## 2022-01-08 NOTE — Progress Notes (Signed)
Hypoglycemic Event  47  Treatment: 1 tube glucose gel  Symptoms: None  Follow-up CBG: Time 173 CBG Result:89  Possible Reasons for Event: Inadequate meal intake  Comments/MD notified:yes    Milus Mallick

## 2022-01-08 NOTE — Progress Notes (Signed)
Inpatient Rehabilitation Care Coordinator Assessment and Plan Patient Details  Name: Michael Riggs MRN: [id number] Date of Birth: [birth year]  Today's Date: 01/08/2022  Hospital Problems: Principal Problem:   Below-knee amputation of right lower extremity ([place]) Active Problems:   ESRD (end stage renal disease) ([place])   Status post below-knee amputation of left lower extremity ([place])  Past Medical History:  Past Medical History:  Diagnosis Date   Anemia    Anemia of chronic disease    Anxiety    ARF (acute renal failure) ([place]) 01/30/2019   Calciphylaxis    Controlled type 2 diabetes mellitus with hyperglycemia, without long-term current use of insulin ([place])    Debility 02/09/2019   Depression    Diabetes mellitus type 2 in obese ([place]) 07/04/2013   Diabetes mellitus with peripheral vascular disease ([place])    type 2 no meds, lost 100 lbs   Dyslipidemia    Dyspnea    inhaler   End stage renal disease ([place])    ESRD (end stage renal disease) on dialysis ([place]) 01/2019   MWFS   ESRD on dialysis [facility])    Essential hypertension    Fluid overload 02/04/2019   GERD (gastroesophageal reflux disease)    Goals of care, counseling/discussion    Habitual alcohol use 07/04/2013   Headache    Hyperkalemia 01/30/2019   Hypertension    Hypoglycemia 01/30/2019   Hypokalemia 01/30/2019   Labile blood pressure    Leukocytosis    Lower GI bleed 05/14/2019   Macrocytic anemia 01/30/2019   Obesity, Class II, BMI 35-39.9, with comorbidity 07/04/2013   Palliative care encounter    PDR (proliferative diabetic retinopathy) ([place]) 12/14/2013   Physical deconditioning    Pressure injury of skin 04/27/2019   Pruritus    Scrotal edema    Scrotal pain    Sleep apnea    uses CPAP   Sleep disturbance    Slow transit constipation    Status post below-knee amputation of left lower extremity ([place])    Stroke ([place])    mini -shown on CT scan   Syphilis 01/2019   Urinary retention    Venous  hypertension 09/22/2013   Wound infection 03/28/2019   Past Surgical History:  Past Surgical History:  Procedure Laterality Date   ABDOMINAL AORTOGRAM W/LOWER EXTREMITY N/A 10/15/2021   Procedure: ABDOMINAL AORTOGRAM W/LOWER EXTREMITY;  Surgeon: Broadus John, MD;  Location: [place] CV LAB;  Service: Cardiovascular;  Laterality: N/A;   AMPUTATION Right 01/02/2022   Procedure: RIGHT BELOW KNEE AMPUTATION;  Surgeon: [name], MD;  Location: [place];  Service: Orthopedics;  Laterality: Right;   AV FISTULA PLACEMENT Left 01/09/2019   Procedure: ARTERIOVENOUS (AV) FISTULA CREATION LEFT UPPER ARM;  Surgeon: [name], MD;  Location: [facility];  Service: Vascular;  Laterality: Left;   [place] Left 07/06/2019   Procedure: BASILIC VEIN TRANSPOSITION SECOND STAGE LEFT ARM;  Surgeon: [name], MD;  Location: [place];  Service: Vascular;  Laterality: Left;   below the knee amputation Left    EYE SURGERY Bilateral    FLEXIBLE SIGMOIDOSCOPY N/A 05/15/2019   Procedure: FLEXIBLE SIGMOIDOSCOPY;  Surgeon: Irving Copas., MD;  Location: [place];  Service: Gastroenterology;  Laterality: N/A;   HEMOSTASIS CLIP PLACEMENT  05/15/2019   Procedure: HEMOSTASIS CLIP PLACEMENT;  Surgeon: Irving Copas., MD;  Location: [place];  Service: Gastroenterology;;   HOT HEMOSTASIS N/A 05/15/2019   Procedure: HOT HEMOSTASIS (ARGON PLASMA COAGULATION/BICAP);  Surgeon: Rush Landmark,  Telford Nab., MD;  Location: [facility] ENDOSCOPY;  Service: Gastroenterology;  Laterality: N/A;   I & D EXTREMITY Right 10/10/2021   Procedure: RIGHT ACHILLES DEBRIDEMENT AND TISSUE GRAFT;  Surgeon: [name], MD;  Location: [place];  Service: Orthopedics;  Laterality: Right;   IR FLUORO GUIDE CV LINE RIGHT  01/31/2019   IR FLUORO GUIDE CV LINE RIGHT  02/03/2019   IR THORACENTESIS ASP PLEURAL SPACE W/IMG GUIDE  11/08/2019   IR US GUIDE VASC ACCESS RIGHT  01/31/2019   IR US GUIDE VASC ACCESS RIGHT  02/03/2019    PERIPHERAL VASCULAR BALLOON ANGIOPLASTY  10/15/2021   Procedure: PERIPHERAL VASCULAR BALLOON ANGIOPLASTY;  Surgeon: Broadus John, MD;  Location: [place] [facility];  Service: Cardiovascular;;  rt sfa and trifurcation   SCLEROTHERAPY  05/15/2019   Procedure: SCLEROTHERAPY;  Surgeon: Mansouraty, Telford Nab., MD;  Location: [place];  Service: Gastroenterology;;   Social History:  reports that he quit smoking about 2 years ago. His smoking use included cigarettes. He started smoking about 3 years ago. He has never been exposed to tobacco smoke. He has never used smokeless tobacco. He reports that he does not currently use alcohol. He reports that he does not currently use drugs.  Family / Support Systems Other Supports: Don (Sister), Moises (Brother), Verdis Frederickson (Mother) Ability/Limitations of Caregiver: Brother avaliable PRN to provide physical assistance. Mother and SIL avaliable 24/7, no physical assistance Family Dynamics: support from parents and siblings  Social History Preferred language: English Religion: [place] - How often do you need to have someone help you when you read instructions, pamphlets, or other written material from your doctor or pharmacy?: Never Writes: Yes   Abuse/Neglect Abuse/Neglect Assessment Can Be Completed: Yes Physical Abuse: Denies Verbal Abuse: Denies Sexual Abuse: Denies Exploitation of patient/patient's resources: Denies Self-Neglect: Denies  Patient response to: Social Isolation - How often do you feel lonely or isolated from those around you?: Never  Emotional Status Recent Psychosocial Issues: coping Psychiatric History: hx of anxiety, depression Substance Abuse History: n/a  Patient / Family Perceptions, Expectations & Goals Pt/Family understanding of illness & functional limitations: yes Premorbid pt/family roles/activities: Patient previously independent Anticipated changes in roles/activities/participation: Brother  avaliable on PRN basis to provide physical assistance. Mother and SIL avaliable 24/7 Pt/family expectations/goals: MOD I to Supervision  [employer]: None Premorbid Home Care/DME Agencies: Other (Comment) ([facility] [employer], [employer], [facility], [facility]) Transportation available at discharge: Family able to transport, brother primarily Is the patient able to respond to transportation needs?: Yes In the past 12 months, has lack of transportation kept you from medical appointments or from getting medications?: No In the past 12 months, has lack of transportation kept you from meetings, work, or from getting things needed for daily living?: No Resource referrals recommended: Neuropsychology  Discharge Planning Living Arrangements: Parent, Other relatives (Mother, brother and sister in Sports coach) Support Systems: Parent, Other relatives Type of Residence: Private residence Insurance Resources: Chartered certified accountant Resources: Family Support Financial Screen Referred: No Living Expenses: Lives with family Money Management: Patient, Family Does the patient have any problems obtaining your medications?: No Home Management: Independent Patient/Family Preliminary Plans: Plans to continue Care Coordinator Barriers to Discharge: Hemodialysis, Decreased caregiver support Care Coordinator Anticipated Follow Up Needs: HH/OP DC Planning Additional Notes/Comments: HD (MWF-[place]), Wound Vac Expected length of stay: 7-10 Days  Clinical Impression Sw met with patient, introduced self and explained role. Patient planning to return home with assistance from his mother, SIL and  brother. Patient concerned due to his lost cell phone, sw provided patient with patient experience contact information to report. No additional questions or concerns, sw will continue to follow up.  Dyanne Iha 01/08/2022, 12:30 PM

## 2022-01-08 NOTE — Progress Notes (Signed)
[place] [facility] Individual Statement of Services  Patient Name:  Michael Riggs  Date:  01/08/2022  Welcome to the [facility].  Our goal is to provide you with an individualized program based on your diagnosis and situation, designed to meet your specific needs.  With this comprehensive rehabilitation program, you will be expected to participate in at least 3 hours of rehabilitation therapies Monday-Friday, with modified therapy programming on the weekends.  Your rehabilitation program will include the following services:  Physical Therapy (PT), Occupational Therapy (OT), Speech Therapy (ST), 24 hour per day rehabilitation nursing, Therapeutic Recreaction (TR), Neuropsychology, Care Coordinator, Rehabilitation Medicine, Nutrition Services, Pharmacy Services, and Other  Weekly team conferences will be held on Wednesdays to discuss your progress.  Your Inpatient Rehabilitation Care Coordinator will talk with you frequently to get your input and to update you on team discussions.  Team conferences with you and your family in attendance may also be held.  Expected length of stay: 7-10 Days  Overall anticipated outcome: MOD I to Supervision  Depending on your progress and recovery, your program may change. Your Inpatient Rehabilitation Care Coordinator will coordinate services and will keep you informed of any changes. Your Inpatient Rehabilitation Care Coordinator's name and contact numbers are listed  below.  The following services may also be recommended but are not provided by the [facility]:   [place] will be made to provide these services after discharge if needed.  Arrangements include referral to agencies that provide these services.  Your insurance has been verified to be:   Medicare A & B Your primary doctor is:  [name], MD  Pertinent  information will be shared with your doctor and your insurance company.  Inpatient Rehabilitation Care Coordinator:  Erlene Quan, [place] or [phone]  Information discussed with and copy given to patient by: Dyanne Iha, 01/08/2022, 10:36 AM

## 2022-01-08 NOTE — Progress Notes (Addendum)
[facility] Progress Note   Subjective: No issues reported with HD 01/08/2022. Finally getting close to OP EDW. Next HD 01/09/2022  Objective Vitals:   01/08/22 0500 01/08/22 0550 01/08/22 0550 01/08/22 0951  BP:  112/76 112/76 116/74  Pulse:  64 64 68  Resp:  18 18   Temp:  97.9 F (36.6 C) 97.9 F (36.6 C)   TempSrc:      SpO2:  98% 91%   Weight: 100.6 kg     Height:       Physical Exam General: Chronically ill appearing male in NAD Heart: S1,S2 No M/R/G  Lungs: CTAB Abdomen:NABS, NT Extremities: R BKA with wound vac intact. L BKA with large necrotic appearing wound approx 3 inches below L knee, another smaller similar area to lateral side of his knee. Another lesion R index finger.  Dialysis Access: L AVF +T/B  Additional Objective Labs: Basic Metabolic Panel: Recent Labs  Lab 01/02/22 0700 01/05/22 0825 01/07/22 1330  NA 129* 130* 135  K 4.5 5.0 5.3*  CL 89* 89* 89*  CO2 22 20* 22  GLUCOSE 136* 133* 126*  BUN 71* 102* 76*  CREATININE 7.39* 8.11* 7.86*  CALCIUM 9.2 9.1 9.7  PHOS  --  6.3* 4.7*   Liver Function Tests: Recent Labs  Lab 01/05/22 0825 01/07/22 1330  ALBUMIN 2.6* 2.7*   No results for input(s): "LIPASE", "AMYLASE" in the last 168 hours. CBC: Recent Labs  Lab 01/02/22 0700 01/03/22 0218 01/04/22 0141 01/05/22 0825 01/07/22 1200  WBC 14.4* 16.3* 18.4* 13.0* 13.4*  HGB 11.4* 11.0* 10.8* 11.3* 11.1*  HCT 34.6* 34.8* 32.8* 33.9* 33.9*  MCV 107.1* 106.7* 105.8* 105.0* 104.0*  PLT 212 269 257 239 215   Blood Culture    Component Value Date/Time   SDES BLOOD RIGHT FOREARM 12/26/2021 0040   SPECREQUEST  12/26/2021 0040    BOTTLES DRAWN AEROBIC AND ANAEROBIC Blood Culture adequate volume   CULT MORGANELLA MORGANII (A) 12/26/2021 0040   REPTSTATUS 12/28/2021 FINAL 12/26/2021 0040    Cardiac Enzymes: No results for input(s): "CKTOTAL", "CKMB", "CKMBINDEX", "TROPONINI" in the last 168 hours. CBG: Recent Labs  Lab  01/06/22 2117 01/07/22 0613 01/07/22 1158 01/07/22 1902 01/08/22 0546  GLUCAP 135* 76 114* 108* 91   Iron Studies: No results for input(s): "IRON", "TIBC", "TRANSFERRIN", "FERRITIN" in the last 72 hours. @lablastinr3 @ Studies/Results: No results found. Medications:  sodium thiosulfate 25 g in sodium chloride 0.9 % 200 mL Infusion for Calciphylaxis 25 g (01/07/22 1732)    vitamin C  1,000 mg Oral Daily   aspirin EC  81 mg Oral Daily   atorvastatin  40 mg Oral QHS   bromocriptine  2.5 mg Oral QHS   buPROPion  300 mg Oral q AM   Chlorhexidine Gluconate Cloth  6 each Topical Q0600   clopidogrel  75 mg Oral Daily   DULoxetine  20 mg Oral BID   enoxaparin (LOVENOX) injection  30 mg Subcutaneous Q24H   ferric citrate  630 mg Oral TID WC   insulin aspart  0-6 Units Subcutaneous TID WC   insulin aspart  2 Units Subcutaneous TID WC   insulin detemir  4 Units Subcutaneous Daily   melatonin  10 mg Oral QHS   methadone  2.5 mg Oral Q12H   metoprolol tartrate  25 mg Oral BID   midodrine  10 mg Oral Q M,W,F   multivitamin  1 tablet Oral QHS   nutrition supplement (JUVEN)  1 packet Oral  BID BM   pantoprazole  40 mg Oral Daily   tamsulosin  0.4 mg Oral QHS   urea   Topical BID   Vitamin D (Ergocalciferol)  50,000 Units Oral Q7 days   zinc sulfate  220 mg Oral Daily     Dialysis Orders: [place] [facility] on MWF 180NRe 5 hours BFR 400 DFR 500 EDW 98.4kg 2K 2Ca AVF 15g needles - No heparin/ESA - Calcitriol 1 mcg PO q HD   Assessment/Plan: Chronic RLE wound/osteomyelitis: Failed limb salvage attempts, 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 Wound vac in place. Per ortho. ABX have been discontinued.  ESRD: Continue HD on usual MWF schedule - HD today on schedule. next HD on  01/09/2022 BP/volume: BP has been low, amlodipine d/c'd. Remains on metop BID with holding parameters. Using midodrine pre-HD. EDW will need to be adjusted s/p amputation on discharge. Despite amputation he is still above OP EDW. H/O high  IDWG. Pulling high goals now which is tolerating.  UF as tolerated in HD and lower volume. Anemia of ESRD: Hgb 11.3 01/05/2022. Follow HGB.    Secondary Hyperparathyroidism: CorrCa high sided, calcitriol discontinued.  Phos high, changed to usual OP binder Gean Quint).  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 Will resume Sodium Thiosulfate 25 gram with HD TIW. He agrees to plan. Needs lower calcium levels. Stop VDRA. Use 2.0 Ca Bath. Avoid calcium based binders, VDRA.  Nutrition: Alb low, continue supplements. T2DM- per primary.  Debility-Now on CIRC    Rita H. Brown NP-C 01/08/2022, 10:03 AM  [employer] [phone]   Seen and examined independently.  Agree with note and exam as documented above by physician extender and as noted here.  HD yesterday without UF charted.   General adult male in bed in no acute distress HEENT normocephalic atraumatic extraocular movements intact sclera anicteric Neck supple trachea midline Lungs clear to auscultation bilaterally normal work of breathing at rest  Heart S1S2 no rub Abdomen soft nontender nondistended Extremities bilateral BKA   Psych normal mood and affect Access LUE AVF bruit and thrill    ESRD - HD per MWF schedule. Watching K and has a renal diet now which should help   Chronic right LE wound and osteo - s/p right BKA on 6/16   Hx calciphylaxis. Now with concern for recurrence - on sodium thiosulfate and stopped calcitriol    Anemia CKD Hb 11.1 - meets goal  Estanislado Emms, MD 01/08/2022  12:35 PM

## 2022-01-09 LAB — GLUCOSE, CAPILLARY
Glucose-Capillary: 75 mg/dL (ref 70–99)
Glucose-Capillary: 140 mg/dL — ABNORMAL HIGH (ref 70–99)
Glucose-Capillary: 120 mg/dL — ABNORMAL HIGH (ref 70–99)

## 2022-01-09 LAB — HEPATITIS B SURFACE ANTIBODY, QUANTITATIVE: Hep B S AB Quant (Post): 5.9 m[IU]/mL — ABNORMAL LOW (ref >9.9)

## 2022-01-09 MED ORDER — DULOXETINE HCL 60 MG PO CPEP
60.0000 mg | ORAL_CAPSULE | Freq: Every day | ORAL | Status: DC
  Administered 2022-01-09 – 2022-01-16 (×8): 60 mg via ORAL
  Filled 2022-01-09 (×9): qty 1

## 2022-01-09 NOTE — Progress Notes (Signed)
Physical Therapy Session Note  Patient Details  Name: Michael Riggs MRN: [id number] Date of Birth: 05/07/1971  Today's Date: 01/09/2022 PT Individual Time: 1100-1145 PT Individual Time Calculation (min): 45 min  and Today's Date: 01/09/2022 PT Missed Time: 15 Minutes Missed Time Reason: Other (Comment) (fatigue)  Short Term Goals: Week 1:  PT Short Term Goal 1 (Week 1): STG=LTG due to LOS  Skilled Therapeutic Interventions/Progress Updates:      Pt sitting in w/c to start. But hesitant but agreeable to therapy session. He reports high fatigue levels from busy morning of therapies (today is dialysis day, so all therapy in AM with this being his last session). Pt agreeable to light activity only and offered to complete there-ex outside. Pt transported from his room outside near [facility] with totalA for time management and energy conservation. While outdoors, he completed the following seated there-x in w/c: -1x8 w/c pushups -1x15 rows with yellow TB -1x15 front raises with yellow TB -1x15 SA punches with yellow TB  Practiced w/c mobility outdoors on unlevel paved surfaces which he completed with supervision - able to propel only ~83ft distances due to fatigue.   Pt with questions re: PWC options for DC. He requests a PWC primarily to assist with bed<>chair transfers as his queen sized bed is high off the ground and he will have trouble returning to the bed via SB transfers. He requests a PWC that will elevate to accommodate for height difference . Will relay this to primary PT for possible custom w/c evaluation.   Pt returned upstairs and requested to lie down in bed due to fatigue. SB transfer with minA from w/c to EOB with totalA for SB placement. Pt with posterior bias during SB transfer and required minA to faciliate forward weight shift. Required minA for safely transitioning to supine position. Able to reposition himself with cues and BUE on bed rails to assist. He concluded session  in bed with all needs met. He missed 15 minutes of therapy due to fatigue.   Therapy Documentation Precautions:  Precautions Precautions: Fall, Other (comment) Precaution Comments: multiple areas of tissue breakdown to B legs, elbows. Wound vac Required Braces or Orthoses: Other Brace Splint/Cast: limb protector when OOB Restrictions Weight Bearing Restrictions: Yes RLE Weight Bearing: Non weight bearing LLE Weight Bearing: Weight bearing as tolerated Other Position/Activity Restrictions: Has a L prosthetic, wears for brief periods/transfers only. General:    Therapy/Group: Individual Therapy  Orrin Brigham 01/09/2022, 7:38 AM

## 2022-01-10 LAB — GLUCOSE, CAPILLARY
Glucose-Capillary: 89 mg/dL (ref 70–99)
Glucose-Capillary: 84 mg/dL (ref 70–99)
Glucose-Capillary: 121 mg/dL — ABNORMAL HIGH (ref 70–99)
Glucose-Capillary: 116 mg/dL — ABNORMAL HIGH (ref 70–99)

## 2022-01-10 LAB — RENAL FUNCTION PANEL
Albumin: 2.7 g/dL — ABNORMAL LOW (ref 3.5–5.0)
Anion gap: 18 — ABNORMAL HIGH (ref 5–15)
BUN: 36 mg/dL — ABNORMAL HIGH (ref 6–20)
CO2: 25 mmol/L (ref 22–32)
Calcium: 9.1 mg/dL (ref 8.9–10.3)
Chloride: 91 mmol/L — ABNORMAL LOW (ref 98–111)
Creatinine, Ser: 5 mg/dL — ABNORMAL HIGH (ref 0.61–1.24)
GFR, Estimated: 13 mL/min — ABNORMAL LOW (ref >60)
Glucose, Bld: 85 mg/dL (ref 70–99)
Phosphorus: 3.1 mg/dL (ref 2.5–4.6)
Potassium: 4.3 mmol/L (ref 3.5–5.1)
Sodium: 134 mmol/L — ABNORMAL LOW (ref 135–145)

## 2022-01-10 NOTE — Progress Notes (Signed)
Wound vac removed per orders. Site cleansed with soap and water and covered with krilex due to frank red bleeding. Site with staples in place and areas of scabbing at surgical site. Patient tolerated removal well. Medicated following with PRN dilaudid with positive effect.

## 2022-01-10 NOTE — Progress Notes (Addendum)
PROGRESS NOTE   Subjective/Complaints: Patient seen in bed today is using Cymbalta for phantom limb pain.  Continue to watch CBGs with 1 value down to 47.  Patient complaining that his cell phone was lost a week ago on another unit.  ROS: + residual limb pain and right lower extremity, plus phantom limb pain in left lower extremity, denies chest pain   Objective:   No results found. No results for input(s): "WBC", "HGB", "HCT", "PLT" in the last 72 hours.  Recent Labs    01/10/22 0614  NA 134*  K 4.3  CL 91*  CO2 25  GLUCOSE 85  BUN 36*  CREATININE 5.00*  CALCIUM 9.1    Intake/Output Summary (Last 24 hours) at 01/10/2022 1603 Last data filed at 01/10/2022 0840 Gross per 24 hour  Intake 220 ml  Output 4003.8 ml  Net -3783.8 ml        Physical Exam: Vital Signs Blood pressure 103/69, pulse 64, temperature 98.3 F (36.8 C), temperature source Oral, resp. rate 18, height  (1.626 m), weight 98.6 kg, SpO2 100 %.   General: Alert and oriented x 3, No apparent distress, BMI 38.52 HEENT: Head is normocephalic, atraumatic, PERRLA, EOMI, sclera anicteric, oral mucosa pink and moist, dentition intact, ext ear canals clear,  Neck: Supple without JVD or lymphadenopathy Heart: Reg rate and rhythm. No murmurs rubs or gallops Chest: CTA bilaterally without wheezes, rales, or rhonchi; no distress Abdomen: Soft, non-tender, slightly protuberant versus distended, + normal active bowel sounds. Extremities: No clubbing, cyanosis, or edema. Pulses are 2+ Psych: Pt's affect is flat. Pt is cooperative Skin: Left BKA shows a great deal of scabs also and not knuckles with the worst being the left second PIP swollen and irritated with calciphylaxis noted.  IV in RUE looks okay lesion on right index finger Neuro: Alert to person place and time.  Decreased light touch from knees down bilaterally. Musculoskeletal: Cervical back: Supple  non-tender. Comments: UE strength 5 - forward 5 in biceps, triceps, WB grip and after a bilateral.  LE strength 5 -/5 on L EE and HF/KE/KF: And at least antigravity on RLE-due to pain cannot test against resistance wound VAC is still present. Left BKA with a lot of scar scabs pleasant black eschar on side of left BKA   Assessment/Plan: 1. Functional deficits which require 3+ hours per day of interdisciplinary therapy in a comprehensive inpatient rehab setting. Physiatrist is providing close team supervision and 24 hour management of active medical problems listed below. Physiatrist and rehab team continue to assess barriers to discharge/monitor patient progress toward functional and medical goals  Care Tool:  Bathing    Body parts bathed by patient: Right arm, Left arm, Chest, Abdomen, Right upper leg, Left upper leg, Face   Body parts bathed by helper: Front perineal area, Buttocks Body parts n/a: Right lower leg, Left lower leg (bilateral BKA)   Bathing assist Assist Level: Minimal Assistance - Patient > 75%     Upper Body Dressing/Undressing Upper body dressing   What is the patient wearing?: Pull over shirt    Upper body assist Assist Level: Set up assist    Lower Body  Dressing/Undressing Lower body dressing      What is the patient wearing?: Underwear/pull up, Pants     Lower body assist Assist for lower body dressing: Moderate Assistance - Patient 50 - 74%     Toileting Toileting Toileting Activity did not occur Press photographer and hygiene only): Refused  Toileting assist       Transfers Chair/bed transfer  Transfers assist     Chair/bed transfer assist level: Moderate Assistance - Patient 50 - 74% (slideboard)     Locomotion Ambulation   Ambulation assist   Ambulation activity did not occur: Safety/medical concerns (unable to use L prosthetic due to pain, weakness, RLE NWB)          Walk 10 feet activity   Assist  Walk 10 feet activity  did not occur: Safety/medical concerns (unable to use L prosthetic due to pain, weakness, RLE NWB)        Walk 50 feet activity   Assist Walk 50 feet with 2 turns activity did not occur: Safety/medical concerns (unable to use L prosthetic due to pain, weakness, RLE NWB)         Walk 150 feet activity   Assist Walk 150 feet activity did not occur: Safety/medical concerns (unable to use L prosthetic due to pain, weakness, RLE NWB)         Walk 10 feet on uneven surface  activity   Assist Walk 10 feet on uneven surfaces activity did not occur: Safety/medical concerns (unable to use L prosthetic due to pain, weakness, RLE NWB)         Wheelchair     Assist Is the patient using a wheelchair?: Yes Type of Wheelchair: Manual Wheelchair activity did not occur: Safety/medical concerns (pain, weakness)  Wheelchair assist level: Supervision/Verbal cueing Max wheelchair distance: 40    Wheelchair 50 feet with 2 turns activity    Assist    Wheelchair 50 feet with 2 turns activity did not occur: Safety/medical concerns (pain, weakness)   Assist Level: Moderate Assistance - Patient 50 - 74%   Wheelchair 150 feet activity     Assist  Wheelchair 150 feet activity did not occur: Safety/medical concerns (pain, weakness)       Blood pressure 103/69, pulse 64, temperature 98.3 F (36.8 C), temperature source Oral, resp. rate 18, height  (1.626 m), weight 98.6 kg, SpO2 100 %.  Medical Problem List and Plan: 1. 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             -patient may not shower until VAC removed             -ELOS/Goals: 7-10 days w/c level supervision to mod I  Continue CIR   2.  Impaired mobility: continue Heparin             -antiplatelet therapy: Plavix and aspirin 3. Residual limb pain: increased Dilaudid to  q4H prn.              --will continue oxycodone 15 mg q 4 hours and  consider methadone 2.5 mg in place of IV Dilaudid 6/24 Continue regimen for residual limb pain.             -d/c methadone since will be unable to follow-up with methadone clinic outpatient. 4. Insomnia:             -continue melatonin q HS 5. Neuropsych/cognition: This patient is capable of making decisions on his  own behalf. 6. Skin/Wound Care: Routine skin care checks             --wound vac removed             --monitor calciphylaxis skin lesions 6/24 wound vac is still present, care order by Dois Davenport, PA placed to remove wound vac dated 6/23.  Notified bedside nurse of care order. 7. Fluids/Electrolytes/Nutrition: Strict Is and Os             --HD/per nephrology 8: ESRD: HD on Monday/Wednesday/Friday                         --chronic hypotension on midodrine pre-HD 9: Essential hypertension: continue metoprolol 25 mg BID             -- Amlodipine discontinued 6/24 well controlled with metoprolol 25 mg BID    01/10/2022    2:34 PM 01/10/2022    5:32 AM 01/09/2022    7:48 PM  Vitals with BMI  Weight  217 lbs 6 oz   BMI  37.29   Systolic 103 117 161  Diastolic 69 80 86  Pulse 64 67 63    10: Peripheral arterial disease s/p intervention             --maintained on aspirin, Plavix and statin 11: DM-2 with hyperglycemia: continue CBGs q AC/HS             --Decrease Levemir to 4U   D/c Aspart with meals 6/24 had a hypoglycemic episode yesterday with novolog d/c'ed.  Continue to monitor and trend.  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 Vitamin D deficiency: start ergocalciferol 50,000U once per week for 7 weeks.  17. Depression: continue Cymbalta and Wellbutrin 6/24  Well-controlled. 18. Phantom limb pain: increase Cymbalta to 60mg  daily.  6/24 Continue 60 mg Cymbalta daily.    LOS: 4 days A FACE TO FACE EVALUATION WAS PERFORMED  Tressia Miners 01/10/2022, 4:03 PM

## 2022-01-10 NOTE — Progress Notes (Signed)
Physical Therapy Session Note  Patient Details  Name: Michael Riggs MRN: [id number] Date of Birth: 09-12-1970  Today's Date: 01/10/2022 PT Individual Time: 1620-1730 PT Individual Time Calculation (min): 70 min   Short Term Goals: Week 1:  PT Short Term Goal 1 (Week 1): STG=LTG due to LOS  Skilled Therapeutic Interventions/Progress Updates:   Pt received supine in bed and agreeable to PT. Supine>sit transfer with min assist and cues for safety and use of rails. Pt applied dressing to the LLE , as dressing fell off while pt in bed.   Slide board transfer to [facility] with mod assist and max cues for position on the board with posterior LOB noted. Pt performed WC mobility through hall x 26ft with cues for safety and control turning technique to the R.   Pt performed WC mobility over cement x 150ft and 77ft with cues for safety through turns and min assist for ascent up hill.   UBE 3 min forward/ 3 min reverse on random resistance cues for proper RPM and cues for pursed lip breathing.   Pt returned to room and performed A/P transfer to bed with min assist from PT with poor adherence of NWB on the RLE. Sit>supine completed without assist, and left supine in bed with call bell in reach and all needs met.           Therapy Documentation Precautions:  Precautions Precautions: Fall, Other (comment) Precaution Comments: multiple areas of tissue breakdown to B legs, elbows. Wound vac Required Braces or Orthoses: Other Brace Splint/Cast: limb protector when OOB Restrictions Weight Bearing Restrictions: Yes RLE Weight Bearing: Non weight bearing LLE Weight Bearing: Weight bearing as tolerated Other Position/Activity Restrictions: Has a L prosthetic, wears for brief periods/transfers only.  Vital Signs: Therapy Vitals Temp: 98.3 F (36.8 C) Temp Source: Oral Pulse Rate: 64 Resp: 18 BP: 103/69 Patient Position (if appropriate): Lying Oxygen Therapy SpO2: 100 % O2 Device: Room  Air Pain: Pain Assessment Pain Scale: 0-10 Pain Score: 8  Pain Type: Surgical pain Pain Location: Leg Pain Orientation: Right Pain Descriptors / Indicators: Aching Pain Intervention(s): Medication (See eMAR)    Therapy/Group: Individual Therapy  Golden Pop 01/10/2022, 5:39 PM

## 2022-01-11 LAB — GLUCOSE, CAPILLARY
Glucose-Capillary: 108 mg/dL — ABNORMAL HIGH (ref 70–99)
Glucose-Capillary: 82 mg/dL (ref 70–99)
Glucose-Capillary: 82 mg/dL (ref 70–99)
Glucose-Capillary: 97 mg/dL (ref 70–99)

## 2022-01-11 MED ORDER — CHLORHEXIDINE GLUCONATE CLOTH 2 % EX PADS: 6.0000 | MEDICATED_PAD | Freq: Every day | CUTANEOUS | Status: DC

## 2022-01-11 NOTE — Progress Notes (Addendum)
[facility] Progress Note   Subjective:   Patient seen and examined at bedside.  No specific complaints this AM.  Denies CP, SOB, abdominal pain and n/v/d.  Objective Vitals:   01/10/22 0532 01/10/22 1434 01/10/22 1933 01/11/22 0527  BP: 117/80 103/69 126/84 118/79  Pulse: 67 64 66 (!) 58  Resp: 18 18 18 18   Temp: (!) 97.5 F (36.4 C) 98.3 F (36.8 C) 97.7 F (36.5 C) 98.2 F (36.8 C)  TempSrc: Oral Oral Oral Oral  SpO2: 95% 100% 100% 99%  Weight: 98.6 kg   98.6 kg  Height:       Physical Exam General:chronically ill appearing male in NAD Heart:RRR, no mrg Lungs:CTAB, nml WOB on RA Abdomen:soft, NTND Extremities:b/l BKA, no edema, calciphylaxis wounds on b/l extremities  Dialysis Access: LU AVF +b/t   Filed Weights   01/09/22 1846 01/10/22 0532 01/11/22 0527  Weight: 98.5 kg 98.6 kg 98.6 kg    Intake/Output Summary (Last 24 hours) at 01/11/2022 0949 Last data filed at 01/11/2022 0730 Gross per 24 hour  Intake 530 ml  Output --  Net 530 ml    Additional Objective Labs: Basic Metabolic Panel: Recent Labs  Lab 01/05/22 0825 01/07/22 1330 01/10/22 0614  NA 130* 135 134*  K 5.0 5.3* 4.3  CL 89* 89* 91*  CO2 20* 22 25  GLUCOSE 133* 126* 85  BUN 102* 76* 36*  CREATININE 8.11* 7.86* 5.00*  CALCIUM 9.1 9.7 9.1  PHOS 6.3* 4.7* 3.1   Liver Function Tests: Recent Labs  Lab 01/05/22 0825 01/07/22 1330 01/10/22 0614  ALBUMIN 2.6* 2.7* 2.7*   CBC: Recent Labs  Lab 01/05/22 0825 01/07/22 1200  WBC 13.0* 13.4*  HGB 11.3* 11.1*  HCT 33.9* 33.9*  MCV 105.0* 104.0*  PLT 239 215    CBG: Recent Labs  Lab 01/10/22 0554 01/10/22 1155 01/10/22 1637 01/10/22 2050 01/11/22 0558  GLUCAP 89 84 121* 116* 108*    Medications:  sodium thiosulfate 25 g in sodium chloride 0.9 % 200 mL Infusion for Calciphylaxis 25 g (01/09/22 1618)    vitamin C  1,000 mg Oral Daily   aspirin EC  81 mg Oral Daily   atorvastatin  40 mg Oral QHS    bromocriptine  2.5 mg Oral QHS   buPROPion  300 mg Oral q AM   Chlorhexidine Gluconate Cloth  6 each Topical Q0600   clopidogrel  75 mg Oral Daily   DULoxetine  60 mg Oral Daily   enoxaparin (LOVENOX) injection  30 mg Subcutaneous Q24H   ferric citrate  630 mg Oral TID WC   insulin aspart  0-6 Units Subcutaneous TID WC   insulin detemir  4 Units Subcutaneous Daily   melatonin  10 mg Oral QHS   metoprolol tartrate  25 mg Oral BID   midodrine  10 mg Oral Q M,W,F   multivitamin  1 tablet Oral QHS   nutrition supplement (JUVEN)  1 packet Oral BID BM   pantoprazole  40 mg Oral Daily   tamsulosin  0.4 mg Oral QHS   urea   Topical BID   zinc sulfate  220 mg Oral Daily    Dialysis Orders: [facility] on MWF 180NRe 5 hours BFR 400 DFR 500 EDW 98.4kg 2K 2Ca AVF 15g needles - No heparin/ESA - Calcitriol 1 mcg PO q HD   Assessment/Plan: Chronic RLE wound/osteomyelitis: Failed limb salvage attempts, 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 Dressing in place. Per ortho. ABX have been discontinued.  ESRD: Continue HD on usual MWF schedule. Renal diet.  BP/volume: BP now low normal.  Amlodipine d/c'd. Remains on metop BID with holding parameters. Using midodrine pre-HD. Close to previous EDW post HD Friday. H/O high IDWG. Pulling high goals now which is tolerating - UF 4L yesterday. EDW will need to be adjusted s/p amputation on discharge. Continue to titrate down as tolerated.  Anemia of ESRD: last Hgb 11.1. no ESA indicated    Secondary Hyperparathyroidism: CorrCa on high side.  Ergocalciferol, Tums and calcitriol discontinued for hypercalcemia.  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 On Sodium Thiosulfate 25 gram with HD TIW. Stopped VDRA. Unfortunately we just have 2.5 calcium bath here but will need 2.0 calcium bath outpatient.   T2DM- per primary.  Debility-Now in [facility]  Rehab  Disposition - per primary team  Virgina Norfolk, PA-C [facility] 01/11/2022,9:49 AM  LOS: 5 days    Seen and examined independently.  Agree with note and exam as documented above by physician extender and as noted here.  Really doesn't like renal diet - he's able to cheat here and there at home but not here.    General adult male in bed in no acute distress HEENT normocephalic atraumatic extraocular movements intact sclera anicteric Neck supple trachea midline Lungs clear to auscultation bilaterally normal work of breathing at rest  Heart S1S2 no rub Abdomen soft nontender distended/obese habitus Extremities bilateral lower extremity BKA's with no residual limb edema  Psych normal mood and affect Access: LUE AVF   Chronic RLE wound/osteomyelitis: Failed limb salvage attempts, 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 Wound vac in place. Per ortho. ABX have been discontinued.  ESRD: Continue HD on usual MWF schedule. Renal diet. will need to try to lower EDW as he's at his old EDW and has had an amputation in the interim between now and this weight being established.  Previously had been above old EDW. Calciphylaxis - on sodium thiosulfate and off of calcitriol   Estanislado Emms, MD 01/11/2022  11:48 AM

## 2022-01-11 NOTE — Progress Notes (Signed)
PROGRESS NOTE   Subjective/Complaints: Patient seen in bed today is using Cymbalta for phantom limb pain.  Today not complaining of phantom limb pain. No low CBG's overnight.  ROS: + residual limb pain and right lower extremity, plus phantom limb pain in left lower extremity, denies chest pain   Objective:   No results found. No results for input(s): "WBC", "HGB", "HCT", "PLT" in the last 72 hours.  Recent Labs    01/10/22 0614  NA 134*  K 4.3  CL 91*  CO2 25  GLUCOSE 85  BUN 36*  CREATININE 5.00*  CALCIUM 9.1    Intake/Output Summary (Last 24 hours) at 01/11/2022 1351 Last data filed at 01/11/2022 0730 Gross per 24 hour  Intake 530 ml  Output --  Net 530 ml        Physical Exam: Vital Signs Blood pressure 118/79, pulse (!) 58, temperature 98.2 F (36.8 C), temperature source Oral, resp. rate 18, height  (1.626 m), weight 98.6 kg, SpO2 99 %.   General: Alert and oriented x 3, No apparent distress, BMI 38.52 HEENT: Head is normocephalic, atraumatic, PERRLA, EOMI, sclera anicteric, oral mucosa pink and moist, dentition intact, ext ear canals clear,  Neck: Supple without JVD or lymphadenopathy Heart: Reg rate and rhythm. No murmurs rubs or gallops Chest: CTA bilaterally without wheezes, rales, or rhonchi; no distress Abdomen: Soft, non-tender, slightly protuberant versus distended, + normal active bowel sounds. Extremities: No clubbing, cyanosis, or edema. Pulses are 2+ Psych: Pt's affect is flat. Pt is cooperative Skin: Left BKA shows a great deal of scabs also and not knuckles with the worst being the left second PIP swollen and irritated with calciphylaxis noted.  IV in RUE looks okay lesion on right index finger Neuro: Alert to person place and time.  Decreased light touch from knees down bilaterally. Musculoskeletal: Cervical back: Supple non-tender. Comments: UE strength 5 - forward 5 in biceps,  triceps, WB grip and after a bilateral.  LE strength 5 -/5 on L EE and HF/KE/KF: And at least antigravity on RLE-due to pain cannot test against resistance.  Left BKA with a lot of scar scabs pleasant black eschar on side of left BKA. Wound vac removed yesterday.   Assessment/Plan: 1. Functional deficits which require 3+ hours per day of interdisciplinary therapy in a comprehensive inpatient rehab setting. Physiatrist is providing close team supervision and 24 hour management of active medical problems listed below. Physiatrist and rehab team continue to assess barriers to discharge/monitor patient progress toward functional and medical goals  Care Tool:  Bathing    Body parts bathed by patient: Right arm, Left arm, Chest, Abdomen, Right upper leg, Left upper leg, Face   Body parts bathed by helper: Front perineal area, Buttocks Body parts n/a: Right lower leg, Left lower leg (bilateral BKA)   Bathing assist Assist Level: Minimal Assistance - Patient > 75%     Upper Body Dressing/Undressing Upper body dressing   What is the patient wearing?: Pull over shirt    Upper body assist Assist Level: Set up assist    Lower Body Dressing/Undressing Lower body dressing      What is the patient wearing?:  Underwear/pull up, Pants     Lower body assist Assist for lower body dressing: Moderate Assistance - Patient 50 - 74%     Toileting Toileting Toileting Activity did not occur Press photographer and hygiene only): Refused  Toileting assist       Transfers Chair/bed transfer  Transfers assist     Chair/bed transfer assist level: Moderate Assistance - Patient 50 - 74% (slideboard)     Locomotion Ambulation   Ambulation assist   Ambulation activity did not occur: Safety/medical concerns (unable to use L prosthetic due to pain, weakness, RLE NWB)          Walk 10 feet activity   Assist  Walk 10 feet activity did not occur: Safety/medical concerns (unable to use L  prosthetic due to pain, weakness, RLE NWB)        Walk 50 feet activity   Assist Walk 50 feet with 2 turns activity did not occur: Safety/medical concerns (unable to use L prosthetic due to pain, weakness, RLE NWB)         Walk 150 feet activity   Assist Walk 150 feet activity did not occur: Safety/medical concerns (unable to use L prosthetic due to pain, weakness, RLE NWB)         Walk 10 feet on uneven surface  activity   Assist Walk 10 feet on uneven surfaces activity did not occur: Safety/medical concerns (unable to use L prosthetic due to pain, weakness, RLE NWB)         Wheelchair     Assist Is the patient using a wheelchair?: Yes Type of Wheelchair: Manual Wheelchair activity did not occur: Safety/medical concerns (pain, weakness)  Wheelchair assist level: Supervision/Verbal cueing Max wheelchair distance: 40    Wheelchair 50 feet with 2 turns activity    Assist    Wheelchair 50 feet with 2 turns activity did not occur: Safety/medical concerns (pain, weakness)   Assist Level: Moderate Assistance - Patient 50 - 74%   Wheelchair 150 feet activity     Assist  Wheelchair 150 feet activity did not occur: Safety/medical concerns (pain, weakness)       Blood pressure 118/79, pulse (!) 58, temperature 98.2 F (36.8 C), temperature source Oral, resp. rate 18, height  (1.626 m), weight 98.6 kg, SpO2 99 %.  Medical Problem List and Plan: 1. 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             -patient may not shower until VAC removed             -ELOS/Goals: 7-10 days w/c level supervision to mod I  Continue CIR   2.  Impaired mobility: continue Heparin             -antiplatelet therapy: Plavix and aspirin 3. Residual limb pain: increased Dilaudid to  q4H prn.              --will continue oxycodone 15 mg q 4 hours and consider methadone 2.5 mg in place of IV Dilaudid 6/25  Continue regimen for residual limb pain.             -d/c methadone since will be unable to follow-up with methadone clinic outpatient. 4. Insomnia:             -continue melatonin q HS 5. Neuropsych/cognition: This patient is capable of making decisions on his own behalf. 6. Skin/Wound Care: Routine skin care checks             --  wound vac removed             --monitor calciphylaxis skin lesions 6/24 wound vac is still present, care order by Dois Davenport, PA placed to remove wound vac dated 6/23.  Notified bedside nurse of care order. 6/25 Wound vac off, dry dressing on stump and stocking. 7. Fluids/Electrolytes/Nutrition: Strict Is and Os             --HD/per nephrology 8: ESRD: HD on Monday/Wednesday/Friday                         --chronic hypotension on midodrine pre-HD 9: Essential hypertension: continue metoprolol 25 mg BID             -- Amlodipine discontinued 6/25 well controlled with metoprolol 25 mg BID    01/11/2022    5:27 AM 01/10/2022    7:33 PM 01/10/2022    2:34 PM  Vitals with BMI  Weight 217 lbs 6 oz    BMI 37.29    Systolic 118 126 562  Diastolic 79 84 69  Pulse 58 66 64    10: Peripheral arterial disease s/p intervention             --maintained on aspirin, Plavix and statin 11: DM-2 with hyperglycemia: continue CBGs q AC/HS             --Decrease Levemir to 4U   D/c Aspart with meals 6/24 had a hypoglycemic episode yesterday with novolog d/c'ed.  Continue to monitor and trend. 6/25 CBG's okay range of 82-116.  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 Vitamin D deficiency: start ergocalciferol 50,000U once per week for 7 weeks.  17. Depression: continue  Cymbalta and Wellbutrin 6/25 Well-controlled. 18. Phantom limb pain: increase Cymbalta to 60mg  daily.  6/25 Continue 60 mg Cymbalta daily.    LOS: 5 days A FACE TO FACE EVALUATION WAS PERFORMED  Tressia Miners 01/11/2022, 1:51 PM

## 2022-01-12 LAB — RENAL FUNCTION PANEL
Albumin: 2.9 g/dL — ABNORMAL LOW (ref 3.5–5.0)
Anion gap: 20 — ABNORMAL HIGH (ref 5–15)
BUN: 64 mg/dL — ABNORMAL HIGH (ref 6–20)
CO2: 26 mmol/L (ref 22–32)
Calcium: 10 mg/dL (ref 8.9–10.3)
Chloride: 85 mmol/L — ABNORMAL LOW (ref 98–111)
Creatinine, Ser: 8.35 mg/dL — ABNORMAL HIGH (ref 0.61–1.24)
GFR, Estimated: 7 mL/min — ABNORMAL LOW (ref >60)
Glucose, Bld: 83 mg/dL (ref 70–99)
Phosphorus: 5.8 mg/dL — ABNORMAL HIGH (ref 2.5–4.6)
Potassium: 5.2 mmol/L — ABNORMAL HIGH (ref 3.5–5.1)
Sodium: 131 mmol/L — ABNORMAL LOW (ref 135–145)

## 2022-01-12 LAB — GLUCOSE, CAPILLARY
Glucose-Capillary: 82 mg/dL (ref 70–99)
Glucose-Capillary: 139 mg/dL — ABNORMAL HIGH (ref 70–99)
Glucose-Capillary: 76 mg/dL (ref 70–99)

## 2022-01-12 MED ORDER — HEPARIN SODIUM (PORCINE) 1000 UNIT/ML IJ SOLN
3000.0000 [IU] | Freq: Once | INTRAMUSCULAR | Status: AC
  Administered 2022-01-12: 3000 [IU] via INTRAVENOUS

## 2022-01-12 MED ORDER — HEPARIN SODIUM (PORCINE) 1000 UNIT/ML IJ SOLN
INTRAMUSCULAR | Status: AC
  Filled 2022-01-12: qty 3

## 2022-01-12 NOTE — Progress Notes (Signed)
Notified by OT of wound on L BKA. Patient stated that wound started on accute side when he banged it against something. This nurse observed wound over weekend and noted that there was a skin tear,but healing in the right direction. Today nursing noted a wound that has worsened in the last 24 hours. Pt stump is cool to touch, wound has become bigger and skin continues to avulse from wound. Nurse wrapped wound in non-adhesive dressing and kerlix. WOC consulted, MD notfied.Nursing will continue to monitor.

## 2022-01-12 NOTE — Progress Notes (Signed)
[name], male   DOB: 1970/10/10, 51 y.o.   MRN: [id number]  Drop Arm Commode ordered through [employer].

## 2022-01-13 LAB — GLUCOSE, CAPILLARY
Glucose-Capillary: 64 mg/dL — ABNORMAL LOW (ref 70–99)
Glucose-Capillary: 72 mg/dL (ref 70–99)
Glucose-Capillary: 84 mg/dL (ref 70–99)
Glucose-Capillary: 115 mg/dL — ABNORMAL HIGH (ref 70–99)
Glucose-Capillary: 109 mg/dL — ABNORMAL HIGH (ref 70–99)

## 2022-01-13 MED ORDER — CHLORHEXIDINE GLUCONATE CLOTH 2 % EX PADS
6.0000 | MEDICATED_PAD | Freq: Every day | CUTANEOUS | Status: DC
  Administered 2022-01-15: 6 via TOPICAL

## 2022-01-13 MED ORDER — AMITRIPTYLINE HCL 10 MG PO TABS
10.0000 mg | ORAL_TABLET | Freq: Every day | ORAL | Status: DC
  Administered 2022-01-13 – 2022-01-16 (×4): 10 mg via ORAL
  Filled 2022-01-13 (×4): qty 1

## 2022-01-13 NOTE — Progress Notes (Signed)
Physical Therapy Session Note  Patient Details  Name: Michael Riggs MRN: [id number] Date of Birth: 1971-04-28  Today's Date: 01/13/2022 PT Individual Time: 1132-1202 PT Individual Time Calculation (min): 30 min   Short Term Goals: Week 1:  PT Short Term Goal 1 (Week 1): STG=LTG due to LOS  Skilled Therapeutic Interventions/Progress Updates:    Patient in supine and hyperfocused on recovering his phone.  Requesting to stay in supine for now so performed LE and UE therex as noted below.  Patient rolling in bed for hip extension and abduction with S using rails.  Patient left supine with all needs in reach and bed alarm active.   Therapy Documentation Precautions:  Precautions Precautions: Fall, Other (comment) Precaution Comments: multiple areas of tissue breakdown to B legs, elbows. Wound vac Required Braces or Orthoses: Other Brace Splint/Cast: limb protector when OOB Restrictions Weight Bearing Restrictions: Yes RLE Weight Bearing: Non weight bearing LLE Weight Bearing: Weight bearing as tolerated Other Position/Activity Restrictions: Has a L prosthetic, wears for brief periods/transfers only.  Pain: Pain Assessment Pain Score: 7  Pain Location: Leg Pain Orientation: Left Pain Descriptors / Indicators: Other (Comment) (phantom pain) Pain Onset: With Activity Pain Intervention(s): Repositioned    Exercises: General Exercises - Upper Extremity Shoulder ABduction: Strengthening;Both;10 reps;Supine;Theraband Theraband Level (Shoulder Abduction): Level 4 (Blue) Elbow Flexion: Strengthening;Supine;Both;10 reps Theraband Level (Elbow Flexion): Level 4 (Blue) Elbow Extension: Strengthening;Both;10 reps;Supine;Theraband Theraband Level (Elbow Extension): Level 4 (Blue) General Exercises - Lower Extremity Short Arc Quad: Strengthening;Both;10 reps;Supine (5 sec hold) Hip ABduction/ADduction: Strengthening;Both;10 reps;Sidelying Amputee Exercises Towel Squeeze:  Strengthening;Both;10 reps;Supine (5 sec hold) Hip Extension: Strengthening;Both;10 reps;Sidelying;AAROM Trunk Strengthening: supine crunches x 10 reaching for legs Other Exercises Other Exercises: bridging with legs over two pillows w/ 5 sec hold x 10   Therapy/Group: Individual Therapy  Elray Mcgregor Sheran Lawless, PT 01/13/2022, 1:39 PM

## 2022-01-13 NOTE — Progress Notes (Signed)
Occupational Therapy Session Note  Patient Details  Name: Michael Riggs MRN: [id number] Date of Birth: March 03, 1971  Today's Date: 01/13/2022 OT Individual Time: 1005-1100 OT Individual Time Calculation (min): 55 min    Short Term Goals: Week 1:  OT Short Term Goal 1 (Week 1): STG = LTG 2/2 ELOS  Skilled Therapeutic Interventions/Progress Updates:  Skilled OT intervention completed with focus on limb care, functional w/c mobility for bathroom and bedroom transfers, a well as d/c planning. Pt received seated in PWC, no c/o pain however reporting extreme fatigue with request to return to bed. With mod encouragement, pt agreeable to seated tasks and focus on w/c mobility until he can nap and gain some energy. Discussed with pt the reasons for fatigue, with education provided on dialysis related fatigue on day after treatment, as well as pt reporting being woken up frequently throughout the night with therapist communicating to care team about potential with placing sign outside door to minimize nightly interruptions for there purpose of maximizing therapy participation.  Pt donned limb guard on R limb with min A, after education provided on modifying strap management 2/2 poor fine motor sensation/strength. Applied pillow to bilateral elevating leg rests to keep BKA elevated for edema management as well as skin integrity vs limbs hanging down on each side of his chair. Nursing in room to wrap the limb prior for further skin integrity.   With min A for navigating joystick and PWC controls, pt self-transported in PWC to day room. Cues needed for turns and avoiding hallway objects for safety. To promote navigation of PWC within doorways and during home management, pt participated in obstacle coarse with set of 6 cones including weaving in and out, down/back x2. Initially max cues and visual demo needed for making wider turns and avoiding collision with cones, however assisting fading to min verbal  cues for technique. Pt would benefit from further practice to improve safety and independence with this. With supervision pt navigated in PWC back to room, then min A slideboard transfer > L with dependent positioning of PWC next to bed and placement of board prior. Pt with fatigue and laying back immediately supine vs getting off board completely, with minimal carryover of how this is not safe during transfers however pt able to reposition self with supervision. Pt was left semi-supine, with bed alarm on and all needs in reach at end of session.   Therapy Documentation Precautions:  Precautions Precautions: Fall, Other (comment) Precaution Comments: multiple areas of tissue breakdown to B legs, elbows. Wound vac Required Braces or Orthoses: Other Brace Splint/Cast: limb protector when OOB Restrictions Weight Bearing Restrictions: Yes RLE Weight Bearing: Non weight bearing LLE Weight Bearing: Weight bearing as tolerated Other Position/Activity Restrictions: Has a L prosthetic, wears for brief periods/transfers only.    Therapy/Group: Individual Therapy  Mahogani Holohan E Kj Imbert, MS, OTR/L  01/13/2022, 7:30 AM

## 2022-01-14 LAB — CBC
HCT: 35.4 % — ABNORMAL LOW (ref 39.0–52.0)
Hemoglobin: 11.4 g/dL — ABNORMAL LOW (ref 13.0–17.0)
MCH: 33.7 pg (ref 26.0–34.0)
MCHC: 32.2 g/dL (ref 30.0–36.0)
MCV: 104.7 fL — ABNORMAL HIGH (ref 80.0–100.0)
Platelets: 178 10*3/uL (ref 150–400)
RBC: 3.38 MIL/uL — ABNORMAL LOW (ref 4.22–5.81)
RDW: 15.4 % (ref 11.5–15.5)
WBC: 11.5 10*3/uL — ABNORMAL HIGH (ref 4.0–10.5)
nRBC: 0 % (ref 0.0–0.2)

## 2022-01-14 LAB — RENAL FUNCTION PANEL
Albumin: 2.8 g/dL — ABNORMAL LOW (ref 3.5–5.0)
Anion gap: 20 — ABNORMAL HIGH (ref 5–15)
BUN: 53 mg/dL — ABNORMAL HIGH (ref 6–20)
CO2: 23 mmol/L (ref 22–32)
Calcium: 9.4 mg/dL (ref 8.9–10.3)
Chloride: 87 mmol/L — ABNORMAL LOW (ref 98–111)
Creatinine, Ser: 7.43 mg/dL — ABNORMAL HIGH (ref 0.61–1.24)
GFR, Estimated: 8 mL/min — ABNORMAL LOW (ref >60)
Glucose, Bld: 118 mg/dL — ABNORMAL HIGH (ref 70–99)
Phosphorus: 4.4 mg/dL (ref 2.5–4.6)
Potassium: 4.8 mmol/L (ref 3.5–5.1)
Sodium: 130 mmol/L — ABNORMAL LOW (ref 135–145)

## 2022-01-14 LAB — GLUCOSE, CAPILLARY
Glucose-Capillary: 103 mg/dL — ABNORMAL HIGH (ref 70–99)
Glucose-Capillary: 108 mg/dL — ABNORMAL HIGH (ref 70–99)

## 2022-01-14 MED ORDER — METOPROLOL TARTRATE 12.5 MG HALF TABLET
12.5000 mg | ORAL_TABLET | Freq: Two times a day (BID) | ORAL | Status: DC
Start: 2022-01-14 — End: 2022-01-17
  Administered 2022-01-14 – 2022-01-16 (×5): 12.5 mg via ORAL
  Filled 2022-01-14 (×6): qty 1

## 2022-01-14 MED ORDER — LIDOCAINE HCL (PF) 1 % IJ SOLN: 5.0000 mL | INTRAMUSCULAR | Status: DC | PRN

## 2022-01-14 MED ORDER — LIDOCAINE-PRILOCAINE 2.5-2.5 % EX CREA: 1.0000 | TOPICAL_CREAM | CUTANEOUS | Status: DC | PRN

## 2022-01-14 MED ORDER — HYDROMORPHONE HCL 2 MG PO TABS
4.0000 mg | ORAL_TABLET | Freq: Four times a day (QID) | ORAL | Status: DC | PRN
  Administered 2022-01-14 – 2022-01-15 (×3): 4 mg via ORAL
  Filled 2022-01-14 (×3): qty 2

## 2022-01-14 MED ORDER — PENTAFLUOROPROP-TETRAFLUOROETH EX AERO: 1.0000 | INHALATION_SPRAY | CUTANEOUS | Status: DC | PRN

## 2022-01-14 NOTE — Patient Care Conference (Signed)
Inpatient RehabilitationTeam Conference and Plan of Care Update Date: 01/14/2022   Time: 11:36 AM    Patient Name: Michael Riggs      Medical Record Number: [id number]  Date of Birth: 10-19-1970 Sex: Male         Room/Bed: 4W06C/4W06C-01 Payor Info: Payor: MEDICARE / Plan: MEDICARE PART A AND B / Product Type: *No Product type* /    Admit Date/Time:  01/06/2022  5:19 PM  Primary Diagnosis:  Below-knee amputation of right lower extremity [facility])  Hospital Problems: Principal Problem:   Below-knee amputation of right lower extremity ([place]) Active Problems:   ESRD (end stage renal disease) ([place])   Status post below-knee amputation of left lower extremity [facility])    Expected Discharge Date: Expected Discharge Date: 02/05/2022  Team Members Present: Physician leading conference: Dr. Leeroy Cha Social Worker Present: Erlene Quan, BSW Nurse Present: Dorien Chihuahua, RN PT Present: Page Spiro, PT OT Present: Jennefer Bravo, OT SLP Present: Helaine Chess, SLP PPS Coordinator present : Ileana Ladd, PT     Current Status/Progress Goal Weekly Team Focus  Bowel/Bladder   patient is on HD, cont of BM. last BM 6/27  remain cont of BM  assess q shift and PRN   Swallow/Nutrition/ Hydration             ADL's   Min A bathing, set up A UB dressing, Mod A LB dressing, fluctuates with transfers with Min A to supervision slideboard toilet transfers. Pt has progressed transfers overall, however is limited in sessions 2/2 self-limiting behaviors, fixation on the situation with his lost phone and housing plans with pt very tangential so it's been challenging to progress  Mod I, Supervision, plan to downgrade all goals to supervision vs mod I  functional endurance and transfers, shrinker/limb care education, PWC mobility   Mobility   Min A bed mobility, CGA-min A lateral scoot and slide board transfers, supervision power wheelchair mobility >100 ft, power w/c eval performed on 6/27 with  [employer]  supervision  D/c planning, patient/caregiver education, functional mobility, strength/endurance/ROM, amputee education, sitting balance, power wheelchair mobility   Communication             Safety/Cognition/ Behavioral Observations            Pain   pain 8 of 10, pain managment in progress, effective  pain<3  assess pain q shift and PRN   Skin   multiple scabs, R BKA- staples. old L BKA- wound dry  No new breakdowns, proper healing of SX site  assess skin q shift and PRN     Discharge Planning:  Discharging home with assistance from mother, SIL and brother.   Team Discussion: Patient's pain improved. Left LE necrotic; skin sloughing; MD aware. Now off all insulin. Exhibits some self limiting behaviors, STMD with cognitive deficits and function fluctuates with fatigue.  Patient on target to meet rehab goals: Currently needs min assist for bathing and mod assist for lower body care. Complete slide board lat scoots with min assist but needs max assist for toileting. Goals for discharge set for supervision overall however anticipate he will not meet supervision level.  *See Care Plan and progress notes for long and short-term goals.   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, calciphyxis, postoperative pain, type 2 diabetes mellitus  Barriers to Discharge: Medical stability;Wound care;Hemodialysis  Barriers to Discharge Comments: residual limb pain of right lower extremity, 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,  calciphyxis, postoperative pain, type 2 diabetes mellitus Possible Resolutions to [employer] Focus: continue to monitor incision and provide daily wound care, continue HD, decrease Dilaudid to 4mg  q6H prn, continue to monitor CBGs   Continued Need for Acute Rehabilitation Level of Care: The patient requires daily medical management by a physician with specialized training in physical medicine and rehabilitation for the following reasons: Direction of a multidisciplinary physical rehabilitation program to maximize functional independence : Yes Medical management of patient stability for increased activity during participation in an intensive rehabilitation regime.: Yes Analysis of laboratory values and/or radiology reports with any subsequent need for medication adjustment and/or medical intervention. : Yes   I attest that I was present, lead the team conference, and concur with the assessment and plan of the team.   Margarito Liner 01/14/2022, 6:25 PM

## 2022-01-14 NOTE — Procedures (Signed)
   I was present at this dialysis session, have reviewed the session itself and made  appropriate changes Kelly Splinter MD [place] pager [phone]   01/14/2022, 5:20 PM

## 2022-01-14 NOTE — Progress Notes (Signed)
Physical Therapy Note  Patient Details  Name: Michael Riggs MRN: [id number] Date of Birth: [birth year] Today's Date: 01/14/2022    Patient in bed asleep upon PT arrival. Patient slow to arouse and requested 15 min to rest due to pain/fatigue from recent OT session. PT Returned x15 min later, patient asleep in bed. Patient again slow to arouse and declined all therapeutic interventions at this time, per OT, patient attempted to decline interventions this morning as well. Patient lethargic and falling asleep while talking to PT. Patient only reporting fatigue and pain at this time, RN made aware. Patient missed 60 min of skilled PT due to pain/fatigue, RN made aware. Will attempt to make-up missed time as able.     Taron Mondor L Maryruth Apple PT, DPT  01/14/2022, 11:47 AM

## 2022-01-14 NOTE — Progress Notes (Signed)
PROGRESS NOTE   Subjective/Complaints: CBGs improved Team conference today +fatigue- refused OT yesterday Consulted wound care and no further recommendations for LLE  ROS: + residual limb pain in right lower extremity, plus phantom limb pain in left lower extremity, denies chest pain, shortness of breath, constipation, +fatigue   Objective:   No results found. No results for input(s): "WBC", "HGB", "HCT", "PLT" in the last 72 hours.  Recent Labs    01/12/22 0628  NA 131*  K 5.2*  CL 85*  CO2 26  GLUCOSE 83  BUN 64*  CREATININE 8.35*  CALCIUM 10.0    Intake/Output Summary (Last 24 hours) at 01/14/2022 0926 Last data filed at 01/14/2022 9628 Gross per 24 hour  Intake 653 ml  Output --  Net 653 ml        Physical Exam: Vital Signs Blood pressure 117/67, pulse 65, temperature 98 F (36.7 C), resp. rate 18, height 5\' 4"  (1.626 m), weight 96 kg, SpO2 100 %.   General: Alert and oriented x 3, No apparent distress, BMI 36.33, fatigued HEENT: Head is normocephalic, atraumatic, PERRLA, EOMI, sclera anicteric, oral mucosa pink and moist, dentition intact, ext ear canals clear,  Neck: Supple without JVD or lymphadenopathy Heart: Bradycardic. No murmurs rubs or gallops Chest: CTA bilaterally without wheezes, rales, or rhonchi; no distress Abdomen: Soft, non-tender, slightly protuberant versus distended, + normal active bowel sounds. Extremities: No clubbing, cyanosis, or edema. Pulses are 2+ Psych: Pt's affect is flat. 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  IV in RUE looks okay lesion on right index finger   Neuro: Alert to person place and time.  Decreased light touch from knees down bilaterally. Musculoskeletal: Cervical back: Supple non-tender. Comments: UE strength 5 -  forward 5 in biceps, triceps, WB grip and after a bilateral.  LE strength 5 -/5 on L EE and HF/KE/KF: And at least antigravity on RLE-due to pain cannot test against resistance.  Left BKA with a lot of scar scabs pleasant black eschar on side of left BKA. Wound vac removed yesterday.   Assessment/Plan: 1. Functional deficits which require 3+ hours per day of interdisciplinary therapy in a comprehensive inpatient rehab setting. Physiatrist is providing close team supervision and 24 hour management of active medical problems listed below. Physiatrist and rehab team continue to assess barriers to discharge/monitor patient progress toward functional and medical goals  Care Tool:  Bathing    Body parts bathed by patient: Right arm, Left arm, Chest, Abdomen, Right upper leg, Left upper leg, Face   Body parts bathed by helper: Front perineal area, Buttocks Body parts n/a: Right lower leg, Left lower leg (bilateral BKA)   Bathing assist Assist Level: Minimal Assistance - Patient > 75%     Upper Body Dressing/Undressing Upper body dressing   What is the patient wearing?: Pull over shirt    Upper body assist Assist Level: Set up assist    Lower Body Dressing/Undressing Lower body dressing      What is the patient wearing?: Orthosis (limb guard)  Lower body assist Assist for lower body dressing: Minimal Assistance - Patient > 75%     Toileting Toileting Toileting Activity did not occur Landscape architect and hygiene only): Refused  Toileting assist       Transfers Chair/bed transfer  Transfers assist     Chair/bed transfer assist level: Moderate Assistance - Patient 50 - 74% (slideboard)     Locomotion Ambulation   Ambulation assist   Ambulation activity did not occur: Safety/medical concerns (unable to use L prosthetic due to pain, weakness, RLE NWB)          Walk 10 feet activity   Assist  Walk 10 feet activity did not occur: Safety/medical concerns  (unable to use L prosthetic due to pain, weakness, RLE NWB)        Walk 50 feet activity   Assist Walk 50 feet with 2 turns activity did not occur: Safety/medical concerns (unable to use L prosthetic due to pain, weakness, RLE NWB)         Walk 150 feet activity   Assist Walk 150 feet activity did not occur: Safety/medical concerns (unable to use L prosthetic due to pain, weakness, RLE NWB)         Walk 10 feet on uneven surface  activity   Assist Walk 10 feet on uneven surfaces activity did not occur: Safety/medical concerns (unable to use L prosthetic due to pain, weakness, RLE NWB)         Wheelchair     Assist Is the patient using a wheelchair?: Yes Type of Wheelchair: Manual Wheelchair activity did not occur: Safety/medical concerns (pain, weakness)  Wheelchair assist level: Supervision/Verbal cueing Max wheelchair distance: 40    Wheelchair 50 feet with 2 turns activity    Assist    Wheelchair 50 feet with 2 turns activity did not occur: Safety/medical concerns (pain, weakness)   Assist Level: Moderate Assistance - Patient 50 - 74%   Wheelchair 150 feet activity     Assist  Wheelchair 150 feet activity did not occur: Safety/medical concerns (pain, weakness)       Blood pressure 117/67, pulse 65, temperature 98 F (36.7 C), resp. rate 18, height 5\' 4"  (1.626 m), weight 96 kg, SpO2 100 %.  Medical Problem List and Plan: 1. 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             -patient may not shower until VAC removed             -ELOS/Goals: 7-10 days w/c level supervision to mod I  Continue CIR  -Interdisciplinary Team Conference today    2.  Impaired mobility: continue Heparin             -antiplatelet therapy: Plavix and aspirin 3. Residual limb pain: decrease Dilaudid to 4mg  q6H prn.              --will continue oxycodone 15 mg q 4 hours and consider methadone 2.5 mg in  place of IV Dilaudid             -d/c methadone since will be unable to follow-up with methadone clinic outpatient. 4. Insomnia:             -continue melatonin q HS 5. Neuropsych/cognition: This patient is capable of making decisions on his own behalf. 6. Calciphylaxis skin lesions, discussed with nephrology and no derm/surgical consult recommended. Will consult wound care for recommendations as well.  7. Fluids/Electrolytes/Nutrition: Strict  Is and Os             --HD/per nephrology 8: ESRD: HD on Monday/Wednesday/Friday                         --chronic hypotension on midodrine pre-HD 9: Essential hypertension: continue metoprolol 25 mg BID             -- Amlodipine discontinued     01/14/2022    5:50 AM 01/14/2022    5:00 AM 01/13/2022    7:29 PM  Vitals with BMI  Weight  211 lbs 10 oz   BMI  25.42   Systolic 706  237  Diastolic 67  79  Pulse 65  68    10: Peripheral arterial disease s/p intervention             --maintained on aspirin, Plavix and statin 11: DM-2 with hyperglycemia: Hyperglycemia resolved off all medications.   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 Vitamin D deficiency: start ergocalciferol 50,000U once per week for 7 weeks.  17. Depression: continue Cymbalta and Wellbutrin 6/25 Well-controlled. 18. Phantom limb pain: increase Cymbalta to 60mg  daily.  6/25 Continue 60 mg Cymbalta daily. Add Amitriptyline 10mg  HS    LOS: 8 days A FACE TO FACE EVALUATION WAS PERFORMED  Clide Deutscher Tiffaney Heimann 01/14/2022, 9:26 AM

## 2022-01-14 NOTE — Progress Notes (Signed)
Occupational Therapy Session Note  Patient Details  Name: Michael Riggs MRN: [id number] Date of Birth: [birth year]  Today's Date: 01/14/2022 OT Individual Time: 1005-1100 OT Individual Time Calculation (min): 55 min    Short Term Goals: Week 1:  OT Short Term Goal 1 (Week 1): STG = LTG 2/2 ELOS  Skilled Therapeutic Interventions/Progress Updates:  Skilled OT intervention completed with focus on BUE strength testing for determining PWC eligibility, BUE exercises and functional transfers. Pt received seated in PWC, un-rated pain in buttocks, with therapist assisting pt with repositioning to improve sacral comfort and using tilt feature to off-load intermittently.  To assess and advocate for pt's eligibility with PWC, therapist assessed the following: -Shoulder/scapular alignment- with elevation, internal rotation and protraction noted -proximal and distal strength of the BUE- with varying degrees noted however weaker distally especially with MMT in wrist/digits, gross grasping, and lateral/pincer pinching strength with min-mod contractures noted bilaterally at baseline that limits pt's functional use with self-propulsion of w/c -Maximum ADL independence level- with pt grossly at the min-mod A level for all self-care tasks  Pt simultaneously completed the following BUE strengthening exercises to promote BUE endurance needed for functional tasks: (With red theraband) 3x15 reps Horizontal abduction Self-anchored shoulder flexion each arm Self-anchored bicep flexion each arm Alternating chest presses Therapist anchored scapular retraction each arm (Multimodal cues needed for form and technique throughout)  Pt with continued discomfort with staying seated in PWC, with request to return to bed 2/2 pain and general fatigue. Required dependent positioning of PWC next to bed for safety and in prep of transfer, total A for board placement then slide board transfer > L with CGA. No posterior  LOB this session however constant cues provided to consciously avoid flopping back into supine provided. Pain resolved upon being supine. Pt was able to reposition self with supervision. Pt remained semi-supine, with bed alarm on and all needs in reach at end of session.   Therapy Documentation Precautions:  Precautions Precautions: Fall, Other (comment) Precaution Comments: multiple areas of tissue breakdown to B legs, elbows. Wound vac Required Braces or Orthoses: Other Brace Splint/Cast: limb protector when OOB Restrictions Weight Bearing Restrictions: Yes RLE Weight Bearing: Non weight bearing LLE Weight Bearing: Weight bearing as tolerated Other Position/Activity Restrictions: Has a L prosthetic, wears for brief periods/transfers only.    Therapy/Group: Individual Therapy  Blase Mess, MS, OTR/L  01/14/2022, 7:33 AM

## 2022-01-14 NOTE — Progress Notes (Signed)
   Sw met with patient provided team conference updates. Patient reports that his brother will pick him up this weekend for discharge and would like discharge instructions on Friday from [place]. Patient has reported due to the lost of his cell phone he has now lost his place for new housing. Patient will discharge back home with his brother, SIL and mother to assist. Sw informed patient that his cell phone situation is currently being looked into by patient experience. No additional questions or concerns.

## 2022-01-14 NOTE — Progress Notes (Incomplete)
Physical Therapy Session Note  Patient Details  Name: Michael Riggs MRN: [id number] Date of Birth: 1970/12/18  Today's Date: 01/14/2022 PT Individual Time:  -      Short Term Goals: Week 1:  PT Short Term Goal 1 (Week 1): STG=LTG due to LOS  Skilled Therapeutic Interventions/Progress Updates:     Patient in *** upon PT arrival. Patient alert and agreeable to PT session. Patient denied pain during session.  Therapeutic Activity: Bed Mobility: Patient performed supine to/from sit with ***. Provided verbal cues for ***. Transfers: Patient performed sit to/from stand x*** with ***. Provided verbal cues for***.  Gait Training:  Patient ambulated *** feet using *** with ***. Ambulated with ***. Provided verbal cues for ***.  Wheelchair Mobility:  Patient propelled wheelchair *** feet with ***. Provided verbal cues for ***  Neuromuscular Re-ed: Patient performed the following *** activities: ***  Therapeutic Exercise: Patient performed the following exercises with verbal and tactile cues for proper technique. ***  Patient in *** at end of session with breaks locked, *** alarm set, and all needs within reach.   Therapy Documentation Precautions:  Precautions Precautions: Fall, Other (comment) Precaution Comments: multiple areas of tissue breakdown to B legs, elbows. Wound vac Required Braces or Orthoses: Other Brace Splint/Cast: limb protector when OOB Restrictions Weight Bearing Restrictions: Yes RLE Weight Bearing: Non weight bearing LLE Weight Bearing: Weight bearing as tolerated Other Position/Activity Restrictions: Has a L prosthetic, wears for brief periods/transfers only. General:   Vital Signs: Therapy Vitals Temp: 98 F (36.7 C) Pulse Rate: 65 Resp: 18 BP: 117/67 Patient Position (if appropriate): Lying Oxygen Therapy SpO2: 100 % Pain:   Mobility:   Locomotion :    Trunk/Postural Assessment :    Balance:   Exercises:   Other Treatments:       Therapy/Group: Individual Therapy  Zalea Pete L Kadarius Cuffe PT, DPT  01/14/2022, 6:15 AM

## 2022-01-14 NOTE — Progress Notes (Signed)
Per rehab CSW, pt will d/c to home with family on Saturday, 7/1. Contacted [facility] to make clinic aware pt should resume care on Monday.   Melven Sartorius Renal Navigator [phone]

## 2022-01-14 NOTE — Progress Notes (Signed)
Physical Therapy Session Note  Patient Details  Name: Michael Riggs MRN: [id number] Date of Birth: Dec 16, 1970  Today's Date: 01/14/2022 PT Individual Time: 5573-2202 PT Individual Time Calculation (min): 72 min   Short Term Goals: Week 1:  PT Short Term Goal 1 (Week 1): STG=LTG due to LOS  Skilled Therapeutic Interventions/Progress Updates:    Patient seated EOB working on breakfast upon PT entry.  Reports pain in bilateral LE's and RN informed pt needing medication. RN delivered medication and patient donned shirt with S.  Patient transferred bed to power w/c with side board and min A for balance and scooting and board removal.  Positioned in w/c with S.  Patient propelled in w/c to dayroom.  Transferred to mat A-P with supervision and increased time, cues for balance.  Seated EOM pt performed UE therex.  Removed leg protector and to supine with S.  Performed therex as noted below rolling to sides and prone with S some assist for stabilization on sides and cues throughout to prevent pt falling asleep.  Supine to sit on flat mat no rails with heavy mod A.  Patient seated with min A to apply limb protector.  Transferred to power wheelchair min A with slide board.  Propelled to his room with S and cues as pt ran into housekeeping cart, stated due to "falling asleep".  Left in chair with seat belt alarm active and all needs in reach, chair reclined for safety.   Therapy Documentation Precautions:  Precautions Precautions: Fall, Other (comment) Precaution Comments: multiple areas of tissue breakdown to B legs, elbows. Wound vac Required Braces or Orthoses: Other Brace Splint/Cast: limb protector when OOB Restrictions Weight Bearing Restrictions: Yes RLE Weight Bearing: Non weight bearing LLE Weight Bearing: Weight bearing as tolerated Other Position/Activity Restrictions: Has a L prosthetic, wears for brief periods/transfers only.  Pain: Pain Assessment Faces Pain Scale: Hurts even  more Pain Type: Surgical pain;Chronic pain Pain Location: Leg Pain Orientation: Left;Right Pain Onset: Progressive Pain Intervention(s): Distraction;Cutaneous stimulation    Exercises: General Exercises - Upper Extremity Elbow Flexion: Strengthening;Both;Seated;20 reps;Theraband Theraband Level (Elbow Flexion): Level 2 (Red) General Exercises - Lower Extremity Quad Sets:  (5 sec hold) Amputee Exercises Quad Sets: Strengthening;Both;10 reps;Supine (5 sec hold) Gluteal Sets: Strengthening;Both;10 reps (prone) Towel Squeeze: Strengthening;Both;10 reps;Supine (5 sec hold) Hip Extension: Right;Sidelying;Prone;10 reps Hip ABduction/ADduction: Strengthening;Both;10 reps;Sidelying Other Exercises Other Exercises: bridging with legs over therapy ball w/ 5 sec hold x 10 Other Exercises: seated rows with red t-band x 20 Other Exercises: supine hamstring stretches 3 x 20 sec hold with towel behind knee and keeping knee extended bilateral   Therapy/Group: Individual Therapy  Reginia Naas Magda Kiel, PT 01/14/2022, 8:44 AM

## 2022-01-15 LAB — GLUCOSE, CAPILLARY
Glucose-Capillary: 72 mg/dL (ref 70–99)
Glucose-Capillary: 107 mg/dL — ABNORMAL HIGH (ref 70–99)

## 2022-01-15 MED ORDER — GABAPENTIN 100 MG PO CAPS
100.0000 mg | ORAL_CAPSULE | Freq: Two times a day (BID) | ORAL | Status: DC
  Administered 2022-01-15 – 2022-01-16 (×3): 100 mg via ORAL
  Filled 2022-01-15 (×3): qty 1

## 2022-01-15 MED ORDER — HYDROMORPHONE HCL 2 MG PO TABS
4.0000 mg | ORAL_TABLET | ORAL | Status: DC | PRN
  Administered 2022-01-15 – 2022-01-17 (×5): 4 mg via ORAL
  Filled 2022-01-15 (×5): qty 2

## 2022-01-15 MED ORDER — CHLORHEXIDINE GLUCONATE CLOTH 2 % EX PADS
6.0000 | MEDICATED_PAD | Freq: Every day | CUTANEOUS | Status: DC
  Administered 2022-01-16 – 2022-01-17 (×2): 6 via TOPICAL

## 2022-01-15 NOTE — Progress Notes (Signed)
Occupational Therapy Session Note  Patient Details  Name: Michael Riggs MRN: [id number] Date of Birth: 11-Jul-1971  Today's Date: 01/15/2022 OT Individual Time: 1020-1115 OT Individual Time Calculation (min): 55 min    Short Term Goals: Week 1:  OT Short Term Goal 1 (Week 1): STG = LTG 2/2 ELOS  Skilled Therapeutic Interventions/Progress Updates:  Skilled OT intervention completed with focus on ADL retraining, safety education, d/c planning. Pt received supine in bed, asleep, easily woken with no c/o pain throughout session. Completed bed mobility with use of bed features with supervision, then total A for board placement, then CGA slideboard transfer > L with improved postural control this session. Seated in [place], pt completed UB bathing/donning with set up A, LB bathing with supervision including buttocks once therapist provided education on using tilt feature of PWC to promote access to peri-areas. Min A needed for donning pants over hips with pt using the tilt feature again, however with poor safety awareness, with attempt to roll R<>L with therapist providing education on safety implications with this. Completed arm rest push up bilaterally with therapist donning pants over buttocks. Discussed bathing/dressing options for home with therapist ultimately recommending that pt bathe EOB with set up A vs shower for safety and maximizing independence as it would allow pt to posteriorly lean and roll side to side safer during LB clothing management. Discussed toileting recommendations for home including using new bariatric drop arm BSC for slide board transfers vs stand pivoting to standard [facility] that he has at home. Pt with decreased insight to deficits stating "I'll just put my prosthetic on and stand pivot," with therapist providing further education regarding his poor wound healing on his L residual limb and implications with donning a prosthetic with continued rubbing and further irritation, not  to mention increased pain with trying to donn it. Pt remained in [place] at end of session with therapist alerting NT to help change bed linens prior to pt's return to bed. Seat belt and alarm belt donned and all immediate needs met at end of session.   Therapy Documentation Precautions:  Precautions Precautions: Fall, Other (comment) Precaution Comments: multiple areas of tissue breakdown to B legs, elbows. Wound vac Required Braces or Orthoses: Other Brace Splint/Cast: limb protector when OOB Restrictions Weight Bearing Restrictions: Yes RLE Weight Bearing: Non weight bearing LLE Weight Bearing: Weight bearing as tolerated Other Position/Activity Restrictions: Has a L prosthetic, wears for brief periods/transfers only.    Therapy/Group: Individual Therapy  Blase Mess, MS, OTR/L  01/15/2022, 7:47 AM

## 2022-01-15 NOTE — Discharge Instructions (Addendum)
Inpatient Rehab Discharge Instructions  Michael Riggs Discharge date and time: 01/17/222 Activities/Precautions/ Functional Status: Activity: no lifting, driving, or strenuous exercise until cleared by MD Diet: renal diet Wound Care: keep wound clean and dry Functional status:  ___ No restrictions     ___ Walk up steps independently ___ 24/7 supervision/assistance   ___ Walk up steps with assistance _x__ Intermittent supervision/assistance  ___ Bathe/dress independently ___ Walk with walker     ___ Bathe/dress with assistance ___ Walk Independently    ___ Shower independently ___ Walk with assistance    _x__ Shower with assistance _x__ No alcohol     ___ Return to work/school ________  COMMUNITY REFERRALS UPON DISCHARGE:    Home Health:   PT     OT     ST    RN                     Agency: [facility]  Phone: [phone]    Medical Equipment/Items Ordered: Bari Drop Arm Commode                                                 Agency/Supplier: [employer] [phone]   Special Instructions:  No driving, alcohol consumption or tobacco use.   My questions have been answered and I understand these instructions. I will adhere to these goals and the provided educational materials after my discharge from the hospital.  Patient/Caregiver Signature _______________________________ Date __________  Clinician Signature _______________________________________ Date __________  Please bring this form and your medication list with you to all your follow-up doctor's appointments.

## 2022-01-15 NOTE — Progress Notes (Signed)
[facility] Progress Note   Subjective:   Patient seen in room. No c/o's, doing well.   Objective Vitals:   01/14/22 1705 01/14/22 1723 01/14/22 1930 01/15/22 0405  BP: (!) 139/103 138/86 138/89 140/82  Pulse: 73 63 74 76  Resp: 18 17 18 18   Temp:  98.1 F (36.7 C) 97.9 F (36.6 C) 97.7 F (36.5 C)  TempSrc:  Oral Oral Oral  SpO2: 100% 99% 100% 100%  Weight:  94.1 kg  93.7 kg  Height:       Physical Exam General:chronically ill appearing male in NAD Heart:RRR, no mrg Lungs:CTAB, nml WOB on RA Abdomen:soft, NTND Extremities:b/l BKA, no edema, both stumps are wrappped Access: LU AVF +b/t  OP HD: [facility] MWF 5h  400/500   98.4kg  2/2 bath  AVF  15ga   Hep none - calcitriol 1 ug po tiw - no esa    Assessment/Plan: Chronic RLE wound/osteomyelitis: Failed limb salvage attempts, 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 Per ortho. ABX have been discontinued.  ESRD: on HD MWF. HD tomorrow.  BP/volume: Amlodipine d/c'd. BP's very good now,  remains on metop BID low dose. Using midodrine pre-HD. SP 7 HD sessions here, down 10kg from admit and at prior dry wt. EDW will need to be adjusted about 2-3 lower (~ 96kg) typically post BKA.  Anemia of ESRD: last Hgb 11.1. no ESA indicated    Secondary Hyperparathyroidism: CorrCa on high side.  Ergocalciferol, Tums and calcitriol discontinued for hypercalcemia.  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 Getting IV  sodium thiosulfate 25 gram with HD TIW. Stopped VDRA, ergo and tums. Lowest Ca bath here is 2.5, but will need 2.0 calcium bath when outpatient.  Typically surgical treatment of calciphylaxis wounds is not indicated (can make it worse) unless there is gross infection present. Treatment as above w/ sodium thiosulfate, limit Ca/ vit D, etc.   T2DM- per primary.  Debility-Now in [facility]   Disposition - per primary team  Kelly Splinter, MD 01/15/2022, 11:34 AM   Recent Labs  Lab 01/12/22 0628 01/14/22 1154  HGB  --  11.4*  ALBUMIN 2.9* 2.8*  CALCIUM 10.0 9.4  PHOS 5.8* 4.4  CREATININE 8.35* 7.43*  K 5.2* 4.8    Inpatient medications:  amitriptyline  10 mg Oral QHS   vitamin C  1,000 mg Oral Daily   aspirin EC  81 mg Oral Daily   atorvastatin  40 mg Oral QHS   bromocriptine  2.5 mg Oral QHS   buPROPion  300 mg Oral q AM   Chlorhexidine Gluconate Cloth  6 each Topical Q0600   clopidogrel  75 mg Oral Daily   DULoxetine  60 mg Oral Daily   enoxaparin (LOVENOX) injection  30 mg Subcutaneous Q24H   ferric citrate  630 mg Oral TID WC   gabapentin  100 mg Oral BID   melatonin  10 mg Oral QHS   metoprolol tartrate  12.5 mg Oral BID   midodrine  10 mg Oral Q M,W,F   multivitamin  1 tablet Oral QHS   nutrition supplement (JUVEN)  1 packet Oral BID BM   pantoprazole  40 mg Oral Daily   tamsulosin  0.4 mg Oral QHS   urea   Topical BID   zinc sulfate  220 mg Oral Daily  sodium thiosulfate 25 g in sodium chloride 0.9 % 200 mL Infusion for Calciphylaxis 25 g (01/14/22 1300)   acetaminophen, albuterol, bisacodyl, diphenhydrAMINE, guaiFENesin-dextromethorphan, HYDROmorphone, hydrOXYzine, ipratropium-albuterol, milk and molasses, phenol, polyethylene glycol, prochlorperazine **OR** prochlorperazine **OR** prochlorperazine, traZODone

## 2022-01-15 NOTE — Progress Notes (Signed)
PROGRESS NOTE   Subjective/Complaints: Complains of phantom limb pain in LLE and ligament tightness in new right BKA. Discussed starting Gabapentin 100mg  BID and he is agreeable. Also discussed increasing Dilaudid back to q4prn  ROS: + residual limb pain in right lower extremity, plus phantom limb pain in left lower extremity, denies chest pain, shortness of breath, constipation, +fatigue, +somnolence as per therapy   Objective:   No results found. Recent Labs    01/14/22 1154  WBC 11.5*  HGB 11.4*  HCT 35.4*  PLT 178    Recent Labs    01/14/22 1154  NA 130*  K 4.8  CL 87*  CO2 23  GLUCOSE 118*  BUN 53*  CREATININE 7.43*  CALCIUM 9.4    Intake/Output Summary (Last 24 hours) at 01/15/2022 1049 Last data filed at 01/15/2022 0707 Gross per 24 hour  Intake 480 ml  Output 2500 ml  Net -2020 ml        Physical Exam: Vital Signs Blood pressure 140/82, pulse 76, temperature 97.7 F (36.5 C), temperature source Oral, resp. rate 18, height 5\' 4"  (1.626 m), weight 93.7 kg, SpO2 100 %.   General: Alert and oriented x 3, No apparent distress, BMI 36.33, fatigued, somnolent at times, at other times very alert HEENT: Head is normocephalic, atraumatic, PERRLA, EOMI, sclera anicteric, oral mucosa pink and moist, dentition intact, ext ear canals clear,  Neck: Supple without JVD or lymphadenopathy Heart: Bradycardic. No murmurs rubs or gallops Chest: CTA bilaterally without wheezes, rales, or rhonchi; no distress Abdomen: Soft, non-tender, slightly protuberant versus distended, + normal active bowel sounds. Extremities: No clubbing, cyanosis, or edema. Pulses are 2+ Psych: Pt's affect is flat. 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  IV in RUE looks okay lesion on right index  finger   Neuro: Alert to person place and time.  Decreased light touch from knees down bilaterally. Musculoskeletal: Cervical back: Supple non-tender. Comments: UE strength 5 - forward 5 in biceps, triceps, WB grip and after a bilateral.  LE strength 5 -/5 on L EE and HF/KE/KF: And at least antigravity on RLE-due to pain cannot test against resistance.  Left BKA with a lot of scar scabs pleasant black eschar on side of left BKA. Wound vac removed yesterday.   Assessment/Plan: 1. Functional deficits which require 3+ hours per day of interdisciplinary therapy in a comprehensive inpatient rehab setting. Physiatrist is providing close team supervision and 24 hour management of active medical problems listed below. Physiatrist and rehab team continue to assess barriers to discharge/monitor patient progress toward functional and medical goals  Care Tool:  Bathing    Body parts bathed by patient: Right arm, Left arm, Chest, Abdomen, Right upper leg, Left upper leg, Face   Body parts bathed by helper: Front perineal area, Buttocks Body parts n/a: Right lower leg, Left lower leg (bilateral BKA)   Bathing assist Assist Level: Minimal Assistance - Patient > 75%     Upper Body Dressing/Undressing Upper body dressing   What is the patient wearing?: Pull over  shirt    Upper body assist Assist Level: Set up assist    Lower Body Dressing/Undressing Lower body dressing      What is the patient wearing?: Orthosis (limb guard)     Lower body assist Assist for lower body dressing: Minimal Assistance - Patient > 75%     Toileting Toileting Toileting Activity did not occur Landscape architect and hygiene only): Refused  Toileting assist       Transfers Chair/bed transfer  Transfers assist     Chair/bed transfer assist level: Minimal Assistance - Patient > 75% (slideboard)     Locomotion Ambulation   Ambulation assist   Ambulation activity did not occur: Safety/medical  concerns (unable to use L prosthetic due to pain, weakness, RLE NWB)          Walk 10 feet activity   Assist  Walk 10 feet activity did not occur: Safety/medical concerns (unable to use L prosthetic due to pain, weakness, RLE NWB)        Walk 50 feet activity   Assist Walk 50 feet with 2 turns activity did not occur: Safety/medical concerns (unable to use L prosthetic due to pain, weakness, RLE NWB)         Walk 150 feet activity   Assist Walk 150 feet activity did not occur: Safety/medical concerns (unable to use L prosthetic due to pain, weakness, RLE NWB)         Walk 10 feet on uneven surface  activity   Assist Walk 10 feet on uneven surfaces activity did not occur: Safety/medical concerns (unable to use L prosthetic due to pain, weakness, RLE NWB)         Wheelchair     Assist Is the patient using a wheelchair?: Yes Type of Wheelchair: Power Wheelchair activity did not occur: Safety/medical concerns (pain, weakness)  Wheelchair assist level: Supervision/Verbal cueing Max wheelchair distance: >372ft    Wheelchair 50 feet with 2 turns activity    Assist    Wheelchair 50 feet with 2 turns activity did not occur: Safety/medical concerns (pain, weakness)   Assist Level: Supervision/Verbal cueing   Wheelchair 150 feet activity     Assist  Wheelchair 150 feet activity did not occur: Safety/medical concerns (pain, weakness)   Assist Level: Supervision/Verbal cueing   Blood pressure 140/82, pulse 76, temperature 97.7 F (36.5 C), temperature source Oral, resp. rate 18, height 5\' 4"  (1.626 m), weight 93.7 kg, SpO2 100 %.  Medical Problem List and Plan: 1. 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             -patient may shower             -ELOS/Goals: 7-10 days w/c level supervision to mod I  Continue CIR   2.  Impaired mobility: continue Heparin             -antiplatelet  therapy: Plavix and aspirin 3. Residual limb pain: increase Dilaudid to 4mg  q4H prn.              --will continue oxycodone 15 mg q 4 hours and consider methadone 2.5 mg in place of IV Dilaudid             -d/c methadone since will be unable to follow-up with methadone clinic outpatient. 4. Insomnia:             -continue melatonin q HS 5. Neuropsych/cognition: This patient is capable of making decisions on  his own behalf. 6. Calciphylaxis skin lesions, discussed with nephrology and no derm/surgical consult recommended. Will consult wound care for recommendations as well.  7. Fluids/Electrolytes/Nutrition: Strict Is and Os             --HD/per nephrology 8: ESRD: HD on Monday/Wednesday/Friday                         --chronic hypotension on midodrine pre-HD 9: Essential hypertension: continue metoprolol 25 mg BID             -- Amlodipine discontinued     01/15/2022    4:05 AM 01/14/2022    7:30 PM 01/14/2022    5:23 PM  Vitals with BMI  Weight 206 lbs 9 oz  207 lbs 7 oz  BMI 59.74  16.38  Systolic [phone]  Diastolic 82 89 86  Pulse 76 74 63    10: Peripheral arterial disease s/p intervention             --maintained on aspirin, Plavix and statin 11: DM-2 with hyperglycemia: Hyperglycemia resolved off all medications.   CBG (last 3)  Recent Labs    01/14/22 0551 01/14/22 1149 01/15/22 0559  GLUCAP 103* 108* 72    12: Calciphylaxis: management per nephrology             --monitor left lower extremity and right 2nd finger lesions 13: OZY:YQMGNOIB Flomax 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 Vitamin D deficiency: start ergocalciferol 50,000U once per week for 7 weeks.  17. Depression: continue Cymbalta and Wellbutrin 6/25 Well-controlled. 18. Phantom limb pain: increase Cymbalta to 60mg  daily.  6/25 Continue 60 mg Cymbalta daily. Add Amitriptyline 10mg  HS    LOS: 9 days A FACE TO FACE EVALUATION WAS  PERFORMED  Clide Deutscher Ruba Outen 01/15/2022, 10:49 AM

## 2022-01-15 NOTE — Progress Notes (Signed)
Physical Therapy Session Note  Patient Details  Name: Michael Riggs MRN: [id number] Date of Birth: Jul 20, 1971  Today's Date: 01/15/2022 PT Individual Time: 0800-0910 and 1401-1456 PT Individual Time Calculation (min): 70 min and 55 min  Short Term Goals: Week 1:  PT Short Term Goal 1 (Week 1): STG=LTG due to LOS  Skilled Therapeutic Interventions/Progress Updates:   Treatment Session 1 Received pt semi-reclined in bed, pt agreeable to PT treatment, and reported pain 8/10 in residual limbs - RN present to administer pain medication. Session with emphasis on functional mobility/transfers, dressing, generalized strengthening and endurance, simulated car transfers, and D/C planning. Discussed equipment for D/C and pt reports having manual WC and slideboard at home. Pt also reports having an adjustable bed at home but reports it is really high and is hopeful to get a power chair with seat elevator to make transfers easier - still waiting from [employer] to determine eligibility. Nephrology MD present for brief assessment and pt rolled L/R with supervision and use of bedrails and required max A to pull pants over hips. Pt transferred semi-reclined<>sitting EOB with HOB slightly elevated and use of bedrails with mod A due to poor trunk strength. Pt transferred bed<>power WC via slideboard to L with min A and performed power WC mobility >356ft to ortho gym with supervision - 1 instance running into wall on R side. Of note, pt requires cues for power WC safety and would benefit from further education on various features as well as importance of turning chair off prior to transferring. Pt performed simulated car transfer with CGA using slideboard and discussed methods for transporting power WC if he receives one. Pt then performed BUE strengthening on UBE at level 3 alternating 1 minute forwards and 1 minute backwards for a total of 6 minutes with cues to pay attention to intervals. Pt performed power WC  mobility additional 352ft with supervision to dayroom and performed bicep curls with 5lb dowel 2x15 and overhead shoulder press with 5lb dowel 2x12. Pt performed power WC mobility 171ft with supervision back to room and requested to return to bed due to fatigue. Pt transferred power WC<>bed via slideboard with CGA, then demonstrated posterior LOB backwards into supine. Concluded session with pt semi-reclined in bed, needs within reach, and bed alarm on.   Treatment Session 2 Received pt semi-reclined in bed, pt agreeable to PT treatment with encouragement, and reported pain 9/10 in L residual limb. RN notified and present to administer pain medication. Session with emphasis on functional mobility/transfers, generalized strengthening and endurance, and community navigation/power WC safety. Pt transferred semi-reclined<>sitting EOB with HOB elevated and use of bedrails with CGA and increased time with encouragement for pt to be as independent at possible. Donned R limb guard with max A and pt transferred bed<>power WC via slideboard with min A +2 for safety to L - cues for head/hips relationship but pt with poor carry over with education. Pt performed power WC mobility outside to entrance of [facility] with supervision; however pt does require cues for attention and safety awareness as pt running into obstacles on L/R. Worked on Research scientist (life sciences) in/out of tight spaces to simulate home environment - cues for adjustment to appropriate speeds specifically when turning and propelling in reverse. Returned to room and pt requested to return to bed. Transferred power<>WC bed via slideboard with min A and sit<>supine CGA. Concluded session with pt supine in bed, needs within reach, and bed alarm on.   Therapy Documentation Precautions:  Precautions  Precautions: Fall, Other (comment) Precaution Comments: multiple areas of tissue breakdown to B legs, elbows. Wound vac Required Braces or Orthoses: Other  Brace Splint/Cast: limb protector when OOB Restrictions Weight Bearing Restrictions: Yes RLE Weight Bearing: Non weight bearing LLE Weight Bearing: Weight bearing as tolerated Other Position/Activity Restrictions: Has a L prosthetic, wears for brief periods/transfers only.  Therapy/Group: Individual Therapy Alfonse Alpers PT, DPT  01/15/2022, 6:53 AM

## 2022-01-15 NOTE — Progress Notes (Signed)
[name], male   DOB: 1971-01-30, 51 y.o.   MRN: [id number]  Patient provided with the contact information for [employer] to proceed with order for [facility]. Patient aware he will need to call and make the co payment before the order can be processed. No additional questions or concerns.

## 2022-01-16 LAB — CBC
HCT: 34.6 % — ABNORMAL LOW (ref 39.0–52.0)
Hemoglobin: 10.8 g/dL — ABNORMAL LOW (ref 13.0–17.0)
MCH: 33.4 pg (ref 26.0–34.0)
MCHC: 31.2 g/dL (ref 30.0–36.0)
MCV: 107.1 fL — ABNORMAL HIGH (ref 80.0–100.0)
Platelets: 181 10*3/uL (ref 150–400)
RBC: 3.23 MIL/uL — ABNORMAL LOW (ref 4.22–5.81)
RDW: 14.7 % (ref 11.5–15.5)
WBC: 10.5 10*3/uL (ref 4.0–10.5)
nRBC: 0 % (ref 0.0–0.2)

## 2022-01-16 LAB — GLUCOSE, CAPILLARY: Glucose-Capillary: 109 mg/dL — ABNORMAL HIGH (ref 70–99)

## 2022-01-16 LAB — RENAL FUNCTION PANEL
Albumin: 2.8 g/dL — ABNORMAL LOW (ref 3.5–5.0)
Anion gap: 14 (ref 5–15)
BUN: 48 mg/dL — ABNORMAL HIGH (ref 6–20)
CO2: 26 mmol/L (ref 22–32)
Calcium: 9 mg/dL (ref 8.9–10.3)
Chloride: 93 mmol/L — ABNORMAL LOW (ref 98–111)
Creatinine, Ser: 6.98 mg/dL — ABNORMAL HIGH (ref 0.61–1.24)
GFR, Estimated: 9 mL/min — ABNORMAL LOW (ref >60)
Glucose, Bld: 127 mg/dL — ABNORMAL HIGH (ref 70–99)
Phosphorus: 3.9 mg/dL (ref 2.5–4.6)
Potassium: 4.4 mmol/L (ref 3.5–5.1)
Sodium: 133 mmol/L — ABNORMAL LOW (ref 135–145)

## 2022-01-16 MED ORDER — DULOXETINE HCL 60 MG PO CPEP: 60.0000 mg | ORAL_CAPSULE | Freq: Every day | ORAL | 0 refills | Status: AC

## 2022-01-16 MED ORDER — ACETAMINOPHEN 325 MG PO TABS: 325.0000 mg | ORAL_TABLET | ORAL | Status: AC | PRN

## 2022-01-16 MED ORDER — AMITRIPTYLINE HCL 10 MG PO TABS: 10.0000 mg | ORAL_TABLET | Freq: Every day | ORAL | 0 refills | Status: AC

## 2022-01-16 MED ORDER — GABAPENTIN 100 MG PO CAPS
100.0000 mg | ORAL_CAPSULE | Freq: Three times a day (TID) | ORAL | Status: DC
  Administered 2022-01-16: 100 mg via ORAL
  Filled 2022-01-16 (×2): qty 1

## 2022-01-16 MED ORDER — ASPIRIN 81 MG PO TBEC: 81.0000 mg | DELAYED_RELEASE_TABLET | Freq: Every day | ORAL | 0 refills | Status: AC

## 2022-01-16 MED ORDER — BROMOCRIPTINE MESYLATE 2.5 MG PO TABS: 2.5000 mg | ORAL_TABLET | Freq: Every day | ORAL | 1 refills | Status: AC

## 2022-01-16 MED ORDER — METOPROLOL TARTRATE 25 MG PO TABS: 12.5000 mg | ORAL_TABLET | Freq: Two times a day (BID) | ORAL | 0 refills | Status: AC

## 2022-01-16 MED ORDER — TAMSULOSIN HCL 0.4 MG PO CAPS: 0.4000 mg | ORAL_CAPSULE | Freq: Every day | ORAL | 0 refills | Status: AC

## 2022-01-16 MED ORDER — MIDODRINE HCL 10 MG PO TABS: 10.0000 mg | ORAL_TABLET | ORAL | 0 refills | Status: AC

## 2022-01-16 MED ORDER — MELATONIN 10 MG PO TABS: 10.0000 mg | ORAL_TABLET | Freq: Every day | ORAL | 0 refills | Status: AC

## 2022-01-16 MED ORDER — BUPROPION HCL ER (XL) 300 MG PO TB24: 300.0000 mg | ORAL_TABLET | Freq: Every morning | ORAL | 0 refills | Status: AC

## 2022-01-16 MED ORDER — CLOPIDOGREL BISULFATE 75 MG PO TABS: 75.0000 mg | ORAL_TABLET | Freq: Every day | ORAL | 0 refills | Status: AC

## 2022-01-16 MED ORDER — PANTOPRAZOLE SODIUM 40 MG PO TBEC: 40.0000 mg | DELAYED_RELEASE_TABLET | Freq: Every day | ORAL | 0 refills | Status: AC

## 2022-01-16 MED ORDER — GABAPENTIN 100 MG PO CAPS: 100.0000 mg | ORAL_CAPSULE | Freq: Three times a day (TID) | ORAL | 0 refills | Status: AC

## 2022-01-16 MED ORDER — ASCORBIC ACID 1000 MG PO TABS: 1000.0000 mg | ORAL_TABLET | Freq: Every day | ORAL | Status: AC

## 2022-01-16 MED ORDER — RENA-VITE PO TABS: 1.0000 | ORAL_TABLET | Freq: Every day | ORAL | 0 refills | Status: AC

## 2022-01-16 MED ORDER — ATORVASTATIN CALCIUM 40 MG PO TABS: 40.0000 mg | ORAL_TABLET | Freq: Every day | ORAL | 0 refills | Status: AC

## 2022-01-16 MED ORDER — HYDROMORPHONE HCL 4 MG PO TABS: 4.0000 mg | ORAL_TABLET | ORAL | 0 refills | Status: AC | PRN

## 2022-01-16 MED ORDER — ZINC SULFATE 220 (50 ZN) MG PO CAPS
220.0000 mg | ORAL_CAPSULE | Freq: Every day | ORAL | Status: AC
Start: 2022-01-16 — End: ?

## 2022-01-16 MED ORDER — FERRIC CITRATE 1 GM 210 MG(FE) PO TABS: 630.0000 mg | ORAL_TABLET | Freq: Three times a day (TID) | ORAL | 0 refills | Status: AC

## 2022-01-16 NOTE — Progress Notes (Signed)
PROGRESS NOTE   Subjective/Complaints: Continues to have neuropathic pain and would like to have Gabapentin further increased to TID Would like to have longer time here but discussed that he has met his goals  ROS: + residual limb pain in right lower extremity, plus phantom limb pain in left lower extremity, denies chest pain, shortness of breath, constipation, +fatigue, +somnolence as per therapy, at other times very alert   Objective:   No results found. Recent Labs    01/14/22 1154  WBC 11.5*  HGB 11.4*  HCT 35.4*  PLT 178    Recent Labs    01/14/22 1154  NA 130*  K 4.8  CL 87*  CO2 23  GLUCOSE 118*  BUN 53*  CREATININE 7.43*  CALCIUM 9.4    Intake/Output Summary (Last 24 hours) at 01/16/2022 1030 Last data filed at 01/16/2022 0944 Gross per 24 hour  Intake 991 ml  Output --  Net 991 ml        Physical Exam: Vital Signs Blood pressure 113/82, pulse 76, temperature 98.1 F (36.7 C), resp. rate 14, height 5' 4" (1.626 m), weight 93.7 kg, SpO2 97 %.   General: Alert and oriented x 3, No apparent distress, BMI 35.46, fatigued, somnolent at times, at other times very alert HEENT: Head is normocephalic, atraumatic, PERRLA, EOMI, sclera anicteric, oral mucosa pink and moist, dentition intact, ext ear canals clear,  Neck: Supple without JVD or lymphadenopathy Heart: Bradycardic. No murmurs rubs or gallops Chest: CTA bilaterally without wheezes, rales, or rhonchi; no distress, breathing comfortably on room air Abdomen: Soft, non-tender, slightly protuberant versus distended, + normal active bowel sounds. Extremities: No clubbing, cyanosis, or edema. Pulses are 2+ Psych: Pt's affect is flat. 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  IV in RUE looks okay lesion on right  index finger   Neuro: Alert to person place and time.  Decreased light touch from knees down bilaterally. Musculoskeletal: Cervical back: Supple non-tender. Comments: UE strength 5 - forward 5 in biceps, triceps, WB grip and after a bilateral.  LE strength 5 -/5 on L EE and HF/KE/KF: And at least antigravity on RLE-due to pain cannot test against resistance.  Left BKA with a lot of scar scabs pleasant black eschar on side of left BKA. Wound vac removed yesterday.   Assessment/Plan: 1. Functional deficits which require 3+ hours per day of interdisciplinary therapy in a comprehensive inpatient rehab setting. Physiatrist is providing close team supervision and 24 hour management of active medical problems listed below. Physiatrist and rehab team continue to assess barriers to discharge/monitor patient progress toward functional and medical goals  Care Tool:  Bathing    Body parts bathed by patient: Right arm, Left arm, Chest, Abdomen, Right upper leg, Left upper leg, Face, Front perineal area, Buttocks   Body parts bathed by helper: Front perineal area, Buttocks Body parts n/a: Right lower leg, Left lower leg   Bathing assist Assist Level: Supervision/Verbal cueing     Upper Body Dressing/Undressing Upper body dressing   What is the  patient wearing?: Pull over shirt    Upper body assist Assist Level: Independent with assistive device    Lower Body Dressing/Undressing Lower body dressing      What is the patient wearing?: Underwear/pull up, Pants     Lower body assist Assist for lower body dressing: Minimal Assistance - Patient > 75%     Toileting Toileting Toileting Activity did not occur Landscape architect and hygiene only): Refused  Toileting assist       Transfers Chair/bed transfer  Transfers assist     Chair/bed transfer assist level: Minimal Assistance - Patient > 75% (slideboard)     Locomotion Ambulation   Ambulation assist   Ambulation activity  did not occur: Safety/medical concerns (pain, weakness, decreased balance, unable to use L prosthetic due to poor wound healing)          Walk 10 feet activity   Assist  Walk 10 feet activity did not occur: Safety/medical concerns (pain, weakness, decreased balance, unable to use L prosthetic due to poor wound healing)        Walk 50 feet activity   Assist Walk 50 feet with 2 turns activity did not occur: Safety/medical concerns (pain, weakness, decreased balance, unable to use L prosthetic due to poor wound healing)         Walk 150 feet activity   Assist Walk 150 feet activity did not occur: Safety/medical concerns (pain, weakness, decreased balance, unable to use L prosthetic due to poor wound healing)         Walk 10 feet on uneven surface  activity   Assist Walk 10 feet on uneven surfaces activity did not occur: Safety/medical concerns (pain, weakness, decreased balance, unable to use L prosthetic due to poor wound healing)         Wheelchair     Assist Is the patient using a wheelchair?: Yes Type of Wheelchair: Power Wheelchair activity did not occur: Safety/medical concerns (pain, weakness)  Wheelchair assist level: Supervision/Verbal cueing Max wheelchair distance: >136f    Wheelchair 50 feet with 2 turns activity    Assist    Wheelchair 50 feet with 2 turns activity did not occur: Safety/medical concerns (pain, weakness)   Assist Level: Supervision/Verbal cueing   Wheelchair 150 feet activity     Assist  Wheelchair 150 feet activity did not occur: Safety/medical concerns (pain, weakness)   Assist Level: Supervision/Verbal cueing   Blood pressure 113/82, pulse 76, temperature 98.1 F (36.7 C), resp. rate 14, height 5' 4" (1.626 m), weight 93.7 kg, SpO2 97 %.  Medical Problem List and Plan: 1. 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             -patient may shower              -ELOS/Goals: 7-10 days w/c level supervision to mod I  Continue CIR   2.  Impaired mobility: continue Heparin             -antiplatelet therapy: Plavix and aspirin 3. Residual limb pain: increase Dilaudid to 43mq4H prn.              --will continue oxycodone 15 mg q 4 hours and consider methadone 2.5 mg in place of IV Dilaudid             -d/c methadone since will be unable to follow-up with methadone clinic outpatient. 4. Insomnia:             -  continue melatonin q HS 5. Neuropsych/cognition: This patient is capable of making decisions on his own behalf. 6. Calciphylaxis skin lesions, discussed with nephrology and no derm/surgical consult recommended. Will consult wound care for recommendations as well.  7. Fluids/Electrolytes/Nutrition: Strict Is and Os             --HD/per nephrology 8: ESRD: HD on Monday/Wednesday/Friday                         --chronic hypotension on midodrine pre-HD 9: Essential hypertension: continue metoprolol 25 mg BID             -- Amlodipine discontinued     01/16/2022    5:00 AM 01/15/2022    8:39 PM 01/15/2022    7:47 PM  Vitals with BMI  Systolic 113 131 117  Diastolic 82 83 38  Pulse 76 72 70    10: Peripheral arterial disease s/p intervention             --maintained on aspirin, Plavix and statin 11: DM-2 with hyperglycemia: Hyperglycemia resolved off all medications. 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 Vitamin D deficiency: start ergocalciferol 50,000U once per week for 7 weeks.  17. Depression: continue Cymbalta and Wellbutrin 6/25 Well-controlled. 18. Phantom limb pain: increase  Cymbalta to 60mg daily.  6/25 Continue 60 mg Cymbalta daily. Add Amitriptyline 10mg HS. Increase Gabapentin to 100mg TID    LOS: 10 days A FACE TO FACE EVALUATION WAS PERFORMED   P  01/16/2022, 10:30 AM     

## 2022-01-16 NOTE — Progress Notes (Signed)
Physical Therapy Session Note  Patient Details  Name: Michael Riggs MRN: [id number] Date of Birth: [birth year]  Today's Date: 01/16/2022 PT Individual Time: 1005-1100 PT Individual Time Calculation (min): 55 min   Short Term Goals: Week 1:  PT Short Term Goal 1 (Week 1): STG=LTG due to LOS   Skilled Therapeutic Interventions/Progress Updates:   Pt received sitting in WC and agreeable to PT. PT assisted pt to transfer to toilet with min assist and cues for improved postioning of BLE to prevent WB. Pt able to void bowel sitting on toilet. Mod assist for clothing management. Car transfer to and from [facility] with supervision assist into car and CGA to return to [facility]. PT required to provide cues for UE placement, and awareness for head.hips relationship to reduce fall risk. WC mobility through hal, atruim and sidewalk at [facility] in power WC. Cues for safe speed and awareness of edge of curb initially on sidewalk. Pt returned to room and performed slide board transfer to bed with set up by PT. Sit>supine completed without assist, and left supine in bed with call bell in reach and all needs met.        Therapy Documentation Precautions:  Precautions Precautions: Fall, Other (comment) Precaution Comments: multiple areas of tissue breakdown to B legs, elbows. Required Braces or Orthoses: Other Brace Splint/Cast: limb protector when OOB Restrictions Weight Bearing Restrictions: Yes RLE Weight Bearing: Non weight bearing LLE Weight Bearing: Weight bearing as tolerated Other Position/Activity Restrictions: Has a L prosthetic, but unable to use it due to wounds/ulcers and skin breakdown  Vital Signs: Therapy Vitals Temp: 98.4 F (36.9 C) Temp Source: Oral Pulse Rate: 75 Resp: 14 BP: 134/79 Patient Position (if appropriate): Lying Oxygen Therapy SpO2: 98 % O2 Device: Room Air Pain: Pain Assessment Pain Scale: 0-10 Pain Score: Asleep Pain Type: Acute pain Pain Intervention(s):  Medication (See eMAR)     Therapy/Group: Individual Therapy  Lorie Phenix 01/16/2022, 10:29 PM

## 2022-01-16 NOTE — Progress Notes (Addendum)
Inpatient Rehabilitation Discharge Medication Review by a Pharmacist  A complete drug regimen review was completed for this patient to identify any potential clinically significant medication issues.  High Risk Drug Classes Is patient taking? Indication by Medication  Antipsychotic No   Anticoagulant No   Antibiotic No   Opioid Yes Hydromorphone - pain  Antiplatelet Yes Clopidogrel, ASA - CVA  Hypoglycemics/insulin No   Vasoactive Medication Yes Metoprolol, midodrine - BP  Chemotherapy No   Other Yes Atorvastatin - HLD Gabapentin, Amitriptyline, duloxetine - pain Bromocriptine - pain Bupropion - mood Ferric citrate - renal / HD Pantoprazole - GERD Tamsulosin - BPH     Type of Medication Issue Identified Description of Issue Recommendation(s)  Drug Interaction(s) (clinically significant)     Duplicate Therapy     Allergy     No Medication Administration End Date     Incorrect Dose     Additional Drug Therapy Needed     Significant med changes from prior encounter (inform family/care partners about these prior to discharge).    Other       Clinically significant medication issues were identified that warrant physician communication and completion of prescribed/recommended actions by midnight of the next day:  No  Pharmacist comments: None  Time spent performing this drug regimen review (minutes):  20 minutes   Tad Moore 01/16/2022 12:37 PM

## 2022-01-16 NOTE — Progress Notes (Signed)
Verified patient's retail pharmacy and he stated he needed refills on all medications. Discharge completed and AVS printed. Patient in HD unit this afternoon and is quite somnolent. I went over his d/c instructions and meds to the best of my ability; however, he would quickly dose off. I left the AVS with [place], RN and asked that Lattie Haw, NP review with patient prior to D/C tomorrow.

## 2022-01-16 NOTE — Progress Notes (Signed)
Inpatient Rehabilitation Care Coordinator Discharge Note   Patient Details  Name: Michael Riggs MRN: [id number] Date of Birth: 1971-05-06   Discharge location: Home  Length of Stay: 11 Days  Discharge activity level: CGA/Min  Home/community participation: SIL, mother and brother  Patient response BW:GYKZLD Literacy - How often do you need to have someone help you when you read instructions, pamphlets, or other written material from your doctor or pharmacy?: Never  Patient response JT:TSVXBL Isolation - How often do you feel lonely or isolated from those around you?: Never  Services provided included: SW, Neuropsych, Pharmacy, TR, CM, RN, SLP, PT, RD, MD  Financial Services:  Financial Services Utilized: Medicare    Choices offered to/list presented to: patient  Follow-up services arranged:  [place]: [facility]         Patient response to transportation need: Is the patient able to respond to transportation needs?: Yes In the past 12 months, has lack of transportation kept you from medical appointments or from getting medications?: No In the past 12 months, has lack of transportation kept you from meetings, work, or from getting things needed for daily living?: No    Comments (or additional information):  Patient/Family verbalized understanding of follow-up arrangements:  Yes  Individual responsible for coordination of the follow-up plan: self or [name] [phone]  Confirmed correct DME delivered: Dyanne Iha 01/16/2022    Dyanne Iha

## 2022-01-16 NOTE — Progress Notes (Signed)
Physical Therapy Discharge Summary  Patient Details  Name: Michael Riggs MRN: [id number] Date of Birth: 1970/09/02  Today's Date: 01/16/2022 PT Individual Time: 0731-0826 PT Individual Time Calculation (min): 55 min   Patient has met 1 of 6 long term goals due to improved activity tolerance, improved balance, improved postural control, increased strength, increased range of motion, ability to compensate for deficits, improved awareness, and improved coordination. Patient to discharge at a wheelchair level [place]. Patient's care partner is independent to provide the necessary physical assistance at discharge. Pt's brother did not attend family education training, however pt reports his brother was assisting at baseline and verbalized confidence with his brother's ability to provide physical assist as needed.   Reasons goals not met: Pt did not meet goals due to generalized weakness/deconditioning, impaired wound healing impacting ability to wear prosthetic, and poor motivation to participate in therapy sessions.   Recommendation:  Patient will benefit from ongoing skilled PT services in home health setting to continue to advance safe functional mobility, address ongoing impairments in transfers, generalized strengthening and endurance, amputee education, and to minimize fall risk.  Equipment: Attempting to get power WC; already has manual WC, RW, SPC  Reasons for discharge: treatment goals met and discharge from hospital  Patient/family agrees with progress made and goals achieved: Yes  Today's Interventions: Received pt semi-reclined in bed asleep, upon wakening pt agreeable to PT treatment, and reported pain 6/10 in bilateral residual limbs - declined any pain interventions. Session with emphasis on discharge planning, bed mobility, and generalized strengthening and endurance. Pt reported feeling drowsy this morning and appeared slightly disoriented upon wakening, but improved  throughout session. Pt verbalized confidence with mobility upon D/C stating his brother will be able to assist as much as needed and understands he will transfer primarily bed<>chair using slideboard due to poor wound healing impacting ability to use L prosthetic. Focused on bed mobility and pt attempted to sit EOB with HOB elevated and use of bedrails but began sliding down to foot of bed; therefore encouraged pt to return to supine and scoot to [facility] - able to do so with supervision and cues for technique pulling on headboard/bedrails. Pt then transferred semi-reclined<>sitting EOB with HOB elevated to 41 degrees and use of bedrails with supervision and cues for logroll technique (pt reports his bed can raise even higher). Performed MMT sitting EOB then returned to supine with mod I and performed SLR 2x10 bilaterally and hip adduction pillow squeezes 2x10 - of note, pt falling in/out of sleep during exercises, requiring cues for attention/arousal. Pt then reported urge to toilet - transferred semi-reclined<>sitting EOB with HOB elevated and supervision in same manner listed above and transferred to/from bedside commode with slideboard and min A. Doffed pants via lateral leans and CGA and pt continent of bowel - NT made aware. Pt able to perform peri-care with CGA but required multiple attempts (via tricep push ups) and max A to pull underwear/pants over hips due to fatigue. Transferred sit<>supine with mod I and therapist performed additional peri-care to ensure cleanliness per pt request. Rolled L/R with mod I using bedrails to pull underwear/pants over hips. Concluded session with pt sitting upright in bed, needs within reach, and bed alarm on. Assisted pt with calling in breakfast order.   PT Discharge Precautions/Restrictions Precautions Precautions: Fall;Other (comment) Precaution Comments: multiple areas of tissue breakdown to B legs, elbows. Required Braces or Orthoses: Other Brace Splint/Cast: limb  protector when OOB Restrictions Weight Bearing Restrictions: Yes RLE  Weight Bearing: Non weight bearing LLE Weight Bearing: Weight bearing as tolerated Other Position/Activity Restrictions: Has a L prosthetic, but unable to use it due to wounds/ulcers and skin breakdown Pain Interference Pain Interference Pain Effect on Sleep: 2. Occasionally Pain Interference with Therapy Activities: 3. Frequently Pain Interference with Day-to-Day Activities: 1. Rarely or not at all Cognition Overall Cognitive Status: Within Functional Limits for tasks assessed Arousal/Alertness: Awake/alert Orientation Level: Oriented X4 Memory: Impaired Awareness: Appears intact Problem Solving: Impaired Safety/Judgment: Impaired Comments: slightly impulsive Sensation Sensation Light Touch: Impaired Detail Proprioception: Impaired by gross assessment Additional Comments: Absent sensation along bottom of both residual limbs. 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, decreased balance/postural control, pain, and generalized weakness/deconditioning Finger Nose Finger Test: dysmetria and slow bilaterally Heel Shin Test: unable to perform due to bilateral BKA but hip flexibility appears [facility] Motor  Motor Motor: Abnormal postural alignment and control;Hemiplegia Motor - Skilled Clinical Observations: 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, previous CVA, decreased balance/postural control, pain, and generalized weakness/deconditioning  Mobility Bed Mobility Bed Mobility: Rolling Right;Rolling Left;Sit to Supine;Supine to Sit Rolling Right: Independent with assistive device Rolling Left: Independent with assistive device Supine to Sit:  Supervision/Verbal cueing Sit to Supine: Independent with assistive device Transfers Transfers: Lateral/Scoot Transfers Lateral/Scoot Transfers: Minimal Assistance - Patient > 75% Transfer (Assistive device): Other (Comment) (slideboard) Locomotion  Gait Ambulation: No Gait Gait: No Stairs / Additional Locomotion Stairs: No Architect: Yes Wheelchair Assistance: Chartered loss adjuster: Power Wheelchair Parts Management: Supervision/cueing Distance: >145f  Trunk/Postural Assessment  Cervical Assessment Cervical Assessment: Within FScientist, physiologicalAssessment: Exceptions to [facility](thoracic rounding) Lumbar Assessment Lumbar Assessment: Exceptions to [facility](posterior pelvic tilt in sitting) Postural Control Postural Control: Deficits on evaluation Trunk Control: delayed but improved since eval  Balance Balance Balance Assessed: Yes Static Sitting Balance Static Sitting - Balance Support: Bilateral upper extremity supported;Feet unsupported Static Sitting - Level of Assistance: 6: Modified independent (Device/Increase time) Dynamic Sitting Balance Dynamic Sitting - Balance Support: No upper extremity supported;Feet unsupported Dynamic Sitting - Level of Assistance: 5: Stand by assistance (close supervision) Extremity Assessment  RLE Assessment RLE Assessment: Exceptions to [facility] Strength Comments: tested in sitting, limited by pain RLE Strength Right Hip Flexion: 4-/5 Right Hip ABduction: 4-/5 Right Hip ADduction: 4-/5 Right Knee Flexion: 4-/5 Right Knee Extension: 4-/5 LLE Assessment LLE Assessment: Exceptions to [facility] St[facility] Comments: tested in sitting, limited by pain LLE Strength Left Hip Flexion: 4-/5 Left Hip ABduction: 4-/5 Left Hip ADduction: 4-/5 Left Knee Flexion: 3+/5 Left Knee Extension: 3+/5  AAlfonse AlpersPT, DPT  01/16/2022, 7:24 AM

## 2022-01-16 NOTE — Progress Notes (Signed)
Contacted [facility] to confirm that plans remain for pt to d/c to home tomorrow and resume at clinic on Monday.   Melven Sartorius Renal Navigator [phone]

## 2022-01-16 NOTE — Progress Notes (Signed)
Occupational Therapy Session Note  Patient Details  Name: Michael Riggs MRN: [id number] Date of Birth: Jan 12, 1971  Today's Date: 01/16/2022 OT Individual Time: 0905-1000 OT Individual Time Calculation (min): 55 min    Short Term Goals: Week 1:  OT Short Term Goal 1 (Week 1): STG = LTG 2/2 ELOS  Skilled Therapeutic Interventions/Progress Updates:  Skilled OT intervention completed with focus on d/c planning, limb care, PWC and home management education. Pt received side-lying in bed attempting to get up, with pt's breakfast just delivered. With use of bed features and HOB elevated, pt sat EOB with CGA, then therapist assisted with opening containers 2/2 poor sensation in bilateral hands. Nurse in room to administer meds. Pt requesting eat some of his breakfast, with therapist assessing pt's recall of ADL self-care at home, with pt with poor recall/insight as despite education provided just yesterday about sponge bathing method, pt stating he will shower in his tub at home. Therapist re-educated pt on safety recommendations and maximizing independence with sink bathing/EOB dressing. Pt utilizing long handled mirror to self-inspect L residual limb with therapist wrapping it with non-adhesive bandage and ACE wrap to maintain skin integrity for transfers. Total A for board placement, then slideboard transfer > R with supervision transfer to [facility]. Elevated bilateral leg rests for passive knee extension stretch for contracture prevention, with pillow placed underneath for comfort. Pt with questions about the control buttons on the [place], with therapist reviewing the positional changes that can occur including the most important for him including the up/down, tilt and foot plate features, with discussion also regarding leaving his PWC in place next to bed when he gets in so that he has a way out when ready to get out the next time otherwise he will be more dependent on his brother. Re-viewed therapist's  recommendations for supervision/CGA for transfers to the chair, however overall min A for self-care tasks if in the PWC vs bed level. Pt remained in [place] at end of session, with seat belt and alarm belt donned and all needs in reach at end of session.   Therapy Documentation Precautions:  Precautions Precautions: Fall, Other (comment) Precaution Comments: multiple areas of tissue breakdown to B legs, elbows. Required Braces or Orthoses: Other Brace Splint/Cast: limb protector when OOB Restrictions Weight Bearing Restrictions: Yes RLE Weight Bearing: Non weight bearing LLE Weight Bearing: Weight bearing as tolerated Other Position/Activity Restrictions: Has a L prosthetic, but unable to use it due to wounds/ulcers and skin breakdown    Therapy/Group: Individual Therapy  Blase Mess, MS, OTR/L  01/16/2022, 7:31 AM

## 2022-01-16 NOTE — Progress Notes (Signed)
Occupational Therapy Discharge Summary  Patient Details  Name: Michael Riggs MRN: [id number] Date of Birth: 1971-03-16   Patient has met 2 of 6 long term goals due to improved activity tolerance, improved balance, and postural control.  Patient to discharge at [facility] Assist level for seated/bed level self-care only.  Patient's care partner is independent to provide the necessary physical and cognitive assistance at discharge, however has not been present for family training but was assisting pt at home at baseline with pt stating "they already know how to help me, they don't need training."    Reasons goals not met: Lower body dressing and toileting goals not met at mod I level secondary to poor safety awareness, requiring at the highest level, supervision with verbal cues needed for these tasks. Toilet transfer and tub/shower transfer not met at the supervision level, as pt with poor safety awareness, poor trunk control with frequent posterior LOB, and poor management of the slide board on plastic surfaces, requiring at a minimum, CGA for transfers to complete.  Recommendation:  Patient will benefit from ongoing skilled OT services in home health setting to continue to advance functional skills in the area of BADL, iADL, and Reduce care partner burden.  Equipment: Drop arm bariatric BSC  Reasons for discharge: treatment goals met and discharge from hospital  Patient/family agrees with progress made and goals achieved: Yes  OT Discharge Precautions/Restrictions  Precautions Precautions: Fall;Other (comment) Precaution Comments: multiple areas of tissue breakdown to B legs, elbows. Required Braces or Orthoses: Other Brace Splint/Cast: limb protector when OOB Restrictions Weight Bearing Restrictions: Yes RLE Weight Bearing: Non weight bearing LLE Weight Bearing: Weight bearing as tolerated Other Position/Activity Restrictions: Has a L prosthetic, but unable to use it due to  wounds/ulcers and skin breakdown ADL ADL Eating: Set up Where Assessed-Eating: Wheelchair Grooming: Modified independent Where Assessed-Grooming: Sitting at sink Upper Body Bathing: Modified independent Where Assessed-Upper Body Bathing: Sitting at sink Lower Body Bathing: Supervision/safety Where Assessed-Lower Body Bathing: Sitting at sink Upper Body Dressing: Independent Where Assessed-Upper Body Dressing: Sitting at sink Lower Body Dressing: Minimal assistance Where Assessed-Lower Body Dressing: Sitting at sink Toileting: Minimal assistance Where Assessed-Toileting: Bedside Commode Toilet Transfer: Therapist, music Method: Theatre manager: Extra wide drop arm bedside commode Tub/Shower Transfer: Metallurgist Method: Administrator, arts: Radio broadcast assistant, Energy manager: Unable to assess Social research officer, government Method: Unable to assess Vision Baseline Vision/History: 1 Wears glasses (PRN) Patient Visual Report: No change from baseline Vision Assessment?: No apparent visual deficits Additional Comments: pt reported intiially having double vision but has since resolved day after surgery Perception  Perception: Within Functional Limits Praxis Praxis: Intact Cognition Cognition Overall Cognitive Status: Within Functional Limits for tasks assessed Arousal/Alertness: Awake/alert Orientation Level: Person;Place;Situation Person: Oriented Place: Oriented Situation: Oriented Memory: Impaired Awareness: Appears intact Problem Solving: Impaired Safety/Judgment: Impaired Comments: slightly impulsive, decreased insight to deficits Brief Interview for Mental Status (BIMS) Repetition of Three Words (First Attempt): 3 Temporal Orientation: Year: Correct Temporal Orientation: Month: Accurate within 5 days Temporal Orientation: Day: Correct Recall: "Sock": Yes, no cue required Recall:  "Blue": Yes, no cue required Recall: "Bed": Yes, no cue required BIMS Summary Score: 15 Sensation Sensation Light Touch: Impaired Detail Light Touch Impaired Details: Impaired RUE;Impaired LUE Hot/Cold: Appears Intact Proprioception: Impaired by gross assessment Additional Comments: Reports numbness/tingling in bilateral hands at baseline. Absent sensation along bottom of both residual limbs. 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, previous CVA, decreased balance/postural control, pain, and generalized weakness/deconditioning Mobility  Bed Mobility Bed Mobility: Rolling Right;Rolling Left;Sit to Supine;Supine to Sit Rolling Right: Independent with assistive device Rolling Left: Independent with assistive device Supine to Sit: Supervision/Verbal cueing Sit to Supine: Independent with assistive device  Trunk/Postural Assessment  Cervical Assessment Cervical Assessment: Within Functional Limits Thoracic Assessment Thoracic Assessment: Exceptions to [facility] (thoracic rounding) Lumbar Assessment Lumbar Assessment: Exceptions to [facility] (posterior pelvic tilt in sitting) Postural Control Postural Control: Deficits on evaluation Trunk Control: delayed but improved since eval  Balance Balance Balance Assessed: Yes Static Sitting Balance Static Sitting -  Balance Support: Bilateral upper extremity supported;Feet unsupported Static Sitting - Level of Assistance: 6: Modified independent (Device/Increase time) Dynamic Sitting Balance Dynamic Sitting - Balance Support: No upper extremity supported;Feet unsupported Dynamic Sitting - Level of Assistance: 5: Stand by assistance (close supervision) Extremity/Trunk Assessment RUE Assessment RUE Assessment: Within Functional Limits General Strength Comments: grossly 4/5 proximally, 4-/5 distally with contractures in digits LUE Assessment LUE Assessment: Within Functional Limits General Strength Comments: grossly 4/5 proximally, 4-/5 distally with contractures in digits    E Kluttz, MS, OTR/L  01/16/2022, 7:49 AM

## 2022-01-16 NOTE — Progress Notes (Signed)
Received patient in bed, alert and oriented. Informed consent signed and in chart.  Time tx completed:3:44 of 4 hr tx  HD treatment completed. Patient tolerated well. Fistula/ without signs and symptoms of complications. Patient transported back to the room, alert and orient and in no acute distress. Report given to bedside RN.  Total UF removed:2300 mls removed  Medication given:none  Post HD VS:see chart  Post HD weight:    Pt completed prescribed tx, tolerated well.  Leaving unit stable, alert and oriented x4.

## 2022-01-16 NOTE — Progress Notes (Signed)
Occupational Therapy Session Note  Patient Details  Name: Michael Riggs MRN: [id number] Date of Birth: 13-Jan-1971  Today's Date: 01/16/2022 OT Individual Time: 1130-1200 OT Individual Time Calculation (min): 30 min    Short Term Goals: Week 1:  OT Short Term Goal 1 (Week 1): STG = LTG 2/2 ELOS  Skilled Therapeutic Interventions/Progress Updates:    Upon OT arrival, pt supine in bed and reports being very tired from previous therapy sessions. Pt agreeable to EOB therapy session and reports 7/10 pain in B LE. Pt completes supine to sit transfer with use of bed rails with SBA. Pt sits EOB with CGA and engages in B UE exercises for 1x10 reps including shoulder flex/ext, elbow flex/ext, chest press, shoulder abduction/adduction, wrist flex/ext, supination/pronation. Pt requires short seated rest breaks secondary to fatigue and decreased endurance. Pt also requires one supine rest break. Pt then sits EOB with CGA to complete oral care with CGA. Pt completes sit to supine transfer with SBA and was left in bed with all safety measures in place. Pt limited by decreased endurance and strength and continues to benefit from OT servicies to achieve highest level of independence.   Therapy Documentation Precautions:  Precautions Precautions: Fall, Other (comment) Precaution Comments: multiple areas of tissue breakdown to B legs, elbows. Required Braces or Orthoses: Other Brace Splint/Cast: limb protector when OOB Restrictions Weight Bearing Restrictions: Yes RLE Weight Bearing: Non weight bearing LLE Weight Bearing: Weight bearing as tolerated Other Position/Activity Restrictions: Has a L prosthetic, but unable to use it due to wounds/ulcers and skin breakdown  Therapy/Group: Individual Therapy  Marvetta Gibbons 01/16/2022, 12:21 PM

## 2022-01-16 NOTE — Progress Notes (Signed)
[facility] Progress Note   Subjective:   Patient seen in room. No c/o's, doing well.   Objective Vitals:   01/15/22 1327 01/15/22 1947 01/15/22 2039 01/16/22 0500  BP: 126/86 (!) 117/38 131/83 113/82  Pulse: 69 70 72 76  Resp: (!) 21 14  14   Temp: 98.1 F (36.7 C) 98 F (36.7 C)  98.1 F (36.7 C)  TempSrc: Oral     SpO2: 97% 96%  97%  Weight:      Height:       Physical Exam General:chronically ill appearing male in NAD Heart:RRR, no mrg Lungs:CTAB, nml WOB on RA Abdomen:soft, NTND Extremities:b/l BKA, no edema, both stumps are wrappped Access: LU AVF +b/t  OP HD: [facility] MWF 5h  400/500   98.4kg  2/2 bath  AVF  15ga   Hep none - calcitriol 1 ug po tiw - no esa    Assessment/Plan: Debility - on rehab sp R BKA (prior L BKA x yrs). Had chronic post foot wounds and failed limb salvage attempts.  SP R BKA - per ortho on 6/16.  ESRD: on HD MWF. HD today.  BP/volume: Amlodipine d/c'd. BP's very good now,  remains on metop BID low dose. Using midodrine pre-HD. 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 no ESA indicated    Secondary Hyperparathyroidism: CorrCa on high side.  Ergocalciferol, Tums and calcitriol discontinued for hypercalcemia.  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 Pt states he fell and scraped his L stump against the railing. Typically surgical treatment of calciphylaxis wounds is not indicated (can make it worse), unless there is gross infection present. Getting IV  sodium thiosulfate 25 gram with HD TIW. Stopped VDRA, ergo and tums. Lowest Ca bath here is 2.5, but will need 2.0 calcium bath when outpatient.  T2DM- per primary.  Disposition - per primary team  Kelly Splinter, MD 01/16/2022, 10:50 AM   Recent Labs  Lab 01/12/22 0628 01/14/22 1154  HGB  --  11.4*   ALBUMIN 2.9* 2.8*  CALCIUM 10.0 9.4  PHOS 5.8* 4.4  CREATININE 8.35* 7.43*  K 5.2* 4.8    Inpatient medications:  amitriptyline  10 mg Oral QHS   vitamin C  1,000 mg Oral Daily   aspirin EC  81 mg Oral Daily   atorvastatin  40 mg Oral QHS   bromocriptine  2.5 mg Oral QHS   buPROPion  300 mg Oral q AM   Chlorhexidine Gluconate Cloth  6 each Topical Q0600   clopidogrel  75 mg Oral Daily   DULoxetine  60 mg Oral Daily   enoxaparin (LOVENOX) injection  30 mg Subcutaneous Q24H   ferric citrate  630 mg Oral TID WC   gabapentin  100 mg Oral TID   melatonin  10 mg Oral QHS   metoprolol tartrate  12.5 mg Oral BID   midodrine  10 mg Oral Q M,W,F   multivitamin  1 tablet Oral QHS   nutrition supplement (JUVEN)  1 packet Oral BID BM   pantoprazole  40 mg Oral Daily   tamsulosin  0.4 mg Oral QHS   urea   Topical BID    sodium thiosulfate 25 g in sodium chloride 0.9 % 200 mL Infusion for Calciphylaxis 25 g (01/14/22 1300)   acetaminophen, albuterol, bisacodyl, diphenhydrAMINE,  guaiFENesin-dextromethorphan, HYDROmorphone, hydrOXYzine, ipratropium-albuterol, milk and molasses, phenol, polyethylene glycol, prochlorperazine **OR** prochlorperazine **OR** prochlorperazine, traZODone

## 2022-01-17 ENCOUNTER — Inpatient Hospital Stay (HOSPITAL_COMMUNITY): Payer: Medicare Other

## 2022-01-17 ENCOUNTER — Encounter (HOSPITAL_COMMUNITY): Payer: Self-pay | Admitting: Anesthesiology

## 2022-01-17 ENCOUNTER — Encounter (HOSPITAL_COMMUNITY): Payer: Self-pay | Admitting: Internal Medicine

## 2022-01-17 ENCOUNTER — Other Ambulatory Visit: Payer: Self-pay

## 2022-01-17 ENCOUNTER — Inpatient Hospital Stay (HOSPITAL_COMMUNITY)
Admit: 2022-01-17 | Discharge: 2022-02-17 | DRG: 377 | Disposition: E | Payer: Medicare Other | Source: Ambulatory Visit | Attending: Internal Medicine | Admitting: Internal Medicine

## 2022-01-17 ENCOUNTER — Encounter (HOSPITAL_COMMUNITY)
Admission: RE | Disposition: A | Discharge status: Other Acute Inpatient Hospital | Payer: Self-pay | Source: Intra-hospital | Attending: Physical Medicine and Rehabilitation

## 2022-01-17 DIAGNOSIS — Z833 Family history of diabetes mellitus: Secondary | ICD-10-CM

## 2022-01-17 DIAGNOSIS — D631 Anemia in chronic kidney disease: Secondary | ICD-10-CM | POA: Diagnosis present

## 2022-01-17 DIAGNOSIS — N186 End stage renal disease: Secondary | ICD-10-CM | POA: Diagnosis present

## 2022-01-17 DIAGNOSIS — J9602 Acute respiratory failure with hypercapnia: Secondary | ICD-10-CM | POA: Diagnosis not present

## 2022-01-17 DIAGNOSIS — Z8673 Personal history of transient ischemic attack (TIA), and cerebral infarction without residual deficits: Secondary | ICD-10-CM

## 2022-01-17 DIAGNOSIS — Z79899 Other long term (current) drug therapy: Secondary | ICD-10-CM

## 2022-01-17 DIAGNOSIS — I739 Peripheral vascular disease, unspecified: Secondary | ICD-10-CM | POA: Diagnosis present

## 2022-01-17 DIAGNOSIS — K920 Hematemesis: Secondary | ICD-10-CM

## 2022-01-17 DIAGNOSIS — F3341 Major depressive disorder, recurrent, in partial remission: Secondary | ICD-10-CM | POA: Diagnosis present

## 2022-01-17 DIAGNOSIS — I120 Hypertensive chronic kidney disease with stage 5 chronic kidney disease or end stage renal disease: Secondary | ICD-10-CM | POA: Diagnosis present

## 2022-01-17 DIAGNOSIS — Z87891 Personal history of nicotine dependence: Secondary | ICD-10-CM | POA: Diagnosis not present

## 2022-01-17 DIAGNOSIS — I10 Essential (primary) hypertension: Secondary | ICD-10-CM | POA: Diagnosis present

## 2022-01-17 DIAGNOSIS — E785 Hyperlipidemia, unspecified: Secondary | ICD-10-CM | POA: Diagnosis not present

## 2022-01-17 DIAGNOSIS — Z992 Dependence on renal dialysis: Secondary | ICD-10-CM

## 2022-01-17 DIAGNOSIS — J392 Other diseases of pharynx: Secondary | ICD-10-CM | POA: Insufficient documentation

## 2022-01-17 DIAGNOSIS — E1165 Type 2 diabetes mellitus with hyperglycemia: Secondary | ICD-10-CM | POA: Diagnosis present

## 2022-01-17 DIAGNOSIS — J690 Pneumonitis due to inhalation of food and vomit: Secondary | ICD-10-CM | POA: Diagnosis not present

## 2022-01-17 DIAGNOSIS — I462 Cardiac arrest due to underlying cardiac condition: Secondary | ICD-10-CM | POA: Diagnosis not present

## 2022-01-17 DIAGNOSIS — G9349 Other encephalopathy: Secondary | ICD-10-CM | POA: Diagnosis not present

## 2022-01-17 DIAGNOSIS — Z66 Do not resuscitate: Secondary | ICD-10-CM | POA: Diagnosis not present

## 2022-01-17 DIAGNOSIS — F324 Major depressive disorder, single episode, in partial remission: Secondary | ICD-10-CM | POA: Diagnosis not present

## 2022-01-17 DIAGNOSIS — K219 Gastro-esophageal reflux disease without esophagitis: Secondary | ICD-10-CM | POA: Diagnosis present

## 2022-01-17 DIAGNOSIS — S88111A Complete traumatic amputation at level between knee and ankle, right lower leg, initial encounter: Secondary | ICD-10-CM | POA: Diagnosis not present

## 2022-01-17 DIAGNOSIS — E669 Obesity, unspecified: Secondary | ICD-10-CM | POA: Diagnosis present

## 2022-01-17 DIAGNOSIS — Z89512 Acquired absence of left leg below knee: Secondary | ICD-10-CM

## 2022-01-17 DIAGNOSIS — I472 Ventricular tachycardia, unspecified: Secondary | ICD-10-CM | POA: Diagnosis not present

## 2022-01-17 DIAGNOSIS — E1151 Type 2 diabetes mellitus with diabetic peripheral angiopathy without gangrene: Secondary | ICD-10-CM | POA: Diagnosis present

## 2022-01-17 DIAGNOSIS — I959 Hypotension, unspecified: Secondary | ICD-10-CM | POA: Diagnosis not present

## 2022-01-17 DIAGNOSIS — Z7902 Long term (current) use of antithrombotics/antiplatelets: Secondary | ICD-10-CM

## 2022-01-17 DIAGNOSIS — Z89511 Acquired absence of right leg below knee: Secondary | ICD-10-CM | POA: Diagnosis not present

## 2022-01-17 DIAGNOSIS — Z515 Encounter for palliative care: Secondary | ICD-10-CM | POA: Diagnosis not present

## 2022-01-17 DIAGNOSIS — Z91048 Other nonmedicinal substance allergy status: Secondary | ICD-10-CM

## 2022-01-17 DIAGNOSIS — R042 Hemoptysis: Secondary | ICD-10-CM | POA: Diagnosis present

## 2022-01-17 DIAGNOSIS — D638 Anemia in other chronic diseases classified elsewhere: Secondary | ICD-10-CM | POA: Diagnosis present

## 2022-01-17 DIAGNOSIS — Z20822 Contact with and (suspected) exposure to covid-19: Secondary | ICD-10-CM | POA: Diagnosis present

## 2022-01-17 DIAGNOSIS — G4733 Obstructive sleep apnea (adult) (pediatric): Secondary | ICD-10-CM | POA: Diagnosis present

## 2022-01-17 DIAGNOSIS — E875 Hyperkalemia: Secondary | ICD-10-CM | POA: Diagnosis not present

## 2022-01-17 DIAGNOSIS — E1122 Type 2 diabetes mellitus with diabetic chronic kidney disease: Secondary | ICD-10-CM | POA: Diagnosis present

## 2022-01-17 DIAGNOSIS — G9341 Metabolic encephalopathy: Secondary | ICD-10-CM | POA: Diagnosis not present

## 2022-01-17 DIAGNOSIS — J869 Pyothorax without fistula: Secondary | ICD-10-CM | POA: Diagnosis not present

## 2022-01-17 DIAGNOSIS — R579 Shock, unspecified: Secondary | ICD-10-CM

## 2022-01-17 DIAGNOSIS — G253 Myoclonus: Secondary | ICD-10-CM | POA: Diagnosis not present

## 2022-01-17 DIAGNOSIS — Z7982 Long term (current) use of aspirin: Secondary | ICD-10-CM

## 2022-01-17 DIAGNOSIS — J9601 Acute respiratory failure with hypoxia: Secondary | ICD-10-CM | POA: Diagnosis not present

## 2022-01-17 DIAGNOSIS — I469 Cardiac arrest, cause unspecified: Secondary | ICD-10-CM | POA: Diagnosis not present

## 2022-01-17 DIAGNOSIS — F419 Anxiety disorder, unspecified: Secondary | ICD-10-CM | POA: Diagnosis present

## 2022-01-17 DIAGNOSIS — Z6830 Body mass index (BMI) 30.0-30.9, adult: Secondary | ICD-10-CM

## 2022-01-17 DIAGNOSIS — Z807 Family history of other malignant neoplasms of lymphoid, hematopoietic and related tissues: Secondary | ICD-10-CM

## 2022-01-17 LAB — CBC
HCT: 31.6 % — ABNORMAL LOW (ref 39.0–52.0)
HCT: 30.8 % — ABNORMAL LOW (ref 39.0–52.0)
HCT: 27.5 % — ABNORMAL LOW (ref 39.0–52.0)
Hemoglobin: 10.3 g/dL — ABNORMAL LOW (ref 13.0–17.0)
Hemoglobin: 9.7 g/dL — ABNORMAL LOW (ref 13.0–17.0)
Hemoglobin: 8.9 g/dL — ABNORMAL LOW (ref 13.0–17.0)
MCH: 34.7 pg — ABNORMAL HIGH (ref 26.0–34.0)
MCH: 33.8 pg (ref 26.0–34.0)
MCH: 34.5 pg — ABNORMAL HIGH (ref 26.0–34.0)
MCHC: 32.6 g/dL (ref 30.0–36.0)
MCHC: 31.5 g/dL (ref 30.0–36.0)
MCHC: 32.4 g/dL (ref 30.0–36.0)
MCV: 106.4 fL — ABNORMAL HIGH (ref 80.0–100.0)
MCV: 107.3 fL — ABNORMAL HIGH (ref 80.0–100.0)
MCV: 106.6 fL — ABNORMAL HIGH (ref 80.0–100.0)
Platelets: 201 10*3/uL (ref 150–400)
Platelets: 222 10*3/uL (ref 150–400)
Platelets: 234 10*3/uL (ref 150–400)
RBC: 2.97 MIL/uL — ABNORMAL LOW (ref 4.22–5.81)
RBC: 2.87 MIL/uL — ABNORMAL LOW (ref 4.22–5.81)
RBC: 2.58 MIL/uL — ABNORMAL LOW (ref 4.22–5.81)
RDW: 14.9 % (ref 11.5–15.5)
RDW: 14.7 % (ref 11.5–15.5)
RDW: 14.9 % (ref 11.5–15.5)
WBC: 16.9 10*3/uL — ABNORMAL HIGH (ref 4.0–10.5)
WBC: 20.2 10*3/uL — ABNORMAL HIGH (ref 4.0–10.5)
WBC: 27.1 10*3/uL — ABNORMAL HIGH (ref 4.0–10.5)
nRBC: 0 % (ref 0.0–0.2)
nRBC: 0 % (ref 0.0–0.2)
nRBC: 0 % (ref 0.0–0.2)

## 2022-01-17 LAB — COMPREHENSIVE METABOLIC PANEL
ALT: 13 U/L (ref 0–44)
AST: 19 U/L (ref 15–41)
Albumin: 2.8 g/dL — ABNORMAL LOW (ref 3.5–5.0)
Alkaline Phosphatase: 116 U/L (ref 38–126)
Anion gap: 13 (ref 5–15)
BUN: 29 mg/dL — ABNORMAL HIGH (ref 6–20)
CO2: 28 mmol/L (ref 22–32)
Calcium: 9.1 mg/dL (ref 8.9–10.3)
Chloride: 92 mmol/L — ABNORMAL LOW (ref 98–111)
Creatinine, Ser: 5.39 mg/dL — ABNORMAL HIGH (ref 0.61–1.24)
GFR, Estimated: 12 mL/min — ABNORMAL LOW (ref >60)
Glucose, Bld: 84 mg/dL (ref 70–99)
Potassium: 4 mmol/L (ref 3.5–5.1)
Sodium: 133 mmol/L — ABNORMAL LOW (ref 135–145)
Total Bilirubin: 1.1 mg/dL (ref 0.3–1.2)
Total Protein: 6.9 g/dL (ref 6.5–8.1)

## 2022-01-17 LAB — BASIC METABOLIC PANEL
Anion gap: 15 (ref 5–15)
BUN: 49 mg/dL — ABNORMAL HIGH (ref 6–20)
CO2: 27 mmol/L (ref 22–32)
Calcium: 8.6 mg/dL — ABNORMAL LOW (ref 8.9–10.3)
Chloride: 91 mmol/L — ABNORMAL LOW (ref 98–111)
Creatinine, Ser: 6.27 mg/dL — ABNORMAL HIGH (ref 0.61–1.24)
GFR, Estimated: 10 mL/min — ABNORMAL LOW (ref >60)
Glucose, Bld: 120 mg/dL — ABNORMAL HIGH (ref 70–99)
Potassium: 5.4 mmol/L — ABNORMAL HIGH (ref 3.5–5.1)
Sodium: 133 mmol/L — ABNORMAL LOW (ref 135–145)

## 2022-01-17 LAB — PROTIME-INR
INR: 1.4 — ABNORMAL HIGH (ref 0.8–1.2)
Prothrombin Time: 16.6 seconds — ABNORMAL HIGH (ref 11.4–15.2)

## 2022-01-17 LAB — CBC WITH DIFFERENTIAL/PLATELET
Abs Immature Granulocytes: 0.16 10*3/uL — ABNORMAL HIGH (ref 0.00–0.07)
Basophils Absolute: 0.1 10*3/uL (ref 0.0–0.1)
Basophils Relative: 1 %
Eosinophils Absolute: 0.3 10*3/uL (ref 0.0–0.5)
Eosinophils Relative: 3 %
HCT: 36.3 % — ABNORMAL LOW (ref 39.0–52.0)
Hemoglobin: 11.1 g/dL — ABNORMAL LOW (ref 13.0–17.0)
Immature Granulocytes: 2 %
Lymphocytes Relative: 7 %
Lymphs Abs: 0.7 10*3/uL (ref 0.7–4.0)
MCH: 32.7 pg (ref 26.0–34.0)
MCHC: 30.6 g/dL (ref 30.0–36.0)
MCV: 107.1 fL — ABNORMAL HIGH (ref 80.0–100.0)
Monocytes Absolute: 0.9 10*3/uL (ref 0.1–1.0)
Monocytes Relative: 10 %
Neutro Abs: 7.3 10*3/uL (ref 1.7–7.7)
Neutrophils Relative %: 77 %
Platelets: 190 10*3/uL (ref 150–400)
RBC: 3.39 MIL/uL — ABNORMAL LOW (ref 4.22–5.81)
RDW: 14.8 % (ref 11.5–15.5)
WBC: 9.5 10*3/uL (ref 4.0–10.5)
nRBC: 0 % (ref 0.0–0.2)

## 2022-01-17 LAB — MAGNESIUM: Magnesium: 1.6 mg/dL — ABNORMAL LOW (ref 1.7–2.4)

## 2022-01-17 LAB — SEDIMENTATION RATE: Sed Rate: 74 mm/hr — ABNORMAL HIGH (ref 0–16)

## 2022-01-17 LAB — C-REACTIVE PROTEIN: CRP: 8.6 mg/dL — ABNORMAL HIGH (ref <1.0)

## 2022-01-17 LAB — TSH: TSH: 4.828 u[IU]/mL — ABNORMAL HIGH (ref 0.350–4.500)

## 2022-01-17 SURGERY — CANCELLED PROCEDURE

## 2022-01-17 MED ORDER — TRANEXAMIC ACID FOR INHALATION
500.0000 mg | Freq: Once | RESPIRATORY_TRACT | Status: AC
  Administered 2022-01-17: 500 mg via RESPIRATORY_TRACT
  Filled 2022-01-17: qty 10

## 2022-01-17 MED ORDER — FERRIC CITRATE 1 GM 210 MG(FE) PO TABS: 630.0000 mg | ORAL_TABLET | Freq: Three times a day (TID) | ORAL | Status: DC

## 2022-01-17 MED ORDER — HYDROMORPHONE HCL 2 MG PO TABS
4.0000 mg | ORAL_TABLET | ORAL | Status: DC | PRN
  Administered 2022-01-17: 4 mg via ORAL
  Filled 2022-01-17: qty 2

## 2022-01-17 MED ORDER — METOPROLOL TARTRATE 12.5 MG HALF TABLET: 12.5000 mg | ORAL_TABLET | Freq: Two times a day (BID) | ORAL | Status: DC

## 2022-01-17 MED ORDER — ZINC SULFATE 220 (50 ZN) MG PO CAPS: 220.0000 mg | ORAL_CAPSULE | Freq: Every day | ORAL | Status: DC

## 2022-01-17 MED ORDER — MELATONIN 5 MG PO TABS: 10.0000 mg | ORAL_TABLET | Freq: Every day | ORAL | Status: DC

## 2022-01-17 MED ORDER — SODIUM CHLORIDE 0.9 % IV SOLN: 25.0000 g | INTRAVENOUS | Status: DC

## 2022-01-17 MED ORDER — METHYLPREDNISOLONE SODIUM SUCC 40 MG IJ SOLR
40.0000 mg | Freq: Two times a day (BID) | INTRAMUSCULAR | Status: DC
Start: 2022-01-17 — End: 2022-01-18
  Administered 2022-01-17 – 2022-01-18 (×2): 40 mg via INTRAVENOUS
  Filled 2022-01-17 (×2): qty 1

## 2022-01-17 MED ORDER — DULOXETINE HCL 60 MG PO CPEP: 60.0000 mg | ORAL_CAPSULE | Freq: Every day | ORAL | Status: DC

## 2022-01-17 MED ORDER — MIDODRINE HCL 5 MG PO TABS
10.0000 mg | ORAL_TABLET | ORAL | Status: DC
Start: 2022-01-19 — End: 2022-01-18

## 2022-01-17 MED ORDER — PANTOPRAZOLE SODIUM 40 MG IV SOLR
40.0000 mg | Freq: Two times a day (BID) | INTRAVENOUS | Status: DC
Start: 2022-01-17 — End: 2022-01-17

## 2022-01-17 MED ORDER — SODIUM CHLORIDE 0.9% FLUSH
3.0000 mL | Freq: Two times a day (BID) | INTRAVENOUS | Status: DC
  Administered 2022-01-17: 3 mL via INTRAVENOUS

## 2022-01-17 MED ORDER — BROMOCRIPTINE MESYLATE 2.5 MG PO TABS
2.5000 mg | ORAL_TABLET | Freq: Every day | ORAL | Status: DC
  Filled 2022-01-17: qty 1

## 2022-01-17 MED ORDER — CEFEPIME HCL 1 G IJ SOLR
1.0000 g | Freq: Every day | INTRAMUSCULAR | Status: DC
  Administered 2022-01-17: 1 g via INTRAVENOUS
  Filled 2022-01-17 (×2): qty 10

## 2022-01-17 MED ORDER — PANTOPRAZOLE SODIUM 40 MG IV SOLR: 40.0000 mg | Freq: Two times a day (BID) | INTRAVENOUS | Status: DC

## 2022-01-17 MED ORDER — PANTOPRAZOLE SODIUM 40 MG PO TBEC
40.0000 mg | DELAYED_RELEASE_TABLET | Freq: Every day | ORAL | Status: DC
Start: 2022-01-17 — End: 2022-01-17

## 2022-01-17 MED ORDER — BUPROPION HCL ER (XL) 300 MG PO TB24: 300.0000 mg | ORAL_TABLET | Freq: Every day | ORAL | Status: DC

## 2022-01-17 MED ORDER — ATORVASTATIN CALCIUM 40 MG PO TABS: 40.0000 mg | ORAL_TABLET | Freq: Every day | ORAL | Status: DC

## 2022-01-17 MED ORDER — ASCORBIC ACID 500 MG PO TABS
1000.0000 mg | ORAL_TABLET | Freq: Every day | ORAL | Status: DC
Start: 2022-01-17 — End: 2022-01-17

## 2022-01-17 MED ORDER — TAMSULOSIN HCL 0.4 MG PO CAPS: 0.4000 mg | ORAL_CAPSULE | Freq: Every day | ORAL | Status: DC

## 2022-01-17 MED ORDER — POLYETHYLENE GLYCOL 3350 17 G PO PACK: 17.0000 g | PACK | Freq: Every day | ORAL | Status: DC | PRN

## 2022-01-17 MED ORDER — MAGNESIUM SULFATE 2 GM/50ML IV SOLN
2.0000 g | Freq: Once | INTRAVENOUS | Status: AC
  Administered 2022-01-17: 2 g via INTRAVENOUS
  Filled 2022-01-17: qty 50

## 2022-01-17 MED ORDER — ACETAMINOPHEN 325 MG PO TABS: 325.0000 mg | ORAL_TABLET | ORAL | Status: DC | PRN

## 2022-01-17 MED ORDER — DOCUSATE SODIUM 100 MG PO CAPS: 100.0000 mg | ORAL_CAPSULE | Freq: Every day | ORAL | Status: DC

## 2022-01-17 MED ORDER — HYDROMORPHONE HCL 1 MG/ML IJ SOLN: 1.0000 mg | INTRAMUSCULAR | Status: DC | PRN

## 2022-01-17 MED ORDER — AMITRIPTYLINE HCL 10 MG PO TABS
10.0000 mg | ORAL_TABLET | Freq: Every day | ORAL | Status: DC
  Filled 2022-01-17: qty 1

## 2022-01-17 MED ORDER — BISACODYL 5 MG PO TBEC: 5.0000 mg | DELAYED_RELEASE_TABLET | Freq: Every day | ORAL | Status: DC | PRN

## 2022-01-17 MED ORDER — RENA-VITE PO TABS: 1.0000 | ORAL_TABLET | Freq: Every day | ORAL | Status: DC

## 2022-01-17 MED ORDER — PANTOPRAZOLE SODIUM 40 MG IV SOLR
40.0000 mg | Freq: Once | INTRAVENOUS | Status: AC
  Administered 2022-01-17: 40 mg via INTRAVENOUS
  Filled 2022-01-17: qty 10

## 2022-01-17 MED ORDER — GABAPENTIN 100 MG PO CAPS
100.0000 mg | ORAL_CAPSULE | Freq: Three times a day (TID) | ORAL | Status: DC
Start: 2022-01-17 — End: 2022-01-17

## 2022-01-17 MED ORDER — SODIUM CHLORIDE 0.9 % IV SOLN: INTRAVENOUS | Status: DC

## 2022-01-17 SURGICAL SUPPLY — 14 items

## 2022-01-17 NOTE — Progress Notes (Signed)
Pt is having continuous vomiting of blood and multiple runs of Vtach. BP  is 85/72 (77) and spo2 sats are at 95% on 2L O2. MD was paged and informed RN that he will be coming to see the pt.

## 2022-01-17 NOTE — Progress Notes (Signed)
Inpatient Rehabilitation Discharge Medication Review by a Pharmacist  A complete drug regimen review was completed for this patient to identify any potential clinically significant medication issues.  High Risk Drug Classes Is patient taking? Indication by Medication  Antipsychotic No   Anticoagulant No   Antibiotic No   Opioid Yes Hydromorphone - pain  Antiplatelet No - on hold   Hypoglycemics/insulin No   Vasoactive Medication Yes Metoprolol, midodrine - BP  Chemotherapy No   Other Yes Atorvastatin - HLD Gabapentin, Amitriptyline, duloxetine - pain Bromocriptine - pain Bupropion - mood Ferric citrate - renal / HD Pantoprazole - GERD Tamsulosin - BPH     Type of Medication Issue Identified Description of Issue Recommendation(s)  Drug Interaction(s) (clinically significant)     Duplicate Therapy     Allergy     No Medication Administration End Date     Incorrect Dose     Additional Drug Therapy Needed     Significant med changes from prior encounter (inform family/care partners about these prior to discharge).    Other  Pt's clopidogrel, ASA (for CVA) currently on hold for transfer back to inpt unit.  Hold for now and will f/u ability to resume    Clinically significant medication issues were identified that warrant physician communication and completion of prescribed/recommended actions by midnight of the next day:  No  Pharmacist comments: Pt discharged back to inpatient unit due to retching/coughing up increased amount of blood. Bloody sputum was controlled at this time. ASA and clopidogrel will be held at this time.  Time spent performing this drug regimen review (minutes):  20 minutes   Sherlon Handing, PharmD, BCPS Please see amion for complete clinical pharmacist phone list 01/24/2022 2:46 PM

## 2022-01-17 NOTE — Progress Notes (Signed)
PROGRESS NOTE   Subjective/Complaints:  Pt having coughing up moderate amounts of bright red blood this AM- has occurred ~ 5+ times by 9:30am- Started this Am No nausea, no retching, dry heaves now or overnight.   No nose bleed Never had liver issues per pt, but stopped drinking 18 months ago.    ROS:   Pt denies SOB, abd pain, CP, N/V/(+) C/D, and vision changes   Objective:   No results found. Recent Labs    01/14/22 1154 01/16/22 1427  WBC 11.5* 10.5  HGB 11.4* 10.8*  HCT 35.4* 34.6*  PLT 178 181    Recent Labs    01/14/22 1154 01/16/22 1427  NA 130* 133*  K 4.8 4.4  CL 87* 93*  CO2 23 26  GLUCOSE 118* 127*  BUN 53* 48*  CREATININE 7.43* 6.98*  CALCIUM 9.4 9.0    Intake/Output Summary (Last 24 hours) at 02/12/2022 0935 Last data filed at 01/23/2022 0100 Gross per 24 hour  Intake 390 ml  Output 2300 ml  Net -1910 ml        Physical Exam: Vital Signs Blood pressure 122/66, pulse 76, temperature 98.3 F (36.8 C), resp. rate 16, height 5\' 4"  (1.626 m), weight 97 kg, SpO2 100 %.    General: awake, alert, appropriate, sitting EOB; spitting up blood/hematemesis; NAD HENT: conjugate gaze; oropharynx tongue and mouth/lips caked in dried blood- bright red to darker; no signs of epistaxis or bleeding in oropharynx CV: regular rate and rhythm- BP 122/66 at 8;30am; no JVD Pulmonary: CTA B/L; no W/R/R- good air movement GI: soft, NT, ND, (+)BS- protuberant- Rectal done by GI NP and was very constipated/a lot of stool in rectum Psychiatric: appropriate Neurological: Ox3 Extremities: No clubbing, cyanosis, or edema. Pulses are 2+ Psych: Pt's affect is flat. 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  IV in RUE looks okay lesion on right index finger   Neuro: Alert to  person place and time.  Decreased light touch from knees down bilaterally. Musculoskeletal: Cervical back: Supple non-tender. Comments: UE strength 5 - forward 5 in biceps, triceps, WB grip and after a bilateral.  LE strength 5 -/5 on L EE and HF/KE/KF: And at least antigravity on RLE-due to pain cannot test against resistance.  Left BKA with a lot of scar scabs pleasant black eschar on side of left BKA. Dressed with ACE wrap   Assessment/Plan: 1. Functional deficits which require 3+ hours per day of interdisciplinary therapy in a comprehensive inpatient rehab setting. Physiatrist is providing close team supervision and 24 hour management of active medical problems listed below. Physiatrist and rehab team continue to assess barriers to discharge/monitor patient progress toward functional and medical goals  Care Tool:  Bathing    Body parts bathed by patient: Right arm, Left arm, Chest, Abdomen, Right upper leg, Left upper leg, Face, Front perineal area, Buttocks   Body parts bathed by helper: Front perineal area, Buttocks Body parts n/a: Right lower leg, Left lower leg   Bathing assist Assist Level: Supervision/Verbal cueing  Upper Body Dressing/Undressing Upper body dressing   What is the patient wearing?: Pull over shirt    Upper body assist Assist Level: Independent    Lower Body Dressing/Undressing Lower body dressing      What is the patient wearing?: Underwear/pull up, Pants     Lower body assist Assist for lower body dressing: Minimal Assistance - Patient > 75%     Toileting Toileting Toileting Activity did not occur Landscape architect and hygiene only): Refused  Toileting assist Assist for toileting: Minimal Assistance - Patient > 75%     Transfers Chair/bed transfer  Transfers assist     Chair/bed transfer assist level: Minimal Assistance - Patient > 75% (slideboard)     Locomotion Ambulation   Ambulation assist   Ambulation activity did not  occur: Safety/medical concerns (pain, weakness, decreased balance, unable to use L prosthetic due to poor wound healing)          Walk 10 feet activity   Assist  Walk 10 feet activity did not occur: Safety/medical concerns (pain, weakness, decreased balance, unable to use L prosthetic due to poor wound healing)        Walk 50 feet activity   Assist Walk 50 feet with 2 turns activity did not occur: Safety/medical concerns (pain, weakness, decreased balance, unable to use L prosthetic due to poor wound healing)         Walk 150 feet activity   Assist Walk 150 feet activity did not occur: Safety/medical concerns (pain, weakness, decreased balance, unable to use L prosthetic due to poor wound healing)         Walk 10 feet on uneven surface  activity   Assist Walk 10 feet on uneven surfaces activity did not occur: Safety/medical concerns (pain, weakness, decreased balance, unable to use L prosthetic due to poor wound healing)         Wheelchair     Assist Is the patient using a wheelchair?: Yes Type of Wheelchair: Power Wheelchair activity did not occur: Safety/medical concerns (pain, weakness)  Wheelchair assist level: Supervision/Verbal cueing Max wheelchair distance: >169ft    Wheelchair 50 feet with 2 turns activity    Assist    Wheelchair 50 feet with 2 turns activity did not occur: Safety/medical concerns (pain, weakness)   Assist Level: Supervision/Verbal cueing   Wheelchair 150 feet activity     Assist  Wheelchair 150 feet activity did not occur: Safety/medical concerns (pain, weakness)   Assist Level: Supervision/Verbal cueing   Blood pressure 122/66, pulse 76, temperature 98.3 F (36.8 C), resp. rate 16, height 5\' 4"  (1.626 m), weight 97 kg, SpO2 100 %.  Medical Problem List and Plan: 1. 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             -patient may shower              -ELOS/Goals: 7-10 days w/c level supervision to mod I  D/c to acute today- have called and spoke with Dr Peyton Najjar as well as GI- they will likely scope pt to look for source- might need to also get ENT involved? 2.  Impaired mobility: continue Heparin             -antiplatelet therapy: Plavix and aspirin  7/1- hold all agents- plavix/ASA, and Lovenox 3. Residual limb pain: increase Dilaudid to 4mg  q4H prn.              --will continue oxycodone  15 mg q 4 hours and consider methadone 2.5 mg in place of IV Dilaudid             -d/c methadone since will be unable to follow-up with methadone clinic outpatient. 4. Insomnia:             -continue melatonin q HS 5. Neuropsych/cognition: This patient is capable of making decisions on his own behalf. 6. Calciphylaxis skin lesions, discussed with nephrology and no derm/surgical consult recommended. Will consult wound care for recommendations as well.  7. Fluids/Electrolytes/Nutrition: Strict Is and Os             --HD/per nephrology 8: ESRD: HD on Monday/Wednesday/Friday                         --chronic hypotension on midodrine pre-HD 9: Essential hypertension: continue metoprolol 25 mg BID             -- Amlodipine discontinued     02/02/2022    8:05 AM 01/21/2022    4:59 AM 01/16/2022    8:19 PM  Vitals with BMI  Systolic [phone]  Diastolic 66 92 79  Pulse 76 70 75    7/1- BP stable- hasn't dropped 10: Peripheral arterial disease s/p intervention             --maintained on aspirin, Plavix and statin 11: DM-2 with hyperglycemia: Hyperglycemia resolved off all medications. D/c CBG checks given excellent control 72-109  7/1- pt's CBG's on low side- if needs IVFs, since NPO, will start D5NS if need be.  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 Vitamin D deficiency: start ergocalciferol 50,000U once per week for 7 weeks.  17. Depression: continue Cymbalta and Wellbutrin 6/25 Well-controlled. 18. Phantom limb pain: increase Cymbalta to 60mg  daily.  6/25 Continue 60 mg Cymbalta daily. Add Amitriptyline 10mg  HS. Increase Gabapentin to 100mg  TID  19. Likely GI bleed  7/1- have called IM and GI- started IV and IV Protonix- made NPO- will transfer to acute-tele bed.      I spent a total of 57   minutes on total care today- >50% coordination of care- due to GI bleed- and d/w IM, GI and seeing pt 4x.     LOS: 11 days A FACE TO FACE EVALUATION WAS PERFORMED  Michael Riggs 02/12/2022, 9:35 AM

## 2022-01-17 NOTE — Progress Notes (Signed)
Bedside nurse alerted writer that patient was coughing up frank blood as of beginning of shift. VS stable, pt alert and oriented.  Called Dr. Dagoberto Ligas, MD to assess patient at bedside.  Dr. Dagoberto Ligas, MD assessed patient and paged GI and Internal Medicine.  Per Dr. Dagoberto Ligas, MD pt will be transferred off unit.  Awaiting bed placement.

## 2022-01-17 NOTE — Progress Notes (Addendum)
Paged Rapid RN, to come assess pt LOC. Pt showing tremors, incoherent speech, and trying to jump out of bed. Rapid RN assessed pt and advised RN to page MD for worsening delirium. Awaiting MD response.

## 2022-01-17 NOTE — H&P (Addendum)
History and Physical    Michael Riggs KPT:[id number] DOB: Dec 07, 1970 DOA: 02/01/2022  PCP: [name], MD  Patient coming from: Rehab  Chief Complaint: Hemoptysis, hematemesis?  HPI: Michael Riggs is a 51 y.o. male with medical history significant of anemia, ESRD, calciphylaxis, DM2, Depression, HLD, HTN, h/o stroke, recent Knee amputation in rehab with plans to return home today.  When he awoke this morning, he noticed blood in his mouth and on his teeth.  When he sat up, he felt a large amount of blood come up and he spit it out.  Blood is maroon to bright red at times.  He has some mild chest pain with coughing.  He has not had nosebleeds or other bleeding.  He denies GI bleeding, melena, hematochezia.  He is on aspirin and plavix at baseline.  He has had no fever, chills, nausea or change in appetite.  His LFTs were mildly elevated on admission, but these have resolved.  Initial CBC this morning showed stable H/H.  GI was consulted, but after consultation and review of CXR felt this was more likely to be upper airway vs. Pulmonary bleeding which I agree with.   CXR showed multifocal airspace disease, possibly pneumonia vs. Fluid.  He has a new H/H now which remains stable around 10, however, WBC is up to 16.   I discussed course with Dr. Valeta Harms in PCCM, who recommended abx, steroids and trial of nebulized TXA to see if the bleeding will stop.  Nursing is requesting transfer to a higher level of care, which I put in.    Review of Systems: As per HPI otherwise all other systems reviewed and are negative.   Past Medical History:  Diagnosis Date   Anemia    Anemia of chronic disease    Anxiety    ARF (acute renal failure) ([place]) 01/30/2019   Calciphylaxis    Controlled type 2 diabetes mellitus with hyperglycemia, without long-term current use of insulin ([place])    Debility 02/09/2019   Depression    Diabetes mellitus type 2 in obese ([place]) 07/04/2013   Diabetes mellitus with  peripheral vascular disease ([place])    type 2 no meds, lost 100 lbs   Dyslipidemia    Dyspnea    inhaler   End stage renal disease ([place])    ESRD (end stage renal disease) on dialysis ([place]) 01/2019   MWFS   ESRD on dialysis [facility])    Essential hypertension    Fluid overload 02/04/2019   GERD (gastroesophageal reflux disease)    Goals of care, counseling/discussion    Habitual alcohol use 07/04/2013   Headache    Hyperkalemia 01/30/2019   Hypertension    Hypoglycemia 01/30/2019   Hypokalemia 01/30/2019   Labile blood pressure    Leukocytosis    Lower GI bleed 05/14/2019   Macrocytic anemia 01/30/2019   Obesity, Class II, BMI 35-39.9, with comorbidity 07/04/2013   Palliative care encounter    PDR (proliferative diabetic retinopathy) ([place]) 12/14/2013   Physical deconditioning    Pressure injury of skin 04/27/2019   Pruritus    Scrotal edema    Scrotal pain    Sleep apnea    uses CPAP   Sleep disturbance    Slow transit constipation    Status post below-knee amputation of left lower extremity ([place])    Stroke ([place])    mini -shown on CT scan   Syphilis 01/2019   Urinary retention    Venous hypertension 09/22/2013   Wound  infection 03/28/2019    Past Surgical History:  Procedure Laterality Date   ABDOMINAL AORTOGRAM W/LOWER EXTREMITY N/A 10/15/2021   Procedure: ABDOMINAL AORTOGRAM W/LOWER EXTREMITY;  Surgeon: Broadus John, MD;  Location: [place] [facility];  Service: Cardiovascular;  Laterality: N/A;   AMPUTATION Right 01/02/2022   Procedure: RIGHT BELOW KNEE AMPUTATION;  Surgeon: [name], MD;  Location: [place];  Service: Orthopedics;  Laterality: Right;   AV FISTULA PLACEMENT Left 01/09/2019   Procedure: ARTERIOVENOUS (AV) FISTULA CREATION LEFT UPPER ARM;  Surgeon: [name], MD;  Location[facility];  Service: Vascular;  Laterality: Left;   [place] Left 07/06/2019   Procedure: BASILIC VEIN TRANSPOSITION SECOND STAGE LEFT ARM;  Surgeon: [name], MD;   Location: [place];  Service: Vascular;  Laterality: Left;   below the knee amputation Left    EYE SURGERY Bilateral    FLEXIBLE SIGMOIDOSCOPY N/A 05/15/2019   Procedure: FLEXIBLE SIGMOIDOSCOPY;  Surgeon: Irving Copas., MD;  Location: [place];  Service: Gastroenterology;  Laterality: N/A;   HEMOSTASIS CLIP PLACEMENT  05/15/2019   Procedure: HEMOSTASIS CLIP PLACEMENT;  Surgeon: Irving Copas., MD;  Location: [place];  Service: Gastroenterology;;   HOT HEMOSTASIS N/A 05/15/2019   Procedure: HOT HEMOSTASIS (ARGON PLASMA COAGULATION/BICAP);  Surgeon: Irving Copas., MD;  Location: [place];  Service: Gastroenterology;  Laterality: N/A;   I & D EXTREMITY Right 10/10/2021   Procedure: RIGHT ACHILLES DEBRIDEMENT AND TISSUE GRAFT;  Surgeon: [name], MD;  Location: [place];  Service: Orthopedics;  Laterality: Right;   IR FLUORO GUIDE CV LINE RIGHT  01/31/2019   IR FLUORO GUIDE CV LINE RIGHT  02/03/2019   IR THORACENTESIS ASP PLEURAL SPACE W/IMG GUIDE  11/08/2019   IR US GUIDE VASC ACCESS RIGHT  01/31/2019   IR US GUIDE VASC ACCESS RIGHT  02/03/2019   PERIPHERAL VASCULAR BALLOON ANGIOPLASTY  10/15/2021   Procedure: PERIPHERAL VASCULAR BALLOON ANGIOPLASTY;  Surgeon: Broadus John, MD;  Location: [place] [facility];  Service: Cardiovascular;;  rt sfa and trifurcation   SCLEROTHERAPY  05/15/2019   Procedure: SCLEROTHERAPY;  Surgeon: Mansouraty, Telford Nab., MD;  Location: [facility];  Service: Gastroenterology;;    Social History  reports that he quit smoking about 3 years ago. His smoking use included cigarettes. He started smoking about 3 years ago. He has never been exposed to tobacco smoke. He has never used smokeless tobacco. He reports that he does not currently use alcohol. He reports that he does not currently use drugs.  Allergies  Allergen Reactions   Tape Itching    Prefers silk tape over regular tape    Family History  Problem Relation Age of  Onset   Diabetes Mother    Multiple myeloma Mother      Prior to Admission medications   Medication Sig Start Date End Date Taking? Authorizing Provider  acetaminophen (TYLENOL) 325 MG tablet Take 1-2 tablets (325-650 mg total) by mouth every 4 (four) hours as needed for mild pain. 01/16/22   Setzer, Edman Circle, PA-C  amitriptyline (ELAVIL) 10 MG tablet Take 1 tablet (10 mg total) by mouth at bedtime. 01/16/22   Setzer, Edman Circle, PA-C  ascorbic acid (VITAMIN C) 1000 MG tablet Take 1 tablet (1,000 mg total) by mouth daily. 01/28/2022   Setzer, Edman Circle, PA-C  aspirin EC 81 MG tablet Take 1 tablet (81 mg total) by mouth daily. Swallow whole. 01/16/22   Setzer, Edman Circle, PA-C  atorvastatin (  LIPITOR) 40 MG tablet Take 1 tablet (40 mg total) by mouth at bedtime. 01/16/22   Setzer, Edman Circle, PA-C  bisacodyl (DULCOLAX) 5 MG EC tablet Take 1 tablet (5 mg total) by mouth daily as needed for moderate constipation. 01/06/22   Hosie Poisson, MD  bromocriptine (PARLODEL) 2.5 MG tablet Take 1 tablet (2.5 mg total) by mouth at bedtime. 01/16/22   Setzer, Edman Circle, PA-C  buPROPion (WELLBUTRIN XL) 300 MG 24 hr tablet Take 1 tablet (300 mg total) by mouth in the morning. 01/16/22   Setzer, Edman Circle, PA-C  clopidogrel (PLAVIX) 75 MG tablet Take 1 tablet (75 mg total) by mouth daily with breakfast. 01/16/22   Setzer, Edman Circle, PA-C  docusate sodium (COLACE) 100 MG capsule Take 1 capsule (100 mg total) by mouth daily. 01/06/22   Hosie Poisson, MD  DULoxetine (CYMBALTA) 60 MG capsule Take 1 capsule (60 mg total) by mouth daily. 01/31/2022   Setzer, Edman Circle, PA-C  ferric citrate (AURYXIA) 1 GM 210 MG(Fe) tablet Take 3 tablets (630 mg total) by mouth 3 (three) times daily with meals. 01/16/22   Setzer, Edman Circle, PA-C  gabapentin (NEURONTIN) 100 MG capsule Take 1 capsule (100 mg total) by mouth 3 (three) times daily. 01/16/22   Setzer, Edman Circle, PA-C  HYDROmorphone (DILAUDID) 4 MG tablet Take 1 tablet (4 mg total) by mouth every 4 (four)  hours as needed for severe pain. 01/16/22   Setzer, Edman Circle, PA-C  Melatonin 10 MG TABS Take 10 mg by mouth at bedtime. 01/16/22   Setzer, Edman Circle, PA-C  metoprolol tartrate (LOPRESSOR) 25 MG tablet Take 0.5 tablets (12.5 mg total) by mouth 2 (two) times daily. 01/16/22   Setzer, Edman Circle, PA-C  midodrine (PROAMATINE) 10 MG tablet Take 1 tablet (10 mg total) by mouth every Monday, Wednesday, and Friday. 01/16/22   Setzer, Edman Circle, PA-C  multivitamin (RENA-VIT) TABS tablet Take 1 tablet by mouth at bedtime. 01/16/22   Setzer, Edman Circle, PA-C  pantoprazole (PROTONIX) 40 MG tablet Take 1 tablet (40 mg total) by mouth at bedtime. 01/16/22   Setzer, Edman Circle, PA-C  polyethylene glycol (MIRALAX / GLYCOLAX) 17 g packet Take 17 g by mouth daily as needed for mild constipation. 01/06/22   Hosie Poisson, MD  silver sulfADIAZINE (SILVADENE) 1 % cream Apply 1 application. topically daily. Apply to affected area daily plus dry dressing 12/11/21   [name], MD  sodium chloride 0.9 % SOLN 100 mL with sodium thiosulfate 250 MG/ML SOLN 25 g Inject 25 g into the vein every Monday, Wednesday, and Friday with hemodialysis. 01/07/22   Hosie Poisson, MD  tamsulosin (FLOMAX) 0.4 MG CAPS capsule Take 1 capsule (0.4 mg total) by mouth at bedtime. 01/16/22   Setzer, Edman Circle, PA-C  urea 10 % lotion Apply topically 2 (two) times daily. 01/06/22   Hosie Poisson, MD  zinc sulfate 220 (50 Zn) MG capsule Take 1 capsule (220 mg total) by mouth daily. 01/16/22   Setzer, Edman Circle, PA-C    Physical Exam: Vitals: BP: 122/83 Pulse 93 Temp 99.34F Resp 19 100% on 1LNC  Constitutional: NAD, calm, comfortable, sitting up in bed, but spitting every few minutes.  Eyes: lids and conjunctivae normal, on reassessment did appear paler ENMT: Mucous membranes are moist, blood is present in oropharynx and around teeth. Intranasal area showed no blood and no mass bilaterally Neck: normal, supple Respiratory: Initially clear, on reassessment some  bilateral rales are heard in lower lobes.  He is coughing up about 2 teaspoons of blood every few minutes.  Cardiovascular: RR, NR, no murmur noted, he is s/p BKA bilaterally Abdomen: NT, ND, +BS Musculoskeletal: no clubbing / cyanosis. He is s/p BKA bilaterally, AVF present in left upper extremity Skin: He has multiple healed and healing eschars related to calciphylaxis.   Neurologic: Grossly intact, moving easily in bed Psychiatric: Normal judgment and insight. Alert and oriented x 3. Normal mood.    Labs on Admission: I have personally reviewed following labs and imaging studies  CBC: Recent Labs  Lab 01/14/22 1154 01/16/22 1427 01/26/2022 0839 02/16/2022 1244  WBC 11.5* 10.5 9.5 16.9*  NEUTROABS  --   --  7.3  --   HGB 11.4* 10.8* 11.1* 10.3*  HCT 35.4* 34.6* 36.3* 31.6*  MCV 104.7* 107.1* 107.1* 106.4*  PLT 178 181 190 938    Basic Metabolic Panel: Recent Labs  Lab 01/12/22 0628 01/14/22 1154 01/16/22 1427 02/05/2022 0839  NA 131* 130* 133* 133*  K 5.2* 4.8 4.4 4.0  CL 85* 87* 93* 92*  CO2 '26 23 26 28  ' GLUCOSE 83 118* 127* 84  BUN 64* 53* 48* 29*  CREATININE 8.35* 7.43* 6.98* 5.39*  CALCIUM 10.0 9.4 9.0 9.1  PHOS 5.8* 4.4 3.9  --     GFR: Estimated Creatinine Clearance: 17.4 mL/min (A) (by C-G formula based on SCr of 5.39 mg/dL (H)).  Liver Function Tests: Recent Labs  Lab 01/12/22 0628 01/14/22 1154 01/16/22 1427 02/03/2022 0839  AST  --   --   --  19  ALT  --   --   --  13  ALKPHOS  --   --   --  116  BILITOT  --   --   --  1.1  PROT  --   --   --  6.9  ALBUMIN 2.9* 2.8* 2.8* 2.8*    Radiological Exams on Admission: Portable chest 1 View  Result Date: 01/28/2022 CLINICAL DATA:  Shortness of breath, hemoptysis EXAM: PORTABLE CHEST 1 VIEW COMPARISON:  10/17/2021 FINDINGS: Cardiomegaly. Low lung volumes. Patchy bibasilar airspace opacities. Probable small left pleural effusion. No pneumothorax. IMPRESSION: 1. Patchy bibasilar airspace opacities may reflect  edema versus multifocal pneumonia. 2. Cardiomegaly.  Probable small left pleural effusion. Electronically Signed   By: Davina Poke D.O.   On: 01/26/2022 13:58     Assessment/Plan  Hemoptysis Anemia of chronic disease - Initially felt to be GI bleeding, based on CXR and patient report, felt to be more likely from the airway.  No bleeding noted on nares exam.  - CT chest and soft tissue neck ordered - Cefepime for possible pneumonia - IV steroids ordered - Nebulized TXA X 1 - Trend H/H, transfuse as needed - At nursing request, will transfer to progressive bed, currently HD stable - Platelets are stable - Check INR, ESR, CRP  Essential hypertension - Hold metoprolol for now - Continue midodrine with ESRD   ESRD (end stage renal disease) on dialysis (HCC) Calciphylaxis - On HD MWF - On treatment for calciphylaxis during HD - Nephrology following, I let them know he is now inpatient    Dyslipidemia - Continue atorvastatin    Acquired absence of left leg below knee ([place]) Below-knee amputation of right lower extremity (HCC) - PT/OT re-evaluation once improved - Continue TID gapabentin - Continue oral hydromorphone    Major depressive disorder, recurrent, in partial remission (HCC) - Continue amitriptyline, bupropion, duloxetine    PVD (peripheral vascular disease) -  Hold aspirin, plavix until bleeding resolves.      DVT prophylaxis: SCDs - current bleeding  Code Status:   Full  Family Communication:  Brother and SIL at bedside  Disposition Plan:   Patient is from:  Rehab  Anticipated DC to:  Unknown  Anticipated DC date:  Unknown  Anticipated DC barriers: Bleeding, medical stability  Consults called:  Discussed with PCCM  Admission status:  Progressive, IP   Severity of Illness: The appropriate patient status for this patient is INPATIENT. Inpatient status is judged to be reasonable and necessary in order to provide the required intensity of service to ensure the  patient's safety. The patient's presenting symptoms, physical exam findings, and initial radiographic and laboratory data in the context of their chronic comorbidities is felt to place them at high risk for further clinical deterioration. Furthermore, it is not anticipated that the patient will be medically stable for discharge from the hospital within 2 midnights of admission.   * I certify that at the point of admission it is my clinical judgment that the patient will require inpatient hospital care spanning beyond 2 midnights from the point of admission due to high intensity of service, high risk for further deterioration and high frequency of surveillance required.Gilles Chiquito MD [facility]  How to contact the [facility] Attending or Consulting provider [place] or covering provider during after hours [place], for this patient?   Check the care team in [facility] and look for a) attending/consulting [facility] provider listed and b) the [facility] team listed Log into [email or web address] and use [place] universal password to access. If you do not have the password, please contact the hospital operator. Locate the [facility] provider you are looking for under [facility] and page to a number that you can be directly reached. If you still have difficulty reaching the provider, please page the [facility] (Director on Call) for the Hospitalists listed on amion for assistance.  01/26/2022, 4:04 PM

## 2022-01-17 NOTE — Progress Notes (Addendum)
Patient was seen for continued hemoptysis vs hematemesis and frequent ectopy.   He is in no distress, speaking full sentences, upset about being NPO is his only complaint.   HR low 100s and MAP 70s.   He reports vomiting earlier with some blood, but now has cough with red blood.   Reviewed PCCM and GI notes and repeated chemistries.   Plan to change PPI to IV, hold his oral medications for now, repeat TXA neb, replace magnesium, continue to trend H&H and transfuse if needed.   Discussed plan with patient and RN at bedside.   ADDENDUM: Patient seen again for delirium. He is easy to wake but disoriented, appears nonfocal, has hx of alcoholism but was reportedly in remission and was admitted from [facility] where he had been since 01/06/22. He has OSA but not on CPAP tonight d/t aspiration concern in setting of vomiting/hemoptysis.   Plan to check ABG, use delirium precautions.

## 2022-01-17 NOTE — Progress Notes (Addendum)
On morning rounds to meet and greet my patient, patient was found to have a moderate amount of dried blood on his lips and was actively coughing, spitting, and vomiting very small amounts of blood from his mouth. Pt reported no abdominal/rectal pain or rectal bleeding, vital signs were stable as is charted in the flowsheets from that assessment, wall suction was established and available if needed, Luetta Nutting, FNP and Dr. Dagoberto Ligas were both on the rehab unit and notified in person of patient status. Advised patient nothing by mouth, IV team consulted for access, vital signs to be continuously monitored Q1 until patient bed is ready on acute unit, and nurse will call with report and transfer patient at that time. Order placed for IV protonix per verbal order. 1Liter [place] oxygen support added to patient for saturation levels bouncing down into the 80's. Marjorie Smolder, Rn charge made aware of clinical status change of patient.     1132: Rn called to room at patient request. Pt found to be retching and coughing up an increased amount of blood in which the patient described as feeling "chunky". Suction initiated at this time to assist patient in more effective removal of bloody sputum. Dr Dagoberto Ligas called and updated on patient status and confirmed ok for this Rn to proceed w/ use of the wall suction at this time. Bloody sputum controlled at this time w/ current interventions. Vitals stable w/ oxygen sats 100% on 1L [place]

## 2022-01-17 NOTE — Consult Note (Addendum)
Consultation Note   Referring Provider: [facility] PCP: [name], MD Primary Gastroenterologist: Althia Forts Reason for consultation: Hematemesis  Hospital Day: 64  Assessment   # 51 yo male with acute hemoptysis vrs hematemesis vrs oropharyngeal bleeding on plavix and lovenox. Hemodynamically stable. No significant decline in hgb. No overt blood in stool on DRE. Not clearly a GI bleed. He has no associated nausea or other focal GI symptoms.   # History of GI from rectal ulcer. Probably stercoral ulcer. On exam today he has very hard stool in vault  # ESRD on HD  # DM  # PVD, s/p bilateral lower extremity amputations ( most recent was mid June 2023)   # See PMH for additional medical problems   Plan   Monitor hgb Plavix and lovenox have been put on hold for now NPO Agree with IV PPI Keep HOB elevated to avoid aspiration Will discuss with Dr. Henrene Pastor as to whether we pursue EGD. He may need ENT consult if GI evaluation negative.  For precautions he will be transferred to Telemetry bed Dulcolax suppositories daily.    HPI   Michael Riggs is a 51 y.o. male with a past medical history significant for ESRD on HD, HTN, PVD, HLD, DM, depression.  See PMH for any additional medical problems.  We saw Michael Riggs in the hospital in 2020 for hematochezia.  Flexible sigmoidoscopy remarkable for rectal ulcer which was treated with cautery and clip. Patient hasn't been seen by [place] since.   Patient had a prolonged hospital in June. 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 He was transferred to Rehab and was nearing discharge but then this am developed hematemesis vr hemoptysis. He has been on plavix and Lovenox. On daily baby ASA, no other NSAIDS. He has no associated abdominal pain. No nausea. Every time he coughs there is red blood. No throat or mouth pain. No chest pain. No dysphagia / odynophagia. He has had about 5 episodes of  bleeding this am.  VSS. No melena or hematochezia. Hgb is 10.8, it was 11.4 two days ago. No history of cirrhosis.   Previous GI Evaluation    Oct 2020 Flexible sigmoidoscopy for hematochezia - There was blood in the rectum and left colon.  Distal rectal ulcer was found and treated with cautery, clips were placed 2020 flexible sigmoidoscopy for hematochezia  Recent Labs and Imaging DG Ankle Complete Right  Result Date: 12/25/2021 CLINICAL DATA:  Soft tissue wound along the plantar aspect of the right foot with decreased healing, initial encounter EXAM: RIGHT ANKLE - COMPLETE 3+ VIEW COMPARISON:  10/06/2021 FINDINGS: Previously seen lucency within the distal tibia is no longer identified. Soft tissue wound is noted along the superior aspect of the calcaneus near the Achilles insertion. This is new from the prior exam. There are rows of changes in this region consistent with osteomyelitis. Tarsal degenerative changes are noted. IMPRESSION: New nonhealing wound along the posterior aspect of the ankle with erosive changes along the posterosuperior calcaneus consistent with osteomyelitis. Electronically Signed   By: Inez Catalina M.D.   On: 12/25/2021 19:38    Labs:  Recent Labs    01/14/22 1154 01/16/22 1427  WBC 11.5* 10.5  HGB 11.4* 10.8*  HCT 35.4* 34.6*  PLT 178 181   Recent Labs    01/14/22 1154 01/16/22 1427  NA 130* 133*  K 4.8 4.4  CL 87* 93*  CO2 23 26  GLUCOSE 118* 127*  BUN 53* 48*  CREATININE 7.43* 6.98*  CALCIUM 9.4 9.0   Recent Labs    01/16/22 1427  ALBUMIN 2.8*   No results for input(s): "HEPBSAG", "HCVAB", "HEPAIGM", "HEPBIGM" in the last 72 hours. No results for input(s): "LABPROT", "INR" in the last 72 hours.  Past Medical History:  Diagnosis Date   Anemia    Anemia of chronic disease    Anxiety    ARF (acute renal failure) ([place]) 01/30/2019   Calciphylaxis    Controlled type 2 diabetes mellitus with hyperglycemia, without long-term current use of insulin  ([place])    Debility 02/09/2019   Depression    Diabetes mellitus type 2 in obese ([place]) 07/04/2013   Diabetes mellitus with peripheral vascular disease ([place])    type 2 no meds, lost 100 lbs   Dyslipidemia    Dyspnea    inhaler   End stage renal disease ([place])    ESRD (end stage renal disease) on dialysis ([place]) 01/2019   MWFS   ESRD on dialysis [facility] [address])    Essential hypertension    Fluid overload 02/04/2019   GERD (gastroesophageal reflux disease)    Goals of care, counseling/discussion    Habitual alcohol use 07/04/2013   Headache    Hyperkalemia 01/30/2019   Hypertension    Hypoglycemia 01/30/2019   Hypokalemia 01/30/2019   Labile blood pressure    Leukocytosis    Lower GI bleed 05/14/2019   Macrocytic anemia 01/30/2019   Obesity, Class II, BMI 35-39.9, with comorbidity 07/04/2013   Palliative care encounter    PDR (proliferative diabetic retinopathy) ([place]) 12/14/2013   Physical deconditioning    Pressure injury of skin 04/27/2019   Pruritus    Scrotal edema    Scrotal pain    Sleep apnea    uses CPAP   Sleep disturbance    Slow transit constipation    Status post below-knee amputation of left lower extremity ([place])    Stroke ([place])    mini -shown on CT scan   Syphilis 01/2019   Urinary retention    Venous hypertension 09/22/2013   Wound infection 03/28/2019    Past Surgical History:  Procedure Laterality Date   ABDOMINAL AORTOGRAM W/LOWER EXTREMITY N/A 10/15/2021   Procedure: ABDOMINAL AORTOGRAM W/LOWER EXTREMITY;  Surgeon: Broadus , MD;  Location: [place] [facility];  Service: Cardiovascular;  Laterality: N/A;   AMPUTATION Right 01/02/2022   Procedure: RIGHT BELOW KNEE AMPUTATION;  Surgeon: [name], MD;  Location: [place];  Service: Orthopedics;  Laterality: Right;   AV FISTULA PLACEMENT Left 01/09/2019   Procedure: ARTERIOVENOUS (AV) FISTULA CREATION LEFT UPPER ARM;  Surgeon: [name], MD;  Location: [facility];  Service: Vascular;  Laterality: Left;   [place] Left 07/06/2019   Procedure: BASILIC VEIN TRANSPOSITION SECOND STAGE LEFT ARM;  Surgeon: [name], MD;  Location: [place];  Service: Vascular;  Laterality: Left;   below the knee amputation Left    EYE SURGERY Bilateral    FLEXIBLE SIGMOIDOSCOPY N/A 05/15/2019   Procedure: FLEXIBLE SIGMOIDOSCOPY;  Surgeon: Irving Copas., MD;  Location: [place];  Service: Gastroenterology;  Laterality: N/A;   HEMOSTASIS CLIP PLACEMENT  05/15/2019   Procedure: HEMOSTASIS CLIP PLACEMENT;  Surgeon: Irving Copas., MD;  Location: [facility];  Service: Gastroenterology;;   HOT HEMOSTASIS N/A 05/15/2019   Procedure: HOT HEMOSTASIS (ARGON PLASMA COAGULATION/BICAP);  Surgeon: Irving Copas., MD;  Location: [place];  Service: Gastroenterology;  Laterality: N/A;   I & D EXTREMITY Right 10/10/2021   Procedure: RIGHT ACHILLES DEBRIDEMENT AND TISSUE GRAFT;  Surgeon: [name], MD;  Location: [place];  Service: Orthopedics;  Laterality: Right;   IR FLUORO GUIDE CV LINE RIGHT  01/31/2019   IR FLUORO GUIDE CV LINE RIGHT  02/03/2019   IR THORACENTESIS ASP PLEURAL SPACE W/IMG GUIDE  11/08/2019   IR US GUIDE VASC ACCESS RIGHT  01/31/2019   IR US GUIDE VASC ACCESS RIGHT  02/03/2019   PERIPHERAL VASCULAR BALLOON ANGIOPLASTY  10/15/2021   Procedure: PERIPHERAL VASCULAR BALLOON ANGIOPLASTY;  Surgeon: Broadus , MD;  Location: [place] [facility];  Service: Cardiovascular;;  rt sfa and trifurcation   SCLEROTHERAPY  05/15/2019   Procedure: SCLEROTHERAPY;  Surgeon: Mansouraty, Telford Nab., MD;  Location: [facility] ENDOSCOPY;  Service: Gastroenterology;;    Family History  Problem Relation Age of Onset   Diabetes Mother    Multiple myeloma Mother     Prior to Admission medications   Medication Sig Start Date End Date Taking? Authorizing Provider  acetaminophen (TYLENOL) 325 MG tablet Take 1-2 tablets (325-650 mg total) by mouth every 4 (four) hours as needed for mild pain.  01/16/22   Setzer, Edman Circle, PA-C  acetaminophen (TYLENOL) 500 MG tablet Take 1,000 mg by mouth every 6 (six) hours as needed for mild pain.    [provider]  albuterol (VENTOLIN HFA) 108 (90 Base) MCG/ACT inhaler Inhale 2 puffs into the lungs every 6 (six) hours as needed for wheezing or shortness of breath.    [provider]  amitriptyline (ELAVIL) 10 MG tablet Take 1 tablet (10 mg total) by mouth at bedtime. 01/16/22   Setzer, Edman Circle, PA-C  ascorbic acid (VITAMIN C) 1000 MG tablet Take 1 tablet (1,000 mg total) by mouth daily. 01/28/2022   Setzer, Edman Circle, PA-C  aspirin EC 81 MG tablet Take 1 tablet (81 mg total) by mouth daily. Swallow whole. 01/16/22   Setzer, Edman Circle, PA-C  atorvastatin (LIPITOR) 40 MG tablet Take 1 tablet (40 mg total) by mouth at bedtime. 01/16/22   Setzer, Edman Circle, PA-C  bisacodyl (DULCOLAX) 5 MG EC tablet Take 1 tablet (5 mg total) by mouth daily as needed for moderate constipation. 01/06/22   Hosie Poisson, MD  bromocriptine (PARLODEL) 2.5 MG tablet Take 1 tablet (2.5 mg total) by mouth at bedtime. 01/16/22   Setzer, Edman Circle, PA-C  buPROPion (WELLBUTRIN XL) 300 MG 24 hr tablet Take 1 tablet (300 mg total) by mouth in the morning. 01/16/22   Setzer, Edman Circle, PA-C  clopidogrel (PLAVIX) 75 MG tablet Take 1 tablet (75 mg total) by mouth daily with breakfast. 01/16/22   Setzer, Edman Circle, PA-C  docusate sodium (COLACE) 100 MG capsule Take 1 capsule (100 mg total) by mouth daily. 01/06/22   Hosie Poisson, MD  DULoxetine (CYMBALTA) 20 MG capsule Take 20 mg by mouth 2 (two) times daily. 12/25/21   [provider]  DULoxetine (CYMBALTA) 60 MG capsule Take 1 capsule (60 mg total) by mouth daily. 02/11/2022   Setzer, Edman Circle, PA-C  ferric citrate (AURYXIA) 1 GM 210 MG(Fe) tablet Take 3 tablets (630 mg total) by mouth 3 (three) times daily with meals. 01/16/22   Setzer, Edman Circle, PA-C  gabapentin (NEURONTIN)  100 MG capsule Take 1 capsule (100 mg total) by mouth 3  (three) times daily. 01/16/22   Setzer, Edman Circle, PA-C  HYDROmorphone (DILAUDID) 4 MG tablet Take 4 mg by mouth every 8 (eight) hours as needed for moderate pain. 12/25/21   [provider]  HYDROmorphone (DILAUDID) 4 MG tablet Take 1 tablet (4 mg total) by mouth every 4 (four) hours as needed for severe pain. 01/16/22   Setzer, Edman Circle, PA-C  insulin aspart (NOVOLOG) 100 UNIT/ML injection CBG 70 - 120: 0 units  CBG 121 - 150: 0 units  CBG 151 - 200: 1 unit  CBG 201-250: 2 units  CBG 251-300: 3 units  CBG 301-350: 4 units  CBG 351-400: 5 units 01/06/22   Hosie Poisson, MD  ipratropium-albuterol (DUONEB) 0.5-2.5 (3) MG/3ML SOLN Take 3 mLs by nebulization every 6 (six) hours as needed (For shortness of breath).    [provider]  Melatonin 10 MG TABS Take 10 mg by mouth at bedtime. 01/16/22   Setzer, Edman Circle, PA-C  metoprolol tartrate (LOPRESSOR) 25 MG tablet Take 1 tablet (25 mg total) by mouth 2 (two) times daily. 10/16/21   Wells Guiles, DO  metoprolol tartrate (LOPRESSOR) 25 MG tablet Take 0.5 tablets (12.5 mg total) by mouth 2 (two) times daily. 01/16/22   Setzer, Edman Circle, PA-C  midodrine (PROAMATINE) 10 MG tablet Take 1 tablet (10 mg total) by mouth every Monday, Wednesday, and Friday. 01/16/22   Setzer, Edman Circle, PA-C  multivitamin (RENA-VIT) TABS tablet Take 1 tablet by mouth at bedtime. 01/16/22   Setzer, Edman Circle, PA-C  nutrition supplement, JUVEN, (JUVEN) PACK Take 1 packet by mouth 2 (two) times daily between meals. 01/06/22 03/07/22  Hosie Poisson, MD  ondansetron (ZOFRAN) 4 MG tablet Take 4 mg by mouth daily as needed for vomiting. 01/01/21   [provider]  pantoprazole (PROTONIX) 40 MG tablet Take 1 tablet (40 mg total) by mouth at bedtime. 01/16/22   Setzer, Edman Circle, PA-C  polyethylene glycol (MIRALAX / GLYCOLAX) 17 g packet Take 17 g by mouth daily as needed for mild constipation. 01/06/22   Hosie Poisson, MD  silver sulfADIAZINE (SILVADENE) 1 % cream Apply 1  application. topically daily. Apply to affected area daily plus dry dressing 12/11/21   [name], MD  sodium chloride 0.9 % SOLN 100 mL with sodium thiosulfate 250 MG/ML SOLN 25 g Inject 25 g into the vein every Monday, Wednesday, and Friday with hemodialysis. 01/07/22   Hosie Poisson, MD  tamsulosin (FLOMAX) 0.4 MG CAPS capsule Take 1 capsule (0.4 mg total) by mouth at bedtime. 01/16/22   Setzer, Edman Circle, PA-C  urea 10 % lotion Apply topically 2 (two) times daily. 01/06/22   Hosie Poisson, MD  vitamin C (ASCORBIC ACID) 500 MG tablet Take 500 mg by mouth at bedtime.    [provider]  zinc sulfate 220 (50 Zn) MG capsule Take 1 capsule (220 mg total) by mouth daily. 01/16/22   Setzer, Edman Circle, PA-C    Current Facility-Administered Medications  Medication Dose Route Frequency Provider Last Rate Last Admin   acetaminophen (TYLENOL) tablet 325-650 mg  325-650 mg Oral Q4H PRN Bary Leriche, PA-C   650 mg at 01/15/22 0412   albuterol (PROVENTIL) (2.5 MG/3ML) 0.083% nebulizer solution 3 mL  3 mL Inhalation Q6H PRN Love, Pamela S, PA-C       amitriptyline (ELAVIL) tablet 10 mg  10 mg Oral QHS Raulkar, Clide Deutscher, MD  10 mg at 01/16/22 2129   ascorbic acid (VITAMIN C) tablet 1,000 mg  1,000 mg Oral Daily Bary Leriche, PA-C   1,000 mg at 01/16/22 9604   aspirin EC tablet 81 mg  81 mg Oral Daily Bary Leriche, PA-C   81 mg at 01/16/22 5409   atorvastatin (LIPITOR) tablet 40 mg  40 mg Oral QHS Bary Leriche, PA-C   40 mg at 01/16/22 2127   bisacodyl (DULCOLAX) suppository 10 mg  10 mg Rectal Daily PRN Love, Pamela S, PA-C       bromocriptine (PARLODEL) tablet 2.5 mg  2.5 mg Oral QHS Love, Pamela S, PA-C   2.5 mg at 01/16/22 2129   buPROPion (WELLBUTRIN XL) 24 hr tablet 300 mg  300 mg Oral q AM LoveIvan Anchors, PA-C   300 mg at 02/06/2022 8119   Chlorhexidine Gluconate Cloth 2 % PADS 6 each  6 each Topical Q0600 Roney Jaffe, MD   6 each at 02/08/2022 0549   clopidogrel (PLAVIX) tablet 75 mg   75 mg Oral Daily Bary Leriche, PA-C   75 mg at 01/16/22 0930   diphenhydrAMINE (BENADRYL) 12.5 MG/5ML elixir 12.5-25 mg  12.5-25 mg Oral Q6H PRN Love, Pamela S, PA-C       DULoxetine (CYMBALTA) DR capsule 60 mg  60 mg Oral Daily Raulkar, Clide Deutscher, MD   60 mg at 01/16/22 0929   enoxaparin (LOVENOX) injection 30 mg  30 mg Subcutaneous Q24H Love, Pamela S, PA-C   30 mg at 01/16/22 2142   ferric citrate (AURYXIA) tablet 630 mg  630 mg Oral TID WC Bary Leriche, PA-C   630 mg at 01/16/22 1228   gabapentin (NEURONTIN) capsule 100 mg  100 mg Oral TID Izora Ribas, MD   100 mg at 01/16/22 2130   guaiFENesin-dextromethorphan (ROBITUSSIN DM) 100-10 MG/5ML syrup 15 mL  15 mL Oral Q4H PRN Love, Pamela S, PA-C       HYDROmorphone (DILAUDID) tablet 4 mg  4 mg Oral Q4H PRN Raulkar, Clide Deutscher, MD   4 mg at 01/26/2022 1478   hydrOXYzine (ATARAX) tablet 10 mg  10 mg Oral TID PRN Bary Leriche, PA-C       ipratropium-albuterol (DUONEB) 0.5-2.5 (3) MG/3ML nebulizer solution 3 mL  3 mL Nebulization Q6H PRN Love, Pamela S, PA-C       melatonin tablet 10 mg  10 mg Oral QHS Love, Pamela S, PA-C   10 mg at 01/16/22 2127   metoprolol tartrate (LOPRESSOR) tablet 12.5 mg  12.5 mg Oral BID Izora Ribas, MD   12.5 mg at 01/16/22 2128   midodrine (PROAMATINE) tablet 10 mg  10 mg Oral Q M,W,F Bary Leriche, PA-C   10 mg at 01/16/22 2956   milk and molasses enema  1 enema Rectal Daily PRN Love, Pamela S, PA-C       multivitamin (RENA-VIT) tablet 1 tablet  1 tablet Oral QHS Love, Pamela S, PA-C   1 tablet at 01/16/22 2129   nutrition supplement (JUVEN) (JUVEN) powder packet 1 packet  1 packet Oral BID BM Bary Leriche, PA-C   1 packet at 01/16/22 1135   pantoprazole (PROTONIX) EC tablet 40 mg  40 mg Oral Daily Bary Leriche, PA-C   40 mg at 01/16/22 0930   phenol (CHLORASEPTIC) mouth spray 1 spray  1 spray Mouth/Throat PRN Love, Pamela S, PA-C       polyethylene glycol (MIRALAX / GLYCOLAX)  packet 17 g  17 g Oral  Daily PRN Love, Pamela S, PA-C       prochlorperazine (COMPAZINE) tablet 5-10 mg  5-10 mg Oral Q6H PRN Bary Leriche, PA-C   10 mg at 01/11/22 1133   Or   prochlorperazine (COMPAZINE) injection 5-10 mg  5-10 mg Intramuscular Q6H PRN Love, Pamela S, PA-C       Or   prochlorperazine (COMPAZINE) suppository 12.5 mg  12.5 mg Rectal Q6H PRN Love, Pamela S, PA-C       sodium thiosulfate 25 g in sodium chloride 0.9 % 200 mL Infusion for Calciphylaxis  25 g Intravenous Q M,W,F-HD Love, Pamela S, PA-C 200 mL/hr at 01/14/22 1300 25 g at 01/14/22 1300   tamsulosin (FLOMAX) capsule 0.4 mg  0.4 mg Oral QHS Love, Pamela S, PA-C   0.4 mg at 01/16/22 2129   traZODone (DESYREL) tablet 25-50 mg  25-50 mg Oral QHS PRN Bary Leriche, PA-C   50 mg at 01/12/22 2148   urea 10 % lotion   Topical BID Bary Leriche, [place]   Given at 01/16/22 2145    Allergies as of 01/06/2022 - Review Complete 01/06/2022  Allergen Reaction Noted   Tape Itching 10/06/2021    Social History   Socioeconomic History   Marital status: Divorced    Spouse name: Not on file   Number of children: Not on file   Years of education: Not on file   Highest education level: Not on file  Occupational History   Not on file  Tobacco Use   Smoking status: Former    Types: Cigarettes    Start date: 11/18/2018    Quit date: 01/18/2019    Years since quitting: 3.0    Passive exposure: Never   Smokeless tobacco: Never   Tobacco comments:    pt stated got bored with it  Vaping Use   Vaping Use: Never used  Substance and Sexual Activity   Alcohol use: Not Currently    Comment: heavy drinker in the past, none since 07/19/18   Drug use: Not Currently   Sexual activity: Not Currently  Other Topics Concern   Not on file  Social History Narrative   Not on file   Social Determinants of Health   Financial Resource Strain: Not on file  Food Insecurity: Not on file  Transportation Needs: Not on file  Physical Activity: Not on file  Stress:  Not on file  Social Connections: Not on file  Intimate Partner Violence: Not on file    Review of Systems: All systems reviewed and negative except where noted in HPI.  Physical Exam: Vital signs in last 24 hours: Temp:  [98 F (36.7 C)-98.4 F (36.9 C)] 98.3 F (36.8 C) (07/01 0459) Pulse Rate:  [25-76] 76 (07/01 0805) Resp:  [11-21] 16 (07/01 0459) BP: (103-134)/(66-92) 122/66 (07/01 0805) SpO2:  [93 %-100 %] 100 % (07/01 0459) Weight:  [97 kg] 97 kg (06/30 1411) Last BM Date : 01/16/22  General:  Alert male in NAD Psych:  Pleasant, cooperative. Normal mood and affect Eyes: Pupils equal, no icterus. Conjunctive pink Ears:  Normal auditory acuity Nose: No deformity, discharge or lesions. No blood in nares Mouth: Red blood in mouth but no obvious source.  Neck:  Supple, no masses felt Lungs:  Clear to auscultation.  Heart:  Regular rate, regular rhythm.  Abdomen:  Soft, nondistended, nontender, active bowel sounds, no masses felt Rectal :  Hard greenish colored stool  in vault  Msk: Symmetrical without gross deformities. Bilateral lower extremity amputations Neurologic:  Alert, oriented, grossly normal neurologically Skin:  Intact without significant lesions.    Intake/Output from previous day: 06/30 0701 - 07/01 0700 In: 390 [P.O.:390] Out: 2300  Intake/Output this shift:  No intake/output data recorded.    Principal Problem:   Below-knee amputation of right lower extremity (HCC) Active Problems:   ESRD (end stage renal disease) ([place])   Status post below-knee amputation of left lower extremity ([place])    Tye Savoy, NP-C @  02/14/2022, 8:59 AM  GI ATTENDING  History, laboratories, x-rays personally reviewed.  Patient personally seen and examined.  Agree with comprehensive consultation as outlined above without additions or deletions.  Patient has been spitting up blood.  This appears to be oropharyngeal or possibly posterior nosebleed.  However, will be  important to perform upper endoscopy just to be certain that this is not bleeding from a primary GI source.  If so, we will address it accordingly.  If not, then ENT evaluation.  Patient is at high risk given his comorbidities and his currently taking Plavix.The nature of the procedure, as well as the risks, benefits, and alternatives were carefully and thoroughly reviewed with the patient. Ample time for discussion and questions allowed. The patient understood, was satisfied, and agreed to proceed.  Docia Chuck. Geri [place]., M.D. [facility] Division of Gastroenterology    ADDENDUM: With the passage of time, this patient's bleeding seems to be of pulmonary or ENT origin.  Low suspicion of GI origin.  His chest x-ray shows patchy airspace disease.  Hemoglobin stable.  Recommend the following: 1.  EGD has been canceled 2.  Urgent pulmonary and ENT evaluations We will notify primary service.  GI on standby  Monda Chastain N. Geri [place]., M.D. [facility] Division of Gastroenterology

## 2022-01-17 NOTE — Consult Note (Signed)
NAME:  Michael Riggs, MRN:  [id number], DOB:  1971-07-01, LOS: 0 ADMISSION DATE:  02/12/2022, CONSULTATION DATE:  02/11/2022 REFERRING MD:  Dr. Daryll Drown, CHIEF COMPLAINT:  hemoptysis    History of Present Illness:   51 yo M, PMH diabetes mellitus, complicated by ESRD iHD, severe PAD, HLD, HTN, anemia of chronic disease, calciphylaxis, history of stroke. He was on DAPT with ASA and plavix. He was in rehab. He work up this morning with blood in his teeth. He constantly coughing up sputum since this morning bringing up bright red blood. No clots.   Pertinent  Medical History   Past Medical History:  Diagnosis Date   Anemia    Anemia of chronic disease    Anxiety    ARF (acute renal failure) ([place]) 01/30/2019   Calciphylaxis    Controlled type 2 diabetes mellitus with hyperglycemia, without long-term current use of insulin ([place])    Debility 02/09/2019   Depression    Diabetes mellitus type 2 in obese ([place]) 07/04/2013   Diabetes mellitus with peripheral vascular disease ([place])    type 2 no meds, lost 100 lbs   Dyslipidemia    Dyspnea    inhaler   End stage renal disease ([place])    ESRD (end stage renal disease) on dialysis ([place]) 01/2019   MWFS   ESRD on dialysis [facility])    Essential hypertension    Fluid overload 02/04/2019   GERD (gastroesophageal reflux disease)    Goals of care, counseling/discussion    Habitual alcohol use 07/04/2013   Headache    Hyperkalemia 01/30/2019   Hypertension    Hypoglycemia 01/30/2019   Hypokalemia 01/30/2019   Labile blood pressure    Leukocytosis    Lower GI bleed 05/14/2019   Macrocytic anemia 01/30/2019   Obesity, Class II, BMI 35-39.9, with comorbidity 07/04/2013   Palliative care encounter    PDR (proliferative diabetic retinopathy) ([place]) 12/14/2013   Physical deconditioning    Pressure injury of skin 04/27/2019   Pruritus    Scrotal edema    Scrotal pain    Sleep apnea    uses CPAP   Sleep disturbance    Slow transit constipation     Status post below-knee amputation of left lower extremity ([place])    Stroke ([place])    mini -shown on CT scan   Syphilis 01/2019   Urinary retention    Venous hypertension 09/22/2013   Wound infection 03/28/2019     Significant Hospital Events: Including procedures, antibiotic start and stop dates in addition to other pertinent events     Interim History / Subjective:   Coughing up blood   Objective   There were no vitals taken for this visit.       No intake or output data in the 24 hours ending 02/02/2022 1632 There were no vitals filed for this visit.  Examination: General: obese, chronically ill appearing male HENT: ncat, tracking  Lungs: anterior rhonchi  Cardiovascular: RRR, s1 s2  Abdomen: soft, nt nd  Extremities: no edema  Neuro: alert following commands, BKA bilateral LE  GU: deferred   CXR 01/17/2022:  Bilateral multifocal infiltrates  The patient's images have been independently reviewed by me.    Resolved Hospital Problem list     Assessment & Plan:   Hemoptysis  Bilateral Airspace disease on CXR  Multifocal infiltrates  Dual Antiplatelet therapy ESRD on iHD, Anemia of Chronic Disease  P: CT chest  Hold plavix  TXA 564mg nebulized X1  May  need to repeat again if bleeding continues  CT to rule an endobronchial pathology  If continues to bleed will likely need airway inspection with bronchoscopy    Labs   CBC: Recent Labs  Lab 01/14/22 1154 01/16/22 1427 01/24/2022 0839 02/05/2022 1244 02/16/2022 1544  WBC 11.5* 10.5 9.5 16.9* 20.2*  NEUTROABS  --   --  7.3  --   --   HGB 11.4* 10.8* 11.1* 10.3* 9.7*  HCT 35.4* 34.6* 36.3* 31.6* 30.8*  MCV 104.7* 107.1* 107.1* 106.4* 107.3*  PLT 178 181 190 201 465    Basic Metabolic Panel: Recent Labs  Lab 01/12/22 0628 01/14/22 1154 01/16/22 1427 02/12/2022 0839  NA 131* 130* 133* 133*  K 5.2* 4.8 4.4 4.0  CL 85* 87* 93* 92*  CO2 '26 23 26 28  ' GLUCOSE 83 118* 127* 84  BUN 64* 53* 48* 29*  CREATININE  8.35* 7.43* 6.98* 5.39*  CALCIUM 10.0 9.4 9.0 9.1  PHOS 5.8* 4.4 3.9  --    GFR: Estimated Creatinine Clearance: 17.4 mL/min (A) (by C-G formula based on SCr of 5.39 mg/dL (H)). Recent Labs  Lab 01/16/22 1427 01/23/2022 0839 02/04/2022 1244 02/05/2022 1544  WBC 10.5 9.5 16.9* 20.2*    Liver Function Tests: Recent Labs  Lab 01/12/22 0628 01/14/22 1154 01/16/22 1427 01/22/2022 0839  AST  --   --   --  19  ALT  --   --   --  13  ALKPHOS  --   --   --  116  BILITOT  --   --   --  1.1  PROT  --   --   --  6.9  ALBUMIN 2.9* 2.8* 2.8* 2.8*   No results for input(s): "LIPASE", "AMYLASE" in the last 168 hours. No results for input(s): "AMMONIA" in the last 168 hours.  ABG    Component Value Date/Time   PHART 7.388 04/13/2019 1205   PCO2ART 35.7 04/13/2019 1205   PO2ART 55.2 (L) 04/13/2019 1205   HCO3 29.4 (H) 10/14/2021 1159   TCO2 26 10/10/2021 0837   ACIDBASEDEF 3.1 (H) 04/13/2019 1205   O2SAT 24.7 10/14/2021 1159     Coagulation Profile: No results for input(s): "INR", "PROTIME" in the last 168 hours.  Cardiac Enzymes: No results for input(s): "CKTOTAL", "CKMB", "CKMBINDEX", "TROPONINI" in the last 168 hours.  HbA1C: Hgb A1c MFr Bld  Date/Time Value Ref Range Status  10/08/2021 01:30 AM 6.2 (H) 4.8 - 5.6 % Final    Comment:    (NOTE) Pre diabetes:          5.7%-6.4%  Diabetes:              >6.4%  Glycemic control for   <7.0% adults with diabetes   05/16/2019 06:27 AM 5.3 4.8 - 5.6 % Final    Comment:    (NOTE) Pre diabetes:          5.7%-6.4% Diabetes:              >6.4% Glycemic control for   <7.0% adults with diabetes     CBG: Recent Labs  Lab 01/14/22 0551 01/14/22 1149 01/15/22 0559 01/15/22 2051 01/16/22 0559  GLUCAP 103* 108* 72 107* 109*    Review of Systems:    Review of Systems  Constitutional:  Negative for chills, fever, malaise/fatigue and weight loss.  HENT:  Negative for hearing loss, sore throat and tinnitus.   Eyes:  Negative  for blurred vision and double vision.  Respiratory:  Positive for cough, hemoptysis and shortness of breath. Negative for sputum production, wheezing and stridor.   Cardiovascular:  Negative for chest pain, palpitations, orthopnea, leg swelling and PND.  Gastrointestinal:  Negative for abdominal pain, constipation, diarrhea, heartburn, nausea and vomiting.  Genitourinary:  Negative for dysuria, hematuria and urgency.  Musculoskeletal:  Negative for joint pain and myalgias.  Skin:  Negative for itching and rash.  Neurological:  Negative for dizziness, tingling, weakness and headaches.  Endo/Heme/Allergies:  Negative for environmental allergies. Does not bruise/bleed easily.  Psychiatric/Behavioral:  Negative for depression. The patient is not nervous/anxious and does not have insomnia.   All other systems reviewed and are negative.    Past Medical History:  He,  has a past medical history of Anemia, Anemia of chronic disease, Anxiety, ARF (acute renal failure) ([place]) (01/30/2019), Calciphylaxis, Controlled type 2 diabetes mellitus with hyperglycemia, without long-term current use of insulin ([place]), Debility (02/09/2019), Depression, Diabetes mellitus type 2 in obese ([place]) (07/04/2013), Diabetes mellitus with peripheral vascular disease ([place]), Dyslipidemia, Dyspnea, End stage renal disease ([place]), ESRD (end stage renal disease) on dialysis ([place]) (01/2019), ESRD on dialysis [facility]), Essential hypertension, Fluid overload (02/04/2019), GERD (gastroesophageal reflux disease), Goals of care, counseling/discussion, Habitual alcohol use (07/04/2013), Headache, Hyperkalemia (01/30/2019), Hypertension, Hypoglycemia (01/30/2019), Hypokalemia (01/30/2019), Labile blood pressure, Leukocytosis, Lower GI bleed (05/14/2019), Macrocytic anemia (01/30/2019), Obesity, Class II, BMI 35-39.9, with comorbidity (07/04/2013), Palliative care encounter, PDR (proliferative diabetic retinopathy) ([place]) (12/14/2013), Physical deconditioning,  Pressure injury of skin (04/27/2019), Pruritus, Scrotal edema, Scrotal pain, Sleep apnea, Sleep disturbance, Slow transit constipation, Status post below-knee amputation of left lower extremity ([place]), Stroke ([place]), Syphilis (01/2019), Urinary retention, Venous hypertension (09/22/2013), and Wound infection (03/28/2019).    Surgical History:   Past Surgical History:  Procedure Laterality Date   ABDOMINAL AORTOGRAM W/LOWER EXTREMITY N/A 10/15/2021   Procedure: ABDOMINAL AORTOGRAM W/LOWER EXTREMITY;  Surgeon: Broadus John, MD;  Location: [facility];  Service: Cardiovascular;  Laterality: N/A;   AMPUTATION Right 01/02/2022   Procedure: RIGHT BELOW KNEE AMPUTATION;  Surgeon: [name], MD;  Location: [facility];  Service: Orthopedics;  Laterality: Right;   AV FISTULA PLACEMENT Left 01/09/2019   Procedure: ARTERIOVENOUS (AV) FISTULA CREATION LEFT UPPER ARM;  Surgeon: [name], MD;  Location: [facility];  Service: Vascular;  Laterality: Left;   [place] Left 07/06/2019   Procedure: BASILIC VEIN TRANSPOSITION SECOND STAGE LEFT ARM;  Surgeon: [name], MD;  Location: [facility];  Service: Vascular;  Laterality: Left;   below the knee amputation Left    EYE SURGERY Bilateral    FLEXIBLE SIGMOIDOSCOPY N/A 05/15/2019   Procedure: FLEXIBLE SIGMOIDOSCOPY;  Surgeon: Irving Copas., MD;  Location: [facility];  Service: Gastroenterology;  Laterality: N/A;   HEMOSTASIS CLIP PLACEMENT  05/15/2019   Procedure: HEMOSTASIS CLIP PLACEMENT;  Surgeon: Irving Copas., MD;  Location: [facility];  Service: Gastroenterology;;   HOT HEMOSTASIS N/A 05/15/2019   Procedure: HOT HEMOSTASIS (ARGON PLASMA COAGULATION/BICAP);  Surgeon: Irving Copas., MD;  Location: [facility];  Service: Gastroenterology;  Laterality: N/A;   I & D EXTREMITY Right 10/10/2021   Procedure: RIGHT ACHILLES DEBRIDEMENT AND TISSUE GRAFT;  Surgeon: [name], MD;  Location: [facility];  Service:  Orthopedics;  Laterality: Right;   IR FLUORO GUIDE CV LINE RIGHT  01/31/2019   IR FLUORO GUIDE CV LINE RIGHT  02/03/2019   IR THORACENTESIS ASP PLEURAL SPACE W/IMG GUIDE  11/08/2019   IR US GUIDE VASC ACCESS RIGHT  01/31/2019   IR US GUIDE VASC ACCESS RIGHT  02/03/2019   PERIPHERAL VASCULAR BALLOON ANGIOPLASTY  10/15/2021   Procedure: PERIPHERAL VASCULAR BALLOON ANGIOPLASTY;  Surgeon: Broadus John, MD;  Location: [facility];  Service: Cardiovascular;;  rt sfa and trifurcation   SCLEROTHERAPY  05/15/2019   Procedure: SCLEROTHERAPY;  Surgeon: Mansouraty, Telford Nab., MD;  Location: [facility];  Service: Gastroenterology;;     Social History:   reports that he quit smoking about 3 years ago. His smoking use included cigarettes. He started smoking about 3 years ago. He has never been exposed to tobacco smoke. He has never used smokeless tobacco. He reports that he does not currently use alcohol. He reports that he does not currently use drugs.   Family History:  His family history includes Diabetes in his mother; Multiple myeloma in his mother.   Allergies Allergies  Allergen Reactions   Tape Itching    Prefers silk tape over regular tape     Home Medications  Prior to Admission medications   Medication Sig Start Date End Date Taking? Authorizing Provider  acetaminophen (TYLENOL) 325 MG tablet Take 1-2 tablets (325-650 mg total) by mouth every 4 (four) hours as needed for mild pain. 01/16/22   Setzer, Edman Circle, PA-C  amitriptyline (ELAVIL) 10 MG tablet Take 1 tablet (10 mg total) by mouth at bedtime. 01/16/22   Setzer, Edman Circle, PA-C  ascorbic acid (VITAMIN C) 1000 MG tablet Take 1 tablet (1,000 mg total) by mouth daily. 01/29/2022   Setzer, Edman Circle, PA-C  aspirin EC 81 MG tablet Take 1 tablet (81 mg total) by mouth daily. Swallow whole. 01/16/22   Setzer, Edman Circle, PA-C  atorvastatin (LIPITOR) 40 MG tablet Take 1 tablet (40 mg total) by mouth at bedtime. 01/16/22   Setzer, Edman Circle,  PA-C  bisacodyl (DULCOLAX) 5 MG EC tablet Take 1 tablet (5 mg total) by mouth daily as needed for moderate constipation. 01/06/22   Hosie Poisson, MD  bromocriptine (PARLODEL) 2.5 MG tablet Take 1 tablet (2.5 mg total) by mouth at bedtime. 01/16/22   Setzer, Edman Circle, PA-C  buPROPion (WELLBUTRIN XL) 300 MG 24 hr tablet Take 1 tablet (300 mg total) by mouth in the morning. 01/16/22   Setzer, Edman Circle, PA-C  clopidogrel (PLAVIX) 75 MG tablet Take 1 tablet (75 mg total) by mouth daily with breakfast. 01/16/22   Setzer, Edman Circle, PA-C  docusate sodium (COLACE) 100 MG capsule Take 1 capsule (100 mg total) by mouth daily. 01/06/22   Hosie Poisson, MD  DULoxetine (CYMBALTA) 60 MG capsule Take 1 capsule (60 mg total) by mouth daily. 02/14/2022   Setzer, Edman Circle, PA-C  ferric citrate (AURYXIA) 1 GM 210 MG(Fe) tablet Take 3 tablets (630 mg total) by mouth 3 (three) times daily with meals. 01/16/22   Setzer, Edman Circle, PA-C  gabapentin (NEURONTIN) 100 MG capsule Take 1 capsule (100 mg total) by mouth 3 (three) times daily. 01/16/22   Setzer, Edman Circle, PA-C  HYDROmorphone (DILAUDID) 4 MG tablet Take 1 tablet (4 mg total) by mouth every 4 (four) hours as needed for severe pain. 01/16/22   Setzer, Edman Circle, PA-C  Melatonin 10 MG TABS Take 10 mg by mouth at bedtime. 01/16/22   Setzer, Edman Circle, PA-C  metoprolol tartrate (LOPRESSOR) 25 MG tablet Take 0.5 tablets (12.5 mg total) by mouth 2 (two) times daily. 01/16/22   Setzer, Edman Circle, PA-C  midodrine (PROAMATINE) 10 MG tablet Take 1 tablet (10 mg  total) by mouth every Monday, Wednesday, and Friday. 01/16/22   Setzer, Edman Circle, PA-C  multivitamin (RENA-VIT) TABS tablet Take 1 tablet by mouth at bedtime. 01/16/22   Setzer, Edman Circle, PA-C  pantoprazole (PROTONIX) 40 MG tablet Take 1 tablet (40 mg total) by mouth at bedtime. 01/16/22   Setzer, Edman Circle, PA-C  polyethylene glycol (MIRALAX / GLYCOLAX) 17 g packet Take 17 g by mouth daily as needed for mild constipation. 01/06/22   Hosie Poisson, MD  silver sulfADIAZINE (SILVADENE) 1 % cream Apply 1 application. topically daily. Apply to affected area daily plus dry dressing 12/11/21   [name], MD  sodium chloride 0.9 % SOLN 100 mL with sodium thiosulfate 250 MG/ML SOLN 25 g Inject 25 g into the vein every Monday, Wednesday, and Friday with hemodialysis. 01/07/22   Hosie Poisson, MD  tamsulosin (FLOMAX) 0.4 MG CAPS capsule Take 1 capsule (0.4 mg total) by mouth at bedtime. 01/16/22   Setzer, Edman Circle, PA-C  urea 10 % lotion Apply topically 2 (two) times daily. 01/06/22   Hosie Poisson, MD  zinc sulfate 220 (50 Zn) MG capsule Take 1 capsule (220 mg total) by mouth daily. 01/16/22   Setzer, Edman Circle, PA-C      [facility] 01/29/2022 4:32 PM

## 2022-01-17 NOTE — Progress Notes (Signed)
Pharmacy Antibiotic Note  Michael Riggs is a 51 y.o. male admitted on 02/04/2022 with pneumonia.  Pharmacy has been consulted for cefepime dosing.  Pt is ESRD on HD MWF  Plan: Cefepime 1g IV q 24 hrs.     Temp (24hrs), Avg:98.5 F (36.9 C), Min:98 F (36.7 C), Max:99.3 F (37.4 C)  Recent Labs  Lab 01/12/22 0628 01/14/22 1154 01/16/22 1427 02/05/2022 0839 01/27/2022 1244  WBC  --  11.5* 10.5 9.5 16.9*  CREATININE 8.35* 7.43* 6.98* 5.39*  --     Estimated Creatinine Clearance: 17.4 mL/min (A) (by C-G formula based on SCr of 5.39 mg/dL (H)).    Allergies  Allergen Reactions   Tape Itching    Prefers silk tape over regular tape    Thank you for allowing pharmacy to be a part of this patient's care.  [place] Crane, Roylene Reason, BCCP Clinical Pharmacist  02/06/2022 3:48 PM   [facility] pharmacy phone numbers are listed on [email or web address]

## 2022-01-18 ENCOUNTER — Inpatient Hospital Stay (HOSPITAL_COMMUNITY): Payer: Medicare Other

## 2022-01-18 DIAGNOSIS — N186 End stage renal disease: Secondary | ICD-10-CM

## 2022-01-18 DIAGNOSIS — E875 Hyperkalemia: Secondary | ICD-10-CM

## 2022-01-18 DIAGNOSIS — I472 Ventricular tachycardia, unspecified: Secondary | ICD-10-CM

## 2022-01-18 DIAGNOSIS — J9602 Acute respiratory failure with hypercapnia: Secondary | ICD-10-CM

## 2022-01-18 DIAGNOSIS — R579 Shock, unspecified: Secondary | ICD-10-CM

## 2022-01-18 DIAGNOSIS — I469 Cardiac arrest, cause unspecified: Secondary | ICD-10-CM

## 2022-01-18 DIAGNOSIS — Z992 Dependence on renal dialysis: Secondary | ICD-10-CM

## 2022-01-18 DIAGNOSIS — J9601 Acute respiratory failure with hypoxia: Secondary | ICD-10-CM

## 2022-01-18 DIAGNOSIS — R042 Hemoptysis: Secondary | ICD-10-CM | POA: Diagnosis not present

## 2022-01-18 LAB — BASIC METABOLIC PANEL
Anion gap: 20 — ABNORMAL HIGH (ref 5–15)
Anion gap: 27 — ABNORMAL HIGH (ref 5–15)
BUN: 53 mg/dL — ABNORMAL HIGH (ref 6–20)
BUN: 77 mg/dL — ABNORMAL HIGH (ref 6–20)
CO2: 21 mmol/L — ABNORMAL LOW (ref 22–32)
CO2: 15 mmol/L — ABNORMAL LOW (ref 22–32)
Calcium: 8.8 mg/dL — ABNORMAL LOW (ref 8.9–10.3)
Calcium: 10.2 mg/dL (ref 8.9–10.3)
Chloride: 92 mmol/L — ABNORMAL LOW (ref 98–111)
Chloride: 91 mmol/L — ABNORMAL LOW (ref 98–111)
Creatinine, Ser: 6.67 mg/dL — ABNORMAL HIGH (ref 0.61–1.24)
Creatinine, Ser: 7.69 mg/dL — ABNORMAL HIGH (ref 0.61–1.24)
GFR, Estimated: 9 mL/min — ABNORMAL LOW (ref >60)
GFR, Estimated: 8 mL/min — ABNORMAL LOW (ref >60)
Glucose, Bld: 146 mg/dL — ABNORMAL HIGH (ref 70–99)
Glucose, Bld: 178 mg/dL — ABNORMAL HIGH (ref 70–99)
Potassium: 6.4 mmol/L (ref 3.5–5.1)
Potassium: 6.3 mmol/L (ref 3.5–5.1)
Sodium: 133 mmol/L — ABNORMAL LOW (ref 135–145)
Sodium: 133 mmol/L — ABNORMAL LOW (ref 135–145)

## 2022-01-18 LAB — POCT I-STAT 7, (LYTES, BLD GAS, ICA,H+H)
Acid-base deficit: 4 mmol/L — ABNORMAL HIGH (ref 0.0–2.0)
Acid-base deficit: 2 mmol/L (ref 0.0–2.0)
Acid-base deficit: 1 mmol/L (ref 0.0–2.0)
Bicarbonate: 20.1 mmol/L (ref 20.0–28.0)
Bicarbonate: 23.2 mmol/L (ref 20.0–28.0)
Bicarbonate: 24.7 mmol/L (ref 20.0–28.0)
Calcium, Ion: 1.07 mmol/L — ABNORMAL LOW (ref 1.15–1.40)
Calcium, Ion: 1.09 mmol/L — ABNORMAL LOW (ref 1.15–1.40)
Calcium, Ion: 1.12 mmol/L — ABNORMAL LOW (ref 1.15–1.40)
HCT: 29 % — ABNORMAL LOW (ref 39.0–52.0)
HCT: 24 % — ABNORMAL LOW (ref 39.0–52.0)
HCT: 27 % — ABNORMAL LOW (ref 39.0–52.0)
Hemoglobin: 9.9 g/dL — ABNORMAL LOW (ref 13.0–17.0)
Hemoglobin: 8.2 g/dL — ABNORMAL LOW (ref 13.0–17.0)
Hemoglobin: 9.2 g/dL — ABNORMAL LOW (ref 13.0–17.0)
O2 Saturation: 100 %
O2 Saturation: 100 %
O2 Saturation: 100 %
Patient temperature: 98.6
Patient temperature: 98.6
Potassium: 5.9 mmol/L — ABNORMAL HIGH (ref 3.5–5.1)
Potassium: 6.5 mmol/L (ref 3.5–5.1)
Potassium: 6.2 mmol/L — ABNORMAL HIGH (ref 3.5–5.1)
Sodium: 131 mmol/L — ABNORMAL LOW (ref 135–145)
Sodium: 132 mmol/L — ABNORMAL LOW (ref 135–145)
Sodium: 132 mmol/L — ABNORMAL LOW (ref 135–145)
TCO2: 21 mmol/L — ABNORMAL LOW (ref 22–32)
TCO2: 24 mmol/L (ref 22–32)
TCO2: 26 mmol/L (ref 22–32)
pCO2 arterial: 30.8 mmHg — ABNORMAL LOW (ref 32–48)
pCO2 arterial: 42.4 mmHg (ref 32–48)
pCO2 arterial: 43.7 mmHg (ref 32–48)
pH, Arterial: 7.423 (ref 7.35–7.45)
pH, Arterial: 7.346 — ABNORMAL LOW (ref 7.35–7.45)
pH, Arterial: 7.36 (ref 7.35–7.45)
pO2, Arterial: 302 mmHg — ABNORMAL HIGH (ref 83–108)
pO2, Arterial: 362 mmHg — ABNORMAL HIGH (ref 83–108)
pO2, Arterial: 442 mmHg — ABNORMAL HIGH (ref 83–108)

## 2022-01-18 LAB — CBC
HCT: 26.9 % — ABNORMAL LOW (ref 39.0–52.0)
HCT: 22.2 % — ABNORMAL LOW (ref 39.0–52.0)
HCT: 24.6 % — ABNORMAL LOW (ref 39.0–52.0)
Hemoglobin: 8.3 g/dL — ABNORMAL LOW (ref 13.0–17.0)
Hemoglobin: 6.6 g/dL — CL (ref 13.0–17.0)
Hemoglobin: 7.4 g/dL — ABNORMAL LOW (ref 13.0–17.0)
MCH: 33.7 pg (ref 26.0–34.0)
MCH: 35.1 pg — ABNORMAL HIGH (ref 26.0–34.0)
MCH: 33.9 pg (ref 26.0–34.0)
MCHC: 30.9 g/dL (ref 30.0–36.0)
MCHC: 29.7 g/dL — ABNORMAL LOW (ref 30.0–36.0)
MCHC: 30.1 g/dL (ref 30.0–36.0)
MCV: 109.3 fL — ABNORMAL HIGH (ref 80.0–100.0)
MCV: 118.1 fL — ABNORMAL HIGH (ref 80.0–100.0)
MCV: 112.8 fL — ABNORMAL HIGH (ref 80.0–100.0)
Platelets: 257 10*3/uL (ref 150–400)
Platelets: 176 10*3/uL (ref 150–400)
Platelets: 299 10*3/uL (ref 150–400)
RBC: 2.46 MIL/uL — ABNORMAL LOW (ref 4.22–5.81)
RBC: 1.88 MIL/uL — ABNORMAL LOW (ref 4.22–5.81)
RBC: 2.18 MIL/uL — ABNORMAL LOW (ref 4.22–5.81)
RDW: 15 % (ref 11.5–15.5)
RDW: 15.7 % — ABNORMAL HIGH (ref 11.5–15.5)
RDW: 15.2 % (ref 11.5–15.5)
WBC: 27.3 10*3/uL — ABNORMAL HIGH (ref 4.0–10.5)
WBC: 26.7 10*3/uL — ABNORMAL HIGH (ref 4.0–10.5)
WBC: 42.4 10*3/uL — ABNORMAL HIGH (ref 4.0–10.5)
nRBC: 0 % (ref 0.0–0.2)
nRBC: 0.5 % — ABNORMAL HIGH (ref 0.0–0.2)
nRBC: 1.2 % — ABNORMAL HIGH (ref 0.0–0.2)

## 2022-01-18 LAB — TROPONIN I (HIGH SENSITIVITY)
Troponin I (High Sensitivity): 7403 ng/L (ref <18)
Troponin I (High Sensitivity): >24000 ng/L (ref <18)

## 2022-01-18 LAB — LACTIC ACID, PLASMA: Lactic Acid, Venous: >9 mmol/L (ref 0.5–1.9)

## 2022-01-18 LAB — GLUCOSE, CAPILLARY
Glucose-Capillary: 115 mg/dL — ABNORMAL HIGH (ref 70–99)
Glucose-Capillary: 154 mg/dL — ABNORMAL HIGH (ref 70–99)
Glucose-Capillary: 186 mg/dL — ABNORMAL HIGH (ref 70–99)

## 2022-01-18 LAB — MRSA NEXT GEN BY PCR, NASAL: MRSA by PCR Next Gen: NOT DETECTED

## 2022-01-18 LAB — AMMONIA: Ammonia: 18 umol/L (ref 9–35)

## 2022-01-18 LAB — PROCALCITONIN: Procalcitonin: 0.69 ng/mL

## 2022-01-18 LAB — MAGNESIUM: Magnesium: 3.4 mg/dL — ABNORMAL HIGH (ref 1.7–2.4)

## 2022-01-18 LAB — T4, FREE: Free T4: 0.99 ng/dL (ref 0.61–1.12)

## 2022-01-18 MED ORDER — FENTANYL BOLUS VIA INFUSION: 50.0000 ug | INTRAVENOUS | Status: DC | PRN

## 2022-01-18 MED ORDER — SODIUM ZIRCONIUM CYCLOSILICATE 10 G PO PACK: 10.0000 g | PACK | Freq: Once | ORAL | Status: DC

## 2022-01-18 MED ORDER — ETOMIDATE 2 MG/ML IV SOLN
INTRAVENOUS | Status: AC
  Administered 2022-01-18: 20 mg via INTRAVENOUS
  Filled 2022-01-18: qty 20

## 2022-01-18 MED ORDER — VASOPRESSIN 20 UNITS/100 ML INFUSION FOR SHOCK
INTRAVENOUS | Status: AC
  Filled 2022-01-18: qty 100

## 2022-01-18 MED ORDER — SODIUM CHLORIDE 0.9 % IV SOLN
250.0000 mL | INTRAVENOUS | Status: DC
  Administered 2022-01-18: 250 mL via INTRAVENOUS

## 2022-01-18 MED ORDER — SODIUM ZIRCONIUM CYCLOSILICATE 10 G PO PACK
10.0000 g | PACK | Freq: Once | ORAL | Status: DC
Start: 2022-01-18 — End: 2022-01-18

## 2022-01-18 MED ORDER — FENTANYL 2500MCG IN NS 250ML (10MCG/ML) PREMIX INFUSION
0.0000 ug/h | INTRAVENOUS | Status: DC
  Administered 2022-01-18: 400 ug/h via INTRAVENOUS

## 2022-01-18 MED ORDER — LACTATED RINGERS IV BOLUS
500.0000 mL | Freq: Once | INTRAVENOUS | Status: AC
  Administered 2022-01-18: 500 mL via INTRAVENOUS

## 2022-01-18 MED ORDER — CALCIUM GLUCONATE 10 % IV SOLN: 1.0000 g | Freq: Once | INTRAVENOUS | Status: DC

## 2022-01-18 MED ORDER — DEXTROSE 50 % IV SOLN
1.0000 | Freq: Once | INTRAVENOUS | Status: AC
Start: 2022-01-18 — End: 2022-01-18
  Administered 2022-01-18: 50 mL via INTRAVENOUS

## 2022-01-18 MED ORDER — CALCIUM GLUCONATE 10 % IV SOLN
1.0000 g | Freq: Once | INTRAVENOUS | Status: AC
  Administered 2022-01-18: 1 g via INTRAVENOUS

## 2022-01-18 MED ORDER — ACETAMINOPHEN 650 MG RE SUPP
650.0000 mg | RECTAL | Status: DC
  Administered 2022-01-18: 650 mg via RECTAL
  Filled 2022-01-18: qty 1

## 2022-01-18 MED ORDER — SODIUM CHLORIDE 0.9 % IV SOLN
0.5000 ug/min | INTRAVENOUS | Status: DC
  Filled 2022-01-18: qty 10

## 2022-01-18 MED ORDER — PRISMASOL BGK 0/2.5 32-2.5 MEQ/L EC SOLN
Status: DC
  Filled 2022-01-18 (×6): qty 5000

## 2022-01-18 MED ORDER — SODIUM CHLORIDE 0.9 % IV SOLN: INTRAVENOUS | Status: DC

## 2022-01-18 MED ORDER — GLYCOPYRROLATE 0.2 MG/ML IJ SOLN: 0.2000 mg | INTRAMUSCULAR | Status: DC | PRN

## 2022-01-18 MED ORDER — EPINEPHRINE HCL 5 MG/250ML IV SOLN IN NS
0.5000 ug/min | INTRAVENOUS | Status: DC
  Administered 2022-01-18: 10 ug/min via INTRAVENOUS

## 2022-01-18 MED ORDER — VANCOMYCIN HCL IN DEXTROSE 1-5 GM/200ML-% IV SOLN
1000.0000 mg | INTRAVENOUS | Status: DC
Start: 2022-01-19 — End: 2022-01-18

## 2022-01-18 MED ORDER — SODIUM CHLORIDE 0.9% IV SOLUTION: Freq: Once | INTRAVENOUS | Status: DC

## 2022-01-18 MED ORDER — NOREPINEPHRINE 16 MG/250ML-% IV SOLN: 0.0000 ug/min | INTRAVENOUS | Status: DC

## 2022-01-18 MED ORDER — INSULIN ASPART 100 UNIT/ML IV SOLN
10.0000 [IU] | Freq: Once | INTRAVENOUS | Status: DC
  Administered 2022-01-18: 10 [IU] via INTRAVENOUS

## 2022-01-18 MED ORDER — ETOMIDATE 2 MG/ML IV SOLN: 20.0000 mg | Freq: Once | INTRAVENOUS | Status: AC

## 2022-01-18 MED ORDER — HEPARIN SODIUM (PORCINE) 1000 UNIT/ML DIALYSIS
1000.0000 [IU] | INTRAMUSCULAR | Status: DC | PRN
  Filled 2022-01-18: qty 6

## 2022-01-18 MED ORDER — SODIUM BICARBONATE 8.4 % IV SOLN
50.0000 meq | Freq: Once | INTRAVENOUS | Status: AC
  Administered 2022-01-18: 50 meq via INTRAVENOUS
  Filled 2022-01-18: qty 50

## 2022-01-18 MED ORDER — SODIUM CHLORIDE 0.9 % IV SOLN: 250.0000 mL | INTRAVENOUS | Status: DC

## 2022-01-18 MED ORDER — NOREPINEPHRINE 4 MG/250ML-% IV SOLN: 2.0000 ug/min | INTRAVENOUS | Status: DC

## 2022-01-18 MED ORDER — BUSPIRONE HCL 15 MG PO TABS: 30.0000 mg | ORAL_TABLET | Freq: Three times a day (TID) | ORAL | Status: DC | PRN

## 2022-01-18 MED ORDER — FENTANYL CITRATE (PF) 100 MCG/2ML IJ SOLN: 50.0000 ug | INTRAMUSCULAR | Status: DC | PRN

## 2022-01-18 MED ORDER — VANCOMYCIN HCL 2000 MG/400ML IV SOLN
2000.0000 mg | Freq: Once | INTRAVENOUS | Status: AC
  Administered 2022-01-18: 2000 mg via INTRAVENOUS
  Filled 2022-01-18: qty 400

## 2022-01-18 MED ORDER — NOREPINEPHRINE 4 MG/250ML-% IV SOLN
INTRAVENOUS | Status: AC
  Filled 2022-01-18: qty 250

## 2022-01-18 MED ORDER — ROCURONIUM BROMIDE 10 MG/ML (PF) SYRINGE
PREFILLED_SYRINGE | INTRAVENOUS | Status: AC
  Administered 2022-01-18: 100 mg via INTRAVENOUS
  Filled 2022-01-18: qty 10

## 2022-01-18 MED ORDER — VANCOMYCIN HCL 1250 MG/250ML IV SOLN: 1250.0000 mg | INTRAVENOUS | Status: DC

## 2022-01-18 MED ORDER — PANTOPRAZOLE SODIUM 40 MG IV SOLR
40.0000 mg | Freq: Two times a day (BID) | INTRAVENOUS | Status: DC
  Filled 2022-01-18: qty 10

## 2022-01-18 MED ORDER — DEXTROSE 50 % IV SOLN
25.0000 mL | Freq: Once | INTRAVENOUS | Status: AC
  Filled 2022-01-18: qty 50

## 2022-01-18 MED ORDER — FENTANYL 2500MCG IN NS 250ML (10MCG/ML) PREMIX INFUSION
50.0000 ug/h | INTRAVENOUS | Status: DC
  Filled 2022-01-18: qty 250

## 2022-01-18 MED ORDER — SODIUM ZIRCONIUM CYCLOSILICATE 10 G PO PACK
10.0000 g | PACK | Freq: Once | ORAL | Status: AC
  Administered 2022-01-18: 10 g via ORAL
  Filled 2022-01-18: qty 1

## 2022-01-18 MED ORDER — ACETAMINOPHEN 650 MG RE SUPP: 650.0000 mg | Freq: Four times a day (QID) | RECTAL | Status: DC | PRN

## 2022-01-18 MED ORDER — EPINEPHRINE HCL 5 MG/250ML IV SOLN IN NS
INTRAVENOUS | Status: AC
  Filled 2022-01-18: qty 250

## 2022-01-18 MED ORDER — CHLORHEXIDINE GLUCONATE CLOTH 2 % EX PADS: 6.0000 | MEDICATED_PAD | Freq: Every day | CUTANEOUS | Status: DC

## 2022-01-18 MED ORDER — ACETAMINOPHEN 650 MG RE SUPP: 650.0000 mg | RECTAL | Status: DC | PRN

## 2022-01-18 MED ORDER — ACETAMINOPHEN 160 MG/5ML PO SOLN: 650.0000 mg | ORAL | Status: DC

## 2022-01-18 MED ORDER — PRISMASOL BGK 0/2.5 32-2.5 MEQ/L EC SOLN
Status: DC
  Filled 2022-01-18 (×2): qty 5000

## 2022-01-18 MED ORDER — DOCUSATE SODIUM 50 MG/5ML PO LIQD: 100.0000 mg | Freq: Two times a day (BID) | ORAL | Status: DC

## 2022-01-18 MED ORDER — SODIUM CHLORIDE 0.9 % FOR CRRT: INTRAVENOUS_CENTRAL | Status: DC | PRN

## 2022-01-18 MED ORDER — MIDODRINE HCL 5 MG PO TABS
10.0000 mg | ORAL_TABLET | ORAL | Status: DC
Start: 2022-01-19 — End: 2022-01-18

## 2022-01-18 MED ORDER — ACETAMINOPHEN 160 MG/5ML PO SOLN: 650.0000 mg | ORAL | Status: DC | PRN

## 2022-01-18 MED ORDER — AMIODARONE LOAD VIA INFUSION
150.0000 mg | Freq: Once | INTRAVENOUS | Status: AC
  Administered 2022-01-18: 150 mg via INTRAVENOUS
  Filled 2022-01-18: qty 83.34

## 2022-01-18 MED ORDER — POLYETHYLENE GLYCOL 3350 17 G PO PACK
17.0000 g | PACK | Freq: Every day | ORAL | Status: DC
Start: 2022-01-18 — End: 2022-01-18

## 2022-01-18 MED ORDER — GLYCOPYRROLATE 1 MG PO TABS: 1.0000 mg | ORAL_TABLET | ORAL | Status: DC | PRN

## 2022-01-18 MED ORDER — INSULIN ASPART 100 UNIT/ML IV SOLN
10.0000 [IU] | Freq: Once | INTRAVENOUS | Status: AC
Start: 2022-01-18 — End: 2022-01-18
  Administered 2022-01-18: 10 [IU] via INTRAVENOUS

## 2022-01-18 MED ORDER — VASOPRESSIN 20 UNITS/100 ML INFUSION FOR SHOCK
0.0000 [IU]/min | INTRAVENOUS | Status: DC
  Administered 2022-01-18: 0.03 [IU]/min via INTRAVENOUS
  Filled 2022-01-18: qty 100

## 2022-01-18 MED ORDER — DEXTROSE 50 % IV SOLN
1.0000 | Freq: Once | INTRAVENOUS | Status: DC
  Administered 2022-01-18: 50 mL via INTRAVENOUS

## 2022-01-18 MED ORDER — ACETAMINOPHEN 325 MG PO TABS: 650.0000 mg | ORAL_TABLET | ORAL | Status: DC

## 2022-01-18 MED ORDER — VANCOMYCIN HCL 2000 MG/400ML IV SOLN
2000.0000 mg | Freq: Once | INTRAVENOUS | Status: DC
  Filled 2022-01-18: qty 400

## 2022-01-18 MED ORDER — ONDANSETRON HCL 4 MG/2ML IJ SOLN: 4.0000 mg | Freq: Four times a day (QID) | INTRAMUSCULAR | Status: DC | PRN

## 2022-01-18 MED ORDER — AMIODARONE HCL IN DEXTROSE 360-4.14 MG/200ML-% IV SOLN
60.0000 mg/h | INTRAVENOUS | Status: AC
Start: 2022-01-18 — End: 2022-01-18
  Administered 2022-01-18 (×2): 60 mg/h via INTRAVENOUS
  Filled 2022-01-18 (×2): qty 200

## 2022-01-18 MED ORDER — ATORVASTATIN CALCIUM 40 MG PO TABS
40.0000 mg | ORAL_TABLET | Freq: Every day | ORAL | Status: DC
Start: 2022-01-18 — End: 2022-01-18

## 2022-01-18 MED ORDER — PANTOPRAZOLE 2 MG/ML SUSPENSION: 40.0000 mg | Freq: Every day | ORAL | Status: DC

## 2022-01-18 MED ORDER — SODIUM CHLORIDE 0.9 % IV SOLN
2.0000 g | Freq: Two times a day (BID) | INTRAVENOUS | Status: DC
  Administered 2022-01-18: 2 g via INTRAVENOUS
  Filled 2022-01-18: qty 12.5

## 2022-01-18 MED ORDER — ALTEPLASE 2 MG IJ SOLR: 2.0000 mg | Freq: Once | INTRAMUSCULAR | Status: DC | PRN

## 2022-01-18 MED ORDER — FENTANYL CITRATE (PF) 100 MCG/2ML IJ SOLN: 50.0000 ug | Freq: Once | INTRAMUSCULAR | Status: DC

## 2022-01-18 MED ORDER — INSULIN ASPART 100 UNIT/ML IV SOLN
10.0000 [IU] | Freq: Once | INTRAVENOUS | Status: DC
Start: 2022-01-18 — End: 2022-01-18

## 2022-01-18 MED ORDER — ACETAMINOPHEN 325 MG PO TABS: 650.0000 mg | ORAL_TABLET | ORAL | Status: DC | PRN

## 2022-01-18 MED ORDER — POLYVINYL ALCOHOL 1.4 % OP SOLN: 1.0000 [drp] | Freq: Four times a day (QID) | OPHTHALMIC | Status: DC | PRN

## 2022-01-18 MED ORDER — NOREPINEPHRINE 4 MG/250ML-% IV SOLN
2.0000 ug/min | INTRAVENOUS | Status: DC
  Administered 2022-01-18: 5 ug/min via INTRAVENOUS

## 2022-01-18 MED ORDER — PROPOFOL 1000 MG/100ML IV EMUL: 0.0000 ug/kg/min | INTRAVENOUS | Status: DC

## 2022-01-18 MED ORDER — NOREPINEPHRINE 4 MG/250ML-% IV SOLN
0.0000 ug/min | INTRAVENOUS | Status: DC
  Administered 2022-01-18: 36 ug/min via INTRAVENOUS
  Administered 2022-01-18: 40 ug/min via INTRAVENOUS
  Administered 2022-01-18: 30 ug/min via INTRAVENOUS
  Filled 2022-01-18 (×3): qty 250

## 2022-01-18 MED ORDER — ACETAMINOPHEN 325 MG PO TABS: 650.0000 mg | ORAL_TABLET | Freq: Four times a day (QID) | ORAL | Status: DC | PRN

## 2022-01-18 MED ORDER — MAGNESIUM SULFATE 2 GM/50ML IV SOLN: 2.0000 g | Freq: Once | INTRAVENOUS | Status: DC | PRN

## 2022-01-18 MED ORDER — FENTANYL BOLUS VIA INFUSION: 100.0000 ug | INTRAVENOUS | Status: DC | PRN

## 2022-01-18 MED ORDER — CALCIUM GLUCONATE-NACL 1-0.675 GM/50ML-% IV SOLN
1.0000 g | Freq: Once | INTRAVENOUS | Status: AC
  Administered 2022-01-18: 1000 mg via INTRAVENOUS
  Filled 2022-01-18: qty 50

## 2022-01-18 MED ORDER — POLYETHYLENE GLYCOL 3350 17 G PO PACK: 17.0000 g | PACK | Freq: Every day | ORAL | Status: DC

## 2022-01-18 MED ORDER — HYDROCORTISONE SOD SUC (PF) 100 MG IJ SOLR
100.0000 mg | Freq: Two times a day (BID) | INTRAMUSCULAR | Status: DC
  Filled 2022-01-18 (×2): qty 2

## 2022-01-18 MED ORDER — ROCURONIUM BROMIDE 10 MG/ML (PF) SYRINGE: 100.0000 mg | PREFILLED_SYRINGE | Freq: Once | INTRAVENOUS | Status: AC

## 2022-01-18 MED ORDER — AMIODARONE HCL IN DEXTROSE 360-4.14 MG/200ML-% IV SOLN
30.0000 mg/h | INTRAVENOUS | Status: DC
Start: 2022-01-18 — End: 2022-01-18

## 2022-01-18 MED ORDER — SODIUM BICARBONATE 8.4 % IV SOLN: 50.0000 meq | Freq: Once | INTRAVENOUS | Status: AC

## 2022-01-19 LAB — T3, FREE: T3, Free: 1.7 pg/mL — ABNORMAL LOW (ref 2.0–4.4)

## 2022-01-20 LAB — BASIC METABOLIC PANEL
Anion gap: 28 — ABNORMAL HIGH (ref 5–15)
BUN: 51 mg/dL — ABNORMAL HIGH (ref 6–20)
CO2: 13 mmol/L — ABNORMAL LOW (ref 22–32)
Calcium: 8.2 mg/dL — ABNORMAL LOW (ref 8.9–10.3)
Chloride: 77 mmol/L — ABNORMAL LOW (ref 98–111)
Creatinine, Ser: 6.26 mg/dL — ABNORMAL HIGH (ref 0.61–1.24)
GFR, Estimated: 10 mL/min — ABNORMAL LOW (ref >60)
Glucose, Bld: 873 mg/dL (ref 70–99)
Potassium: 3.9 mmol/L (ref 3.5–5.1)
Sodium: 118 mmol/L — CL (ref 135–145)

## 2022-01-23 LAB — CULTURE, BLOOD (ROUTINE X 2)
Culture: NO GROWTH
Culture: NO GROWTH

## 2022-02-16 MED FILL — Medication: Qty: 2 | Status: AC

## 2022-02-17 NOTE — Progress Notes (Signed)
   01/20/2022 1046  Clinical Encounter Type  Visited With Family;Health care provider  Visit Type Death;Patient actively dying  Referral From Nurse  Consult/Referral To Chaplain Melvenia Beam)  Spiritual Encounters  Spiritual Needs Emotional;Grief support   Paged to meet with the family of Michael Riggs following their consultation with patient's physician regarding care planning. After meeting with patient's physician [name]'s family has decided to transition him to [place]. Present were patient's mother, brother and sister-in-law. Speaking with patient's brother, Loistine Simas, [place] learned that their father had just died yesterday, and he was now placing their mother into hospice care with advances stages of dementia. Chaplain provided meaningful presence, and remained with patient's through extubation, and end of life. Chaplain assisted family with acquiring keepsake hand-prints of patient. Chaplain provided family with patient placement contact card and instructions for contacting patient representative.  Chaplain Resident Melvenia Beam, Ivin Poot., [phone]

## 2022-02-17 NOTE — Progress Notes (Signed)
An USGPIV (ultrasound guided PIV) has been placed for short-term vasopressor infusion. A correctly placed ivWatch must be used when administering Vasopressors. Should this treatment be needed beyond 72 hours, central line access should be obtained.  It will be the responsibility of the bedside nurse to follow best practice to prevent extravasations.   ?

## 2022-02-17 NOTE — Progress Notes (Signed)
PCCM interval note:  51 year old patient became hypotensive, was started on IV Levophed, he went into PEA cardiac arrest, CPR was initiated per ACLS protocol, please see separate CPR. note ROSC was achieved several times but patient continued to lose pulse after 3 sessions of CPR on different times, ROSC was achieved, ABG was drawn showed hyperkalemia of 6.5 despite multiple times treatment with calcium gluconate, bicarbonate, D50 and insulin, nephrology was consulted, will place HD catheter and patient will be started on CRRT  Tried calling patient's family, unable to reach patient's sister and brothers   Additional critical care time: 32 minutes  Performed by: Jacky Kindle   Critical care time was exclusive of separately billable procedures and treating other patients.   Critical care was necessary to treat or prevent imminent or life-threatening deterioration.   Critical care was time spent personally by me on the following activities: development of treatment plan with patient and/or surrogate as well as nursing, discussions with consultants, evaluation of patient's response to treatment, examination of patient, obtaining history from patient or surrogate, ordering and performing treatments and interventions, ordering and review of laboratory studies, ordering and review of radiographic studies, pulse oximetry and re-evaluation of patient's condition.   Jacky Kindle, MD [facility] See Amion for pager If no response to pager, please call [phone] until 7pm After 7pm, Please call E-link [phone]

## 2022-02-17 NOTE — Progress Notes (Signed)
   02-04-22 0934  Clinical Encounter Type  Visited With Patient not available;Health care provider  Visit Type Code;Critical Care (Code Blue)  Referral From Nurse  Consult/Referral To Chaplain Melvenia Beam)  Recommendations Code Blue Response   Page for [employer]. Upon arrival medical staff were performing resuscitation efforts. No Family present at this time. Family had been notified per nurse. [address] [place], Ivin Poot., [phone]

## 2022-02-17 NOTE — Progress Notes (Signed)
I was called to the bedside as patient was having agonal breathing on BiPAP, he was unresponsive and had frequent beats of V. tach.   Upon arrival patient was noted to be obtunded, not responding to sternal rub, he was on BiPAP.  Telemetry monitoring showed frequent beats of V. tach, decision was to proceed with endotracheal intubation and mechanical ventilation After intubation patient's rhythm improved sinus rhythm We will continue with amiodarone bolus followed by amiodarone infusion He was noted to have serum potassium of 6.4 with serum bicarbonate of 21, his baseline is around 27 He received calcium gluconate, 2 A of bicarbonate, D50 and insulin along with Kayexalate  Patient will need dialysis session in the morning   Additional critical care time: 31 minutes  Performed by: [place] care time was exclusive of separately billable procedures and treating other patients.   Critical care was necessary to treat or prevent imminent or life-threatening deterioration.   Critical care was time spent personally by me on the following activities: development of treatment plan with patient and/or surrogate as well as nursing, discussions with consultants, evaluation of patient's response to treatment, examination of patient, obtaining history from patient or surrogate, ordering and performing treatments and interventions, ordering and review of laboratory studies, ordering and review of radiographic studies, pulse oximetry and re-evaluation of patient's condition.   Jacky Kindle, MD [facility] See Amion for pager If no response to pager, please call [phone] until 7pm After 7pm, Please call E-link [phone]

## 2022-02-17 NOTE — Significant Event (Signed)
Rapid Response Event Note   Reason for Call : AMS, agonal breathing after possible seizure Initial Focused Assessment:  Notified by nursing staff that Mr. Duckett had some myoclonic seizure like activity and then unresponsiveness with agonal breathing. RT at bedside and assisting ventilations with BVM. Mr. Claw began to spontaneously breathe with moderate abdominal accessory muscle use and purposeful movement. Dr. Myna Hidalgo was notified and came to bedside. CBG 154. Pt placed on NRB mask at 15L and ABG obtained. PCCM consulted for tx to ICU and possible intubation. Pt was transported to CT and then to 3M04. Upon arrival to ICU, pt had a moment of myoclonic movement and unresponsiveness with VT on the monitor. Pt did not lose a pulse but was obtunded and agonally breathing. Dr. Tacy Learn was notified and came to bedside to intubate the patient.   0100-HR 108 ST, 84/66(71), RR 16 with sats 98% on NRB mask at 15L.    Interventions:  -NRB mask -ABG -Stat CT Head -tx 3M04        MD Notified: Dr. Myna Hidalgo and Dr. Tacy Learn at bedside.  Call Time: 0030 Arrival Time: 0034 End Time: 0230  Madelynn Done, RN

## 2022-02-17 NOTE — Procedures (Signed)
Arterial Catheter Insertion Procedure Note  Kashon Kraynak  [id number]  [birth year]  Date:January 21, 2022  Time:9:52 AM    Provider Performing: Candee Furbish    Procedure: Insertion of Arterial Line [phone]) with US guidance ([id number])   Indication(s) Blood pressure monitoring and/or need for frequent ABGs  Consent Unable to obtain consent due to emergent nature of procedure.  Anesthesia None   Time Out Verified patient identification, verified procedure, site/side was marked, verified correct patient position, special equipment/implants available, medications/allergies/relevant history reviewed, required imaging and test results available.   Sterile Technique Maximal sterile technique was not able to be achieved due to emergent nature of procedure.   Procedure Description Area of catheter insertion was cleaned with chlorhexidine and draped in sterile fashion. With real-time ultrasound guidance an arterial catheter was placed into the right femoral artery.  Appropriate arterial tracings confirmed on monitor.     Complications/Tolerance None; patient tolerated the procedure well.   EBL Minimal   Specimen(s) None

## 2022-02-17 NOTE — Code Documentation (Signed)
  Patient Name: Michael Riggs   MRN: [id number]   Date of Birth/ Sex: 07/28/1970 , male      Admission Date: 02/13/2022  Attending Provider: Sid Falcon, MD  Primary Diagnosis: Hemoptysis   Indication: Pt was in his usual state of health until this AM, when he was noted to be unresponsive and PEA arrest initially. Code blue was subsequently called. At the time of arrival on scene, ACLS protocol was underway.   Technical Description:  - CPR performance duration:  7 minutes  - Was defibrillation or cardioversion used? Yes   - Was external pacer placed? Yes  - Was patient intubated pre/post CPR? Yes   Medications Administered: Y = Yes; Blank = No Amiodarone  Y  Atropine    Calcium  Y  Epinephrine  Y  Lidocaine    Magnesium    Norepinephrine    Phenylephrine    Sodium bicarbonate  Y  Vasopressin     Post CPR evaluation:  - Final Status - Was patient successfully resuscitated ? Yes - What is current rhythm? Sinus Tachycardia  - What is current hemodynamic status? Hemodynamically unstable- hypotensive and tachycardic   Miscellaneous Information:  - Labs sent, including: CBC, BMP, Lactate, Troponin, ABG  - Primary team notified?  Yes  - Family Notified? Unable to reach family   - Additional notes/ transfer status: Patient to remain in ICU     Leigh Aurora, DO 02-Feb-2022, 4:18 AM

## 2022-02-17 NOTE — Progress Notes (Signed)
eLink Physician-Brief Progress Note Patient Name: Michael Riggs DOB: 10/16/1970 MRN: [id number]   Date of Service  01-24-2022  HPI/Events of Note  Pt transferred to the ICU, arrived with agonal breathing on bipap. Monitors showing that pt was intermittently going into a WCT rhythm.  Pacer pads placed, but AICD had delivered a shock when WCT was sustained.   eICU Interventions  Ground team notified, Dr. Tacy Learn arrived at the bedside and intubated the patient.  Placed orders to address hyperkalemia.  Amiodarone started as well.         Karolyn Messing M DELA CRUZ 01-24-2022, 2:14 AM

## 2022-02-17 NOTE — Progress Notes (Signed)
Checked to see if it was a good time for EEG, per RN Hilbert Bible, now is NOT a good time and she will call us when it is a good time for EEG placement.

## 2022-02-17 NOTE — Progress Notes (Addendum)
PHARMACY NOTE:  ANTIMICROBIAL RENAL DOSAGE ADJUSTMENT  Current antimicrobial regimen includes a mismatch between antimicrobial dosage and estimated renal function.  As per policy approved by the Pharmacy & Therapeutics and Medical Executive Committees, the antimicrobial dosage will be adjusted accordingly.  Current antimicrobial dosage:  Vancomycin 2000mg  x1 (not given yet) and vancomycin 1000mg  with iHD MWF. Cefepime 1g q24h   Indication: sepsis  Renal Function:  Estimated Creatinine Clearance: 15 mL/min (A) (by C-G formula based on SCr of 6.26 mg/dL (H)). []      On intermittent HD, scheduled: [x]      Starting CRRT    Antimicrobial dosage has been changed to:  Cefepime 2g q12h and vancomycin 1250mg  q24h   Additional comments: vancomycin loading dose retimined for RN to give    Thank you for allowing pharmacy to be a part of this patient's care.  Cristela Felt, PharmD, BCPS Clinical Pharmacist January 22, 2022 7:13 AM

## 2022-02-17 NOTE — IPAL (Signed)
  Interdisciplinary Goals of Care Family Meeting   Date carried out: Feb 10, 2022  Location of the meeting: Unit  Member's involved: Physician, Bedside Registered Nurse, Chaplain, and Family Member or next of kin  Durable Power of Attorney or acting medical decision maker: Brother    Discussion: We discussed goals of care for Michael Riggs including inevitable cardiac arrest.  All in agreement he would not want further resuscitative effort.  Code status: Full DNR  Disposition: In-patient comfort care  Time spent for the meeting: N/A    Michael Furbish, MD  02-10-22, 11:23 AM

## 2022-02-17 NOTE — Progress Notes (Signed)
VAST RN X2 responded to Code Blue. Pt with appropriate IV access; no more access needed at this time.

## 2022-02-17 NOTE — Progress Notes (Signed)
eLink Physician-Brief Progress Note Patient Name: Michael Riggs DOB: [birth year] MRN: [id number]   Date of Service  02-08-2022  HPI/Events of Note  Pt reported to have become bradycardic, then suddenly lost pulses.  ACLS immediately started, with ROSC obtained after 6 minutes.  PT received 2 doses of epinephrine, as well as bicarb and calcium as K came back at 6.5 on an ABG that was drawn just prior to the event.  Pt was also defibrillated during the course of the code.   eICU Interventions  Amiodarone restarted.  Will give another round of hyperkalemia medications: calcium, insulin+glucose, lokelma.  Nephrology eval for possible HD once more stable.         Decker Cogdell M DELA CRUZ 02/08/22, 4:15 AM

## 2022-02-17 NOTE — Progress Notes (Signed)
Hemodynamic deterioration throughout AM. Went into PEA arrest. Now back in myoclonus. Pouring blood out of OGT and ETT. Blood ordered, family has come in, I have told them that this is nonsurvivable and recommend allowing Michael Riggs to pass in peace given his previously stated wishes, they are processing.  Additional 60 min cc time independent of procedures  Erskine Emery MD PCCM

## 2022-02-17 NOTE — Progress Notes (Signed)
Pharmacy Antibiotic Note  Michael Riggs is a 51 y.o. male admitted on 02/04/2022 with sepsis.  Pharmacy has been consulted for Vancomycin dosing. Already on Cefepime. WBC increasing. ESRD on HD MWF.  Plan: Vancomycin 2000 mg IV x 1, then 1000 mg IV qHD MWF >>>Will also need supplemental dose if he needs any extra HD sessions Trend WBC, temp, HD schedule Already on Cefepime F/U infectious work-up Drug levels as indicated  Temp (24hrs), Avg:98.8 F (37.1 C), Min:98.3 F (36.8 C), Max:99.5 F (37.5 C)  Recent Labs  Lab 01/14/22 1154 01/16/22 1427 02/10/2022 0839 01/22/2022 1244 01/25/2022 1544 02/09/2022 2032 02/06/2022 2033 February 14, 2022 0126  WBC 11.5* 10.5 9.5 16.9* 20.2* 27.1*  --  27.3*  CREATININE 7.43* 6.98* 5.39*  --   --   --  6.27* 6.67*    Estimated Creatinine Clearance: 14.1 mL/min (A) (by C-G formula based on SCr of 6.67 mg/dL (H)).    Allergies  Allergen Reactions   Tape Itching    Prefers silk tape over regular tape    Narda Bonds, PharmD, BCPS Clinical Pharmacist Phone: [phone]

## 2022-02-17 NOTE — Progress Notes (Signed)
Chart accessed in order to review notes for Safety portal submission, as per protocol.

## 2022-02-17 NOTE — Procedures (Signed)
Central Venous Catheter Insertion Procedure Note  Michael Riggs  [id number]  May 28, 1971  Date:2022/02/03  Time:5:54 AM   Provider Performing:Jameson Morrow D Rollene Rotunda   Procedure: Insertion of Non-tunneled Central Venous Catheter(36556)with US guidance (35456)       Indication(s) Medication administration and Hemodialysis  Consent Risks of the procedure as well as the alternatives and risks of each were explained to the patient and/or caregiver.  Consent for the procedure was obtained and is signed in the bedside chart  Anesthesia Topical only with 1% lidocaine   Timeout Verified patient identification, verified procedure, site/side was marked, verified correct patient position, special equipment/implants available, medications/allergies/relevant history reviewed, required imaging and test results available.  Sterile Technique Maximal sterile technique including full sterile barrier drape, hand hygiene, sterile gown, sterile gloves, mask, hair covering, sterile ultrasound probe cover (if used).  Procedure Description Area of catheter insertion was cleaned with chlorhexidine and draped in sterile fashion.   With real-time ultrasound guidance a HD catheter was placed into the left internal jugular vein.  Nonpulsatile blood flow and easy flushing noted in all ports.  The catheter was sutured in place and sterile dressing applied.  Complications/Tolerance None; patient tolerated the procedure well. Chest X-ray is ordered to verify placement for internal jugular or subclavian cannulation.  Chest x-ray is not ordered for femoral cannulation.  EBL Minimal  Specimen(s) None  JD Rexene Agent [facility] 02-03-2022, 5:55 AM  Please see [email or web address] for pager details.  From 7A-7P if no response, please call [phone]. After hours, please call ELink [phone].

## 2022-02-17 NOTE — Progress Notes (Signed)
Patient asystole on monitor with flat arterial wave. This RN and [name], RN listened to the patient and pronounced cardiac time of death at 36. Dr. Tamala Julian notified. Family at bedside at this time with no needs.

## 2022-02-17 NOTE — Death Summary Note (Signed)
DEATH SUMMARY   Patient Details  Name: Michael Riggs MRN: [id number] DOB: 1971-02-10  Admission/Discharge Information   Admit Date:  01/27/2022  Date of Death: Date of Death: 01/28/22  Time of Death: Time of Death: 1157/10/13  Length of Stay: 1  Referring Physician: [name], MD     Diagnoses  In hospital cardiac arrest DM2 HTN ESRD on HD OSA Prior stroke Severe PVD s/p BL Dasher Hospital Course (including significant findings, care, treatment, and services provided and events leading to death)  Orvan Papadakis is a 51 y.o. year old male who presented with likely hemoptysis, admitted for further evaluation but unfortunately suffered cardiac arrest shortly after admission. Despite aggressive care including mechanical ventilation, recurrent rounds of chest compressions he passed away.  Pertinent Labs and Studies  Significant Diagnostic Studies DG CHEST PORT 1 VIEW  Result Date: Jan 28, 2022 CLINICAL DATA:  Encounter for central line placement. EXAM: PORTABLE CHEST 1 VIEW COMPARISON:  Portable chest earlier today at 2:39 a.m. FINDINGS: 6:13 a.m. ETT tip is 5.8 cm from the carina. NGT is well inside the stomach but neither the side-hole or tip of the tube are filmed. There are partially lucent overlying electrical pads on the left. There is interval new insertion of a left IJ double-lumen catheter with the tip in the SVC at the azygous confluence. No pneumothorax. Cardiomegaly with normal caliber central vasculature, stable mediastinal silhouette. Small layering left pleural effusion continues to be noted with dense left lower lobe consolidation. The remaining lungs are clear.  Thoracic spondylosis. IMPRESSION: 1. Interval double-lumen left IJ catheter insertion with tip at the azygous confluence in the SVC. 2. No interval change in the lung fields.  Cardiomegaly. 3. Stable small left pleural effusion. Electronically Signed   By: Telford Nab M.D.   On: 01/28/22 06:50    DG CHEST PORT 1 VIEW  Result Date: 01/28/2022 CLINICAL DATA:  Check endotracheal tube placement EXAM: PORTABLE CHEST 1 VIEW COMPARISON:  Film from earlier in the same day. FINDINGS: Cardiac shadow is stable. Endotracheal tube and gastric catheter are noted in satisfactory position. Improved aeration in the left base is noted. No bony abnormality is seen. IMPRESSION: Tubes and lines in satisfactory position. Improved aeration is noted in the left base. Electronically Signed   By: Inez Catalina M.D.   On: 01/28/22 02:59   DG Abd Portable 1V  Result Date: 01/28/2022 CLINICAL DATA:  Check gastric catheter placement EXAM: PORTABLE ABDOMEN - 1 VIEW COMPARISON:  None Available. FINDINGS: Gastric catheter extends into the mid stomach. No free air is seen. No other focal abnormality is noted. IMPRESSION: Gastric catheter within the stomach. Electronically Signed   By: Inez Catalina M.D.   On: 01-28-22 02:59   CT HEAD WO CONTRAST (5MM)  Result Date: January 28, 2022 CLINICAL DATA:  Altered mental status and recent seizure activity EXAM: CT HEAD WITHOUT CONTRAST TECHNIQUE: Contiguous axial images were obtained from the base of the skull through the vertex without intravenous contrast. RADIATION DOSE REDUCTION: This exam was performed according to the departmental dose-optimization program which includes automated exposure control, adjustment of the mA and/or kV according to patient size and/or use of iterative reconstruction technique. COMPARISON:  10/06/2021 FINDINGS: Brain: No evidence of acute infarction, hemorrhage, hydrocephalus, extra-axial collection or mass lesion/mass effect. Mild atrophic changes are noted. Lacunar infarcts in the basal ganglia are again seen Vascular: No hyperdense vessel or unexpected calcification. Skull: Normal. Negative for fracture or focal lesion. Sinuses/Orbits: No acute finding. Other:  None. IMPRESSION: Chronic atrophic and ischemic changes without acute abnormality. Electronically  Signed   By: Inez Catalina M.D.   On: 2022-01-31 02:06   DG CHEST PORT 1 VIEW  Result Date: Jan 31, 2022 CLINICAL DATA:  Acute respiratory failure EXAM: PORTABLE CHEST 1 VIEW COMPARISON:  02/12/2022 FINDINGS: Cardiac shadow is stable. Persistent left-sided effusion/empyema is noted. No new focal infiltrate is seen. IMPRESSION: Stable opacification in the left base similar to that seen on recent CT. Electronically Signed   By: Inez Catalina M.D.   On: Jan 31, 2022 01:24   CT CHEST WO CONTRAST  Result Date: 01/26/2022 CLINICAL DATA:  Hemoptysis EXAM: CT CHEST WITHOUT CONTRAST TECHNIQUE: Multidetector CT imaging of the chest was performed following the standard protocol without IV contrast. RADIATION DOSE REDUCTION: This exam was performed according to the departmental dose-optimization program which includes automated exposure control, adjustment of the mA and/or kV according to patient size and/or use of iterative reconstruction technique. COMPARISON:  Chest film from earlier in the same day, CT from 11/06/2019 FINDINGS: Cardiovascular: Limited due to lack of IV contrast. Aortic atherosclerotic calcifications are noted. Heavy coronary calcifications are seen. Mild cardiac enlargement is noted. Pulmonary artery is not significantly enlarged. Mediastinum/Nodes: Thoracic inlet is unremarkable. Scattered small mediastinal lymph nodes are noted likely reactive in nature given their stability from previous CT. The esophagus as visualized is within normal limits. Lungs/Pleura: Left-sided pleural effusion is noted with hyperdense rind likely representing an empyema. Associated left lower lobe consolidation is noted. These changes are relatively stable from the prior exam with the exception of the formation of the hyperdense rind. No focal nodules are seen. Mild basilar atelectasis on the right is seen. Upper Abdomen: Cholelithiasis without complicating factors. The remainder of the upper abdomen is within normal limits.  Musculoskeletal: Degenerative changes of the thoracic spine are noted. No acute bony abnormality is seen. IMPRESSION: Persistent left-sided pleural effusion with hyperdense rind likely representing an empyema. Associated lower lobe consolidation is noted on the left. Right basilar atelectasis. Stable cholelithiasis. Stable cardiomegaly and coronary calcifications. Aortic Atherosclerosis (ICD10-I70.0). Electronically Signed   By: Inez Catalina M.D.   On: 02/15/2022 20:01   CT SOFT TISSUE NECK WO CONTRAST  Result Date: 01/28/2022 CLINICAL DATA:  Hemoptysis. Occult malignancy. EXAM: CT NECK WITHOUT CONTRAST TECHNIQUE: Multidetector CT imaging of the neck was performed following the standard protocol without intravenous contrast. RADIATION DOSE REDUCTION: This exam was performed according to the departmental dose-optimization program which includes automated exposure control, adjustment of the mA and/or kV according to patient size and/or use of iterative reconstruction technique. COMPARISON:  None Available. FINDINGS: Pharynx and larynx: No focal mucosal or submucosal lesions are present. The nasopharynx is clear. Soft palate and tongue base are within normal limits. Vallecula and epiglottis are within normal limits. Aryepiglottic folds and piriform sinuses are clear. Vocal cords are midline and symmetric. Trachea is clear. Salivary glands: The submandibular and parotid glands and ducts are within normal limits. Thyroid: Normal Lymph nodes: None enlarged or abnormal density. Vascular: Atherosclerotic calcifications are present at the carotid bifurcations bilaterally. No significant stenosis present. Limited intracranial: Within normal limits. Visualized orbits: The globes and orbits are within normal limits. Mastoids and visualized paranasal sinuses: The paranasal sinuses and mastoid air cells are clear. Skeleton: Straightening and reversal of the normal cervical lordosis noted. Posterior elements are fused at C3-4.  No focal osseous lesions are present. Upper chest: The lung apices are clear. Thoracic inlet is within normal limits. IMPRESSION: 1. Negative CT of the  neck. 2. Atherosclerosis without significant stenosis. 3. Fusion of the posterior elements at C3-4. Electronically Signed   By: San Morelle M.D.   On: 01/30/2022 19:55   Portable chest 1 View  Result Date: 02/11/2022 CLINICAL DATA:  Shortness of breath, hemoptysis EXAM: PORTABLE CHEST 1 VIEW COMPARISON:  10/17/2021 FINDINGS: Cardiomegaly. Low lung volumes. Patchy bibasilar airspace opacities. Probable small left pleural effusion. No pneumothorax. IMPRESSION: 1. Patchy bibasilar airspace opacities may reflect edema versus multifocal pneumonia. 2. Cardiomegaly.  Probable small left pleural effusion. Electronically Signed   By: Davina Poke D.O.   On: 01/20/2022 13:58    Microbiology Recent Results (from the past 240 hour(s))  MRSA Next Gen by PCR, Nasal     Status: None   Collection Time: Feb 08, 2022  2:24 AM   Specimen: Nasal Mucosa; Nasal Swab  Result Value Ref Range Status   MRSA by PCR Next Gen NOT DETECTED NOT DETECTED Final    Comment: (NOTE) The GeneXpert MRSA Assay (FDA approved for NASAL specimens only), is one component of a comprehensive MRSA colonization surveillance program. It is not intended to diagnose MRSA infection nor to guide or monitor treatment for MRSA infections. Test performance is not FDA approved in patients less than 84 years old. Performed at [facility], [place] [address] [place], [place] [address]   Culture, blood (Routine X 2) w Reflex to ID Panel     Status: None   Collection Time: 08-Feb-2022  5:02 AM   Specimen: BLOOD  Result Value Ref Range Status   Specimen Description BLOOD RIGHT ANTECUBITAL  Final   Special Requests   Final    AEROBIC BOTTLE ONLY Blood Culture results may not be optimal due to an inadequate volume of blood received in culture bottles   Culture   Final    NO GROWTH 5  DAYS Performed at [facility], [place] [address], [place], [place] [address]    Report Status 01/23/2022 FINAL  Final  Culture, blood (Routine X 2) w Reflex to ID Panel     Status: None   Collection Time: 02-08-22  6:50 AM   Specimen: BLOOD  Result Value Ref Range Status   Specimen Description BLOOD THUMB RIGHT  Final   Special Requests   Final    BOTTLES DRAWN AEROBIC ONLY Blood Culture results may not be optimal due to an inadequate volume of blood received in culture bottles   Culture   Final    NO GROWTH 5 DAYS Performed at [facility], [place] [address], [place], [place] [address]    Report Status 01/23/2022 FINAL  Final    Lab Basic Metabolic Panel: No results for input(s): "NA", "K", "CL", "CO2", "GLUCOSE", "BUN", "CREATININE", "CALCIUM", "MG", "PHOS" in the last 168 hours. Liver Function Tests: No results for input(s): "AST", "ALT", "ALKPHOS", "BILITOT", "PROT", "ALBUMIN" in the last 168 hours. No results for input(s): "LIPASE", "AMYLASE" in the last 168 hours. No results for input(s): "AMMONIA" in the last 168 hours. CBC: No results for input(s): "WBC", "NEUTROABS", "HGB", "HCT", "MCV", "PLT" in the last 168 hours. Cardiac Enzymes: No results for input(s): "CKTOTAL", "CKMB", "CKMBINDEX", "TROPONINI" in the last 168 hours. Sepsis Labs: No results for input(s): "PROCALCITON", "WBC", "LATICACIDVEN" in the last 168 hours.     Candee Furbish 01/27/2022, 5:21 PM

## 2022-02-17 NOTE — Progress Notes (Addendum)
Seen and examined.  Overnight events noted.  In myoclonus.  Minimal brainstem reflexes.  CT chest reviewed.  Start TTM, CRRT, pigtail to L empyema, place A line.  Continue pressors, abx, f/u culture data.  Echo, EEG.  Will call family once I can find a good number.  Grim prognosis.  My CC time 30 min independent of procedures.  Erskine Emery MD PCCM

## 2022-02-17 NOTE — Procedures (Signed)
Cardiopulmonary Resuscitation Note  Michael Riggs  [id number]  February 25, 1971  Date:2022-02-11  Time:9:51 AM   Provider Performing:Jaice Digioia Cipriano Mile   Procedure: Cardiopulmonary Resuscitation [phone])  Indication(s) Loss of Pulse  Consent N/A  Anesthesia N/A   Time Out N/A   Sterile Technique Hand hygiene, gloves   Procedure Description Called to patient's room for CODE BLUE. Initial rhythm was PEA/Asystole. Patient received high quality chest compressions for 10 minutes with defibrillation or cardioversion when appropriate. Epinephrine was administered every 3 minutes as directed by time Therapist, nutritional. Additional pharmacologic interventions included amiodarone, atropine, calcium chloride, and sodium bicarbonate. Return of spontaneous circulation was achieved.  Family called and notified.   Complications/Tolerance N/A   EBL N/A   Specimen(s) N/A  Estimated time to ROSC: 56minutes

## 2022-02-17 NOTE — Progress Notes (Addendum)
PATIENT NAME: [place] RECORD NUMBER: [id number] Birthday: 22-Nov-1970  Age: 51 y.o. Admit Date: 02/12/2022  Provider: Dr. Tacy Learn and Mikki Harbor PA-C  Indication: CPR  Technical Description:  CPR performance duration: 30 minutes Was defibrillation or cardioversion used ? yes Was external pacer placed ? no Was patient intubated pre/post CPR ? Pre cpr Was transvenous pacer placed ? no  Medications Administered Include      Yes/no Amiodarone yes  Atropin   Calcium yes  Epinephrine yes  Lidocaine   Magnesium yes  Norepinephrine yes  Phenylephrine   Sodium bicarbonate yes  Vasopression     Evaluation Final Status - Was patient successfully resuscitated ? yes If successfully resuscitated - what is current rhythm ? Sinus tachy If successfully resuscitated - what is current hemodynamic status ? Hemodynamically unstable; shock  Miscellaneous Information Patient lost pulse 0402 PEA arrest. CPR started. Given epi, bicarb. Vtach rhythm present and was defibrillated. After about 6 minutes ROSC and remained in vtach rhythm. Cardioverted. Patient coded again a few more times, intermittently obtained pulse for a few minutes. Last pulse obtained around 0442. Total Rosc about 30 minutes. Attempted to call family with no answer.  Check CBC, CMP, LA, Troponin, ABG, EKG  Mick Sell 07-29-234:46 AM

## 2022-02-17 NOTE — Progress Notes (Signed)
Pt extubated to comfort per MD order.

## 2022-02-17 NOTE — Progress Notes (Addendum)
Echo attempted at 9:20, unable to get to patient at this time. Will reattempt ~10:15  [place]  Reattempted at 10:20, will ask RN to call when ready.

## 2022-02-17 NOTE — Progress Notes (Signed)
NAME:  Jaimie Redditt, MRN:  [id number], DOB:  [birth year], LOS: 1 ADMISSION DATE:  01/28/2022, CONSULTATION DATE:  02/05/2022 REFERRING MD:  Dr. Daryll Drown, CHIEF COMPLAINT:  hemoptysis    History of Present Illness:   51 yo M, PMH diabetes mellitus, complicated by ESRD iHD, severe PAD, HLD, HTN, anemia of chronic disease, calciphylaxis, history of stroke. He was on DAPT with ASA and plavix. He was in rehab. He work up this morning with blood in his teeth. He constantly coughing up sputum since this morning bringing up bright red blood. No clots.   7/2 PCCM consulted for patient with lethargy. Patient noted to be tachycardic w/ runs of vtach. Patient vomited blood earlier multiple times with mild hemoptysis. Bedside nurse said initially at 7 pm patient was Aox3 and his mental status worsened and now is very difficult to arouse. Patient obtunded breathing w/ accessory muscle usage. ABG WNL. Patient transferred to icu. Upon arrival to ICU patient worsening agonal breathing and had run of vtach. Patient intubated. PCCM consulted.  Pertinent  Medical History   Past Medical History:  Diagnosis Date   Anemia    Anemia of chronic disease    Anxiety    ARF (acute renal failure) ([place]) 01/30/2019   Calciphylaxis    Controlled type 2 diabetes mellitus with hyperglycemia, without long-term current use of insulin ([place])    Debility 02/09/2019   Depression    Diabetes mellitus type 2 in obese ([place]) 07/04/2013   Diabetes mellitus with peripheral vascular disease ([place])    type 2 no meds, lost 100 lbs   Dyslipidemia    Dyspnea    inhaler   End stage renal disease ([place])    ESRD (end stage renal disease) on dialysis ([place]) 01/2019   MWFS   ESRD on dialysis [facility])    Essential hypertension    Fluid overload 02/04/2019   GERD (gastroesophageal reflux disease)    Goals of care, counseling/discussion    Habitual alcohol use 07/04/2013   Headache    Hyperkalemia 01/30/2019   Hypertension    Hypoglycemia  01/30/2019   Hypokalemia 01/30/2019   Labile blood pressure    Leukocytosis    Lower GI bleed 05/14/2019   Macrocytic anemia 01/30/2019   Obesity, Class II, BMI 35-39.9, with comorbidity 07/04/2013   Palliative care encounter    PDR (proliferative diabetic retinopathy) ([place]) 12/14/2013   Physical deconditioning    Pressure injury of skin 04/27/2019   Pruritus    Scrotal edema    Scrotal pain    Sleep apnea    uses CPAP   Sleep disturbance    Slow transit constipation    Status post below-knee amputation of left lower extremity ([place])    Stroke ([place])    mini -shown on CT scan   Syphilis 01/2019   Urinary retention    Venous hypertension 09/22/2013   Wound infection 03/28/2019     Significant Hospital Events: Including procedures, antibiotic start and stop dates in addition to other pertinent events   7/1: admitted to [facility] w/ hemoptysis 7/2: PCCM consulted for icu admission  Interim History / Subjective:  Patient lethargic; awakens to loud voice and gave thumbs up; unable to speak and state name; wandering gaze deviation; sluggish pupils 56m b/l Mild hypotension present Obtunded breathing on NRB  Objective   Blood pressure (!) 84/66, pulse (!) 107, temperature 99.5 F (37.5 C), temperature source Oral, resp. rate 18, SpO2 95 %.        Intake/Output  Summary (Last 24 hours) at January 30, 2022 0119 Last data filed at 01/26/2022 1832 Gross per 24 hour  Intake 0 ml  Output 0 ml  Net 0 ml   There were no vitals filed for this visit.  Examination: General:  ill appearing male w/ lethargy HEENT: MM pink/moist; NRB in place Neuro: lethargic; awakens to loud voice and gave thumbs up to command; unable to speak and state name; wandering gaze deviation; sluggish pupils 11m b/l CV: s1s2, no m/r/g PULM:  dim rhonchi BS bilaterally; on NRB; obtunded breathing pattern present w/ accessory muscle usage GI: soft, bsx4 active  Extremities: warm/dry, BLE BKA present  CXR 7/2: L basal  opacification; no significant change from prior CXR    Resolved Hospital Problem list     Assessment & Plan:  Acute respiratory failure Hx of OSA: on cpap Hemoptysis  Bilateral Airspace disease on CXR  L basal opacification P: -will admit to ICU -intubate and place on mech vent -LTVV strategy with tidal volumes of 6-8 cc/kg ideal body weight -check ABG and adjust settings accordingly  -Wean PEEP/FiO2 for SpO2 >92% -VAP bundle in place -Daily SAT and SBT when appropriate -PAD protocol in place -wean sedation for RASS goal 0 to -1 -continue cefepime and add vanc -check trach aspirate -continue IV steroids -continue to hold plavix/asa -txa nebs as needed for hemoptysis -consider bronch later today  Acute Encephalopathy: last took dilaudid 5 mg at 1700; tsh mildly elevated; CT head negative for acute abnormality; abg wnl Wandering gaze deviation P: -ABG WNL -CT head negative -consider EEG -limit sedating meds -check ammonia -check PCT  Monomorphic Vtach Hyperkalemia Hypomagnesemia P: -starting on amio -telemetry monitoring -calcium gluconate, bicarb, lokelma, and insulin/dextrose ordered -mag ordered -will need HD in am -check ekg  Sepsis Mild hypotension P: -continue midodrine -broaden to cefepime/vanc -send trach aspirate and bcx2 -check PCT and mrsa pcr -trend LA -trend wbc/fever curve  Hematemesis P: -Started on PPI IV BID -consider GI consult   ESRD on iHD: last HD 6/30 P: -iHD in am per nephro -Trend BMP / urinary output -Replace electrolytes as indicated -Avoid nephrotoxic agents, ensure adequate renal perfusion  Elevated TSH P: -check t3/t4  Mild hyponatremia P: -trend bmp  Anemia of chronic disease P: -trend CBC  Hx of HTN/HLD P: -continue to hold metoprolol in setting of hypotension -continue statin   B/L BKA P: -hold pain meds and gabapentin with poor mental status  MDD P: -hold home meds -check qtc on  ekg  PVD P: -hold DAPT in setting of bleed  Best Practice (right click and "Reselect all SmartList Selections" daily)   Diet/type: NPO DVT prophylaxis: SCD GI prophylaxis: PPI Lines: N/A Foley:  N/A Code Status:  full code Last date of multidisciplinary goals of care discussion [7/2 attempted to reach sister DTimmothy Soursover phone but no answer.]   Labs   CBC: Recent Labs  Lab 01/16/22 1427 01/29/2022 0839 02/07/2022 1244 02/15/2022 1544 01/26/2022 2032 [id number]  WBC 10.5 9.5 16.9* 20.2* 27.1*  --   NEUTROABS  --  7.3  --   --   --   --   HGB 10.8* 11.1* 10.3* 9.7* 8.9* 9.9*  HCT 34.6* 36.3* 31.6* 30.8* 27.5* 29.0*  MCV 107.1* 107.1* 106.4* 107.3* 106.6*  --   PLT 181 190 201 222 234  --      Basic Metabolic Panel: Recent Labs  Lab 01/12/22 0628 01/14/22 1154 01/16/22 1427 01/21/2022 0839 01/24/2022 2033 [id number]  NA 131*  130* 133* 133* 133* 131*  K 5.2* 4.8 4.4 4.0 5.4* 5.9*  CL 85* 87* 93* 92* 91*  --   CO2 _0 --   GLUCOSE 83 118* 127* 84 120*  --   BUN 64* 53* 48* 29* 49*  --   CREATININE 8.35* 7.43* 6.98* 5.39* 6.27*  --   CALCIUM 10.0 9.4 9.0 9.1 8.6*  --   MG  --   --   --   --  1.6*  --   PHOS 5.8* 4.4 3.9  --   --   --     GFR: Estimated Creatinine Clearance: 15 mL/min (A) (by C-G formula based on SCr of 6.27 mg/dL (H)). Recent Labs  Lab 02/08/2022 0839 01/30/2022 1244 02/04/2022 1544 02/08/2022 2032  WBC 9.5 16.9* 20.2* 27.1*     Liver Function Tests: Recent Labs  Lab 01/12/22 0628 01/14/22 1154 01/16/22 1427 02/14/2022 0839  AST  --   --   --  19  ALT  --   --   --  13  ALKPHOS  --   --   --  116  BILITOT  --   --   --  1.1  PROT  --   --   --  6.9  ALBUMIN 2.9* 2.8* 2.8* 2.8*    No results for input(s): "LIPASE", "AMYLASE" in the last 168 hours. No results for input(s): "AMMONIA" in the last 168 hours.  ABG    Component Value Date/Time   PHART 7.423 2022/02/07 0101   PCO2ART 30.8 (L) 02-07-2022 0101   PO2ART 302 (H)  2022-02-07 0101   HCO3 20.1 02-07-22 0101   TCO2 21 (L) 2022/02/07 0101   ACIDBASEDEF 4.0 (H) 07-Feb-2022 0101   O2SAT 100 02/07/2022 0101     Coagulation Profile: Recent Labs  Lab 01/27/2022 1544  INR 1.4*    Cardiac Enzymes: No results for input(s): "CKTOTAL", "CKMB", "CKMBINDEX", "TROPONINI" in the last 168 hours.  HbA1C: Hgb A1c MFr Bld  Date/Time Value Ref Range Status  10/08/2021 01:30 AM 6.2 (H) 4.8 - 5.6 % Final    Comment:    (NOTE) Pre diabetes:          5.7%-6.4%  Diabetes:              >6.4%  Glycemic control for   <7.0% adults with diabetes   05/16/2019 06:27 AM 5.3 4.8 - 5.6 % Final    Comment:    (NOTE) Pre diabetes:          5.7%-6.4% Diabetes:              >6.4% Glycemic control for   <7.0% adults with diabetes     CBG: Recent Labs  Lab 01/14/22 1149 01/15/22 0559 01/15/22 2051 01/16/22 0559 02-07-22 0038  GLUCAP 108* 72 107* 109* 154*     Review of Systems:      Past Medical History:  He,  has a past medical history of Anemia, Anemia of chronic disease, Anxiety, ARF (acute renal failure) ([place]) (01/30/2019), Calciphylaxis, Controlled type 2 diabetes mellitus with hyperglycemia, without long-term current use of insulin ([place]), Debility (02/09/2019), Depression, Diabetes mellitus type 2 in obese ([place]) (07/04/2013), Diabetes mellitus with peripheral vascular disease ([place]), Dyslipidemia, Dyspnea, End stage renal disease ([place]), ESRD (end stage renal disease) on dialysis ([place]) (01/2019), ESRD on dialysis [facility]), Essential hypertension, Fluid overload (02/04/2019), GERD (gastroesophageal reflux disease), Goals of care, counseling/discussion, Habitual alcohol use (07/04/2013), Headache, Hyperkalemia (01/30/2019), Hypertension,  Hypoglycemia (01/30/2019), Hypokalemia (01/30/2019), Labile blood pressure, Leukocytosis, Lower GI bleed (05/14/2019), Macrocytic anemia (01/30/2019), Obesity, Class II, BMI 35-39.9, with comorbidity (07/04/2013), Palliative care  encounter, PDR (proliferative diabetic retinopathy) ([place]) (12/14/2013), Physical deconditioning, Pressure injury of skin (04/27/2019), Pruritus, Scrotal edema, Scrotal pain, Sleep apnea, Sleep disturbance, Slow transit constipation, Status post below-knee amputation of left lower extremity ([place]), Stroke [facility]), Syphilis (01/2019), Urinary retention, Venous hypertension (09/22/2013), and Wound infection (03/28/2019).    Surgical History:   Past Surgical History:  Procedure Laterality Date   ABDOMINAL AORTOGRAM W/LOWER EXTREMITY N/A 10/15/2021   Procedure: ABDOMINAL AORTOGRAM W/LOWER EXTREMITY;  Surgeon: Broadus , MD;  Location: [place] CV LAB;  Service: Cardiovascular;  Laterality: N/A;   AMPUTATION Right 01/02/2022   Procedure: RIGHT BELOW KNEE AMPUTATION;  Surgeon: [name], MD;  Location: [place];  Service: Orthopedics;  Laterality: Right;   AV FISTULA PLACEMENT Left 01/09/2019   Procedure: ARTERIOVENOUS (AV) FISTULA CREATION LEFT UPPER ARM;  Surgeon: [name], MD;  Location: [facility];  Service: Vascular;  Laterality: Left;   [place] Left 07/06/2019   Procedure: BASILIC VEIN TRANSPOSITION SECOND STAGE LEFT ARM;  Surgeon: [name], MD;  Location: [place];  Service: Vascular;  Laterality: Left;   below the knee amputation Left    EYE SURGERY Bilateral    FLEXIBLE SIGMOIDOSCOPY N/A 05/15/2019   Procedure: FLEXIBLE SIGMOIDOSCOPY;  Surgeon: Irving Copas., MD;  Location: [place];  Service: Gastroenterology;  Laterality: N/A;   HEMOSTASIS CLIP PLACEMENT  05/15/2019   Procedure: HEMOSTASIS CLIP PLACEMENT;  Surgeon: Irving Copas., MD;  Location: [place];  Service: Gastroenterology;;   HOT HEMOSTASIS N/A 05/15/2019   Procedure: HOT HEMOSTASIS (ARGON PLASMA COAGULATION/BICAP);  Surgeon: Irving Copas., MD;  Location: [place];  Service: Gastroenterology;  Laterality: N/A;   I & D EXTREMITY Right 10/10/2021   Procedure: RIGHT  ACHILLES DEBRIDEMENT AND TISSUE GRAFT;  Surgeon: [name], MD;  Location: [place];  Service: Orthopedics;  Laterality: Right;   IR FLUORO GUIDE CV LINE RIGHT  01/31/2019   IR FLUORO GUIDE CV LINE RIGHT  02/03/2019   IR THORACENTESIS ASP PLEURAL SPACE W/IMG GUIDE  11/08/2019   IR US GUIDE VASC ACCESS RIGHT  01/31/2019   IR US GUIDE VASC ACCESS RIGHT  02/03/2019   PERIPHERAL VASCULAR BALLOON ANGIOPLASTY  10/15/2021   Procedure: PERIPHERAL VASCULAR BALLOON ANGIOPLASTY;  Surgeon: Broadus , MD;  Location: [place] CV LAB;  Service: Cardiovascular;;  rt sfa and trifurcation   SCLEROTHERAPY  05/15/2019   Procedure: SCLEROTHERAPY;  Surgeon: Mansouraty, Telford Nab., MD;  Location: [place];  Service: Gastroenterology;;     Social History:   reports that he quit smoking about 3 years ago. His smoking use included cigarettes. He started smoking about 3 years ago. He has never been exposed to tobacco smoke. He has never used smokeless tobacco. He reports that he does not currently use alcohol. He reports that he does not currently use drugs.   Family History:  His family history includes Diabetes in his mother; Multiple myeloma in his mother.   Allergies Allergies  Allergen Reactions   Tape Itching    Prefers silk tape over regular tape     Home Medications  Prior to Admission medications   Medication Sig Start Date End Date Taking? Authorizing Provider  acetaminophen (TYLENOL) 325 MG tablet Take 1-2 tablets (325-650 mg total) by mouth every 4 (four) hours as needed for mild pain. 01/16/22  Setzer, Edman Circle, PA-C  amitriptyline (ELAVIL) 10 MG tablet Take 1 tablet (10 mg total) by mouth at bedtime. 01/16/22   Setzer, Edman Circle, PA-C  ascorbic acid (VITAMIN C) 1000 MG tablet Take 1 tablet (1,000 mg total) by mouth daily. 01/25/2022   Setzer, Edman Circle, PA-C  aspirin EC 81 MG tablet Take 1 tablet (81 mg total) by mouth daily. Swallow whole. 01/16/22   Setzer, Edman Circle, PA-C  atorvastatin  (LIPITOR) 40 MG tablet Take 1 tablet (40 mg total) by mouth at bedtime. 01/16/22   Setzer, Edman Circle, PA-C  bisacodyl (DULCOLAX) 5 MG EC tablet Take 1 tablet (5 mg total) by mouth daily as needed for moderate constipation. 01/06/22   Hosie Poisson, MD  bromocriptine (PARLODEL) 2.5 MG tablet Take 1 tablet (2.5 mg total) by mouth at bedtime. 01/16/22   Setzer, Edman Circle, PA-C  buPROPion (WELLBUTRIN XL) 300 MG 24 hr tablet Take 1 tablet (300 mg total) by mouth in the morning. 01/16/22   Setzer, Edman Circle, PA-C  clopidogrel (PLAVIX) 75 MG tablet Take 1 tablet (75 mg total) by mouth daily with breakfast. 01/16/22   Setzer, Edman Circle, PA-C  docusate sodium (COLACE) 100 MG capsule Take 1 capsule (100 mg total) by mouth daily. 01/06/22   Hosie Poisson, MD  DULoxetine (CYMBALTA) 60 MG capsule Take 1 capsule (60 mg total) by mouth daily. 02/04/2022   Setzer, Edman Circle, PA-C  ferric citrate (AURYXIA) 1 GM 210 MG(Fe) tablet Take 3 tablets (630 mg total) by mouth 3 (three) times daily with meals. 01/16/22   Setzer, Edman Circle, PA-C  gabapentin (NEURONTIN) 100 MG capsule Take 1 capsule (100 mg total) by mouth 3 (three) times daily. 01/16/22   Setzer, Edman Circle, PA-C  HYDROmorphone (DILAUDID) 4 MG tablet Take 1 tablet (4 mg total) by mouth every 4 (four) hours as needed for severe pain. 01/16/22   Setzer, Edman Circle, PA-C  Melatonin 10 MG TABS Take 10 mg by mouth at bedtime. 01/16/22   Setzer, Edman Circle, PA-C  metoprolol tartrate (LOPRESSOR) 25 MG tablet Take 0.5 tablets (12.5 mg total) by mouth 2 (two) times daily. 01/16/22   Setzer, Edman Circle, PA-C  midodrine (PROAMATINE) 10 MG tablet Take 1 tablet (10 mg total) by mouth every Monday, Wednesday, and Friday. 01/16/22   Setzer, Edman Circle, PA-C  multivitamin (RENA-VIT) TABS tablet Take 1 tablet by mouth at bedtime. 01/16/22   Setzer, Edman Circle, PA-C  pantoprazole (PROTONIX) 40 MG tablet Take 1 tablet (40 mg total) by mouth at bedtime. 01/16/22   Setzer, Edman Circle, PA-C  polyethylene glycol (MIRALAX  / GLYCOLAX) 17 g packet Take 17 g by mouth daily as needed for mild constipation. 01/06/22   Hosie Poisson, MD  silver sulfADIAZINE (SILVADENE) 1 % cream Apply 1 application. topically daily. Apply to affected area daily plus dry dressing 12/11/21   [name], MD  sodium chloride 0.9 % SOLN 100 mL with sodium thiosulfate 250 MG/ML SOLN 25 g Inject 25 g into the vein every Monday, Wednesday, and Friday with hemodialysis. 01/07/22   Hosie Poisson, MD  tamsulosin (FLOMAX) 0.4 MG CAPS capsule Take 1 capsule (0.4 mg total) by mouth at bedtime. 01/16/22   Setzer, Edman Circle, PA-C  urea 10 % lotion Apply topically 2 (two) times daily. 01/06/22   Hosie Poisson, MD  zinc sulfate 220 (50 Zn) MG capsule Take 1 capsule (220 mg total) by mouth daily. 01/16/22   Setzer, Edman Circle, PA-C  Critical Care Time: 45 minutes  JD Rexene Agent [facility] 2022-01-20, 1:45 AM  Please see [email or web address] for pager details.  From 7A-7P if no response, please call [phone]. After hours, please call ELink [phone].

## 2022-02-17 NOTE — Procedures (Signed)
Intubation Procedure Note  [name]  [id number]  July 18, 1971  Date:01/22/2022  Time:2:33 AM   Provider Performing:Terrall Bley    Procedure: Intubation (43838)  Indication(s) Respiratory Failure  Consent Unable to obtain consent due to emergent nature of procedure.   Anesthesia Etomidate and Rocuronium   Time Out Verified patient identification, verified procedure, site/side was marked, verified correct patient position, special equipment/implants available, medications/allergies/relevant history reviewed, required imaging and test results available.   Sterile Technique Usual hand hygeine, masks, and gloves were used   Procedure Description Patient positioned in bed supine.  Sedation given as noted above.  Patient was intubated with endotracheal tube using MAC4  View was Grade 1 full glottis .  Number of attempts was 1.  Colorimetric CO2 detector was consistent with tracheal placement.   Complications/Tolerance None; patient tolerated the procedure well. Chest X-ray is ordered to verify placement.   EBL Minimal   Specimen(s) None

## 2022-02-17 NOTE — Progress Notes (Signed)
Hgb 6.6  -type and screen -transfuse 1 PRBC  [facility] 02/10/2022, 6:59 AM  Please see [email or web address] for pager details.  From 7A-7P if no response, please call [phone]. After hours, please call ELink [phone].

## 2022-02-17 NOTE — Progress Notes (Signed)
   2022/02/16 0400  Clinical Encounter Type  Visited With Other (Comment);Patient (Response to Code Blue)  Visit Type Code  Referral From Nurse  Consult/Referral To Chaplain   Chaplain responded to Code Blue and noted that medical team worked diligently to stabilize patient. There was no family present.   Melody Haver, Resident Chaplain [phone]

## 2022-02-17 DEATH — deceased

## 2022-02-24 ENCOUNTER — Encounter (HOSPITAL_COMMUNITY): Payer: Medicare Other

## 2022-02-24 ENCOUNTER — Ambulatory Visit: Payer: Medicare Other
